# Patient Record
Sex: Male | Born: 1945 | Race: Black or African American | Hispanic: No | Smoking: Former smoker
Health system: Southern US, Community
[De-identification: ages and names within clinical notes are randomized; demographics above are authoritative.]

## PROBLEM LIST (undated history)

## (undated) DIAGNOSIS — E7800 Pure hypercholesterolemia, unspecified: Secondary | ICD-10-CM

## (undated) DIAGNOSIS — I480 Paroxysmal atrial fibrillation: Secondary | ICD-10-CM

## (undated) DIAGNOSIS — Z9689 Presence of other specified functional implants: Secondary | ICD-10-CM

## (undated) DIAGNOSIS — F419 Anxiety disorder, unspecified: Secondary | ICD-10-CM

## (undated) DIAGNOSIS — I429 Cardiomyopathy, unspecified: Secondary | ICD-10-CM

## (undated) DIAGNOSIS — J111 Influenza due to unidentified influenza virus with other respiratory manifestations: Secondary | ICD-10-CM

## (undated) DIAGNOSIS — N183 Chronic kidney disease, stage 3 unspecified: Secondary | ICD-10-CM

## (undated) DIAGNOSIS — I2510 Atherosclerotic heart disease of native coronary artery without angina pectoris: Secondary | ICD-10-CM

## (undated) DIAGNOSIS — N529 Male erectile dysfunction, unspecified: Secondary | ICD-10-CM

## (undated) DIAGNOSIS — D369 Benign neoplasm, unspecified site: Secondary | ICD-10-CM

## (undated) DIAGNOSIS — F32A Depression, unspecified: Secondary | ICD-10-CM

## (undated) DIAGNOSIS — E669 Obesity, unspecified: Secondary | ICD-10-CM

## (undated) DIAGNOSIS — I10 Essential (primary) hypertension: Secondary | ICD-10-CM

## (undated) DIAGNOSIS — M109 Gout, unspecified: Secondary | ICD-10-CM

## (undated) DIAGNOSIS — K219 Gastro-esophageal reflux disease without esophagitis: Secondary | ICD-10-CM

## (undated) DIAGNOSIS — I472 Ventricular tachycardia: Secondary | ICD-10-CM

## (undated) DIAGNOSIS — E119 Type 2 diabetes mellitus without complications: Secondary | ICD-10-CM

## (undated) DIAGNOSIS — N400 Enlarged prostate without lower urinary tract symptoms: Secondary | ICD-10-CM

## (undated) DIAGNOSIS — N186 End stage renal disease: Secondary | ICD-10-CM

## (undated) DIAGNOSIS — F17201 Nicotine dependence, unspecified, in remission: Secondary | ICD-10-CM

## (undated) DIAGNOSIS — I219 Acute myocardial infarction, unspecified: Secondary | ICD-10-CM

## (undated) DIAGNOSIS — R42 Dizziness and giddiness: Secondary | ICD-10-CM

## (undated) DIAGNOSIS — G629 Polyneuropathy, unspecified: Secondary | ICD-10-CM

## (undated) DIAGNOSIS — M1990 Unspecified osteoarthritis, unspecified site: Secondary | ICD-10-CM

## (undated) DIAGNOSIS — K649 Unspecified hemorrhoids: Secondary | ICD-10-CM

## (undated) DIAGNOSIS — I209 Angina pectoris, unspecified: Secondary | ICD-10-CM

## (undated) DIAGNOSIS — I4729 Other ventricular tachycardia: Secondary | ICD-10-CM

## (undated) DIAGNOSIS — F329 Major depressive disorder, single episode, unspecified: Secondary | ICD-10-CM

## (undated) HISTORY — PX: THUMB AMPUTATION: SHX804

## (undated) HISTORY — PX: JOINT REPLACEMENT: SHX530

## (undated) HISTORY — DX: Chronic kidney disease, stage 3 unspecified: N18.30

## (undated) HISTORY — DX: Unspecified hemorrhoids: K64.9

## (undated) HISTORY — DX: Ventricular tachycardia: I47.2

## (undated) HISTORY — PX: CORONARY ANGIOPLASTY: SHX604

## (undated) HISTORY — DX: Nicotine dependence, unspecified, in remission: F17.201

## (undated) HISTORY — DX: Essential (primary) hypertension: I10

## (undated) HISTORY — DX: Male erectile dysfunction, unspecified: N52.9

## (undated) HISTORY — DX: Chronic kidney disease, stage 3 (moderate): N18.3

## (undated) HISTORY — PX: HERNIA REPAIR: SHX51

## (undated) HISTORY — DX: Obesity, unspecified: E66.9

## (undated) HISTORY — DX: Unspecified osteoarthritis, unspecified site: M19.90

## (undated) HISTORY — PX: CARDIAC CATHETERIZATION: SHX172

## (undated) HISTORY — DX: Benign prostatic hyperplasia without lower urinary tract symptoms: N40.0

## (undated) HISTORY — DX: Benign neoplasm, unspecified site: D36.9

## (undated) HISTORY — DX: Gout, unspecified: M10.9

## (undated) HISTORY — DX: Other ventricular tachycardia: I47.29

---

## 2001-06-05 ENCOUNTER — Encounter: Payer: Self-pay | Admitting: *Deleted

## 2001-06-05 ENCOUNTER — Ambulatory Visit (HOSPITAL_COMMUNITY): Admission: RE | Admit: 2001-06-05 | Discharge: 2001-06-05 | Payer: Self-pay | Admitting: *Deleted

## 2001-07-30 ENCOUNTER — Encounter: Payer: Self-pay | Admitting: Family Medicine

## 2001-07-30 ENCOUNTER — Ambulatory Visit (HOSPITAL_COMMUNITY): Admission: RE | Admit: 2001-07-30 | Discharge: 2001-07-30 | Payer: Self-pay | Admitting: Family Medicine

## 2001-10-09 ENCOUNTER — Encounter (HOSPITAL_COMMUNITY): Admission: RE | Admit: 2001-10-09 | Discharge: 2001-11-08 | Payer: Self-pay | Admitting: Neurosurgery

## 2001-11-09 ENCOUNTER — Encounter: Admission: RE | Admit: 2001-11-09 | Discharge: 2001-11-09 | Payer: Self-pay | Admitting: Neurosurgery

## 2002-03-22 ENCOUNTER — Inpatient Hospital Stay (HOSPITAL_COMMUNITY): Admission: RE | Admit: 2002-03-22 | Discharge: 2002-03-30 | Payer: Self-pay | Admitting: Neurosurgery

## 2002-03-31 ENCOUNTER — Emergency Department (HOSPITAL_COMMUNITY): Admission: EM | Admit: 2002-03-31 | Discharge: 2002-03-31 | Payer: Self-pay | Admitting: Emergency Medicine

## 2002-03-31 ENCOUNTER — Encounter: Payer: Self-pay | Admitting: Emergency Medicine

## 2003-03-07 ENCOUNTER — Ambulatory Visit (HOSPITAL_COMMUNITY): Admission: RE | Admit: 2003-03-07 | Discharge: 2003-03-07 | Payer: Self-pay | Admitting: Internal Medicine

## 2003-03-07 ENCOUNTER — Encounter: Payer: Self-pay | Admitting: Internal Medicine

## 2003-03-25 ENCOUNTER — Ambulatory Visit (HOSPITAL_COMMUNITY): Admission: RE | Admit: 2003-03-25 | Discharge: 2003-03-25 | Payer: Self-pay | Admitting: Urology

## 2004-08-21 ENCOUNTER — Ambulatory Visit (HOSPITAL_COMMUNITY): Admission: RE | Admit: 2004-08-21 | Discharge: 2004-08-21 | Payer: Self-pay | Admitting: Internal Medicine

## 2005-01-22 ENCOUNTER — Ambulatory Visit (HOSPITAL_COMMUNITY): Admission: RE | Admit: 2005-01-22 | Discharge: 2005-01-22 | Payer: Self-pay | Admitting: Family Medicine

## 2005-04-27 ENCOUNTER — Encounter: Admission: RE | Admit: 2005-04-27 | Discharge: 2005-04-27 | Payer: Self-pay | Admitting: Neurosurgery

## 2005-05-23 ENCOUNTER — Encounter (HOSPITAL_COMMUNITY): Admission: RE | Admit: 2005-05-23 | Discharge: 2005-06-22 | Payer: Self-pay | Admitting: Neurosurgery

## 2005-07-11 ENCOUNTER — Ambulatory Visit: Payer: Self-pay | Admitting: Cardiology

## 2005-07-31 ENCOUNTER — Ambulatory Visit: Payer: Self-pay | Admitting: *Deleted

## 2005-10-08 ENCOUNTER — Encounter: Admission: RE | Admit: 2005-10-08 | Discharge: 2005-10-08 | Payer: Self-pay | Admitting: Neurosurgery

## 2005-10-11 ENCOUNTER — Ambulatory Visit: Payer: Self-pay | Admitting: *Deleted

## 2005-12-23 HISTORY — PX: BACK SURGERY: SHX140

## 2006-01-20 ENCOUNTER — Ambulatory Visit (HOSPITAL_COMMUNITY): Admission: RE | Admit: 2006-01-20 | Discharge: 2006-01-20 | Payer: Self-pay | Admitting: Neurosurgery

## 2006-03-07 ENCOUNTER — Ambulatory Visit: Payer: Self-pay | Admitting: Cardiology

## 2006-03-10 ENCOUNTER — Inpatient Hospital Stay (HOSPITAL_COMMUNITY): Admission: RE | Admit: 2006-03-10 | Discharge: 2006-03-14 | Payer: Self-pay | Admitting: Neurosurgery

## 2006-08-04 ENCOUNTER — Encounter (INDEPENDENT_AMBULATORY_CARE_PROVIDER_SITE_OTHER): Payer: Self-pay | Admitting: *Deleted

## 2006-08-04 LAB — CONVERTED CEMR LAB
AST: 14 units/L
Albumin: 4 g/dL
BUN: 18 mg/dL
CO2: 23 meq/L
Calcium: 9 mg/dL
Glucose, Bld: 108 mg/dL
HCT: 37.4 %
Hemoglobin: 12.4 g/dL
MCV: 96.1 fL
Potassium: 4.1 meq/L
TSH: 0.69 microintl units/mL
Triglycerides: 115 mg/dL

## 2007-01-08 ENCOUNTER — Ambulatory Visit: Payer: Self-pay | Admitting: Cardiology

## 2007-05-04 ENCOUNTER — Encounter (INDEPENDENT_AMBULATORY_CARE_PROVIDER_SITE_OTHER): Payer: Self-pay | Admitting: Specialist

## 2007-05-04 ENCOUNTER — Ambulatory Visit (HOSPITAL_COMMUNITY): Admission: RE | Admit: 2007-05-04 | Discharge: 2007-05-04 | Payer: Self-pay | Admitting: Podiatry

## 2008-02-16 ENCOUNTER — Ambulatory Visit: Payer: Self-pay | Admitting: Cardiology

## 2008-07-13 ENCOUNTER — Ambulatory Visit (HOSPITAL_COMMUNITY): Admission: RE | Admit: 2008-07-13 | Discharge: 2008-07-13 | Payer: Self-pay | Admitting: Internal Medicine

## 2009-01-11 ENCOUNTER — Ambulatory Visit (HOSPITAL_COMMUNITY): Admission: RE | Admit: 2009-01-11 | Discharge: 2009-01-11 | Payer: Self-pay | Admitting: Internal Medicine

## 2009-02-15 ENCOUNTER — Emergency Department (HOSPITAL_COMMUNITY): Admission: EM | Admit: 2009-02-15 | Discharge: 2009-02-15 | Payer: Self-pay | Admitting: Emergency Medicine

## 2009-02-17 ENCOUNTER — Ambulatory Visit: Payer: Self-pay | Admitting: Cardiology

## 2009-02-17 ENCOUNTER — Encounter (HOSPITAL_COMMUNITY): Admission: RE | Admit: 2009-02-17 | Discharge: 2009-03-19 | Payer: Self-pay | Admitting: Cardiology

## 2009-02-23 ENCOUNTER — Ambulatory Visit: Payer: Self-pay | Admitting: Cardiology

## 2009-02-25 ENCOUNTER — Emergency Department (HOSPITAL_COMMUNITY): Admission: EM | Admit: 2009-02-25 | Discharge: 2009-02-25 | Payer: Self-pay | Admitting: Emergency Medicine

## 2009-03-08 ENCOUNTER — Ambulatory Visit: Payer: Self-pay | Admitting: Cardiology

## 2009-03-30 ENCOUNTER — Encounter: Payer: Self-pay | Admitting: Cardiology

## 2009-03-30 ENCOUNTER — Ambulatory Visit (HOSPITAL_COMMUNITY): Admission: RE | Admit: 2009-03-30 | Discharge: 2009-03-30 | Payer: Self-pay | Admitting: Cardiology

## 2009-03-30 ENCOUNTER — Ambulatory Visit: Payer: Self-pay | Admitting: Cardiology

## 2009-04-21 ENCOUNTER — Ambulatory Visit: Payer: Self-pay | Admitting: Cardiology

## 2009-06-20 ENCOUNTER — Encounter (INDEPENDENT_AMBULATORY_CARE_PROVIDER_SITE_OTHER): Payer: Self-pay | Admitting: *Deleted

## 2009-06-27 DIAGNOSIS — M1990 Unspecified osteoarthritis, unspecified site: Secondary | ICD-10-CM | POA: Insufficient documentation

## 2009-07-06 ENCOUNTER — Encounter (INDEPENDENT_AMBULATORY_CARE_PROVIDER_SITE_OTHER): Payer: Self-pay | Admitting: *Deleted

## 2009-08-08 ENCOUNTER — Encounter: Payer: Self-pay | Admitting: Cardiology

## 2010-05-11 ENCOUNTER — Encounter (INDEPENDENT_AMBULATORY_CARE_PROVIDER_SITE_OTHER): Payer: Self-pay | Admitting: *Deleted

## 2010-05-15 ENCOUNTER — Ambulatory Visit: Payer: Self-pay | Admitting: Cardiology

## 2010-05-15 DIAGNOSIS — N529 Male erectile dysfunction, unspecified: Secondary | ICD-10-CM | POA: Insufficient documentation

## 2010-05-17 ENCOUNTER — Encounter: Payer: Self-pay | Admitting: Cardiology

## 2010-05-24 ENCOUNTER — Encounter (INDEPENDENT_AMBULATORY_CARE_PROVIDER_SITE_OTHER): Payer: Self-pay | Admitting: *Deleted

## 2010-05-24 LAB — CONVERTED CEMR LAB
Albumin: 4.1 g/dL (ref 3.5–5.2)
Alkaline Phosphatase: 75 units/L (ref 39–117)
BUN: 32 mg/dL — ABNORMAL HIGH (ref 6–23)
CO2: 21 meq/L (ref 19–32)
Calcium: 9.2 mg/dL (ref 8.4–10.5)
Chloride: 105 meq/L (ref 96–112)
Eosinophils Relative: 5 % (ref 0–5)
Glucose, Bld: 128 mg/dL — ABNORMAL HIGH (ref 70–99)
HCT: 36.1 % — ABNORMAL LOW (ref 39.0–52.0)
Hemoglobin: 11.8 g/dL — ABNORMAL LOW (ref 13.0–17.0)
Lymphocytes Relative: 20 % (ref 12–46)
Lymphs Abs: 1.2 10*3/uL (ref 0.7–4.0)
Monocytes Absolute: 0.7 10*3/uL (ref 0.1–1.0)
Monocytes Relative: 11 % (ref 3–12)
Neutro Abs: 3.8 10*3/uL (ref 1.7–7.7)
Potassium: 4.2 meq/L (ref 3.5–5.3)
RBC: 3.85 M/uL — ABNORMAL LOW (ref 4.22–5.81)
RDW: 14.4 % (ref 11.5–15.5)
Sodium: 142 meq/L (ref 135–145)
Total Protein: 7.4 g/dL (ref 6.0–8.3)
WBC: 5.9 10*3/uL (ref 4.0–10.5)

## 2010-06-20 ENCOUNTER — Telehealth (INDEPENDENT_AMBULATORY_CARE_PROVIDER_SITE_OTHER): Payer: Self-pay

## 2010-06-22 ENCOUNTER — Telehealth (INDEPENDENT_AMBULATORY_CARE_PROVIDER_SITE_OTHER): Payer: Self-pay

## 2010-08-03 ENCOUNTER — Ambulatory Visit: Payer: Self-pay | Admitting: Cardiology

## 2010-12-20 ENCOUNTER — Encounter (INDEPENDENT_AMBULATORY_CARE_PROVIDER_SITE_OTHER): Payer: Self-pay | Admitting: *Deleted

## 2011-01-13 ENCOUNTER — Encounter: Payer: Self-pay | Admitting: Family Medicine

## 2011-01-24 NOTE — Letter (Signed)
Summary: [place] [facility], general practice at [place]. [address] [place] [address]   Phone: [phone]  Fax: [phone]     May 24, 2010 MRN: [id number]   [name] [place], [place]  [address]      YOUR LAB WORK IS DUE   __________JULY 5, 2011_______________________________  Please go to [facility], located across the street from [facility] on the second floor.  Hours are Monday - Friday 7am until 7:30pm         Saturday 8am until 12noon    __  DO NOT EAT OR DRINK AFTER MIDNIGHT EVENING PRIOR TO LABWORK  _X_ YOUR LABWORK IS NOT FASTING --YOU MAY EAT PRIOR TO LABWORK

## 2011-01-24 NOTE — Miscellaneous (Signed)
Summary: labs cbcd,cmp,lipids,tsh,t4,08/04/2006  Clinical Lists Changes  Observations: Added new observation of CALCIUM: 9.0 mg/dL (08/04/2006 10:14) Added new observation of ALBUMIN: 4.0 g/dL (08/04/2006 10:14) Added new observation of PROTEIN, TOT: 7.1 g/dL (08/04/2006 10:14) Added new observation of SGPT (ALT): 18 units/L (08/04/2006 10:14) Added new observation of SGOT (AST): 14 units/L (08/04/2006 10:14) Added new observation of ALK PHOS: 74 units/L (08/04/2006 10:14) Added new observation of CREATININE: 1.12 mg/dL (08/04/2006 10:14) Added new observation of BUN: 18 mg/dL (08/04/2006 10:14) Added new observation of BG RANDOM: 108 mg/dL (08/04/2006 10:14) Added new observation of CO2 PLSM/SER: 23 meq/L (08/04/2006 10:14) Added new observation of CL SERUM: 107 meq/L (08/04/2006 10:14) Added new observation of K SERUM: 4.1 meq/L (08/04/2006 10:14) Added new observation of NA: 141 meq/L (08/04/2006 10:14) Added new observation of LDL: 84 mg/dL (08/04/2006 10:14) Added new observation of HDL: 30 mg/dL (08/04/2006 10:14) Added new observation of TRIGLYC TOT: 115 mg/dL (08/04/2006 10:14) Added new observation of CHOLESTEROL: 137 mg/dL (08/04/2006 10:14) Added new observation of PLATELETK/UL: 182 K/uL (08/04/2006 10:14) Added new observation of MCV: 96.1 fL (08/04/2006 10:14) Added new observation of HCT: 37.4 % (08/04/2006 10:14) Added new observation of HGB: 12.4 g/dL (08/04/2006 10:14) Added new observation of WBC COUNT: 5.2 10*3/microliter (08/04/2006 10:14) Added new observation of TSH: 0.690 microintl units/mL (08/04/2006 10:14) Added new observation of T4, FREE: 6.8 ng/dL (08/04/2006 10:14) Added new observation of T3 FREE: 94.8 pg/mL (08/04/2006 10:14)

## 2011-01-24 NOTE — Progress Notes (Signed)
Summary: RX question  Medications Added LEVITRA 20 MG TABS (VARDENAFIL HCL) take 1 tablet by mouth as needed       Phone Note Call from Patient   Caller: Patient Reason for Call: Talk to Nurse Summary of Call: Pt states that the RX for Cialis called in was too expensive and wants to know if he can have Levitra called into [facility] in [place] b/c it is cheaper/tg Initial call taken by: Alphonsus Sias [facility],  June 22, 2010 9:39 AM  Follow-up for Phone Call        No answer and no way to leave message. Will continue to call. Follow-up by: Jeani Hawking Via LPN,  June 23, 9727 20:60 PM  Additional Follow-up for Phone Call Additional follow up Details #1::        Pt. wants to try Levitra due to cost of Cialis. Please advise. Additional Follow-up by: Jeani Hawking Via LPN,  June 22, 1560 5:37 PM    Additional Follow-up for Phone Call Additional follow up Details #2::    Levitra 20 mg tabs, #5.  Refill as needed  Sig: one as needed Instructions to patient-try 1/2 tab initially; if unsuccessful-> one tablet.   Additional Follow-up for Phone Call Additional follow up Details #3:: Details for Additional Follow-up Action Taken: Unable to leave message on machine. Will continue to call. Additional Follow-up by: Jeani Hawking Via LPN,  June 29, 9431 76:14 AM  New/Updated Medications: LEVITRA 20 MG TABS (VARDENAFIL HCL) take 1 tablet by mouth as needed Prescriptions: CAPTOPRIL 50 MG TABS (CAPTOPRIL) TAKE 1 TAB three times a day  #90 x 6   Entered by:   Tye Savoy RN   Authorized by:   Yehuda Savannah, MD, [facility]   Signed by:   Tye Savoy RN on 06/29/2010   Method used:   Electronically to        [facility] (retail)       [address]       [place], [place]  [address]       Ph: [phone] or [phone]       Fax: [phone]   RxID:   [id number] LEVITRA 20 MG TABS (VARDENAFIL HCL) take 1 tablet by mouth as needed  #5 x 0   Entered by:   Jeani Hawking Via LPN   Authorized by:    Yehuda Savannah, MD, [facility]   Signed by:   Jeani Hawking Via LPN on 31/11/1623   Method used:   Electronically to        [facility] (retail)       [address]       [place], [place]  [address]       Ph: [phone] or [phone]       Fax: [phone]   RxID:   [id number]  No answer and no way to leave message. Will continue to call.    Jeani Hawking Via LPN  July 02, 760 5:18 AM

## 2011-01-24 NOTE — Assessment & Plan Note (Signed)
Medications Added ASPIR-LOW 81 MG TBEC (ASPIRIN) take 1 tab daily FUROSEMIDE 40 MG TABS (FUROSEMIDE) take 3 tabs 1 time daily IBUPROFEN 800 MG TABS (IBUPROFEN) take as needed ALPRAZOLAM 1 MG TABS (ALPRAZOLAM) take 1/2 tab q8hrs LORTAB 10-500 MG TABS (HYDROCODONE-ACETAMINOPHEN) take as needed CIALIS 20 MG TABS (TADALAFIL) Take 1 tablet by mouth once a day      Allergies Added: NKDA  Visit Type:  Follow-up Primary Provider:  Dr. Kerin Perna   History of Present Illness: [name] returns to the office for continued assessment and treatment of hypertension and hypertensive heart disease.  Since his last visit, he has done well from a medical standpoint.  He has not required emergency medical care.  He is exercising daily, performing water aerobics, without difficulty.  This has led to improvement in his chronic low back pain as well.  Unfortunately, his wife suffered a fatal CVA approximately 7 months ago.  Mr. Harr is still concerned about this and seems to feel that her death may have resulted from a medical error.  Overall he has felt fine.  Lifestyle is relatively sedentary, but he denies dyspnea, orthopnea, PND, chest pain, lightheadedness and syncope.  Current Medications (verified): 1)  Atenolol 50 Mg Tabs (Atenolol) .... Take 1 Tab Daily 2)  Captopril 50 Mg Tabs (Captopril) .... Take 1 Tab Three Times A Day 3)  Verapamil Hcl 80 Mg Tabs (Verapamil Hcl) .... Take 1 Tablet Three Times A Day 4)  Chlorthalidone 25 Mg Tabs (Chlorthalidone) .... Take One Tablet By Mouth Daily 5)  Aspir-Low 81 Mg Tbec (Aspirin) .... Take 1 Tab Daily 6)  Furosemide 40 Mg Tabs (Furosemide) .... Take 3 Tabs 1 Time Daily 7)  Ibuprofen 800 Mg Tabs (Ibuprofen) .... Take As Needed 8)  Alprazolam 1 Mg Tabs (Alprazolam) .... Take 1/2 Tab Q8hrs 9)  Lortab 10-500 Mg Tabs (Hydrocodone-Acetaminophen) .... Take As Needed 10)  Cialis 20 Mg Tabs (Tadalafil) .... Take 1 Tablet By Mouth Once A Day  Allergies  (verified): No Known Drug Allergies  Past History:  PMH, FH, and Social History reviewed and updated.  Past Medical History: Chest heart failure with normal EF and normal coronary [phone]);       stress nuclear in 2/10-EF of 41%; no scarring or ischemia; diaphragmatic attenuation.  Pedal edema HYPERTENSION (exercise-induced) Nonsustained VT-exercise-induced Tobacco abuse: 30 pack years; discontinued 1994 OBESITY-MORBID DEGENERATIVE JOINT DISEASE (ICD-715.90) Possible benign prostatic hypertrophy; nocturia and Erectile dysfunction    Review of Systems       See history of present illness.  Vital Signs:  Patient profile:   65 year old male Height:      75 inches Weight:      304 pounds BMI:     38.13 Pulse rate:   48 / minute BP sitting:   135 / 68  (right arm)  Vitals Entered By: Doretha Sou, CNA (May 15, 2010 2:51 PM)  Physical Exam  General:    Boisterous overweight gentleman in no acute distress:   Weight-304, 12 pounds less than at his last visit Neck-No JVD; no carotid bruits: Lungs-No tachypnea, no rales; no rhonchi; no wheezes: Cardiovascular-normal PMI; normal S1 and S2; S4 present Abdomen-BS normal; soft and non-tender without masses or organomegaly:  Musculoskeletal-No deformities, no cyanosis or clubbing: Neurologic-Normal cranial nerves; symmetric strength and tone:  Skin-Warm, no significant lesions: Extremities-Nl distal pulses;1+ pretibial and ankle  edema:     Impression & Recommendations:  Problem # 1:  HYPERTENSION (ICD-401.9) Blood pressure  is under excellent control; current medications will be continued and appropriate laboratory studies obtained.  Problem # 2:  ERECTILE DYSFUNCTION, ORGANIC (ICD-607.84) Cialis samples provided at patient's request.  Problem # 3:  [place] EF (ICD-428.1) Patient has been compensated for many years since blood pressure and volume status have been controlled.  He was  congratulated on a greater than 10 pound weight loss, which also may be contributing to his heightened sense of well-being.  I will plan to reassess this very nice gentleman in one year.  I offered to review his wife's medical records with him at his convenience.  Other Orders: T-Comprehensive Metabolic Panel ([id number]) T-CBC w/Diff [phone])  Patient Instructions: 1)  Your physician recommends that you schedule a follow-up appointment in: 1 year 2)  Your physician recommends that you return for lab work in: today 3)  Your physician recommends that you continue on your current medications as directed. Please refer to the Current Medication list given to you today. Prescriptions: CIALIS 20 MG TABS (TADALAFIL) Take 1 tablet by mouth once a day  #3 x 0   Entered by:   Tye Savoy RN   Authorized by:   Yehuda Savannah, MD, [facility]   Signed by:   Tye Savoy RN on 05/15/2010   Method used:   Print then Give to Patient   RxID:   [id number]

## 2011-01-24 NOTE — Assessment & Plan Note (Signed)
Summary: TO [facility] RECORDS/TG    Primary Provider:  Dr. Kerin Perna   History of Present Illness: Mr. Cannan Beeck and I met at his request to review the medical records and his wife, who passed away while a patient at [facility].  According to my review, she suffered fatal complications of bacterial endocarditis with pulmonary, renal and predominantly neurologic compromise.  Medical care appeared appropriate.  Patient was most concerned with this sudden deterioration and his wife status when it was thought that she was close to hospital discharge.  This was the result of a cerebral embolism.  I explained that this was a complication of her underlying disease that was unpredictable and unpreventable.  He appeared sad but accepting of my explanation.  There was no charge for this discussion.  Allergies: No Known Drug Allergies

## 2011-01-24 NOTE — Letter (Signed)
Summary: [place] [facility] Work Doctor, general practice at [place]. [address], [place] [address]   Phone: [phone]  Fax: [phone]     December 20, 2010 MRN: [id number]   [name] [address] [place], [place]  [address]      YOUR LAB WORK IS DUE   December 25, 2010  Please go to [facility], located across the street from [facility] on the second floor.  Hours are Monday - Friday 7am until 7:30pm         Saturday 8am until 12noon      _X_ YOUR LABWORK IS NOT FASTING --YOU MAY EAT PRIOR TO LABWORK

## 2011-01-24 NOTE — Progress Notes (Signed)
Summary: RX called in   Phone Note Call from Patient   Caller: Patient Summary of Call: pt wants GF for Cialis called to [facility]/ pt states he only needs about 10 pills/tg Initial call taken by: Alphonsus Sias [facility],  June 20, 2010 11:55 AM    Prescriptions: CIALIS 20 MG TABS (TADALAFIL) Take 1 tablet by mouth once a day  #5 x 1   Entered by:   Jeani Hawking Via LPN   Authorized by:   Yehuda Savannah, MD, [facility]   Signed by:   Jeani Hawking Via LPN on 84/21/0312   Method used:   Electronically to        [facility] (retail)       [address]       [place], [place]  [address]       Ph: [phone] or [phone]       Fax: [phone]   RxID:   [id number]

## 2011-01-24 NOTE — Letter (Signed)
Summary: [place] [facility], general practice at [place]. [address], [place] [address]   Phone: [phone]  Fax: [phone]      May 24, 2010 MRN: [id number]   DHAIRYA CORALES [address] [place], [place]  [address]   Dear [name],  Your test ordered by Rande Lawman has been reviewed by your physician (or physician assistant) and was found to be normal or stable. Your physician (or physician assistant) felt no changes were needed at this time.  ____ Echocardiogram  ____ Cardiac Stress Test  __x__ Lab Work  ____ Peripheral vascular study of arms, legs or neck  ____ CT scan or X-ray  ____ Lung or Breathing test  ____ Other:  Based on your lab results, please discontinue your chlorthalidone at this time.  We will repeat labs in 1 mont, per Dr. Lattie Haw.  Enclosed is a copy of your labwork for your records.  Thank you, Katana Berthold Baird Cancer RN    Jacqulyn Ducking, MD, [facility] Gamer.C.Renella Cunas, MD, F.A.C.C Cristopher Peru, MD, F.A.C.C Rozann Lesches, MD, F.A.C.C Jenkins Rouge, MD, Leana Gamer.C.C

## 2011-02-14 ENCOUNTER — Emergency Department (HOSPITAL_COMMUNITY)
Admission: EM | Admit: 2011-02-14 | Discharge: 2011-02-14 | Disposition: A | Discharge status: Home / Self Care | Payer: Medicare Other | Attending: Emergency Medicine | Admitting: Emergency Medicine

## 2011-02-14 ENCOUNTER — Emergency Department (HOSPITAL_COMMUNITY)
Admission: EM | Admit: 2011-02-14 | Discharge: 2011-02-15 | Disposition: A | Discharge status: Home / Self Care | Payer: Medicare Other | Attending: Emergency Medicine | Admitting: Emergency Medicine

## 2011-02-14 DIAGNOSIS — I2510 Atherosclerotic heart disease of native coronary artery without angina pectoris: Secondary | ICD-10-CM | POA: Insufficient documentation

## 2011-02-14 DIAGNOSIS — I10 Essential (primary) hypertension: Secondary | ICD-10-CM | POA: Insufficient documentation

## 2011-02-14 DIAGNOSIS — Z79899 Other long term (current) drug therapy: Secondary | ICD-10-CM | POA: Insufficient documentation

## 2011-02-14 DIAGNOSIS — M545 Low back pain, unspecified: Secondary | ICD-10-CM | POA: Insufficient documentation

## 2011-02-15 ENCOUNTER — Emergency Department (HOSPITAL_COMMUNITY): Payer: Medicare Other

## 2011-02-15 LAB — URINALYSIS, ROUTINE W REFLEX MICROSCOPIC
Specific Gravity, Urine: 1.015 (ref 1.005–1.030)
Urine Glucose, Fasting: NEGATIVE mg/dL
Urobilinogen, UA: 0.2 mg/dL (ref 0.0–1.0)
pH: 5.5 (ref 5.0–8.0)

## 2011-02-15 LAB — URINE MICROSCOPIC-ADD ON

## 2011-04-09 LAB — POCT CARDIAC MARKERS
CKMB, poc: <1 ng/mL — ABNORMAL LOW (ref 1.0–8.0)
CKMB, poc: <1 ng/mL — ABNORMAL LOW (ref 1.0–8.0)
Myoglobin, poc: 123 ng/mL (ref 12–200)
Myoglobin, poc: 96.9 ng/mL (ref 12–200)
Troponin i, poc: <0.05 ng/mL (ref 0.00–0.09)
Troponin i, poc: <0.05 ng/mL (ref 0.00–0.09)

## 2011-04-09 LAB — BASIC METABOLIC PANEL
BUN: 12 mg/dL (ref 6–23)
CO2: 27 mEq/L (ref 19–32)
Calcium: 9 mg/dL (ref 8.4–10.5)
Chloride: 105 mEq/L (ref 96–112)
Creatinine, Ser: 1.23 mg/dL (ref 0.4–1.5)
GFR calc Af Amer: >60 mL/min (ref >60)
GFR calc non Af Amer: 59 mL/min — ABNORMAL LOW (ref >60)
Glucose, Bld: 169 mg/dL — ABNORMAL HIGH (ref 70–99)
Potassium: 3.8 mEq/L (ref 3.5–5.1)
Sodium: 140 mEq/L (ref 135–145)

## 2011-04-09 LAB — DIFFERENTIAL
Basophils Absolute: 0 10*3/uL (ref 0.0–0.1)
Basophils Relative: 0 % (ref 0–1)
Eosinophils Absolute: 0.1 10*3/uL (ref 0.0–0.7)
Eosinophils Relative: 2 % (ref 0–5)
Lymphocytes Relative: 17 % (ref 12–46)
Lymphs Abs: 0.9 10*3/uL (ref 0.7–4.0)
Monocytes Absolute: 0.5 10*3/uL (ref 0.1–1.0)
Monocytes Relative: 10 % (ref 3–12)
Neutro Abs: 3.8 10*3/uL (ref 1.7–7.7)
Neutrophils Relative %: 71 % (ref 43–77)

## 2011-04-09 LAB — CBC
HCT: 35.8 % — ABNORMAL LOW (ref 39.0–52.0)
Hemoglobin: 12.3 g/dL — ABNORMAL LOW (ref 13.0–17.0)
MCHC: 34.3 g/dL (ref 30.0–36.0)
MCV: 96.1 fL (ref 78.0–100.0)
Platelets: 148 10*3/uL — ABNORMAL LOW (ref 150–400)
RBC: 3.73 MIL/uL — ABNORMAL LOW (ref 4.22–5.81)
RDW: 13.9 % (ref 11.5–15.5)
WBC: 5.4 10*3/uL (ref 4.0–10.5)

## 2011-04-09 LAB — GLUCOSE, CAPILLARY: Glucose-Capillary: 174 mg/dL — ABNORMAL HIGH (ref 70–99)

## 2011-04-30 ENCOUNTER — Encounter (HOSPITAL_COMMUNITY): Payer: Self-pay

## 2011-04-30 ENCOUNTER — Other Ambulatory Visit (HOSPITAL_COMMUNITY): Payer: Self-pay | Admitting: Internal Medicine

## 2011-04-30 ENCOUNTER — Ambulatory Visit (HOSPITAL_COMMUNITY)
Admission: RE | Admit: 2011-04-30 | Discharge: 2011-04-30 | Disposition: A | Discharge status: Home / Self Care | Payer: Medicare Other | Source: Ambulatory Visit | Attending: Internal Medicine | Admitting: Internal Medicine

## 2011-04-30 DIAGNOSIS — M79609 Pain in unspecified limb: Secondary | ICD-10-CM | POA: Insufficient documentation

## 2011-04-30 DIAGNOSIS — S91109A Unspecified open wound of unspecified toe(s) without damage to nail, initial encounter: Secondary | ICD-10-CM

## 2011-04-30 DIAGNOSIS — R937 Abnormal findings on diagnostic imaging of other parts of musculoskeletal system: Secondary | ICD-10-CM | POA: Insufficient documentation

## 2011-04-30 DIAGNOSIS — X58XXXA Exposure to other specified factors, initial encounter: Secondary | ICD-10-CM | POA: Insufficient documentation
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
## 2011-05-07 NOTE — Letter (Signed)
February 16, 2008    [name], M.D.  [address] [place],  [place] [address]   RE:  [name], [name]  MRN:  [id number]  /  DOB:  1946-05-26   Dear [name]:   [name] returns to the office as scheduled for continued assessment  and treatment of a history of congestive heart failure with preserved  left ventricular systolic function.  This occurred many years ago and  appeared to be related to diastolic dysfunction.  In any case, he has  done very well in recent years.  His principle problems have been  orthopedic.  He was advised to lose weight and has dropped 40 pounds  with significant improvement in back pain and sense of well-being.  He  has erectile dysfunction and is requesting treatment.  He does not use  nitrates.   Current medications include verapamil 80 mg t.i.d., captopril 50 mg  t.i.d., diclofenac 75 mg b.i.d. and Tenormin 50 mg daily.   On exam, pleasant husky gentleman with a halting speech pattern in no  acute distress.  The weight is 287, 41 pounds less than in January 2008.  Blood pressure 120/80, heart rate 52 and regular, respirations 14.  NECK:  No jugular venous distention; normal carotid upstrokes without  bruits.  LUNGS:  Clear.  CARDIAC:  Normal first and second heart sounds.  Minimal systolic  murmur.  ABDOMEN:  Soft and nontender; normal bowel sounds; no bruits; aortic  pulsation not palpable.  EXTREMITIES:  No edema; distal pulses intact.   Recent laboratory is supplied from your office.  Lipid profile,  chemistry profile and CBC were all excellent.   Rhythm strip:  Sinus bradycardia at a rate of 52; borderline first-  degree AV block.   IMPRESSION:  [name] is doing beautifully.  Due to sinus bradycardia,  his dose of Tenormin will be reduced to 25 mg daily.  We provided him  with samples of Cialis.  I will plan see this nice gentleman again in 1  year.    Sincerely,      Cristopher Estimable. Lattie Haw, MD, [facility]  Electronically  Signed    RMR/MedQ  DD: 02/16/2008  DT: 02/16/2008  Job #: [id number]

## 2011-05-07 NOTE — Letter (Signed)
February 23, 2009    Elsie [address], [place]  [place], [place]  [address]   RE:  [name], [name]  MRN:  [id number]  /  DOB:  05/04/1946   Dear Rush Landmark:   [name] is seen in the office after recent emergency department  visit.  The ER doctor evaluated him for chest discomfort, but the  patient claims weakness after exercising in the pool.  He ate a candy  bar and rapidly recovered back towards normal.  He has gained  substantial weight over the past 12 months, which he attributes to  consumption of soft drinks.  Blood pressure control has been good as far  as he knows.  A stress test was ordered from the emergency department  and performed.  It showed no convincing evidence for ischemic heart  disease, but left ventricular systolic function was mildly impaired.   Current medications include verapamil 80 mg t.i.d., captopril 50 mg  t.i.d., and atenolol 50 mg daily.  He uses furosemide on a p.r.n. basis.   PHYSICAL EXAMINATION:  GENERAL:  Pleasant gentleman, in no acute  distress.  VITAL SIGNS:  The weight is 334, 47 pounds more than 1 year ago.  Blood  pressure 200/95, heart rate 60 and regular, respirations 14 and  unlabored.  NECK:  No jugular venous distention.  LUNGS:  Clear.  CARDIAC:  Normal first and second heart sounds.  ABDOMEN:  Soft and nontender; no bruits.  EXTREMITIES:  1+ pretibial edema.   IMPRESSION:  [name] has poor control of hypertension in the setting  of a substantial weight gain.  He will attempt to reduce his caloric  intake including completely eliminating beverages with sugar.  We will  obtain an echocardiogram to reassess left ventricular systolic function,  which was normal on his last echocardiogram 15 years ago.  Chlorthalidone will be added to his medical regime at a dose of 25 mg  daily for some diuresis and improvement in blood pressure control.  He  will recheck some blood pressures at his local pharmacy or here in the  office,  return in 2 weeks for the nurses to check his pressures and then  return to see me in 1 month.    Sincerely,      Cristopher Estimable. Lattie Haw, MD, [facility]  Electronically Signed    RMR/MedQ  DD: 02/23/2009  DT: 02/24/2009  Job #: [id number]

## 2011-05-10 NOTE — Discharge Summary (Signed)
[place]  Patient:    GORGE, ALMANZA Visit Number: [id number] MRN: [id number]          Service Type: EMS Location: [facility] Attending Physician:  Orlie Dakin Dictated by:   [name], M.D. Admit Date:  03/31/2002 Discharge Date: 03/31/2002                             Discharge Summary  BRIEF HISTORY:  The patient is a 65 year old black male, who suffered from intractable back pain.  He failed all treatment modalities short of surgery. I therefore discussed various treatment options with him including a lumbar fusion.  The patient weighed the risks, benefits, and alternatives of surgery and decided to proceed with a lumbar fusion.  For further details of this admission, please refer to the patients typed history and physical.  HOSPITAL COURSE:  I admitted the patient to [facility] on March 22, 2002, with the diagnosis of L3-4 and L4-5 spondylolithesis, degenerative disk disease, stenosis, lumbago lumbar radiculopathy.  On the day of admission, I performed an L3 and L4 laminectomy, posterior lumbar interbody fusion with insertion of bilateral tangent bone dowels, place of titanium pedicle screws and rods (for full details of this operation, please refer to our typed operative note).  POSTOPERATIVE COURSE:  The patients postoperative course was remarkable mainly for an acute flare-up of gout.  We had the [facility] see the patient, and Dr. Dorian Pod and Dr. Alferd Patee saw the patient and helped [place] with the medical management of his gout.  This was of the rate-limiting factor in sending him home.  By March 30, 2002, the patient was afebrile; vital signs were stable.  He was eating well, ambulating well.  His wound was healing well without signs of infection, and his gout flare had improved to the point where he could go home.  He was therefore discharged to home on March 30, 2002.  DISCHARGE  MEDICATIONS: 1. Percocet 10/650 #60, 1 p.o. q.4h. p.r.n. pain, no refills. 2. Valium 5 mg #50, 1 p.o. q.6h. p.r.n. muscle spasm, no refills. 3. The patient was instructed to resume his outpatient regimen and was given    prescriptions for colchicine, etc., for treatment of his gout by    Dr. Jimmye Norman.  DISCHARGE INSTRUCTIONS:  The patient was given written discharge instructions and instructed to follow up with me in four weeks.  FINAL DIAGNOSES: 1. 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 2. Degenerative disease and stenosis lumbago. 3. Gout. 4. Hypertension.  PROCEDURES PERFORMED: 1. L3-4 and L4-5 posterior lumbar interbody fusion.  2. Insertion of bilateral    tangent bone dowels at L3-4 and L4-5. 3. Posterior segmental instrumentation at L3-4 and L5 with placement of soft    [place] titanium pedicle screws and rods. 4. Posterior arthrodesis L3-4 and L4-5 with local morcellized autograft and    ______ bone scaffolding. Dictated by:   [name], M.D. Attending Physician:  Orlie Dakin DD:  04/08/02 TD:  04/10/02 Job: [id number]

## 2011-05-10 NOTE — Consult Note (Signed)
[name], [name]               ACCOUNT NO.:  [id number]   MEDICAL RECORD NO.:  [id number]          PATIENT TYPE:  AMB   LOCATION:  DAY                           FACILITY:  [facility]   PHYSICIAN:  Zella Richer. Berline Lopes, D.P.M.DATE OF BIRTH:  1946-10-18   DATE OF CONSULTATION:  DATE OF DISCHARGE:                                 CONSULTATION   HISTORY OF PRESENT ILLNESS:  [name] is a 65 year old black male that  presents to the office on referral of Dr. 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  The patient relates he has had a longstanding history of  enlargement of the big toe joint, but it has been getting progressively  sore and at the present time, he cannot get into a pair of shoes.  The  patient relates it has been red, hot and swollen and Dr. [name]  diagnosed him with gout and actually placed him on anti-inflammatory  medication in the form of diclofenac.  He has also been taking some  Endocet for the pain, but nothing has really helped.   PREVIOUS HOSPITALIZATION AND SURGERY:  The patient was hospitalized for  back surgery and congestive heart failure.   MEDICATIONS:  Presently, the patient takes the following medications:  Diclofenac, Tenormin, atenolol, Endocet, Captopril, alprazolam and  Verapamil.   ALLERGIES:  The patient denies allergies to medication.   FAMILY HISTORY:  Heart and blood pressure.   SOCIAL HISTORY:  Negative.   PAST MEDICAL HISTORY:  The patient's medical history is positive for  gout, arthritis and hypertension.   PRIMARY CARE DOCTOR:  Dr. [name].   PHYSICAL EXAMINATION:  GENERAL:  The patient is a 65 year old male.  VITAL SIGNS:  He is 6 feet 3 inches, 290 pounds.  His blood pressure was  130/90 and temperature was 98.7.  EXTREMITIES:  Lower extremity exam reveals palpable pedal pulses, both  DP and PT, with spontaneous capillary filling time.  NEUROLOGIC:  Exam essentially  within normal limits.  MUSCULOSKELETAL:  Exam reveals edema and hypertrophy of the medial  eminence of the 1st metatarsal head of the right foot with lateral  deviation of the hallux consistent with hallux valgus deformity.  There  appears to be tophaceous deposits medially.   RADIOLOGIC FINDINGS:  X-rays reveal there is a large soft tissue mass on  the medial aspect of the 1st MTP, probably tophaceous deposit of gouty  arthritis.  He does have hypertrophy of the medial eminence of the 1st  metatarsal head consistent with hallux valgus deformity.   ASSESSMENT:  Hallux valgus deformity with possible tophaceous deposit  with a positive history of gout.   PLAN:  I reviewed both conservative and surgical management with the  patient.  He cannot get into a pair of shoes and so would like to have  the problem surgically corrected.  I reviewed with him surgical  correction which would consistent of an Austin bunionectomy with an  excision of tophaceous deposit.  I reviewed the procedures and  complications to the procedure such as  infection, bone infection,  postoperative pain and swelling, etc.  The patient seems to understand  the same and surgery has been scheduled for May 04, 2007.      Zella Richer Berline Lopes, D.P.M.  Electronically Signed     DBT/MEDQ  D:  05/03/2007  T:  05/04/2007  Job:  [id number]

## 2011-05-10 NOTE — Op Note (Signed)
[place]. [facility]  Patient:    [name], [name] Visit Number: [id number] MRN: [id number]          Service Type: SUR Location: 9242 [id number] Attending Physician:  [name] Dictated by:   [name], M.D. Proc. 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  PROCEDURES: 1. L3-4 and L4-5 posterior lumbar interbody fusion. 2. Insertion of bilateral Tangent bone dowels at L3-4 and L4-5. 3. Posterior segmental instrumentation, L3, L4, and L5, with placement of    Sofamor Danek titanium pedicle screws and rods. 4. Posterolateral arthrodesis, L3-4 and L4-5 bilaterally with local    morcellized autograft bone and Vitoss bond scaffolding.  SURGEON: [name], M.D.  ASSISTANT:  Vickki Muff. Christella Noa, M.D.  ANESTHESIA:  General endotracheal.  ESTIMATED BLOOD LOSS:  600 cc.  SPECIMENS:  None.  DRAINS:  None.  COMPLICATIONS:  None.  BRIEF HISTORY:  The patient is a 65 year old black male who has suffered from incapacitating back pain.  He failed medical management, including time, rest, physical therapy, epidural injections, medications, etc., and therefore weighed the risks, benefits, and alternatives of surgery and decided to proceed with a lumbar fusion after a lumbar MRI demonstrated degenerative disk disease and spondylolisthesis at L3-4 and L4-5.  DESCRIPTION OF PROCEDURE:  The patient was brought to the operating room by the anesthesia team.  General endotracheal anesthesia was induced.  The patient was then turned to the prone position on the chest rolls.  His lumbosacral region was then shaved and prepared with  Betadine scrub and Betadine solution.  Sterile drapes were applied.  I then injected the area to be incised with Marcaine with epinephrine solution and used the scalpel to make a vertical midline incision over the L3-4 and L4-5 interspaces.  I used electrocautery to dissect down to the thoracolumbar fascia and divide the fascia bilaterally, performing a bilateral subperiosteal dissection and stripping the paraspinous musculature from the bilateral spinous processes and laminae of L3, L4, and L5.  I inserted a McCullough retractor for exposure and then obtained the intraoperative radiograph to confirm our location.  I used the high-speed drill to perform bilateral L3 and L4 laminotomies.  I widened the laminotomies with the Kerrison punch, removing the medial aspect of the L3-4 and L4-5 facet joints and removing the bilateral L3-4 and L4-5 ligamentum flavum.  I performed foraminotomies about the bilateral L4 and L5 nerve roots. I then freed up the thecal sac and the nerve roots from the epidural tissue using a Penfield #4 and then carefully retracted the neural structures medially with the Occidental retractor and incised the L3-4 and L4-5 intervertebral disks and performed aggressive diskectomy bilaterally with the pituitary forceps and Scoville and Epstein curettes.  I then turned my attention to the posterior lumbar interbody fusion.  I prepared the vertebral end plates at H9-6 and Q2-2 bilaterally with the rotating cutter, then chisel, and then I carefully retracted the neural structures out of harms way and inserted a 10 x 24 mm Tangent bone dowel bilaterally at L3-4 and a 10 x 26 mm Tangent bone dowel bilaterally at L4-5. I packed  in between and around the bone dowel with Vitoss bone substitute and local morcellized autograft bone, which was obtained during the decompression, thus completing the posterior lumbar interbody fusion.  I now turned my attention to the posterior spinal  instrumentation.  I used electrocautery to expose the bilateral transverse processes at L3, L4, and L5, and then under fluoroscopic guidance I decorticated posterior to the bilateral L3, L4, and L5 pedicles with the high-speed drill.  I then cannulated the pedicles, tapped the pedicles with a 6.5 mm tap, and then I probed anterior to the pedicle with the straight ball probe and noted that the cortex had not been breached.  This was done in a similar fashion at each level.  I inserted a 7.5 x 50 mm pedicle screw at the bilateral L3 and L4 pedicles.  I placed a 7.5 x 50 mm pedicle screw in the left L5 pedicle and a 7.5 x 55 mm pedicle screw in the right L5 pedicle in order to make the screw heads at the same length.  I then connected unilateral pedicle screws to the appropriately contoured 70 mm titanium rod and then secured the screws to the rod using the caps.  I then used the torque tightener to tighten all the caps and then place two crosslinks after using the Leksell rongeur to remove the spinous process in order to allow the crosslinks to be placed.  I tightened the crosslinks appropriately.  This was a good, strong, and well-placed construct.  All the positioning of the pedicle screws was done under fluoroscopic guidance.  I now turned my attention to the posterolateral arthrodesis.  I used the high-speed drill to decorticate the bilateral laminae of L3, the bilateral L3-4 facets, the remainder of the L4 laminae, and the bilateral 4-5 facets as well as the bilateral L3, L4, and L5 transverse processes.  I laid a combination of local morcellized autograft bone and Vitoss bone substitute over these decorticated posterolateral structures, completing the posterolateral arthrodesis.  I then palpated about the thecal sac at L3-4 and the bilateral L4 nerve roots and the thecal sac at L4-5 and the bilateral L5 nerve roots and noted the neural structures were well-decompressed.  I palpated  about the medial aspect of the bilateral L3, L4, and L5 pedicles and noted the cortex had not been breached.  I achieved stringent hemostasis using  bipolar electrocautery.  I then removed the McCullough retractor and reapproximated the patients thoracolumbar fascia with interrupted #1 Vicryl suture, the subcutaneous tissue using interrupted 2-0 Vicryl suture, and the skin with Steri-Strips and benzoin.  The wound was then coated with bacitracin ointment and sterile dressings applied.  The drapes were removed and the patient was subsequently returned to a supine position and was transported to the postanesthesia care unit in stable condition but still intubated.  All sponge, instrument, and needle counts were correct at the end of the case. Dictated by:   [name], M.D. Attending Physician:  Newman Pies D DD:  03/22/02 TD:  03/23/02 Job: [id number]

## 2011-05-10 NOTE — Discharge Summary (Signed)
[name], AL NO.:  [id number]   MEDICAL RECORD NO.:  [id number]          PATIENT TYPE:  INP   LOCATION:  3010                         FACILITY:  [facility]   PHYSICIAN:  [name], M.D.DATE OF BIRTH:  1946-08-25   DATE OF ADMISSION:  03/10/2006  DATE OF DISCHARGE:  03/14/2006                                 DISCHARGE SUMMARY   BRIEF HISTORY:  The patient is a 65 year old black male who I performed an  L3-L4 and 4-5 fusion on several years ago.  The patient did well initially  but over the last year or so he developed increasing back, hip and leg pain.  He failed medical management and was worked up with a lumbar MRI as well as  a lumbar myelogram and CT which demonstrated the patient had developed  significant facet arthroplasty and stenosis at L2-L3.  I discussed the  various treatment options with the patient including surgery.  The patient  has weighed the risks, benefits and alternatives to surgery and decided to  proceed with an L2-L3 decompression and fusion.   For further details of this admission, please refer to typed history and  physical.   HOSPITAL COURSE:  I admitted the patient to [facility] on March 10, 2006.  On day of admission, I performed an L2-L3 decompression and fusion.  The surgery went well.  For full details of this operation, please refer to  typed operative note.   POSTOPERATIVE COURSE:  The patient's postoperative course was unremarkable  except as follows.  The patient developed an increased BUN and creatinine to  27 and 1.9.  Because of this, I asked renal nephrologist to see the patient.  They saw him and made some recommendations and checked a renal ultrasound  which turned out okay.  By March 14, 2006, he was afebrile.  Vital signs  were stable.  He was eating well, ambulating well and requesting discharged  to home.  He was discharged to home on March 14, 2006.   The nephrologist at that point thought he  was okay to be discharged and to  follow up with his primary doctor, Dr. [name] and [name] in [place]  regarding his kidneys.   FINAL DIAGNOSES:  1.  L2-L3 stenosis.  2.  Degenerative disc disease.  3.  Spondylosis.  4.  Lumbar lumbago, lumbar radiculopathy.  5.  Elevated blood urea nitrogen and creatinine.   PROCEDURE:  L2 decompressive laminectomy; L2-L3 posterior lumbar interbody  fusion; insertion of bilateral L2-L3 interbody prosthesis (Capstone PEEK  cages); posterior segmental instrument L2-L5 with Legacy titanium cutting  screws and rods; L2-L3 posterolateral arthrodesis with local morsellized  autograft bone and Vitoss bone graft extender.      [name], M.D.  Electronically Signed     JDJ/MEDQ  D:  04/24/2006  T:  04/25/2006  Job:  [id number]   cc:   [name], M.D.  Fax: [phone]

## 2011-05-10 NOTE — Consult Note (Signed)
NAMENAVARRE, DIANA               ACCOUNT NO.:  [id number]   MEDICAL RECORD NO.:  [id number]          PATIENT TYPE:  INP   LOCATION:  3010                         FACILITY:  [facility]   PHYSICIAN:  Maudie Flakes. Hassell Done, M.D.   DATE OF BIRTH:  March 09, 1946   DATE OF CONSULTATION:  03/12/2006  DATE OF DISCHARGE:                                   CONSULTATION   Mr. Andal is a 65 year old black man admitted on March 19 by Dr. Arnoldo Morale  for lumbar spine surgery.  Surgery lasted about 4-1/2 hours.  Blood pressure  in the operating room usually ran between 354 and 562 systolic.  It  decreased to 563 systolic once.  Creatinine was 1.2 on March 15, 1.6  yesterday (one day postop), 1.9 today.  Renal consult was requested.  Foley  catheter was discontinued yesterday.   PAST MEDICAL HISTORY:  L3-5 surgery 2003, hypertension (30 years),  hypertensive cardiomyopathy (sees [facility]), gout.  He has had  bilateral inguinal hernia repairs.   MEDICATIONS ON ADMISSION:  1.  Diclofenac (Voltaren NSAID) 75/d.  2.  Indomethacin (NSAID) 50 three times daily as needed.  3.  Colchicine 0.6/d as needed.  4.  Atenolol 50/d.  5.  Verapamil 80/d.  6.  Captopril 50 three times daily.  7.  Lasix 40/d.  8.  Xanax as needed.  9.  Oxycodone as needed.   Indocin 50 mg three times daily was given in the hospital for 24 hours but  was stopped yesterday.  Currently in the hospital he is on Atenolol 50/d,  Verapamil 80 three times daily, Captopril 50 three times daily.  Tylox,  Valium, Zofran, morphine and Lasix p.r.n.   SOCIAL HISTORY:  He lives with his wife.  They have been married for 32  years.  They have no children  between them.  He has one child.  He grew up  in [place].  He finished 9th grade. He worked in [employer]  for 30 years, then [employer] for about five years prior to disability from  his back surgery four years ago.  He has a 30 pack year history of cigarette  smoking, none in  the last 10 years.  He does not drink alcohol.   FAMILY HISTORY:  His mother died age 59.  She was on dialysis in [place] prior  to her death.  His father died age 8 of brain tumor.  He has three  brothers and one sister, all of whom are healthy.   REVIEW OF SYSTEMS:  Back pain, weak legs; urinary stream is okay.  He has no  gross hematuria, no renal colic, no angina, no claudication, no melena, no  hematochezia, no weight gain, no weight loss, no cold or heat intolerance.   PHYSICAL EXAMINATION:  GENERAL:  He is awake, alert and courteous.  His wife  and sister-in-law are at the bedside.  VITAL SIGNS:  Temperature 98.2 (was 101.4 yesterday at 6 p.m.), pulse 60,  respirations 18, blood pressure 110/50.  MOUTH:  No inflammation, no exudate, no oral ulcers.  Tongue not dry.  CHEST:  Clear.  HEART:  Regular rate and rhythm, no rubs heard.  ABDOMEN:  Nontender.  Bladder not palpable.  No bruits are heard.  EXTREMITIES:  No edema.  GU:  Uncircumcised penis, testes are descended bilaterally.  NEUROLOGIC:  Right-handed.  Strength equal.   LABORATORY DATA:  Hemoglobin 11.9, white blood cells 8600, platelet count  173,000.  Sodium 138, potassium 4.2, chloride 104, CO2 28, BUN 27,  creatinine 1.9, glucose 168, calcium 8.   IMPRESSION:  Acute renal failure, probable a combination of hemodynamic  (lowered blood pressure) plus NSAID (Indocin) and ACE inhibitor.   RECOMMENDATIONS:  IV fluids normal saline 200 mL an hour x5, then 80 mL an  hour.  Hold Captopril.  Discontinue Indocin, diclofenac.  Check urinalysis,  check renal ultrasound if creatinine still elevated tomorrow and Foley  catheter should be replaced.  Check blood cultures if temperature rises or  white count rises.           ______________________________  Maudie Flakes. Hassell Done, M.D.     RFF/MEDQ  D:  03/12/2006  T:  03/14/2006  Job:  [id number]

## 2011-05-10 NOTE — Op Note (Signed)
[name], [name]               ACCOUNT NO.:  [id number]   MEDICAL RECORD NO.:  [id number]          PATIENT TYPE:  AMB   LOCATION:  DAY                           FACILITY:  [facility]   PHYSICIAN:  Zella Richer. Berline Lopes, D.P.M.DATE OF BIRTH:  March 14, 1946   DATE OF PROCEDURE:  05/04/2007  DATE OF DISCHARGE:                               OPERATIVE REPORT   SURGEON OF RECORD:  Cheral Marker.  Berline Lopes, D.P.M.   TYPE OF ANESTHESIA:  Monitored anesthesia care.   PREOP DIAGNOSIS:  Hallux valgus deformity right foot with positive uric  acid secondary to gouty arthritis.   POSTOPERATIVE DIAGNOSIS:  Hallux valgus deformity right foot with  positive uric acid secondary to gouty arthritis.   PROCEDURE:  A modified McBride bunionectomy, right first metatarsal  phalangeal joint with excision of tophaceous deposit, right first  metatarsal phalangeal joint.   INDICATIONS FOR SURGERY:  A longstanding history of pain unrelieved by  conservative care.  Inability to wearing enclosed shoes.   The patient was brought into the operating room and placed on the  operating table in the supine position.  The patient's lower right foot  and leg were then prepped and draped in the usual aseptic manner.  Then  with an ankle tourniquet placed and well-padded to prevent contusion,  and tourniquet elevated to 250 mmHg.  After exsanguination of the right  foot the following surgical procedures were then performed under  monitored anesthesia care with a local infiltrate of 2% Xylocaine and  0.5% Marcaine.   MODIFIED MCBRIDE BUNIONECTOMY WITH EXCISION OF TOPHACEOUS DEPOSIT RIGHT  FIRST MTP:  Attention WAS directed to the right first MTP where a linear  incision was made.  The incision was widened and deepened via sharp and  blunt dissection being sure to identify and retract all vital  structures.  A tophaceous deposit was noted, that encompassed the  capsule of the first MTP dorsally and medially.  After incising the  tophaceous deposit, a liquid material consistent with uric acid was  expressed from the wound.  The tophaceous deposit was totally excised  and sent to pathology for evaluation.   It was noted that the patient had multiple bone cysts on the phalanx and  on the ulnar dorsal aspect; and these bone cysts were curetted.  The  medial eminence on the medial aspect of the first metatarsal was then  resected utilizing a Zimmer oscillating saw.  Dissection was then  carried down deep in the first web space where a lateral fibular  sesamoid release was performed.  It was noted that the sesamoid  apparatus had multiple gout in the first MTP.  The head of the first  metatarsal head, almost 75%, had a white material consistent with uric  acid.  Mostly of the areas away from the articular cartilage were  curetted.  The wound was lavaged with copious amounts of sterile saline;  and the capsule and subcutaneous tissues were approximated with sutures  of 4-0 Dexon.  The skin was approximated utilizing, running subcuticular  suture of 4-0 Dexon.   All surgical sites were then infiltrated  with approximately 0.125 mL of  dexamethasone phosphate; and mild compressive bandages consisting of  Betadine-soaked Adaptic, sterile 4x4s, and sterile Kling were then  applied.  The patient tolerated the procedure well; and left the  operating room in apparent good condition.  Vital signs stable to  recovery.      Zella Richer Berline Lopes, D.P.M.  Electronically Signed     DBT/MEDQ  D:  05/04/2007  T:  05/04/2007  Job:  [id number]

## 2011-05-10 NOTE — Op Note (Signed)
   NAME:  [name], [name]                         ACCOUNT NO.:  [id number]   MEDICAL RECORD NO.:  [id number]                   PATIENT TYPE:  AMB   LOCATION:  DAY                                  FACILITY:  [facility]   PHYSICIAN:  Marissa Nestle, M.D.            DATE OF BIRTH:  Oct 18, 1946   DATE OF PROCEDURE:  03/25/2003  DATE OF DISCHARGE:                                 OPERATIVE REPORT   PREOPERATIVE DIAGNOSIS:  Right hydrocele.   POSTOPERATIVE DIAGNOSIS:  Right hydrocele.   PROCEDURE:  Right hydrocelectomy.   ANESTHESIA:  Spinal.   DESCRIPTION OF PROCEDURE:  Patient under spinal anesthesia in the supine  position, area prepped and draped.  The right hemiscrotum was held by the  assistant and I used a 20-gauge needle to aspirate the hydrocele, and it was  clear.  Transverse incision across the midscrotum about an inch and a half  long was made, carried down through the subcutaneous and superficial layers  with the help of the cautery, the tunica vaginalis exposed.  It was opened  and clear amber-colored fluid was drained.  The testicle was delivered  through this wound.  The hydrocele sac is everted.  The testicle looks  grossly normal.  Appendix testis was removed.  The everted hydrocele sac was  imbricated along the margin of the testicle in a running 3-0 Vicryl stitch.  There is no bleeding going on.  The testicle was replaced into the scrotum.  The fascial layers were closed with a continuous stitch of 3-0 Vicryl.  Skin  is closed with 4-0 chromic horizontal mattress stitches.  The wound is  treated with Neosporin ointment and a fluff dressing is applied.  The  patient left the operating room in satisfactory condition.                                               Marissa Nestle, M.D.    MIJ/MEDQ  D:  03/25/2003  T:  03/26/2003  Job:  [id number]

## 2011-05-10 NOTE — Op Note (Signed)
[name], [name] NO.:  [id number]   MEDICAL RECORD NO.:  [id number]          PATIENT TYPE:  INP   LOCATION:  3010                         FACILITY:  [facility]   PHYSICIAN:  [name], M.D.DATE OF BIRTH:  01-15-1946   DATE OF PROCEDURE:  03/10/2006  DATE OF DISCHARGE:                                 OPERATIVE REPORT   BRIEF HISTORY:  The patient is a 65 year old white male, who I performed an  L3-4 and 4-5 fusion on several years ago.  The patient did well initially  but over the last year or so he has developed increasing back, hip, and leg  pain.  He failed medical management and was worked up with a lumbar MRI as  well as a lumbar myelo-CT, which demonstrated the patient developed  significant facet arthropathy and stenosis at L2-3.  I discussed the various  treatment options with the patient including surgery, the patient has  weighed the risks, benefits, and alternatives to surgery, and decided to  proceed with the L2-3 decompression and fusion.   PREOPERATIVE DIAGNOSES:  1.  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  2.  Stenosis.  3.  Lumbago.  4.  Lumbar radiculopathy.   POSTOPERATIVE DIAGNOSES:  1.  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  2.  Stenosis.  3.  Lumbago.  4.  Lumbar radiculopathy.   PROCEDURE:  L2 decompressive laminectomy; L2-3 posterior lumbar interbody  fusion; insertion of bilateral L2-3 interbody prosthesis (Capstone PEEK  Cages); posterior segmental instrumentation L2 to L5 with Legacy titanium  pedicle screws and rods; L2-3 posterolateral arthrodesis with local  morcellized autograft bone and VITOSS bone graft extender.   SURGEON:  [name], MD.   ASSISTANT:  Earleen Newport, MD.   ANESTHESIA:  General endotracheal.   ESTIMATED BLOOD LOSS:  400 ml.   SPECIMENS:  None.   DRAINS:  None.   COMPLICATIONS:  None.   DESCRIPTION OF PROCEDURE:  The patient was brought to the operating room by  the Anesthesia team, a general endotracheal anesthesia  was induced.  The  patient was then carefully turned to the prone position on a Wilson frame,  his lumbosacral region was then prepared with Betadine scrub and Betadine  solution, and sterile drapes were applied.  I then injected the area to be  incised with Marcaine with epinephrine solution.  I used a scalpel to make a  linear midline incision through the patient's previous surgical scar.  I  extended the incision in a cephalad direction.  I then used electrocautery  to perform a subperiosteal dissection exposing the spinous process and  laminae of L1, 2, 3, etc.  I used electrocautery to expose the previous  hardware.  We then obtained an intraoperative radiograph to confirm our  location and then inserted the Versatrac retractor for exposure.  We began  by excising the L2-3 and L1-2 interspinous ligament with the scalpel.  We  then used a Leksell rongeur to remove the L2 spinous process and part of the  L2 lamina.  We shaved this bone and later cleared it off soft tissue and  morcellized it and used  in the fusion process.  We then used a high-speed  drill to perform bilateral L2 laminotomies.  I completed the L2 near  complete laminectomy using the Kerrison punch and then I removed the L2-3  ligamentum flavum.  There was severe facet arthropathy.  I then used the  Kerrison punch to perform a foraminotomy about the bilateral L2 and L3 nerve  roots, completing the decompression at this level.   We now turned our attention to the fusion.  I incised the L2-3  intervertebral disk with the #15-blade scalpel and we performed a near total  diskectomy using the pituitary forceps and the Epstein endoscopic curettes.  Then, in preparation for the posterior lumbar interbody fusion, we used the  curette to remove the soft tissue from the vertebral planes at L2 and L3.  We then obtained 12 x 26 mm Capstone PEEK Cages, which we pre-filled with  local autograft bone and VITOSS.  We then inserted the  interbody prosthesis  into the L2-3 interspace, of course after retracting the neural structures  out of harm's way.  We filled in between the lateral to the interbody  prosthesis with local autograft bone and VITOSS, completing the posterior  lumbar interbody fusion.   We now turned our attention to the instrumentation.  We used the screwdriver  to remove the caps from the previous pedicle screws from L3 down to L5 and  to remove the cross connectors, and we then removed the rods.  I then  inspected the fusion, and it was solid at L3-4 and L4-5.  I then removed the  right L5 and L4 pedicle screws.  The pedicle screws at L4 and L5 on the left  side were more difficult to remove and I therefore decided to leave them in  and simply put a longer rod on that side.  Under fluoroscopic guidance, I  then cannulated the bilateral L2 pedicles with a bone probe and then tapped  the pedicles with a 5.5-mm tap, probed the inside of the tapped pedicles to  rule out cortical breaches, and then inserted a 6.5 x 55 mm pedicle screw  bilaterally at L2 under fluoroscopic guidance.  I then palpated along the  medial aspect of the pedicle bilaterally at L2 and noted there were no  cortical breaches and the L2 nerve root was uninjured.  I then placed a rod  on the left from L2 down to L5, we bent it to the appropriate contour,  screwed it in place with the caps, and I then placed a shorter rod on the  right side from the L2 to L3 pedicle screw.  We fashioned this in place with  the caps.  We tightened the caps after we placed the construct in some  compression.  This completed the instrumentation.   We now turned our attention to the posterolateral arthrodesis.  We used a  high-speed drill to decorticate the remainder of the L2-3 facet joint pars  region and then laid a combination of local morcellized autograft bone and  VITOSS over these decorticated posterolateral structures, completing the posterolateral  arthrodesis.   We then irrigated the wound out with Bacitracin solution, removed the  solution, inspected the thecal sac and the L2 and L3 nerve roots  bilaterally, and noted that they were well decompressed.  We then obtained  hemostasis using bipolar electrocautery.  We then removed the Versatrac  retractor.  We reapproximated the patient's direct lumbar fascia with  interrupted #1 Vicryl suture, the  subcutaneous tissue with #2-0 Vicryl  suture, and the skin with Steri-Strips and Benzoin.  The wound was then  coated with Bacitracin ointment and a sterile dressing applied, the drapes  were removed, and the patient was subsequently returned to the supine  position where he was extubated by the Anesthesia team and transported to  the post anesthesia care unit in stable condition.  All sponge, instrument,  and needle counts were correct at the end of this case.      [name], M.D.  Electronically Signed     JDJ/MEDQ  D:  03/10/2006  T:  03/11/2006  Job:  [id number]

## 2011-05-10 NOTE — H&P (Signed)
   NAME:  [name], [name]                         ACCOUNT NO.:  [id number]   MEDICAL RECORD NO.:  [id number]                   PATIENT TYPE:  AMB   LOCATION:  DAY                                  FACILITY:  [facility]   PHYSICIAN:  Marissa Nestle, M.D.            DATE OF BIRTH:  [birth year]   DATE OF ADMISSION:  DATE OF DISCHARGE:                                HISTORY & PHYSICAL   CHIEF COMPLAINT:  Right hydrocele.   HISTORY OF PRESENT ILLNESS:  The patient is a 65 year old gentleman well  known to me who had left hydrocelectomy repair in 1996, and now he has  developed a large hydrocele on the right side which is quite symptomatic.  He wanted me to go ahead and fix that side also.  So he is coming to the  outpatient to have it done.  He had a testicular ultrasound done which shows  only large right hydrocele and normal testicles bilaterally.   PAST MEDICAL HISTORY:  1. Hypertension for which he is taking medication.  No history of diabetes.  2. Inguinal hernia repair 18 years ago.  3. History of cardiac arrhythmias.   SOCIAL HISTORY:  He quit smoking several years ago, he does not drink  alcohol.   MEDICATIONS:  He takes medication for his blood pressure.   REVIEW OF SYMPTOMS:  Denies any orthopnea, PND, nausea, vomiting, fever,  chills.   PHYSICAL EXAMINATION:  VITAL SIGNS:  Blood pressure 130/80, temperature is  normal.  NEUROLOGIC:  Negative.  HEART:  Negative.  HEENT:  Negative.  CHEST:  Clear to auscultation and percussion.  Normal sinus rhythm.  GENITOURINARY:  External genitalia is uncircumcised, meatus is adequate.  The left testicle is normal.  The right hemiscrotum is large and  transilluminates freely.  RECTAL:  Prostate 1.5+, smooth, and firm.   IMPRESSION:  Right hydrocele.   PLAN:  Right hydrocelectomy under anesthesia as an outpatient.  I have  discussed the procedure, complications, which he is aware of.  He wants me  to go ahead and proceed with  it.                                                Marissa Nestle, M.D.    MIJ/MEDQ  D:  03/24/2003  T:  03/24/2003  Job:  [id number]

## 2011-05-10 NOTE — Letter (Signed)
January 08, 2007    [name], M.D.  [address] [place], [place] [address]   RE:  TYON, CERASOLI  MRN:  [id number]  /  DOB:  Feb 16, 1946   Dear [place]:   [name] returns to the office for continued assessment and treatment  of hypertension and hypertensive heart disease.  He has not required  hospitalization for cardiac problems since 1995.  He is essentially  asymptomatic.  He is mostly limited by chronic low back pain, reports  that he underwent surgery for this in the past year.  He is pursuing  aerobics and swimming in order to help his back and remain in generally  good shape.  His pedal edema has virtually disappeared.   CURRENT MEDICATIONS:  1. Tenormin 50 mg daily.  2. Verapamil 80 mg t.i.d.  3. Captopril 50 mg t.i.d.  4. Diclofenac 75 mg b.i.d.   EXAMINATION:  A pleasant, overweight gentleman, in no acute distress.  The weight is 328, four pounds less than in March 2007.  Blood pressure  120/75.  Heart rate 58 and regular.  Respirations 16.  NECK:  No jugular venous distention.  Normal carotid upstrokes without  bruits.  LUNGS:  Clear.  CARDIAC:  Normal 1st and 2nd heart sounds.  Minimal basilar systolic  murmur.  ABDOMEN:  Soft and non-tender.  No organomegaly.  EXTREMITIES:  One half plus edema on the right.  Normal distal pulses.   Laboratory from August was generally good, however, there was borderline  anemia with a normal MCV.   IMPRESSION:  Mr. Boy is doing quite well in general.  We will obtain  a repeat CBC and iron studies to further investigate his anemia.  These  results will be forwarded to you.  Current medications appear optimal.  He has never undergone colonoscopy.  You may wish to arrange this study  for him.  Influenza vaccine is up to date.  I will see this nice  gentleman again in 1 year.    Sincerely,      Cristopher Estimable. Lattie Haw, MD, [facility]  Electronically Signed    RMR/MedQ  DD: 01/08/2007  DT: 01/08/2007  Job #: [id number]

## 2011-05-24 ENCOUNTER — Other Ambulatory Visit: Payer: Self-pay | Admitting: Cardiology

## 2011-06-21 ENCOUNTER — Other Ambulatory Visit: Payer: Self-pay | Admitting: Cardiology

## 2011-07-21 ENCOUNTER — Other Ambulatory Visit: Payer: Self-pay | Admitting: Cardiology

## 2011-07-23 ENCOUNTER — Other Ambulatory Visit: Payer: Self-pay | Admitting: Cardiology

## 2011-08-07 ENCOUNTER — Other Ambulatory Visit: Payer: Self-pay | Admitting: Cardiology

## 2011-08-08 NOTE — Telephone Encounter (Signed)
Please call for appt  

## 2011-08-20 ENCOUNTER — Other Ambulatory Visit: Payer: Self-pay | Admitting: Cardiology

## 2011-08-22 ENCOUNTER — Encounter: Payer: Self-pay | Admitting: Cardiology

## 2011-08-23 ENCOUNTER — Ambulatory Visit (INDEPENDENT_AMBULATORY_CARE_PROVIDER_SITE_OTHER): Payer: Medicare Other | Admitting: Cardiology

## 2011-08-23 ENCOUNTER — Encounter: Payer: Self-pay | Admitting: Cardiology

## 2011-08-23 DIAGNOSIS — Z87891 Personal history of nicotine dependence: Secondary | ICD-10-CM

## 2011-08-23 DIAGNOSIS — N529 Male erectile dysfunction, unspecified: Secondary | ICD-10-CM

## 2011-08-23 DIAGNOSIS — I10 Essential (primary) hypertension: Secondary | ICD-10-CM

## 2011-08-23 DIAGNOSIS — I472 Ventricular tachycardia: Secondary | ICD-10-CM

## 2011-08-23 DIAGNOSIS — F17201 Nicotine dependence, unspecified, in remission: Secondary | ICD-10-CM

## 2011-08-23 DIAGNOSIS — E669 Obesity, unspecified: Secondary | ICD-10-CM

## 2011-08-23 DIAGNOSIS — I4729 Other ventricular tachycardia: Secondary | ICD-10-CM

## 2011-08-23 DIAGNOSIS — N183 Chronic kidney disease, stage 3 unspecified: Secondary | ICD-10-CM | POA: Insufficient documentation

## 2011-08-23 DIAGNOSIS — M1990 Unspecified osteoarthritis, unspecified site: Secondary | ICD-10-CM

## 2011-08-23 DIAGNOSIS — I5030 Unspecified diastolic (congestive) heart failure: Secondary | ICD-10-CM | POA: Insufficient documentation

## 2011-08-23 DIAGNOSIS — D649 Anemia, unspecified: Secondary | ICD-10-CM

## 2011-08-23 DIAGNOSIS — Z7901 Long term (current) use of anticoagulants: Secondary | ICD-10-CM

## 2011-08-23 MED ORDER — TADALAFIL 10 MG PO TABS: 10.0000 mg | ORAL_TABLET | Freq: Every day | ORAL | Status: AC | PRN

## 2011-08-23 MED ORDER — FUROSEMIDE 40 MG PO TABS: 40.0000 mg | ORAL_TABLET | Freq: Two times a day (BID) | ORAL | Status: DC

## 2011-08-23 NOTE — Assessment & Plan Note (Signed)
Patient requested free samples of a PDE-5 inhibitor.  Unfortunately none were available.  I have offered a prescription if he so desires.

## 2011-08-23 NOTE — Assessment & Plan Note (Addendum)
It has been years since patient has had any evidence for congestive heart failure or diastolic left ventricular dysfunction.  It may be that with better control of hypertension, LVH has regressed and diastolic function improved.  We will attempt to decrease his dose of furosemide and monitor electrolytes and renal function.

## 2011-08-23 NOTE — Progress Notes (Signed)
HPI : [name] returns the office as scheduled for continued assessment and treatment of hypertension and hypertensive heart disease with a history of congestive heart failure related to diastolic dysfunction.  Over the past year, he has done quite well with no cardiac problems, no hospitalizations, no emergency department evaluations and no new medical problems other than a trigger finger that responded to steroid injections and a ganglionic cyst that was drained in his primary care physician's office.  He has had continuing back pain with a recommendation for surgery from Dr. [name], but good results with therapy from Dr. Niel [name].  Current Outpatient Prescriptions on File Prior to Visit  Medication Sig Dispense Refill  . ALPRAZolam (XANAX) 1 MG tablet Take 0.5 mg by mouth every 8 (eight) hours.        Marland Kitchen aspirin 81 MG tablet Take 81 mg by mouth daily.        Marland Kitchen atenolol (TENORMIN) 50 MG tablet TAKE ONE (1) TABLET EACH DAY  30 tablet  6  . captopril (CAPOTEN) 50 MG tablet TAKE ONE (1) TABLET                     THREE (3) TIMES EACH DAY  30 tablet  0  . furosemide (LASIX) 40 MG tablet Take 40 mg by mouth 3 (three) times daily.        Marland Kitchen ibuprofen (ADVIL,MOTRIN) 800 MG tablet Take 800 mg by mouth as needed.        . vardenafil (LEVITRA) 20 MG tablet Take 20 mg by mouth daily as needed.        . verapamil (CALAN) 80 MG tablet TAKE ONE (1) TABLET                     THREE (3) TIMES EACH DAY  90 tablet  4     No Known Allergies    Past medical history, social history, and family history reviewed and updated.  ROS: Denies chest pain, dyspnea, orthopnea, PND, lightheadedness or syncope.  Notes mild pedal edema.  PHYSICAL EXAM: BP 142/67  Pulse 60  Resp 18  Ht 6' 3"  (1.905 m)  Wt 293 lb (132.904 kg)  BMI 36.62 kg/m2  SpO2 96%  General-Well developed; no acute distress; boisterous as ever; slight difficulty finding or pronouncing some words Body habitus-obese Neck-No JVD; no carotid  bruits Lungs-clear lung fields; resonant to percussion Cardiovascular-normal PMI; normal S1 and S2 Abdomen-normal bowel sounds; soft and non-tender without masses or organomegaly Musculoskeletal-No deformities, no cyanosis or clubbing Neurologic-Normal cranial nerves; symmetric strength and tone Skin-Warm, no significant lesions Extremities-distal pulses intact; 1/2+ edema  EKG: Normal sinus rhythm with first degree AV block; voltage criteria for LVH; otherwise normal.  No change compared with the previous tracing of 03/31/2002  ASSESSMENT AND PLAN:

## 2011-08-23 NOTE — Assessment & Plan Note (Signed)
Blood pressure control is good, and current medications will be continued.

## 2011-08-23 NOTE — Assessment & Plan Note (Addendum)
Patient has lost 10 pounds since his previous visit and is encouraged to lose more.

## 2011-08-23 NOTE — Patient Instructions (Addendum)
Your physician recommends that you continue on your current medications as directed. Please refer to the Current Medication list given to you today.  Your physician has recommended you make the following change in your medication: start taking Cialis as needed, decrease Lasix to 40 mg twice daily  Your physician recommends that you return for lab work in: 1, 4 and 8 months   Your physician recommends that you schedule a follow-up appointment in: 1 year

## 2011-08-23 NOTE — Assessment & Plan Note (Signed)
Previous issues with discomfort in the hips and knees are secondary at present.  He continues to have chronic back problems with 2 previous lumbar surgeries.  I've encouraged him to avoid a third if at all possible.  Dr. Niel Hummer will provide ongoing treatment.

## 2011-08-23 NOTE — Assessment & Plan Note (Signed)
Ventricular tachycardia has only been seen during exercise test in the past, and patient has no symptoms to suggest a clinically significant arrhythmia.

## 2011-08-23 NOTE — Assessment & Plan Note (Addendum)
Stool for Hemoccult testing, a repeat CBC and an anemia profile will be obtained.  Abnormalities, if any, will be referred to Dr. [name].

## 2011-08-27 ENCOUNTER — Other Ambulatory Visit: Payer: Self-pay | Admitting: Cardiology

## 2011-10-08 ENCOUNTER — Ambulatory Visit (HOSPITAL_COMMUNITY)
Admission: RE | Admit: 2011-10-08 | Discharge: 2011-10-08 | Disposition: A | Discharge status: Home / Self Care | Payer: Medicare Other | Source: Ambulatory Visit | Attending: Family Medicine | Admitting: Family Medicine

## 2011-10-08 ENCOUNTER — Other Ambulatory Visit (HOSPITAL_COMMUNITY): Payer: Self-pay | Admitting: Family Medicine

## 2011-10-08 DIAGNOSIS — M779 Enthesopathy, unspecified: Secondary | ICD-10-CM

## 2011-10-08 DIAGNOSIS — IMO0002 Reserved for concepts with insufficient information to code with codable children: Secondary | ICD-10-CM | POA: Insufficient documentation

## 2011-10-08 DIAGNOSIS — E119 Type 2 diabetes mellitus without complications: Secondary | ICD-10-CM

## 2011-10-08 DIAGNOSIS — M1710 Unilateral primary osteoarthritis, unspecified knee: Secondary | ICD-10-CM | POA: Insufficient documentation

## 2011-10-08 DIAGNOSIS — M25469 Effusion, unspecified knee: Secondary | ICD-10-CM | POA: Insufficient documentation

## 2011-10-08 DIAGNOSIS — M25569 Pain in unspecified knee: Secondary | ICD-10-CM | POA: Insufficient documentation

## 2011-11-15 ENCOUNTER — Other Ambulatory Visit: Payer: Self-pay | Admitting: Cardiology

## 2011-11-24 ENCOUNTER — Encounter: Payer: Self-pay | Admitting: Cardiology

## 2011-11-27 ENCOUNTER — Encounter: Payer: Self-pay | Admitting: Urgent Care

## 2011-12-06 ENCOUNTER — Ambulatory Visit: Payer: Medicare Other | Admitting: Urgent Care

## 2011-12-06 ENCOUNTER — Telehealth: Payer: Self-pay | Admitting: Urgent Care

## 2011-12-06 NOTE — Telephone Encounter (Signed)
Pt was a no show

## 2011-12-06 NOTE — Telephone Encounter (Signed)
Please let [name], MD know

## 2011-12-24 DIAGNOSIS — J111 Influenza due to unidentified influenza virus with other respiratory manifestations: Secondary | ICD-10-CM

## 2011-12-24 HISTORY — DX: Influenza due to unidentified influenza virus with other respiratory manifestations: J11.1

## 2012-02-12 ENCOUNTER — Other Ambulatory Visit: Payer: Self-pay | Admitting: *Deleted

## 2012-02-12 DIAGNOSIS — I10 Essential (primary) hypertension: Secondary | ICD-10-CM

## 2012-02-15 ENCOUNTER — Other Ambulatory Visit: Payer: Self-pay | Admitting: Cardiology

## 2012-02-18 ENCOUNTER — Other Ambulatory Visit (HOSPITAL_COMMUNITY): Payer: Self-pay | Admitting: General Surgery

## 2012-02-18 ENCOUNTER — Ambulatory Visit: Payer: Medicare Other | Admitting: Cardiology

## 2012-02-18 DIAGNOSIS — T148XXA Other injury of unspecified body region, initial encounter: Secondary | ICD-10-CM

## 2012-02-19 ENCOUNTER — Ambulatory Visit (HOSPITAL_COMMUNITY)
Admission: RE | Admit: 2012-02-19 | Discharge: 2012-02-19 | Disposition: A | Discharge status: Home / Self Care | Payer: Medicare Other | Source: Ambulatory Visit | Attending: General Surgery | Admitting: General Surgery

## 2012-02-19 DIAGNOSIS — I739 Peripheral vascular disease, unspecified: Secondary | ICD-10-CM | POA: Insufficient documentation

## 2012-02-19 DIAGNOSIS — M79609 Pain in unspecified limb: Secondary | ICD-10-CM | POA: Insufficient documentation

## 2012-02-19 DIAGNOSIS — T148XXA Other injury of unspecified body region, initial encounter: Secondary | ICD-10-CM

## 2012-02-19 DIAGNOSIS — L989 Disorder of the skin and subcutaneous tissue, unspecified: Secondary | ICD-10-CM | POA: Insufficient documentation

## 2012-04-08 ENCOUNTER — Other Ambulatory Visit: Payer: Self-pay | Admitting: Cardiology

## 2012-04-16 ENCOUNTER — Other Ambulatory Visit: Payer: Self-pay | Admitting: *Deleted

## 2012-04-16 DIAGNOSIS — I10 Essential (primary) hypertension: Secondary | ICD-10-CM

## 2012-04-28 ENCOUNTER — Encounter: Payer: Self-pay | Admitting: *Deleted

## 2012-04-30 ENCOUNTER — Telehealth: Payer: Self-pay | Admitting: *Deleted

## 2012-04-30 ENCOUNTER — Encounter: Payer: Self-pay | Admitting: *Deleted

## 2012-04-30 NOTE — Telephone Encounter (Signed)
Attempted to contact patient by phone, as 2 letters have been sent reminding patient of past due lab work.  Will send final letter today.

## 2012-06-15 ENCOUNTER — Ambulatory Visit (HOSPITAL_COMMUNITY)
Admission: RE | Admit: 2012-06-15 | Discharge: 2012-06-15 | Disposition: A | Discharge status: Home / Self Care | Payer: Medicare Other | Source: Ambulatory Visit | Attending: Physical Medicine and Rehabilitation | Admitting: Physical Medicine and Rehabilitation

## 2012-06-15 ENCOUNTER — Other Ambulatory Visit (HOSPITAL_COMMUNITY): Payer: Self-pay | Admitting: Physical Medicine and Rehabilitation

## 2012-06-15 DIAGNOSIS — M79604 Pain in right leg: Secondary | ICD-10-CM

## 2012-06-15 DIAGNOSIS — M545 Low back pain, unspecified: Secondary | ICD-10-CM

## 2012-06-15 DIAGNOSIS — W19XXXA Unspecified fall, initial encounter: Secondary | ICD-10-CM

## 2012-06-15 DIAGNOSIS — Z9889 Other specified postprocedural states: Secondary | ICD-10-CM | POA: Insufficient documentation

## 2012-06-15 DIAGNOSIS — M79609 Pain in unspecified limb: Secondary | ICD-10-CM | POA: Insufficient documentation

## 2012-06-15 DIAGNOSIS — M79605 Pain in left leg: Secondary | ICD-10-CM

## 2012-07-28 ENCOUNTER — Other Ambulatory Visit (HOSPITAL_COMMUNITY): Payer: Self-pay | Admitting: Physical Medicine and Rehabilitation

## 2012-07-28 ENCOUNTER — Ambulatory Visit (HOSPITAL_COMMUNITY)
Admission: RE | Admit: 2012-07-28 | Discharge: 2012-07-28 | Disposition: A | Discharge status: Home / Self Care | Payer: Medicare Other | Source: Ambulatory Visit | Attending: Physical Medicine and Rehabilitation | Admitting: Physical Medicine and Rehabilitation

## 2012-07-28 DIAGNOSIS — M25569 Pain in unspecified knee: Secondary | ICD-10-CM

## 2012-07-28 DIAGNOSIS — M2550 Pain in unspecified joint: Secondary | ICD-10-CM

## 2012-07-28 DIAGNOSIS — M1710 Unilateral primary osteoarthritis, unspecified knee: Secondary | ICD-10-CM | POA: Insufficient documentation

## 2012-07-28 DIAGNOSIS — IMO0002 Reserved for concepts with insufficient information to code with codable children: Secondary | ICD-10-CM | POA: Insufficient documentation

## 2012-08-11 ENCOUNTER — Other Ambulatory Visit (HOSPITAL_COMMUNITY): Payer: Self-pay | Admitting: Family Medicine

## 2012-08-11 DIAGNOSIS — G589 Mononeuropathy, unspecified: Secondary | ICD-10-CM

## 2012-08-12 ENCOUNTER — Inpatient Hospital Stay (HOSPITAL_COMMUNITY): Admission: RE | Admit: 2012-08-12 | Payer: Medicare Other | Source: Ambulatory Visit

## 2012-08-12 ENCOUNTER — Ambulatory Visit: Payer: Medicare Other | Admitting: Orthopedic Surgery

## 2012-08-19 ENCOUNTER — Ambulatory Visit (INDEPENDENT_AMBULATORY_CARE_PROVIDER_SITE_OTHER): Payer: Medicare Other | Admitting: Orthopedic Surgery

## 2012-08-19 ENCOUNTER — Encounter: Payer: Self-pay | Admitting: Orthopedic Surgery

## 2012-08-19 VITALS — BP 152/80 | Ht 75.0 in | Wt 296.0 lb

## 2012-08-19 DIAGNOSIS — IMO0002 Reserved for concepts with insufficient information to code with codable children: Secondary | ICD-10-CM

## 2012-08-19 DIAGNOSIS — M170 Bilateral primary osteoarthritis of knee: Secondary | ICD-10-CM

## 2012-08-19 DIAGNOSIS — M1710 Unilateral primary osteoarthritis, unspecified knee: Secondary | ICD-10-CM

## 2012-08-19 NOTE — Progress Notes (Signed)
Patient ID: [name], male   DOB: 11-Jun-1946, 66 y.o.   MRN: [id number] Chief Complaint  Patient presents with  . Knee Pain    bilateral knee pain x 1 year, gradual onset, no known injury    Self referral  This patient has multiple medical problems history of lumbar disc surgery Dr. [name] currently followed by Dr. Ace [name] for chronic pain management, presents now for evaluation of bilateral knee pain right worse than left. He was doing well until about a year ago when his knee pain started to get worse. He complains of primarily medial and anterior medial knee pain with catching grinding difficulty getting out of a chair difficulty 1 to the bathroom getting in and out of his car.  His review of system is remarkable for pain in his lower back. He denies any heart related issues or kidney related issues despite his medical history stating that he has coronary artery disease and grade 3 end-stage renal disease with a creatinine of 1.5 in 2010.  Past Medical History  Diagnosis Date  . Diastolic heart failure     Nl EF; normal coronary arteries (1995); stress nuclear in 2/10 EF of 41%, no scarring or ischemia, EF of 50% by echo in 2010; pedal edema  . Hypertension     Exercise induced  . NSVT (nonsustained ventricular tachycardia)     Exercise induced  . 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 Obesity   . Degenerative joint disease   . Benign prostatic hypertrophy     Nocturia  . Erectile dysfunction   . Chronic kidney disease, stage 3, mod decreased GFR     Creatinine of 1.5 in 03/2009    Past Surgical History  Procedure Date  . Back surgery     Family History  Problem Relation Age of Onset  . Hypertension Other   . Cancer Other   . Heart disease    . Diabetes     History   Social History Narrative   Married   His wife has passed away her.phys  Physical Exam  Vitals reviewed. Constitutional: He is oriented to person, place, and time and  well-developed, well-nourished, and in no distress.  HENT:  Head: Normocephalic.  Eyes: Pupils are equal, round, and reactive to light.  Neck: Normal range of motion. No JVD present.  Cardiovascular: Intact distal pulses.   Pulmonary/Chest: Effort normal. No respiratory distress.  Abdominal: He exhibits no distension.  Musculoskeletal:       BP 152/80  Ht 6' 3"  (1.905 m)  Wt 296 lb (134.265 kg)  BMI 37.00 kg/m2  Upper extremity evaluation reveals multiple deformities in his hands from what is believed to be chronic gout plus or minus arthritic changes. He is also missing his left thumb from an induction her accident. Otherwise all joints are reduced without subluxation. No contractures are noted in the shoulder or elbow but there are multiple contractures and deformity of the metacarpophalangeal joints and interphalangeal joints of both hands. Joint stability confirmed. Muscle tone normal.  The lower extremities specifically the knees show the following. Right knee: Flexion 100 extension 5 giving an arc of 95. Medial joint line tenderness. Stability normal. Muscle tone strength normal.  Left knee: Flexion 105 extension 5 arc 100. Medial joint line tenderness patellofemoral crepitance more severe on the left than right muscle tone and strength normal knee joint stable.  Lymphadenopathy:    He has no cervical adenopathy.  Right: No inguinal adenopathy present.       Left: No inguinal adenopathy present.  Neurological: He is alert and oriented to person, place, and time.  Skin: Skin is warm and dry.  Psychiatric: Mood, memory, affect and judgment normal.    Imaging of the left knee shows severe patellofemoral disease moderate tibiofemoral disease  The right knee shows more severe medial compartment joint space narrowing with near bone-on-bone changes. There is also patellofemoral disease as well. It is quite severe.  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 [name] with continued chronic pain followed by Dr. [name] a who presents with underlying coronary artery disease and renal disease followed by the [facility] and [place] who has reached end-stage disease in his knee especially on the right. The patient is deemed high risk for surgery and will require cardiology coverage throughout his hospital stay if he decides to have knee replacement. He is interested in knee replacement. I'm also concerned that he would need a specialized pain management such as an epidural catheter after surgery and/or femoral nerve block which we cannot do and [place]. With the medical problems he has I would recommend that he be seen in [facility] for surgery at [facility]. I'm referring him degrees per orthopedics.

## 2012-08-19 NOTE — Patient Instructions (Addendum)
We will make referral to [facility] for total knee replacement   You have received a steroid shot. 15% of patients experience increased pain at the injection site with in the next 24 hours. This is best treated with ice and tylenol extra strength 2 tabs every 8 hours. If you are still having pain please call the office.     Preparing for Knee Replacement Recovery from knee replacement surgery can be made easier and more comfortable by taking steps to be prepared before surgery. This includes:  Arranging for others to help you.   Preparing your home.   Making sure your body is prepared by doing a pre-operative exam and being as healthy as you can.   Doing exercises before your surgery as told by your caregiver.  Also, you can ease any concerns about your financial responsibilities by calling your insurance company after you decide to have surgery. In addition to your surgery and hospital stay, you will want to ask about your coverage for medical equipment, rehab facilities, and home care. ARRANGING FOR HELP   You will be getting stronger and more mobile every day. However, in the first couple weeks after surgery, it is unlikely you will be able to do all your daily activities as easily as before your surgery. You will tire easily and still have limited movement in your leg. Follow these guidelines to best arrange for the help you may need after your surgery:  Arrange for someone to drive you home from the hospital. Your surgeon will be able to tell you how many days you can expect to be in the hospital.   Cancel all work, care-giving, and volunteer responsibilities for at least 4 to 6 weeks after your surgery.   If you live alone, arrange for someone to care for your home and pets for the first 4 to 6 weeks.   Select someone with whom you feel comfortable to be with you day and night for the first week. This person will help you with your exercises and personal care, like bathing  and using the toilet.   Arrange for drivers to bring you to and from your doctor and therapy appointments, as well as to the grocery store and other places you need to go, for at least 4 to 6 weeks.   Select 2 or 3 rehab facilities where you would be comfortable recovering just in case you are not able to go directly home to recover.  PREPARING YOUR HOME  Remove all clutter from your floors.   To see if you will be able to move in these spaces with a wheeled walker, hold your hands out about 6 inches from your sides. Then walk from your bed to the bathroom. Walk from your resting spot to your kitchen and bathroom. If you do not hit anything with your hands, you probably have enough room.   Remove throw rugs.   Move the items you use often to shelves and drawers that are at countertop height.  Move items in your bathroom, kitchen, and bedroom.   Prepare a few meals that you can freeze and reheat later.   Do not plan on recovering in bed.  It is better for your health to sit upright. You may wish to use a recliner with a small table nearby. Keep the items you use most frequently on that table. These may include the TV remote, a cordless phone, a book or laptop computer, water glass, and any other items of  your choice.   Consider adding grab bars in the shower and near the toilet.   While you are in the hospital, you will learn about equipment helpful for your recovery. Some of the equipment includes raised toilet seats, tub benches, and shower benches. Often, your hospital care team will help you decide what you need and can direct you about where to buy these items. You may not need all of these items, and they are not often returnable, so it is not recommended that you buy them before going to the hospital.  [place]  Complete a pre-operative exam. This will ensure that your body is healthy enough to safely complete this surgery. Be certain to bring a complete list of all your  medicines and supplements (herbs and vitamins). You may be advised to have additional tests to ensure your safety.   Complete elective dental care and routine cleanings before the surgery. Germs from anywhere in your body, including those in your mouth, can travel to your new joint and infect it.  It is important not to have any dental work performed for at least 3 months after your surgery. After surgery, be sure to tell your dentist about your joint replacement.   Maintain a healthy diet. Unless advised by your surgeon, do not drastically change your diet before your surgery.   Quit smoking. Get help from your caregiver if you need it.   The day before your surgery, follow your surgeon's directions for showering, eating and drinking. These directions are for your safety.  EXERCISES Your caregiver may have you do the following exercises before your surgery. Be sure to follow the exercise program your caregiver prescribes for you. Doing the exercises on both sides will help prepare your "good" side as well. While completing these exercises, remember:    Stretch as long as you can, up to 30 seconds, without pain developing.   You should only feel a gentle lengthening or release in the stretched tissue.  Ankle Pumps  While sitting with your knees straight, draw the top of your feet upwards by flexing your ankles.    Then, reverse the motion, pointing your toes downward.   Repeat 10 to 20 times. Complete this exercise 1 to 2 times per day.  Heel Slides  Lie on your back with both knees straight. (If this causes back discomfort, bend your opposite knee, placing your foot flat on the floor.)   Slowly slide your heel back toward your buttocks until you feel a gentle stretch in the front of your knee or thigh.   Slowly slide your heel back to the starting position.   Repeat 10 to 20 times. Complete this exercise 1 to 2 times per day.  Quadriceps Sets  Lie on your back with your sore leg  extended and your opposite knee bent.   Gradually tense the muscles in the front of your thigh. You should see either your kneecap slide up toward your hip or increased dimpling just above the knee. This motion will push the back of the knee down toward the floor.    Hold the muscle as tight as you can without increasing your pain for 10 seconds.   Relax the muscles slowly and completely in between each repetition.   Repeat 10 to 20 times. Complete this exercise 1 to 2 times per day.  Short Arc Kicks  Lie on your back. Place a 4 to 6 inch towel roll under your sore knee so that the  knee slightly bends.   Raise only your lower leg by tightening the muscles in the front of your thigh. Do not allow your thigh to rise.   Hold this position for 5 seconds.   Repeat 10 to 20 times. Complete this exercise 1 to 2 times per day.  Straight Leg Raises 1. Lie on your back with your sore leg extended and your opposite knee bent.   2. Tense the muscles in the front of your sore thigh. You should see either your kneecap slide up or increased dimpling just above the knee. Your thigh may even quiver.  3. Tighten these muscles even more and raise your leg 4 to 6 inches off the floor. Hold for 3 to 5 seconds.  4. Keeping these muscles tense, lower your leg.  5. Relax the muscles slowly and completely in between each repetition.  6. Repeat 10 to 20 times. Complete this exercise 1 to 2 times per day.  Arm Chair Push-ups 1. Find a firm, non-wheeled chair with solid armrests.  2. Sitting in the chair, extend your sore leg straight out in front of you.  3. Lift up your body weight, using your arms and opposite leg.  4. Slowly lower your body weight.   5. Repeat 10 to 20 times. Complete this exercise 1 to 2 times per day.  Document Released: 03/15/2011 Document Revised: 11/28/2011 Document Reviewed: 03/15/2011 [employer] Patient Information 2012 [place].Total Knee Replacement In total knee replacement,  the damaged knee is replaced with an artificial knee joint (prosthesis). The purpose of this surgery is to reduce pain and improve your range of motion. Regaining a near normal range of motion of your knee in the first 3 to 6 weeks after surgery is critical. Generally, these artificial joints last a minimum of 10 years. By that time, about 1 in 10 patients will need another surgery to repair the loose prosthesis. LET YOUR CAREGIVER KNOW ABOUT:    Allergies to food or medicine.   Medicines taken, including vitamins, herbs, eyedrops, over-the-counter medicines, and creams.   Use of steroids (by mouth or creams).   Previous problems with anesthetics or numbing medicines.   History of bleeding problems or blood clots.   Previous surgery.   Other health problems, including diabetes and kidney problems.   Possibility of pregnancy, if this applies.  RISKS AND COMPLICATIONS    Knee pain.   Loss of range of motion of the knee (stiffness) or instability.   Loosening of the prosthesis.   Infection.  BEFORE THE PROCEDURE    If you smoke, quit.   You may need a replacement or addition of blood (transfusion) during this procedure. You may want to donate your own blood for storage several weeks before the procedure. This way, your own blood can be stored and given to you if needed. Talk to your surgeon about this option.   Do not eat or drink anything for as long as directed by your caregiver before the procedure.   You may bathe and brush your teeth before the procedure. Do not swallow the water when brushing your teeth.   Scrub the area to be operated on for 5 minutes in the morning before the procedure. Use special soap if you are directed to do so by your caregiver.   Take your regular medicines with a small sip of water unless otherwise instructed. Your caregiver will let you know if there are medicines that need to be stopped and for how long.  You should be present 60 minutes before  your procedure or as directed by your caregiver.  PROCEDURE   Before surgery, an intravenous (IV) access for giving fluids will be started. You will be given medicines and/or gas to make you sleep (anesthetic). You may be given medicines in your back with a needle to make you numb from the waist down. Your surgeon will take out any damaged cartilage and bone. He or she will then put in new metal, plastic, or ceramic joint surfaces to restore alignment and function to your knee. AFTER THE PROCEDURE   You will be taken to the recovery area where a nurse will watch and check your progress. You may have a long, narrow tube (catheter) in your bladder after surgery. The catheter helps you empty your bladder (pass your urine). You may have drainage tubes coming out from under the dressing. These tubes attach to a device that removes blood or fluids that gather after surgery. Once you are awake, stable, and taking fluids well, you will be returned to your room. You will receive physical therapy as prescribed by your caregiver. If you do not have help at home, you may need to go to an extended care facility for a few weeks. If you are sent home with a continuous passive motion (CPM) machine, use it as instructed. Document Released: 03/17/2001 Document Revised: 11/28/2011 Document Reviewed: 10/11/2009 [facility] Patient Information 2012 [place].Total Knee Replacement Care After Refer to this sheet in the next few weeks. These discharge instructions provide you with general information on caring for yourself after you leave the hospital. Your caregiver may also give you specific instructions. Your treatment has been planned according to the most current medical practices available, but unavoidable complications sometimes occur. If you have any problems or questions after discharge, please call your caregiver. Regaining a near full range of motion of your knee within the first 3 to 6 weeks after surgery is  critical. [place] may resume a normal diet and activities as directed. Perform exercises as directed.   You will receive physical therapy as directed by your caregiver.   Take showers instead of baths until informed otherwise.   Change bandages (dressings) if necessary or as directed.   Only take over-the-counter or prescription medicines for pain, discomfort, or fever as directed by your caregiver.   Eat a well-balanced diet.   Avoid lifting or driving until you are instructed otherwise.   Make an appointment to see your caregiver for stitches (suture) or staple removal as directed.   If you have been sent home with a continuous passive motion machine (CPM machine), use as directed.  SEEK MEDICAL CARE IF:  You have swelling of your calf or leg.   You develop shortness of breath or chest pain.   You have redness, swelling, or increasing pain in the wound.   There is pus or any unusual drainage coming from the surgical site.   You notice a bad smell coming from the surgical site or dressing.   The surgical site breaks open after sutures or staples have been removed.   There is persistent bleeding from the suture or staple line.   You are getting worse or are not improving.   You have any other questions or concerns.  SEEK IMMEDIATE MEDICAL CARE IF:    You have a fever.   You develop a rash.   You have difficulty breathing.   You develop any reaction or  side effects to medicines given.   Your knee motion is decreasing rather than improving.  MAKE SURE YOU:    Understand these instructions.   Will watch your condition.   Will get help right away if you are not doing well or get worse.  Document Released: 06/28/2005 Document Revised: 11/28/2011 Document Reviewed: 10/11/2009 [facility] Patient Information 2012 [place].

## 2012-08-21 ENCOUNTER — Other Ambulatory Visit: Payer: Self-pay | Admitting: *Deleted

## 2012-08-21 DIAGNOSIS — M170 Bilateral primary osteoarthritis of knee: Secondary | ICD-10-CM

## 2012-10-02 ENCOUNTER — Telehealth: Payer: Self-pay | Admitting: Orthopedic Surgery

## 2012-10-02 NOTE — Telephone Encounter (Signed)
I spoke with patient, followed up with him, regarding referral appointment per last office note.  Referral had been faxed to [facility] at that time.  Patient states he has not been scheduled as of yet; states "deciding what I want to do about that."  I followed up with [facility]; spoke with Filbert Berthold, who states that they have not yet scheduled as they are awaiting further medical records from pain management.

## 2012-10-05 NOTE — Telephone Encounter (Signed)
Ok let him know

## 2012-10-06 NOTE — Telephone Encounter (Signed)
Patient aware.

## 2012-10-24 ENCOUNTER — Emergency Department (HOSPITAL_COMMUNITY)
Admission: EM | Admit: 2012-10-24 | Discharge: 2012-10-24 | Disposition: A | Discharge status: Home / Self Care | Payer: Medicare Other | Attending: Emergency Medicine | Admitting: Emergency Medicine

## 2012-10-24 ENCOUNTER — Encounter (HOSPITAL_COMMUNITY): Payer: Self-pay | Admitting: *Deleted

## 2012-10-24 DIAGNOSIS — I129 Hypertensive chronic kidney disease with stage 1 through stage 4 chronic kidney disease, or unspecified chronic kidney disease: Secondary | ICD-10-CM | POA: Insufficient documentation

## 2012-10-24 DIAGNOSIS — N529 Male erectile dysfunction, unspecified: Secondary | ICD-10-CM | POA: Insufficient documentation

## 2012-10-24 DIAGNOSIS — I5030 Unspecified diastolic (congestive) heart failure: Secondary | ICD-10-CM | POA: Insufficient documentation

## 2012-10-24 DIAGNOSIS — T7840XA Allergy, unspecified, initial encounter: Secondary | ICD-10-CM

## 2012-10-24 DIAGNOSIS — T50905A Adverse effect of unspecified drugs, medicaments and biological substances, initial encounter: Secondary | ICD-10-CM

## 2012-10-24 DIAGNOSIS — T4995XA Adverse effect of unspecified topical agent, initial encounter: Secondary | ICD-10-CM | POA: Insufficient documentation

## 2012-10-24 DIAGNOSIS — Z87891 Personal history of nicotine dependence: Secondary | ICD-10-CM | POA: Insufficient documentation

## 2012-10-24 DIAGNOSIS — T50995A Adverse effect of other drugs, medicaments and biological substances, initial encounter: Secondary | ICD-10-CM | POA: Insufficient documentation

## 2012-10-24 DIAGNOSIS — Z79899 Other long term (current) drug therapy: Secondary | ICD-10-CM | POA: Insufficient documentation

## 2012-10-24 DIAGNOSIS — Z7982 Long term (current) use of aspirin: Secondary | ICD-10-CM | POA: Insufficient documentation

## 2012-10-24 DIAGNOSIS — M1990 Unspecified osteoarthritis, unspecified site: Secondary | ICD-10-CM | POA: Insufficient documentation

## 2012-10-24 DIAGNOSIS — E7800 Pure hypercholesterolemia, unspecified: Secondary | ICD-10-CM | POA: Insufficient documentation

## 2012-10-24 DIAGNOSIS — E669 Obesity, unspecified: Secondary | ICD-10-CM | POA: Insufficient documentation

## 2012-10-24 DIAGNOSIS — Z8679 Personal history of other diseases of the circulatory system: Secondary | ICD-10-CM | POA: Insufficient documentation

## 2012-10-24 DIAGNOSIS — Z87448 Personal history of other diseases of urinary system: Secondary | ICD-10-CM | POA: Insufficient documentation

## 2012-10-24 HISTORY — DX: Pure hypercholesterolemia, unspecified: E78.00

## 2012-10-24 MED ORDER — PREDNISONE 20 MG PO TABS
ORAL_TABLET | ORAL | Status: AC
  Filled 2012-10-24: qty 3

## 2012-10-24 MED ORDER — EPINEPHRINE HCL 1 MG/ML IJ SOLN
0.3000 mg | Freq: Once | INTRAMUSCULAR | Status: AC
  Administered 2012-10-24: 0.3 mg via INTRAMUSCULAR
  Filled 2012-10-24: qty 1

## 2012-10-24 MED ORDER — PREDNISONE 50 MG PO TABS
60.0000 mg | ORAL_TABLET | Freq: Once | ORAL | Status: AC
  Administered 2012-10-24: 60 mg via ORAL

## 2012-10-24 MED ORDER — PREDNISONE 10 MG PO TABS: 20.0000 mg | ORAL_TABLET | Freq: Every day | ORAL | Status: DC

## 2012-10-24 MED ORDER — DIPHENHYDRAMINE HCL 25 MG PO CAPS: 25.0000 mg | ORAL_CAPSULE | Freq: Four times a day (QID) | ORAL | Status: DC | PRN

## 2012-10-24 MED ORDER — DIPHENHYDRAMINE HCL 25 MG PO CAPS
50.0000 mg | ORAL_CAPSULE | Freq: Once | ORAL | Status: AC
  Administered 2012-10-24: 50 mg via ORAL
  Filled 2012-10-24: qty 2

## 2012-10-24 NOTE — ED Notes (Signed)
Pt states that he was prescribed a new cholesterol medication, first dose was yesterday afternoon, second dose was this am, after second dose this am pt began to experience itching all over, burning sensation to body area. Denies any sob, rash, hives

## 2012-10-24 NOTE — ED Notes (Signed)
Patient reports flushing and burning sensation after taking Niacin today. No respiratory distress noted. Ambulatory to room with steady gait.

## 2012-10-24 NOTE — ED Provider Notes (Cosign Needed)
History  This chart was scribed for Mylinda Latina III, MD by Kevan Rosebush. This patient was seen in room APA10/APA10 and the patient's care was started at 11:44.   CSN: [id number]  Arrival date & time 10/24/12  1052   First MD Initiated Contact with Patient 10/24/12 1144      Chief Complaint  Patient presents with  . Allergic Reaction    (Consider location/radiation/quality/duration/timing/severity/associated sxs/prior treatment) The history is provided by the patient. No language interpreter was used.  [name] is a 66 y.o. male who presents to the Emergency Department complaining of an allergic reaction to his new cholesterol medication, Niacin (530m). Pt reports he was recently prescribed the medication (By PA BCollene Mares; he took one yesterday evening and one this morning with his regular medications then immediately got hot, like he was "burning up," and began itching all over. Pt has no other known drug allergies.  Past Medical History  Diagnosis Date  . Diastolic heart failure     Nl EF; normal coronary arteries (1995); stress nuclear in 2/10 EF of 41%, no scarring or ischemia, EF of 50% by echo in 2010; pedal edema  . Hypertension     Exercise induced  . NSVT (nonsustained ventricular tachycardia)     Exercise induced  . 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 Obesity   . Degenerative joint disease   . Benign prostatic hypertrophy     Nocturia  . Erectile dysfunction   . Chronic kidney disease, stage 3, mod decreased GFR     Creatinine of 1.5 in 03/2009  . High cholesterol     Past Surgical History  Procedure Date  . Back surgery     Family History  Problem Relation Age of Onset  . Hypertension Other   . Cancer Other   . Heart disease    . Diabetes      History  Substance Use Topics  . Smoking status: Former Smoker -- 1.0 packs/day for 30 years    Quit date: 12/23/1992  . Smokeless tobacco: Not on file  . Alcohol Use: No        Review of Systems  Constitutional: Negative for fever and chills.  HENT: Negative for congestion.   Eyes: Negative for visual disturbance.  Respiratory: Negative for cough, choking and shortness of breath.   Gastrointestinal: Negative for nausea and vomiting.  Genitourinary: Negative for hematuria.  Musculoskeletal: Negative for back pain.  Skin: Positive for rash.       Diffuse itching and redness  Neurological: Negative for weakness.  Psychiatric/Behavioral: The patient is not nervous/anxious.   All other systems reviewed and are negative.    Allergies  Review of patient's allergies indicates no known allergies.  Home Medications   Current Outpatient Rx  Name Route Sig Dispense Refill  . ALPRAZOLAM 1 MG PO TABS Oral Take 0.5 mg by mouth every 8 (eight) hours.      . ASPIRIN 81 MG PO TABS Oral Take 81 mg by mouth daily.      . ATENOLOL 50 MG PO TABS  TAKE ONE (1) TABLET EACH DAY 30 tablet 3  . CAPTOPRIL 50 MG PO TABS  TAKE ONE (1) TABLET                     THREE (3) TIMES EACH DAY 30 tablet 12  . FERROUS GLUCONATE 324 (38 FE) MG PO TABS Oral Take 324 mg  by mouth daily with breakfast.      . FUROSEMIDE 40 MG PO TABS Oral Take 1 tablet (40 mg total) by mouth 2 (two) times daily. 60 tablet 12  . HYDROCODONE-ACETAMINOPHEN 5-500 MG PO TABS Oral Take 1 tablet by mouth every 6 (six) hours as needed.      . IBUPROFEN 800 MG PO TABS Oral Take 800 mg by mouth as needed.      . ORTHOVISC 15 MG/ML IX SOLN      . VARDENAFIL HCL 20 MG PO TABS Oral Take 20 mg by mouth daily as needed.      Marland Kitchen VERAPAMIL HCL 80 MG PO TABS  TAKE ONE (1) TABLET                     THREE (3) TIMES EACH DAY 90 tablet 3    Triage Vitals: BP 136/65  Pulse 69  Temp 98.1 F (36.7 C) (Oral)  Resp 20  Ht 6' 3"  (1.905 m)  Wt 295 lb (133.811 kg)  BMI 36.87 kg/m2  SpO2 100%  Physical Exam  Nursing note and vitals reviewed. Constitutional: He is oriented to person, place, and time. He appears  well-developed and well-nourished. No distress.  HENT:  Head: Normocephalic and atraumatic.  Mouth/Throat: Oropharynx is clear and moist. No oropharyngeal exudate.  Eyes: EOM are normal. Pupils are equal, round, and reactive to light.  Neck: Neck supple. No tracheal deviation present.  Cardiovascular: Normal rate, regular rhythm and normal heart sounds.   No murmur heard. Pulmonary/Chest: Effort normal and breath sounds normal. No respiratory distress.  Abdominal: Soft. He exhibits no distension.  Musculoskeletal: Normal range of motion. He exhibits no edema.  Neurological: He is alert and oriented to person, place, and time.  Skin: Skin is warm and dry. Rash noted.       Skin redness. No hives.  Psychiatric: He has a normal mood and affect.    ED Course  Procedures (including critical care time) DIAGNOSTIC STUDIES: Oxygen Saturation is 100% on room air, normal by my interpretation.    COORDINATION OF CARE: 11:53--I evaluated the patient and we discussed a treatment plan including benadryl, prednisone, and epinephrine to which the pt agreed.    1:52 PM Pt's redness is gone.  He still is mildly itchy.  Advised not to take Niacin, as he had an adverse reaction to it.  Rx Benadryl 25 mg q4h prn itching, and prednisone 40 mg once a day for 3 days to prevent recurrence of the rash.    1. Allergic reaction   2. Adverse effects of medication     I personally performed the services described in this documentation, which was scribed in my presence. The recorded information has been reviewed and considered.  Katy Apo, M.D.         Mylinda Latina III, MD 10/24/12 [phone]

## 2012-11-12 ENCOUNTER — Telehealth: Payer: Self-pay | Admitting: Orthopedic Surgery

## 2012-11-12 NOTE — Telephone Encounter (Signed)
11/11/12 Spoke with patient about referral, and followed up with [facility], ph [phone]. 11/12/12 Re-Faxed referral for 2nd opinion to Attention: [facility], to fax# [phone].   Called back to patient to relay, to both ph#'s on file, neither # going through [phone] [facility]) / [phone] [facility]).

## 2012-11-14 ENCOUNTER — Inpatient Hospital Stay (HOSPITAL_COMMUNITY)
Admission: EM | Admit: 2012-11-14 | Discharge: 2012-11-17 | DRG: 247 | Disposition: A | Discharge status: Home / Self Care | Payer: Medicare Other | Attending: Cardiology | Admitting: Cardiology

## 2012-11-14 ENCOUNTER — Encounter (HOSPITAL_COMMUNITY): Payer: Self-pay | Admitting: Emergency Medicine

## 2012-11-14 ENCOUNTER — Emergency Department (HOSPITAL_COMMUNITY): Payer: Medicare Other

## 2012-11-14 DIAGNOSIS — E785 Hyperlipidemia, unspecified: Secondary | ICD-10-CM | POA: Diagnosis present

## 2012-11-14 DIAGNOSIS — Z7902 Long term (current) use of antithrombotics/antiplatelets: Secondary | ICD-10-CM

## 2012-11-14 DIAGNOSIS — I5032 Chronic diastolic (congestive) heart failure: Secondary | ICD-10-CM | POA: Diagnosis present

## 2012-11-14 DIAGNOSIS — R079 Chest pain, unspecified: Secondary | ICD-10-CM

## 2012-11-14 DIAGNOSIS — I2510 Atherosclerotic heart disease of native coronary artery without angina pectoris: Secondary | ICD-10-CM | POA: Diagnosis present

## 2012-11-14 DIAGNOSIS — Z7982 Long term (current) use of aspirin: Secondary | ICD-10-CM

## 2012-11-14 DIAGNOSIS — G4733 Obstructive sleep apnea (adult) (pediatric): Secondary | ICD-10-CM

## 2012-11-14 DIAGNOSIS — I10 Essential (primary) hypertension: Secondary | ICD-10-CM | POA: Diagnosis present

## 2012-11-14 DIAGNOSIS — Z87891 Personal history of nicotine dependence: Secondary | ICD-10-CM

## 2012-11-14 DIAGNOSIS — Z79899 Other long term (current) drug therapy: Secondary | ICD-10-CM

## 2012-11-14 DIAGNOSIS — Z23 Encounter for immunization: Secondary | ICD-10-CM

## 2012-11-14 DIAGNOSIS — R9431 Abnormal electrocardiogram [ECG] [EKG]: Secondary | ICD-10-CM

## 2012-11-14 DIAGNOSIS — N400 Enlarged prostate without lower urinary tract symptoms: Secondary | ICD-10-CM | POA: Diagnosis present

## 2012-11-14 DIAGNOSIS — E669 Obesity, unspecified: Secondary | ICD-10-CM | POA: Diagnosis present

## 2012-11-14 DIAGNOSIS — D631 Anemia in chronic kidney disease: Secondary | ICD-10-CM | POA: Diagnosis present

## 2012-11-14 DIAGNOSIS — N183 Chronic kidney disease, stage 3 unspecified: Secondary | ICD-10-CM | POA: Diagnosis present

## 2012-11-14 DIAGNOSIS — E7800 Pure hypercholesterolemia, unspecified: Secondary | ICD-10-CM | POA: Diagnosis present

## 2012-11-14 DIAGNOSIS — I129 Hypertensive chronic kidney disease with stage 1 through stage 4 chronic kidney disease, or unspecified chronic kidney disease: Secondary | ICD-10-CM | POA: Diagnosis present

## 2012-11-14 DIAGNOSIS — N529 Male erectile dysfunction, unspecified: Secondary | ICD-10-CM | POA: Diagnosis present

## 2012-11-14 DIAGNOSIS — E663 Overweight: Secondary | ICD-10-CM

## 2012-11-14 DIAGNOSIS — I509 Heart failure, unspecified: Secondary | ICD-10-CM | POA: Diagnosis present

## 2012-11-14 DIAGNOSIS — Z6836 Body mass index (BMI) 36.0-36.9, adult: Secondary | ICD-10-CM

## 2012-11-14 DIAGNOSIS — I214 Non-ST elevation (NSTEMI) myocardial infarction: Principal | ICD-10-CM

## 2012-11-14 DIAGNOSIS — Z955 Presence of coronary angioplasty implant and graft: Secondary | ICD-10-CM

## 2012-11-14 DIAGNOSIS — Z8679 Personal history of other diseases of the circulatory system: Secondary | ICD-10-CM

## 2012-11-14 DIAGNOSIS — M1710 Unilateral primary osteoarthritis, unspecified knee: Secondary | ICD-10-CM | POA: Diagnosis present

## 2012-11-14 HISTORY — DX: Atherosclerotic heart disease of native coronary artery without angina pectoris: I25.10

## 2012-11-14 LAB — CBC
HCT: 33.8 % — ABNORMAL LOW (ref 39.0–52.0)
Hemoglobin: 11.1 g/dL — ABNORMAL LOW (ref 13.0–17.0)
WBC: 8.6 10*3/uL (ref 4.0–10.5)

## 2012-11-14 LAB — BASIC METABOLIC PANEL
CO2: 24 mEq/L (ref 19–32)
Calcium: 9.4 mg/dL (ref 8.4–10.5)
Glucose, Bld: 154 mg/dL — ABNORMAL HIGH (ref 70–99)
Sodium: 138 mEq/L (ref 135–145)

## 2012-11-14 MED ORDER — SODIUM CHLORIDE 0.9 % IV SOLN
20.0000 mL | INTRAVENOUS | Status: DC
  Administered 2012-11-14: 20 mL via INTRAVENOUS

## 2012-11-14 MED ORDER — ASPIRIN 81 MG PO CHEW
324.0000 mg | CHEWABLE_TABLET | Freq: Once | ORAL | Status: AC
  Administered 2012-11-14: 324 mg via ORAL
  Filled 2012-11-14: qty 4

## 2012-11-14 MED ORDER — NITROGLYCERIN 0.4 MG SL SUBL
0.4000 mg | SUBLINGUAL_TABLET | SUBLINGUAL | Status: DC | PRN
  Administered 2012-11-14 (×2): 0.4 mg via SUBLINGUAL
  Filled 2012-11-14: qty 25

## 2012-11-14 NOTE — ED Notes (Signed)
Patient complaining of chest pain starting approximately 45 minutes ago. Denies shortness of breath or other symptoms. Denies radiation.

## 2012-11-14 NOTE — ED Provider Notes (Signed)
History     CSN: [id number]  Arrival date & time 11/14/12  1901   First MD Initiated Contact with Patient 11/14/12 1910      Chief Complaint  Patient presents with  . Chest Pain    (Consider location/radiation/quality/duration/timing/severity/associated sxs/prior treatment) [name] is a 66 y.o. male patient with prior history of chronic kidney disease and diastolic heart failure with an EF of 50% last by echocardiogram in 2010. Patient says she's never had a heart attack or a catheterization heart surgery. He does take a fluid pill and is currently complaining about pedal edema. 45 minutes prior to arrival, patient was sitting down watching football on television while eating popcorn when he developed a dull, pressure type sensation in the center of his chest. This did not radiate, it was 8/10 in severity, is not associated with nausea vomiting, diaphoresis, abdominal pain, dizziness, lightheadedness or shortness of breath. Pain is currently still there. There is no known exacerbating or alleviating factors.  Past Medical History  Diagnosis Date  . Diastolic heart failure     Nl EF; normal coronary arteries (1995); stress nuclear in 2/10 EF of 41%, no scarring or ischemia, EF of 50% by echo in 2010; pedal edema  . Hypertension     Exercise induced  . NSVT (nonsustained ventricular tachycardia)     Exercise induced  . 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 Obesity   . Degenerative joint disease   . Benign prostatic hypertrophy     Nocturia  . Erectile dysfunction   . Chronic kidney disease, stage 3, mod decreased GFR     Creatinine of 1.5 in 03/2009  . High cholesterol     Past Surgical History  Procedure Date  . Back surgery     Family History  Problem Relation Age of Onset  . Hypertension Other   . Cancer Other   . Heart disease    . Diabetes      History  Substance Use Topics  . Smoking status: Former Smoker -- 1.0 packs/day for 30  years    Quit date: 12/23/1992  . Smokeless tobacco: Not on file  . Alcohol Use: No      Review of Systems At least 10pt or greater review of systems completed and are negative except where specified in the HPI.  Allergies  Niacin and related  Home Medications   Current Outpatient Rx  Name  Route  Sig  Dispense  Refill  . ALPRAZOLAM 1 MG PO TABS   Oral   Take 0.5 mg by mouth 3 (three) times daily as needed. For anxiety         . ASPIRIN 81 MG PO CHEW   Oral   Chew 81 mg by mouth daily.         Marland Kitchen CAPTOPRIL 50 MG PO TABS   Oral   Take 50 mg by mouth 3 (three) times daily.         Marland Kitchen DIPHENHYDRAMINE HCL 25 MG PO CAPS   Oral   Take 1 capsule (25 mg total) by mouth every 6 (six) hours as needed for itching.   30 capsule   0   . FUROSEMIDE 40 MG PO TABS   Oral   Take 40 mg by mouth daily.         Marland Kitchen HYDROCODONE-ACETAMINOPHEN 5-325 MG PO TABS   Oral   Take 1 tablet by mouth every 6 (six) hours  as needed. For pain         . INDOMETHACIN 50 MG PO CAPS   Oral   Take 50 mg by mouth 2 (two) times daily as needed. For gout flares         . MELOXICAM 7.5 MG PO TABS   Oral   Take 15 mg by mouth daily.         Marland Kitchen PRAVASTATIN SODIUM 20 MG PO TABS   Oral   Take 20 mg by mouth daily.         Marland Kitchen PREDNISONE 10 MG PO TABS   Oral   Take 2 tablets (20 mg total) by mouth daily.   6 tablet   0   . VERAPAMIL HCL 80 MG PO TABS   Oral   Take 80 mg by mouth 4 (four) times daily.         Marland Kitchen VARDENAFIL HCL 20 MG PO TABS   Oral   Take 20 mg by mouth daily as needed. Erectile Dysfunction           BP 123/65  Pulse 67  Temp 98.3 F (36.8 C) (Oral)  Resp 19  Ht 6' 3"  (1.905 m)  Wt 295 lb (133.811 kg)  BMI 36.87 kg/m2  SpO2 98%  Physical Exam  Nursing notes reviewed.  Electronic medical record reviewed. VITAL SIGNS:   Filed Vitals:   11/14/12 1908 11/14/12 1910 11/14/12 2000 11/14/12 2100  BP:  175/66 137/63 123/65  Pulse:  82 67   Temp:  98.3 F  (36.8 C)    TempSrc:  Oral    Resp:  20 15 19   Height: 6' 3"  (1.905 m) 6' 3"  (1.905 m)    Weight: 295 lb (133.811 kg) 295 lb (133.811 kg)    SpO2:  99% 98%    CONSTITUTIONAL: Awake, oriented, appears non-toxic HENT: Atraumatic, normocephalic, oral mucosa pink and moist, airway patent. Nares patent without drainage. External ears normal. EYES: Conjunctiva clear, EOMI, PERRLA NECK: Trachea midline, non-tender, supple CARDIOVASCULAR: Normal heart rate, Normal rhythm, No murmurs, rubs, gallops PULMONARY/CHEST: Clear to auscultation, no rhonchi, wheezes, or rales. Symmetrical breath sounds. Non-tender. ABDOMINAL: Non-distended, soft, obese, non-tender - no rebound or guarding.  BS normal. NEUROLOGIC: Non-focal, moving all four extremities, no gross sensory or motor deficits. EXTREMITIES: No clubbing, cyanosis. 2+ edema lower extremities. Traumatic amputation left thumb-well healed. SKIN: Warm, Dry, No erythema, No rash  ED Course  Procedures (including critical care time)  Date: 11/14/2012 - time 1912  Rate: 78  Rhythm: normal sinus rhythm  QRS Axis: normal  Intervals: normal  ST/T Wave abnormalities: ST depression in V2 V3 and v4  Conduction Disutrbances: none  Narrative Interpretation: Ischemic changes in the precordial leads septal anterior V2 through V4-concern for posterior MI, leads V789 were obtained which showed no ST elevation    Date: 11/14/2012 time 2053  Rate: 83  Rhythm: normal sinus rhythm  QRS Axis: normal  Intervals: normal  ST/T Wave abnormalities: ST depression in V2 V3 and V4  Conduction Disutrbances: none  Narrative Interpretation:  Ischemic changes in the precordial leads septal anterior V2 through V4-concern for posterior MI, leads V789 were obtained which showed no ST elevation - minimal change from prior EKG    Labs Reviewed  CBC - Abnormal; Notable for the following:    RBC 3.52 (*)     Hemoglobin 11.1 (*)     HCT 33.8 (*)     All other components  within normal limits  BASIC METABOLIC PANEL - Abnormal; Notable for the following:    Glucose, Bld 154 (*)     BUN 24 (*)     Creatinine, Ser 1.39 (*)     GFR calc non Af Amer 51 (*)     GFR calc Af Amer 59 (*)     All other components within normal limits  TROPONIN I   Dg Chest Portable 1 View  11/14/2012  *RADIOLOGY REPORT*  Clinical Data: 66 year old male chest pain.  PORTABLE CHEST - 1 VIEW  Comparison: 02/15/2009.  Findings: Portable upright AP view 1949 hours.  Cardiomegaly not significantly changed. Other mediastinal contours are within normal limits.  Visualized tracheal air column is within normal limits. No pneumothorax, pleural effusion or consolidation.  Mild basilar crowding of lung markings and/or vascular congestion without overt edema.  IMPRESSION: Chronic cardiomegaly. Mild lung base atelectasis or vascular congestion.  No overt edema.   Original Report Authenticated By: Roselyn Reef, M.D.      1. Chest pain   2. EKG abnormalities   3. History of heart failure       MDM  [name] is a 66 y.o. male presenting with chest pain progress ST changes in the precordial leads V2 through V4 no changes on posterior leads V7-V9. Patient is almost chest pain free after nitroglycerin.  Labs are unremarkable, first set of troponins is negative. She is comfortable. Discussed with cardiology fellow for transfer to [facility]. Patient will at least need provocative testing.  Discussed with patient, he agrees to this medical plan, he'll be transferred to [facility].   On reassessment, patient's physical exam is not changed, his pain is currently a 1/10       Rhunette Croft, MD 11/14/12 2144

## 2012-11-15 ENCOUNTER — Encounter (HOSPITAL_COMMUNITY): Payer: Self-pay | Admitting: Internal Medicine

## 2012-11-15 DIAGNOSIS — I200 Unstable angina: Secondary | ICD-10-CM

## 2012-11-15 DIAGNOSIS — G4733 Obstructive sleep apnea (adult) (pediatric): Secondary | ICD-10-CM

## 2012-11-15 DIAGNOSIS — I214 Non-ST elevation (NSTEMI) myocardial infarction: Secondary | ICD-10-CM

## 2012-11-15 DIAGNOSIS — R079 Chest pain, unspecified: Secondary | ICD-10-CM

## 2012-11-15 LAB — CREATININE, SERUM
Creatinine, Ser: 1.32 mg/dL (ref 0.50–1.35)
GFR calc Af Amer: 63 mL/min — ABNORMAL LOW (ref >90)

## 2012-11-15 LAB — LIPID PANEL: Cholesterol: 121 mg/dL (ref 0–200)

## 2012-11-15 LAB — PRO B NATRIURETIC PEPTIDE: Pro B Natriuretic peptide (BNP): 86 pg/mL (ref 0–125)

## 2012-11-15 LAB — TROPONIN I: Troponin I: 9.02 ng/mL (ref <0.30)

## 2012-11-15 LAB — CBC
Hemoglobin: 9.8 g/dL — ABNORMAL LOW (ref 13.0–17.0)
MCH: 31.2 pg (ref 26.0–34.0)
MCV: 95.9 fL (ref 78.0–100.0)
Platelets: 200 10*3/uL (ref 150–400)
RBC: 3.14 MIL/uL — ABNORMAL LOW (ref 4.22–5.81)
WBC: 7.2 10*3/uL (ref 4.0–10.5)

## 2012-11-15 LAB — HEMOGLOBIN A1C: Hgb A1c MFr Bld: 6 % — ABNORMAL HIGH (ref <5.7)

## 2012-11-15 LAB — TSH: TSH: 1.054 u[IU]/mL (ref 0.350–4.500)

## 2012-11-15 MED ORDER — NITROGLYCERIN 0.4 MG SL SUBL
0.4000 mg | SUBLINGUAL_TABLET | SUBLINGUAL | Status: DC | PRN
  Administered 2012-11-16 (×2): 0.4 mg via SUBLINGUAL

## 2012-11-15 MED ORDER — ASPIRIN EC 81 MG PO TBEC
81.0000 mg | DELAYED_RELEASE_TABLET | Freq: Every day | ORAL | Status: DC
  Administered 2012-11-17: 10:00:00 81 mg via ORAL
  Filled 2012-11-15 (×2): qty 1

## 2012-11-15 MED ORDER — METOPROLOL TARTRATE 25 MG PO TABS
25.0000 mg | ORAL_TABLET | Freq: Two times a day (BID) | ORAL | Status: DC
  Filled 2012-11-15 (×3): qty 1

## 2012-11-15 MED ORDER — CAPTOPRIL 50 MG PO TABS
50.0000 mg | ORAL_TABLET | Freq: Three times a day (TID) | ORAL | Status: DC
  Administered 2012-11-15 – 2012-11-17 (×6): 50 mg via ORAL
  Filled 2012-11-15 (×10): qty 1

## 2012-11-15 MED ORDER — ENOXAPARIN SODIUM 60 MG/0.6ML ~~LOC~~ SOLN: 60.0000 mg | SUBCUTANEOUS | Status: DC

## 2012-11-15 MED ORDER — HYDROCODONE-ACETAMINOPHEN 5-325 MG PO TABS
1.0000 | ORAL_TABLET | Freq: Four times a day (QID) | ORAL | Status: DC | PRN
  Administered 2012-11-16: 21:00:00 1 via ORAL
  Filled 2012-11-15: qty 1

## 2012-11-15 MED ORDER — METOPROLOL TARTRATE 50 MG PO TABS
50.0000 mg | ORAL_TABLET | Freq: Two times a day (BID) | ORAL | Status: DC
  Administered 2012-11-15 – 2012-11-17 (×5): 50 mg via ORAL
  Filled 2012-11-15 (×6): qty 1

## 2012-11-15 MED ORDER — SODIUM CHLORIDE 0.9 % IV SOLN
1.0000 mL/kg/h | INTRAVENOUS | Status: DC
  Administered 2012-11-16: 1 mL/kg/h via INTRAVENOUS

## 2012-11-15 MED ORDER — ALPRAZOLAM 0.25 MG PO TABS
0.5000 mg | ORAL_TABLET | Freq: Three times a day (TID) | ORAL | Status: DC | PRN
  Administered 2012-11-15 – 2012-11-17 (×5): 0.5 mg via ORAL
  Filled 2012-11-15 (×7): qty 1

## 2012-11-15 MED ORDER — MELOXICAM 15 MG PO TABS
15.0000 mg | ORAL_TABLET | Freq: Every day | ORAL | Status: DC
  Administered 2012-11-15 – 2012-11-17 (×3): 15 mg via ORAL
  Filled 2012-11-15 (×3): qty 1

## 2012-11-15 MED ORDER — DIAZEPAM 5 MG PO TABS
5.0000 mg | ORAL_TABLET | ORAL | Status: AC
  Administered 2012-11-16: 5 mg via ORAL
  Filled 2012-11-15: qty 1

## 2012-11-15 MED ORDER — SIMVASTATIN 20 MG PO TABS
20.0000 mg | ORAL_TABLET | Freq: Every day | ORAL | Status: DC
  Administered 2012-11-15 – 2012-11-16 (×2): 20 mg via ORAL
  Filled 2012-11-15 (×3): qty 1

## 2012-11-15 MED ORDER — ASPIRIN 81 MG PO CHEW: 81.0000 mg | CHEWABLE_TABLET | Freq: Every day | ORAL | Status: DC

## 2012-11-15 MED ORDER — TICAGRELOR 90 MG PO TABS
90.0000 mg | ORAL_TABLET | Freq: Two times a day (BID) | ORAL | Status: DC
  Administered 2012-11-15 – 2012-11-17 (×3): 90 mg via ORAL
  Filled 2012-11-15 (×5): qty 1

## 2012-11-15 MED ORDER — FUROSEMIDE 40 MG PO TABS
40.0000 mg | ORAL_TABLET | Freq: Every day | ORAL | Status: DC
  Administered 2012-11-15 – 2012-11-16 (×2): 40 mg via ORAL
  Filled 2012-11-15 (×3): qty 1

## 2012-11-15 MED ORDER — SODIUM CHLORIDE 0.9 % IV SOLN: INTRAVENOUS | Status: DC

## 2012-11-15 MED ORDER — ONDANSETRON HCL 4 MG/2ML IJ SOLN: 4.0000 mg | Freq: Three times a day (TID) | INTRAMUSCULAR | Status: DC | PRN

## 2012-11-15 MED ORDER — ENOXAPARIN SODIUM 150 MG/ML ~~LOC~~ SOLN
140.0000 mg | Freq: Two times a day (BID) | SUBCUTANEOUS | Status: DC
  Administered 2012-11-15 (×2): 140 mg via SUBCUTANEOUS
  Filled 2012-11-15 (×4): qty 1

## 2012-11-15 MED ORDER — TICAGRELOR 90 MG PO TABS
180.0000 mg | ORAL_TABLET | Freq: Once | ORAL | Status: AC
  Administered 2012-11-15: 180 mg via ORAL
  Filled 2012-11-15 (×2): qty 2

## 2012-11-15 MED ORDER — ENOXAPARIN SODIUM 150 MG/ML ~~LOC~~ SOLN
140.0000 mg | SUBCUTANEOUS | Status: AC
  Administered 2012-11-15: 140 mg via SUBCUTANEOUS
  Filled 2012-11-15: qty 1

## 2012-11-15 MED ORDER — ACETAMINOPHEN 325 MG PO TABS: 650.0000 mg | ORAL_TABLET | ORAL | Status: DC | PRN

## 2012-11-15 MED ORDER — VERAPAMIL HCL 80 MG PO TABS
80.0000 mg | ORAL_TABLET | Freq: Four times a day (QID) | ORAL | Status: DC
  Filled 2012-11-15 (×5): qty 1

## 2012-11-15 MED ORDER — ONDANSETRON HCL 4 MG/2ML IJ SOLN: 4.0000 mg | Freq: Four times a day (QID) | INTRAMUSCULAR | Status: DC | PRN

## 2012-11-15 NOTE — Progress Notes (Signed)
Patient Name: [name]      SUBJECTIVE: 66 year old man admitted Saturday p.m. with chest pain. Has a history of hypertension and GERD we'll assess his LV function from 41-50% with a negative stress nuclear grade 2010. He also has chronic kidney disease and history of nonsustained ventricular tachycardia  He is in school he ruled in for non-STEMI; currently on aspirin and low molecular weight heparin.  Currently without pain.  He relates a story about his wife dying at [place] which was very difficult as her multiple questions left unresolved to what happened and the reasons  He has significant daytime somnolence hypertension and   Past Medical History  Diagnosis Date  . Diastolic heart failure     Nl EF; normal coronary arteries (1995); stress nuclear in 2/10 EF of 41%, no scarring or ischemia, EF of 50% by echo in 2010; pedal edema  . Hypertension     Exercise induced  . NSVT (nonsustained ventricular tachycardia)     Exercise induced  . 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 Obesity   . Degenerative joint disease   . Benign prostatic hypertrophy     Nocturia  . Erectile dysfunction   . Chronic kidney disease, stage 3, mod decreased GFR     Creatinine of 1.5 in 03/2009  . High cholesterol   . Non-STEMI (non-ST elevated myocardial infarction)     November 2013  . Obstructive sleep apnea     Probable sleep study to be done    PHYSICAL EXAM Filed Vitals:   11/14/12 2100 11/14/12 2200 11/14/12 2255 11/15/12 0600  BP: 123/65 150/73 164/66 157/68  Pulse:   79 74  Temp:   98.4 F (36.9 C) 98.8 F (37.1 C)  TempSrc:      Resp: 19 18 20 18   Height:   6' 3"  (1.905 m)   Weight:   305 lb 12.8 oz (138.71 kg)   SpO2:   100% 99%    Well developed and nourished in no acute distress HENT normal Neck supple with JVP-flat Carotids brisk and full without bruits Clear Regular rate and rhythm,   Abd-soft with active BS without  hepatomegaly No Clubbing cyanosis edema Skin-warm and dry A & Oriented  Grossly normal sensory and motor function  TELEMETRY: Reviewed telemetry pt in nsr:    Intake/Output Summary (Last 24 hours) at 11/15/12 0910 Last data filed at 11/15/12 0700  Gross per 24 hour  Intake    240 ml  Output    775 ml  Net   -535 ml    LABS: Basic Metabolic Panel:  Lab 45/80/99 0015 11/14/12 1929  NA -- 138  K -- 3.9  CL -- 101  CO2 -- 24  GLUCOSE -- 154*  BUN -- 24*  CREATININE 1.32 1.39*  CALCIUM -- 9.4  MG -- --  PHOS -- --   Cardiac Enzymes:  Basename 11/15/12 0625 11/15/12 0015 11/14/12 1929  CKTOTAL -- -- --  CKMB -- -- --  CKMBINDEX -- -- --  TROPONINI 9.86* 4.68* <0.30   CBC:  Lab 11/15/12 0015 11/14/12 1929  WBC 7.2 8.6  NEUTROABS -- --  HGB 9.8* 11.1*  HCT 30.1* 33.8*  MCV 95.9 96.0  PLT 200 218   PROTIME: No results found for this basename: LABPROT:3,INR:3 in the last 72 hours Liver Function Tests: No results found for this basename: AST:2,ALT:2,ALKPHOS:2,BILITOT:2,PROT:2,ALBUMIN:2 in the last 72 hours No results found  for this basename: LIPASE:2,AMYLASE:2 in the last 72 hours BNP: BNP (last 3 results)  Basename 11/15/12 0015  PROBNP 86.0   D-Dimer: No results found for this basename: DDIMER:2 in the last 72 hours Hemoglobin A1C: No results found for this basename: HGBA1C in the last 72 hours Fasting Lipid Panel:  Basename 11/15/12 0625  CHOL 121  HDL 36*  LDLCALC 74  TRIG 57  CHOLHDL 3.4  LDLDIRECT --     ASSESSMENT AND PLAN:  Patient Active Hospital Problem List: NSTEMI (non-ST elevated myocardial infarction) (11/15/2012)   Chronic kidney disease, stage 3, mod decreased GFR (08/23/2011)   Obstructive sleep apnea (11/15/2012)    Patient presents with chest pain and has a non-STEMI. Currently on Lovenox and aspirin. Will add ticagrelor. Catheterization scheduled for tomorrow. Currently on statin with LDL of 74.  Patient also has  symptoms supportive of a diagnosis of sleep apnea and will need an outpatient sleep study.  For now I will also discontinue his verapamil and uptitrated his beta blocker.  Have reviewed with him also some of the issues related to his wife dying at [place] 3 years ago. Communication about his own condition will be very very important, as always.  He follows up with Dr. Lattie Haw    Signed, Virl Axe MD  11/15/2012

## 2012-11-15 NOTE — Progress Notes (Signed)
ANTICOAGULATION CONSULT NOTE - Initial Consult  Pharmacy Consult for Lovenox Indication: chest pain/ACS  Allergies  Allergen Reactions  . Niacin And Related Other (See Comments)    Reaction: flushing and burning side effects even with extended release    Patient Measurements: Height: 6' 3"  (190.5 cm) Weight: 305 lb 12.8 oz (138.71 kg) IBW/kg (Calculated) : 84.5   Vital Signs: Temp: 98.4 F (36.9 C) (11/23 2255) Temp src: Oral (11/23 1910) BP: 164/66 mmHg (11/23 2255) Pulse Rate: 79  (11/23 2255)  Labs:  Basename 11/15/12 0015 11/14/12 1929  HGB 9.8* 11.1*  HCT 30.1* 33.8*  PLT 200 218  APTT -- --  LABPROT -- --  INR -- --  HEPARINUNFRC -- --  CREATININE 1.32 1.39*  CKTOTAL -- --  CKMB -- --  TROPONINI 4.68* <0.30    Estimated Creatinine Clearance: 81.6 ml/min (by C-G formula based on Cr of 1.32).   Medical History: Past Medical History  Diagnosis Date  . Diastolic heart failure     Nl EF; normal coronary arteries (1995); stress nuclear in 2/10 EF of 41%, no scarring or ischemia, EF of 50% by echo in 2010; pedal edema  . Hypertension     Exercise induced  . NSVT (nonsustained ventricular tachycardia)     Exercise induced  . 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 Obesity   . Degenerative joint disease   . Benign prostatic hypertrophy     Nocturia  . Erectile dysfunction   . Chronic kidney disease, stage 3, mod decreased GFR     Creatinine of 1.5 in 03/2009  . High cholesterol     Medications:  Prescriptions prior to admission  Medication Sig Dispense Refill  . ALPRAZolam (XANAX) 1 MG tablet Take 0.5 mg by mouth 3 (three) times daily as needed. For anxiety      . aspirin 81 MG chewable tablet Chew 81 mg by mouth daily.      . captopril (CAPOTEN) 50 MG tablet Take 50 mg by mouth 3 (three) times daily.      . diphenhydrAMINE (BENADRYL) 25 mg capsule Take 1 capsule (25 mg total) by mouth every 6 (six) hours as needed for itching.  30  capsule  0  . furosemide (LASIX) 40 MG tablet Take 40 mg by mouth daily.      Marland Kitchen HYDROcodone-acetaminophen (NORCO/VICODIN) 5-325 MG per tablet Take 1 tablet by mouth every 6 (six) hours as needed. For pain      . indomethacin (INDOCIN) 50 MG capsule Take 50 mg by mouth 2 (two) times daily as needed. For gout flares      . meloxicam (MOBIC) 7.5 MG tablet Take 15 mg by mouth daily.      . pravastatin (PRAVACHOL) 20 MG tablet Take 20 mg by mouth daily.      . predniSONE (DELTASONE) 10 MG tablet Take 2 tablets (20 mg total) by mouth daily.  6 tablet  0  . verapamil (CALAN) 80 MG tablet Take 80 mg by mouth 4 (four) times daily.      . vardenafil (LEVITRA) 20 MG tablet Take 20 mg by mouth daily as needed. Erectile Dysfunction       Scheduled:    . [COMPLETED] aspirin  324 mg Oral Once  . aspirin EC  81 mg Oral Daily  . captopril  50 mg Oral TID  . enoxaparin (LOVENOX) injection  140 mg Subcutaneous NOW  . enoxaparin (LOVENOX) injection  140 mg Subcutaneous Q12H  . furosemide  40 mg Oral Daily  . meloxicam  15 mg Oral Daily  . metoprolol tartrate  25 mg Oral BID  . simvastatin  20 mg Oral q1800  . verapamil  80 mg Oral QID  . [DISCONTINUED] aspirin  81 mg Oral Daily  . [DISCONTINUED] enoxaparin (LOVENOX) injection  60 mg Subcutaneous Q24H    Assessment: 66yo male c/o sudden onset of CP lasting 30-33mn, initial troponin negative and now 4.68, to begin Lovenox.  Goal of Therapy:  Anti-Xa level 0.6-1.2 units/ml 4hrs after LMWH dose given Monitor platelets by anticoagulation protocol: Yes   Plan:  Will begin Lovenox 1474mSQ Q12H and monitor CBC.  BrRogue BussingharmD BCPS 11/15/2012,1:50 AM

## 2012-11-15 NOTE — Plan of Care (Signed)
Problem: Phase I Progression Outcomes Goal: Anginal pain relieved Outcome: Progressing 1233 Pt. C/o of CP B/P 178/81 after watching cath/pci video. Pt. Very anxious for procedure in AM. Cont. To talk and give information to pt. Xanax given with noted improvement and decreased anxiety. Will cont. To monitor.

## 2012-11-15 NOTE — H&P (Addendum)
[name] is an 66 y.o. male.    Chief Complaint: Chest Pain  HPI: 66 y/o male with history of HTN presenting for chest pain evaluation.  He was watching a football game on television and eating popcorn when he developed 8/10 chest pain that he described as a dull pain, lasted 30 to 45 minutes before relief.  He denies any shortness of breath, diaphoresis, nausea, vomiting, PND, orthopnea or syncope. He was initially seen at [facility] where his initial cardiac marker is negative. He received SL NTG with complete resolution of his chest pain.  His ECG showed sinus rhythm with T-wave inversion in lead I, aVL (new).  Currently, he is asymptomatic. Review of his medical record showed TTE from 03/30/2009: LVEF 50-55%  Past Medical History  Diagnosis Date  . Diastolic heart failure     Nl EF; normal coronary arteries (1995); stress nuclear in 2/10 EF of 41%, no scarring or ischemia, EF of 50% by echo in 2010; pedal edema  . Hypertension     Exercise induced  . NSVT (nonsustained ventricular tachycardia)     Exercise induced  . 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 Obesity   . Degenerative joint disease   . Benign prostatic hypertrophy     Nocturia  . Erectile dysfunction   . Chronic kidney disease, stage 3, mod decreased GFR     Creatinine of 1.5 in 03/2009  . High cholesterol     Past Surgical History  Procedure Date  . Back surgery     Family History  Problem Relation Age of Onset  . Hypertension Other   . Cancer Other   . Heart disease    . Diabetes     Social History:  reports that he quit smoking about 19 years ago. He does not have any smokeless tobacco history on file. He reports that he does not drink alcohol or use illicit drugs.  Allergies:  Allergies  Allergen Reactions  . Niacin And Related Other (See Comments)    Reaction: flushing and burning side effects even with extended release    Medications Prior to Admission  Medication Sig  Dispense Refill  . ALPRAZolam (XANAX) 1 MG tablet Take 0.5 mg by mouth 3 (three) times daily as needed. For anxiety      . aspirin 81 MG chewable tablet Chew 81 mg by mouth daily.      . captopril (CAPOTEN) 50 MG tablet Take 50 mg by mouth 3 (three) times daily.      . diphenhydrAMINE (BENADRYL) 25 mg capsule Take 1 capsule (25 mg total) by mouth every 6 (six) hours as needed for itching.  30 capsule  0  . furosemide (LASIX) 40 MG tablet Take 40 mg by mouth daily.      Marland Kitchen HYDROcodone-acetaminophen (NORCO/VICODIN) 5-325 MG per tablet Take 1 tablet by mouth every 6 (six) hours as needed. For pain      . indomethacin (INDOCIN) 50 MG capsule Take 50 mg by mouth 2 (two) times daily as needed. For gout flares      . meloxicam (MOBIC) 7.5 MG tablet Take 15 mg by mouth daily.      . pravastatin (PRAVACHOL) 20 MG tablet Take 20 mg by mouth daily.      . predniSONE (DELTASONE) 10 MG tablet Take 2 tablets (20 mg total) by mouth daily.  6 tablet  0  . verapamil (CALAN) 80 MG tablet Take 80  mg by mouth 4 (four) times daily.      . vardenafil (LEVITRA) 20 MG tablet Take 20 mg by mouth daily as needed. Erectile Dysfunction        Results for orders placed during the hospital encounter of 11/14/12 (from the past 48 hour(s))  CBC     Status: Abnormal   Collection Time   11/14/12  7:29 PM      Component Value Range Comment   WBC 8.6  4.0 - 10.5 K/uL    RBC 3.52 (*) 4.22 - 5.81 MIL/uL    Hemoglobin 11.1 (*) 13.0 - 17.0 g/dL    HCT 33.8 (*) 39.0 - 52.0 %    MCV 96.0  78.0 - 100.0 fL    MCH 31.5  26.0 - 34.0 pg    MCHC 32.8  30.0 - 36.0 g/dL    RDW 15.4  11.5 - 15.5 %    Platelets 218  150 - 400 K/uL   TROPONIN I     Status: Normal   Collection Time   11/14/12  7:29 PM      Component Value Range Comment   Troponin I <0.30  <0.30 ng/mL   BASIC METABOLIC PANEL     Status: Abnormal   Collection Time   11/14/12  7:29 PM      Component Value Range Comment   Sodium 138  135 - 145 mEq/L    Potassium 3.9   3.5 - 5.1 mEq/L    Chloride 101  96 - 112 mEq/L    CO2 24  19 - 32 mEq/L    Glucose, Bld 154 (*) 70 - 99 mg/dL    BUN 24 (*) 6 - 23 mg/dL    Creatinine, Ser 1.39 (*) 0.50 - 1.35 mg/dL    Calcium 9.4  8.4 - 10.5 mg/dL    GFR calc non Af Amer 51 (*) >90 mL/min    GFR calc Af Amer 59 (*) >90 mL/min    Dg Chest Portable 1 View  11/14/2012  *RADIOLOGY REPORT*  Clinical Data: 66 year old male chest pain.  PORTABLE CHEST - 1 VIEW  Comparison: 02/15/2009.  Findings: Portable upright AP view 1949 hours.  Cardiomegaly not significantly changed. Other mediastinal contours are within normal limits.  Visualized tracheal air column is within normal limits. No pneumothorax, pleural effusion or consolidation.  Mild basilar crowding of lung markings and/or vascular congestion without overt edema.  IMPRESSION: Chronic cardiomegaly. Mild lung base atelectasis or vascular congestion.  No overt edema.   Original Report Authenticated By: Roselyn Reef, M.D.     Review of Systems  Constitutional: Negative for fever, chills, weight loss, malaise/fatigue and diaphoresis.  HENT: Negative for hearing loss, ear pain, nosebleeds, congestion, neck pain, tinnitus and ear discharge.   Eyes: Negative for blurred vision, double vision, photophobia, pain, discharge and redness.  Respiratory: Negative for cough, hemoptysis, sputum production, shortness of breath, wheezing and stridor.   Cardiovascular: Positive for chest pain. Negative for palpitations, orthopnea, claudication, leg swelling and PND.  Gastrointestinal: Negative for heartburn, nausea, vomiting, abdominal pain, diarrhea, constipation and blood in stool.  Genitourinary: Negative for dysuria, urgency, frequency and hematuria.  Musculoskeletal: Positive for back pain. Negative for myalgias and joint pain.  Skin: Negative for itching and rash.  Neurological: Negative for dizziness, tingling, tremors, sensory change, speech change, focal weakness, seizures and  headaches.  Endo/Heme/Allergies: Negative for environmental allergies and polydipsia.  Psychiatric/Behavioral: Negative for depression and hallucinations.    Blood pressure 164/66, pulse  79, temperature 98.4 F (36.9 C), temperature source Oral, resp. rate 20, height 6' 3"  (1.905 m), weight 138.71 kg (305 lb 12.8 oz), SpO2 100.00%. Physical Exam  Constitutional: He is oriented to person, place, and time. He appears well-developed and well-nourished. No distress.  HENT:  Head: Normocephalic.  Eyes: EOM are normal. Right eye exhibits no discharge. Left eye exhibits no discharge. No scleral icterus.  Neck: Normal range of motion. Neck supple. No JVD present. No tracheal deviation present. No thyromegaly present.  Cardiovascular: Normal rate, regular rhythm and normal heart sounds.  Exam reveals no gallop and no friction rub.   No murmur heard. Respiratory: No stridor. No respiratory distress. He has no wheezes. He has no rales.  GI: He exhibits no distension. There is no tenderness. There is no rebound.  Musculoskeletal: He exhibits edema. He exhibits no tenderness.  Neurological: He is alert and oriented to person, place, and time.  Skin: No rash noted. He is not diaphoretic. No erythema.  Psychiatric: He has a normal mood and affect.    ECG: sinus rhythm (76 bpm) with T-wave inversion in leads I and aVL. Assessment/Plan  1. Unstable angina 2. HTN 3. Left ventricular diastolic dysfunction  I will admit the patient to cardiology service to be observed on telemetry. I will obtain serial cardiac markers to rule out MI, and start the patient on Aspirin, Beta blocker and Statin.  I will start the patient on Lovenox and keep him NPO for a cardiac catheterization on Monday.    Iver Miklas E 11/15/2012, 12:26 AM

## 2012-11-15 NOTE — Progress Notes (Signed)
CRITICAL VALUE ALERT  Critical value received: Troponin 4.68  Date of notification:  11/15/2012  Time of notification:  0123  Critical value read back:yes  Nurse who received alert:  Hoover Brunette  MD notified (1st page):  Bary Leriche, MD  Time of first page:  0124  MD notified (2nd page):  Time of second page:  Responding MD: Bary Leriche, MD  Time MD responded:  0454   No new orders given at this time. Pt asymptomatic and sleeping. Will continue to monitor the pt. Hoover Brunette

## 2012-11-15 NOTE — Plan of Care (Signed)
Problem: Consults Goal: Cardiac Cath Patient Education (See Patient Education module for education specifics.) Outcome: Completed/Met Date Met:  11/15/12 Watched education video 115 brochure given

## 2012-11-15 NOTE — Progress Notes (Signed)
Ordered to give dose of lovenox now & a cbc. Will continue to monitor the pt. Hoover Brunette

## 2012-11-16 ENCOUNTER — Encounter (HOSPITAL_COMMUNITY)
Admission: EM | Disposition: A | Discharge status: Home / Self Care | Payer: Self-pay | Source: Home / Self Care | Attending: Cardiology

## 2012-11-16 DIAGNOSIS — I214 Non-ST elevation (NSTEMI) myocardial infarction: Secondary | ICD-10-CM

## 2012-11-16 DIAGNOSIS — I2510 Atherosclerotic heart disease of native coronary artery without angina pectoris: Secondary | ICD-10-CM

## 2012-11-16 HISTORY — PX: PERCUTANEOUS CORONARY STENT INTERVENTION (PCI-S): SHX5485

## 2012-11-16 HISTORY — PX: LEFT HEART CATHETERIZATION WITH CORONARY ANGIOGRAM: SHX5451

## 2012-11-16 LAB — CBC
MCH: 31.6 pg (ref 26.0–34.0)
MCHC: 33.3 g/dL (ref 30.0–36.0)
MCV: 94.9 fL (ref 78.0–100.0)
Platelets: 214 10*3/uL (ref 150–400)
RBC: 3.16 MIL/uL — ABNORMAL LOW (ref 4.22–5.81)

## 2012-11-16 LAB — PROTIME-INR: Prothrombin Time: 13.1 seconds (ref 11.6–15.2)

## 2012-11-16 LAB — POCT ACTIVATED CLOTTING TIME: Activated Clotting Time: 389 seconds

## 2012-11-16 SURGERY — LEFT HEART CATHETERIZATION WITH CORONARY ANGIOGRAM
Anesthesia: LOCAL

## 2012-11-16 MED ORDER — NITROGLYCERIN 0.2 MG/ML ON CALL CATH LAB
INTRAVENOUS | Status: AC
  Filled 2012-11-16: qty 1

## 2012-11-16 MED ORDER — LIDOCAINE HCL (PF) 1 % IJ SOLN
INTRAMUSCULAR | Status: AC
  Filled 2012-11-16: qty 30

## 2012-11-16 MED ORDER — SODIUM CHLORIDE 0.9 % IV SOLN: 250.0000 mL | INTRAVENOUS | Status: DC

## 2012-11-16 MED ORDER — BIVALIRUDIN 250 MG IV SOLR
INTRAVENOUS | Status: AC
  Filled 2012-11-16: qty 250

## 2012-11-16 MED ORDER — SODIUM CHLORIDE 0.9 % IV SOLN
0.2500 mg/kg/h | INTRAVENOUS | Status: DC
  Filled 2012-11-16: qty 250

## 2012-11-16 MED ORDER — MIDAZOLAM HCL 2 MG/2ML IJ SOLN
INTRAMUSCULAR | Status: AC
  Filled 2012-11-16: qty 2

## 2012-11-16 MED ORDER — ASPIRIN 81 MG PO CHEW
CHEWABLE_TABLET | ORAL | Status: AC
  Filled 2012-11-16: qty 1

## 2012-11-16 MED ORDER — FENTANYL CITRATE 0.05 MG/ML IJ SOLN
INTRAMUSCULAR | Status: AC
  Filled 2012-11-16: qty 2

## 2012-11-16 MED ORDER — SODIUM CHLORIDE 0.9 % IJ SOLN: 3.0000 mL | INTRAMUSCULAR | Status: DC | PRN

## 2012-11-16 MED ORDER — HEPARIN SODIUM (PORCINE) 1000 UNIT/ML IJ SOLN
INTRAMUSCULAR | Status: AC
  Filled 2012-11-16: qty 1

## 2012-11-16 MED ORDER — ONDANSETRON HCL 4 MG/2ML IJ SOLN: 4.0000 mg | Freq: Four times a day (QID) | INTRAMUSCULAR | Status: DC | PRN

## 2012-11-16 MED ORDER — SODIUM CHLORIDE 0.9 % IV SOLN
1.0000 mL/kg/h | INTRAVENOUS | Status: AC
  Administered 2012-11-16: 12:00:00 1 mL/kg/h via INTRAVENOUS

## 2012-11-16 MED ORDER — SODIUM CHLORIDE 0.9 % IJ SOLN: 3.0000 mL | Freq: Two times a day (BID) | INTRAMUSCULAR | Status: DC

## 2012-11-16 MED ORDER — HEPARIN (PORCINE) IN NACL 2-0.9 UNIT/ML-% IJ SOLN
INTRAMUSCULAR | Status: AC
  Filled 2012-11-16: qty 1500

## 2012-11-16 MED ORDER — ACETAMINOPHEN 325 MG PO TABS
650.0000 mg | ORAL_TABLET | ORAL | Status: DC | PRN
  Administered 2012-11-16: 650 mg via ORAL
  Filled 2012-11-16: qty 2

## 2012-11-16 MED ORDER — OXYCODONE-ACETAMINOPHEN 5-325 MG PO TABS: 1.0000 | ORAL_TABLET | ORAL | Status: DC | PRN

## 2012-11-16 MED ORDER — TICAGRELOR 90 MG PO TABS
ORAL_TABLET | ORAL | Status: AC
  Filled 2012-11-16: qty 1

## 2012-11-16 MED ORDER — VERAPAMIL HCL 2.5 MG/ML IV SOLN
INTRAVENOUS | Status: AC
  Filled 2012-11-16: qty 2

## 2012-11-16 NOTE — H&P (View-Only) (Signed)
Patient ID: [name], male   DOB: [birth year], 67 y.o.   MRN: [id number]   Plans have been made for the patient to undergo cardiac catheterization today. I have checked him the orders have been written. I checked with the cath lab to be sure that the list was updated and he was listed for cath today. This has all been taken care of.  Daryel November, MD

## 2012-11-16 NOTE — Progress Notes (Signed)
Patient arrived to floor post cath with chest pain 5 out of 10. Vitals stable EKG done 1 SL nitro given. Pain relieved I will continue to monitor.Call bell within reach.

## 2012-11-16 NOTE — Progress Notes (Signed)
At 0435 pt c/o 2/10 chest pressure to lt side of chest. BP 174/84 Hr 80's. Pt given 1 SL NTG and at 0440 pt sates pain right at a 0/10. BP 154/77 Hr 83. Will cont to monitor closely.

## 2012-11-16 NOTE — Progress Notes (Signed)
TR BAND REMOVAL  LOCATION:  right radial  DEFLATED PER PROTOCOL:  yes  TIME BAND OFF / DRESSING APPLIED:   1545   SITE UPON ARRIVAL:   Level 1  SITE AFTER BAND REMOVAL:  Level 0  REVERSE ALLEN'S TEST:    positive  CIRCULATION SENSATION AND MOVEMENT:  Within Normal Limits  yes  COMMENTS:

## 2012-11-16 NOTE — Interval H&P Note (Signed)
History and Physical Interval Note:  11/16/2012 9:26 AM  [name]  has presented today for surgery, with the diagnosis of cp  The various methods of treatment have been discussed with the patient and family. After consideration of risks, benefits and other options for treatment, the patient has consented to  Procedure(s) (LRB) with comments: LEFT HEART CATHETERIZATION WITH CORONARY ANGIOGRAM (N/A) as a surgical intervention .  The patient's history has been reviewed, patient examined, no change in status, stable for surgery.  I have reviewed the patient's chart and labs.  Questions were answered to the patient's satisfaction.     Sherren Mocha

## 2012-11-16 NOTE — CV Procedure (Signed)
Cardiac Catheterization Procedure Note  Name: [name] MRN: [id number] DOB: 12-07-1946  Procedure: Left Heart Cath, Selective Coronary Angiography, LV angiography, PTCA and stenting of the first diagonal branch, PTCA and stenting of the mid LAD.  Indication: Non-ST elevation MI  Procedural Details:  The right wrist was prepped, draped, and anesthetized with 1% lidocaine. Using the modified Seldinger technique, a 5 French sheath was introduced into the right radial artery. 3 mg of verapamil was administered through the sheath, weight-based unfractionated heparin was administered intravenously. Standard Judkins catheters were used for selective coronary angiography and left ventriculography. Catheter exchanges were performed over an exchange length guidewire.  PROCEDURAL FINDINGS Hemodynamics: AO 165/87 LV 164/19   Coronary angiography: Coronary dominance: right  Left mainstem: The left main stem is widely patent with minor luminal irregularity.  Left anterior descending (LAD): The LAD is patent to the left ventricular apex. The vessel is diffusely diseased. The proximal LAD has 40% stenosis just beyond the first diagonal origin. The first diagonal has 95% high-grade stenosis with characteristics suspicious for a "culprit lesion." The lesion is hypodense angiographically. The mid LAD has a focal eccentric 80% stenosis with hypodensity as well. Further down at the junction of the mid and distal LAD there is diffuse 50% stenosis.  Left circumflex (LCx): The left circumflex is patent. The vessel has diffuse plaque without high-grade disease. There are 2 obtuse marginal branches with 50% stenoses in the area of the OM bifurcation.  Right coronary artery (RCA): The RCA is dominant. There is diffuse 40-50% proximal stenosis. There are diffuse irregularities throughout the mid and distal vessel. The PDA branch has 75% proximal. The PLA branch has nonobstructive disease noted.  Left  ventriculography: Left ventricular systolic function shows hypokinesis of the mid anterolateral wall. The left ventricular ejection fraction is 55%.  PCI Note:  Following the diagnostic procedure, the decision was made to proceed with PCI. The patient has severe focal stenosis in the first diagonal. He also had severe stenosis in the mid LAD. I elected to treat both lesions in the setting of his non-ST elevation infarction as I couldn't be certain which was the culprit. The radial sheath was upsized to a 6 [place]. Weight-based bivalirudin was given for anticoagulation. The patient was given aspirin 81 mg and brilinta 90 mg on the Cath Lab table. He had been loaded last night with brilinta 180 mg. Once a therapeutic ACT was achieved, a 6 [place] XB LAD 3.5 cm guide catheter was inserted.  A pro-water coronary guidewire was used to cross the lesion in the diagonal .  The lesion was predilated with a 2.0 x 15 mm balloon.  The lesion was then stented with a 2.5 x 15 mm Xience drug-eluting stent.  The stent was postdilated with a 2.75 mm noncompliant balloon.  Following PCI, there was 0% residual stenosis and TIMI-3 flow. Attention was then turned to the mid LAD. The wire was redirected down the LAD without much difficulty. The lesion was then primarily stented with a 3.5 x 15 mm Xience DES. The stent was postdilated with a 3.75 x 12 mm noncompliant balloon to 16 atmospheres. Final angiography confirmed an excellent result. The patient tolerated the procedure well. At the completion of the procedure, there was 0% residual stenosis and TIMI-3 flow in both lesion sites. There were no immediate procedural complications. A TR band was used for radial hemostasis. The patient was transferred to the post catheterization recovery area for further monitoring.  PCI Data: LESION  1:  Vessel - Diagonal/Segment - mid Percent Stenosis (pre)  95 TIMI-flow 3 Stent 2.5x15 Xience DES Percent Stenosis (post) 0 TIMI-flow (post)  3  Vessel - LAD/Segment - Mid Percent Stenosis (pre)  80 TIMI-flow 3 Stent 3.5x15 Xience DES Percent Stenosis (post) 0 TIMI-flow (post) 3  Final Conclusions:   1. Severe stenoses of the LAD and first diagonal branches, treated successfully with drug-eluting stents 2. Diffuse nonobstructive stenoses of the left circumflex and right coronary artery 3. Mild segmental left ventricular dysfunction with overall preserved LVEF   Recommendations:  Aggressive medical therapy, continue dual antiplatelet therapy with aspirin and brilinta for 12 months.  Sherren Mocha 11/16/2012, 11:03 AM

## 2012-11-16 NOTE — Progress Notes (Signed)
Pt assessed and ready for transfer to cath lab. VSS, pain assessed (c/o chest pressure 2/10), Valium given.

## 2012-11-16 NOTE — Progress Notes (Signed)
Patient ID: [name], male   DOB: August 19, 1946, 66 y.o.   MRN: [id number]   Plans have been made for the patient to undergo cardiac catheterization today. I have checked him the orders have been written. I checked with the cath lab to be sure that the list was updated and he was listed for cath today. This has all been taken care of.  Daryel November, MD

## 2012-11-17 ENCOUNTER — Other Ambulatory Visit: Payer: Self-pay | Admitting: Cardiology

## 2012-11-17 ENCOUNTER — Encounter (HOSPITAL_COMMUNITY): Payer: Self-pay | Admitting: Physician Assistant

## 2012-11-17 ENCOUNTER — Encounter: Payer: Self-pay | Admitting: *Deleted

## 2012-11-17 DIAGNOSIS — I2510 Atherosclerotic heart disease of native coronary artery without angina pectoris: Secondary | ICD-10-CM

## 2012-11-17 DIAGNOSIS — E663 Overweight: Secondary | ICD-10-CM

## 2012-11-17 DIAGNOSIS — D649 Anemia, unspecified: Secondary | ICD-10-CM

## 2012-11-17 DIAGNOSIS — N186 End stage renal disease: Secondary | ICD-10-CM

## 2012-11-17 LAB — CBC
Hemoglobin: 10.1 g/dL — ABNORMAL LOW (ref 13.0–17.0)
MCH: 31.5 pg (ref 26.0–34.0)
MCHC: 33.1 g/dL (ref 30.0–36.0)
Platelets: 219 10*3/uL (ref 150–400)
RDW: 15.2 % (ref 11.5–15.5)

## 2012-11-17 LAB — BASIC METABOLIC PANEL
Calcium: 9.1 mg/dL (ref 8.4–10.5)
GFR calc Af Amer: 53 mL/min — ABNORMAL LOW (ref >90)
GFR calc non Af Amer: 45 mL/min — ABNORMAL LOW (ref >90)
Potassium: 3.6 mEq/L (ref 3.5–5.1)
Sodium: 137 mEq/L (ref 135–145)

## 2012-11-17 MED ORDER — METOPROLOL TARTRATE 50 MG PO TABS: 50.0000 mg | ORAL_TABLET | Freq: Two times a day (BID) | ORAL | Status: DC

## 2012-11-17 MED ORDER — TICAGRELOR 90 MG PO TABS: 90.0000 mg | ORAL_TABLET | Freq: Two times a day (BID) | ORAL | Status: DC

## 2012-11-17 MED ORDER — NITROGLYCERIN 0.4 MG SL SUBL: 0.4000 mg | SUBLINGUAL_TABLET | SUBLINGUAL | Status: DC | PRN

## 2012-11-17 MED ORDER — PREDNISONE 20 MG PO TABS
20.0000 mg | ORAL_TABLET | Freq: Every day | ORAL | Status: DC
  Filled 2012-11-17: qty 1

## 2012-11-17 MED ORDER — VARDENAFIL HCL 20 MG PO TABS: 20.0000 mg | ORAL_TABLET | Freq: Every day | ORAL | Status: DC | PRN

## 2012-11-17 MED FILL — Dextrose Inj 5%: INTRAVENOUS | Qty: 50 | Status: AC

## 2012-11-17 NOTE — Progress Notes (Signed)
CARDIAC REHAB PHASE I   PRE:  Rate/Rhythm: 77 SR  BP:  Supine:   Sitting: 175/66  Standing:    SaO2: 100 RA  MODE:  Ambulation: 300 ft   POST:  Rate/Rhythem: 93 SR  BP:  Supine:   Sitting: 156/79  Standing:    SaO2:  3736-6815 Assisted X 1 and used walker to ambulate. Gait steady with walker, slow pace. VS stable. Pt c/o of knee pain with walking especially right one.He was able to walk 300 feet without c/o of cp. Pt back to side of bed after walk with call light in reach. Completed MI education with pt. He voices understanding. Pt agrees to [place]. CRP in [place], will send referral.  [name]

## 2012-11-17 NOTE — Progress Notes (Signed)
Nursing called because pt had question about allopurinol. He thinks he is on this at home but does not know the dose. It is not on his home med list. He was instructed to verify the medication at home and to let our office know what dose he is taking so we have this for our records. The patient was instructed that allopurinol is safe to continue with his other medications. Dayna Dunn PA-C

## 2012-11-17 NOTE — Progress Notes (Signed)
Patient remains in the hospital, at this time.  Requisitions printed and a letter will be sent out today for patient to have done day prior to follow up appointment on 12/3.

## 2012-11-17 NOTE — Discharge Summary (Signed)
Discharge Summary   Patient ID: [name] MRN: [id number], DOB/AGE: 09-07-1946 66 y.o. Admit date: 11/14/2012 D/C date:     11/17/2012  Primary Cardiologist: [facility]  Primary Discharge Diagnoses:  1. Newly diagnosed CAD with NSTEMI this admission s/p DES to LAD & DES to 1st diagonal with residual diffuse nonobstructive dz in LCx/RCA  2. CKD stage III 3. HTN  4. Suspected sleep apnea - will need OP sleep study.  5. Normocytic anemia, suspected related to renal disease 6. Arthritis, on chronic prednisone 7. Chronic diastolic CHF 8. Obesity BMI 36.1  Secondary Discharge Diagnoses:  1. HL 2. H/o NSVT, exercise-induced 3. Tobacco abuse 4. DJD 5. BPH 6. Erectile dysfunction  Hospital Course: [name] is a 66 y/o male with history of HTN, CKD, chronic diastolic CHF who presented to [facility] 11/15/12 with complaints of chest pain. He was watching a football game on television and eating popcorn when he developed 8/10 chest pain that he described as a dull pain, lasted 30 to 45 minutes before relief. He denied any shortness of breath, diaphoresis, nausea, vomiting, PND, orthopnea or syncope. He was initially seen at [facility] where his initial cardiac marker is negative. He received SL NTG with complete resolution of his chest pain. His ECG showed sinus rhythm with new T-wave inversion in lead I, aVL thus he was sent over to [facility] for further evaluation. Initial troponin was negative but follow-up sets demonstrated NSTEMI with peak troponin 9.86. pBNP WNL. He was loaded with Brilinta. Verapamil was discontinued in favor of starting beta blocker therapy with metoprolol. He underwent cardiac cath 11/25 demonstrating: Final Conclusions:  1. Severe stenoses of the LAD and first diagonal branches, treated successfully with drug-eluting stents 2. Diffuse nonobstructive stenoses of the left circumflex and right coronary artery  3. Mild segmental left ventricular dysfunction  with overall preserved LVEF  Dr. Burt Knack recommended aggressive medical therapy including dual antiplatelet therapy with aspirin and brilinta for 12 months. The patient had mild wrist oozing post cath but this resolved with pressure and is without complication this morning. The patient also reported symptoms supportive of a diagnosis of sleep apnea and we have recommended outpatient sleep study - the patient will be instructed to discuss with his PCP in [place]. His Cr at discharge is 1.53 and Hgb 10.1, normocytic (no signs/symptoms to suggest bleeding). This will be followed as an outpatient. We will obtain BMET/CBC at his follow-up appointment. Dr. Burt Knack has seen and examined him today and feels he is stable for discharge. His blood pressure is still modestly elevated and he may require continued titration of his antihypertensive drugs as an outpatient. I have informed our scheduling department that he will be a transition of care patient. He will also be given a 1 month free rx for Brilinta at discharge along with his regular prescription. If Brilinta turns out to be too expensive for him, it would be reasonable to switch to Plavix after 30 days - this can be addressed at his follow-up appointment.   The patient informed Dr. Burt Knack that he needs knee surgery because of end-stage osteoarthritis. Unfortunately he will have to wait another 12 months. He understands the need to focus on significant weight loss as this will also help in the recovery from the surgery. Also of note, prednisone was listed on the patient's medicine list. The patient was not sure if he was taking this or what it was for. It was not ordered as an inpatient.  Review of the chart indicates he was prescribed 6 tablets with 0 refills early November for an allergic reaction after being seen in the ED. This will be discontinued off the patient's medication list.  Discharge Vitals: Blood pressure 158/80, pulse 79, temperature 98 F (36.7  C), temperature source Oral, resp. rate 18, height 6' 3"  (1.905 m), weight 289 lb 7.4 oz (131.3 kg), SpO2 100.00%.  Labs: Lab Results  Component Value Date   WBC 7.6 11/17/2012   HGB 10.1* 11/17/2012   HCT 30.5* 11/17/2012   MCV 95.0 11/17/2012   PLT 219 11/17/2012     Lab 11/17/12 0510  NA 137  K 3.6  CL 102  CO2 23  BUN 24*  CREATININE 1.53*  CALCIUM 9.1  PROT --  BILITOT --  ALKPHOS --  ALT --  AST --  GLUCOSE 111*    Basename 11/15/12 1135 11/15/12 0625 11/15/12 0015 11/14/12 1929  CKTOTAL -- -- -- --  CKMB -- -- -- --  TROPONINI 9.02* 9.86* 4.68* <0.30   Lab Results  Component Value Date   CHOL 121 11/15/2012   HDL 36* 11/15/2012   LDLCALC 74 11/15/2012   TRIG 57 11/15/2012   No results found for this basename: DDIMER    Diagnostic Studies/Procedures    1. Cardiac catheterization this admission, please see full report and above for summary.  2. Dg Chest Portable 1 View 11/14/2012  *RADIOLOGY REPORT*  Clinical Data: 66 year old male chest pain.  PORTABLE CHEST - 1 VIEW  Comparison: 02/15/2009.  Findings: Portable upright AP view 1949 hours.  Cardiomegaly not significantly changed. Other mediastinal contours are within normal limits.  Visualized tracheal air column is within normal limits. No pneumothorax, pleural effusion or consolidation.  Mild basilar crowding of lung markings and/or vascular congestion without overt edema.  IMPRESSION: Chronic cardiomegaly. Mild lung base atelectasis or vascular congestion.  No overt edema.   Original Report Authenticated By: Roselyn Reef, M.D.     Discharge Medications   Current Discharge Medication List    START taking these medications   Details  metoprolol (LOPRESSOR) 50 MG tablet Take 1 tablet (50 mg total) by mouth 2 (two) times daily. Qty: 60 tablet, Refills: 6    nitroGLYCERIN (NITROSTAT) 0.4 MG SL tablet Place 1 tablet (0.4 mg total) under the tongue every 5 (five) minutes as needed for chest pain (up to 3  doses). WARNING: Do not take this medicine if you have taken Levitra within the past 24 hours. Qty: 25 tablet, Refills: 4    Ticagrelor (BRILINTA) 90 MG TABS tablet Take 1 tablet (90 mg total) by mouth 2 (two) times daily. Qty: 60 tablet, Refills: 10      CONTINUE these medications which have CHANGED   Details  vardenafil (LEVITRA) 20 MG tablet Take 1 tablet (20 mg total) by mouth daily as needed for erectile dysfunction. WARNING: Do not take this medicine if you have taken Nitroglycerin within the past 24 hours.      CONTINUE these medications which have NOT CHANGED   Details  ALPRAZolam (XANAX) 1 MG tablet Take 0.5 mg by mouth 3 (three) times daily as needed. For anxiety    aspirin 81 MG chewable tablet Chew 81 mg by mouth daily.    captopril (CAPOTEN) 50 MG tablet Take 50 mg by mouth 3 (three) times daily.    diphenhydrAMINE (BENADRYL) 25 mg capsule Take 1 capsule (25 mg total) by mouth every 6 (six) hours as needed for itching. Qty: 30  capsule, Refills: 0    furosemide (LASIX) 40 MG tablet Take 40 mg by mouth daily.    HYDROcodone-acetaminophen (NORCO/VICODIN) 5-325 MG per tablet Take 1 tablet by mouth every 6 (six) hours as needed. For pain    meloxicam (MOBIC) 7.5 MG tablet Take 15 mg by mouth daily.    pravastatin (PRAVACHOL) 20 MG tablet Take 20 mg by mouth daily.      STOP taking these medications     indomethacin (INDOCIN) 50 MG capsule Comments:  Reason for Stopping:       predniSONE (DELTASONE) 10 MG tablet Comments:  Reason for Stopping:       verapamil (CALAN) 80 MG tablet Comments:  Reason for Stopping:           Disposition   The patient will be discharged in stable condition to home. Discharge Orders    Future Appointments: Provider: Department: Dept Phone: Center:   11/24/2012 1:40 PM Lendon Colonel, NP [facility] [phone] [id number]     Future Orders Please Complete By Expires   Amb Referral to Cardiac  Rehabilitation      Comments:   Pt agrees to [place]. CRP in [place], will send referral.   Diet - low sodium heart healthy      Increase activity slowly      Comments:   No driving for 1 week. No lifting over 5 lbs for 1 week, then no lifting over 10 lbs until 1 week after that. No sexual activity for 2 weeks. Keep procedure site clean & dry. If you notice increased pain, swelling, bleeding or pus, call/return!  You may shower, but no soaking baths/hot tubs/pools for 1 week.   Discharge instructions      Comments:   If Brilinta turns out to be too expensive for you, talk to your cardiology provider at your follow-up appointment. You may be able to be switched to a cheaper medication after 30 days. (However, do not stop taking Brilinta without talking to your doctor! It is important not to miss any doses.)  Talk to your doctor about alternatives to indomethacin - this medicine may not be ideal in patients with kidney problems.     Follow-up Information    Follow up with [name], MD. (You had symptoms to suggest you might have sleep apnea (like daytime sleepiness) - please discuss arranging a sleep study with your primary care doctor.)    Contact information:   [address] [place] [address] [phone]       Follow up with Jory Sims, NP. (11/24/12 at 1:40pm. You will also have labwork that day (CBC and BMET))    Contact information:   [address] [place] [address] [phone]            Duration of Discharge Encounter: Greater than 30 minutes including physician and PA time.  Signed, Melina Copa PA-C 11/17/2012, 9:48 AM

## 2012-11-17 NOTE — Progress Notes (Signed)
Patient: [name] / Admit Date: 11/14/2012 / Date of Encounter: 11/17/2012, 8:02 AM   Subjective  No CP or SOB. Feels well. Per nursing, wrist oozed earlier this AM but stopped with last dressing change. Denies blood in stool or urine.   Objective   Telemetry: NSR 1st degree AVB occ PVC Physical Exam: Filed Vitals:   11/17/12 0504  BP: 158/80  Pulse: 79  Temp: 98 F (36.7 C)  Resp: 18   General: Well developed, well nourished obese AAM in no acute distress. Head: Normocephalic, atraumatic, sclera non-icteric, no xanthomas, nares are without discharge. Neck: Negative for carotid bruits. JVD not elevated. No lymphadenopathy. Lungs: Clear bilaterally to auscultation without wheezes, rales, or rhonchi. Breathing is unlabored. Heart: RRR S1 S2 , soft SEM (LLSB) without rubs or gallops.  Abdomen: Soft, non-tender, non-distended with normoactive bowel sounds. No hepatomegaly. No rebound/guarding. Msk:  Strength and tone appear normal for age. Extremities: No clubbing or cyanosis. Tr-1+ LE edema.  Distal pedal pulses are 2+ and equal bilaterally. R wrist puncture site clear without oozing or suppuration; slight area of puffiness proximal to the cath site but nontender and nonerythematous; no ecchymosis Neuro: Alert and oriented X 3. Moves all extremities spontaneously. Psych:  Responds to questions appropriately with a normal affect.    Intake/Output Summary (Last 24 hours) at 11/17/12 0802 Last data filed at 11/17/12 0507  Gross per 24 hour  Intake 1864.45 ml  Output   1425 ml  Net 439.45 ml    Inpatient Medications:    . [COMPLETED] aspirin      . aspirin EC  81 mg Oral Daily  . [COMPLETED] bivalirudin      . captopril  50 mg Oral TID  . [COMPLETED] diazepam  5 mg Oral On Call  . [COMPLETED] fentaNYL      . furosemide  40 mg Oral Daily  . [COMPLETED] heparin      . [COMPLETED] heparin      . [COMPLETED] lidocaine      . meloxicam  15 mg Oral Daily  . metoprolol  tartrate  50 mg Oral BID  . [COMPLETED] midazolam      . [COMPLETED] midazolam      . [COMPLETED] midazolam      . [COMPLETED] nitroGLYCERIN      . simvastatin  20 mg Oral q1800  . [COMPLETED] Ticagrelor      . Ticagrelor  90 mg Oral BID  . [COMPLETED] verapamil      . [DISCONTINUED] enoxaparin (LOVENOX) injection  140 mg Subcutaneous Q12H  . [DISCONTINUED] sodium chloride  3 mL Intravenous Q12H    Labs:  [facility] 11/17/12 0510 11/15/12 0015 11/14/12 1929  NA 137 -- 138  K 3.6 -- 3.9  CL 102 -- 101  CO2 23 -- 24  GLUCOSE 111* -- 154*  BUN 24* -- 24*  CREATININE 1.53* 1.32 --  CALCIUM 9.1 -- 9.4  MG -- -- --  PHOS -- -- --    Basename 11/17/12 0510 11/16/12 0500  WBC 7.6 6.4  NEUTROABS -- --  HGB 10.1* 10.0*  HCT 30.5* 30.0*  MCV 95.0 94.9  PLT 219 214    Basename 11/15/12 1135 11/15/12 0625 11/15/12 0015 11/14/12 1929  CKTOTAL -- -- -- --  CKMB -- -- -- --  TROPONINI 9.02* 9.86* 4.68* <0.30    Basename 11/15/12 0015  HGBA1C 6.0*    Basename 11/15/12 0625  CHOL 121  HDL 36*  LDLCALC 74  TRIG  31  CHOLHDL 3.4    Radiology/Studies:  Dg Chest Portable 1 View 11/14/2012  *RADIOLOGY REPORT*  Clinical Data: 66 year old male chest pain.  PORTABLE CHEST - 1 VIEW  Comparison: 02/15/2009.  Findings: Portable upright AP view 1949 hours.  Cardiomegaly not significantly changed. Other mediastinal contours are within normal limits.  Visualized tracheal air column is within normal limits. No pneumothorax, pleural effusion or consolidation.  Mild basilar crowding of lung markings and/or vascular congestion without overt edema.  IMPRESSION: Chronic cardiomegaly. Mild lung base atelectasis or vascular congestion.  No overt edema.   Original Report Authenticated By: Roselyn Reef, M.D.      Assessment and Plan   1. CAD (newly diagnosed) with NSTEMI this admission s/p DES to LAD, DES to 1st diagonal with residual diffuse nonobstructive dz in LCx/RCA - continue asa, Brilinta,  statin, BB - nursing reports prior wrist ooze - this has stopped but will ask MD to also take a look 2. CKD stage III - Cr slightly up from admit (variable in chart 1.2-1.9). Would probably avoid indomethacin at discharge. 3. HTN - verapamil changed to lopressor this admission, on captopril TID as at home, Lasix. BP slightly up this AM pre-med. Consider titration of BB as outpt if BP still high. (has 1st degree AVB but HR stable) 4. Suspected sleep apnea - will need OP sleep study. 5. Normocytic anemia - stable today. ?of chronic disease r/t renal function - will need to trend as OP. No evidence of bleeding. 6. Arthritis - takes prednisone chronically at home, will reorder so he gets his dose this AM. 7. Chronic diastolic CHF  Ambulate with cardiac rehab this AM. Possible DC.  Signed, Melina Copa PA-C  Patient seen, examined. Available data reviewed. Agree with findings, assessment, and plan as outlined by Melina Copa, PA-C. The patient was independently interviewed and examined. I agree with the change of his calcium channel blocker to metoprolol. His blood pressure is still modestly elevated and he may require continued titration of his antihypertensive drugs as an outpatient. He should remain on dual antiplatelet therapy with aspirin and brilinta for one year. If brilinta turns out to be too expensive for him, it would be reasonable to switch to Plavix after 30 days. He tells me that he needs knee surgery because of end-stage osteoarthritis. Unfortunately he will have to wait another 12 months. He understands the need to focus on significant weight loss as this will also help in the recovery from the surgery.  Sherren Mocha, M.D. 11/17/2012 9:17 AM

## 2012-11-24 ENCOUNTER — Encounter: Payer: Self-pay | Admitting: Adult Health

## 2012-11-24 ENCOUNTER — Ambulatory Visit (INDEPENDENT_AMBULATORY_CARE_PROVIDER_SITE_OTHER): Payer: Medicare Other | Admitting: Adult Health

## 2012-11-24 VITALS — BP 94/60 | HR 91 | Ht 75.0 in | Wt 288.0 lb

## 2012-11-24 DIAGNOSIS — Z9861 Coronary angioplasty status: Secondary | ICD-10-CM

## 2012-11-24 DIAGNOSIS — I10 Essential (primary) hypertension: Secondary | ICD-10-CM

## 2012-11-24 DIAGNOSIS — N183 Chronic kidney disease, stage 3 unspecified: Secondary | ICD-10-CM

## 2012-11-24 DIAGNOSIS — I5032 Chronic diastolic (congestive) heart failure: Secondary | ICD-10-CM

## 2012-11-24 DIAGNOSIS — I2510 Atherosclerotic heart disease of native coronary artery without angina pectoris: Secondary | ICD-10-CM | POA: Insufficient documentation

## 2012-11-24 NOTE — Assessment & Plan Note (Signed)
The patient is without complaint of chest pain. He is tolerating his medications with the exception of the [place] for which he complains of bleeding. I have advised him that we can change him to Plavix, however he wishes to continue on the Brilinta as he is able to get samples of this to save him money. I will have a CBC, and stool Hemoccult cards provided to him. He has a history of normocytic anemia, but with his complaints of bleeding issues, I would like to evaluate his status. A BMET is also ordered to evaluate kidney function. I discharge creatinine was 1.53 , hemoglobin was 10.1. I will see him again in one month. He is referred to cardiac rehabilitation for continued strengthening and education.

## 2012-11-24 NOTE — Patient Instructions (Addendum)
Your physician recommends that you schedule a follow-up appointment in: [place]  Your physician recommends that you return for lab work in: TODAY (BMET, CBC)  PLEASE BRING STOOL CARDS BACK TO OFFICE ONCE COMPLETED  YOU WILL RECEIVE A CALL ABOUT CARDIAC REHAB

## 2012-11-24 NOTE — Assessment & Plan Note (Signed)
He is suffering from gout symptoms in his knees and is requesting medication for relief. Primary has had them on allopurinol. He is due to see his primary care physician in 3 days. I have advised him to make sure he keeps that appointment for adjustment in medications or changes to help he is pain and symptoms. BMET is ordered.

## 2012-11-24 NOTE — Progress Notes (Deleted)
Name: [name]    DOB: 1946-03-14  Age: 66 y.o.  MR#: [id number]       PCP:  [name], MD      Insurance: @PAYORNAME @   CC:   No chief complaint on file.   VS BP 94/60  Pulse 91  Ht 6' 3"  (1.905 m)  Wt 288 lb (130.636 kg)  BMI 36.00 kg/m2  SpO2 91%  Weights Current Weight  11/24/12 288 lb (130.636 kg)  11/17/12 289 lb 7.4 oz (131.3 kg)  11/17/12 289 lb 7.4 oz (131.3 kg)    Blood Pressure  BP Readings from Last 3 Encounters:  11/24/12 94/60  11/17/12 158/80  11/17/12 158/80     Admit date:  (Not on file) Last encounter with RMR:  Visit date not found   Allergy Allergies  Allergen Reactions  . Niacin And Related Other (See Comments)    Reaction: flushing and burning side effects even with extended release    Current Outpatient Prescriptions  Medication Sig Dispense Refill  . allopurinol (ZYLOPRIM) 300 MG tablet Take 300 mg by mouth daily.      Marland Kitchen ALPRAZolam (XANAX) 1 MG tablet Take 0.5 mg by mouth 3 (three) times daily as needed. For anxiety      . aspirin 81 MG chewable tablet Chew 81 mg by mouth daily.      . captopril (CAPOTEN) 50 MG tablet Take 50 mg by mouth 3 (three) times daily.      . furosemide (LASIX) 40 MG tablet Take 40 mg by mouth 3 (three) times daily.       . metoprolol (LOPRESSOR) 50 MG tablet Take 1 tablet (50 mg total) by mouth 2 (two) times daily.  60 tablet  6  . nitroGLYCERIN (NITROSTAT) 0.4 MG SL tablet Place 1 tablet (0.4 mg total) under the tongue every 5 (five) minutes as needed for chest pain (up to 3 doses). WARNING: Do not take this medicine if you have taken Levitra within the past 24 hours.  25 tablet  4  . pravastatin (PRAVACHOL) 20 MG tablet Take 20 mg by mouth daily.      . Ticagrelor (BRILINTA) 90 MG TABS tablet Take 1 tablet (90 mg total) by mouth 2 (two) times daily.  60 tablet  10  . verapamil (CALAN) 80 MG tablet Take 80 mg by mouth.         Discontinued Meds:    Medications Discontinued During This Encounter   Medication Reason  . diphenhydrAMINE (BENADRYL) 25 mg capsule Error  . HYDROcodone-acetaminophen (NORCO/VICODIN) 5-325 MG per tablet Error  . meloxicam (MOBIC) 7.5 MG tablet Error  . vardenafil (LEVITRA) 20 MG tablet Error    Patient Active Problem List  Diagnosis  . Hypertension  . Chronic kidney disease, stage 3, mod decreased GFR  . NSTEMI (non-ST elevated myocardial infarction)  . Obstructive sleep apnea  . Obesity    LABS Admission on 11/14/2012, Discharged on 11/17/2012  Component Date Value  . WBC 11/14/2012 8.6   . RBC 11/14/2012 3.52*  . Hemoglobin 11/14/2012 11.1*  . HCT 11/14/2012 33.8*  . MCV 11/14/2012 96.0   . [facility] 11/14/2012 31.5   . MCHC 11/14/2012 32.8   . RDW 11/14/2012 15.4   . Platelets 11/14/2012 218   . Troponin I 11/14/2012 <0.30   . Sodium 11/14/2012 138   . Potassium 11/14/2012 3.9   . Chloride 11/14/2012 101   . CO2 11/14/2012 24   . Glucose, Bld 11/14/2012 154*  .  BUN 11/14/2012 24*  . Creatinine, Ser 11/14/2012 1.39*  . Calcium 11/14/2012 9.4   . GFR calc non Af Amer 11/14/2012 51*  . GFR calc Af Amer 11/14/2012 59*  . Troponin I 11/15/2012 4.68*  . Troponin I 11/15/2012 9.86*  . Troponin I 11/15/2012 9.02*  . TSH 11/15/2012 1.054   . Hemoglobin A1C 11/15/2012 6.0*  . Mean Plasma Glucose 11/15/2012 126*  . Pro B Natriuretic peptid* 11/15/2012 86.0   . Cholesterol 11/15/2012 121   . Triglycerides 11/15/2012 57   . HDL 11/15/2012 36*  . Total CHOL/HDL Ratio 11/15/2012 3.4   . VLDL 11/15/2012 11   . LDL Cholesterol 11/15/2012 74   . WBC 11/15/2012 7.2   . RBC 11/15/2012 3.14*  . Hemoglobin 11/15/2012 9.8*  . HCT 11/15/2012 30.1*  . MCV 11/15/2012 95.9   . [facility] 11/15/2012 31.2   . MCHC 11/15/2012 32.6   . RDW 11/15/2012 15.2   . Platelets 11/15/2012 200   . Creatinine, Ser 11/15/2012 1.32   . GFR calc non Af Amer 11/15/2012 54*  . GFR calc Af Amer 11/15/2012 63*  . WBC 11/16/2012 6.4   . RBC 11/16/2012 3.16*  . Hemoglobin  11/16/2012 10.0*  . HCT 11/16/2012 30.0*  . MCV 11/16/2012 94.9   . [facility] 11/16/2012 31.6   . MCHC 11/16/2012 33.3   . RDW 11/16/2012 15.2   . Platelets 11/16/2012 214   . Prothrombin Time 11/16/2012 13.1   . INR 11/16/2012 1.00   . WBC 11/17/2012 7.6   . RBC 11/17/2012 3.21*  . Hemoglobin 11/17/2012 10.1*  . HCT 11/17/2012 30.5*  . MCV 11/17/2012 95.0   . [facility] 11/17/2012 31.5   . MCHC 11/17/2012 33.1   . RDW 11/17/2012 15.2   . Platelets 11/17/2012 219   . Sodium 11/17/2012 137   . Potassium 11/17/2012 3.6   . Chloride 11/17/2012 102   . CO2 11/17/2012 23   . Glucose, Bld 11/17/2012 111*  . BUN 11/17/2012 24*  . Creatinine, Ser 11/17/2012 1.53*  . Calcium 11/17/2012 9.1   . GFR calc non Af Amer 11/17/2012 45*  . GFR calc Af Amer 11/17/2012 53*  . Activated Clotting Time 11/16/2012 389      Results for this Opt Visit:     Results for orders placed during the hospital encounter of 11/14/12  CBC      Component Value Range   WBC 8.6  4.0 - 10.5 K/uL   RBC 3.52 (*) 4.22 - 5.81 MIL/uL   Hemoglobin 11.1 (*) 13.0 - 17.0 g/dL   HCT 33.8 (*) 39.0 - 52.0 %   MCV 96.0  78.0 - 100.0 fL   MCH 31.5  26.0 - 34.0 pg   MCHC 32.8  30.0 - 36.0 g/dL   RDW 15.4  11.5 - 15.5 %   Platelets 218  150 - 400 K/uL  TROPONIN I      Component Value Range   Troponin I <0.30  <0.30 ng/mL  BASIC METABOLIC PANEL      Component Value Range   Sodium 138  135 - 145 mEq/L   Potassium 3.9  3.5 - 5.1 mEq/L   Chloride 101  96 - 112 mEq/L   CO2 24  19 - 32 mEq/L   Glucose, Bld 154 (*) 70 - 99 mg/dL   BUN 24 (*) 6 - 23 mg/dL   Creatinine, Ser 1.39 (*) 0.50 - 1.35 mg/dL   Calcium 9.4  8.4 - 10.5 mg/dL  GFR calc non Af Amer 51 (*) >90 mL/min   GFR calc Af Amer 59 (*) >90 mL/min  TROPONIN I      Component Value Range   Troponin I 4.68 (*) <0.30 ng/mL  TROPONIN I      Component Value Range   Troponin I 9.86 (*) <0.30 ng/mL  TROPONIN I      Component Value Range   Troponin I 9.02 (*) <0.30 ng/mL   TSH      Component Value Range   TSH 1.054  0.350 - 4.500 uIU/mL  HEMOGLOBIN A1C      Component Value Range   Hemoglobin A1C 6.0 (*) <5.7 %   Mean Plasma Glucose 126 (*) <117 mg/dL  PRO B NATRIURETIC PEPTIDE      Component Value Range   Pro B Natriuretic peptide (BNP) 86.0  0 - 125 pg/mL  LIPID PANEL      Component Value Range   Cholesterol 121  0 - 200 mg/dL   Triglycerides 57  <150 mg/dL   HDL 36 (*) >39 mg/dL   Total CHOL/HDL Ratio 3.4     VLDL 11  0 - 40 mg/dL   LDL Cholesterol 74  0 - 99 mg/dL  CBC      Component Value Range   WBC 7.2  4.0 - 10.5 K/uL   RBC 3.14 (*) 4.22 - 5.81 MIL/uL   Hemoglobin 9.8 (*) 13.0 - 17.0 g/dL   HCT 30.1 (*) 39.0 - 52.0 %   MCV 95.9  78.0 - 100.0 fL   MCH 31.2  26.0 - 34.0 pg   MCHC 32.6  30.0 - 36.0 g/dL   RDW 15.2  11.5 - 15.5 %   Platelets 200  150 - 400 K/uL  CREATININE, SERUM      Component Value Range   Creatinine, Ser 1.32  0.50 - 1.35 mg/dL   GFR calc non Af Amer 54 (*) >90 mL/min   GFR calc Af Amer 63 (*) >90 mL/min  CBC      Component Value Range   WBC 6.4  4.0 - 10.5 K/uL   RBC 3.16 (*) 4.22 - 5.81 MIL/uL   Hemoglobin 10.0 (*) 13.0 - 17.0 g/dL   HCT 30.0 (*) 39.0 - 52.0 %   MCV 94.9  78.0 - 100.0 fL   MCH 31.6  26.0 - 34.0 pg   MCHC 33.3  30.0 - 36.0 g/dL   RDW 15.2  11.5 - 15.5 %   Platelets 214  150 - 400 K/uL  PROTIME-INR      Component Value Range   Prothrombin Time 13.1  11.6 - 15.2 seconds   INR 1.00  0.00 - 1.49  CBC      Component Value Range   WBC 7.6  4.0 - 10.5 K/uL   RBC 3.21 (*) 4.22 - 5.81 MIL/uL   Hemoglobin 10.1 (*) 13.0 - 17.0 g/dL   HCT 30.5 (*) 39.0 - 52.0 %   MCV 95.0  78.0 - 100.0 fL   MCH 31.5  26.0 - 34.0 pg   MCHC 33.1  30.0 - 36.0 g/dL   RDW 15.2  11.5 - 15.5 %   Platelets 219  150 - 400 K/uL  BASIC METABOLIC PANEL      Component Value Range   Sodium 137  135 - 145 mEq/L   Potassium 3.6  3.5 - 5.1 mEq/L   Chloride 102  96 - 112 mEq/L   CO2 23  19 -  32 mEq/L   Glucose, Bld 111 (*)  70 - 99 mg/dL   BUN 24 (*) 6 - 23 mg/dL   Creatinine, Ser 1.53 (*) 0.50 - 1.35 mg/dL   Calcium 9.1  8.4 - 10.5 mg/dL   GFR calc non Af Amer 45 (*) >90 mL/min   GFR calc Af Amer 53 (*) >90 mL/min  POCT ACTIVATED CLOTTING TIME      Component Value Range   Activated Clotting Time 389      EKG Orders placed during the hospital encounter of 11/14/12  . EKG 12-LEAD  . EKG 12-LEAD  . ED EKG  . ED EKG  . ED EKG  . ED EKG  . EKG 12-LEAD  . EKG 12-LEAD  . EKG 12-LEAD  . EKG 12-LEAD  . EKG 12-LEAD  . EKG 12-LEAD  . EKG 12-LEAD  . EKG 12-LEAD  . EKG 12-LEAD  . EKG 12-LEAD  . EKG 12-LEAD  . EKG  . EKG 12-LEAD  . EKG 12-LEAD  . EKG 12-LEAD  . EKG 12-LEAD  . EKG 12-LEAD  . EKG 12-LEAD  . EKG 12-LEAD  . EKG 12-LEAD     Prior Assessment and Plan Problem List as of 11/24/2012          Hypertension   Last Assessment & Plan Note   08/23/2011 Office Visit Signed 08/23/2011  3:34 PM by Yehuda Savannah, MD    Blood pressure control is good, and current medications will be continued.    Chronic kidney disease, stage 3, mod decreased GFR   NSTEMI (non-ST elevated myocardial infarction)   Obstructive sleep apnea   Obesity       Imaging: Dg Chest Portable 1 View  11/14/2012  *RADIOLOGY REPORT*  Clinical Data: 66 year old male chest pain.  PORTABLE CHEST - 1 VIEW  Comparison: 02/15/2009.  Findings: Portable upright AP view 1949 hours.  Cardiomegaly not significantly changed. Other mediastinal contours are within normal limits.  Visualized tracheal air column is within normal limits. No pneumothorax, pleural effusion or consolidation.  Mild basilar crowding of lung markings and/or vascular congestion without overt edema.  IMPRESSION: Chronic cardiomegaly. Mild lung base atelectasis or vascular congestion.  No overt edema.   Original Report Authenticated By: Roselyn Reef, M.D.      [facility] Calculation: Score not calculated. Missing: Total Cholesterol

## 2012-11-24 NOTE — Assessment & Plan Note (Signed)
Pressure is currently soft on this evaluation and 94/60. He continues on Lasix 40 mg 3 times a day. Along with metoprolol 50 mg twice a day. We will review labs and may decrease the Lasix to twice a day. He also continues on captopril 50 mg 3 times a day as well.  Medication adjustments may need to be completed on followup once labs are evaluated. Do not want him to have issues with hypotension with a normal LV function.

## 2012-11-24 NOTE — Progress Notes (Signed)
HPI: [name] is a 66 year old male patient of Dr. Jacqulyn Ducking worsening post hospitalization with newly diagnosed CAD, non-ST elevation MI, with cardiac catheterization completed on 11/14/2012. He has subsequent drug-eluting stent to the LAD and drug-eluting stent to the first diagonal with residual diffuse nonobstructive disease in the left circumflex and right coronary artery. His LV function was found to be normal. The patient also has a history of hypertension, chronic kidney disease stage III, normocytic anemia, chronic diastolic CHF, and is recently been having an exacerbation of gout. The patient is tolerating his medications with the exception of bleeding issues concerning use of Brilinta. He states that it takes about 10 minutes to have any bleeding subside after a cut or scrape. The patient has no further complaints of chest discomfort or dyspnea on exertion prior to this visit. His main complaint is knee pain requiring him to use a walker for ambulation. He wishes to continue on the Prilosec as long as he can receive samples.  Allergies  Allergen Reactions  . Niacin And Related Other (See Comments)    Reaction: flushing and burning side effects even with extended release    Current Outpatient Prescriptions  Medication Sig Dispense Refill  . allopurinol (ZYLOPRIM) 300 MG tablet Take 300 mg by mouth daily.      Marland Kitchen ALPRAZolam (XANAX) 1 MG tablet Take 0.5 mg by mouth 3 (three) times daily as needed. For anxiety      . aspirin 81 MG chewable tablet Chew 81 mg by mouth daily.      . captopril (CAPOTEN) 50 MG tablet Take 50 mg by mouth 3 (three) times daily.      . furosemide (LASIX) 40 MG tablet Take 40 mg by mouth 3 (three) times daily.       . metoprolol (LOPRESSOR) 50 MG tablet Take 1 tablet (50 mg total) by mouth 2 (two) times daily.  60 tablet  6  . nitroGLYCERIN (NITROSTAT) 0.4 MG SL tablet Place 1 tablet (0.4 mg total) under the tongue every 5 (five) minutes as needed for chest  pain (up to 3 doses). WARNING: Do not take this medicine if you have taken Levitra within the past 24 hours.  25 tablet  4  . pravastatin (PRAVACHOL) 20 MG tablet Take 20 mg by mouth daily.      . Ticagrelor (BRILINTA) 90 MG TABS tablet Take 1 tablet (90 mg total) by mouth 2 (two) times daily.  60 tablet  10  . verapamil (CALAN) 80 MG tablet Take 80 mg by mouth.         Past Medical History  Diagnosis Date  . Diastolic heart failure     Pedal edema  . Hypertension     Exercise induced  . NSVT (nonsustained ventricular tachycardia)     Exercise induced  . 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 Obesity   . Degenerative joint disease   . Benign prostatic hypertrophy     Nocturia  . Erectile dysfunction   . Chronic kidney disease, stage 3, mod decreased GFR     Creatinine of 1.5 in 03/2009  . High cholesterol   . Obstructive sleep apnea     Probable sleep study to be done  . CAD (coronary artery disease)     a.  NSTEMI 10/2012 s/p DES to LAD & DES to 1st diagonal with residual diffuse nonobstructive dz in LCx/RCA     Past Surgical History  Procedure Date  . Back surgery     IZX:YOFVWA of systems complete and found to be negative unless listed above  PHYSICAL EXAM BP 94/60  Pulse 91  Ht 6' 3"  (1.905 m)  Wt 288 lb (130.636 kg)  BMI 36.00 kg/m2  SpO2 91%  General: Well developed, well nourished,  Complaining of knee pain bilaterally. Head: Eyes PERRLA, No xanthomas.   Normal cephalic and atramatic  Lungs: Clear bilaterally to auscultation and percussion. Heart: HRRR S1 S2, distant heart sounds, without MRG.  Pulses are 2+ & equal. Diminished in  The lower extremities.            No carotid bruit. No JVD.  No abdominal bruits. No femoral bruits. Abdomen: Bowel sounds are positive, abdomen soft and non-tender without masses or                  Hernia's noted. Msk:  Back normal, slow stiff gait, using walker for ambulation. Diminished strength and tone  for age. Extremities: No clubbing, cyanosis or edema.  DP +1 Neuro: Alert and oriented X 3. Psych:  Good affect, responds appropriately  EKG:NSR with 1st degree AV block.Inferior T-wave inversion with T-wave flattening in V6.  ASSESSMENT AND PLAN

## 2012-11-25 LAB — BASIC METABOLIC PANEL
Calcium: 9.8 mg/dL (ref 8.4–10.5)
Glucose, Bld: 137 mg/dL — ABNORMAL HIGH (ref 70–99)
Potassium: 4.7 mEq/L (ref 3.5–5.3)
Sodium: 136 mEq/L (ref 135–145)

## 2012-11-25 LAB — CBC
HCT: 33.8 % — ABNORMAL LOW (ref 39.0–52.0)
MCV: 94.7 fL (ref 78.0–100.0)
RBC: 3.57 MIL/uL — ABNORMAL LOW (ref 4.22–5.81)
RDW: 16.1 % — ABNORMAL HIGH (ref 11.5–15.5)
WBC: 8.7 10*3/uL (ref 4.0–10.5)

## 2012-11-26 ENCOUNTER — Encounter: Payer: Self-pay | Admitting: *Deleted

## 2012-11-26 MED ORDER — AMLODIPINE BESYLATE 10 MG PO TABS: 10.0000 mg | ORAL_TABLET | Freq: Every day | ORAL | Status: DC

## 2012-11-26 NOTE — Addendum Note (Signed)
Addended by: Shara Blazing A on: 11/26/2012 09:55 AM   Modules accepted: Orders

## 2012-12-08 ENCOUNTER — Telehealth: Payer: Self-pay | Admitting: Cardiology

## 2012-12-08 NOTE — Telephone Encounter (Signed)
Patient would like to discuss side effects of Brillinta. / tg

## 2012-12-08 NOTE — Telephone Encounter (Signed)
Patient states that he saw PCP regarding a viral infection and has developed constant hiccups, which PCP feels is coming from [place].  When reviewing insert, I did not see mention of this as being a side effect.  Please advise further.

## 2012-12-09 NOTE — Telephone Encounter (Signed)
This is not an adverse effect of ticagrelor (Brilinta). There is a significant risk of myocardial infarction and death if this medication is discontinued.

## 2012-12-09 NOTE — Telephone Encounter (Signed)
Patient advised of notation below and verbalizes understanding.

## 2012-12-09 NOTE — Telephone Encounter (Signed)
Attempted to contact patient. No answer at this time.

## 2012-12-10 ENCOUNTER — Inpatient Hospital Stay (HOSPITAL_COMMUNITY)
Admission: EM | Admit: 2012-12-10 | Discharge: 2012-12-13 | DRG: 391 | Disposition: A | Discharge status: Home / Self Care | Payer: Medicare Other | Attending: Internal Medicine | Admitting: Internal Medicine

## 2012-12-10 ENCOUNTER — Encounter (HOSPITAL_COMMUNITY): Payer: Self-pay | Admitting: *Deleted

## 2012-12-10 DIAGNOSIS — R112 Nausea with vomiting, unspecified: Secondary | ICD-10-CM

## 2012-12-10 DIAGNOSIS — I4729 Other ventricular tachycardia: Secondary | ICD-10-CM | POA: Diagnosis present

## 2012-12-10 DIAGNOSIS — E43 Unspecified severe protein-calorie malnutrition: Secondary | ICD-10-CM | POA: Diagnosis present

## 2012-12-10 DIAGNOSIS — G4733 Obstructive sleep apnea (adult) (pediatric): Secondary | ICD-10-CM

## 2012-12-10 DIAGNOSIS — E669 Obesity, unspecified: Secondary | ICD-10-CM

## 2012-12-10 DIAGNOSIS — Z7982 Long term (current) use of aspirin: Secondary | ICD-10-CM

## 2012-12-10 DIAGNOSIS — E119 Type 2 diabetes mellitus without complications: Secondary | ICD-10-CM

## 2012-12-10 DIAGNOSIS — I129 Hypertensive chronic kidney disease with stage 1 through stage 4 chronic kidney disease, or unspecified chronic kidney disease: Secondary | ICD-10-CM | POA: Diagnosis present

## 2012-12-10 DIAGNOSIS — N189 Chronic kidney disease, unspecified: Secondary | ICD-10-CM

## 2012-12-10 DIAGNOSIS — Z9861 Coronary angioplasty status: Secondary | ICD-10-CM

## 2012-12-10 DIAGNOSIS — D649 Anemia, unspecified: Secondary | ICD-10-CM | POA: Diagnosis present

## 2012-12-10 DIAGNOSIS — I472 Ventricular tachycardia, unspecified: Secondary | ICD-10-CM | POA: Diagnosis present

## 2012-12-10 DIAGNOSIS — Z87891 Personal history of nicotine dependence: Secondary | ICD-10-CM

## 2012-12-10 DIAGNOSIS — E1169 Type 2 diabetes mellitus with other specified complication: Secondary | ICD-10-CM | POA: Diagnosis present

## 2012-12-10 DIAGNOSIS — K529 Noninfective gastroenteritis and colitis, unspecified: Secondary | ICD-10-CM

## 2012-12-10 DIAGNOSIS — I2510 Atherosclerotic heart disease of native coronary artery without angina pectoris: Secondary | ICD-10-CM

## 2012-12-10 DIAGNOSIS — Z79899 Other long term (current) drug therapy: Secondary | ICD-10-CM

## 2012-12-10 DIAGNOSIS — N179 Acute kidney failure, unspecified: Secondary | ICD-10-CM

## 2012-12-10 DIAGNOSIS — N183 Chronic kidney disease, stage 3 unspecified: Secondary | ICD-10-CM

## 2012-12-10 DIAGNOSIS — I214 Non-ST elevation (NSTEMI) myocardial infarction: Secondary | ICD-10-CM

## 2012-12-10 DIAGNOSIS — Z23 Encounter for immunization: Secondary | ICD-10-CM

## 2012-12-10 DIAGNOSIS — N400 Enlarged prostate without lower urinary tract symptoms: Secondary | ICD-10-CM | POA: Diagnosis present

## 2012-12-10 DIAGNOSIS — Z6835 Body mass index (BMI) 35.0-35.9, adult: Secondary | ICD-10-CM

## 2012-12-10 DIAGNOSIS — E871 Hypo-osmolality and hyponatremia: Secondary | ICD-10-CM

## 2012-12-10 DIAGNOSIS — M1990 Unspecified osteoarthritis, unspecified site: Secondary | ICD-10-CM | POA: Diagnosis present

## 2012-12-10 DIAGNOSIS — E861 Hypovolemia: Secondary | ICD-10-CM | POA: Diagnosis present

## 2012-12-10 DIAGNOSIS — I10 Essential (primary) hypertension: Secondary | ICD-10-CM

## 2012-12-10 DIAGNOSIS — E860 Dehydration: Secondary | ICD-10-CM

## 2012-12-10 DIAGNOSIS — I252 Old myocardial infarction: Secondary | ICD-10-CM

## 2012-12-10 DIAGNOSIS — A088 Other specified intestinal infections: Principal | ICD-10-CM | POA: Diagnosis present

## 2012-12-10 HISTORY — DX: Type 2 diabetes mellitus without complications: E11.9

## 2012-12-10 LAB — CBC WITH DIFFERENTIAL/PLATELET
Eosinophils Absolute: 0.1 10*3/uL (ref 0.0–0.7)
Hemoglobin: 10.8 g/dL — ABNORMAL LOW (ref 13.0–17.0)
Lymphocytes Relative: 5 % — ABNORMAL LOW (ref 12–46)
Lymphs Abs: 0.7 10*3/uL (ref 0.7–4.0)
MCH: 30.7 pg (ref 26.0–34.0)
Monocytes Relative: 4 % (ref 3–12)
Neutro Abs: 12.4 10*3/uL — ABNORMAL HIGH (ref 1.7–7.7)
Neutrophils Relative %: 90 % — ABNORMAL HIGH (ref 43–77)
RBC: 3.52 MIL/uL — ABNORMAL LOW (ref 4.22–5.81)
WBC: 13.8 10*3/uL — ABNORMAL HIGH (ref 4.0–10.5)

## 2012-12-10 LAB — BASIC METABOLIC PANEL
BUN: 99 mg/dL — ABNORMAL HIGH (ref 6–23)
CO2: 23 mEq/L (ref 19–32)
Chloride: 85 mEq/L — ABNORMAL LOW (ref 96–112)
Creatinine, Ser: 3.21 mg/dL — ABNORMAL HIGH (ref 0.50–1.35)
GFR calc Af Amer: 21 mL/min — ABNORMAL LOW (ref >90)
Potassium: 4.1 mEq/L (ref 3.5–5.1)

## 2012-12-10 LAB — TROPONIN I: Troponin I: <0.3 ng/mL (ref <0.30)

## 2012-12-10 LAB — GLUCOSE, CAPILLARY: Glucose-Capillary: 244 mg/dL — ABNORMAL HIGH (ref 70–99)

## 2012-12-10 MED ORDER — ONDANSETRON HCL 4 MG PO TABS
4.0000 mg | ORAL_TABLET | Freq: Four times a day (QID) | ORAL | Status: DC | PRN
  Administered 2012-12-11 – 2012-12-13 (×3): 4 mg via ORAL
  Filled 2012-12-10 (×3): qty 1

## 2012-12-10 MED ORDER — ACETAMINOPHEN 650 MG RE SUPP: 650.0000 mg | Freq: Four times a day (QID) | RECTAL | Status: DC | PRN

## 2012-12-10 MED ORDER — SODIUM CHLORIDE 0.9 % IV BOLUS (SEPSIS)
1000.0000 mL | Freq: Once | INTRAVENOUS | Status: AC
  Administered 2012-12-10: 1000 mL via INTRAVENOUS

## 2012-12-10 MED ORDER — INSULIN ASPART 100 UNIT/ML ~~LOC~~ SOLN
0.0000 [IU] | Freq: Every day | SUBCUTANEOUS | Status: DC
  Administered 2012-12-10: 2 [IU] via SUBCUTANEOUS
  Administered 2012-12-11: 4 [IU] via SUBCUTANEOUS

## 2012-12-10 MED ORDER — ALUM & MAG HYDROXIDE-SIMETH 200-200-20 MG/5ML PO SUSP
30.0000 mL | Freq: Once | ORAL | Status: AC
  Administered 2012-12-10: 30 mL via ORAL
  Filled 2012-12-10: qty 30

## 2012-12-10 MED ORDER — ALBUTEROL SULFATE (5 MG/ML) 0.5% IN NEBU: 2.5000 mg | INHALATION_SOLUTION | RESPIRATORY_TRACT | Status: DC | PRN

## 2012-12-10 MED ORDER — NITROGLYCERIN 0.4 MG SL SUBL: 0.4000 mg | SUBLINGUAL_TABLET | SUBLINGUAL | Status: DC | PRN

## 2012-12-10 MED ORDER — AMLODIPINE BESYLATE 5 MG PO TABS
10.0000 mg | ORAL_TABLET | Freq: Every day | ORAL | Status: DC
  Administered 2012-12-12: 10 mg via ORAL
  Filled 2012-12-10 (×2): qty 2

## 2012-12-10 MED ORDER — ONDANSETRON HCL 4 MG/2ML IJ SOLN
4.0000 mg | Freq: Four times a day (QID) | INTRAMUSCULAR | Status: DC | PRN
  Administered 2012-12-11 – 2012-12-12 (×4): 4 mg via INTRAVENOUS
  Filled 2012-12-10 (×4): qty 2

## 2012-12-10 MED ORDER — METOPROLOL TARTRATE 50 MG PO TABS
50.0000 mg | ORAL_TABLET | Freq: Two times a day (BID) | ORAL | Status: DC
  Administered 2012-12-11 – 2012-12-13 (×4): 50 mg via ORAL
  Filled 2012-12-10 (×6): qty 1

## 2012-12-10 MED ORDER — TICAGRELOR 90 MG PO TABS
90.0000 mg | ORAL_TABLET | Freq: Two times a day (BID) | ORAL | Status: DC
  Administered 2012-12-10 – 2012-12-13 (×6): 90 mg via ORAL
  Filled 2012-12-10 (×8): qty 1

## 2012-12-10 MED ORDER — SODIUM CHLORIDE 0.9 % IV SOLN
INTRAVENOUS | Status: AC
  Administered 2012-12-10 – 2012-12-11 (×2): via INTRAVENOUS

## 2012-12-10 MED ORDER — VERAPAMIL HCL 80 MG PO TABS
80.0000 mg | ORAL_TABLET | Freq: Four times a day (QID) | ORAL | Status: DC
  Administered 2012-12-11 – 2012-12-13 (×5): 80 mg via ORAL
  Filled 2012-12-10 (×7): qty 1

## 2012-12-10 MED ORDER — ONDANSETRON HCL 4 MG/2ML IJ SOLN
4.0000 mg | Freq: Once | INTRAMUSCULAR | Status: AC
  Administered 2012-12-10: 4 mg via INTRAVENOUS
  Filled 2012-12-10: qty 2

## 2012-12-10 MED ORDER — INSULIN ASPART 100 UNIT/ML ~~LOC~~ SOLN
0.0000 [IU] | Freq: Three times a day (TID) | SUBCUTANEOUS | Status: DC
  Administered 2012-12-11: 3 [IU] via SUBCUTANEOUS
  Administered 2012-12-11: 5 [IU] via SUBCUTANEOUS
  Administered 2012-12-11: 7 [IU] via SUBCUTANEOUS
  Administered 2012-12-12: 2 [IU] via SUBCUTANEOUS
  Administered 2012-12-12: 7 [IU] via SUBCUTANEOUS
  Administered 2012-12-12 – 2012-12-13 (×2): 3 [IU] via SUBCUTANEOUS
  Administered 2012-12-13: 2 [IU] via SUBCUTANEOUS

## 2012-12-10 MED ORDER — PANTOPRAZOLE SODIUM 40 MG IV SOLR
40.0000 mg | INTRAVENOUS | Status: DC
  Administered 2012-12-10 – 2012-12-11 (×2): 40 mg via INTRAVENOUS
  Filled 2012-12-10 (×2): qty 40

## 2012-12-10 MED ORDER — ALLOPURINOL 300 MG PO TABS
300.0000 mg | ORAL_TABLET | Freq: Every day | ORAL | Status: DC
  Administered 2012-12-11 – 2012-12-13 (×3): 300 mg via ORAL
  Filled 2012-12-10 (×3): qty 1

## 2012-12-10 MED ORDER — SIMVASTATIN 10 MG PO TABS
10.0000 mg | ORAL_TABLET | Freq: Every day | ORAL | Status: DC
  Administered 2012-12-10 – 2012-12-12 (×3): 10 mg via ORAL
  Filled 2012-12-10 (×3): qty 1

## 2012-12-10 MED ORDER — ACETAMINOPHEN 325 MG PO TABS
650.0000 mg | ORAL_TABLET | Freq: Four times a day (QID) | ORAL | Status: DC | PRN
  Administered 2012-12-11 – 2012-12-12 (×2): 650 mg via ORAL
  Filled 2012-12-10 (×2): qty 2

## 2012-12-10 MED ORDER — ALPRAZOLAM 0.5 MG PO TABS
0.5000 mg | ORAL_TABLET | Freq: Three times a day (TID) | ORAL | Status: DC | PRN
  Administered 2012-12-11 – 2012-12-13 (×4): 0.5 mg via ORAL
  Filled 2012-12-10 (×4): qty 1

## 2012-12-10 MED ORDER — ASPIRIN 81 MG PO CHEW
81.0000 mg | CHEWABLE_TABLET | Freq: Every day | ORAL | Status: DC
  Administered 2012-12-11 – 2012-12-13 (×3): 81 mg via ORAL
  Filled 2012-12-10 (×3): qty 1

## 2012-12-10 NOTE — ED Notes (Signed)
NVD for 5 days,  And cbg elevated. Sent by [facility] for evaluation.

## 2012-12-10 NOTE — ED Provider Notes (Signed)
History   This chart was scribed for [name] Croft, MD by Kathreen Cornfield, ED Scribe. The patient was seen in room APA19/APA19 and the patient's care was started at 4:20PM    CSN: [id number]  Arrival date & time 12/10/12  1547   First MD Initiated Contact with Patient 12/10/12 1620      Chief Complaint  Patient presents with  . Emesis    (Consider location/radiation/quality/duration/timing/severity/associated sxs/prior treatment) The history is provided by the patient. No language interpreter was used.    [name] is a 66 y.o. male , with a hx of CAD and cardiac stent placement, who presents to the Emergency Department complaining of sudden, progressively worsening, emesis (multiple episodes), onset five days ago (12/05/12). Associated symptoms include diarrhea and diffuse abdominal pain. The pt reports he has been afraid to eat and has not been able to keep any foods nor fluids down for the past five days. The last episode of emesis was this morning (12/10/12). Patient was sent by [facility].  The pt denies dizziness and light headedness.   The pt does not smoke or drink alcohol.   PCP is Dr. [name].    Past Medical History  Diagnosis Date  . Diastolic heart failure     Pedal edema  . Hypertension     Exercise induced  . NSVT (nonsustained ventricular tachycardia)     Exercise induced  . 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 Obesity   . Degenerative joint disease   . Benign prostatic hypertrophy     Nocturia  . Erectile dysfunction   . High cholesterol   . Obstructive sleep apnea     Probable sleep study to be done  . CAD (coronary artery disease)     a.  NSTEMI 10/2012 s/p DES to LAD & DES to 1st diagonal with residual diffuse nonobstructive dz in LCx/RCA   . Chronic kidney disease, stage 3, mod decreased GFR     Creatinine of 1.5 in 03/2009    Past Surgical History  Procedure Date  . Back surgery     Family History  Problem Relation  Age of Onset  . Hypertension Other   . Cancer Other   . Heart disease    . Diabetes      History  Substance Use Topics  . Smoking status: Former Smoker -- 1.0 packs/day for 30 years    Quit date: 12/23/1992  . Smokeless tobacco: Not on file  . Alcohol Use: No      Review of Systems  At least 10pt or greater review of systems completed and are negative except where specified in the HPI.   Allergies  Niacin and related  Home Medications   Current Outpatient Rx  Name  Route  Sig  Dispense  Refill  . ALLOPURINOL 300 MG PO TABS   Oral   Take 300 mg by mouth daily.         Marland Kitchen ALPRAZOLAM 1 MG PO TABS   Oral   Take 0.5-1 mg by mouth 3 (three) times daily as needed. For anxiety         . AMLODIPINE BESYLATE 10 MG PO TABS   Oral   Take 1 tablet (10 mg total) by mouth daily.   90 tablet   1   . ASPIRIN 81 MG PO CHEW   Oral   Chew 81 mg by mouth daily.         Marland Kitchen  FUROSEMIDE 40 MG PO TABS   Oral   Take 40 mg by mouth 2 (two) times daily.          Marland Kitchen METOPROLOL TARTRATE 50 MG PO TABS   Oral   Take 1 tablet (50 mg total) by mouth 2 (two) times daily.   60 tablet   6   . NITROGLYCERIN 0.4 MG SL SUBL   Sublingual   Place 1 tablet (0.4 mg total) under the tongue every 5 (five) minutes as needed for chest pain (up to 3 doses). WARNING: Do not take this medicine if you have taken Levitra within the past 24 hours.   25 tablet   4   . PRAVASTATIN SODIUM 20 MG PO TABS   Oral   Take 20 mg by mouth daily.         Marland Kitchen TICAGRELOR 90 MG PO TABS   Oral   Take 1 tablet (90 mg total) by mouth 2 (two) times daily.   60 tablet   10   . VERAPAMIL HCL 80 MG PO TABS   Oral   Take 80 mg by mouth 4 (four) times daily.           BP 138/77  Pulse 75  Temp 97.7 F (36.5 C) (Oral)  Resp 20  Ht 6' 3"  (1.905 m)  Wt 271 lb (122.925 kg)  BMI 33.87 kg/m2  SpO2 100%  Physical Exam  Nursing notes reviewed.  Electronic medical record reviewed. VITAL SIGNS:   Filed  Vitals:   12/10/12 1609 12/10/12 1805 12/10/12 2006  BP: 138/77 158/79 123/73  Pulse: 75 72 80  Temp: 97.7 F (36.5 C)  97.7 F (36.5 C)  TempSrc: Oral  Oral  Resp: 20 18 18   Height: 6' 3"  (1.905 m)  6' 3"  (1.905 m)  Weight: 271 lb (122.925 kg)  274 lb 9.6 oz (124.558 kg)  SpO2: 100% 100% 100%   CONSTITUTIONAL: Awake, oriented, appears non-toxic HENT: Atraumatic, normocephalic, oral mucosa pink and moist, airway patent. Nares patent without drainage. External ears normal. EYES: Conjunctiva clear, EOMI, PERRLA NECK: Trachea midline, non-tender, supple CARDIOVASCULAR: Normal heart rate, Normal rhythm PULMONARY/CHEST: Clear to auscultation, no rhonchi, wheezes, or rales. Symmetrical breath sounds. Non-tender. ABDOMINAL: Non-distended, soft, non-tender - no rebound or guarding.  BS normal. NEUROLOGIC: Non-focal, moving all four extremities, no gross sensory or motor deficits. EXTREMITIES: No clubbing, cyanosis, or edema SKIN: Warm, Dry, No erythema, No rash  ED Course  Procedures (including critical care time)  Date: 12/10/2012  Rate: 69  Rhythm: normal sinus rhythm  QRS Axis: normal  Intervals: normal  ST/T Wave abnormalities: normal  Conduction Disutrbances: First degree AV block  Narrative Interpretation: Question anterior infarct, age undetermined. Patient has T-wave inversions in the lateral leads-seen on prior EKG dated 11/16/2012; no acute infarction or ischemia  DIAGNOSTIC STUDIES: Oxygen Saturation is 100% on room air, normal by my interpretation.    COORDINATION OF CARE:   4:28 PM- Treatment plan concerning IV fluids and EKG discussed with patient. Pt agrees with treatment.   5:30PM- Recheck. Pt reports feeling better. Treatment plan discussed with pt. Pt agrees with treatment.    Results for orders placed during the hospital encounter of 50/38/88  BASIC METABOLIC PANEL      Component Value Range   Sodium 124 (*) 135 - 145 mEq/L   Potassium 4.1  3.5 - 5.1 mEq/L    Chloride 85 (*) 96 - 112 mEq/L   CO2 23  19 - 32  mEq/L   Glucose, Bld 327 (*) 70 - 99 mg/dL   BUN 99 (*) 6 - 23 mg/dL   Creatinine, Ser 3.21 (*) 0.50 - 1.35 mg/dL   Calcium 10.0  8.4 - 10.5 mg/dL   GFR calc non Af Amer 19 (*) >90 mL/min   GFR calc Af Amer 21 (*) >90 mL/min  TROPONIN I      Component Value Range   Troponin I <0.30  <0.30 ng/mL      No results found.   1. AGE (acute gastroenteritis)   2. Dehydration   3. Acute kidney injury       MDM  EFFIE JANOSKI is a 66 y.o. male whom I had the pleasure of meeting about a month ago when he presented to the emergency department early myocardial infarction was transferred to [facility] cone and received 2 subsequent stents. Patient returns to the emergency department at request of his primary care physician, he's been dehydrated from what sounds like acute gastroenteritis-he feels like he is better today, but was sent for fluids by [facility]. He denies any chest pain or shortness of breath. Do not think he needs symptoms are related to his heart. Check labs and give IV fluids and Zofran. . EKG is nonischemic. Patient is feeling better with respect to nausea-however patient does have a sodium of 124, chloride of 85 with a BUN of 99 and a creatinine of 3.21 suggesting some acute kidney injury secondary to prerenal failure/dehydration. He's not acidotic-glucose is slightly elevated at 327. Troponin is negative.  Discussed with hospitalist for admission secondary to dehydration, acute kidney injury and comorbidities. Patient is beta blocked and likely unable to mount a tachycardic response to dehydration.   I personally performed the services described in this documentation, which was scribed in my presence. The recorded information has been reviewed and is accurate. [name], M.D.     [name] Croft, MD 12/10/12 2322

## 2012-12-10 NOTE — H&P (Addendum)
[facility] History and Physical  TORIANO AIKEY KCL:[id number] DOB: 1946/08/14 DOA: 12/10/2012  Referring physician: Dr. Rhunette Croft PCP: [name], MD  Specialists: None  Chief Complaint: Nausea and vomiting  HPI: [name] is a 66 y.o. male with history of newly diagnosed CAD, non-ST elevation MI, cardiac catheterization on 11/14/12 and subsequent drug-eluting stent to LAD and drug-eluting stent to first diagonal with residual diffuse nonobstructive disease in the left circumflex and right coronary artery, normal LV function, HTN, stage III chronic kidney disease, normocytic anemia, chronic diastolic congestive heart failure, gout presents to ED on 12/19 with nausea and vomiting. Symptoms started approximately 5-6 days ago. He has 3-4 episodes of emesis daily consisting of food and drinks that he consumes, not associated with abdominal pain or diarrhea. No fever or chills. No sickly contacts. Nausea seems to be a predominant symptom and patient has not been able to keep significant by mouth intake. He slightly weak but denies dizziness, lightheadedness or passing out. Evaluation in ED reveals hyponatremia, acute on chronic kidney disease. The hospitalist service is requested to admit for further evaluation and management. Patient denies chest pain or dyspnea. He has been compliant with medications however.   Review of Systems: All systems reviewed and apart from history of presenting illness, are negative.  Past Medical History  Diagnosis Date  . Diastolic heart failure     Pedal edema  . Hypertension     Exercise induced  . NSVT (nonsustained ventricular tachycardia)     Exercise induced  . 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 Obesity   . Degenerative joint disease   . Benign prostatic hypertrophy     Nocturia  . Erectile dysfunction   . High cholesterol   . Obstructive sleep apnea     Probable sleep study to be done  . CAD (coronary  artery disease)     a.  NSTEMI 10/2012 s/p DES to LAD & DES to 1st diagonal with residual diffuse nonobstructive dz in LCx/RCA   . Chronic kidney disease, stage 3, mod decreased GFR     Creatinine of 1.5 in 03/2009   Past Surgical History  Procedure Date  . Back surgery    Social History:  reports that he quit smoking about 19 years ago. He does not have any smokeless tobacco history on file. He reports that he does not drink alcohol or use illicit drugs. Widowed. Lives alone. Independent of activities of daily living.  Allergies  Allergen Reactions  . Niacin And Related Other (See Comments)    Reaction: flushing and burning side effects even with extended release    Family History  Problem Relation Age of Onset  . Hypertension Other   . Cancer Other   . Heart disease    . Diabetes      Prior to Admission medications   Medication Sig Start Date End Date Taking? Authorizing Provider  allopurinol (ZYLOPRIM) 300 MG tablet Take 300 mg by mouth daily.   Yes Historical Provider, MD  ALPRAZolam Duanne Moron) 1 MG tablet Take 0.5-1 mg by mouth 3 (three) times daily as needed. For anxiety   Yes Historical Provider, MD  amLODipine (NORVASC) 10 MG tablet Take 1 tablet (10 mg total) by mouth daily. 11/26/12  Yes Lendon Colonel, NP  aspirin 81 MG chewable tablet Chew 81 mg by mouth daily.   Yes Historical Provider, MD  furosemide (LASIX) 40 MG tablet Take 40 mg by  mouth 2 (two) times daily.  08/23/11  Yes Yehuda Savannah, MD  metoprolol (LOPRESSOR) 50 MG tablet Take 1 tablet (50 mg total) by mouth 2 (two) times daily. 11/17/12  Yes Dayna N Dunn, PA  nitroGLYCERIN (NITROSTAT) 0.4 MG SL tablet Place 1 tablet (0.4 mg total) under the tongue every 5 (five) minutes as needed for chest pain (up to 3 doses). WARNING: Do not take this medicine if you have taken Levitra within the past 24 hours. 11/17/12  Yes Dayna N Dunn, PA  pravastatin (PRAVACHOL) 20 MG tablet Take 20 mg by mouth daily.   Yes Historical  Provider, MD  Ticagrelor (BRILINTA) 90 MG TABS tablet Take 1 tablet (90 mg total) by mouth 2 (two) times daily. 11/17/12  Yes Dayna N Dunn, PA  verapamil (CALAN) 80 MG tablet Take 80 mg by mouth 4 (four) times daily.   Yes Historical Provider, MD   Physical Exam: Filed Vitals:   12/10/12 1609 12/10/12 1805 12/10/12 2006  BP: 138/77 158/79 123/73  Pulse: 75 72 80  Temp: 97.7 F (36.5 C)  97.7 F (36.5 C)  TempSrc: Oral  Oral  Resp: 20 18 18   Height: 6' 3"  (1.905 m)  6' 3"  (1.905 m)  Weight: 122.925 kg (271 lb)  124.558 kg (274 lb 9.6 oz)  SpO2: 100% 100% 100%     General exam: Moderately built and nourished male patient lying comfortably supine on the gurney.  Head, eyes and ENT: Nontraumatic and normocephalic. Bilateral cataracts. Oral mucosa is dry. Pupils equally reacting to light and accommodation.  Lymphatic: No lymphadenopathy.  Neck: Supple. No JVD, carotid bruit or thyromegaly.  Respiratory system: Clear to auscultation. No increased work of breathing.  Cardiovascular system: First and second heart sounds heard, regular rate and rhythm. No JVD, murmurs or gallops. No pedal edema.  Gastrointestinal system: Abdomen is nondistended soft and nontender. Normal bowel sounds heard. No organomegaly or masses appreciated.  Central nervous system: Alert and oriented. No focal neurological deficit.  Extremities: Symmetric 5 x 5 power. Peripheral pulses symmetrically felt. 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  Skin: Without acute findings.  Muscular skeletal system: Negative exam.  Labs on Admission:  Basic Metabolic Panel:  Lab 92/42/68 1630  NA 124*  K 4.1  CL 85*  CO2 23  GLUCOSE 327*  BUN 99*  CREATININE 3.21*  CALCIUM 10.0  MG --  PHOS --   Liver Function Tests: No results found for this basename: AST:5,ALT:5,ALKPHOS:5,BILITOT:5,PROT:5,ALBUMIN:5 in the last 168 hours No results found for this basename: LIPASE:5,AMYLASE:5 in the last 168 hours No results found for  this basename: AMMONIA:5 in the last 168 hours CBC:  Lab 12/10/12 2007  WBC 13.8*  NEUTROABS 12.4*  HGB 10.8*  HCT 31.7*  MCV 90.1  PLT 236   Cardiac Enzymes:  Lab 12/10/12 1630  CKTOTAL --  CKMB --  CKMBINDEX --  TROPONINI <0.30    BNP (last 3 results)  Basename 11/15/12 0015  PROBNP 86.0   CBG:  Lab 12/10/12 2004  GLUCAP 244*    Radiological Exams on Admission: No results found.  EKG: Independently reviewed. Sinus rhythm with first-degree A-V block, T wave inversion in lateral leads but no acute changes.  Assessment/Plan Principal Problem:  *Nausea and vomiting Active Problems:  Hypertension  Obstructive sleep apnea  CAD S/P percutaneous coronary angioplasty  Dehydration  Renal failure, acute on chronic  Hyponatremia  Diabetes mellitus   1. Nausea and vomiting: Possibly viral gastroenteritis. Diet as tolerated. Antiemetics. Monitor.  2. Hyponatremic dehydration: Secondary to problem #1 and diuretics. Temporarily hold diuretics. Brief IV fluids and monitor. Some of the hyponatremia is secondary to hyperglycemia. 3. Acute on stage III chronic kidney disease: Secondary to dehydration. Brief IV fluids and follow up BMP in a.m. 4. Newly diagnosed type 2 diabetes mellitus: Hemoglobin A1c on 11/24 was 6. Diabetic diet and sliding scale insulin. 5. CAD status post recent PCI: Asymptomatic of chest pain or dyspnea. Continue home cardiac medications. 6. HTN: Reasonably controlled. Continue verapamil, amlodipine and metoprolol. 7. Anemia: No recent CBCs. Request CBC stat follow.    Code Status:  full code  Family Communication: discussed with patient's nephew Mr. Jeanne Ivan at bedside.  Disposition Plan:  less than 2 midnight. Home when stable.   Time spent: 55 minutes.   [facility] Pager [phone].   If 7PM-7AM, please contact night-coverage [email or web address] Password [email or web address] 12/10/2012, 11:54 PM

## 2012-12-11 DIAGNOSIS — K5289 Other specified noninfective gastroenteritis and colitis: Secondary | ICD-10-CM

## 2012-12-11 DIAGNOSIS — N183 Chronic kidney disease, stage 3 unspecified: Secondary | ICD-10-CM

## 2012-12-11 LAB — GLUCOSE, CAPILLARY
Glucose-Capillary: 209 mg/dL — ABNORMAL HIGH (ref 70–99)
Glucose-Capillary: 304 mg/dL — ABNORMAL HIGH (ref 70–99)

## 2012-12-11 LAB — BASIC METABOLIC PANEL
Calcium: 9.6 mg/dL (ref 8.4–10.5)
Creatinine, Ser: 2.55 mg/dL — ABNORMAL HIGH (ref 0.50–1.35)
GFR calc Af Amer: 28 mL/min — ABNORMAL LOW (ref >90)
Sodium: 130 mEq/L — ABNORMAL LOW (ref 135–145)

## 2012-12-11 LAB — CBC
Platelets: 214 10*3/uL (ref 150–400)
RBC: 3.4 MIL/uL — ABNORMAL LOW (ref 4.22–5.81)
RDW: 13.4 % (ref 11.5–15.5)
WBC: 11.6 10*3/uL — ABNORMAL HIGH (ref 4.0–10.5)

## 2012-12-11 MED ORDER — MOMETASONE FURO-FORMOTEROL FUM 100-5 MCG/ACT IN AERO
INHALATION_SPRAY | RESPIRATORY_TRACT | Status: AC
  Filled 2012-12-11: qty 8.8

## 2012-12-11 MED ORDER — PRO-STAT SUGAR FREE PO LIQD
30.0000 mL | Freq: Three times a day (TID) | ORAL | Status: DC
  Administered 2012-12-11 – 2012-12-13 (×5): 30 mL via ORAL
  Filled 2012-12-11 (×5): qty 30

## 2012-12-11 MED ORDER — PNEUMOCOCCAL VAC POLYVALENT 25 MCG/0.5ML IJ INJ
0.5000 mL | INJECTION | INTRAMUSCULAR | Status: AC
  Administered 2012-12-12: 0.5 mL via INTRAMUSCULAR
  Filled 2012-12-11: qty 0.5

## 2012-12-11 MED ORDER — INSULIN DETEMIR 100 UNIT/ML ~~LOC~~ SOLN
10.0000 [IU] | Freq: Every day | SUBCUTANEOUS | Status: DC
  Administered 2012-12-11 – 2012-12-13 (×3): 10 [IU] via SUBCUTANEOUS
  Filled 2012-12-11: qty 10

## 2012-12-11 NOTE — Progress Notes (Signed)
[facility]             Progress Note   Subjective: Patient is feeling better today.  Had small amount of breakfast this morning.  No vomiting today  Objective: Vital signs in last 24 hours: Temp:  [97.7 F (36.5 C)-98.4 F (36.9 C)] 98.4 F (36.9 C) (12/20 0442) Pulse Rate:  [60-80] 60  (12/20 0723) Resp:  [18-20] 20  (12/20 0723) BP: (123-158)/(66-79) 129/66 mmHg (12/20 0442) SpO2:  [96 %-100 %] 96 % (12/20 0723) Weight:  [122.925 kg (271 lb)-125.51 kg (276 lb 11.2 oz)] 125.51 kg (276 lb 11.2 oz) (12/20 0442) Weight change:  Last BM Date: 12/10/12  Intake/Output from previous day: 12/19 0701 - 12/20 0700 In: 1155.8 [P.O.:480; I.V.:663.8; IV Piggyback:12] Out: 1100 [Urine:1100] Total I/O In: -  Out: 300 [Urine:300]   Physical Exam: General: Alert, awake, oriented x3, in no acute distress. HEENT: No bruits, no goiter. Heart: Regular rate and rhythm, without murmurs, rubs, gallops. Lungs: Clear to auscultation bilaterally. Abdomen: Soft, nontender, nondistended, positive bowel sounds. Extremities: No clubbing cyanosis or edema with positive pedal pulses. Neuro: Grossly intact, nonfocal.    Lab Results: Basic Metabolic Panel:  Basename 12/11/12 0437 12/10/12 1630  NA 130* 124*  K 4.7 4.1  CL 95* 85*  CO2 23 23  GLUCOSE 201* 327*  BUN 86* 99*  CREATININE 2.55* 3.21*  CALCIUM 9.6 10.0  MG -- --  PHOS -- --   Liver Function Tests: No results found for this basename: AST:2,ALT:2,ALKPHOS:2,BILITOT:2,PROT:2,ALBUMIN:2 in the last 72 hours No results found for this basename: LIPASE:2,AMYLASE:2 in the last 72 hours No results found for this basename: AMMONIA:2 in the last 72 hours CBC:  Basename 12/11/12 0437 12/10/12 2007  WBC 11.6* 13.8*  NEUTROABS -- 12.4*  HGB 10.3* 10.8*  HCT 30.6* 31.7*  MCV 90.0 90.1  PLT 214 236   Cardiac Enzymes:  Basename 12/10/12 1630  CKTOTAL --  CKMB --  CKMBINDEX --  TROPONINI <0.30   BNP: No results  found for this basename: PROBNP:3 in the last 72 hours D-Dimer: No results found for this basename: DDIMER:2 in the last 72 hours CBG:  Basename 12/11/12 1126 12/11/12 0727 12/10/12 2004  GLUCAP 268* 209* 244*   Hemoglobin A1C: No results found for this basename: HGBA1C in the last 72 hours Fasting Lipid Panel: No results found for this basename: CHOL,HDL,LDLCALC,TRIG,CHOLHDL,LDLDIRECT in the last 72 hours Thyroid Function Tests: No results found for this basename: TSH,T4TOTAL,FREET4,T3FREE,THYROIDAB in the last 72 hours Anemia Panel: No results found for this basename: VITAMINB12,FOLATE,FERRITIN,TIBC,IRON,RETICCTPCT in the last 72 hours Coagulation: No results found for this basename: LABPROT:2,INR:2 in the last 72 hours Urine Drug Screen: Drugs of Abuse  No results found for this basename: labopia, cocainscrnur, labbenz, amphetmu, thcu, labbarb    Alcohol Level: No results found for this basename: ETH:2 in the last 72 hours Urinalysis: No results found for this basename: COLORURINE:2,APPERANCEUR:2,LABSPEC:2,PHURINE:2,GLUCOSEU:2,HGBUR:2,BILIRUBINUR:2,KETONESUR:2,PROTEINUR:2,UROBILINOGEN:2,NITRITE:2,LEUKOCYTESUR:2 in the last 72 hours  No results found for this or any previous visit (from the past 240 hour(s)).  Studies/Results: No results found.  Medications: Scheduled Meds:   . allopurinol  300 mg Oral Daily  . amLODipine  10 mg Oral Daily  . aspirin  81 mg Oral Daily  . feeding supplement  30 mL Oral TID WC  . insulin aspart  0-5 Units Subcutaneous QHS  . insulin aspart  0-9 Units Subcutaneous TID WC  . metoprolol  50 mg Oral BID  . pantoprazole (PROTONIX) IV  40 mg  Intravenous Q24H  . pneumococcal 23 valent vaccine  0.5 mL Intramuscular Tomorrow-1000  . simvastatin  10 mg Oral q1800  . Ticagrelor  90 mg Oral BID  . verapamil  80 mg Oral QID   Continuous Infusions:   . sodium chloride 75 mL/hr at 12/11/12 1217   PRN Meds:.acetaminophen, acetaminophen, albuterol,  ALPRAZolam, nitroGLYCERIN, ondansetron (ZOFRAN) IV, ondansetron  Assessment/Plan:  Principal Problem:  *Nausea and vomiting Active Problems:  Hypertension  Obstructive sleep apnea  CAD S/P percutaneous coronary angioplasty  Dehydration  Renal failure, acute on chronic  Hyponatremia  Diabetes mellitus  1. Nausea and vomiting: Possibly viral gastroenteritis. Diet as tolerated. Antiemetics. Monitor. Appears to be improving. 2. Hyponatremic dehydration: Secondary to problem #1 and diuretics. Temporarily hold diuretics. Brief IV fluids and monitor. Some of the hyponatremia is secondary to hyperglycemia. Improving. 3. Acute on stage III chronic kidney disease: Secondary to dehydration. Improving with IV fluids. 4. Newly diagnosed type 2 diabetes mellitus: Hemoglobin A1c on 11/24 was 6. Diabetic diet and sliding scale insulin. Will start levemir. 5. CAD status post recent PCI: Asymptomatic of chest pain or dyspnea. Continue home cardiac medications. 6. HTN: Reasonably controlled. Continue verapamil, amlodipine and metoprolol. 7. Anemia: No recent CBCs. Hemoglobin stable.  Time spent coordinating care: 65mns   LOS: 1 day   [facility] Pager: [phone]/20/2013, 12:41 PM

## 2012-12-11 NOTE — Progress Notes (Addendum)
INITIAL NUTRITION ASSESSMENT  DOCUMENTATION CODES Per approved criteria  -Severe malnutrition in the context of acute illness or injury   INTERVENTION: Add ProStat 30 ml TID with meals (300 kcal, 45 gr protein daily)  NUTRITION DIAGNOSIS: Inadequate oral intake related to nausea, vomiting as evidenced by multiple episodes of emesis PTA x 5 days.   Goal: Pt to meet >/= 90% of their estimated nutrition needs; not met  Monitor:  Monitor diet tolerance, meal intake  wt trends and adequacy or oral intake  Reason for Assessment: Malnutrition Screen (Score=2)  66 y.o. male  Admitting Dx: Nausea and vomiting  ASSESSMENT: Pt wt hx shows significant wt loss of 18#, 8% x 45 days. He reports nausea, vomiting PTA 5-6 days. He is also s/p stent placement 3-4 weeks ago per pt.  He meets criteria for severe malnutrition in the context of acute illness given his energy intake </=50% for >/= 5d and wt loss of > 7.5% / 3 months.   Height: Ht Readings from Last 1 Encounters:  12/10/12 6' 3"  (1.905 m)    Weight: Wt Readings from Last 1 Encounters:  12/11/12 276 lb 11.2 oz (125.51 kg)    Ideal Body Weight: 196# (89 kg)  % Ideal Body Weight: 141%  Wt Readings from Last 10 Encounters:  12/11/12 276 lb 11.2 oz (125.51 kg)  11/24/12 288 lb (130.636 kg)  11/17/12 289 lb 7.4 oz (131.3 kg)  11/17/12 289 lb 7.4 oz (131.3 kg)  10/24/12 295 lb (133.811 kg)  08/19/12 296 lb (134.265 kg)  08/23/11 293 lb (132.904 kg)  05/15/10 304 lb (137.893 kg)    Usual Body Weight: 295# (133.8 kg)  % Usual Body Weight: 94%  BMI:  Body mass index is 34.59 kg/(m^2). Obesity Class I  Estimated Nutritional Needs: Kcal: 7425-9563 kcal  Protein: 115-133 gr/day Fluid: 1 ml/kcal  Skin: No issues noted  Diet Order: Cardiac  EDUCATION NEEDS: -Education needs addressed   Intake/Output Summary (Last 24 hours) at 12/11/12 0959 Last data filed at 12/11/12 0840  Gross per 24 hour  Intake 1155.75 ml   Output   1400 ml  Net -244.25 ml    Last BM: 12/10/12  Labs:   Lab 12/11/12 0437 12/10/12 1630  NA 130* 124*  K 4.7 4.1  CL 95* 85*  CO2 23 23  BUN 86* 99*  CREATININE 2.55* 3.21*  CALCIUM 9.6 10.0  MG -- --  PHOS -- --  GLUCOSE 201* 327*    CBG (last 3)   Basename 12/11/12 0727 12/10/12 2004  GLUCAP 209* 244*    Scheduled Meds:   . allopurinol  300 mg Oral Daily  . amLODipine  10 mg Oral Daily  . aspirin  81 mg Oral Daily  . insulin aspart  0-5 Units Subcutaneous QHS  . insulin aspart  0-9 Units Subcutaneous TID WC  . metoprolol  50 mg Oral BID  . pantoprazole (PROTONIX) IV  40 mg Intravenous Q24H  . simvastatin  10 mg Oral q1800  . Ticagrelor  90 mg Oral BID  . verapamil  80 mg Oral QID    Continuous Infusions:   . sodium chloride 75 mL/hr at 12/10/12 2109    Past Medical History  Diagnosis Date  . Diastolic heart failure     Pedal edema  . Hypertension     Exercise induced  . NSVT (nonsustained ventricular tachycardia)     Exercise induced  . Tobacco abuse, in remission     30 pack  years, quit 1994  . Obesity   . Degenerative joint disease   . Benign prostatic hypertrophy     Nocturia  . Erectile dysfunction   . High cholesterol   . Obstructive sleep apnea     Probable sleep study to be done  . CAD (coronary artery disease)     a.  NSTEMI 10/2012 s/p DES to LAD & DES to 1st diagonal with residual diffuse nonobstructive dz in LCx/RCA   . Chronic kidney disease, stage 3, mod decreased GFR     Creatinine of 1.5 in 03/2009    Past Surgical History  Procedure Date  . Back surgery     [phone]

## 2012-12-11 NOTE — Progress Notes (Signed)
UR Chart Review Completed  

## 2012-12-12 DIAGNOSIS — I2510 Atherosclerotic heart disease of native coronary artery without angina pectoris: Secondary | ICD-10-CM

## 2012-12-12 DIAGNOSIS — Z9861 Coronary angioplasty status: Secondary | ICD-10-CM

## 2012-12-12 LAB — BASIC METABOLIC PANEL
BUN: 60 mg/dL — ABNORMAL HIGH (ref 6–23)
CO2: 23 mEq/L (ref 19–32)
Chloride: 99 mEq/L (ref 96–112)
Creatinine, Ser: 2 mg/dL — ABNORMAL HIGH (ref 0.50–1.35)
Glucose, Bld: 184 mg/dL — ABNORMAL HIGH (ref 70–99)

## 2012-12-12 LAB — CBC
HCT: 30.9 % — ABNORMAL LOW (ref 39.0–52.0)
MCH: 31.1 pg (ref 26.0–34.0)
MCHC: 34 g/dL (ref 30.0–36.0)
MCV: 91.4 fL (ref 78.0–100.0)
RDW: 14.6 % (ref 11.5–15.5)
WBC: 11.2 10*3/uL — ABNORMAL HIGH (ref 4.0–10.5)

## 2012-12-12 LAB — GLUCOSE, CAPILLARY
Glucose-Capillary: 182 mg/dL — ABNORMAL HIGH (ref 70–99)
Glucose-Capillary: 307 mg/dL — ABNORMAL HIGH (ref 70–99)
Glucose-Capillary: 191 mg/dL — ABNORMAL HIGH (ref 70–99)

## 2012-12-12 LAB — HEPATIC FUNCTION PANEL
AST: 18 U/L (ref 0–37)
Bilirubin, Direct: 0.2 mg/dL (ref 0.0–0.3)
Indirect Bilirubin: 0.4 mg/dL (ref 0.3–0.9)
Total Bilirubin: 0.6 mg/dL (ref 0.3–1.2)

## 2012-12-12 MED ORDER — LIVING WELL WITH DIABETES BOOK
Freq: Once | Status: AC
  Administered 2012-12-12: 13:00:00
  Filled 2012-12-12: qty 1

## 2012-12-12 MED ORDER — PANTOPRAZOLE SODIUM 40 MG PO TBEC
40.0000 mg | DELAYED_RELEASE_TABLET | Freq: Every day | ORAL | Status: DC
  Administered 2012-12-12: 40 mg via ORAL
  Filled 2012-12-12: qty 1

## 2012-12-12 MED ORDER — SODIUM CHLORIDE 0.9 % IV SOLN
INTRAVENOUS | Status: DC
  Administered 2012-12-12: 12:00:00 via INTRAVENOUS
  Administered 2012-12-13: 75 mL/h via INTRAVENOUS

## 2012-12-12 MED ORDER — INSULIN ASPART 100 UNIT/ML ~~LOC~~ SOLN
5.0000 [IU] | Freq: Three times a day (TID) | SUBCUTANEOUS | Status: DC
  Administered 2012-12-12 – 2012-12-13 (×4): 5 [IU] via SUBCUTANEOUS

## 2012-12-12 NOTE — Progress Notes (Signed)
The patient is receiving Protonix by the intravenous route.  Based on criteria approved by the Pharmacy and [facility], the medication is being converted to the equivalent oral dose form.  These criteria include: -No Active GI bleeding -Able to tolerate diet of full liquids (or better) or tube feeding OR able to tolerate other medications by the oral or enteral route  If you have any questions about this conversion, please contact the Pharmacy Department (ext [phone]).  Thank you.  Pricilla Larsson, [facility] 12/12/2012 10:46 AM

## 2012-12-12 NOTE — Progress Notes (Signed)
12/12/12 1344 Patient watching diabetes education videos on patient education channel, since newly diagnosed diabetes. Reviewed basic diabetes education and insulin administration with patient while in hospital, encouraged patient to notify nursing of any questions/concerns regarding diabetes education. Donavan Foil, RN

## 2012-12-12 NOTE — Progress Notes (Signed)
[facility]             Progress Note   Subjective: Patient says he feels unsteady on his feet and dizzy today. Otherwise no other complaints.  Objective: Vital signs in last 24 hours: Temp:  [98.1 F (36.7 C)-98.7 F (37.1 C)] 98.1 F (36.7 C) (12/21 0619) Pulse Rate:  [66-99] 96  (12/21 1105) Resp:  [18-20] 20  (12/20 1932) BP: (143-167)/(66-93) 167/93 mmHg (12/21 1105) SpO2:  [98 %-100 %] 100 % (12/21 1105) Weight:  [126 kg (277 lb 12.5 oz)] 126 kg (277 lb 12.5 oz) (12/21 0500) Weight change: 3.075 kg (6 lb 12.5 oz) Last BM Date: 12/10/12  Intake/Output from previous day: 12/20 0701 - 12/21 0700 In: 1733.8 [P.O.:840; I.V.:893.8] Out: 2800 [Urine:2800] Total I/O In: -  Out: 600 [Urine:600]   Physical Exam: General: Alert, awake, oriented x3, in no acute distress. HEENT: No bruits, no goiter. Heart: Regular rate and rhythm, without murmurs, rubs, gallops. Lungs: Clear to auscultation bilaterally. Abdomen: Soft, nontender, nondistended, positive bowel sounds. Extremities: No clubbing cyanosis or edema with positive pedal pulses. Neuro: Grossly intact, nonfocal.    Lab Results: Basic Metabolic Panel:  Basename 12/12/12 0606 12/11/12 0437  NA 132* 130*  K 4.7 4.7  CL 99 95*  CO2 23 23  GLUCOSE 184* 201*  BUN 60* 86*  CREATININE 2.00* 2.55*  CALCIUM 9.6 9.6  MG -- --  PHOS -- --   Liver Function Tests:  [facility] 12/12/12 0606  AST 18  ALT 25  ALKPHOS 63  BILITOT 0.6  PROT 7.6  ALBUMIN 3.3*    Basename 12/12/12 0606  LIPASE 96*  AMYLASE --   No results found for this basename: AMMONIA:2 in the last 72 hours CBC:  Basename 12/12/12 0606 12/11/12 0437 12/10/12 2007  WBC 11.2* 11.6* --  NEUTROABS -- -- 12.4*  HGB 10.5* 10.3* --  HCT 30.9* 30.6* --  MCV 91.4 90.0 --  PLT 205 214 --   Cardiac Enzymes:  Basename 12/10/12 1630  CKTOTAL --  CKMB --  CKMBINDEX --  TROPONINI <0.30   BNP: No results found for this basename:  PROBNP:3 in the last 72 hours D-Dimer: No results found for this basename: DDIMER:2 in the last 72 hours CBG:  Basename 12/12/12 1101 12/12/12 0758 12/11/12 2024 12/11/12 1650 12/11/12 1126 12/11/12 0727  GLUCAP 307* 182* 336* 304* 268* 209*   Hemoglobin A1C: No results found for this basename: HGBA1C in the last 72 hours Fasting Lipid Panel: No results found for this basename: CHOL,HDL,LDLCALC,TRIG,CHOLHDL,LDLDIRECT in the last 72 hours Thyroid Function Tests: No results found for this basename: TSH,T4TOTAL,FREET4,T3FREE,THYROIDAB in the last 72 hours Anemia Panel: No results found for this basename: VITAMINB12,FOLATE,FERRITIN,TIBC,IRON,RETICCTPCT in the last 72 hours Coagulation: No results found for this basename: LABPROT:2,INR:2 in the last 72 hours Urine Drug Screen: Drugs of Abuse  No results found for this basename: labopia,  cocainscrnur,  labbenz,  amphetmu,  thcu,  labbarb    Alcohol Level: No results found for this basename: ETH:2 in the last 72 hours Urinalysis: No results found for this basename: COLORURINE:2,APPERANCEUR:2,LABSPEC:2,PHURINE:2,GLUCOSEU:2,HGBUR:2,BILIRUBINUR:2,KETONESUR:2,PROTEINUR:2,UROBILINOGEN:2,NITRITE:2,LEUKOCYTESUR:2 in the last 72 hours  No results found for this or any previous visit (from the past 240 hour(s)).  Studies/Results: No results found.  Medications: Scheduled Meds:    . allopurinol  300 mg Oral Daily  . amLODipine  10 mg Oral Daily  . aspirin  81 mg Oral Daily  . feeding supplement  30 mL Oral TID WC  .  insulin aspart  0-5 Units Subcutaneous QHS  . insulin aspart  0-9 Units Subcutaneous TID WC  . insulin detemir  10 Units Subcutaneous Daily  . metoprolol  50 mg Oral BID  . pantoprazole  40 mg Oral QHS  . simvastatin  10 mg Oral q1800  . Ticagrelor  90 mg Oral BID  . verapamil  80 mg Oral QID   Continuous Infusions:  PRN Meds:.acetaminophen, acetaminophen, albuterol, ALPRAZolam, nitroGLYCERIN, ondansetron (ZOFRAN) IV,  ondansetron  Assessment/Plan:  Principal Problem:  *Nausea and vomiting Active Problems:  Hypertension  Obstructive sleep apnea  CAD S/P percutaneous coronary angioplasty  Dehydration  Renal failure, acute on chronic  Hyponatremia  Diabetes mellitus  1. Nausea and vomiting: Possibly viral gastroenteritis. Diet as tolerated. Antiemetics. Monitor. Appears to be improving. 2. Hyponatremic dehydration: Secondary to problem #1 and diuretics. Temporarily hold diuretics. Continue IV fluids and monitor. Some of the hyponatremia is secondary to hyperglycemia. Improving. 3. Acute on stage III chronic kidney disease: Secondary to dehydration. Improving with IV fluids. I think the patient would benefit from one more day of IV fluids. 4. Newly diagnosed type 2 diabetes mellitus: Hemoglobin A1c on 11/24 was 6. Diabetic diet and sliding scale insulin. Patient started on levemir.  Will add novolog for meal coverage.  Will need diabetic teaching and home health set up prior to discharge. 5. CAD status post recent PCI: Asymptomatic of chest pain or dyspnea. Continue home cardiac medications. 6. HTN: Reasonably controlled. Continue verapamil, and metoprolol. Discontinue amlodipine since she is on 2 calcium channel blockers. 7. Anemia: No recent CBCs. Hemoglobin stable. 8. Dispo.  Will ambulate patient with walker today.  Check orthostatics.  Likely discharge home tomorrow if continued improvement.  Time spent coordinating care: 18mns   LOS: 2 days   [name] [facility] Pager: [phone]/21/2013, 11:38 AM

## 2012-12-12 NOTE — Progress Notes (Signed)
12/12/12 1635 Late entry for this morning about 1130. Patient assisted up to chair this morning, states more comfortable sitting up in chair rather than lying in bed. Discussed fall prevention/safety plan with patient, and instructed to call for assist and not attempt getting up on his own. Stated he understood and would call.  Pt c/o some dizziness and feeling lightheaded this morning when Dr Roderic Palau rounding about 1050, orthostatic BP checked as MD instructed, no c/o dizziness with standing and no drop in BP. CBG - 307 when checked at 1100.  MD requested patient ambulate with assistance in hallway as well. Pt ambulated in hallway with nurse tech standby assist and walker. Tolerated fairly well with general weakness, no further c/o dizziness. Assisted back to chair and instructed to call for assistance when needed. Notified Dr Roderic Palau this morning of CBG result, orthostatic BP obtained, and that pt ambulated with assist. Nursing to continue monitoring. Donavan Foil, RN

## 2012-12-13 ENCOUNTER — Encounter (HOSPITAL_COMMUNITY): Payer: Self-pay | Admitting: Internal Medicine

## 2012-12-13 DIAGNOSIS — E119 Type 2 diabetes mellitus without complications: Secondary | ICD-10-CM

## 2012-12-13 LAB — BASIC METABOLIC PANEL
CO2: 23 mEq/L (ref 19–32)
Chloride: 99 mEq/L (ref 96–112)
GFR calc Af Amer: 47 mL/min — ABNORMAL LOW (ref >90)
Potassium: 4.5 mEq/L (ref 3.5–5.1)

## 2012-12-13 LAB — GLUCOSE, CAPILLARY
Glucose-Capillary: 188 mg/dL — ABNORMAL HIGH (ref 70–99)
Glucose-Capillary: 228 mg/dL — ABNORMAL HIGH (ref 70–99)

## 2012-12-13 LAB — IRON AND TIBC: Saturation Ratios: 23 % (ref 20–55)

## 2012-12-13 LAB — LIPASE, BLOOD: Lipase: 95 U/L — ABNORMAL HIGH (ref 11–59)

## 2012-12-13 MED ORDER — INSULIN NPH ISOPHANE & REGULAR (70-30) 100 UNIT/ML ~~LOC~~ SUSP: 7.0000 [IU] | Freq: Two times a day (BID) | SUBCUTANEOUS | Status: DC

## 2012-12-13 MED ORDER — OMEPRAZOLE 20 MG PO CPDR: 20.0000 mg | DELAYED_RELEASE_CAPSULE | Freq: Every day | ORAL | Status: DC

## 2012-12-13 NOTE — Progress Notes (Signed)
Patient taught how to check his blood sugar and to give himself insulin. Patient demonstrated both actions.

## 2012-12-13 NOTE — Discharge Summary (Addendum)
Physician Discharge Summary  [name] SPQ:[id number] DOB: [birth year] DOA: 12/10/2012  PCP: [name], MD  Admit date: 12/10/2012 Discharge date: 12/13/2012  Time spent: Greater than 30 minutes  Recommendations for Outpatient Follow-up:  1. The patient was instructed to followup with his primary care physician in 3-5 days. 2. He was instructed to check his blood sugars at home at least 2-3 times daily. 3. Ferritin level and total iron pending at the time of discharge.  Discharge Diagnoses:  1. Acute nausea and vomiting, thought to be secondary to a viral gastroenteritis. 2. Newly diagnosed type 2 diabetes mellitus. Hemoglobin A1c 6.0 in November 2013. 3. Stage III chronic kidney disease with acute on chronic renal failure. Baseline creatinine is approximately 1.5. His creatinine was 3.21 admission and 1.69 time of discharge. 4. Hyponatremia secondary to hypovolemia/dehydration and hyperglycemia. 5. Mildly elevated lipase, but not convincingly acute pancreatitis. 6. Coronary artery disease, status post PTCA. Stable. 7. Hypertension. Stable. 8 Obstructive sleep apnea. Stable. 9. Normocytic anemia. 10. Severe malnutrition.  Discharge Condition: Improved.  Diet recommendation: Low-fat and carbohydrate modified.  Filed Weights   12/11/12 0442 12/12/12 0500 12/13/12 0535  Weight: 125.51 kg (276 lb 11.2 oz) 126 kg (277 lb 12.5 oz) 129.638 kg (285 lb 12.8 oz)    History of present illness:  The patient is a 66 year old man with a history significant for newly diagnosed coronary artery disease, status post cardiac catheterization in November 2013 with subsequent drug-eluting stent to the LAD etc., stage III chronic kidney disease, chronic diastolic heart failure, who presented to the emergency department on 12/10/2012 with a chief complaint of nausea and vomiting. He had no appreciable abdominal pain. In the emergency department, he was afebrile and medically stable. His lab  data were significant for a serum sodium of 1/24, glucose of 327, BUN of 99, creatinine of 3.21, WBC of 13.8, and hemoglobin of 10.8. His troponin I was within normal limits. His EKG revealed sinus rhythm with a first degree AV block and T wave inversions in the lateral leads. He was admitted for further evaluation and management.  Hospital Course:  The patient was started on supportive treatment with scheduled antiemetics. IV fluid hydration was started for treatment of hypovolemic hyponatremia/dehydration. Intravenous PPI was started. Lasix was withheld initially. Because of his hyperglycemia, sliding scale NovoLog and Lantus were ordered. He was continued on most of not all of his oral medications as tolerated. Following 24 hours of supportive treatment, his nausea and vomiting resolved. His diet was advanced to clear liquids, then to full liquids, then to solid foods. He tolerated the advancement well. His serum sodium improved with hydration. His hemoglobin A1c was noted to be 6.0 in November, but his venous glucose was consistently in the 200s during this hospitalization. He was clinically diabetic. He received diabetic teaching and education. The nursing staff instructed him on self glucose monitoring and self insulin injections. Oral hypoglycemics for treatment of his diabetes was contemplated, but given his acute and chronic kidney disease, oral medications were not started. Therefore, the patient was instructed on insulin administration for home treatment.  The patient's liver transaminases were within normal limits, however, his lipase was modestly elevated at 96 and then stabilized at 95. The patient had no appreciable abdominal pain, and therefore, acute pancreatitis was not confirmed as the diagnosis. With advancement of his diet, he had no complaints of abdominal pain.  The patient continued to improve symptomatically and clinically. He ambulated with nursing staff and with the assistance  of  the walker prior to discharge without  weakness or dizziness. His serum sodium stabilized at 130. His creatinine improved to 1.7 prior to discharge. He was noted to be anemic which was not new. A ferritin level and total iron levels were ordered prior to discharge. The results were pending.  The patient was discharged on 70/30 insulin twice a day for treatment of his diabetes. He was instructed to check his blood glucose at home at least 2-3 times daily. He voiced understanding. Further management  will be deferred to his primary care physician.  Procedures:  None  Consultations:  None  Discharge Exam: Filed Vitals:   12/12/12 1105 12/12/12 1347 12/12/12 2204 12/13/12 0535  BP: 167/93 133/58 143/54 132/60  Pulse: 96 98 77 75  Temp:  98.2 F (36.8 C) 97 F (36.1 C) 98.1 F (36.7 C)  TempSrc:      Resp:  18 20 20   Height:      Weight:    129.638 kg (285 lb 12.8 oz)  SpO2: 100% 100% 100% 100%    General: Pleasant large framed 66 year old African-American man laying in bed, in no acute distress. Cardiovascular: S1, S2, with a soft systolic murmur. Respiratory: Clear to auscultation bilaterally. Abdomen: Positive bowel sounds, soft, nontender, nondistended.  Discharge Instructions  Discharge Orders    Future Appointments: Provider: Department: Dept Phone: Center:   01/04/2013 2:45 PM Yehuda Savannah, MD [facility] [phone] [id number]     Future Orders Please Complete By Expires   Diet - low sodium heart healthy      Diet Carb Modified      Increase activity slowly      Discharge instructions      Comments:   Check your blood sugars at least twice a day once in the morning and once in the evening. Also check your blood sugars as needed if you start to feel jittery or shaky. Do not take insulin if your blood sugars is less than 120. Eat a low-fat carbohydrate modified diet. Avoid fried and greasy foods over the next few days.       Medication  List     As of 12/13/2012  2:28 PM    TAKE these medications         allopurinol 300 MG tablet   Commonly known as: ZYLOPRIM   Take 300 mg by mouth daily.      ALPRAZolam 1 MG tablet   Commonly known as: XANAX   Take 0.5-1 mg by mouth 3 (three) times daily as needed. For anxiety      amLODipine 10 MG tablet   Commonly known as: NORVASC   Take 1 tablet (10 mg total) by mouth daily.      aspirin 81 MG chewable tablet   Chew 81 mg by mouth daily.      captopril 50 MG tablet   Commonly known as: CAPOTEN   Take 50 mg by mouth 3 (three) times daily.      furosemide 40 MG tablet   Commonly known as: LASIX   Take 40 mg by mouth 2 (two) times daily.      HYDROcodone-acetaminophen 5-500 MG per tablet   Commonly known as: VICODIN   Take 1 tablet by mouth every 6 (six) hours as needed. For pain      insulin NPH-insulin regular (70-30) 100 UNIT/ML injection   Commonly known as: NOVOLIN 70/30   Inject 7 Units into the skin 2 (two) times daily with  a meal.      metoprolol 50 MG tablet   Commonly known as: LOPRESSOR   Take 1 tablet (50 mg total) by mouth 2 (two) times daily.      nitroGLYCERIN 0.4 MG SL tablet   Commonly known as: NITROSTAT   Place 1 tablet (0.4 mg total) under the tongue every 5 (five) minutes as needed for chest pain (up to 3 doses). WARNING: Do not take this medicine if you have taken Levitra within the past 24 hours.      omeprazole 20 MG capsule   Commonly known as: PRILOSEC   Take 1 capsule (20 mg total) by mouth daily. For  stomach acid reduction. take for one month. Over the and      ondansetron 8 MG tablet   Commonly known as: ZOFRAN   Take 8 mg by mouth every 8 (eight) hours as needed. For nausea      pravastatin 20 MG tablet   Commonly known as: PRAVACHOL   Take 20 mg by mouth daily.      Ticagrelor 90 MG Tabs tablet   Commonly known as: BRILINTA   Take 1 tablet (90 mg total) by mouth 2 (two) times daily.           Follow-up Information     Follow up with [name], MD. Schedule an appointment as soon as possible for a visit in 3 days. (Followup with Dr. [name] or his colleague in 3-5 days. Call for the appointment.)    Contact information:   [address] [place] [address] [phone]           The results of significant diagnostics from this hospitalization (including imaging, microbiology, ancillary and laboratory) are listed below for reference.    Significant Diagnostic Studies: Dg Chest Portable 1 View  11/14/2012  *RADIOLOGY REPORT*  Clinical Data: 66 year old male chest pain.  PORTABLE CHEST - 1 VIEW  Comparison: 02/15/2009.  Findings: Portable upright AP view 1949 hours.  Cardiomegaly not significantly changed. Other mediastinal contours are within normal limits.  Visualized tracheal air column is within normal limits. No pneumothorax, pleural effusion or consolidation.  Mild basilar crowding of lung markings and/or vascular congestion without overt edema.  IMPRESSION: Chronic cardiomegaly. Mild lung base atelectasis or vascular congestion.  No overt edema.   Original Report Authenticated By: Roselyn Reef, M.D.     Microbiology: No results found for this or any previous visit (from the past 240 hour(s)).   Labs: Basic Metabolic Panel:  Lab 01/00/71 0603 12/12/12 0606 12/11/12 0437 12/10/12 1630  NA 130* 132* 130* 124*  K 4.5 4.7 4.7 4.1  CL 99 99 95* 85*  CO2 23 23 23 23   GLUCOSE 208* 184* 201* 327*  BUN 49* 60* 86* 99*  CREATININE 1.69* 2.00* 2.55* 3.21*  CALCIUM 9.3 9.6 9.6 10.0  MG -- -- -- --  PHOS -- -- -- --   Liver Function Tests:  Lab 12/12/12 0606  AST 18  ALT 25  ALKPHOS 63  BILITOT 0.6  PROT 7.6  ALBUMIN 3.3*    Lab 12/13/12 0800 12/12/12 0606  LIPASE 95* 96*  AMYLASE -- --   No results found for this basename: AMMONIA:5 in the last 168 hours CBC:  Lab 12/12/12 0606 12/11/12 0437 12/10/12 2007  WBC 11.2* 11.6* 13.8*  NEUTROABS -- -- 12.4*   HGB 10.5* 10.3* 10.8*  HCT 30.9* 30.6* 31.7*  MCV 91.4 90.0 90.1  PLT 205  214 236   Cardiac Enzymes:  Lab 12/10/12 1630  CKTOTAL --  CKMB --  CKMBINDEX --  TROPONINI <0.30   BNP: BNP (last 3 results)  Basename 11/15/12 0015  PROBNP 86.0   CBG:  Lab 12/13/12 1116 12/13/12 0720 12/12/12 2055 12/12/12 1617 12/12/12 1101  GLUCAP 228* 188* 191* 217* 307*       Signed:  Talene Glastetter  [facility] 12/13/2012, 2:28 PM

## 2012-12-13 NOTE — Clinical Documentation Improvement (Signed)
MALNUTRITION DOCUMENTATION CLARIFICATION  THIS DOCUMENT IS NOT A PERMANENT PART OF THE MEDICAL RECORD  TO RESPOND TO THE THIS QUERY, FOLLOW THE INSTRUCTIONS BELOW:  1. If needed, update documentation for the patient's encounter via the notes activity.  2. Access this query again and click edit on the [facility] [employer].  3. After updating, or not, click F2 to complete all highlighted (required) fields concerning your review. Select "additional documentation in the medical record" OR "no additional documentation provided".  4. Click Sign note button.  5. The deficiency will fall out of your In Basket *Please let us know if you are not able to complete this workflow by phone or e-mail (listed below).  Please update your documentation within the medical record to reflect your response to this query.                                                                                        12/13/12   Dear Dr. Caryn Section / Associates,  In a better effort to capture your patient's severity of illness, reflect appropriate length of stay and utilization of resources, a review of the patient medical record has revealed the following indicators.  Based on your clinical judgment, please clarify and document in a progress note and/or discharge summary the clinical condition associated with the following supporting information: In responding to this query please exercise your independent judgment.  The fact that a query is asked, does not imply that any particular answer is desired or expected.  Please clarify nutritional status  Possible Clinical Conditions?  Mild Malnutrition  Moderate Malnutrition Severe Malnutrition   Protein Calorie Malnutrition Severe Protein Calorie Malnutrition Other Condition________________ Cannot clinically determine   Clinical Information:  Risk Factors: Nausea/Vomiting Per RD: "He meets criteria for severe malnutrition in the context of acute illness given his energy  intake </=50% for/= 5d and wt loss of > 7.5% / 3 months."  Signs & Symptoms: -Ht: 6'3" Wt: 276lbs -BMI: 34.59 -Weight Loss 18 lbs over 45 days  Treatments: -Nutrition Consult: INTERVENTION:  Add ProStat 30 ml TID with meals (300 kcal, 45 gr protein daily)  NUTRITION DIAGNOSIS: Inadequate oral intake related to nausea, vomiting as evidenced by multiple episodes of emesis PTA x 5 days.  Goal:Pt to meet >/= 90% of their estimated nutrition needs; not met Monitor: diet tolerance, meal intake wt trends and adequacy or oral intake   You may use possible, probable, or suspect with inpatient documentation. possible, probable, suspected diagnoses MUST be documented at the time of discharge  Reviewed: additional documentation in the medical record  Thank You,  Debora T Williams RN, MSN Clinical Documentation Specialist: Office# [phone] [place]

## 2012-12-15 ENCOUNTER — Encounter: Payer: Self-pay | Admitting: Orthopedic Surgery

## 2012-12-23 DIAGNOSIS — D369 Benign neoplasm, unspecified site: Secondary | ICD-10-CM

## 2012-12-23 HISTORY — DX: Benign neoplasm, unspecified site: D36.9

## 2012-12-24 ENCOUNTER — Telehealth: Payer: Self-pay | Admitting: *Deleted

## 2012-12-24 NOTE — Telephone Encounter (Signed)
[facility] to follow up on a referral we had sent for patient. Was advised by receptionist that patient did not want to schedule an appointment with them right now due to other health problems.

## 2012-12-31 ENCOUNTER — Other Ambulatory Visit: Payer: Self-pay | Admitting: *Deleted

## 2012-12-31 MED ORDER — TICAGRELOR 90 MG PO TABS: 90.0000 mg | ORAL_TABLET | Freq: Two times a day (BID) | ORAL | Status: DC

## 2013-01-01 ENCOUNTER — Telehealth: Payer: Self-pay | Admitting: Cardiology

## 2013-01-01 NOTE — Telephone Encounter (Signed)
Error/tg

## 2013-01-04 ENCOUNTER — Ambulatory Visit (INDEPENDENT_AMBULATORY_CARE_PROVIDER_SITE_OTHER): Payer: Medicare Other | Admitting: Cardiology

## 2013-01-04 ENCOUNTER — Encounter: Payer: Self-pay | Admitting: Cardiology

## 2013-01-04 VITALS — BP 150/88 | HR 71 | Ht 75.0 in | Wt 281.0 lb

## 2013-01-04 DIAGNOSIS — E119 Type 2 diabetes mellitus without complications: Secondary | ICD-10-CM

## 2013-01-04 DIAGNOSIS — I10 Essential (primary) hypertension: Secondary | ICD-10-CM

## 2013-01-04 DIAGNOSIS — I2510 Atherosclerotic heart disease of native coronary artery without angina pectoris: Secondary | ICD-10-CM

## 2013-01-04 DIAGNOSIS — R5383 Other fatigue: Secondary | ICD-10-CM

## 2013-01-04 DIAGNOSIS — N183 Chronic kidney disease, stage 3 unspecified: Secondary | ICD-10-CM

## 2013-01-04 DIAGNOSIS — E871 Hypo-osmolality and hyponatremia: Secondary | ICD-10-CM

## 2013-01-04 DIAGNOSIS — I7090 Unspecified atherosclerosis: Secondary | ICD-10-CM

## 2013-01-04 DIAGNOSIS — R5381 Other malaise: Secondary | ICD-10-CM

## 2013-01-04 MED ORDER — TICAGRELOR 90 MG PO TABS: 90.0000 mg | ORAL_TABLET | Freq: Two times a day (BID) | ORAL | Status: DC

## 2013-01-04 NOTE — Assessment & Plan Note (Signed)
Diabetes was exacerbated by stress of acute myocardial infarction. Additional assessment and management will be required by patient's PCP.

## 2013-01-04 NOTE — Assessment & Plan Note (Addendum)
Control of hypertension slightly suboptimal at this visit. Patient will monitor during usual activities and return multiple determinations to [place] at his next visit for further adjustment of medication.

## 2013-01-04 NOTE — Progress Notes (Signed)
Patient ID: [name], male   DOB: 1946-08-02, 67 y.o.   MRN: [id number]  HPI: Scheduled return visit for this very nice gentleman previously followed for hypertensive heart disease, but now seen following hospitalization for acute myocardial infarction requiring stent placement in the LAD and the LAD diagonal. No significant obstructive disease was present in the circumflex or the RCA, and overall LV systolic function was preserved. He developed acute on chronic renal failure following intervention. Since discharge, he has felt fine.  He has improved his diet and has not been a cigarette smoker. Lipid profile was excellent when assessed 6 weeks ago.  Prior to Admission medications   Medication Sig Start Date End Date Taking? Authorizing Provider  allopurinol (ZYLOPRIM) 300 MG tablet Take 300 mg by mouth daily.   Yes Historical Provider, MD  ALPRAZolam Duanne Moron) 1 MG tablet Take 0.5-1 mg by mouth 3 (three) times daily as needed. For anxiety   Yes Historical Provider, MD  amLODipine (NORVASC) 10 MG tablet Take 1 tablet (10 mg total) by mouth daily. 11/26/12  Yes Lendon Colonel, NP  aspirin 81 MG chewable tablet Chew 81 mg by mouth daily.   Yes Historical Provider, MD  captopril (CAPOTEN) 50 MG tablet Take 50 mg by mouth 3 (three) times daily.   Yes Historical Provider, MD  furosemide (LASIX) 40 MG tablet Take 40 mg by mouth 2 (two) times daily.    Yes Historical Provider, MD  HYDROcodone-acetaminophen (VICODIN) 5-500 MG per tablet Take 1 tablet by mouth every 6 (six) hours as needed. For pain   Yes Historical Provider, MD  metFORMIN (GLUCOPHAGE) 500 MG tablet Take 500 mg by mouth 2 (two) times daily with a meal.   Yes Historical Provider, MD  metoprolol (LOPRESSOR) 50 MG tablet Take 1 tablet (50 mg total) by mouth 2 (two) times daily. 11/17/12  Yes Dayna N Dunn, PA  nitroGLYCERIN (NITROSTAT) 0.4 MG SL tablet Place 1 tablet (0.4 mg total) under the tongue every 5 (five) minutes as needed for chest  pain (up to 3 doses). WARNING: Do not take this medicine if you have taken Levitra within the past 24 hours. 11/17/12  Yes Dayna N Dunn, PA  omeprazole (PRILOSEC) 20 MG capsule Take 1 capsule (20 mg total) by mouth daily. For  stomach acid reduction. take for one month. Over the and 12/13/12  Yes Rexene Alberts, MD  ondansetron (ZOFRAN) 8 MG tablet Take 8 mg by mouth every 8 (eight) hours as needed. For nausea   Yes Historical Provider, MD  pravastatin (PRAVACHOL) 20 MG tablet Take 20 mg by mouth daily.   Yes Historical Provider, MD  Ticagrelor (BRILINTA) 90 MG TABS tablet Take 1 tablet (90 mg total) by mouth 2 (two) times daily. 01/04/13  Yes Yehuda Savannah, MD   Allergies  Allergen Reactions  . Niacin And Related Other (See Comments)    Reaction: flushing and burning side effects even with extended release  Past medical history, social history, and family history reviewed and updated.  ROS: Denies chest pain, dyspnea, orthopnea, PND, lightheadedness or syncope. He is driving and shopping without difficulty and wishes to return to his exercise regime at the local [employer]. He is not interested in cardiac rehabilitation. All other systems reviewed and are negative.  PHYSICAL EXAM: BP 150/88  Pulse 71  Ht 6' 3"  (1.905 m)  Wt 127.461 kg (281 lb)  BMI 35.12 kg/m2  SpO2 98%  General-Well developed; no acute distress Body habitus-moderately overweight Neck-No JVD;  no carotid bruits Lungs-clear lung fields; resonant to percussion Cardiovascular-normal PMI; normal S1 and S2 Abdomen-normal bowel sounds; soft and non-tender without masses or organomegaly Musculoskeletal-No deformities, no cyanosis or clubbing Neurologic-Normal cranial nerves; symmetric strength and tone Skin-Warm, no significant lesions Extremities-distal pulses intact; 1+ edema; status post amputation of the right thumb  ASSESSMENT AND PLAN:  Jacqulyn Ducking, MD 01/04/2013 3:23 PM

## 2013-01-04 NOTE — Progress Notes (Deleted)
Name: [name]    DOB: 26-Sep-1946  Age: 67 y.o.  MR#: [id number]       PCP:  [name], MD      Insurance: @PAYORNAME @   CC:   No chief complaint on file.  1 - MEDICATION LIST AND CALL TO PHARMACY 2 - RECENT DX DIABETES 3 - ENROLLED IN PROGRAM FOR BRILINTA, HOWEVER PATIENT CAME IN LAST WEEK WHEN HE ONLY HAD A LIMITED SUPPLY      TO ADVISE HE WAS ABOUT OUT.  WILL SUPPLY WITH 2 WEEKS WORTH OF SAMPLES FROM THE [facility] OFFICE. 4 - STOOLS NOT RETURNED 5 - HOSPITAL 12/19 - MULTIPLE ISSUES   VS BP 150/88  Pulse 71  Ht 6' 3"  (1.905 m)  Wt 281 lb (127.461 kg)  BMI 35.12 kg/m2  SpO2 98%  Weights Current Weight  01/04/13 281 lb (127.461 kg)  12/13/12 285 lb 12.8 oz (129.638 kg)  11/24/12 288 lb (130.636 kg)    Blood Pressure  BP Readings from Last 3 Encounters:  01/04/13 150/88  12/13/12 132/60  11/24/12 94/60     Admit date:  (Not on file) Last encounter with RMR:  01/01/2013   Allergy Allergies  Allergen Reactions  . Niacin And Related Other (See Comments)    Reaction: flushing and burning side effects even with extended release    Current Outpatient Prescriptions  Medication Sig Dispense Refill  . allopurinol (ZYLOPRIM) 300 MG tablet Take 300 mg by mouth daily.      Marland Kitchen ALPRAZolam (XANAX) 1 MG tablet Take 0.5-1 mg by mouth 3 (three) times daily as needed. For anxiety      . amLODipine (NORVASC) 10 MG tablet Take 1 tablet (10 mg total) by mouth daily.  90 tablet  1  . aspirin 81 MG chewable tablet Chew 81 mg by mouth daily.      . captopril (CAPOTEN) 50 MG tablet Take 50 mg by mouth 3 (three) times daily.      . furosemide (LASIX) 40 MG tablet Take 40 mg by mouth 2 (two) times daily.       Marland Kitchen HYDROcodone-acetaminophen (VICODIN) 5-500 MG per tablet Take 1 tablet by mouth every 6 (six) hours as needed. For pain      . metFORMIN (GLUCOPHAGE) 500 MG tablet Take 500 mg by mouth 2 (two) times daily with a meal.      . metoprolol (LOPRESSOR) 50 MG tablet Take 1 tablet (50  mg total) by mouth 2 (two) times daily.  60 tablet  6  . nitroGLYCERIN (NITROSTAT) 0.4 MG SL tablet Place 1 tablet (0.4 mg total) under the tongue every 5 (five) minutes as needed for chest pain (up to 3 doses). WARNING: Do not take this medicine if you have taken Levitra within the past 24 hours.  25 tablet  4  . omeprazole (PRILOSEC) 20 MG capsule Take 1 capsule (20 mg total) by mouth daily. For  stomach acid reduction. take for one month. Over the and  30 capsule  0  . ondansetron (ZOFRAN) 8 MG tablet Take 8 mg by mouth every 8 (eight) hours as needed. For nausea      . pravastatin (PRAVACHOL) 20 MG tablet Take 20 mg by mouth daily.      . Ticagrelor (BRILINTA) 90 MG TABS tablet Take 1 tablet (90 mg total) by mouth 2 (two) times daily.  60 tablet  10    Discontinued Meds:    Medications Discontinued During This Encounter  Medication Reason  .  insulin NPH-insulin regular (NOVOLIN 70/30) (70-30) 100 UNIT/ML injection Discontinued by provider    Patient Active Problem List  Diagnosis  . Hypertension  . Chronic kidney disease, stage 3, mod decreased GFR  . NSTEMI (non-ST elevated myocardial infarction)  . Obstructive sleep apnea  . Obesity  . CAD S/P percutaneous coronary angioplasty  . Nausea and vomiting  . Dehydration  . Renal failure, acute on chronic  . Hyponatremia  . Diabetes mellitus    LABS Admission on 12/10/2012, Discharged on 12/13/2012  Component Date Value  . Sodium 12/10/2012 124*  . Potassium 12/10/2012 4.1   . Chloride 12/10/2012 85*  . CO2 12/10/2012 23   . Glucose, Bld 12/10/2012 327*  . BUN 12/10/2012 99*  . Creatinine, Ser 12/10/2012 3.21*  . Calcium 12/10/2012 10.0   . GFR calc non Af Amer 12/10/2012 19*  . GFR calc Af Amer 12/10/2012 21*  . Troponin I 12/10/2012 <0.30   . WBC 12/10/2012 13.8*  . RBC 12/10/2012 3.52*  . Hemoglobin 12/10/2012 10.8*  . HCT 12/10/2012 31.7*  . MCV 12/10/2012 90.1   . [facility] 12/10/2012 30.7   . MCHC 12/10/2012 34.1   .  RDW 12/10/2012 14.3   . Platelets 12/10/2012 236   . Neutrophils Relative 12/10/2012 90*  . Neutro Abs 12/10/2012 12.4*  . Lymphocytes Relative 12/10/2012 5*  . Lymphs Abs 12/10/2012 0.7   . Monocytes Relative 12/10/2012 4   . Monocytes Absolute 12/10/2012 0.6   . Eosinophils Relative 12/10/2012 1   . Eosinophils Absolute 12/10/2012 0.1   . Basophils Relative 12/10/2012 0   . Basophils Absolute 12/10/2012 0.0   . Sodium 12/11/2012 130*  . Potassium 12/11/2012 4.7   . Chloride 12/11/2012 95*  . CO2 12/11/2012 23   . Glucose, Bld 12/11/2012 201*  . BUN 12/11/2012 86*  . Creatinine, Ser 12/11/2012 2.55*  . Calcium 12/11/2012 9.6   . GFR calc non Af Amer 12/11/2012 24*  . GFR calc Af Amer 12/11/2012 28*  . WBC 12/11/2012 11.6*  . RBC 12/11/2012 3.40*  . Hemoglobin 12/11/2012 10.3*  . HCT 12/11/2012 30.6*  . MCV 12/11/2012 90.0   . [facility] 12/11/2012 30.3   . MCHC 12/11/2012 33.7   . RDW 12/11/2012 13.4   . Platelets 12/11/2012 214   . Glucose-Capillary 12/10/2012 244*  . Glucose-Capillary 12/11/2012 209*  . Comment 1 12/11/2012 Notify RN   . Comment 2 12/11/2012 Documented in Chart   . Glucose-Capillary 12/11/2012 268*  . Comment 1 12/11/2012 Notify RN   . Comment 2 12/11/2012 Documented in Chart   . Sodium 12/12/2012 132*  . Potassium 12/12/2012 4.7   . Chloride 12/12/2012 99   . CO2 12/12/2012 23   . Glucose, Bld 12/12/2012 184*  . BUN 12/12/2012 60*  . Creatinine, Ser 12/12/2012 2.00*  . Calcium 12/12/2012 9.6   . GFR calc non Af Amer 12/12/2012 33*  . GFR calc Af Amer 12/12/2012 38*  . WBC 12/12/2012 11.2*  . RBC 12/12/2012 3.38*  . Hemoglobin 12/12/2012 10.5*  . HCT 12/12/2012 30.9*  . MCV 12/12/2012 91.4   . [facility] 12/12/2012 31.1   . MCHC 12/12/2012 34.0   . RDW 12/12/2012 14.6   . Platelets 12/12/2012 205   . Total Protein 12/12/2012 7.6   . Albumin 12/12/2012 3.3*  . AST 12/12/2012 18   . ALT 12/12/2012 25   . Alkaline Phosphatase 12/12/2012 63   . Total  Bilirubin 12/12/2012 0.6   . Bilirubin, Direct 12/12/2012 0.2   .  Indirect Bilirubin 12/12/2012 0.4   . Lipase 12/12/2012 96*  . Glucose-Capillary 12/11/2012 304*  . Comment 1 12/11/2012 Notify RN   . Comment 2 12/11/2012 Documented in Chart   . Glucose-Capillary 12/11/2012 336*  . Glucose-Capillary 12/12/2012 182*  . Comment 1 12/12/2012 Notify RN   . Comment 2 12/12/2012 Documented in Chart   . Glucose-Capillary 12/12/2012 307*  . Comment 1 12/12/2012 Notify RN   . Comment 2 12/12/2012 Documented in Chart   . Glucose-Capillary 12/12/2012 217*  . Comment 1 12/12/2012 Notify RN   . Comment 2 12/12/2012 Documented in Chart   . Sodium 12/13/2012 130*  . Potassium 12/13/2012 4.5   . Chloride 12/13/2012 99   . CO2 12/13/2012 23   . Glucose, Bld 12/13/2012 208*  . BUN 12/13/2012 49*  . Creatinine, Ser 12/13/2012 1.69*  . Calcium 12/13/2012 9.3   . GFR calc non Af Amer 12/13/2012 40*  . GFR calc Af Amer 12/13/2012 47*  . Glucose-Capillary 12/12/2012 191*  . Lipase 12/13/2012 95*  . Glucose-Capillary 12/13/2012 188*  . Comment 1 12/13/2012 Notify RN   . Comment 2 12/13/2012 Documented in Chart   . Iron 12/11/2012 53   . TIBC 12/11/2012 230   . Saturation Ratios 12/11/2012 23   . UIBC 12/11/2012 177   . Ferritin 12/11/2012 1265*  . Glucose-Capillary 12/13/2012 228*  . Comment 1 12/13/2012 Notify RN   . Comment 2 12/13/2012 Documented in Chart   Office Visit on 11/24/2012  Component Date Value  . Sodium 11/24/2012 136   . Potassium 11/24/2012 4.7   . Chloride 11/24/2012 96   . CO2 11/24/2012 25   . Glucose, Bld 11/24/2012 137*  . BUN 11/24/2012 63*  . Creat 11/24/2012 2.69*  . Calcium 11/24/2012 9.8   . WBC 11/24/2012 8.7   . RBC 11/24/2012 3.57*  . Hemoglobin 11/24/2012 10.9*  . HCT 11/24/2012 33.8*  . MCV 11/24/2012 94.7   . [facility] 11/24/2012 30.5   . MCHC 11/24/2012 32.2   . RDW 11/24/2012 16.1*  . Platelets 11/24/2012 436*  Admission on 11/14/2012, Discharged on  11/17/2012  Component Date Value  . WBC 11/14/2012 8.6   . RBC 11/14/2012 3.52*  . Hemoglobin 11/14/2012 11.1*  . HCT 11/14/2012 33.8*  . MCV 11/14/2012 96.0   . [facility] 11/14/2012 31.5   . MCHC 11/14/2012 32.8   . RDW 11/14/2012 15.4   . Platelets 11/14/2012 218   . Troponin I 11/14/2012 <0.30   . Sodium 11/14/2012 138   . Potassium 11/14/2012 3.9   . Chloride 11/14/2012 101   . CO2 11/14/2012 24   . Glucose, Bld 11/14/2012 154*  . BUN 11/14/2012 24*  . Creatinine, Ser 11/14/2012 1.39*  . Calcium 11/14/2012 9.4   . GFR calc non Af Amer 11/14/2012 51*  . GFR calc Af Amer 11/14/2012 59*  . Troponin I 11/15/2012 4.68*  . Troponin I 11/15/2012 9.86*  . Troponin I 11/15/2012 9.02*  . TSH 11/15/2012 1.054   . Hemoglobin A1C 11/15/2012 6.0*  . Mean Plasma Glucose 11/15/2012 126*  . Pro B Natriuretic peptid* 11/15/2012 86.0   . Cholesterol 11/15/2012 121   . Triglycerides 11/15/2012 57   . HDL 11/15/2012 36*  . Total CHOL/HDL Ratio 11/15/2012 3.4   . VLDL 11/15/2012 11   . LDL Cholesterol 11/15/2012 74   . WBC 11/15/2012 7.2   . RBC 11/15/2012 3.14*  . Hemoglobin 11/15/2012 9.8*  . HCT 11/15/2012 30.1*  . MCV 11/15/2012 95.9   .  [facility] 11/15/2012 31.2   . MCHC 11/15/2012 32.6   . RDW 11/15/2012 15.2   . Platelets 11/15/2012 200   . Creatinine, Ser 11/15/2012 1.32   . GFR calc non Af Amer 11/15/2012 54*  . GFR calc Af Amer 11/15/2012 63*  . WBC 11/16/2012 6.4   . RBC 11/16/2012 3.16*  . Hemoglobin 11/16/2012 10.0*  . HCT 11/16/2012 30.0*  . MCV 11/16/2012 94.9   . [facility] 11/16/2012 31.6   . MCHC 11/16/2012 33.3   . RDW 11/16/2012 15.2   . Platelets 11/16/2012 214   . Prothrombin Time 11/16/2012 13.1   . INR 11/16/2012 1.00   . WBC 11/17/2012 7.6   . RBC 11/17/2012 3.21*  . Hemoglobin 11/17/2012 10.1*  . HCT 11/17/2012 30.5*  . MCV 11/17/2012 95.0   . [facility] 11/17/2012 31.5   . MCHC 11/17/2012 33.1   . RDW 11/17/2012 15.2   . Platelets 11/17/2012 219   . Sodium 11/17/2012  137   . Potassium 11/17/2012 3.6   . Chloride 11/17/2012 102   . CO2 11/17/2012 23   . Glucose, Bld 11/17/2012 111*  . BUN 11/17/2012 24*  . Creatinine, Ser 11/17/2012 1.53*  . Calcium 11/17/2012 9.1   . GFR calc non Af Amer 11/17/2012 45*  . GFR calc Af Amer 11/17/2012 53*  . Activated Clotting Time 11/16/2012 389      Results for this Opt Visit:     Results for orders placed during the hospital encounter of 64/40/34  BASIC METABOLIC PANEL      Component Value Range   Sodium 124 (*) 135 - 145 mEq/L   Potassium 4.1  3.5 - 5.1 mEq/L   Chloride 85 (*) 96 - 112 mEq/L   CO2 23  19 - 32 mEq/L   Glucose, Bld 327 (*) 70 - 99 mg/dL   BUN 99 (*) 6 - 23 mg/dL   Creatinine, Ser 3.21 (*) 0.50 - 1.35 mg/dL   Calcium 10.0  8.4 - 10.5 mg/dL   GFR calc non Af Amer 19 (*) >90 mL/min   GFR calc Af Amer 21 (*) >90 mL/min  TROPONIN I      Component Value Range   Troponin I <0.30  <0.30 ng/mL  CBC WITH DIFFERENTIAL      Component Value Range   WBC 13.8 (*) 4.0 - 10.5 K/uL   RBC 3.52 (*) 4.22 - 5.81 MIL/uL   Hemoglobin 10.8 (*) 13.0 - 17.0 g/dL   HCT 31.7 (*) 39.0 - 52.0 %   MCV 90.1  78.0 - 100.0 fL   MCH 30.7  26.0 - 34.0 pg   MCHC 34.1  30.0 - 36.0 g/dL   RDW 14.3  11.5 - 15.5 %   Platelets 236  150 - 400 K/uL   Neutrophils Relative 90 (*) 43 - 77 %   Neutro Abs 12.4 (*) 1.7 - 7.7 K/uL   Lymphocytes Relative 5 (*) 12 - 46 %   Lymphs Abs 0.7  0.7 - 4.0 K/uL   Monocytes Relative 4  3 - 12 %   Monocytes Absolute 0.6  0.1 - 1.0 K/uL   Eosinophils Relative 1  0 - 5 %   Eosinophils Absolute 0.1  0.0 - 0.7 K/uL   Basophils Relative 0  0 - 1 %   Basophils Absolute 0.0  0.0 - 0.1 K/uL  BASIC METABOLIC PANEL      Component Value Range   Sodium 130 (*) 135 - 145 mEq/L   Potassium  4.7  3.5 - 5.1 mEq/L   Chloride 95 (*) 96 - 112 mEq/L   CO2 23  19 - 32 mEq/L   Glucose, Bld 201 (*) 70 - 99 mg/dL   BUN 86 (*) 6 - 23 mg/dL   Creatinine, Ser 2.55 (*) 0.50 - 1.35 mg/dL   Calcium 9.6  8.4 -  10.5 mg/dL   GFR calc non Af Amer 24 (*) >90 mL/min   GFR calc Af Amer 28 (*) >90 mL/min  CBC      Component Value Range   WBC 11.6 (*) 4.0 - 10.5 K/uL   RBC 3.40 (*) 4.22 - 5.81 MIL/uL   Hemoglobin 10.3 (*) 13.0 - 17.0 g/dL   HCT 30.6 (*) 39.0 - 52.0 %   MCV 90.0  78.0 - 100.0 fL   MCH 30.3  26.0 - 34.0 pg   MCHC 33.7  30.0 - 36.0 g/dL   RDW 13.4  11.5 - 15.5 %   Platelets 214  150 - 400 K/uL  GLUCOSE, CAPILLARY      Component Value Range   Glucose-Capillary 244 (*) 70 - 99 mg/dL  GLUCOSE, CAPILLARY      Component Value Range   Glucose-Capillary 209 (*) 70 - 99 mg/dL   Comment 1 Notify RN     Comment 2 Documented in Chart    GLUCOSE, CAPILLARY      Component Value Range   Glucose-Capillary 268 (*) 70 - 99 mg/dL   Comment 1 Notify RN     Comment 2 Documented in Chart    BASIC METABOLIC PANEL      Component Value Range   Sodium 132 (*) 135 - 145 mEq/L   Potassium 4.7  3.5 - 5.1 mEq/L   Chloride 99  96 - 112 mEq/L   CO2 23  19 - 32 mEq/L   Glucose, Bld 184 (*) 70 - 99 mg/dL   BUN 60 (*) 6 - 23 mg/dL   Creatinine, Ser 2.00 (*) 0.50 - 1.35 mg/dL   Calcium 9.6  8.4 - 10.5 mg/dL   GFR calc non Af Amer 33 (*) >90 mL/min   GFR calc Af Amer 38 (*) >90 mL/min  CBC      Component Value Range   WBC 11.2 (*) 4.0 - 10.5 K/uL   RBC 3.38 (*) 4.22 - 5.81 MIL/uL   Hemoglobin 10.5 (*) 13.0 - 17.0 g/dL   HCT 30.9 (*) 39.0 - 52.0 %   MCV 91.4  78.0 - 100.0 fL   MCH 31.1  26.0 - 34.0 pg   MCHC 34.0  30.0 - 36.0 g/dL   RDW 14.6  11.5 - 15.5 %   Platelets 205  150 - 400 K/uL  HEPATIC FUNCTION PANEL      Component Value Range   Total Protein 7.6  6.0 - 8.3 g/dL   Albumin 3.3 (*) 3.5 - 5.2 g/dL   AST 18  0 - 37 U/L   ALT 25  0 - 53 U/L   Alkaline Phosphatase 63  39 - 117 U/L   Total Bilirubin 0.6  0.3 - 1.2 mg/dL   Bilirubin, Direct 0.2  0.0 - 0.3 mg/dL   Indirect Bilirubin 0.4  0.3 - 0.9 mg/dL  LIPASE, BLOOD      Component Value Range   Lipase 96 (*) 11 - 59 U/L  GLUCOSE,  CAPILLARY      Component Value Range   Glucose-Capillary 304 (*) 70 - 99 mg/dL   Comment  1 Notify RN     Comment 2 Documented in Chart    GLUCOSE, CAPILLARY      Component Value Range   Glucose-Capillary 336 (*) 70 - 99 mg/dL  GLUCOSE, CAPILLARY      Component Value Range   Glucose-Capillary 182 (*) 70 - 99 mg/dL   Comment 1 Notify RN     Comment 2 Documented in Chart    GLUCOSE, CAPILLARY      Component Value Range   Glucose-Capillary 307 (*) 70 - 99 mg/dL   Comment 1 Notify RN     Comment 2 Documented in Chart    GLUCOSE, CAPILLARY      Component Value Range   Glucose-Capillary 217 (*) 70 - 99 mg/dL   Comment 1 Notify RN     Comment 2 Documented in Chart    BASIC METABOLIC PANEL      Component Value Range   Sodium 130 (*) 135 - 145 mEq/L   Potassium 4.5  3.5 - 5.1 mEq/L   Chloride 99  96 - 112 mEq/L   CO2 23  19 - 32 mEq/L   Glucose, Bld 208 (*) 70 - 99 mg/dL   BUN 49 (*) 6 - 23 mg/dL   Creatinine, Ser 1.69 (*) 0.50 - 1.35 mg/dL   Calcium 9.3  8.4 - 10.5 mg/dL   GFR calc non Af Amer 40 (*) >90 mL/min   GFR calc Af Amer 47 (*) >90 mL/min  GLUCOSE, CAPILLARY      Component Value Range   Glucose-Capillary 191 (*) 70 - 99 mg/dL  LIPASE, BLOOD      Component Value Range   Lipase 95 (*) 11 - 59 U/L  GLUCOSE, CAPILLARY      Component Value Range   Glucose-Capillary 188 (*) 70 - 99 mg/dL   Comment 1 Notify RN     Comment 2 Documented in Chart    IRON AND TIBC      Component Value Range   Iron 53  42 - 135 ug/dL   TIBC 230  215 - 435 ug/dL   Saturation Ratios 23  20 - 55 %   UIBC 177  125 - 400 ug/dL  FERRITIN      Component Value Range   Ferritin 1265 (*) 22 - 322 ng/mL  GLUCOSE, CAPILLARY      Component Value Range   Glucose-Capillary 228 (*) 70 - 99 mg/dL   Comment 1 Notify RN     Comment 2 Documented in Chart      EKG Orders placed during the hospital encounter of 12/10/12  . ED EKG  . ED EKG  . EKG 12-LEAD  . EKG 12-LEAD  . EKG     Prior Assessment  and Plan Problem List as of 01/04/2013            Cardiology Problems   Hypertension   Last Assessment & Plan Note   11/24/2012 Office Visit Signed 11/24/2012  3:07 PM by Lendon Colonel, NP    Pressure is currently soft on this evaluation and 94/60. He continues on Lasix 40 mg 3 times a day. Along with metoprolol 50 mg twice a day. We will review labs and may decrease the Lasix to twice a day. He also continues on captopril 50 mg 3 times a day as well.  Medication adjustments may need to be completed on followup once labs are evaluated. Do not want him to have issues with hypotension with a normal LV  function.    NSTEMI (non-ST elevated myocardial infarction)   CAD S/P percutaneous coronary angioplasty   Last Assessment & Plan Note   11/24/2012 Office Visit Signed 11/24/2012  3:04 PM by Lendon Colonel, NP    The patient is without complaint of chest pain. He is tolerating his medications with the exception of the [place] for which he complains of bleeding. I have advised him that we can change him to Plavix, however he wishes to continue on the Brilinta as he is able to get samples of this to save him money. I will have a CBC, and stool Hemoccult cards provided to him. He has a history of normocytic anemia, but with his complaints of bleeding issues, I would like to evaluate his status. A BMET is also ordered to evaluate kidney function. I discharge creatinine was 1.53 , hemoglobin was 10.1. I will see him again in one month. He is referred to cardiac rehabilitation for continued strengthening and education.      Other   Chronic kidney disease, stage 3, mod decreased GFR   Last Assessment & Plan Note   11/24/2012 Office Visit Signed 11/24/2012  3:05 PM by Lendon Colonel, NP    He is suffering from gout symptoms in his knees and is requesting medication for relief. Primary has had them on allopurinol. He is due to see his primary care physician in 3 days. I have advised him to make sure he  keeps that appointment for adjustment in medications or changes to help he is pain and symptoms. BMET is ordered.    Obstructive sleep apnea   Obesity   Nausea and vomiting   Dehydration   Renal failure, acute on chronic   Hyponatremia   Diabetes mellitus       Imaging: No results found.   FRS Calculation: Score not calculated. Missing: Total Cholesterol

## 2013-01-04 NOTE — Patient Instructions (Addendum)
Your physician recommends that you schedule a follow-up appointment in: [place] physician recommends that you return for lab work in: [place] has requested that you regularly monitor and record your blood pressure readings at home. Please use the same machine at the same time of day to check your readings and record them to bring to your follow-up visit.

## 2013-01-04 NOTE — Assessment & Plan Note (Signed)
Asymptomatic from cardiovascular standpoint 2 months following acute myocardial infarction. He plans to return to his exercise program at a local gym and does not wish to participate in cardiac rehabilitation. Optimal control of cardiovascular risk factors will be sought.

## 2013-01-04 NOTE — Assessment & Plan Note (Addendum)
Sodium of 130 in 10/2012 following myocardial infarction with concomitant moderate hyperglycemia.  Electrolytes will be monitored.

## 2013-01-04 NOTE — Assessment & Plan Note (Signed)
Renal function improved relative to 2 years ago, but still moderately impaired. Electrolytes and BUN/creatinine will be monitored.

## 2013-01-05 ENCOUNTER — Encounter (HOSPITAL_COMMUNITY): Payer: Medicare Other

## 2013-01-07 ENCOUNTER — Other Ambulatory Visit: Payer: Self-pay | Admitting: Cardiology

## 2013-01-07 ENCOUNTER — Encounter (HOSPITAL_COMMUNITY): Payer: Medicare Other

## 2013-01-08 LAB — TSH: TSH: 0.407 u[IU]/mL (ref 0.350–4.500)

## 2013-01-08 LAB — COMPREHENSIVE METABOLIC PANEL
ALT: 11 U/L (ref 0–53)
AST: 18 U/L (ref 0–37)
Albumin: 3.9 g/dL (ref 3.5–5.2)
Alkaline Phosphatase: 77 U/L (ref 39–117)
BUN: 33 mg/dL — ABNORMAL HIGH (ref 6–23)
Potassium: 4.1 mEq/L (ref 3.5–5.3)
Sodium: 142 mEq/L (ref 135–145)

## 2013-01-08 LAB — CBC
Hemoglobin: 10.9 g/dL — ABNORMAL LOW (ref 13.0–17.0)
MCH: 31.4 pg (ref 26.0–34.0)
MCHC: 33.1 g/dL (ref 30.0–36.0)
RDW: 18.8 % — ABNORMAL HIGH (ref 11.5–15.5)

## 2013-01-12 ENCOUNTER — Encounter: Payer: Self-pay | Admitting: Cardiology

## 2013-01-12 DIAGNOSIS — D649 Anemia, unspecified: Secondary | ICD-10-CM | POA: Insufficient documentation

## 2013-01-14 ENCOUNTER — Encounter: Payer: Self-pay | Admitting: *Deleted

## 2013-02-08 ENCOUNTER — Ambulatory Visit (INDEPENDENT_AMBULATORY_CARE_PROVIDER_SITE_OTHER): Payer: Medicare Other | Admitting: Cardiology

## 2013-02-08 ENCOUNTER — Ambulatory Visit: Payer: Medicare Other | Admitting: Cardiology

## 2013-02-08 ENCOUNTER — Encounter: Payer: Self-pay | Admitting: Cardiology

## 2013-02-08 VITALS — BP 122/70 | HR 70 | Ht 75.0 in | Wt 288.0 lb

## 2013-02-08 DIAGNOSIS — N183 Chronic kidney disease, stage 3 unspecified: Secondary | ICD-10-CM

## 2013-02-08 DIAGNOSIS — E1310 Other specified diabetes mellitus with ketoacidosis without coma: Secondary | ICD-10-CM

## 2013-02-08 DIAGNOSIS — E119 Type 2 diabetes mellitus without complications: Secondary | ICD-10-CM

## 2013-02-08 DIAGNOSIS — I10 Essential (primary) hypertension: Secondary | ICD-10-CM

## 2013-02-08 DIAGNOSIS — I2510 Atherosclerotic heart disease of native coronary artery without angina pectoris: Secondary | ICD-10-CM

## 2013-02-08 DIAGNOSIS — E1110 Type 2 diabetes mellitus with ketoacidosis without coma: Secondary | ICD-10-CM

## 2013-02-08 DIAGNOSIS — D649 Anemia, unspecified: Secondary | ICD-10-CM

## 2013-02-08 DIAGNOSIS — I7090 Unspecified atherosclerosis: Secondary | ICD-10-CM

## 2013-02-08 MED ORDER — FUROSEMIDE 40 MG PO TABS: 60.0000 mg | ORAL_TABLET | Freq: Every day | ORAL | Status: DC

## 2013-02-08 NOTE — Progress Notes (Signed)
Patient ID: [name], male   DOB: 02/23/1946, 67 y.o.   MRN: [id number]  HPI: Schedule return visit for this very nice gentleman with long-standing hypertension, hypertensive heart disease and recently diagnosed coronary artery disease.  Since percutaneous intervention in the LAD and first diagonal with drug-eluting stents 3 months ago, he has done quite well. He denies all cardiovascular symptoms. He has resumed exercise at a local gym without difficulty. His only complaint is polyuria related to diuretic usage, which he has not been taking as directed. Current Outpatient Prescriptions  Medication Sig Dispense Refill  . allopurinol (ZYLOPRIM) 300 MG tablet Take 300 mg by mouth daily.      Marland Kitchen ALPRAZolam (XANAX) 1 MG tablet Take 0.5-1 mg by mouth 3 (three) times daily as needed. For anxiety      . aspirin 81 MG chewable tablet Chew 81 mg by mouth daily.      . furosemide (LASIX) 40 MG tablet Take 1.5 tablets (60 mg total) by mouth daily.  45 tablet  3  . HYDROcodone-acetaminophen (VICODIN) 5-500 MG per tablet Take 1 tablet by mouth every 6 (six) hours as needed. For pain      . metoprolol (LOPRESSOR) 50 MG tablet Take 1 tablet (50 mg total) by mouth 2 (two) times daily.  60 tablet  6  . nitroGLYCERIN (NITROSTAT) 0.4 MG SL tablet Place 1 tablet (0.4 mg total) under the tongue every 5 (five) minutes as needed for chest pain (up to 3 doses). WARNING: Do not take this medicine if you have taken Levitra within the past 24 hours.  25 tablet  4  . pravastatin (PRAVACHOL) 20 MG tablet Take 20 mg by mouth daily.      . Ticagrelor (BRILINTA) 90 MG TABS tablet Take 1 tablet (90 mg total) by mouth 2 (two) times daily.  32 tablet  0   No current facility-administered medications for this visit.   Allergies  Allergen Reactions  . Niacin And Related Other (See Comments)    Reaction: flushing and burning side effects even with extended release     Past medical history, social history, and family history  reviewed and updated.  ROS: Denies chest pain, dyspnea, orthopnea, palpitations, lightheadedness or syncope. He has substantial peripheral edema only partially controlled with diuretics.  PHYSICAL EXAM: BP 122/70  Pulse 70  Ht 6' 3"  (1.905 m)  Wt 130.636 kg (288 lb)  BMI 36 kg/m2;  Body mass index is 36 kg/(m^2). General-Well developed; no acute distress Body habitus-mildly to moderately overweight Neck-No JVD; no carotid bruits Lungs-clear lung fields; resonant to percussion Cardiovascular-normal PMI; normal S1 and S2; regular rhythm Abdomen-normal bowel sounds; soft and non-tender without masses or organomegaly Musculoskeletal-No cyanosis or clubbing; deformities of the MCP joints; status post amputation of the left thumb Neurologic-Normal cranial nerves; symmetric strength and tone Skin-Warm, no significant lesions Extremities-distal pulses intact; 2+ edema to the mid calf  Jacqulyn Ducking, MD 02/08/2013  4:38 PM  ASSESSMENT AND PLAN

## 2013-02-08 NOTE — Progress Notes (Deleted)
Name: [name]    DOB: [birth year]  Age: 67 y.o.  MR#: [id number]       PCP:  [name], MD      Insurance: Payor: [name] MEDICARE  Plan: [place]  Product Type: *No Product type*    CC:   No chief complaint on file.  MEDICATION LIST  VS Filed Vitals:   02/08/13 1523  BP: 122/70  Pulse: 70  Height: 6' 3"  (1.905 m)  Weight: 288 lb (130.636 kg)    Weights Current Weight  02/08/13 288 lb (130.636 kg)  01/04/13 281 lb (127.461 kg)  12/13/12 285 lb 12.8 oz (129.638 kg)    Blood Pressure  BP Readings from Last 3 Encounters:  02/08/13 122/70  01/04/13 150/88  12/13/12 132/60     Admit date:  (Not on file) Last encounter with RMR:  02/08/2013   Allergy Niacin and related  Current Outpatient Prescriptions  Medication Sig Dispense Refill  . allopurinol (ZYLOPRIM) 300 MG tablet Take 300 mg by mouth daily.      Marland Kitchen ALPRAZolam (XANAX) 1 MG tablet Take 0.5-1 mg by mouth 3 (three) times daily as needed. For anxiety      . aspirin 81 MG chewable tablet Chew 81 mg by mouth daily.      . furosemide (LASIX) 40 MG tablet Take 40 mg by mouth daily.       Marland Kitchen HYDROcodone-acetaminophen (VICODIN) 5-500 MG per tablet Take 1 tablet by mouth every 6 (six) hours as needed. For pain      . metoprolol (LOPRESSOR) 50 MG tablet Take 1 tablet (50 mg total) by mouth 2 (two) times daily.  60 tablet  6  . nitroGLYCERIN (NITROSTAT) 0.4 MG SL tablet Place 1 tablet (0.4 mg total) under the tongue every 5 (five) minutes as needed for chest pain (up to 3 doses). WARNING: Do not take this medicine if you have taken Levitra within the past 24 hours.  25 tablet  4  . pravastatin (PRAVACHOL) 20 MG tablet Take 20 mg by mouth daily.      . Ticagrelor (BRILINTA) 90 MG TABS tablet Take 1 tablet (90 mg total) by mouth 2 (two) times daily.  32 tablet  0   No current facility-administered medications for this visit.    Discontinued Meds:    Medications Discontinued During This  Encounter  Medication Reason  . amLODipine (NORVASC) 10 MG tablet Error  . captopril (CAPOTEN) 50 MG tablet Error  . metFORMIN (GLUCOPHAGE) 500 MG tablet Error  . omeprazole (PRILOSEC) 20 MG capsule Error  . ondansetron (ZOFRAN) 8 MG tablet Error    Patient Active Problem List  Diagnosis  . Hypertension  . Chronic kidney disease, stage 3, mod decreased GFR  . Obstructive sleep apnea  . Overweight  . Arteriosclerotic cardiovascular disease (ASCVD)  . Hyponatremia  . Diabetes mellitus  . Anemia, normocytic normochromic    LABS @BMET3 @  @CMPRESULT3 @ @CBC3 @  Lipid Panel     Component Value Date/Time   CHOL 121 11/15/2012 0625   TRIG 57 11/15/2012 0625   HDL 36* 11/15/2012 0625   CHOLHDL 3.4 11/15/2012 0625   VLDL 11 11/15/2012 0625   LDLCALC 74 11/15/2012 0625    ABG No results found for this basename: phart, pco2, pco2art, po2, po2art, hco3, tco2, acidbasedef, o2sat     BNP (last 3 results)  Recent Labs  11/15/12 0015  PROBNP 86.0   Cardiac Panel (last 3 results) No results found for this  basename: CKTOTAL, CKMB, TROPONINI, RELINDX,  in the last 72 hours  Iron/TIBC/Ferritin    Component Value Date/Time   IRON 53 12/11/2012 0530   TIBC 230 12/11/2012 0530   FERRITIN 1265* 12/11/2012 0530     EKG Orders placed during the hospital encounter of 12/10/12  . ED EKG  . ED EKG  . EKG 12-LEAD  . EKG 12-LEAD  . EKG     Prior Assessment and Plan Problem List as of 02/08/2013     ICD-9-CM     Cardiology Problems   Hypertension   Last Assessment & Plan   01/04/2013 Office Visit Edited 01/04/2013 10:28 PM by Yehuda Savannah, MD     Control of hypertension slightly suboptimal at this visit. Patient will monitor during usual activities and return multiple determinations to [place] at his next visit for further adjustment of medication.    Arteriosclerotic cardiovascular disease (ASCVD)   Last Assessment & Plan   01/04/2013 Office Visit Written 01/04/2013 10:18 PM by  Yehuda Savannah, MD     Asymptomatic from cardiovascular standpoint 2 months following acute myocardial infarction. He plans to return to his exercise program at a local gym and does not wish to participate in cardiac rehabilitation. Optimal control of cardiovascular risk factors will be sought.      Other   Chronic kidney disease, stage 3, mod decreased GFR   Last Assessment & Plan   01/04/2013 Office Visit Written 01/04/2013 10:21 PM by Yehuda Savannah, MD     Renal function improved relative to 2 years ago, but still moderately impaired. Electrolytes and BUN/creatinine will be monitored.    Obstructive sleep apnea   Overweight   Hyponatremia   Last Assessment & Plan   01/04/2013 Office Visit Edited 01/04/2013 10:24 PM by Yehuda Savannah, MD     Sodium of 130 in 10/2012 following myocardial infarction with concomitant moderate hyperglycemia.  Electrolytes will be monitored.    Diabetes mellitus   Last Assessment & Plan   01/04/2013 Office Visit Written 01/04/2013 10:22 PM by Yehuda Savannah, MD     Diabetes was exacerbated by stress of acute myocardial infarction. Additional assessment and management will be required by patient's PCP.    Anemia, normocytic normochromic       Imaging: No results found.

## 2013-02-08 NOTE — Assessment & Plan Note (Addendum)
Long-standing moderate anemia. Iron studies in the past did not suggest iron deficiency; in fact, there is a substantial elevation of ferritin. Renal dysfunction may be contributing, but the magnitude of anemia is out of proportion to kidney disease. Referral to Hematology advised.

## 2013-02-08 NOTE — Assessment & Plan Note (Signed)
Stable clinical course since two-vessel intervention. Optimal medical therapy will be continued.

## 2013-02-08 NOTE — Assessment & Plan Note (Addendum)
Patient denies that he has diabetes, believing that previous elevations in CBGs and hemoglobin A1c have been entirely related to physiologic stress. He has decreased his dose of metformin unilaterally, but reports blood glucose values typically less than 120. A repeat hemoglobin A1c level will be obtained.

## 2013-02-08 NOTE — Patient Instructions (Addendum)
Your physician recommends that you schedule a follow-up appointment in: [place] physician has recommended you make the following change in your medication:   1) INCREASE LASIX TO 60MG EVERY DAY  Your physician recommends that you return for lab work in: [place] physician recommends that you have follow up lab work, we will mail you a reminder letter to alert you when to go [employer], located across the street from our office.   A REFERRAL HAS BEEN PLACED IN YOUR CHART FOR MD NEIJSTROM DUE TO ANEMIA, SOMEONE WILL CALL YOU WITH THAT APT DATE AND TIME

## 2013-02-08 NOTE — Assessment & Plan Note (Signed)
BUN and creatinine have improved substantially since 2011. Values are minimally prerenal, probably related to diuretics, but current levels are entirely acceptable.

## 2013-02-08 NOTE — Assessment & Plan Note (Signed)
Blood pressure is much better controlled than it typically has been in the past. Current medication will be continued.

## 2013-02-19 ENCOUNTER — Other Ambulatory Visit: Payer: Self-pay | Admitting: *Deleted

## 2013-02-19 DIAGNOSIS — I10 Essential (primary) hypertension: Secondary | ICD-10-CM

## 2013-02-19 DIAGNOSIS — D649 Anemia, unspecified: Secondary | ICD-10-CM

## 2013-02-19 DIAGNOSIS — E1110 Type 2 diabetes mellitus with ketoacidosis without coma: Secondary | ICD-10-CM

## 2013-03-01 ENCOUNTER — Ambulatory Visit (HOSPITAL_COMMUNITY): Payer: Medicare Other | Admitting: Oncology

## 2013-03-10 ENCOUNTER — Encounter (HOSPITAL_COMMUNITY): Payer: Medicare Other | Attending: Oncology | Admitting: Oncology

## 2013-03-10 ENCOUNTER — Encounter (HOSPITAL_COMMUNITY): Payer: Self-pay | Admitting: Oncology

## 2013-03-10 VITALS — BP 147/80 | HR 62 | Temp 98.1°F | Resp 18 | Ht 74.0 in | Wt 289.0 lb

## 2013-03-10 DIAGNOSIS — N183 Chronic kidney disease, stage 3 unspecified: Secondary | ICD-10-CM

## 2013-03-10 DIAGNOSIS — E119 Type 2 diabetes mellitus without complications: Secondary | ICD-10-CM | POA: Insufficient documentation

## 2013-03-10 DIAGNOSIS — I129 Hypertensive chronic kidney disease with stage 1 through stage 4 chronic kidney disease, or unspecified chronic kidney disease: Secondary | ICD-10-CM | POA: Insufficient documentation

## 2013-03-10 DIAGNOSIS — D649 Anemia, unspecified: Secondary | ICD-10-CM | POA: Insufficient documentation

## 2013-03-10 NOTE — Patient Instructions (Addendum)
[facility] Discharge Instructions  RECOMMENDATIONS MADE BY THE CONSULTANT AND ANY TEST RESULTS WILL BE SENT TO YOUR REFERRING PHYSICIAN.  EXAM FINDINGS BY THE PHYSICIAN TODAY AND SIGNS OR SYMPTOMS TO REPORT TO CLINIC OR PRIMARY PHYSICIAN: exam and discussion by PA.  Will be making a referral to Dr. Charlestine Night for evaluation of the nodules on your hands.  We will check some labs today and will let you know results when they are available.  MEDICATIONS PRESCRIBED:  none    SPECIAL INSTRUCTIONS/FOLLOW-UP: Return in 1 year.  Thank you for choosing [facility] to provide your oncology and hematology care.  To afford each patient quality time with our providers, please arrive at least 15 minutes before your scheduled appointment time.  With your help, our goal is to use those 15 minutes to complete the necessary work-up to ensure our physicians have the information they need to help with your evaluation and healthcare recommendations.    Effective January 1st, 2014, we ask that you re-schedule your appointment with our physicians should you arrive 10 or more minutes late for your appointment.  We strive to give you quality time with our providers, and arriving late affects you and other patients whose appointments are after yours.    Again, thank you for choosing [facility].  Our hope is that these requests will decrease the amount of time that you wait before being seen by our physicians.       _____________________________________________________________  Should you have questions after your visit to [facility], please contact our office at [phone] between the hours of 8:30 a.m. and 5:00 p.m.  Voicemails left after 4:30 p.m. will not be returned until the following business day.  For prescription refill requests, have your pharmacy contact our office with your prescription refill request.

## 2013-03-10 NOTE — Progress Notes (Signed)
[facility] NEW PATIENT EVALUATION   Name: [name] Date: 03/10/2013 MRN: [id number] DOB: [birth year]    CC: [name] Grills, MD     DIAGNOSIS: The encounter diagnosis was Anemia, normocytic normochromic.   HISTORY OF PRESENT ILLNESS:[name] is a 67 y.o. African American male who is referred to the [facility] by way of his Cardiologist, Dr. Lattie [name], for normocytic anemia.  He has a past medical history significant for type 2 diabetes, chronic renal disease (stage 3), HTN, gout, and NSTEMI in November 2013.  [name] reports that he has been a borderline dabetic for some time, but he came down the the influenza virus, requiring admission to the hospital and at that time he was diagnosed with type 2 diabetes.  He reports that his "kidneys are great," but on further evaluation, he has stage 3 renal disease.  He does take medications for HTN as well.  He reports that he works out with aerobic exercises at [employer].  He used to lift weights, but he does not do that any more due to his back.  He reports today that he is asymptomatic from the standpoint of his anemia.  He denies any fatigue, dyspnea on exertion, SOB, chest pain, tiredness.  Hematologically he denies any complaints and ROS questioning is negative.  He denies any headaches, dizziness, double vision, fevers, chills, night sweats, nausea, vomiting, diarrhea, constipation, abdominal pain, chest pain, heart palpitations, blood in stool, black tarry stool, urinary pain/burning/frequency, hematuria.   I personally reviewed and went over laboratory results with the patient.  We discussed his anemia which is mild.  He reports, "It is only mild? Man, get me Leretha Dykes here!  I don't need to be here then!  I hardly slept last night worrying about this!"  His iron studies display an anemia of chronic disease picture.  A Vitamin B 12 level and Folate level are missing from his chart so I will perform that  today for completeness sake.  His renal function has improved, but he certainly is not perfect from a renal standpoint.  His Hgb A1c was 7.7 and indicative of poorly controlled diabetes.  With his mild anemia and being asymptomatic, there is no indication to treat the anemia at this time.    FAMILY HISTORY: family history includes Cancer in his other; Diabetes in an unspecified family member; Heart disease in an unspecified family member; and Hypertension in his other. Mother 39 deceased due to renal disease Father 60 deceased from lung cancer, he was a smoker Sister 59 deceased from unknown reason but he thinks she OD on pain medication Brother 67 and alive with multiple chronic diseases Brother 76 and alive and healthy Brother 17 alive with multiple chronic diseases Daughter 22 with a few chronic diseases.    PAST MEDICAL HISTORY:  has a past medical history of Diastolic heart failure; Hypertension; NSVT (nonsustained ventricular tachycardia); Tobacco abuse, in remission; Obesity; Degenerative joint disease; Benign prostatic hypertrophy; Erectile dysfunction; High cholesterol; Obstructive sleep apnea; CAD (coronary artery disease); Chronic kidney disease, stage 3, mod decreased GFR; Diabetes mellitus, type II (12/10/2012.); and Anemia.       CURRENT MEDICATIONS: See CHL   SOCIAL HISTORY:  He lives in [place] and was raised in the area.  He completed 10th grade of high school.  He worked at [employer].  He is a widower.  His wife passed 3 years ago from a stroke.  He was married to her for  38 years.  He lives by himself.  He denies EtOH and illicit drug abuse.  He quit smoking about 16 years ago but he used to smoke 1 ppd x 20 years.    ALLERGIES: Niacin and related   LABORATORY DATA:  CBC    Component Value Date/Time   WBC 7.1 01/07/2013 1545   RBC 3.47* 01/07/2013 1545   HGB 10.9* 01/07/2013 1545   HCT 32.9* 01/07/2013 1545   PLT 254 01/07/2013 1545   MCV 94.8 01/07/2013  1545   MCH 31.4 01/07/2013 1545   MCHC 33.1 01/07/2013 1545   RDW 18.8* 01/07/2013 1545   LYMPHSABS 0.7 12/10/2012 2007   MONOABS 0.6 12/10/2012 2007   EOSABS 0.1 12/10/2012 2007   BASOSABS 0.0 12/10/2012 2007      Chemistry      Component Value Date/Time   NA 142 01/07/2013 1545   K 4.1 01/07/2013 1545   CL 105 01/07/2013 1545   CO2 19 01/07/2013 1545   BUN 33* 01/07/2013 1545   CREATININE 1.48* 01/07/2013 1545   CREATININE 1.69* 12/13/2012 0603      Component Value Date/Time   CALCIUM 9.2 01/07/2013 1545   ALKPHOS 77 01/07/2013 1545   AST 18 01/07/2013 1545   ALT 11 01/07/2013 1545   BILITOT 0.4 01/07/2013 1545     Lab Results  Component Value Date   IRON 53 12/11/2012   TIBC 230 12/11/2012   FERRITIN 1265* 12/11/2012     RADIOGRAPHY: No results found.      REVIEW OF SYSTEMS: Patient reports no health concerns.   PHYSICAL EXAM:  height is 6' 2"  (1.88 m) and weight is 289 lb (131.09 kg). His oral temperature is 98.1 F (36.7 C). His blood pressure is 147/80 and his pulse is 62. His respiration is 18.  General appearance: alert, cooperative, appears stated age, no distress, moderately obese and chronically ill appearing Head: Normocephalic, without obvious abnormality, atraumatic Neck: no adenopathy, supple, symmetrical, trachea midline and thyroid not enlarged, symmetric, no tenderness/mass/nodules Lymph nodes: Cervical, supraclavicular, and axillary nodes normal. Resp: clear to auscultation bilaterally and normal percussion bilaterally Back: symmetric, no curvature. ROM normal. No CVA tenderness. Cardio: regular rate and rhythm, S1, S2 normal, no murmur, click, rub or gallop GI: soft, non-tender; bowel sounds normal; no masses,  no organomegaly Extremities: edema B/L LE edema, 1-2+ pitting pretibially and Multiple gouty tophi deposits on B/L PIP and MIP of hands, B/L olecranon. Left thumb amputation.  Flattening of fingernails.  Neurologic: Alert and oriented X 3, normal  strength and tone. Normal symmetric reflexes. Normal coordination and gait     IMPRESSION:  1. Mild, normocytic anemia, likely anemia of chronic disease.  Iron studies unimpressive.  Asymptomatic.  No intervention required presently.  2. DM 3. HTN 4. Chronic renal disease, Stage 3, Stable 5. Gout with tophi deposits in PIP and MIP of hands B/L and B/L Olecranon.  Worsening. 6. 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 B/L LE edema, 1-2+ pitting   PLAN:  1. I personally reviewed and went over laboratory results with the patient. 2. Labs today: Vitamin B12, Folate, erythropoietin level today 3. Referral to Dr. Charlestine Night for gouty tophi deposits of PIP, MIP of hands, and B/L olecranon that are worsening. 4. Return in 1 year for follow-up. 5. Continue follow-up with PCP and cardiology  All questions were answered.  Patient knows to call the clinic with any questions or concerns.   Patient and plan discussed with Dr. Randall Hiss  Neijstrom and he is in agreement with the aforementioned.  Patient seen and examined by Dr. Tressie Stalker.  Nicki Gracy

## 2013-03-10 NOTE — Progress Notes (Signed)
[name] presented for labwork. Labs per MD order drawn via Peripheral Line 23 gauge needle inserted in right AC  Good blood return present. Procedure without incident.  Needle removed intact. Patient tolerated procedure well.

## 2013-03-11 LAB — ERYTHROPOIETIN: Erythropoietin: 39.2 m[IU]/mL — ABNORMAL HIGH (ref 2.6–18.5)

## 2013-03-11 LAB — VITAMIN B12: Vitamin B-12: 360 pg/mL (ref 211–911)

## 2013-03-16 ENCOUNTER — Encounter: Payer: Self-pay | Admitting: *Deleted

## 2013-03-16 ENCOUNTER — Other Ambulatory Visit: Payer: Self-pay | Admitting: *Deleted

## 2013-03-16 DIAGNOSIS — Z7901 Long term (current) use of anticoagulants: Secondary | ICD-10-CM

## 2013-03-22 ENCOUNTER — Telehealth: Payer: Self-pay | Admitting: Cardiology

## 2013-03-22 NOTE — Telephone Encounter (Signed)
Attempted to contact patient.  No answer at this time.

## 2013-03-22 NOTE — Telephone Encounter (Signed)
Patient states that Dr.Rothbart told him to call when he got low on Brillinta so we could order him some more.  He is calling to say he is down to one bottle. / tgs

## 2013-03-25 NOTE — Telephone Encounter (Signed)
Attempted to, again contact patient regarding Brilinta.  Will supply him with Free 30 day supply card and enroll in assistance program.

## 2013-03-26 NOTE — Telephone Encounter (Signed)
Call attempt x 3.  Message left on answering machine for a return call.

## 2013-03-29 NOTE — Telephone Encounter (Signed)
4 th attempt.  No answer.  Will await him to contact us regarding Brilinta.

## 2013-04-02 ENCOUNTER — Encounter: Payer: Self-pay | Admitting: *Deleted

## 2013-04-05 ENCOUNTER — Telehealth: Payer: Self-pay | Admitting: Cardiology

## 2013-04-05 NOTE — Telephone Encounter (Signed)
Refill requested from [facility] and will be shipped to patient's home.  In the meantime, he will be provided with a 30 day voucher, so that he will not have a lapse in coverage.  Verbalized understanding.

## 2013-04-05 NOTE — Telephone Encounter (Signed)
Ticagrelor (BRILINTA) 90 MG TABS tablet  Patient states he is running low of medication and needs a refill.  Patient states that he receives these through mail, possible drug assistance?  Patient gave the following information:  FED EX  [phone] [facility]

## 2013-04-07 ENCOUNTER — Telehealth: Payer: Self-pay | Admitting: *Deleted

## 2013-04-07 NOTE — Telephone Encounter (Signed)
Patient called and asked if he needs to be on blood thinner prior to getting injections in his back.

## 2013-04-07 NOTE — Telephone Encounter (Signed)
Please advise 

## 2013-04-08 NOTE — Telephone Encounter (Signed)
I am assuming these are epidural injections, which cannot be performed while he is taking the antiplatelet agent,Brillinta.  There is a risk to discontinuing the drug at this point that is probably not is taking for an epidural injection.  Accordingly, the physicians providing treatment for his back pain should treat with oral medication.

## 2013-04-08 NOTE — Telephone Encounter (Signed)
Have attempted to contact patient.  Listed number is disconnected.

## 2013-04-13 NOTE — Telephone Encounter (Signed)
Still note inability to contact pt with any number listed in pt chart, will attempt to contact again

## 2013-04-15 ENCOUNTER — Encounter: Payer: Self-pay | Admitting: *Deleted

## 2013-04-15 NOTE — Telephone Encounter (Signed)
Mobile number in chart still out of order, called [phone], no vm capibilty noted, will send pt a certified letter to advise to call office per their concern per also called pt brother [name] listed in chart with no success to speak to a person or leave a vm  This nurse alerted Dr Lattie Haw and manager Leveda Anna to advise verbally that  several attempts to contact pt, advised certified letter has been sent

## 2013-04-16 NOTE — Telephone Encounter (Addendum)
FYI:Pt brought in a prescription pad order stating the following:  Rx: Could I do bridge therapy with Lovenox to come off brilinta for 5 days to get back injection? PCP would give/manage Lovenox bridge  Noted the Physician to contact as Collene Mares PA-C at [facility] [phone] This nurse called [place] office to speak to Caryl Pina, RN to advise that Dr Jeri Lager still does not advise that the pt stop taking Brilinta secondary to cardiac risk.  Clarified injections requested are epidural.  Current Rx-Toradol.   Referred to a neurosurgeon, still currently taking hydrocodone.  Caryl Pina requested the instructions per Dr Jeri Lager to be faxed to [phone]-> done.

## 2013-04-20 NOTE — Telephone Encounter (Signed)
Received certified letter receipt [phone], pt also came into office and was verbally informed about Dr RR decision, pt understood

## 2013-05-26 ENCOUNTER — Other Ambulatory Visit: Payer: Self-pay | Admitting: Adult Health

## 2013-06-07 ENCOUNTER — Encounter (HOSPITAL_COMMUNITY): Payer: Self-pay | Admitting: Emergency Medicine

## 2013-06-07 ENCOUNTER — Emergency Department (HOSPITAL_COMMUNITY)
Admission: EM | Admit: 2013-06-07 | Discharge: 2013-06-07 | Disposition: A | Discharge status: Home / Self Care | Payer: Medicare Other | Attending: Emergency Medicine | Admitting: Emergency Medicine

## 2013-06-07 DIAGNOSIS — Y9389 Activity, other specified: Secondary | ICD-10-CM | POA: Insufficient documentation

## 2013-06-07 DIAGNOSIS — E7800 Pure hypercholesterolemia, unspecified: Secondary | ICD-10-CM | POA: Insufficient documentation

## 2013-06-07 DIAGNOSIS — D72829 Elevated white blood cell count, unspecified: Secondary | ICD-10-CM | POA: Insufficient documentation

## 2013-06-07 DIAGNOSIS — Z79899 Other long term (current) drug therapy: Secondary | ICD-10-CM | POA: Insufficient documentation

## 2013-06-07 DIAGNOSIS — I5030 Unspecified diastolic (congestive) heart failure: Secondary | ICD-10-CM | POA: Insufficient documentation

## 2013-06-07 DIAGNOSIS — Z862 Personal history of diseases of the blood and blood-forming organs and certain disorders involving the immune mechanism: Secondary | ICD-10-CM | POA: Insufficient documentation

## 2013-06-07 DIAGNOSIS — I129 Hypertensive chronic kidney disease with stage 1 through stage 4 chronic kidney disease, or unspecified chronic kidney disease: Secondary | ICD-10-CM | POA: Insufficient documentation

## 2013-06-07 DIAGNOSIS — IMO0002 Reserved for concepts with insufficient information to code with codable children: Secondary | ICD-10-CM | POA: Insufficient documentation

## 2013-06-07 DIAGNOSIS — Z8639 Personal history of other endocrine, nutritional and metabolic disease: Secondary | ICD-10-CM | POA: Insufficient documentation

## 2013-06-07 DIAGNOSIS — M1710 Unilateral primary osteoarthritis, unspecified knee: Secondary | ICD-10-CM | POA: Insufficient documentation

## 2013-06-07 DIAGNOSIS — E871 Hypo-osmolality and hyponatremia: Secondary | ICD-10-CM | POA: Insufficient documentation

## 2013-06-07 DIAGNOSIS — Z87891 Personal history of nicotine dependence: Secondary | ICD-10-CM | POA: Insufficient documentation

## 2013-06-07 DIAGNOSIS — R5381 Other malaise: Secondary | ICD-10-CM | POA: Insufficient documentation

## 2013-06-07 DIAGNOSIS — I2510 Atherosclerotic heart disease of native coronary artery without angina pectoris: Secondary | ICD-10-CM | POA: Insufficient documentation

## 2013-06-07 DIAGNOSIS — E119 Type 2 diabetes mellitus without complications: Secondary | ICD-10-CM | POA: Insufficient documentation

## 2013-06-07 DIAGNOSIS — E669 Obesity, unspecified: Secondary | ICD-10-CM | POA: Insufficient documentation

## 2013-06-07 DIAGNOSIS — Z8679 Personal history of other diseases of the circulatory system: Secondary | ICD-10-CM | POA: Insufficient documentation

## 2013-06-07 DIAGNOSIS — Z8669 Personal history of other diseases of the nervous system and sense organs: Secondary | ICD-10-CM | POA: Insufficient documentation

## 2013-06-07 DIAGNOSIS — N183 Chronic kidney disease, stage 3 unspecified: Secondary | ICD-10-CM | POA: Insufficient documentation

## 2013-06-07 DIAGNOSIS — R531 Weakness: Secondary | ICD-10-CM

## 2013-06-07 DIAGNOSIS — M25539 Pain in unspecified wrist: Secondary | ICD-10-CM | POA: Insufficient documentation

## 2013-06-07 DIAGNOSIS — Z8739 Personal history of other diseases of the musculoskeletal system and connective tissue: Secondary | ICD-10-CM | POA: Insufficient documentation

## 2013-06-07 DIAGNOSIS — W07XXXA Fall from chair, initial encounter: Secondary | ICD-10-CM | POA: Insufficient documentation

## 2013-06-07 DIAGNOSIS — Y92009 Unspecified place in unspecified non-institutional (private) residence as the place of occurrence of the external cause: Secondary | ICD-10-CM | POA: Insufficient documentation

## 2013-06-07 DIAGNOSIS — Z7982 Long term (current) use of aspirin: Secondary | ICD-10-CM | POA: Insufficient documentation

## 2013-06-07 LAB — CBC WITH DIFFERENTIAL/PLATELET
Eosinophils Absolute: 0 10*3/uL (ref 0.0–0.7)
Hemoglobin: 11 g/dL — ABNORMAL LOW (ref 13.0–17.0)
Lymphocytes Relative: 3 % — ABNORMAL LOW (ref 12–46)
Lymphs Abs: 0.5 10*3/uL — ABNORMAL LOW (ref 0.7–4.0)
MCH: 30.9 pg (ref 26.0–34.0)
Monocytes Relative: 10 % (ref 3–12)
Neutro Abs: 13.7 10*3/uL — ABNORMAL HIGH (ref 1.7–7.7)
Neutrophils Relative %: 87 % — ABNORMAL HIGH (ref 43–77)
Platelets: 189 10*3/uL (ref 150–400)
RBC: 3.56 MIL/uL — ABNORMAL LOW (ref 4.22–5.81)
WBC: 15.8 10*3/uL — ABNORMAL HIGH (ref 4.0–10.5)

## 2013-06-07 LAB — BASIC METABOLIC PANEL
Calcium: 9.7 mg/dL (ref 8.4–10.5)
GFR calc Af Amer: 34 mL/min — ABNORMAL LOW (ref >90)
GFR calc non Af Amer: 29 mL/min — ABNORMAL LOW (ref >90)
Potassium: 4.3 mEq/L (ref 3.5–5.1)
Sodium: 130 mEq/L — ABNORMAL LOW (ref 135–145)

## 2013-06-07 MED ORDER — HYDROCODONE-ACETAMINOPHEN 5-325 MG PO TABS: 2.0000 | ORAL_TABLET | ORAL | Status: DC | PRN

## 2013-06-07 MED ORDER — OXYCODONE-ACETAMINOPHEN 5-325 MG PO TABS
2.0000 | ORAL_TABLET | Freq: Once | ORAL | Status: AC
  Administered 2013-06-07: 2 via ORAL
  Filled 2013-06-07: qty 2

## 2013-06-07 NOTE — ED Notes (Signed)
Patient states he is unable to urinate. Advised MD. Hanley Seamen patient ginger ale to drink as requested and approved by MD.

## 2013-06-07 NOTE — ED Notes (Signed)
Patient states he needs knee replacement bilaterally but can't have it due to "the doctor has to get my medicines right." States he was being assisted by his godson "and my knees just got weak and gave out." Patient denies pain from fall. States his knees hurt all the time before the fall.

## 2013-06-07 NOTE — ED Notes (Signed)
Patient needs bilateral knee replacements; states his knees gave out on him and he fell; was assisted by his god-son.  Patient states he doesn't hurt anywhere from the fall, but his knees hurt before the fall.

## 2013-06-07 NOTE — ED Provider Notes (Signed)
History  This chart was scribed for [name] Acosta, MD by Era Bumpers, ED scribe. This patient was seen in room APA14/APA14 and the patient's care was started at 1952.   CSN: [id number]  Arrival date & time 06/07/13  1952   None     Chief Complaint  Patient presents with  . Knee Pain  . Fall   Patient is a 67 y.o. male presenting with knee pain and fall. The history is provided by the patient. No language interpreter was used.  Knee Pain Location:  Knee Injury: no   Knee location:  L knee and R knee Pain details:    Radiates to:  Does not radiate   Severity:  Moderate   Onset quality:  Sudden   Timing:  Constant   Progression:  Unchanged Chronicity:  New Dislocation: no   Foreign body present:  No foreign bodies Associated symptoms: no fever   Fall Pertinent negatives include no shortness of breath.   HPI Comments: [name] is a 67 y.o. male who presents to the Emergency Department complaining of acute onset bilateral knee weakness after he fell forward out of his chair at home while trying to stand up because his knees gave away. He states increased bilateral knee pain to his baseline knee pain due to arthritis and scheduled to have a bilateral knee replacement soon. He also states a hx of gout and has associated right wrist pain. He denies any other injuries or pain. He lives alone at home and does not live with an assisted living personnel.  Nothing makes weakness better or worse, it is constant and involving bilateral LE's.     Past Medical History  Diagnosis Date  . Diastolic heart failure     Pedal edema  . Hypertension     Exercise induced  . NSVT (nonsustained ventricular tachycardia)     Exercise induced  . 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 Obesity   . Degenerative joint disease   . Benign prostatic hypertrophy     Nocturia  . Erectile dysfunction   . High cholesterol   . Obstructive sleep apnea     Probable sleep study  to be done  . CAD (coronary artery disease)     a.  NSTEMI 10/2012 s/p DES to LAD & DES to 1st diagonal with residual diffuse nonobstructive dz in LCx/RCA   . Chronic kidney disease, stage 3, mod decreased GFR     Creatinine of 1.5 in 03/2009  . Diabetes mellitus, type II 12/10/2012.  Marland Kitchen Anemia     Past Surgical History  Procedure Laterality Date  . Back surgery      Family History  Problem Relation Age of Onset  . Hypertension Other   . Cancer Other   . Heart disease    . Diabetes      History  Substance Use Topics  . Smoking status: Former Smoker -- 1.00 packs/day for 30 years    Quit date: 12/23/1992  . Smokeless tobacco: Not on file  . Alcohol Use: No      Review of Systems  Constitutional: Negative for fever and chills.  Respiratory: Negative for shortness of breath.   Gastrointestinal: Negative for nausea and vomiting.  Musculoskeletal:       Bilateral knee pain  Neurological: Negative for weakness.  All other systems reviewed and are negative.   A complete 10 system review of systems was obtained and all  systems are negative except as noted in the HPI and PMH.   Allergies  Niacin and related  Home Medications   Current Outpatient Rx  Name  Route  Sig  Dispense  Refill  . allopurinol (ZYLOPRIM) 300 MG tablet   Oral   Take 300 mg by mouth daily.         Marland Kitchen ALPRAZolam (XANAX) 1 MG tablet   Oral   Take 0.5-1 mg by mouth at bedtime. For anxiety         . amLODipine (NORVASC) 10 MG tablet   Oral   Take 10 mg by mouth daily.         Marland Kitchen aspirin 81 MG chewable tablet   Oral   Chew 81 mg by mouth daily.         . furosemide (LASIX) 40 MG tablet   Oral   Take 1.5 tablets (60 mg total) by mouth daily.   45 tablet   3   . gabapentin (NEURONTIN) 300 MG capsule   Oral   Take 300 mg by mouth 3 (three) times daily.         Marland Kitchen HYDROcodone-acetaminophen (NORCO/VICODIN) 5-325 MG per tablet   Oral   Take 1 tablet by mouth every 6 (six) hours as  needed for pain.         . metoprolol (LOPRESSOR) 50 MG tablet   Oral   Take 1 tablet (50 mg total) by mouth 2 (two) times daily.   60 tablet   6   . ONGLYZA 5 MG TABS tablet   Oral   Take 5 mg by mouth daily.          . pravastatin (PRAVACHOL) 20 MG tablet   Oral   Take 20 mg by mouth daily.         . predniSONE (DELTASONE) 5 MG tablet   Oral   Take 5-30 mg by mouth daily. Takes 6 tablets daily for 3 days, then take 5 tablets for 2 days, then take 4 tablets for 2 days, then take 3 tablets for 2 days, then take 2 tablets for 2 days, then take 1 tablet for 2 days         . Ticagrelor (BRILINTA) 90 MG TABS tablet   Oral   Take 1 tablet (90 mg total) by mouth 2 (two) times daily.   32 tablet   0     Lot #[id number] Exp-06/22/2014 Samples request by To ...   . HYDROcodone-acetaminophen (NORCO/VICODIN) 5-325 MG per tablet   Oral   Take 2 tablets by mouth every 4 (four) hours as needed for pain.   6 tablet   0     Triage Vitals: BP 123/78  Pulse 106  Temp(Src) 98.6 F (37 C) (Oral)  Resp 18  Ht 6' 2"  (1.88 m)  Wt 275 lb (124.739 kg)  BMI 35.29 kg/m2  SpO2 98%  Physical Exam  Nursing note and vitals reviewed. Constitutional: He is oriented to person, place, and time. He appears well-developed and well-nourished. No distress.  HENT:  Head: Normocephalic and atraumatic.  Eyes: EOM are normal.  Neck: Neck supple. No tracheal deviation present.  Cardiovascular: Normal rate.   Pulmonary/Chest: Effort normal. No respiratory distress.  Abdominal: Soft. There is no tenderness.  Musculoskeletal: Normal range of motion. He exhibits no tenderness ( no tenderness with ROM of the knees.).  Neurological: He is alert and oriented to person, place, and time.  Able to SLR normally bilaterally.  Speech  is clear, memory is intact, coordination is normal.  No focal weakness, normal sensation  Skin: Skin is warm and dry.  Psychiatric: He has a normal mood and affect. His  behavior is normal.    ED Course  Procedures (including critical care time) DIAGNOSTIC STUDIES: Oxygen Saturation is 98% on room air, normal by my interpretation.    COORDINATION OF CARE: At 805 PM Discussed treatment plan with patient which includes pain medicine. Patient agrees.   Labs Reviewed  BASIC METABOLIC PANEL - Abnormal; Notable for the following:    Sodium 130 (*)    Chloride 90 (*)    Glucose, Bld 257 (*)    BUN 36 (*)    Creatinine, Ser 2.21 (*)    GFR calc non Af Amer 29 (*)    GFR calc Af Amer 34 (*)    All other components within normal limits  CBC WITH DIFFERENTIAL - Abnormal; Notable for the following:    WBC 15.8 (*)    RBC 3.56 (*)    Hemoglobin 11.0 (*)    HCT 32.9 (*)    RDW 16.3 (*)    Neutrophils Relative % 87 (*)    Neutro Abs 13.7 (*)    Lymphocytes Relative 3 (*)    Lymphs Abs 0.5 (*)    Monocytes Absolute 1.6 (*)    All other components within normal limits  URINALYSIS, ROUTINE W REFLEX MICROSCOPIC   No results found.   1. Weakness   2. Hyponatremia   3. Leukocytosis       MDM  Pt has no focal weakness, he is concerned about living by himself due to his progressive weakness and need for knee replacements however he has been able to ambulate with a walker unassisted here.  He is amenable to d/c with f/u and SW will be consulted to call in the morning to arrange in home services if available.    Lab work reveals mild hyponatremia and renal insufficiency - also shows a leukocytosis - pt states that he can't make any urine tonight but has been urinating fine today.  He states that he has no pain in his knees, no cough and no rashes.  At this time, would benefit from ongoing outpt f/u for chronic medical problems.  Again he refuses in and out cath.  Filed Vitals:   06/07/13 1955 06/07/13 2157  BP: 123/78 139/61  Pulse: 106 87  Temp: 98.6 F (37 C)   TempSrc: Oral   Resp: 18 18  Height: 6' 2"  (1.88 m)   Weight: 275 lb (124.739 kg)    SpO2: 98% 96%     Pt expresses understanding and is amenable to the plan.   I personally performed the services described in this documentation, which was scribed in my presence. The recorded information has been reviewed and is accurate.  Meds given in ED:  Medications  oxyCODONE-acetaminophen (PERCOCET/ROXICET) 5-325 MG per tablet 2 tablet (2 tablets Oral Given 06/07/13 2020)    New Prescriptions   HYDROCODONE-ACETAMINOPHEN (NORCO/VICODIN) 5-325 MG PER TABLET    Take 2 tablets by mouth every 4 (four) hours as needed for pain.               [name] Acosta, MD 06/07/13 [phone]

## 2013-06-09 NOTE — Care Management Note (Signed)
    Page 1 of 1   06/08/2013     3:55:48 PM   CARE MANAGEMENT NOTE 06/08/2013  Patient:  KONA, LOVER   Account Number:  [id number]  Date Initiated:  06/08/2013  Documentation initiated by:  Theophilus Kinds  Subjective/Objective Assessment:   CM called by ED staff stating that pt had been in the Ed the previous night and discharged back home. Pt was requesting some help in the home. Pt was called by Cm and pt stated that he needed to go somewhere or have someone in the home     Action/Plan:   to help him cook and clean. CM explained that Cm could not arrange that for him and he would need PT consult for placement. Cm did talk with pts [place], Patty with [facility] and she will arrange PT eval for possible placement.   Anticipated DC Date:  06/07/2013   Anticipated DC Plan:  [place]  CM consult      Choice offered to / List presented to:             Status of service:  Completed, signed off Medicare Important Message given?   (If response is "NO", the following Medicare IM given date fields will be blank) Date Medicare IM given:   Date Additional Medicare IM given:    Discharge Disposition:  [place]  Per UR Regulation:    If discussed at Long Length of Stay Meetings, dates discussed:    Comments:  06/08/13 [place], RN BSN CM

## 2013-06-10 ENCOUNTER — Other Ambulatory Visit: Payer: Self-pay | Admitting: Cardiovascular Disease

## 2013-06-15 ENCOUNTER — Encounter: Payer: Self-pay | Admitting: Cardiology

## 2013-06-15 ENCOUNTER — Ambulatory Visit (INDEPENDENT_AMBULATORY_CARE_PROVIDER_SITE_OTHER): Payer: Medicare Other | Admitting: Cardiology

## 2013-06-15 VITALS — BP 142/86 | HR 72 | Ht 74.0 in | Wt 276.0 lb

## 2013-06-15 DIAGNOSIS — I2510 Atherosclerotic heart disease of native coronary artery without angina pectoris: Secondary | ICD-10-CM

## 2013-06-15 DIAGNOSIS — D649 Anemia, unspecified: Secondary | ICD-10-CM

## 2013-06-15 DIAGNOSIS — I7090 Unspecified atherosclerosis: Secondary | ICD-10-CM

## 2013-06-15 DIAGNOSIS — N183 Chronic kidney disease, stage 3 unspecified: Secondary | ICD-10-CM

## 2013-06-15 DIAGNOSIS — I10 Essential (primary) hypertension: Secondary | ICD-10-CM

## 2013-06-15 DIAGNOSIS — E663 Overweight: Secondary | ICD-10-CM

## 2013-06-15 NOTE — Progress Notes (Deleted)
Name: [name]    DOB: [birth year]  Age: 67 y.o.  MR#: [id number]       PCP:  [name], MD      Insurance: Payor: [name] MEDICARE / Plan: AARP MEDICARE COMPLETE / Product Type: *No Product type* /   CC:   No chief complaint on file.  NO LIST VS Filed Vitals:   06/15/13 1407  BP: 142/86  Pulse: 72  Height: 6' 2"  (1.88 m)  Weight: 276 lb (125.193 kg)  SpO2: 99%    Weights Current Weight  06/15/13 276 lb (125.193 kg)  06/07/13 275 lb (124.739 kg)  03/10/13 289 lb (131.09 kg)    Blood Pressure  BP Readings from Last 3 Encounters:  06/15/13 142/86  06/07/13 139/61  03/10/13 147/80     Admit date:  (Not on file) Last encounter with RMR:  04/05/2013   Allergy Niacin and related  Current Outpatient Prescriptions  Medication Sig Dispense Refill  . allopurinol (ZYLOPRIM) 300 MG tablet Take 300 mg by mouth daily.      Marland Kitchen ALPRAZolam (XANAX) 1 MG tablet Take 0.5-1 mg by mouth at bedtime. For anxiety      . amLODipine (NORVASC) 10 MG tablet Take 10 mg by mouth daily.      Marland Kitchen aspirin 81 MG chewable tablet Chew 81 mg by mouth daily.      . furosemide (LASIX) 40 MG tablet Take 1.5 tablets (60 mg total) by mouth daily.  45 tablet  3  . gabapentin (NEURONTIN) 300 MG capsule Take 300 mg by mouth 2 (two) times daily.       Marland Kitchen HYDROcodone-acetaminophen (NORCO/VICODIN) 5-325 MG per tablet Take 2 tablets by mouth every 4 (four) hours as needed for pain.  6 tablet  0  . metoprolol (LOPRESSOR) 50 MG tablet TAKE ONE TABLET TWICE DAILY  60 tablet  11  . NITROSTAT 0.4 MG SL tablet Place 0.4 mg under the tongue every 5 (five) minutes as needed.       . ONGLYZA 5 MG TABS tablet Take 5 mg by mouth daily.       . pravastatin (PRAVACHOL) 20 MG tablet Take 20 mg by mouth daily.      Derrill Memo ON 06/19/2013] predniSONE (DELTASONE) 5 MG tablet Take 5-30 mg by mouth daily. Takes 6 tablets daily for 3 days, then take 5 tablets for 2 days, then take 4 tablets for 2 days, then take 3 tablets  for 2 days, then take 2 tablets for 2 days, then take 1 tablet for 2 days      . Ticagrelor (BRILINTA) 90 MG TABS tablet Take 1 tablet (90 mg total) by mouth 2 (two) times daily.  32 tablet  0   No current facility-administered medications for this visit.    Discontinued Meds:    Medications Discontinued During This Encounter  Medication Reason  . HYDROcodone-acetaminophen (NORCO/VICODIN) 5-325 MG per tablet Patient Preference  . metFORMIN (GLUCOPHAGE) 500 MG tablet Error    Patient Active Problem List   Diagnosis Date Noted  . Anemia, normocytic normochromic 01/12/2013  . Diabetes mellitus 12/10/2012  . 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 Overweight 11/17/2012  . Obstructive sleep apnea 11/15/2012  . Chronic kidney disease, stage 3, mod decreased GFR 08/23/2011  . Hypertension     LABS    Component Value Date/Time   NA 130* 06/07/2013 2022   NA 142 01/07/2013 1545   NA 130* 12/13/2012 0603   K 4.3 06/07/2013  2022   K 4.1 01/07/2013 1545   K 4.5 12/13/2012 0603   CL 90* 06/07/2013 2022   CL 105 01/07/2013 1545   CL 99 12/13/2012 0603   CO2 22 06/07/2013 2022   CO2 19 01/07/2013 1545   CO2 23 12/13/2012 0603   GLUCOSE 257* 06/07/2013 2022   GLUCOSE 160* 01/07/2013 1545   GLUCOSE 208* 12/13/2012 0603   BUN 36* 06/07/2013 2022   BUN 33* 01/07/2013 1545   BUN 49* 12/13/2012 0603   CREATININE 2.21* 06/07/2013 2022   CREATININE 1.48* 01/07/2013 1545   CREATININE 1.69* 12/13/2012 0603   CREATININE 2.00* 12/12/2012 0606   CREATININE 2.69* 11/24/2012 1442   CALCIUM 9.7 06/07/2013 2022   CALCIUM 9.2 01/07/2013 1545   CALCIUM 9.3 12/13/2012 0603   GFRNONAA 29* 06/07/2013 2022   GFRNONAA 40* 12/13/2012 0603   GFRNONAA 33* 12/12/2012 0606   GFRAA 34* 06/07/2013 2022   GFRAA 47* 12/13/2012 0603   GFRAA 38* 12/12/2012 0606   CMP     Component Value Date/Time   NA 130* 06/07/2013 2022   K 4.3 06/07/2013 2022   CL 90* 06/07/2013 2022   CO2 22 06/07/2013 2022    GLUCOSE 257* 06/07/2013 2022   BUN 36* 06/07/2013 2022   CREATININE 2.21* 06/07/2013 2022   CREATININE 1.48* 01/07/2013 1545   CALCIUM 9.7 06/07/2013 2022   PROT 7.3 01/07/2013 1545   ALBUMIN 3.9 01/07/2013 1545   AST 18 01/07/2013 1545   ALT 11 01/07/2013 1545   ALKPHOS 77 01/07/2013 1545   BILITOT 0.4 01/07/2013 1545   GFRNONAA 29* 06/07/2013 2022   GFRAA 34* 06/07/2013 2022       Component Value Date/Time   WBC 15.8* 06/07/2013 2022   WBC 7.1 01/07/2013 1545   WBC 11.2* 12/12/2012 0606   HGB 11.0* 06/07/2013 2022   HGB 10.9* 01/07/2013 1545   HGB 10.5* 12/12/2012 0606   HCT 32.9* 06/07/2013 2022   HCT 32.9* 01/07/2013 1545   HCT 30.9* 12/12/2012 0606   MCV 92.4 06/07/2013 2022   MCV 94.8 01/07/2013 1545   MCV 91.4 12/12/2012 0606    Lipid Panel     Component Value Date/Time   CHOL 121 11/15/2012 0625   TRIG 57 11/15/2012 0625   HDL 36* 11/15/2012 0625   CHOLHDL 3.4 11/15/2012 0625   VLDL 11 11/15/2012 0625   LDLCALC 74 11/15/2012 0625    ABG No results found for this basename: phart, pco2, pco2art, po2, po2art, hco3, tco2, acidbasedef, o2sat     Lab Results  Component Value Date   TSH 0.407 01/07/2013   BNP (last 3 results)  Recent Labs  11/15/12 0015  PROBNP 86.0   Cardiac Panel (last 3 results) No results found for this basename: CKTOTAL, CKMB, TROPONINI, RELINDX,  in the last 72 hours  Iron/TIBC/Ferritin    Component Value Date/Time   IRON 53 12/11/2012 0530   TIBC 230 12/11/2012 0530   FERRITIN 1265* 12/11/2012 0530     EKG Orders placed during the hospital encounter of 12/10/12  . ED EKG  . ED EKG  . EKG 12-LEAD  . EKG 12-LEAD  . EKG     Prior Assessment and Plan Problem List as of 06/15/2013   Hypertension   Last Assessment & Plan   02/08/2013 Office Visit Written 02/08/2013  4:46 PM by Yehuda Savannah, MD     Blood pressure is much better controlled than it typically has been in the past. Current medication will be  continued.    Chronic kidney  disease, stage 3, mod decreased GFR   Last Assessment & Plan   02/08/2013 Office Visit Written 02/08/2013  4:44 PM by Yehuda Savannah, MD     BUN and creatinine have improved substantially since 2011. Values are minimally prerenal, probably related to diuretics, but current levels are entirely acceptable.    Obstructive sleep apnea   Overweight   Arteriosclerotic cardiovascular disease (ASCVD)   Last Assessment & Plan   02/08/2013 Office Visit Written 02/08/2013  4:42 PM by Yehuda Savannah, MD     Stable clinical course since two-vessel intervention. Optimal medical therapy will be continued.    Diabetes mellitus   Last Assessment & Plan   02/08/2013 Office Visit Edited 02/11/2013  8:15 PM by Yehuda Savannah, MD     Patient denies that he has diabetes, believing that previous elevations in CBGs and hemoglobin A1c have been entirely related to physiologic stress. He has decreased his dose of metformin unilaterally, but reports blood glucose values typically less than 120. A repeat hemoglobin A1c level will be obtained.    Anemia, normocytic normochromic   Last Assessment & Plan   02/08/2013 Office Visit Edited 02/11/2013  8:14 PM by Yehuda Savannah, MD     Long-standing moderate anemia. Iron studies in the past did not suggest iron deficiency; in fact, there is a substantial elevation of ferritin. Renal dysfunction may be contributing, but the magnitude of anemia is out of proportion to kidney disease. Referral to Hematology advised.        Imaging: No results found.

## 2013-06-15 NOTE — Assessment & Plan Note (Signed)
Mr. Savage has been stable from a cardiac standpoint since intervention for an acute myocardial infarction 7 months ago. He is willing to wait to complete a a one-year course of dual antiplatelet therapy prior to undergoing TKA.

## 2013-06-15 NOTE — Assessment & Plan Note (Addendum)
Creatinine has increased substantially. His only potentially nephrotoxic drug is diuretic. We will reduce his dose of furosemide to 20 mg per day. He will monitor weight and call for any significant increase.

## 2013-06-15 NOTE — Assessment & Plan Note (Signed)
New leukocytosis, possibly related to acute gout.  Persistent anemia, likely related to chronic kidney disease. Patient is not symptomatic and does not appear to require medication to stimulate erythropoiesis.Marland Kitchen

## 2013-06-15 NOTE — Patient Instructions (Addendum)
Your physician recommends that you schedule a follow-up appointment in: [place] physician has recommended you make the following change in your medication:   1) DECREASE LASIX TO 20MG ONCE DAILY  Your physician recommends that you return for lab work in: [place] physician recommends that you have follow up lab work, we will mail you a reminder letter to alert you when to go [employer], located across the street from our office.    Your physician recommends THAT YOU CALL OUR OFFICE FOR ANY WEIGHT GAIN 5LBS OR MORE [phone]

## 2013-06-15 NOTE — Progress Notes (Signed)
Patient ID: [name], male   DOB: 12-Jun-1946, 67 y.o.   MRN: [id number]  HPI: Scheduled return visit for this delightful gentleman with long-standing hypertension and hypertensive heart disease as well as recently diagnosed coronary artery disease. 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  Pain has not been severe and has been well controlled with occasional use of oral narcotics. He is proud that he has achieved a significant weight loss from his previous high of 330 pounds.  He recently developed gout and is currently completing a prednisone taper.  Current Outpatient Prescriptions  Medication Sig Dispense Refill  . allopurinol (ZYLOPRIM) 300 MG tablet Take 300 mg by mouth daily.      Marland Kitchen ALPRAZolam (XANAX) 1 MG tablet Take 0.5-1 mg by mouth at bedtime. For anxiety      . amLODipine (NORVASC) 10 MG tablet Take 10 mg by mouth daily.      Marland Kitchen aspirin 81 MG chewable tablet Chew 81 mg by mouth daily.      . furosemide (LASIX) 40 MG tablet Take 20 mg by mouth daily.      Marland Kitchen gabapentin (NEURONTIN) 300 MG capsule Take 300 mg by mouth 2 (two) times daily.       Marland Kitchen HYDROcodone-acetaminophen (NORCO/VICODIN) 5-325 MG per tablet Take 2 tablets by mouth every 4 (four) hours as needed for pain.  6 tablet  0  . metoprolol (LOPRESSOR) 50 MG tablet TAKE ONE TABLET TWICE DAILY  60 tablet  11  . NITROSTAT 0.4 MG SL tablet Place 0.4 mg under the tongue every 5 (five) minutes as needed.       . ONGLYZA 5 MG TABS tablet Take 5 mg by mouth daily.       . pravastatin (PRAVACHOL) 20 MG tablet Take 20 mg by mouth daily.      Derrill Memo ON 06/19/2013] predniSONE (DELTASONE) 5 MG tablet Take 5-30 mg by mouth daily. Takes 6 tablets daily for 3 days, then take 5 tablets for 2 days, then take 4 tablets for 2 days, then take 3 tablets for 2 days, then take 2 tablets for 2 days, then take 1 tablet for 2 days      . Ticagrelor (BRILINTA) 90 MG TABS tablet Take 1 tablet  (90 mg total) by mouth 2 (two) times daily.  32 tablet  0   No current facility-administered medications for this visit.   Allergies  Allergen Reactions  . Niacin And Related Other (See Comments)    Reaction: flushing and burning side effects even with extended release     Past medical history, social history, and family history reviewed and updated.  ROS: Denies orthopnea, PND, chest pain or dyspnea. He has had some edema that has not been troublesome to him.  PHYSICAL EXAM: BP 142/86  Pulse 72  Ht 6' 2"  (1.88 m)  Wt 125.193 kg (276 lb)  BMI 35.42 kg/m2  SpO2 99%;  Body mass index is 35.42 kg/(m^2). General-Well developed; no acute distress Body habitus-moderately overweight Neck-No JVD; no carotid bruits Lungs-clear lung fields; resonant to percussion Cardiovascular-normal PMI; normal S1 and S2; modest systolic murmur Abdomen-normal bowel sounds; soft and non-tender without masses or organomegaly Musculoskeletal-No deformities, no cyanosis or clubbing Neurologic-Normal cranial nerves; symmetric strength and tone Skin-Warm, no significant lesions Extremities-distal pulses intact; 1+ ankle edema  Jacqulyn Ducking, MD 06/15/2013  3:09 PM  ASSESSMENT AND PLAN

## 2013-06-15 NOTE — Assessment & Plan Note (Signed)
Patient congratulated on weight loss and encouraged to continue caloric restriction. The benefits in terms of management of his current DJD and promoting long-term good function of his planned prosthetic knees were emphasized.

## 2013-06-15 NOTE — Assessment & Plan Note (Addendum)
Blood pressure control is good with current medication, which will be continued.

## 2013-06-17 MED FILL — Oxycodone w/ Acetaminophen Tab 5-325 MG: ORAL | Qty: 6 | Status: AC

## 2013-06-24 ENCOUNTER — Telehealth: Payer: Self-pay | Admitting: *Deleted

## 2013-06-24 NOTE — Telephone Encounter (Signed)
Late entry: called pt to advise on 06-21-13 this nurse called into [facility] to have his refill for Brilinta mailed to his home, left vm to advise if any further assistance needed please call our office

## 2013-06-28 ENCOUNTER — Telehealth: Payer: Self-pay | Admitting: Cardiology

## 2013-06-28 DIAGNOSIS — N183 Chronic kidney disease, stage 3 unspecified: Secondary | ICD-10-CM

## 2013-06-28 NOTE — Telephone Encounter (Signed)
Patient states that his "fluid pill" was cut to half.  He states that he has started taking a whole again due to gaining fluid. / tgs

## 2013-06-29 NOTE — Assessment & Plan Note (Signed)
Continued furosemide 40 mg per day; Bmet in 2 weeks.

## 2013-06-29 NOTE — Telephone Encounter (Signed)
Continue furosemide 40 mg per day. Bmet in 2 weeks.

## 2013-06-29 NOTE — Telephone Encounter (Signed)
FYI: Pt states he has some feet and leg swelling that happens on and off for a couple of weeks now with No weight gain. Pt states he is taking 40 mg of Lasix instead of the 20 mg that is prescribed for two weeks.  Please advise if further instructions need to be given.

## 2013-07-20 ENCOUNTER — Encounter: Payer: Self-pay | Admitting: *Deleted

## 2013-07-20 ENCOUNTER — Other Ambulatory Visit: Payer: Self-pay | Admitting: *Deleted

## 2013-07-20 DIAGNOSIS — I10 Essential (primary) hypertension: Secondary | ICD-10-CM

## 2013-08-13 ENCOUNTER — Other Ambulatory Visit: Payer: Self-pay | Admitting: Cardiology

## 2013-10-05 ENCOUNTER — Telehealth: Payer: Self-pay | Admitting: *Deleted

## 2013-10-05 NOTE — Telephone Encounter (Signed)
FYI: Pt advised he has been taking 7m daily instead of the 461mpt was advised, pt scheduled for a surgical clearance OV, pt scheduled for 10-19-13 at 2pm

## 2013-10-05 NOTE — Telephone Encounter (Signed)
Pt wants to know if he should still be taking fluid pill when his feet swell.

## 2013-10-19 ENCOUNTER — Ambulatory Visit (INDEPENDENT_AMBULATORY_CARE_PROVIDER_SITE_OTHER): Payer: Medicare Other | Admitting: Cardiovascular Disease

## 2013-10-19 VITALS — BP 142/74 | HR 70 | Ht 75.0 in | Wt 280.0 lb

## 2013-10-19 DIAGNOSIS — Z9861 Coronary angioplasty status: Secondary | ICD-10-CM

## 2013-10-19 DIAGNOSIS — I7090 Unspecified atherosclerosis: Secondary | ICD-10-CM

## 2013-10-19 DIAGNOSIS — Z7901 Long term (current) use of anticoagulants: Secondary | ICD-10-CM

## 2013-10-19 DIAGNOSIS — Z01818 Encounter for other preprocedural examination: Secondary | ICD-10-CM

## 2013-10-19 DIAGNOSIS — I10 Essential (primary) hypertension: Secondary | ICD-10-CM

## 2013-10-19 DIAGNOSIS — I2510 Atherosclerotic heart disease of native coronary artery without angina pectoris: Secondary | ICD-10-CM

## 2013-10-19 DIAGNOSIS — I5032 Chronic diastolic (congestive) heart failure: Secondary | ICD-10-CM

## 2013-10-19 NOTE — Patient Instructions (Addendum)
Your physician recommends that you schedule a follow-up appointment in: [place] has requested that you have an echocardiogram. Echocardiography is a painless test that uses sound waves to create images of your heart. It provides your doctor with information about the size and shape of your heart and how well your heart's chambers and valves are working. This procedure takes approximately one hour. There are no restrictions for this procedure.  WE WILL CALL YOU WITH YOUR TEST RESULTS/INSTRUCTIONS/NEXT STEPS ONCE RECEIVED BY THE PROVIDER   Your physician has recommended you make the following change in your medication:   1) STOP TAKING BRILINTA 90MG ON 11-09-13 PRIOR TO SURGERY 2) Dr. Bronson Ing ADVISED THAT YOU CONTINUE TAKING YOUR ASPIRIN PER YOUR CARDIAC RISK DUE TO PREVIOUS STENT, A NOTATION WILL BE SENT TO YOUR SURGEON

## 2013-10-19 NOTE — Progress Notes (Signed)
Patient ID: [name], male   DOB: March 30, 1946, 67 y.o.   MRN: [id number]      SUBJECTIVE: [name] is a 67 yr old gentleman with long-standing hypertension and hypertensive heart disease as well as coronary artery disease, and experienced a NSTEMI on 11/16/2012 and received DES to the LAD and D1, with residual diffuse nonobstructive disease in the RCA and LCx. He has CKD, sleep apnea, obesity, and reportedly also has chronic diastolic heart failure. He would like to undergo right TKA for DJD, and eventually left TKA. He is scheduled to undergo surgery on 11/16/13. He has been instructed by orthopedics to stop ASA 4 days beforehand.  He reportedly used to weigh 330 lbs, and now weighs 280 lbs. He now eats much better and exercises for 45 minutes a day 4 days a week. The patient denies any symptoms of chest pain, palpitations, shortness of breath, lightheadedness, dizziness, leg swelling, orthopnea, PND, and syncope. He does have b/l knee swelling.  He would like to get back to swimming, and currently uses an Elliptical.  His wife passed away 4 years ago from lupus.     Allergies  Allergen Reactions  . Niacin And Related Other (See Comments)    Reaction: flushing and burning side effects even with extended release    Current Outpatient Prescriptions  Medication Sig Dispense Refill  . allopurinol (ZYLOPRIM) 300 MG tablet Take 300 mg by mouth daily.      Marland Kitchen ALPRAZolam (XANAX) 1 MG tablet Take 0.5-1 mg by mouth at bedtime. For anxiety      . aspirin 81 MG chewable tablet Chew 81 mg by mouth daily.      . furosemide (LASIX) 40 MG tablet Take 20 mg by mouth daily.      Marland Kitchen gabapentin (NEURONTIN) 300 MG capsule Take 300 mg by mouth 2 (two) times daily.       Marland Kitchen HYDROcodone-acetaminophen (NORCO/VICODIN) 5-325 MG per tablet Take 2 tablets by mouth every 4 (four) hours as needed for pain.  6 tablet  0  . metoprolol (LOPRESSOR) 50 MG tablet TAKE ONE TABLET TWICE DAILY  60 tablet  11  .  NITROSTAT 0.4 MG SL tablet Place 0.4 mg under the tongue every 5 (five) minutes as needed.       . Ticagrelor (BRILINTA) 90 MG TABS tablet Take 1 tablet (90 mg total) by mouth 2 (two) times daily.  32 tablet  0  . amLODipine (NORVASC) 10 MG tablet Take 10 mg by mouth daily.      . ONGLYZA 5 MG TABS tablet Take 5 mg by mouth daily.       . pravastatin (PRAVACHOL) 20 MG tablet Take 20 mg by mouth daily.      . predniSONE (DELTASONE) 5 MG tablet Take 5-30 mg by mouth daily. Takes 6 tablets daily for 3 days, then take 5 tablets for 2 days, then take 4 tablets for 2 days, then take 3 tablets for 2 days, then take 2 tablets for 2 days, then take 1 tablet for 2 days       No current facility-administered medications for this visit.    Past Medical History  Diagnosis Date  . Diastolic heart failure     Pedal edema  . Hypertension     Exercise induced  . NSVT (nonsustained ventricular tachycardia)     Exercise induced  . 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 Obesity   .  Degenerative joint disease   . Benign prostatic hypertrophy     Nocturia  . Erectile dysfunction   . High cholesterol   . Obstructive sleep apnea     Probable sleep study to be done  . CAD (coronary artery disease)     a.  NSTEMI 10/2012 s/p DES to LAD & DES to 1st diagonal with residual diffuse nonobstructive dz in LCx/RCA   . Chronic kidney disease, stage 3, mod decreased GFR     Creatinine of 1.5 in 03/2009  . Diabetes mellitus, type II 12/10/2012.  Marland Kitchen Anemia     Past Surgical History  Procedure Laterality Date  . Back surgery      History   Social History  . Marital Status: Widowed    Spouse Name: N/A    Number of Children: N/A  . Years of Education: N/A   Occupational History  . Disabled    Social History Main Topics  . Smoking status: Former Smoker -- 1.00 packs/day for 30 years    Quit date: 12/23/1992  . Smokeless tobacco: Not on file  . Alcohol Use: No  . Drug Use: No  .  Sexual Activity: Not on file   Other Topics Concern  . Not on file   Social History Narrative   Married     Filed Vitals:   10/19/13 1431  BP: 142/74  Pulse: 70  Height: 6' 3"  (1.905 m)  Weight: 280 lb (127.007 kg)    PHYSICAL EXAM General: NAD Neck: No JVD, no thyromegaly or thyroid nodule.  Lungs: Clear to auscultation bilaterally with normal respiratory effort. CV: Nondisplaced PMI.  Heart regular S1/S2, no S3/S4, no murmur.  No peripheral edema.  No carotid bruit.  Normal pedal pulses.  Abdomen: Soft, nontender, no hepatosplenomegaly, no distention.  Neurologic: Alert and oriented x 3.  Psych: Normal affect. Extremities: No clubbing or cyanosis. Bilateral knee swelling.  ECG: reviewed and available in electronic records. (sinus rhythm with 1st degree AV block, PR 234 ms, LVH with repolarization changes).    ASSESSMENT AND PLAN: 1. Preoperative risk stratification: he appears to be at least at moderate risk for a major adverse cardiac event, given the extent of his CAD and a NSTEMI in 10/2013. I would recommend not stopping ASA throughout the perioperative period, along with his metoprolol, in order to attenuate this risk. I also recommend continuing pravastatin. He is currently on Brilinta and was instructed by Dr. Burt Knack o take dual antiplatelet therapy for one year. 11/16/13 will be one year, but his surgery is scheduled for 11/24. I have asked him to stop Brilinta on 11/18. Again, I recommend continuing ASA if at all possible. In order to further evaluate his risk, I will obtain an echocardiogram. 2. CAD s/p PCI of LAD and D1: symptomatically stable with what appears to be good exercise tolerance. Continue present therapy as noted in #1. 3. HTN: no changes in therapy at this time. 4. Hyperlipidemia: continue pravastatin. 5. Chronic diastolic heart failure: appears to be compensated. No changes in diuretic regimen.  Kate Sable, M.D., F.A.C.C.

## 2013-10-26 ENCOUNTER — Ambulatory Visit (HOSPITAL_COMMUNITY)
Admission: RE | Admit: 2013-10-26 | Discharge: 2013-10-26 | Disposition: A | Discharge status: Home / Self Care | Payer: Medicare Other | Source: Ambulatory Visit | Attending: Cardiovascular Disease | Admitting: Cardiovascular Disease

## 2013-10-26 DIAGNOSIS — Z6835 Body mass index (BMI) 35.0-35.9, adult: Secondary | ICD-10-CM | POA: Insufficient documentation

## 2013-10-26 DIAGNOSIS — E669 Obesity, unspecified: Secondary | ICD-10-CM | POA: Insufficient documentation

## 2013-10-26 DIAGNOSIS — I10 Essential (primary) hypertension: Secondary | ICD-10-CM | POA: Insufficient documentation

## 2013-10-26 DIAGNOSIS — I517 Cardiomegaly: Secondary | ICD-10-CM

## 2013-10-26 DIAGNOSIS — I2510 Atherosclerotic heart disease of native coronary artery without angina pectoris: Secondary | ICD-10-CM | POA: Insufficient documentation

## 2013-10-26 DIAGNOSIS — Z01810 Encounter for preprocedural cardiovascular examination: Secondary | ICD-10-CM | POA: Insufficient documentation

## 2013-10-26 DIAGNOSIS — E119 Type 2 diabetes mellitus without complications: Secondary | ICD-10-CM | POA: Insufficient documentation

## 2013-10-26 DIAGNOSIS — Z01818 Encounter for other preprocedural examination: Secondary | ICD-10-CM

## 2013-10-26 NOTE — Progress Notes (Signed)
*  PRELIMINARY RESULTS* Echocardiogram 2D Echocardiogram has been performed.  [place], [place] 10/26/2013, 2:56 PM

## 2013-11-04 ENCOUNTER — Other Ambulatory Visit: Payer: Self-pay | Admitting: *Deleted

## 2013-11-04 MED ORDER — TICAGRELOR 90 MG PO TABS: 90.0000 mg | ORAL_TABLET | Freq: Two times a day (BID) | ORAL | Status: DC

## 2013-11-08 ENCOUNTER — Encounter (HOSPITAL_COMMUNITY)
Admission: RE | Admit: 2013-11-08 | Discharge: 2013-11-08 | Disposition: A | Discharge status: Home / Self Care | Payer: Medicare Other | Source: Ambulatory Visit | Attending: Orthopedic Surgery | Admitting: Orthopedic Surgery

## 2013-11-08 ENCOUNTER — Encounter (HOSPITAL_COMMUNITY): Payer: Self-pay | Admitting: Pharmacy Technician

## 2013-11-08 ENCOUNTER — Encounter (HOSPITAL_COMMUNITY): Payer: Self-pay

## 2013-11-08 ENCOUNTER — Ambulatory Visit (HOSPITAL_COMMUNITY)
Admission: RE | Admit: 2013-11-08 | Discharge: 2013-11-08 | Disposition: A | Discharge status: Home / Self Care | Payer: Medicare Other | Source: Ambulatory Visit | Attending: Orthopedic Surgery | Admitting: Orthopedic Surgery

## 2013-11-08 DIAGNOSIS — Z87891 Personal history of nicotine dependence: Secondary | ICD-10-CM | POA: Insufficient documentation

## 2013-11-08 DIAGNOSIS — Z01818 Encounter for other preprocedural examination: Secondary | ICD-10-CM | POA: Insufficient documentation

## 2013-11-08 DIAGNOSIS — E119 Type 2 diabetes mellitus without complications: Secondary | ICD-10-CM | POA: Insufficient documentation

## 2013-11-08 DIAGNOSIS — M1710 Unilateral primary osteoarthritis, unspecified knee: Secondary | ICD-10-CM | POA: Insufficient documentation

## 2013-11-08 DIAGNOSIS — Z01812 Encounter for preprocedural laboratory examination: Secondary | ICD-10-CM | POA: Insufficient documentation

## 2013-11-08 DIAGNOSIS — I5030 Unspecified diastolic (congestive) heart failure: Secondary | ICD-10-CM | POA: Insufficient documentation

## 2013-11-08 DIAGNOSIS — I10 Essential (primary) hypertension: Secondary | ICD-10-CM | POA: Insufficient documentation

## 2013-11-08 HISTORY — DX: Major depressive disorder, single episode, unspecified: F32.9

## 2013-11-08 HISTORY — DX: Anxiety disorder, unspecified: F41.9

## 2013-11-08 HISTORY — DX: Influenza due to unidentified influenza virus with other respiratory manifestations: J11.1

## 2013-11-08 HISTORY — DX: Depression, unspecified: F32.A

## 2013-11-08 LAB — CBC
HCT: 30.4 % — ABNORMAL LOW (ref 39.0–52.0)
Hemoglobin: 9.5 g/dL — ABNORMAL LOW (ref 13.0–17.0)
MCH: 29.4 pg (ref 26.0–34.0)
MCHC: 31.3 g/dL (ref 30.0–36.0)
Platelets: 356 10*3/uL (ref 150–400)
RDW: 17.1 % — ABNORMAL HIGH (ref 11.5–15.5)

## 2013-11-08 LAB — BASIC METABOLIC PANEL
BUN: 40 mg/dL — ABNORMAL HIGH (ref 6–23)
Calcium: 10.2 mg/dL (ref 8.4–10.5)
Chloride: 101 mEq/L (ref 96–112)
GFR calc Af Amer: 48 mL/min — ABNORMAL LOW (ref >90)
GFR calc non Af Amer: 41 mL/min — ABNORMAL LOW (ref >90)
Glucose, Bld: 93 mg/dL (ref 70–99)
Potassium: 3.8 mEq/L (ref 3.5–5.1)

## 2013-11-08 LAB — URINALYSIS, ROUTINE W REFLEX MICROSCOPIC
Bilirubin Urine: NEGATIVE
Glucose, UA: NEGATIVE mg/dL
Hgb urine dipstick: NEGATIVE
Ketones, ur: NEGATIVE mg/dL
Nitrite: NEGATIVE
Specific Gravity, Urine: 1.013 (ref 1.005–1.030)
pH: 6 (ref 5.0–8.0)

## 2013-11-08 LAB — PROTIME-INR
INR: 1.09 (ref 0.00–1.49)
Prothrombin Time: 13.9 seconds (ref 11.6–15.2)

## 2013-11-08 LAB — SURGICAL PCR SCREEN
MRSA, PCR: NEGATIVE
Staphylococcus aureus: NEGATIVE

## 2013-11-08 NOTE — Progress Notes (Signed)
EKG 10/19/13 on EPIC, surgery clearance note Dr. [name] on chart, LOV note 10/13/13 Dr. [name] on chart, cardiac clearance 10/19/13 Dr. Bronson Ing on chart, note from catheterization 2013 on chart

## 2013-11-08 NOTE — Progress Notes (Signed)
11/08/13 1356  OBSTRUCTIVE SLEEP APNEA  Have you ever been diagnosed with sleep apnea through a sleep study? No  Do you snore loudly (loud enough to be heard through closed doors)?  1  Do you often feel tired, fatigued, or sleepy during the daytime? 0  Has anyone observed you stop breathing during your sleep? 0  Do you have, or are you being treated for high blood pressure? 1  BMI more than 35 kg/m2? 1  Age over 68 years old? 1  Neck circumference greater than 40 cm/18 inches? 0  Gender: 1  Obstructive Sleep Apnea Score 5  Score 4 or greater  Results sent to PCP

## 2013-11-08 NOTE — Patient Instructions (Signed)
[place]  11/08/2013   Your procedure is scheduled on: 11/15/13  Report to [facility] at 10:30 AM.  Call this number if you have problems the morning of surgery [phone]   Remember: please call me with medication list   Do not eat food or drink liquids After Midnight.     Take these medicines the morning of surgery with A SIP OF WATER: amlodipine, gabapentin, metoprolol, pravastatin, prednisone   Do not wear jewelry, make-up or nail polish.  Do not wear lotions, powders, or perfumes. You may wear deodorant.  Do not shave 48 hours prior to surgery. Men may shave face and neck.  Do not bring valuables to the hospital.  Contacts, dentures or bridgework may not be worn into surgery.  Leave suitcase in the car. After surgery it may be brought to your room.  For patients admitted to the hospital, checkout time is 11:00 AM the day of discharge.    Please read over the following fact sheets that you were given: MRSA Information, incentive spirometry fact sheet, blood fact sheet Paulette Blanch, RN  pre op nurse call if needed [phone]    FAILURE TO [place]   Patient Signature: ___________________________________________

## 2013-11-11 NOTE — H&P (Signed)
TOTAL KNEE ADMISSION H&P  Patient is being admitted for right total knee arthroplasty.  Subjective:  Chief Complaint:    Right knee OA / pain.  HPI: [name], 67 y.o. male, has a history of pain and functional disability in the right knee due to arthritis and has failed non-surgical conservative treatments for greater than 12 weeks to includeNSAID's and/or analgesics, corticosteriod injections, use of assistive devices, weight reduction as appropriate and activity modification.  Onset of symptoms was gradual, starting 1+ years ago with rapidlly worsening course for the last 4-5 months. The patient noted no past surgery on the right knee(s).  Patient currently rates pain in the right knee(s) at 9 out of 10 with activity. Patient has night pain, worsening of pain with activity and weight bearing, pain that interferes with activities of daily living, pain with passive range of motion, crepitus and joint swelling.  Patient has evidence of periarticular osteophytes and joint space narrowing by imaging studies.  There is no active infection.  Risks, benefits and expectations were discussed with the patient.  Risks including but not limited to the risk of anesthesia, blood clots, nerve damage, blood vessel damage, failure of the prosthesis, infection and up to and including death.  Patient understand the risks, benefits and expectations and wishes to proceed with surgery.   D/C Plans:      SNF   Post-op Meds:     No Rx given  Tranexamic Acid:   Not to be given - CAD with previous MI  Decadron:    Not to be given - DM  FYI:    Brilinta with ASA pre-op  Norco post-op  Patient Active Problem List   Diagnosis Date Noted  . Anemia, normocytic normochromic 01/12/2013  . Diabetes mellitus 12/10/2012  . 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 Overweight(278.02) 11/17/2012  . Obstructive sleep apnea 11/15/2012  . Chronic kidney disease, stage 3, cardiorenal syndrome  08/23/2011  . Hypertension    Past Medical History  Diagnosis Date  . Diastolic heart failure     Pedal edema, "a long time ago"  . Hypertension     Exercise induced  . NSVT (nonsustained ventricular tachycardia)     Exercise induced  . 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 Obesity   . Degenerative joint disease   . Benign prostatic hypertrophy     Nocturia  . Erectile dysfunction   . High cholesterol   . CAD (coronary artery disease)     a.  NSTEMI 10/2012 s/p DES to LAD & DES to 1st diagonal with residual diffuse nonobstructive dz in LCx/RCA   . Chronic kidney disease, stage 3, mod decreased GFR     Creatinine of 1.5 in 03/2009  . Diabetes mellitus, type II 12/10/2012.  Marland Kitchen Anxiety   . Depression     hx of  . Flu 2013    hx of    Past Surgical History  Procedure Laterality Date  . Cardiac catheterization      with stent placement  . Back surgery  2007    x2  . Hernia repair      No prescriptions prior to admission   Allergies  Allergen Reactions  . Niacin And Related Other (See Comments)    Reaction: flushing and burning side effects even with extended release    History  Substance Use Topics  . Smoking status: Former Smoker -- 1.00 packs/day for 30 years  Types: Cigarettes    Quit date: 12/23/1992  . Smokeless tobacco: Never Used  . Alcohol Use: No    Family History  Problem Relation Age of Onset  . Hypertension Other   . Cancer Other   . Heart disease    . Diabetes       Review of Systems  Constitutional: Negative.   HENT: Negative.   Eyes: Negative.   Respiratory: Negative.   Cardiovascular: Positive for leg swelling.  Gastrointestinal: Negative.   Genitourinary: Negative.   Musculoskeletal: Positive for joint pain.  Skin: Negative.   Neurological: Negative.   Endo/Heme/Allergies: Negative.   Psychiatric/Behavioral: Positive for depression. The patient is nervous/anxious.     Objective:  Physical Exam   Constitutional: He is oriented to person, place, and time. He appears well-developed and well-nourished.  HENT:  Head: Normocephalic and atraumatic.  Eyes: Pupils are equal, round, and reactive to light.  Neck: Neck supple. No JVD present. No tracheal deviation present. No thyromegaly present.  Cardiovascular: Normal rate, regular rhythm and intact distal pulses.   Respiratory: Effort normal and breath sounds normal. No respiratory distress. He has no wheezes.  GI: Soft. There is no tenderness. There is no guarding.  Musculoskeletal:       Right knee: He exhibits decreased range of motion, swelling and bony tenderness. He exhibits no effusion, no ecchymosis, no deformity, no laceration and no erythema. Tenderness found.  Lymphadenopathy:    He has no cervical adenopathy.  Neurological: He is alert and oriented to person, place, and time.  Skin: Skin is warm and dry.  Psychiatric: He has a normal mood and affect.    Labs:  Estimated body mass index is 35.42 kg/(m^2) as calculated from the following:   Height as of 06/15/13: 6' 2"  (1.88 m).   Weight as of 06/15/13: 125.193 kg (276 lb).   Imaging Review Plain radiographs demonstrate severe degenerative joint disease of the right knee(s). The overall alignment isneutral. The bone quality appears to be good for age and reported activity level.  Assessment/Plan:  End stage arthritis, right knee   The patient history, physical examination, clinical judgment of the provider and imaging studies are consistent with end stage degenerative joint disease of the right knee(s) and total knee arthroplasty is deemed medically necessary. The treatment options including medical management, injection therapy arthroscopy and arthroplasty were discussed at length. The risks and benefits of total knee arthroplasty were presented and reviewed. The risks due to aseptic loosening, infection, stiffness, patella tracking problems, thromboembolic complications and  other imponderables were discussed. The patient acknowledged the explanation, agreed to proceed with the plan and consent was signed. Patient is being admitted for inpatient treatment for surgery, pain control, PT, OT, prophylactic antibiotics, VTE prophylaxis, progressive ambulation and ADL's and discharge planning. The patient is planning to be discharged to skilled nursing facility.     [place] Pugh Nikola [place]   PAC  11/11/2013, 6:03 PM

## 2013-11-12 ENCOUNTER — Encounter (HOSPITAL_COMMUNITY): Payer: Self-pay | Admitting: Pharmacist

## 2013-11-12 NOTE — Progress Notes (Signed)
Spoke to [employer] ,Software engineer, and he will call patient at home tonight to complete med reconciliation.

## 2013-11-12 NOTE — Progress Notes (Signed)
Pt left multiple messages to return call to pharmacy and review medications, no answer 11-12-13 and unable to leave voicemail message.

## 2013-11-12 NOTE — Progress Notes (Signed)
Pt left message on unit voicemail, he will bring all medications 11-15-13 for pharamacy to review.

## 2013-11-14 MED ORDER — DEXTROSE 5 % IV SOLN
3.0000 g | INTRAVENOUS | Status: AC
  Administered 2013-11-15: 3 g via INTRAVENOUS
  Filled 2013-11-14 (×2): qty 3000

## 2013-11-15 ENCOUNTER — Inpatient Hospital Stay (HOSPITAL_COMMUNITY)
Admission: RE | Admit: 2013-11-15 | Discharge: 2013-11-18 | DRG: 470 | Disposition: A | Discharge status: Skilled Nursing Facility | Payer: Medicare Other | Source: Ambulatory Visit | Attending: Orthopedic Surgery | Admitting: Orthopedic Surgery

## 2013-11-15 ENCOUNTER — Inpatient Hospital Stay (HOSPITAL_COMMUNITY): Payer: Medicare Other | Admitting: Registered Nurse

## 2013-11-15 ENCOUNTER — Encounter (HOSPITAL_COMMUNITY): Payer: Self-pay | Admitting: *Deleted

## 2013-11-15 ENCOUNTER — Encounter (HOSPITAL_COMMUNITY)
Admission: RE | Disposition: A | Discharge status: Skilled Nursing Facility | Payer: Self-pay | Source: Ambulatory Visit | Attending: Orthopedic Surgery

## 2013-11-15 ENCOUNTER — Encounter (HOSPITAL_COMMUNITY): Payer: Medicare Other | Admitting: Registered Nurse

## 2013-11-15 DIAGNOSIS — E669 Obesity, unspecified: Secondary | ICD-10-CM | POA: Diagnosis present

## 2013-11-15 DIAGNOSIS — Z833 Family history of diabetes mellitus: Secondary | ICD-10-CM

## 2013-11-15 DIAGNOSIS — D62 Acute posthemorrhagic anemia: Secondary | ICD-10-CM | POA: Diagnosis not present

## 2013-11-15 DIAGNOSIS — E7800 Pure hypercholesterolemia, unspecified: Secondary | ICD-10-CM | POA: Diagnosis present

## 2013-11-15 DIAGNOSIS — I2510 Atherosclerotic heart disease of native coronary artery without angina pectoris: Secondary | ICD-10-CM | POA: Diagnosis present

## 2013-11-15 DIAGNOSIS — D500 Iron deficiency anemia secondary to blood loss (chronic): Secondary | ICD-10-CM | POA: Diagnosis not present

## 2013-11-15 DIAGNOSIS — Z6835 Body mass index (BMI) 35.0-35.9, adult: Secondary | ICD-10-CM

## 2013-11-15 DIAGNOSIS — Z9861 Coronary angioplasty status: Secondary | ICD-10-CM

## 2013-11-15 DIAGNOSIS — I472 Ventricular tachycardia, unspecified: Secondary | ICD-10-CM | POA: Diagnosis present

## 2013-11-15 DIAGNOSIS — I252 Old myocardial infarction: Secondary | ICD-10-CM

## 2013-11-15 DIAGNOSIS — Z87891 Personal history of nicotine dependence: Secondary | ICD-10-CM

## 2013-11-15 DIAGNOSIS — N183 Chronic kidney disease, stage 3 unspecified: Secondary | ICD-10-CM | POA: Diagnosis present

## 2013-11-15 DIAGNOSIS — E119 Type 2 diabetes mellitus without complications: Secondary | ICD-10-CM | POA: Diagnosis present

## 2013-11-15 DIAGNOSIS — I5032 Chronic diastolic (congestive) heart failure: Secondary | ICD-10-CM | POA: Diagnosis present

## 2013-11-15 DIAGNOSIS — M810 Age-related osteoporosis without current pathological fracture: Secondary | ICD-10-CM | POA: Diagnosis present

## 2013-11-15 DIAGNOSIS — G4733 Obstructive sleep apnea (adult) (pediatric): Secondary | ICD-10-CM | POA: Diagnosis present

## 2013-11-15 DIAGNOSIS — Z96651 Presence of right artificial knee joint: Secondary | ICD-10-CM

## 2013-11-15 DIAGNOSIS — I130 Hypertensive heart and chronic kidney disease with heart failure and stage 1 through stage 4 chronic kidney disease, or unspecified chronic kidney disease: Secondary | ICD-10-CM | POA: Diagnosis present

## 2013-11-15 DIAGNOSIS — F3289 Other specified depressive episodes: Secondary | ICD-10-CM | POA: Diagnosis present

## 2013-11-15 DIAGNOSIS — F329 Major depressive disorder, single episode, unspecified: Secondary | ICD-10-CM | POA: Diagnosis present

## 2013-11-15 DIAGNOSIS — Z96659 Presence of unspecified artificial knee joint: Secondary | ICD-10-CM

## 2013-11-15 DIAGNOSIS — N400 Enlarged prostate without lower urinary tract symptoms: Secondary | ICD-10-CM | POA: Diagnosis present

## 2013-11-15 DIAGNOSIS — I4729 Other ventricular tachycardia: Secondary | ICD-10-CM | POA: Diagnosis present

## 2013-11-15 DIAGNOSIS — Z8249 Family history of ischemic heart disease and other diseases of the circulatory system: Secondary | ICD-10-CM

## 2013-11-15 DIAGNOSIS — M1710 Unilateral primary osteoarthritis, unspecified knee: Principal | ICD-10-CM | POA: Diagnosis present

## 2013-11-15 DIAGNOSIS — F411 Generalized anxiety disorder: Secondary | ICD-10-CM | POA: Diagnosis present

## 2013-11-15 HISTORY — PX: TOTAL KNEE ARTHROPLASTY: SHX125

## 2013-11-15 LAB — GLUCOSE, CAPILLARY
Glucose-Capillary: 93 mg/dL (ref 70–99)
Glucose-Capillary: 89 mg/dL (ref 70–99)
Glucose-Capillary: 97 mg/dL (ref 70–99)
Glucose-Capillary: 93 mg/dL (ref 70–99)

## 2013-11-15 LAB — TYPE AND SCREEN

## 2013-11-15 SURGERY — ARTHROPLASTY, KNEE, TOTAL
Anesthesia: General | Site: Knee | Laterality: Right | Wound class: Clean

## 2013-11-15 MED ORDER — ROCURONIUM BROMIDE 100 MG/10ML IV SOLN
INTRAVENOUS | Status: DC | PRN
  Administered 2013-11-15: 30 mg via INTRAVENOUS

## 2013-11-15 MED ORDER — HYDROMORPHONE HCL PF 2 MG/ML IJ SOLN
INTRAMUSCULAR | Status: AC
  Administered 2013-11-15: 17:00:00 0.5 mg
  Filled 2013-11-15: qty 1

## 2013-11-15 MED ORDER — DIPHENHYDRAMINE HCL 25 MG PO CAPS: 25.0000 mg | ORAL_CAPSULE | Freq: Four times a day (QID) | ORAL | Status: DC | PRN

## 2013-11-15 MED ORDER — NEOSTIGMINE METHYLSULFATE 1 MG/ML IJ SOLN
INTRAMUSCULAR | Status: DC | PRN
  Administered 2013-11-15: 2 mg via INTRAVENOUS

## 2013-11-15 MED ORDER — ONDANSETRON HCL 4 MG/2ML IJ SOLN: 4.0000 mg | Freq: Four times a day (QID) | INTRAMUSCULAR | Status: DC | PRN

## 2013-11-15 MED ORDER — EPHEDRINE SULFATE 50 MG/ML IJ SOLN
INTRAMUSCULAR | Status: AC
  Filled 2013-11-15: qty 1

## 2013-11-15 MED ORDER — INSULIN ASPART 100 UNIT/ML ~~LOC~~ SOLN
0.0000 [IU] | Freq: Three times a day (TID) | SUBCUTANEOUS | Status: DC
  Administered 2013-11-16: 2 [IU] via SUBCUTANEOUS
  Administered 2013-11-17: 08:00:00 5 [IU] via SUBCUTANEOUS
  Administered 2013-11-17: 18:00:00 2 [IU] via SUBCUTANEOUS
  Administered 2013-11-18: 08:00:00 5 [IU] via SUBCUTANEOUS

## 2013-11-15 MED ORDER — GLYCOPYRROLATE 0.2 MG/ML IJ SOLN
INTRAMUSCULAR | Status: DC | PRN
  Administered 2013-11-15: 0.3 mg via INTRAVENOUS

## 2013-11-15 MED ORDER — HYDROMORPHONE HCL PF 1 MG/ML IJ SOLN
0.5000 mg | INTRAMUSCULAR | Status: DC | PRN
  Administered 2013-11-15: 21:00:00 1 mg via INTRAVENOUS
  Administered 2013-11-16: 0.5 mg via INTRAVENOUS
  Filled 2013-11-15 (×2): qty 1

## 2013-11-15 MED ORDER — LIDOCAINE HCL (CARDIAC) 20 MG/ML IV SOLN
INTRAVENOUS | Status: AC
  Filled 2013-11-15: qty 5

## 2013-11-15 MED ORDER — CELECOXIB 200 MG PO CAPS
200.0000 mg | ORAL_CAPSULE | Freq: Two times a day (BID) | ORAL | Status: DC
  Administered 2013-11-15 – 2013-11-18 (×6): 200 mg via ORAL
  Filled 2013-11-15 (×7): qty 1

## 2013-11-15 MED ORDER — SODIUM CHLORIDE 0.9 % IV SOLN
INTRAVENOUS | Status: DC
  Administered 2013-11-15 – 2013-11-17 (×3): via INTRAVENOUS
  Filled 2013-11-15 (×14): qty 1000

## 2013-11-15 MED ORDER — MIDAZOLAM HCL 2 MG/2ML IJ SOLN
INTRAMUSCULAR | Status: AC
  Filled 2013-11-15: qty 2

## 2013-11-15 MED ORDER — HYDROMORPHONE HCL PF 2 MG/ML IJ SOLN
INTRAMUSCULAR | Status: AC
  Filled 2013-11-15: qty 1

## 2013-11-15 MED ORDER — SODIUM CHLORIDE 0.9 % IJ SOLN
INTRAMUSCULAR | Status: AC
  Filled 2013-11-15: qty 50

## 2013-11-15 MED ORDER — SODIUM CHLORIDE 0.9 % IJ SOLN
INTRAMUSCULAR | Status: DC | PRN
  Administered 2013-11-15: 15:00:00

## 2013-11-15 MED ORDER — FENTANYL CITRATE 0.05 MG/ML IJ SOLN
INTRAMUSCULAR | Status: DC | PRN
  Administered 2013-11-15 (×4): 50 ug via INTRAVENOUS

## 2013-11-15 MED ORDER — GABAPENTIN 300 MG PO CAPS
300.0000 mg | ORAL_CAPSULE | Freq: Two times a day (BID) | ORAL | Status: DC
  Administered 2013-11-15 – 2013-11-18 (×6): 300 mg via ORAL
  Filled 2013-11-15 (×7): qty 1

## 2013-11-15 MED ORDER — LIDOCAINE HCL (CARDIAC) 20 MG/ML IV SOLN
INTRAVENOUS | Status: DC | PRN
  Administered 2013-11-15: 100 mg via INTRAVENOUS

## 2013-11-15 MED ORDER — POLYETHYLENE GLYCOL 3350 17 G PO PACK
17.0000 g | PACK | Freq: Two times a day (BID) | ORAL | Status: DC
  Administered 2013-11-15 – 2013-11-18 (×6): 17 g via ORAL

## 2013-11-15 MED ORDER — ONDANSETRON HCL 4 MG/2ML IJ SOLN
INTRAMUSCULAR | Status: AC
  Filled 2013-11-15: qty 2

## 2013-11-15 MED ORDER — ALLOPURINOL 300 MG PO TABS
300.0000 mg | ORAL_TABLET | Freq: Every day | ORAL | Status: DC
Start: 2013-11-16 — End: 2013-11-18
  Administered 2013-11-16 – 2013-11-18 (×3): 300 mg via ORAL
  Filled 2013-11-15 (×3): qty 1

## 2013-11-15 MED ORDER — LACTATED RINGERS IV SOLN
INTRAVENOUS | Status: DC
  Administered 2013-11-15: 1000 mL via INTRAVENOUS
  Administered 2013-11-15: 16:00:00 via INTRAVENOUS

## 2013-11-15 MED ORDER — ONDANSETRON HCL 4 MG PO TABS: 4.0000 mg | ORAL_TABLET | Freq: Four times a day (QID) | ORAL | Status: DC | PRN

## 2013-11-15 MED ORDER — SUCCINYLCHOLINE CHLORIDE 20 MG/ML IJ SOLN
INTRAMUSCULAR | Status: AC
  Filled 2013-11-15: qty 1

## 2013-11-15 MED ORDER — SIMVASTATIN 10 MG PO TABS
10.0000 mg | ORAL_TABLET | Freq: Every day | ORAL | Status: DC
  Administered 2013-11-16 – 2013-11-17 (×2): 10 mg via ORAL
  Filled 2013-11-15 (×4): qty 1

## 2013-11-15 MED ORDER — LINAGLIPTIN 5 MG PO TABS
5.0000 mg | ORAL_TABLET | Freq: Every day | ORAL | Status: DC
  Filled 2013-11-15: qty 1

## 2013-11-15 MED ORDER — FLEET ENEMA 7-19 GM/118ML RE ENEM: 1.0000 | ENEMA | Freq: Once | RECTAL | Status: AC | PRN

## 2013-11-15 MED ORDER — MENTHOL 3 MG MT LOZG: 1.0000 | LOZENGE | OROMUCOSAL | Status: DC | PRN

## 2013-11-15 MED ORDER — ONDANSETRON HCL 4 MG/2ML IJ SOLN
INTRAMUSCULAR | Status: DC | PRN
  Administered 2013-11-15: 4 mg via INTRAVENOUS

## 2013-11-15 MED ORDER — DOCUSATE SODIUM 100 MG PO CAPS
100.0000 mg | ORAL_CAPSULE | Freq: Two times a day (BID) | ORAL | Status: DC
  Administered 2013-11-15 – 2013-11-18 (×6): 100 mg via ORAL

## 2013-11-15 MED ORDER — NEOSTIGMINE METHYLSULFATE 1 MG/ML IJ SOLN
INTRAMUSCULAR | Status: AC
  Filled 2013-11-15: qty 10

## 2013-11-15 MED ORDER — RIVAROXABAN 10 MG PO TABS
10.0000 mg | ORAL_TABLET | ORAL | Status: DC
  Administered 2013-11-16 – 2013-11-18 (×3): 10 mg via ORAL
  Filled 2013-11-15 (×4): qty 1

## 2013-11-15 MED ORDER — KETOROLAC TROMETHAMINE 30 MG/ML IJ SOLN
INTRAMUSCULAR | Status: DC | PRN
  Administered 2013-11-15: 30 mg

## 2013-11-15 MED ORDER — SODIUM CHLORIDE 0.9 % IR SOLN
Status: DC | PRN
  Administered 2013-11-15: 1000 mL

## 2013-11-15 MED ORDER — KETOROLAC TROMETHAMINE 30 MG/ML IJ SOLN
INTRAMUSCULAR | Status: AC
  Filled 2013-11-15: qty 1

## 2013-11-15 MED ORDER — CEFAZOLIN SODIUM-DEXTROSE 2-3 GM-% IV SOLR
2.0000 g | Freq: Four times a day (QID) | INTRAVENOUS | Status: AC
  Administered 2013-11-15 – 2013-11-16 (×2): 2 g via INTRAVENOUS
  Filled 2013-11-15 (×2): qty 50

## 2013-11-15 MED ORDER — FERROUS SULFATE 325 (65 FE) MG PO TABS
325.0000 mg | ORAL_TABLET | Freq: Three times a day (TID) | ORAL | Status: DC
  Administered 2013-11-16 – 2013-11-18 (×7): 325 mg via ORAL
  Filled 2013-11-15 (×11): qty 1

## 2013-11-15 MED ORDER — ALUM & MAG HYDROXIDE-SIMETH 200-200-20 MG/5ML PO SUSP: 30.0000 mL | ORAL | Status: DC | PRN

## 2013-11-15 MED ORDER — HYDROCODONE-ACETAMINOPHEN 7.5-325 MG PO TABS
1.0000 | ORAL_TABLET | ORAL | Status: DC
  Administered 2013-11-15 – 2013-11-16 (×3): 2 via ORAL
  Filled 2013-11-15 (×3): qty 2

## 2013-11-15 MED ORDER — NITROGLYCERIN 0.4 MG SL SUBL: 0.4000 mg | SUBLINGUAL_TABLET | SUBLINGUAL | Status: DC | PRN

## 2013-11-15 MED ORDER — LACTATED RINGERS IV SOLN: INTRAVENOUS | Status: DC

## 2013-11-15 MED ORDER — ZOLPIDEM TARTRATE 5 MG PO TABS: 5.0000 mg | ORAL_TABLET | Freq: Every evening | ORAL | Status: DC | PRN

## 2013-11-15 MED ORDER — ALPRAZOLAM 0.5 MG PO TABS: 0.5000 mg | ORAL_TABLET | Freq: Two times a day (BID) | ORAL | Status: DC | PRN

## 2013-11-15 MED ORDER — ROCURONIUM BROMIDE 100 MG/10ML IV SOLN
INTRAVENOUS | Status: AC
  Filled 2013-11-15: qty 1

## 2013-11-15 MED ORDER — METHOCARBAMOL 100 MG/ML IJ SOLN
500.0000 mg | Freq: Four times a day (QID) | INTRAVENOUS | Status: DC | PRN
  Administered 2013-11-15: 500 mg via INTRAVENOUS
  Filled 2013-11-15: qty 5

## 2013-11-15 MED ORDER — FENTANYL CITRATE 0.05 MG/ML IJ SOLN
INTRAMUSCULAR | Status: AC
  Filled 2013-11-15: qty 2

## 2013-11-15 MED ORDER — AMLODIPINE BESYLATE 10 MG PO TABS
10.0000 mg | ORAL_TABLET | Freq: Every day | ORAL | Status: DC
  Administered 2013-11-18: 10:00:00 10 mg via ORAL
  Filled 2013-11-15 (×4): qty 1

## 2013-11-15 MED ORDER — BUPIVACAINE LIPOSOME 1.3 % IJ SUSP
20.0000 mL | Freq: Once | INTRAMUSCULAR | Status: DC
  Filled 2013-11-15: qty 20

## 2013-11-15 MED ORDER — METOCLOPRAMIDE HCL 5 MG/ML IJ SOLN: 5.0000 mg | Freq: Three times a day (TID) | INTRAMUSCULAR | Status: DC | PRN

## 2013-11-15 MED ORDER — PHENOL 1.4 % MT LIQD: 1.0000 | OROMUCOSAL | Status: DC | PRN

## 2013-11-15 MED ORDER — BUPIVACAINE-EPINEPHRINE PF 0.25-1:200000 % IJ SOLN
INTRAMUSCULAR | Status: DC | PRN
  Administered 2013-11-15: 25 mL

## 2013-11-15 MED ORDER — METHOCARBAMOL 500 MG PO TABS
500.0000 mg | ORAL_TABLET | Freq: Four times a day (QID) | ORAL | Status: DC | PRN
Start: 2013-11-15 — End: 2013-11-18
  Administered 2013-11-16 – 2013-11-17 (×4): 500 mg via ORAL
  Filled 2013-11-15 (×5): qty 1

## 2013-11-15 MED ORDER — LINAGLIPTIN 5 MG PO TABS
5.0000 mg | ORAL_TABLET | Freq: Every day | ORAL | Status: DC
  Administered 2013-11-16 – 2013-11-18 (×3): 5 mg via ORAL
  Filled 2013-11-15 (×3): qty 1

## 2013-11-15 MED ORDER — PROPOFOL 10 MG/ML IV BOLUS
INTRAVENOUS | Status: DC | PRN
  Administered 2013-11-15: 250 mg via INTRAVENOUS

## 2013-11-15 MED ORDER — HYDROMORPHONE HCL PF 1 MG/ML IJ SOLN
INTRAMUSCULAR | Status: DC | PRN
  Administered 2013-11-15 (×2): 0.5 mg via INTRAVENOUS

## 2013-11-15 MED ORDER — HYDROMORPHONE HCL PF 1 MG/ML IJ SOLN
INTRAMUSCULAR | Status: AC
  Filled 2013-11-15: qty 1

## 2013-11-15 MED ORDER — MIDAZOLAM HCL 5 MG/5ML IJ SOLN
INTRAMUSCULAR | Status: DC | PRN
  Administered 2013-11-15: 2 mg via INTRAVENOUS

## 2013-11-15 MED ORDER — HYDROMORPHONE HCL PF 1 MG/ML IJ SOLN
0.2500 mg | INTRAMUSCULAR | Status: DC | PRN
  Administered 2013-11-15 (×2): 0.5 mg via INTRAVENOUS

## 2013-11-15 MED ORDER — PROPOFOL 10 MG/ML IV BOLUS
INTRAVENOUS | Status: AC
  Filled 2013-11-15: qty 20

## 2013-11-15 MED ORDER — METOCLOPRAMIDE HCL 10 MG PO TABS: 5.0000 mg | ORAL_TABLET | Freq: Three times a day (TID) | ORAL | Status: DC | PRN

## 2013-11-15 MED ORDER — PROMETHAZINE HCL 25 MG/ML IJ SOLN: 6.2500 mg | INTRAMUSCULAR | Status: DC | PRN

## 2013-11-15 MED ORDER — BISACODYL 10 MG RE SUPP: 10.0000 mg | Freq: Every day | RECTAL | Status: DC | PRN

## 2013-11-15 MED ORDER — BUPIVACAINE-EPINEPHRINE PF 0.25-1:200000 % IJ SOLN
INTRAMUSCULAR | Status: AC
  Filled 2013-11-15: qty 30

## 2013-11-15 MED ORDER — GLYCOPYRROLATE 0.2 MG/ML IJ SOLN
INTRAMUSCULAR | Status: AC
  Filled 2013-11-15: qty 2

## 2013-11-15 MED ORDER — 0.9 % SODIUM CHLORIDE (POUR BTL) OPTIME
TOPICAL | Status: DC | PRN
  Administered 2013-11-15: 1000 mL

## 2013-11-15 MED ORDER — SUCCINYLCHOLINE CHLORIDE 20 MG/ML IJ SOLN
INTRAMUSCULAR | Status: DC | PRN
  Administered 2013-11-15: 100 mg via INTRAVENOUS

## 2013-11-15 MED ORDER — FUROSEMIDE 40 MG PO TABS
40.0000 mg | ORAL_TABLET | Freq: Every day | ORAL | Status: DC
  Administered 2013-11-16 – 2013-11-18 (×3): 40 mg via ORAL
  Filled 2013-11-15 (×5): qty 1

## 2013-11-15 MED ORDER — SODIUM CHLORIDE 0.9 % IJ SOLN
INTRAMUSCULAR | Status: AC
  Filled 2013-11-15: qty 10

## 2013-11-15 MED ORDER — METOPROLOL TARTRATE 50 MG PO TABS
50.0000 mg | ORAL_TABLET | Freq: Two times a day (BID) | ORAL | Status: DC
  Administered 2013-11-15 – 2013-11-18 (×6): 50 mg via ORAL
  Filled 2013-11-15 (×7): qty 1

## 2013-11-15 SURGICAL SUPPLY — 64 items
ADH SKN CLS APL DERMABOND .7 (GAUZE/BANDAGES/DRESSINGS) ×1
AUG TIB SZ5 5 REV STP WDG STRL (Knees) ×2 IMPLANT
BAG SPEC THK2 15X12 ZIP CLS (MISCELLANEOUS) ×1
BAG ZIPLOCK 12X15 (MISCELLANEOUS) ×2 IMPLANT
BANDAGE ELASTIC 6 VELCRO ST LF (GAUZE/BANDAGES/DRESSINGS) ×2 IMPLANT
BANDAGE ESMARK 6X9 LF (GAUZE/BANDAGES/DRESSINGS) ×1 IMPLANT
BLADE SAW SGTL 13.0X1.19X90.0M (BLADE) ×2 IMPLANT
BNDG CMPR 9X6 STRL LF SNTH (GAUZE/BANDAGES/DRESSINGS) ×1
BNDG ESMARK 6X9 LF (GAUZE/BANDAGES/DRESSINGS) ×2
BONE CEMENT GENTAMICIN (Cement) ×6 IMPLANT
BOWL SMART MIX CTS (DISPOSABLE) ×2 IMPLANT
CAP UPCHARGE REVISION TRAY ×1 IMPLANT
CAPT RP KNEE ×1 IMPLANT
CEMENT BONE GENTAMICIN 40 (Cement) IMPLANT
CUFF TOURN SGL QUICK 34 (TOURNIQUET CUFF) ×2
CUFF TRNQT CYL 34X4X40X1 (TOURNIQUET CUFF) ×1 IMPLANT
DECANTER SPIKE VIAL GLASS SM (MISCELLANEOUS) ×2 IMPLANT
DERMABOND ADVANCED (GAUZE/BANDAGES/DRESSINGS) ×1
DERMABOND ADVANCED .7 DNX12 (GAUZE/BANDAGES/DRESSINGS) ×1 IMPLANT
DRAPE EXTREMITY T 121X128X90 (DRAPE) ×2 IMPLANT
DRAPE POUCH INSTRU U-SHP 10X18 (DRAPES) ×2 IMPLANT
DRAPE U-SHAPE 47X51 STRL (DRAPES) ×2 IMPLANT
DRSG AQUACEL AG ADV 3.5X10 (GAUZE/BANDAGES/DRESSINGS) ×2 IMPLANT
DRSG TEGADERM 4X4.75 (GAUZE/BANDAGES/DRESSINGS) IMPLANT
DURAPREP 26ML APPLICATOR (WOUND CARE) ×4 IMPLANT
ELECT REM PT RETURN 9FT ADLT (ELECTROSURGICAL) ×2
ELECTRODE REM PT RTRN 9FT ADLT (ELECTROSURGICAL) ×1 IMPLANT
EVACUATOR 1/8 PVC DRAIN (DRAIN) IMPLANT
FACESHIELD LNG OPTICON STERILE (SAFETY) ×10 IMPLANT
FEMUR SIGMA PS SZ 6.0 R (Femur) ×1 IMPLANT
GAUZE SPONGE 2X2 8PLY STRL LF (GAUZE/BANDAGES/DRESSINGS) IMPLANT
GLOVE BIOGEL PI IND STRL 7.5 (GLOVE) ×1 IMPLANT
GLOVE BIOGEL PI IND STRL 8 (GLOVE) ×1 IMPLANT
GLOVE BIOGEL PI INDICATOR 7.5 (GLOVE) ×1
GLOVE BIOGEL PI INDICATOR 8 (GLOVE) ×1
GLOVE ECLIPSE 8.0 STRL XLNG CF (GLOVE) ×2 IMPLANT
GLOVE ORTHO TXT STRL SZ7.5 (GLOVE) ×4 IMPLANT
GOWN BRE IMP PREV XXLGXLNG (GOWN DISPOSABLE) ×2 IMPLANT
GOWN PREVENTION PLUS LG XLONG (DISPOSABLE) ×2 IMPLANT
GOWN STRL NON-REIN LRG LVL3 (GOWN DISPOSABLE) ×1 IMPLANT
HANDPIECE INTERPULSE COAX TIP (DISPOSABLE) ×2
KIT BASIN OR (CUSTOM PROCEDURE TRAY) ×2 IMPLANT
MANIFOLD NEPTUNE II (INSTRUMENTS) ×2 IMPLANT
NDL SAFETY ECLIPSE 18X1.5 (NEEDLE) ×1 IMPLANT
NEEDLE HYPO 18GX1.5 SHARP (NEEDLE) ×2
NS IRRIG 1000ML POUR BTL (IV SOLUTION) ×2 IMPLANT
PACK TOTAL JOINT (CUSTOM PROCEDURE TRAY) ×2 IMPLANT
POSITIONER SURGICAL ARM (MISCELLANEOUS) ×2 IMPLANT
SET HNDPC FAN SPRY TIP SCT (DISPOSABLE) ×1 IMPLANT
SET PAD KNEE POSITIONER (MISCELLANEOUS) ×2 IMPLANT
SPONGE GAUZE 2X2 STER 10/PKG (GAUZE/BANDAGES/DRESSINGS)
SUCTION FRAZIER 12FR DISP (SUCTIONS) ×2 IMPLANT
SUT MNCRL AB 4-0 PS2 18 (SUTURE) ×2 IMPLANT
SUT VIC AB 1 CT1 36 (SUTURE) ×3 IMPLANT
SUT VIC AB 2-0 CT1 27 (SUTURE) ×6
SUT VIC AB 2-0 CT1 TAPERPNT 27 (SUTURE) ×3 IMPLANT
SUT VLOC 180 0 24IN GS25 (SUTURE) ×2 IMPLANT
SYR 50ML LL SCALE MARK (SYRINGE) ×2 IMPLANT
TOWEL OR 17X26 10 PK STRL BLUE (TOWEL DISPOSABLE) ×4 IMPLANT
TRAY FOLEY METER SIL LF 16FR (CATHETERS) ×1 IMPLANT
TRAY SLEEVE CEM ML (Knees) ×1 IMPLANT
WATER STERILE IRR 1500ML POUR (IV SOLUTION) ×3 IMPLANT
WEDGE SZ 5 5MM (Knees) ×2 IMPLANT
WRAP KNEE MAXI GEL POST OP (GAUZE/BANDAGES/DRESSINGS) ×2 IMPLANT

## 2013-11-15 NOTE — Anesthesia Preprocedure Evaluation (Addendum)
Anesthesia Evaluation  Patient identified by MRN, date of birth, ID band Patient awake    Reviewed: Allergy & Precautions, H&P , NPO status , Patient's Chart, lab work & pertinent test results, reviewed documented beta blocker date and time   Airway Mallampati: II TM Distance: >3 FB Neck ROM: full    Dental  (+) Chipped and Dental Advisory Given Chip left upper front:   Pulmonary neg pulmonary ROS, sleep apnea , former smoker,  breath sounds clear to auscultation  Pulmonary exam normal       Cardiovascular hypertension, Pt. on medications and Pt. on home beta blockers + CAD and + Cardiac Stents + dysrhythmias Ventricular Tachycardia Rhythm:regular Rate:Normal  Diastolic heart failure. NSVT. NSTEMI 11/13.  DES to LAD and 1st diagonal.   Neuro/Psych Back surgery with hardware negative neurological ROS  negative psych ROS   GI/Hepatic negative GI ROS, Neg liver ROS,   Endo/Other  negative endocrine ROSdiabetes, Well Controlled, Type 2, Oral Hypoglycemic AgentsMorbid obesity  Renal/GU negative Renal ROSStage 3 chronic renal disease  negative genitourinary   Musculoskeletal   Abdominal (+) + obese,   Peds  Hematology negative hematology ROS (+) anemia , hgb 9.5   Anesthesia Other Findings   Reproductive/Obstetrics negative OB ROS                      Anesthesia Physical Anesthesia Plan  ASA: III  Anesthesia Plan: General   Post-op Pain Management:    Induction: Intravenous  Airway Management Planned: Oral ETT  Additional Equipment:   Intra-op Plan:   Post-operative Plan: Extubation in OR  Informed Consent: I have reviewed the patients History and Physical, chart, labs and discussed the procedure including the risks, benefits and alternatives for the proposed anesthesia with the patient or authorized representative who has indicated his/her understanding and acceptance.   Dental Advisory  Given  Plan Discussed with: CRNA and Surgeon  Anesthesia Plan Comments:       Anesthesia Quick Evaluation

## 2013-11-15 NOTE — Progress Notes (Signed)
Pt called this morning to go over medicine to take this morning. He will take gabapentin, metoprolol, allopurinol, xanax if needed, and hydrocodone if needed with a sip of water. Pt states that he no longer takes amlodipine. Pt reminded not eat or drink anything. Pt agreed to arrive at short stay at 1030 am.

## 2013-11-15 NOTE — Anesthesia Postprocedure Evaluation (Signed)
Anesthesia Post Note  Patient: [name]  Procedure(s) Performed: Procedure(s) (LRB): RIGHT TOTAL KNEE ARTHROPLASTY (Right)  Anesthesia type: General  Patient location: PACU  Post pain: Pain level controlled  Post assessment: Post-op Vital signs reviewed  Last Vitals:  Filed Vitals:   11/15/13 1942  BP: 127/72  Pulse: 45  Temp: 36.4 C  Resp: 16    Post vital signs: Reviewed  Level of consciousness: sedated  Complications: No apparent anesthesia complications

## 2013-11-15 NOTE — Interval H&P Note (Signed)
History and Physical Interval Note:  11/15/2013 12:35 PM  [name]  has presented today for surgery, with the diagnosis of Osteoarthritis of the Right Knee  The various methods of treatment have been discussed with the patient and family. After consideration of risks, benefits and other options for treatment, the patient has consented to  Procedure(s): RIGHT TOTAL KNEE ARTHROPLASTY (Right) as a surgical intervention .  The patient's history has been reviewed, patient examined, no change in status, stable for surgery.  I have reviewed the patient's chart and labs.  Questions were answered to the patient's satisfaction.     Mauri Pole

## 2013-11-15 NOTE — Preoperative (Signed)
Beta Blockers   Reason not to administer Beta Blockers:Metoprolol taken 11-15-13 at [facility]

## 2013-11-15 NOTE — Transfer of Care (Signed)
Immediate Anesthesia Transfer of Care Note  Patient: [name]  Procedure(s) Performed: Procedure(s): RIGHT TOTAL KNEE ARTHROPLASTY (Right)  Patient Location: PACU  Anesthesia Type:General  Level of Consciousness: awake, alert  and oriented  Airway & Oxygen Therapy: Patient Spontanous Breathing and Patient connected to face mask oxygen  Post-op Assessment: Report given to PACU RN and Post -op Vital signs reviewed and stable  Post vital signs: Reviewed and stable  Complications: No apparent anesthesia complications

## 2013-11-15 NOTE — Brief Op Note (Signed)
11/15/2013  3:40 PM  PATIENT:  [name]  67 y.o. male  PRE-OPERATIVE DIAGNOSIS:  Osteoarthritis of the Right Knee  POST-OPERATIVE DIAGNOSIS:  Osteoarthritis of the Right Knee  PROCEDURE:  Procedure(s): RIGHT TOTAL KNEE ARTHROPLASTY (Right)  SURGEON:  Surgeon(s) and Role:    * Mauri Pole, MD - Primary  PHYSICIAN ASSISTANT: Danae Orleans, PA-C  ANESTHESIA:   general  EBL:  Total I/O In: 2000 [I.V.:2000] Out: 250 [Urine:200; Blood:50]  BLOOD ADMINISTERED:none  DRAINS: none  LOCAL MEDICATIONS USED:  OTHER exparel/marcaine combination  SPECIMEN:  No Specimen  DISPOSITION OF SPECIMEN:  N/A  COUNTS:  YES  TOURNIQUET:   Total Tourniquet Time Documented: Thigh (Right) - 60 minutes Total: Thigh (Right) - 60 minutes   DICTATION: .Other Dictation: Dictation Number [id number]  PLAN OF CARE: Admit to inpatient   PATIENT DISPOSITION:  PACU - hemodynamically stable.   Delay start of Pharmacological VTE agent (>24hrs) due to surgical blood loss or risk of bleeding: no

## 2013-11-16 ENCOUNTER — Encounter (HOSPITAL_COMMUNITY): Payer: Self-pay | Admitting: Orthopedic Surgery

## 2013-11-16 LAB — BASIC METABOLIC PANEL
BUN: 26 mg/dL — ABNORMAL HIGH (ref 6–23)
CO2: 24 mEq/L (ref 19–32)
Chloride: 107 mEq/L (ref 96–112)
Glucose, Bld: 149 mg/dL — ABNORMAL HIGH (ref 70–99)
Potassium: 4.3 mEq/L (ref 3.5–5.1)
Sodium: 140 mEq/L (ref 135–145)

## 2013-11-16 LAB — CBC
HCT: 22.1 % — ABNORMAL LOW (ref 39.0–52.0)
Hemoglobin: 7 g/dL — ABNORMAL LOW (ref 13.0–17.0)
Platelets: 194 10*3/uL (ref 150–400)
RBC: 2.32 MIL/uL — ABNORMAL LOW (ref 4.22–5.81)
WBC: 7.5 10*3/uL (ref 4.0–10.5)

## 2013-11-16 LAB — GLUCOSE, CAPILLARY
Glucose-Capillary: 110 mg/dL — ABNORMAL HIGH (ref 70–99)
Glucose-Capillary: 95 mg/dL (ref 70–99)
Glucose-Capillary: 132 mg/dL — ABNORMAL HIGH (ref 70–99)

## 2013-11-16 MED ORDER — OXYCODONE HCL 5 MG PO TABS
5.0000 mg | ORAL_TABLET | ORAL | Status: DC | PRN
  Administered 2013-11-16 (×4): 10 mg via ORAL
  Administered 2013-11-16: 10:00:00 5 mg via ORAL
  Administered 2013-11-17 – 2013-11-18 (×7): 10 mg via ORAL
  Filled 2013-11-16: qty 3
  Filled 2013-11-16 (×2): qty 2
  Filled 2013-11-16: qty 1
  Filled 2013-11-16 (×9): qty 2

## 2013-11-16 MED ORDER — SODIUM CHLORIDE 0.9 % IV BOLUS (SEPSIS)
500.0000 mL | Freq: Once | INTRAVENOUS | Status: AC
  Administered 2013-11-16: 13:00:00 500 mL via INTRAVENOUS

## 2013-11-16 NOTE — Progress Notes (Signed)
[facility] NOTE  Pharmacy received the following consult on 11/24 ... "Pt stated that they were on Brilinta and ASA at home.  Neither show on his home med reconciliation. Please check on medication and exchange with Xarelto if appropriate"  Upon communication with patient, he took his last dose of Brilinta on 11/09/2013 and is now completely off of [place] per cardiologist recommendation. Pt completed 1 year of therapy as recommended.    Pt was on ASA 81m po daily prior to admission and last dose was 11/23. Med Rec now adjusted.  Recommend to continue Xarelto for VTE prophylaxis.   TVanessa La Puerta PharmD, BCPS Pager: [phone]:54 AM Pharmacy #: [phone]

## 2013-11-16 NOTE — Progress Notes (Signed)
Patient ID: [name], male   DOB: 09/07/1946, 67 y.o.   MRN: [id number] Subjective: 1 Day Post-Op Procedure(s) (LRB): RIGHT TOTAL KNEE ARTHROPLASTY (Right)    Patient reports pain as moderate.  But doing OK, no events, expecting SNF ([facility]) at discharge  Objective:   VITALS:   Filed Vitals:   11/16/13 0559  BP: 133/78  Pulse: 56  Temp: 98.3 F (36.8 C)  Resp: 16    Neurovascular intact Incision: dressing C/D/I Ice on knee this am  LABS  Recent Labs  11/16/13 0425  HGB 7.0*  HCT 22.1*  WBC 7.5  PLT 194     Recent Labs  11/16/13 0425  NA 140  K 4.3  BUN 26*  CREATININE 1.31  GLUCOSE 149*    No results found for this basename: LABPT, INR,  in the last 72 hours   Assessment/Plan: 1 Day Post-Op Procedure(s) (LRB): RIGHT TOTAL KNEE ARTHROPLASTY (Right)   Advance diet Up with therapy Discharge to SNF hopefully Thursday depending on accepting policies on holiday

## 2013-11-16 NOTE — Evaluation (Signed)
Physical Therapy Evaluation Patient Details Name: [name] MRN: [id number] DOB: 1946-05-31 Today's Date: 11/16/2013 Time: 4098-1191 PT Time Calculation (min): 31 min  PT Assessment / Plan / Recommendation History of Present Illness  s/p right TKA  Clinical Impression  Pt will benefit from PT to address deficits below; Will likely need STSNF to allow return to I; Pt with Hgb 7.0 today and low BP; asymptomatic during PT    PT Assessment  Patient needs continued PT services    Follow Up Recommendations  SNF    Does the patient have the potential to tolerate intense rehabilitation      Barriers to Discharge        Equipment Recommendations  None recommended by PT    Recommendations for Other Services     Frequency 7X/week    Precautions / Restrictions Precautions Precautions: Knee Restrictions Other Position/Activity Restrictions: WBAT   Pertinent Vitals/Pain Minimal c/o pain; premedicated      Mobility  Bed Mobility Bed Mobility: Supine to Sit Supine to Sit: 1: +2 Total assist Supine to Sit: Patient Percentage: 40% Details for Bed Mobility Assistance: multi-modal cues for technique; +2 for trunk and LEs Transfers Transfers: Sit to Stand;Stand to Sit Sit to Stand: 1: +2 Total assist;From elevated surface Sit to Stand: Patient Percentage: 40% Stand to Sit: 1: +2 Total assist;To chair/3-in-1 Stand to Sit: Patient Percentage: 40% Details for Transfer Assistance: +2 for  wt shift to stand and to control descent; verbal cues and assist for hand placement and RLE positon Ambulation/Gait Ambulation/Gait Assistance: 1: +2 Total assist Ambulation/Gait: Patient Percentage: 70% Ambulation Distance (Feet): 38 Feet Assistive device: Rolling walker Gait Pattern: Step-to pattern;Antalgic    Exercises Total Joint Exercises Ankle Circles/Pumps: AROM;Both;10 reps Quad Sets: AROM;Both;10 reps   PT Diagnosis: Difficulty walking  PT Problem List: Decreased  strength;Decreased range of motion;Decreased activity tolerance;Decreased mobility;Decreased knowledge of use of DME PT Treatment Interventions: DME instruction;Gait training;Functional mobility training;Therapeutic activities;Therapeutic exercise;Patient/family education     PT Goals(Current goals can be found in the care plan section) Acute Rehab PT Goals Patient Stated Goal: to return to I PT Goal Formulation: With patient Time For Goal Achievement: 11/23/13 Potential to Achieve Goals: Good  Visit Information  Last PT Received On: 11/16/13 Assistance Needed: +2 History of Present Illness: s/p right TKA       Prior Functioning  Home Living Family/patient expects to be discharged to:: Skilled nursing facility Living Arrangements: Alone Additional Comments: has a 3:1 Prior Function Level of Independence: Independent with assistive device(s) Communication Communication: No difficulties    Cognition  Cognition Arousal/Alertness: Awake/alert Behavior During Therapy: WFL for tasks assessed/performed Overall Cognitive Status: Within Functional Limits for tasks assessed    Extremity/Trunk Assessment Upper Extremity Assessment Upper Extremity Assessment: Defer to OT evaluation RUE Deficits / Details: bil shoulders very limited by arthritis:  approx 30 degrees AROM then painful.  Also lacks bil elbow extension, approx -40.  Wrists and fingers wfls; R MCPs have enlarged bumps LUE Deficits / Details: see RUE. Also, missing thumb Lower Extremity Assessment Lower Extremity Assessment: RLE deficits/detail RLE Deficits / Details: requires assist for SLR, painful, ankles limited bil at baseline-- 2+/5pf   Balance    End of Session PT - End of Session Equipment Utilized During Treatment: Gait belt Activity Tolerance: Patient tolerated treatment well Patient left: in chair;with call bell/phone within reach;with family/visitor present Nurse Communication: Mobility status  GP      [facility] 11/16/2013, 2:18 PM

## 2013-11-16 NOTE — Progress Notes (Signed)
Utilization review completed.  

## 2013-11-16 NOTE — Progress Notes (Signed)
11/16/13 1600  PT Visit Information  Last PT Received On 11/16/13  Assistance Needed +2  History of Present Illness s/p right TKA  PT Time Calculation  PT Start Time 1516  PT Stop Time 1545  PT Time Calculation (min) 29 min  Subjective Data  Patient Stated Goal to return to I  Precautions  Precautions Knee  Restrictions  Other Position/Activity Restrictions WBAT  Cognition  Arousal/Alertness Awake/alert  Behavior During Therapy WFL for tasks assessed/performed  Overall Cognitive Status Within Functional Limits for tasks assessed  Bed Mobility  Bed Mobility Sit to Supine  Sit to Supine 1: +2 Total assist  Sit to Supine: Patient Percentage 50%  Transfers  Transfers Sit to Stand;Stand to Sit  Sit to Stand 1: +2 Total assist;From chair/3-in-1;With upper extremity assist  Sit to Stand: Patient Percentage 50%  Stand to Sit 1: +2 Total assist;To bed;To elevated surface;With upper extremity assist  Stand to Sit: Patient Percentage 60%  Details for Transfer Assistance +2 for  wt shift to stand and to control descent; verbal cues and assist for hand placement and RLE positon  Ambulation/Gait  Ambulation/Gait Assistance 1: +2 Total assist  Ambulation/Gait: Patient Percentage 70%  Ambulation Distance (Feet) 40 Feet  Assistive device Rolling walker  Ambulation/Gait Assistance Details verbal cues for sequence and posture  Gait Pattern Step-to pattern;Antalgic;Trunk flexed  Total Joint Exercises  Ankle Circles/Pumps AROM;Both;10 reps  Quad Sets AROM;Both;10 reps  Heel Slides AAROM;Right;10 reps  Straight Leg Raises AAROM;Right;10 reps  PT - End of Session  Equipment Utilized During Treatment Gait belt  Activity Tolerance Patient tolerated treatment well  Patient left in bed;with call bell/phone within reach  Nurse Communication Mobility status  PT - Assessment/Plan  PT Plan Current plan remains appropriate  PT Frequency 7X/week  Follow Up Recommendations SNF  PT equipment None  recommended by PT  PT Goal Progression  Progress towards PT goals Progressing toward goals  Acute Rehab PT Goals  Time For Goal Achievement 11/23/13  Potential to Achieve Goals Good  PT General Charges  $$ ACUTE PT VISIT 1 Procedure  PT Treatments  $Gait Training 8-22 mins  $Therapeutic Exercise 8-22 mins

## 2013-11-16 NOTE — Op Note (Signed)
NAMEDUBLIN, GRAYER               ACCOUNT NO.:  [id number]  MEDICAL RECORD NO.:  [id number]  LOCATION:  4696                         FACILITY:  [facility]  PHYSICIAN:  Pietro Cassis. Alvan Dame, M.D.  DATE OF BIRTH:  1946-02-03  DATE OF PROCEDURE:  11/15/2013 DATE OF DISCHARGE:                              OPERATIVE REPORT   PREOPERATIVE DIAGNOSES: 1. Right knee osteoarthritis. 2. Osteopenia. 3. Obesity.  POSTOPERATIVE DIAGNOSES: 1. Right knee osteoarthritis. 2. Osteopenia. 3. Obesity.  PROCEDURE:  Right total knee arthroplasty utilizing a DePuy rotating platform posterior stabilized knee system with size 6 femur, a size 5 MBT revision tray with a 29 cemented sleeve, 5 mm medial and lateral augments, a size 12.5 posterior stabilized insert, and 41 patellar button.  Please see the body.  Further indications were utilized in these types of components.  SURGEON:  Pietro Cassis. Alvan Dame, M.D.  ASSISTANT:  Danae Orleans, PA.  Note that, [name] was present for the entirety of the case from preoperative position and perioperative management of the upper extremity, general facilitation of the case, primary wound closure.  ANESTHESIA:  General.  DRAINS:  None.  BLOOD LOSS:  About less than 150 mL.  TOURNIQUET TIME:  59 minutes and 250 mmHg.  COMPLICATIONS:  None.  INDICATIONS FOR PROCEDURE:  [name] is a 67 year old male who had presented to the office for evaluation of bilateral knee pain. Currently, his right knee was more symptomatic than his left.  He had failed conservative measures of injections, medications, activity modification.  He was at this point ready to proceed with more definitive measures.  Risk of infection discussed based on his history, diabetes, as well as obesity, risk of early failure based on his size, standard risk of blood clot, DVT issues were reviewed.  Consent was obtained for benefit of pain relief.  PROCEDURE IN DETAIL:  The patient was brought to the  operative theater. Once adequate anesthesia preoperative antibiotics, Ancef 3 g administered, the patient was positioned supine with the right thigh tourniquet placed.  The right lower extremity was then prepped and draped in sterile fashion with the right leg placed in the [facility] leg holder.  Time-out was performed identifying the patient, planned procedure, and extremity.  Midline incision was made followed by median parapatellar arthrotomy. Soft tissue planes created identifying the significant hypertrophied synovium.  Following the exposure, attention was first directed to the patella.  Precut measure was noted to be 26 mm.  I resected down to about 16 mm, 41 patellar button to restore height.  The lug holes were drilled and a metal shim placed to protect the patella from retractors and saw blades.  Further exposure of the knee medially and laterally was carried out. The intramedullary canal of the femur was opened with a drill, irrigated to try to prevent the fat emboli and intramedullary rod was then passed in 5 degrees of valgus.  I resected 11 mm bone off the distal femur for the possibility of needing a size 6 femur.  Following the distal femoral cut, I did size the femur.  The femur sized to be a size 6.  The size 6 instrumentation was open.  At  this point, the tibia was subluxated anteriorly.  Following resection of meniscus and remaining cruciate ligaments, the extramedullary guide was utilized and I resected 10 mm bone off the proximal lateral tibia.  With the knee exposed, then I confirmed the cut was perpendicular in the coronal plane using alignment rod.  Once this was done, we had not returned our attention to the femur.  The size 6 rotation block was then pinned into position, utilizing the C- clamp placed on the proximal tibia cut, noted to be perpendicular in coronal plane.  This was then exchanged with a 4-in-1 cutting block. Anterior, posterior, and chamfer  cuts were then made without complication.  Final box cut was then made off the lateral aspect of distal femur.  Following preparation of the femur, attention was directed back to the tibia.  The tibia was subluxated anteriorly and I felt that the size 5 seemed to fit the best on this cut surface.  The size 5 tray was then pinned into position, drilled, and keel punched.  I admitted a decision using MBT revision tray on him based on his size alone, but subsequently made the decision to alter this to utilize this cemented sleeve in order to help rotation based on the osteoporotic nature of his bone.  A trial reduction was carried out with a size 6 femur, the 5 tibial tray in place.  Even with a 15 mm insert, I felt there was a little bit of plane in extension or flexion and I was going to need to go up to a 17.5.  To avoid some of the stress transferred to this, I have decided to use medial and lateral augments on the proximal tibia in addition to the 29 cemented sleeve.  At this point, the trial components removed.  Final preparation of the proximal tibia was carried out by broaching to 29 sleeve.  At this point, the knee was copiously irrigated with normal saline solution pulse lavage.  Final components were opened on the back table. The medial components were configured under direct supervision.  The synovial capsule junction of the knee was injected with 0.25% Marcaine with epinephrine, 20 mL of Exparel and Toradol.  The cement was mixed.  The final components were then cemented into place.  The knee was brought to extension with a 12.5 insert with this knee came to full extension, stable flexion.  Once the cement had fully cured and excess cement was removed throughout the knee, the final 12.5 insert was selected and placed into the knee.  The knee was irrigated with normal saline solution.  No Hemovac drain was used.  The tourniquet was let down after 59 minutes.  The  extensor mechanism was then reapproximated using #1 Vicryl and 0 V-Loc suture. The remainder of wound was closed with 2-0 Vicryl and 3-0 Monocryl.  The hip was then cleaned, dried, and dressed sterilely using Dermabond and Aquacel dressing.  The knee was wrapped in sterile bulky dressing.  He was then brought to recovery room in stable condition, tolerated the procedure well.  He will be weightbearing as tolerated, progress to physical therapy.  Plan is to discharge to SNF prior to going home.     Pietro Cassis Alvan Dame, M.D.     MDO/MEDQ  D:  11/15/2013  T:  11/16/2013  Job:  [id number]

## 2013-11-16 NOTE — Evaluation (Signed)
Occupational Therapy Evaluation Patient Details Name: [name] MRN: [id number] DOB: [birth year] Today's Date: 11/16/2013 Time: 1017-5102 OT Time Calculation (min): 19 min  OT Assessment / Plan / Recommendation History of present illness s/p right TKA   Clinical Impression   Pt was admitted for R TKA.  He will benefit from skilled OT to increase safety and independence with adls.  Goals in acute are for overall min A (except max A for LB ADLs)    OT Assessment  Patient needs continued OT Services    Follow Up Recommendations  SNF    Barriers to Discharge      Equipment Recommendations       Recommendations for Other Services    Frequency  Min 2X/week    Precautions / Restrictions Precautions Precautions: Knee Restrictions Other Position/Activity Restrictions: WBAT   Pertinent Vitals/Pain 6/10 right knee.  Repositioned with ice    ADL  Grooming: Wash/dry hands;Wash/dry face;Set up Where Assessed - Grooming: Supported sitting Upper Body Bathing: Set up Where Assessed - Upper Body Bathing: Supported sitting Lower Body Bathing: +2 Total assistance Lower Body Bathing: Patient Percentage: 30% Where Assessed - Lower Body Bathing: Supported sit to stand Upper Body Dressing: Minimal assistance Where Assessed - Upper Body Dressing: Supported sitting Lower Body Dressing: +2 Total assistance Lower Body Dressing: Patient Percentage: 10% Where Assessed - Lower Body Dressing: Supported sit to stand Equipment Used: Sock aid (wide) Transfers/Ambulation Related to ADLs: pt did not want to stand up:  he had been sitting for one hour.  A x 2 with PT for sit to stand.  Pt was able to scoot forward in chair with min A.   ADL Comments: Pt has been improvising for adls.  He has a long shoehorn and has been stretching socks with this.  Now only able to reach just below knee level.  Has reacher but has not used it for adls.  Tried wide sock aid, but he needs longer strings due to  shoulder ROM limitations.      OT Diagnosis: Generalized weakness  OT Problem List: Decreased strength;Decreased activity tolerance;Decreased knowledge of use of DME or AE;Pain;Decreased range of motion OT Treatment Interventions: Self-care/ADL training;DME and/or AE instruction;Patient/family education;Therapeutic activities   OT Goals(Current goals can be found in the care plan section) Acute Rehab OT Goals OT Goal Formulation: With patient Time For Goal Achievement: 11/23/13 Potential to Achieve Goals: Good ADL Goals Pt Will Perform Lower Body Bathing: with min assist Pt Will Perform Lower Body Dressing: with max assist;with adaptive equipment;sit to/from stand Pt Will Transfer to Toilet: ambulating;bedside commode;with min assist Pt Will Perform Toileting - Clothing Manipulation and hygiene: with min assist;sit to/from stand  Visit Information  Last OT Received On: 11/16/13 Assistance Needed: +2 History of Present Illness: s/p right TKA       Prior [place] expects to be discharged to:: Skilled nursing facility Living Arrangements: Alone Additional Comments: has a 3:1 Prior Function Level of Independence: Independent with assistive device(s) Communication Communication: No difficulties         Vision/Perception     Cognition  Cognition Arousal/Alertness: Awake/alert Behavior During Therapy: WFL for tasks assessed/performed Overall Cognitive Status: Within Functional Limits for tasks assessed    Extremity/Trunk Assessment Upper Extremity Assessment Upper Extremity Assessment: RUE deficits/detail;LUE deficits/detail RUE Deficits / Details: bil shoulders very limited by arthritis:  approx 30 degrees AROM then painful.  Also lacks bil elbow extension, approx -40.  Wrists and fingers  wfls; R MCPs have enlarged bumps LUE Deficits / Details: see RUE. Also, missing thumb     Mobility       Exercise     Balance     End of  Session OT - End of Session Activity Tolerance: Patient tolerated treatment well Patient left: in chair;with call bell/phone within reach  [place] 11/16/2013, 12:21 PM Lesle Chris, OTR/L [phone] 11/16/2013

## 2013-11-17 DIAGNOSIS — E669 Obesity, unspecified: Secondary | ICD-10-CM | POA: Diagnosis present

## 2013-11-17 DIAGNOSIS — D500 Iron deficiency anemia secondary to blood loss (chronic): Secondary | ICD-10-CM | POA: Diagnosis not present

## 2013-11-17 LAB — CBC
HCT: 23.1 % — ABNORMAL LOW (ref 39.0–52.0)
MCH: 30 pg (ref 26.0–34.0)
MCHC: 31.6 g/dL (ref 30.0–36.0)
MCV: 95.1 fL (ref 78.0–100.0)
Platelets: 176 10*3/uL (ref 150–400)
RDW: 17.9 % — ABNORMAL HIGH (ref 11.5–15.5)
WBC: 8.8 10*3/uL (ref 4.0–10.5)

## 2013-11-17 LAB — BASIC METABOLIC PANEL
BUN: 23 mg/dL (ref 6–23)
Calcium: 8.4 mg/dL (ref 8.4–10.5)
Chloride: 107 mEq/L (ref 96–112)
Creatinine, Ser: 1.33 mg/dL (ref 0.50–1.35)
GFR calc Af Amer: 62 mL/min — ABNORMAL LOW (ref >90)
GFR calc non Af Amer: 53 mL/min — ABNORMAL LOW (ref >90)
Glucose, Bld: 145 mg/dL — ABNORMAL HIGH (ref 70–99)
Potassium: 4.3 mEq/L (ref 3.5–5.1)

## 2013-11-17 MED ORDER — ACETAMINOPHEN 325 MG PO TABS
325.0000 mg | ORAL_TABLET | Freq: Four times a day (QID) | ORAL | Status: DC | PRN
Start: 2013-11-17 — End: 2019-04-02

## 2013-11-17 MED ORDER — DSS 100 MG PO CAPS: 100.0000 mg | ORAL_CAPSULE | Freq: Two times a day (BID) | ORAL | Status: DC

## 2013-11-17 MED ORDER — OXYCODONE HCL 5 MG PO TABS: 5.0000 mg | ORAL_TABLET | ORAL | Status: DC | PRN

## 2013-11-17 MED ORDER — POLYETHYLENE GLYCOL 3350 17 G PO PACK: 17.0000 g | PACK | Freq: Two times a day (BID) | ORAL | Status: DC

## 2013-11-17 MED ORDER — ASPIRIN EC 325 MG PO TBEC: 325.0000 mg | DELAYED_RELEASE_TABLET | Freq: Two times a day (BID) | ORAL | Status: DC

## 2013-11-17 MED ORDER — FERROUS SULFATE 325 (65 FE) MG PO TABS: 325.0000 mg | ORAL_TABLET | Freq: Three times a day (TID) | ORAL | Status: DC

## 2013-11-17 NOTE — Plan of Care (Cosign Needed)
Problem: Consults Goal: Diagnosis- Total Joint Replacement Revision Total Knee

## 2013-11-17 NOTE — Clinical Social Work Psychosocial (Signed)
     Clinical Social Work Department BRIEF PSYCHOSOCIAL ASSESSMENT 11/17/2013  Patient:  [name], [name]     Account Number:  [id number]     Admit date:  11/15/2013  Clinical Social Worker:  Venia Minks  Date/Time:  11/17/2013 12:00 M  Referred by:  Physician  Date Referred:  11/17/2013 Referred for  SNF Placement   Other Referral:   Interview type:  Patient Other interview type:    PSYCHOSOCIAL DATA Living Status:  ALONE Admitted from facility:   Level of care:   Primary support name:  Jackson Latino Primary support relationship to patient:  SIBLING Degree of support available:   good    CURRENT CONCERNS Current Concerns  Post-Acute Placement   Other Concerns:    SOCIAL WORK ASSESSMENT / PLAN CSW met with ptaient. patient is alert and oriented X3. patient in need of snf placement. patient is friendly and cooperative. he states that he would like to go to [facility] for rehab. if they don't have a bed available, he would like [facility] [employer] .   Assessment/plan status:   Other assessment/ plan:   Information/referral to community resources:    PATIENTS/FAMILYS RESPONSE TO PLAN OF CARE: patient is hopeful that short term rehab will get him well and home in a few weeks. he is optomistic.

## 2013-11-17 NOTE — Progress Notes (Signed)
Physical Therapy Treatment Patient Details Name: ABBIE JABLON MRN: [id number] DOB: [birth year] Today's Date: 11/17/2013 Time: 1330-1409 PT Time Calculation (min): 39 min  PT Assessment / Plan / Recommendation  History of Present Illness s/p right TKA   PT Comments   POD # 2 pm session.  Assisted out of recliner to amb in hallway, assisted back to bed then performed some TKR TE's.  Unable to tolerate all due to increased c/o pain.   Follow Up Recommendations  SNF     Does the patient have the potential to tolerate intense rehabilitation     Barriers to Discharge        Equipment Recommendations  None recommended by PT    Recommendations for Other Services    Frequency 7X/week   Progress towards PT Goals Progress towards PT goals: Progressing toward goals  Plan      Precautions / Restrictions Precautions Precautions: Knee Restrictions Weight Bearing Restrictions: No Other Position/Activity Restrictions: WBAT    Pertinent Vitals/Pain C/o 10/10 meds requested ICE applied    Mobility  Bed Mobility Bed Mobility: Sit to Supine Supine to Sit: 1: +2 Total assist Supine to Sit: Patient Percentage: 50% Sit to Supine: 1: +2 Total assist Sit to Supine: Patient Percentage: 40% Details for Bed Mobility Assistance: assisted back to bed with increased time and + 2  assist Transfers Transfers: Sit to Stand;Stand to Sit Sit to Stand: 1: +2 Total assist;With upper extremity assist;From chair/3-in-1 Sit to Stand: Patient Percentage: 50% Stand to Sit: 1: +2 Total assist;To elevated surface;With upper extremity assist;To bed Stand to Sit: Patient Percentage: 50% Details for Transfer Assistance: +2 for  wt shift to stand and to control descent; verbal cues and assist for hand placement and RLE positon Ambulation/Gait Ambulation/Gait Assistance: 1: +2 Total assist Ambulation/Gait: Patient Percentage: 80% Ambulation Distance (Feet): 38 Feet Assistive device: Rolling  walker Ambulation/Gait Assistance Details: increased time and 25% VC's to increase step length Gait Pattern: Step-to pattern;Antalgic;Trunk flexed Gait velocity: decreased    Exercises   Total Knee Replacement TE's 10 reps B LE ankle pumps 10 reps knee presses 10 reps heel slides  10 reps SAQ's Followed by ICE    PT Goals (current goals can now be found in the care plan section)    Visit Information  Last PT Received On: 11/17/13 Assistance Needed: +2 History of Present Illness: s/p right TKA    Subjective Data      Cognition       Balance     End of Session PT - End of Session Equipment Utilized During Treatment: Gait belt Activity Tolerance: Patient tolerated treatment well Patient left: in bed;with call bell/phone within reach Nurse Communication: Mobility status   Rica Koyanagi  PTA [facility]  Acute  Rehab Pager      [phone]

## 2013-11-17 NOTE — Progress Notes (Signed)
Physical Therapy Treatment Patient Details Name: [name] MRN: [id number] DOB: 21-Dec-1946 Today's Date: 11/17/2013 Time: 6295-2841 PT Time Calculation (min): 25 min  PT Assessment / Plan / Recommendation  History of Present Illness s/p right TKA   PT Comments   POD # 2 am session.  Assisted pt OOB to amb limited distance then positioned in recliner.   Follow Up Recommendations  SNF     Does the patient have the potential to tolerate intense rehabilitation     Barriers to Discharge        Equipment Recommendations  None recommended by PT    Recommendations for Other Services    Frequency 7X/week   Progress towards PT Goals Progress towards PT goals: Progressing toward goals  Plan      Precautions / Restrictions Precautions Precautions: Knee Restrictions Other Position/Activity Restrictions: WBAT    Pertinent Vitals/Pain C/o 8/10 pre medicated ICE applied    Mobility  Bed Mobility Bed Mobility: Supine to Sit Supine to Sit: 1: +2 Total assist Supine to Sit: Patient Percentage: 50% Details for Bed Mobility Assistance: multi-modal cues for technique; +2 for trunk and LEs Transfers Transfers: Sit to Stand;Stand to Sit Sit to Stand: 1: +2 Total assist;With upper extremity assist;From bed Sit to Stand: Patient Percentage: 50% Stand to Sit: 1: +2 Total assist;To elevated surface;With upper extremity assist;To chair/3-in-1 Stand to Sit: Patient Percentage: 50% Details for Transfer Assistance: +2 for  wt shift to stand and to control descent; verbal cues and assist for hand placement and RLE positon Ambulation/Gait Ambulation/Gait Assistance: 1: +2 Total assist Ambulation/Gait: Patient Percentage: 80% Ambulation Distance (Feet): 42 Feet Assistive device: Rolling walker Ambulation/Gait Assistance Details: increased time and 25% VC's on proper upright posture Gait Pattern: Step-to pattern;Antalgic;Trunk flexed Gait velocity: decreased    PT Goals (current goals  can now be found in the care plan section)    Visit Information  Last PT Received On: 11/17/13 Assistance Needed: +2 History of Present Illness: s/p right TKA    Subjective Data      Cognition       Balance     End of Session PT - End of Session Equipment Utilized During Treatment: Gait belt Activity Tolerance: Patient tolerated treatment well Patient left: in chair;with call bell/phone within reach Nurse Communication: Mobility status   Rica Koyanagi  PTA [facility]  Acute  Rehab Pager      [phone]

## 2013-11-17 NOTE — Progress Notes (Signed)
   Subjective: 2 Days Post-Op Procedure(s) (LRB): RIGHT TOTAL KNEE ARTHROPLASTY (Right)   Patient reports pain as mild, pain controlled. No events throughout the night.   Objective:   VITALS:   Filed Vitals:   11/17/13 0547  BP: 126/70  Pulse: 66  Temp: 99.1 F (37.3 C)  Resp: 18    Neurovascular intact Dorsiflexion/Plantar flexion intact Incision: dressing C/D/I No cellulitis present Compartment soft  LABS  Recent Labs  11/16/13 0425 11/17/13 0430  HGB 7.0* 7.3*  HCT 22.1* 23.1*  WBC 7.5 8.8  PLT 194 176     Recent Labs  11/16/13 0425 11/17/13 0430  NA 140 139  K 4.3 4.3  BUN 26* 23  CREATININE 1.31 1.33  GLUCOSE 149* 145*     Assessment/Plan: 2 Days Post-Op Procedure(s) (LRB): RIGHT TOTAL KNEE ARTHROPLASTY (Right) Foley cath d/c'ed Up with therapy Discharge to SNF eventually, when ready  Expected ABLA  Treated with iron and will observe  Obese (BMI 30-39.9) Estimated body mass index is 35 kg/(m^2) as calculated from the following:   Height as of this encounter: 6' 3"  (1.905 m).   Weight as of this encounter: 127.007 kg (280 lb). Patient also counseled that weight may inhibit the healing process Patient counseled that losing weight will help with future health issues        [name]. [name]   PAC  11/17/2013, 9:13 AM

## 2013-11-17 NOTE — Discharge Summary (Addendum)
Physician Discharge Summary  Patient ID: [name] MRN: [id number] DOB/AGE: Nov 02, 1946 67 y.o.  Admit date: 11/15/2013 Anticipated Discharge date:   11/18/2013  Procedures:  Procedure(s) (LRB): RIGHT TOTAL KNEE ARTHROPLASTY (Right)  Attending Physician:  Dr. Paralee Cancel   Admission Diagnoses:   Right knee OA / pain  Discharge Diagnoses:  Principal Problem:   S/P right TKA Active Problems:   Expected blood loss anemia   Obese  Past Medical History  Diagnosis Date  . Diastolic heart failure     Pedal edema, "a long time ago"  . Hypertension     Exercise induced  . NSVT (nonsustained ventricular tachycardia)     Exercise induced  . 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 Obesity   . Degenerative joint disease   . Benign prostatic hypertrophy     Nocturia  . Erectile dysfunction   . High cholesterol   . CAD (coronary artery disease)     a.  NSTEMI 10/2012 s/p DES to LAD & DES to 1st diagonal with residual diffuse nonobstructive dz in LCx/RCA   . Chronic kidney disease, stage 3, mod decreased GFR     Creatinine of 1.5 in 03/2009  . Diabetes mellitus, type II 12/10/2012.  Marland Kitchen Anxiety   . Depression     hx of  . Flu 2013    hx of    HPI: [name], 67 y.o. male, has a history of pain and functional disability in the right knee due to arthritis and has failed non-surgical conservative treatments for greater than 12 weeks to includeNSAID's and/or analgesics, corticosteriod injections, use of assistive devices, weight reduction as appropriate and activity modification. Onset of symptoms was gradual, starting 1+ years ago with rapidlly worsening course for the last 4-5 months. The patient noted no past surgery on the right knee(s). Patient currently rates pain in the right knee(s) at 9 out of 10 with activity. Patient has night pain, worsening of pain with activity and weight bearing, pain that interferes with activities of daily living, pain  with passive range of motion, crepitus and joint swelling. Patient has evidence of periarticular osteophytes and joint space narrowing by imaging studies. There is no active infection. Risks, benefits and expectations were discussed with the patient. Risks including but not limited to the risk of anesthesia, blood clots, nerve damage, blood vessel damage, failure of the prosthesis, infection and up to and including death. Patient understand the risks, benefits and expectations and wishes to proceed with surgery.   PCP: [name], MD   Discharged Condition: good  Hospital Course:  Patient underwent the above stated procedure on 11/15/2013. Patient tolerated the procedure well and brought to the recovery room in good condition and subsequently to the floor.  POD #1 BP: 133/78 ; Pulse: 56 ; Temp: 98.3 F (36.8 C) ; Resp: 16 Pt's foley was removed. IV was changed to a saline lock. Patient reports pain as moderate. But doing OK, no events, expecting SNF [facility]) at discharge. Neurovascular intact, dorsiflexion/plantar flexion intact, incision: dressing C/D/I, no cellulitis present and compartment soft.   LABS  Basename    HGB  7.0  HCT  22.1   POD #2  BP: 126/70 ; Pulse: 66 ; Temp: 99.1 F (37.3 C) ; Resp: 18 Patient reports pain as mild, pain controlled. No events throughout the night.  Neurovascular intact, dorsiflexion/plantar flexion intact, incision: dressing C/D/I, no cellulitis present and compartment soft.  LABS  Basename    HGB  7.3  HCT  23.1   POD #3  BP: 162/91 ; Pulse: 81 ; Temp: 99.1 F (37.3 C) ; Resp: 18  Patient reports pain as mild, pain controlled. No events throughout the night. Ready to be discharged to SNF. Neurovascular intact, dorsiflexion/plantar flexion intact, incision: dressing C/D/I, no cellulitis present and compartment soft.   LABS   No new labs   Discharge Exam: General appearance: alert, cooperative and no distress Extremities: Homans  sign is negative, no sign of DVT, no edema, redness or tenderness in the calves or thighs and no ulcers, gangrene or trophic changes  Disposition:     Home or Self Care with follow up in 2 weeks   Follow-up Information   Follow up with Mauri Pole, MD. Schedule an appointment as soon as possible for a visit in 2 weeks.   Specialty:  Orthopedic Surgery   Contact information:   [address] [place] [address] [phone]       Discharge Orders   Future Appointments Provider Department Dept Phone   03/09/2014 11:30 AM Omer Jack [place] [phone]   Future Orders Complete By Expires   Call MD / Call 911  As directed    Comments:     If you experience chest pain or shortness of breath, CALL 911 and be transported to the hospital emergency room.  If you develope a fever above 101 F, pus (white drainage) or increased drainage or redness at the wound, or calf pain, call your surgeon's office.   Change dressing  As directed    Comments:     Maintain surgical dressing for 10-14 days, then change the dressing daily with sterile 4 x 4 inch gauze dressing and tape. Keep the area dry and clean.   Constipation Prevention  As directed    Comments:     Drink plenty of fluids.  Prune juice may be helpful.  You may use a stool softener, such as Colace (over the counter) 100 mg twice a day.  Use MiraLax (over the counter) for constipation as needed.   Diet - low sodium heart healthy  As directed    Discharge instructions  As directed    Comments:     Maintain surgical dressing for 10-14 days, then replace with gauze and tape. Keep the area dry and clean until follow up. Follow up in 2 weeks at [facility]. Call with any questions or concerns.   Driving restrictions  As directed    Comments:     No driving for 4 weeks   Increase activity slowly as tolerated  As directed    TED hose  As directed    Comments:     Use stockings (TED  hose) for 2 weeks on both leg(s).  You may remove them at night for sleeping.   Weight bearing as tolerated  As directed    Questions:     Laterality:     Extremity:          Medication List    STOP taking these medications       aspirin 81 MG chewable tablet  Replaced by:  aspirin EC 325 MG tablet     HYDROcodone-acetaminophen 5-325 MG per tablet  Commonly known as:  NORCO/VICODIN      TAKE these medications       acetaminophen 325 MG tablet  Commonly known as:  TYLENOL  Take 1-2  tablets (325-650 mg total) by mouth every 6 (six) hours as needed.     allopurinol 300 MG tablet  Commonly known as:  ZYLOPRIM  Take 300 mg by mouth daily.     ALPRAZolam 0.5 MG tablet  Commonly known as:  XANAX  Take 0.5 mg by mouth 2 (two) times daily as needed for anxiety.     amLODipine 10 MG tablet  Commonly known as:  NORVASC  Take 10 mg by mouth daily.     aspirin EC 325 MG tablet  Take 1 tablet (325 mg total) by mouth 2 (two) times daily. Take for 4 weeks.     DSS 100 MG Caps  Take 100 mg by mouth 2 (two) times daily.     ferrous sulfate 325 (65 FE) MG tablet  Take 1 tablet (325 mg total) by mouth 3 (three) times daily after meals.     furosemide 40 MG tablet  Commonly known as:  LASIX  Take 40 mg by mouth daily.     gabapentin 300 MG capsule  Commonly known as:  NEURONTIN  Take 300 mg by mouth 2 (two) times daily.     metoprolol 50 MG tablet  Commonly known as:  LOPRESSOR  Take 50 mg by mouth 2 (two) times daily.     NITROSTAT 0.4 MG SL tablet  Generic drug:  nitroGLYCERIN  Place 0.4 mg under the tongue every 5 (five) minutes as needed for chest pain.     ONGLYZA 5 MG Tabs tablet  Generic drug:  saxagliptin HCl  Take 5 mg by mouth daily.     oxyCODONE 5 MG immediate release tablet  Commonly known as:  Oxy IR/ROXICODONE  Take 1-3 tablets (5-15 mg total) by mouth every 4 (four) hours as needed for severe pain.     polyethylene glycol packet  Commonly known as:   MIRALAX / GLYCOLAX  Take 17 g by mouth 2 (two) times daily.     pravastatin 20 MG tablet  Commonly known as:  PRAVACHOL  Take 20 mg by mouth at bedtime.         Signed: [place] Pugh. Yuri Flener   PAC  11/17/2013, 12:32 PM

## 2013-11-17 NOTE — Progress Notes (Signed)
Patient has bed at [facility] and will be discharged tomorrow. CSW met with patient. Patient is agree[facility]. Packet copied and given to charge nurse. RN to call transport in the AM.  Halley Shepheard C. Marble MSW, [place]

## 2013-11-17 NOTE — Care Management Note (Signed)
    Page 1 of 1   11/17/2013     12:26:22 PM   CARE MANAGEMENT NOTE 11/17/2013  Patient:  [name], [name]   Account Number:  [id number]  Date Initiated:  11/17/2013  Documentation initiated by:  Sherrin Daisy  Subjective/Objective Assessment:   dx OA rt knee, total knee replacemnt     Action/Plan:   Plans are for SNF rehab   Anticipated DC Date:  11/18/2013   Anticipated DC Plan:  SKILLED NURSING FACILITY  In-house referral  Clinical Social Worker      DC Planning Services  CM consult      Choice offered to / List presented to:             Status of service:  Completed, signed off Medicare Important Message given?   (If response is "NO", the following Medicare IM given date fields will be blank) Date Medicare IM given:   Date Additional Medicare IM given:    Discharge Disposition:    Per UR Regulation:    If discussed at Long Length of Stay Meetings, dates discussed:    Comments:

## 2013-11-17 NOTE — Clinical Social Work Placement (Signed)
     Clinical Social Work Department CLINICAL SOCIAL WORK PLACEMENT NOTE 11/17/2013  Patient:  [name], [name]  Account Number:  [id number] Admit date:  11/15/2013  Clinical Social Worker:  Caren Hazy, LCSW  Date/time:  11/17/2013 12:00 M  Clinical Social Work is seeking post-discharge placement for this patient at the following level of care:   [place]   (*CSW will update this form in Epic as items are completed)   11/17/2013  Patient/family provided with [place] [place] Department of Clinical Social Works list of facilities offering this level of care within the geographic area requested by the patient (or if unable, by the patients family).  11/17/2013  Patient/family informed of their freedom to choose among providers that offer the needed level of care, that participate in Medicare, Medicaid or managed care program needed by the patient, have an available bed and are willing to accept the patient.  11/17/2013  Patient/family informed of [facility] ownership interest in [facility], as well as of the fact that they are under no obligation to receive care at this facility.  PASARR submitted to EDS on 11/17/2013 PASARR number received from EDS on 11/17/2013  FL2 transmitted to all facilities in geographic area requested by pt/family on  11/17/2013 FL2 transmitted to all facilities within larger geographic area on   Patient informed that his/her managed care company has contracts with or will negotiate with  certain facilities, including the following:     Patient/family informed of bed offers received:  11/18/2013 Patient chooses bed at [facility] Physician recommends and patient chooses bed at    Patient to be transferred to [facility] on  11/18/2013 Patient to be transferred to facility by ptar  The following physician request were entered in Epic:   Additional Comments:

## 2013-11-18 ENCOUNTER — Non-Acute Institutional Stay (SKILLED_NURSING_FACILITY): Payer: Medicare Other | Admitting: Internal Medicine

## 2013-11-18 ENCOUNTER — Inpatient Hospital Stay
Admission: RE | Admit: 2013-11-18 | Discharge: 2013-11-19 | Disposition: A | Discharge status: Other Acute Inpatient Hospital | Payer: Medicare Other | Source: Ambulatory Visit | Attending: Internal Medicine | Admitting: Internal Medicine

## 2013-11-18 DIAGNOSIS — Z96651 Presence of right artificial knee joint: Secondary | ICD-10-CM

## 2013-11-18 DIAGNOSIS — Z96659 Presence of unspecified artificial knee joint: Secondary | ICD-10-CM

## 2013-11-18 DIAGNOSIS — R509 Fever, unspecified: Secondary | ICD-10-CM

## 2013-11-18 DIAGNOSIS — L0390 Cellulitis, unspecified: Secondary | ICD-10-CM

## 2013-11-18 DIAGNOSIS — I10 Essential (primary) hypertension: Secondary | ICD-10-CM

## 2013-11-18 DIAGNOSIS — D649 Anemia, unspecified: Secondary | ICD-10-CM

## 2013-11-18 DIAGNOSIS — L0291 Cutaneous abscess, unspecified: Secondary | ICD-10-CM

## 2013-11-18 DIAGNOSIS — E119 Type 2 diabetes mellitus without complications: Secondary | ICD-10-CM

## 2013-11-18 DIAGNOSIS — I25810 Atherosclerosis of coronary artery bypass graft(s) without angina pectoris: Secondary | ICD-10-CM

## 2013-11-18 DIAGNOSIS — N183 Chronic kidney disease, stage 3 unspecified: Secondary | ICD-10-CM

## 2013-11-18 NOTE — Progress Notes (Signed)
Pt transported via [facility] to [facility].

## 2013-11-18 NOTE — Progress Notes (Signed)
Report called to Domenica Fail, RN at [facility].

## 2013-11-18 NOTE — Progress Notes (Signed)
   Subjective: 3 Days Post-Op Procedure(s) (LRB): RIGHT TOTAL KNEE ARTHROPLASTY (Right)   Patient reports pain as mild, pain controlled. No events throughout the night. Ready to be discharged to SNF.  Objective:   VITALS:   Filed Vitals:   11/18/13 0508  BP: 162/91  Pulse: 81  Temp: 99.1 F (37.3 C)  Resp: 18    Neurovascular intact Dorsiflexion/Plantar flexion intact Incision: dressing C/D/I No cellulitis present Compartment soft  LABS  Recent Labs  11/16/13 0425 11/17/13 0430  HGB 7.0* 7.3*  HCT 22.1* 23.1*  WBC 7.5 8.8  PLT 194 176     Recent Labs  11/16/13 0425 11/17/13 0430  NA 140 139  K 4.3 4.3  BUN 26* 23  CREATININE 1.31 1.33  GLUCOSE 149* 145*     Assessment/Plan: 3 Days Post-Op Procedure(s) (LRB): RIGHT TOTAL KNEE ARTHROPLASTY (Right) Up with therapy Discharge to SNF Follow up in 2 weeks at [facility]. Follow up with OLIN,Tonjua Rossetti D in 2 weeks.  Contact information:  [facility] [name] [place] [phone]    Expected ABLA  Treated with iron and will observe   Obese (BMI 30-39.9)  Estimated body mass index is 35 kg/(m^2) as calculated from the following:      Height as of this encounter: 6' 3"  (1.905 m).      Weight as of this encounter: 127.007 kg (280 lb).  Patient also counseled that weight may inhibit the healing process  Patient counseled that losing weight will help with future health issues       [name]   PAC  11/18/2013, 8:17 AM

## 2013-11-18 NOTE — Progress Notes (Signed)
Physical Therapy Treatment Patient Details Name: [name] MRN: [id number] DOB: 06/30/1946 Today's Date: 11/18/2013 Time: 0071-2197 PT Time Calculation (min): 25 min  PT Assessment / Plan / Recommendation  History of Present Illness s/p right TKA   PT Comments   POD # 3 am session.  Assisted pt OOB with increased time and + 2 assist to amb in hallway.  Pt progressing slowly and plans to D/C to [facility] for [place].   Follow Up Recommendations  SNF [facility])     Does the patient have the potential to tolerate intense rehabilitation     Barriers to Discharge        Equipment Recommendations  None recommended by PT    Recommendations for Other Services    Frequency 7X/week   Progress towards PT Goals Progress towards PT goals: Progressing toward goals  Plan      Precautions / Restrictions Precautions Precautions: Knee;Fall Restrictions Weight Bearing Restrictions: No Other Position/Activity Restrictions: WBAT    Pertinent Vitals/Pain C/o 8/10 pain after session meds requested and ICE applied    Mobility  Bed Mobility Bed Mobility: Supine to Sit Supine to Sit: 1: +2 Total assist Supine to Sit: Patient Percentage: 60% Details for Bed Mobility Assistance: assisted with supporting R LE off bed and increased time to swival hips around using bed pad as well. Transfers Transfers: Sit to Stand;Stand to Sit Sit to Stand: 1: +2 Total assist;With upper extremity assist;From bed;From elevated surface Sit to Stand: Patient Percentage: 60% Stand to Sit: 1: +2 Total assist;To elevated surface;With upper extremity assist;To chair/3-in-1 Details for Transfer Assistance: +2 for  wt shift to stand and to control descent; verbal cues and assist for hand placement and RLE positon Ambulation/Gait Ambulation/Gait Assistance: 3: Mod assist;4: Min assist Ambulation Distance (Feet): 45 Feet Assistive device: Rolling walker Ambulation/Gait Assistance Details: increased time  with 50% VC's to increase step length and upright posture Gait Pattern: Step-to pattern;Antalgic;Trunk flexed Gait velocity: decreased     PT Goals (current goals can now be found in the care plan section)    Visit Information  Last PT Received On: 11/18/13 Assistance Needed: +2 History of Present Illness: s/p right TKA    Subjective Data      Cognition       Balance     End of Session PT - End of Session Equipment Utilized During Treatment: Gait belt Activity Tolerance: Patient limited by pain Patient left: in chair;with call bell/phone within reach   Rica Koyanagi  PTA [facility]  Acute  Rehab Pager      [phone]

## 2013-11-18 NOTE — Progress Notes (Signed)
Following patient's foley removal patient was able to void but only small amounts.  Bladder scan performed, I&O cath performed, will continue to monitor.

## 2013-11-18 NOTE — Progress Notes (Signed)
Patient ID: [name], male   DOB: 03-16-1946, 67 y.o.   MRN: [id number]  This is an acute visit.  Level of care skilled.  Facility [facility].  Chief complaint-acute visit status post hospitalization for right knee replacement-with history of anemia-renal insufficiency.  History of present illness.  Patient is a 67 year old male who underwent a right total knee replacement earlier this week secondary to end-stage osteoarthritis which failed conservative treatment.  He tolerated the procedure well -.  He does have a history of anemia thought to be chronic disease per review ofEPIC note by hematology.  His hemoglobin on discharge was 7.3 it appeared was 7.0 on November 25 but did rise to several 7.3 the next day it appears his preop hemoglobin was just over 10.  He also has renal insufficiency with a creatinine appears to be in the mid ones it was 1.33 on discharge.  Nursing staff did note he had a low-grade temperature on admission this afternoon at 101.3 tract rectally--he does not complaining of any shortness of breath cough -although he says he feels a bit weaker ever since he had the ambulance ride this afternoon.  His blood sugar appears to be in the mid 100s-apparently initially was over 300 but it was rechecked with another glucometer and was more within normal range.  His right knee area does have some edema and warmth no active drainage--  Previous medical history.  Status post right TKA.  Anemia thought to be blood loss complicated with history of chronic anemia.  Obesity.  Diastolic CHF.  Hypertension.  Non-SVT tachycardia.  Tobacco abuse 30 pack years he quit in 1994.  Degenerative joint disease.  BPH.  Erectile dysfunction.  Hyperlipidemia.  Coronary artery disease-non-ST MI November 2013.  Chronic kidney disease stage III baseline creatinine appears to be in the mid ones.  Diabetes type 2.  Anxiety.  Depression.  Surgical history.  Previous  history of hernia repair.  Recent total right knee replacement as noted above.  Previous history of cardiac catheterization.  Social history.  Patient is a widower he does have one child he lives in a house he does have steps-previous history of tobacco use 30 pack year he quit in 1994-- No history of ulcers tobacco illicit drug or alcohol use.  Hospital studies.  10/26/2013.  Cardiac echo showed a left ventricular ejection fraction 50-55% with grade 1 diastolic dysfunction.  Medications.  Aspirin 325 mg twice a day for 4 weeks.  Tylenol 325-650 mg every 6 hours when necessary.  Allopurinol 300 mg daily.  Xanax 0.5 mg twice a day when necessary.  Norvasc 10 mg daily.  Ferrous sulfate 325 mg 3 times a day after meals.  Lasix 40 mg daily.  Neurontin 300 mg twice a day.  Lopressor 50 mg twice a day.  Nitroglycerin when necessary.  Onglyza 5 mg daily.  I CIR 5 mg every 4 hours as needed may take one-up to 3 tabs.  MiraLax twice a day.  Pravachol 20 mg each bedtime.  Review of systems.  In general low-grade temperature has been noted as above- says he feels a bit weaker since he had the ambulance ride states he's had a long day  Skin-does not complaining any rash or itching.  Head ears eyes nose mouth and throat-does not complaining of dysphagia sore throat nasal discharge.  Respiratory-no complaints of shortness of breath or cough.  Cardiac-history of MI coronary artery disease but does not complaining of chest pain does have a history of  diastolic CHF he is on Lasix with some chronic edema apparently.  GI-does complain of constipation says he has not had a bowel movement since before surgery she has he feels a little nauseous at times thinks this is due to constipation does not complaining of overt abdominal pain or discomfort.  GU-complaining of dysuria but apparently he did have some urinary flow issues in hospital he does have a history of BPH.   muscle  skeletal-at this point is not overtly complain of pain in the right knee apparently current medications are fairly effective-.  Neurologic-does not complaining or numbness at this time.  Psych-history of anxiety and depression at this point appears fairly stable although he does appear somewhat tired.  Physical exam.  Temperatue  101.3 rectal-pulse 83 respirations 17 blood pressure 146/72 O2 saturation 97% on room air.   in general this is a somewhat obese elderly male in no distress lying comfortably in bed.  His skin is warm and dry right knee area does have some warmth and postop edema-this is not acutely tender-- surgical site appears well approximated with no drainage or bleeding--although appears to have some increased warmth around the knee area  Eyes pupils. We'll round reactive to light visual acuity appears grossly intact.  Oropharynx clear mucous membranes moist.  Chest is clear to auscultation with no labored breathing.  Heart is regular rate and rhythm somewhat distant heart sounds I could not appreciate a murmur gallop or rub.  He does have one-2+ edema of his right leg pedal pulse is intact there is some tenderness here he says this is baseline with what it was in the hospital.  Has trace lower extremity edema to his left leg.  GU-I. could not appreciate any suprapubic distention disappeared be relatively unremarkable.  Abdomen soft obese soft nontender with active bowel sounds.  Neurologic-appears grossly intact I could not appreciate any focal weakness his speech is clear.  Muscle skeletal-surgical site as noted above-- otherwise appears to move extremities at baseline -- did not see any deformities  Psych-he is alert and oriented x3 pleasant and appropriate.  Labs.  11/17/2013.  CBC 8.8 hemoglobin 7.3 platelets 176.  4139 potassium 4.3 BUN 23 creatinine 1.33.  11/08/2013.  Urinalysis appeared to be negative.  Assessment plan.  #1-history right total  knee replacement-will need orthopedic followup some concern here that are possibly may be a small cellulitis starting here around the surgical site--especially with his  elevated temperature-I did discuss this with Dr. Mariea Clonts via phone  -Will start Keflex 500 mg 3 times a day for 7 days -- also will obtain a CBC with differential and basic metabolic panel stat as well as blood cultures x2-also monitor closely with vital signs pulse ox every 4 hours for now Also with previous history of urinary issues will order a urinalysis and culture .  He continues on OxyIR for pain relief at this point appears ieffectived Will have to monitor this-he is on aspirin twice a day for DVT prophylaxis.  #2-history of anemia thought to be a combination of acute blood loss and chronic anemia-he is on iron 3 times a day-hemoglobin appeared to be trending up on discharge will recheck this with a CBC.  Clinically he does not appear to be unstable in this regards.  #3-history of renal insufficiency-this is complicated with a history of diastolic CHF he is on chronic Lasix-nonetheless appears to be stable at this point creatinine was trending down in the hospital-we'll update this tonight as well as  first lab day next week--also monitor weights 2 times a week notify provider gain greater than 5 pounds.  #4-hypertension-he continues on Norvasc as well as metoprolol--systolic appears to be somewhat elevated today however we have minimal readings we'll monitor this for now.  #5-history diabetes type 2-continues on Onglyza---we're monitoring CBGs a.c. and at bedtime but sugar appeared to be in the mid 100s today.  #6-constipation --he is on MiraLax 2 times a day-ordered Dulcolax suppository daily when necessary as well-continue to monitor if no BM by tomorrow AM notify provider  #7-CAD-he is on aspirin as well as a statin as well as a beta blocker--he does have nitroglycerin when necessary-at this point appears to be stable.  #8  past history hyperlipidemia-he is on Pravachol-since his stay here I suspect will be short Will defer aggressive treatment of this to his primary care provider-.  #9-history of neuropathy-  at-this point appears stable on Neurontin  #10-history of gout-he continues on allopurinol-at this point appears asymptomatic.  Marland Kitchen  #11 history of anxiety that she is on when necessary Xanax will monitor.  [id number] note greater than 50 minutes spent assessing patient-and coordinating and formulating a plan of care for numerous diagnoses       -

## 2013-11-19 ENCOUNTER — Encounter (HOSPITAL_COMMUNITY): Payer: Self-pay | Admitting: Emergency Medicine

## 2013-11-19 ENCOUNTER — Emergency Department (HOSPITAL_COMMUNITY): Payer: Medicare Other

## 2013-11-19 ENCOUNTER — Inpatient Hospital Stay (HOSPITAL_COMMUNITY)
Admission: EM | Admit: 2013-11-19 | Discharge: 2013-11-23 | DRG: 603 | Disposition: A | Discharge status: Skilled Nursing Facility | Payer: Medicare Other | Attending: Internal Medicine | Admitting: Internal Medicine

## 2013-11-19 ENCOUNTER — Observation Stay (HOSPITAL_COMMUNITY): Payer: Medicare Other

## 2013-11-19 DIAGNOSIS — L02419 Cutaneous abscess of limb, unspecified: Principal | ICD-10-CM | POA: Diagnosis present

## 2013-11-19 DIAGNOSIS — E7800 Pure hypercholesterolemia, unspecified: Secondary | ICD-10-CM | POA: Diagnosis present

## 2013-11-19 DIAGNOSIS — K5900 Constipation, unspecified: Secondary | ICD-10-CM | POA: Diagnosis present

## 2013-11-19 DIAGNOSIS — N183 Chronic kidney disease, stage 3 unspecified: Secondary | ICD-10-CM | POA: Diagnosis present

## 2013-11-19 DIAGNOSIS — N289 Disorder of kidney and ureter, unspecified: Secondary | ICD-10-CM

## 2013-11-19 DIAGNOSIS — F3289 Other specified depressive episodes: Secondary | ICD-10-CM | POA: Diagnosis present

## 2013-11-19 DIAGNOSIS — Z96651 Presence of right artificial knee joint: Secondary | ICD-10-CM

## 2013-11-19 DIAGNOSIS — K648 Other hemorrhoids: Secondary | ICD-10-CM | POA: Diagnosis present

## 2013-11-19 DIAGNOSIS — E119 Type 2 diabetes mellitus without complications: Secondary | ICD-10-CM

## 2013-11-19 DIAGNOSIS — Z87891 Personal history of nicotine dependence: Secondary | ICD-10-CM

## 2013-11-19 DIAGNOSIS — D649 Anemia, unspecified: Secondary | ICD-10-CM

## 2013-11-19 DIAGNOSIS — I130 Hypertensive heart and chronic kidney disease with heart failure and stage 1 through stage 4 chronic kidney disease, or unspecified chronic kidney disease: Secondary | ICD-10-CM | POA: Diagnosis present

## 2013-11-19 DIAGNOSIS — I10 Essential (primary) hypertension: Secondary | ICD-10-CM

## 2013-11-19 DIAGNOSIS — D500 Iron deficiency anemia secondary to blood loss (chronic): Secondary | ICD-10-CM

## 2013-11-19 DIAGNOSIS — M1990 Unspecified osteoarthritis, unspecified site: Secondary | ICD-10-CM | POA: Diagnosis present

## 2013-11-19 DIAGNOSIS — L0390 Cellulitis, unspecified: Secondary | ICD-10-CM

## 2013-11-19 DIAGNOSIS — N400 Enlarged prostate without lower urinary tract symptoms: Secondary | ICD-10-CM | POA: Diagnosis present

## 2013-11-19 DIAGNOSIS — I509 Heart failure, unspecified: Secondary | ICD-10-CM | POA: Diagnosis present

## 2013-11-19 DIAGNOSIS — F329 Major depressive disorder, single episode, unspecified: Secondary | ICD-10-CM | POA: Diagnosis present

## 2013-11-19 DIAGNOSIS — I2510 Atherosclerotic heart disease of native coronary artery without angina pectoris: Secondary | ICD-10-CM | POA: Diagnosis present

## 2013-11-19 DIAGNOSIS — K559 Vascular disorder of intestine, unspecified: Secondary | ICD-10-CM | POA: Diagnosis present

## 2013-11-19 DIAGNOSIS — K6289 Other specified diseases of anus and rectum: Secondary | ICD-10-CM | POA: Diagnosis present

## 2013-11-19 DIAGNOSIS — F411 Generalized anxiety disorder: Secondary | ICD-10-CM | POA: Diagnosis present

## 2013-11-19 DIAGNOSIS — Z8249 Family history of ischemic heart disease and other diseases of the circulatory system: Secondary | ICD-10-CM

## 2013-11-19 DIAGNOSIS — E1169 Type 2 diabetes mellitus with other specified complication: Secondary | ICD-10-CM | POA: Diagnosis present

## 2013-11-19 DIAGNOSIS — D126 Benign neoplasm of colon, unspecified: Secondary | ICD-10-CM | POA: Diagnosis present

## 2013-11-19 DIAGNOSIS — Z7982 Long term (current) use of aspirin: Secondary | ICD-10-CM

## 2013-11-19 DIAGNOSIS — E669 Obesity, unspecified: Secondary | ICD-10-CM

## 2013-11-19 DIAGNOSIS — I252 Old myocardial infarction: Secondary | ICD-10-CM

## 2013-11-19 DIAGNOSIS — D638 Anemia in other chronic diseases classified elsewhere: Secondary | ICD-10-CM | POA: Diagnosis present

## 2013-11-19 DIAGNOSIS — R509 Fever, unspecified: Secondary | ICD-10-CM

## 2013-11-19 DIAGNOSIS — K2970 Gastritis, unspecified, without bleeding: Secondary | ICD-10-CM | POA: Diagnosis present

## 2013-11-19 DIAGNOSIS — Z96659 Presence of unspecified artificial knee joint: Secondary | ICD-10-CM

## 2013-11-19 DIAGNOSIS — Z79899 Other long term (current) drug therapy: Secondary | ICD-10-CM

## 2013-11-19 DIAGNOSIS — I5032 Chronic diastolic (congestive) heart failure: Secondary | ICD-10-CM | POA: Diagnosis present

## 2013-11-19 DIAGNOSIS — Z833 Family history of diabetes mellitus: Secondary | ICD-10-CM

## 2013-11-19 DIAGNOSIS — L0291 Cutaneous abscess, unspecified: Secondary | ICD-10-CM

## 2013-11-19 LAB — BASIC METABOLIC PANEL
Calcium: 9.2 mg/dL (ref 8.4–10.5)
Creatinine, Ser: 1.5 mg/dL — ABNORMAL HIGH (ref 0.50–1.35)
GFR calc Af Amer: 53 mL/min — ABNORMAL LOW (ref >90)
GFR calc non Af Amer: 46 mL/min — ABNORMAL LOW (ref >90)
Glucose, Bld: 136 mg/dL — ABNORMAL HIGH (ref 70–99)

## 2013-11-19 LAB — URINALYSIS, ROUTINE W REFLEX MICROSCOPIC
Nitrite: NEGATIVE
Protein, ur: NEGATIVE mg/dL
Urobilinogen, UA: 1 mg/dL (ref 0.0–1.0)

## 2013-11-19 LAB — GLUCOSE, CAPILLARY
Glucose-Capillary: 122 mg/dL — ABNORMAL HIGH (ref 70–99)
Glucose-Capillary: 163 mg/dL — ABNORMAL HIGH (ref 70–99)
Glucose-Capillary: 123 mg/dL — ABNORMAL HIGH (ref 70–99)

## 2013-11-19 LAB — CBC
HCT: 19.9 % — ABNORMAL LOW (ref 39.0–52.0)
MCH: 29.9 pg (ref 26.0–34.0)
MCHC: 31.7 g/dL (ref 30.0–36.0)
MCV: 94.3 fL (ref 78.0–100.0)
Platelets: 199 10*3/uL (ref 150–400)
RDW: 17.8 % — ABNORMAL HIGH (ref 11.5–15.5)
WBC: 9.3 10*3/uL (ref 4.0–10.5)

## 2013-11-19 LAB — OCCULT BLOOD, POC DEVICE: Fecal Occult Bld: NEGATIVE

## 2013-11-19 LAB — ABO/RH: ABO/RH(D): A POS

## 2013-11-19 LAB — URINE MICROSCOPIC-ADD ON

## 2013-11-19 LAB — PREPARE RBC (CROSSMATCH)

## 2013-11-19 MED ORDER — ONDANSETRON HCL 4 MG PO TABS: 4.0000 mg | ORAL_TABLET | Freq: Four times a day (QID) | ORAL | Status: DC | PRN

## 2013-11-19 MED ORDER — ALLOPURINOL 300 MG PO TABS
300.0000 mg | ORAL_TABLET | Freq: Every day | ORAL | Status: DC
  Administered 2013-11-19 – 2013-11-23 (×5): 300 mg via ORAL
  Filled 2013-11-19 (×5): qty 1

## 2013-11-19 MED ORDER — FUROSEMIDE 40 MG PO TABS
40.0000 mg | ORAL_TABLET | Freq: Every day | ORAL | Status: DC
  Administered 2013-11-19 – 2013-11-23 (×5): 40 mg via ORAL
  Filled 2013-11-19 (×5): qty 1

## 2013-11-19 MED ORDER — ALPRAZOLAM 0.5 MG PO TABS
0.5000 mg | ORAL_TABLET | Freq: Two times a day (BID) | ORAL | Status: DC | PRN
  Administered 2013-11-21: 0.5 mg via ORAL
  Filled 2013-11-19: qty 1

## 2013-11-19 MED ORDER — LINAGLIPTIN 5 MG PO TABS
5.0000 mg | ORAL_TABLET | Freq: Every day | ORAL | Status: DC
  Administered 2013-11-19 – 2013-11-23 (×5): 5 mg via ORAL
  Filled 2013-11-19 (×5): qty 1

## 2013-11-19 MED ORDER — SODIUM CHLORIDE 0.9 % IV SOLN
INTRAVENOUS | Status: DC
  Administered 2013-11-20 – 2013-11-23 (×4): via INTRAVENOUS

## 2013-11-19 MED ORDER — SODIUM CHLORIDE 0.9 % IV SOLN
1750.0000 mg | INTRAVENOUS | Status: DC
  Filled 2013-11-19 (×3): qty 1750

## 2013-11-19 MED ORDER — HEPARIN SODIUM (PORCINE) 5000 UNIT/ML IJ SOLN
5000.0000 [IU] | Freq: Three times a day (TID) | INTRAMUSCULAR | Status: AC
  Administered 2013-11-19 – 2013-11-22 (×10): 5000 [IU] via SUBCUTANEOUS
  Filled 2013-11-19 (×11): qty 1

## 2013-11-19 MED ORDER — VANCOMYCIN HCL 10 G IV SOLR
2500.0000 mg | Freq: Once | INTRAVENOUS | Status: AC
  Administered 2013-11-19: 2500 mg via INTRAVENOUS
  Filled 2013-11-19: qty 2500

## 2013-11-19 MED ORDER — SODIUM CHLORIDE 0.9 % IV SOLN
INTRAVENOUS | Status: DC
  Administered 2013-11-19: 1000 mL via INTRAVENOUS

## 2013-11-19 MED ORDER — DEXTROSE 5 % IV SOLN
1.0000 g | Freq: Once | INTRAVENOUS | Status: AC
  Administered 2013-11-19: 1 g via INTRAVENOUS
  Filled 2013-11-19: qty 10

## 2013-11-19 MED ORDER — SODIUM CHLORIDE 0.9 % IJ SOLN
3.0000 mL | Freq: Two times a day (BID) | INTRAMUSCULAR | Status: DC
  Administered 2013-11-22 (×2): 3 mL via INTRAVENOUS

## 2013-11-19 MED ORDER — DOCUSATE SODIUM 100 MG PO CAPS
100.0000 mg | ORAL_CAPSULE | Freq: Two times a day (BID) | ORAL | Status: DC
  Administered 2013-11-19 – 2013-11-23 (×9): 100 mg via ORAL
  Filled 2013-11-19 (×9): qty 1

## 2013-11-19 MED ORDER — NITROGLYCERIN 0.4 MG SL SUBL: 0.4000 mg | SUBLINGUAL_TABLET | SUBLINGUAL | Status: DC | PRN

## 2013-11-19 MED ORDER — PIPERACILLIN-TAZOBACTAM 3.375 G IVPB
3.3750 g | Freq: Three times a day (TID) | INTRAVENOUS | Status: DC
  Administered 2013-11-19 – 2013-11-21 (×7): 3.375 g via INTRAVENOUS
  Filled 2013-11-19 (×10): qty 50

## 2013-11-19 MED ORDER — AMLODIPINE BESYLATE 5 MG PO TABS
10.0000 mg | ORAL_TABLET | Freq: Every day | ORAL | Status: DC
  Administered 2013-11-19 – 2013-11-23 (×5): 10 mg via ORAL
  Filled 2013-11-19 (×5): qty 2

## 2013-11-19 MED ORDER — GABAPENTIN 300 MG PO CAPS
300.0000 mg | ORAL_CAPSULE | Freq: Two times a day (BID) | ORAL | Status: DC
  Administered 2013-11-19 – 2013-11-23 (×9): 300 mg via ORAL
  Filled 2013-11-19 (×9): qty 1

## 2013-11-19 MED ORDER — FERROUS SULFATE 325 (65 FE) MG PO TABS
325.0000 mg | ORAL_TABLET | Freq: Three times a day (TID) | ORAL | Status: DC
  Administered 2013-11-19 – 2013-11-23 (×13): 325 mg via ORAL
  Filled 2013-11-19 (×12): qty 1

## 2013-11-19 MED ORDER — MORPHINE SULFATE 2 MG/ML IJ SOLN
2.0000 mg | INTRAMUSCULAR | Status: DC | PRN
  Administered 2013-11-19 – 2013-11-23 (×7): 2 mg via INTRAVENOUS
  Filled 2013-11-19 (×7): qty 1

## 2013-11-19 MED ORDER — SIMVASTATIN 20 MG PO TABS
20.0000 mg | ORAL_TABLET | Freq: Every day | ORAL | Status: DC
  Administered 2013-11-19 – 2013-11-22 (×4): 20 mg via ORAL
  Filled 2013-11-19 (×4): qty 1

## 2013-11-19 MED ORDER — METOPROLOL TARTRATE 50 MG PO TABS
50.0000 mg | ORAL_TABLET | Freq: Two times a day (BID) | ORAL | Status: DC
  Administered 2013-11-19 – 2013-11-23 (×9): 50 mg via ORAL
  Filled 2013-11-19 (×9): qty 1

## 2013-11-19 MED ORDER — ONDANSETRON HCL 4 MG/2ML IJ SOLN
4.0000 mg | Freq: Four times a day (QID) | INTRAMUSCULAR | Status: DC | PRN
  Administered 2013-11-20 – 2013-11-21 (×2): 4 mg via INTRAVENOUS
  Filled 2013-11-19 (×3): qty 2

## 2013-11-19 MED ORDER — POLYETHYLENE GLYCOL 3350 17 G PO PACK
17.0000 g | PACK | Freq: Two times a day (BID) | ORAL | Status: DC
  Administered 2013-11-19 – 2013-11-22 (×6): 17 g via ORAL
  Filled 2013-11-19 (×8): qty 1

## 2013-11-19 MED ORDER — ASPIRIN EC 325 MG PO TBEC
325.0000 mg | DELAYED_RELEASE_TABLET | Freq: Two times a day (BID) | ORAL | Status: DC
  Administered 2013-11-19 – 2013-11-23 (×9): 325 mg via ORAL
  Filled 2013-11-19 (×9): qty 1

## 2013-11-19 MED ORDER — INSULIN ASPART 100 UNIT/ML ~~LOC~~ SOLN
0.0000 [IU] | Freq: Three times a day (TID) | SUBCUTANEOUS | Status: DC
  Administered 2013-11-19: 2 [IU] via SUBCUTANEOUS
  Administered 2013-11-19: 3 [IU] via SUBCUTANEOUS
  Administered 2013-11-20 – 2013-11-21 (×3): 2 [IU] via SUBCUTANEOUS
  Administered 2013-11-21: 3 [IU] via SUBCUTANEOUS
  Administered 2013-11-22 – 2013-11-23 (×3): 2 [IU] via SUBCUTANEOUS

## 2013-11-19 MED ORDER — ACETAMINOPHEN 325 MG PO TABS
650.0000 mg | ORAL_TABLET | Freq: Four times a day (QID) | ORAL | Status: DC | PRN
  Administered 2013-11-19 – 2013-11-20 (×2): 650 mg via ORAL
  Filled 2013-11-19 (×2): qty 2

## 2013-11-19 MED ORDER — ACETAMINOPHEN 650 MG RE SUPP: 650.0000 mg | Freq: Four times a day (QID) | RECTAL | Status: DC | PRN

## 2013-11-19 MED ORDER — INSULIN ASPART 100 UNIT/ML ~~LOC~~ SOLN: 0.0000 [IU] | Freq: Every day | SUBCUTANEOUS | Status: DC

## 2013-11-19 MED ORDER — SODIUM CHLORIDE 0.9 % IV SOLN
INTRAVENOUS | Status: AC
  Administered 2013-11-19: 09:00:00 via INTRAVENOUS

## 2013-11-19 MED ORDER — OXYCODONE HCL 5 MG PO TABS
5.0000 mg | ORAL_TABLET | ORAL | Status: DC | PRN
  Administered 2013-11-19: 15 mg via ORAL
  Administered 2013-11-19 – 2013-11-20 (×3): 10 mg via ORAL
  Administered 2013-11-20: 15 mg via ORAL
  Administered 2013-11-21 – 2013-11-23 (×11): 10 mg via ORAL
  Administered 2013-11-23: 5 mg via ORAL
  Filled 2013-11-19 (×2): qty 2
  Filled 2013-11-19: qty 1
  Filled 2013-11-19 (×3): qty 2
  Filled 2013-11-19: qty 3
  Filled 2013-11-19 (×4): qty 2
  Filled 2013-11-19: qty 3
  Filled 2013-11-19 (×6): qty 2

## 2013-11-19 NOTE — ED Notes (Signed)
CRITICAL VALUE ALERT  Critical value received:  Hemoglobin 6.3  Date of notification:  10/24/13  Time of notification:  0632  Critical value read back:yes  Nurse who received alert:  Quincy Sheehan RN  MD notified (1st page):  Optiz  Time of first page:  [phone]  MD notified (2nd page):  Time of second page:  Responding MD:  Montez Morita  Time MD responded:  [phone]

## 2013-11-19 NOTE — Progress Notes (Signed)
Pt came up from ED this morning. Pt is in NAD. Will continue to monitor.

## 2013-11-19 NOTE — H&P (Signed)
[facility] History and Physical  NILAN IDDINGS LXB:[id number] DOB: 10/26/1946 DOA: 11/19/2013  Referring physician: emergency department PCP: [name], MD  Specialists:   Chief Complaint: fever, anemia  HPI: [name] is a 67 y.o. male  With a hx of ckd, chronic chf, DM2, HTN, chronic anemia, and osteoarthritis for which the pt is s/p R knee replacement one week ago. Pt presents from nursing home. Records reviewed. Pt recently noted to have suspicion for developing cellulitis over the pos surgical site and was given empiric keflex. Around this time, pt noted to be febrile and with a hgb below 7. He was referred to the ED for further management  Review of Systems:  Per above, the remainder of the 10pt ros reviewed and are neg  Past Medical History  Diagnosis Date  . Diastolic heart failure     Pedal edema, "a long time ago"  . Hypertension     Exercise induced  . NSVT (nonsustained ventricular tachycardia)     Exercise induced  . 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 Obesity   . Degenerative joint disease   . Benign prostatic hypertrophy     Nocturia  . Erectile dysfunction   . High cholesterol   . CAD (coronary artery disease)     a.  NSTEMI 10/2012 s/p DES to LAD & DES to 1st diagonal with residual diffuse nonobstructive dz in LCx/RCA   . Chronic kidney disease, stage 3, mod decreased GFR     Creatinine of 1.5 in 03/2009  . Diabetes mellitus, type II 12/10/2012.  Marland Kitchen Anxiety   . Depression     hx of  . Flu 2013    hx of   Past Surgical History  Procedure Laterality Date  . Cardiac catheterization      with stent placement  . Back surgery  2007    x2  . Hernia repair    . Total knee arthroplasty Right 11/15/2013    Procedure: RIGHT TOTAL KNEE ARTHROPLASTY;  Surgeon: Mauri Pole, MD;  Location: [facility]S;  Service: Orthopedics;  Laterality: Right;   Social History:  reports that he quit smoking about 20 years ago. His  smoking use included Cigarettes. He has a 30 pack-year smoking history. He has never used smokeless tobacco. He reports that he does not drink alcohol or use illicit drugs.  where does patient live--home, ALF, SNF? and with whom if at home?  Can patient participate in ADLs?  Allergies  Allergen Reactions  . Niacin And Related Other (See Comments)    Reaction: flushing and burning side effects even with extended release    Family History  Problem Relation Age of Onset  . Hypertension Other   . Cancer Other   . Heart disease    . Diabetes      (be sure to complete)  Prior to Admission medications   Medication Sig Start Date End Date Taking? Authorizing Provider  acetaminophen (TYLENOL) 325 MG tablet Take 1-2 tablets (325-650 mg total) by mouth every 6 (six) hours as needed. 11/17/13  Yes Lucille Passy Babish, PA-C  allopurinol (ZYLOPRIM) 300 MG tablet Take 300 mg by mouth daily.   Yes Historical Provider, MD  ALPRAZolam Duanne Moron) 0.5 MG tablet Take 0.5 mg by mouth 2 (two) times daily as needed for anxiety.   Yes Historical Provider, MD  amLODipine (NORVASC) 10 MG tablet Take 10 mg by mouth daily.   Yes Historical Provider,  MD  aspirin EC 325 MG tablet Take 1 tablet (325 mg total) by mouth 2 (two) times daily. Take for 4 weeks. 11/17/13 12/17/13 Yes Lucille Passy Babish, PA-C  docusate sodium 100 MG CAPS Take 100 mg by mouth 2 (two) times daily. 11/17/13  Yes Lucille Passy Babish, PA-C  ferrous sulfate 325 (65 FE) MG tablet Take 1 tablet (325 mg total) by mouth 3 (three) times daily after meals. 11/17/13  Yes Lucille Passy Babish, PA-C  furosemide (LASIX) 40 MG tablet Take 40 mg by mouth daily.  02/08/13  Yes Yehuda Savannah, MD  gabapentin (NEURONTIN) 300 MG capsule Take 300 mg by mouth 2 (two) times daily.    Yes Historical Provider, MD  metoprolol (LOPRESSOR) 50 MG tablet Take 50 mg by mouth 2 (two) times daily.   Yes Historical Provider, MD  NITROSTAT 0.4 MG SL tablet Place 0.4 mg under  the tongue every 5 (five) minutes as needed for chest pain.  06/12/13  Yes Historical Provider, MD  ONGLYZA 5 MG TABS tablet Take 5 mg by mouth daily.  02/09/13  Yes Historical Provider, MD  oxyCODONE (OXY IR/ROXICODONE) 5 MG immediate release tablet Take 1-3 tablets (5-15 mg total) by mouth every 4 (four) hours as needed for severe pain. 11/17/13  Yes Lucille Passy Babish, PA-C  polyethylene glycol (MIRALAX / GLYCOLAX) packet Take 17 g by mouth 2 (two) times daily. 11/17/13  Yes Lucille Passy Babish, PA-C  pravastatin (PRAVACHOL) 20 MG tablet Take 20 mg by mouth at bedtime.    Yes Historical Provider, MD   Physical Exam: Filed Vitals:   11/19/13 0532  BP: 132/53  Pulse: 89  Temp: 100 F (37.8 C)  TempSrc: Oral  Resp: 18  Height: 6' 3"  (1.905 m)  Weight: 129.275 kg (285 lb)  SpO2: 98%     General:  Awake, in nad  Eyes: PERRL B  ENT: membranes moist, dentition fair  Neck: trachea midline, neck supple  Cardiovascular: regular, s1, s2  Respiratory: normal resp effort, no wheezing  Abdomen: soft, nondistended  Skin: normal skin turgor, post-surgical site erythematous, edematous, tender  Musculoskeletal: per above, no clubbing, perfused  Psychiatric: mood/affect normal // no auditory/visual hallucinations  Neurologic: cn2-12 grossly intact, strength/sensation intact  Labs on Admission:  Basic Metabolic Panel:  Recent Labs Lab 11/16/13 0425 11/17/13 0430 11/19/13 0531  NA 140 139 139  K 4.3 4.3 4.1  CL 107 107 104  CO2 24 22 22   GLUCOSE 149* 145* 136*  BUN 26* 23 25*  CREATININE 1.31 1.33 1.50*  CALCIUM 8.5 8.4 9.2   Liver Function Tests: No results found for this basename: AST, ALT, ALKPHOS, BILITOT, PROT, ALBUMIN,  in the last 168 hours No results found for this basename: LIPASE, AMYLASE,  in the last 168 hours No results found for this basename: AMMONIA,  in the last 168 hours CBC:  Recent Labs Lab 11/16/13 0425 11/17/13 0430 11/19/13 0531  WBC 7.5  8.8 9.3  HGB 7.0* 7.3* 6.3*  HCT 22.1* 23.1* 19.9*  MCV 95.3 95.1 94.3  PLT 194 176 199   Cardiac Enzymes: No results found for this basename: CKTOTAL, CKMB, CKMBINDEX, TROPONINI,  in the last 168 hours  BNP (last 3 results) No results found for this basename: PROBNP,  in the last 8760 hours CBG:  Recent Labs Lab 11/16/13 0718 11/16/13 1139 11/16/13 1622 11/16/13 2120 11/17/13 0705  GLUCAP 110* 140* 95 132* 207*    Radiological Exams on Admission: Dg Abd Acute W/chest  11/19/2013   CLINICAL DATA:  Abdominal pain  EXAM: ACUTE ABDOMEN SERIES (ABDOMEN 2 VIEW & CHEST 1 VIEW)  COMPARISON:  11/08/2013  FINDINGS: Cardiac shadow is stable.  The lungs are clear bilaterally.  The abdomen shows a nonobstructive bowel gas pattern. Postsurgical changes are noted in the lower lumbar spine. No obstructive changes are seen. No abnormal mass or abnormal calcifications are noted. No free air is seen.  IMPRESSION: No acute abnormality in the chest and abdomen.   Electronically Signed   By: Inez Catalina M.D.   On: 11/19/2013 07:06    Assessment/Plan Principal Problem:   Cellulitis Active Problems:   Hypertension   Chronic kidney disease, stage 3, cardiorenal syndrome   Diabetes mellitus   S/P right TKA   Anemia   1. Fever with suspected cellulitis/possible post surgical joint infection 1. Will cont on empiric vanc and zosyn 2. Pan culture patient 3. Consider MRI of knee vs CT w/ contrast. Ideally would avoid CT given hx of CKD (see below) 4. Admit to med/tele 2. Anemia 1. Records reviewed 2. Suspected chronic anemia secondary to chronic disease vs renal failure 3. Transfuse as needed 4. Follow cbc 5. Will check stool for blood 6. Consider iron studies 3. DM 1. Cont on SSI 4. Hx chronic diastolic chf 1. euvolemic 2. Monitor i/o 5. CKD 3 1. Follow renal fx 2. Stable presently 6. HTN 1. Stable 2. Cont meds 7. DVT prophylaxis 1. Given high risk for DVT (hx of knee surgery),  would cont on heparin subQ  Code Status: Full (must indicate code status--if unknown or must be presumed, indicate so) Family Communication: Pt in room (indicate person spoken with, if applicable, with phone number if by telephone) Disposition Plan: Pending (indicate anticipated LOS)  Time spent: 16mn  Laren Whaling, SM[facility] Pager [phone] If 7PM-7AM, please contact night-coverage [email or web address] Password [email or web address]11/28/2014, 7:39 AM

## 2013-11-19 NOTE — ED Notes (Signed)
Pt complain of back pain from lying in bed.

## 2013-11-19 NOTE — ED Notes (Signed)
Pt sent by EMS from the [facility] w/ low hemoglobin. Pt state she does not know why he is here.

## 2013-11-19 NOTE — ED Notes (Signed)
Occult blood was negative, EDP notified.

## 2013-11-19 NOTE — ED Provider Notes (Signed)
CSN: [id number]     Arrival date & time 11/19/13  0532 History   First MD Initiated Contact with Patient 11/19/13 0530     Chief Complaint  Patient presents with  . Hgb 6.7    (Consider location/radiation/quality/duration/timing/severity/associated sxs/prior Treatment) HPI History provided by patient - recent knee replacement, now is at skilled nursing center. Patient is not sure why but he had blood work done, and was sent here for a low hemoglobin. He had been having some abdominal discomfort but denies any at this time. No nausea vomiting. He denies any complaints at this time. He denies any black or tarry stools. He believes that he has been anemic in the past but has never required a blood transfusion. He denies any difficulty urinating. He denies any cough or difficulty breathing.  History from previous records reviewed: At nursing facility yesterday was noted to have a temperature of 101, sent here for blood work and cultures and found to have a hemoglobin of 6.7 and a creatinine of 1.5 and was sent to the emergency department for further evaluation. He had blood cultures drawn.  Past Medical History  Diagnosis Date  . Diastolic heart failure     Pedal edema, "a long time ago"  . Hypertension     Exercise induced  . NSVT (nonsustained ventricular tachycardia)     Exercise induced  . 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 Obesity   . Degenerative joint disease   . Benign prostatic hypertrophy     Nocturia  . Erectile dysfunction   . High cholesterol   . CAD (coronary artery disease)     a.  NSTEMI 10/2012 s/p DES to LAD & DES to 1st diagonal with residual diffuse nonobstructive dz in LCx/RCA   . Chronic kidney disease, stage 3, mod decreased GFR     Creatinine of 1.5 in 03/2009  . Diabetes mellitus, type II 12/10/2012.  Marland Kitchen Anxiety   . Depression     hx of  . Flu 2013    hx of   Past Surgical History  Procedure Laterality Date  . Cardiac  catheterization      with stent placement  . Back surgery  2007    x2  . Hernia repair    . Total knee arthroplasty Right 11/15/2013    Procedure: RIGHT TOTAL KNEE ARTHROPLASTY;  Surgeon: Mauri Pole, MD;  Location: [facility];  Service: Orthopedics;  Laterality: Right;   Family History  Problem Relation Age of Onset  . Hypertension Other   . Cancer Other   . Heart disease    . Diabetes     History  Substance Use Topics  . Smoking status: Former Smoker -- 1.00 packs/day for 30 years    Types: Cigarettes    Quit date: 12/23/1992  . Smokeless tobacco: Never Used  . Alcohol Use: No    Review of Systems  Constitutional: Positive for fever. Negative for chills.  Eyes: Negative for visual disturbance.  Respiratory: Negative for shortness of breath.   Cardiovascular: Negative for chest pain.  Gastrointestinal: Negative for vomiting.  Genitourinary: Negative for dysuria.  Musculoskeletal: Negative for back pain, neck pain and neck stiffness.  Skin: Negative for rash.  Neurological: Negative for headaches.  All other systems reviewed and are negative.    Allergies  Niacin and related  Home Medications   Current Outpatient Rx  Name  Route  Sig  Dispense  Refill  .  acetaminophen (TYLENOL) 325 MG tablet   Oral   Take 1-2 tablets (325-650 mg total) by mouth every 6 (six) hours as needed.         Marland Kitchen allopurinol (ZYLOPRIM) 300 MG tablet   Oral   Take 300 mg by mouth daily.         Marland Kitchen ALPRAZolam (XANAX) 0.5 MG tablet   Oral   Take 0.5 mg by mouth 2 (two) times daily as needed for anxiety.         Marland Kitchen amLODipine (NORVASC) 10 MG tablet   Oral   Take 10 mg by mouth daily.         Marland Kitchen aspirin EC 325 MG tablet   Oral   Take 1 tablet (325 mg total) by mouth 2 (two) times daily. Take for 4 weeks.   60 tablet   0     Take for 4 weeks.   . docusate sodium 100 MG CAPS   Oral   Take 100 mg by mouth 2 (two) times daily.   10 capsule   0   . ferrous sulfate 325 (65 FE)  MG tablet   Oral   Take 1 tablet (325 mg total) by mouth 3 (three) times daily after meals.      3   . furosemide (LASIX) 40 MG tablet   Oral   Take 40 mg by mouth daily.          Marland Kitchen gabapentin (NEURONTIN) 300 MG capsule   Oral   Take 300 mg by mouth 2 (two) times daily.          . metoprolol (LOPRESSOR) 50 MG tablet   Oral   Take 50 mg by mouth 2 (two) times daily.         Marland Kitchen NITROSTAT 0.4 MG SL tablet   Sublingual   Place 0.4 mg under the tongue every 5 (five) minutes as needed for chest pain.          . ONGLYZA 5 MG TABS tablet   Oral   Take 5 mg by mouth daily.          Marland Kitchen oxyCODONE (OXY IR/ROXICODONE) 5 MG immediate release tablet   Oral   Take 1-3 tablets (5-15 mg total) by mouth every 4 (four) hours as needed for severe pain.   120 tablet   0   . polyethylene glycol (MIRALAX / GLYCOLAX) packet   Oral   Take 17 g by mouth 2 (two) times daily.   14 each   0   . pravastatin (PRAVACHOL) 20 MG tablet   Oral   Take 20 mg by mouth at bedtime.           BP 132/53  Pulse 89  Temp(Src) 100 F (37.8 C) (Oral)  Resp 18  Ht 6' 3"  (1.905 m)  Wt 285 lb (129.275 kg)  BMI 35.62 kg/m2  SpO2 98% Physical Exam  Constitutional: He is oriented to person, place, and time. He appears well-developed and well-nourished.  HENT:  Head: Normocephalic and atraumatic.  Eyes: EOM are normal. Pupils are equal, round, and reactive to light.  Neck: Neck supple.  Cardiovascular: Normal rate, regular rhythm and intact distal pulses.   Pulmonary/Chest: Effort normal and breath sounds normal. No respiratory distress.  Abdominal: Soft. Bowel sounds are normal. He exhibits no distension. There is no tenderness.  Genitourinary:  Brown stool guaiac negative  Musculoskeletal:  No unilateral calf tenderness. No erythema or cords.  Neurological: He is alert  and oriented to person, place, and time.  Skin: Skin is warm and dry.    ED Course  Procedures (including critical care  time) Labs Review Labs Reviewed  CBC - Abnormal; Notable for the following:    RBC 2.11 (*)    Hemoglobin 6.3 (*)    HCT 19.9 (*)    RDW 17.8 (*)    All other components within normal limits  BASIC METABOLIC PANEL - Abnormal; Notable for the following:    Glucose, Bld 136 (*)    BUN 25 (*)    Creatinine, Ser 1.50 (*)    GFR calc non Af Amer 46 (*)    GFR calc Af Amer 53 (*)    All other components within normal limits  URINALYSIS, ROUTINE W REFLEX MICROSCOPIC  TYPE AND SCREEN   Imaging Review Dg Abd Acute W/chest  11/19/2013   CLINICAL DATA:  Abdominal pain  EXAM: ACUTE ABDOMEN SERIES (ABDOMEN 2 VIEW & CHEST 1 VIEW)  COMPARISON:  11/08/2013  FINDINGS: Cardiac shadow is stable.  The lungs are clear bilaterally.  The abdomen shows a nonobstructive bowel gas pattern. Postsurgical changes are noted in the lower lumbar spine. No obstructive changes are seen. No abnormal mass or abnormal calcifications are noted. No free air is seen.  IMPRESSION: No acute abnormality in the chest and abdomen.   Electronically Signed   By: Inez Catalina M.D.   On: 11/19/2013 07:06    IV fluids. IV antibiotics. Shannon.Adler - medicine consult for admission d/w hospitalist - will admit, UA pending  MDM  Diagnosis: Anemia, renal insufficiency, fever  Previous records reviewed, recent knee replacement, fever to 101 at nursing facility Evaluated with labs and imaging Stool is Hemoccult negative, unknown source of anemia    Teressa Lower, MD 11/19/13 [id number]

## 2013-11-19 NOTE — Progress Notes (Signed)
ANTIBIOTIC CONSULT NOTE - INITIAL  Pharmacy Consult for Vancomycin & Zosyn Indication: cellulitis  Allergies  Allergen Reactions  . Niacin And Related Other (See Comments)    Reaction: flushing and burning side effects even with extended release    Patient Measurements: Height: 6' 3"  (190.5 cm) Weight: 285 lb (129.275 kg) IBW/kg (Calculated) : 84.5  Vital Signs: Temp: 98.5 F (36.9 C) (11/28 0817) Temp src: Oral (11/28 0817) BP: 116/65 mmHg (11/28 0817) Pulse Rate: 86 (11/28 0817) Intake/Output from previous day:   Intake/Output from this shift:    Labs:  Recent Labs  11/17/13 0430 11/19/13 0531  WBC 8.8 9.3  HGB 7.3* 6.3*  PLT 176 199  CREATININE 1.33 1.50*   Estimated Creatinine Clearance: 68.3 ml/min (by C-G formula based on Cr of 1.5). No results found for this basename: VANCOTROUGH, Corlis Leak, VANCORANDOM, GENTTROUGH, GENTPEAK, GENTRANDOM, TOBRATROUGH, TOBRAPEAK, TOBRARND, AMIKACINPEAK, AMIKACINTROU, AMIKACIN,  in the last 72 hours   Microbiology: Recent Results (from the past 720 hour(s))  SURGICAL PCR SCREEN     Status: None   Collection Time    11/08/13  2:30 PM      Result Value Range Status   MRSA, PCR NEGATIVE  NEGATIVE Final   Staphylococcus aureus NEGATIVE  NEGATIVE Final   Comment:            The Xpert SA Assay (FDA     approved for NASAL specimens     in patients over 59 years of age),     is one component of     a comprehensive surveillance     program.  Test performance has     been validated by [employer] for patients greater     than or equal to 70 year old.     It is not intended     to diagnose infection nor to     guide or monitor treatment.    Medical History: Past Medical History  Diagnosis Date  . Diastolic heart failure     Pedal edema, "a long time ago"  . Hypertension     Exercise induced  . NSVT (nonsustained ventricular tachycardia)     Exercise induced  . Tobacco abuse, in remission     30 pack years, quit  1994  . Obesity   . Degenerative joint disease   . Benign prostatic hypertrophy     Nocturia  . Erectile dysfunction   . High cholesterol   . CAD (coronary artery disease)     a.  NSTEMI 10/2012 s/p DES to LAD & DES to 1st diagonal with residual diffuse nonobstructive dz in LCx/RCA   . Chronic kidney disease, stage 3, mod decreased GFR     Creatinine of 1.5 in 03/2009  . Diabetes mellitus, type II 12/10/2012.  Marland Kitchen Anxiety   . Depression     hx of  . Flu 2013    hx of    Medications:  Scheduled:  . sodium chloride   Intravenous STAT  . allopurinol  300 mg Oral Daily  . amLODipine  10 mg Oral Daily  . aspirin EC  325 mg Oral BID  . docusate sodium  100 mg Oral BID  . ferrous sulfate  325 mg Oral TID PC  . furosemide  40 mg Oral Daily  . gabapentin  300 mg Oral BID  . heparin  5,000 Units Subcutaneous Q8H  . insulin aspart  0-15 Units Subcutaneous TID WC  . insulin aspart  0-5 Units Subcutaneous QHS  . linagliptin  5 mg Oral Q breakfast  . metoprolol  50 mg Oral BID  . polyethylene glycol  17 g Oral BID  . simvastatin  20 mg Oral q1800  . sodium chloride  3 mL Intravenous Q12H   Assessment: 67 yo M who is s/p rt TKA on 11/25.  He developed cellulitis around his surgical site and was started on cephalexin as an outpatient.   He was sent to [facility] ED due to fever which has now resolved.  WBC is normal.  He is diabetic with hx of CKD.  Normalized CrCl ~ 45-5m/min.  Vancomycin 11/28>> Zosyn 11/28>> Rocephin 11/28>>11/28  Goal of Therapy:  Vancomycin trough level 15-20 mcg/ml until joint/bone involvement ruled out  Plan:  Zosyn 3.375gm IV Q8h to be infused over 4hrs Vancomycin 25041mIV x1 now then 175053mV q24h Check Vancomycin trough at steady state Monitor renal function and cx data   LilBiagio Borg/28/2014,8:59 AM

## 2013-11-19 NOTE — Progress Notes (Signed)
Pt's Hgb is 6.3 and pt is to receive 2 units of blood today. IV abx are also due to give to pt including Zosyn which is usually run over a four-hour period. In order to allow pt to get his two units of blood within a timely manner, pharmacy has said it would be okay to run the zosyn in over 30 minutes in between units. Will continue to monitor and report information off to next nurse.

## 2013-11-20 ENCOUNTER — Observation Stay (HOSPITAL_COMMUNITY): Payer: Medicare Other

## 2013-11-20 DIAGNOSIS — I10 Essential (primary) hypertension: Secondary | ICD-10-CM

## 2013-11-20 DIAGNOSIS — K5900 Constipation, unspecified: Secondary | ICD-10-CM

## 2013-11-20 DIAGNOSIS — D649 Anemia, unspecified: Secondary | ICD-10-CM

## 2013-11-20 DIAGNOSIS — E119 Type 2 diabetes mellitus without complications: Secondary | ICD-10-CM

## 2013-11-20 LAB — PROTIME-INR: Prothrombin Time: 16.2 seconds — ABNORMAL HIGH (ref 11.6–15.2)

## 2013-11-20 LAB — TYPE AND SCREEN
ABO/RH(D): A POS
Antibody Screen: NEGATIVE
Unit division: 0
Unit division: 0

## 2013-11-20 LAB — COMPREHENSIVE METABOLIC PANEL
ALT: 7 U/L (ref 0–53)
AST: 13 U/L (ref 0–37)
Albumin: 2.2 g/dL — ABNORMAL LOW (ref 3.5–5.2)
Alkaline Phosphatase: 59 U/L (ref 39–117)
CO2: 24 mEq/L (ref 19–32)
Chloride: 102 mEq/L (ref 96–112)
Creatinine, Ser: 1.49 mg/dL — ABNORMAL HIGH (ref 0.50–1.35)
GFR calc non Af Amer: 46 mL/min — ABNORMAL LOW (ref >90)
Potassium: 4.1 mEq/L (ref 3.5–5.1)
Total Bilirubin: 0.8 mg/dL (ref 0.3–1.2)

## 2013-11-20 LAB — CBC
HCT: 21.6 % — ABNORMAL LOW (ref 39.0–52.0)
MCH: 30.9 pg (ref 26.0–34.0)
MCHC: 32.9 g/dL (ref 30.0–36.0)
MCV: 93.9 fL (ref 78.0–100.0)
Platelets: 189 10*3/uL (ref 150–400)
RDW: 17.2 % — ABNORMAL HIGH (ref 11.5–15.5)
WBC: 8.3 10*3/uL (ref 4.0–10.5)

## 2013-11-20 LAB — HEMOGLOBIN AND HEMATOCRIT, BLOOD
HCT: 22.1 % — ABNORMAL LOW (ref 39.0–52.0)
Hemoglobin: 7.2 g/dL — ABNORMAL LOW (ref 13.0–17.0)

## 2013-11-20 LAB — SEDIMENTATION RATE: Sed Rate: 110 mm/hr — ABNORMAL HIGH (ref 0–16)

## 2013-11-20 LAB — IRON AND TIBC
Iron: 13 ug/dL — ABNORMAL LOW (ref 42–135)
Saturation Ratios: 10 % — ABNORMAL LOW (ref 20–55)
TIBC: 124 ug/dL — ABNORMAL LOW (ref 215–435)

## 2013-11-20 LAB — GLUCOSE, CAPILLARY: Glucose-Capillary: 125 mg/dL — ABNORMAL HIGH (ref 70–99)

## 2013-11-20 LAB — LACTATE DEHYDROGENASE: LDH: 156 U/L (ref 94–250)

## 2013-11-20 LAB — C-REACTIVE PROTEIN: CRP: 23.9 mg/dL — ABNORMAL HIGH (ref <0.60)

## 2013-11-20 MED ORDER — SORBITOL 70 % SOLN
960.0000 mL | TOPICAL_OIL | ORAL | Status: AC
  Administered 2013-11-20: 960 mL via RECTAL
  Filled 2013-11-20: qty 240

## 2013-11-20 MED ORDER — POLYETHYLENE GLYCOL 3350 17 G PO PACK
17.0000 g | PACK | Freq: Every day | ORAL | Status: DC
  Administered 2013-11-21 – 2013-11-23 (×2): 17 g via ORAL

## 2013-11-20 MED ORDER — CHLORPROMAZINE HCL 25 MG PO TABS
25.0000 mg | ORAL_TABLET | Freq: Three times a day (TID) | ORAL | Status: DC | PRN
  Administered 2013-11-20 – 2013-11-22 (×4): 25 mg via ORAL
  Filled 2013-11-20 (×4): qty 1

## 2013-11-20 NOTE — Progress Notes (Signed)
Utilization Review completed.  

## 2013-11-20 NOTE — Progress Notes (Signed)
Pt had one episode of green emesis. Dose of ondansetron was administered will continue to monitor.

## 2013-11-20 NOTE — Progress Notes (Signed)
We have received a consult to see this pt due to his being s/p TKR about 1 week ago.  Since he is no longer acute, he would not typically be seen over the weekend.  We will plan to see him Monday AM.

## 2013-11-20 NOTE — Progress Notes (Signed)
[facility] PROGRESS NOTE  JERARD BAYS MPN:[id number] DOB: 19-Jun-1946 DOA: 11/19/2013 PCP: [name], MD  Assessment/Plan: 1. Fever with suspected cellulitis/possible post surgical joint infection  1. Pt was initially started on empiric vanc and zosyn 2. Ortho consulted as questionable cellulitis is over post-surgical site for recent knee replacement 3. Appreciate Ortho recs. Recs for holding abx 2. Anemia  1. Records reviewed 2. Suspected chronic anemia secondary to chronic disease vs renal failure 3. Transfused 2 units, however hgb increased only by 0.8gm 4. Follow cbc 5. Stool neg for blood 6. Consider iron studies and hemolytic w/u 3. DM  1. Cont on SSI 4. Hx chronic diastolic chf  1. Remains euvolemic 2. Monitor i/o 5. CKD 3  1. Follow renal fx 2. Stable remains presently 6. HTN  1. Stable 2. Cont meds 7. DVT prophylaxis  1. Given high risk for DVT (hx of recent knee surgery), would cont on heparin subQ 8. Constipation 1. Will order SMOG enema 2. Cont on bowel regimen as tolerated  Code Status: Full Family Communication: Pt in room (indicate person spoken with, relationship, and if by phone, the number) Disposition Plan: Pending  Antibiotics:  Vancomycin 11/19/13>>>  Zosyn 11/19/13>>>  HPI/Subjective: Reports feeling constipated and with hiccups.  Objective: Filed Vitals:   11/19/13 2100 11/19/13 2215 11/19/13 2306 11/20/13 0510  BP: 129/54 150/59 147/49 120/61  Pulse: 72 74 68 71  Temp: 98.3 F (36.8 C) 99.1 F (37.3 C) 98 F (36.7 C) 99.4 F (37.4 C)  TempSrc: Oral Oral Oral Oral  Resp: 18 18 18 18   Height:      Weight:      SpO2: 100% 98% 98% 99%    Intake/Output Summary (Last 24 hours) at 11/20/13 0841 Last data filed at 11/20/13 0086  Gross per 24 hour  Intake   1225 ml  Output   2675 ml  Net  -1450 ml   Filed Weights   11/19/13 0532 11/19/13 0829  Weight: 129.275 kg (285 lb) 129.275 kg (285 lb)     Exam:   General:  Awake, in nad  Cardiovascular: regular, s1, s2  Respiratory: normal resp effort, no wheezing  Abdomen: soft, nondistended  Musculoskeletal: perfused, no clubbing   Data Reviewed: Basic Metabolic Panel:  Recent Labs Lab 11/16/13 0425 11/17/13 0430 11/19/13 0531 11/20/13 0601  NA 140 139 139 138  K 4.3 4.3 4.1 4.1  CL 107 107 104 102  CO2 24 22 22 24   GLUCOSE 149* 145* 136* 130*  BUN 26* 23 25* 25*  CREATININE 1.31 1.33 1.50* 1.49*  CALCIUM 8.5 8.4 9.2 9.1   Liver Function Tests:  Recent Labs Lab 11/20/13 0601  AST 13  ALT 7  ALKPHOS 59  BILITOT 0.8  PROT 6.5  ALBUMIN 2.2*   No results found for this basename: LIPASE, AMYLASE,  in the last 168 hours No results found for this basename: AMMONIA,  in the last 168 hours CBC:  Recent Labs Lab 11/16/13 0425 11/17/13 0430 11/19/13 0531 11/20/13 0025 11/20/13 0601  WBC 7.5 8.8 9.3  --  8.3  HGB 7.0* 7.3* 6.3* 7.2* 7.1*  HCT 22.1* 23.1* 19.9* 22.1* 21.6*  MCV 95.3 95.1 94.3  --  93.9  PLT 194 176 199  --  189   Cardiac Enzymes: No results found for this basename: CKTOTAL, CKMB, CKMBINDEX, TROPONINI,  in the last 168 hours BNP (last 3 results) No results found for this basename: PROBNP,  in the last 8760 hours CBG:  Recent Labs Lab 11/19/13 0814 11/19/13 1141 11/19/13 1614 11/19/13 2047 11/20/13 0722  GLUCAP 122* 163* 123* 124* 125*    No results found for this or any previous visit (from the past 240 hour(s)).   Studies: Dg Abd Acute W/chest  11/19/2013   CLINICAL DATA:  Abdominal pain  EXAM: ACUTE ABDOMEN SERIES (ABDOMEN 2 VIEW & CHEST 1 VIEW)  COMPARISON:  11/08/2013  FINDINGS: Cardiac shadow is stable.  The lungs are clear bilaterally.  The abdomen shows a nonobstructive bowel gas pattern. Postsurgical changes are noted in the lower lumbar spine. No obstructive changes are seen. No abnormal mass or abnormal calcifications are noted. No free air is seen.  IMPRESSION: No  acute abnormality in the chest and abdomen.   Electronically Signed   By: Inez Catalina M.D.   On: 11/19/2013 07:06    Scheduled Meds: . allopurinol  300 mg Oral Daily  . amLODipine  10 mg Oral Daily  . aspirin EC  325 mg Oral BID  . docusate sodium  100 mg Oral BID  . ferrous sulfate  325 mg Oral TID PC  . furosemide  40 mg Oral Daily  . gabapentin  300 mg Oral BID  . heparin  5,000 Units Subcutaneous Q8H  . insulin aspart  0-15 Units Subcutaneous TID WC  . insulin aspart  0-5 Units Subcutaneous QHS  . linagliptin  5 mg Oral Q breakfast  . metoprolol  50 mg Oral BID  . piperacillin-tazobactam (ZOSYN)  IV  3.375 g Intravenous Q8H  . polyethylene glycol  17 g Oral BID  . simvastatin  20 mg Oral q1800  . sodium chloride  3 mL Intravenous Q12H  . vancomycin  1,750 mg Intravenous Q24H   Continuous Infusions: . sodium chloride 1,000 mL (11/19/13 0656)  . sodium chloride 75 mL/hr at 11/19/13 5364    Principal Problem:   Cellulitis Active Problems:   Hypertension   Chronic kidney disease, stage 3, cardiorenal syndrome   Diabetes mellitus   S/P right TKA   Anemia  Time spent: 57mn  Adriell Polansky, [facility] Pager [phone] If 7PM-7AM, please contact night-coverage at [email or web address], password [email or web address] [facility] Center11/29/2014, 8:41 AM  LOS: 1 day

## 2013-11-20 NOTE — Consult Note (Signed)
Reason for Consult: possible periprosthetic knee infection  Referring Physician: Dr Thurston Pounds is an 67 y.o. male.  HPI: s/p right tka recently discharged to [facility], concern over anemia and question of infection. He denies weakness or dizziness or increased knee pain   Past Medical History  Diagnosis Date  . Diastolic heart failure     Pedal edema, "a long time ago"  . Hypertension     Exercise induced  . NSVT (nonsustained ventricular tachycardia)     Exercise induced  . 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 Obesity   . Degenerative joint disease   . Benign prostatic hypertrophy     Nocturia  . Erectile dysfunction   . High cholesterol   . CAD (coronary artery disease)     a.  NSTEMI 10/2012 s/p DES to LAD & DES to 1st diagonal with residual diffuse nonobstructive dz in LCx/RCA   . Chronic kidney disease, stage 3, mod decreased GFR     Creatinine of 1.5 in 03/2009  . Diabetes mellitus, type II 12/10/2012.  Marland Kitchen Anxiety   . Depression     hx of  . Flu 2013    hx of    Past Surgical History  Procedure Laterality Date  . Cardiac catheterization      with stent placement  . Back surgery  2007    x2  . Hernia repair    . Total knee arthroplasty Right 11/15/2013    Procedure: RIGHT TOTAL KNEE ARTHROPLASTY;  Surgeon: Mauri Pole, MD;  Location: [facility];  Service: Orthopedics;  Laterality: Right;    Family History  Problem Relation Age of Onset  . Hypertension Other   . Cancer Other   . Heart disease    . Diabetes      Social History:  reports that he quit smoking about 20 years ago. His smoking use included Cigarettes. He has a 30 pack-year smoking history. He has never used smokeless tobacco. He reports that he does not drink alcohol or use illicit drugs.  Allergies:  Allergies  Allergen Reactions  . Niacin And Related Other (See Comments)    Reaction: flushing and burning side effects even with extended release     Medications: I have reviewed the patient's current medications.  Results for orders placed during the hospital encounter of 11/19/13 (from the past 48 hour(s))  CBC     Status: Abnormal   Collection Time    11/19/13  5:31 AM      Result Value Range   WBC 9.3  4.0 - 10.5 K/uL   RBC 2.11 (*) 4.22 - 5.81 MIL/uL   Hemoglobin 6.3 (*) 13.0 - 17.0 g/dL   Comment: RESULT REPEATED AND VERIFIED     CRITICAL RESULT CALLED TO, READ BACK BY AND VERIFIED WITH:     RON HAYMORE RN ON [id number] AT 0630 BY RESSEGGER R   HCT 19.9 (*) 39.0 - 52.0 %   MCV 94.3  78.0 - 100.0 fL   MCH 29.9  26.0 - 34.0 pg   MCHC 31.7  30.0 - 36.0 g/dL   RDW 17.8 (*) 11.5 - 15.5 %   Platelets 199  150 - 400 K/uL  BASIC METABOLIC PANEL     Status: Abnormal   Collection Time    11/19/13  5:31 AM      Result Value Range   Sodium 139  135 - 145 mEq/L  Potassium 4.1  3.5 - 5.1 mEq/L   Chloride 104  96 - 112 mEq/L   CO2 22  19 - 32 mEq/L   Glucose, Bld 136 (*) 70 - 99 mg/dL   BUN 25 (*) 6 - 23 mg/dL   Creatinine, Ser 1.50 (*) 0.50 - 1.35 mg/dL   Calcium 9.2  8.4 - 10.5 mg/dL   GFR calc non Af Amer 46 (*) >90 mL/min   GFR calc Af Amer 53 (*) >90 mL/min   Comment: (NOTE)     The eGFR has been calculated using the CKD EPI equation.     This calculation has not been validated in all clinical situations.     eGFR's persistently <90 mL/min signify possible Chronic Kidney     Disease.  TYPE AND SCREEN     Status: None   Collection Time    11/19/13  5:32 AM      Result Value Range   ABO/RH(D) A POS     Antibody Screen NEG     Sample Expiration 11/22/2013     Unit Number [id number]     Blood Component Type RED CELLS,LR     Unit division 00     Status of Unit ISSUED,FINAL     Transfusion Status OK TO TRANSFUSE     Crossmatch Result Compatible     Unit Number [id number]     Blood Component Type RED CELLS,LR     Unit division 00     Status of Unit ISSUED,FINAL     Transfusion Status OK TO TRANSFUSE      Crossmatch Result Compatible    ABO/RH     Status: None   Collection Time    11/19/13  6:00 AM      Result Value Range   ABO/RH(D) A POS    OCCULT BLOOD, POC DEVICE     Status: None   Collection Time    11/19/13  6:13 AM      Result Value Range   Fecal Occult Bld NEGATIVE  NEGATIVE  URINALYSIS, ROUTINE W REFLEX MICROSCOPIC     Status: Abnormal   Collection Time    11/19/13  7:59 AM      Result Value Range   Color, Urine YELLOW  YELLOW   APPearance CLEAR  CLEAR   Specific Gravity, Urine 1.010  1.005 - 1.030   pH 5.5  5.0 - 8.0   Glucose, UA NEGATIVE  NEGATIVE mg/dL   Hgb urine dipstick TRACE (*) NEGATIVE   Bilirubin Urine NEGATIVE  NEGATIVE   Ketones, ur NEGATIVE  NEGATIVE mg/dL   Protein, ur NEGATIVE  NEGATIVE mg/dL   Urobilinogen, UA 1.0  0.0 - 1.0 mg/dL   Nitrite NEGATIVE  NEGATIVE   Leukocytes, UA NEGATIVE  NEGATIVE  URINE MICROSCOPIC-ADD ON     Status: None   Collection Time    11/19/13  7:59 AM      Result Value Range   RBC / HPF 0-2  <3 RBC/hpf  GLUCOSE, CAPILLARY     Status: Abnormal   Collection Time    11/19/13  8:14 AM      Result Value Range   Glucose-Capillary 122 (*) 70 - 99 mg/dL   Comment 1 Notify RN    GLUCOSE, CAPILLARY     Status: Abnormal   Collection Time    11/19/13 11:41 AM      Result Value Range   Glucose-Capillary 163 (*) 70 - 99 mg/dL   Comment 1 Notify RN  PREPARE RBC (CROSSMATCH)     Status: None   Collection Time    11/19/13 12:30 PM      Result Value Range   Order Confirmation ORDER PROCESSED BY BLOOD BANK    GLUCOSE, CAPILLARY     Status: Abnormal   Collection Time    11/19/13  4:14 PM      Result Value Range   Glucose-Capillary 123 (*) 70 - 99 mg/dL  GLUCOSE, CAPILLARY     Status: Abnormal   Collection Time    11/19/13  8:47 PM      Result Value Range   Glucose-Capillary 124 (*) 70 - 99 mg/dL  HEMOGLOBIN AND HEMATOCRIT, BLOOD     Status: Abnormal   Collection Time    11/20/13 12:25 AM      Result Value Range   Hemoglobin  7.2 (*) 13.0 - 17.0 g/dL   Comment: POST TRANSFUSION SPECIMEN   HCT 22.1 (*) 39.0 - 52.0 %  COMPREHENSIVE METABOLIC PANEL     Status: Abnormal   Collection Time    11/20/13  6:01 AM      Result Value Range   Sodium 138  135 - 145 mEq/L   Potassium 4.1  3.5 - 5.1 mEq/L   Chloride 102  96 - 112 mEq/L   CO2 24  19 - 32 mEq/L   Glucose, Bld 130 (*) 70 - 99 mg/dL   BUN 25 (*) 6 - 23 mg/dL   Creatinine, Ser 1.49 (*) 0.50 - 1.35 mg/dL   Calcium 9.1  8.4 - 10.5 mg/dL   Total Protein 6.5  6.0 - 8.3 g/dL   Albumin 2.2 (*) 3.5 - 5.2 g/dL   AST 13  0 - 37 U/L   ALT 7  0 - 53 U/L   Alkaline Phosphatase 59  39 - 117 U/L   Total Bilirubin 0.8  0.3 - 1.2 mg/dL   GFR calc non Af Amer 46 (*) >90 mL/min   GFR calc Af Amer 54 (*) >90 mL/min   Comment: (NOTE)     The eGFR has been calculated using the CKD EPI equation.     This calculation has not been validated in all clinical situations.     eGFR's persistently <90 mL/min signify possible Chronic Kidney     Disease.  CBC     Status: Abnormal   Collection Time    11/20/13  6:01 AM      Result Value Range   WBC 8.3  4.0 - 10.5 K/uL   RBC 2.30 (*) 4.22 - 5.81 MIL/uL   Hemoglobin 7.1 (*) 13.0 - 17.0 g/dL   HCT 21.6 (*) 39.0 - 52.0 %   MCV 93.9  78.0 - 100.0 fL   MCH 30.9  26.0 - 34.0 pg   MCHC 32.9  30.0 - 36.0 g/dL   RDW 17.2 (*) 11.5 - 15.5 %   Platelets 189  150 - 400 K/uL  PROTIME-INR     Status: Abnormal   Collection Time    11/20/13  6:01 AM      Result Value Range   Prothrombin Time 16.2 (*) 11.6 - 15.2 seconds   INR 1.33  0.00 - 1.49  APTT     Status: Abnormal   Collection Time    11/20/13  6:01 AM      Result Value Range   aPTT 44 (*) 24 - 37 seconds   Comment:            IF  BASELINE aPTT IS ELEVATED,     SUGGEST PATIENT RISK ASSESSMENT     BE USED TO DETERMINE APPROPRIATE     ANTICOAGULANT THERAPY.  GLUCOSE, CAPILLARY     Status: Abnormal   Collection Time    11/20/13  7:22 AM      Result Value Range    Glucose-Capillary 125 (*) 70 - 99 mg/dL    Dg Abd Acute W/chest  11/19/2013   CLINICAL DATA:  Abdominal pain  EXAM: ACUTE ABDOMEN SERIES (ABDOMEN 2 VIEW & CHEST 1 VIEW)  COMPARISON:  11/08/2013  FINDINGS: Cardiac shadow is stable.  The lungs are clear bilaterally.  The abdomen shows a nonobstructive bowel gas pattern. Postsurgical changes are noted in the lower lumbar spine. No obstructive changes are seen. No abnormal mass or abnormal calcifications are noted. No free air is seen.  IMPRESSION: No acute abnormality in the chest and abdomen.   Electronically Signed   By: Inez Catalina M.D.   On: 11/19/2013 07:06    Review of Systems  Constitutional: Negative.   HENT: Negative.   Eyes: Negative.   Respiratory: Negative.   Cardiovascular: Negative.   Gastrointestinal: Positive for constipation.  Genitourinary: Negative.   Musculoskeletal: Positive for joint pain.  Skin: Negative.   Neurological: Negative.   Endo/Heme/Allergies: Negative.   Psychiatric/Behavioral: Negative.    Blood pressure 120/61, pulse 71, temperature 99.4 F (37.4 C), temperature source Oral, resp. rate 18, height 6' 3"  (1.905 m), weight 285 lb (129.275 kg), SpO2 99.00%. Physical Exam  Nursing note and vitals reviewed. Constitutional: He is oriented to person, place, and time. He appears well-developed and well-nourished.  HENT:  Head: Normocephalic.  Neck: No tracheal deviation present.  Cardiovascular: Normal rate.   Respiratory: No stridor.  GI: There is no tenderness.  Musculoskeletal:       Right knee: He exhibits decreased range of motion and swelling. He exhibits no effusion, no ecchymosis, no deformity, no laceration, no erythema, normal alignment and no bony tenderness. No tenderness found.       Legs: Neurological: He is alert and oriented to person, place, and time.  Skin: Skin is warm and dry. No rash noted. No erythema. No pallor.  Psychiatric: He has a normal mood and affect. His behavior is normal.  Judgment and thought content normal.    Assessment/Plan:  1. Post op right tka 2. Constipation 3. Anemia   Stop antibiotics  Work on constipation Start therapy   Arther Abbott 11/20/2013, 9:33 AM

## 2013-11-21 DIAGNOSIS — Z96659 Presence of unspecified artificial knee joint: Secondary | ICD-10-CM

## 2013-11-21 LAB — CBC
Hemoglobin: 7.4 g/dL — ABNORMAL LOW (ref 13.0–17.0)
MCH: 30.8 pg (ref 26.0–34.0)
MCHC: 32.9 g/dL (ref 30.0–36.0)
MCV: 93.8 fL (ref 78.0–100.0)
Platelets: 212 10*3/uL (ref 150–400)
RBC: 2.4 MIL/uL — ABNORMAL LOW (ref 4.22–5.81)
WBC: 6.4 10*3/uL (ref 4.0–10.5)

## 2013-11-21 LAB — GLUCOSE, CAPILLARY
Glucose-Capillary: 124 mg/dL — ABNORMAL HIGH (ref 70–99)
Glucose-Capillary: 116 mg/dL — ABNORMAL HIGH (ref 70–99)

## 2013-11-21 MED ORDER — DOCUSATE SODIUM 100 MG PO CAPS: 100.0000 mg | ORAL_CAPSULE | Freq: Two times a day (BID) | ORAL | Status: DC

## 2013-11-21 NOTE — Progress Notes (Signed)
Pt. Refused soap suds enema that was scheduled for 05:00.   Clayborn Bigness 11/21/2013

## 2013-11-21 NOTE — Progress Notes (Signed)
Pt had an episode of green emesis. Gave one dose of zofran. Will continue to monitor.

## 2013-11-21 NOTE — Progress Notes (Signed)
Pt has had multiple bowel movements tonight and all have been small amounts, loose, and bloody.    [name] 11/21/2013

## 2013-11-21 NOTE — Progress Notes (Signed)
[facility] PROGRESS NOTE  [name] HDQ:[id number] DOB: Mar 04, 1946 DOA: 11/19/2013 PCP: [name], MD  Assessment/Plan: 1. Fever with suspected cellulitis/possible post surgical joint infection  1. Pt was initially started on empiric vanc and zosyn 2. Ortho consulted as questionable cellulitis is over post-surgical site for recent knee replacement 3. Appreciate Ortho recs. Recs for holding abx 4. Febrile yesterday. Will check blood cx. Recent UA and cxr unremarkable 2. Anemia  1. Records reviewed 2. Suspected chronic anemia secondary to chronic disease vs renal failure 3. Transfused 2 units, however hgb increased only by 0.8gm 4. Follow cbc, currently stable 5. Stool recently neg for blood 6. Pt is iron deficient and with low b12. Cont iron replacement 3. DM  1. Cont on SSI 4. Hx chronic diastolic chf  1. Remains euvolemic 2. Monitor i/o 5. CKD 3  1. Follow renal fx 2. Stable remains presently 6. HTN  1. Stable 2. Cont meds 7. DVT prophylaxis  1. Given high risk for DVT (hx of recent knee surgery), would cont on heparin subQ 8. Constipation 1. Cont enema as needed 2. Multiple small BM noted overnight 3. Cont on bowel regimen as tolerated  Code Status: Full Family Communication: Pt in room (indicate person spoken with, relationship, and if by phone, the number) Disposition Plan: Pending  Antibiotics:  Vancomycin 11/19/13>>>11/20/13  Zosyn 11/19/13>>>11/20/13  HPI/Subjective: Multiple small BM overnight s/p SMOG enema  Objective: Filed Vitals:   11/20/13 0510 11/20/13 1625 11/20/13 2013 11/21/13 0438  BP: 120/61 156/59 114/58 127/55  Pulse: 71 82 90 70  Temp: 99.4 F (37.4 C) 100.6 F (38.1 C) 99.6 F (37.6 C) 98.1 F (36.7 C)  TempSrc: Oral Oral Oral Oral  Resp: 18 19 18 18   Height:      Weight:      SpO2: 99% 99% 96% 99%    Intake/Output Summary (Last 24 hours) at 11/21/13 1019 Last data filed at 11/21/13 0800  Gross per 24 hour   Intake    710 ml  Output   1700 ml  Net   -990 ml   Filed Weights   11/19/13 0532 11/19/13 0829  Weight: 129.275 kg (285 lb) 129.275 kg (285 lb)    Exam:   General:  Awake, in nad  Cardiovascular: regular, s1, s2  Respiratory: normal resp effort, no wheezing  Abdomen: soft, nondistended  Musculoskeletal: perfused, no clubbing   Data Reviewed: Basic Metabolic Panel:  Recent Labs Lab 11/16/13 0425 11/17/13 0430 11/19/13 0531 11/20/13 0601  NA 140 139 139 138  K 4.3 4.3 4.1 4.1  CL 107 107 104 102  CO2 24 22 22 24   GLUCOSE 149* 145* 136* 130*  BUN 26* 23 25* 25*  CREATININE 1.31 1.33 1.50* 1.49*  CALCIUM 8.5 8.4 9.2 9.1   Liver Function Tests:  Recent Labs Lab 11/20/13 0601  AST 13  ALT 7  ALKPHOS 59  BILITOT 0.8  PROT 6.5  ALBUMIN 2.2*   No results found for this basename: LIPASE, AMYLASE,  in the last 168 hours No results found for this basename: AMMONIA,  in the last 168 hours CBC:  Recent Labs Lab 11/16/13 0425 11/17/13 0430 11/19/13 0531 11/20/13 0025 11/20/13 0601 11/21/13 0609  WBC 7.5 8.8 9.3  --  8.3 6.4  HGB 7.0* 7.3* 6.3* 7.2* 7.1* 7.4*  HCT 22.1* 23.1* 19.9* 22.1* 21.6* 22.5*  MCV 95.3 95.1 94.3  --  93.9 93.8  PLT 194 176 199  --  189 212  Cardiac Enzymes: No results found for this basename: CKTOTAL, CKMB, CKMBINDEX, TROPONINI,  in the last 168 hours BNP (last 3 results) No results found for this basename: PROBNP,  in the last 8760 hours CBG:  Recent Labs Lab 11/20/13 0722 11/20/13 1134 11/20/13 1632 11/20/13 2104 11/21/13 0725  GLUCAP 125* 126* 90 157* 130*    No results found for this or any previous visit (from the past 240 hour(s)).   Studies: Dg Knee 1-2 Views Right  11/20/2013   CLINICAL DATA:  Prior right knee replacement .  EXAM: RIGHT KNEE - 1-2 VIEW  COMPARISON:  Right knee series 8 05/2012.  FINDINGS: Right knee replacement in good anatomic position. There is no evidence of fracture or dislocation. Soft  tissue swelling and subcutaneous air noted consistent with patient's recent surgery. Mild vascular calcification noted.  IMPRESSION: Right knee replacement with good anatomic alignment.   Electronically Signed   By: [place]   On: 11/20/2013 10:41    Scheduled Meds: . allopurinol  300 mg Oral Daily  . amLODipine  10 mg Oral Daily  . aspirin EC  325 mg Oral BID  . docusate sodium  100 mg Oral BID  . ferrous sulfate  325 mg Oral TID PC  . furosemide  40 mg Oral Daily  . gabapentin  300 mg Oral BID  . heparin  5,000 Units Subcutaneous Q8H  . insulin aspart  0-15 Units Subcutaneous TID WC  . insulin aspart  0-5 Units Subcutaneous QHS  . linagliptin  5 mg Oral Q breakfast  . metoprolol  50 mg Oral BID  . piperacillin-tazobactam (ZOSYN)  IV  3.375 g Intravenous Q8H  . polyethylene glycol  17 g Oral BID  . polyethylene glycol  17 g Oral Daily  . simvastatin  20 mg Oral q1800  . sodium chloride  3 mL Intravenous Q12H   Continuous Infusions: . sodium chloride 1,000 mL (11/19/13 0656)  . sodium chloride 75 mL/hr at 11/20/13 1347    Principal Problem:   Cellulitis Active Problems:   Hypertension   Chronic kidney disease, stage 3, cardiorenal syndrome   Diabetes mellitus   S/P right TKA   Anemia  Time spent: 18mn  Autumn Gunn, SSan MarcosHospitalists Pager [phone] If 7PM-7AM, please contact night-coverage at [email or web address], password [email or web address]11/30/2014, 10:19 AM  LOS: 2 days

## 2013-11-21 NOTE — Discharge Summary (Signed)
Physician Discharge Summary  [name] [id number]:[id number] DOB: 1946-03-03 DOA: 11/19/2013  PCP: [name], MD  Admit date: 11/19/2013 Discharge date: 11/23/2013  Time spent: 35 minutes  Recommendations for Outpatient Follow-up:  1. Follow up with PCP in 1 week 2. Would repeat CBC within one week  Discharge Diagnoses:  Principal Problem:   Cellulitis Active Problems:   Hypertension   Chronic kidney disease, stage 3, cardiorenal syndrome   Diabetes mellitus   S/P right TKA   Anemia   Discharge Condition: Improved  Diet recommendation: High fiber  Filed Weights   11/19/13 0532 11/19/13 0829 11/23/13 1039  Weight: 129.275 kg (285 lb) 129.275 kg (285 lb) 129.275 kg (285 lb)    History of present illness:  [name] is a 67 y.o. male With a hx of ckd, chronic chf, DM2, HTN, chronic anemia, and osteoarthritis for which the pt is s/p R knee replacement one week ago. Pt presents from nursing home. Records reviewed. Pt recently noted to have suspicion for developing cellulitis over the pos surgical site and was given empiric keflex. Around this time, pt noted to be febrile and with a hgb below 7. He was referred to the ED for further management  Hospital Course:  1. Fever with suspected cellulitis/possible post surgical joint infection  1. Pt was initially started on empiric vanc and zosyn 2. Ortho consulted as questionable cellulitis is over post-surgical site for recent knee replacement 3. Xray of post-surgical site unremarkable. Appreciate Ortho recs. Recs for holding abx as there does not seem to be an infection. 4. Pt was febrile on 11/20/13 s/p blood transfusion.yesterday. Blood cx are pending. Recent UA and cxr unremarkable. Unclear etiology for fever, however, pt does not leukocytosis and is not clinically ill. Possible low grade temp related to recent blood transfusion. 2. Anemia  1. Records reviewed 2. Suspected chronic anemia secondary to chronic disease vs  renal failure 3. Transfused 2 units. 4. CBC currently stable, in fact slightly increased from yesterday 5. Stool recently neg for blood prior to admit 6. Pt is iron deficient and with low b12. Cont iron replacement with stool softener daily 7. GI was consulted given presenting anemia. Discussed with GI. Colon was not revealing of any significant source of bleed. The patient was otherwise stable for discharge per the GI service 8. Of note, Hgb as of 11/23/13 was noted to up to 8.7 (without additional transfusion) 3. DM  1. Cont on SSI 4. Hx chronic diastolic chf  1. Remains euvolemic 2. Monitor i/o 5. CKD 3  1. Follow renal fx 2. Stable remains presently 6. HTN  1. Stable 2. Cont meds 7. DVT prophylaxis  1. Given high risk for DVT (hx of recent knee surgery), cont on heparin subQ 8. Constipation  1. Cont enema as needed 2. Multiple small BM noted overnight with large BM noted with SMOG enema 3. Cont on bowel regimen as tolerated  Consultations: Orthopedic surgery  Discharge Exam: Filed Vitals:   11/23/13 1215 11/23/13 1220 11/23/13 1225 11/23/13 1304  BP: 144/70 139/69 137/59 165/66  Pulse: 45   70  Temp:    99.2 F (37.3 C)  TempSrc:    Oral  Resp: 19 21 19 20   Height:      Weight:      SpO2: 84%   93%    General: Awake, in nad Cardiovascular: regular, s1, s2 Respiratory: normal resp effort, no wheezing  Discharge Instructions       Future Appointments Provider  Department Dept Phone   03/09/2014 11:30 AM Manon Hilding [facility] [phone]       Medication List         acetaminophen 325 MG tablet  Commonly known as:  TYLENOL  Take 1-2 tablets (325-650 mg total) by mouth every 6 (six) hours as needed.     allopurinol 300 MG tablet  Commonly known as:  ZYLOPRIM  Take 300 mg by mouth daily.     ALPRAZolam 0.5 MG tablet  Commonly known as:  XANAX  Take 0.5 mg by mouth 2 (two) times daily as needed for anxiety.     amLODipine 10  MG tablet  Commonly known as:  NORVASC  Take 10 mg by mouth daily.     DSS 100 MG Caps  Take 100 mg by mouth 2 (two) times daily.     ferrous sulfate 325 (65 FE) MG tablet  Take 1 tablet (325 mg total) by mouth 3 (three) times daily after meals.     furosemide 40 MG tablet  Commonly known as:  LASIX  Take 40 mg by mouth daily.     gabapentin 300 MG capsule  Commonly known as:  NEURONTIN  Take 300 mg by mouth 2 (two) times daily.     metoprolol 50 MG tablet  Commonly known as:  LOPRESSOR  Take 50 mg by mouth 2 (two) times daily.     NITROSTAT 0.4 MG SL tablet  Generic drug:  nitroGLYCERIN  Place 0.4 mg under the tongue every 5 (five) minutes as needed for chest pain.     ONGLYZA 5 MG Tabs tablet  Generic drug:  saxagliptin HCl  Take 5 mg by mouth daily.     oxyCODONE 5 MG immediate release tablet  Commonly known as:  Oxy IR/ROXICODONE  Take 1-3 tablets (5-15 mg total) by mouth every 4 (four) hours as needed for severe pain.     pantoprazole 40 MG tablet  Commonly known as:  PROTONIX  Take 1 tablet (40 mg total) by mouth daily before breakfast.  Start taking on:  11/24/2013     polyethylene glycol packet  Commonly known as:  MIRALAX / GLYCOLAX  Take 17 g by mouth 2 (two) times daily.     pravastatin 20 MG tablet  Commonly known as:  PRAVACHOL  Take 20 mg by mouth at bedtime.       Allergies  Allergen Reactions  . Niacin And Related Other (See Comments)    Reaction: flushing and burning side effects even with extended release   Follow-up Information   Follow up with [name], MD In 1 week.   Specialty:  Family Medicine   Contact information:   [address] [place] [address] [phone]        The results of significant diagnostics from this hospitalization (including imaging, microbiology, ancillary and laboratory) are listed below for reference.    Significant Diagnostic Studies: Dg Chest 2 View  11/08/2013    CLINICAL DATA:  Preoperative evaluation for right total knee arthroplasty, history hypertension, diabetes, former smoker, diastolic heart failure  EXAM: CHEST  2 VIEW  COMPARISON:  11/14/2012  FINDINGS: Normal heart size and pulmonary vascularity.  Mildly tortuous thoracic aorta.  Lungs clear.  No pleural effusion or pneumothorax.  Prior lumbar fusion.  Scattered endplate spur formation thoracic spine.  IMPRESSION: No acute abnormalities.   Electronically Signed   By: Lavonia Dana M.D.   On: 11/08/2013  16:32   Dg Knee 1-2 Views Right  11/20/2013   CLINICAL DATA:  Prior right knee replacement .  EXAM: RIGHT KNEE - 1-2 VIEW  COMPARISON:  Right knee series 8 05/2012.  FINDINGS: Right knee replacement in good anatomic position. There is no evidence of fracture or dislocation. Soft tissue swelling and subcutaneous air noted consistent with patient's recent surgery. Mild vascular calcification noted.  IMPRESSION: Right knee replacement with good anatomic alignment.   Electronically Signed   By: Marcello Moores  Register   On: 11/20/2013 10:41   Dg Abd Acute W/chest  11/19/2013   CLINICAL DATA:  Abdominal pain  EXAM: ACUTE ABDOMEN SERIES (ABDOMEN 2 VIEW & CHEST 1 VIEW)  COMPARISON:  11/08/2013  FINDINGS: Cardiac shadow is stable.  The lungs are clear bilaterally.  The abdomen shows a nonobstructive bowel gas pattern. Postsurgical changes are noted in the lower lumbar spine. No obstructive changes are seen. No abnormal mass or abnormal calcifications are noted. No free air is seen.  IMPRESSION: No acute abnormality in the chest and abdomen.   Electronically Signed   By: Inez Catalina M.D.   On: 11/19/2013 07:06    Microbiology: Recent Results (from the past 240 hour(s))  CULTURE, BLOOD (ROUTINE X 2)     Status: None   Collection Time    11/21/13 10:37 AM      Result Value Range Status   Specimen Description BLOOD RIGHT HAND   Final   Special Requests BOTTLES DRAWN AEROBIC ONLY 6CC   Final   Culture NO GROWTH 2 DAYS    Final   Report Status PENDING   Incomplete  CULTURE, BLOOD (ROUTINE X 2)     Status: None   Collection Time    11/21/13 10:37 AM      Result Value Range Status   Specimen Description LEFT ANTECUBITAL   Final   Special Requests BOTTLES DRAWN AEROBIC AND ANAEROBIC [place]   Final   Culture NO GROWTH 2 DAYS   Final   Report Status PENDING   Incomplete     Labs: Basic Metabolic Panel:  Recent Labs Lab 11/17/13 0430 11/19/13 0531 11/20/13 0601 11/22/13 0527  NA 139 139 138 141  K 4.3 4.1 4.1 4.1  CL 107 104 102 106  CO2 22 22 24 24   GLUCOSE 145* 136* 130* 133*  BUN 23 25* 25* 25*  CREATININE 1.33 1.50* 1.49* 1.46*  CALCIUM 8.4 9.2 9.1 9.1   Liver Function Tests:  Recent Labs Lab 11/20/13 0601  AST 13  ALT 7  ALKPHOS 59  BILITOT 0.8  PROT 6.5  ALBUMIN 2.2*   No results found for this basename: LIPASE, AMYLASE,  in the last 168 hours No results found for this basename: AMMONIA,  in the last 168 hours CBC:  Recent Labs Lab 11/19/13 0531 11/20/13 0025 11/20/13 0601 11/21/13 0609 11/22/13 0527 11/23/13 0724  WBC 9.3  --  8.3 6.4 7.6 10.1  HGB 6.3* 7.2* 7.1* 7.4* 7.1* 8.7*  HCT 19.9* 22.1* 21.6* 22.5* 22.1* 26.8*  MCV 94.3  --  93.9 93.8 94.8 94.4  PLT 199  --  189 212 249 293   Cardiac Enzymes: No results found for this basename: CKTOTAL, CKMB, CKMBINDEX, TROPONINI,  in the last 168 hours BNP: BNP (last 3 results) No results found for this basename: PROBNP,  in the last 8760 hours CBG:  Recent Labs Lab 11/22/13 0715 11/22/13 1116 11/22/13 1631 11/22/13 2056 11/23/13 0800  GLUCAP 146* 129* 105* 139*  133*   Signed:  Ibrahim Mcpheeters K  [facility] 11/23/2013, 3:29 PM

## 2013-11-22 ENCOUNTER — Encounter (HOSPITAL_COMMUNITY): Payer: Self-pay | Admitting: Gastroenterology

## 2013-11-22 DIAGNOSIS — K625 Hemorrhage of anus and rectum: Secondary | ICD-10-CM

## 2013-11-22 DIAGNOSIS — D649 Anemia, unspecified: Secondary | ICD-10-CM

## 2013-11-22 LAB — FERRITIN: Ferritin: 809 ng/mL — ABNORMAL HIGH (ref 22–322)

## 2013-11-22 LAB — GLUCOSE, CAPILLARY
Glucose-Capillary: 146 mg/dL — ABNORMAL HIGH (ref 70–99)
Glucose-Capillary: 129 mg/dL — ABNORMAL HIGH (ref 70–99)
Glucose-Capillary: 139 mg/dL — ABNORMAL HIGH (ref 70–99)

## 2013-11-22 LAB — BASIC METABOLIC PANEL
Chloride: 106 mEq/L (ref 96–112)
Creatinine, Ser: 1.46 mg/dL — ABNORMAL HIGH (ref 0.50–1.35)
GFR calc Af Amer: 55 mL/min — ABNORMAL LOW (ref >90)
GFR calc non Af Amer: 48 mL/min — ABNORMAL LOW (ref >90)
Glucose, Bld: 133 mg/dL — ABNORMAL HIGH (ref 70–99)
Potassium: 4.1 mEq/L (ref 3.5–5.1)
Sodium: 141 mEq/L (ref 135–145)

## 2013-11-22 LAB — CBC
Hemoglobin: 7.1 g/dL — ABNORMAL LOW (ref 13.0–17.0)
MCHC: 32.1 g/dL (ref 30.0–36.0)
MCV: 94.8 fL (ref 78.0–100.0)
Platelets: 249 10*3/uL (ref 150–400)
RBC: 2.33 MIL/uL — ABNORMAL LOW (ref 4.22–5.81)

## 2013-11-22 MED ORDER — PEG 3350-KCL-NA BICARB-NACL 420 G PO SOLR
4000.0000 mL | Freq: Once | ORAL | Status: AC
  Administered 2013-11-22: 4000 mL via ORAL
  Filled 2013-11-22: qty 4000

## 2013-11-22 MED ORDER — SODIUM CHLORIDE 0.9 % IV SOLN: INTRAVENOUS | Status: DC

## 2013-11-22 NOTE — Progress Notes (Signed)
[facility] PROGRESS NOTE  [name] SHELNUTT YEB:[id number] DOB: 07/31/1946 DOA: 11/19/2013 PCP: [name], MD  Assessment/Plan: 1. Fever with suspected cellulitis/possible post surgical joint infection  1. Pt was initially started on empiric vanc and zosyn 2. Ortho consulted as questionable cellulitis is over post-surgical site for recent knee replacement 3. Appreciate Ortho recs. Recs for holding abx 4. Afebrile over 24hrs. Blood cx pending. Recent UA and cxr unremarkable 2. Anemia  1. Records reviewed 2. Suspected chronic anemia secondary to chronic disease vs renal failure 3. Transfused 2 units, however hgb increased only by 0.8gm 4. Follow cbc, currently stable 5. Stool recently neg for blood 6. Pt is iron deficient and with low b12. Cont iron replacement 7. Consider GI consult to r/o active bleed 3. DM  1. Cont on SSI 4. Hx chronic diastolic chf  1. Remains euvolemic 2. Monitor i/o 5. CKD 3  1. Follow renal fx 2. Stable remains presently 6. HTN  1. Stable 2. Cont meds 7. DVT prophylaxis  1. Given high risk for DVT (hx of recent knee surgery), would cont on heparin subQ 8. Constipation 1. Cont enema as needed 2. Multiple small BM noted overnight 3. Cont on bowel regimen as tolerated  Code Status: Full Family Communication: Pt in room (indicate person spoken with, relationship, and if by phone, the number) Disposition Plan: Pending  Antibiotics:  Vancomycin 11/19/13>>>11/20/13  Zosyn 11/19/13>>>11/20/13  HPI/Subjective: No acute events. Pt eager to return to rehab  Objective: Filed Vitals:   11/21/13 1044 11/21/13 1400 11/21/13 2128 11/22/13 0528  BP: 146/92 132/64 127/53 127/53  Pulse:  84 85 69  Temp:  99.1 F (37.3 C) 98.2 F (36.8 C) 98.6 F (37 C)  TempSrc:  Oral Oral Oral  Resp:  18 19 18   Height:      Weight:      SpO2:  100% 95% 96%    Intake/Output Summary (Last 24 hours) at 11/22/13 0919 Last data filed at 11/22/13 8372  Gross per 24 hour  Intake    243 ml  Output   1400 ml  Net  -1157 ml   Filed Weights   11/19/13 0532 11/19/13 0829  Weight: 129.275 kg (285 lb) 129.275 kg (285 lb)    Exam:   General:  Awake, in nad  Cardiovascular: regular, s1, s2  Respiratory: normal resp effort, no wheezing  Abdomen: soft, nondistended  Musculoskeletal: perfused, no clubbing   Data Reviewed: Basic Metabolic Panel:  Recent Labs Lab 11/16/13 0425 11/17/13 0430 11/19/13 0531 11/20/13 0601 11/22/13 0527  NA 140 139 139 138 141  K 4.3 4.3 4.1 4.1 4.1  CL 107 107 104 102 106  CO2 24 22 22 24 24   GLUCOSE 149* 145* 136* 130* 133*  BUN 26* 23 25* 25* 25*  CREATININE 1.31 1.33 1.50* 1.49* 1.46*  CALCIUM 8.5 8.4 9.2 9.1 9.1   Liver Function Tests:  Recent Labs Lab 11/20/13 0601  AST 13  ALT 7  ALKPHOS 59  BILITOT 0.8  PROT 6.5  ALBUMIN 2.2*   No results found for this basename: LIPASE, AMYLASE,  in the last 168 hours No results found for this basename: AMMONIA,  in the last 168 hours CBC:  Recent Labs Lab 11/17/13 0430 11/19/13 0531 11/20/13 0025 11/20/13 0601 11/21/13 0609 11/22/13 0527  WBC 8.8 9.3  --  8.3 6.4 7.6  HGB 7.3* 6.3* 7.2* 7.1* 7.4* 7.1*  HCT 23.1* 19.9* 22.1* 21.6* 22.5* 22.1*  MCV 95.1 94.3  --  93.9 93.8 94.8  PLT 176 199  --  189 212 249   Cardiac Enzymes: No results found for this basename: CKTOTAL, CKMB, CKMBINDEX, TROPONINI,  in the last 168 hours BNP (last 3 results) No results found for this basename: PROBNP,  in the last 8760 hours CBG:  Recent Labs Lab 11/21/13 0725 11/21/13 1158 11/21/13 1658 11/21/13 2136 11/22/13 0715  GLUCAP 130* 124* 116* 166* 146*    No results found for this or any previous visit (from the past 240 hour(s)).   Studies: Dg Knee 1-2 Views Right  11/20/2013   CLINICAL DATA:  Prior right knee replacement .  EXAM: RIGHT KNEE - 1-2 VIEW  COMPARISON:  Right knee series 8 05/2012.  FINDINGS: Right knee replacement in good  anatomic position. There is no evidence of fracture or dislocation. Soft tissue swelling and subcutaneous air noted consistent with patient's recent surgery. Mild vascular calcification noted.  IMPRESSION: Right knee replacement with good anatomic alignment.   Electronically Signed   By: [place]   On: 11/20/2013 10:41    Scheduled Meds: . allopurinol  300 mg Oral Daily  . amLODipine  10 mg Oral Daily  . aspirin EC  325 mg Oral BID  . docusate sodium  100 mg Oral BID  . ferrous sulfate  325 mg Oral TID PC  . furosemide  40 mg Oral Daily  . gabapentin  300 mg Oral BID  . heparin  5,000 Units Subcutaneous Q8H  . insulin aspart  0-15 Units Subcutaneous TID WC  . insulin aspart  0-5 Units Subcutaneous QHS  . linagliptin  5 mg Oral Q breakfast  . metoprolol  50 mg Oral BID  . polyethylene glycol  17 g Oral BID  . polyethylene glycol  17 g Oral Daily  . simvastatin  20 mg Oral q1800  . sodium chloride  3 mL Intravenous Q12H   Continuous Infusions: . sodium chloride 1,000 mL (11/19/13 0656)  . sodium chloride 75 mL/hr at 11/22/13 0134    Principal Problem:   Cellulitis Active Problems:   Hypertension   Chronic kidney disease, stage 3, cardiorenal syndrome   Diabetes mellitus   S/P right TKA   Anemia  Time spent: 69mn  Lukis Bunt, [facility] Pager [phone] If 7PM-7AM, please contact night-coverage at [email or web address], password [email or web address]12/12/2012, 9:19 AM  LOS: 3 days

## 2013-11-22 NOTE — Care Management Note (Addendum)
    Page 1 of 1   11/23/2013     3:37:42 PM   CARE MANAGEMENT NOTE 11/23/2013  Patient:  [name], [name]   Account Number:  [id number]  Date Initiated:  11/22/2013  Documentation initiated by:  Theophilus Kinds  Subjective/Objective Assessment:   Pt admitted from [facility] with anemia. Pt will return to facility when medically stable.     Action/Plan:   CSW to arrange discharge to facility when medically stable.   Anticipated DC Date:  11/23/2013   Anticipated DC Plan:  SKILLED NURSING FACILITY  In-house referral  Clinical Social Worker      DC Planning Services  CM consult      Choice offered to / List presented to:             Status of service:  Completed, signed off Medicare Important Message given?  YES (If response is "NO", the following Medicare IM given date fields will be blank) Date Medicare IM given:  11/23/2013 Date Additional Medicare IM given:    Discharge Disposition:  [place]  Per UR Regulation:    If discussed at Long Length of Stay Meetings, dates discussed:    Comments:  11/23/13 [place], RN BSN CM Pt discharged back to [facility] today. CSW to arrange discharge to facility.  11/22/13 [place], RN BSN CM

## 2013-11-22 NOTE — Consult Note (Addendum)
Referring Provider: Dr. Marylu Lund Primary Care Physician:  Dr. [name] Primary Gastroenterologist:  Dr. Gala Romney   Date of Admission: 11/19/13 Date of Consultation: 11/22/13  Reason for Consultation:  Anemia  HPI:  [name] is a pleasant 67 year old male admitted secondary to concern for cellulitis, fever, possible surgical infection. Dr. Aline Brochure has evaluated patient and recommended stopping abx and pursuing physical therapy. Patient has multiple medical issues to include chronic kidney disease and chronic anemia. He was found to have a Hgb of 6.3 on admission. He received 2 units PRBCs with only slight improvement to 7.4. It appears he was heme negative. Iron studies performed with low iron and TIBC. Ferritin not on file.    Denies any melena, hematochezia. Last colonoscopy in remote past, unable to remember where or by whom. Denies abdominal pain, nausea or vomiting. Chronic constipation. No reflux or other upper GI symptoms. He would like to return to the [facility].    Past Medical History  Diagnosis Date  . Diastolic heart failure     Pedal edema, "a long time ago"  . Hypertension     Exercise induced  . NSVT (nonsustained ventricular tachycardia)     Exercise induced  . 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 Obesity   . Degenerative joint disease   . Benign prostatic hypertrophy     Nocturia  . Erectile dysfunction   . High cholesterol   . CAD (coronary artery disease)     a.  NSTEMI 10/2012 s/p DES to LAD & DES to 1st diagonal with residual diffuse nonobstructive dz in LCx/RCA   . Chronic kidney disease, stage 3, mod decreased GFR     Creatinine of 1.5 in 03/2009  . Diabetes mellitus, type II 12/10/2012.  Marland Kitchen Anxiety   . Depression     hx of  . Flu 2013    hx of    Past Surgical History  Procedure Laterality Date  . Cardiac catheterization      with stent placement  . Back surgery  2007    x2  . Hernia repair    . Total knee  arthroplasty Right 11/15/2013    Procedure: RIGHT TOTAL KNEE ARTHROPLASTY;  Surgeon: Mauri Pole, MD;  Location: [facility];  Service: Orthopedics;  Laterality: Right;    Prior to Admission medications   Medication Sig Start Date End Date Taking? Authorizing Provider  acetaminophen (TYLENOL) 325 MG tablet Take 1-2 tablets (325-650 mg total) by mouth every 6 (six) hours as needed. 11/17/13  Yes Lucille Passy Babish, PA-C  allopurinol (ZYLOPRIM) 300 MG tablet Take 300 mg by mouth daily.   Yes Historical Provider, MD  ALPRAZolam Duanne Moron) 0.5 MG tablet Take 0.5 mg by mouth 2 (two) times daily as needed for anxiety.   Yes Historical Provider, MD  amLODipine (NORVASC) 10 MG tablet Take 10 mg by mouth daily.   Yes Historical Provider, MD  docusate sodium 100 MG CAPS Take 100 mg by mouth 2 (two) times daily. 11/17/13  Yes Lucille Passy Babish, PA-C  ferrous sulfate 325 (65 FE) MG tablet Take 1 tablet (325 mg total) by mouth 3 (three) times daily after meals. 11/17/13  Yes Lucille Passy Babish, PA-C  furosemide (LASIX) 40 MG tablet Take 40 mg by mouth daily.  02/08/13  Yes Yehuda Savannah, MD  gabapentin (NEURONTIN) 300 MG capsule Take 300 mg by mouth 2 (two) times daily.    Yes Historical Provider,  MD  metoprolol (LOPRESSOR) 50 MG tablet Take 50 mg by mouth 2 (two) times daily.   Yes Historical Provider, MD  NITROSTAT 0.4 MG SL tablet Place 0.4 mg under the tongue every 5 (five) minutes as needed for chest pain.  06/12/13  Yes Historical Provider, MD  ONGLYZA 5 MG TABS tablet Take 5 mg by mouth daily.  02/09/13  Yes Historical Provider, MD  oxyCODONE (OXY IR/ROXICODONE) 5 MG immediate release tablet Take 1-3 tablets (5-15 mg total) by mouth every 4 (four) hours as needed for severe pain. 11/17/13  Yes Lucille Passy Babish, PA-C  polyethylene glycol (MIRALAX / GLYCOLAX) packet Take 17 g by mouth 2 (two) times daily. 11/17/13  Yes Lucille Passy Babish, PA-C  pravastatin (PRAVACHOL) 20 MG tablet Take 20 mg by  mouth at bedtime.    Yes Historical Provider, MD  docusate sodium (COLACE) 100 MG capsule Take 1 capsule (100 mg total) by mouth 2 (two) times daily. 11/21/13   Donne Hazel, MD    Current Facility-Administered Medications  Medication Dose Route Frequency Provider Last Rate Last Dose  . 0.9 %  sodium chloride infusion   Intravenous Continuous Teressa Lower, MD 125 mL/hr at 11/19/13 0656 1,000 mL at 11/19/13 0656  . 0.9 %  sodium chloride infusion   Intravenous Continuous Donne Hazel, MD 75 mL/hr at 11/22/13 0134    . acetaminophen (TYLENOL) tablet 650 mg  650 mg Oral Q6H PRN Donne Hazel, MD   650 mg at 11/20/13 1734   Or  . acetaminophen (TYLENOL) suppository 650 mg  650 mg Rectal Q6H PRN Donne Hazel, MD      . allopurinol (ZYLOPRIM) tablet 300 mg  300 mg Oral Daily Donne Hazel, MD   300 mg at 11/22/13 3536  . ALPRAZolam Duanne Moron) tablet 0.5 mg  0.5 mg Oral BID PRN Donne Hazel, MD   0.5 mg at 11/21/13 1254  . amLODipine (NORVASC) tablet 10 mg  10 mg Oral Daily Donne Hazel, MD   10 mg at 11/22/13 0820  . aspirin EC tablet 325 mg  325 mg Oral BID Donne Hazel, MD   325 mg at 11/22/13 0820  . chlorproMAZINE (THORAZINE) tablet 25 mg  25 mg Oral TID PRN Donne Hazel, MD   25 mg at 11/21/13 1451  . docusate sodium (COLACE) capsule 100 mg  100 mg Oral BID Donne Hazel, MD   100 mg at 11/22/13 1443  . ferrous sulfate tablet 325 mg  325 mg Oral TID PC Donne Hazel, MD   325 mg at 11/22/13 1540  . furosemide (LASIX) tablet 40 mg  40 mg Oral Daily Donne Hazel, MD   40 mg at 11/22/13 0867  . gabapentin (NEURONTIN) capsule 300 mg  300 mg Oral BID Donne Hazel, MD   300 mg at 11/22/13 6195  . heparin injection 5,000 Units  5,000 Units Subcutaneous Q8H Donne Hazel, MD   5,000 Units at 11/22/13 [phone]  . insulin aspart (novoLOG) injection 0-15 Units  0-15 Units Subcutaneous TID WC Donne Hazel, MD   2 Units at 11/22/13 [phone]  . insulin aspart (novoLOG) injection 0-5 Units  0-5  Units Subcutaneous QHS Donne Hazel, MD      . linagliptin (TRADJENTA) tablet 5 mg  5 mg Oral Q breakfast Donne Hazel, MD   5 mg at 11/22/13 4580  . metoprolol (LOPRESSOR) tablet 50 mg  50 mg Oral BID  Donne Hazel, MD   50 mg at 11/22/13 5003  . morphine 2 MG/ML injection 2 mg  2 mg Intravenous Q4H PRN Donne Hazel, MD   2 mg at 11/21/13 1655  . nitroGLYCERIN (NITROSTAT) SL tablet 0.4 mg  0.4 mg Sublingual Q5 min PRN Donne Hazel, MD      . ondansetron [facility]) tablet 4 mg  4 mg Oral Q6H PRN Donne Hazel, MD       Or  . ondansetron [facility]) injection 4 mg  4 mg Intravenous Q6H PRN Donne Hazel, MD   4 mg at 11/21/13 1656  . oxyCODONE (Oxy IR/ROXICODONE) immediate release tablet 5-15 mg  5-15 mg Oral Q4H PRN Donne Hazel, MD   10 mg at 11/22/13 1047  . polyethylene glycol (MIRALAX / GLYCOLAX) packet 17 g  17 g Oral BID Donne Hazel, MD   17 g at 11/22/13 0820  . polyethylene glycol (MIRALAX / GLYCOLAX) packet 17 g  17 g Oral Daily Carole Civil, MD   17 g at 11/21/13 7048  . simvastatin (ZOCOR) tablet 20 mg  20 mg Oral q1800 Donne Hazel, MD   20 mg at 11/21/13 1704  . sodium chloride 0.9 % injection 3 mL  3 mL Intravenous Q12H Donne Hazel, MD   3 mL at 11/22/13 8891    Allergies as of 11/19/2013 - Review Complete 11/19/2013  Allergen Reaction Noted  . Niacin and related Other (See Comments) 10/24/2012    Family History  Problem Relation Age of Onset  . Hypertension Other   . Cancer Other   . Heart disease    . Diabetes    . Colon cancer Neg Hx     History   Social History  . Marital Status: Widowed    Spouse Name: N/A    Number of Children: N/A  . Years of Education: N/A   Occupational History  . Disabled    Social History Main Topics  . Smoking status: Former Smoker -- 1.00 packs/day for 30 years    Types: Cigarettes    Quit date: 12/23/1992  . Smokeless tobacco: Never Used  . Alcohol Use: No  . Drug Use: No  . Sexual Activity: Not on file    Other Topics Concern  . Not on file   Social History Narrative   Married    Review of Systems: As mentioned in HPI.   Physical Exam: Vital signs in last 24 hours: Temp:  [98.2 F (36.8 C)-99.1 F (37.3 C)] 98.6 F (37 C) (12/01 0528) Pulse Rate:  [69-85] 69 (12/01 0528) Resp:  [18-19] 18 (12/01 0528) BP: (127-132)/(53-64) 127/53 mmHg (12/01 0528) SpO2:  [95 %-100 %] 96 % (12/01 0528) Last BM Date: 11/21/13 General:   Alert,  Well-developed, well-nourished, pleasant and cooperative in NAD Head:  Normocephalic and atraumatic. Eyes:  Sclera clear, no icterus.    Ears:  Normal auditory acuity. Nose:  No deformity, discharge,  or lesions. Mouth:  No deformity or lesions, dentition normal. Neck:  Supple; no masses or thyromegaly. Lungs:  Clear throughout to auscultation.   No wheezes, crackles, or rhonchi. No acute distress. Heart:  S1 S2 present. Abdomen:  Soft, obese, nontender and nondistended. No masses, hepatosplenomegaly or hernias noted.   Rectal:  Deferred until time of colonoscopy.   Msk:  Left hand contractured and without thumb. Extremities:  Without clubbing or edema. Neurologic:  Alert and  oriented x4;  grossly normal neurologically. Skin:  Intact without significant lesions or rashes. Cervical Nodes:  No significant cervical adenopathy. Psych:  Alert and cooperative. Normal mood and affect.  Intake/Output from previous day: 11/30 0701 - 12/01 0700 In: 480 [P.O.:480] Out: 1400 [Urine:1400] Intake/Output this shift: Total I/O In: 243 [P.O.:240; I.V.:3] Out: 350 [Urine:350]  Lab Results:  Recent Labs  11/20/13 0601 11/21/13 0609 11/22/13 0527  WBC 8.3 6.4 7.6  HGB 7.1* 7.4* 7.1*  HCT 21.6* 22.5* 22.1*  PLT 189 212 249   BMET  Recent Labs  11/20/13 0601 11/22/13 0527  NA 138 141  K 4.1 4.1  CL 102 106  CO2 24 24  GLUCOSE 130* 133*  BUN 25* 25*  CREATININE 1.49* 1.46*  CALCIUM 9.1 9.1   LFT  Recent Labs  11/20/13 0601  PROT 6.5   ALBUMIN 2.2*  AST 13  ALT 7  ALKPHOS 59  BILITOT 0.8   PT/INR  Recent Labs  11/20/13 0601  LABPROT 16.2*  INR 1.33    Impression: 67 year old male admitted with possible cellulitis, now clinically improved but found to also have acute on chronic anemia with a Hgb in the 6 range on admission. After 2 units of PRBCs, he is holding steady in the 7 range.  Heme negative on file, without signs of overt GI bleeding such as melena, hematochezia. His only GI complaint is of chronic constipation. It must be noted that his Hgb has trended in the 10/11 range historically, and he likely has an element of iron deficiency anemia in conjunction with chronic disease.   He would benefit from a colonoscopy and upper endoscopy to rule out occult GI source for anemia, but this could be done on an outpatient basis as he has no overt signs of GI bleeding currently. If he is to remain in the hospital another 24-48 hours, these procedures could be performed as inpatient.   Plan: Add ferritin to labs Hemoccult all stools Consider TCS with possible EGD on 12/2  Would need to hold the morning dose of Heparin on 12/2 if procedures performed Consider adding Linzess daily for constipation  Orvil Feil, ANP-BC [facility] Gastroenterology     LOS: 3 days    11/22/2013, 11:15 AM  Addendum at 1250: I spoke with Dr. Wyline Copas to clarify status of hemoccults. Heme negative in epic; however, Dr. Wyline Copas reports grossly bloody stool after enema recently. Hemoccult status not rechecked due to evidence of overt hematochezia. Remain on clear liquid diet for now. Anticipate procedures on 12/2. Will discuss further with Dr. Gala Romney.   Addendum 12/1 at 1330: Golytely this afternoon, tap water enema X 2 in the morning, TCS+/-EGD with Dr. Oneida Alar on 12/2 in Dr. Roseanne Kaufman absence.  Orvil Feil, ANP-BC [facility] Gastroenterology   Attending note: Pt seen and examined this evening; chart reviewed; agree with above assessment and  plan.

## 2013-11-22 NOTE — Evaluation (Signed)
Physical Therapy Evaluation Patient Details Name: [name] MRN: [id number] DOB: 02/10/1946 Today's Date: 11/22/2013 Time: 4098-1191 PT Time Calculation (min): 62 min  PT Assessment / Plan / Recommendation History of Present Illness  Pt is admitted for possible cellulitis of the right knee.  He underwent elective right TKR  about 1 week ago, discharged to [facility] and was only there for one day before coming here.  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  Clinical Impression   Pt was seen for initial eval/tx.  He was anxious to resume PT stating that he had not been allowed OOB and could not get better unless he was able to move.  We initiated therapeutic exercise per protocol and had 10-80 degrees AA ROM of the right knee.  He is able to hold the right knee in extension isometrically.  He required max assist with transfers and needed min assist to ambulate 85' with a walker.  His RLE is still edemetous but there was no visible erythema or warmth.  Cold packs were applied to the knee for pain control.  He will definitely need to resume SNF at d/c.    PT Assessment  Patient needs continued PT services    Follow Up Recommendations  SNF    Does the patient have the potential to tolerate intense rehabilitation      Barriers to Discharge Decreased caregiver support      Equipment Recommendations  None recommended by PT    Recommendations for Other Services     Frequency Min 6X/week    Precautions / Restrictions Precautions Precautions: Fall;Knee Restrictions Weight Bearing Restrictions: No   Pertinent Vitals/Pain       Mobility  Bed Mobility Supine to Sit: 2: Max assist;HOB elevated Supine to Sit: Patient Percentage: 60% Transfers Sit to Stand: 2: Max assist;From elevated surface;With upper extremity assist;From bed Sit to Stand: Patient Percentage: 60% Stand to Sit: To chair/3-in-1;2: Max assist Stand to Sit:  Patient Percentage: 60% Ambulation/Gait Ambulation/Gait Assistance: 4: Min assist Ambulation Distance (Feet): 45 Feet Assistive device: Rolling walker Gait Pattern: Step-through pattern;Trunk flexed;Decreased hip/knee flexion - right Gait velocity: slow and labored Stairs: No Wheelchair Mobility Wheelchair Mobility: No    Exercises Total Joint Exercises Ankle Circles/Pumps: AROM;Both;10 reps;Supine Quad Sets: AROM;Both;10 reps;Supine Short Arc Quad: AAROM;Right;10 reps;Supine Heel Slides: AAROM;Right;10 reps   PT Diagnosis: Difficulty walking;Acute pain  PT Problem List: Decreased strength;Decreased range of motion;Decreased activity tolerance;Decreased mobility;Decreased safety awareness;Pain PT Treatment Interventions: Gait training;Functional mobility training;Therapeutic exercise;Patient/family education     PT Goals(Current goals can be found in the care plan section) Acute Rehab PT Goals Patient Stated Goal: return to independence PT Goal Formulation: With patient Time For Goal Achievement: 12/06/13 Potential to Achieve Goals: Good  Visit Information  Last PT Received On: 11/22/13 History of Present Illness: Pt is admitted for possible cellulitis of the right knee.  He underwent elective right TKR  about 1 week ago, discharged to [facility] and was only there for one day before coming here.  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       [place] expects to be discharged to:: Skilled nursing facility    Cognition  Cognition Arousal/Alertness: Awake/alert Behavior During Therapy: [facility] for tasks assessed/performed Overall Cognitive Status: Within Functional Limits for tasks assessed    Extremity/Trunk Assessment Lower Extremity Assessment Lower Extremity Assessment: RLE deficits/detail RLE Deficits /  Details: ROM right knee=10-80 degrees, AA...able to hold knee extension  isometrically   Balance    End of Session PT - End of Session Equipment Utilized During Treatment: Gait belt Activity Tolerance: Patient tolerated treatment well Patient left: in chair;with call bell/phone within reach Nurse Communication: Mobility status  GP     Sable Feil 11/22/2013, 11:43 AM

## 2013-11-22 NOTE — Clinical Social Work Psychosocial (Signed)
Clinical Social Work Department BRIEF PSYCHOSOCIAL ASSESSMENT 11/22/2013  Patient:  [name], [name]     Account Number:  [id number]     Admit date:  11/19/2013  Clinical Social Worker:  Wyatt Haste  Date/Time:  11/22/2013 09:55 AM  Referred by:  CSW  Date Referred:  11/22/2013 Referred for  SNF Placement   Other Referral:   Interview type:  Patient Other interview type:    PSYCHOSOCIAL DATA Living Status:  FACILITY Admitted from facility:  [facility] Level of care:  [place] Primary support name:   Primary support relationship to patient:  S[place] Degree of support available:   limited per pt    CURRENT CONCERNS Current Concerns  Post-Acute Placement   Other Concerns:    SOCIAL WORK ASSESSMENT / PLAN CSW met with pt at bedside. Pt alert and oriented and reports he had been at [facility] for one day prior to coming to ED for anemia and possible cellulitis. Pt went to [facility] from [place] after a knee replacement. Pt lives alone and plans to return to [facility] to complete rehab prior to return home. He has several brothers listed on chart, but reports limited support. Per Marianna Fuss at [facility], pt is okay to return at d/c and no FL2 needed. Pt to have GI consult today.   Assessment/plan status:  Psychosocial Support/Ongoing Assessment of Needs Other assessment/ plan:   Information/referral to community resources:   [facility]    PATIENT'S/FAMILY'S RESPONSE TO PLAN OF CARE: Pt reports positive feelings regarding return to [facility] when medically stable. CSW will continue to follow.       Benay Pike, [place]

## 2013-11-23 ENCOUNTER — Encounter (HOSPITAL_COMMUNITY)
Admission: EM | Disposition: A | Discharge status: Skilled Nursing Facility | Payer: Self-pay | Source: Home / Self Care | Attending: Internal Medicine

## 2013-11-23 ENCOUNTER — Inpatient Hospital Stay
Admission: RE | Admit: 2013-11-23 | Discharge: 2013-12-31 | Disposition: A | Discharge status: Home / Self Care | Payer: Medicare Other | Source: Ambulatory Visit | Attending: Internal Medicine | Admitting: Internal Medicine

## 2013-11-23 ENCOUNTER — Encounter (HOSPITAL_COMMUNITY): Payer: Self-pay | Admitting: *Deleted

## 2013-11-23 DIAGNOSIS — D126 Benign neoplasm of colon, unspecified: Secondary | ICD-10-CM

## 2013-11-23 DIAGNOSIS — K648 Other hemorrhoids: Secondary | ICD-10-CM

## 2013-11-23 DIAGNOSIS — K625 Hemorrhage of anus and rectum: Secondary | ICD-10-CM

## 2013-11-23 DIAGNOSIS — K2990 Gastroduodenitis, unspecified, without bleeding: Secondary | ICD-10-CM

## 2013-11-23 DIAGNOSIS — D649 Anemia, unspecified: Secondary | ICD-10-CM

## 2013-11-23 DIAGNOSIS — D500 Iron deficiency anemia secondary to blood loss (chronic): Secondary | ICD-10-CM

## 2013-11-23 DIAGNOSIS — K2970 Gastritis, unspecified, without bleeding: Secondary | ICD-10-CM

## 2013-11-23 DIAGNOSIS — K5289 Other specified noninfective gastroenteritis and colitis: Secondary | ICD-10-CM

## 2013-11-23 DIAGNOSIS — E669 Obesity, unspecified: Secondary | ICD-10-CM

## 2013-11-23 HISTORY — PX: COLONOSCOPY WITH ESOPHAGOGASTRODUODENOSCOPY (EGD): SHX5779

## 2013-11-23 LAB — CBC
HCT: 26.8 % — ABNORMAL LOW (ref 39.0–52.0)
Hemoglobin: 8.7 g/dL — ABNORMAL LOW (ref 13.0–17.0)
MCH: 30.6 pg (ref 26.0–34.0)
MCHC: 32.5 g/dL (ref 30.0–36.0)
MCV: 94.4 fL (ref 78.0–100.0)
RBC: 2.84 MIL/uL — ABNORMAL LOW (ref 4.22–5.81)
RDW: 16.4 % — ABNORMAL HIGH (ref 11.5–15.5)

## 2013-11-23 LAB — GLUCOSE, CAPILLARY

## 2013-11-23 SURGERY — COLONOSCOPY WITH ESOPHAGOGASTRODUODENOSCOPY (EGD)
Anesthesia: Moderate Sedation

## 2013-11-23 MED ORDER — MIDAZOLAM HCL 5 MG/5ML IJ SOLN
INTRAMUSCULAR | Status: AC
  Filled 2013-11-23: qty 10

## 2013-11-23 MED ORDER — FENTANYL CITRATE 0.05 MG/ML IJ SOLN
INTRAMUSCULAR | Status: AC
  Filled 2013-11-23: qty 4

## 2013-11-23 MED ORDER — MIDAZOLAM HCL 5 MG/5ML IJ SOLN
INTRAMUSCULAR | Status: DC | PRN
  Administered 2013-11-23 (×3): 1 mg via INTRAVENOUS
  Administered 2013-11-23 (×2): 2 mg via INTRAVENOUS
  Administered 2013-11-23: 1 mg via INTRAVENOUS

## 2013-11-23 MED ORDER — PROMETHAZINE HCL 25 MG/ML IJ SOLN
12.5000 mg | Freq: Once | INTRAMUSCULAR | Status: AC
  Administered 2013-11-23: 12.5 mg via INTRAVENOUS

## 2013-11-23 MED ORDER — PANTOPRAZOLE SODIUM 40 MG PO TBEC: 40.0000 mg | DELAYED_RELEASE_TABLET | Freq: Every day | ORAL | Status: DC

## 2013-11-23 MED ORDER — POLYETHYLENE GLYCOL 3350 17 G PO PACK: 17.0000 g | PACK | Freq: Every day | ORAL | Status: DC

## 2013-11-23 MED ORDER — STERILE WATER FOR IRRIGATION IR SOLN
Status: DC | PRN
  Administered 2013-11-23: 11:00:00

## 2013-11-23 MED ORDER — FENTANYL CITRATE 0.05 MG/ML IJ SOLN
INTRAMUSCULAR | Status: DC | PRN
  Administered 2013-11-23: 50 ug via INTRAVENOUS
  Administered 2013-11-23: 25 ug via INTRAVENOUS
  Administered 2013-11-23: 50 ug via INTRAVENOUS

## 2013-11-23 MED ORDER — SODIUM CHLORIDE 0.9 % IJ SOLN
INTRAMUSCULAR | Status: AC
  Filled 2013-11-23: qty 10

## 2013-11-23 MED ORDER — BUTAMBEN-TETRACAINE-BENZOCAINE 2-2-14 % EX AERO
INHALATION_SPRAY | CUTANEOUS | Status: DC | PRN
  Administered 2013-11-23: 2 via TOPICAL

## 2013-11-23 MED ORDER — PROMETHAZINE HCL 25 MG/ML IJ SOLN
INTRAMUSCULAR | Status: AC
  Filled 2013-11-23: qty 1

## 2013-11-23 NOTE — Op Note (Signed)
[facility] [address] [place], [address]   ENDOSCOPY PROCEDURE REPORT  PATIENT: [name] [name]  MR#: [id number] BIRTHDATE: 1946/05/10 , 67  yrs. old GENDER: Male  ENDOSCOPIST: Barney Drain, MD REFERRED [id number] [name], M.D.  PROCEDURE DATE: 11/23/2013 PROCEDURE:   EGD w/ biopsy  INDICATIONS:Anemia. MEDICATIONS: TCS+ Fentanyl 50 mcg IV and Versed 2 mg IV TOPICAL ANESTHETIC:   Cetacaine Spray  DESCRIPTION OF PROCEDURE:     Physical exam was performed.  Informed consent was obtained from the patient after explaining the benefits, risks, and alternatives to the procedure.  The patient was connected to the monitor and placed in the left lateral position.  Continuous oxygen was provided by nasal cannula and IV medicine administered through an indwelling cannula.  After administration of sedation, the patients esophagus was intubated and the EG-[id number] ([id number])  endoscope was advanced under direct visualization to the second portion of the duodenum.  The scope was removed slowly by carefully examining the color, texture, anatomy, and integrity of the mucosa on the way out.  The patient was recovered in endoscopy and discharged home in satisfactory condition.   ESOPHAGUS: The mucosa of the esophagus appeared normal.  STOMACH: Mild non-erosive gastritis (inflammation) was found in the gastric antrum.  Multiple biopsies were performed.   DUODENUM: The duodenal mucosa showed no abnormalities in the bulb and second portion of the duodenum. COMPLICATIONS:   None  ENDOSCOPIC IMPRESSION: 1.   ANEMIA MOST LIKELY DUE TO CHRONIC DISEASE AND EXACERBATED BY COLITIS/GASTRITIS 2.   MILD Non-erosive gastritis  RECOMMENDATIONS: ADVANCE DIET DAILY PPI AWAIT BIOPSY   REPEAT EXAM:   _______________________________ Lorrin MaisBarney Drain, MD 11/23/2013 2:53 PM

## 2013-11-23 NOTE — Progress Notes (Signed)
Report called to the [facility].

## 2013-11-23 NOTE — Progress Notes (Signed)
Dr Aline Brochure in Endo room to assess pts recent knee surgery site.  Incision Site clean & dry, & intact.  Per verbal order from Dr Aline Brochure replace dressing with Mepilex covering, which was done.

## 2013-11-23 NOTE — H&P (Signed)
Primary Care Physician:  [name], MD Primary Gastroenterologist:  Dr. Oneida Alar  Pre-Procedure History & Physical: HPI:  [name] is a 68 y.o. male here for Anemia/BRBPR.  Past Medical History  Diagnosis Date  . Diastolic heart failure     Pedal edema, "a long time ago"  . Hypertension     Exercise induced  . NSVT (nonsustained ventricular tachycardia)     Exercise induced  . 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 Obesity   . Degenerative joint disease   . Benign prostatic hypertrophy     Nocturia  . Erectile dysfunction   . High cholesterol   . CAD (coronary artery disease)     a.  NSTEMI 10/2012 s/p DES to LAD & DES to 1st diagonal with residual diffuse nonobstructive dz in LCx/RCA   . Chronic kidney disease, stage 3, mod decreased GFR     Creatinine of 1.5 in 03/2009  . Diabetes mellitus, type II 12/10/2012.  Marland Kitchen Anxiety   . Depression     hx of  . Flu 2013    hx of    Past Surgical History  Procedure Laterality Date  . Cardiac catheterization      with stent placement  . Back surgery  2007    x2  . Hernia repair    . Total knee arthroplasty Right 11/15/2013    Procedure: RIGHT TOTAL KNEE ARTHROPLASTY;  Surgeon: Mauri Pole, MD;  Location: [facility];  Service: Orthopedics;  Laterality: Right;    Prior to Admission medications   Medication Sig Start Date End Date Taking? Authorizing Provider  acetaminophen (TYLENOL) 325 MG tablet Take 1-2 tablets (325-650 mg total) by mouth every 6 (six) hours as needed. 11/17/13  Yes Lucille Passy Babish, PA-C  allopurinol (ZYLOPRIM) 300 MG tablet Take 300 mg by mouth daily.   Yes Historical Provider, MD  ALPRAZolam Duanne Moron) 0.5 MG tablet Take 0.5 mg by mouth 2 (two) times daily as needed for anxiety.   Yes Historical Provider, MD  amLODipine (NORVASC) 10 MG tablet Take 10 mg by mouth daily.   Yes Historical Provider, MD  docusate sodium 100 MG CAPS Take 100 mg by mouth 2 (two) times daily.  11/17/13  Yes Lucille Passy Babish, PA-C  ferrous sulfate 325 (65 FE) MG tablet Take 1 tablet (325 mg total) by mouth 3 (three) times daily after meals. 11/17/13  Yes Lucille Passy Babish, PA-C  furosemide (LASIX) 40 MG tablet Take 40 mg by mouth daily.  02/08/13  Yes Yehuda Savannah, MD  gabapentin (NEURONTIN) 300 MG capsule Take 300 mg by mouth 2 (two) times daily.    Yes Historical Provider, MD  metoprolol (LOPRESSOR) 50 MG tablet Take 50 mg by mouth 2 (two) times daily.   Yes Historical Provider, MD  NITROSTAT 0.4 MG SL tablet Place 0.4 mg under the tongue every 5 (five) minutes as needed for chest pain.  06/12/13  Yes Historical Provider, MD  ONGLYZA 5 MG TABS tablet Take 5 mg by mouth daily.  02/09/13  Yes Historical Provider, MD  oxyCODONE (OXY IR/ROXICODONE) 5 MG immediate release tablet Take 1-3 tablets (5-15 mg total) by mouth every 4 (four) hours as needed for severe pain. 11/17/13  Yes Lucille Passy Babish, PA-C  polyethylene glycol (MIRALAX / GLYCOLAX) packet Take 17 g by mouth 2 (two) times daily. 11/17/13  Yes Lucille Passy Babish, PA-C  pravastatin (PRAVACHOL) 20 MG tablet Take 20 mg by  mouth at bedtime.    Yes Historical Provider, MD  docusate sodium (COLACE) 100 MG capsule Take 1 capsule (100 mg total) by mouth 2 (two) times daily. 11/21/13   Donne Hazel, MD    Allergies as of 11/19/2013 - Review Complete 11/19/2013  Allergen Reaction Noted  . Niacin and related Other (See Comments) 10/24/2012    Family History  Problem Relation Age of Onset  . Hypertension Other   . Cancer Other   . Heart disease    . Diabetes    . Colon cancer Neg Hx     History   Social History  . Marital Status: Widowed    Spouse Name: N/A    Number of Children: N/A  . Years of Education: N/A   Occupational History  . Disabled    Social History Main Topics  . Smoking status: Former Smoker -- 1.00 packs/day for 30 years    Types: Cigarettes    Quit date: 12/23/1992  . Smokeless  tobacco: Never Used  . Alcohol Use: No  . Drug Use: No  . Sexual Activity: Not on file   Other Topics Concern  . Not on file   Social History Narrative   Married    Review of Systems: See HPI, otherwise negative ROS   Physical Exam: BP 134/69  Pulse 86  Temp(Src) 97.7 F (36.5 C) (Oral)  Resp 20  Ht 6' 3"  (1.905 m)  Wt 285 lb (129.275 kg)  BMI 35.62 kg/m2  SpO2 99% General:   Alert,  pleasant and cooperative in NAD Head:  Normocephalic and atraumatic. Neck:  Supple; Lungs:  Clear throughout to auscultation.    Heart:  Regular rate and rhythm. Abdomen:  Soft, nontender and nondistended. Normal bowel sounds, without guarding, and without rebound.   Neurologic:  Alert and  oriented x4;  grossly normal neurologically.  Impression/Plan:     Anemia/BRBPR  PLAN:  1. TCS/?EGD TODAY

## 2013-11-23 NOTE — Progress Notes (Signed)
Physical Therapy Treatment Patient Details Name: [name] MRN: [id number] DOB: 12/09/1946 Today's Date: 11/23/2013 Time: 0321-2248 PT Time Calculation (min): 34 min  PT Assessment / Plan / Recommendation  History of Present Illness Pt had EGD earlier today.  He is recently back from Endo suite and is alert and oriented, hungry.   PT Comments   Pt able to work on transfers/bed mobility and gait today.  He declined TKR exercise as he was hungry and wanted lunch.  Transfers and gait continue to be very labored, exacerbated by severe OA throughout.  He is very well motivated and does the best he can.  Follow Up Recommendations        Does the patient have the potential to tolerate intense rehabilitation     Barriers to Discharge        Equipment Recommendations       Recommendations for Other Services    Frequency     Progress towards PT Goals Progress towards PT goals: Progressing toward goals  Plan Current plan remains appropriate    Precautions / Restrictions     Pertinent Vitals/Pain     Mobility  Bed Mobility Supine to Sit: 1: +1 Total assist;With rails;HOB elevated Supine to Sit: Patient Percentage: 20% (bed soaked in urine which made transfer more difficult) Transfers Sit to Stand: 2: Max assist;From elevated surface;With upper extremity assist Stand to Sit: 2: Max assist;To chair/3-in-1;With upper extremity assist Details for Transfer Assistance: pt instructed in advancing RLE during descent to chair in order to protect the right knee Ambulation/Gait Ambulation/Gait Assistance: 4: Min guard Ambulation Distance (Feet): 45 Feet Assistive device: Rolling walker Gait Pattern: Decreased hip/knee flexion - right;Trunk flexed;Step-through pattern Gait velocity: slow and labored General Gait Details: pt instructed in increasing thoracic extension during gait...he was successful to a small degree Stairs: No Wheelchair Mobility Wheelchair Mobility: No    Exercises      PT Diagnosis:    PT Problem List:   PT Treatment Interventions:     PT Goals (current goals can now be found in the care plan section)    Visit Information  Last PT Received On: 11/23/13 History of Present Illness: Pt had EGD earlier today.  He is recently back from Endo suite and is alert and oriented, hungry.    Subjective Data      Cognition  Cognition Arousal/Alertness: Awake/alert Behavior During Therapy: WFL for tasks assessed/performed Overall Cognitive Status: Within Functional Limits for tasks assessed    Balance     End of Session PT - End of Session Equipment Utilized During Treatment: Gait belt Activity Tolerance: Patient tolerated treatment well Patient left: in chair;with call bell/phone within reach Nurse Communication: Mobility status   GP     Sable Feil 11/23/2013, 2:10 PM

## 2013-11-23 NOTE — Clinical Social Work Note (Signed)
Pt d/c today back to [facility]. Pt and facility aware and agreeable. Pt to transfer with RN. D/C summary faxed.  Benay Pike, [place]

## 2013-11-23 NOTE — Progress Notes (Signed)
UR chart review completed.  

## 2013-11-23 NOTE — Op Note (Signed)
[facility] [address] [place], [address]   COLONOSCOPY PROCEDURE REPORT  PATIENT: [name], [name]  MR#: [id number] BIRTHDATE: 1946/01/25 , 67  yrs. old GENDER: Male ENDOSCOPIST: Barney Drain, MD REFERRED XB:[id number] [name], M.D. PROCEDURE DATE:  11/23/2013 PROCEDURE:   Colonoscopy with biopsy and Colonoscopy with cold biopsy polypectomy INDICATIONS:Rectal Bleeding and Anemia, non-specific. MEDICATIONS: Fentanyl 100 mcg IV, Versed 8 mg IV, and PREOP: Promethazine (Phenergan) 12.15m IV  DESCRIPTION OF PROCEDURE:    Physical exam was performed.  Informed consent was obtained from the patient after explaining the benefits, risks, and alternatives to procedure.  The patient was connected to monitor and placed in left lateral position. Continuous oxygen was provided by nasal cannula and IV medicine administered through an indwelling cannula.  After administration of sedation and rectal exam, the patients rectum was intubated and the [id number]  colonoscope was advanced under direct visualization to the ileum.  The scope was removed slowly by carefully examining the color, texture, anatomy, and integrity mucosa on the way out.  The patient was recovered in endoscopy and discharged home in satisfactory condition.    COLON FINDINGS: The mucosa appeared normal in the terminal ileum. Two sessile polyps measuring 2-5 mm in size were found at the cecum/in the ascending colon.  A polypectomy was performed with cold forceps.  , A linear patch of abnormal mucosa was found at the splenic flexure, descending colon, sigmoid colon.  The mucosa was edematous, erythematous, ulcerated and friable.    Multiple biopsies were performed using cold forceps.  , Small nodule was found in the rectum.  Multiple biopsies were performed.  , Proctitis was found in the rectum.  Multiple biopsies were performed using cold forceps.  , and Moderate sized internal hemorrhoids were  found.    PREP QUALITY: good.  CECAL W/D TIME: 28 minutes     COMPLICATIONS: None  ENDOSCOPIC IMPRESSION: 1.  TWO COLON POLYPS REMOVED 2.   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   Moderate sized internal hemorrhoids 4.   Small nodule in the rectum  RECOMMENDATIONS: ADVANCE DIET DAILY PPI AWAIT BIOPSY    _______________________________ eLorrin MaisBarney Drain MD 11/23/2013 2:50 PM

## 2013-11-24 ENCOUNTER — Non-Acute Institutional Stay (SKILLED_NURSING_FACILITY): Payer: Medicare Other | Admitting: Internal Medicine

## 2013-11-24 ENCOUNTER — Other Ambulatory Visit: Payer: Self-pay | Admitting: *Deleted

## 2013-11-24 DIAGNOSIS — Z96651 Presence of right artificial knee joint: Secondary | ICD-10-CM

## 2013-11-24 DIAGNOSIS — M109 Gout, unspecified: Secondary | ICD-10-CM

## 2013-11-24 DIAGNOSIS — Z96659 Presence of unspecified artificial knee joint: Secondary | ICD-10-CM

## 2013-11-24 DIAGNOSIS — R509 Fever, unspecified: Secondary | ICD-10-CM

## 2013-11-24 DIAGNOSIS — N183 Chronic kidney disease, stage 3 unspecified: Secondary | ICD-10-CM

## 2013-11-24 LAB — GLUCOSE, CAPILLARY: Glucose-Capillary: 154 mg/dL — ABNORMAL HIGH (ref 70–99)

## 2013-11-24 MED ORDER — ALPRAZOLAM 0.5 MG PO TABS: ORAL_TABLET | ORAL | Status: DC

## 2013-11-24 MED ORDER — OXYCODONE HCL 5 MG PO TABS: ORAL_TABLET | ORAL | Status: DC

## 2013-11-24 NOTE — Progress Notes (Signed)
Patient ID: [name], male   DOB: [birth year], 67 y.o.   MRN: [id number]  Facility; [facility] Chief complaint; admission to SNF post admit to [facility] from November 28 to December 2. Previously was in the building after having a right total knee replacement, I did not see him on his prior admit  History; this is a 67 year old man who lives independently in [place] who was admitted initially after undergoing a right total knee replacement. He was admitted to this SNF for postoperative rehabilitation. He developed a fever and also a hemoglobin below 7 and was referred back to the hospital. Initially there was some concern that he either had a postoperative cellulitis or possibly a poor surgical joint infection however he was seen by orthopedics and not felt her be requiring antibiotics. Etiology of his fever was unclear. Urine culture was negative chest x-ray was negative and blood cultures are listed as pending I reviewed these and these were negative as well. Regards to his postoperative anemia he was transfused 2 units and discharged with a hemoglobin of 8.7 on December 2 per he was given iron. It was felt to be safe to resume DVT prophylaxis with xarelto at 10 mg.  Patient's major complaint today besides pain in his right knee his pain in his left elbow, left knee. 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 He also has low back pain and previous low back surgery x2 is noted.   has a past medical history of Diastolic heart failure; Hypertension; NSVT (nonsustained ventricular tachycardia); Tobacco abuse, in remission; Obesity; Degenerative joint disease; Benign prostatic hypertrophy; Erectile dysfunction; High cholesterol; CAD (coronary artery disease); Chronic kidney disease, stage 3, mod decreased GFR; Diabetes mellitus, type II (12/10/2012.); Anxiety; Depression; and Flu (2013).  Past Surgical History  Procedure  Laterality Date  . Cardiac catheterization      with stent placement  . Back surgery  2007    x2  . Hernia repair    . Total knee arthroplasty Right 11/15/2013    Procedure: RIGHT TOTAL KNEE ARTHROPLASTY;  Surgeon: Mauri Pole, MD;  Location: [facility];  Service: Orthopedics;  Laterality: Right;    Current medication; Tylenol every 6 when necessary, allopurinol 300 daily, Xanax 0.25 twice a day, Norvasc 10 mg a day, ferrous sulfate 325 3 times daily, Lasix 40 daily, Neurontin 302 times a day, Lopressor 50 mg twice a day, nitroglycerin sublingual when necessary, onglyza milligrams daily, oxycodone when necessary, protonic 40 daily, Pravachol 20 daily  Socially; patient states he lives on his own in [place]. He has a few stairs to manage. Previously we was using a cane was independent with ADLs and IDL . reports that he quit smoking about 20 years ago. His smoking use included Cigarettes. He has a 30 pack-year smoking history. He has never used smokeless tobacco. He reports that he does not drink alcohol or use illicit drugs.  family history includes Cancer in his other; Diabetes in an other family member; Heart disease in an other family member; Hypertension in his other. There is no history of Colon cancer.  Review of systems Respiratory no shortness of breath Cardiac no clear chest pain Abdomen; no abdominal pain Musculoskeletal; he has chronic low back pain, very limited range of motion of both shoulder, left elbow pain  Physical examination temperature is 98.7 although he apparently ran a fever overnight, HEENT; oral exam is normal. Respiratory clear entry bilaterally  Cardiovascular heart sounds are normal there is no murmurs or gallops Abdomen no liver spleen no tenderness or. Musculoskeletal; has very limited range of both shoulders but no active synovitis. 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 There is swelling and erythema surrounding both ankles. His left knee is warm and enlarged.  His surgical knee is swollen and warm there is moderate effusion. The right leg is swollen however there is no evidence of DVT I suspect this is post surgical blood loss.  Impression/plan #1 status post right total knee replacement for tricompartmental osteoarthritis. There is some concern in the hospital however I don't believe this is currently infected. The knee will need to be watched. He does have a mild to moderate joint effusion at this point. 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 He is on allopurinol I may try him on colchicine #3 very limited range in his bilateral shoulders which will make rehabilitation somewhat difficult question chronic rotator cuff problem #4 coronary artery disease; no evidence that this is active #5 intermittent low-grade fever don't see an obvious cause of this although we'll need to watch his right knee #6 hypertension #7 postoperative anemia requiring transfusion in the hospital, I will repeat his hemoglobin #8 type 2 diabetes with probable diabetic neuropathy he is only on oral agents. We'll check a hemoglobin A1c #9 chronic renal insufficiency presumably related to diabetes. I will recheck this

## 2013-11-25 ENCOUNTER — Encounter (HOSPITAL_COMMUNITY): Payer: Self-pay | Admitting: Gastroenterology

## 2013-11-26 LAB — CULTURE, BLOOD (ROUTINE X 2): Culture: NO GROWTH

## 2013-11-26 LAB — GLUCOSE, CAPILLARY
Glucose-Capillary: 107 mg/dL — ABNORMAL HIGH (ref 70–99)
Glucose-Capillary: 138 mg/dL — ABNORMAL HIGH (ref 70–99)

## 2013-11-27 LAB — GLUCOSE, CAPILLARY
Glucose-Capillary: 110 mg/dL — ABNORMAL HIGH (ref 70–99)
Glucose-Capillary: 135 mg/dL — ABNORMAL HIGH (ref 70–99)

## 2013-11-29 ENCOUNTER — Non-Acute Institutional Stay (SKILLED_NURSING_FACILITY): Payer: Medicare Other | Admitting: Internal Medicine

## 2013-11-29 DIAGNOSIS — D649 Anemia, unspecified: Secondary | ICD-10-CM

## 2013-11-29 DIAGNOSIS — M109 Gout, unspecified: Secondary | ICD-10-CM

## 2013-11-29 LAB — GLUCOSE, CAPILLARY: Glucose-Capillary: 124 mg/dL — ABNORMAL HIGH (ref 70–99)

## 2013-11-29 NOTE — Progress Notes (Signed)
Patient ID: [name], male   DOB: Dec 17, 1946, 67 y.o.   MRN: [id number] Facility; [facility] Chief complaint followup anemia and gout History; this is a gentleman that I admitted to the facility last week have. He had already been sent back to the hospital with a hemoglobin of 6.7 he was transfused in the hospital to a high of 8.7 however his hemoglobin today is 7.2. His cells are normochromic normocytic white count is 7.9 differential count was normal platelet count at 491. Patient tells me he has a long history of anemia in the looking back into [place] I am able to see that he saw hematology earlier this year at which time his hemoglobin was 10.9. Iron studies showed a iron level of 53 a TIBC of 230 and a ferritin of 1265. The hematologist felt that this is likely anemia of chronic disease. A B12 ferritin and erythropoietin level were ordered although I don't see these results. The patient states his hemoglobin normally runs around 8 although I don't see one in his preoperative state.  He also has chronic tophaceous gout his uric acid level was 7.1. I was impressed by pain in the metatarsal heads as well as ankles. Started him on colchicine on December 3.  Physical examination Musculoskeletal changes of chronic tophaceous gout in his hands MCPs and him and PIPs. The active arthritis in his MTPs and ankles seems a lot better on the colchicine and they'll probably continue this. His right total knee replaced as some effusion although this looks much the same as when I saw the week ago. I don't believe there is any active infection.  Impression/plan Anemia; this is most likely chronic disease related to either chronic inflammation and/or chronic renal insufficiency. His iron studies in the past have been unimpressive. In spite of a hemoglobin of 7.2 he remains completely asymptomatic and there is no overt blood loss. I will repeat this with a reticulocyte count. For now no additional  investigations. Is probably also some blood loss related to his surgery.  Acute on chronic gout; a uric acid level of 7.1 [in the normal range] does not protrude [name] the fact that [name] has remarkably improved there is suggests that gout was correct. I was most worried about his metatarsophalangeal some ankles.

## 2013-12-03 LAB — GLUCOSE, CAPILLARY: Glucose-Capillary: 133 mg/dL — ABNORMAL HIGH (ref 70–99)

## 2013-12-06 ENCOUNTER — Telehealth: Payer: Self-pay | Admitting: Gastroenterology

## 2013-12-06 LAB — GLUCOSE, CAPILLARY: Glucose-Capillary: 104 mg/dL — ABNORMAL HIGH (ref 70–99)

## 2013-12-06 NOTE — Telephone Encounter (Signed)
Please call pt. [name] had simple adenomas removed from HIS colon. HIS COLON BIOPSIES SHOW CHANGES OF LOW BLOOD SUPPLY LIKELY DUE TO HIS JUST HAVING SURGERY  AND HAVING DIABETES/HARDENING OF THE ARTERIES DISEASE. His stomach Bx shows mild gastritis.   [name] SHOULD FOLLOW UP WITH DR. Gala Romney IN 4-6 WEEKS. [name] CAN ASK HIM IF ANY ADDITIONAL WORKUP IS NECESSARY. Continue OMP 30 minutes prior to HIS FIRST MEAL. FOLLOW A HIGH FIBER/DIABETIC DIET.  TCS IN 10 YEARS.

## 2013-12-06 NOTE — Telephone Encounter (Signed)
Recall in Epic for TCS

## 2013-12-07 LAB — GLUCOSE, CAPILLARY
Glucose-Capillary: 114 mg/dL — ABNORMAL HIGH (ref 70–99)
Glucose-Capillary: 141 mg/dL — ABNORMAL HIGH (ref 70–99)

## 2013-12-07 NOTE — Telephone Encounter (Signed)
Mailed letter for pt to call for results.

## 2013-12-07 NOTE — Telephone Encounter (Signed)
Did not mail letter. Called and spoke to Marlou Sa at [facility]. Also faxing a copy of the info to her at [phone].

## 2013-12-07 NOTE — Telephone Encounter (Signed)
Tried to call pt. Many rings and no answer. Routing to Chelsey to schedule OV in 4-6 weeks and nic TCS.

## 2013-12-08 ENCOUNTER — Non-Acute Institutional Stay (SKILLED_NURSING_FACILITY): Payer: Medicare Other | Admitting: Internal Medicine

## 2013-12-08 DIAGNOSIS — D649 Anemia, unspecified: Secondary | ICD-10-CM

## 2013-12-08 DIAGNOSIS — M109 Gout, unspecified: Secondary | ICD-10-CM

## 2013-12-08 DIAGNOSIS — N183 Chronic kidney disease, stage 3 unspecified: Secondary | ICD-10-CM

## 2013-12-08 LAB — GLUCOSE, CAPILLARY
Glucose-Capillary: 118 mg/dL — ABNORMAL HIGH (ref 70–99)
Glucose-Capillary: 166 mg/dL — ABNORMAL HIGH (ref 70–99)

## 2013-12-08 NOTE — Progress Notes (Signed)
Patient ID: [name], male   DOB: [birth year], 67 y.o.   MRN: [id number] [facility] Chief complaint; followup issues status post right total knee replacement. History; [name] is a gentleman who came here after an elective right total knee replacement. He is in his admission here he was reasonably incapacitated by severe arthritis in both ankles and across his metatarsal phalangeal superlative but this was gout and placed him on colchicine with fairly good improvement the. He remains on 0.6 mg twice a day he went to see his orthopedic surgeon yesterday who pronounced his stable from a surgical point of view recommended continuing aspirin for 4 weeks however he is not on aspirin. He was placed on xarelto 10 mg for DVT prophylaxis when he came in he her not just do to the knee surgery but his incapacity secondary to acute gout this is now better he is ambulatory  The patient has a history of coronary artery disease and has a stent in place. I've looked at his notes in the record and he was Brilanta twisting discontinued prior to this surgery . I believe he was also on ASA 81. Before he goes home we will transition him off xarelto back on to the Brilinta plus or minus ASA.  His gout is a lot better. He has chronic anemia which I believe is secondary to chronic renal insufficiency. On December 8 he is BUN was 33 creatinine of 1.41 and his hemoglobin was 7.2. His last hemoglobin was 7.6 on December 10 he does not have a reticulocytosis  Review of systems Cardiac no exertional chest pain Musculoskeletal right knee incision appears to be stable, gout in his ankles and metatarsals considerably improved he does not have any active tenderness  Physical exam Cardiac; heart sounds are normal Musculoskeletal right knee is still somewhat swollen however this appears to be a stable situation. Much better in terms of his ankles bilaterally and metatarsals as noted  Impression/plan #1  coronary artery disease I will resume his Brilinta Prior to his discharge from the facility. He'll need to be off his xarelto. He is ambulatory I think this can be done early next week #2 anemia; he has been to see a hematologist. The exact nature of this is not clear. I will repeat a CBC on Monday #3 gout I think he should be continued on his colchicine as this really seems to have helped the. His uric acid level was 7.1 however this does not preclude active gout.

## 2013-12-08 NOTE — Telephone Encounter (Signed)
CS put reminder in epic for TCS. I called [facility] to schedule OV with [facility] and they are aware of OV on 1/23 at 1030

## 2013-12-10 LAB — GLUCOSE, CAPILLARY: Glucose-Capillary: 98 mg/dL (ref 70–99)

## 2013-12-11 ENCOUNTER — Non-Acute Institutional Stay (SKILLED_NURSING_FACILITY): Payer: Medicare Other | Admitting: Internal Medicine

## 2013-12-11 DIAGNOSIS — M109 Gout, unspecified: Secondary | ICD-10-CM | POA: Insufficient documentation

## 2013-12-11 DIAGNOSIS — D649 Anemia, unspecified: Secondary | ICD-10-CM

## 2013-12-11 LAB — GLUCOSE, CAPILLARY: Glucose-Capillary: 90 mg/dL (ref 70–99)

## 2013-12-11 NOTE — Progress Notes (Signed)
Patient ID: [name], male   DOB: 03-12-1946, 67 y.o.   MRN: [id number]  This is an acute visit.  Level care skilled care  Facility [facility].  Chief complaint-acute visit secondary to left foot pain-gout  History of present illness.  Patient is a pleasant elderly male who's had somewhat of a complicated course.  Recently had a right knee replacement which appears to be relatively stable.  Mr. this was complicated with some postop anemia-he did require transfusion.  Hemoglobin continues to be somewhat low but appears to be improving slowly.  His main complaint tonight is left ankle pain he attributes to gout.  He does have a significant gout history per chart review with likely tophaceous gout of his fourth and fifth digits of his hands-he also says he has got pain in his ankles frequently.  He continues on allopurinol 300 mg day as well as colchicine 0.6 mg twice a day.  Recent uric acid level was 7.1-Dr. Dellia Nims has continued him on his gout medication secondary again to his significant history here.  Tonight he is complaining now fairly acute pain of his left ankle-he is just received his when necessary OxyContin he got 15 mg  --he can get 5-up to 15 mg every 4 hours when necessary apparently at night he frequently requires a higher dose.  He reports his knee pain is controlled he does have a significant history of neuropathy as well I note he is on Neurontin 300 mg twice a day.  Vital signs continued to be stable.  Family medical social history as been reviewed per admission note on 11/24/2013.  Medications have been reviewed per MAR.  Review of systems.  In general denies any fever or chills.  Respiratory no complaints shortness of breath or cough.  Cardiac no chest pain.  Muscle skeletal-is complaining of significant pain more so his left ankle area pain in his right knee appears to be well controlled he does have significant history of gout.  Physical exam.  He  is afebrile pulse 63 respirations 20 blood pressure 99/56.  In general this is a pleasant elderly male in no distress .  His skin is warm and dry he does have tophaceuos gout changes to his fourth and fifth fingers bilaterally  Heart is regular rate and rhythm without murmur gallop or rub.  Chest is clear to auscultation.  Muscle skeletal-does have changes to his hands as noted above.  Left foot has baseline edema there is tenderness to palpation and some mild edema to the ankle area this is tender when area is palpated I did not really appreciate tenderness to palpation of his toes or joints in the forefoot  Did not really note any erythema of the ankle or sign of infection here.  Of note his right knee surgical site appears to be healing unremarkably there is some mild effusion here slightly warm to touch but this appears to be healing unremarkably no sign of infection drainage or bleeding largely crusted over  Labs  12/06/2013.  WBC 6.0 hemoglobin 7.9 platelets 387.  11/29/2013.  Sodium 135 potassium 4.5 BUN 33 creatinine 1.41.  Uric acid-7.1.  Assessment and plan.  #1-left ankle pain-this is most likely gout judging from patient's history-he continues on allopurinol as well as colchicine-he is really hesitant to try to take any stronger  pain medication.  He did request elevation with a pillow of the area we did do this and he said he did receive significant relief I have written  an order to encourage elevation of the left leg and heel I suspect any pressure against this area would be quite uncomfortable and appears simply elevating the area and keeping pressure off the has given him significant relief-continue to monitor he again he does have significant when necessary medications including the OxyContin every 4 hours when necessary.  #2-anemia-this appears to be slowly improving but will need close monitoring he is on iron 3 times a day Dr. Dellia Nims has already ordered lab  work including a CBC and BMP for next Monday clinically appears stable.  [id number].  11/29/2013.

## 2013-12-13 LAB — GLUCOSE, CAPILLARY: Glucose-Capillary: 107 mg/dL — ABNORMAL HIGH (ref 70–99)

## 2013-12-14 LAB — GLUCOSE, CAPILLARY: Glucose-Capillary: 134 mg/dL — ABNORMAL HIGH (ref 70–99)

## 2013-12-15 LAB — GLUCOSE, CAPILLARY: Glucose-Capillary: 144 mg/dL — ABNORMAL HIGH (ref 70–99)

## 2013-12-16 LAB — GLUCOSE, CAPILLARY: Glucose-Capillary: 98 mg/dL (ref 70–99)

## 2013-12-17 LAB — GLUCOSE, CAPILLARY: Glucose-Capillary: 99 mg/dL (ref 70–99)

## 2013-12-19 LAB — GLUCOSE, CAPILLARY: Glucose-Capillary: 98 mg/dL (ref 70–99)

## 2013-12-20 LAB — GLUCOSE, CAPILLARY: Glucose-Capillary: 101 mg/dL — ABNORMAL HIGH (ref 70–99)

## 2013-12-21 LAB — GLUCOSE, CAPILLARY: Glucose-Capillary: 161 mg/dL — ABNORMAL HIGH (ref 70–99)

## 2013-12-23 LAB — GLUCOSE, CAPILLARY: Glucose-Capillary: 240 mg/dL — ABNORMAL HIGH (ref 70–99)

## 2013-12-24 LAB — GLUCOSE, CAPILLARY
GLUCOSE-CAPILLARY: 121 mg/dL — AB (ref 70–99)
Glucose-Capillary: 99 mg/dL (ref 70–99)

## 2013-12-25 LAB — GLUCOSE, CAPILLARY: Glucose-Capillary: 92 mg/dL (ref 70–99)

## 2013-12-27 LAB — GLUCOSE, CAPILLARY: Glucose-Capillary: 124 mg/dL — ABNORMAL HIGH (ref 70–99)

## 2013-12-28 ENCOUNTER — Non-Acute Institutional Stay (SKILLED_NURSING_FACILITY): Payer: Medicare Other | Admitting: Internal Medicine

## 2013-12-28 DIAGNOSIS — N183 Chronic kidney disease, stage 3 unspecified: Secondary | ICD-10-CM

## 2013-12-28 DIAGNOSIS — I10 Essential (primary) hypertension: Secondary | ICD-10-CM

## 2013-12-28 DIAGNOSIS — Z96651 Presence of right artificial knee joint: Secondary | ICD-10-CM

## 2013-12-28 DIAGNOSIS — E119 Type 2 diabetes mellitus without complications: Secondary | ICD-10-CM

## 2013-12-28 DIAGNOSIS — D649 Anemia, unspecified: Secondary | ICD-10-CM

## 2013-12-28 DIAGNOSIS — Z96659 Presence of unspecified artificial knee joint: Secondary | ICD-10-CM

## 2013-12-28 DIAGNOSIS — M109 Gout, unspecified: Secondary | ICD-10-CM

## 2013-12-28 NOTE — Progress Notes (Signed)
Patient ID: [name], male   DOB: March 13, 1946, 68 y.o.   MRN: [id number]   Facility; [facility] This is a discharge note   t chief complaint-discharge note   HPI---; this is a 68 year old man who lives independently in [place] who was admitted initially after undergoing a right total knee replacement. He was admitted to this SNF for postoperative rehabilitation. Shortly after admission developed a fever and also a hemoglobin below 7 and was referred back to the hospital. Initially there was some concern that he either had a postoperative cellulitis or possibly a poor surgical joint infection however he was seen by orthopedics and not felt her be requiring antibiotics. Etiology of his fever was unclear. Urine culture was negative chest x-ray was negative . Regards to his postoperative anemia he was transfused 2 units and discharged with a hemoglobin of 8.7 on December 2 per he was given iron. It was felt to be safe to resume DVT prophylaxis with xarelto at 10 mg--he has seen a hematologist and hemoglobin has been fairly stable during his stay here most recently 8.2 we'll need to update this before discharge.  In regards to anticoagulation.--He is on Xeralto   however per Dr. Janalyn Rouse previous note the plan is to start [place] before discharge    Patient's postop course with his knee has been fairly unremarkable he is now weight bearing and ambulating about the facility.  He has had issues with his gout he is now on allopurinol as well as colchicine and this appears to be stable he does not complaining of pain this evening and is looking forward to going home-he will have support from friends and family-using a straight cane which he already has.  He will need outpatient therapy.      has a past medical history of Diastolic heart failure; Hypertension; NSVT (nonsustained ventricular tachycardia); Tobacco abuse, in remission; Obesity; Degenerative joint disease; Benign  prostatic hypertrophy; Erectile dysfunction; High cholesterol; CAD (coronary artery disease); Chronic kidney disease, stage 3, mod decreased GFR; Diabetes mellitus, type II (12/10/2012.); Anxiety; Depression; and Flu (2013).    Past Surgical History   Procedure  Laterality  Date   .  Cardiac catheterization           with stent placement   .  Back surgery    2007       x2   .  Hernia repair       .  Total knee arthroplasty  Right  11/15/2013       Procedure: RIGHT TOTAL KNEE ARTHROPLASTY;  Surgeon: Mauri Pole, MD;  Location: [facility];  Service: Orthopedics;  Laterality: Right;        Current medication Tylenol 325 mg every 6 hours when necessary.  Sr. 50 mg every 6 hours. 4 more severe pain.  Allopurinol 300 mg daily.  Xanax 0.5 mg twice a day when necessary.  Colace 100 mg twice a day.  Colchicine 0.6 mg twice a day.  Due to lack suppository once a day when necessary.  Iron 325 mg 3 times a day.  Neurontin 300 mg twice a day.  Lopressor 50 mg twice a day.  MiraLax twice a day.  Mylanta every 4 hours when necessary.  Nitrostat when necessary.  Norvasc 10 mg daily.  Prilosec 20 mg daily.  Onglyza 5 mg daily.  Oxycodone 5 mg every 4 hours when necessary moderate pain-2 tabs every 4 hours when necessary severe pain.  Pravastatin 20 mg  each bedtime.  Xeralto 10 mg once a day again this will probably be changed to  [place] before discharge     Socially; patient states he lives on his own in [place]. He has a few stairs to manage. Previously we was using a cane was independent with ADLs and IDL--he will continue use the cane at home . reports that he quit smoking about 20 years ago. His smoking use included Cigarettes. He has a 30 pack-year smoking history. He has never used smokeless tobacco. He reports that he does not drink alcohol or use illicit drugs.   family history includes Cancer in his other; Diabetes in an other family member; Heart disease in  an other family member; Hypertension in his other. There is no history of Colon cancer.   Review of systems Gen. no fever chills or complaints this evening.  Skin does not complaining of any itching or rash.  Head ears eyes nose mouth and throat-does not complaining of any sore throat or visual changes  Respiratory no shortness of breath  Cardiac no clear chest pain  Abdomen; no abdominal pain  Musculoskeletal; he has chronic low back pain, very limited range of motion of both shoulder, left elbow pain--also a history of gout-this appears to be controlled on current medications   Physical examination Temperature 98.7 pulse 75 respirations 20 blood pressure 149/77-139/64-162/79 117/76 there's quite a bit of variability here weight is 284.5 this appears to be relatively stable the past 10 days initial weight was 275 back in early December he says he is eating better now  In general this is a pleasant male in no distress sitting comfortably on the side of his bed up for skin is warm and dry surgical site right knee appears unremarkable and well healed t, HEENT; oral exam is normal. Mucous membranes moist visual acuity appears intact Respiratory clear entry bilaterally no labored breathing Cardiovascular heart sounds are normal there is no murmurs or gallops--he does have some edema of his feet bilaterally-he says this increases usually when he is ambulating and is better  In  the morning after he's been lying in bed with his feet elevated Abdomen no liver spleen no tenderness positive bowel sounds. Musculoskeletal; has very limited range of both shoulders but no active synovitis. 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 These are not tender when palpated.  Illogic is grossly intact  cranial nerves intact speech is clear no lateralizing findings.  Psych he is alert and oriented pleasant and appropriate looking forward to going home  12/20/2013.  CBC 5.0 hemoglobin 8.2 platelets 191.  Sodium 136 potassium 4.2 BUN 27 creatinine 1.46.  12-08- 2014.  Hemoglobin A1c 6.5  11/19/2013.  Sodium 136 potassium 4.6 BUN 25 creatinine 1.56.  Liver function tests within normal limits except albumin of 2.4.    Marland Kitchen       .   Impression/plan #1 status post right total knee replacement for tricompartmental osteoarthritis-he appears to have done quite well in this regard he is followed by orthopedics--pain is well controlled on oxycodone.  Will need outpatient therapy and he has a straight cane to ambulate doing well with  This--he will have support t from friends and family although he does live alone although he appears to be quite independent In regards to  anticoagulation currently on Xeralto this most likely will be changed to [place] before discharge which he was on previously  #2  Gouty arthritis-at this point appears stable on allopurinol as well as colchicine   #3 coronary artery disease; no evidence that this is active has not really been an issue during his stay here   #4 hypertension--variable systolics but I do not see consistent elevations-we'll defer to primary care provider for followup as continue current medications for now #5 postoperative anemia requiring transfusion in the hospital, this appears to have stabilized we'll update lab for discharge he is followed by hematology--he is on iron #8 type 2 diabetes with probable diabetic neuropathy he is only on oral agents. In the globin A1c appears to be fairly satisfactory--blood sugars run from the high 90s to low 100s generally I only see one over 200 but this is quite rare #9 chronic renal insufficiency presumably related to diabetes. I will recheck this #25 -- CHF diastolic-this appears to be stable  he has gained a bit of weight but this has been stable the past 10 days-he is eating better the edema appears to be more dependent related but will need close followup as an outpatient--no complaints of shortness of breath or chest pain --continues on Lasix  Number 11 anxiety-he does take Xanax when necessary occasionally this appears stable  [id number]-of note greater than 30 minutes spent preparing this discharge summary              ]

## 2013-12-29 ENCOUNTER — Non-Acute Institutional Stay (SKILLED_NURSING_FACILITY): Payer: Medicare Other | Admitting: Internal Medicine

## 2013-12-29 ENCOUNTER — Telehealth: Payer: Self-pay | Admitting: *Deleted

## 2013-12-29 DIAGNOSIS — I25810 Atherosclerosis of coronary artery bypass graft(s) without angina pectoris: Secondary | ICD-10-CM

## 2013-12-29 DIAGNOSIS — M109 Gout, unspecified: Secondary | ICD-10-CM

## 2013-12-29 LAB — GLUCOSE, CAPILLARY
Glucose-Capillary: 92 mg/dL (ref 70–99)
Glucose-Capillary: 141 mg/dL — ABNORMAL HIGH (ref 70–99)

## 2013-12-29 NOTE — Telephone Encounter (Signed)
Brilinta can be d/c altogether. ASA should be restarted as soon as possible when deemed ok with surgery.

## 2013-12-29 NOTE — Telephone Encounter (Signed)
They are sending patient home today need directions on Asprin and brilinta. Pt was taken off both for surgery. Wanting instructions before they let pt go home.

## 2013-12-29 NOTE — Telephone Encounter (Signed)
Santiago Glad from [facility] notified of medication change.

## 2013-12-30 LAB — GLUCOSE, CAPILLARY: GLUCOSE-CAPILLARY: 91 mg/dL (ref 70–99)

## 2013-12-31 LAB — GLUCOSE, CAPILLARY: GLUCOSE-CAPILLARY: 102 mg/dL — AB (ref 70–99)

## 2014-01-04 ENCOUNTER — Ambulatory Visit (HOSPITAL_COMMUNITY): Payer: Medicare Other | Admitting: Physical Therapy

## 2014-01-06 NOTE — Progress Notes (Signed)
Patient ID: [name], male   DOB: 1946-04-09, 68 y.o.   MRN: [id number]              PROGRESS NOTE  DATE:  12/29/2013    FACILITY: [facility]    LEVEL OF CARE:   SNF   Acute Visit   CHIEF COMPLAINT:  Pre-discharge review.     HISTORY OF PRESENT ILLNESS:  Mr. Kitt is a gentleman who came to [place] after having a right total knee replacement.  He came to [place] on Xarelto for DVT prophylaxis.    His major problem through the first part of his stay here was incapacitating acute arthritis across his metatarsophalangeals bilaterally and his bilateral ankles.  I thought this was most likely gout and, indeed, he responded nicely to colchicine at 0.6 b.i.d.    With regards to his underlying coronary artery disease, he has a history of a non-ST elevation MI in November 2014.  Reviewing his preoperative Cardiology consults suggested that he was on Brilinta and was instructed by Cardiology to take dual platelet therapy for a year.  The Brilinta was stopped on 11/09/2013 and it would appear that the recommendation was to not resume that and for him to continue on aspirin.  As mentioned, he was on Xarelto for DVT prophylaxis.  I think that can stop before he leaves here and have his aspirin resumed.     PHYSICAL EXAMINATION:   GENERAL APPEARANCE:  The patient is doing well.   CHEST/RESPIRATORY:  Clear air entry bilaterally.   CARDIOVASCULAR:  CARDIAC:   Heart sounds are normal.  He appears to be euvolemic.   MUSCULOSKELETAL:   EXTREMITIES:   BILATERAL LOWER EXTREMITIES:  He has no active joints in his ankles or feet.   NEUROLOGICAL:    BALANCE/GAIT:  He is ambulating with the assistance of a cane.    ASSESSMENT/PLAN:  I think he is ready for discharge.  He will go home with a cane.  He will go to outpatient physical therapy  Coronary artery disease.  I am going to restart his aspirin today, stop his Xarelto.  We will contact Cardiology about the Brilinta just to be certain that I  am reading their notes correctly (not restart this).     Anemia.  He has longstanding chronic anemia.  At one point, he saw Hematology.  I think this was felt to be chronic inflammation/disease, although I am not completely certain.  In any case, his hemoglobin from today is 8.3.   This is actually stable to improved since he has been here.    CPT CODE: 41287 (greater than 30 minutes)

## 2014-01-07 ENCOUNTER — Telehealth: Payer: Self-pay | Admitting: Cardiovascular Disease

## 2014-01-07 ENCOUNTER — Encounter: Payer: Self-pay | Admitting: *Deleted

## 2014-01-07 DIAGNOSIS — I10 Essential (primary) hypertension: Secondary | ICD-10-CM

## 2014-01-07 NOTE — Telephone Encounter (Signed)
Take 40 mg Lasix bid x 3 days with a BMET afterwards.

## 2014-01-07 NOTE — Telephone Encounter (Signed)
Patient has questions regarding his fluid pill / tgs

## 2014-01-07 NOTE — Telephone Encounter (Signed)
Pt states that pt went to the hospital and then went to a nursing home. While in the nursing home he has some swelling in legs and feet. Pt states he took a pill and a half of lasix yesterday and states some of his fluid went down, but not a lot. Please advise if you want more lasix for pt.

## 2014-01-07 NOTE — Telephone Encounter (Signed)
Made pt aware

## 2014-01-11 ENCOUNTER — Inpatient Hospital Stay (HOSPITAL_COMMUNITY): Admission: RE | Admit: 2014-01-11 | Payer: Medicare Other | Source: Ambulatory Visit | Admitting: Physical Therapy

## 2014-01-13 ENCOUNTER — Ambulatory Visit (HOSPITAL_COMMUNITY)
Admission: RE | Admit: 2014-01-13 | Discharge: 2014-01-13 | Disposition: A | Discharge status: Home / Self Care | Payer: Medicare Other | Source: Ambulatory Visit | Attending: Internal Medicine | Admitting: Internal Medicine

## 2014-01-13 DIAGNOSIS — M6281 Muscle weakness (generalized): Secondary | ICD-10-CM | POA: Insufficient documentation

## 2014-01-13 DIAGNOSIS — R269 Unspecified abnormalities of gait and mobility: Secondary | ICD-10-CM | POA: Insufficient documentation

## 2014-01-13 DIAGNOSIS — M25569 Pain in unspecified knee: Secondary | ICD-10-CM | POA: Insufficient documentation

## 2014-01-13 DIAGNOSIS — I10 Essential (primary) hypertension: Secondary | ICD-10-CM | POA: Insufficient documentation

## 2014-01-13 DIAGNOSIS — IMO0001 Reserved for inherently not codable concepts without codable children: Secondary | ICD-10-CM | POA: Insufficient documentation

## 2014-01-13 DIAGNOSIS — E119 Type 2 diabetes mellitus without complications: Secondary | ICD-10-CM | POA: Insufficient documentation

## 2014-01-13 DIAGNOSIS — M25669 Stiffness of unspecified knee, not elsewhere classified: Secondary | ICD-10-CM | POA: Insufficient documentation

## 2014-01-13 NOTE — Evaluation (Signed)
Physical Therapy Evaluation  Patient Details  Name: [name] MRN: [id number] Date of Birth: 01/07/1946  Today's Date: 01/13/2014 Time: 1345-1440 PT Time Calculation (min): 55 min Charges : 1 evaluation              Visit#: 1 of 12  Re-eval: 02/12/14 Assessment Diagnosis: R TKR  Surgical Date: 11/16/13 Next MD Visit: unscheduled, Dr Alvan Dame surgeon  Prior Therapy: yes inpateint rehab   Authorization: Medicare [employer]     Authorization Time Period:    Authorization Visit#: 1 of 10   Past Medical History:  Past Medical History  Diagnosis Date  . Diastolic heart failure     Pedal edema, "a long time ago"  . Hypertension     Exercise induced  . NSVT (nonsustained ventricular tachycardia)     Exercise induced  . 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 Obesity   . Degenerative joint disease   . Benign prostatic hypertrophy     Nocturia  . Erectile dysfunction   . High cholesterol   . CAD (coronary artery disease)     a.  NSTEMI 10/2012 s/p DES to LAD & DES to 1st diagonal with residual diffuse nonobstructive dz in LCx/RCA   . Chronic kidney disease, stage 3, mod decreased GFR     Creatinine of 1.5 in 03/2009  . Diabetes mellitus, type II 12/10/2012.  Marland Kitchen Anxiety   . Depression     hx of  . Flu 2013    hx of   Past Surgical History:  Past Surgical History  Procedure Laterality Date  . Cardiac catheterization      with stent placement  . Back surgery  2007    x2  . Hernia repair    . Total knee arthroplasty Right 11/15/2013    Procedure: RIGHT TOTAL KNEE ARTHROPLASTY;  Surgeon: Mauri Pole, MD;  Location: [facility];  Service: Orthopedics;  Laterality: Right;  . Colonoscopy with esophagogastroduodenoscopy (egd) N/A 11/23/2013    Procedure: COLONOSCOPY WITH POSSIBLE ESOPHAGOGASTRODUODENOSCOPY (EGD);  Surgeon: Danie Binder, MD;  Location[facility];  Service: Endoscopy;  Laterality: N/A;  possible EGD    Subjective   Symptoms/Limitations Symptoms: c/o leg weakness R LE post knee replacement , Date of surgery  11/16/2013, patient reports he would like to return to [facility], prior to surgery patient was swimming and using nu step at [facility]  Pertinent History: right TKR  Limitations: Standing;Walking;House hold activities How long can you sit comfortably?: 30 + minutes  Patient Stated Goals: return to [facility]  Pain Assessment Currently in Pain?: Yes Pain Score: 4  Pain Location: Knee Pain Orientation: Right Pain Type: Surgical pain Pain Onset: More than a month ago Pain Relieving Factors: pain medications   Precautions/Restrictions  Restrictions Weight Bearing Restrictions: No  Balance Screening Balance Screen Has the patient fallen in the past 6 months: No Has the patient had a decrease in activity level because of a fear of falling? : Yes Is the patient reluctant to leave their home because of a fear of falling? : No  Prior Functioning  Prior Function Level of Independence: Independent with basic ADLs  Able to Take Stairs?: Yes Driving: Yes Vocation: Retired Leisure: Hobbies-yes   Swimming at [facility] prior to surgery  Comments: cane use before surgery   Cognition/Observation Cognition Overall Cognitive Status: Within Functional Limits for tasks assessed Observation/Other Assessments Observations: presents using straight cane  Other Assessments: circumference R knee jt  line 52 cm, left 50 cm   Sensation/Coordination/Flexibility/Functional Tests Functional Tests Functional Tests: FOTO 57   Assessment RLE AROM (degrees) Right Knee Extension: 5 Right Knee Flexion: 110 Right Ankle Dorsiflexion: 5 Right Ankle Plantar Flexion: 15 RLE Strength Right Hip Flexion: 3/5 Right Knee Flexion: 3+/5 Right Knee Extension: 2+/5 Right Ankle Dorsiflexion: 4/5  Exercise/Treatments Mobility/Balance  Transfers Transfers: Sit to Stand: strong use of B UE  Ambulation/Gait Ambulation/Gait:  Yes Ambulation/Gait Assistance: 7: Independent Gait Pattern: Decreased stride length;Decreased step length - right;Decreased step length - left , narrow base of support     Standing Heel Raises: 10 reps Knee Flexion: Both;10 reps;1 set Seated   Supine Quad Sets: Strengthening;Right;10 reps Straight Leg Raises: 5 reps;Right;2 sets Sidelying   Physical Therapy Assessment and Plan PT Assessment and Plan Clinical Impression Statement: 68 yr old male s/p right TKR 11/15/13. He has notable R LE functional weakness in quadriceps and gluteal muscles. This wekaness plus tall height and hx of back pain limit his mobility status and transfers. At this time he requires OP therapy to reach goals and safely strengthen B LE. HE is eager to return to [facility] without further outpatient therapy but outpatient therapy is highly recommended. He can also benefit from HEP progression  Pt will benefit from skilled therapeutic intervention in order to improve on the following deficits: Abnormal gait;Decreased strength;Difficulty walking;Pain;Increased edema;Decreased range of motion;Decreased mobility;Decreased balance Rehab Potential: Good Clinical Impairments Affecting Rehab Potential: left knee djd, prior use of cane, use of lift chair at home, back pain  PT Frequency: Min 2X/week PT Duration: 6 weeks PT Treatment/Interventions: Gait training;Stair training;Functional mobility training;Therapeutic activities;Therapeutic exercise;Neuromuscular re-education;Balance training;Patient/family education;Modalities;Manual techniques PT Plan: focus on quad strength. edema control, HEP progress, functional LE strength, standing ther exercise, nu step, 3 in step ups  , SLR, sit to stands   Goals Home Exercise Program Pt/caregiver will Perform Home Exercise Program: Independently;For increased strengthening;For increased ROM PT Goal: Perform Home Exercise Program - Progress: Goal set today PT Short Term Goals Time to  Complete Short Term Goals: 2 weeks PT Short Term Goal 1: improve AROM right knee 0-115  PT Short Term Goal 2: improve R quad strength to 3+/5 for gait and transfers  PT Long Term Goals Time to Complete Long Term Goals: 4 weeks PT Long Term Goal 1: pateint able to transfer sit to stand standard chair less than 5 seconds and moderate use of UE for safe transfers at home  PT Long Term Goal 2: Patient able to ambulate 15 min with cane level terrain  Long Term Goal 3: patient able to ascend and descend 6 in steps with handrail use comfortably and safely   Problem List Patient Active Problem List   Diagnosis Date Noted  . Muscle weakness (generalized) 01/13/2014  . Knee stiffness 01/13/2014  . Gout 12/11/2013  . Anemia 11/19/2013  . Cellulitis 11/19/2013  . Expected blood loss anemia 11/17/2013  . Obese 11/17/2013  . S/P right TKA 11/15/2013  . Anemia, normocytic normochromic 01/12/2013  . Diabetes mellitus 12/10/2012  . 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 Overweight 11/17/2012  . Obstructive sleep apnea 11/15/2012  . Chronic kidney disease, stage 3, cardiorenal syndrome 08/23/2011  . Hypertension     PT Plan of Care PT Home Exercise Plan: written provided  PT Patient Instructions: written provided ,needs further education on HEP  Consulted and Agree with Plan of Care: Patient  GP Functional Assessment Tool Used: FOTO mobility  initial CK, goal CJ  Functional Limitation: Mobility: Walking and moving around Mobility: Walking and Moving Around Current Status [phone]): At least 40 percent but less than 60 percent impaired, limited or restricted Mobility: Walking and Moving Around Goal Status [phone]): At least 20 percent but less than 40 percent impaired, limited or restricted  Ayyan Sites 01/13/2014, 4:18 PM  Physician Documentation Your signature is required to indicate approval of the treatment plan as stated above.  Please sign and either send  electronically or make a copy of this report for your files and return this physician signed original.   Please mark one 1.__approve of plan  2. ___approve of plan with the following conditions.   ______________________________                                                          _____________________ Physician Signature                                                                                                             Date

## 2014-01-14 ENCOUNTER — Ambulatory Visit: Payer: Medicare Other | Admitting: Internal Medicine

## 2014-01-18 ENCOUNTER — Ambulatory Visit (HOSPITAL_COMMUNITY)
Admission: RE | Admit: 2014-01-18 | Discharge: 2014-01-18 | Disposition: A | Discharge status: Home / Self Care | Payer: Medicare Other | Source: Ambulatory Visit | Attending: Physical Therapy | Admitting: Physical Therapy

## 2014-01-18 ENCOUNTER — Ambulatory Visit (HOSPITAL_COMMUNITY): Payer: Medicare Other | Admitting: Physical Therapy

## 2014-01-18 DIAGNOSIS — M25669 Stiffness of unspecified knee, not elsewhere classified: Secondary | ICD-10-CM

## 2014-01-18 DIAGNOSIS — M6281 Muscle weakness (generalized): Secondary | ICD-10-CM

## 2014-01-18 NOTE — Progress Notes (Signed)
Physical Therapy Treatment Patient Details  Name: [name] MRN: [id number] Date of Birth: 05/16/1946  Today's Date: 01/18/2014 Time: 1345-1430 PT Time Calculation (min): 45 min TE 1345- 1430  Visit#: 2 of 12  Re-eval: 02/12/14 Assessment Diagnosis: R TKR  Surgical Date: 11/16/13 Next MD Visit: [facility] / surgeon Dr. Olin  Prior Therapy: yes inpateint rehab   Authorization: Medicare [employer]   Authorization Time Period:    Authorization Visit#: 2 of 10  Subjective: Symptoms/Limitations Symptoms: doing well, started at [facility] with basic exercises , current pain 1/10 right knee  Pertinent History: right TKR 11/16/13, ankle gout, left kne DJD    Exercise/Treatments; todays treatment        Stationary Bike: Nu step level 3 10 min ROM ,strength    Standing Heel Raises: 2 sets;10 reps Knee Flexion: Both;10 reps;1 set Hip ADduction: Strengthening;Both;1 set;15 reps Lateral Step Up: Right;20 reps;Hand Hold: 1;Step Height: 4" Forward Step Up: Right;20 reps;Hand Hold: 1;Step Height: 4" Functional Squat: 10 reps (sit to stamd fom raised treatment table )  No UE use        Physical Therapy Assessment and Plan PT Assessment and Plan Clinical Impression Statement: slow progression throught ther exercise today with progression to step up forward and lateral , verbal cues on nu step for full knee flexion and extension  PT Plan: focus on quad strength. edema control, HEP progress, functional LE strength, standing ther exercise, nu step, 3 in step ups     sit to stand exercises , LAQ       Goals- not met early treatment, 2nd visit  Home Exercise Program Pt/caregiver will Perform Home Exercise Program: For increased ROM;For increased strengthening PT Short Term Goals Time to Complete Short Term Goals: 2 weeks PT Short Term Goal 1: improve AROM right knee 0-115  PT Short Term Goal 1 - Progress: Progressing toward goal PT Short Term Goal 2: improve R quad strength to 3+/5 for gait and  transfers  PT Short Term Goal 2 - Progress: Progressing toward goal PT Long Term Goals Time to Complete Long Term Goals: 4 weeks PT Long Term Goal 1: pateint able to transfer sit to stand standard chair less than 5 seconds and moderate use of UE for safe transfers at home  PT Long Term Goal 2: Patient able to ambulate 15 min with cane level terrain  Long Term Goal 3: patient able to ascend and descend 6 in steps with handrail use comfortably and safely   Problem List Patient Active Problem List   Diagnosis Date Noted  . Muscle weakness (generalized) 01/13/2014  . Knee stiffness 01/13/2014  . Gout 12/11/2013  . Anemia 11/19/2013  . Cellulitis 11/19/2013  . Expected blood loss anemia 11/17/2013  . Obese 11/17/2013  . S/P right TKA 11/15/2013  . Anemia, normocytic normochromic 01/12/2013  . Diabetes mellitus 12/10/2012  . 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 Overweight 11/17/2012  . Obstructive sleep apnea 11/15/2012  . Chronic kidney disease, stage 3, cardiorenal syndrome 08/23/2011  . Hypertension        GP    , 01/18/2014, 3:36 PM  

## 2014-01-20 ENCOUNTER — Ambulatory Visit (HOSPITAL_COMMUNITY)
Admission: RE | Admit: 2014-01-20 | Discharge: 2014-01-20 | Disposition: A | Discharge status: Home / Self Care | Payer: Medicare Other | Source: Ambulatory Visit | Attending: Family Medicine | Admitting: Family Medicine

## 2014-01-20 DIAGNOSIS — M6281 Muscle weakness (generalized): Secondary | ICD-10-CM

## 2014-01-20 DIAGNOSIS — M25669 Stiffness of unspecified knee, not elsewhere classified: Secondary | ICD-10-CM

## 2014-01-20 NOTE — Progress Notes (Signed)
Physical Therapy Treatment Patient Details  Name: [name] MRN: [id number] Date of Birth: 1946/12/18  Today's Date: 01/20/2014 Time: 1345-1430 PT Time Calculation (min): 45 min Charges TE [id number]  Visit#: 3 of 12  Re-eval: 02/12/14 Assessment Diagnosis: R TKR  Surgical Date: 11/16/13 Next MD Visit: Dellia Nims / surgeon Dr. Alvan Dame  Prior Therapy: yes inpateint rehab   Authorization: Medicare [place] Time Period:    Authorization Visit#: 3 of 10   Subjective: Symptoms/Limitations Symptoms: doing well, started at [facility] with basic exercises , current pain 1/10 right knee  Pertinent History: right TKR 11/16/13, ankle gout, left kne DJD  Patient Stated Goals: return to [facility] , continue to get stronger, improve sit to stand transfer from chair and in/ot of car   Precautions/Restrictions; hx of gout, heart attack     Exercise/Treatments Mobility/Balance        Stretches   Aerobic Stationary Bike: Nu step level 3 10 min ROM ,strength , verbal cues to attain full knee extension      Standing Heel Raises: 2 sets;10 reps Knee Flexion: Both;1 set;20 reps Hip ADduction: Strengthening;Both;1 set;20 reps Lateral Step Up: Right;20 reps;Hand Hold: 1;Step Height: 4" Forward Step Up: Right;Hand Hold: 1;Step Height: 4";10 reps;Step Height: 6" Step Down: Hand Hold: 1;Step Height: 2";Left;5 reps Functional Squat: 10 reps (sit to stamd fom raised treatment table ) Seated Long Arc Quad: Strengthening;Right;15 reps Supine Short Arc Quad Sets: Strengthening;Right;15 reps          Physical Therapy Assessment and Plan PT Assessment and Plan: strong focus this visit on right quadriceps strengthening through step ups and sit to stand, progressed into step downs  on small step 2inches   PT Plan: focus on quad strength. edema control, HEP progress, functional LE strength, standing ther exercise, nu step, 3 in step ups     sit to stand exercises , LAQ       Goals Home  Exercise Program Pt/caregiver will Perform Home Exercise Program: For increased ROM;For increased strengthening PT Goal: Perform Home Exercise Program - Progress: Progressing toward goal PT Short Term Goals Time to Complete Short Term Goals: 2 weeks PT Short Term Goal 1: improve AROM right knee 0-115  PT Short Term Goal 1 - Progress: Progressing toward goal PT Short Term Goal 2: improve R quad strength to 3+/5 for gait and transfers  PT Short Term Goal 2 - Progress: Progressing toward goal PT Long Term Goals Time to Complete Long Term Goals: 4 weeks PT Long Term Goal 1: pateint able to transfer sit to stand standard chair less than 5 seconds and moderate use of UE for safe transfers at home  PT Long Term Goal 1 - Progress: Progressing toward goal PT Long Term Goal 2: Patient able to ambulate 15 min with cane level terrain  PT Long Term Goal 2 - Progress: Progressing toward goal Long Term Goal 3: patient able to ascend and descend 6 in steps with handrail use comfortably and safely  Long Term Goal 3 Progress: Progressing toward goal  Problem List Patient Active Problem List   Diagnosis Date Noted  . Muscle weakness (generalized) 01/13/2014  . Knee stiffness 01/13/2014  . Gout 12/11/2013  . Anemia 11/19/2013  . Cellulitis 11/19/2013  . Expected blood loss anemia 11/17/2013  . Obese 11/17/2013  . S/P right TKA 11/15/2013  . Anemia, normocytic normochromic 01/12/2013  . Diabetes mellitus 12/10/2012  . 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 Overweight 11/17/2012  . Obstructive sleep  apnea 11/15/2012  . Chronic kidney disease, stage 3, cardiorenal syndrome 08/23/2011  . Hypertension     PT Plan of Care Consulted and Agree with Plan of Care: Patient  GP    Gaelle Adriance 01/20/2014, 3:46 PM

## 2014-01-25 ENCOUNTER — Ambulatory Visit (HOSPITAL_COMMUNITY)
Admission: RE | Admit: 2014-01-25 | Discharge: 2014-01-25 | Disposition: A | Discharge status: Home / Self Care | Payer: Medicare Other | Source: Ambulatory Visit | Attending: Family Medicine | Admitting: Family Medicine

## 2014-01-25 DIAGNOSIS — R269 Unspecified abnormalities of gait and mobility: Secondary | ICD-10-CM | POA: Insufficient documentation

## 2014-01-25 DIAGNOSIS — M25569 Pain in unspecified knee: Secondary | ICD-10-CM | POA: Insufficient documentation

## 2014-01-25 DIAGNOSIS — I10 Essential (primary) hypertension: Secondary | ICD-10-CM | POA: Insufficient documentation

## 2014-01-25 DIAGNOSIS — E119 Type 2 diabetes mellitus without complications: Secondary | ICD-10-CM | POA: Insufficient documentation

## 2014-01-25 DIAGNOSIS — IMO0001 Reserved for inherently not codable concepts without codable children: Secondary | ICD-10-CM | POA: Insufficient documentation

## 2014-01-25 DIAGNOSIS — M6281 Muscle weakness (generalized): Secondary | ICD-10-CM | POA: Insufficient documentation

## 2014-01-25 DIAGNOSIS — M25669 Stiffness of unspecified knee, not elsewhere classified: Secondary | ICD-10-CM

## 2014-01-25 NOTE — Progress Notes (Signed)
Physical Therapy Treatment Patient Details  Name: [name] MRN: [id number] Date of Birth: 16-Aug-1946  Today's Date: 01/25/2014 Time: 1345-1430 PT Time Calculation (min): 45 min 75 - 71   TE  Visit#: 4 of 12  Re-eval:   Assessment Diagnosis: R TKR  Surgical Date: 11/16/13 Next MD Visit: Dellia Nims / surgeon Dr. 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, ankle gout, left kne DJD   Precautions/Restrictions     Exercise/Treatments Mobility/Balance        Stretches   Aerobic Stationary Bike: Nu step level 3 10 min ROM ,strength  (verbal cues for knee extension, mod asssit legs ) Machines for Strengthening   Plyometrics   Standing Heel Raises: 2 sets;10 reps Knee Flexion: Both;1 set;20 reps Hip ADduction: Strengthening;Both;1 set;20 reps;Limitations Hip ADduction Limitations: 2#  Lateral Step Up: Right;20 reps;Hand Hold: 1;Step Height: 4" Forward Step Up: Right;Hand Hold: 1;Step Height: 4";10 reps;Step Height: 6" Functional Squat: 10 reps Wall Squat: Limitations Wall Squat Limitations: with overhead reach to wall upon standing  Gait Training: walking no device around putside of parallel bars 5 laps, using longer steps to step over floor markers/weights  Seated   Supine   Sidelying   Prone         Physical Therapy Assessment and Plan PT Assessment and Plan Clinical Impression Statement: having patient step over floor markers greatly helped patient to increase stride and step lengths, some carry over when floor markers were remvoed, spoke with patient abount use of lymphedeam tx upon consent of his dr to help with B LE edema  PT Frequency: Min 2X/week PT Duration: 6 weeks PT Treatment/Interventions: Gait training;Stair  training;Functional mobility training;Therapeutic activities;Therapeutic exercise;Neuromuscular re-education;Balance training;Patient/family education;Modalities;Manual techniques PT Plan: focus on quad strength. edema control, HEP progress, functional LE strength, standing ther exercise, nu step,  step ups    sit to stand exercises , LAQ       Goals PT Short Term Goals PT Short Term Goal 1: improve AROM right knee 0-115  PT Short Term Goal 1 - Progress: Progressing toward goal PT Short Term Goal 2: improve R quad strength to 3+/5 for gait and transfers  PT Short Term Goal 2 - Progress: Progressing toward goal PT Long Term Goals Time to Complete Long Term Goals: 4 weeks PT Long Term Goal 1: pateint able to transfer sit to stand standard chair less than 5 seconds and moderate use of UE for safe transfers at home  PT Long Term Goal 1 - Progress: Progressing toward goal PT Long Term Goal 2: Patient able to ambulate 15 min with cane level terrain  PT Long Term Goal 2 - Progress: Progressing toward goal Long Term Goal 3: patient able to ascend and descend 6 in steps with handrail use comfortably and safely  Long Term Goal 3 Progress: Progressing toward goal  Problem List Patient Active Problem List   Diagnosis Date Noted  . Muscle weakness (generalized) 01/13/2014  . Knee stiffness 01/13/2014  . Gout 12/11/2013  . Anemia 11/19/2013  . Cellulitis 11/19/2013  . Expected blood loss anemia 11/17/2013  . Obese 11/17/2013  . S/P right TKA 11/15/2013  . Anemia, normocytic normochromic 01/12/2013  . Diabetes mellitus 12/10/2012  . 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 Overweight 11/17/2012  .  Obstructive sleep apnea 11/15/2012  . Chronic kidney disease, stage 3, cardiorenal syndrome 08/23/2011  . Hypertension        GP    Barnett Elzey 01/25/2014, 3:20 PM

## 2014-01-27 ENCOUNTER — Ambulatory Visit (HOSPITAL_COMMUNITY)
Admission: RE | Admit: 2014-01-27 | Discharge: 2014-01-27 | Disposition: A | Discharge status: Home / Self Care | Payer: Medicare Other | Source: Ambulatory Visit | Attending: Family Medicine | Admitting: Family Medicine

## 2014-01-27 DIAGNOSIS — M6281 Muscle weakness (generalized): Secondary | ICD-10-CM

## 2014-01-27 DIAGNOSIS — M25669 Stiffness of unspecified knee, not elsewhere classified: Secondary | ICD-10-CM

## 2014-01-27 NOTE — Progress Notes (Signed)
Physical Therapy Treatment Patient Details  Name: [name] MRN: [id number] Date of Birth: [birth year]  Today's Date: 01/27/2014 Time: 1345-1430 PT Time Calculation (min): 45 min TE 31 - 1430   Visit#: 5 of 12  Re-eval:   Assessment Diagnosis: R TKR  Surgical Date: 11/16/13 Next MD Visit: Dellia Nims / surgeon Dr. 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, ankle gout, left kne DJD  Patient Stated Goals: return [facility] , continue to get stronger, improve sit to stand transfer from chair and in/ot of car   Precautions/Restrictions     Exercise/Treatments Mobility/Balance         Aerobic Stationary Bike: Nu step level 2,8 min  hills for ROM ,strength  Machines for Strengthening      Standing Heel Raises: 2 sets;10 reps Knee Flexion: Both;1 set;20 reps;Limitations Knee Flexion Limitations: 2#  Hip ADduction: Strengthening;Both;1 set;20 reps;Limitations Hip ADduction Limitations: 2#  Lateral Step Up: Right;20 reps;Hand Hold: 1;Step Height: 4" Forward Step Up: Right;Hand Hold: 1;Step Height: 4";10 reps;Step Height: 6" Functional Squat: 10 reps Wall Squat: Limitations Wall Squat Limitations: with overhead reach to wall upon standing  Gait Training: walking no device around outside of parallel bars 5 laps, using longer steps to step over floor markers/weights , then 3 laps high knees marching  Seated Long Arc Quad: Strengthening;Right;15 reps Heel Slides: AROM;10 reps Other Seated Knee Exercises: seated PROM/ knee flexin stretch 5 sec x 5  Supine Short Arc Quad Sets: Strengthening;Right;15 reps        Physical Therapy Assessment and Plan PT Assessment and Plan Clinical Impression Statement: AROM  maintained flexion seated and supine at 110, easily attain120 with gentle overpressure, encouraged to continue therapy at least one to two more weeks, patient feels he is going well, further rehab highly advised per quadriceps strength deficits that affect transfers and step ups. patient is making gains towards all goals  Rehab Potential: Good Clinical Impairments Affecting Rehab Potential: left knee djd, prior use of cane, use of lift chair at home, back pain  PT Plan: focus on quad strength. edema control, HEP progress, functional LE strength, standing ther exercise, nu step,  step ups    sit to stand exercises , LAQ       Goals PT Short Term Goals Time to Complete Short Term Goals: 2 weeks PT Short Term Goal 1: improve AROM right knee 0-115  PT Short Term Goal 1 - Progress: Progressing toward goal PT Short Term Goal 2: improve R quad strength to 3+/5 for gait and transfers  PT Short Term Goal 2 - Progress: Progressing toward goal PT Long Term Goals Time to Complete Long Term Goals: 4 weeks PT Long Term Goal 1: pateint able to transfer sit to stand standard chair less than 5 seconds and moderate use of UE for safe transfers at home  PT Long Term Goal 1 - Progress: Progressing toward goal PT Long Term Goal 2: Patient able to ambulate 15 min with cane level terrain  PT Long Term Goal 2 - Progress: Progressing toward goal Long Term Goal 3: patient able to ascend and descend 6 in steps with handrail use comfortably and safely  Long Term Goal 3 Progress: Progressing toward goal  Problem List  Patient Active Problem List   Diagnosis Date Noted  . Muscle weakness (generalized) 01/13/2014  . Knee stiffness 01/13/2014  . Gout 12/11/2013  . Anemia 11/19/2013  . Cellulitis 11/19/2013  . Expected blood loss anemia 11/17/2013  . Obese 11/17/2013  . S/P right TKA 11/15/2013  . Anemia, normocytic normochromic 01/12/2013  . Diabetes mellitus 12/10/2012  . Arteriosclerotic cardiovascular disease  (ASCVD) 11/24/2012  . Overweight 11/17/2012  . Obstructive sleep apnea 11/15/2012  . Chronic kidney disease, stage 3, cardiorenal syndrome 08/23/2011  . Hypertension        GP    Marquis Diles 01/27/2014, 3:31 PM

## 2014-02-01 ENCOUNTER — Ambulatory Visit (HOSPITAL_COMMUNITY): Payer: Medicare Other

## 2014-02-03 ENCOUNTER — Ambulatory Visit (HOSPITAL_COMMUNITY)
Admission: RE | Admit: 2014-02-03 | Discharge: 2014-02-03 | Disposition: A | Discharge status: Home / Self Care | Payer: Medicare Other | Source: Ambulatory Visit | Attending: Family Medicine | Admitting: Family Medicine

## 2014-02-03 DIAGNOSIS — M25669 Stiffness of unspecified knee, not elsewhere classified: Secondary | ICD-10-CM

## 2014-02-03 DIAGNOSIS — M6281 Muscle weakness (generalized): Secondary | ICD-10-CM

## 2014-02-03 NOTE — Progress Notes (Signed)
Physical Therapy Treatment Patient Details  Name: [name] MRN: [id number] Date of Birth: December 12, 1946  Today's Date: 02/03/2014 Time: 7943-2761 PT Time Calculation (min): 74 min Charge:  Manual 1350-1425; there ex 1425-1440 Visit#: 6 of 12  Re-eval: 02/12/14    Subjective: Symptoms/Limitations Symptoms: Pt states that his MD did not say anything about the wrapping but he did tell him to double up on his fluid pills which he has; pt states significant increased urination since increasing his fluid pill as well as decreased swelling.  Pt is going to the [facility] stating that he works out about an hour each time he goes .  At end of session pt states that what he is having the most problems with is coming sit to stand especially if the seat he is sitting in does not have arms to push up from. 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    Physical Therapy Assessment and Plan PT Assessment and Plan Clinical Impression Statement: Pt with edema in B LE Rt greater than Lt; Rt volume 5898.98; Lt [id number].  Pt recived manual decongestive techniques followed by application of lotion to B LE.  Pt given the size of short stretch bandages that he needs for multilayer short stretch bandaging.  Pt is only able to afford one set at this time therefore once he purchases we will begin wrapping his Rt LE.  Pt completed abbreviated session of strengthening due to time  restraints,(pt will need two time slots one for MLD the other for strengthening.) Rehab Potential: Good PT Plan: Begin sit to stand activity next treatnent; as well as concentration on quad strengthening.  Begin multilayer short stretch bandaging to decrease edema.    Goals  progressing  Problem List Patient Active Problem List   Diagnosis Date Noted  . Muscle weakness (generalized) 01/13/2014  . Knee stiffness 01/13/2014  . Gout 12/11/2013  . Anemia 11/19/2013  . Cellulitis 11/19/2013  . Expected blood loss anemia 11/17/2013  . Obese 11/17/2013  . S/P right TKA 11/15/2013  . Anemia, normocytic normochromic 01/12/2013  . Diabetes mellitus 12/10/2012  . 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 Overweight 11/17/2012  . Obstructive sleep apnea 11/15/2012  . Chronic kidney disease, stage 3, cardiorenal syndrome 08/23/2011  . Hypertension        GP    Hensley Aziz,CINDY 02/03/2014, 4:12 PM

## 2014-02-08 ENCOUNTER — Inpatient Hospital Stay (HOSPITAL_COMMUNITY): Admission: RE | Admit: 2014-02-08 | Payer: Medicare Other | Source: Ambulatory Visit

## 2014-02-10 ENCOUNTER — Ambulatory Visit (HOSPITAL_COMMUNITY)
Admission: RE | Admit: 2014-02-10 | Discharge: 2014-02-10 | Disposition: A | Discharge status: Home / Self Care | Payer: Medicare Other | Source: Ambulatory Visit | Attending: Family Medicine | Admitting: Family Medicine

## 2014-02-10 NOTE — Progress Notes (Addendum)
Physical Therapy Treatment Patient Details  Name: [name] MRN: [id number] Date of Birth: January 07, 1946  Today's Date: 02/10/2014 Time: 1310-1435 PT Time Calculation (min): 85 min  Visit#: 7 of 12  Re-eval: 02/12/14 Authorization: Medicare [employer]   Authorization Visit#: 7 of 10  Charges:  self care 2620-3559 (30'), manual 7416-3845 (10'), therex 1402-1435 (33')   Subjective: Symptoms/Limitations Symptoms: Pt states he went to [facility] but did not get the bandages.  Pt reports he has been going to the [facility] 3X week for an hour.  States his swelling has went down alot by doubling his fluid pill (per MD orders). Pain Assessment Currently in Pain?: No/denies   Exercise/Treatments Aerobic Stationary Bike: Nustep L8, hills #1 10:00 LE only Standing Heel Raises: 10 reps;Limitations Heel Raises Limitations: toeraises 10 reps Lateral Step Up: Right;15 reps;Hand Hold: 1;Step Height: 4" Forward Step Up: Right;Hand Hold: 1;Step Height: 4";10 reps;Step Height: 6" Supine Quad Sets: 15 reps Short Arc Quad Sets: 10 reps;Limitations Short Arc Quad Sets Limitations: 4# Heel Slides: 15 reps Straight Leg Raises: 10 reps;2 sets    Manual Therapy Other Manual Therapy: Circumferential measurement of Bilateral LE's MTP to knee for volume/decompression.  No decongestive therapy performed today per patient request.  Circumferential measurements of Bilateral LE's in cm: Date 02/03/2014  02/10/2014    Right Left Right Left  MCP 30.20 38.80 29 27.3  WRIST 41.4 39.6 38.20 37.30  4cm 33.00 33.10 31.00 31.00  8cm 35.10 34.50 32.00 30.50  12 cm 37.30 36.30 34.00 32.00  16cm 39.60 38.00 35.70 33.80  20cm 42.60 40.00 38.50 35.20  24cm 45.40 41.60 41.40 37.50  28cm 47.40 42.50 43.20 39.80  32cm 47.60 43.50 43.70 41.00  36cm 47.30 44.00 44.00 41.80        Sum of squares 18532.19 17086.21 15623.87 13851.84  Total Volume 5898.981 5438.712 3646.803 4409.179    Physical Therapy Assessment and  Plan Assessment:   Bilateral LE's measured today.  Overall reduction of fluid Rt:925.75 cc; Lt: 1,029.53 cc since measurement on 02/03/14.  Rt LE is now 564 cc larger than Lt LE.  Pt no longer with induration in Bil LE's, however continues to have most swelling around ankles.   Pt with questions and misunderstandings regarding compression garments/bandages.  Discussed lymphedema treatment, supplies and general commitment to program at length with patient today.  Sarajane Marek, PT present during discussion.  Pt.  Reports he is overall happy with the reduction in his LE's due to increasing his diuretic meds and is now able to wear his shoes normally without the heaviness in his LE's.  Pt states he would not be compliant with stocking use due to his back and being able to get stockings on.  Discussed and demonstrated alternative ways of donning stockings using aides or garments with velcro.  Pt not interested at this point with the expense and commitment.  Encouraged pt to purchase OTC compression stockings to wear when ambulatory or going the [facility] to increase circulation and decrease edema.  Pt verbalized understanding, however unsure if he will purchase these due to limited income.  Pt requests to continue with general strengthening of his LE's at this point while discharging decompression therapy. Plan:  Continue to progress Rt LE strength focusing on eccentric strengthening and functional activities. Discharge decompression therapy at this time per patient request. Continue per plan 5 more visits    Problem List Patient Active Problem List   Diagnosis Date Noted  . Muscle weakness (generalized) 01/13/2014  .  Knee stiffness 01/13/2014  . Gout 12/11/2013  . Anemia 11/19/2013  . Cellulitis 11/19/2013  . Expected blood loss anemia 11/17/2013  . Obese 11/17/2013  . S/P right TKA 11/15/2013  . Anemia, normocytic normochromic 01/12/2013  . Diabetes mellitus 12/10/2012  . Arteriosclerotic cardiovascular  disease (ASCVD) 11/24/2012  . Overweight 11/17/2012  . Obstructive sleep apnea 11/15/2012  . Chronic kidney disease, stage 3, cardiorenal syndrome 08/23/2011  . Hypertension           Teena Irani, PTA/CLT 02/10/2014, 2:47 PM

## 2014-02-14 ENCOUNTER — Encounter: Payer: Self-pay | Admitting: Internal Medicine

## 2014-02-14 ENCOUNTER — Ambulatory Visit (INDEPENDENT_AMBULATORY_CARE_PROVIDER_SITE_OTHER): Payer: Medicare Other | Admitting: Internal Medicine

## 2014-02-14 ENCOUNTER — Ambulatory Visit (HOSPITAL_COMMUNITY): Payer: Medicare Other | Admitting: Physical Therapy

## 2014-02-14 VITALS — BP 136/64 | HR 54 | Temp 98.0°F | Wt 288.2 lb

## 2014-02-14 DIAGNOSIS — D126 Benign neoplasm of colon, unspecified: Secondary | ICD-10-CM

## 2014-02-14 DIAGNOSIS — K922 Gastrointestinal hemorrhage, unspecified: Secondary | ICD-10-CM

## 2014-02-14 NOTE — Progress Notes (Signed)
Primary Care Physician:  [name], MD Primary Gastroenterologist:  Dr. Gala Romney  Pre-Procedure History & Physical: HPI:  [name] is a 68 y.o. male here for a followup. Evaluated in December 2013 with GI bleed. Have not H. pylori gastritis tubular adenoma and ischemic colitis and colonoscopy. He will be due for followup colonoscopy in 2019. He is now off anticoagulation therapy. Recent right knee replacement. He is doing very well at this point in time although he is reported still have an anemia hasn't had any melena hematochezia hematemesis GERD, nausea, vomiting or abdominal pain. Was in the [facility] for rehabilitation; now home..  Past Medical History  Diagnosis Date  . Diastolic heart failure     Pedal edema, "a long time ago"  . Hypertension     Exercise induced  . NSVT (nonsustained ventricular tachycardia)     Exercise induced  . 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 Obesity   . Degenerative joint disease   . Benign prostatic hypertrophy     Nocturia  . Erectile dysfunction   . High cholesterol   . CAD (coronary artery disease)     a.  NSTEMI 10/2012 s/p DES to LAD & DES to 1st diagonal with residual diffuse nonobstructive dz in LCx/RCA   . Chronic kidney disease, stage 3, mod decreased GFR     Creatinine of 1.5 in 03/2009  . Diabetes mellitus, type II 12/10/2012.  Marland Kitchen Anxiety   . Depression     hx of  . Flu 2013    hx of  . Hemorrhoids   . Tubular adenoma 2014    Past Surgical History  Procedure Laterality Date  . Cardiac catheterization      with stent placement  . Back surgery  2007    x2  . Hernia repair    . Total knee arthroplasty Right 11/15/2013    Procedure: RIGHT TOTAL KNEE ARTHROPLASTY;  Surgeon: Mauri Pole, MD;  Location: [facility];  Service: Orthopedics;  Laterality: Right;  . Colonoscopy with esophagogastroduodenoscopy (egd) N/A 11/23/2013    Dr. Oneida Alar- TCS= left sided colitis and mild proctitis, moderate internal  hemorrhoids, small nodule in the rectum, bx=tubular adenoma, EGD=-mild non-erosive gastritis    Prior to Admission medications   Medication Sig Start Date End Date Taking? Authorizing Provider  acetaminophen (TYLENOL) 325 MG tablet Take 1-2 tablets (325-650 mg total) by mouth every 6 (six) hours as needed. 11/17/13   Lucille Passy Babish, PA-C  allopurinol (ZYLOPRIM) 300 MG tablet Take 300 mg by mouth daily.    Historical Provider, MD  ALPRAZolam Duanne Moron) 0.5 MG tablet Take one tablet by mouth twice daily as needed for anxiety 11/24/13   Pricilla Larsson, NP  amLODipine (NORVASC) 10 MG tablet Take 10 mg by mouth daily.    Historical Provider, MD  docusate sodium 100 MG CAPS Take 100 mg by mouth 2 (two) times daily. 11/17/13   Lucille Passy Babish, PA-C  ferrous sulfate 325 (65 FE) MG tablet Take 1 tablet (325 mg total) by mouth 3 (three) times daily after meals. 11/17/13   Lucille Passy Babish, PA-C  furosemide (LASIX) 40 MG tablet Take 40 mg by mouth daily.  02/08/13   Yehuda Savannah, MD  gabapentin (NEURONTIN) 300 MG capsule Take 300 mg by mouth 2 (two) times daily.     Historical Provider, MD  metoprolol (LOPRESSOR) 50 MG tablet Take 50 mg by mouth 2 (two) times daily.  Historical Provider, MD  NITROSTAT 0.4 MG SL tablet Place 0.4 mg under the tongue every 5 (five) minutes as needed for chest pain.  06/12/13   Historical Provider, MD  ONGLYZA 5 MG TABS tablet Take 5 mg by mouth daily.  02/09/13   Historical Provider, MD  oxyCODONE (OXY IR/ROXICODONE) 5 MG immediate release tablet Take one to three tablets by  Mouth every 4 hours as needed for severe pain 11/24/13   Pricilla Larsson, NP  pantoprazole (PROTONIX) 40 MG tablet Take 1 tablet (40 mg total) by mouth daily before breakfast. 11/24/13   Donne Hazel, MD  polyethylene glycol (MIRALAX / Floria Raveling) packet Take 17 g by mouth 2 (two) times daily. 11/17/13   Lucille Passy Babish, PA-C  pravastatin (PRAVACHOL) 20 MG tablet Take 20 mg by mouth at  bedtime.     Historical Provider, MD    Allergies as of 02/14/2014 - Review Complete 12/28/2013  Allergen Reaction Noted  . Niacin and related Other (See Comments) 10/24/2012    Family History  Problem Relation Age of Onset  . Hypertension Other   . Cancer Other   . Heart disease    . Diabetes    . Colon cancer Neg Hx     History   Social History  . Marital Status: Widowed    Spouse Name: N/A    Number of Children: N/A  . Years of Education: N/A   Occupational History  . Disabled    Social History Main Topics  . Smoking status: Former Smoker -- 1.00 packs/day for 30 years    Types: Cigarettes    Quit date: 12/23/1992  . Smokeless tobacco: Never Used  . Alcohol Use: No  . Drug Use: No  . Sexual Activity: Not on file   Other Topics Concern  . Not on file   Social History Narrative   Married    Review of Systems: See HPI, otherwise negative ROS  Physical Exam: There were no vitals taken for this visit. General:   Alert,  Well-developed, well-nourished, obese. Ambulates with a cane. Needed to be examined in the chair. Skin:  Intact without significant lesions or rashes. Eyes:  Sclera clear, no icterus.   Conjunctiva pink. Ears:  Normal auditory acuity. Nose:  No deformity, discharge,  or lesions. Mouth:  No deformity or lesions. Neck:  Supple; no masses or thyromegaly. No significant cervical adenopathy. Lungs:  Clear throughout to auscultation.   No wheezes, crackles, or rhonchi. No acute distress. Heart:  Regular rate and rhythm; no murmurs, clicks, rubs,  or gallops. Abdomen: Obese, normal bowel sounds.  Soft and nontender without appreciable mass or hepatosplenomegaly.  Pulses:  Normal pulses noted. Extremities:  Without clubbing or edema.  Impression:   Pleasant 68 year old, recent right knee replacement.  Suffered a GI bleed while on anticoagulation. Found to have ischemic colitis and tubular adenomas removed. Clinically, doing very well at this time.  He tells me he had laboratory done at Dr. Kendrick Ranch office last week. Would like to receive copies for review.   Recommendations:  Plan on a repeat colonoscopy in 5 years - history of polyps  Will review Dr. Kendrick Ranch lab reports from last week  Office visit with [place] in 6 months  Further recommendations to follow

## 2014-02-14 NOTE — Patient Instructions (Addendum)
Plan on a repeat colonoscopy in 5 years - history of polyps  Will review Dr. Kendrick Ranch lab reports from last week  Office visit with [place] in 6 months  Further recommendations to follow

## 2014-02-15 ENCOUNTER — Ambulatory Visit (HOSPITAL_COMMUNITY)
Admission: RE | Admit: 2014-02-15 | Discharge: 2014-02-15 | Disposition: A | Discharge status: Home / Self Care | Payer: Medicare Other | Source: Ambulatory Visit

## 2014-02-15 DIAGNOSIS — M6281 Muscle weakness (generalized): Secondary | ICD-10-CM

## 2014-02-15 DIAGNOSIS — M25669 Stiffness of unspecified knee, not elsewhere classified: Secondary | ICD-10-CM

## 2014-02-15 NOTE — Progress Notes (Signed)
Physical Therapy Treatment Patient Details  Name: [name] MRN: [id number] Date of Birth: 1946-05-31  Today's Date: 02/15/2014 Time: 3557-3220 PT Time Calculation (min): 43 min Charges: Therex x 38'  Visit#: 8 of 12  Re-eval: 02/12/14  Authorization: Medicare U[employer]   Authorization Visit#: 8 of 10   Subjective: Symptoms/Limitations Symptoms: Pt states taht he has been working on strengthening his knee at [employer]. He took a break today secondary to soreness. Pain Assessment Currently in Pain?: No/denies   Exercise/Treatments  Machines for Strengthening Cybex Knee Extension: 1.5pl x 10 RLE Cybex Knee Flexion: 3pl x 10 RLE Standing Heel Raises: 10 reps;Limitations Heel Raises Limitations: Toe raises x 10 Knee Flexion: 15 reps;Right Seated Other Seated Knee Exercises: Sit to stand x 10   Physical Therapy Assessment and Plan PT Assessment and Plan Clinical Impression Statement: Pt requires multimodal cueing with each exercise to complete properly. Began sit to stand from elevated surface to improve functional strength and safety. Pt requires vc's to improve hip and knee flexion and dorsiflexion with ambulation. PT Plan: Continue to progress LE functional strength and gait mechanics per PT POC.     Problem List Patient Active Problem List   Diagnosis Date Noted  . Muscle weakness (generalized) 01/13/2014  . Knee stiffness 01/13/2014  . Gout 12/11/2013  . Anemia 11/19/2013  . Cellulitis 11/19/2013  . Expected blood loss anemia 11/17/2013  . Obese 11/17/2013  . S/P right TKA 11/15/2013  . Anemia, normocytic normochromic 01/12/2013  . Diabetes mellitus 12/10/2012  . 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 Overweight 11/17/2012  . Obstructive sleep apnea 11/15/2012  . Chronic kidney disease, stage 3, cardiorenal syndrome 08/23/2011  . Hypertension     PT - End of Session Activity Tolerance: Patient tolerated treatment  well General Behavior During Therapy: [facility] for tasks assessed/performed  Rachelle Hora, PTA  02/15/2014, 3:18 PM

## 2014-02-16 ENCOUNTER — Ambulatory Visit (HOSPITAL_COMMUNITY): Payer: Medicare Other | Admitting: Physical Therapy

## 2014-02-17 ENCOUNTER — Ambulatory Visit (HOSPITAL_COMMUNITY): Payer: Medicare Other | Admitting: *Deleted

## 2014-02-22 ENCOUNTER — Ambulatory Visit (HOSPITAL_COMMUNITY)
Admission: RE | Admit: 2014-02-22 | Discharge: 2014-02-22 | Disposition: A | Discharge status: Home / Self Care | Payer: Medicare Other | Source: Ambulatory Visit | Attending: Internal Medicine | Admitting: Internal Medicine

## 2014-02-22 DIAGNOSIS — M6281 Muscle weakness (generalized): Secondary | ICD-10-CM | POA: Insufficient documentation

## 2014-02-22 DIAGNOSIS — IMO0001 Reserved for inherently not codable concepts without codable children: Secondary | ICD-10-CM | POA: Insufficient documentation

## 2014-02-22 DIAGNOSIS — E119 Type 2 diabetes mellitus without complications: Secondary | ICD-10-CM | POA: Insufficient documentation

## 2014-02-22 DIAGNOSIS — M25569 Pain in unspecified knee: Secondary | ICD-10-CM | POA: Insufficient documentation

## 2014-02-22 DIAGNOSIS — M25669 Stiffness of unspecified knee, not elsewhere classified: Secondary | ICD-10-CM | POA: Insufficient documentation

## 2014-02-22 DIAGNOSIS — R269 Unspecified abnormalities of gait and mobility: Secondary | ICD-10-CM | POA: Insufficient documentation

## 2014-02-22 DIAGNOSIS — I10 Essential (primary) hypertension: Secondary | ICD-10-CM | POA: Insufficient documentation

## 2014-02-22 NOTE — Progress Notes (Signed)
Physical Therapy Re-evaluation / Discharge  Patient Details  Name: [name] MRN: [id number] Date of Birth: Apr 24, 1946  Today's Date: 02/22/2014 Time: 1305-1405 PT Time Calculation (min): 60 min              Visit#: 9 of 12  Diagnosis: R TKR  Surgical Date: 11/16/13 Next MD Visit: Dellia Nims / surgeon Dr. Alvan Dame  Prior Therapy: yes inpatient rehab  Authorization: Medicare UHC     Authorization Visit#: 9 of 10    Subjective Pt states he goes to the [employer] at least 4X week.  States his back bothers him more than anything else and prevents him from increasing his functional mobility. Pt reports he intends to have his Lt knee replaced towards the end of the year.  Objective: Balance Screening Balance Screen Has the patient fallen in the past 6 months: No  RLE AROM (degrees) Right Knee Extension: 5 (was lacking 2 degrees) Right Knee Flexion: 115 (was 112 degrees) RLE Strength Right Knee Extension: 4/5 (was 3/5)  Exercise/Treatments Aerobic Stationary Bike: Nustep L4, hills #1 10:00 LE only Seated Other Seated Knee Exercises: Sit to stand x 10    Physical Therapy Assessment and Plan PT Assessment and Plan Clinical Impression Statement: Pt has completed 9 PT visits for Rt TKR surgery.  Pt had previous therapy at [facility].  Pt with overall improvement in Rt knee ROM and strength.  Pt has met 2/3 STG's  and 2/3 LTG's.  Pt is attending [employer] independently 4X week and feels he is ready for discharge to HEP.  Pt instructed to continue practicing sit to stand without UE use to increase LE strength as well as practicing stairs.  Pt verbalized understanding of HEP instructions. PT Plan: Discharge To HEP.    Goals Time to Complete Short Term Goals: 2 weeks PT Goal: Perform Home Exercise Program - Progress: Met PT Short Term Goal 1: improve AROM right knee 0-115 - Progress: Partly met PT Short Term Goal 2: improve R quad strength to 3+/5 for gait and transfers - Progress:  Met  PT Long Term Goals Time to Complete Long Term Goals: 4 weeks PT Long Term Goal 1: pateint able to transfer sit to stand standard chair less than 5 seconds and moderate use of UE for safe transfers at home- Progress: Met PT Long Term Goal 2: Patient able to ambulate 15 min with cane level terrain - Progress: Met Long Term Goal 3: patient able to ascend and descend 6 in steps with handrail use comfortably and safely-Progress: Partly met  Problem List Patient Active Problem List   Diagnosis Date Noted  . Muscle weakness (generalized) 01/13/2014  . Knee stiffness 01/13/2014  . Gout 12/11/2013  . Anemia 11/19/2013  . Cellulitis 11/19/2013  . Expected blood loss anemia 11/17/2013  . Obese 11/17/2013  . S/P right TKA 11/15/2013  . Anemia, normocytic normochromic 01/12/2013  . Diabetes mellitus 12/10/2012  . 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 Overweight 11/17/2012  . Obstructive sleep apnea 11/15/2012  . Chronic kidney disease, stage 3, cardiorenal syndrome 08/23/2011  . Hypertension     PT - End of Session Activity Tolerance: Patient tolerated treatment well General Behavior During Therapy: WFL for tasks assessed/performed  GP Functional Assessment Tool Used: FOTO mobility  initial CK, goal CJ Mobility: Walking and Moving Around Goal Status (E7614): At least 20 percent but less than 40 percent impaired, limited or restricted Other PT Secondary Goal Status (J0929): At least 20 percent but less  than 40 percent impaired, limited or restricted (cj)  Teena Irani, PTA/CLT 02/22/2014, 2:14 PM

## 2014-02-24 ENCOUNTER — Ambulatory Visit (HOSPITAL_COMMUNITY): Payer: Medicare Other | Admitting: Physical Therapy

## 2014-03-01 ENCOUNTER — Ambulatory Visit (HOSPITAL_COMMUNITY): Payer: Medicare Other

## 2014-03-03 ENCOUNTER — Ambulatory Visit (HOSPITAL_COMMUNITY): Payer: Medicare Other | Admitting: Physical Therapy

## 2014-03-08 NOTE — Progress Notes (Signed)
-  No show-  Derwood Becraft

## 2014-03-09 ENCOUNTER — Ambulatory Visit (HOSPITAL_COMMUNITY): Payer: Medicare Other | Admitting: Oncology

## 2014-04-13 ENCOUNTER — Other Ambulatory Visit (HOSPITAL_COMMUNITY): Payer: Self-pay | Admitting: Cardiovascular Disease

## 2014-05-19 ENCOUNTER — Telehealth: Payer: Self-pay | Admitting: *Deleted

## 2014-05-19 NOTE — Telephone Encounter (Signed)
Can remain on 40 mg daily of Lasix, but can take an extra 20 mg as needed for worsening leg swelling or SOB.

## 2014-05-19 NOTE — Telephone Encounter (Signed)
FYI: Family told pt that his ankles were swollen. Pt states he is up during the day and also goes to the [facility] to exercise. I made pt aware to rest during the day and prop his feet up. Pt takes 40 mg of lasix daily, but advised me that he took 60 mg today. Pt has no redness, pain, or SOB.

## 2014-05-19 NOTE — Telephone Encounter (Signed)
Pt made aware

## 2014-05-19 NOTE — Telephone Encounter (Signed)
Pt states that his feet are swelling during the day. When he wakes up they have gone done. He states that he is on a fluid pill

## 2014-06-13 ENCOUNTER — Encounter (HOSPITAL_COMMUNITY): Payer: Self-pay | Admitting: Oncology

## 2014-10-10 ENCOUNTER — Ambulatory Visit (INDEPENDENT_AMBULATORY_CARE_PROVIDER_SITE_OTHER): Payer: Medicare Other | Admitting: Cardiovascular Disease

## 2014-10-10 ENCOUNTER — Encounter: Payer: Self-pay | Admitting: Cardiovascular Disease

## 2014-10-10 VITALS — BP 128/68 | Ht 75.0 in | Wt 260.0 lb

## 2014-10-10 DIAGNOSIS — I440 Atrioventricular block, first degree: Secondary | ICD-10-CM

## 2014-10-10 DIAGNOSIS — E785 Hyperlipidemia, unspecified: Secondary | ICD-10-CM

## 2014-10-10 DIAGNOSIS — I10 Essential (primary) hypertension: Secondary | ICD-10-CM

## 2014-10-10 DIAGNOSIS — I2510 Atherosclerotic heart disease of native coronary artery without angina pectoris: Secondary | ICD-10-CM

## 2014-10-10 DIAGNOSIS — N521 Erectile dysfunction due to diseases classified elsewhere: Secondary | ICD-10-CM

## 2014-10-10 DIAGNOSIS — I5032 Chronic diastolic (congestive) heart failure: Secondary | ICD-10-CM

## 2014-10-10 NOTE — Progress Notes (Signed)
Patient ID: [name], male   DOB: Aug 21, 1946, 68 y.o.   MRN: [id number]      SUBJECTIVE: [name] is a 68 yr old gentleman with long-standing hypertension and hypertensive heart disease as well as coronary artery disease, and experienced a NSTEMI on 11/16/2012 and received DES to the LAD and D1, with residual diffuse nonobstructive disease in the RCA and LCx. He also has CKD, sleep apnea, obesity, and reportedly also has chronic diastolic heart failure. He has been doing very well and exercises four days a week. He bikes approximately 6 miles and also uses the elliptical. He denies chest pain, shortness of breath, leg swelling, orthopnea, paroxysmal nocturnal dyspnea, lightheadedness, dizziness and syncope. ECG performed in the office today demonstrates sinus bradycardia with first degree AV block, PR interval 272 ms, heart rate 49 beats per minute, and LVH with repolarization abnormalities, with no significant changes from ECG dated 10/19/13.   Review of Systems: As per "subjective", otherwise negative.  Allergies  Allergen Reactions  . Niacin And Related Other (See Comments)    Reaction: flushing and burning side effects even with extended release    Current Outpatient Prescriptions  Medication Sig Dispense Refill  . acetaminophen (TYLENOL) 325 MG tablet Take 1-2 tablets (325-650 mg total) by mouth every 6 (six) hours as needed.      Marland Kitchen allopurinol (ZYLOPRIM) 300 MG tablet Take 300 mg by mouth daily.      Marland Kitchen ALPRAZolam (XANAX) 0.5 MG tablet Take one tablet by mouth twice daily as needed for anxiety  60 tablet  5  . amLODipine (NORVASC) 10 MG tablet Take 10 mg by mouth daily.      Marland Kitchen aspirin 81 MG tablet Take 81 mg by mouth daily.      Marland Kitchen COLCRYS 0.6 MG tablet Take 0.6 mg by mouth daily.       Marland Kitchen docusate sodium 100 MG CAPS Take 100 mg by mouth 2 (two) times daily.  10 capsule  0  . ferrous sulfate 325 (65 FE) MG tablet Take 1 tablet (325 mg total) by mouth 3 (three) times daily after  meals.    3  . furosemide (LASIX) 40 MG tablet Take 40 mg by mouth daily.       Marland Kitchen gabapentin (NEURONTIN) 300 MG capsule Take 300 mg by mouth 2 (two) times daily.       Marland Kitchen HYDROcodone-acetaminophen (NORCO) 10-325 MG per tablet       . metoprolol (LOPRESSOR) 50 MG tablet Take 50 mg by mouth 2 (two) times daily.      Marland Kitchen NITROSTAT 0.4 MG SL tablet DISSOLVE ONE TABLET UNDER THE TONGUE AS NEEDED FOR CHEST PAIN.  25 tablet  1  . omeprazole (PRILOSEC) 20 MG capsule Take 20 mg by mouth daily.       . ONGLYZA 5 MG TABS tablet Take 5 mg by mouth daily.       Marland Kitchen oxyCODONE (OXY IR/ROXICODONE) 5 MG immediate release tablet Take one to three tablets by  Mouth every 4 hours as needed for severe pain  360 tablet  0  . pantoprazole (PROTONIX) 40 MG tablet Take 1 tablet (40 mg total) by mouth daily before breakfast.  30 tablet  0  . polyethylene glycol (MIRALAX / GLYCOLAX) packet Take 17 g by mouth 2 (two) times daily.  14 each  0  . pravastatin (PRAVACHOL) 20 MG tablet Take 20 mg by mouth at bedtime.        No current facility-administered  medications for this visit.    Past Medical History  Diagnosis Date  . Diastolic heart failure     Pedal edema, "a long time ago"  . Hypertension     Exercise induced  . NSVT (nonsustained ventricular tachycardia)     Exercise induced  . 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 Obesity   . Degenerative joint disease   . Benign prostatic hypertrophy     Nocturia  . Erectile dysfunction   . High cholesterol   . CAD (coronary artery disease)     a.  NSTEMI 10/2012 s/p DES to LAD & DES to 1st diagonal with residual diffuse nonobstructive dz in LCx/RCA   . Chronic kidney disease, stage 3, mod decreased GFR     Creatinine of 1.5 in 03/2009  . Diabetes mellitus, type II 12/10/2012.  Marland Kitchen Anxiety   . Depression     hx of  . Flu 2013    hx of  . Hemorrhoids   . Tubular adenoma 2014    Past Surgical History  Procedure Laterality Date  . Cardiac  catheterization      with stent placement  . Back surgery  2007    x2  . Hernia repair    . Total knee arthroplasty Right 11/15/2013    Procedure: RIGHT TOTAL KNEE ARTHROPLASTY;  Surgeon: Mauri Pole, MD;  Location: [facility];  Service: Orthopedics;  Laterality: Right;  . Colonoscopy with esophagogastroduodenoscopy (egd) N/A 11/23/2013    Dr. Oneida Alar- TCS= left sided colitis and mild proctitis, moderate internal hemorrhoids, small nodule in the rectum, bx=tubular adenoma, EGD=-mild non-erosive gastritis    History   Social History  . Marital Status: Widowed    Spouse Name: N/A    Number of Children: N/A  . Years of Education: N/A   Occupational History  . Disabled    Social History Main Topics  . Smoking status: Former Smoker -- 1.00 packs/day for 30 years    Types: Cigarettes    Quit date: 12/23/1992  . Smokeless tobacco: Never Used  . Alcohol Use: No  . Drug Use: No  . Sexual Activity: Not on file   Other Topics Concern  . Not on file   Social History Narrative   Married     Filed Vitals:   10/10/14 1600  BP: 128/68  Height: 6' 3"  (1.905 m)  Weight: 260 lb (117.935 kg)    PHYSICAL EXAM General: NAD HEENT: Normal. Neck: No JVD, no thyromegaly. Lungs: Clear to auscultation bilaterally with normal respiratory effort. CV: Nondisplaced PMI.  Bradycardic, regular rhythm, normal S1/S2, no S3/S4, no murmur. No pretibial or periankle edema.  No carotid bruit.  Normal pedal pulses.  Abdomen: Soft, nontender, no hepatosplenomegaly, no distention.  Neurologic: Alert and oriented x 3.  Psych: Normal affect. Skin: Normal. Musculoskeletal: Normal range of motion, no gross deformities. Extremities: No clubbing or cyanosis.   ECG: Most recent ECG reviewed.      ASSESSMENT AND PLAN: 1. CAD s/p PCI of LAD and D1: Stable ischemic heart disease. Continue aspirin 81 mg, metoprolol 50 mg twice daily, and pravastatin 20 mg daily. 2. Essential HTN: Well controlled on current  therapy which includes amlodipine 10 mg daily. No changes to therapy. 3. Hyperlipidemia: Will obtain copy of lipids from PCP's office. Continue pravastatin 20 mg.  4. Chronic diastolic heart failure: Euvolemic and compensated. No changes in diuretic regimen which includes Lasix 40 mg daily. 5. Erectile dysfunction:  As per his request, I will prescribe Viagra. Instructed not to be taken with nitrates. 6. First degree AV block and bradycardia: He is entirely asymptomatic. Will continue metoprolol at current dose.  Dispo: f/u 1 year.  Kate Sable, M.D., F.A.C.C.

## 2014-10-10 NOTE — Patient Instructions (Signed)
Your physician wants you to follow-up in: 1 year You will receive a reminder letter in the mail two months in advance. If you don't receive a letter, please call our office to schedule the follow-up appointment.  Your physician recommends that you continue on your current medications as directed. Please refer to the Current Medication list given to you today.     Thank you for choosing [facility] !

## 2014-10-24 ENCOUNTER — Encounter: Payer: Self-pay | Admitting: Cardiovascular Disease

## 2014-12-01 ENCOUNTER — Encounter (HOSPITAL_COMMUNITY): Payer: Self-pay | Admitting: Cardiovascular Disease

## 2015-01-19 DIAGNOSIS — Z6835 Body mass index (BMI) 35.0-35.9, adult: Secondary | ICD-10-CM | POA: Diagnosis not present

## 2015-01-19 DIAGNOSIS — E6609 Other obesity due to excess calories: Secondary | ICD-10-CM | POA: Diagnosis not present

## 2015-01-19 DIAGNOSIS — T148 Other injury of unspecified body region: Secondary | ICD-10-CM | POA: Diagnosis not present

## 2015-01-23 DIAGNOSIS — E119 Type 2 diabetes mellitus without complications: Secondary | ICD-10-CM | POA: Diagnosis not present

## 2015-01-23 DIAGNOSIS — E1359 Other specified diabetes mellitus with other circulatory complications: Secondary | ICD-10-CM | POA: Diagnosis not present

## 2015-01-31 DIAGNOSIS — M25562 Pain in left knee: Secondary | ICD-10-CM | POA: Diagnosis not present

## 2015-01-31 DIAGNOSIS — Z471 Aftercare following joint replacement surgery: Secondary | ICD-10-CM | POA: Diagnosis not present

## 2015-01-31 DIAGNOSIS — Z96651 Presence of right artificial knee joint: Secondary | ICD-10-CM | POA: Diagnosis not present

## 2015-02-07 ENCOUNTER — Encounter: Payer: Self-pay | Admitting: Cardiovascular Disease

## 2015-02-07 ENCOUNTER — Ambulatory Visit (INDEPENDENT_AMBULATORY_CARE_PROVIDER_SITE_OTHER): Payer: Self-pay | Admitting: Cardiovascular Disease

## 2015-02-07 ENCOUNTER — Encounter: Payer: Self-pay | Admitting: *Deleted

## 2015-02-07 VITALS — BP 152/72 | HR 55 | Ht 75.0 in | Wt 265.0 lb

## 2015-02-07 DIAGNOSIS — I10 Essential (primary) hypertension: Secondary | ICD-10-CM

## 2015-02-07 DIAGNOSIS — I2510 Atherosclerotic heart disease of native coronary artery without angina pectoris: Secondary | ICD-10-CM

## 2015-02-07 DIAGNOSIS — Z01818 Encounter for other preprocedural examination: Secondary | ICD-10-CM

## 2015-02-07 DIAGNOSIS — I440 Atrioventricular block, first degree: Secondary | ICD-10-CM

## 2015-02-07 DIAGNOSIS — I5032 Chronic diastolic (congestive) heart failure: Secondary | ICD-10-CM

## 2015-02-07 DIAGNOSIS — Z955 Presence of coronary angioplasty implant and graft: Secondary | ICD-10-CM

## 2015-02-07 DIAGNOSIS — E785 Hyperlipidemia, unspecified: Secondary | ICD-10-CM

## 2015-02-07 DIAGNOSIS — I252 Old myocardial infarction: Secondary | ICD-10-CM

## 2015-02-07 NOTE — Progress Notes (Signed)
Patient ID: [name], male   DOB: 1946-11-04, 69 y.o.   MRN: [id number]      SUBJECTIVE: [name] is a 69 yr old gentleman with long-standing hypertension and hypertensive heart disease as well as coronary artery disease and sustained a NSTEMI on 11/16/2012 and received a drug-eluting stent to the LAD and D1, with residual diffuse nonobstructive disease in the RCA and LCx. He also has CKD, sleep apnea, obesity, and reportedly also has chronic diastolic heart failure. Most recent echocardiogram in November 2014 demonstrated normal left ventricular systolic function, EF 83-41%, wall motion abnormalities consistent with coronary artery disease and prior MI, and grade 1 diastolic dysfunction. I most recently evaluated him in 10/15 and he had been doing well. He continues to do very well and exercises four days a week. He bikes approximately 6 miles and also uses the elliptical. He denies chest pain, shortness of breath, leg swelling, orthopnea, paroxysmal nocturnal dyspnea, lightheadedness, dizziness and syncope. He successfully underwent right knee replacement surgery last year, and is being scheduled for left knee replacement surgery.   Review of Systems: As per "subjective", otherwise negative.  Allergies  Allergen Reactions  . Niacin And Related Other (See Comments)    Reaction: flushing and burning side effects even with extended release    Current Outpatient Prescriptions  Medication Sig Dispense Refill  . acetaminophen (TYLENOL) 325 MG tablet Take 1-2 tablets (325-650 mg total) by mouth every 6 (six) hours as needed.    Marland Kitchen allopurinol (ZYLOPRIM) 300 MG tablet Take 300 mg by mouth daily.    Marland Kitchen ALPRAZolam (XANAX) 0.5 MG tablet Take one tablet by mouth twice daily as needed for anxiety 60 tablet 5  . amLODipine (NORVASC) 10 MG tablet Take 10 mg by mouth daily.    Marland Kitchen aspirin 81 MG tablet Take 81 mg by mouth daily.    Marland Kitchen COLCRYS 0.6 MG tablet Take 0.6 mg by mouth daily.     Marland Kitchen docusate  sodium 100 MG CAPS Take 100 mg by mouth 2 (two) times daily. 10 capsule 0  . ferrous sulfate 325 (65 FE) MG tablet Take 1 tablet (325 mg total) by mouth 3 (three) times daily after meals.  3  . furosemide (LASIX) 40 MG tablet Take 40 mg by mouth daily.     Marland Kitchen gabapentin (NEURONTIN) 300 MG capsule Take 300 mg by mouth 2 (two) times daily.     Marland Kitchen HYDROcodone-acetaminophen (NORCO) 10-325 MG per tablet     . metoprolol (LOPRESSOR) 50 MG tablet Take 50 mg by mouth 2 (two) times daily.    Marland Kitchen NITROSTAT 0.4 MG SL tablet DISSOLVE ONE TABLET UNDER THE TONGUE AS NEEDED FOR CHEST PAIN. 25 tablet 1  . omeprazole (PRILOSEC) 20 MG capsule Take 20 mg by mouth daily.     . ONGLYZA 5 MG TABS tablet Take 5 mg by mouth daily.     Marland Kitchen oxyCODONE (OXY IR/ROXICODONE) 5 MG immediate release tablet Take one to three tablets by  Mouth every 4 hours as needed for severe pain 360 tablet 0  . pantoprazole (PROTONIX) 40 MG tablet Take 1 tablet (40 mg total) by mouth daily before breakfast. 30 tablet 0  . polyethylene glycol (MIRALAX / GLYCOLAX) packet Take 17 g by mouth 2 (two) times daily. 14 each 0  . pravastatin (PRAVACHOL) 20 MG tablet Take 20 mg by mouth at bedtime.      No current facility-administered medications for this visit.    Past Medical History  Diagnosis  Date  . Diastolic heart failure     Pedal edema, "a long time ago"  . Hypertension     Exercise induced  . NSVT (nonsustained ventricular tachycardia)     Exercise induced  . 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 Obesity   . Degenerative joint disease   . Benign prostatic hypertrophy     Nocturia  . Erectile dysfunction   . High cholesterol   . CAD (coronary artery disease)     a.  NSTEMI 10/2012 s/p DES to LAD & DES to 1st diagonal with residual diffuse nonobstructive dz in LCx/RCA   . Chronic kidney disease, stage 3, mod decreased GFR     Creatinine of 1.5 in 03/2009  . Diabetes mellitus, type II 12/10/2012.  Marland Kitchen Anxiety   .  Depression     hx of  . Flu 2013    hx of  . Hemorrhoids   . Tubular adenoma 2014    Past Surgical History  Procedure Laterality Date  . Cardiac catheterization      with stent placement  . Back surgery  2007    x2  . Hernia repair    . Total knee arthroplasty Right 11/15/2013    Procedure: RIGHT TOTAL KNEE ARTHROPLASTY;  Surgeon: Mauri Pole, MD;  Location: [facility];  Service: Orthopedics;  Laterality: Right;  . Colonoscopy with esophagogastroduodenoscopy (egd) N/A 11/23/2013    Dr. Oneida Alar- TCS= left sided colitis and mild proctitis, moderate internal hemorrhoids, small nodule in the rectum, bx=tubular adenoma, EGD=-mild non-erosive gastritis  . Left heart catheterization with coronary angiogram N/A 11/16/2012    Procedure: LEFT HEART CATHETERIZATION WITH CORONARY ANGIOGRAM;  Surgeon: Sherren Mocha, MD;  Location[facility] C[facility] LAB;  Service: Cardiovascular;  Laterality: N/A;  . Percutaneous coronary stent intervention (pci-s)  11/16/2012    Procedure: PERCUTANEOUS CORONARY STENT INTERVENTION (PCI-S);  Surgeon: Sherren Mocha, MD;  Location: [facility] CATH LAB;  Service: Cardiovascular;;    History   Social History  . Marital Status: Widowed    Spouse Name: N/A  . Number of Children: N/A  . Years of Education: N/A   Occupational History  . Disabled    Social History Main Topics  . Smoking status: Former Smoker -- 1.00 packs/day for 30 years    Types: Cigarettes    Quit date: 12/23/1992  . Smokeless tobacco: Never Used  . Alcohol Use: No  . Drug Use: No  . Sexual Activity: Not on file   Other Topics Concern  . Not on file   Social History Narrative   Married    BP 152/72  Pulse 55  SpO2 98%  Weight 265 lb (120.203 kg) Height 6' 3"  (1.905 m)   PHYSICAL EXAM General: NAD HEENT: Normal. Neck: No JVD, no thyromegaly. Lungs: Clear to auscultation bilaterally with normal respiratory effort. CV: Nondisplaced PMI.  Regular rate and rhythm, normal S1/S2, no S3/S4, no murmur.  No pretibial or periankle edema.  No carotid bruit.  Normal pedal pulses.  Abdomen: Soft, nontender, no hepatosplenomegaly, no distention.  Neurologic: Alert and oriented x 3.  Psych: Normal affect. Skin: Normal. Musculoskeletal: Normal range of motion, no gross deformities. Extremities: No clubbing or cyanosis.   ECG: Most recent ECG reviewed.      ASSESSMENT AND PLAN: 1. CAD s/p PCI of LAD and D1: Stable ischemic heart disease. Continue aspirin 81 mg, metoprolol 50 mg twice daily, and pravastatin 20 mg daily. 2. Essential HTN: Well  controlled at last visit but elevated today on current therapy which includes amlodipine 10 mg daily. No changes to therapy for now. Will continue to monitor. 3. Hyperlipidemia: Lipids in 10/15 showed TC 122, TG 121, HDL 31, LDL 67. Continue pravastatin 20 mg.  4. Chronic diastolic heart failure: Euvolemic and compensated. No changes in diuretic regimen which includes Lasix 40 mg daily. 5. Erectile dysfunction: I previously prescribed Viagra. Instructed not to be taken with nitrates. 6. First degree AV block and bradycardia: He is entirely asymptomatic. Will continue metoprolol at current dose. 7. Preoperative risk stratification: Given his excellent exercise tolerance and normal LV systolic function, my opinion is he is at low risk for a major adverse perioperative cardiac event with continued medical therapy including ASA, beta blocker, and statin.  Dispo: f/u 1 year.  Kate Sable, M.D., F.A.C.C.

## 2015-02-07 NOTE — Patient Instructions (Signed)
Continue all current medications. Your physician wants you to follow up in:  1 year.  You will receive a reminder letter in the mail one-two months in advance.  If you don't receive a letter, please call our office to schedule the follow up appointment   

## 2015-02-10 NOTE — H&P (Signed)
TOTAL KNEE ADMISSION H&P  Patient is being admitted for left total knee arthroplasty.  Subjective:  Chief Complaint:     Left knee primary OA / pain.  HPI: [name], 69 y.o. male, has a history of pain and functional disability in the left knee due to arthritis and has failed non-surgical conservative treatments for greater than 12 weeks to include NSAID's and/or analgesics, use of assistive devices and activity modification.  Onset of symptoms was gradual, starting 2+ years ago with gradually worsening course since that time. The patient noted prior procedures on the knee to include  arthroplasty on the right knee per Dr. Alvan Dame on 11/15/2013.  Patient currently rates pain in the left knee(s) at 8 out of 10 with activity. Patient has night pain, worsening of pain with activity and weight bearing, pain that interferes with activities of daily living, pain with passive range of motion, crepitus and joint swelling.  Patient has evidence of periarticular osteophytes and joint space narrowing by imaging studies.   There is no active infection.  Risks, benefits and expectations were discussed with the patient.  Risks including but not limited to the risk of anesthesia, blood clots, nerve damage, blood vessel damage, failure of the prosthesis, infection and up to and including death.  Patient understand the risks, benefits and expectations and wishes to proceed with surgery.   PCP: Collene Mares, PA-C  D/C Plans:      SNF  Post-op Meds: No Rx given  Tranexamic Acid: Topically - CAD with previous MI  Decadron:      Is to be given   FYI: Xarelto 2 weeks then ASA  Oxycodone post-op  Previous surgery components: Right TKA utilizing a DePuy RP posterior stabilized knee system  6 femur  5 MBT revision tray with a 29 cemented sleeve, 5 mm medial and lateral augments  12.5 posterior stabilized insert  41 patellar button   Patient Active Problem List   Diagnosis Date Noted  .  Muscle weakness (generalized) 01/13/2014  . Knee stiffness 01/13/2014  . Gout 12/11/2013  . Cellulitis 11/19/2013  . Expected blood loss anemia 11/17/2013  . Obese 11/17/2013  . S/P right TKA 11/15/2013  . Anemia, normocytic normochromic 01/12/2013  . Diabetes mellitus 12/10/2012  . 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 Overweight 11/17/2012  . Obstructive sleep apnea 11/15/2012  . Chronic kidney disease, stage 3, cardiorenal syndrome 08/23/2011  . Hypertension    Past Medical History  Diagnosis Date  . Diastolic heart failure     Pedal edema, "a long time ago"  . Hypertension     Exercise induced  . NSVT (nonsustained ventricular tachycardia)     Exercise induced  . 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 Obesity   . Degenerative joint disease   . Benign prostatic hypertrophy     Nocturia  . Erectile dysfunction   . High cholesterol   . CAD (coronary artery disease)     a.  NSTEMI 10/2012 s/p DES to LAD & DES to 1st diagonal with residual diffuse nonobstructive dz in LCx/RCA   . Chronic kidney disease, stage 3, mod decreased GFR     Creatinine of 1.5 in 03/2009  . Diabetes mellitus, type II 12/10/2012.  Marland Kitchen Anxiety   . Depression     hx of  . Flu 2013    hx of  . Hemorrhoids   . Tubular adenoma 2014    Past Surgical History  Procedure Laterality Date  . Cardiac catheterization      with stent placement  . Back surgery  2007    x2  . Hernia repair    . Total knee arthroplasty Right 11/15/2013    Procedure: RIGHT TOTAL KNEE ARTHROPLASTY;  Surgeon: Mauri Pole, MD;  Location: [facility];  Service: Orthopedics;  Laterality: Right;  . Colonoscopy with esophagogastroduodenoscopy (egd) N/A 11/23/2013    Dr. Oneida Alar- TCS= left sided colitis and mild proctitis, moderate internal hemorrhoids, small nodule in the rectum, bx=tubular adenoma, EGD=-mild non-erosive gastritis  . Left heart catheterization with coronary angiogram N/A  11/16/2012    Procedure: LEFT HEART CATHETERIZATION WITH CORONARY ANGIOGRAM;  Surgeon: Sherren Mocha, MD;  Location: [facility] CATH LAB;  Service: Cardiovascular;  Laterality: N/A;  . Percutaneous coronary stent intervention (pci-s)  11/16/2012    Procedure: PERCUTANEOUS CORONARY STENT INTERVENTION (PCI-S);  Surgeon: Sherren Mocha, MD;  Location: [facility] CATH LAB;  Service: Cardiovascular;;    No prescriptions prior to admission   Allergies  Allergen Reactions  . Niacin And Related Other (See Comments)    Reaction: flushing and burning side effects even with extended release    History  Substance Use Topics  . Smoking status: Former Smoker -- 1.00 packs/day for 30 years    Types: Cigarettes    Quit date: 12/23/1992  . Smokeless tobacco: Never Used  . Alcohol Use: No    Family History  Problem Relation Age of Onset  . Hypertension Other   . Cancer Other   . Heart disease    . Diabetes    . Colon cancer Neg Hx      Review of Systems  Constitutional: Negative.   HENT: Negative.   Eyes: Negative.   Respiratory: Negative.   Cardiovascular: Positive for leg swelling.  Gastrointestinal: Negative.   Genitourinary: Negative.   Musculoskeletal: Positive for joint pain.  Skin: Negative.   Neurological: Negative.   Endo/Heme/Allergies: Negative.   Psychiatric/Behavioral: Positive for depression. The patient is nervous/anxious.     Objective:  Physical Exam  Constitutional: He is oriented to person, place, and time. He appears well-developed and well-nourished.  HENT:  Head: Normocephalic and atraumatic.  Eyes: Pupils are equal, round, and reactive to light.  Neck: Neck supple. No JVD present. No tracheal deviation present. No thyromegaly present.  Cardiovascular: Normal rate, regular rhythm, normal heart sounds and intact distal pulses.   Respiratory: Effort normal and breath sounds normal. No stridor. No respiratory distress. He has no wheezes.  GI: Soft. There is no tenderness.  There is no guarding.  Musculoskeletal:       Left knee: He exhibits decreased range of motion, swelling and bony tenderness. He exhibits no ecchymosis, no deformity, no laceration and no erythema. Tenderness found.  Lymphadenopathy:    He has no cervical adenopathy.  Neurological: He is alert and oriented to person, place, and time.  Skin: Skin is warm and dry.  Psychiatric: He has a normal mood and affect.     Labs:  Estimated body mass index is 32.50 kg/(m^2) as calculated from the following:   Height as of 10/10/14: 6' 3"  (1.905 m).   Weight as of 10/10/14: 117.935 kg (260 lb).   Imaging Review Plain radiographs demonstrate severe degenerative joint disease of the left knee(s). The overall alignment is  neutral. The bone quality appears to be good for age and reported activity level.  Assessment/Plan:  End stage arthritis, left knee   The patient history, physical examination, clinical judgment  of the provider and imaging studies are consistent with end stage degenerative joint disease of the left knee(s) and total knee arthroplasty is deemed medically necessary. The treatment options including medical management, injection therapy arthroscopy and arthroplasty were discussed at length. The risks and benefits of total knee arthroplasty were presented and reviewed. The risks due to aseptic loosening, infection, stiffness, patella tracking problems, thromboembolic complications and other imponderables were discussed. The patient acknowledged the explanation, agreed to proceed with the plan and consent was signed. Patient is being admitted for inpatient treatment for surgery, pain control, PT, OT, prophylactic antibiotics, VTE prophylaxis, progressive ambulation and ADL's and discharge planning. The patient is planning to be discharged to skilled nursing facility.      West Pugh Tersea Aulds   PA-C  02/10/2015, 11:12 AM

## 2015-02-17 DIAGNOSIS — E6609 Other obesity due to excess calories: Secondary | ICD-10-CM | POA: Diagnosis not present

## 2015-02-17 DIAGNOSIS — Z6833 Body mass index (BMI) 33.0-33.9, adult: Secondary | ICD-10-CM | POA: Diagnosis not present

## 2015-02-17 DIAGNOSIS — G894 Chronic pain syndrome: Secondary | ICD-10-CM | POA: Diagnosis not present

## 2015-03-01 NOTE — Patient Instructions (Addendum)
[name]  03/01/2015   Your procedure is scheduled on: 03/07/2015    Report to [facility] Main  Entrance and follow signs to               [place] at     0700 AM.  Call this number if you have problems the morning of surgery [phone]   Remember: Eat a good healthy snack prior to bedtime.    Do not eat food or drink liquids :After Midnight.     Take these medicines the morning of surgery with A SIP OF WATER: Xanax if needed,Allopurinol, Amlodipine ( Norvasc), Metoprolol ( Lopressor ) , Prilosec                               You may not have any metal on your body including hair pins and              piercings  Do not wear jewelry, , lotions, powders or perfumes., deodorant.                           Men may shave face and neck.   Do not bring valuables to the hospital. [place].  Contacts, dentures or bridgework may not be worn into surgery.  Leave suitcase in the car. After surgery it may be brought to your room.     Special Instructions: coughing and deep breathing exercises, leg exercises               Please read over the following fact sheets you were given: _____________________________________________________________________             [facility] - Preparing for Surgery Before surgery, you can play an important role.  Because skin is not sterile, your skin needs to be as free of germs as possible.  You can reduce the number of germs on your skin by washing with CHG (chlorahexidine gluconate) soap before surgery.  CHG is an antiseptic cleaner which kills germs and bonds with the skin to continue killing germs even after washing. Please DO NOT use if you have an allergy to CHG or antibacterial soaps.  If your skin becomes reddened/irritated stop using the CHG and inform your nurse when you arrive at Short Stay. Do not shave (including legs and underarms) for at least 48 hours prior  to the first CHG shower.  You may shave your face/neck. Please follow these instructions carefully:  1.  Shower with CHG Soap the night before surgery and the  morning of Surgery.  2.  If you choose to wash your hair, wash your hair first as usual with your  normal  shampoo.  3.  After you shampoo, rinse your hair and body thoroughly to remove the  shampoo.                           4.  Use CHG as you would any other liquid soap.  You can apply chg directly  to the skin and wash                       Gently with a  scrungie or clean washcloth.  5.  Apply the CHG Soap to your body ONLY FROM THE NECK DOWN.   Do not use on face/ open                           Wound or open sores. Avoid contact with eyes, ears mouth and genitals (private parts).                       Wash face,  Genitals (private parts) with your normal soap.             6.  Wash thoroughly, paying special attention to the area where your surgery  will be performed.  7.  Thoroughly rinse your body with warm water from the neck down.  8.  DO NOT shower/wash with your normal soap after using and rinsing off  the CHG Soap.                9.  Pat yourself dry with a clean towel.            10.  Wear clean pajamas.            11.  Place clean sheets on your bed the night of your first shower and do not  sleep with pets. Day of Surgery : Do not apply any lotions/deodorants the morning of surgery.  Please wear clean clothes to the hospital/surgery center.  FAILURE TO FOLLOW THESE INSTRUCTIONS MAY RESULT IN THE CANCELLATION OF YOUR SURGERY PATIENT SIGNATURE_________________________________  NURSE SIGNATURE__________________________________  ________________________________________________________________________  WHAT IS A BLOOD TRANSFUSION? Blood Transfusion Information  A transfusion is the replacement of blood or some of its parts. Blood is made up of multiple cells which provide different functions.  Red blood cells carry oxygen  and are used for blood loss replacement.  White blood cells fight against infection.  Platelets control bleeding.  Plasma helps clot blood.  Other blood products are available for specialized needs, such as hemophilia or other clotting disorders. BEFORE THE TRANSFUSION  Who gives blood for transfusions?   Healthy volunteers who are fully evaluated to make sure their blood is safe. This is blood bank blood. Transfusion therapy is the safest it has ever been in the practice of medicine. Before blood is taken from a donor, a complete history is taken to make sure that person has no history of diseases nor engages in risky social behavior (examples are intravenous drug use or sexual activity with multiple partners). The donor's travel history is screened to minimize risk of transmitting infections, such as malaria. The donated blood is tested for signs of infectious diseases, such as HIV and hepatitis. The blood is then tested to be sure it is compatible with you in order to minimize the chance of a transfusion reaction. If you or a relative donates blood, this is often done in anticipation of surgery and is not appropriate for emergency situations. It takes many days to process the donated blood. RISKS AND COMPLICATIONS Although transfusion therapy is very safe and saves many lives, the main dangers of transfusion include:  1. Getting an infectious disease. 2. Developing a transfusion reaction. This is an allergic reaction to something in the blood you were given. Every precaution is taken to prevent this. The decision to have a blood transfusion has been considered carefully by your caregiver before blood is given. Blood is not given unless the benefits outweigh the risks. AFTER  THE TRANSFUSION  Right after receiving a blood transfusion, you will usually feel much better and more energetic. This is especially true if your red blood cells have gotten low (anemic). The transfusion raises the level  of the red blood cells which carry oxygen, and this usually causes an energy increase.  The nurse administering the transfusion will monitor you carefully for complications. HOME CARE INSTRUCTIONS  No special instructions are needed after a transfusion. You may find your energy is better. Speak with your caregiver about any limitations on activity for underlying diseases you may have. SEEK MEDICAL CARE IF:   Your condition is not improving after your transfusion.  You develop redness or irritation at the intravenous (IV) site. SEEK IMMEDIATE MEDICAL CARE IF:  Any of the following symptoms occur over the next 12 hours:  Shaking chills.  You have a temperature by mouth above 102 F (38.9 C), not controlled by medicine.  Chest, back, or muscle pain.  People around you feel you are not acting correctly or are confused.  Shortness of breath or difficulty breathing.  Dizziness and fainting.  You get a rash or develop hives.  You have a decrease in urine output.  Your urine turns a dark color or changes to pink, red, or brown. Any of the following symptoms occur over the next 10 days:  You have a temperature by mouth above 102 F (38.9 C), not controlled by medicine.  Shortness of breath.  Weakness after normal activity.  The white part of the eye turns yellow (jaundice).  You have a decrease in the amount of urine or are urinating less often.  Your urine turns a dark color or changes to pink, red, or brown. Document Released: 12/06/2000 Document Revised: 03/02/2012 Document Reviewed: 07/25/2008 ExitCare Patient Information 2014 [place].  _______________________________________________________________________  Incentive Spirometer  An incentive spirometer is a tool that can help keep your lungs clear and active. This tool measures how well you are filling your lungs with each breath. Taking long deep breaths may help reverse or decrease the chance of developing  breathing (pulmonary) problems (especially infection) following:  A long period of time when you are unable to move or be active. BEFORE THE PROCEDURE   If the spirometer includes an indicator to show your best effort, your nurse or respiratory therapist will set it to a desired goal.  If possible, sit up straight or lean slightly forward. Try not to slouch.  Hold the incentive spirometer in an upright position. INSTRUCTIONS FOR USE  3. Sit on the edge of your bed if possible, or sit up as far as you can in bed or on a chair. 4. Hold the incentive spirometer in an upright position. 5. Breathe out normally. 6. Place the mouthpiece in your mouth and seal your lips tightly around it. 7. Breathe in slowly and as deeply as possible, raising the piston or the ball toward the top of the column. 8. Hold your breath for 3-5 seconds or for as long as possible. Allow the piston or ball to fall to the bottom of the column. 9. Remove the mouthpiece from your mouth and breathe out normally. 10. Rest for a few seconds and repeat Steps 1 through 7 at least 10 times every 1-2 hours when you are awake. Take your time and take a few normal breaths between deep breaths. 11. The spirometer may include an indicator to show your best effort. Use the indicator as a goal to work toward during each  repetition. 12. After each set of 10 deep breaths, practice coughing to be sure your lungs are clear. If you have an incision (the cut made at the time of surgery), support your incision when coughing by placing a pillow or rolled up towels firmly against it. Once you are able to get out of bed, walk around indoors and cough well. You may stop using the incentive spirometer when instructed by your caregiver.  RISKS AND COMPLICATIONS  Take your time so you do not get dizzy or light-headed.  If you are in pain, you may need to take or ask for pain medication before doing incentive spirometry. It is harder to take a deep  breath if you are having pain. AFTER USE  Rest and breathe slowly and easily.  It can be helpful to keep track of a log of your progress. Your caregiver can provide you with a simple table to help with this. If you are using the spirometer at home, follow these instructions: [place] IF:   You are having difficultly using the spirometer.  You have trouble using the spirometer as often as instructed.  Your pain medication is not giving enough relief while using the spirometer.  You develop fever of 100.5 F (38.1 C) or higher. SEEK IMMEDIATE MEDICAL CARE IF:   You cough up bloody sputum that had not been present before.  You develop fever of 102 F (38.9 C) or greater.  You develop worsening pain at or near the incision site. MAKE SURE YOU:   Understand these instructions.  Will watch your condition.  Will get help right away if you are not doing well or get worse. Document Released: 04/21/2007 Document Revised: 03/02/2012 Document Reviewed: 06/22/2007 [facility] Patient Information 2014 [place], [place].   ________________________________________________________________________

## 2015-03-02 ENCOUNTER — Encounter (HOSPITAL_COMMUNITY)
Admission: RE | Admit: 2015-03-02 | Discharge: 2015-03-02 | Disposition: A | Discharge status: Home / Self Care | Payer: Medicare Other | Source: Ambulatory Visit | Attending: Orthopedic Surgery | Admitting: Orthopedic Surgery

## 2015-03-02 ENCOUNTER — Encounter (HOSPITAL_COMMUNITY): Payer: Self-pay

## 2015-03-02 ENCOUNTER — Telehealth: Payer: Self-pay

## 2015-03-02 DIAGNOSIS — M179 Osteoarthritis of knee, unspecified: Secondary | ICD-10-CM | POA: Insufficient documentation

## 2015-03-02 DIAGNOSIS — Z01818 Encounter for other preprocedural examination: Secondary | ICD-10-CM | POA: Insufficient documentation

## 2015-03-02 HISTORY — DX: Gastro-esophageal reflux disease without esophagitis: K21.9

## 2015-03-02 LAB — URINALYSIS, ROUTINE W REFLEX MICROSCOPIC
Bilirubin Urine: NEGATIVE
GLUCOSE, UA: NEGATIVE mg/dL
HGB URINE DIPSTICK: NEGATIVE
Ketones, ur: NEGATIVE mg/dL
Leukocytes, UA: NEGATIVE
Nitrite: NEGATIVE
PH: 7 (ref 5.0–8.0)
Protein, ur: NEGATIVE mg/dL
Specific Gravity, Urine: 1.008 (ref 1.005–1.030)
Urobilinogen, UA: 1 mg/dL (ref 0.0–1.0)

## 2015-03-02 LAB — CBC
HCT: 37.2 % — ABNORMAL LOW (ref 39.0–52.0)
HEMOGLOBIN: 12.1 g/dL — AB (ref 13.0–17.0)
MCH: 32.9 pg (ref 26.0–34.0)
MCHC: 32.5 g/dL (ref 30.0–36.0)
MCV: 101.1 fL — ABNORMAL HIGH (ref 78.0–100.0)
Platelets: 157 10*3/uL (ref 150–400)
RBC: 3.68 MIL/uL — ABNORMAL LOW (ref 4.22–5.81)
RDW: 15.3 % (ref 11.5–15.5)
WBC: 5.6 10*3/uL (ref 4.0–10.5)

## 2015-03-02 LAB — BASIC METABOLIC PANEL
Anion gap: 8 (ref 5–15)
BUN: 33 mg/dL — ABNORMAL HIGH (ref 6–23)
CALCIUM: 9.1 mg/dL (ref 8.4–10.5)
CO2: 27 mmol/L (ref 19–32)
Chloride: 106 mmol/L (ref 96–112)
Creatinine, Ser: 1.8 mg/dL — ABNORMAL HIGH (ref 0.50–1.35)
GFR calc Af Amer: 43 mL/min — ABNORMAL LOW (ref >90)
GFR calc non Af Amer: 37 mL/min — ABNORMAL LOW (ref >90)
Glucose, Bld: 93 mg/dL (ref 70–99)
Potassium: 4.3 mmol/L (ref 3.5–5.1)
Sodium: 141 mmol/L (ref 135–145)

## 2015-03-02 LAB — SURGICAL PCR SCREEN
MRSA, PCR: NEGATIVE
Staphylococcus aureus: POSITIVE — AB

## 2015-03-02 LAB — APTT: aPTT: 33 seconds (ref 24–37)

## 2015-03-02 LAB — PROTIME-INR
INR: 1.08 (ref 0.00–1.49)
PROTHROMBIN TIME: 14.1 s (ref 11.6–15.2)

## 2015-03-02 NOTE — Progress Notes (Signed)
Weight on preop visit on 03/02/2015 was 297 lbs.  Checked x 2.  Sat 97% .  B/P was 133/53 pulse 49.  Patient voice no complaints  Noted weight on office visit of 02/07/2015 to be 265 lbs with Dr Bronson Ing.  FYI.

## 2015-03-02 NOTE — Progress Notes (Signed)
BMP results done 03/02/2015 faxed via EPIC to Dr Alvan Dame.

## 2015-03-02 NOTE — Telephone Encounter (Signed)
I spoke with patient and he states he feels fine and he exercises 4 days at at gym,lift weights,does cardio.Has no complaints.No SOB,rare leg swelling but he takes his Lasix as directed. He is insistent their scales were wrong. He states they weighed him one time

## 2015-03-02 NOTE — Telephone Encounter (Signed)
Attempt to reach pt to asses,no answer,no machine at any of his contact numbers. i called pre-op testing at [place] to see if they have updated pt phone number

## 2015-03-02 NOTE — Progress Notes (Addendum)
EKG- 10/11/14 EPIC  10/2013 ECHO- EPIC  DR Bronson Ing- 02/07/15-Preop clearance clearance in note and on chart .  CATH - 2013 - discusses in 2013 office visit.  In [place], [facility] clearance on chart along with office visit note from Rowan Blase, [facility] on chart

## 2015-03-06 MED ORDER — DEXTROSE 5 % IV SOLN
3.0000 g | INTRAVENOUS | Status: AC
  Administered 2015-03-07: 3 g via INTRAVENOUS
  Filled 2015-03-06: qty 3000

## 2015-03-07 ENCOUNTER — Encounter (HOSPITAL_COMMUNITY): Payer: Self-pay | Admitting: *Deleted

## 2015-03-07 ENCOUNTER — Inpatient Hospital Stay (HOSPITAL_COMMUNITY): Payer: Medicare Other | Admitting: Anesthesiology

## 2015-03-07 ENCOUNTER — Inpatient Hospital Stay (HOSPITAL_COMMUNITY)
Admission: RE | Admit: 2015-03-07 | Discharge: 2015-03-09 | DRG: 470 | Disposition: A | Discharge status: Skilled Nursing Facility | Payer: Medicare Other | Source: Ambulatory Visit | Attending: Orthopedic Surgery | Admitting: Orthopedic Surgery

## 2015-03-07 ENCOUNTER — Encounter (HOSPITAL_COMMUNITY)
Admission: RE | Disposition: A | Discharge status: Skilled Nursing Facility | Payer: Self-pay | Source: Ambulatory Visit | Attending: Orthopedic Surgery

## 2015-03-07 DIAGNOSIS — Z6837 Body mass index (BMI) 37.0-37.9, adult: Secondary | ICD-10-CM | POA: Diagnosis not present

## 2015-03-07 DIAGNOSIS — G4733 Obstructive sleep apnea (adult) (pediatric): Secondary | ICD-10-CM | POA: Diagnosis not present

## 2015-03-07 DIAGNOSIS — I129 Hypertensive chronic kidney disease with stage 1 through stage 4 chronic kidney disease, or unspecified chronic kidney disease: Secondary | ICD-10-CM | POA: Diagnosis present

## 2015-03-07 DIAGNOSIS — Z87891 Personal history of nicotine dependence: Secondary | ICD-10-CM | POA: Diagnosis not present

## 2015-03-07 DIAGNOSIS — M1712 Unilateral primary osteoarthritis, left knee: Secondary | ICD-10-CM | POA: Diagnosis not present

## 2015-03-07 DIAGNOSIS — I2510 Atherosclerotic heart disease of native coronary artery without angina pectoris: Secondary | ICD-10-CM | POA: Diagnosis present

## 2015-03-07 DIAGNOSIS — R262 Difficulty in walking, not elsewhere classified: Secondary | ICD-10-CM | POA: Diagnosis not present

## 2015-03-07 DIAGNOSIS — M659 Synovitis and tenosynovitis, unspecified: Secondary | ICD-10-CM | POA: Diagnosis present

## 2015-03-07 DIAGNOSIS — I252 Old myocardial infarction: Secondary | ICD-10-CM

## 2015-03-07 DIAGNOSIS — Z01812 Encounter for preprocedural laboratory examination: Secondary | ICD-10-CM | POA: Diagnosis not present

## 2015-03-07 DIAGNOSIS — E669 Obesity, unspecified: Secondary | ICD-10-CM | POA: Diagnosis present

## 2015-03-07 DIAGNOSIS — M1990 Unspecified osteoarthritis, unspecified site: Secondary | ICD-10-CM | POA: Diagnosis not present

## 2015-03-07 DIAGNOSIS — D649 Anemia, unspecified: Secondary | ICD-10-CM | POA: Diagnosis not present

## 2015-03-07 DIAGNOSIS — R278 Other lack of coordination: Secondary | ICD-10-CM | POA: Diagnosis not present

## 2015-03-07 DIAGNOSIS — N183 Chronic kidney disease, stage 3 (moderate): Secondary | ICD-10-CM | POA: Diagnosis not present

## 2015-03-07 DIAGNOSIS — Z96659 Presence of unspecified artificial knee joint: Secondary | ICD-10-CM

## 2015-03-07 DIAGNOSIS — I5032 Chronic diastolic (congestive) heart failure: Secondary | ICD-10-CM | POA: Diagnosis not present

## 2015-03-07 DIAGNOSIS — E119 Type 2 diabetes mellitus without complications: Secondary | ICD-10-CM | POA: Diagnosis not present

## 2015-03-07 DIAGNOSIS — M6281 Muscle weakness (generalized): Secondary | ICD-10-CM | POA: Diagnosis not present

## 2015-03-07 DIAGNOSIS — Z96652 Presence of left artificial knee joint: Secondary | ICD-10-CM

## 2015-03-07 DIAGNOSIS — S83105A Unspecified dislocation of left knee, initial encounter: Secondary | ICD-10-CM | POA: Diagnosis not present

## 2015-03-07 DIAGNOSIS — M25562 Pain in left knee: Secondary | ICD-10-CM | POA: Diagnosis not present

## 2015-03-07 DIAGNOSIS — I10 Essential (primary) hypertension: Secondary | ICD-10-CM | POA: Diagnosis not present

## 2015-03-07 HISTORY — PX: TOTAL KNEE ARTHROPLASTY: SHX125

## 2015-03-07 LAB — GLUCOSE, CAPILLARY
Glucose-Capillary: 91 mg/dL (ref 70–99)
Glucose-Capillary: 103 mg/dL — ABNORMAL HIGH (ref 70–99)
Glucose-Capillary: 160 mg/dL — ABNORMAL HIGH (ref 70–99)
Glucose-Capillary: 147 mg/dL — ABNORMAL HIGH (ref 70–99)

## 2015-03-07 LAB — TYPE AND SCREEN
ABO/RH(D): A POS
ANTIBODY SCREEN: NEGATIVE

## 2015-03-07 SURGERY — ARTHROPLASTY, KNEE, TOTAL
Anesthesia: Monitor Anesthesia Care | Site: Knee | Laterality: Left

## 2015-03-07 MED ORDER — PROPOFOL 10 MG/ML IV BOLUS
INTRAVENOUS | Status: DC | PRN
  Administered 2015-03-07: 40 mg via INTRAVENOUS
  Administered 2015-03-07: 50 mg via INTRAVENOUS
  Administered 2015-03-07: 200 mg via INTRAVENOUS

## 2015-03-07 MED ORDER — OXYCODONE HCL 5 MG/5ML PO SOLN
5.0000 mg | Freq: Once | ORAL | Status: DC | PRN
  Filled 2015-03-07: qty 5

## 2015-03-07 MED ORDER — METOCLOPRAMIDE HCL 10 MG PO TABS: 5.0000 mg | ORAL_TABLET | Freq: Three times a day (TID) | ORAL | Status: DC | PRN

## 2015-03-07 MED ORDER — DEXAMETHASONE SODIUM PHOSPHATE 10 MG/ML IJ SOLN: 10.0000 mg | Freq: Once | INTRAMUSCULAR | Status: DC

## 2015-03-07 MED ORDER — FENTANYL CITRATE 0.05 MG/ML IJ SOLN
INTRAMUSCULAR | Status: DC | PRN
  Administered 2015-03-07: 25 ug via INTRAVENOUS
  Administered 2015-03-07: 50 ug via INTRAVENOUS
  Administered 2015-03-07: 25 ug via INTRAVENOUS
  Administered 2015-03-07 (×2): 50 ug via INTRAVENOUS

## 2015-03-07 MED ORDER — FUROSEMIDE 40 MG PO TABS
60.0000 mg | ORAL_TABLET | Freq: Every morning | ORAL | Status: DC
  Administered 2015-03-08 – 2015-03-09 (×2): 60 mg via ORAL
  Filled 2015-03-07 (×2): qty 1

## 2015-03-07 MED ORDER — FUROSEMIDE 10 MG/ML IJ SOLN
INTRAMUSCULAR | Status: DC | PRN
  Administered 2015-03-07: 20 mg via INTRAMUSCULAR

## 2015-03-07 MED ORDER — ACETAMINOPHEN 325 MG PO TABS: 325.0000 mg | ORAL_TABLET | Freq: Four times a day (QID) | ORAL | Status: DC | PRN

## 2015-03-07 MED ORDER — EPHEDRINE SULFATE 50 MG/ML IJ SOLN
INTRAMUSCULAR | Status: AC
  Filled 2015-03-07: qty 1

## 2015-03-07 MED ORDER — SODIUM CHLORIDE 0.9 % IV SOLN
2000.0000 mg | Freq: Once | INTRAVENOUS | Status: DC
  Filled 2015-03-07: qty 20

## 2015-03-07 MED ORDER — ONDANSETRON HCL 4 MG/2ML IJ SOLN
INTRAMUSCULAR | Status: AC
  Filled 2015-03-07: qty 2

## 2015-03-07 MED ORDER — FENTANYL CITRATE 0.05 MG/ML IJ SOLN
INTRAMUSCULAR | Status: AC
  Filled 2015-03-07: qty 2

## 2015-03-07 MED ORDER — FENTANYL CITRATE 0.05 MG/ML IJ SOLN
INTRAMUSCULAR | Status: AC
  Filled 2015-03-07: qty 5

## 2015-03-07 MED ORDER — INSULIN ASPART 100 UNIT/ML ~~LOC~~ SOLN
0.0000 [IU] | Freq: Three times a day (TID) | SUBCUTANEOUS | Status: DC
Start: 2015-03-07 — End: 2015-03-09
  Administered 2015-03-07: 3 [IU] via SUBCUTANEOUS
  Administered 2015-03-08 (×2): 2 [IU] via SUBCUTANEOUS

## 2015-03-07 MED ORDER — ONDANSETRON HCL 4 MG/2ML IJ SOLN
INTRAMUSCULAR | Status: DC | PRN
  Administered 2015-03-07: 4 mg via INTRAVENOUS

## 2015-03-07 MED ORDER — HYDROMORPHONE HCL 1 MG/ML IJ SOLN
0.2500 mg | INTRAMUSCULAR | Status: DC | PRN
  Administered 2015-03-07: 0.5 mg via INTRAVENOUS
  Administered 2015-03-07: 0.25 mg via INTRAVENOUS
  Administered 2015-03-07: 0.5 mg via INTRAVENOUS
  Administered 2015-03-07: 0.25 mg via INTRAVENOUS

## 2015-03-07 MED ORDER — PROPOFOL 10 MG/ML IV BOLUS
INTRAVENOUS | Status: AC
  Filled 2015-03-07: qty 20

## 2015-03-07 MED ORDER — CHLORHEXIDINE GLUCONATE 4 % EX LIQD: 60.0000 mL | Freq: Once | CUTANEOUS | Status: DC

## 2015-03-07 MED ORDER — ALUM & MAG HYDROXIDE-SIMETH 200-200-20 MG/5ML PO SUSP: 30.0000 mL | ORAL | Status: DC | PRN

## 2015-03-07 MED ORDER — OXYCODONE HCL 5 MG PO TABS: 5.0000 mg | ORAL_TABLET | Freq: Once | ORAL | Status: DC | PRN

## 2015-03-07 MED ORDER — FUROSEMIDE 10 MG/ML IJ SOLN
INTRAMUSCULAR | Status: AC
  Filled 2015-03-07: qty 2

## 2015-03-07 MED ORDER — ALPRAZOLAM 0.5 MG PO TABS
0.5000 mg | ORAL_TABLET | Freq: Two times a day (BID) | ORAL | Status: DC | PRN
  Administered 2015-03-08 – 2015-03-09 (×2): 0.5 mg via ORAL
  Filled 2015-03-07 (×2): qty 1

## 2015-03-07 MED ORDER — MAGNESIUM CITRATE PO SOLN: 1.0000 | Freq: Once | ORAL | Status: AC | PRN

## 2015-03-07 MED ORDER — DEXAMETHASONE SODIUM PHOSPHATE 10 MG/ML IJ SOLN
INTRAMUSCULAR | Status: AC
  Filled 2015-03-07: qty 1

## 2015-03-07 MED ORDER — KETOROLAC TROMETHAMINE 30 MG/ML IJ SOLN
INTRAMUSCULAR | Status: DC | PRN
  Administered 2015-03-07: 30 mg

## 2015-03-07 MED ORDER — ONDANSETRON HCL 4 MG PO TABS: 4.0000 mg | ORAL_TABLET | Freq: Four times a day (QID) | ORAL | Status: DC | PRN

## 2015-03-07 MED ORDER — BUPIVACAINE-EPINEPHRINE (PF) 0.25% -1:200000 IJ SOLN
INTRAMUSCULAR | Status: DC | PRN
  Administered 2015-03-07: 30 mL via PERINEURAL

## 2015-03-07 MED ORDER — KETOROLAC TROMETHAMINE 30 MG/ML IJ SOLN
INTRAMUSCULAR | Status: AC
  Filled 2015-03-07: qty 1

## 2015-03-07 MED ORDER — RIVAROXABAN 10 MG PO TABS
10.0000 mg | ORAL_TABLET | ORAL | Status: DC
  Administered 2015-03-08 – 2015-03-09 (×2): 10 mg via ORAL
  Filled 2015-03-07 (×3): qty 1

## 2015-03-07 MED ORDER — ALLOPURINOL 300 MG PO TABS
300.0000 mg | ORAL_TABLET | Freq: Every morning | ORAL | Status: DC
  Administered 2015-03-08 – 2015-03-09 (×2): 300 mg via ORAL
  Filled 2015-03-07 (×2): qty 1

## 2015-03-07 MED ORDER — POLYETHYLENE GLYCOL 3350 17 G PO PACK
17.0000 g | PACK | Freq: Two times a day (BID) | ORAL | Status: DC
  Administered 2015-03-07 – 2015-03-09 (×4): 17 g via ORAL

## 2015-03-07 MED ORDER — SODIUM CHLORIDE 0.9 % IJ SOLN
INTRAMUSCULAR | Status: AC
  Filled 2015-03-07: qty 10

## 2015-03-07 MED ORDER — EPHEDRINE SULFATE 50 MG/ML IJ SOLN
INTRAMUSCULAR | Status: DC | PRN
  Administered 2015-03-07: 10 mg via INTRAVENOUS

## 2015-03-07 MED ORDER — DIPHENHYDRAMINE HCL 25 MG PO CAPS: 25.0000 mg | ORAL_CAPSULE | Freq: Four times a day (QID) | ORAL | Status: DC | PRN

## 2015-03-07 MED ORDER — GABAPENTIN 300 MG PO CAPS
300.0000 mg | ORAL_CAPSULE | Freq: Two times a day (BID) | ORAL | Status: DC
  Administered 2015-03-07 – 2015-03-09 (×4): 300 mg via ORAL
  Filled 2015-03-07 (×5): qty 1

## 2015-03-07 MED ORDER — PROMETHAZINE HCL 25 MG/ML IJ SOLN: 6.2500 mg | INTRAMUSCULAR | Status: DC | PRN

## 2015-03-07 MED ORDER — SODIUM CHLORIDE 0.9 % IV SOLN
INTRAVENOUS | Status: DC
  Administered 2015-03-07 – 2015-03-08 (×2): via INTRAVENOUS
  Filled 2015-03-07 (×4): qty 1000

## 2015-03-07 MED ORDER — NITROGLYCERIN 0.4 MG SL SUBL: 0.4000 mg | SUBLINGUAL_TABLET | SUBLINGUAL | Status: DC | PRN

## 2015-03-07 MED ORDER — METOPROLOL TARTRATE 50 MG PO TABS
50.0000 mg | ORAL_TABLET | Freq: Two times a day (BID) | ORAL | Status: DC
  Administered 2015-03-07 – 2015-03-08 (×3): 50 mg via ORAL
  Filled 2015-03-07 (×5): qty 1

## 2015-03-07 MED ORDER — CEFAZOLIN SODIUM-DEXTROSE 2-3 GM-% IV SOLR
2.0000 g | Freq: Four times a day (QID) | INTRAVENOUS | Status: AC
  Administered 2015-03-07 (×2): 2 g via INTRAVENOUS
  Filled 2015-03-07 (×2): qty 50

## 2015-03-07 MED ORDER — BISACODYL 10 MG RE SUPP: 10.0000 mg | Freq: Every day | RECTAL | Status: DC | PRN

## 2015-03-07 MED ORDER — METHOCARBAMOL 500 MG PO TABS
500.0000 mg | ORAL_TABLET | Freq: Four times a day (QID) | ORAL | Status: DC | PRN
  Administered 2015-03-09: 500 mg via ORAL
  Filled 2015-03-07: qty 1

## 2015-03-07 MED ORDER — PHENYLEPHRINE 40 MCG/ML (10ML) SYRINGE FOR IV PUSH (FOR BLOOD PRESSURE SUPPORT)
PREFILLED_SYRINGE | INTRAVENOUS | Status: AC
  Filled 2015-03-07: qty 10

## 2015-03-07 MED ORDER — OMEPRAZOLE 20 MG PO CPDR
20.0000 mg | DELAYED_RELEASE_CAPSULE | Freq: Every day | ORAL | Status: DC
  Administered 2015-03-07 – 2015-03-09 (×3): 20 mg via ORAL
  Filled 2015-03-07 (×3): qty 1

## 2015-03-07 MED ORDER — PHENOL 1.4 % MT LIQD: 1.0000 | OROMUCOSAL | Status: DC | PRN

## 2015-03-07 MED ORDER — SODIUM CHLORIDE 0.9 % IJ SOLN
INTRAMUSCULAR | Status: DC | PRN
  Administered 2015-03-07: 30 mL

## 2015-03-07 MED ORDER — LACTATED RINGERS IV SOLN
INTRAVENOUS | Status: DC
  Administered 2015-03-07: 1000 mL via INTRAVENOUS

## 2015-03-07 MED ORDER — OXYCODONE HCL 5 MG PO TABS
5.0000 mg | ORAL_TABLET | ORAL | Status: DC
  Administered 2015-03-07 – 2015-03-09 (×9): 10 mg via ORAL
  Filled 2015-03-07 (×9): qty 2

## 2015-03-07 MED ORDER — CELECOXIB 200 MG PO CAPS
200.0000 mg | ORAL_CAPSULE | Freq: Two times a day (BID) | ORAL | Status: DC
  Administered 2015-03-07 – 2015-03-09 (×4): 200 mg via ORAL
  Filled 2015-03-07 (×5): qty 1

## 2015-03-07 MED ORDER — AMLODIPINE BESYLATE 10 MG PO TABS
10.0000 mg | ORAL_TABLET | Freq: Every morning | ORAL | Status: DC
  Administered 2015-03-08 – 2015-03-09 (×2): 10 mg via ORAL
  Filled 2015-03-07 (×2): qty 1

## 2015-03-07 MED ORDER — TRANEXAMIC ACID 100 MG/ML IV SOLN
2000.0000 mg | INTRAVENOUS | Status: DC | PRN
  Administered 2015-03-07: 2000 mg via INTRAVENOUS

## 2015-03-07 MED ORDER — METOCLOPRAMIDE HCL 5 MG/ML IJ SOLN: 5.0000 mg | Freq: Three times a day (TID) | INTRAMUSCULAR | Status: DC | PRN

## 2015-03-07 MED ORDER — PRAVASTATIN SODIUM 20 MG PO TABS
20.0000 mg | ORAL_TABLET | Freq: Every morning | ORAL | Status: DC
  Administered 2015-03-07 – 2015-03-09 (×3): 20 mg via ORAL
  Filled 2015-03-07 (×3): qty 1

## 2015-03-07 MED ORDER — SODIUM CHLORIDE 0.9 % IV SOLN
INTRAVENOUS | Status: DC | PRN
  Administered 2015-03-07 (×2): via INTRAVENOUS

## 2015-03-07 MED ORDER — HYDROMORPHONE HCL 1 MG/ML IJ SOLN
INTRAMUSCULAR | Status: AC
  Filled 2015-03-07: qty 1

## 2015-03-07 MED ORDER — DOCUSATE SODIUM 100 MG PO CAPS
100.0000 mg | ORAL_CAPSULE | Freq: Two times a day (BID) | ORAL | Status: DC
  Administered 2015-03-07 – 2015-03-09 (×4): 100 mg via ORAL

## 2015-03-07 MED ORDER — HYDROMORPHONE HCL 1 MG/ML IJ SOLN: 0.5000 mg | INTRAMUSCULAR | Status: DC | PRN

## 2015-03-07 MED ORDER — LINAGLIPTIN 5 MG PO TABS
5.0000 mg | ORAL_TABLET | Freq: Every day | ORAL | Status: DC
  Administered 2015-03-07 – 2015-03-09 (×3): 5 mg via ORAL
  Filled 2015-03-07 (×3): qty 1

## 2015-03-07 MED ORDER — METHOCARBAMOL 1000 MG/10ML IJ SOLN
500.0000 mg | Freq: Four times a day (QID) | INTRAMUSCULAR | Status: DC | PRN
  Administered 2015-03-07: 500 mg via INTRAVENOUS
  Filled 2015-03-07 (×2): qty 5

## 2015-03-07 MED ORDER — BUPIVACAINE-EPINEPHRINE (PF) 0.25% -1:200000 IJ SOLN
INTRAMUSCULAR | Status: AC
  Filled 2015-03-07: qty 30

## 2015-03-07 MED ORDER — SODIUM CHLORIDE 0.9 % IJ SOLN
INTRAMUSCULAR | Status: AC
  Filled 2015-03-07: qty 50

## 2015-03-07 MED ORDER — DEXAMETHASONE SODIUM PHOSPHATE 10 MG/ML IJ SOLN
10.0000 mg | Freq: Once | INTRAMUSCULAR | Status: AC
  Administered 2015-03-07: 10 mg via INTRAVENOUS

## 2015-03-07 MED ORDER — ONDANSETRON HCL 4 MG/2ML IJ SOLN: 4.0000 mg | Freq: Four times a day (QID) | INTRAMUSCULAR | Status: DC | PRN

## 2015-03-07 MED ORDER — MENTHOL 3 MG MT LOZG: 1.0000 | LOZENGE | OROMUCOSAL | Status: DC | PRN

## 2015-03-07 MED ORDER — MIDAZOLAM HCL 2 MG/2ML IJ SOLN
INTRAMUSCULAR | Status: AC
  Filled 2015-03-07: qty 2

## 2015-03-07 MED ORDER — PHENYLEPHRINE HCL 10 MG/ML IJ SOLN
INTRAMUSCULAR | Status: DC | PRN
  Administered 2015-03-07: 80 ug via INTRAVENOUS

## 2015-03-07 MED ORDER — FERROUS SULFATE 325 (65 FE) MG PO TABS
325.0000 mg | ORAL_TABLET | Freq: Three times a day (TID) | ORAL | Status: DC
  Administered 2015-03-08 – 2015-03-09 (×4): 325 mg via ORAL
  Filled 2015-03-07 (×7): qty 1

## 2015-03-07 SURGICAL SUPPLY — 58 items
BAG DECANTER FOR FLEXI CONT (MISCELLANEOUS) ×2 IMPLANT
BAG SPEC THK2 15X12 ZIP CLS (MISCELLANEOUS)
BAG ZIPLOCK 12X15 (MISCELLANEOUS) IMPLANT
BANDAGE ELASTIC 6 VELCRO ST LF (GAUZE/BANDAGES/DRESSINGS) ×3 IMPLANT
BANDAGE ESMARK 6X9 LF (GAUZE/BANDAGES/DRESSINGS) ×1 IMPLANT
BLADE SAW SGTL 13.0X1.19X90.0M (BLADE) ×3 IMPLANT
BNDG CMPR 9X6 STRL LF SNTH (GAUZE/BANDAGES/DRESSINGS) ×1
BNDG ESMARK 6X9 LF (GAUZE/BANDAGES/DRESSINGS) ×3
BONE CEMENT GENTAMICIN (Cement) ×3 IMPLANT
BOWL SMART MIX CTS (DISPOSABLE) ×3 IMPLANT
CAP KNEE TOTAL 3 SIGMA ×2 IMPLANT
CEMENT BONE GENTAMICIN 40 (Cement) IMPLANT
CUFF TOURN SGL QUICK 34 (TOURNIQUET CUFF) ×3
CUFF TRNQT CYL 34X4X40X1 (TOURNIQUET CUFF) ×1 IMPLANT
DECANTER SPIKE VIAL GLASS SM (MISCELLANEOUS) ×3 IMPLANT
DRAPE EXTREMITY T 121X128X90 (DRAPE) ×3 IMPLANT
DRAPE POUCH INSTRU U-SHP 10X18 (DRAPES) ×3 IMPLANT
DRAPE U-SHAPE 47X51 STRL (DRAPES) ×3 IMPLANT
DRSG AQUACEL AG ADV 3.5X10 (GAUZE/BANDAGES/DRESSINGS) ×3 IMPLANT
DURAPREP 26ML APPLICATOR (WOUND CARE) ×4 IMPLANT
ELECT REM PT RETURN 9FT ADLT (ELECTROSURGICAL) ×3
ELECTRODE REM PT RTRN 9FT ADLT (ELECTROSURGICAL) ×1 IMPLANT
FACESHIELD WRAPAROUND (MASK) ×15 IMPLANT
FACESHIELD WRAPAROUND OR TEAM (MASK) ×5 IMPLANT
GLOVE BIOGEL PI IND STRL 7.5 (GLOVE) ×1 IMPLANT
GLOVE BIOGEL PI IND STRL 8.5 (GLOVE) ×1 IMPLANT
GLOVE BIOGEL PI INDICATOR 7.5 (GLOVE) ×2
GLOVE BIOGEL PI INDICATOR 8.5 (GLOVE) ×2
GLOVE ECLIPSE 8.0 STRL XLNG CF (GLOVE) ×5 IMPLANT
GLOVE ORTHO TXT STRL SZ7.5 (GLOVE) ×6 IMPLANT
GOWN SPEC L3 XXLG W/TWL (GOWN DISPOSABLE) ×3 IMPLANT
GOWN STRL REUS W/TWL LRG LVL3 (GOWN DISPOSABLE) ×3 IMPLANT
HANDPIECE INTERPULSE COAX TIP (DISPOSABLE) ×3
KIT BASIN OR (CUSTOM PROCEDURE TRAY) ×3 IMPLANT
LIQUID BAND (GAUZE/BANDAGES/DRESSINGS) ×3 IMPLANT
MANIFOLD NEPTUNE II (INSTRUMENTS) ×3 IMPLANT
NDL SAFETY ECLIPSE 18X1.5 (NEEDLE) ×1 IMPLANT
NEEDLE HYPO 18GX1.5 SHARP (NEEDLE) ×3
PACK TOTAL JOINT (CUSTOM PROCEDURE TRAY) ×3 IMPLANT
PEN SKIN MARKING BROAD (MISCELLANEOUS) ×3 IMPLANT
POSITIONER SURGICAL ARM (MISCELLANEOUS) ×3 IMPLANT
SET HNDPC FAN SPRY TIP SCT (DISPOSABLE) ×1 IMPLANT
SET PAD KNEE POSITIONER (MISCELLANEOUS) ×3 IMPLANT
SUCTION FRAZIER 12FR DISP (SUCTIONS) ×3 IMPLANT
SUT MNCRL AB 4-0 PS2 18 (SUTURE) ×3 IMPLANT
SUT VIC AB 1 CT1 36 (SUTURE) ×5 IMPLANT
SUT VIC AB 2-0 CT1 27 (SUTURE) ×9
SUT VIC AB 2-0 CT1 TAPERPNT 27 (SUTURE) ×3 IMPLANT
SUT VLOC 180 0 24IN GS25 (SUTURE) ×3 IMPLANT
SYR 50ML LL SCALE MARK (SYRINGE) ×3 IMPLANT
TOWEL OR 17X26 10 PK STRL BLUE (TOWEL DISPOSABLE) ×3 IMPLANT
TOWEL OR NON WOVEN STRL DISP B (DISPOSABLE) ×2 IMPLANT
TRAY FOLEY CATH 14FRSI W/METER (CATHETERS) ×1 IMPLANT
TRAY SLEEVE CEM ML (Knees) ×2 IMPLANT
TRAY TIB SZ 5 REVISION (Knees) ×2 IMPLANT
WATER STERILE IRR 1500ML POUR (IV SOLUTION) ×3 IMPLANT
WRAP KNEE MAXI GEL POST OP (GAUZE/BANDAGES/DRESSINGS) ×3 IMPLANT
YANKAUER SUCT BULB TIP 10FT TU (MISCELLANEOUS) ×3 IMPLANT

## 2015-03-07 NOTE — Op Note (Signed)
NAME:  [place] RECORD NO.:  [id number]                             FACILITY:  [facility]      PHYSICIAN:  Pietro Cassis. Alvan Dame, M.D.  DATE OF BIRTH:  07-22-1946      DATE OF PROCEDURE:  03/07/2015                                     OPERATIVE REPORT         PREOPERATIVE DIAGNOSIS:  Left knee osteoarthritis.      POSTOPERATIVE DIAGNOSIS:  Left knee osteoarthritis.      FINDINGS:  The patient was noted to have complete loss of cartilage and   bone-on-bone arthritis with associated osteophytes in all three compartments of   the knee with a significant synovitis and associated effusion.      PROCEDURE:  Left total knee replacement.      COMPONENTS USED:  De[employer] Sigma rotating platform posterior stabilized knee   system, a size 6 femur, 5 MBT revision tibial tray with a 93m cemented sleeve, 12.5 mm PS insert, and 41 patellar   button.      SURGEON:  MPietro Cassis OAlvan Dame M.D.      ASSISTANT:  MDanae Orleans PA-C.      ANESTHESIA:  General.      SPECIMENS:  None.      COMPLICATION:  None.      DRAINS:  None.  EBL: <100cc      TOURNIQUET TIME:   Total Tourniquet Time Documented: Thigh (Left) - 42 minutes Total: Thigh (Left) - 42 minutes  .      The patient was stable to the recovery room.      INDICATION FOR PROCEDURE:  JDAVIAN HANSHAWis a 69y.o. male patient of   mine.  The patient had been seen, evaluated, and treated conservatively in the   office with medication, activity modification, and injections.  The patient had   radiographic changes of bone-on-bone arthritis with endplate sclerosis and osteophytes noted.      The patient failed conservative measures including medication, injections, and activity modification, and at this point was ready for more definitive measures.   Based on the radiographic changes and failed conservative measures, the patient   decided to proceed with total knee replacement.  Risks of infection,   DVT, component  failure, need for revision surgery, postop course, and   expectations were all   discussed and reviewed.  Consent was obtained for benefit of pain   relief.      PROCEDURE IN DETAIL:  The patient was brought to the operative theater.   Once adequate anesthesia, preoperative antibiotics, 3 gm of Ancef, 144mof Decadron administered, the patient was positioned supine with the left thigh tourniquet placed.  The left lower extremity was prepped and draped in sterile fashion.  A time-   out was performed identifying the patient, planned procedure, and   extremity.      The left lower extremity [facility].  The leg was   exsanguinated, tourniquet elevated to 250 mmHg.  A midline incision was   made followed by median parapatellar arthrotomy.  Following initial   exposure, attention was first directed to the patella.  Precut   measurement was noted to be 25 mm.  I resected down to 14 mm and used a   41 patellar button to restore patellar height as well as cover the cut   surface.      The lug holes were drilled and a metal shim was placed to protect the   patella from retractors and saw blades.      At this point, attention was now directed to the femur.  The femoral   canal was opened with a drill, irrigated to try to prevent fat emboli.  An   intramedullary rod was passed at 5 degrees valgus, 10 mm of bone was   resected off the distal femur.  Following this resection, the tibia was   subluxated anteriorly.  Using the extramedullary guide, 4 mm of bone was resected off   the proximal medial tibia.  We confirmed the gap would be   stable medially and laterally with a 10 mm insert as well as confirmed   the cut was perpendicular in the coronal plane, checking with an alignment rod.      Once this was done, I sized the femur to be a size 6 in the anterior-   posterior dimension, chose a standard component based on medial and   lateral dimension.  The size 6 rotation  block was then pinned in   position anterior referenced using the C-clamp to set rotation.  The   anterior, posterior, and  chamfer cuts were made without difficulty nor   notching making certain that I was along the anterior cortex to help   with flexion gap stability.      The final box cut was made off the lateral aspect of distal femur.      At this point, the tibia was sized to be a size 5, the size 5 tray was   then pinned in position through the medial third of the tubercle,   drilled, and keel punched.  I did decide based on his bone quality to use a cemented sleeve as I had done on his other side.  Trial reduction was now carried with a 6 femur,  5 MBT revision tibial tray, a 10 then 12.5 mm insert, and the 41 patella botton.  The knee was brought to   extension, full extension with good flexion stability with the patella   tracking through the trochlea without application of pressure.  Given   all these findings, the trial components removed.  Final components were   opened and cement was mixed.  The knee was irrigated with normal saline   solution and pulse lavage.  The synovial lining was   then injected with 30cc of 0.25% Marcaine with epinephrine and 1 cc of Toradol plus 30cc of NS for a   total of 61 cc.      The knee was irrigated.  Final implants were then cemented onto clean and   dried cut surfaces of bone with the knee brought to extension with a 12.5 mm trial insert.      Once the cement had fully cured, the excess cement was removed   throughout the knee.  I confirmed I was satisfied with the range of   motion and stability, and the final 12.5 mm PS insert was chosen.  It was   placed into the knee.      The tourniquet had been  let down at 42 minutes.  No significant   hemostasis required.  The   extensor mechanism was then reapproximated using #1 Vicryl and #0 V-lock sutures with the knee   in flexion.  I injected the 2gm of Tranexamic Acid intra-articularly.   The   remaining wound was closed with 2-0 Vicryl and running 4-0 Monocryl.   The knee was cleaned, dried, dressed sterilely using Dermabond and   Aquacel dressing.  The patient was then   brought to recovery room in stable condition, tolerating the procedure   well.   Please note that Physician Assistant, Danae Orleans, PA-C, was present for the entirety of the case, and was utilized for pre-operative positioning, peri-operative retractor management, general facilitation of the procedure.  He was also utilized for primary wound closure at the end of the case.              Pietro Cassis Alvan Dame, M.D.    03/07/2015 1:05 PM

## 2015-03-07 NOTE — Interval H&P Note (Signed)
History and Physical Interval Note:  03/07/2015 8:54 AM  [name]  has presented today for surgery, with the diagnosis of LEFT KNEE OA   The various methods of treatment have been discussed with the patient and family. After consideration of risks, benefits and other options for treatment, the patient has consented to  Procedure(s): LEFT TOTAL KNEE ARTHROPLASTY (Left) as a surgical intervention .  The patient's history has been reviewed, patient examined, no change in status, stable for surgery.  I have reviewed the patient's chart and labs.  Questions were answered to the patient's satisfaction.     Mauri Pole

## 2015-03-07 NOTE — Anesthesia Preprocedure Evaluation (Addendum)
Anesthesia Evaluation  Patient identified by MRN, date of birth, ID band Patient awake    Reviewed: Allergy & Precautions, NPO status , Patient's Chart, lab work & pertinent test results  History of Anesthesia Complications Negative for: history of anesthetic complications  Airway Mallampati: II  TM Distance: >3 FB Neck ROM: Full    Dental  (+) Teeth Intact, Dental Advisory Given   Pulmonary sleep apnea , former smoker,    Pulmonary exam normal       Cardiovascular hypertension, + CAD and + Cardiac Stents     Neuro/Psych Anxiety Depression negative neurological ROS     GI/Hepatic GERD-  ,  Endo/Other  diabetes  Renal/GU Renal InsufficiencyRenal disease     Musculoskeletal   Abdominal   Peds  Hematology   Anesthesia Other Findings   Reproductive/Obstetrics                           Anesthesia Physical Anesthesia Plan  ASA: III  Anesthesia Plan: MAC and Spinal   Post-op Pain Management:    Induction: Intravenous  Airway Management Planned: Simple Face Mask  Additional Equipment:   Intra-op Plan:   Post-operative Plan:   Informed Consent: I have reviewed the patients History and Physical, chart, labs and discussed the procedure including the risks, benefits and alternatives for the proposed anesthesia with the patient or authorized representative who has indicated his/her understanding and acceptance.   Dental advisory given  Plan Discussed with: CRNA, Anesthesiologist and Surgeon  Anesthesia Plan Comments:        Anesthesia Quick Evaluation

## 2015-03-07 NOTE — Progress Notes (Signed)
Utilization review completed.  

## 2015-03-07 NOTE — Anesthesia Procedure Notes (Signed)
Procedure Name: LMA Insertion Date/Time: 03/07/2015 10:18 AM Performed by: Dione Booze Pre-anesthesia Checklist: Patient identified, Emergency Drugs available, Suction available and Patient being monitored Patient Re-evaluated:Patient Re-evaluated prior to inductionOxygen Delivery Method: Circle system utilized Preoxygenation: Pre-oxygenation with 100% oxygen Intubation Type: IV induction LMA: LMA with gastric port inserted LMA Size: 5.0 Number of attempts: 1 Placement Confirmation: positive ETCO2 and breath sounds checked- equal and bilateral Tube secured with: Tape Dental Injury: Teeth and Oropharynx as per pre-operative assessment

## 2015-03-07 NOTE — Plan of Care (Signed)
Problem: Consults Goal: Diagnosis- Total Joint Replacement Right total knee     

## 2015-03-07 NOTE — Transfer of Care (Signed)
Immediate Anesthesia Transfer of Care Note  Patient: [name]  Procedure(s) Performed: Procedure(s): LEFT TOTAL KNEE ARTHROPLASTY (Left)  Patient Location: PACU  Anesthesia Type:General  Level of Consciousness: sedated and patient cooperative  Airway & Oxygen Therapy: Patient Spontanous Breathing and Patient connected to face mask oxygen  Post-op Assessment: Report given to RN and Post -op Vital signs reviewed and stable  Post vital signs: Reviewed and stable  Last Vitals:  Filed Vitals:   03/07/15 0708  BP: 144/71  Pulse: 59  Temp: 36.3 C  Resp: 20    Complications: No apparent anesthesia complications

## 2015-03-07 NOTE — Progress Notes (Signed)
Unable to fit our largest TED'S on patient (states very uncomfortable), he states he hasn't taken his lasix in 2 days and swelling if patent on assessment. TED'S sent to or with patient.

## 2015-03-07 NOTE — Anesthesia Postprocedure Evaluation (Signed)
Anesthesia Post Note  Patient: [name]  Procedure(s) Performed: Procedure(s) (LRB): LEFT TOTAL KNEE ARTHROPLASTY (Left)  Anesthesia type: general  Patient location: PACU  Post pain: Pain level controlled  Post assessment: Patient's Cardiovascular Status Stable  Last Vitals:  Filed Vitals:   03/07/15 1300  BP: 146/64  Pulse: 57  Temp:   Resp: 10    Post vital signs: Reviewed and stable  Level of consciousness: sedated  Complications: No apparent anesthesia complications

## 2015-03-08 LAB — CBC
HCT: 28.7 % — ABNORMAL LOW (ref 39.0–52.0)
Hemoglobin: 9.3 g/dL — ABNORMAL LOW (ref 13.0–17.0)
MCH: 32.7 pg (ref 26.0–34.0)
MCHC: 32.4 g/dL (ref 30.0–36.0)
MCV: 101.1 fL — ABNORMAL HIGH (ref 78.0–100.0)
PLATELETS: 145 10*3/uL — AB (ref 150–400)
RBC: 2.84 MIL/uL — ABNORMAL LOW (ref 4.22–5.81)
RDW: 14.9 % (ref 11.5–15.5)
WBC: 8.4 10*3/uL (ref 4.0–10.5)

## 2015-03-08 LAB — BASIC METABOLIC PANEL
Anion gap: 10 (ref 5–15)
BUN: 47 mg/dL — ABNORMAL HIGH (ref 6–23)
CHLORIDE: 107 mmol/L (ref 96–112)
CO2: 24 mmol/L (ref 19–32)
CREATININE: 1.9 mg/dL — AB (ref 0.50–1.35)
Calcium: 8.4 mg/dL (ref 8.4–10.5)
GFR calc Af Amer: 40 mL/min — ABNORMAL LOW (ref >90)
GFR, EST NON AFRICAN AMERICAN: 34 mL/min — AB (ref >90)
Glucose, Bld: 141 mg/dL — ABNORMAL HIGH (ref 70–99)
POTASSIUM: 5.3 mmol/L — AB (ref 3.5–5.1)
Sodium: 141 mmol/L (ref 135–145)

## 2015-03-08 LAB — GLUCOSE, CAPILLARY
GLUCOSE-CAPILLARY: 149 mg/dL — AB (ref 70–99)
GLUCOSE-CAPILLARY: 133 mg/dL — AB (ref 70–99)
GLUCOSE-CAPILLARY: 103 mg/dL — AB (ref 70–99)
GLUCOSE-CAPILLARY: 166 mg/dL — AB (ref 70–99)

## 2015-03-08 MED ORDER — SODIUM CHLORIDE 0.9 % IV SOLN
INTRAVENOUS | Status: DC
  Administered 2015-03-08 (×2): via INTRAVENOUS

## 2015-03-08 NOTE — Progress Notes (Signed)
Clinical Social Work Department CLINICAL SOCIAL WORK PLACEMENT NOTE 03/08/2015  Patient:  RONDEL, EPISCOPO  Account Number:  [id number] Admit date:  03/07/2015  Clinical Social Worker:  Werner Lean, LCSW  Date/time:  03/08/2015 01:09 PM  Clinical Social Work is seeking post-discharge placement for this patient at the following level of care:   SKILLED NURSING   (*CSW will update this form in Epic as items are completed)     Patient/family provided with [facility] Work's list of facilities offering this level of care within the geographic area requested by the patient (or if unable, by the patient's family).  03/08/2015  Patient/family informed of their freedom to choose among providers that offer the needed level of care, that participate in Medicare, Medicaid or managed care program needed by the patient, have an available bed and are willing to accept the patient.  03/08/2015  Patient/family informed of [facility] ownership interest in [facility], as well as of the fact that they are under no obligation to receive care at this facility.  PASARR submitted to EDS on 03/07/2015 PASARR number received on 03/07/2015  FL2 transmitted to all facilities in geographic area requested by pt/family on  03/08/2015 FL2 transmitted to all facilities within larger geographic area on   Patient informed that his/her managed care company has contracts with or will negotiate with  certain facilities, including the following:     Patient/family informed of bed offers received:  03/08/2015 Patient chooses bed at [facility] Physician recommends and patient chooses bed at    Patient to be transferred to [facility] on   Patient to be transferred to facility by  Patient and family notified of transfer on  Name of family member notified:    The following physician request were entered in Epic:   Additional Comments:  Werner Lean  LCSW [phone]

## 2015-03-08 NOTE — Progress Notes (Signed)
Physical Therapy Treatment Patient Details Name: [name] MRN: [id number] DOB: [birth year] Today's Date: 03-16-2015    History of Present Illness L TKR    PT Comments    Pt progressing well with ambulation but continues to struggle with transfers sit<>stand  Follow Up Recommendations  SNF     Equipment Recommendations  None recommended by PT    Recommendations for Other Services OT consult     Precautions / Restrictions Precautions Precautions: Knee;Fall Required Braces or Orthoses: Knee Immobilizer - Left Knee Immobilizer - Left: Discontinue once straight leg raise with < 10 degree lag Restrictions Weight Bearing Restrictions: No Other Position/Activity Restrictions: WBAT    Mobility  Bed Mobility Overal bed mobility: Needs Assistance Bed Mobility: Sit to Supine       Sit to supine: Mod assist   General bed mobility comments: cues for sequence and use of R LE to self assist  Transfers Overall transfer level: Needs assistance Equipment used: Rolling walker (2 wheeled) Transfers: Sit to/from Stand Sit to Stand: Mod assist;+2 physical assistance         General transfer comment: cues for LE management and use of UEs to self assist  Ambulation/Gait Ambulation/Gait assistance: Min assist Ambulation Distance (Feet): 75 Feet (twice) Assistive device: Rolling walker (2 wheeled) Gait Pattern/deviations: Step-to pattern;Decreased step length - right;Decreased step length - left;Shuffle;Trunk flexed Gait velocity: decr   General Gait Details: cues for posture, sequence and position from [employer] Mobility    Modified Rankin (Stroke Patients Only)       Balance                                    Cognition Arousal/Alertness: Awake/alert Behavior During Therapy: WFL for tasks assessed/performed Overall Cognitive Status: Within Functional Limits for tasks assessed                       Exercises      General Comments        Pertinent Vitals/Pain Pain Assessment: 0-10 Pain Score: 4  Pain Location: L knee Pain Descriptors / Indicators: Aching;Sore Pain Intervention(s): Limited activity within patient's tolerance;Monitored during session;Premedicated before session;Ice applied    Home Living                      Prior Function            PT Goals (current goals can now be found in the care plan section) Acute Rehab PT Goals Patient Stated Goal: Rehab at [facility] and home to resume previous lifestyle with decreased pain PT Goal Formulation: With patient Time For Goal Achievement: 03/15/15 Potential to Achieve Goals: Good Progress towards PT goals: Progressing toward goals    Frequency  7X/week    PT Plan Current plan remains appropriate    Co-evaluation             End of Session Equipment Utilized During Treatment: Gait belt;Left knee immobilizer Activity Tolerance: Patient tolerated treatment well Patient left: in chair;with call bell/phone within reach     Time: 1503-1530 PT Time Calculation (min) (ACUTE ONLY): 27 min  Charges:  $Gait Training: 23-37 mins                    G Codes:      Tanja Gift 03/16/15,  4:49 PM

## 2015-03-08 NOTE — Discharge Instructions (Signed)
Information on my medicine - XARELTO (Rivaroxaban)  This medication education was reviewed with me or my healthcare representative as part of my discharge preparation.  The pharmacist that spoke with me during my hospital stay was:  Lolita Patella, [facility]  Why was Xarelto prescribed for you? Xarelto was prescribed for you to reduce the risk of blood clots forming after orthopedic surgery. The medical term for these abnormal blood clots is venous thromboembolism (VTE).  What do you need to know about xarelto ? Take your Xarelto ONCE DAILY at the same time every day. You may take it either with or without food.  If you have difficulty swallowing the tablet whole, you may crush it and mix in applesauce just prior to taking your dose.  Take Xarelto exactly as prescribed by your doctor and DO NOT stop taking Xarelto without talking to the doctor who prescribed the medication.  Stopping without other VTE prevention medication to take the place of Xarelto may increase your risk of developing a clot.  After discharge, you should have regular check-up appointments with your healthcare provider that is prescribing your Xarelto.    What do you do if you miss a dose? If you miss a dose, take it as soon as you remember on the same day then continue your regularly scheduled once daily regimen the next day. Do not take two doses of Xarelto on the same day.   Important Safety Information A possible side effect of Xarelto is bleeding. You should call your healthcare provider right away if you experience any of the following: ? Bleeding from an injury or your nose that does not stop. ? Unusual colored urine (red or dark brown) or unusual colored stools (red or black). ? Unusual bruising for unknown reasons. ? A serious fall or if you hit your head (even if there is no bleeding).  Some medicines may interact with Xarelto and might increase your risk of bleeding while on Xarelto. To help  avoid this, consult your healthcare provider or pharmacist prior to using any new prescription or non-prescription medications, including herbals, vitamins, non-steroidal anti-inflammatory drugs (NSAIDs) and supplements.  This website has more information on Xarelto: [email or web address]

## 2015-03-08 NOTE — Progress Notes (Signed)
OT Cancellation Note  Patient Details Name: [name] MRN: [id number] DOB: 06/20/1946   Cancelled Treatment:    Reason Eval/Treat Not Completed: OT screened, no needs identified, will sign off Pt plans to go to SNF to rehab.  Will defer OT eval to SNF  [place], Thereasa Parkin 03/08/2015, 12:34 PM

## 2015-03-08 NOTE — Evaluation (Signed)
Physical Therapy Evaluation Patient Details Name: [name] MRN: [id number] DOB: 07-30-1946 Today's Date: 03/08/2015   History of Present Illness  L TKR  Clinical Impression  Pt s/p L TKR presents with decreased L LE strength/ROM and post op pain limiting functional mobility.  Pt would benefit from follow up rehab at SNF level to maximize IND and safety prior to return home ALONE.    Follow Up Recommendations SNF    Equipment Recommendations  None recommended by PT    Recommendations for Other Services OT consult     Precautions / Restrictions Precautions Precautions: Knee;Fall Required Braces or Orthoses: Knee Immobilizer - Left Knee Immobilizer - Left: Discontinue once straight leg raise with < 10 degree lag Restrictions Weight Bearing Restrictions: No Other Position/Activity Restrictions: WBAT      Mobility  Bed Mobility Overal bed mobility: Needs Assistance;+2 for physical assistance Bed Mobility: Supine to Sit     Supine to sit: Mod assist;+2 for physical assistance;+2 for safety/equipment     General bed mobility comments: cues for sequence and use of R LE to self assist  Transfers Overall transfer level: Needs assistance Equipment used: Rolling walker (2 wheeled) Transfers: Sit to/from Stand Sit to Stand: From elevated surface;Mod assist;+2 physical assistance;+2 safety/equipment         General transfer comment: cues for LE management and use of UEs to self assist  Ambulation/Gait Ambulation/Gait assistance: Min assist;Mod assist Ambulation Distance (Feet): 50 Feet Assistive device: Rolling walker (2 wheeled) Gait Pattern/deviations: Step-to pattern;Decreased step length - right;Decreased step length - left;Shuffle;Trunk flexed Gait velocity: decr   General Gait Details: cues for posture, sequence and position from [employer] Mobility    Modified Rankin (Stroke Patients Only)       Balance                                              Pertinent Vitals/Pain Pain Assessment: 0-10 Pain Score: 5  Pain Location: L Knee Pain Descriptors / Indicators: Aching;Sore Pain Intervention(s): Limited activity within patient's tolerance;Monitored during session;Premedicated before session;Ice applied    Home Living Family/patient expects to be discharged to:: Skilled nursing facility Living Arrangements: Alone                    Prior Function Level of Independence: Independent;Independent with assistive device(s)               Hand Dominance        Extremity/Trunk Assessment   Upper Extremity Assessment: Overall WFL for tasks assessed           Lower Extremity Assessment: RLE deficits/detail;LLE deficits/detail RLE Deficits / Details: strength WFL with AROM knee flex ltd to ~80 LLE Deficits / Details: 2/5 quads with AAROM at knee -10-  60  Cervical / Trunk Assessment: Normal  Communication   Communication: No difficulties  Cognition Arousal/Alertness: Awake/alert Behavior During Therapy: WFL for tasks assessed/performed Overall Cognitive Status: Within Functional Limits for tasks assessed                      General Comments      Exercises Total Joint Exercises Ankle Circles/Pumps: AROM;10 reps;Supine;Both Quad Sets: AROM;Both;10 reps;Supine Heel Slides: AAROM;Left;15 reps;Supine Straight Leg Raises: AAROM;Left;Supine;10 reps      Assessment/Plan  PT Assessment Patient needs continued PT services  PT Diagnosis Difficulty walking   PT Problem List Decreased strength;Decreased range of motion;Decreased activity tolerance;Decreased mobility;Decreased knowledge of use of DME;Pain;Obesity  PT Treatment Interventions DME instruction;Gait training;Stair training;Functional mobility training;Therapeutic activities;Therapeutic exercise;Patient/family education   PT Goals (Current goals can be found in the Care Plan section) Acute Rehab PT  Goals Patient Stated Goal: Rehab at [facility] and home to resume previous lifestyle with decreased pain PT Goal Formulation: With patient Time For Goal Achievement: 03/15/15 Potential to Achieve Goals: Good    Frequency 7X/week   Barriers to discharge        Co-evaluation               End of Session Equipment Utilized During Treatment: Gait belt;Left knee immobilizer Activity Tolerance: Patient tolerated treatment well Patient left: in chair;with call bell/phone within reach Nurse Communication: Mobility status         Time: 3794-4461 PT Time Calculation (min) (ACUTE ONLY): 38 min   Charges:   PT Evaluation $Initial PT Evaluation Tier I: 1 Procedure PT Treatments $Gait Training: 8-22 mins $Therapeutic Exercise: 8-22 mins   PT G Codes:        [name] 2015-03-23, 1:11 PM

## 2015-03-08 NOTE — Clinical Documentation Improvement (Signed)
  MD's, NP's, and PA's   Admitted with diagnosis of OA if possible please provide type if known.  Thank you   Possible Conditions  Primary Osteoarthritis  Secondary Osteoarthritis  Posttraumatic  Erosive  Localized  Generalized  Other  Not able to determine  Risk Factors: OA Treatment: joint replacement surgery  Thank you,  Ree Kida ,RN Clinical Documentation Specialist:  [facility] [employer]

## 2015-03-08 NOTE — Progress Notes (Signed)
Clinical Social Work Department BRIEF PSYCHOSOCIAL ASSESSMENT 03/08/2015  Patient:  [name], [name]     Account Number:  [id number]     Admit date:  03/07/2015  Clinical Social Worker:  Lacie Scotts  Date/Time:  03/08/2015 12:52 PM  Referred by:  Physician  Date Referred:  03/08/2015 Referred for  SNF Placement   Other Referral:   Interview type:  Patient Other interview type:    PSYCHOSOCIAL DATA Living Status:  ALONE Admitted from facility:   Level of care:   Primary support name:  Jackson Latino Primary support relationship to patient:  SIBLING Degree of support available:   unclear    CURRENT CONCERNS Current Concerns  Post-Acute Placement   Other Concerns:    SOCIAL WORK ASSESSMENT / PLAN Pt is a 69 yr old gentleman living alone prior to hospitalization. CSW met with pt to assist with d/c planning. This is a planned admission. pt requesting ST Rehab at [facility]. SNF contacted an bed offer received. CSW will continue to follow to assist with d/c planning to SNF.   Assessment/plan status:  Psychosocial Support/Ongoing Assessment of Needs Other assessment/ plan:   Information/referral to community resources:   Insurance coverage for SNF and ambulance transport reviewed.    PATIENT'S/FAMILY'S RESPONSE TO PLAN OF CARE: Pt's mood is bright. " I'm feeling Ok. I got some sleep last night." Pt has been to [facility] in the past and had a good experience. " Pt is motivated to work with therapy.    Werner Lean LCSW [phone]

## 2015-03-08 NOTE — Progress Notes (Signed)
     Subjective: 1 Day Post-Op Procedure(s) (LRB): LEFT TOTAL KNEE ARTHROPLASTY (Left)   Patient reports pain as mild, pain controlled. No events throughout the night. States that he has been through this before with the other knee and looking forward to progressing with this knee.  Objective:   VITALS:   Filed Vitals:   03/08/15 1039  BP: 133/59  Pulse: 51  Temp: 98.2 F (36.8 C)  Resp: 14    Dorsiflexion/Plantar flexion intact Incision: dressing C/D/I No cellulitis present Compartment soft  LABS  Recent Labs  03/08/15 0522  HGB 9.3*  HCT 28.7*  WBC 8.4  PLT 145*     Recent Labs  03/08/15 0522  NA 141  K 5.3*  BUN 47*  CREATININE 1.90*  GLUCOSE 141*     Assessment/Plan: 1 Day Post-Op Procedure(s) (LRB): LEFT TOTAL KNEE ARTHROPLASTY (Left) Foley cath d/c'ed Advance diet Up with therapy D/C IV fluids Discharge to SNF eventually, when ready  Obese (BMI 30-39.9) Estimated body mass index is 37.12 kg/(m^2) as calculated from the following:   Height as of this encounter: 6' 3"  (1.905 m).   Weight as of this encounter: 134.718 kg (297 lb). Patient also counseled that weight may inhibit the healing process Patient counseled that losing weight will help with future health issues       West Pugh. Carvel Huskins   PAC  03/08/2015, 10:40 AM

## 2015-03-08 NOTE — Care Management Note (Signed)
    Page 1 of 1   03/08/2015     1:36:18 PM CARE MANAGEMENT NOTE 03/08/2015  Patient:  RABON, SCHOLLE   Account Number:  [id number]  Date Initiated:  03/08/2015  Documentation initiated by:  [facility]  Subjective/Objective Assessment:   adm: LEFT TOTAL KNEE ARTHROPLASTY (Left)     Action/Plan:   discharge planning   Anticipated DC Date:  03/09/2015   Anticipated DC Plan:  [place]  CM consult      Choice offered to / List presented to:             Status of service:  Completed, signed off Medicare Important Message given?   (If response is "NO", the following Medicare IM given date fields will be blank) Date Medicare IM given:   Medicare IM given by:   Date Additional Medicare IM given:   Additional Medicare IM given by:    Discharge Disposition:  [place]  Per UR Regulation:    If discussed at Long Length of Stay Meetings, dates discussed:    Comments:  03/08/15 13:30 CM notes pt to go to SNF for rehab; CSW arranging.  No other CM needs were communicated.  Mariane Masters, BSN, CM [phone].

## 2015-03-09 ENCOUNTER — Inpatient Hospital Stay
Admission: RE | Admit: 2015-03-09 | Discharge: 2015-03-24 | Disposition: A | Discharge status: Home / Self Care | Payer: Medicare Other | Source: Ambulatory Visit | Attending: Internal Medicine | Admitting: Internal Medicine

## 2015-03-09 DIAGNOSIS — R609 Edema, unspecified: Secondary | ICD-10-CM | POA: Diagnosis not present

## 2015-03-09 DIAGNOSIS — K5900 Constipation, unspecified: Secondary | ICD-10-CM | POA: Diagnosis not present

## 2015-03-09 DIAGNOSIS — R600 Localized edema: Secondary | ICD-10-CM | POA: Diagnosis not present

## 2015-03-09 DIAGNOSIS — R140 Abdominal distension (gaseous): Secondary | ICD-10-CM | POA: Diagnosis not present

## 2015-03-09 DIAGNOSIS — I2510 Atherosclerotic heart disease of native coronary artery without angina pectoris: Secondary | ICD-10-CM | POA: Diagnosis not present

## 2015-03-09 DIAGNOSIS — Z96652 Presence of left artificial knee joint: Secondary | ICD-10-CM | POA: Diagnosis not present

## 2015-03-09 DIAGNOSIS — E1121 Type 2 diabetes mellitus with diabetic nephropathy: Secondary | ICD-10-CM | POA: Diagnosis not present

## 2015-03-09 DIAGNOSIS — D649 Anemia, unspecified: Secondary | ICD-10-CM | POA: Diagnosis not present

## 2015-03-09 DIAGNOSIS — E119 Type 2 diabetes mellitus without complications: Secondary | ICD-10-CM | POA: Diagnosis not present

## 2015-03-09 DIAGNOSIS — M1990 Unspecified osteoarthritis, unspecified site: Secondary | ICD-10-CM | POA: Diagnosis not present

## 2015-03-09 DIAGNOSIS — I10 Essential (primary) hypertension: Secondary | ICD-10-CM | POA: Diagnosis not present

## 2015-03-09 DIAGNOSIS — R262 Difficulty in walking, not elsewhere classified: Secondary | ICD-10-CM | POA: Diagnosis not present

## 2015-03-09 DIAGNOSIS — G4733 Obstructive sleep apnea (adult) (pediatric): Secondary | ICD-10-CM | POA: Diagnosis not present

## 2015-03-09 DIAGNOSIS — M7989 Other specified soft tissue disorders: Secondary | ICD-10-CM | POA: Diagnosis not present

## 2015-03-09 DIAGNOSIS — M25562 Pain in left knee: Secondary | ICD-10-CM | POA: Diagnosis not present

## 2015-03-09 DIAGNOSIS — N183 Chronic kidney disease, stage 3 (moderate): Secondary | ICD-10-CM | POA: Diagnosis not present

## 2015-03-09 DIAGNOSIS — M6281 Muscle weakness (generalized): Secondary | ICD-10-CM | POA: Diagnosis not present

## 2015-03-09 DIAGNOSIS — I5032 Chronic diastolic (congestive) heart failure: Secondary | ICD-10-CM | POA: Diagnosis not present

## 2015-03-09 DIAGNOSIS — R278 Other lack of coordination: Secondary | ICD-10-CM | POA: Diagnosis not present

## 2015-03-09 DIAGNOSIS — S83105A Unspecified dislocation of left knee, initial encounter: Secondary | ICD-10-CM | POA: Diagnosis not present

## 2015-03-09 LAB — BASIC METABOLIC PANEL
ANION GAP: 6 (ref 5–15)
BUN: 49 mg/dL — ABNORMAL HIGH (ref 6–23)
CO2: 24 mmol/L (ref 19–32)
CREATININE: 1.75 mg/dL — AB (ref 0.50–1.35)
Calcium: 7.8 mg/dL — ABNORMAL LOW (ref 8.4–10.5)
Chloride: 107 mmol/L (ref 96–112)
GFR, EST AFRICAN AMERICAN: 44 mL/min — AB (ref >90)
GFR, EST NON AFRICAN AMERICAN: 38 mL/min — AB (ref >90)
GLUCOSE: 113 mg/dL — AB (ref 70–99)
POTASSIUM: 4.8 mmol/L (ref 3.5–5.1)
Sodium: 137 mmol/L (ref 135–145)

## 2015-03-09 LAB — CBC
HCT: 27.8 % — ABNORMAL LOW (ref 39.0–52.0)
Hemoglobin: 8.9 g/dL — ABNORMAL LOW (ref 13.0–17.0)
MCH: 32.5 pg (ref 26.0–34.0)
MCHC: 32 g/dL (ref 30.0–36.0)
MCV: 101.5 fL — ABNORMAL HIGH (ref 78.0–100.0)
Platelets: 125 10*3/uL — ABNORMAL LOW (ref 150–400)
RBC: 2.74 MIL/uL — AB (ref 4.22–5.81)
RDW: 15.5 % (ref 11.5–15.5)
WBC: 9 10*3/uL (ref 4.0–10.5)

## 2015-03-09 LAB — GLUCOSE, CAPILLARY: Glucose-Capillary: 107 mg/dL — ABNORMAL HIGH (ref 70–99)

## 2015-03-09 MED ORDER — POLYETHYLENE GLYCOL 3350 17 G PO PACK: 17.0000 g | PACK | Freq: Two times a day (BID) | ORAL | Status: DC

## 2015-03-09 MED ORDER — OXYCODONE HCL 5 MG PO TABS: 5.0000 mg | ORAL_TABLET | ORAL | Status: DC | PRN

## 2015-03-09 MED ORDER — DOCUSATE SODIUM 100 MG PO CAPS: 100.0000 mg | ORAL_CAPSULE | Freq: Two times a day (BID) | ORAL | Status: DC

## 2015-03-09 MED ORDER — FERROUS SULFATE 325 (65 FE) MG PO TABS: 325.0000 mg | ORAL_TABLET | Freq: Three times a day (TID) | ORAL | Status: DC

## 2015-03-09 MED ORDER — RIVAROXABAN 10 MG PO TABS: 10.0000 mg | ORAL_TABLET | ORAL | Status: DC

## 2015-03-09 MED ORDER — ASPIRIN EC 325 MG PO TBEC: 325.0000 mg | DELAYED_RELEASE_TABLET | Freq: Two times a day (BID) | ORAL | Status: AC

## 2015-03-09 MED ORDER — ALPRAZOLAM 0.5 MG PO TABS: ORAL_TABLET | ORAL | Status: DC

## 2015-03-09 NOTE — Progress Notes (Signed)
Discharged from floor via stretcher, EMT & belongings with pt. No changes in assessment. [name], [employer]

## 2015-03-09 NOTE — Progress Notes (Signed)
     Subjective: 2 Days Post-Op Procedure(s) (LRB): LEFT TOTAL KNEE ARTHROPLASTY (Left)   Patient reports pain as mild, pain controlled. No events throughout the night.  Feels that he is doing well and looking forward to progressing.  Ready to be discharged to skilled nursing facility.  Objective:   VITALS:   Filed Vitals:   03/09/15 0544  BP: 97/43  Pulse: 50  Temp: 97.5 F (36.4 C)  Resp: 16    Dorsiflexion/Plantar flexion intact Incision: dressing C/D/I No cellulitis present Compartment soft  LABS  Recent Labs  03/08/15 0522 03/09/15 0505  HGB 9.3* 8.9*  HCT 28.7* 27.8*  WBC 8.4 9.0  PLT 145* 125*     Recent Labs  03/08/15 0522 03/09/15 0505  NA 141 137  K 5.3* 4.8  BUN 47* 49*  CREATININE 1.90* 1.75*  GLUCOSE 141* 113*     Assessment/Plan: 2 Days Post-Op Procedure(s) (LRB): LEFT TOTAL KNEE ARTHROPLASTY (Left) Up with therapy Discharge to SNF  Follow up in 2 weeks at [facility]. Follow up with OLIN,Macrina Lehnert D in 2 weeks.  Contact information:  [facility] [address] [place] [phone]     Obese (BMI 30-39.9) Estimated body mass index is 37.12 kg/(m^2) as calculated from the following:  Height as of this encounter: 6' 3"  (1.905 m).  Weight as of this encounter: 134.718 kg (297 lb). Patient also counseled that weight may inhibit the healing process Patient counseled that losing weight will help with future health issues     [place] Pugh. Ching Rabideau   PAC  03/09/2015, 8:51 AM

## 2015-03-09 NOTE — Progress Notes (Signed)
Clinical Social Work Department CLINICAL SOCIAL WORK PLACEMENT NOTE 03/09/2015  Patient:  KAREN, KINNARD  Account Number:  [id number] Admit date:  03/07/2015  Clinical Social Worker:  Werner Lean, LCSW  Date/time:  03/09/2015 02:05 PM  Clinical Social Work is seeking post-discharge placement for this patient at the following level of care:   SKILLED NURSING   (*CSW will update this form in Epic as items are completed)     Patient/family provided with [facility] of Clinical Social Work's list of facilities offering this level of care within the geographic area requested by the patient (or if unable, by the patient's family).  03/08/2015  Patient/family informed of their freedom to choose among providers that offer the needed level of care, that participate in Medicare, Medicaid or managed care program needed by the patient, have an available bed and are willing to accept the patient.  03/08/2015  Patient/family informed of [facility]' ownership interest in [facility], as well as of the fact that they are under no obligation to receive care at this facility.  PASARR submitted to EDS on 03/07/2015 PASARR number received on 03/07/2015  FL2 transmitted to all facilities in geographic area requested by pt/family on  03/08/2015 FL2 transmitted to all facilities within larger geographic area on   Patient informed that his/her managed care company has contracts with or will negotiate with  certain facilities, including the following:     Patient/family informed of bed offers received:  03/08/2015 Patient chooses bed at [facility] Physician recommends and patient chooses bed at    Patient to be transferred to [facility] on  03/09/2015 Patient to be transferred to facility by  Patient and family notified of transfer on 03/09/2015 Name of family member notified:  CSW assistance declined.  The following physician request were entered in  Epic:   Additional Comments: Pt is in agreement with d/c to SNF today. PTAR transport required. Pt aware out of pocket costs may be associated with [facility] transport. NSG reviewed d/c summary, scripts, avs. Scripts included in d/c packet.  Werner Lean LCSW [phone]

## 2015-03-09 NOTE — Progress Notes (Signed)
Physical Therapy Treatment Patient Details Name: AMEL GIANINO MRN: [id number] DOB: 07-11-1946 Today's Date: 03/09/2015    History of Present Illness L TKR    PT Comments      Follow Up Recommendations  SNF     Equipment Recommendations  None recommended by PT    Recommendations for Other Services OT consult     Precautions / Restrictions Precautions Precautions: Knee;Fall Required Braces or Orthoses: Knee Immobilizer - Left Knee Immobilizer - Left: Discontinue once straight leg raise with < 10 degree lag Restrictions Weight Bearing Restrictions: No Other Position/Activity Restrictions: WBAT    Mobility  Bed Mobility Overal bed mobility: Needs Assistance Bed Mobility: Supine to Sit     Supine to sit: Mod assist;+2 for physical assistance;+2 for safety/equipment     General bed mobility comments: cues for sequence and use of R LE to self assist  Transfers Overall transfer level: Needs assistance Equipment used: Rolling walker (2 wheeled) Transfers: Sit to/from Stand Sit to Stand: Mod assist;From elevated surface         General transfer comment: cues for LE management and use of UEs to self assist  Ambulation/Gait Ambulation/Gait assistance: Min assist Ambulation Distance (Feet): 100 Feet Assistive device: Rolling walker (2 wheeled) Gait Pattern/deviations: Step-to pattern;Decreased step length - right;Decreased step length - left;Shuffle;Trunk flexed Gait velocity: decr   General Gait Details: cues for posture, sequence and position from [employer] Mob[employer]    Modified Rankin (Stroke Patients Only)       Balance                                    Cognition Arousal/Alertness: Awake/alert Behavior During Therapy: WFL for tasks assessed/performed Overall Cognitive Status: Within Functional Limits for tasks assessed                      Exercises Total Joint Exercises Ankle  Circles/Pumps: AROM;Supine;Both;20 reps Quad Sets: AROM;Both;Supine;20 reps Heel Slides: AAROM;Left;Supine;20 reps Straight Leg Raises: AAROM;Left;Supine;20 reps    General Comments        Pertinent Vitals/Pain Pain Assessment: 0-10 Pain Score: 7  Pain Location: Bil knees Pain Descriptors / Indicators: Aching;Sore Pain Intervention(s): Limited activity within patient's tolerance;Monitored during session;Premedicated before session;Ice applied;Patient requesting pain meds-RN notified    Home Living                      Prior Function            PT Goals (current goals can now be found in the care plan section) Acute Rehab PT Goals Patient Stated Goal: Rehab at [facility] and home to resume previous lifestyle with decreased pain PT Goal Formulation: With patient Time For Goal Achievement: 03/15/15 Potential to Achieve Goals: Good Progress towards PT goals: Progressing toward goals    Frequency  7X/week    PT Plan Current plan remains appropriate    Co-evaluation             End of Session Equipment Utilized During Treatment: Gait belt;Left knee immobilizer Activity Tolerance: Patient tolerated treatment well Patient left: in chair;with call bell/phone within reach     Time: 0847-0927 PT Time Calculation (min) (ACUTE ONLY): 40 min  Charges:  $Gait Training: 8-22 mins $Therapeutic Exercise: 23-37 mins  G Codes:      Suprina Mandeville 2015-04-02, 12:05 PM

## 2015-03-09 NOTE — Discharge Summary (Signed)
Physician Discharge Summary  Patient ID: [name] MRN: [id number] DOB/AGE: 1946-07-08 69 y.o.  Admit date: 03/07/2015 Discharge date:  03/09/2015  Procedures:  Procedure(s) (LRB): LEFT TOTAL KNEE ARTHROPLASTY (Left)  Attending Physician:  Dr. Paralee Cancel   Admission Diagnoses:   Left knee primary OA / pain  Discharge Diagnoses:  Principal Problem:   S/P left TKA Active Problems:   S/P right TKA   Obese  Past Medical History  Diagnosis Date  . Diastolic heart failure     Pedal edema, "a long time ago"  . Hypertension     Exercise induced  . NSVT (nonsustained ventricular tachycardia)     Exercise induced  . 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 Obesity   . Degenerative joint disease   . Benign prostatic hypertrophy     Nocturia  . Erectile dysfunction   . High cholesterol   . CAD (coronary artery disease)     a.  NSTEMI 10/2012 s/p DES to LAD & DES to 1st diagonal with residual diffuse nonobstructive dz in LCx/RCA   . Chronic kidney disease, stage 3, mod decreased GFR     Creatinine of 1.5 in 03/2009  . Diabetes mellitus, type II 12/10/2012.  Marland Kitchen Anxiety   . Depression     hx of  . Flu 2013    hx of  . Hemorrhoids   . Tubular adenoma 2014  . GERD (gastroesophageal reflux disease)     HPI:    [name], 69 y.o. male, has a history of pain and functional disability in the left knee due to arthritis and has failed non-surgical conservative treatments for greater than 12 weeks to include NSAID's and/or analgesics, use of assistive devices and activity modification. Onset of symptoms was gradual, starting 2+ years ago with gradually worsening course since that time. The patient noted prior procedures on the knee to include arthroplasty on the right knee per Dr. Alvan Dame on 11/15/2013. Patient currently rates pain in the left knee(s) at 8 out of 10 with activity. Patient has night pain, worsening of pain with activity and weight bearing,  pain that interferes with activities of daily living, pain with passive range of motion, crepitus and joint swelling. Patient has evidence of periarticular osteophytes and joint space narrowing by imaging studies. There is no active infection. Risks, benefits and expectations were discussed with the patient. Risks including but not limited to the risk of anesthesia, blood clots, nerve damage, blood vessel damage, failure of the prosthesis, infection and up to and including death. Patient understand the risks, benefits and expectations and wishes to proceed with surgery.  PCP: Collene Mares, PA-C   Discharged Condition: good  Hospital Course:  Patient underwent the above stated procedure on 03/07/2015. Patient tolerated the procedure well and brought to the recovery room in good condition and subsequently to the floor.  POD #1 BP: 133/59 ; Pulse: 51 ; Temp: 98.2 F (36.8 C) ; Resp: 14 Patient reports pain as mild, pain controlled. No events throughout the night. States that he has been through this before with the other knee and looking forward to progressing with this knee. Dorsiflexion/plantar flexion intact, incision: dressing C/D/I, no cellulitis present and compartment soft.   LABS  Basename    HGB  9.3  HCT  28.7   POD #2  BP: 97/43 ; Pulse: 50 ; Temp: 97.5 F (36.4 C) ; Resp: 16 Patient reports pain as mild, pain  controlled. No events throughout the night. Feels that he is doing well and looking forward to progressing. Ready to be discharged to skilled nursing facility. Dorsiflexion/plantar flexion intact, incision: dressing C/D/I, no cellulitis present and compartment soft.   LABS  Basename    HGB  8.9  HCT  27.8    Discharge Exam: General appearance: alert, cooperative and no distress Extremities: Homans sign is negative, no sign of DVT, no edema, redness or tenderness in the calves or thighs and no ulcers, gangrene or trophic changes  Disposition:   Skilled nursing  facility with follow up in 2 weeks   Follow-up Information    Follow up with Mauri Pole, MD. Schedule an appointment as soon as possible for a visit in 2 weeks.   Specialty:  Orthopedic Surgery   Contact information:   [address] [place] [address] [phone]       Discharge Instructions    Call MD / Call 911    Complete by:  As directed   If you experience chest pain or shortness of breath, CALL 911 and be transported to the hospital emergency room.  If you develope a fever above 101 F, pus (white drainage) or increased drainage or redness at the wound, or calf pain, call your surgeon's office.     Change dressing    Complete by:  As directed   Maintain surgical dressing until follow up in the clinic. If the edges start to pull up, may reinforce with tape. If the dressing is no longer working, may remove and cover with gauze and tape, but must keep the area dry and clean.  Call with any questions or concerns.     Constipation Prevention    Complete by:  As directed   Drink plenty of fluids.  Prune juice may be helpful.  You may use a stool softener, such as Colace (over the counter) 100 mg twice a day.  Use MiraLax (over the counter) for constipation as needed.     Diet - low sodium heart healthy    Complete by:  As directed      Discharge instructions    Complete by:  As directed   Maintain surgical dressing until follow up in the clinic. If the edges start to pull up, may reinforce with tape. If the dressing is no longer working, may remove and cover with gauze and tape, but must keep the area dry and clean.  Follow up in 2 weeks at [facility]. Call with any questions or concerns.     Increase activity slowly as tolerated    Complete by:  As directed      TED hose    Complete by:  As directed   Use stockings (TED hose) for 2 weeks on both leg(s).  You may remove them at night for sleeping.     Weight bearing as tolerated    Complete by:   As directed   Laterality:  left  Extremity:  Lower             Medication List    STOP taking these medications        aspirin 81 MG tablet  Replaced by:  aspirin EC 325 MG tablet     HYDROcodone-acetaminophen 10-325 MG per tablet  Commonly known as:  NORCO     ibuprofen 200 MG tablet  Commonly known as:  ADVIL,MOTRIN      TAKE these medications  acetaminophen 325 MG tablet  Commonly known as:  TYLENOL  Take 1-2 tablets (325-650 mg total) by mouth every 6 (six) hours as needed.     allopurinol 300 MG tablet  Commonly known as:  ZYLOPRIM  Take 300 mg by mouth every morning.     ALPRAZolam 0.5 MG tablet  Commonly known as:  XANAX  Take one tablet by mouth twice daily as needed for anxiety     amLODipine 10 MG tablet  Commonly known as:  NORVASC  Take 10 mg by mouth every morning.     aspirin EC 325 MG tablet  Take 1 tablet (325 mg total) by mouth 2 (two) times daily. Take for 4 weeks.  Start taking on:  03/24/2015     COLCRYS 0.6 MG tablet  Generic drug:  colchicine  Take 0.6 mg by mouth daily.     docusate sodium 100 MG capsule  Commonly known as:  COLACE  Take 1 capsule (100 mg total) by mouth 2 (two) times daily.     ferrous sulfate 325 (65 FE) MG tablet  Take 1 tablet (325 mg total) by mouth 3 (three) times daily after meals.     furosemide 40 MG tablet  Commonly known as:  LASIX  Take 60 mg by mouth every morning.     gabapentin 300 MG capsule  Commonly known as:  NEURONTIN  Take 300 mg by mouth 2 (two) times daily.     metoprolol 50 MG tablet  Commonly known as:  LOPRESSOR  Take 50 mg by mouth 2 (two) times daily.     NITROSTAT 0.4 MG SL tablet  Generic drug:  nitroGLYCERIN  DISSOLVE ONE TABLET UNDER THE TONGUE AS NEEDED FOR CHEST PAIN.     omeprazole 20 MG capsule  Commonly known as:  PRILOSEC  Take 20 mg by mouth every morning.     oxyCODONE 5 MG immediate release tablet  Commonly known as:  Oxy IR/ROXICODONE  Take 1-3 tablets  (5-15 mg total) by mouth every 4 (four) hours as needed for severe pain.     polyethylene glycol packet  Commonly known as:  MIRALAX / GLYCOLAX  Take 17 g by mouth 2 (two) times daily.     pravastatin 20 MG tablet  Commonly known as:  PRAVACHOL  Take 20 mg by mouth every morning.     rivaroxaban 10 MG Tabs tablet  Commonly known as:  XARELTO  Take 1 tablet (10 mg total) by mouth daily.     saxagliptin HCl 2.5 MG Tabs tablet  Commonly known as:  ONGLYZA  Take 2.5 mg by mouth every morning.         Signed: West Pugh. Yamilett Anastos   PA-C  03/09/2015, 8:59 AM

## 2015-03-09 NOTE — Plan of Care (Signed)
Problem: Discharge Progression Outcomes Goal: Anticoagulant follow-up in place Outcome: Not Applicable Date Met:  84/83/50 Alveda Reasons

## 2015-03-10 ENCOUNTER — Non-Acute Institutional Stay (SKILLED_NURSING_FACILITY): Payer: Medicare Other | Admitting: Internal Medicine

## 2015-03-10 ENCOUNTER — Other Ambulatory Visit (HOSPITAL_COMMUNITY)
Admission: AD | Admit: 2015-03-10 | Discharge: 2015-03-10 | Disposition: A | Discharge status: Home / Self Care | Payer: Medicare Other | Source: Skilled Nursing Facility | Attending: Internal Medicine | Admitting: Internal Medicine

## 2015-03-10 DIAGNOSIS — I10 Essential (primary) hypertension: Secondary | ICD-10-CM | POA: Diagnosis not present

## 2015-03-10 DIAGNOSIS — N183 Chronic kidney disease, stage 3 unspecified: Secondary | ICD-10-CM

## 2015-03-10 DIAGNOSIS — I5032 Chronic diastolic (congestive) heart failure: Secondary | ICD-10-CM

## 2015-03-10 DIAGNOSIS — R609 Edema, unspecified: Secondary | ICD-10-CM | POA: Diagnosis not present

## 2015-03-10 DIAGNOSIS — Z96652 Presence of left artificial knee joint: Secondary | ICD-10-CM | POA: Diagnosis not present

## 2015-03-10 LAB — BASIC METABOLIC PANEL
ANION GAP: 7 (ref 5–15)
BUN: 42 mg/dL — ABNORMAL HIGH (ref 6–23)
CHLORIDE: 108 mmol/L (ref 96–112)
CO2: 26 mmol/L (ref 19–32)
CREATININE: 1.53 mg/dL — AB (ref 0.50–1.35)
Calcium: 8.4 mg/dL (ref 8.4–10.5)
GFR calc Af Amer: 52 mL/min — ABNORMAL LOW (ref >90)
GFR calc non Af Amer: 45 mL/min — ABNORMAL LOW (ref >90)
Glucose, Bld: 122 mg/dL — ABNORMAL HIGH (ref 70–99)
POTASSIUM: 4.3 mmol/L (ref 3.5–5.1)
Sodium: 141 mmol/L (ref 135–145)

## 2015-03-10 NOTE — Progress Notes (Signed)
Patient ID: [name], male   DOB: Jan 18, 1946, 69 y.o.   MRN: [id number]   This is an acute visit.  Level care skilled.  Facility [facility].  Chief complaint acute visit status post hospitalization for left TKA-follow-up edema area  History of present illness.  Patient is a 69 year old male who is here for short-term rehabilitation after undergoing a left knee replacement secondary to end-stage osteoarthritis which failed conservative therapy.  He apparently tolerated the procedure without complication and again is here for rehabilitation-he has numerous medical issues most prominently's history of edema-he says this has increased recently--he is currently on Lasix 60 mg a day-with a history of grade 1 diastolic dysfunction per review of an echo done in November 2014 his ejection fraction is 50-55 percent He is followed  by cardiology  Per review of cardiology note from last month he had been on Lasix 40 mg a day previously at some point apparently this was increased.  His weight today is 305 according to the scales it was of 310 yesterday-according to cardiology note reviewed on 02/07/2015 listed weight was 265 although I'm not sure if that was his current weight at the time.  He does not complain of any shortness of breath or chest pain or cough-.  Vital signs appear to be stable.  Previous medical history.  History of left knee osteoarthritis status post replacement.  Previous history of right TKA.  Gout.  Cellulitis.  Blood loss anemia.  Obesity.  Diabetes type 2.  Arthrosclerotic cardiovascular disease.  Sleep apnea.  Chronic any disease.  Hypertension.  Diastolic CHF.  History of nonsustained ventricular tachycardia.  History of tobacco use 30-pack-years he quit in 1994.  The pH.  Erectile dysfunction.  Hyperlipidemia.  Anxiety.  Depression.  History of tubular adenoma.  Hemorrhoids.  Previous surgical history.  Cardiac  catheterization with stent placement.  Back surgery 2.  Hernia repair.  Total knee arthroplasty 2.  Left heart catheterization with coronary angiogram.  History of percutaneous coronary stent intervention.  Family medical social history.  Patient is an smoker with a 30-pack-year history he quit in January 1994-no history of smokeless tobacco or significant alcohol use.  Family history of hypertension and cancer negative history for colon cancer.  Medications  Tylenol 325 mg-take 1 or 2 tabs every 6 hours when necessary.  Allopurinol 300 mg every morning.  Xanax 0.5 mg twice a day when necessary anxiety.  Norvasc 10 mg every morning.  Colchicine 0.6 mg daily.  Colace 100 mg twice a day.  Ferrous sulfate 325 mg 3 times a day.  Lasix 60 mg every morning.  Neurontin 300 mg twice a day.  Lopressor 50 mg twice a day.  Nitrostat when necessary.  Prilosec 20 mg every morning.  OxyIR 5-15 milligrams every 4 hours when necessary severe pain.  MiraLAX 3 times a day.  Pravastatin 20 mg every morning.  Xarelto 10 mg daily until 03/24/2015 then start aspirin enteric-coated 325 mg twice a day for 4 weeks  .  Onglyza 2.5 mg every morning.  Review of systems.  General does not complain of any fever or chills appears she's possibly gained some weight although this is moderated during his stay here.  Skin does not complain of rashes or itching.  Head ears eyes nose mouth and throat is not complaining of any sore throat visual changes.  Respiratory is not complaining of any shortness breath or cough.  Cardiac does not complain of chest pain has a history of  diastolic CHF-has edema.  GI does not complain of nausea vomiting diarrhea constipation or abdominal discomfort at this time.  GU is not complaining of dysuria.  Musculoskeletal at this point knee pain appears to be controlled he does have a history of gout as well does not complain of specific joint pain  however.  Neurologic does not complain of dizziness headache or numbness currently.  Psych he does have a history of anxiety and depression at this point appears to be baseline does not appear to be acutely anxious or depressed and does not complain of this.  Physical exam.  Temperature is 97.9 pulse 64 respirations 20 blood pressure 144/72-123/68-weight today is 305.5 yesterday was 310.5.  In general this is an elderly male in no distress sitting comfortably in his wheelchair.  His skin is warm and dry there is dressing applied per orthopedics over his left knee there is some warmth to the knee I do not see increased erythema or drainage from what I can ascertain this evening this appears to be typical postop presentation.  Head ears eyes nose mouth and throat-pupils appear reactive to light sclera and conjunctiva are clear daily appears intact.  Oropharynx clear mucous membranes moist.  His chest is clear to auscultation there is somewhat shallow air entry no labored breathing.  Heart is regular rate and rhythm without murmur gallop or rub.--He appears to have venous stasis changes 1-2 plus edema of his legs--2+ of his feet bilaterally-- a bit more  edema on the left status post surgery  reduced pedal pulses I suspect this is due to the edema  Abdomen is obese soft nontender with positive bowel sounds.  GU was difficult to assess secondary to patient positioning and wheelchair from what I could ascertain appear to be normal limits he is voiding.  Musculoskeletal as stated above left knee area-appears to have edema venous stasis changes ambulates in a wheelchair his left leg is currently elevated in the wheelchair I do not note any deformities strength appears to be intact on right leg and upper extremities bilaterally status post.  Neurologic is grossly intact I could not appreciate any lateralizing findings his speech is clear.  Psych he is alert and oriented pleasant and  appropriate  Labs.  03/10/2015.  Sodium 141 potassium 4.3 BUN 42 creatinine 1.53.  03/09/2015.  WBC 9.0 hemoglobin 8.9 platelets 625.  At that time his creatinine was 1.75 BUN 49.  On March 16 his creatinine was 1.9 BUN 47.  Assessment plan.  #1-status post left knee replacement-this appears to be stable clinically-he is receiving OxyIR as needed for pain continues on Xarelto currently for anticoagulation which will be switched to aspirin at the beginning of April.  #2-history of edema with what appears to diastolic CHF-she is followed by cardiology he does appear to have some increased edema-Will increase his Lasix to 80 mg a day will give him an extra 20 mg a Lasix this evening-continue to monitor weights closely and notify provider of any gain greater than 3 pounds-  He is on a beta blocker as well as a statin.  With history of renal insufficiency Will have to watch his renal function update a basic metabolic panel on Sunday, March 20-also will check a chest x-ray tomorrow morning to monitor for any pulmonary edema or signs of CHF.  Clinically he appears to be stable.  #3-history of anemia thought to be postop he is on iron Will check a CBC as well as on Sunday.  #4-history of  hypertension he is on Norvasc 10 mg a day recent blood pressures 144/72-123/68 at this point will continue to monitor.--Note he is also on Lopressor 50 mg twice a day.  #5 history of gout he had issues with this during his previous stay here at this point appears clinically stable he is on allopurinol as well as colchicine.  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  #7-history of diabetes type 2-she continues on Onglyza so far CBGs have been satisfactory in the low mid 100s.  #8--history of coronary artery disease-again he is on hypertension medication including Lopressor-Norvasc-he is also on a statin as well as anticoagulation  currently on Xarelto-he does have Nitrostat as needed  CPT-99310-of note greater than 45 minutes spent assessing patient-extensive review of his chart including reviewing Epic-and coordinating and formulating a plan of care for numerous diagnoses-of note greater than 50% of time spent coordinating plan of care  #5-.

## 2015-03-11 ENCOUNTER — Inpatient Hospital Stay (HOSPITAL_COMMUNITY): Payer: Medicare Other | Attending: Internal Medicine

## 2015-03-11 ENCOUNTER — Encounter: Payer: Self-pay | Admitting: Internal Medicine

## 2015-03-11 DIAGNOSIS — I5032 Chronic diastolic (congestive) heart failure: Secondary | ICD-10-CM | POA: Insufficient documentation

## 2015-03-11 DIAGNOSIS — M7989 Other specified soft tissue disorders: Secondary | ICD-10-CM | POA: Diagnosis not present

## 2015-03-11 DIAGNOSIS — R609 Edema, unspecified: Secondary | ICD-10-CM | POA: Insufficient documentation

## 2015-03-12 ENCOUNTER — Encounter (HOSPITAL_COMMUNITY)
Admission: RE | Admit: 2015-03-12 | Discharge: 2015-03-12 | Disposition: A | Discharge status: Home / Self Care | Payer: Medicare Other | Source: Skilled Nursing Facility | Attending: Internal Medicine | Admitting: Internal Medicine

## 2015-03-12 LAB — CBC
HEMATOCRIT: 27.1 % — AB (ref 39.0–52.0)
Hemoglobin: 8.9 g/dL — ABNORMAL LOW (ref 13.0–17.0)
MCH: 33.1 pg (ref 26.0–34.0)
MCHC: 32.8 g/dL (ref 30.0–36.0)
MCV: 100.7 fL — ABNORMAL HIGH (ref 78.0–100.0)
Platelets: 176 10*3/uL (ref 150–400)
RBC: 2.69 MIL/uL — AB (ref 4.22–5.81)
RDW: 15 % (ref 11.5–15.5)
WBC: 6.8 10*3/uL (ref 4.0–10.5)

## 2015-03-12 LAB — BASIC METABOLIC PANEL
Anion gap: 8 (ref 5–15)
BUN: 47 mg/dL — ABNORMAL HIGH (ref 6–23)
CO2: 26 mmol/L (ref 19–32)
Calcium: 8.6 mg/dL (ref 8.4–10.5)
Chloride: 105 mmol/L (ref 96–112)
Creatinine, Ser: 1.82 mg/dL — ABNORMAL HIGH (ref 0.50–1.35)
GFR, EST AFRICAN AMERICAN: 42 mL/min — AB (ref >90)
GFR, EST NON AFRICAN AMERICAN: 36 mL/min — AB (ref >90)
Glucose, Bld: 120 mg/dL — ABNORMAL HIGH (ref 70–99)
POTASSIUM: 4.2 mmol/L (ref 3.5–5.1)
SODIUM: 139 mmol/L (ref 135–145)

## 2015-03-13 ENCOUNTER — Non-Acute Institutional Stay (SKILLED_NURSING_FACILITY): Payer: Medicare Other | Admitting: Internal Medicine

## 2015-03-13 DIAGNOSIS — R600 Localized edema: Secondary | ICD-10-CM | POA: Diagnosis not present

## 2015-03-13 DIAGNOSIS — Z96652 Presence of left artificial knee joint: Secondary | ICD-10-CM

## 2015-03-13 DIAGNOSIS — N183 Chronic kidney disease, stage 3 (moderate): Secondary | ICD-10-CM | POA: Diagnosis not present

## 2015-03-13 DIAGNOSIS — E1121 Type 2 diabetes mellitus with diabetic nephropathy: Secondary | ICD-10-CM | POA: Diagnosis not present

## 2015-03-13 LAB — GLUCOSE, CAPILLARY: Glucose-Capillary: 116 mg/dL — ABNORMAL HIGH (ref 70–99)

## 2015-03-15 ENCOUNTER — Inpatient Hospital Stay (HOSPITAL_COMMUNITY)
Admit: 2015-03-15 | Discharge: 2015-03-15 | Disposition: A | Discharge status: Home / Self Care | Payer: Medicare Other | Attending: Internal Medicine | Admitting: Internal Medicine

## 2015-03-15 ENCOUNTER — Non-Acute Institutional Stay (SKILLED_NURSING_FACILITY): Payer: Medicare Other | Admitting: Internal Medicine

## 2015-03-15 ENCOUNTER — Other Ambulatory Visit (HOSPITAL_COMMUNITY)
Admission: AD | Admit: 2015-03-15 | Discharge: 2015-03-15 | Disposition: A | Discharge status: Home / Self Care | Payer: Medicare Other | Source: Skilled Nursing Facility | Attending: Internal Medicine | Admitting: Internal Medicine

## 2015-03-15 DIAGNOSIS — E1121 Type 2 diabetes mellitus with diabetic nephropathy: Secondary | ICD-10-CM

## 2015-03-15 DIAGNOSIS — D649 Anemia, unspecified: Secondary | ICD-10-CM | POA: Diagnosis not present

## 2015-03-15 DIAGNOSIS — R140 Abdominal distension (gaseous): Secondary | ICD-10-CM | POA: Diagnosis not present

## 2015-03-15 DIAGNOSIS — K5900 Constipation, unspecified: Secondary | ICD-10-CM | POA: Diagnosis not present

## 2015-03-15 LAB — BASIC METABOLIC PANEL
Anion gap: 10 (ref 5–15)
BUN: 57 mg/dL — ABNORMAL HIGH (ref 6–23)
CALCIUM: 8.8 mg/dL (ref 8.4–10.5)
CO2: 27 mmol/L (ref 19–32)
CREATININE: 2.07 mg/dL — AB (ref 0.50–1.35)
Chloride: 104 mmol/L (ref 96–112)
GFR calc Af Amer: 36 mL/min — ABNORMAL LOW (ref >90)
GFR, EST NON AFRICAN AMERICAN: 31 mL/min — AB (ref >90)
Glucose, Bld: 116 mg/dL — ABNORMAL HIGH (ref 70–99)
Potassium: 4.4 mmol/L (ref 3.5–5.1)
SODIUM: 141 mmol/L (ref 135–145)

## 2015-03-15 LAB — CBC WITH DIFFERENTIAL/PLATELET
BASOS PCT: 0 % (ref 0–1)
Basophils Absolute: 0 10*3/uL (ref 0.0–0.1)
Eosinophils Absolute: 0.3 10*3/uL (ref 0.0–0.7)
Eosinophils Relative: 4 % (ref 0–5)
HEMATOCRIT: 25 % — AB (ref 39.0–52.0)
Hemoglobin: 8.1 g/dL — ABNORMAL LOW (ref 13.0–17.0)
LYMPHS PCT: 22 % (ref 12–46)
Lymphs Abs: 1.3 10*3/uL (ref 0.7–4.0)
MCH: 32.8 pg (ref 26.0–34.0)
MCHC: 32.4 g/dL (ref 30.0–36.0)
MCV: 101.2 fL — ABNORMAL HIGH (ref 78.0–100.0)
MONOS PCT: 12 % (ref 3–12)
Monocytes Absolute: 0.7 10*3/uL (ref 0.1–1.0)
NEUTROS PCT: 63 % (ref 43–77)
Neutro Abs: 3.9 10*3/uL (ref 1.7–7.7)
Platelets: 245 10*3/uL (ref 150–400)
RBC: 2.47 MIL/uL — ABNORMAL LOW (ref 4.22–5.81)
RDW: 14.5 % (ref 11.5–15.5)
WBC: 6.2 10*3/uL (ref 4.0–10.5)

## 2015-03-15 NOTE — Progress Notes (Signed)
Patient ID: [name], male   DOB: [birth year], 69 y.o.   MRN: [id number]                 HISTORY & PHYSICAL  DATE:  03/13/2015         FACILITY: [facility]                LEVEL OF CARE:   SNF   CHIEF COMPLAINT:  Admission to SNF, post stay at [facility], 03/07/2015 through 03/09/2015.        HISTORY OF PRESENT ILLNESS:   This is a functionally independent, 69 year-old man who was admitted electively due to a left primary knee osteoarthritis for a left total knee replacement.   He has a fairly extensive medical problem list but, as far as I am able to tell, he tolerated the procedure fairly well.    PAST MEDICAL HISTORY/PROBLEM LIST:                       Generalized muscle weakness.    Gout.    Blood loss anemia postoperatively.     Obesity.    History of right total knee replacement.    Diabetes.    History of cardiovascular disease.    Obstructive sleep apnea.    Chronic renal failure stage III.    Hypertension.    Diastolic heart failure.    Nonsustained ventricular tachycardia.    BPH.    Hypercholesterolemia.    Coronary artery disease with last cardiac cath in 2013.     History of depression.     PAST SURGICAL HISTORY:           Cardiac catheterization.    Back surgery.    Hernia surgery.    Total right knee arthroplasty.    Colonoscopy with EGD showing left-sided colitis, moderate internal hemorrhoids.    Left heart catheterization.    CURRENT MEDICATIONS:  Discharge medications include:     Tylenol 650 q.6 p.r.n.        Allopurinol 300 mg daily.    Xanax 0.5 b.i.d. p.r.n.    Norvasc 10 q.d.       ASA 325 b.i.d. for four weeks.    Colchicine 0.6 q.d.     Ferrous sulfate 325 three times a day.    Lasix 60 q.d.         Neurontin 300 a day.      Metoprolol 50 b.i.d.                  Nitrostat p.r.n.          Prilosec 20 q.d.             Oxycodone 2 tablets q.4 p.r.n.         MiraLAX 17 g b.i.d.           Pravachol 20 q.d.          Xarelto 10 mg daily.     Onglyza 2.5 q.a.m.                        SOCIAL HISTORY:                   HOUSING:  The patient tells me he lives in [place] on his own.   FUNCTIONAL STATUS:  Independent with ADLs and IADLs.  Does not appear to be any functional abnormalities.    REVIEW OF SYSTEMS:  CHEST/RESPIRATORY:  No shortness of breath.     CARDIAC:  No chest pain.    GI:  Complains of constipation.   GU:  No dysuria.     PHYSICAL EXAMINATION:   GENERAL APPEARANCE:  The patient is not in any distress.   CHEST/RESPIRATORY:  Clear air entry bilaterally.     CARDIOVASCULAR:   CARDIAC:  Heart sounds are normal.  There are no murmurs.  No bruits.   GASTROINTESTINAL:   ABDOMEN:  Slightly distended.  No masses.     LIVER/SPLEEN/KIDNEY:   No liver, no spleen.  No tenderness.    GENITOURINARY:   BLADDER:  Not distended.   MUSCULOSKELETAL:    EXTREMITIES:   LEFT LOWER EXTREMITY:  He has a surgical dressing on his left knee that I did not remove.   There is fluid on the left knee.   CIRCULATION:   EDEMA/VARICOSITIES:  He has swelling of his left lower leg with some degree of chronic venous insufficiency, although there is a lot more edema in the left leg than the right.    ASSESSMENT/PLAN:                          Status post elective left total knee replacement.   Postoperatively, he appears to have done well.  He is discharged on both enteric-coated aspirin and Xarelto.  I will need to try and sort this out.  He had postoperative anemia with a discharge hemoglobin of 8.9.    Type 2 diabetes with chronic renal insufficiency.  His creatinine is in the 1.7 to 1.9 range.  I do not see a recent hemoglobin A1c on him.    History of coronary artery disease.  His last echocardiogram was in November 2014.  His EF was 50-55% with hypokinesis in the inferolateral myocardium.  He is on a high dose of Lasix.  We will follow this.  His BUN and creatinine  today are 47 and 1.82, respectively.  This is, I think, fairly stable from what I see his baseline values to be.  Estimated GFR at 42.    Left lower extremity edema.  This is really quite extensive, involving the left foot and left lower leg.  There is tenderness here.  He has lost a fair amount of hemoglobin.  I suspect this is what this is.  He is on both Xarelto and aspirin.  I will need to make sure this is being given correctly in the facility.   If I am reading things right, the Xarelto is for two weeks and then the aspirin for four weeks.      CPT CODE: [id number]

## 2015-03-16 ENCOUNTER — Other Ambulatory Visit: Payer: Self-pay | Admitting: *Deleted

## 2015-03-16 MED ORDER — OXYCODONE HCL 5 MG PO TABS: 5.0000 mg | ORAL_TABLET | ORAL | Status: DC | PRN

## 2015-03-16 NOTE — Telephone Encounter (Signed)
[facility] 

## 2015-03-21 ENCOUNTER — Other Ambulatory Visit: Payer: Self-pay

## 2015-03-21 MED ORDER — OXYCODONE-ACETAMINOPHEN 5-325 MG PO TABS: ORAL_TABLET | ORAL | Status: DC

## 2015-03-21 NOTE — Telephone Encounter (Signed)
RX faxed to [facility] @ [phone]. Phone number [phone]

## 2015-03-22 ENCOUNTER — Other Ambulatory Visit (HOSPITAL_COMMUNITY)
Admission: AD | Admit: 2015-03-22 | Discharge: 2015-03-22 | Disposition: A | Discharge status: Home / Self Care | Payer: Medicare Other | Source: Skilled Nursing Facility | Attending: Internal Medicine | Admitting: Internal Medicine

## 2015-03-22 LAB — BASIC METABOLIC PANEL
Anion gap: 6 (ref 5–15)
BUN: 49 mg/dL — ABNORMAL HIGH (ref 6–23)
CO2: 26 mmol/L (ref 19–32)
Calcium: 9 mg/dL (ref 8.4–10.5)
Chloride: 104 mmol/L (ref 96–112)
Creatinine, Ser: 2.13 mg/dL — ABNORMAL HIGH (ref 0.50–1.35)
GFR calc Af Amer: 35 mL/min — ABNORMAL LOW (ref >90)
GFR calc non Af Amer: 30 mL/min — ABNORMAL LOW (ref >90)
Glucose, Bld: 112 mg/dL — ABNORMAL HIGH (ref 70–99)
Potassium: 4.8 mmol/L (ref 3.5–5.1)
Sodium: 136 mmol/L (ref 135–145)

## 2015-03-22 LAB — CBC
HCT: 25.3 % — ABNORMAL LOW (ref 39.0–52.0)
Hemoglobin: 8.1 g/dL — ABNORMAL LOW (ref 13.0–17.0)
MCH: 32.3 pg (ref 26.0–34.0)
MCHC: 32 g/dL (ref 30.0–36.0)
MCV: 100.8 fL — ABNORMAL HIGH (ref 78.0–100.0)
Platelets: 373 10*3/uL (ref 150–400)
RBC: 2.51 MIL/uL — ABNORMAL LOW (ref 4.22–5.81)
RDW: 14.3 % (ref 11.5–15.5)
WBC: 5.9 10*3/uL (ref 4.0–10.5)

## 2015-03-24 ENCOUNTER — Encounter: Payer: Self-pay | Admitting: Internal Medicine

## 2015-03-24 ENCOUNTER — Non-Acute Institutional Stay (SKILLED_NURSING_FACILITY): Payer: Medicare Other | Admitting: Internal Medicine

## 2015-03-24 DIAGNOSIS — D649 Anemia, unspecified: Secondary | ICD-10-CM

## 2015-03-24 DIAGNOSIS — I10 Essential (primary) hypertension: Secondary | ICD-10-CM | POA: Diagnosis not present

## 2015-03-24 DIAGNOSIS — N183 Chronic kidney disease, stage 3 unspecified: Secondary | ICD-10-CM

## 2015-03-24 DIAGNOSIS — I5032 Chronic diastolic (congestive) heart failure: Secondary | ICD-10-CM

## 2015-03-24 DIAGNOSIS — Z96652 Presence of left artificial knee joint: Secondary | ICD-10-CM

## 2015-03-24 NOTE — Progress Notes (Signed)
Patient ID: [name], male   DOB: 10-29-1946, 69 y.o.   MRN: [id number]    This encounter was created in error - please disregard.

## 2015-03-24 NOTE — Progress Notes (Signed)
Patient ID: [name], male   DOB: Nov 30, 1946, 69 y.o.   MRN: [id number]       This is a discharge note  Level care skilled.  Facility [facility].  Chief complaint --discharge note  History of present illness.  Patient is a 69 year old male who is here for short-term rehabilitation after undergoing a left knee replacement secondary to end-stage osteoarthritis which failed conservative therapy.  He apparently tolerated the procedure without complication and again is here for rehabilitation-he has numerous medical issues most prominently's history of edema- -with a history of grade 1 diastolic dysfunction per review of an echo done in November 2014 his ejection fraction is 50-55 percent He is followed  by cardiology  When he first came here he stated his weight was significant increased from baseline appears admission weight was 303 pounds-most recent weight is 284.5-his Lasix was increased at one point now he is on 40 mg a day.  This appears to have improved-he does have baseline chronic renal insufficiency with baseline creatinine around 1.8-this has slowly risen-and is up to 2.13 with a BUN of 49 on March 30-  In regards to his knee he is doing quite well and will be going home he will need PT and OT support as well as home health support he does live by himself.  He is currently ambulatory with a walke  Previous medical history.  History of left knee osteoarthritis status post replacement.  Previous history of right TKA.  Gout.  Cellulitis.  Blood loss anemia.  Obesity.  Diabetes type 2.  Arthrosclerotic cardiovascular disease.  Sleep apnea.  Chronic any disease.  Hypertension.  Diastolic CHF.  History of nonsustained ventricular tachycardia.  History of tobacco use 30-pack-years he quit in 1994.  The pH.  Erectile dysfunction.  Hyperlipidemia.  Anxiety.  Depression.  History of tubular adenoma.  Hemorrhoids.  Previous surgical  history.  Cardiac catheterization with stent placement.  Back surgery 2.  Hernia repair.  Total knee arthroplasty 2.  Left heart catheterization with coronary angiogram.  History of percutaneous coronary stent intervention.  Family medical social history.  Patient is an smoker with a 30-pack-year history he quit in January 1994-no history of smokeless tobacco or significant alcohol use.  Family history of hypertension and cancer negative history for colon cancer.  Medications  Tylenol 325 mg-take 1 or 2 tabs every 6 hours when necessary.  Allopurinol 300 mg every morning.  Xanax 0.5 mg twice a day when necessary anxiety.  Norvasc 10 mg every morning.  Colchicine 0.6 mg daily.  Colace 100 mg twice a day.  Ferrous sulfate 325 mg 3 times a day.  Lasix 60 mg every morning.  Neurontin 300 mg twice a day.  Lopressor 50 mg twice a day.  Nitrostat when necessary.  Prilosec 20 mg every morning.  OxyIR 5-15 milligrams every 4 hours when necessary severe pain.  MiraLAX 3 times a day.  Pravastatin 20 mg every morning.    .  Onglyza 2.5 mg every morning.  Review of systems.  General does not complain of any fever or chills he appears to have lost some weight I suspect this is fluid related and desired  Skin does not complain of rashes or itching.  Head ears eyes nose mouth and throat is not complaining of any sore throat visual changes.  Respiratory is not complaining of any shortness breath or cough.  Cardiac does not complain of chest pain has a history of diastolic CHF-has edema.  GI  does not complain of nausea vomiting diarrhea constipation or abdominal discomfort at this time.  GU is not complaining of dysuria.  Musculoskeletal at this point knee pain appears to be controlled he does have a history of gout as well does not complain of specific joint pain however.  Neurologic does not complain of dizziness headache or numbness currently.  Psych  he does have a history of anxiety and depression at this point appears to be baseline does not appear to be acutely anxious or depressed and does not complain of this.  Physical exam.   Temperature 97.8 pulse 72 respirations 20 blood pressure 146/62-129/69 --current weight is 284.5 appears admission weight was between 305 and 310  In general this is an elderly male in no distress----he is ambulating with his walker.  His skin is warm and dry --there is well-healed scarring of the surgical site left knee .  Head ears eyes nose mouth and throat-pupils appear reactive to light sclera and conjunctiva are clear daily appears intact.  Oropharynx clear mucous membranes moist.  His chest is clear to auscultation there is somewhat shallow air entry no labored breathing.  Heart is regular rate and rhythm without murmur gallop or rub.--He appears to have venous stasis changes 1-2 plus edema of his legs-with venous stasis changes this appears to be improvement from initial exam there is  greater edema on the left versus the right but this is baseline and not new status post surgery   Abdomen is obese soft nontender with positive bowel sounds.     Musculoskeletal  Appears to be doing well status post surgery is ambulating with a walker-surgical site as noted above I do not note any deformities  Neurologic is grossly intact I could not appreciate any lateralizing findings his speech is clear.  Psych he is alert and oriented pleasant and appropriate  Labs.  03/22/2015.  WBC 5.9 hemoglobin 8.1 platelets 373.  Sodium 136 potassium 4.8 BUN 49 creatinine 2.13.  03/15/2015.  BUN was 57 creatinine 2.07  03/15/2015.  WBC 6.2 hemoglobin 8.1 platelets 245.    03/10/2015.  Sodium 141 potassium 4.3 BUN 42 creatinine 1.53.  03/09/2015.  WBC 9.0 hemoglobin 8.9 platelets 625.  At that time his creatinine was 1.75 BUN 49.  On March 16 his creatinine was 1.9 BUN 47.  Assessment  plan.  #1-status post left knee replacement-this appears to be stable clinically-he is receiving OxyIR as needed for pain--he had been on Xarelto apparently has completed this he has been on aspirin low-dose chronically --at this point  is not currently on anticoagulation I think there is some concern about with his low hemoglobin possibly bleeding into the surgical site possibly-at this point have recommended he  take 81 mg enteric-coated which he has been on chronically at home-will discuss this with discharge nurse when she is available -- I see initial orthopedic recommendation was to start aspirin 325 mg when Xarelto is complete however   This is not currently listed on his medication list again there is confusion here this will have to be clarified  Clinically he appears to be doing quite well .  #2-history of edema with what appears to diastolic CHF-currently on Lasix 40 mg a day-edema and weight appears to be improved-however his creatinine is going up a bit-we will reduce his Lasix slightly to 40 mg Monday Wednesday Friday 20 mg all other days   -  He is on a beta blocker as well as a statin.   Marland Kitchen  Clinically he appears to be stable.  #3-history of anemia thought to be postop he is on iron -hemoglobin continues to be somewhat low--but appears to be stabilized-this will need recheck by home health on Monday, April 4 primary care provider notified of results.  #4-history of hypertension he is on Norvasc 10 mg a day r--Note he is also on Lopressor 50 mg twice a day.--Blood pressures appear fairly stable ranging from 129/69-146/62 most recently I do not see consistent systolic elevations above 140  #5 history of gout he had issues with this during his previous stay here at this point appears clinically stable he is on allopurinol as well as colchicine--this is been stable during his most recent stay.  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  #7-history of diabetes type 2-he continues on Onglyza --CBGs appear to be stable largely in the lower 100s in the morning-more mid 100s occasionally above 200 at at bedtime although readings above 200 are quite rare   #8--history of coronary artery disease-again he is on hypertension medication including Lopressor-Norvasc-he is also on a statin --he does take aspirin 325 mg a day he did this apparently been even before surgery-he does have Nitrostat as needed--this was stable during his stay here     Patient appears to be doing well again he will need updated lab work done on Monday, April 4 including CBC and metabolic panel to keep an eye on his hemoglobin and renal function.  Have reduced his Lasix slightly to 40 mg Monday Wednesday Friday 20 mg all other days-edema and weight appears to be improved.  In regards to anticoagulation he does have aspirin enteric-coated at home 103 mg-this will have to be clarified with discharge nurse when available--at this point would like to give him some f protection but would not really want to aggravate any possible anemia here  Again hemoglobin will need to be rechecked on Monday  [id number]-of note greater than 30 minutes spent on this discharge summary

## 2015-03-26 ENCOUNTER — Encounter: Payer: Self-pay | Admitting: Internal Medicine

## 2015-03-27 NOTE — Progress Notes (Addendum)
Patient ID: [name], male   DOB: 02/14/1946, 69 y.o.   MRN: [id number]                PROGRESS NOTE  DATE:  03/15/2015          FACILITY: [facility]        LEVEL OF CARE:   SNF   Acute Visit              CHIEF COMPLAINT:  Follow up renal insufficiency.     HISTORY OF PRESENT ILLNESS:  This is a patient who came to [place] after an elective left total knee replacement.    He is a diabetic, on oral agents.    It appears that he has some degree of chronic renal insufficiency.  In 2014, his BUN was 25 and creatinine 1.46.  Notable at the time, he had a hemoglobin of 7.1.  I note from my notes at that time (he came to [place] after a right total knee replacement) that he was being followed by Hematology and that the nature of his anemia was not clear.  This may have been anemia of chronic renal insufficiency.    In any case, today his BUN is 57 and creatinine 2.07.  On admission to hospital this time, his BUN was 33 and creatinine 1.8.  His preop hemoglobin was 12.1.  This has dropped down to 8.1.        PHYSICAL EXAMINATION:   GENERAL APPEARANCE:  The patient is not in any distress.   CHEST/RESPIRATORY:  Clear air entry bilaterally.    CARDIOVASCULAR:   CARDIAC:  If anything, he is somewhat volume-contracted.     EDEMA/VARICOSITIES:  Extremities:  He has TED stockings on.  He had a fair amount of edema of the left leg.  I suspect this was all blood loss.     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  I think he can do with dropping his Lasix from 60 to 40.  I will recheck this next week.    Chronic anemia.  Probably some of this was chronic disease, although his hemoglobin was up to 12.1 before his current surgery, now at 8.1.  I think most of this is blood loss.  He is on ferrous sulfate 325 three times a day.

## 2015-03-28 DIAGNOSIS — I5032 Chronic diastolic (congestive) heart failure: Secondary | ICD-10-CM | POA: Diagnosis not present

## 2015-03-28 DIAGNOSIS — N183 Chronic kidney disease, stage 3 (moderate): Secondary | ICD-10-CM | POA: Diagnosis not present

## 2015-03-28 DIAGNOSIS — Z4801 Encounter for change or removal of surgical wound dressing: Secondary | ICD-10-CM | POA: Diagnosis not present

## 2015-03-28 DIAGNOSIS — Z5181 Encounter for therapeutic drug level monitoring: Secondary | ICD-10-CM | POA: Diagnosis not present

## 2015-03-28 DIAGNOSIS — D649 Anemia, unspecified: Secondary | ICD-10-CM | POA: Diagnosis not present

## 2015-03-28 DIAGNOSIS — Z96652 Presence of left artificial knee joint: Secondary | ICD-10-CM | POA: Diagnosis not present

## 2015-03-28 DIAGNOSIS — Z471 Aftercare following joint replacement surgery: Secondary | ICD-10-CM | POA: Diagnosis not present

## 2015-03-28 DIAGNOSIS — E119 Type 2 diabetes mellitus without complications: Secondary | ICD-10-CM | POA: Diagnosis not present

## 2015-03-28 DIAGNOSIS — I252 Old myocardial infarction: Secondary | ICD-10-CM | POA: Diagnosis not present

## 2015-03-28 DIAGNOSIS — G4733 Obstructive sleep apnea (adult) (pediatric): Secondary | ICD-10-CM | POA: Diagnosis not present

## 2015-03-28 DIAGNOSIS — I129 Hypertensive chronic kidney disease with stage 1 through stage 4 chronic kidney disease, or unspecified chronic kidney disease: Secondary | ICD-10-CM | POA: Diagnosis not present

## 2015-03-29 ENCOUNTER — Other Ambulatory Visit: Payer: Self-pay

## 2015-03-29 DIAGNOSIS — I129 Hypertensive chronic kidney disease with stage 1 through stage 4 chronic kidney disease, or unspecified chronic kidney disease: Secondary | ICD-10-CM | POA: Diagnosis not present

## 2015-03-29 DIAGNOSIS — N183 Chronic kidney disease, stage 3 (moderate): Secondary | ICD-10-CM | POA: Diagnosis not present

## 2015-03-29 DIAGNOSIS — Z5181 Encounter for therapeutic drug level monitoring: Secondary | ICD-10-CM | POA: Diagnosis not present

## 2015-03-29 DIAGNOSIS — Z96652 Presence of left artificial knee joint: Secondary | ICD-10-CM | POA: Diagnosis not present

## 2015-03-29 DIAGNOSIS — I5032 Chronic diastolic (congestive) heart failure: Secondary | ICD-10-CM | POA: Diagnosis not present

## 2015-03-29 DIAGNOSIS — I252 Old myocardial infarction: Secondary | ICD-10-CM | POA: Diagnosis not present

## 2015-03-29 DIAGNOSIS — Z471 Aftercare following joint replacement surgery: Secondary | ICD-10-CM | POA: Diagnosis not present

## 2015-03-29 DIAGNOSIS — Z4801 Encounter for change or removal of surgical wound dressing: Secondary | ICD-10-CM | POA: Diagnosis not present

## 2015-03-29 DIAGNOSIS — E119 Type 2 diabetes mellitus without complications: Secondary | ICD-10-CM | POA: Diagnosis not present

## 2015-03-29 DIAGNOSIS — G4733 Obstructive sleep apnea (adult) (pediatric): Secondary | ICD-10-CM | POA: Diagnosis not present

## 2015-03-29 DIAGNOSIS — D649 Anemia, unspecified: Secondary | ICD-10-CM | POA: Diagnosis not present

## 2015-03-29 MED ORDER — OXYCODONE HCL 5 MG PO TABS: ORAL_TABLET | ORAL | Status: DC

## 2015-03-29 NOTE — Telephone Encounter (Signed)
RX Fax for [facility]@ [phone]  

## 2015-03-30 DIAGNOSIS — D649 Anemia, unspecified: Secondary | ICD-10-CM | POA: Diagnosis not present

## 2015-03-30 DIAGNOSIS — E119 Type 2 diabetes mellitus without complications: Secondary | ICD-10-CM | POA: Diagnosis not present

## 2015-03-30 DIAGNOSIS — N183 Chronic kidney disease, stage 3 (moderate): Secondary | ICD-10-CM | POA: Diagnosis not present

## 2015-03-30 DIAGNOSIS — Z96652 Presence of left artificial knee joint: Secondary | ICD-10-CM | POA: Diagnosis not present

## 2015-03-30 DIAGNOSIS — Z4801 Encounter for change or removal of surgical wound dressing: Secondary | ICD-10-CM | POA: Diagnosis not present

## 2015-03-30 DIAGNOSIS — G4733 Obstructive sleep apnea (adult) (pediatric): Secondary | ICD-10-CM | POA: Diagnosis not present

## 2015-03-30 DIAGNOSIS — I5032 Chronic diastolic (congestive) heart failure: Secondary | ICD-10-CM | POA: Diagnosis not present

## 2015-03-30 DIAGNOSIS — Z471 Aftercare following joint replacement surgery: Secondary | ICD-10-CM | POA: Diagnosis not present

## 2015-03-30 DIAGNOSIS — Z5181 Encounter for therapeutic drug level monitoring: Secondary | ICD-10-CM | POA: Diagnosis not present

## 2015-03-30 DIAGNOSIS — I129 Hypertensive chronic kidney disease with stage 1 through stage 4 chronic kidney disease, or unspecified chronic kidney disease: Secondary | ICD-10-CM | POA: Diagnosis not present

## 2015-03-30 DIAGNOSIS — I252 Old myocardial infarction: Secondary | ICD-10-CM | POA: Diagnosis not present

## 2015-03-31 DIAGNOSIS — I129 Hypertensive chronic kidney disease with stage 1 through stage 4 chronic kidney disease, or unspecified chronic kidney disease: Secondary | ICD-10-CM | POA: Diagnosis not present

## 2015-03-31 DIAGNOSIS — G4733 Obstructive sleep apnea (adult) (pediatric): Secondary | ICD-10-CM | POA: Diagnosis not present

## 2015-03-31 DIAGNOSIS — Z4801 Encounter for change or removal of surgical wound dressing: Secondary | ICD-10-CM | POA: Diagnosis not present

## 2015-03-31 DIAGNOSIS — I252 Old myocardial infarction: Secondary | ICD-10-CM | POA: Diagnosis not present

## 2015-03-31 DIAGNOSIS — Z471 Aftercare following joint replacement surgery: Secondary | ICD-10-CM | POA: Diagnosis not present

## 2015-03-31 DIAGNOSIS — Z96652 Presence of left artificial knee joint: Secondary | ICD-10-CM | POA: Diagnosis not present

## 2015-03-31 DIAGNOSIS — N183 Chronic kidney disease, stage 3 (moderate): Secondary | ICD-10-CM | POA: Diagnosis not present

## 2015-03-31 DIAGNOSIS — D649 Anemia, unspecified: Secondary | ICD-10-CM | POA: Diagnosis not present

## 2015-03-31 DIAGNOSIS — E119 Type 2 diabetes mellitus without complications: Secondary | ICD-10-CM | POA: Diagnosis not present

## 2015-03-31 DIAGNOSIS — Z5181 Encounter for therapeutic drug level monitoring: Secondary | ICD-10-CM | POA: Diagnosis not present

## 2015-03-31 DIAGNOSIS — I5032 Chronic diastolic (congestive) heart failure: Secondary | ICD-10-CM | POA: Diagnosis not present

## 2015-04-03 DIAGNOSIS — N183 Chronic kidney disease, stage 3 (moderate): Secondary | ICD-10-CM | POA: Diagnosis not present

## 2015-04-03 DIAGNOSIS — I129 Hypertensive chronic kidney disease with stage 1 through stage 4 chronic kidney disease, or unspecified chronic kidney disease: Secondary | ICD-10-CM | POA: Diagnosis not present

## 2015-04-03 DIAGNOSIS — I252 Old myocardial infarction: Secondary | ICD-10-CM | POA: Diagnosis not present

## 2015-04-03 DIAGNOSIS — I5032 Chronic diastolic (congestive) heart failure: Secondary | ICD-10-CM | POA: Diagnosis not present

## 2015-04-03 DIAGNOSIS — Z96652 Presence of left artificial knee joint: Secondary | ICD-10-CM | POA: Diagnosis not present

## 2015-04-03 DIAGNOSIS — E119 Type 2 diabetes mellitus without complications: Secondary | ICD-10-CM | POA: Diagnosis not present

## 2015-04-03 DIAGNOSIS — G4733 Obstructive sleep apnea (adult) (pediatric): Secondary | ICD-10-CM | POA: Diagnosis not present

## 2015-04-03 DIAGNOSIS — Z471 Aftercare following joint replacement surgery: Secondary | ICD-10-CM | POA: Diagnosis not present

## 2015-04-03 DIAGNOSIS — D649 Anemia, unspecified: Secondary | ICD-10-CM | POA: Diagnosis not present

## 2015-04-03 DIAGNOSIS — Z5181 Encounter for therapeutic drug level monitoring: Secondary | ICD-10-CM | POA: Diagnosis not present

## 2015-04-03 DIAGNOSIS — Z4801 Encounter for change or removal of surgical wound dressing: Secondary | ICD-10-CM | POA: Diagnosis not present

## 2015-04-05 DIAGNOSIS — I252 Old myocardial infarction: Secondary | ICD-10-CM | POA: Diagnosis not present

## 2015-04-05 DIAGNOSIS — Z471 Aftercare following joint replacement surgery: Secondary | ICD-10-CM | POA: Diagnosis not present

## 2015-04-05 DIAGNOSIS — E119 Type 2 diabetes mellitus without complications: Secondary | ICD-10-CM | POA: Diagnosis not present

## 2015-04-05 DIAGNOSIS — Z5181 Encounter for therapeutic drug level monitoring: Secondary | ICD-10-CM | POA: Diagnosis not present

## 2015-04-05 DIAGNOSIS — Z96652 Presence of left artificial knee joint: Secondary | ICD-10-CM | POA: Diagnosis not present

## 2015-04-05 DIAGNOSIS — N183 Chronic kidney disease, stage 3 (moderate): Secondary | ICD-10-CM | POA: Diagnosis not present

## 2015-04-05 DIAGNOSIS — Z4801 Encounter for change or removal of surgical wound dressing: Secondary | ICD-10-CM | POA: Diagnosis not present

## 2015-04-05 DIAGNOSIS — D649 Anemia, unspecified: Secondary | ICD-10-CM | POA: Diagnosis not present

## 2015-04-05 DIAGNOSIS — I129 Hypertensive chronic kidney disease with stage 1 through stage 4 chronic kidney disease, or unspecified chronic kidney disease: Secondary | ICD-10-CM | POA: Diagnosis not present

## 2015-04-05 DIAGNOSIS — I5032 Chronic diastolic (congestive) heart failure: Secondary | ICD-10-CM | POA: Diagnosis not present

## 2015-04-05 DIAGNOSIS — G4733 Obstructive sleep apnea (adult) (pediatric): Secondary | ICD-10-CM | POA: Diagnosis not present

## 2015-04-06 ENCOUNTER — Ambulatory Visit (HOSPITAL_COMMUNITY): Payer: Medicare Other | Attending: Internal Medicine | Admitting: Physical Therapy

## 2015-04-06 DIAGNOSIS — E6609 Other obesity due to excess calories: Secondary | ICD-10-CM | POA: Diagnosis not present

## 2015-04-06 DIAGNOSIS — R600 Localized edema: Secondary | ICD-10-CM | POA: Diagnosis not present

## 2015-04-06 DIAGNOSIS — Z6833 Body mass index (BMI) 33.0-33.9, adult: Secondary | ICD-10-CM | POA: Diagnosis not present

## 2015-04-07 DIAGNOSIS — I129 Hypertensive chronic kidney disease with stage 1 through stage 4 chronic kidney disease, or unspecified chronic kidney disease: Secondary | ICD-10-CM | POA: Diagnosis not present

## 2015-04-07 DIAGNOSIS — G4733 Obstructive sleep apnea (adult) (pediatric): Secondary | ICD-10-CM | POA: Diagnosis not present

## 2015-04-07 DIAGNOSIS — E119 Type 2 diabetes mellitus without complications: Secondary | ICD-10-CM | POA: Diagnosis not present

## 2015-04-07 DIAGNOSIS — Z5181 Encounter for therapeutic drug level monitoring: Secondary | ICD-10-CM | POA: Diagnosis not present

## 2015-04-07 DIAGNOSIS — Z4801 Encounter for change or removal of surgical wound dressing: Secondary | ICD-10-CM | POA: Diagnosis not present

## 2015-04-07 DIAGNOSIS — I252 Old myocardial infarction: Secondary | ICD-10-CM | POA: Diagnosis not present

## 2015-04-07 DIAGNOSIS — N183 Chronic kidney disease, stage 3 (moderate): Secondary | ICD-10-CM | POA: Diagnosis not present

## 2015-04-07 DIAGNOSIS — Z96652 Presence of left artificial knee joint: Secondary | ICD-10-CM | POA: Diagnosis not present

## 2015-04-07 DIAGNOSIS — I5032 Chronic diastolic (congestive) heart failure: Secondary | ICD-10-CM | POA: Diagnosis not present

## 2015-04-07 DIAGNOSIS — Z471 Aftercare following joint replacement surgery: Secondary | ICD-10-CM | POA: Diagnosis not present

## 2015-04-07 DIAGNOSIS — D649 Anemia, unspecified: Secondary | ICD-10-CM | POA: Diagnosis not present

## 2015-04-10 ENCOUNTER — Other Ambulatory Visit (HOSPITAL_COMMUNITY): Payer: Self-pay | Admitting: Physician Assistant

## 2015-04-10 ENCOUNTER — Ambulatory Visit (HOSPITAL_COMMUNITY)
Admission: RE | Admit: 2015-04-10 | Discharge: 2015-04-10 | Disposition: A | Discharge status: Home / Self Care | Payer: Medicare Other | Source: Ambulatory Visit | Attending: Physician Assistant | Admitting: Physician Assistant

## 2015-04-10 DIAGNOSIS — Z471 Aftercare following joint replacement surgery: Secondary | ICD-10-CM | POA: Diagnosis not present

## 2015-04-10 DIAGNOSIS — E119 Type 2 diabetes mellitus without complications: Secondary | ICD-10-CM | POA: Diagnosis not present

## 2015-04-10 DIAGNOSIS — D649 Anemia, unspecified: Secondary | ICD-10-CM | POA: Diagnosis not present

## 2015-04-10 DIAGNOSIS — I252 Old myocardial infarction: Secondary | ICD-10-CM | POA: Diagnosis not present

## 2015-04-10 DIAGNOSIS — R600 Localized edema: Secondary | ICD-10-CM

## 2015-04-10 DIAGNOSIS — Z4801 Encounter for change or removal of surgical wound dressing: Secondary | ICD-10-CM | POA: Diagnosis not present

## 2015-04-10 DIAGNOSIS — M1991 Primary osteoarthritis, unspecified site: Secondary | ICD-10-CM | POA: Diagnosis not present

## 2015-04-10 DIAGNOSIS — E6609 Other obesity due to excess calories: Secondary | ICD-10-CM | POA: Diagnosis not present

## 2015-04-10 DIAGNOSIS — N183 Chronic kidney disease, stage 3 (moderate): Secondary | ICD-10-CM | POA: Diagnosis not present

## 2015-04-10 DIAGNOSIS — Z96652 Presence of left artificial knee joint: Secondary | ICD-10-CM | POA: Diagnosis not present

## 2015-04-10 DIAGNOSIS — Z5181 Encounter for therapeutic drug level monitoring: Secondary | ICD-10-CM | POA: Diagnosis not present

## 2015-04-10 DIAGNOSIS — I129 Hypertensive chronic kidney disease with stage 1 through stage 4 chronic kidney disease, or unspecified chronic kidney disease: Secondary | ICD-10-CM | POA: Diagnosis not present

## 2015-04-10 DIAGNOSIS — G4733 Obstructive sleep apnea (adult) (pediatric): Secondary | ICD-10-CM | POA: Diagnosis not present

## 2015-04-10 DIAGNOSIS — Z6833 Body mass index (BMI) 33.0-33.9, adult: Secondary | ICD-10-CM | POA: Diagnosis not present

## 2015-04-10 DIAGNOSIS — I5032 Chronic diastolic (congestive) heart failure: Secondary | ICD-10-CM | POA: Diagnosis not present

## 2015-04-11 DIAGNOSIS — D649 Anemia, unspecified: Secondary | ICD-10-CM | POA: Diagnosis not present

## 2015-04-11 DIAGNOSIS — Z4801 Encounter for change or removal of surgical wound dressing: Secondary | ICD-10-CM | POA: Diagnosis not present

## 2015-04-11 DIAGNOSIS — I252 Old myocardial infarction: Secondary | ICD-10-CM | POA: Diagnosis not present

## 2015-04-11 DIAGNOSIS — I129 Hypertensive chronic kidney disease with stage 1 through stage 4 chronic kidney disease, or unspecified chronic kidney disease: Secondary | ICD-10-CM | POA: Diagnosis not present

## 2015-04-11 DIAGNOSIS — Z471 Aftercare following joint replacement surgery: Secondary | ICD-10-CM | POA: Diagnosis not present

## 2015-04-11 DIAGNOSIS — E119 Type 2 diabetes mellitus without complications: Secondary | ICD-10-CM | POA: Diagnosis not present

## 2015-04-11 DIAGNOSIS — Z96652 Presence of left artificial knee joint: Secondary | ICD-10-CM | POA: Diagnosis not present

## 2015-04-11 DIAGNOSIS — G4733 Obstructive sleep apnea (adult) (pediatric): Secondary | ICD-10-CM | POA: Diagnosis not present

## 2015-04-11 DIAGNOSIS — Z5181 Encounter for therapeutic drug level monitoring: Secondary | ICD-10-CM | POA: Diagnosis not present

## 2015-04-11 DIAGNOSIS — N183 Chronic kidney disease, stage 3 (moderate): Secondary | ICD-10-CM | POA: Diagnosis not present

## 2015-04-11 DIAGNOSIS — I5032 Chronic diastolic (congestive) heart failure: Secondary | ICD-10-CM | POA: Diagnosis not present

## 2015-04-12 DIAGNOSIS — Z5181 Encounter for therapeutic drug level monitoring: Secondary | ICD-10-CM | POA: Diagnosis not present

## 2015-04-12 DIAGNOSIS — Z96652 Presence of left artificial knee joint: Secondary | ICD-10-CM | POA: Diagnosis not present

## 2015-04-12 DIAGNOSIS — I5032 Chronic diastolic (congestive) heart failure: Secondary | ICD-10-CM | POA: Diagnosis not present

## 2015-04-12 DIAGNOSIS — D649 Anemia, unspecified: Secondary | ICD-10-CM | POA: Diagnosis not present

## 2015-04-12 DIAGNOSIS — E119 Type 2 diabetes mellitus without complications: Secondary | ICD-10-CM | POA: Diagnosis not present

## 2015-04-12 DIAGNOSIS — Z471 Aftercare following joint replacement surgery: Secondary | ICD-10-CM | POA: Diagnosis not present

## 2015-04-12 DIAGNOSIS — N183 Chronic kidney disease, stage 3 (moderate): Secondary | ICD-10-CM | POA: Diagnosis not present

## 2015-04-12 DIAGNOSIS — I129 Hypertensive chronic kidney disease with stage 1 through stage 4 chronic kidney disease, or unspecified chronic kidney disease: Secondary | ICD-10-CM | POA: Diagnosis not present

## 2015-04-12 DIAGNOSIS — Z4801 Encounter for change or removal of surgical wound dressing: Secondary | ICD-10-CM | POA: Diagnosis not present

## 2015-04-12 DIAGNOSIS — G4733 Obstructive sleep apnea (adult) (pediatric): Secondary | ICD-10-CM | POA: Diagnosis not present

## 2015-04-12 DIAGNOSIS — I252 Old myocardial infarction: Secondary | ICD-10-CM | POA: Diagnosis not present

## 2015-04-14 DIAGNOSIS — G4733 Obstructive sleep apnea (adult) (pediatric): Secondary | ICD-10-CM | POA: Diagnosis not present

## 2015-04-14 DIAGNOSIS — I252 Old myocardial infarction: Secondary | ICD-10-CM | POA: Diagnosis not present

## 2015-04-14 DIAGNOSIS — I129 Hypertensive chronic kidney disease with stage 1 through stage 4 chronic kidney disease, or unspecified chronic kidney disease: Secondary | ICD-10-CM | POA: Diagnosis not present

## 2015-04-14 DIAGNOSIS — N183 Chronic kidney disease, stage 3 (moderate): Secondary | ICD-10-CM | POA: Diagnosis not present

## 2015-04-14 DIAGNOSIS — D649 Anemia, unspecified: Secondary | ICD-10-CM | POA: Diagnosis not present

## 2015-04-14 DIAGNOSIS — Z471 Aftercare following joint replacement surgery: Secondary | ICD-10-CM | POA: Diagnosis not present

## 2015-04-14 DIAGNOSIS — Z96652 Presence of left artificial knee joint: Secondary | ICD-10-CM | POA: Diagnosis not present

## 2015-04-14 DIAGNOSIS — Z4801 Encounter for change or removal of surgical wound dressing: Secondary | ICD-10-CM | POA: Diagnosis not present

## 2015-04-14 DIAGNOSIS — Z5181 Encounter for therapeutic drug level monitoring: Secondary | ICD-10-CM | POA: Diagnosis not present

## 2015-04-14 DIAGNOSIS — E119 Type 2 diabetes mellitus without complications: Secondary | ICD-10-CM | POA: Diagnosis not present

## 2015-04-14 DIAGNOSIS — I5032 Chronic diastolic (congestive) heart failure: Secondary | ICD-10-CM | POA: Diagnosis not present

## 2015-04-17 DIAGNOSIS — I5032 Chronic diastolic (congestive) heart failure: Secondary | ICD-10-CM | POA: Diagnosis not present

## 2015-04-17 DIAGNOSIS — Z96652 Presence of left artificial knee joint: Secondary | ICD-10-CM | POA: Diagnosis not present

## 2015-04-17 DIAGNOSIS — N183 Chronic kidney disease, stage 3 (moderate): Secondary | ICD-10-CM | POA: Diagnosis not present

## 2015-04-17 DIAGNOSIS — G4733 Obstructive sleep apnea (adult) (pediatric): Secondary | ICD-10-CM | POA: Diagnosis not present

## 2015-04-17 DIAGNOSIS — Z4801 Encounter for change or removal of surgical wound dressing: Secondary | ICD-10-CM | POA: Diagnosis not present

## 2015-04-17 DIAGNOSIS — I129 Hypertensive chronic kidney disease with stage 1 through stage 4 chronic kidney disease, or unspecified chronic kidney disease: Secondary | ICD-10-CM | POA: Diagnosis not present

## 2015-04-17 DIAGNOSIS — D649 Anemia, unspecified: Secondary | ICD-10-CM | POA: Diagnosis not present

## 2015-04-17 DIAGNOSIS — Z471 Aftercare following joint replacement surgery: Secondary | ICD-10-CM | POA: Diagnosis not present

## 2015-04-17 DIAGNOSIS — E119 Type 2 diabetes mellitus without complications: Secondary | ICD-10-CM | POA: Diagnosis not present

## 2015-04-17 DIAGNOSIS — I252 Old myocardial infarction: Secondary | ICD-10-CM | POA: Diagnosis not present

## 2015-04-17 DIAGNOSIS — Z5181 Encounter for therapeutic drug level monitoring: Secondary | ICD-10-CM | POA: Diagnosis not present

## 2015-04-18 DIAGNOSIS — M1991 Primary osteoarthritis, unspecified site: Secondary | ICD-10-CM | POA: Diagnosis not present

## 2015-04-18 DIAGNOSIS — Z4801 Encounter for change or removal of surgical wound dressing: Secondary | ICD-10-CM | POA: Diagnosis not present

## 2015-04-18 DIAGNOSIS — I252 Old myocardial infarction: Secondary | ICD-10-CM | POA: Diagnosis not present

## 2015-04-18 DIAGNOSIS — E119 Type 2 diabetes mellitus without complications: Secondary | ICD-10-CM | POA: Diagnosis not present

## 2015-04-18 DIAGNOSIS — I5032 Chronic diastolic (congestive) heart failure: Secondary | ICD-10-CM | POA: Diagnosis not present

## 2015-04-18 DIAGNOSIS — Z471 Aftercare following joint replacement surgery: Secondary | ICD-10-CM | POA: Diagnosis not present

## 2015-04-18 DIAGNOSIS — I129 Hypertensive chronic kidney disease with stage 1 through stage 4 chronic kidney disease, or unspecified chronic kidney disease: Secondary | ICD-10-CM | POA: Diagnosis not present

## 2015-04-18 DIAGNOSIS — Z6833 Body mass index (BMI) 33.0-33.9, adult: Secondary | ICD-10-CM | POA: Diagnosis not present

## 2015-04-18 DIAGNOSIS — Z96652 Presence of left artificial knee joint: Secondary | ICD-10-CM | POA: Diagnosis not present

## 2015-04-18 DIAGNOSIS — D649 Anemia, unspecified: Secondary | ICD-10-CM | POA: Diagnosis not present

## 2015-04-18 DIAGNOSIS — G4733 Obstructive sleep apnea (adult) (pediatric): Secondary | ICD-10-CM | POA: Diagnosis not present

## 2015-04-18 DIAGNOSIS — E6609 Other obesity due to excess calories: Secondary | ICD-10-CM | POA: Diagnosis not present

## 2015-04-18 DIAGNOSIS — N183 Chronic kidney disease, stage 3 (moderate): Secondary | ICD-10-CM | POA: Diagnosis not present

## 2015-04-18 DIAGNOSIS — Z5181 Encounter for therapeutic drug level monitoring: Secondary | ICD-10-CM | POA: Diagnosis not present

## 2015-04-18 DIAGNOSIS — G894 Chronic pain syndrome: Secondary | ICD-10-CM | POA: Diagnosis not present

## 2015-04-19 DIAGNOSIS — G4733 Obstructive sleep apnea (adult) (pediatric): Secondary | ICD-10-CM | POA: Diagnosis not present

## 2015-04-19 DIAGNOSIS — N183 Chronic kidney disease, stage 3 (moderate): Secondary | ICD-10-CM | POA: Diagnosis not present

## 2015-04-19 DIAGNOSIS — I5032 Chronic diastolic (congestive) heart failure: Secondary | ICD-10-CM | POA: Diagnosis not present

## 2015-04-19 DIAGNOSIS — I129 Hypertensive chronic kidney disease with stage 1 through stage 4 chronic kidney disease, or unspecified chronic kidney disease: Secondary | ICD-10-CM | POA: Diagnosis not present

## 2015-04-19 DIAGNOSIS — E119 Type 2 diabetes mellitus without complications: Secondary | ICD-10-CM | POA: Diagnosis not present

## 2015-04-19 DIAGNOSIS — I252 Old myocardial infarction: Secondary | ICD-10-CM | POA: Diagnosis not present

## 2015-04-19 DIAGNOSIS — Z471 Aftercare following joint replacement surgery: Secondary | ICD-10-CM | POA: Diagnosis not present

## 2015-04-19 DIAGNOSIS — Z5181 Encounter for therapeutic drug level monitoring: Secondary | ICD-10-CM | POA: Diagnosis not present

## 2015-04-19 DIAGNOSIS — D649 Anemia, unspecified: Secondary | ICD-10-CM | POA: Diagnosis not present

## 2015-04-19 DIAGNOSIS — Z96652 Presence of left artificial knee joint: Secondary | ICD-10-CM | POA: Diagnosis not present

## 2015-04-19 DIAGNOSIS — Z4801 Encounter for change or removal of surgical wound dressing: Secondary | ICD-10-CM | POA: Diagnosis not present

## 2015-04-21 DIAGNOSIS — I252 Old myocardial infarction: Secondary | ICD-10-CM | POA: Diagnosis not present

## 2015-04-21 DIAGNOSIS — D649 Anemia, unspecified: Secondary | ICD-10-CM | POA: Diagnosis not present

## 2015-04-21 DIAGNOSIS — I129 Hypertensive chronic kidney disease with stage 1 through stage 4 chronic kidney disease, or unspecified chronic kidney disease: Secondary | ICD-10-CM | POA: Diagnosis not present

## 2015-04-21 DIAGNOSIS — Z4801 Encounter for change or removal of surgical wound dressing: Secondary | ICD-10-CM | POA: Diagnosis not present

## 2015-04-21 DIAGNOSIS — Z5181 Encounter for therapeutic drug level monitoring: Secondary | ICD-10-CM | POA: Diagnosis not present

## 2015-04-21 DIAGNOSIS — E119 Type 2 diabetes mellitus without complications: Secondary | ICD-10-CM | POA: Diagnosis not present

## 2015-04-21 DIAGNOSIS — Z471 Aftercare following joint replacement surgery: Secondary | ICD-10-CM | POA: Diagnosis not present

## 2015-04-21 DIAGNOSIS — I5032 Chronic diastolic (congestive) heart failure: Secondary | ICD-10-CM | POA: Diagnosis not present

## 2015-04-21 DIAGNOSIS — N183 Chronic kidney disease, stage 3 (moderate): Secondary | ICD-10-CM | POA: Diagnosis not present

## 2015-04-21 DIAGNOSIS — Z96652 Presence of left artificial knee joint: Secondary | ICD-10-CM | POA: Diagnosis not present

## 2015-04-21 DIAGNOSIS — G4733 Obstructive sleep apnea (adult) (pediatric): Secondary | ICD-10-CM | POA: Diagnosis not present

## 2015-04-25 DIAGNOSIS — I252 Old myocardial infarction: Secondary | ICD-10-CM | POA: Diagnosis not present

## 2015-04-25 DIAGNOSIS — Z5181 Encounter for therapeutic drug level monitoring: Secondary | ICD-10-CM | POA: Diagnosis not present

## 2015-04-25 DIAGNOSIS — G4733 Obstructive sleep apnea (adult) (pediatric): Secondary | ICD-10-CM | POA: Diagnosis not present

## 2015-04-25 DIAGNOSIS — I5032 Chronic diastolic (congestive) heart failure: Secondary | ICD-10-CM | POA: Diagnosis not present

## 2015-04-25 DIAGNOSIS — N183 Chronic kidney disease, stage 3 (moderate): Secondary | ICD-10-CM | POA: Diagnosis not present

## 2015-04-25 DIAGNOSIS — Z471 Aftercare following joint replacement surgery: Secondary | ICD-10-CM | POA: Diagnosis not present

## 2015-04-25 DIAGNOSIS — I129 Hypertensive chronic kidney disease with stage 1 through stage 4 chronic kidney disease, or unspecified chronic kidney disease: Secondary | ICD-10-CM | POA: Diagnosis not present

## 2015-04-25 DIAGNOSIS — Z4801 Encounter for change or removal of surgical wound dressing: Secondary | ICD-10-CM | POA: Diagnosis not present

## 2015-04-25 DIAGNOSIS — D649 Anemia, unspecified: Secondary | ICD-10-CM | POA: Diagnosis not present

## 2015-04-25 DIAGNOSIS — Z96652 Presence of left artificial knee joint: Secondary | ICD-10-CM | POA: Diagnosis not present

## 2015-04-25 DIAGNOSIS — E119 Type 2 diabetes mellitus without complications: Secondary | ICD-10-CM | POA: Diagnosis not present

## 2015-04-26 DIAGNOSIS — Z471 Aftercare following joint replacement surgery: Secondary | ICD-10-CM | POA: Diagnosis not present

## 2015-04-26 DIAGNOSIS — Z96652 Presence of left artificial knee joint: Secondary | ICD-10-CM | POA: Diagnosis not present

## 2015-04-27 DIAGNOSIS — Z96652 Presence of left artificial knee joint: Secondary | ICD-10-CM | POA: Diagnosis not present

## 2015-04-27 DIAGNOSIS — N183 Chronic kidney disease, stage 3 (moderate): Secondary | ICD-10-CM | POA: Diagnosis not present

## 2015-04-27 DIAGNOSIS — I5032 Chronic diastolic (congestive) heart failure: Secondary | ICD-10-CM | POA: Diagnosis not present

## 2015-04-27 DIAGNOSIS — D649 Anemia, unspecified: Secondary | ICD-10-CM | POA: Diagnosis not present

## 2015-04-27 DIAGNOSIS — Z471 Aftercare following joint replacement surgery: Secondary | ICD-10-CM | POA: Diagnosis not present

## 2015-04-27 DIAGNOSIS — I129 Hypertensive chronic kidney disease with stage 1 through stage 4 chronic kidney disease, or unspecified chronic kidney disease: Secondary | ICD-10-CM | POA: Diagnosis not present

## 2015-04-27 DIAGNOSIS — E119 Type 2 diabetes mellitus without complications: Secondary | ICD-10-CM | POA: Diagnosis not present

## 2015-04-27 DIAGNOSIS — Z5181 Encounter for therapeutic drug level monitoring: Secondary | ICD-10-CM | POA: Diagnosis not present

## 2015-04-27 DIAGNOSIS — Z4801 Encounter for change or removal of surgical wound dressing: Secondary | ICD-10-CM | POA: Diagnosis not present

## 2015-04-27 DIAGNOSIS — I252 Old myocardial infarction: Secondary | ICD-10-CM | POA: Diagnosis not present

## 2015-04-27 DIAGNOSIS — G4733 Obstructive sleep apnea (adult) (pediatric): Secondary | ICD-10-CM | POA: Diagnosis not present

## 2015-05-03 ENCOUNTER — Ambulatory Visit (HOSPITAL_COMMUNITY): Payer: Medicare Other | Attending: Internal Medicine | Admitting: Physical Therapy

## 2015-05-03 DIAGNOSIS — R609 Edema, unspecified: Secondary | ICD-10-CM | POA: Insufficient documentation

## 2015-05-03 DIAGNOSIS — M6281 Muscle weakness (generalized): Secondary | ICD-10-CM | POA: Insufficient documentation

## 2015-05-03 DIAGNOSIS — M25662 Stiffness of left knee, not elsewhere classified: Secondary | ICD-10-CM | POA: Diagnosis not present

## 2015-05-03 NOTE — Therapy (Signed)
[facility] [place], [place], [address] Phone: [phone]   Fax:  [phone]  Physical Therapy Evaluation  Patient Details  Name: [name] MRN: [id number] Date of Birth: 01-21-1946 Referring Provider:  Ricard Dillon, MD  Encounter Date: 05/03/2015      PT End of Session - 05/03/15 1645    Visit Number 1   Number of Visits 12   Date for PT Re-Evaluation 06/02/15   Authorization Type [employer] medicare   Authorization - Visit Number 1   Authorization - Number of Visits 10   PT Start Time 7017   PT Stop Time 1528   PT Time Calculation (min) 43 min   Activity Tolerance Patient tolerated treatment well      Past Medical History  Diagnosis Date  . Diastolic heart failure     Pedal edema, "a long time ago"  . Hypertension     Exercise induced  . NSVT (nonsustained ventricular tachycardia)     Exercise induced  . 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 Obesity   . Degenerative joint disease   . Benign prostatic hypertrophy     Nocturia  . Erectile dysfunction   . High cholesterol   . CAD (coronary artery disease)     a.  NSTEMI 10/2012 s/p DES to LAD & DES to 1st diagonal with residual diffuse nonobstructive dz in LCx/RCA   . Chronic kidney disease, stage 3, mod decreased GFR     Creatinine of 1.5 in 03/2009  . Diabetes mellitus, type II 12/10/2012.  Marland Kitchen Anxiety   . Depression     hx of  . Flu 2013    hx of  . Hemorrhoids   . Tubular adenoma 2014  . GERD (gastroesophageal reflux disease)     Past Surgical History  Procedure Laterality Date  . Cardiac catheterization      with stent placement  . Back surgery  2007    x2  . Hernia repair    . Total knee arthroplasty Right 11/15/2013    Procedure: RIGHT TOTAL KNEE ARTHROPLASTY;  Surgeon: Mauri Pole, MD;  Location: [facility];  Service: Orthopedics;  Laterality: Right;  . Colonoscopy with esophagogastroduodenoscopy (egd) N/A  11/23/2013    Dr. Oneida Alar- TCS= left sided colitis and mild proctitis, moderate internal hemorrhoids, small nodule in the rectum, bx=tubular adenoma, EGD=-mild non-erosive gastritis  . Left heart catheterization with coronary angiogram N/A 11/16/2012    Procedure: LEFT HEART CATHETERIZATION WITH CORONARY ANGIOGRAM;  Surgeon: Sherren Mocha, MD;  Location: [facility] CATH LAB;  Service: Cardiovascular;  Laterality: N/A;  . Percutaneous coronary stent intervention (pci-s)  11/16/2012    Procedure: PERCUTANEOUS CORONARY STENT INTERVENTION (PCI-S);  Surgeon: Sherren Mocha, MD;  Location: [facility] CATH LAB;  Service: Cardiovascular;;  . Total knee arthroplasty Left 03/07/2015    Procedure: LEFT TOTAL KNEE ARTHROPLASTY;  Surgeon: Paralee Cancel, MD;  Location: [facility];  Service: Orthopedics;  Laterality: Left;    There were no vitals filed for this visit.  Visit Diagnosis:  Muscle weakness (generalized)  Knee stiffness, left  Edema      Subjective Assessment - 05/03/15 1448    Subjective [name] has undergone a Lt TKR on 03/07/2015.  He was discharged to SNF on 03/10/2015 and went home on 03/20/2015.  He then recieved [facility] therapy until last week.  At this time [name] states that he is going to [employer].  He states his main problem is the swelling in his knee.  He would like to get the swellling and stiffness out of his knee as well as get off his cane.    How long can you sit comfortably? no problem    How long can you stand comfortably? able to stand for 5 minutes of less    How long can you walk comfortably? Walking with a cane but only short distances.  If pt needs to walk very far he walks with a rollator.    Patient Stated Goals Get swelling out of his knee and walk without the cane.    Currently in Pain? No/denies   Pain Score 0-No pain       Date 05/03/2015 05/03/2015   Left Right  MTP 39.30 39.00  ankle 36.3 35  4cm 33.30 32.00  8cm 34.00 34.00  12 cm 36.80 36.20  16cm 40.00 39.40  20cm 42.00  41.00  24cm 44.70 43.60  28cm 46.00 44.50  32cm 44.00 42.70  36cm 49.00 46.50  40cm 51.00 49.00  44cm 52.00 49.80  48cm 60.80 54.50  52cm 64.00 57.00  56cm 65.50 58.00  60cm    64cm        Sum of squares 71696.78 31800.84  Total Volume 11358.67 10122.525          [facility] PT Assessment - 05/03/15 1457    Assessment   Medical Diagnosis Lt TKR   Onset Date 03/07/15   Next MD Visit 07/02/2015   Prior Therapy SNF, HH   Precautions   Precautions None   Restrictions   Weight Bearing Restrictions No   Balance Screen   Has the patient fallen in the past 6 months No   Has the patient had a decrease in activity level because of a fear of falling?  No   Is the patient reluctant to leave their home because of a fear of falling?  No   Prior Function   [facility] [employer]   Cognition   Overall Cognitive Status Within Functional Limits for tasks assessed   Observation/Other Assessments   Focus on Therapeutic Outcomes (FOTO)  68   Functional Tests   Functional tests Single leg stance   Single Leg Stance   Comments unable on either LE    AROM   Left Knee Extension 8   Left Knee Flexion 115   Strength   Right Hip Flexion 5/5   Right Hip Extension 3-/5   Right Hip ABduction 4/5   Right Knee Flexion 4/5   Right Knee Extension 5/5   Special Tests    Special Tests --  volume Lt 11358 cc; Rt 10122 cc                    [facility] Adult PT Treatment/Exercise - 05/03/15 0001    Manual Therapy   Manual Therapy Manual Lymphatic Drainage (MLD)   Manual Lymphatic Drainage (MLD) including supraclavicular, deep and superficial abdominal, routing fluid using  Lt inguinal-axillary anastomosis                   PT Short Term Goals - 05/03/15 1651    PT SHORT TERM GOAL #1   Title I HEP    Time 2   Period Weeks   PT SHORT TERM GOAL #2   Title Pt to be able to  SLS x 5 seconds for reduce risk of falling    Time 2   Period Weeks   PT  SHORT TERM GOAL #3   Title Pt fluid  volume to be decreased by 30%   Time 2   Period Weeks   PT SHORT TERM GOAL #4   Title ROM to be increased to 120 to allow pt to squat to the floor to pick up items   Time 2   Period Weeks           PT Long Term Goals - May 19, 2015 1652    PT LONG TERM GOAL #1   Title Pt SLS to be 10 seconds for reduced risk of falling    Time 4   Period Weeks   PT LONG TERM GOAL #2   Title Pt to be confident to ambulate without an assistive device   Time 4   Period Weeks   PT LONG TERM GOAL #3   Title Volume to be decreased by 50 %   Time 4   Period Weeks               Plan - 05-19-2015 1645    Clinical Impression Statement Pt is a 69 yo male who underwent a Rt TKR in March.  He has been referred to skilled PT.  He is currently wearing compression stockings but states that his main concern is the sweling in his leg.  Examination reveals increased edema, decrased balance and decreased glutius maximus and hamstring mm strength.  [name]  [name] will benefit from skilled PT to address these issues and return him to his maximal functioning status.    Pt will benefit from skilled therapeutic intervention in order to improve on the following deficits Decreased balance;Decreased strength;Decreased range of motion;Increased edema   Rehab Potential Good   PT Frequency 3x / week   PT Duration 4 weeks   PT Treatment/Interventions Therapeutic activities;Functional mobility training;Gait training;Stair training;Therapeutic exercise;Balance training;Manual techniques   PT Next Visit Plan begin heel toe gt training, sit to stand with Rt LE in back, SLS, tandem stance with last 30 minutes of treatemnt dedicated to manual to decrease swelling.            G-Codes - May 19, 2015 1654    Functional Assessment Tool Used foto    Functional Limitation Mobility: Walking and moving around   Mobility: Walking and Moving Around Current Status [phone]) At least 20 percent but less than 40 percent impaired, limited or  restricted   Mobility: Walking and Moving Around Goal Status [phone]) At least 20 percent but less than 40 percent impaired, limited or restricted       Problem List Patient Active Problem List   Diagnosis Date Noted  . Edema 03/11/2015  . Diastolic CHF, chronic 73/41/9379  . 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 Muscle weakness (generalized) 01/13/2014  . Knee stiffness 01/13/2014  . Gout 12/11/2013  . Cellulitis 11/19/2013  . Expected blood loss anemia 11/17/2013  . Obese 11/17/2013  . S/P right TKA 11/15/2013  . Anemia, normocytic normochromic 01/12/2013  . Diabetes mellitus 12/10/2012  . 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 Overweight(278.02) 11/17/2012  . Obstructive sleep apnea 11/15/2012  . Chronic kidney disease, stage 3, cardiorenal syndrome 08/23/2011  . Hypertension    Rayetta Humphrey, [place] CLT [phone] May 19, 2015, 4:55 PM  [facility] [address] [place], [place], [address] Phone: [phone]   Fax:  [phone]

## 2015-05-08 ENCOUNTER — Ambulatory Visit (HOSPITAL_COMMUNITY): Payer: Medicare Other | Admitting: Physical Therapy

## 2015-05-08 DIAGNOSIS — M25669 Stiffness of unspecified knee, not elsewhere classified: Secondary | ICD-10-CM

## 2015-05-08 DIAGNOSIS — M6281 Muscle weakness (generalized): Secondary | ICD-10-CM | POA: Diagnosis not present

## 2015-05-08 DIAGNOSIS — R609 Edema, unspecified: Secondary | ICD-10-CM | POA: Diagnosis not present

## 2015-05-08 DIAGNOSIS — M25662 Stiffness of left knee, not elsewhere classified: Secondary | ICD-10-CM

## 2015-05-08 NOTE — Therapy (Signed)
[place], [place], [address] Phone: [phone]   Fax:  [phone]  Physical Therapy Treatment  Patient Details  Name: [name] MRN: [id number] Date of Birth: [birth year] Referring Provider:  Ginger Organ  Encounter Date: 05/08/2015      PT End of Session - 05/08/15 1221    Visit Number 2   Number of Visits 12   Date for PT Re-Evaluation 06/02/15   Authorization Type [employer] medicare   Authorization - Visit Number 2   Authorization - Number of Visits 10   PT Start Time 1022   PT Stop Time 1102   PT Time Calculation (min) 40 min   Activity Tolerance Patient tolerated treatment well      Past Medical History  Diagnosis Date  . Diastolic heart failure     Pedal edema, "a long time ago"  . Hypertension     Exercise induced  . NSVT (nonsustained ventricular tachycardia)     Exercise induced  . 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 Obesity   . Degenerative joint disease   . Benign prostatic hypertrophy     Nocturia  . Erectile dysfunction   . High cholesterol   . CAD (coronary artery disease)     a.  NSTEMI 10/2012 s/p DES to LAD & DES to 1st diagonal with residual diffuse nonobstructive dz in LCx/RCA   . Chronic kidney disease, stage 3, mod decreased GFR     Creatinine of 1.5 in 03/2009  . Diabetes mellitus, type II 12/10/2012.  Marland Kitchen Anxiety   . Depression     hx of  . Flu 2013    hx of  . Hemorrhoids   . Tubular adenoma 2014  . GERD (gastroesophageal reflux disease)     Past Surgical History  Procedure Laterality Date  . Cardiac catheterization      with stent placement  . Back surgery  2007    x2  . Hernia repair    . Total knee arthroplasty Right 11/15/2013    Procedure: RIGHT TOTAL KNEE ARTHROPLASTY;  Surgeon: Mauri Pole, MD;  Location: [facility];  Service: Orthopedics;  Laterality: Right;  . Colonoscopy with esophagogastroduodenoscopy (egd) N/A  11/23/2013    Dr. Oneida Alar- TCS= left sided colitis and mild proctitis, moderate internal hemorrhoids, small nodule in the rectum, bx=tubular adenoma, EGD=-mild non-erosive gastritis  . Left heart catheterization with coronary angiogram N/A 11/16/2012    Procedure: LEFT HEART CATHETERIZATION WITH CORONARY ANGIOGRAM;  Surgeon: Sherren Mocha, MD;  Location: [facility] CATH LAB;  Service: Cardiovascular;  Laterality: N/A;  . Percutaneous coronary stent intervention (pci-s)  11/16/2012    Procedure: PERCUTANEOUS CORONARY STENT INTERVENTION (PCI-S);  Surgeon: Sherren Mocha, MD;  Location: [facility] CATH LAB;  Service: Cardiovascular;;  . Total knee arthroplasty Left 03/07/2015    Procedure: LEFT TOTAL KNEE ARTHROPLASTY;  Surgeon: Paralee Cancel, MD;  Location: [facility];  Service: Orthopedics;  Laterality: Left;    There were no vitals filed for this visit.  Visit Diagnosis:  Muscle weakness (generalized)  Knee stiffness, left  Edema  Knee stiffness, unspecified laterality      Subjective Assessment - 05/08/15 1225    Subjective Pt reports no pain.  STates he is going to the [facility] and completing the bike and other exercises.  Pt comes today wtih knee high compression garments requesting therapist donn after session.   Currently in Pain? No/denies  [place] Adult PT Treatment/Exercise - 05/08/15 1219    Manual Therapy   Manual Therapy Manual Lymphatic Drainage (MLD)   Manual Lymphatic Drainage (MLD) for Bilateral LE's including supraclavicular, deep and superficial abdominal, routing fluid using  Lt inguinal-axillary anastomosis                 PT Education - 05/08/15 1220    Education provided Yes   Education Details mechanism of lymphedema and how manual lymph drainage helps remove fluid.     Person(s) Educated Patient   Methods Explanation   Comprehension Verbalized understanding;Need further instruction          PT Short Term Goals - 05/03/15 1651     PT SHORT TERM GOAL #1   Title I HEP    Time 2   Period Weeks   PT SHORT TERM GOAL #2   Title Pt to be able to  SLS x 5 seconds for reduce risk of falling    Time 2   Period Weeks   PT SHORT TERM GOAL #3   Title Pt fluid volume to be decreased by 30%   Time 2   Period Weeks   PT SHORT TERM GOAL #4   Title ROM to be increased to 120 to allow pt to squat to the floor to pick up items   Time 2   Period Weeks           PT Long Term Goals - 05/03/15 1652    PT LONG TERM GOAL #1   Title Pt SLS to be 10 seconds for reduced risk of falling    Time 4   Period Weeks   PT LONG TERM GOAL #2   Title Pt to be confident to ambulate without an assistive device   Time 4   Period Weeks   PT LONG TERM GOAL #3   Title Volume to be decreased by 50 %   Time 4   Period Weeks               Plan - 05/08/15 1222    Clinical Impression Statement Manual lymph drainage completed for bilateral LE's.  PT unable to don knee high compression garments.  These were donned by therapist at end of session . Explained importance of keeping compression on when working out at [employer].  Pt has caregiver that usually dons his garments.  Most swelling is around feet and ankles with little induration noted.       PT Next Visit Plan Begin heel toe gt training, sit to stand with Rt LE in back, SLS, tandem stance with last 30 minutes of treatment dedicated to manual to decrease swelling.          Problem List Patient Active Problem List   Diagnosis Date Noted  . Edema 03/11/2015  . Diastolic CHF, chronic 57/12/7791  . 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 Muscle weakness (generalized) 01/13/2014  . Knee stiffness 01/13/2014  . Gout 12/11/2013  . Cellulitis 11/19/2013  . Expected blood loss anemia 11/17/2013  . Obese 11/17/2013  . S/P right TKA 11/15/2013  . Anemia, normocytic normochromic 01/12/2013  . Diabetes mellitus 12/10/2012  . 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  Overweight(278.02) 11/17/2012  . Obstructive sleep apnea 11/15/2012  . Chronic kidney disease, stage 3, cardiorenal syndrome 08/23/2011  . Hypertension     Teena Irani, PTA/CLT [phone] 05/08/2015, 12:26 PM  [place], [place],  [place] Phone: [phone]   Fax:  [phone]

## 2015-05-12 ENCOUNTER — Ambulatory Visit (HOSPITAL_COMMUNITY): Payer: Medicare Other | Admitting: Physical Therapy

## 2015-05-12 DIAGNOSIS — M25662 Stiffness of left knee, not elsewhere classified: Secondary | ICD-10-CM | POA: Diagnosis not present

## 2015-05-12 DIAGNOSIS — M6281 Muscle weakness (generalized): Secondary | ICD-10-CM | POA: Diagnosis not present

## 2015-05-12 DIAGNOSIS — R609 Edema, unspecified: Secondary | ICD-10-CM | POA: Diagnosis not present

## 2015-05-12 NOTE — Therapy (Signed)
[place], [place], [address] Phone: [phone]   Fax:  [phone]  Physical Therapy Treatment  Patient Details  Name: [name] MRN: [id number] Date of Birth: 02/11/1946 Referring Provider:  Paralee Cancel, MD  Encounter Date: 05/12/2015      PT End of Session - 05/12/15 1616    Visit Number 3   Number of Visits 12   Date for PT Re-Evaluation 06/02/15   Authorization Type [place] - Visit Number 3   Authorization - Number of Visits 10   PT Start Time 1751   PT Stop Time 0258   PT Time Calculation (min) 44 min      Past Medical History  Diagnosis Date  . Diastolic heart failure     Pedal edema, "a long time ago"  . Hypertension     Exercise induced  . NSVT (nonsustained ventricular tachycardia)     Exercise induced  . 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 Obesity   . Degenerative joint disease   . Benign prostatic hypertrophy     Nocturia  . Erectile dysfunction   . High cholesterol   . CAD (coronary artery disease)     a.  NSTEMI 10/2012 s/p DES to LAD & DES to 1st diagonal with residual diffuse nonobstructive dz in LCx/RCA   . Chronic kidney disease, stage 3, mod decreased GFR     Creatinine of 1.5 in 03/2009  . Diabetes mellitus, type II 12/10/2012.  Marland Kitchen Anxiety   . Depression     hx of  . Flu 2013    hx of  . Hemorrhoids   . Tubular adenoma 2014  . GERD (gastroesophageal reflux disease)     Past Surgical History  Procedure Laterality Date  . Cardiac catheterization      with stent placement  . Back surgery  2007    x2  . Hernia repair    . Total knee arthroplasty Right 11/15/2013    Procedure: RIGHT TOTAL KNEE ARTHROPLASTY;  Surgeon: Mauri Pole, MD;  Location: [facility];  Service: Orthopedics;  Laterality: Right;  . Colonoscopy with esophagogastroduodenoscopy (egd) N/A 11/23/2013    Dr. Oneida Alar- TCS= left sided colitis and mild  proctitis, moderate internal hemorrhoids, small nodule in the rectum, bx=tubular adenoma, EGD=-mild non-erosive gastritis  . Left heart catheterization with coronary angiogram N/A 11/16/2012    Procedure: LEFT HEART CATHETERIZATION WITH CORONARY ANGIOGRAM;  Surgeon: Sherren Mocha, MD;  Location: [facility] CATH LAB;  Service: Cardiovascular;  Laterality: N/A;  . Percutaneous coronary stent intervention (pci-s)  11/16/2012    Procedure: PERCUTANEOUS CORONARY STENT INTERVENTION (PCI-S);  Surgeon: Sherren Mocha, MD;  Location: [facility] CATH LAB;  Service: Cardiovascular;;  . Total knee arthroplasty Left 03/07/2015    Procedure: LEFT TOTAL KNEE ARTHROPLASTY;  Surgeon: Paralee Cancel, MD;  Location: [facility];  Service: Orthopedics;  Laterality: Left;    There were no vitals filed for this visit.  Visit Diagnosis:  Muscle weakness (generalized)  Knee stiffness, left  Edema      Subjective Assessment - 05/12/15 1437    Subjective Pt continues to go to the [facility].  He states his knee is swollen so he could not use the bike he had to use the nustep. But then states that it was easier to put his compression socks on.   Currently in Pain? No/denies  [place] Adult PT Treatment/Exercise - 05/12/15 0001    Exercises   Exercises Knee/Hip   Knee/Hip Exercises: Standing   Heel Raises 10 reps   Knee Flexion Left;10 reps   Other Standing Knee Exercises tandem stance; sit to stand x 10    Other Standing Knee Exercises heel toe gt pattern   Knee/Hip Exercises: Seated   Other Seated Knee Exercises sit to stand x 10   Manual Therapy   Manual Therapy Manual Lymphatic Drainage (MLD)   Manual Lymphatic Drainage (MLD) Lt LE including supraclavicular, deep and superficial abdominal, routing fluid using  Lt inguinal-axillary anastomosis                 PT Education - 05/12/15 1615    Education provided Yes   Education Details the importance of heel toe gait patter    Person(s) Educated Patient    Methods Explanation;Demonstration   Comprehension Verbalized understanding;Returned demonstration          PT Short Term Goals - 05/03/15 1651    PT SHORT TERM GOAL #1   Title I HEP    Time 2   Period Weeks   PT SHORT TERM GOAL #2   Title Pt to be able to  SLS x 5 seconds for reduce risk of falling    Time 2   Period Weeks   PT SHORT TERM GOAL #3   Title Pt fluid volume to be decreased by 30%   Time 2   Period Weeks   PT SHORT TERM GOAL #4   Title ROM to be increased to 120 to allow pt to squat to the floor to pick up items   Time 2   Period Weeks           PT Long Term Goals - 05/03/15 1652    PT LONG TERM GOAL #1   Title Pt SLS to be 10 seconds for reduced risk of falling    Time 4   Period Weeks   PT LONG TERM GOAL #2   Title Pt to be confident to ambulate without an assistive device   Time 4   Period Weeks   PT LONG TERM GOAL #3   Title Volume to be decreased by 50 %   Time 4   Period Weeks               Plan - 05/12/15 1617    Clinical Impression Statement Noted decreased swelling of Rt LE therefore manual decongestive treatment focused on Lt.  Began glut and balance activity today to address these deficits. Explained the importance of heel toe gt pattern in decreasing edema with pt verbalizing understanding .    PT Next Visit Plan Begin heel toe gt training, sit to stand , SLS, tandem stance with last 30 minutes of treatment dedicated to manual to decrease swelling.          Problem List Patient Active Problem List   Diagnosis Date Noted  . Edema 03/11/2015  . Diastolic CHF, chronic 72/53/6644  . 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 Muscle weakness (generalized) 01/13/2014  . Knee stiffness 01/13/2014  . Gout 12/11/2013  . Cellulitis 11/19/2013  . Expected blood loss anemia 11/17/2013  . Obese 11/17/2013  . S/P right TKA 11/15/2013  . Anemia, normocytic normochromic 01/12/2013  . Diabetes mellitus 12/10/2012  . Arteriosclerotic cardiovascular  disease (ASCVD) 11/24/2012  . Overweight(278.02) 11/17/2012  . Obstructive sleep apnea 11/15/2012  . Chronic kidney disease, stage 3, cardiorenal syndrome  08/23/2011  . Hypertension   Rayetta Humphrey, [place] CLT [phone] 05/12/2015, 4:21 PM  [place] [address] [place], [place], [address] Phone: [phone]   Fax:  [phone]

## 2015-05-15 ENCOUNTER — Ambulatory Visit (HOSPITAL_COMMUNITY): Payer: Medicare Other | Admitting: Physical Therapy

## 2015-05-15 DIAGNOSIS — M25662 Stiffness of left knee, not elsewhere classified: Secondary | ICD-10-CM

## 2015-05-15 DIAGNOSIS — M6281 Muscle weakness (generalized): Secondary | ICD-10-CM

## 2015-05-15 DIAGNOSIS — M25669 Stiffness of unspecified knee, not elsewhere classified: Secondary | ICD-10-CM

## 2015-05-15 DIAGNOSIS — R609 Edema, unspecified: Secondary | ICD-10-CM | POA: Diagnosis not present

## 2015-05-15 NOTE — Therapy (Signed)
[place], [place], [address] Phone: [phone]   Fax:  [phone]  Physical Therapy Treatment  Patient Details  Name: [name] MRN: [id number] Date of Birth: 1946/04/09 Referring Provider:  Cory Munch, PA-C  Encounter Date: 05/15/2015      PT End of Session - 05/15/15 1431    Visit Number 4   Number of Visits 12   Date for PT Re-Evaluation 06/02/15   Authorization Type [employer] medicare   Authorization - Visit Number 4   Authorization - Number of Visits 10   PT Start Time 1020   PT Stop Time 1110   PT Time Calculation (min) 50 min   Activity Tolerance Patient tolerated treatment well   Behavior During Therapy [facility] for tasks assessed/performed      Past Medical History  Diagnosis Date  . Diastolic heart failure     Pedal edema, "a long time ago"  . Hypertension     Exercise induced  . NSVT (nonsustained ventricular tachycardia)     Exercise induced  . 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 Obesity   . Degenerative joint disease   . Benign prostatic hypertrophy     Nocturia  . Erectile dysfunction   . High cholesterol   . CAD (coronary artery disease)     a.  NSTEMI 10/2012 s/p DES to LAD & DES to 1st diagonal with residual diffuse nonobstructive dz in LCx/RCA   . Chronic kidney disease, stage 3, mod decreased GFR     Creatinine of 1.5 in 03/2009  . Diabetes mellitus, type II 12/10/2012.  Marland Kitchen Anxiety   . Depression     hx of  . Flu 2013    hx of  . Hemorrhoids   . Tubular adenoma 2014  . GERD (gastroesophageal reflux disease)     Past Surgical History  Procedure Laterality Date  . Cardiac catheterization      with stent placement  . Back surgery  2007    x2  . Hernia repair    . Total knee arthroplasty Right 11/15/2013    Procedure: RIGHT TOTAL KNEE ARTHROPLASTY;  Surgeon: Mauri Pole, MD;  Location: [facility];  Service: Orthopedics;  Laterality: Right;  .  Colonoscopy with esophagogastroduodenoscopy (egd) N/A 11/23/2013    Dr. Oneida Alar- TCS= left sided colitis and mild proctitis, moderate internal hemorrhoids, small nodule in the rectum, bx=tubular adenoma, EGD=-mild non-erosive gastritis  . Left heart catheterization with coronary angiogram N/A 11/16/2012    Procedure: LEFT HEART CATHETERIZATION WITH CORONARY ANGIOGRAM;  Surgeon: Sherren Mocha, MD;  Location: [facility] CATH LAB;  Service: Cardiovascular;  Laterality: N/A;  . Percutaneous coronary stent intervention (pci-s)  11/16/2012    Procedure: PERCUTANEOUS CORONARY STENT INTERVENTION (PCI-S);  Surgeon: Sherren Mocha, MD;  Location: [facility] CATH LAB;  Service: Cardiovascular;;  . Total knee arthroplasty Left 03/07/2015    Procedure: LEFT TOTAL KNEE ARTHROPLASTY;  Surgeon: Paralee Cancel, MD;  Location: [facility];  Service: Orthopedics;  Laterality: Left;    There were no vitals filed for this visit.  Visit Diagnosis:  Muscle weakness (generalized)  Knee stiffness, left  Edema  Knee stiffness, unspecified laterality      Subjective Assessment - 05/15/15 1434    Subjective Pt states he got a good 20 minute massage last visit.  Pt comes with his stockings today for therapist to donn following manual.  Pt states he would like to  use the bike today.   Currently in Pain? No/denies                         [facility] Adult PT Treatment/Exercise - 05/15/15 1430    Knee/Hip Exercises: Aerobic   Stationary Bike seat 16 8 minutes full revolutions   Manual Therapy   Manual Therapy Manual Lymphatic Drainage (MLD)   Manual Lymphatic Drainage (MLD) Lt LE including supraclavicular, deep and superficial abdominal, routing fluid using  Lt inguinal-axillary anastomosis                   PT Short Term Goals - 05/03/15 1651    PT SHORT TERM GOAL #1   Title I HEP    Time 2   Period Weeks   PT SHORT TERM GOAL #2   Title Pt to be able to  SLS x 5 seconds for reduce risk of falling    Time 2    Period Weeks   PT SHORT TERM GOAL #3   Title Pt fluid volume to be decreased by 30%   Time 2   Period Weeks   PT SHORT TERM GOAL #4   Title ROM to be increased to 120 to allow pt to squat to the floor to pick up items   Time 2   Period Weeks           PT Long Term Goals - 05/03/15 1652    PT LONG TERM GOAL #1   Title Pt SLS to be 10 seconds for reduced risk of falling    Time 4   Period Weeks   PT LONG TERM GOAL #2   Title Pt to be confident to ambulate without an assistive device   Time 4   Period Weeks   PT LONG TERM GOAL #3   Title Volume to be decreased by 50 %   Time 4   Period Weeks               Plan - 05/15/15 1431    Clinical Impression Statement Manual lymph drainage for bilateral LE completed followed by donning of compression knee highs.  Unable to complete therex due to time (pt is going to [facility]).  Pt requested to complete bike to work on ROM today.  Pt was able to make full revolutions.    PT Next Visit Plan Progress therex to increase LE strength and stability (i.e. STS, SLS, tandem stance) with last 30 minutes dedicated to manual lymph drainage.        Problem List Patient Active Problem List   Diagnosis Date Noted  . Edema 03/11/2015  . Diastolic CHF, chronic 76/28/3151  . 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 Muscle weakness (generalized) 01/13/2014  . Knee stiffness 01/13/2014  . Gout 12/11/2013  . Cellulitis 11/19/2013  . Expected blood loss anemia 11/17/2013  . Obese 11/17/2013  . S/P right TKA 11/15/2013  . Anemia, normocytic normochromic 01/12/2013  . Diabetes mellitus 12/10/2012  . 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 Overweight(278.02) 11/17/2012  . Obstructive sleep apnea 11/15/2012  . Chronic kidney disease, stage 3, cardiorenal syndrome 08/23/2011  . Hypertension     Teena Irani, PTA/CLT [phone]  05/15/2015, 2:36 PM  [place], [place], [address] Phone: [phone]   Fax:  [phone]

## 2015-05-17 ENCOUNTER — Ambulatory Visit (HOSPITAL_COMMUNITY): Payer: Medicare Other | Admitting: Physical Therapy

## 2015-05-17 DIAGNOSIS — R609 Edema, unspecified: Secondary | ICD-10-CM | POA: Diagnosis not present

## 2015-05-17 DIAGNOSIS — M25669 Stiffness of unspecified knee, not elsewhere classified: Secondary | ICD-10-CM

## 2015-05-17 DIAGNOSIS — M6281 Muscle weakness (generalized): Secondary | ICD-10-CM | POA: Diagnosis not present

## 2015-05-17 DIAGNOSIS — M25662 Stiffness of left knee, not elsewhere classified: Secondary | ICD-10-CM | POA: Diagnosis not present

## 2015-05-17 NOTE — Therapy (Signed)
[place], [place], [address] Phone: [phone]   Fax:  [phone]  Physical Therapy Treatment  Patient Details  Name: [name] MRN: [id number] Date of Birth: [birth year] Referring Provider:  Ginger Organ  Encounter Date: 05/17/2015      PT End of Session - 05/17/15 1635    Visit Number 5   Number of Visits 12   Date for PT Re-Evaluation 06/02/15   Authorization Type [employer] medicare   Authorization - Visit Number 5   Authorization - Number of Visits 10   PT Start Time 1520   PT Stop Time 1622   PT Time Calculation (min) 62 min   Activity Tolerance Patient tolerated treatment well   Behavior During Therapy [facility] for tasks assessed/performed      Past Medical History  Diagnosis Date  . Diastolic heart failure     Pedal edema, "a long time ago"  . Hypertension     Exercise induced  . NSVT (nonsustained ventricular tachycardia)     Exercise induced  . 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 Obesity   . Degenerative joint disease   . Benign prostatic hypertrophy     Nocturia  . Erectile dysfunction   . High cholesterol   . CAD (coronary artery disease)     a.  NSTEMI 10/2012 s/p DES to LAD & DES to 1st diagonal with residual diffuse nonobstructive dz in LCx/RCA   . Chronic kidney disease, stage 3, mod decreased GFR     Creatinine of 1.5 in 03/2009  . Diabetes mellitus, type II 12/10/2012.  Marland Kitchen Anxiety   . Depression     hx of  . Flu 2013    hx of  . Hemorrhoids   . Tubular adenoma 2014  . GERD (gastroesophageal reflux disease)     Past Surgical History  Procedure Laterality Date  . Cardiac catheterization      with stent placement  . Back surgery  2007    x2  . Hernia repair    . Total knee arthroplasty Right 11/15/2013    Procedure: RIGHT TOTAL KNEE ARTHROPLASTY;  Surgeon: Mauri Pole, MD;  Location: [facility];  Service: Orthopedics;  Laterality: Right;  .  Colonoscopy with esophagogastroduodenoscopy (egd) N/A 11/23/2013    Dr. Oneida Alar- TCS= left sided colitis and mild proctitis, moderate internal hemorrhoids, small nodule in the rectum, bx=tubular adenoma, EGD=-mild non-erosive gastritis  . Left heart catheterization with coronary angiogram N/A 11/16/2012    Procedure: LEFT HEART CATHETERIZATION WITH CORONARY ANGIOGRAM;  Surgeon: Sherren Mocha, MD;  Location: [facility] CATH LAB;  Service: Cardiovascular;  Laterality: N/A;  . Percutaneous coronary stent intervention (pci-s)  11/16/2012    Procedure: PERCUTANEOUS CORONARY STENT INTERVENTION (PCI-S);  Surgeon: Sherren Mocha, MD;  Location: [facility] CATH LAB;  Service: Cardiovascular;;  . Total knee arthroplasty Left 03/07/2015    Procedure: LEFT TOTAL KNEE ARTHROPLASTY;  Surgeon: Paralee Cancel, MD;  Location: [facility];  Service: Orthopedics;  Laterality: Left;    There were no vitals filed for this visit.  Visit Diagnosis:  Muscle weakness (generalized)  Knee stiffness, left  Edema  Knee stiffness, unspecified laterality      Subjective Assessment - 05/17/15 1629    Subjective Pt states his knee is more swollen today than last visit. States he didnt get to work out today at [employer] and would like to do more exercise today if  possible.  Pt with questions regarding compression garments, lymphedema and donning aids.   Currently in Pain? No/denies                         [facility] Adult PT Treatment/Exercise - 05/17/15 1632    Knee/Hip Exercises: Aerobic   Stationary Bike nustep seat 16 UE's 14 10 minutes level #3 hills #3   Knee/Hip Exercises: Standing   Heel Raises 15 reps   Knee Flexion Left;10 reps   SLS bilaterally with 1 finger hold 15" X 3 each LE   Other Standing Knee Exercises tandem stance 1X each LE lead 30" with max of 5" without UE assist; sit to stand x 10 no UE   Manual Therapy   Manual Therapy Manual Lymphatic Drainage (MLD)   Manual Lymphatic Drainage (MLD) Lt LE including  supraclavicular, deep and superficial abdominal, routing fluid using  Lt inguinal-axillary anastomosis                 PT Education - 05/17/15 1641    Education provided Yes   Education Details lymphedema, compression garments and donning aids.  PT given pamphlet for outlet in [place] if decides to get thigh high hose/better fitting hose (his are too short)   Person(s) Educated Patient   Methods Explanation;Handout   Comprehension Verbalized understanding          PT Short Term Goals - 05/03/15 1651    PT SHORT TERM GOAL #1   Title I HEP    Time 2   Period Weeks   PT SHORT TERM GOAL #2   Title Pt to be able to  SLS x 5 seconds for reduce risk of falling    Time 2   Period Weeks   PT SHORT TERM GOAL #3   Title Pt fluid volume to be decreased by 30%   Time 2   Period Weeks   PT SHORT TERM GOAL #4   Title ROM to be increased to 120 to allow pt to squat to the floor to pick up items   Time 2   Period Weeks           PT Long Term Goals - 05/03/15 1652    PT LONG TERM GOAL #1   Title Pt SLS to be 10 seconds for reduced risk of falling    Time 4   Period Weeks   PT LONG TERM GOAL #2   Title Pt to be confident to ambulate without an assistive device   Time 4   Period Weeks   PT LONG TERM GOAL #3   Title Volume to be decreased by 50 %   Time 4   Period Weeks               Plan - 05/17/15 1636    Clinical Impression Statement LE therex completed first.  Added SLS and tandem stance.  PT reluctant to attempt either but able to convince.  Unable to complete without 1 finger assist for SLS and difficulty balancing greater than 5" with tandem stance.  Pt educated on compression stockings, lymphedema and donning aids with demonstration.  States he has a sock buttler and will try to find it for some training.  Pt would benefit from thigh highs, however he is not agreeble to this.  Ended session with nustep to increase actvity tolerance.    PT Next Visit Plan  Progress therex to increase LE strength and stability with last  30 minutes dedicated to manual lymph drainage.        Problem List Patient Active Problem List   Diagnosis Date Noted  . Edema 03/11/2015  . Diastolic CHF, chronic 15/04/6978  . 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 Muscle weakness (generalized) 01/13/2014  . Knee stiffness 01/13/2014  . Gout 12/11/2013  . Cellulitis 11/19/2013  . Expected blood loss anemia 11/17/2013  . Obese 11/17/2013  . S/P right TKA 11/15/2013  . Anemia, normocytic normochromic 01/12/2013  . Diabetes mellitus 12/10/2012  . 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 Overweight(278.02) 11/17/2012  . Obstructive sleep apnea 11/15/2012  . Chronic kidney disease, stage 3, cardiorenal syndrome 08/23/2011  . Hypertension     Teena Irani, PTA/CLT [phone] 05/17/2015, 4:43 PM  [place] [address] [place], [place], [address] Phone: [phone]   Fax:  [phone]

## 2015-05-24 ENCOUNTER — Ambulatory Visit (HOSPITAL_COMMUNITY): Payer: Medicare Other | Attending: Internal Medicine | Admitting: Physical Therapy

## 2015-05-24 DIAGNOSIS — M25662 Stiffness of left knee, not elsewhere classified: Secondary | ICD-10-CM | POA: Insufficient documentation

## 2015-05-24 DIAGNOSIS — M6281 Muscle weakness (generalized): Secondary | ICD-10-CM | POA: Insufficient documentation

## 2015-05-24 DIAGNOSIS — R609 Edema, unspecified: Secondary | ICD-10-CM | POA: Diagnosis not present

## 2015-05-24 DIAGNOSIS — M25669 Stiffness of unspecified knee, not elsewhere classified: Secondary | ICD-10-CM

## 2015-05-24 NOTE — Therapy (Signed)
[place], [place], [address] Phone: [phone]   Fax:  [phone]  Physical Therapy Treatment  Patient Details  Name: [name] MRN: [id number] Date of Birth: Sep 12, 1946 Referring Provider:  Ginger Organ  Encounter Date: 05/24/2015      PT End of Session - 05/24/15 1025    Visit Number 6   Number of Visits 12   Date for PT Re-Evaluation 06/02/15   Authorization Type [employer] medicare   Authorization - Visit Number 6   Authorization - Number of Visits 10   PT Start Time 8676   PT Stop Time 1030   PT Time Calculation (min) 55 min   Activity Tolerance Patient tolerated treatment well   Behavior During Therapy [facility] for tasks assessed/performed      Past Medical History  Diagnosis Date  . Diastolic heart failure     Pedal edema, "a long time ago"  . Hypertension     Exercise induced  . NSVT (nonsustained ventricular tachycardia)     Exercise induced  . 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 Obesity   . Degenerative joint disease   . Benign prostatic hypertrophy     Nocturia  . Erectile dysfunction   . High cholesterol   . CAD (coronary artery disease)     a.  NSTEMI 10/2012 s/p DES to LAD & DES to 1st diagonal with residual diffuse nonobstructive dz in LCx/RCA   . Chronic kidney disease, stage 3, mod decreased GFR     Creatinine of 1.5 in 03/2009  . Diabetes mellitus, type II 12/10/2012.  Marland Kitchen Anxiety   . Depression     hx of  . Flu 2013    hx of  . Hemorrhoids   . Tubular adenoma 2014  . GERD (gastroesophageal reflux disease)     Past Surgical History  Procedure Laterality Date  . Cardiac catheterization      with stent placement  . Back surgery  2007    x2  . Hernia repair    . Total knee arthroplasty Right 11/15/2013    Procedure: RIGHT TOTAL KNEE ARTHROPLASTY;  Surgeon: Mauri Pole, MD;  Location: [facility];  Service: Orthopedics;  Laterality: Right;  .  Colonoscopy with esophagogastroduodenoscopy (egd) N/A 11/23/2013    Dr. Oneida Alar- TCS= left sided colitis and mild proctitis, moderate internal hemorrhoids, small nodule in the rectum, bx=tubular adenoma, EGD=-mild non-erosive gastritis  . Left heart catheterization with coronary angiogram N/A 11/16/2012    Procedure: LEFT HEART CATHETERIZATION WITH CORONARY ANGIOGRAM;  Surgeon: Sherren Mocha, MD;  Location: [facility] CATH LAB;  Service: Cardiovascular;  Laterality: N/A;  . Percutaneous coronary stent intervention (pci-s)  11/16/2012    Procedure: PERCUTANEOUS CORONARY STENT INTERVENTION (PCI-S);  Surgeon: Sherren Mocha, MD;  Location: [facility] CATH LAB;  Service: Cardiovascular;;  . Total knee arthroplasty Left 03/07/2015    Procedure: LEFT TOTAL KNEE ARTHROPLASTY;  Surgeon: Paralee Cancel, MD;  Location: [facility];  Service: Orthopedics;  Laterality: Left;    There were no vitals filed for this visit.  Visit Diagnosis:  Muscle weakness (generalized)  Knee stiffness, left  Edema  Knee stiffness, unspecified laterality      Subjective Assessment - 05/24/15 0941    Subjective Pt states his legs feel "fat and tight" from the fluid.  PT states he is going to the pool at the [facility] when he leaves here today.  PT reports  has more pain in his back then his legs.     Currently in Pain? No/denies                         [facility] Adult PT Treatment/Exercise - 05/24/15 0001    Knee/Hip Exercises: Aerobic   Stationary Bike nustep seat 16 UE's 14 10 minutes level #3 hills #3   Knee/Hip Exercises: Standing   Heel Raises 15 reps   Knee Flexion Left;10 reps   Functional Squat 15 reps   Knee/Hip Exercises: Seated   Other Seated Knee Exercises sit to stand x 10   Manual Therapy   Manual Therapy Manual Lymphatic Drainage (MLD)   Manual Lymphatic Drainage (MLD) Bil LE including supraclavicular, deep and superficial abdominal, routing fluid using  Lt inguinal-axillary anastomosis                    PT Short Term Goals - 05/03/15 1651    PT SHORT TERM GOAL #1   Title I HEP    Time 2   Period Weeks   PT SHORT TERM GOAL #2   Title Pt to be able to  SLS x 5 seconds for reduce risk of falling    Time 2   Period Weeks   PT SHORT TERM GOAL #3   Title Pt fluid volume to be decreased by 30%   Time 2   Period Weeks   PT SHORT TERM GOAL #4   Title ROM to be increased to 120 to allow pt to squat to the floor to pick up items   Time 2   Period Weeks           PT Long Term Goals - 05/03/15 1652    PT LONG TERM GOAL #1   Title Pt SLS to be 10 seconds for reduced risk of falling    Time 4   Period Weeks   PT LONG TERM GOAL #2   Title Pt to be confident to ambulate without an assistive device   Time 4   Period Weeks   PT LONG TERM GOAL #3   Title Volume to be decreased by 50 %   Time 4   Period Weeks               Plan - 05/24/15 1025    Clinical Impression Statement Pt able to completle therex without diffiuculty.  Added squats and completed all others without use of UE's.  Most induration around bilateral ankles today. Pt is still trying to locate his sock butler but intends on buying one if he cannot find his.  PT completed nustep at end of session and then headed to the [facility] for aquatic exercise.   PT Next Visit Plan Progress therex to increase LE strength and stability with last 30 minutes dedicated to manual lymph drainage.        Problem List Patient Active Problem List   Diagnosis Date Noted  . Edema 03/11/2015  . Diastolic CHF, chronic 33/82/5053  . 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 Muscle weakness (generalized) 01/13/2014  . Knee stiffness 01/13/2014  . Gout 12/11/2013  . Cellulitis 11/19/2013  . Expected blood loss anemia 11/17/2013  . Obese 11/17/2013  . S/P right TKA 11/15/2013  . Anemia, normocytic normochromic 01/12/2013  . Diabetes mellitus 12/10/2012  . 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 Overweight(278.02) 11/17/2012   . Obstructive sleep apnea 11/15/2012  . Chronic kidney disease, stage 3,  cardiorenal syndrome 08/23/2011  . Hypertension     Teena Irani, PTA/CLT [phone] 05/24/2015, 10:30 AM  [place], [place], [address] Phone: [phone]   Fax:  [phone]

## 2015-05-26 ENCOUNTER — Encounter (HOSPITAL_COMMUNITY): Payer: Self-pay | Admitting: Physical Therapy

## 2015-05-29 ENCOUNTER — Ambulatory Visit (HOSPITAL_COMMUNITY): Payer: Medicare Other | Admitting: Physical Therapy

## 2015-05-29 DIAGNOSIS — M25669 Stiffness of unspecified knee, not elsewhere classified: Secondary | ICD-10-CM

## 2015-05-29 DIAGNOSIS — M25662 Stiffness of left knee, not elsewhere classified: Secondary | ICD-10-CM

## 2015-05-29 DIAGNOSIS — M6281 Muscle weakness (generalized): Secondary | ICD-10-CM | POA: Diagnosis not present

## 2015-05-29 DIAGNOSIS — R609 Edema, unspecified: Secondary | ICD-10-CM | POA: Diagnosis not present

## 2015-05-29 NOTE — Therapy (Signed)
[place] [address] [place], [place], [address] Phone: [phone]   Fax:  [phone]  Physical Therapy Treatment  Patient Details  Name: [name] MRN: [id number] Date of Birth: 02-20-1946 Referring Provider:  Cory Munch, PA-C  Encounter Date: 05/29/2015      PT End of Session - 05/29/15 0959    Visit Number 7   Number of Visits 12   Date for PT Re-Evaluation 06/02/15   Authorization Type [employer] medicare   Authorization - Visit Number 7   Authorization - Number of Visits 10   PT Start Time [phone]   PT Stop Time 1040   PT Time Calculation (min) 64 min   Activity Tolerance Patient tolerated treatment well   Behavior During Therapy [facility] for tasks assessed/performed      Past Medical History  Diagnosis Date  . Diastolic heart failure     Pedal edema, "a long time ago"  . Hypertension     Exercise induced  . NSVT (nonsustained ventricular tachycardia)     Exercise induced  . 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 Obesity   . Degenerative joint disease   . Benign prostatic hypertrophy     Nocturia  . Erectile dysfunction   . High cholesterol   . CAD (coronary artery disease)     a.  NSTEMI 10/2012 s/p DES to LAD & DES to 1st diagonal with residual diffuse nonobstructive dz in LCx/RCA   . Chronic kidney disease, stage 3, mod decreased GFR     Creatinine of 1.5 in 03/2009  . Diabetes mellitus, type II 12/10/2012.  Marland Kitchen Anxiety   . Depression     hx of  . Flu 2013    hx of  . Hemorrhoids   . Tubular adenoma 2014  . GERD (gastroesophageal reflux disease)     Past Surgical History  Procedure Laterality Date  . Cardiac catheterization      with stent placement  . Back surgery  2007    x2  . Hernia repair    . Total knee arthroplasty Right 11/15/2013    Procedure: RIGHT TOTAL KNEE ARTHROPLASTY;  Surgeon: Mauri Pole, MD;  Location: [facility];  Service: Orthopedics;  Laterality: Right;  .  Colonoscopy with esophagogastroduodenoscopy (egd) N/A 11/23/2013    Dr. Oneida Alar- TCS= left sided colitis and mild proctitis, moderate internal hemorrhoids, small nodule in the rectum, bx=tubular adenoma, EGD=-mild non-erosive gastritis  . Left heart catheterization with coronary angiogram N/A 11/16/2012    Procedure: LEFT HEART CATHETERIZATION WITH CORONARY ANGIOGRAM;  Surgeon: Sherren Mocha, MD;  Location: [facility] CATH LAB;  Service: Cardiovascular;  Laterality: N/A;  . Percutaneous coronary stent intervention (pci-s)  11/16/2012    Procedure: PERCUTANEOUS CORONARY STENT INTERVENTION (PCI-S);  Surgeon: Sherren Mocha, MD;  Location: [facility] CATH LAB;  Service: Cardiovascular;;  . Total knee arthroplasty Left 03/07/2015    Procedure: LEFT TOTAL KNEE ARTHROPLASTY;  Surgeon: Paralee Cancel, MD;  Location: [facility];  Service: Orthopedics;  Laterality: Left;    There were no vitals filed for this visit.  Visit Diagnosis:  Muscle weakness (generalized)  Knee stiffness, left  Edema  Knee stiffness, unspecified laterality      Subjective Assessment - 05/29/15 1706    Subjective Pt states he's been enjoying the pool and it makes him urinate alot when he's done.  No pain currently in his LE's but has pain in his back all the  time.     Currently in Pain? No/denies                         [facility] Adult PT Treatment/Exercise - 05/29/15 0957    Knee/Hip Exercises: Stretches   Active Hamstring Stretch 3 reps;30 seconds   Active Hamstring Stretch Limitations onto 12" box   Knee/Hip Exercises: Aerobic   Stationary Bike nustep seat 16 UE's 14 10 minutes level #3 hills #3   Knee/Hip Exercises: Standing   Heel Raises 15 reps   Functional Squat 15 reps   Knee/Hip Exercises: Seated   Other Seated Knee Exercises sit to stand x 10   Manual Therapy   Manual Therapy Manual Lymphatic Drainage (MLD)   Manual Lymphatic Drainage (MLD) Bil LE including supraclavicular, deep and superficial abdominal,  routing fluid using  Lt inguinal-axillary anastomosis                   PT Short Term Goals - 05/03/15 1651    PT SHORT TERM GOAL #1   Title I HEP    Time 2   Period Weeks   PT SHORT TERM GOAL #2   Title Pt to be able to  SLS x 5 seconds for reduce risk of falling    Time 2   Period Weeks   PT SHORT TERM GOAL #3   Title Pt fluid volume to be decreased by 30%   Time 2   Period Weeks   PT SHORT TERM GOAL #4   Title ROM to be increased to 120 to allow pt to squat to the floor to pick up items   Time 2   Period Weeks           PT Long Term Goals - 05/03/15 1652    PT LONG TERM GOAL #1   Title Pt SLS to be 10 seconds for reduced risk of falling    Time 4   Period Weeks   PT LONG TERM GOAL #2   Title Pt to be confident to ambulate without an assistive device   Time 4   Period Weeks   PT LONG TERM GOAL #3   Title Volume to be decreased by 50 %   Time 4   Period Weeks               Plan - 05/29/15 1706    Clinical Impression Statement Pt questioning why his swelling has not went down in his ankles.  Explained to patient that he must be wearing his compression socks at all times.  Pt has not been wearing them since beginning aquatic therapy.  PT verbalized understanding.  Encouraged patient to continue pool therapy as well.    PT Next Visit Plan Progress therex to increase LE strength and stability with last 30 minutes dedicated to manual lymph drainage.  Measure for decompression this week.        Problem List Patient Active Problem List   Diagnosis Date Noted  . Edema 03/11/2015  . Diastolic CHF, chronic 03/50/0938  . 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 Muscle weakness (generalized) 01/13/2014  . Knee stiffness 01/13/2014  . Gout 12/11/2013  . Cellulitis 11/19/2013  . Expected blood loss anemia 11/17/2013  . Obese 11/17/2013  . S/P right TKA 11/15/2013  . Anemia, normocytic normochromic 01/12/2013  . Diabetes mellitus 12/10/2012  . Arteriosclerotic  cardiovascular disease (ASCVD) 11/24/2012  . Overweight(278.02) 11/17/2012  . Obstructive sleep apnea 11/15/2012  .  Chronic kidney disease, stage 3, cardiorenal syndrome 08/23/2011  . Hypertension     Teena Irani, PTA/CLT [phone]  05/29/2015, 5:09 PM  [place] [address] [place], [place], [address] Phone: [phone]   Fax:  [phone]

## 2015-05-31 ENCOUNTER — Ambulatory Visit (HOSPITAL_COMMUNITY): Payer: Medicare Other | Admitting: Physical Therapy

## 2015-05-31 DIAGNOSIS — M6281 Muscle weakness (generalized): Secondary | ICD-10-CM | POA: Diagnosis not present

## 2015-05-31 DIAGNOSIS — M25669 Stiffness of unspecified knee, not elsewhere classified: Secondary | ICD-10-CM

## 2015-05-31 DIAGNOSIS — M25662 Stiffness of left knee, not elsewhere classified: Secondary | ICD-10-CM

## 2015-05-31 DIAGNOSIS — R609 Edema, unspecified: Secondary | ICD-10-CM

## 2015-05-31 NOTE — Therapy (Signed)
[place], [place], [address] Phone: [phone]   Fax:  [phone]  Physical Therapy Treatment  Patient Details  Name: [name] MRN: [id number] Date of Birth: 1946-08-10 Referring Provider:  Cory Munch, PA-C  Encounter Date: 05/31/2015      PT End of Session - 05/31/15 1031    Visit Number 8   Number of Visits 12   Date for PT Re-Evaluation 06/02/15   Authorization Type [employer] medicare   Authorization - Visit Number 8   Authorization - Number of Visits 10   PT Start Time 6803   PT Stop Time 1045   PT Time Calculation (min) 70 min   Activity Tolerance Patient tolerated treatment well   Behavior During Therapy [facility] for tasks assessed/performed      Past Medical History  Diagnosis Date  . Diastolic heart failure     Pedal edema, "a long time ago"  . Hypertension     Exercise induced  . NSVT (nonsustained ventricular tachycardia)     Exercise induced  . 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 Obesity   . Degenerative joint disease   . Benign prostatic hypertrophy     Nocturia  . Erectile dysfunction   . High cholesterol   . CAD (coronary artery disease)     a.  NSTEMI 10/2012 s/p DES to LAD & DES to 1st diagonal with residual diffuse nonobstructive dz in LCx/RCA   . Chronic kidney disease, stage 3, mod decreased GFR     Creatinine of 1.5 in 03/2009  . Diabetes mellitus, type II 12/10/2012.  Marland Kitchen Anxiety   . Depression     hx of  . Flu 2013    hx of  . Hemorrhoids   . Tubular adenoma 2014  . GERD (gastroesophageal reflux disease)     Past Surgical History  Procedure Laterality Date  . Cardiac catheterization      with stent placement  . Back surgery  2007    x2  . Hernia repair    . Total knee arthroplasty Right 11/15/2013    Procedure: RIGHT TOTAL KNEE ARTHROPLASTY;  Surgeon: Mauri Pole, MD;  Location: [facility];  Service: Orthopedics;  Laterality: Right;  .  Colonoscopy with esophagogastroduodenoscopy (egd) N/A 11/23/2013    Dr. Oneida Alar- TCS= left sided colitis and mild proctitis, moderate internal hemorrhoids, small nodule in the rectum, bx=tubular adenoma, EGD=-mild non-erosive gastritis  . Left heart catheterization with coronary angiogram N/A 11/16/2012    Procedure: LEFT HEART CATHETERIZATION WITH CORONARY ANGIOGRAM;  Surgeon: Sherren Mocha, MD;  Location: [facility] CATH LAB;  Service: Cardiovascular;  Laterality: N/A;  . Percutaneous coronary stent intervention (pci-s)  11/16/2012    Procedure: PERCUTANEOUS CORONARY STENT INTERVENTION (PCI-S);  Surgeon: Sherren Mocha, MD;  Location: [facility] CATH LAB;  Service: Cardiovascular;;  . Total knee arthroplasty Left 03/07/2015    Procedure: LEFT TOTAL KNEE ARTHROPLASTY;  Surgeon: Paralee Cancel, MD;  Location: [facility];  Service: Orthopedics;  Laterality: Left;    There were no vitals filed for this visit.  Visit Diagnosis:  Knee stiffness, left  Muscle weakness (generalized)  Edema  Knee stiffness, unspecified laterality      Subjective Assessment - 05/31/15 0936    Subjective Pt states his back hurts all the time.  States he went to the pool yesterday and doesn't plan on coming today.  Pt comes with his garments to be donned  after therapy;   Currently in Pain? No/denies            [facility] PT Assessment - 05/31/15 0001    AROM   Left Knee Extension 5   Left Knee Flexion 105  PROM 115                     [facility] Adult PT Treatment/Exercise - 05/31/15 0942    Knee/Hip Exercises: Aerobic   Stationary Bike nustep seat 16 UE's 14 10 minutes level #3 hills #3   Knee/Hip Exercises: Standing   Heel Raises 15 reps   Knee Flexion Both;15 reps   Forward Lunges Both;10 reps   Forward Lunges Limitations onto 4" box   Lateral Step Up Both;10 reps;Step Height: 4";Hand Hold: 1   Forward Step Up Both;10 reps;Step Height: 4";Hand Hold: 1   Functional Squat 15 reps   Knee/Hip Exercises: Seated    Other Seated Knee Exercises sit to stand x 10   Manual Therapy   Manual Therapy Manual Lymphatic Drainage (MLD)   Manual Lymphatic Drainage (MLD) Bil LE including supraclavicular, deep and superficial abdominal, routing fluid using  Lt inguinal-axillary anastomosis                   PT Short Term Goals - 05/03/15 1651    PT SHORT TERM GOAL #1   Title I HEP    Time 2   Period Weeks   PT SHORT TERM GOAL #2   Title Pt to be able to  SLS x 5 seconds for reduce risk of falling    Time 2   Period Weeks   PT SHORT TERM GOAL #3   Title Pt fluid volume to be decreased by 30%   Time 2   Period Weeks   PT SHORT TERM GOAL #4   Title ROM to be increased to 120 to allow pt to squat to the floor to pick up items   Time 2   Period Weeks           PT Long Term Goals - 05/03/15 1652    PT LONG TERM GOAL #1   Title Pt SLS to be 10 seconds for reduced risk of falling    Time 4   Period Weeks   PT LONG TERM GOAL #2   Title Pt to be confident to ambulate without an assistive device   Time 4   Period Weeks   PT LONG TERM GOAL #3   Title Volume to be decreased by 50 %   Time 4   Period Weeks               Plan - 05/31/15 1039    Clinical Impression Statement progressed exercises with lunges and step ups. Pt able to complete with therapist facilitation for form and increased depth with therex. manual lymph drainage completed with donning of compression garments at end of session. Encouraged patient once again to locate his sock butler or acquire a new one so he can become independent with donning garment. Pt completed 20 minutes on nustep at [facility] (no charge).    PT Next Visit Plan Progress therex to increase LE strength and stability with last 30 minutes dedicated to manual lymph drainage.  Re-evaluate/remeasure X 2 more visits.        Problem List Patient Active Problem List   Diagnosis Date Noted  . Edema 03/11/2015  . Diastolic CHF, chronic 24/23/5361  . S/P left  TKA 03/07/2015  .  Muscle weakness (generalized) 01/13/2014  . Knee stiffness 01/13/2014  . Gout 12/11/2013  . Cellulitis 11/19/2013  . Expected blood loss anemia 11/17/2013  . Obese 11/17/2013  . S/P right TKA 11/15/2013  . Anemia, normocytic normochromic 01/12/2013  . Diabetes mellitus 12/10/2012  . 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 Overweight(278.02) 11/17/2012  . Obstructive sleep apnea 11/15/2012  . Chronic kidney disease, stage 3, cardiorenal syndrome 08/23/2011  . Hypertension     Teena Irani, PTA/CLT [phone]  05/31/2015, 10:47 AM  [place], [place], [address] Phone: [phone]   Fax:  [phone]

## 2015-06-02 ENCOUNTER — Encounter (HOSPITAL_COMMUNITY): Payer: Self-pay | Admitting: Physical Therapy

## 2015-06-05 ENCOUNTER — Ambulatory Visit (HOSPITAL_COMMUNITY): Payer: Medicare Other | Admitting: Physical Therapy

## 2015-06-05 DIAGNOSIS — M25662 Stiffness of left knee, not elsewhere classified: Secondary | ICD-10-CM | POA: Diagnosis not present

## 2015-06-05 DIAGNOSIS — R609 Edema, unspecified: Secondary | ICD-10-CM | POA: Diagnosis not present

## 2015-06-05 DIAGNOSIS — M6281 Muscle weakness (generalized): Secondary | ICD-10-CM

## 2015-06-05 NOTE — Therapy (Signed)
[place], [place], [address] Phone: [phone]   Fax:  [phone]  Physical Therapy Treatment  Patient Details  Name: [name] MRN: [id number] Date of Birth: 1946/04/12 Referring Provider:  Cory Munch, PA-C  Encounter Date: 06/05/2015      PT End of Session - 06/05/15 1618    Visit Number 9   Number of Visits 12   Date for PT Re-Evaluation 06/02/15   Authorization Type [employer] medicare   Authorization - Visit Number 9   Authorization - Number of Visits 10   PT Start Time 6168   PT Stop Time 1613   PT Time Calculation (min) 43 min   Activity Tolerance Patient tolerated treatment well      Past Medical History  Diagnosis Date  . Diastolic heart failure     Pedal edema, "a long time ago"  . Hypertension     Exercise induced  . NSVT (nonsustained ventricular tachycardia)     Exercise induced  . 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 Obesity   . Degenerative joint disease   . Benign prostatic hypertrophy     Nocturia  . Erectile dysfunction   . High cholesterol   . CAD (coronary artery disease)     a.  NSTEMI 10/2012 s/p DES to LAD & DES to 1st diagonal with residual diffuse nonobstructive dz in LCx/RCA   . Chronic kidney disease, stage 3, mod decreased GFR     Creatinine of 1.5 in 03/2009  . Diabetes mellitus, type II 12/10/2012.  Marland Kitchen Anxiety   . Depression     hx of  . Flu 2013    hx of  . Hemorrhoids   . Tubular adenoma 2014  . GERD (gastroesophageal reflux disease)     Past Surgical History  Procedure Laterality Date  . Cardiac catheterization      with stent placement  . Back surgery  2007    x2  . Hernia repair    . Total knee arthroplasty Right 11/15/2013    Procedure: RIGHT TOTAL KNEE ARTHROPLASTY;  Surgeon: Mauri Pole, MD;  Location: [facility];  Service: Orthopedics;  Laterality: Right;  . Colonoscopy with esophagogastroduodenoscopy (egd) N/A  11/23/2013    Dr. Oneida Alar- TCS= left sided colitis and mild proctitis, moderate internal hemorrhoids, small nodule in the rectum, bx=tubular adenoma, EGD=-mild non-erosive gastritis  . Left heart catheterization with coronary angiogram N/A 11/16/2012    Procedure: LEFT HEART CATHETERIZATION WITH CORONARY ANGIOGRAM;  Surgeon: Sherren Mocha, MD;  Location: [facility] CATH LAB;  Service: Cardiovascular;  Laterality: N/A;  . Percutaneous coronary stent intervention (pci-s)  11/16/2012    Procedure: PERCUTANEOUS CORONARY STENT INTERVENTION (PCI-S);  Surgeon: Sherren Mocha, MD;  Location: [facility] CATH LAB;  Service: Cardiovascular;;  . Total knee arthroplasty Left 03/07/2015    Procedure: LEFT TOTAL KNEE ARTHROPLASTY;  Surgeon: Paralee Cancel, MD;  Location: [facility];  Service: Orthopedics;  Laterality: Left;    There were no vitals filed for this visit.  Visit Diagnosis:  Knee stiffness, left  Muscle weakness (generalized)  Edema      Subjective Assessment - 06/05/15 1613    Subjective Pt states that he just got out of the pool at the [facility] states that she he spent an hour and a half in the pool.    Currently in Pain? No/denies  [facility] Adult PT Treatment/Exercise - 06/05/15 1614    Knee/Hip Exercises: Stretches   Quad Stretch 2 reps;60 seconds   Quad Stretch Limitations sidelying   Knee/Hip Exercises: Supine   Heel Slides Left;10 reps   Manual Therapy   Manual Therapy Manual Lymphatic Drainage (MLD)   Manual Lymphatic Drainage (MLD) Lt LE including supraclavicular, deep and superficial abdominal mm ; routing fluid using inguinal/axillary anastomosis both anteriorly and posteriorly.  Posteriior done sidelying.               PT Short Term Goals - 05/03/15 1651    PT SHORT TERM GOAL #1   Title I HEP    Time 2   Period Weeks   PT SHORT TERM GOAL #2   Title Pt to be able to  SLS x 5 seconds for reduce risk of falling    Time 2   Period Weeks   PT SHORT TERM GOAL #3   Title  Pt fluid volume to be decreased by 30%   Time 2   Period Weeks   PT SHORT TERM GOAL #4   Title ROM to be increased to 120 to allow pt to squat to the floor to pick up items   Time 2   Period Weeks           PT Long Term Goals - 05/03/15 1652    PT LONG TERM GOAL #1   Title Pt SLS to be 10 seconds for reduced risk of falling    Time 4   Period Weeks   PT LONG TERM GOAL #2   Title Pt to be confident to ambulate without an assistive device   Time 4   Period Weeks   PT LONG TERM GOAL #3   Title Volume to be decreased by 50 %   Time 4   Period Weeks               Plan - 06/05/15 1618    Clinical Impression Statement Pt 15 minutes late for appointment today.  Pt treatment focused on manual lymphh drainage for Lt LE.  Pt has increased induration noted below knee level. Pt did not bring compresion garment in to don after treatment.  Reminded pt to bring compression stocking to treatment.    PT Next Visit Plan Reassess next visit.         Problem List Patient Active Problem List   Diagnosis Date Noted  . Edema 03/11/2015  . Diastolic CHF, chronic 76/16/0737  . 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 Muscle weakness (generalized) 01/13/2014  . Knee stiffness 01/13/2014  . Gout 12/11/2013  . Cellulitis 11/19/2013  . Expected blood loss anemia 11/17/2013  . Obese 11/17/2013  . S/P right TKA 11/15/2013  . Anemia, normocytic normochromic 01/12/2013  . Diabetes mellitus 12/10/2012  . 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 Overweight(278.02) 11/17/2012  . Obstructive sleep apnea 11/15/2012  . Chronic kidney disease, stage 3, cardiorenal syndrome 08/23/2011  . Hypertension    Rayetta Humphrey, [place] CLT [phone] 06/05/2015, 4:22 PM  [place] [address] [place], [place], [address] Phone: [phone]   Fax:  [phone]

## 2015-06-07 ENCOUNTER — Ambulatory Visit (HOSPITAL_COMMUNITY): Payer: Medicare Other | Admitting: Physical Therapy

## 2015-06-07 DIAGNOSIS — M25662 Stiffness of left knee, not elsewhere classified: Secondary | ICD-10-CM

## 2015-06-07 DIAGNOSIS — M6281 Muscle weakness (generalized): Secondary | ICD-10-CM | POA: Diagnosis not present

## 2015-06-07 DIAGNOSIS — R609 Edema, unspecified: Secondary | ICD-10-CM

## 2015-06-07 NOTE — Therapy (Signed)
[place], [place], [address] Phone: [phone]   Fax:  [phone]  Physical Therapy Treatment  Patient Details  Name: [name] MRN: [id number] Date of Birth: 1946/01/31 Referring Provider:  Cory Munch, PA-C  Encounter Date: 06/07/2015      PT End of Session - 06/07/15 1642    Visit Number 10   Number of Visits 12   Date for PT Re-Evaluation 06/02/15   Authorization Type [employer] medicare   Authorization - Visit Number 10   Authorization - Number of Visits 20   PT Start Time 7253   PT Stop Time 1615   PT Time Calculation (min) 45 min      Past Medical History  Diagnosis Date  . Diastolic heart failure     Pedal edema, "a long time ago"  . Hypertension     Exercise induced  . NSVT (nonsustained ventricular tachycardia)     Exercise induced  . 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 Obesity   . Degenerative joint disease   . Benign prostatic hypertrophy     Nocturia  . Erectile dysfunction   . High cholesterol   . CAD (coronary artery disease)     a.  NSTEMI 10/2012 s/p DES to LAD & DES to 1st diagonal with residual diffuse nonobstructive dz in LCx/RCA   . Chronic kidney disease, stage 3, mod decreased GFR     Creatinine of 1.5 in 03/2009  . Diabetes mellitus, type II 12/10/2012.  Marland Kitchen Anxiety   . Depression     hx of  . Flu 2013    hx of  . Hemorrhoids   . Tubular adenoma 2014  . GERD (gastroesophageal reflux disease)     Past Surgical History  Procedure Laterality Date  . Cardiac catheterization      with stent placement  . Back surgery  2007    x2  . Hernia repair    . Total knee arthroplasty Right 11/15/2013    Procedure: RIGHT TOTAL KNEE ARTHROPLASTY;  Surgeon: Mauri Pole, MD;  Location: [facility];  Service: Orthopedics;  Laterality: Right;  . Colonoscopy with esophagogastroduodenoscopy (egd) N/A 11/23/2013    Dr. Oneida Alar- TCS= left sided colitis and mild  proctitis, moderate internal hemorrhoids, small nodule in the rectum, bx=tubular adenoma, EGD=-mild non-erosive gastritis  . Left heart catheterization with coronary angiogram N/A 11/16/2012    Procedure: LEFT HEART CATHETERIZATION WITH CORONARY ANGIOGRAM;  Surgeon: Sherren Mocha, MD;  Location: [facility] CATH LAB;  Service: Cardiovascular;  Laterality: N/A;  . Percutaneous coronary stent intervention (pci-s)  11/16/2012    Procedure: PERCUTANEOUS CORONARY STENT INTERVENTION (PCI-S);  Surgeon: Sherren Mocha, MD;  Location: [facility] CATH LAB;  Service: Cardiovascular;;  . Total knee arthroplasty Left 03/07/2015    Procedure: LEFT TOTAL KNEE ARTHROPLASTY;  Surgeon: Paralee Cancel, MD;  Location: [facility];  Service: Orthopedics;  Laterality: Left;    There were no vitals filed for this visit.  Visit Diagnosis:  Knee stiffness, left  Edema      Subjective Assessment - 06/07/15 1641    Subjective Pt states his legs still feel very heavy.  States that he urinates more the day after he comes to therapy   Currently in Pain? No/denies                         [facility] Adult PT Treatment/Exercise - 06/07/15  0001    Knee/Hip Exercises: Stretches   Sports administrator 2 reps;60 seconds   Quad Stretch Limitations sidelying   Knee/Hip Exercises: Supine   Heel Slides Left;10 reps   Manual Therapy   Manual Therapy Manual Lymphatic Drainage (MLD)   Manual Lymphatic Drainage (MLD) Lt LE including supraclavicular, deep and superficial abdominal mm ; routing fluid using inguinal/axillary anastomosis both anteriorly and posteriorly.  Posteriior done sidelying.                   PT Short Term Goals - 05/03/15 1651    PT SHORT TERM GOAL #1   Title I HEP    Time 2   Period Weeks   PT SHORT TERM GOAL #2   Title Pt to be able to  SLS x 5 seconds for reduce risk of falling    Time 2   Period Weeks   PT SHORT TERM GOAL #3   Title Pt fluid volume to be decreased by 30%   Time 2   Period Weeks   PT  SHORT TERM GOAL #4   Title ROM to be increased to 120 to allow pt to squat to the floor to pick up items   Time 2   Period Weeks           PT Long Term Goals - 05/03/15 1652    PT LONG TERM GOAL #1   Title Pt SLS to be 10 seconds for reduced risk of falling    Time 4   Period Weeks   PT LONG TERM GOAL #2   Title Pt to be confident to ambulate without an assistive device   Time 4   Period Weeks   PT LONG TERM GOAL #3   Title Volume to be decreased by 50 %   Time 4   Period Weeks               Plan - 06-26-15 1642    Clinical Impression Statement Pt comes to appointment with compression garments donned  Pt continues to have increased induration which is slowly decreasing in Lt LE.     PT Next Visit Plan Pt to be remeasured next treatment.  Discuss with pt if he would consider compression wrapping to decrease fluid volume          G-Codes - 2015-06-26 1645    Functional Assessment Tool Used clinical judgement/pt report of function   Functional Limitation Mobility: Walking and moving around   Mobility: Walking and Moving Around Current Status ([id number]) At least 20 percent but less than 40 percent impaired, limited or restricted   Mobility: Walking and Moving Around Goal Status [phone]) At least 20 percent but less than 40 percent impaired, limited or restricted      Problem List Patient Active Problem List   Diagnosis Date Noted  . Edema 03/11/2015  . Diastolic CHF, chronic 18/56/3149  . 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 Muscle weakness (generalized) 01/13/2014  . Knee stiffness 01/13/2014  . Gout 12/11/2013  . Cellulitis 11/19/2013  . Expected blood loss anemia 11/17/2013  . Obese 11/17/2013  . S/P right TKA 11/15/2013  . Anemia, normocytic normochromic 01/12/2013  . Diabetes mellitus 12/10/2012  . 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 Overweight(278.02) 11/17/2012  . Obstructive sleep apnea 11/15/2012  . Chronic kidney disease, stage 3,  cardiorenal syndrome 08/23/2011  . Hypertension    Rayetta Humphrey, [place] CLT [phone] 06/26/15, 4:46 PM  [facility]  [address] [place], [place], [address] Phone: [phone]   Fax:  [phone]

## 2015-06-09 ENCOUNTER — Ambulatory Visit (HOSPITAL_COMMUNITY): Payer: Medicare Other | Admitting: Physical Therapy

## 2015-06-15 ENCOUNTER — Ambulatory Visit (HOSPITAL_COMMUNITY): Payer: Medicare Other | Admitting: Physical Therapy

## 2015-06-15 DIAGNOSIS — M25662 Stiffness of left knee, not elsewhere classified: Secondary | ICD-10-CM

## 2015-06-15 DIAGNOSIS — R609 Edema, unspecified: Secondary | ICD-10-CM

## 2015-06-15 DIAGNOSIS — M6281 Muscle weakness (generalized): Secondary | ICD-10-CM | POA: Diagnosis not present

## 2015-06-15 NOTE — Therapy (Signed)
[place], [place], [address] Phone: [phone]   Fax:  [phone]  Physical Therapy Treatment  Patient Details  Name: [name] MRN: [id number] Date of Birth: 10/23/1946 Referring Provider:  Cory Munch, PA-C  Encounter Date: 06/15/2015      PT End of Session - 06/16/15 0747    Visit Number 11   Number of Visits 12   Date for PT Re-Evaluation 06/02/15   Authorization Type [employer] medicare   Authorization - Visit Number 11   Authorization - Number of Visits 20   PT Start Time 1100   PT Stop Time 1200   PT Time Calculation (min) 60 min   Activity Tolerance Patient tolerated treatment well      Past Medical History  Diagnosis Date  . Diastolic heart failure     Pedal edema, "a long time ago"  . Hypertension     Exercise induced  . NSVT (nonsustained ventricular tachycardia)     Exercise induced  . 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 Obesity   . Degenerative joint disease   . Benign prostatic hypertrophy     Nocturia  . Erectile dysfunction   . High cholesterol   . CAD (coronary artery disease)     a.  NSTEMI 10/2012 s/p DES to LAD & DES to 1st diagonal with residual diffuse nonobstructive dz in LCx/RCA   . Chronic kidney disease, stage 3, mod decreased GFR     Creatinine of 1.5 in 03/2009  . Diabetes mellitus, type II 12/10/2012.  Marland Kitchen Anxiety   . Depression     hx of  . Flu 2013    hx of  . Hemorrhoids   . Tubular adenoma 2014  . GERD (gastroesophageal reflux disease)     Past Surgical History  Procedure Laterality Date  . Cardiac catheterization      with stent placement  . Back surgery  2007    x2  . Hernia repair    . Total knee arthroplasty Right 11/15/2013    Procedure: RIGHT TOTAL KNEE ARTHROPLASTY;  Surgeon: Mauri Pole, MD;  Location: [facility];  Service: Orthopedics;  Laterality: Right;  . Colonoscopy with esophagogastroduodenoscopy (egd) N/A  11/23/2013    Dr. Oneida Alar- TCS= left sided colitis and mild proctitis, moderate internal hemorrhoids, small nodule in the rectum, bx=tubular adenoma, EGD=-mild non-erosive gastritis  . Left heart catheterization with coronary angiogram N/A 11/16/2012    Procedure: LEFT HEART CATHETERIZATION WITH CORONARY ANGIOGRAM;  Surgeon: Sherren Mocha, MD;  Location: [facility] CATH LAB;  Service: Cardiovascular;  Laterality: N/A;  . Percutaneous coronary stent intervention (pci-s)  11/16/2012    Procedure: PERCUTANEOUS CORONARY STENT INTERVENTION (PCI-S);  Surgeon: Sherren Mocha, MD;  Location: [facility] CATH LAB;  Service: Cardiovascular;;  . Total knee arthroplasty Left 03/07/2015    Procedure: LEFT TOTAL KNEE ARTHROPLASTY;  Surgeon: Paralee Cancel, MD;  Location: [facility];  Service: Orthopedics;  Laterality: Left;    There were no vitals filed for this visit.  Visit Diagnosis:  Knee stiffness, left  Edema  Muscle weakness (generalized)      Subjective Assessment - 06/15/15 1121    Subjective Pt states that he is tired of alll the swelling             [facility] PT Assessment - 06/15/15 0001    AROM   Left Knee Extension 5   Left Knee Flexion 110  Strength   Right Hip Flexion 5/5   Right Hip Extension 4/5  was 3-   Right Hip ABduction 5/5  was 4/5   Right Knee Flexion 4+/5  was 4/5   Right Knee Extension 5/5         Date 05/03/2015 05/03/2015 06/15/2015 06/14/2012   Left Right LT RT  MTP 39.30 39.00 30.8 31.2  ankle 36.3 35 33.50 33.80  4cm 33.30 32.00 31.80 33.20  8cm 34.00 34.00 35.00 33.00  12 cm 36.80 36.20 37.50 36.30  16cm 40.00 39.40 39.50 39.00  20cm 42.00 41.00 42.10 40.90  24cm 44.70 43.60 45.00 43.40  28cm 46.00 44.50 46.40 45.80  32cm 44.00 42.70 48.00 46.10  36cm 49.00 46.50 48.80 47.10  40cm 51.00 49.00 48.00 47.50  44cm 52.00 49.80 51.00 55.50  48cm 60.80 54.50 56.00 58.70  52cm 64.00 57.00 59.00 57.00  56cm 65.50 58.00 60.30 59.00  60cm      64cm            Sum of squares  35684.29 31800.84 33067.53 32655.63  Total Volume 11358.67 10122.525 [id number]    Pt received manual lymphedma techniques including supraclavicular; deep and superfical abdominal and routing fluid using the inguinal-axillary anastomosis.            PT Education - 06/16/15 0746    Education provided Yes   Education Details Explained that pt would get better results if he bought short stretch bandages and allowed [place] to compression wrap using short stretch bandages and foam after the manual.    Person(s) Educated Patient   Methods Explanation   Comprehension Verbalized understanding          PT Short Term Goals - 06/16/15 0753    PT SHORT TERM GOAL #1   Title I HEP    Time 2   Period Weeks   Status Achieved   PT SHORT TERM GOAL #2   Title Pt to be able to  SLS x 5 seconds for reduce risk of falling    Time 2   Period Weeks   Status Achieved   PT SHORT TERM GOAL #3   Title Pt fluid volume to be decreased by 30%   Time 2   Period Weeks   Status On-going   PT SHORT TERM GOAL #4   Title ROM to be increased to 120 to allow pt to squat to the floor to pick up items   Time 2   Period Weeks   Status On-going           PT Long Term Goals - 06/16/15 0753    PT LONG TERM GOAL #1   Title Pt SLS to be 10 seconds for reduced risk of falling    Time 4   Period Weeks   Status On-going   PT LONG TERM GOAL #2   Title Pt to be confident to ambulate without an assistive device   Time 4   Period Weeks   Status Achieved   PT LONG TERM GOAL #3   Title Volume to be decreased by 50 %   Time 4   Period Weeks   Status Achieved               Plan - 06/16/15 0748    Clinical Impression Statement Pt comes to department with compression garments to be donned after treatment which therapist did.  Pt was not fitted for these and they are not the correct size which pt was advised.  Pt continues to have increased induration in B LE which is affedting his ability to  bend his knee.  Pt has lost 925cc in his LT LE which is the extremity we have been treating.  Pt will benefit from continued  manual lymph techniques only if he is willing to be compressioned wrapped.  Pt was given information as to what bandages he needs to buy and where to purchase them.     Pt will benefit from skilled therapeutic intervention in order to improve on the following deficits Decreased balance;Decreased strength;Decreased range of motion;Increased edema   PT Frequency 3x / week   PT Duration 8 weeks  an additional 4 weeks    PT Treatment/Interventions Manual techniques;Compression bandaging   PT Next Visit Plan Pt to begin compression wrapping as soon as possible   PT Home Exercise Plan Pt going to the [facility]   Consulted and Agree with Plan of Care Patient        Problem List Patient Active Problem List   Diagnosis Date Noted  . Edema 03/11/2015  . Diastolic CHF, chronic 12/13/4113  . 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 Muscle weakness (generalized) 01/13/2014  . Knee stiffness 01/13/2014  . Gout 12/11/2013  . Cellulitis 11/19/2013  . Expected blood loss anemia 11/17/2013  . Obese 11/17/2013  . S/P right TKA 11/15/2013  . Anemia, normocytic normochromic 01/12/2013  . Diabetes mellitus 12/10/2012  . 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 Overweight(278.02) 11/17/2012  . Obstructive sleep apnea 11/15/2012  . Chronic kidney disease, stage 3, cardiorenal syndrome 08/23/2011  . Hypertension    Rayetta Humphrey, [place] CLT [phone] 06/16/2015, 7:59 AM  [facility], [place], [address] Phone: [phone]   Fax:  [phone]

## 2015-06-20 ENCOUNTER — Ambulatory Visit (HOSPITAL_COMMUNITY): Payer: Medicare Other | Admitting: Physical Therapy

## 2015-06-20 DIAGNOSIS — M25662 Stiffness of left knee, not elsewhere classified: Secondary | ICD-10-CM | POA: Diagnosis not present

## 2015-06-20 DIAGNOSIS — E6609 Other obesity due to excess calories: Secondary | ICD-10-CM | POA: Diagnosis not present

## 2015-06-20 DIAGNOSIS — Z1389 Encounter for screening for other disorder: Secondary | ICD-10-CM | POA: Diagnosis not present

## 2015-06-20 DIAGNOSIS — R609 Edema, unspecified: Secondary | ICD-10-CM

## 2015-06-20 DIAGNOSIS — Z6836 Body mass index (BMI) 36.0-36.9, adult: Secondary | ICD-10-CM | POA: Diagnosis not present

## 2015-06-20 DIAGNOSIS — M1991 Primary osteoarthritis, unspecified site: Secondary | ICD-10-CM | POA: Diagnosis not present

## 2015-06-20 DIAGNOSIS — E119 Type 2 diabetes mellitus without complications: Secondary | ICD-10-CM | POA: Diagnosis not present

## 2015-06-20 DIAGNOSIS — M6281 Muscle weakness (generalized): Secondary | ICD-10-CM | POA: Diagnosis not present

## 2015-06-20 DIAGNOSIS — R001 Bradycardia, unspecified: Secondary | ICD-10-CM | POA: Diagnosis not present

## 2015-06-20 NOTE — Therapy (Addendum)
[place], [place], [address] Phone: [phone]   Fax:  [phone]  Physical Therapy Treatment  Patient Details  Name: [name] MRN: [id number] Date of Birth: Mar 07, 1946 Referring Provider:  Cory Munch, PA-C  Encounter Date: 06/20/2015      PT End of Session - 06/20/15 1225    Visit Number 12   Number of Visits 21   Date for PT Re-Evaluation 07/11/15   Authorization Type [employer] medicare   Authorization - Visit Number 12   Authorization - Number of Visits 20   PT Start Time 1020   PT Stop Time 1110   PT Time Calculation (min) 50 min      Past Medical History  Diagnosis Date  . Diastolic heart failure     Pedal edema, "a long time ago"  . Hypertension     Exercise induced  . NSVT (nonsustained ventricular tachycardia)     Exercise induced  . 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 Obesity   . Degenerative joint disease   . Benign prostatic hypertrophy     Nocturia  . Erectile dysfunction   . High cholesterol   . CAD (coronary artery disease)     a.  NSTEMI 10/2012 s/p DES to LAD & DES to 1st diagonal with residual diffuse nonobstructive dz in LCx/RCA   . Chronic kidney disease, stage 3, mod decreased GFR     Creatinine of 1.5 in 03/2009  . Diabetes mellitus, type II 12/10/2012.  Marland Kitchen Anxiety   . Depression     hx of  . Flu 2013    hx of  . Hemorrhoids   . Tubular adenoma 2014  . GERD (gastroesophageal reflux disease)     Past Surgical History  Procedure Laterality Date  . Cardiac catheterization      with stent placement  . Back surgery  2007    x2  . Hernia repair    . Total knee arthroplasty Right 11/15/2013    Procedure: RIGHT TOTAL KNEE ARTHROPLASTY;  Surgeon: Mauri Pole, MD;  Location: [facility];  Service: Orthopedics;  Laterality: Right;  . Colonoscopy with esophagogastroduodenoscopy (egd) N/A 11/23/2013    Dr. Oneida Alar- TCS= left sided colitis and mild  proctitis, moderate internal hemorrhoids, small nodule in the rectum, bx=tubular adenoma, EGD=-mild non-erosive gastritis  . Left heart catheterization with coronary angiogram N/A 11/16/2012    Procedure: LEFT HEART CATHETERIZATION WITH CORONARY ANGIOGRAM;  Surgeon: Sherren Mocha, MD;  Location: [facility] CATH LAB;  Service: Cardiovascular;  Laterality: N/A;  . Percutaneous coronary stent intervention (pci-s)  11/16/2012    Procedure: PERCUTANEOUS CORONARY STENT INTERVENTION (PCI-S);  Surgeon: Sherren Mocha, MD;  Location: [facility] CATH LAB;  Service: Cardiovascular;;  . Total knee arthroplasty Left 03/07/2015    Procedure: LEFT TOTAL KNEE ARTHROPLASTY;  Surgeon: Paralee Cancel, MD;  Location: [facility];  Service: Orthopedics;  Laterality: Left;    There were no vitals filed for this visit.  Visit Diagnosis:  Knee stiffness, left  Edema      Subjective Assessment - 06/20/15 1049    Subjective Pt states that his MD stated all he needed to do is to double up on his fluid pills and wear his compression stockings pt did not buy his short stretch bandages secondary to this.  Pt wants his legs measured as he feels they have gone down since he has been taking the fluid pills  Date 05/03/2015 05/03/2015 06/15/2015 06/14/2012 06/20/2015 06/20/2015   Left Right LT RT LT RT   MTP 39.30 39.00 30.8 31.2 29.7 30.7  ankle 36.3 35 33.50 33.80 32.70 33.20  4cm 33.30 32.00 31.80 33.20 31.90 31.20  8cm 34.00 34.00 35.00 33.00 34.70 33.80  12 cm 36.80 36.20 37.50 36.30 37.30 36.70  16cm 40.00 39.40 39.50 39.00 39.80 39.00  20cm 42.00 41.00 42.10 40.90 41.80 41.60  24cm 44.70 43.60 45.00 43.40 44.70 45.20  28cm 46.00 44.50 46.40 45.80 45.60 46.80  32cm 44.00 42.70 48.00 46.10 47.10 47.80  36cm 49.00 46.50 48.80 47.10 47.80 47.00  40cm 51.00 49.00 48.00 47.50 47.20 46.70  44cm 52.00 49.80 51.00 55.50 51.20 49.30  48cm 60.80 54.50 56.00 58.70 54.90 53.20  52cm 64.00 57.00 59.00 57.00 58.80 57.20  56cm 65.50 58.00  60.30 59.00 60.90 59.40  60cm        64cm                Sum of squares 35684.29 31800.84 33067.53 32655.63 32505.89 31728.00  Total Volume 11358.67 10122.525 10525.[place].614 10346.95 10099.34             [facility] Adult PT Treatment/Exercise - 06/20/15 0001    Manual Therapy   Manual Therapy Manual Lymphatic Drainage (MLD)   Manual Lymphatic Drainage (MLD) Lt LE including supraclavicular, deep and superficial abdominal mm ; routing fluid using inguinal/axillary anastomosis both anteriorly and posteriorly.; Rt LE done anteriorly only.                  PT Short Term Goals - 06/16/15 0753    PT SHORT TERM GOAL #1   Title I HEP    Time 2   Period Weeks   Status Achieved   PT SHORT TERM GOAL #2   Title Pt to be able to  SLS x 5 seconds for reduce risk of falling    Time 2   Period Weeks   Status Achieved   PT SHORT TERM GOAL #3   Title Pt fluid volume to be decreased by 30%   Time 2   Period Weeks   Status On-going   PT SHORT TERM GOAL #4   Title ROM to be increased to 120 to allow pt to squat to the floor to pick up items   Time 2   Period Weeks   Status On-going           PT Long Term Goals - 06/16/15 0753    PT LONG TERM GOAL #1   Title Pt SLS to be 10 seconds for reduced risk of falling    Time 4   Period Weeks   Status On-going   PT LONG TERM GOAL #2   Title Pt to be confident to ambulate without an assistive device   Time 4   Period Weeks   Status Achieved   PT LONG TERM GOAL #3   Title Volume to be decreased by 50 %   Time 4   Period Weeks   Status Achieved               Plan - 06/20/15 1226    Clinical Impression Statement Pt comes without short stretch bandages stating that his MD stated he only needed to increase his fluid pills and wear his stockings.  Therapist was not able to convince pt that compression wrapping would be beneficial.  Therapist explained that is he was not going to continue with compression wrapping there  was  no need to continue with skilled services as the manual on it's own is not enough to keep the swelling down in his legs.  At this point pt stated that he wanted to think about it.  Pt swelling has decreased some ,(178cc on LT ; 252 cc on Rt),  since last visit.    PT Next Visit Plan D/C patient if he does not agree to compression wrapping with short stretch.       [place] CJ   Problem List Patient Active Problem List   Diagnosis Date Noted  . Edema 03/11/2015  . Diastolic CHF, chronic 96/22/2979  . 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 Muscle weakness (generalized) 01/13/2014  . Knee stiffness 01/13/2014  . Gout 12/11/2013  . Cellulitis 11/19/2013  . Expected blood loss anemia 11/17/2013  . Obese 11/17/2013  . S/P right TKA 11/15/2013  . Anemia, normocytic normochromic 01/12/2013  . Diabetes mellitus 12/10/2012  . 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 Overweight(278.02) 11/17/2012  . Obstructive sleep apnea 11/15/2012  . Chronic kidney disease, stage 3, cardiorenal syndrome 08/23/2011  . Hypertension   Rayetta Humphrey, [place] CLT [phone] 06/20/2015, 12:32 PM  [place] [address] [place], [place], [address] Phone: [phone]   Fax:  [phone]  PHYSICAL THERAPY DISCHARGE SUMMARY  Visits from Start of Care: 6  Current functional level related to goals / functional outcomes: No significant decrease in volume   Remaining deficits: edema   Education / Equipment: The need to use compression stockings;   Plan: Patient agrees to discharge.  Patient goals were partially met. Patient is being discharged due to not returning since the last visit.  ?????       Rayetta Humphrey, [place] CLT [phone]

## 2015-06-30 DIAGNOSIS — I5032 Chronic diastolic (congestive) heart failure: Secondary | ICD-10-CM | POA: Diagnosis not present

## 2015-06-30 DIAGNOSIS — Z1389 Encounter for screening for other disorder: Secondary | ICD-10-CM | POA: Diagnosis not present

## 2015-06-30 DIAGNOSIS — Z6838 Body mass index (BMI) 38.0-38.9, adult: Secondary | ICD-10-CM | POA: Diagnosis not present

## 2015-07-04 ENCOUNTER — Ambulatory Visit (INDEPENDENT_AMBULATORY_CARE_PROVIDER_SITE_OTHER): Payer: Medicare Other | Admitting: Cardiovascular Disease

## 2015-07-04 ENCOUNTER — Encounter (HOSPITAL_COMMUNITY): Payer: Self-pay

## 2015-07-04 ENCOUNTER — Inpatient Hospital Stay (HOSPITAL_COMMUNITY)
Admission: AD | Admit: 2015-07-04 | Discharge: 2015-07-10 | DRG: 291 | Disposition: A | Discharge status: Home Health | Payer: Medicare Other | Source: Ambulatory Visit | Attending: Internal Medicine | Admitting: Internal Medicine

## 2015-07-04 ENCOUNTER — Inpatient Hospital Stay (HOSPITAL_COMMUNITY): Payer: Medicare Other

## 2015-07-04 VITALS — BP 140/82 | HR 63 | Ht 75.0 in | Wt 311.8 lb

## 2015-07-04 DIAGNOSIS — Z87891 Personal history of nicotine dependence: Secondary | ICD-10-CM | POA: Diagnosis not present

## 2015-07-04 DIAGNOSIS — E780 Pure hypercholesterolemia: Secondary | ICD-10-CM | POA: Diagnosis not present

## 2015-07-04 DIAGNOSIS — I517 Cardiomegaly: Secondary | ICD-10-CM | POA: Diagnosis not present

## 2015-07-04 DIAGNOSIS — Z96652 Presence of left artificial knee joint: Secondary | ICD-10-CM | POA: Diagnosis present

## 2015-07-04 DIAGNOSIS — R600 Localized edema: Secondary | ICD-10-CM | POA: Diagnosis present

## 2015-07-04 DIAGNOSIS — I5033 Acute on chronic diastolic (congestive) heart failure: Secondary | ICD-10-CM

## 2015-07-04 DIAGNOSIS — E118 Type 2 diabetes mellitus with unspecified complications: Secondary | ICD-10-CM

## 2015-07-04 DIAGNOSIS — F411 Generalized anxiety disorder: Secondary | ICD-10-CM

## 2015-07-04 DIAGNOSIS — I130 Hypertensive heart and chronic kidney disease with heart failure and stage 1 through stage 4 chronic kidney disease, or unspecified chronic kidney disease: Principal | ICD-10-CM | POA: Diagnosis present

## 2015-07-04 DIAGNOSIS — E785 Hyperlipidemia, unspecified: Secondary | ICD-10-CM

## 2015-07-04 DIAGNOSIS — N521 Erectile dysfunction due to diseases classified elsewhere: Secondary | ICD-10-CM

## 2015-07-04 DIAGNOSIS — K219 Gastro-esophageal reflux disease without esophagitis: Secondary | ICD-10-CM | POA: Diagnosis not present

## 2015-07-04 DIAGNOSIS — I459 Conduction disorder, unspecified: Secondary | ICD-10-CM

## 2015-07-04 DIAGNOSIS — I2510 Atherosclerotic heart disease of native coronary artery without angina pectoris: Secondary | ICD-10-CM | POA: Diagnosis not present

## 2015-07-04 DIAGNOSIS — N183 Chronic kidney disease, stage 3 unspecified: Secondary | ICD-10-CM

## 2015-07-04 DIAGNOSIS — E1169 Type 2 diabetes mellitus with other specified complication: Secondary | ICD-10-CM

## 2015-07-04 DIAGNOSIS — Z9861 Coronary angioplasty status: Secondary | ICD-10-CM | POA: Diagnosis not present

## 2015-07-04 DIAGNOSIS — I10 Essential (primary) hypertension: Secondary | ICD-10-CM | POA: Diagnosis not present

## 2015-07-04 DIAGNOSIS — Z955 Presence of coronary angioplasty implant and graft: Secondary | ICD-10-CM

## 2015-07-04 DIAGNOSIS — Z833 Family history of diabetes mellitus: Secondary | ICD-10-CM

## 2015-07-04 DIAGNOSIS — I509 Heart failure, unspecified: Secondary | ICD-10-CM | POA: Diagnosis not present

## 2015-07-04 DIAGNOSIS — E669 Obesity, unspecified: Secondary | ICD-10-CM

## 2015-07-04 DIAGNOSIS — I252 Old myocardial infarction: Secondary | ICD-10-CM

## 2015-07-04 DIAGNOSIS — Z8249 Family history of ischemic heart disease and other diseases of the circulatory system: Secondary | ICD-10-CM | POA: Diagnosis not present

## 2015-07-04 DIAGNOSIS — I5022 Chronic systolic (congestive) heart failure: Secondary | ICD-10-CM

## 2015-07-04 DIAGNOSIS — G8929 Other chronic pain: Secondary | ICD-10-CM | POA: Diagnosis present

## 2015-07-04 DIAGNOSIS — E119 Type 2 diabetes mellitus without complications: Secondary | ICD-10-CM | POA: Diagnosis not present

## 2015-07-04 DIAGNOSIS — I5020 Unspecified systolic (congestive) heart failure: Secondary | ICD-10-CM

## 2015-07-04 DIAGNOSIS — E782 Mixed hyperlipidemia: Secondary | ICD-10-CM

## 2015-07-04 DIAGNOSIS — J9811 Atelectasis: Secondary | ICD-10-CM | POA: Diagnosis not present

## 2015-07-04 DIAGNOSIS — R609 Edema, unspecified: Secondary | ICD-10-CM | POA: Diagnosis present

## 2015-07-04 DIAGNOSIS — I440 Atrioventricular block, first degree: Secondary | ICD-10-CM

## 2015-07-04 LAB — BASIC METABOLIC PANEL
ANION GAP: 11 (ref 5–15)
BUN: 54 mg/dL — ABNORMAL HIGH (ref 6–20)
CHLORIDE: 103 mmol/L (ref 101–111)
CO2: 26 mmol/L (ref 22–32)
CREATININE: 2.22 mg/dL — AB (ref 0.61–1.24)
Calcium: 8.9 mg/dL (ref 8.9–10.3)
GFR calc Af Amer: 33 mL/min — ABNORMAL LOW (ref >60)
GFR, EST NON AFRICAN AMERICAN: 28 mL/min — AB (ref >60)
Glucose, Bld: 105 mg/dL — ABNORMAL HIGH (ref 65–99)
Potassium: 4.1 mmol/L (ref 3.5–5.1)
SODIUM: 140 mmol/L (ref 135–145)

## 2015-07-04 LAB — CBC
HCT: 36.5 % — ABNORMAL LOW (ref 39.0–52.0)
HEMOGLOBIN: 11.8 g/dL — AB (ref 13.0–17.0)
MCH: 32 pg (ref 26.0–34.0)
MCHC: 32.3 g/dL (ref 30.0–36.0)
MCV: 98.9 fL (ref 78.0–100.0)
PLATELETS: 183 10*3/uL (ref 150–400)
RBC: 3.69 MIL/uL — ABNORMAL LOW (ref 4.22–5.81)
RDW: 16.5 % — AB (ref 11.5–15.5)
WBC: 5.1 10*3/uL (ref 4.0–10.5)

## 2015-07-04 LAB — MAGNESIUM: MAGNESIUM: 1.7 mg/dL (ref 1.7–2.4)

## 2015-07-04 LAB — GLUCOSE, CAPILLARY
GLUCOSE-CAPILLARY: 93 mg/dL (ref 65–99)
Glucose-Capillary: 73 mg/dL (ref 65–99)

## 2015-07-04 MED ORDER — SODIUM CHLORIDE 0.9 % IJ SOLN
3.0000 mL | Freq: Two times a day (BID) | INTRAMUSCULAR | Status: DC
  Administered 2015-07-04 – 2015-07-10 (×12): 3 mL via INTRAVENOUS

## 2015-07-04 MED ORDER — POLYETHYLENE GLYCOL 3350 17 G PO PACK
17.0000 g | PACK | Freq: Two times a day (BID) | ORAL | Status: DC
  Administered 2015-07-04 – 2015-07-10 (×12): 17 g via ORAL
  Filled 2015-07-04 (×12): qty 1

## 2015-07-04 MED ORDER — AMLODIPINE BESYLATE 5 MG PO TABS
10.0000 mg | ORAL_TABLET | Freq: Every morning | ORAL | Status: DC
  Administered 2015-07-05 – 2015-07-10 (×6): 10 mg via ORAL
  Filled 2015-07-04 (×6): qty 2

## 2015-07-04 MED ORDER — DOCUSATE SODIUM 100 MG PO CAPS
100.0000 mg | ORAL_CAPSULE | Freq: Two times a day (BID) | ORAL | Status: DC
  Administered 2015-07-04 – 2015-07-10 (×12): 100 mg via ORAL
  Filled 2015-07-04 (×12): qty 1

## 2015-07-04 MED ORDER — ALLOPURINOL 300 MG PO TABS
300.0000 mg | ORAL_TABLET | Freq: Every morning | ORAL | Status: DC
  Administered 2015-07-05 – 2015-07-10 (×6): 300 mg via ORAL
  Filled 2015-07-04 (×6): qty 1

## 2015-07-04 MED ORDER — ONDANSETRON HCL 4 MG/2ML IJ SOLN: 4.0000 mg | Freq: Four times a day (QID) | INTRAMUSCULAR | Status: DC | PRN

## 2015-07-04 MED ORDER — METOPROLOL TARTRATE 50 MG PO TABS
50.0000 mg | ORAL_TABLET | Freq: Two times a day (BID) | ORAL | Status: DC
  Administered 2015-07-04 – 2015-07-05 (×2): 50 mg via ORAL
  Filled 2015-07-04 (×2): qty 1

## 2015-07-04 MED ORDER — ONDANSETRON HCL 4 MG PO TABS: 4.0000 mg | ORAL_TABLET | Freq: Four times a day (QID) | ORAL | Status: DC | PRN

## 2015-07-04 MED ORDER — HEPARIN SODIUM (PORCINE) 5000 UNIT/ML IJ SOLN
5000.0000 [IU] | Freq: Three times a day (TID) | INTRAMUSCULAR | Status: DC
  Administered 2015-07-04 – 2015-07-10 (×18): 5000 [IU] via SUBCUTANEOUS
  Filled 2015-07-04 (×18): qty 1

## 2015-07-04 MED ORDER — FUROSEMIDE 10 MG/ML IJ SOLN
40.0000 mg | Freq: Two times a day (BID) | INTRAMUSCULAR | Status: DC
  Administered 2015-07-04 – 2015-07-10 (×12): 40 mg via INTRAVENOUS
  Filled 2015-07-04 (×12): qty 4

## 2015-07-04 MED ORDER — FERROUS SULFATE 325 (65 FE) MG PO TABS
325.0000 mg | ORAL_TABLET | Freq: Three times a day (TID) | ORAL | Status: DC
  Administered 2015-07-05 – 2015-07-10 (×17): 325 mg via ORAL
  Filled 2015-07-04 (×17): qty 1

## 2015-07-04 MED ORDER — COLCHICINE 0.6 MG PO TABS
0.6000 mg | ORAL_TABLET | Freq: Every day | ORAL | Status: DC
  Administered 2015-07-05 – 2015-07-10 (×6): 0.6 mg via ORAL
  Filled 2015-07-04 (×6): qty 1

## 2015-07-04 MED ORDER — INSULIN ASPART 100 UNIT/ML ~~LOC~~ SOLN
0.0000 [IU] | Freq: Three times a day (TID) | SUBCUTANEOUS | Status: DC
  Administered 2015-07-05 – 2015-07-08 (×7): 1 [IU] via SUBCUTANEOUS
  Administered 2015-07-09: 3 [IU] via SUBCUTANEOUS
  Administered 2015-07-09 – 2015-07-10 (×2): 2 [IU] via SUBCUTANEOUS
  Administered 2015-07-10: 1 [IU] via SUBCUTANEOUS

## 2015-07-04 MED ORDER — PANTOPRAZOLE SODIUM 40 MG PO TBEC
40.0000 mg | DELAYED_RELEASE_TABLET | Freq: Every day | ORAL | Status: DC
  Administered 2015-07-05 – 2015-07-10 (×6): 40 mg via ORAL
  Filled 2015-07-04 (×6): qty 1

## 2015-07-04 MED ORDER — GABAPENTIN 300 MG PO CAPS
300.0000 mg | ORAL_CAPSULE | Freq: Two times a day (BID) | ORAL | Status: DC
  Administered 2015-07-04 – 2015-07-10 (×12): 300 mg via ORAL
  Filled 2015-07-04 (×12): qty 1

## 2015-07-04 MED ORDER — ALPRAZOLAM 0.5 MG PO TABS
0.5000 mg | ORAL_TABLET | Freq: Two times a day (BID) | ORAL | Status: DC | PRN
  Administered 2015-07-04 – 2015-07-09 (×7): 0.5 mg via ORAL
  Filled 2015-07-04 (×7): qty 1

## 2015-07-04 MED ORDER — OXYCODONE-ACETAMINOPHEN 5-325 MG PO TABS
1.0000 | ORAL_TABLET | ORAL | Status: DC | PRN
  Administered 2015-07-05 – 2015-07-10 (×10): 1 via ORAL
  Filled 2015-07-04 (×10): qty 1

## 2015-07-04 MED ORDER — PRAVASTATIN SODIUM 10 MG PO TABS
20.0000 mg | ORAL_TABLET | Freq: Every morning | ORAL | Status: DC
  Administered 2015-07-05 – 2015-07-10 (×6): 20 mg via ORAL
  Filled 2015-07-04 (×6): qty 2

## 2015-07-04 NOTE — Progress Notes (Signed)
Patient ID: [name], male   DOB: 1946-05-09, 69 y.o.   MRN: [id number]      SUBJECTIVE: [name] presents for routine follow up. He has a history of hypertension and hypertensive heart disease as well as coronary artery disease and sustained a NSTEMI on 11/16/2012 and received a drug-eluting stent to the LAD and D1, with residual diffuse nonobstructive disease in the RCA and LCx. He also has CKD, sleep apnea, obesity, and reportedly also has chronic diastolic heart failure. Most recent echocardiogram in November 2014 demonstrated normal left ventricular systolic function, EF 68-34%, wall motion abnormalities consistent with coronary artery disease and prior MI, and grade 1 diastolic dysfunction.  He developed acute on chronic renal insufficiency on Lasix 40 mg daily and this dose was reduced. However, he began to develop progressive leg swelling extending into his thighs and hips. He does not use salt and does not drink sodas. He weighed 265 pounds on 02/07/15 and now weighs 311 pounds. He has been taking torsemide 20 mg twice daily without effect. We talked about different management strategies but he wants to be hospitalized. He said he is tired of wearing compression stockings. He continues to try and exercise 4 days per week.   Review of Systems: As per "subjective", otherwise negative.  Allergies  Allergen Reactions  . Niacin And Related Other (See Comments)    Reaction: flushing and burning side effects even with extended release    Current Outpatient Prescriptions  Medication Sig Dispense Refill  . acetaminophen (TYLENOL) 325 MG tablet Take 1-2 tablets (325-650 mg total) by mouth every 6 (six) hours as needed. (Patient taking differently: Take 325-650 mg by mouth every 6 (six) hours as needed for moderate pain or headache. )    . allopurinol (ZYLOPRIM) 300 MG tablet Take 300 mg by mouth every morning.     Marland Kitchen ALPRAZolam (XANAX) 0.5 MG tablet Take one tablet by mouth twice daily  as needed for anxiety 60 tablet 5  . amLODipine (NORVASC) 10 MG tablet Take 10 mg by mouth every morning.     Marland Kitchen COLCRYS 0.6 MG tablet Take 0.6 mg by mouth daily.     Marland Kitchen docusate sodium (COLACE) 100 MG capsule Take 1 capsule (100 mg total) by mouth 2 (two) times daily. 10 capsule 0  . ferrous sulfate 325 (65 FE) MG tablet Take 1 tablet (325 mg total) by mouth 3 (three) times daily after meals.  3  . gabapentin (NEURONTIN) 300 MG capsule Take 300 mg by mouth 2 (two) times daily.     . metoprolol (LOPRESSOR) 50 MG tablet Take 50 mg by mouth 2 (two) times daily.    Marland Kitchen NITROSTAT 0.4 MG SL tablet DISSOLVE ONE TABLET UNDER THE TONGUE AS NEEDED FOR CHEST PAIN. 25 tablet 1  . omeprazole (PRILOSEC) 20 MG capsule Take 20 mg by mouth every morning.     Marland Kitchen oxyCODONE (OXY IR/ROXICODONE) 5 MG immediate release tablet Take 1 to 3 Tablets By Mouth Every 4 Hours As Needed For Mild, Moderate, Severe Pain. Take 1 Tablet By Mouth Every 4 Hours As Needed For Mild Pain. Take 2 Tablets (10 MG) By Mouth Every 4 Hours 120 tablet 0  . oxyCODONE-acetaminophen (PERCOCET/ROXICET) 5-325 MG per tablet 1-2 by mouth every 4 hours as needed for moderate severe pain 180 tablet 0  . polyethylene glycol (MIRALAX / GLYCOLAX) packet Take 17 g by mouth 2 (two) times daily. 14 each 0  . pravastatin (PRAVACHOL) 20 MG tablet Take 20  mg by mouth every morning.     . saxagliptin HCl (ONGLYZA) 2.5 MG TABS tablet Take 2.5 mg by mouth every morning.    . rivaroxaban (XARELTO) 10 MG TABS tablet Take 1 tablet (10 mg total) by mouth daily. 14 tablet 0   No current facility-administered medications for this visit.    Past Medical History  Diagnosis Date  . Diastolic heart failure     Pedal edema, "a long time ago"  . Hypertension     Exercise induced  . NSVT (nonsustained ventricular tachycardia)     Exercise induced  . 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 Obesity   . Degenerative joint disease   . Benign prostatic  hypertrophy     Nocturia  . Erectile dysfunction   . High cholesterol   . CAD (coronary artery disease)     a.  NSTEMI 10/2012 s/p DES to LAD & DES to 1st diagonal with residual diffuse nonobstructive dz in LCx/RCA   . Chronic kidney disease, stage 3, mod decreased GFR     Creatinine of 1.5 in 03/2009  . Diabetes mellitus, type II 12/10/2012.  Marland Kitchen Anxiety   . Depression     hx of  . Flu 2013    hx of  . Hemorrhoids   . Tubular adenoma 2014  . GERD (gastroesophageal reflux disease)     Past Surgical History  Procedure Laterality Date  . Cardiac catheterization      with stent placement  . Back surgery  2007    x2  . Hernia repair    . Total knee arthroplasty Right 11/15/2013    Procedure: RIGHT TOTAL KNEE ARTHROPLASTY;  Surgeon: Mauri Pole, MD;  Location: [facility];  Service: Orthopedics;  Laterality: Right;  . Colonoscopy with esophagogastroduodenoscopy (egd) N/A 11/23/2013    Dr. Oneida Alar- TCS= left sided colitis and mild proctitis, moderate internal hemorrhoids, small nodule in the rectum, bx=tubular adenoma, EGD=-mild non-erosive gastritis  . Left heart catheterization with coronary angiogram N/A 11/16/2012    Procedure: LEFT HEART CATHETERIZATION WITH CORONARY ANGIOGRAM;  Surgeon: Sherren Mocha, MD;  Location: [facility] CATH LAB;  Service: Cardiovascular;  Laterality: N/A;  . Percutaneous coronary stent intervention (pci-s)  11/16/2012    Procedure: PERCUTANEOUS CORONARY STENT INTERVENTION (PCI-S);  Surgeon: Sherren Mocha, MD;  Location: [facility] CATH LAB;  Service: Cardiovascular;;  . Total knee arthroplasty Left 03/07/2015    Procedure: LEFT TOTAL KNEE ARTHROPLASTY;  Surgeon: Paralee Cancel, MD;  Location: [facility];  Service: Orthopedics;  Laterality: Left;    History   Social History  . Marital Status: Widowed    Spouse Name: N/A  . Number of Children: N/A  . Years of Education: N/A   Occupational History  . Disabled    Social History Main Topics  . Smoking status: Former Smoker  -- 1.00 packs/day for 30 years    Types: Cigarettes    Quit date: 12/23/1992  . Smokeless tobacco: Never Used  . Alcohol Use: No  . Drug Use: No  . Sexual Activity: Not on file   Other Topics Concern  . Not on file   Social History Narrative   Married     Filed Vitals:   07/04/15 1527  BP: 140/82  Pulse: 63  Height: 6' 3"  (1.905 m)  Weight: 311 lb 12.8 oz (141.432 kg)  SpO2: 83%    PHYSICAL EXAM General: NAD HEENT: Normal. Neck: Difficult to assess JVP. Lungs: Diminished at  bases, no obvious rales/wheezes. CV: Nondisplaced PMI.  Regular rate and rhythm, normal S1/S2, no S3/S4, no murmur. 2+ pitting pretibial and periankle edema. Wearing compression stockings.  Abdomen: Soft, nontender, obese, +distention.  Neurologic: Alert and oriented x 3.  Psych: Normal affect. Skin: Normal. Musculoskeletal: No gross deformities. Extremities: No clubbing or cyanosis.   ECG: Most recent ECG reviewed.      ASSESSMENT AND PLAN: 1. Acute on chronic diastolic heart failure: Marked fluid accumulation and weight gain since February. I am going to hospitalize him for IV diuresis with close monitoring of renal function, and this is his preference as well. Will need BMET, chest xray, and would start with IV Lasix 40 mg bid.  2. CAD s/p PCI of LAD and D1: Stable ischemic heart disease. Continue aspirin 81 mg, metoprolol 50 mg twice daily, and pravastatin 20 mg daily.  3. Essential HTN: Borderline elevated today on current therapy which includes amlodipine 10 mg daily. I am going to hospitalize him for IV diuresis with close monitoring of renal function.   4. Hyperlipidemia: Lipids in 09/2014 showed TC 122, TG 121, HDL 31, LDL 67. Continue pravastatin 20 mg.   5. Erectile dysfunction: I previously prescribed Viagra. Instructed not to be taken with nitrates.  6. First degree AV block and bradycardia: He is entirely asymptomatic. Will continue metoprolol at current dose.  Dispo: I will  hospitalize for IV diuresis.  Time spent: 40 minutes, of which greater than 50% was spent reviewing symptoms, relevant blood tests and studies, and discussing management plan with the patient.   Kate Sable, M.D., F.A.C.C.

## 2015-07-04 NOTE — Patient Instructions (Signed)
Direct admit to 3 rd floor

## 2015-07-04 NOTE — H&P (Signed)
[facility] History and Physical  [name] ION:[id number] DOB: Feb 14, 1946 DOA: 07/04/2015  Referring physician: Dr. Bronson Ing - Cardiology PCP: Collene Mares, PA-C   Chief Complaint: Acute CHF  HPI: [name] is a 69 y.o. male  [name], is presenting to independent hospital for direct admission from his cardiologist's office, Dr. Bronson Ing. Patient reports one-week history of significantly increasing lower extremity edema and weight gain. Patient's diuretics were changed to torsemide 20 mg twice daily without improvement. Patient reports steady weight gain ever since his knee surgery back in February. Patient had lower extremity Dopplers which were negative for DVT. Denies orthopnea, dyspnea on exertion, chest pain, palpitations. Patient states that his swelling is improved with compression stockings but is "tired of wearing the stockings. " Patient states that he weighed 260 pounds back in the middle of February, but now weighs over 310 pounds. Patient states he exercises 4 days a week which includes long walks.  Review of Systems:  Constitutional:  No weight loss, night sweats, Fevers, chills, fatigue.  HEENT:  No headaches, Difficulty swallowing,Tooth/dental problems,Sore throat,  No sneezing, itching, ear ache, nasal congestion, post nasal drip,  Cardio-vascular: Per HPI GI:  No heartburn, indigestion, abdominal pain, nausea, vomiting, diarrhea, change in bowel habits, loss of appetite  Resp:   No shortness of breath with exertion or at rest. No excess mucus, no productive cough, No non-productive cough, No coughing up of blood.No change in color of mucus.No wheezing.No chest wall deformity  Skin:  no rash or lesions.  GU:  no dysuria, change in color of urine, no urgency or frequency. No flank pain.  Musculoskeletal:   No joint pain or swelling. No decreased range of motion. No back pain.  Psych:  No change in mood or affect. No depression or anxiety. No  memory loss.   Past Medical History  Diagnosis Date  . Diastolic heart failure     Pedal edema, "a long time ago"  . Hypertension     Exercise induced  . NSVT (nonsustained ventricular tachycardia)     Exercise induced  . 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 Obesity   . Degenerative joint disease   . Benign prostatic hypertrophy     Nocturia  . Erectile dysfunction   . High cholesterol   . CAD (coronary artery disease)     a.  NSTEMI 10/2012 s/p DES to LAD & DES to 1st diagonal with residual diffuse nonobstructive dz in LCx/RCA   . Chronic kidney disease, stage 3, mod decreased GFR     Creatinine of 1.5 in 03/2009  . Diabetes mellitus, type II 12/10/2012.  Marland Kitchen Anxiety   . Depression     hx of  . Flu 2013    hx of  . Hemorrhoids   . Tubular adenoma 2014  . GERD (gastroesophageal reflux disease)    Past Surgical History  Procedure Laterality Date  . Cardiac catheterization      with stent placement  . Back surgery  2007    x2  . Hernia repair    . Total knee arthroplasty Right 11/15/2013    Procedure: RIGHT TOTAL KNEE ARTHROPLASTY;  Surgeon: Mauri Pole, MD;  Location: [facility]S;  Service: Orthopedics;  Laterality: Right;  . Colonoscopy with esophagogastroduodenoscopy (egd) N/A 11/23/2013    Dr. Oneida Alar- TCS= left sided colitis and mild proctitis, moderate internal hemorrhoids, small nodule in the rectum, bx=tubular adenoma, EGD=-mild non-erosive gastritis  .  Left heart catheterization with coronary angiogram N/A 11/16/2012    Procedure: LEFT HEART CATHETERIZATION WITH CORONARY ANGIOGRAM;  Surgeon: Sherren Mocha, MD;  Location: [facility] CATH LAB;  Service: Cardiovascular;  Laterality: N/A;  . Percutaneous coronary stent intervention (pci-s)  11/16/2012    Procedure: PERCUTANEOUS CORONARY STENT INTERVENTION (PCI-S);  Surgeon: Sherren Mocha, MD;  Location: [facility] CATH LAB;  Service: Cardiovascular;;  . Total knee arthroplasty Left 03/07/2015    Procedure: LEFT  TOTAL KNEE ARTHROPLASTY;  Surgeon: Paralee Cancel, MD;  Location: [facility];  Service: Orthopedics;  Laterality: Left;   Social History:  reports that he quit smoking about 22 years ago. His smoking use included Cigarettes. He has a 30 pack-year smoking history. He has never used smokeless tobacco. He reports that he does not drink alcohol or use illicit drugs.  Allergies  Allergen Reactions  . Niacin And Related Other (See Comments)    Reaction: flushing and burning side effects even with extended release    Family History  Problem Relation Age of Onset  . Hypertension Other   . Cancer Other   . Heart disease    . Diabetes    . Colon cancer Neg Hx      Prior to Admission medications   Medication Sig Start Date End Date Taking? Authorizing Provider  acetaminophen (TYLENOL) 325 MG tablet Take 1-2 tablets (325-650 mg total) by mouth every 6 (six) hours as needed. Patient taking differently: Take 325-650 mg by mouth every 6 (six) hours as needed for moderate pain or headache.  11/17/13   Danae Orleans, PA-C  allopurinol (ZYLOPRIM) 300 MG tablet Take 300 mg by mouth every morning.     Historical Provider, MD  ALPRAZolam Duanne Moron) 0.5 MG tablet Take one tablet by mouth twice daily as needed for anxiety 03/09/15   Danae Orleans, PA-C  amLODipine (NORVASC) 10 MG tablet Take 10 mg by mouth every morning.     Historical Provider, MD  COLCRYS 0.6 MG tablet Take 0.6 mg by mouth daily.  12/31/13   Historical Provider, MD  docusate sodium (COLACE) 100 MG capsule Take 1 capsule (100 mg total) by mouth 2 (two) times daily. 03/09/15   Danae Orleans, PA-C  ferrous sulfate 325 (65 FE) MG tablet Take 1 tablet (325 mg total) by mouth 3 (three) times daily after meals. 03/09/15   Danae Orleans, PA-C  gabapentin (NEURONTIN) 300 MG capsule Take 300 mg by mouth 2 (two) times daily.     Historical Provider, MD  metoprolol (LOPRESSOR) 50 MG tablet Take 50 mg by mouth 2 (two) times daily.    Historical Provider, MD    NITROSTAT 0.4 MG SL tablet DISSOLVE ONE TABLET UNDER THE TONGUE AS NEEDED FOR CHEST PAIN.    Herminio Commons, MD  omeprazole (PRILOSEC) 20 MG capsule Take 20 mg by mouth every morning.  01/27/14   Historical Provider, MD  oxyCODONE (OXY IR/ROXICODONE) 5 MG immediate release tablet Take 1 to 3 Tablets By Mouth Every 4 Hours As Needed For Mild, Moderate, Severe Pain. Take 1 Tablet By Mouth Every 4 Hours As Needed For Mild Pain. Take 2 Tablets (10 MG) By Mouth Every 4 Hours 03/29/15   Estill Dooms, MD  oxyCODONE-acetaminophen (PERCOCET/ROXICET) 5-325 MG per tablet 1-2 by mouth every 4 hours as needed for moderate severe pain 03/21/15   Estill Dooms, MD  polyethylene glycol [facility] Floria Raveling) packet Take 17 g by mouth 2 (two) times daily. 03/09/15   Danae Orleans, PA-C  pravastatin (PRAVACHOL)  20 MG tablet Take 20 mg by mouth every morning.     Historical Provider, MD  saxagliptin HCl (ONGLYZA) 2.5 MG TABS tablet Take 2.5 mg by mouth every morning.    Historical Provider, MD  torsemide (DEMADEX) 20 MG tablet Take 20 mg by mouth. 1-2 tabs BID per [facility] 06/21/15 06/21/15   Historical Provider, MD   Physical Exam: Filed Vitals:   07/04/15 1746  BP: 123/53  Pulse: 50  Temp: 98 F (36.7 C)  TempSrc: Oral  Resp: 20  SpO2: 98%    Wt Readings from Last 3 Encounters:  07/04/15 141.432 kg (311 lb 12.8 oz)  03/07/15 134.718 kg (297 lb)  03/02/15 134.718 kg (297 lb)    General:  Appears calm and comfortable Eyes:  PERRL, normal lids, irises & conjunctiva ENT:  grossly normal hearing, lips & tongue Neck:  no LAD, masses or thyromegaly Cardiovascular: Faint heart sounds, no Carotid murmur, No appreciable JVD,  RRR, no m/r/g. 2+ pitting LE edema to the level of the mid leg. Compression stockings in place. Marland Kitchen Respiratory:  CTA bilaterally, no w/r/r. Normal respiratory effort. Abdomen:  soft, ntnd Skin:  no rash or induration seen on limited exam Musculoskeletal:  grossly normal tone  BUE/BLE Psychiatric:  grossly normal mood and affect, speech fluent and appropriate Neurologic:  grossly non-focal.          Labs on Admission:  Basic Metabolic Panel: No results for input(s): NA, K, CL, CO2, GLUCOSE, BUN, CREATININE, CALCIUM, MG, PHOS in the last 168 hours. Liver Function Tests: No results for input(s): AST, ALT, ALKPHOS, BILITOT, PROT, ALBUMIN in the last 168 hours. No results for input(s): LIPASE, AMYLASE in the last 168 hours. No results for input(s): AMMONIA in the last 168 hours. CBC: No results for input(s): WBC, NEUTROABS, HGB, HCT, MCV, PLT in the last 168 hours. Cardiac Enzymes: No results for input(s): CKTOTAL, CKMB, CKMBINDEX, TROPONINI in the last 168 hours.  BNP (last 3 results) No results for input(s): BNP in the last 8760 hours.  ProBNP (last 3 results) No results for input(s): PROBNP in the last 8760 hours.  CBG: No results for input(s): GLUCAP in the last 168 hours.  Radiological Exams on Admission: No results found.    Assessment/Plan Principal Problem:   Acute on chronic diastolic congestive heart failure Active Problems:   Hypertension   Chronic kidney disease, stage 3, cardiorenal syndrome   Diabetes mellitus   Edema   Acute congestive heart failure   HLD (hyperlipidemia)   Anxiety state   GERD (gastroesophageal reflux disease)   Heart block    Acute on Chronic Diastolilc CHF: Patient is a direct admit from [place] office. 50lb weight gain over the last 3 months. No improvement with increased diuresis at home of torsemide 20 mg twice daily. Last echo from 2014 showing EF of 50-55% and grade 1 diastolic dysfunction. - Telemetry - Follow-up Dr.Koneswaran's recommendations - Strict I's and O's, daily weights - Repeat echo - Chest x-ray - EKG - IV Lasix 40 mg twice a day - BMET - continue bblocker.  - consider adding ACEi  CKD: Recent elevation in Cr likely due to worsening cardiorenal syndrome and increased use of  diuretics and chronic DM.  - BMET  HLD: - Continue statin  DM: Last A1c 7.7 in 2014. On orals only - SSI - A1c  HTN:  - continue Norvasc  First-degree AV block/bradycardia: Asymptomatic. Chronic condition. - Continue metoprolol  CHronic pain / Neuropathic pain:  -  continue oxycodone - Neuronitn  GERD:  - continue PPI  Anxiety: - continue Xanax  Gout: no recent flares - continue colhicine and allopurinol  Code Status: FULL DVT Prophylaxis: Hep Family Communication: None Disposition Plan: Pending improvement  Yanna Leaks Lenna Sciara, MD Family Medicine [facility] [email or web address] Password [email or web address]

## 2015-07-05 ENCOUNTER — Inpatient Hospital Stay (HOSPITAL_COMMUNITY): Payer: Medicare Other

## 2015-07-05 DIAGNOSIS — I509 Heart failure, unspecified: Secondary | ICD-10-CM

## 2015-07-05 DIAGNOSIS — K219 Gastro-esophageal reflux disease without esophagitis: Secondary | ICD-10-CM

## 2015-07-05 DIAGNOSIS — I5022 Chronic systolic (congestive) heart failure: Secondary | ICD-10-CM

## 2015-07-05 DIAGNOSIS — E785 Hyperlipidemia, unspecified: Secondary | ICD-10-CM

## 2015-07-05 DIAGNOSIS — I5033 Acute on chronic diastolic (congestive) heart failure: Secondary | ICD-10-CM

## 2015-07-05 DIAGNOSIS — N183 Chronic kidney disease, stage 3 (moderate): Secondary | ICD-10-CM

## 2015-07-05 LAB — GLUCOSE, CAPILLARY
GLUCOSE-CAPILLARY: 100 mg/dL — AB (ref 65–99)
GLUCOSE-CAPILLARY: 118 mg/dL — AB (ref 65–99)
Glucose-Capillary: 123 mg/dL — ABNORMAL HIGH (ref 65–99)
Glucose-Capillary: 71 mg/dL (ref 65–99)

## 2015-07-05 LAB — BASIC METABOLIC PANEL
Anion gap: 7 (ref 5–15)
BUN: 54 mg/dL — ABNORMAL HIGH (ref 6–20)
CO2: 28 mmol/L (ref 22–32)
Calcium: 8.5 mg/dL — ABNORMAL LOW (ref 8.9–10.3)
Chloride: 106 mmol/L (ref 101–111)
Creatinine, Ser: 2.01 mg/dL — ABNORMAL HIGH (ref 0.61–1.24)
GFR calc Af Amer: 37 mL/min — ABNORMAL LOW (ref >60)
GFR calc non Af Amer: 32 mL/min — ABNORMAL LOW (ref >60)
GLUCOSE: 116 mg/dL — AB (ref 65–99)
Potassium: 3.9 mmol/L (ref 3.5–5.1)
SODIUM: 141 mmol/L (ref 135–145)

## 2015-07-05 LAB — TSH: TSH: 1.725 u[IU]/mL (ref 0.350–4.500)

## 2015-07-05 MED ORDER — METOPROLOL TARTRATE 25 MG PO TABS
25.0000 mg | ORAL_TABLET | Freq: Two times a day (BID) | ORAL | Status: DC
  Administered 2015-07-06: 25 mg via ORAL
  Filled 2015-07-05 (×5): qty 1

## 2015-07-05 NOTE — Care Management Note (Signed)
Case Management Note  Patient Details  Name: [name] MRN: [id number] Date of Birth: 1946/10/30  Subjective/Objective:                  Pt admitted from home with CHF. Pt lives alone and will return home at discharge. Pt is independent with ADl's. Pt has a cane that he uses prn.  Action/Plan: ? Need for [facility] RN at discharge for CHF monitoring. Will continue to follow for discharge planning needs. Will make referral to [facility].  Expected Discharge Date:                  Expected Discharge Plan:  Home/Self Care  In-House Referral:  NA  Discharge planning Services  CM Consult  Post Acute Care Choice:  NA Choice offered to:  NA  DME Arranged:    DME Agency:     HH Arranged:    HH Agency:     Status of Service:  Completed, signed off  Medicare Important Message Given:    Date Medicare IM Given:    Medicare IM give by:    Date Additional Medicare IM Given:    Additional Medicare Important Message give by:     If discussed at [place] of Stay Meetings, dates discussed:    Additional Comments:  Joylene Draft, RN 07/05/2015, 1:54 PM

## 2015-07-05 NOTE — Progress Notes (Signed)
[facility] PROGRESS NOTE  [name] GLO:[id number] DOB: [birth year] DOA: 07/04/2015 PCP: [name] Mares, PA-C  Assessment/Plan: 1. Acute on chronic diastolic congestive heart failure. Patient admitted to the hospital after feeling adjustment in his outpatient diuretics. He was started on intravenous Lasix with fair urine output. Cardiology is following. Discussed the importance of fluid and salt restriction. The patient reports that he was drinking excessive amounts of water at home which is likely contributing to his volume overload. Would avoid ACE inhibitor due to renal dysfunction. Beta blocker was decreased due to bradycardia. 2. CKD stage III. Creatinine appears to be near baseline. Continue to monitor in the setting of diuresis. 3. Hyperlipidemia. Continue statin 4. Diabetes. Continue sliding scale insulin and follow blood sugars. 5. Hypertension. Currently stable. Continue current treatments. 6. GERD. Continue proton pump inhibitors.  Code Status: full code Family Communication: discussed with patient Disposition Plan: discharge home once improved   Consultants:  cardiology  Procedures:    Antibiotics:    HPI/Subjective: No chest pain, no orthopnea, no shortness of breath, complains of worsening edema in his legs and lower abdomen  Objective: Filed Vitals:   07/05/15 1115  BP: 134/67  Pulse: 45  Temp:   Resp:     Intake/Output Summary (Last 24 hours) at 07/05/15 1247 Last data filed at 07/05/15 0900  Gross per 24 hour  Intake    240 ml  Output   2450 ml  Net  -2210 ml   Filed Weights   07/04/15 1958 07/05/15 0559  Weight: 141.613 kg (312 lb 3.2 oz) 139.164 kg (306 lb 12.8 oz)    Exam:   General:  NAD  Cardiovascular: s1, s2, rrr  Respiratory: cta b  Abdomen: soft, nt, nd, bs+  Musculoskeletal: 2-3+ edema b/l   Data Reviewed: Basic Metabolic Panel:  Recent Labs Lab 07/04/15 1932 07/05/15 0701  NA 140 141  K 4.1 3.9  CL 103  106  CO2 26 28  GLUCOSE 105* 116*  BUN 54* 54*  CREATININE 2.22* 2.01*  CALCIUM 8.9 8.5*  MG 1.7  --    Liver Function Tests: No results for input(s): AST, ALT, ALKPHOS, BILITOT, PROT, ALBUMIN in the last 168 hours. No results for input(s): LIPASE, AMYLASE in the last 168 hours. No results for input(s): AMMONIA in the last 168 hours. CBC:  Recent Labs Lab 07/04/15 1932  WBC 5.1  HGB 11.8*  HCT 36.5*  MCV 98.9  PLT 183   Cardiac Enzymes: No results for input(s): CKTOTAL, CKMB, CKMBINDEX, TROPONINI in the last 168 hours. BNP (last 3 results) No results for input(s): BNP in the last 8760 hours.  ProBNP (last 3 results) No results for input(s): PROBNP in the last 8760 hours.  CBG:  Recent Labs Lab 07/04/15 1841 07/04/15 2221 07/05/15 0739 07/05/15 1121  GLUCAP 73 93 123* 71    No results found for this or any previous visit (from the past 240 hour(s)).   Studies: Dg Chest 2 View  07/04/2015   CLINICAL DATA:  Fluid retention, diastolic heart failure, hypertension, diabetes, former smoker  EXAM: CHEST  2 VIEW  COMPARISON:  03/11/2015  FINDINGS: Stable mild cardiomegaly without CHF or edema. Minor streaky basilar atelectasis. No focal pneumonia, collapse or consolidation. Negative for effusion or pneumothorax. Trachea midline. Degenerative changes of the spine. Stable exam.  IMPRESSION: Cardiomegaly and basilar atelectasis.  No interval change.   Electronically Signed   By: [name].  Shick M.D.   On: 07/04/2015 20:03    Scheduled  Meds: . allopurinol  300 mg Oral q morning - 10a  . amLODipine  10 mg Oral q morning - 10a  . colchicine  0.6 mg Oral Daily  . docusate sodium  100 mg Oral BID  . ferrous sulfate  325 mg Oral TID PC  . furosemide  40 mg Intravenous BID  . gabapentin  300 mg Oral BID  . heparin  5,000 Units Subcutaneous 3 times per day  . insulin aspart  0-9 Units Subcutaneous TID WC  . metoprolol  25 mg Oral BID  . pantoprazole  40 mg Oral Daily  .  polyethylene glycol  17 g Oral BID  . pravastatin  20 mg Oral q morning - 10a  . sodium chloride  3 mL Intravenous Q12H   Continuous Infusions:   Principal Problem:   Acute on chronic diastolic congestive heart failure Active Problems:   Hypertension   Chronic kidney disease, stage 3, cardiorenal syndrome   Diabetes mellitus   Edema   Acute congestive heart failure   HLD (hyperlipidemia)   Anxiety state   GERD (gastroesophageal reflux disease)   Heart block    Time spent: 61mns    [name]  [facility] Pager [phone] If 7PM-7AM, please contact night-coverage at [email or web address], password [email or web address]/13/2016, 12:47 PM  LOS: 1 day

## 2015-07-05 NOTE — Care Management Note (Signed)
Case Management Note  Patient Details  Name: [name] MRN: [id number] Date of Birth: 1946/01/29  Subjective/Objective:                    Action/Plan:   Expected Discharge Date:                  Expected Discharge Plan:  Home/Self Care  In-House Referral:  NA  Discharge planning Services  CM Consult  Post Acute Care Choice:  NA Choice offered to:  NA  DME Arranged:    DME Agency:     HH Arranged:    HH Agency:     Status of Service:  Completed, signed off  Medicare Important Message Given:    Date Medicare IM Given:    Medicare IM give by:    Date Additional Medicare IM Given:    Additional Medicare Important Message give by:     If discussed at [place] of Stay Meetings, dates discussed:    Additional Comments: Pt changed to OBS. CC 44 given and placed on chart. Christinia Gully Tim[place], RN 07/05/2015, 3:01 PM

## 2015-07-05 NOTE — Progress Notes (Signed)
Subjective:  Feeling a little better. Doesn't think his kidneys are putting enough out.  Objective:  Vital Signs in the last 24 hours: Temp:  [97.5 F (36.4 C)-98 F (36.7 C)] 97.7 F (36.5 C) (07/13 0559) Pulse Rate:  [41-63] 45 (07/13 0559) Resp:  [18-20] 18 (07/13 0559) BP: (121-142)/(53-82) 142/55 mmHg (07/13 0559) SpO2:  [83 %-100 %] 100 % (07/13 0559) Weight:  [306 lb 12.8 oz (139.164 kg)-312 lb 3.2 oz (141.613 kg)] 306 lb 12.8 oz (139.164 kg) (07/13 0559)  Intake/Output from previous day: 07/12 0701 - 07/13 0700 In: -  Out: 2450 [Urine:2450] Intake/Output from this shift:    Physical Exam: NECK: Without JVD, HJR, or bruit LUNGS: Decreased breath sounds without rales HEART: Regular rate and rhythm, no murmur, gallop, rub, bruit, thrill, or heave EXTREMITIES: anasarca and edema all the way up his abdomen   Lab Results:  Recent Labs  07/04/15 1932  WBC 5.1  HGB 11.8*  PLT 183    Recent Labs  07/04/15 1932 07/05/15 0701  NA 140 141  K 4.1 3.9  CL 103 106  CO2 26 28  GLUCOSE 105* 116*  BUN 54* 54*  CREATININE 2.22* 2.01*   No results for input(s): TROPONINI in the last 72 hours.  Invalid input(s): CK, MB Hepatic Function Panel No results for input(s): PROT, ALBUMIN, AST, ALT, ALKPHOS, BILITOT, BILIDIR, IBILI in the last 72 hours. No results for input(s): CHOL in the last 72 hours. No results for input(s): PROTIME in the last 72 hours.  Imaging:   Cardiac Studies:  Assessment/Plan:  1. Acute on chronic diastolic heart failure: Marked fluid accumulation and weight gain since February.Diuresed 2450 cc with IV Lasix 40 mg bid. Continue current diuresis.  2. CAD s/p PCI of LAD and D1: Stable ischemic heart disease. Continue aspirin 81 mg, metoprolol 50 mg twice daily, and pravastatin 20 mg daily.  3. Essential HTN: Controlled.   4. Hyperlipidemia: Lipids in 09/2014 showed TC 122, TG 121, HDL 31, LDL 67. Continue pravastatin 20 mg.    5. Erectile  dysfunction: previously prescribed Viagra. Instructed not to be taken with nitrates.  6. First degree AV block and bradycardia: He is entirely asymptomatic. Will continue metoprolol at current dose.  7. Chronic renal insufficiency: Crt stable at 2.01     LOS: 1 day    Ermalinda Barrios 07/05/2015, 8:06 AM   Patient seen and discussed with PA Bonnell Public, I agree with her documentation. 69 yo male history of HTN,CAD with prior stenting, CKD, OSA admitted with acute on chronic diastolic heart failure.   Echo 10/2013 LVEF 64-15%, grade I diastolic dysfunction Mg 1.7, Hgb 11.8, Plt 183, K 4.1, Cr 2.22 (baseline 1.7-2.1) CXR cardiomegaly  EKG sinus brady 40s with 1st degree AV block  Acute on chronic diastolic HF, he is negative 2.4 liters since admission. He is on lasix 24m IV bid, Cr trending down with diuresis consistent with venous congestion and CHF. Continue current diuretic dosing.  Bradycardia will decrease lopressor to 246mbid, check TSH.   J Zandra AbtsD

## 2015-07-06 ENCOUNTER — Observation Stay (INDEPENDENT_AMBULATORY_CARE_PROVIDER_SITE_OTHER): Payer: Medicare Other

## 2015-07-06 DIAGNOSIS — I509 Heart failure, unspecified: Secondary | ICD-10-CM | POA: Diagnosis not present

## 2015-07-06 LAB — BASIC METABOLIC PANEL
Anion gap: 9 (ref 5–15)
BUN: 57 mg/dL — AB (ref 6–20)
CHLORIDE: 107 mmol/L (ref 101–111)
CO2: 28 mmol/L (ref 22–32)
Calcium: 8.7 mg/dL — ABNORMAL LOW (ref 8.9–10.3)
Creatinine, Ser: 2.04 mg/dL — ABNORMAL HIGH (ref 0.61–1.24)
GFR calc Af Amer: 36 mL/min — ABNORMAL LOW (ref >60)
GFR calc non Af Amer: 31 mL/min — ABNORMAL LOW (ref >60)
GLUCOSE: 110 mg/dL — AB (ref 65–99)
POTASSIUM: 4.2 mmol/L (ref 3.5–5.1)
SODIUM: 144 mmol/L (ref 135–145)

## 2015-07-06 LAB — HEMOGLOBIN A1C
Hgb A1c MFr Bld: 5.6 % (ref 4.8–5.6)
MEAN PLASMA GLUCOSE: 114 mg/dL

## 2015-07-06 LAB — GLUCOSE, CAPILLARY
GLUCOSE-CAPILLARY: 105 mg/dL — AB (ref 65–99)
GLUCOSE-CAPILLARY: 132 mg/dL — AB (ref 65–99)
GLUCOSE-CAPILLARY: 99 mg/dL (ref 65–99)
Glucose-Capillary: 107 mg/dL — ABNORMAL HIGH (ref 65–99)

## 2015-07-06 NOTE — Progress Notes (Signed)
Patient ID: [name], male   DOB: 02/07/1946, 69 y.o.   MRN: [id number]   Subjective:    Denies any symptoms  Objective:   Temp:  [97.4 F (36.3 C)-98.5 F (36.9 C)] 98.5 F (36.9 C) (07/14 0558) Pulse Rate:  [44-88] 88 (07/14 0558) Resp:  [18] 18 (07/14 0558) BP: (124-140)/(64-72) 140/64 mmHg (07/14 0558) SpO2:  [100 %] 100 % (07/14 0558) Weight:  [303 lb 12.8 oz (137.803 kg)] 303 lb 12.8 oz (137.803 kg) (07/14 0558) Last BM Date: 07/05/15  Filed Weights   07/04/15 1958 07/05/15 0559 07/06/15 0558  Weight: 312 lb 3.2 oz (141.613 kg) 306 lb 12.8 oz (139.164 kg) 303 lb 12.8 oz (137.803 kg)    Intake/Output Summary (Last 24 hours) at 07/06/15 0844 Last data filed at 07/06/15 0559  Gross per 24 hour  Intake    720 ml  Output   2400 ml  Net  -1680 ml    Telemetry: sinus brady Exam:  General: NAD  Resp: CTAB  Cardiac: regular, rate 50, no m/r/g, no JVD  GI: abdomen soft, NT, ND  MSK:1+ bilateral edema  Neuro: no focal deficits  Psych: appropriate affect  Lab Results:  Basic Metabolic Panel:  Recent Labs Lab 07/04/15 1932 07/05/15 0701 07/06/15 0630  NA 140 141 144  K 4.1 3.9 4.2  CL 103 106 107  CO2 26 28 28   GLUCOSE 105* 116* 110*  BUN 54* 54* 57*  CREATININE 2.22* 2.01* 2.04*  CALCIUM 8.9 8.5* 8.7*  MG 1.7  --   --     Liver Function Tests: No results for input(s): AST, ALT, ALKPHOS, BILITOT, PROT, ALBUMIN in the last 168 hours.  CBC:  Recent Labs Lab 07/04/15 1932  WBC 5.1  HGB 11.8*  HCT 36.5*  MCV 98.9  PLT 183    Cardiac Enzymes: No results for input(s): CKTOTAL, CKMB, CKMBINDEX, TROPONINI in the last 168 hours.  BNP: No results for input(s): PROBNP in the last 8760 hours.  Coagulation: No results for input(s): INR in the last 168 hours.  ECG:   Medications:   Scheduled Medications: . allopurinol  300 mg Oral q morning - 10a  . amLODipine  10 mg Oral q morning - 10a  . colchicine  0.6 mg Oral Daily  . docusate  sodium  100 mg Oral BID  . ferrous sulfate  325 mg Oral TID PC  . furosemide  40 mg Intravenous BID  . gabapentin  300 mg Oral BID  . heparin  5,000 Units Subcutaneous 3 times per day  . insulin aspart  0-9 Units Subcutaneous TID WC  . metoprolol  25 mg Oral BID  . pantoprazole  40 mg Oral Daily  . polyethylene glycol  17 g Oral BID  . pravastatin  20 mg Oral q morning - 10a  . sodium chloride  3 mL Intravenous Q12H     Infusions:     PRN Medications:  ALPRAZolam, ondansetron **OR** ondansetron (ZOFRAN) IV, oxyCODONE-acetaminophen     Assessment/Plan    1. Acute on chronic diastolic HF - patient is negative 1.7 liters yesterday, negative 4.1 liters since admission. He is on lasix 40m IV bid, renal function remains stable - continue current diuretics today   2. SInus bradycardycardia - lopressor decreased yesterday, rates up some. Continue to follow, denies any symptoms. Will defer further dose adjusting to his primary cardiologist who will round on him tomorrow.    JCarlyle Dolly M.D.

## 2015-07-06 NOTE — Progress Notes (Signed)
[facility] PROGRESS NOTE  [name] TOI:[id number] DOB: October 20, 1946 DOA: 07/04/2015 PCP: Collene Mares, PA-C  Assessment/Plan: 1. Acute on chronic diastolic congestive heart failure. Patient admitted to the hospital after failing adjustment in his outpatient diuretics. He ison intravenous Lasix with fair urine output. Cardiology is following. His weight is down approximately 9 pounds since admission. Discussed the importance of fluid and salt restriction. The patient reports that he was drinking excessive amounts of water at home which is likely contributing to his volume overload. Would avoid ACE inhibitor due to renal dysfunction. Beta blocker was decreased due to bradycardia. Continue current treatments with diuresis. 2. CKD stage III. Creatinine appears to be near baseline. Continue to monitor in the setting of diuresis. 3. Hyperlipidemia. Continue statin 4. Diabetes. Continue sliding scale insulin and follow blood sugars. 5. Hypertension. Currently stable. Continue current treatments. 6. GERD. Continue proton pump inhibitors.  Code Status: full code Family Communication: discussed with patient Disposition Plan: discharge home once improved   Consultants:  cardiology  Procedures:    Antibiotics:    HPI/Subjective: No shortness of breath. Continues to complain of LE edema. Weight is down 9lbs since admission  Objective: Filed Vitals:   07/06/15 0924  BP: 136/51  Pulse: 69  Temp:   Resp:     Intake/Output Summary (Last 24 hours) at 07/06/15 1142 Last data filed at 07/06/15 0929  Gross per 24 hour  Intake    723 ml  Output   2700 ml  Net  -1977 ml   Filed Weights   07/04/15 1958 07/05/15 0559 07/06/15 0558  Weight: 141.613 kg (312 lb 3.2 oz) 139.164 kg (306 lb 12.8 oz) 137.803 kg (303 lb 12.8 oz)    Exam:   General:  NAD, sitting up in bed  Cardiovascular: s1, s2, bradycardic  Respiratory: clear bilaterally  Abdomen: soft, nt, nd,  bs+  Musculoskeletal: 2+ edema b/l   Data Reviewed: Basic Metabolic Panel:  Recent Labs Lab 07/04/15 1932 07/05/15 0701 07/06/15 0630  NA 140 141 144  K 4.1 3.9 4.2  CL 103 106 107  CO2 26 28 28   GLUCOSE 105* 116* 110*  BUN 54* 54* 57*  CREATININE 2.22* 2.01* 2.04*  CALCIUM 8.9 8.5* 8.7*  MG 1.7  --   --    Liver Function Tests: No results for input(s): AST, ALT, ALKPHOS, BILITOT, PROT, ALBUMIN in the last 168 hours. No results for input(s): LIPASE, AMYLASE in the last 168 hours. No results for input(s): AMMONIA in the last 168 hours. CBC:  Recent Labs Lab 07/04/15 1932  WBC 5.1  HGB 11.8*  HCT 36.5*  MCV 98.9  PLT 183   Cardiac Enzymes: No results for input(s): CKTOTAL, CKMB, CKMBINDEX, TROPONINI in the last 168 hours. BNP (last 3 results) No results for input(s): BNP in the last 8760 hours.  ProBNP (last 3 results) No results for input(s): PROBNP in the last 8760 hours.  CBG:  Recent Labs Lab 07/05/15 0739 07/05/15 1121 07/05/15 1641 07/05/15 2056 07/06/15 0738  GLUCAP 123* 71 100* 118* 107*    No results found for this or any previous visit (from the past 240 hour(s)).   Studies: Dg Chest 2 View  07/04/2015   CLINICAL DATA:  Fluid retention, diastolic heart failure, hypertension, diabetes, former smoker  EXAM: CHEST  2 VIEW  COMPARISON:  03/11/2015  FINDINGS: Stable mild cardiomegaly without CHF or edema. Minor streaky basilar atelectasis. No focal pneumonia, collapse or consolidation. Negative for effusion or pneumothorax. Trachea midline.  Degenerative changes of the spine. Stable exam.  IMPRESSION: Cardiomegaly and basilar atelectasis.  No interval change.   Electronically Signed   By: Jerilynn Mages.  Shick M.D.   On: 07/04/2015 20:03    Scheduled Meds: . allopurinol  300 mg Oral q morning - 10a  . amLODipine  10 mg Oral q morning - 10a  . colchicine  0.6 mg Oral Daily  . docusate sodium  100 mg Oral BID  . ferrous sulfate  325 mg Oral TID PC  .  furosemide  40 mg Intravenous BID  . gabapentin  300 mg Oral BID  . heparin  5,000 Units Subcutaneous 3 times per day  . insulin aspart  0-9 Units Subcutaneous TID WC  . metoprolol  25 mg Oral BID  . pantoprazole  40 mg Oral Daily  . polyethylene glycol  17 g Oral BID  . pravastatin  20 mg Oral q morning - 10a  . sodium chloride  3 mL Intravenous Q12H   Continuous Infusions:   Principal Problem:   Acute on chronic diastolic congestive heart failure Active Problems:   Hypertension   Chronic kidney disease, stage 3, cardiorenal syndrome   Diabetes mellitus   Edema   Acute congestive heart failure   HLD (hyperlipidemia)   Anxiety state   GERD (gastroesophageal reflux disease)   Heart block   CHF (congestive heart failure)    Time spent: 50mns    [name]  [facility] Pager [phone] If 7PM-7AM, please contact night-coverage at [email or web address], password [email or web address]7/14/2016, 11:42 AM  LOS: 2 days

## 2015-07-07 DIAGNOSIS — I2510 Atherosclerotic heart disease of native coronary artery without angina pectoris: Secondary | ICD-10-CM

## 2015-07-07 DIAGNOSIS — I10 Essential (primary) hypertension: Secondary | ICD-10-CM

## 2015-07-07 DIAGNOSIS — F411 Generalized anxiety disorder: Secondary | ICD-10-CM

## 2015-07-07 LAB — GLUCOSE, CAPILLARY
GLUCOSE-CAPILLARY: 105 mg/dL — AB (ref 65–99)
GLUCOSE-CAPILLARY: 138 mg/dL — AB (ref 65–99)
Glucose-Capillary: 137 mg/dL — ABNORMAL HIGH (ref 65–99)

## 2015-07-07 LAB — BASIC METABOLIC PANEL
Anion gap: 9 (ref 5–15)
BUN: 58 mg/dL — ABNORMAL HIGH (ref 6–20)
CALCIUM: 8.9 mg/dL (ref 8.9–10.3)
CO2: 28 mmol/L (ref 22–32)
Chloride: 106 mmol/L (ref 101–111)
Creatinine, Ser: 2.03 mg/dL — ABNORMAL HIGH (ref 0.61–1.24)
GFR calc Af Amer: 37 mL/min — ABNORMAL LOW (ref >60)
GFR calc non Af Amer: 32 mL/min — ABNORMAL LOW (ref >60)
GLUCOSE: 102 mg/dL — AB (ref 65–99)
Potassium: 4.1 mmol/L (ref 3.5–5.1)
SODIUM: 143 mmol/L (ref 135–145)

## 2015-07-07 MED ORDER — ASPIRIN 81 MG PO CHEW
81.0000 mg | CHEWABLE_TABLET | Freq: Every day | ORAL | Status: DC
  Administered 2015-07-07 – 2015-07-10 (×4): 81 mg via ORAL
  Filled 2015-07-07 (×4): qty 1

## 2015-07-07 NOTE — Care Management Note (Signed)
Case Management Note  Patient Details  Name: [name] MRN: [id number] Date of Birth: 10/12/1946  Subjective/Objective:                    Action/Plan:   Expected Discharge Date:                  Expected Discharge Plan:  Home/Self Care  In-House Referral:  NA  Discharge planning Services  CM Consult  Post Acute Care Choice:  NA Choice offered to:  NA  DME Arranged:    DME Agency:     HH Arranged:    HH Agency:     Status of Service:  Completed, signed off  Medicare Important Message Given:    Date Medicare IM Given:    Medicare IM give by:    Date Additional Medicare IM Given:    Additional Medicare Important Message give by:     If discussed at [name] of Stay Meetings, dates discussed:    Additional Comments: Anticipate discharge over the weekend. IF pt needs home health services weekend staff can arrange. Christinia Gully [place], RN 07/07/2015, 1:36 PM

## 2015-07-07 NOTE — Care Management Important Message (Signed)
Important Message  Patient Details  Name: [name] MRN: [id number] Date of Birth: January 19, 1946   Medicare Important Message Given:  Yes-second notification given    Joylene Draft, RN 07/07/2015, 1:37 PM

## 2015-07-07 NOTE — Consult Note (Signed)
   [facility] CM Inpatient Consult   07/07/2015  [name] [birth year] [id number]   Referral received for [facility] Care Management services. Called and explained and discussed [facility] services. After detailed explanation of services, patient pleasantly states " I do not think I am interested". Will make inpatient RNCM aware that patient declined services.  Marthenia Rolling, MSN-Ed, RN,BSN [facility] Liaison [phone]

## 2015-07-07 NOTE — Progress Notes (Signed)
[facility] PROGRESS NOTE  MARKAS ALDREDGE CXK:[id number] DOB: Nov 05, 1946 DOA: 07/04/2015 PCP: Collene Mares, PA-C  Assessment/Plan: 1. Acute on chronic diastolic congestive heart failure. Patient admitted to the hospital after failing adjustment in his outpatient diuretics. He is on intravenous Lasix with fair urine output. Cardiology is following. His weight is down approximately 13 pounds since admission. Discussed the importance of fluid and salt restriction. The patient reports that he was drinking excessive amounts of water at home which is likely contributing to his volume overload. Would avoid ACE inhibitor due to renal dysfunction. Beta blocker was decreased due to bradycardia. Continue current treatments with diuresis. I suspect he will need a few more days of inpatient treatment. 2. CKD stage III. Creatinine appears to be near baseline. Continue to monitor in the setting of diuresis. 3. Hyperlipidemia. Continue statin 4. Diabetes. Continue sliding scale insulin and follow blood sugars. 5. Hypertension. Currently stable. Continue current treatments. 6. GERD. Continue proton pump inhibitors.  Code Status: full code Family Communication: discussed with patient Disposition Plan: discharge home once improved   Consultants:  cardiology  Procedures:    Antibiotics:    HPI/Subjective: No chest pain, no shortness of breath. Feels that legs are still edematous. He is encouraged by decrease in weight. He is frustrated with being in the hospital.   Objective: Filed Vitals:   07/07/15 0946  BP: 133/45  Pulse: 55  Temp:   Resp:     Intake/Output Summary (Last 24 hours) at 07/07/15 1111 Last data filed at 07/07/15 0700  Gross per 24 hour  Intake    720 ml  Output   1300 ml  Net   -580 ml   Filed Weights   07/05/15 0559 07/06/15 0558 07/07/15 0509  Weight: 139.164 kg (306 lb 12.8 oz) 137.803 kg (303 lb 12.8 oz) 136.034 kg (299 lb 14.4 oz)    Exam:   General:   NAD, sitting in chair  Cardiovascular: s1, s2, RRR  Respiratory: cta b  Abdomen: soft, nt, nd, bs+  Musculoskeletal: 1+ edema b/l   Data Reviewed: Basic Metabolic Panel:  Recent Labs Lab 07/04/15 1932 07/05/15 0701 07/06/15 0630 07/07/15 0651  NA 140 141 144 143  K 4.1 3.9 4.2 4.1  CL 103 106 107 106  CO2 26 28 28 28   GLUCOSE 105* 116* 110* 102*  BUN 54* 54* 57* 58*  CREATININE 2.22* 2.01* 2.04* 2.03*  CALCIUM 8.9 8.5* 8.7* 8.9  MG 1.7  --   --   --    Liver Function Tests: No results for input(s): AST, ALT, ALKPHOS, BILITOT, PROT, ALBUMIN in the last 168 hours. No results for input(s): LIPASE, AMYLASE in the last 168 hours. No results for input(s): AMMONIA in the last 168 hours. CBC:  Recent Labs Lab 07/04/15 1932  WBC 5.1  HGB 11.8*  HCT 36.5*  MCV 98.9  PLT 183   Cardiac Enzymes: No results for input(s): CKTOTAL, CKMB, CKMBINDEX, TROPONINI in the last 168 hours. BNP (last 3 results) No results for input(s): BNP in the last 8760 hours.  ProBNP (last 3 results) No results for input(s): PROBNP in the last 8760 hours.  CBG:  Recent Labs Lab 07/06/15 0738 07/06/15 1146 07/06/15 1625 07/06/15 2035 07/07/15 0820  GLUCAP 107* 105* 132* 99 105*    No results found for this or any previous visit (from the past 240 hour(s)).   Studies: No results found.  Scheduled Meds: . allopurinol  300 mg Oral q morning - 10a  .  amLODipine  10 mg Oral q morning - 10a  . aspirin  81 mg Oral Daily  . colchicine  0.6 mg Oral Daily  . docusate sodium  100 mg Oral BID  . ferrous sulfate  325 mg Oral TID PC  . furosemide  40 mg Intravenous BID  . gabapentin  300 mg Oral BID  . heparin  5,000 Units Subcutaneous 3 times per day  . insulin aspart  0-9 Units Subcutaneous TID WC  . metoprolol  25 mg Oral BID  . pantoprazole  40 mg Oral Daily  . polyethylene glycol  17 g Oral BID  . pravastatin  20 mg Oral q morning - 10a  . sodium chloride  3 mL Intravenous Q12H    Continuous Infusions:   Principal Problem:   Acute on chronic diastolic congestive heart failure Active Problems:   Hypertension   Chronic kidney disease, stage 3, cardiorenal syndrome   Diabetes mellitus   Edema   Acute congestive heart failure   HLD (hyperlipidemia)   Anxiety state   GERD (gastroesophageal reflux disease)   Heart block   CHF (congestive heart failure)    Time spent: 44mns    Sary Bogie  [facility] Pager [phone] If 7PM-7AM, please contact night-coverage at [email or web address], password [email or web address]7/15/2016, 11:11 AM  LOS: 3 days

## 2015-07-07 NOTE — Plan of Care (Signed)
Problem: Phase II Progression Outcomes Goal: Pain controlled Outcome: Completed/Met Date Met:  07/07/15 On schedule PRN medication that he takes at home.

## 2015-07-07 NOTE — Progress Notes (Signed)
SUBJECTIVE: Feeling better. No complaints. Happy about progress.     Intake/Output Summary (Last 24 hours) at 07/07/15 1024 Last data filed at 07/07/15 0700  Gross per 24 hour  Intake    720 ml  Output   1300 ml  Net   -580 ml    Current Facility-Administered Medications  Medication Dose Route Frequency Provider Last Rate Last Dose  . allopurinol (ZYLOPRIM) tablet 300 mg  300 mg Oral q morning - 10a Waldemar Dickens, MD   300 mg at 07/07/15 0947  . ALPRAZolam Duanne Moron) tablet 0.5 mg  0.5 mg Oral BID PRN Waldemar Dickens, MD   0.5 mg at 07/06/15 2141  . amLODipine (NORVASC) tablet 10 mg  10 mg Oral q morning - 10a Waldemar Dickens, MD   10 mg at 07/07/15 0946  . colchicine tablet 0.6 mg  0.6 mg Oral Daily Waldemar Dickens, MD   0.6 mg at 07/07/15 0946  . docusate sodium (COLACE) capsule 100 mg  100 mg Oral BID Waldemar Dickens, MD   100 mg at 07/07/15 0947  . ferrous sulfate tablet 325 mg  325 mg Oral TID PC Waldemar Dickens, MD   325 mg at 07/07/15 0946  . furosemide (LASIX) injection 40 mg  40 mg Intravenous BID Waldemar Dickens, MD   40 mg at 07/07/15 0946  . gabapentin (NEURONTIN) capsule 300 mg  300 mg Oral BID Waldemar Dickens, MD   300 mg at 07/07/15 0947  . heparin injection 5,000 Units  5,000 Units Subcutaneous 3 times per day Waldemar Dickens, MD   5,000 Units at 07/07/15 [phone]  . insulin aspart (novoLOG) injection 0-9 Units  0-9 Units Subcutaneous TID WC Waldemar Dickens, MD   1 Units at 07/06/15 1744  . metoprolol tartrate (LOPRESSOR) tablet 25 mg  25 mg Oral BID Arnoldo Lenis, MD   25 mg at 07/06/15 0924  . ondansetron (ZOFRAN) tablet 4 mg  4 mg Oral Q6H PRN Waldemar Dickens, MD       Or  . ondansetron [facility]) injection 4 mg  4 mg Intravenous Q6H PRN Waldemar Dickens, MD      . oxyCODONE-acetaminophen (PERCOCET/ROXICET) 5-325 MG per tablet 1-2 tablet  1-2 tablet Oral Q4H PRN Waldemar Dickens, MD   1 tablet at 07/07/15 [phone]  . pantoprazole (PROTONIX) EC tablet 40 mg  40 mg  Oral Daily Waldemar Dickens, MD   40 mg at 07/07/15 0946  . polyethylene glycol (MIRALAX / GLYCOLAX) packet 17 g  17 g Oral BID Waldemar Dickens, MD   17 g at 07/07/15 0947  . pravastatin (PRAVACHOL) tablet 20 mg  20 mg Oral q morning - 10a Waldemar Dickens, MD   20 mg at 07/07/15 0946  . sodium chloride 0.9 % injection 3 mL  3 mL Intravenous Q12H Waldemar Dickens, MD   3 mL at 07/07/15 0958    Filed Vitals:   07/06/15 1510 07/06/15 2138 07/07/15 0509 07/07/15 0946  BP: 112/47 129/83 118/64 133/45  Pulse: 62 94 80 55  Temp: 97.4 F (36.3 C) 98.2 F (36.8 C) 98.5 F (36.9 C)   TempSrc: Oral Oral Oral   Resp: 18 18 18    Height:      Weight:   299 lb 14.4 oz (136.034 kg)   SpO2: 100% 98% 100%     PHYSICAL EXAM General: NAD HEENT: Normal. Neck: No JVD,  no thyromegaly.  Lungs: Faint dry crackles at bases (chronic). No wheezes. CV: Nondisplaced PMI.  Regular rate and rhythm, normal S1/S2, no S3/S4, no murmur.  1+ pitting pretibial edema.   Abdomen: Soft, nontender, obese, no distention.  Neurologic: Alert and oriented x 3.  Psych: Normal affect. Musculoskeletal: No gross deformities. Extremities: No clubbing or cyanosis.     LABS: Basic Metabolic Panel:  Recent Labs  07/04/15 1932  07/06/15 0630 07/07/15 0651  NA 140  < > 144 143  K 4.1  < > 4.2 4.1  CL 103  < > 107 106  CO2 26  < > 28 28  GLUCOSE 105*  < > 110* 102*  BUN 54*  < > 57* 58*  CREATININE 2.22*  < > 2.04* 2.03*  CALCIUM 8.9  < > 8.7* 8.9  MG 1.7  --   --   --   < > = values in this interval not displayed. Liver Function Tests: No results for input(s): AST, ALT, ALKPHOS, BILITOT, PROT, ALBUMIN in the last 72 hours. No results for input(s): LIPASE, AMYLASE in the last 72 hours. CBC:  Recent Labs  07/04/15 1932  WBC 5.1  HGB 11.8*  HCT 36.5*  MCV 98.9  PLT 183   Cardiac Enzymes: No results for input(s): CKTOTAL, CKMB, CKMBINDEX, TROPONINI in the last 72 hours. BNP: Invalid input(s):  POCBNP D-Dimer: No results for input(s): DDIMER in the last 72 hours. Hemoglobin A1C:  Recent Labs  07/04/15 1932  HGBA1C 5.6   Fasting Lipid Panel: No results for input(s): CHOL, HDL, LDLCALC, TRIG, CHOLHDL, LDLDIRECT in the last 72 hours. Thyroid Function Tests:  Recent Labs  07/05/15 1559  TSH 1.725   Anemia Panel: No results for input(s): VITAMINB12, FOLATE, FERRITIN, TIBC, IRON, RETICCTPCT in the last 72 hours.  RADIOLOGY: Dg Chest 2 View  07/04/2015   CLINICAL DATA:  Fluid retention, diastolic heart failure, hypertension, diabetes, former smoker  EXAM: CHEST  2 VIEW  COMPARISON:  03/11/2015  FINDINGS: Stable mild cardiomegaly without CHF or edema. Minor streaky basilar atelectasis. No focal pneumonia, collapse or consolidation. Negative for effusion or pneumothorax. Trachea midline. Degenerative changes of the spine. Stable exam.  IMPRESSION: Cardiomegaly and basilar atelectasis.  No interval change.   Electronically Signed   By: Jerilynn Mages.  Shick M.D.   On: 07/04/2015 20:03      ASSESSMENT AND PLAN: 1. Acute on chronic diastolic heart failure: Weighed 311 lbs in office, 299 lbs today. Continue IV Lasix 40 mg bid, with expected transition to torsemide by Sunday.  2. CAD s/p PCI of LAD and D1: Stable ischemic heart disease. Will add aspirin 81 mg. Continue metoprolol 25 mg twice daily (dose recently reduced for bradycardia) and pravastatin 20 mg daily.  3. Essential HTN: Controlled on amlodipine 10 mg. No changes.  4. Hyperlipidemia: Lipids in 09/2014 showed TC 122, TG 121, HDL 31, LDL 67. Continue pravastatin 20 mg.     Kate Sable, M.D., F.A.C.C.

## 2015-07-08 LAB — BASIC METABOLIC PANEL
Anion gap: 12 (ref 5–15)
BUN: 60 mg/dL — ABNORMAL HIGH (ref 6–20)
CHLORIDE: 103 mmol/L (ref 101–111)
CO2: 26 mmol/L (ref 22–32)
Calcium: 9.1 mg/dL (ref 8.9–10.3)
Creatinine, Ser: 2.19 mg/dL — ABNORMAL HIGH (ref 0.61–1.24)
GFR calc non Af Amer: 29 mL/min — ABNORMAL LOW (ref >60)
GFR, EST AFRICAN AMERICAN: 33 mL/min — AB (ref >60)
Glucose, Bld: 133 mg/dL — ABNORMAL HIGH (ref 65–99)
POTASSIUM: 4.1 mmol/L (ref 3.5–5.1)
Sodium: 141 mmol/L (ref 135–145)

## 2015-07-08 LAB — GLUCOSE, CAPILLARY
GLUCOSE-CAPILLARY: 135 mg/dL — AB (ref 65–99)
GLUCOSE-CAPILLARY: 127 mg/dL — AB (ref 65–99)
Glucose-Capillary: 126 mg/dL — ABNORMAL HIGH (ref 65–99)
Glucose-Capillary: 121 mg/dL — ABNORMAL HIGH (ref 65–99)
Glucose-Capillary: 132 mg/dL — ABNORMAL HIGH (ref 65–99)

## 2015-07-08 MED ORDER — METOPROLOL TARTRATE 25 MG PO TABS
12.5000 mg | ORAL_TABLET | Freq: Two times a day (BID) | ORAL | Status: DC
  Administered 2015-07-08 – 2015-07-10 (×4): 12.5 mg via ORAL
  Filled 2015-07-08 (×4): qty 1

## 2015-07-08 MED ORDER — METOLAZONE 5 MG PO TABS
5.0000 mg | ORAL_TABLET | Freq: Once | ORAL | Status: AC
  Administered 2015-07-08: 5 mg via ORAL
  Filled 2015-07-08: qty 1

## 2015-07-08 NOTE — Plan of Care (Signed)
Problem: Phase II Progression Outcomes Goal: Walk in hall or up in chair TID Outcome: Completed/Met Date Met:  07/08/15 Patient is OOB ambulating in the hall he walked about 250 feet with minimal difficulty.

## 2015-07-08 NOTE — Plan of Care (Signed)
Problem: Phase II Progression Outcomes Goal: Dyspnea controlled with activity Outcome: Completed/Met Date Met:  07/08/15 Patient is up ad lib and does not require O2

## 2015-07-08 NOTE — Progress Notes (Signed)
Late Entry for 07/08/15 am  Notified Dr. Roderic Palau to discuss the fact that the patient has had low HR at times as low as 30's, but mainly in the 50's especially at night.  Voiced to him that the metoprolol 50 mg had been held day shift yesterday and during night due to the heart rate being lower than 60 at the time the med was due.  New orders given and followed.  This was also discussed with the patient, who verbalizes understanding.  I also added the patient's home TED/[employer] per patient's request.

## 2015-07-08 NOTE — Progress Notes (Signed)
[facility] PROGRESS NOTE  MICHAIL BOYTE QIH:[id number] DOB: 31-Mar-1946 DOA: 07/04/2015 PCP: Collene Mares, PA-C  Assessment/Plan: 1. Acute on chronic diastolic congestive heart failure. Patient admitted to the hospital after failing adjustment in his outpatient diuretics. He is on intravenous Lasix with fair urine output. Cardiology is following. His weight is down approximately 13 pounds since admission. Discussed the importance of fluid and salt restriction. The patient reports that he was drinking excessive amounts of water at home which is likely contributing to his volume overload. Would avoid ACE inhibitor due to renal dysfunction. Continue current treatments with diuresis. I suspect he will need a few more days of inpatient treatment. 2. Sinus bradycardia. Patient was having episodes of bradycardia overnight. Will decrease metoprolol to 12.5 mg twice a day. 3. CKD stage III. Creatinine appears to be near baseline. Continue to monitor in the setting of diuresis. 4. Hyperlipidemia. Continue statin 5. Diabetes. Continue sliding scale insulin and follow blood sugars. 6. Hypertension. Currently stable. Continue current treatments. 7. GERD. Continue proton pump inhibitors.  Code Status: full code Family Communication: discussed with patient Disposition Plan: discharge home once improved   Consultants:  cardiology  Procedures:    Antibiotics:    HPI/Subjective: Denies any shortness of breath. No chest pain. Feels that his legs are still "heavy".   Objective: Filed Vitals:   07/08/15 0528  BP: 139/61  Pulse: 61  Temp: 98 F (36.7 C)  Resp: 18    Intake/Output Summary (Last 24 hours) at 07/08/15 1328 Last data filed at 07/08/15 0900  Gross per 24 hour  Intake    708 ml  Output   1000 ml  Net   -292 ml   Filed Weights   07/06/15 0558 07/07/15 0509 07/08/15 0500  Weight: 137.803 kg (303 lb 12.8 oz) 136.034 kg (299 lb 14.4 oz) 136.351 kg (300 lb 9.6 oz)     Exam:   General:  NAD, laying in bed  Cardiovascular: s1, s2, regular rate  Respiratory: Clear to auscultation bilaterally  Abdomen: soft, nt, nd, bs+  Musculoskeletal: 1+ edema b/l   Data Reviewed: Basic Metabolic Panel:  Recent Labs Lab 07/04/15 1932 07/05/15 0701 07/06/15 0630 07/07/15 0651 07/08/15 0640  NA 140 141 144 143 141  K 4.1 3.9 4.2 4.1 4.1  CL 103 106 107 106 103  CO2 26 28 28 28 26   GLUCOSE 105* 116* 110* 102* 133*  BUN 54* 54* 57* 58* 60*  CREATININE 2.22* 2.01* 2.04* 2.03* 2.19*  CALCIUM 8.9 8.5* 8.7* 8.9 9.1  MG 1.7  --   --   --   --    Liver Function Tests: No results for input(s): AST, ALT, ALKPHOS, BILITOT, PROT, ALBUMIN in the last 168 hours. No results for input(s): LIPASE, AMYLASE in the last 168 hours. No results for input(s): AMMONIA in the last 168 hours. CBC:  Recent Labs Lab 07/04/15 1932  WBC 5.1  HGB 11.8*  HCT 36.5*  MCV 98.9  PLT 183   Cardiac Enzymes: No results for input(s): CKTOTAL, CKMB, CKMBINDEX, TROPONINI in the last 168 hours. BNP (last 3 results) No results for input(s): BNP in the last 8760 hours.  ProBNP (last 3 results) No results for input(s): PROBNP in the last 8760 hours.  CBG:  Recent Labs Lab 07/07/15 1137 07/07/15 1617 07/07/15 2140 07/08/15 0728 07/08/15 1141  GLUCAP 138* 137* 126* 121* 135*    No results found for this or any previous visit (from the past 240 hour(s)).  Studies: No results found.  Scheduled Meds: . allopurinol  300 mg Oral q morning - 10a  . amLODipine  10 mg Oral q morning - 10a  . aspirin  81 mg Oral Daily  . colchicine  0.6 mg Oral Daily  . docusate sodium  100 mg Oral BID  . ferrous sulfate  325 mg Oral TID PC  . furosemide  40 mg Intravenous BID  . gabapentin  300 mg Oral BID  . heparin  5,000 Units Subcutaneous 3 times per day  . insulin aspart  0-9 Units Subcutaneous TID WC  . metoprolol  25 mg Oral BID  . pantoprazole  40 mg Oral Daily  .  polyethylene glycol  17 g Oral BID  . pravastatin  20 mg Oral q morning - 10a  . sodium chloride  3 mL Intravenous Q12H   Continuous Infusions:   Principal Problem:   Acute on chronic diastolic congestive heart failure Active Problems:   Hypertension   Chronic kidney disease, stage 3, cardiorenal syndrome   Diabetes mellitus   Edema   Acute congestive heart failure   HLD (hyperlipidemia)   Anxiety state   GERD (gastroesophageal reflux disease)   Heart block   CHF (congestive heart failure)    Time spent: 23mns    MEMON,JEHANZEB  [facility] Pager [phone] If 7PM-7AM, please contact night-coverage at [email or web address], password [email or web address]7/16/2016, 1:28 PM  LOS: 4 days

## 2015-07-09 LAB — GLUCOSE, CAPILLARY
GLUCOSE-CAPILLARY: 145 mg/dL — AB (ref 65–99)
Glucose-Capillary: 205 mg/dL — ABNORMAL HIGH (ref 65–99)
Glucose-Capillary: 103 mg/dL — ABNORMAL HIGH (ref 65–99)
Glucose-Capillary: 135 mg/dL — ABNORMAL HIGH (ref 65–99)

## 2015-07-09 LAB — BASIC METABOLIC PANEL
Anion gap: 10 (ref 5–15)
BUN: 62 mg/dL — ABNORMAL HIGH (ref 6–20)
CO2: 28 mmol/L (ref 22–32)
Calcium: 9 mg/dL (ref 8.9–10.3)
Chloride: 102 mmol/L (ref 101–111)
Creatinine, Ser: 2.19 mg/dL — ABNORMAL HIGH (ref 0.61–1.24)
GFR calc Af Amer: 33 mL/min — ABNORMAL LOW (ref >60)
GFR, EST NON AFRICAN AMERICAN: 29 mL/min — AB (ref >60)
Glucose, Bld: 137 mg/dL — ABNORMAL HIGH (ref 65–99)
Potassium: 4.2 mmol/L (ref 3.5–5.1)
SODIUM: 140 mmol/L (ref 135–145)

## 2015-07-09 MED ORDER — METOLAZONE 5 MG PO TABS
5.0000 mg | ORAL_TABLET | Freq: Once | ORAL | Status: AC
  Administered 2015-07-09: 5 mg via ORAL
  Filled 2015-07-09: qty 1

## 2015-07-09 NOTE — Progress Notes (Signed)
[facility] PROGRESS NOTE  [name] ZWC:[id number] DOB: 03/01/1946 DOA: 07/04/2015 PCP: Collene Mares, PA-C  Assessment/Plan: 1. Acute on chronic diastolic congestive heart failure. Patient admitted to the hospital after failing adjustment in his outpatient diuretics. He is on intravenous Lasix with fair urine output. Cardiology is following. His weight is down approximately 16 pounds since admission. Discussed the importance of fluid and salt restriction. The patient reports that he was drinking excessive amounts of water at home which is likely contributing to his volume overload. Would avoid ACE inhibitor due to renal dysfunction. Continue current treatments with diuresis. Will give one dose of metolazone to assist with diuresis. 2. Sinus bradycardia. metoprolol dose decreased to 12.5 mg twice a day. 3. CKD stage III. Creatinine appears to be near baseline. Continue to monitor in the setting of diuresis. 4. Hyperlipidemia. Continue statin 5. Diabetes. Continue sliding scale insulin and follow blood sugars. 6. Hypertension. Currently stable. Continue current treatments. 7. GERD. Continue proton pump inhibitors.  Code Status: full code Family Communication: discussed with patient Disposition Plan: discharge home once improved   Consultants:  cardiology  Procedures:    Antibiotics:    HPI/Subjective: No chest pain or shortness of breath. Knees feel like "jelly" when he walks   Objective: Filed Vitals:   07/09/15 1354  BP: 129/74  Pulse: 54  Temp: 98.2 F (36.8 C)  Resp: 18    Intake/Output Summary (Last 24 hours) at 07/09/15 1453 Last data filed at 07/09/15 0445  Gross per 24 hour  Intake    660 ml  Output   1900 ml  Net  -1240 ml   Filed Weights   07/07/15 0509 07/08/15 0500 07/09/15 0436  Weight: 136.034 kg (299 lb 14.4 oz) 136.351 kg (300 lb 9.6 oz) 134.673 kg (296 lb 14.4 oz)    Exam:   General:  NAD, laying in bed  Cardiovascular: s1,  s2, RRR  Respiratory: CTA B  Abdomen: soft, nt, nd, bs+  Musculoskeletal: 1+ edema b/l   Data Reviewed: Basic Metabolic Panel:  Recent Labs Lab 07/04/15 1932 07/05/15 0701 07/06/15 0630 07/07/15 0651 07/08/15 0640 07/09/15 0620  NA 140 141 144 143 141 140  K 4.1 3.9 4.2 4.1 4.1 4.2  CL 103 106 107 106 103 102  CO2 26 28 28 28 26 28   GLUCOSE 105* 116* 110* 102* 133* 137*  BUN 54* 54* 57* 58* 60* 62*  CREATININE 2.22* 2.01* 2.04* 2.03* 2.19* 2.19*  CALCIUM 8.9 8.5* 8.7* 8.9 9.1 9.0  MG 1.7  --   --   --   --   --    Liver Function Tests: No results for input(s): AST, ALT, ALKPHOS, BILITOT, PROT, ALBUMIN in the last 168 hours. No results for input(s): LIPASE, AMYLASE in the last 168 hours. No results for input(s): AMMONIA in the last 168 hours. CBC:  Recent Labs Lab 07/04/15 1932  WBC 5.1  HGB 11.8*  HCT 36.5*  MCV 98.9  PLT 183   Cardiac Enzymes: No results for input(s): CKTOTAL, CKMB, CKMBINDEX, TROPONINI in the last 168 hours. BNP (last 3 results) No results for input(s): BNP in the last 8760 hours.  ProBNP (last 3 results) No results for input(s): PROBNP in the last 8760 hours.  CBG:  Recent Labs Lab 07/08/15 1141 07/08/15 1604 07/08/15 2055 07/09/15 0738 07/09/15 1153  GLUCAP 135* 132* 127* 145* 205*    No results found for this or any previous visit (from the past 240 hour(s)).   Studies:  No results found.  Scheduled Meds: . allopurinol  300 mg Oral q morning - 10a  . amLODipine  10 mg Oral q morning - 10a  . aspirin  81 mg Oral Daily  . colchicine  0.6 mg Oral Daily  . docusate sodium  100 mg Oral BID  . ferrous sulfate  325 mg Oral TID PC  . furosemide  40 mg Intravenous BID  . gabapentin  300 mg Oral BID  . heparin  5,000 Units Subcutaneous 3 times per day  . insulin aspart  0-9 Units Subcutaneous TID WC  . metolazone  5 mg Oral Once  . metoprolol  12.5 mg Oral BID  . pantoprazole  40 mg Oral Daily  . polyethylene glycol  17 g  Oral BID  . pravastatin  20 mg Oral q morning - 10a  . sodium chloride  3 mL Intravenous Q12H   Continuous Infusions:   Principal Problem:   Acute on chronic diastolic congestive heart failure Active Problems:   Hypertension   Chronic kidney disease, stage 3, cardiorenal syndrome   Diabetes mellitus   Edema   Acute congestive heart failure   HLD (hyperlipidemia)   Anxiety state   GERD (gastroesophageal reflux disease)   Heart block   CHF (congestive heart failure)    Time spent: 34mns    MEMON,JEHANZEB  [facility] Pager [phone] If 7PM-7AM, please contact night-coverage at [email or web address], password [email or web address]7/17/2016, 2:53 PM  LOS: 5 days

## 2015-07-10 LAB — GLUCOSE, CAPILLARY
GLUCOSE-CAPILLARY: 124 mg/dL — AB (ref 65–99)
GLUCOSE-CAPILLARY: 182 mg/dL — AB (ref 65–99)

## 2015-07-10 LAB — BASIC METABOLIC PANEL
Anion gap: 14 (ref 5–15)
BUN: 69 mg/dL — AB (ref 6–20)
CO2: 27 mmol/L (ref 22–32)
Calcium: 9.1 mg/dL (ref 8.9–10.3)
Chloride: 97 mmol/L — ABNORMAL LOW (ref 101–111)
Creatinine, Ser: 2.5 mg/dL — ABNORMAL HIGH (ref 0.61–1.24)
GFR calc non Af Amer: 25 mL/min — ABNORMAL LOW (ref >60)
GFR, EST AFRICAN AMERICAN: 28 mL/min — AB (ref >60)
GLUCOSE: 134 mg/dL — AB (ref 65–99)
Potassium: 4.3 mmol/L (ref 3.5–5.1)
Sodium: 138 mmol/L (ref 135–145)

## 2015-07-10 MED ORDER — METOPROLOL TARTRATE 50 MG PO TABS: 25.0000 mg | ORAL_TABLET | Freq: Two times a day (BID) | ORAL | Status: DC

## 2015-07-10 MED ORDER — TORSEMIDE 20 MG PO TABS: 40.0000 mg | ORAL_TABLET | Freq: Two times a day (BID) | ORAL | Status: DC

## 2015-07-10 NOTE — Progress Notes (Signed)
Patient discharged with instructions, prescription, and care notes.  Verbalized understanding via teach back.  IV was removed and the site was WNL. Patient voiced no further complaints or concerns at the time of discharge.  Appointments scheduled per instructions.  Patient left the floor via w/c with staff and family in stable condition.   Late entry

## 2015-07-10 NOTE — Care Management Note (Signed)
Case Management Note  Patient Details  Name: PERCELL LAMBOY MRN: [id number] Date of Birth: 12/12/1946  Subjective/Objective:                    Action/Plan:   Expected Discharge Date:                  Expected Discharge Plan:  Home/Self Care  In-House Referral:  NA  Discharge planning Services  CM Consult  Post Acute Care Choice:  Home Health Choice offered to:  Patient  DME Arranged:    DME Agency:     HH Arranged:  RN [place] Agency:  [place]  Status of Service:  Completed, signed off  Medicare Important Message Given:  Yes-third notification given Date Medicare IM Given:    Medicare IM give by:    Date Additional Medicare IM Given:    Additional Medicare Important Message give by:     If discussed at [place] of Stay Meetings, dates discussed:    Additional Comments: Pt discharged home today with [facility] RN (per pts choice). Romualdo Bolk of [facility] is aware and will collect the pts information from the chart. [place] [facility] services to start within 48 hours of discharge. Pt given set of scales and pt instructed to weigh daily and record for the [facility] RN. Pt verbalized understanding. Pt also verbalized understanding of fluid restrictions. Pt refuses any [facility] involvement at this time. Pt and pts nurse aware of discharge arrangements. Christinia Gully Edie, RN 07/10/2015, 2:23 PM

## 2015-07-10 NOTE — Discharge Summary (Signed)
Physician Discharge Summary  [name] ZOX:[id number] DOB: [birth year] DOA: 07/04/2015  PCP: Collene Mares, PA-C  Admit date: 07/04/2015 Discharge date: 07/10/2015  Time spent: 40 minutes  Recommendations for Outpatient Follow-up:  1. Follow-up with cardiology on 7/25. He should have a repeat basic metabolic panel checked at that time  Discharge Diagnoses:  Principal Problem:   Acute on chronic diastolic congestive heart failure Active Problems:   Hypertension   Chronic kidney disease, stage 3, cardiorenal syndrome   Diabetes mellitus   Edema   Acute congestive heart failure   HLD (hyperlipidemia)   Anxiety state   GERD (gastroesophageal reflux disease)   Heart block   CHF (congestive heart failure)   Discharge Condition: Improving  Diet recommendation: Low-salt, low carb  Filed Weights   07/08/15 0500 07/09/15 0436 07/10/15 0508  Weight: 136.351 kg (300 lb 9.6 oz) 134.673 kg (296 lb 14.4 oz) 133.6 kg (294 lb 8.6 oz)    History of present illness:  This patient was admitted to the hospital with worsening lower extremity edema, weight gain related to congestive heart failure. He has been gaining weight for several months now. Lower extremity Dopplers were done which were negative for DVT. His torsemide was increased to 20 mg twice daily without significant improvement.  Hospital Course:  Patient was admitted to the hospital and started on intravenous Lasix for acute on chronic diastolic congestive heart failure. Echocardiogram was done that showed intact ejection fraction with grade 2 diastolic dysfunction.Marland Kitchen He was seen by cardiology during his hospital stay. He has lost approximately 15 pounds during his hospital stay. His discharge weight is 294 pounds. His creatinine has started to trend up. The patient has chronic kidney disease stage stage III, with baseline creatinine approximately 2. His creatinine has now trended up to 2.5. He has been transitioned to oral Demadex.  He does not have any shortness of breath or evidence of pulmonary edema. He is able to ambulate on room air without difficulty. His lower extremity edema, although still present has improved. He has been counseled on the importance of fluid-reduction and salt restriction. Further adjustment of his diuretics can be done as an outpatient. Patient was set up with home health RN for further CHF education. . Would avoid ACE inhibitor due to renal dysfunction. The patient is otherwise stable for discharge home today.  Procedures:    Consultations:  Cardiology  Discharge Exam: Filed Vitals:   07/10/15 0508  BP: 151/65  Pulse: 62  Temp: 98.1 F (36.7 C)  Resp: 18    General: No acute distress Cardiovascular: S1, S2, regular rate and rhythm Respiratory: Clear to auscultation bilaterally  Discharge Instructions   Discharge Instructions    (HEART FAILURE PATIENTS) Call MD:  Anytime you have any of the following symptoms: 1) 3 pound weight gain in 24 hours or 5 pounds in 1 week 2) shortness of breath, with or without a dry hacking cough 3) swelling in the hands, feet or stomach 4) if you have to sleep on extra pillows at night in order to breathe.    Complete by:  As directed      Diet - low sodium heart healthy    Complete by:  As directed      Face-to-face encounter (required for Medicare/Medicaid patients)    Complete by:  As directed   I MEMON,JEHANZEB certify that this patient is under my care and that I, or a nurse practitioner or physician's assistant working with me, had a face-to-face encounter  that meets the physician face-to-face encounter requirements with this patient on 07/10/2015. The encounter with the patient was in whole, or in part for the following medical condition(s) which is the primary reason for home health care (List medical condition): chf exacerbation  The encounter with the patient was in whole, or in part, for the following medical condition, which is the primary  reason for home health care:  congestive heart failure  I certify that, based on my findings, the following services are medically necessary home health services:  Nursing  Reason for Medically Necessary Home Health Services:  Skilled Nursing- Skilled Assessment/Observation  My clinical findings support the need for the above services:  Unable to leave home safely without assistance and/or assistive device  Further, I certify that my clinical findings support that this patient is homebound due to:  Unable to leave home safely without assistance     Home Health    Complete by:  As directed   To provide the following care/treatments:  RN     Increase activity slowly    Complete by:  As directed           Discharge Medication List as of 07/10/2015  4:02 PM    CONTINUE these medications which have CHANGED   Details  metoprolol (LOPRESSOR) 50 MG tablet Take 0.5 tablets (25 mg total) by mouth 2 (two) times daily., Starting 07/10/2015, Until Discontinued, Print    torsemide (DEMADEX) 20 MG tablet Take 2 tablets (40 mg total) by mouth 2 (two) times daily. Start on 7/19, Starting 07/10/2015, Until Discontinued, Print      CONTINUE these medications which have NOT CHANGED   Details  acetaminophen (TYLENOL) 325 MG tablet Take 1-2 tablets (325-650 mg total) by mouth every 6 (six) hours as needed., Starting 11/17/2013, Until Discontinued, No Print    allopurinol (ZYLOPRIM) 300 MG tablet Take 300 mg by mouth every morning. , Until Discontinued, Historical Med    ALPRAZolam (XANAX) 0.5 MG tablet Take one tablet by mouth twice daily as needed for anxiety, Print    amLODipine (NORVASC) 10 MG tablet Take 10 mg by mouth every morning. , Until Discontinued, Historical Med    aspirin EC 81 MG tablet Take 81 mg by mouth daily., Until Discontinued, Historical Med    COLCRYS 0.6 MG tablet Take 0.6 mg by mouth daily. , Starting 12/31/2013, Until Discontinued, Historical Med    ferrous sulfate 325 (65 FE) MG  tablet Take 1 tablet (325 mg total) by mouth 3 (three) times daily after meals., Starting 03/09/2015, Until Discontinued, No Print    gabapentin (NEURONTIN) 300 MG capsule Take 300 mg by mouth 2 (two) times daily. , Until Discontinued, Historical Med    HYDROcodone-acetaminophen ([place]) 10-325 MG per tablet Take 1 tablet by mouth 5 (five) times daily as needed for moderate pain or severe pain., Until Discontinued, Historical Med    NITROSTAT 0.4 MG SL tablet DISSOLVE ONE TABLET UNDER THE TONGUE AS NEEDED FOR CHEST PAIN., Normal    omeprazole (PRILOSEC) 20 MG capsule Take 20 mg by mouth every morning. , Starting 01/27/2014, Until Discontinued, Historical Med    pravastatin (PRAVACHOL) 20 MG tablet Take 20 mg by mouth every morning. , Until Discontinued, Historical Med    saxagliptin HCl (ONGLYZA) 2.5 MG TABS tablet Take 2.5 mg by mouth every morning., Until Discontinued, Historical Med      STOP taking these medications     docusate sodium (COLACE) 100 MG capsule  oxyCODONE (OXY IR/ROXICODONE) 5 MG immediate release tablet      oxyCODONE-acetaminophen (PERCOCET/ROXICET) 5-325 MG per tablet      polyethylene glycol (MIRALAX / GLYCOLAX) packet        Allergies  Allergen Reactions  . Niacin And Related Other (See Comments)    Reaction: flushing and burning side effects even with extended release   Follow-up Information    Follow up with [place] [facility].   Contact information:   [address] [place] [address] [phone]       Follow up with Jory Sims, NP On 07/17/2015.   Specialties:  Nurse Practitioner, Radiology, Cardiology   Why:  at 2:30pm   Contact information:   [place] [address] [phone]        The results of significant diagnostics from this hospitalization (including imaging, microbiology, ancillary and laboratory) are listed below for reference.    Significant Diagnostic Studies: Dg Chest 2  View  07/04/2015   CLINICAL DATA:  Fluid retention, diastolic heart failure, hypertension, diabetes, former smoker  EXAM: CHEST  2 VIEW  COMPARISON:  03/11/2015  FINDINGS: Stable mild cardiomegaly without CHF or edema. Minor streaky basilar atelectasis. No focal pneumonia, collapse or consolidation. Negative for effusion or pneumothorax. Trachea midline. Degenerative changes of the spine. Stable exam.  IMPRESSION: Cardiomegaly and basilar atelectasis.  No interval change.   Electronically Signed   By: Jerilynn Mages.  Shick M.D.   On: 07/04/2015 20:03    Microbiology: No results found for this or any previous visit (from the past 240 hour(s)).   Labs: Basic Metabolic Panel:  Recent Labs Lab 07/04/15 1932  07/06/15 0630 07/07/15 0651 07/08/15 0640 07/09/15 0620 07/10/15 0647  NA 140  < > 144 143 141 140 138  K 4.1  < > 4.2 4.1 4.1 4.2 4.3  CL 103  < > 107 106 103 102 97*  CO2 26  < > 28 28 26 28 27   GLUCOSE 105*  < > 110* 102* 133* 137* 134*  BUN 54*  < > 57* 58* 60* 62* 69*  CREATININE 2.22*  < > 2.04* 2.03* 2.19* 2.19* 2.50*  CALCIUM 8.9  < > 8.7* 8.9 9.1 9.0 9.1  MG 1.7  --   --   --   --   --   --   < > = values in this interval not displayed. Liver Function Tests: No results for input(s): AST, ALT, ALKPHOS, BILITOT, PROT, ALBUMIN in the last 168 hours. No results for input(s): LIPASE, AMYLASE in the last 168 hours. No results for input(s): AMMONIA in the last 168 hours. CBC:  Recent Labs Lab 07/04/15 1932  WBC 5.1  HGB 11.8*  HCT 36.5*  MCV 98.9  PLT 183   Cardiac Enzymes: No results for input(s): CKTOTAL, CKMB, CKMBINDEX, TROPONINI in the last 168 hours. BNP: BNP (last 3 results) No results for input(s): BNP in the last 8760 hours.  ProBNP (last 3 results) No results for input(s): PROBNP in the last 8760 hours.  CBG:  Recent Labs Lab 07/09/15 1153 07/09/15 1643 07/09/15 2144 07/10/15 0756 07/10/15 1151  GLUCAP 205* 103* 135* 124* 182*        Signed:  MEMON,JEHANZEB  [facility] 07/10/2015, 7:26 PM

## 2015-07-10 NOTE — Care Management Important Message (Signed)
Important Message  Patient Details  Name: [name] MRN: [id number] Date of Birth: 12-23-1946   Medicare Important Message Given:  Yes-third notification given    Joylene Draft, RN 07/10/2015, 2:23 PM

## 2015-07-12 DIAGNOSIS — I129 Hypertensive chronic kidney disease with stage 1 through stage 4 chronic kidney disease, or unspecified chronic kidney disease: Secondary | ICD-10-CM | POA: Diagnosis not present

## 2015-07-12 DIAGNOSIS — E785 Hyperlipidemia, unspecified: Secondary | ICD-10-CM | POA: Diagnosis not present

## 2015-07-12 DIAGNOSIS — F419 Anxiety disorder, unspecified: Secondary | ICD-10-CM | POA: Diagnosis not present

## 2015-07-12 DIAGNOSIS — I5030 Unspecified diastolic (congestive) heart failure: Secondary | ICD-10-CM | POA: Diagnosis not present

## 2015-07-12 DIAGNOSIS — E1129 Type 2 diabetes mellitus with other diabetic kidney complication: Secondary | ICD-10-CM | POA: Diagnosis not present

## 2015-07-12 DIAGNOSIS — Z1389 Encounter for screening for other disorder: Secondary | ICD-10-CM | POA: Diagnosis not present

## 2015-07-12 DIAGNOSIS — I5033 Acute on chronic diastolic (congestive) heart failure: Secondary | ICD-10-CM | POA: Diagnosis not present

## 2015-07-12 DIAGNOSIS — K219 Gastro-esophageal reflux disease without esophagitis: Secondary | ICD-10-CM | POA: Diagnosis not present

## 2015-07-12 DIAGNOSIS — N183 Chronic kidney disease, stage 3 (moderate): Secondary | ICD-10-CM | POA: Diagnosis not present

## 2015-07-12 DIAGNOSIS — Z6837 Body mass index (BMI) 37.0-37.9, adult: Secondary | ICD-10-CM | POA: Diagnosis not present

## 2015-07-12 DIAGNOSIS — R600 Localized edema: Secondary | ICD-10-CM | POA: Diagnosis not present

## 2015-07-14 DIAGNOSIS — E785 Hyperlipidemia, unspecified: Secondary | ICD-10-CM | POA: Diagnosis not present

## 2015-07-14 DIAGNOSIS — K219 Gastro-esophageal reflux disease without esophagitis: Secondary | ICD-10-CM | POA: Diagnosis not present

## 2015-07-14 DIAGNOSIS — I5033 Acute on chronic diastolic (congestive) heart failure: Secondary | ICD-10-CM | POA: Diagnosis not present

## 2015-07-14 DIAGNOSIS — F419 Anxiety disorder, unspecified: Secondary | ICD-10-CM | POA: Diagnosis not present

## 2015-07-14 DIAGNOSIS — I129 Hypertensive chronic kidney disease with stage 1 through stage 4 chronic kidney disease, or unspecified chronic kidney disease: Secondary | ICD-10-CM | POA: Diagnosis not present

## 2015-07-14 DIAGNOSIS — N183 Chronic kidney disease, stage 3 (moderate): Secondary | ICD-10-CM | POA: Diagnosis not present

## 2015-07-14 DIAGNOSIS — E1129 Type 2 diabetes mellitus with other diabetic kidney complication: Secondary | ICD-10-CM | POA: Diagnosis not present

## 2015-07-17 ENCOUNTER — Other Ambulatory Visit (HOSPITAL_COMMUNITY)
Admission: RE | Admit: 2015-07-17 | Discharge: 2015-07-17 | Disposition: A | Discharge status: Home / Self Care | Payer: Medicare Other | Source: Ambulatory Visit | Attending: Cardiovascular Disease | Admitting: Cardiovascular Disease

## 2015-07-17 ENCOUNTER — Ambulatory Visit (INDEPENDENT_AMBULATORY_CARE_PROVIDER_SITE_OTHER): Payer: Medicare Other | Admitting: Adult Health

## 2015-07-17 ENCOUNTER — Encounter: Payer: Self-pay | Admitting: Adult Health

## 2015-07-17 VITALS — BP 110/62 | HR 66 | Ht 75.0 in | Wt 296.0 lb

## 2015-07-17 DIAGNOSIS — I10 Essential (primary) hypertension: Secondary | ICD-10-CM

## 2015-07-17 DIAGNOSIS — I5032 Chronic diastolic (congestive) heart failure: Secondary | ICD-10-CM | POA: Diagnosis not present

## 2015-07-17 LAB — BASIC METABOLIC PANEL
Anion gap: 13 (ref 5–15)
BUN: 111 mg/dL — ABNORMAL HIGH (ref 6–20)
CALCIUM: 9.4 mg/dL (ref 8.9–10.3)
CO2: 26 mmol/L (ref 22–32)
CREATININE: 3.58 mg/dL — AB (ref 0.61–1.24)
Chloride: 100 mmol/L — ABNORMAL LOW (ref 101–111)
GFR calc non Af Amer: 16 mL/min — ABNORMAL LOW (ref >60)
GFR, EST AFRICAN AMERICAN: 18 mL/min — AB (ref >60)
GLUCOSE: 128 mg/dL — AB (ref 65–99)
Potassium: 4.6 mmol/L (ref 3.5–5.1)
Sodium: 139 mmol/L (ref 135–145)

## 2015-07-17 MED ORDER — AMLODIPINE BESYLATE 5 MG PO TABS: 5.0000 mg | ORAL_TABLET | Freq: Every day | ORAL | Status: DC

## 2015-07-17 NOTE — Progress Notes (Deleted)
Name: [name]    DOB: 1946-01-17  Age: 69 y.o.  MR#: [id number]       PCP:  Collene Mares, PA-C      Insurance: Payor: [name] MEDICARE / Plan: [employer] MEDICARE / Product Type: *No Product type* /   CC:    Chief Complaint  Patient presents with  . Hypertension  . Coronary Artery Disease    TES to the LAD and diagonal 1, with residual nonobstructive disease in RCA and circumflex    VS Filed Vitals:   07/17/15 1444  BP: 110/62  Pulse: 66  Height: 6' 3"  (1.905 m)  Weight: 296 lb (134.265 kg)  SpO2: 97%    Weights Current Weight  07/17/15 296 lb (134.265 kg)  07/10/15 294 lb 8.6 oz (133.6 kg)  07/04/15 311 lb 12.8 oz (141.432 kg)    Blood Pressure  BP Readings from Last 3 Encounters:  07/17/15 110/62  07/10/15 151/65  07/04/15 140/82     Admit date:  (Not on file) Last encounter with RMR:  Visit date not found   Allergy Niacin and related  Current Outpatient Prescriptions  Medication Sig Dispense Refill  . acetaminophen (TYLENOL) 325 MG tablet Take 1-2 tablets (325-650 mg total) by mouth every 6 (six) hours as needed. (Patient taking differently: Take 325-650 mg by mouth every 6 (six) hours as needed for moderate pain or headache. )    . allopurinol (ZYLOPRIM) 300 MG tablet Take 300 mg by mouth every morning.     Marland Kitchen ALPRAZolam (XANAX) 0.5 MG tablet Take one tablet by mouth twice daily as needed for anxiety (Patient taking differently: Take 0.5 mg by mouth 2 (two) times daily as needed for anxiety. ) 60 tablet 5  . amLODipine (NORVASC) 10 MG tablet Take 10 mg by mouth every morning.     Marland Kitchen aspirin EC 81 MG tablet Take 81 mg by mouth daily.    Marland Kitchen COLCRYS 0.6 MG tablet Take 0.6 mg by mouth daily.     . ferrous sulfate 325 (65 FE) MG tablet Take 1 tablet (325 mg total) by mouth 3 (three) times daily after meals.  3  . gabapentin (NEURONTIN) 300 MG capsule Take 300 mg by mouth 2 (two) times daily.     Marland Kitchen HYDROcodone-acetaminophen (NORCO) 10-325 MG per tablet Take 1  tablet by mouth 5 (five) times daily as needed for moderate pain or severe pain.    . metoprolol (LOPRESSOR) 50 MG tablet Take 0.5 tablets (25 mg total) by mouth 2 (two) times daily. 30 tablet 0  . NITROSTAT 0.4 MG SL tablet DISSOLVE ONE TABLET UNDER THE TONGUE AS NEEDED FOR CHEST PAIN. 25 tablet 1  . omeprazole (PRILOSEC) 20 MG capsule Take 20 mg by mouth every morning.     . pravastatin (PRAVACHOL) 20 MG tablet Take 20 mg by mouth every morning.     . saxagliptin HCl (ONGLYZA) 2.5 MG TABS tablet Take 2.5 mg by mouth every morning.    . torsemide (DEMADEX) 20 MG tablet Take 2 tablets (40 mg total) by mouth 2 (two) times daily. Start on 7/19 60 tablet 1   No current facility-administered medications for this visit.    Discontinued Meds:   There are no discontinued medications.  Patient Active Problem List   Diagnosis Date Noted  . CHF (congestive heart failure) 07/05/2015  . Acute congestive heart failure 07/04/2015  . Acute on chronic diastolic congestive heart failure 07/04/2015  . HLD (hyperlipidemia) 07/04/2015  . Anxiety  state 07/04/2015  . GERD (gastroesophageal reflux disease) 07/04/2015  . Heart block 07/04/2015  . Edema 03/11/2015  . Diastolic CHF, chronic 37/03/8888  . 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 Muscle weakness (generalized) 01/13/2014  . Knee stiffness 01/13/2014  . Gout 12/11/2013  . Cellulitis 11/19/2013  . Expected blood loss anemia 11/17/2013  . Obese 11/17/2013  . S/P right TKA 11/15/2013  . Anemia, normocytic normochromic 01/12/2013  . Diabetes mellitus 12/10/2012  . 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 Overweight(278.02) 11/17/2012  . Obstructive sleep apnea 11/15/2012  . Chronic kidney disease, stage 3, cardiorenal syndrome 08/23/2011  . Hypertension     LABS    Component Value Date/Time   NA 138 07/10/2015 0647   NA 140 07/09/2015 0620   NA 141 07/08/2015 0640   K 4.3 07/10/2015 0647   K 4.2 07/09/2015 0620   K 4.1  07/08/2015 0640   CL 97* 07/10/2015 0647   CL 102 07/09/2015 0620   CL 103 07/08/2015 0640   CO2 27 07/10/2015 0647   CO2 28 07/09/2015 0620   CO2 26 07/08/2015 0640   GLUCOSE 134* 07/10/2015 0647   GLUCOSE 137* 07/09/2015 0620   GLUCOSE 133* 07/08/2015 0640   BUN 69* 07/10/2015 0647   BUN 62* 07/09/2015 0620   BUN 60* 07/08/2015 0640   CREATININE 2.50* 07/10/2015 0647   CREATININE 2.19* 07/09/2015 0620   CREATININE 2.19* 07/08/2015 0640   CREATININE 1.48* 01/07/2013 1545   CREATININE 2.69* 11/24/2012 1442   CALCIUM 9.1 07/10/2015 0647   CALCIUM 9.0 07/09/2015 0620   CALCIUM 9.1 07/08/2015 0640   GFRNONAA 25* 07/10/2015 0647   GFRNONAA 29* 07/09/2015 0620   GFRNONAA 29* 07/08/2015 0640   GFRAA 28* 07/10/2015 0647   GFRAA 33* 07/09/2015 0620   GFRAA 33* 07/08/2015 0640   CMP     Component Value Date/Time   NA 138 07/10/2015 0647   K 4.3 07/10/2015 0647   CL 97* 07/10/2015 0647   CO2 27 07/10/2015 0647   GLUCOSE 134* 07/10/2015 0647   BUN 69* 07/10/2015 0647   CREATININE 2.50* 07/10/2015 0647   CREATININE 1.48* 01/07/2013 1545   CALCIUM 9.1 07/10/2015 0647   PROT 6.5 11/20/2013 0601   ALBUMIN 2.2* 11/20/2013 0601   AST 13 11/20/2013 0601   ALT 7 11/20/2013 0601   ALKPHOS 59 11/20/2013 0601   BILITOT 0.8 11/20/2013 0601   GFRNONAA 25* 07/10/2015 0647   GFRAA 28* 07/10/2015 0647       Component Value Date/Time   WBC 5.1 07/04/2015 1932   WBC 5.9 03/22/2015 0735   WBC 6.2 03/15/2015 0725   HGB 11.8* 07/04/2015 1932   HGB 8.1* 03/22/2015 0735   HGB 8.1* 03/15/2015 0725   HCT 36.5* 07/04/2015 1932   HCT 25.3* 03/22/2015 0735   HCT 25.0* 03/15/2015 0725   MCV 98.9 07/04/2015 1932   MCV 100.8* 03/22/2015 0735   MCV 101.2* 03/15/2015 0725    Lipid Panel     Component Value Date/Time   CHOL 121 11/15/2012 0625   TRIG 57 11/15/2012 0625   HDL 36* 11/15/2012 0625   CHOLHDL 3.4 11/15/2012 0625   VLDL 11 11/15/2012 0625   LDLCALC 74 11/15/2012 0625     ABG No results found for: PHART, PCO2ART, PO2ART, HCO3, TCO2, ACIDBASEDEF, O2SAT   Lab Results  Component Value Date   TSH 1.725 07/05/2015   BNP (last 3 results) No results for input(s): BNP in the last 8760 hours.  ProBNP (last  3 results) No results for input(s): PROBNP in the last 8760 hours.  Cardiac Panel (last 3 results) No results for input(s): CKTOTAL, CKMB, TROPONINI, RELINDX in the last 72 hours.  Iron/TIBC/Ferritin/ %Sat    Component Value Date/Time   IRON 13* 11/20/2013 1038   TIBC 124* 11/20/2013 1038   FERRITIN 809* 11/22/2013 1219   IRONPCTSAT 10* 11/20/2013 1038     EKG Orders placed or performed during the hospital encounter of 07/04/15  . 12 lead EKG  . 12 lead EKG  . EKG 12-Lead  . EKG 12-Lead     Prior Assessment and Plan Problem List as of 07/17/2015      Cardiovascular and Mediastinum   Hypertension   Last Assessment & Plan 06/15/2013 Office Visit Edited 06/17/2013 10:52 PM by Yehuda Savannah, MD    Blood pressure control is good with current medication, which will be continued.      Arteriosclerotic cardiovascular disease (ASCVD)   Last Assessment & Plan 06/15/2013 Office Visit Written 06/15/2013  3:14 PM by Yehuda Savannah, MD    Mr. Grime has been stable from a cardiac standpoint since intervention for an acute myocardial infarction 7 months ago. He is willing to wait to complete a a one-year course of dual antiplatelet therapy prior to undergoing TKA.      Diastolic CHF, chronic   Acute congestive heart failure   Acute on chronic diastolic congestive heart failure   Heart block   CHF (congestive heart failure)     Respiratory   Obstructive sleep apnea     Digestive   GERD (gastroesophageal reflux disease)     Endocrine   Diabetes mellitus   Last Assessment & Plan 02/08/2013 Office Visit Edited 02/11/2013  8:15 PM by Yehuda Savannah, MD    Patient denies that he has diabetes, believing that previous elevations in CBGs and  hemoglobin A1c have been entirely related to physiologic stress. He has decreased his dose of metformin unilaterally, but reports blood glucose values typically less than 120. A repeat hemoglobin A1c level will be obtained.        Genitourinary   Chronic kidney disease, stage 3, cardiorenal syndrome   Last Assessment & Plan 06/28/2013 Telephone Written 06/29/2013 10:31 AM by Yehuda Savannah, MD    Continued furosemide 40 mg per day; Bmet in 2 weeks.        Other   Overweight(278.02)   Last Assessment & Plan 06/15/2013 Office Visit Written 06/15/2013  3:17 PM by Yehuda Savannah, MD    Patient congratulated on weight loss and encouraged to continue caloric restriction. The benefits in terms of management of his current DJD and promoting long-term good function of his planned prosthetic knees were emphasized.      Anemia, normocytic normochromic   Last Assessment & Plan 06/15/2013 Office Visit Written 06/15/2013  3:14 PM by Yehuda Savannah, MD    New leukocytosis, possibly related to acute gout.  Persistent anemia, likely related to chronic kidney disease. Patient is not symptomatic and does not appear to require medication to stimulate erythropoiesis..      S/P right TKA   Expected blood loss anemia   Obese   Cellulitis   Gout   Muscle weakness (generalized)   Knee stiffness   S/P left TKA   Edema   HLD (hyperlipidemia)   Anxiety state       Imaging: Dg Chest 2 View  07/04/2015   CLINICAL DATA:  Fluid retention, diastolic heart failure,  hypertension, diabetes, former smoker  EXAM: CHEST  2 VIEW  COMPARISON:  03/11/2015  FINDINGS: Stable mild cardiomegaly without CHF or edema. Minor streaky basilar atelectasis. No focal pneumonia, collapse or consolidation. Negative for effusion or pneumothorax. Trachea midline. Degenerative changes of the spine. Stable exam.  IMPRESSION: Cardiomegaly and basilar atelectasis.  No interval change.   Electronically Signed   By: Jerilynn Mages.  Shick M.D.   On:  07/04/2015 20:03

## 2015-07-17 NOTE — Progress Notes (Signed)
Cardiology Office Note   Date:  07/17/2015   ID:  Eliav, Mechling [birth year], MRN [id number]  PCP:  Collene Mares, PA-C  Cardiologist:  Woodroe Chen, NP   Chief Complaint  Patient presents with  . Hypertension  . Coronary Artery Disease    TES to the LAD and diagonal 1, with residual nonobstructive disease in RCA and circumflex      History of Present Illness: [name] is a 69 y.o. male who presents for ongoing assessment and management of hypertension, hypertensive heart disease, CAD, with history of drug-eluting stent to the LAD and diagonal 1, with residual diffuse nonobstructive disease in the right coronary artery and circumflex.  Other history includes chronic kidney disease, obstructive sleep apnea, and obesity.  Most recent echocardiogram in 2014 revealed EF of 5055%.  His [place] in the office on 07/04/2015.  Last office visit.  He was significantly decompensated.  He was admitted for IV diuresis and close monitoring of renal function with a history of chronic kidney disease.  His EKG revealed first AV block with bradycardia.  The patient was discharged on 07/10/2015.  Repeat echocardiogram revealed EF of 55% with grade 2 diastolic dysfunction.  He was diuresis 15 pounds with a discharge weight of 294 pounds.  With chronic kidney disease.  It was found that his baseline creatinine is 2.  On discharge was 2.5, and he was transitioned to oral torsemide.  The patient had home health nurses on discharge.  He was not  placed on ACE inhibitor due to renal dysfunction.   He comes today with complaints of lower extremity edema.  He denies any salt intake.  He states he goes the wire, works out 3 times a week.  He states he does cardio, weight lifting, and treadmill.  The patient states that he is watching his carbs in his been checking his weights and blood glucose.  He brings with him a copy of his most recent readings.  He does not feel like he is getting enough  fluids to drink.  He has been limited to 3 bottles of water a day.  He states that his kidney, function, we will not allow him to overload.  On fluid.  He denies recurrent shortness of breath, chest pain.  Past Medical History  Diagnosis Date  . Diastolic heart failure     Pedal edema, "a long time ago"  . Hypertension     Exercise induced  . NSVT (nonsustained ventricular tachycardia)     Exercise induced  . 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 Obesity   . Degenerative joint disease   . Benign prostatic hypertrophy     Nocturia  . Erectile dysfunction   . High cholesterol   . CAD (coronary artery disease)     a.  NSTEMI 10/2012 s/p DES to LAD & DES to 1st diagonal with residual diffuse nonobstructive dz in LCx/RCA   . Chronic kidney disease, stage 3, mod decreased GFR     Creatinine of 1.5 in 03/2009  . Diabetes mellitus, type II 12/10/2012.  Marland Kitchen Anxiety   . Depression     hx of  . Flu 2013    hx of  . Hemorrhoids   . Tubular adenoma 2014  . GERD (gastroesophageal reflux disease)     Past Surgical History  Procedure Laterality Date  . Cardiac catheterization      with stent placement  . Back surgery  2007    x2  . Hernia repair    . Total knee arthroplasty Right 11/15/2013    Procedure: RIGHT TOTAL KNEE ARTHROPLASTY;  Surgeon: Mauri Pole, MD;  Location: [facility]S;  Service: Orthopedics;  Laterality: Right;  . Colonoscopy with esophagogastroduodenoscopy (egd) N/A 11/23/2013    Dr. Oneida Alar- TCS= left sided colitis and mild proctitis, moderate internal hemorrhoids, small nodule in the rectum, bx=tubular adenoma, EGD=-mild non-erosive gastritis  . Left heart catheterization with coronary angiogram N/A 11/16/2012    Procedure: LEFT HEART CATHETERIZATION WITH CORONARY ANGIOGRAM;  Surgeon: Sherren Mocha, MD;  Location: [facility] CATH LAB;  Service: Cardiovascular;  Laterality: N/A;  . Percutaneous coronary stent intervention (pci-s)  11/16/2012    Procedure:  PERCUTANEOUS CORONARY STENT INTERVENTION (PCI-S);  Surgeon: Sherren Mocha, MD;  Location: [facility] CATH LAB;  Service: Cardiovascular;;  . Total knee arthroplasty Left 03/07/2015    Procedure: LEFT TOTAL KNEE ARTHROPLASTY;  Surgeon: Paralee Cancel, MD;  Location: [facility];  Service: Orthopedics;  Laterality: Left;     Current Outpatient Prescriptions  Medication Sig Dispense Refill  . acetaminophen (TYLENOL) 325 MG tablet Take 1-2 tablets (325-650 mg total) by mouth every 6 (six) hours as needed. (Patient taking differently: Take 325-650 mg by mouth every 6 (six) hours as needed for moderate pain or headache. )    . allopurinol (ZYLOPRIM) 300 MG tablet Take 300 mg by mouth every morning.     Marland Kitchen ALPRAZolam (XANAX) 0.5 MG tablet Take one tablet by mouth twice daily as needed for anxiety (Patient taking differently: Take 0.5 mg by mouth 2 (two) times daily as needed for anxiety. ) 60 tablet 5  . aspirin EC 81 MG tablet Take 81 mg by mouth daily.    Marland Kitchen COLCRYS 0.6 MG tablet Take 0.6 mg by mouth daily.     . ferrous sulfate 325 (65 FE) MG tablet Take 1 tablet (325 mg total) by mouth 3 (three) times daily after meals.  3  . gabapentin (NEURONTIN) 300 MG capsule Take 300 mg by mouth 2 (two) times daily.     Marland Kitchen HYDROcodone-acetaminophen (NORCO) 10-325 MG per tablet Take 1 tablet by mouth 5 (five) times daily as needed for moderate pain or severe pain.    . metoprolol (LOPRESSOR) 50 MG tablet Take 0.5 tablets (25 mg total) by mouth 2 (two) times daily. 30 tablet 0  . NITROSTAT 0.4 MG SL tablet DISSOLVE ONE TABLET UNDER THE TONGUE AS NEEDED FOR CHEST PAIN. 25 tablet 1  . omeprazole (PRILOSEC) 20 MG capsule Take 20 mg by mouth every morning.     . pravastatin (PRAVACHOL) 20 MG tablet Take 20 mg by mouth every morning.     . saxagliptin HCl (ONGLYZA) 2.5 MG TABS tablet Take 2.5 mg by mouth every morning.    . torsemide (DEMADEX) 20 MG tablet Take 2 tablets (40 mg total) by mouth 2 (two) times daily. Start on 7/19 60  tablet 1  . amLODipine (NORVASC) 5 MG tablet Take 1 tablet (5 mg total) by mouth daily. 90 tablet 3   No current facility-administered medications for this visit.    Allergies:   Niacin and related    Social History:  The patient  reports that he quit smoking about 22 years ago. His smoking use included Cigarettes. He has a 30 pack-year smoking history. He has never used smokeless tobacco. He reports that he does not drink alcohol or use illicit drugs.   Family History:  The patient's family history includes  Cancer in his other; Diabetes in an other family member; Heart disease in an other family member; Hypertension in his other. There is no history of Colon cancer.    ROS: .   All other systems are reviewed and negative.Unless otherwise mentioned in  H&P above.   PHYSICAL EXAM: VS:  BP 110/62 mmHg  Pulse 66  Ht 6' 3" (1.905 m)  Wt 296 lb (134.265 kg)  BMI 37.00 kg/m2  SpO2 97% , BMI Body mass index is 37 kg/(m^2). GEN: Well nourished, well developed, in no acute distress HEENT: normal Neck: no JVD, carotid bruits, or masses Cardiac: RRR; no murmurs, rubs, or gallops,no edema  Respiratory:  clear to auscultation bilaterally, normal work of breathing GI: soft, nontender, nondistended, + BS MS: no deformity or atrophy Skin: warm and dry, no rash Neuro:  Strength and sensation are intact Psych: euthymic mood, full affect     Recent Labs: 07/04/2015: Hemoglobin 11.8*; Magnesium 1.7; Platelets 183 07/05/2015: TSH 1.725 07/10/2015: BUN 69*; Creatinine, Ser 2.50*; Potassium 4.3; Sodium 138    Lipid Panel    Component Value Date/Time   CHOL 121 11/15/2012 0625   TRIG 57 11/15/2012 0625   HDL 36* 11/15/2012 0625   CHOLHDL 3.4 11/15/2012 0625   VLDL 11 11/15/2012 0625   LDLCALC 74 11/15/2012 0625      Wt Readings from Last 3 Encounters:  07/17/15 296 lb (134.265 kg)  07/10/15 294 lb 8.6 oz (133.6 kg)  07/04/15 311 lb 12.8 oz (141.432 kg)      Other studies  Reviewed: Additional studies/ records that were reviewed today include: None Review of the above records demonstrates: N/A   ASSESSMENT AND PLAN:  1. Hypertension:  Blood pressure is low normal today.  He is also complaining of some lower extremity edema.  I will decrease his amlodipine to 5 mg daily.  This should help with some of the lower extremity edema, but not all.  He had been taking amlodipine 5 mg by mouth twice a day.  We will change his am dose only. We will see him again in one month.  I am going to repeat his BMET to evaluate his kidney function.  2. Chronic diastolic CHF: Echo completed on 07/04/2015 demonstrated normal EF with grade 2 diastolic dysfunction.  The patient was placed on an ACE inhibitor due to 2 chronic kidney disease.  He continues on torsemide 40 mg in the morning a 40 mg in the evening.  He is getting good diuresis from this.  Often feeling thirsty.  He continues on metoprolol 50 mg one half tablet twice a day.  3. Diabetes: He is intravenous blood sugar daily.  His been running in the 140s.  He is advised to followup with his primary care physician, for adjustment in medications.  4. CKD Stage III: He is currently not being followed by nephrology.we will repeat be met and send results to primary care.  Current medicines are reviewed at length with the patient today.    Labs/ tests ordered today include: BMET Orders Placed This Encounter  Procedures  . Basic Metabolic Panel (BMET)     Disposition:   FU with one month   Signed, Jory Sims, NP  07/17/2015 3:49 PM    [facility] [address] [place], [place] [address] Phone: [phone]; Fax: [phone]

## 2015-07-17 NOTE — Patient Instructions (Signed)
Your physician recommends that you schedule a follow-up appointment in: 1 month with Dr. Bronson Ing.  Your physician recommends that you return for lab work in: Today Artist)  Your physician has recommended you make the following change in your medication:   Decrease Amlodipine to 5 mg Daily  Thank you for choosing [facility]!

## 2015-07-18 NOTE — Addendum Note (Signed)
Addended by: Barbarann Ehlers A on: 07/18/2015 04:08 PM   Modules accepted: Orders

## 2015-07-19 DIAGNOSIS — N183 Chronic kidney disease, stage 3 (moderate): Secondary | ICD-10-CM | POA: Diagnosis not present

## 2015-07-19 DIAGNOSIS — E1129 Type 2 diabetes mellitus with other diabetic kidney complication: Secondary | ICD-10-CM | POA: Diagnosis not present

## 2015-07-19 DIAGNOSIS — I5033 Acute on chronic diastolic (congestive) heart failure: Secondary | ICD-10-CM | POA: Diagnosis not present

## 2015-07-19 DIAGNOSIS — K219 Gastro-esophageal reflux disease without esophagitis: Secondary | ICD-10-CM | POA: Diagnosis not present

## 2015-07-19 DIAGNOSIS — I129 Hypertensive chronic kidney disease with stage 1 through stage 4 chronic kidney disease, or unspecified chronic kidney disease: Secondary | ICD-10-CM | POA: Diagnosis not present

## 2015-07-19 DIAGNOSIS — E785 Hyperlipidemia, unspecified: Secondary | ICD-10-CM | POA: Diagnosis not present

## 2015-07-19 DIAGNOSIS — F419 Anxiety disorder, unspecified: Secondary | ICD-10-CM | POA: Diagnosis not present

## 2015-07-20 DIAGNOSIS — G894 Chronic pain syndrome: Secondary | ICD-10-CM | POA: Diagnosis not present

## 2015-07-20 DIAGNOSIS — E119 Type 2 diabetes mellitus without complications: Secondary | ICD-10-CM | POA: Diagnosis not present

## 2015-07-20 DIAGNOSIS — M1991 Primary osteoarthritis, unspecified site: Secondary | ICD-10-CM | POA: Diagnosis not present

## 2015-07-20 DIAGNOSIS — Z6835 Body mass index (BMI) 35.0-35.9, adult: Secondary | ICD-10-CM | POA: Diagnosis not present

## 2015-07-20 DIAGNOSIS — E860 Dehydration: Secondary | ICD-10-CM | POA: Diagnosis not present

## 2015-07-20 DIAGNOSIS — Z1389 Encounter for screening for other disorder: Secondary | ICD-10-CM | POA: Diagnosis not present

## 2015-07-20 DIAGNOSIS — I10 Essential (primary) hypertension: Secondary | ICD-10-CM | POA: Diagnosis not present

## 2015-07-20 DIAGNOSIS — I509 Heart failure, unspecified: Secondary | ICD-10-CM | POA: Diagnosis not present

## 2015-07-21 DIAGNOSIS — N183 Chronic kidney disease, stage 3 (moderate): Secondary | ICD-10-CM | POA: Diagnosis not present

## 2015-07-21 DIAGNOSIS — F419 Anxiety disorder, unspecified: Secondary | ICD-10-CM | POA: Diagnosis not present

## 2015-07-21 DIAGNOSIS — E1129 Type 2 diabetes mellitus with other diabetic kidney complication: Secondary | ICD-10-CM | POA: Diagnosis not present

## 2015-07-21 DIAGNOSIS — I129 Hypertensive chronic kidney disease with stage 1 through stage 4 chronic kidney disease, or unspecified chronic kidney disease: Secondary | ICD-10-CM | POA: Diagnosis not present

## 2015-07-21 DIAGNOSIS — E785 Hyperlipidemia, unspecified: Secondary | ICD-10-CM | POA: Diagnosis not present

## 2015-07-21 DIAGNOSIS — K219 Gastro-esophageal reflux disease without esophagitis: Secondary | ICD-10-CM | POA: Diagnosis not present

## 2015-07-21 DIAGNOSIS — I5033 Acute on chronic diastolic (congestive) heart failure: Secondary | ICD-10-CM | POA: Diagnosis not present

## 2015-07-22 DIAGNOSIS — I5033 Acute on chronic diastolic (congestive) heart failure: Secondary | ICD-10-CM | POA: Diagnosis not present

## 2015-07-22 DIAGNOSIS — N183 Chronic kidney disease, stage 3 (moderate): Secondary | ICD-10-CM | POA: Diagnosis not present

## 2015-07-22 DIAGNOSIS — E785 Hyperlipidemia, unspecified: Secondary | ICD-10-CM | POA: Diagnosis not present

## 2015-07-22 DIAGNOSIS — K219 Gastro-esophageal reflux disease without esophagitis: Secondary | ICD-10-CM | POA: Diagnosis not present

## 2015-07-22 DIAGNOSIS — E1129 Type 2 diabetes mellitus with other diabetic kidney complication: Secondary | ICD-10-CM | POA: Diagnosis not present

## 2015-07-22 DIAGNOSIS — F419 Anxiety disorder, unspecified: Secondary | ICD-10-CM | POA: Diagnosis not present

## 2015-07-22 DIAGNOSIS — I129 Hypertensive chronic kidney disease with stage 1 through stage 4 chronic kidney disease, or unspecified chronic kidney disease: Secondary | ICD-10-CM | POA: Diagnosis not present

## 2015-07-24 ENCOUNTER — Ambulatory Visit (INDEPENDENT_AMBULATORY_CARE_PROVIDER_SITE_OTHER): Payer: Medicare Other

## 2015-07-24 ENCOUNTER — Emergency Department (HOSPITAL_COMMUNITY)
Admission: EM | Admit: 2015-07-24 | Discharge: 2015-07-24 | Disposition: A | Discharge status: Home / Self Care | Payer: Medicare Other | Attending: Emergency Medicine | Admitting: Emergency Medicine

## 2015-07-24 ENCOUNTER — Encounter (HOSPITAL_COMMUNITY): Payer: Self-pay

## 2015-07-24 VITALS — Wt 305.0 lb

## 2015-07-24 DIAGNOSIS — R635 Abnormal weight gain: Secondary | ICD-10-CM | POA: Diagnosis not present

## 2015-07-24 DIAGNOSIS — I129 Hypertensive chronic kidney disease with stage 1 through stage 4 chronic kidney disease, or unspecified chronic kidney disease: Secondary | ICD-10-CM | POA: Diagnosis not present

## 2015-07-24 DIAGNOSIS — Z87891 Personal history of nicotine dependence: Secondary | ICD-10-CM | POA: Diagnosis not present

## 2015-07-24 DIAGNOSIS — F419 Anxiety disorder, unspecified: Secondary | ICD-10-CM | POA: Insufficient documentation

## 2015-07-24 DIAGNOSIS — E780 Pure hypercholesterolemia: Secondary | ICD-10-CM | POA: Insufficient documentation

## 2015-07-24 DIAGNOSIS — Z8709 Personal history of other diseases of the respiratory system: Secondary | ICD-10-CM | POA: Insufficient documentation

## 2015-07-24 DIAGNOSIS — I5032 Chronic diastolic (congestive) heart failure: Secondary | ICD-10-CM | POA: Diagnosis present

## 2015-07-24 DIAGNOSIS — E669 Obesity, unspecified: Secondary | ICD-10-CM | POA: Diagnosis not present

## 2015-07-24 DIAGNOSIS — R609 Edema, unspecified: Secondary | ICD-10-CM | POA: Diagnosis not present

## 2015-07-24 DIAGNOSIS — I2510 Atherosclerotic heart disease of native coronary artery without angina pectoris: Secondary | ICD-10-CM | POA: Insufficient documentation

## 2015-07-24 DIAGNOSIS — E119 Type 2 diabetes mellitus without complications: Secondary | ICD-10-CM | POA: Diagnosis not present

## 2015-07-24 DIAGNOSIS — Z9889 Other specified postprocedural states: Secondary | ICD-10-CM | POA: Insufficient documentation

## 2015-07-24 DIAGNOSIS — F329 Major depressive disorder, single episode, unspecified: Secondary | ICD-10-CM | POA: Diagnosis not present

## 2015-07-24 DIAGNOSIS — K219 Gastro-esophageal reflux disease without esophagitis: Secondary | ICD-10-CM | POA: Insufficient documentation

## 2015-07-24 DIAGNOSIS — N289 Disorder of kidney and ureter, unspecified: Secondary | ICD-10-CM | POA: Diagnosis not present

## 2015-07-24 DIAGNOSIS — N183 Chronic kidney disease, stage 3 unspecified: Secondary | ICD-10-CM | POA: Diagnosis present

## 2015-07-24 DIAGNOSIS — I5030 Unspecified diastolic (congestive) heart failure: Secondary | ICD-10-CM | POA: Diagnosis not present

## 2015-07-24 DIAGNOSIS — Z79899 Other long term (current) drug therapy: Secondary | ICD-10-CM | POA: Diagnosis not present

## 2015-07-24 DIAGNOSIS — Z7982 Long term (current) use of aspirin: Secondary | ICD-10-CM | POA: Diagnosis not present

## 2015-07-24 DIAGNOSIS — M7989 Other specified soft tissue disorders: Secondary | ICD-10-CM | POA: Diagnosis present

## 2015-07-24 LAB — CBC WITH DIFFERENTIAL/PLATELET
Basophils Absolute: 0 10*3/uL (ref 0.0–0.1)
Basophils Relative: 0 % (ref 0–1)
EOS PCT: 4 % (ref 0–5)
Eosinophils Absolute: 0.2 10*3/uL (ref 0.0–0.7)
HCT: 32.6 % — ABNORMAL LOW (ref 39.0–52.0)
Hemoglobin: 10.7 g/dL — ABNORMAL LOW (ref 13.0–17.0)
LYMPHS ABS: 1 10*3/uL (ref 0.7–4.0)
LYMPHS PCT: 19 % (ref 12–46)
MCH: 32.3 pg (ref 26.0–34.0)
MCHC: 32.8 g/dL (ref 30.0–36.0)
MCV: 98.5 fL (ref 78.0–100.0)
Monocytes Absolute: 0.4 10*3/uL (ref 0.1–1.0)
Monocytes Relative: 7 % (ref 3–12)
NEUTROS PCT: 70 % (ref 43–77)
Neutro Abs: 3.7 10*3/uL (ref 1.7–7.7)
Platelets: 193 10*3/uL (ref 150–400)
RBC: 3.31 MIL/uL — AB (ref 4.22–5.81)
RDW: 15 % (ref 11.5–15.5)
WBC: 5.2 10*3/uL (ref 4.0–10.5)

## 2015-07-24 LAB — BASIC METABOLIC PANEL
ANION GAP: 8 (ref 5–15)
BUN: 59 mg/dL — ABNORMAL HIGH (ref 6–20)
CO2: 25 mmol/L (ref 22–32)
CREATININE: 2.25 mg/dL — AB (ref 0.61–1.24)
Calcium: 8.8 mg/dL — ABNORMAL LOW (ref 8.9–10.3)
Chloride: 106 mmol/L (ref 101–111)
GFR calc Af Amer: 32 mL/min — ABNORMAL LOW (ref >60)
GFR, EST NON AFRICAN AMERICAN: 28 mL/min — AB (ref >60)
Glucose, Bld: 107 mg/dL — ABNORMAL HIGH (ref 65–99)
Potassium: 4.5 mmol/L (ref 3.5–5.1)
SODIUM: 139 mmol/L (ref 135–145)

## 2015-07-24 MED ORDER — FUROSEMIDE 40 MG PO TABS: 40.0000 mg | ORAL_TABLET | Freq: Two times a day (BID) | ORAL | Status: DC

## 2015-07-24 MED ORDER — FUROSEMIDE 10 MG/ML IJ SOLN
40.0000 mg | Freq: Once | INTRAMUSCULAR | Status: AC
  Administered 2015-07-24: 40 mg via INTRAVENOUS
  Filled 2015-07-24: qty 4

## 2015-07-24 NOTE — ED Notes (Signed)
PCP instructed pt to stop taking diuretics 7/29, pt woke up with bilateral lower extremity edema 7/30

## 2015-07-24 NOTE — ED Notes (Signed)
Patient eating supper tray

## 2015-07-24 NOTE — Discharge Instructions (Signed)
Peripheral Edema You have swelling in your legs (peripheral edema). This swelling is due to excess accumulation of salt and water in your body. Edema may be a sign of heart, kidney or liver disease, or a side effect of a medication. It may also be due to problems in the leg veins. Elevating your legs and using special support stockings may be very helpful, if the cause of the swelling is due to poor venous circulation. Avoid long periods of standing, whatever the cause. Treatment of edema depends on identifying the cause. Chips, pretzels, pickles and other salty foods should be avoided. Restricting salt in your diet is almost always needed. Water pills (diuretics) are often used to remove the excess salt and water from your body via urine. These medicines prevent the kidney from reabsorbing sodium. This increases urine flow. Diuretic treatment may also result in lowering of potassium levels in your body. Potassium supplements may be needed if you have to use diuretics daily. Daily weights can help you keep track of your progress in clearing your edema. You should call your caregiver for follow up care as recommended. SEEK IMMEDIATE MEDICAL CARE IF:   You have increased swelling, pain, redness, or heat in your legs.  You develop shortness of breath, especially when lying down.  You develop chest or abdominal pain, weakness, or fainting.  You have a fever. Document Released: 01/16/2005 Document Revised: 03/02/2012 Document Reviewed: 12/27/2009 [facility] Patient Information 2015 [place], [place]. This information is not intended to replace advice given to you by your health care provider. Make sure you discuss any questions you have with your health care provider.  Kidney Failure Kidney failure happens when the kidneys cannot remove waste and excess fluid that naturally builds up in your blood after your body breaks down food. This leads to a dangerous buildup of waste products and fluid in the  blood. HOME CARE  Follow your diet as told by your doctor.  Take all medicines as told by your doctor.  Keep all of your dialysis appointments. Call if you are unable to keep an appointment. GET HELP RIGHT AWAY IF:   You make a lot more or very little pee (urine).  Your face or ankles puff up (swell).  You develop shortness of breath.  You develop weakness, feel tired, or you do not feel hungry (appetite loss).  You feel poorly for no known reason. MAKE SURE YOU:   Understand these instructions.  Will watch your condition.  Will get help right away if you are not doing well or get worse. Document Released: 03/05/2010 Document Revised: 03/02/2012 Document Reviewed: 04/11/2010 [facility] Patient Information 2015 [place], [place]. This information is not intended to replace advice given to you by your health care provider. Make sure you discuss any questions you have with your health care provider.

## 2015-07-24 NOTE — ED Provider Notes (Signed)
CSN: [id number]     Arrival date & time 07/24/15  1643 History   First MD Initiated Contact with Patient 07/24/15 1710     Chief Complaint  Patient presents with  . Leg Swelling     (Consider location/radiation/quality/duration/timing/severity/associated sxs/prior Treatment) HPI   [name] is a 70 y.o. male who presents for evaluation of lower extremity swelling. Symptoms gradually onset since discharge from the hospital about 2 weeks ago. He states that his weight has increased from 288-308 that period of time. He saw his PCP, 5 days ago, had blood work done that showed rising creatinine, so he was told to stop taking his diuretic. He denies chest pain, shortness of breath, nausea, vomiting, fever, chills, weakness or dizziness. He states that his PCP is referring him to a renal doctor. He is taking his other medications as directed. There are no other known modifying factors.    Past Medical History  Diagnosis Date  . Diastolic heart failure     Pedal edema, "a long time ago"  . Hypertension     Exercise induced  . NSVT (nonsustained ventricular tachycardia)     Exercise induced  . 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 Obesity   . Degenerative joint disease   . Benign prostatic hypertrophy     Nocturia  . Erectile dysfunction   . High cholesterol   . CAD (coronary artery disease)     a.  NSTEMI 10/2012 s/p DES to LAD & DES to 1st diagonal with residual diffuse nonobstructive dz in LCx/RCA   . Chronic kidney disease, stage 3, mod decreased GFR     Creatinine of 1.5 in 03/2009  . Diabetes mellitus, type II 12/10/2012.  Marland Kitchen Anxiety   . Depression     hx of  . Flu 2013    hx of  . Hemorrhoids   . Tubular adenoma 2014  . GERD (gastroesophageal reflux disease)    Past Surgical History  Procedure Laterality Date  . Cardiac catheterization      with stent placement  . Back surgery  2007    x2  . Hernia repair    . Total knee arthroplasty Right  11/15/2013    Procedure: RIGHT TOTAL KNEE ARTHROPLASTY;  Surgeon: Mauri Pole, MD;  Location: [facility];  Service: Orthopedics;  Laterality: Right;  . Colonoscopy with esophagogastroduodenoscopy (egd) N/A 11/23/2013    Dr. Oneida Alar- TCS= left sided colitis and mild proctitis, moderate internal hemorrhoids, small nodule in the rectum, bx=tubular adenoma, EGD=-mild non-erosive gastritis  . Left heart catheterization with coronary angiogram N/A 11/16/2012    Procedure: LEFT HEART CATHETERIZATION WITH CORONARY ANGIOGRAM;  Surgeon: Sherren Mocha, MD;  Location: [facility] CATH LAB;  Service: Cardiovascular;  Laterality: N/A;  . Percutaneous coronary stent intervention (pci-s)  11/16/2012    Procedure: PERCUTANEOUS CORONARY STENT INTERVENTION (PCI-S);  Surgeon: Sherren Mocha, MD;  Location: [facility] CATH LAB;  Service: Cardiovascular;;  . Total knee arthroplasty Left 03/07/2015    Procedure: LEFT TOTAL KNEE ARTHROPLASTY;  Surgeon: Paralee Cancel, MD;  Location: [facility];  Service: Orthopedics;  Laterality: Left;   Family History  Problem Relation Age of Onset  . Hypertension Other   . Cancer Other   . Heart disease    . Diabetes    . Colon cancer Neg Hx    History  Substance Use Topics  . Smoking status: Former Smoker -- 1.00 packs/day for 30 years    Types:  Cigarettes    Quit date: 12/23/1992  . Smokeless tobacco: Never Used  . Alcohol Use: No    Review of Systems  All other systems reviewed and are negative.     Allergies  Niacin and related  Home Medications   Prior to Admission medications   Medication Sig Start Date End Date Taking? Authorizing Provider  allopurinol (ZYLOPRIM) 300 MG tablet Take 300 mg by mouth every morning.    Yes Historical Provider, MD  ALPRAZolam Duanne Moron) 0.5 MG tablet Take one tablet by mouth twice daily as needed for anxiety Patient taking differently: Take 0.5 mg by mouth 3 (three) times daily.  03/09/15  Yes Matthew Babish, PA-C  amLODipine (NORVASC) 5 MG tablet Take 1  tablet (5 mg total) by mouth daily. 07/17/15  Yes Lendon Colonel, NP  aspirin EC 81 MG tablet Take 81 mg by mouth daily.   Yes Historical Provider, MD  ferrous sulfate 325 (65 FE) MG tablet Take 1 tablet (325 mg total) by mouth 3 (three) times daily after meals. 03/09/15  Yes Danae Orleans, PA-C  gabapentin (NEURONTIN) 300 MG capsule Take 300 mg by mouth 2 (two) times daily.    Yes Historical Provider, MD  HYDROcodone-acetaminophen (NORCO) 10-325 MG per tablet Take 1 tablet by mouth 5 (five) times daily as needed for moderate pain or severe pain.   Yes Historical Provider, MD  metoprolol (LOPRESSOR) 50 MG tablet Take 0.5 tablets (25 mg total) by mouth 2 (two) times daily. 07/10/15  Yes Kathie Dike, MD  NITROSTAT 0.4 MG SL tablet DISSOLVE ONE TABLET UNDER THE TONGUE AS NEEDED FOR CHEST PAIN.   Yes Herminio Commons, MD  omeprazole (PRILOSEC) 20 MG capsule Take 20 mg by mouth every morning.  01/27/14  Yes Historical Provider, MD  pravastatin (PRAVACHOL) 20 MG tablet Take 20 mg by mouth every morning.    Yes Historical Provider, MD  saxagliptin HCl (ONGLYZA) 2.5 MG TABS tablet Take 2.5 mg by mouth every morning.   Yes Historical Provider, MD  acetaminophen (TYLENOL) 325 MG tablet Take 1-2 tablets (325-650 mg total) by mouth every 6 (six) hours as needed. Patient not taking: Reported on 07/24/2015 11/17/13   Danae Orleans, PA-C  furosemide (LASIX) 40 MG tablet Take 1 tablet (40 mg total) by mouth 2 (two) times daily. 07/24/15   Daleen Bo, MD  torsemide (DEMADEX) 20 MG tablet Take 2 tablets (40 mg total) by mouth 2 (two) times daily. Start on 7/19 Patient not taking: Reported on 07/24/2015 07/10/15   Kathie Dike, MD   BP 151/72 mmHg  Pulse 51  Temp(Src) 98.6 F (37 C) (Oral)  Resp 18  Ht 6' 3"  (1.905 m)  Wt 308 lb (139.708 kg)  BMI 38.50 kg/m2  SpO2 100% Physical Exam  Constitutional: He is oriented to person, place, and time. He appears well-developed.  Elderly, robust  HENT:  Head:  Normocephalic and atraumatic.  Right Ear: External ear normal.  Left Ear: External ear normal.  Eyes: Conjunctivae and EOM are normal. Pupils are equal, round, and reactive to light.  Neck: Normal range of motion and phonation normal. Neck supple.  Cardiovascular: Normal rate, regular rhythm and normal heart sounds.   Pulmonary/Chest: Effort normal and breath sounds normal. He exhibits no bony tenderness.  Abdominal: Soft. He exhibits no distension. There is no tenderness.  Musculoskeletal: Normal range of motion. He exhibits edema (3+ pitting edema lower legs bilaterally).  Neurological: He is alert and oriented to person, place, and time. No  cranial nerve deficit or sensory deficit. He exhibits normal muscle tone. Coordination normal.  Skin: Skin is warm, dry and intact.  Psychiatric: He has a normal mood and affect. His behavior is normal. Judgment and thought content normal.  Nursing note and vitals reviewed.   ED Course  Procedures (including critical care time)  Medications  furosemide (LASIX) injection 40 mg (40 mg Intravenous Given 07/24/15 1805)    Patient Vitals for the past 24 hrs:  BP Temp Temp src Pulse Resp SpO2 Height Weight  07/24/15 1843 151/72 mmHg - - (!) 51 18 100 % - -  07/24/15 1648 163/76 mmHg 98.6 F (37 C) Oral 70 20 100 % 6' 3"  (1.905 m) (!) 308 lb (139.708 kg)    7:30 PM Reevaluation with update and discussion. After initial assessment and treatment, an updated evaluation reveals he has diuresed about 800 cc. Findings discussed with patient, all questions answered. [place] with hospitalist, they saw the patient and recommended discharge with prescription of Lasix 40 mg twice a day   Labs Review Labs Reviewed  BASIC METABOLIC PANEL - Abnormal; Notable for the following:    Glucose, Bld 107 (*)    BUN 59 (*)    Creatinine, Ser 2.25 (*)    Calcium 8.8 (*)    GFR calc non Af Amer 28 (*)    GFR calc Af Amer 32 (*)    All other  components within normal limits  CBC WITH DIFFERENTIAL/PLATELET - Abnormal; Notable for the following:    RBC 3.31 (*)    Hemoglobin 10.7 (*)    HCT 32.6 (*)    All other components within normal limits    Imaging Review No results found.   EKG Interpretation None      MDM   Final diagnoses:  Peripheral edema  Renal insufficiency    Peripheral Edema with renal insufficiency which has improved since pt. stopped his torsemide. He voided (about 800 cc) with Lasix. Doubt ACS, acute CHF or metabolic instability.  Nursing Notes Reviewed/ Care Coordinated Applicable Imaging Reviewed Interpretation of Laboratory Data incorporated into ED treatment  The patient appears reasonably screened and/or stabilized for discharge and I doubt any other medical condition or other [facility] requiring further screening, evaluation, or treatment in the ED at this time prior to discharge.  Plan: Home Medications- Start Lasix BID; Home Treatments- rest, continue fluid restriction; return here if the recommended treatment, does not improve the symptoms; Recommended follow up- PCP 1 week     Daleen Bo, MD 07/25/15 1645

## 2015-07-24 NOTE — Consult Note (Signed)
[facility] History and Physical  [name] ELF:[id number] DOB: 27-Jul-1946 DOA: 07/24/2015  Referring physician: Dr. Eulis Foster - [facility] PCP: Collene Mares, PA-C   Chief Complaint: wt gain  HPI: [name] is a 69 y.o. male  Patient states that his weight was fairly stable after being discharged from hospital on 07/10/2015. Maintains that his weight was around 295 since that time. Patient follow-up with his primary care physician on 07/20/2015 and was told to stop his diuretic, torsemide, due to an elevation in his creatinine. Since that time patient is not taken any diuretic but has steadily gained weight. States that his legs have ballooned up with fluid up to his hip. States that his weight at home has been around 310. Denies any chest pain, shortness of breath, palpitations, nausea, vomiting, fevers, cough, neck stiffness, headache, abdominal pain, back pain. Patient has not restarted his torsemide to try and make the symptoms better. Symptoms are constant.   Review of Systems:  Per history of present illness with all other systems negative.  Past Medical History  Diagnosis Date  . Diastolic heart failure     Pedal edema, "a long time ago"  . Hypertension     Exercise induced  . NSVT (nonsustained ventricular tachycardia)     Exercise induced  . 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 Obesity   . Degenerative joint disease   . Benign prostatic hypertrophy     Nocturia  . Erectile dysfunction   . High cholesterol   . CAD (coronary artery disease)     a.  NSTEMI 10/2012 s/p DES to LAD & DES to 1st diagonal with residual diffuse nonobstructive dz in LCx/RCA   . Chronic kidney disease, stage 3, mod decreased GFR     Creatinine of 1.5 in 03/2009  . Diabetes mellitus, type II 12/10/2012.  Marland Kitchen Anxiety   . Depression     hx of  . Flu 2013    hx of  . Hemorrhoids   . Tubular adenoma 2014  . GERD (gastroesophageal reflux disease)    Past Surgical  History  Procedure Laterality Date  . Cardiac catheterization      with stent placement  . Back surgery  2007    x2  . Hernia repair    . Total knee arthroplasty Right 11/15/2013    Procedure: RIGHT TOTAL KNEE ARTHROPLASTY;  Surgeon: Mauri Pole, MD;  Location: [facility];  Service: Orthopedics;  Laterality: Right;  . Colonoscopy with esophagogastroduodenoscopy (egd) N/A 11/23/2013    Dr. Oneida Alar- TCS= left sided colitis and mild proctitis, moderate internal hemorrhoids, small nodule in the rectum, bx=tubular adenoma, EGD=-mild non-erosive gastritis  . Left heart catheterization with coronary angiogram N/A 11/16/2012    Procedure: LEFT HEART CATHETERIZATION WITH CORONARY ANGIOGRAM;  Surgeon: Sherren Mocha, MD;  Location: [facility] CATH LAB;  Service: Cardiovascular;  Laterality: N/A;  . Percutaneous coronary stent intervention (pci-s)  11/16/2012    Procedure: PERCUTANEOUS CORONARY STENT INTERVENTION (PCI-S);  Surgeon: Sherren Mocha, MD;  Location: [facility] [place] CATH LAB;  Service: Cardiovascular;;  . Total knee arthroplasty Left 03/07/2015    Procedure: LEFT TOTAL KNEE ARTHROPLASTY;  Surgeon: Paralee Cancel, MD;  Location: [facility];  Service: Orthopedics;  Laterality: Left;   Social History:  reports that he quit smoking about 22 years ago. His smoking use included Cigarettes. He has a 30 pack-year smoking history. He has never used smokeless tobacco. He reports that he does not drink  alcohol or use illicit drugs.  Allergies  Allergen Reactions  . Niacin And Related Other (See Comments)    Reaction: flushing and burning side effects even with extended release    Family History  Problem Relation Age of Onset  . Hypertension Other   . Cancer Other   . Heart disease    . Diabetes    . Colon cancer Neg Hx      Prior to Admission medications   Medication Sig Start Date End Date Taking? Authorizing Provider  allopurinol (ZYLOPRIM) 300 MG tablet Take 300 mg by mouth every morning.    Yes Historical Provider,  MD  ALPRAZolam Duanne Moron) 0.5 MG tablet Take one tablet by mouth twice daily as needed for anxiety Patient taking differently: Take 0.5 mg by mouth 3 (three) times daily.  03/09/15  Yes Matthew Babish, PA-C  amLODipine (NORVASC) 5 MG tablet Take 1 tablet (5 mg total) by mouth daily. 07/17/15  Yes Lendon Colonel, NP  aspirin EC 81 MG tablet Take 81 mg by mouth daily.   Yes Historical Provider, MD  ferrous sulfate 325 (65 FE) MG tablet Take 1 tablet (325 mg total) by mouth 3 (three) times daily after meals. 03/09/15  Yes Danae Orleans, PA-C  gabapentin (NEURONTIN) 300 MG capsule Take 300 mg by mouth 2 (two) times daily.    Yes Historical Provider, MD  HYDROcodone-acetaminophen (NORCO) 10-325 MG per tablet Take 1 tablet by mouth 5 (five) times daily as needed for moderate pain or severe pain.   Yes Historical Provider, MD  metoprolol (LOPRESSOR) 50 MG tablet Take 0.5 tablets (25 mg total) by mouth 2 (two) times daily. 07/10/15  Yes Kathie Dike, MD  NITROSTAT 0.4 MG SL tablet DISSOLVE ONE TABLET UNDER THE TONGUE AS NEEDED FOR CHEST PAIN.   Yes Herminio Commons, MD  omeprazole (PRILOSEC) 20 MG capsule Take 20 mg by mouth every morning.  01/27/14  Yes Historical Provider, MD  pravastatin (PRAVACHOL) 20 MG tablet Take 20 mg by mouth every morning.    Yes Historical Provider, MD  saxagliptin HCl (ONGLYZA) 2.5 MG TABS tablet Take 2.5 mg by mouth every morning.   Yes Historical Provider, MD  acetaminophen (TYLENOL) 325 MG tablet Take 1-2 tablets (325-650 mg total) by mouth every 6 (six) hours as needed. Patient not taking: Reported on 07/24/2015 11/17/13   Danae Orleans, PA-C  torsemide (DEMADEX) 20 MG tablet Take 2 tablets (40 mg total) by mouth 2 (two) times daily. Start on 7/19 Patient not taking: Reported on 07/24/2015 07/10/15   Kathie Dike, MD   Physical Exam: Filed Vitals:   07/24/15 1648 07/24/15 1843  BP: 163/76 151/72  Pulse: 70 51  Temp: 98.6 F (37 C)   TempSrc: Oral   Resp: 20 18    Height: 6' 3"  (1.905 m)   Weight: 139.708 kg (308 lb)   SpO2: 100% 100%    Wt Readings from Last 3 Encounters:  07/24/15 139.708 kg (308 lb)  07/24/15 138.347 kg (305 lb)  07/17/15 134.265 kg (296 lb)    General:  Appears calm and comfortable Eyes:  PERRL, normal lids, irises & conjunctiva ENT:  grossly normal hearing, lips & tongue Neck:  no LAD, masses or thyromegaly Cardiovascular:  RRR, 2/6 systolic murmur. 2+ pitting edema to the level of the mid thighs bilaterally. Respiratory:  CTA bilaterally, no w/r/r. Normal respiratory effort. Abdomen:  soft, ntnd Skin:  no rash or induration seen on limited exam Musculoskeletal:  grossly normal tone BUE/BLE  Psychiatric:  grossly normal mood and affect, speech fluent and appropriate Neurologic:  grossly non-focal.          Labs on Admission:  Basic Metabolic Panel:  Recent Labs Lab 07/24/15 1800  NA 139  K 4.5  CL 106  CO2 25  GLUCOSE 107*  BUN 59*  CREATININE 2.25*  CALCIUM 8.8*   Liver Function Tests: No results for input(s): AST, ALT, ALKPHOS, BILITOT, PROT, ALBUMIN in the last 168 hours. No results for input(s): LIPASE, AMYLASE in the last 168 hours. No results for input(s): AMMONIA in the last 168 hours. CBC:  Recent Labs Lab 07/24/15 1800  WBC 5.2  NEUTROABS 3.7  HGB 10.7*  HCT 32.6*  MCV 98.5  PLT 193   Cardiac Enzymes: No results for input(s): CKTOTAL, CKMB, CKMBINDEX, TROPONINI in the last 168 hours.  BNP (last 3 results) No results for input(s): BNP in the last 8760 hours.  ProBNP (last 3 results) No results for input(s): PROBNP in the last 8760 hours.  CBG: No results for input(s): GLUCAP in the last 168 hours.  Radiological Exams on Admission: No results found.   Assessment/Plan Active Problems:   Chronic kidney disease, stage 3, cardiorenal syndrome   Diastolic CHF, chronic    Paged to the ED to evaluate patient and give recommendations for diuresis. Patient's creatinine function  appears to be at baseline with a value of 2.25. It appears that the acute kidney injury that he had sustained last week due to his diuresis has now resolved. Of note on patient's last admission on 07/04/2015 through 07/10/2015 patient was diuresed on Lasix and his renal function improved throughout the hospital admission. The patient is fluid overloaded there are no acute symptoms concerning for decompensation requiring admission. Would recommend patient be switched from torsemide to Lasix 40 mg twice a day. Patient was given Lasix 40 mg IV in the ED. Patient will then check his weight daily and follow-up with his primary care physician or cardiologist sometime in the next week. If patient is unable to achieve diuresis under no regimen he will return for likely admission and IV diuresis and management. Would also recommend patient follow-up and establish care with nephrology soon as over time his renal function will continue to deteriorate. No other changes to his medications.  Discussed care plan with ED physician Dr.Wentz, who provide patient with the necessary Lasix prescription and discharge instructions.   Family Communication: Wife Disposition Plan: Discharge  Marshe Shrestha Lenna Sciara, MD Family Medicine [facility] [email or web address] Password [email or web address]

## 2015-07-24 NOTE — Progress Notes (Signed)
Patient came in to office today for what was supposed to be a BP check.However, pt arrived loud and aggressive stating he was going to see a doctor and be admitted.He had his suitcase packed in car.We attempted to obtain VS and after we weighed him,he became more upset when he saw his weight.He had gained 9 lbs since last weeks visit 296 lbs to 305 lbs.He repeatedly  stated his pcp took him off all his diuretics and he can feel his legs swelling to his shorts.We were unable to get complete VS because he would not stop talking.After speaking with his cardiologist, Dr Bronson Ing, it was decided to send him direct to ED.He then walked to car,retrieved suitcase and refused wheelchair to ED.Escorted via [employer] LPN to ED

## 2015-07-29 DIAGNOSIS — E785 Hyperlipidemia, unspecified: Secondary | ICD-10-CM | POA: Diagnosis not present

## 2015-07-29 DIAGNOSIS — I129 Hypertensive chronic kidney disease with stage 1 through stage 4 chronic kidney disease, or unspecified chronic kidney disease: Secondary | ICD-10-CM | POA: Diagnosis not present

## 2015-07-29 DIAGNOSIS — I5033 Acute on chronic diastolic (congestive) heart failure: Secondary | ICD-10-CM | POA: Diagnosis not present

## 2015-07-29 DIAGNOSIS — E1129 Type 2 diabetes mellitus with other diabetic kidney complication: Secondary | ICD-10-CM | POA: Diagnosis not present

## 2015-07-29 DIAGNOSIS — F419 Anxiety disorder, unspecified: Secondary | ICD-10-CM | POA: Diagnosis not present

## 2015-07-29 DIAGNOSIS — N183 Chronic kidney disease, stage 3 (moderate): Secondary | ICD-10-CM | POA: Diagnosis not present

## 2015-07-29 DIAGNOSIS — K219 Gastro-esophageal reflux disease without esophagitis: Secondary | ICD-10-CM | POA: Diagnosis not present

## 2015-08-07 DIAGNOSIS — I10 Essential (primary) hypertension: Secondary | ICD-10-CM | POA: Diagnosis not present

## 2015-08-07 DIAGNOSIS — Z6837 Body mass index (BMI) 37.0-37.9, adult: Secondary | ICD-10-CM | POA: Diagnosis not present

## 2015-08-07 DIAGNOSIS — M1991 Primary osteoarthritis, unspecified site: Secondary | ICD-10-CM | POA: Diagnosis not present

## 2015-08-07 DIAGNOSIS — I509 Heart failure, unspecified: Secondary | ICD-10-CM | POA: Diagnosis not present

## 2015-08-07 DIAGNOSIS — R600 Localized edema: Secondary | ICD-10-CM | POA: Diagnosis not present

## 2015-08-07 DIAGNOSIS — Z1389 Encounter for screening for other disorder: Secondary | ICD-10-CM | POA: Diagnosis not present

## 2015-08-07 DIAGNOSIS — E119 Type 2 diabetes mellitus without complications: Secondary | ICD-10-CM | POA: Diagnosis not present

## 2015-08-16 DIAGNOSIS — Z1389 Encounter for screening for other disorder: Secondary | ICD-10-CM | POA: Diagnosis not present

## 2015-08-16 DIAGNOSIS — M25572 Pain in left ankle and joints of left foot: Secondary | ICD-10-CM | POA: Diagnosis not present

## 2015-08-16 DIAGNOSIS — Z6838 Body mass index (BMI) 38.0-38.9, adult: Secondary | ICD-10-CM | POA: Diagnosis not present

## 2015-08-16 DIAGNOSIS — Z23 Encounter for immunization: Secondary | ICD-10-CM | POA: Diagnosis not present

## 2015-08-21 ENCOUNTER — Ambulatory Visit (INDEPENDENT_AMBULATORY_CARE_PROVIDER_SITE_OTHER): Payer: Medicare Other | Admitting: Cardiovascular Disease

## 2015-08-21 ENCOUNTER — Encounter: Payer: Self-pay | Admitting: Cardiovascular Disease

## 2015-08-21 VITALS — BP 140/88 | HR 60 | Ht 75.0 in | Wt 308.1 lb

## 2015-08-21 DIAGNOSIS — I5033 Acute on chronic diastolic (congestive) heart failure: Secondary | ICD-10-CM | POA: Diagnosis not present

## 2015-08-21 DIAGNOSIS — E785 Hyperlipidemia, unspecified: Secondary | ICD-10-CM

## 2015-08-21 DIAGNOSIS — I2510 Atherosclerotic heart disease of native coronary artery without angina pectoris: Secondary | ICD-10-CM

## 2015-08-21 DIAGNOSIS — I440 Atrioventricular block, first degree: Secondary | ICD-10-CM

## 2015-08-21 DIAGNOSIS — I10 Essential (primary) hypertension: Secondary | ICD-10-CM

## 2015-08-21 DIAGNOSIS — Z79899 Other long term (current) drug therapy: Secondary | ICD-10-CM

## 2015-08-21 DIAGNOSIS — Z9861 Coronary angioplasty status: Secondary | ICD-10-CM

## 2015-08-21 DIAGNOSIS — R635 Abnormal weight gain: Secondary | ICD-10-CM | POA: Diagnosis not present

## 2015-08-21 DIAGNOSIS — N528 Other male erectile dysfunction: Secondary | ICD-10-CM

## 2015-08-21 MED ORDER — METOLAZONE 2.5 MG PO TABS: 2.5000 mg | ORAL_TABLET | Freq: Every day | ORAL | Status: DC

## 2015-08-21 MED ORDER — TORSEMIDE 20 MG PO TABS: 20.0000 mg | ORAL_TABLET | Freq: Two times a day (BID) | ORAL | Status: DC

## 2015-08-21 NOTE — Patient Instructions (Signed)
Your physician recommends that you schedule a follow-up appointment in: [place] Jory Sims, NP  STOP LASIX  START TORSEMIDE 20 MG TWO TIMES DAILY  TAKE METOLAZONE 2.5 MG Monday, Wednesday & Friday  Your physician recommends that you return for lab work ON FRIDAY  Thanks for choosing [facility]!!!

## 2015-08-21 NOTE — Progress Notes (Signed)
Patient ID: [name], male   DOB: 1946/04/19, 69 y.o.   MRN: [id number]      SUBJECTIVE: [name] presents for routine follow up. He has a history of hypertension and hypertensive heart disease as well as coronary artery disease and sustained a NSTEMI on 11/16/2012 and received a drug-eluting stent to the LAD and D1, with residual diffuse nonobstructive disease in the RCA and LCx.  He also has CKD, sleep apnea, obesity, and chronic diastolic heart failure.  Most recent echocardiogram ion 07/06/15 demonstrated normal left ventricular systolic function, EF 44-96%, and grade 2 diastolic dysfunction.  Hospitalized in July for acute on chronic diastolic heart failure. Followed up with Arnold Long NP on 07/17/15. Discharge wt 294 lbs. Baseline creatinine in 2 range.  He said his PCP started Lasix 40 mg tid. He feels torsemide worked better for him.  His weight is up to 308 lbs today. Denies chest pain and shortness of breath. Has leg swelling with diminished appetite and early satiety.  Has questions about penile pump.    Review of Systems: As per "subjective", otherwise negative.  Allergies  Allergen Reactions  . Niacin And Related Other (See Comments)    Reaction: flushing and burning side effects even with extended release    Current Outpatient Prescriptions  Medication Sig Dispense Refill  . acetaminophen (TYLENOL) 325 MG tablet Take 1-2 tablets (325-650 mg total) by mouth every 6 (six) hours as needed.    Marland Kitchen allopurinol (ZYLOPRIM) 300 MG tablet Take 300 mg by mouth every morning.     Marland Kitchen ALPRAZolam (XANAX) 0.5 MG tablet Take one tablet by mouth twice daily as needed for anxiety (Patient taking differently: Take 0.5 mg by mouth 3 (three) times daily. ) 60 tablet 5  . amLODipine (NORVASC) 5 MG tablet Take 1 tablet (5 mg total) by mouth daily. 90 tablet 3  . aspirin EC 81 MG tablet Take 81 mg by mouth daily.    . ferrous sulfate 325 (65 FE) MG tablet Take 1 tablet (325 mg total) by  mouth 3 (three) times daily after meals.  3  . furosemide (LASIX) 40 MG tablet Take 1 tablet (40 mg total) by mouth 2 (two) times daily. (Patient taking differently: Take 40 mg by mouth 3 (three) times daily. ) 60 tablet 0  . gabapentin (NEURONTIN) 300 MG capsule Take 300 mg by mouth 2 (two) times daily.     Marland Kitchen HYDROcodone-acetaminophen (NORCO) 10-325 MG per tablet Take 1 tablet by mouth 5 (five) times daily as needed for moderate pain or severe pain.    . metoprolol (LOPRESSOR) 50 MG tablet Take 0.5 tablets (25 mg total) by mouth 2 (two) times daily. 30 tablet 0  . NITROSTAT 0.4 MG SL tablet DISSOLVE ONE TABLET UNDER THE TONGUE AS NEEDED FOR CHEST PAIN. 25 tablet 1  . omeprazole (PRILOSEC) 20 MG capsule Take 20 mg by mouth every morning.     . pravastatin (PRAVACHOL) 20 MG tablet Take 20 mg by mouth every morning.     . saxagliptin HCl (ONGLYZA) 2.5 MG TABS tablet Take 2.5 mg by mouth every morning.    . torsemide (DEMADEX) 20 MG tablet Take 2 tablets (40 mg total) by mouth 2 (two) times daily. Start on 7/19 60 tablet 1   No current facility-administered medications for this visit.    Past Medical History  Diagnosis Date  . Diastolic heart failure     Pedal edema, "a long time ago"  . Hypertension  Exercise induced  . NSVT (nonsustained ventricular tachycardia)     Exercise induced  . 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 Obesity   . Degenerative joint disease   . Benign prostatic hypertrophy     Nocturia  . Erectile dysfunction   . High cholesterol   . CAD (coronary artery disease)     a.  NSTEMI 10/2012 s/p DES to LAD & DES to 1st diagonal with residual diffuse nonobstructive dz in LCx/RCA   . Chronic kidney disease, stage 3, mod decreased GFR     Creatinine of 1.5 in 03/2009  . Diabetes mellitus, type II 12/10/2012.  Marland Kitchen Anxiety   . Depression     hx of  . Flu 2013    hx of  . Hemorrhoids   . Tubular adenoma 2014  . GERD (gastroesophageal reflux  disease)     Past Surgical History  Procedure Laterality Date  . Cardiac catheterization      with stent placement  . Back surgery  2007    x2  . Hernia repair    . Total knee arthroplasty Right 11/15/2013    Procedure: RIGHT TOTAL KNEE ARTHROPLASTY;  Surgeon: Mauri Pole, MD;  Location: [facility];  Service: Orthopedics;  Laterality: Right;  . Colonoscopy with esophagogastroduodenoscopy (egd) N/A 11/23/2013    Dr. Oneida Alar- TCS= left sided colitis and mild proctitis, moderate internal hemorrhoids, small nodule in the rectum, bx=tubular adenoma, EGD=-mild non-erosive gastritis  . Left heart catheterization with coronary angiogram N/A 11/16/2012    Procedure: LEFT HEART CATHETERIZATION WITH CORONARY ANGIOGRAM;  Surgeon: Sherren Mocha, MD;  Location: [facility] CATH LAB;  Service: Cardiovascular;  Laterality: N/A;  . Percutaneous coronary stent intervention (pci-s)  11/16/2012    Procedure: PERCUTANEOUS CORONARY STENT INTERVENTION (PCI-S);  Surgeon: Sherren Mocha, MD;  Location: [facility] CATH LAB;  Service: Cardiovascular;;  . Total knee arthroplasty Left 03/07/2015    Procedure: LEFT TOTAL KNEE ARTHROPLASTY;  Surgeon: Paralee Cancel, MD;  Location: [facility];  Service: Orthopedics;  Laterality: Left;    Social History   Social History  . Marital Status: Widowed    Spouse Name: N/A  . Number of Children: N/A  . Years of Education: N/A   Occupational History  . Disabled    Social History Main Topics  . Smoking status: Former Smoker -- 1.00 packs/day for 30 years    Types: Cigarettes    Quit date: 12/23/1992  . Smokeless tobacco: Never Used  . Alcohol Use: No  . Drug Use: No  . Sexual Activity: Not on file   Other Topics Concern  . Not on file   Social History Narrative   Married     Filed Vitals:   08/21/15 1553  BP: 140/88  Pulse: 60  Height: 6' 3"  (1.905 m)  Weight: 308 lb 1.9 oz (139.762 kg)  SpO2: 96%    PHYSICAL EXAM General: NAD HEENT: Normal. Neck: No JVD, no  thyromegaly. Lungs: Clear to auscultation bilaterally with normal respiratory effort. CV: Nondisplaced PMI.  Regular rate and rhythm, normal S1/S2, no S3/S4, no murmur. 1+ pitting pretibial and periankle edema.  Abdomen: Soft, nontender, mild distention.  Neurologic: Alert and oriented.  Psych: Normal affect. Skin: Normal. Musculoskeletal: Normal range of motion, no gross deformities. Extremities: No clubbing or cyanosis.   ECG: Most recent ECG reviewed.      ASSESSMENT AND PLAN: 1. Acute on chronic diastolic heart failure: Will switch Lasix to torsemide 20 mg  bid with metolazone 2.5 mg every Mon, Wed, and Friday until his next appointment. Will check BMET this Friday.  2. CAD s/p PCI of LAD and D1: Stable ischemic heart disease. Continue aspirin 81 mg, metoprolol 25 mg twice daily, and pravastatin 20 mg daily.  3. Essential HTN: Borderline elevated today on current therapy which includes amlodipine 5 mg daily. Monitor given increase in diuretic.  4. Hyperlipidemia: Lipids in 09/2014 showed TC 122, TG 121, HDL 31, LDL 67. Continue pravastatin 20 mg.   5. Erectile dysfunction: I previously prescribed Viagra. Instructed not to be taken with nitrates. Told him to see a urologist about pump.  6. First degree AV block and bradycardia: Stable. No change to metoprolol dose.  Dispo: f/u in 10-12 days with NP.  Time spent: 40 minutes, of which greater than 50% was spent reviewing symptoms, relevant blood tests and studies, and discussing management plan with the patient.   Kate Sable, M.D., F.A.C.C.

## 2015-08-25 ENCOUNTER — Other Ambulatory Visit: Payer: Self-pay | Admitting: Cardiovascular Disease

## 2015-08-25 DIAGNOSIS — Z79899 Other long term (current) drug therapy: Secondary | ICD-10-CM | POA: Diagnosis not present

## 2015-08-26 LAB — BASIC METABOLIC PANEL
BUN: 47 mg/dL — ABNORMAL HIGH (ref 7–25)
CALCIUM: 9 mg/dL (ref 8.6–10.3)
CO2: 26 mmol/L (ref 20–31)
Chloride: 99 mmol/L (ref 98–110)
Creat: 2.24 mg/dL — ABNORMAL HIGH (ref 0.70–1.18)
Glucose, Bld: 89 mg/dL (ref 65–99)
Potassium: 4.6 mmol/L (ref 3.5–5.3)
Sodium: 139 mmol/L (ref 135–146)

## 2015-08-29 ENCOUNTER — Telehealth: Payer: Self-pay

## 2015-08-29 DIAGNOSIS — N183 Chronic kidney disease, stage 3 (moderate): Secondary | ICD-10-CM

## 2015-08-29 NOTE — Telephone Encounter (Signed)
Pt informed of labs,mailed lab slip for bmet

## 2015-08-31 NOTE — Progress Notes (Signed)
Cardiology Office Note   Date:  08/31/2015   ID:  [name], DOB [birth year], MRN [id number]  NO Show

## 2015-09-01 ENCOUNTER — Encounter: Payer: Self-pay | Admitting: Adult Health

## 2015-09-04 ENCOUNTER — Telehealth: Payer: Self-pay | Admitting: Cardiovascular Disease

## 2015-09-04 NOTE — Telephone Encounter (Signed)
Patient wants to know if he can Melatonin 64m. / tg

## 2015-09-04 NOTE — Telephone Encounter (Signed)
Sent to Dr. Harl Bowie

## 2015-09-05 NOTE — Telephone Encounter (Signed)
There is no reason from a heart standpoint he cannot use melatonin. I would suggest he check with his pcp as well  Zandra Abts MD

## 2015-09-05 NOTE — Telephone Encounter (Signed)
Spoke to patient & informed him that it was ok from a heart standpoint to take melatonin but did advise him to call his pcp for their input as well. PT voiced understanding and agreed to discuss it with PCP

## 2015-09-06 ENCOUNTER — Telehealth: Payer: Self-pay | Admitting: Cardiovascular Disease

## 2015-09-06 NOTE — Telephone Encounter (Signed)
Patient states that he had a 3 pound weight gain over night. Patient denies SOB at this time. States he is taking Torsemide as prescribed. Please advise.

## 2015-09-06 NOTE — Telephone Encounter (Signed)
Pt states he is retaining fluid

## 2015-09-07 NOTE — Telephone Encounter (Signed)
Should be toresmide 36m in AM and 214min PM for next 3 days then resume 2031mid. Contact us [place] on Monday.   J BZandra Abts

## 2015-09-07 NOTE — Telephone Encounter (Signed)
Please clarify how the patient is taking his torsemide and metolazone. I would recommened taking an additional 60m of torsemide with his morning dose for the next 3 days or until his weight comes back down. Have him up update u[place]oreaon symptoms on Monday.  JZandra AbtsMD

## 2015-09-07 NOTE — Telephone Encounter (Signed)
PT is taking 20 mg of torsemide 2  Times daily. He is not taking metolazone at this time. Last time it was prescribed he was only given 3 pills to take that won,wed, & fri. which he did.  I advised him to take an additional 20 mg of torsemide with his morning dose & call us back on Monday to let us know how his weight is doing.

## 2015-09-07 NOTE — Telephone Encounter (Signed)
PT notified & made aware to take 40 mg of torsemide in the AM & 20 mg in the PM for the next 3 days- and then resume 20 mg two times daily after that. He has an appointment with Jory Sims on  9/19, where we will check his weight.

## 2015-09-11 ENCOUNTER — Encounter: Payer: Self-pay | Admitting: Adult Health

## 2015-09-11 ENCOUNTER — Ambulatory Visit (INDEPENDENT_AMBULATORY_CARE_PROVIDER_SITE_OTHER): Payer: Medicare Other | Admitting: Adult Health

## 2015-09-11 VITALS — BP 132/68 | HR 56 | Ht 75.0 in | Wt 308.0 lb

## 2015-09-11 DIAGNOSIS — I5032 Chronic diastolic (congestive) heart failure: Secondary | ICD-10-CM | POA: Diagnosis not present

## 2015-09-11 DIAGNOSIS — Z79899 Other long term (current) drug therapy: Secondary | ICD-10-CM | POA: Diagnosis not present

## 2015-09-11 DIAGNOSIS — I2510 Atherosclerotic heart disease of native coronary artery without angina pectoris: Secondary | ICD-10-CM

## 2015-09-11 DIAGNOSIS — I10 Essential (primary) hypertension: Secondary | ICD-10-CM

## 2015-09-11 DIAGNOSIS — N183 Chronic kidney disease, stage 3 (moderate): Secondary | ICD-10-CM | POA: Diagnosis not present

## 2015-09-11 NOTE — Progress Notes (Signed)
Cardiology Office Note   Date:  09/11/2015   ID:  [name], DOB Jan 26, 1946, MRN [id number]  PCP:  Collene Mares, PA-C  Cardiologist: Woodroe Chen, NP   Chief Complaint  Patient presents with  . Congestive Heart Failure  . Coronary Artery Disease      History of Present Illness: [name] is a 69 y.o. male who presents for hypertension and hypertensive heart disease as well as coronary artery disease and sustained a NSTEMI on 11/16/2012 and received a drug-eluting stent to the LAD and D1, with residual diffuse nonobstructive disease in the RCA and LCx. He also has CKD, sleep apnea, obesity, and chronic diastolic heart failure.  Most recent echocardiogram ion 07/06/15 demonstrated normal left ventricular systolic function, EF 19-62%, and grade 2 diastolic dysfunction.  At last office visit, lasix was switched to torsemide 20 mg BID with metolazone 2.5 mg MWF.  He was to take torsemide 40 mg in am and 20 mg pm for three days and then resume 20 mg BID.   Follow up labs: Na 139; K+ 4.6, Chloride 99, BUN 47, Creatinine 2.24.   He comes today with complaints of weight gain and lower extremity edema.  He states he is up approximately 10-15 pounds since discharge weight back in July and 2016.  He denies eating salty foods or eating foods that cause him to gain weight.  He has not yet followed up with a nephrologist in order to assist with kidney function.  The patient has not been on metolazone, due to worsening kidney function. He only had 2 pills provided for him in the beginning.  There have been no refills. He has been taking torsemide 60 mg daily in divided doses of 40 in a.m. And 20 in the p.m.  He is frustrated with the weight gain.  Past Medical History  Diagnosis Date  . Diastolic heart failure     Pedal edema, "a long time ago"  . Hypertension     Exercise induced  . NSVT (nonsustained ventricular tachycardia)     Exercise induced  . Tobacco abuse, in  remission     30 pack years, quit 1994  . Obesity   . Degenerative joint disease   . Benign prostatic hypertrophy     Nocturia  . Erectile dysfunction   . High cholesterol   . CAD (coronary artery disease)     a.  NSTEMI 10/2012 s/p DES to LAD & DES to 1st diagonal with residual diffuse nonobstructive dz in LCx/RCA   . Chronic kidney disease, stage 3, mod decreased GFR     Creatinine of 1.5 in 03/2009  . Diabetes mellitus, type II 12/10/2012.  Marland Kitchen Anxiety   . Depression     hx of  . Flu 2013    hx of  . Hemorrhoids   . Tubular adenoma 2014  . GERD (gastroesophageal reflux disease)     Past Surgical History  Procedure Laterality Date  . Cardiac catheterization      with stent placement  . Back surgery  2007    x2  . Hernia repair    . Total knee arthroplasty Right 11/15/2013    Procedure: RIGHT TOTAL KNEE ARTHROPLASTY;  Surgeon: Mauri Pole, MD;  Location: [facility];  Service: Orthopedics;  Laterality: Right;  . Colonoscopy with esophagogastroduodenoscopy (egd) N/A 11/23/2013    Dr. Oneida Alar- TCS= left sided colitis and mild proctitis, moderate internal hemorrhoids, small nodule in the rectum, bx=tubular adenoma, EGD=-mild non-erosive gastritis  .  Left heart catheterization with coronary angiogram N/A 11/16/2012    Procedure: LEFT HEART CATHETERIZATION WITH CORONARY ANGIOGRAM;  Surgeon: Sherren Mocha, MD;  Location: [facility] C[facility] LAB;  Service: Cardiovascular;  Laterality: N/A;  . Percutaneous coronary stent intervention (pci-s)  11/16/2012    Procedure: PERCUTANEOUS CORONARY STENT INTERVENTION (PCI-S);  Surgeon: Sherren Mocha, MD;  Location: [facility] CATH LAB;  Service: Cardiovascular;;  . Total knee arthroplasty Left 03/07/2015    Procedure: LEFT TOTAL KNEE ARTHROPLASTY;  Surgeon: Paralee Cancel, MD;  Location: [facility];  Service: Orthopedics;  Laterality: Left;     Current Outpatient Prescriptions  Medication Sig Dispense Refill  . acetaminophen (TYLENOL) 325 MG tablet Take 1-2 tablets  (325-650 mg total) by mouth every 6 (six) hours as needed.    Marland Kitchen allopurinol (ZYLOPRIM) 300 MG tablet Take 300 mg by mouth every morning.     Marland Kitchen ALPRAZolam (XANAX) 0.5 MG tablet Take one tablet by mouth twice daily as needed for anxiety (Patient taking differently: Take 0.5 mg by mouth 3 (three) times daily. ) 60 tablet 5  . amLODipine (NORVASC) 5 MG tablet Take 1 tablet (5 mg total) by mouth daily. 90 tablet 3  . aspirin EC 81 MG tablet Take 81 mg by mouth daily.    . ferrous sulfate 325 (65 FE) MG tablet Take 1 tablet (325 mg total) by mouth 3 (three) times daily after meals.  3  . gabapentin (NEURONTIN) 300 MG capsule Take 300 mg by mouth 2 (two) times daily.     Marland Kitchen HYDROcodone-acetaminophen (NORCO) 10-325 MG per tablet Take 1 tablet by mouth 5 (five) times daily as needed for moderate pain or severe pain.    . metolazone (ZAROXOLYN) 2.5 MG tablet Take 1 tablet (2.5 mg total) by mouth daily. 3 tablet 0  . metoprolol (LOPRESSOR) 50 MG tablet Take 0.5 tablets (25 mg total) by mouth 2 (two) times daily. 30 tablet 0  . NITROSTAT 0.4 MG SL tablet DISSOLVE ONE TABLET UNDER THE TONGUE AS NEEDED FOR CHEST PAIN. 25 tablet 1  . omeprazole (PRILOSEC) 20 MG capsule Take 20 mg by mouth every morning.     . pravastatin (PRAVACHOL) 20 MG tablet Take 20 mg by mouth every morning.     . saxagliptin HCl (ONGLYZA) 2.5 MG TABS tablet Take 2.5 mg by mouth every morning.    . torsemide (DEMADEX) 20 MG tablet Take 2 tablets (40 mg total) by mouth 2 (two) times daily. Start on 7/19 60 tablet 1  . torsemide (DEMADEX) 20 MG tablet Take 1 tablet (20 mg total) by mouth 2 (two) times daily. 180 tablet 3   No current facility-administered medications for this visit.    Allergies:   Niacin and related    Social History:  The patient  reports that he quit smoking about 22 years ago. His smoking use included Cigarettes. He has a 30 pack-year smoking history. He has never used smokeless tobacco. He reports that he does not  drink alcohol or use illicit drugs.   Family History:  The patient's family history includes Cancer in his other; Diabetes in an other family member; Heart disease in an other family member; Hypertension in his other. There is no history of Colon cancer.    ROS: All other systems are reviewed and negative. Unless otherwise mentioned in H&P    PHYSICAL EXAM: VS:  BP 132/68 mmHg  Pulse 56  Ht 6' 3"  (1.905 m)  Wt 308 lb (139.708 kg)  BMI 38.50 kg/m2  SpO2  97% , BMI Body mass index is 38.5 kg/(m^2). GEN: Well nourished, well developed, in no acute distress HEENT: normal Neck: no JVD, carotid bruits, or masses Cardiac: RRR; no murmurs, rubs, or gallops,no edema  Respiratory:  clear to auscultation bilaterally, normal work of breathing GI: soft, nontender, nondistended, + BS MS: no deformity or atrophy1+ edema in the lower extremities at the pretibial area and 2+ in the ankles. Skin: warm and dry, no rash Neuro:  Strength and sensation are intact Psych: euthymic mood, full affect   Recent Labs: 07/04/2015: Magnesium 1.7 07/05/2015: TSH 1.725 07/24/2015: Hemoglobin 10.7*; Platelets 193 08/25/2015: BUN 47*; Creat 2.24*; Potassium 4.6; Sodium 139    Lipid Panel    Component Value Date/Time   CHOL 121 11/15/2012 0625   TRIG 57 11/15/2012 0625   HDL 36* 11/15/2012 0625   CHOLHDL 3.4 11/15/2012 0625   VLDL 11 11/15/2012 0625   LDLCALC 74 11/15/2012 0625      Wt Readings from Last 3 Encounters:  09/11/15 308 lb (139.708 kg)  08/21/15 308 lb 1.9 oz (139.762 kg)  07/24/15 308 lb (139.708 kg)      Other studies Reviewed: Additional studies/ records that were reviewed today include: discharge weight in July of 2016 Review of the above records demonstrates:weight 296 pounds.   ASSESSMENT AND PLAN:  1. Acute on chronic diastolic CHF.he is frustrated with weight gain.  States he works out every day eats fruits and vegetables and does not eat any foods with salt.  He states he has  noticed a 10-12 pound weight gain since July of 2016.  He has been on increased doses of Demadex without results and weight loss.  He is adamant that he has gained fluid weight.  On assessment.  He does have some lower extremity edema, but no evidence of abdominal distention, edema in the thighs, or around knees.  I will increase his torsemide to 60 mg in the morning and 20 mg in the evening.  For the next 2-3 days to see if this helps.  If he has not had any weight loss.  He is to call us back.  I am reluctant to put him on metolazone with renal insufficiency, with last creatinine of 2.3.  We will discuss this further with Dr. Bronson Ing for his thoughts on the matter.   2. Chronic kidney disease: he was to be referred to nephrology on discharge, that has not heard from them.  We will re-refer him.  3.CAD: He has drug-eluting stent to the LAD, diagonal 1, with diffuse disease in the right coronary artery.  He denies any chest pain or dyspnea.he works out at the [employer] to 4 times a week.  He has not had to take nitroglycerin.  He will continue on metoprolol, ACE inhibitor, isosorbide.  Uncertain at this time why he is not on a statin.  I see no contraindications.  Labs are followed by his primary care physician.  Perhaps need for fasting lipids and LFTs are warranted. These will be ordered.  Current medicines are reviewed at length with the patient today.    Labs/ tests ordered today include: BMET in one week. Fasting lipids and LFTs  Orders Placed This Encounter  Procedures  . Basic Metabolic Panel (BMET)  . Ambulatory referral to Nephrology     Disposition:   FU with 3 months with Dr. Bronson Ing. Signed, Jory Sims, NP  09/11/2015 4:17 PM    [facility] [address] [place],  [place] [address] Phone: [phone]; Fax: [phone]

## 2015-09-11 NOTE — Patient Instructions (Signed)
Your physician recommends that you schedule a follow-up appointment in: 1 MONTH WITH DR. Bronson Ing  TAKE TORSEMIDE 40 MG IN THE AM / 40 MG IN THE PM FOR NEXT 2 DAYS- TUESDAY & WEDNEDSDAY On Thursday GO BACK TO 20 IN THE am / 20 in the pm  You have been referred to DR. BEFEKADU (KIDNEY DOCTOR)  Your physician recommends that you return for lab work in: [place] - Monday September 26TH  BMET  Thanks for choosing [facility]!!!

## 2015-09-11 NOTE — Progress Notes (Deleted)
Name: [name]    DOB: Oct 26, 1946  Age: 69 y.o.  MR#: [id number]       PCP:  Collene Mares, PA-C      Insurance: Payor: [name] MEDICARE / Plan: [employer] MEDICARE / Product Type: *No Product type* /   CC:    Chief Complaint  Patient presents with  . Congestive Heart Failure  . Coronary Artery Disease    VS Filed Vitals:   09/11/15 1340  BP: 132/68  Pulse: 56  Height: 6' 3"  (1.905 m)  Weight: 308 lb (139.708 kg)  SpO2: 97%    Weights Current Weight  09/11/15 308 lb (139.708 kg)  08/21/15 308 lb 1.9 oz (139.762 kg)  07/24/15 308 lb (139.708 kg)    Blood Pressure  BP Readings from Last 3 Encounters:  09/11/15 132/68  08/21/15 140/88  07/24/15 151/72     Admit date:  (Not on file) Last encounter with RMR:  07/17/2015   Allergy Niacin and related  Current Outpatient Prescriptions  Medication Sig Dispense Refill  . acetaminophen (TYLENOL) 325 MG tablet Take 1-2 tablets (325-650 mg total) by mouth every 6 (six) hours as needed.    Marland Kitchen allopurinol (ZYLOPRIM) 300 MG tablet Take 300 mg by mouth every morning.     Marland Kitchen ALPRAZolam (XANAX) 0.5 MG tablet Take one tablet by mouth twice daily as needed for anxiety (Patient taking differently: Take 0.5 mg by mouth 3 (three) times daily. ) 60 tablet 5  . amLODipine (NORVASC) 5 MG tablet Take 1 tablet (5 mg total) by mouth daily. 90 tablet 3  . aspirin EC 81 MG tablet Take 81 mg by mouth daily.    . ferrous sulfate 325 (65 FE) MG tablet Take 1 tablet (325 mg total) by mouth 3 (three) times daily after meals.  3  . gabapentin (NEURONTIN) 300 MG capsule Take 300 mg by mouth 2 (two) times daily.     Marland Kitchen HYDROcodone-acetaminophen (NORCO) 10-325 MG per tablet Take 1 tablet by mouth 5 (five) times daily as needed for moderate pain or severe pain.    . metolazone (ZAROXOLYN) 2.5 MG tablet Take 1 tablet (2.5 mg total) by mouth daily. 3 tablet 0  . metoprolol (LOPRESSOR) 50 MG tablet Take 0.5 tablets (25 mg total) by mouth 2 (two) times  daily. 30 tablet 0  . NITROSTAT 0.4 MG SL tablet DISSOLVE ONE TABLET UNDER THE TONGUE AS NEEDED FOR CHEST PAIN. 25 tablet 1  . omeprazole (PRILOSEC) 20 MG capsule Take 20 mg by mouth every morning.     . pravastatin (PRAVACHOL) 20 MG tablet Take 20 mg by mouth every morning.     . saxagliptin HCl (ONGLYZA) 2.5 MG TABS tablet Take 2.5 mg by mouth every morning.    . torsemide (DEMADEX) 20 MG tablet Take 2 tablets (40 mg total) by mouth 2 (two) times daily. Start on 7/19 60 tablet 1  . torsemide (DEMADEX) 20 MG tablet Take 1 tablet (20 mg total) by mouth 2 (two) times daily. 180 tablet 3   No current facility-administered medications for this visit.    Discontinued Meds:    Medications Discontinued During This Encounter  Medication Reason  . amLODipine (NORVASC) 10 MG tablet Error    Patient Active Problem List   Diagnosis Date Noted  . Peripheral edema   . CHF (congestive heart failure) 07/05/2015  . Acute congestive heart failure 07/04/2015  . Acute on chronic diastolic congestive heart failure 07/04/2015  . HLD (hyperlipidemia) 07/04/2015  .  Anxiety state 07/04/2015  . GERD (gastroesophageal reflux disease) 07/04/2015  . Heart block 07/04/2015  . Edema 03/11/2015  . Diastolic CHF, chronic 83/41/9622  . 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 Muscle weakness (generalized) 01/13/2014  . Knee stiffness 01/13/2014  . Gout 12/11/2013  . Cellulitis 11/19/2013  . Expected blood loss anemia 11/17/2013  . Obese 11/17/2013  . S/P right TKA 11/15/2013  . Anemia, normocytic normochromic 01/12/2013  . Diabetes mellitus 12/10/2012  . 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 Overweight(278.02) 11/17/2012  . Obstructive sleep apnea 11/15/2012  . Chronic kidney disease, stage 3, cardiorenal syndrome 08/23/2011  . Hypertension     LABS    Component Value Date/Time   NA 139 08/25/2015 1639   NA 139 07/24/2015 1800   NA 139 07/17/2015 1620   K 4.6 08/25/2015 1639   K 4.5  07/24/2015 1800   K 4.6 07/17/2015 1620   CL 99 08/25/2015 1639   CL 106 07/24/2015 1800   CL 100* 07/17/2015 1620   CO2 26 08/25/2015 1639   CO2 25 07/24/2015 1800   CO2 26 07/17/2015 1620   GLUCOSE 89 08/25/2015 1639   GLUCOSE 107* 07/24/2015 1800   GLUCOSE 128* 07/17/2015 1620   BUN 47* 08/25/2015 1639   BUN 59* 07/24/2015 1800   BUN 111* 07/17/2015 1620   CREATININE 2.24* 08/25/2015 1639   CREATININE 2.25* 07/24/2015 1800   CREATININE 3.58* 07/17/2015 1620   CREATININE 2.50* 07/10/2015 0647   CREATININE 1.48* 01/07/2013 1545   CREATININE 2.69* 11/24/2012 1442   CALCIUM 9.0 08/25/2015 1639   CALCIUM 8.8* 07/24/2015 1800   CALCIUM 9.4 07/17/2015 1620   GFRNONAA 28* 07/24/2015 1800   GFRNONAA 16* 07/17/2015 1620   GFRNONAA 25* 07/10/2015 0647   GFRAA 32* 07/24/2015 1800   GFRAA 18* 07/17/2015 1620   GFRAA 28* 07/10/2015 0647   CMP     Component Value Date/Time   NA 139 08/25/2015 1639   K 4.6 08/25/2015 1639   CL 99 08/25/2015 1639   CO2 26 08/25/2015 1639   GLUCOSE 89 08/25/2015 1639   BUN 47* 08/25/2015 1639   CREATININE 2.24* 08/25/2015 1639   CREATININE 2.25* 07/24/2015 1800   CALCIUM 9.0 08/25/2015 1639   PROT 6.5 11/20/2013 0601   ALBUMIN 2.2* 11/20/2013 0601   AST 13 11/20/2013 0601   ALT 7 11/20/2013 0601   ALKPHOS 59 11/20/2013 0601   BILITOT 0.8 11/20/2013 0601   GFRNONAA 28* 07/24/2015 1800   GFRAA 32* 07/24/2015 1800       Component Value Date/Time   WBC 5.2 07/24/2015 1800   WBC 5.1 07/04/2015 1932   WBC 5.9 03/22/2015 0735   HGB 10.7* 07/24/2015 1800   HGB 11.8* 07/04/2015 1932   HGB 8.1* 03/22/2015 0735   HCT 32.6* 07/24/2015 1800   HCT 36.5* 07/04/2015 1932   HCT 25.3* 03/22/2015 0735   MCV 98.5 07/24/2015 1800   MCV 98.9 07/04/2015 1932   MCV 100.8* 03/22/2015 0735    Lipid Panel     Component Value Date/Time   CHOL 121 11/15/2012 0625   TRIG 57 11/15/2012 0625   HDL 36* 11/15/2012 0625   CHOLHDL 3.4 11/15/2012 0625   VLDL 11  11/15/2012 0625   LDLCALC 74 11/15/2012 0625    ABG No results found for: PHART, PCO2ART, PO2ART, HCO3, TCO2, ACIDBASEDEF, O2SAT   Lab Results  Component Value Date   TSH 1.725 07/05/2015   BNP (last 3 results) No results for input(s): BNP in  the last 8760 hours.  ProBNP (last 3 results) No results for input(s): PROBNP in the last 8760 hours.  Cardiac Panel (last 3 results) No results for input(s): CKTOTAL, CKMB, TROPONINI, RELINDX in the last 72 hours.  Iron/TIBC/Ferritin/ %Sat    Component Value Date/Time   IRON 13* 11/20/2013 1038   TIBC 124* 11/20/2013 1038   FERRITIN 809* 11/22/2013 1219   IRONPCTSAT 10* 11/20/2013 1038     EKG Orders placed or performed during the hospital encounter of 07/04/15  . 12 lead EKG  . 12 lead EKG  . EKG 12-Lead  . EKG 12-Lead     Prior Assessment and Plan Problem List as of 09/11/2015      Cardiovascular and Mediastinum   Hypertension   Last Assessment & Plan 06/15/2013 Office Visit Edited 06/17/2013 10:52 PM by Yehuda Savannah, MD    Blood pressure control is good with current medication, which will be continued.      Arteriosclerotic cardiovascular disease (ASCVD)   Last Assessment & Plan 06/15/2013 Office Visit Written 06/15/2013  3:14 PM by Yehuda Savannah, MD    Mr. Mapel has been stable from a cardiac standpoint since intervention for an acute myocardial infarction 7 months ago. He is willing to wait to complete a a one-year course of dual antiplatelet therapy prior to undergoing TKA.      Diastolic CHF, chronic   Acute congestive heart failure   Acute on chronic diastolic congestive heart failure   Heart block   CHF (congestive heart failure)     Respiratory   Obstructive sleep apnea     Digestive   GERD (gastroesophageal reflux disease)     Endocrine   Diabetes mellitus   Last Assessment & Plan 02/08/2013 Office Visit Edited 02/11/2013  8:15 PM by Yehuda Savannah, MD    Patient denies that he has diabetes,  believing that previous elevations in CBGs and hemoglobin A1c have been entirely related to physiologic stress. He has decreased his dose of metformin unilaterally, but reports blood glucose values typically less than 120. A repeat hemoglobin A1c level will be obtained.        Genitourinary   Chronic kidney disease, stage 3, cardiorenal syndrome   Last Assessment & Plan 06/28/2013 Telephone Written 06/29/2013 10:31 AM by Yehuda Savannah, MD    Continued furosemide 40 mg per day; Bmet in 2 weeks.        Other   Overweight(278.02)   Last Assessment & Plan 06/15/2013 Office Visit Written 06/15/2013  3:17 PM by Yehuda Savannah, MD    Patient congratulated on weight loss and encouraged to continue caloric restriction. The benefits in terms of management of his current DJD and promoting long-term good function of his planned prosthetic knees were emphasized.      Anemia, normocytic normochromic   Last Assessment & Plan 06/15/2013 Office Visit Written 06/15/2013  3:14 PM by Yehuda Savannah, MD    New leukocytosis, possibly related to acute gout.  Persistent anemia, likely related to chronic kidney disease. Patient is not symptomatic and does not appear to require medication to stimulate erythropoiesis..      S/P right TKA   Expected blood loss anemia   Obese   Cellulitis   Gout   Muscle weakness (generalized)   Knee stiffness   S/P left TKA   Edema   HLD (hyperlipidemia)   Anxiety state   Peripheral edema       Imaging: No results found.

## 2015-09-14 DIAGNOSIS — K529 Noninfective gastroenteritis and colitis, unspecified: Secondary | ICD-10-CM | POA: Diagnosis not present

## 2015-09-14 DIAGNOSIS — Z6838 Body mass index (BMI) 38.0-38.9, adult: Secondary | ICD-10-CM | POA: Diagnosis not present

## 2015-09-14 DIAGNOSIS — Z1389 Encounter for screening for other disorder: Secondary | ICD-10-CM | POA: Diagnosis not present

## 2015-09-18 DIAGNOSIS — K529 Noninfective gastroenteritis and colitis, unspecified: Secondary | ICD-10-CM | POA: Diagnosis not present

## 2015-09-18 DIAGNOSIS — Z6838 Body mass index (BMI) 38.0-38.9, adult: Secondary | ICD-10-CM | POA: Diagnosis not present

## 2015-09-18 DIAGNOSIS — Z1389 Encounter for screening for other disorder: Secondary | ICD-10-CM | POA: Diagnosis not present

## 2015-09-18 DIAGNOSIS — Z79899 Other long term (current) drug therapy: Secondary | ICD-10-CM | POA: Diagnosis not present

## 2015-09-19 LAB — BASIC METABOLIC PANEL
BUN: 43 mg/dL — ABNORMAL HIGH (ref 7–25)
CO2: 27 mmol/L (ref 20–31)
Calcium: 8.9 mg/dL (ref 8.6–10.3)
Chloride: 103 mmol/L (ref 98–110)
Creat: 2.49 mg/dL — ABNORMAL HIGH (ref 0.70–1.18)
GLUCOSE: 121 mg/dL — AB (ref 65–99)
Potassium: 5.1 mmol/L (ref 3.5–5.3)
SODIUM: 143 mmol/L (ref 135–146)

## 2015-09-19 NOTE — Telephone Encounter (Signed)
-----   Message from Lendon Colonel, NP sent at 09/19/2015 10:39 AM EDT ----- Creatinine rising. I have reviewed his mediations. He should not be on metolazone. He was not given refills on this on last visit. He is due to see nephrology as well. For now, please keep him on torsemide 20 mg BID. He should be on low sodium diet.

## 2015-09-19 NOTE — Telephone Encounter (Signed)
PT made aware- He let me know he has an appt with the kidney doctor on Oct 3. He states that he is currently taking torsemide 20 mg BID- and is following a low sodium diet.

## 2015-09-25 DIAGNOSIS — I509 Heart failure, unspecified: Secondary | ICD-10-CM | POA: Diagnosis not present

## 2015-09-25 DIAGNOSIS — M109 Gout, unspecified: Secondary | ICD-10-CM | POA: Diagnosis not present

## 2015-09-25 DIAGNOSIS — N183 Chronic kidney disease, stage 3 (moderate): Secondary | ICD-10-CM | POA: Diagnosis not present

## 2015-09-25 DIAGNOSIS — E1129 Type 2 diabetes mellitus with other diabetic kidney complication: Secondary | ICD-10-CM | POA: Diagnosis not present

## 2015-10-09 ENCOUNTER — Other Ambulatory Visit (HOSPITAL_COMMUNITY): Payer: Self-pay | Admitting: Nephrology

## 2015-10-09 DIAGNOSIS — N289 Disorder of kidney and ureter, unspecified: Secondary | ICD-10-CM

## 2015-10-13 DIAGNOSIS — I7389 Other specified peripheral vascular diseases: Secondary | ICD-10-CM | POA: Diagnosis not present

## 2015-10-13 DIAGNOSIS — N184 Chronic kidney disease, stage 4 (severe): Secondary | ICD-10-CM | POA: Diagnosis not present

## 2015-10-13 DIAGNOSIS — Z6838 Body mass index (BMI) 38.0-38.9, adult: Secondary | ICD-10-CM | POA: Diagnosis not present

## 2015-10-13 DIAGNOSIS — Z1389 Encounter for screening for other disorder: Secondary | ICD-10-CM | POA: Diagnosis not present

## 2015-10-13 DIAGNOSIS — E1140 Type 2 diabetes mellitus with diabetic neuropathy, unspecified: Secondary | ICD-10-CM | POA: Diagnosis not present

## 2015-10-13 DIAGNOSIS — M1991 Primary osteoarthritis, unspecified site: Secondary | ICD-10-CM | POA: Diagnosis not present

## 2015-10-13 DIAGNOSIS — E1129 Type 2 diabetes mellitus with other diabetic kidney complication: Secondary | ICD-10-CM | POA: Diagnosis not present

## 2015-10-23 ENCOUNTER — Ambulatory Visit (INDEPENDENT_AMBULATORY_CARE_PROVIDER_SITE_OTHER): Payer: Medicare Other | Admitting: Cardiovascular Disease

## 2015-10-23 ENCOUNTER — Ambulatory Visit: Payer: Self-pay | Admitting: Cardiovascular Disease

## 2015-10-23 ENCOUNTER — Encounter: Payer: Self-pay | Admitting: Cardiovascular Disease

## 2015-10-23 VITALS — BP 128/80 | HR 50 | Ht 74.0 in | Wt 301.0 lb

## 2015-10-23 DIAGNOSIS — I252 Old myocardial infarction: Secondary | ICD-10-CM

## 2015-10-23 DIAGNOSIS — E785 Hyperlipidemia, unspecified: Secondary | ICD-10-CM

## 2015-10-23 DIAGNOSIS — Z955 Presence of coronary angioplasty implant and graft: Secondary | ICD-10-CM

## 2015-10-23 DIAGNOSIS — I440 Atrioventricular block, first degree: Secondary | ICD-10-CM

## 2015-10-23 DIAGNOSIS — I2510 Atherosclerotic heart disease of native coronary artery without angina pectoris: Secondary | ICD-10-CM

## 2015-10-23 DIAGNOSIS — I5032 Chronic diastolic (congestive) heart failure: Secondary | ICD-10-CM | POA: Diagnosis not present

## 2015-10-23 DIAGNOSIS — Z79899 Other long term (current) drug therapy: Secondary | ICD-10-CM

## 2015-10-23 DIAGNOSIS — N183 Chronic kidney disease, stage 3 (moderate): Secondary | ICD-10-CM | POA: Diagnosis not present

## 2015-10-23 DIAGNOSIS — I10 Essential (primary) hypertension: Secondary | ICD-10-CM | POA: Diagnosis not present

## 2015-10-23 NOTE — Progress Notes (Signed)
Patient ID: JAHDEN SCHARA, male   DOB: 1946/11/28, 69 y.o.   MRN: [id number]      SUBJECTIVE: Mr. Croghan presents for routine follow up. He has a history of hypertension and hypertensive heart disease as well as coronary artery disease and sustained a NSTEMI on 11/16/2012 and received a drug-eluting stent to the LAD and D1, with residual diffuse nonobstructive disease in the RCA and LCx.  He also has CKD, sleep apnea, obesity, and chronic diastolic heart failure.  Most recent echocardiogram ion 07/06/15 demonstrated normal left ventricular systolic function, EF 40-81%, and grade 2 diastolic dysfunction.  He is doing well and denies chest pain and shortness of breath. Follows with nephrology.  Review of Systems: As per "subjective", otherwise negative.  Allergies  Allergen Reactions  . Niacin And Related Other (See Comments)    Reaction: flushing and burning side effects even with extended release    Current Outpatient Prescriptions  Medication Sig Dispense Refill  . acetaminophen (TYLENOL) 325 MG tablet Take 1-2 tablets (325-650 mg total) by mouth every 6 (six) hours as needed.    Marland Kitchen allopurinol (ZYLOPRIM) 300 MG tablet Take 300 mg by mouth every morning.     Marland Kitchen ALPRAZolam (XANAX) 0.5 MG tablet Take one tablet by mouth twice daily as needed for anxiety (Patient taking differently: Take 0.5 mg by mouth 3 (three) times daily. ) 60 tablet 5  . amLODipine (NORVASC) 5 MG tablet Take 1 tablet (5 mg total) by mouth daily. 90 tablet 3  . aspirin EC 81 MG tablet Take 81 mg by mouth daily.    . ferrous sulfate 325 (65 FE) MG tablet Take 1 tablet (325 mg total) by mouth 3 (three) times daily after meals.  3  . gabapentin (NEURONTIN) 300 MG capsule Take 300 mg by mouth 2 (two) times daily.     Marland Kitchen HYDROcodone-acetaminophen (NORCO) 10-325 MG per tablet Take 1 tablet by mouth 5 (five) times daily as needed for moderate pain or severe pain.    . metoprolol (LOPRESSOR) 50 MG tablet Take 0.5 tablets (25 mg  total) by mouth 2 (two) times daily. 30 tablet 0  . NITROSTAT 0.4 MG SL tablet DISSOLVE ONE TABLET UNDER THE TONGUE AS NEEDED FOR CHEST PAIN. 25 tablet 1  . omeprazole (PRILOSEC) 20 MG capsule Take 20 mg by mouth every morning.     . pravastatin (PRAVACHOL) 20 MG tablet Take 20 mg by mouth every morning.     . saxagliptin HCl (ONGLYZA) 2.5 MG TABS tablet Take 2.5 mg by mouth every morning.    . torsemide (DEMADEX) 20 MG tablet Take 2 tablets (40 mg total) by mouth 2 (two) times daily. Start on 7/19 60 tablet 1   No current facility-administered medications for this visit.    Past Medical History  Diagnosis Date  . Diastolic heart failure     Pedal edema, "a long time ago"  . Hypertension     Exercise induced  . NSVT (nonsustained ventricular tachycardia) (HCC)     Exercise induced  . 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 Obesity   . Degenerative joint disease   . Benign prostatic hypertrophy     Nocturia  . Erectile dysfunction   . High cholesterol   . CAD (coronary artery disease)     a.  NSTEMI 10/2012 s/p DES to LAD & DES to 1st diagonal with residual diffuse nonobstructive dz in LCx/RCA   . Chronic kidney  disease, stage 3, mod decreased GFR     Creatinine of 1.5 in 03/2009  . Diabetes mellitus, type II ([place]) 12/10/2012.  Marland Kitchen Anxiety   . Depression     hx of  . Flu 2013    hx of  . Hemorrhoids   . Tubular adenoma 2014  . GERD (gastroesophageal reflux disease)     Past Surgical History  Procedure Laterality Date  . Cardiac catheterization      with stent placement  . Back surgery  2007    x2  . Hernia repair    . Total knee arthroplasty Right 11/15/2013    Procedure: RIGHT TOTAL KNEE ARTHROPLASTY;  Surgeon: Mauri Pole, MD;  Location: [facility] ORS;  Service: Orthopedics;  Laterality: Right;  . Colonoscopy with esophagogastroduodenoscopy (egd) N/A 11/23/2013    Dr. Oneida Alar- TCS= left sided colitis and mild proctitis, moderate internal hemorrhoids,  small nodule in the rectum, bx=tubular adenoma, EGD=-mild non-erosive gastritis  . Left heart catheterization with coronary angiogram N/A 11/16/2012    Procedure: LEFT HEART CATHETERIZATION WITH CORONARY ANGIOGRAM;  Surgeon: Sherren Mocha, MD;  Location: [facility] CATH LAB;  Service: Cardiovascular;  Laterality: N/A;  . Percutaneous coronary stent intervention (pci-s)  11/16/2012    Procedure: PERCUTANEOUS CORONARY STENT INTERVENTION (PCI-S);  Surgeon: Sherren Mocha, MD;  Location: [facility] CATH LAB;  Service: Cardiovascular;;  . Total knee arthroplasty Left 03/07/2015    Procedure: LEFT TOTAL KNEE ARTHROPLASTY;  Surgeon: Paralee Cancel, MD;  Location: [facility] ORS;  Service: Orthopedics;  Laterality: Left;    Social History   Social History  . Marital Status: Widowed    Spouse Name: N/A  . Number of Children: N/A  . Years of Education: N/A   Occupational History  . Disabled    Social History Main Topics  . Smoking status: Former Smoker -- 1.00 packs/day for 30 years    Types: Cigarettes    Start date: 01/22/1965    Quit date: 12/23/1992  . Smokeless tobacco: Never Used  . Alcohol Use: No  . Drug Use: No  . Sexual Activity: Not on file   Other Topics Concern  . Not on file   Social History Narrative   Married     Filed Vitals:   10/23/15 0822  BP: 128/80  Pulse: 50  Height: 6' 2"  (1.88 m)  Weight: 301 lb (136.533 kg)  SpO2: 98%    PHYSICAL EXAM General: NAD HEENT: Normal. Neck: No JVD, no thyromegaly. Lungs: Clear to auscultation bilaterally with normal respiratory effort. CV: Nondisplaced PMI. Regular rate and rhythm, normal S1/S2, no S3/S4, no murmur. Trace to 1+ pitting pretibia edema.  Abdomen: Soft, nontender, mild distention.  Neurologic: Alert and oriented.  Psych: Normal affect. Skin: Normal. Musculoskeletal: Normal range of motion, no gross deformities. Extremities: No clubbing or cyanosis.   ECG: Most recent ECG reviewed.      ASSESSMENT AND PLAN: 1.  Chronic diastolic heart failure: Euvolemic. No changes to diuretic regimen (torsemide 40 mg bid).  2. CAD s/p PCI of LAD and D1: Stable ischemic heart disease. Continue aspirin 81 mg, metoprolol 25 mg twice daily, and pravastatin 20 mg daily.  3. Essential HTN: Controlled. No changes.  4. Hyperlipidemia: Lipids in 09/2014 showed TC 122, TG 121, HDL 31, LDL 67. Continue pravastatin 20 mg.   5. Erectile dysfunction: I previously prescribed Viagra. Instructed not to be taken with nitrates. Told him to see a urologist about pump.  6. First degree AV block and bradycardia: Stable. No change to  metoprolol dose.  Dispo: f/u 6 months with APP.  Kate Sable, M.D., F.A.C.C.

## 2015-10-23 NOTE — Patient Instructions (Signed)
Your physician wants you to follow-up in: 6 months with M.Lenze PA-C You will receive a reminder letter in the mail two months in advance. If you don't receive a letter, please call our office to schedule the follow-up appointment.    Your physician recommends that you continue on your current medications as directed. Please refer to the Current Medication list given to you today.   If you need a refill on your cardiac medications before your next appointment, please call your pharmacy.    Thank you for choosing [facility] !

## 2015-10-25 ENCOUNTER — Ambulatory Visit (HOSPITAL_COMMUNITY)
Admission: RE | Admit: 2015-10-25 | Discharge: 2015-10-25 | Disposition: A | Discharge status: Home / Self Care | Payer: Medicare Other | Source: Ambulatory Visit | Attending: Nephrology | Admitting: Nephrology

## 2015-10-25 DIAGNOSIS — I10 Essential (primary) hypertension: Secondary | ICD-10-CM | POA: Diagnosis not present

## 2015-10-25 DIAGNOSIS — E559 Vitamin D deficiency, unspecified: Secondary | ICD-10-CM | POA: Diagnosis not present

## 2015-10-25 DIAGNOSIS — R809 Proteinuria, unspecified: Secondary | ICD-10-CM | POA: Diagnosis not present

## 2015-10-25 DIAGNOSIS — N281 Cyst of kidney, acquired: Secondary | ICD-10-CM | POA: Insufficient documentation

## 2015-10-25 DIAGNOSIS — N289 Disorder of kidney and ureter, unspecified: Secondary | ICD-10-CM

## 2015-10-25 DIAGNOSIS — Z79899 Other long term (current) drug therapy: Secondary | ICD-10-CM | POA: Diagnosis not present

## 2015-10-25 DIAGNOSIS — D649 Anemia, unspecified: Secondary | ICD-10-CM | POA: Diagnosis not present

## 2015-10-25 DIAGNOSIS — N183 Chronic kidney disease, stage 3 (moderate): Secondary | ICD-10-CM | POA: Diagnosis not present

## 2015-11-14 DIAGNOSIS — M109 Gout, unspecified: Secondary | ICD-10-CM | POA: Diagnosis not present

## 2015-11-14 DIAGNOSIS — E1129 Type 2 diabetes mellitus with other diabetic kidney complication: Secondary | ICD-10-CM | POA: Diagnosis not present

## 2015-11-14 DIAGNOSIS — N183 Chronic kidney disease, stage 3 (moderate): Secondary | ICD-10-CM | POA: Diagnosis not present

## 2015-11-14 DIAGNOSIS — I10 Essential (primary) hypertension: Secondary | ICD-10-CM | POA: Diagnosis not present

## 2015-11-14 DIAGNOSIS — I509 Heart failure, unspecified: Secondary | ICD-10-CM | POA: Diagnosis not present

## 2015-11-21 DIAGNOSIS — Z96652 Presence of left artificial knee joint: Secondary | ICD-10-CM | POA: Diagnosis not present

## 2015-11-21 DIAGNOSIS — M25562 Pain in left knee: Secondary | ICD-10-CM | POA: Diagnosis not present

## 2015-11-21 DIAGNOSIS — Z471 Aftercare following joint replacement surgery: Secondary | ICD-10-CM | POA: Diagnosis not present

## 2015-11-21 DIAGNOSIS — M25572 Pain in left ankle and joints of left foot: Secondary | ICD-10-CM | POA: Diagnosis not present

## 2015-12-22 DIAGNOSIS — M19072 Primary osteoarthritis, left ankle and foot: Secondary | ICD-10-CM | POA: Diagnosis not present

## 2016-01-09 DIAGNOSIS — M19072 Primary osteoarthritis, left ankle and foot: Secondary | ICD-10-CM | POA: Diagnosis not present

## 2016-01-09 DIAGNOSIS — M25572 Pain in left ankle and joints of left foot: Secondary | ICD-10-CM | POA: Diagnosis not present

## 2016-01-15 DIAGNOSIS — E119 Type 2 diabetes mellitus without complications: Secondary | ICD-10-CM | POA: Diagnosis not present

## 2016-01-15 DIAGNOSIS — E1140 Type 2 diabetes mellitus with diabetic neuropathy, unspecified: Secondary | ICD-10-CM | POA: Diagnosis not present

## 2016-01-15 DIAGNOSIS — Z1389 Encounter for screening for other disorder: Secondary | ICD-10-CM | POA: Diagnosis not present

## 2016-01-15 DIAGNOSIS — G894 Chronic pain syndrome: Secondary | ICD-10-CM | POA: Diagnosis not present

## 2016-01-26 DIAGNOSIS — Z1389 Encounter for screening for other disorder: Secondary | ICD-10-CM | POA: Diagnosis not present

## 2016-01-26 DIAGNOSIS — J329 Chronic sinusitis, unspecified: Secondary | ICD-10-CM | POA: Diagnosis not present

## 2016-01-26 DIAGNOSIS — E782 Mixed hyperlipidemia: Secondary | ICD-10-CM | POA: Diagnosis not present

## 2016-01-26 DIAGNOSIS — J209 Acute bronchitis, unspecified: Secondary | ICD-10-CM | POA: Diagnosis not present

## 2016-01-26 DIAGNOSIS — N184 Chronic kidney disease, stage 4 (severe): Secondary | ICD-10-CM | POA: Diagnosis not present

## 2016-01-26 DIAGNOSIS — I10 Essential (primary) hypertension: Secondary | ICD-10-CM | POA: Diagnosis not present

## 2016-01-30 DIAGNOSIS — J189 Pneumonia, unspecified organism: Secondary | ICD-10-CM | POA: Diagnosis not present

## 2016-01-30 DIAGNOSIS — R05 Cough: Secondary | ICD-10-CM | POA: Diagnosis not present

## 2016-01-30 DIAGNOSIS — Z1389 Encounter for screening for other disorder: Secondary | ICD-10-CM | POA: Diagnosis not present

## 2016-02-06 DIAGNOSIS — D649 Anemia, unspecified: Secondary | ICD-10-CM | POA: Diagnosis not present

## 2016-02-06 DIAGNOSIS — I10 Essential (primary) hypertension: Secondary | ICD-10-CM | POA: Diagnosis not present

## 2016-02-06 DIAGNOSIS — R809 Proteinuria, unspecified: Secondary | ICD-10-CM | POA: Diagnosis not present

## 2016-02-06 DIAGNOSIS — E559 Vitamin D deficiency, unspecified: Secondary | ICD-10-CM | POA: Diagnosis not present

## 2016-02-06 DIAGNOSIS — D509 Iron deficiency anemia, unspecified: Secondary | ICD-10-CM | POA: Diagnosis not present

## 2016-03-06 DIAGNOSIS — G894 Chronic pain syndrome: Secondary | ICD-10-CM | POA: Diagnosis not present

## 2016-03-06 DIAGNOSIS — E1140 Type 2 diabetes mellitus with diabetic neuropathy, unspecified: Secondary | ICD-10-CM | POA: Diagnosis not present

## 2016-03-06 DIAGNOSIS — B351 Tinea unguium: Secondary | ICD-10-CM | POA: Diagnosis not present

## 2016-03-13 DIAGNOSIS — I10 Essential (primary) hypertension: Secondary | ICD-10-CM | POA: Diagnosis not present

## 2016-03-13 DIAGNOSIS — I509 Heart failure, unspecified: Secondary | ICD-10-CM | POA: Diagnosis not present

## 2016-03-13 DIAGNOSIS — N183 Chronic kidney disease, stage 3 (moderate): Secondary | ICD-10-CM | POA: Diagnosis not present

## 2016-03-13 DIAGNOSIS — E1129 Type 2 diabetes mellitus with other diabetic kidney complication: Secondary | ICD-10-CM | POA: Diagnosis not present

## 2016-03-14 DIAGNOSIS — E1140 Type 2 diabetes mellitus with diabetic neuropathy, unspecified: Secondary | ICD-10-CM | POA: Diagnosis not present

## 2016-03-14 DIAGNOSIS — S90129A Contusion of unspecified lesser toe(s) without damage to nail, initial encounter: Secondary | ICD-10-CM | POA: Diagnosis not present

## 2016-03-14 DIAGNOSIS — I10 Essential (primary) hypertension: Secondary | ICD-10-CM | POA: Diagnosis not present

## 2016-04-12 DIAGNOSIS — M1991 Primary osteoarthritis, unspecified site: Secondary | ICD-10-CM | POA: Diagnosis not present

## 2016-04-12 DIAGNOSIS — Z1389 Encounter for screening for other disorder: Secondary | ICD-10-CM | POA: Diagnosis not present

## 2016-04-12 DIAGNOSIS — G894 Chronic pain syndrome: Secondary | ICD-10-CM | POA: Diagnosis not present

## 2016-04-26 DIAGNOSIS — M79672 Pain in left foot: Secondary | ICD-10-CM | POA: Diagnosis not present

## 2016-04-26 DIAGNOSIS — M19072 Primary osteoarthritis, left ankle and foot: Secondary | ICD-10-CM | POA: Diagnosis not present

## 2016-05-13 DIAGNOSIS — I10 Essential (primary) hypertension: Secondary | ICD-10-CM | POA: Diagnosis not present

## 2016-05-13 DIAGNOSIS — B351 Tinea unguium: Secondary | ICD-10-CM | POA: Diagnosis not present

## 2016-05-13 DIAGNOSIS — Z1389 Encounter for screening for other disorder: Secondary | ICD-10-CM | POA: Diagnosis not present

## 2016-05-13 DIAGNOSIS — E119 Type 2 diabetes mellitus without complications: Secondary | ICD-10-CM | POA: Diagnosis not present

## 2016-05-22 DIAGNOSIS — S91212D Laceration without foreign body of left great toe with damage to nail, subsequent encounter: Secondary | ICD-10-CM | POA: Diagnosis not present

## 2016-05-22 DIAGNOSIS — G894 Chronic pain syndrome: Secondary | ICD-10-CM | POA: Diagnosis not present

## 2016-05-22 DIAGNOSIS — Z1389 Encounter for screening for other disorder: Secondary | ICD-10-CM | POA: Diagnosis not present

## 2016-05-27 DIAGNOSIS — Z1389 Encounter for screening for other disorder: Secondary | ICD-10-CM | POA: Diagnosis not present

## 2016-05-27 DIAGNOSIS — B351 Tinea unguium: Secondary | ICD-10-CM | POA: Diagnosis not present

## 2016-06-18 DIAGNOSIS — B351 Tinea unguium: Secondary | ICD-10-CM | POA: Diagnosis not present

## 2016-06-18 DIAGNOSIS — Z1389 Encounter for screening for other disorder: Secondary | ICD-10-CM | POA: Diagnosis not present

## 2016-06-18 DIAGNOSIS — G894 Chronic pain syndrome: Secondary | ICD-10-CM | POA: Diagnosis not present

## 2016-06-26 DIAGNOSIS — E1140 Type 2 diabetes mellitus with diabetic neuropathy, unspecified: Secondary | ICD-10-CM | POA: Diagnosis not present

## 2016-06-26 DIAGNOSIS — L84 Corns and callosities: Secondary | ICD-10-CM | POA: Diagnosis not present

## 2016-06-26 DIAGNOSIS — E119 Type 2 diabetes mellitus without complications: Secondary | ICD-10-CM | POA: Diagnosis not present

## 2016-07-05 DIAGNOSIS — N183 Chronic kidney disease, stage 3 (moderate): Secondary | ICD-10-CM | POA: Diagnosis not present

## 2016-07-05 DIAGNOSIS — D509 Iron deficiency anemia, unspecified: Secondary | ICD-10-CM | POA: Diagnosis not present

## 2016-07-05 DIAGNOSIS — Z79899 Other long term (current) drug therapy: Secondary | ICD-10-CM | POA: Diagnosis not present

## 2016-07-05 DIAGNOSIS — R809 Proteinuria, unspecified: Secondary | ICD-10-CM | POA: Diagnosis not present

## 2016-07-10 DIAGNOSIS — I10 Essential (primary) hypertension: Secondary | ICD-10-CM | POA: Diagnosis not present

## 2016-07-10 DIAGNOSIS — N183 Chronic kidney disease, stage 3 (moderate): Secondary | ICD-10-CM | POA: Diagnosis not present

## 2016-07-10 DIAGNOSIS — I509 Heart failure, unspecified: Secondary | ICD-10-CM | POA: Diagnosis not present

## 2016-07-10 DIAGNOSIS — D509 Iron deficiency anemia, unspecified: Secondary | ICD-10-CM | POA: Diagnosis not present

## 2016-07-10 DIAGNOSIS — E1129 Type 2 diabetes mellitus with other diabetic kidney complication: Secondary | ICD-10-CM | POA: Diagnosis not present

## 2016-07-16 ENCOUNTER — Other Ambulatory Visit: Payer: Self-pay | Admitting: Cardiovascular Disease

## 2016-07-17 DIAGNOSIS — I10 Essential (primary) hypertension: Secondary | ICD-10-CM | POA: Diagnosis not present

## 2016-07-17 DIAGNOSIS — E1129 Type 2 diabetes mellitus with other diabetic kidney complication: Secondary | ICD-10-CM | POA: Diagnosis not present

## 2016-07-17 DIAGNOSIS — M5410 Radiculopathy, site unspecified: Secondary | ICD-10-CM | POA: Diagnosis not present

## 2016-07-25 ENCOUNTER — Ambulatory Visit (INDEPENDENT_AMBULATORY_CARE_PROVIDER_SITE_OTHER): Payer: Medicare Other | Admitting: Cardiology

## 2016-07-25 ENCOUNTER — Encounter: Payer: Self-pay | Admitting: Cardiology

## 2016-07-25 DIAGNOSIS — N183 Chronic kidney disease, stage 3 unspecified: Secondary | ICD-10-CM

## 2016-07-25 DIAGNOSIS — E119 Type 2 diabetes mellitus without complications: Secondary | ICD-10-CM | POA: Diagnosis not present

## 2016-07-25 DIAGNOSIS — I2510 Atherosclerotic heart disease of native coronary artery without angina pectoris: Secondary | ICD-10-CM

## 2016-07-25 DIAGNOSIS — E785 Hyperlipidemia, unspecified: Secondary | ICD-10-CM | POA: Diagnosis not present

## 2016-07-25 DIAGNOSIS — E1169 Type 2 diabetes mellitus with other specified complication: Secondary | ICD-10-CM

## 2016-07-25 DIAGNOSIS — I5032 Chronic diastolic (congestive) heart failure: Secondary | ICD-10-CM

## 2016-07-25 DIAGNOSIS — Z9861 Coronary angioplasty status: Secondary | ICD-10-CM

## 2016-07-25 DIAGNOSIS — E669 Obesity, unspecified: Secondary | ICD-10-CM

## 2016-07-25 NOTE — Assessment & Plan Note (Signed)
Pt with obesity and CRI

## 2016-07-25 NOTE — Progress Notes (Signed)
07/25/2016 [name]   Apr 08, 1946  [id number]  Primary Physician [name]., MD Primary Cardiologist: Dr [name]  HPI:  [name] with a history of hypertension and hypertensive heart disease, diabetes, obesity, stage 3-4 CRI, and coronary artery disease. He had a NSTEMI on 11/16/2012 and received a drug-eluting stent to the LAD and D1, with residual diffuse nonobstructive disease in the RCA and LCx. His most recent echocardiogram on 07/06/15 demonstrated normal left ventricular systolic function, EF 76-72%, and grade 2 diastolic dysfunction. He is in the office today for a 6 month follow up. He denies any chest pain or unusual dyspnea. He self adjusts his diuretic based on his wgt. He is followed by Dr [name]. He goes to the gym 5 days a week "lift some weights and do cardio".    Current Outpatient Prescriptions  Medication Sig Dispense Refill  . acetaminophen (TYLENOL) 325 MG tablet Take 1-2 tablets (325-650 mg total) by mouth every 6 (six) hours as needed.    Marland Kitchen allopurinol (ZYLOPRIM) 300 MG tablet Take 300 mg by mouth every morning.     Marland Kitchen ALPRAZolam (XANAX) 0.5 MG tablet Take one tablet by mouth twice daily as needed for anxiety (Patient taking differently: Take 0.5 mg by mouth 3 (three) times daily. ) 60 tablet 5  . aspirin EC 81 MG tablet Take 81 mg by mouth daily.    . ferrous sulfate 325 (65 FE) MG tablet Take 1 tablet (325 mg total) by mouth 3 (three) times daily after meals.  3  . gabapentin (NEURONTIN) 300 MG capsule Take 300 mg by mouth 2 (two) times daily.     Marland Kitchen HYDROcodone-acetaminophen (NORCO) 10-325 MG per tablet Take 1 tablet by mouth 5 (five) times daily as needed for moderate pain or severe pain.    . metoprolol (LOPRESSOR) 50 MG tablet Take 0.5 tablets (25 mg total) by mouth 2 (two) times daily. 30 tablet 0  . NITROSTAT 0.4 MG SL tablet DISSOLVE ONE TABLET UNDER THE TONGUE AS NEEDED FOR CHEST PAIN. 25 tablet 1  . omeprazole (PRILOSEC) 20 MG capsule  Take 20 mg by mouth every morning.     . pravastatin (PRAVACHOL) 20 MG tablet Take 20 mg by mouth every morning.     . saxagliptin HCl (ONGLYZA) 2.5 MG TABS tablet Take 2.5 mg by mouth every morning.    . torsemide (DEMADEX) 20 MG tablet Take 2 tablets (40 mg total) by mouth 2 (two) times daily. Start on 7/19 60 tablet 1   No current facility-administered medications for this visit.     Allergies  Allergen Reactions  . Niacin And Related Other (See Comments)    Reaction: flushing and burning side effects even with extended release    Social History   Social History  . Marital status: Widowed    Spouse name: N/A  . Number of children: N/A  . Years of education: N/A   Occupational History  . Disabled Retired   Social History Main Topics  . Smoking status: Former Smoker    Packs/day: 1.00    Years: 30.00    Types: Cigarettes    Start date: 01/22/1965    Quit date: 12/23/1992  . Smokeless tobacco: Never Used  . Alcohol use No  . Drug use: No  . Sexual activity: Not on file   Other Topics Concern  . Not on file   Social History Narrative   Married     Review of Systems: General: negative  for chills, fever, night sweats or weight changes.  Cardiovascular: negative for chest pain, dyspnea on exertion, edema, orthopnea, palpitations, paroxysmal nocturnal dyspnea or shortness of breath Dermatological: negative for rash Respiratory: negative for cough or wheezing Urologic: negative for hematuria Abdominal: negative for nausea, vomiting, diarrhea, bright red blood per rectum, melena, or hematemesis Neurologic: negative for visual changes, syncope, or dizziness All other systems reviewed and are otherwise negative except as noted above.    Blood pressure 126/64, pulse (!) 47, height 6' 2"  (1.88 m), weight 298 lb (135.2 kg), SpO2 93 %.  General appearance: alert, cooperative, no distress and moderately obese Neck: no carotid bruit and no JVD Lungs: clear to auscultation  bilaterally Heart: regular rate and rhythm Abdomen: obese, non tender Extremities: 1+ bilateral LE edema, bilateral TKR scars Pulses: 2+ and symmetric Skin: Skin color, texture, turgor normal. No rashes or lesions Neurologic: Grossly normal   ASSESSMENT AND PLAN:   CAD S/P percutaneous coronary angioplasty LAD and D1 DES placed Nov 2013, insignificant CX and RCA disease, EF-55%, mid-anterolateral hypokinesis  Chronic kidney disease, stage 3, cardiorenal syndrome Stage 3-4, followed by Dr [name]  Diabetes mellitus type 2 in obese ([place]) Pt with obesity and CRI  HLD (hyperlipidemia) On statin Rx  Diastolic CHF, chronic Echo July 2016- EF 60-65% with grade 2 DD  Obese BMI 38   PLAN  Same Rx, F/U Dr [name] in 6 months.   Kerin Ransom PA-C 07/25/2016 3:45 PM

## 2016-07-25 NOTE — Patient Instructions (Signed)
Your physician wants you to follow-up in: 6 Months with Dr. Bronson Ing.  You will receive a reminder letter in the mail two months in advance. If you don't receive a letter, please call our office to schedule the follow-up appointment.  Your physician recommends that you continue on your current medications as directed. Please refer to the Current Medication list given to you today.  If you need a refill on your cardiac medications before your next appointment, please call your pharmacy.  Thank you for choosing [facility]!

## 2016-07-25 NOTE — Assessment & Plan Note (Signed)
On statin Rx

## 2016-07-25 NOTE — Assessment & Plan Note (Signed)
Echo July 2016- EF 60-65% with grade 2 DD

## 2016-07-25 NOTE — Assessment & Plan Note (Signed)
Stage 3-4, followed by Dr Hinda Lenis

## 2016-07-25 NOTE — Assessment & Plan Note (Signed)
LAD and D1 DES placed Nov 2013, insignificant CX and RCA disease, EF-55%, mid-anterolateral hypokinesis

## 2016-07-25 NOTE — Assessment & Plan Note (Signed)
BMI 38 

## 2016-08-02 DIAGNOSIS — M19072 Primary osteoarthritis, left ankle and foot: Secondary | ICD-10-CM | POA: Diagnosis not present

## 2016-08-02 DIAGNOSIS — M79672 Pain in left foot: Secondary | ICD-10-CM | POA: Diagnosis not present

## 2016-08-05 ENCOUNTER — Other Ambulatory Visit: Payer: Self-pay | Admitting: Cardiovascular Disease

## 2016-08-05 ENCOUNTER — Other Ambulatory Visit: Payer: Self-pay

## 2016-08-05 MED ORDER — TORSEMIDE 20 MG PO TABS: 40.0000 mg | ORAL_TABLET | Freq: Two times a day (BID) | ORAL | 3 refills | Status: DC

## 2016-08-05 NOTE — Telephone Encounter (Signed)
Per ov with l kilroy on 07/25/16 dose of torsemide increased to 40 mg bid

## 2016-09-02 DIAGNOSIS — Z1389 Encounter for screening for other disorder: Secondary | ICD-10-CM | POA: Diagnosis not present

## 2016-09-02 DIAGNOSIS — R197 Diarrhea, unspecified: Secondary | ICD-10-CM | POA: Diagnosis not present

## 2016-09-18 DIAGNOSIS — E782 Mixed hyperlipidemia: Secondary | ICD-10-CM | POA: Diagnosis not present

## 2016-09-18 DIAGNOSIS — Z1389 Encounter for screening for other disorder: Secondary | ICD-10-CM | POA: Diagnosis not present

## 2016-09-18 DIAGNOSIS — G894 Chronic pain syndrome: Secondary | ICD-10-CM | POA: Diagnosis not present

## 2016-09-18 DIAGNOSIS — Z79891 Long term (current) use of opiate analgesic: Secondary | ICD-10-CM | POA: Diagnosis not present

## 2016-09-24 ENCOUNTER — Encounter: Payer: Self-pay | Admitting: *Deleted

## 2016-09-25 ENCOUNTER — Ambulatory Visit (INDEPENDENT_AMBULATORY_CARE_PROVIDER_SITE_OTHER): Payer: Medicare Other | Admitting: Cardiovascular Disease

## 2016-09-25 VITALS — BP 132/72 | HR 40 | Ht 74.0 in | Wt 308.0 lb

## 2016-09-25 DIAGNOSIS — N183 Chronic kidney disease, stage 3 unspecified: Secondary | ICD-10-CM

## 2016-09-25 DIAGNOSIS — I5033 Acute on chronic diastolic (congestive) heart failure: Secondary | ICD-10-CM

## 2016-09-25 DIAGNOSIS — E7800 Pure hypercholesterolemia, unspecified: Secondary | ICD-10-CM

## 2016-09-25 DIAGNOSIS — R001 Bradycardia, unspecified: Secondary | ICD-10-CM

## 2016-09-25 DIAGNOSIS — I440 Atrioventricular block, first degree: Secondary | ICD-10-CM

## 2016-09-25 DIAGNOSIS — I10 Essential (primary) hypertension: Secondary | ICD-10-CM

## 2016-09-25 DIAGNOSIS — I2510 Atherosclerotic heart disease of native coronary artery without angina pectoris: Secondary | ICD-10-CM

## 2016-09-25 MED ORDER — METOLAZONE 2.5 MG PO TABS: 2.5000 mg | ORAL_TABLET | Freq: Every day | ORAL | 0 refills | Status: DC

## 2016-09-25 NOTE — Patient Instructions (Signed)
Medication Instructions:   Stop Metoprolol tart (Lopressor).  Begin Metolazone 2.24m daily x 3 days only.  Continue all other medications.    Labwork: none  Testing/Procedures: none  Follow-Up: 1 week - [place] office   Any Other Special Instructions Will Be Listed Below (If Applicable).  If you need a refill on your cardiac medications before your next appointment, please call your pharmacy.

## 2016-09-25 NOTE — Progress Notes (Signed)
SUBJECTIVE: Mr. Osment presents for routine follow up. He has a history of hypertension and hypertensive heart disease as well as coronary artery disease and sustained a NSTEMI on 11/16/2012 and received a drug-eluting stent to the LAD and D1, with residual diffuse nonobstructive disease in the RCA and LCx.  He also has CKD, sleep apnea, obesity, and chronic diastolic heart failure.  Most recent echocardiogram ion 07/06/15 demonstrated normal left ventricular systolic function, EF 63-89%, and grade 2 diastolic dysfunction.  Wt 308 lbs (298 lbs 07/25/16).  ECG sinus brady, 38 bpm, nonspecific T wave abnormality, 1st degree AV block, PR 276 ms, nonspecific IVCD.  Denies chest pain, SOB, and dizziness. Developed b/l leg swelling last week. Trying to limit water and salt intake.   Review of Systems: As per "subjective", otherwise negative.  Allergies  Allergen Reactions  . Niacin And Related Other (See Comments)    Reaction: flushing and burning side effects even with extended release    Current Outpatient Prescriptions  Medication Sig Dispense Refill  . acetaminophen (TYLENOL) 325 MG tablet Take 1-2 tablets (325-650 mg total) by mouth every 6 (six) hours as needed.    Marland Kitchen allopurinol (ZYLOPRIM) 300 MG tablet Take 300 mg by mouth every morning.     Marland Kitchen ALPRAZolam (XANAX) 0.5 MG tablet Take one tablet by mouth twice daily as needed for anxiety (Patient taking differently: Take 0.5 mg by mouth 3 (three) times daily. ) 60 tablet 5  . aspirin EC 81 MG tablet Take 81 mg by mouth daily.    . DULoxetine (CYMBALTA) 30 MG capsule Take 1 capsule by mouth daily.    . ferrous sulfate 325 (65 FE) MG tablet Take 1 tablet (325 mg total) by mouth 3 (three) times daily after meals. (Patient taking differently: Take 325 mg by mouth daily with breakfast. )  3  . gabapentin (NEURONTIN) 300 MG capsule Take 300 mg by mouth 2 (two) times daily.     Marland Kitchen HYDROcodone-acetaminophen (NORCO) 10-325 MG per tablet Take  1 tablet by mouth 5 (five) times daily as needed for moderate pain or severe pain.    . metoprolol (LOPRESSOR) 50 MG tablet Take 0.5 tablets (25 mg total) by mouth 2 (two) times daily. 30 tablet 0  . NITROSTAT 0.4 MG SL tablet DISSOLVE ONE TABLET UNDER THE TONGUE AS NEEDED FOR CHEST PAIN. 25 tablet 1  . omeprazole (PRILOSEC) 20 MG capsule Take 20 mg by mouth every morning.     . pravastatin (PRAVACHOL) 20 MG tablet Take 20 mg by mouth every morning.     . saxagliptin HCl (ONGLYZA) 2.5 MG TABS tablet Take 2.5 mg by mouth every morning.    . torsemide (DEMADEX) 20 MG tablet Take 2 tablets (40 mg total) by mouth 2 (two) times daily. Start on 7/19 120 tablet 3   No current facility-administered medications for this visit.     Past Medical History:  Diagnosis Date  . Anxiety   . Benign prostatic hypertrophy    Nocturia  . CAD (coronary artery disease)    a.  NSTEMI 10/2012 s/p DES to LAD & DES to 1st diagonal with residual diffuse nonobstructive dz in LCx/RCA   . Chronic kidney disease, stage 3, mod decreased GFR    Creatinine of 1.5 in 03/2009  . Degenerative joint disease   . Depression    hx of  . Diabetes mellitus, type II ([place]) 12/10/2012.  . Diastolic heart failure    Pedal edema, "a  long time ago"  . Erectile dysfunction   . Flu 2013   hx of  . GERD (gastroesophageal reflux disease)   . Hemorrhoids   . High cholesterol   . Hypertension    Exercise induced  . NSVT (nonsustained ventricular tachycardia) (HCC)    Exercise induced  . Obesity   . 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 Tubular adenoma 2014    Past Surgical History:  Procedure Laterality Date  . BACK SURGERY  2007   x2  . CARDIAC CATHETERIZATION     with stent placement  . COLONOSCOPY WITH ESOPHAGOGASTRODUODENOSCOPY (EGD) N/A 11/23/2013   Dr. Oneida Alar- TCS= left sided colitis and mild proctitis, moderate internal hemorrhoids, small nodule in the rectum, bx=tubular adenoma, EGD=-mild  non-erosive gastritis  . HERNIA REPAIR    . LEFT HEART CATHETERIZATION WITH CORONARY ANGIOGRAM N/A 11/16/2012   Procedure: LEFT HEART CATHETERIZATION WITH CORONARY ANGIOGRAM;  Surgeon: Sherren Mocha, MD;  Location: [facility] CATH LAB;  Service: Cardiovascular;  Laterality: N/A;  . PERCUTANEOUS CORONARY STENT INTERVENTION (PCI-S)  11/16/2012   Procedure: PERCUTANEOUS CORONARY STENT INTERVENTION (PCI-S);  Surgeon: Sherren Mocha, MD;  Location: [facility] CATH LAB;  Service: Cardiovascular;;  . TOTAL KNEE ARTHROPLASTY Right 11/15/2013   Procedure: RIGHT TOTAL KNEE ARTHROPLASTY;  Surgeon: Mauri Pole, MD;  Location: [facility] ORS;  Service: Orthopedics;  Laterality: Right;  . TOTAL KNEE ARTHROPLASTY Left 03/07/2015   Procedure: LEFT TOTAL KNEE ARTHROPLASTY;  Surgeon: Paralee Cancel, MD;  Location: [facility] ORS;  Service: Orthopedics;  Laterality: Left;    Social History   Social History  . Marital status: Widowed    Spouse name: N/A  . Number of children: N/A  . Years of education: N/A   Occupational History  . Disabled Retired   Social History Main Topics  . Smoking status: Former Smoker    Packs/day: 1.00    Years: 30.00    Types: Cigarettes    Start date: 01/22/1965    Quit date: 12/23/1992  . Smokeless tobacco: Never Used  . Alcohol use No  . Drug use: No  . Sexual activity: Not on file   Other Topics Concern  . Not on file   Social History Narrative   Married     Vitals:   09/25/16 1257  BP: 132/72  Pulse: (!) 40  SpO2: 98%  Weight: (!) 308 lb (139.7 kg)  Height: 6' 2"  (1.88 m)    PHYSICAL EXAM General: NAD HEENT: Normal. Neck: No JVD, no thyromegaly. Lungs: Clear to auscultation bilaterally with normal respiratory effort. CV: Nondisplaced PMI. Bradycardic, regular rhythm, normal S1/S2, no S3/S4, no murmur. 1+ pitting pretibia edema b/l. Abdomen: Soft, nontender, mild distention.  Neurologic: Alert and oriented.  Psych: Normal affect. Skin: Normal. Musculoskeletal: No gross  deformities. Extremities: No clubbing or cyanosis.     ECG: Most recent ECG reviewed.      ASSESSMENT AND PLAN: 1. Acute on chronic diastolic heart failure: Likely due to marked bradycardia. Will stop metoprolol and prescribe metolazone 2.5 mg daily x 3 days.  2. CAD s/p PCI of LAD and D1: Stable ischemic heart disease. Continue aspirin 81 mg and pravastatin 20 mg daily. Will stop metoprolol.  3. Essential HTN: Controlled. No changes.  4. Hyperlipidemia: Continue pravastatin 20 mg.   5. First degree AV block and bradycardia: Will discontinue metoprolol.  Dispo: f/u one week with Gerrianne Scale PA-C.   Kate Sable, M.D., F.A.C.C.

## 2016-09-27 ENCOUNTER — Telehealth: Payer: Self-pay | Admitting: Cardiovascular Disease

## 2016-09-27 NOTE — Telephone Encounter (Signed)
Yes

## 2016-09-27 NOTE — Telephone Encounter (Signed)
Patient would like to know if he can exercise.  Stated that he is feeling a lot better.

## 2016-09-27 NOTE — Telephone Encounter (Signed)
Routed to Dr. Bronson Ing

## 2016-09-27 NOTE — Telephone Encounter (Signed)
Pt aware.

## 2016-10-02 ENCOUNTER — Ambulatory Visit (INDEPENDENT_AMBULATORY_CARE_PROVIDER_SITE_OTHER): Payer: Medicare Other | Admitting: Physician Assistant

## 2016-10-02 ENCOUNTER — Encounter: Payer: Self-pay | Admitting: Physician Assistant

## 2016-10-02 ENCOUNTER — Other Ambulatory Visit (HOSPITAL_COMMUNITY)
Admission: RE | Admit: 2016-10-02 | Discharge: 2016-10-02 | Disposition: A | Discharge status: Home / Self Care | Payer: Medicare Other | Source: Ambulatory Visit | Attending: Physician Assistant | Admitting: Physician Assistant

## 2016-10-02 VITALS — BP 130/88 | HR 86 | Ht 74.0 in | Wt 301.0 lb

## 2016-10-02 DIAGNOSIS — I10 Essential (primary) hypertension: Secondary | ICD-10-CM

## 2016-10-02 DIAGNOSIS — I5033 Acute on chronic diastolic (congestive) heart failure: Secondary | ICD-10-CM | POA: Diagnosis not present

## 2016-10-02 DIAGNOSIS — R001 Bradycardia, unspecified: Secondary | ICD-10-CM | POA: Diagnosis not present

## 2016-10-02 DIAGNOSIS — Z79899 Other long term (current) drug therapy: Secondary | ICD-10-CM

## 2016-10-02 LAB — BASIC METABOLIC PANEL
ANION GAP: 10 (ref 5–15)
BUN: 45 mg/dL — AB (ref 6–20)
CO2: 25 mmol/L (ref 22–32)
Calcium: 9.4 mg/dL (ref 8.9–10.3)
Chloride: 100 mmol/L — ABNORMAL LOW (ref 101–111)
Creatinine, Ser: 2.61 mg/dL — ABNORMAL HIGH (ref 0.61–1.24)
GFR, EST AFRICAN AMERICAN: 27 mL/min — AB (ref >60)
GFR, EST NON AFRICAN AMERICAN: 23 mL/min — AB (ref >60)
Glucose, Bld: 171 mg/dL — ABNORMAL HIGH (ref 65–99)
POTASSIUM: 4.4 mmol/L (ref 3.5–5.1)
SODIUM: 135 mmol/L (ref 135–145)

## 2016-10-02 MED ORDER — METOLAZONE 2.5 MG PO TABS: 2.5000 mg | ORAL_TABLET | Freq: Every day | ORAL | 0 refills | Status: DC

## 2016-10-02 NOTE — Patient Instructions (Signed)
Your physician recommends that you schedule a follow-up appointment with Dr. Bronson Ing.   Your physician has recommended you make the following change in your medication:   Take and Extra Metolazone Tomorrow   Do not Take Metoprolol   Your physician recommends that you have lab work done today.  If you need a refill on your cardiac medications before your next appointment, please call your pharmacy.  Thank you for choosing [facility]!

## 2016-10-02 NOTE — Progress Notes (Signed)
Cardiology Office Note    Date:  10/02/2016   ID:  [name], DOB December 07, 1946, MRN [id number]  PCP:  Glo Herring., MD  Cardiologist: Dr. Bronson Ing  Chief Complaint  Patient presents with  . Follow-up    History of Present Illness:  [name] is a 70 y.o. male with history of CAD status post NSTEMI 2013 treated with DES to the LAD and D1 with residual diffuse nonobstructive disease in the RCA and circumflex. He also has CK D, hypertension and hypertensive heart disease, chronic diastolic CHF, sleep apnea and obesity.  Most recent echo 06/2015 normal LVEF 60-65% with grade 2 DD.  Patient saw Dr. Bronson Ing last week and weight was up 10 pounds to 308 felt to be in acute diastolic heart failure likely due to marked bradycardia. Metoprolol was stopped metolazone 2.5 mg was given daily for 3 days.  Patient is here today for follow-up.He says on his scales he lost 10 pounds after 3 days of metolazone. He still has a little bit of fluid but he feels a lot better. His weight at home was 298 pounds today. He is feeling much better off the metoprolol. He is back to exercising and using the elliptical. The fatigue has improved. He denies chest pain, palpitations, dizziness or presyncope.    Past Medical History:  Diagnosis Date  . Anxiety   . Benign prostatic hypertrophy    Nocturia  . CAD (coronary artery disease)    a.  NSTEMI 10/2012 s/p DES to LAD & DES to 1st diagonal with residual diffuse nonobstructive dz in LCx/RCA   . Chronic kidney disease, stage 3, mod decreased GFR    Creatinine of 1.5 in 03/2009  . Degenerative joint disease   . Depression    hx of  . Diabetes mellitus, type II ([place]) 12/10/2012.  . Diastolic heart failure    Pedal edema, "a long time ago"  . Erectile dysfunction   . Flu 2013   hx of  . GERD (gastroesophageal reflux disease)   . Hemorrhoids   . High cholesterol   . Hypertension    Exercise induced  . NSVT (nonsustained ventricular  tachycardia) (HCC)    Exercise induced  . Obesity   . 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 Tubular adenoma 2014    Past Surgical History:  Procedure Laterality Date  . BACK SURGERY  2007   x2  . CARDIAC CATHETERIZATION     with stent placement  . COLONOSCOPY WITH ESOPHAGOGASTRODUODENOSCOPY (EGD) N/A 11/23/2013   Dr. Oneida Alar- TCS= left sided colitis and mild proctitis, moderate internal hemorrhoids, small nodule in the rectum, bx=tubular adenoma, EGD=-mild non-erosive gastritis  . HERNIA REPAIR    . LEFT HEART CATHETERIZATION WITH CORONARY ANGIOGRAM N/A 11/16/2012   Procedure: LEFT HEART CATHETERIZATION WITH CORONARY ANGIOGRAM;  Surgeon: Sherren Mocha, MD;  Location: [facility] CATH LAB;  Service: Cardiovascular;  Laterality: N/A;  . PERCUTANEOUS CORONARY STENT INTERVENTION (PCI-S)  11/16/2012   Procedure: PERCUTANEOUS CORONARY STENT INTERVENTION (PCI-S);  Surgeon: Sherren Mocha, MD;  Location: [facility] CATH LAB;  Service: Cardiovascular;;  . TOTAL KNEE ARTHROPLASTY Right 11/15/2013   Procedure: RIGHT TOTAL KNEE ARTHROPLASTY;  Surgeon: Mauri Pole, MD;  Location: [facility];  Service: Orthopedics;  Laterality: Right;  . TOTAL KNEE ARTHROPLASTY Left 03/07/2015   Procedure: LEFT TOTAL KNEE ARTHROPLASTY;  Surgeon: Paralee Cancel, MD;  Location: [facility] ORS;  Service: Orthopedics;  Laterality: Left;    Current Medications: Outpatient  Medications Prior to Visit  Medication Sig Dispense Refill  . acetaminophen (TYLENOL) 325 MG tablet Take 1-2 tablets (325-650 mg total) by mouth every 6 (six) hours as needed.    Marland Kitchen allopurinol (ZYLOPRIM) 300 MG tablet Take 300 mg by mouth every morning.     Marland Kitchen ALPRAZolam (XANAX) 0.5 MG tablet Take one tablet by mouth twice daily as needed for anxiety (Patient taking differently: Take 0.5 mg by mouth 3 (three) times daily. ) 60 tablet 5  . aspirin EC 81 MG tablet Take 81 mg by mouth daily.    . DULoxetine (CYMBALTA) 30 MG capsule Take 1 capsule by mouth daily.     . ferrous sulfate 325 (65 FE) MG tablet Take 1 tablet (325 mg total) by mouth 3 (three) times daily after meals. (Patient taking differently: Take 325 mg by mouth daily with breakfast. )  3  . gabapentin (NEURONTIN) 300 MG capsule Take 300 mg by mouth 2 (two) times daily.     Marland Kitchen HYDROcodone-acetaminophen (NORCO) 10-325 MG per tablet Take 1 tablet by mouth 5 (five) times daily as needed for moderate pain or severe pain.    Marland Kitchen NITROSTAT 0.4 MG SL tablet DISSOLVE ONE TABLET UNDER THE TONGUE AS NEEDED FOR CHEST PAIN. 25 tablet 1  . omeprazole (PRILOSEC) 20 MG capsule Take 20 mg by mouth every morning.     . pravastatin (PRAVACHOL) 20 MG tablet Take 20 mg by mouth every morning.     . saxagliptin HCl (ONGLYZA) 2.5 MG TABS tablet Take 2.5 mg by mouth every morning.    . torsemide (DEMADEX) 20 MG tablet Take 2 tablets (40 mg total) by mouth 2 (two) times daily. Start on 7/19 120 tablet 3  . metolazone (ZAROXOLYN) 2.5 MG tablet Take 1 tablet (2.5 mg total) by mouth daily. 3 tablet 0   No facility-administered medications prior to visit.      Allergies:   Niacin and related   Social History   Social History  . Marital status: Widowed    Spouse name: N/A  . Number of children: N/A  . Years of education: N/A   Occupational History  . Disabled Retired   Social History Main Topics  . Smoking status: Former Smoker    Packs/day: 1.00    Years: 30.00    Types: Cigarettes    Start date: 01/22/1965    Quit date: 12/23/1992  . Smokeless tobacco: Never Used  . Alcohol use No  . Drug use: No  . Sexual activity: Not Currently   Other Topics Concern  . None   Social History Narrative   Married     Family History:  The patient's   family history includes Cancer in his other; Hypertension in his other.   ROS:   Please see the history of present illness.    Review of Systems  Constitution: Negative.  HENT: Negative.   Cardiovascular: Positive for dyspnea on exertion and leg swelling.    Respiratory: Negative.   Endocrine: Negative.   Hematologic/Lymphatic: Negative.   Musculoskeletal: Positive for muscle weakness, myalgias and stiffness.  Gastrointestinal: Negative.   Genitourinary: Negative.   Neurological: Negative.    All other systems reviewed and are negative.   PHYSICAL EXAM:   VS:  BP 130/88   Pulse 86   Ht 6' 2"  (1.88 m)   Wt (!) 301 lb (136.5 kg)   SpO2 94%   BMI 38.65 kg/m   Physical Exam  GEN: Obese in no acute distress  Neck: no JVD, carotid bruits, or masses Cardiac:RRR; 1/6 systolic murmur at the left sternal border, no rubs, or gallops  Respiratory:  Decreased breath sounds with crackles at the bases GI: soft, nontender, nondistended, + BS Ext: +1 ankle edema bilaterally right greater than left Good distal pulses bilaterally MS: no deformity or atrophy  Skin: warm and dry, no rash Psych: euthymic mood, full affect  Wt Readings from Last 3 Encounters:  10/02/16 (!) 301 lb (136.5 kg)  09/25/16 (!) 308 lb (139.7 kg)  07/25/16 298 lb (135.2 kg)      Studies/Labs Reviewed:   EKG:  EKG is not ordered today.    Recent Labs: No results found for requested labs within last 8760 hours.   Lipid Panel    Component Value Date/Time   CHOL 121 11/15/2012 0625   TRIG 57 11/15/2012 0625   HDL 36 (L) 11/15/2012 0625   CHOLHDL 3.4 11/15/2012 0625   VLDL 11 11/15/2012 0625   LDLCALC 74 11/15/2012 0625    Additional studies/ records that were reviewed today include:   2-D echo 07/06/15 Study Conclusions   - Left ventricle: The cavity size was normal. Wall thickness was   increased in a pattern of mild LVH. Systolic function was normal.   The estimated ejection fraction was in the range of 60% to 65%.   Wall motion was normal; there were no regional wall motion   abnormalities. Features are consistent with a pseudonormal left   ventricular filling pattern, with concomitant abnormal relaxation   and increased filling pressure (grade 2  diastolic dysfunction). - Aortic valve: Mildly calcified annulus. Trileaflet; mildly   thickened leaflets. Valve area (VTI): 2.87 cm^2. Valve area   (Vmax): 2.62 cm^2. - Left atrium: The atrium was moderately dilated. - Inferior vena cava: The vessel was dilated. The respirophasic   diameter changes were blunted (< 50%), consistent with elevated   central venous pressure. - Technically adequate study.       ASSESSMENT:    1. Drug therapy   2. Acute on chronic diastolic congestive heart failure ([place])   3. Essential hypertension   4. Bradycardia      PLAN:  In order of problems listed above:  Drug therapy patient's heart rate is much better today. Continue to hold metoprolol. Adjusting diuretics and checking labs.  Acute on chronic diastolic heart failure has improved with 7 pound weight loss on our scales 10 pounds on his scales. I've asked him to take an extra metolazone tomorrow and continue to weigh himself daily we'll check labs today to make sure her renal function and potassium are stable. F/U with Dr. Bronson Ing next month.  Hypertension stable off metoprolol  Bradycardia heart rate 86 today off metoprolol continue to hold off on this drug.    Medication Adjustments/Labs and Tests Ordered: Current medicines are reviewed at length with the patient today.  Concerns regarding medicines are outlined above.  Medication changes, Labs and Tests ordered today are listed in the Patient Instructions below. There are no Patient Instructions on file for this visit.   Signed, Ermalinda Barrios, PA-C  10/02/2016 2:16 PM    [facility] [place], [place], [place]  [address] Phone: [phone]; Fax: [phone]

## 2016-10-03 ENCOUNTER — Telehealth: Payer: Self-pay | Admitting: *Deleted

## 2016-10-03 NOTE — Telephone Encounter (Signed)
-----   Message from Imogene Burn, PA-C sent at 10/02/2016  3:14 PM EDT ----- Kidney function up from a year ago. Probably higher because of recent metolazone. Does he have a renal dr? If not he should be referred.

## 2016-10-03 NOTE — Telephone Encounter (Signed)
Called patient with test results. No answer. Left message to call back.  

## 2016-10-14 ENCOUNTER — Other Ambulatory Visit: Payer: Self-pay | Admitting: Cardiovascular Disease

## 2016-10-14 MED ORDER — TORSEMIDE 20 MG PO TABS: 40.0000 mg | ORAL_TABLET | Freq: Two times a day (BID) | ORAL | 3 refills | Status: DC

## 2016-10-14 NOTE — Addendum Note (Signed)
Addended by: Barbarann Ehlers A on: 10/14/2016 04:45 PM   Modules accepted: Orders

## 2016-10-14 NOTE — Telephone Encounter (Signed)
°*  STAT* If patient is at the pharmacy, call can be transferred to refill team.   1. Which medications need to be refilled? (please list name of each medication and dose if known)   torsemide (DEMADEX) 20 MG tablet [[id number]]    2. Which pharmacy/location (including street and city if local pharmacy) is medication to be sent to  [facility]   3. Do they need a 30 day or 90 day supply?    90 day

## 2016-10-14 NOTE — Telephone Encounter (Signed)
Refill 90 day

## 2016-10-17 DIAGNOSIS — I10 Essential (primary) hypertension: Secondary | ICD-10-CM | POA: Diagnosis not present

## 2016-10-17 DIAGNOSIS — E559 Vitamin D deficiency, unspecified: Secondary | ICD-10-CM | POA: Diagnosis not present

## 2016-10-17 DIAGNOSIS — D509 Iron deficiency anemia, unspecified: Secondary | ICD-10-CM | POA: Diagnosis not present

## 2016-10-17 DIAGNOSIS — E748 Other specified disorders of carbohydrate metabolism: Secondary | ICD-10-CM | POA: Diagnosis not present

## 2016-10-17 DIAGNOSIS — G894 Chronic pain syndrome: Secondary | ICD-10-CM | POA: Diagnosis not present

## 2016-10-17 DIAGNOSIS — N183 Chronic kidney disease, stage 3 (moderate): Secondary | ICD-10-CM | POA: Diagnosis not present

## 2016-10-17 DIAGNOSIS — R809 Proteinuria, unspecified: Secondary | ICD-10-CM | POA: Diagnosis not present

## 2016-10-18 ENCOUNTER — Telehealth: Payer: Self-pay | Admitting: Cardiovascular Disease

## 2016-10-18 NOTE — Telephone Encounter (Addendum)
Spoke with pt who request a refill on Metolazone 2.5 mg. Pt. Reports weight on Wed 10/25 of 288lb, Thur. 288.5 lb, Fri. 290 lb. Jory Sims, NP notified of request and daily wts. Refill request denied at this time. Pt notified that request has been denied and voiced understanding.

## 2016-10-18 NOTE — Telephone Encounter (Signed)
Please call patient regarding fluid pill. States that he was called in wrong pill. / tg

## 2016-10-18 NOTE — Telephone Encounter (Signed)
Discussed with Mrs. Pinnix LPN. Agree that he is to get refills on metolazone over the phone with weights unchanged.

## 2016-10-23 DIAGNOSIS — E1129 Type 2 diabetes mellitus with other diabetic kidney complication: Secondary | ICD-10-CM | POA: Diagnosis not present

## 2016-10-23 DIAGNOSIS — M109 Gout, unspecified: Secondary | ICD-10-CM | POA: Diagnosis not present

## 2016-10-23 DIAGNOSIS — N183 Chronic kidney disease, stage 3 (moderate): Secondary | ICD-10-CM | POA: Diagnosis not present

## 2016-10-23 DIAGNOSIS — I509 Heart failure, unspecified: Secondary | ICD-10-CM | POA: Diagnosis not present

## 2016-10-23 DIAGNOSIS — I10 Essential (primary) hypertension: Secondary | ICD-10-CM | POA: Diagnosis not present

## 2016-11-21 DIAGNOSIS — M19072 Primary osteoarthritis, left ankle and foot: Secondary | ICD-10-CM | POA: Diagnosis not present

## 2016-11-21 DIAGNOSIS — M25571 Pain in right ankle and joints of right foot: Secondary | ICD-10-CM | POA: Diagnosis not present

## 2016-12-04 DIAGNOSIS — E1129 Type 2 diabetes mellitus with other diabetic kidney complication: Secondary | ICD-10-CM | POA: Diagnosis not present

## 2016-12-04 DIAGNOSIS — Z1389 Encounter for screening for other disorder: Secondary | ICD-10-CM | POA: Diagnosis not present

## 2016-12-04 DIAGNOSIS — I5032 Chronic diastolic (congestive) heart failure: Secondary | ICD-10-CM | POA: Diagnosis not present

## 2016-12-04 DIAGNOSIS — I7389 Other specified peripheral vascular diseases: Secondary | ICD-10-CM | POA: Diagnosis not present

## 2016-12-04 DIAGNOSIS — N184 Chronic kidney disease, stage 4 (severe): Secondary | ICD-10-CM | POA: Diagnosis not present

## 2016-12-14 ENCOUNTER — Encounter (HOSPITAL_COMMUNITY): Payer: Self-pay | Admitting: Emergency Medicine

## 2016-12-14 ENCOUNTER — Emergency Department (HOSPITAL_COMMUNITY)
Admission: EM | Admit: 2016-12-14 | Discharge: 2016-12-14 | Disposition: A | Discharge status: Home / Self Care | Payer: Medicare Other | Attending: Emergency Medicine | Admitting: Emergency Medicine

## 2016-12-14 ENCOUNTER — Emergency Department (HOSPITAL_COMMUNITY): Payer: Medicare Other

## 2016-12-14 DIAGNOSIS — Z87891 Personal history of nicotine dependence: Secondary | ICD-10-CM | POA: Diagnosis not present

## 2016-12-14 DIAGNOSIS — E1122 Type 2 diabetes mellitus with diabetic chronic kidney disease: Secondary | ICD-10-CM | POA: Insufficient documentation

## 2016-12-14 DIAGNOSIS — R1030 Lower abdominal pain, unspecified: Secondary | ICD-10-CM | POA: Diagnosis not present

## 2016-12-14 DIAGNOSIS — I130 Hypertensive heart and chronic kidney disease with heart failure and stage 1 through stage 4 chronic kidney disease, or unspecified chronic kidney disease: Secondary | ICD-10-CM | POA: Diagnosis not present

## 2016-12-14 DIAGNOSIS — I2510 Atherosclerotic heart disease of native coronary artery without angina pectoris: Secondary | ICD-10-CM | POA: Diagnosis not present

## 2016-12-14 DIAGNOSIS — N183 Chronic kidney disease, stage 3 (moderate): Secondary | ICD-10-CM | POA: Insufficient documentation

## 2016-12-14 DIAGNOSIS — Z7982 Long term (current) use of aspirin: Secondary | ICD-10-CM | POA: Diagnosis not present

## 2016-12-14 DIAGNOSIS — R109 Unspecified abdominal pain: Secondary | ICD-10-CM

## 2016-12-14 DIAGNOSIS — Z79899 Other long term (current) drug therapy: Secondary | ICD-10-CM | POA: Insufficient documentation

## 2016-12-14 DIAGNOSIS — I5032 Chronic diastolic (congestive) heart failure: Secondary | ICD-10-CM | POA: Insufficient documentation

## 2016-12-14 DIAGNOSIS — K7689 Other specified diseases of liver: Secondary | ICD-10-CM | POA: Diagnosis not present

## 2016-12-14 LAB — CBC
HEMATOCRIT: 40.5 % (ref 39.0–52.0)
Hemoglobin: 13 g/dL (ref 13.0–17.0)
MCH: 33.6 pg (ref 26.0–34.0)
MCHC: 32.1 g/dL (ref 30.0–36.0)
MCV: 104.7 fL — AB (ref 78.0–100.0)
Platelets: 175 10*3/uL (ref 150–400)
RBC: 3.87 MIL/uL — ABNORMAL LOW (ref 4.22–5.81)
RDW: 15.1 % (ref 11.5–15.5)
WBC: 7.4 10*3/uL (ref 4.0–10.5)

## 2016-12-14 LAB — COMPREHENSIVE METABOLIC PANEL
ALBUMIN: 4.4 g/dL (ref 3.5–5.0)
ALT: 27 U/L (ref 17–63)
AST: 26 U/L (ref 15–41)
Alkaline Phosphatase: 78 U/L (ref 38–126)
Anion gap: 9 (ref 5–15)
BUN: 48 mg/dL — AB (ref 6–20)
CHLORIDE: 106 mmol/L (ref 101–111)
CO2: 24 mmol/L (ref 22–32)
Calcium: 9.8 mg/dL (ref 8.9–10.3)
Creatinine, Ser: 2.06 mg/dL — ABNORMAL HIGH (ref 0.61–1.24)
GFR calc Af Amer: 36 mL/min — ABNORMAL LOW (ref >60)
GFR calc non Af Amer: 31 mL/min — ABNORMAL LOW (ref >60)
GLUCOSE: 197 mg/dL — AB (ref 65–99)
POTASSIUM: 4.3 mmol/L (ref 3.5–5.1)
SODIUM: 139 mmol/L (ref 135–145)
Total Bilirubin: 0.5 mg/dL (ref 0.3–1.2)
Total Protein: 8.2 g/dL — ABNORMAL HIGH (ref 6.5–8.1)

## 2016-12-14 LAB — URINALYSIS, ROUTINE W REFLEX MICROSCOPIC
Bilirubin Urine: NEGATIVE
GLUCOSE, UA: NEGATIVE mg/dL
HGB URINE DIPSTICK: NEGATIVE
KETONES UR: NEGATIVE mg/dL
LEUKOCYTES UA: NEGATIVE
Nitrite: NEGATIVE
PH: 5.5 (ref 5.0–8.0)
Protein, ur: NEGATIVE mg/dL
Specific Gravity, Urine: <1.005 — ABNORMAL LOW (ref 1.005–1.030)

## 2016-12-14 LAB — LIPASE, BLOOD: LIPASE: 31 U/L (ref 11–51)

## 2016-12-14 MED ORDER — HYDROCODONE-ACETAMINOPHEN 5-325 MG PO TABS: 1.0000 | ORAL_TABLET | ORAL | 0 refills | Status: DC | PRN

## 2016-12-14 MED ORDER — SODIUM CHLORIDE 0.9 % IV BOLUS (SEPSIS)
500.0000 mL | Freq: Once | INTRAVENOUS | Status: AC
Start: 2016-12-14 — End: 2016-12-14
  Administered 2016-12-14: 500 mL via INTRAVENOUS

## 2016-12-14 MED ORDER — ONDANSETRON HCL 8 MG PO TABS: 8.0000 mg | ORAL_TABLET | ORAL | 0 refills | Status: DC | PRN

## 2016-12-14 MED ORDER — IOPAMIDOL (ISOVUE-300) INJECTION 61%
INTRAVENOUS | Status: AC
  Filled 2016-12-14: qty 30

## 2016-12-14 MED ORDER — MORPHINE SULFATE (PF) 4 MG/ML IV SOLN
4.0000 mg | Freq: Once | INTRAVENOUS | Status: AC
Start: 2016-12-14 — End: 2016-12-14
  Administered 2016-12-14: 4 mg via INTRAVENOUS
  Filled 2016-12-14: qty 1

## 2016-12-14 MED ORDER — SODIUM CHLORIDE 0.9 % IV BOLUS (SEPSIS)
500.0000 mL | Freq: Once | INTRAVENOUS | Status: AC
  Administered 2016-12-14: 500 mL via INTRAVENOUS

## 2016-12-14 MED ORDER — ONDANSETRON HCL 4 MG/2ML IJ SOLN
4.0000 mg | Freq: Once | INTRAMUSCULAR | Status: AC
  Administered 2016-12-14: 4 mg via INTRAVENOUS
  Filled 2016-12-14: qty 2

## 2016-12-14 NOTE — Discharge Instructions (Signed)
CT scan revealed no life-threatening condition. You have some lesions on your liver, but this is not new. This can be followed up with your primary care doctor. Medication for pain and nausea. Return if worse.

## 2016-12-14 NOTE — ED Provider Notes (Signed)
[facility] DEPT Provider Note   CSN: [id number] Arrival date & time: 12/14/16  2683  By signing my name below, I, [name], attest that this documentation has been prepared under the direction and in the presence of [name], MD. Electronically Signed: Soijett [name], ED Scribe. 12/14/16. 11:10 AM.  History   Chief Complaint Chief Complaint  Patient presents with  . Abdominal Pain    HPI [name] is a 70 y.o. male with a PMHx of stage 3 CKD, DM, HTN, who presents to the Emergency Department complaining of lower abdominal pain onset last night. He is having associated symptoms of nausea, vomiting x last night, and mild subjective fever. He has not tried any medications for the relief of his symptoms. He denies diarrhea, urinary symptoms, and any other symptoms. Pt notes that he has had a nephrectomy due to HTN and he denies being on dialysis. Denies abdominal surgeries.   The history is provided by the patient. No language interpreter was used.    Past Medical History:  Diagnosis Date  . Anxiety   . Benign prostatic hypertrophy    Nocturia  . CAD (coronary artery disease)    a.  NSTEMI 10/2012 s/p DES to LAD & DES to 1st diagonal with residual diffuse nonobstructive dz in LCx/RCA   . Chronic kidney disease, stage 3, mod decreased GFR    Creatinine of 1.5 in 03/2009  . Degenerative joint disease   . Depression    hx of  . Diabetes mellitus, type II ([place]) 12/10/2012.  . Diastolic heart failure    Pedal edema, "a long time ago"  . Erectile dysfunction   . Flu 2013   hx of  . GERD (gastroesophageal reflux disease)   . Hemorrhoids   . High cholesterol   . Hypertension    Exercise induced  . NSVT (nonsustained ventricular tachycardia) (HCC)    Exercise induced  . Obesity   . 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 Tubular adenoma 2014    Patient Active Problem List   Diagnosis Date Noted  . Peripheral edema   . CHF (congestive heart  failure) ([place]) 07/05/2015  . Acute congestive heart failure ([place]) 07/04/2015  . Acute on chronic diastolic congestive heart failure ([place]) 07/04/2015  . HLD (hyperlipidemia) 07/04/2015  . Anxiety state 07/04/2015  . GERD (gastroesophageal reflux disease) 07/04/2015  . Heart block 07/04/2015  . Edema 03/11/2015  . Diastolic CHF, chronic ([place]) 03/11/2015  . 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 Muscle weakness (generalized) 01/13/2014  . Knee stiffness 01/13/2014  . Gout 12/11/2013  . Cellulitis 11/19/2013  . Expected blood loss anemia 11/17/2013  . Obese 11/17/2013  . S/P right TKA 11/15/2013  . Anemia, normocytic normochromic 01/12/2013  . Diabetes mellitus type 2 in obese ([place]) 12/10/2012  . CAD S/P percutaneous coronary angioplasty 11/24/2012  . Overweight(278.02) 11/17/2012  . Obstructive sleep apnea 11/15/2012  . Chronic kidney disease, stage 3, cardiorenal syndrome 08/23/2011  . Essential hypertension     Past Surgical History:  Procedure Laterality Date  . BACK SURGERY  2007   x2  . CARDIAC CATHETERIZATION     with stent placement  . COLONOSCOPY WITH ESOPHAGOGASTRODUODENOSCOPY (EGD) N/A 11/23/2013   Dr. Oneida Alar- TCS= left sided colitis and mild proctitis, moderate internal hemorrhoids, small nodule in the rectum, bx=tubular adenoma, EGD=-mild non-erosive gastritis  . HERNIA REPAIR    . LEFT HEART CATHETERIZATION WITH CORONARY ANGIOGRAM N/A 11/16/2012   Procedure:  LEFT HEART CATHETERIZATION WITH CORONARY ANGIOGRAM;  Surgeon: Sherren Mocha, MD;  Location: [facility] CATH LAB;  Service: Cardiovascular;  Laterality: N/A;  . PERCUTANEOUS CORONARY STENT INTERVENTION (PCI-S)  11/16/2012   Procedure: PERCUTANEOUS CORONARY STENT INTERVENTION (PCI-S);  Surgeon: Sherren Mocha, MD;  Location: [facility] C[facility] LAB;  Service: Cardiovascular;;  . TOTAL KNEE ARTHROPLASTY Right 11/15/2013   Procedure: RIGHT TOTAL KNEE ARTHROPLASTY;  Surgeon: Mauri Pole, MD;  Location: [facility] ORS;  Service: Orthopedics;  Laterality:  Right;  . TOTAL KNEE ARTHROPLASTY Left 03/07/2015   Procedure: LEFT TOTAL KNEE ARTHROPLASTY;  Surgeon: Paralee Cancel, MD;  Location: [facility] ORS;  Service: Orthopedics;  Laterality: Left;       Home Medications    Prior to Admission medications   Medication Sig Start Date End Date Taking? Authorizing Provider  acetaminophen (TYLENOL) 325 MG tablet Take 1-2 tablets (325-650 mg total) by mouth every 6 (six) hours as needed. 11/17/13  Yes Danae Orleans, PA-C  allopurinol (ZYLOPRIM) 300 MG tablet Take 300 mg by mouth every morning.    Yes Historical Provider, MD  ALPRAZolam Duanne Moron) 0.5 MG tablet Take one tablet by mouth twice daily as needed for anxiety Patient taking differently: Take 0.5 mg by mouth 3 (three) times daily.  03/09/15  Yes Matthew Babish, PA-C  amLODipine (NORVASC) 10 MG tablet Take 1 tablet by mouth daily. 10/08/16  Yes Historical Provider, MD  aspirin EC 81 MG tablet Take 81 mg by mouth daily.   Yes Historical Provider, MD  calcitRIOL (ROCALTROL) 0.25 MCG capsule Take 1 capsule by mouth daily. 12/13/16  Yes Historical Provider, MD  DULoxetine (CYMBALTA) 30 MG capsule Take 1 capsule by mouth daily. 09/18/16  Yes Historical Provider, MD  ferrous sulfate 325 (65 FE) MG tablet Take 1 tablet (325 mg total) by mouth 3 (three) times daily after meals. Patient taking differently: Take 325 mg by mouth daily with breakfast.  03/09/15  Yes Danae Orleans, PA-C  gabapentin (NEURONTIN) 300 MG capsule Take 300 mg by mouth 2 (two) times daily.    Yes Historical Provider, MD  NITROSTAT 0.4 MG SL tablet DISSOLVE ONE TABLET UNDER THE TONGUE AS NEEDED FOR CHEST PAIN.   Yes Herminio Commons, MD  omeprazole (PRILOSEC) 20 MG capsule Take 20 mg by mouth every morning.  01/27/14  Yes Historical Provider, MD  pravastatin (PRAVACHOL) 20 MG tablet Take 20 mg by mouth every morning.    Yes Historical Provider, MD  saxagliptin HCl (ONGLYZA) 2.5 MG TABS tablet Take 2.5 mg by mouth every morning.   Yes Historical  Provider, MD  torsemide (DEMADEX) 20 MG tablet Take 2 tablets (40 mg total) by mouth 2 (two) times daily. Start on 7/19 10/14/16  Yes Imogene Burn, PA-C  HYDROcodone-acetaminophen (NORCO/VICODIN) 5-325 MG tablet Take 1-2 tablets by mouth every 4 (four) hours as needed. 12/14/16   [name], MD  metolazone (ZAROXOLYN) 2.5 MG tablet Take 1 tablet (2.5 mg total) by mouth daily. 10/02/16 10/05/16  Imogene Burn, PA-C  ondansetron (ZOFRAN) 8 MG tablet Take 1 tablet (8 mg total) by mouth every 4 (four) hours as needed. 12/14/16   [name], MD    Family History Family History  Problem Relation Age of Onset  . Hypertension Other   . Cancer Other   . Heart disease    . Diabetes    . Colon cancer Neg Hx     Social History Social History  Substance Use Topics  . Smoking status: Former Smoker  Packs/day: 1.00    Years: 30.00    Types: Cigarettes    Start date: 01/22/1965    Quit date: 12/23/1992  . Smokeless tobacco: Never Used  . Alcohol use No     Allergies   Niacin and related   Review of Systems Review of Systems  A complete 10 system review of systems was obtained and all systems are negative except as noted in the HPI and PMH.   Physical Exam Updated Vital Signs BP 142/69   Pulse 80   Temp 97.5 F (36.4 C) (Oral)   Resp 22   Ht 6' 3"  (1.905 m)   Wt 275 lb (124.7 kg)   SpO2 95%   BMI 34.37 kg/m   Physical Exam  Constitutional: He is oriented to person, place, and time. He appears well-developed and well-nourished.  HENT:  Head: Normocephalic and atraumatic.  Eyes: Conjunctivae are normal.  Neck: Neck supple.  Cardiovascular: Normal rate and regular rhythm.   Pulmonary/Chest: Effort normal and breath sounds normal.  Abdominal: Soft. Bowel sounds are normal. There is tenderness.  Minimally tender to lower abdomen  Musculoskeletal: Normal range of motion.  Neurological: He is alert and oriented to person, place, and time.  Skin: Skin is warm and dry.    Psychiatric: He has a normal mood and affect. His behavior is normal.  Nursing note and vitals reviewed.    ED Treatments / Results  DIAGNOSTIC STUDIES: Oxygen Saturation is 95% on RA, adequate by my interpretation.    COORDINATION OF CARE: 11:07 AM Discussed treatment plan with pt at bedside which includes labs, UA, CT abdomen pelvis, morphine, zofran, fluids, and pt agreed to plan.   Labs (all labs ordered are listed, but only abnormal results are displayed) Labs Reviewed  COMPREHENSIVE METABOLIC PANEL - Abnormal; Notable for the following:       Result Value   Glucose, Bld 197 (*)    BUN 48 (*)    Creatinine, Ser 2.06 (*)    Total Protein 8.2 (*)    GFR calc non Af Amer 31 (*)    GFR calc Af Amer 36 (*)    All other components within normal limits  CBC - Abnormal; Notable for the following:    RBC 3.87 (*)    MCV 104.7 (*)    All other components within normal limits  URINALYSIS, ROUTINE W REFLEX MICROSCOPIC - Abnormal; Notable for the following:    Specific Gravity, Urine <1.005 (*)    All other components within normal limits  LIPASE, BLOOD    EKG  EKG Interpretation None       Radiology Ct Abdomen Pelvis Wo Contrast  Result Date: 12/14/2016 CLINICAL DATA:  Lower abdominal pain, nausea, vomiting and fever since last night, history BPH, coronary disease, stage III chronic kidney disease, type II diabetes mellitus, hypertension, former smoker EXAM: CT ABDOMEN AND PELVIS WITHOUT CONTRAST TECHNIQUE: Multidetector CT imaging of the abdomen and pelvis was performed following the standard protocol without IV contrast. Sagittal and coronal MPR images reconstructed from axial data set. Patient drank a decreased volume of dilute oral contrast for exam. COMPARISON:  01/11/2009 FINDINGS: Lower chest: Dependent atelectasis BILATERAL lower lobes Hepatobiliary: Multiple low-attenuation foci within liver largest 12 x 12 mm image 24. Improve image quality versus previous exam,  with many of these lesions present on the previous exam but now better delineated. Several of the smaller nodules are not definitely seen previously. Question dependent calculi in gallbladder. Pancreas: Normal appearance Spleen: Normal  appearance Adrenals/Urinary Tract: Adrenal glands normal appearance. Potential cyst medial RIGHT kidney 18 x 16 mm image 37. Multiple small cysts LEFT kidney. No urinary tract calcification, hydronephrosis, or ureteral dilatation. Bladder unremarkable. Prostate gland enlarged 6.9 x 4.5 x 4.2 cm (volume = 68 cm3). Stomach/Bowel: Mildly increased stool in RIGHT colon. Normal appendix. Question mild rectal wall thickening. Stomach and bowel loops otherwise normal appearance. Vascular/Lymphatic: Atherosclerotic calcifications aorta without aneurysm. Coronary arterial calcifications. No adenopathy. Reproductive: N/A Other: No free air or free fluid. Question tiny LEFT inguinal hernia containing fat. Musculoskeletal: Prior lumbar fusion L2-L5. No acute osseous findings. IMPRESSION: Multiple low-attenuation liver lesions, her better defined than on the previous exam ; many of these lesions were present previously, with some of the smaller lesions on current study not definitely seen on the previous study. BILATERAL renal cysts. Aortic atherosclerosis and coronary arterial calcification. Question cholelithiasis. Prostatic enlargement. Question tiny LEFT inguinal hernia containing fat. Electronically Signed   By: Lavonia Dana M.D.   On: 12/14/2016 15:02    Procedures Procedures (including critical care time)  Medications Ordered in ED Medications  iopamidol (ISOVUE-300) 61 % injection (not administered)  sodium chloride 0.9 % bolus 500 mL (500 mLs Intravenous New Bag/Given 12/14/16 1035)  ondansetron (ZOFRAN) injection 4 mg (4 mg Intravenous Given 12/14/16 1035)  morphine 4 MG/ML injection 4 mg (4 mg Intravenous Given 12/14/16 1035)  sodium chloride 0.9 % bolus 500 mL (500 mLs  Intravenous New Bag/Given 12/14/16 1331)     Initial Impression / Assessment and Plan / ED Course  I have reviewed the triage vital signs and the nursing notes.  Pertinent labs & imaging results that were available during my care of the patient were reviewed by me and considered in my medical decision making (see chart for details).  Clinical Course     No acute abdomen. Patient feels better after IV fluids and pain management. White count normal. CT scan revealed liver lesions but these are not new. These findings were discussed with the patient. He has primary care follow-up. Discharge medications Norco and no phrenic milligrams  Final Clinical Impressions(s) / ED Diagnoses   Final diagnoses:  Abdominal pain, unspecified abdominal location    New Prescriptions New Prescriptions   HYDROCODONE-ACETAMINOPHEN (NORCO/VICODIN) 5-325 MG TABLET    Take 1-2 tablets by mouth every 4 (four) hours as needed.   ONDANSETRON (ZOFRAN) 8 MG TABLET    Take 1 tablet (8 mg total) by mouth every 4 (four) hours as needed.   I personally performed the services described in this documentation, which was scribed in my presence. The recorded information has been reviewed and is accurate.      [name], MD 12/14/16 [phone]

## 2016-12-14 NOTE — ED Triage Notes (Signed)
Patient c/o generalized abd pain with nausea, vomiting, and diarrhea that started last night. Per patient vomiting started first. Denies any fever or blood in stools.

## 2016-12-27 DIAGNOSIS — Z1389 Encounter for screening for other disorder: Secondary | ICD-10-CM | POA: Diagnosis not present

## 2016-12-27 DIAGNOSIS — J329 Chronic sinusitis, unspecified: Secondary | ICD-10-CM | POA: Diagnosis not present

## 2016-12-27 DIAGNOSIS — E119 Type 2 diabetes mellitus without complications: Secondary | ICD-10-CM | POA: Diagnosis not present

## 2016-12-27 DIAGNOSIS — G894 Chronic pain syndrome: Secondary | ICD-10-CM | POA: Diagnosis not present

## 2016-12-27 DIAGNOSIS — M1991 Primary osteoarthritis, unspecified site: Secondary | ICD-10-CM | POA: Diagnosis not present

## 2017-01-07 ENCOUNTER — Encounter: Payer: Self-pay | Admitting: Cardiovascular Disease

## 2017-01-07 ENCOUNTER — Ambulatory Visit: Payer: Self-pay | Admitting: Cardiovascular Disease

## 2017-02-10 DIAGNOSIS — N183 Chronic kidney disease, stage 3 (moderate): Secondary | ICD-10-CM | POA: Diagnosis not present

## 2017-02-10 DIAGNOSIS — E559 Vitamin D deficiency, unspecified: Secondary | ICD-10-CM | POA: Diagnosis not present

## 2017-02-10 DIAGNOSIS — R809 Proteinuria, unspecified: Secondary | ICD-10-CM | POA: Diagnosis not present

## 2017-02-10 DIAGNOSIS — D509 Iron deficiency anemia, unspecified: Secondary | ICD-10-CM | POA: Diagnosis not present

## 2017-02-10 DIAGNOSIS — Z79899 Other long term (current) drug therapy: Secondary | ICD-10-CM | POA: Diagnosis not present

## 2017-02-10 DIAGNOSIS — I10 Essential (primary) hypertension: Secondary | ICD-10-CM | POA: Diagnosis not present

## 2017-02-17 ENCOUNTER — Ambulatory Visit (INDEPENDENT_AMBULATORY_CARE_PROVIDER_SITE_OTHER): Payer: Medicare Other | Admitting: Cardiovascular Disease

## 2017-02-17 ENCOUNTER — Encounter: Payer: Self-pay | Admitting: Cardiovascular Disease

## 2017-02-17 VITALS — BP 144/78 | HR 85 | Ht 75.0 in | Wt 304.0 lb

## 2017-02-17 DIAGNOSIS — N183 Chronic kidney disease, stage 3 unspecified: Secondary | ICD-10-CM

## 2017-02-17 DIAGNOSIS — I10 Essential (primary) hypertension: Secondary | ICD-10-CM

## 2017-02-17 DIAGNOSIS — E7800 Pure hypercholesterolemia, unspecified: Secondary | ICD-10-CM

## 2017-02-17 DIAGNOSIS — I5032 Chronic diastolic (congestive) heart failure: Secondary | ICD-10-CM | POA: Diagnosis not present

## 2017-02-17 DIAGNOSIS — I2510 Atherosclerotic heart disease of native coronary artery without angina pectoris: Secondary | ICD-10-CM

## 2017-02-17 NOTE — Patient Instructions (Signed)
Your physician wants you to follow-up in: 6 months Dr Virgina Jock will receive a reminder letter in the mail two months in advance. If you don't receive a letter, please call our office to schedule the follow-up appointment.    Your physician recommends that you continue on your current medications as directed. Please refer to the Current Medication list given to you today.     If you need a refill on your cardiac medications before your next appointment, please call your pharmacy.     Thank you for choosing [facility] !

## 2017-02-17 NOTE — Progress Notes (Signed)
SUBJECTIVE: [name] presents for routine follow up. He has a history of hypertension and hypertensive heart disease as well as coronary artery disease and sustained a NSTEMI on 11/16/2012 and received a drug-eluting stent to the LAD and D1, with residual diffuse nonobstructive disease in the RCA and LCx.  He also has CKD, sleep apnea, obesity, and chronic diastolic heart failure.  Most recent echocardiogram ion 07/06/15 demonstrated normal left ventricular systolic function, EF 07-62%, and grade 2 diastolic dysfunction.  Wt 304 lbs (308 lbs 09/25/16).  He is feeling well and denies chest pain, palpitations, and shortness of breath. He exercises at the [facility] 4 days per week using the elliptical and burns 200 calories each time. Has mild leg swelling.   Review of Systems: As per "subjective", otherwise negative.  Allergies  Allergen Reactions  . Niacin And Related Other (See Comments)    Reaction: flushing and burning side effects even with extended release    Current Outpatient Prescriptions  Medication Sig Dispense Refill  . acetaminophen (TYLENOL) 325 MG tablet Take 1-2 tablets (325-650 mg total) by mouth every 6 (six) hours as needed.    Marland Kitchen allopurinol (ZYLOPRIM) 300 MG tablet Take 300 mg by mouth every morning.     Marland Kitchen ALPRAZolam (XANAX) 0.5 MG tablet Take one tablet by mouth twice daily as needed for anxiety (Patient taking differently: Take 0.5 mg by mouth 3 (three) times daily. ) 60 tablet 5  . amLODipine (NORVASC) 10 MG tablet Take 1 tablet by mouth daily.    Marland Kitchen aspirin EC 81 MG tablet Take 81 mg by mouth daily.    . calcitRIOL (ROCALTROL) 0.25 MCG capsule Take 1 capsule by mouth daily.    . DULoxetine (CYMBALTA) 30 MG capsule Take 1 capsule by mouth daily.    . ferrous sulfate 325 (65 FE) MG tablet Take 1 tablet (325 mg total) by mouth 3 (three) times daily after meals. (Patient taking differently: Take 325 mg by mouth daily with breakfast. )  3  . gabapentin (NEURONTIN)  300 MG capsule Take 300 mg by mouth 2 (two) times daily.     Marland Kitchen HYDROcodone-acetaminophen (NORCO/VICODIN) 5-325 MG tablet Take 1-2 tablets by mouth every 4 (four) hours as needed. 15 tablet 0  . NITROSTAT 0.4 MG SL tablet DISSOLVE ONE TABLET UNDER THE TONGUE AS NEEDED FOR CHEST PAIN. 25 tablet 1  . omeprazole (PRILOSEC) 20 MG capsule Take 20 mg by mouth every morning.     . ondansetron (ZOFRAN) 8 MG tablet Take 1 tablet (8 mg total) by mouth every 4 (four) hours as needed. 8 tablet 0  . pravastatin (PRAVACHOL) 20 MG tablet Take 20 mg by mouth every morning.     . saxagliptin HCl (ONGLYZA) 2.5 MG TABS tablet Take 2.5 mg by mouth every morning.    . torsemide (DEMADEX) 20 MG tablet Take 2 tablets (40 mg total) by mouth 2 (two) times daily. Start on 7/19 360 tablet 3   No current facility-administered medications for this visit.     Past Medical History:  Diagnosis Date  . Anxiety   . Benign prostatic hypertrophy    Nocturia  . CAD (coronary artery disease)    a.  NSTEMI 10/2012 s/p DES to LAD & DES to 1st diagonal with residual diffuse nonobstructive dz in LCx/RCA   . Chronic kidney disease, stage 3, mod decreased GFR    Creatinine of 1.5 in 03/2009  . Degenerative joint disease   . Depression  hx of  . Diabetes mellitus, type II ([place]) 12/10/2012.  . Diastolic heart failure    Pedal edema, "a long time ago"  . Erectile dysfunction   . Flu 2013   hx of  . GERD (gastroesophageal reflux disease)   . Hemorrhoids   . High cholesterol   . Hypertension    Exercise induced  . NSVT (nonsustained ventricular tachycardia) (HCC)    Exercise induced  . Obesity   . 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 Tubular adenoma 2014    Past Surgical History:  Procedure Laterality Date  . BACK SURGERY  2007   x2  . CARDIAC CATHETERIZATION     with stent placement  . COLONOSCOPY WITH ESOPHAGOGASTRODUODENOSCOPY (EGD) N/A 11/23/2013   Dr. Oneida Alar- TCS= left sided colitis and  mild proctitis, moderate internal hemorrhoids, small nodule in the rectum, bx=tubular adenoma, EGD=-mild non-erosive gastritis  . HERNIA REPAIR    . LEFT HEART CATHETERIZATION WITH CORONARY ANGIOGRAM N/A 11/16/2012   Procedure: LEFT HEART CATHETERIZATION WITH CORONARY ANGIOGRAM;  Surgeon: Sherren Mocha, MD;  Location: [facility] CATH LAB;  Service: Cardiovascular;  Laterality: N/A;  . PERCUTANEOUS CORONARY STENT INTERVENTION (PCI-S)  11/16/2012   Procedure: PERCUTANEOUS CORONARY STENT INTERVENTION (PCI-S);  Surgeon: Sherren Mocha, MD;  Location: [facility] CATH LAB;  Service: Cardiovascular;;  . TOTAL KNEE ARTHROPLASTY Right 11/15/2013   Procedure: RIGHT TOTAL KNEE ARTHROPLASTY;  Surgeon: Mauri Pole, MD;  Location: [facility] ORS;  Service: Orthopedics;  Laterality: Right;  . TOTAL KNEE ARTHROPLASTY Left 03/07/2015   Procedure: LEFT TOTAL KNEE ARTHROPLASTY;  Surgeon: Paralee Cancel, MD;  Location: [facility] ORS;  Service: Orthopedics;  Laterality: Left;    Social History   Social History  . Marital status: Widowed    Spouse name: N/A  . Number of children: N/A  . Years of education: N/A   Occupational History  . Disabled Retired   Social History Main Topics  . Smoking status: Former Smoker    Packs/day: 1.00    Years: 30.00    Types: Cigarettes    Start date: 01/22/1965    Quit date: 12/23/1992  . Smokeless tobacco: Never Used  . Alcohol use No  . Drug use: No  . Sexual activity: Not Currently   Other Topics Concern  . Not on file   Social History Narrative   Married     Vitals:   02/17/17 1026  BP: (!) 144/78  Pulse: 85  SpO2: 94%  Weight: (!) 304 lb (137.9 kg)  Height: 6' 3"  (1.905 m)    PHYSICAL EXAM General: NAD HEENT: Normal. Neck: No JVD, no thyromegaly. Lungs: Clear to auscultation bilaterally with normal respiratory effort. CV: Nondisplaced PMI.  Regular rate and rhythm, normal S1/S2, no S3/S4, no murmur. Trace pretibial b/l edema.  No carotid bruit.   Abdomen: Soft, nontender, no  distention.  Neurologic: Alert and oriented.  Psych: Normal affect. Skin: Normal. Musculoskeletal: No gross deformities.    ECG: Most recent ECG reviewed.      ASSESSMENT AND PLAN: 1. Chronic diastolic heart failure: On torsemide 40 mg bid. No changes.  2. CAD s/p PCI of LAD and D1: Stable ischemic heart disease. Continue aspirin 81 mg and pravastatin 20 mg daily. Metoprolol previously stopped due to bradycardia.  3. Essential HTN: Mildly elevated. No changes.  4. Hyperlipidemia: Continue pravastatin 20 mg.   5. First degree AV block and bradycardia: Resolved with discontinuation of metoprolol.  Dispo: fu 6 months.  Kate Sable, M.D., F.A.C.C.

## 2017-02-18 DIAGNOSIS — N183 Chronic kidney disease, stage 3 (moderate): Secondary | ICD-10-CM | POA: Diagnosis not present

## 2017-02-18 DIAGNOSIS — I509 Heart failure, unspecified: Secondary | ICD-10-CM | POA: Diagnosis not present

## 2017-02-18 DIAGNOSIS — M109 Gout, unspecified: Secondary | ICD-10-CM | POA: Diagnosis not present

## 2017-02-18 DIAGNOSIS — I10 Essential (primary) hypertension: Secondary | ICD-10-CM | POA: Diagnosis not present

## 2017-02-18 DIAGNOSIS — E1129 Type 2 diabetes mellitus with other diabetic kidney complication: Secondary | ICD-10-CM | POA: Diagnosis not present

## 2017-03-06 DIAGNOSIS — L03115 Cellulitis of right lower limb: Secondary | ICD-10-CM | POA: Diagnosis not present

## 2017-03-11 ENCOUNTER — Other Ambulatory Visit (HOSPITAL_COMMUNITY): Payer: Self-pay | Admitting: Cardiovascular Disease

## 2017-03-20 DIAGNOSIS — E1129 Type 2 diabetes mellitus with other diabetic kidney complication: Secondary | ICD-10-CM | POA: Diagnosis not present

## 2017-03-20 DIAGNOSIS — I10 Essential (primary) hypertension: Secondary | ICD-10-CM | POA: Diagnosis not present

## 2017-03-20 DIAGNOSIS — M62830 Muscle spasm of back: Secondary | ICD-10-CM | POA: Diagnosis not present

## 2017-03-20 DIAGNOSIS — N184 Chronic kidney disease, stage 4 (severe): Secondary | ICD-10-CM | POA: Diagnosis not present

## 2017-03-20 DIAGNOSIS — M5417 Radiculopathy, lumbosacral region: Secondary | ICD-10-CM | POA: Diagnosis not present

## 2017-03-20 DIAGNOSIS — L0390 Cellulitis, unspecified: Secondary | ICD-10-CM | POA: Diagnosis not present

## 2017-04-14 DIAGNOSIS — G894 Chronic pain syndrome: Secondary | ICD-10-CM | POA: Diagnosis not present

## 2017-04-14 DIAGNOSIS — I10 Essential (primary) hypertension: Secondary | ICD-10-CM | POA: Diagnosis not present

## 2017-05-22 DIAGNOSIS — Z1389 Encounter for screening for other disorder: Secondary | ICD-10-CM | POA: Diagnosis not present

## 2017-05-22 DIAGNOSIS — E1129 Type 2 diabetes mellitus with other diabetic kidney complication: Secondary | ICD-10-CM | POA: Diagnosis not present

## 2017-05-22 DIAGNOSIS — I10 Essential (primary) hypertension: Secondary | ICD-10-CM | POA: Diagnosis not present

## 2017-05-22 DIAGNOSIS — G894 Chronic pain syndrome: Secondary | ICD-10-CM | POA: Diagnosis not present

## 2017-05-27 DIAGNOSIS — M1991 Primary osteoarthritis, unspecified site: Secondary | ICD-10-CM | POA: Diagnosis not present

## 2017-05-27 DIAGNOSIS — E1129 Type 2 diabetes mellitus with other diabetic kidney complication: Secondary | ICD-10-CM | POA: Diagnosis not present

## 2017-05-27 DIAGNOSIS — I10 Essential (primary) hypertension: Secondary | ICD-10-CM | POA: Diagnosis not present

## 2017-05-27 DIAGNOSIS — Z1389 Encounter for screening for other disorder: Secondary | ICD-10-CM | POA: Diagnosis not present

## 2017-05-27 DIAGNOSIS — Z0001 Encounter for general adult medical examination with abnormal findings: Secondary | ICD-10-CM | POA: Diagnosis not present

## 2017-06-06 DIAGNOSIS — Z1389 Encounter for screening for other disorder: Secondary | ICD-10-CM | POA: Diagnosis not present

## 2017-06-06 DIAGNOSIS — L03032 Cellulitis of left toe: Secondary | ICD-10-CM | POA: Diagnosis not present

## 2017-06-06 DIAGNOSIS — S90212A Contusion of left great toe with damage to nail, initial encounter: Secondary | ICD-10-CM | POA: Diagnosis not present

## 2017-06-11 DIAGNOSIS — I10 Essential (primary) hypertension: Secondary | ICD-10-CM | POA: Diagnosis not present

## 2017-06-11 DIAGNOSIS — D509 Iron deficiency anemia, unspecified: Secondary | ICD-10-CM | POA: Diagnosis not present

## 2017-06-11 DIAGNOSIS — R809 Proteinuria, unspecified: Secondary | ICD-10-CM | POA: Diagnosis not present

## 2017-06-11 DIAGNOSIS — Z79899 Other long term (current) drug therapy: Secondary | ICD-10-CM | POA: Diagnosis not present

## 2017-06-11 DIAGNOSIS — N183 Chronic kidney disease, stage 3 (moderate): Secondary | ICD-10-CM | POA: Diagnosis not present

## 2017-06-12 DIAGNOSIS — G894 Chronic pain syndrome: Secondary | ICD-10-CM | POA: Diagnosis not present

## 2017-06-12 DIAGNOSIS — M1991 Primary osteoarthritis, unspecified site: Secondary | ICD-10-CM | POA: Diagnosis not present

## 2017-06-12 DIAGNOSIS — E1140 Type 2 diabetes mellitus with diabetic neuropathy, unspecified: Secondary | ICD-10-CM | POA: Diagnosis not present

## 2017-06-17 DIAGNOSIS — M109 Gout, unspecified: Secondary | ICD-10-CM | POA: Diagnosis not present

## 2017-06-17 DIAGNOSIS — I10 Essential (primary) hypertension: Secondary | ICD-10-CM | POA: Diagnosis not present

## 2017-06-17 DIAGNOSIS — N183 Chronic kidney disease, stage 3 (moderate): Secondary | ICD-10-CM | POA: Diagnosis not present

## 2017-06-17 DIAGNOSIS — I509 Heart failure, unspecified: Secondary | ICD-10-CM | POA: Diagnosis not present

## 2017-06-17 DIAGNOSIS — D649 Anemia, unspecified: Secondary | ICD-10-CM | POA: Diagnosis not present

## 2017-06-26 DIAGNOSIS — Z96651 Presence of right artificial knee joint: Secondary | ICD-10-CM | POA: Diagnosis not present

## 2017-06-26 DIAGNOSIS — Z96652 Presence of left artificial knee joint: Secondary | ICD-10-CM | POA: Diagnosis not present

## 2017-07-01 DIAGNOSIS — B351 Tinea unguium: Secondary | ICD-10-CM | POA: Diagnosis not present

## 2017-07-01 DIAGNOSIS — E1140 Type 2 diabetes mellitus with diabetic neuropathy, unspecified: Secondary | ICD-10-CM | POA: Diagnosis not present

## 2017-07-01 DIAGNOSIS — L03031 Cellulitis of right toe: Secondary | ICD-10-CM | POA: Diagnosis not present

## 2017-07-01 DIAGNOSIS — L84 Corns and callosities: Secondary | ICD-10-CM | POA: Diagnosis not present

## 2017-07-22 ENCOUNTER — Other Ambulatory Visit (HOSPITAL_COMMUNITY): Payer: Self-pay | Admitting: Cardiovascular Disease

## 2017-07-22 DIAGNOSIS — L89892 Pressure ulcer of other site, stage 2: Secondary | ICD-10-CM | POA: Diagnosis not present

## 2017-08-05 DIAGNOSIS — L89892 Pressure ulcer of other site, stage 2: Secondary | ICD-10-CM | POA: Diagnosis not present

## 2017-08-08 DIAGNOSIS — M545 Low back pain: Secondary | ICD-10-CM | POA: Diagnosis not present

## 2017-08-08 DIAGNOSIS — E11621 Type 2 diabetes mellitus with foot ulcer: Secondary | ICD-10-CM | POA: Diagnosis not present

## 2017-08-19 DIAGNOSIS — M25579 Pain in unspecified ankle and joints of unspecified foot: Secondary | ICD-10-CM | POA: Diagnosis not present

## 2017-08-19 DIAGNOSIS — M79672 Pain in left foot: Secondary | ICD-10-CM | POA: Diagnosis not present

## 2017-09-02 DIAGNOSIS — G894 Chronic pain syndrome: Secondary | ICD-10-CM | POA: Diagnosis not present

## 2017-09-02 DIAGNOSIS — I872 Venous insufficiency (chronic) (peripheral): Secondary | ICD-10-CM | POA: Diagnosis not present

## 2017-09-02 DIAGNOSIS — Z23 Encounter for immunization: Secondary | ICD-10-CM | POA: Diagnosis not present

## 2017-09-02 DIAGNOSIS — R600 Localized edema: Secondary | ICD-10-CM | POA: Diagnosis not present

## 2017-10-06 ENCOUNTER — Other Ambulatory Visit: Payer: Self-pay | Admitting: Cardiovascular Disease

## 2017-10-08 ENCOUNTER — Telehealth: Payer: Self-pay | Admitting: Cardiovascular Disease

## 2017-10-08 NOTE — Telephone Encounter (Signed)
Pharmacy notified that current dose is 10 mg of Norvasc

## 2017-10-08 NOTE — Telephone Encounter (Signed)
Please give Tammy at [facility] a call @  [phone] called concerning the pt's amLODipine (NORVASC) 10 MG tablet [[id number]]

## 2017-10-09 DIAGNOSIS — E119 Type 2 diabetes mellitus without complications: Secondary | ICD-10-CM | POA: Diagnosis not present

## 2017-10-09 DIAGNOSIS — E1140 Type 2 diabetes mellitus with diabetic neuropathy, unspecified: Secondary | ICD-10-CM | POA: Diagnosis not present

## 2017-10-13 DIAGNOSIS — E1151 Type 2 diabetes mellitus with diabetic peripheral angiopathy without gangrene: Secondary | ICD-10-CM | POA: Diagnosis not present

## 2017-10-13 DIAGNOSIS — E1140 Type 2 diabetes mellitus with diabetic neuropathy, unspecified: Secondary | ICD-10-CM | POA: Diagnosis not present

## 2017-10-20 ENCOUNTER — Other Ambulatory Visit: Payer: Self-pay | Admitting: Physician Assistant

## 2017-11-03 ENCOUNTER — Encounter: Payer: Self-pay | Admitting: Cardiovascular Disease

## 2017-11-03 ENCOUNTER — Ambulatory Visit: Payer: Medicare Other | Admitting: Cardiovascular Disease

## 2017-11-03 VITALS — BP 138/80 | HR 76 | Wt 308.0 lb

## 2017-11-03 DIAGNOSIS — I440 Atrioventricular block, first degree: Secondary | ICD-10-CM

## 2017-11-03 DIAGNOSIS — Z955 Presence of coronary angioplasty implant and graft: Secondary | ICD-10-CM | POA: Diagnosis not present

## 2017-11-03 DIAGNOSIS — E7800 Pure hypercholesterolemia, unspecified: Secondary | ICD-10-CM

## 2017-11-03 DIAGNOSIS — I10 Essential (primary) hypertension: Secondary | ICD-10-CM | POA: Diagnosis not present

## 2017-11-03 DIAGNOSIS — N183 Chronic kidney disease, stage 3 unspecified: Secondary | ICD-10-CM

## 2017-11-03 DIAGNOSIS — I25118 Atherosclerotic heart disease of native coronary artery with other forms of angina pectoris: Secondary | ICD-10-CM | POA: Diagnosis not present

## 2017-11-03 DIAGNOSIS — I5032 Chronic diastolic (congestive) heart failure: Secondary | ICD-10-CM | POA: Diagnosis not present

## 2017-11-03 NOTE — Progress Notes (Signed)
SUBJECTIVE: [name] presents for routine follow up. He has a history of hypertension and hypertensive heart disease as well as coronary artery disease and sustained a NSTEMI on 11/16/2012 and received a drug-eluting stent to the LAD and D1, with residual diffuse nonobstructive disease in the RCA and LCx.  He also has CKD, sleep apnea, obesity, and chronic diastolic heart failure.  Most recent echocardiogram ion 07/06/15 demonstrated normal left ventricular systolic function, EF 12-19%, and grade 2 diastolic dysfunction.  He denies chest pain, palpitations, shortness of breath.  He exercises at the [facility] 4 days/week doing cardio on machines as well as benching 75 pounds 10 times and does a total of 4 sets.  He denies lightheadedness, dizziness, and syncope.  He says "I know I drink too much soda and I have to stop ".  He also says he likes to drink water.  ECG performed in the office today which I personally interpreted demonstrated sinus rhythm with first-degree AV block, PR interval 280 ms, probable LVH with repolarization abnormalities, with T wave inversions in the inferolateral leads. The inferior TWI's are new compared to a year ago.      Review of Systems: As per "subjective", otherwise negative.  Allergies  Allergen Reactions  . Niacin And Related Other (See Comments)    Reaction: flushing and burning side effects even with extended release    Current Outpatient Medications  Medication Sig Dispense Refill  . acetaminophen (TYLENOL) 325 MG tablet Take 1-2 tablets (325-650 mg total) by mouth every 6 (six) hours as needed.    Marland Kitchen allopurinol (ZYLOPRIM) 300 MG tablet Take 300 mg by mouth every morning.     Marland Kitchen ALPRAZolam (XANAX) 0.5 MG tablet Take one tablet by mouth twice daily as needed for anxiety (Patient taking differently: Take 0.5 mg by mouth 3 (three) times daily. ) 60 tablet 5  . amLODipine (NORVASC) 10 MG tablet TAKE ONE (1) TABLET BY MOUTH EVERY DAY 90 tablet 3    . aspirin EC 81 MG tablet Take 81 mg by mouth daily.    . calcitRIOL (ROCALTROL) 0.25 MCG capsule Take 1 capsule by mouth daily.    . cholecalciferol (VITAMIN D) 1000 units tablet Take 1,000 Units daily by mouth.    . ferrous sulfate 325 (65 FE) MG tablet Take 1 tablet (325 mg total) by mouth 3 (three) times daily after meals. (Patient taking differently: Take 325 mg by mouth daily with breakfast. )  3  . gabapentin (NEURONTIN) 300 MG capsule Take 300 mg by mouth 2 (two) times daily.     Marland Kitchen HYDROcodone-acetaminophen (NORCO/VICODIN) 5-325 MG tablet Take 1-2 tablets by mouth every 4 (four) hours as needed. 15 tablet 0  . nitroGLYCERIN (NITROSTAT) 0.4 MG SL tablet PLACE ONE TABLET UNDER THE TONGUE EVERY 5 MINUTES FOR 3 DOSES AS NEEDED 25 tablet 3  . omeprazole (PRILOSEC) 20 MG capsule Take 20 mg by mouth every morning.     . pravastatin (PRAVACHOL) 20 MG tablet Take 20 mg by mouth every morning.     . torsemide (DEMADEX) 20 MG tablet TAKE TWO TABLETS BY MOUTH TWICE DAILY 360 tablet 1  . acarbose (PRECOSE) 50 MG tablet     . saxagliptin HCl (ONGLYZA) 2.5 MG TABS tablet Take 2.5 mg by mouth every morning.     No current facility-administered medications for this visit.     Past Medical History:  Diagnosis Date  . Anxiety   . Benign prostatic hypertrophy  Nocturia  . CAD (coronary artery disease)    a.  NSTEMI 10/2012 s/p DES to LAD & DES to 1st diagonal with residual diffuse nonobstructive dz in LCx/RCA   . Chronic kidney disease, stage 3, mod decreased GFR (HCC)    Creatinine of 1.5 in 03/2009  . Degenerative joint disease   . Depression    hx of  . Diabetes mellitus, type II ([place]) 12/10/2012.  . Diastolic heart failure    Pedal edema, "a long time ago"  . Erectile dysfunction   . Flu 2013   hx of  . GERD (gastroesophageal reflux disease)   . Hemorrhoids   . High cholesterol   . Hypertension    Exercise induced  . NSVT (nonsustained ventricular tachycardia) (HCC)    Exercise  induced  . Obesity   . 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 Tubular adenoma 2014    Past Surgical History:  Procedure Laterality Date  . BACK SURGERY  2007   x2  . CARDIAC CATHETERIZATION     with stent placement  . HERNIA REPAIR      Social History   Socioeconomic History  . Marital status: Widowed    Spouse name: Not on file  . Number of children: Not on file  . Years of education: Not on file  . Highest education level: Not on file  Social Needs  . Financial resource strain: Not on file  . Food insecurity - worry: Not on file  . Food insecurity - inability: Not on file  . Transportation needs - medical: Not on file  . Transportation needs - non-medical: Not on file  Occupational History  . Occupation: Disabled    Employer: RETIRED  Tobacco Use  . Smoking status: Former Smoker    Packs/day: 1.00    Years: 30.00    Pack years: 30.00    Types: Cigarettes    Start date: 01/22/1965    Last attempt to quit: 12/23/1992    Years since quitting: 24.8  . Smokeless tobacco: Never Used  Substance and Sexual Activity  . Alcohol use: No    Alcohol/week: 0.0 oz  . Drug use: No  . Sexual activity: Not Currently  Other Topics Concern  . Not on file  Social History Narrative   Married     Vitals:   11/03/17 1403  BP: 138/80  Pulse: 76  SpO2: 96%  Weight: (!) 308 lb (139.7 kg)    Wt Readings from Last 3 Encounters:  11/03/17 (!) 308 lb (139.7 kg)  02/17/17 (!) 304 lb (137.9 kg)  12/14/16 275 lb (124.7 kg)     PHYSICAL EXAM General: NAD HEENT: Normal. Neck: No JVD, no thyromegaly. Lungs: Clear to auscultation bilaterally with normal respiratory effort. CV: Regular rate and rhythm, normal S1/S2, no S3/S4, no murmur.  Trivial bilateral lower extremity edema.  No carotid bruit.   Abdomen: Soft, nontender, no distention.  Neurologic: Alert and oriented.  Psych: Normal affect. Skin: Normal. Musculoskeletal: No gross deformities.    ECG:  Most recent ECG reviewed.   Labs: Lab Results  Component Value Date/Time   K 4.3 12/14/2016 09:41 AM   BUN 48 (H) 12/14/2016 09:41 AM   CREATININE 2.06 (H) 12/14/2016 09:41 AM   CREATININE 2.49 (H) 09/18/2015 01:59 PM   ALT 27 12/14/2016 09:41 AM   TSH 1.725 07/05/2015 03:59 PM   TSH 0.407 01/07/2013 03:45 PM   HGB 13.0 12/14/2016 09:41 AM  Lipids: Lab Results  Component Value Date/Time   LDLCALC 74 11/15/2012 06:25 AM   CHOL 121 11/15/2012 06:25 AM   TRIG 57 11/15/2012 06:25 AM   HDL 36 (L) 11/15/2012 06:25 AM       ASSESSMENT AND PLAN:  1. Chronic diastolic heart failure:  Euvolemic and stable.  On torsemide 40 mg bid. No changes.  2. CAD s/p PCI of LAD and D1: Stable ischemic heart disease.  He continues to exercise 4 times per week without symptoms.  I do not feel stress testing is indicated at this time.  Continue aspirin 81 mg and pravastatin 20 mg daily. Metoprolol previously stopped due to bradycardia.  3. Essential HTN:  Mildly elevated.  No changes to therapy today.  4. Hyperlipidemia: Continue pravastatin 20 mg.   5. First degree AV block and bradycardia: Resolved with discontinuation of metoprolol.     Disposition: Follow up 6 months   Kate Sable, M.D., F.A.C.C.

## 2017-11-03 NOTE — Patient Instructions (Signed)
Your physician wants you to follow-up in:  6 months with the PA-C You will receive a reminder letter in the mail two months in advance. If you don't receive a letter, please call our office to schedule the follow-up appointment.    Your physician recommends that you continue on your current medications as directed. Please refer to the Current Medication list given to you today.     If you need a refill on your cardiac medications before your next appointment, please call your pharmacy.    No lab work or tests ordered today.        Thank you for choosing [facility] !

## 2017-11-11 DIAGNOSIS — E1129 Type 2 diabetes mellitus with other diabetic kidney complication: Secondary | ICD-10-CM | POA: Diagnosis not present

## 2017-11-11 DIAGNOSIS — I10 Essential (primary) hypertension: Secondary | ICD-10-CM | POA: Diagnosis not present

## 2017-11-11 DIAGNOSIS — G894 Chronic pain syndrome: Secondary | ICD-10-CM | POA: Diagnosis not present

## 2017-11-21 DIAGNOSIS — M79672 Pain in left foot: Secondary | ICD-10-CM | POA: Diagnosis not present

## 2017-11-21 DIAGNOSIS — M19072 Primary osteoarthritis, left ankle and foot: Secondary | ICD-10-CM | POA: Diagnosis not present

## 2017-12-26 ENCOUNTER — Ambulatory Visit: Payer: Self-pay | Admitting: Urology

## 2018-01-19 DIAGNOSIS — E1140 Type 2 diabetes mellitus with diabetic neuropathy, unspecified: Secondary | ICD-10-CM | POA: Diagnosis not present

## 2018-01-19 DIAGNOSIS — E1151 Type 2 diabetes mellitus with diabetic peripheral angiopathy without gangrene: Secondary | ICD-10-CM | POA: Diagnosis not present

## 2018-01-21 DIAGNOSIS — M5136 Other intervertebral disc degeneration, lumbar region: Secondary | ICD-10-CM | POA: Diagnosis not present

## 2018-01-21 DIAGNOSIS — S39012A Strain of muscle, fascia and tendon of lower back, initial encounter: Secondary | ICD-10-CM | POA: Diagnosis not present

## 2018-02-09 DIAGNOSIS — G894 Chronic pain syndrome: Secondary | ICD-10-CM | POA: Diagnosis not present

## 2018-02-09 DIAGNOSIS — E1140 Type 2 diabetes mellitus with diabetic neuropathy, unspecified: Secondary | ICD-10-CM | POA: Diagnosis not present

## 2018-02-09 DIAGNOSIS — L89892 Pressure ulcer of other site, stage 2: Secondary | ICD-10-CM | POA: Diagnosis not present

## 2018-02-09 DIAGNOSIS — M159 Polyosteoarthritis, unspecified: Secondary | ICD-10-CM | POA: Diagnosis not present

## 2018-03-20 DIAGNOSIS — R600 Localized edema: Secondary | ICD-10-CM | POA: Diagnosis not present

## 2018-03-20 DIAGNOSIS — N183 Chronic kidney disease, stage 3 (moderate): Secondary | ICD-10-CM | POA: Diagnosis not present

## 2018-03-20 DIAGNOSIS — S39012A Strain of muscle, fascia and tendon of lower back, initial encounter: Secondary | ICD-10-CM | POA: Diagnosis not present

## 2018-04-20 DIAGNOSIS — E1140 Type 2 diabetes mellitus with diabetic neuropathy, unspecified: Secondary | ICD-10-CM | POA: Diagnosis not present

## 2018-04-20 DIAGNOSIS — E1151 Type 2 diabetes mellitus with diabetic peripheral angiopathy without gangrene: Secondary | ICD-10-CM | POA: Diagnosis not present

## 2018-05-12 DIAGNOSIS — E1129 Type 2 diabetes mellitus with other diabetic kidney complication: Secondary | ICD-10-CM | POA: Diagnosis not present

## 2018-05-12 DIAGNOSIS — M1991 Primary osteoarthritis, unspecified site: Secondary | ICD-10-CM | POA: Diagnosis not present

## 2018-05-12 DIAGNOSIS — Z1389 Encounter for screening for other disorder: Secondary | ICD-10-CM | POA: Diagnosis not present

## 2018-05-12 DIAGNOSIS — G894 Chronic pain syndrome: Secondary | ICD-10-CM | POA: Diagnosis not present

## 2018-05-22 DIAGNOSIS — E782 Mixed hyperlipidemia: Secondary | ICD-10-CM | POA: Diagnosis not present

## 2018-05-22 DIAGNOSIS — N184 Chronic kidney disease, stage 4 (severe): Secondary | ICD-10-CM | POA: Diagnosis not present

## 2018-05-22 DIAGNOSIS — I10 Essential (primary) hypertension: Secondary | ICD-10-CM | POA: Diagnosis not present

## 2018-05-22 DIAGNOSIS — E1129 Type 2 diabetes mellitus with other diabetic kidney complication: Secondary | ICD-10-CM | POA: Diagnosis not present

## 2018-06-05 DIAGNOSIS — R600 Localized edema: Secondary | ICD-10-CM | POA: Diagnosis not present

## 2018-06-05 DIAGNOSIS — J329 Chronic sinusitis, unspecified: Secondary | ICD-10-CM | POA: Diagnosis not present

## 2018-06-05 DIAGNOSIS — I10 Essential (primary) hypertension: Secondary | ICD-10-CM | POA: Diagnosis not present

## 2018-06-10 DIAGNOSIS — I10 Essential (primary) hypertension: Secondary | ICD-10-CM | POA: Diagnosis not present

## 2018-06-10 DIAGNOSIS — Z1389 Encounter for screening for other disorder: Secondary | ICD-10-CM | POA: Diagnosis not present

## 2018-06-10 DIAGNOSIS — R600 Localized edema: Secondary | ICD-10-CM | POA: Diagnosis not present

## 2018-06-10 DIAGNOSIS — G894 Chronic pain syndrome: Secondary | ICD-10-CM | POA: Diagnosis not present

## 2018-06-10 DIAGNOSIS — E1129 Type 2 diabetes mellitus with other diabetic kidney complication: Secondary | ICD-10-CM | POA: Diagnosis not present

## 2018-06-10 DIAGNOSIS — Z0001 Encounter for general adult medical examination with abnormal findings: Secondary | ICD-10-CM | POA: Diagnosis not present

## 2018-07-20 ENCOUNTER — Encounter: Payer: Self-pay | Admitting: *Deleted

## 2018-07-21 ENCOUNTER — Ambulatory Visit: Payer: Medicare Other | Admitting: Cardiovascular Disease

## 2018-07-21 ENCOUNTER — Encounter: Payer: Self-pay | Admitting: Cardiovascular Disease

## 2018-07-21 VITALS — BP 142/72 | HR 72 | Ht 73.0 in | Wt 309.0 lb

## 2018-07-21 DIAGNOSIS — I25118 Atherosclerotic heart disease of native coronary artery with other forms of angina pectoris: Secondary | ICD-10-CM | POA: Diagnosis not present

## 2018-07-21 DIAGNOSIS — I440 Atrioventricular block, first degree: Secondary | ICD-10-CM

## 2018-07-21 DIAGNOSIS — I5032 Chronic diastolic (congestive) heart failure: Secondary | ICD-10-CM

## 2018-07-21 DIAGNOSIS — E785 Hyperlipidemia, unspecified: Secondary | ICD-10-CM

## 2018-07-21 DIAGNOSIS — Z955 Presence of coronary angioplasty implant and graft: Secondary | ICD-10-CM | POA: Diagnosis not present

## 2018-07-21 DIAGNOSIS — I10 Essential (primary) hypertension: Secondary | ICD-10-CM

## 2018-07-21 NOTE — Patient Instructions (Addendum)
Your physician wants you to follow-up in:6 months  with Dr.Koneswaran You will receive a reminder letter in the mail two months in advance. If you don't receive a letter, please call our office to schedule the follow-up appointment.      Your physician recommends that you continue on your current medications as directed. Please refer to the Current Medication list given to you today.     If you need a refill on your cardiac medications before your next appointment, please call your pharmacy.      No labs or tests today       Thank you for choosing [facility] !

## 2018-07-21 NOTE — Progress Notes (Signed)
SUBJECTIVE: Mr. Koeppen presents for routine follow up. He has a history of hypertension and hypertensive heart disease as well as coronary artery disease and sustained a NSTEMI on 11/16/2012 and received a drug-eluting stent to the LAD and D1, with residual diffuse nonobstructive disease in the RCA and LCx.  He also has CKD, sleep apnea, obesity, and chronic diastolic heart failure.  Most recent echocardiogram ion 07/06/15 demonstrated normal left ventricular systolic function, EF 78-46%, and grade 2 diastolic dysfunction.  He is doing well today.  He continues to exercise at least 4 days/week and uses the elliptical and also bench presses.  He denies orthopnea paroxysmal nocturnal dyspnea.  He also denies chest pain and palpitations.  His chronic back pain and has undergone surgeries for this in the past.       Review of Systems: As per "subjective", otherwise negative.  Allergies  Allergen Reactions  . Niacin And Related Other (See Comments)    Reaction: flushing and burning side effects even with extended release    Current Outpatient Medications  Medication Sig Dispense Refill  . acarbose (PRECOSE) 50 MG tablet     . acetaminophen (TYLENOL) 325 MG tablet Take 1-2 tablets (325-650 mg total) by mouth every 6 (six) hours as needed.    Marland Kitchen allopurinol (ZYLOPRIM) 300 MG tablet Take 300 mg by mouth every morning.     Marland Kitchen ALPRAZolam (XANAX) 0.5 MG tablet Take one tablet by mouth twice daily as needed for anxiety (Patient taking differently: Take 0.5 mg by mouth 3 (three) times daily. ) 60 tablet 5  . amLODipine (NORVASC) 10 MG tablet TAKE ONE (1) TABLET BY MOUTH EVERY DAY 90 tablet 3  . aspirin EC 81 MG tablet Take 81 mg by mouth daily.    . calcitRIOL (ROCALTROL) 0.25 MCG capsule Take 1 capsule by mouth daily.    . cholecalciferol (VITAMIN D) 1000 units tablet Take 1,000 Units daily by mouth.    . ferrous sulfate 325 (65 FE) MG tablet Take 1 tablet (325 mg total) by mouth 3  (three) times daily after meals. (Patient taking differently: Take 325 mg by mouth daily with breakfast. )  3  . gabapentin (NEURONTIN) 300 MG capsule Take 300 mg by mouth 2 (two) times daily.     Marland Kitchen HYDROcodone-acetaminophen (NORCO/VICODIN) 5-325 MG tablet Take 1-2 tablets by mouth every 4 (four) hours as needed. 15 tablet 0  . nitroGLYCERIN (NITROSTAT) 0.4 MG SL tablet PLACE ONE TABLET UNDER THE TONGUE EVERY 5 MINUTES FOR 3 DOSES AS NEEDED 25 tablet 3  . omeprazole (PRILOSEC) 20 MG capsule Take 20 mg by mouth every morning.     . pravastatin (PRAVACHOL) 20 MG tablet Take 20 mg by mouth every morning.     . saxagliptin HCl (ONGLYZA) 2.5 MG TABS tablet Take 2.5 mg by mouth every morning.    . torsemide (DEMADEX) 20 MG tablet TAKE TWO TABLETS BY MOUTH TWICE DAILY 360 tablet 1   No current facility-administered medications for this visit.     Past Medical History:  Diagnosis Date  . Anxiety   . Benign prostatic hypertrophy    Nocturia  . CAD (coronary artery disease)    a.  NSTEMI 10/2012 s/p DES to LAD & DES to 1st diagonal with residual diffuse nonobstructive dz in LCx/RCA   . Chronic kidney disease, stage 3, mod decreased GFR (HCC)    Creatinine of 1.5 in 03/2009  . Degenerative joint disease   . Depression  hx of  . Diabetes mellitus, type II ([place]) 12/10/2012.  . Diastolic heart failure    Pedal edema, "a long time ago"  . Erectile dysfunction   . Flu 2013   hx of  . GERD (gastroesophageal reflux disease)   . Hemorrhoids   . High cholesterol   . Hypertension    Exercise induced  . NSVT (nonsustained ventricular tachycardia) (HCC)    Exercise induced  . Obesity   . 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 Tubular adenoma 2014    Past Surgical History:  Procedure Laterality Date  . BACK SURGERY  2007   x2  . CARDIAC CATHETERIZATION     with stent placement  . COLONOSCOPY WITH ESOPHAGOGASTRODUODENOSCOPY (EGD) N/A 11/23/2013   Dr. Oneida Alar- TCS= left  sided colitis and mild proctitis, moderate internal hemorrhoids, small nodule in the rectum, bx=tubular adenoma, EGD=-mild non-erosive gastritis  . HERNIA REPAIR    . LEFT HEART CATHETERIZATION WITH CORONARY ANGIOGRAM N/A 11/16/2012   Procedure: LEFT HEART CATHETERIZATION WITH CORONARY ANGIOGRAM;  Surgeon: Sherren Mocha, MD;  Location[facility] C[facility] LAB;  Service: Cardiovascular;  Laterality: N/A;  . PERCUTANEOUS CORONARY STENT INTERVENTION (PCI-S)  11/16/2012   Procedure: PERCUTANEOUS CORONARY STENT INTERVENTION (PCI-S);  Surgeon: Sherren Mocha, MD;  Location: [facility] CATH LAB;  Service: Cardiovascular;;  . TOTAL KNEE ARTHROPLASTY Right 11/15/2013   Procedure: RIGHT TOTAL KNEE ARTHROPLASTY;  Surgeon: Mauri Pole, MD;  Location: [facility] ORS;  Service: Orthopedics;  Laterality: Right;  . TOTAL KNEE ARTHROPLASTY Left 03/07/2015   Procedure: LEFT TOTAL KNEE ARTHROPLASTY;  Surgeon: Paralee Cancel, MD;  Location: [facility] ORS;  Service: Orthopedics;  Laterality: Left;    Social History   Socioeconomic History  . Marital status: Widowed    Spouse name: Not on file  . Number of children: Not on file  . Years of education: Not on file  . Highest education level: Not on file  Occupational History  . Occupation: Disabled    Employer: RETIRED  Social Needs  . Financial resource strain: Not on file  . Food insecurity:    Worry: Not on file    Inability: Not on file  . Transportation needs:    Medical: Not on file    Non-medical: Not on file  Tobacco Use  . Smoking status: Former Smoker    Packs/day: 1.00    Years: 30.00    Pack years: 30.00    Types: Cigarettes    Start date: 01/22/1965    Last attempt to quit: 12/23/1992    Years since quitting: 25.5  . Smokeless tobacco: Never Used  Substance and Sexual Activity  . Alcohol use: No    Alcohol/week: 0.0 oz  . Drug use: No  . Sexual activity: Not Currently  Lifestyle  . Physical activity:    Days per week: Not on file    Minutes per session: Not on file   . Stress: Not on file  Relationships  . Social connections:    Talks on phone: Not on file    Gets together: Not on file    Attends religious service: Not on file    Active member of club or organization: Not on file    Attends meetings of clubs or organizations: Not on file    Relationship status: Not on file  . Intimate partner violence:    Fear of current or ex partner: Not on file    Emotionally abused: Not on file  Physically abused: Not on file    Forced sexual activity: Not on file  Other Topics Concern  . Not on file  Social History Narrative   Married     There were no vitals filed for this visit.  Wt Readings from Last 3 Encounters:  11/03/17 (!) 308 lb (139.7 kg)  02/17/17 (!) 304 lb (137.9 kg)  12/14/16 275 lb (124.7 kg)     PHYSICAL EXAM General: NAD HEENT: Normal. Neck: No JVD, no thyromegaly. Lungs: Clear to auscultation bilaterally with normal respiratory effort. CV: Regular rate and rhythm, normal S1/S2, no S3/S4, no murmur. No pretibial or periankle edema.  No carotid bruit.   Abdomen: Soft, nontender, no distention.  Neurologic: Alert and oriented.  Psych: Normal affect. Skin: Normal. Musculoskeletal: No gross deformities.    ECG: Reviewed above under Subjective   Labs: Lab Results  Component Value Date/Time   K 4.3 12/14/2016 09:41 AM   BUN 48 (H) 12/14/2016 09:41 AM   CREATININE 2.06 (H) 12/14/2016 09:41 AM   CREATININE 2.49 (H) 09/18/2015 01:59 PM   ALT 27 12/14/2016 09:41 AM   TSH 1.725 07/05/2015 03:59 PM   TSH 0.407 01/07/2013 03:45 PM   HGB 13.0 12/14/2016 09:41 AM     Lipids: Lab Results  Component Value Date/Time   LDLCALC 74 11/15/2012 06:25 AM   CHOL 121 11/15/2012 06:25 AM   TRIG 57 11/15/2012 06:25 AM   HDL 36 (L) 11/15/2012 06:25 AM       ASSESSMENT AND PLAN:  1. Chronic diastolic heart failure: On torsemide 40 mg bid. No changes.  2. CAD s/p PCI of LAD and D1: Stable ischemic heart disease. Continue  aspirin 81 mg and pravastatin 20 mg daily. Metoprolol previously stopped due to bradycardia.  3. Essential HTN: Mildly elevated. No changes.  4. Hyperlipidemia: Continue pravastatin 20 mg.   5. First degree AV block and bradycardia: Resolved with discontinuation of metoprolol.     Disposition: Follow up 6 months   Kate Sable, M.D., F.A.C.C.

## 2018-08-06 DIAGNOSIS — J329 Chronic sinusitis, unspecified: Secondary | ICD-10-CM | POA: Diagnosis not present

## 2018-08-06 DIAGNOSIS — G894 Chronic pain syndrome: Secondary | ICD-10-CM | POA: Diagnosis not present

## 2018-08-06 DIAGNOSIS — E1129 Type 2 diabetes mellitus with other diabetic kidney complication: Secondary | ICD-10-CM | POA: Diagnosis not present

## 2018-09-04 DIAGNOSIS — Z23 Encounter for immunization: Secondary | ICD-10-CM | POA: Diagnosis not present

## 2018-09-23 ENCOUNTER — Other Ambulatory Visit: Payer: Self-pay | Admitting: Cardiovascular Disease

## 2018-09-23 DIAGNOSIS — M545 Low back pain: Secondary | ICD-10-CM | POA: Diagnosis not present

## 2018-10-19 DIAGNOSIS — T148XXA Other injury of unspecified body region, initial encounter: Secondary | ICD-10-CM | POA: Diagnosis not present

## 2018-10-19 DIAGNOSIS — T07XXXA Unspecified multiple injuries, initial encounter: Secondary | ICD-10-CM | POA: Diagnosis not present

## 2018-10-23 DIAGNOSIS — S91101A Unspecified open wound of right great toe without damage to nail, initial encounter: Secondary | ICD-10-CM | POA: Diagnosis not present

## 2018-10-23 DIAGNOSIS — R600 Localized edema: Secondary | ICD-10-CM | POA: Diagnosis not present

## 2018-10-29 DIAGNOSIS — S81801D Unspecified open wound, right lower leg, subsequent encounter: Secondary | ICD-10-CM | POA: Diagnosis not present

## 2018-10-29 DIAGNOSIS — S93401A Sprain of unspecified ligament of right ankle, initial encounter: Secondary | ICD-10-CM | POA: Diagnosis not present

## 2018-10-29 DIAGNOSIS — S91101D Unspecified open wound of right great toe without damage to nail, subsequent encounter: Secondary | ICD-10-CM | POA: Diagnosis not present

## 2018-11-03 ENCOUNTER — Other Ambulatory Visit (HOSPITAL_COMMUNITY): Payer: Self-pay | Admitting: Physician Assistant

## 2018-11-03 ENCOUNTER — Ambulatory Visit (HOSPITAL_COMMUNITY)
Admission: RE | Admit: 2018-11-03 | Discharge: 2018-11-03 | Disposition: A | Discharge status: Home / Self Care | Payer: Medicare Other | Source: Ambulatory Visit | Attending: Physician Assistant | Admitting: Physician Assistant

## 2018-11-03 DIAGNOSIS — M19071 Primary osteoarthritis, right ankle and foot: Secondary | ICD-10-CM | POA: Diagnosis not present

## 2018-11-03 DIAGNOSIS — M25571 Pain in right ankle and joints of right foot: Secondary | ICD-10-CM

## 2018-11-03 DIAGNOSIS — M79671 Pain in right foot: Secondary | ICD-10-CM | POA: Diagnosis not present

## 2018-11-03 DIAGNOSIS — M7989 Other specified soft tissue disorders: Secondary | ICD-10-CM | POA: Insufficient documentation

## 2018-11-24 DIAGNOSIS — E1140 Type 2 diabetes mellitus with diabetic neuropathy, unspecified: Secondary | ICD-10-CM | POA: Diagnosis not present

## 2018-11-24 DIAGNOSIS — I10 Essential (primary) hypertension: Secondary | ICD-10-CM | POA: Diagnosis not present

## 2018-11-24 DIAGNOSIS — E1129 Type 2 diabetes mellitus with other diabetic kidney complication: Secondary | ICD-10-CM | POA: Diagnosis not present

## 2018-11-24 DIAGNOSIS — H8113 Benign paroxysmal vertigo, bilateral: Secondary | ICD-10-CM | POA: Diagnosis not present

## 2018-11-24 DIAGNOSIS — M1991 Primary osteoarthritis, unspecified site: Secondary | ICD-10-CM | POA: Diagnosis not present

## 2018-11-27 ENCOUNTER — Other Ambulatory Visit (HOSPITAL_COMMUNITY): Payer: Self-pay | Admitting: Internal Medicine

## 2018-11-27 ENCOUNTER — Ambulatory Visit (HOSPITAL_COMMUNITY)
Admission: RE | Admit: 2018-11-27 | Discharge: 2018-11-27 | Disposition: A | Discharge status: Home / Self Care | Payer: Medicare Other | Source: Ambulatory Visit | Attending: Internal Medicine | Admitting: Internal Medicine

## 2018-11-27 DIAGNOSIS — R51 Headache: Secondary | ICD-10-CM | POA: Diagnosis not present

## 2018-11-27 DIAGNOSIS — I10 Essential (primary) hypertension: Secondary | ICD-10-CM | POA: Diagnosis not present

## 2018-11-27 DIAGNOSIS — R42 Dizziness and giddiness: Secondary | ICD-10-CM | POA: Diagnosis not present

## 2018-12-04 DIAGNOSIS — R42 Dizziness and giddiness: Secondary | ICD-10-CM | POA: Diagnosis not present

## 2018-12-12 ENCOUNTER — Other Ambulatory Visit: Payer: Self-pay

## 2018-12-12 ENCOUNTER — Encounter (HOSPITAL_COMMUNITY): Payer: Self-pay | Admitting: Emergency Medicine

## 2018-12-12 ENCOUNTER — Emergency Department (HOSPITAL_COMMUNITY)
Admission: EM | Admit: 2018-12-12 | Discharge: 2018-12-12 | Disposition: A | Discharge status: Home / Self Care | Payer: Medicare Other | Attending: Emergency Medicine | Admitting: Emergency Medicine

## 2018-12-12 DIAGNOSIS — Z79899 Other long term (current) drug therapy: Secondary | ICD-10-CM | POA: Insufficient documentation

## 2018-12-12 DIAGNOSIS — E119 Type 2 diabetes mellitus without complications: Secondary | ICD-10-CM | POA: Insufficient documentation

## 2018-12-12 DIAGNOSIS — R42 Dizziness and giddiness: Secondary | ICD-10-CM | POA: Diagnosis not present

## 2018-12-12 DIAGNOSIS — I130 Hypertensive heart and chronic kidney disease with heart failure and stage 1 through stage 4 chronic kidney disease, or unspecified chronic kidney disease: Secondary | ICD-10-CM | POA: Diagnosis not present

## 2018-12-12 DIAGNOSIS — N183 Chronic kidney disease, stage 3 (moderate): Secondary | ICD-10-CM | POA: Insufficient documentation

## 2018-12-12 DIAGNOSIS — R001 Bradycardia, unspecified: Secondary | ICD-10-CM | POA: Diagnosis not present

## 2018-12-12 DIAGNOSIS — I5032 Chronic diastolic (congestive) heart failure: Secondary | ICD-10-CM | POA: Insufficient documentation

## 2018-12-12 DIAGNOSIS — I2510 Atherosclerotic heart disease of native coronary artery without angina pectoris: Secondary | ICD-10-CM | POA: Insufficient documentation

## 2018-12-12 DIAGNOSIS — Z87891 Personal history of nicotine dependence: Secondary | ICD-10-CM | POA: Diagnosis not present

## 2018-12-12 LAB — CBC WITH DIFFERENTIAL/PLATELET
Abs Immature Granulocytes: 0.05 10*3/uL (ref 0.00–0.07)
BASOS ABS: 0 10*3/uL (ref 0.0–0.1)
Basophils Relative: 0 %
EOS PCT: 1 %
Eosinophils Absolute: 0.1 10*3/uL (ref 0.0–0.5)
HCT: 42.5 % (ref 39.0–52.0)
HEMOGLOBIN: 13.2 g/dL (ref 13.0–17.0)
IMMATURE GRANULOCYTES: 1 %
LYMPHS ABS: 1.5 10*3/uL (ref 0.7–4.0)
LYMPHS PCT: 15 %
MCH: 32.1 pg (ref 26.0–34.0)
MCHC: 31.1 g/dL (ref 30.0–36.0)
MCV: 103.4 fL — ABNORMAL HIGH (ref 80.0–100.0)
Monocytes Absolute: 0.9 10*3/uL (ref 0.1–1.0)
Monocytes Relative: 9 %
NEUTROS PCT: 74 %
NRBC: 0 % (ref 0.0–0.2)
Neutro Abs: 7.3 10*3/uL (ref 1.7–7.7)
Platelets: 195 10*3/uL (ref 150–400)
RBC: 4.11 MIL/uL — AB (ref 4.22–5.81)
RDW: 14.1 % (ref 11.5–15.5)
WBC: 9.8 10*3/uL (ref 4.0–10.5)

## 2018-12-12 LAB — COMPREHENSIVE METABOLIC PANEL
ALBUMIN: 4.3 g/dL (ref 3.5–5.0)
ALK PHOS: 86 U/L (ref 38–126)
ALT: 52 U/L — ABNORMAL HIGH (ref 0–44)
ANION GAP: 8 (ref 5–15)
AST: 25 U/L (ref 15–41)
BUN: 46 mg/dL — AB (ref 8–23)
CO2: 24 mmol/L (ref 22–32)
Calcium: 9.3 mg/dL (ref 8.9–10.3)
Chloride: 108 mmol/L (ref 98–111)
Creatinine, Ser: 2.19 mg/dL — ABNORMAL HIGH (ref 0.61–1.24)
GFR calc Af Amer: 33 mL/min — ABNORMAL LOW (ref >60)
GFR calc non Af Amer: 29 mL/min — ABNORMAL LOW (ref >60)
GLUCOSE: 149 mg/dL — AB (ref 70–99)
POTASSIUM: 4.2 mmol/L (ref 3.5–5.1)
SODIUM: 140 mmol/L (ref 135–145)
Total Bilirubin: 0.4 mg/dL (ref 0.3–1.2)
Total Protein: 8.5 g/dL — ABNORMAL HIGH (ref 6.5–8.1)

## 2018-12-12 MED ORDER — ONDANSETRON 4 MG PO TBDP: ORAL_TABLET | ORAL | 0 refills | Status: DC

## 2018-12-12 MED ORDER — PREDNISONE 20 MG PO TABS: ORAL_TABLET | ORAL | 0 refills | Status: DC

## 2018-12-12 MED ORDER — METHYLPREDNISOLONE SODIUM SUCC 125 MG IJ SOLR
125.0000 mg | Freq: Once | INTRAMUSCULAR | Status: AC
  Administered 2018-12-12: 125 mg via INTRAVENOUS
  Filled 2018-12-12: qty 2

## 2018-12-12 MED ORDER — ACETAMINOPHEN 325 MG PO TABS
650.0000 mg | ORAL_TABLET | Freq: Once | ORAL | Status: AC
Start: 2018-12-12 — End: 2018-12-12
  Administered 2018-12-12: 650 mg via ORAL
  Filled 2018-12-12: qty 2

## 2018-12-12 MED ORDER — LORAZEPAM 2 MG/ML IJ SOLN
1.0000 mg | Freq: Once | INTRAMUSCULAR | Status: AC
  Administered 2018-12-12: 1 mg via INTRAVENOUS
  Filled 2018-12-12: qty 1

## 2018-12-12 MED ORDER — SODIUM CHLORIDE 0.9 % IV BOLUS: 1000.0000 mL | Freq: Once | INTRAVENOUS | Status: DC

## 2018-12-12 MED ORDER — ONDANSETRON HCL 4 MG/2ML IJ SOLN
4.0000 mg | Freq: Once | INTRAMUSCULAR | Status: AC
  Administered 2018-12-12: 4 mg via INTRAVENOUS
  Filled 2018-12-12: qty 2

## 2018-12-12 MED ORDER — LORAZEPAM 1 MG PO TABS: 1.0000 mg | ORAL_TABLET | Freq: Three times a day (TID) | ORAL | 0 refills | Status: DC | PRN

## 2018-12-12 MED ORDER — MECLIZINE HCL 12.5 MG PO TABS
25.0000 mg | ORAL_TABLET | Freq: Once | ORAL | Status: AC
  Administered 2018-12-12: 25 mg via ORAL
  Filled 2018-12-12: qty 2

## 2018-12-12 NOTE — Discharge Instructions (Addendum)
Continue taking the meclizine if you are dizzy.  Follow-up with your family doctor Monday or Tuesday for recheck

## 2018-12-12 NOTE — ED Triage Notes (Signed)
Pt C/O dizziness that started 3 weeks ago. Pt received meclizine from his PCP last Friday. Pt states the medication helped "for a little bit." Pt he is having trouble ambulating.

## 2018-12-12 NOTE — ED Provider Notes (Signed)
[facility] EMERGENCY DEPARTMENT Provider Note   CSN: [id number] Arrival date & time: 12/12/18  2000     History   Chief Complaint Chief Complaint  Patient presents with  . Dizziness    HPI [name] is a 72 y.o. male.  Patient complains of dizziness.  He has a history of vertigo.  The history is provided by the patient. No language interpreter was used.  Dizziness  Quality:  Head spinning Severity:  Moderate Timing:  Constant Progression:  Unable to specify Chronicity:  Recurrent Context: bending over   Associated symptoms: no chest pain, no diarrhea and no headaches     Past Medical History:  Diagnosis Date  . Anxiety   . Benign prostatic hypertrophy    Nocturia  . CAD (coronary artery disease)    a.  NSTEMI 10/2012 s/p DES to LAD & DES to 1st diagonal with residual diffuse nonobstructive dz in LCx/RCA   . Chronic kidney disease, stage 3, mod decreased GFR (HCC)    Creatinine of 1.5 in 03/2009  . Degenerative joint disease   . Depression    hx of  . Diabetes mellitus, type II ([place]) 12/10/2012.  . Diastolic heart failure    Pedal edema, "a long time ago"  . Erectile dysfunction   . Flu 2013   hx of  . GERD (gastroesophageal reflux disease)   . Hemorrhoids   . High cholesterol   . Hypertension    Exercise induced  . NSVT (nonsustained ventricular tachycardia) (HCC)    Exercise induced  . Obesity   . 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 Tubular adenoma 2014    Patient Active Problem List   Diagnosis Date Noted  . Peripheral edema   . CHF (congestive heart failure) ([place]) 07/05/2015  . Acute congestive heart failure ([place]) 07/04/2015  . Acute on chronic diastolic congestive heart failure ([place]) 07/04/2015  . HLD (hyperlipidemia) 07/04/2015  . Anxiety state 07/04/2015  . GERD (gastroesophageal reflux disease) 07/04/2015  . Heart block 07/04/2015  . Edema 03/11/2015  . Diastolic CHF, chronic ([place]) 03/11/2015  . S/P left  TKA 03/07/2015  . Muscle weakness (generalized) 01/13/2014  . Knee stiffness 01/13/2014  . Gout 12/11/2013  . Cellulitis 11/19/2013  . Expected blood loss anemia 11/17/2013  . Obese 11/17/2013  . S/P right TKA 11/15/2013  . Anemia, normocytic normochromic 01/12/2013  . Diabetes mellitus type 2 in obese ([place]) 12/10/2012  . CAD S/P percutaneous coronary angioplasty 11/24/2012  . Overweight(278.02) 11/17/2012  . Obstructive sleep apnea 11/15/2012  . Chronic kidney disease, stage 3, cardiorenal syndrome 08/23/2011  . Essential hypertension     Past Surgical History:  Procedure Laterality Date  . BACK SURGERY  2007   x2  . CARDIAC CATHETERIZATION     with stent placement  . COLONOSCOPY WITH ESOPHAGOGASTRODUODENOSCOPY (EGD) N/A 11/23/2013   Dr. Oneida Alar- TCS= left sided colitis and mild proctitis, moderate internal hemorrhoids, small nodule in the rectum, bx=tubular adenoma, EGD=-mild non-erosive gastritis  . HERNIA REPAIR    . LEFT HEART CATHETERIZATION WITH CORONARY ANGIOGRAM N/A 11/16/2012   Procedure: LEFT HEART CATHETERIZATION WITH CORONARY ANGIOGRAM;  Surgeon: Sherren Mocha, MD;  Location: [facility] CATH LAB;  Service: Cardiovascular;  Laterality: N/A;  . PERCUTANEOUS CORONARY STENT INTERVENTION (PCI-S)  11/16/2012   Procedure: PERCUTANEOUS CORONARY STENT INTERVENTION (PCI-S);  Surgeon: Sherren Mocha, MD;  Location: [facility] CATH LAB;  Service: Cardiovascular;;  . TOTAL KNEE ARTHROPLASTY Right 11/15/2013   Procedure:  RIGHT TOTAL KNEE ARTHROPLASTY;  Surgeon: Mauri Pole, MD;  Location: [facility] ORS;  Service: Orthopedics;  Laterality: Right;  . TOTAL KNEE ARTHROPLASTY Left 03/07/2015   Procedure: LEFT TOTAL KNEE ARTHROPLASTY;  Surgeon: Paralee Cancel, MD;  Location: [facility] ORS;  Service: Orthopedics;  Laterality: Left;        Home Medications    Prior to Admission medications   Medication Sig Start Date End Date Taking? Authorizing Provider  acetaminophen (TYLENOL) 325 MG tablet Take 1-2 tablets  (325-650 mg total) by mouth every 6 (six) hours as needed. Patient taking differently: Take 1,300 mg by mouth every 6 (six) hours as needed.  11/17/13  Yes Babish, Rodman Key, PA-C  ALPRAZolam Duanne Moron) 0.5 MG tablet Take one tablet by mouth twice daily as needed for anxiety Patient taking differently: Take 0.25-0.5 mg by mouth 3 (three) times daily as needed for anxiety.  03/09/15  Yes Danae Orleans, PA-C  aspirin EC 81 MG tablet Take 81 mg by mouth every morning.    Yes [provider]  cholecalciferol (VITAMIN D) 1000 units tablet Take 1,000 Units daily by mouth.   Yes [provider]  ferrous sulfate 325 (65 FE) MG tablet Take 1 tablet (325 mg total) by mouth 3 (three) times daily after meals. Patient taking differently: Take 325 mg by mouth daily with breakfast.  03/09/15  Yes Babish, Rodman Key, PA-C  gabapentin (NEURONTIN) 300 MG capsule Take 300 mg by mouth 3 (three) times daily.    Yes [provider]  HYDROcodone-acetaminophen (NORCO/VICODIN) 5-325 MG tablet Take 1-2 tablets by mouth every 4 (four) hours as needed. Patient taking differently: Take 1-2 tablets by mouth every 4 (four) hours as needed for moderate pain.  12/14/16  Yes [name], MD  nitroGLYCERIN (NITROSTAT) 0.4 MG SL tablet PLACE ONE TABLET UNDER THE TONGUE EVERY 5 MINUTES FOR 3 DOSES AS NEEDED Patient taking differently: Place 0.4 mg under the tongue every 5 (five) minutes as needed.  07/22/17  Yes Herminio Commons, MD  torsemide (DEMADEX) 20 MG tablet TAKE TWO TABLETS BY MOUTH TWICE DAILY Patient taking differently: Take 40 mg by mouth 2 (two) times daily.  09/23/18  Yes Herminio Commons, MD  acarbose (PRECOSE) 50 MG tablet  10/20/17   [provider]  allopurinol (ZYLOPRIM) 300 MG tablet Take 300 mg by mouth every morning.     [provider]  amLODipine (NORVASC) 10 MG tablet TAKE ONE TABLET BY MOUTH EVERY DAY. 09/23/18   Herminio Commons, MD  calcitRIOL (ROCALTROL) 0.25  MCG capsule Take 1 capsule by mouth daily. 12/13/16   [provider]  LORazepam (ATIVAN) 1 MG tablet Take 1 tablet (1 mg total) by mouth every 8 (eight) hours as needed (dizziness). 12/12/18   Milton Ferguson, MD  ondansetron (ZOFRAN ODT) 4 MG disintegrating tablet 46m ODT q4 hours prn nausea/vomit 12/12/18   ZMilton Ferguson MD  pravastatin (PRAVACHOL) 20 MG tablet Take 20 mg by mouth every morning.     [provider]  predniSONE (DELTASONE) 20 MG tablet 2 tabs po daily x 3 days 12/12/18   ZMilton Ferguson MD  saxagliptin HCl (ONGLYZA) 2.5 MG TABS tablet Take 2.5 mg by mouth every morning.    [provider]    Family History Family History  Problem Relation Age of Onset  . Hypertension Other   . Cancer Other   . Heart disease Other   . Diabetes Other   . Hypertension Mother   . Cancer Father   .  CAD Brother   . Colon cancer Neg Hx     Social History Social History   Tobacco Use  . Smoking status: Former Smoker    Packs/day: 1.00    Years: 30.00    Pack years: 30.00    Types: Cigarettes    Start date: 01/22/1965    Last attempt to quit: 12/23/1992    Years since quitting: 25.9  . Smokeless tobacco: Never Used  Substance Use Topics  . Alcohol use: No    Alcohol/week: 0.0 standard drinks  . Drug use: No     Allergies   Niacin and related   Review of Systems Review of Systems  Constitutional: Negative for appetite change and fatigue.  HENT: Negative for congestion, ear discharge and sinus pressure.   Eyes: Negative for discharge.  Respiratory: Negative for cough.   Cardiovascular: Negative for chest pain.  Gastrointestinal: Negative for abdominal pain and diarrhea.  Genitourinary: Negative for frequency and hematuria.  Musculoskeletal: Negative for back pain.  Skin: Negative for rash.  Neurological: Positive for dizziness. Negative for seizures and headaches.  Psychiatric/Behavioral: Negative for hallucinations.     Physical  Exam Updated Vital Signs BP (!) 156/64 (BP Location: Left Arm)   Pulse 86   Temp 98.2 F (36.8 C) (Oral)   Resp 18   Ht 6' 2"  (1.88 m)   Wt 127 kg   SpO2 100%   BMI 35.95 kg/m   Physical Exam Constitutional:      Appearance: He is well-developed.  HENT:     Head: Normocephalic.     Nose: Nose normal.  Eyes:     General: No scleral icterus.    Conjunctiva/sclera: Conjunctivae normal.     Comments: No nystagmus  Neck:     Musculoskeletal: Neck supple.     Thyroid: No thyromegaly.  Cardiovascular:     Rate and Rhythm: Normal rate and regular rhythm.     Heart sounds: No murmur. No friction rub. No gallop.   Pulmonary:     Breath sounds: No stridor. No wheezing or rales.  Chest:     Chest wall: No tenderness.  Abdominal:     General: There is no distension.     Tenderness: There is no abdominal tenderness. There is no rebound.  Musculoskeletal: Normal range of motion.  Lymphadenopathy:     Cervical: No cervical adenopathy.  Skin:    Findings: No erythema or rash.  Neurological:     Mental Status: He is alert and oriented to person, place, and time.     Motor: No abnormal muscle tone.     Coordination: Coordination normal.  Psychiatric:        Behavior: Behavior normal.      ED Treatments / Results  Labs (all labs ordered are listed, but only abnormal results are displayed) Labs Reviewed  CBC WITH DIFFERENTIAL/PLATELET - Abnormal; Notable for the following components:      Result Value   RBC 4.11 (*)    MCV 103.4 (*)    All other components within normal limits  COMPREHENSIVE METABOLIC PANEL - Abnormal; Notable for the following components:   Glucose, Bld 149 (*)    BUN 46 (*)    Creatinine, Ser 2.19 (*)    Total Protein 8.5 (*)    ALT 52 (*)    GFR calc non Af Amer 29 (*)    GFR calc Af Amer 33 (*)    All other components within normal limits    EKG None  Radiology No results found.  Procedures Procedures (including critical care  time)  Medications Ordered in ED Medications  sodium chloride 0.9 % bolus 1,000 mL (1,000 mLs Intravenous Refused 12/12/18 2137)  acetaminophen (TYLENOL) tablet 650 mg (has no administration in time range)  ondansetron (ZOFRAN) injection 4 mg (4 mg Intravenous Given 12/12/18 2115)  LORazepam (ATIVAN) injection 1 mg (1 mg Intravenous Given 12/12/18 2115)  methylPREDNISolone sodium succinate (SOLU-MEDROL) 125 mg/2 mL injection 125 mg (125 mg Intravenous Given 12/12/18 2115)  meclizine (ANTIVERT) tablet 25 mg (25 mg Oral Given 12/12/18 2116)     Initial Impression / Assessment and Plan / ED Course  I have reviewed the triage vital signs and the nursing notes.  Pertinent labs & imaging results that were available during my care of the patient were reviewed by me and considered in my medical decision making (see chart for details).    Labs unremarkable except for kidney disease.  This is stable.  Patient improved with meclizine and Ativan and steroids for his vertigo.  He will be discharged home and will follow-up with his PCP  Final Clinical Impressions(s) / ED Diagnoses   Final diagnoses:  Dizziness  Vertigo    ED Discharge Orders         Ordered    predniSONE (DELTASONE) 20 MG tablet     12/12/18 2302    ondansetron (ZOFRAN ODT) 4 MG disintegrating tablet     12/12/18 2302    LORazepam (ATIVAN) 1 MG tablet  Every 8 hours PRN     12/12/18 2302           Milton Ferguson, MD 12/12/18 2305

## 2018-12-17 DIAGNOSIS — E1151 Type 2 diabetes mellitus with diabetic peripheral angiopathy without gangrene: Secondary | ICD-10-CM | POA: Diagnosis not present

## 2018-12-17 DIAGNOSIS — I5030 Unspecified diastolic (congestive) heart failure: Secondary | ICD-10-CM | POA: Diagnosis not present

## 2018-12-17 DIAGNOSIS — G894 Chronic pain syndrome: Secondary | ICD-10-CM | POA: Diagnosis not present

## 2018-12-17 DIAGNOSIS — I10 Essential (primary) hypertension: Secondary | ICD-10-CM | POA: Diagnosis not present

## 2018-12-17 DIAGNOSIS — H8112 Benign paroxysmal vertigo, left ear: Secondary | ICD-10-CM | POA: Diagnosis not present

## 2018-12-31 DIAGNOSIS — H6523 Chronic serous otitis media, bilateral: Secondary | ICD-10-CM | POA: Diagnosis not present

## 2018-12-31 DIAGNOSIS — I10 Essential (primary) hypertension: Secondary | ICD-10-CM | POA: Diagnosis not present

## 2018-12-31 DIAGNOSIS — M1991 Primary osteoarthritis, unspecified site: Secondary | ICD-10-CM | POA: Diagnosis not present

## 2018-12-31 DIAGNOSIS — E1129 Type 2 diabetes mellitus with other diabetic kidney complication: Secondary | ICD-10-CM | POA: Diagnosis not present

## 2019-01-08 DIAGNOSIS — I10 Essential (primary) hypertension: Secondary | ICD-10-CM | POA: Diagnosis not present

## 2019-01-08 DIAGNOSIS — M159 Polyosteoarthritis, unspecified: Secondary | ICD-10-CM | POA: Diagnosis not present

## 2019-01-08 DIAGNOSIS — Z1389 Encounter for screening for other disorder: Secondary | ICD-10-CM | POA: Diagnosis not present

## 2019-01-08 DIAGNOSIS — R600 Localized edema: Secondary | ICD-10-CM | POA: Diagnosis not present

## 2019-01-08 DIAGNOSIS — E1140 Type 2 diabetes mellitus with diabetic neuropathy, unspecified: Secondary | ICD-10-CM | POA: Diagnosis not present

## 2019-01-08 DIAGNOSIS — Z0001 Encounter for general adult medical examination with abnormal findings: Secondary | ICD-10-CM | POA: Diagnosis not present

## 2019-01-08 DIAGNOSIS — M109 Gout, unspecified: Secondary | ICD-10-CM | POA: Diagnosis not present

## 2019-01-08 DIAGNOSIS — M1991 Primary osteoarthritis, unspecified site: Secondary | ICD-10-CM | POA: Diagnosis not present

## 2019-01-08 DIAGNOSIS — N184 Chronic kidney disease, stage 4 (severe): Secondary | ICD-10-CM | POA: Diagnosis not present

## 2019-01-18 ENCOUNTER — Ambulatory Visit (INDEPENDENT_AMBULATORY_CARE_PROVIDER_SITE_OTHER): Payer: Medicare Other | Admitting: Otolaryngology

## 2019-01-18 DIAGNOSIS — H903 Sensorineural hearing loss, bilateral: Secondary | ICD-10-CM

## 2019-01-18 DIAGNOSIS — R42 Dizziness and giddiness: Secondary | ICD-10-CM

## 2019-01-18 DIAGNOSIS — H9313 Tinnitus, bilateral: Secondary | ICD-10-CM | POA: Diagnosis not present

## 2019-01-20 DIAGNOSIS — H903 Sensorineural hearing loss, bilateral: Secondary | ICD-10-CM | POA: Diagnosis not present

## 2019-01-20 DIAGNOSIS — R42 Dizziness and giddiness: Secondary | ICD-10-CM | POA: Diagnosis not present

## 2019-02-08 DIAGNOSIS — M25572 Pain in left ankle and joints of left foot: Secondary | ICD-10-CM | POA: Diagnosis not present

## 2019-02-08 DIAGNOSIS — M19079 Primary osteoarthritis, unspecified ankle and foot: Secondary | ICD-10-CM | POA: Insufficient documentation

## 2019-02-08 DIAGNOSIS — M19072 Primary osteoarthritis, left ankle and foot: Secondary | ICD-10-CM | POA: Diagnosis not present

## 2019-02-09 DIAGNOSIS — H9313 Tinnitus, bilateral: Secondary | ICD-10-CM | POA: Diagnosis not present

## 2019-02-09 DIAGNOSIS — H8110 Benign paroxysmal vertigo, unspecified ear: Secondary | ICD-10-CM | POA: Diagnosis not present

## 2019-02-19 ENCOUNTER — Other Ambulatory Visit: Payer: Self-pay

## 2019-02-19 ENCOUNTER — Emergency Department (HOSPITAL_COMMUNITY): Payer: Medicare Other

## 2019-02-19 ENCOUNTER — Encounter (HOSPITAL_COMMUNITY): Payer: Self-pay | Admitting: Emergency Medicine

## 2019-02-19 ENCOUNTER — Emergency Department (HOSPITAL_COMMUNITY)
Admission: EM | Admit: 2019-02-19 | Discharge: 2019-02-19 | Disposition: A | Discharge status: Home / Self Care | Payer: Medicare Other | Attending: Emergency Medicine | Admitting: Emergency Medicine

## 2019-02-19 DIAGNOSIS — I5032 Chronic diastolic (congestive) heart failure: Secondary | ICD-10-CM | POA: Diagnosis not present

## 2019-02-19 DIAGNOSIS — N183 Chronic kidney disease, stage 3 (moderate): Secondary | ICD-10-CM | POA: Diagnosis not present

## 2019-02-19 DIAGNOSIS — I130 Hypertensive heart and chronic kidney disease with heart failure and stage 1 through stage 4 chronic kidney disease, or unspecified chronic kidney disease: Secondary | ICD-10-CM | POA: Insufficient documentation

## 2019-02-19 DIAGNOSIS — E1122 Type 2 diabetes mellitus with diabetic chronic kidney disease: Secondary | ICD-10-CM | POA: Insufficient documentation

## 2019-02-19 DIAGNOSIS — Z79899 Other long term (current) drug therapy: Secondary | ICD-10-CM | POA: Insufficient documentation

## 2019-02-19 DIAGNOSIS — Z7982 Long term (current) use of aspirin: Secondary | ICD-10-CM | POA: Diagnosis not present

## 2019-02-19 DIAGNOSIS — R600 Localized edema: Secondary | ICD-10-CM | POA: Diagnosis not present

## 2019-02-19 DIAGNOSIS — R42 Dizziness and giddiness: Secondary | ICD-10-CM | POA: Insufficient documentation

## 2019-02-19 DIAGNOSIS — Z87891 Personal history of nicotine dependence: Secondary | ICD-10-CM | POA: Diagnosis not present

## 2019-02-19 LAB — CBC WITH DIFFERENTIAL/PLATELET
Abs Immature Granulocytes: 0.09 10*3/uL — ABNORMAL HIGH (ref 0.00–0.07)
Basophils Absolute: 0.1 10*3/uL (ref 0.0–0.1)
Basophils Relative: 1 %
Eosinophils Absolute: 0.4 10*3/uL (ref 0.0–0.5)
Eosinophils Relative: 5 %
HCT: 44.1 % (ref 39.0–52.0)
Hemoglobin: 14.5 g/dL (ref 13.0–17.0)
Immature Granulocytes: 1 %
Lymphocytes Relative: 15 %
Lymphs Abs: 1.1 10*3/uL (ref 0.7–4.0)
MCH: 32.9 pg (ref 26.0–34.0)
MCHC: 32.9 g/dL (ref 30.0–36.0)
MCV: 100 fL (ref 80.0–100.0)
Monocytes Absolute: 0.9 10*3/uL (ref 0.1–1.0)
Monocytes Relative: 12 %
NEUTROS PCT: 66 %
Neutro Abs: 4.7 10*3/uL (ref 1.7–7.7)
Platelets: 196 10*3/uL (ref 150–400)
RBC: 4.41 MIL/uL (ref 4.22–5.81)
RDW: 12.5 % (ref 11.5–15.5)
WBC: 7.2 10*3/uL (ref 4.0–10.5)
nRBC: 0 % (ref 0.0–0.2)

## 2019-02-19 LAB — COMPREHENSIVE METABOLIC PANEL
ALT: 29 U/L (ref 0–44)
AST: 24 U/L (ref 15–41)
Albumin: 4 g/dL (ref 3.5–5.0)
Alkaline Phosphatase: 75 U/L (ref 38–126)
Anion gap: 13 (ref 5–15)
BUN: 53 mg/dL — AB (ref 8–23)
CO2: 24 mmol/L (ref 22–32)
Calcium: 9.4 mg/dL (ref 8.9–10.3)
Chloride: 102 mmol/L (ref 98–111)
Creatinine, Ser: 2.32 mg/dL — ABNORMAL HIGH (ref 0.61–1.24)
GFR calc Af Amer: 31 mL/min — ABNORMAL LOW (ref >60)
GFR, EST NON AFRICAN AMERICAN: 27 mL/min — AB (ref >60)
Glucose, Bld: 222 mg/dL — ABNORMAL HIGH (ref 70–99)
Potassium: 4.3 mmol/L (ref 3.5–5.1)
Sodium: 139 mmol/L (ref 135–145)
Total Bilirubin: 0.3 mg/dL (ref 0.3–1.2)
Total Protein: 7.9 g/dL (ref 6.5–8.1)

## 2019-02-19 LAB — BRAIN NATRIURETIC PEPTIDE: B Natriuretic Peptide: 15 pg/mL (ref 0.0–100.0)

## 2019-02-19 MED ORDER — LORAZEPAM 0.5 MG PO TABS: 0.5000 mg | ORAL_TABLET | Freq: Four times a day (QID) | ORAL | 0 refills | Status: DC | PRN

## 2019-02-19 MED ORDER — LORAZEPAM 0.5 MG PO TABS
0.5000 mg | ORAL_TABLET | Freq: Once | ORAL | Status: AC
  Administered 2019-02-19: 0.5 mg via ORAL
  Filled 2019-02-19: qty 1

## 2019-02-19 NOTE — ED Triage Notes (Signed)
Patient states he was sent by Dr. [name] for his legs "turning dark." Patient also c/o vertigo lasting 6 weeks.

## 2019-02-19 NOTE — ED Notes (Signed)
Pt reports being dizziness for about 10 weeks.  Sent here by PCP.  Also c/o slight swelling In BLE.  Pt says both legs are dark in color.
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
## 2019-02-19 NOTE — Discharge Instructions (Addendum)
We sent a prescription to your pharmacy to treat your dizziness.  This medicine can make you sleepy.  Do not drive within 6 hours of taking the lorazepam.  You need to get a ride home from the emergency department tonight.  Follow-up with your ENT doctor as soon as possible to arrange for physical therapy to treat your dizziness.  The discoloring of your lower legs is related to poor blood flow.  It will help to elevate your legs above your heart and use some heat on them 3 or 4 times a day.  You also may need to wear some compression stockings.  Your primary care doctor can help you with all of these issues.

## 2019-02-19 NOTE — ED Triage Notes (Signed)
Patient states symptoms in his legs started about 3 weeks ago.

## 2019-02-19 NOTE — ED Notes (Signed)
Pt refused final VS,

## 2019-03-03 DIAGNOSIS — R42 Dizziness and giddiness: Secondary | ICD-10-CM | POA: Diagnosis not present

## 2019-03-03 DIAGNOSIS — R2689 Other abnormalities of gait and mobility: Secondary | ICD-10-CM | POA: Diagnosis not present

## 2019-03-08 DIAGNOSIS — E1140 Type 2 diabetes mellitus with diabetic neuropathy, unspecified: Secondary | ICD-10-CM | POA: Diagnosis not present

## 2019-03-08 DIAGNOSIS — G894 Chronic pain syndrome: Secondary | ICD-10-CM | POA: Diagnosis not present

## 2019-03-08 DIAGNOSIS — M5136 Other intervertebral disc degeneration, lumbar region: Secondary | ICD-10-CM | POA: Diagnosis not present

## 2019-03-11 DIAGNOSIS — R42 Dizziness and giddiness: Secondary | ICD-10-CM | POA: Diagnosis not present

## 2019-03-11 DIAGNOSIS — E1140 Type 2 diabetes mellitus with diabetic neuropathy, unspecified: Secondary | ICD-10-CM | POA: Diagnosis not present

## 2019-03-11 DIAGNOSIS — G894 Chronic pain syndrome: Secondary | ICD-10-CM | POA: Diagnosis not present

## 2019-03-11 DIAGNOSIS — L03119 Cellulitis of unspecified part of limb: Secondary | ICD-10-CM | POA: Diagnosis not present

## 2019-03-29 DIAGNOSIS — M75101 Unspecified rotator cuff tear or rupture of right shoulder, not specified as traumatic: Secondary | ICD-10-CM | POA: Diagnosis not present

## 2019-03-29 DIAGNOSIS — I5032 Chronic diastolic (congestive) heart failure: Secondary | ICD-10-CM | POA: Diagnosis not present

## 2019-03-29 DIAGNOSIS — M5412 Radiculopathy, cervical region: Secondary | ICD-10-CM | POA: Diagnosis not present

## 2019-03-29 DIAGNOSIS — M47812 Spondylosis without myelopathy or radiculopathy, cervical region: Secondary | ICD-10-CM | POA: Diagnosis not present

## 2019-04-01 ENCOUNTER — Emergency Department (HOSPITAL_COMMUNITY): Payer: Medicare Other

## 2019-04-01 ENCOUNTER — Other Ambulatory Visit: Payer: Self-pay

## 2019-04-01 ENCOUNTER — Inpatient Hospital Stay (HOSPITAL_COMMUNITY)
Admission: EM | Admit: 2019-04-01 | Discharge: 2019-04-06 | DRG: 872 | Disposition: A | Discharge status: Skilled Nursing Facility | Payer: Medicare Other | Attending: Internal Medicine | Admitting: Internal Medicine

## 2019-04-01 ENCOUNTER — Encounter (HOSPITAL_COMMUNITY): Payer: Self-pay

## 2019-04-01 DIAGNOSIS — Z6837 Body mass index (BMI) 37.0-37.9, adult: Secondary | ICD-10-CM

## 2019-04-01 DIAGNOSIS — I5030 Unspecified diastolic (congestive) heart failure: Secondary | ICD-10-CM | POA: Diagnosis not present

## 2019-04-01 DIAGNOSIS — I252 Old myocardial infarction: Secondary | ICD-10-CM

## 2019-04-01 DIAGNOSIS — Z79891 Long term (current) use of opiate analgesic: Secondary | ICD-10-CM

## 2019-04-01 DIAGNOSIS — G4733 Obstructive sleep apnea (adult) (pediatric): Secondary | ICD-10-CM | POA: Diagnosis present

## 2019-04-01 DIAGNOSIS — N401 Enlarged prostate with lower urinary tract symptoms: Secondary | ICD-10-CM | POA: Diagnosis present

## 2019-04-01 DIAGNOSIS — I130 Hypertensive heart and chronic kidney disease with heart failure and stage 1 through stage 4 chronic kidney disease, or unspecified chronic kidney disease: Secondary | ICD-10-CM | POA: Diagnosis not present

## 2019-04-01 DIAGNOSIS — N183 Chronic kidney disease, stage 3 unspecified: Secondary | ICD-10-CM | POA: Diagnosis present

## 2019-04-01 DIAGNOSIS — E1165 Type 2 diabetes mellitus with hyperglycemia: Secondary | ICD-10-CM | POA: Diagnosis not present

## 2019-04-01 DIAGNOSIS — E669 Obesity, unspecified: Secondary | ICD-10-CM | POA: Diagnosis present

## 2019-04-01 DIAGNOSIS — R351 Nocturia: Secondary | ICD-10-CM | POA: Diagnosis present

## 2019-04-01 DIAGNOSIS — E1169 Type 2 diabetes mellitus with other specified complication: Secondary | ICD-10-CM | POA: Diagnosis present

## 2019-04-01 DIAGNOSIS — Z87891 Personal history of nicotine dependence: Secondary | ICD-10-CM | POA: Diagnosis not present

## 2019-04-01 DIAGNOSIS — Z8249 Family history of ischemic heart disease and other diseases of the circulatory system: Secondary | ICD-10-CM

## 2019-04-01 DIAGNOSIS — M79641 Pain in right hand: Secondary | ICD-10-CM | POA: Diagnosis not present

## 2019-04-01 DIAGNOSIS — I2510 Atherosclerotic heart disease of native coronary artery without angina pectoris: Secondary | ICD-10-CM | POA: Diagnosis present

## 2019-04-01 DIAGNOSIS — D72829 Elevated white blood cell count, unspecified: Secondary | ICD-10-CM | POA: Diagnosis not present

## 2019-04-01 DIAGNOSIS — Z7982 Long term (current) use of aspirin: Secondary | ICD-10-CM

## 2019-04-01 DIAGNOSIS — I10 Essential (primary) hypertension: Secondary | ICD-10-CM

## 2019-04-01 DIAGNOSIS — I5032 Chronic diastolic (congestive) heart failure: Secondary | ICD-10-CM | POA: Diagnosis present

## 2019-04-01 DIAGNOSIS — E119 Type 2 diabetes mellitus without complications: Secondary | ICD-10-CM | POA: Diagnosis present

## 2019-04-01 DIAGNOSIS — M542 Cervicalgia: Secondary | ICD-10-CM | POA: Diagnosis not present

## 2019-04-01 DIAGNOSIS — L0390 Cellulitis, unspecified: Secondary | ICD-10-CM | POA: Diagnosis not present

## 2019-04-01 DIAGNOSIS — Z955 Presence of coronary angioplasty implant and graft: Secondary | ICD-10-CM | POA: Diagnosis not present

## 2019-04-01 DIAGNOSIS — Z79899 Other long term (current) drug therapy: Secondary | ICD-10-CM

## 2019-04-01 DIAGNOSIS — Z23 Encounter for immunization: Secondary | ICD-10-CM

## 2019-04-01 DIAGNOSIS — Z7952 Long term (current) use of systemic steroids: Secondary | ICD-10-CM

## 2019-04-01 DIAGNOSIS — M2550 Pain in unspecified joint: Secondary | ICD-10-CM | POA: Diagnosis not present

## 2019-04-01 DIAGNOSIS — L03113 Cellulitis of right upper limb: Secondary | ICD-10-CM | POA: Diagnosis present

## 2019-04-01 DIAGNOSIS — M19012 Primary osteoarthritis, left shoulder: Secondary | ICD-10-CM | POA: Diagnosis present

## 2019-04-01 DIAGNOSIS — Z833 Family history of diabetes mellitus: Secondary | ICD-10-CM

## 2019-04-01 DIAGNOSIS — E1122 Type 2 diabetes mellitus with diabetic chronic kidney disease: Secondary | ICD-10-CM | POA: Diagnosis present

## 2019-04-01 DIAGNOSIS — F411 Generalized anxiety disorder: Secondary | ICD-10-CM | POA: Diagnosis present

## 2019-04-01 DIAGNOSIS — M6281 Muscle weakness (generalized): Secondary | ICD-10-CM | POA: Diagnosis not present

## 2019-04-01 DIAGNOSIS — M7989 Other specified soft tissue disorders: Secondary | ICD-10-CM | POA: Diagnosis not present

## 2019-04-01 DIAGNOSIS — M25519 Pain in unspecified shoulder: Secondary | ICD-10-CM

## 2019-04-01 DIAGNOSIS — L03115 Cellulitis of right lower limb: Secondary | ICD-10-CM | POA: Diagnosis not present

## 2019-04-01 DIAGNOSIS — A419 Sepsis, unspecified organism: Principal | ICD-10-CM | POA: Diagnosis present

## 2019-04-01 DIAGNOSIS — E139 Other specified diabetes mellitus without complications: Secondary | ICD-10-CM | POA: Diagnosis not present

## 2019-04-01 DIAGNOSIS — R600 Localized edema: Secondary | ICD-10-CM | POA: Diagnosis not present

## 2019-04-01 DIAGNOSIS — Z888 Allergy status to other drugs, medicaments and biological substances status: Secondary | ICD-10-CM

## 2019-04-01 LAB — CBC WITH DIFFERENTIAL/PLATELET
Abs Immature Granulocytes: 0.06 10*3/uL (ref 0.00–0.07)
Basophils Absolute: 0.1 10*3/uL (ref 0.0–0.1)
Basophils Relative: 0 %
Eosinophils Absolute: 0 10*3/uL (ref 0.0–0.5)
Eosinophils Relative: 0 %
HCT: 37 % — ABNORMAL LOW (ref 39.0–52.0)
Hemoglobin: 12 g/dL — ABNORMAL LOW (ref 13.0–17.0)
Immature Granulocytes: 1 %
Lymphocytes Relative: 8 %
Lymphs Abs: 1 10*3/uL (ref 0.7–4.0)
MCH: 32.6 pg (ref 26.0–34.0)
MCHC: 32.4 g/dL (ref 30.0–36.0)
MCV: 100.5 fL — ABNORMAL HIGH (ref 80.0–100.0)
Monocytes Absolute: 1.4 10*3/uL — ABNORMAL HIGH (ref 0.1–1.0)
Monocytes Relative: 10 %
Neutro Abs: 10.8 10*3/uL — ABNORMAL HIGH (ref 1.7–7.7)
Neutrophils Relative %: 81 %
Platelets: 222 10*3/uL (ref 150–400)
RBC: 3.68 MIL/uL — ABNORMAL LOW (ref 4.22–5.81)
RDW: 13 % (ref 11.5–15.5)
WBC: 13.3 10*3/uL — ABNORMAL HIGH (ref 4.0–10.5)
nRBC: 0 % (ref 0.0–0.2)

## 2019-04-01 LAB — COMPREHENSIVE METABOLIC PANEL
ALT: 19 U/L (ref 0–44)
AST: 22 U/L (ref 15–41)
Albumin: 3.3 g/dL — ABNORMAL LOW (ref 3.5–5.0)
Alkaline Phosphatase: 57 U/L (ref 38–126)
Anion gap: 15 (ref 5–15)
BUN: 69 mg/dL — ABNORMAL HIGH (ref 8–23)
CO2: 22 mmol/L (ref 22–32)
Calcium: 9.3 mg/dL (ref 8.9–10.3)
Chloride: 102 mmol/L (ref 98–111)
Creatinine, Ser: 2.46 mg/dL — ABNORMAL HIGH (ref 0.61–1.24)
GFR calc Af Amer: 29 mL/min — ABNORMAL LOW (ref >60)
GFR calc non Af Amer: 25 mL/min — ABNORMAL LOW (ref >60)
Glucose, Bld: 319 mg/dL — ABNORMAL HIGH (ref 70–99)
Potassium: 4 mmol/L (ref 3.5–5.1)
Sodium: 139 mmol/L (ref 135–145)
Total Bilirubin: 0.8 mg/dL (ref 0.3–1.2)
Total Protein: 8.4 g/dL — ABNORMAL HIGH (ref 6.5–8.1)

## 2019-04-01 LAB — GLUCOSE, CAPILLARY: Glucose-Capillary: 220 mg/dL — ABNORMAL HIGH (ref 70–99)

## 2019-04-01 LAB — LACTIC ACID, PLASMA: Lactic Acid, Venous: 1.6 mmol/L (ref 0.5–1.9)

## 2019-04-01 MED ORDER — FERROUS SULFATE 325 (65 FE) MG PO TABS
325.0000 mg | ORAL_TABLET | Freq: Every day | ORAL | Status: DC
  Administered 2019-04-02 – 2019-04-06 (×5): 325 mg via ORAL
  Filled 2019-04-01 (×5): qty 1

## 2019-04-01 MED ORDER — ONDANSETRON HCL 4 MG PO TABS: 4.0000 mg | ORAL_TABLET | Freq: Four times a day (QID) | ORAL | Status: DC | PRN

## 2019-04-01 MED ORDER — HYDROCODONE-ACETAMINOPHEN 10-325 MG PO TABS
1.0000 | ORAL_TABLET | Freq: Four times a day (QID) | ORAL | Status: DC | PRN
  Administered 2019-04-01 – 2019-04-06 (×9): 1 via ORAL
  Filled 2019-04-01 (×9): qty 1

## 2019-04-01 MED ORDER — INSULIN ASPART 100 UNIT/ML ~~LOC~~ SOLN
0.0000 [IU] | Freq: Every day | SUBCUTANEOUS | Status: DC
  Administered 2019-04-01: 2 [IU] via SUBCUTANEOUS

## 2019-04-01 MED ORDER — INSULIN ASPART 100 UNIT/ML ~~LOC~~ SOLN: 0.0000 [IU] | Freq: Three times a day (TID) | SUBCUTANEOUS | Status: DC

## 2019-04-01 MED ORDER — ONDANSETRON HCL 4 MG/2ML IJ SOLN: 4.0000 mg | Freq: Four times a day (QID) | INTRAMUSCULAR | Status: DC | PRN

## 2019-04-01 MED ORDER — HEPARIN SODIUM (PORCINE) 5000 UNIT/ML IJ SOLN: 5000.0000 [IU] | Freq: Three times a day (TID) | INTRAMUSCULAR | Status: DC

## 2019-04-01 MED ORDER — PNEUMOCOCCAL VAC POLYVALENT 25 MCG/0.5ML IJ INJ
0.5000 mL | INJECTION | INTRAMUSCULAR | Status: AC
  Administered 2019-04-02: 0.5 mL via INTRAMUSCULAR
  Filled 2019-04-01: qty 0.5

## 2019-04-01 MED ORDER — VITAMIN D 25 MCG (1000 UNIT) PO TABS
1000.0000 [IU] | ORAL_TABLET | Freq: Every day | ORAL | Status: DC
  Administered 2019-04-02 – 2019-04-06 (×5): 1000 [IU] via ORAL
  Filled 2019-04-01 (×5): qty 1

## 2019-04-01 MED ORDER — ASPIRIN EC 81 MG PO TBEC
81.0000 mg | DELAYED_RELEASE_TABLET | Freq: Every morning | ORAL | Status: DC
  Administered 2019-04-02 – 2019-04-06 (×5): 81 mg via ORAL
  Filled 2019-04-01 (×5): qty 1

## 2019-04-01 MED ORDER — CALCITRIOL 0.25 MCG PO CAPS
0.2500 ug | ORAL_CAPSULE | Freq: Every day | ORAL | Status: DC
  Administered 2019-04-02 – 2019-04-06 (×5): 0.25 ug via ORAL
  Filled 2019-04-01 (×6): qty 1

## 2019-04-01 MED ORDER — ACETAMINOPHEN 650 MG RE SUPP: 650.0000 mg | Freq: Four times a day (QID) | RECTAL | Status: DC | PRN

## 2019-04-01 MED ORDER — SODIUM CHLORIDE 0.9 % IV SOLN: 2.0000 g | INTRAVENOUS | Status: DC

## 2019-04-01 MED ORDER — SODIUM CHLORIDE 0.9 % IV SOLN
1.0000 g | Freq: Once | INTRAVENOUS | Status: AC
  Administered 2019-04-01: 1 g via INTRAVENOUS
  Filled 2019-04-01: qty 10

## 2019-04-01 MED ORDER — ACETAMINOPHEN 325 MG PO TABS: 650.0000 mg | ORAL_TABLET | Freq: Four times a day (QID) | ORAL | Status: DC | PRN

## 2019-04-01 MED ORDER — CLINDAMYCIN PHOSPHATE 900 MG/50ML IV SOLN
900.0000 mg | Freq: Three times a day (TID) | INTRAVENOUS | Status: DC
  Administered 2019-04-01 – 2019-04-02 (×2): 900 mg via INTRAVENOUS
  Filled 2019-04-01 (×2): qty 50

## 2019-04-01 NOTE — ED Notes (Signed)
Attempted to call report, was on hold for over 7 minutes with no one to give report to, will call back.

## 2019-04-01 NOTE — ED Provider Notes (Signed)
[facility] EMERGENCY DEPARTMENT Provider Note   CSN: [id number] Arrival date & time: 04/01/19  1351    History   Chief Complaint Chief Complaint  Patient presents with  . Cellulitis    HPI [name] is a 73 y.o. male.     HPI Patient presents with swelling and pain in his right hand.  Swelling began around 3 to 4 days ago.  States he has had chills.  States that initially started with some pain in his neck went down the arm.  Reportedly got seen by his PCP who then sent him in here.  States he has had gout before.  No trauma to the hand.  He is diabetic.  Patient is swollen in the hand and the wrist.  No chest pain.  No dysuria.  No trouble breathing. Past Medical History:  Diagnosis Date  . Anxiety   . Benign prostatic hypertrophy    Nocturia  . CAD (coronary artery disease)    a.  NSTEMI 10/2012 s/p DES to LAD & DES to 1st diagonal with residual diffuse nonobstructive dz in LCx/RCA   . Chronic kidney disease, stage 3, mod decreased GFR (HCC)    Creatinine of 1.5 in 03/2009  . Degenerative joint disease   . Depression    hx of  . Diabetes mellitus, type II ([place]) 12/10/2012.  . Diastolic heart failure    Pedal edema, "a long time ago"  . Erectile dysfunction   . Flu 2013   hx of  . GERD (gastroesophageal reflux disease)   . Hemorrhoids   . High cholesterol   . Hypertension    Exercise induced  . NSVT (nonsustained ventricular tachycardia) (HCC)    Exercise induced  . Obesity   . 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 Tubular adenoma 2014    Patient Active Problem List   Diagnosis Date Noted  . Peripheral edema   . CHF (congestive heart failure) ([place]) 07/05/2015  . Acute congestive heart failure ([place]) 07/04/2015  . Acute on chronic diastolic congestive heart failure ([place]) 07/04/2015  . HLD (hyperlipidemia) 07/04/2015  . Anxiety state 07/04/2015  . GERD (gastroesophageal reflux disease) 07/04/2015  . Heart block 07/04/2015  . Edema  03/11/2015  . Diastolic CHF, chronic ([place]) 03/11/2015  . 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 Muscle weakness (generalized) 01/13/2014  . Knee stiffness 01/13/2014  . Gout 12/11/2013  . Cellulitis 11/19/2013  . Expected blood loss anemia 11/17/2013  . Obese 11/17/2013  . S/P right TKA 11/15/2013  . Anemia, normocytic normochromic 01/12/2013  . Diabetes mellitus type 2 in obese ([place]) 12/10/2012  . CAD S/P percutaneous coronary angioplasty 11/24/2012  . Overweight(278.02) 11/17/2012  . Obstructive sleep apnea 11/15/2012  . Chronic kidney disease, stage 3, cardiorenal syndrome 08/23/2011  . Essential hypertension     Past Surgical History:  Procedure Laterality Date  . BACK SURGERY  2007   x2  . CARDIAC CATHETERIZATION     with stent placement  . COLONOSCOPY WITH ESOPHAGOGASTRODUODENOSCOPY (EGD) N/A 11/23/2013   Dr. Oneida Alar- TCS= left sided colitis and mild proctitis, moderate internal hemorrhoids, small nodule in the rectum, bx=tubular adenoma, EGD=-mild non-erosive gastritis  . HERNIA REPAIR    . LEFT HEART CATHETERIZATION WITH CORONARY ANGIOGRAM N/A 11/16/2012   Procedure: LEFT HEART CATHETERIZATION WITH CORONARY ANGIOGRAM;  Surgeon: Sherren Mocha, MD;  Location: [facility] CATH LAB;  Service: Cardiovascular;  Laterality: N/A;  . PERCUTANEOUS CORONARY STENT INTERVENTION (PCI-S)  11/16/2012  Procedure: PERCUTANEOUS CORONARY STENT INTERVENTION (PCI-S);  Surgeon: Sherren Mocha, MD;  Location: [facility] CATH LAB;  Service: Cardiovascular;;  . TOTAL KNEE ARTHROPLASTY Right 11/15/2013   Procedure: RIGHT TOTAL KNEE ARTHROPLASTY;  Surgeon: Mauri Pole, MD;  Location: [facility] ORS;  Service: Orthopedics;  Laterality: Right;  . TOTAL KNEE ARTHROPLASTY Left 03/07/2015   Procedure: LEFT TOTAL KNEE ARTHROPLASTY;  Surgeon: Paralee Cancel, MD;  Location: [facility] ORS;  Service: Orthopedics;  Laterality: Left;        Home Medications    Prior to Admission medications   Medication Sig Start Date End Date Taking? Authorizing  Provider  acarbose (PRECOSE) 50 MG tablet Take 50 mg by mouth 3 (three) times daily with meals.  10/20/17  Yes [provider]  calcitRIOL (ROCALTROL) 0.25 MCG capsule Take 1 capsule by mouth daily. 12/13/16  Yes [provider]  doxycycline (VIBRA-TABS) 100 MG tablet Take 100 mg by mouth 2 (two) times daily. 10 day course starting on 03/22/2019   Yes [provider]  furosemide (LASIX) 40 MG tablet Take 40 mg by mouth 2 (two) times daily.   Yes [provider]  HYDROcodone-acetaminophen (NORCO) 10-325 MG tablet Take 1-2 tablets by mouth 4 (four) times daily as needed for moderate pain or severe pain.   Yes [provider]  lactulose (CHRONULAC) 10 GM/15ML solution Take 10 g by mouth 3 (three) times daily as needed for mild constipation.   Yes [provider]  LORazepam (ATIVAN) 0.5 MG tablet Take 1 tablet (0.5 mg total) by mouth every 6 (six) hours as needed (Dizziness). 02/19/19  Yes Daleen Bo, MD  meclizine (ANTIVERT) 25 MG tablet Take 25 mg by mouth every 6 (six) hours as needed for dizziness.   Yes [provider]  mupirocin ointment (BACTROBAN) 2 % Apply 1 application topically 2 (two) times daily.   Yes [provider]  nitroGLYCERIN (NITROSTAT) 0.4 MG SL tablet PLACE ONE TABLET UNDER THE TONGUE EVERY 5 MINUTES FOR 3 DOSES AS NEEDED Patient taking differently: Place 0.4 mg under the tongue every 5 (five) minutes as needed.  07/22/17  Yes Herminio Commons, MD  ondansetron (ZOFRAN ODT) 4 MG disintegrating tablet 2m ODT q4 hours prn nausea/vomit Patient taking differently: Take 4 mg by mouth every 6 (six) hours as needed for nausea.  12/12/18  Yes ZMilton Ferguson MD  acetaminophen (TYLENOL) 325 MG tablet Take 1-2 tablets (325-650 mg total) by mouth every 6 (six) hours as needed. Patient taking differently: Take 1,300 mg by mouth every 6 (six) hours as needed.  11/17/13   BDanae Orleans PA-C  ALPRAZolam (Duanne Moron 0.5 MG  tablet Take one tablet by mouth twice daily as needed for anxiety Patient not taking: Reported on 02/19/2019 03/09/15   BDanae Orleans PA-C  amLODipine (NORVASC) 10 MG tablet TAKE ONE TABLET BY MOUTH EVERY DAY. Patient taking differently: Take 10 mg by mouth daily.  09/23/18   KHerminio Commons MD  aspirin EC 81 MG tablet Take 81 mg by mouth every morning.     [provider]  cholecalciferol (VITAMIN D) 1000 units tablet Take 1,000 Units daily by mouth.    [provider]  ferrous sulfate 325 (65 FE) MG tablet Take 1 tablet (325 mg total) by mouth 3 (three) times daily after meals. Patient taking differently: Take 325 mg by mouth daily with breakfast.  03/09/15   BDanae Orleans PA-C  predniSONE (DELTASONE) 20 MG tablet 2 tabs po daily x 3 days Patient not taking:  Reported on 02/19/2019 12/12/18   Milton Ferguson, MD  SSD 1 % cream Apply 1 application topically daily.  02/22/19   [provider]  torsemide (DEMADEX) 20 MG tablet TAKE TWO TABLETS BY MOUTH TWICE DAILY Patient not taking: No sig reported 09/23/18   Herminio Commons, MD    Family History Family History  Problem Relation Age of Onset  . Hypertension Other   . Cancer Other   . Heart disease Other   . Diabetes Other   . Hypertension Mother   . Cancer Father   . CAD Brother   . Colon cancer Neg Hx     Social History Social History   Tobacco Use  . Smoking status: Former Smoker    Packs/day: 1.00    Years: 30.00    Pack years: 30.00    Types: Cigarettes    Start date: 01/22/1965    Last attempt to quit: 12/23/1992    Years since quitting: 26.2  . Smokeless tobacco: Never Used  Substance Use Topics  . Alcohol use: No    Alcohol/week: 0.0 standard drinks  . Drug use: No     Allergies   Niacin and related   Review of Systems Review of Systems  Constitutional: Positive for chills. Negative for appetite change.  HENT: Negative for congestion.   Respiratory: Negative for shortness  of breath.   Cardiovascular: Negative for chest pain.  Gastrointestinal: Negative for abdominal pain.  Genitourinary: Negative for flank pain.  Musculoskeletal:       Right hand pain and swelling.  Some neck pain.  Skin: Positive for wound. Negative for rash.  Neurological: Negative for weakness.  Hematological: Negative for adenopathy.  Psychiatric/Behavioral: Negative for agitation.     Physical Exam Updated Vital Signs BP 135/70   Pulse (!) 106   Temp 99 F (37.2 C) (Oral)   Resp 19   Ht 6' 2"  (1.88 m)   Wt 129.3 kg   SpO2 97%   BMI 36.59 kg/m   Physical Exam Vitals signs and nursing note reviewed.  HENT:     Head: Normocephalic.     Mouth/Throat:     Mouth: Mucous membranes are moist.  Eyes:     Extraocular Movements: Extraocular movements intact.  Neck:     Musculoskeletal: Neck supple.  Cardiovascular:     Comments: Tachycardia Pulmonary:     Effort: Pulmonary effort is normal.  Abdominal:     Tenderness: There is no abdominal tenderness.  Musculoskeletal:        General: Tenderness present.     Comments: Erythema and tenderness over right hand and wrist.  Goes down to fingers.  Pain with flexion extension at the fingers.  Radial pulse intact.  Swelling does go a little proximal to the wrist.  Otherwise arm is nonswollen.  No fluctuance but does have moderate edema.  Skin:    Capillary Refill: Capillary refill takes less than 2 seconds.  Neurological:     General: No focal deficit present.     Mental Status: He is alert.      ED Treatments / Results  Labs (all labs ordered are listed, but only abnormal results are displayed) Labs Reviewed  CBC WITH DIFFERENTIAL/PLATELET - Abnormal; Notable for the following components:      Result Value   WBC 13.3 (*)    RBC 3.68 (*)    Hemoglobin 12.0 (*)    HCT 37.0 (*)    MCV 100.5 (*)    Neutro Abs  10.8 (*)    Monocytes Absolute 1.4 (*)    All other components within normal limits  COMPREHENSIVE METABOLIC  PANEL - Abnormal; Notable for the following components:   Glucose, Bld 319 (*)    BUN 69 (*)    Creatinine, Ser 2.46 (*)    Total Protein 8.4 (*)    Albumin 3.3 (*)    GFR calc non Af Amer 25 (*)    GFR calc Af Amer 29 (*)    All other components within normal limits    EKG None  Radiology Dg Hand Complete Right  Result Date: 04/01/2019 CLINICAL DATA:  Pain and swelling EXAM: RIGHT HAND - COMPLETE 3+ VIEW COMPARISON:  None. FINDINGS: Frontal, oblique, and lateral views obtained. There is marked soft tissue swelling diffusely without radiopaque foreign body or soft tissue air. No acute fracture or dislocation. Suspect previous trauma in the region of the triquetrum bone. There is osteoarthritic change in the radiocarpal joint. There is also osteoarthritic change in the scaphotrapezial joint. There is slight narrowing of all PIP joints. No erosive changes or bony destruction. IMPRESSION: Marked generalized soft tissue swelling without soft tissue air or radiopaque foreign body. No bony destruction or erosion. No acute fracture or dislocation. Suspect old trauma involving the right triquetrum bone. Areas of osteoarthritic change noted. Electronically Signed   By: Lowella Grip III M.D.   On: 04/01/2019 15:26    Procedures Procedures (including critical care time)  Medications Ordered in ED Medications - No data to display   Initial Impression / Assessment and Plan / ED Course  I have reviewed the triage vital signs and the nursing notes.  Pertinent labs & imaging results that were available during my care of the patient were reviewed by me and considered in my medical decision making (see chart for details).        Patient with right hand pain and swelling.  Rather tender.  White count is somewhat elevated patient is diabetic.  Doubt DVT.  Potentially could have gout but with chills and fever I am worried more of a cellulitis.  Think patient benefit with his comorbidities for  admission the hospital for IV antibiotics.  Will discuss with hospitalist.  Final Clinical Impressions(s) / ED Diagnoses   Final diagnoses:  Cellulitis of hand, right    ED Discharge Orders    None       Davonna Belling, MD 04/01/19 1707

## 2019-04-01 NOTE — ED Triage Notes (Signed)
Pt sent by Dr [name] for swelling of right hand. Pt states it started 3-4 days ago. Pt reports having chills also but unsure of fever.

## 2019-04-01 NOTE — H&P (Signed)
History and Physical    PLEASE NOTE THAT DRAGON DICTATION SOFTWARE WAS USED IN THE CONSTRUCTION OF THIS NOTE.   [name] YTK:[id number] DOB: 03-06-1946 DOA: 04/01/2019  PCP: Redmond School, MD Patient coming from: home  I have personally briefly reviewed patient's old medical records in [place]  Chief Complaint: right hand pain  HPI: [name] is a 73 y.o. male with medical history significant for coronary artery disease status post drug-eluting stent placement x2 in 7517, chronic diastolic heart failure, hypertension, type 2 diabetes mellitus, stage III/IV chronic kidney disease with baseline creatinine 2.2-2.4, who is admitted to [facility] on 04[facility]01/2019 with sepsis due to right hand cellulitis after presenting from home to the [facility] ED complaining of right hand pain.  The patient reports 3 to 4 days of right hand discomfort associated with erythema, increased warmth, and swelling about the dorsal aspect of the right hand.  Denies any preceding trauma involving the right hand, and denies any preceding animal bites to the RUE.  He reports 1 day of subjective fever in the absence of chills or rigors.  Denies rash in any other location, and denies extension of the erythema into the fingers of the right hand. Within 1 day of onset of the aforementioned symptoms, the patient reports that he presented to his PCP for evaluation of such.  He reports that he was diagnosed with right hand cellulitis at that time, and prescribed doxycycline.  The patient reports that he felt nauseous after taking his first dose of doxycycline, prompting him to discontinue the rest of the prescribed regimen.  Subsequent to the single dose of doxycycline, the patient reports that he has had no additional antibiotics leading up to presentation to the emergency department today.  He denies any associated pallor, numbness, or paresthesias involving the right hand, and he also denies any right hand  discharge.    ED Course: Vital signs in the emergency department were notable for the following: Temperature maximum 99.6; heart rate ranged from 99-111; blood pressure ranged from 112/53 to 122/65; respiratory rate 16-23, and oxygen saturation 94 to 97% on room air.  Labs performed in the ED were notable for the following: CMP notable for sodium 139, bicarbonate 22, creatinine 2.46 relative to most recent prior creatinine data point of 2.32 on 02/19/2019, glucose 319.  CBC notable for white blood cell count of 13,300 with 81% neutrophils.  Lactic acid 1.6.  Plain films of the right hand, per final radiology report showed evidence of generalized soft tissue swelling without soft tissue air or radiopaque foreign body.  Additionally, plain films of the right hand revealed no evidence of acute bony abnormality.  While in the ED, the patient received Rocephin 1 g IV x1.  Subsequently, the patient was admitted to the med telemetry floor for further evaluation and management of sepsis due to right hand cellulitis.    Review of Systems: As per HPI otherwise 10 point review of systems negative.   Past Medical History:  Diagnosis Date   Anxiety    Benign prostatic hypertrophy    Nocturia   CAD (coronary artery disease)    a.  NSTEMI 10/2012 s/p DES to LAD & DES to 1st diagonal with residual diffuse nonobstructive dz in LCx/RCA    Chronic kidney disease, stage 3, mod decreased GFR (HCC)    Creatinine of 1.5 in 03/2009   Degenerative joint disease    Depression    hx of   Diabetes mellitus,  type II ([place]) 12/10/2012.   Diastolic heart failure    Pedal edema, "a long time ago"   Erectile dysfunction    Flu 2013   hx of   GERD (gastroesophageal reflux disease)    Hemorrhoids    High cholesterol    Hypertension    Exercise induced   NSVT (nonsustained ventricular tachycardia) ([place])    Exercise induced   Obesity    Tobacco abuse, 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, quit 1994    Tubular adenoma 2014    Past Surgical History:  Procedure Laterality Date   BACK SURGERY  2007   x2   CARDIAC CATHETERIZATION     with stent placement   COLONOSCOPY WITH ESOPHAGOGASTRODUODENOSCOPY (EGD) N/A 11/23/2013   Dr. Oneida Alar- TCS= left sided colitis and mild proctitis, moderate internal hemorrhoids, small nodule in the rectum, bx=tubular adenoma, EGD=-mild non-erosive gastritis   HERNIA REPAIR     LEFT HEART CATHETERIZATION WITH CORONARY ANGIOGRAM N/A 11/16/2012   Procedure: LEFT HEART CATHETERIZATION WITH CORONARY ANGIOGRAM;  Surgeon: Sherren Mocha, MD;  Location: [facility] CATH LAB;  Service: Cardiovascular;  Laterality: N/A;   PERCUTANEOUS CORONARY STENT INTERVENTION (PCI-S)  11/16/2012   Procedure: PERCUTANEOUS CORONARY STENT INTERVENTION (PCI-S);  Surgeon: Sherren Mocha, MD;  Location: [facility] CATH LAB;  Service: Cardiovascular;;   TOTAL KNEE ARTHROPLASTY Right 11/15/2013   Procedure: RIGHT TOTAL KNEE ARTHROPLASTY;  Surgeon: Mauri Pole, MD;  Location: [facility] ORS;  Service: Orthopedics;  Laterality: Right;   TOTAL KNEE ARTHROPLASTY Left 03/07/2015   Procedure: LEFT TOTAL KNEE ARTHROPLASTY;  Surgeon: Paralee Cancel, MD;  Location: [facility] ORS;  Service: Orthopedics;  Laterality: Left;    Social History:  reports that he quit smoking about 26 years ago. His smoking use included cigarettes. He started smoking about 54 years ago. He has a 30.00 pack-year smoking history. He has never used smokeless tobacco. He reports that he does not drink alcohol or use drugs.   Allergies  Allergen Reactions   Niacin And Related Other (See Comments)    Reaction: flushing and burning side effects even with extended release    Family History  Problem Relation Age of Onset   Hypertension Other    Cancer Other    Heart disease Other    Diabetes Other    Hypertension Mother    Cancer Father    CAD Brother    Colon cancer Neg Hx      Prior to Admission medications   Medication Sig Start  Date End Date Taking? Authorizing Provider  acarbose (PRECOSE) 50 MG tablet Take 50 mg by mouth 3 (three) times daily with meals.  10/20/17  Yes [provider]  calcitRIOL (ROCALTROL) 0.25 MCG capsule Take 1 capsule by mouth daily. 12/13/16  Yes [provider]  doxycycline (VIBRA-TABS) 100 MG tablet Take 100 mg by mouth 2 (two) times daily. 10 day course starting on 03/22/2019   Yes [provider]  furosemide (LASIX) 40 MG tablet Take 40 mg by mouth 2 (two) times daily.   Yes [provider]  HYDROcodone-acetaminophen (NORCO) 10-325 MG tablet Take 1-2 tablets by mouth 4 (four) times daily as needed for moderate pain or severe pain.   Yes [provider]  lactulose (CHRONULAC) 10 GM/15ML solution Take 10 g by mouth 3 (three) times daily as needed for mild constipation.   Yes [provider]  LORazepam (ATIVAN) 0.5 MG tablet Take 1 tablet (0.5 mg total) by mouth every 6 (six) hours as needed (  Dizziness). 02/19/19  Yes Daleen Bo, MD  meclizine (ANTIVERT) 25 MG tablet Take 25 mg by mouth every 6 (six) hours as needed for dizziness.   Yes [provider]  mupirocin ointment (BACTROBAN) 2 % Apply 1 application topically 2 (two) times daily.   Yes [provider]  nitroGLYCERIN (NITROSTAT) 0.4 MG SL tablet PLACE ONE TABLET UNDER THE TONGUE EVERY 5 MINUTES FOR 3 DOSES AS NEEDED Patient taking differently: Place 0.4 mg under the tongue every 5 (five) minutes as needed.  07/22/17  Yes Herminio Commons, MD  ondansetron (ZOFRAN ODT) 4 MG disintegrating tablet 75m ODT q4 hours prn nausea/vomit Patient taking differently: Take 4 mg by mouth every 6 (six) hours as needed for nausea.  12/12/18  Yes ZMilton Ferguson MD  acetaminophen (TYLENOL) 325 MG tablet Take 1-2 tablets (325-650 mg total) by mouth every 6 (six) hours as needed. Patient taking differently: Take 1,300 mg by mouth every 6 (six) hours as needed.  11/17/13   BDanae Orleans PA-C  ALPRAZolam (Duanne Moron 0.5 MG tablet Take one tablet by mouth twice daily as needed for anxiety Patient not taking: Reported on 02/19/2019 03/09/15   BDanae Orleans PA-C  amLODipine (NORVASC) 10 MG tablet TAKE ONE TABLET BY MOUTH EVERY DAY. Patient taking differently: Take 10 mg by mouth daily.  09/23/18   KHerminio Commons MD  aspirin EC 81 MG tablet Take 81 mg by mouth every morning.     [provider]  cholecalciferol (VITAMIN D) 1000 units tablet Take 1,000 Units daily by mouth.    [provider]  ferrous sulfate 325 (65 FE) MG tablet Take 1 tablet (325 mg total) by mouth 3 (three) times daily after meals. Patient taking differently: Take 325 mg by mouth daily with breakfast.  03/09/15   BDanae Orleans PA-C  predniSONE (DELTASONE) 20 MG tablet 2 tabs po daily x 3 days Patient not taking: Reported on 02/19/2019 12/12/18   ZMilton Ferguson MD  SSD 1 % cream Apply 1 application topically daily.  02/22/19   [provider]  torsemide (DEMADEX) 20 MG tablet TAKE TWO TABLETS BY MOUTH TWICE DAILY Patient not taking: No sig reported 09/23/18   KHerminio Commons MD     Objective     Physical Exam: Vitals:   04/01/19 1615 04/01/19 1629 04/01/19 1630 04/01/19 1715  BP:      Pulse: (!) 102  (!) 106 99  Resp: _0 Temp:  99 F (37.2 C)    TempSrc:  Oral    SpO2: 95%  97% 94%  Weight:      Height:        General: appears to be stated age; alert, oriented Skin: warm, dry; erythema, swelling, increased warmth to the touch associated with the dorsal aspect of the right hand in the absence of any associated discharge; there also appears to be a healing abrasion associated with the dorsal aspect of the right hand. Head:  AT/[place] Mouth:  Oral mucosa membranes appear moist, normal dentition Neck: supple; trachea midline Heart:  RRR; did not appreciate any M/R/G Lungs: CTAB, did not appreciate any wheezes, rales, or rhonchi Abdomen: + BS; soft,  ND, NT Extremities: Erythema, swelling, increased warmth associated with the dorsal aspect of the right hand, as further described above.   Labs on Admission: I have personally reviewed following labs and imaging studies  CBC: Recent Labs  Lab 04/01/19 1611  WBC 13.3*  NEUTROABS 10.8*  HGB 12.0*  HCT 37.0*  MCV 100.5*  PLT 993   Basic Metabolic Panel: Recent Labs  Lab 04/01/19 1611  NA 139  K 4.0  CL 102  CO2 22  GLUCOSE 319*  BUN 69*  CREATININE 2.46*  CALCIUM 9.3   GFR: Estimated Creatinine Clearance: 38.2 mL/min (A) (by C-G formula based on SCr of 2.46 mg/dL (H)). Liver Function Tests: Recent Labs  Lab 04/01/19 1611  AST 22  ALT 19  ALKPHOS 57  BILITOT 0.8  PROT 8.4*  ALBUMIN 3.3*   No results for input(s): LIPASE, AMYLASE in the last 168 hours. No results for input(s): AMMONIA in the last 168 hours. Coagulation Profile: No results for input(s): INR, PROTIME in the last 168 hours. Cardiac Enzymes: No results for input(s): CKTOTAL, CKMB, CKMBINDEX, TROPONINI in the last 168 hours. BNP (last 3 results) No results for input(s): PROBNP in the last 8760 hours. HbA1C: No results for input(s): HGBA1C in the last 72 hours. CBG: No results for input(s): GLUCAP in the last 168 hours. Lipid Profile: No results for input(s): CHOL, HDL, LDLCALC, TRIG, CHOLHDL, LDLDIRECT in the last 72 hours. Thyroid Function Tests: No results for input(s): TSH, T4TOTAL, FREET4, T3FREE, THYROIDAB in the last 72 hours. Anemia Panel: No results for input(s): VITAMINB12, FOLATE, FERRITIN, TIBC, IRON, RETICCTPCT in the last 72 hours. Urine analysis:    Component Value Date/Time   COLORURINE YELLOW 12/14/2016 0941   APPEARANCEUR CLEAR 12/14/2016 0941   LABSPEC <1.005 (L) 12/14/2016 0941   PHURINE 5.5 12/14/2016 0941   GLUCOSEU NEGATIVE 12/14/2016 0941   HGBUR NEGATIVE 12/14/2016 0941   BILIRUBINUR NEGATIVE 12/14/2016 0941   KETONESUR NEGATIVE 12/14/2016 0941   PROTEINUR  NEGATIVE 12/14/2016 0941   UROBILINOGEN 1.0 03/02/2015 1100   NITRITE NEGATIVE 12/14/2016 0941   LEUKOCYTESUR NEGATIVE 12/14/2016 0941    Radiological Exams on Admission: Dg Hand Complete Right  Result Date: 04/01/2019 CLINICAL DATA:  Pain and swelling EXAM: RIGHT HAND - COMPLETE 3+ VIEW COMPARISON:  None. FINDINGS: Frontal, oblique, and lateral views obtained. There is marked soft tissue swelling diffusely without radiopaque foreign body or soft tissue air. No acute fracture or dislocation. Suspect previous trauma in the region of the triquetrum bone. There is osteoarthritic change in the radiocarpal joint. There is also osteoarthritic change in the scaphotrapezial joint. There is slight narrowing of all PIP joints. No erosive changes or bony destruction. IMPRESSION: Marked generalized soft tissue swelling without soft tissue air or radiopaque foreign body. No bony destruction or erosion. No acute fracture or dislocation. Suspect old trauma involving the right triquetrum bone. Areas of osteoarthritic change noted. Electronically Signed   By: Lowella Grip III M.D.   On: 04/01/2019 15:26     Assessment/Plan   [name] is a 73 y.o. male with medical history significant for coronary artery disease status post drug-eluting stent placement x2 in 5701, chronic diastolic heart failure, hypertension, type 2 diabetes mellitus, stage III/IV chronic kidney disease with baseline creatinine 2.2-2.4, who is admitted to [facility] on 04/01/2019 with sepsis due to right hand cellulitis after presenting from home to the [facility] ED complaining of right hand pain.   Principal Problem:   Cellulitis of right hand Active Problems:   Essential hypertension   Chronic kidney disease, stage 3, cardiorenal syndrome   Diabetes mellitus type 2 in obese (HCC)   Diastolic CHF, chronic (HCC)   Sepsis ([place])   Leukocytosis   #) Sepsis due to right hand cellulitis: Diagnosis on the basis of presenting  3 to 4  days of progressive erythema, tenderness, swelling, and increased warmth associated with the dorsal aspect of the right hand, with plain films of the right hand showing generalized soft tissue swelling without soft tissue air, foreign body, or acute bony abnormality.  Portal of entry appears to be healing abrasion on the dorsal aspect of the right hand.  Presentation appears to be more consistent with cellulitis as opposed to tenosynovitis, although will closely monitor for development of the later.  Right hand appears neurovascularly intact at this time, and without any evidence of compartment syndrome.  At the present time, there does not appear to be indication for empiric consultation of orthopedic surgery, but will monitor subsequent clinical course and response to interval IV antibiotics closely.   SIRS criteria met via presenting leukocytosis and tachycardia.  No associated evidence of endorgan damage at this time to constitute patient sepsis to be considered severe in nature, including presenting lactic acid found to be nonelevated at 1.6.  In the absence of any purulent discharge, MRSA is felt to be less likely. Rocephin administered in the ED today. However, given concomitant presenting sepsis, underlying diabetes, and hand involvement, will add clindamycin to existing Rocephin in order to expand antibiotic coverage to include that of anaerobes, MRSA, as well as providing an antitoxin benefit. Of note, while the patient appears to have been prescribed doxycycline by his PCP for his right hand cellulitis, the patient conveys to me that he only took 1 dose of this regimen before discontinuing the remainder of course due to nausea experience with this first dose.  No other source of underlying infectious process identified at this time.  In the context of stage 3/4 chronic kidney disease, chronic diastolic HF, current hemodynamic stability, and current ability to tolerate PO, will refrain from aggressive IV  fluids at this time.  Plan: Rocephin and IV clindamycin, as described above.  Auditor closely for results of blood cultures x2.  Repeat CBC with differential in the morning.  I have placed a nursing communication order requesting that the patient's right hand be elevated to a level in line with the heart, if not slightly higher than this in order to permit drainage of this right upper extremity.  Additionally, I have placed nursing communication order requesting that current distribution of erythema associated with her right hand be outlined at this time in order to trend subsequent response to interval IV antibiotics.     #) Chronic diastolic heart failure: Per my chart review, most recent echocardiogram appears to have occurred in July 2016 and demonstrated mild LVH, LVEF 60 to 65%, no evidence of focal wall motion abnormalities, grade 2 diastolic dysfunction, and a moderately dilated left atrium.  Outpatient diuretic regimen consists of Lasix 40 mg p.o. twice daily.  No evidence to suggest acutely decompensated heart failure at this time.  Plan: We will hold evening dose of Lasix in the setting of septic presentation, with plan to reevaluate volume status in the morning.  Monitor strict I's and O's.  Repeat BMP in the morning.     #) Hypertension: Outpatient antihypertensive regimen consists of Norvasc as well as aforementioned Lasix.  Patient has been normotensive since arrival in the emergency department today.  Plan: We will hold home antihypertensive medications for now on the basis of septic presentation.     #) Type 2 diabetes mellitus: The patient reports that this is managed via lifestyle modifications, and reports that he currently has not on any oral hypoglycemic  agents nor any exogenous insulin.  Most recent hemoglobin A1c appears to have been well controlled at 5.6%, however, the most recent hemoglobin A1c value that I am able to locate via chart review occurred in July 2016.   Interestingly, presenting blood sugar per presenting CMP found to be 319, which could possibly reflect acute elevation of blood sugar on the basis of physiologic stress stemming from concomitant presenting sepsis.  However, as poor glycemic control would serve as a predisposing/complicating factor for development of cellulitis, will recheck hemoglobin A1c value.   Plan: Accu-Cheks q. before meals and at bedtime with associated sliding scale insulin.  Check hemoglobin A1c, as further described above.     #) Stage III/IV chronic kidney disease: Baseline creatinine appears to be in the range of 2.2-2.4.  Presenting labs today reflect creatinine near baseline, with presenting creatinine value specifically found to be 2.46.  This is relative to most recent prior creatinine value of 2.32 when checked on 02/19/2019.  Plan: Monitor strict I's and O's.  Attempt to avoid nephrotoxic agents.  Repeat BMP in the morning.    DVT prophylaxis: SCD's Code Status: full Family Communication: (none) Disposition Plan:  Per Rounding Team Consults called: (none)  Admission status: Inpatient; med telemetry.   PLEASE NOTE THAT DRAGON DICTATION SOFTWARE WAS USED IN THE CONSTRUCTION OF THIS NOTE.   [facility] Pager [phone] From 3PM- 11PM.   Otherwise, please contact night-coverage  [email or web address] Password [facility]  04/01/2019, 5:26 PM

## 2019-04-02 ENCOUNTER — Inpatient Hospital Stay (HOSPITAL_COMMUNITY): Payer: Medicare Other

## 2019-04-02 DIAGNOSIS — A419 Sepsis, unspecified organism: Secondary | ICD-10-CM | POA: Diagnosis present

## 2019-04-02 DIAGNOSIS — D72829 Elevated white blood cell count, unspecified: Secondary | ICD-10-CM | POA: Diagnosis present

## 2019-04-02 LAB — BASIC METABOLIC PANEL
Anion gap: 14 (ref 5–15)
BUN: 61 mg/dL — ABNORMAL HIGH (ref 8–23)
CO2: 21 mmol/L — ABNORMAL LOW (ref 22–32)
Calcium: 9.3 mg/dL (ref 8.9–10.3)
Chloride: 107 mmol/L (ref 98–111)
Creatinine, Ser: 2.17 mg/dL — ABNORMAL HIGH (ref 0.61–1.24)
GFR calc Af Amer: 34 mL/min — ABNORMAL LOW (ref >60)
GFR calc non Af Amer: 29 mL/min — ABNORMAL LOW (ref >60)
Glucose, Bld: 251 mg/dL — ABNORMAL HIGH (ref 70–99)
Potassium: 3.9 mmol/L (ref 3.5–5.1)
Sodium: 142 mmol/L (ref 135–145)

## 2019-04-02 LAB — CBC WITH DIFFERENTIAL/PLATELET
Abs Immature Granulocytes: 0.07 10*3/uL (ref 0.00–0.07)
Basophils Absolute: 0.1 10*3/uL (ref 0.0–0.1)
Basophils Relative: 1 %
Eosinophils Absolute: 0.1 10*3/uL (ref 0.0–0.5)
Eosinophils Relative: 1 %
HCT: 35.5 % — ABNORMAL LOW (ref 39.0–52.0)
Hemoglobin: 11.2 g/dL — ABNORMAL LOW (ref 13.0–17.0)
Immature Granulocytes: 1 %
Lymphocytes Relative: 10 %
Lymphs Abs: 1.2 10*3/uL (ref 0.7–4.0)
MCH: 32.5 pg (ref 26.0–34.0)
MCHC: 31.5 g/dL (ref 30.0–36.0)
MCV: 102.9 fL — ABNORMAL HIGH (ref 80.0–100.0)
Monocytes Absolute: 1.2 10*3/uL — ABNORMAL HIGH (ref 0.1–1.0)
Monocytes Relative: 10 %
Neutro Abs: 9.3 10*3/uL — ABNORMAL HIGH (ref 1.7–7.7)
Neutrophils Relative %: 77 %
Platelets: 241 10*3/uL (ref 150–400)
RBC: 3.45 MIL/uL — ABNORMAL LOW (ref 4.22–5.81)
RDW: 13 % (ref 11.5–15.5)
WBC: 11.9 10*3/uL — ABNORMAL HIGH (ref 4.0–10.5)
nRBC: 0 % (ref 0.0–0.2)

## 2019-04-02 LAB — HEMOGLOBIN A1C
Hgb A1c MFr Bld: 8.4 % — ABNORMAL HIGH (ref 4.8–5.6)
Mean Plasma Glucose: 194.38 mg/dL

## 2019-04-02 LAB — GLUCOSE, CAPILLARY
Glucose-Capillary: 328 mg/dL — ABNORMAL HIGH (ref 70–99)
Glucose-Capillary: 231 mg/dL — ABNORMAL HIGH (ref 70–99)
Glucose-Capillary: 205 mg/dL — ABNORMAL HIGH (ref 70–99)

## 2019-04-02 LAB — MAGNESIUM: Magnesium: 1.9 mg/dL (ref 1.7–2.4)

## 2019-04-02 LAB — SEDIMENTATION RATE: Sed Rate: 47 mm/hr — ABNORMAL HIGH (ref 0–16)

## 2019-04-02 LAB — C-REACTIVE PROTEIN: CRP: 39.2 mg/dL — ABNORMAL HIGH (ref <1.0)

## 2019-04-02 MED ORDER — HYDROCODONE-ACETAMINOPHEN 5-325 MG PO TABS
1.0000 | ORAL_TABLET | Freq: Once | ORAL | Status: AC
  Administered 2019-04-02: 1 via ORAL
  Filled 2019-04-02: qty 1

## 2019-04-02 MED ORDER — INSULIN ASPART 100 UNIT/ML ~~LOC~~ SOLN
0.0000 [IU] | Freq: Every day | SUBCUTANEOUS | Status: DC
  Administered 2019-04-02: 2 [IU] via SUBCUTANEOUS

## 2019-04-02 MED ORDER — VANCOMYCIN HCL 10 G IV SOLR
1500.0000 mg | INTRAVENOUS | Status: DC
  Filled 2019-04-02 (×3): qty 1500

## 2019-04-02 MED ORDER — INSULIN ASPART 100 UNIT/ML ~~LOC~~ SOLN
4.0000 [IU] | Freq: Three times a day (TID) | SUBCUTANEOUS | Status: DC
  Administered 2019-04-02 – 2019-04-05 (×9): 4 [IU] via SUBCUTANEOUS

## 2019-04-02 MED ORDER — VANCOMYCIN HCL 1.5 G IV SOLR
1500.0000 mg | INTRAVENOUS | Status: DC
  Administered 2019-04-03 – 2019-04-05 (×3): 1500 mg via INTRAVENOUS
  Filled 2019-04-02 (×5): qty 1500

## 2019-04-02 MED ORDER — INSULIN ASPART 100 UNIT/ML ~~LOC~~ SOLN
0.0000 [IU] | Freq: Three times a day (TID) | SUBCUTANEOUS | Status: DC
  Administered 2019-04-02: 7 [IU] via SUBCUTANEOUS
  Administered 2019-04-02: 15 [IU] via SUBCUTANEOUS
  Administered 2019-04-03: 4 [IU] via SUBCUTANEOUS
  Administered 2019-04-03: 7 [IU] via SUBCUTANEOUS
  Administered 2019-04-03 – 2019-04-04 (×2): 4 [IU] via SUBCUTANEOUS
  Administered 2019-04-04: 11 [IU] via SUBCUTANEOUS
  Administered 2019-04-04 – 2019-04-05 (×2): 7 [IU] via SUBCUTANEOUS
  Administered 2019-04-05 – 2019-04-06 (×4): 4 [IU] via SUBCUTANEOUS

## 2019-04-02 MED ORDER — VANCOMYCIN HCL IN DEXTROSE 1-5 GM/200ML-% IV SOLN
1000.0000 mg | INTRAVENOUS | Status: AC
  Administered 2019-04-02 (×2): 1000 mg via INTRAVENOUS
  Filled 2019-04-02 (×2): qty 200

## 2019-04-02 NOTE — TOC Initial Note (Addendum)
Transition of Care [facility]) - Initial/Assessment Note    Patient Details  Name: [name] MRN: [id number] Date of Birth: 1946/08/17  [facility] CM/SW Contact:    Ibrahem Volkman, Chauncey Reading, RN Phone Number: 04/02/2019, 9:58 AM  Clinical Narrative:   CM consulted for home health aide. Met with patient at bedside. Introduced myself and explained role. Patient reports he wants someone to come to his house and help out with household chores for a few days post DC from hospital.  Discussed with patient Medicaid services and how to apply and that CM will be unable to secure those type services prior to DC especially for a short time frame as requested.  Discussed home health with patient as well.      He declines home health, including RN or PT.    He is from home alone, reports he has nieces and nephews for support who can help him post DC. He has PCP, reports he still drives to appointments. He ambulates with a cane. No issues obtaining/affording medications.   CM will sign off, please consult again if needs arise.   ADDENDUM: Sending referral to [facility] to see if patient is eligible for home care services such as house hold chores. They may not be able to help patient since he lives in [place], but are willing to assess.   Expected Discharge Plan: Home/Self Care Barriers to Discharge: No Barriers Identified   Patient Goals and CMS Choice Patient states their goals for this hospitalization and ongoing recovery are:: to return home with no pain CMS Medicare.gov Compare Post Acute Care list provided to:: Patient    Expected Discharge Plan and Services Expected Discharge Plan: Home/Self Care   Discharge Planning Services: CM Consult                              Prior Living Arrangements/Services   Lives with:: Self Patient language and need for interpreter reviewed:: Yes Do you feel safe going back to the place where you live?: Yes      Need for  Family Participation in Patient Care: No (Comment) Care giver support system in place?: Yes (comment) Current home services: DME Criminal Activity/Legal Involvement Pertinent to Current Situation/Hospitalization: No - Comment as needed  Activities of Daily Living Home Assistive Devices/Equipment: Walker (specify type), Eyeglasses ADL Screening (condition at time of admission) Patient's cognitive ability adequate to safely complete daily activities?: Yes Is the patient deaf or have difficulty hearing?: No Does the patient have difficulty seeing, even when wearing glasses/contacts?: No Does the patient have difficulty concentrating, remembering, or making decisions?: No Patient able to express need for assistance with ADLs?: Yes Does the patient have difficulty dressing or bathing?: No Independently performs ADLs?: Yes (appropriate for developmental age) Does the patient have difficulty walking or climbing stairs?: No Weakness of Legs: Both Weakness of Arms/Hands: Right      Emotional Assessment Appearance:: Appears stated age Attitude/Demeanor/Rapport: Engaged Affect (typically observed): Accepting, Calm Orientation: : Oriented to Self, Oriented to Place, Oriented to  Time, Oriented to Situation Alcohol / Substance Use: Not Applicable Psych Involvement: No (comment)  Admission diagnosis:  Cellulitis of hand, right [L03.113] Patient Active Problem List   Diagnosis Date Noted  . Sepsis ([place]) 04/02/2019  . Leukocytosis 04/02/2019  . Cellulitis of right hand 04/01/2019  . Peripheral edema   . CHF (congestive heart failure) ([place]) 07/05/2015  . Acute congestive heart failure ([place])  07/04/2015  . Acute on chronic diastolic congestive heart failure ([place]) 07/04/2015  . HLD (hyperlipidemia) 07/04/2015  . Anxiety state 07/04/2015  . GERD (gastroesophageal reflux disease) 07/04/2015  . Heart block 07/04/2015  . Edema 03/11/2015  . Diastolic CHF, chronic ([place]) 03/11/2015  . S/P left TKA  03/07/2015  . Muscle weakness (generalized) 01/13/2014  . Knee stiffness 01/13/2014  . Gout 12/11/2013  . Cellulitis 11/19/2013  . Expected blood loss anemia 11/17/2013  . Obese 11/17/2013  . S/P right TKA 11/15/2013  . Anemia, normocytic normochromic 01/12/2013  . Diabetes mellitus type 2 in obese ([place]) 12/10/2012  . CAD S/P percutaneous coronary angioplasty 11/24/2012  . Overweight(278.02) 11/17/2012  . Obstructive sleep apnea 11/15/2012  . Chronic kidney disease, stage 3, cardiorenal syndrome 08/23/2011  . Essential hypertension    PCP:  Redmond School, MD Pharmacy:   [facility] [place], [place] [address] Phone: [phone] Fax: [phone]       Readmission Risk Interventions No flowsheet data found.

## 2019-04-02 NOTE — Progress Notes (Signed)
PROGRESS NOTE    [name]  MBT:[id number] DOB: 10/14/1946 DOA: 04/01/2019 PCP: Redmond School, MD   Brief Narrative:  Per HPI: [name] is a 73 y.o. male with medical history significant for coronary artery disease status post drug-eluting stent placement x2 in 5364, chronic diastolic heart failure, hypertension, type 2 diabetes mellitus, stage III/IV chronic kidney disease with baseline creatinine 2.2-2.4, who is admitted to [facility] on 04/01/2019 with sepsis due to right hand cellulitis after presenting from home to the [facility] ED complaining of right hand pain.  The patient reports 3 to 4 days of right hand discomfort associated with erythema, increased warmth, and swelling about the dorsal aspect of the right hand.  Denies any preceding trauma involving the right hand, and denies any preceding animal bites to the RUE.  He reports 1 day of subjective fever in the absence of chills or rigors.  Denies rash in any other location, and denies extension of the erythema into the fingers of the right hand. Within 1 day of onset of the aforementioned symptoms, the patient reports that he presented to his PCP for evaluation of such.  He reports that he was diagnosed with right hand cellulitis at that time, and prescribed doxycycline.  The patient reports that he felt nauseous after taking his first dose of doxycycline, prompting him to discontinue the rest of the prescribed regimen.  Subsequent to the single dose of doxycycline, the patient reports that he has had no additional antibiotics leading up to presentation to the emergency department today.  He denies any associated pallor, numbness, or paresthesias involving the right hand, and he also denies any right hand discharge.  Patient was admitted for sepsis due to right hand cellulitis and was initially placed on Rocephin and clindamycin which have now transition to vancomycin this morning.  Assessment & Plan:   Principal Problem:  Cellulitis of right hand Active Problems:   Essential hypertension   Chronic kidney disease, stage 3, cardiorenal syndrome   Diabetes mellitus type 2 in obese (HCC)   Diastolic CHF, chronic (HCC)   Sepsis (HCC)   Leukocytosis   1. Sepsis secondary to right hand cellulitis.  Continue on IV vancomycin for now and monitor blood cultures.  Will order repeat imaging of the right hand as well as left shoulder and monitor ESR and CRP.  Repeat CBC in a.m.  If there is progressive worsening, will need to consider transfer and evaluation by hand surgeon. 2. Chronic diastolic heart failure.  Monitor daily weights and resume Lasix with repeat BMP in a.m.  Currently with minimal positive fluid balance noted. 3. Hypertension.  Resume home Norvasc as well as Lasix due to elevated blood pressures. 4. Type 2 diabetes.  Recent hemoglobin A1c controlled at 5.6%.  Increase sliding scale dosing due to hyperglycemia noted. 5. Stage III-4 chronic kidney disease.  Currently at baseline creatinine levels.  Repeat a.m. labs.   DVT prophylaxis: SCDs Code Status: Full Family Communication: None Disposition Plan: Continue on IV vancomycin and reassess right lower extremity swelling and pain.  Repeat diagnostic imaging and monitor with ESR and CRP.   Consultants:   None  Procedures:   None  Antimicrobials:   Rocephin and clindamycin 4/9-4/10  Vancomycin 4/10->   Subjective: Patient seen and evaluated today with complaints of left shoulder and right hand pain.  He is having trouble moving his right hand and requires a lot of assistance.  Objective: Vitals:   04/01/19 1915 04/01/19 2002 04/01/19  2212 04/02/19 0519  BP:  133/66 122/62 (!) 156/80  Pulse: 98 99 95 99  Resp: (!) 21 16 16 16   Temp:  (!) 100.9 F (38.3 C) 99.5 F (37.5 C) 99.4 F (37.4 C)  TempSrc:  Oral Oral Oral  SpO2: 94% 98% 96% 96%  Weight:    130.5 kg  Height:        Intake/Output Summary (Last 24 hours) at 04/02/2019 1137  Last data filed at 04/02/2019 0900 Gross per 24 hour  Intake 801.97 ml  Output 200 ml  Net 601.97 ml   Filed Weights   04/01/19 1406 04/02/19 0519  Weight: 129.3 kg 130.5 kg    Examination:  General exam: Appears calm and comfortable  Respiratory system: Clear to auscultation. Respiratory effort normal. Cardiovascular system: S1 & S2 heard, RRR. No JVD, murmurs, rubs, gallops or clicks. No pedal edema. Gastrointestinal system: Abdomen is nondistended, soft and nontender. No organomegaly or masses felt. Normal bowel sounds heard. Central nervous system: Alert and oriented. No focal neurological deficits. Extremities: Symmetric 5 x 5 power. Skin: No rashes, lesions or ulcers, erythema tenderness and swelling to the right arm noted. Psychiatry: Judgement and insight appear normal. Mood & affect appropriate.     Data Reviewed: I have personally reviewed following labs and imaging studies  CBC: Recent Labs  Lab 04/01/19 1611 04/02/19 0557  WBC 13.3* 11.9*  NEUTROABS 10.8* 9.3*  HGB 12.0* 11.2*  HCT 37.0* 35.5*  MCV 100.5* 102.9*  PLT 222 469   Basic Metabolic Panel: Recent Labs  Lab 04/01/19 1611 04/02/19 0557  NA 139 142  K 4.0 3.9  CL 102 107  CO2 22 21*  GLUCOSE 319* 251*  BUN 69* 61*  CREATININE 2.46* 2.17*  CALCIUM 9.3 9.3  MG  --  1.9   GFR: Estimated Creatinine Clearance: 43.5 mL/min (A) (by C-G formula based on SCr of 2.17 mg/dL (H)). Liver Function Tests: Recent Labs  Lab 04/01/19 1611  AST 22  ALT 19  ALKPHOS 57  BILITOT 0.8  PROT 8.4*  ALBUMIN 3.3*   No results for input(s): LIPASE, AMYLASE in the last 168 hours. No results for input(s): AMMONIA in the last 168 hours. Coagulation Profile: No results for input(s): INR, PROTIME in the last 168 hours. Cardiac Enzymes: No results for input(s): CKTOTAL, CKMB, CKMBINDEX, TROPONINI in the last 168 hours. BNP (last 3 results) No results for input(s): PROBNP in the last 8760 hours. HbA1C: No  results for input(s): HGBA1C in the last 72 hours. CBG: Recent Labs  Lab 04/01/19 2207 04/02/19 1135  GLUCAP 220* 328*   Lipid Profile: No results for input(s): CHOL, HDL, LDLCALC, TRIG, CHOLHDL, LDLDIRECT in the last 72 hours. Thyroid Function Tests: No results for input(s): TSH, T4TOTAL, FREET4, T3FREE, THYROIDAB in the last 72 hours. Anemia Panel: No results for input(s): VITAMINB12, FOLATE, FERRITIN, TIBC, IRON, RETICCTPCT in the last 72 hours. Sepsis Labs: Recent Labs  Lab 04/01/19 1611  LATICACIDVEN 1.6    Recent Results (from the past 240 hour(s))  Culture, blood (routine x 2)     Status: None (Preliminary result)   Collection Time: 04/01/19  9:05 PM  Result Value Ref Range Status   Specimen Description BLOOD RIGHT ANTECUBITAL  Final   Special Requests   Final    BOTTLES DRAWN AEROBIC AND ANAEROBIC Blood Culture adequate volume   Culture   Final    NO GROWTH < 12 HOURS Performed at [facility], [address] [place], [place] [address]  Report Status PENDING  Incomplete  Culture, blood (routine x 2)     Status: None (Preliminary result)   Collection Time: 04/01/19  9:10 PM  Result Value Ref Range Status   Specimen Description BLOOD RIGHT HAND  Final   Special Requests   Final    AEROBIC BOTTLE ONLY Blood Culture results may not be optimal due to an inadequate volume of blood received in culture bottles   Culture   Final    NO GROWTH < 12 HOURS Performed at [facility], [address], [place], [place] [address]    Report Status PENDING  Incomplete         Radiology Studies: Dg Hand Complete Right  Result Date: 04/01/2019 CLINICAL DATA:  Pain and swelling EXAM: RIGHT HAND - COMPLETE 3+ VIEW COMPARISON:  None. FINDINGS: Frontal, oblique, and lateral views obtained. There is marked soft tissue swelling diffusely without radiopaque foreign body or soft tissue air. No acute fracture or dislocation. Suspect previous trauma in the region of the triquetrum bone.  There is osteoarthritic change in the radiocarpal joint. There is also osteoarthritic change in the scaphotrapezial joint. There is slight narrowing of all PIP joints. No erosive changes or bony destruction. IMPRESSION: Marked generalized soft tissue swelling without soft tissue air or radiopaque foreign body. No bony destruction or erosion. No acute fracture or dislocation. Suspect old trauma involving the right triquetrum bone. Areas of osteoarthritic change noted. Electronically Signed   By: Lowella Grip III M.D.   On: 04/01/2019 15:26        Scheduled Meds: . aspirin EC  81 mg Oral q morning - 10a  . calcitRIOL  0.25 mcg Oral Daily  . cholecalciferol  1,000 Units Oral Daily  . ferrous sulfate  325 mg Oral Q breakfast  . insulin aspart  0-20 Units Subcutaneous TID WC  . insulin aspart  0-5 Units Subcutaneous QHS  . insulin aspart  4 Units Subcutaneous TID WC   Continuous Infusions: . [START ON 04/03/2019] vancomycin       LOS: 1 day    Time spent: 30 minutes    Randale Carvalho Darleen Crocker, DO [facility] Pager [phone]  If 7PM-7AM, please contact night-coverage [email or web address] Password [facility] 04/02/2019, 11:37 AM

## 2019-04-02 NOTE — Progress Notes (Signed)
Pharmacy Antibiotic Note  [name] is a 73 y.o. male admitted on 04/01/2019 with cellulitis.  Pharmacy has been consulted for vancomycin dosing.  Plan: Vancomycin 1551m IV every 24 hours.  Goal trough 10-15 mcg/mL.  Height: 6' 2"  (188 cm) Weight: 287 lb 11.2 oz (130.5 kg) IBW/kg (Calculated) : 82.2  Temp (24hrs), Avg:99.6 F (37.6 C), Min:99 F (37.2 C), Max:100.9 F (38.3 C)  Recent Labs  Lab 04/01/19 1611 04/02/19 0557  WBC 13.3* 11.9*  CREATININE 2.46* 2.17*  LATICACIDVEN 1.6  --     Estimated Creatinine Clearance: 43.5 mL/min (A) (by C-G formula based on SCr of 2.17 mg/dL (H)).    Allergies  Allergen Reactions  . Niacin And Related Other (See Comments)    Reaction: flushing and burning side effects even with extended release    Antimicrobials this admission: 4/10 vancomycin >>  4/10 ceftriaxone x 1   Microbiology results: 4/10 BCx: sent   Thank you for allowing pharmacy to be a part of this patient's care.  GDonna ChristenCoffee 04/02/2019 9:20 AM

## 2019-04-03 LAB — CBC
HCT: 33.8 % — ABNORMAL LOW (ref 39.0–52.0)
Hemoglobin: 10.6 g/dL — ABNORMAL LOW (ref 13.0–17.0)
MCH: 32.3 pg (ref 26.0–34.0)
MCHC: 31.4 g/dL (ref 30.0–36.0)
MCV: 103 fL — ABNORMAL HIGH (ref 80.0–100.0)
Platelets: 255 10*3/uL (ref 150–400)
RBC: 3.28 MIL/uL — ABNORMAL LOW (ref 4.22–5.81)
RDW: 13.1 % (ref 11.5–15.5)
WBC: 13.2 10*3/uL — ABNORMAL HIGH (ref 4.0–10.5)
nRBC: 0 % (ref 0.0–0.2)

## 2019-04-03 LAB — BASIC METABOLIC PANEL
Anion gap: 13 (ref 5–15)
BUN: 51 mg/dL — ABNORMAL HIGH (ref 8–23)
CO2: 22 mmol/L (ref 22–32)
Calcium: 9.2 mg/dL (ref 8.9–10.3)
Chloride: 108 mmol/L (ref 98–111)
Creatinine, Ser: 2.11 mg/dL — ABNORMAL HIGH (ref 0.61–1.24)
GFR calc Af Amer: 35 mL/min — ABNORMAL LOW (ref >60)
GFR calc non Af Amer: 30 mL/min — ABNORMAL LOW (ref >60)
Glucose, Bld: 233 mg/dL — ABNORMAL HIGH (ref 70–99)
Potassium: 4.1 mmol/L (ref 3.5–5.1)
Sodium: 143 mmol/L (ref 135–145)

## 2019-04-03 LAB — GLUCOSE, CAPILLARY
Glucose-Capillary: 191 mg/dL — ABNORMAL HIGH (ref 70–99)
Glucose-Capillary: 213 mg/dL — ABNORMAL HIGH (ref 70–99)
Glucose-Capillary: 159 mg/dL — ABNORMAL HIGH (ref 70–99)
Glucose-Capillary: 146 mg/dL — ABNORMAL HIGH (ref 70–99)

## 2019-04-03 MED ORDER — IBUPROFEN 800 MG PO TABS
800.0000 mg | ORAL_TABLET | Freq: Three times a day (TID) | ORAL | Status: DC
  Administered 2019-04-03 – 2019-04-06 (×10): 800 mg via ORAL
  Filled 2019-04-03 (×10): qty 1

## 2019-04-03 MED ORDER — LIDOCAINE 5 % EX PTCH
1.0000 | MEDICATED_PATCH | CUTANEOUS | Status: DC
  Administered 2019-04-03 – 2019-04-06 (×4): 1 via TRANSDERMAL
  Filled 2019-04-03 (×4): qty 1

## 2019-04-03 NOTE — Progress Notes (Signed)
PROGRESS NOTE    [name]  IDP:[id number] DOB: 23-Apr-1946 DOA: 04/01/2019 PCP: Redmond School, MD   Brief Narrative:  Per HPI: [name] a 73 y.o.malewith medical history significant forcoronary artery disease status post drug-eluting stent placement x2 in 4431, chronic diastolic heart failure, hypertension, type 2 diabetes mellitus, stage III/IV chronic kidney disease with baseline creatinine 2.2-2.4,who is admitted [facility] 04/01/2019 with sepsis due to right hand cellulitis after [facility] home to [facility] [place] of right hand pain.  The patient reports 3 to 4 days of right hand discomfort associated with erythema, increased warmth, and swellingabout the dorsal aspect of the right hand.Denies any preceding trauma involving the right hand, and denies any preceding animal bites to the RUE.He reports 1 day of subjective fever in the absence of chills or rigors. Denies rash in any other location,and denies extension of the erythema into the fingersof the right hand. Within 1 day of onset of theaforementioned symptoms, the patient reports that he presented to his PCP for evaluation of such. He reports that he was diagnosed with right hand cellulitis at that time, and prescribed doxycycline.The patient reports that he felt nauseous after taking his first dose of doxycycline, prompting him to discontinue the rest of the prescribed regimen. Subsequent to the single dose of doxycycline, the patient reports that he has had no additional antibiotics leading up to presentation to the emergency department today. He denies any associated pallor,numbness,or paresthesias involving the right hand, and he also denies any right hand discharge.  Patient was admitted for sepsis due to right hand cellulitis and was initially placed on Rocephin and clindamycin which transitioned to vancomycin on 4/10.  He appears to be having some improvement but continues to  have persistent left shoulder pain.   Assessment & Plan:   Principal Problem:   Cellulitis of right hand Active Problems:   Essential hypertension   Chronic kidney disease, stage 3, cardiorenal syndrome   Diabetes mellitus type 2 in obese (HCC)   Diastolic CHF, chronic (HCC)   Sepsis (HCC)   Leukocytosis  1. Sepsis secondary to right hand cellulitis.  Continue on IV vancomycin for now and monitor blood cultures with no growth noted in the last 2 days.    Continue to monitor on repeat CBC.  Appears to be having improvement to swelling and erythema at this time. 2. Chronic diastolic heart failure.  Monitor daily weights and resume Lasix with repeat BMP in a.m.  Currently with minimal positive fluid balance noted. 3. Hypertension-controlled.  Resume home Norvasc as well as Lasix due to elevated blood pressures. 4. Type 2 diabetes.  Recent hemoglobin A1c controlled at 5.6%.  Increase sliding scale dosing due to hyperglycemia which is now improving. 5. Stage III-4 chronic kidney disease.  Currently at baseline creatinine levels.  Repeat a.m. labs.   DVT prophylaxis: SCDs Code Status: Full Family Communication: None Disposition Plan: Continue on IV vancomycin and reassess right lower extremity swelling and pain.    PT/OT evaluation and ice packs to be added.  Ibuprofen and Lidoderm patch also added.   Consultants:   None  Procedures:   None  Antimicrobials:   Rocephin and clindamycin 4/9-4/10  Vancomycin 4/10->  Subjective: Patient seen and evaluated today with improved pain levels, but continues to have persistent left shoulder pain.  Swelling and erythema to right hand appears to be improved and he is having better range of motion.  Objective: Vitals:   04/02/19 1334 04/02/19 2240 04/03/19 0500  04/03/19 0542  BP: (!) 146/78 138/71  121/68  Pulse: 99 96  95  Resp: 18 20  16   Temp: 98.7 F (37.1 C) 99.2 F (37.3 C)  99.6 F (37.6 C)  TempSrc:  Oral  Oral   SpO2: 100% 96%  95%  Weight:   135.5 kg   Height:        Intake/Output Summary (Last 24 hours) at 04/03/2019 0941 Last data filed at 04/03/2019 0900 Gross per 24 hour  Intake 720 ml  Output 750 ml  Net -30 ml   Filed Weights   04/01/19 1406 04/02/19 0519 04/03/19 0500  Weight: 129.3 kg 130.5 kg 135.5 kg    Examination:  General exam: Appears calm and comfortable  Respiratory system: Clear to auscultation. Respiratory effort normal. Cardiovascular system: S1 & S2 heard, RRR. No JVD, murmurs, rubs, gallops or clicks. No pedal edema. Gastrointestinal system: Abdomen is nondistended, soft and nontender. No organomegaly or masses felt. Normal bowel sounds heard. Central nervous system: Alert and oriented. No focal neurological deficits. Extremities: Symmetric 5 x 5 power. Skin: No rashes, lesions or ulcers Psychiatry: Judgement and insight appear normal. Mood & affect appropriate.     Data Reviewed: I have personally reviewed following labs and imaging studies  CBC: Recent Labs  Lab 04/01/19 1611 04/02/19 0557 04/03/19 0650  WBC 13.3* 11.9* 13.2*  NEUTROABS 10.8* 9.3*  --   HGB 12.0* 11.2* 10.6*  HCT 37.0* 35.5* 33.8*  MCV 100.5* 102.9* 103.0*  PLT 222 241 858   Basic Metabolic Panel: Recent Labs  Lab 04/01/19 1611 04/02/19 0557 04/03/19 0650  NA 139 142 143  K 4.0 3.9 4.1  CL 102 107 108  CO2 22 21* 22  GLUCOSE 319* 251* 233*  BUN 69* 61* 51*  CREATININE 2.46* 2.17* 2.11*  CALCIUM 9.3 9.3 9.2  MG  --  1.9  --    GFR: Estimated Creatinine Clearance: 45.6 mL/min (A) (by C-G formula based on SCr of 2.11 mg/dL (H)). Liver Function Tests: Recent Labs  Lab 04/01/19 1611  AST 22  ALT 19  ALKPHOS 57  BILITOT 0.8  PROT 8.4*  ALBUMIN 3.3*   No results for input(s): LIPASE, AMYLASE in the last 168 hours. No results for input(s): AMMONIA in the last 168 hours. Coagulation Profile: No results for input(s): INR, PROTIME in the last 168 hours. Cardiac  Enzymes: No results for input(s): CKTOTAL, CKMB, CKMBINDEX, TROPONINI in the last 168 hours. BNP (last 3 results) No results for input(s): PROBNP in the last 8760 hours. HbA1C: Recent Labs    04/02/19 0557  HGBA1C 8.4*   CBG: Recent Labs  Lab 04/01/19 2207 04/02/19 1135 04/02/19 1619 04/02/19 2118 04/03/19 0722  GLUCAP 220* 328* 231* 205* 191*   Lipid Profile: No results for input(s): CHOL, HDL, LDLCALC, TRIG, CHOLHDL, LDLDIRECT in the last 72 hours. Thyroid Function Tests: No results for input(s): TSH, T4TOTAL, FREET4, T3FREE, THYROIDAB in the last 72 hours. Anemia Panel: No results for input(s): VITAMINB12, FOLATE, FERRITIN, TIBC, IRON, RETICCTPCT in the last 72 hours. Sepsis Labs: Recent Labs  Lab 04/01/19 1611  LATICACIDVEN 1.6    Recent Results (from the past 240 hour(s))  Culture, blood (routine x 2)     Status: None (Preliminary result)   Collection Time: 04/01/19  9:05 PM  Result Value Ref Range Status   Specimen Description BLOOD RIGHT ANTECUBITAL  Final   Special Requests   Final    BOTTLES DRAWN AEROBIC AND ANAEROBIC Blood Culture adequate  volume   Culture   Final    NO GROWTH 2 DAYS Performed at [facility], [address], [place], [place] [address]    Report Status PENDING  Incomplete  Culture, blood (routine x 2)     Status: None (Preliminary result)   Collection Time: 04/01/19  9:10 PM  Result Value Ref Range Status   Specimen Description BLOOD RIGHT HAND  Final   Special Requests   Final    AEROBIC BOTTLE ONLY Blood Culture results may not be optimal due to an inadequate volume of blood received in culture bottles   Culture   Final    NO GROWTH 2 DAYS Performed at [facility], [address] [place], [place] [address]    Report Status PENDING  Incomplete         Radiology Studies: Dg Shoulder Left Port  Result Date: 04/02/2019 CLINICAL DATA:  Left shoulder pain. EXAM: LEFT SHOULDER - 1 VIEW COMPARISON:  None. FINDINGS: No acute  fracture or dislocation is identified. There is mild acromioclavicular spurring. The acromiohumeral interval is borderline narrowed. The soft tissues are unremarkable. IMPRESSION: Mild acromioclavicular osteoarthrosis without acute osseous abnormality identified. Electronically Signed   By: Logan Bores M.D.   On: 04/02/2019 13:34   Dg Hand Complete Right  Result Date: 04/02/2019 CLINICAL DATA:  Right hand pain and swelling EXAM: RIGHT HAND - COMPLETE 3+ VIEW COMPARISON:  Or 920 FINDINGS: No fracture or dislocation. Generalized osteopenia. Mild osteoarthritis of the first MCP joint and first IP joint. Soft tissue swelling along the dorsal aspect of the hand. IMPRESSION: No acute osseous injury of the right hand. Electronically Signed   By: Kathreen Devoid   On: 04/02/2019 13:33   Dg Hand Complete Right  Result Date: 04/01/2019 CLINICAL DATA:  Pain and swelling EXAM: RIGHT HAND - COMPLETE 3+ VIEW COMPARISON:  None. FINDINGS: Frontal, oblique, and lateral views obtained. There is marked soft tissue swelling diffusely without radiopaque foreign body or soft tissue air. No acute fracture or dislocation. Suspect previous trauma in the region of the triquetrum bone. There is osteoarthritic change in the radiocarpal joint. There is also osteoarthritic change in the scaphotrapezial joint. There is slight narrowing of all PIP joints. No erosive changes or bony destruction. IMPRESSION: Marked generalized soft tissue swelling without soft tissue air or radiopaque foreign body. No bony destruction or erosion. No acute fracture or dislocation. Suspect old trauma involving the right triquetrum bone. Areas of osteoarthritic change noted. Electronically Signed   By: Lowella Grip III M.D.   On: 04/01/2019 15:26        Scheduled Meds: . aspirin EC  81 mg Oral q morning - 10a  . calcitRIOL  0.25 mcg Oral Daily  . cholecalciferol  1,000 Units Oral Daily  . ferrous sulfate  325 mg Oral Q breakfast  . ibuprofen  800  mg Oral TID  . insulin aspart  0-20 Units Subcutaneous TID WC  . insulin aspart  0-5 Units Subcutaneous QHS  . insulin aspart  4 Units Subcutaneous TID WC  . lidocaine  1 patch Transdermal Q24H   Continuous Infusions: . vancomycin 1,500 mg (04/03/19 0907)     LOS: 2 days    Time spent: 30 minutes    Marypat Kimmet Darleen Crocker, DO [facility] Pager [phone]  If 7PM-7AM, please contact night-coverage [email or web address] Password [facility] 04/03/2019, 9:41 AM

## 2019-04-04 LAB — GLUCOSE, CAPILLARY
Glucose-Capillary: 175 mg/dL — ABNORMAL HIGH (ref 70–99)
Glucose-Capillary: 224 mg/dL — ABNORMAL HIGH (ref 70–99)
Glucose-Capillary: 276 mg/dL — ABNORMAL HIGH (ref 70–99)
Glucose-Capillary: 170 mg/dL — ABNORMAL HIGH (ref 70–99)

## 2019-04-04 LAB — CBC
HCT: 37.3 % — ABNORMAL LOW (ref 39.0–52.0)
Hemoglobin: 11.8 g/dL — ABNORMAL LOW (ref 13.0–17.0)
MCH: 32.7 pg (ref 26.0–34.0)
MCHC: 31.6 g/dL (ref 30.0–36.0)
MCV: 103.3 fL — ABNORMAL HIGH (ref 80.0–100.0)
Platelets: 279 10*3/uL (ref 150–400)
RBC: 3.61 MIL/uL — ABNORMAL LOW (ref 4.22–5.81)
RDW: 13.2 % (ref 11.5–15.5)
WBC: 14.1 10*3/uL — ABNORMAL HIGH (ref 4.0–10.5)
nRBC: 0 % (ref 0.0–0.2)

## 2019-04-04 LAB — BASIC METABOLIC PANEL
Anion gap: 13 (ref 5–15)
BUN: 54 mg/dL — ABNORMAL HIGH (ref 8–23)
CO2: 21 mmol/L — ABNORMAL LOW (ref 22–32)
Calcium: 9.4 mg/dL (ref 8.9–10.3)
Chloride: 110 mmol/L (ref 98–111)
Creatinine, Ser: 2.06 mg/dL — ABNORMAL HIGH (ref 0.61–1.24)
GFR calc Af Amer: 36 mL/min — ABNORMAL LOW (ref >60)
GFR calc non Af Amer: 31 mL/min — ABNORMAL LOW (ref >60)
Glucose, Bld: 208 mg/dL — ABNORMAL HIGH (ref 70–99)
Potassium: 4.3 mmol/L (ref 3.5–5.1)
Sodium: 144 mmol/L (ref 135–145)

## 2019-04-04 MED ORDER — POLYETHYLENE GLYCOL 3350 17 G PO PACK
17.0000 g | PACK | Freq: Every day | ORAL | Status: DC
  Administered 2019-04-04 – 2019-04-06 (×3): 17 g via ORAL
  Filled 2019-04-04 (×3): qty 1

## 2019-04-04 MED ORDER — GABAPENTIN 100 MG PO CAPS
100.0000 mg | ORAL_CAPSULE | Freq: Three times a day (TID) | ORAL | Status: DC
  Administered 2019-04-04 – 2019-04-06 (×7): 100 mg via ORAL
  Filled 2019-04-04 (×7): qty 1

## 2019-04-04 NOTE — Progress Notes (Signed)
PROGRESS NOTE    [name]  XMI:[id number] DOB: 1946/03/04 DOA: 04/01/2019 PCP: Redmond School, MD   Brief Narrative:  Per HPI: [name] a 73 y.o.malewith medical history significant forcoronary artery disease status post drug-eluting stent placement x2 in 8250, chronic diastolic heart failure, hypertension, type 2 diabetes mellitus, stage III/IV chronic kidney disease with baseline creatinine 2.2-2.4,who is admitted [facility] 04/01/2019 with sepsis due to right hand cellulitis after presenting from home to theAP [place] of right hand pain.  The patient reports 3 to 4 days of right hand discomfort associated with erythema, increased warmth, and swellingabout the dorsal aspect of the right hand.Denies any preceding trauma involving the right hand, and denies any preceding animal bites to the RUE.He reports 1 day of subjective fever in the absence of chills or rigors. Denies rash in any other location,and denies extension of the erythema into the fingersof the right hand. Within 1 day of onset of theaforementioned symptoms, the patient reports that he presented to his PCP for evaluation of such. He reports that he was diagnosed with right hand cellulitis at that time, and prescribed doxycycline.The patient reports that he felt nauseous after taking his first dose of doxycycline, prompting him to discontinue the rest of the prescribed regimen. Subsequent to the single dose of doxycycline, the patient reports that he has had no additional antibiotics leading up to presentation to the emergency department today. He denies any associated pallor,numbness,or paresthesias involving the right hand, and he also denies any right hand discharge.  Patient was admitted for sepsis due to right hand cellulitis and was initially placed on Rocephin and clindamycin which transitioned to vancomycin on 4/10.  He appears to be having some improvement but continues to  have persistent left shoulder pain.   Assessment & Plan:   Principal Problem:   Cellulitis of right hand Active Problems:   Essential hypertension   Chronic kidney disease, stage 3, cardiorenal syndrome   Diabetes mellitus type 2 in obese (HCC)   Diastolic CHF, chronic (HCC)   Sepsis (HCC)   Leukocytosis   1. Sepsis secondary to right hand cellulitis-slowly improving. Continue on IV vancomycin for now and monitor blood cultures with no growth noted in the last 2 days.   Continue to monitor on repeat CBC.  Appears to be having improvement to swelling and erythema at this time. 2. Chronic diastolic heart failure. Monitor daily weights and resume Lasix with repeat BMP in a.m. Currently with minimal positive fluid balance noted.  Weights currently remained stable. 3. Hypertension-controlled. Resume home Norvasc as well as Lasix due to elevated blood pressures. 4. Type 2 diabetes. Recent hemoglobin A1c controlled at 5.6%.  Continue current sliding scale with blood glucose reading in the low 200 range. 5. Stage III-4 chronic kidney disease. Currently at baseline creatinine levels. Repeat a.m. labs.   DVT prophylaxis:SCDs Code Status:Full Family Communication:None Disposition Plan:Continue on IV vancomycin and reassess right lower extremity swelling and pain.   PT/OT evaluation and ice packs to be added.  Ibuprofen and Lidoderm patch also added on 4/11.  Will add gabapentin on 4/12.   Consultants:  None  Procedures:  None  Antimicrobials:  Rocephin and clindamycin 4/9-4/10  Vancomycin4/10->  Subjective: Patient seen and evaluated today with no new acute complaints or concerns. No acute concerns or events noted overnight.  Objective: Vitals:   04/03/19 0542 04/03/19 1300 04/03/19 2139 04/04/19 0514  BP: 121/68 134/70 (!) 112/44 (!) 149/68  Pulse: 95 94 66 86  Resp: 16 18 18 18   Temp: 99.6 F (37.6 C) 99.6 F (37.6 C) 98.7 F (37.1 C) 98.9 F  (37.2 C)  TempSrc: Oral Oral Oral Oral  SpO2: 95% 95% 96% 100%  Weight:    131.8 kg  Height:        Intake/Output Summary (Last 24 hours) at 04/04/2019 9892 Last data filed at 04/04/2019 1194 Gross per 24 hour  Intake 1580.99 ml  Output 1200 ml  Net 380.99 ml   Filed Weights   04/02/19 0519 04/03/19 0500 04/04/19 0514  Weight: 130.5 kg 135.5 kg 131.8 kg    Examination:  General exam: Appears calm and comfortable  Respiratory system: Clear to auscultation. Respiratory effort normal. Cardiovascular system: S1 & S2 heard, RRR. No JVD, murmurs, rubs, gallops or clicks. No pedal edema. Gastrointestinal system: Abdomen is nondistended, soft and nontender. No organomegaly or masses felt. Normal bowel sounds heard. Central nervous system: Alert and oriented. No focal neurological deficits. Extremities: Symmetric 5 x 5 power.  Continues to have left shoulder pain with limited range of motion. Skin: No rashes, lesions or ulcers, right hand edema and erythema improved. Psychiatry: Judgement and insight appear normal. Mood & affect appropriate.     Data Reviewed: I have personally reviewed following labs and imaging studies  CBC: Recent Labs  Lab 04/01/19 1611 04/02/19 0557 04/03/19 0650 04/04/19 0617  WBC 13.3* 11.9* 13.2* 14.1*  NEUTROABS 10.8* 9.3*  --   --   HGB 12.0* 11.2* 10.6* 11.8*  HCT 37.0* 35.5* 33.8* 37.3*  MCV 100.5* 102.9* 103.0* 103.3*  PLT 222 241 255 174   Basic Metabolic Panel: Recent Labs  Lab 04/01/19 1611 04/02/19 0557 04/03/19 0650 04/04/19 0617  NA 139 142 143 144  K 4.0 3.9 4.1 4.3  CL 102 107 108 110  CO2 22 21* 22 21*  GLUCOSE 319* 251* 233* 208*  BUN 69* 61* 51* 54*  CREATININE 2.46* 2.17* 2.11* 2.06*  CALCIUM 9.3 9.3 9.2 9.4  MG  --  1.9  --   --    GFR: Estimated Creatinine Clearance: 46.1 mL/min (A) (by C-G formula based on SCr of 2.06 mg/dL (H)). Liver Function Tests: Recent Labs  Lab 04/01/19 1611  AST 22  ALT 19  ALKPHOS 57   BILITOT 0.8  PROT 8.4*  ALBUMIN 3.3*   No results for input(s): LIPASE, AMYLASE in the last 168 hours. No results for input(s): AMMONIA in the last 168 hours. Coagulation Profile: No results for input(s): INR, PROTIME in the last 168 hours. Cardiac Enzymes: No results for input(s): CKTOTAL, CKMB, CKMBINDEX, TROPONINI in the last 168 hours. BNP (last 3 results) No results for input(s): PROBNP in the last 8760 hours. HbA1C: Recent Labs    04/02/19 0557  HGBA1C 8.4*   CBG: Recent Labs  Lab 04/03/19 0722 04/03/19 1107 04/03/19 1610 04/03/19 2136 04/04/19 0725  GLUCAP 191* 213* 159* 146* 175*   Lipid Profile: No results for input(s): CHOL, HDL, LDLCALC, TRIG, CHOLHDL, LDLDIRECT in the last 72 hours. Thyroid Function Tests: No results for input(s): TSH, T4TOTAL, FREET4, T3FREE, THYROIDAB in the last 72 hours. Anemia Panel: No results for input(s): VITAMINB12, FOLATE, FERRITIN, TIBC, IRON, RETICCTPCT in the last 72 hours. Sepsis Labs: Recent Labs  Lab 04/01/19 1611  LATICACIDVEN 1.6    Recent Results (from the past 240 hour(s))  Culture, blood (routine x 2)     Status: None (Preliminary result)   Collection Time: 04/01/19  9:05 PM  Result Value Ref Range  Status   Specimen Description BLOOD RIGHT ANTECUBITAL  Final   Special Requests   Final    BOTTLES DRAWN AEROBIC AND ANAEROBIC Blood Culture adequate volume   Culture   Final    NO GROWTH 3 DAYS Performed at [facility], [address] [place], [place] [address]    Report Status PENDING  Incomplete  Culture, blood (routine x 2)     Status: None (Preliminary result)   Collection Time: 04/01/19  9:10 PM  Result Value Ref Range Status   Specimen Description BLOOD RIGHT HAND  Final   Special Requests   Final    AEROBIC BOTTLE ONLY Blood Culture results may not be optimal due to an inadequate volume of blood received in culture bottles   Culture   Final    NO GROWTH 3 DAYS Performed at [facility], [address], [place], [place] [address]    Report Status PENDING  Incomplete         Radiology Studies: Dg Shoulder Left Port  Result Date: 04/02/2019 CLINICAL DATA:  Left shoulder pain. EXAM: LEFT SHOULDER - 1 VIEW COMPARISON:  None. FINDINGS: No acute fracture or dislocation is identified. There is mild acromioclavicular spurring. The acromiohumeral interval is borderline narrowed. The soft tissues are unremarkable. IMPRESSION: Mild acromioclavicular osteoarthrosis without acute osseous abnormality identified. Electronically Signed   By: Logan Bores M.D.   On: 04/02/2019 13:34   Dg Hand Complete Right  Result Date: 04/02/2019 CLINICAL DATA:  Right hand pain and swelling EXAM: RIGHT HAND - COMPLETE 3+ VIEW COMPARISON:  Or 920 FINDINGS: No fracture or dislocation. Generalized osteopenia. Mild osteoarthritis of the first MCP joint and first IP joint. Soft tissue swelling along the dorsal aspect of the hand. IMPRESSION: No acute osseous injury of the right hand. Electronically Signed   By: Kathreen Devoid   On: 04/02/2019 13:33        Scheduled Meds:  aspirin EC  81 mg Oral q morning - 10a   calcitRIOL  0.25 mcg Oral Daily   cholecalciferol  1,000 Units Oral Daily   ferrous sulfate  325 mg Oral Q breakfast   gabapentin  100 mg Oral TID   ibuprofen  800 mg Oral TID   insulin aspart  0-20 Units Subcutaneous TID WC   insulin aspart  0-5 Units Subcutaneous QHS   insulin aspart  4 Units Subcutaneous TID WC   lidocaine  1 patch Transdermal Q24H   polyethylene glycol  17 g Oral Daily   Continuous Infusions:  vancomycin Stopped (04/03/19 1007)     LOS: 3 days    Time spent: 30 minutes    Korin Setzler Darleen Crocker, DO [facility] Pager [phone]  If 7PM-7AM, please contact night-coverage [email or web address] Password [facility] 04/04/2019, 8:22 AM

## 2019-04-04 NOTE — Progress Notes (Signed)
SATURATION QUALIFICATIONS: (This note is used to comply with regulatory documentation for home oxygen)  Patient Saturations on Room Air at Rest = 96%  Patient Saturations on Room Air while Ambulating = 96%  Please briefly explain why patient needs home oxygen: patient does not require supplemental oxygen to maintain O2 sats above 90% while ambulating or at rest.

## 2019-04-05 LAB — CBC
HCT: 35.4 % — ABNORMAL LOW (ref 39.0–52.0)
Hemoglobin: 10.8 g/dL — ABNORMAL LOW (ref 13.0–17.0)
MCH: 32.1 pg (ref 26.0–34.0)
MCHC: 30.5 g/dL (ref 30.0–36.0)
MCV: 105.4 fL — ABNORMAL HIGH (ref 80.0–100.0)
Platelets: 302 10*3/uL (ref 150–400)
RBC: 3.36 MIL/uL — ABNORMAL LOW (ref 4.22–5.81)
RDW: 13.2 % (ref 11.5–15.5)
WBC: 12.6 10*3/uL — ABNORMAL HIGH (ref 4.0–10.5)
nRBC: 0 % (ref 0.0–0.2)

## 2019-04-05 LAB — GLUCOSE, CAPILLARY
Glucose-Capillary: 166 mg/dL — ABNORMAL HIGH (ref 70–99)
Glucose-Capillary: 193 mg/dL — ABNORMAL HIGH (ref 70–99)
Glucose-Capillary: 216 mg/dL — ABNORMAL HIGH (ref 70–99)
Glucose-Capillary: 142 mg/dL — ABNORMAL HIGH (ref 70–99)

## 2019-04-05 MED ORDER — INSULIN ASPART 100 UNIT/ML ~~LOC~~ SOLN
7.0000 [IU] | Freq: Three times a day (TID) | SUBCUTANEOUS | Status: DC
  Administered 2019-04-05 – 2019-04-06 (×4): 7 [IU] via SUBCUTANEOUS

## 2019-04-05 NOTE — Evaluation (Signed)
Occupational Therapy Evaluation Patient Details Name: [name] MRN: [id number] DOB: 10/31/1946 Today's Date: 04/05/2019    History of Present Illness Patient was admitted for sepsis due to right hand cellulitis and was initially placed on Rocephin and clindamycin which transitioned to vancomycin on 4/10.  He appears to be having some improvement but continues to have persistent left shoulder pain.   Clinical Impression   Patient in bed upon therapy arrival and attempting to complete self feeding during breakfast. Patient reports that he initially came to the hospital for right hand pain and swelling. He was using his left UE to assist with all activities due to Right hand pain and now has since developed 9/10 left UE pain starting from his shoulder and shooting down into his hand. He is unable to use his LUE for any daily tasks and has no active movement. Increased pain verbalized and witnessed with any touch or movement from therapist. Patient is able to utilize his right hand somewhat to assist with self feeding although no active movement is demonstrated in his right shoulder. During evaluation, patient was educated on proper positioning of BUEs when eating meals to maximize his performance as well as assist with fluid drainage. At this time, patient is unable to return home as he lives alone and cannot utilize his BUEs for any daily tasks. Recommend SNF at discharge. Patient will benefit from skilled OT services to focus on mentioned deficits and increase his ability to utilize his BUEs when completing daily tasks.     Follow Up Recommendations  SNF    Equipment Recommendations  3 in 1 bedside commode;Tub/shower seat       Precautions / Restrictions Precautions Precautions: None Restrictions Weight Bearing Restrictions: No             ADL either performed or assessed with clinical judgement   ADL Overall ADL's : Needs assistance/impaired Eating/Feeding: Minimal  assistance;Bed level;Sitting             General ADL Comments: Unable to assess functional performance during evaluation with ADL tasks as patient just received his breakfast tray. Based on decreased functional use of BUEs, patient will require Total assistance for basic ADL tasks.      Vision Baseline Vision/History: Wears glasses Patient Visual Report: No change from baseline Vision Assessment?: No apparent visual deficits            Pertinent Vitals/Pain Pain Assessment: 0-10 Pain Score: 9  Pain Location: left arm Pain Descriptors / Indicators: Aching;Sharp;Grimacing;Tender Pain Intervention(s): Limited activity within patient's tolerance;Monitored during session;Repositioned;Ice applied     Hand Dominance Right   Extremity/Trunk Assessment Upper Extremity Assessment Upper Extremity Assessment: Generalized weakness;RUE deficits/detail;LUE deficits/detail RUE Deficits / Details: Shoulder flexion/abduction: 1/5 MMT. Unable to complete any active ROM movement. patient able to tolerate shoulder flexion/abduction passively to 50% range. Able to complete full active elbow flexion/extension; 3/5 MMT. Less than full range for active wrist flexion/extension; 3-/5 MMT. 75% range for composite digit flexion and extension.  RUE Sensation: (N/A) RUE Coordination: decreased fine motor;decreased gross motor LUE Deficits / Details: Patient with left thumb amputation. Patient is unable to display any active movement in his left UE. Slight finger wriggle present. Patient able to tolerate passive shoulder flexion to 25% range. Passive elbow flexion/extension is WFL. 50% wrist flexion/extension passive. 50% passive composite flexion and extension of digits. Due to high level of pain with passive movement and touch, assessment of LUE was difficult.  LUE: Unable to fully assess due to  pain LUE Sensation: (N/A) LUE Coordination: decreased fine motor;decreased gross motor   Lower Extremity  Assessment Lower Extremity Assessment: Defer to PT evaluation       Communication Communication Communication: No difficulties   Cognition Arousal/Alertness: Awake/alert Behavior During Therapy: WFL for tasks assessed/performed Overall Cognitive Status: Within Functional Limits for tasks assessed            Exercises Other Exercises Other Exercises: Patient educated on proper positioning when supine and seated in bed with handout provided. Patient was provided with 2 pillows for each extremity and BUEs were placed on pillows to elevate.  Other Exercises: Patient was provided with handout regarding how to decrease extremity swelling. Discussed elevation, ice, and active movement. Patient verbalized understanding.   Shoulder Instructions      Home Living Family/patient expects to be discharged to:: Skilled nursing facility Living Arrangements: Alone   Type of Home: House Home Access: Stairs to enter Entrance Stairs-Number of Steps: 3 Entrance Stairs-Rails: Can reach both Home Layout: One level     Bathroom Shower/Tub: Occupational psychologist: Handicapped height Bathroom Accessibility: Yes   Home Equipment: Environmental consultant - 2 wheels;Cane - single point   Additional Comments: Pt reports that nieces and nephews live close and can assist some.       Prior Functioning/Environment Level of Independence: Independent with assistive device(s)        Comments: Pt reports that he using the walker if his gout flares up, otherwise he uses the cane as neede.         OT Problem List: Decreased strength;Pain;Increased edema;Decreased range of motion;Decreased activity tolerance;Decreased coordination;Decreased knowledge of use of DME or AE;Impaired UE functional use      OT Treatment/Interventions: Self-care/ADL training;Manual therapy;Patient/family education;Modalities;Therapeutic exercise;Neuromuscular education;Splinting;Therapeutic activities;DME and/or AE instruction     OT Goals(Current goals can be found in the care plan section) Acute Rehab OT Goals Patient Stated Goal: to go to SNF OT Goal Formulation: With patient Time For Goal Achievement: 04/19/19 Potential to Achieve Goals: Good  OT Frequency: Min 2X/week   Barriers to D/C: Decreased caregiver support             AM-PAC OT "6 Clicks" Daily Activity     Outcome Measure Help from another person eating meals?: A Lot Help from another person taking care of personal grooming?: Total Help from another person toileting, which includes using toliet, bedpan, or urinal?: Total Help from another person bathing (including washing, rinsing, drying)?: Total Help from another person to put on and taking off regular upper body clothing?: Total Help from another person to put on and taking off regular lower body clothing?: Total 6 Click Score: 7   End of Session    Activity Tolerance: Patient limited by pain Patient left: in bed;with call bell/phone within reach  OT Visit Diagnosis: Muscle weakness (generalized) (M62.81)                Time: 0045-9977 OT Time Calculation (min): 25 min Charges:  OT General Charges $OT Visit: 1 Visit OT Evaluation $OT Eval High Complexity: 1 High OT Treatments $Self Care/Home Management : 8-22 mins(12[place], OTR/L,CBIS  [phone]   Yessenia Maillet, Clarene Duke 04/05/2019, 10:16 AM

## 2019-04-05 NOTE — Care Management Important Message (Signed)
Important Message  Patient Details  Name: [name] MRN: [id number] Date of Birth: 02-24-1946   Medicare Important Message Given:  Yes    Tommy Medal 04/05/2019, 3:47 PM

## 2019-04-05 NOTE — NC FL2 (Signed)
[place] LEVEL OF CARE SCREENING TOOL     IDENTIFICATION  Patient Name: [name] Birthdate: June 01, 1946 Sex: male Admission Date (Current Location): 04/01/2019  [facility] and [place] Number:  [employer] and Address:  [facility] [address] [place]      Provider Number: [id number]  Attending Physician Name and Address:  Rodena Goldmann, DO  Relative Name and Phone Number:       Current Level of Care: Hospital Recommended Level of Care: [place] Prior Approval Number:    Date Approved/Denied:   PASRR Number: [id number]  Discharge Plan: SNF    Current Diagnoses: Patient Active Problem List   Diagnosis Date Noted  . Sepsis ([place]) 04/02/2019  . Leukocytosis 04/02/2019  . Cellulitis of right hand 04/01/2019  . Peripheral edema   . CHF (congestive heart failure) ([place]) 07/05/2015  . Acute congestive heart failure ([place]) 07/04/2015  . Acute on chronic diastolic congestive heart failure ([place]) 07/04/2015  . HLD (hyperlipidemia) 07/04/2015  . Anxiety state 07/04/2015  . GERD (gastroesophageal reflux disease) 07/04/2015  . Heart block 07/04/2015  . Edema 03/11/2015  . Diastolic CHF, chronic ([place]) 03/11/2015  . 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 Muscle weakness (generalized) 01/13/2014  . Knee stiffness 01/13/2014  . Gout 12/11/2013  . Cellulitis 11/19/2013  . Expected blood loss anemia 11/17/2013  . Obese 11/17/2013  . S/P right TKA 11/15/2013  . Anemia, normocytic normochromic 01/12/2013  . Diabetes mellitus type 2 in obese ([place]) 12/10/2012  . CAD S/P percutaneous coronary angioplasty 11/24/2012  . Overweight(278.02) 11/17/2012  . Obstructive sleep apnea 11/15/2012  . Chronic kidney disease, stage 3, cardiorenal syndrome 08/23/2011  . Essential hypertension     Orientation RESPIRATION BLADDER Height & Weight     Self, Time, Situation, Place  Normal External catheter Weight: 131.5 kg Height:  6' 2"   (188 cm)  BEHAVIORAL SYMPTOMS/MOOD NEUROLOGICAL BOWEL NUTRITION STATUS      Continent Diet(carb modified)  AMBULATORY STATUS COMMUNICATION OF NEEDS Skin   Extensive Assist Verbally Normal                       Personal Care Assistance Level of Assistance  Bathing, Feeding, Dressing Bathing Assistance: Limited assistance Feeding assistance: Independent Dressing Assistance: Limited assistance     Functional Limitations Info  Sight, Hearing, Speech Sight Info: Adequate Hearing Info: Adequate Speech Info: Adequate    SPECIAL CARE FACTORS FREQUENCY  PT (By licensed PT), OT (By licensed OT)     PT Frequency: 5x/week OT Frequency: 3x/week            Contractures Contractures Info: Not present    Additional Factors Info  Code Status, Allergies, Psychotropic, Insulin Sliding Scale Code Status Info: Full code Allergies Info: Niacin And Related Psychotropic Info: Ativan Insulin Sliding Scale Info: see DC summary       Current Medications (04/05/2019):  This is the current hospital active medication list Current Facility-Administered Medications  Medication Dose Route Frequency Provider Last Rate Last Dose  . acetaminophen (TYLENOL) tablet 650 mg  650 mg Oral Q6H PRN Howerter, Justin B, DO       Or  . acetaminophen (TYLENOL) suppository 650 mg  650 mg Rectal Q6H PRN Howerter, Justin B, DO      . aspirin EC tablet 81 mg  81 mg Oral q morning - 10a Howerter, Justin B, DO   81 mg at 04/05/19 0803  .  calcitRIOL (ROCALTROL) capsule 0.25 mcg  0.25 mcg Oral Daily Howerter, Justin B, DO   0.25 mcg at 04/05/19 0803  . cholecalciferol (VITAMIN D3) tablet 1,000 Units  1,000 Units Oral Daily Howerter, Justin B, DO   1,000 Units at 04/05/19 0803  . ferrous sulfate tablet 325 mg  325 mg Oral Q breakfast Howerter, Justin B, DO   325 mg at 04/05/19 0803  . gabapentin (NEURONTIN) capsule 100 mg  100 mg Oral TID Manuella Ghazi, Pratik D, DO   100 mg at 04/05/19 0804  . HYDROcodone-acetaminophen  (NORCO) 10-325 MG per tablet 1 tablet  1 tablet Oral Q6H PRN Howerter, Justin B, DO   1 tablet at 04/05/19 0557  . ibuprofen (ADVIL,MOTRIN) tablet 800 mg  800 mg Oral TID Heath Lark D, DO   800 mg at 04/05/19 7473  . insulin aspart (novoLOG) injection 0-20 Units  0-20 Units Subcutaneous TID WC Shah, Pratik D, DO   4 Units at 04/05/19 1138  . insulin aspart (novoLOG) injection 0-5 Units  0-5 Units Subcutaneous QHS Heath Lark D, DO   2 Units at 04/02/19 2254  . insulin aspart (novoLOG) injection 7 Units  7 Units Subcutaneous TID WC Shah, Pratik D, DO   7 Units at 04/05/19 1138  . lidocaine (LIDODERM) 5 % 1 patch  1 patch Transdermal Q24H Manuella Ghazi, Pratik D, DO   1 patch at 04/05/19 0804  . ondansetron (ZOFRAN) tablet 4 mg  4 mg Oral Q6H PRN Howerter, Justin B, DO       Or  . ondansetron (ZOFRAN) injection 4 mg  4 mg Intravenous Q6H PRN Howerter, Justin B, DO      . polyethylene glycol (MIRALAX / GLYCOLAX) packet 17 g  17 g Oral Daily Manuella Ghazi, Pratik D, DO   17 g at 04/05/19 0803  . vancomycin (VANCOCIN) 1,500 mg in sodium chloride 0.9 % 500 mL IVPB  1,500 mg Intravenous Q24H Heath Lark D, DO 250 mL/hr at 04/05/19 0813 1,500 mg at 04/05/19 0813     Discharge Medications: Please see discharge summary for a list of discharge medications.  Relevant Imaging Results:  Relevant Lab Results:   Additional Information SSN [place]  Fantashia Shupert, Chauncey Reading, RN

## 2019-04-05 NOTE — Evaluation (Signed)
Physical Therapy Evaluation Patient Details Name: [name] MRN: [id number] DOB: 11/26/1946 Today's Date: 04/05/2019   History of Present Illness  Patient was admitted for sepsis due to right hand cellulitis and was initially placed on Rocephin and clindamycin which transitioned to vancomycin on 4/10.  He appears to be having some improvement but continues to have persistent left shoulder pain.    Clinical Impression  Patient limited for functional mobility as stated below secondary to generalized weakness, fatigue and fair/poor standing balance.  Patient required much time for sitting up at bedside, unable to use LUE due to pain and tolerated sitting up in chair after therapy.  Patient will benefit from continued physical therapy in hospital and recommended venue below to increase strength, balance, endurance for safe ADLs and gait.    Follow Up Recommendations SNF    Equipment Recommendations  None recommended by PT    Recommendations for Other Services       Precautions / Restrictions Precautions Precautions: Fall Restrictions Weight Bearing Restrictions: No      Mobility  Bed Mobility Overal bed mobility: Needs Assistance Bed Mobility: Supine to Sit     Supine to sit: Mod assist;Max assist     General bed mobility comments: increased time, labored movement, unable to use LUE   Transfers Overall transfer level: Needs assistance Equipment used: Quad cane Transfers: Sit to/from [facility] Sit to Stand: Min assist;Mod assist Stand pivot transfers: Min assist       General transfer comment: required bed raised in order to sit to stand using quad-cane  Ambulation/Gait Ambulation/Gait assistance: Min assist Gait Distance (Feet): 15 Feet Assistive device: Quad cane Gait Pattern/deviations: Decreased step length - right;Decreased step length - left;Decreased stride length Gait velocity: decreased   General Gait Details: limited to ambulation  in room due to c/o fatigue and fearful of falling  Stairs            Wheelchair Mobility    Modified Rankin (Stroke Patients Only)       Balance Overall balance assessment: Needs assistance Sitting-balance support: Feet supported Sitting balance-Leahy Scale: Fair     Standing balance support: Single extremity supported;During functional activity Standing balance-Leahy Scale: Fair Standing balance comment: using Quad cane                             Pertinent Vitals/Pain Pain Assessment: 0-10 Pain Score: 5  Pain Location: LUE Pain Descriptors / Indicators: Aching;Sharp;Grimacing;Tender;Guarding Pain Intervention(s): Limited activity within patient's tolerance;Monitored during session    [place] expects to be discharged to:: Private residence Living Arrangements: Alone Available Help at Discharge: Family Type of Home: House Home Access: Stairs to enter Entrance Stairs-Rails: Right;Left;Can reach both Entrance Stairs-Number of Steps: 2-3 Home Layout: One [place]: Environmental consultant - 2 wheels;Cane - single point;Toilet riser Additional Comments: Pt reports that nieces and nephews live close and can assist some.     Prior Function Level of Independence: Independent         Comments: Hydrographic surveyor, drives     Hand Dominance   Dominant Hand: Right    Extremity/Trunk Assessment   Upper Extremity Assessment Upper Extremity Assessment: Defer to OT evaluation RUE Deficits / Details: Shoulder flexion/abduction: 1/5 MMT. Unable to complete any active ROM movement. patient able to tolerate shoulder flexion/abduction passively to 50% range. Able to complete full active elbow flexion/extension; 3/5 MMT. Less than full range for active wrist flexion/extension; 3-/5  MMT. 75% range for composite digit flexion and extension.  RUE Sensation: (N/A) RUE Coordination: decreased fine motor;decreased gross motor LUE Deficits / Details:  Patient with left thumb amputation. Patient is unable to display any active movement in his left UE. Slight finger wriggle present. Patient able to tolerate passive shoulder flexion to 25% range. Passive elbow flexion/extension is WFL. 50% wrist flexion/extension passive. 50% passive composite flexion and extension of digits. Due to high level of pain with passive movement and touch, assessment of LUE was difficult.  LUE: Unable to fully assess due to pain LUE Sensation: (N/A) LUE Coordination: decreased fine motor;decreased gross motor    Lower Extremity Assessment Lower Extremity Assessment: Generalized weakness    Cervical / Trunk Assessment Cervical / Trunk Assessment: Normal  Communication   Communication: No difficulties  Cognition Arousal/Alertness: Awake/alert Behavior During Therapy: WFL for tasks assessed/performed Overall Cognitive Status: Within Functional Limits for tasks assessed                                        General Comments      Exercises Other Exercises Other Exercises: Patient educated on proper positioning when supine and seated in bed with handout provided. Patient was provided with 2 pillows for each extremity and BUEs were placed on pillows to elevate.  Other Exercises: Patient was provided with handout regarding how to decrease extremity swelling. Discussed elevation, ice, and active movement. Patient verbalized understanding.   Assessment/Plan    PT Assessment Patient needs continued PT services  PT Problem List Decreased strength;Decreased activity tolerance;Decreased balance;Decreased mobility;Decreased range of motion;Pain(decreased ROM RUE due to pain)       PT Treatment Interventions Therapeutic exercise;Gait training;Stair training;Functional mobility training;Therapeutic activities;Patient/family education    PT Goals (Current goals can be found in the Care Plan section)  Acute Rehab PT Goals Patient Stated Goal: return  home    Frequency Min 3X/week   Barriers to discharge        Co-evaluation               AM-PAC PT "6 Clicks" Mobility  Outcome Measure Help needed turning from your back to your side while in a flat bed without using bedrails?: A Lot Help needed moving from lying on your back to sitting on the side of a flat bed without using bedrails?: A Lot Help needed moving to and from a bed to a chair (including a wheelchair)?: A Little Help needed standing up from a chair using your arms (e.g., wheelchair or bedside chair)?: A Lot Help needed to walk in hospital room?: A Little Help needed climbing 3-5 steps with a railing? : A Lot 6 Click Score: 14    End of Session Equipment Utilized During Treatment: Gait belt Activity Tolerance: Patient tolerated treatment well;Patient limited by fatigue Patient left: in chair;with call bell/phone within reach Nurse Communication: Mobility status PT Visit Diagnosis: Unsteadiness on feet (R26.81);Other abnormalities of gait and mobility (R26.89);Muscle weakness (generalized) (M62.81)    Time: 7096-2836 PT Time Calculation (min) (ACUTE ONLY): 33 min   Charges:   PT Evaluation $PT Eval Moderate Complexity: 1 Mod PT Treatments $Therapeutic Activity: 23-37 mins        12:29 PM, 04/05/19 [name], MPT Physical Therapist with [facility] [phone] office [phone] mobile phone

## 2019-04-05 NOTE — Progress Notes (Addendum)
Inpatient Diabetes Program Recommendations  AACE/ADA: New Consensus Statement on Inpatient Glycemic Control   Target Ranges:  Prepandial:   less than 140 mg/dL      Peak postprandial:   less than 180 mg/dL (1-2 hours)      Critically ill patients:  140 - 180 mg/dL   Results for [name], [name] (MRN [id number]) as of 04/05/2019 07:55  Ref. Range 04/04/2019 07:25 04/04/2019 11:14 04/04/2019 16:16 04/04/2019 21:36 04/05/2019 07:44  Glucose-Capillary Latest Ref Range: 70 - 99 mg/dL 175 (H) 224 (H) 276 (H) 170 (H) 166 (H)  Results for [name], [name] (MRN [id number]) as of 04/05/2019 07:55  Ref. Range 04/02/2019 05:57  Hemoglobin A1C Latest Ref Range: 4.8 - 5.6 % 8.4 (H)   Review of Glycemic Control  Diabetes history: DM2 Outpatient Diabetes medications: Acarbose 50 mg TID with meals Current orders for Inpatient glycemic control: Novolog 0-20 units TID with meals, Novolog 0-5 units QHS, Novolog 4 units TID with meals  Inpatient Diabetes Program Recommendations:  Insulin - Meal Coverage: Please consider increasing meal coverage to Novolog 7 units TID with meals for meal coverage if patient eats at least 50% of meals. HgbA1C: A1C 8.4% on 04/02/19 indicating an average glucose of 194 mg/dl over the past 2-3 months.   Thanks, Barnie Alderman, RN, MSN, CDE Diabetes Coordinator Inpatient Diabetes Program [phone] (Team Pager from 8am to 5pm)

## 2019-04-05 NOTE — Progress Notes (Signed)
PROGRESS NOTE    [name]  RXV:[id number] DOB: November 30, 1946 DOA: 04/01/2019 PCP: Redmond School, MD   Brief Narrative:  Per HPI: [name] a 73 y.o.malewith medical history significant forcoronary artery disease status post drug-eluting stent placement x2 in 5093, chronic diastolic heart failure, hypertension, type 2 diabetes mellitus, stage III/IV chronic kidney disease with baseline creatinine 2.2-2.4,who is admitted [facility] 04/01/2019 with sepsis due to right hand cellulitis after presenting from home to theAP [place] of right hand pain.  The patient reports 3 to 4 days of right hand discomfort associated with erythema, increased warmth, and swellingabout the dorsal aspect of the right hand.Denies any preceding trauma involving the right hand, and denies any preceding animal bites to the RUE.He reports 1 day of subjective fever in the absence of chills or rigors. Denies rash in any other location,and denies extension of the erythema into the fingersof the right hand. Within 1 day of onset of theaforementioned symptoms, the patient reports that he presented to his PCP for evaluation of such. He reports that he was diagnosed with right hand cellulitis at that time, and prescribed doxycycline.The patient reports that he felt nauseous after taking his first dose of doxycycline, prompting him to discontinue the rest of the prescribed regimen. Subsequent to the single dose of doxycycline, the patient reports that he has had no additional antibiotics leading up to presentation to the emergency department today. He denies any associated pallor,numbness,or paresthesias involving the right hand, and he also denies any right hand discharge.  Patient was admitted for sepsis due to right hand cellulitis and was initially placed on Rocephin and clindamycin whichtransitioned to vancomycin on 4/10. He appears to be having some improvement but continues to  have persistent left shoulder pain.  Assessment & Plan:   Principal Problem:   Cellulitis of right hand Active Problems:   Essential hypertension   Chronic kidney disease, stage 3, cardiorenal syndrome   Diabetes mellitus type 2 in obese (HCC)   Diastolic CHF, chronic (HCC)   Sepsis (HCC)   Leukocytosis   1. Sepsis secondary to right hand cellulitis-slowly improving. Continue on IV vancomycin for now and monitor blood cultureswith no growth noted in the last 3 days.Continue to monitor on repeat CBC.  Leukocytosis is finally improving, but he continues to have significant restrictions in mobility of his right hand after OT evaluation.  We will continue IV vancomycin at this time until discharge plans are finalized SNF/rehab. 2. Chronic diastolic heart failure. Monitor daily weights and resume Lasix with repeat BMP in a.m. Currently with minimal positive fluid balance noted.  Weights currently remained stable. 3. Hypertension-controlled. Resume home Norvasc as well as Lasix due to elevated blood pressures. 4. Type 2 diabetes. Recent hemoglobin A1c controlled at 5.6%.  Continue current sliding scale with blood glucose reading in the low 200 range.  3 times daily dosing of insulin increased to 7 units. 5. Stage III-4 chronic kidney disease. Currently at baseline creatinine levels. Repeat a.m. labs.   DVT prophylaxis:SCDs Code Status:Full Family Communication:None Disposition Plan:Continue on IV vancomycin and reassess right lower extremity swelling and pain.OT evaluation revealing need for SNF. Ibuprofen and Lidoderm patch also added on 4/11.    Gabapentin noted on 4/12.   Consultants:  None  Procedures:  None  Antimicrobials:  Rocephin and clindamycin 4/9-4/10  Vancomycin4/10->  Subjective: Patient seen and evaluated today with no new acute complaints or concerns. No acute concerns or events noted overnight. His right hand is slowly getting  better and he is able to move it more with less swelling and pain.  His left shoulder is less painful as well.  Objective: Vitals:   04/04/19 1942 04/04/19 2140 04/05/19 0500 04/05/19 0532  BP:  140/74  130/64  Pulse:  79  77  Resp:  20  20  Temp:  98.4 F (36.9 C)  97.8 F (36.6 C)  TempSrc:  Oral  Oral  SpO2: 98% 98%  93%  Weight:   131.5 kg   Height:        Intake/Output Summary (Last 24 hours) at 04/05/2019 1229 Last data filed at 04/05/2019 0900 Gross per 24 hour  Intake 960 ml  Output 975 ml  Net -15 ml   Filed Weights   04/03/19 0500 04/04/19 0514 04/05/19 0500  Weight: 135.5 kg 131.8 kg 131.5 kg    Examination:  General exam: Appears calm and comfortable  Respiratory system: Clear to auscultation. Respiratory effort normal. Cardiovascular system: S1 & S2 heard, RRR. No JVD, murmurs, rubs, gallops or clicks. No pedal edema. Gastrointestinal system: Abdomen is nondistended, soft and nontender. No organomegaly or masses felt. Normal bowel sounds heard. Central nervous system: Alert and oriented. No focal neurological deficits. Extremities: Symmetric 5 x 5 power.  Right hand swelling improved with better mobility. Skin: No rashes, lesions or ulcers Psychiatry: Judgement and insight appear normal. Mood & affect appropriate.     Data Reviewed: I have personally reviewed following labs and imaging studies  CBC: Recent Labs  Lab 04/01/19 1611 04/02/19 0557 04/03/19 0650 04/04/19 0617 04/05/19 0550  WBC 13.3* 11.9* 13.2* 14.1* 12.6*  NEUTROABS 10.8* 9.3*  --   --   --   HGB 12.0* 11.2* 10.6* 11.8* 10.8*  HCT 37.0* 35.5* 33.8* 37.3* 35.4*  MCV 100.5* 102.9* 103.0* 103.3* 105.4*  PLT 222 241 255 279 202   Basic Metabolic Panel: Recent Labs  Lab 04/01/19 1611 04/02/19 0557 04/03/19 0650 04/04/19 0617  NA 139 142 143 144  K 4.0 3.9 4.1 4.3  CL 102 107 108 110  CO2 22 21* 22 21*  GLUCOSE 319* 251* 233* 208*  BUN 69* 61* 51* 54*  CREATININE 2.46* 2.17*  2.11* 2.06*  CALCIUM 9.3 9.3 9.2 9.4  MG  --  1.9  --   --    GFR: Estimated Creatinine Clearance: 46 mL/min (A) (by C-G formula based on SCr of 2.06 mg/dL (H)). Liver Function Tests: Recent Labs  Lab 04/01/19 1611  AST 22  ALT 19  ALKPHOS 57  BILITOT 0.8  PROT 8.4*  ALBUMIN 3.3*   No results for input(s): LIPASE, AMYLASE in the last 168 hours. No results for input(s): AMMONIA in the last 168 hours. Coagulation Profile: No results for input(s): INR, PROTIME in the last 168 hours. Cardiac Enzymes: No results for input(s): CKTOTAL, CKMB, CKMBINDEX, TROPONINI in the last 168 hours. BNP (last 3 results) No results for input(s): PROBNP in the last 8760 hours. HbA1C: No results for input(s): HGBA1C in the last 72 hours. CBG: Recent Labs  Lab 04/04/19 1114 04/04/19 1616 04/04/19 2136 04/05/19 0744 04/05/19 1125  GLUCAP 224* 276* 170* 166* 193*   Lipid Profile: No results for input(s): CHOL, HDL, LDLCALC, TRIG, CHOLHDL, LDLDIRECT in the last 72 hours. Thyroid Function Tests: No results for input(s): TSH, T4TOTAL, FREET4, T3FREE, THYROIDAB in the last 72 hours. Anemia Panel: No results for input(s): VITAMINB12, FOLATE, FERRITIN, TIBC, IRON, RETICCTPCT in the last 72 hours. Sepsis Labs: Recent Labs  Lab 04/01/19 1611  LATICACIDVEN 1.6    Recent Results (from the past 240 hour(s))  Culture, blood (routine x 2)     Status: None (Preliminary result)   Collection Time: 04/01/19  9:05 PM  Result Value Ref Range Status   Specimen Description BLOOD RIGHT ANTECUBITAL  Final   Special Requests   Final    BOTTLES DRAWN AEROBIC AND ANAEROBIC Blood Culture adequate volume   Culture   Final    NO GROWTH 4 DAYS Performed at [facility], [address] [place], [place] [address]    Report Status PENDING  Incomplete  Culture, blood (routine x 2)     Status: None (Preliminary result)   Collection Time: 04/01/19  9:10 PM  Result Value Ref Range Status   Specimen Description  BLOOD RIGHT HAND  Final   Special Requests   Final    AEROBIC BOTTLE ONLY Blood Culture results may not be optimal due to an inadequate volume of blood received in culture bottles   Culture   Final    NO GROWTH 4 DAYS Performed at [facility], [address], [place], [place] [address]    Report Status PENDING  Incomplete         Radiology Studies: No results found.      Scheduled Meds: . aspirin EC  81 mg Oral q morning - 10a  . calcitRIOL  0.25 mcg Oral Daily  . cholecalciferol  1,000 Units Oral Daily  . ferrous sulfate  325 mg Oral Q breakfast  . gabapentin  100 mg Oral TID  . ibuprofen  800 mg Oral TID  . insulin aspart  0-20 Units Subcutaneous TID WC  . insulin aspart  0-5 Units Subcutaneous QHS  . insulin aspart  7 Units Subcutaneous TID WC  . lidocaine  1 patch Transdermal Q24H  . polyethylene glycol  17 g Oral Daily   Continuous Infusions: . vancomycin 1,500 mg (04/05/19 0813)     LOS: 4 days    Time spent: 30 minutes    Mana Haberl Darleen Crocker, DO [facility] Pager [phone]  If 7PM-7AM, please contact night-coverage [email or web address] Password [facility] 04/05/2019, 12:29 PM

## 2019-04-05 NOTE — Plan of Care (Signed)
  Problem: Acute Rehab OT Goals (only OT should resolve) Goal: Pt. Will Perform Eating Flowsheets (Taken 04/05/2019 1022) Pt Will Perform Eating: with modified independence; sitting; with adaptive utensils Goal: Pt. Will Perform Grooming Flowsheets (Taken 04/05/2019 1022) Pt Will Perform Grooming: sitting; with min assist Goal: Pt. Will Perform Upper Body Bathing Flowsheets (Taken 04/05/2019 1022) Pt Will Perform Upper Body Bathing: with mod assist; sitting Goal: Pt. Will Perform Upper Body Dressing Flowsheets (Taken 04/05/2019 1022) Pt Will Perform Upper Body Dressing: with mod assist; sitting Goal: Pt. Will Transfer To Toilet Flowsheets (Taken 04/05/2019 1022) Pt Will Transfer to Toilet: with mod assist; bedside commode Goal: Pt. Will Perform Toileting-Clothing Manipulation Flowsheets (Taken 04/05/2019 1022) Pt Will Perform Toileting - Clothing Manipulation and hygiene: with mod assist; sitting/lateral leans; sit to/from stand Goal: Pt/Caregiver Will Perform Home Exercise Program Flowsheets (Taken 04/05/2019 1022) Pt/caregiver will Perform Home Exercise Program: Increased ROM; Increased strength; Both right and left upper extremity; With minimal assist; With written HEP provided

## 2019-04-05 NOTE — Plan of Care (Signed)
  Problem: Acute Rehab PT Goals(only PT should resolve) Goal: Pt Will Go Supine/Side To Sit Outcome: Progressing Flowsheets (Taken 04/05/2019 1231) Pt will go Supine/Side to Sit: with minimal assist Goal: Patient Will Transfer Sit To/From Stand Outcome: Progressing Flowsheets (Taken 04/05/2019 1231) Patient will transfer sit to/from stand: with minimal assist Goal: Pt Will Transfer Bed To Chair/Chair To Bed Outcome: Progressing Flowsheets (Taken 04/05/2019 1231) Pt will Transfer Bed to Chair/Chair to Bed: min guard assist Goal: Pt Will Ambulate Outcome: Progressing Flowsheets (Taken 04/05/2019 1231) Pt will Ambulate: 50 feet; with min guard assist; with cane; with rolling walker   12:32 PM, 04/05/19 [name], MPT Physical Therapist with [facility] [phone] office [phone] mobile phone

## 2019-04-05 NOTE — Progress Notes (Signed)
Pharmacy Antibiotic Note  [name] is a 73 y.o. male admitted on 04/01/2019 with cellulitis.  Pharmacy has been consulted for vancomycin dosing. Afebrile,  WBC improving. Shoulder pain also(xray osteoarthosis no acute osseous abnormality). Slow improvement.   Plan: Continue Vancomycin 1590m IV every 24 hours.  Goal trough 10-15 mcg/mL. VT with AM dose 4/14 F/u cxs and clinical progress Monitor V/S and labs  Height: 6' 2"  (188 cm) Weight: 289 lb 14.1 oz (131.5 kg) IBW/kg (Calculated) : 82.2  Temp (24hrs), Avg:98.2 F (36.8 C), Min:97.8 F (36.6 C), Max:98.4 F (36.9 C)  Recent Labs  Lab 04/01/19 1611 04/02/19 0557 04/03/19 0650 04/04/19 0617 04/05/19 0550  WBC 13.3* 11.9* 13.2* 14.1* 12.6*  CREATININE 2.46* 2.17* 2.11* 2.06*  --   LATICACIDVEN 1.6  --   --   --   --     Estimated Creatinine Clearance: 46 mL/min (A) (by C-G formula based on SCr of 2.06 mg/dL (H)).    Allergies  Allergen Reactions  . Niacin And Related Other (See Comments)    Reaction: flushing and burning side effects even with extended release    Antimicrobials this admission: 4/10 vancomycin >>  4/10 ceftriaxone x 1   Microbiology results: 4/10 BCx: ngtd  Thank you for allowing pharmacy to be a part of this patient's care.  LIsac Sarna BS Pharm D, BCaliforniaClinical Pharmacist Pager #[phone]/13/2020 10:29 AM

## 2019-04-05 NOTE — TOC Progression Note (Addendum)
Transition of Care [facility]) - Progression Note    Patient Details  Name: [name] MRN: [id number] Date of Birth: 1946-06-03  [facility] of Care [facility] CM/SW Contact  Talal Fritchman, Chauncey Reading, RN Phone Number: 04/05/2019, 2:42 PM  Clinical Narrative:  See previous TOC note. Patient has been seen by PT and OT and recommended for SNF. Patient agreeable, wants referrals sent to [facility] and [facility] only at this time. Referrals sent.     Expected Discharge Plan: Home/Self Care Barriers to Discharge: No Barriers Identified  Expected Discharge Plan and Services Expected Discharge Plan: Home/Self Care   Discharge Planning Services: CM Consult             Readmission Risk Interventions No flowsheet data found.

## 2019-04-06 DIAGNOSIS — E1121 Type 2 diabetes mellitus with diabetic nephropathy: Secondary | ICD-10-CM | POA: Diagnosis not present

## 2019-04-06 DIAGNOSIS — L97421 Non-pressure chronic ulcer of left heel and midfoot limited to breakdown of skin: Secondary | ICD-10-CM | POA: Diagnosis not present

## 2019-04-06 DIAGNOSIS — L03115 Cellulitis of right lower limb: Secondary | ICD-10-CM | POA: Diagnosis not present

## 2019-04-06 DIAGNOSIS — E1122 Type 2 diabetes mellitus with diabetic chronic kidney disease: Secondary | ICD-10-CM | POA: Diagnosis not present

## 2019-04-06 DIAGNOSIS — Z23 Encounter for immunization: Secondary | ICD-10-CM | POA: Diagnosis not present

## 2019-04-06 DIAGNOSIS — L03113 Cellulitis of right upper limb: Secondary | ICD-10-CM | POA: Diagnosis not present

## 2019-04-06 DIAGNOSIS — M6281 Muscle weakness (generalized): Secondary | ICD-10-CM | POA: Diagnosis not present

## 2019-04-06 DIAGNOSIS — I509 Heart failure, unspecified: Secondary | ICD-10-CM | POA: Diagnosis not present

## 2019-04-06 DIAGNOSIS — D649 Anemia, unspecified: Secondary | ICD-10-CM | POA: Diagnosis not present

## 2019-04-06 DIAGNOSIS — N183 Chronic kidney disease, stage 3 (moderate): Secondary | ICD-10-CM | POA: Diagnosis not present

## 2019-04-06 DIAGNOSIS — R2689 Other abnormalities of gait and mobility: Secondary | ICD-10-CM | POA: Diagnosis not present

## 2019-04-06 DIAGNOSIS — M2550 Pain in unspecified joint: Secondary | ICD-10-CM | POA: Diagnosis not present

## 2019-04-06 DIAGNOSIS — R6889 Other general symptoms and signs: Secondary | ICD-10-CM | POA: Diagnosis not present

## 2019-04-06 DIAGNOSIS — I10 Essential (primary) hypertension: Secondary | ICD-10-CM | POA: Diagnosis not present

## 2019-04-06 DIAGNOSIS — I130 Hypertensive heart and chronic kidney disease with heart failure and stage 1 through stage 4 chronic kidney disease, or unspecified chronic kidney disease: Secondary | ICD-10-CM | POA: Diagnosis not present

## 2019-04-06 DIAGNOSIS — E782 Mixed hyperlipidemia: Secondary | ICD-10-CM | POA: Diagnosis not present

## 2019-04-06 DIAGNOSIS — E139 Other specified diabetes mellitus without complications: Secondary | ICD-10-CM | POA: Diagnosis not present

## 2019-04-06 DIAGNOSIS — I2510 Atherosclerotic heart disease of native coronary artery without angina pectoris: Secondary | ICD-10-CM | POA: Diagnosis not present

## 2019-04-06 DIAGNOSIS — I5030 Unspecified diastolic (congestive) heart failure: Secondary | ICD-10-CM | POA: Diagnosis not present

## 2019-04-06 DIAGNOSIS — A419 Sepsis, unspecified organism: Secondary | ICD-10-CM | POA: Diagnosis not present

## 2019-04-06 LAB — CBC
HCT: 40.4 % (ref 39.0–52.0)
Hemoglobin: 12.2 g/dL — ABNORMAL LOW (ref 13.0–17.0)
MCH: 32.1 pg (ref 26.0–34.0)
MCHC: 30.2 g/dL (ref 30.0–36.0)
MCV: 106.3 fL — ABNORMAL HIGH (ref 80.0–100.0)
Platelets: 305 10*3/uL (ref 150–400)
RBC: 3.8 MIL/uL — ABNORMAL LOW (ref 4.22–5.81)
RDW: 13.1 % (ref 11.5–15.5)
WBC: 10.9 10*3/uL — ABNORMAL HIGH (ref 4.0–10.5)
nRBC: 0 % (ref 0.0–0.2)

## 2019-04-06 LAB — CULTURE, BLOOD (ROUTINE X 2)
Culture: NO GROWTH
Culture: NO GROWTH
Special Requests: ADEQUATE

## 2019-04-06 LAB — BASIC METABOLIC PANEL
Anion gap: 12 (ref 5–15)
BUN: 56 mg/dL — ABNORMAL HIGH (ref 8–23)
CO2: 21 mmol/L — ABNORMAL LOW (ref 22–32)
Calcium: 9.4 mg/dL (ref 8.9–10.3)
Chloride: 110 mmol/L (ref 98–111)
Creatinine, Ser: 2.18 mg/dL — ABNORMAL HIGH (ref 0.61–1.24)
GFR calc Af Amer: 34 mL/min — ABNORMAL LOW (ref >60)
GFR calc non Af Amer: 29 mL/min — ABNORMAL LOW (ref >60)
Glucose, Bld: 151 mg/dL — ABNORMAL HIGH (ref 70–99)
Potassium: 4.7 mmol/L (ref 3.5–5.1)
Sodium: 143 mmol/L (ref 135–145)

## 2019-04-06 LAB — GLUCOSE, CAPILLARY
Glucose-Capillary: 157 mg/dL — ABNORMAL HIGH (ref 70–99)
Glucose-Capillary: 169 mg/dL — ABNORMAL HIGH (ref 70–99)

## 2019-04-06 MED ORDER — DOXYCYCLINE HYCLATE 100 MG PO TABS: 100.0000 mg | ORAL_TABLET | Freq: Two times a day (BID) | ORAL | 0 refills | Status: AC

## 2019-04-06 MED ORDER — HYDROCODONE-ACETAMINOPHEN 10-325 MG PO TABS: 1.0000 | ORAL_TABLET | Freq: Four times a day (QID) | ORAL | 0 refills | Status: DC | PRN

## 2019-04-06 MED ORDER — GABAPENTIN 100 MG PO CAPS: 100.0000 mg | ORAL_CAPSULE | Freq: Three times a day (TID) | ORAL | 0 refills | Status: DC

## 2019-04-06 MED ORDER — ASPIRIN EC 81 MG PO TBEC: 81.0000 mg | DELAYED_RELEASE_TABLET | Freq: Every day | ORAL | 0 refills | Status: AC

## 2019-04-06 MED ORDER — LIDOCAINE 5 % EX PTCH: 1.0000 | MEDICATED_PATCH | CUTANEOUS | 0 refills | Status: DC

## 2019-04-06 MED ORDER — POLYETHYLENE GLYCOL 3350 17 G PO PACK: 17.0000 g | PACK | Freq: Every day | ORAL | 0 refills | Status: DC

## 2019-04-06 MED ORDER — SITAGLIPTIN PHOSPHATE 25 MG PO TABS: 25.0000 mg | ORAL_TABLET | Freq: Every day | ORAL | 0 refills | Status: DC

## 2019-04-06 MED ORDER — DOXYCYCLINE HYCLATE 100 MG PO TABS
100.0000 mg | ORAL_TABLET | Freq: Two times a day (BID) | ORAL | Status: DC
  Administered 2019-04-06: 100 mg via ORAL
  Filled 2019-04-06: qty 1

## 2019-04-06 MED ORDER — LORAZEPAM 0.5 MG PO TABS: 0.5000 mg | ORAL_TABLET | Freq: Four times a day (QID) | ORAL | 0 refills | Status: AC | PRN

## 2019-04-06 NOTE — Progress Notes (Signed)
Physical Therapy Treatment Patient Details Name: [name] MRN: [id number] DOB: 08-04-1946 Today's Date: 04/06/2019    History of Present Illness Patient was admitted for sepsis due to right hand cellulitis and was initially placed on Rocephin and clindamycin which transitioned to vancomycin on 4/10.  He appears to be having some improvement but continues to have persistent left shoulder pain.    PT Comments    Co-tx with OT.  Pt supine in bed and willing to participate.  Pt limited by UE pain Lt>Rt, RN gave pain medication at beginning of session.  Mod A with bed mobility, labored movement due to weakness as well as limited by UE pain.  Pt required bed raised to assist with sit to stand.  Able to increased distance with gait, used QC for stability due to weakness and Lt UE pain, slow labored movements and slow cadence due to weakness.  Seated LE strengthening exercises complete for strengthening.  EOS pt left in chair with call bell within reach and chair alarm set.     RN aware of status.    Follow Up Recommendations  SNF     Equipment Recommendations  None recommended by PT    Recommendations for Other Services       Precautions / Restrictions Precautions Precautions: Fall Restrictions Weight Bearing Restrictions: No    Mobility  Bed Mobility Overal bed mobility: Needs Assistance Bed Mobility: Supine to Sit     Supine to sit: Mod assist     General bed mobility comments: increased time, labored movements, limited by BUE pain with task Lt UE>Rt  Transfers Overall transfer level: Needs assistance Equipment used: Quad cane Transfers: Sit to/from Stand Sit to Stand: Min assist         General transfer comment: elevated bed height to assist with standing  Ambulation/Gait Ambulation/Gait assistance: Min assist Gait Distance (Feet): 38 Feet Assistive device: Quad cane Gait Pattern/deviations: Decreased step length - right;Decreased step length - left;Decreased  stride length Gait velocity: decreased   General Gait Details: slow labored movements   Stairs             Wheelchair Mobility    Modified Rankin (Stroke Patients Only)       Balance                                            Cognition Arousal/Alertness: Awake/alert Behavior During Therapy: WFL for tasks assessed/performed Overall Cognitive Status: Within Functional Limits for tasks assessed                                        Exercises General Exercises - Upper Extremity Shoulder Flexion: AAROM;Both;10 reps;Seated Elbow Flexion: AAROM;Both;10 reps;Seated Elbow Extension: AAROM;Both;10 reps;Seated Digit Composite Flexion: AROM;Both;10 reps;Seated Composite Extension: AROM;10 reps;Seated General Exercises - Lower Extremity Ankle Circles/Pumps: Both;10 reps;Seated Long Arc Quad: Both;10 reps Hip Flexion/Marching: Both;10 reps;Seated Other Exercises Other Exercises: shoulder protraction, AA/ROM, BUE 10X Other Exercises: scapular elevation, BUE, A/ROM, 10X Other Exercises: scapular row/retraction, BUE, A/ROM 10X    General Comments        Pertinent Vitals/Pain Pain Assessment: 0-10 Pain Score: 9  Pain Location: LUE Pain Descriptors / Indicators: Aching;Sharp;Grimacing;Tender;Guarding Pain Intervention(s): Limited activity within patient's tolerance;Monitored during session;Repositioned;RN gave pain meds during session    Home  Living                      Prior Function            PT Goals (current goals can now be found in the care plan section) Acute Rehab PT Goals Patient Stated Goal: return home    Frequency    Min 3X/week      PT Plan Current plan remains appropriate    Co-evaluation PT/OT/SLP Co-Evaluation/Treatment: Yes Reason for Co-Treatment: Complexity of the patient's impairments (multi-system involvement) PT goals addressed during session: Mobility/safety with  mobility;Strengthening/ROM;Balance OT goals addressed during session: Strengthening/ROM      AM-PAC PT "6 Clicks" Mobility   Outcome Measure  Help needed turning from your back to your side while in a flat bed without using bedrails?: A Lot Help needed moving from lying on your back to sitting on the side of a flat bed without using bedrails?: A Lot Help needed moving to and from a bed to a chair (including a wheelchair)?: A Little Help needed standing up from a chair using your arms (e.g., wheelchair or bedside chair)?: A Lot Help needed to walk in hospital room?: A Little Help needed climbing 3-5 steps with a railing? : A Lot 6 Click Score: 14    End of Session Equipment Utilized During Treatment: Gait belt Activity Tolerance: Patient tolerated treatment well;Patient limited by fatigue Patient left: in chair;with call bell/phone within reach Nurse Communication: Mobility status PT Visit Diagnosis: Unsteadiness on feet (R26.81);Other abnormalities of gait and mobility (R26.89);Muscle weakness (generalized) (M62.81)     Time: 7282-0601 PT Time Calculation (min) (ACUTE ONLY): 32 min  Charges:  $Therapeutic Exercise: 8-22 mins $Therapeutic Activity: 8-22 mins                     [address] LPTA; CBIS [phone]  Aldona Lento 04/06/2019, 11:02 AM

## 2019-04-06 NOTE — Discharge Summary (Signed)
Physician Discharge Summary  [name] NWG:[id number] DOB: 02-19-1946 DOA: 04/01/2019  PCP: Redmond School, MD  Admit date: 04/01/2019  Discharge date: 04/06/2019  Admitted From:Home  Disposition:  SNF  Recommendations for Outpatient Follow-up:  1. Follow up with PCP in 1-2 weeks 2. Continue on doxycycline for 8 more days as prescribed for total 12-day course 3. Continue on pain medications as prescribed 4. Started on Januvia for hyperglycemia and diabetes  Home Health: None  Equipment/Devices: None  Discharge Condition: Stable  CODE STATUS: Full  Diet recommendation: Heart Healthy/carb modified  Brief/Interim Summary: Per HPI: [name] a 73 y.o.malewith medical history significant forcoronary artery disease status post drug-eluting stent placement x2 in 5784, chronic diastolic heart failure, hypertension, type 2 diabetes mellitus, stage III/IV chronic kidney disease with baseline creatinine 2.2-2.4,[facility] with sepsis due to right hand cellulitis after presenting from home to theAP [place] of right hand pain.  The patient reports 3 to 4 days of right hand discomfort associated with erythema, increased warmth, and swellingabout the dorsal aspect of the right hand.Denies any preceding trauma involving the right hand, and denies any preceding animal bites to the RUE.He reports 1 day of subjective fever in the absence of chills or rigors. Denies rash in any other location,and denies extension of the erythema into the fingersof the right hand. Within 1 day of onset of theaforementioned symptoms, the patient reports that he presented to his PCP for evaluation of such. He reports that he was diagnosed with right hand cellulitis at that time, and prescribed doxycycline.The patient reports that he felt nauseous after taking his first dose of doxycycline, prompting him to discontinue the rest of the prescribed  regimen. Subsequent to the single dose of doxycycline, the patient reports that he has had no additional antibiotics leading up to presentation to the emergency department today. He denies any associated pallor,numbness,or paresthesias involving the right hand, and he also denies any right hand discharge.  Patient was admitted for sepsis due to right hand cellulitis and was initially placed on Rocephin and clindamycin whichtransitioned to vancomycin on 4/10.  He has had significant improvement in his swelling and erythema as well as overall pain and is functionally able to use his hand much better than he was on admission.  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  He has been seen by PT/OT with recommendations for SNF placement on discharge for aggressive rehabilitation of his upper extremities.  He will be discharged on doxycycline for 8 more days to finish a 12-day course of treatment and has been given other pain medications for pain management.  No other acute events noted during the course of this admission.  Patient is otherwise stable for discharge to [facility] today.  Discharge Diagnoses:  Principal Problem:   Cellulitis of right hand Active Problems:   Essential hypertension   Chronic kidney disease, stage 3, cardiorenal syndrome   Diabetes mellitus type 2 in obese (HCC)   Diastolic CHF, chronic (HCC)   Sepsis (HCC)   Leukocytosis  Principal discharge diagnosis: Right hand cellulitis.  Discharge Instructions  Discharge Instructions    Diet - low sodium heart healthy   Complete by:  As directed    Increase activity slowly   Complete by:  As directed      Allergies as of 04/06/2019      Reactions   Niacin And Related Other (See Comments)   Reaction:  flushing and burning side effects even with extended release      Medication List    STOP taking these medications   torsemide 20 MG tablet Commonly  known as:  DEMADEX     TAKE these medications   acarbose 50 MG tablet Commonly known as:  PRECOSE Take 50 mg by mouth 3 (three) times daily with meals.   aspirin EC 81 MG tablet Take 1 tablet (81 mg total) by mouth daily for 30 days. What changed:  when to take this   calcitRIOL 0.25 MCG capsule Commonly known as:  ROCALTROL Take 1 capsule by mouth daily.   cholecalciferol 1000 units tablet Commonly known as:  VITAMIN D Take 1,000 Units daily by mouth.   doxycycline 100 MG tablet Commonly known as:  VIBRA-TABS Take 1 tablet (100 mg total) by mouth 2 (two) times daily for 8 days. 10 day course starting on 03/22/2019   ferrous sulfate 325 (65 FE) MG tablet Take 1 tablet (325 mg total) by mouth 3 (three) times daily after meals.   furosemide 40 MG tablet Commonly known as:  LASIX Take 40 mg by mouth 2 (two) times daily.   gabapentin 100 MG capsule Commonly known as:  NEURONTIN Take 1 capsule (100 mg total) by mouth 3 (three) times daily for 30 days.   HYDROcodone-acetaminophen 10-325 MG tablet Commonly known as:  NORCO Take 1-2 tablets by mouth 4 (four) times daily as needed for moderate pain or severe pain.   lactulose 10 GM/15ML solution Commonly known as:  CHRONULAC Take 10 g by mouth 3 (three) times daily as needed for mild constipation.   lidocaine 5 % Commonly known as:  LIDODERM Place 1 patch onto the skin daily. Remove & Discard patch within 12 hours or as directed by MD Start taking on:  April 07, 2019   LORazepam 0.5 MG tablet Commonly known as:  ATIVAN Take 1 tablet (0.5 mg total) by mouth every 6 (six) hours as needed for up to 5 days (Dizziness).   meclizine 25 MG tablet Commonly known as:  ANTIVERT Take 25 mg by mouth every 6 (six) hours as needed for dizziness.   mupirocin ointment 2 % Commonly known as:  BACTROBAN Apply 1 application topically 2 (two) times daily.   nitroGLYCERIN 0.4 MG SL tablet Commonly known as:  NITROSTAT PLACE ONE TABLET  UNDER THE TONGUE EVERY 5 MINUTES FOR 3 DOSES AS NEEDED What changed:  See the new instructions.   ondansetron 4 MG disintegrating tablet Commonly known as:  Zofran ODT 6m ODT q4 hours prn nausea/vomit What changed:    how much to take  how to take this  when to take this  reasons to take this  additional instructions   polyethylene glycol 17 g packet Commonly known as:  MIRALAX / GLYCOLAX Take 17 g by mouth daily. Start taking on:  April 07, 2019   sitaGLIPtin 25 MG tablet Commonly known as:  Januvia Take 1 tablet (25 mg total) by mouth daily for 30 days.      Follow-up Information    FRedmond School MD Follow up in 2 week(s).   Specialty:  Internal Medicine Contact information: [phone]        KHerminio Commons MD .   Specialty:  Cardiology Contact information: [phone]         Allergies  Allergen Reactions  . Niacin And Related Other (See Comments)  Reaction: flushing and burning side effects even with extended release    Consultations:  None   Procedures/Studies: Dg Shoulder Left Port  Result Date: 04/02/2019 CLINICAL DATA:  Left shoulder pain. EXAM: LEFT SHOULDER - 1 VIEW COMPARISON:  None. FINDINGS: No acute fracture or dislocation is identified. There is mild acromioclavicular spurring. The acromiohumeral interval is borderline narrowed. The soft tissues are unremarkable. IMPRESSION: Mild acromioclavicular osteoarthrosis without acute osseous abnormality identified. Electronically Signed   By: Logan Bores M.D.   On: 04/02/2019 13:34   Dg Hand Complete Right  Result Date: 04/02/2019 CLINICAL DATA:  Right hand pain and swelling EXAM: RIGHT HAND - COMPLETE 3+ VIEW COMPARISON:  Or 920 FINDINGS: No fracture or dislocation. Generalized osteopenia. Mild osteoarthritis of the first MCP joint and first IP joint. Soft tissue swelling along the dorsal aspect  of the hand. IMPRESSION: No acute osseous injury of the right hand. Electronically Signed   By: Kathreen Devoid   On: 04/02/2019 13:33   Dg Hand Complete Right  Result Date: 04/01/2019 CLINICAL DATA:  Pain and swelling EXAM: RIGHT HAND - COMPLETE 3+ VIEW COMPARISON:  None. FINDINGS: Frontal, oblique, and lateral views obtained. There is marked soft tissue swelling diffusely without radiopaque foreign body or soft tissue air. No acute fracture or dislocation. Suspect previous trauma in the region of the triquetrum bone. There is osteoarthritic change in the radiocarpal joint. There is also osteoarthritic change in the scaphotrapezial joint. There is slight narrowing of all PIP joints. No erosive changes or bony destruction. IMPRESSION: Marked generalized soft tissue swelling without soft tissue air or radiopaque foreign body. No bony destruction or erosion. No acute fracture or dislocation. Suspect old trauma involving the right triquetrum bone. Areas of osteoarthritic change noted. Electronically Signed   By: Lowella Grip III M.D.   On: 04/01/2019 15:26     Discharge Exam: Vitals:   04/05/19 2307 04/06/19 0649  BP: (!) 126/59 (!) 114/57  Pulse: 73 67  Resp: 20   Temp: 98.2 F (36.8 C) 98.1 F (36.7 C)  SpO2: 98% 98%   Vitals:   04/05/19 0532 04/05/19 1352 04/05/19 2307 04/06/19 0649  BP: 130/64 105/66 (!) 126/59 (!) 114/57  Pulse: 77 80 73 67  Resp: 20 17 20    Temp: 97.8 F (36.6 C) (!) 97.4 F (36.3 C) 98.2 F (36.8 C) 98.1 F (36.7 C)  TempSrc: Oral Oral Oral Oral  SpO2: 93% 100% 98% 98%  Weight:    132.8 kg  Height:        General: Pt is alert, awake, not in acute distress Cardiovascular: RRR, S1/S2 +, no rubs, no gallops Respiratory: CTA bilaterally, no wheezing, no rhonchi Abdominal: Soft, NT, ND, bowel sounds + Extremities: no edema, no cyanosis, right hand swelling and cellulitis improved    The results of significant diagnostics from this hospitalization (including  imaging, microbiology, ancillary and laboratory) are listed below for reference.     Microbiology: Recent Results (from the past 240 hour(s))  Culture, blood (routine x 2)     Status: None   Collection Time: 04/01/19  9:05 PM  Result Value Ref Range Status   Specimen Description BLOOD RIGHT ANTECUBITAL  Final   Special Requests   Final    BOTTLES DRAWN AEROBIC AND ANAEROBIC Blood Culture adequate volume   Culture   Final    NO GROWTH 5 DAYS Performed at [facility], [address], [place], [place] [address]    Report Status 04/06/2019 FINAL  Final  Culture, blood (routine x 2)     Status: None   Collection Time: 04/01/19  9:10 PM  Result Value Ref Range Status   Specimen Description BLOOD RIGHT HAND  Final   Special Requests   Final    AEROBIC BOTTLE ONLY Blood Culture results may not be optimal due to an inadequate volume of blood received in culture bottles   Culture   Final    NO GROWTH 5 DAYS Performed at [facility], [address], [place], [place] [address]    Report Status 04/06/2019 FINAL  Final     Labs: BNP (last 3 results) Recent Labs    02/19/19 1427  BNP 26.9   Basic Metabolic Panel: Recent Labs  Lab 04/01/19 1611 04/02/19 0557 04/03/19 0650 04/04/19 0617 04/06/19 0440  NA 139 142 143 144 143  K 4.0 3.9 4.1 4.3 4.7  CL 102 107 108 110 110  CO2 22 21* 22 21* 21*  GLUCOSE 319* 251* 233* 208* 151*  BUN 69* 61* 51* 54* 56*  CREATININE 2.46* 2.17* 2.11* 2.06* 2.18*  CALCIUM 9.3 9.3 9.2 9.4 9.4  MG  --  1.9  --   --   --    Liver Function Tests: Recent Labs  Lab 04/01/19 1611  AST 22  ALT 19  ALKPHOS 57  BILITOT 0.8  PROT 8.4*  ALBUMIN 3.3*   No results for input(s): LIPASE, AMYLASE in the last 168 hours. No results for input(s): AMMONIA in the last 168 hours. CBC: Recent Labs  Lab 04/01/19 1611 04/02/19 0557 04/03/19 0650 04/04/19 0617 04/05/19 0550 04/06/19 0440  WBC 13.3* 11.9* 13.2* 14.1* 12.6* 10.9*  NEUTROABS 10.8* 9.3*  --    --   --   --   HGB 12.0* 11.2* 10.6* 11.8* 10.8* 12.2*  HCT 37.0* 35.5* 33.8* 37.3* 35.4* 40.4  MCV 100.5* 102.9* 103.0* 103.3* 105.4* 106.3*  PLT 222 241 255 279 302 305   Cardiac Enzymes: No results for input(s): CKTOTAL, CKMB, CKMBINDEX, TROPONINI in the last 168 hours. BNP: Invalid input(s): POCBNP CBG: Recent Labs  Lab 04/05/19 0744 04/05/19 1125 04/05/19 1613 04/05/19 2303 04/06/19 0730  GLUCAP 166* 193* 216* 142* 157*   D-Dimer No results for input(s): DDIMER in the last 72 hours. Hgb A1c No results for input(s): HGBA1C in the last 72 hours. Lipid Profile No results for input(s): CHOL, HDL, LDLCALC, TRIG, CHOLHDL, LDLDIRECT in the last 72 hours. Thyroid function studies No results for input(s): TSH, T4TOTAL, T3FREE, THYROIDAB in the last 72 hours.  Invalid input(s): FREET3 Anemia work up No results for input(s): VITAMINB12, FOLATE, FERRITIN, TIBC, IRON, RETICCTPCT in the last 72 hours. Urinalysis    Component Value Date/Time   COLORURINE YELLOW 12/14/2016 [place] 12/14/2016 0941   LABSPEC <1.005 (L) 12/14/2016 0941   PHURINE 5.5 12/14/2016 0941   GLUCOSEU NEGATIVE 12/14/2016 0941   HGBUR NEGATIVE 12/14/2016 0941   BILIRUBINUR NEGATIVE 12/14/2016 0941   KETONESUR NEGATIVE 12/14/2016 0941   PROTEINUR NEGATIVE 12/14/2016 0941   UROBILINOGEN 1.0 03/02/2015 1100   NITRITE NEGATIVE 12/14/2016 0941   LEUKOCYTESUR NEGATIVE 12/14/2016 0941   Sepsis Labs Invalid input(s): PROCALCITONIN,  WBC,  LACTICIDVEN Microbiology Recent Results (from the past 240 hour(s))  Culture, blood (routine x 2)     Status: None   Collection Time: 04/01/19  9:05 PM  Result Value Ref Range Status   Specimen Description BLOOD RIGHT ANTECUBITAL  Final   Special Requests   Final  BOTTLES DRAWN AEROBIC AND ANAEROBIC Blood Culture adequate volume   Culture   Final    NO GROWTH 5 DAYS Performed at [facility], [address] [place], [place] [address]    Report  Status 04/06/2019 FINAL  Final  Culture, blood (routine x 2)     Status: None   Collection Time: 04/01/19  9:10 PM  Result Value Ref Range Status   Specimen Description BLOOD RIGHT HAND  Final   Special Requests   Final    AEROBIC BOTTLE ONLY Blood Culture results may not be optimal due to an inadequate volume of blood received in culture bottles   Culture   Final    NO GROWTH 5 DAYS Performed at [facility], [address], [place],  [address]    Report Status 04/06/2019 FINAL  Final     Time coordinating discharge: 35 minutes  SIGNED:   Rodena Goldmann, DO [facility] 04/06/2019, 11:14 AM  If 7PM-7AM, please contact night-coverage [email or web address] Password [email or web address]

## 2019-04-06 NOTE — Plan of Care (Deleted)
Patient is progressing with care plans. He is now weaned off oxygen with oxygen saturations at 94% on room air.

## 2019-04-06 NOTE — TOC Transition Note (Addendum)
Transition of Care [facility]) - CM/SW Discharge Note   Patient Details  Name: [name] MRN: [id number] Date of Birth: 1946-03-10  [facility] CM/SW Contact:  Jadie Comas, Chauncey Reading, RN Phone Number: 04/06/2019, 11:20 AM   Clinical Narrative:    Patient chooses bed at [facility].  He has [facility] Medicare and authorization have been waived until May 23, 2019. Left VM with Debbie of [facility] to notify of bed acceptance.  Patient will DC today.  Patient's nephew will transport patient today. Updated bedside RN.    ADDENDUM : Patient will go to room A1-1. Bedside RN to call report. DC clinicals sent.   Final next level of care: Skilled Nursing Facility Barriers to Discharge: No Barriers Identified   Patient Goals and CMS Choice Patient states their goals for this hospitalization and ongoing recovery are:: to return home with no pain CMS Medicare.gov Compare Post Acute Care list provided to:: Patient Choice offered to / list presented to : Patient  Discharge Placement              Patient chooses bed at: Other - please specify in the comment section below:([facility]) Patient to be transferred to facility by: EMS Name of family member notified: only notified patient, patient alert and oriented X4  Patient and family notified of of transfer: 04/06/19  Discharge Plan and Services   Discharge Planning Services: CM Consult                         Readmission Risk Interventions No flowsheet data found.

## 2019-04-06 NOTE — Progress Notes (Signed)
Inpatient Diabetes Program Recommendations  AACE/ADA: New Consensus Statement on Inpatient Glycemic Control (2015)  Target Ranges:  Prepandial:   less than 140 mg/dL      Peak postprandial:   less than 180 mg/dL (1-2 hours)      Critically ill patients:  140 - 180 mg/dL  Results for [name], [name] (MRN [id number]) as of 04/06/2019 10:37  Ref. Range 04/05/2019 07:44 04/05/2019 11:25 04/05/2019 16:13 04/05/2019 23:03 04/06/2019 07:30  Glucose-Capillary Latest Ref Range: 70 - 99 mg/dL 166 (H) 193 (H) 216 (H) 142 (H) 157 (H)   Results for [name], [name] (MRN [id number]) as of 04/06/2019 10:37  Ref. Range 04/02/2019 05:57  Hemoglobin A1C Latest Ref Range: 4.8 - 5.6 % 8.4 (H)   Review of Glycemic Control  Diabetes history: DM2 Outpatient Diabetes medications: Acarbose 50 mg TID with meals Current orders for Inpatient glycemic control: Novolog 0-20 units TID with meals, Novolog 0-5 units QHS, Novolog 7 units TID with meals   Inpatient Diabetes Program Recommendations:   HgbA1C: A1C 8.4% on 04/02/19 indicating an average glucose of 194 mg/dl over the past 2-3 months. Patient was recently prescribed Prednisone dose pack on 03/12/19 which is contributing to elevated A1C.  NOTE: Spoke with patient over the phone regarding DM control. Patient states that he sees Dr. [name] for DM management and that he takes "a white pill three times a day for diabetes." Patient does not recall the name of the pill. Patient states that he also follows a diabetic diet and exercises regularly. Patient reports that he does not routinely check his glucose at home but notes that his glucose runs fairly good.  Patient does not know prior A1C value. Discussed current A1C of 8.4% indicating an average glucose of 194 mg/dl.  Inquired about any recent steroids over the past 3 months and patient states he is not sure if he has taken any steroids or not lately.  Patient states that he has not been able to exercise as much as normal  recently due to COVID restrictions.  Encouraged patient to check glucose at least one a day a home and to follow up with PCP regarding DM control. Patient verbalized understanding of information discussed. Called Dr. Nolon Rod office to inquire about DM medication, A1C, and if patient has been prescribed any steroids recently. Spoke with Letta Median, office nurse, and she confirms patient is taking Acarbose 50 mg TID, last A1C was 6% (almost a year ago), and patient was prescribed Prednisone taper pack on 03/12/19. Anticipate recent steroid dose pack contributing to elevated A1C. Will continue to follow while inpatient.  Thanks, Barnie Alderman, RN, MSN, CDE Diabetes Coordinator Inpatient Diabetes Program [phone] (Team Pager from 8am to 5pm)

## 2019-04-06 NOTE — Progress Notes (Signed)
Occupational Therapy Treatment Patient Details Name: [name] MRN: [id number] DOB: 25-Oct-1946 Today's Date: 04/06/2019    History of present illness Patient was admitted for sepsis due to right hand cellulitis and was initially placed on Rocephin and clindamycin which transitioned to vancomycin on 4/10.  He appears to be having some improvement but continues to have persistent left shoulder pain.   OT comments  Pt seen with PTA this am, improvement in active motion of BUE. Pt finishing breakfast on OT arrival, not attempting to use LUE to assist. Continues to be extremely painful in LUE, however was able to tolerate self AA/ROM this am. Pt with good trapezius activation and was able to tolerate AA/ROM in LUE to approximately 30-40% ROM. Pt left in room in recliner with PTA with LUE propped on pillows. Discharge to SNF remains appropriate.    Follow Up Recommendations  SNF    Equipment Recommendations  3 in 1 bedside commode;Tub/shower seat       Precautions / Restrictions Precautions Precautions: Fall Restrictions Weight Bearing Restrictions: No       Mobility Bed Mobility               General bed mobility comments: Defer to PT note  Transfers                 General transfer comment: defer to PT note            Cognition Arousal/Alertness: Awake/alert Behavior During Therapy: WFL for tasks assessed/performed Overall Cognitive Status: Within Functional Limits for tasks assessed                                          Exercises Exercises: General Upper Extremity;Hand exercises;Other exercises General Exercises - Upper Extremity Shoulder Flexion: AAROM;Both;10 reps;Seated Elbow Flexion: AAROM;Both;10 reps;Seated Elbow Extension: AAROM;Both;10 reps;Seated Digit Composite Flexion: AROM;Both;10 reps;Seated Composite Extension: AROM;10 reps;Seated Other Exercises Other Exercises: shoulder protraction, AA/ROM, BUE 10X Other  Exercises: scapular elevation, BUE, A/ROM, 10X Other Exercises: scapular row/retraction, BUE, A/ROM 10X   Shoulder Instructions       General Comments      Pertinent Vitals/ Pain       Pain Assessment: 0-10 Pain Score: 9  Pain Location: LUE Pain Descriptors / Indicators: Aching;Sharp;Grimacing;Tender;Guarding Pain Intervention(s): Limited activity within patient's tolerance;Monitored during session;Repositioned;RN gave pain meds during session         Frequency  Min 2X/week        Progress Toward Goals  OT Goals(current goals can now be found in the care plan section)  Progress towards OT goals: Progressing toward goals  Acute Rehab OT Goals Patient Stated Goal: return home OT Goal Formulation: With patient Time For Goal Achievement: 04/19/19 Potential to Achieve Goals: Good ADL Goals Pt Will Perform Eating: with modified independence;sitting;with adaptive utensils Pt Will Perform Grooming: sitting;with min assist Pt Will Perform Upper Body Bathing: with mod assist;sitting Pt Will Perform Upper Body Dressing: with mod assist;sitting Pt Will Transfer to Toilet: with mod assist;bedside commode Pt Will Perform Toileting - Clothing Manipulation and hygiene: with mod assist;sitting/lateral leans;sit to/from stand Pt/caregiver will Perform Home Exercise Program: Increased ROM;Increased strength;Both right and left upper extremity;With minimal assist;With written HEP provided  Plan Discharge plan remains appropriate    Co-evaluation    PT/OT/SLP Co-Evaluation/Treatment: Yes Reason for Co-Treatment: Complexity of the patient's impairments (multi-system involvement)   OT goals addressed during session:  Strengthening/ROM         End of Session Equipment Utilized During Treatment: Gait belt;Rolling walker  OT Visit Diagnosis: Muscle weakness (generalized) (M62.81)   Activity Tolerance Patient limited by pain   Patient Left in chair;with chair alarm set;with call  bell/phone within reach;Other (comment)(PTA in room)   Nurse Communication          Time: [phone] OT Time Calculation (min): 26 min  Charges: OT General Charges $OT Visit: 1 Visit OT Treatments $Therapeutic Exercise: 8-22 mins    Guadelupe Sabin, OTR/L  [phone]  04/06/2019, 9:22 AM

## 2019-04-08 DIAGNOSIS — R6889 Other general symptoms and signs: Secondary | ICD-10-CM | POA: Diagnosis not present

## 2019-04-08 DIAGNOSIS — E1122 Type 2 diabetes mellitus with diabetic chronic kidney disease: Secondary | ICD-10-CM | POA: Diagnosis not present

## 2019-04-08 DIAGNOSIS — N183 Chronic kidney disease, stage 3 (moderate): Secondary | ICD-10-CM | POA: Diagnosis not present

## 2019-04-08 DIAGNOSIS — L03113 Cellulitis of right upper limb: Secondary | ICD-10-CM | POA: Diagnosis not present

## 2019-04-13 DIAGNOSIS — L97421 Non-pressure chronic ulcer of left heel and midfoot limited to breakdown of skin: Secondary | ICD-10-CM | POA: Diagnosis not present

## 2019-04-13 DIAGNOSIS — M6281 Muscle weakness (generalized): Secondary | ICD-10-CM | POA: Diagnosis not present

## 2019-04-13 DIAGNOSIS — R2689 Other abnormalities of gait and mobility: Secondary | ICD-10-CM | POA: Diagnosis not present

## 2019-04-14 DIAGNOSIS — I5030 Unspecified diastolic (congestive) heart failure: Secondary | ICD-10-CM | POA: Diagnosis not present

## 2019-04-14 DIAGNOSIS — I2510 Atherosclerotic heart disease of native coronary artery without angina pectoris: Secondary | ICD-10-CM | POA: Diagnosis not present

## 2019-04-14 DIAGNOSIS — L03113 Cellulitis of right upper limb: Secondary | ICD-10-CM | POA: Diagnosis not present

## 2019-04-14 DIAGNOSIS — I10 Essential (primary) hypertension: Secondary | ICD-10-CM | POA: Diagnosis not present

## 2019-04-19 DIAGNOSIS — E1121 Type 2 diabetes mellitus with diabetic nephropathy: Secondary | ICD-10-CM | POA: Diagnosis not present

## 2019-04-19 DIAGNOSIS — L03113 Cellulitis of right upper limb: Secondary | ICD-10-CM | POA: Diagnosis not present

## 2019-04-19 DIAGNOSIS — E782 Mixed hyperlipidemia: Secondary | ICD-10-CM | POA: Diagnosis not present

## 2019-04-19 DIAGNOSIS — I130 Hypertensive heart and chronic kidney disease with heart failure and stage 1 through stage 4 chronic kidney disease, or unspecified chronic kidney disease: Secondary | ICD-10-CM | POA: Diagnosis not present

## 2019-04-19 DIAGNOSIS — I10 Essential (primary) hypertension: Secondary | ICD-10-CM | POA: Diagnosis not present

## 2019-04-19 DIAGNOSIS — E1122 Type 2 diabetes mellitus with diabetic chronic kidney disease: Secondary | ICD-10-CM | POA: Diagnosis not present

## 2019-04-19 DIAGNOSIS — N183 Chronic kidney disease, stage 3 (moderate): Secondary | ICD-10-CM | POA: Diagnosis not present

## 2019-04-20 DIAGNOSIS — R2689 Other abnormalities of gait and mobility: Secondary | ICD-10-CM | POA: Diagnosis not present

## 2019-04-20 DIAGNOSIS — L97421 Non-pressure chronic ulcer of left heel and midfoot limited to breakdown of skin: Secondary | ICD-10-CM | POA: Diagnosis not present

## 2019-04-20 DIAGNOSIS — M6281 Muscle weakness (generalized): Secondary | ICD-10-CM | POA: Diagnosis not present

## 2019-04-23 DIAGNOSIS — L03113 Cellulitis of right upper limb: Secondary | ICD-10-CM | POA: Diagnosis not present

## 2019-04-23 DIAGNOSIS — E119 Type 2 diabetes mellitus without complications: Secondary | ICD-10-CM | POA: Diagnosis not present

## 2019-04-23 DIAGNOSIS — I10 Essential (primary) hypertension: Secondary | ICD-10-CM | POA: Diagnosis not present

## 2019-04-23 DIAGNOSIS — G894 Chronic pain syndrome: Secondary | ICD-10-CM | POA: Diagnosis not present

## 2019-04-26 DIAGNOSIS — I5032 Chronic diastolic (congestive) heart failure: Secondary | ICD-10-CM | POA: Diagnosis not present

## 2019-04-26 DIAGNOSIS — I130 Hypertensive heart and chronic kidney disease with heart failure and stage 1 through stage 4 chronic kidney disease, or unspecified chronic kidney disease: Secondary | ICD-10-CM | POA: Diagnosis not present

## 2019-04-26 DIAGNOSIS — G912 (Idiopathic) normal pressure hydrocephalus: Secondary | ICD-10-CM | POA: Diagnosis not present

## 2019-04-26 DIAGNOSIS — M1991 Primary osteoarthritis, unspecified site: Secondary | ICD-10-CM | POA: Diagnosis not present

## 2019-04-26 DIAGNOSIS — Z9181 History of falling: Secondary | ICD-10-CM | POA: Diagnosis not present

## 2019-04-26 DIAGNOSIS — E1122 Type 2 diabetes mellitus with diabetic chronic kidney disease: Secondary | ICD-10-CM | POA: Diagnosis not present

## 2019-04-26 DIAGNOSIS — Z955 Presence of coronary angioplasty implant and graft: Secondary | ICD-10-CM | POA: Diagnosis not present

## 2019-04-26 DIAGNOSIS — E559 Vitamin D deficiency, unspecified: Secondary | ICD-10-CM | POA: Diagnosis not present

## 2019-04-26 DIAGNOSIS — Z86010 Personal history of colonic polyps: Secondary | ICD-10-CM | POA: Diagnosis not present

## 2019-04-26 DIAGNOSIS — L03113 Cellulitis of right upper limb: Secondary | ICD-10-CM | POA: Diagnosis not present

## 2019-04-26 DIAGNOSIS — A419 Sepsis, unspecified organism: Secondary | ICD-10-CM | POA: Diagnosis not present

## 2019-04-26 DIAGNOSIS — N184 Chronic kidney disease, stage 4 (severe): Secondary | ICD-10-CM | POA: Diagnosis not present

## 2019-04-26 DIAGNOSIS — D631 Anemia in chronic kidney disease: Secondary | ICD-10-CM | POA: Diagnosis not present

## 2019-04-26 DIAGNOSIS — K5900 Constipation, unspecified: Secondary | ICD-10-CM | POA: Diagnosis not present

## 2019-04-26 DIAGNOSIS — Z7982 Long term (current) use of aspirin: Secondary | ICD-10-CM | POA: Diagnosis not present

## 2019-04-26 DIAGNOSIS — Z96653 Presence of artificial knee joint, bilateral: Secondary | ICD-10-CM | POA: Diagnosis not present

## 2019-04-26 DIAGNOSIS — E1151 Type 2 diabetes mellitus with diabetic peripheral angiopathy without gangrene: Secondary | ICD-10-CM | POA: Diagnosis not present

## 2019-04-26 DIAGNOSIS — E1140 Type 2 diabetes mellitus with diabetic neuropathy, unspecified: Secondary | ICD-10-CM | POA: Diagnosis not present

## 2019-04-26 DIAGNOSIS — Z7984 Long term (current) use of oral hypoglycemic drugs: Secondary | ICD-10-CM | POA: Diagnosis not present

## 2019-04-26 DIAGNOSIS — I2510 Atherosclerotic heart disease of native coronary artery without angina pectoris: Secondary | ICD-10-CM | POA: Diagnosis not present

## 2019-04-27 DIAGNOSIS — A419 Sepsis, unspecified organism: Secondary | ICD-10-CM | POA: Diagnosis not present

## 2019-04-27 DIAGNOSIS — Z7982 Long term (current) use of aspirin: Secondary | ICD-10-CM | POA: Diagnosis not present

## 2019-04-27 DIAGNOSIS — D631 Anemia in chronic kidney disease: Secondary | ICD-10-CM | POA: Diagnosis not present

## 2019-04-27 DIAGNOSIS — G912 (Idiopathic) normal pressure hydrocephalus: Secondary | ICD-10-CM | POA: Diagnosis not present

## 2019-04-27 DIAGNOSIS — Z7984 Long term (current) use of oral hypoglycemic drugs: Secondary | ICD-10-CM | POA: Diagnosis not present

## 2019-04-27 DIAGNOSIS — L03113 Cellulitis of right upper limb: Secondary | ICD-10-CM | POA: Diagnosis not present

## 2019-04-27 DIAGNOSIS — Z86010 Personal history of colonic polyps: Secondary | ICD-10-CM | POA: Diagnosis not present

## 2019-04-27 DIAGNOSIS — Z9181 History of falling: Secondary | ICD-10-CM | POA: Diagnosis not present

## 2019-04-27 DIAGNOSIS — M1991 Primary osteoarthritis, unspecified site: Secondary | ICD-10-CM | POA: Diagnosis not present

## 2019-04-27 DIAGNOSIS — I5032 Chronic diastolic (congestive) heart failure: Secondary | ICD-10-CM | POA: Diagnosis not present

## 2019-04-27 DIAGNOSIS — E1151 Type 2 diabetes mellitus with diabetic peripheral angiopathy without gangrene: Secondary | ICD-10-CM | POA: Diagnosis not present

## 2019-04-27 DIAGNOSIS — Z955 Presence of coronary angioplasty implant and graft: Secondary | ICD-10-CM | POA: Diagnosis not present

## 2019-04-27 DIAGNOSIS — E1140 Type 2 diabetes mellitus with diabetic neuropathy, unspecified: Secondary | ICD-10-CM | POA: Diagnosis not present

## 2019-04-27 DIAGNOSIS — I2510 Atherosclerotic heart disease of native coronary artery without angina pectoris: Secondary | ICD-10-CM | POA: Diagnosis not present

## 2019-04-27 DIAGNOSIS — I130 Hypertensive heart and chronic kidney disease with heart failure and stage 1 through stage 4 chronic kidney disease, or unspecified chronic kidney disease: Secondary | ICD-10-CM | POA: Diagnosis not present

## 2019-04-27 DIAGNOSIS — E1122 Type 2 diabetes mellitus with diabetic chronic kidney disease: Secondary | ICD-10-CM | POA: Diagnosis not present

## 2019-04-27 DIAGNOSIS — K5900 Constipation, unspecified: Secondary | ICD-10-CM | POA: Diagnosis not present

## 2019-04-27 DIAGNOSIS — E559 Vitamin D deficiency, unspecified: Secondary | ICD-10-CM | POA: Diagnosis not present

## 2019-04-27 DIAGNOSIS — N184 Chronic kidney disease, stage 4 (severe): Secondary | ICD-10-CM | POA: Diagnosis not present

## 2019-04-27 DIAGNOSIS — Z96653 Presence of artificial knee joint, bilateral: Secondary | ICD-10-CM | POA: Diagnosis not present

## 2019-04-29 DIAGNOSIS — Z955 Presence of coronary angioplasty implant and graft: Secondary | ICD-10-CM | POA: Diagnosis not present

## 2019-04-29 DIAGNOSIS — Z96653 Presence of artificial knee joint, bilateral: Secondary | ICD-10-CM | POA: Diagnosis not present

## 2019-04-29 DIAGNOSIS — E559 Vitamin D deficiency, unspecified: Secondary | ICD-10-CM | POA: Diagnosis not present

## 2019-04-29 DIAGNOSIS — L03113 Cellulitis of right upper limb: Secondary | ICD-10-CM | POA: Diagnosis not present

## 2019-04-29 DIAGNOSIS — E1122 Type 2 diabetes mellitus with diabetic chronic kidney disease: Secondary | ICD-10-CM | POA: Diagnosis not present

## 2019-04-29 DIAGNOSIS — G912 (Idiopathic) normal pressure hydrocephalus: Secondary | ICD-10-CM | POA: Diagnosis not present

## 2019-04-29 DIAGNOSIS — N184 Chronic kidney disease, stage 4 (severe): Secondary | ICD-10-CM | POA: Diagnosis not present

## 2019-04-29 DIAGNOSIS — Z86010 Personal history of colonic polyps: Secondary | ICD-10-CM | POA: Diagnosis not present

## 2019-04-29 DIAGNOSIS — M1991 Primary osteoarthritis, unspecified site: Secondary | ICD-10-CM | POA: Diagnosis not present

## 2019-04-29 DIAGNOSIS — Z9181 History of falling: Secondary | ICD-10-CM | POA: Diagnosis not present

## 2019-04-29 DIAGNOSIS — E1140 Type 2 diabetes mellitus with diabetic neuropathy, unspecified: Secondary | ICD-10-CM | POA: Diagnosis not present

## 2019-04-29 DIAGNOSIS — E1151 Type 2 diabetes mellitus with diabetic peripheral angiopathy without gangrene: Secondary | ICD-10-CM | POA: Diagnosis not present

## 2019-04-29 DIAGNOSIS — I5032 Chronic diastolic (congestive) heart failure: Secondary | ICD-10-CM | POA: Diagnosis not present

## 2019-04-29 DIAGNOSIS — I2510 Atherosclerotic heart disease of native coronary artery without angina pectoris: Secondary | ICD-10-CM | POA: Diagnosis not present

## 2019-04-29 DIAGNOSIS — A419 Sepsis, unspecified organism: Secondary | ICD-10-CM | POA: Diagnosis not present

## 2019-04-29 DIAGNOSIS — Z7982 Long term (current) use of aspirin: Secondary | ICD-10-CM | POA: Diagnosis not present

## 2019-04-29 DIAGNOSIS — K5900 Constipation, unspecified: Secondary | ICD-10-CM | POA: Diagnosis not present

## 2019-04-29 DIAGNOSIS — D631 Anemia in chronic kidney disease: Secondary | ICD-10-CM | POA: Diagnosis not present

## 2019-04-29 DIAGNOSIS — Z7984 Long term (current) use of oral hypoglycemic drugs: Secondary | ICD-10-CM | POA: Diagnosis not present

## 2019-04-29 DIAGNOSIS — I130 Hypertensive heart and chronic kidney disease with heart failure and stage 1 through stage 4 chronic kidney disease, or unspecified chronic kidney disease: Secondary | ICD-10-CM | POA: Diagnosis not present

## 2019-05-02 DIAGNOSIS — E1122 Type 2 diabetes mellitus with diabetic chronic kidney disease: Secondary | ICD-10-CM | POA: Diagnosis not present

## 2019-05-02 DIAGNOSIS — I130 Hypertensive heart and chronic kidney disease with heart failure and stage 1 through stage 4 chronic kidney disease, or unspecified chronic kidney disease: Secondary | ICD-10-CM | POA: Diagnosis not present

## 2019-05-02 DIAGNOSIS — A419 Sepsis, unspecified organism: Secondary | ICD-10-CM | POA: Diagnosis not present

## 2019-05-02 DIAGNOSIS — L03113 Cellulitis of right upper limb: Secondary | ICD-10-CM | POA: Diagnosis not present

## 2019-05-05 DIAGNOSIS — Z7984 Long term (current) use of oral hypoglycemic drugs: Secondary | ICD-10-CM | POA: Diagnosis not present

## 2019-05-05 DIAGNOSIS — E1151 Type 2 diabetes mellitus with diabetic peripheral angiopathy without gangrene: Secondary | ICD-10-CM | POA: Diagnosis not present

## 2019-05-05 DIAGNOSIS — L03113 Cellulitis of right upper limb: Secondary | ICD-10-CM | POA: Diagnosis not present

## 2019-05-05 DIAGNOSIS — M1991 Primary osteoarthritis, unspecified site: Secondary | ICD-10-CM | POA: Diagnosis not present

## 2019-05-05 DIAGNOSIS — Z7982 Long term (current) use of aspirin: Secondary | ICD-10-CM | POA: Diagnosis not present

## 2019-05-05 DIAGNOSIS — Z9181 History of falling: Secondary | ICD-10-CM | POA: Diagnosis not present

## 2019-05-05 DIAGNOSIS — D631 Anemia in chronic kidney disease: Secondary | ICD-10-CM | POA: Diagnosis not present

## 2019-05-05 DIAGNOSIS — E559 Vitamin D deficiency, unspecified: Secondary | ICD-10-CM | POA: Diagnosis not present

## 2019-05-05 DIAGNOSIS — Z955 Presence of coronary angioplasty implant and graft: Secondary | ICD-10-CM | POA: Diagnosis not present

## 2019-05-05 DIAGNOSIS — K5900 Constipation, unspecified: Secondary | ICD-10-CM | POA: Diagnosis not present

## 2019-05-05 DIAGNOSIS — G912 (Idiopathic) normal pressure hydrocephalus: Secondary | ICD-10-CM | POA: Diagnosis not present

## 2019-05-05 DIAGNOSIS — N184 Chronic kidney disease, stage 4 (severe): Secondary | ICD-10-CM | POA: Diagnosis not present

## 2019-05-05 DIAGNOSIS — I130 Hypertensive heart and chronic kidney disease with heart failure and stage 1 through stage 4 chronic kidney disease, or unspecified chronic kidney disease: Secondary | ICD-10-CM | POA: Diagnosis not present

## 2019-05-05 DIAGNOSIS — A419 Sepsis, unspecified organism: Secondary | ICD-10-CM | POA: Diagnosis not present

## 2019-05-05 DIAGNOSIS — E1140 Type 2 diabetes mellitus with diabetic neuropathy, unspecified: Secondary | ICD-10-CM | POA: Diagnosis not present

## 2019-05-05 DIAGNOSIS — E1122 Type 2 diabetes mellitus with diabetic chronic kidney disease: Secondary | ICD-10-CM | POA: Diagnosis not present

## 2019-05-05 DIAGNOSIS — Z96653 Presence of artificial knee joint, bilateral: Secondary | ICD-10-CM | POA: Diagnosis not present

## 2019-05-05 DIAGNOSIS — I2510 Atherosclerotic heart disease of native coronary artery without angina pectoris: Secondary | ICD-10-CM | POA: Diagnosis not present

## 2019-05-05 DIAGNOSIS — I5032 Chronic diastolic (congestive) heart failure: Secondary | ICD-10-CM | POA: Diagnosis not present

## 2019-05-05 DIAGNOSIS — Z86010 Personal history of colonic polyps: Secondary | ICD-10-CM | POA: Diagnosis not present

## 2019-05-07 DIAGNOSIS — E1151 Type 2 diabetes mellitus with diabetic peripheral angiopathy without gangrene: Secondary | ICD-10-CM | POA: Diagnosis not present

## 2019-05-07 DIAGNOSIS — Z7982 Long term (current) use of aspirin: Secondary | ICD-10-CM | POA: Diagnosis not present

## 2019-05-07 DIAGNOSIS — Z955 Presence of coronary angioplasty implant and graft: Secondary | ICD-10-CM | POA: Diagnosis not present

## 2019-05-07 DIAGNOSIS — I130 Hypertensive heart and chronic kidney disease with heart failure and stage 1 through stage 4 chronic kidney disease, or unspecified chronic kidney disease: Secondary | ICD-10-CM | POA: Diagnosis not present

## 2019-05-07 DIAGNOSIS — K5900 Constipation, unspecified: Secondary | ICD-10-CM | POA: Diagnosis not present

## 2019-05-07 DIAGNOSIS — N184 Chronic kidney disease, stage 4 (severe): Secondary | ICD-10-CM | POA: Diagnosis not present

## 2019-05-07 DIAGNOSIS — E1122 Type 2 diabetes mellitus with diabetic chronic kidney disease: Secondary | ICD-10-CM | POA: Diagnosis not present

## 2019-05-07 DIAGNOSIS — Z96653 Presence of artificial knee joint, bilateral: Secondary | ICD-10-CM | POA: Diagnosis not present

## 2019-05-07 DIAGNOSIS — E1140 Type 2 diabetes mellitus with diabetic neuropathy, unspecified: Secondary | ICD-10-CM | POA: Diagnosis not present

## 2019-05-07 DIAGNOSIS — I2510 Atherosclerotic heart disease of native coronary artery without angina pectoris: Secondary | ICD-10-CM | POA: Diagnosis not present

## 2019-05-07 DIAGNOSIS — Z7984 Long term (current) use of oral hypoglycemic drugs: Secondary | ICD-10-CM | POA: Diagnosis not present

## 2019-05-07 DIAGNOSIS — Z9181 History of falling: Secondary | ICD-10-CM | POA: Diagnosis not present

## 2019-05-07 DIAGNOSIS — G912 (Idiopathic) normal pressure hydrocephalus: Secondary | ICD-10-CM | POA: Diagnosis not present

## 2019-05-07 DIAGNOSIS — Z86010 Personal history of colonic polyps: Secondary | ICD-10-CM | POA: Diagnosis not present

## 2019-05-07 DIAGNOSIS — A419 Sepsis, unspecified organism: Secondary | ICD-10-CM | POA: Diagnosis not present

## 2019-05-07 DIAGNOSIS — L03113 Cellulitis of right upper limb: Secondary | ICD-10-CM | POA: Diagnosis not present

## 2019-05-07 DIAGNOSIS — M1991 Primary osteoarthritis, unspecified site: Secondary | ICD-10-CM | POA: Diagnosis not present

## 2019-05-07 DIAGNOSIS — I5032 Chronic diastolic (congestive) heart failure: Secondary | ICD-10-CM | POA: Diagnosis not present

## 2019-05-07 DIAGNOSIS — D631 Anemia in chronic kidney disease: Secondary | ICD-10-CM | POA: Diagnosis not present

## 2019-05-07 DIAGNOSIS — E559 Vitamin D deficiency, unspecified: Secondary | ICD-10-CM | POA: Diagnosis not present

## 2019-05-11 DIAGNOSIS — I10 Essential (primary) hypertension: Secondary | ICD-10-CM | POA: Diagnosis not present

## 2019-05-11 DIAGNOSIS — M1990 Unspecified osteoarthritis, unspecified site: Secondary | ICD-10-CM | POA: Diagnosis not present

## 2019-05-11 DIAGNOSIS — N183 Chronic kidney disease, stage 3 (moderate): Secondary | ICD-10-CM | POA: Diagnosis not present

## 2019-05-11 DIAGNOSIS — G894 Chronic pain syndrome: Secondary | ICD-10-CM | POA: Diagnosis not present

## 2019-05-11 DIAGNOSIS — E1140 Type 2 diabetes mellitus with diabetic neuropathy, unspecified: Secondary | ICD-10-CM | POA: Diagnosis not present

## 2019-05-12 DIAGNOSIS — E1140 Type 2 diabetes mellitus with diabetic neuropathy, unspecified: Secondary | ICD-10-CM | POA: Diagnosis not present

## 2019-05-12 DIAGNOSIS — K5900 Constipation, unspecified: Secondary | ICD-10-CM | POA: Diagnosis not present

## 2019-05-12 DIAGNOSIS — L03113 Cellulitis of right upper limb: Secondary | ICD-10-CM | POA: Diagnosis not present

## 2019-05-12 DIAGNOSIS — E1122 Type 2 diabetes mellitus with diabetic chronic kidney disease: Secondary | ICD-10-CM | POA: Diagnosis not present

## 2019-05-12 DIAGNOSIS — Z7984 Long term (current) use of oral hypoglycemic drugs: Secondary | ICD-10-CM | POA: Diagnosis not present

## 2019-05-12 DIAGNOSIS — I130 Hypertensive heart and chronic kidney disease with heart failure and stage 1 through stage 4 chronic kidney disease, or unspecified chronic kidney disease: Secondary | ICD-10-CM | POA: Diagnosis not present

## 2019-05-12 DIAGNOSIS — A419 Sepsis, unspecified organism: Secondary | ICD-10-CM | POA: Diagnosis not present

## 2019-05-12 DIAGNOSIS — Z955 Presence of coronary angioplasty implant and graft: Secondary | ICD-10-CM | POA: Diagnosis not present

## 2019-05-12 DIAGNOSIS — D631 Anemia in chronic kidney disease: Secondary | ICD-10-CM | POA: Diagnosis not present

## 2019-05-12 DIAGNOSIS — N184 Chronic kidney disease, stage 4 (severe): Secondary | ICD-10-CM | POA: Diagnosis not present

## 2019-05-12 DIAGNOSIS — M1991 Primary osteoarthritis, unspecified site: Secondary | ICD-10-CM | POA: Diagnosis not present

## 2019-05-12 DIAGNOSIS — Z96653 Presence of artificial knee joint, bilateral: Secondary | ICD-10-CM | POA: Diagnosis not present

## 2019-05-12 DIAGNOSIS — E1151 Type 2 diabetes mellitus with diabetic peripheral angiopathy without gangrene: Secondary | ICD-10-CM | POA: Diagnosis not present

## 2019-05-12 DIAGNOSIS — I2510 Atherosclerotic heart disease of native coronary artery without angina pectoris: Secondary | ICD-10-CM | POA: Diagnosis not present

## 2019-05-12 DIAGNOSIS — Z9181 History of falling: Secondary | ICD-10-CM | POA: Diagnosis not present

## 2019-05-12 DIAGNOSIS — I5032 Chronic diastolic (congestive) heart failure: Secondary | ICD-10-CM | POA: Diagnosis not present

## 2019-05-12 DIAGNOSIS — G912 (Idiopathic) normal pressure hydrocephalus: Secondary | ICD-10-CM | POA: Diagnosis not present

## 2019-05-12 DIAGNOSIS — Z86010 Personal history of colonic polyps: Secondary | ICD-10-CM | POA: Diagnosis not present

## 2019-05-12 DIAGNOSIS — Z7982 Long term (current) use of aspirin: Secondary | ICD-10-CM | POA: Diagnosis not present

## 2019-05-12 DIAGNOSIS — E559 Vitamin D deficiency, unspecified: Secondary | ICD-10-CM | POA: Diagnosis not present

## 2019-05-14 DIAGNOSIS — E1140 Type 2 diabetes mellitus with diabetic neuropathy, unspecified: Secondary | ICD-10-CM | POA: Diagnosis not present

## 2019-05-14 DIAGNOSIS — E559 Vitamin D deficiency, unspecified: Secondary | ICD-10-CM | POA: Diagnosis not present

## 2019-05-14 DIAGNOSIS — Z7982 Long term (current) use of aspirin: Secondary | ICD-10-CM | POA: Diagnosis not present

## 2019-05-14 DIAGNOSIS — Z9181 History of falling: Secondary | ICD-10-CM | POA: Diagnosis not present

## 2019-05-14 DIAGNOSIS — E1122 Type 2 diabetes mellitus with diabetic chronic kidney disease: Secondary | ICD-10-CM | POA: Diagnosis not present

## 2019-05-14 DIAGNOSIS — K5900 Constipation, unspecified: Secondary | ICD-10-CM | POA: Diagnosis not present

## 2019-05-14 DIAGNOSIS — I130 Hypertensive heart and chronic kidney disease with heart failure and stage 1 through stage 4 chronic kidney disease, or unspecified chronic kidney disease: Secondary | ICD-10-CM | POA: Diagnosis not present

## 2019-05-14 DIAGNOSIS — I2510 Atherosclerotic heart disease of native coronary artery without angina pectoris: Secondary | ICD-10-CM | POA: Diagnosis not present

## 2019-05-14 DIAGNOSIS — L03113 Cellulitis of right upper limb: Secondary | ICD-10-CM | POA: Diagnosis not present

## 2019-05-14 DIAGNOSIS — Z96653 Presence of artificial knee joint, bilateral: Secondary | ICD-10-CM | POA: Diagnosis not present

## 2019-05-14 DIAGNOSIS — D631 Anemia in chronic kidney disease: Secondary | ICD-10-CM | POA: Diagnosis not present

## 2019-05-14 DIAGNOSIS — G912 (Idiopathic) normal pressure hydrocephalus: Secondary | ICD-10-CM | POA: Diagnosis not present

## 2019-05-14 DIAGNOSIS — E1151 Type 2 diabetes mellitus with diabetic peripheral angiopathy without gangrene: Secondary | ICD-10-CM | POA: Diagnosis not present

## 2019-05-14 DIAGNOSIS — A419 Sepsis, unspecified organism: Secondary | ICD-10-CM | POA: Diagnosis not present

## 2019-05-14 DIAGNOSIS — M1991 Primary osteoarthritis, unspecified site: Secondary | ICD-10-CM | POA: Diagnosis not present

## 2019-05-14 DIAGNOSIS — Z955 Presence of coronary angioplasty implant and graft: Secondary | ICD-10-CM | POA: Diagnosis not present

## 2019-05-14 DIAGNOSIS — I5032 Chronic diastolic (congestive) heart failure: Secondary | ICD-10-CM | POA: Diagnosis not present

## 2019-05-14 DIAGNOSIS — N184 Chronic kidney disease, stage 4 (severe): Secondary | ICD-10-CM | POA: Diagnosis not present

## 2019-05-14 DIAGNOSIS — Z86010 Personal history of colonic polyps: Secondary | ICD-10-CM | POA: Diagnosis not present

## 2019-05-14 DIAGNOSIS — Z7984 Long term (current) use of oral hypoglycemic drugs: Secondary | ICD-10-CM | POA: Diagnosis not present

## 2019-05-18 ENCOUNTER — Other Ambulatory Visit: Payer: Self-pay

## 2019-05-18 NOTE — Patient Outreach (Signed)
[facility]) Care Management  05/18/2019  [name] 1946/05/26 [id number]   Medication Adherence call to [name] [facility] Identifiers Verify spoke with patient he is due on Pravastatin 20 mg and Januvia 25 mg he explain he is still taking 1 tablet daily on both medication,patient has been in the hospital and at a rehab and they have provide it with all of his medication. [name] is showing past due under [place].   [place] Management Direct Dial [phone]  Fax [phone] [email or web address]

## 2019-05-19 DIAGNOSIS — N184 Chronic kidney disease, stage 4 (severe): Secondary | ICD-10-CM | POA: Diagnosis not present

## 2019-05-19 DIAGNOSIS — D631 Anemia in chronic kidney disease: Secondary | ICD-10-CM | POA: Diagnosis not present

## 2019-05-19 DIAGNOSIS — I2510 Atherosclerotic heart disease of native coronary artery without angina pectoris: Secondary | ICD-10-CM | POA: Diagnosis not present

## 2019-05-19 DIAGNOSIS — Z86010 Personal history of colonic polyps: Secondary | ICD-10-CM | POA: Diagnosis not present

## 2019-05-19 DIAGNOSIS — M1991 Primary osteoarthritis, unspecified site: Secondary | ICD-10-CM | POA: Diagnosis not present

## 2019-05-19 DIAGNOSIS — E1122 Type 2 diabetes mellitus with diabetic chronic kidney disease: Secondary | ICD-10-CM | POA: Diagnosis not present

## 2019-05-19 DIAGNOSIS — E559 Vitamin D deficiency, unspecified: Secondary | ICD-10-CM | POA: Diagnosis not present

## 2019-05-19 DIAGNOSIS — Z9181 History of falling: Secondary | ICD-10-CM | POA: Diagnosis not present

## 2019-05-19 DIAGNOSIS — L03113 Cellulitis of right upper limb: Secondary | ICD-10-CM | POA: Diagnosis not present

## 2019-05-19 DIAGNOSIS — E1151 Type 2 diabetes mellitus with diabetic peripheral angiopathy without gangrene: Secondary | ICD-10-CM | POA: Diagnosis not present

## 2019-05-19 DIAGNOSIS — I5032 Chronic diastolic (congestive) heart failure: Secondary | ICD-10-CM | POA: Diagnosis not present

## 2019-05-19 DIAGNOSIS — I130 Hypertensive heart and chronic kidney disease with heart failure and stage 1 through stage 4 chronic kidney disease, or unspecified chronic kidney disease: Secondary | ICD-10-CM | POA: Diagnosis not present

## 2019-05-19 DIAGNOSIS — A419 Sepsis, unspecified organism: Secondary | ICD-10-CM | POA: Diagnosis not present

## 2019-05-19 DIAGNOSIS — K5900 Constipation, unspecified: Secondary | ICD-10-CM | POA: Diagnosis not present

## 2019-05-19 DIAGNOSIS — Z7982 Long term (current) use of aspirin: Secondary | ICD-10-CM | POA: Diagnosis not present

## 2019-05-19 DIAGNOSIS — Z96653 Presence of artificial knee joint, bilateral: Secondary | ICD-10-CM | POA: Diagnosis not present

## 2019-05-19 DIAGNOSIS — Z955 Presence of coronary angioplasty implant and graft: Secondary | ICD-10-CM | POA: Diagnosis not present

## 2019-05-19 DIAGNOSIS — G912 (Idiopathic) normal pressure hydrocephalus: Secondary | ICD-10-CM | POA: Diagnosis not present

## 2019-05-19 DIAGNOSIS — Z7984 Long term (current) use of oral hypoglycemic drugs: Secondary | ICD-10-CM | POA: Diagnosis not present

## 2019-05-19 DIAGNOSIS — E1140 Type 2 diabetes mellitus with diabetic neuropathy, unspecified: Secondary | ICD-10-CM | POA: Diagnosis not present

## 2019-05-25 DIAGNOSIS — E1122 Type 2 diabetes mellitus with diabetic chronic kidney disease: Secondary | ICD-10-CM | POA: Diagnosis not present

## 2019-05-25 DIAGNOSIS — I130 Hypertensive heart and chronic kidney disease with heart failure and stage 1 through stage 4 chronic kidney disease, or unspecified chronic kidney disease: Secondary | ICD-10-CM | POA: Diagnosis not present

## 2019-05-25 DIAGNOSIS — Z86010 Personal history of colonic polyps: Secondary | ICD-10-CM | POA: Diagnosis not present

## 2019-05-25 DIAGNOSIS — N184 Chronic kidney disease, stage 4 (severe): Secondary | ICD-10-CM | POA: Diagnosis not present

## 2019-05-25 DIAGNOSIS — Z7982 Long term (current) use of aspirin: Secondary | ICD-10-CM | POA: Diagnosis not present

## 2019-05-25 DIAGNOSIS — Z9181 History of falling: Secondary | ICD-10-CM | POA: Diagnosis not present

## 2019-05-25 DIAGNOSIS — A419 Sepsis, unspecified organism: Secondary | ICD-10-CM | POA: Diagnosis not present

## 2019-05-25 DIAGNOSIS — Z96653 Presence of artificial knee joint, bilateral: Secondary | ICD-10-CM | POA: Diagnosis not present

## 2019-05-25 DIAGNOSIS — E1140 Type 2 diabetes mellitus with diabetic neuropathy, unspecified: Secondary | ICD-10-CM | POA: Diagnosis not present

## 2019-05-25 DIAGNOSIS — K5900 Constipation, unspecified: Secondary | ICD-10-CM | POA: Diagnosis not present

## 2019-05-25 DIAGNOSIS — D631 Anemia in chronic kidney disease: Secondary | ICD-10-CM | POA: Diagnosis not present

## 2019-05-25 DIAGNOSIS — E1151 Type 2 diabetes mellitus with diabetic peripheral angiopathy without gangrene: Secondary | ICD-10-CM | POA: Diagnosis not present

## 2019-05-25 DIAGNOSIS — I5032 Chronic diastolic (congestive) heart failure: Secondary | ICD-10-CM | POA: Diagnosis not present

## 2019-05-25 DIAGNOSIS — Z7984 Long term (current) use of oral hypoglycemic drugs: Secondary | ICD-10-CM | POA: Diagnosis not present

## 2019-05-25 DIAGNOSIS — I2510 Atherosclerotic heart disease of native coronary artery without angina pectoris: Secondary | ICD-10-CM | POA: Diagnosis not present

## 2019-05-25 DIAGNOSIS — M1991 Primary osteoarthritis, unspecified site: Secondary | ICD-10-CM | POA: Diagnosis not present

## 2019-05-25 DIAGNOSIS — Z955 Presence of coronary angioplasty implant and graft: Secondary | ICD-10-CM | POA: Diagnosis not present

## 2019-05-25 DIAGNOSIS — G912 (Idiopathic) normal pressure hydrocephalus: Secondary | ICD-10-CM | POA: Diagnosis not present

## 2019-05-25 DIAGNOSIS — E559 Vitamin D deficiency, unspecified: Secondary | ICD-10-CM | POA: Diagnosis not present

## 2019-05-25 DIAGNOSIS — L03113 Cellulitis of right upper limb: Secondary | ICD-10-CM | POA: Diagnosis not present

## 2019-05-27 DIAGNOSIS — Z7982 Long term (current) use of aspirin: Secondary | ICD-10-CM | POA: Diagnosis not present

## 2019-05-27 DIAGNOSIS — K5900 Constipation, unspecified: Secondary | ICD-10-CM | POA: Diagnosis not present

## 2019-05-27 DIAGNOSIS — R600 Localized edema: Secondary | ICD-10-CM | POA: Diagnosis not present

## 2019-05-27 DIAGNOSIS — E559 Vitamin D deficiency, unspecified: Secondary | ICD-10-CM | POA: Diagnosis not present

## 2019-05-27 DIAGNOSIS — N183 Chronic kidney disease, stage 3 (moderate): Secondary | ICD-10-CM | POA: Diagnosis not present

## 2019-05-27 DIAGNOSIS — Z86010 Personal history of colonic polyps: Secondary | ICD-10-CM | POA: Diagnosis not present

## 2019-05-27 DIAGNOSIS — L03113 Cellulitis of right upper limb: Secondary | ICD-10-CM | POA: Diagnosis not present

## 2019-05-27 DIAGNOSIS — G894 Chronic pain syndrome: Secondary | ICD-10-CM | POA: Diagnosis not present

## 2019-05-27 DIAGNOSIS — Z9181 History of falling: Secondary | ICD-10-CM | POA: Diagnosis not present

## 2019-05-27 DIAGNOSIS — I872 Venous insufficiency (chronic) (peripheral): Secondary | ICD-10-CM | POA: Diagnosis not present

## 2019-05-27 DIAGNOSIS — M1991 Primary osteoarthritis, unspecified site: Secondary | ICD-10-CM | POA: Diagnosis not present

## 2019-05-27 DIAGNOSIS — D631 Anemia in chronic kidney disease: Secondary | ICD-10-CM | POA: Diagnosis not present

## 2019-05-27 DIAGNOSIS — E1129 Type 2 diabetes mellitus with other diabetic kidney complication: Secondary | ICD-10-CM | POA: Diagnosis not present

## 2019-05-27 DIAGNOSIS — I2510 Atherosclerotic heart disease of native coronary artery without angina pectoris: Secondary | ICD-10-CM | POA: Diagnosis not present

## 2019-05-27 DIAGNOSIS — Z96653 Presence of artificial knee joint, bilateral: Secondary | ICD-10-CM | POA: Diagnosis not present

## 2019-05-27 DIAGNOSIS — G912 (Idiopathic) normal pressure hydrocephalus: Secondary | ICD-10-CM | POA: Diagnosis not present

## 2019-05-27 DIAGNOSIS — I5032 Chronic diastolic (congestive) heart failure: Secondary | ICD-10-CM | POA: Diagnosis not present

## 2019-05-27 DIAGNOSIS — Z955 Presence of coronary angioplasty implant and graft: Secondary | ICD-10-CM | POA: Diagnosis not present

## 2019-05-27 DIAGNOSIS — E1151 Type 2 diabetes mellitus with diabetic peripheral angiopathy without gangrene: Secondary | ICD-10-CM | POA: Diagnosis not present

## 2019-05-27 DIAGNOSIS — Z7984 Long term (current) use of oral hypoglycemic drugs: Secondary | ICD-10-CM | POA: Diagnosis not present

## 2019-05-27 DIAGNOSIS — E1140 Type 2 diabetes mellitus with diabetic neuropathy, unspecified: Secondary | ICD-10-CM | POA: Diagnosis not present

## 2019-05-27 DIAGNOSIS — N184 Chronic kidney disease, stage 4 (severe): Secondary | ICD-10-CM | POA: Diagnosis not present

## 2019-05-27 DIAGNOSIS — I130 Hypertensive heart and chronic kidney disease with heart failure and stage 1 through stage 4 chronic kidney disease, or unspecified chronic kidney disease: Secondary | ICD-10-CM | POA: Diagnosis not present

## 2019-05-27 DIAGNOSIS — A419 Sepsis, unspecified organism: Secondary | ICD-10-CM | POA: Diagnosis not present

## 2019-05-27 DIAGNOSIS — E1122 Type 2 diabetes mellitus with diabetic chronic kidney disease: Secondary | ICD-10-CM | POA: Diagnosis not present

## 2019-06-02 DIAGNOSIS — Z7982 Long term (current) use of aspirin: Secondary | ICD-10-CM | POA: Diagnosis not present

## 2019-06-02 DIAGNOSIS — K5900 Constipation, unspecified: Secondary | ICD-10-CM | POA: Diagnosis not present

## 2019-06-02 DIAGNOSIS — G912 (Idiopathic) normal pressure hydrocephalus: Secondary | ICD-10-CM | POA: Diagnosis not present

## 2019-06-02 DIAGNOSIS — E1140 Type 2 diabetes mellitus with diabetic neuropathy, unspecified: Secondary | ICD-10-CM | POA: Diagnosis not present

## 2019-06-02 DIAGNOSIS — D631 Anemia in chronic kidney disease: Secondary | ICD-10-CM | POA: Diagnosis not present

## 2019-06-02 DIAGNOSIS — E1122 Type 2 diabetes mellitus with diabetic chronic kidney disease: Secondary | ICD-10-CM | POA: Diagnosis not present

## 2019-06-02 DIAGNOSIS — I2510 Atherosclerotic heart disease of native coronary artery without angina pectoris: Secondary | ICD-10-CM | POA: Diagnosis not present

## 2019-06-02 DIAGNOSIS — M1991 Primary osteoarthritis, unspecified site: Secondary | ICD-10-CM | POA: Diagnosis not present

## 2019-06-02 DIAGNOSIS — L03113 Cellulitis of right upper limb: Secondary | ICD-10-CM | POA: Diagnosis not present

## 2019-06-02 DIAGNOSIS — I130 Hypertensive heart and chronic kidney disease with heart failure and stage 1 through stage 4 chronic kidney disease, or unspecified chronic kidney disease: Secondary | ICD-10-CM | POA: Diagnosis not present

## 2019-06-02 DIAGNOSIS — Z955 Presence of coronary angioplasty implant and graft: Secondary | ICD-10-CM | POA: Diagnosis not present

## 2019-06-02 DIAGNOSIS — Z86010 Personal history of colonic polyps: Secondary | ICD-10-CM | POA: Diagnosis not present

## 2019-06-02 DIAGNOSIS — Z7984 Long term (current) use of oral hypoglycemic drugs: Secondary | ICD-10-CM | POA: Diagnosis not present

## 2019-06-02 DIAGNOSIS — I5032 Chronic diastolic (congestive) heart failure: Secondary | ICD-10-CM | POA: Diagnosis not present

## 2019-06-02 DIAGNOSIS — Z96653 Presence of artificial knee joint, bilateral: Secondary | ICD-10-CM | POA: Diagnosis not present

## 2019-06-02 DIAGNOSIS — E1151 Type 2 diabetes mellitus with diabetic peripheral angiopathy without gangrene: Secondary | ICD-10-CM | POA: Diagnosis not present

## 2019-06-02 DIAGNOSIS — N184 Chronic kidney disease, stage 4 (severe): Secondary | ICD-10-CM | POA: Diagnosis not present

## 2019-06-02 DIAGNOSIS — A419 Sepsis, unspecified organism: Secondary | ICD-10-CM | POA: Diagnosis not present

## 2019-06-02 DIAGNOSIS — E559 Vitamin D deficiency, unspecified: Secondary | ICD-10-CM | POA: Diagnosis not present

## 2019-06-02 DIAGNOSIS — Z9181 History of falling: Secondary | ICD-10-CM | POA: Diagnosis not present

## 2019-06-08 DIAGNOSIS — Z1389 Encounter for screening for other disorder: Secondary | ICD-10-CM | POA: Diagnosis not present

## 2019-06-08 DIAGNOSIS — E1140 Type 2 diabetes mellitus with diabetic neuropathy, unspecified: Secondary | ICD-10-CM | POA: Diagnosis not present

## 2019-06-08 DIAGNOSIS — E1129 Type 2 diabetes mellitus with other diabetic kidney complication: Secondary | ICD-10-CM | POA: Diagnosis not present

## 2019-06-08 DIAGNOSIS — M5136 Other intervertebral disc degeneration, lumbar region: Secondary | ICD-10-CM | POA: Diagnosis not present

## 2019-06-21 DIAGNOSIS — M1991 Primary osteoarthritis, unspecified site: Secondary | ICD-10-CM | POA: Diagnosis not present

## 2019-06-21 DIAGNOSIS — G912 (Idiopathic) normal pressure hydrocephalus: Secondary | ICD-10-CM | POA: Diagnosis not present

## 2019-06-21 DIAGNOSIS — Z955 Presence of coronary angioplasty implant and graft: Secondary | ICD-10-CM | POA: Diagnosis not present

## 2019-06-21 DIAGNOSIS — E1122 Type 2 diabetes mellitus with diabetic chronic kidney disease: Secondary | ICD-10-CM | POA: Diagnosis not present

## 2019-06-21 DIAGNOSIS — I5032 Chronic diastolic (congestive) heart failure: Secondary | ICD-10-CM | POA: Diagnosis not present

## 2019-06-21 DIAGNOSIS — E1151 Type 2 diabetes mellitus with diabetic peripheral angiopathy without gangrene: Secondary | ICD-10-CM | POA: Diagnosis not present

## 2019-06-21 DIAGNOSIS — E559 Vitamin D deficiency, unspecified: Secondary | ICD-10-CM | POA: Diagnosis not present

## 2019-06-21 DIAGNOSIS — K5900 Constipation, unspecified: Secondary | ICD-10-CM | POA: Diagnosis not present

## 2019-06-21 DIAGNOSIS — Z86010 Personal history of colonic polyps: Secondary | ICD-10-CM | POA: Diagnosis not present

## 2019-06-21 DIAGNOSIS — Z7982 Long term (current) use of aspirin: Secondary | ICD-10-CM | POA: Diagnosis not present

## 2019-06-21 DIAGNOSIS — Z96653 Presence of artificial knee joint, bilateral: Secondary | ICD-10-CM | POA: Diagnosis not present

## 2019-06-21 DIAGNOSIS — D631 Anemia in chronic kidney disease: Secondary | ICD-10-CM | POA: Diagnosis not present

## 2019-06-21 DIAGNOSIS — E1140 Type 2 diabetes mellitus with diabetic neuropathy, unspecified: Secondary | ICD-10-CM | POA: Diagnosis not present

## 2019-06-21 DIAGNOSIS — Z7984 Long term (current) use of oral hypoglycemic drugs: Secondary | ICD-10-CM | POA: Diagnosis not present

## 2019-06-21 DIAGNOSIS — N184 Chronic kidney disease, stage 4 (severe): Secondary | ICD-10-CM | POA: Diagnosis not present

## 2019-06-21 DIAGNOSIS — Z9181 History of falling: Secondary | ICD-10-CM | POA: Diagnosis not present

## 2019-06-21 DIAGNOSIS — A419 Sepsis, unspecified organism: Secondary | ICD-10-CM | POA: Diagnosis not present

## 2019-06-21 DIAGNOSIS — I2510 Atherosclerotic heart disease of native coronary artery without angina pectoris: Secondary | ICD-10-CM | POA: Diagnosis not present

## 2019-06-21 DIAGNOSIS — L03113 Cellulitis of right upper limb: Secondary | ICD-10-CM | POA: Diagnosis not present

## 2019-06-21 DIAGNOSIS — I130 Hypertensive heart and chronic kidney disease with heart failure and stage 1 through stage 4 chronic kidney disease, or unspecified chronic kidney disease: Secondary | ICD-10-CM | POA: Diagnosis not present

## 2019-07-02 ENCOUNTER — Other Ambulatory Visit: Payer: Self-pay

## 2019-07-02 DIAGNOSIS — R6889 Other general symptoms and signs: Secondary | ICD-10-CM | POA: Diagnosis not present

## 2019-07-02 DIAGNOSIS — Z20822 Contact with and (suspected) exposure to covid-19: Secondary | ICD-10-CM

## 2019-07-05 DIAGNOSIS — M159 Polyosteoarthritis, unspecified: Secondary | ICD-10-CM | POA: Diagnosis not present

## 2019-07-05 DIAGNOSIS — M5136 Other intervertebral disc degeneration, lumbar region: Secondary | ICD-10-CM | POA: Diagnosis not present

## 2019-07-05 DIAGNOSIS — G894 Chronic pain syndrome: Secondary | ICD-10-CM | POA: Diagnosis not present

## 2019-07-05 DIAGNOSIS — E1140 Type 2 diabetes mellitus with diabetic neuropathy, unspecified: Secondary | ICD-10-CM | POA: Diagnosis not present

## 2019-07-08 LAB — NOVEL CORONAVIRUS, NAA: SARS-CoV-2, NAA: NOT DETECTED

## 2019-07-14 ENCOUNTER — Telehealth: Payer: Self-pay | Admitting: General Practice

## 2019-07-14 NOTE — Telephone Encounter (Signed)
Negative results was given to pt

## 2019-07-21 DIAGNOSIS — M542 Cervicalgia: Secondary | ICD-10-CM | POA: Diagnosis not present

## 2019-07-22 DIAGNOSIS — E1121 Type 2 diabetes mellitus with diabetic nephropathy: Secondary | ICD-10-CM | POA: Diagnosis not present

## 2019-07-22 DIAGNOSIS — N183 Chronic kidney disease, stage 3 (moderate): Secondary | ICD-10-CM | POA: Diagnosis not present

## 2019-08-05 DIAGNOSIS — R42 Dizziness and giddiness: Secondary | ICD-10-CM | POA: Diagnosis not present

## 2019-08-05 DIAGNOSIS — M159 Polyosteoarthritis, unspecified: Secondary | ICD-10-CM | POA: Diagnosis not present

## 2019-08-05 DIAGNOSIS — G894 Chronic pain syndrome: Secondary | ICD-10-CM | POA: Diagnosis not present

## 2019-08-09 ENCOUNTER — Telehealth: Payer: Self-pay | Admitting: Cardiovascular Disease

## 2019-08-09 ENCOUNTER — Other Ambulatory Visit: Payer: Self-pay | Admitting: Cardiovascular Disease

## 2019-08-09 DIAGNOSIS — E1122 Type 2 diabetes mellitus with diabetic chronic kidney disease: Secondary | ICD-10-CM | POA: Diagnosis not present

## 2019-08-09 DIAGNOSIS — A419 Sepsis, unspecified organism: Secondary | ICD-10-CM | POA: Diagnosis not present

## 2019-08-09 DIAGNOSIS — I130 Hypertensive heart and chronic kidney disease with heart failure and stage 1 through stage 4 chronic kidney disease, or unspecified chronic kidney disease: Secondary | ICD-10-CM | POA: Diagnosis not present

## 2019-08-09 DIAGNOSIS — L03113 Cellulitis of right upper limb: Secondary | ICD-10-CM | POA: Diagnosis not present

## 2019-08-09 MED ORDER — FUROSEMIDE 40 MG PO TABS: 40.0000 mg | ORAL_TABLET | Freq: Two times a day (BID) | ORAL | 3 refills | Status: DC

## 2019-08-09 NOTE — Telephone Encounter (Signed)

## 2019-08-09 NOTE — Telephone Encounter (Signed)
Pt called stating he's needing a refill on his furosemide (LASIX) 40 MG tablet [[id number]]   Scheduled virtual apt w/ [facility] in Sept.

## 2019-08-09 NOTE — Telephone Encounter (Signed)
Refilled lasix

## 2019-08-10 DIAGNOSIS — M19072 Primary osteoarthritis, left ankle and foot: Secondary | ICD-10-CM | POA: Diagnosis not present

## 2019-08-16 ENCOUNTER — Ambulatory Visit (INDEPENDENT_AMBULATORY_CARE_PROVIDER_SITE_OTHER): Payer: Medicare Other | Admitting: Otolaryngology

## 2019-08-16 DIAGNOSIS — R42 Dizziness and giddiness: Secondary | ICD-10-CM | POA: Diagnosis not present

## 2019-08-16 DIAGNOSIS — H9209 Otalgia, unspecified ear: Secondary | ICD-10-CM

## 2019-08-16 DIAGNOSIS — H903 Sensorineural hearing loss, bilateral: Secondary | ICD-10-CM | POA: Diagnosis not present

## 2019-08-16 DIAGNOSIS — H6983 Other specified disorders of Eustachian tube, bilateral: Secondary | ICD-10-CM

## 2019-08-19 ENCOUNTER — Ambulatory Visit (INDEPENDENT_AMBULATORY_CARE_PROVIDER_SITE_OTHER): Payer: Medicare Other | Admitting: Otolaryngology

## 2019-08-19 DIAGNOSIS — R42 Dizziness and giddiness: Secondary | ICD-10-CM | POA: Diagnosis not present

## 2019-08-19 DIAGNOSIS — R2689 Other abnormalities of gait and mobility: Secondary | ICD-10-CM | POA: Diagnosis not present

## 2019-08-24 DIAGNOSIS — E7849 Other hyperlipidemia: Secondary | ICD-10-CM | POA: Diagnosis not present

## 2019-08-24 DIAGNOSIS — H8113 Benign paroxysmal vertigo, bilateral: Secondary | ICD-10-CM | POA: Diagnosis not present

## 2019-08-24 DIAGNOSIS — M1991 Primary osteoarthritis, unspecified site: Secondary | ICD-10-CM | POA: Diagnosis not present

## 2019-08-24 DIAGNOSIS — Z23 Encounter for immunization: Secondary | ICD-10-CM | POA: Diagnosis not present

## 2019-08-24 DIAGNOSIS — E1129 Type 2 diabetes mellitus with other diabetic kidney complication: Secondary | ICD-10-CM | POA: Diagnosis not present

## 2019-08-26 ENCOUNTER — Ambulatory Visit (INDEPENDENT_AMBULATORY_CARE_PROVIDER_SITE_OTHER): Payer: Medicare Other | Admitting: Otolaryngology

## 2019-08-26 ENCOUNTER — Other Ambulatory Visit: Payer: Self-pay

## 2019-08-26 DIAGNOSIS — R42 Dizziness and giddiness: Secondary | ICD-10-CM

## 2019-08-26 DIAGNOSIS — R2689 Other abnormalities of gait and mobility: Secondary | ICD-10-CM

## 2019-09-02 ENCOUNTER — Ambulatory Visit (INDEPENDENT_AMBULATORY_CARE_PROVIDER_SITE_OTHER): Payer: Medicare Other | Admitting: Otolaryngology

## 2019-09-02 DIAGNOSIS — R2689 Other abnormalities of gait and mobility: Secondary | ICD-10-CM | POA: Diagnosis not present

## 2019-09-02 DIAGNOSIS — R42 Dizziness and giddiness: Secondary | ICD-10-CM

## 2019-09-08 DIAGNOSIS — G894 Chronic pain syndrome: Secondary | ICD-10-CM | POA: Diagnosis not present

## 2019-09-08 DIAGNOSIS — M5136 Other intervertebral disc degeneration, lumbar region: Secondary | ICD-10-CM | POA: Diagnosis not present

## 2019-09-09 ENCOUNTER — Ambulatory Visit (INDEPENDENT_AMBULATORY_CARE_PROVIDER_SITE_OTHER): Payer: Medicare Other | Admitting: Otolaryngology

## 2019-09-09 DIAGNOSIS — R2689 Other abnormalities of gait and mobility: Secondary | ICD-10-CM | POA: Diagnosis not present

## 2019-09-09 DIAGNOSIS — R42 Dizziness and giddiness: Secondary | ICD-10-CM

## 2019-09-14 ENCOUNTER — Encounter: Payer: Self-pay | Admitting: Cardiovascular Disease

## 2019-09-14 ENCOUNTER — Other Ambulatory Visit: Payer: Self-pay

## 2019-09-14 ENCOUNTER — Telehealth: Payer: Self-pay | Admitting: Licensed Clinical Social Worker

## 2019-09-14 ENCOUNTER — Telehealth (INDEPENDENT_AMBULATORY_CARE_PROVIDER_SITE_OTHER): Payer: Medicare Other | Admitting: Cardiovascular Disease

## 2019-09-14 VITALS — BP 147/82 | HR 85 | Ht 74.0 in | Wt 285.0 lb

## 2019-09-14 DIAGNOSIS — E785 Hyperlipidemia, unspecified: Secondary | ICD-10-CM

## 2019-09-14 DIAGNOSIS — I5032 Chronic diastolic (congestive) heart failure: Secondary | ICD-10-CM

## 2019-09-14 DIAGNOSIS — I10 Essential (primary) hypertension: Secondary | ICD-10-CM

## 2019-09-14 DIAGNOSIS — I25118 Atherosclerotic heart disease of native coronary artery with other forms of angina pectoris: Secondary | ICD-10-CM

## 2019-09-14 DIAGNOSIS — Z955 Presence of coronary angioplasty implant and graft: Secondary | ICD-10-CM

## 2019-09-14 MED ORDER — PRAVASTATIN SODIUM 20 MG PO TABS: 20.0000 mg | ORAL_TABLET | Freq: Every evening | ORAL | 3 refills | Status: DC

## 2019-09-14 MED ORDER — ASPIRIN EC 81 MG PO TBEC: 81.0000 mg | DELAYED_RELEASE_TABLET | Freq: Every day | ORAL | 3 refills | Status: DC

## 2019-09-14 NOTE — Telephone Encounter (Signed)
CSW referred to assist patient with obtaining a scale and BP cuff. CSW contacted patient to inform cuff and scale will be delivered to home. Patient grateful for support and assistance. CSW available as needed. Raquel Sarna, [place], [place]

## 2019-09-14 NOTE — Progress Notes (Signed)
Virtual Visit via Telephone Note   This visit type was conducted due to national recommendations for restrictions regarding the COVID-19 Pandemic (e.g. social distancing) in an effort to limit this patient's exposure and mitigate transmission in our community.  Due to his co-morbid illnesses, this patient is at least at moderate risk for complications without adequate follow up.  This format is felt to be most appropriate for this patient at this time.  The patient did not have access to video technology/had technical difficulties with video requiring transitioning to audio format only (telephone).  All issues noted in this document were discussed and addressed.  No physical exam could be performed with this format.  Please refer to the patient's chart for his  consent to telehealth for [facility].   Date:  09/14/2019   ID:  [name], DOB 1946/07/17, MRN [id number]  Patient Location: Home Provider Location: Home  PCP:  Redmond School, MD  Cardiologist:  Kate Sable, MD  Electrophysiologist:  None   Evaluation Performed:  Follow-Up Visit  Chief Complaint:  CAD, chronic diastolic CHF  History of Present Illness:    [name] is a 73 y.o. male with a history of hypertension and hypertensive heart disease as well as coronary artery disease and sustained a NSTEMI on 11/16/2012 and received a drug-eluting stent to the LAD and D1, with residual diffuse nonobstructive disease in the RCA and LCx.  He also has CKD, sleep apnea, obesity, and chronic diastolic heart failure.  Most recent echocardiogram ion 07/06/15 demonstrated normal left ventricular systolic function, EF 49-17%, and grade 2 diastolic dysfunction.  The patient denies any symptoms of chest pain, palpitations, shortness of breath, lightheadedness, dizziness, leg swelling, orthopnea, PND, and syncope.  He has been walking and going to [name].  The patient does not have symptoms concerning for COVID-19  infection (fever, chills, cough, or new shortness of breath).    Past Medical History:  Diagnosis Date  . Anxiety   . Benign prostatic hypertrophy    Nocturia  . CAD (coronary artery disease)    a.  NSTEMI 10/2012 s/p DES to LAD & DES to 1st diagonal with residual diffuse nonobstructive dz in LCx/RCA   . Chronic kidney disease, stage 3, mod decreased GFR (HCC)    Creatinine of 1.5 in 03/2009  . Degenerative joint disease   . Depression    hx of  . Diabetes mellitus, type II ([place]) 12/10/2012.  . Diastolic heart failure    Pedal edema, "a long time ago"  . Erectile dysfunction   . Flu 2013   hx of  . GERD (gastroesophageal reflux disease)   . Hemorrhoids   . High cholesterol   . Hypertension    Exercise induced  . NSVT (nonsustained ventricular tachycardia) (HCC)    Exercise induced  . Obesity   . 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 Tubular adenoma 2014   Past Surgical History:  Procedure Laterality Date  . BACK SURGERY  2007   x2  . CARDIAC CATHETERIZATION     with stent placement  . COLONOSCOPY WITH ESOPHAGOGASTRODUODENOSCOPY (EGD) N/A 11/23/2013   Dr. Oneida Alar- TCS= left sided colitis and mild proctitis, moderate internal hemorrhoids, small nodule in the rectum, bx=tubular adenoma, EGD=-mild non-erosive gastritis  . HERNIA REPAIR    . LEFT HEART CATHETERIZATION WITH CORONARY ANGIOGRAM N/A 11/16/2012   Procedure: LEFT HEART CATHETERIZATION WITH CORONARY ANGIOGRAM;  Surgeon: [name] Mocha, MD;  Location: [facility]  CATH LAB;  Service: Cardiovascular;  Laterality: N/A;  . PERCUTANEOUS CORONARY STENT INTERVENTION (PCI-S)  11/16/2012   Procedure: PERCUTANEOUS CORONARY STENT INTERVENTION (PCI-S);  Surgeon: [name] Mocha, MD;  Location: [facility] CATH LAB;  Service: Cardiovascular;;  . TOTAL KNEE ARTHROPLASTY Right 11/15/2013   Procedure: RIGHT TOTAL KNEE ARTHROPLASTY;  Surgeon: [name] Pole, MD;  Location: [facility] ORS;  Service: Orthopedics;  Laterality: Right;  . TOTAL  KNEE ARTHROPLASTY Left 03/07/2015   Procedure: LEFT TOTAL KNEE ARTHROPLASTY;  Surgeon: [name] Cancel, MD;  Location: [facility] ORS;  Service: Orthopedics;  Laterality: Left;     Current Meds  Medication Sig  . acarbose (PRECOSE) 50 MG tablet Take 50 mg by mouth 3 (three) times daily with meals.   . calcitRIOL (ROCALTROL) 0.25 MCG capsule Take 1 capsule by mouth daily.  . cholecalciferol (VITAMIN D) 1000 units tablet Take 1,000 Units daily by mouth.  . ferrous sulfate 325 (65 FE) MG tablet Take 1 tablet (325 mg total) by mouth 3 (three) times daily after meals.  . furosemide (LASIX) 40 MG tablet Take 1 tablet (40 mg total) by mouth 2 (two) times daily.  Marland Kitchen gabapentin (NEURONTIN) 100 MG capsule Take 1 capsule (100 mg total) by mouth 3 (three) times daily for 30 days.  Marland Kitchen HYDROcodone-acetaminophen (NORCO) 10-325 MG tablet Take 1-2 tablets by mouth 4 (four) times daily as needed for moderate pain or severe pain.  Marland Kitchen lactulose (CHRONULAC) 10 GM/15ML solution Take 10 g by mouth 3 (three) times daily as needed for mild constipation.  . lidocaine (LIDODERM) 5 % Place 1 patch onto the skin daily. Remove & Discard patch within 12 hours or as directed by MD  . meclizine (ANTIVERT) 25 MG tablet Take 25 mg by mouth every 6 (six) hours as needed for dizziness.  . mupirocin ointment (BACTROBAN) 2 % Apply 1 application topically 2 (two) times daily.  . nitroGLYCERIN (NITROSTAT) 0.4 MG SL tablet PLACE ONE TABLET UNDER THE TONGUE EVERY 5 MINUTES FOR 3 DOSES AS NEEDED (Patient taking differently: Place 0.4 mg under the tongue every 5 (five) minutes as needed. )  . ondansetron (ZOFRAN ODT) 4 MG disintegrating tablet 25m ODT q4 hours prn nausea/vomit (Patient taking differently: Take 4 mg by mouth every 6 (six) hours as needed for nausea. )  . polyethylene glycol (MIRALAX / GLYCOLAX) 17 g packet Take 17 g by mouth daily.     Allergies:   Niacin and related   Social History   Tobacco Use  . Smoking status: Former Smoker     Packs/day: 1.00    Years: 30.00    Pack years: 30.00    Types: Cigarettes    Start date: 01/22/1965    Quit date: 12/23/1992    Years since quitting: 26.7  . Smokeless tobacco: Never Used  Substance Use Topics  . Alcohol use: No    Alcohol/week: 0.0 standard drinks  . Drug use: No     Family Hx: The patient's family history includes CAD in his brother; Cancer in his father and another family member; Diabetes in an other family member; Heart disease in an other family member; Hypertension in his mother and another family member. There is no history of Colon cancer.  ROS:   Please see the history of present illness.     All other systems reviewed and are negative.   Prior CV studies:   The following studies were reviewed today:  Reviewed above  Labs/Other Tests and Data Reviewed:    EKG:  No ECG reviewed.  Recent Labs: 02/19/2019: B Natriuretic Peptide 15.0 04/01/2019: ALT 19 04/02/2019: Magnesium 1.9 04/06/2019: BUN 56; Creatinine, Ser 2.18; Hemoglobin 12.2; Platelets 305; Potassium 4.7; Sodium 143   Recent Lipid Panel Lab Results  Component Value Date/Time   CHOL 121 11/15/2012 06:25 AM   TRIG 57 11/15/2012 06:25 AM   HDL 36 (L) 11/15/2012 06:25 AM   CHOLHDL 3.4 11/15/2012 06:25 AM   LDLCALC 74 11/15/2012 06:25 AM    Wt Readings from Last 3 Encounters:  09/14/19 285 lb (129.3 kg)  04/06/19 292 lb 12.3 oz (132.8 kg)  02/19/19 285 lb (129.3 kg)     Objective:    Vital Signs:  BP (!) 147/82   Pulse 85   Ht 6' 2"  (1.88 m)   Wt 285 lb (129.3 kg)   BMI 36.59 kg/m    VITAL SIGNS:  reviewed  ASSESSMENT & PLAN:    1.Chronic diastolic heart failure:On Lasix 40 mg bid. No changes.  BUN 56 and creatinine 2.18 on 04/06/2019.  I will obtain a copy of most recent labs from PCP.  2. CAD s/p PCI of LAD and D1: Stable ischemic heart disease. Continue taking aspirin 81 mg and pravastatin 20 mg daily.Metoprololpreviously stopped due to bradycardia.  3. Essential  QQI:WLNLGX elevated. No changes.  4. Hyperlipidemia: Continue pravastatin 20 mg. I will obtain a copy of lipids from PCP.    COVID-19 Education: The signs and symptoms of COVID-19 were discussed with the patient and how to seek care for testing (follow up with PCP or arrange E-visit).  The importance of social distancing was discussed today.  Time:   Today, I have spent 5 minutes with the patient with telehealth technology discussing the above problems.     Medication Adjustments/Labs and Tests Ordered: Current medicines are reviewed at length with the patient today.  Concerns regarding medicines are outlined above.   Tests Ordered: No orders of the defined types were placed in this encounter.   Medication Changes: No orders of the defined types were placed in this encounter.   Follow Up:  Virtual Visit or In Person in 6 month(s)  Signed, Kate Sable, MD  09/14/2019 11:34 AM    [place]

## 2019-09-14 NOTE — Patient Instructions (Addendum)
Medication Instructions: Your physician recommends that you continue on your current medications as directed. Please refer to the Current Medication list given to you today.   Labwork: None today  Procedures/Testing: None  Follow-Up:  6 months Virtual visit (PHONE) with Physician Assistant  Any Additional Special Instructions Will Be Listed Below (If Applicable).     If you need a refill on your cardiac medications before your next appointment, please call your pharmacy. .    Thank you for choosing [facility] !

## 2019-09-14 NOTE — Addendum Note (Signed)
Addended by: Barbarann Ehlers A on: 09/14/2019 11:48 AM   Modules accepted: Orders

## 2019-09-16 ENCOUNTER — Ambulatory Visit (INDEPENDENT_AMBULATORY_CARE_PROVIDER_SITE_OTHER): Payer: Medicare Other | Admitting: Otolaryngology

## 2019-09-16 DIAGNOSIS — M79642 Pain in left hand: Secondary | ICD-10-CM | POA: Diagnosis not present

## 2019-09-16 DIAGNOSIS — R238 Other skin changes: Secondary | ICD-10-CM | POA: Diagnosis not present

## 2019-09-16 DIAGNOSIS — R600 Localized edema: Secondary | ICD-10-CM | POA: Diagnosis not present

## 2019-09-17 ENCOUNTER — Other Ambulatory Visit: Payer: Self-pay

## 2019-09-23 ENCOUNTER — Ambulatory Visit (INDEPENDENT_AMBULATORY_CARE_PROVIDER_SITE_OTHER): Payer: Medicare Other | Admitting: Otolaryngology

## 2019-09-28 DIAGNOSIS — I872 Venous insufficiency (chronic) (peripheral): Secondary | ICD-10-CM | POA: Diagnosis not present

## 2019-09-28 DIAGNOSIS — M159 Polyosteoarthritis, unspecified: Secondary | ICD-10-CM | POA: Diagnosis not present

## 2019-09-28 DIAGNOSIS — I890 Lymphedema, not elsewhere classified: Secondary | ICD-10-CM | POA: Diagnosis not present

## 2019-10-04 ENCOUNTER — Ambulatory Visit (INDEPENDENT_AMBULATORY_CARE_PROVIDER_SITE_OTHER): Payer: Medicare Other | Admitting: Endocrinology

## 2019-10-04 ENCOUNTER — Encounter: Payer: Self-pay | Admitting: Endocrinology

## 2019-10-04 ENCOUNTER — Other Ambulatory Visit: Payer: Self-pay

## 2019-10-04 VITALS — BP 132/80 | HR 69 | Ht 74.0 in | Wt 295.2 lb

## 2019-10-04 DIAGNOSIS — E669 Obesity, unspecified: Secondary | ICD-10-CM | POA: Diagnosis not present

## 2019-10-04 DIAGNOSIS — E1169 Type 2 diabetes mellitus with other specified complication: Secondary | ICD-10-CM

## 2019-10-04 DIAGNOSIS — E1129 Type 2 diabetes mellitus with other diabetic kidney complication: Secondary | ICD-10-CM | POA: Diagnosis not present

## 2019-10-04 LAB — POCT GLYCOSYLATED HEMOGLOBIN (HGB A1C): Hemoglobin A1C: 9 % — AB (ref 4.0–5.6)

## 2019-10-04 MED ORDER — INSULIN NPH (HUMAN) (ISOPHANE) 100 UNIT/ML ~~LOC~~ SUSP: 10.0000 [IU] | SUBCUTANEOUS | 11 refills | Status: DC

## 2019-10-04 NOTE — Patient Instructions (Addendum)
good diet and exercise significantly improve the control of your diabetes.  please let me know if you wish to be referred to a dietician.  high blood sugar is very risky to your health.  you should see an eye doctor and dentist every year.  It is very important to get all recommended vaccinations.  Controlling your blood pressure and cholesterol drastically reduces the damage diabetes does to your body.  Those who smoke should quit.  Please discuss these with your doctor.  check your blood sugar twice a day.  vary the time of day when you check, between before the 3 meals, and at bedtime.  also check if you have symptoms of your blood sugar being too high or too low.  please keep a record of the readings and bring it to your next appointment here (or you can bring the meter itself).  You can write it on any piece of paper.  please call us sooner if your blood sugar goes below 70, or if you have a lot of readings over 200.  I have sent a prescription to your pharmacy, to add "NPH" insulin, 10 units each morning.   Please come back for a follow-up appointment in 2 weeks.

## 2019-10-04 NOTE — Progress Notes (Signed)
Subjective:    Patient ID: [name], male    DOB: 12/08/1946, 73 y.o.   MRN: [id number]  HPI pt is referred by Dr Collene Mares, for diabetes.  Pt states DM was dx'ed in 2010; he has mild if any neuropathy of the lower extremities; he has associated CAD and renal failure; he has never been on insulin; pt says his diet and exercise are good; he has never had pancreatitis, pancreatic surgery, severe hypoglycemia or DKA.  He takes acarbose only.  He did not tolerate metformin (nausea).  He says cbg's vary from 170-210.   Past Medical History:  Diagnosis Date  . Anxiety   . Benign prostatic hypertrophy    Nocturia  . CAD (coronary artery disease)    a.  NSTEMI 10/2012 s/p DES to LAD & DES to 1st diagonal with residual diffuse nonobstructive dz in LCx/RCA   . Chronic kidney disease, stage 3, mod decreased GFR    Creatinine of 1.5 in 03/2009  . Degenerative joint disease   . Depression    hx of  . Diabetes mellitus, type II ([place]) 12/10/2012.  . Diastolic heart failure    Pedal edema, "a long time ago"  . Erectile dysfunction   . Flu 2013   hx of  . GERD (gastroesophageal reflux disease)   . Hemorrhoids   . High cholesterol   . Hypertension    Exercise induced  . NSVT (nonsustained ventricular tachycardia) (HCC)    Exercise induced  . Obesity   . 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 Tubular adenoma 2014    Past Surgical History:  Procedure Laterality Date  . BACK SURGERY  2007   x2  . CARDIAC CATHETERIZATION     with stent placement  . COLONOSCOPY WITH ESOPHAGOGASTRODUODENOSCOPY (EGD) N/A 11/23/2013   Dr. Oneida Alar- TCS= left sided colitis and mild proctitis, moderate internal hemorrhoids, small nodule in the rectum, bx=tubular adenoma, EGD=-mild non-erosive gastritis  . HERNIA REPAIR    . LEFT HEART CATHETERIZATION WITH CORONARY ANGIOGRAM N/A 11/16/2012   Procedure: LEFT HEART CATHETERIZATION WITH CORONARY ANGIOGRAM;  Surgeon: Sherren Mocha, MD;  Location:  [facility] CATH LAB;  Service: Cardiovascular;  Laterality: N/A;  . PERCUTANEOUS CORONARY STENT INTERVENTION (PCI-S)  11/16/2012   Procedure: PERCUTANEOUS CORONARY STENT INTERVENTION (PCI-S);  Surgeon: Sherren Mocha, MD;  Location: [facility] C[facility] LAB;  Service: Cardiovascular;;  . TOTAL KNEE ARTHROPLASTY Right 11/15/2013   Procedure: RIGHT TOTAL KNEE ARTHROPLASTY;  Surgeon: Mauri Pole, MD;  Location: [facility] ORS;  Service: Orthopedics;  Laterality: Right;  . TOTAL KNEE ARTHROPLASTY Left 03/07/2015   Procedure: LEFT TOTAL KNEE ARTHROPLASTY;  Surgeon: Paralee Cancel, MD;  Location: [facility] ORS;  Service: Orthopedics;  Laterality: Left;    Social History   Socioeconomic History  . Marital status: Widowed    Spouse name: Not on file  . Number of children: Not on file  . Years of education: Not on file  . Highest education level: Not on file  Occupational History  . Occupation: Disabled    Employer: RETIRED  Social Needs  . Financial resource strain: Not on file  . Food insecurity    Worry: Not on file    Inability: Not on file  . Transportation needs    Medical: Not on file    Non-medical: Not on file  Tobacco Use  . Smoking status: Former Smoker    Packs/day: 1.00    Years: 30.00    Pack years:  30.00    Types: Cigarettes    Start date: 01/22/1965    Quit date: 12/23/1992    Years since quitting: 26.8  . Smokeless tobacco: Never Used  Substance and Sexual Activity  . Alcohol use: No    Alcohol/week: 0.0 standard drinks  . Drug use: No  . Sexual activity: Not Currently  Lifestyle  . Physical activity    Days per week: Not on file    Minutes per session: Not on file  . Stress: Not on file  Relationships  . Social Herbalist on phone: Not on file    Gets together: Not on file    Attends religious service: Not on file    Active member of club or organization: Not on file    Attends meetings of clubs or organizations: Not on file    Relationship status: Not on file  . Intimate partner  violence    Fear of current or ex partner: Not on file    Emotionally abused: Not on file    Physically abused: Not on file    Forced sexual activity: Not on file  Other Topics Concern  . Not on file  Social History Narrative   Married    Current Outpatient Medications on File Prior to Visit  Medication Sig Dispense Refill  . acarbose (PRECOSE) 50 MG tablet Take 50 mg by mouth 3 (three) times daily with meals.     Marland Kitchen aspirin EC 81 MG tablet Take 1 tablet (81 mg total) by mouth daily. 90 tablet 3  . calcitRIOL (ROCALTROL) 0.25 MCG capsule Take 1 capsule by mouth daily.    . cholecalciferol (VITAMIN D) 1000 units tablet Take 1,000 Units daily by mouth.    . ferrous sulfate 325 (65 FE) MG tablet Take 1 tablet (325 mg total) by mouth 3 (three) times daily after meals.  3  . furosemide (LASIX) 40 MG tablet Take 1 tablet (40 mg total) by mouth 2 (two) times daily. 60 tablet 3  . HYDROcodone-acetaminophen (NORCO) 10-325 MG tablet Take 1-2 tablets by mouth 4 (four) times daily as needed for moderate pain or severe pain. 10 tablet 0  . lactulose (CHRONULAC) 10 GM/15ML solution Take 10 g by mouth 3 (three) times daily as needed for mild constipation.    . lidocaine (LIDODERM) 5 % Place 1 patch onto the skin daily. Remove & Discard patch within 12 hours or as directed by MD 30 patch 0  . meclizine (ANTIVERT) 25 MG tablet Take 25 mg by mouth every 6 (six) hours as needed for dizziness.    . mupirocin ointment (BACTROBAN) 2 % Apply 1 application topically 2 (two) times daily.    . nitroGLYCERIN (NITROSTAT) 0.4 MG SL tablet PLACE ONE TABLET UNDER THE TONGUE EVERY 5 MINUTES FOR 3 DOSES AS NEEDED (Patient taking differently: Place 0.4 mg under the tongue every 5 (five) minutes as needed. ) 25 tablet 3  . ondansetron (ZOFRAN ODT) 4 MG disintegrating tablet 52m ODT q4 hours prn nausea/vomit (Patient taking differently: Take 4 mg by mouth every 6 (six) hours as needed for nausea. ) 12 tablet 0  . polyethylene  glycol (MIRALAX / GLYCOLAX) 17 g packet Take 17 g by mouth daily. 14 each 0  . pravastatin (PRAVACHOL) 20 MG tablet Take 1 tablet (20 mg total) by mouth every evening. 90 tablet 3  . gabapentin (NEURONTIN) 100 MG capsule Take 1 capsule (100 mg total) by mouth 3 (three) times daily for 30  days. 90 capsule 0   No current facility-administered medications on file prior to visit.     Allergies  Allergen Reactions  . Niacin And Related Other (See Comments)    Reaction: flushing and burning side effects even with extended release    Family History  Problem Relation Age of Onset  . Hypertension Other   . Cancer Other   . Heart disease Other   . Diabetes Other   . Hypertension Mother   . Cancer Father   . CAD Brother   . Colon cancer Neg Hx     BP 132/80 (BP Location: Left Arm, Patient Position: Sitting, Cuff Size: Large)   Pulse 69   Ht 6' 2"  (1.88 m)   Wt 295 lb 3.2 oz (133.9 kg)   SpO2 99%   BMI 37.90 kg/m     Review of Systems denies weight loss, blurry vision, headache, chest pain, sob, n/v, urinary frequency, excessive diaphoresis, memory loss, depression, cold intolerance, rhinorrhea, and easy bruising.  He has intermitt leg cramps.       Objective:   Physical Exam VS: see vs page GEN: no distress HEAD: head: no deformity eyes: no periorbital swelling, no proptosis external nose and ears are normal NECK: supple, thyroid is not enlarged.  CHEST WALL: no deformity LUNGS: clear to auscultation CV: reg rate and rhythm, no murmur ABD: abdomen is soft, nontender.  no hepatosplenomegaly.  not distended.  no hernia MUSCULOSKELETAL: muscle bulk and strength are grossly normal.  no obvious joint swelling.  gait is steady, with a cane.   EXTEMITIES: no deformity.  no ulcer on the feet.  feet are of normal color and temp.  2+ bilat leg edema.  there is bilateral onychomycosis of the toenails PULSES: dorsalis pedis intact bilat.  no carotid bruit NEURO:  cn 2-12 grossly  intact, except for disconjugate gaze.   readily moves all 4's.  sensation is intact to touch on the feet, but decreased from normal.    SKIN:  Normal texture and temperature.  No rash or suspicious lesion is visible.  Old healed surgical scars (bilat (TKR's).  Healed ulcers at the legs.   NODES:  None palpable at the neck.   PSYCH: alert, well-oriented.  Does not appear anxious nor depressed.    Lab Results  Component Value Date   HGBA1C 9.0 (A) 10/04/2019   Lab Results  Component Value Date   CREATININE 2.18 (H) 04/06/2019   BUN 56 (H) 04/06/2019   NA 143 04/06/2019   K 4.7 04/06/2019   CL 110 04/06/2019   CO2 21 (L) 04/06/2019   I have reviewed outside records, and summarized: Pt was noted to have elevated a1c, and referred here.  probs addressed were CAD, HTN, CHF, and dyslipidemia.        Assessment & Plan:  Type 2 DM, with CAD: we declines name brand meds.  He agrees to start insulin.  Renal failure, new to me.  This limits rx options.     Patient Instructions  good diet and exercise significantly improve the control of your diabetes.  please let me know if you wish to be referred to a dietician.  high blood sugar is very risky to your health.  you should see an eye doctor and dentist every year.  It is very important to get all recommended vaccinations.  Controlling your blood pressure and cholesterol drastically reduces the damage diabetes does to your body.  Those who smoke should quit.  Please discuss  these with your doctor.  check your blood sugar twice a day.  vary the time of day when you check, between before the 3 meals, and at bedtime.  also check if you have symptoms of your blood sugar being too high or too low.  please keep a record of the readings and bring it to your next appointment here (or you can bring the meter itself).  You can write it on any piece of paper.  please call us sooner if your blood sugar goes below 70, or if you have a lot of readings over 200.   I have sent a prescription to your pharmacy, to add "NPH" insulin, 10 units each morning.   Please come back for a follow-up appointment in 2 weeks.

## 2019-10-05 ENCOUNTER — Other Ambulatory Visit: Payer: Self-pay | Admitting: Cardiovascular Disease

## 2019-10-07 ENCOUNTER — Ambulatory Visit (INDEPENDENT_AMBULATORY_CARE_PROVIDER_SITE_OTHER): Payer: Medicare Other | Admitting: Otolaryngology

## 2019-10-07 DIAGNOSIS — M5136 Other intervertebral disc degeneration, lumbar region: Secondary | ICD-10-CM | POA: Diagnosis not present

## 2019-10-07 DIAGNOSIS — M545 Low back pain: Secondary | ICD-10-CM | POA: Diagnosis not present

## 2019-10-07 DIAGNOSIS — I2510 Atherosclerotic heart disease of native coronary artery without angina pectoris: Secondary | ICD-10-CM | POA: Diagnosis not present

## 2019-10-07 DIAGNOSIS — M109 Gout, unspecified: Secondary | ICD-10-CM | POA: Diagnosis not present

## 2019-10-07 DIAGNOSIS — M159 Polyosteoarthritis, unspecified: Secondary | ICD-10-CM | POA: Diagnosis not present

## 2019-10-14 ENCOUNTER — Ambulatory Visit (INDEPENDENT_AMBULATORY_CARE_PROVIDER_SITE_OTHER): Payer: Medicare Other | Admitting: Otolaryngology

## 2019-10-14 ENCOUNTER — Other Ambulatory Visit: Payer: Self-pay

## 2019-10-14 DIAGNOSIS — R42 Dizziness and giddiness: Secondary | ICD-10-CM

## 2019-10-14 DIAGNOSIS — R2689 Other abnormalities of gait and mobility: Secondary | ICD-10-CM | POA: Diagnosis not present

## 2019-10-18 ENCOUNTER — Ambulatory Visit: Payer: Medicare Other | Admitting: Endocrinology

## 2019-10-18 ENCOUNTER — Encounter: Payer: Self-pay | Admitting: Endocrinology

## 2019-10-18 ENCOUNTER — Other Ambulatory Visit: Payer: Self-pay

## 2019-10-18 ENCOUNTER — Ambulatory Visit (INDEPENDENT_AMBULATORY_CARE_PROVIDER_SITE_OTHER): Payer: Medicare Other | Admitting: Endocrinology

## 2019-10-18 VITALS — BP 152/80 | HR 57 | Ht 74.0 in | Wt 297.0 lb

## 2019-10-18 DIAGNOSIS — E669 Obesity, unspecified: Secondary | ICD-10-CM | POA: Diagnosis not present

## 2019-10-18 DIAGNOSIS — E1169 Type 2 diabetes mellitus with other specified complication: Secondary | ICD-10-CM

## 2019-10-18 MED ORDER — INSULIN NPH (HUMAN) (ISOPHANE) 100 UNIT/ML ~~LOC~~ SUSP: 14.0000 [IU] | SUBCUTANEOUS | 11 refills | Status: DC

## 2019-10-18 NOTE — Patient Instructions (Addendum)
check your blood sugar twice a day.  vary the time of day when you check, between before the 3 meals, and at bedtime.  also check if you have symptoms of your blood sugar being too high or too low.  please keep a record of the readings and bring it to your next appointment here (or you can bring the meter itself).  You can write it on any piece of paper.  please call us sooner if your blood sugar goes below 70, or if you have a lot of readings over 200.  Please increase the NPH insulin to 14 units each morning.   Please come back for a follow-up appointment in 6 weeks.

## 2019-10-18 NOTE — Progress Notes (Signed)
Subjective:    Patient ID: [name], male    DOB: [birth year], 73 y.o.   MRN: [id number]  HPI  Pt returns for f/u of diabetes mellitus: DM type: Insulin-requiring type 2 Dx'ed: 1324 Complications: CAD and renal failure Therapy: insulin since 2020 DKA: never Severe hypoglycemia: never Pancreatitis: never Pancreatic imaging: normal on 2017 CT Other: He did not tolerate metformin (nausea); he declines multiple daily injections. Interval history: she brings a record of her cbg's which I have reviewed today, checked fasting.  cbg varies from 119-185.  pt states he feels well in general.   Past Medical History:  Diagnosis Date  . Anxiety   . Benign prostatic hypertrophy    Nocturia  . CAD (coronary artery disease)    a.  NSTEMI 10/2012 s/p DES to LAD & DES to 1st diagonal with residual diffuse nonobstructive dz in LCx/RCA   . Chronic kidney disease, stage 3, mod decreased GFR    Creatinine of 1.5 in 03/2009  . Degenerative joint disease   . Depression    hx of  . Diabetes mellitus, type II ([place]) 12/10/2012.  . Diastolic heart failure    Pedal edema, "a long time ago"  . Erectile dysfunction   . Flu 2013   hx of  . GERD (gastroesophageal reflux disease)   . Hemorrhoids   . High cholesterol   . Hypertension    Exercise induced  . NSVT (nonsustained ventricular tachycardia) (HCC)    Exercise induced  . Obesity   . 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 Tubular adenoma 2014    Past Surgical History:  Procedure Laterality Date  . BACK SURGERY  2007   x2  . CARDIAC CATHETERIZATION     with stent placement  . COLONOSCOPY WITH ESOPHAGOGASTRODUODENOSCOPY (EGD) N/A 11/23/2013   Dr. Oneida Alar- TCS= left sided colitis and mild proctitis, moderate internal hemorrhoids, small nodule in the rectum, bx=tubular adenoma, EGD=-mild non-erosive gastritis  . HERNIA REPAIR    . LEFT HEART CATHETERIZATION WITH CORONARY ANGIOGRAM N/A 11/16/2012   Procedure: LEFT  HEART CATHETERIZATION WITH CORONARY ANGIOGRAM;  Surgeon: Sherren Mocha, MD;  Location: [facility] CATH LAB;  Service: Cardiovascular;  Laterality: N/A;  . PERCUTANEOUS CORONARY STENT INTERVENTION (PCI-S)  11/16/2012   Procedure: PERCUTANEOUS CORONARY STENT INTERVENTION (PCI-S);  Surgeon: Sherren Mocha, MD;  Location: [facility] CATH LAB;  Service: Cardiovascular;;  . TOTAL KNEE ARTHROPLASTY Right 11/15/2013   Procedure: RIGHT TOTAL KNEE ARTHROPLASTY;  Surgeon: Mauri Pole, MD;  Location: [facility];  Service: Orthopedics;  Laterality: Right;  . TOTAL KNEE ARTHROPLASTY Left 03/07/2015   Procedure: LEFT TOTAL KNEE ARTHROPLASTY;  Surgeon: Paralee Cancel, MD;  Location: [facility];  Service: Orthopedics;  Laterality: Left;    Social History   Socioeconomic History  . Marital status: Widowed    Spouse name: Not on file  . Number of children: Not on file  . Years of education: Not on file  . Highest education level: Not on file  Occupational History  . Occupation: Disabled    Employer: RETIRED  Social Needs  . Financial resource strain: Not on file  . Food insecurity    Worry: Not on file    Inability: Not on file  . Transportation needs    Medical: Not on file    Non-medical: Not on file  Tobacco Use  . Smoking status: Former Smoker    Packs/day: 1.00    Years: 30.00    Pack  years: 30.00    Types: Cigarettes    Start date: 01/22/1965    Quit date: 12/23/1992    Years since quitting: 26.8  . Smokeless tobacco: Never Used  Substance and Sexual Activity  . Alcohol use: No    Alcohol/week: 0.0 standard drinks  . Drug use: No  . Sexual activity: Not Currently  Lifestyle  . Physical activity    Days per week: Not on file    Minutes per session: Not on file  . Stress: Not on file  Relationships  . Social Herbalist on phone: Not on file    Gets together: Not on file    Attends religious service: Not on file    Active member of club or organization: Not on file    Attends meetings of clubs  or organizations: Not on file    Relationship status: Not on file  . Intimate partner violence    Fear of current or ex partner: Not on file    Emotionally abused: Not on file    Physically abused: Not on file    Forced sexual activity: Not on file  Other Topics Concern  . Not on file  Social History Narrative   Married    Current Outpatient Medications on File Prior to Visit  Medication Sig Dispense Refill  . acarbose (PRECOSE) 50 MG tablet Take 50 mg by mouth 3 (three) times daily with meals.     Marland Kitchen aspirin EC 81 MG tablet Take 1 tablet (81 mg total) by mouth daily. 90 tablet 3  . calcitRIOL (ROCALTROL) 0.25 MCG capsule Take 1 capsule by mouth daily.    . cholecalciferol (VITAMIN D) 1000 units tablet Take 1,000 Units daily by mouth.    . ferrous sulfate 325 (65 FE) MG tablet Take 1 tablet (325 mg total) by mouth 3 (three) times daily after meals.  3  . furosemide (LASIX) 40 MG tablet Take 1 tablet (40 mg total) by mouth 2 (two) times daily. 60 tablet 3  . HYDROcodone-acetaminophen (NORCO) 10-325 MG tablet Take 1-2 tablets by mouth 4 (four) times daily as needed for moderate pain or severe pain. 10 tablet 0  . lactulose (CHRONULAC) 10 GM/15ML solution Take 10 g by mouth 3 (three) times daily as needed for mild constipation.    . lidocaine (LIDODERM) 5 % Place 1 patch onto the skin daily. Remove & Discard patch within 12 hours or as directed by MD 30 patch 0  . meclizine (ANTIVERT) 25 MG tablet Take 25 mg by mouth every 6 (six) hours as needed for dizziness.    . mupirocin ointment (BACTROBAN) 2 % Apply 1 application topically 2 (two) times daily.    . nitroGLYCERIN (NITROSTAT) 0.4 MG SL tablet PLACE ONE TABLET UNDER THE TONGUE EVERY 5 MINUTES FOR 3 DOSES AS NEEDED (Patient taking differently: Place 0.4 mg under the tongue every 5 (five) minutes as needed. ) 25 tablet 3  . ondansetron (ZOFRAN ODT) 4 MG disintegrating tablet 27m ODT q4 hours prn nausea/vomit (Patient taking differently: Take  4 mg by mouth every 6 (six) hours as needed for nausea. ) 12 tablet 0  . polyethylene glycol (MIRALAX / GLYCOLAX) 17 g packet Take 17 g by mouth daily. 14 each 0  . pravastatin (PRAVACHOL) 20 MG tablet Take 1 tablet (20 mg total) by mouth every evening. 90 tablet 3  . gabapentin (NEURONTIN) 100 MG capsule Take 1 capsule (100 mg total) by mouth 3 (three) times daily for  30 days. 90 capsule 0   No current facility-administered medications on file prior to visit.     Allergies  Allergen Reactions  . Niacin And Related Other (See Comments)    Reaction: flushing and burning side effects even with extended release    Family History  Problem Relation Age of Onset  . Hypertension Other   . Cancer Other   . Heart disease Other   . Diabetes Other   . Hypertension Mother   . Cancer Father   . CAD Brother   . Colon cancer Neg Hx     BP (!) 152/80 (BP Location: Left Arm, Patient Position: Sitting, Cuff Size: Large)   Pulse (!) 57   Ht 6' 2"  (1.88 m)   Wt 297 lb (134.7 kg)   SpO2 98%   BMI 38.13 kg/m   Review of Systems He denies hypoglycemia    Objective:   Physical Exam VITAL SIGNS:  See vs page GENERAL: no distress Pulses: dorsalis pedis intact bilat.   MSK: no deformity of the feet CV: 2+ bilat leg edema Skin:  no ulcer on the feet, but there are several healed ulcers on the legs.  normal color and temp on the feet. Neuro: sensation is intact to touch on the feet Ext: there is bilateral onychomycosis of the toenails.   Lab Results  Component Value Date   HGBA1C 9.0 (A) 10/04/2019   Lab Results  Component Value Date   CREATININE 2.18 (H) 04/06/2019   BUN 56 (H) 04/06/2019   NA 143 04/06/2019   K 4.7 04/06/2019   CL 110 04/06/2019   CO2 21 (L) 04/06/2019        Assessment & Plan:  Insulin-requiring type 2 DM: he needs increased rx Renal failure: in this setting, he needs an intermediate-acting qam insulin.  HTN, with poss situational component: recheck next  time.   Patient Instructions  check your blood sugar twice a day.  vary the time of day when you check, between before the 3 meals, and at bedtime.  also check if you have symptoms of your blood sugar being too high or too low.  please keep a record of the readings and bring it to your next appointment here (or you can bring the meter itself).  You can write it on any piece of paper.  please call us sooner if your blood sugar goes below 70, or if you have a lot of readings over 200.  Please increase the NPH insulin to 14 units each morning.   Please come back for a follow-up appointment in 6 weeks.

## 2019-10-21 ENCOUNTER — Ambulatory Visit (INDEPENDENT_AMBULATORY_CARE_PROVIDER_SITE_OTHER): Payer: Medicare Other | Admitting: Otolaryngology

## 2019-10-22 DIAGNOSIS — I10 Essential (primary) hypertension: Secondary | ICD-10-CM | POA: Diagnosis not present

## 2019-10-22 DIAGNOSIS — E1129 Type 2 diabetes mellitus with other diabetic kidney complication: Secondary | ICD-10-CM | POA: Diagnosis not present

## 2019-10-22 DIAGNOSIS — N184 Chronic kidney disease, stage 4 (severe): Secondary | ICD-10-CM | POA: Diagnosis not present

## 2019-10-22 DIAGNOSIS — I5030 Unspecified diastolic (congestive) heart failure: Secondary | ICD-10-CM | POA: Diagnosis not present

## 2019-10-25 ENCOUNTER — Inpatient Hospital Stay (HOSPITAL_COMMUNITY)
Admission: EM | Admit: 2019-10-25 | Discharge: 2019-10-28 | DRG: 603 | Disposition: A | Discharge status: Home Health | Payer: Medicare Other | Attending: Internal Medicine | Admitting: Internal Medicine

## 2019-10-25 ENCOUNTER — Emergency Department (HOSPITAL_COMMUNITY): Payer: Medicare Other

## 2019-10-25 ENCOUNTER — Other Ambulatory Visit: Payer: Self-pay

## 2019-10-25 ENCOUNTER — Encounter (HOSPITAL_COMMUNITY): Payer: Self-pay

## 2019-10-25 DIAGNOSIS — I130 Hypertensive heart and chronic kidney disease with heart failure and stage 1 through stage 4 chronic kidney disease, or unspecified chronic kidney disease: Secondary | ICD-10-CM | POA: Diagnosis not present

## 2019-10-25 DIAGNOSIS — E860 Dehydration: Secondary | ICD-10-CM | POA: Diagnosis not present

## 2019-10-25 DIAGNOSIS — R351 Nocturia: Secondary | ICD-10-CM | POA: Diagnosis present

## 2019-10-25 DIAGNOSIS — M79641 Pain in right hand: Secondary | ICD-10-CM | POA: Diagnosis not present

## 2019-10-25 DIAGNOSIS — N401 Enlarged prostate with lower urinary tract symptoms: Secondary | ICD-10-CM | POA: Diagnosis present

## 2019-10-25 DIAGNOSIS — L03113 Cellulitis of right upper limb: Principal | ICD-10-CM | POA: Diagnosis present

## 2019-10-25 DIAGNOSIS — I2510 Atherosclerotic heart disease of native coronary artery without angina pectoris: Secondary | ICD-10-CM | POA: Diagnosis not present

## 2019-10-25 DIAGNOSIS — I10 Essential (primary) hypertension: Secondary | ICD-10-CM | POA: Diagnosis not present

## 2019-10-25 DIAGNOSIS — I5032 Chronic diastolic (congestive) heart failure: Secondary | ICD-10-CM | POA: Diagnosis not present

## 2019-10-25 DIAGNOSIS — Z833 Family history of diabetes mellitus: Secondary | ICD-10-CM

## 2019-10-25 DIAGNOSIS — I252 Old myocardial infarction: Secondary | ICD-10-CM

## 2019-10-25 DIAGNOSIS — Z20828 Contact with and (suspected) exposure to other viral communicable diseases: Secondary | ICD-10-CM | POA: Diagnosis present

## 2019-10-25 DIAGNOSIS — G4733 Obstructive sleep apnea (adult) (pediatric): Secondary | ICD-10-CM | POA: Diagnosis present

## 2019-10-25 DIAGNOSIS — M109 Gout, unspecified: Secondary | ICD-10-CM | POA: Diagnosis not present

## 2019-10-25 DIAGNOSIS — T380X5A Adverse effect of glucocorticoids and synthetic analogues, initial encounter: Secondary | ICD-10-CM | POA: Diagnosis not present

## 2019-10-25 DIAGNOSIS — D72829 Elevated white blood cell count, unspecified: Secondary | ICD-10-CM | POA: Diagnosis not present

## 2019-10-25 DIAGNOSIS — K219 Gastro-esophageal reflux disease without esophagitis: Secondary | ICD-10-CM | POA: Diagnosis present

## 2019-10-25 DIAGNOSIS — D649 Anemia, unspecified: Secondary | ICD-10-CM | POA: Diagnosis not present

## 2019-10-25 DIAGNOSIS — Z9861 Coronary angioplasty status: Secondary | ICD-10-CM | POA: Diagnosis not present

## 2019-10-25 DIAGNOSIS — E785 Hyperlipidemia, unspecified: Secondary | ICD-10-CM | POA: Diagnosis present

## 2019-10-25 DIAGNOSIS — E669 Obesity, unspecified: Secondary | ICD-10-CM | POA: Diagnosis present

## 2019-10-25 DIAGNOSIS — E1122 Type 2 diabetes mellitus with diabetic chronic kidney disease: Secondary | ICD-10-CM | POA: Diagnosis present

## 2019-10-25 DIAGNOSIS — Z03818 Encounter for observation for suspected exposure to other biological agents ruled out: Secondary | ICD-10-CM | POA: Diagnosis not present

## 2019-10-25 DIAGNOSIS — Z6837 Body mass index (BMI) 37.0-37.9, adult: Secondary | ICD-10-CM

## 2019-10-25 DIAGNOSIS — I5020 Unspecified systolic (congestive) heart failure: Secondary | ICD-10-CM

## 2019-10-25 DIAGNOSIS — Z794 Long term (current) use of insulin: Secondary | ICD-10-CM

## 2019-10-25 DIAGNOSIS — Z7982 Long term (current) use of aspirin: Secondary | ICD-10-CM | POA: Diagnosis not present

## 2019-10-25 DIAGNOSIS — Z8249 Family history of ischemic heart disease and other diseases of the circulatory system: Secondary | ICD-10-CM

## 2019-10-25 DIAGNOSIS — M1990 Unspecified osteoarthritis, unspecified site: Secondary | ICD-10-CM | POA: Diagnosis present

## 2019-10-25 DIAGNOSIS — N183 Chronic kidney disease, stage 3 unspecified: Secondary | ICD-10-CM | POA: Diagnosis not present

## 2019-10-25 DIAGNOSIS — Z79899 Other long term (current) drug therapy: Secondary | ICD-10-CM | POA: Diagnosis not present

## 2019-10-25 DIAGNOSIS — Z955 Presence of coronary angioplasty implant and graft: Secondary | ICD-10-CM

## 2019-10-25 DIAGNOSIS — I5022 Chronic systolic (congestive) heart failure: Secondary | ICD-10-CM

## 2019-10-25 DIAGNOSIS — E1165 Type 2 diabetes mellitus with hyperglycemia: Secondary | ICD-10-CM | POA: Diagnosis not present

## 2019-10-25 DIAGNOSIS — Z87891 Personal history of nicotine dependence: Secondary | ICD-10-CM

## 2019-10-25 DIAGNOSIS — I509 Heart failure, unspecified: Secondary | ICD-10-CM

## 2019-10-25 LAB — SEDIMENTATION RATE
Sed Rate: 128 mm/hr — ABNORMAL HIGH (ref 0–16)
Sed Rate: 133 mm/hr — ABNORMAL HIGH (ref 0–16)

## 2019-10-25 LAB — CBC WITH DIFFERENTIAL/PLATELET
Abs Immature Granulocytes: 0.08 10*3/uL — ABNORMAL HIGH (ref 0.00–0.07)
Basophils Absolute: 0 10*3/uL (ref 0.0–0.1)
Basophils Relative: 0 %
Eosinophils Absolute: 0 10*3/uL (ref 0.0–0.5)
Eosinophils Relative: 0 %
HCT: 34.9 % — ABNORMAL LOW (ref 39.0–52.0)
Hemoglobin: 11.8 g/dL — ABNORMAL LOW (ref 13.0–17.0)
Immature Granulocytes: 1 %
Lymphocytes Relative: 6 %
Lymphs Abs: 1 10*3/uL (ref 0.7–4.0)
MCH: 32.6 pg (ref 26.0–34.0)
MCHC: 33.8 g/dL (ref 30.0–36.0)
MCV: 96.4 fL (ref 80.0–100.0)
Monocytes Absolute: 2 10*3/uL — ABNORMAL HIGH (ref 0.1–1.0)
Monocytes Relative: 12 %
Neutro Abs: 13.7 10*3/uL — ABNORMAL HIGH (ref 1.7–7.7)
Neutrophils Relative %: 81 %
Platelets: 205 10*3/uL (ref 150–400)
RBC: 3.62 MIL/uL — ABNORMAL LOW (ref 4.22–5.81)
RDW: 13.4 % (ref 11.5–15.5)
WBC: 16.7 10*3/uL — ABNORMAL HIGH (ref 4.0–10.5)
nRBC: 0 % (ref 0.0–0.2)

## 2019-10-25 LAB — BASIC METABOLIC PANEL
Anion gap: 15 (ref 5–15)
BUN: 32 mg/dL — ABNORMAL HIGH (ref 8–23)
CO2: 22 mmol/L (ref 22–32)
Calcium: 9.1 mg/dL (ref 8.9–10.3)
Chloride: 103 mmol/L (ref 98–111)
Creatinine, Ser: 2.08 mg/dL — ABNORMAL HIGH (ref 0.61–1.24)
GFR calc Af Amer: 35 mL/min — ABNORMAL LOW (ref >60)
GFR calc non Af Amer: 30 mL/min — ABNORMAL LOW (ref >60)
Glucose, Bld: 218 mg/dL — ABNORMAL HIGH (ref 70–99)
Potassium: 4.6 mmol/L (ref 3.5–5.1)
Sodium: 140 mmol/L (ref 135–145)

## 2019-10-25 LAB — C-REACTIVE PROTEIN
CRP: 38.5 mg/dL — ABNORMAL HIGH (ref <1.0)
CRP: 37.1 mg/dL — ABNORMAL HIGH (ref <1.0)

## 2019-10-25 LAB — LACTIC ACID, PLASMA
Lactic Acid, Venous: 1.1 mmol/L (ref 0.5–1.9)
Lactic Acid, Venous: 1.4 mmol/L (ref 0.5–1.9)

## 2019-10-25 LAB — TSH: TSH: 0.512 u[IU]/mL (ref 0.350–4.500)

## 2019-10-25 LAB — URIC ACID: Uric Acid, Serum: 10.4 mg/dL — ABNORMAL HIGH (ref 3.7–8.6)

## 2019-10-25 LAB — GLUCOSE, CAPILLARY
Glucose-Capillary: 278 mg/dL — ABNORMAL HIGH (ref 70–99)
Glucose-Capillary: 249 mg/dL — ABNORMAL HIGH (ref 70–99)

## 2019-10-25 LAB — CBG MONITORING, ED: Glucose-Capillary: 206 mg/dL — ABNORMAL HIGH (ref 70–99)

## 2019-10-25 MED ORDER — MECLIZINE HCL 12.5 MG PO TABS: 25.0000 mg | ORAL_TABLET | Freq: Four times a day (QID) | ORAL | Status: DC | PRN

## 2019-10-25 MED ORDER — VANCOMYCIN HCL IN DEXTROSE 1-5 GM/200ML-% IV SOLN
1000.0000 mg | INTRAVENOUS | Status: AC
  Administered 2019-10-25: 1000 mg via INTRAVENOUS

## 2019-10-25 MED ORDER — VITAMIN D 25 MCG (1000 UNIT) PO TABS
1000.0000 [IU] | ORAL_TABLET | Freq: Every day | ORAL | Status: DC
  Administered 2019-10-26 – 2019-10-28 (×3): 1000 [IU] via ORAL
  Filled 2019-10-25 (×3): qty 1

## 2019-10-25 MED ORDER — SODIUM CHLORIDE 0.9 % IV SOLN
INTRAVENOUS | Status: DC
  Administered 2019-10-25 – 2019-10-27 (×3): via INTRAVENOUS

## 2019-10-25 MED ORDER — VANCOMYCIN HCL IN DEXTROSE 1-5 GM/200ML-% IV SOLN
1000.0000 mg | Freq: Once | INTRAVENOUS | Status: DC
  Filled 2019-10-25: qty 200

## 2019-10-25 MED ORDER — ACETAMINOPHEN 325 MG PO TABS
650.0000 mg | ORAL_TABLET | Freq: Four times a day (QID) | ORAL | Status: DC | PRN
  Administered 2019-10-26 – 2019-10-27 (×2): 650 mg via ORAL
  Filled 2019-10-25 (×2): qty 2

## 2019-10-25 MED ORDER — ONDANSETRON 8 MG PO TBDP
8.0000 mg | ORAL_TABLET | Freq: Once | ORAL | Status: AC
  Administered 2019-10-25: 8 mg via ORAL
  Filled 2019-10-25: qty 1

## 2019-10-25 MED ORDER — ONDANSETRON HCL 4 MG/2ML IJ SOLN: 4.0000 mg | Freq: Four times a day (QID) | INTRAMUSCULAR | Status: DC | PRN

## 2019-10-25 MED ORDER — CALCITRIOL 0.25 MCG PO CAPS
0.2500 ug | ORAL_CAPSULE | Freq: Every day | ORAL | Status: DC
  Administered 2019-10-25 – 2019-10-28 (×4): 0.25 ug via ORAL
  Filled 2019-10-25 (×4): qty 1

## 2019-10-25 MED ORDER — HYDROCODONE-ACETAMINOPHEN 10-325 MG PO TABS
1.0000 | ORAL_TABLET | Freq: Four times a day (QID) | ORAL | Status: DC | PRN
  Administered 2019-10-25 – 2019-10-28 (×4): 1 via ORAL
  Filled 2019-10-25 (×4): qty 1

## 2019-10-25 MED ORDER — POLYETHYLENE GLYCOL 3350 17 G PO PACK
17.0000 g | PACK | Freq: Every day | ORAL | Status: DC
  Administered 2019-10-26 – 2019-10-28 (×3): 17 g via ORAL
  Filled 2019-10-25 (×3): qty 1

## 2019-10-25 MED ORDER — INSULIN ASPART 100 UNIT/ML ~~LOC~~ SOLN
0.0000 [IU] | Freq: Every day | SUBCUTANEOUS | Status: DC
  Administered 2019-10-25 – 2019-10-26 (×2): 2 [IU] via SUBCUTANEOUS

## 2019-10-25 MED ORDER — COLCHICINE 0.6 MG PO TABS
1.2000 mg | ORAL_TABLET | Freq: Once | ORAL | Status: AC
  Administered 2019-10-25: 1.2 mg via ORAL
  Filled 2019-10-25: qty 2

## 2019-10-25 MED ORDER — PRAVASTATIN SODIUM 10 MG PO TABS
20.0000 mg | ORAL_TABLET | Freq: Every evening | ORAL | Status: DC
  Administered 2019-10-25 – 2019-10-27 (×3): 20 mg via ORAL
  Filled 2019-10-25 (×3): qty 2

## 2019-10-25 MED ORDER — GABAPENTIN 100 MG PO CAPS
100.0000 mg | ORAL_CAPSULE | Freq: Three times a day (TID) | ORAL | Status: DC
  Administered 2019-10-25 – 2019-10-28 (×9): 100 mg via ORAL
  Filled 2019-10-25 (×9): qty 1

## 2019-10-25 MED ORDER — VANCOMYCIN HCL 10 G IV SOLR
1500.0000 mg | INTRAVENOUS | Status: DC
  Filled 2019-10-25: qty 1500

## 2019-10-25 MED ORDER — ALLOPURINOL 100 MG PO TABS
100.0000 mg | ORAL_TABLET | Freq: Every day | ORAL | Status: DC
  Administered 2019-10-25 – 2019-10-28 (×4): 100 mg via ORAL
  Filled 2019-10-25 (×4): qty 1

## 2019-10-25 MED ORDER — ONDANSETRON HCL 4 MG PO TABS: 4.0000 mg | ORAL_TABLET | Freq: Four times a day (QID) | ORAL | Status: DC | PRN

## 2019-10-25 MED ORDER — INSULIN ASPART 100 UNIT/ML ~~LOC~~ SOLN
0.0000 [IU] | Freq: Three times a day (TID) | SUBCUTANEOUS | Status: DC
  Administered 2019-10-25: 5 [IU] via SUBCUTANEOUS
  Administered 2019-10-26: 7 [IU] via SUBCUTANEOUS
  Administered 2019-10-26: 3 [IU] via SUBCUTANEOUS
  Administered 2019-10-26: 5 [IU] via SUBCUTANEOUS
  Administered 2019-10-27 (×3): 3 [IU] via SUBCUTANEOUS
  Administered 2019-10-28: 1 [IU] via SUBCUTANEOUS
  Administered 2019-10-28: 2 [IU] via SUBCUTANEOUS

## 2019-10-25 MED ORDER — ASPIRIN EC 81 MG PO TBEC
81.0000 mg | DELAYED_RELEASE_TABLET | Freq: Every day | ORAL | Status: DC
  Administered 2019-10-25 – 2019-10-28 (×4): 81 mg via ORAL
  Filled 2019-10-25 (×4): qty 1

## 2019-10-25 MED ORDER — INSULIN ASPART 100 UNIT/ML ~~LOC~~ SOLN
4.0000 [IU] | Freq: Three times a day (TID) | SUBCUTANEOUS | Status: DC
  Administered 2019-10-25 – 2019-10-26 (×3): 4 [IU] via SUBCUTANEOUS

## 2019-10-25 MED ORDER — ACETAMINOPHEN 650 MG RE SUPP: 650.0000 mg | Freq: Four times a day (QID) | RECTAL | Status: DC | PRN

## 2019-10-25 MED ORDER — METHYLPREDNISOLONE SODIUM SUCC 40 MG IJ SOLR
40.0000 mg | Freq: Two times a day (BID) | INTRAMUSCULAR | Status: DC
  Administered 2019-10-25 – 2019-10-26 (×2): 40 mg via INTRAVENOUS
  Filled 2019-10-25 (×2): qty 1

## 2019-10-25 MED ORDER — ENOXAPARIN SODIUM 30 MG/0.3ML ~~LOC~~ SOLN: 30.0000 mg | SUBCUTANEOUS | Status: DC

## 2019-10-25 MED ORDER — METHYLPREDNISOLONE SODIUM SUCC 125 MG IJ SOLR
80.0000 mg | Freq: Once | INTRAMUSCULAR | Status: AC
  Administered 2019-10-25: 80 mg via INTRAMUSCULAR
  Filled 2019-10-25: qty 2

## 2019-10-25 MED ORDER — HYDROMORPHONE HCL 1 MG/ML IJ SOLN
1.0000 mg | Freq: Once | INTRAMUSCULAR | Status: AC
  Administered 2019-10-25: 1 mg via INTRAMUSCULAR
  Filled 2019-10-25: qty 1

## 2019-10-25 MED ORDER — ACETAMINOPHEN 325 MG PO TABS
650.0000 mg | ORAL_TABLET | Freq: Once | ORAL | Status: AC
  Administered 2019-10-25: 650 mg via ORAL
  Filled 2019-10-25: qty 2

## 2019-10-25 MED ORDER — PANTOPRAZOLE SODIUM 40 MG PO TBEC
40.0000 mg | DELAYED_RELEASE_TABLET | Freq: Every day | ORAL | Status: DC
  Administered 2019-10-25 – 2019-10-28 (×4): 40 mg via ORAL
  Filled 2019-10-25 (×5): qty 1

## 2019-10-25 MED ORDER — INSULIN GLARGINE 100 UNIT/ML ~~LOC~~ SOLN
14.0000 [IU] | Freq: Every day | SUBCUTANEOUS | Status: DC
  Administered 2019-10-25 – 2019-10-26 (×2): 14 [IU] via SUBCUTANEOUS
  Filled 2019-10-25 (×3): qty 0.14

## 2019-10-25 MED ORDER — NITROGLYCERIN 0.4 MG SL SUBL: 0.4000 mg | SUBLINGUAL_TABLET | SUBLINGUAL | Status: DC | PRN

## 2019-10-25 MED ORDER — FERROUS SULFATE 325 (65 FE) MG PO TABS
325.0000 mg | ORAL_TABLET | Freq: Every day | ORAL | Status: DC
  Administered 2019-10-26 – 2019-10-28 (×3): 325 mg via ORAL
  Filled 2019-10-25 (×3): qty 1

## 2019-10-25 MED ORDER — LACTULOSE 10 GM/15ML PO SOLN: 10.0000 g | Freq: Three times a day (TID) | ORAL | Status: DC | PRN

## 2019-10-25 MED ORDER — ENOXAPARIN SODIUM 40 MG/0.4ML ~~LOC~~ SOLN
40.0000 mg | SUBCUTANEOUS | Status: DC
  Administered 2019-10-25 – 2019-10-27 (×3): 40 mg via SUBCUTANEOUS
  Filled 2019-10-25 (×3): qty 0.4

## 2019-10-25 MED ORDER — HYDROMORPHONE HCL 1 MG/ML IJ SOLN: 0.5000 mg | Freq: Once | INTRAMUSCULAR | Status: DC

## 2019-10-25 NOTE — ED Triage Notes (Signed)
Pt c/o pain and swelling in r hand for past few days.  Reports history of cellulitis.

## 2019-10-25 NOTE — H&P (Signed)
History and Physical  DAHLTON HINDE WUJ:[id number] DOB: 07/09/1946 DOA: 10/25/2019  Referring physician: Vanessa Orlinda  PCP: Redmond School, MD   Chief Complaint: right hand pain and swelling  HPI: [name] is a 73 y.o. male with stage 3 CKD, type 2 DM, CAD, DJD, diastolic heart failure, GERD and other co morbidities listed below presents with recurrent right hand pain and swelling. Pt reports gradual worsening of right hand pain and swelling over the past several days and has become increasingly painful and associated with low grade fever.  He had cellulitis of his right hand 6 months ago in April 2020 and was treated with IV vancomycin and he was discharged with doxycycline and he had done well from that hospitalization.  He does have a history of gout but has never had gouty attacks in the upper extremities but has had them in the lower extremities.  He does not take any medications for gout at this time. He came to the hospital from home because the pain became unbearable.  It is a throbbing aching pain that does feel similar to how it felt when he was admitted in April.  There have been no injuries to hand and no known areas of broken skin.  He is a diabetic and his diabetes is not optimally controlled.  He is taking insulin injections.  He started having chills this morning.  He denies chest pain and SOB. He denies nausea and vomiting.    ED Course: He is noted to have an elevated WBC of 16.7.  Hg was 11.8.  Creatinine is 2.08.  BUN 32.  Lactic acid is 1.1. a1c 9.0. glucose is 218.  Covid test pending.  Uric acid is 10.4.  Xray of hand with findings of marked soft tissue swelling but no bony abnormalities seen.  Temp was 99.9.  Pulse ox 98% on RA.  BP 127/72.    Review of Systems: All systems reviewed and apart from history of presenting illness, are negative.  Past Medical History:  Diagnosis Date  . Anxiety   . Benign prostatic hypertrophy    Nocturia  . CAD (coronary artery disease)     a.  NSTEMI 10/2012 s/p DES to LAD & DES to 1st diagonal with residual diffuse nonobstructive dz in LCx/RCA   . Chronic kidney disease, stage 3, mod decreased GFR    Creatinine of 1.5 in 03/2009  . Degenerative joint disease   . Depression    hx of  . Diabetes mellitus, type II ([place]) 12/10/2012.  . Diastolic heart failure    Pedal edema, "a long time ago"  . Erectile dysfunction   . Flu 2013   hx of  . GERD (gastroesophageal reflux disease)   . Hemorrhoids   . High cholesterol   . Hypertension    Exercise induced  . NSVT (nonsustained ventricular tachycardia) (HCC)    Exercise induced  . Obesity   . 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 Tubular adenoma 2014   Past Surgical History:  Procedure Laterality Date  . BACK SURGERY  2007   x2  . CARDIAC CATHETERIZATION     with stent placement  . COLONOSCOPY WITH ESOPHAGOGASTRODUODENOSCOPY (EGD) N/A 11/23/2013   Dr. Oneida Alar- TCS= left sided colitis and mild proctitis, moderate internal hemorrhoids, small nodule in the rectum, bx=tubular adenoma, EGD=-mild non-erosive gastritis  . HERNIA REPAIR    . LEFT HEART CATHETERIZATION WITH CORONARY ANGIOGRAM N/A 11/16/2012   Procedure: LEFT  HEART CATHETERIZATION WITH CORONARY ANGIOGRAM;  Surgeon: Sherren Mocha, MD;  Location: [facility] CATH LAB;  Service: Cardiovascular;  Laterality: N/A;  . PERCUTANEOUS CORONARY STENT INTERVENTION (PCI-S)  11/16/2012   Procedure: PERCUTANEOUS CORONARY STENT INTERVENTION (PCI-S);  Surgeon: Sherren Mocha, MD;  Location: [facility] CATH LAB;  Service: Cardiovascular;;  . TOTAL KNEE ARTHROPLASTY Right 11/15/2013   Procedure: RIGHT TOTAL KNEE ARTHROPLASTY;  Surgeon: Mauri Pole, MD;  Location: [facility] ORS;  Service: Orthopedics;  Laterality: Right;  . TOTAL KNEE ARTHROPLASTY Left 03/07/2015   Procedure: LEFT TOTAL KNEE ARTHROPLASTY;  Surgeon: Paralee Cancel, MD;  Location: [facility] ORS;  Service: Orthopedics;  Laterality: Left;   Social History:  reports that he  quit smoking about 26 years ago. His smoking use included cigarettes. He started smoking about 54 years ago. He has a 30.00 pack-year smoking history. He has never used smokeless tobacco. He reports that he does not drink alcohol or use drugs.  Allergies  Allergen Reactions  . Niacin And Related Other (See Comments)    Reaction: flushing and burning side effects even with extended release    Family History  Problem Relation Age of Onset  . Hypertension Other   . Cancer Other   . Heart disease Other   . Diabetes Other   . Hypertension Mother   . Cancer Father   . CAD Brother   . Colon cancer Neg Hx     Prior to Admission medications   Medication Sig Start Date End Date Taking? Authorizing Provider  acarbose (PRECOSE) 50 MG tablet Take 50 mg by mouth 3 (three) times daily with meals.  10/20/17   [provider]  aspirin EC 81 MG tablet Take 1 tablet (81 mg total) by mouth daily. 09/14/19   Herminio Commons, MD  calcitRIOL (ROCALTROL) 0.25 MCG capsule Take 1 capsule by mouth daily. 12/13/16   [provider]  cholecalciferol (VITAMIN D) 1000 units tablet Take 1,000 Units daily by mouth.    [provider]  ferrous sulfate 325 (65 FE) MG tablet Take 1 tablet (325 mg total) by mouth 3 (three) times daily after meals. 03/09/15   Danae Orleans, PA-C  furosemide (LASIX) 40 MG tablet Take 1 tablet (40 mg total) by mouth 2 (two) times daily. 08/09/19   Herminio Commons, MD  gabapentin (NEURONTIN) 100 MG capsule Take 1 capsule (100 mg total) by mouth 3 (three) times daily for 30 days. 04/06/19 09/14/19  Manuella Ghazi, Pratik D, DO  HYDROcodone-acetaminophen (NORCO) 10-325 MG tablet Take 1-2 tablets by mouth 4 (four) times daily as needed for moderate pain or severe pain. 04/06/19   Manuella Ghazi, Pratik D, DO  insulin NPH Human (NOVOLIN N) 100 UNIT/ML injection Inject 0.14 mLs (14 Units total) into the skin every morning. Ands syringes 1/day 10/18/19   Renato Shin, MD  lactulose  (CHRONULAC) 10 GM/15ML solution Take 10 g by mouth 3 (three) times daily as needed for mild constipation.    [provider]  lidocaine (LIDODERM) 5 % Place 1 patch onto the skin daily. Remove & Discard patch within 12 hours or as directed by MD 04/07/19   Manuella Ghazi, Pratik D, DO  meclizine (ANTIVERT) 25 MG tablet Take 25 mg by mouth every 6 (six) hours as needed for dizziness.    [provider]  mupirocin ointment (BACTROBAN) 2 % Apply 1 application topically 2 (two) times daily.    [provider]  nitroGLYCERIN (NITROSTAT) 0.4 MG SL tablet PLACE ONE TABLET UNDER THE TONGUE EVERY 5  MINUTES FOR 3 DOSES AS NEEDED Patient taking differently: Place 0.4 mg under the tongue every 5 (five) minutes as needed.  07/22/17   Herminio Commons, MD  ondansetron (ZOFRAN ODT) 4 MG disintegrating tablet 66m ODT q4 hours prn nausea/vomit Patient taking differently: Take 4 mg by mouth every 6 (six) hours as needed for nausea.  12/12/18   ZMilton Ferguson MD  polyethylene glycol (MIRALAX / GLYCOLAX) 17 g packet Take 17 g by mouth daily. 04/07/19   SManuella Ghazi Pratik D, DO  pravastatin (PRAVACHOL) 20 MG tablet Take 1 tablet (20 mg total) by mouth every evening. 09/14/19 12/13/19  KHerminio Commons MD   Physical Exam: Vitals:   10/25/19 0847 10/25/19 0900 10/25/19 0930 10/25/19 1020  BP: 138/63 128/72 (!) 143/63   Pulse: 99 92 89   Resp: 20     Temp: 98.9 F (37.2 C)   99.9 F (37.7 C)  TempSrc: Oral   Oral  SpO2: 98% 97% 96%   Weight:      Height:         General exam: Moderately built and nourished patient, lying comfortably supine on the gurney in no obvious distress.  Head, eyes and ENT: Nontraumatic and normocephalic. Pupils equally reacting to light and accommodation. Oral mucosa pale and dry.  Neck: Supple. No JVD, carotid bruit or thyromegaly.  Lymphatics: No lymphadenopathy.  Respiratory system: Clear to auscultation. No increased work of breathing.  Cardiovascular  system: normal S1 and S2 heard. No JVD, murmurs, gallops, clicks or pedal edema.  Gastrointestinal system: Abdomen is nondistended, soft and nontender. Normal bowel sounds heard. No organomegaly or masses appreciated.  Central nervous system: Alert and oriented. No focal neurological deficits.  Extremities: Symmetric 5 x 5 power. Peripheral pulses symmetrically felt.   Skin: No rashes or acute findings.  Musculoskeletal system: right hand with gross edema, very painful to touch, good pulses, no broken skin or scab seen, erythematous.   Psychiatry: Pleasant and cooperative.  Labs on Admission:  Basic Metabolic Panel: Recent Labs  Lab 10/25/19 1042  NA 140  K 4.6  CL 103  CO2 22  GLUCOSE 218*  BUN 32*  CREATININE 2.08*  CALCIUM 9.1   Liver Function Tests: No results for input(s): AST, ALT, ALKPHOS, BILITOT, PROT, ALBUMIN in the last 168 hours. No results for input(s): LIPASE, AMYLASE in the last 168 hours. No results for input(s): AMMONIA in the last 168 hours. CBC: Recent Labs  Lab 10/25/19 1042  WBC 16.7*  NEUTROABS 13.7*  HGB 11.8*  HCT 34.9*  MCV 96.4  PLT 205   Cardiac Enzymes: No results for input(s): CKTOTAL, CKMB, CKMBINDEX, TROPONINI in the last 168 hours.  BNP (last 3 results) No results for input(s): PROBNP in the last 8760 hours. CBG: Recent Labs  Lab 10/25/19 1016  GLUCAP 206*    Radiological Exams on Admission: Dg Hand Complete Right  Result Date: 10/25/2019 CLINICAL DATA:  Severe right hand pain and swelling extending into the arm for the past 4 days. No known injury. History of cellulitis. EXAM: RIGHT HAND - COMPLETE 3+ VIEW COMPARISON:  04/02/2019 FINDINGS: Marked diffuse soft tissue swelling. No soft tissue gas, bone destruction or periosteal reaction. No fractures seen. 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 IMPRESSION: Marked diffuse soft tissue swelling without  underlying bony abnormality. Electronically Signed   By: SClaudie ReveringM.D.   On: 10/25/2019 11:15    Assessment/Plan Principal Problem:  Cellulitis of right hand Active Problems:   Essential hypertension   Chronic kidney disease, stage 3, cardiorenal syndrome   Obstructive sleep apnea   CAD S/P percutaneous coronary angioplasty   Anemia, normocytic normochromic   GERD (gastroesophageal reflux disease)   CHF (congestive heart failure) (HCC)   Leukocytosis   Acute gouty arthritis   1. Swollen right hand - DDX includes recurrent cellulitis versus acute gouty arthritis.  The patient is being treated for both because at this time we are unable to differentiate the disease process. I have added CRP and sed rate tests.  Continue IV antibiotics.  Pt responded very well to IV vancomycin during last hospitalization.  Continue IV steroids and pain management.  Colchicine 1.2 mg x 1 dose.  Allopurinol 100 mg daily ordered to try and reduce uric acid levels.   2. Leukocytosis - possibly from cellulitis but could also be from acute inflammatory arthritis.   3. GERD - protonix ordered for GI protection.  4. Chronic diastolic CHF - stable, compensated, temporarily hold diuretics in setting of possible acute gout attack.   5. Stage 3 CKD - gently hydrate in setting of mild dehydration.  Follow renal function.  6. Type 2 DM with hyperglycemia - substitute NPH for lantus (glargine) while inpatient, add prandial novolog coverage and SSI coverage with CBG testing 5 times per day.  7. Essential hypertension - BP well controlled, follow.  8. OSA - will offer nightly CPAP while in hospital.    DVT Prophylaxis: lovenox Code Status: full   Family Communication: patient updated bedside, verbalized understanding   Disposition Plan: inpatient for IV antibiotics, IV fluids   Time spent: 55 minutes   Tatanisha Cuthbert [name], MD [facility] How to contact the [facility] Attending or Consulting provider [place] or  covering provider during after hours [place], for this patient?  1. Check the care team in [facility] and look for a) attending/consulting [facility] provider listed and b) the [facility] team listed 2. Log into [email or web address] and use [place]'s universal password to access. If you do not have the password, please contact the hospital operator. 3. Locate the [facility] provider you are looking for under [facility] and page to a number that you can be directly reached. 4. If you still have difficulty reaching the provider, please page the [facility] (Director on Call) for the Hospitalists listed on amion for assistance.

## 2019-10-25 NOTE — ED Notes (Signed)
Pt c/o nausea. Triplett, [place] notified. Order given for Zofran ODT 30m. Medication given.

## 2019-10-25 NOTE — ED Notes (Signed)
Unable to obtain IV at this time. Lab attempting blood draw but having much difficulty. Triplett, [place] notified. Pain medication order changed from IV to IM.

## 2019-10-25 NOTE — ED Provider Notes (Signed)
[facility] EMERGENCY DEPARTMENT Provider Note   CSN: [id number] Arrival date & time: 10/25/19  4166     History   Chief Complaint Chief Complaint  Patient presents with  . Hand Pain    HPI [name] is a 73 y.o. male.     HPI  [name] is a 73 y.o. male with PMH of DM, CKD, HTN who presents to the Emergency Department complaining of gradually increasing pain and swelling to the right hand and fingers.  Symptoms have been present for 4 days.  He describes the pain as aching and throbbing into his hand and radiating up toward his shoulder.  He reports a history of cellulitis of his right hand.  He states he was hospitalized for this approximately 6 months ago and his current symptoms feel similar to previous episode.  He denies known injury or open wounds of his hand.  He endorses fever and chills that began this morning.  Has hx of gout as well.  He is not taking any medications for symptom relief.  States he contacted his PCP and was advised to come to the emergency room for further evaluation.  He denies chest pain, shortness of breath, numbness or weakness of the extremity.    Past Medical History:  Diagnosis Date  . Anxiety   . Benign prostatic hypertrophy    Nocturia  . CAD (coronary artery disease)    a.  NSTEMI 10/2012 s/p DES to LAD & DES to 1st diagonal with residual diffuse nonobstructive dz in LCx/RCA   . Chronic kidney disease, stage 3, mod decreased GFR    Creatinine of 1.5 in 03/2009  . Degenerative joint disease   . Depression    hx of  . Diabetes mellitus, type II ([place]) 12/10/2012.  . Diastolic heart failure    Pedal edema, "a long time ago"  . Erectile dysfunction   . Flu 2013   hx of  . GERD (gastroesophageal reflux disease)   . Hemorrhoids   . High cholesterol   . Hypertension    Exercise induced  . NSVT (nonsustained ventricular tachycardia) (HCC)    Exercise induced  . Obesity   . Tobacco abuse, 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, quit  1994  . Tubular adenoma 2014    Patient Active Problem List   Diagnosis Date Noted  . Sepsis ([place]) 04/02/2019  . Leukocytosis 04/02/2019  . Cellulitis of right hand 04/01/2019  . Peripheral edema   . CHF (congestive heart failure) ([place]) 07/05/2015  . Acute congestive heart failure ([place]) 07/04/2015  . Acute on chronic diastolic congestive heart failure ([place]) 07/04/2015  . HLD (hyperlipidemia) 07/04/2015  . Anxiety state 07/04/2015  . GERD (gastroesophageal reflux disease) 07/04/2015  . Heart block 07/04/2015  . Edema 03/11/2015  . Diastolic CHF, chronic ([place]) 03/11/2015  . 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 Muscle weakness (generalized) 01/13/2014  . Knee stiffness 01/13/2014  . Gout 12/11/2013  . Cellulitis 11/19/2013  . Expected blood loss anemia 11/17/2013  . Obese 11/17/2013  . S/P right TKA 11/15/2013  . Anemia, normocytic normochromic 01/12/2013  . Diabetes mellitus type 2 in obese ([place]) 12/10/2012  . CAD S/P percutaneous coronary angioplasty 11/24/2012  . Overweight(278.02) 11/17/2012  . Obstructive sleep apnea 11/15/2012  . Chronic kidney disease, stage 3, cardiorenal syndrome 08/23/2011  . Essential hypertension     Past Surgical History:  Procedure Laterality Date  . BACK SURGERY  2007   x2  . CARDIAC  CATHETERIZATION     with stent placement  . COLONOSCOPY WITH ESOPHAGOGASTRODUODENOSCOPY (EGD) N/A 11/23/2013   Dr. Oneida Alar- TCS= left sided colitis and mild proctitis, moderate internal hemorrhoids, small nodule in the rectum, bx=tubular adenoma, EGD=-mild non-erosive gastritis  . HERNIA REPAIR    . LEFT HEART CATHETERIZATION WITH CORONARY ANGIOGRAM N/A 11/16/2012   Procedure: LEFT HEART CATHETERIZATION WITH CORONARY ANGIOGRAM;  Surgeon: Sherren Mocha, MD;  Location: [facility] CATH LAB;  Service: Cardiovascular;  Laterality: N/A;  . PERCUTANEOUS CORONARY STENT INTERVENTION (PCI-S)  11/16/2012   Procedure: PERCUTANEOUS CORONARY STENT INTERVENTION (PCI-S);  Surgeon: Sherren Mocha, MD;  Location: [facility] CATH LAB;  Service: Cardiovascular;;  . TOTAL KNEE ARTHROPLASTY Right 11/15/2013   Procedure: RIGHT TOTAL KNEE ARTHROPLASTY;  Surgeon: Mauri Pole, MD;  Location: [facility] ORS;  Service: Orthopedics;  Laterality: Right;  . TOTAL KNEE ARTHROPLASTY Left 03/07/2015   Procedure: LEFT TOTAL KNEE ARTHROPLASTY;  Surgeon: Paralee Cancel, MD;  Location: [facility] ORS;  Service: Orthopedics;  Laterality: Left;        Home Medications    Prior to Admission medications   Medication Sig Start Date End Date Taking? Authorizing Provider  acarbose (PRECOSE) 50 MG tablet Take 50 mg by mouth 3 (three) times daily with meals.  10/20/17   [provider]  aspirin EC 81 MG tablet Take 1 tablet (81 mg total) by mouth daily. 09/14/19   Herminio Commons, MD  calcitRIOL (ROCALTROL) 0.25 MCG capsule Take 1 capsule by mouth daily. 12/13/16   [provider]  cholecalciferol (VITAMIN D) 1000 units tablet Take 1,000 Units daily by mouth.    [provider]  ferrous sulfate 325 (65 FE) MG tablet Take 1 tablet (325 mg total) by mouth 3 (three) times daily after meals. 03/09/15   Danae Orleans, PA-C  furosemide (LASIX) 40 MG tablet Take 1 tablet (40 mg total) by mouth 2 (two) times daily. 08/09/19   Herminio Commons, MD  gabapentin (NEURONTIN) 100 MG capsule Take 1 capsule (100 mg total) by mouth 3 (three) times daily for 30 days. 04/06/19 09/14/19  Manuella Ghazi, Pratik D, DO  HYDROcodone-acetaminophen (NORCO) 10-325 MG tablet Take 1-2 tablets by mouth 4 (four) times daily as needed for moderate pain or severe pain. 04/06/19   Manuella Ghazi, Pratik D, DO  insulin NPH Human (NOVOLIN N) 100 UNIT/ML injection Inject 0.14 mLs (14 Units total) into the skin every morning. Ands syringes 1/day 10/18/19   Renato Shin, MD  lactulose (CHRONULAC) 10 GM/15ML solution Take 10 g by mouth 3 (three) times daily as needed for mild constipation.    [provider]  lidocaine (LIDODERM) 5 % Place 1 patch  onto the skin daily. Remove & Discard patch within 12 hours or as directed by MD 04/07/19   Manuella Ghazi, Pratik D, DO  meclizine (ANTIVERT) 25 MG tablet Take 25 mg by mouth every 6 (six) hours as needed for dizziness.    [provider]  mupirocin ointment (BACTROBAN) 2 % Apply 1 application topically 2 (two) times daily.    [provider]  nitroGLYCERIN (NITROSTAT) 0.4 MG SL tablet PLACE ONE TABLET UNDER THE TONGUE EVERY 5 MINUTES FOR 3 DOSES AS NEEDED Patient taking differently: Place 0.4 mg under the tongue every 5 (five) minutes as needed.  07/22/17   Herminio Commons, MD  ondansetron (ZOFRAN ODT) 4 MG disintegrating tablet 45m ODT q4 hours prn nausea/vomit Patient taking differently: Take 4 mg by mouth every 6 (six) hours as needed for nausea.  12/12/18  Milton Ferguson, MD  polyethylene glycol (MIRALAX / GLYCOLAX) 17 g packet Take 17 g by mouth daily. 04/07/19   Manuella Ghazi, Pratik D, DO  pravastatin (PRAVACHOL) 20 MG tablet Take 1 tablet (20 mg total) by mouth every evening. 09/14/19 12/13/19  Herminio Commons, MD    Family History Family History  Problem Relation Age of Onset  . Hypertension Other   . Cancer Other   . Heart disease Other   . Diabetes Other   . Hypertension Mother   . Cancer Father   . CAD Brother   . Colon cancer Neg Hx     Social History Social History   Tobacco Use  . Smoking status: Former Smoker    Packs/day: 1.00    Years: 30.00    Pack years: 30.00    Types: Cigarettes    Start date: 01/22/1965    Quit date: 12/23/1992    Years since quitting: 26.8  . Smokeless tobacco: Never Used  Substance Use Topics  . Alcohol use: No    Alcohol/week: 0.0 standard drinks  . Drug use: No     Allergies   Niacin and related   Review of Systems Review of Systems  Constitutional: Positive for chills and fever.  Respiratory: Negative for cough and shortness of breath.   Cardiovascular: Negative for chest pain.  Gastrointestinal: Negative for  abdominal pain, nausea and vomiting.  Musculoskeletal: Positive for arthralgias and joint swelling. Negative for neck pain.       Pain and swelling of the right hand and fingers  Skin: Negative for color change and wound.  Neurological: Negative for dizziness, weakness and numbness.     Physical Exam Updated Vital Signs BP 138/63 (BP Location: Left Arm)   Pulse 99   Temp 98.9 F (37.2 C) (Oral)   Resp 20   Ht 6' 2"  (1.88 m)   Wt 134 kg   SpO2 98%   BMI 37.93 kg/m   Physical Exam Vitals signs and nursing note reviewed.  Constitutional:      General: He is not in acute distress.    Appearance: Normal appearance. He is not ill-appearing or toxic-appearing.     Comments: Patient is uncomfortable appearing.  Mild rigors noted  Neck:     Musculoskeletal: Normal range of motion and neck supple.  Cardiovascular:     Rate and Rhythm: Normal rate and regular rhythm.     Pulses: Normal pulses.  Pulmonary:     Effort: Pulmonary effort is normal.     Breath sounds: Normal breath sounds.  Abdominal:     Palpations: Abdomen is soft.     Tenderness: There is no abdominal tenderness.  Musculoskeletal:        General: Swelling and tenderness present. No deformity or signs of injury.     Comments: Diffuse edema noted of the dorsal right hand extending to the distal fingers and proximal wrist.  Moderate warmth of the extremity noted with mild erythema present.  No lymphangitis.  No rash or open wounds.  Lymphadenopathy:     Cervical: No cervical adenopathy.  Skin:    General: Skin is warm.     Capillary Refill: Capillary refill takes less than 2 seconds.     Findings: No bruising, erythema or rash.  Neurological:     General: No focal deficit present.     Mental Status: He is alert.     Sensory: No sensory deficit.      ED Treatments / Results  Labs (  all labs ordered are listed, but only abnormal results are displayed) Labs Reviewed  BASIC METABOLIC PANEL - Abnormal; Notable  for the following components:      Result Value   Glucose, Bld 218 (*)    BUN 32 (*)    Creatinine, Ser 2.08 (*)    GFR calc non Af Amer 30 (*)    GFR calc Af Amer 35 (*)    All other components within normal limits  CBC WITH DIFFERENTIAL/PLATELET - Abnormal; Notable for the following components:   WBC 16.7 (*)    RBC 3.62 (*)    Hemoglobin 11.8 (*)    HCT 34.9 (*)    Neutro Abs 13.7 (*)    Monocytes Absolute 2.0 (*)    Abs Immature Granulocytes 0.08 (*)    All other components within normal limits  URIC ACID - Abnormal; Notable for the following components:   Uric Acid, Serum 10.4 (*)    All other components within normal limits  CBG MONITORING, ED - Abnormal; Notable for the following components:   Glucose-Capillary 206 (*)    All other components within normal limits    EKG None  Radiology Dg Hand Complete Right  Result Date: 10/25/2019 CLINICAL DATA:  Severe right hand pain and swelling extending into the arm for the past 4 days. No known injury. History of cellulitis. EXAM: RIGHT HAND - COMPLETE 3+ VIEW COMPARISON:  04/02/2019 FINDINGS: Marked diffuse soft tissue swelling. No soft tissue gas, bone destruction or periosteal reaction. No fractures seen. 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 IMPRESSION: Marked diffuse soft tissue swelling without underlying bony abnormality. Electronically Signed   By: Claudie Revering M.D.   On: 10/25/2019 11:15    Procedures Procedures (including critical care time)  Medications Ordered in ED Medications  HYDROmorphone (DILAUDID) injection 0.5 mg (has no administration in time range)     Initial Impression / Assessment and Plan / ED Course  I have reviewed the triage vital signs and the nursing notes.  Pertinent labs & imaging results that were available during my care of the patient were reviewed by me and considered in my medical decision making (see chart for  details).    On review of medical records, pt was admitted here on April of this year for cellulitis and reports a similar sx course. He does have low grade fever here and rigors with a leukocytosis. He does not appear septic.   Pt also seen by Dr. Lacinda Axon and care plan discussed. Pt would likely benefit from admit and IV abx.    1337  Consulted hospitalist and he agrees to admit.  IV vancomycin initiated.    Final Clinical Impressions(s) / ED Diagnoses   Final diagnoses:  Cellulitis of right hand    ED Discharge Orders    None       Kem Parkinson, PA-C 10/25/19 1412    [name], MD 10/29/19 1001

## 2019-10-25 NOTE — Progress Notes (Signed)
Pharmacy Antibiotic Note  [name] is a 73 y.o. male admitted on 10/25/2019 with cellulitis.  Pharmacy has been consulted for Vancomycin dosing.  Plan: Vancomycin 2000 mg IV x 1 dose. Vancomycin 1500 mg IV every 24 hours.  Goal trough 10-15 mcg/mL.  Monitor labs, c/s, and patient improvement.  Height: 6' 2"  (188 cm) Weight: 295 lb 6.7 oz (134 kg) IBW/kg (Calculated) : 82.2  Temp (24hrs), Avg:99.4 F (37.4 C), Min:98.9 F (37.2 C), Max:99.9 F (37.7 C)  Recent Labs  Lab 10/25/19 1042  WBC 16.7*  CREATININE 2.08*    Estimated Creatinine Clearance: 45.3 mL/min (A) (by C-G formula based on SCr of 2.08 mg/dL (H)).    Allergies  Allergen Reactions  . Niacin And Related Other (See Comments)    Reaction: flushing and burning side effects even with extended release    Antimicrobials this admission: Vanco 11/2 >>     Dose adjustments this admission: [place]  Microbiology results: None pending  Thank you for allowing pharmacy to be a part of this patient's care.  Ramond Craver 10/25/2019 2:08 PM

## 2019-10-26 LAB — COMPREHENSIVE METABOLIC PANEL
ALT: 12 U/L (ref 0–44)
AST: 13 U/L — ABNORMAL LOW (ref 15–41)
Albumin: 2.8 g/dL — ABNORMAL LOW (ref 3.5–5.0)
Alkaline Phosphatase: 56 U/L (ref 38–126)
Anion gap: 9 (ref 5–15)
BUN: 48 mg/dL — ABNORMAL HIGH (ref 8–23)
CO2: 25 mmol/L (ref 22–32)
Calcium: 8.9 mg/dL (ref 8.9–10.3)
Chloride: 106 mmol/L (ref 98–111)
Creatinine, Ser: 2.29 mg/dL — ABNORMAL HIGH (ref 0.61–1.24)
GFR calc Af Amer: 31 mL/min — ABNORMAL LOW (ref >60)
GFR calc non Af Amer: 27 mL/min — ABNORMAL LOW (ref >60)
Glucose, Bld: 233 mg/dL — ABNORMAL HIGH (ref 70–99)
Potassium: 4.4 mmol/L (ref 3.5–5.1)
Sodium: 140 mmol/L (ref 135–145)
Total Bilirubin: 0.4 mg/dL (ref 0.3–1.2)
Total Protein: 7 g/dL (ref 6.5–8.1)

## 2019-10-26 LAB — CBC WITH DIFFERENTIAL/PLATELET
Abs Immature Granulocytes: 0.07 10*3/uL (ref 0.00–0.07)
Basophils Absolute: 0 10*3/uL (ref 0.0–0.1)
Basophils Relative: 0 %
Eosinophils Absolute: 0 10*3/uL (ref 0.0–0.5)
Eosinophils Relative: 0 %
HCT: 31.2 % — ABNORMAL LOW (ref 39.0–52.0)
Hemoglobin: 9.8 g/dL — ABNORMAL LOW (ref 13.0–17.0)
Immature Granulocytes: 1 %
Lymphocytes Relative: 3 %
Lymphs Abs: 0.4 10*3/uL — ABNORMAL LOW (ref 0.7–4.0)
MCH: 32.1 pg (ref 26.0–34.0)
MCHC: 31.4 g/dL (ref 30.0–36.0)
MCV: 102.3 fL — ABNORMAL HIGH (ref 80.0–100.0)
Monocytes Absolute: 0.3 10*3/uL (ref 0.1–1.0)
Monocytes Relative: 3 %
Neutro Abs: 11.4 10*3/uL — ABNORMAL HIGH (ref 1.7–7.7)
Neutrophils Relative %: 93 %
Platelets: 178 10*3/uL (ref 150–400)
RBC: 3.05 MIL/uL — ABNORMAL LOW (ref 4.22–5.81)
RDW: 13.5 % (ref 11.5–15.5)
WBC: 12.2 10*3/uL — ABNORMAL HIGH (ref 4.0–10.5)
nRBC: 0 % (ref 0.0–0.2)

## 2019-10-26 LAB — SARS CORONAVIRUS 2 (TAT 6-24 HRS): SARS Coronavirus 2: NEGATIVE

## 2019-10-26 LAB — MAGNESIUM: Magnesium: 1.9 mg/dL (ref 1.7–2.4)

## 2019-10-26 LAB — GLUCOSE, CAPILLARY
Glucose-Capillary: 213 mg/dL — ABNORMAL HIGH (ref 70–99)
Glucose-Capillary: 231 mg/dL — ABNORMAL HIGH (ref 70–99)
Glucose-Capillary: 251 mg/dL — ABNORMAL HIGH (ref 70–99)
Glucose-Capillary: 307 mg/dL — ABNORMAL HIGH (ref 70–99)
Glucose-Capillary: 250 mg/dL — ABNORMAL HIGH (ref 70–99)

## 2019-10-26 LAB — URIC ACID: Uric Acid, Serum: 10.4 mg/dL — ABNORMAL HIGH (ref 3.7–8.6)

## 2019-10-26 MED ORDER — VANCOMYCIN HCL 1.5 G IV SOLR
1500.0000 mg | INTRAVENOUS | Status: DC
  Filled 2019-10-26: qty 1500

## 2019-10-26 MED ORDER — INSULIN ASPART 100 UNIT/ML ~~LOC~~ SOLN
6.0000 [IU] | Freq: Three times a day (TID) | SUBCUTANEOUS | Status: DC
  Administered 2019-10-26 – 2019-10-27 (×2): 6 [IU] via SUBCUTANEOUS

## 2019-10-26 MED ORDER — INSULIN GLARGINE 100 UNIT/ML ~~LOC~~ SOLN
16.0000 [IU] | Freq: Every day | SUBCUTANEOUS | Status: DC
  Filled 2019-10-26 (×2): qty 0.16

## 2019-10-26 MED ORDER — VANCOMYCIN HCL 1.5 G IV SOLR
1500.0000 mg | INTRAVENOUS | Status: DC
  Administered 2019-10-26 – 2019-10-27 (×2): 1500 mg via INTRAVENOUS
  Filled 2019-10-26 (×4): qty 1500

## 2019-10-26 NOTE — Progress Notes (Signed)
PROGRESS NOTE  [name]  JYN:[id number]  DOB: 09-Jan-1946  DOA: 10/25/2019 PCP: Redmond School, MD   Brief Admission Hx: 73 y.o. male with stage 3 CKD, type 2 DM, CAD, DJD, diastolic heart failure, GERD and other co morbidities listed below presents with recurrent right hand pain and swelling.   MDM/Assessment & Plan:   1. Swollen right hand - DDx includes recurrent cellulitis versus acute gouty arthritis.  The patient is being treated for both because at this time we are unable to differentiate the disease process. Following CRP and sed rate tests.  Continue IV antibiotics.  Pt responded very well to IV vancomycin during last hospitalization.  Continue IV steroids and pain management.  Colchicine 1.2 mg x 1 given.  Allopurinol 100 mg daily ordered to try and reduce uric acid levels.  The hand is much less swollen and painful today.    2. Leukocytosis - WBC trending down.  Possibly from cellulitis but could also be from acute inflammatory arthritis.   3. GERD - protonix ordered for GI protection.  4. Chronic diastolic CHF - stable, compensated, temporarily hold diuretics in setting of possible acute gout attack.   5. Stage 3 CKD - gently hydrate in setting of mild dehydration.  Follow renal function.  6. Type 2 DM with hyperglycemia - substituted NPH for lantus (glargine) while inpatient, added prandial novolog coverage and SSI coverage with CBG testing 5 times per day.  7. Essential hypertension - BP well controlled, follow.  8. OSA - will offer nightly CPAP while in hospital.    DVT Prophylaxis: lovenox Code Status: full   Family Communication: patient updated bedside, verbalized understanding   Disposition Plan: inpatient for IV antibiotics, IV fluids   Consultants:    Procedures:    Antimicrobials:  Vancomycin 11/2>>   Subjective: Pt reporting less pain and swelling in right hand today, he insists he will NOT go to SNF at discharge but would accept home health  PT.   Objective: Vitals:   10/25/19 1705 10/25/19 2024 10/25/19 2108 10/26/19 0557  BP: 126/69  (!) 121/49 (!) 117/54  Pulse: 68  (!) 55 (!) 56  Resp: 20  20 20   Temp: 98.5 F (36.9 C)  98.7 F (37.1 C) 98.5 F (36.9 C)  TempSrc: Oral  Oral Oral  SpO2: 92% 95% 98% 95%  Weight:      Height:        Intake/Output Summary (Last 24 hours) at 10/26/2019 1251 Last data filed at 10/26/2019 0900 Gross per 24 hour  Intake 1194.65 ml  Output 400 ml  Net 794.65 ml   Filed Weights   10/25/19 0846  Weight: 134 kg   REVIEW OF SYSTEMS  As per history otherwise all reviewed and reported negative  Exam:  General exam: awake, alert, NAD.  Respiratory system: Clear. No increased work of breathing. Cardiovascular system: S1 & S2 heard. No JVD, murmurs, gallops, clicks or pedal edema. Gastrointestinal system: Abdomen is nondistended, soft and nontender. Normal bowel sounds heard. Central nervous system: Alert and oriented. No focal neurological deficits. Extremities: right hand with multiple arthritic deformities, less swelling redness and heat as yesterday.  Data Reviewed: Basic Metabolic Panel: Recent Labs  Lab 10/25/19 1042 10/26/19 0537  NA 140 140  K 4.6 4.4  CL 103 106  CO2 22 25  GLUCOSE 218* 233*  BUN 32* 48*  CREATININE 2.08* 2.29*  CALCIUM 9.1 8.9  MG  --  1.9   Liver Function Tests: Recent Labs  Lab 10/26/19 0537  AST 13*  ALT 12  ALKPHOS 56  BILITOT 0.4  PROT 7.0  ALBUMIN 2.8*   No results for input(s): LIPASE, AMYLASE in the last 168 hours. No results for input(s): AMMONIA in the last 168 hours. CBC: Recent Labs  Lab 10/25/19 1042 10/26/19 0537  WBC 16.7* 12.2*  NEUTROABS 13.7* 11.4*  HGB 11.8* 9.8*  HCT 34.9* 31.2*  MCV 96.4 102.3*  PLT 205 178   Cardiac Enzymes: No results for input(s): CKTOTAL, CKMB, CKMBINDEX, TROPONINI in the last 168 hours. CBG (last 3)  Recent Labs    10/26/19 0323 10/26/19 0726 10/26/19 1144  GLUCAP 213* 231*  251*   Recent Results (from the past 240 hour(s))  SARS CORONAVIRUS 2 (TAT 6-24 HRS) Nasopharyngeal Nasopharyngeal Swab     Status: None   Collection Time: 10/25/19  2:03 PM   Specimen: Nasopharyngeal Swab  Result Value Ref Range Status   SARS Coronavirus 2 NEGATIVE NEGATIVE Final    Comment: (NOTE) SARS-CoV-2 target nucleic acids are NOT DETECTED. The SARS-CoV-2 RNA is generally detectable in upper and lower respiratory specimens during the acute phase of infection. Negative results do not preclude SARS-CoV-2 infection, do not rule out co-infections with other pathogens, and should not be used as the sole basis for treatment or other patient management decisions. Negative results must be combined with clinical observations, patient history, and epidemiological information. The expected result is Negative. Fact Sheet for Patients: SugarRoll.be Fact Sheet for Healthcare Providers: https://www.woods-mathews.com/ This test is not yet approved or cleared by the [place] FDA and  has been authorized for detection and/or diagnosis of SARS-CoV-2 by FDA under an Emergency Use Authorization (EUA). This EUA will remain  in effect (meaning this test can be used) for the duration of the COVID-19 declaration under Section 56 4(b)(1) of the Act, 21 U.S.C. section 360bbb-3(b)(1), unless the authorization is terminated or revoked sooner. Performed at [facility], [place] [address], [place], [place] [address]      Studies: Dg Hand Complete Right  Result Date: 10/25/2019 CLINICAL DATA:  Severe right hand pain and swelling extending into the arm for the past 4 days. No known injury. History of cellulitis. EXAM: RIGHT HAND - COMPLETE 3+ VIEW COMPARISON:  04/02/2019 FINDINGS: Marked diffuse soft tissue swelling. No soft tissue gas, bone destruction or periosteal reaction. No fractures seen. Multiple carpal cysts are again demonstrated as well as mild  degenerative changes involving the 1st IP joint. IMPRESSION: Marked diffuse soft tissue swelling without underlying bony abnormality. Electronically Signed   By: Claudie Revering M.D.   On: 10/25/2019 11:15   Scheduled Meds: . allopurinol  100 mg Oral Daily  . aspirin EC  81 mg Oral Daily  . calcitRIOL  0.25 mcg Oral Daily  . cholecalciferol  1,000 Units Oral Daily  . enoxaparin (LOVENOX) injection  40 mg Subcutaneous Q24H  . ferrous sulfate  325 mg Oral Q breakfast  . gabapentin  100 mg Oral TID  . insulin aspart  0-5 Units Subcutaneous QHS  . insulin aspart  0-9 Units Subcutaneous TID WC  . insulin aspart  4 Units Subcutaneous TID WC  . insulin glargine  14 Units Subcutaneous Daily  . methylPREDNISolone (SOLU-MEDROL) injection  40 mg Intravenous Q12H  . pantoprazole  40 mg Oral Q0600  . polyethylene glycol  17 g Oral Daily  . pravastatin  20 mg Oral QPM   Continuous Infusions: . sodium chloride 50 mL/hr at 10/26/19 1115  .  vancomycin (VANCOCIN) 1500 mg/536m IVPB (Vial-Mate [employer]) 1,500 mg (10/26/19 1249)    Principal Problem:   Cellulitis of right hand Active Problems:   Essential hypertension   Chronic kidney disease, stage 3, cardiorenal syndrome   Obstructive sleep apnea   CAD S/P percutaneous coronary angioplasty   Anemia, normocytic normochromic   GERD (gastroesophageal reflux disease)   CHF (congestive heart failure) (HCC)   Leukocytosis   Acute gouty arthritis  Time spent:   CIrwin Brakeman MD [facility] 10/26/2019, 12:51 PM    LOS: 1 day  How to contact the TLafayette General Medical CenterAttending or Consulting provider [place] covering provider during after hours [place] for this patient?  1. Check the care team in [facility] look for a) attending/consulting TRH provider listed and b) the TSac[facility] Hsptlteam listed 2. Log into [email or web address] and use [place]'s universal password to access. If you do not have the password, please contact the hospital operator. 3. Locate the TPam Specialty Hospital Of Corp[facility] Christi Southprovider  you are looking for under [facility] and page to a number that you can be directly reached. 4. If you still have difficulty reaching the provider, please page the DCrest[facility](Director on Call) for the Hospitalists listed on amion for assistance.

## 2019-10-26 NOTE — Plan of Care (Signed)
  Problem: Acute Rehab PT Goals(only PT should resolve) Goal: Pt Will Go Supine/Side To Sit Flowsheets (Taken 10/26/2019 1414) Pt will go Supine/Side to Sit: Independently Goal: Patient Will Transfer Sit To/From Stand Flowsheets (Taken 10/26/2019 1414) Patient will transfer sit to/from stand: with min guard assist Goal: Pt Will Transfer Bed To Chair/Chair To Bed Flowsheets (Taken 10/26/2019 1414) Pt will Transfer Bed to Chair/Chair to Bed: with min assist Goal: Pt Will Ambulate Flowsheets (Taken 10/26/2019 1414) Pt will Ambulate:  100 feet  with cane  with supervision  2:22 PM, 10/26/19 Josue Hector PT DPT  Physical Therapist with [facility]  [phone]

## 2019-10-26 NOTE — Plan of Care (Signed)

## 2019-10-26 NOTE — TOC Progression Note (Signed)
Home Health Choices:  [facility] [phone]  [place] my Favorites Quality of Patient Care Rating 4  out of 5 stars Patient Survey Summary Rating 5 out of 5 stars [facility] [phone]  [place] my Favorites Quality of Patient Care Rating 3 out of 5 stars Patient Survey Summary Rating 5 out of [place] [phone]  [place] my Favorites Quality of Patient Care Rating 2  out of 5 stars Patient Survey Summary Rating 4 out of [place] [phone]  [place] my Favorites Quality of Patient Care Rating 4  out of 5 stars Patient Survey Summary Rating 4 out of 5 stars [place] [phone]) [phone]  Add [facility], INCto my Favorites Quality of Patient Care Rating 4  out of 5 stars Patient Survey Summary Rating 5 out of 5 stars [place] [phone]  [place], INCto my Favorites Quality of Patient Care Rating 4 out of 5 stars Patient Survey Summary Rating 4 out of 5 stars [place] [phone]  [place] my Favorites Quality of Patient Care Rating 4 out of 5 stars Patient Survey Summary Rating 4 out of 5 stars [place] AGE [phone]  [place] my Favorites Quality of Patient Care Rating 2  out of 5 stars Patient Survey Summary Rating 5 out of 5 stars [facility] [place] [phone]  Add [facility] [place] my Favorites Quality of Patient Care Rating 3  out of 5 stars Patient Survey Summary Rating 4 out of 5 stars [place] [phone]  [place] my Favorites Quality of Patient Care Rating 3 out of 5 stars Patient Survey Summary Rating 4 out of 5 stars [facility] [phone]  Add [facility] my Favorites Quality of Patient Care Rating 4  out of 5 stars Patient Survey Summary Rating 3 out of [place] [phone]  [place] my Favorites Quality of Patient Care Rating 4  out of 5 stars Patient Survey Summary Rating

## 2019-10-26 NOTE — Evaluation (Signed)
Physical Therapy Evaluation Patient Details Name: [name] MRN: [id number] DOB: 1946/06/24 Today's Date: 10/26/2019   History of Present Illness  [name] is a 73 y.o. male with stage 3 CKD, type 2 DM, CAD, DJD, diastolic heart failure, GERD and other co morbidities listed below presents with recurrent right hand pain and swelling. Pt reports gradual worsening of right hand pain and swelling over the past several days and has become increasingly painful and associated with low grade fever.  He had cellulitis of his right hand 6 months ago in April 2020 and was treated with IV vancomycin and he was discharged with doxycycline and he had done well from that hospitalization.  He does have a history of gout but has never had gouty attacks in the upper extremities but has had them in the lower extremities.  He does not take any medications for gout at this time. He came to the hospital from home because the pain became unbearable.  It is a throbbing aching pain that does feel similar to how it felt when he was admitted in April.  There have been no injuries to hand and no known areas of broken skin.  He is a diabetic and his diabetes is not optimally controlled.  He is taking insulin injections.  He started having chills this morning.  He denies chest pain and SOB. He denies nausea and vomiting.    Clinical Impression  Patient presents with noted BLE weakness and pain in RUE, which is negatively impacting his ability to perform his usual ADLs and functional mobility. Patient was able to perform bed mobility with Mod I, taking increased time to perform. Patient requires Mod A to stand from bed or from chair, likely impacted by the inability to use his RUE to assist in transfer due to pain. Patient was able to ambulate from chair to commode and back using SPC in LT, but demos mild unsteadiness initially. Patient reports ongoing history of vertigo which he has been treated for but has not resolved.  Patient requires increased time upon sitting up or upon standing to allow symptoms of dizziness to subside. Patient left seated in chair with call bell in reach, nursing staff notified of mobility status. Patient will benefit from continued physical therapy in hospital and recommended venue below to increase strength, balance, endurance for safe ADLs and gait.      Follow Up Recommendations SNF;Supervision/Assistance - 24 hour;Supervision for mobility/OOB    Equipment Recommendations  (Patient states his current toilet riser is too small)    Recommendations for Other Services OT consult     Precautions / Restrictions Precautions Precautions: Fall Precaution Comments: Hx of vertigo Restrictions Weight Bearing Restrictions: No Other Position/Activity Restrictions: Patient limited in use of RT hand with transfers due to pain      Mobility  Bed Mobility Overal bed mobility: Needs Assistance Bed Mobility: Sidelying to Sit;Supine to Sit;Sit to Supine   Sidelying to sit: Modified independent (Device/Increase time) Supine to sit: Modified independent (Device/Increase time) Sit to supine: Modified independent (Device/Increase time)   General bed mobility comments: Slow labored movement  Transfers Overall transfer level: Needs assistance Equipment used: 1 person hand held assist;Straight cane Transfers: Sit to/from Stand Sit to Stand: Mod assist;From elevated surface         General transfer comment: Slow, labored movement. Difficulty standing from low surfaces, unable to use RT hand to assist with transfers. Patient reports having lift chair at home  Ambulation/Gait Ambulation/Gait assistance: Min  guard Gait Distance (Feet): 12 Feet(ambulated from chair to commode (12 ft), toiletted, returned form commode to chair (12 ft)) Assistive device: Straight cane;1 person hand held assist Gait Pattern/deviations: Decreased step length - left;Decreased step length - right;Decreased stride  length Gait velocity: Decreased   General Gait Details: Slow cadence, decreased stride length, no LOB using SPC. Patient does require some time upon standing initially to allow vertigo symptomes to subside before ambulating  Stairs            Wheelchair Mobility    Modified Rankin (Stroke Patients Only)       Balance Overall balance assessment: Needs assistance Sitting-balance support: Feet supported;No upper extremity supported Sitting balance-Leahy Scale: Good Sitting balance - Comments: Seated at EOB   Standing balance support: No upper extremity supported Standing balance-Leahy Scale: Fair Standing balance comment: Standing with SPC, requires extra time upon standing for vertigo sx to decrease                             Pertinent Vitals/Pain Pain Assessment: 0-10 Pain Score: 5  Pain Location: RT hand Pain Descriptors / Indicators: Aching;Heaviness;Sore Pain Intervention(s): Limited activity within patient's tolerance;Monitored during session    [place] expects to be discharged to:: Private residence Living Arrangements: Alone Available Help at Discharge: Family Type of Home: House Home Access: Stairs to enter Entrance Stairs-Rails: Right;Left;Can reach both Entrance Stairs-Number of Steps: 2-3 Home Layout: One Page: Environmental consultant - 2 wheels;Cane - single point;Toilet riser Additional Comments: Pt reports that nieces and nephews live close and can assist some.     Prior Function Level of Independence: Independent with assistive device(s)         Comments: Hydrographic surveyor with AD, drives     Hand Dominance   Dominant Hand: Right    Extremity/Trunk Assessment   Upper Extremity Assessment Upper Extremity Assessment: RUE deficits/detail RUE Deficits / Details: Limited use due to pain from cellulitis RUE: Unable to fully assess due to pain    Lower Extremity Assessment Lower Extremity Assessment:  Generalized weakness    Cervical / Trunk Assessment Cervical / Trunk Assessment: Normal  Communication   Communication: No difficulties  Cognition Arousal/Alertness: Awake/alert Behavior During Therapy: WFL for tasks assessed/performed Overall Cognitive Status: Within Functional Limits for tasks assessed                                        General Comments      Exercises     Assessment/Plan    PT Assessment Patient needs continued PT services  PT Problem List Decreased strength;Decreased activity tolerance;Decreased balance;Decreased mobility;Pain       PT Treatment Interventions DME instruction;Gait training;Functional mobility training;Therapeutic activities;Therapeutic exercise;Patient/family education    PT Goals (Current goals can be found in the Care Plan section)  Acute Rehab PT Goals Patient Stated Goal: Return home PT Goal Formulation: With patient Time For Goal Achievement: 11/09/19 Potential to Achieve Goals: Good    Frequency Min 3X/week   Barriers to discharge        Co-evaluation               AM-PAC PT "6 Clicks" Mobility  Outcome Measure Help needed turning from your back to your side while in a flat bed without using bedrails?: A Little Help needed moving from lying on your back  to sitting on the side of a flat bed without using bedrails?: A Little Help needed moving to and from a bed to a chair (including a wheelchair)?: A Lot Help needed standing up from a chair using your arms (e.g., wheelchair or bedside chair)?: A Lot Help needed to walk in hospital room?: A Little Help needed climbing 3-5 steps with a railing? : A Lot 6 Click Score: 15    End of Session Equipment Utilized During Treatment: Gait belt Activity Tolerance: Patient limited by pain;Patient tolerated treatment well Patient left: in chair;with call bell/phone within reach Nurse Communication: Mobility status PT Visit Diagnosis: Unsteadiness on feet  (R26.81);Muscle weakness (generalized) (M62.81);Other abnormalities of gait and mobility (R26.89);Difficulty in walking, not elsewhere classified (R26.2);Dizziness and giddiness (R42)    Time: 1025-1110 PT Time Calculation (min) (ACUTE ONLY): 45 min   Charges:   PT Evaluation $PT Eval Moderate Complexity: 1 Mod PT Treatments $Therapeutic Activity: 23-37 mins        2:12 PM, 10/26/19 Josue Hector PT DPT  Physical Therapist with [facility]  [phone]

## 2019-10-26 NOTE — TOC Initial Note (Signed)
Transition of Care [facility]) - Initial/Assessment Note    Patient Details  Name: [name] MRN: [id number] Date of Birth: [birth year]  Transition of [facility]) CM/SW Contact:    Boneta Lucks, RN Phone Number: 10/26/2019, 2:31 PM  Clinical Narrative:   Patient admitted for cellulitis of right hand. PT recommending PT and OT. Patient lives alone, states he has family support.  He wants to go home with Home Health,he has used [facility] before.  Call Tim with [facility], MD updated he will place orders at discharge.               Expected Discharge Plan: Home/Self Care Barriers to Discharge: Continued Medical Work up   Patient Goals and CMS Choice Patient states their goals for this hospitalization and ongoing recovery are:: to go home. CMS Medicare.gov Compare Post Acute Care list provided to:: Patient Choice offered to / list presented to : Patient  Expected Discharge Plan and Services Expected Discharge Plan: Home/Self Care       Living arrangements for the past 2 months: Single Family Home                           HH Arranged: PT, OT [place] Agency: [facility] (formerly [employer]) Date [place]: 10/26/19 Time [place]: [place] Representative spoke with at [place]: Tim [employer]  Prior Living Arrangements/Services Living arrangements for the past 2 months: [place] with:: Self   Do you feel safe going back to the place where you live?: Yes      Need for Family Participation in Patient Care: Yes (Comment) Care giver support system in place?: Yes (comment)   Criminal Activity/Legal Involvement Pertinent to Current Situation/Hospitalization: No - Comment as needed  Activities of Daily Living Home Assistive Devices/Equipment: None ADL Screening (condition at time of admission) Patient's cognitive ability adequate to safely complete daily activities?: Yes Is the patient deaf or have difficulty hearing?: Yes Does the  patient have difficulty seeing, even when wearing glasses/contacts?: No Does the patient have difficulty concentrating, remembering, or making decisions?: No Patient able to express need for assistance with ADLs?: No Does the patient have difficulty dressing or bathing?: Yes Independently performs ADLs?: Yes (appropriate for developmental age) Does the patient have difficulty walking or climbing stairs?: Yes Weakness of Legs: Both Weakness of Arms/Hands: None  Emotional Assessment      Orientation: : Oriented to Self, Oriented to Place, Oriented to  Time, Oriented to Situation Alcohol / Substance Use: Not Applicable Psych Involvement: No (comment)  Admission diagnosis:  Cellulitis of right hand [L03.113] Patient Active Problem List   Diagnosis Date Noted  . Acute gouty arthritis 10/25/2019  . Sepsis ([place]) 04/02/2019  . Leukocytosis 04/02/2019  . Cellulitis of right hand 04/01/2019  . Peripheral edema   . CHF (congestive heart failure) ([place]) 07/05/2015  . Acute congestive heart failure ([place]) 07/04/2015  . Acute on chronic diastolic congestive heart failure ([place]) 07/04/2015  . HLD (hyperlipidemia) 07/04/2015  . Anxiety state 07/04/2015  . GERD (gastroesophageal reflux disease) 07/04/2015  . Heart block 07/04/2015  . Edema 03/11/2015  . Diastolic CHF, chronic ([place]) 03/11/2015  . 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 Muscle weakness (generalized) 01/13/2014  . Knee stiffness 01/13/2014  . Gout 12/11/2013  . Cellulitis 11/19/2013  . Expected blood loss anemia 11/17/2013  . Obese 11/17/2013  . S/P right TKA 11/15/2013  . Anemia, normocytic normochromic 01/12/2013  .  Diabetes mellitus type 2 in obese ([place]) 12/10/2012  . CAD S/P percutaneous coronary angioplasty 11/24/2012  . Overweight(278.02) 11/17/2012  . Obstructive sleep apnea 11/15/2012  . Chronic kidney disease, stage 3, cardiorenal syndrome 08/23/2011  . Essential hypertension    PCP:  Redmond School, MD Pharmacy:    [facility], [place] [address] Phone: [phone] Fax: [phone]  [facility] [address], [place] - [place] [address] [place] [address] Phone: [phone] Fax: [phone]    Readmission Risk Interventions No flowsheet data found.

## 2019-10-27 LAB — CBC WITH DIFFERENTIAL/PLATELET
Abs Immature Granulocytes: 0.04 10*3/uL (ref 0.00–0.07)
Basophils Absolute: 0 10*3/uL (ref 0.0–0.1)
Basophils Relative: 0 %
Eosinophils Absolute: 0 10*3/uL (ref 0.0–0.5)
Eosinophils Relative: 0 %
HCT: 28.8 % — ABNORMAL LOW (ref 39.0–52.0)
Hemoglobin: 9.1 g/dL — ABNORMAL LOW (ref 13.0–17.0)
Immature Granulocytes: 0 %
Lymphocytes Relative: 5 %
Lymphs Abs: 0.5 10*3/uL — ABNORMAL LOW (ref 0.7–4.0)
MCH: 31.9 pg (ref 26.0–34.0)
MCHC: 31.6 g/dL (ref 30.0–36.0)
MCV: 101.1 fL — ABNORMAL HIGH (ref 80.0–100.0)
Monocytes Absolute: 0.4 10*3/uL (ref 0.1–1.0)
Monocytes Relative: 3 %
Neutro Abs: 10.6 10*3/uL — ABNORMAL HIGH (ref 1.7–7.7)
Neutrophils Relative %: 92 %
Platelets: 194 10*3/uL (ref 150–400)
RBC: 2.85 MIL/uL — ABNORMAL LOW (ref 4.22–5.81)
RDW: 13.3 % (ref 11.5–15.5)
WBC: 11.6 10*3/uL — ABNORMAL HIGH (ref 4.0–10.5)
nRBC: 0 % (ref 0.0–0.2)

## 2019-10-27 LAB — MAGNESIUM: Magnesium: 2.1 mg/dL (ref 1.7–2.4)

## 2019-10-27 LAB — COMPREHENSIVE METABOLIC PANEL
ALT: 14 U/L (ref 0–44)
AST: 12 U/L — ABNORMAL LOW (ref 15–41)
Albumin: 2.5 g/dL — ABNORMAL LOW (ref 3.5–5.0)
Alkaline Phosphatase: 51 U/L (ref 38–126)
Anion gap: 12 (ref 5–15)
BUN: 63 mg/dL — ABNORMAL HIGH (ref 8–23)
CO2: 22 mmol/L (ref 22–32)
Calcium: 8.9 mg/dL (ref 8.9–10.3)
Chloride: 105 mmol/L (ref 98–111)
Creatinine, Ser: 2.24 mg/dL — ABNORMAL HIGH (ref 0.61–1.24)
GFR calc Af Amer: 32 mL/min — ABNORMAL LOW (ref >60)
GFR calc non Af Amer: 28 mL/min — ABNORMAL LOW (ref >60)
Glucose, Bld: 256 mg/dL — ABNORMAL HIGH (ref 70–99)
Potassium: 4.5 mmol/L (ref 3.5–5.1)
Sodium: 139 mmol/L (ref 135–145)
Total Bilirubin: 0.3 mg/dL (ref 0.3–1.2)
Total Protein: 6.4 g/dL — ABNORMAL LOW (ref 6.5–8.1)

## 2019-10-27 LAB — GLUCOSE, CAPILLARY
Glucose-Capillary: 235 mg/dL — ABNORMAL HIGH (ref 70–99)
Glucose-Capillary: 237 mg/dL — ABNORMAL HIGH (ref 70–99)
Glucose-Capillary: 223 mg/dL — ABNORMAL HIGH (ref 70–99)
Glucose-Capillary: 214 mg/dL — ABNORMAL HIGH (ref 70–99)
Glucose-Capillary: 157 mg/dL — ABNORMAL HIGH (ref 70–99)

## 2019-10-27 LAB — URIC ACID: Uric Acid, Serum: 10.1 mg/dL — ABNORMAL HIGH (ref 3.7–8.6)

## 2019-10-27 MED ORDER — INSULIN ASPART 100 UNIT/ML ~~LOC~~ SOLN
8.0000 [IU] | Freq: Three times a day (TID) | SUBCUTANEOUS | Status: DC
  Administered 2019-10-27 – 2019-10-28 (×4): 8 [IU] via SUBCUTANEOUS

## 2019-10-27 MED ORDER — INSULIN GLARGINE 100 UNIT/ML ~~LOC~~ SOLN
18.0000 [IU] | Freq: Every day | SUBCUTANEOUS | Status: DC
  Administered 2019-10-27 – 2019-10-28 (×2): 18 [IU] via SUBCUTANEOUS
  Filled 2019-10-27 (×3): qty 0.18

## 2019-10-27 NOTE — Plan of Care (Signed)

## 2019-10-27 NOTE — Progress Notes (Signed)
PROGRESS NOTE    [name]  OZY:[id number] DOB: 1946-08-14 DOA: 10/25/2019 PCP: Redmond School, MD   Brief Narrative:  Per HPI: 73 y.o.malewith stage 3 CKD, type 2 DM, CAD, DJD, diastolic heart failure, GERD and other co morbidities listed below presents with recurrent right hand pain and swelling.   11/4: Patient states that swelling and pain in his right arm are slowly improving.  He still has difficulty moving his right arm and there is still a significant amount of swelling present.  Leukocytosis is downtrending.  Assessment & Plan:   Principal Problem:   Cellulitis of right hand Active Problems:   Essential hypertension   Chronic kidney disease, stage 3, cardiorenal syndrome   Obstructive sleep apnea   CAD S/P percutaneous coronary angioplasty   Anemia, normocytic normochromic   GERD (gastroesophageal reflux disease)   CHF (congestive heart failure) (HCC)   Leukocytosis   Acute gouty arthritis   Right hand swelling likely secondary to cellulitis -Does not appear to have acute gouty arthritis clinically, despite uric acid elevation -Continue IV vancomycin -Continue on allopurinol -Continue pain management -Trend leukocytosis with repeat CBC in a.m.  Chronic diastolic CHF-stable -Holding diuretics for now  GERD -PPI  CKD stage III -Currently stable on gentle IV fluid  Type 2 diabetes with hyperglycemia -Increased long-acting and mealtime insulin today, continue to follow -Some likely related to steroid use which has now been discontinued  Essential hypertension-controlled -Continue to follow  OSA -CPAP   DVT prophylaxis: Lovenox Code Status: Full Family Communication: None at bedside Disposition Plan: Continue IV antibiotics for cellulitis treatment.  OT evaluation.  Anticipate discharge in the next 1 to 2 days.   Consultants:   None  Procedures:   None  Antimicrobials:   Vancomycin 11/2->   Subjective: Patient seen and evaluated  today with no new acute complaints or concerns. No acute concerns or events noted overnight.  He states his right arm swelling has been improving.  Objective: Vitals:   10/26/19 1433 10/26/19 1945 10/26/19 2113 10/27/19 0545  BP:   (!) 121/56 (!) 120/54  Pulse: 64  (!) 54 (!) 51  Resp:   20 18  Temp:   98.2 F (36.8 C) 98.2 F (36.8 C)  TempSrc:   Oral Oral  SpO2: 100% 94% 97% 99%  Weight:    134 kg  Height:        Intake/Output Summary (Last 24 hours) at 10/27/2019 0932 Last data filed at 10/27/2019 0500 Gross per 24 hour  Intake 1632.94 ml  Output 475 ml  Net 1157.94 ml   Filed Weights   10/25/19 0846 10/27/19 0545  Weight: 134 kg 134 kg    Examination:  General exam: Appears calm and comfortable  Respiratory system: Clear to auscultation. Respiratory effort normal. Cardiovascular system: S1 & S2 heard, RRR. No JVD, murmurs, rubs, gallops or clicks. No pedal edema. Gastrointestinal system: Abdomen is nondistended, soft and nontender. No organomegaly or masses felt. Normal bowel sounds heard. Central nervous system: Alert and oriented. No focal neurological deficits. Extremities: Symmetric 5 x 5 power.  Right hand swelling with minimal tenderness to palpation.  Warmth and erythema noted. Skin: No rashes, lesions or ulcers Psychiatry: Judgement and insight appear normal. Mood & affect appropriate.     Data Reviewed: I have personally reviewed following labs and imaging studies  CBC: Recent Labs  Lab 10/25/19 1042 10/26/19 0537 10/27/19 0424  WBC 16.7* 12.2* 11.6*  NEUTROABS 13.7* 11.4* 10.6*  HGB 11.8* 9.8* 9.1*  HCT 34.9* 31.2* 28.8*  MCV 96.4 102.3* 101.1*  PLT 205 178 735   Basic Metabolic Panel: Recent Labs  Lab 10/25/19 1042 10/26/19 0537 10/27/19 0424  NA 140 140 139  K 4.6 4.4 4.5  CL 103 106 105  CO2 22 25 22   GLUCOSE 218* 233* 256*  BUN 32* 48* 63*  CREATININE 2.08* 2.29* 2.24*  CALCIUM 9.1 8.9 8.9  MG  --  1.9 2.1   GFR: Estimated  Creatinine Clearance: 42.1 mL/min (A) (by C-G formula based on SCr of 2.24 mg/dL (H)). Liver Function Tests: Recent Labs  Lab 10/26/19 0537 10/27/19 0424  AST 13* 12*  ALT 12 14  ALKPHOS 56 51  BILITOT 0.4 0.3  PROT 7.0 6.4*  ALBUMIN 2.8* 2.5*   No results for input(s): LIPASE, AMYLASE in the last 168 hours. No results for input(s): AMMONIA in the last 168 hours. Coagulation Profile: No results for input(s): INR, PROTIME in the last 168 hours. Cardiac Enzymes: No results for input(s): CKTOTAL, CKMB, CKMBINDEX, TROPONINI in the last 168 hours. BNP (last 3 results) No results for input(s): PROBNP in the last 8760 hours. HbA1C: No results for input(s): HGBA1C in the last 72 hours. CBG: Recent Labs  Lab 10/26/19 1144 10/26/19 1627 10/26/19 2117 10/27/19 0308 10/27/19 0805  GLUCAP 251* 307* 250* 235* 237*   Lipid Profile: No results for input(s): CHOL, HDL, LDLCALC, TRIG, CHOLHDL, LDLDIRECT in the last 72 hours. Thyroid Function Tests: Recent Labs    10/25/19 1405  TSH 0.512   Anemia Panel: No results for input(s): VITAMINB12, FOLATE, FERRITIN, TIBC, IRON, RETICCTPCT in the last 72 hours. Sepsis Labs: Recent Labs  Lab 10/25/19 1405 10/25/19 1635  LATICACIDVEN 1.1 1.4    Recent Results (from the past 240 hour(s))  SARS CORONAVIRUS 2 (TAT 6-24 HRS) Nasopharyngeal Nasopharyngeal Swab     Status: None   Collection Time: 10/25/19  2:03 PM   Specimen: Nasopharyngeal Swab  Result Value Ref Range Status   SARS Coronavirus 2 NEGATIVE NEGATIVE Final    Comment: (NOTE) SARS-CoV-2 target nucleic acids are NOT DETECTED. The SARS-CoV-2 RNA is generally detectable in upper and lower respiratory specimens during the acute phase of infection. Negative results do not preclude SARS-CoV-2 infection, do not rule out co-infections with other pathogens, and should not be used as the sole basis for treatment or other patient management decisions. Negative results must be combined  with clinical observations, patient history, and epidemiological information. The expected result is Negative. Fact Sheet for Patients: SugarRoll.be Fact Sheet for Healthcare Providers: https://www.woods-mathews.com/ This test is not yet approved or cleared by the [place] FDA and  has been authorized for detection and/or diagnosis of SARS-CoV-2 by FDA under an Emergency Use Authorization (EUA). This EUA will remain  in effect (meaning this test can be used) for the duration of the COVID-19 declaration under Section 56 4(b)(1) of the Act, 21 U.S.C. section 360bbb-3(b)(1), unless the authorization is terminated or revoked sooner. Performed at [facility], [place] [address] [place], [place] [address]          Radiology Studies: Dg Hand Complete Right  Result Date: 10/25/2019 CLINICAL DATA:  Severe right hand pain and swelling extending into the arm for the past 4 days. No known injury. History of cellulitis. EXAM: RIGHT HAND - COMPLETE 3+ VIEW COMPARISON:  04/02/2019 FINDINGS: Marked diffuse soft tissue swelling. No soft tissue gas, bone destruction or periosteal reaction. No fractures seen. 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 IMPRESSION: Marked diffuse soft tissue swelling without underlying bony abnormality. Electronically Signed   By: Claudie Revering M.D.   On: 10/25/2019 11:15        Scheduled Meds: . allopurinol  100 mg Oral Daily  . aspirin EC  81 mg Oral Daily  . calcitRIOL  0.25 mcg Oral Daily  . cholecalciferol  1,000 Units Oral Daily  . enoxaparin (LOVENOX) injection  40 mg Subcutaneous Q24H  . ferrous sulfate  325 mg Oral Q breakfast  . gabapentin  100 mg Oral TID  . insulin aspart  0-5 Units Subcutaneous QHS  . insulin aspart  0-9 Units Subcutaneous TID WC  . insulin aspart  8 Units Subcutaneous TID WC  . insulin glargine  18 Units  Subcutaneous Daily  . pantoprazole  40 mg Oral Q0600  . polyethylene glycol  17 g Oral Daily  . pravastatin  20 mg Oral QPM   Continuous Infusions: . sodium chloride 50 mL/hr at 10/26/19 1115  . vancomycin (VANCOCIN) 1500 mg/511m IVPB (Vial-Mate [employer]) 1,500 mg (10/26/19 1249)     LOS: 2 days    Time spent: 30 minutes    Jolanda Mccann DDarleen Crocker DO [facility] Pager [phone] If 7PM-7AM, please contact night-coverage [email or web address] Password [facility]11/03/2019, 9:32 AM

## 2019-10-27 NOTE — Progress Notes (Signed)
Physical Therapy Treatment Patient Details Name: [name] MRN: [id number] DOB: October 19, 1946 Today's Date: 10/27/2019    History of Present Illness [name] is a 73 y.o. male with stage 3 CKD, type 2 DM, CAD, DJD, diastolic heart failure, GERD and other co morbidities listed below presents with recurrent right hand pain and swelling. Pt reports gradual worsening of right hand pain and swelling over the past several days and has become increasingly painful and associated with low grade fever.  He had cellulitis of his right hand 6 months ago in April 2020 and was treated with IV vancomycin and he was discharged with doxycycline and he had done well from that hospitalization.  He does have a history of gout but has never had gouty attacks in the upper extremities but has had them in the lower extremities.  He does not take any medications for gout at this time. He came to the hospital from home because the pain became unbearable.  It is a throbbing aching pain that does feel similar to how it felt when he was admitted in April.  There have been no injuries to hand and no known areas of broken skin.  He is a diabetic and his diabetes is not optimally controlled.  He is taking insulin injections.  He started having chills this morning.  He denies chest pain and SOB. He denies nausea and vomiting.    PT Comments    Patient presents seated upright in chair. Patient tolerated treatment well today. Patient was educated on and performed seated BLE strengthening exercise to improve functional mobility. Patient was able to perform sit to stand transfer with improved return for hand placement and required Min A. Patient showed improved use of RT hand during transfers today, but still complains of pain impacting RT hand function. Patient continues to complain of increased dizziness/ vertigo upon standing, but resolves significantly within 1-2 minutes. Patient said he felt slightly less steady using SPC with  initial steps during ambulation due to vertigo like sx, so RW was used. Patient noted improved stability, showed good steadiness and was able to ambulate 200 feet in hallway using RW. Patient returned to seated upright in chair, call bell in reach. Patient will benefit from continued physical therapy in hospital and recommended venue below to increase strength, balance, endurance for safe ADLs and gait.     Follow Up Recommendations  SNF;Supervision/Assistance - 24 hour;Supervision for mobility/OOB     Equipment Recommendations       Recommendations for Other Services       Precautions / Restrictions Precautions Precautions: None Precaution Comments: Hx of vertigo Restrictions Weight Bearing Restrictions: No Other Position/Activity Restrictions: Patient limited in use of RT hand with transfers due to pain, improved today    Mobility  Bed Mobility                  Transfers Overall transfer level: Needs assistance Equipment used: 1 person hand held assist;Straight cane   Sit to Stand: Min guard;Min assist         General transfer comment: Slow, labored movement, improved use of RT hand today during transfers  Ambulation/Gait Ambulation/Gait assistance: Supervision;Modified independent (Device/Increase time) Gait Distance (Feet): 200 Feet Assistive device: Rolling walker (2 wheeled) Gait Pattern/deviations: Decreased step length - left;Decreased step length - right;Decreased stride length Gait velocity: Decreased   General Gait Details: Patient reported feeling somewhat more unsteady with initial use of SPC today, so RW was used. Patient showed good  stability using RW, no LOB, good tolerance to activity ambulating 200 feet in hallway   Stairs             Wheelchair Mobility    Modified Rankin (Stroke Patients Only)       Balance Overall balance assessment: Needs assistance Sitting-balance support: Feet supported;No upper extremity supported Sitting  balance-Leahy Scale: Good Sitting balance - Comments: Seated in chair   Standing balance support: Bilateral upper extremity supported;During functional activity Standing balance-Leahy Scale: Fair Standing balance comment: Standing with RW, requires extra time upon standing for vertigo sx to decrease                            Cognition Arousal/Alertness: Awake/alert Behavior During Therapy: WFL for tasks assessed/performed Overall Cognitive Status: Within Functional Limits for tasks assessed                                        Exercises General Exercises - Lower Extremity Long Arc Quad: Seated;AROM;Strengthening;Both;20 reps Hip Flexion/Marching: Seated;AROM;Strengthening;Both;20 reps Toe Raises: Seated;AROM;Strengthening;Both;20 reps Heel Raises: Seated;AROM;Strengthening;Both;20 reps    General Comments        Pertinent Vitals/Pain Pain Assessment: Faces Faces Pain Scale: Hurts a little bit Pain Location: RT hand Pain Descriptors / Indicators: Aching;Sore Pain Intervention(s): Monitored during session    Home Living                      Prior Function            PT Goals (current goals can now be found in the care plan section) Acute Rehab PT Goals Patient Stated Goal: Return home PT Goal Formulation: With patient Time For Goal Achievement: 11/09/19 Potential to Achieve Goals: Good Progress towards PT goals: Progressing toward goals    Frequency    Min 3X/week      PT Plan Current plan remains appropriate    Co-evaluation              AM-PAC PT "6 Clicks" Mobility   Outcome Measure  Help needed turning from your back to your side while in a flat bed without using bedrails?: A Little Help needed moving from lying on your back to sitting on the side of a flat bed without using bedrails?: A Little Help needed moving to and from a bed to a chair (including a wheelchair)?: A Little Help needed standing up from  a chair using your arms (e.g., wheelchair or bedside chair)?: A Little Help needed to walk in hospital room?: A Little Help needed climbing 3-5 steps with a railing? : A Lot 6 Click Score: 17    End of Session Equipment Utilized During Treatment: Gait belt Activity Tolerance: Patient limited by pain;Patient tolerated treatment well Patient left: in chair;with call bell/phone within reach Nurse Communication: Mobility status PT Visit Diagnosis: Unsteadiness on feet (R26.81);Muscle weakness (generalized) (M62.81);Other abnormalities of gait and mobility (R26.89);Difficulty in walking, not elsewhere classified (R26.2);Dizziness and giddiness (R42)     Time: 2671-2458 PT Time Calculation (min) (ACUTE ONLY): 30 min  Charges:  $Gait Training: 8-22 mins $Therapeutic Exercise: 8-22 mins                     11:13 AM, 10/27/19 Josue Hector PT DPT  Physical Therapist with [facility]  [phone]

## 2019-10-28 LAB — CBC WITH DIFFERENTIAL/PLATELET
Abs Immature Granulocytes: 0.03 10*3/uL (ref 0.00–0.07)
Basophils Absolute: 0 10*3/uL (ref 0.0–0.1)
Basophils Relative: 0 %
Eosinophils Absolute: 0 10*3/uL (ref 0.0–0.5)
Eosinophils Relative: 0 %
HCT: 30.5 % — ABNORMAL LOW (ref 39.0–52.0)
Hemoglobin: 9.5 g/dL — ABNORMAL LOW (ref 13.0–17.0)
Immature Granulocytes: 1 %
Lymphocytes Relative: 19 %
Lymphs Abs: 1.2 10*3/uL (ref 0.7–4.0)
MCH: 31.7 pg (ref 26.0–34.0)
MCHC: 31.1 g/dL (ref 30.0–36.0)
MCV: 101.7 fL — ABNORMAL HIGH (ref 80.0–100.0)
Monocytes Absolute: 0.5 10*3/uL (ref 0.1–1.0)
Monocytes Relative: 8 %
Neutro Abs: 4.6 10*3/uL (ref 1.7–7.7)
Neutrophils Relative %: 72 %
Platelets: 203 10*3/uL (ref 150–400)
RBC: 3 MIL/uL — ABNORMAL LOW (ref 4.22–5.81)
RDW: 13.4 % (ref 11.5–15.5)
WBC: 6.3 10*3/uL (ref 4.0–10.5)
nRBC: 0 % (ref 0.0–0.2)

## 2019-10-28 LAB — COMPREHENSIVE METABOLIC PANEL
ALT: 13 U/L (ref 0–44)
AST: 12 U/L — ABNORMAL LOW (ref 15–41)
Albumin: 2.4 g/dL — ABNORMAL LOW (ref 3.5–5.0)
Alkaline Phosphatase: 46 U/L (ref 38–126)
Anion gap: 7 (ref 5–15)
BUN: 64 mg/dL — ABNORMAL HIGH (ref 8–23)
CO2: 22 mmol/L (ref 22–32)
Calcium: 8.3 mg/dL — ABNORMAL LOW (ref 8.9–10.3)
Chloride: 110 mmol/L (ref 98–111)
Creatinine, Ser: 2.02 mg/dL — ABNORMAL HIGH (ref 0.61–1.24)
GFR calc Af Amer: 37 mL/min — ABNORMAL LOW (ref >60)
GFR calc non Af Amer: 32 mL/min — ABNORMAL LOW (ref >60)
Glucose, Bld: 171 mg/dL — ABNORMAL HIGH (ref 70–99)
Potassium: 4.1 mmol/L (ref 3.5–5.1)
Sodium: 139 mmol/L (ref 135–145)
Total Bilirubin: 0.5 mg/dL (ref 0.3–1.2)
Total Protein: 6 g/dL — ABNORMAL LOW (ref 6.5–8.1)

## 2019-10-28 LAB — GLUCOSE, CAPILLARY
Glucose-Capillary: 177 mg/dL — ABNORMAL HIGH (ref 70–99)
Glucose-Capillary: 131 mg/dL — ABNORMAL HIGH (ref 70–99)
Glucose-Capillary: 193 mg/dL — ABNORMAL HIGH (ref 70–99)

## 2019-10-28 LAB — URIC ACID: Uric Acid, Serum: 9.6 mg/dL — ABNORMAL HIGH (ref 3.7–8.6)

## 2019-10-28 LAB — MAGNESIUM: Magnesium: 2.1 mg/dL (ref 1.7–2.4)

## 2019-10-28 MED ORDER — GABAPENTIN 300 MG PO CAPS: 300.0000 mg | ORAL_CAPSULE | Freq: Three times a day (TID) | ORAL | Status: DC

## 2019-10-28 MED ORDER — ALLOPURINOL 100 MG PO TABS: 100.0000 mg | ORAL_TABLET | Freq: Every day | ORAL | 2 refills | Status: DC

## 2019-10-28 MED ORDER — DOXYCYCLINE HYCLATE 100 MG PO TABS: 100.0000 mg | ORAL_TABLET | Freq: Two times a day (BID) | ORAL | 0 refills | Status: AC

## 2019-10-28 MED ORDER — HYDROCODONE-ACETAMINOPHEN 10-325 MG PO TABS: 1.0000 | ORAL_TABLET | Freq: Four times a day (QID) | ORAL | 0 refills | Status: DC | PRN

## 2019-10-28 MED ORDER — DOXYCYCLINE HYCLATE 100 MG PO TABS
100.0000 mg | ORAL_TABLET | Freq: Two times a day (BID) | ORAL | Status: DC
  Administered 2019-10-28: 100 mg via ORAL
  Filled 2019-10-28: qty 1

## 2019-10-28 MED ORDER — HYDROCODONE-ACETAMINOPHEN 10-325 MG PO TABS
1.0000 | ORAL_TABLET | Freq: Four times a day (QID) | ORAL | Status: DC
  Administered 2019-10-28: 1 via ORAL
  Filled 2019-10-28: qty 1

## 2019-10-28 NOTE — Discharge Summary (Signed)
Physician Discharge Summary  [name] KCL:[id number] DOB: 09/22/1946 DOA: 10/25/2019  PCP: Redmond School, MD  Admit date: 10/25/2019  Discharge date: 10/28/2019  Admitted From:Home  Disposition:  Home  Recommendations for Outpatient Follow-up:  1. Follow up with PCP in 1-2 weeks 2. Finish cellulitis treatment with doxycycline for 6 more days for total 10-day course of treatment 3. Remain on allopurinol for chronic gout treatment and follow-up serum uric acid levels 4. Patient given Norco 5/325 mg number 10 tablets with 0 refills for pain management 5. Patient refuses SNF and will be discharged with home PT/OT  Home Health: Yes, home health PT/OT  Equipment/Devices: None  Discharge Condition: Stable  CODE STATUS: Full  Diet recommendation: Heart Healthy/carb modified  Brief/Interim Summary: Per HPI: 73 y.o.malewith stage 3 CKD, type 2 DM, CAD, DJD, diastolic heart failure, GERD and other co morbidities listed below presents with recurrent right hand pain and swelling.  11/4: Patient states that swelling and pain in his right arm are slowly improving.  He still has difficulty moving his right arm and there is still a significant amount of swelling present.  Leukocytosis is downtrending.  11/5: Patient continues to have right hand pain, but swelling and tenderness as well as warmth are much improved.  There is no further leukocytosis or fevers noted.  He is appropriate for transition to oral antibiotics with doxycycline today.  He will need 6 more days to complete a total 10-day course of treatment.  He is stable for discharge and will be given some refills on home narcotics as needed for pain control.  He refuses SNF on discharge and will be discharged with home PT/OT resources.  Discharge Diagnoses:  Principal Problem:   Cellulitis of right hand Active Problems:   Essential hypertension   Chronic kidney disease, stage 3, cardiorenal syndrome   Obstructive sleep  apnea   CAD S/P percutaneous coronary angioplasty   Anemia, normocytic normochromic   GERD (gastroesophageal reflux disease)   CHF (congestive heart failure) (HCC)   Leukocytosis   Acute gouty arthritis  Principal discharge diagnosis: Right upper extremity cellulitis superimposed on gouty arthritis without acute flare.  Discharge Instructions  Discharge Instructions    Diet - low sodium heart healthy   Complete by: As directed    Increase activity slowly   Complete by: As directed      Allergies as of 10/28/2019      Reactions   Niacin And Related Other (See Comments)   Reaction: flushing and burning side effects even with extended release      Medication List    TAKE these medications   acarbose 50 MG tablet Commonly known as: PRECOSE Take 50 mg by mouth 3 (three) times daily with meals.   allopurinol 100 MG tablet Commonly known as: ZYLOPRIM Take 1 tablet (100 mg total) by mouth daily. Start taking on: October 29, 2019   amLODipine 10 MG tablet Commonly known as: NORVASC Take 10 mg by mouth daily.   aspirin EC 81 MG tablet Take 1 tablet (81 mg total) by mouth daily.   calcitRIOL 0.25 MCG capsule Commonly known as: ROCALTROL Take 1 capsule by mouth daily.   cholecalciferol 1000 units tablet Commonly known as: VITAMIN D Take 1,000 Units daily by mouth.   docusate sodium 100 MG capsule Commonly known as: COLACE Take 100 mg by mouth daily as needed for mild constipation.   doxycycline 100 MG tablet Commonly known as: VIBRA-TABS Take 1 tablet (100 mg total) by mouth  every 12 (twelve) hours for 6 days.   ferrous sulfate 325 (65 FE) MG tablet Take 1 tablet (325 mg total) by mouth 3 (three) times daily after meals.   furosemide 40 MG tablet Commonly known as: LASIX Take 1 tablet (40 mg total) by mouth 2 (two) times daily.   gabapentin 300 MG capsule Commonly known as: NEURONTIN Take 1 capsule by mouth 3 (three) times daily.   HYDROcodone-acetaminophen  10-325 MG tablet Commonly known as: NORCO Take 1-2 tablets by mouth 4 (four) times daily as needed for moderate pain or severe pain.   insulin NPH Human 100 UNIT/ML injection Commonly known as: NovoLIN N Inject 0.14 mLs (14 Units total) into the skin every morning. Ands syringes 1/day   lactulose 10 GM/15ML solution Commonly known as: CHRONULAC Take 10 g by mouth 3 (three) times daily as needed for mild constipation.   lidocaine 5 % Commonly known as: LIDODERM Place 1 patch onto the skin daily. Remove & Discard patch within 12 hours or as directed by MD   nitroGLYCERIN 0.4 MG SL tablet Commonly known as: NITROSTAT PLACE ONE TABLET UNDER THE TONGUE EVERY 5 MINUTES FOR 3 DOSES AS NEEDED What changed: See the new instructions.   omeprazole 20 MG capsule Commonly known as: PRILOSEC Take 20 mg by mouth daily.   pravastatin 20 MG tablet Commonly known as: PRAVACHOL Take 1 tablet (20 mg total) by mouth every evening.      Follow-up Information    [facility] Follow up.   Specialty: [place] Why: PT /OT Contact information: [address] [place] [address] [phone]        Redmond School, MD Follow up in 1 week(s).   Specialty: Internal Medicine Contact information: [address] [place] [address] [phone]        Herminio Commons, MD .   Specialty: Cardiology Contact information: [place] [address] [phone]          Allergies  Allergen Reactions  . Niacin And Related Other (See Comments)    Reaction: flushing and burning side effects even with extended release    Consultations:  None   Procedures/Studies: Dg Hand Complete Right  Result Date: 10/25/2019 CLINICAL DATA:  Severe right hand pain and swelling extending into the arm for the past 4 days. No known injury. History of cellulitis. EXAM: RIGHT HAND - COMPLETE 3+ VIEW COMPARISON:  04/02/2019 FINDINGS: Marked diffuse soft  tissue swelling. No soft tissue gas, bone destruction or periosteal reaction. No fractures seen. 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 IMPRESSION: Marked diffuse soft tissue swelling without underlying bony abnormality. Electronically Signed   By: Claudie Revering M.D.   On: 10/25/2019 11:15     Discharge Exam: Vitals:   10/28/19 0557 10/28/19 0558  BP: (!) 117/47   Pulse: 92 (!) 43  Resp: 17   Temp: 98.5 F (36.9 C)   SpO2: 90% 100%   Vitals:   10/27/19 2049 10/27/19 2100 10/28/19 0557 10/28/19 0558  BP:  (!) 123/47 (!) 117/47   Pulse:  89 92 (!) 43  Resp:  20 17   Temp:  97.7 F (36.5 C) 98.5 F (36.9 C)   TempSrc:  Oral    SpO2: 94% 97% 90% 100%  Weight:      Height:        General: Pt is alert, awake, not in acute  distress Cardiovascular: RRR, S1/S2 +, no rubs, no gallops Respiratory: CTA bilaterally, no wheezing, no rhonchi Abdominal: Soft, NT, ND, bowel sounds + Extremities: no edema, no cyanosis    The results of significant diagnostics from this hospitalization (including imaging, microbiology, ancillary and laboratory) are listed below for reference.     Microbiology: Recent Results (from the past 240 hour(s))  SARS CORONAVIRUS 2 (TAT 6-24 HRS) Nasopharyngeal Nasopharyngeal Swab     Status: None   Collection Time: 10/25/19  2:03 PM   Specimen: Nasopharyngeal Swab  Result Value Ref Range Status   SARS Coronavirus 2 NEGATIVE NEGATIVE Final    Comment: (NOTE) SARS-CoV-2 target nucleic acids are NOT DETECTED. The SARS-CoV-2 RNA is generally detectable in upper and lower respiratory specimens during the acute phase of infection. Negative results do not preclude SARS-CoV-2 infection, do not rule out co-infections with other pathogens, and should not be used as the sole basis for treatment or other patient management decisions. Negative results must be combined with clinical  observations, patient history, and epidemiological information. The expected result is Negative. Fact Sheet for Patients: SugarRoll.be Fact Sheet for Healthcare Providers: https://www.woods-mathews.com/ This test is not yet approved or cleared by the [place] FDA and  has been authorized for detection and/or diagnosis of SARS-CoV-2 by FDA under an Emergency Use Authorization (EUA). This EUA will remain  in effect (meaning this test can be used) for the duration of the COVID-19 declaration under Section 56 4(b)(1) of the Act, 21 U.S.C. section 360bbb-3(b)(1), unless the authorization is terminated or revoked sooner. Performed at [facility], [place] [address] [place],  [address]      Labs: BNP (last 3 results) Recent Labs    02/19/19 1427  BNP 69.6   Basic Metabolic Panel: Recent Labs  Lab 10/25/19 1042 10/26/19 0537 10/27/19 0424 10/28/19 0622  NA 140 140 139 139  K 4.6 4.4 4.5 4.1  CL 103 106 105 110  CO2 22 25 22 22   GLUCOSE 218* 233* 256* 171*  BUN 32* 48* 63* 64*  CREATININE 2.08* 2.29* 2.24* 2.02*  CALCIUM 9.1 8.9 8.9 8.3*  MG  --  1.9 2.1 2.1   Liver Function Tests: Recent Labs  Lab 10/26/19 0537 10/27/19 0424 10/28/19 0622  AST 13* 12* 12*  ALT 12 14 13   ALKPHOS 56 51 46  BILITOT 0.4 0.3 0.5  PROT 7.0 6.4* 6.0*  ALBUMIN 2.8* 2.5* 2.4*   No results for input(s): LIPASE, AMYLASE in the last 168 hours. No results for input(s): AMMONIA in the last 168 hours. CBC: Recent Labs  Lab 10/25/19 1042 10/26/19 0537 10/27/19 0424 10/28/19 0622  WBC 16.7* 12.2* 11.6* 6.3  NEUTROABS 13.7* 11.4* 10.6* 4.6  HGB 11.8* 9.8* 9.1* 9.5*  HCT 34.9* 31.2* 28.8* 30.5*  MCV 96.4 102.3* 101.1* 101.7*  PLT 205 178 194 203   Cardiac Enzymes: No results for input(s): CKTOTAL, CKMB, CKMBINDEX, TROPONINI in the last 168 hours. BNP: Invalid input(s): POCBNP CBG: Recent Labs  Lab 10/27/19 1107 10/27/19 1603  10/27/19 2136 10/28/19 0303 10/28/19 0816  GLUCAP 223* 214* 157* 177* 131*   D-Dimer No results for input(s): DDIMER in the last 72 hours. Hgb A1c No results for input(s): HGBA1C in the last 72 hours. Lipid Profile No results for input(s): CHOL, HDL, LDLCALC, TRIG, CHOLHDL, LDLDIRECT in the last 72 hours. Thyroid function studies Recent Labs    10/25/19 1405  TSH 0.512   Anemia work up No results for input(s): VITAMINB12, FOLATE,  FERRITIN, TIBC, IRON, RETICCTPCT in the last 72 hours. Urinalysis    Component Value Date/Time   COLORURINE YELLOW 12/14/2016 0941   APPEARANCEUR CLEAR 12/14/2016 0941   LABSPEC <1.005 (L) 12/14/2016 0941   PHURINE 5.5 12/14/2016 0941   GLUCOSEU NEGATIVE 12/14/2016 0941   HGBUR NEGATIVE 12/14/2016 0941   BILIRUBINUR NEGATIVE 12/14/2016 0941   KETONESUR NEGATIVE 12/14/2016 0941   PROTEINUR NEGATIVE 12/14/2016 0941   UROBILINOGEN 1.0 03/02/2015 1100   NITRITE NEGATIVE 12/14/2016 0941   LEUKOCYTESUR NEGATIVE 12/14/2016 0941   Sepsis Labs Invalid input(s): PROCALCITONIN,  WBC,  LACTICIDVEN Microbiology Recent Results (from the past 240 hour(s))  SARS CORONAVIRUS 2 (TAT 6-24 HRS) Nasopharyngeal Nasopharyngeal Swab     Status: None   Collection Time: 10/25/19  2:03 PM   Specimen: Nasopharyngeal Swab  Result Value Ref Range Status   SARS Coronavirus 2 NEGATIVE NEGATIVE Final    Comment: (NOTE) SARS-CoV-2 target nucleic acids are NOT DETECTED. The SARS-CoV-2 RNA is generally detectable in upper and lower respiratory specimens during the acute phase of infection. Negative results do not preclude SARS-CoV-2 infection, do not rule out co-infections with other pathogens, and should not be used as the sole basis for treatment or other patient management decisions. Negative results must be combined with clinical observations, patient history, and epidemiological information. The expected result is Negative. Fact Sheet for  Patients: SugarRoll.be Fact Sheet for Healthcare Providers: https://www.woods-mathews.com/ This test is not yet approved or cleared by the [place] FDA and  has been authorized for detection and/or diagnosis of SARS-CoV-2 by FDA under an Emergency Use Authorization (EUA). This EUA will remain  in effect (meaning this test can be used) for the duration of the COVID-19 declaration under Section 56 4(b)(1) of the Act, 21 U.S.C. section 360bbb-3(b)(1), unless the authorization is terminated or revoked sooner. Performed at [facility], [place] [address] [place], [place] [address]      Time coordinating discharge: 35 minutes  SIGNED:   Rodena Goldmann, DO [facility] 10/28/2019, 10:36 AM  If 7PM-7AM, please contact night-coverage [email or web address]

## 2019-10-28 NOTE — TOC Transition Note (Signed)
Transition of Care [facility]) - CM/SW Discharge Note   Patient Details  Name: [name] MRN: [id number] Date of Birth: October 10, 1946  Transition of Care [facility]) CM/SW Contact:  Shade Flood, LCSW Phone Number: 10/28/2019, 10:56 AM   Clinical Narrative:     Pt stable for dc today per MD. Plan remains for dc home with [facility] PT[facility] from [facility]. Updated Tim at [place] [facility] [employer]. There are no other TOC needs.  Final next level of care: [place] Barriers to Discharge: Barriers Resolved   Patient Goals and CMS Choice Patient states their goals for this hospitalization and ongoing recovery are:: to go home. CMS Medicare.gov Compare Post Acute Care list provided to:: Patient Choice offered to / list presented to : Patient  Discharge Placement                       Discharge Plan and Services                          HH Arranged: PT, OT [place] [facility] Date [place]: 10/26/19 Time [place]: 1431 Representative spoke with at [place]: [place]  Social Determinants of Health (SDOH) Interventions     Readmission Risk Interventions Readmission Risk Prevention Plan 10/28/2019  Transportation Screening Complete  Home Care Screening Complete  Medication Review (RN CM) Complete  Some recent data might be hidden

## 2019-10-28 NOTE — Progress Notes (Signed)
OT Cancellation Note  Patient Details Name: [name] MRN: [id number] DOB: [birth year]   Cancelled Treatment:    Reason Eval/Treat Not Completed: Patient declined, no reason specified. Attempted to evaluate pt this am. Pt complaining of increased pain and throbbing in right hand this am. Pt lying against bedrail on right arm, hand down by his side. Assisted pt with repositioning and elevating RUE up on pillows. Pt refusing to participate in ADLs or evaluation, stating he needed to sleep because he didn't get any last night. Will attempt evaluation at a later time. If pt should discharge prior to evaluation recommend HHOT evaluation to evaluate pt functioning in home environment.   Guadelupe Sabin, OTR/L  [phone] 10/28/2019, 7:45 AM

## 2019-10-31 DIAGNOSIS — I5032 Chronic diastolic (congestive) heart failure: Secondary | ICD-10-CM | POA: Diagnosis not present

## 2019-10-31 DIAGNOSIS — K219 Gastro-esophageal reflux disease without esophagitis: Secondary | ICD-10-CM | POA: Diagnosis not present

## 2019-10-31 DIAGNOSIS — G4733 Obstructive sleep apnea (adult) (pediatric): Secondary | ICD-10-CM | POA: Diagnosis not present

## 2019-10-31 DIAGNOSIS — E1122 Type 2 diabetes mellitus with diabetic chronic kidney disease: Secondary | ICD-10-CM | POA: Diagnosis not present

## 2019-10-31 DIAGNOSIS — N183 Chronic kidney disease, stage 3 unspecified: Secondary | ICD-10-CM | POA: Diagnosis not present

## 2019-10-31 DIAGNOSIS — Z955 Presence of coronary angioplasty implant and graft: Secondary | ICD-10-CM | POA: Diagnosis not present

## 2019-10-31 DIAGNOSIS — I459 Conduction disorder, unspecified: Secondary | ICD-10-CM | POA: Diagnosis not present

## 2019-10-31 DIAGNOSIS — I252 Old myocardial infarction: Secondary | ICD-10-CM | POA: Diagnosis not present

## 2019-10-31 DIAGNOSIS — Z87891 Personal history of nicotine dependence: Secondary | ICD-10-CM | POA: Diagnosis not present

## 2019-10-31 DIAGNOSIS — I2510 Atherosclerotic heart disease of native coronary artery without angina pectoris: Secondary | ICD-10-CM | POA: Diagnosis not present

## 2019-10-31 DIAGNOSIS — Z96653 Presence of artificial knee joint, bilateral: Secondary | ICD-10-CM | POA: Diagnosis not present

## 2019-10-31 DIAGNOSIS — Z7982 Long term (current) use of aspirin: Secondary | ICD-10-CM | POA: Diagnosis not present

## 2019-10-31 DIAGNOSIS — L03113 Cellulitis of right upper limb: Secondary | ICD-10-CM | POA: Diagnosis not present

## 2019-10-31 DIAGNOSIS — Z9181 History of falling: Secondary | ICD-10-CM | POA: Diagnosis not present

## 2019-10-31 DIAGNOSIS — I130 Hypertensive heart and chronic kidney disease with heart failure and stage 1 through stage 4 chronic kidney disease, or unspecified chronic kidney disease: Secondary | ICD-10-CM | POA: Diagnosis not present

## 2019-10-31 DIAGNOSIS — Z794 Long term (current) use of insulin: Secondary | ICD-10-CM | POA: Diagnosis not present

## 2019-10-31 DIAGNOSIS — M109 Gout, unspecified: Secondary | ICD-10-CM | POA: Diagnosis not present

## 2019-11-02 DIAGNOSIS — I252 Old myocardial infarction: Secondary | ICD-10-CM | POA: Diagnosis not present

## 2019-11-02 DIAGNOSIS — I130 Hypertensive heart and chronic kidney disease with heart failure and stage 1 through stage 4 chronic kidney disease, or unspecified chronic kidney disease: Secondary | ICD-10-CM | POA: Diagnosis not present

## 2019-11-02 DIAGNOSIS — Z87891 Personal history of nicotine dependence: Secondary | ICD-10-CM | POA: Diagnosis not present

## 2019-11-02 DIAGNOSIS — Z794 Long term (current) use of insulin: Secondary | ICD-10-CM | POA: Diagnosis not present

## 2019-11-02 DIAGNOSIS — L03113 Cellulitis of right upper limb: Secondary | ICD-10-CM | POA: Diagnosis not present

## 2019-11-02 DIAGNOSIS — I2510 Atherosclerotic heart disease of native coronary artery without angina pectoris: Secondary | ICD-10-CM | POA: Diagnosis not present

## 2019-11-02 DIAGNOSIS — M109 Gout, unspecified: Secondary | ICD-10-CM | POA: Diagnosis not present

## 2019-11-02 DIAGNOSIS — Z96653 Presence of artificial knee joint, bilateral: Secondary | ICD-10-CM | POA: Diagnosis not present

## 2019-11-02 DIAGNOSIS — I459 Conduction disorder, unspecified: Secondary | ICD-10-CM | POA: Diagnosis not present

## 2019-11-02 DIAGNOSIS — Z9181 History of falling: Secondary | ICD-10-CM | POA: Diagnosis not present

## 2019-11-02 DIAGNOSIS — Z7982 Long term (current) use of aspirin: Secondary | ICD-10-CM | POA: Diagnosis not present

## 2019-11-02 DIAGNOSIS — N183 Chronic kidney disease, stage 3 unspecified: Secondary | ICD-10-CM | POA: Diagnosis not present

## 2019-11-02 DIAGNOSIS — E1122 Type 2 diabetes mellitus with diabetic chronic kidney disease: Secondary | ICD-10-CM | POA: Diagnosis not present

## 2019-11-02 DIAGNOSIS — G4733 Obstructive sleep apnea (adult) (pediatric): Secondary | ICD-10-CM | POA: Diagnosis not present

## 2019-11-02 DIAGNOSIS — K219 Gastro-esophageal reflux disease without esophagitis: Secondary | ICD-10-CM | POA: Diagnosis not present

## 2019-11-02 DIAGNOSIS — Z955 Presence of coronary angioplasty implant and graft: Secondary | ICD-10-CM | POA: Diagnosis not present

## 2019-11-02 DIAGNOSIS — I5032 Chronic diastolic (congestive) heart failure: Secondary | ICD-10-CM | POA: Diagnosis not present

## 2019-11-03 ENCOUNTER — Other Ambulatory Visit: Payer: Self-pay | Admitting: *Deleted

## 2019-11-03 DIAGNOSIS — L0390 Cellulitis, unspecified: Secondary | ICD-10-CM | POA: Diagnosis not present

## 2019-11-03 DIAGNOSIS — G894 Chronic pain syndrome: Secondary | ICD-10-CM | POA: Diagnosis not present

## 2019-11-03 NOTE — Patient Outreach (Signed)
[facility]) Care Management  11/03/2019  [name] October 27, 1946 [id number]    EMMI-GENERAL DISCARGED RED ON EMMI ALERT Day # 4 Date: 11/02/2019 Red Alert Reason: Know who to call  Outreach#1 RN contacted pt and inquired EMMI red concerning the above. Pt verified he is aware to contact with any concerns. RN inquired on any other needs at this time. Pt indicates no needs and all issues have been resolved. Pt appreciative for the follow up call.  PLAN: No needs case will be closed.  Raina Mina, RN Care Management Coordinator [place] Office [phone]

## 2019-11-10 DIAGNOSIS — Z87891 Personal history of nicotine dependence: Secondary | ICD-10-CM | POA: Diagnosis not present

## 2019-11-10 DIAGNOSIS — M109 Gout, unspecified: Secondary | ICD-10-CM | POA: Diagnosis not present

## 2019-11-10 DIAGNOSIS — I252 Old myocardial infarction: Secondary | ICD-10-CM | POA: Diagnosis not present

## 2019-11-10 DIAGNOSIS — N183 Chronic kidney disease, stage 3 unspecified: Secondary | ICD-10-CM | POA: Diagnosis not present

## 2019-11-10 DIAGNOSIS — I130 Hypertensive heart and chronic kidney disease with heart failure and stage 1 through stage 4 chronic kidney disease, or unspecified chronic kidney disease: Secondary | ICD-10-CM | POA: Diagnosis not present

## 2019-11-10 DIAGNOSIS — G4733 Obstructive sleep apnea (adult) (pediatric): Secondary | ICD-10-CM | POA: Diagnosis not present

## 2019-11-10 DIAGNOSIS — E1122 Type 2 diabetes mellitus with diabetic chronic kidney disease: Secondary | ICD-10-CM | POA: Diagnosis not present

## 2019-11-10 DIAGNOSIS — L03113 Cellulitis of right upper limb: Secondary | ICD-10-CM | POA: Diagnosis not present

## 2019-11-10 DIAGNOSIS — I5032 Chronic diastolic (congestive) heart failure: Secondary | ICD-10-CM | POA: Diagnosis not present

## 2019-11-10 DIAGNOSIS — Z9181 History of falling: Secondary | ICD-10-CM | POA: Diagnosis not present

## 2019-11-10 DIAGNOSIS — Z96653 Presence of artificial knee joint, bilateral: Secondary | ICD-10-CM | POA: Diagnosis not present

## 2019-11-10 DIAGNOSIS — K219 Gastro-esophageal reflux disease without esophagitis: Secondary | ICD-10-CM | POA: Diagnosis not present

## 2019-11-10 DIAGNOSIS — Z955 Presence of coronary angioplasty implant and graft: Secondary | ICD-10-CM | POA: Diagnosis not present

## 2019-11-10 DIAGNOSIS — Z7982 Long term (current) use of aspirin: Secondary | ICD-10-CM | POA: Diagnosis not present

## 2019-11-10 DIAGNOSIS — Z794 Long term (current) use of insulin: Secondary | ICD-10-CM | POA: Diagnosis not present

## 2019-11-10 DIAGNOSIS — I2510 Atherosclerotic heart disease of native coronary artery without angina pectoris: Secondary | ICD-10-CM | POA: Diagnosis not present

## 2019-11-10 DIAGNOSIS — I459 Conduction disorder, unspecified: Secondary | ICD-10-CM | POA: Diagnosis not present

## 2019-11-11 ENCOUNTER — Ambulatory Visit: Payer: Medicare Other | Admitting: Nutrition

## 2019-11-11 DIAGNOSIS — N183 Chronic kidney disease, stage 3 unspecified: Secondary | ICD-10-CM | POA: Diagnosis not present

## 2019-11-11 DIAGNOSIS — Z794 Long term (current) use of insulin: Secondary | ICD-10-CM | POA: Diagnosis not present

## 2019-11-11 DIAGNOSIS — Z9181 History of falling: Secondary | ICD-10-CM | POA: Diagnosis not present

## 2019-11-11 DIAGNOSIS — Z96653 Presence of artificial knee joint, bilateral: Secondary | ICD-10-CM | POA: Diagnosis not present

## 2019-11-11 DIAGNOSIS — I5032 Chronic diastolic (congestive) heart failure: Secondary | ICD-10-CM | POA: Diagnosis not present

## 2019-11-11 DIAGNOSIS — Z955 Presence of coronary angioplasty implant and graft: Secondary | ICD-10-CM | POA: Diagnosis not present

## 2019-11-11 DIAGNOSIS — G4733 Obstructive sleep apnea (adult) (pediatric): Secondary | ICD-10-CM | POA: Diagnosis not present

## 2019-11-11 DIAGNOSIS — I459 Conduction disorder, unspecified: Secondary | ICD-10-CM | POA: Diagnosis not present

## 2019-11-11 DIAGNOSIS — M109 Gout, unspecified: Secondary | ICD-10-CM | POA: Diagnosis not present

## 2019-11-11 DIAGNOSIS — Z87891 Personal history of nicotine dependence: Secondary | ICD-10-CM | POA: Diagnosis not present

## 2019-11-11 DIAGNOSIS — L03113 Cellulitis of right upper limb: Secondary | ICD-10-CM | POA: Diagnosis not present

## 2019-11-11 DIAGNOSIS — I252 Old myocardial infarction: Secondary | ICD-10-CM | POA: Diagnosis not present

## 2019-11-11 DIAGNOSIS — E1122 Type 2 diabetes mellitus with diabetic chronic kidney disease: Secondary | ICD-10-CM | POA: Diagnosis not present

## 2019-11-11 DIAGNOSIS — I130 Hypertensive heart and chronic kidney disease with heart failure and stage 1 through stage 4 chronic kidney disease, or unspecified chronic kidney disease: Secondary | ICD-10-CM | POA: Diagnosis not present

## 2019-11-11 DIAGNOSIS — K219 Gastro-esophageal reflux disease without esophagitis: Secondary | ICD-10-CM | POA: Diagnosis not present

## 2019-11-11 DIAGNOSIS — Z7982 Long term (current) use of aspirin: Secondary | ICD-10-CM | POA: Diagnosis not present

## 2019-11-11 DIAGNOSIS — I2510 Atherosclerotic heart disease of native coronary artery without angina pectoris: Secondary | ICD-10-CM | POA: Diagnosis not present

## 2019-11-13 DIAGNOSIS — I5032 Chronic diastolic (congestive) heart failure: Secondary | ICD-10-CM | POA: Diagnosis not present

## 2019-11-13 DIAGNOSIS — I459 Conduction disorder, unspecified: Secondary | ICD-10-CM | POA: Diagnosis not present

## 2019-11-13 DIAGNOSIS — Z955 Presence of coronary angioplasty implant and graft: Secondary | ICD-10-CM | POA: Diagnosis not present

## 2019-11-13 DIAGNOSIS — Z96653 Presence of artificial knee joint, bilateral: Secondary | ICD-10-CM | POA: Diagnosis not present

## 2019-11-13 DIAGNOSIS — Z7982 Long term (current) use of aspirin: Secondary | ICD-10-CM | POA: Diagnosis not present

## 2019-11-13 DIAGNOSIS — G4733 Obstructive sleep apnea (adult) (pediatric): Secondary | ICD-10-CM | POA: Diagnosis not present

## 2019-11-13 DIAGNOSIS — I130 Hypertensive heart and chronic kidney disease with heart failure and stage 1 through stage 4 chronic kidney disease, or unspecified chronic kidney disease: Secondary | ICD-10-CM | POA: Diagnosis not present

## 2019-11-13 DIAGNOSIS — Z9181 History of falling: Secondary | ICD-10-CM | POA: Diagnosis not present

## 2019-11-13 DIAGNOSIS — I2510 Atherosclerotic heart disease of native coronary artery without angina pectoris: Secondary | ICD-10-CM | POA: Diagnosis not present

## 2019-11-13 DIAGNOSIS — L03113 Cellulitis of right upper limb: Secondary | ICD-10-CM | POA: Diagnosis not present

## 2019-11-13 DIAGNOSIS — N183 Chronic kidney disease, stage 3 unspecified: Secondary | ICD-10-CM | POA: Diagnosis not present

## 2019-11-13 DIAGNOSIS — Z794 Long term (current) use of insulin: Secondary | ICD-10-CM | POA: Diagnosis not present

## 2019-11-13 DIAGNOSIS — K219 Gastro-esophageal reflux disease without esophagitis: Secondary | ICD-10-CM | POA: Diagnosis not present

## 2019-11-13 DIAGNOSIS — Z87891 Personal history of nicotine dependence: Secondary | ICD-10-CM | POA: Diagnosis not present

## 2019-11-13 DIAGNOSIS — E1122 Type 2 diabetes mellitus with diabetic chronic kidney disease: Secondary | ICD-10-CM | POA: Diagnosis not present

## 2019-11-13 DIAGNOSIS — I252 Old myocardial infarction: Secondary | ICD-10-CM | POA: Diagnosis not present

## 2019-11-13 DIAGNOSIS — M109 Gout, unspecified: Secondary | ICD-10-CM | POA: Diagnosis not present

## 2019-11-16 DIAGNOSIS — E1122 Type 2 diabetes mellitus with diabetic chronic kidney disease: Secondary | ICD-10-CM | POA: Diagnosis not present

## 2019-11-16 DIAGNOSIS — Z96653 Presence of artificial knee joint, bilateral: Secondary | ICD-10-CM | POA: Diagnosis not present

## 2019-11-16 DIAGNOSIS — I252 Old myocardial infarction: Secondary | ICD-10-CM | POA: Diagnosis not present

## 2019-11-16 DIAGNOSIS — Z87891 Personal history of nicotine dependence: Secondary | ICD-10-CM | POA: Diagnosis not present

## 2019-11-16 DIAGNOSIS — K219 Gastro-esophageal reflux disease without esophagitis: Secondary | ICD-10-CM | POA: Diagnosis not present

## 2019-11-16 DIAGNOSIS — N183 Chronic kidney disease, stage 3 unspecified: Secondary | ICD-10-CM | POA: Diagnosis not present

## 2019-11-16 DIAGNOSIS — I130 Hypertensive heart and chronic kidney disease with heart failure and stage 1 through stage 4 chronic kidney disease, or unspecified chronic kidney disease: Secondary | ICD-10-CM | POA: Diagnosis not present

## 2019-11-16 DIAGNOSIS — M109 Gout, unspecified: Secondary | ICD-10-CM | POA: Diagnosis not present

## 2019-11-16 DIAGNOSIS — I459 Conduction disorder, unspecified: Secondary | ICD-10-CM | POA: Diagnosis not present

## 2019-11-16 DIAGNOSIS — Z955 Presence of coronary angioplasty implant and graft: Secondary | ICD-10-CM | POA: Diagnosis not present

## 2019-11-16 DIAGNOSIS — Z794 Long term (current) use of insulin: Secondary | ICD-10-CM | POA: Diagnosis not present

## 2019-11-16 DIAGNOSIS — I2510 Atherosclerotic heart disease of native coronary artery without angina pectoris: Secondary | ICD-10-CM | POA: Diagnosis not present

## 2019-11-16 DIAGNOSIS — Z7982 Long term (current) use of aspirin: Secondary | ICD-10-CM | POA: Diagnosis not present

## 2019-11-16 DIAGNOSIS — I5032 Chronic diastolic (congestive) heart failure: Secondary | ICD-10-CM | POA: Diagnosis not present

## 2019-11-16 DIAGNOSIS — L03113 Cellulitis of right upper limb: Secondary | ICD-10-CM | POA: Diagnosis not present

## 2019-11-16 DIAGNOSIS — G4733 Obstructive sleep apnea (adult) (pediatric): Secondary | ICD-10-CM | POA: Diagnosis not present

## 2019-11-16 DIAGNOSIS — Z9181 History of falling: Secondary | ICD-10-CM | POA: Diagnosis not present

## 2019-11-17 ENCOUNTER — Telehealth: Payer: Self-pay | Admitting: Cardiovascular Disease

## 2019-11-17 DIAGNOSIS — I10 Essential (primary) hypertension: Secondary | ICD-10-CM | POA: Diagnosis not present

## 2019-11-17 NOTE — Telephone Encounter (Signed)
Noted  

## 2019-11-17 NOTE — Telephone Encounter (Signed)
Please give pt a call concerning BP medications   [phone]

## 2019-11-17 NOTE — Telephone Encounter (Addendum)
Pt called to notify Dr. Bronson Ing that his PCP started him on Clonidine 0.1 mg  two times daily.

## 2019-11-19 DIAGNOSIS — K219 Gastro-esophageal reflux disease without esophagitis: Secondary | ICD-10-CM | POA: Diagnosis not present

## 2019-11-19 DIAGNOSIS — I459 Conduction disorder, unspecified: Secondary | ICD-10-CM | POA: Diagnosis not present

## 2019-11-19 DIAGNOSIS — Z794 Long term (current) use of insulin: Secondary | ICD-10-CM | POA: Diagnosis not present

## 2019-11-19 DIAGNOSIS — I2510 Atherosclerotic heart disease of native coronary artery without angina pectoris: Secondary | ICD-10-CM | POA: Diagnosis not present

## 2019-11-19 DIAGNOSIS — Z87891 Personal history of nicotine dependence: Secondary | ICD-10-CM | POA: Diagnosis not present

## 2019-11-19 DIAGNOSIS — Z9181 History of falling: Secondary | ICD-10-CM | POA: Diagnosis not present

## 2019-11-19 DIAGNOSIS — I130 Hypertensive heart and chronic kidney disease with heart failure and stage 1 through stage 4 chronic kidney disease, or unspecified chronic kidney disease: Secondary | ICD-10-CM | POA: Diagnosis not present

## 2019-11-19 DIAGNOSIS — Z955 Presence of coronary angioplasty implant and graft: Secondary | ICD-10-CM | POA: Diagnosis not present

## 2019-11-19 DIAGNOSIS — Z96653 Presence of artificial knee joint, bilateral: Secondary | ICD-10-CM | POA: Diagnosis not present

## 2019-11-19 DIAGNOSIS — I5032 Chronic diastolic (congestive) heart failure: Secondary | ICD-10-CM | POA: Diagnosis not present

## 2019-11-19 DIAGNOSIS — G4733 Obstructive sleep apnea (adult) (pediatric): Secondary | ICD-10-CM | POA: Diagnosis not present

## 2019-11-19 DIAGNOSIS — M109 Gout, unspecified: Secondary | ICD-10-CM | POA: Diagnosis not present

## 2019-11-19 DIAGNOSIS — E1122 Type 2 diabetes mellitus with diabetic chronic kidney disease: Secondary | ICD-10-CM | POA: Diagnosis not present

## 2019-11-19 DIAGNOSIS — N183 Chronic kidney disease, stage 3 unspecified: Secondary | ICD-10-CM | POA: Diagnosis not present

## 2019-11-19 DIAGNOSIS — I252 Old myocardial infarction: Secondary | ICD-10-CM | POA: Diagnosis not present

## 2019-11-19 DIAGNOSIS — L03113 Cellulitis of right upper limb: Secondary | ICD-10-CM | POA: Diagnosis not present

## 2019-11-19 DIAGNOSIS — Z7982 Long term (current) use of aspirin: Secondary | ICD-10-CM | POA: Diagnosis not present

## 2019-11-22 DIAGNOSIS — I2510 Atherosclerotic heart disease of native coronary artery without angina pectoris: Secondary | ICD-10-CM | POA: Diagnosis not present

## 2019-11-22 DIAGNOSIS — Z7982 Long term (current) use of aspirin: Secondary | ICD-10-CM | POA: Diagnosis not present

## 2019-11-22 DIAGNOSIS — Z96653 Presence of artificial knee joint, bilateral: Secondary | ICD-10-CM | POA: Diagnosis not present

## 2019-11-22 DIAGNOSIS — I5032 Chronic diastolic (congestive) heart failure: Secondary | ICD-10-CM | POA: Diagnosis not present

## 2019-11-22 DIAGNOSIS — E1122 Type 2 diabetes mellitus with diabetic chronic kidney disease: Secondary | ICD-10-CM | POA: Diagnosis not present

## 2019-11-22 DIAGNOSIS — Z9181 History of falling: Secondary | ICD-10-CM | POA: Diagnosis not present

## 2019-11-22 DIAGNOSIS — N183 Chronic kidney disease, stage 3 unspecified: Secondary | ICD-10-CM | POA: Diagnosis not present

## 2019-11-22 DIAGNOSIS — G894 Chronic pain syndrome: Secondary | ICD-10-CM | POA: Diagnosis not present

## 2019-11-22 DIAGNOSIS — G4733 Obstructive sleep apnea (adult) (pediatric): Secondary | ICD-10-CM | POA: Diagnosis not present

## 2019-11-22 DIAGNOSIS — Z955 Presence of coronary angioplasty implant and graft: Secondary | ICD-10-CM | POA: Diagnosis not present

## 2019-11-22 DIAGNOSIS — I459 Conduction disorder, unspecified: Secondary | ICD-10-CM | POA: Diagnosis not present

## 2019-11-22 DIAGNOSIS — M159 Polyosteoarthritis, unspecified: Secondary | ICD-10-CM | POA: Diagnosis not present

## 2019-11-22 DIAGNOSIS — I130 Hypertensive heart and chronic kidney disease with heart failure and stage 1 through stage 4 chronic kidney disease, or unspecified chronic kidney disease: Secondary | ICD-10-CM | POA: Diagnosis not present

## 2019-11-22 DIAGNOSIS — Z794 Long term (current) use of insulin: Secondary | ICD-10-CM | POA: Diagnosis not present

## 2019-11-22 DIAGNOSIS — I252 Old myocardial infarction: Secondary | ICD-10-CM | POA: Diagnosis not present

## 2019-11-22 DIAGNOSIS — M109 Gout, unspecified: Secondary | ICD-10-CM | POA: Diagnosis not present

## 2019-11-22 DIAGNOSIS — K219 Gastro-esophageal reflux disease without esophagitis: Secondary | ICD-10-CM | POA: Diagnosis not present

## 2019-11-22 DIAGNOSIS — Z87891 Personal history of nicotine dependence: Secondary | ICD-10-CM | POA: Diagnosis not present

## 2019-11-22 DIAGNOSIS — L03113 Cellulitis of right upper limb: Secondary | ICD-10-CM | POA: Diagnosis not present

## 2019-11-25 DIAGNOSIS — G4733 Obstructive sleep apnea (adult) (pediatric): Secondary | ICD-10-CM | POA: Diagnosis not present

## 2019-11-25 DIAGNOSIS — Z9181 History of falling: Secondary | ICD-10-CM | POA: Diagnosis not present

## 2019-11-25 DIAGNOSIS — I2510 Atherosclerotic heart disease of native coronary artery without angina pectoris: Secondary | ICD-10-CM | POA: Diagnosis not present

## 2019-11-25 DIAGNOSIS — E1122 Type 2 diabetes mellitus with diabetic chronic kidney disease: Secondary | ICD-10-CM | POA: Diagnosis not present

## 2019-11-25 DIAGNOSIS — Z7982 Long term (current) use of aspirin: Secondary | ICD-10-CM | POA: Diagnosis not present

## 2019-11-25 DIAGNOSIS — L03113 Cellulitis of right upper limb: Secondary | ICD-10-CM | POA: Diagnosis not present

## 2019-11-25 DIAGNOSIS — M109 Gout, unspecified: Secondary | ICD-10-CM | POA: Diagnosis not present

## 2019-11-25 DIAGNOSIS — I459 Conduction disorder, unspecified: Secondary | ICD-10-CM | POA: Diagnosis not present

## 2019-11-25 DIAGNOSIS — I252 Old myocardial infarction: Secondary | ICD-10-CM | POA: Diagnosis not present

## 2019-11-25 DIAGNOSIS — Z87891 Personal history of nicotine dependence: Secondary | ICD-10-CM | POA: Diagnosis not present

## 2019-11-25 DIAGNOSIS — I5032 Chronic diastolic (congestive) heart failure: Secondary | ICD-10-CM | POA: Diagnosis not present

## 2019-11-25 DIAGNOSIS — Z96653 Presence of artificial knee joint, bilateral: Secondary | ICD-10-CM | POA: Diagnosis not present

## 2019-11-25 DIAGNOSIS — Z794 Long term (current) use of insulin: Secondary | ICD-10-CM | POA: Diagnosis not present

## 2019-11-25 DIAGNOSIS — K219 Gastro-esophageal reflux disease without esophagitis: Secondary | ICD-10-CM | POA: Diagnosis not present

## 2019-11-25 DIAGNOSIS — Z955 Presence of coronary angioplasty implant and graft: Secondary | ICD-10-CM | POA: Diagnosis not present

## 2019-11-25 DIAGNOSIS — I130 Hypertensive heart and chronic kidney disease with heart failure and stage 1 through stage 4 chronic kidney disease, or unspecified chronic kidney disease: Secondary | ICD-10-CM | POA: Diagnosis not present

## 2019-11-25 DIAGNOSIS — N183 Chronic kidney disease, stage 3 unspecified: Secondary | ICD-10-CM | POA: Diagnosis not present

## 2019-11-29 ENCOUNTER — Other Ambulatory Visit: Payer: Self-pay

## 2019-11-29 ENCOUNTER — Encounter: Payer: Self-pay | Admitting: Neurology

## 2019-11-30 DIAGNOSIS — Z87891 Personal history of nicotine dependence: Secondary | ICD-10-CM | POA: Diagnosis not present

## 2019-11-30 DIAGNOSIS — Z9181 History of falling: Secondary | ICD-10-CM | POA: Diagnosis not present

## 2019-11-30 DIAGNOSIS — Z794 Long term (current) use of insulin: Secondary | ICD-10-CM | POA: Diagnosis not present

## 2019-11-30 DIAGNOSIS — E1122 Type 2 diabetes mellitus with diabetic chronic kidney disease: Secondary | ICD-10-CM | POA: Diagnosis not present

## 2019-11-30 DIAGNOSIS — M109 Gout, unspecified: Secondary | ICD-10-CM | POA: Diagnosis not present

## 2019-11-30 DIAGNOSIS — I5032 Chronic diastolic (congestive) heart failure: Secondary | ICD-10-CM | POA: Diagnosis not present

## 2019-11-30 DIAGNOSIS — G4733 Obstructive sleep apnea (adult) (pediatric): Secondary | ICD-10-CM | POA: Diagnosis not present

## 2019-11-30 DIAGNOSIS — I459 Conduction disorder, unspecified: Secondary | ICD-10-CM | POA: Diagnosis not present

## 2019-11-30 DIAGNOSIS — Z96653 Presence of artificial knee joint, bilateral: Secondary | ICD-10-CM | POA: Diagnosis not present

## 2019-11-30 DIAGNOSIS — Z955 Presence of coronary angioplasty implant and graft: Secondary | ICD-10-CM | POA: Diagnosis not present

## 2019-11-30 DIAGNOSIS — N183 Chronic kidney disease, stage 3 unspecified: Secondary | ICD-10-CM | POA: Diagnosis not present

## 2019-11-30 DIAGNOSIS — I252 Old myocardial infarction: Secondary | ICD-10-CM | POA: Diagnosis not present

## 2019-11-30 DIAGNOSIS — I2510 Atherosclerotic heart disease of native coronary artery without angina pectoris: Secondary | ICD-10-CM | POA: Diagnosis not present

## 2019-11-30 DIAGNOSIS — Z7982 Long term (current) use of aspirin: Secondary | ICD-10-CM | POA: Diagnosis not present

## 2019-11-30 DIAGNOSIS — L03113 Cellulitis of right upper limb: Secondary | ICD-10-CM | POA: Diagnosis not present

## 2019-11-30 DIAGNOSIS — I130 Hypertensive heart and chronic kidney disease with heart failure and stage 1 through stage 4 chronic kidney disease, or unspecified chronic kidney disease: Secondary | ICD-10-CM | POA: Diagnosis not present

## 2019-11-30 DIAGNOSIS — K219 Gastro-esophageal reflux disease without esophagitis: Secondary | ICD-10-CM | POA: Diagnosis not present

## 2019-12-01 ENCOUNTER — Ambulatory Visit: Payer: Medicare Other | Admitting: Endocrinology

## 2019-12-03 DIAGNOSIS — Z87891 Personal history of nicotine dependence: Secondary | ICD-10-CM | POA: Diagnosis not present

## 2019-12-03 DIAGNOSIS — N183 Chronic kidney disease, stage 3 unspecified: Secondary | ICD-10-CM | POA: Diagnosis not present

## 2019-12-03 DIAGNOSIS — I130 Hypertensive heart and chronic kidney disease with heart failure and stage 1 through stage 4 chronic kidney disease, or unspecified chronic kidney disease: Secondary | ICD-10-CM | POA: Diagnosis not present

## 2019-12-03 DIAGNOSIS — L03113 Cellulitis of right upper limb: Secondary | ICD-10-CM | POA: Diagnosis not present

## 2019-12-03 DIAGNOSIS — Z794 Long term (current) use of insulin: Secondary | ICD-10-CM | POA: Diagnosis not present

## 2019-12-03 DIAGNOSIS — Z7982 Long term (current) use of aspirin: Secondary | ICD-10-CM | POA: Diagnosis not present

## 2019-12-03 DIAGNOSIS — I2510 Atherosclerotic heart disease of native coronary artery without angina pectoris: Secondary | ICD-10-CM | POA: Diagnosis not present

## 2019-12-03 DIAGNOSIS — I5032 Chronic diastolic (congestive) heart failure: Secondary | ICD-10-CM | POA: Diagnosis not present

## 2019-12-03 DIAGNOSIS — G4733 Obstructive sleep apnea (adult) (pediatric): Secondary | ICD-10-CM | POA: Diagnosis not present

## 2019-12-03 DIAGNOSIS — Z955 Presence of coronary angioplasty implant and graft: Secondary | ICD-10-CM | POA: Diagnosis not present

## 2019-12-03 DIAGNOSIS — Z96653 Presence of artificial knee joint, bilateral: Secondary | ICD-10-CM | POA: Diagnosis not present

## 2019-12-03 DIAGNOSIS — M109 Gout, unspecified: Secondary | ICD-10-CM | POA: Diagnosis not present

## 2019-12-03 DIAGNOSIS — I459 Conduction disorder, unspecified: Secondary | ICD-10-CM | POA: Diagnosis not present

## 2019-12-03 DIAGNOSIS — K219 Gastro-esophageal reflux disease without esophagitis: Secondary | ICD-10-CM | POA: Diagnosis not present

## 2019-12-03 DIAGNOSIS — E1122 Type 2 diabetes mellitus with diabetic chronic kidney disease: Secondary | ICD-10-CM | POA: Diagnosis not present

## 2019-12-03 DIAGNOSIS — Z9181 History of falling: Secondary | ICD-10-CM | POA: Diagnosis not present

## 2019-12-03 DIAGNOSIS — I252 Old myocardial infarction: Secondary | ICD-10-CM | POA: Diagnosis not present

## 2019-12-07 DIAGNOSIS — M109 Gout, unspecified: Secondary | ICD-10-CM | POA: Diagnosis not present

## 2019-12-07 DIAGNOSIS — K219 Gastro-esophageal reflux disease without esophagitis: Secondary | ICD-10-CM | POA: Diagnosis not present

## 2019-12-07 DIAGNOSIS — E1122 Type 2 diabetes mellitus with diabetic chronic kidney disease: Secondary | ICD-10-CM | POA: Diagnosis not present

## 2019-12-07 DIAGNOSIS — L03113 Cellulitis of right upper limb: Secondary | ICD-10-CM | POA: Diagnosis not present

## 2019-12-07 DIAGNOSIS — Z9181 History of falling: Secondary | ICD-10-CM | POA: Diagnosis not present

## 2019-12-07 DIAGNOSIS — I459 Conduction disorder, unspecified: Secondary | ICD-10-CM | POA: Diagnosis not present

## 2019-12-07 DIAGNOSIS — I130 Hypertensive heart and chronic kidney disease with heart failure and stage 1 through stage 4 chronic kidney disease, or unspecified chronic kidney disease: Secondary | ICD-10-CM | POA: Diagnosis not present

## 2019-12-07 DIAGNOSIS — Z87891 Personal history of nicotine dependence: Secondary | ICD-10-CM | POA: Diagnosis not present

## 2019-12-07 DIAGNOSIS — Z955 Presence of coronary angioplasty implant and graft: Secondary | ICD-10-CM | POA: Diagnosis not present

## 2019-12-07 DIAGNOSIS — I252 Old myocardial infarction: Secondary | ICD-10-CM | POA: Diagnosis not present

## 2019-12-07 DIAGNOSIS — I2510 Atherosclerotic heart disease of native coronary artery without angina pectoris: Secondary | ICD-10-CM | POA: Diagnosis not present

## 2019-12-07 DIAGNOSIS — I5032 Chronic diastolic (congestive) heart failure: Secondary | ICD-10-CM | POA: Diagnosis not present

## 2019-12-07 DIAGNOSIS — G4733 Obstructive sleep apnea (adult) (pediatric): Secondary | ICD-10-CM | POA: Diagnosis not present

## 2019-12-07 DIAGNOSIS — N183 Chronic kidney disease, stage 3 unspecified: Secondary | ICD-10-CM | POA: Diagnosis not present

## 2019-12-07 DIAGNOSIS — Z7982 Long term (current) use of aspirin: Secondary | ICD-10-CM | POA: Diagnosis not present

## 2019-12-07 DIAGNOSIS — Z96653 Presence of artificial knee joint, bilateral: Secondary | ICD-10-CM | POA: Diagnosis not present

## 2019-12-07 DIAGNOSIS — Z794 Long term (current) use of insulin: Secondary | ICD-10-CM | POA: Diagnosis not present

## 2019-12-08 DIAGNOSIS — I10 Essential (primary) hypertension: Secondary | ICD-10-CM | POA: Diagnosis not present

## 2019-12-08 DIAGNOSIS — R809 Proteinuria, unspecified: Secondary | ICD-10-CM | POA: Diagnosis not present

## 2019-12-08 DIAGNOSIS — H8113 Benign paroxysmal vertigo, bilateral: Secondary | ICD-10-CM | POA: Diagnosis not present

## 2019-12-08 DIAGNOSIS — M1991 Primary osteoarthritis, unspecified site: Secondary | ICD-10-CM | POA: Diagnosis not present

## 2019-12-08 DIAGNOSIS — E559 Vitamin D deficiency, unspecified: Secondary | ICD-10-CM | POA: Diagnosis not present

## 2019-12-08 DIAGNOSIS — N1832 Chronic kidney disease, stage 3b: Secondary | ICD-10-CM | POA: Diagnosis not present

## 2019-12-08 DIAGNOSIS — D631 Anemia in chronic kidney disease: Secondary | ICD-10-CM | POA: Diagnosis not present

## 2019-12-08 DIAGNOSIS — Z79899 Other long term (current) drug therapy: Secondary | ICD-10-CM | POA: Diagnosis not present

## 2019-12-08 DIAGNOSIS — R42 Dizziness and giddiness: Secondary | ICD-10-CM | POA: Diagnosis not present

## 2019-12-13 DIAGNOSIS — Z96653 Presence of artificial knee joint, bilateral: Secondary | ICD-10-CM | POA: Diagnosis not present

## 2019-12-13 DIAGNOSIS — I5032 Chronic diastolic (congestive) heart failure: Secondary | ICD-10-CM | POA: Diagnosis not present

## 2019-12-13 DIAGNOSIS — E1122 Type 2 diabetes mellitus with diabetic chronic kidney disease: Secondary | ICD-10-CM | POA: Diagnosis not present

## 2019-12-13 DIAGNOSIS — I252 Old myocardial infarction: Secondary | ICD-10-CM | POA: Diagnosis not present

## 2019-12-13 DIAGNOSIS — I2510 Atherosclerotic heart disease of native coronary artery without angina pectoris: Secondary | ICD-10-CM | POA: Diagnosis not present

## 2019-12-13 DIAGNOSIS — M109 Gout, unspecified: Secondary | ICD-10-CM | POA: Diagnosis not present

## 2019-12-13 DIAGNOSIS — Z87891 Personal history of nicotine dependence: Secondary | ICD-10-CM | POA: Diagnosis not present

## 2019-12-13 DIAGNOSIS — I459 Conduction disorder, unspecified: Secondary | ICD-10-CM | POA: Diagnosis not present

## 2019-12-13 DIAGNOSIS — G4733 Obstructive sleep apnea (adult) (pediatric): Secondary | ICD-10-CM | POA: Diagnosis not present

## 2019-12-13 DIAGNOSIS — K219 Gastro-esophageal reflux disease without esophagitis: Secondary | ICD-10-CM | POA: Diagnosis not present

## 2019-12-13 DIAGNOSIS — Z9181 History of falling: Secondary | ICD-10-CM | POA: Diagnosis not present

## 2019-12-13 DIAGNOSIS — Z7982 Long term (current) use of aspirin: Secondary | ICD-10-CM | POA: Diagnosis not present

## 2019-12-13 DIAGNOSIS — Z794 Long term (current) use of insulin: Secondary | ICD-10-CM | POA: Diagnosis not present

## 2019-12-13 DIAGNOSIS — Z955 Presence of coronary angioplasty implant and graft: Secondary | ICD-10-CM | POA: Diagnosis not present

## 2019-12-13 DIAGNOSIS — I130 Hypertensive heart and chronic kidney disease with heart failure and stage 1 through stage 4 chronic kidney disease, or unspecified chronic kidney disease: Secondary | ICD-10-CM | POA: Diagnosis not present

## 2019-12-13 DIAGNOSIS — N183 Chronic kidney disease, stage 3 unspecified: Secondary | ICD-10-CM | POA: Diagnosis not present

## 2019-12-13 DIAGNOSIS — L03113 Cellulitis of right upper limb: Secondary | ICD-10-CM | POA: Diagnosis not present

## 2019-12-14 DIAGNOSIS — E559 Vitamin D deficiency, unspecified: Secondary | ICD-10-CM | POA: Diagnosis not present

## 2019-12-14 DIAGNOSIS — I5032 Chronic diastolic (congestive) heart failure: Secondary | ICD-10-CM | POA: Diagnosis not present

## 2019-12-14 DIAGNOSIS — N184 Chronic kidney disease, stage 4 (severe): Secondary | ICD-10-CM | POA: Insufficient documentation

## 2019-12-14 DIAGNOSIS — E211 Secondary hyperparathyroidism, not elsewhere classified: Secondary | ICD-10-CM | POA: Diagnosis not present

## 2019-12-14 DIAGNOSIS — R809 Proteinuria, unspecified: Secondary | ICD-10-CM | POA: Diagnosis not present

## 2019-12-14 DIAGNOSIS — N189 Chronic kidney disease, unspecified: Secondary | ICD-10-CM | POA: Diagnosis not present

## 2019-12-16 DIAGNOSIS — I5032 Chronic diastolic (congestive) heart failure: Secondary | ICD-10-CM | POA: Diagnosis not present

## 2019-12-16 DIAGNOSIS — E1122 Type 2 diabetes mellitus with diabetic chronic kidney disease: Secondary | ICD-10-CM | POA: Diagnosis not present

## 2019-12-16 DIAGNOSIS — L03113 Cellulitis of right upper limb: Secondary | ICD-10-CM | POA: Diagnosis not present

## 2019-12-16 DIAGNOSIS — I130 Hypertensive heart and chronic kidney disease with heart failure and stage 1 through stage 4 chronic kidney disease, or unspecified chronic kidney disease: Secondary | ICD-10-CM | POA: Diagnosis not present

## 2019-12-22 ENCOUNTER — Ambulatory Visit: Payer: Medicare Other | Admitting: Nutrition

## 2019-12-22 DIAGNOSIS — Z7982 Long term (current) use of aspirin: Secondary | ICD-10-CM | POA: Diagnosis not present

## 2019-12-22 DIAGNOSIS — Z87891 Personal history of nicotine dependence: Secondary | ICD-10-CM | POA: Diagnosis not present

## 2019-12-22 DIAGNOSIS — K219 Gastro-esophageal reflux disease without esophagitis: Secondary | ICD-10-CM | POA: Diagnosis not present

## 2019-12-22 DIAGNOSIS — Z9181 History of falling: Secondary | ICD-10-CM | POA: Diagnosis not present

## 2019-12-22 DIAGNOSIS — E1122 Type 2 diabetes mellitus with diabetic chronic kidney disease: Secondary | ICD-10-CM | POA: Diagnosis not present

## 2019-12-22 DIAGNOSIS — G4733 Obstructive sleep apnea (adult) (pediatric): Secondary | ICD-10-CM | POA: Diagnosis not present

## 2019-12-22 DIAGNOSIS — I252 Old myocardial infarction: Secondary | ICD-10-CM | POA: Diagnosis not present

## 2019-12-22 DIAGNOSIS — I459 Conduction disorder, unspecified: Secondary | ICD-10-CM | POA: Diagnosis not present

## 2019-12-22 DIAGNOSIS — I130 Hypertensive heart and chronic kidney disease with heart failure and stage 1 through stage 4 chronic kidney disease, or unspecified chronic kidney disease: Secondary | ICD-10-CM | POA: Diagnosis not present

## 2019-12-22 DIAGNOSIS — Z96653 Presence of artificial knee joint, bilateral: Secondary | ICD-10-CM | POA: Diagnosis not present

## 2019-12-22 DIAGNOSIS — L03113 Cellulitis of right upper limb: Secondary | ICD-10-CM | POA: Diagnosis not present

## 2019-12-22 DIAGNOSIS — M109 Gout, unspecified: Secondary | ICD-10-CM | POA: Diagnosis not present

## 2019-12-22 DIAGNOSIS — Z794 Long term (current) use of insulin: Secondary | ICD-10-CM | POA: Diagnosis not present

## 2019-12-22 DIAGNOSIS — N183 Chronic kidney disease, stage 3 unspecified: Secondary | ICD-10-CM | POA: Diagnosis not present

## 2019-12-22 DIAGNOSIS — I5032 Chronic diastolic (congestive) heart failure: Secondary | ICD-10-CM | POA: Diagnosis not present

## 2019-12-22 DIAGNOSIS — Z955 Presence of coronary angioplasty implant and graft: Secondary | ICD-10-CM | POA: Diagnosis not present

## 2019-12-22 DIAGNOSIS — I2510 Atherosclerotic heart disease of native coronary artery without angina pectoris: Secondary | ICD-10-CM | POA: Diagnosis not present

## 2019-12-23 DIAGNOSIS — I10 Essential (primary) hypertension: Secondary | ICD-10-CM | POA: Diagnosis not present

## 2019-12-23 DIAGNOSIS — M1991 Primary osteoarthritis, unspecified site: Secondary | ICD-10-CM | POA: Diagnosis not present

## 2019-12-29 ENCOUNTER — Ambulatory Visit (INDEPENDENT_AMBULATORY_CARE_PROVIDER_SITE_OTHER): Payer: Medicare Other | Admitting: Endocrinology

## 2019-12-29 ENCOUNTER — Encounter: Payer: Self-pay | Admitting: Endocrinology

## 2019-12-29 ENCOUNTER — Other Ambulatory Visit: Payer: Self-pay

## 2019-12-29 VITALS — BP 140/70 | HR 75 | Ht 74.0 in | Wt 290.8 lb

## 2019-12-29 DIAGNOSIS — E669 Obesity, unspecified: Secondary | ICD-10-CM | POA: Diagnosis not present

## 2019-12-29 DIAGNOSIS — E1169 Type 2 diabetes mellitus with other specified complication: Secondary | ICD-10-CM | POA: Diagnosis not present

## 2019-12-29 LAB — POCT GLYCOSYLATED HEMOGLOBIN (HGB A1C): Hemoglobin A1C: 7.3 % — AB (ref 4.0–5.6)

## 2019-12-29 MED ORDER — INSULIN NPH (HUMAN) (ISOPHANE) 100 UNIT/ML ~~LOC~~ SUSP: 20.0000 [IU] | SUBCUTANEOUS | 11 refills | Status: DC

## 2019-12-29 NOTE — Progress Notes (Signed)
Subjective:    Patient ID: [name], male    DOB: Mar 02, 1946, 74 y.o.   MRN: [id number]  HPI Pt returns for f/u of diabetes mellitus: DM type: Insulin-requiring type 2 Dx'ed: 4765 Complications: CAD and renal failure Therapy: insulin since 2020 DKA: never Severe hypoglycemia: never Pancreatitis: never Pancreatic imaging: normal on 2017 CT Other: He did not tolerate metformin (nausea); he declines multiple daily injections. Interval history: no cbg record, but states fasting cbg's vary from 122-160.  pt states he feels well in general.  Pt says he never misses the insulin.  Past Medical History:  Diagnosis Date  . Anxiety   . Benign prostatic hypertrophy    Nocturia  . CAD (coronary artery disease)    a.  NSTEMI 10/2012 s/p DES to LAD & DES to 1st diagonal with residual diffuse nonobstructive dz in LCx/RCA   . Chronic kidney disease, stage 3, mod decreased GFR    Creatinine of 1.5 in 03/2009  . Degenerative joint disease   . Depression    hx of  . Diabetes mellitus, type II ([place]) 12/10/2012.  . Diastolic heart failure    Pedal edema, "a long time ago"  . Erectile dysfunction   . Flu 2013   hx of  . GERD (gastroesophageal reflux disease)   . Hemorrhoids   . High cholesterol   . Hypertension    Exercise induced  . NSVT (nonsustained ventricular tachycardia) (HCC)    Exercise induced  . Obesity   . 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 Tubular adenoma 2014    Past Surgical History:  Procedure Laterality Date  . BACK SURGERY  2007   x2  . CARDIAC CATHETERIZATION     with stent placement  . COLONOSCOPY WITH ESOPHAGOGASTRODUODENOSCOPY (EGD) N/A 11/23/2013   Dr. Oneida Alar- TCS= left sided colitis and mild proctitis, moderate internal hemorrhoids, small nodule in the rectum, bx=tubular adenoma, EGD=-mild non-erosive gastritis  . HERNIA REPAIR    . LEFT HEART CATHETERIZATION WITH CORONARY ANGIOGRAM N/A 11/16/2012   Procedure: LEFT HEART  CATHETERIZATION WITH CORONARY ANGIOGRAM;  Surgeon: Sherren Mocha, MD;  Location: [facility] CATH LAB;  Service: Cardiovascular;  Laterality: N/A;  . PERCUTANEOUS CORONARY STENT INTERVENTION (PCI-S)  11/16/2012   Procedure: PERCUTANEOUS CORONARY STENT INTERVENTION (PCI-S);  Surgeon: Sherren Mocha, MD;  Location: [facility] CATH LAB;  Service: Cardiovascular;;  . TOTAL KNEE ARTHROPLASTY Right 11/15/2013   Procedure: RIGHT TOTAL KNEE ARTHROPLASTY;  Surgeon: Mauri Pole, MD;  Location: [facility] ORS;  Service: Orthopedics;  Laterality: Right;  . TOTAL KNEE ARTHROPLASTY Left 03/07/2015   Procedure: LEFT TOTAL KNEE ARTHROPLASTY;  Surgeon: Paralee Cancel, MD;  Location: [facility] ORS;  Service: Orthopedics;  Laterality: Left;    Social History   Socioeconomic History  . Marital status: Widowed    Spouse name: Not on file  . Number of children: Not on file  . Years of education: Not on file  . Highest education level: Not on file  Occupational History  . Occupation: Disabled    Employer: RETIRED  Tobacco Use  . Smoking status: Former Smoker    Packs/day: 1.00    Years: 30.00    Pack years: 30.00    Types: Cigarettes    Start date: 01/22/1965    Quit date: 12/23/1992    Years since quitting: 27.0  . Smokeless tobacco: Never Used  Substance and Sexual Activity  . Alcohol use: No    Alcohol/week: 0.0 standard drinks  .  Drug use: No  . Sexual activity: Not Currently  Other Topics Concern  . Not on file  Social History Narrative   Married   Social Determinants of Health   Financial Resource Strain:   . Difficulty of Paying Living Expenses: Not on file  Food Insecurity:   . Worried About Charity fundraiser in the Last Year: Not on file  . Ran Out of Food in the Last Year: Not on file  Transportation Needs:   . Lack of Transportation (Medical): Not on file  . Lack of Transportation (Non-Medical): Not on file  Physical Activity:   . Days of Exercise per Week: Not on file  . Minutes of Exercise per Session: Not  on file  Stress:   . Feeling of Stress : Not on file  Social Connections:   . Frequency of Communication with Friends and Family: Not on file  . Frequency of Social Gatherings with Friends and Family: Not on file  . Attends Religious Services: Not on file  . Active Member of Clubs or Organizations: Not on file  . Attends Archivist Meetings: Not on file  . Marital Status: Not on file  Intimate Partner Violence:   . Fear of Current or Ex-Partner: Not on file  . Emotionally Abused: Not on file  . Physically Abused: Not on file  . Sexually Abused: Not on file    Current Outpatient Medications on File Prior to Visit  Medication Sig Dispense Refill  . acarbose (PRECOSE) 50 MG tablet Take 50 mg by mouth 3 (three) times daily with meals.     Marland Kitchen amLODipine (NORVASC) 10 MG tablet Take 10 mg by mouth daily.    Marland Kitchen aspirin EC 81 MG tablet Take 1 tablet (81 mg total) by mouth daily. 90 tablet 3  . calcitRIOL (ROCALTROL) 0.25 MCG capsule Take 1 capsule by mouth daily.    . cholecalciferol (VITAMIN D) 1000 units tablet Take 1,000 Units daily by mouth.    . docusate sodium (COLACE) 100 MG capsule Take 100 mg by mouth daily as needed for mild constipation.    . ferrous sulfate 325 (65 FE) MG tablet Take 1 tablet (325 mg total) by mouth 3 (three) times daily after meals.  3  . furosemide (LASIX) 40 MG tablet Take 1 tablet (40 mg total) by mouth 2 (two) times daily. 60 tablet 3  . gabapentin (NEURONTIN) 300 MG capsule Take 1 capsule by mouth 3 (three) times daily.     Marland Kitchen HYDROcodone-acetaminophen (NORCO) 10-325 MG tablet Take 1-2 tablets by mouth 4 (four) times daily as needed for moderate pain or severe pain. 10 tablet 0  . lactulose (CHRONULAC) 10 GM/15ML solution Take 10 g by mouth 3 (three) times daily as needed for mild constipation.    . lidocaine (LIDODERM) 5 % Place 1 patch onto the skin daily. Remove & Discard patch within 12 hours or as directed by MD 30 patch 0  . nitroGLYCERIN  (NITROSTAT) 0.4 MG SL tablet PLACE ONE TABLET UNDER THE TONGUE EVERY 5 MINUTES FOR 3 DOSES AS NEEDED (Patient taking differently: Place 0.4 mg under the tongue every 5 (five) minutes as needed. ) 25 tablet 3  . omeprazole (PRILOSEC) 20 MG capsule Take 20 mg by mouth daily.    Marland Kitchen allopurinol (ZYLOPRIM) 100 MG tablet Take 1 tablet (100 mg total) by mouth daily. 30 tablet 2  . pravastatin (PRAVACHOL) 20 MG tablet Take 1 tablet (20 mg total) by mouth every evening. [place]  tablet 3   No current facility-administered medications on file prior to visit.    Allergies  Allergen Reactions  . Niacin And Related Other (See Comments)    Reaction: flushing and burning side effects even with extended release    Family History  Problem Relation Age of Onset  . Hypertension Other   . Cancer Other   . Heart disease Other   . Diabetes Other   . Hypertension Mother   . Cancer Father   . CAD Brother   . Colon cancer Neg Hx     BP 140/70 (BP Location: Right Arm, Patient Position: Sitting, Cuff Size: Large)   Pulse 75   Ht 6' 2"  (1.88 m)   Wt 290 lb 12.8 oz (131.9 kg)   SpO2 96%   BMI 37.34 kg/m    Review of Systems He denies hypoglycemia    Objective:   Physical Exam VITAL SIGNS:  See vs page GENERAL: no distress Pulses: dorsalis pedis intact bilat.   MSK: no deformity of the feet CV: 2+ bilat leg edema Skin:  no ulcer on the feet.  normal color and temp on the feet.  Neuro: sensation is intact to touch on the feet, but decreased from normal.    Ext: there is bilateral onychomycosis of the toenails.      Lab Results  Component Value Date   CREATININE 2.02 (H) 10/28/2019   BUN 64 (H) 10/28/2019   NA 139 10/28/2019   K 4.1 10/28/2019   CL 110 10/28/2019   CO2 22 10/28/2019   Lab Results  Component Value Date   HGBA1C 7.3 (A) 12/29/2019      Assessment & Plan:  Insulin-requiring type 2 DM, with CAD: She would benefit from increased rx, if it can be done with a regimen that avoids  or minimizes hypoglycemia.  Renal failure: in this setting, he needs an intermediate-acting qam insulin.   Patient Instructions  check your blood sugar twice a day.  vary the time of day when you check, between before the 3 meals, and at bedtime.  also check if you have symptoms of your blood sugar being too high or too low.  please keep a record of the readings and bring it to your next appointment here (or you can bring the meter itself).  You can write it on any piece of paper.  please call us sooner if your blood sugar goes below 70, or if you have a lot of readings over 200.  Please increase the NPH insulin to 20 units each morning.   Please come back for a follow-up appointment in 2 months.

## 2019-12-29 NOTE — Patient Instructions (Addendum)
check your blood sugar twice a day.  vary the time of day when you check, between before the 3 meals, and at bedtime.  also check if you have symptoms of your blood sugar being too high or too low.  please keep a record of the readings and bring it to your next appointment here (or you can bring the meter itself).  You can write it on any piece of paper.  please call us sooner if your blood sugar goes below 70, or if you have a lot of readings over 200.  Please increase the NPH insulin to 20 units each morning.   Please come back for a follow-up appointment in 2 months.

## 2020-01-17 NOTE — Progress Notes (Signed)
NEUROLOGY CONSULTATION NOTE  VAL SCHIAVO MRN: [id number] DOB: 08/19/1946  Referring provider: Collene Mares, PA-C Primary care provider: [employer], MD  Reason for consult:  dizziness  HISTORY OF PRESENT ILLNESS: [name]. [name] is a 74 year old male with CAD, type 2 diabetes mellitus, CKD stage 3, hypertension, and high cholesterol who presents for dizziness.  History supplemented by referring provider note.  He reports vertigo dating back to a year.  No preceding illness or head injury.  Initially, he described it as spinning sensation.  Patient has difficulty describing it.  It seems like it was aggravated by change in position but states it was fairly constant.  He was seen in the ED on 11/27/2018.  CT head without contrast was personally reviewed and was unremarkable.  He was treated with Ativan and meclizine.  He saw ENT, Dr. Benjamine Mola, in January 2020 who diagnosed vertigo and prescribed vestibular rehab.  Due to persistent symptoms, he returned to the ED on 02/19/2019.  CT of head performed again was personally reviewed and was unremarkable.  He subsequently started vestibular rehab and started performing Epley maneuver at home.  Symptoms improved but he couldn't continue the exercises because of chronic neck pain.  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  The dizziness returned but now it is more of a woozy sensation only when he stands.  He is unsteady on his feet.  He also notes ringing in the ears.  He reports that his ears feel stuffed up.  Denies headache, visual disturbance or unilateral numbness or weakness.    Labs: 12/29/2019:  Hgb A1c 7.3 10/28/2019:  CBC with WBC 6.3, HGB 9.5, HCT 30.5, MCV 101.7, PLT 203; CMP with Na 139, K 4.1, Cl 110, CO2 22, glucose 171, BUN 64, Cr 2.02, t bili 0.5, ALP 46, AST 12, ALT 13, GFR 37.  PAST MEDICAL HISTORY: Past Medical History:  Diagnosis Date  . Anxiety   . Benign prostatic hypertrophy    Nocturia  . CAD (coronary artery  disease)    a.  NSTEMI 10/2012 s/p DES to LAD & DES to 1st diagonal with residual diffuse nonobstructive dz in LCx/RCA   . Chronic kidney disease, stage 3, mod decreased GFR    Creatinine of 1.5 in 03/2009  . Degenerative joint disease   . Depression    hx of  . Diabetes mellitus, type II ([place]) 12/10/2012.  . Diastolic heart failure    Pedal edema, "a long time ago"  . Erectile dysfunction   . Flu 2013   hx of  . GERD (gastroesophageal reflux disease)   . Hemorrhoids   . High cholesterol   . Hypertension    Exercise induced  . NSVT (nonsustained ventricular tachycardia) (HCC)    Exercise induced  . Obesity   . 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 Tubular adenoma 2014    PAST SURGICAL HISTORY: Past Surgical History:  Procedure Laterality Date  . BACK SURGERY  2007   x2  . CARDIAC CATHETERIZATION     with stent placement  . COLONOSCOPY WITH ESOPHAGOGASTRODUODENOSCOPY (EGD) N/A 11/23/2013   Dr. Oneida Alar- TCS= left sided colitis and mild proctitis, moderate internal hemorrhoids, small nodule in the rectum, bx=tubular adenoma, EGD=-mild non-erosive gastritis  . HERNIA REPAIR    . LEFT HEART CATHETERIZATION WITH CORONARY ANGIOGRAM N/A 11/16/2012   Procedure: LEFT HEART CATHETERIZATION WITH CORONARY ANGIOGRAM;  Surgeon: Sherren Mocha, MD;  Location: [facility] CATH  LAB;  Service: Cardiovascular;  Laterality: N/A;  . PERCUTANEOUS CORONARY STENT INTERVENTION (PCI-S)  11/16/2012   Procedure: PERCUTANEOUS CORONARY STENT INTERVENTION (PCI-S);  Surgeon: Sherren Mocha, MD;  Location: [facility] CATH LAB;  Service: Cardiovascular;;  . TOTAL KNEE ARTHROPLASTY Right 11/15/2013   Procedure: RIGHT TOTAL KNEE ARTHROPLASTY;  Surgeon: Mauri Pole, MD;  Location: [facility] ORS;  Service: Orthopedics;  Laterality: Right;  . TOTAL KNEE ARTHROPLASTY Left 03/07/2015   Procedure: LEFT TOTAL KNEE ARTHROPLASTY;  Surgeon: Paralee Cancel, MD;  Location: [facility] ORS;  Service: Orthopedics;  Laterality: Left;     MEDICATIONS: Current Outpatient Medications on File Prior to Visit  Medication Sig Dispense Refill  . acarbose (PRECOSE) 50 MG tablet Take 50 mg by mouth 3 (three) times daily with meals.     Marland Kitchen allopurinol (ZYLOPRIM) 100 MG tablet Take 1 tablet (100 mg total) by mouth daily. 30 tablet 2  . amLODipine (NORVASC) 10 MG tablet Take 10 mg by mouth daily.    Marland Kitchen aspirin EC 81 MG tablet Take 1 tablet (81 mg total) by mouth daily. 90 tablet 3  . calcitRIOL (ROCALTROL) 0.25 MCG capsule Take 1 capsule by mouth daily.    . cholecalciferol (VITAMIN D) 1000 units tablet Take 1,000 Units daily by mouth.    . docusate sodium (COLACE) 100 MG capsule Take 100 mg by mouth daily as needed for mild constipation.    . ferrous sulfate 325 (65 FE) MG tablet Take 1 tablet (325 mg total) by mouth 3 (three) times daily after meals.  3  . furosemide (LASIX) 40 MG tablet Take 1 tablet (40 mg total) by mouth 2 (two) times daily. 60 tablet 3  . gabapentin (NEURONTIN) 300 MG capsule Take 1 capsule by mouth 3 (three) times daily.     Marland Kitchen HYDROcodone-acetaminophen (NORCO) 10-325 MG tablet Take 1-2 tablets by mouth 4 (four) times daily as needed for moderate pain or severe pain. 10 tablet 0  . insulin NPH Human (NOVOLIN N) 100 UNIT/ML injection Inject 0.2 mLs (20 Units total) into the skin every morning. Ands syringes 1/day 10 mL 11  . lactulose (CHRONULAC) 10 GM/15ML solution Take 10 g by mouth 3 (three) times daily as needed for mild constipation.    . lidocaine (LIDODERM) 5 % Place 1 patch onto the skin daily. Remove & Discard patch within 12 hours or as directed by MD 30 patch 0  . nitroGLYCERIN (NITROSTAT) 0.4 MG SL tablet PLACE ONE TABLET UNDER THE TONGUE EVERY 5 MINUTES FOR 3 DOSES AS NEEDED (Patient taking differently: Place 0.4 mg under the tongue every 5 (five) minutes as needed. ) 25 tablet 3  . omeprazole (PRILOSEC) 20 MG capsule Take 20 mg by mouth daily.    . pravastatin (PRAVACHOL) 20 MG tablet Take 1 tablet (20  mg total) by mouth every evening. 90 tablet 3   No current facility-administered medications on file prior to visit.    ALLERGIES: Allergies  Allergen Reactions  . Niacin And Related Other (See Comments)    Reaction: flushing and burning side effects even with extended release    FAMILY HISTORY: Family History  Problem Relation Age of Onset  . Hypertension Other   . Cancer Other   . Heart disease Other   . Diabetes Other   . Hypertension Mother   . Cancer Father   . CAD Brother   . Colon cancer Neg Hx     SOCIAL HISTORY: Social History   Socioeconomic History  . Marital status: Widowed  Spouse name: Not on file  . Number of children: Not on file  . Years of education: Not on file  . Highest education level: Not on file  Occupational History  . Occupation: Disabled    Employer: RETIRED  Tobacco Use  . Smoking status: Former Smoker    Packs/day: 1.00    Years: 30.00    Pack years: 30.00    Types: Cigarettes    Start date: 01/22/1965    Quit date: 12/23/1992    Years since quitting: 27.0  . Smokeless tobacco: Never Used  Substance and Sexual Activity  . Alcohol use: No    Alcohol/week: 0.0 standard drinks  . Drug use: No  . Sexual activity: Not Currently  Other Topics Concern  . Not on file  Social History Narrative   Married   Social Determinants of Health   Financial Resource Strain:   . Difficulty of Paying Living Expenses: Not on file  Food Insecurity:   . Worried About Charity fundraiser in the Last Year: Not on file  . Ran Out of Food in the Last Year: Not on file  Transportation Needs:   . Lack of Transportation (Medical): Not on file  . Lack of Transportation (Non-Medical): Not on file  Physical Activity:   . Days of Exercise per Week: Not on file  . Minutes of Exercise per Session: Not on file  Stress:   . Feeling of Stress : Not on file  Social Connections:   . Frequency of Communication with Friends and Family: Not on file  . Frequency  of Social Gatherings with Friends and Family: Not on file  . Attends Religious Services: Not on file  . Active Member of Clubs or Organizations: Not on file  . Attends Archivist Meetings: Not on file  . Marital Status: Not on file  Intimate Partner Violence:   . Fear of Current or Ex-Partner: Not on file  . Emotionally Abused: Not on file  . Physically Abused: Not on file  . Sexually Abused: Not on file    PHYSICAL EXAM: Blood pressure (!) 162/72, pulse 88, height 6' 2"  (1.88 m), weight 295 lb (133.8 kg), SpO2 98 %. General: No acute distress.  Patient appears well-groomed.  Head:  Normocephalic/atraumatic Eyes:  fundi examined but not visualized Neck: supple, no paraspinal tenderness, full range of motion Back: No paraspinal tenderness Heart: regular rate and rhythm Lungs: Clear to auscultation bilaterally. Vascular: No carotid bruits. Neurological Exam: Mental status: alert and oriented to person, place, and time, recent and remote memory intact, fund of knowledge intact, attention and concentration intact, speech fluent and not dysarthric, language intact. Cranial nerves: CN I: not tested CN II: pupils equal, round and reactive to light, visual fields intact CN III, IV, VI:  full range of motion, no nystagmus, no ptosis CN V: facial sensation intact CN VII: upper and lower face symmetric CN VIII: hearing intact CN IX, X: gag intact, uvula midline CN XI: sternocleidomastoid and trapezius muscles intact CN XII: tongue midline Bulk & Tone: normal, no fasciculations. Motor:  5/5 throughout  Sensation:  Pinprick and vibration sensation reduced up to knees. Deep Tendon Reflexes:  absent throughout, toes downgoing. Finger to nose testing:  Without dysmetria.  Heel to shin:  Without dysmetria.   Gait:  Wide-based gait.  Unsteady.  Romberg with mild sway but negative.Marland Kitchen  IMPRESSION: 1.  Dizziness.  Will evaluate for secondary intracranial abnormality (posterior fossa  mass lesion/vertebrobasilar insufficiency).  However, it  very well may be peripheral etiology such as BPPV as he responded to Epley maneuver, eustachian tube dysfunction (reports aural fullness), cervicogenic (endorses cervical spondylosis) or possibly persistent subjective dizziness.  If no intracranial mass or vertebrobasilar insufficiency, recommend that he continue Epley maneuver and treatment for chronic neck pain. 2.  HTN.  Follow up with PCP  Thank you for allowing me to take part in the care of this patient.  Metta Clines, DO  CC:  Collene Mares, PA-C  [employer], MD

## 2020-01-18 ENCOUNTER — Encounter: Payer: Self-pay | Admitting: Neurology

## 2020-01-18 ENCOUNTER — Ambulatory Visit: Payer: Medicare Other | Admitting: Neurology

## 2020-01-18 ENCOUNTER — Other Ambulatory Visit: Payer: Self-pay

## 2020-01-18 VITALS — BP 162/72 | HR 88 | Ht 74.0 in | Wt 295.0 lb

## 2020-01-18 DIAGNOSIS — I10 Essential (primary) hypertension: Secondary | ICD-10-CM

## 2020-01-18 DIAGNOSIS — G450 Vertebro-basilar artery syndrome: Secondary | ICD-10-CM | POA: Diagnosis not present

## 2020-01-18 DIAGNOSIS — R42 Dizziness and giddiness: Secondary | ICD-10-CM | POA: Diagnosis not present

## 2020-01-18 NOTE — Patient Instructions (Addendum)
1.  MRI and MRA of brain 2.  Further recommendations pending results. We have sent a referral to [facility] for your MRI and they will call you directly to schedule your appointment. They are located at [place]. If you need to contact them directly please call [phone]. The MRA of the brain will also be at [place].

## 2020-01-19 DIAGNOSIS — M159 Polyosteoarthritis, unspecified: Secondary | ICD-10-CM | POA: Diagnosis not present

## 2020-01-19 DIAGNOSIS — E1140 Type 2 diabetes mellitus with diabetic neuropathy, unspecified: Secondary | ICD-10-CM | POA: Diagnosis not present

## 2020-01-19 DIAGNOSIS — R600 Localized edema: Secondary | ICD-10-CM | POA: Diagnosis not present

## 2020-01-19 DIAGNOSIS — M5136 Other intervertebral disc degeneration, lumbar region: Secondary | ICD-10-CM | POA: Diagnosis not present

## 2020-01-19 DIAGNOSIS — Z1389 Encounter for screening for other disorder: Secondary | ICD-10-CM | POA: Diagnosis not present

## 2020-01-19 DIAGNOSIS — M179 Osteoarthritis of knee, unspecified: Secondary | ICD-10-CM | POA: Diagnosis not present

## 2020-01-19 DIAGNOSIS — Z0001 Encounter for general adult medical examination with abnormal findings: Secondary | ICD-10-CM | POA: Diagnosis not present

## 2020-01-19 DIAGNOSIS — G894 Chronic pain syndrome: Secondary | ICD-10-CM | POA: Diagnosis not present

## 2020-01-20 DIAGNOSIS — Z1389 Encounter for screening for other disorder: Secondary | ICD-10-CM | POA: Diagnosis not present

## 2020-01-20 DIAGNOSIS — E119 Type 2 diabetes mellitus without complications: Secondary | ICD-10-CM | POA: Diagnosis not present

## 2020-01-20 DIAGNOSIS — Z0001 Encounter for general adult medical examination with abnormal findings: Secondary | ICD-10-CM | POA: Diagnosis not present

## 2020-01-20 DIAGNOSIS — E1140 Type 2 diabetes mellitus with diabetic neuropathy, unspecified: Secondary | ICD-10-CM | POA: Diagnosis not present

## 2020-01-30 ENCOUNTER — Other Ambulatory Visit: Payer: Self-pay

## 2020-01-30 ENCOUNTER — Ambulatory Visit: Payer: Medicare Other | Attending: Internal Medicine

## 2020-01-30 DIAGNOSIS — Z23 Encounter for immunization: Secondary | ICD-10-CM | POA: Insufficient documentation

## 2020-01-30 NOTE — Progress Notes (Signed)
   Covid-19 Vaccination Clinic  Name:  [name]    MRN: [id number] DOB: 01/07/1946  01/30/2020  [name] was observed post Covid-19 immunization for 30 minutes based on pre-vaccination screening without incidence. He was provided with Vaccine Information Sheet and instruction to access the V-Safe system.   [name] was instructed to call 911 with any severe reactions post vaccine: Marland Kitchen Difficulty breathing  . Swelling of your face and throat  . A fast heartbeat  . A bad rash all over your body  . Dizziness and weakness    Immunizations Administered    Name Date Dose VIS Date Route   Moderna COVID-19 Vaccine 01/30/2020  3:56 PM 0.5 mL 11/23/2019 Intramuscular   Manufacturer: Moderna   Lot: [id number]   [place]: [id number]

## 2020-02-09 DIAGNOSIS — M159 Polyosteoarthritis, unspecified: Secondary | ICD-10-CM | POA: Diagnosis not present

## 2020-02-09 DIAGNOSIS — G894 Chronic pain syndrome: Secondary | ICD-10-CM | POA: Diagnosis not present

## 2020-02-28 ENCOUNTER — Ambulatory Visit (INDEPENDENT_AMBULATORY_CARE_PROVIDER_SITE_OTHER): Payer: Medicare Other | Admitting: Endocrinology

## 2020-02-28 ENCOUNTER — Other Ambulatory Visit: Payer: Self-pay

## 2020-02-28 ENCOUNTER — Encounter: Payer: Self-pay | Admitting: Endocrinology

## 2020-02-28 VITALS — BP 124/74 | HR 56 | Ht 74.0 in | Wt 291.6 lb

## 2020-02-28 DIAGNOSIS — E1169 Type 2 diabetes mellitus with other specified complication: Secondary | ICD-10-CM | POA: Diagnosis not present

## 2020-02-28 DIAGNOSIS — E669 Obesity, unspecified: Secondary | ICD-10-CM

## 2020-02-28 LAB — POCT GLYCOSYLATED HEMOGLOBIN (HGB A1C): Hemoglobin A1C: 7.2 % — AB (ref 4.0–5.6)

## 2020-02-28 NOTE — Patient Instructions (Addendum)
check your blood sugar twice a day.  vary the time of day when you check, between before the 3 meals, and at bedtime.  also check if you have symptoms of your blood sugar being too high or too low.  please keep a record of the readings and bring it to your next appointment here (or you can bring the meter itself).  You can write it on any piece of paper.  please call us sooner if your blood sugar goes below 70, or if you have a lot of readings over 200.  Please continue the same insulin Please come back for a follow-up appointment in 3 months.

## 2020-02-28 NOTE — Progress Notes (Signed)
Subjective:    Patient ID: [name], male    DOB: January 21, 1946, 74 y.o.   MRN: [id number]  HPI Pt returns for f/u of diabetes mellitus:  DM type: Insulin-requiring type 2 Dx'ed: 8676 Complications: CAD and CRI.   Therapy: insulin since 2020.  DKA: never Severe hypoglycemia: never.  Pancreatitis: never Pancreatic imaging: normal on 2017 CT SDOH: he declines brand name meds, due to cost.   Other: He did not tolerate metformin (nausea); he declines multiple daily injections.  Interval history: no cbg record, but states fasting cbg's vary from 112-160.  There is no trend throughout the day.  pt states he feels well in general.  Pt says he takes the insulin as rx'ed.   Past Medical History:  Diagnosis Date  . Anxiety   . Benign prostatic hypertrophy    Nocturia  . CAD (coronary artery disease)    a.  NSTEMI 10/2012 s/p DES to LAD & DES to 1st diagonal with residual diffuse nonobstructive dz in LCx/RCA   . Chronic kidney disease, stage 3, mod decreased GFR    Creatinine of 1.5 in 03/2009  . Degenerative joint disease   . Depression    hx of  . Diabetes mellitus, type II ([place]) 12/10/2012.  . Diastolic heart failure    Pedal edema, "a long time ago"  . Erectile dysfunction   . Flu 2013   hx of  . GERD (gastroesophageal reflux disease)   . Hemorrhoids   . High cholesterol   . Hypertension    Exercise induced  . NSVT (nonsustained ventricular tachycardia) (HCC)    Exercise induced  . Obesity   . 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 Tubular adenoma 2014    Past Surgical History:  Procedure Laterality Date  . BACK SURGERY  2007   x2  . CARDIAC CATHETERIZATION     with stent placement  . COLONOSCOPY WITH ESOPHAGOGASTRODUODENOSCOPY (EGD) N/A 11/23/2013   Dr. Oneida Alar- TCS= left sided colitis and mild proctitis, moderate internal hemorrhoids, small nodule in the rectum, bx=tubular adenoma, EGD=-mild non-erosive gastritis  . HERNIA REPAIR    . LEFT  HEART CATHETERIZATION WITH CORONARY ANGIOGRAM N/A 11/16/2012   Procedure: LEFT HEART CATHETERIZATION WITH CORONARY ANGIOGRAM;  Surgeon: Sherren Mocha, MD;  Location: [facility] CATH LAB;  Service: Cardiovascular;  Laterality: N/A;  . PERCUTANEOUS CORONARY STENT INTERVENTION (PCI-S)  11/16/2012   Procedure: PERCUTANEOUS CORONARY STENT INTERVENTION (PCI-S);  Surgeon: Sherren Mocha, MD;  Location: [facility] CATH LAB;  Service: Cardiovascular;;  . TOTAL KNEE ARTHROPLASTY Right 11/15/2013   Procedure: RIGHT TOTAL KNEE ARTHROPLASTY;  Surgeon: Mauri Pole, MD;  Location: [facility] ORS;  Service: Orthopedics;  Laterality: Right;  . TOTAL KNEE ARTHROPLASTY Left 03/07/2015   Procedure: LEFT TOTAL KNEE ARTHROPLASTY;  Surgeon: Paralee Cancel, MD;  Location: [facility] ORS;  Service: Orthopedics;  Laterality: Left;    Social History   Socioeconomic History  . Marital status: Widowed    Spouse name: Not on file  . Number of children: Not on file  . Years of education: Not on file  . Highest education level: Not on file  Occupational History  . Occupation: Disabled    Employer: RETIRED  Tobacco Use  . Smoking status: Former Smoker    Packs/day: 1.00    Years: 30.00    Pack years: 30.00    Types: Cigarettes    Start date: 01/22/1965    Quit date: 12/23/1992    Years since  quitting: 27.2  . Smokeless tobacco: Never Used  Substance and Sexual Activity  . Alcohol use: No    Alcohol/week: 0.0 standard drinks  . Drug use: No  . Sexual activity: Not Currently  Other Topics Concern  . Not on file  Social History Narrative   Married   Right handed   One story home no caffeine   Social Determinants of Health   Financial Resource Strain:   . Difficulty of Paying Living Expenses: Not on file  Food Insecurity:   . Worried About Charity fundraiser in the Last Year: Not on file  . Ran Out of Food in the Last Year: Not on file  Transportation Needs:   . Lack of Transportation (Medical): Not on file  . Lack of Transportation  (Non-Medical): Not on file  Physical Activity:   . Days of Exercise per Week: Not on file  . Minutes of Exercise per Session: Not on file  Stress:   . Feeling of Stress : Not on file  Social Connections:   . Frequency of Communication with Friends and Family: Not on file  . Frequency of Social Gatherings with Friends and Family: Not on file  . Attends Religious Services: Not on file  . Active Member of Clubs or Organizations: Not on file  . Attends Archivist Meetings: Not on file  . Marital Status: Not on file  Intimate Partner Violence:   . Fear of Current or Ex-Partner: Not on file  . Emotionally Abused: Not on file  . Physically Abused: Not on file  . Sexually Abused: Not on file    Current Outpatient Medications on File Prior to Visit  Medication Sig Dispense Refill  . acarbose (PRECOSE) 50 MG tablet Take 50 mg by mouth 3 (three) times daily with meals.     Marland Kitchen amLODipine (NORVASC) 10 MG tablet Take 10 mg by mouth daily.    Marland Kitchen aspirin EC 81 MG tablet Take 1 tablet (81 mg total) by mouth daily. 90 tablet 3  . calcitRIOL (ROCALTROL) 0.25 MCG capsule Take 1 capsule by mouth daily.    . cholecalciferol (VITAMIN D) 1000 units tablet Take 1,000 Units daily by mouth.    . docusate sodium (COLACE) 100 MG capsule Take 100 mg by mouth daily as needed for mild constipation.    . ferrous sulfate 325 (65 FE) MG tablet Take 1 tablet (325 mg total) by mouth 3 (three) times daily after meals.  3  . furosemide (LASIX) 40 MG tablet Take 1 tablet (40 mg total) by mouth 2 (two) times daily. 60 tablet 3  . gabapentin (NEURONTIN) 300 MG capsule Take 1 capsule by mouth 3 (three) times daily.     Marland Kitchen HYDROcodone-acetaminophen (NORCO) 10-325 MG tablet Take 1-2 tablets by mouth 4 (four) times daily as needed for moderate pain or severe pain. 10 tablet 0  . insulin NPH Human (NOVOLIN N) 100 UNIT/ML injection Inject 0.2 mLs (20 Units total) into the skin every morning. Ands syringes 1/day 10 mL 11  .  lactulose (CHRONULAC) 10 GM/15ML solution Take 10 g by mouth 3 (three) times daily as needed for mild constipation.    . lidocaine (LIDODERM) 5 % Place 1 patch onto the skin daily. Remove & Discard patch within 12 hours or as directed by MD 30 patch 0  . nitroGLYCERIN (NITROSTAT) 0.4 MG SL tablet PLACE ONE TABLET UNDER THE TONGUE EVERY 5 MINUTES FOR 3 DOSES AS NEEDED (Patient taking differently: Place 0.4 mg  under the tongue every 5 (five) minutes as needed. ) 25 tablet 3  . omeprazole (PRILOSEC) 20 MG capsule Take 20 mg by mouth daily.    Marland Kitchen allopurinol (ZYLOPRIM) 100 MG tablet Take 1 tablet (100 mg total) by mouth daily. 30 tablet 2  . pravastatin (PRAVACHOL) 20 MG tablet Take 1 tablet (20 mg total) by mouth every evening. 90 tablet 3   No current facility-administered medications on file prior to visit.    Allergies  Allergen Reactions  . Niacin And Related Other (See Comments)    Reaction: flushing and burning side effects even with extended release    Family History  Problem Relation Age of Onset  . Hypertension Other   . Cancer Other   . Heart disease Other   . Diabetes Other   . Hypertension Mother   . Cancer Father   . CAD Brother   . Colon cancer Neg Hx     BP 124/74 (BP Location: Left Arm, Patient Position: Sitting, Cuff Size: Large)   Pulse (!) 56   Ht 6' 2"  (1.88 m)   Wt 291 lb 9.6 oz (132.3 kg)   SpO2 97%   BMI 37.44 kg/m    Review of Systems He denies hypoglycemia.      Objective:   Physical Exam VITAL SIGNS:  See vs page GENERAL: no distress Pulses: dorsalis pedis intact bilat.   MSK: no deformity of the feet CV: 2+ bilat leg edema Skin:  no ulcer on the feet.  normal color and temp on the feet.  Old healed surgical scar on the right foot.   Neuro: sensation is intact to touch on the feet.   Ext: there is bilateral onychomycosis of the toenails  Lab Results  Component Value Date   HGBA1C 7.2 (A) 02/28/2020    Lab Results  Component Value Date    CREATININE 2.02 (H) 10/28/2019   BUN 64 (H) 10/28/2019   NA 139 10/28/2019   K 4.1 10/28/2019   CL 110 10/28/2019   CO2 22 10/28/2019       Assessment & Plan:  Insulin-requiring type 2 DM, with CAD: this is the best control this pt should aim for, given this regimen, which does match insulin to her changing needs throughout the day Renal failure: in this setting, he does not need a PM insulin dose. SDOH: This limits rx options  Patient Instructions  check your blood sugar twice a day.  vary the time of day when you check, between before the 3 meals, and at bedtime.  also check if you have symptoms of your blood sugar being too high or too low.  please keep a record of the readings and bring it to your next appointment here (or you can bring the meter itself).  You can write it on any piece of paper.  please call us sooner if your blood sugar goes below 70, or if you have a lot of readings over 200.  Please continue the same insulin Please come back for a follow-up appointment in 3 months.

## 2020-03-01 ENCOUNTER — Other Ambulatory Visit: Payer: Self-pay

## 2020-03-01 ENCOUNTER — Ambulatory Visit: Payer: Medicare Other | Attending: Internal Medicine

## 2020-03-01 DIAGNOSIS — Z23 Encounter for immunization: Secondary | ICD-10-CM

## 2020-03-01 NOTE — Progress Notes (Signed)
   Covid-19 Vaccination Clinic  Name:  [name]    MRN: [id number] DOB: 28-May-1946  03/01/2020  [name] was observed post Covid-19 immunization for 15 minutes without incident. He was provided with Vaccine Information Sheet and instruction to access the V-Safe system.   [name] was instructed to call 911 with any severe reactions post vaccine: Marland Kitchen Difficulty breathing  . Swelling of face and throat  . A fast heartbeat  . A bad rash all over body  . Dizziness and weakness   Immunizations Administered    Name Date Dose VIS Date Route   Moderna COVID-19 Vaccine 03/01/2020  3:43 PM 0.5 mL 11/23/2019 Intramuscular   Manufacturer: Moderna   Lot: [id number]   [place]: [id number]

## 2020-03-08 DIAGNOSIS — E211 Secondary hyperparathyroidism, not elsewhere classified: Secondary | ICD-10-CM | POA: Diagnosis not present

## 2020-03-08 DIAGNOSIS — R809 Proteinuria, unspecified: Secondary | ICD-10-CM | POA: Diagnosis not present

## 2020-03-08 DIAGNOSIS — N189 Chronic kidney disease, unspecified: Secondary | ICD-10-CM | POA: Diagnosis not present

## 2020-03-08 DIAGNOSIS — E1122 Type 2 diabetes mellitus with diabetic chronic kidney disease: Secondary | ICD-10-CM | POA: Diagnosis not present

## 2020-03-08 DIAGNOSIS — M542 Cervicalgia: Secondary | ICD-10-CM | POA: Diagnosis not present

## 2020-03-08 DIAGNOSIS — E1129 Type 2 diabetes mellitus with other diabetic kidney complication: Secondary | ICD-10-CM | POA: Diagnosis not present

## 2020-03-08 DIAGNOSIS — M5030 Other cervical disc degeneration, unspecified cervical region: Secondary | ICD-10-CM | POA: Diagnosis not present

## 2020-03-10 ENCOUNTER — Other Ambulatory Visit: Payer: Medicare Other

## 2020-03-21 ENCOUNTER — Ambulatory Visit (INDEPENDENT_AMBULATORY_CARE_PROVIDER_SITE_OTHER): Payer: Medicare Other | Admitting: Cardiovascular Disease

## 2020-03-21 ENCOUNTER — Encounter: Payer: Self-pay | Admitting: Cardiovascular Disease

## 2020-03-21 VITALS — BP 128/78 | HR 70 | Temp 97.6°F | Ht 74.0 in | Wt 291.0 lb

## 2020-03-21 DIAGNOSIS — I5032 Chronic diastolic (congestive) heart failure: Secondary | ICD-10-CM | POA: Diagnosis not present

## 2020-03-21 DIAGNOSIS — N183 Chronic kidney disease, stage 3 unspecified: Secondary | ICD-10-CM

## 2020-03-21 DIAGNOSIS — Z955 Presence of coronary angioplasty implant and graft: Secondary | ICD-10-CM | POA: Diagnosis not present

## 2020-03-21 DIAGNOSIS — E785 Hyperlipidemia, unspecified: Secondary | ICD-10-CM | POA: Diagnosis not present

## 2020-03-21 DIAGNOSIS — I25118 Atherosclerotic heart disease of native coronary artery with other forms of angina pectoris: Secondary | ICD-10-CM | POA: Diagnosis not present

## 2020-03-21 DIAGNOSIS — I10 Essential (primary) hypertension: Secondary | ICD-10-CM | POA: Diagnosis not present

## 2020-03-21 NOTE — Patient Instructions (Signed)
Medication Instructions:  Your physician recommends that you continue on your current medications as directed. Please refer to the Current Medication list given to you today.  *If you need a refill on your cardiac medications before your next appointment, please call your pharmacy*   Lab Work: None today If you have labs (blood work) drawn today and your tests are completely normal, you will receive your results only by: Marland Kitchen MyChart Message (if you have MyChart) OR . A paper copy in the mail If you have any lab test that is abnormal or we need to change your treatment, we will call you to review the results.   Testing/Procedures: Your physician has requested that you have an echocardiogram. Echocardiography is a painless test that uses sound waves to create images of your heart. It provides your doctor with information about the size and shape of your heart and how well your heart's chambers and valves are working. This procedure takes approximately one hour. There are no restrictions for this procedure.     Follow-Up: At [facility], you and your health needs are our priority.  As part of our continuing mission to provide you with exceptional heart care, we have created designated Provider Care Teams.  These Care Teams include your primary Cardiologist (physician) and Advanced Practice Providers (APPs -  Physician Assistants and Nurse Practitioners) who all work together to provide you with the care you need, when you need it.  We recommend signing up for the patient portal called "MyChart".  Sign up information is provided on this After Visit Summary.  MyChart is used to connect with patients for Virtual Visits (Telemedicine).  Patients are able to view lab/test results, encounter notes, upcoming appointments, etc.  Non-urgent messages can be sent to your provider as well.   To learn more about what you can do with MyChart, go to [email or web address].    Your next appointment:   6  month(s)  The format for your next appointment:   In Person  Provider:   You may sere one of the following Advanced Practice Providers on your designated Care Team:    Bernerd Pho, PA-C   Ermalinda Barrios, PA-C           Other Instructions None      Thank you for choosing [facility] !

## 2020-03-21 NOTE — Progress Notes (Signed)
SUBJECTIVE: [name] is a 74 y.o. male with a history of hypertension and hypertensive heart disease as well as coronary artery disease and sustained a NSTEMI on 11/16/2012 and received a drug-eluting stent to the LAD and D1, with residual diffuse nonobstructive disease in the RCA and LCx.  He also has CKD, sleep apnea, obesity, and chronic diastolic heart failure.  Most recent echocardiogram ion 07/06/15 demonstrated normal left ventricular systolic function, EF 38-25%, and grade 2 diastolic dysfunction.  The patient denies any symptoms of chest pain, palpitations, shortness of breath, lightheadedness, dizziness, leg swelling, orthopnea, PND, and syncope.  He exercises at the [facility] 4 days/week.  He tries to eat a healthy diet.  Review of Systems: As per "subjective", otherwise negative.  Allergies  Allergen Reactions  . Niacin And Related Other (See Comments)    Reaction: flushing and burning side effects even with extended release    Current Outpatient Medications  Medication Sig Dispense Refill  . acarbose (PRECOSE) 50 MG tablet Take 50 mg by mouth 3 (three) times daily with meals.     Marland Kitchen allopurinol (ZYLOPRIM) 100 MG tablet Take 1 tablet (100 mg total) by mouth daily. 30 tablet 2  . amLODipine (NORVASC) 10 MG tablet Take 10 mg by mouth daily.    Marland Kitchen aspirin EC 81 MG tablet Take 1 tablet (81 mg total) by mouth daily. 90 tablet 3  . calcitRIOL (ROCALTROL) 0.25 MCG capsule Take 1 capsule by mouth daily.    . cholecalciferol (VITAMIN D) 1000 units tablet Take 1,000 Units daily by mouth.    . docusate sodium (COLACE) 100 MG capsule Take 100 mg by mouth daily as needed for mild constipation.    . ferrous sulfate 325 (65 FE) MG tablet Take 1 tablet (325 mg total) by mouth 3 (three) times daily after meals.  3  . furosemide (LASIX) 40 MG tablet Take 1 tablet (40 mg total) by mouth 2 (two) times daily. 60 tablet 3  . gabapentin (NEURONTIN) 300 MG capsule Take 1 capsule by mouth  3 (three) times daily.     Marland Kitchen HYDROcodone-acetaminophen (NORCO) 10-325 MG tablet Take 1-2 tablets by mouth 4 (four) times daily as needed for moderate pain or severe pain. 10 tablet 0  . insulin NPH Human (NOVOLIN N) 100 UNIT/ML injection Inject 0.2 mLs (20 Units total) into the skin every morning. Ands syringes 1/day 10 mL 11  . lactulose (CHRONULAC) 10 GM/15ML solution Take 10 g by mouth 3 (three) times daily as needed for mild constipation.    . lidocaine (LIDODERM) 5 % Place 1 patch onto the skin daily. Remove & Discard patch within 12 hours or as directed by MD 30 patch 0  . nitroGLYCERIN (NITROSTAT) 0.4 MG SL tablet PLACE ONE TABLET UNDER THE TONGUE EVERY 5 MINUTES FOR 3 DOSES AS NEEDED (Patient taking differently: Place 0.4 mg under the tongue every 5 (five) minutes as needed. ) 25 tablet 3  . omeprazole (PRILOSEC) 20 MG capsule Take 20 mg by mouth daily.    . pravastatin (PRAVACHOL) 20 MG tablet Take 1 tablet (20 mg total) by mouth every evening. 90 tablet 3   No current facility-administered medications for this visit.    Past Medical History:  Diagnosis Date  . Anxiety   . Benign prostatic hypertrophy    Nocturia  . CAD (coronary artery disease)    a.  NSTEMI 10/2012 s/p DES to LAD & DES to 1st diagonal with residual diffuse nonobstructive  dz in LCx/RCA   . Chronic kidney disease, stage 3, mod decreased GFR    Creatinine of 1.5 in 03/2009  . Degenerative joint disease   . Depression    hx of  . Diabetes mellitus, type II ([place]) 12/10/2012.  . Diastolic heart failure    Pedal edema, "a long time ago"  . Erectile dysfunction   . Flu 2013   hx of  . GERD (gastroesophageal reflux disease)   . Hemorrhoids   . High cholesterol   . Hypertension    Exercise induced  . NSVT (nonsustained ventricular tachycardia) (HCC)    Exercise induced  . Obesity   . 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 Tubular adenoma 2014    Past Surgical History:  Procedure  Laterality Date  . BACK SURGERY  2007   x2  . CARDIAC CATHETERIZATION     with stent placement  . COLONOSCOPY WITH ESOPHAGOGASTRODUODENOSCOPY (EGD) N/A 11/23/2013   Dr. Oneida Alar- TCS= left sided colitis and mild proctitis, moderate internal hemorrhoids, small nodule in the rectum, bx=tubular adenoma, EGD=-mild non-erosive gastritis  . HERNIA REPAIR    . LEFT HEART CATHETERIZATION WITH CORONARY ANGIOGRAM N/A 11/16/2012   Procedure: LEFT HEART CATHETERIZATION WITH CORONARY ANGIOGRAM;  Surgeon: Sherren Mocha, MD;  Location: [facility] CATH LAB;  Service: Cardiovascular;  Laterality: N/A;  . PERCUTANEOUS CORONARY STENT INTERVENTION (PCI-S)  11/16/2012   Procedure: PERCUTANEOUS CORONARY STENT INTERVENTION (PCI-S);  Surgeon: Sherren Mocha, MD;  Location: [facility] CATH LAB;  Service: Cardiovascular;;  . TOTAL KNEE ARTHROPLASTY Right 11/15/2013   Procedure: RIGHT TOTAL KNEE ARTHROPLASTY;  Surgeon: Mauri Pole, MD;  Location: [facility] ORS;  Service: Orthopedics;  Laterality: Right;  . TOTAL KNEE ARTHROPLASTY Left 03/07/2015   Procedure: LEFT TOTAL KNEE ARTHROPLASTY;  Surgeon: Paralee Cancel, MD;  Location: [facility] ORS;  Service: Orthopedics;  Laterality: Left;    Social History   Socioeconomic History  . Marital status: Widowed    Spouse name: Not on file  . Number of children: Not on file  . Years of education: Not on file  . Highest education level: Not on file  Occupational History  . Occupation: Disabled    Employer: RETIRED  Tobacco Use  . Smoking status: Former Smoker    Packs/day: 1.00    Years: 30.00    Pack years: 30.00    Types: Cigarettes    Start date: 01/22/1965    Quit date: 12/23/1992    Years since quitting: 27.2  . Smokeless tobacco: Never Used  Substance and Sexual Activity  . Alcohol use: No    Alcohol/week: 0.0 standard drinks  . Drug use: No  . Sexual activity: Not Currently  Other Topics Concern  . Not on file  Social History Narrative   Married   Right handed   One story home no  caffeine   Social Determinants of Health   Financial Resource Strain:   . Difficulty of Paying Living Expenses:   Food Insecurity:   . Worried About Charity fundraiser in the Last Year:   . Arboriculturist in the Last Year:   Transportation Needs:   . Film/video editor (Medical):   Marland Kitchen Lack of Transportation (Non-Medical):   Physical Activity:   . Days of Exercise per Week:   . Minutes of Exercise per Session:   Stress:   . Feeling of Stress :   Social Connections:   . Frequency of Communication with Friends and  Family:   . Frequency of Social Gatherings with Friends and Family:   . Attends Religious Services:   . Active Member of Clubs or Organizations:   . Attends Archivist Meetings:   Marland Kitchen Marital Status:   Intimate Partner Violence:   . Fear of Current or Ex-Partner:   . Emotionally Abused:   Marland Kitchen Physically Abused:   . Sexually Abused:      Vitals:   03/21/20 0822  BP: 128/78  Pulse: 70  Temp: 97.6 F (36.4 C)  SpO2: 96%  Height: 6' 2"  (1.88 m)    Wt Readings from Last 3 Encounters:  02/28/20 291 lb 9.6 oz (132.3 kg)  01/18/20 295 lb (133.8 kg)  12/29/19 290 lb 12.8 oz (131.9 kg)     PHYSICAL EXAM General: NAD HEENT: Normal. Neck: No JVD, no thyromegaly. Lungs: Clear to auscultation bilaterally with normal respiratory effort. CV: Regular rate and rhythm, normal S1/S2, no S3/S4, no murmur.  Chronic, trivial bilateral lower extremity edema.  No carotid bruit.   Abdomen: Soft, nontender, no distention.  Neurologic: Alert and oriented.  Psych: Normal affect. Skin: Normal. Musculoskeletal: No gross deformities.      Labs: Lab Results  Component Value Date/Time   K 4.1 10/28/2019 06:22 AM   BUN 64 (H) 10/28/2019 06:22 AM   CREATININE 2.02 (H) 10/28/2019 06:22 AM   CREATININE 2.49 (H) 09/18/2015 01:59 PM   ALT 13 10/28/2019 06:22 AM   TSH 0.512 10/25/2019 02:05 PM   TSH 0.407 01/07/2013 03:45 PM   HGB 9.5 (L) 10/28/2019 06:22 AM      Lipids: Lab Results  Component Value Date/Time   LDLCALC 74 11/15/2012 06:25 AM   CHOL 121 11/15/2012 06:25 AM   TRIG 57 11/15/2012 06:25 AM   HDL 36 (L) 11/15/2012 06:25 AM       ASSESSMENT AND PLAN:  1.Chronic diastolic heart failure:Euvolemic on Lasix 40 mg bid.  I will obtain a follow-up echocardiogram to assess for interval changes in cardiac structure and function.    2. CAD s/p PCI of LAD and D1: Stable ischemic heart disease. Continue taking aspirin 81 mg and pravastatin 20 mg daily.Metoprololpreviously stopped due to bradycardia.  3. Essential HTN:BP is normal. No changes.  4. Hyperlipidemia: Continue pravastatin 20 mg.  LDL 78 on 08/24/2019.  5.  Chronic kidney disease stage III: Creatinine 2.02 on 10/28/2019.   Disposition: Follow up 6 months   Kate Sable, M.D., F.A.C.C.

## 2020-03-28 ENCOUNTER — Other Ambulatory Visit: Payer: Self-pay

## 2020-03-28 ENCOUNTER — Ambulatory Visit (HOSPITAL_COMMUNITY)
Admission: RE | Admit: 2020-03-28 | Discharge: 2020-03-28 | Disposition: A | Discharge status: Home / Self Care | Payer: Medicare Other | Source: Ambulatory Visit | Attending: Cardiovascular Disease | Admitting: Cardiovascular Disease

## 2020-03-28 DIAGNOSIS — I5032 Chronic diastolic (congestive) heart failure: Secondary | ICD-10-CM | POA: Diagnosis not present

## 2020-03-28 NOTE — Progress Notes (Signed)
*  PRELIMINARY RESULTS* Echocardiogram 2D Echocardiogram has been performed.  [name] 03/28/2020, 3:08 PM

## 2020-04-04 DIAGNOSIS — H579 Unspecified disorder of eye and adnexa: Secondary | ICD-10-CM | POA: Diagnosis not present

## 2020-04-04 DIAGNOSIS — G894 Chronic pain syndrome: Secondary | ICD-10-CM | POA: Diagnosis not present

## 2020-04-11 ENCOUNTER — Emergency Department (HOSPITAL_COMMUNITY)
Admission: EM | Admit: 2020-04-11 | Discharge: 2020-04-11 | Disposition: A | Discharge status: Home / Self Care | Payer: Medicare Other | Attending: Emergency Medicine | Admitting: Emergency Medicine

## 2020-04-11 ENCOUNTER — Other Ambulatory Visit: Payer: Self-pay

## 2020-04-11 ENCOUNTER — Encounter (HOSPITAL_COMMUNITY): Payer: Self-pay

## 2020-04-11 ENCOUNTER — Other Ambulatory Visit: Payer: Medicare Other

## 2020-04-11 DIAGNOSIS — Z7982 Long term (current) use of aspirin: Secondary | ICD-10-CM | POA: Insufficient documentation

## 2020-04-11 DIAGNOSIS — M79642 Pain in left hand: Secondary | ICD-10-CM

## 2020-04-11 DIAGNOSIS — M109 Gout, unspecified: Secondary | ICD-10-CM

## 2020-04-11 DIAGNOSIS — I130 Hypertensive heart and chronic kidney disease with heart failure and stage 1 through stage 4 chronic kidney disease, or unspecified chronic kidney disease: Secondary | ICD-10-CM | POA: Insufficient documentation

## 2020-04-11 DIAGNOSIS — I5032 Chronic diastolic (congestive) heart failure: Secondary | ICD-10-CM | POA: Insufficient documentation

## 2020-04-11 DIAGNOSIS — N183 Chronic kidney disease, stage 3 unspecified: Secondary | ICD-10-CM | POA: Insufficient documentation

## 2020-04-11 DIAGNOSIS — E1122 Type 2 diabetes mellitus with diabetic chronic kidney disease: Secondary | ICD-10-CM | POA: Insufficient documentation

## 2020-04-11 DIAGNOSIS — Z79899 Other long term (current) drug therapy: Secondary | ICD-10-CM | POA: Insufficient documentation

## 2020-04-11 DIAGNOSIS — Z96652 Presence of left artificial knee joint: Secondary | ICD-10-CM | POA: Diagnosis not present

## 2020-04-11 DIAGNOSIS — Z96651 Presence of right artificial knee joint: Secondary | ICD-10-CM | POA: Insufficient documentation

## 2020-04-11 DIAGNOSIS — Z794 Long term (current) use of insulin: Secondary | ICD-10-CM | POA: Diagnosis not present

## 2020-04-11 MED ORDER — IBUPROFEN 400 MG PO TABS
400.0000 mg | ORAL_TABLET | Freq: Once | ORAL | Status: AC
  Administered 2020-04-11: 400 mg via ORAL
  Filled 2020-04-11: qty 1

## 2020-04-11 MED ORDER — COLCHICINE 0.6 MG PO TABS
0.3000 mg | ORAL_TABLET | Freq: Once | ORAL | Status: AC
  Administered 2020-04-11: 12:00:00 0.3 mg via ORAL
  Filled 2020-04-11: qty 1
  Filled 2020-04-11: qty 0.5

## 2020-04-11 MED ORDER — HYDROMORPHONE HCL 1 MG/ML IJ SOLN
1.0000 mg | Freq: Once | INTRAMUSCULAR | Status: AC
  Administered 2020-04-11: 1 mg via INTRAMUSCULAR
  Filled 2020-04-11: qty 1

## 2020-04-11 MED ORDER — PREDNISONE 20 MG PO TABS
40.0000 mg | ORAL_TABLET | Freq: Once | ORAL | Status: AC
  Administered 2020-04-11: 40 mg via ORAL
  Filled 2020-04-11: qty 2

## 2020-04-11 MED ORDER — PREDNISONE 20 MG PO TABS: 40.0000 mg | ORAL_TABLET | Freq: Every day | ORAL | 0 refills | Status: DC

## 2020-04-11 NOTE — ED Provider Notes (Signed)
[name] EMERGENCY DEPARTMENT Provider Note   CSN: [id number] Arrival date & time: 04/11/20  1001     History Chief Complaint  Patient presents with  . Hand Pain    [name] is a 74 y.o. male.  HPI   73yM with L hand/wrist pain. Onset a few days ago. Denies trauma or strain. Pain constant. Worse with movement. No fever or chills. Not helped with home meds.   Past Medical History:  Diagnosis Date  . Anxiety   . Benign prostatic hypertrophy    Nocturia  . CAD (coronary artery disease)    a.  NSTEMI 10/2012 s/p DES to LAD & DES to 1st diagonal with residual diffuse nonobstructive dz in LCx/RCA   . Chronic kidney disease, stage 3, mod decreased GFR    Creatinine of 1.5 in 03/2009  . Degenerative joint disease   . Depression    hx of  . Diabetes mellitus, type II ([place]) 12/10/2012.  . Diastolic heart failure    Pedal edema, "a long time ago"  . Erectile dysfunction   . Flu 2013   hx of  . GERD (gastroesophageal reflux disease)   . Hemorrhoids   . High cholesterol   . Hypertension    Exercise induced  . NSVT (nonsustained ventricular tachycardia) (HCC)    Exercise induced  . Obesity   . 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 Tubular adenoma 2014    Patient Active Problem List   Diagnosis Date Noted  . Acute gouty arthritis 10/25/2019  . Sepsis ([place]) 04/02/2019  . Leukocytosis 04/02/2019  . Cellulitis of right hand 04/01/2019  . Peripheral edema   . CHF (congestive heart failure) ([place]) 07/05/2015  . Acute congestive heart failure ([place]) 07/04/2015  . Acute on chronic diastolic congestive heart failure ([place]) 07/04/2015  . HLD (hyperlipidemia) 07/04/2015  . Anxiety state 07/04/2015  . GERD (gastroesophageal reflux disease) 07/04/2015  . Heart block 07/04/2015  . Edema 03/11/2015  . Diastolic CHF, chronic ([place]) 03/11/2015  . 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 Muscle weakness (generalized) 01/13/2014  . Knee stiffness 01/13/2014  . Gout  12/11/2013  . Cellulitis 11/19/2013  . Expected blood loss anemia 11/17/2013  . Obese 11/17/2013  . S/P right TKA 11/15/2013  . Anemia, normocytic normochromic 01/12/2013  . Diabetes mellitus type 2 in obese ([place]) 12/10/2012  . CAD S/P percutaneous coronary angioplasty 11/24/2012  . Overweight(278.02) 11/17/2012  . Obstructive sleep apnea 11/15/2012  . Chronic kidney disease, stage 3, cardiorenal syndrome 08/23/2011  . Essential hypertension     Past Surgical History:  Procedure Laterality Date  . BACK SURGERY  2007   x2  . CARDIAC CATHETERIZATION     with stent placement  . COLONOSCOPY WITH ESOPHAGOGASTRODUODENOSCOPY (EGD) N/A 11/23/2013   Dr. Oneida Alar- TCS= left sided colitis and mild proctitis, moderate internal hemorrhoids, small nodule in the rectum, bx=tubular adenoma, EGD=-mild non-erosive gastritis  . HERNIA REPAIR    . LEFT HEART CATHETERIZATION WITH CORONARY ANGIOGRAM N/A 11/16/2012   Procedure: LEFT HEART CATHETERIZATION WITH CORONARY ANGIOGRAM;  Surgeon: Sherren Mocha, MD;  Location: [facility] CATH LAB;  Service: Cardiovascular;  Laterality: N/A;  . PERCUTANEOUS CORONARY STENT INTERVENTION (PCI-S)  11/16/2012   Procedure: PERCUTANEOUS CORONARY STENT INTERVENTION (PCI-S);  Surgeon: Sherren Mocha, MD;  Location: [facility] CATH LAB;  Service: Cardiovascular;;  . TOTAL KNEE ARTHROPLASTY Right 11/15/2013   Procedure: RIGHT TOTAL KNEE ARTHROPLASTY;  Surgeon: Mauri Pole, MD;  Location: [facility] ORS;  Service: Orthopedics;  Laterality: Right;  . TOTAL KNEE ARTHROPLASTY Left 03/07/2015   Procedure: LEFT TOTAL KNEE ARTHROPLASTY;  Surgeon: Paralee Cancel, MD;  Location: [facility] ORS;  Service: Orthopedics;  Laterality: Left;       Family History  Problem Relation Age of Onset  . Hypertension Other   . Cancer Other   . Heart disease Other   . Diabetes Other   . Hypertension Mother   . Cancer Father   . CAD Brother   . Colon cancer Neg Hx     Social History   Tobacco Use  . Smoking status:  Former Smoker    Packs/day: 1.00    Years: 30.00    Pack years: 30.00    Types: Cigarettes    Start date: 01/22/1965    Quit date: 12/23/1992    Years since quitting: 27.3  . Smokeless tobacco: Never Used  Substance Use Topics  . Alcohol use: No    Alcohol/week: 0.0 standard drinks  . Drug use: No    Home Medications Prior to Admission medications   Medication Sig Start Date End Date Taking? Authorizing Provider  acarbose (PRECOSE) 50 MG tablet Take 50 mg by mouth 3 (three) times daily with meals.  10/20/17   [provider]  allopurinol (ZYLOPRIM) 100 MG tablet Take 1 tablet (100 mg total) by mouth daily. 10/29/19 03/21/20  Manuella Ghazi, Pratik D, DO  amLODipine (NORVASC) 10 MG tablet Take 10 mg by mouth daily.    [provider]  aspirin EC 81 MG tablet Take 1 tablet (81 mg total) by mouth daily. 09/14/19   Herminio Commons, MD  calcitRIOL (ROCALTROL) 0.25 MCG capsule Take 1 capsule by mouth daily. 12/13/16   [provider]  cholecalciferol (VITAMIN D) 1000 units tablet Take 1,000 Units daily by mouth.    [provider]  docusate sodium (COLACE) 100 MG capsule Take 100 mg by mouth daily as needed for mild constipation.    [provider]  ferrous sulfate 325 (65 FE) MG tablet Take 1 tablet (325 mg total) by mouth 3 (three) times daily after meals. 03/09/15   Danae Orleans, PA-C  furosemide (LASIX) 40 MG tablet Take 1 tablet (40 mg total) by mouth 2 (two) times daily. 08/09/19   Herminio Commons, MD  gabapentin (NEURONTIN) 300 MG capsule Take 1 capsule by mouth 3 (three) times daily.  10/19/19   [provider]  HYDROcodone-acetaminophen (NORCO) 10-325 MG tablet Take 1-2 tablets by mouth 4 (four) times daily as needed for moderate pain or severe pain. 10/28/19   Manuella Ghazi, Pratik D, DO  insulin NPH Human (NOVOLIN N) 100 UNIT/ML injection Inject 0.2 mLs (20 Units total) into the skin every morning. Ands syringes 1/day 12/29/19   Renato Shin, MD   lactulose (CHRONULAC) 10 GM/15ML solution Take 10 g by mouth 3 (three) times daily as needed for mild constipation.    [provider]  lidocaine (LIDODERM) 5 % Place 1 patch onto the skin daily. Remove & Discard patch within 12 hours or as directed by MD 04/07/19   Manuella Ghazi, Pratik D, DO  nitroGLYCERIN (NITROSTAT) 0.4 MG SL tablet PLACE ONE TABLET UNDER THE TONGUE EVERY 5 MINUTES FOR 3 DOSES AS NEEDED Patient taking differently: Place 0.4 mg under the tongue every 5 (five) minutes as needed.  07/22/17   Herminio Commons, MD  omeprazole (PRILOSEC) 20 MG capsule Take 20 mg by mouth daily. 10/22/19   [provider]  pravastatin (PRAVACHOL) 20 MG  tablet Take 1 tablet (20 mg total) by mouth every evening. 09/14/19 01/18/20  Herminio Commons, MD    Allergies    Niacin and related  Review of Systems   Review of Systems All systems reviewed and negative, other than as noted in HPI.  Physical Exam Updated Vital Signs BP (!) 155/58   Pulse 60   Temp 97.8 F (36.6 C) (Oral)   Resp 18   Ht 6' 2"  (1.88 m)   Wt 131 kg   SpO2 97%   BMI 37.08 kg/m   Physical Exam Vitals and nursing note reviewed.  Constitutional:      General: [name] is not in acute distress.    Appearance: [name] is well-developed.  HENT:     Head: Normocephalic and atraumatic.  Eyes:     General:        Right eye: No discharge.        Left eye: No discharge.     Conjunctiva/sclera: Conjunctivae normal.  Cardiovascular:     Rate and Rhythm: Normal rate and regular rhythm.     Heart sounds: Normal heart sounds. No murmur. No friction rub. No gallop.   Pulmonary:     Effort: Pulmonary effort is normal. No respiratory distress.     Breath sounds: Normal breath sounds.  Abdominal:     General: There is no distension.     Palpations: Abdomen is soft.     Tenderness: There is no abdominal tenderness.  Musculoskeletal:     Cervical back: Neck supple.     Comments: L thumb surgical absent. Tophi bilateral  hands. Mild swelling, TTP and pain with ROM of L wrist. No redness. Doesn't feel appreciably warm.   Skin:    General: Skin is warm and dry.  Neurological:     Mental Status: [name] is alert.  Psychiatric:        Behavior: Behavior normal.        Thought Content: Thought content normal.     ED Results / Procedures / Treatments   Labs (all labs ordered are listed, but only abnormal results are displayed) Labs Reviewed - No data to display  EKG None  Radiology No results found.  Procedures Procedures (including critical care time)  Medications Ordered in ED Medications - No data to display  ED Course  I have reviewed the triage vital signs and the nursing notes.  Pertinent labs & imaging results that were available during my care of the patient were reviewed by me and considered in my medical decision making (see chart for details).    MDM Rules/Calculators/A&P                      73yM with atraumatic L hand/wrist pain. Likely gout. Will treat as such. PCP FU. Baseline renal impairment noted. Already prescribed narcotic.  Final Clinical Impression(s) / ED Diagnoses Final diagnoses:  Left hand pain  Gout of left hand, unspecified cause, unspecified chronicity    Rx / DC Orders ED Discharge Orders    None       Virgel Manifold, MD 04/14/20 1040

## 2020-04-11 NOTE — ED Triage Notes (Signed)
Pt reports has history of cellulitis in left hand.  Reports pain in left hand and left arm since last night.  Pt says took his pain pill around 0830 this morning.

## 2020-04-14 DIAGNOSIS — M794 Hypertrophy of (infrapatellar) fat pad: Secondary | ICD-10-CM | POA: Diagnosis not present

## 2020-04-14 DIAGNOSIS — R42 Dizziness and giddiness: Secondary | ICD-10-CM | POA: Diagnosis not present

## 2020-04-14 DIAGNOSIS — H47233 Glaucomatous optic atrophy, bilateral: Secondary | ICD-10-CM | POA: Diagnosis not present

## 2020-04-14 DIAGNOSIS — H2513 Age-related nuclear cataract, bilateral: Secondary | ICD-10-CM | POA: Diagnosis not present

## 2020-04-17 DIAGNOSIS — R42 Dizziness and giddiness: Secondary | ICD-10-CM | POA: Diagnosis not present

## 2020-04-17 DIAGNOSIS — R197 Diarrhea, unspecified: Secondary | ICD-10-CM | POA: Diagnosis not present

## 2020-04-17 DIAGNOSIS — R110 Nausea: Secondary | ICD-10-CM | POA: Diagnosis not present

## 2020-04-19 ENCOUNTER — Encounter: Payer: Self-pay | Admitting: Internal Medicine

## 2020-04-19 DIAGNOSIS — N189 Chronic kidney disease, unspecified: Secondary | ICD-10-CM | POA: Diagnosis not present

## 2020-04-19 DIAGNOSIS — R809 Proteinuria, unspecified: Secondary | ICD-10-CM | POA: Diagnosis not present

## 2020-04-19 DIAGNOSIS — E211 Secondary hyperparathyroidism, not elsewhere classified: Secondary | ICD-10-CM | POA: Diagnosis not present

## 2020-04-19 DIAGNOSIS — I5032 Chronic diastolic (congestive) heart failure: Secondary | ICD-10-CM | POA: Diagnosis not present

## 2020-04-21 DIAGNOSIS — M1991 Primary osteoarthritis, unspecified site: Secondary | ICD-10-CM | POA: Diagnosis not present

## 2020-04-28 ENCOUNTER — Encounter (HOSPITAL_COMMUNITY): Payer: Self-pay | Admitting: Physical Therapy

## 2020-04-28 ENCOUNTER — Ambulatory Visit (HOSPITAL_COMMUNITY): Payer: Medicare Other | Attending: Physician Assistant | Admitting: Physical Therapy

## 2020-04-28 ENCOUNTER — Other Ambulatory Visit: Payer: Self-pay

## 2020-04-28 DIAGNOSIS — R42 Dizziness and giddiness: Secondary | ICD-10-CM | POA: Diagnosis not present

## 2020-04-28 NOTE — Therapy (Signed)
[place], [place], [address] Phone: [phone]   Fax:  [phone]  Physical Therapy Evaluation  Patient Details  Name: [name] MRN: [id number] Date of Birth: 03/31/1946 No data recorded  Encounter Date: 04/28/2020  PT End of Session - 04/28/20 1715    Visit Number  1    Number of Visits  1    PT Start Time  0263    PT Stop Time  7858    PT Time Calculation (min)  45 min       Past Medical History:  Diagnosis Date  . Anxiety   . Benign prostatic hypertrophy    Nocturia  . CAD (coronary artery disease)    a.  NSTEMI 10/2012 s/p DES to LAD & DES to 1st diagonal with residual diffuse nonobstructive dz in LCx/RCA   . Chronic kidney disease, stage 3, mod decreased GFR    Creatinine of 1.5 in 03/2009  . Degenerative joint disease   . Depression    hx of  . Diabetes mellitus, type II ([place]) 12/10/2012.  . Diastolic heart failure    Pedal edema, "a long time ago"  . Erectile dysfunction   . Flu 2013   hx of  . GERD (gastroesophageal reflux disease)   . Hemorrhoids   . High cholesterol   . Hypertension    Exercise induced  . NSVT (nonsustained ventricular tachycardia) (HCC)    Exercise induced  . Obesity   . 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 Tubular adenoma 2014    Past Surgical History:  Procedure Laterality Date  . BACK SURGERY  2007   x2  . CARDIAC CATHETERIZATION     with stent placement  . COLONOSCOPY WITH ESOPHAGOGASTRODUODENOSCOPY (EGD) N/A 11/23/2013   [name]- TCS= left sided colitis and mild proctitis, moderate internal hemorrhoids, small nodule in the rectum, bx=tubular adenoma, EGD=-mild non-erosive gastritis  . HERNIA REPAIR    . LEFT HEART CATHETERIZATION WITH CORONARY ANGIOGRAM N/A 11/16/2012   Procedure: LEFT HEART CATHETERIZATION WITH CORONARY ANGIOGRAM;  Surgeon: [name] Mocha, MD;  Location: [facility] CATH LAB;  Service: Cardiovascular;  Laterality:  N/A;  . PERCUTANEOUS CORONARY STENT INTERVENTION (PCI-S)  11/16/2012   Procedure: PERCUTANEOUS CORONARY STENT INTERVENTION (PCI-S);  Surgeon: [name] Mocha, MD;  Location: [facility] CATH LAB;  Service: Cardiovascular;;  . TOTAL KNEE ARTHROPLASTY Right 11/15/2013   Procedure: RIGHT TOTAL KNEE ARTHROPLASTY;  Surgeon: [name] Pole, MD;  Location: [facility] ORS;  Service: Orthopedics;  Laterality: Right;  . TOTAL KNEE ARTHROPLASTY Left 03/07/2015   Procedure: LEFT TOTAL KNEE ARTHROPLASTY;  Surgeon: [name] Cancel, MD;  Location: [facility] ORS;  Service: Orthopedics;  Laterality: Left;    There were no vitals filed for this visit.   Subjective Assessment - 04/28/20 1455    Subjective  [name] states that he has been having Vertigo for about a year.  It was getting better when he took the COVID shot which seemed to exacerbate his symptoms.  He has not problem laying down or turning his head but he has increased dizziness when he stands up.  He has been to [name] who states that there really isn't anything to be done but his primary MD sent him to attempt vestibular therapy.    Pertinent History  Rt TKR, Lt TKR, back and cervical pain, DM, HTM, lumbar fusion    Currently in Pain?  Yes   back and knees  but we are not addressing this.              Vestibular Assessment - 04/28/20 0001      Symptom Behavior   Type of Dizziness   Imbalance   room use to spin but now it is just imbalance.   Frequency of Dizziness  not sure    Duration of Dizziness  not sure     Aggravating Factors  Sit to stand    Relieving Factors  No known relieving factors    Progression of Symptoms  Better      Oculomotor Exam   Smooth Pursuits  Saccades    Saccades  Intact      Positional Testing   Dix-Hallpike  Dix-Hallpike Right;Dix-Hallpike Left      Dix-Hallpike Right   Dix-Hallpike Right Symptoms  No nystagmus      Dix-Hallpike Left   Dix-Hallpike Left Symptoms  No nystagmus      Positional Sensitivities   Up from  Right Hallpike  Mild dizziness    Up from Left Hallpike  Mild dizziness    Rolling Right  Mild dizziness    Rolling Left  Moderate dizziness          Objective measurements completed on examination: See above findings.       Vestibular Treatment/Exercise - 04/28/20 0001      Vestibular Treatment/Exercise   Habituation Exercises  Brandt Daroff;Comment   rolling LT; nose to knee x 5 each      Nestor Lewandowsky   Number of Reps   5    Symptom Description   better             PT Education - 04/28/20 1714    Education Details  HEP    Person(s) Educated  Patient    Methods  Explanation;Verbal cues;Tactile cues;Handout    Comprehension  Verbalized understanding;Returned demonstration       PT Short Term Goals - 04/28/20 1719      PT SHORT TERM GOAL #1   Title  Pt to be completing habituation exercises to decrease sx of dizziness                [name] - 04/28/20 1715    Clinical Impression Statement  PT evaluated for vertigo with negative Dix [place].  Therapist explained about habiutation exercises. PT states that he has all the papers and has been through this before with [name] office.  PT states that [name] stated he would just have to wait it out.  Pt did not want to come back for further visits states that he will complete the exercises at home on his own.    Personal Factors and Comorbidities  Age    Examination-Activity Limitations  Locomotion Level    Examination-Participation Restrictions  Driving;Shop;Cleaning;Meal Prep;Medication Management    Stability/Clinical Decision Making  Stable/Uncomplicated    Clinical Decision Making  Low    Rehab Potential  Good    PT Duration  --   1 week   PT Treatment/Interventions  Patient/family education;Therapeutic exercise    PT Next Visit [name]  Dischage to HEP    PT Home Exercise [name]  supine to LT sidelying, sit to sidelying and sitting nose to knee       Patient will benefit from skilled therapeutic  intervention in order to improve the following deficits and impairments:  Difficulty walking, Dizziness  Visit Diagnosis: Dizziness and giddiness     Problem List Patient Active Problem List  Diagnosis Date Noted  . Acute gouty arthritis 10/25/2019  . Sepsis ([place]) 04/02/2019  . Leukocytosis 04/02/2019  . Cellulitis of right hand 04/01/2019  . Peripheral edema   . CHF (congestive heart failure) ([place]) 07/05/2015  . Acute congestive heart failure ([place]) 07/04/2015  . Acute on chronic diastolic congestive heart failure ([place]) 07/04/2015  . HLD (hyperlipidemia) 07/04/2015  . Anxiety state 07/04/2015  . GERD (gastroesophageal reflux disease) 07/04/2015  . Heart block 07/04/2015  . Edema 03/11/2015  . Diastolic CHF, chronic ([place]) 03/11/2015  . 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 Muscle weakness (generalized) 01/13/2014  . Knee stiffness 01/13/2014  . Gout 12/11/2013  . Cellulitis 11/19/2013  . Expected blood loss anemia 11/17/2013  . Obese 11/17/2013  . S/P right TKA 11/15/2013  . Anemia, normocytic normochromic 01/12/2013  . Diabetes mellitus type 2 in obese ([place]) 12/10/2012  . CAD S/P percutaneous coronary angioplasty 11/24/2012  . Overweight(278.02) 11/17/2012  . Obstructive sleep apnea 11/15/2012  . Chronic kidney disease, stage 3, cardiorenal syndrome 08/23/2011  . Essential hypertension     Rayetta Humphrey, [place] CLT [phone] 04/28/2020, 5:20 PM  [place] [address] [place], [place], [address] Phone: [phone]   Fax:  [phone]  Name: [name] MRN: [id number] Date of Birth: March 04, 1946

## 2020-05-04 DIAGNOSIS — G894 Chronic pain syndrome: Secondary | ICD-10-CM | POA: Diagnosis not present

## 2020-05-05 ENCOUNTER — Other Ambulatory Visit: Payer: Self-pay

## 2020-05-05 ENCOUNTER — Encounter: Payer: Self-pay | Admitting: Gastroenterology

## 2020-05-05 ENCOUNTER — Ambulatory Visit: Payer: Medicare Other | Admitting: Gastroenterology

## 2020-05-05 DIAGNOSIS — D509 Iron deficiency anemia, unspecified: Secondary | ICD-10-CM

## 2020-05-05 DIAGNOSIS — Z86010 Personal history of colonic polyps: Secondary | ICD-10-CM | POA: Diagnosis not present

## 2020-05-05 NOTE — Progress Notes (Signed)
Primary Care Physician:  Redmond School, MD  Primary Gastroenterologist:  Garfield Cornea, MD   Chief Complaint  Patient presents with  . Anemia    dark stool    HPI:  [name] is a 74 y.o. male here at the request of Dr. Theador Hawthorne for further evaluation of iron deficiency anemia.  Patient has a history of chronic kidney disease stage 4, hypertension, hyperlipidemia, CHF, gout.  Labs from March 08, 2020: Creatinine 2.20, BUN 31, albumin 4.2, white blood cell count 8300, hemoglobin 13.8, hematocrit 39.9, platelets 199,000.  Labs from December 2020, ferritin 1058, iron saturation is 11%.  Patient had EGD and colonoscopy in December 2014 for iron deficiency anemia.  Noted to have left-sided colitis, mild proctitis biopsies suspicious for ischemic colitis.  Moderate internal hemorrhoids, small nodule in the rectum which was a tubular adenoma.  EGD showed mild nonerosive gastritis.  Advised to have 5 year surveillance colonoscopy for h/o tubular adenoma.   Had both covid vaccines. Vertigo started back after second shot. No melena, brbpr. No constipation. Stomach bug two weeks ago. BMs have firmed back, near normal.Occasionally with use a OTC laxative or drink two cups of hot coffee if having any issues with constipation. No abd pain. No heartburn, vomiting, dysphagia. Takes norco 2-3 times per day. Ibuprofen once per day.  Aspirin 81 mg daily.   Current Outpatient Medications  Medication Sig Dispense Refill  . acarbose (PRECOSE) 50 MG tablet Take 50 mg by mouth 3 (three) times daily with meals.     Marland Kitchen acetaminophen (TYLENOL) 650 MG CR tablet Take 1,300 mg by mouth daily.    Marland Kitchen allopurinol (ZYLOPRIM) 100 MG tablet Take 1 tablet (100 mg total) by mouth daily. 30 tablet 2  . amLODipine (NORVASC) 10 MG tablet Take 10 mg by mouth daily.    Marland Kitchen aspirin EC 81 MG tablet Take 1 tablet (81 mg total) by mouth daily. 90 tablet 3  . calcitRIOL (ROCALTROL) 0.25 MCG capsule Take 1 capsule by mouth daily.     . cholecalciferol (VITAMIN D) 1000 units tablet Take 1,000 Units daily by mouth.    . cloNIDine (CATAPRES) 0.1 MG tablet Take 0.1 mg by mouth 2 (two) times daily.    Marland Kitchen docusate sodium (COLACE) 100 MG capsule Take 100 mg by mouth daily as needed for mild constipation.    . furosemide (LASIX) 40 MG tablet Take 1 tablet (40 mg total) by mouth 2 (two) times daily. 60 tablet 3  . gabapentin (NEURONTIN) 300 MG capsule Take 1 capsule by mouth 3 (three) times daily.     Marland Kitchen HYDROcodone-acetaminophen (NORCO) 10-325 MG tablet Take 1-2 tablets by mouth 4 (four) times daily as needed for moderate pain or severe pain. 10 tablet 0  . insulin NPH Human (NOVOLIN N) 100 UNIT/ML injection Inject 0.2 mLs (20 Units total) into the skin every morning. Ands syringes 1/day 10 mL 11  . lactulose (CHRONULAC) 10 GM/15ML solution Take 10 g by mouth 3 (three) times daily as needed for mild constipation.    . nitroGLYCERIN (NITROSTAT) 0.4 MG SL tablet PLACE ONE TABLET UNDER THE TONGUE EVERY 5 MINUTES FOR 3 DOSES AS NEEDED (Patient taking differently: Place 0.4 mg under the tongue every 5 (five) minutes as needed. ) 25 tablet 3  . omeprazole (PRILOSEC) 20 MG capsule Take 20 mg by mouth as needed.     . pravastatin (PRAVACHOL) 20 MG tablet Take 1 tablet (20 mg total) by mouth every evening. 90 tablet 3  .  predniSONE (DELTASONE) 20 MG tablet Take 2 tablets (40 mg total) by mouth daily. (Patient taking differently: Take 40 mg by mouth as needed. ) 10 tablet 0   No current facility-administered medications for this visit.    Allergies as of 05/05/2020 - Review Complete 05/05/2020  Allergen Reaction Noted  . Niacin and related Other (See Comments) 10/24/2012    Past Medical History:  Diagnosis Date  . Anxiety   . Benign prostatic hypertrophy    Nocturia  . CAD (coronary artery disease)    a.  NSTEMI 10/2012 s/p DES to LAD & DES to 1st diagonal with residual diffuse nonobstructive dz in LCx/RCA   . Chronic kidney disease,  stage 3, mod decreased GFR    Creatinine of 1.5 in 03/2009, STAGE 4 now  . Degenerative joint disease   . Depression    hx of  . Diabetes mellitus, type II ([place]) 12/10/2012.  . Diastolic heart failure    Pedal edema, "a long time ago"  . Erectile dysfunction   . Flu 2013   hx of  . GERD (gastroesophageal reflux disease)   . Gout   . Hemorrhoids   . High cholesterol   . Hypertension    Exercise induced  . NSVT (nonsustained ventricular tachycardia) (HCC)    Exercise induced  . Obesity   . 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 Tubular adenoma 2014    Past Surgical History:  Procedure Laterality Date  . BACK SURGERY  2007   x2  . CARDIAC CATHETERIZATION     with stent placement  . COLONOSCOPY WITH ESOPHAGOGASTRODUODENOSCOPY (EGD) N/A 11/23/2013   Dr. Oneida Alar- TCS= left sided colitis and mild proctitis, moderate internal hemorrhoids, small nodule in the rectum, bx=tubular adenoma, EGD=-mild non-erosive gastritis  . HERNIA REPAIR    . LEFT HEART CATHETERIZATION WITH CORONARY ANGIOGRAM N/A 11/16/2012   Procedure: LEFT HEART CATHETERIZATION WITH CORONARY ANGIOGRAM;  Surgeon: Sherren Mocha, MD;  Location: [facility] CATH LAB;  Service: Cardiovascular;  Laterality: N/A;  . PERCUTANEOUS CORONARY STENT INTERVENTION (PCI-S)  11/16/2012   Procedure: PERCUTANEOUS CORONARY STENT INTERVENTION (PCI-S);  Surgeon: Sherren Mocha, MD;  Location: [facility] CATH LAB;  Service: Cardiovascular;;  . TOTAL KNEE ARTHROPLASTY Right 11/15/2013   Procedure: RIGHT TOTAL KNEE ARTHROPLASTY;  Surgeon: Mauri Pole, MD;  Location: [facility] ORS;  Service: Orthopedics;  Laterality: Right;  . TOTAL KNEE ARTHROPLASTY Left 03/07/2015   Procedure: LEFT TOTAL KNEE ARTHROPLASTY;  Surgeon: Paralee Cancel, MD;  Location: [facility] ORS;  Service: Orthopedics;  Laterality: Left;    Family History  Problem Relation Age of Onset  . Hypertension Other   . Cancer Other   . Heart disease Other   . Diabetes Other   .  Hypertension Mother   . Cancer Father        brain  . CAD Brother   . Colon cancer Neg Hx     Social History   Socioeconomic History  . Marital status: Widowed    Spouse name: Not on file  . Number of children: Not on file  . Years of education: Not on file  . Highest education level: Not on file  Occupational History  . Occupation: Disabled    Employer: RETIRED  Tobacco Use  . Smoking status: Former Smoker    Packs/day: 1.00    Years: 30.00    Pack years: 30.00    Types: Cigarettes    Start date: 01/22/1965    Quit date:  12/23/1992    Years since quitting: 27.3  . Smokeless tobacco: Never Used  Substance and Sexual Activity  . Alcohol use: No    Alcohol/week: 0.0 standard drinks  . Drug use: No  . Sexual activity: Not Currently  Other Topics Concern  . Not on file  Social History Narrative   Married   Right handed   One story home no caffeine   Social Determinants of Health   Financial Resource Strain:   . Difficulty of Paying Living Expenses:   Food Insecurity:   . Worried About Charity fundraiser in the Last Year:   . Arboriculturist in the Last Year:   Transportation Needs:   . Film/video editor (Medical):   Marland Kitchen Lack of Transportation (Non-Medical):   Physical Activity:   . Days of Exercise per Week:   . Minutes of Exercise per Session:   Stress:   . Feeling of Stress :   Social Connections:   . Frequency of Communication with Friends and Family:   . Frequency of Social Gatherings with Friends and Family:   . Attends Religious Services:   . Active Member of Clubs or Organizations:   . Attends Archivist Meetings:   Marland Kitchen Marital Status:   Intimate Partner Violence:   . Fear of Current or Ex-Partner:   . Emotionally Abused:   Marland Kitchen Physically Abused:   . Sexually Abused:       ROS:  General: Negative for anorexia, weight loss, fever, chills, fatigue, weakness. Eyes: Negative for vision changes.  ENT: Negative for hoarseness, difficulty  swallowing , nasal congestion. CV: Negative for chest pain, angina, palpitations, dyspnea on exertion, peripheral edema.  Respiratory: Negative for dyspnea at rest, dyspnea on exertion, cough, sputum, wheezing.  GI: See history of present illness. GU:  Negative for dysuria, hematuria, urinary incontinence, urinary frequency, nocturnal urination.  MS: Negative for joint pain, low back pain.  Derm: Negative for rash or itching.  Neuro: Negative for weakness, abnormal sensation, seizure, frequent headaches, memory loss, confusion.  Psych: Negative for anxiety, depression, suicidal ideation, hallucinations.  Endo: Negative for unusual weight change.  Heme: Negative for bruising or bleeding. Allergy: Negative for rash or hives.    Physical Examination:  BP (!) 145/60   Pulse (!) 42   Temp (!) 97.3 F (36.3 C) (Temporal)   Ht 6' 2"  (1.88 m)   Wt 293 lb 3.2 oz (133 kg)   BMI 37.64 kg/m    General: Well-nourished, well-developed in no acute distress.  Head: Normocephalic, atraumatic.   Eyes: Conjunctiva pink, no icterus. Mouth: masked Neck: Supple without thyromegaly, masses, or lymphadenopathy.  Lungs: Clear to auscultation bilaterally.  Heart: Regular rate and rhythm, no murmurs rubs or gallops.  Abdomen: Bowel sounds are normal, nontender, nondistended, no hepatosplenomegaly or masses, no abdominal bruits or    hernia , no rebound or guarding.   Rectal: not performed Extremities: No lower extremity edema. No clubbing or deformities.  Neuro: Alert and oriented x 4 , grossly normal neurologically.  Skin: Warm and dry, no rash or jaundice.   Psych: Alert and cooperative, normal mood and affect.  Labs: Lab Results  Component Value Date   HGBA1C 7.2 (A) 02/28/2020   Lab Results  Component Value Date   CREATININE 2.02 (H) 10/28/2019   BUN 64 (H) 10/28/2019   NA 139 10/28/2019   K 4.1 10/28/2019   CL 110 10/28/2019   CO2 22 10/28/2019   Lab Results  Component Value Date    ALT 13 10/28/2019   AST 12 (L) 10/28/2019   ALKPHOS 46 10/28/2019   BILITOT 0.5 10/28/2019   Lab Results  Component Value Date   WBC 6.3 10/28/2019   HGB 9.5 (L) 10/28/2019   HCT 30.5 (L) 10/28/2019   MCV 101.7 (H) 10/28/2019   PLT 203 10/28/2019   SEE HPI FOR MORE RECENT LABS   Imaging Studies: No results found.

## 2020-05-05 NOTE — Patient Instructions (Signed)
Colonoscopy as scheduled. See separate instructions.

## 2020-05-10 ENCOUNTER — Encounter: Payer: Self-pay | Admitting: Gastroenterology

## 2020-05-10 NOTE — Assessment & Plan Note (Signed)
[name] 74 year old gentleman with history of iron deficiency anemia, chronic kidney disease stage IV, hypertension, CHF, history of adenomatous colon polyp in 2014, presenting at the request of his nephrologist, Dr. Theador Hawthorne for further evaluation of iron deficiency anemia.  Patient doing well from a GI standpoint.  He had evidence of left-sided colitis/proctitis suspicious for ischemic colitis, small rectal tubular adenoma removed back in 2014.  Dr. Gala Romney advised 5-year surveillance colonoscopy which has not been done.  Current hemoglobin is normal (was in the 9 range back in November 2020), and iron saturations low.  Offered colonoscopy, plan for deep sedation given polypharmacy and multiple comorbidities.  I have discussed the risks, alternatives, benefits with regards to but not limited to the risk of reaction to medication, bleeding, infection, perforation and the patient is agreeable to proceed. Written consent to be obtained.

## 2020-05-15 DIAGNOSIS — H35033 Hypertensive retinopathy, bilateral: Secondary | ICD-10-CM | POA: Diagnosis not present

## 2020-05-15 DIAGNOSIS — H524 Presbyopia: Secondary | ICD-10-CM | POA: Diagnosis not present

## 2020-05-15 DIAGNOSIS — H40023 Open angle with borderline findings, high risk, bilateral: Secondary | ICD-10-CM | POA: Diagnosis not present

## 2020-05-15 DIAGNOSIS — H2513 Age-related nuclear cataract, bilateral: Secondary | ICD-10-CM | POA: Diagnosis not present

## 2020-05-15 DIAGNOSIS — H04123 Dry eye syndrome of bilateral lacrimal glands: Secondary | ICD-10-CM | POA: Diagnosis not present

## 2020-05-26 DIAGNOSIS — M1991 Primary osteoarthritis, unspecified site: Secondary | ICD-10-CM | POA: Diagnosis not present

## 2020-05-26 DIAGNOSIS — G894 Chronic pain syndrome: Secondary | ICD-10-CM | POA: Diagnosis not present

## 2020-05-26 DIAGNOSIS — I10 Essential (primary) hypertension: Secondary | ICD-10-CM | POA: Diagnosis not present

## 2020-05-26 DIAGNOSIS — E1140 Type 2 diabetes mellitus with diabetic neuropathy, unspecified: Secondary | ICD-10-CM | POA: Diagnosis not present

## 2020-05-26 DIAGNOSIS — M109 Gout, unspecified: Secondary | ICD-10-CM | POA: Diagnosis not present

## 2020-05-26 DIAGNOSIS — M2550 Pain in unspecified joint: Secondary | ICD-10-CM | POA: Diagnosis not present

## 2020-05-31 DIAGNOSIS — M545 Low back pain: Secondary | ICD-10-CM | POA: Diagnosis not present

## 2020-06-12 ENCOUNTER — Other Ambulatory Visit: Payer: Self-pay

## 2020-06-12 ENCOUNTER — Ambulatory Visit (INDEPENDENT_AMBULATORY_CARE_PROVIDER_SITE_OTHER): Payer: Medicare Other | Admitting: Endocrinology

## 2020-06-12 ENCOUNTER — Encounter: Payer: Self-pay | Admitting: Endocrinology

## 2020-06-12 VITALS — BP 120/70 | HR 64 | Ht 74.0 in | Wt 281.0 lb

## 2020-06-12 DIAGNOSIS — E1169 Type 2 diabetes mellitus with other specified complication: Secondary | ICD-10-CM

## 2020-06-12 DIAGNOSIS — E119 Type 2 diabetes mellitus without complications: Secondary | ICD-10-CM

## 2020-06-12 DIAGNOSIS — E669 Obesity, unspecified: Secondary | ICD-10-CM | POA: Diagnosis not present

## 2020-06-12 LAB — POCT GLYCOSYLATED HEMOGLOBIN (HGB A1C): Hemoglobin A1C: 7.1 % — AB (ref 4.0–5.6)

## 2020-06-12 NOTE — Patient Instructions (Signed)
check your blood sugar twice a day.  vary the time of day when you check, between before the 3 meals, and at bedtime.  also check if you have symptoms of your blood sugar being too high or too low.  please keep a record of the readings and bring it to your next appointment here (or you can bring the meter itself).  You can write it on any piece of paper.  please call us sooner if your blood sugar goes below 70, or if you have a lot of readings over 200.  Please continue the same insulin.   Please come back for a follow-up appointment in 3 months.

## 2020-06-12 NOTE — Progress Notes (Signed)
Subjective:    Patient ID: [name], male    DOB: [birth year], 74 y.o.   MRN: [id number]  HPI Pt returns for f/u of diabetes mellitus:  DM type: Insulin-requiring type 2 Dx'ed: 6803 Complications: CAD, PAD, and stage 4 CRI.   Therapy: insulin since 2020.  DKA: never Severe hypoglycemia: never.  Pancreatitis: never Pancreatic imaging: normal on 2017 CT SDOH: he declines brand name meds, due to cost.   Other: He did not tolerate metformin (nausea); he declines multiple daily injections.  Interval history: no cbg record, but states fasting cbg's vary from 110-150.  It is in general higher as the day goes on.  pt states he feels well in general.  Pt says he takes the insulin as rx'ed.   Past Medical History:  Diagnosis Date  . Anxiety   . Benign prostatic hypertrophy    Nocturia  . CAD (coronary artery disease)    a.  NSTEMI 10/2012 s/p DES to LAD & DES to 1st diagonal with residual diffuse nonobstructive dz in LCx/RCA   . Chronic kidney disease, stage 3, mod decreased GFR    Creatinine of 1.5 in 03/2009, STAGE 4 now  . Degenerative joint disease   . Depression    hx of  . Diabetes mellitus, type II ([place]) 12/10/2012.  . Diastolic heart failure    Pedal edema, "a long time ago"  . Erectile dysfunction   . Flu 2013   hx of  . GERD (gastroesophageal reflux disease)   . Gout   . Hemorrhoids   . High cholesterol   . Hypertension    Exercise induced  . NSVT (nonsustained ventricular tachycardia) (HCC)    Exercise induced  . Obesity   . 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 Tubular adenoma 2014    Past Surgical History:  Procedure Laterality Date  . BACK SURGERY  2007   x2  . CARDIAC CATHETERIZATION     with stent placement  . COLONOSCOPY WITH ESOPHAGOGASTRODUODENOSCOPY (EGD) N/A 11/23/2013   Dr. Oneida Alar- TCS= left sided colitis and mild proctitis likely due to ischemic colitis, moderate internal hemorrhoids, small nodule in the rectum,  bx=tubular adenoma, EGD=-mild non-erosive gastritis  . HERNIA REPAIR    . LEFT HEART CATHETERIZATION WITH CORONARY ANGIOGRAM N/A 11/16/2012   Procedure: LEFT HEART CATHETERIZATION WITH CORONARY ANGIOGRAM;  Surgeon: Sherren Mocha, MD;  Location: [facility] CATH LAB;  Service: Cardiovascular;  Laterality: N/A;  . PERCUTANEOUS CORONARY STENT INTERVENTION (PCI-S)  11/16/2012   Procedure: PERCUTANEOUS CORONARY STENT INTERVENTION (PCI-S);  Surgeon: Sherren Mocha, MD;  Location: [facility] CATH LAB;  Service: Cardiovascular;;  . TOTAL KNEE ARTHROPLASTY Right 11/15/2013   Procedure: RIGHT TOTAL KNEE ARTHROPLASTY;  Surgeon: Mauri Pole, MD;  Location: [facility];  Service: Orthopedics;  Laterality: Right;  . TOTAL KNEE ARTHROPLASTY Left 03/07/2015   Procedure: LEFT TOTAL KNEE ARTHROPLASTY;  Surgeon: Paralee Cancel, MD;  Location: [facility];  Service: Orthopedics;  Laterality: Left;    Social History   Socioeconomic History  . Marital status: Widowed    Spouse name: Not on file  . Number of children: Not on file  . Years of education: Not on file  . Highest education level: Not on file  Occupational History  . Occupation: Disabled    Employer: RETIRED  Tobacco Use  . Smoking status: Former Smoker    Packs/day: 1.00    Years: 30.00    Pack years: 30.00    Types:  Cigarettes    Start date: 01/22/1965    Quit date: 12/23/1992    Years since quitting: 27.4  . Smokeless tobacco: Never Used  Vaping Use  . Vaping Use: Never used  Substance and Sexual Activity  . Alcohol use: No    Alcohol/week: 0.0 standard drinks  . Drug use: No  . Sexual activity: Not Currently  Other Topics Concern  . Not on file  Social History Narrative   Married   Right handed   One story home no caffeine   Social Determinants of Health   Financial Resource Strain:   . Difficulty of Paying Living Expenses:   Food Insecurity:   . Worried About Charity fundraiser in the Last Year:   . Arboriculturist in the Last Year:     Transportation Needs:   . Film/video editor (Medical):   Marland Kitchen Lack of Transportation (Non-Medical):   Physical Activity:   . Days of Exercise per Week:   . Minutes of Exercise per Session:   Stress:   . Feeling of Stress :   Social Connections:   . Frequency of Communication with Friends and Family:   . Frequency of Social Gatherings with Friends and Family:   . Attends Religious Services:   . Active Member of Clubs or Organizations:   . Attends Archivist Meetings:   Marland Kitchen Marital Status:   Intimate Partner Violence:   . Fear of Current or Ex-Partner:   . Emotionally Abused:   Marland Kitchen Physically Abused:   . Sexually Abused:     Current Outpatient Medications on File Prior to Visit  Medication Sig Dispense Refill  . acarbose (PRECOSE) 50 MG tablet Take 50 mg by mouth 3 (three) times daily with meals.     Marland Kitchen acetaminophen (TYLENOL) 650 MG CR tablet Take 1,300 mg by mouth daily.    Marland Kitchen amLODipine (NORVASC) 10 MG tablet Take 10 mg by mouth daily.    Marland Kitchen aspirin EC 81 MG tablet Take 1 tablet (81 mg total) by mouth daily. 90 tablet 3  . calcitRIOL (ROCALTROL) 0.25 MCG capsule Take 1 capsule by mouth daily.    . cholecalciferol (VITAMIN D) 1000 units tablet Take 1,000 Units daily by mouth.    . cloNIDine (CATAPRES) 0.1 MG tablet Take 0.1 mg by mouth 2 (two) times daily.    Marland Kitchen docusate sodium (COLACE) 100 MG capsule Take 100 mg by mouth daily as needed for mild constipation.    . furosemide (LASIX) 40 MG tablet Take 1 tablet (40 mg total) by mouth 2 (two) times daily. 60 tablet 3  . gabapentin (NEURONTIN) 300 MG capsule Take 1 capsule by mouth 3 (three) times daily.     Marland Kitchen HYDROcodone-acetaminophen (NORCO) 10-325 MG tablet Take 1-2 tablets by mouth 4 (four) times daily as needed for moderate pain or severe pain. 10 tablet 0  . insulin NPH Human (NOVOLIN N) 100 UNIT/ML injection Inject 0.2 mLs (20 Units total) into the skin every morning. Ands syringes 1/day 10 mL 11  . lactulose (CHRONULAC)  10 GM/15ML solution Take 10 g by mouth 3 (three) times daily as needed for mild constipation.    . nitroGLYCERIN (NITROSTAT) 0.4 MG SL tablet PLACE ONE TABLET UNDER THE TONGUE EVERY 5 MINUTES FOR 3 DOSES AS NEEDED (Patient taking differently: Place 0.4 mg under the tongue every 5 (five) minutes as needed. ) 25 tablet 3  . omeprazole (PRILOSEC) 20 MG capsule Take 20 mg by mouth as needed.     Marland Kitchen  allopurinol (ZYLOPRIM) 100 MG tablet Take 1 tablet (100 mg total) by mouth daily. 30 tablet 2  . pravastatin (PRAVACHOL) 20 MG tablet Take 1 tablet (20 mg total) by mouth every evening. 90 tablet 3   No current facility-administered medications on file prior to visit.    Allergies  Allergen Reactions  . Niacin And Related Other (See Comments)    Reaction: flushing and burning side effects even with extended release    Family History  Problem Relation Age of Onset  . Hypertension Other   . Cancer Other   . Heart disease Other   . Diabetes Other   . Hypertension Mother   . Cancer Father        brain  . CAD Brother   . Colon cancer Neg Hx     BP 120/70   Pulse 64   Ht 6' 2"  (1.88 m)   Wt 281 lb (127.5 kg)   SpO2 95%   BMI 36.08 kg/m    Review of Systems He denies hypoglycemia.  He has lost a few lbs    Objective:   Physical Exam VITAL SIGNS:  See vs page GENERAL: no distress Pulses: dorsalis pedis intact bilat.   MSK: no deformity of the feet CV: 2+ bilat leg edema Skin:  no ulcer on the feet, but the skin is thickened.  normal color and temp on the feet.  Old healed surgical scar on the right foot.   Neuro: sensation is intact to touch on the feet, but decreased from normal.  Ext: there is bilateral onychomycosis of the toenails.    Lab Results  Component Value Date   CREATININE 2.02 (H) 10/28/2019   BUN 64 (H) 10/28/2019   NA 139 10/28/2019   K 4.1 10/28/2019   CL 110 10/28/2019   CO2 22 10/28/2019   Lab Results  Component Value Date   HGBA1C 7.1 (A) 06/12/2020         Assessment & Plan:  Insulin-requiring type 2 DM, with PAD: this is the best control this pt should aim for, given this regimen, which does match insulin to his changing needs throughout the day   Patient Instructions  check your blood sugar twice a day.  vary the time of day when you check, between before the 3 meals, and at bedtime.  also check if you have symptoms of your blood sugar being too high or too low.  please keep a record of the readings and bring it to your next appointment here (or you can bring the meter itself).  You can write it on any piece of paper.  please call us sooner if your blood sugar goes below 70, or if you have a lot of readings over 200.  Please continue the same insulin.   Please come back for a follow-up appointment in 3 months.

## 2020-06-13 DIAGNOSIS — E211 Secondary hyperparathyroidism, not elsewhere classified: Secondary | ICD-10-CM | POA: Diagnosis not present

## 2020-06-13 DIAGNOSIS — R809 Proteinuria, unspecified: Secondary | ICD-10-CM | POA: Diagnosis not present

## 2020-06-13 DIAGNOSIS — N189 Chronic kidney disease, unspecified: Secondary | ICD-10-CM | POA: Diagnosis not present

## 2020-06-13 DIAGNOSIS — E1129 Type 2 diabetes mellitus with other diabetic kidney complication: Secondary | ICD-10-CM | POA: Diagnosis not present

## 2020-06-13 DIAGNOSIS — E1122 Type 2 diabetes mellitus with diabetic chronic kidney disease: Secondary | ICD-10-CM | POA: Diagnosis not present

## 2020-06-15 ENCOUNTER — Telehealth: Payer: Self-pay | Admitting: Internal Medicine

## 2020-06-15 NOTE — Telephone Encounter (Signed)
Pt came to front office window saying he hasn't heard from [place] about when his colonoscopy was going to be. I told him we were waiting on the procedure schedule and someone would be in touch with him. [phone]

## 2020-06-15 NOTE — Telephone Encounter (Signed)
LMOVM for pt  Patient on waiting list to be called

## 2020-06-16 DIAGNOSIS — I129 Hypertensive chronic kidney disease with stage 1 through stage 4 chronic kidney disease, or unspecified chronic kidney disease: Secondary | ICD-10-CM | POA: Diagnosis not present

## 2020-06-16 DIAGNOSIS — E211 Secondary hyperparathyroidism, not elsewhere classified: Secondary | ICD-10-CM | POA: Diagnosis not present

## 2020-06-16 DIAGNOSIS — N189 Chronic kidney disease, unspecified: Secondary | ICD-10-CM | POA: Diagnosis not present

## 2020-06-16 DIAGNOSIS — R809 Proteinuria, unspecified: Secondary | ICD-10-CM | POA: Diagnosis not present

## 2020-06-16 DIAGNOSIS — I5032 Chronic diastolic (congestive) heart failure: Secondary | ICD-10-CM | POA: Diagnosis not present

## 2020-06-21 ENCOUNTER — Telehealth: Payer: Self-pay | Admitting: Emergency Medicine

## 2020-06-21 ENCOUNTER — Telehealth: Payer: Self-pay | Admitting: *Deleted

## 2020-06-21 MED ORDER — NA SULFATE-K SULFATE-MG SULF 17.5-3.13-1.6 GM/177ML PO SOLN: 1.0000 | Freq: Once | ORAL | 0 refills | Status: AC

## 2020-06-21 NOTE — Telephone Encounter (Signed)
Pt called stated he was returning mindys call. Notified him that mindy is with another pt and will call him back as soon as she is able. Pt stated he understood

## 2020-06-21 NOTE — Telephone Encounter (Signed)
Called pt to schedule TCS with propofol with RMR. He was driving on highway and will call back

## 2020-06-21 NOTE — Addendum Note (Signed)
Addended by: Cheron Every on: 06/21/2020 04:22 PM   Modules accepted: Orders

## 2020-06-21 NOTE — Telephone Encounter (Signed)
Patient called back. He has been scheduled for 8/2 at 10:15am. Patient aware will need pre-op/covid test appt prior. Advised will mail this to him with his prep instrucitons. Rx sent to [facility] pharmacy per request. Orders entered.

## 2020-06-23 ENCOUNTER — Encounter: Payer: Self-pay | Admitting: *Deleted

## 2020-06-30 DIAGNOSIS — M1991 Primary osteoarthritis, unspecified site: Secondary | ICD-10-CM | POA: Diagnosis not present

## 2020-06-30 DIAGNOSIS — R42 Dizziness and giddiness: Secondary | ICD-10-CM | POA: Diagnosis not present

## 2020-06-30 DIAGNOSIS — E119 Type 2 diabetes mellitus without complications: Secondary | ICD-10-CM | POA: Diagnosis not present

## 2020-06-30 DIAGNOSIS — I10 Essential (primary) hypertension: Secondary | ICD-10-CM | POA: Diagnosis not present

## 2020-07-03 ENCOUNTER — Telehealth: Payer: Self-pay | Admitting: Internal Medicine

## 2020-07-03 DIAGNOSIS — G5750 Tarsal tunnel syndrome, unspecified lower limb: Secondary | ICD-10-CM | POA: Diagnosis not present

## 2020-07-03 DIAGNOSIS — M1990 Unspecified osteoarthritis, unspecified site: Secondary | ICD-10-CM | POA: Diagnosis not present

## 2020-07-03 DIAGNOSIS — M7731 Calcaneal spur, right foot: Secondary | ICD-10-CM | POA: Diagnosis not present

## 2020-07-03 DIAGNOSIS — M7732 Calcaneal spur, left foot: Secondary | ICD-10-CM | POA: Diagnosis not present

## 2020-07-03 DIAGNOSIS — M25579 Pain in unspecified ankle and joints of unspecified foot: Secondary | ICD-10-CM | POA: Diagnosis not present

## 2020-07-03 NOTE — Telephone Encounter (Signed)
PATIENT CALLED TO LET us KNOW HE IS A DIABETIC

## 2020-07-03 NOTE — Telephone Encounter (Signed)
Patient aware instructions for his diabetic medications have been adjusted

## 2020-07-11 DIAGNOSIS — L110 Acquired keratosis follicularis: Secondary | ICD-10-CM | POA: Diagnosis not present

## 2020-07-11 DIAGNOSIS — I739 Peripheral vascular disease, unspecified: Secondary | ICD-10-CM | POA: Diagnosis not present

## 2020-07-11 DIAGNOSIS — M79672 Pain in left foot: Secondary | ICD-10-CM | POA: Diagnosis not present

## 2020-07-11 DIAGNOSIS — E1140 Type 2 diabetes mellitus with diabetic neuropathy, unspecified: Secondary | ICD-10-CM | POA: Diagnosis not present

## 2020-07-11 DIAGNOSIS — M79671 Pain in right foot: Secondary | ICD-10-CM | POA: Diagnosis not present

## 2020-07-17 NOTE — Patient Instructions (Signed)
COBE VINEY  07/17/2020     @PREFPERIOPPHARMACY @   Your procedure is scheduled on  07/24/2020.  [facility] to [facility] at  [facility]  Call this number if you have problems the morning of surgery:  [phone]   Remember:  Follow the diet and prep instructions given to you by Dr Roseanne Kaufman office.                     Take these medicines the morning of surgery with A SIP OF WATER b clonidine, gabapentin, hydrocodone(if needed), antivert(if needed), prilosec.   Do not wear jewelry, make-up or nail polish.  Do not wear lotions, powders, or perfumes. Please wear deodorant and brush your teeth.  Do not shave 48 hours prior to surgery.  Men may shave face and neck.  Do not bring valuables to the hospital.  [facility] is not responsible for any belongings or valuables.  Contacts, dentures or bridgework may not be worn into surgery.  Leave your suitcase in the car.  After surgery it may be brought to your room.  For patients admitted to the hospital, discharge time will be determined by your treatment team.  Patients discharged the day of surgery will not be allowed to drive home.   Name and phone number of your driver:   family Special instructions:  DO NOT smoke the morning of your procedure.  Please read over the following fact sheets that you were given. Anesthesia Post-op Instructions and Care and Recovery After Surgery       Colonoscopy, Adult, Care After This sheet gives you information about how to care for yourself after your procedure. Your health care provider may also give you more specific instructions. If you have problems or questions, contact your health care provider. What can I expect after the procedure? After the procedure, it is common to have:  A small amount of blood in your stool for 24 hours after the procedure.  Some gas.  Mild cramping or bloating of your abdomen. Follow these instructions at home: Eating and drinking   Drink enough  fluid to keep your urine pale yellow.  Follow instructions from your health care provider about eating or drinking restrictions.  Resume your normal diet as instructed by your health care provider. Avoid heavy or fried foods that are hard to digest. Activity  Rest as told by your health care provider.  Avoid sitting for a long time without moving. Get up to take short walks every 1-2 hours. This is important to improve blood flow and breathing. Ask for help if you feel weak or unsteady.  Return to your normal activities as told by your health care provider. Ask your health care provider what activities are safe for you. Managing cramping and bloating   Try walking around when you have cramps or feel bloated.  Apply heat to your abdomen as told by your health care provider. Use the heat source that your health care provider recommends, such as a moist heat pack or a heating pad. ? Place a towel between your skin and the heat source. ? Leave the heat on for 20-30 minutes. ? Remove the heat if your skin turns bright red. This is especially important if you are unable to feel pain, heat, or cold. You may have a greater risk of getting burned. General instructions  For the first 24 hours after the procedure: ? Do not drive or use machinery. ?  Do not sign important documents. ? Do not drink alcohol. ? Do your regular daily activities at a slower pace than normal. ? Eat soft foods that are easy to digest.  Take over-the-counter and prescription medicines only as told by your health care provider.  Keep all follow-up visits as told by your health care provider. This is important. Contact a health care provider if:  You have blood in your stool 2-3 days after the procedure. Get help right away if you have:  More than a small spotting of blood in your stool.  Large blood clots in your stool.  Swelling of your abdomen.  Nausea or vomiting.  A fever.  Increasing pain in your  abdomen that is not relieved with medicine. Summary  After the procedure, it is common to have a small amount of blood in your stool. You may also have mild cramping and bloating of your abdomen.  For the first 24 hours after the procedure, do not drive or use machinery, sign important documents, or drink alcohol.  Get help right away if you have a lot of blood in your stool, nausea or vomiting, a fever, or increased pain in your abdomen. This information is not intended to replace advice given to you by your health care provider. Make sure you discuss any questions you have with your health care provider. Document Revised: 07/05/2019 Document Reviewed: 07/05/2019 Elsevier Patient Education  [place] After These instructions provide you with information about caring for yourself after your procedure. Your health care provider may also give you more specific instructions. Your treatment has been planned according to current medical practices, but problems sometimes occur. Call your health care provider if you have any problems or questions after your procedure. What can I expect after the procedure? After your procedure, you may:  Feel sleepy for several hours.  Feel clumsy and have poor balance for several hours.  Feel forgetful about what happened after the procedure.  Have poor judgment for several hours.  Feel nauseous or vomit.  Have a sore throat if you had a breathing tube during the procedure. Follow these instructions at home: For at least 24 hours after the procedure:      Have a responsible adult stay with you. It is important to have someone help care for you until you are awake and alert.  Rest as needed.  Do not: ? Participate in activities in which you could fall or become injured. ? Drive. ? Use heavy machinery. ? Drink alcohol. ? Take sleeping pills or medicines that cause drowsiness. ? Make important decisions or  sign legal documents. ? Take care of children on your own. Eating and drinking  Follow the diet that is recommended by your health care provider.  If you vomit, drink water, juice, or soup when you can drink without vomiting.  Make sure you have little or no nausea before eating solid foods. General instructions  Take over-the-counter and prescription medicines only as told by your health care provider.  If you have sleep apnea, surgery and certain medicines can increase your risk for breathing problems. Follow instructions from your health care provider about wearing your sleep device: ? Anytime you are sleeping, including during daytime naps. ? While taking prescription pain medicines, sleeping medicines, or medicines that make you drowsy.  If you smoke, do not smoke without supervision.  Keep all follow-up visits as told by your health care provider. This is important. Contact a health  care provider if:  You keep feeling nauseous or you keep vomiting.  You feel light-headed.  You develop a rash.  You have a fever. Get help right away if:  You have trouble breathing. Summary  For several hours after your procedure, you may feel sleepy and have poor judgment.  Have a responsible adult stay with you for at least 24 hours or until you are awake and alert. This information is not intended to replace advice given to you by your health care provider. Make sure you discuss any questions you have with your health care provider. Document Revised: 03/09/2018 Document Reviewed: 03/31/2016 Elsevier Patient Education  [place].

## 2020-07-19 DIAGNOSIS — M159 Polyosteoarthritis, unspecified: Secondary | ICD-10-CM | POA: Diagnosis not present

## 2020-07-19 DIAGNOSIS — G894 Chronic pain syndrome: Secondary | ICD-10-CM | POA: Diagnosis not present

## 2020-07-19 DIAGNOSIS — M5136 Other intervertebral disc degeneration, lumbar region: Secondary | ICD-10-CM | POA: Diagnosis not present

## 2020-07-20 ENCOUNTER — Other Ambulatory Visit: Payer: Self-pay

## 2020-07-20 ENCOUNTER — Other Ambulatory Visit (HOSPITAL_COMMUNITY)
Admission: RE | Admit: 2020-07-20 | Discharge: 2020-07-20 | Disposition: A | Discharge status: Home / Self Care | Payer: Medicare Other | Source: Ambulatory Visit | Attending: Internal Medicine | Admitting: Internal Medicine

## 2020-07-20 ENCOUNTER — Encounter (HOSPITAL_COMMUNITY): Payer: Self-pay

## 2020-07-20 ENCOUNTER — Encounter (HOSPITAL_COMMUNITY)
Admission: RE | Admit: 2020-07-20 | Discharge: 2020-07-20 | Disposition: A | Discharge status: Home / Self Care | Payer: Medicare Other | Source: Ambulatory Visit | Attending: Internal Medicine | Admitting: Internal Medicine

## 2020-07-20 DIAGNOSIS — Z01818 Encounter for other preprocedural examination: Secondary | ICD-10-CM | POA: Insufficient documentation

## 2020-07-20 DIAGNOSIS — Z20822 Contact with and (suspected) exposure to covid-19: Secondary | ICD-10-CM | POA: Insufficient documentation

## 2020-07-20 HISTORY — DX: Acute myocardial infarction, unspecified: I21.9

## 2020-07-20 LAB — BASIC METABOLIC PANEL
Anion gap: 11 (ref 5–15)
BUN: 30 mg/dL — ABNORMAL HIGH (ref 8–23)
CO2: 24 mmol/L (ref 22–32)
Calcium: 8.8 mg/dL — ABNORMAL LOW (ref 8.9–10.3)
Chloride: 105 mmol/L (ref 98–111)
Creatinine, Ser: 2.06 mg/dL — ABNORMAL HIGH (ref 0.61–1.24)
GFR calc Af Amer: 35 mL/min — ABNORMAL LOW (ref >60)
GFR calc non Af Amer: 31 mL/min — ABNORMAL LOW (ref >60)
Glucose, Bld: 143 mg/dL — ABNORMAL HIGH (ref 70–99)
Potassium: 3.6 mmol/L (ref 3.5–5.1)
Sodium: 140 mmol/L (ref 135–145)

## 2020-07-20 LAB — CBC WITH DIFFERENTIAL/PLATELET
Abs Immature Granulocytes: 0.03 10*3/uL (ref 0.00–0.07)
Basophils Absolute: 0 10*3/uL (ref 0.0–0.1)
Basophils Relative: 0 %
Eosinophils Absolute: 0.1 10*3/uL (ref 0.0–0.5)
Eosinophils Relative: 1 %
HCT: 36.9 % — ABNORMAL LOW (ref 39.0–52.0)
Hemoglobin: 11.9 g/dL — ABNORMAL LOW (ref 13.0–17.0)
Immature Granulocytes: 0 %
Lymphocytes Relative: 13 %
Lymphs Abs: 1.1 10*3/uL (ref 0.7–4.0)
MCH: 31 pg (ref 26.0–34.0)
MCHC: 32.2 g/dL (ref 30.0–36.0)
MCV: 96.1 fL (ref 80.0–100.0)
Monocytes Absolute: 0.8 10*3/uL (ref 0.1–1.0)
Monocytes Relative: 9 %
Neutro Abs: 6.5 10*3/uL (ref 1.7–7.7)
Neutrophils Relative %: 77 %
Platelets: 260 10*3/uL (ref 150–400)
RBC: 3.84 MIL/uL — ABNORMAL LOW (ref 4.22–5.81)
RDW: 14.1 % (ref 11.5–15.5)
WBC: 8.5 10*3/uL (ref 4.0–10.5)
nRBC: 0 % (ref 0.0–0.2)

## 2020-07-20 LAB — SARS CORONAVIRUS 2 (TAT 6-24 HRS): SARS Coronavirus 2: NEGATIVE

## 2020-07-24 ENCOUNTER — Encounter (HOSPITAL_COMMUNITY): Payer: Self-pay | Admitting: Internal Medicine

## 2020-07-24 ENCOUNTER — Ambulatory Visit (HOSPITAL_COMMUNITY)
Admission: RE | Admit: 2020-07-24 | Discharge: 2020-07-24 | Disposition: A | Discharge status: Home / Self Care | Payer: Medicare Other | Attending: Internal Medicine | Admitting: Internal Medicine

## 2020-07-24 ENCOUNTER — Encounter (HOSPITAL_COMMUNITY)
Admission: RE | Disposition: A | Discharge status: Home / Self Care | Payer: Self-pay | Source: Home / Self Care | Attending: Internal Medicine

## 2020-07-24 ENCOUNTER — Ambulatory Visit (HOSPITAL_COMMUNITY): Payer: Medicare Other | Admitting: Anesthesiology

## 2020-07-24 DIAGNOSIS — Z833 Family history of diabetes mellitus: Secondary | ICD-10-CM | POA: Insufficient documentation

## 2020-07-24 DIAGNOSIS — Z8249 Family history of ischemic heart disease and other diseases of the circulatory system: Secondary | ICD-10-CM | POA: Insufficient documentation

## 2020-07-24 DIAGNOSIS — I252 Old myocardial infarction: Secondary | ICD-10-CM | POA: Insufficient documentation

## 2020-07-24 DIAGNOSIS — Z96653 Presence of artificial knee joint, bilateral: Secondary | ICD-10-CM | POA: Diagnosis not present

## 2020-07-24 DIAGNOSIS — I130 Hypertensive heart and chronic kidney disease with heart failure and stage 1 through stage 4 chronic kidney disease, or unspecified chronic kidney disease: Secondary | ICD-10-CM | POA: Insufficient documentation

## 2020-07-24 DIAGNOSIS — Z794 Long term (current) use of insulin: Secondary | ICD-10-CM | POA: Insufficient documentation

## 2020-07-24 DIAGNOSIS — I110 Hypertensive heart disease with heart failure: Secondary | ICD-10-CM | POA: Diagnosis not present

## 2020-07-24 DIAGNOSIS — K5730 Diverticulosis of large intestine without perforation or abscess without bleeding: Secondary | ICD-10-CM | POA: Diagnosis not present

## 2020-07-24 DIAGNOSIS — Z86010 Personal history of colonic polyps: Secondary | ICD-10-CM | POA: Insufficient documentation

## 2020-07-24 DIAGNOSIS — Z79899 Other long term (current) drug therapy: Secondary | ICD-10-CM | POA: Insufficient documentation

## 2020-07-24 DIAGNOSIS — K640 First degree hemorrhoids: Secondary | ICD-10-CM | POA: Diagnosis not present

## 2020-07-24 DIAGNOSIS — I2510 Atherosclerotic heart disease of native coronary artery without angina pectoris: Secondary | ICD-10-CM | POA: Diagnosis not present

## 2020-07-24 DIAGNOSIS — K635 Polyp of colon: Secondary | ICD-10-CM | POA: Diagnosis not present

## 2020-07-24 DIAGNOSIS — Z87891 Personal history of nicotine dependence: Secondary | ICD-10-CM | POA: Diagnosis not present

## 2020-07-24 DIAGNOSIS — I5032 Chronic diastolic (congestive) heart failure: Secondary | ICD-10-CM | POA: Diagnosis not present

## 2020-07-24 DIAGNOSIS — E669 Obesity, unspecified: Secondary | ICD-10-CM | POA: Diagnosis not present

## 2020-07-24 DIAGNOSIS — Z955 Presence of coronary angioplasty implant and graft: Secondary | ICD-10-CM | POA: Diagnosis not present

## 2020-07-24 DIAGNOSIS — K219 Gastro-esophageal reflux disease without esophagitis: Secondary | ICD-10-CM | POA: Insufficient documentation

## 2020-07-24 DIAGNOSIS — D124 Benign neoplasm of descending colon: Secondary | ICD-10-CM | POA: Insufficient documentation

## 2020-07-24 DIAGNOSIS — G4730 Sleep apnea, unspecified: Secondary | ICD-10-CM | POA: Insufficient documentation

## 2020-07-24 DIAGNOSIS — I509 Heart failure, unspecified: Secondary | ICD-10-CM | POA: Diagnosis not present

## 2020-07-24 DIAGNOSIS — E1122 Type 2 diabetes mellitus with diabetic chronic kidney disease: Secondary | ICD-10-CM | POA: Diagnosis not present

## 2020-07-24 DIAGNOSIS — Z7982 Long term (current) use of aspirin: Secondary | ICD-10-CM | POA: Diagnosis not present

## 2020-07-24 DIAGNOSIS — E7800 Pure hypercholesterolemia, unspecified: Secondary | ICD-10-CM | POA: Diagnosis not present

## 2020-07-24 DIAGNOSIS — N183 Chronic kidney disease, stage 3 unspecified: Secondary | ICD-10-CM | POA: Diagnosis not present

## 2020-07-24 DIAGNOSIS — D509 Iron deficiency anemia, unspecified: Secondary | ICD-10-CM | POA: Insufficient documentation

## 2020-07-24 HISTORY — PX: POLYPECTOMY: SHX5525

## 2020-07-24 HISTORY — PX: COLONOSCOPY WITH PROPOFOL: SHX5780

## 2020-07-24 LAB — GLUCOSE, CAPILLARY
Glucose-Capillary: 115 mg/dL — ABNORMAL HIGH (ref 70–99)
Glucose-Capillary: 97 mg/dL (ref 70–99)

## 2020-07-24 SURGERY — COLONOSCOPY WITH PROPOFOL
Anesthesia: General

## 2020-07-24 MED ORDER — CHLORHEXIDINE GLUCONATE CLOTH 2 % EX PADS: 6.0000 | MEDICATED_PAD | Freq: Once | CUTANEOUS | Status: DC

## 2020-07-24 MED ORDER — LACTATED RINGERS IV SOLN
INTRAVENOUS | Status: DC | PRN
Start: 2020-07-24 — End: 2020-07-24

## 2020-07-24 MED ORDER — STERILE WATER FOR IRRIGATION IR SOLN
Status: DC | PRN
  Administered 2020-07-24: 1.5 mL

## 2020-07-24 MED ORDER — EPHEDRINE SULFATE 50 MG/ML IJ SOLN
INTRAMUSCULAR | Status: DC | PRN
  Administered 2020-07-24: 5 mg via INTRAVENOUS
  Administered 2020-07-24: 10 mg via INTRAVENOUS

## 2020-07-24 MED ORDER — EPHEDRINE 5 MG/ML INJ
INTRAVENOUS | Status: AC
  Filled 2020-07-24: qty 10

## 2020-07-24 MED ORDER — PROPOFOL 10 MG/ML IV BOLUS
INTRAVENOUS | Status: DC | PRN
  Administered 2020-07-24: 30 mg via INTRAVENOUS
  Administered 2020-07-24: 40 mg via INTRAVENOUS

## 2020-07-24 MED ORDER — PROPOFOL 500 MG/50ML IV EMUL
INTRAVENOUS | Status: DC | PRN
  Administered 2020-07-24: 35 ug/kg/min via INTRAVENOUS

## 2020-07-24 MED ORDER — LACTATED RINGERS IV SOLN
Freq: Once | INTRAVENOUS | Status: AC
  Administered 2020-07-24: 1000 mL via INTRAVENOUS

## 2020-07-24 NOTE — Op Note (Signed)
[facility] Patient Name: [name] Procedure Date: 07/24/2020 10:17 AM MRN: [id number] Date of Birth: [birth year] Attending MD: Norvel Richards , MD CSN: [id number] Age: 74 Admit Type: Outpatient Procedure:                Colonoscopy Indications:              Iron deficiency anemia Providers:                Norvel Richards, MD, Charlsie Quest. Theda Sers RN, RN,                            Aram Candela Referring MD:              Medicines:                Propofol per Anesthesia Complications:            No immediate complications. Estimated Blood Loss:     Estimated blood loss was minimal. Procedure:                Pre-Anesthesia Assessment:                           - Prior to the procedure, a History and Physical                            was performed, and patient medications and                            allergies were reviewed. The patient's tolerance of                            previous anesthesia was also reviewed. The risks                            and benefits of the procedure and the sedation                            options and risks were discussed with the patient.                            All questions were answered, and informed consent                            was obtained. Prior Anticoagulants: The patient has                            taken no previous anticoagulant or antiplatelet                            agents. ASA Grade Assessment: II - A patient with                            mild systemic disease. After reviewing the risks  and benefits, the patient was deemed in                            satisfactory condition to undergo the procedure.                           After obtaining informed consent, the colonoscope                            was passed under direct vision. Throughout the                            procedure, the patient's blood pressure, pulse, and                            oxygen saturations were  monitored continuously. The                            CF-[id number] ([id number]) scope was introduced through                            the anus and advanced to the the cecum, identified                            by appendiceal orifice and ileocecal valve. The                            colonoscopy was performed without difficulty. The                            patient tolerated the procedure well. The quality                            of the bowel preparation was adequate. Scope In: 10:45:24 AM Scope Out: 10:59:45 AM Scope Withdrawal Time: 0 hours 9 minutes 9 seconds  Total Procedure Duration: 0 hours 14 minutes 21 seconds  Findings:      The perianal and digital rectal examinations were normal.      A 6 mm polyp was found in the descending colon. The polyp was sessile.       The polyp was removed with a cold snare. Resection and retrieval were       complete. Estimated blood loss was minimal.      Non-bleeding internal hemorrhoids were found during retroflexion. The       hemorrhoids were moderate, medium-sized and Grade I (internal       hemorrhoids that do not prolapse). Yellowish 8 mm submucosal       rectosigmoid lesion. Positive pillow sign consistent with a lipoma. The       remainder the colonic mucosa appeared normal.      Scattered medium-mouthed diverticula were found in the entire colon. Impression:               - One 6 mm polyp in the descending colon, removed  with a cold snare. Resected and retrieved.                           - Non-bleeding internal hemorrhoids. Rectal lipoma.                            Nothing found on today's examination to explain                            iron deficiency anemia from a GI standpoint. Moderate Sedation:      Moderate (conscious) sedation was personally administered by an       anesthesia professional. The following parameters were monitored: oxygen       saturation, heart rate, blood pressure, respiratory  rate, EKG, adequacy       of pulmonary ventilation, and response to care. Recommendation:           - Patient has a contact number available for                            emergencies. The signs and symptoms of potential                            delayed complications were discussed with the                            patient. Return to normal activities tomorrow.                            Written discharge instructions were provided to the                            patient.                           - Advance diet as tolerated. Follow-up on                            pathology. Further recommendations to follow. Procedure Code(s):        --- Professional ---                           [phone], Colonoscopy, flexible; with removal of                            tumor(s), polyp(s), or other lesion(s) by snare                            technique Diagnosis Code(s):        --- Professional ---                           K63.5, Polyp of colon                           K64.0, First degree hemorrhoids  D50.9, Iron deficiency anemia, unspecified CPT copyright 2019 American Medical Association. All rights reserved. The codes documented in this report are preliminary and upon coder review may  be revised to meet current compliance requirements. Cristopher Estimable. Aryaan Persichetti, MD Norvel Richards, MD 07/24/2020 11:13:49 AM This report has been signed electronically. Number of Addenda: 0

## 2020-07-24 NOTE — H&P (Signed)
@LOGO @   Primary Care Physician:  Redmond School, MD Primary Gastroenterologist:  Dr. Gala Romney  Pre-Procedure History & Physical: HPI:  [name] is a 74 y.o. male here for diagnostic colonoscopy given history of iron deficiency anemia.  Small colonic adenoma removed 6 years ago.  No GI symptoms currently.  Past Medical History:  Diagnosis Date  . Anxiety   . Benign prostatic hypertrophy    Nocturia  . CAD (coronary artery disease)    a.  NSTEMI 10/2012 s/p DES to LAD & DES to 1st diagonal with residual diffuse nonobstructive dz in LCx/RCA   . Chronic kidney disease, stage 3, mod decreased GFR    Creatinine of 1.5 in 03/2009, STAGE 4 now  . Degenerative joint disease   . Depression    hx of  . Diabetes mellitus, type II ([place]) 12/10/2012.  . Diastolic heart failure    Pedal edema, "a long time ago"  . Erectile dysfunction   . Flu 2013   hx of  . GERD (gastroesophageal reflux disease)   . Gout   . Hemorrhoids   . High cholesterol   . Hypertension    Exercise induced  . Myocardial infarction ([place])   . NSVT (nonsustained ventricular tachycardia) ([facility])    Exercise induced  . Obesity   . 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 Tubular adenoma 2014    Past Surgical History:  Procedure Laterality Date  . BACK SURGERY  2007   x2  . CARDIAC CATHETERIZATION     with stent placement  . COLONOSCOPY WITH ESOPHAGOGASTRODUODENOSCOPY (EGD) N/A 11/23/2013   Dr. Oneida Alar- TCS= left sided colitis and mild proctitis likely due to ischemic colitis, moderate internal hemorrhoids, small nodule in the rectum, bx=tubular adenoma, EGD=-mild non-erosive gastritis  . HERNIA REPAIR    . LEFT HEART CATHETERIZATION WITH CORONARY ANGIOGRAM N/A 11/16/2012   Procedure: LEFT HEART CATHETERIZATION WITH CORONARY ANGIOGRAM;  Surgeon: Sherren Mocha, MD;  Location: [facility] C[facility] LAB;  Service: Cardiovascular;  Laterality: N/A;  . PERCUTANEOUS CORONARY STENT INTERVENTION (PCI-S)   11/16/2012   Procedure: PERCUTANEOUS CORONARY STENT INTERVENTION (PCI-S);  Surgeon: Sherren Mocha, MD;  Location: [facility] CATH LAB;  Service: Cardiovascular;;  . TOTAL KNEE ARTHROPLASTY Right 11/15/2013   Procedure: RIGHT TOTAL KNEE ARTHROPLASTY;  Surgeon: Mauri Pole, MD;  Location: [facility] ORS;  Service: Orthopedics;  Laterality: Right;  . TOTAL KNEE ARTHROPLASTY Left 03/07/2015   Procedure: LEFT TOTAL KNEE ARTHROPLASTY;  Surgeon: Paralee Cancel, MD;  Location: [facility] ORS;  Service: Orthopedics;  Laterality: Left;    Prior to Admission medications   Medication Sig Start Date End Date Taking? Authorizing Provider  acarbose (PRECOSE) 50 MG tablet Take 50 mg by mouth in the morning, at noon, and at bedtime.  10/20/17  Yes [provider]  acetaminophen (TYLENOL) 650 MG CR tablet Take 1,300 mg by mouth daily.   Yes [provider]  amLODipine (NORVASC) 10 MG tablet Take 10 mg by mouth every evening.    Yes [provider]  aspirin EC 81 MG tablet Take 1 tablet (81 mg total) by mouth daily. Patient taking differently: Take 81 mg by mouth every evening.  09/14/19  Yes Herminio Commons, MD  calcitRIOL (ROCALTROL) 0.25 MCG capsule Take 0.25 mcg by mouth every Sunday.  12/13/16  Yes [provider]  cholecalciferol (VITAMIN D) 1000 units tablet Take 1,000 Units daily by mouth.   Yes [provider]  cloNIDine (CATAPRES) 0.1 MG  tablet Take 0.1 mg by mouth 2 (two) times daily. 02/15/20  Yes [provider]  furosemide (LASIX) 40 MG tablet Take 1 tablet (40 mg total) by mouth 2 (two) times daily. 08/09/19  Yes Herminio Commons, MD  gabapentin (NEURONTIN) 300 MG capsule Take 300 mg by mouth 3 (three) times daily.  10/19/19  Yes [provider]  HYDROcodone-acetaminophen (NORCO) 10-325 MG tablet Take 1-2 tablets by mouth 4 (four) times daily as needed for moderate pain or severe pain. Patient taking differently: Take 1 tablet by mouth 3 (three) times  daily as needed for moderate pain or severe pain.  10/28/19  Yes Shah, Pratik D, DO  insulin NPH Human (NOVOLIN N) 100 UNIT/ML injection Inject 0.2 mLs (20 Units total) into the skin every morning. Ands syringes 1/day 12/29/19  Yes Renato Shin, MD  meclizine (ANTIVERT) 25 MG tablet Take 25 mg by mouth 3 (three) times daily as needed for dizziness.   Yes [provider]  omeprazole (PRILOSEC) 20 MG capsule Take 20 mg by mouth daily.  10/22/19  Yes [provider]  pravastatin (PRAVACHOL) 20 MG tablet Take 1 tablet (20 mg total) by mouth every evening. 09/14/19 07/12/21 Yes Herminio Commons, MD  allopurinol (ZYLOPRIM) 100 MG tablet Take 1 tablet (100 mg total) by mouth daily. Patient not taking: Reported on 07/12/2020 10/29/19 05/05/20  Manuella Ghazi, Pratik D, DO  nitroGLYCERIN (NITROSTAT) 0.4 MG SL tablet PLACE ONE TABLET UNDER THE TONGUE EVERY 5 MINUTES FOR 3 DOSES AS NEEDED Patient taking differently: Place 0.4 mg under the tongue every 5 (five) minutes x 3 doses as needed for chest pain.  07/22/17   Herminio Commons, MD    Allergies as of 06/21/2020 - Review Complete 06/12/2020  Allergen Reaction Noted  . Niacin and related Other (See Comments) 10/24/2012    Family History  Problem Relation Age of Onset  . Hypertension Other   . Cancer Other   . Heart disease Other   . Diabetes Other   . Hypertension Mother   . Cancer Father        brain  . CAD Brother   . Colon cancer Neg Hx     Social History   Socioeconomic History  . Marital status: Widowed    Spouse name: Not on file  . Number of children: Not on file  . Years of education: Not on file  . Highest education level: Not on file  Occupational History  . Occupation: Disabled    Employer: RETIRED  Tobacco Use  . Smoking status: Former Smoker    Packs/day: 1.00    Years: 30.00    Pack years: 30.00    Types: Cigarettes    Start date: 01/22/1965    Quit date: 12/23/1992    Years since quitting: 27.6  . Smokeless  tobacco: Never Used  Vaping Use  . Vaping Use: Never used  Substance and Sexual Activity  . Alcohol use: No    Alcohol/week: 0.0 standard drinks  . Drug use: No  . Sexual activity: Not Currently  Other Topics Concern  . Not on file  Social History Narrative   Married   Right handed   One story home no caffeine   Social Determinants of Health   Financial Resource Strain:   . Difficulty of Paying Living Expenses:   Food Insecurity:   . Worried About Charity fundraiser in the Last Year:   . [place] in the Last Year:  Transportation Needs:   . Film/video editor (Medical):   Marland Kitchen Lack of Transportation (Non-Medical):   Physical Activity:   . Days of Exercise per Week:   . Minutes of Exercise per Session:   Stress:   . Feeling of Stress :   Social Connections:   . Frequency of Communication with Friends and Family:   . Frequency of Social Gatherings with Friends and Family:   . Attends Religious Services:   . Active Member of Clubs or Organizations:   . Attends Archivist Meetings:   Marland Kitchen Marital Status:   Intimate Partner Violence:   . Fear of Current or Ex-Partner:   . Emotionally Abused:   Marland Kitchen Physically Abused:   . Sexually Abused:     Review of Systems: See HPI, otherwise negative ROS  Physical Exam: BP (!) 155/71   Pulse 52   Temp 98.3 F (36.8 C) (Oral)   Resp 18   SpO2 100%  General:   Alert,  Well-developed, well-nourished, pleasant and cooperative in NAD Mouth:  No deformity or lesions. Neck:  Supple; no masses or thyromegaly. No significant cervical adenopathy. Lungs:  Clear throughout to auscultation.   No wheezes, crackles, or rhonchi. No acute distress. Heart:  Regular rate and rhythm; no murmurs, clicks, rubs,  or gallops. Abdomen: Non-distended, normal bowel sounds.  Soft and nontender without appreciable mass or hepatosplenomegaly.  Pulses:  Normal pulses noted. Extremities:  Without clubbing or edema.  Impression/Plan:  74 year old gentleman with a history of iron deficiency anemia.  Distant history colonic adenoma removed.  He is devoid of any GI symptoms.  Here for diagnostic colonoscopy per plan.  The risks, benefits, limitations, alternatives and imponderables have been reviewed with the patient. Questions have been answered. All parties are agreeable.      Notice: This dictation was prepared with Dragon dictation along with smaller phrase technology. Any transcriptional errors that result from this process are unintentional and may not be corrected upon review.

## 2020-07-24 NOTE — Discharge Instructions (Signed)
Colonoscopy Discharge Instructions  Read the instructions outlined below and refer to this sheet in the next few weeks. These discharge instructions provide you with general information on caring for yourself after you leave the hospital. Your doctor may also give you specific instructions. While your treatment has been planned according to the most current medical practices available, unavoidable complications occasionally occur. If you have any problems or questions after discharge, call Dr. Gala Romney at [phone]. ACTIVITY  You may resume your regular activity, but move at a slower pace for the next 24 hours.   Take frequent rest periods for the next 24 hours.   Walking will help get rid of the air and reduce the bloated feeling in your belly (abdomen).   No driving for 24 hours (because of the medicine (anesthesia) used during the test).    Do not sign any important legal documents or operate any machinery for 24 hours (because of the anesthesia used during the test).  NUTRITION  Drink plenty of fluids.   You may resume your normal diet as instructed by your doctor.   Begin with a light meal and progress to your normal diet. Heavy or fried foods are harder to digest and may make you feel sick to your stomach (nauseated).   Avoid alcoholic beverages for 24 hours or as instructed.  MEDICATIONS  You may resume your normal medications unless your doctor tells you otherwise.  WHAT YOU CAN EXPECT TODAY  Some feelings of bloating in the abdomen.   Passage of more gas than usual.   Spotting of blood in your stool or on the toilet paper.  IF YOU HAD POLYPS REMOVED DURING THE COLONOSCOPY:  No aspirin products for 7 days or as instructed.   No alcohol for 7 days or as instructed.   Eat a soft diet for the next 24 hours.  FINDING OUT THE RESULTS OF YOUR TEST Not all test results are available during your visit. If your test results are not back during the visit, make an appointment  with your caregiver to find out the results. Do not assume everything is normal if you have not heard from your caregiver or the medical facility. It is important for you to follow up on all of your test results.  SEEK IMMEDIATE MEDICAL ATTENTION IF:  You have more than a spotting of blood in your stool.   Your belly is swollen (abdominal distention).   You are nauseated or vomiting.   You have a temperature over 101.   You have abdominal pain or discomfort that is severe or gets worse throughout the day.   Diverticulosis and colon polyp information provided  Further recommendations to follow pending review of pathology report  At patient request, I called Nash Dimmer at [phone] -reviewed results.     Diverticulosis  Diverticulosis is a condition that develops when small pouches (diverticula) form in the wall of the large intestine (colon). The colon is where water is absorbed and stool (feces) is formed. The pouches form when the inside layer of the colon pushes through weak spots in the outer layers of the colon. You may have a few pouches or many of them. The pouches usually do not cause problems unless they become inflamed or infected. When this happens, the condition is called diverticulitis. What are the causes? The cause of this condition is not known. What increases the risk? The following factors may make you more likely to develop this condition:  Being older than age 24.  Your risk for this condition increases with age. Diverticulosis is rare among people younger than age 79. By age 23, many people have it.  Eating a low-fiber diet.  Having frequent constipation.  Being overweight.  Not getting enough exercise.  Smoking.  Taking over-the-counter pain medicines, like aspirin and ibuprofen.  Having a family history of diverticulosis. What are the signs or symptoms? In most people, there are no symptoms of this condition. If you do have symptoms, they may  include:  Bloating.  Cramps in the abdomen.  Constipation or diarrhea.  Pain in the lower left side of the abdomen. How is this diagnosed? Because diverticulosis usually has no symptoms, it is most often diagnosed during an exam for other colon problems. The condition may be diagnosed by:  Using a flexible scope to examine the colon (colonoscopy).  Taking an X-ray of the colon after dye has been put into the colon (barium enema).  Having a CT scan. How is this treated? You may not need treatment for this condition. Your health care provider may recommend treatment to prevent problems. You may need treatment if you have symptoms or if you previously had diverticulitis. Treatment may include:  Eating a high-fiber diet.  Taking a fiber supplement.  Taking a live bacteria supplement (probiotic).  Taking medicine to relax your colon. Follow these instructions at home: Medicines  Take over-the-counter and prescription medicines only as told by your health care provider.  If told by your health care provider, take a fiber supplement or probiotic. Constipation prevention Your condition may cause constipation. To prevent or treat constipation, you may need to:  Drink enough fluid to keep your urine pale yellow.  Take over-the-counter or prescription medicines.  Eat foods that are high in fiber, such as beans, whole grains, and fresh fruits and vegetables.  Limit foods that are high in fat and processed sugars, such as fried or sweet foods.  General instructions  Try not to strain when you have a bowel movement.  Keep all follow-up visits as told by your health care provider. This is important. Contact a health care provider if you:  Have pain in your abdomen.  Have bloating.  Have cramps.  Have not had a bowel movement in 3 days. Get help right away if:  Your pain gets worse.  Your bloating becomes very bad.  You have a fever or chills, and your symptoms  suddenly get worse.  You vomit.  You have bowel movements that are bloody or black.  You have bleeding from your rectum. Summary  Diverticulosis is a condition that develops when small pouches (diverticula) form in the wall of the large intestine (colon).  You may have a few pouches or many of them.  This condition is most often diagnosed during an exam for other colon problems.  Treatment may include increasing the fiber in your diet, taking supplements, or taking medicines. This information is not intended to replace advice given to you by your health care provider. Make sure you discuss any questions you have with your health care provider. Document Revised: 07/08/2019 Document Reviewed: 07/08/2019 Elsevier Patient Education  [place].     Colon Polyps  Polyps are tissue growths inside the body. Polyps can grow in many places, including the large intestine (colon). A polyp may be a round bump or a mushroom-shaped growth. You could have one polyp or several. Most colon polyps are noncancerous (benign). However, some colon polyps can become cancerous over time. Finding and  removing the polyps early can help prevent this. What are the causes? The exact cause of colon polyps is not known. What increases the risk? You are more likely to develop this condition if you:  Have a family history of colon cancer or colon polyps.  Are older than 19 or older than 45 if you are African American.  Have inflammatory bowel disease, such as ulcerative colitis or Crohn's disease.  Have certain hereditary conditions, such as: ? Familial adenomatous polyposis. ? Lynch syndrome. ? Turcot syndrome. ? Peutz-Jeghers syndrome.  Are overweight.  Smoke cigarettes.  Do not get enough exercise.  Drink too much alcohol.  Eat a diet that is high in fat and red meat and low in fiber.  Had childhood cancer that was treated with abdominal radiation. What are the signs or  symptoms? Most polyps do not cause symptoms. If you have symptoms, they may include:  Blood coming from your rectum when having a bowel movement.  Blood in your stool. The stool may look dark red or black.  Abdominal pain.  A change in bowel habits, such as constipation or diarrhea. How is this diagnosed? This condition is diagnosed with a colonoscopy. This is a procedure in which a lighted, flexible scope is inserted into the anus and then passed into the colon to examine the area. Polyps are sometimes found when a colonoscopy is done as part of routine cancer screening tests. How is this treated? Treatment for this condition involves removing any polyps that are found. Most polyps can be removed during a colonoscopy. Those polyps will then be tested for cancer. Additional treatment may be needed depending on the results of testing. Follow these instructions at home: Lifestyle  Maintain a healthy weight, or lose weight if recommended by your health care provider.  Exercise every day or as told by your health care provider.  Do not use any products that contain nicotine or tobacco, such as cigarettes and e-cigarettes. If you need help quitting, ask your health care provider.  If you drink alcohol, limit how much you have: ? 0-1 drink a day for women. ? 0-2 drinks a day for men.  Be aware of how much alcohol is in your drink. In the [place] one drink equals one 12 oz bottle of beer (355 mL), one 5 oz glass of wine (148 mL), or one 1 oz shot of hard liquor (44 mL). Eating and drinking   Eat foods that are high in fiber, such as fruits, vegetables, and whole grains.  Eat foods that are high in calcium and vitamin D, such as milk, cheese, yogurt, eggs, liver, fish, and broccoli.  Limit foods that are high in fat, such as fried foods and desserts.  Limit the amount of red meat and processed meat you eat, such as hot dogs, sausage, bacon, and lunch meats. General instructions  Keep  all follow-up visits as told by your health care provider. This is important. ? This includes having regularly scheduled colonoscopies. ? Talk to your health care provider about when you need a colonoscopy. Contact a health care provider if:  You have new or worsening bleeding during a bowel movement.  You have new or increased blood in your stool.  You have a change in bowel habits.  You lose weight for no known reason. Summary  Polyps are tissue growths inside the body. Polyps can grow in many places, including the colon.  Most colon polyps are noncancerous (benign), but some can become cancerous over  time.  This condition is diagnosed with a colonoscopy.  Treatment for this condition involves removing any polyps that are found. Most polyps can be removed during a colonoscopy. This information is not intended to replace advice given to you by your health care provider. Make sure you discuss any questions you have with your health care provider. Document Revised: 03/26/2018 Document Reviewed: 03/26/2018 Elsevier Patient Education  [place] These instructions provide you with information about caring for yourself after your procedure. Your health care provider may also give you more specific instructions. Your treatment has been planned according to current medical practices, but problems sometimes occur. Call your health care provider if you have any problems or questions after your procedure. What can I expect after the procedure? After your procedure, you may:  Feel sleepy for several hours.  Feel clumsy and have poor balance for several hours.  Feel forgetful about what happened after the procedure.  Have poor judgment for several hours.  Feel nauseous or vomit.  Have a sore throat if you had a breathing tube during the procedure. Follow these instructions at home: For at least 24 hours after the procedure:       Have a responsible adult stay with you. It is important to have someone help care for you until you are awake and alert.  Rest as needed.  Do not: ? Participate in activities in which you could fall or become injured. ? Drive. ? Use heavy machinery. ? Drink alcohol. ? Take sleeping pills or medicines that cause drowsiness. ? Make important decisions or sign legal documents. ? Take care of children on your own. Eating and drinking  Follow the diet that is recommended by your health care provider.  If you vomit, drink water, juice, or soup when you can drink without vomiting.  Make sure you have little or no nausea before eating solid foods. General instructions  Take over-the-counter and prescription medicines only as told by your health care provider.  If you have sleep apnea, surgery and certain medicines can increase your risk for breathing problems. Follow instructions from your health care provider about wearing your sleep device: ? Anytime you are sleeping, including during daytime naps. ? While taking prescription pain medicines, sleeping medicines, or medicines that make you drowsy.  If you smoke, do not smoke without supervision.  Keep all follow-up visits as told by your health care provider. This is important. Contact a health care provider if:  You keep feeling nauseous or you keep vomiting.  You feel light-headed.  You develop a rash.  You have a fever. Get help right away if:  You have trouble breathing. Summary  For several hours after your procedure, you may feel sleepy and have poor judgment.  Have a responsible adult stay with you for at least 24 hours or until you are awake and alert. This information is not intended to replace advice given to you by your health care provider. Make sure you discuss any questions you have with your health care provider. Document Revised: 03/09/2018 Document Reviewed: 03/31/2016 Elsevier Patient Education  [place].

## 2020-07-24 NOTE — Anesthesia Postprocedure Evaluation (Signed)
Anesthesia Post Note  Patient: [name]  Procedure(s) Performed: COLONOSCOPY WITH PROPOFOL (N/A ) POLYPECTOMY  Patient location during evaluation: PACU Anesthesia Type: General Level of consciousness: awake and alert and patient cooperative Pain management: satisfactory to patient Vital Signs Assessment: post-procedure vital signs reviewed and stable Respiratory status: spontaneous breathing Cardiovascular status: stable Postop Assessment: no apparent nausea or vomiting Anesthetic complications: no   No complications documented.   Last Vitals:  Vitals:   07/24/20 0936 07/24/20 1105  BP: (!) 155/71 (!) 128/47  Pulse: 52 65  Resp: 18 17  Temp: 36.8 C (!) 36.4 C  SpO2: 100% 95%    Last Pain:  Vitals:   07/24/20 1105  TempSrc:   PainSc: Asleep                 [name]

## 2020-07-24 NOTE — Anesthesia Procedure Notes (Signed)
Procedure Name: MAC Date/Time: 07/24/2020 10:36 AM Performed by: Vista Deck, CRNA Pre-anesthesia Checklist: Patient identified, Emergency Drugs available, Suction available, Timeout performed and Patient being monitored Patient Re-evaluated:Patient Re-evaluated prior to induction Oxygen Delivery Method: Non-rebreather mask

## 2020-07-24 NOTE — Anesthesia Preprocedure Evaluation (Signed)
Anesthesia Evaluation  Patient identified by MRN, date of birth, ID band Patient awake    Reviewed: Allergy & Precautions, NPO status , Patient's Chart, lab work & pertinent test results  History of Anesthesia Complications Negative for: history of anesthetic complications  Airway Mallampati: III  TM Distance: >3 FB Neck ROM: Full    Dental  (+) Missing, Dental Advisory Given, Partial Upper, Partial Lower   Pulmonary sleep apnea , former smoker,    Pulmonary exam normal breath sounds clear to auscultation       Cardiovascular Exercise Tolerance: Good hypertension, Pt. on medications + CAD, + Past MI, + Cardiac Stents and +CHF  + dysrhythmias (V Tach)  Rhythm:Irregular Rate:Bradycardia  1. Left ventricular ejection fraction, by estimation, is 55 to 60%. The  left ventricle has normal function. The left ventricle has no regional  wall motion abnormalities. There is mild left ventricular hypertrophy.  Left ventricular diastolic parameters  are consistent with Grade I diastolic dysfunction (impaired relaxation).  2. Right ventricular systolic function is normal. The right ventricular  size is normal.  3. Left atrial size was mildly dilated.  4. The mitral valve is normal in structure. No evidence of mitral valve  regurgitation. No evidence of mitral stenosis.  5. The aortic valve is tricuspid. Aortic valve regurgitation is not  visualized. No aortic stenosis is present.    Neuro/Psych PSYCHIATRIC DISORDERS Anxiety Depression    GI/Hepatic GERD  Medicated,  Endo/Other  diabetes, Well Controlled, Type 2, Oral Hypoglycemic Agents  Renal/GU Renal InsufficiencyRenal disease     Musculoskeletal  (+) Arthritis , Osteoarthritis,    Abdominal   Peds  Hematology  (+) anemia ,   Anesthesia Other Findings 20-Jul-2020 14:05:33 [facility]-OPS ROUTINE RECORD Sinus rhythm with 1st degree A-V block Abnormal  ECG Premature ventricular complexes RESOLVED SINCE PREVIOUS Confirmed by Kirk Ruths [phone]) on 07/20/2020 2:43:55 PM  Reproductive/Obstetrics                            Anesthesia Physical Anesthesia Plan  ASA: III  Anesthesia Plan: General   Post-op Pain Management:    Induction: Intravenous  PONV Risk Score and Plan: TIVA  Airway Management Planned: Nasal Cannula, Natural Airway and Simple Face Mask  Additional Equipment:   Intra-op Plan:   Post-operative Plan:   Informed Consent: I have reviewed the patients History and Physical, chart, labs and discussed the procedure including the risks, benefits and alternatives for the proposed anesthesia with the patient or authorized representative who has indicated his/her understanding and acceptance.     Dental advisory given  Plan Discussed with: CRNA and Surgeon  Anesthesia Plan Comments: (Risk of renal function getting worse after anesthesia was explained)        Anesthesia Quick Evaluation

## 2020-07-24 NOTE — Transfer of Care (Signed)
Immediate Anesthesia Transfer of Care Note  Patient: [name]  Procedure(s) Performed: COLONOSCOPY WITH PROPOFOL (N/A ) POLYPECTOMY  Patient Location: PACU  Anesthesia Type:General  Level of Consciousness: awake, alert  and patient cooperative  Airway & Oxygen Therapy: Patient Spontanous Breathing  Post-op Assessment: Report given to RN and Post -op Vital signs reviewed and stable  Post vital signs: Reviewed and stable  Last Vitals:  Vitals Value Taken Time  BP 128/47   Temp 97.5   Pulse 50 07/24/20 1106  Resp 17 07/24/20 1106  SpO2 100 % 07/24/20 1106  Vitals shown include unvalidated device data.  Last Pain:  Vitals:   07/24/20 1030  TempSrc:   PainSc: 0-No pain      Patients Stated Pain Goal: 6 ([id number]/91 [id number])  Complications: No complications documented.

## 2020-07-25 ENCOUNTER — Encounter: Payer: Self-pay | Admitting: Internal Medicine

## 2020-07-25 LAB — SURGICAL PATHOLOGY

## 2020-07-27 ENCOUNTER — Encounter (HOSPITAL_COMMUNITY): Payer: Self-pay | Admitting: Internal Medicine

## 2020-07-28 DIAGNOSIS — M5410 Radiculopathy, site unspecified: Secondary | ICD-10-CM | POA: Diagnosis not present

## 2020-07-28 DIAGNOSIS — G894 Chronic pain syndrome: Secondary | ICD-10-CM | POA: Diagnosis not present

## 2020-07-28 DIAGNOSIS — M5416 Radiculopathy, lumbar region: Secondary | ICD-10-CM | POA: Diagnosis not present

## 2020-08-11 DIAGNOSIS — E1122 Type 2 diabetes mellitus with diabetic chronic kidney disease: Secondary | ICD-10-CM | POA: Diagnosis not present

## 2020-08-11 DIAGNOSIS — R809 Proteinuria, unspecified: Secondary | ICD-10-CM | POA: Diagnosis not present

## 2020-08-11 DIAGNOSIS — E1129 Type 2 diabetes mellitus with other diabetic kidney complication: Secondary | ICD-10-CM | POA: Diagnosis not present

## 2020-08-11 DIAGNOSIS — E211 Secondary hyperparathyroidism, not elsewhere classified: Secondary | ICD-10-CM | POA: Diagnosis not present

## 2020-08-11 DIAGNOSIS — N189 Chronic kidney disease, unspecified: Secondary | ICD-10-CM | POA: Diagnosis not present

## 2020-08-17 DIAGNOSIS — M109 Gout, unspecified: Secondary | ICD-10-CM | POA: Diagnosis not present

## 2020-08-18 DIAGNOSIS — R809 Proteinuria, unspecified: Secondary | ICD-10-CM | POA: Diagnosis not present

## 2020-08-18 DIAGNOSIS — E1122 Type 2 diabetes mellitus with diabetic chronic kidney disease: Secondary | ICD-10-CM | POA: Diagnosis not present

## 2020-08-18 DIAGNOSIS — I5032 Chronic diastolic (congestive) heart failure: Secondary | ICD-10-CM | POA: Diagnosis not present

## 2020-08-18 DIAGNOSIS — N189 Chronic kidney disease, unspecified: Secondary | ICD-10-CM | POA: Diagnosis not present

## 2020-08-18 DIAGNOSIS — I129 Hypertensive chronic kidney disease with stage 1 through stage 4 chronic kidney disease, or unspecified chronic kidney disease: Secondary | ICD-10-CM | POA: Diagnosis not present

## 2020-08-19 ENCOUNTER — Other Ambulatory Visit: Payer: Self-pay | Admitting: Endocrinology

## 2020-08-31 DIAGNOSIS — H6120 Impacted cerumen, unspecified ear: Secondary | ICD-10-CM | POA: Diagnosis not present

## 2020-09-11 ENCOUNTER — Ambulatory Visit: Payer: Medicare Other | Admitting: Endocrinology

## 2020-09-12 DIAGNOSIS — M79675 Pain in left toe(s): Secondary | ICD-10-CM | POA: Diagnosis not present

## 2020-09-12 DIAGNOSIS — S91212A Laceration without foreign body of left great toe with damage to nail, initial encounter: Secondary | ICD-10-CM | POA: Diagnosis not present

## 2020-09-12 DIAGNOSIS — E1140 Type 2 diabetes mellitus with diabetic neuropathy, unspecified: Secondary | ICD-10-CM | POA: Diagnosis not present

## 2020-09-12 DIAGNOSIS — M79672 Pain in left foot: Secondary | ICD-10-CM | POA: Diagnosis not present

## 2020-09-14 DIAGNOSIS — M25552 Pain in left hip: Secondary | ICD-10-CM | POA: Diagnosis not present

## 2020-09-14 DIAGNOSIS — R42 Dizziness and giddiness: Secondary | ICD-10-CM | POA: Diagnosis not present

## 2020-09-14 DIAGNOSIS — Z23 Encounter for immunization: Secondary | ICD-10-CM | POA: Diagnosis not present

## 2020-09-14 DIAGNOSIS — G894 Chronic pain syndrome: Secondary | ICD-10-CM | POA: Diagnosis not present

## 2020-09-18 ENCOUNTER — Encounter: Payer: Self-pay | Admitting: Physician Assistant

## 2020-09-18 ENCOUNTER — Ambulatory Visit: Payer: Medicare Other | Admitting: Cardiovascular Disease

## 2020-09-18 ENCOUNTER — Other Ambulatory Visit: Payer: Self-pay

## 2020-09-18 ENCOUNTER — Ambulatory Visit: Payer: Medicare Other | Admitting: Physician Assistant

## 2020-09-18 VITALS — BP 128/62 | HR 80 | Ht 74.0 in | Wt 273.0 lb

## 2020-09-18 DIAGNOSIS — N183 Chronic kidney disease, stage 3 unspecified: Secondary | ICD-10-CM

## 2020-09-18 DIAGNOSIS — I2510 Atherosclerotic heart disease of native coronary artery without angina pectoris: Secondary | ICD-10-CM | POA: Diagnosis not present

## 2020-09-18 DIAGNOSIS — I10 Essential (primary) hypertension: Secondary | ICD-10-CM

## 2020-09-18 DIAGNOSIS — Z6835 Body mass index (BMI) 35.0-35.9, adult: Secondary | ICD-10-CM

## 2020-09-18 DIAGNOSIS — I5032 Chronic diastolic (congestive) heart failure: Secondary | ICD-10-CM

## 2020-09-18 NOTE — Progress Notes (Signed)
Cardiology Office Note    Date:  09/18/2020   ID:  [name], DOB February 15, 1946, MRN [id number]  PCP:  Redmond School, MD  Cardiologist: No primary care provider on file.  To be established, [place] EPS: None  Chief Complaint  Patient presents with  . Follow-up    History of Present Illness:  [name] is a 74 y.o. male with history of CAD status post NSTEMI 2013 treated with DES to the LAD and diagonal 1 with residual nonobstructive disease in the RCA and circumflex treated medically, hypertension, HLD, chronic diastolic CHF, CKD stage III.  Metoprolol stopped due to bradycardia  Patient last seen in our office 02/2020 and was doing well.  Patient comes in for f/u. He says he started eating right. His kidney function has improved and no more fluid. Lost 35-40 lbs from changing his diet. He is doing cardio 3 times/week 30 min. Denies chest pain, shortness of breath, dizziness or swelling.  Past Medical History:  Diagnosis Date  . Anxiety   . Benign prostatic hypertrophy    Nocturia  . CAD (coronary artery disease)    a.  NSTEMI 10/2012 s/p DES to LAD & DES to 1st diagonal with residual diffuse nonobstructive dz in LCx/RCA   . Chronic kidney disease, stage 3, mod decreased GFR    Creatinine of 1.5 in 03/2009, STAGE 4 now  . Degenerative joint disease   . Depression    hx of  . Diabetes mellitus, type II ([place]) 12/10/2012.  . Diastolic heart failure    Pedal edema, "a long time ago"  . Erectile dysfunction   . Flu 2013   hx of  . GERD (gastroesophageal reflux disease)   . Gout   . Hemorrhoids   . High cholesterol   . Hypertension    Exercise induced  . Myocardial infarction ([place])   . NSVT (nonsustained ventricular tachycardia) (HCC)    Exercise induced  . Obesity   . 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 Tubular adenoma 2014    Past Surgical History:  Procedure Laterality Date  . BACK SURGERY  2007   x2  . CARDIAC  CATHETERIZATION     with stent placement  . COLONOSCOPY WITH ESOPHAGOGASTRODUODENOSCOPY (EGD) N/A 11/23/2013   Dr. Oneida Alar- TCS= left sided colitis and mild proctitis likely due to ischemic colitis, moderate internal hemorrhoids, small nodule in the rectum, bx=tubular adenoma, EGD=-mild non-erosive gastritis  . COLONOSCOPY WITH PROPOFOL N/A 07/24/2020   Procedure: COLONOSCOPY WITH PROPOFOL;  Surgeon: Daneil Dolin, MD;  [facility];  Service: Endoscopy;  Laterality: N/A;  10:15am  . HERNIA REPAIR    . LEFT HEART CATHETERIZATION WITH CORONARY ANGIOGRAM N/A 11/16/2012   Procedure: LEFT HEART CATHETERIZATION WITH CORONARY ANGIOGRAM;  Surgeon: Sherren Mocha, MD;  Location: [facility] CATH LAB;  Service: Cardiovascular;  Laterality: N/A;  . PERCUTANEOUS CORONARY STENT INTERVENTION (PCI-S)  11/16/2012   Procedure: PERCUTANEOUS CORONARY STENT INTERVENTION (PCI-S);  Surgeon: Sherren Mocha, MD;  Location: [facility] CATH LAB;  Service: Cardiovascular;;  . POLYPECTOMY  07/24/2020   Procedure: POLYPECTOMY;  Surgeon: Daneil Dolin, MD;  [facility];  Service: Endoscopy;;  . TOTAL KNEE ARTHROPLASTY Right 11/15/2013   Procedure: RIGHT TOTAL KNEE ARTHROPLASTY;  Surgeon: Mauri Pole, MD;  Location: [facility] ORS;  Service: Orthopedics;  Laterality: Right;  . TOTAL KNEE ARTHROPLASTY Left 03/07/2015   Procedure: LEFT TOTAL KNEE ARTHROPLASTY;  Surgeon: Paralee Cancel, MD;  Location: [facility];  Service: Orthopedics;  Laterality: Left;    Current Medications: Current Meds  Medication Sig  . acarbose (PRECOSE) 50 MG tablet Take 50 mg by mouth in the morning, at noon, and at bedtime.   Marland Kitchen acetaminophen (TYLENOL) 650 MG CR tablet Take 1,300 mg by mouth daily.  Marland Kitchen allopurinol (ZYLOPRIM) 100 MG tablet Take 100 mg by mouth daily.  Marland Kitchen amLODipine (NORVASC) 10 MG tablet Take 10 mg by mouth every evening.   Marland Kitchen aspirin EC 81 MG tablet Take 1 tablet (81 mg total) by mouth daily.  . calcitRIOL (ROCALTROL) 0.25 MCG capsule Take  0.25 mcg by mouth every Sunday.   . cholecalciferol (VITAMIN D) 1000 units tablet Take 1,000 Units daily by mouth.  . cloNIDine (CATAPRES) 0.1 MG tablet Take 0.1 mg by mouth 2 (two) times daily.  . colchicine 0.6 MG tablet Take 0.6 mg by mouth daily.   . furosemide (LASIX) 40 MG tablet Take 1 tablet (40 mg total) by mouth 2 (two) times daily.  Marland Kitchen gabapentin (NEURONTIN) 300 MG capsule Take 300 mg by mouth 3 (three) times daily.   Marland Kitchen HYDROcodone-acetaminophen (NORCO) 10-325 MG tablet Take 1-2 tablets by mouth 4 (four) times daily as needed for moderate pain or severe pain.  Marland Kitchen insulin NPH Human (NOVOLIN N) 100 UNIT/ML injection Inject 0.2 mLs (20 Units total) into the skin every morning. Ands syringes 1/day  . meclizine (ANTIVERT) 25 MG tablet Take 25 mg by mouth 3 (three) times daily as needed for dizziness.  . nitroGLYCERIN (NITROSTAT) 0.4 MG SL tablet PLACE ONE TABLET UNDER THE TONGUE EVERY 5 MINUTES FOR 3 DOSES AS NEEDED  . omeprazole (PRILOSEC) 20 MG capsule Take 20 mg by mouth daily.   . pravastatin (PRAVACHOL) 20 MG tablet Take 1 tablet (20 mg total) by mouth every evening.  . TRUE METRIX BLOOD GLUCOSE TEST test strip USE TO TEST BLOOD SUGAR UP TO TWICE DAILY OR AS DIRECTED     Allergies:   Niacin and related   Social History   Socioeconomic History  . Marital status: Widowed    Spouse name: Not on file  . Number of children: Not on file  . Years of education: Not on file  . Highest education level: Not on file  Occupational History  . Occupation: Disabled    Employer: RETIRED  Tobacco Use  . Smoking status: Former Smoker    Packs/day: 1.00    Years: 30.00    Pack years: 30.00    Types: Cigarettes    Start date: 01/22/1965    Quit date: 12/23/1992    Years since quitting: 27.7  . Smokeless tobacco: Never Used  Vaping Use  . Vaping Use: Never used  Substance and Sexual Activity  . Alcohol use: No    Alcohol/week: 0.0 standard drinks  . Drug use: No  . Sexual activity: Not  Currently  Other Topics Concern  . Not on file  Social History Narrative   Married   Right handed   One story home no caffeine   Social Determinants of Health   Financial Resource Strain:   . Difficulty of Paying Living Expenses: Not on file  Food Insecurity:   . Worried About Charity fundraiser in the Last Year: Not on file  . Ran Out of Food in the Last Year: Not on file  Transportation Needs:   . Lack of Transportation (Medical): Not on file  . Lack of Transportation (Non-Medical): Not on file  Physical  Activity:   . Days of Exercise per Week: Not on file  . Minutes of Exercise per Session: Not on file  Stress:   . Feeling of Stress : Not on file  Social Connections:   . Frequency of Communication with Friends and Family: Not on file  . Frequency of Social Gatherings with Friends and Family: Not on file  . Attends Religious Services: Not on file  . Active Member of Clubs or Organizations: Not on file  . Attends Archivist Meetings: Not on file  . Marital Status: Not on file     Family History:  The patient's family history includes CAD in his brother; Cancer in his father and another family member; Diabetes in an other family member; Heart disease in an other family member; Hypertension in his mother and another family member.   ROS:   Please see the history of present illness.    ROS All other systems reviewed and are negative.   PHYSICAL EXAM:   VS:  BP 128/62   Pulse 80   Ht 6' 2"  (1.88 m)   Wt 273 lb (123.8 kg)   SpO2 95%   BMI 35.05 kg/m   Physical Exam  GEN: Well nourished, well developed, in no acute distress  Neck: no JVD, carotid bruits, or masses Cardiac:RRR; no murmurs, rubs, or gallops  Respiratory:  clear to auscultation bilaterally, normal work of breathing GI: soft, nontender, nondistended, + BS Ext: without cyanosis, clubbing, or edema, Good distal pulses bilaterally Neuro:  Alert and Oriented x 3 Psych: euthymic mood, full  affect  Wt Readings from Last 3 Encounters:  09/18/20 273 lb (123.8 kg)  07/20/20 (!) 278 lb (126.1 kg)  06/12/20 281 lb (127.5 kg)      Studies/Labs Reviewed:   EKG:  EKG is not ordered today.   Recent Labs: 10/25/2019: TSH 0.512 10/28/2019: ALT 13; Magnesium 2.1 07/20/2020: BUN 30; Creatinine, Ser 2.06; Hemoglobin 11.9; Platelets 260; Potassium 3.6; Sodium 140   Lipid Panel    Component Value Date/Time   CHOL 121 11/15/2012 0625   TRIG 57 11/15/2012 0625   HDL 36 (L) 11/15/2012 0625   CHOLHDL 3.4 11/15/2012 0625   VLDL 11 11/15/2012 0625   LDLCALC 74 11/15/2012 0625    Additional studies/ records that were reviewed today include:  2D echo 03/28/2020 IMPRESSIONS     1. Left ventricular ejection fraction, by estimation, is 55 to 60%. The  left ventricle has normal function. The left ventricle has no regional  wall motion abnormalities. There is mild left ventricular hypertrophy.  Left ventricular diastolic parameters  are consistent with Grade I diastolic dysfunction (impaired relaxation).   2. Right ventricular systolic function is normal. The right ventricular  size is normal.   3. Left atrial size was mildly dilated.   4. The mitral valve is normal in structure. No evidence of mitral valve  regurgitation. No evidence of mitral stenosis.   5. The aortic valve is tricuspid. Aortic valve regurgitation is not  visualized. No aortic stenosis is present.   FINDINGS   Left Ventricle: Left ventricular ejection fraction, by estimation, is 55  to 60%. The left ventricle has normal function. The left ventricle has no  regional wall motion abnormalities. The left ventricular internal cavity  size was normal in size. There is   mild left ventricular hypertrophy. Left ventricular diastolic parameters  are consistent with Grade I diastolic dysfunction (impaired relaxation).  Normal left ventricular filling pressure.   Right Ventricle:  The right ventricular size is normal. No  increase in  right ventricular wall thickness. Right ventricular systolic function is  normal.   Left Atrium: Left atrial size was mildly dilated.   Right Atrium: Right atrial size was normal in size.   Pericardium: There is no evidence of pericardial effusion.   Mitral Valve: The mitral valve is normal in structure. No evidence of  mitral valve regurgitation. No evidence of mitral valve stenosis.   Tricuspid Valve: The tricuspid valve is normal in structure. Tricuspid  valve regurgitation is not demonstrated. No evidence of tricuspid  stenosis.   Aortic Valve: The aortic valve is tricuspid. . There is mild thickening  and mild calcification of the aortic valve. Aortic valve regurgitation is  not visualized. No aortic stenosis is present. Mild aortic valve annular  calcification. There is mild  thickening of the aortic valve. There is mild calcification of the aortic  valve. Aortic valve mean gradient measures 3.6 mmHg. Aortic valve peak  gradient measures 6.8 mmHg. Aortic valve area, by VTI measures 3.62 cm.   Pulmonic Valve: The pulmonic valve was not well visualized. Pulmonic valve  regurgitation is not visualized. No evidence of pulmonic stenosis.   Aorta: The aortic root is normal in size and structure.   Pulmonary Artery: Indeterminant PASP, inadequate TR jet.   Venous: The inferior vena cava was not well visualized.   IAS/Shunts: No atrial level shunt detected by color flow Doppler.         ASSESSMENT:    1. Coronary artery disease involving native coronary artery of native heart without angina pectoris   2. Essential hypertension   3. Chronic diastolic CHF (congestive heart failure) (HCC)   4. Stage 3 chronic kidney disease, unspecified whether stage 3a or 3b CKD (HCC)   5. Class 2 severe obesity due to excess calories with serious comorbidity and body mass index (BMI) of 35.0 to 35.9 in adult [facility])      PLAN:  In order of problems listed above:  CAD  status post NSTEMI 11/16/2012 treated with DES to the LAD and diagonal 1 with residual disease in the RCA and circumflex treated medically continue aspirin and Pravachol  Hypertensive heart disease blood pressure well controlled on amlodipine  Chronic diastolic CHF 2D echo 0/06/6807 normal LVEF 55 to 60% with grade 1 DD managed with Lasix 40 mg twice daily.  Kidney function monitored every 3 months by renal  CKD creatinine 2.06 07/20/2020 managed by renal  Obesity has lost 30 to 40 pounds with diet and exercise  Hyperlipidemia on pravastatin managed by PCP.  Medication Adjustments/Labs and Tests Ordered: Current medicines are reviewed at length with the patient today.  Concerns regarding medicines are outlined above.  Medication changes, Labs and Tests ordered today are listed in the Patient Instructions below. Patient Instructions  Medication Instructions:  Continue all current medications.  Labwork: none  Testing/Procedures: none  Follow-Up: 6 months   Any Other Special Instructions Will Be Listed Below (If Applicable).  If you need a refill on your cardiac medications before your next appointment, please call your pharmacy.     Signed, Ermalinda Barrios, PA-C  09/18/2020 1:54 PM    [facility] [place], [address], [place]  [address] Phone: [phone]; Fax: [phone]

## 2020-09-18 NOTE — Patient Instructions (Signed)
Medication Instructions:  Continue all current medications.   Labwork: none  Testing/Procedures: none  Follow-Up: 6 months   Any Other Special Instructions Will Be Listed Below (If Applicable).   If you need a refill on your cardiac medications before your next appointment, please call your pharmacy.  

## 2020-09-19 DIAGNOSIS — M79671 Pain in right foot: Secondary | ICD-10-CM | POA: Diagnosis not present

## 2020-09-19 DIAGNOSIS — M79674 Pain in right toe(s): Secondary | ICD-10-CM | POA: Diagnosis not present

## 2020-09-19 DIAGNOSIS — L03031 Cellulitis of right toe: Secondary | ICD-10-CM | POA: Diagnosis not present

## 2020-10-02 DIAGNOSIS — G894 Chronic pain syndrome: Secondary | ICD-10-CM | POA: Diagnosis not present

## 2020-10-02 DIAGNOSIS — R143 Flatulence: Secondary | ICD-10-CM | POA: Diagnosis not present

## 2020-10-03 DIAGNOSIS — M25579 Pain in unspecified ankle and joints of unspecified foot: Secondary | ICD-10-CM | POA: Diagnosis not present

## 2020-10-03 DIAGNOSIS — G5750 Tarsal tunnel syndrome, unspecified lower limb: Secondary | ICD-10-CM | POA: Diagnosis not present

## 2020-10-03 DIAGNOSIS — M7731 Calcaneal spur, right foot: Secondary | ICD-10-CM | POA: Diagnosis not present

## 2020-10-03 DIAGNOSIS — M1990 Unspecified osteoarthritis, unspecified site: Secondary | ICD-10-CM | POA: Diagnosis not present

## 2020-10-03 DIAGNOSIS — M7732 Calcaneal spur, left foot: Secondary | ICD-10-CM | POA: Diagnosis not present

## 2020-10-09 DIAGNOSIS — H35033 Hypertensive retinopathy, bilateral: Secondary | ICD-10-CM | POA: Diagnosis not present

## 2020-10-09 DIAGNOSIS — M794 Hypertrophy of (infrapatellar) fat pad: Secondary | ICD-10-CM | POA: Diagnosis not present

## 2020-10-09 DIAGNOSIS — R42 Dizziness and giddiness: Secondary | ICD-10-CM | POA: Diagnosis not present

## 2020-10-09 DIAGNOSIS — H402231 Chronic angle-closure glaucoma, bilateral, mild stage: Secondary | ICD-10-CM | POA: Diagnosis not present

## 2020-10-10 ENCOUNTER — Ambulatory Visit: Payer: Medicare Other | Admitting: Endocrinology

## 2020-10-10 DIAGNOSIS — G5750 Tarsal tunnel syndrome, unspecified lower limb: Secondary | ICD-10-CM | POA: Diagnosis not present

## 2020-10-10 DIAGNOSIS — M7732 Calcaneal spur, left foot: Secondary | ICD-10-CM | POA: Diagnosis not present

## 2020-10-10 DIAGNOSIS — M7731 Calcaneal spur, right foot: Secondary | ICD-10-CM | POA: Diagnosis not present

## 2020-10-10 DIAGNOSIS — M1990 Unspecified osteoarthritis, unspecified site: Secondary | ICD-10-CM | POA: Diagnosis not present

## 2020-10-10 DIAGNOSIS — M25579 Pain in unspecified ankle and joints of unspecified foot: Secondary | ICD-10-CM | POA: Diagnosis not present

## 2020-10-16 DIAGNOSIS — H402211 Chronic angle-closure glaucoma, right eye, mild stage: Secondary | ICD-10-CM | POA: Diagnosis not present

## 2020-10-20 ENCOUNTER — Other Ambulatory Visit: Payer: Self-pay

## 2020-10-20 ENCOUNTER — Ambulatory Visit: Payer: Medicare Other | Admitting: Endocrinology

## 2020-10-20 ENCOUNTER — Encounter: Payer: Self-pay | Admitting: Endocrinology

## 2020-10-20 VITALS — BP 132/78 | HR 76 | Wt 269.0 lb

## 2020-10-20 DIAGNOSIS — E669 Obesity, unspecified: Secondary | ICD-10-CM

## 2020-10-20 DIAGNOSIS — E1169 Type 2 diabetes mellitus with other specified complication: Secondary | ICD-10-CM | POA: Diagnosis not present

## 2020-10-20 DIAGNOSIS — M1991 Primary osteoarthritis, unspecified site: Secondary | ICD-10-CM | POA: Diagnosis not present

## 2020-10-20 DIAGNOSIS — R3129 Other microscopic hematuria: Secondary | ICD-10-CM | POA: Diagnosis not present

## 2020-10-20 DIAGNOSIS — G894 Chronic pain syndrome: Secondary | ICD-10-CM | POA: Diagnosis not present

## 2020-10-20 LAB — POCT GLYCOSYLATED HEMOGLOBIN (HGB A1C): Hemoglobin A1C: 7.9 % — AB (ref 4.0–5.6)

## 2020-10-20 MED ORDER — INSULIN NPH (HUMAN) (ISOPHANE) 100 UNIT/ML ~~LOC~~ SUSP
22.0000 [IU] | SUBCUTANEOUS | 11 refills | Status: DC
Start: 2020-10-20 — End: 2021-02-28

## 2020-10-20 NOTE — Patient Instructions (Signed)
check your blood sugar twice a day.  vary the time of day when you check, between before the 3 meals, and at bedtime.  also check if you have symptoms of your blood sugar being too high or too low.  please keep a record of the readings and bring it to your next appointment here (or you can bring the meter itself).  You can write it on any piece of paper.  please call us sooner if your blood sugar goes below 70, or if you have a lot of readings over 200.  Please increase the insulin to 22 units each morning.   Please come back for a follow-up appointment in 3 months.

## 2020-10-20 NOTE — Progress Notes (Signed)
Subjective:    Patient ID: [name], male    DOB: 06/27/1946, 74 y.o.   MRN: [id number]  HPI Pt returns for f/u of diabetes mellitus:  DM type: Insulin-requiring type 2 Dx'ed: 4268 Complications: CAD, PAD, and stage 4 CRI.   Therapy: insulin since 2020.  DKA: never Severe hypoglycemia: never.  Pancreatitis: never Pancreatic imaging: normal on 2017 CT SDOH: he declines brand name meds, due to cost.   Other: He did not tolerate metformin (nausea); he declines multiple daily injections.  Interval history: no cbg record, but states fasting cbg's vary from 110-170.  There is no trend throughout the day.  pt states he feels well in general.  Pt says he takes the insulin as rx'ed.   Past Medical History:  Diagnosis Date  . Anxiety   . Benign prostatic hypertrophy    Nocturia  . CAD (coronary artery disease)    a.  NSTEMI 10/2012 s/p DES to LAD & DES to 1st diagonal with residual diffuse nonobstructive dz in LCx/RCA   . Chronic kidney disease, stage 3, mod decreased GFR (HCC)    Creatinine of 1.5 in 03/2009, STAGE 4 now  . Degenerative joint disease   . Depression    hx of  . Diabetes mellitus, type II ([place]) 12/10/2012.  . Diastolic heart failure    Pedal edema, "a long time ago"  . Erectile dysfunction   . Flu 2013   hx of  . GERD (gastroesophageal reflux disease)   . Gout   . Hemorrhoids   . High cholesterol   . Hypertension    Exercise induced  . Myocardial infarction ([place])   . NSVT (nonsustained ventricular tachycardia) (HCC)    Exercise induced  . Obesity   . 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 Tubular adenoma 2014    Past Surgical History:  Procedure Laterality Date  . BACK SURGERY  2007   x2  . CARDIAC CATHETERIZATION     with stent placement  . COLONOSCOPY WITH ESOPHAGOGASTRODUODENOSCOPY (EGD) N/A 11/23/2013   Dr. Oneida Alar- TCS= left sided colitis and mild proctitis likely due to ischemic colitis, moderate internal hemorrhoids,  small nodule in the rectum, bx=tubular adenoma, EGD=-mild non-erosive gastritis  . COLONOSCOPY WITH PROPOFOL N/A 07/24/2020   Procedure: COLONOSCOPY WITH PROPOFOL;  Surgeon: Daneil Dolin, MD;  [facility];  Service: Endoscopy;  Laterality: N/A;  10:15am  . HERNIA REPAIR    . LEFT HEART CATHETERIZATION WITH CORONARY ANGIOGRAM N/A 11/16/2012   Procedure: LEFT HEART CATHETERIZATION WITH CORONARY ANGIOGRAM;  Surgeon: Sherren Mocha, MD;  Location: [facility] CATH LAB;  Service: Cardiovascular;  Laterality: N/A;  . PERCUTANEOUS CORONARY STENT INTERVENTION (PCI-S)  11/16/2012   Procedure: PERCUTANEOUS CORONARY STENT INTERVENTION (PCI-S);  Surgeon: Sherren Mocha, MD;  Location: [facility] CATH LAB;  Service: Cardiovascular;;  . POLYPECTOMY  07/24/2020   Procedure: POLYPECTOMY;  Surgeon: Daneil Dolin, MD;  [facility];  Service: Endoscopy;;  . TOTAL KNEE ARTHROPLASTY Right 11/15/2013   Procedure: RIGHT TOTAL KNEE ARTHROPLASTY;  Surgeon: Mauri Pole, MD;  Location: [facility];  Service: Orthopedics;  Laterality: Right;  . TOTAL KNEE ARTHROPLASTY Left 03/07/2015   Procedure: LEFT TOTAL KNEE ARTHROPLASTY;  Surgeon: Paralee Cancel, MD;  Location: [facility];  Service: Orthopedics;  Laterality: Left;    Social History   Socioeconomic History  . Marital status: Widowed    Spouse name: Not on file  . Number of children: Not  on file  . Years of education: Not on file  . Highest education level: Not on file  Occupational History  . Occupation: Disabled    Employer: RETIRED  Tobacco Use  . Smoking status: Former Smoker    Packs/day: 1.00    Years: 30.00    Pack years: 30.00    Types: Cigarettes    Start date: 01/22/1965    Quit date: 12/23/1992    Years since quitting: 27.8  . Smokeless tobacco: Never Used  Vaping Use  . Vaping Use: Never used  Substance and Sexual Activity  . Alcohol use: No    Alcohol/week: 0.0 standard drinks  . Drug use: No  . Sexual activity: Not Currently  Other  Topics Concern  . Not on file  Social History Narrative   Married   Right handed   One story home no caffeine   Social Determinants of Health   Financial Resource Strain:   . Difficulty of Paying Living Expenses: Not on file  Food Insecurity:   . Worried About Charity fundraiser in the Last Year: Not on file  . Ran Out of Food in the Last Year: Not on file  Transportation Needs:   . Lack of Transportation (Medical): Not on file  . Lack of Transportation (Non-Medical): Not on file  Physical Activity:   . Days of Exercise per Week: Not on file  . Minutes of Exercise per Session: Not on file  Stress:   . Feeling of Stress : Not on file  Social Connections:   . Frequency of Communication with Friends and Family: Not on file  . Frequency of Social Gatherings with Friends and Family: Not on file  . Attends Religious Services: Not on file  . Active Member of Clubs or Organizations: Not on file  . Attends Archivist Meetings: Not on file  . Marital Status: Not on file  Intimate Partner Violence:   . Fear of Current or Ex-Partner: Not on file  . Emotionally Abused: Not on file  . Physically Abused: Not on file  . Sexually Abused: Not on file    Current Outpatient Medications on File Prior to Visit  Medication Sig Dispense Refill  . acarbose (PRECOSE) 50 MG tablet Take 50 mg by mouth in the morning, at noon, and at bedtime.     Marland Kitchen acetaminophen (TYLENOL) 650 MG CR tablet Take 1,300 mg by mouth daily.    Marland Kitchen allopurinol (ZYLOPRIM) 100 MG tablet Take 100 mg by mouth daily.    Marland Kitchen amLODipine (NORVASC) 10 MG tablet Take 10 mg by mouth every evening.     Marland Kitchen aspirin EC 81 MG tablet Take 1 tablet (81 mg total) by mouth daily. 90 tablet 3  . calcitRIOL (ROCALTROL) 0.25 MCG capsule Take 0.25 mcg by mouth every Sunday.     . cholecalciferol (VITAMIN D) 1000 units tablet Take 1,000 Units daily by mouth.    . cloNIDine (CATAPRES) 0.1 MG tablet Take 0.1 mg by mouth 2 (two) times daily.     . colchicine 0.6 MG tablet Take 0.6 mg by mouth daily.     . furosemide (LASIX) 40 MG tablet Take 1 tablet (40 mg total) by mouth 2 (two) times daily. 60 tablet 3  . gabapentin (NEURONTIN) 300 MG capsule Take 300 mg by mouth 3 (three) times daily.     Marland Kitchen HYDROcodone-acetaminophen (NORCO) 10-325 MG tablet Take 1-2 tablets by mouth 4 (four) times daily as needed for moderate pain or severe  pain. 10 tablet 0  . meclizine (ANTIVERT) 25 MG tablet Take 25 mg by mouth 3 (three) times daily as needed for dizziness.    . nitroGLYCERIN (NITROSTAT) 0.4 MG SL tablet PLACE ONE TABLET UNDER THE TONGUE EVERY 5 MINUTES FOR 3 DOSES AS NEEDED 25 tablet 3  . omeprazole (PRILOSEC) 20 MG capsule Take 20 mg by mouth daily.     . pravastatin (PRAVACHOL) 20 MG tablet Take 1 tablet (20 mg total) by mouth every evening. 90 tablet 3  . TRUE METRIX BLOOD GLUCOSE TEST test strip USE TO TEST BLOOD SUGAR UP TO TWICE DAILY OR AS DIRECTED 180 each 3   No current facility-administered medications on file prior to visit.    Allergies  Allergen Reactions  . Niacin And Related Other (See Comments)    Reaction: flushing and burning side effects even with extended release    Family History  Problem Relation Age of Onset  . Hypertension Other   . Cancer Other   . Heart disease Other   . Diabetes Other   . Hypertension Mother   . Cancer Father        brain  . CAD Brother   . Colon cancer Neg Hx     BP 132/78   Pulse 76   Wt 269 lb (122 kg)   SpO2 97%   BMI 34.54 kg/m    Review of Systems He denies hypoglycemia    Objective:   Physical Exam VITAL SIGNS:  See vs page GENERAL: no distress Pulses: dorsalis pedis intact bilat.   MSK: no deformity of the feet CV: 2+ bilat leg edema Skin:  no ulcer on the feet, but the skin is thickened and dry.  normal color and temp on the feet.  Old healed surgical scar on the right foot.   Neuro: sensation is intact to touch on the feet, but decreased from normal.  Ext:  there is bilateral onychomycosis of the toenails.    Lab Results  Component Value Date   HGBA1C 7.9 (A) 10/20/2020   Lab Results  Component Value Date   CREATININE 2.06 (H) 07/20/2020   BUN 30 (H) 07/20/2020   NA 140 07/20/2020   K 3.6 07/20/2020   CL 105 07/20/2020   CO2 24 07/20/2020       Assessment & Plan:  Insulin-requiring type 2 DM, with PAD: uncontrolled.   Patient Instructions  check your blood sugar twice a day.  vary the time of day when you check, between before the 3 meals, and at bedtime.  also check if you have symptoms of your blood sugar being too high or too low.  please keep a record of the readings and bring it to your next appointment here (or you can bring the meter itself).  You can write it on any piece of paper.  please call us sooner if your blood sugar goes below 70, or if you have a lot of readings over 200.  Please increase the insulin to 22 units each morning.   Please come back for a follow-up appointment in 3 months.

## 2020-10-27 DIAGNOSIS — R3129 Other microscopic hematuria: Secondary | ICD-10-CM | POA: Diagnosis not present

## 2020-10-30 DIAGNOSIS — H402221 Chronic angle-closure glaucoma, left eye, mild stage: Secondary | ICD-10-CM | POA: Diagnosis not present

## 2020-11-01 ENCOUNTER — Other Ambulatory Visit (HOSPITAL_COMMUNITY): Payer: Self-pay | Admitting: Internal Medicine

## 2020-11-01 DIAGNOSIS — E1140 Type 2 diabetes mellitus with diabetic neuropathy, unspecified: Secondary | ICD-10-CM | POA: Diagnosis not present

## 2020-11-01 DIAGNOSIS — R319 Hematuria, unspecified: Secondary | ICD-10-CM

## 2020-11-01 DIAGNOSIS — R109 Unspecified abdominal pain: Secondary | ICD-10-CM

## 2020-11-01 DIAGNOSIS — I10 Essential (primary) hypertension: Secondary | ICD-10-CM | POA: Diagnosis not present

## 2020-11-01 DIAGNOSIS — G894 Chronic pain syndrome: Secondary | ICD-10-CM | POA: Diagnosis not present

## 2020-11-01 DIAGNOSIS — M1991 Primary osteoarthritis, unspecified site: Secondary | ICD-10-CM | POA: Diagnosis not present

## 2020-11-08 DIAGNOSIS — H402221 Chronic angle-closure glaucoma, left eye, mild stage: Secondary | ICD-10-CM | POA: Diagnosis not present

## 2020-11-09 DIAGNOSIS — E119 Type 2 diabetes mellitus without complications: Secondary | ICD-10-CM | POA: Insufficient documentation

## 2020-11-09 DIAGNOSIS — I951 Orthostatic hypotension: Secondary | ICD-10-CM | POA: Diagnosis not present

## 2020-11-09 DIAGNOSIS — E7800 Pure hypercholesterolemia, unspecified: Secondary | ICD-10-CM | POA: Insufficient documentation

## 2020-11-09 DIAGNOSIS — I10 Essential (primary) hypertension: Secondary | ICD-10-CM | POA: Insufficient documentation

## 2020-11-09 DIAGNOSIS — H16211 Exposure keratoconjunctivitis, right eye: Secondary | ICD-10-CM | POA: Diagnosis not present

## 2020-11-09 DIAGNOSIS — H50011 Monocular esotropia, right eye: Secondary | ICD-10-CM | POA: Diagnosis not present

## 2020-11-09 DIAGNOSIS — R42 Dizziness and giddiness: Secondary | ICD-10-CM | POA: Insufficient documentation

## 2020-11-09 DIAGNOSIS — D3161 Benign neoplasm of unspecified site of right orbit: Secondary | ICD-10-CM | POA: Diagnosis not present

## 2020-11-09 DIAGNOSIS — H0589 Other disorders of orbit: Secondary | ICD-10-CM | POA: Diagnosis not present

## 2020-11-10 ENCOUNTER — Encounter: Payer: Self-pay | Admitting: Internal Medicine

## 2020-11-13 ENCOUNTER — Other Ambulatory Visit: Payer: Self-pay | Admitting: Ophthalmology

## 2020-11-13 DIAGNOSIS — R42 Dizziness and giddiness: Secondary | ICD-10-CM

## 2020-11-13 DIAGNOSIS — H5000 Unspecified esotropia: Secondary | ICD-10-CM

## 2020-11-14 ENCOUNTER — Ambulatory Visit (HOSPITAL_COMMUNITY)
Admission: RE | Admit: 2020-11-14 | Discharge: 2020-11-14 | Disposition: A | Discharge status: Home / Self Care | Payer: Medicare Other | Source: Ambulatory Visit | Attending: Internal Medicine | Admitting: Internal Medicine

## 2020-11-14 ENCOUNTER — Other Ambulatory Visit: Payer: Self-pay

## 2020-11-14 DIAGNOSIS — K429 Umbilical hernia without obstruction or gangrene: Secondary | ICD-10-CM | POA: Diagnosis not present

## 2020-11-14 DIAGNOSIS — M109 Gout, unspecified: Secondary | ICD-10-CM | POA: Diagnosis not present

## 2020-11-14 DIAGNOSIS — R319 Hematuria, unspecified: Secondary | ICD-10-CM | POA: Diagnosis not present

## 2020-11-14 DIAGNOSIS — R197 Diarrhea, unspecified: Secondary | ICD-10-CM | POA: Diagnosis not present

## 2020-11-14 DIAGNOSIS — R109 Unspecified abdominal pain: Secondary | ICD-10-CM | POA: Diagnosis not present

## 2020-11-14 DIAGNOSIS — K8689 Other specified diseases of pancreas: Secondary | ICD-10-CM | POA: Diagnosis not present

## 2020-11-14 DIAGNOSIS — N2889 Other specified disorders of kidney and ureter: Secondary | ICD-10-CM | POA: Diagnosis not present

## 2020-11-20 DIAGNOSIS — R809 Proteinuria, unspecified: Secondary | ICD-10-CM | POA: Diagnosis not present

## 2020-11-20 DIAGNOSIS — I5032 Chronic diastolic (congestive) heart failure: Secondary | ICD-10-CM | POA: Diagnosis not present

## 2020-11-20 DIAGNOSIS — E1129 Type 2 diabetes mellitus with other diabetic kidney complication: Secondary | ICD-10-CM | POA: Diagnosis not present

## 2020-11-20 DIAGNOSIS — N189 Chronic kidney disease, unspecified: Secondary | ICD-10-CM | POA: Diagnosis not present

## 2020-11-20 DIAGNOSIS — E1122 Type 2 diabetes mellitus with diabetic chronic kidney disease: Secondary | ICD-10-CM | POA: Diagnosis not present

## 2020-11-21 ENCOUNTER — Telehealth: Payer: Self-pay | Admitting: *Deleted

## 2020-11-21 NOTE — Telephone Encounter (Signed)
° °  [facility] Pre-operative Risk Assessment    HEARTCARE STAFF: - Please ensure there is not already an duplicate clearance open for this procedure. - Under Visit Info/Reason for Call, type in Other and utilize the format Clearance MM/DD/YY or Clearance TBD. Do not use dashes or single digits. - If request is for dental extraction, please clarify the # of teeth to be extracted.  Request for surgical clearance:  1. What type of surgery is being performed? RIGHT ANTERIOR ORBITOTOMY WITH EXCISION OF ORBIT FAT   2. When is this surgery scheduled? 12/06/20   3. What type of clearance is required (medical clearance vs. Pharmacy clearance to hold med vs. Both)? MEDICAL  4. Are there any medications that need to be held prior to surgery and how long? ASA   5. Practice name and name of physician performing surgery? [facility]; DR. Letitia Caul [place]   6. What is the office phone number? [phone]   7.   What is the office fax number? [phone]  8.   Anesthesia type (None, local, MAC, general) ? MAC   Julaine Hua 11/21/2020, 9:02 AM  _________________________________________________________________   (provider comments below)

## 2020-11-21 NOTE — Telephone Encounter (Signed)
Dr. Domenic Polite -sorry to bother you, I see you are in [place].  This is a Konaswaran pt and he had NSTEMI 2013 with stent to LAD and non obstructive disease in other vessels.  Has done well since, if stable when I call him can we hold ASA for 5-7 days for Rt anterior orbitotomy with excision of orbit fat.?    He had echo in April 2021 with normal EF and valves.  thaks.

## 2020-11-21 NOTE — Telephone Encounter (Signed)
It looks like he has been established to follow-up with Dr. Harl Bowie based on the next scheduled appointment, but I am happy to help you with this question.  If he has been clinically stable from a cardiac perspective, no worsening angina or indication for repeat ischemic testing, I would anticipate he should be able to hold aspirin for 5 to 7 days as you have suggested.

## 2020-11-21 NOTE — Telephone Encounter (Signed)
   Primary Cardiologist: Carlyle Dolly, MD  Chart reviewed as part of pre-operative protocol coverage. Patient was contacted 11/21/2020 in reference to pre-operative risk assessment for pending surgery as outlined below.  [name] was last seen on 09/18/20 by Ermalinda Barrios, PA-C for CAD with hx of NSTEMI in 2013 treated with DES stent to LAD and diag 1.  Also has HTN, HLD, chronic diatolic HF, CKD-3.  Since that day, [name] has done well without chest pain or SOB.  He is active and meets at least 4 METS of activity.    He may hold the ASA 5-7 days prior to procedure and resume post op.   Therefore, based on ACC/AHA guidelines, the patient would be at acceptable risk for the planned procedure without further cardiovascular testing.   The patient was advised that if he develops new symptoms prior to surgery to contact our office to arrange for a follow-up visit, and he verbalized understanding.  I will route this recommendation to the requesting party via Epic fax function and remove from pre-op pool. Please call with questions.  Cecilie Kicks, NP 11/21/2020, 2:31 PM

## 2020-11-23 DIAGNOSIS — I129 Hypertensive chronic kidney disease with stage 1 through stage 4 chronic kidney disease, or unspecified chronic kidney disease: Secondary | ICD-10-CM | POA: Diagnosis not present

## 2020-11-23 DIAGNOSIS — N189 Chronic kidney disease, unspecified: Secondary | ICD-10-CM | POA: Diagnosis not present

## 2020-11-23 DIAGNOSIS — R809 Proteinuria, unspecified: Secondary | ICD-10-CM | POA: Diagnosis not present

## 2020-11-23 DIAGNOSIS — E876 Hypokalemia: Secondary | ICD-10-CM | POA: Diagnosis not present

## 2020-11-23 DIAGNOSIS — I5032 Chronic diastolic (congestive) heart failure: Secondary | ICD-10-CM | POA: Diagnosis not present

## 2020-12-01 DIAGNOSIS — E1129 Type 2 diabetes mellitus with other diabetic kidney complication: Secondary | ICD-10-CM | POA: Diagnosis not present

## 2020-12-01 DIAGNOSIS — H11001 Unspecified pterygium of right eye: Secondary | ICD-10-CM | POA: Diagnosis not present

## 2020-12-01 DIAGNOSIS — G894 Chronic pain syndrome: Secondary | ICD-10-CM | POA: Diagnosis not present

## 2020-12-06 ENCOUNTER — Other Ambulatory Visit: Payer: Self-pay

## 2020-12-06 DIAGNOSIS — D3161 Benign neoplasm of unspecified site of right orbit: Secondary | ICD-10-CM | POA: Diagnosis not present

## 2020-12-06 DIAGNOSIS — I951 Orthostatic hypotension: Secondary | ICD-10-CM | POA: Diagnosis not present

## 2020-12-06 DIAGNOSIS — H16211 Exposure keratoconjunctivitis, right eye: Secondary | ICD-10-CM | POA: Diagnosis not present

## 2020-12-12 ENCOUNTER — Other Ambulatory Visit: Payer: Self-pay

## 2020-12-12 ENCOUNTER — Ambulatory Visit: Payer: Medicare Other | Admitting: Internal Medicine

## 2020-12-12 ENCOUNTER — Encounter: Payer: Self-pay | Admitting: Internal Medicine

## 2020-12-12 VITALS — BP 155/79 | HR 60 | Temp 96.8°F | Ht 74.5 in | Wt 273.4 lb

## 2020-12-12 DIAGNOSIS — R1033 Periumbilical pain: Secondary | ICD-10-CM

## 2020-12-12 DIAGNOSIS — K219 Gastro-esophageal reflux disease without esophagitis: Secondary | ICD-10-CM | POA: Diagnosis not present

## 2020-12-12 DIAGNOSIS — K5909 Other constipation: Secondary | ICD-10-CM

## 2020-12-12 NOTE — Progress Notes (Signed)
Primary Care Physician:  Redmond School, MD Primary Gastroenterologist:  Dr. Gala Romney  Pre-Procedure History & Physical: HPI:  [name] is a 74 y.o. male here for a 41-monthhistory of abdominal pain.  Patient describes a rumbling or "gassy" discomfort in a bandlike distribution across his abdomen at the level of the umbilicus.  Occurs after he eats.  This got to a point where he is afraid to eat -  states he has lost 30 pounds over the past few months.  We actually have him down 20 pounds since he was weighed here in May of this year. He has not had any nausea or vomiting.  He denies reflux symptoms or dysphagia.  No early satiety per se.  On omeprazole 20 mg daily empirically without much difference in his symptoms. Bentyl 10 mg 4 times daily added to his regimen recently which he seems to think has helped some of his pain.  On Norco for chronic pain.  Chronically constipated  - having bowel movement every other day to every 2 days.  Denies melena or red blood per rectum. Colonoscopy in August of this year revealed hemorrhoids,  rectosigmoid lipoma, small adenoma removed. CT (noncontrast) revealed advanced atherosclerotic disease diffusely.  Small amount of sludge in the gallbladder.  No obvious intra-abdominal abdominal process to explain abdominal pain. Patient was previously on lactulose for constipation but has come off this medication sometime ago. Patient takes nothing for constipation at this time.  He denies NSAID use..Marland Kitchen    Past Medical History:  Diagnosis Date  . Anxiety   . Benign prostatic hypertrophy    Nocturia  . CAD (coronary artery disease)    a.  NSTEMI 10/2012 s/p DES to LAD & DES to 1st diagonal with residual diffuse nonobstructive dz in LCx/RCA   . Chronic kidney disease, stage 3, mod decreased GFR (HCC)    Creatinine of 1.5 in 03/2009, STAGE 4 now  . Degenerative joint disease   . Depression    hx of  . Diabetes mellitus, type II ([facility] 12/10/2012.  .  Diastolic heart failure    Pedal edema, "a long time ago"  . Erectile dysfunction   . Flu 2013   hx of  . GERD (gastroesophageal reflux disease)   . Gout   . Hemorrhoids   . High cholesterol   . Hypertension    Exercise induced  . Myocardial infarction ([facility]   . NSVT (nonsustained ventricular tachycardia) (HCC)    Exercise induced  . Obesity   . 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 Tubular adenoma 2014    Past Surgical History:  Procedure Laterality Date  . BACK SURGERY  2007   x2  . CARDIAC CATHETERIZATION     with stent placement  . COLONOSCOPY WITH ESOPHAGOGASTRODUODENOSCOPY (EGD) N/A 11/23/2013   Dr. FOneida Alar TCS= left sided colitis and mild proctitis likely due to ischemic colitis, moderate internal hemorrhoids, small nodule in the rectum, bx=tubular adenoma, EGD=-mild non-erosive gastritis  . COLONOSCOPY WITH PROPOFOL N/A 07/24/2020   Procedure: COLONOSCOPY WITH PROPOFOL;  Surgeon: RDaneil Dolin MD;  [facility];  Service: Endoscopy;  Laterality: N/A;  10:15am  . HERNIA REPAIR    . LEFT HEART CATHETERIZATION WITH CORONARY ANGIOGRAM N/A 11/16/2012   Procedure: LEFT HEART CATHETERIZATION WITH CORONARY ANGIOGRAM;  Surgeon: MSherren Mocha MD;  [facility];  Service: Cardiovascular;  Laterality: N/A;  . PERCUTANEOUS CORONARY STENT INTERVENTION (PCI-S)  11/16/2012   Procedure: PERCUTANEOUS CORONARY STENT INTERVENTION (PCI-S);  Surgeon: Sherren Mocha, MD;  [facility];  Service: Cardiovascular;;  . POLYPECTOMY  07/24/2020   Procedure: POLYPECTOMY;  Surgeon: Daneil Dolin, MD;  [facility];  Service: Endoscopy;;  . TOTAL KNEE ARTHROPLASTY Right 11/15/2013   Procedure: RIGHT TOTAL KNEE ARTHROPLASTY;  Surgeon: Mauri Pole, MD;  Location: [facility] ORS;  Service: Orthopedics;  Laterality: Right;  . TOTAL KNEE ARTHROPLASTY Left 03/07/2015   Procedure: LEFT TOTAL KNEE ARTHROPLASTY;  Surgeon: Paralee Cancel, MD;  Location: [facility]  ORS;  Service: Orthopedics;  Laterality: Left;    Prior to Admission medications   Medication Sig Start Date End Date Taking? Authorizing Provider  acarbose (PRECOSE) 50 MG tablet Take 50 mg by mouth in the morning, at noon, and at bedtime.  10/20/17  Yes [provider]  allopurinol (ZYLOPRIM) 100 MG tablet Take 100 mg by mouth daily. 08/17/20  Yes [provider]  amLODipine (NORVASC) 10 MG tablet Take 10 mg by mouth every evening.    Yes [provider]  aspirin EC 81 MG tablet Take 1 tablet (81 mg total) by mouth daily. 09/14/19  Yes Herminio Commons, MD  bismuth subsalicylate (PEPTO-BISMOL) 262 MG/15ML suspension Take 30 mLs by mouth as needed.   Yes [provider]  calcitRIOL (ROCALTROL) 0.25 MCG capsule Take 0.25 mcg by mouth every Sunday.  12/13/16  Yes [provider]  cholecalciferol (VITAMIN D) 1000 units tablet Take 1,000 Units daily by mouth.   Yes [provider]  cloNIDine (CATAPRES) 0.1 MG tablet Take 0.1 mg by mouth 2 (two) times daily. 02/15/20  Yes [provider]  colchicine 0.6 MG tablet Take 0.6 mg by mouth daily.  08/17/20  Yes [provider]  dicyclomine (BENTYL) 10 MG capsule 3 (three) times daily.   Yes [provider]  furosemide (LASIX) 40 MG tablet Take 1 tablet (40 mg total) by mouth 2 (two) times daily. 08/09/19  Yes Herminio Commons, MD  gabapentin (NEURONTIN) 300 MG capsule Take 300 mg by mouth 3 (three) times daily.  10/19/19  Yes [provider]  HYDROcodone-acetaminophen (NORCO) 10-325 MG tablet Take 1-2 tablets by mouth 4 (four) times daily as needed for moderate pain or severe pain. 10/28/19  Yes Shah, Pratik D, DO  insulin NPH Human (NOVOLIN N) 100 UNIT/ML injection Inject 0.22 mLs (22 Units total) into the skin every morning. Ands syringes 1/day 10/20/20  Yes Renato Shin, MD  meclizine (ANTIVERT) 25 MG tablet Take 25 mg by mouth 3 (three) times daily.   Yes  [provider]  nitroGLYCERIN (NITROSTAT) 0.4 MG SL tablet PLACE ONE TABLET UNDER THE TONGUE EVERY 5 MINUTES FOR 3 DOSES AS NEEDED 07/22/17  Yes Herminio Commons, MD  omeprazole (PRILOSEC) 20 MG capsule Take 20 mg by mouth daily.  10/22/19  Yes [provider]  pravastatin (PRAVACHOL) 20 MG tablet Take 1 tablet (20 mg total) by mouth every evening. 09/14/19 07/12/21 Yes Herminio Commons, MD  TRUE METRIX BLOOD GLUCOSE TEST test strip USE TO TEST BLOOD SUGAR UP TO TWICE DAILY OR AS DIRECTED 08/19/20  Yes Renato Shin, MD  acetaminophen (TYLENOL) 650 MG CR tablet Take 1,300 mg by mouth daily. Patient not taking: Reported on 12/12/2020    [provider]    Allergies as of 12/12/2020 - Review Complete 12/12/2020  Allergen Reaction Noted  . Niacin and related Other (See Comments) 10/24/2012    Family  History  Problem Relation Age of Onset  . Hypertension Other   . Cancer Other   . Heart disease Other   . Diabetes Other   . Hypertension Mother   . Cancer Father        brain  . CAD Brother   . Colon cancer Neg Hx     Social History   Socioeconomic History  . Marital status: Widowed    Spouse name: Not on file  . Number of children: Not on file  . Years of education: Not on file  . Highest education level: Not on file  Occupational History  . Occupation: Disabled    Employer: RETIRED  Tobacco Use  . Smoking status: Former Smoker    Packs/day: 1.00    Years: 30.00    Pack years: 30.00    Types: Cigarettes    Start date: 01/22/1965    Quit date: 12/23/1992    Years since quitting: 27.9  . Smokeless tobacco: Never Used  Vaping Use  . Vaping Use: Never used  Substance and Sexual Activity  . Alcohol use: No    Alcohol/week: 0.0 standard drinks  . Drug use: No  . Sexual activity: Not Currently  Other Topics Concern  . Not on file  Social History Narrative   Married   Right handed   One story home no caffeine   Social Determinants of Health    Financial Resource Strain: Not on file  Food Insecurity: Not on file  Transportation Needs: Not on file  Physical Activity: Not on file  Stress: Not on file  Social Connections: Not on file  Intimate Partner Violence: Not on file    Review of Systems: See HPI, otherwise negative ROS  Physical Exam: BP (!) 155/79   Pulse 60   Temp (!) 96.8 F (36 C) (Temporal)   Ht 6' 2.5" (1.892 m)   Wt 273 lb 6.4 oz (124 kg)   BMI 34.63 kg/m  General:   Alert,   pleasant and cooperative in NAD Mouth:  No deformity or lesions. Neck:  Supple; no masses or thyromegaly. No significant cervical adenopathy. Lungs:  Clear throughout to auscultation.   No wheezes, crackles, or rhonchi. No acute distress. Heart:  Regular rate and rhythm; no murmurs, clicks, rubs,  or gallops. Abdomen: Nondistended.  Positive bowel sounds.  No bruits.  Abdomen is soft and entirely nontender to palpation.  No appreciable mass organomegaly  Rectal: Refused by the patient  Impression/Plan: 74 year old gentleman with multiple comorbidities including chronic kidney disease, CAD, heart failure presents with 46-monthhistory of postprandial abdominal pain and weight loss.  Constipation on chronic opioid therapy. Constipation may be the major factor here, however, in the setting of weight loss and an aversion to eating because of pain, we need to consider mesenteric ischemia as well.  I doubt acid peptic or gallbladder disease in evolution.  Recommendations:  Update labs with Chem-12, CBC and serum lipase  Will work towards normalizing bowel function to see if this produces improvement in abdominal pain.  To this end, will try some Linzess low-dose 72 mcg daily.  Samples provided for 3 weeks  May continue Bentyl and omeprazole for the time being  He is to call [place] 3 weeks and let u[place]ore[place]now how he is doing and we will go from there  Unless of symptoms of essentially resolved with management of his constipation, would  consider evaluation of his mesenteric vasculature as next step in the evaluation of this  nice gentleman.  Further recommendations to follow.     Notice: This dictation was prepared with Dragon dictation along with smaller phrase technology. Any transcriptional errors that result from this process are unintentional and may not be corrected upon review.

## 2020-12-12 NOTE — Patient Instructions (Signed)
Will work towards normalizing bowel function see if this produces improvement in abdominal pain.  To this end, will try some Linzess low-dose 72 mcg daily.  Samples provided for 3 weeks  May continue Bentyl and omeprazole for the time being  He is to call us in 3 weeks and let us know how he is doing and we will go from there  Unless of symptoms of essentially resolved with management of his constipation, would consider evaluation of his mesenteric vasculature as next step in the evaluation of this nice gentleman.  Further recommendations to follow.

## 2020-12-13 ENCOUNTER — Telehealth: Payer: Self-pay

## 2020-12-13 NOTE — Telephone Encounter (Signed)
Lmom, waiting on a return call. Per Dr. Gala Romney, he would like pt to las including chem 12, lipase and CBC between now and next week.

## 2020-12-14 ENCOUNTER — Other Ambulatory Visit: Payer: Self-pay

## 2020-12-14 ENCOUNTER — Telehealth: Payer: Self-pay | Admitting: Internal Medicine

## 2020-12-14 DIAGNOSIS — R109 Unspecified abdominal pain: Secondary | ICD-10-CM | POA: Diagnosis not present

## 2020-12-14 DIAGNOSIS — Z79899 Other long term (current) drug therapy: Secondary | ICD-10-CM | POA: Diagnosis not present

## 2020-12-14 NOTE — Telephone Encounter (Signed)
Spoke with pt. Lab orders were placed per Dr. Gala Romney for [facility] per pts request.

## 2020-12-14 NOTE — Telephone Encounter (Signed)
Pt was returning a call from yesterday. [phone]

## 2020-12-15 LAB — COMPREHENSIVE METABOLIC PANEL
ALT: 20 IU/L (ref 0–44)
AST: 17 IU/L (ref 0–40)
Albumin/Globulin Ratio: 1.5 (ref 1.2–2.2)
Albumin: 4.3 g/dL (ref 3.7–4.7)
Alkaline Phosphatase: 104 IU/L (ref 44–121)
BUN/Creatinine Ratio: 21 (ref 10–24)
BUN: 36 mg/dL — ABNORMAL HIGH (ref 8–27)
Bilirubin Total: 0.4 mg/dL (ref 0.0–1.2)
CO2: 20 mmol/L (ref 20–29)
Calcium: 9.3 mg/dL (ref 8.6–10.2)
Chloride: 101 mmol/L (ref 96–106)
Creatinine, Ser: 1.74 mg/dL — ABNORMAL HIGH (ref 0.76–1.27)
GFR calc Af Amer: 43 mL/min/{1.73_m2} — ABNORMAL LOW (ref >59)
GFR calc non Af Amer: 38 mL/min/{1.73_m2} — ABNORMAL LOW (ref >59)
Globulin, Total: 2.8 g/dL (ref 1.5–4.5)
Glucose: 172 mg/dL — ABNORMAL HIGH (ref 65–99)
Potassium: 3.9 mmol/L (ref 3.5–5.2)
Sodium: 144 mmol/L (ref 134–144)
Total Protein: 7.1 g/dL (ref 6.0–8.5)

## 2020-12-15 LAB — CBC WITH DIFFERENTIAL/PLATELET
Basophils Absolute: 0 10*3/uL (ref 0.0–0.2)
Basos: 1 %
EOS (ABSOLUTE): 0.1 10*3/uL (ref 0.0–0.4)
Eos: 1 %
Hematocrit: 37.2 % — ABNORMAL LOW (ref 37.5–51.0)
Hemoglobin: 12.8 g/dL — ABNORMAL LOW (ref 13.0–17.7)
Immature Grans (Abs): 0.1 10*3/uL (ref 0.0–0.1)
Immature Granulocytes: 1 %
Lymphocytes Absolute: 1.6 10*3/uL (ref 0.7–3.1)
Lymphs: 21 %
MCH: 32.7 pg (ref 26.6–33.0)
MCHC: 34.4 g/dL (ref 31.5–35.7)
MCV: 95 fL (ref 79–97)
Monocytes Absolute: 0.7 10*3/uL (ref 0.1–0.9)
Monocytes: 9 %
Neutrophils Absolute: 5.3 10*3/uL (ref 1.4–7.0)
Neutrophils: 67 %
Platelets: 193 10*3/uL (ref 150–450)
RBC: 3.91 x10E6/uL — ABNORMAL LOW (ref 4.14–5.80)
RDW: 14.4 % (ref 11.6–15.4)
WBC: 7.9 10*3/uL (ref 3.4–10.8)

## 2020-12-15 LAB — LIPASE: Lipase: 21 U/L (ref 13–78)

## 2020-12-20 ENCOUNTER — Telehealth: Payer: Self-pay

## 2020-12-20 DIAGNOSIS — M794 Hypertrophy of (infrapatellar) fat pad: Secondary | ICD-10-CM | POA: Diagnosis not present

## 2020-12-20 DIAGNOSIS — H402231 Chronic angle-closure glaucoma, bilateral, mild stage: Secondary | ICD-10-CM | POA: Diagnosis not present

## 2020-12-20 DIAGNOSIS — H402230 Chronic angle-closure glaucoma, bilateral, stage unspecified: Secondary | ICD-10-CM | POA: Diagnosis not present

## 2020-12-20 DIAGNOSIS — R109 Unspecified abdominal pain: Secondary | ICD-10-CM

## 2020-12-20 DIAGNOSIS — H16211 Exposure keratoconjunctivitis, right eye: Secondary | ICD-10-CM | POA: Diagnosis not present

## 2020-12-20 NOTE — Telephone Encounter (Signed)
Pt walked in office stating he needed to be seen. Pt is taking samples of Linzess 72 mcg daily. Pt states his lower abdomen is hurting. Pt states his bowels haven't moved in 3 days and he needs something to make his bowels move. Pt hasn't tried any otc meds to help his bowels move. Pt was advised to go to the ED if he was in a lot of pain. Pt is aware that I'm taking a message for Dr. Gala Romney and will get back with him.

## 2020-12-21 ENCOUNTER — Other Ambulatory Visit: Payer: Self-pay

## 2020-12-21 MED ORDER — LINACLOTIDE 290 MCG PO CAPS: 290.0000 ug | ORAL_CAPSULE | Freq: Every day | ORAL | 11 refills | Status: DC

## 2020-12-21 NOTE — Telephone Encounter (Signed)
Spoke with pt. Pt was notified of Dr. Roseanne Kaufman recommendations. Linzess 290 mcg was sent into pts pharmacy. We don't currently have any Linzess 290 mcg samples.   Routing to [facility] Clinical to arrange scan per Dr. Gala Romney.

## 2020-12-21 NOTE — Telephone Encounter (Addendum)
LMOVM for pt. CTA scheduled for 1/21 at 9:00am, arrival 8:30am, liquids ONLY 4 hrs prior Letter also mailed   [facility] website for PA "Notification/Precertification Requirement: Precertification Not Required"

## 2020-12-21 NOTE — Telephone Encounter (Signed)
Increase Linzess to 290 daily (samples or dispense 30 with 11 refills).  Proceed with a CTA abdomen with reduced contrast to evaluate for mesenteric ischemia.  If CTA cannot be done did go with an MRA of the abdomen.  If MRA is not an option, IR consultation to further evaluate for mesenteric ischemia. Agree with advice to go directly to the ER if severe abdominal pain.

## 2020-12-21 NOTE — Telephone Encounter (Signed)
Routing to [place] to inform pt.

## 2020-12-21 NOTE — Addendum Note (Signed)
Addended by: Cheron Every on: 12/21/2020 11:50 AM   Modules accepted: Orders

## 2021-01-01 DIAGNOSIS — R42 Dizziness and giddiness: Secondary | ICD-10-CM | POA: Diagnosis not present

## 2021-01-01 DIAGNOSIS — Z0000 Encounter for general adult medical examination without abnormal findings: Secondary | ICD-10-CM | POA: Diagnosis not present

## 2021-01-01 DIAGNOSIS — G894 Chronic pain syndrome: Secondary | ICD-10-CM | POA: Diagnosis not present

## 2021-01-01 DIAGNOSIS — E1129 Type 2 diabetes mellitus with other diabetic kidney complication: Secondary | ICD-10-CM | POA: Diagnosis not present

## 2021-01-02 ENCOUNTER — Other Ambulatory Visit: Payer: Self-pay | Admitting: *Deleted

## 2021-01-02 ENCOUNTER — Ambulatory Visit: Payer: Medicare Other | Admitting: Gastroenterology

## 2021-01-02 ENCOUNTER — Other Ambulatory Visit: Payer: Self-pay

## 2021-01-02 ENCOUNTER — Other Ambulatory Visit: Payer: Self-pay | Admitting: Gastroenterology

## 2021-01-02 ENCOUNTER — Encounter: Payer: Self-pay | Admitting: *Deleted

## 2021-01-02 ENCOUNTER — Encounter: Payer: Self-pay | Admitting: Gastroenterology

## 2021-01-02 DIAGNOSIS — R109 Unspecified abdominal pain: Secondary | ICD-10-CM

## 2021-01-02 DIAGNOSIS — R1030 Lower abdominal pain, unspecified: Secondary | ICD-10-CM | POA: Diagnosis not present

## 2021-01-02 NOTE — Addendum Note (Signed)
Addended by: Cheron Every on: 01/02/2021 03:06 PM   Modules accepted: Orders

## 2021-01-02 NOTE — Progress Notes (Signed)
Referring Provider: Redmond School, MD Primary Care Physician:  Redmond School, MD Primary GI: Dr. Gala Romney   Chief Complaint  Patient presents with  . Abdominal Pain    Daily, comes and goes in center of abd. Going on for 6 months    HPI:   [name] is a 75 y.o. male presenting today with a history of chronic GERD, constipation, most recently dealing moreso with abdominal pain. CT abd/pelvis without contrast in Nov 2021 without acute findings. Advanced atherosclerotic disease noted. Plans for CTA already in the works due to concern for chronic mesenteric ischemia.  Waking up with pain in the morning. Linzess daily. Will take a pain pill as well. If not eating on time, stomach will feel like it's gonna kill him. Only eats a small amount of food. No GERD. Has food aversion. Not eating large amounts. No N/V. No dysphagia. BM about daily. About 20 lbs weight loss noted over past year.   Past Medical History:  Diagnosis Date  . Anxiety   . Benign prostatic hypertrophy    Nocturia  . CAD (coronary artery disease)    a.  NSTEMI 10/2012 s/p DES to LAD & DES to 1st diagonal with residual diffuse nonobstructive dz in LCx/RCA   . Chronic kidney disease, stage 3, mod decreased GFR (HCC)    Creatinine of 1.5 in 03/2009, STAGE 4 now  . Degenerative joint disease   . Depression    hx of  . Diabetes mellitus, type II ([place]) 12/10/2012.  . Diastolic heart failure    Pedal edema, "a long time ago"  . Erectile dysfunction   . Flu 2013   hx of  . GERD (gastroesophageal reflux disease)   . Gout   . Hemorrhoids   . High cholesterol   . Hypertension    Exercise induced  . Myocardial infarction ([place])   . NSVT (nonsustained ventricular tachycardia) (HCC)    Exercise induced  . Obesity   . 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 Tubular adenoma 2014    Past Surgical History:  Procedure Laterality Date  . BACK SURGERY  2007   x2  . CARDIAC CATHETERIZATION      with stent placement  . COLONOSCOPY WITH ESOPHAGOGASTRODUODENOSCOPY (EGD) N/A 11/23/2013   Dr. Oneida Alar- TCS= left sided colitis and mild proctitis likely due to ischemic colitis, moderate internal hemorrhoids, small nodule in the rectum, bx=tubular adenoma, EGD=-mild non-erosive gastritis  . COLONOSCOPY WITH PROPOFOL N/A 07/24/2020   hemorrhoids,  rectosigmoid lipoma, small adenoma removed.  Marland Kitchen HERNIA REPAIR    . LEFT HEART CATHETERIZATION WITH CORONARY ANGIOGRAM N/A 11/16/2012   Procedure: LEFT HEART CATHETERIZATION WITH CORONARY ANGIOGRAM;  Surgeon: Sherren Mocha, MD;  Location: [facility] CATH LAB;  Service: Cardiovascular;  Laterality: N/A;  . PERCUTANEOUS CORONARY STENT INTERVENTION (PCI-S)  11/16/2012   Procedure: PERCUTANEOUS CORONARY STENT INTERVENTION (PCI-S);  Surgeon: Sherren Mocha, MD;  Location: [facility] CATH LAB;  Service: Cardiovascular;;  . POLYPECTOMY  07/24/2020   Procedure: POLYPECTOMY;  Surgeon: Daneil Dolin, MD;  [facility];  Service: Endoscopy;;  . TOTAL KNEE ARTHROPLASTY Right 11/15/2013   Procedure: RIGHT TOTAL KNEE ARTHROPLASTY;  Surgeon: Mauri Pole, MD;  Location: [facility] ORS;  Service: Orthopedics;  Laterality: Right;  . TOTAL KNEE ARTHROPLASTY Left 03/07/2015   Procedure: LEFT TOTAL KNEE ARTHROPLASTY;  Surgeon: Paralee Cancel, MD;  Location: [facility];  Service: Orthopedics;  Laterality: Left;    Current Outpatient Medications  Medication Sig Dispense Refill  . acarbose (PRECOSE) 50 MG tablet Take 50 mg by mouth in the morning, at noon, and at bedtime.     Marland Kitchen acetaminophen (TYLENOL) 650 MG CR tablet Take 1,300 mg by mouth as needed.    Marland Kitchen allopurinol (ZYLOPRIM) 100 MG tablet Take 100 mg by mouth daily.    Marland Kitchen amLODipine (NORVASC) 10 MG tablet Take 10 mg by mouth every evening.     Marland Kitchen aspirin EC 81 MG tablet Take 1 tablet (81 mg total) by mouth daily. 90 tablet 3  . bismuth subsalicylate (PEPTO-BISMOL) 262 MG/15ML suspension Take 30 mLs by mouth as needed.    . calcitRIOL  (ROCALTROL) 0.25 MCG capsule Take 0.25 mcg by mouth every Sunday.     . cholecalciferol (VITAMIN D) 1000 units tablet Take 1,000 Units daily by mouth.    . cloNIDine (CATAPRES) 0.1 MG tablet Take 0.1 mg by mouth 2 (two) times daily.    . colchicine 0.6 MG tablet Take 0.6 mg by mouth daily.     Marland Kitchen dicyclomine (BENTYL) 10 MG capsule 3 (three) times daily.    . furosemide (LASIX) 40 MG tablet Take 1 tablet (40 mg total) by mouth 2 (two) times daily. 60 tablet 3  . gabapentin (NEURONTIN) 300 MG capsule Take 300 mg by mouth 3 (three) times daily.     Marland Kitchen HYDROcodone-acetaminophen (NORCO) 10-325 MG tablet Take 1-2 tablets by mouth 4 (four) times daily as needed for moderate pain or severe pain. 10 tablet 0  . insulin NPH Human (NOVOLIN N) 100 UNIT/ML injection Inject 0.22 mLs (22 Units total) into the skin every morning. Ands syringes 1/day 10 mL 11  . linaclotide (LINZESS) 290 MCG CAPS capsule Take 1 capsule (290 mcg total) by mouth daily before breakfast. 30 capsule 11  . meclizine (ANTIVERT) 25 MG tablet Take 25 mg by mouth 3 (three) times daily.    . nitroGLYCERIN (NITROSTAT) 0.4 MG SL tablet PLACE ONE TABLET UNDER THE TONGUE EVERY 5 MINUTES FOR 3 DOSES AS NEEDED 25 tablet 3  . omeprazole (PRILOSEC) 20 MG capsule Take 20 mg by mouth daily.     . pravastatin (PRAVACHOL) 20 MG tablet Take 1 tablet (20 mg total) by mouth every evening. 90 tablet 3  . TRUE METRIX BLOOD GLUCOSE TEST test strip USE TO TEST BLOOD SUGAR UP TO TWICE DAILY OR AS DIRECTED 180 each 3   No current facility-administered medications for this visit.    Allergies as of 01/02/2021 - Review Complete 01/02/2021  Allergen Reaction Noted  . Niacin and related Other (See Comments) 10/24/2012    Family History  Problem Relation Age of Onset  . Hypertension Other   . Cancer Other   . Heart disease Other   . Diabetes Other   . Hypertension Mother   . Cancer Father        brain  . CAD Brother   . Colon cancer Neg Hx     Social  History   Socioeconomic History  . Marital status: Widowed    Spouse name: Not on file  . Number of children: Not on file  . Years of education: Not on file  . Highest education level: Not on file  Occupational History  . Occupation: Disabled    Employer: RETIRED  Tobacco Use  . Smoking status: Former Smoker    Packs/day: 1.00    Years: 30.00    Pack years: 30.00    Types: Cigarettes    Start date:  01/22/1965    Quit date: 12/23/1992    Years since quitting: 28.0  . Smokeless tobacco: Never Used  Vaping Use  . Vaping Use: Never used  Substance and Sexual Activity  . Alcohol use: No    Alcohol/week: 0.0 standard drinks  . Drug use: No  . Sexual activity: Not Currently  Other Topics Concern  . Not on file  Social History Narrative   Married   Right handed   One story home no caffeine   Social Determinants of Health   Financial Resource Strain: Not on file  Food Insecurity: Not on file  Transportation Needs: Not on file  Physical Activity: Not on file  Stress: Not on file  Social Connections: Not on file    Review of Systems: Gen: Denies fever, chills, anorexia. Denies fatigue, weakness, weight loss.  CV: Denies chest pain, palpitations, syncope, peripheral edema, and claudication. Resp: Denies dyspnea at rest, cough, wheezing, coughing up blood, and pleurisy. GI: see HPI Derm: Denies rash, itching, dry skin Psych: Denies depression, anxiety, memory loss, confusion. No homicidal or suicidal ideation.  Heme: Denies bruising, bleeding, and enlarged lymph nodes.  Physical Exam: BP 136/69   Pulse 71   Temp (!) 97.5 F (36.4 C) (Temporal)   Ht 6' 2.5" (1.892 m)   Wt 271 lb 6.4 oz (123.1 kg)   BMI 34.38 kg/m  General:   Alert and oriented. No distress noted. Pleasant and cooperative.  Head:  Normocephalic and atraumatic. Eyes:  Conjuctiva clear without scleral icterus. Mouth:  Mask in place Abdomen:  +BS, soft, non-tender and non-distended. No rebound or  guarding. No HSM or masses noted. Msk:  Symmetrical without gross deformities. Normal posture. Extremities:  Without edema. Neurologic:  Alert and  oriented x4 Psych:  Alert and cooperative. Normal mood and affect.  ASSESSMENT/PLAN: [name] is a 75 y.o. male presenting today with history of chronic GERD, constipation, now with at least 6 month history of abdominal pain and documented weight loss.  Concerns for chronic mesenteric ischemia. We will move up the CTA to this week. He may need IV dosage adjustment. BMP prior.   Continue Linzess 290 mcg once daily.  If CTA is negative, will pursue EGD.  Patient was counseled to go to the ED if acute pain noted.   Annitta Needs, PhD, ANP-BC [facility]

## 2021-01-02 NOTE — Patient Instructions (Addendum)
We are arranging a special CT scan for this week to look at the blood vessels that feed your intestines. I am concerned there may be issues with the flow to your intestines, which can cause pain.   IF this scan is negative, we will pursue an upper endoscopy and see if anything is going in your stomach (such as ulcers).   Further recommendations to follow!  It was a pleasure to see you today. I want to create trusting relationships with patients to provide genuine, compassionate, and quality care. I value your feedback. If you receive a survey regarding your visit,  I greatly appreciate you taking time to fill this out.   Annitta Needs, PhD, ANP-BC [facility]

## 2021-01-03 ENCOUNTER — Telehealth: Payer: Self-pay | Admitting: Internal Medicine

## 2021-01-03 ENCOUNTER — Ambulatory Visit (HOSPITAL_COMMUNITY)
Admission: RE | Admit: 2021-01-03 | Discharge: 2021-01-03 | Disposition: A | Discharge status: Home / Self Care | Payer: Medicare Other | Source: Ambulatory Visit | Attending: Gastroenterology | Admitting: Gastroenterology

## 2021-01-03 DIAGNOSIS — M109 Gout, unspecified: Secondary | ICD-10-CM | POA: Diagnosis not present

## 2021-01-03 DIAGNOSIS — R1030 Lower abdominal pain, unspecified: Secondary | ICD-10-CM | POA: Diagnosis not present

## 2021-01-03 DIAGNOSIS — I70203 Unspecified atherosclerosis of native arteries of extremities, bilateral legs: Secondary | ICD-10-CM | POA: Diagnosis not present

## 2021-01-03 DIAGNOSIS — I708 Atherosclerosis of other arteries: Secondary | ICD-10-CM | POA: Diagnosis not present

## 2021-01-03 DIAGNOSIS — I701 Atherosclerosis of renal artery: Secondary | ICD-10-CM | POA: Diagnosis not present

## 2021-01-03 DIAGNOSIS — I10 Essential (primary) hypertension: Secondary | ICD-10-CM | POA: Diagnosis not present

## 2021-01-03 DIAGNOSIS — E1140 Type 2 diabetes mellitus with diabetic neuropathy, unspecified: Secondary | ICD-10-CM | POA: Diagnosis not present

## 2021-01-03 DIAGNOSIS — M1991 Primary osteoarthritis, unspecified site: Secondary | ICD-10-CM | POA: Diagnosis not present

## 2021-01-03 DIAGNOSIS — N281 Cyst of kidney, acquired: Secondary | ICD-10-CM | POA: Diagnosis not present

## 2021-01-03 DIAGNOSIS — N184 Chronic kidney disease, stage 4 (severe): Secondary | ICD-10-CM | POA: Diagnosis not present

## 2021-01-03 LAB — BASIC METABOLIC PANEL (7)
BUN/Creatinine Ratio: 19 (ref 10–24)
BUN: 35 mg/dL — ABNORMAL HIGH (ref 8–27)
CO2: 21 mmol/L (ref 20–29)
Chloride: 100 mmol/L (ref 96–106)
Creatinine, Ser: 1.87 mg/dL — ABNORMAL HIGH (ref 0.76–1.27)
GFR calc Af Amer: 40 mL/min/{1.73_m2} — ABNORMAL LOW (ref >59)
GFR calc non Af Amer: 34 mL/min/{1.73_m2} — ABNORMAL LOW (ref >59)
Glucose: 174 mg/dL — ABNORMAL HIGH (ref 65–99)
Potassium: 3.5 mmol/L (ref 3.5–5.2)
Sodium: 145 mmol/L — ABNORMAL HIGH (ref 134–144)

## 2021-01-03 MED ORDER — IOHEXOL 350 MG/ML SOLN
80.0000 mL | Freq: Once | INTRAVENOUS | Status: AC | PRN
  Administered 2021-01-03: 80 mL via INTRAVENOUS

## 2021-01-03 NOTE — Telephone Encounter (Signed)
Pt can't remember if he is to take his medications today. Please advise. [phone]

## 2021-01-03 NOTE — Telephone Encounter (Signed)
Please address with patient.

## 2021-01-03 NOTE — Telephone Encounter (Signed)
Lmom for pt to call me back. 

## 2021-01-04 ENCOUNTER — Other Ambulatory Visit: Payer: Self-pay | Admitting: Physician Assistant

## 2021-01-04 ENCOUNTER — Other Ambulatory Visit (HOSPITAL_COMMUNITY): Payer: Self-pay | Admitting: Physician Assistant

## 2021-01-04 DIAGNOSIS — R42 Dizziness and giddiness: Secondary | ICD-10-CM

## 2021-01-05 ENCOUNTER — Telehealth: Payer: Self-pay

## 2021-01-05 NOTE — Telephone Encounter (Signed)
PA for EGD submitted via [facility] website. PA# [id number], valid 01/25/21-04/25/21.

## 2021-01-10 ENCOUNTER — Telehealth: Payer: Self-pay | Admitting: Internal Medicine

## 2021-01-10 MED ORDER — ONDANSETRON HCL 4 MG PO TABS: 4.0000 mg | ORAL_TABLET | Freq: Three times a day (TID) | ORAL | 1 refills | Status: DC | PRN

## 2021-01-10 MED ORDER — PANTOPRAZOLE SODIUM 40 MG PO TBEC: 40.0000 mg | DELAYED_RELEASE_TABLET | Freq: Two times a day (BID) | ORAL | 3 refills | Status: DC

## 2021-01-10 NOTE — Telephone Encounter (Signed)
PLEASE CALL PATIENT, HE WANTS TO KNOW WHEN HIS ENDO IS

## 2021-01-10 NOTE — Addendum Note (Signed)
Addended by: Annitta Needs on: 01/10/2021 10:46 AM   Modules accepted: Orders

## 2021-01-10 NOTE — Telephone Encounter (Signed)
Spoke with pt. See other phone note. Call has been addressed.

## 2021-01-10 NOTE — Telephone Encounter (Signed)
Called patient. Made aware he is scheduled for procedure 2/3 (2 weeks). He states "I can't wait that long someone needs to do something now". He states " I am very sick and no one is doing anything about it". He then stated he is taking linzess 23mg twice a day and wants to know if he can take it 3 times per day. He states he is nauseated all the time, stomach hurts all day and all night, stomach rolling all day, can't eat anything.   Please advise AVicente Malesthanks

## 2021-01-10 NOTE — Telephone Encounter (Signed)
Spoke with pt. Pt isn't constipated at this time. Pt was asked to take 1 Linzess pill once daily instead of three pills daily. Pt was asked to d/c Omeprazole and start Pantoprazole 30 mg before breakfast and dinner. Pt is also aware that Zofram was sent to his pharmacy to help with nausea. Pt isn't taking Bentyl at this time.

## 2021-01-10 NOTE — Telephone Encounter (Signed)
I would like to change his omeprazole. Let's do Protonix, and take 30 minutes before breakfast and dinner. I am sending this to [facility]. I have also sent in Zofran to take for nausea every 6-8 hours as needed.  Linzess is only indicated for once a day. Is he constipated?? Hold off on taking Bentyl, as this can worsen symptoms. If linzess not helping, we need to adjust meds.

## 2021-01-12 ENCOUNTER — Ambulatory Visit (HOSPITAL_COMMUNITY): Payer: Medicare Other

## 2021-01-16 DIAGNOSIS — S39012A Strain of muscle, fascia and tendon of lower back, initial encounter: Secondary | ICD-10-CM | POA: Diagnosis not present

## 2021-01-18 ENCOUNTER — Ambulatory Visit (HOSPITAL_COMMUNITY)
Admission: RE | Admit: 2021-01-18 | Discharge: 2021-01-18 | Disposition: A | Discharge status: Home / Self Care | Payer: Medicare Other | Source: Ambulatory Visit | Attending: Physician Assistant | Admitting: Physician Assistant

## 2021-01-18 ENCOUNTER — Other Ambulatory Visit: Payer: Self-pay

## 2021-01-18 DIAGNOSIS — R42 Dizziness and giddiness: Secondary | ICD-10-CM

## 2021-01-19 ENCOUNTER — Ambulatory Visit: Payer: Medicare Other | Admitting: Cardiology

## 2021-01-19 ENCOUNTER — Encounter: Payer: Self-pay | Admitting: Cardiology

## 2021-01-19 VITALS — BP 136/88 | HR 65 | Ht 74.5 in | Wt 271.0 lb

## 2021-01-19 DIAGNOSIS — I2510 Atherosclerotic heart disease of native coronary artery without angina pectoris: Secondary | ICD-10-CM | POA: Diagnosis not present

## 2021-01-19 DIAGNOSIS — I10 Essential (primary) hypertension: Secondary | ICD-10-CM

## 2021-01-19 DIAGNOSIS — E782 Mixed hyperlipidemia: Secondary | ICD-10-CM

## 2021-01-19 NOTE — Progress Notes (Signed)
Clinical Summary [name] is a 75 y.o.male seen today for follow up of the following medical problems.   1. CAD NSTEMI on 11/16/2012 and received a drug-eluting stent to the LAD and D1, with residual diffuse nonobstructive disease in the RCA and LCx  - bradycardia on metoprolol, now off - no recent chest pain. No SOB or DOE - exercises regularly, elliptical x 30 min 3-4 times a week with no exertional symptoms - compliant with meds  2. HTN - compliant with meds   3. Hyperlipidemia - labs followed by pcp  4. CKD - followed by Dr Theador Hawthorne   5. OSA  6. Chronic diastolic HF - no recent edema       Has had covid vaccine x 3 Past Medical History:  Diagnosis Date  . Anxiety   . Benign prostatic hypertrophy    Nocturia  . CAD (coronary artery disease)    a.  NSTEMI 10/2012 s/p DES to LAD & DES to 1st diagonal with residual diffuse nonobstructive dz in LCx/RCA   . Chronic kidney disease, stage 3, mod decreased GFR (HCC)    Creatinine of 1.5 in 03/2009, STAGE 4 now  . Degenerative joint disease   . Depression    hx of  . Diabetes mellitus, type II ([place]) 12/10/2012.  . Diastolic heart failure    Pedal edema, "a long time ago"  . Erectile dysfunction   . Flu 2013   hx of  . GERD (gastroesophageal reflux disease)   . Gout   . Hemorrhoids   . High cholesterol   . Hypertension    Exercise induced  . Myocardial infarction ([place])   . NSVT (nonsustained ventricular tachycardia) (HCC)    Exercise induced  . Obesity   . 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 Tubular adenoma 2014     Allergies  Allergen Reactions  . Niacin And Related Other (See Comments)    Reaction: flushing and burning side effects even with extended release     Current Outpatient Medications  Medication Sig Dispense Refill  . acarbose (PRECOSE) 50 MG tablet Take 50 mg by mouth in the morning, at noon, and at bedtime.     Marland Kitchen acetaminophen (TYLENOL) 650 MG CR tablet  Take 1,300 mg by mouth every 8 (eight) hours as needed for pain.    Marland Kitchen allopurinol (ZYLOPRIM) 100 MG tablet Take 100 mg by mouth daily.    Marland Kitchen amLODipine (NORVASC) 10 MG tablet Take 10 mg by mouth every evening.     Marland Kitchen aspirin EC 81 MG tablet Take 1 tablet (81 mg total) by mouth daily. 90 tablet 3  . calcitRIOL (ROCALTROL) 0.25 MCG capsule Take 0.25 mcg by mouth every Sunday.     . cholecalciferol (VITAMIN D) 1000 units tablet Take 1,000 Units daily by mouth.    . cloNIDine (CATAPRES) 0.1 MG tablet Take 0.1 mg by mouth 3 (three) times daily.    Marland Kitchen dicyclomine (BENTYL) 10 MG capsule Take 10 mg by mouth 3 (three) times daily.    . furosemide (LASIX) 40 MG tablet Take 1 tablet (40 mg total) by mouth 2 (two) times daily. 60 tablet 3  . gabapentin (NEURONTIN) 300 MG capsule Take 300 mg by mouth 3 (three) times daily.     Marland Kitchen HYDROcodone-acetaminophen (NORCO) 10-325 MG tablet Take 1-2 tablets by mouth 4 (four) times daily as needed for moderate pain or severe pain. 10 tablet 0  . insulin  NPH Human (NOVOLIN N) 100 UNIT/ML injection Inject 0.22 mLs (22 Units total) into the skin every morning. Ands syringes 1/day (Patient taking differently: Inject 21-22 Units into the skin every morning. Ands syringes 1/day) 10 mL 11  . linaclotide (LINZESS) 290 MCG CAPS capsule Take 1 capsule (290 mcg total) by mouth daily before breakfast. 30 capsule 11  . losartan (COZAAR) 25 MG tablet Take 25 mg by mouth daily.    . meclizine (ANTIVERT) 25 MG tablet Take 25 mg by mouth 3 (three) times daily as needed for dizziness.    . nitroGLYCERIN (NITROSTAT) 0.4 MG SL tablet PLACE ONE TABLET UNDER THE TONGUE EVERY 5 MINUTES FOR 3 DOSES AS NEEDED (Patient taking differently: Place 0.4 mg under the tongue every 5 (five) minutes as needed for chest pain.) 25 tablet 3  . ondansetron (ZOFRAN) 4 MG tablet Take 1 tablet (4 mg total) by mouth every 8 (eight) hours as needed for nausea or vomiting. 30 tablet 1  . pantoprazole (PROTONIX) 40 MG  tablet Take 1 tablet (40 mg total) by mouth 2 (two) times daily before a meal. 60 tablet 3  . pravastatin (PRAVACHOL) 20 MG tablet Take 1 tablet (20 mg total) by mouth every evening. 90 tablet 3  . Probiotic Product (PROBIOTIC DAILY PO) Take 1 capsule by mouth daily.    . TRUE METRIX BLOOD GLUCOSE TEST test strip USE TO TEST BLOOD SUGAR UP TO TWICE DAILY OR AS DIRECTED 180 each 3   No current facility-administered medications for this visit.     Past Surgical History:  Procedure Laterality Date  . BACK SURGERY  2007   x2  . CARDIAC CATHETERIZATION     with stent placement  . COLONOSCOPY WITH ESOPHAGOGASTRODUODENOSCOPY (EGD) N/A 11/23/2013   Dr. Oneida Alar- TCS= left sided colitis and mild proctitis likely due to ischemic colitis, moderate internal hemorrhoids, small nodule in the rectum, bx=tubular adenoma, EGD=-mild non-erosive gastritis  . COLONOSCOPY WITH PROPOFOL N/A 07/24/2020   hemorrhoids,  rectosigmoid lipoma, small adenoma removed.  Marland Kitchen HERNIA REPAIR    . LEFT HEART CATHETERIZATION WITH CORONARY ANGIOGRAM N/A 11/16/2012   Procedure: LEFT HEART CATHETERIZATION WITH CORONARY ANGIOGRAM;  Surgeon: Sherren Mocha, MD;  Location: [facility] CATH LAB;  Service: Cardiovascular;  Laterality: N/A;  . PERCUTANEOUS CORONARY STENT INTERVENTION (PCI-S)  11/16/2012   Procedure: PERCUTANEOUS CORONARY STENT INTERVENTION (PCI-S);  Surgeon: Sherren Mocha, MD;  Location: [facility] CATH LAB;  Service: Cardiovascular;;  . POLYPECTOMY  07/24/2020   Procedure: POLYPECTOMY;  Surgeon: Daneil Dolin, MD;  Location: [facility] ENDO SUITE;  Service: Endoscopy;;  . TOTAL KNEE ARTHROPLASTY Right 11/15/2013   Procedure: RIGHT TOTAL KNEE ARTHROPLASTY;  Surgeon: Mauri Pole, MD;  Location: [facility] ORS;  Service: Orthopedics;  Laterality: Right;  . TOTAL KNEE ARTHROPLASTY Left 03/07/2015   Procedure: LEFT TOTAL KNEE ARTHROPLASTY;  Surgeon: Paralee Cancel, MD;  Location: [facility] ORS;  Service: Orthopedics;  Laterality: Left;     Allergies  Allergen  Reactions  . Niacin And Related Other (See Comments)    Reaction: flushing and burning side effects even with extended release      Family History  Problem Relation Age of Onset  . Hypertension Other   . Cancer Other   . Heart disease Other   . Diabetes Other   . Hypertension Mother   . Cancer Father        brain  . CAD Brother   . Colon cancer Neg Hx      Social History [name]  reports that he quit smoking about 28 years ago. His smoking use included cigarettes. He started smoking about 56 years ago. He has a 30.00 pack-year smoking history. He has never used smokeless tobacco. [name] reports no history of alcohol use.   Review of Systems CONSTITUTIONAL: No weight loss, fever, chills, weakness or fatigue.  HEENT: Eyes: No visual loss, blurred vision, double vision or yellow sclerae.No hearing loss, sneezing, congestion, runny nose or sore throat.  SKIN: No rash or itching.  CARDIOVASCULAR: per hpi RESPIRATORY: No shortness of breath, cough or sputum.  GASTROINTESTINAL: No anorexia, nausea, vomiting or diarrhea. No abdominal pain or blood.  GENITOURINARY: No burning on urination, no polyuria NEUROLOGICAL: No headache, dizziness, syncope, paralysis, ataxia, numbness or tingling in the extremities. No change in bowel or bladder control.  MUSCULOSKELETAL: No muscle, back pain, joint pain or stiffness.  LYMPHATICS: No enlarged nodes. No history of splenectomy.  PSYCHIATRIC: No history of depression or anxiety.  ENDOCRINOLOGIC: No reports of sweating, cold or heat intolerance. No polyuria or polydipsia.  Marland Kitchen   Physical Examination Today's Vitals   01/19/21 1340  BP: 136/88  Pulse: 65  SpO2: 97%  Weight: 271 lb (122.9 kg)  Height: 6' 2.5" (1.892 m)   Body mass index is 34.33 kg/m.  Gen: resting comfortably, no acute distress HEENT: no scleral icterus, pupils equal round and reactive, no palptable cervical adenopathy,  CV: RRR, no m/r/g, no jvd Resp: Clear to  auscultation bilaterally GI: abdomen is soft, non-tender, non-distended, normal bowel sounds, no hepatosplenomegaly MSK: extremities are warm, no edema.  Skin: warm, no rash Neuro:  no focal deficits Psych: appropriate affect   Diagnostic Studies 03/2020 echo IMPRESSIONS    1. Left ventricular ejection fraction, by estimation, is 55 to 60%. The  left ventricle has normal function. The left ventricle has no regional  wall motion abnormalities. There is mild left ventricular hypertrophy.  Left ventricular diastolic parameters  are consistent with Grade I diastolic dysfunction (impaired relaxation).  2. Right ventricular systolic function is normal. The right ventricular  size is normal.  3. Left atrial size was mildly dilated.  4. The mitral valve is normal in structure. No evidence of mitral valve  regurgitation. No evidence of mitral stenosis.  5. The aortic valve is tricuspid. Aortic valve regurgitation is not  visualized. No aortic stenosis is present.    Jan 2022 CTA IMPRESSION: Mesenteric arterial disease is present, however, the CT does not support chronic mesenteric ischemia as a source of abdominal pain given all 3 mesenteric arteries are patent, and the greatest degree of narrowing is estimated 50% of the proximal SMA.  Bilateral renal arterial disease, worst on the right where there appears to be high-grade stenosis and associated asymmetric right renal parenchyma.  Aortic Atherosclerosis (ICD10-I70.0).  Mild bilateral iliac arterial disease without high-grade stenosis or occlusion.    Assessment and Plan   1. CAD - no recent symptoms, continue current meds  2. HTN - at goal, continue current meds  3. Hyperlipidemia - request labs from pcp, continue statin   F/u 6 months  Arnoldo Lenis, M.D.

## 2021-01-19 NOTE — Patient Instructions (Signed)
[name]  01/19/2021     @PREFPERIOPPHARMACY @   Your procedure is scheduled on  01/25/2021.    Report to [facility] at  0700  A.M.   Call this number if you have problems the morning of surgery:  [phone]   Remember:  Follow the diet instructions given to you by the office.                     Take these medicines the morning of surgery with A SIP OF WATER             Allopurinol, amlodipine, clonidine, bentyl, gabapentin, hydrocodone(If needed), antivert (if needed), zofran (if needed).       DO NOT take any medication for diabetes the morning of your procedure.    Please brush your teeth.  Do not wear jewelry, make-up or nail polish.  Do not wear lotions, powders, or perfumes, or deodorant.  Do not shave 48 hours prior to surgery.  Men may shave face and neck.  Do not bring valuables to the hospital.  [facility] is not responsible for any belongings or valuables.  Contacts, dentures or bridgework may not be worn into surgery.  Leave your suitcase in the car.  After surgery it may be brought to your room.  For patients admitted to the hospital, discharge time will be determined by your treatment team.  Patients discharged the day of surgery will not be allowed to drive home and must have someone with them for 24 hours.   Special instructions:   DO NOT smoke tobacco or vape the morning of your procedure.   Please read over the following fact sheets that you were given. Anesthesia Post-op Instructions and Care and Recovery After Surgery       Upper Endoscopy, Adult, Care After This sheet gives you information about how to care for yourself after your procedure. Your health care provider may also give you more specific instructions. If you have problems or questions, contact your health care provider. What can I expect after the procedure? After the procedure, it is common to have:  A sore throat.  Mild stomach pain or  discomfort.  Bloating.  Nausea. Follow these instructions at home:  Follow instructions from your health care provider about what to eat or drink after your procedure.  Return to your normal activities as told by your health care provider. Ask your health care provider what activities are safe for you.  Take over-the-counter and prescription medicines only as told by your health care provider.  If you were given a sedative during the procedure, it can affect you for several hours. Do not drive or operate machinery until your health care provider says that it is safe.  Keep all follow-up visits as told by your health care provider. This is important.   Contact a health care provider if you have:  A sore throat that lasts longer than one day.  Trouble swallowing. Get help right away if:  You vomit blood or your vomit looks like coffee grounds.  You have: ? A fever. ? Bloody, black, or tarry stools. ? A severe sore throat or you cannot swallow. ? Difficulty breathing. ? Severe pain in your chest or abdomen. Summary  After the procedure, it is common to have a sore throat, mild stomach discomfort, bloating, and nausea.  If you were given a sedative during the procedure, it can affect you for several  hours. Do not drive or operate machinery until your health care provider says that it is safe.  Follow instructions from your health care provider about what to eat or drink after your procedure.  Return to your normal activities as told by your health care provider. This information is not intended to replace advice given to you by your health care provider. Make sure you discuss any questions you have with your health care provider. Document Revised: 12/07/2019 Document Reviewed: 05/11/2018 Elsevier Patient Education  2021 [place] After This sheet gives you information about how to care for yourself after your procedure. Your health care  provider may also give you more specific instructions. If you have problems or questions, contact your health care provider. What can I expect after the procedure? After the procedure, it is common to have:  Tiredness.  Forgetfulness about what happened after the procedure.  Impaired judgment for important decisions.  Nausea or vomiting.  Some difficulty with balance. Follow these instructions at home: For the time period you were told by your health care provider:  Rest as needed.  Do not participate in activities where you could fall or become injured.  Do not drive or use machinery.  Do not drink alcohol.  Do not take sleeping pills or medicines that cause drowsiness.  Do not make important decisions or sign legal documents.  Do not take care of children on your own.      Eating and drinking  Follow the diet that is recommended by your health care provider.  Drink enough fluid to keep your urine pale yellow.  If you vomit: ? Drink water, juice, or soup when you can drink without vomiting. ? Make sure you have little or no nausea before eating solid foods. General instructions  Have a responsible adult stay with you for the time you are told. It is important to have someone help care for you until you are awake and alert.  Take over-the-counter and prescription medicines only as told by your health care provider.  If you have sleep apnea, surgery and certain medicines can increase your risk for breathing problems. Follow instructions from your health care provider about wearing your sleep device: ? Anytime you are sleeping, including during daytime naps. ? While taking prescription pain medicines, sleeping medicines, or medicines that make you drowsy.  Avoid smoking.  Keep all follow-up visits as told by your health care provider. This is important. Contact a health care provider if:  You keep feeling nauseous or you keep vomiting.  You feel  light-headed.  You are still sleepy or having trouble with balance after 24 hours.  You develop a rash.  You have a fever.  You have redness or swelling around the IV site. Get help right away if:  You have trouble breathing.  You have new-onset confusion at home. Summary  For several hours after your procedure, you may feel tired. You may also be forgetful and have poor judgment.  Have a responsible adult stay with you for the time you are told. It is important to have someone help care for you until you are awake and alert.  Rest as told. Do not drive or operate machinery. Do not drink alcohol or take sleeping pills.  Get help right away if you have trouble breathing, or if you suddenly become confused. This information is not intended to replace advice given to you by your health care provider. Make sure you discuss any questions  you have with your health care provider. Document Revised: 08/24/2020 Document Reviewed: 11/11/2019 Elsevier Patient Education  2021 [employer].

## 2021-01-19 NOTE — Patient Instructions (Signed)
Medication Instructions:  Your physician recommends that you continue on your current medications as directed. Please refer to the Current Medication list given to you today.  *If you need a refill on your cardiac medications before your next appointment, please call your pharmacy*   Lab Work: None today If you have labs (blood work) drawn today and your tests are completely normal, you will receive your results only by: Marland Kitchen MyChart Message (if you have MyChart) OR . A paper copy in the mail If you have any lab test that is abnormal or we need to change your treatment, we will call you to review the results.   Testing/Procedures: None today   Follow-Up: At [facility], you and your health needs are our priority.  As part of our continuing mission to provide you with exceptional heart care, we have created designated Provider Care Teams.  These Care Teams include your primary Cardiologist (physician) and Advanced Practice Providers (APPs -  Physician Assistants and Nurse Practitioners) who all work together to provide you with the care you need, when you need it.  We recommend signing up for the patient portal called "MyChart".  Sign up information is provided on this After Visit Summary.  MyChart is used to connect with patients for Virtual Visits (Telemedicine).  Patients are able to view lab/test results, encounter notes, upcoming appointments, etc.  Non-urgent messages can be sent to your provider as well.   To learn more about what you can do with MyChart, go to [email or web address].    Your next appointment:   6 month(s)  The format for your next appointment:   In Person  Provider:   Carlyle Dolly, MD   Other Instructions None       Thank you for choosing [facility] !

## 2021-01-22 ENCOUNTER — Telehealth: Payer: Self-pay

## 2021-01-22 NOTE — Telephone Encounter (Signed)
EGD w/Propofol w/Dr. Gala Romney ASA 3 for 01/25/21 has to be rescheduled per endo.  Tried to call pt, LMOVM for return call.

## 2021-01-23 ENCOUNTER — Telehealth: Payer: Self-pay

## 2021-01-23 ENCOUNTER — Other Ambulatory Visit (HOSPITAL_COMMUNITY): Payer: Medicare Other | Attending: Internal Medicine

## 2021-01-23 ENCOUNTER — Encounter (HOSPITAL_COMMUNITY)
Admission: RE | Admit: 2021-01-23 | Discharge: 2021-01-23 | Disposition: A | Discharge status: Home / Self Care | Payer: Medicare Other | Source: Ambulatory Visit | Attending: Internal Medicine | Admitting: Internal Medicine

## 2021-01-23 ENCOUNTER — Encounter (HOSPITAL_COMMUNITY): Payer: Self-pay

## 2021-01-23 NOTE — Telephone Encounter (Signed)
Tried to call pt, LMOVM for return call.

## 2021-01-23 NOTE — Telephone Encounter (Signed)
Pts concerns noted.  Endo schedule is a moving target.  Lets see if something opens up next week

## 2021-01-23 NOTE — Telephone Encounter (Signed)
Spoke with pt. Pt is aware that Dr. Gala Romney is aware of his concerns and that we will notify him if something comes available next week per Dr. Gala Romney.

## 2021-01-23 NOTE — Telephone Encounter (Signed)
Lmom, waiting on a return call.  

## 2021-01-23 NOTE — Telephone Encounter (Signed)
Pt came in office, states he can't wait till March for procedure d/t having problems with stomach. Pt directed to Dr. Roseanne Kaufman nurse re: problems.

## 2021-01-23 NOTE — Telephone Encounter (Signed)
Pt walked in office and spkoe with with Tretha Sciara about his procedure being canceled. Tretha Sciara asked me to speak with pt. Per pt he can't wait until march to have his procedure. Pt understands that due to hospital policy at this time his procedure was canceled. Pt would like to know if another facility could do his procedure due to the pain he has constantly daily. Pt is taking Linzess 290 mcg qam and pts bowels move qod and pt isn't eating much. Pt understands that his food intake isn't much and he doesn't have a BM daily. Pt is eating light soups and is scared to eat due to the mid lower abdominal pain.

## 2021-01-24 ENCOUNTER — Encounter (HOSPITAL_COMMUNITY): Payer: Self-pay | Admitting: Anesthesiology

## 2021-01-24 NOTE — Telephone Encounter (Signed)
[name], would it be ok to see if pt can do EGD 2/10?

## 2021-01-24 NOTE — Telephone Encounter (Signed)
Endo scheduler is aware procedure is cancelled for tomorrow. Awaiting ok for next week scheduling.

## 2021-01-25 DIAGNOSIS — N189 Chronic kidney disease, unspecified: Secondary | ICD-10-CM | POA: Diagnosis not present

## 2021-01-25 DIAGNOSIS — I5032 Chronic diastolic (congestive) heart failure: Secondary | ICD-10-CM | POA: Diagnosis not present

## 2021-01-25 DIAGNOSIS — E1129 Type 2 diabetes mellitus with other diabetic kidney complication: Secondary | ICD-10-CM | POA: Diagnosis not present

## 2021-01-25 DIAGNOSIS — R809 Proteinuria, unspecified: Secondary | ICD-10-CM | POA: Diagnosis not present

## 2021-01-25 DIAGNOSIS — I129 Hypertensive chronic kidney disease with stage 1 through stage 4 chronic kidney disease, or unspecified chronic kidney disease: Secondary | ICD-10-CM | POA: Diagnosis not present

## 2021-01-25 DIAGNOSIS — E876 Hypokalemia: Secondary | ICD-10-CM | POA: Diagnosis not present

## 2021-01-25 DIAGNOSIS — E1122 Type 2 diabetes mellitus with diabetic chronic kidney disease: Secondary | ICD-10-CM | POA: Diagnosis not present

## 2021-01-25 NOTE — Telephone Encounter (Signed)
Patient needs to be done the week of 2/14.

## 2021-01-25 NOTE — Telephone Encounter (Signed)
Tried to call pt, LMOVM for return call.

## 2021-01-26 ENCOUNTER — Ambulatory Visit: Payer: Medicare Other | Admitting: Endocrinology

## 2021-01-29 NOTE — Telephone Encounter (Signed)
Tried to call pt no answer, LMOVM for return call.

## 2021-01-30 ENCOUNTER — Telehealth: Payer: Self-pay | Admitting: Internal Medicine

## 2021-01-30 NOTE — Telephone Encounter (Signed)
Spoke to pt (letter discarded), EGD rescheduled to 02/05/21--AM (diabetic). Endo scheduler informed.

## 2021-01-30 NOTE — Patient Instructions (Signed)
[name]  01/30/2021     @PREFPERIOPPHARMACY @   Your procedure is scheduled on  02/05/2021.   Report to [facility] at  0700  A.M.  Call this number if you have problems the morning of surgery:  [phone]   Remember:  Follow the diet instructions given to you by the office.                      Take these medicines the morning of surgery with A SIP OF WATER    Allopurinol, amlodipine, clonidine, gabapentin, hydrocodone (if needed), zofran (if needed), protonix.             DO NOT take any medications for diabetes the morning of your procedure.          If your glucose is below 70 the morning of your procedure, drink 1/2 cup of clear juice and recheck your glucose in 15 minutes. If your glucose is not above 70, call [phone] for instructions.        If your glucose is over 300 the morning of your procedure, call [phone] for instructions.               Please brush your teeth.  Do not wear jewelry, make-up or nail polish.  Do not wear lotions, powders, or perfumes, or deodorant.  Do not shave 48 hours prior to surgery.  Men may shave face and neck.  Do not bring valuables to the hospital.  [facility] is not responsible for any belongings or valuables.  Contacts, dentures or bridgework may not be worn into surgery.  Leave your suitcase in the car.  After surgery it may be brought to your room.  For patients admitted to the hospital, discharge time will be determined by your treatment team.  Patients discharged the day of surgery will not be allowed to drive home and must have somone with them for 24 hours.   Special instructions:   DO NOT smoke tobacco or vape the morning of your procedure.    Please read over the following fact sheets that you were given. Anesthesia Post-op Instructions and Care and Recovery After Surgery      Upper Endoscopy, Adult, Care After This sheet gives you information about how to care for yourself after your procedure.  Your health care provider may also give you more specific instructions. If you have problems or questions, contact your health care provider. What can I expect after the procedure? After the procedure, it is common to have:  A sore throat.  Mild stomach pain or discomfort.  Bloating.  Nausea. Follow these instructions at home:  Follow instructions from your health care provider about what to eat or drink after your procedure.  Return to your normal activities as told by your health care provider. Ask your health care provider what activities are safe for you.  Take over-the-counter and prescription medicines only as told by your health care provider.  If you were given a sedative during the procedure, it can affect you for several hours. Do not drive or operate machinery until your health care provider says that it is safe.  Keep all follow-up visits as told by your health care provider. This is important.   Contact a health care provider if you have:  A sore throat that lasts longer than one day.  Trouble swallowing. Get help right away if:  You vomit blood or your vomit looks like coffee  grounds.  You have: ? A fever. ? Bloody, black, or tarry stools. ? A severe sore throat or you cannot swallow. ? Difficulty breathing. ? Severe pain in your chest or abdomen. Summary  After the procedure, it is common to have a sore throat, mild stomach discomfort, bloating, and nausea.  If you were given a sedative during the procedure, it can affect you for several hours. Do not drive or operate machinery until your health care provider says that it is safe.  Follow instructions from your health care provider about what to eat or drink after your procedure.  Return to your normal activities as told by your health care provider. This information is not intended to replace advice given to you by your health care provider. Make sure you discuss any questions you have with your health  care provider. Document Revised: 12/07/2019 Document Reviewed: 05/11/2018 Elsevier Patient Education  2021 [place] After This sheet gives you information about how to care for yourself after your procedure. Your health care provider may also give you more specific instructions. If you have problems or questions, contact your health care provider. What can I expect after the procedure? After the procedure, it is common to have:  Tiredness.  Forgetfulness about what happened after the procedure.  Impaired judgment for important decisions.  Nausea or vomiting.  Some difficulty with balance. Follow these instructions at home: For the time period you were told by your health care provider:  Rest as needed.  Do not participate in activities where you could fall or become injured.  Do not drive or use machinery.  Do not drink alcohol.  Do not take sleeping pills or medicines that cause drowsiness.  Do not make important decisions or sign legal documents.  Do not take care of children on your own.      Eating and drinking  Follow the diet that is recommended by your health care provider.  Drink enough fluid to keep your urine pale yellow.  If you vomit: ? Drink water, juice, or soup when you can drink without vomiting. ? Make sure you have little or no nausea before eating solid foods. General instructions  Have a responsible adult stay with you for the time you are told. It is important to have someone help care for you until you are awake and alert.  Take over-the-counter and prescription medicines only as told by your health care provider.  If you have sleep apnea, surgery and certain medicines can increase your risk for breathing problems. Follow instructions from your health care provider about wearing your sleep device: ? Anytime you are sleeping, including during daytime naps. ? While taking prescription pain medicines,  sleeping medicines, or medicines that make you drowsy.  Avoid smoking.  Keep all follow-up visits as told by your health care provider. This is important. Contact a health care provider if:  You keep feeling nauseous or you keep vomiting.  You feel light-headed.  You are still sleepy or having trouble with balance after 24 hours.  You develop a rash.  You have a fever.  You have redness or swelling around the IV site. Get help right away if:  You have trouble breathing.  You have new-onset confusion at home. Summary  For several hours after your procedure, you may feel tired. You may also be forgetful and have poor judgment.  Have a responsible adult stay with you for the time you are told. It is  important to have someone help care for you until you are awake and alert.  Rest as told. Do not drive or operate machinery. Do not drink alcohol or take sleeping pills.  Get help right away if you have trouble breathing, or if you suddenly become confused. This information is not intended to replace advice given to you by your health care provider. Make sure you discuss any questions you have with your health care provider. Document Revised: 08/24/2020 Document Reviewed: 11/11/2019 Elsevier Patient Education  2021 [employer].

## 2021-01-30 NOTE — Telephone Encounter (Signed)
Spoke with pt. Pt used some Miralax to help his bowels move and it worked for him. Pt is taking Linzess and states he will take Miralax as needed.

## 2021-01-30 NOTE — Telephone Encounter (Signed)
Please call patient at [phone]. He has questions about taking Miralax to make his bowels move.

## 2021-01-30 NOTE — Telephone Encounter (Signed)
Pre-op/covid test 02/01/21 at 1:00pm.   Tried to call pt to give him pre-op/covid test and procedure instructions, LMOVM for return call.

## 2021-01-30 NOTE — Telephone Encounter (Signed)
Tried to call pt, no answer, LMOVM for return call.

## 2021-01-30 NOTE — Telephone Encounter (Signed)
Letter mailed

## 2021-01-31 NOTE — Telephone Encounter (Signed)
Spoke to pt, informed him of pre-op/covid test tomorrow. He will come by office to pick up appt letter and procedure instructions-placed at front desk.

## 2021-02-01 ENCOUNTER — Encounter (HOSPITAL_COMMUNITY): Payer: Self-pay

## 2021-02-01 ENCOUNTER — Encounter (HOSPITAL_COMMUNITY)
Admission: RE | Admit: 2021-02-01 | Discharge: 2021-02-01 | Disposition: A | Discharge status: Home / Self Care | Payer: Medicare Other | Source: Ambulatory Visit | Attending: Internal Medicine | Admitting: Internal Medicine

## 2021-02-01 ENCOUNTER — Other Ambulatory Visit (HOSPITAL_COMMUNITY)
Admission: RE | Admit: 2021-02-01 | Discharge: 2021-02-01 | Disposition: A | Discharge status: Home / Self Care | Payer: Medicare Other | Source: Ambulatory Visit | Attending: Internal Medicine | Admitting: Internal Medicine

## 2021-02-01 ENCOUNTER — Other Ambulatory Visit: Payer: Self-pay

## 2021-02-01 ENCOUNTER — Other Ambulatory Visit: Payer: Self-pay | Admitting: Gastroenterology

## 2021-02-01 DIAGNOSIS — M1991 Primary osteoarthritis, unspecified site: Secondary | ICD-10-CM | POA: Diagnosis not present

## 2021-02-01 DIAGNOSIS — H6122 Impacted cerumen, left ear: Secondary | ICD-10-CM | POA: Diagnosis not present

## 2021-02-01 DIAGNOSIS — Z20822 Contact with and (suspected) exposure to covid-19: Secondary | ICD-10-CM | POA: Insufficient documentation

## 2021-02-01 DIAGNOSIS — Z01812 Encounter for preprocedural laboratory examination: Secondary | ICD-10-CM | POA: Insufficient documentation

## 2021-02-01 DIAGNOSIS — E1140 Type 2 diabetes mellitus with diabetic neuropathy, unspecified: Secondary | ICD-10-CM | POA: Diagnosis not present

## 2021-02-01 LAB — CBC WITH DIFFERENTIAL/PLATELET
Abs Immature Granulocytes: 0.03 10*3/uL (ref 0.00–0.07)
Basophils Absolute: 0 10*3/uL (ref 0.0–0.1)
Basophils Relative: 0 %
Eosinophils Absolute: 0.1 10*3/uL (ref 0.0–0.5)
Eosinophils Relative: 1 %
HCT: 35.9 % — ABNORMAL LOW (ref 39.0–52.0)
Hemoglobin: 11.5 g/dL — ABNORMAL LOW (ref 13.0–17.0)
Immature Granulocytes: 0 %
Lymphocytes Relative: 6 %
Lymphs Abs: 0.5 10*3/uL — ABNORMAL LOW (ref 0.7–4.0)
MCH: 33 pg (ref 26.0–34.0)
MCHC: 32 g/dL (ref 30.0–36.0)
MCV: 102.9 fL — ABNORMAL HIGH (ref 80.0–100.0)
Monocytes Absolute: 0.2 10*3/uL (ref 0.1–1.0)
Monocytes Relative: 2 %
Neutro Abs: 7.6 10*3/uL (ref 1.7–7.7)
Neutrophils Relative %: 91 %
Platelets: 207 10*3/uL (ref 150–400)
RBC: 3.49 MIL/uL — ABNORMAL LOW (ref 4.22–5.81)
RDW: 14.4 % (ref 11.5–15.5)
WBC: 8.5 10*3/uL (ref 4.0–10.5)
nRBC: 0 % (ref 0.0–0.2)

## 2021-02-01 LAB — BASIC METABOLIC PANEL
Anion gap: 9 (ref 5–15)
BUN: 27 mg/dL — ABNORMAL HIGH (ref 8–23)
CO2: 26 mmol/L (ref 22–32)
Calcium: 9.1 mg/dL (ref 8.9–10.3)
Chloride: 102 mmol/L (ref 98–111)
Creatinine, Ser: 2.46 mg/dL — ABNORMAL HIGH (ref 0.61–1.24)
GFR, Estimated: 27 mL/min — ABNORMAL LOW (ref >60)
Glucose, Bld: 162 mg/dL — ABNORMAL HIGH (ref 70–99)
Potassium: 3.9 mmol/L (ref 3.5–5.1)
Sodium: 137 mmol/L (ref 135–145)

## 2021-02-02 LAB — SARS CORONAVIRUS 2 (TAT 6-24 HRS): SARS Coronavirus 2: NEGATIVE

## 2021-02-05 ENCOUNTER — Encounter (HOSPITAL_COMMUNITY): Payer: Self-pay | Admitting: Internal Medicine

## 2021-02-05 ENCOUNTER — Other Ambulatory Visit: Payer: Self-pay

## 2021-02-05 ENCOUNTER — Encounter (HOSPITAL_COMMUNITY)
Admission: RE | Disposition: A | Discharge status: Home / Self Care | Payer: Self-pay | Source: Home / Self Care | Attending: Internal Medicine

## 2021-02-05 ENCOUNTER — Ambulatory Visit (HOSPITAL_COMMUNITY): Payer: Medicare Other | Admitting: Anesthesiology

## 2021-02-05 ENCOUNTER — Ambulatory Visit (HOSPITAL_COMMUNITY)
Admission: RE | Admit: 2021-02-05 | Discharge: 2021-02-05 | Disposition: A | Discharge status: Home / Self Care | Payer: Medicare Other | Attending: Internal Medicine | Admitting: Internal Medicine

## 2021-02-05 DIAGNOSIS — Z79899 Other long term (current) drug therapy: Secondary | ICD-10-CM | POA: Diagnosis not present

## 2021-02-05 DIAGNOSIS — E1122 Type 2 diabetes mellitus with diabetic chronic kidney disease: Secondary | ICD-10-CM | POA: Diagnosis not present

## 2021-02-05 DIAGNOSIS — I5032 Chronic diastolic (congestive) heart failure: Secondary | ICD-10-CM | POA: Diagnosis not present

## 2021-02-05 DIAGNOSIS — Z888 Allergy status to other drugs, medicaments and biological substances status: Secondary | ICD-10-CM | POA: Insufficient documentation

## 2021-02-05 DIAGNOSIS — Z86010 Personal history of colonic polyps: Secondary | ICD-10-CM | POA: Insufficient documentation

## 2021-02-05 DIAGNOSIS — Z7982 Long term (current) use of aspirin: Secondary | ICD-10-CM | POA: Diagnosis not present

## 2021-02-05 DIAGNOSIS — Z87891 Personal history of nicotine dependence: Secondary | ICD-10-CM | POA: Insufficient documentation

## 2021-02-05 DIAGNOSIS — Z794 Long term (current) use of insulin: Secondary | ICD-10-CM | POA: Diagnosis not present

## 2021-02-05 DIAGNOSIS — Z955 Presence of coronary angioplasty implant and graft: Secondary | ICD-10-CM | POA: Insufficient documentation

## 2021-02-05 DIAGNOSIS — N184 Chronic kidney disease, stage 4 (severe): Secondary | ICD-10-CM | POA: Diagnosis not present

## 2021-02-05 DIAGNOSIS — K3189 Other diseases of stomach and duodenum: Secondary | ICD-10-CM | POA: Diagnosis not present

## 2021-02-05 DIAGNOSIS — Z833 Family history of diabetes mellitus: Secondary | ICD-10-CM | POA: Insufficient documentation

## 2021-02-05 DIAGNOSIS — Z808 Family history of malignant neoplasm of other organs or systems: Secondary | ICD-10-CM | POA: Insufficient documentation

## 2021-02-05 DIAGNOSIS — I130 Hypertensive heart and chronic kidney disease with heart failure and stage 1 through stage 4 chronic kidney disease, or unspecified chronic kidney disease: Secondary | ICD-10-CM | POA: Insufficient documentation

## 2021-02-05 DIAGNOSIS — Z8249 Family history of ischemic heart disease and other diseases of the circulatory system: Secondary | ICD-10-CM | POA: Insufficient documentation

## 2021-02-05 DIAGNOSIS — I509 Heart failure, unspecified: Secondary | ICD-10-CM | POA: Diagnosis not present

## 2021-02-05 DIAGNOSIS — I252 Old myocardial infarction: Secondary | ICD-10-CM | POA: Insufficient documentation

## 2021-02-05 DIAGNOSIS — R1013 Epigastric pain: Secondary | ICD-10-CM | POA: Insufficient documentation

## 2021-02-05 DIAGNOSIS — R634 Abnormal weight loss: Secondary | ICD-10-CM | POA: Diagnosis present

## 2021-02-05 HISTORY — PX: ESOPHAGOGASTRODUODENOSCOPY (EGD) WITH PROPOFOL: SHX5813

## 2021-02-05 HISTORY — PX: BIOPSY: SHX5522

## 2021-02-05 LAB — GLUCOSE, CAPILLARY: Glucose-Capillary: 171 mg/dL — ABNORMAL HIGH (ref 70–99)

## 2021-02-05 IMAGING — CR LEFT SHOULDER - 1 VIEW
3 series · 3 of 3 positions shown · non-contrast
Comparison: None.

CLINICAL DATA: Left shoulder pain.

EXAM:
LEFT SHOULDER - 1 VIEW

[grashey]
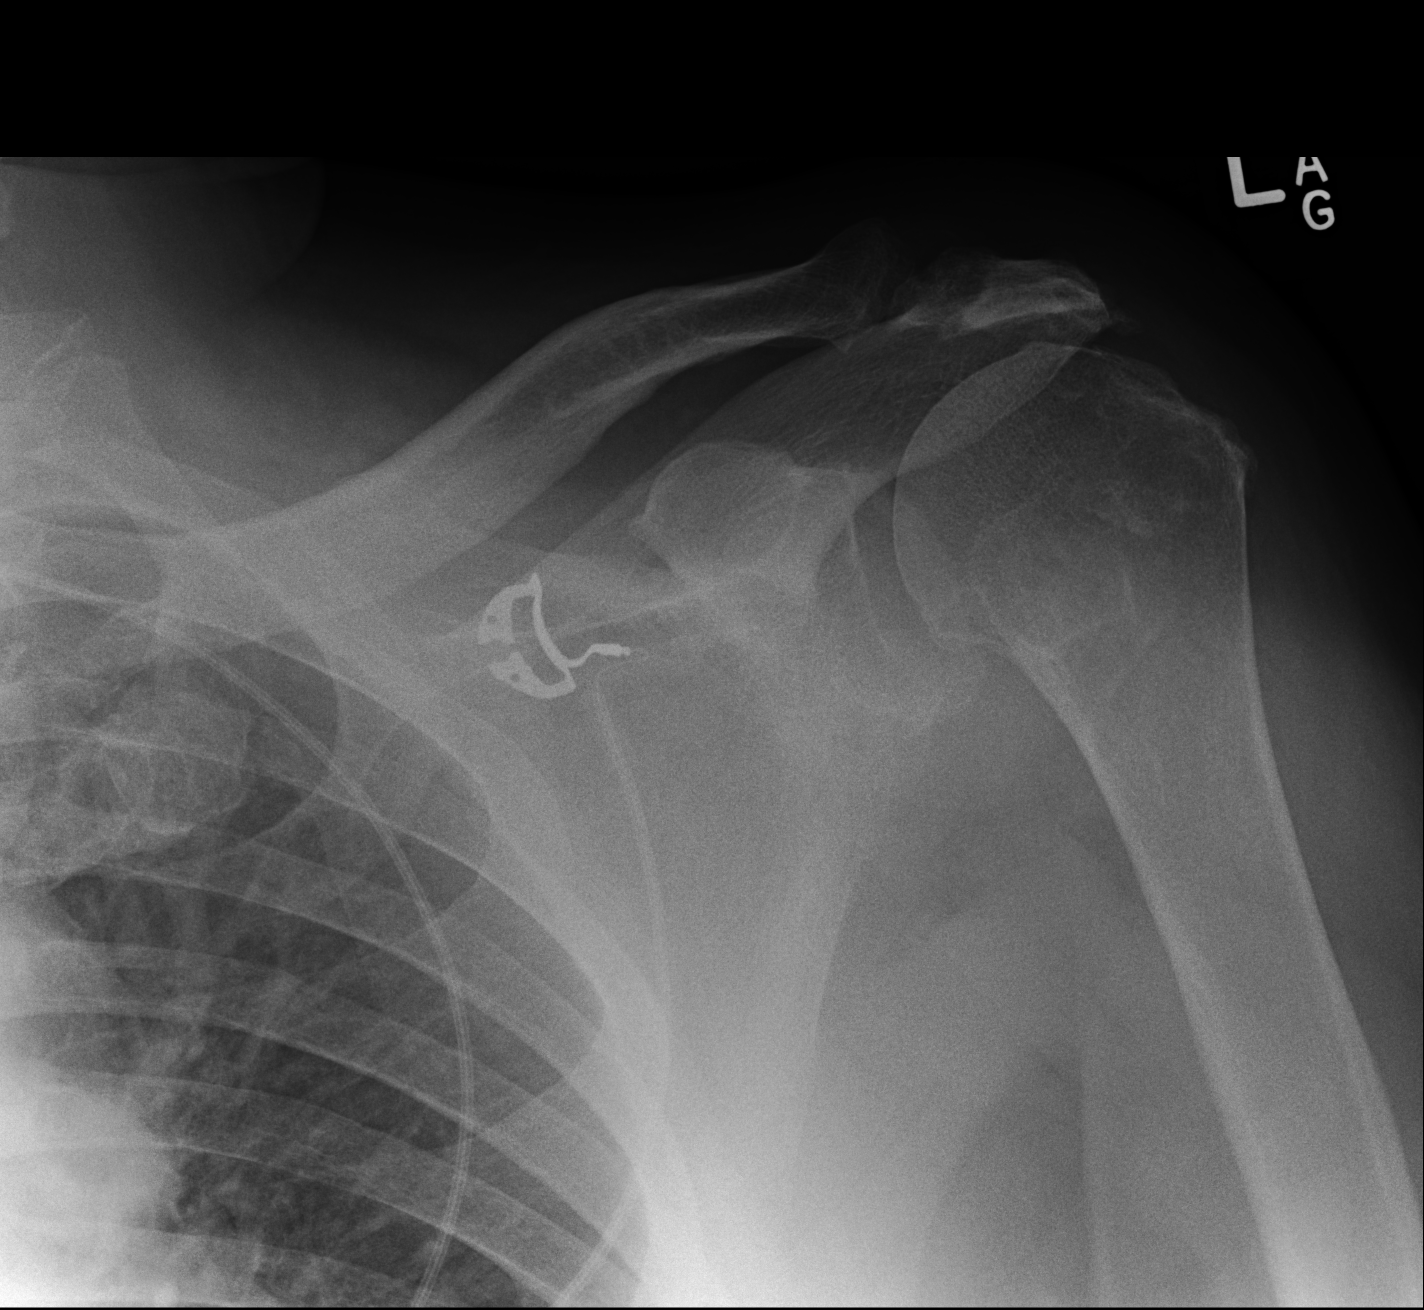

[scapula y]
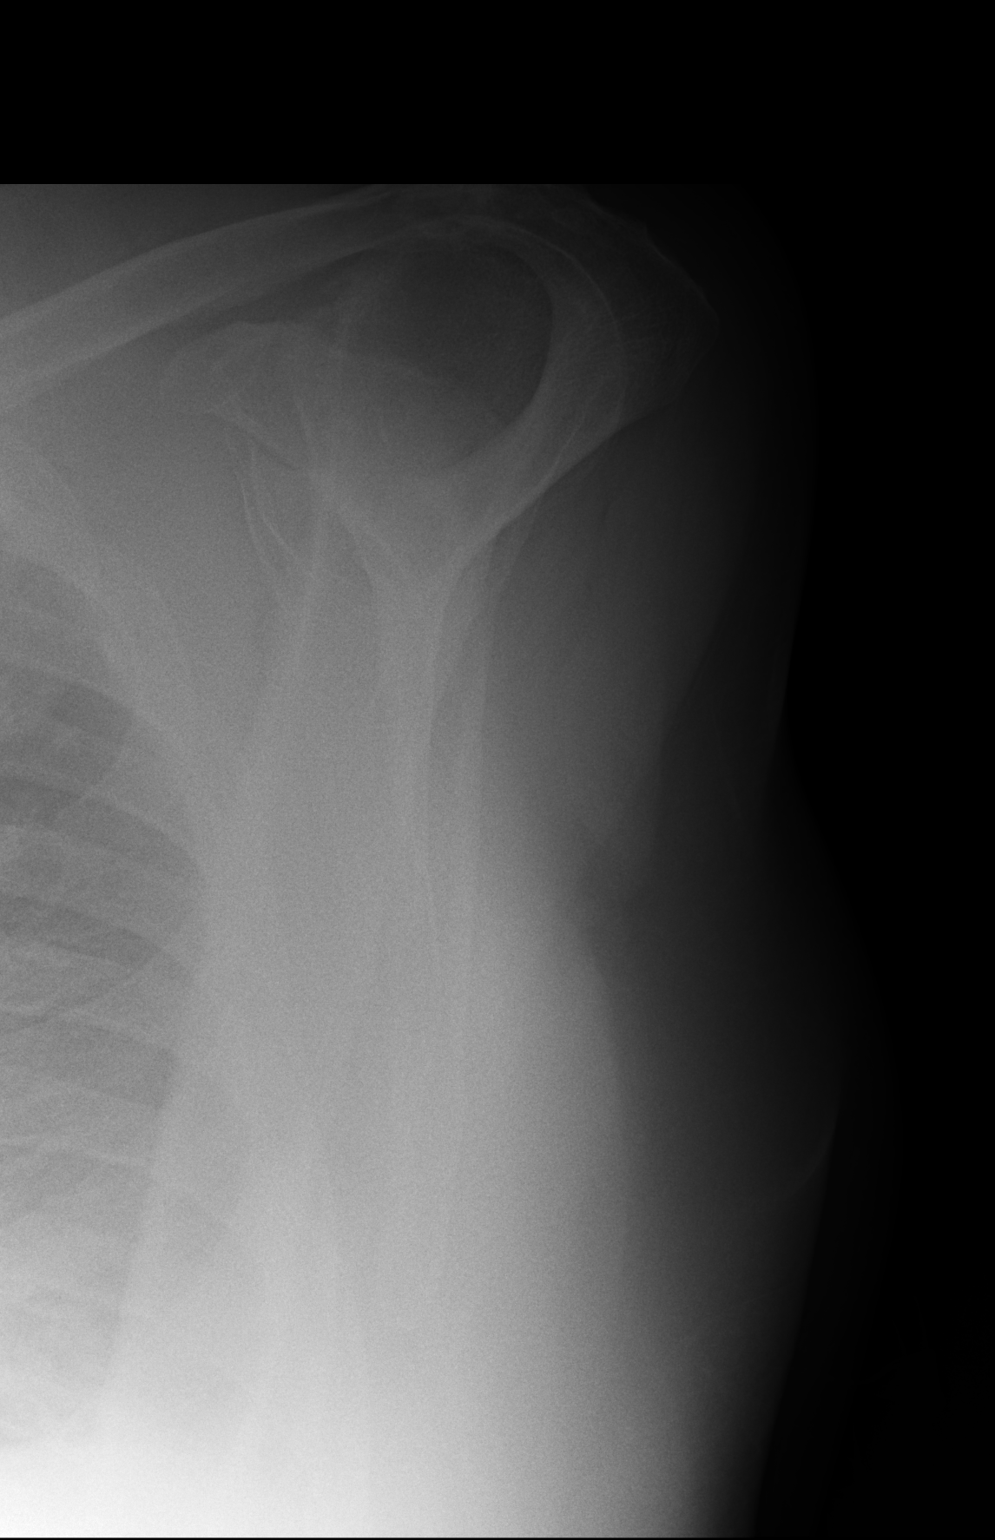

[ap]
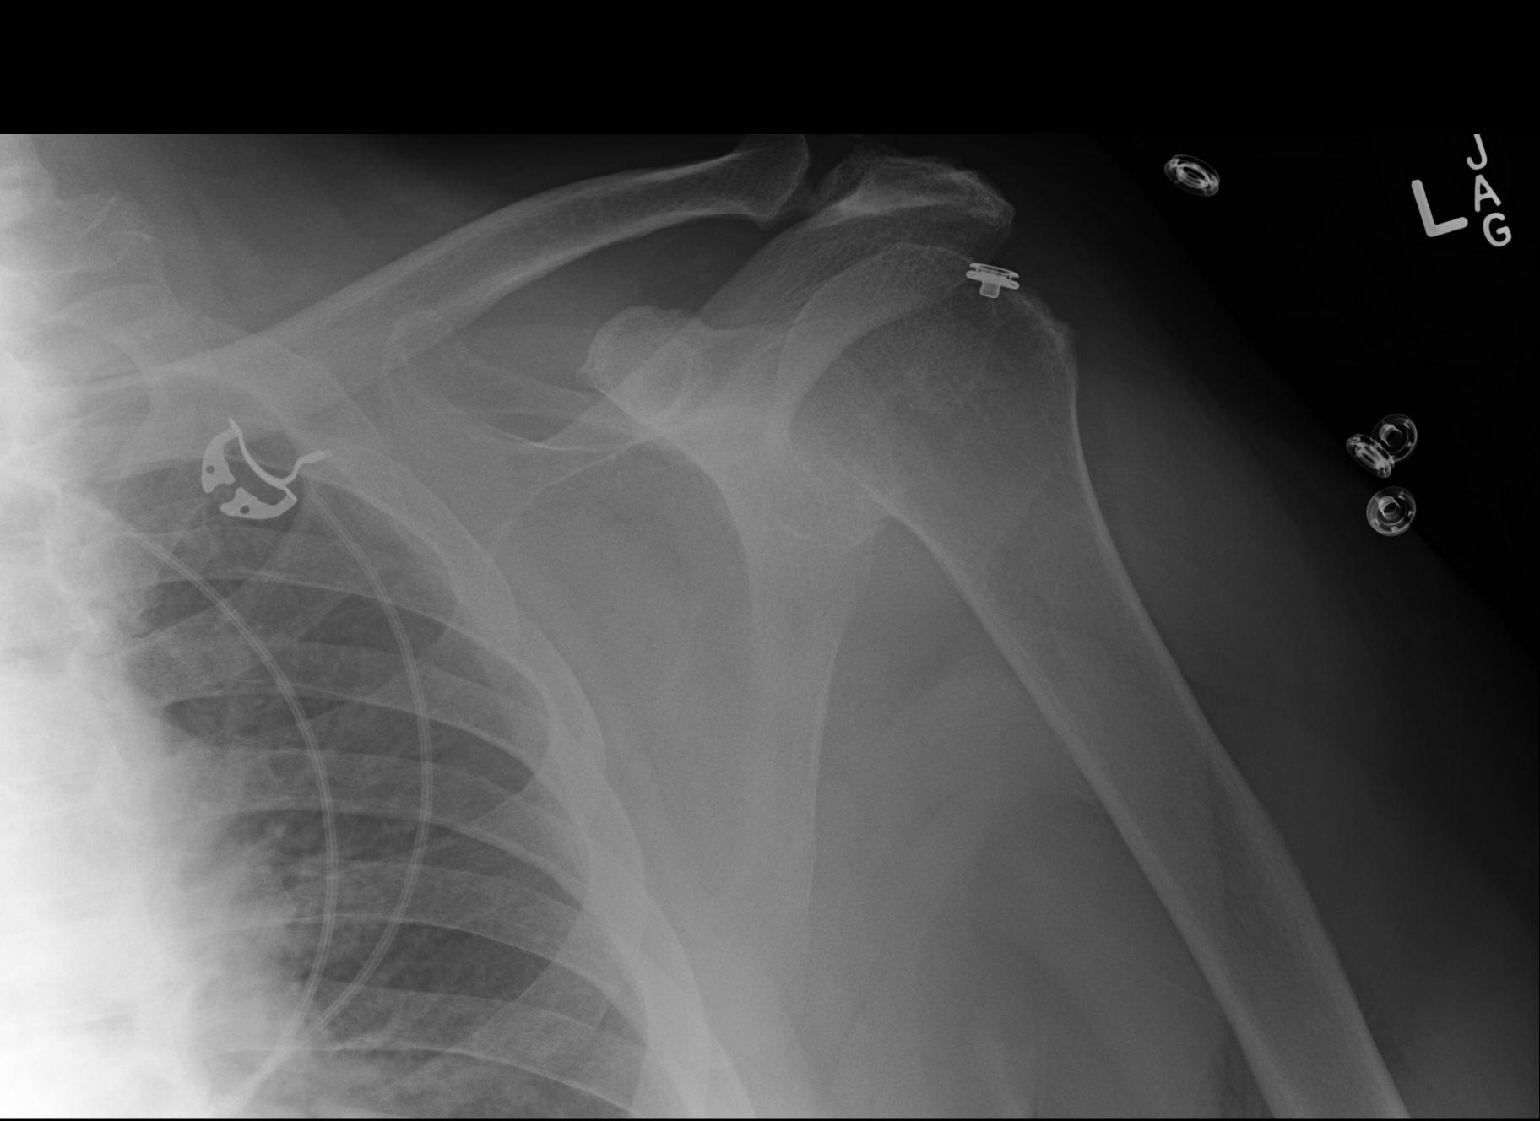

[3 of 3 positions shown; findings below may reference images not displayed]

FINDINGS: No acute fracture or dislocation is identified. There is mild
acromioclavicular spurring. The acromiohumeral interval is
borderline narrowed. The soft tissues are unremarkable.
IMPRESSION: Mild acromioclavicular osteoarthrosis without acute osseous
abnormality identified.

## 2021-02-05 SURGERY — ESOPHAGOGASTRODUODENOSCOPY (EGD) WITH PROPOFOL
Anesthesia: General

## 2021-02-05 MED ORDER — GLYCOPYRROLATE 0.2 MG/ML IJ SOLN
INTRAMUSCULAR | Status: AC
  Filled 2021-02-05: qty 1

## 2021-02-05 MED ORDER — LIDOCAINE VISCOUS HCL 2 % MT SOLN
OROMUCOSAL | Status: AC
  Filled 2021-02-05: qty 15

## 2021-02-05 MED ORDER — STERILE WATER FOR IRRIGATION IR SOLN
Status: DC | PRN
  Administered 2021-02-05: 1.5 mL

## 2021-02-05 MED ORDER — LIDOCAINE VISCOUS HCL 2 % MT SOLN
15.0000 mL | Freq: Once | OROMUCOSAL | Status: AC
  Administered 2021-02-05: 15 mL via OROMUCOSAL

## 2021-02-05 MED ORDER — GLYCOPYRROLATE 0.2 MG/ML IJ SOLN
0.2000 mg | Freq: Once | INTRAMUSCULAR | Status: AC
  Administered 2021-02-05: 0.2 mg via INTRAVENOUS

## 2021-02-05 MED ORDER — PROPOFOL 500 MG/50ML IV EMUL
INTRAVENOUS | Status: DC | PRN
  Administered 2021-02-05: 150 ug/kg/min via INTRAVENOUS

## 2021-02-05 MED ORDER — LACTATED RINGERS IV SOLN: INTRAVENOUS | Status: DC

## 2021-02-05 NOTE — Op Note (Signed)
[facility] Patient Name: [name] Procedure Date: 02/05/2021 7:58 AM MRN: [id number] Date of Birth: 08/19/1946 Attending MD: Norvel Richards , MD CSN: [id number] Age: 75 Admit Type: Outpatient Procedure:                Upper GI endoscopy Indications:              Epigastric abdominal pain, Weight loss Providers:                Norvel Richards, MD, Caprice Kluver, Nelma Rothman,                            Technician Referring MD:              Medicines:                Propofol per Anesthesia Complications:            No immediate complications. Estimated Blood Loss:     Estimated blood loss was minimal. Procedure:                Pre-Anesthesia Assessment:                           - Prior to the procedure, a History and Physical                            was performed, and patient medications and                            allergies were reviewed. The patient's tolerance of                            previous anesthesia was also reviewed. The risks                            and benefits of the procedure and the sedation                            options and risks were discussed with the patient.                            All questions were answered, and informed consent                            was obtained. Prior Anticoagulants: The patient has                            taken no previous anticoagulant or antiplatelet                            agents. ASA Grade Assessment: III - A patient with                            severe systemic disease. After reviewing the risks  and benefits, the patient was deemed in                            satisfactory condition to undergo the procedure.                           After obtaining informed consent, the endoscope was                            passed under direct vision. Throughout the                            procedure, the patient's blood pressure, pulse, and                             oxygen saturations were monitored continuously. The                            [id number]) was introduced through the mouth,                            and advanced to the second part of duodenum. The                            upper GI endoscopy was accomplished without                            difficulty. The patient tolerated the procedure                            well. Scope In: 8:26:38 AM Scope Out: 8:34:08 AM Total Procedure Duration: 0 hours 7 minutes 30 seconds  Findings:      The examined esophagus was normal.      Erythematous mucosa was found in the entire examined stomach.       Moderate?"patchy in distribution. No ulcer or infiltrating process seen.       This was biopsied with a cold forceps for histology. Estimated blood       loss was minimal.      The duodenal bulb and second portion of the duodenum were normal. Impression:               - Normal esophagus.                           - Erythematous mucosa in the stomach. Biopsied.                           - Normal duodenal bulb and second portion of the                            duodenum. Today's findings not very impressive.                            Patient states since Protonix increased to twice  daily is feeling some better. Recent CTA abdomen                            did not demonstrate any pathology Splane abdominal                            pain. Specifically, no significant mesenteric                            arterial stenosis Moderate Sedation:      Moderate (conscious) sedation was personally administered by an       anesthesia professional. The following parameters were monitored: oxygen       saturation, heart rate, blood pressure, respiratory rate, EKG, adequacy       of pulmonary ventilation, and response to care. Recommendation:           - Patient has a contact number available for                            emergencies. The signs and symptoms of potential                             delayed complications were discussed with the                            patient. Return to normal activities tomorrow.                            Written discharge instructions were provided to the                            patient.                           - Advance diet as tolerated.                           - Continue present medications. Continue Protonix                            40 mg twice daily                           - Await pathology results. Office visit with [place] in                            1 month Procedure Code(s):        --- Professional ---                           [phone], Esophagogastroduodenoscopy, flexible,                            transoral; with biopsy, single or multiple Diagnosis Code(s):        --- Professional ---  K31.89, Other diseases of stomach and duodenum                           R10.13, Epigastric pain                           R63.4, Abnormal weight loss CPT copyright 2019 American Medical Association. All rights reserved. The codes documented in this report are preliminary and upon coder review may  be revised to meet current compliance requirements. [name]. [name] Asman, MD Norvel Richards, MD 02/05/2021 8:49:48 AM This report has been signed electronically. Number of Addenda: 0

## 2021-02-05 NOTE — H&P (Addendum)
@LOGO @   Primary Care Physician:  Redmond School, MD Primary Gastroenterologist:  Dr.   Pre-Procedure History & Physical: HPI:  [name] is a 75 y.o. male here for further evaluation of vague upper abdominal pain.  Patient states pain is actually improved since Protonix was increased to twice daily.  CTA abdomen negative for significant mesenteric arterial stenosis.  Patient denies dysphagia.  Past Medical History:  Diagnosis Date  . Anxiety   . Benign prostatic hypertrophy    Nocturia  . CAD (coronary artery disease)    a.  NSTEMI 10/2012 s/p DES to LAD & DES to 1st diagonal with residual diffuse nonobstructive dz in LCx/RCA   . Chronic kidney disease, stage 3, mod decreased GFR (HCC)    Creatinine of 1.5 in 03/2009, STAGE 4 now  . Degenerative joint disease   . Depression    hx of  . Diabetes mellitus, type II ([place]) 12/10/2012.  . Diastolic heart failure    Pedal edema, "a long time ago"  . Erectile dysfunction   . Flu 2013   hx of  . GERD (gastroesophageal reflux disease)   . Gout   . Hemorrhoids   . High cholesterol   . Hypertension    Exercise induced  . Myocardial infarction ([place])   . NSVT (nonsustained ventricular tachycardia) (HCC)    Exercise induced  . Obesity   . 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 Tubular adenoma 2014    Past Surgical History:  Procedure Laterality Date  . BACK SURGERY  2007   x2  . CARDIAC CATHETERIZATION     with stent placement  . COLONOSCOPY WITH ESOPHAGOGASTRODUODENOSCOPY (EGD) N/A 11/23/2013   Dr. Oneida Alar- TCS= left sided colitis and mild proctitis likely due to ischemic colitis, moderate internal hemorrhoids, small nodule in the rectum, bx=tubular adenoma, EGD=-mild non-erosive gastritis  . COLONOSCOPY WITH PROPOFOL N/A 07/24/2020   hemorrhoids,  rectosigmoid lipoma, small adenoma removed.  Marland Kitchen HERNIA REPAIR    . LEFT HEART CATHETERIZATION WITH CORONARY ANGIOGRAM N/A 11/16/2012   Procedure: LEFT HEART  CATHETERIZATION WITH CORONARY ANGIOGRAM;  Surgeon: Sherren Mocha, MD;  Location: [facility] CATH LAB;  Service: Cardiovascular;  Laterality: N/A;  . PERCUTANEOUS CORONARY STENT INTERVENTION (PCI-S)  11/16/2012   Procedure: PERCUTANEOUS CORONARY STENT INTERVENTION (PCI-S);  Surgeon: Sherren Mocha, MD;  Location: [facility] CATH LAB;  Service: Cardiovascular;;  . POLYPECTOMY  07/24/2020   Procedure: POLYPECTOMY;  Surgeon: Daneil Dolin, MD;  Location: [facility] ENDO SUITE;  Service: Endoscopy;;  . TOTAL KNEE ARTHROPLASTY Right 11/15/2013   Procedure: RIGHT TOTAL KNEE ARTHROPLASTY;  Surgeon: Mauri Pole, MD;  Location: [facility] ORS;  Service: Orthopedics;  Laterality: Right;  . TOTAL KNEE ARTHROPLASTY Left 03/07/2015   Procedure: LEFT TOTAL KNEE ARTHROPLASTY;  Surgeon: Paralee Cancel, MD;  Location: [facility] ORS;  Service: Orthopedics;  Laterality: Left;    Prior to Admission medications   Medication Sig Start Date End Date Taking? Authorizing Provider  acarbose (PRECOSE) 50 MG tablet Take 50 mg by mouth in the morning, at noon, and at bedtime.  10/20/17  Yes [provider]  allopurinol (ZYLOPRIM) 100 MG tablet Take 100 mg by mouth daily. 08/17/20  Yes [provider]  amLODipine (NORVASC) 10 MG tablet Take 10 mg by mouth every evening.    Yes [provider]  aspirin EC 81 MG tablet Take 1 tablet (81 mg total) by mouth daily. 09/14/19  Yes Herminio Commons, MD  calcitRIOL (ROCALTROL)  0.25 MCG capsule Take 0.25 mcg by mouth every Sunday.  12/13/16  Yes [provider]  cholecalciferol (VITAMIN D) 1000 units tablet Take 1,000 Units daily by mouth.   Yes [provider]  cloNIDine (CATAPRES) 0.1 MG tablet Take 0.1 mg by mouth 3 (three) times daily. 02/15/20  Yes [provider]  dicyclomine (BENTYL) 10 MG capsule Take 10 mg by mouth 3 (three) times daily.   Yes [provider]  furosemide (LASIX) 40 MG tablet Take 1 tablet (40 mg total) by mouth 2 (two) times daily.  08/09/19  Yes Herminio Commons, MD  gabapentin (NEURONTIN) 300 MG capsule Take 300 mg by mouth 3 (three) times daily.  10/19/19  Yes [provider]  HYDROcodone-acetaminophen (NORCO) 10-325 MG tablet Take 1-2 tablets by mouth 4 (four) times daily as needed for moderate pain or severe pain. 10/28/19  Yes Shah, Pratik D, DO  insulin NPH Human (NOVOLIN N) 100 UNIT/ML injection Inject 0.22 mLs (22 Units total) into the skin every morning. Ands syringes 1/day Patient taking differently: Inject 21-22 Units into the skin every morning. Ands syringes 1/day 10/20/20  Yes Renato Shin, MD  linaclotide [facility]) 290 MCG CAPS capsule Take 1 capsule (290 mcg total) by mouth daily before breakfast. 12/21/20  Yes Rourk, Cristopher Estimable, MD  losartan (COZAAR) 25 MG tablet Take 25 mg by mouth daily.   Yes [provider]  meclizine (ANTIVERT) 25 MG tablet Take 25 mg by mouth 3 (three) times daily as needed for dizziness.   Yes [provider]  ondansetron (ZOFRAN) 4 MG tablet TAKE ONE TABLET (4MG TOTAL) BY MOUTH EVERY 8 HOURS AS NEEDED FOR NAUSEA OR VOMITING 02/01/21  Yes Annitta Needs, NP  pantoprazole (PROTONIX) 40 MG tablet Take 1 tablet (40 mg total) by mouth 2 (two) times daily before a meal. 01/10/21  Yes Annitta Needs, NP  pravastatin (PRAVACHOL) 20 MG tablet Take 1 tablet (20 mg total) by mouth every evening. 09/14/19 07/12/21 Yes Herminio Commons, MD  Probiotic Product (PROBIOTIC DAILY PO) Take 1 capsule by mouth daily.   Yes [provider]  acetaminophen (TYLENOL) 650 MG CR tablet Take 1,300 mg by mouth every 8 (eight) hours as needed for pain.    [provider]  nitroGLYCERIN (NITROSTAT) 0.4 MG SL tablet PLACE ONE TABLET UNDER THE TONGUE EVERY 5 MINUTES FOR 3 DOSES AS NEEDED Patient taking differently: Place 0.4 mg under the tongue every 5 (five) minutes as needed for chest pain. 07/22/17   Herminio Commons, MD  TRUE METRIX BLOOD GLUCOSE TEST test strip USE TO  TEST BLOOD SUGAR UP TO TWICE DAILY OR AS DIRECTED 08/19/20   Renato Shin, MD    Allergies as of 01/05/2021 - Review Complete 01/02/2021  Allergen Reaction Noted  . Niacin and related Other (See Comments) 10/24/2012    Family History  Problem Relation Age of Onset  . Hypertension Other   . Cancer Other   . Heart disease Other   . Diabetes Other   . Hypertension Mother   . Cancer Father        brain  . CAD Brother   . Colon cancer Neg Hx     Social History   Socioeconomic History  . Marital status: Widowed    Spouse name: Not on file  . Number of children: Not on file  . Years of education: Not on file  . Highest education level: Not on file  Occupational History  .  Occupation: Disabled    Employer: RETIRED  Tobacco Use  . Smoking status: Former Smoker    Packs/day: 1.00    Years: 30.00    Pack years: 30.00    Types: Cigarettes    Start date: 01/22/1965    Quit date: 12/23/1992    Years since quitting: 28.1  . Smokeless tobacco: Never Used  Vaping Use  . Vaping Use: Never used  Substance and Sexual Activity  . Alcohol use: No    Alcohol/week: 0.0 standard drinks  . Drug use: No  . Sexual activity: Not Currently  Other Topics Concern  . Not on file  Social History Narrative   Married   Right handed   One story home no caffeine   Social Determinants of Health   Financial Resource Strain: Not on file  Food Insecurity: Not on file  Transportation Needs: Not on file  Physical Activity: Not on file  Stress: Not on file  Social Connections: Not on file  Intimate Partner Violence: Not on file    Review of Systems: See HPI, otherwise negative ROS  Physical Exam: BP (!) 120/54   Pulse (!) 44   Temp 98.1 F (36.7 C) (Oral)   Resp 14   Ht 6' 2"  (1.88 m)   Wt 119.3 kg   SpO2 97%   BMI 33.77 kg/m  General:   Alert,  Well-developed, well-nourished, pleasant and cooperative in NAD  Mouth:  No deformity or lesions. Neck:  Supple; no masses or  thyromegaly. No significant cervical adenopathy. Lungs:  Clear throughout to auscultation.   No wheezes, crackles, or rhonchi. No acute distress. Heart:  Regular rate and rhythm; no murmurs, clicks, rubs,  or gallops. Abdomen: Non-distended, normal bowel sounds.  Soft and nontender without appreciable mass or hepatosplenomegaly.  Pulses:  Normal pulses noted. Extremities:  Without clubbing or edema.  Impression/Plan: 75 year old gent with vague upper abdominal pain and weight loss recently.  Symptoms have been improved with increasing omeprazole to twice daily.  No dysphagia.  CTA abdomen reassuring for no obvious occult malignancy or significant mesenteric arterial stenosis.  Per plan, diagnostic EGD today.  The risks, benefits, limitations, alternatives and imponderables have been reviewed with the patient. Potential for esophageal dilation, biopsy, etc. have also been reviewed.  Questions have been answered. All parties agreeable.     Notice: This dictation was prepared with Dragon dictation along with smaller phrase technology. Any transcriptional errors that result from this process are unintentional and may not be corrected upon review.

## 2021-02-05 NOTE — Anesthesia Postprocedure Evaluation (Signed)
Anesthesia Post Note  Patient: [name]  Procedure(s) Performed: ESOPHAGOGASTRODUODENOSCOPY (EGD) WITH PROPOFOL (N/A ) BIOPSY  Patient location during evaluation: Phase II Anesthesia Type: General Level of consciousness: awake, oriented, awake and alert and patient cooperative Pain management: satisfactory to patient Vital Signs Assessment: post-procedure vital signs reviewed and stable Respiratory status: spontaneous breathing, respiratory function stable and nonlabored ventilation Cardiovascular status: bradycardic Postop Assessment: no apparent nausea or vomiting Anesthetic complications: no   No complications documented.   Last Vitals:  Vitals:   02/05/21 0719  BP: (!) 120/54  Pulse: (!) 44  Resp: 14  Temp: 36.7 C  SpO2: 97%    Last Pain:  Vitals:   02/05/21 0819  TempSrc:   PainSc: 5                  Shaindy Reader

## 2021-02-05 NOTE — Anesthesia Preprocedure Evaluation (Addendum)
Anesthesia Evaluation  Patient identified by MRN, date of birth, ID band Patient awake    Reviewed: Allergy & Precautions, NPO status , Patient's Chart, lab work & pertinent test results  History of Anesthesia Complications Negative for: history of anesthetic complications  Airway Mallampati: III  TM Distance: >3 FB Neck ROM: Full    Dental  (+) Missing, Dental Advisory Given, Partial Upper, Partial Lower   Pulmonary sleep apnea , former smoker,    Pulmonary exam normal breath sounds clear to auscultation       Cardiovascular Exercise Tolerance: Good hypertension (Bradycardia rate 45, as per cardiologist note he has bradycardia.), Pt. on medications + CAD, + Past MI, + Cardiac Stents and +CHF  + dysrhythmias (V Tach)  Rhythm:Irregular Rate:Bradycardia  1. Left ventricular ejection fraction, by estimation, is 55 to 60%. The  left ventricle has normal function. The left ventricle has no regional  wall motion abnormalities. There is mild left ventricular hypertrophy.  Left ventricular diastolic parameters  are consistent with Grade I diastolic dysfunction (impaired relaxation).  2. Right ventricular systolic function is normal. The right ventricular  size is normal.  3. Left atrial size was mildly dilated.  4. The mitral valve is normal in structure. No evidence of mitral valve  regurgitation. No evidence of mitral stenosis.  5. The aortic valve is tricuspid. Aortic valve regurgitation is not  visualized. No aortic stenosis is present.   Bradycardia rate 45, as per cardiologist note he has bradycardia.   Neuro/Psych PSYCHIATRIC DISORDERS Anxiety Depression    GI/Hepatic GERD  Medicated,  Endo/Other  diabetes, Well Controlled, Type 2, Oral Hypoglycemic Agents  Renal/GU Renal InsufficiencyRenal disease     Musculoskeletal  (+) Arthritis , Osteoarthritis,    Abdominal   Peds  Hematology  (+) anemia ,    Anesthesia Other Findings 20-Jul-2020 14:05:33 [facility] ROUTINE RECORD Sinus rhythm with 1st degree A-V block Abnormal ECG Premature ventricular complexes RESOLVED SINCE PREVIOUS Confirmed by Kirk Ruths [phone]) on 07/20/2020 2:43:55 PM  Reproductive/Obstetrics                            Anesthesia Physical  Anesthesia Plan  ASA: III  Anesthesia Plan: General   Post-op Pain Management:    Induction: Intravenous  PONV Risk Score and Plan: TIVA  Airway Management Planned: Nasal Cannula, Natural Airway and Simple Face Mask  Additional Equipment:   Intra-op Plan:   Post-operative Plan:   Informed Consent: I have reviewed the patients History and Physical, chart, labs and discussed the procedure including the risks, benefits and alternatives for the proposed anesthesia with the patient or authorized representative who has indicated his/her understanding and acceptance.     Dental advisory given  Plan Discussed with: CRNA and Surgeon  Anesthesia Plan Comments: (Risk of renal function getting worse after anesthesia was explained)        Anesthesia Quick Evaluation

## 2021-02-05 NOTE — Transfer of Care (Signed)
Immediate Anesthesia Transfer of Care Note  Patient: [name]  Procedure(s) Performed: ESOPHAGOGASTRODUODENOSCOPY (EGD) WITH PROPOFOL (N/A ) BIOPSY  Patient Location: PACU  Anesthesia Type:General  Level of Consciousness: awake, alert , oriented and patient cooperative  Airway & Oxygen Therapy: Patient Spontanous Breathing  Post-op Assessment: Report given to RN, Post -op Vital signs reviewed and stable and Patient moving all extremities X 4  Post vital signs: Reviewed and stable  Last Vitals:  Vitals Value Taken Time  BP    Temp    Pulse    Resp    SpO2      Last Pain:  Vitals:   02/05/21 0819  TempSrc:   PainSc: 5       Patients Stated Pain Goal: 10 (91/79/15 0569)  Complications: No complications documented.

## 2021-02-05 NOTE — Discharge Instructions (Signed)
EGD Discharge instructions Please read the instructions outlined below and refer to this sheet in the next few weeks. These discharge instructions provide you with general information on caring for yourself after you leave the hospital. Your doctor may also give you specific instructions. While your treatment has been planned according to the most current medical practices available, unavoidable complications occasionally occur. If you have any problems or questions after discharge, please call your doctor. ACTIVITY  You may resume your regular activity but move at a slower pace for the next 24 hours.   Take frequent rest periods for the next 24 hours.   Walking will help expel (get rid of) the air and reduce the bloated feeling in your abdomen.   No driving for 24 hours (because of the anesthesia (medicine) used during the test).   You may shower.   Do not sign any important legal documents or operate any machinery for 24 hours (because of the anesthesia used during the test).  NUTRITION  Drink plenty of fluids.   You may resume your normal diet.   Begin with a light meal and progress to your normal diet.   Avoid alcoholic beverages for 24 hours or as instructed by your caregiver.  MEDICATIONS  You may resume your normal medications unless your caregiver tells you otherwise.  WHAT YOU CAN EXPECT TODAY  You may experience abdominal discomfort such as a feeling of fullness or "gas" pains.  FOLLOW-UP  Your doctor will discuss the results of your test with you.  SEEK IMMEDIATE MEDICAL ATTENTION IF ANY OF THE FOLLOWING OCCUR:  Excessive nausea (feeling sick to your stomach) and/or vomiting.   Severe abdominal pain and distention (swelling).   Trouble swallowing.   Temperature over 101 F (37.8 C).   Rectal bleeding or vomiting of blood.    Your stomach was inflamed.  Biopsies were taken.  Continue Protonix 40 mg twice daily  Office visit with Roseanne Kaufman in 4  weeks  Further recommendations to follow pending review of pathology report  At patient request, I called Oneita Hurt at [phone] ; unable to reach.

## 2021-02-06 ENCOUNTER — Telehealth: Payer: Self-pay | Admitting: Internal Medicine

## 2021-02-06 LAB — SURGICAL PATHOLOGY

## 2021-02-06 NOTE — Telephone Encounter (Signed)
Pt had procedure yesterday and was asking about his results and his stomach was still hurting him. I told him once Dr Gala Romney reviews his results the nurse will call him. [phone]

## 2021-02-06 NOTE — Telephone Encounter (Signed)
Noted  

## 2021-02-06 NOTE — Telephone Encounter (Signed)
Waiting for bx results

## 2021-02-06 NOTE — Telephone Encounter (Signed)
Pt was given lab results yesterday. Awaiting EGD results. FYI- pt is inquiring about EGD results. Pt is asking to find out results quickly per pt.

## 2021-02-07 ENCOUNTER — Encounter: Payer: Self-pay | Admitting: Internal Medicine

## 2021-02-08 ENCOUNTER — Encounter (HOSPITAL_COMMUNITY): Payer: Self-pay | Admitting: Internal Medicine

## 2021-02-15 ENCOUNTER — Other Ambulatory Visit: Payer: Self-pay

## 2021-02-15 ENCOUNTER — Encounter: Payer: Self-pay | Admitting: Gastroenterology

## 2021-02-15 ENCOUNTER — Ambulatory Visit: Payer: Medicare Other | Admitting: Gastroenterology

## 2021-02-15 VITALS — BP 135/70 | HR 63 | Temp 96.8°F | Ht 72.0 in | Wt 271.8 lb

## 2021-02-15 DIAGNOSIS — K5900 Constipation, unspecified: Secondary | ICD-10-CM | POA: Diagnosis not present

## 2021-02-15 DIAGNOSIS — K219 Gastro-esophageal reflux disease without esophagitis: Secondary | ICD-10-CM

## 2021-02-15 DIAGNOSIS — R1030 Lower abdominal pain, unspecified: Secondary | ICD-10-CM | POA: Diagnosis not present

## 2021-02-15 NOTE — Progress Notes (Signed)
Referring Provider: Redmond School, MD Primary Care Physician:  Redmond School, MD Primary GI: Dr. Gala Romney   Chief Complaint  Patient presents with  . Abdominal Pain    Some but better since started Linzess. Linzess is expensive. Some tenderness lower abd  . Gas    HPI:   [name] is a 74 y.o. male presenting today with a history of chronic GERD, constipation, abdominal pain for past few months with evaluation thus far including CT abd/pelvis without contrast in Nov 2021 without acute findings. Advanced atherosclerotic disease noted; therefore, CTA was performed to exclude chronic mesenteric ischemia. This was done in Jan 2022 with mesenteric arterial disease prsent but without support for chronic mesenteric ischemia as all 3 arteries patient and greatest degree of narrowing was estimated 50% of proximal SMA. He then underwent EGD that was overall unrevealing.   He has noted improvement with BID PPI. Linzess for constipation. linzess 50$ monthly. He is willing to pay this as it is working well. Lower abdominal pain much improved. Comes and goes. Has lots of gas. Eats cereal with almond milk, drinks protein shake and protein bar from [facility]. Tray for lunch from [employer]. Dinner baked meats, soft foods.     Past Medical History:  Diagnosis Date  . Anxiety   . Benign prostatic hypertrophy    Nocturia  . CAD (coronary artery disease)    a.  NSTEMI 10/2012 s/p DES to LAD & DES to 1st diagonal with residual diffuse nonobstructive dz in LCx/RCA   . Chronic kidney disease, stage 3, mod decreased GFR (HCC)    Creatinine of 1.5 in 03/2009, STAGE 4 now  . Degenerative joint disease   . Depression    hx of  . Diabetes mellitus, type II ([place]) 12/10/2012.  . Diastolic heart failure    Pedal edema, "a long time ago"  . Erectile dysfunction   . Flu 2013   hx of  . GERD (gastroesophageal reflux disease)   . Gout   . Hemorrhoids   . High cholesterol   . Hypertension     Exercise induced  . Myocardial infarction ([place])   . NSVT (nonsustained ventricular tachycardia) (HCC)    Exercise induced  . Obesity   . 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 Tubular adenoma 2014    Past Surgical History:  Procedure Laterality Date  . BACK SURGERY  2007   x2  . BIOPSY  02/05/2021   Procedure: BIOPSY;  Surgeon: Daneil Dolin, MD;  [facility];  Service: Endoscopy;;  . CARDIAC CATHETERIZATION     with stent placement  . COLONOSCOPY WITH ESOPHAGOGASTRODUODENOSCOPY (EGD) N/A 11/23/2013   Dr. Oneida Alar- TCS= left sided colitis and mild proctitis likely due to ischemic colitis, moderate internal hemorrhoids, small nodule in the rectum, bx=tubular adenoma, EGD=-mild non-erosive gastritis  . COLONOSCOPY WITH PROPOFOL N/A 07/24/2020   hemorrhoids,  rectosigmoid lipoma, small adenoma removed.  . ESOPHAGOGASTRODUODENOSCOPY (EGD) WITH PROPOFOL N/A 02/05/2021   Normal esophagus, erythematous mucosa in entire stomach, no obvious ulcer, s/p biopsy. Normal duodenum. Negative H.pylori.   Marland Kitchen HERNIA REPAIR    . LEFT HEART CATHETERIZATION WITH CORONARY ANGIOGRAM N/A 11/16/2012   Procedure: LEFT HEART CATHETERIZATION WITH CORONARY ANGIOGRAM;  Surgeon: Sherren Mocha, MD;  Location: [facility] CATH LAB;  Service: Cardiovascular;  Laterality: N/A;  . PERCUTANEOUS CORONARY STENT INTERVENTION (PCI-S)  11/16/2012   Procedure: PERCUTANEOUS CORONARY STENT INTERVENTION (PCI-S);  Surgeon:  Sherren Mocha, MD;  Location: [facility] CATH LAB;  Service: Cardiovascular;;  . POLYPECTOMY  07/24/2020   Procedure: POLYPECTOMY;  Surgeon: Daneil Dolin, MD;  Location: [facility];  Service: Endoscopy;;  . TOTAL KNEE ARTHROPLASTY Right 11/15/2013   Procedure: RIGHT TOTAL KNEE ARTHROPLASTY;  Surgeon: Mauri Pole, MD;  Location: [facility];  Service: Orthopedics;  Laterality: Right;  . TOTAL KNEE ARTHROPLASTY Left 03/07/2015   Procedure: LEFT TOTAL KNEE ARTHROPLASTY;  Surgeon: Paralee Cancel, MD;   Location: [facility];  Service: Orthopedics;  Laterality: Left;    Current Outpatient Medications  Medication Sig Dispense Refill  . acarbose (PRECOSE) 50 MG tablet Take 50 mg by mouth in the morning, at noon, and at bedtime.     Marland Kitchen acetaminophen (TYLENOL) 650 MG CR tablet Take 1,300 mg by mouth every 8 (eight) hours as needed for pain.    Marland Kitchen allopurinol (ZYLOPRIM) 100 MG tablet Take 100 mg by mouth daily.    Marland Kitchen amLODipine (NORVASC) 10 MG tablet Take 10 mg by mouth every evening.     Marland Kitchen aspirin EC 81 MG tablet Take 1 tablet (81 mg total) by mouth daily. 90 tablet 3  . calcitRIOL (ROCALTROL) 0.25 MCG capsule Take 0.25 mcg by mouth every Sunday.     . cholecalciferol (VITAMIN D) 1000 units tablet Take 1,000 Units daily by mouth.    . cloNIDine (CATAPRES) 0.1 MG tablet Take 0.1 mg by mouth 3 (three) times daily.    Marland Kitchen dicyclomine (BENTYL) 10 MG capsule Take 10 mg by mouth 3 (three) times daily.    . furosemide (LASIX) 40 MG tablet Take 1 tablet (40 mg total) by mouth 2 (two) times daily. 60 tablet 3  . gabapentin (NEURONTIN) 300 MG capsule Take 300 mg by mouth 3 (three) times daily.     Marland Kitchen HYDROcodone-acetaminophen (NORCO) 10-325 MG tablet Take 1-2 tablets by mouth 4 (four) times daily as needed for moderate pain or severe pain. 10 tablet 0  . insulin NPH Human (NOVOLIN N) 100 UNIT/ML injection Inject 0.22 mLs (22 Units total) into the skin every morning. Ands syringes 1/day (Patient taking differently: Inject 21-22 Units into the skin every morning. Ands syringes 1/day) 10 mL 11  . linaclotide (LINZESS) 290 MCG CAPS capsule Take 1 capsule (290 mcg total) by mouth daily before breakfast. 30 capsule 11  . losartan (COZAAR) 25 MG tablet Take 25 mg by mouth daily.    . meclizine (ANTIVERT) 25 MG tablet Take 25 mg by mouth 3 (three) times daily as needed for dizziness.    . nitroGLYCERIN (NITROSTAT) 0.4 MG SL tablet PLACE ONE TABLET UNDER THE TONGUE EVERY 5 MINUTES FOR 3 DOSES AS NEEDED (Patient taking  differently: Place 0.4 mg under the tongue every 5 (five) minutes as needed for chest pain.) 25 tablet 3  . ondansetron (ZOFRAN) 4 MG tablet TAKE ONE TABLET (4MG TOTAL) BY MOUTH EVERY 8 HOURS AS NEEDED FOR NAUSEA OR VOMITING 30 tablet 3  . pantoprazole (PROTONIX) 40 MG tablet Take 1 tablet (40 mg total) by mouth 2 (two) times daily before a meal. 60 tablet 3  . pravastatin (PRAVACHOL) 20 MG tablet Take 1 tablet (20 mg total) by mouth every evening. 90 tablet 3  . Probiotic Product (PROBIOTIC DAILY PO) Take 1 capsule by mouth daily.    . TRUE METRIX BLOOD GLUCOSE TEST test strip USE TO TEST BLOOD SUGAR UP TO TWICE DAILY OR AS DIRECTED 180 each 3   No current facility-administered medications for  this visit.    Allergies as of 02/15/2021 - Review Complete 02/15/2021  Allergen Reaction Noted  . Niacin and related Other (See Comments) 10/24/2012    Family History  Problem Relation Age of Onset  . Hypertension Other   . Cancer Other   . Heart disease Other   . Diabetes Other   . Hypertension Mother   . Cancer Father        brain  . CAD Brother   . Colon cancer Neg Hx     Social History   Socioeconomic History  . Marital status: Widowed    Spouse name: Not on file  . Number of children: Not on file  . Years of education: Not on file  . Highest education level: Not on file  Occupational History  . Occupation: Disabled    Employer: RETIRED  Tobacco Use  . Smoking status: Former Smoker    Packs/day: 1.00    Years: 30.00    Pack years: 30.00    Types: Cigarettes    Start date: 01/22/1965    Quit date: 12/23/1992    Years since quitting: 28.1  . Smokeless tobacco: Never Used  Vaping Use  . Vaping Use: Never used  Substance and Sexual Activity  . Alcohol use: No    Alcohol/week: 0.0 standard drinks  . Drug use: No  . Sexual activity: Not Currently  Other Topics Concern  . Not on file  Social History Narrative   Married   Right handed   One story home no caffeine    Social Determinants of Health   Financial Resource Strain: Not on file  Food Insecurity: Not on file  Transportation Needs: Not on file  Physical Activity: Not on file  Stress: Not on file  Social Connections: Not on file    Review of Systems: Gen: Denies fever, chills, anorexia. Denies fatigue, weakness, weight loss.  CV: Denies chest pain, palpitations, syncope, peripheral edema, and claudication. Resp: Denies dyspnea at rest, cough, wheezing, coughing up blood, and pleurisy. GI: see HPI Derm: Denies rash, itching, dry skin Psych: Denies depression, anxiety, memory loss, confusion. No homicidal or suicidal ideation.  Heme: Denies bruising, bleeding, and enlarged lymph nodes.  Physical Exam: BP 135/70   Pulse 63   Temp (!) 96.8 F (36 C) (Temporal)   Ht 6' (1.829 m)   Wt 271 lb 12.8 oz (123.3 kg)   BMI 36.86 kg/m  General:   Alert and oriented. No distress noted. Pleasant and cooperative.  Head:  Normocephalic and atraumatic. Eyes:  Conjuctiva clear without scleral icterus. Mouth:  Mask in place Abdomen:  +BS, soft, non-tender and non-distended. No rebound or guarding. No HSM or masses noted. Msk:  Symmetrical without gross deformities. Normal posture. Extremities:  With pedal and ankle edema Neurologic:  Alert and  oriented x4 Psych:  Alert and cooperative. Normal mood and affect.  ASSESSMENT: BISHOY CUPP is a 75 y.o. male presenting today with a history of chronic GERD, constipation, abdominal pain for past few months with evaluation thus far including CT abd/pelvis without contrast in Nov 2021 without acute findings. Advanced atherosclerotic disease noted; therefore, CTA was performed to exclude chronic mesenteric ischemia. This was done in Jan 2022 with mesenteric arterial disease prsent but without support for chronic mesenteric ischemia as all 3 arteries patient and greatest degree of narrowing was estimated 50% of proximal SMA. He then underwent EGD that was  overall unrevealing.   Returns today markedly improved with Protonix BID and addition  of Linzess 290 mcg daily. He does still have mild lower abdominal discomfort but reports feeling much better. Gas likely playing a role. We discussed dietary intake as well could be playing a role. No alarm signs/symptoms.   PLAN:  Continue PPI BID  Linzess 290 mcg daily May take Gas-x as needed Limit dairy, fried foods Return in 3 months   Annitta Needs, PhD, [facility]astro[facility]

## 2021-02-15 NOTE — Patient Instructions (Addendum)
Continue Protonix twice a day as you are doing!  Continue Linzess once daily.  You can take Gas-x as needed over-the-counter.   We will see you in 3 months!   I enjoyed seeing you again today! As you know, I value our relationship and want to provide genuine, compassionate, and quality care. I welcome your feedback. If you receive a survey regarding your visit,  I greatly appreciate you taking time to fill this out. See you next time!  Annitta Needs, PhD, ANP-BC [facility] Gastroenterology

## 2021-02-26 ENCOUNTER — Other Ambulatory Visit: Payer: Self-pay

## 2021-02-28 ENCOUNTER — Ambulatory Visit: Payer: Medicare Other | Admitting: Endocrinology

## 2021-02-28 ENCOUNTER — Other Ambulatory Visit: Payer: Self-pay

## 2021-02-28 VITALS — BP 132/62 | HR 60 | Ht 74.0 in | Wt 276.6 lb

## 2021-02-28 DIAGNOSIS — E1169 Type 2 diabetes mellitus with other specified complication: Secondary | ICD-10-CM

## 2021-02-28 DIAGNOSIS — E669 Obesity, unspecified: Secondary | ICD-10-CM | POA: Diagnosis not present

## 2021-02-28 DIAGNOSIS — E1140 Type 2 diabetes mellitus with diabetic neuropathy, unspecified: Secondary | ICD-10-CM | POA: Diagnosis not present

## 2021-02-28 LAB — POCT GLYCOSYLATED HEMOGLOBIN (HGB A1C): Hemoglobin A1C: 7.3 % — AB (ref 4.0–5.6)

## 2021-02-28 MED ORDER — INSULIN NPH (HUMAN) (ISOPHANE) 100 UNIT/ML ~~LOC~~ SUSP: 22.0000 [IU] | SUBCUTANEOUS | 11 refills | Status: DC

## 2021-02-28 NOTE — Patient Instructions (Addendum)
check your blood sugar twice a day.  vary the time of day when you check, between before the 3 meals, and at bedtime.  also check if you have symptoms of your blood sugar being too high or too low.  please keep a record of the readings and bring it to your next appointment here (or you can bring the meter itself).  You can write it on any piece of paper.  please call us sooner if your blood sugar goes below 70, or if you have a lot of readings over 200.   Please continue the same insulin: 22 units each morning.   Please come back for a follow-up appointment in 4 months.

## 2021-02-28 NOTE — Progress Notes (Signed)
Subjective:    Patient ID: [name], male    DOB: [birth year], 75 y.o.   MRN: [id number]  HPI Pt returns for f/u of diabetes mellitus:  DM type: Insulin-requiring type 2 Dx'ed: 2800 Complications: CAD, PAD, and stage 4 CRI.   Therapy: insulin since 2020.  DKA: never Severe hypoglycemia: never.  Pancreatitis: never Pancreatic imaging: normal on 2017 CT SDOH: he declines brand name meds, due to cost.   Other: He did not tolerate metformin (nausea); he declines multiple daily injections.  Interval history: no cbg record, but states fasting cbg's vary from 98-150.  pt states he feels well in general.  Pt says he takes the insulin as rx'ed.  Past Medical History:  Diagnosis Date  . Anxiety   . Benign prostatic hypertrophy    Nocturia  . CAD (coronary artery disease)    a.  NSTEMI 10/2012 s/p DES to LAD & DES to 1st diagonal with residual diffuse nonobstructive dz in LCx/RCA   . Chronic kidney disease, stage 3, mod decreased GFR (HCC)    Creatinine of 1.5 in 03/2009, STAGE 4 now  . Degenerative joint disease   . Depression    hx of  . Diabetes mellitus, type II ([place]) 12/10/2012.  . Diastolic heart failure    Pedal edema, "a long time ago"  . Erectile dysfunction   . Flu 2013   hx of  . GERD (gastroesophageal reflux disease)   . Gout   . Hemorrhoids   . High cholesterol   . Hypertension    Exercise induced  . Myocardial infarction ([place])   . NSVT (nonsustained ventricular tachycardia) (HCC)    Exercise induced  . Obesity   . 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 Tubular adenoma 2014    Past Surgical History:  Procedure Laterality Date  . BACK SURGERY  2007   x2  . BIOPSY  02/05/2021   Procedure: BIOPSY;  Surgeon: Daneil Dolin, MD;  Location: [facility];  Service: Endoscopy;;  . CARDIAC CATHETERIZATION     with stent placement  . COLONOSCOPY WITH ESOPHAGOGASTRODUODENOSCOPY (EGD) N/A 11/23/2013   Dr. Oneida Alar- TCS= left sided colitis and  mild proctitis likely due to ischemic colitis, moderate internal hemorrhoids, small nodule in the rectum, bx=tubular adenoma, EGD=-mild non-erosive gastritis  . COLONOSCOPY WITH PROPOFOL N/A 07/24/2020   hemorrhoids,  rectosigmoid lipoma, small adenoma removed.  . ESOPHAGOGASTRODUODENOSCOPY (EGD) WITH PROPOFOL N/A 02/05/2021   Normal esophagus, erythematous mucosa in entire stomach, no obvious ulcer, s/p biopsy. Normal duodenum. Negative H.pylori.   Marland Kitchen HERNIA REPAIR    . LEFT HEART CATHETERIZATION WITH CORONARY ANGIOGRAM N/A 11/16/2012   Procedure: LEFT HEART CATHETERIZATION WITH CORONARY ANGIOGRAM;  Surgeon: Sherren Mocha, MD;  Location: [facility];  Service: Cardiovascular;  Laterality: N/A;  . PERCUTANEOUS CORONARY STENT INTERVENTION (PCI-S)  11/16/2012   Procedure: PERCUTANEOUS CORONARY STENT INTERVENTION (PCI-S);  Surgeon: Sherren Mocha, MD;  Location: [facility] CATH LAB;  Service: Cardiovascular;;  . POLYPECTOMY  07/24/2020   Procedure: POLYPECTOMY;  Surgeon: Daneil Dolin, MD;  Location: [facility];  Service: Endoscopy;;  . TOTAL KNEE ARTHROPLASTY Right 11/15/2013   Procedure: RIGHT TOTAL KNEE ARTHROPLASTY;  Surgeon: Mauri Pole, MD;  Location: [facility];  Service: Orthopedics;  Laterality: Right;  . TOTAL KNEE ARTHROPLASTY Left 03/07/2015   Procedure: LEFT TOTAL KNEE ARTHROPLASTY;  Surgeon: Paralee Cancel, MD;  Location: [facility];  Service: Orthopedics;  Laterality: Left;  Social History   Socioeconomic History  . Marital status: Widowed    Spouse name: Not on file  . Number of children: Not on file  . Years of education: Not on file  . Highest education level: Not on file  Occupational History  . Occupation: Disabled    Employer: RETIRED  Tobacco Use  . Smoking status: Former Smoker    Packs/day: 1.00    Years: 30.00    Pack years: 30.00    Types: Cigarettes    Start date: 01/22/1965    Quit date: 12/23/1992    Years since quitting: 28.2  . Smokeless tobacco: Never Used  Vaping  Use  . Vaping Use: Never used  Substance and Sexual Activity  . Alcohol use: No    Alcohol/week: 0.0 standard drinks  . Drug use: No  . Sexual activity: Not Currently  Other Topics Concern  . Not on file  Social History Narrative   Married   Right handed   One story home no caffeine   Social Determinants of Health   Financial Resource Strain: Not on file  Food Insecurity: Not on file  Transportation Needs: Not on file  Physical Activity: Not on file  Stress: Not on file  Social Connections: Not on file  Intimate Partner Violence: Not on file    Current Outpatient Medications on File Prior to Visit  Medication Sig Dispense Refill  . acarbose (PRECOSE) 50 MG tablet Take 50 mg by mouth in the morning, at noon, and at bedtime.     Marland Kitchen acetaminophen (TYLENOL) 650 MG CR tablet Take 1,300 mg by mouth every 8 (eight) hours as needed for pain.    Marland Kitchen allopurinol (ZYLOPRIM) 100 MG tablet Take 100 mg by mouth daily.    Marland Kitchen amLODipine (NORVASC) 10 MG tablet Take 10 mg by mouth every evening.     Marland Kitchen aspirin EC 81 MG tablet Take 1 tablet (81 mg total) by mouth daily. 90 tablet 3  . calcitRIOL (ROCALTROL) 0.25 MCG capsule Take 0.25 mcg by mouth every Sunday.     . cholecalciferol (VITAMIN D) 1000 units tablet Take 1,000 Units daily by mouth.    . cloNIDine (CATAPRES) 0.1 MG tablet Take 0.1 mg by mouth 3 (three) times daily.    Marland Kitchen dicyclomine (BENTYL) 10 MG capsule Take 10 mg by mouth 3 (three) times daily.    . furosemide (LASIX) 40 MG tablet Take 1 tablet (40 mg total) by mouth 2 (two) times daily. 60 tablet 3  . gabapentin (NEURONTIN) 300 MG capsule Take 300 mg by mouth 3 (three) times daily.     Marland Kitchen HYDROcodone-acetaminophen (NORCO) 10-325 MG tablet Take 1-2 tablets by mouth 4 (four) times daily as needed for moderate pain or severe pain. 10 tablet 0  . linaclotide (LINZESS) 290 MCG CAPS capsule Take 1 capsule (290 mcg total) by mouth daily before breakfast. 30 capsule 11  . losartan (COZAAR) 25 MG  tablet Take 25 mg by mouth daily.    . meclizine (ANTIVERT) 25 MG tablet Take 25 mg by mouth 3 (three) times daily as needed for dizziness.    . nitroGLYCERIN (NITROSTAT) 0.4 MG SL tablet PLACE ONE TABLET UNDER THE TONGUE EVERY 5 MINUTES FOR 3 DOSES AS NEEDED (Patient taking differently: Place 0.4 mg under the tongue every 5 (five) minutes as needed for chest pain.) 25 tablet 3  . ondansetron (ZOFRAN) 4 MG tablet TAKE ONE TABLET (4MG TOTAL) BY MOUTH EVERY 8 HOURS AS NEEDED FOR NAUSEA OR  VOMITING 30 tablet 3  . pantoprazole (PROTONIX) 40 MG tablet Take 1 tablet (40 mg total) by mouth 2 (two) times daily before a meal. 60 tablet 3  . pravastatin (PRAVACHOL) 20 MG tablet Take 1 tablet (20 mg total) by mouth every evening. 90 tablet 3  . Probiotic Product (PROBIOTIC DAILY PO) Take 1 capsule by mouth daily.    . TRUE METRIX BLOOD GLUCOSE TEST test strip USE TO TEST BLOOD SUGAR UP TO TWICE DAILY OR AS DIRECTED 180 each 3   No current facility-administered medications on file prior to visit.    Allergies  Allergen Reactions  . Niacin And Related Other (See Comments)    Reaction: flushing and burning side effects even with extended release    Family History  Problem Relation Age of Onset  . Hypertension Other   . Cancer Other   . Heart disease Other   . Diabetes Other   . Hypertension Mother   . Cancer Father        brain  . CAD Brother   . Colon cancer Neg Hx     BP 132/62 (BP Location: Right Arm, Patient Position: Sitting, Cuff Size: Large)   Pulse 60   Ht 6' 2"  (1.88 m)   Wt 276 lb 9.6 oz (125.5 kg)   SpO2 94%   BMI 35.51 kg/m    Review of Systems He denies hypoglycemia.      Objective:   Physical Exam VITAL SIGNS:  See vs page GENERAL: no distress Pulses: dorsalis pedis absent bilat (poss due to edema) MSK: no deformity of the feet CV: 2+ bilat leg edema.   Skin:  no ulcer on the feet, but the skin is thickened and dry.  normal color and temp on the feet.  Old healed  surgical scar on the right foot.   Neuro: sensation is intact to touch on the feet, but decreased from normal.  Ext: there is bilateral onychomycosis of the toenails.     Lab Results  Component Value Date   HGBA1C 7.3 (A) 02/28/2021       Assessment & Plan:  Insulin-requiring type 2 DM, with stage 4 CRI: this is the best control this pt should aim for, given this regimen, which does match insulin to his changing needs throughout the day.    Patient Instructions  check your blood sugar twice a day.  vary the time of day when you check, between before the 3 meals, and at bedtime.  also check if you have symptoms of your blood sugar being too high or too low.  please keep a record of the readings and bring it to your next appointment here (or you can bring the meter itself).  You can write it on any piece of paper.  please call us sooner if your blood sugar goes below 70, or if you have a lot of readings over 200.   Please continue the same insulin: 22 units each morning.   Please come back for a follow-up appointment in 4 months.

## 2021-03-01 DIAGNOSIS — M159 Polyosteoarthritis, unspecified: Secondary | ICD-10-CM | POA: Diagnosis not present

## 2021-03-01 DIAGNOSIS — G894 Chronic pain syndrome: Secondary | ICD-10-CM | POA: Diagnosis not present

## 2021-03-06 ENCOUNTER — Ambulatory Visit: Payer: Medicare Other | Admitting: Internal Medicine

## 2021-03-23 DIAGNOSIS — M159 Polyosteoarthritis, unspecified: Secondary | ICD-10-CM | POA: Diagnosis not present

## 2021-03-23 DIAGNOSIS — M1991 Primary osteoarthritis, unspecified site: Secondary | ICD-10-CM | POA: Diagnosis not present

## 2021-03-23 DIAGNOSIS — M5136 Other intervertebral disc degeneration, lumbar region: Secondary | ICD-10-CM | POA: Diagnosis not present

## 2021-03-23 DIAGNOSIS — E1140 Type 2 diabetes mellitus with diabetic neuropathy, unspecified: Secondary | ICD-10-CM | POA: Diagnosis not present

## 2021-03-23 DIAGNOSIS — R600 Localized edema: Secondary | ICD-10-CM | POA: Diagnosis not present

## 2021-03-27 DIAGNOSIS — N189 Chronic kidney disease, unspecified: Secondary | ICD-10-CM | POA: Diagnosis not present

## 2021-03-27 DIAGNOSIS — E1122 Type 2 diabetes mellitus with diabetic chronic kidney disease: Secondary | ICD-10-CM | POA: Diagnosis not present

## 2021-03-27 DIAGNOSIS — R809 Proteinuria, unspecified: Secondary | ICD-10-CM | POA: Diagnosis not present

## 2021-03-27 DIAGNOSIS — I129 Hypertensive chronic kidney disease with stage 1 through stage 4 chronic kidney disease, or unspecified chronic kidney disease: Secondary | ICD-10-CM | POA: Diagnosis not present

## 2021-03-27 DIAGNOSIS — I5032 Chronic diastolic (congestive) heart failure: Secondary | ICD-10-CM | POA: Diagnosis not present

## 2021-03-28 ENCOUNTER — Other Ambulatory Visit (HOSPITAL_COMMUNITY): Payer: Self-pay | Admitting: Nephrology

## 2021-03-28 ENCOUNTER — Other Ambulatory Visit: Payer: Self-pay | Admitting: Nephrology

## 2021-03-28 DIAGNOSIS — E1122 Type 2 diabetes mellitus with diabetic chronic kidney disease: Secondary | ICD-10-CM

## 2021-03-28 DIAGNOSIS — I129 Hypertensive chronic kidney disease with stage 1 through stage 4 chronic kidney disease, or unspecified chronic kidney disease: Secondary | ICD-10-CM

## 2021-03-28 DIAGNOSIS — D638 Anemia in other chronic diseases classified elsewhere: Secondary | ICD-10-CM

## 2021-03-28 DIAGNOSIS — N170 Acute kidney failure with tubular necrosis: Secondary | ICD-10-CM

## 2021-03-28 DIAGNOSIS — E211 Secondary hyperparathyroidism, not elsewhere classified: Secondary | ICD-10-CM | POA: Diagnosis not present

## 2021-03-28 DIAGNOSIS — E1129 Type 2 diabetes mellitus with other diabetic kidney complication: Secondary | ICD-10-CM | POA: Diagnosis not present

## 2021-03-28 DIAGNOSIS — I5033 Acute on chronic diastolic (congestive) heart failure: Secondary | ICD-10-CM | POA: Diagnosis not present

## 2021-03-28 DIAGNOSIS — N189 Chronic kidney disease, unspecified: Secondary | ICD-10-CM | POA: Diagnosis not present

## 2021-03-28 DIAGNOSIS — R809 Proteinuria, unspecified: Secondary | ICD-10-CM | POA: Diagnosis not present

## 2021-04-04 ENCOUNTER — Ambulatory Visit (HOSPITAL_COMMUNITY)
Admission: RE | Admit: 2021-04-04 | Discharge: 2021-04-04 | Disposition: A | Discharge status: Home / Self Care | Payer: Medicare Other | Source: Ambulatory Visit | Attending: Nephrology | Admitting: Nephrology

## 2021-04-04 ENCOUNTER — Other Ambulatory Visit: Payer: Self-pay

## 2021-04-04 DIAGNOSIS — E1129 Type 2 diabetes mellitus with other diabetic kidney complication: Secondary | ICD-10-CM | POA: Diagnosis not present

## 2021-04-04 DIAGNOSIS — I129 Hypertensive chronic kidney disease with stage 1 through stage 4 chronic kidney disease, or unspecified chronic kidney disease: Secondary | ICD-10-CM | POA: Diagnosis not present

## 2021-04-04 DIAGNOSIS — E1122 Type 2 diabetes mellitus with diabetic chronic kidney disease: Secondary | ICD-10-CM

## 2021-04-04 DIAGNOSIS — D638 Anemia in other chronic diseases classified elsewhere: Secondary | ICD-10-CM | POA: Insufficient documentation

## 2021-04-04 DIAGNOSIS — R809 Proteinuria, unspecified: Secondary | ICD-10-CM | POA: Diagnosis not present

## 2021-04-04 DIAGNOSIS — N170 Acute kidney failure with tubular necrosis: Secondary | ICD-10-CM | POA: Insufficient documentation

## 2021-04-04 DIAGNOSIS — N261 Atrophy of kidney (terminal): Secondary | ICD-10-CM | POA: Diagnosis not present

## 2021-04-04 DIAGNOSIS — N189 Chronic kidney disease, unspecified: Secondary | ICD-10-CM | POA: Diagnosis not present

## 2021-04-04 DIAGNOSIS — N281 Cyst of kidney, acquired: Secondary | ICD-10-CM | POA: Diagnosis not present

## 2021-04-10 DIAGNOSIS — E1122 Type 2 diabetes mellitus with diabetic chronic kidney disease: Secondary | ICD-10-CM | POA: Diagnosis not present

## 2021-04-10 DIAGNOSIS — I129 Hypertensive chronic kidney disease with stage 1 through stage 4 chronic kidney disease, or unspecified chronic kidney disease: Secondary | ICD-10-CM | POA: Diagnosis not present

## 2021-04-10 DIAGNOSIS — N170 Acute kidney failure with tubular necrosis: Secondary | ICD-10-CM | POA: Diagnosis not present

## 2021-04-10 DIAGNOSIS — N189 Chronic kidney disease, unspecified: Secondary | ICD-10-CM | POA: Diagnosis not present

## 2021-04-10 DIAGNOSIS — D519 Vitamin B12 deficiency anemia, unspecified: Secondary | ICD-10-CM | POA: Diagnosis not present

## 2021-04-10 DIAGNOSIS — E211 Secondary hyperparathyroidism, not elsewhere classified: Secondary | ICD-10-CM | POA: Diagnosis not present

## 2021-04-10 DIAGNOSIS — Z79899 Other long term (current) drug therapy: Secondary | ICD-10-CM | POA: Diagnosis not present

## 2021-04-12 DIAGNOSIS — E1122 Type 2 diabetes mellitus with diabetic chronic kidney disease: Secondary | ICD-10-CM | POA: Diagnosis not present

## 2021-04-12 DIAGNOSIS — M13841 Other specified arthritis, right hand: Secondary | ICD-10-CM | POA: Diagnosis not present

## 2021-04-12 DIAGNOSIS — I129 Hypertensive chronic kidney disease with stage 1 through stage 4 chronic kidney disease, or unspecified chronic kidney disease: Secondary | ICD-10-CM | POA: Diagnosis not present

## 2021-04-12 DIAGNOSIS — E211 Secondary hyperparathyroidism, not elsewhere classified: Secondary | ICD-10-CM | POA: Diagnosis not present

## 2021-04-12 DIAGNOSIS — N189 Chronic kidney disease, unspecified: Secondary | ICD-10-CM | POA: Diagnosis not present

## 2021-04-12 DIAGNOSIS — D638 Anemia in other chronic diseases classified elsewhere: Secondary | ICD-10-CM | POA: Diagnosis not present

## 2021-04-12 DIAGNOSIS — N170 Acute kidney failure with tubular necrosis: Secondary | ICD-10-CM | POA: Diagnosis not present

## 2021-04-12 DIAGNOSIS — E1129 Type 2 diabetes mellitus with other diabetic kidney complication: Secondary | ICD-10-CM | POA: Diagnosis not present

## 2021-04-12 DIAGNOSIS — R809 Proteinuria, unspecified: Secondary | ICD-10-CM | POA: Diagnosis not present

## 2021-04-12 DIAGNOSIS — L309 Dermatitis, unspecified: Secondary | ICD-10-CM | POA: Diagnosis not present

## 2021-04-20 DIAGNOSIS — M109 Gout, unspecified: Secondary | ICD-10-CM | POA: Diagnosis not present

## 2021-04-21 ENCOUNTER — Other Ambulatory Visit: Payer: Self-pay | Admitting: Gastroenterology

## 2021-04-24 ENCOUNTER — Telehealth: Payer: Self-pay

## 2021-04-24 MED ORDER — ONDANSETRON HCL 4 MG PO TABS: ORAL_TABLET | ORAL | 3 refills | Status: DC

## 2021-04-24 NOTE — Telephone Encounter (Signed)
Completed.

## 2021-04-24 NOTE — Addendum Note (Signed)
Addended by: Annitta Needs on: 04/24/2021 03:12 PM   Modules accepted: Orders

## 2021-04-24 NOTE — Telephone Encounter (Signed)
Received a call from [facility]. Pt needs a refill of zofran.

## 2021-04-30 DIAGNOSIS — E211 Secondary hyperparathyroidism, not elsewhere classified: Secondary | ICD-10-CM | POA: Diagnosis not present

## 2021-04-30 DIAGNOSIS — N189 Chronic kidney disease, unspecified: Secondary | ICD-10-CM | POA: Diagnosis not present

## 2021-04-30 DIAGNOSIS — I129 Hypertensive chronic kidney disease with stage 1 through stage 4 chronic kidney disease, or unspecified chronic kidney disease: Secondary | ICD-10-CM | POA: Diagnosis not present

## 2021-04-30 DIAGNOSIS — E1122 Type 2 diabetes mellitus with diabetic chronic kidney disease: Secondary | ICD-10-CM | POA: Diagnosis not present

## 2021-04-30 DIAGNOSIS — N170 Acute kidney failure with tubular necrosis: Secondary | ICD-10-CM | POA: Diagnosis not present

## 2021-05-04 DIAGNOSIS — N170 Acute kidney failure with tubular necrosis: Secondary | ICD-10-CM | POA: Diagnosis not present

## 2021-05-04 DIAGNOSIS — E1129 Type 2 diabetes mellitus with other diabetic kidney complication: Secondary | ICD-10-CM | POA: Diagnosis not present

## 2021-05-04 DIAGNOSIS — N189 Chronic kidney disease, unspecified: Secondary | ICD-10-CM | POA: Diagnosis not present

## 2021-05-04 DIAGNOSIS — R809 Proteinuria, unspecified: Secondary | ICD-10-CM | POA: Diagnosis not present

## 2021-05-04 DIAGNOSIS — E211 Secondary hyperparathyroidism, not elsewhere classified: Secondary | ICD-10-CM | POA: Diagnosis not present

## 2021-05-04 DIAGNOSIS — D638 Anemia in other chronic diseases classified elsewhere: Secondary | ICD-10-CM | POA: Diagnosis not present

## 2021-05-04 DIAGNOSIS — E1122 Type 2 diabetes mellitus with diabetic chronic kidney disease: Secondary | ICD-10-CM | POA: Diagnosis not present

## 2021-05-04 DIAGNOSIS — I5032 Chronic diastolic (congestive) heart failure: Secondary | ICD-10-CM | POA: Diagnosis not present

## 2021-05-04 DIAGNOSIS — I129 Hypertensive chronic kidney disease with stage 1 through stage 4 chronic kidney disease, or unspecified chronic kidney disease: Secondary | ICD-10-CM | POA: Diagnosis not present

## 2021-05-07 DIAGNOSIS — M5442 Lumbago with sciatica, left side: Secondary | ICD-10-CM | POA: Diagnosis not present

## 2021-05-11 ENCOUNTER — Other Ambulatory Visit: Payer: Self-pay | Admitting: Gastroenterology

## 2021-05-16 ENCOUNTER — Ambulatory Visit: Payer: Medicare Other | Admitting: Gastroenterology

## 2021-05-17 DIAGNOSIS — M1A9XX1 Chronic gout, unspecified, 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ophi): Secondary | ICD-10-CM | POA: Diagnosis not present

## 2021-05-17 DIAGNOSIS — M5136 Other intervertebral disc degeneration, lumbar region: Secondary | ICD-10-CM | POA: Diagnosis not present

## 2021-05-17 DIAGNOSIS — M14672 Charcot's joint, left ankle and foot: Secondary | ICD-10-CM | POA: Diagnosis not present

## 2021-05-17 DIAGNOSIS — R600 Localized edema: Secondary | ICD-10-CM | POA: Diagnosis not present

## 2021-05-17 DIAGNOSIS — M150 Primary generalized (osteo)arthritis: Secondary | ICD-10-CM | POA: Diagnosis not present

## 2021-05-22 ENCOUNTER — Encounter (HOSPITAL_COMMUNITY): Payer: Self-pay | Admitting: Emergency Medicine

## 2021-05-22 ENCOUNTER — Emergency Department (HOSPITAL_COMMUNITY): Payer: Medicare Other

## 2021-05-22 ENCOUNTER — Other Ambulatory Visit: Payer: Self-pay

## 2021-05-22 ENCOUNTER — Emergency Department (HOSPITAL_COMMUNITY)
Admission: EM | Admit: 2021-05-22 | Discharge: 2021-05-22 | Disposition: A | Discharge status: Home / Self Care | Payer: Medicare Other | Attending: Emergency Medicine | Admitting: Emergency Medicine

## 2021-05-22 DIAGNOSIS — R1084 Generalized abdominal pain: Secondary | ICD-10-CM | POA: Diagnosis not present

## 2021-05-22 DIAGNOSIS — Z7982 Long term (current) use of aspirin: Secondary | ICD-10-CM | POA: Insufficient documentation

## 2021-05-22 DIAGNOSIS — Z794 Long term (current) use of insulin: Secondary | ICD-10-CM | POA: Insufficient documentation

## 2021-05-22 DIAGNOSIS — R6883 Chills (without fever): Secondary | ICD-10-CM | POA: Insufficient documentation

## 2021-05-22 DIAGNOSIS — Z79899 Other long term (current) drug therapy: Secondary | ICD-10-CM | POA: Insufficient documentation

## 2021-05-22 DIAGNOSIS — I5032 Chronic diastolic (congestive) heart failure: Secondary | ICD-10-CM | POA: Insufficient documentation

## 2021-05-22 DIAGNOSIS — Z96653 Presence of artificial knee joint, bilateral: Secondary | ICD-10-CM | POA: Diagnosis not present

## 2021-05-22 DIAGNOSIS — N183 Chronic kidney disease, stage 3 unspecified: Secondary | ICD-10-CM | POA: Insufficient documentation

## 2021-05-22 DIAGNOSIS — Z87891 Personal history of nicotine dependence: Secondary | ICD-10-CM | POA: Insufficient documentation

## 2021-05-22 DIAGNOSIS — R109 Unspecified abdominal pain: Secondary | ICD-10-CM | POA: Diagnosis present

## 2021-05-22 DIAGNOSIS — I130 Hypertensive heart and chronic kidney disease with heart failure and stage 1 through stage 4 chronic kidney disease, or unspecified chronic kidney disease: Secondary | ICD-10-CM | POA: Diagnosis not present

## 2021-05-22 DIAGNOSIS — I2510 Atherosclerotic heart disease of native coronary artery without angina pectoris: Secondary | ICD-10-CM | POA: Diagnosis not present

## 2021-05-22 DIAGNOSIS — K5900 Constipation, unspecified: Secondary | ICD-10-CM | POA: Diagnosis not present

## 2021-05-22 DIAGNOSIS — E1122 Type 2 diabetes mellitus with diabetic chronic kidney disease: Secondary | ICD-10-CM | POA: Diagnosis not present

## 2021-05-22 DIAGNOSIS — N281 Cyst of kidney, acquired: Secondary | ICD-10-CM | POA: Diagnosis not present

## 2021-05-22 DIAGNOSIS — R110 Nausea: Secondary | ICD-10-CM | POA: Diagnosis not present

## 2021-05-22 DIAGNOSIS — K769 Liver disease, unspecified: Secondary | ICD-10-CM | POA: Diagnosis not present

## 2021-05-22 DIAGNOSIS — I10 Essential (primary) hypertension: Secondary | ICD-10-CM | POA: Diagnosis not present

## 2021-05-22 LAB — URINALYSIS, ROUTINE W REFLEX MICROSCOPIC
Bilirubin Urine: NEGATIVE
Glucose, UA: NEGATIVE mg/dL
Hgb urine dipstick: NEGATIVE
Ketones, ur: NEGATIVE mg/dL
Leukocytes,Ua: NEGATIVE
Nitrite: NEGATIVE
Protein, ur: NEGATIVE mg/dL
Specific Gravity, Urine: 1.005 (ref 1.005–1.030)
pH: 7 (ref 5.0–8.0)

## 2021-05-22 LAB — CBC WITH DIFFERENTIAL/PLATELET
Abs Immature Granulocytes: 0.02 10*3/uL (ref 0.00–0.07)
Basophils Absolute: 0 10*3/uL (ref 0.0–0.1)
Basophils Relative: 1 %
Eosinophils Absolute: 0.2 10*3/uL (ref 0.0–0.5)
Eosinophils Relative: 3 %
HCT: 36.8 % — ABNORMAL LOW (ref 39.0–52.0)
Hemoglobin: 12 g/dL — ABNORMAL LOW (ref 13.0–17.0)
Immature Granulocytes: 0 %
Lymphocytes Relative: 22 %
Lymphs Abs: 1.6 10*3/uL (ref 0.7–4.0)
MCH: 32.7 pg (ref 26.0–34.0)
MCHC: 32.6 g/dL (ref 30.0–36.0)
MCV: 100.3 fL — ABNORMAL HIGH (ref 80.0–100.0)
Monocytes Absolute: 0.6 10*3/uL (ref 0.1–1.0)
Monocytes Relative: 8 %
Neutro Abs: 4.8 10*3/uL (ref 1.7–7.7)
Neutrophils Relative %: 66 %
Platelets: 199 10*3/uL (ref 150–400)
RBC: 3.67 MIL/uL — ABNORMAL LOW (ref 4.22–5.81)
RDW: 14.1 % (ref 11.5–15.5)
WBC: 7.2 10*3/uL (ref 4.0–10.5)
nRBC: 0 % (ref 0.0–0.2)

## 2021-05-22 LAB — LIPASE, BLOOD: Lipase: 22 U/L (ref 11–51)

## 2021-05-22 LAB — COMPREHENSIVE METABOLIC PANEL
ALT: 12 U/L (ref 0–44)
AST: 19 U/L (ref 15–41)
Albumin: 4.1 g/dL (ref 3.5–5.0)
Alkaline Phosphatase: 83 U/L (ref 38–126)
Anion gap: 10 (ref 5–15)
BUN: 40 mg/dL — ABNORMAL HIGH (ref 8–23)
CO2: 28 mmol/L (ref 22–32)
Calcium: 9.3 mg/dL (ref 8.9–10.3)
Chloride: 100 mmol/L (ref 98–111)
Creatinine, Ser: 2.81 mg/dL — ABNORMAL HIGH (ref 0.61–1.24)
GFR, Estimated: 23 mL/min — ABNORMAL LOW (ref >60)
Glucose, Bld: 125 mg/dL — ABNORMAL HIGH (ref 70–99)
Potassium: 3.6 mmol/L (ref 3.5–5.1)
Sodium: 138 mmol/L (ref 135–145)
Total Bilirubin: 0.6 mg/dL (ref 0.3–1.2)
Total Protein: 7.9 g/dL (ref 6.5–8.1)

## 2021-05-22 MED ORDER — HYDROCODONE-ACETAMINOPHEN 5-325 MG PO TABS: 1.0000 | ORAL_TABLET | Freq: Once | ORAL | Status: DC

## 2021-05-22 MED ORDER — MORPHINE SULFATE (PF) 4 MG/ML IV SOLN: 4.0000 mg | Freq: Once | INTRAVENOUS | Status: DC

## 2021-05-22 MED ORDER — ONDANSETRON HCL 4 MG/2ML IJ SOLN: 4.0000 mg | Freq: Once | INTRAMUSCULAR | Status: DC

## 2021-05-22 NOTE — ED Provider Notes (Signed)
I provided a substantive portion of the care of this patient.  I personally performed the entirety of the history, exam and medical decision making for this encounter.  EKG Interpretation  Date/Time:  Tuesday May 22 2021 12:16:36 EDT Ventricular Rate:  58 PR Interval:  464 QRS Duration: 103 QT Interval:  447 QTC Calculation: 439 R Axis:   -11 Text Interpretation: Sinus rhythm Prolonged PR interval Anteroseptal infarct, old Nonspecific T abnormalities, lateral leads No significant change since last tracing Confirmed by Fredia Sorrow [phone]) on 05/22/2021 12:30:40 PM   Patient with a history of chronic abdominal pain.  Followed by Dr. Gala Romney from gastroenterology.  Has been on Linzess.  States that he is just feeling sick all the time.  Today's work-up without any acute findings no leukocytosis.  No significant anemia urinalysis normal.  But complete metabolic panel and lipase is pending.  CT scan of the abdomen shows a little bit of stool burden but nothing acute.  Complete metabolic panel and lipase has no significant findings patient can be discharged follow back up with Dr. Gala Romney.   Fredia Sorrow, MD 05/22/21 1341

## 2021-05-22 NOTE — ED Notes (Signed)
Dr Rogene Houston in to see pt.

## 2021-05-22 NOTE — ED Provider Notes (Signed)
[facility] EMERGENCY DEPARTMENT Provider Note   CSN: [id number] Arrival date & time: 05/22/21  4132     History Chief Complaint  Patient presents with  . Abdominal Cramping    Sick for 6 month with abdominal pain.  Not able to see GI today, told to come to ED.      [name] is a 75 y.o. male with a past medical history significant for benign prostatic hypertrophy, CKD stage III, CAD with history of NSTEMI, diabetes, diastolic heart failure, GERD, hyperlipidemia, hypertension, nonsustained ventricular tachycardia, and tobacco abuse who presents to the ED due to acute on chronic diffuse abdominal pain that has been intermittent in nature for the past 6 to 7 months.  Patient is being followed by [facility] for chronic GERD, constipation, and abdominal pain. He was started on Linzess roughly 6-7 months ago with no improvement in symptoms.  He describes his pain as a dull, intermittent sensation that is most significant in lower abdomen.  Admits to associated nausea and chills.  Denies vomiting, diarrhea, and fever.  Patient denies hematemesis, melena, and hematochezia.  Chart reviewed.  Patient had a CT abdomen/pelvis without contrast in November 2021 with no acute findings.  He also had a CTA in January 2022 to rule out mesenteric ischemia which was negative for chronic mesenteric ischemia; however, demonstrated mesenteric arterial disease.  Per GI note, patient has been encourages to take gas-x as needed and to limit dairy/fried foods. He notes he is prescribed Norco for his chronic low back pain which he has been taking with no improvement in abdominal pain.  No aggravating or alleviating symptoms. Patient denies associated chest pain and shortness of breath. No previous abdominal operations.  History obtained from patient and past medical records. No interpreter used during encounter.      Past Medical History:  Diagnosis Date  . Anxiety   . Benign  prostatic hypertrophy    Nocturia  . CAD (coronary artery disease)    a.  NSTEMI 10/2012 s/p DES to LAD & DES to 1st diagonal with residual diffuse nonobstructive dz in LCx/RCA   . Chronic kidney disease, stage 3, mod decreased GFR (HCC)    Creatinine of 1.5 in 03/2009, STAGE 4 now  . Degenerative joint disease   . Depression    hx of  . Diabetes mellitus, type II ([place]) 12/10/2012.  . Diastolic heart failure    Pedal edema, "a long time ago"  . Erectile dysfunction   . Flu 2013   hx of  . GERD (gastroesophageal reflux disease)   . Gout   . Hemorrhoids   . High cholesterol   . Hypertension    Exercise induced  . Myocardial infarction ([place])   . NSVT (nonsustained ventricular tachycardia) (HCC)    Exercise induced  . Obesity   . 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 Tubular adenoma 2014    Patient Active Problem List   Diagnosis Date Noted  . Constipation 02/15/2021  . Abdominal pain 01/02/2021  . H/O adenomatous polyp of colon 05/05/2020  . Iron deficiency anemia 05/05/2020  . Acute gouty arthritis 10/25/2019  . Sepsis ([place]) 04/02/2019  . Leukocytosis 04/02/2019  . Cellulitis of right hand 04/01/2019  . Peripheral edema   . CHF (congestive heart failure) ([place]) 07/05/2015  . Acute congestive heart failure ([place]) 07/04/2015  . Acute on chronic diastolic congestive heart failure ([place]) 07/04/2015  . HLD (hyperlipidemia) 07/04/2015  .  Anxiety state 07/04/2015  . GERD (gastroesophageal reflux disease) 07/04/2015  . Heart block 07/04/2015  . Edema 03/11/2015  . Diastolic CHF, chronic ([place]) 03/11/2015  . 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 Muscle weakness (generalized) 01/13/2014  . Knee stiffness 01/13/2014  . Gout 12/11/2013  . Cellulitis 11/19/2013  . Expected blood loss anemia 11/17/2013  . Obese 11/17/2013  . S/P right TKA 11/15/2013  . Anemia, normocytic normochromic 01/12/2013  . Diabetes mellitus type 2 in obese ([place]) 12/10/2012  . CAD S/P  percutaneous coronary angioplasty 11/24/2012  . Overweight(278.02) 11/17/2012  . Obstructive sleep apnea 11/15/2012  . Chronic kidney disease, stage 3, cardiorenal syndrome 08/23/2011  . Essential hypertension     Past Surgical History:  Procedure Laterality Date  . BACK SURGERY  2007   x2  . BIOPSY  02/05/2021   Procedure: BIOPSY;  Surgeon: Daneil Dolin, MD;  Location: [facility] ENDO SUITE;  Service: Endoscopy;;  . CARDIAC CATHETERIZATION     with stent placement  . COLONOSCOPY WITH ESOPHAGOGASTRODUODENOSCOPY (EGD) N/A 11/23/2013   Dr. Oneida Alar- TCS= left sided colitis and mild proctitis likely due to ischemic colitis, moderate internal hemorrhoids, small nodule in the rectum, bx=tubular adenoma, EGD=-mild non-erosive gastritis  . COLONOSCOPY WITH PROPOFOL N/A 07/24/2020   hemorrhoids,  rectosigmoid lipoma, small adenoma removed.  . ESOPHAGOGASTRODUODENOSCOPY (EGD) WITH PROPOFOL N/A 02/05/2021   Normal esophagus, erythematous mucosa in entire stomach, no obvious ulcer, s/p biopsy. Normal duodenum. Negative H.pylori.   Marland Kitchen HERNIA REPAIR    . LEFT HEART CATHETERIZATION WITH CORONARY ANGIOGRAM N/A 11/16/2012   Procedure: LEFT HEART CATHETERIZATION WITH CORONARY ANGIOGRAM;  Surgeon: Sherren Mocha, MD;  Location: [facility] CATH LAB;  Service: Cardiovascular;  Laterality: N/A;  . PERCUTANEOUS CORONARY STENT INTERVENTION (PCI-S)  11/16/2012   Procedure: PERCUTANEOUS CORONARY STENT INTERVENTION (PCI-S);  Surgeon: Sherren Mocha, MD;  Location: [facility] CATH LAB;  Service: Cardiovascular;;  . POLYPECTOMY  07/24/2020   Procedure: POLYPECTOMY;  Surgeon: Daneil Dolin, MD;  Location: [facility] ENDO SUITE;  Service: Endoscopy;;  . TOTAL KNEE ARTHROPLASTY Right 11/15/2013   Procedure: RIGHT TOTAL KNEE ARTHROPLASTY;  Surgeon: Mauri Pole, MD;  Location: [facility] ORS;  Service: Orthopedics;  Laterality: Right;  . TOTAL KNEE ARTHROPLASTY Left 03/07/2015   Procedure: LEFT TOTAL KNEE ARTHROPLASTY;  Surgeon: Paralee Cancel, MD;  Location: [facility]  ORS;  Service: Orthopedics;  Laterality: Left;       Family History  Problem Relation Age of Onset  . Hypertension Other   . Cancer Other   . Heart disease Other   . Diabetes Other   . Hypertension Mother   . Cancer Father        brain  . CAD Brother   . Colon cancer Neg Hx     Social History   Tobacco Use  . Smoking status: Former Smoker    Packs/day: 1.00    Years: 30.00    Pack years: 30.00    Types: Cigarettes    Start date: 01/22/1965    Quit date: 12/23/1992    Years since quitting: 28.4  . Smokeless tobacco: Never Used  Vaping Use  . Vaping Use: Never used  Substance Use Topics  . Alcohol use: No    Alcohol/week: 0.0 standard drinks  . Drug use: No    Home Medications Prior to Admission medications   Medication Sig Start Date End Date Taking? Authorizing Provider  acarbose (PRECOSE) 50 MG tablet Take 50 mg by mouth in the morning, at noon, and at bedtime.  10/20/17   [provider]  acetaminophen (TYLENOL) 650 MG CR tablet Take 1,300 mg by mouth every 8 (eight) hours as needed for pain.    [provider]  allopurinol (ZYLOPRIM) 100 MG tablet Take 100 mg by mouth daily. 08/17/20   [provider]  amLODipine (NORVASC) 10 MG tablet Take 10 mg by mouth every evening.     [provider]  aspirin EC 81 MG tablet Take 1 tablet (81 mg total) by mouth daily. 09/14/19   Herminio Commons, MD  calcitRIOL (ROCALTROL) 0.25 MCG capsule Take 0.25 mcg by mouth every Sunday.  12/13/16   [provider]  cholecalciferol (VITAMIN D) 1000 units tablet Take 1,000 Units daily by mouth.    [provider]  cloNIDine (CATAPRES) 0.1 MG tablet Take 0.1 mg by mouth 3 (three) times daily. 02/15/20   [provider]  dicyclomine (BENTYL) 10 MG capsule Take 10 mg by mouth 3 (three) times daily.    [provider]  furosemide (LASIX) 40 MG tablet Take 1 tablet (40 mg total) by mouth 2 (two) times daily. 08/09/19    Herminio Commons, MD  gabapentin (NEURONTIN) 300 MG capsule Take 300 mg by mouth 3 (three) times daily.  10/19/19   [provider]  HYDROcodone-acetaminophen (NORCO) 10-325 MG tablet Take 1-2 tablets by mouth 4 (four) times daily as needed for moderate pain or severe pain. 10/28/19   Manuella Ghazi, Pratik D, DO  insulin NPH Human (NOVOLIN N) 100 UNIT/ML injection Inject 0.22 mLs (22 Units total) into the skin every morning. Ands syringes 1/day 02/28/21   Renato Shin, MD  linaclotide [facility]) 290 MCG CAPS capsule Take 1 capsule (290 mcg total) by mouth daily before breakfast. 12/21/20   Rourk, Cristopher Estimable, MD  losartan (COZAAR) 25 MG tablet Take 25 mg by mouth daily.    [provider]  meclizine (ANTIVERT) 25 MG tablet Take 25 mg by mouth 3 (three) times daily as needed for dizziness.    [provider]  nitroGLYCERIN (NITROSTAT) 0.4 MG SL tablet PLACE ONE TABLET UNDER THE TONGUE EVERY 5 MINUTES FOR 3 DOSES AS NEEDED Patient taking differently: Place 0.4 mg under the tongue every 5 (five) minutes as needed for chest pain. 07/22/17   Herminio Commons, MD  ondansetron (ZOFRAN) 4 MG tablet TAKE ONE TABLET (4MG TOTAL) BY MOUTH EVERY 8 HOURS AS NEEDED FOR NAUSEA OR VOMITING 04/24/21   Annitta Needs, NP  pantoprazole (PROTONIX) 40 MG tablet TAKE ONE TABLET (40MG TOTAL) BY MOUTH TWO TIMES DAILY BEFORE A MEAL 05/11/21   Erenest Rasher, PA-C  pravastatin (PRAVACHOL) 20 MG tablet Take 1 tablet (20 mg total) by mouth every evening. 09/14/19 07/12/21  Herminio Commons, MD  Probiotic Product (PROBIOTIC DAILY PO) Take 1 capsule by mouth daily.    [provider]  TRUE METRIX BLOOD GLUCOSE TEST test strip USE TO TEST BLOOD SUGAR UP TO TWICE DAILY OR AS DIRECTED 08/19/20   Renato Shin, MD    Allergies    Niacin and related  Review of Systems   Review of Systems  Constitutional: Negative for chills and fever.  Respiratory: Negative for shortness of breath.   Cardiovascular:  Negative for chest pain.  Gastrointestinal: Positive for abdominal pain and nausea. Negative for constipation, diarrhea and vomiting.  Genitourinary: Negative for dysuria.  All other systems reviewed and are negative.   Physical Exam Updated Vital Signs BP (!) 158/80   Pulse 68  Temp 98.4 F (36.9 C) (Oral)   Resp 14   Ht 6' 2"  (1.88 m)   Wt 120.7 kg   SpO2 99%   BMI 34.15 kg/m   Physical Exam Vitals and nursing note reviewed.  Constitutional:      General: He is not in acute distress.    Appearance: He is not ill-appearing.  HENT:     Head: Normocephalic.  Eyes:     Pupils: Pupils are equal, round, and reactive to light.  Cardiovascular:     Rate and Rhythm: Normal rate and regular rhythm.     Pulses: Normal pulses.     Heart sounds: Normal heart sounds. No murmur heard. No friction rub. No gallop.   Pulmonary:     Effort: Pulmonary effort is normal.     Breath sounds: Normal breath sounds.  Abdominal:     General: Abdomen is flat. There is no distension.     Palpations: Abdomen is soft.     Tenderness: There is abdominal tenderness. There is no guarding or rebound.     Comments: Diffuse abdominal pain without rebound or guarding.   Musculoskeletal:        General: Normal range of motion.     Cervical back: Neck supple.     Comments: 2+ pitting edema bilaterally.  Skin:    General: Skin is warm and dry.  Neurological:     General: No focal deficit present.     Mental Status: He is alert.  Psychiatric:        Mood and Affect: Mood normal.        Behavior: Behavior normal.     ED Results / Procedures / Treatments   Labs (all labs ordered are listed, but only abnormal results are displayed) Labs Reviewed  CBC WITH DIFFERENTIAL/PLATELET - Abnormal; Notable for the following components:      Result Value   RBC 3.67 (*)    Hemoglobin 12.0 (*)    HCT 36.8 (*)    MCV 100.3 (*)    All other components within normal limits  COMPREHENSIVE METABOLIC PANEL -  Abnormal; Notable for the following components:   Glucose, Bld 125 (*)    BUN 40 (*)    Creatinine, Ser 2.81 (*)    GFR, Estimated 23 (*)    All other components within normal limits  URINALYSIS, ROUTINE W REFLEX MICROSCOPIC - Abnormal; Notable for the following components:   Color, Urine COLORLESS (*)    All other components within normal limits  LIPASE, BLOOD    EKG EKG Interpretation  Date/Time:  Tuesday May 22 2021 12:16:36 EDT Ventricular Rate:  58 PR Interval:  464 QRS Duration: 103 QT Interval:  447 QTC Calculation: 439 R Axis:   -11 Text Interpretation: Sinus rhythm Prolonged PR interval Anteroseptal infarct, old Nonspecific T abnormalities, lateral leads No significant change since last tracing Confirmed by Fredia Sorrow [id number]) on 05/22/2021 12:30:40 PM   Radiology CT ABDOMEN PELVIS WO CONTRAST  Result Date: 05/22/2021 CLINICAL DATA:  Central abdominal pain, nausea, constipation EXAM: CT ABDOMEN AND PELVIS WITHOUT CONTRAST TECHNIQUE: Multidetector CT imaging of the abdomen and pelvis was performed following the standard protocol without IV contrast. COMPARISON:  01/03/2021 FINDINGS: Lower chest: Scarring and or atelectasis in the dependent bilateral lung bases. Hepatobiliary: No solid liver abnormality is seen. Numerous low-attenuation lesions throughout the liver, incompletely characterized, although not significantly changed compared to prior examination and likely simple cysts. No gallstones, gallbladder wall thickening, or biliary dilatation. Pancreas: Unremarkable.  No pancreatic ductal dilatation or surrounding inflammatory changes. Spleen: Normal in size without significant abnormality. Adrenals/Urinary Tract: Adrenal glands are unremarkable. Multiple fluid attenuation cysts of the kidneys bilaterally, previously characterized by contrast enhanced CT. Kidneys are otherwise normal, without renal calculi, solid lesion, or hydronephrosis. Bladder is unremarkable.  Stomach/Bowel: Stomach is within normal limits. Appendix appears normal. No evidence of bowel wall thickening, distention, or inflammatory changes. Moderate burden of stool in the right hemicolon, with scattered gas and stool present throughout the remainder to the rectum. Vascular/Lymphatic: Aortic atherosclerosis. No enlarged abdominal or pelvic lymph nodes. Reproductive: Mild prostatomegaly. Other: No abdominal wall hernia or abnormality. No abdominopelvic ascites. Musculoskeletal: No acute or significant osseous findings. IMPRESSION: 1. No acute noncontrast CT findings of the abdomen or pelvis to explain abdominal pain. 2. Moderate burden of stool in the right hemicolon, with scattered gas and stool present throughout the remainder to the rectum. 3. Mild prostatomegaly. Aortic Atherosclerosis (ICD10-I70.0). Electronically Signed   By: Eddie Candle M.D.   On: 05/22/2021 13:18    Procedures Procedures   Medications Ordered in ED Medications  ondansetron (ZOFRAN) injection 4 mg (has no administration in time range)  morphine 4 MG/ML injection 4 mg (has no administration in time range)    ED Course  I have reviewed the triage vital signs and the nursing notes.  Pertinent labs & imaging results that were available during my care of the patient were reviewed by me and considered in my medical decision making (see chart for details).    MDM Rules/Calculators/A&P                         75 year old male presents to the ED due to acute on chronic abdominal pain x6 to 7 months.  He is currently being followed by [facility]. He is chronically on Linzess with no relief of symptoms.  No fever, vomiting, or diarrhea. Patient had a CT abdomen/pelvis without contrast in November 2021 with no acute findings.  He also had a CTA in January 2022 to rule out mesenteric ischemia which was negative for chronic mesenteric ischemia; however, demonstrated mesenteric arterial disease. No  chest pain or shortness of breath.  Upon arrival, patient afebrile, not tachycardic or hypoxic.  Patient nontoxic-appearing.  Physical exam significant for diffuse abdominal tenderness without rebound or guarding.  Routine labs ordered.  CT abdomen to rule out emergent intraabdominal etiologies even though my suspicion is lower given the longevity of symptoms. This patient was evaluated during a time of global shortage of iodinated contrast media. Based on guidance from the [employer] of Radiology, best practices, and local institutional approaches an alternative path for evaluating and managing the patient may have been employed in order to provide optimal care during this shortage. The current situation has been discussed with the patient. Patient deferred IV pain and nausea medications.   EKG personally reviewed which demonstrates normal sinus rhythm and no major changes from previous EKG.  No signs of acute ischemia.  CBC reassuring with no leukocytosis.  Mild anemia with hemoglobin at 12.  CMP significant for elevated creatinine at 2.81 and BUN at 40 which appears slightly worse than baseline.  Hyperglycemia 125.  No anion gap.  UA negative for signs of infection.  CT abdomen personally reviewed which demonstrates: IMPRESSION:  1. No acute noncontrast CT findings of the abdomen or pelvis to  explain abdominal pain.  2. Moderate burden of stool in the right hemicolon, with scattered  gas and stool present throughout the remainder to the rectum.  3. Mild prostatomegaly.   Patient offered bentyl here in the ED but declined. Advised patient to follow-up with GI doctor for further evaluation. Recommended Miralax for constipation. Encouraged oral hydration given patient deferred IVFs. Advised patient to have renal function rechecked this week. Strict ED precautions discussed with patient. Patient states understanding and agrees to plan. Patient discharged home in no acute distress and stable  vitals  Discussed case with Dr. Rogene Houston who evaluated patient at bedside and agrees with assessment and plan.  Final Clinical Impression(s) / ED Diagnoses Final diagnoses:  Generalized abdominal pain    Rx / DC Orders ED Discharge Orders    None       Suzy Bouchard, PA-C 05/22/21 1408    Fredia Sorrow, MD 05/24/21 [phone]

## 2021-05-22 NOTE — ED Notes (Signed)
Not able to get IV site.

## 2021-05-22 NOTE — ED Notes (Signed)
Lab tech at bedside attempting labs

## 2021-05-22 NOTE — Discharge Instructions (Addendum)
It was a pleasure taking care of your today. As discussed, your CT scan showed some constipation. I recommend taking miralax daily. Discuss constipation regimens with your GI doctor. I recommend calling your GI doctor to schedule an appointment for further evaluation of your abdominal pain. Continue taking your medications as prescribed by your GI doctor. Return to the ER for new or worsening symptoms.    Please have your kidney function rechecked this week

## 2021-05-28 DIAGNOSIS — E1129 Type 2 diabetes mellitus with other diabetic kidney complication: Secondary | ICD-10-CM | POA: Diagnosis not present

## 2021-05-28 DIAGNOSIS — I5032 Chronic diastolic (congestive) heart failure: Secondary | ICD-10-CM | POA: Diagnosis not present

## 2021-05-28 DIAGNOSIS — K5900 Constipation, unspecified: Secondary | ICD-10-CM | POA: Diagnosis not present

## 2021-06-14 DIAGNOSIS — M1A9XX1 Chronic gout, unspecified, 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ophi): Secondary | ICD-10-CM | POA: Diagnosis not present

## 2021-06-14 DIAGNOSIS — M5136 Other intervertebral disc degeneration, lumbar region: Secondary | ICD-10-CM | POA: Diagnosis not present

## 2021-06-14 DIAGNOSIS — M14672 Charcot's joint, left ankle and foot: Secondary | ICD-10-CM | POA: Diagnosis not present

## 2021-06-14 DIAGNOSIS — H6123 Impacted cerumen, bilateral: Secondary | ICD-10-CM | POA: Diagnosis not present

## 2021-06-14 DIAGNOSIS — M150 Primary generalized (osteo)arthritis: Secondary | ICD-10-CM | POA: Diagnosis not present

## 2021-06-14 DIAGNOSIS — R600 Localized edema: Secondary | ICD-10-CM | POA: Diagnosis not present

## 2021-06-19 DIAGNOSIS — N170 Acute kidney failure with tubular necrosis: Secondary | ICD-10-CM | POA: Diagnosis not present

## 2021-06-19 DIAGNOSIS — I129 Hypertensive chronic kidney disease with stage 1 through stage 4 chronic kidney disease, or unspecified chronic kidney disease: Secondary | ICD-10-CM | POA: Diagnosis not present

## 2021-06-19 DIAGNOSIS — E211 Secondary hyperparathyroidism, not elsewhere classified: Secondary | ICD-10-CM | POA: Diagnosis not present

## 2021-06-19 DIAGNOSIS — E1122 Type 2 diabetes mellitus with diabetic chronic kidney disease: Secondary | ICD-10-CM | POA: Diagnosis not present

## 2021-06-19 DIAGNOSIS — N189 Chronic kidney disease, unspecified: Secondary | ICD-10-CM | POA: Diagnosis not present

## 2021-06-22 DIAGNOSIS — N189 Chronic kidney disease, unspecified: Secondary | ICD-10-CM | POA: Diagnosis not present

## 2021-06-22 DIAGNOSIS — I5032 Chronic diastolic (congestive) heart failure: Secondary | ICD-10-CM | POA: Diagnosis not present

## 2021-06-22 DIAGNOSIS — E1122 Type 2 diabetes mellitus with diabetic chronic kidney disease: Secondary | ICD-10-CM | POA: Diagnosis not present

## 2021-06-22 DIAGNOSIS — E1129 Type 2 diabetes mellitus with other diabetic kidney complication: Secondary | ICD-10-CM | POA: Diagnosis not present

## 2021-06-22 DIAGNOSIS — R809 Proteinuria, unspecified: Secondary | ICD-10-CM | POA: Diagnosis not present

## 2021-06-22 DIAGNOSIS — I129 Hypertensive chronic kidney disease with stage 1 through stage 4 chronic kidney disease, or unspecified chronic kidney disease: Secondary | ICD-10-CM | POA: Diagnosis not present

## 2021-06-22 DIAGNOSIS — D638 Anemia in other chronic diseases classified elsewhere: Secondary | ICD-10-CM | POA: Diagnosis not present

## 2021-06-22 DIAGNOSIS — E211 Secondary hyperparathyroidism, not elsewhere classified: Secondary | ICD-10-CM | POA: Diagnosis not present

## 2021-06-26 DIAGNOSIS — E1129 Type 2 diabetes mellitus with other diabetic kidney complication: Secondary | ICD-10-CM | POA: Diagnosis not present

## 2021-06-26 DIAGNOSIS — G894 Chronic pain syndrome: Secondary | ICD-10-CM | POA: Diagnosis not present

## 2021-06-26 DIAGNOSIS — K5909 Other constipation: Secondary | ICD-10-CM | POA: Diagnosis not present

## 2021-06-26 DIAGNOSIS — R109 Unspecified abdominal pain: Secondary | ICD-10-CM | POA: Diagnosis not present

## 2021-06-26 DIAGNOSIS — N183 Chronic kidney disease, stage 3 unspecified: Secondary | ICD-10-CM | POA: Diagnosis not present

## 2021-06-27 ENCOUNTER — Other Ambulatory Visit: Payer: Self-pay

## 2021-06-27 ENCOUNTER — Ambulatory Visit (INDEPENDENT_AMBULATORY_CARE_PROVIDER_SITE_OTHER): Payer: Medicare Other | Admitting: Endocrinology

## 2021-06-27 VITALS — BP 156/70 | HR 90 | Ht 74.0 in | Wt 266.8 lb

## 2021-06-27 DIAGNOSIS — E669 Obesity, unspecified: Secondary | ICD-10-CM

## 2021-06-27 DIAGNOSIS — E1169 Type 2 diabetes mellitus with other specified complication: Secondary | ICD-10-CM | POA: Diagnosis not present

## 2021-06-27 LAB — POCT GLYCOSYLATED HEMOGLOBIN (HGB A1C): Hemoglobin A1C: 6.6 % — AB (ref 4.0–5.6)

## 2021-06-27 MED ORDER — INSULIN NPH (HUMAN) (ISOPHANE) 100 UNIT/ML ~~LOC~~ SUSP: 18.0000 [IU] | SUBCUTANEOUS | 11 refills | Status: DC

## 2021-06-27 NOTE — Patient Instructions (Addendum)
check your blood sugar twice a day.  vary the time of day when you check, between before the 3 meals, and at bedtime.  also check if you have symptoms of your blood sugar being too high or too low.  please keep a record of the readings and bring it to your next appointment here (or you can bring the meter itself).  You can write it on any piece of paper.  please call us sooner if your blood sugar goes below 70, or if you have a lot of readings over 200.    Please reduce the insulin to 18 units each morning.  Please come back for a follow-up appointment in 3 months.

## 2021-06-27 NOTE — Progress Notes (Signed)
Subjective:    Patient ID: [name], male    DOB: 06-05-1946, 75 y.o.   MRN: [id number]  HPI Pt returns for f/u of diabetes mellitus:  DM type: Insulin-requiring type 2 Dx'ed: 5726 Complications: CAD, PAD, and stage 4 CRI.   Therapy: insulin since 2020, and acarbose.   DKA: never Severe hypoglycemia: never.  Pancreatitis: never Pancreatic imaging: normal on 2017 CT SDOH: he declines brand name meds, due to cost.   Other: He did not tolerate metformin (nausea); he declines multiple daily injections.  Interval history: no cbg record, but states fasting cbg's vary from 101-130.  pt states he feels well in general.  Pt says he takes the insulin as rx'ed. Pt says he would like to d/c insulin if possible.  Past Medical History:  Diagnosis Date   Anxiety    Benign prostatic hypertrophy    Nocturia   CAD (coronary artery disease)    a.  NSTEMI 10/2012 s/p DES to LAD & DES to 1st diagonal with residual diffuse nonobstructive dz in LCx/RCA    Chronic kidney disease, stage 3, mod decreased GFR (HCC)    Creatinine of 1.5 in 03/2009, STAGE 4 now   Degenerative joint disease    Depression    hx of   Diabetes mellitus, type II ([place]) 12/10/2012.   Diastolic heart failure    Pedal edema, "a long time ago"   Erectile dysfunction    Flu 2013   hx of   GERD (gastroesophageal reflux disease)    Gout    Hemorrhoids    High cholesterol    Hypertension    Exercise induced   Myocardial infarction (HCC)    NSVT (nonsustained ventricular tachycardia) ([place])    Exercise induced   Obesity    Tobacco abuse, 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, quit 1994   Tubular adenoma 2014    Past Surgical History:  Procedure Laterality Date   BACK SURGERY  2007   x2   BIOPSY  02/05/2021   Procedure: BIOPSY;  Surgeon: Daneil Dolin, MD;  [facility];  Service: Endoscopy;;   CARDIAC CATHETERIZATION     with stent placement   COLONOSCOPY WITH ESOPHAGOGASTRODUODENOSCOPY (EGD) N/A 11/23/2013    Dr. Oneida Alar- TCS= left sided colitis and mild proctitis likely due to ischemic colitis, moderate internal hemorrhoids, small nodule in the rectum, bx=tubular adenoma, EGD=-mild non-erosive gastritis   COLONOSCOPY WITH PROPOFOL N/A 07/24/2020   hemorrhoids,  rectosigmoid lipoma, small adenoma removed.   ESOPHAGOGASTRODUODENOSCOPY (EGD) WITH PROPOFOL N/A 02/05/2021   Normal esophagus, erythematous mucosa in entire stomach, no obvious ulcer, s/p biopsy. Normal duodenum. Negative H.pylori.    HERNIA REPAIR     LEFT HEART CATHETERIZATION WITH CORONARY ANGIOGRAM N/A 11/16/2012   Procedure: LEFT HEART CATHETERIZATION WITH CORONARY ANGIOGRAM;  Surgeon: Sherren Mocha, MD;  Location: [facility] CATH LAB;  Service: Cardiovascular;  Laterality: N/A;   PERCUTANEOUS CORONARY STENT INTERVENTION (PCI-S)  11/16/2012   Procedure: PERCUTANEOUS CORONARY STENT INTERVENTION (PCI-S);  Surgeon: Sherren Mocha, MD;  Location: [facility] CATH LAB;  Service: Cardiovascular;;   POLYPECTOMY  07/24/2020   Procedure: POLYPECTOMY;  Surgeon: Daneil Dolin, MD;  [facility]: [facility];  Service: Endoscopy;;   TOTAL KNEE ARTHROPLASTY Right 11/15/2013   Procedure: RIGHT TOTAL KNEE ARTHROPLASTY;  Surgeon: Mauri Pole, MD;  Location: [facility];  Service: Orthopedics;  Laterality: Right;   TOTAL KNEE ARTHROPLASTY Left 03/07/2015   Procedure: LEFT TOTAL KNEE ARTHROPLASTY;  Surgeon: Paralee Cancel, MD;  Location: [facility];  Service: Orthopedics;  Laterality: Left;    Social History   Socioeconomic History   Marital status: Widowed    Spouse name: Not on file   Number of children: Not on file   Years of education: Not on file   Highest education level: Not on file  Occupational History   Occupation: Disabled    Employer: RETIRED  Tobacco Use   Smoking status: Former    Packs/day: 1.00    Years: 30.00    Pack years: 30.00    Types: Cigarettes    Start date: 01/22/1965    Quit date: 12/23/1992    Years since quitting: 28.5   Smokeless tobacco:  Never  Vaping Use   Vaping Use: Never used  Substance and Sexual Activity   Alcohol use: No    Alcohol/week: 0.0 standard drinks   Drug use: No   Sexual activity: Not Currently  Other Topics Concern   Not on file  Social History Narrative   Married   Right handed   One story home no caffeine   Social Determinants of Health   Financial Resource Strain: Not on file  Food Insecurity: Not on file  Transportation Needs: Not on file  Physical Activity: Not on file  Stress: Not on file  Social Connections: Not on file  Intimate Partner Violence: Not on file    Current Outpatient Medications on File Prior to Visit  Medication Sig Dispense Refill   acarbose (PRECOSE) 50 MG tablet Take 50 mg by mouth in the morning, at noon, and at bedtime.      acetaminophen (TYLENOL) 650 MG CR tablet Take 1,300 mg by mouth every 8 (eight) hours as needed for pain.     allopurinol (ZYLOPRIM) 100 MG tablet Take 100-150 mg by mouth 2 (two) times daily. Takes 1.5 tablet in am and 1 tablet in evening     amLODipine (NORVASC) 10 MG tablet Take 10 mg by mouth every evening.      aspirin EC 81 MG tablet Take 1 tablet (81 mg total) by mouth daily. 90 tablet 3   calcitRIOL (ROCALTROL) 0.25 MCG capsule Take 0.25 mcg by mouth every Sunday.      cholecalciferol (VITAMIN D) 1000 units tablet Take 1,000 Units daily by mouth.     cloNIDine (CATAPRES) 0.1 MG tablet Take 0.1 mg by mouth 3 (three) times daily.     dicyclomine (BENTYL) 10 MG capsule Take 10 mg by mouth 3 (three) times daily.     furosemide (LASIX) 40 MG tablet Take 1 tablet (40 mg total) by mouth 2 (two) times daily. 60 tablet 3   gabapentin (NEURONTIN) 300 MG capsule Take 300 mg by mouth 3 (three) times daily.      HYDROcodone-acetaminophen (NORCO) 10-325 MG tablet Take 1-2 tablets by mouth 4 (four) times daily as needed for moderate pain or severe pain. 10 tablet 0   losartan (COZAAR) 25 MG tablet Take 25 mg by mouth daily.     meclizine (ANTIVERT) 25  MG tablet Take 25 mg by mouth 3 (three) times daily as needed for dizziness.     nitroGLYCERIN (NITROSTAT) 0.4 MG SL tablet PLACE ONE TABLET UNDER THE TONGUE EVERY 5 MINUTES FOR 3 DOSES AS NEEDED (Patient taking differently: Place 0.4 mg under the tongue every 5 (five) minutes as needed for chest pain.) 25 tablet 3   ondansetron (ZOFRAN) 4 MG tablet TAKE ONE TABLET (4MG TOTAL) BY MOUTH EVERY 8 HOURS AS NEEDED FOR NAUSEA  OR VOMITING 30 tablet 3   pantoprazole (PROTONIX) 40 MG tablet TAKE ONE TABLET (40MG TOTAL) BY MOUTH TWO TIMES DAILY BEFORE A MEAL 60 tablet 5   pravastatin (PRAVACHOL) 20 MG tablet Take 1 tablet (20 mg total) by mouth every evening. 90 tablet 3   TRUE METRIX BLOOD GLUCOSE TEST test strip USE TO TEST BLOOD SUGAR UP TO TWICE DAILY OR AS DIRECTED 180 each 3   No current facility-administered medications on file prior to visit.    Allergies  Allergen Reactions   Niacin And Related Other (See Comments)    Reaction: flushing and burning side effects even with extended release    Family History  Problem Relation Age of Onset   Hypertension Other    Cancer Other    Heart disease Other    Diabetes Other    Hypertension Mother    Cancer Father        brain   CAD Brother    Colon cancer Neg Hx     BP (!) 156/70 (BP Location: Right Arm, Patient Position: Sitting, Cuff Size: Large)   Pulse 90   Ht 6' 2"  (1.88 m)   Wt 266 lb 12.8 oz (121 kg)   SpO2 97%   BMI 34.26 kg/m    Review of Systems He denies hypoglycemia    Objective:   Physical Exam Pulses: dorsalis pedis absent bilat (poss due to edema) MSK: no deformity of the feet CV: 2+ bilat leg edema.   Skin: no ulcer on the feet, but the skin is thickened and dry.  normal color and temp on the feet.  Old healed surgical scar on the right foot.  Healed ulcers on the legs.   Neuro: sensation is intact to touch on the feet, but decreased from normal.  Ext: there is bilateral onychomycosis of the toenails.     Lab  Results  Component Value Date   HGBA1C 6.6 (A) 06/27/2021      Assessment & Plan:  Insulin-requiring type 2 DM: overcontrolled  Patient Instructions  check your blood sugar twice a day.  vary the time of day when you check, between before the 3 meals, and at bedtime.  also check if you have symptoms of your blood sugar being too high or too low.  please keep a record of the readings and bring it to your next appointment here (or you can bring the meter itself).  You can write it on any piece of paper.  please call us sooner if your blood sugar goes below 70, or if you have a lot of readings over 200.    Please reduce the insulin to 18 units each morning.  Please come back for a follow-up appointment in 3 months.

## 2021-06-29 ENCOUNTER — Ambulatory Visit: Payer: Medicare Other | Admitting: Internal Medicine

## 2021-06-29 ENCOUNTER — Other Ambulatory Visit: Payer: Self-pay

## 2021-06-29 ENCOUNTER — Emergency Department (HOSPITAL_COMMUNITY)
Admission: EM | Admit: 2021-06-29 | Discharge: 2021-06-29 | Disposition: A | Discharge status: Home / Self Care | Payer: Medicare Other | Attending: Emergency Medicine | Admitting: Emergency Medicine

## 2021-06-29 ENCOUNTER — Emergency Department (HOSPITAL_COMMUNITY): Payer: Medicare Other

## 2021-06-29 ENCOUNTER — Encounter (HOSPITAL_COMMUNITY): Payer: Self-pay

## 2021-06-29 ENCOUNTER — Encounter: Payer: Self-pay | Admitting: Internal Medicine

## 2021-06-29 VITALS — BP 131/65 | HR 57 | Temp 97.3°F | Ht 72.0 in | Wt 260.0 lb

## 2021-06-29 DIAGNOSIS — K5900 Constipation, unspecified: Secondary | ICD-10-CM | POA: Diagnosis not present

## 2021-06-29 DIAGNOSIS — N183 Chronic kidney disease, stage 3 unspecified: Secondary | ICD-10-CM | POA: Diagnosis not present

## 2021-06-29 DIAGNOSIS — M79602 Pain in left arm: Secondary | ICD-10-CM

## 2021-06-29 DIAGNOSIS — Z87891 Personal history of nicotine dependence: Secondary | ICD-10-CM | POA: Diagnosis not present

## 2021-06-29 DIAGNOSIS — I2510 Atherosclerotic heart disease of native coronary artery without angina pectoris: Secondary | ICD-10-CM | POA: Insufficient documentation

## 2021-06-29 DIAGNOSIS — E1122 Type 2 diabetes mellitus with diabetic chronic kidney disease: Secondary | ICD-10-CM | POA: Diagnosis not present

## 2021-06-29 DIAGNOSIS — R079 Chest pain, unspecified: Secondary | ICD-10-CM | POA: Diagnosis not present

## 2021-06-29 DIAGNOSIS — I5030 Unspecified diastolic (congestive) heart failure: Secondary | ICD-10-CM | POA: Insufficient documentation

## 2021-06-29 DIAGNOSIS — R200 Anesthesia of skin: Secondary | ICD-10-CM | POA: Diagnosis not present

## 2021-06-29 DIAGNOSIS — I130 Hypertensive heart and chronic kidney disease with heart failure and stage 1 through stage 4 chronic kidney disease, or unspecified chronic kidney disease: Secondary | ICD-10-CM | POA: Insufficient documentation

## 2021-06-29 DIAGNOSIS — K219 Gastro-esophageal reflux disease without esophagitis: Secondary | ICD-10-CM

## 2021-06-29 DIAGNOSIS — R0789 Other chest pain: Secondary | ICD-10-CM | POA: Diagnosis not present

## 2021-06-29 LAB — CBC
HCT: 36 % — ABNORMAL LOW (ref 39.0–52.0)
Hemoglobin: 11.7 g/dL — ABNORMAL LOW (ref 13.0–17.0)
MCH: 32.2 pg (ref 26.0–34.0)
MCHC: 32.5 g/dL (ref 30.0–36.0)
MCV: 99.2 fL (ref 80.0–100.0)
Platelets: 198 10*3/uL (ref 150–400)
RBC: 3.63 MIL/uL — ABNORMAL LOW (ref 4.22–5.81)
RDW: 15 % (ref 11.5–15.5)
WBC: 7.8 10*3/uL (ref 4.0–10.5)
nRBC: 0 % (ref 0.0–0.2)

## 2021-06-29 LAB — BASIC METABOLIC PANEL
Anion gap: 9 (ref 5–15)
BUN: 58 mg/dL — ABNORMAL HIGH (ref 8–23)
CO2: 27 mmol/L (ref 22–32)
Calcium: 9 mg/dL (ref 8.9–10.3)
Chloride: 102 mmol/L (ref 98–111)
Creatinine, Ser: 3.18 mg/dL — ABNORMAL HIGH (ref 0.61–1.24)
GFR, Estimated: 19 mL/min — ABNORMAL LOW (ref >60)
Glucose, Bld: 205 mg/dL — ABNORMAL HIGH (ref 70–99)
Potassium: 3.8 mmol/L (ref 3.5–5.1)
Sodium: 138 mmol/L (ref 135–145)

## 2021-06-29 LAB — TROPONIN I (HIGH SENSITIVITY)
Troponin I (High Sensitivity): 23 ng/L — ABNORMAL HIGH (ref <18)
Troponin I (High Sensitivity): 25 ng/L — ABNORMAL HIGH (ref <18)

## 2021-06-29 MED ORDER — ASPIRIN 81 MG PO CHEW
324.0000 mg | CHEWABLE_TABLET | Freq: Once | ORAL | Status: AC
  Administered 2021-06-29: 324 mg via ORAL
  Filled 2021-06-29: qty 4

## 2021-06-29 MED ORDER — OXYCODONE HCL 5 MG PO TABS
5.0000 mg | ORAL_TABLET | Freq: Once | ORAL | Status: AC
  Administered 2021-06-29: 5 mg via ORAL
  Filled 2021-06-29: qty 1

## 2021-06-29 NOTE — ED Notes (Signed)
Lab in room.

## 2021-06-29 NOTE — ED Notes (Signed)
Arm hurts worse with movement.

## 2021-06-29 NOTE — Discharge Instructions (Addendum)
You were evaluated in the Emergency Department and after careful evaluation, we did not find any emergent condition requiring admission or further testing in the hospital.  Your exam/testing today is overall reassuring.  Recommend close follow-up with your cardiologist within the next 2 weeks for repeat evaluation.Recommend close follow-up with your cardiologist within the next 2 weeks for repeat evaluation.  Please return to the Emergency Department if you experience any worsening of your condition.   Thank you for allowing [place] to be a part of your care.

## 2021-06-29 NOTE — ED Notes (Signed)
Failed IV attempt. Lab in room

## 2021-06-29 NOTE — Patient Instructions (Addendum)
Take Movantik 12.5 mg every day prescribed by Dr. [name].  Take in the morning.  May take a dose of MiraLAX 1 capful in 8 ounces of water at bedtime to help Movantik work better  No Metamucil  No Linzess  Stop dicyclomine or Bentyl   Continue Protonix or pantoprazole 40 mg twice daily  May continue using Gas-X daily as needed.  Office visit with Neil Crouch in 3 months  As discussed, your chronic daily opioids for pain is making your entire GI tract sluggish.  That is why you are constipated and bloating.  We are working against the effects of hydrocodone.

## 2021-06-29 NOTE — ED Notes (Signed)
XR complete

## 2021-06-29 NOTE — Progress Notes (Signed)
Primary Care Physician:  Redmond School, MD Primary Gastroenterologist:  Dr. Gala Romney  Pre-Procedure History & Physical: HPI:  [name] is a 75 y.o. male here for GERD and constipation.  Was on Linzess with some perceived improvement.  In retrospect, patient states Linzess did not help at all.  Dr. [name] started him on low-dose Movantik 12.5 g daily about 3 weeks ago.  This is helped him better than anything else although  - he is still bloated.  May go a day without a bowel movement but not more than 2.  No bleeding.  CTA results as previously chronicled.  No significant mesenteric stenosis.  Reflux continues to be controlled fairly well on Protonix 40 mg twice daily.  No dysphagia.  Continues on chronic opioid therapy every day.  He has lost 10 pounds since his last office visit. He has been taking Gas-X but only sporadically.  Has been taking Bentyl as well.  Past Medical History:  Diagnosis Date   Anxiety    Benign prostatic hypertrophy    Nocturia   CAD (coronary artery disease)    a.  NSTEMI 10/2012 s/p DES to LAD & DES to 1st diagonal with residual diffuse nonobstructive dz in LCx/RCA    Chronic kidney disease, stage 3, mod decreased GFR (HCC)    Creatinine of 1.5 in 03/2009, STAGE 4 now   Degenerative joint disease    Depression    hx of   Diabetes mellitus, type II ([place]) 12/10/2012.   Diastolic heart failure    Pedal edema, "a long time ago"   Erectile dysfunction    Flu 2013   hx of   GERD (gastroesophageal reflux disease)    Gout    Hemorrhoids    High cholesterol    Hypertension    Exercise induced   Myocardial infarction (HCC)    NSVT (nonsustained ventricular tachycardia) ([place])    Exercise induced   Obesity    Tobacco abuse, 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, quit 1994   Tubular adenoma 2014    Past Surgical History:  Procedure Laterality Date   BACK SURGERY  2007   x2   BIOPSY  02/05/2021   Procedure: BIOPSY;  Surgeon: Daneil Dolin, MD;  [facility];  Service: Endoscopy;;   CARDIAC CATHETERIZATION     with stent placement   COLONOSCOPY WITH ESOPHAGOGASTRODUODENOSCOPY (EGD) N/A 11/23/2013   Dr. Oneida Alar- TCS= left sided colitis and mild proctitis likely due to ischemic colitis, moderate internal hemorrhoids, small nodule in the rectum, bx=tubular adenoma, EGD=-mild non-erosive gastritis   COLONOSCOPY WITH PROPOFOL N/A 07/24/2020   hemorrhoids,  rectosigmoid lipoma, small adenoma removed.   ESOPHAGOGASTRODUODENOSCOPY (EGD) WITH PROPOFOL N/A 02/05/2021   Normal esophagus, erythematous mucosa in entire stomach, no obvious ulcer, s/p biopsy. Normal duodenum. Negative H.pylori.    HERNIA REPAIR     LEFT HEART CATHETERIZATION WITH CORONARY ANGIOGRAM N/A 11/16/2012   Procedure: LEFT HEART CATHETERIZATION WITH CORONARY ANGIOGRAM;  Surgeon: Sherren Mocha, MD;  [facility] CATH LAB;  Service: Cardiovascular;  Laterality: N/A;   PERCUTANEOUS CORONARY STENT INTERVENTION (PCI-S)  11/16/2012   Procedure: PERCUTANEOUS CORONARY STENT INTERVENTION (PCI-S);  Surgeon: Sherren Mocha, MD;  Location: [facility] CATH LAB;  Service: Cardiovascular;;   POLYPECTOMY  07/24/2020   Procedure: POLYPECTOMY;  Surgeon: Daneil Dolin, MD;  [facility];  Service: Endoscopy;;   TOTAL KNEE ARTHROPLASTY Right 11/15/2013   Procedure: RIGHT TOTAL KNEE ARTHROPLASTY;  Surgeon: Mauri Pole,  MD;  Location: [facility];  Service: Orthopedics;  Laterality: Right;   TOTAL KNEE ARTHROPLASTY Left 03/07/2015   Procedure: LEFT TOTAL KNEE ARTHROPLASTY;  Surgeon: Paralee Cancel, MD;  Location: [facility];  Service: Orthopedics;  Laterality: Left;    Prior to Admission medications   Medication Sig Start Date End Date Taking? Authorizing Provider  acarbose (PRECOSE) 50 MG tablet Take 50 mg by mouth in the morning, at noon, and at bedtime.  10/20/17  Yes [provider]  acetaminophen (TYLENOL) 650 MG CR tablet Take 1,300 mg by mouth every 8 (eight) hours as needed for pain.    Yes [provider]  allopurinol (ZYLOPRIM) 100 MG tablet Take 100-150 mg by mouth 2 (two) times daily. Takes 1.5 tablet in am and 1 tablet in evening 08/17/20  Yes [provider]  amLODipine (NORVASC) 10 MG tablet Take 10 mg by mouth every evening.    Yes [provider]  aspirin EC 81 MG tablet Take 1 tablet (81 mg total) by mouth daily. 09/14/19  Yes Herminio Commons, MD  calcitRIOL (ROCALTROL) 0.25 MCG capsule Take 0.25 mcg by mouth every Sunday.  12/13/16  Yes [provider]  cholecalciferol (VITAMIN D) 1000 units tablet Take 1,000 Units daily by mouth.   Yes [provider]  cloNIDine (CATAPRES) 0.1 MG tablet Take 0.1 mg by mouth 3 (three) times daily. 02/15/20  Yes [provider]  dicyclomine (BENTYL) 10 MG capsule Take 10 mg by mouth 3 (three) times daily.   Yes [provider]  furosemide (LASIX) 40 MG tablet Take 1 tablet (40 mg total) by mouth 2 (two) times daily. 08/09/19  Yes Herminio Commons, MD  gabapentin (NEURONTIN) 300 MG capsule Take 300 mg by mouth 3 (three) times daily.  10/19/19  Yes [provider]  HYDROcodone-acetaminophen (NORCO) 10-325 MG tablet Take 1-2 tablets by mouth 4 (four) times daily as needed for moderate pain or severe pain. 10/28/19  Yes Shah, Pratik D, DO  insulin NPH Human (NOVOLIN N) 100 UNIT/ML injection Inject 0.18 mLs (18 Units total) into the skin every morning. Ands syringes 1/day 06/27/21  Yes Renato Shin, MD  losartan (COZAAR) 25 MG tablet Take 25 mg by mouth daily.   Yes [provider]  meclizine (ANTIVERT) 25 MG tablet Take 25 mg by mouth 3 (three) times daily as needed for dizziness.   Yes [provider]  naloxegol oxalate (MOVANTIK) 12.5 MG TABS tablet Take 12.5 mg by mouth daily.   Yes [provider]  nitroGLYCERIN (NITROSTAT) 0.4 MG SL tablet PLACE ONE TABLET UNDER THE TONGUE EVERY 5 MINUTES FOR 3 DOSES AS NEEDED Patient taking differently:  Place 0.4 mg under the tongue every 5 (five) minutes as needed for chest pain. 07/22/17  Yes Herminio Commons, MD  ondansetron (ZOFRAN) 4 MG tablet TAKE ONE TABLET (4MG TOTAL) BY MOUTH EVERY 8 HOURS AS NEEDED FOR NAUSEA OR VOMITING 04/24/21  Yes Annitta Needs, NP  pantoprazole (PROTONIX) 40 MG tablet TAKE ONE TABLET (40MG TOTAL) BY MOUTH TWO TIMES DAILY BEFORE A MEAL 05/11/21  Yes Aliene Altes S, PA-C  pravastatin (PRAVACHOL) 20 MG tablet Take 1 tablet (20 mg total) by mouth every evening. 09/14/19 07/12/21 Yes Herminio Commons, MD  TRUE METRIX BLOOD GLUCOSE TEST test strip USE TO TEST BLOOD SUGAR UP TO TWICE DAILY OR AS DIRECTED 08/19/20  Yes Renato Shin, MD    Allergies as of 06/29/2021 - Review Complete 06/29/2021  Allergen Reaction Noted  Niacin and related Other (See Comments) 10/24/2012    Family History  Problem Relation Age of Onset   Hypertension Other    Cancer Other    Heart disease Other    Diabetes Other    Hypertension Mother    Cancer Father        brain   CAD Brother    Colon cancer Neg Hx     Social History   Socioeconomic History   Marital status: Widowed    Spouse name: Not on file   Number of children: Not on file   Years of education: Not on file   Highest education level: Not on file  Occupational History   Occupation: Disabled    Employer: RETIRED  Tobacco Use   Smoking status: Former    Packs/day: 1.00    Years: 30.00    Pack years: 30.00    Types: Cigarettes    Start date: 01/22/1965    Quit date: 12/23/1992    Years since quitting: 28.5   Smokeless tobacco: Never  Vaping Use   Vaping Use: Never used  Substance and Sexual Activity   Alcohol use: No    Alcohol/week: 0.0 standard drinks   Drug use: No   Sexual activity: Not Currently  Other Topics Concern   Not on file  Social History Narrative   Married   Right handed   One story home no caffeine   Social Determinants of Health   Financial Resource Strain: Not on file  Food  Insecurity: Not on file  Transportation Needs: Not on file  Physical Activity: Not on file  Stress: Not on file  Social Connections: Not on file  Intimate Partner Violence: Not on file    Review of Systems: See HPI, otherwise negative ROS  Physical Exam: BP 131/65   Pulse (!) 57   Temp (!) 97.3 F (36.3 C) (Temporal)   Ht 6' (1.829 m)   Wt 260 lb (117.9 kg) Comment: stated by pt  BMI 35.26 kg/m  General:   Alert, pleasant and cooperative in NAD Neck:  Supple; no masses or thyromegaly. No significant cervical adenopathy. Lungs:  Clear throughout to auscultation.   No wheezes, crackles, or rhonchi. No acute distress. Heart:  Regular rate and rhythm; no murmurs, clicks, rubs,  or gallops. Abdomen: Non-distended, normal bowel sounds.  Soft and nontender without appreciable mass or hepatosplenomegaly.  Pulses:  Normal pulses noted. Extremities:  Without clubbing or edema.  Impression/Plan: 75 year old gentleman with multiple comorbidities including stage III chronic kidney disease on chronic opioid therapy with major issues of GERD and chronic constipation  -  likely opioid induced.  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  We discussed this entity at length.  Bentyl likely counter productive at this point in time.   Recommendations:  Take Movantik 12.5 mg every day prescribed by Dr. [name].  Take in the morning.  May take a dose of MiraLAX 1 capful in 8 ounces of water at bedtime to help Movantik work better  No Metamucil  No Linzess  Stop dicyclomine or Bentyl   Continue Protonix or pantoprazole 40 mg twice daily  May continue using Gas-X daily as needed.  Office visit with Neil Crouch in 3 months       Notice: This dictation was prepared with Dragon dictation along with smaller phrase technology. Any transcriptional errors that result from this process are unintentional and may not be corrected upon review.

## 2021-06-29 NOTE — ED Triage Notes (Signed)
Pt reports left arm pain, numbness that started at 4pm yesterday. Concerned about it being from his heart. History of 2 heart stents 7 years ago. Reports gas.

## 2021-06-29 NOTE — ED Provider Notes (Signed)
[facility] Emergency Department Provider Note MRN:  [id number]  Arrival date & time: 06/29/21     Chief Complaint   Arm Pain   History of Present Illness   [name] is a 75 y.o. year-old male with a history of CAD, CKD, diabetes, CHF presenting to the ED with chief complaint of arm pain.  Left arm pain starting gradually at about 4 PM yesterday, constant, possibly worsening.  Currently 7 out of 10, constant, described as an ache.  Worse with movement of the arm.  Thinks maybe he also has some tightness in the chest.  Denies any dizziness or diaphoresis, no nausea vomiting, no trouble breathing.  Leg swelling is unchanged.  Review of Systems  A complete 10 system review of systems was obtained and all systems are negative except as noted in the HPI and PMH.   Patient's Health History    Past Medical History:  Diagnosis Date   Anxiety    Benign prostatic hypertrophy    Nocturia   CAD (coronary artery disease)    a.  NSTEMI 10/2012 s/p DES to LAD & DES to 1st diagonal with residual diffuse nonobstructive dz in LCx/RCA    Chronic kidney disease, stage 3, mod decreased GFR (HCC)    Creatinine of 1.5 in 03/2009, STAGE 4 now   Degenerative joint disease    Depression    hx of   Diabetes mellitus, type II ([place]) 12/10/2012.   Diastolic heart failure    Pedal edema, "a long time ago"   Erectile dysfunction    Flu 2013   hx of   GERD (gastroesophageal reflux disease)    Gout    Hemorrhoids    High cholesterol    Hypertension    Exercise induced   Myocardial infarction (HCC)    NSVT (nonsustained ventricular tachycardia) ([place])    Exercise induced   Obesity    Tobacco abuse, 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, quit 1994   Tubular adenoma 2014    Past Surgical History:  Procedure Laterality Date   BACK SURGERY  2007   x2   BIOPSY  02/05/2021   Procedure: BIOPSY;  Surgeon: Daneil Dolin, MD;  [facility];  Service: Endoscopy;;    CARDIAC CATHETERIZATION     with stent placement   COLONOSCOPY WITH ESOPHAGOGASTRODUODENOSCOPY (EGD) N/A 11/23/2013   Dr. Oneida Alar- TCS= left sided colitis and mild proctitis likely due to ischemic colitis, moderate internal hemorrhoids, small nodule in the rectum, bx=tubular adenoma, EGD=-mild non-erosive gastritis   COLONOSCOPY WITH PROPOFOL N/A 07/24/2020   hemorrhoids,  rectosigmoid lipoma, small adenoma removed.   ESOPHAGOGASTRODUODENOSCOPY (EGD) WITH PROPOFOL N/A 02/05/2021   Normal esophagus, erythematous mucosa in entire stomach, no obvious ulcer, s/p biopsy. Normal duodenum. Negative H.pylori.    HERNIA REPAIR     LEFT HEART CATHETERIZATION WITH CORONARY ANGIOGRAM N/A 11/16/2012   Procedure: LEFT HEART CATHETERIZATION WITH CORONARY ANGIOGRAM;  Surgeon: Sherren Mocha, MD;  Location: [facility] CATH LAB;  Service: Cardiovascular;  Laterality: N/A;   PERCUTANEOUS CORONARY STENT INTERVENTION (PCI-S)  11/16/2012   Procedure: PERCUTANEOUS CORONARY STENT INTERVENTION (PCI-S);  Surgeon: Sherren Mocha, MD;  Location: [facility] CATH LAB;  Service: Cardiovascular;;   POLYPECTOMY  07/24/2020   Procedure: POLYPECTOMY;  Surgeon: Daneil Dolin, MD;  [facility];  Service: Endoscopy;;   TOTAL KNEE ARTHROPLASTY Right 11/15/2013   Procedure: RIGHT TOTAL KNEE ARTHROPLASTY;  Surgeon: Mauri Pole, MD;  [facility]: [facility] ORS;  Service:  Orthopedics;  Laterality: Right;   TOTAL KNEE ARTHROPLASTY Left 03/07/2015   Procedure: LEFT TOTAL KNEE ARTHROPLASTY;  Surgeon: Paralee Cancel, MD;  Location: [facility];  Service: Orthopedics;  Laterality: Left;    Family History  Problem Relation Age of Onset   Hypertension Other    Cancer Other    Heart disease Other    Diabetes Other    Hypertension Mother    Cancer Father        brain   CAD Brother    Colon cancer Neg Hx     Social History   Socioeconomic History   Marital status: Widowed    Spouse name: Not on file   Number of children: Not on file   Years of education:  Not on file   Highest education level: Not on file  Occupational History   Occupation: Disabled    Employer: RETIRED  Tobacco Use   Smoking status: Former    Packs/day: 1.00    Years: 30.00    Pack years: 30.00    Types: Cigarettes    Start date: 01/22/1965    Quit date: 12/23/1992    Years since quitting: 28.5   Smokeless tobacco: Never  Vaping Use   Vaping Use: Never used  Substance and Sexual Activity   Alcohol use: No    Alcohol/week: 0.0 standard drinks   Drug use: No   Sexual activity: Not Currently  Other Topics Concern   Not on file  Social History Narrative   Married   Right handed   One story home no caffeine   Social Determinants of Health   Financial Resource Strain: Not on file  Food Insecurity: Not on file  Transportation Needs: Not on file  Physical Activity: Not on file  Stress: Not on file  Social Connections: Not on file  Intimate Partner Violence: Not on file     Physical Exam   Vitals:   06/29/21 0400 06/29/21 0430  BP: (!) 142/63 (!) 130/58  Pulse:  (!) 44  Resp: 11 11  Temp:    SpO2:  99%    CONSTITUTIONAL: Well-appearing, NAD NEURO:  Alert and oriented x 3, no focal deficits EYES:  eyes equal and reactive ENT/NECK:  no LAD, no JVD CARDIO: Regular rate, well-perfused, normal S1 and S2 PULM:  CTAB no wheezing or rhonchi GI/GU:  normal bowel sounds, non-distended, non-tender MSK/SPINE:  No gross deformities, no edema SKIN:  no rash, atraumatic PSYCH:  Appropriate speech and behavior  *Additional and/or pertinent findings included in MDM below  Diagnostic and Interventional Summary    EKG Interpretation  Date/Time:  Friday June 29 2021 02:01:29 EDT Ventricular Rate:  88 PR Interval:  341 QRS Duration: 104 QT Interval:  415 QTC Calculation: 503 R Axis:   22 Text Interpretation: Sinus rhythm Atrial premature complex Sinus pause Prolonged PR interval Probable left atrial enlargement LVH with secondary repolarization abnormality  Prolonged QT interval Confirmed by Gerlene Fee [phone]) on 06/29/2021 2:04:45 AM        Labs Reviewed  CBC - Abnormal; Notable for the following components:      Result Value   RBC 3.63 (*)    Hemoglobin 11.7 (*)    HCT 36.0 (*)    All other components within normal limits  BASIC METABOLIC PANEL - Abnormal; Notable for the following components:   Glucose, Bld 205 (*)    BUN 58 (*)    Creatinine, Ser 3.18 (*)    GFR, Estimated 19 (*)  All other components within normal limits  TROPONIN I (HIGH SENSITIVITY) - Abnormal; Notable for the following components:   Troponin I (High Sensitivity) 23 (*)    All other components within normal limits  TROPONIN I (HIGH SENSITIVITY) - Abnormal; Notable for the following components:   Troponin I (High Sensitivity) 25 (*)    All other components within normal limits    DG [facility]  Final Result      Medications  aspirin chewable tablet 324 mg (324 mg Oral Given 06/29/21 0219)  oxyCODONE (Oxy IR/ROXICODONE) immediate release tablet 5 mg (5 mg Oral Given 06/29/21 0220)     Procedures  /  Critical Care .1-3 Lead EKG Interpretation  Date/Time: 06/29/2021 2:17 AM Performed by: Maudie Flakes, MD Authorized by: Maudie Flakes, MD     Interpretation: abnormal     ECG rate:  60s   ECG rate assessment: normal     Rhythm: sinus rhythm     Ectopy: PAC   Comments:     Cardiac monitoring was ordered to monitor the patient for dysrhythmia.  I personally interpreted the patient's cardiac monitor while at the bedside.    ED Course and Medical Decision Making  I have reviewed the triage vital signs, the nursing notes, and pertinent available records from the EMR.  Listed above are laboratory and imaging tests that I personally ordered, reviewed, and interpreted and then considered in my medical decision making (see below for details).  Could be due to MSK related pain as patient explains he did exercise today, but given his extensive  history also considering referred cardiac pain.  Will need troponin x2.  EKG is without concerning ischemic features, some ectopy.     Patient continues to look and feel well while here in the emergency department, pain resolved.  Troponin is minimally elevated and flat upon repeat.  Upon further questioning, soreness is more located in the bicep of the left arm.  Was doing bicep curls earlier today.  Pain worse with certain movements or palpation.  Suspect MSK.  Still given the elevated troponin will advise close cardiology follow-up.  Barth Kirks. Sedonia Small, MD [place] [email or web address]  Final Clinical Impressions(s) / ED Diagnoses     ICD-10-CM   1. Left arm pain  M79.602       ED Discharge Orders     None        Discharge Instructions Discussed with and Provided to Patient:     Discharge Instructions      You were evaluated in the Emergency Department and after careful evaluation, we did not find any emergent condition requiring admission or further testing in the hospital.  Your exam/testing today is overall reassuring.  Recommend close follow-up with your cardiologist within the next 2 weeks for repeat evaluation.Recommend close follow-up with your cardiologist within the next 2 weeks for repeat evaluation.  Please return to the Emergency Department if you experience any worsening of your condition.   Thank you for allowing [place] to be a part of your care.          Maudie Flakes, MD 06/29/21 [phone]

## 2021-07-02 DIAGNOSIS — M109 Gout, unspecified: Secondary | ICD-10-CM | POA: Diagnosis not present

## 2021-07-24 NOTE — Progress Notes (Signed)
Cardiology Office Note    Date:  07/31/2021   ID:  [name], DOB 06-May-1946, MRN [id number]   PCP:  [facility], [place]  Cardiologist:  Carlyle Dolly, MD   Advanced Practice Provider:  No care team member to display Electrophysiologist:  None   [phone]   Chief Complaint  Patient presents with   Follow-up    History of Present Illness:  [name] is a 75 y.o. male with history of CAD status post NSTEMI 10/2012 treated with DES to the LAD and diagonal 1 with residual diffuse nonobstructive disease in the RCA and circumflex.  Not on beta-blockers due to bradycardia.  Also has hypertension, HLD, chronic diastolic CHF, CKD followed by Dr. Theador Hawthorne, OSA.  Patient last saw Dr. Harl Bowie 01/19/2021 and was doing well.  Patient in the ED 06/29/2021 with left arm pain felt to be musculoskeletal after doing bicep curls.  He did have minimally elevated troponins that were flat at 23 and 25.   Patient comes in for f/u. Wondering if his stents need checked. He does cardio and weights 45 min 4 days a week. Lifting up to 100 lbs bench press. Denies chest pain, dyspnea, dizziness or presyncope.    Past Medical History:  Diagnosis Date   Anxiety    Benign prostatic hypertrophy    Nocturia   CAD (coronary artery disease)    a.  NSTEMI 10/2012 s/p DES to LAD & DES to 1st diagonal with residual diffuse nonobstructive dz in LCx/RCA    Chronic kidney disease, stage 3, mod decreased GFR (HCC)    Creatinine of 1.5 in 03/2009, STAGE 4 now   Degenerative joint disease    Depression    hx of   Diabetes mellitus, type II ([place]) 12/10/2012.   Diastolic heart failure    Pedal edema, "a long time ago"   Erectile dysfunction    Flu 2013   hx of   GERD (gastroesophageal reflux disease)    Gout    Hemorrhoids    High cholesterol    Hypertension    Exercise induced   Myocardial infarction (HCC)    NSVT (nonsustained ventricular tachycardia) ([place])     Exercise induced   Obesity    Tobacco abuse, 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, quit 1994   Tubular adenoma 2014    Past Surgical History:  Procedure Laterality Date   BACK SURGERY  2007   x2   BIOPSY  02/05/2021   Procedure: BIOPSY;  Surgeon: Daneil Dolin, MD;  [facility];  Service: Endoscopy;;   CARDIAC CATHETERIZATION     with stent placement   COLONOSCOPY WITH ESOPHAGOGASTRODUODENOSCOPY (EGD) N/A 11/23/2013   Dr. Oneida Alar- TCS= left sided colitis and mild proctitis likely due to ischemic colitis, moderate internal hemorrhoids, small nodule in the rectum, bx=tubular adenoma, EGD=-mild non-erosive gastritis   COLONOSCOPY WITH PROPOFOL N/A 07/24/2020   hemorrhoids,  rectosigmoid lipoma, small adenoma removed.   ESOPHAGOGASTRODUODENOSCOPY (EGD) WITH PROPOFOL N/A 02/05/2021   Normal esophagus, erythematous mucosa in entire stomach, no obvious ulcer, s/p biopsy. Normal duodenum. Negative H.pylori.    HERNIA REPAIR     LEFT HEART CATHETERIZATION WITH CORONARY ANGIOGRAM N/A 11/16/2012   Procedure: LEFT HEART CATHETERIZATION WITH CORONARY ANGIOGRAM;  Surgeon: Sherren Mocha, MD;  Location: [facility] CATH LAB;  Service: Cardiovascular;  Laterality: N/A;   PERCUTANEOUS CORONARY STENT INTERVENTION (PCI-S)  11/16/2012   Procedure: PERCUTANEOUS CORONARY STENT INTERVENTION (PCI-S);  Surgeon: Sherren Mocha, MD;  Location: [facility] CATH LAB;  Service: Cardiovascular;;   POLYPECTOMY  07/24/2020   Procedure: POLYPECTOMY;  Surgeon: Daneil Dolin, MD;  [facility];  Service: Endoscopy;;   TOTAL KNEE ARTHROPLASTY Right 11/15/2013   Procedure: RIGHT TOTAL KNEE ARTHROPLASTY;  Surgeon: Mauri Pole, MD;  [facility];  Service: Orthopedics;  Laterality: Right;   TOTAL KNEE ARTHROPLASTY Left 03/07/2015   Procedure: LEFT TOTAL KNEE ARTHROPLASTY;  Surgeon: Paralee Cancel, MD;  Location: [facility];  Service: Orthopedics;  Laterality: Left;    Current Medications: Current Meds  Medication Sig    acarbose (PRECOSE) 50 MG tablet Take 50 mg by mouth in the morning, at noon, and at bedtime.    acetaminophen (TYLENOL) 650 MG CR tablet Take 1,300 mg by mouth every 8 (eight) hours as needed for pain.   allopurinol (ZYLOPRIM) 100 MG tablet Take 100-150 mg by mouth 2 (two) times daily. Takes 1.5 tablet in am and 1 tablet in evening   amLODipine (NORVASC) 10 MG tablet Take 10 mg by mouth every evening.    aspirin EC 81 MG tablet Take 1 tablet (81 mg total) by mouth daily.   calcitRIOL (ROCALTROL) 0.25 MCG capsule Take 0.25 mcg by mouth every Sunday.    cholecalciferol (VITAMIN D) 1000 units tablet Take 1,000 Units daily by mouth.   cloNIDine (CATAPRES) 0.1 MG tablet Take 0.1 mg by mouth 3 (three) times daily.   dicyclomine (BENTYL) 10 MG capsule Take 10 mg by mouth 3 (three) times daily.   furosemide (LASIX) 40 MG tablet Take 1 tablet (40 mg total) by mouth 2 (two) times daily.   gabapentin (NEURONTIN) 300 MG capsule Take 300 mg by mouth 3 (three) times daily.    HYDROcodone-acetaminophen (NORCO) 10-325 MG tablet Take 1-2 tablets by mouth 4 (four) times daily as needed for moderate pain or severe pain.   insulin NPH Human (NOVOLIN N) 100 UNIT/ML injection Inject 0.18 mLs (18 Units total) into the skin every morning. Ands syringes 1/day   losartan (COZAAR) 25 MG tablet Take 25 mg by mouth daily.   meclizine (ANTIVERT) 25 MG tablet Take 25 mg by mouth 3 (three) times daily as needed for dizziness.   naloxegol oxalate (MOVANTIK) 12.5 MG TABS tablet Take 12.5 mg by mouth daily.   nitroGLYCERIN (NITROSTAT) 0.4 MG SL tablet PLACE ONE TABLET UNDER THE TONGUE EVERY 5 MINUTES FOR 3 DOSES AS NEEDED (Patient taking differently: Place 0.4 mg under the tongue every 5 (five) minutes as needed for chest pain.)   ondansetron (ZOFRAN) 4 MG tablet TAKE ONE TABLET (4MG TOTAL) BY MOUTH EVERY 8 HOURS AS NEEDED FOR NAUSEA OR VOMITING   pantoprazole (PROTONIX) 40 MG tablet TAKE ONE TABLET (40MG TOTAL) BY MOUTH TWO TIMES  DAILY BEFORE A MEAL   TRUE METRIX BLOOD GLUCOSE TEST test strip USE TO TEST BLOOD SUGAR UP TO TWICE DAILY OR AS DIRECTED   [DISCONTINUED] pravastatin (PRAVACHOL) 40 MG tablet Take 1 tablet (40 mg total) by mouth every evening.     Allergies:   Niacin and related   Social History   Socioeconomic History   Marital status: Widowed    Spouse name: Not on file   Number of children: Not on file   Years of education: Not on file   Highest education level: Not on file  Occupational History   Occupation: Disabled    Employer: RETIRED  Tobacco Use   Smoking status: Former    Packs/day: 1.00  Years: 30.00    Pack years: 30.00    Types: Cigarettes    Start date: 01/22/1965    Quit date: 12/23/1992    Years since quitting: 28.6   Smokeless tobacco: Never  Vaping Use   Vaping Use: Never used  Substance and Sexual Activity   Alcohol use: No    Alcohol/week: 0.0 standard drinks   Drug use: No   Sexual activity: Not Currently  Other Topics Concern   Not on file  Social History Narrative   Married   Right handed   One story home no caffeine   Social Determinants of Health   Financial Resource Strain: Not on file  Food Insecurity: Not on file  Transportation Needs: Not on file  Physical Activity: Not on file  Stress: Not on file  Social Connections: Not on file     Family History:  The patient's  family history includes CAD in his brother; Cancer in his father and another family member; Diabetes in an other family member; Heart disease in an other family member; Hypertension in his mother and another family member.   ROS:   Please see the history of present illness.    ROS All other systems reviewed and are negative.   PHYSICAL EXAM:   VS:  BP 136/64   Pulse 84   Ht 6' 2"  (1.88 m)   Wt 270 lb (122.5 kg)   SpO2 96%   BMI 34.67 kg/m   Physical Exam  GEN: Well nourished, well developed, in no acute distress  Neck: no JVD, carotid bruits, or masses Cardiac:RRR; skipping,  2/6 systolic murmur Respiratory:  clear to auscultation bilaterally, normal work of breathing GI: soft, nontender, nondistended, + BS Ext: plus 1-2 edema, without cyanosis, clubbing,  Good distal pulses bilaterally Neuro:  Alert and Oriented x 3, Psych: euthymic mood, full affect  Wt Readings from Last 3 Encounters:  07/31/21 270 lb (122.5 kg)  06/29/21 260 lb (117.9 kg)  06/29/21 260 lb (117.9 kg)      Studies/Labs Reviewed:   EKG:  EKG is not ordered today.  The ekg  reviewed from ED 06/29/21 unchanged  Recent Labs: 05/22/2021: ALT 12 06/29/2021: BUN 58; Creatinine, Ser 3.18; Hemoglobin 11.7; Platelets 198; Potassium 3.8; Sodium 138   Lipid Panel    Component Value Date/Time   CHOL 121 11/15/2012 0625   TRIG 57 11/15/2012 0625   HDL 36 (L) 11/15/2012 0625   CHOLHDL 3.4 11/15/2012 0625   VLDL 11 11/15/2012 0625   LDLCALC 74 11/15/2012 0625    Additional studies/ records that were reviewed today include:  03/2020 echo IMPRESSIONS     1. Left ventricular ejection fraction, by estimation, is 55 to 60%. The  left ventricle has normal function. The left ventricle has no regional  wall motion abnormalities. There is mild left ventricular hypertrophy.  Left ventricular diastolic parameters  are consistent with Grade I diastolic dysfunction (impaired relaxation).   2. Right ventricular systolic function is normal. The right ventricular  size is normal.   3. Left atrial size was mildly dilated.   4. The mitral valve is normal in structure. No evidence of mitral valve  regurgitation. No evidence of mitral stenosis.   5. The aortic valve is tricuspid. Aortic valve regurgitation is not  visualized. No aortic stenosis is present.     Jan 2022 CTA IMPRESSION: Mesenteric arterial disease is present, however, the CT does not support chronic mesenteric ischemia as a source of abdominal pain given all  3 mesenteric arteries are patent, and the greatest degree of narrowing is  estimated 50% of the proximal SMA.   Bilateral renal arterial disease, worst on the right where there appears to be high-grade stenosis and associated asymmetric right renal parenchyma.   Aortic Atherosclerosis (ICD10-I70.0).   Mild bilateral iliac arterial disease without high-grade stenosis or occlusion.       Risk Assessment/Calculations:         ASSESSMENT:    1. CAD S/P percutaneous coronary angioplasty   2. Essential hypertension   3. Hyperlipidemia, unspecified hyperlipidemia type   4. Diastolic CHF, chronic (HCC)   5. Stage 3 chronic kidney disease, unspecified whether stage 3a or 3b CKD ([place])   6. Obstructive sleep apnea      PLAN:  In order of problems listed above:  CAD status post NSTEMI 11/16/2012 treated with DES to the LAD and diagonal 1 with residual diffuse nonobstructive disease in the RCA and circumflex, no beta-blocker because of bradycardia.  In ED 06/29/2021 with left arm pain after doing bicep curls.  Minimally elevated troponins but were flat. No further pain, exercising regularly.  Hypertension BP controlled  Hyperlipidemia -LDL 93 12/2020-increase pravachol 40 mg daily, repeat FLP 3 months  Chronic diastolic CHF-chronic LEE managed with lasix  CKD followed by Dr. Theador Hawthorne    Shared Decision Making/Informed Consent        Medication Adjustments/Labs and Tests Ordered: Current medicines are reviewed at length with the patient today.  Concerns regarding medicines are outlined above.  Medication changes, Labs and Tests ordered today are listed in the Patient Instructions below. Patient Instructions  Medication Instructions:   Increase Pravastatin to 40 mg Daily   *If you need a refill on your cardiac medications before your next appointment, please call your pharmacy*   Lab Work: Your physician recommends that you return for lab work in: 3 Months ( 10/31/21)   If you have labs (blood work) drawn today and your tests are completely normal,  you will receive your results only by: [place] (if you have [place]) OR A paper copy in the mail If you have any lab test that is abnormal or we need to change your treatment, we will call you to review the results.   Testing/Procedures: NONE    Follow-Up: At [facility], you and your health needs are our priority.  As part of our continuing mission to provide you with exceptional heart care, we have created designated Provider Care Teams.  These Care Teams include your primary Cardiologist (physician) and Advanced Practice Providers (APPs -  Physician Assistants and Nurse Practitioners) who all work together to provide you with the care you need, when you need it.  We recommend signing up for the patient portal called "MyChart".  Sign up information is provided on this After Visit Summary.  MyChart is used to connect with patients for Virtual Visits (Telemedicine).  Patients are able to view lab/test results, encounter notes, upcoming appointments, etc.  Non-urgent messages can be sent to your provider as well.   To learn more about what you can do with MyChart, go to [email or web address].    Your next appointment:   6 month(s)  The format for your next appointment:   In Person  Provider:   Carlyle Dolly, MD   Other Instructions Thank you for choosing [facility]!     [name] Boast, PA-C  07/31/2021 11:31 AM    [facility] [place],  [place]  [address] Phone: [phone]; Fax: [phone]

## 2021-07-26 DIAGNOSIS — G894 Chronic pain syndrome: Secondary | ICD-10-CM | POA: Diagnosis not present

## 2021-07-26 DIAGNOSIS — E1140 Type 2 diabetes mellitus with diabetic neuropathy, unspecified: Secondary | ICD-10-CM | POA: Diagnosis not present

## 2021-07-26 DIAGNOSIS — M5030 Other cervical disc degeneration, unspecified cervical region: Secondary | ICD-10-CM | POA: Diagnosis not present

## 2021-07-27 ENCOUNTER — Ambulatory Visit: Payer: Medicare Other | Admitting: Family Medicine

## 2021-07-30 DIAGNOSIS — M179 Osteoarthritis of knee, unspecified: Secondary | ICD-10-CM | POA: Diagnosis not present

## 2021-07-31 ENCOUNTER — Other Ambulatory Visit: Payer: Self-pay

## 2021-07-31 ENCOUNTER — Ambulatory Visit: Payer: Medicare Other | Admitting: Physician Assistant

## 2021-07-31 VITALS — BP 136/64 | HR 84 | Ht 74.0 in | Wt 270.0 lb

## 2021-07-31 DIAGNOSIS — N183 Chronic kidney disease, stage 3 unspecified: Secondary | ICD-10-CM | POA: Diagnosis not present

## 2021-07-31 DIAGNOSIS — Z9861 Coronary angioplasty status: Secondary | ICD-10-CM | POA: Diagnosis not present

## 2021-07-31 DIAGNOSIS — I2510 Atherosclerotic heart disease of native coronary artery without angina pectoris: Secondary | ICD-10-CM | POA: Diagnosis not present

## 2021-07-31 DIAGNOSIS — G4733 Obstructive sleep apnea (adult) (pediatric): Secondary | ICD-10-CM | POA: Diagnosis not present

## 2021-07-31 DIAGNOSIS — I10 Essential (primary) hypertension: Secondary | ICD-10-CM | POA: Diagnosis not present

## 2021-07-31 DIAGNOSIS — E785 Hyperlipidemia, unspecified: Secondary | ICD-10-CM | POA: Diagnosis not present

## 2021-07-31 DIAGNOSIS — I5032 Chronic diastolic (congestive) heart failure: Secondary | ICD-10-CM | POA: Diagnosis not present

## 2021-07-31 MED ORDER — PRAVASTATIN SODIUM 40 MG PO TABS: 40.0000 mg | ORAL_TABLET | Freq: Every evening | ORAL | 3 refills | Status: DC

## 2021-07-31 NOTE — Patient Instructions (Signed)
Medication Instructions:   Increase Pravastatin to 40 mg Daily   *If you need a refill on your cardiac medications before your next appointment, please call your pharmacy*   Lab Work: Your physician recommends that you return for lab work in: 3 Months ( 10/31/21)   If you have labs (blood work) drawn today and your tests are completely normal, you will receive your results only by: [place] (if you have [place]) OR A paper copy in the mail If you have any lab test that is abnormal or we need to change your treatment, we will call you to review the results.   Testing/Procedures: NONE    Follow-Up: At [facility], you and your health needs are our priority.  As part of our continuing mission to provide you with exceptional heart care, we have created designated Provider Care Teams.  These Care Teams include your primary Cardiologist (physician) and Advanced Practice Providers (APPs -  Physician Assistants and Nurse Practitioners) who all work together to provide you with the care you need, when you need it.  We recommend signing up for the patient portal called "MyChart".  Sign up information is provided on this After Visit Summary.  MyChart is used to connect with patients for Virtual Visits (Telemedicine).  Patients are able to view lab/test results, encounter notes, upcoming appointments, etc.  Non-urgent messages can be sent to your provider as well.   To learn more about what you can do with MyChart, go to [email or web address].    Your next appointment:   6 month(s)  The format for your next appointment:   In Person  Provider:   Carlyle Dolly, MD   Other Instructions Thank you for choosing [facility]!

## 2021-08-01 DIAGNOSIS — R110 Nausea: Secondary | ICD-10-CM | POA: Diagnosis not present

## 2021-08-01 DIAGNOSIS — K5909 Other constipation: Secondary | ICD-10-CM | POA: Diagnosis not present

## 2021-08-01 DIAGNOSIS — I499 Cardiac arrhythmia, unspecified: Secondary | ICD-10-CM | POA: Diagnosis not present

## 2021-08-06 ENCOUNTER — Telehealth: Payer: Self-pay | Admitting: Internal Medicine

## 2021-08-06 NOTE — Telephone Encounter (Signed)
Please call patient, he can not go to the bathroom and his appointment is not until October 31

## 2021-08-06 NOTE — Telephone Encounter (Signed)
Lmom for pt to return my call.  

## 2021-08-07 ENCOUNTER — Telehealth: Payer: Self-pay | Admitting: Internal Medicine

## 2021-08-07 ENCOUNTER — Other Ambulatory Visit: Payer: Self-pay | Admitting: Internal Medicine

## 2021-08-07 NOTE — Telephone Encounter (Signed)
Pt called stating that he has not had a bm since last Monday. Pt states that Rowan Blase PA-C prescribed him lubiprostone 8 mg twice daily last Monday and it has not helped. Pt states that he drank a cup full of miralax last night as well. Pt has not had any nausea, vomiting, diarrhea, fever or rectal bleeding.

## 2021-08-07 NOTE — Telephone Encounter (Signed)
Returned pt's call. See other note.

## 2021-08-07 NOTE — Telephone Encounter (Signed)
Spoke with pt he is ok with trying the Golytely, however according to Epic his insurance does not cover it. His insurance will cover clenpiq. Can that work for the pt as well?

## 2021-08-07 NOTE — Telephone Encounter (Signed)
Lmom for pt to return my call.  

## 2021-08-07 NOTE — Telephone Encounter (Signed)
Pt called back asking for nurse. I told him she was not available at the moment. He said he needed something done because he hasn't had a BM since Monday. [phone]

## 2021-08-08 ENCOUNTER — Telehealth: Payer: Self-pay | Admitting: Internal Medicine

## 2021-08-08 ENCOUNTER — Other Ambulatory Visit: Payer: Self-pay | Admitting: Internal Medicine

## 2021-08-08 DIAGNOSIS — M1A9XX1 Chronic gout, unspecified, 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ophi): Secondary | ICD-10-CM | POA: Diagnosis not present

## 2021-08-08 MED ORDER — CLENPIQ 10-3.5-12 MG-GM -GM/160ML PO SOLN: 1.0000 | ORAL | 0 refills | Status: DC

## 2021-08-08 NOTE — Telephone Encounter (Signed)
Pt was notified. Rx was sent in to pharmacy.

## 2021-08-08 NOTE — Telephone Encounter (Signed)
Pt needs the nurse to call him. [phone]

## 2021-08-08 NOTE — Telephone Encounter (Signed)
Returned pt's call, informed him to pick medication up from pharmacy.

## 2021-08-20 DIAGNOSIS — R42 Dizziness and giddiness: Secondary | ICD-10-CM | POA: Diagnosis not present

## 2021-08-21 DIAGNOSIS — E1140 Type 2 diabetes mellitus with diabetic neuropathy, unspecified: Secondary | ICD-10-CM | POA: Diagnosis not present

## 2021-08-21 DIAGNOSIS — I872 Venous insufficiency (chronic) (peripheral): Secondary | ICD-10-CM | POA: Diagnosis not present

## 2021-08-21 DIAGNOSIS — M48061 Spinal stenosis, lumbar region without neurogenic claudication: Secondary | ICD-10-CM | POA: Diagnosis not present

## 2021-08-21 DIAGNOSIS — R600 Localized edema: Secondary | ICD-10-CM | POA: Diagnosis not present

## 2021-08-21 DIAGNOSIS — N184 Chronic kidney disease, stage 4 (severe): Secondary | ICD-10-CM | POA: Diagnosis not present

## 2021-08-21 DIAGNOSIS — E1129 Type 2 diabetes mellitus with other diabetic kidney complication: Secondary | ICD-10-CM | POA: Diagnosis not present

## 2021-08-23 DIAGNOSIS — I129 Hypertensive chronic kidney disease with stage 1 through stage 4 chronic kidney disease, or unspecified chronic kidney disease: Secondary | ICD-10-CM | POA: Diagnosis not present

## 2021-08-23 DIAGNOSIS — E1122 Type 2 diabetes mellitus with diabetic chronic kidney disease: Secondary | ICD-10-CM | POA: Diagnosis not present

## 2021-08-23 DIAGNOSIS — R809 Proteinuria, unspecified: Secondary | ICD-10-CM | POA: Diagnosis not present

## 2021-08-23 DIAGNOSIS — N189 Chronic kidney disease, unspecified: Secondary | ICD-10-CM | POA: Diagnosis not present

## 2021-08-23 DIAGNOSIS — E1129 Type 2 diabetes mellitus with other diabetic kidney complication: Secondary | ICD-10-CM | POA: Diagnosis not present

## 2021-08-28 ENCOUNTER — Other Ambulatory Visit (HOSPITAL_COMMUNITY): Payer: Self-pay | Admitting: Neurosurgery

## 2021-08-28 ENCOUNTER — Other Ambulatory Visit: Payer: Self-pay | Admitting: Neurosurgery

## 2021-08-28 DIAGNOSIS — G8929 Other chronic pain: Secondary | ICD-10-CM | POA: Insufficient documentation

## 2021-08-28 DIAGNOSIS — G894 Chronic pain syndrome: Secondary | ICD-10-CM | POA: Diagnosis not present

## 2021-08-28 DIAGNOSIS — M48061 Spinal stenosis, lumbar region without neurogenic claudication: Secondary | ICD-10-CM | POA: Diagnosis not present

## 2021-08-28 DIAGNOSIS — M5441 Lumbago with sciatica, right side: Secondary | ICD-10-CM | POA: Diagnosis not present

## 2021-08-28 DIAGNOSIS — E1129 Type 2 diabetes mellitus with other diabetic kidney complication: Secondary | ICD-10-CM | POA: Diagnosis not present

## 2021-08-28 DIAGNOSIS — R600 Localized edema: Secondary | ICD-10-CM | POA: Diagnosis not present

## 2021-08-28 DIAGNOSIS — M5442 Lumbago with sciatica, left side: Secondary | ICD-10-CM | POA: Diagnosis not present

## 2021-08-28 DIAGNOSIS — I872 Venous insufficiency (chronic) (peripheral): Secondary | ICD-10-CM | POA: Diagnosis not present

## 2021-08-28 DIAGNOSIS — R29898 Other symptoms and signs involving the musculoskeletal system: Secondary | ICD-10-CM | POA: Insufficient documentation

## 2021-08-28 DIAGNOSIS — Z6833 Body mass index (BMI) 33.0-33.9, adult: Secondary | ICD-10-CM | POA: Insufficient documentation

## 2021-08-29 DIAGNOSIS — N189 Chronic kidney disease, unspecified: Secondary | ICD-10-CM | POA: Diagnosis not present

## 2021-08-29 DIAGNOSIS — E1122 Type 2 diabetes mellitus with diabetic chronic kidney disease: Secondary | ICD-10-CM | POA: Diagnosis not present

## 2021-08-29 DIAGNOSIS — I5032 Chronic diastolic (congestive) heart failure: Secondary | ICD-10-CM | POA: Diagnosis not present

## 2021-08-29 DIAGNOSIS — R809 Proteinuria, unspecified: Secondary | ICD-10-CM | POA: Diagnosis not present

## 2021-08-29 DIAGNOSIS — E1129 Type 2 diabetes mellitus with other diabetic kidney complication: Secondary | ICD-10-CM | POA: Diagnosis not present

## 2021-08-29 DIAGNOSIS — E211 Secondary hyperparathyroidism, not elsewhere classified: Secondary | ICD-10-CM | POA: Diagnosis not present

## 2021-08-29 DIAGNOSIS — I129 Hypertensive chronic kidney disease with stage 1 through stage 4 chronic kidney disease, or unspecified chronic kidney disease: Secondary | ICD-10-CM | POA: Diagnosis not present

## 2021-08-29 DIAGNOSIS — D638 Anemia in other chronic diseases classified elsewhere: Secondary | ICD-10-CM | POA: Diagnosis not present

## 2021-08-31 ENCOUNTER — Other Ambulatory Visit: Payer: Self-pay | Admitting: Endocrinology

## 2021-09-06 ENCOUNTER — Ambulatory Visit (HOSPITAL_COMMUNITY)
Admission: RE | Admit: 2021-09-06 | Discharge: 2021-09-06 | Disposition: A | Discharge status: Home / Self Care | Payer: Medicare Other | Source: Ambulatory Visit | Attending: Neurosurgery | Admitting: Neurosurgery

## 2021-09-06 ENCOUNTER — Other Ambulatory Visit: Payer: Self-pay

## 2021-09-06 DIAGNOSIS — M5441 Lumbago with sciatica, right side: Secondary | ICD-10-CM | POA: Insufficient documentation

## 2021-09-06 DIAGNOSIS — M5442 Lumbago with sciatica, left side: Secondary | ICD-10-CM | POA: Diagnosis not present

## 2021-09-06 DIAGNOSIS — G8929 Other chronic pain: Secondary | ICD-10-CM | POA: Insufficient documentation

## 2021-09-06 DIAGNOSIS — M48061 Spinal stenosis, lumbar region without neurogenic claudication: Secondary | ICD-10-CM | POA: Diagnosis not present

## 2021-09-18 DIAGNOSIS — M5442 Lumbago with sciatica, left side: Secondary | ICD-10-CM | POA: Diagnosis not present

## 2021-09-20 DIAGNOSIS — R2689 Other abnormalities of gait and mobility: Secondary | ICD-10-CM | POA: Diagnosis not present

## 2021-09-20 DIAGNOSIS — M5430 Sciatica, unspecified side: Secondary | ICD-10-CM | POA: Diagnosis not present

## 2021-09-20 DIAGNOSIS — H8190 Unspecified disorder of vestibular function, unspecified ear: Secondary | ICD-10-CM | POA: Diagnosis not present

## 2021-09-20 DIAGNOSIS — R531 Weakness: Secondary | ICD-10-CM | POA: Diagnosis not present

## 2021-09-20 DIAGNOSIS — R293 Abnormal posture: Secondary | ICD-10-CM | POA: Diagnosis not present

## 2021-09-20 DIAGNOSIS — M5440 Lumbago with sciatica, unspecified side: Secondary | ICD-10-CM | POA: Diagnosis not present

## 2021-09-25 ENCOUNTER — Ambulatory Visit: Payer: Medicare Other | Admitting: Gastroenterology

## 2021-09-27 ENCOUNTER — Ambulatory Visit: Payer: Medicare Other | Admitting: Endocrinology

## 2021-09-27 DIAGNOSIS — G894 Chronic pain syndrome: Secondary | ICD-10-CM | POA: Diagnosis not present

## 2021-09-27 DIAGNOSIS — M5136 Other intervertebral disc degeneration, lumbar region: Secondary | ICD-10-CM | POA: Diagnosis not present

## 2021-09-27 DIAGNOSIS — K219 Gastro-esophageal reflux disease without esophagitis: Secondary | ICD-10-CM | POA: Diagnosis not present

## 2021-09-27 DIAGNOSIS — M159 Polyosteoarthritis, unspecified: Secondary | ICD-10-CM | POA: Diagnosis not present

## 2021-09-27 DIAGNOSIS — M179 Osteoarthritis of knee, unspecified: Secondary | ICD-10-CM | POA: Diagnosis not present

## 2021-09-27 DIAGNOSIS — M5030 Other cervical disc degeneration, unspecified cervical region: Secondary | ICD-10-CM | POA: Diagnosis not present

## 2021-09-27 DIAGNOSIS — M1991 Primary osteoarthritis, unspecified site: Secondary | ICD-10-CM | POA: Diagnosis not present

## 2021-10-02 DIAGNOSIS — R293 Abnormal posture: Secondary | ICD-10-CM | POA: Diagnosis not present

## 2021-10-02 DIAGNOSIS — M5430 Sciatica, unspecified side: Secondary | ICD-10-CM | POA: Diagnosis not present

## 2021-10-02 DIAGNOSIS — M5440 Lumbago with sciatica, unspecified side: Secondary | ICD-10-CM | POA: Diagnosis not present

## 2021-10-02 DIAGNOSIS — R2689 Other abnormalities of gait and mobility: Secondary | ICD-10-CM | POA: Diagnosis not present

## 2021-10-02 DIAGNOSIS — R531 Weakness: Secondary | ICD-10-CM | POA: Diagnosis not present

## 2021-10-02 DIAGNOSIS — H8190 Unspecified disorder of vestibular function, unspecified ear: Secondary | ICD-10-CM | POA: Diagnosis not present

## 2021-10-10 DIAGNOSIS — G894 Chronic pain syndrome: Secondary | ICD-10-CM | POA: Diagnosis not present

## 2021-10-10 DIAGNOSIS — E1129 Type 2 diabetes mellitus with other diabetic kidney complication: Secondary | ICD-10-CM | POA: Diagnosis not present

## 2021-10-10 DIAGNOSIS — N184 Chronic kidney disease, stage 4 (severe): Secondary | ICD-10-CM | POA: Diagnosis not present

## 2021-10-10 DIAGNOSIS — M179 Osteoarthritis of knee, unspecified: Secondary | ICD-10-CM | POA: Diagnosis not present

## 2021-10-10 DIAGNOSIS — M48061 Spinal stenosis, lumbar region without neurogenic claudication: Secondary | ICD-10-CM | POA: Diagnosis not present

## 2021-10-10 DIAGNOSIS — I10 Essential (primary) hypertension: Secondary | ICD-10-CM | POA: Diagnosis not present

## 2021-10-21 ENCOUNTER — Emergency Department (HOSPITAL_COMMUNITY)
Admission: EM | Admit: 2021-10-21 | Discharge: 2021-10-21 | Disposition: A | Discharge status: Home / Self Care | Payer: Medicare Other | Attending: Student | Admitting: Student

## 2021-10-21 ENCOUNTER — Encounter (HOSPITAL_COMMUNITY): Payer: Self-pay | Admitting: Emergency Medicine

## 2021-10-21 ENCOUNTER — Other Ambulatory Visit: Payer: Self-pay

## 2021-10-21 DIAGNOSIS — E1122 Type 2 diabetes mellitus with diabetic chronic kidney disease: Secondary | ICD-10-CM | POA: Diagnosis not present

## 2021-10-21 DIAGNOSIS — R209 Unspecified disturbances of skin sensation: Secondary | ICD-10-CM | POA: Insufficient documentation

## 2021-10-21 DIAGNOSIS — Z7982 Long term (current) use of aspirin: Secondary | ICD-10-CM | POA: Insufficient documentation

## 2021-10-21 DIAGNOSIS — I2510 Atherosclerotic heart disease of native coronary artery without angina pectoris: Secondary | ICD-10-CM | POA: Insufficient documentation

## 2021-10-21 DIAGNOSIS — Z79899 Other long term (current) drug therapy: Secondary | ICD-10-CM | POA: Insufficient documentation

## 2021-10-21 DIAGNOSIS — Z96653 Presence of artificial knee joint, bilateral: Secondary | ICD-10-CM | POA: Diagnosis not present

## 2021-10-21 DIAGNOSIS — Z87891 Personal history of nicotine dependence: Secondary | ICD-10-CM | POA: Insufficient documentation

## 2021-10-21 DIAGNOSIS — I5033 Acute on chronic diastolic (congestive) heart failure: Secondary | ICD-10-CM | POA: Diagnosis not present

## 2021-10-21 DIAGNOSIS — I130 Hypertensive heart and chronic kidney disease with heart failure and stage 1 through stage 4 chronic kidney disease, or unspecified chronic kidney disease: Secondary | ICD-10-CM | POA: Insufficient documentation

## 2021-10-21 DIAGNOSIS — N183 Chronic kidney disease, stage 3 unspecified: Secondary | ICD-10-CM | POA: Diagnosis not present

## 2021-10-21 DIAGNOSIS — Z794 Long term (current) use of insulin: Secondary | ICD-10-CM | POA: Insufficient documentation

## 2021-10-21 DIAGNOSIS — M5442 Lumbago with sciatica, left side: Secondary | ICD-10-CM | POA: Diagnosis not present

## 2021-10-21 MED ORDER — LIDOCAINE 5 % EX PTCH
1.0000 | MEDICATED_PATCH | CUTANEOUS | Status: DC
  Administered 2021-10-21: 1 via TRANSDERMAL
  Filled 2021-10-21: qty 1

## 2021-10-21 MED ORDER — ONDANSETRON 4 MG PO TBDP
4.0000 mg | ORAL_TABLET | Freq: Once | ORAL | Status: AC
  Administered 2021-10-21: 4 mg via ORAL
  Filled 2021-10-21: qty 1

## 2021-10-21 MED ORDER — MORPHINE SULFATE (PF) 4 MG/ML IV SOLN
4.0000 mg | Freq: Once | INTRAVENOUS | Status: AC
  Administered 2021-10-21: 4 mg via INTRAMUSCULAR
  Filled 2021-10-21: qty 1

## 2021-10-21 NOTE — ED Triage Notes (Signed)
Pt has chronic back pain and is due for surgery in December per the pt. Pt states in the last two weeks his pain has increased running down both legs.

## 2021-10-21 NOTE — ED Notes (Signed)
Pt educated on effects of morphine and driving. Pt refuses to obtain ride and said "I'm driving myself I live 5 minutes away." EDP aware and spoke with pt. Pt is ambulatory and shows no signs of impairment.

## 2021-10-21 NOTE — ED Provider Notes (Signed)
[facility] EMERGENCY DEPARTMENT Provider Note   CSN: [id number] Arrival date & time: 10/21/21  4174     History Chief Complaint  Patient presents with   Back Pain    DESMON HITCHNER is a 75 y.o. male with PMH CAD status post MI with DES 3, T2DM, CHF, known lumbar foraminal stenosis with scheduled surgery in December 2022 who presents the emergency department for evaluation of back pain.  He states that over the last 2 weeks he has had gradually worsening lower back pain with numbness greater on the left than right.  There are multiple previous notes from outpatient providers documenting the patient's history of bilateral lower extremity numbness and low back pain.  He states that his neurosurgeons told him to come to the emergency department if his pain worsened.  He was recently placed on an opioid pain medication that has not been controlling his pain.  He denies saddle anesthesia, urinary retention or weakness of lower extremities.  Denies chest pain, shortness of breath, abdominal pain, nausea, vomiting or other systemic or neurologic complaints.   Back Pain Associated symptoms: numbness   Associated symptoms: no abdominal pain, no chest pain, no dysuria and no fever       Past Medical History:  Diagnosis Date   Anxiety    Benign prostatic hypertrophy    Nocturia   CAD (coronary artery disease)    a.  NSTEMI 10/2012 s/p DES to LAD & DES to 1st diagonal with residual diffuse nonobstructive dz in LCx/RCA    Chronic kidney disease, stage 3, mod decreased GFR (HCC)    Creatinine of 1.5 in 03/2009, STAGE 4 now   Degenerative joint disease    Depression    hx of   Diabetes mellitus, type II ([place]) 12/10/2012.   Diastolic heart failure    Pedal edema, "a long time ago"   Erectile dysfunction    Flu 2013   hx of   GERD (gastroesophageal reflux disease)    Gout    Hemorrhoids    High cholesterol    Hypertension    Exercise induced   Myocardial infarction (HCC)    NSVT  (nonsustained ventricular tachycardia)    Exercise induced   Obesity    Tobacco abuse, 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, quit 1994   Tubular adenoma 2014    Patient Active Problem List   Diagnosis Date Noted   Constipation 02/15/2021   Abdominal pain 01/02/2021   H/O adenomatous polyp of colon 05/05/2020   Iron deficiency anemia 05/05/2020   Acute gouty arthritis 10/25/2019   Sepsis ([place]) 04/02/2019   Leukocytosis 04/02/2019   Cellulitis of right hand 04/01/2019   Peripheral edema    CHF (congestive heart failure) ([place]) 07/05/2015   Acute congestive heart failure ([place]) 07/04/2015   Acute on chronic diastolic congestive heart failure ([place]) 07/04/2015   HLD (hyperlipidemia) 07/04/2015   Anxiety state 07/04/2015   GERD (gastroesophageal reflux disease) 07/04/2015   Heart block 07/04/2015   Edema 08/05/4817   Diastolic CHF, chronic ([place]) 03/11/2015   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   Muscle weakness (generalized) 01/13/2014   Knee stiffness 01/13/2014   Gout 12/11/2013   Cellulitis 11/19/2013   Expected blood loss anemia 11/17/2013   Obese 11/17/2013   S/P right TKA 11/15/2013   Anemia, normocytic normochromic 01/12/2013   Diabetes mellitus type 2 in obese ([place]) 12/10/2012   CAD S/P percutaneous coronary angioplasty 11/24/2012   Overweight(278.02) 11/17/2012   Obstructive sleep apnea  11/15/2012   Chronic kidney disease, stage 3, cardiorenal syndrome 08/23/2011   Essential hypertension     Past Surgical History:  Procedure Laterality Date   BACK SURGERY  2007   x2   BIOPSY  02/05/2021   Procedure: BIOPSY;  Surgeon: Daneil Dolin, MD;  Location: [facility];  Service: Endoscopy;;   CARDIAC CATHETERIZATION     with stent placement   COLONOSCOPY WITH ESOPHAGOGASTRODUODENOSCOPY (EGD) N/A 11/23/2013   Dr. Oneida Alar- TCS= left sided colitis and mild proctitis likely due to ischemic colitis, moderate internal hemorrhoids, small nodule in the rectum, bx=tubular adenoma, EGD=-mild  non-erosive gastritis   COLONOSCOPY WITH PROPOFOL N/A 07/24/2020   hemorrhoids,  rectosigmoid lipoma, small adenoma removed.   ESOPHAGOGASTRODUODENOSCOPY (EGD) WITH PROPOFOL N/A 02/05/2021   Normal esophagus, erythematous mucosa in entire stomach, no obvious ulcer, s/p biopsy. Normal duodenum. Negative H.pylori.    HERNIA REPAIR     LEFT HEART CATHETERIZATION WITH CORONARY ANGIOGRAM N/A 11/16/2012   Procedure: LEFT HEART CATHETERIZATION WITH CORONARY ANGIOGRAM;  Surgeon: Sherren Mocha, MD;  Location: [facility] CATH LAB;  Service: Cardiovascular;  Laterality: N/A;   PERCUTANEOUS CORONARY STENT INTERVENTION (PCI-S)  11/16/2012   Procedure: PERCUTANEOUS CORONARY STENT INTERVENTION (PCI-S);  Surgeon: Sherren Mocha, MD;  Location: [facility] CATH LAB;  Service: Cardiovascular;;   POLYPECTOMY  07/24/2020   Procedure: POLYPECTOMY;  Surgeon: Daneil Dolin, MD;  Location: [facility];  Service: Endoscopy;;   TOTAL KNEE ARTHROPLASTY Right 11/15/2013   Procedure: RIGHT TOTAL KNEE ARTHROPLASTY;  Surgeon: Mauri Pole, MD;  Location: [facility] ORS;  Service: Orthopedics;  Laterality: Right;   TOTAL KNEE ARTHROPLASTY Left 03/07/2015   Procedure: LEFT TOTAL KNEE ARTHROPLASTY;  Surgeon: Paralee Cancel, MD;  Location: [facility] ORS;  Service: Orthopedics;  Laterality: Left;       Family History  Problem Relation Age of Onset   Hypertension Other    Cancer Other    Heart disease Other    Diabetes Other    Hypertension Mother    Cancer Father        brain   CAD Brother    Colon cancer Neg Hx     Social History   Tobacco Use   Smoking status: Former    Packs/day: 1.00    Years: 30.00    Pack years: 30.00    Types: Cigarettes    Start date: 01/22/1965    Quit date: 12/23/1992    Years since quitting: 28.8   Smokeless tobacco: Never  Vaping Use   Vaping Use: Never used  Substance Use Topics   Alcohol use: No    Alcohol/week: 0.0 standard drinks   Drug use: No    Home Medications Prior to Admission medications    Medication Sig Start Date End Date Taking? Authorizing Provider  acarbose (PRECOSE) 50 MG tablet Take 50 mg by mouth in the morning, at noon, and at bedtime.  10/20/17   [provider]  acetaminophen (TYLENOL) 650 MG CR tablet Take 1,300 mg by mouth every 8 (eight) hours as needed for pain.    [provider]  allopurinol (ZYLOPRIM) 100 MG tablet Take 100-150 mg by mouth 2 (two) times daily. Takes 1.5 tablet in am and 1 tablet in evening 08/17/20   [provider]  amLODipine (NORVASC) 10 MG tablet Take 10 mg by mouth every evening.     [provider]  aspirin EC 81 MG tablet Take 1 tablet (81 mg total) by mouth daily. 09/14/19   Kate Sable  A, MD  calcitRIOL (ROCALTROL) 0.25 MCG capsule Take 0.25 mcg by mouth every Sunday.  12/13/16   [provider]  cholecalciferol (VITAMIN D) 1000 units tablet Take 1,000 Units daily by mouth.    [provider]  cloNIDine (CATAPRES) 0.1 MG tablet Take 0.1 mg by mouth 3 (three) times daily. 02/15/20   [provider]  dicyclomine (BENTYL) 10 MG capsule Take 10 mg by mouth 3 (three) times daily.    [provider]  furosemide (LASIX) 40 MG tablet Take 1 tablet (40 mg total) by mouth 2 (two) times daily. 08/09/19   Herminio Commons, MD  gabapentin (NEURONTIN) 300 MG capsule Take 300 mg by mouth 3 (three) times daily.  10/19/19   [provider]  HYDROcodone-acetaminophen (NORCO) 10-325 MG tablet Take 1-2 tablets by mouth 4 (four) times daily as needed for moderate pain or severe pain. 10/28/19   Manuella Ghazi, Pratik D, DO  insulin NPH Human (NOVOLIN N) 100 UNIT/ML injection Inject 0.18 mLs (18 Units total) into the skin every morning. Ands syringes 1/day 06/27/21   Renato Shin, MD  losartan (COZAAR) 25 MG tablet Take 25 mg by mouth daily.    [provider]  meclizine (ANTIVERT) 25 MG tablet Take 25 mg by mouth 3 (three) times daily as needed for dizziness.    [provider]  naloxegol oxalate (MOVANTIK) 12.5 MG TABS tablet Take 12.5 mg by mouth daily.    [provider]  nitroGLYCERIN (NITROSTAT) 0.4 MG SL tablet PLACE ONE TABLET UNDER THE TONGUE EVERY 5 MINUTES FOR 3 DOSES AS NEEDED Patient taking differently: Place 0.4 mg under the tongue every 5 (five) minutes as needed for chest pain. 07/22/17   Herminio Commons, MD  ondansetron (ZOFRAN) 4 MG tablet TAKE ONE TABLET (4MG TOTAL) BY MOUTH EVERY 8 HOURS AS NEEDED FOR NAUSEA OR VOMITING 04/24/21   Annitta Needs, NP  pantoprazole (PROTONIX) 40 MG tablet TAKE ONE TABLET (40MG TOTAL) BY MOUTH TWO TIMES DAILY BEFORE A MEAL 05/11/21   Erenest Rasher, PA-C  pravastatin (PRAVACHOL) 40 MG tablet Take 1 tablet (40 mg total) by mouth every evening. 07/31/21 10/29/21  Imogene Burn, PA-C  Sod Picosulfate-Mag Ox-Cit Acd (CLENPIQ) 10-3.5-12 MG-GM -GM/160ML SOLN Take 1 kit by mouth as directed. 08/08/21   Rourk, Cristopher Estimable, MD  TRUE METRIX BLOOD GLUCOSE TEST test strip USE TO TEST BLOOD SUGAR UP TO TWICE DAILY OR AS DIRECTED 08/31/21   Renato Shin, MD    Allergies    Niacin and related  Review of Systems   Review of Systems  Constitutional:  Negative for chills and fever.  HENT:  Negative for ear pain and sore throat.   Eyes:  Negative for pain and visual disturbance.  Respiratory:  Negative for cough and shortness of breath.   Cardiovascular:  Negative for chest pain and palpitations.  Gastrointestinal:  Negative for abdominal pain and vomiting.  Genitourinary:  Negative for dysuria and hematuria.  Musculoskeletal:  Positive for back pain. Negative for arthralgias.  Skin:  Negative for color change and rash.  Neurological:  Positive for numbness. Negative for seizures and syncope.  All other systems reviewed and are negative.  Physical Exam Updated Vital Signs BP (!) 164/69   Pulse 69   Temp 98 F (36.7 C) (Oral)   Resp 18   Ht _0  (1.88 m)   Wt 117.9 kg   SpO2 100%   BMI 33.38 kg/m    Physical Exam  Vitals and nursing note reviewed.  Constitutional:      Appearance: He is well-developed.  HENT:     Head: Normocephalic and atraumatic.  Eyes:     Conjunctiva/sclera: Conjunctivae normal.  Cardiovascular:     Rate and Rhythm: Normal rate and regular rhythm.     Heart sounds: No murmur heard. Pulmonary:     Effort: Pulmonary effort is normal. No respiratory distress.     Breath sounds: Normal breath sounds.  Abdominal:     Palpations: Abdomen is soft.     Tenderness: There is no abdominal tenderness.  Musculoskeletal:     Cervical back: Neck supple.     Right lower leg: No edema.     Left lower leg: Edema present.  Skin:    General: Skin is warm and dry.  Neurological:     Mental Status: He is alert.     Sensory: Sensory deficit (LLE numbness, subjective) present.    ED Results / Procedures / Treatments   Labs (all labs ordered are listed, but only abnormal results are displayed) Labs Reviewed - No data to display  EKG None  Radiology No results found.  Procedures Procedures   Medications Ordered in ED Medications - No data to display  ED Course  I have reviewed the triage vital signs and the nursing notes.  Pertinent labs & imaging results that were available during my care of the patient were reviewed by me and considered in my medical decision making (see chart for details).    MDM Rules/Calculators/A&P                           Patient presents emergency department for evaluation of back pain.  Physical exam reveals tenderness in the L-spine and subjective numbness to the left lower extremity greater than the right lower extremity.  He has no saddle anesthesia or weakness on exam.  Patient received a single dose of intramuscular morphine and states that his back pain had completely resolved.  He is ambulatory here in the emergency department and is requesting to leave the emergency department.  I told the patient that he should not drive a  vehicle after obtaining these medications and that he should call a family member.  Patient was adamant that he will drive himself and will not call his family member.  I reassessed the patient after short observation.  And he is ambulatory with no evidence of intoxication.  Patient was then discharged from the emergency department with again strong encouragement to call a family member prior to operating motor vehicle. Final Clinical Impression(s) / ED Diagnoses Final diagnoses:  None    Rx / DC Orders ED Discharge Orders     None        Quindarius Cabello, Debe Coder, MD 10/21/21 [phone]

## 2021-10-22 ENCOUNTER — Ambulatory Visit: Payer: Medicare Other | Admitting: Gastroenterology

## 2021-10-29 DIAGNOSIS — G629 Polyneuropathy, unspecified: Secondary | ICD-10-CM | POA: Diagnosis not present

## 2021-10-30 DIAGNOSIS — R809 Proteinuria, unspecified: Secondary | ICD-10-CM | POA: Diagnosis not present

## 2021-10-30 DIAGNOSIS — N189 Chronic kidney disease, unspecified: Secondary | ICD-10-CM | POA: Diagnosis not present

## 2021-10-30 DIAGNOSIS — E1122 Type 2 diabetes mellitus with diabetic chronic kidney disease: Secondary | ICD-10-CM | POA: Diagnosis not present

## 2021-10-30 DIAGNOSIS — E211 Secondary hyperparathyroidism, not elsewhere classified: Secondary | ICD-10-CM | POA: Diagnosis not present

## 2021-10-30 DIAGNOSIS — I129 Hypertensive chronic kidney disease with stage 1 through stage 4 chronic kidney disease, or unspecified chronic kidney disease: Secondary | ICD-10-CM | POA: Diagnosis not present

## 2021-10-30 DIAGNOSIS — E1129 Type 2 diabetes mellitus with other diabetic kidney complication: Secondary | ICD-10-CM | POA: Diagnosis not present

## 2021-11-01 ENCOUNTER — Other Ambulatory Visit (HOSPITAL_COMMUNITY): Payer: Self-pay | Admitting: Nephrology

## 2021-11-01 ENCOUNTER — Other Ambulatory Visit: Payer: Self-pay | Admitting: Nephrology

## 2021-11-01 DIAGNOSIS — I5032 Chronic diastolic (congestive) heart failure: Secondary | ICD-10-CM | POA: Diagnosis not present

## 2021-11-01 DIAGNOSIS — E211 Secondary hyperparathyroidism, not elsewhere classified: Secondary | ICD-10-CM | POA: Diagnosis not present

## 2021-11-01 DIAGNOSIS — I129 Hypertensive chronic kidney disease with stage 1 through stage 4 chronic kidney disease, or unspecified chronic kidney disease: Secondary | ICD-10-CM

## 2021-11-01 DIAGNOSIS — R809 Proteinuria, unspecified: Secondary | ICD-10-CM

## 2021-11-01 DIAGNOSIS — N189 Chronic kidney disease, unspecified: Secondary | ICD-10-CM | POA: Diagnosis not present

## 2021-11-01 DIAGNOSIS — E1122 Type 2 diabetes mellitus with diabetic chronic kidney disease: Secondary | ICD-10-CM

## 2021-11-01 DIAGNOSIS — E1129 Type 2 diabetes mellitus with other diabetic kidney complication: Secondary | ICD-10-CM | POA: Diagnosis not present

## 2021-11-01 DIAGNOSIS — N170 Acute kidney failure with tubular necrosis: Secondary | ICD-10-CM | POA: Diagnosis not present

## 2021-11-01 DIAGNOSIS — D638 Anemia in other chronic diseases classified elsewhere: Secondary | ICD-10-CM | POA: Diagnosis not present

## 2021-11-09 ENCOUNTER — Other Ambulatory Visit: Payer: Self-pay

## 2021-11-09 ENCOUNTER — Ambulatory Visit (HOSPITAL_COMMUNITY)
Admission: RE | Admit: 2021-11-09 | Discharge: 2021-11-09 | Disposition: A | Discharge status: Home / Self Care | Payer: Medicare Other | Source: Ambulatory Visit | Attending: Nephrology | Admitting: Nephrology

## 2021-11-09 DIAGNOSIS — I129 Hypertensive chronic kidney disease with stage 1 through stage 4 chronic kidney disease, or unspecified chronic kidney disease: Secondary | ICD-10-CM | POA: Diagnosis not present

## 2021-11-09 DIAGNOSIS — E1129 Type 2 diabetes mellitus with other diabetic kidney complication: Secondary | ICD-10-CM

## 2021-11-09 DIAGNOSIS — E1122 Type 2 diabetes mellitus with diabetic chronic kidney disease: Secondary | ICD-10-CM

## 2021-11-09 DIAGNOSIS — R809 Proteinuria, unspecified: Secondary | ICD-10-CM | POA: Insufficient documentation

## 2021-11-09 DIAGNOSIS — N170 Acute kidney failure with tubular necrosis: Secondary | ICD-10-CM | POA: Diagnosis not present

## 2021-11-09 DIAGNOSIS — N281 Cyst of kidney, acquired: Secondary | ICD-10-CM | POA: Diagnosis not present

## 2021-11-14 DIAGNOSIS — R809 Proteinuria, unspecified: Secondary | ICD-10-CM | POA: Diagnosis not present

## 2021-11-14 DIAGNOSIS — N170 Acute kidney failure with tubular necrosis: Secondary | ICD-10-CM | POA: Diagnosis not present

## 2021-11-14 DIAGNOSIS — E1122 Type 2 diabetes mellitus with diabetic chronic kidney disease: Secondary | ICD-10-CM | POA: Diagnosis not present

## 2021-11-14 DIAGNOSIS — I129 Hypertensive chronic kidney disease with stage 1 through stage 4 chronic kidney disease, or unspecified chronic kidney disease: Secondary | ICD-10-CM | POA: Diagnosis not present

## 2021-11-14 DIAGNOSIS — N189 Chronic kidney disease, unspecified: Secondary | ICD-10-CM | POA: Diagnosis not present

## 2021-11-20 DIAGNOSIS — I129 Hypertensive chronic kidney disease with stage 1 through stage 4 chronic kidney disease, or unspecified chronic kidney disease: Secondary | ICD-10-CM | POA: Diagnosis not present

## 2021-11-20 DIAGNOSIS — E1122 Type 2 diabetes mellitus with diabetic chronic kidney disease: Secondary | ICD-10-CM | POA: Diagnosis not present

## 2021-11-20 DIAGNOSIS — E1129 Type 2 diabetes mellitus with other diabetic kidney complication: Secondary | ICD-10-CM | POA: Diagnosis not present

## 2021-11-20 DIAGNOSIS — E211 Secondary hyperparathyroidism, not elsewhere classified: Secondary | ICD-10-CM | POA: Diagnosis not present

## 2021-11-20 DIAGNOSIS — N170 Acute kidney failure with tubular necrosis: Secondary | ICD-10-CM | POA: Diagnosis not present

## 2021-11-20 DIAGNOSIS — N189 Chronic kidney disease, unspecified: Secondary | ICD-10-CM | POA: Diagnosis not present

## 2021-11-20 DIAGNOSIS — R809 Proteinuria, unspecified: Secondary | ICD-10-CM | POA: Diagnosis not present

## 2021-11-20 DIAGNOSIS — D638 Anemia in other chronic diseases classified elsewhere: Secondary | ICD-10-CM | POA: Diagnosis not present

## 2021-11-23 DIAGNOSIS — M5442 Lumbago with sciatica, left side: Secondary | ICD-10-CM | POA: Diagnosis not present

## 2021-11-23 DIAGNOSIS — G8929 Other chronic pain: Secondary | ICD-10-CM | POA: Diagnosis not present

## 2021-11-27 DIAGNOSIS — M5030 Other cervical disc degeneration, unspecified cervical region: Secondary | ICD-10-CM | POA: Diagnosis not present

## 2021-11-27 DIAGNOSIS — H6121 Impacted cerumen, right ear: Secondary | ICD-10-CM | POA: Diagnosis not present

## 2021-11-27 DIAGNOSIS — M5136 Other intervertebral disc degeneration, lumbar region: Secondary | ICD-10-CM | POA: Diagnosis not present

## 2021-11-27 DIAGNOSIS — M1991 Primary osteoarthritis, unspecified site: Secondary | ICD-10-CM | POA: Diagnosis not present

## 2021-11-27 DIAGNOSIS — M159 Polyosteoarthritis, unspecified: Secondary | ICD-10-CM | POA: Diagnosis not present

## 2021-11-27 DIAGNOSIS — M48061 Spinal stenosis, lumbar region without neurogenic claudication: Secondary | ICD-10-CM | POA: Diagnosis not present

## 2021-11-29 DIAGNOSIS — R809 Proteinuria, unspecified: Secondary | ICD-10-CM | POA: Diagnosis not present

## 2021-11-29 DIAGNOSIS — N170 Acute kidney failure with tubular necrosis: Secondary | ICD-10-CM | POA: Diagnosis not present

## 2021-11-29 DIAGNOSIS — I129 Hypertensive chronic kidney disease with stage 1 through stage 4 chronic kidney disease, or unspecified chronic kidney disease: Secondary | ICD-10-CM | POA: Diagnosis not present

## 2021-11-29 DIAGNOSIS — N189 Chronic kidney disease, unspecified: Secondary | ICD-10-CM | POA: Diagnosis not present

## 2021-11-29 DIAGNOSIS — E1122 Type 2 diabetes mellitus with diabetic chronic kidney disease: Secondary | ICD-10-CM | POA: Diagnosis not present

## 2021-12-03 DIAGNOSIS — R600 Localized edema: Secondary | ICD-10-CM | POA: Diagnosis not present

## 2021-12-03 DIAGNOSIS — M961 Postlaminectomy syndrome, not elsewhere classified: Secondary | ICD-10-CM | POA: Diagnosis not present

## 2021-12-03 DIAGNOSIS — N184 Chronic kidney disease, stage 4 (severe): Secondary | ICD-10-CM | POA: Diagnosis not present

## 2021-12-03 DIAGNOSIS — M47816 Spondylosis without myelopathy or radiculopathy, lumbar region: Secondary | ICD-10-CM | POA: Diagnosis not present

## 2021-12-03 DIAGNOSIS — G629 Polyneuropathy, unspecified: Secondary | ICD-10-CM | POA: Diagnosis not present

## 2021-12-12 DIAGNOSIS — M47816 Spondylosis without myelopathy or radiculopathy, lumbar region: Secondary | ICD-10-CM | POA: Diagnosis not present

## 2021-12-26 DIAGNOSIS — M159 Polyosteoarthritis, unspecified: Secondary | ICD-10-CM | POA: Diagnosis not present

## 2021-12-26 DIAGNOSIS — G894 Chronic pain syndrome: Secondary | ICD-10-CM | POA: Diagnosis not present

## 2021-12-26 DIAGNOSIS — M48061 Spinal stenosis, lumbar region without neurogenic claudication: Secondary | ICD-10-CM | POA: Diagnosis not present

## 2021-12-26 DIAGNOSIS — M5030 Other cervical disc degeneration, unspecified cervical region: Secondary | ICD-10-CM | POA: Diagnosis not present

## 2021-12-26 DIAGNOSIS — M1991 Primary osteoarthritis, unspecified site: Secondary | ICD-10-CM | POA: Diagnosis not present

## 2021-12-26 DIAGNOSIS — M5136 Other intervertebral disc degeneration, lumbar region: Secondary | ICD-10-CM | POA: Diagnosis not present

## 2022-01-02 DIAGNOSIS — M47816 Spondylosis without myelopathy or radiculopathy, lumbar region: Secondary | ICD-10-CM | POA: Diagnosis not present

## 2022-01-18 DIAGNOSIS — R809 Proteinuria, unspecified: Secondary | ICD-10-CM | POA: Diagnosis not present

## 2022-01-18 DIAGNOSIS — I129 Hypertensive chronic kidney disease with stage 1 through stage 4 chronic kidney disease, or unspecified chronic kidney disease: Secondary | ICD-10-CM | POA: Diagnosis not present

## 2022-01-18 DIAGNOSIS — N170 Acute kidney failure with tubular necrosis: Secondary | ICD-10-CM | POA: Diagnosis not present

## 2022-01-18 DIAGNOSIS — N189 Chronic kidney disease, unspecified: Secondary | ICD-10-CM | POA: Diagnosis not present

## 2022-01-18 DIAGNOSIS — E1122 Type 2 diabetes mellitus with diabetic chronic kidney disease: Secondary | ICD-10-CM | POA: Diagnosis not present

## 2022-01-18 DIAGNOSIS — E211 Secondary hyperparathyroidism, not elsewhere classified: Secondary | ICD-10-CM | POA: Diagnosis not present

## 2022-01-24 DIAGNOSIS — R809 Proteinuria, unspecified: Secondary | ICD-10-CM | POA: Diagnosis not present

## 2022-01-24 DIAGNOSIS — E211 Secondary hyperparathyroidism, not elsewhere classified: Secondary | ICD-10-CM | POA: Diagnosis not present

## 2022-01-24 DIAGNOSIS — I5032 Chronic diastolic (congestive) heart failure: Secondary | ICD-10-CM | POA: Diagnosis not present

## 2022-01-24 DIAGNOSIS — D638 Anemia in other chronic diseases classified elsewhere: Secondary | ICD-10-CM | POA: Diagnosis not present

## 2022-01-24 DIAGNOSIS — E1129 Type 2 diabetes mellitus with other diabetic kidney complication: Secondary | ICD-10-CM | POA: Diagnosis not present

## 2022-01-24 DIAGNOSIS — I129 Hypertensive chronic kidney disease with stage 1 through stage 4 chronic kidney disease, or unspecified chronic kidney disease: Secondary | ICD-10-CM | POA: Diagnosis not present

## 2022-01-24 DIAGNOSIS — N170 Acute kidney failure with tubular necrosis: Secondary | ICD-10-CM | POA: Diagnosis not present

## 2022-01-24 DIAGNOSIS — N189 Chronic kidney disease, unspecified: Secondary | ICD-10-CM | POA: Diagnosis not present

## 2022-01-24 DIAGNOSIS — E1122 Type 2 diabetes mellitus with diabetic chronic kidney disease: Secondary | ICD-10-CM | POA: Diagnosis not present

## 2022-01-28 DIAGNOSIS — M48061 Spinal stenosis, lumbar region without neurogenic claudication: Secondary | ICD-10-CM | POA: Diagnosis not present

## 2022-01-28 DIAGNOSIS — E1140 Type 2 diabetes mellitus with diabetic neuropathy, unspecified: Secondary | ICD-10-CM | POA: Diagnosis not present

## 2022-01-28 DIAGNOSIS — G894 Chronic pain syndrome: Secondary | ICD-10-CM | POA: Diagnosis not present

## 2022-01-28 DIAGNOSIS — M5136 Other intervertebral disc degeneration, lumbar region: Secondary | ICD-10-CM | POA: Diagnosis not present

## 2022-01-28 DIAGNOSIS — M5030 Other cervical disc degeneration, unspecified cervical region: Secondary | ICD-10-CM | POA: Diagnosis not present

## 2022-01-29 DIAGNOSIS — M47816 Spondylosis without myelopathy or radiculopathy, lumbar region: Secondary | ICD-10-CM | POA: Diagnosis not present

## 2022-02-05 ENCOUNTER — Ambulatory Visit: Payer: Medicare Other | Admitting: Cardiology

## 2022-02-05 DIAGNOSIS — N170 Acute kidney failure with tubular necrosis: Secondary | ICD-10-CM | POA: Diagnosis not present

## 2022-02-05 DIAGNOSIS — G894 Chronic pain syndrome: Secondary | ICD-10-CM | POA: Diagnosis not present

## 2022-02-05 DIAGNOSIS — I129 Hypertensive chronic kidney disease with stage 1 through stage 4 chronic kidney disease, or unspecified chronic kidney disease: Secondary | ICD-10-CM | POA: Diagnosis not present

## 2022-02-05 DIAGNOSIS — N189 Chronic kidney disease, unspecified: Secondary | ICD-10-CM | POA: Diagnosis not present

## 2022-02-05 DIAGNOSIS — R809 Proteinuria, unspecified: Secondary | ICD-10-CM | POA: Diagnosis not present

## 2022-02-05 DIAGNOSIS — E1122 Type 2 diabetes mellitus with diabetic chronic kidney disease: Secondary | ICD-10-CM | POA: Diagnosis not present

## 2022-02-06 ENCOUNTER — Observation Stay (HOSPITAL_COMMUNITY)
Admission: EM | Admit: 2022-02-06 | Discharge: 2022-02-07 | Disposition: A | Discharge status: Home / Self Care | Payer: Medicare Other | Attending: Family Medicine | Admitting: Family Medicine

## 2022-02-06 ENCOUNTER — Emergency Department (HOSPITAL_COMMUNITY): Payer: Medicare Other

## 2022-02-06 ENCOUNTER — Other Ambulatory Visit: Payer: Self-pay

## 2022-02-06 DIAGNOSIS — M150 Primary generalized (osteo)arthritis: Secondary | ICD-10-CM | POA: Insufficient documentation

## 2022-02-06 DIAGNOSIS — Z7982 Long term (current) use of aspirin: Secondary | ICD-10-CM | POA: Diagnosis not present

## 2022-02-06 DIAGNOSIS — E119 Type 2 diabetes mellitus without complications: Secondary | ICD-10-CM | POA: Diagnosis not present

## 2022-02-06 DIAGNOSIS — Z20822 Contact with and (suspected) exposure to covid-19: Secondary | ICD-10-CM | POA: Insufficient documentation

## 2022-02-06 DIAGNOSIS — I129 Hypertensive chronic kidney disease with stage 1 through stage 4 chronic kidney disease, or unspecified chronic kidney disease: Secondary | ICD-10-CM | POA: Diagnosis not present

## 2022-02-06 DIAGNOSIS — N184 Chronic kidney disease, stage 4 (severe): Secondary | ICD-10-CM | POA: Diagnosis not present

## 2022-02-06 DIAGNOSIS — E669 Obesity, unspecified: Secondary | ICD-10-CM | POA: Diagnosis present

## 2022-02-06 DIAGNOSIS — R072 Precordial pain: Secondary | ICD-10-CM

## 2022-02-06 DIAGNOSIS — Z955 Presence of coronary angioplasty implant and graft: Secondary | ICD-10-CM | POA: Insufficient documentation

## 2022-02-06 DIAGNOSIS — N183 Chronic kidney disease, stage 3 unspecified: Secondary | ICD-10-CM | POA: Diagnosis present

## 2022-02-06 DIAGNOSIS — Z859 Personal history of malignant neoplasm, unspecified: Secondary | ICD-10-CM | POA: Diagnosis not present

## 2022-02-06 DIAGNOSIS — R0789 Other chest pain: Secondary | ICD-10-CM | POA: Diagnosis not present

## 2022-02-06 DIAGNOSIS — E785 Hyperlipidemia, unspecified: Secondary | ICD-10-CM | POA: Diagnosis present

## 2022-02-06 DIAGNOSIS — R0602 Shortness of breath: Secondary | ICD-10-CM | POA: Diagnosis not present

## 2022-02-06 DIAGNOSIS — R079 Chest pain, unspecified: Secondary | ICD-10-CM | POA: Diagnosis not present

## 2022-02-06 DIAGNOSIS — Z6835 Body mass index (BMI) 35.0-35.9, adult: Secondary | ICD-10-CM | POA: Insufficient documentation

## 2022-02-06 DIAGNOSIS — I5031 Acute diastolic (congestive) heart failure: Secondary | ICD-10-CM | POA: Diagnosis not present

## 2022-02-06 DIAGNOSIS — I10 Essential (primary) hypertension: Secondary | ICD-10-CM | POA: Diagnosis present

## 2022-02-06 DIAGNOSIS — M1A9XX1 Chronic gout, unspecified, 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ophi): Secondary | ICD-10-CM | POA: Insufficient documentation

## 2022-02-06 DIAGNOSIS — Z96653 Presence of artificial knee joint, bilateral: Secondary | ICD-10-CM | POA: Diagnosis not present

## 2022-02-06 DIAGNOSIS — R778 Other specified abnormalities of plasma proteins: Secondary | ICD-10-CM | POA: Diagnosis not present

## 2022-02-06 DIAGNOSIS — I130 Hypertensive heart and chronic kidney disease with heart failure and stage 1 through stage 4 chronic kidney disease, or unspecified chronic kidney disease: Secondary | ICD-10-CM | POA: Diagnosis not present

## 2022-02-06 DIAGNOSIS — Z794 Long term (current) use of insulin: Secondary | ICD-10-CM | POA: Diagnosis not present

## 2022-02-06 DIAGNOSIS — N189 Chronic kidney disease, unspecified: Secondary | ICD-10-CM | POA: Diagnosis not present

## 2022-02-06 DIAGNOSIS — M5136 Other intervertebral disc degeneration, lumbar region: Secondary | ICD-10-CM | POA: Insufficient documentation

## 2022-02-06 DIAGNOSIS — Z79899 Other long term (current) drug therapy: Secondary | ICD-10-CM | POA: Insufficient documentation

## 2022-02-06 DIAGNOSIS — K219 Gastro-esophageal reflux disease without esophagitis: Secondary | ICD-10-CM | POA: Diagnosis present

## 2022-02-06 DIAGNOSIS — R7989 Other specified abnormal findings of blood chemistry: Secondary | ICD-10-CM

## 2022-02-06 DIAGNOSIS — I2510 Atherosclerotic heart disease of native coronary artery without angina pectoris: Secondary | ICD-10-CM

## 2022-02-06 DIAGNOSIS — E782 Mixed hyperlipidemia: Secondary | ICD-10-CM | POA: Diagnosis present

## 2022-02-06 DIAGNOSIS — E1169 Type 2 diabetes mellitus with other specified complication: Secondary | ICD-10-CM | POA: Diagnosis present

## 2022-02-06 DIAGNOSIS — I517 Cardiomegaly: Secondary | ICD-10-CM | POA: Diagnosis not present

## 2022-02-06 LAB — CBC
HCT: 31.9 % — ABNORMAL LOW (ref 39.0–52.0)
Hemoglobin: 10 g/dL — ABNORMAL LOW (ref 13.0–17.0)
MCH: 31.5 pg (ref 26.0–34.0)
MCHC: 31.3 g/dL (ref 30.0–36.0)
MCV: 100.6 fL — ABNORMAL HIGH (ref 80.0–100.0)
Platelets: 177 10*3/uL (ref 150–400)
RBC: 3.17 MIL/uL — ABNORMAL LOW (ref 4.22–5.81)
RDW: 14.5 % (ref 11.5–15.5)
WBC: 5.9 10*3/uL (ref 4.0–10.5)
nRBC: 0 % (ref 0.0–0.2)

## 2022-02-06 LAB — BASIC METABOLIC PANEL
Anion gap: 11 (ref 5–15)
BUN: 73 mg/dL — ABNORMAL HIGH (ref 8–23)
CO2: 21 mmol/L — ABNORMAL LOW (ref 22–32)
Calcium: 9.3 mg/dL (ref 8.9–10.3)
Chloride: 107 mmol/L (ref 98–111)
Creatinine, Ser: 2.81 mg/dL — ABNORMAL HIGH (ref 0.61–1.24)
GFR, Estimated: 23 mL/min — ABNORMAL LOW (ref >60)
Glucose, Bld: 113 mg/dL — ABNORMAL HIGH (ref 70–99)
Potassium: 4.3 mmol/L (ref 3.5–5.1)
Sodium: 139 mmol/L (ref 135–145)

## 2022-02-06 LAB — TROPONIN I (HIGH SENSITIVITY)
Troponin I (High Sensitivity): 24 ng/L — ABNORMAL HIGH (ref <18)
Troponin I (High Sensitivity): 41 ng/L — ABNORMAL HIGH (ref <18)
Troponin I (High Sensitivity): 40 ng/L — ABNORMAL HIGH (ref <18)

## 2022-02-06 LAB — PROTIME-INR
INR: 1 (ref 0.8–1.2)
Prothrombin Time: 13.3 seconds (ref 11.4–15.2)

## 2022-02-06 MED ORDER — HEPARIN SODIUM (PORCINE) 5000 UNIT/ML IJ SOLN
5000.0000 [IU] | Freq: Three times a day (TID) | INTRAMUSCULAR | Status: DC
  Administered 2022-02-07 (×2): 5000 [IU] via SUBCUTANEOUS
  Filled 2022-02-06 (×2): qty 1

## 2022-02-06 MED ORDER — ACETAMINOPHEN 325 MG PO TABS
650.0000 mg | ORAL_TABLET | ORAL | Status: DC | PRN
  Administered 2022-02-07: 650 mg via ORAL
  Filled 2022-02-06: qty 2

## 2022-02-06 MED ORDER — PRAVASTATIN SODIUM 40 MG PO TABS
40.0000 mg | ORAL_TABLET | Freq: Every evening | ORAL | Status: DC
  Administered 2022-02-07: 40 mg via ORAL
  Filled 2022-02-06 (×2): qty 1

## 2022-02-06 MED ORDER — AMLODIPINE BESYLATE 5 MG PO TABS
10.0000 mg | ORAL_TABLET | Freq: Every evening | ORAL | Status: DC
  Filled 2022-02-06: qty 2

## 2022-02-06 MED ORDER — ONDANSETRON HCL 4 MG/2ML IJ SOLN: 4.0000 mg | Freq: Four times a day (QID) | INTRAMUSCULAR | Status: DC | PRN

## 2022-02-06 MED ORDER — LOSARTAN POTASSIUM 25 MG PO TABS
25.0000 mg | ORAL_TABLET | Freq: Every day | ORAL | Status: DC
  Administered 2022-02-07: 25 mg via ORAL
  Filled 2022-02-06: qty 1

## 2022-02-06 MED ORDER — ASPIRIN EC 81 MG PO TBEC
81.0000 mg | DELAYED_RELEASE_TABLET | Freq: Every day | ORAL | Status: DC
  Administered 2022-02-07: 81 mg via ORAL
  Filled 2022-02-06: qty 1

## 2022-02-06 MED ORDER — INSULIN ASPART 100 UNIT/ML IJ SOLN: 0.0000 [IU] | Freq: Every day | INTRAMUSCULAR | Status: DC

## 2022-02-06 MED ORDER — HYDRALAZINE HCL 25 MG PO TABS
50.0000 mg | ORAL_TABLET | Freq: Three times a day (TID) | ORAL | Status: DC
  Administered 2022-02-07 (×2): 50 mg via ORAL
  Filled 2022-02-06 (×2): qty 2

## 2022-02-06 MED ORDER — PANTOPRAZOLE SODIUM 40 MG PO TBEC
40.0000 mg | DELAYED_RELEASE_TABLET | Freq: Every day | ORAL | Status: DC
  Administered 2022-02-07: 40 mg via ORAL
  Filled 2022-02-06: qty 1

## 2022-02-06 MED ORDER — OXYCODONE-ACETAMINOPHEN 5-325 MG PO TABS
1.0000 | ORAL_TABLET | Freq: Once | ORAL | Status: AC
  Administered 2022-02-06: 1 via ORAL
  Filled 2022-02-06: qty 1

## 2022-02-06 MED ORDER — INSULIN ASPART 100 UNIT/ML IJ SOLN: 0.0000 [IU] | Freq: Three times a day (TID) | INTRAMUSCULAR | Status: DC

## 2022-02-06 MED ORDER — FUROSEMIDE 40 MG PO TABS
40.0000 mg | ORAL_TABLET | Freq: Every day | ORAL | Status: DC
  Administered 2022-02-07: 40 mg via ORAL
  Filled 2022-02-06: qty 1

## 2022-02-06 MED ORDER — ASPIRIN 81 MG PO CHEW
324.0000 mg | CHEWABLE_TABLET | Freq: Once | ORAL | Status: AC
  Administered 2022-02-06: 324 mg via ORAL
  Filled 2022-02-06: qty 4

## 2022-02-06 MED ORDER — GABAPENTIN 300 MG PO CAPS
300.0000 mg | ORAL_CAPSULE | Freq: Three times a day (TID) | ORAL | Status: DC
  Administered 2022-02-07 (×2): 300 mg via ORAL
  Filled 2022-02-06 (×2): qty 1

## 2022-02-06 MED ORDER — INSULIN DETEMIR 100 UNIT/ML ~~LOC~~ SOLN
10.0000 [IU] | Freq: Every day | SUBCUTANEOUS | Status: DC
  Administered 2022-02-07: 10 [IU] via SUBCUTANEOUS
  Filled 2022-02-06 (×2): qty 0.1

## 2022-02-06 MED ORDER — CLONIDINE HCL 0.1 MG PO TABS
0.1000 mg | ORAL_TABLET | Freq: Three times a day (TID) | ORAL | Status: DC
  Administered 2022-02-07: 0.1 mg via ORAL
  Filled 2022-02-06 (×2): qty 1

## 2022-02-06 NOTE — ED Triage Notes (Signed)
Chest pain 6/10 with sob that started one hour pta. Pain is constant and sharp. Pt has not taken nitro or aspirin.

## 2022-02-06 NOTE — ED Provider Notes (Signed)
[facility] Provider Note   CSN: [id number] Arrival date & time: 02/06/22  1537     History  Chief Complaint  Patient presents with   Chest Pain    [name] is a 76 y.o. male.  Patient with hx heart disease, c/o mid chest pain at rest today. Symptoms acute onset, moderate, persistent, dull, non radiating, not pleuritic. Denies associated sob, nv or diaphoresis. Indicates hx stents, unsure if symptoms similar. Denies fever or chills. No cough or uri symptoms. No heartburn. No leg pain or swelling. Denies hx dvt or pe.   The history is provided by the patient and medical records.  Chest Pain Associated symptoms: no abdominal pain, no back pain, no fever, no headache, no palpitations, no shortness of breath and no vomiting       Home Medications Prior to Admission medications   Medication Sig Start Date End Date Taking? Authorizing Provider  acarbose (PRECOSE) 50 MG tablet Take 50 mg by mouth in the morning, at noon, and at bedtime.  10/20/17   [provider]  acetaminophen (TYLENOL) 650 MG CR tablet Take 1,300 mg by mouth every 8 (eight) hours as needed for pain.    [provider]  allopurinol (ZYLOPRIM) 100 MG tablet Take 100-150 mg by mouth 2 (two) times daily. Takes 1.5 tablet in am and 1 tablet in evening 08/17/20   [provider]  amLODipine (NORVASC) 10 MG tablet Take 10 mg by mouth every evening.     [provider]  aspirin EC 81 MG tablet Take 1 tablet (81 mg total) by mouth daily. 09/14/19   Herminio Commons, MD  calcitRIOL (ROCALTROL) 0.25 MCG capsule Take 0.25 mcg by mouth every Sunday.  12/13/16   [provider]  cholecalciferol (VITAMIN D) 1000 units tablet Take 1,000 Units daily by mouth.    [provider]  cloNIDine (CATAPRES) 0.1 MG tablet Take 0.1 mg by mouth 3 (three) times daily. 02/15/20   [provider]  dicyclomine (BENTYL) 10 MG capsule Take 10 mg by mouth 3  (three) times daily.    [provider]  furosemide (LASIX) 40 MG tablet Take 1 tablet (40 mg total) by mouth 2 (two) times daily. 08/09/19   Herminio Commons, MD  gabapentin (NEURONTIN) 300 MG capsule Take 300 mg by mouth 3 (three) times daily.  10/19/19   [provider]  HYDROcodone-acetaminophen (NORCO) 10-325 MG tablet Take 1-2 tablets by mouth 4 (four) times daily as needed for moderate pain or severe pain. 10/28/19   Manuella Ghazi, Pratik D, DO  insulin NPH Human (NOVOLIN N) 100 UNIT/ML injection Inject 0.18 mLs (18 Units total) into the skin every morning. Ands syringes 1/day 06/27/21   Renato Shin, MD  losartan (COZAAR) 25 MG tablet Take 25 mg by mouth daily.    [provider]  meclizine (ANTIVERT) 25 MG tablet Take 25 mg by mouth 3 (three) times daily as needed for dizziness.    [provider]  naloxegol oxalate (MOVANTIK) 12.5 MG TABS tablet Take 12.5 mg by mouth daily.    [provider]  nitroGLYCERIN (NITROSTAT) 0.4 MG SL tablet PLACE ONE TABLET UNDER THE TONGUE EVERY 5 MINUTES FOR 3 DOSES AS NEEDED Patient taking differently: Place 0.4 mg under the tongue every 5 (five) minutes as needed for chest pain. 07/22/17   Herminio Commons, MD  ondansetron (ZOFRAN) 4 MG tablet TAKE ONE TABLET (4MG TOTAL) BY MOUTH EVERY 8 HOURS AS  NEEDED FOR NAUSEA OR VOMITING 04/24/21   Annitta Needs, NP  pantoprazole (PROTONIX) 40 MG tablet TAKE ONE TABLET (40MG TOTAL) BY MOUTH TWO TIMES DAILY BEFORE A MEAL 05/11/21   Erenest Rasher, PA-C  pravastatin (PRAVACHOL) 40 MG tablet Take 1 tablet (40 mg total) by mouth every evening. 07/31/21 10/29/21  Imogene Burn, PA-C  Sod Picosulfate-Mag Ox-Cit Acd (CLENPIQ) 10-3.5-12 MG-GM -GM/160ML SOLN Take 1 kit by mouth as directed. 08/08/21   Rourk, Cristopher Estimable, MD  TRUE METRIX BLOOD GLUCOSE TEST test strip USE TO TEST BLOOD SUGAR UP TO TWICE DAILY OR AS DIRECTED 08/31/21   Renato Shin, MD      Allergies    Metformin, Niacin, and  Niacin and related    Review of Systems   Review of Systems  Constitutional:  Negative for fever.  HENT:  Negative for sore throat.   Eyes:  Negative for redness.  Respiratory:  Negative for shortness of breath.   Cardiovascular:  Positive for chest pain. Negative for palpitations and leg swelling.  Gastrointestinal:  Negative for abdominal pain and vomiting.  Genitourinary:  Negative for flank pain.  Musculoskeletal:  Negative for back pain and neck pain.  Skin:  Negative for rash.  Neurological:  Negative for headaches.  Hematological:  Does not bruise/bleed easily.  Psychiatric/Behavioral:  Negative for confusion.    Physical Exam Updated Vital Signs BP (!) 155/88 (BP Location: Right Arm)    Pulse 94    Temp 97.7 F (36.5 C) (Oral)    Resp 16    Ht 1.88 m (6' 2")    Wt 120.2 kg    SpO2 99%    BMI 34.02 kg/m  Physical Exam Vitals and nursing note reviewed.  Constitutional:      Appearance: Normal appearance. He is well-developed.  HENT:     Head: Atraumatic.     Nose: Nose normal.     Mouth/Throat:     Mouth: Mucous membranes are moist.     Pharynx: Oropharynx is clear.  Eyes:     General: No scleral icterus.    Conjunctiva/sclera: Conjunctivae normal.  Neck:     Trachea: No tracheal deviation.  Cardiovascular:     Rate and Rhythm: Normal rate and regular rhythm.     Pulses: Normal pulses.     Heart sounds: Normal heart sounds. No murmur heard.   No friction rub. No gallop.  Pulmonary:     Effort: Pulmonary effort is normal. No accessory muscle usage or respiratory distress.     Breath sounds: Normal breath sounds.  Chest:     Chest wall: No tenderness.  Abdominal:     General: Bowel sounds are normal. There is no distension.     Palpations: Abdomen is soft.     Tenderness: There is no abdominal tenderness. There is no guarding.  Genitourinary:    Comments: No cva tenderness. Musculoskeletal:        General: No swelling or tenderness.     Cervical back: Normal  range of motion and neck supple. No rigidity.     Right lower leg: No edema.     Left lower leg: No edema.  Skin:    General: Skin is warm and dry.     Findings: No rash.  Neurological:     Mental Status: He is alert.     Comments: Alert, speech clear.   Psychiatric:        Mood and Affect: Mood normal.    ED  Results / Procedures / Treatments   Labs (all labs ordered are listed, but only abnormal results are displayed) Results for orders placed or performed during the hospital encounter of 57/32/20  Basic metabolic panel  Result Value Ref Range   Sodium 139 135 - 145 mmol/L   Potassium 4.3 3.5 - 5.1 mmol/L   Chloride 107 98 - 111 mmol/L   CO2 21 (L) 22 - 32 mmol/L   Glucose, Bld 113 (H) 70 - 99 mg/dL   BUN 73 (H) 8 - 23 mg/dL   Creatinine, Ser 2.81 (H) 0.61 - 1.24 mg/dL   Calcium 9.3 8.9 - 10.3 mg/dL   GFR, Estimated 23 (L) >60 mL/min   Anion gap 11 5 - 15  CBC  Result Value Ref Range   WBC 5.9 4.0 - 10.5 K/uL   RBC 3.17 (L) 4.22 - 5.81 MIL/uL   Hemoglobin 10.0 (L) 13.0 - 17.0 g/dL   HCT 31.9 (L) 39.0 - 52.0 %   MCV 100.6 (H) 80.0 - 100.0 fL   MCH 31.5 26.0 - 34.0 pg   MCHC 31.3 30.0 - 36.0 g/dL   RDW 14.5 11.5 - 15.5 %   Platelets 177 150 - 400 K/uL   nRBC 0.0 0.0 - 0.2 %  Protime-INR (order if Patient is taking Coumadin / Warfarin)  Result Value Ref Range   Prothrombin Time 13.3 11.4 - 15.2 seconds   INR 1.0 0.8 - 1.2  Troponin I (High Sensitivity)  Result Value Ref Range   Troponin I (High Sensitivity) 24 (H) <18 ng/L  Troponin I (High Sensitivity)  Result Value Ref Range   Troponin I (High Sensitivity) 41 (H) <18 ng/L     EKG EKG Interpretation  Date/Time:  Wednesday February 06 2022 15:44:24 EST Ventricular Rate:  91 PR Interval:    QRS Duration: 102 QT Interval:  382 QTC Calculation: 469 R Axis:   -8 Text Interpretation: Sinus rhythm with 1st degree A-V block Left ventricular hypertrophy with repolarization abnormality Confirmed by Lajean Saver  [phone]) on 02/06/2022 4:22:48 PM  Radiology DG Chest Port 1 View  Result Date: 02/06/2022 CLINICAL DATA:  Chest pain rated at 6/10 and shortness of breath beginning today, pain is constant and sharp, history hypertension EXAM: PORTABLE CHEST 1 VIEW COMPARISON:  Portable exam 1808 hours compared to 06/29/2021 FINDINGS: Rotated to the RIGHT. Enlargement of cardiac silhouette. Mediastinal contours and pulmonary vascularity normal. Lungs clear. No pulmonary infiltrate, pleural effusion, or pneumothorax. Bones demineralized. IMPRESSION: No acute abnormalities. Enlargement of cardiac silhouette. Electronically Signed   By: Lavonia Dana M.D.   On: 02/06/2022 18:19    Procedures Procedures    Medications Ordered in ED Medications - No data to display  ED Course/ Medical Decision Making/ A&P                           Medical Decision Making Amount and/or Complexity of Data Reviewed Labs: ordered. Radiology: ordered.  Risk OTC drugs. Prescription drug management. Decision regarding hospitalization.   Iv ns. Continuous pulse ox and cardiac monitoring. Stat labs. Imaging.   Reviewed nursing notes and prior charts for additional history. External reports reviewed.   Labs reviewed/interpreted by me - initial trop c/w priors.   CXR reviewed/interpreted by me - no pna.   Cardiac monitor - sinus rhythm, rate 88.  Additional labs reviewed/interpreted by me - delta trop mildly increased from initial - given this, hx cad, recommend admission.  Ecasa po.  Pt requests pain med, states takes pain meds at home for back pain/chronic. Percocet 1 po.  Delta tro sl higher. No chest pain on recheck.   Hospitalists consulted for admission.         Final Clinical Impression(s) / ED Diagnoses Final diagnoses:  None    Rx / DC Orders ED Discharge Orders     None         Lajean Saver, MD 02/10/22 1510

## 2022-02-07 ENCOUNTER — Other Ambulatory Visit: Payer: Self-pay

## 2022-02-07 ENCOUNTER — Observation Stay (HOSPITAL_BASED_OUTPATIENT_CLINIC_OR_DEPARTMENT_OTHER): Payer: Medicare Other

## 2022-02-07 ENCOUNTER — Telehealth: Payer: Self-pay | Admitting: Cardiology

## 2022-02-07 DIAGNOSIS — R072 Precordial pain: Secondary | ICD-10-CM | POA: Diagnosis not present

## 2022-02-07 DIAGNOSIS — E669 Obesity, unspecified: Secondary | ICD-10-CM

## 2022-02-07 DIAGNOSIS — R079 Chest pain, unspecified: Secondary | ICD-10-CM

## 2022-02-07 DIAGNOSIS — K219 Gastro-esophageal reflux disease without esophagitis: Secondary | ICD-10-CM | POA: Diagnosis not present

## 2022-02-07 DIAGNOSIS — Z9861 Coronary angioplasty status: Secondary | ICD-10-CM

## 2022-02-07 DIAGNOSIS — I10 Essential (primary) hypertension: Secondary | ICD-10-CM | POA: Diagnosis not present

## 2022-02-07 DIAGNOSIS — I2510 Atherosclerotic heart disease of native coronary artery without angina pectoris: Secondary | ICD-10-CM | POA: Diagnosis not present

## 2022-02-07 DIAGNOSIS — N183 Chronic kidney disease, stage 3 unspecified: Secondary | ICD-10-CM

## 2022-02-07 DIAGNOSIS — E785 Hyperlipidemia, unspecified: Secondary | ICD-10-CM

## 2022-02-07 DIAGNOSIS — E1169 Type 2 diabetes mellitus with other specified complication: Secondary | ICD-10-CM

## 2022-02-07 LAB — COMPREHENSIVE METABOLIC PANEL
ALT: 15 U/L (ref 0–44)
AST: 19 U/L (ref 15–41)
Albumin: 3.9 g/dL (ref 3.5–5.0)
Alkaline Phosphatase: 64 U/L (ref 38–126)
Anion gap: 8 (ref 5–15)
BUN: 70 mg/dL — ABNORMAL HIGH (ref 8–23)
CO2: 21 mmol/L — ABNORMAL LOW (ref 22–32)
Calcium: 9 mg/dL (ref 8.9–10.3)
Chloride: 111 mmol/L (ref 98–111)
Creatinine, Ser: 2.58 mg/dL — ABNORMAL HIGH (ref 0.61–1.24)
GFR, Estimated: 25 mL/min — ABNORMAL LOW (ref >60)
Glucose, Bld: 158 mg/dL — ABNORMAL HIGH (ref 70–99)
Potassium: 3.7 mmol/L (ref 3.5–5.1)
Sodium: 140 mmol/L (ref 135–145)
Total Bilirubin: 0.2 mg/dL — ABNORMAL LOW (ref 0.3–1.2)
Total Protein: 7.1 g/dL (ref 6.5–8.1)

## 2022-02-07 LAB — ECHOCARDIOGRAM COMPLETE
AR max vel: 2.23 cm2
AV Area VTI: 2.05 cm2
AV Area mean vel: 2.23 cm2
AV Mean grad: 4 mmHg
AV Peak grad: 6.7 mmHg
Ao pk vel: 1.29 m/s
Area-P 1/2: 3.61 cm2
Calc EF: 55.5 %
Height: 74 in
MV VTI: 1.83 cm2
S' Lateral: 4 cm
Single Plane A2C EF: 59.7 %
Single Plane A4C EF: 54.2 %
Weight: 4240 oz

## 2022-02-07 LAB — CBC
HCT: 30.2 % — ABNORMAL LOW (ref 39.0–52.0)
Hemoglobin: 9.8 g/dL — ABNORMAL LOW (ref 13.0–17.0)
MCH: 32.6 pg (ref 26.0–34.0)
MCHC: 32.5 g/dL (ref 30.0–36.0)
MCV: 100.3 fL — ABNORMAL HIGH (ref 80.0–100.0)
Platelets: 177 10*3/uL (ref 150–400)
RBC: 3.01 MIL/uL — ABNORMAL LOW (ref 4.22–5.81)
RDW: 14.7 % (ref 11.5–15.5)
WBC: 6.3 10*3/uL (ref 4.0–10.5)
nRBC: 0 % (ref 0.0–0.2)

## 2022-02-07 LAB — RESP PANEL BY RT-PCR (FLU A&B, COVID) ARPGX2
Influenza A by PCR: NEGATIVE
Influenza B by PCR: NEGATIVE
SARS Coronavirus 2 by RT PCR: NEGATIVE

## 2022-02-07 LAB — CBG MONITORING, ED
Glucose-Capillary: 92 mg/dL (ref 70–99)
Glucose-Capillary: 98 mg/dL (ref 70–99)

## 2022-02-07 LAB — HEMOGLOBIN A1C
Hgb A1c MFr Bld: 5.8 % — ABNORMAL HIGH (ref 4.8–5.6)
Mean Plasma Glucose: 119.76 mg/dL

## 2022-02-07 LAB — TROPONIN I (HIGH SENSITIVITY)
Troponin I (High Sensitivity): 49 ng/L — ABNORMAL HIGH (ref <18)
Troponin I (High Sensitivity): 55 ng/L — ABNORMAL HIGH (ref <18)

## 2022-02-07 MED ORDER — NITROGLYCERIN 0.4 MG SL SUBL: 0.4000 mg | SUBLINGUAL_TABLET | SUBLINGUAL | 3 refills | Status: DC | PRN

## 2022-02-07 MED ORDER — FUROSEMIDE 40 MG PO TABS: 40.0000 mg | ORAL_TABLET | Freq: Every day | ORAL | Status: DC

## 2022-02-07 NOTE — Telephone Encounter (Signed)
Patient c/o Palpitations:  High priority if patient c/o lightheadedness, shortness of breath, or chest pain  How long have you had palpitations/irregular HR/ Afib? Are you having the symptoms now?  Patient states he went into afib yesterday and went to the ED. He has been discharged and he is feeling better. Has been advised to follow up with cardiology.  Are you currently experiencing lightheadedness, SOB or CP?  No  Do you have a history of afib (atrial fibrillation) or irregular heart rhythm?  Yes, hx of afib  Have you checked your BP or HR? (document readings if available):  No readings available  Are you experiencing any other symptoms?  No

## 2022-02-07 NOTE — Assessment & Plan Note (Signed)
Patient presents with sudden onset chest pain while doing a moderate level activity Troponin initially elevated 24, 41 but is back to downtrending at 40 No acute ST changes on EKG Patient was given aspirin in the ED Currently chest pain-free Monitor on telemetry Echo in the a.m. Continue aspirin, ARB, statin Likely to be discharged later this afternoon

## 2022-02-07 NOTE — ED Notes (Signed)
Attempted to start IV twice but was unsuccessful. 4 nurses have tried on previous shift, including using ultrasound with no success.

## 2022-02-07 NOTE — ED Notes (Signed)
Dr [name] at bedside to discuss discharge instructions.

## 2022-02-07 NOTE — H&P (Signed)
History and Physical    Patient: [name] UXY:[id number] DOB: 03-01-1946 DOA: 02/06/2022 DOS: the patient was seen and examined on 02/07/2022 PCP: Redmond School, MD  Patient coming from: Home  Chief Complaint:  Chief Complaint  Patient presents with   Chest Pain    HPI: JAKOBE BLAU is a 76 y.o. male with medical history significant of with history of anxiety, BPH, coronary artery disease, CKD stage III, diabetes mellitus type 2, diastolic heart failure, GERD, hyperlipidemia, hypertension, and more presents ED with chief complaint of chest pain.  Patient reports that he had sudden onset of chest pain around 2:30 PM.  It was constant, and felt like a tightness across his chest.  Patient reports that he was unloading groceries when this happened.  He had no associated shortness of breath, diaphoresis, nausea, or lightheadedness.  He did admit to palpitations.  He reports that his nephrologist started him on a new medication 2 weeks ago, and he is concerned that this is in relation to that.  He cannot remember the name of the medication, but thinks it might be hydralazine.  Patient reports that in the past when he had his heart attack, he had radiation of his pain to his left arm.  Today he has no radiation of his pain to the left arm, but he does have radiation of his pain up into his bilateral neck.  Patient did not take aspirin or nitro prior to coming into the hospital.  He also has a feeling of indigestion.  Patient reports a history of GERD and does take an acid blocker at home.  Patient's only other complaint is that the room is cold, which she is quite fixated on.  Plenty of blankets have been offered.  Patient has no other complaints at this time.  Patient does not smoke, does not drink alcohol, does not use illicit drugs.  He is vaccinated for COVID.  Patient is full code.  Review of Systems: As mentioned in the history of present illness. All other systems reviewed and are  negative. Past Medical History:  Diagnosis Date   Anxiety    Benign prostatic hypertrophy    Nocturia   CAD (coronary artery disease)    a.  NSTEMI 10/2012 s/p DES to LAD & DES to 1st diagonal with residual diffuse nonobstructive dz in LCx/RCA    Chronic kidney disease, stage 3, mod decreased GFR (HCC)    Creatinine of 1.5 in 03/2009, STAGE 4 now   Degenerative joint disease    Depression    hx of   Diabetes mellitus, type II ([place]) 12/10/2012.   Diastolic heart failure    Pedal edema, "a long time ago"   Erectile dysfunction    Flu 2013   hx of   GERD (gastroesophageal reflux disease)    Gout    Hemorrhoids    High cholesterol    Hypertension    Exercise induced   Myocardial infarction ([place])    NSVT (nonsustained ventricular tachycardia)    Exercise induced   Obesity    Tobacco abuse, 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, quit 1994   Tubular adenoma 2014   Past Surgical History:  Procedure Laterality Date   BACK SURGERY  2007   x2   BIOPSY  02/05/2021   Procedure: BIOPSY;  Surgeon: Daneil Dolin, MD;  Location: [facility];  Service: Endoscopy;;   CARDIAC CATHETERIZATION     with stent placement   COLONOSCOPY WITH ESOPHAGOGASTRODUODENOSCOPY (  EGD) N/A 11/23/2013   Dr. Oneida Alar- TCS= left sided colitis and mild proctitis likely due to ischemic colitis, moderate internal hemorrhoids, small nodule in the rectum, bx=tubular adenoma, EGD=-mild non-erosive gastritis   COLONOSCOPY WITH PROPOFOL N/A 07/24/2020   hemorrhoids,  rectosigmoid lipoma, small adenoma removed.   ESOPHAGOGASTRODUODENOSCOPY (EGD) WITH PROPOFOL N/A 02/05/2021   Normal esophagus, erythematous mucosa in entire stomach, no obvious ulcer, s/p biopsy. Normal duodenum. Negative H.pylori.    HERNIA REPAIR     LEFT HEART CATHETERIZATION WITH CORONARY ANGIOGRAM N/A 11/16/2012   Procedure: LEFT HEART CATHETERIZATION WITH CORONARY ANGIOGRAM;  Surgeon: Sherren Mocha, MD;  Location: [facility] CATH LAB;  Service: Cardiovascular;   Laterality: N/A;   PERCUTANEOUS CORONARY STENT INTERVENTION (PCI-S)  11/16/2012   Procedure: PERCUTANEOUS CORONARY STENT INTERVENTION (PCI-S);  Surgeon: Sherren Mocha, MD;  Location: [facility] CATH LAB;  Service: Cardiovascular;;   POLYPECTOMY  07/24/2020   Procedure: POLYPECTOMY;  Surgeon: Daneil Dolin, MD;  Location: [facility];  Service: Endoscopy;;   TOTAL KNEE ARTHROPLASTY Right 11/15/2013   Procedure: RIGHT TOTAL KNEE ARTHROPLASTY;  Surgeon: Mauri Pole, MD;  Location: [facility];  Service: Orthopedics;  Laterality: Right;   TOTAL KNEE ARTHROPLASTY Left 03/07/2015   Procedure: LEFT TOTAL KNEE ARTHROPLASTY;  Surgeon: Paralee Cancel, MD;  Location: [facility];  Service: Orthopedics;  Laterality: Left;   Social History:  reports that he quit smoking about 29 years ago. His smoking use included cigarettes. He started smoking about 57 years ago. He has a 30.00 pack-year smoking history. He has never used smokeless tobacco. He reports that he does not drink alcohol and does not use drugs.  Allergies  Allergen Reactions   Metformin    Niacin    Niacin And Related Other (See Comments)    Reaction: flushing and burning side effects even with extended release    Family History  Problem Relation Age of Onset   Hypertension Other    Cancer Other    Heart disease Other    Diabetes Other    Hypertension Mother    Cancer Father        brain   CAD Brother    Colon cancer Neg Hx     Prior to Admission medications   Medication Sig Start Date End Date Taking? Authorizing Provider  acarbose (PRECOSE) 50 MG tablet Take 50 mg by mouth in the morning, at noon, and at bedtime.  10/20/17  Yes [provider]  acetaminophen (TYLENOL) 650 MG CR tablet Take 1,300 mg by mouth every 8 (eight) hours as needed for pain.   Yes [provider]  allopurinol (ZYLOPRIM) 100 MG tablet Take 100-150 mg by mouth 2 (two) times daily. Takes 1.5 tablet in am and 1 tablet in evening 08/17/20  Yes [provider]  amLODipine (NORVASC) 10 MG tablet Take 10 mg by mouth every evening.    Yes [provider]  aspirin EC 81 MG tablet Take 1 tablet (81 mg total) by mouth daily. 09/14/19  Yes Herminio Commons, MD  calcitRIOL (ROCALTROL) 0.25 MCG capsule Take 0.25 mcg by mouth every Sunday.  12/13/16  Yes [provider]  cholecalciferol (VITAMIN D) 1000 units tablet Take 1,000 Units daily by mouth.   Yes [provider]  cloNIDine (CATAPRES) 0.1 MG tablet Take 0.1 mg by mouth 3 (three) times daily. 02/15/20  Yes [provider]  dicyclomine (BENTYL) 10 MG capsule Take 10 mg by mouth 3 (three) times daily.   Yes [provider]  furosemide (LASIX) 40 MG tablet Take 1 tablet (40 mg total) by mouth 2 (two) times daily. Patient taking differently: Take 40 mg by mouth daily. 08/09/19  Yes Herminio Commons, MD  gabapentin (NEURONTIN) 300 MG capsule Take 300 mg by mouth 3 (three) times daily.  10/19/19  Yes [provider]  hydrALAZINE (APRESOLINE) 50 MG tablet Take 50 mg by mouth 3 (three) times daily. 01/24/22  Yes [provider]  HYDROcodone-acetaminophen (NORCO) 10-325 MG tablet Take 1-2 tablets by mouth 4 (four) times daily as needed for moderate pain or severe pain. 10/28/19  Yes Shah, Pratik D, DO  insulin NPH Human (NOVOLIN N) 100 UNIT/ML injection Inject 0.18 mLs (18 Units total) into the skin every morning. Ands syringes 1/day 06/27/21  Yes Renato Shin, MD  losartan (COZAAR) 25 MG tablet Take 25 mg by mouth daily.   Yes [provider]  meclizine (ANTIVERT) 25 MG tablet Take 25 mg by mouth 3 (three) times daily as needed for dizziness.   Yes [provider]  nitroGLYCERIN (NITROSTAT) 0.4 MG SL tablet PLACE ONE TABLET UNDER THE TONGUE EVERY 5 MINUTES FOR 3 DOSES AS NEEDED Patient taking differently: Place 0.4 mg under the tongue every 5 (five) minutes as needed for chest pain. 07/22/17  Yes Herminio Commons, MD   ondansetron (ZOFRAN) 4 MG tablet TAKE ONE TABLET (4MG TOTAL) BY MOUTH EVERY 8 HOURS AS NEEDED FOR NAUSEA OR VOMITING 04/24/21  Yes Annitta Needs, NP  pantoprazole (PROTONIX) 40 MG tablet TAKE ONE TABLET (40MG TOTAL) BY MOUTH TWO TIMES DAILY BEFORE A MEAL 05/11/21  Yes Aliene Altes S, PA-C  pravastatin (PRAVACHOL) 40 MG tablet Take 1 tablet (40 mg total) by mouth every evening. 07/31/21 02/06/22 Yes Imogene Burn, PA-C  valsartan (DIOVAN) 40 MG tablet Take 40 mg by mouth daily. 11/20/21  Yes [provider]  lubiprostone (AMITIZA) 8 MCG capsule Take by mouth.    [provider]  Sod Picosulfate-Mag Ox-Cit Acd (CLENPIQ) 10-3.5-12 MG-GM -GM/160ML SOLN Take 1 kit by mouth as directed. Patient not taking: Reported on 02/06/2022 08/08/21   Daneil Dolin, MD  TRUE METRIX BLOOD GLUCOSE TEST test strip USE TO TEST BLOOD SUGAR UP TO TWICE DAILY OR AS DIRECTED 08/31/21   Renato Shin, MD    Physical Exam: Vitals:   02/06/22 2330 02/07/22 0000 02/07/22 0030 02/07/22 0435  BP: 123/65 119/61 140/64 (!) 127/45  Pulse: (!) 52 (!) 52 (!) 50 65  Resp: _0 Temp:      TempSrc:      SpO2: 100% 98% 100% 95%  Weight:      Height:       1.  General: Patient lying supine in bed,  no acute distress   2. Psychiatric: Alert and oriented x 3, mood and behavior normal for situation, pleasant and cooperative with exam   3. Neurologic: Speech and language are normal, face is symmetric, moves all 4 extremities voluntarily, at baseline without acute deficits on limited exam   4. HEENMT:  Head is atraumatic, normocephalic, pupils reactive to light, neck is supple, trachea is midline, mucous membranes are moist   5. Respiratory : Lungs are clear to auscultation bilaterally without wheezing, rhonchi, rales, no cyanosis, no increase in work of breathing or accessory muscle use   6. Cardiovascular : Heart rate normal, rhythm is regular, no murmurs, rubs or gallops, 2+ pitting edema,  peripheral pulses palpated   7. Gastrointestinal:  Abdomen  is soft, nondistended, nontender to palpation bowel sounds active, no masses or organomegaly palpated   8. Skin:  Venous stasis changes of the skin in the bilateral lower extremities   9.Musculoskeletal:  No acute deformities or trauma, no asymmetry in tone, 2+ pitting edema, peripheral pulses palpated, no tenderness to palpation in the extremities   Data Reviewed: In the ED Temp 97.7, heart rate 58-94, respiratory rate 13-18, blood pressure 151/88, satting at 99-100% on room air No leukocytosis, hemoglobin stable at 10.0, platelets 177 Chemistry panel reveals a elevated BUN at 73, creatinine 2.8-very close to patient's baseline.  GFR is 23 Troponin 24, 41, 40 Chest x-ray shows no acute abnormality EKG shows a first-degree heart block with a rate of 91, sinus rhythm, QTc 469 Patient was given aspirin and Norco in the ED.  He reported taking no aspirin at home, but does have a prescription for 81 mg aspirin daily. Admission requested for chest pain of  Assessment and Plan: * Chest pain- (present on admission) Patient presents with sudden onset chest pain while doing a moderate level activity Troponin initially elevated 24, 41 but is back to downtrending at 40 No acute ST changes on EKG Patient was given aspirin in the ED Currently chest pain-free Monitor on telemetry Echo in the a.m. Continue aspirin, ARB, statin Likely to be discharged later this afternoon  GERD (gastroesophageal reflux disease)- (present on admission) Continue Protonix  HLD (hyperlipidemia)- (present on admission) Continue pravastatin  Diabetes mellitus type 2 in obese ([place])- (present on admission) Continue reduced dose of basal insulin Continue sliding scale coverage Carb modified diet Monitor CBGs  CAD S/P percutaneous coronary angioplasty Patient reports a remote history of stent, per chart review 2013 Presents today for chest pain see  that assessment for further details Continue pravastatin, losartan, aspirin EKG is without acute ST changes Troponin initially elevated from 24, 41 but is back to downtrending at 40 Echo in the a.m. Monitor on telemetry  Essential hypertension- (present on admission) Continue Norvasc, clonidine, hydralazine, losartan Blood pressure in the ED 151/88 Continue to monitor on telemetry       Advance Care Planning:   Code Status: Full Code   Consults: None  Family Communication: No family at bedside  Severity of Illness: The appropriate patient status for this patient is OBSERVATION. Observation status is judged to be reasonable and necessary in order to provide the required intensity of service to ensure the patient's safety. The patient's presenting symptoms, physical exam findings, and initial radiographic and laboratory data in the context of their medical condition is felt to place them at decreased risk for further clinical deterioration. Furthermore, it is anticipated that the patient will be medically stable for discharge from the hospital within 2 midnights of admission.   Author: Rolla Plate, DO 02/07/2022 6:12 AM  For on call review [email or web address].

## 2022-02-07 NOTE — Assessment & Plan Note (Signed)
Patient reports a remote history of stent, per chart review 2013 Presents today for chest pain see that assessment for further details Continue pravastatin, losartan, aspirin EKG is without acute ST changes Troponin initially elevated from 24, 41 but is back to downtrending at 40 Echo in the a.m. Monitor on telemetry

## 2022-02-07 NOTE — Progress Notes (Signed)
*  PRELIMINARY RESULTS* Echocardiogram 2D Echocardiogram has been performed.  [name] 02/07/2022, 9:31 AM

## 2022-02-07 NOTE — Progress Notes (Signed)
°  Transition of Care [facility]) Screening Note   Patient Details  Name: GAIUS ISHAQ Date of Birth: 07/03/1946   [facility] of [facility]) CM/SW Contact:    Boneta Lucks, RN Phone Number: 02/07/2022, 11:05 AM    Transition of Care Department [facility]) has reviewed patient and no TOC needs have been identified at this time. We will continue to monitor patient advancement through interdisciplinary progression rounds. If new patient transition needs arise, please place a TOC consult.

## 2022-02-07 NOTE — Assessment & Plan Note (Signed)
Continue Norvasc, clonidine, hydralazine, losartan Blood pressure in the ED 151/88 Continue to monitor on telemetry

## 2022-02-07 NOTE — ED Notes (Signed)
Pt getting impatient and states he is ready to go home. Explained we are waiting on labs to return. Pt complained of back hurting, asked to sit in chair in room to help with back pain.

## 2022-02-07 NOTE — Discharge Instructions (Signed)
IMPORTANT INFORMATION: PAY CLOSE ATTENTION   PHYSICIAN DISCHARGE INSTRUCTIONS  Follow with Primary care provider  Redmond School, MD  and other consultants as instructed by your Hospitalist Physician  [place] IF SYMPTOMS COME BACK, WORSEN OR NEW PROBLEM DEVELOPS   Please note: You were cared for by a hospitalist during your hospital stay. Every effort will be made to forward records to your primary care provider.  You can request that your primary care provider send for your hospital records if they have not received them.  Once you are discharged, your primary care physician will handle any further medical issues. Please note that NO REFILLS for any discharge medications will be authorized once you are discharged, as it is imperative that you return to your primary care physician (or establish a relationship with a primary care physician if you do not have one) for your post hospital discharge needs so that they can reassess your need for medications and monitor your lab values.  Please get a complete blood count and chemistry panel checked by your Primary MD at your next visit, and again as instructed by your Primary MD.  Get Medicines reviewed and adjusted: Please take all your medications with you for your next visit with your Primary MD  Laboratory/radiological data: Please request your Primary MD to go over all hospital tests and procedure/radiological results at the follow up, please ask your primary care provider to get all Hospital records sent to his/her office.  In some cases, they will be blood work, cultures and biopsy results pending at the time of your discharge. Please request that your primary care provider follow up on these results.  If you are diabetic, please bring your blood sugar readings with you to your follow up appointment with primary care.    Please call and make your follow up appointments as soon as possible.    Also Note  the following: If you experience worsening of your admission symptoms, develop shortness of breath, life threatening emergency, suicidal or homicidal thoughts you must seek medical attention immediately by calling 911 or calling your MD immediately  if symptoms less severe.  You must read complete instructions/literature along with all the possible adverse reactions/side effects for all the Medicines you take and that have been prescribed to you. Take any new Medicines after you have completely understood and accpet all the possible adverse reactions/side effects.   Do not drive when taking Pain medications or sleeping medications (Benzodiazepines)  Do not take more than prescribed Pain, Sleep and Anxiety Medications. It is not advisable to combine anxiety,sleep and pain medications without talking with your primary care practitioner  Special Instructions: If you have smoked or chewed Tobacco  in the last 2 yrs please stop smoking, stop any regular Alcohol  and or any Recreational drug use.  Wear Seat belts while driving.  Do not drive if taking any narcotic, mind altering or controlled substances or recreational drugs or alcohol.

## 2022-02-07 NOTE — ED Notes (Signed)
Echo in progress at bedside.

## 2022-02-07 NOTE — Assessment & Plan Note (Signed)
Continue pravastatin 

## 2022-02-07 NOTE — Telephone Encounter (Signed)
Pt would like to have a prescription for Nitro 0.4 mg SL tablets called in. Medication is not on his list. Please advise if this is okay.

## 2022-02-07 NOTE — Telephone Encounter (Signed)
Left a message for pt to call office back. Pt has appt with Dr. Harl Bowie in the [facility] on 02/08/22.

## 2022-02-07 NOTE — Telephone Encounter (Signed)
Patient returning call.

## 2022-02-07 NOTE — ED Notes (Signed)
Pt given extra blankets and pillow. Repositioned and call light within reach.

## 2022-02-07 NOTE — Discharge Summary (Signed)
Physician Discharge Summary  [name] OHY:[id number] DOB: 24-Feb-1946 DOA: 02/06/2022  PCP: Redmond School, MD Cardiologist: Dr. Harl Bowie  Admit date: 02/06/2022 Discharge date: 02/07/2022  Admitted From:  Home  Disposition: Home   Recommendations for Outpatient Follow-up:  Follow up with PCP in 1 weeks Follow up with Dr. Harl Bowie in 1-2 weeks for recheck Follow up with Dr. Theador Hawthorne in 2 weeks  Discharge Condition: STABLE   CODE STATUS: FULL DIET: renal/heart healthy   Brief Hospitalization Summary: Please see all hospital notes, images, labs for full details of the hospitalization. ADMISSION HPI: [name] is a 76 y.o. male with medical history significant of with history of anxiety, BPH, coronary artery disease, CKD stage III, diabetes mellitus type 2, diastolic heart failure, GERD, hyperlipidemia, hypertension, and more presents ED with chief complaint of chest pain.  Patient reports that he had sudden onset of chest pain around 2:30 PM.  It was constant, and felt like a tightness across his chest.  Patient reports that he was unloading groceries when this happened.  He had no associated shortness of breath, diaphoresis, nausea, or lightheadedness.  He did admit to palpitations.  He reports that his nephrologist started him on a new medication 2 weeks ago, and he is concerned that this is in relation to that.  He cannot remember the name of the medication, but thinks it might be hydralazine.  Patient reports that in the past when he had his heart attack, he had radiation of his pain to his left arm.  Today he has no radiation of his pain to the left arm, but he does have radiation of his pain up into his bilateral neck.  Patient did not take aspirin or nitro prior to coming into the hospital.  He also has a feeling of indigestion.  Patient reports a history of GERD and does take an acid blocker at home.  Patient's only other complaint is that the room is cold, which she is quite fixated  on.  Plenty of blankets have been offered.  Patient has no other complaints at this time.   Patient does not smoke, does not drink alcohol, does not use illicit drugs.  He is vaccinated for COVID.  Patient is full code.  HOSPITAL COURSE Pt was admitted for atypical chest pain symptoms.  He had a reassuring EKG and high-sensitivity troponin tests which were negative for acute cardiac ischemia.  We also did a 2D echocardiogram noted below with no findings of regional wall motion abnormalities grade 1 diastolic dysfunction EF unchanged from previous testing.  He is feeling much better and will discharge home have him follow-up with cardiology outpatient in 1 to 2 weeks.  Follow-up with his nephrologist in 2 weeks.  His medications will remain unchanged.  Nitroglycerin from home to be continued as needed for symptoms.  Continue pravastatin 40 mg daily.  Continue other blood pressure lowering medications.  Patient is stable to discharge from ED to home.  Discharge Diagnoses:  Principal Problem:   Chest pain Active Problems:   Essential hypertension   Chronic kidney disease, stage 3, cardiorenal syndrome   CAD S/P percutaneous coronary angioplasty   Diabetes mellitus type 2 in obese (HCC)   HLD (hyperlipidemia)   GERD (gastroesophageal reflux disease)   Discharge Instructions: Discharge Instructions     Ambulatory referral to Cardiology   Complete by: As directed    Hospital follow up chest pain      Allergies as of 02/07/2022  Reactions   Metformin    Niacin    Niacin And Related Other (See Comments)   Reaction: flushing and burning side effects even with extended release        Medication List     STOP taking these medications    Clenpiq 10-3.5-12 MG-GM -GM/160ML Soln Generic drug: Sod Picosulfate-Mag Ox-Cit Acd       TAKE these medications    acarbose 50 MG tablet Commonly known as: PRECOSE Take 50 mg by mouth in the morning, at noon, and at bedtime.    acetaminophen 650 MG CR tablet Commonly known as: TYLENOL Take 1,300 mg by mouth every 8 (eight) hours as needed for pain.   allopurinol 100 MG tablet Commonly known as: ZYLOPRIM Take 100-150 mg by mouth 2 (two) times daily. Takes 1.5 tablet in am and 1 tablet in evening   amLODipine 10 MG tablet Commonly known as: NORVASC Take 10 mg by mouth every evening.   aspirin EC 81 MG tablet Take 1 tablet (81 mg total) by mouth daily.   calcitRIOL 0.25 MCG capsule Commonly known as: ROCALTROL Take 0.25 mcg by mouth every Sunday.   cholecalciferol 1000 units tablet Commonly known as: VITAMIN D Take 1,000 Units daily by mouth.   cloNIDine 0.1 MG tablet Commonly known as: CATAPRES Take 0.1 mg by mouth 3 (three) times daily.   dicyclomine 10 MG capsule Commonly known as: BENTYL Take 10 mg by mouth 3 (three) times daily.   furosemide 40 MG tablet Commonly known as: LASIX Take 1 tablet (40 mg total) by mouth daily.   gabapentin 300 MG capsule Commonly known as: NEURONTIN Take 300 mg by mouth 3 (three) times daily.   hydrALAZINE 50 MG tablet Commonly known as: APRESOLINE Take 50 mg by mouth 3 (three) times daily.   HYDROcodone-acetaminophen 10-325 MG tablet Commonly known as: NORCO Take 1-2 tablets by mouth 4 (four) times daily as needed for moderate pain or severe pain.   insulin NPH Human 100 UNIT/ML injection Commonly known as: NovoLIN N Inject 0.18 mLs (18 Units total) into the skin every morning. Ands syringes 1/day   losartan 25 MG tablet Commonly known as: COZAAR Take 25 mg by mouth daily.   lubiprostone 8 MCG capsule Commonly known as: AMITIZA Take by mouth.   meclizine 25 MG tablet Commonly known as: ANTIVERT Take 25 mg by mouth 3 (three) times daily as needed for dizziness.   nitroGLYCERIN 0.4 MG SL tablet Commonly known as: NITROSTAT PLACE ONE TABLET UNDER THE TONGUE EVERY 5 MINUTES FOR 3 DOSES AS NEEDED What changed: See the new instructions.    ondansetron 4 MG tablet Commonly known as: ZOFRAN TAKE ONE TABLET (4MG TOTAL) BY MOUTH EVERY 8 HOURS AS NEEDED FOR NAUSEA OR VOMITING   pantoprazole 40 MG tablet Commonly known as: PROTONIX TAKE ONE TABLET (40MG TOTAL) BY MOUTH TWO TIMES DAILY BEFORE A MEAL   pravastatin 40 MG tablet Commonly known as: PRAVACHOL Take 1 tablet (40 mg total) by mouth every evening.   True Metrix Blood Glucose Test test strip Generic drug: glucose blood USE TO TEST BLOOD SUGAR UP TO TWICE DAILY OR AS DIRECTED   valsartan 40 MG tablet Commonly known as: DIOVAN Take 40 mg by mouth daily.        Follow-up Information     Arnoldo Lenis, MD Follow up in 1 week(s).   Specialty: Cardiology Why: Hospital Follow Up Contact information: [address] [place] [address] [phone]  Liana Gerold, MD. Schedule an appointment as soon as possible for a visit in 1 week(s).   Specialty: Nephrology Why: Hospital Follow Up Contact information: [address] [place] [address] [phone]                Allergies  Allergen Reactions   Metformin    Niacin    Niacin And Related Other (See Comments)    Reaction: flushing and burning side effects even with extended release   Allergies as of 02/07/2022       Reactions   Metformin    Niacin    Niacin And Related Other (See Comments)   Reaction: flushing and burning side effects even with extended release        Medication List     STOP taking these medications    Clenpiq 10-3.5-12 MG-GM -GM/160ML Soln Generic drug: Sod Picosulfate-Mag Ox-Cit Acd       TAKE these medications    acarbose 50 MG tablet Commonly known as: PRECOSE Take 50 mg by mouth in the morning, at noon, and at bedtime.   acetaminophen 650 MG CR tablet Commonly known as: TYLENOL Take 1,300 mg by mouth every 8 (eight) hours as needed for pain.   allopurinol 100 MG tablet Commonly known as: ZYLOPRIM Take 100-150 mg  by mouth 2 (two) times daily. Takes 1.5 tablet in am and 1 tablet in evening   amLODipine 10 MG tablet Commonly known as: NORVASC Take 10 mg by mouth every evening.   aspirin EC 81 MG tablet Take 1 tablet (81 mg total) by mouth daily.   calcitRIOL 0.25 MCG capsule Commonly known as: ROCALTROL Take 0.25 mcg by mouth every Sunday.   cholecalciferol 1000 units tablet Commonly known as: VITAMIN D Take 1,000 Units daily by mouth.   cloNIDine 0.1 MG tablet Commonly known as: CATAPRES Take 0.1 mg by mouth 3 (three) times daily.   dicyclomine 10 MG capsule Commonly known as: BENTYL Take 10 mg by mouth 3 (three) times daily.   furosemide 40 MG tablet Commonly known as: LASIX Take 1 tablet (40 mg total) by mouth daily.   gabapentin 300 MG capsule Commonly known as: NEURONTIN Take 300 mg by mouth 3 (three) times daily.   hydrALAZINE 50 MG tablet Commonly known as: APRESOLINE Take 50 mg by mouth 3 (three) times daily.   HYDROcodone-acetaminophen 10-325 MG tablet Commonly known as: NORCO Take 1-2 tablets by mouth 4 (four) times daily as needed for moderate pain or severe pain.   insulin NPH Human 100 UNIT/ML injection Commonly known as: NovoLIN N Inject 0.18 mLs (18 Units total) into the skin every morning. Ands syringes 1/day   losartan 25 MG tablet Commonly known as: COZAAR Take 25 mg by mouth daily.   lubiprostone 8 MCG capsule Commonly known as: AMITIZA Take by mouth.   meclizine 25 MG tablet Commonly known as: ANTIVERT Take 25 mg by mouth 3 (three) times daily as needed for dizziness.   nitroGLYCERIN 0.4 MG SL tablet Commonly known as: NITROSTAT PLACE ONE TABLET UNDER THE TONGUE EVERY 5 MINUTES FOR 3 DOSES AS NEEDED What changed: See the new instructions.   ondansetron 4 MG tablet Commonly known as: ZOFRAN TAKE ONE TABLET (4MG TOTAL) BY MOUTH EVERY 8 HOURS AS NEEDED FOR NAUSEA OR VOMITING   pantoprazole 40 MG tablet Commonly known as: PROTONIX TAKE ONE  TABLET (40MG TOTAL) BY MOUTH TWO TIMES DAILY BEFORE A MEAL   pravastatin 40 MG tablet Commonly known as:  PRAVACHOL Take 1 tablet (40 mg total) by mouth every evening.   True Metrix Blood Glucose Test test strip Generic drug: glucose blood USE TO TEST BLOOD SUGAR UP TO TWICE DAILY OR AS DIRECTED   valsartan 40 MG tablet Commonly known as: DIOVAN Take 40 mg by mouth daily.        Procedures/Studies: DG Chest Port 1 View  Result Date: 02/06/2022 CLINICAL DATA:  Chest pain rated at 6/10 and shortness of breath beginning today, pain is constant and sharp, history hypertension EXAM: PORTABLE CHEST 1 VIEW COMPARISON:  Portable exam 1808 hours compared to 06/29/2021 FINDINGS: Rotated to the RIGHT. Enlargement of cardiac silhouette. Mediastinal contours and pulmonary vascularity normal. Lungs clear. No pulmonary infiltrate, pleural effusion, or pneumothorax. Bones demineralized. IMPRESSION: No acute abnormalities. Enlargement of cardiac silhouette. Electronically Signed   By: Lavonia Dana M.D.   On: 02/06/2022 18:19   ECHOCARDIOGRAM COMPLETE  Result Date: 02/07/2022    ECHOCARDIOGRAM REPORT   Patient Name:   HANCEL ION Date of Exam: 02/07/2022 Medical Rec #:  [id number]       Height:       74.0 in Accession #:    [id number]      Weight:       265.0 lb Date of Birth:  10-19-1946       BSA:          2.450 m Patient Age:    14 years        BP:           129/61 mmHg Patient Gender: M               HR:           69 bpm. Exam Location:  [facility] Procedure: 2D Echo, Cardiac Doppler and Color Doppler Indications:    Chest Pain  History:        Patient has prior history of Echocardiogram examinations, most                 recent 03/28/2020. CHF, Previous Myocardial Infarction,                 Signs/Symptoms:Chest Pain; Risk Factors:Hypertension, Diabetes                 and Dyslipidemia.  Sonographer:    Wenda Low Referring Phys: [id number] ASIA B [place]  Sonographer Comments: Patient is morbidly  obese. Patient is unable to remain in position due to sig nificant back pain. IMPRESSIONS  1. Left ventricular ejection fraction, by estimation, is 60 to 65%. The left ventricle has normal function. The left ventricle has no regional wall motion abnormalities. There is moderate left ventricular hypertrophy. Left ventricular diastolic parameters are indeterminate.  2. Right ventricular systolic function is normal. The right ventricular size is normal.  3. Left atrial size was moderately dilated.  4. The mitral valve is normal in structure. No evidence of mitral valve regurgitation. No evidence of mitral stenosis.  5. The aortic valve was not well visualized. There is moderate calcification of the aortic valve. There is moderate thickening of the aortic valve. Aortic valve regurgitation is not visualized. Aortic valve sclerosis is present, with no evidence of aortic valve stenosis.  6. The inferior vena cava is normal in size with greater than 50% respiratory variability, suggesting right atrial pressure of 3 mmHg. FINDINGS  Left Ventricle: Left ventricular ejection fraction, by estimation, is 60 to 65%. The left ventricle has normal function. The left ventricle  has no regional wall motion abnormalities. The left ventricular internal cavity size was normal in size. There is  moderate left ventricular hypertrophy. Left ventricular diastolic parameters are indeterminate. Right Ventricle: The right ventricular size is normal. No increase in right ventricular wall thickness. Right ventricular systolic function is normal. Left Atrium: Left atrial size was moderately dilated. Right Atrium: Right atrial size was normal in size. Pericardium: There is no evidence of pericardial effusion. Mitral Valve: The mitral valve is normal in structure. No evidence of mitral valve regurgitation. No evidence of mitral valve stenosis. MV peak gradient, 12.4 mmHg. The mean mitral valve gradient is 4.0 mmHg. Tricuspid Valve: The tricuspid  valve is normal in structure. Tricuspid valve regurgitation is trivial. No evidence of tricuspid stenosis. Aortic Valve: The aortic valve was not well visualized. There is moderate calcification of the aortic valve. There is moderate thickening of the aortic valve. Aortic valve regurgitation is not visualized. Aortic valve sclerosis is present, with no evidence of aortic valve stenosis. Aortic valve mean gradient measures 4.0 mmHg. Aortic valve peak gradient measures 6.7 mmHg. Aortic valve area, by VTI measures 2.05 cm. Pulmonic Valve: The pulmonic valve was normal in structure. Pulmonic valve regurgitation is not visualized. No evidence of pulmonic stenosis. Aorta: The aortic root is normal in size and structure. Venous: The inferior vena cava is normal in size with greater than 50% respiratory variability, suggesting right atrial pressure of 3 mmHg. IAS/Shunts: No atrial level shunt detected by color flow Doppler.  LEFT VENTRICLE PLAX 2D LVIDd:         5.80 cm      Diastology LVIDs:         4.00 cm      LV e' medial:    13.50 cm/s LV PW:         1.70 cm      LV E/e' medial:  6.9 LV IVS:        1.60 cm      LV e' lateral:   7.18 cm/s LVOT diam:     2.00 cm      LV E/e' lateral: 12.9 LV SV:         70 LV SV Index:   28 LVOT Area:     3.14 cm  LV Volumes (MOD) LV vol d, MOD A2C: 95.1 ml LV vol d, MOD A4C: 105.0 ml LV vol s, MOD A2C: 38.3 ml LV vol s, MOD A4C: 48.1 ml LV SV MOD A2C:     56.8 ml LV SV MOD A4C:     105.0 ml LV SV MOD BP:      56.2 ml LEFT ATRIUM              Index        RIGHT ATRIUM           Index LA diam:        5.00 cm  2.04 cm/m   RA Area:     23.60 cm LA Vol (A2C):   128.0 ml 52.25 ml/m  RA Volume:   67.00 ml  27.35 ml/m LA Vol (A4C):   95.5 ml  38.99 ml/m LA Biplane Vol: 121.0 ml 49.40 ml/m  AORTIC VALVE                    PULMONIC VALVE AV Area (Vmax):    2.23 cm     PV Vmax:       0.97 m/s AV Area (Vmean):   2.23  cm     PV Peak grad:  3.7 mmHg AV Area (VTI):     2.05 cm AV Vmax:            129.00 cm/s AV Vmean:          91.900 cm/s AV VTI:            0.341 m AV Peak Grad:      6.7 mmHg AV Mean Grad:      4.0 mmHg LVOT Vmax:         91.40 cm/s LVOT Vmean:        65.200 cm/s LVOT VTI:          0.222 m LVOT/AV VTI ratio: 0.65  AORTA Ao Root diam: 3.40 cm Ao Asc diam:  3.50 cm MITRAL VALVE MV Area (PHT): 3.61 cm     SHUNTS MV Area VTI:   1.83 cm     Systemic VTI:  0.22 m MV Peak grad:  12.4 mmHg    Systemic Diam: 2.00 cm MV Mean grad:  4.0 mmHg MV Vmax:       1.76 m/s MV Vmean:      76.9 cm/s MV Decel Time: 210 msec MV E velocity: 92.50 cm/s MV A velocity: 106.00 cm/s MV E/A ratio:  0.87 Jenkins Rouge MD Electronically signed by Jenkins Rouge MD Signature Date/Time: 02/07/2022/10:54:16 AM    Final      Subjective: Pt reports that he feels better, no chest pain, no SOB.  He wants to go home.    Discharge Exam: Vitals:   02/07/22 1000 02/07/22 1030  BP: (!) 142/65 (!) 145/55  Pulse: 69 67  Resp: 14 19  Temp:    SpO2: 100% 100%   Vitals:   02/07/22 0700 02/07/22 0830 02/07/22 1000 02/07/22 1030  BP: (!) 158/61 129/61 (!) 142/65 (!) 145/55  Pulse: 69 70 69 67  Resp: 17 (!) 21 14 19   Temp:      TempSrc:      SpO2: 98% 100% 100% 100%  Weight:      Height:       General: Pt is alert, awake, not in acute distress Cardiovascular: RRR, S1/S2 +, no rubs, no gallops Respiratory: CTA bilaterally, no wheezing, no rhonchi Abdominal: Soft, NT, ND, bowel sounds + Extremities: 2+ edema, no cyanosis   The results of significant diagnostics from this hospitalization (including imaging, microbiology, ancillary and laboratory) are listed below for reference.     Microbiology: Recent Results (from the past 240 hour(s))  Resp Panel by RT-PCR (Flu A&B, Covid) Nasopharyngeal Swab     Status: None   Collection Time: 02/07/22  1:03 AM   Specimen: Nasopharyngeal Swab; Nasopharyngeal(NP) swabs in vial transport medium  Result Value Ref Range Status   SARS Coronavirus 2 by RT PCR NEGATIVE  NEGATIVE Final    Comment: (NOTE) SARS-CoV-2 target nucleic acids are NOT DETECTED.  The SARS-CoV-2 RNA is generally detectable in upper respiratory specimens during the acute phase of infection. The lowest concentration of SARS-CoV-2 viral copies this assay can detect is 138 copies/mL. A negative result does not preclude SARS-Cov-2 infection and should not be used as the sole basis for treatment or other patient management decisions. A negative result may occur with  improper specimen collection/handling, submission of specimen other than nasopharyngeal swab, presence of viral mutation(s) within the areas targeted by this assay, and inadequate number of viral copies(<138 copies/mL). A negative result must be combined with clinical observations, patient history, and epidemiological  information. The expected result is Negative.  Fact Sheet for Patients:  EntrepreneurPulse.com.au  Fact Sheet for Healthcare Providers:  IncredibleEmployment.be  This test is no t yet approved or cleared by the [place] FDA and  has been authorized for detection and/or diagnosis of SARS-CoV-2 by FDA under an Emergency Use Authorization (EUA). This EUA will remain  in effect (meaning this test can be used) for the duration of the COVID-19 declaration under Section 564(b)(1) of the Act, 21 U.S.C.section 360bbb-3(b)(1), unless the authorization is terminated  or revoked sooner.       Influenza A by PCR NEGATIVE NEGATIVE Final   Influenza B by PCR NEGATIVE NEGATIVE Final    Comment: (NOTE) The Xpert Xpress SARS-CoV-2/FLU/RSV plus assay is intended as an aid in the diagnosis of influenza from Nasopharyngeal swab specimens and should not be used as a sole basis for treatment. Nasal washings and aspirates are unacceptable for Xpert Xpress SARS-CoV-2/FLU/RSV testing.  Fact Sheet for Patients: EntrepreneurPulse.com.au  Fact Sheet for Healthcare  Providers: IncredibleEmployment.be  This test is not yet approved or cleared by the [place] FDA and has been authorized for detection and/or diagnosis of SARS-CoV-2 by FDA under an Emergency Use Authorization (EUA). This EUA will remain in effect (meaning this test can be used) for the duration of the COVID-19 declaration under Section 564(b)(1) of the Act, 21 U.S.C. section 360bbb-3(b)(1), unless the authorization is terminated or revoked.  Performed at [facility], [address] [place], [place] [address]      Labs: BNP (last 3 results) No results for input(s): BNP in the last 8760 hours. Basic Metabolic Panel: Recent Labs  Lab 02/06/22 1723 02/07/22 0316  NA 139 140  K 4.3 3.7  CL 107 111  CO2 21* 21*  GLUCOSE 113* 158*  BUN 73* 70*  CREATININE 2.81* 2.58*  CALCIUM 9.3 9.0   Liver Function Tests: Recent Labs  Lab 02/07/22 0316  AST 19  ALT 15  ALKPHOS 64  BILITOT 0.2*  PROT 7.1  ALBUMIN 3.9   No results for input(s): LIPASE, AMYLASE in the last 168 hours. No results for input(s): AMMONIA in the last 168 hours. CBC: Recent Labs  Lab 02/06/22 1723 02/07/22 0316  WBC 5.9 6.3  HGB 10.0* 9.8*  HCT 31.9* 30.2*  MCV 100.6* 100.3*  PLT 177 177   Cardiac Enzymes: No results for input(s): CKTOTAL, CKMB, CKMBINDEX, TROPONINI in the last 168 hours. BNP: Invalid input(s): POCBNP CBG: Recent Labs  Lab 02/07/22 0110 02/07/22 0756  GLUCAP 92 98   D-Dimer No results for input(s): DDIMER in the last 72 hours. Hgb A1c Recent Labs    02/07/22 0316  HGBA1C 5.8*   Lipid Profile No results for input(s): CHOL, HDL, LDLCALC, TRIG, CHOLHDL, LDLDIRECT in the last 72 hours. Thyroid function studies No results for input(s): TSH, T4TOTAL, T3FREE, THYROIDAB in the last 72 hours.  Invalid input(s): FREET3 Anemia work up No results for input(s): VITAMINB12, FOLATE, FERRITIN, TIBC, IRON, RETICCTPCT in the last 72 hours. Urinalysis     Component Value Date/Time   COLORURINE COLORLESS (A) 05/22/2021 1206   APPEARANCEUR CLEAR 05/22/2021 1206   LABSPEC 1.005 05/22/2021 1206   PHURINE 7.0 05/22/2021 [place] 05/22/2021 1206   [place] 05/22/2021 [place] 05/22/2021 [place] 05/22/2021 [place] 05/22/2021 1206   UROBILINOGEN 1.0 03/02/2015 1100   NITRITE NEGATIVE 05/22/2021 [place] 05/22/2021 1206   Sepsis  Labs Invalid input(s): PROCALCITONIN,  WBC,  LACTICIDVEN Microbiology Recent Results (from the past 240 hour(s))  Resp Panel by RT-PCR (Flu A&B, Covid) Nasopharyngeal Swab     Status: None   Collection Time: 02/07/22  1:03 AM   Specimen: Nasopharyngeal Swab; Nasopharyngeal(NP) swabs in vial transport medium  Result Value Ref Range Status   SARS Coronavirus 2 by RT PCR NEGATIVE NEGATIVE Final    Comment: (NOTE) SARS-CoV-2 target nucleic acids are NOT DETECTED.  The SARS-CoV-2 RNA is generally detectable in upper respiratory specimens during the acute phase of infection. The lowest concentration of SARS-CoV-2 viral copies this assay can detect is 138 copies/mL. A negative result does not preclude SARS-Cov-2 infection and should not be used as the sole basis for treatment or other patient management decisions. A negative result may occur with  improper specimen collection/handling, submission of specimen other than nasopharyngeal swab, presence of viral mutation(s) within the areas targeted by this assay, and inadequate number of viral copies(<138 copies/mL). A negative result must be combined with clinical observations, patient history, and epidemiological information. The expected result is Negative.  Fact Sheet for Patients:  EntrepreneurPulse.com.au  Fact Sheet for Healthcare Providers:  IncredibleEmployment.be  This test is no t yet approved or cleared by the [place]  FDA and  has been authorized for detection and/or diagnosis of SARS-CoV-2 by FDA under an Emergency Use Authorization (EUA). This EUA will remain  in effect (meaning this test can be used) for the duration of the COVID-19 declaration under Section 564(b)(1) of the Act, 21 U.S.C.section 360bbb-3(b)(1), unless the authorization is terminated  or revoked sooner.       Influenza A by PCR NEGATIVE NEGATIVE Final   Influenza B by PCR NEGATIVE NEGATIVE Final    Comment: (NOTE) The Xpert Xpress SARS-CoV-2/FLU/RSV plus assay is intended as an aid in the diagnosis of influenza from Nasopharyngeal swab specimens and should not be used as a sole basis for treatment. Nasal washings and aspirates are unacceptable for Xpert Xpress SARS-CoV-2/FLU/RSV testing.  Fact Sheet for Patients: EntrepreneurPulse.com.au  Fact Sheet for Healthcare Providers: IncredibleEmployment.be  This test is not yet approved or cleared by the [place] FDA and has been authorized for detection and/or diagnosis of SARS-CoV-2 by FDA under an Emergency Use Authorization (EUA). This EUA will remain in effect (meaning this test can be used) for the duration of the COVID-19 declaration under Section 564(b)(1) of the Act, 21 U.S.C. section 360bbb-3(b)(1), unless the authorization is terminated or revoked.  Performed at [facility] [employer] [address], [place], [place] [address]     Time coordinating discharge:   SIGNED:  Irwin Brakeman, MD  [facility] 02/07/2022, 11:05 AM How to contact the [facility] Attending or Consulting provider Long or covering provider during after hours [place], for this patient?  Check the care team in [facility] and look for a) attending/consulting [facility] provider listed and b) the [facility] team listed Log into [email or web address] and use [name]'s universal password to access. If you do not have the password, please contact the hospital operator. Locate the [facility]  provider you are looking for under [facility] and page to a number that you can be directly reached. If you still have difficulty reaching the provider, please page the [facility] (Director on Call) for the Hospitalists listed on amion for assistance.

## 2022-02-07 NOTE — ED Notes (Signed)
Attempt to call report to unit, bed 322 assigned, per Raquel Sarna pt to stay in ED and possibly discharged from ED after completed testing

## 2022-02-07 NOTE — Assessment & Plan Note (Signed)
With history of diastolic heart failure creatinine baseline between 2.5 and 3 Today creatinine is 2.58, improved from admission at 2.81 Avoid nephrotoxic agents when possible Continue to monitor

## 2022-02-07 NOTE — Assessment & Plan Note (Signed)
Continue reduced dose of basal insulin Continue sliding scale coverage Carb modified diet Monitor CBGs

## 2022-02-07 NOTE — Assessment & Plan Note (Signed)
Continue Protonix °

## 2022-02-08 ENCOUNTER — Ambulatory Visit: Payer: Medicare Other | Admitting: Cardiology

## 2022-02-08 ENCOUNTER — Other Ambulatory Visit: Payer: Self-pay

## 2022-02-08 ENCOUNTER — Encounter: Payer: Self-pay | Admitting: Cardiology

## 2022-02-08 VITALS — BP 124/70 | HR 56 | Ht 74.5 in | Wt 272.8 lb

## 2022-02-08 DIAGNOSIS — I10 Essential (primary) hypertension: Secondary | ICD-10-CM

## 2022-02-08 DIAGNOSIS — I2510 Atherosclerotic heart disease of native coronary artery without angina pectoris: Secondary | ICD-10-CM

## 2022-02-08 DIAGNOSIS — I5032 Chronic diastolic (congestive) heart failure: Secondary | ICD-10-CM | POA: Diagnosis not present

## 2022-02-08 NOTE — Progress Notes (Signed)
Clinical Summary [name] is a 76 y.o.male seen today for follow up of the following medical problems.    1. CAD NSTEMI on 11/16/2012 and received a drug-eluting stent to the LAD and D1, with residual diffuse nonobstructive disease in the RCA and LCx   - bradycardia on metoprolol, now off  - admit with chest pain 01/2022 -Trops neg. EKG without specific ischemic changes - 01/2022 echo LVEF 60-65%, no WMAs - episode happened while getting groceries. Across entire top chest, "real bad and then eased off". No other associated symptoms. Lasted 2-3 minutes.   - no recent exertional symptoms - exertion limited due to back pains.   2. HTN - compliant with meds - ARB stopped by renal due to AKi, started on hydralazine.    3. Hyperlipidemia - labs followed by pcp   4. CKD - followed by Dr Theador Hawthorne     5. OSA   6. Chronic diastolic HF - defer diuretics to nephrology   Past Medical History:  Diagnosis Date   Anxiety    Benign prostatic hypertrophy    Nocturia   CAD (coronary artery disease)    a.  NSTEMI 10/2012 s/p DES to LAD & DES to 1st diagonal with residual diffuse nonobstructive dz in LCx/RCA    Chronic kidney disease, stage 3, mod decreased GFR (HCC)    Creatinine of 1.5 in 03/2009, STAGE 4 now   Degenerative joint disease    Depression    hx of   Diabetes mellitus, type II ([place]) 12/10/2012.   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      acetaminophen (TYLENOL) 650 MG CR tablet Take 1,300 mg by mouth every 8 (eight) hours as needed for pain.     allopurinol (ZYLOPRIM) 100 MG tablet Take 100-150 mg by mouth 2 (two) times daily. Takes 1.5 tablet in am and 1 tablet in evening     amLODipine (NORVASC) 10 MG tablet Take 10 mg by mouth every evening.      aspirin EC 81 MG tablet Take 1 tablet (81 mg total) by mouth daily. 90 tablet 3   calcitRIOL (ROCALTROL) 0.25 MCG capsule Take 0.25 mcg by mouth every Sunday.      cholecalciferol (VITAMIN D) 1000 units tablet Take 1,000 Units daily by mouth.     cloNIDine (CATAPRES) 0.1 MG tablet Take 0.1 mg by mouth 3 (three) times daily.     dicyclomine (BENTYL) 10 MG capsule Take 10 mg by mouth 3 (three) times daily.     furosemide (LASIX) 40 MG tablet Take 1 tablet (40  mg total) by mouth daily.     gabapentin (NEURONTIN) 300 MG capsule Take 300 mg by mouth 3 (three) times daily.      hydrALAZINE (APRESOLINE) 50 MG tablet Take 50 mg by mouth 3 (three) times daily.     HYDROcodone-acetaminophen (NORCO) 10-325 MG tablet Take 1-2 tablets by mouth 4 (four) times daily as needed for moderate pain or severe pain. 10 tablet 0   insulin NPH Human (NOVOLIN N) 100 UNIT/ML injection Inject 0.18 mLs (18 Units total) into the skin every morning. Ands syringes 1/day 10 mL 11   losartan (COZAAR) 25 MG tablet Take 25 mg by mouth daily.     lubiprostone (AMITIZA) 8 MCG capsule Take by mouth.     meclizine (ANTIVERT) 25 MG tablet Take 25 mg by mouth 3 (three) times daily as needed for dizziness.     nitroGLYCERIN (NITROSTAT) 0.4 MG SL tablet Place 1 tablet (0.4 mg total) under the tongue every 5 (five) minutes as needed for chest pain. PLACE ONE TABLET UNDER THE TONGUE EVERY 5 MINUTES FOR 3 DOSES AS NEEDED Strength: 0.4 mg 25  tablet 3   ondansetron (ZOFRAN) 4 MG tablet TAKE ONE TABLET (4MG TOTAL) BY MOUTH EVERY 8 HOURS AS NEEDED FOR NAUSEA OR VOMITING 30 tablet 3   pantoprazole (PROTONIX) 40 MG tablet TAKE ONE TABLET (40MG TOTAL) BY MOUTH TWO TIMES DAILY BEFORE A MEAL 60 tablet 5   pravastatin (PRAVACHOL) 40 MG tablet Take 1 tablet (40 mg total) by mouth every evening. 90 tablet 3   TRUE METRIX BLOOD GLUCOSE TEST test strip USE TO TEST BLOOD SUGAR UP TO TWICE DAILY OR AS DIRECTED 180 each 3   valsartan (DIOVAN) 40 MG tablet Take 40 mg by mouth daily.     No current facility-administered medications for this visit.     Past Surgical History:  Procedure Laterality Date   BACK SURGERY  2007   x2   BIOPSY  02/05/2021   Procedure: BIOPSY;  Surgeon: Daneil Dolin, MD;  Location: [facility];  Service: Endoscopy;;   CARDIAC CATHETERIZATION     with stent placement   COLONOSCOPY WITH ESOPHAGOGASTRODUODENOSCOPY (EGD) N/A 11/23/2013   Dr. Oneida Alar- TCS= left sided colitis and mild proctitis likely due to ischemic colitis, moderate internal hemorrhoids, small nodule in the rectum, bx=tubular adenoma, EGD=-mild non-erosive gastritis   COLONOSCOPY WITH PROPOFOL N/A 07/24/2020   hemorrhoids,  rectosigmoid lipoma, small adenoma removed.   ESOPHAGOGASTRODUODENOSCOPY (EGD) WITH PROPOFOL N/A 02/05/2021   Normal esophagus, erythematous mucosa in entire stomach, no obvious ulcer, s/p biopsy. Normal duodenum. Negative H.pylori.    HERNIA REPAIR     LEFT HEART CATHETERIZATION WITH CORONARY ANGIOGRAM N/A 11/16/2012   Procedure: LEFT HEART CATHETERIZATION WITH CORONARY ANGIOGRAM;  Surgeon: Sherren Mocha, MD;  Location: [facility] CATH LAB;  Service: Cardiovascular;  Laterality: N/A;   PERCUTANEOUS CORONARY STENT INTERVENTION (PCI-S)  11/16/2012   Procedure: PERCUTANEOUS CORONARY STENT INTERVENTION (PCI-S);  Surgeon: Sherren Mocha, MD;  Location: [facility] CATH LAB;  Service: Cardiovascular;;   POLYPECTOMY  07/24/2020   Procedure: POLYPECTOMY;   Surgeon: Daneil Dolin, MD;  Location: [facility];  Service: Endoscopy;;   TOTAL KNEE ARTHROPLASTY Right 11/15/2013   Procedure: RIGHT TOTAL KNEE ARTHROPLASTY;  Surgeon: Mauri Pole, MD;  Location: [facility];  Service: Orthopedics;  Laterality: Right;   TOTAL KNEE ARTHROPLASTY Left 03/07/2015   Procedure: LEFT TOTAL KNEE ARTHROPLASTY;  Surgeon: Paralee Cancel, MD;  Location: [facility] OR[facility];  Service: Orthopedics;  Laterality: Left;  Allergies  Allergen Reactions   Metformin    Niacin    Niacin And Related Other (See Comments)    Reaction: flushing and burning side effects even with extended release      Family History  Problem Relation Age of Onset   Hypertension Other    Cancer Other    Heart disease Other    Diabetes Other    Hypertension Mother    Cancer Father        brain   CAD Brother    Colon cancer Neg Hx      Social History Mr. Lunde reports that he quit smoking about 29 years ago. His smoking use included cigarettes. He started smoking about 57 years ago. He has a 30.00 pack-year smoking history. He has never used smokeless tobacco. Mr. Bless reports no history of alcohol use.   Review of Systems CONSTITUTIONAL: No weight loss, fever, chills, weakness or fatigue.  HEENT: Eyes: No visual loss, blurred vision, double vision or yellow sclerae.No hearing loss, sneezing, congestion, runny nose or sore throat.  SKIN: No rash or itching.  CARDIOVASCULAR: per hpi RESPIRATORY: No shortness of breath, cough or sputum.  GASTROINTESTINAL: No anorexia, nausea, vomiting or diarrhea. No abdominal pain or blood.  GENITOURINARY: No burning on urination, no polyuria NEUROLOGICAL: No headache, dizziness, syncope, paralysis, ataxia, numbness or tingling in the extremities. No change in bowel or bladder control.  MUSCULOSKELETAL: No muscle, back pain, joint pain or stiffness.  LYMPHATICS: No enlarged nodes. No history of splenectomy.  PSYCHIATRIC: No history of depression or  anxiety.  ENDOCRINOLOGIC: No reports of sweating, cold or heat intolerance. No polyuria or polydipsia.  Marland Kitchen   Physical Examination Today's Vitals   02/08/22 1346  BP: 124/70  Pulse: (!) 56  SpO2: 98%  Weight: 272 lb 12.8 oz (123.7 kg)  Height: 6' 2.5" (1.892 m)   Body mass index is 34.56 kg/m.  Gen: resting comfortably, no acute distress HEENT: no scleral icterus, pupils equal round and reactive, no palptable cervical adenopathy,  CV: RRR, no m/r/g no jvd Resp: Clear to auscultation bilaterally GI: abdomen is soft, non-tender, non-distended, normal bowel sounds, no hepatosplenomegaly MSK: extremities are warm, no edema.  Skin: warm, no rash Neuro:  no focal deficits Psych: appropriate affect   Diagnostic Studies  03/2020 echo IMPRESSIONS     1. Left ventricular ejection fraction, by estimation, is 55 to 60%. The  left ventricle has normal function. The left ventricle has no regional  wall motion abnormalities. There is mild left ventricular hypertrophy.  Left ventricular diastolic parameters  are consistent with Grade I diastolic dysfunction (impaired relaxation).   2. Right ventricular systolic function is normal. The right ventricular  size is normal.   3. Left atrial size was mildly dilated.   4. The mitral valve is normal in structure. No evidence of mitral valve  regurgitation. No evidence of mitral stenosis.   5. The aortic valve is tricuspid. Aortic valve regurgitation is not  visualized. No aortic stenosis is present.      Jan 2022 CTA IMPRESSION: Mesenteric arterial disease is present, however, the CT does not support chronic mesenteric ischemia as a source of abdominal pain given all 3 mesenteric arteries are patent, and the greatest degree of narrowing is estimated 50% of the proximal SMA.   Bilateral renal arterial disease, worst on the right where there appears to be high-grade stenosis and associated asymmetric right renal parenchyma.   Aortic  Atherosclerosis (ICD10-I70.0).   Mild bilateral iliac  arterial disease without high-grade stenosis or occlusion.     Assessment and Plan   1. CAD - isolated episode of chest pain, negative workup in hospital as far as EKG, trops, echo - monitor at this time, if significant recurrence consider ischemic testing.    2. HTN - at goal, continue current meds. ARB stopped by neprhology due to AKI   3. Chronic diastolic - neprhology recently decreased diuretic due to AKI, defer dosing to Dr Theador Hawthorne.      [name], M.D.

## 2022-02-08 NOTE — Patient Instructions (Signed)
Medication Instructions:  Your physician has recommended you make the following change in your medication: Stop taking losartan Stop taking valsartan Continue taking other medications as prescribed  Labwork: none  Testing/Procedures: none  Follow-Up: Your physician recommends that you schedule a follow-up appointment in: 6 months  Any Other Special Instructions Will Be Listed Below (If Applicable).  If you need a refill on your cardiac medications before your next appointment, please call your pharmacy.

## 2022-02-11 DIAGNOSIS — E1129 Type 2 diabetes mellitus with other diabetic kidney complication: Secondary | ICD-10-CM | POA: Diagnosis not present

## 2022-02-11 DIAGNOSIS — E1122 Type 2 diabetes mellitus with diabetic chronic kidney disease: Secondary | ICD-10-CM | POA: Diagnosis not present

## 2022-02-11 DIAGNOSIS — R809 Proteinuria, unspecified: Secondary | ICD-10-CM | POA: Diagnosis not present

## 2022-02-11 DIAGNOSIS — I5032 Chronic diastolic (congestive) heart failure: Secondary | ICD-10-CM | POA: Diagnosis not present

## 2022-02-11 DIAGNOSIS — D638 Anemia in other chronic diseases classified elsewhere: Secondary | ICD-10-CM | POA: Diagnosis not present

## 2022-02-11 DIAGNOSIS — A881 Epidemic vertigo: Secondary | ICD-10-CM | POA: Diagnosis not present

## 2022-02-11 DIAGNOSIS — N170 Acute kidney failure with tubular necrosis: Secondary | ICD-10-CM | POA: Diagnosis not present

## 2022-02-11 DIAGNOSIS — N189 Chronic kidney disease, unspecified: Secondary | ICD-10-CM | POA: Diagnosis not present

## 2022-02-11 DIAGNOSIS — I129 Hypertensive chronic kidney disease with stage 1 through stage 4 chronic kidney disease, or unspecified chronic kidney disease: Secondary | ICD-10-CM | POA: Diagnosis not present

## 2022-02-18 DIAGNOSIS — I10 Essential (primary) hypertension: Secondary | ICD-10-CM | POA: Diagnosis not present

## 2022-02-18 DIAGNOSIS — M47816 Spondylosis without myelopathy or radiculopathy, lumbar region: Secondary | ICD-10-CM | POA: Diagnosis not present

## 2022-02-18 DIAGNOSIS — G629 Polyneuropathy, unspecified: Secondary | ICD-10-CM | POA: Diagnosis not present

## 2022-02-18 DIAGNOSIS — R600 Localized edema: Secondary | ICD-10-CM | POA: Diagnosis not present

## 2022-02-18 DIAGNOSIS — N184 Chronic kidney disease, stage 4 (severe): Secondary | ICD-10-CM | POA: Diagnosis not present

## 2022-02-18 DIAGNOSIS — M961 Postlaminectomy syndrome, not elsewhere classified: Secondary | ICD-10-CM | POA: Diagnosis not present

## 2022-02-27 DIAGNOSIS — I10 Essential (primary) hypertension: Secondary | ICD-10-CM | POA: Diagnosis not present

## 2022-02-27 DIAGNOSIS — G894 Chronic pain syndrome: Secondary | ICD-10-CM | POA: Diagnosis not present

## 2022-02-27 DIAGNOSIS — M5136 Other intervertebral disc degeneration, lumbar region: Secondary | ICD-10-CM | POA: Diagnosis not present

## 2022-02-27 DIAGNOSIS — E1129 Type 2 diabetes mellitus with other diabetic kidney complication: Secondary | ICD-10-CM | POA: Diagnosis not present

## 2022-02-27 DIAGNOSIS — M5030 Other cervical disc degeneration, unspecified cervical region: Secondary | ICD-10-CM | POA: Diagnosis not present

## 2022-03-20 DIAGNOSIS — A881 Epidemic vertigo: Secondary | ICD-10-CM | POA: Diagnosis not present

## 2022-03-27 DIAGNOSIS — G629 Polyneuropathy, unspecified: Secondary | ICD-10-CM | POA: Diagnosis not present

## 2022-03-27 DIAGNOSIS — M47816 Spondylosis without myelopathy or radiculopathy, lumbar region: Secondary | ICD-10-CM | POA: Diagnosis not present

## 2022-03-27 DIAGNOSIS — M961 Postlaminectomy syndrome, not elsewhere classified: Secondary | ICD-10-CM | POA: Diagnosis not present

## 2022-03-28 DIAGNOSIS — I499 Cardiac arrhythmia, unspecified: Secondary | ICD-10-CM | POA: Diagnosis not present

## 2022-03-28 DIAGNOSIS — K219 Gastro-esophageal reflux disease without esophagitis: Secondary | ICD-10-CM | POA: Diagnosis not present

## 2022-03-28 DIAGNOSIS — E782 Mixed hyperlipidemia: Secondary | ICD-10-CM | POA: Diagnosis not present

## 2022-03-28 DIAGNOSIS — I10 Essential (primary) hypertension: Secondary | ICD-10-CM | POA: Diagnosis not present

## 2022-03-28 DIAGNOSIS — I2510 Atherosclerotic heart disease of native coronary artery without angina pectoris: Secondary | ICD-10-CM | POA: Diagnosis not present

## 2022-03-28 DIAGNOSIS — Z0001 Encounter for general adult medical examination with abnormal findings: Secondary | ICD-10-CM | POA: Diagnosis not present

## 2022-03-28 DIAGNOSIS — A881 Epidemic vertigo: Secondary | ICD-10-CM | POA: Diagnosis not present

## 2022-03-28 DIAGNOSIS — E1121 Type 2 diabetes mellitus with diabetic nephropathy: Secondary | ICD-10-CM | POA: Diagnosis not present

## 2022-03-28 DIAGNOSIS — G894 Chronic pain syndrome: Secondary | ICD-10-CM | POA: Diagnosis not present

## 2022-03-28 DIAGNOSIS — M48061 Spinal stenosis, lumbar region without neurogenic claudication: Secondary | ICD-10-CM | POA: Diagnosis not present

## 2022-04-08 ENCOUNTER — Ambulatory Visit: Payer: Medicare Other | Admitting: Cardiology

## 2022-04-11 DIAGNOSIS — M961 Postlaminectomy syndrome, not elsewhere classified: Secondary | ICD-10-CM | POA: Diagnosis not present

## 2022-04-11 DIAGNOSIS — G629 Polyneuropathy, unspecified: Secondary | ICD-10-CM | POA: Diagnosis not present

## 2022-04-15 DIAGNOSIS — R809 Proteinuria, unspecified: Secondary | ICD-10-CM | POA: Diagnosis not present

## 2022-04-15 DIAGNOSIS — N189 Chronic kidney disease, unspecified: Secondary | ICD-10-CM | POA: Diagnosis not present

## 2022-04-15 DIAGNOSIS — E1122 Type 2 diabetes mellitus with diabetic chronic kidney disease: Secondary | ICD-10-CM | POA: Diagnosis not present

## 2022-04-15 DIAGNOSIS — N170 Acute kidney failure with tubular necrosis: Secondary | ICD-10-CM | POA: Diagnosis not present

## 2022-04-15 DIAGNOSIS — I129 Hypertensive chronic kidney disease with stage 1 through stage 4 chronic kidney disease, or unspecified chronic kidney disease: Secondary | ICD-10-CM | POA: Diagnosis not present

## 2022-04-16 DIAGNOSIS — I10 Essential (primary) hypertension: Secondary | ICD-10-CM | POA: Diagnosis not present

## 2022-04-16 DIAGNOSIS — M79674 Pain in right toe(s): Secondary | ICD-10-CM | POA: Diagnosis not present

## 2022-04-16 DIAGNOSIS — M1991 Primary osteoarthritis, unspecified site: Secondary | ICD-10-CM | POA: Diagnosis not present

## 2022-04-16 DIAGNOSIS — N184 Chronic kidney disease, stage 4 (severe): Secondary | ICD-10-CM | POA: Diagnosis not present

## 2022-04-16 DIAGNOSIS — J329 Chronic sinusitis, unspecified: Secondary | ICD-10-CM | POA: Diagnosis not present

## 2022-04-16 DIAGNOSIS — M79675 Pain in left toe(s): Secondary | ICD-10-CM | POA: Diagnosis not present

## 2022-04-16 DIAGNOSIS — M79672 Pain in left foot: Secondary | ICD-10-CM | POA: Diagnosis not present

## 2022-04-16 DIAGNOSIS — L110 Acquired keratosis follicularis: Secondary | ICD-10-CM | POA: Diagnosis not present

## 2022-04-16 DIAGNOSIS — I739 Peripheral vascular disease, unspecified: Secondary | ICD-10-CM | POA: Diagnosis not present

## 2022-04-16 DIAGNOSIS — M79671 Pain in right foot: Secondary | ICD-10-CM | POA: Diagnosis not present

## 2022-04-16 DIAGNOSIS — E1140 Type 2 diabetes mellitus with diabetic neuropathy, unspecified: Secondary | ICD-10-CM | POA: Diagnosis not present

## 2022-04-16 DIAGNOSIS — M5136 Other intervertebral disc degeneration, lumbar region: Secondary | ICD-10-CM | POA: Diagnosis not present

## 2022-04-18 ENCOUNTER — Telehealth: Payer: Self-pay | Admitting: Cardiology

## 2022-04-18 ENCOUNTER — Ambulatory Visit (INDEPENDENT_AMBULATORY_CARE_PROVIDER_SITE_OTHER): Payer: Medicare Other | Admitting: *Deleted

## 2022-04-18 VITALS — BP 158/78 | HR 78 | Ht 74.5 in | Wt 276.0 lb

## 2022-04-18 DIAGNOSIS — I208 Other forms of angina pectoris: Secondary | ICD-10-CM | POA: Diagnosis not present

## 2022-04-18 DIAGNOSIS — I10 Essential (primary) hypertension: Secondary | ICD-10-CM | POA: Diagnosis not present

## 2022-04-18 DIAGNOSIS — E1129 Type 2 diabetes mellitus with other diabetic kidney complication: Secondary | ICD-10-CM | POA: Diagnosis not present

## 2022-04-18 DIAGNOSIS — E8722 Chronic metabolic acidosis: Secondary | ICD-10-CM | POA: Diagnosis not present

## 2022-04-18 DIAGNOSIS — R079 Chest pain, unspecified: Secondary | ICD-10-CM

## 2022-04-18 DIAGNOSIS — D638 Anemia in other chronic diseases classified elsewhere: Secondary | ICD-10-CM | POA: Diagnosis not present

## 2022-04-18 DIAGNOSIS — E1122 Type 2 diabetes mellitus with diabetic chronic kidney disease: Secondary | ICD-10-CM | POA: Diagnosis not present

## 2022-04-18 DIAGNOSIS — E211 Secondary hyperparathyroidism, not elsewhere classified: Secondary | ICD-10-CM | POA: Diagnosis not present

## 2022-04-18 DIAGNOSIS — I129 Hypertensive chronic kidney disease with stage 1 through stage 4 chronic kidney disease, or unspecified chronic kidney disease: Secondary | ICD-10-CM | POA: Diagnosis not present

## 2022-04-18 DIAGNOSIS — R809 Proteinuria, unspecified: Secondary | ICD-10-CM | POA: Diagnosis not present

## 2022-04-18 DIAGNOSIS — I5032 Chronic diastolic (congestive) heart failure: Secondary | ICD-10-CM | POA: Diagnosis not present

## 2022-04-18 DIAGNOSIS — N189 Chronic kidney disease, unspecified: Secondary | ICD-10-CM | POA: Diagnosis not present

## 2022-04-18 NOTE — Telephone Encounter (Signed)
[name] walked in and, wants to ask why his chest is hurting when he does cardio exercises. He said he is having pain walking from his car to here. No SOB, no other pain anywhere else. He has not took any nitro today. He has not took any in the last few days. He said when he does take them the pain subsides and, come back. He wants to be on the safe side and, says he may need a stress test.  ?

## 2022-04-18 NOTE — Progress Notes (Signed)
Pt walked in office and states that he would like to be seen by a doctor today. Informed pt that no appt is available today. Pt states that he was seen recently for chest pain on exertion. Pt states that he was seen in the ED and stayed overnight and told that he was in AFib. Pt states that he was also seen by Dr. Harl Bowie for the same thing. Pt asked to be checked out. Encouraged pt to be seen in ED. Pt then asked to have BP checked on today. Nurse visit scheduled. Will route phone note and nurse visit to Dr. Harl Bowie.  ?

## 2022-04-18 NOTE — Telephone Encounter (Signed)
Nurse apt made ?

## 2022-04-19 ENCOUNTER — Telehealth: Payer: Self-pay | Admitting: Cardiology

## 2022-04-19 ENCOUNTER — Encounter: Payer: Self-pay | Admitting: *Deleted

## 2022-04-19 NOTE — Telephone Encounter (Signed)
Follow Up: ? ? ? ? ?Patient saw Karen Kays yesterday at the office.Marland Kitchen He said he was told to call back today(04-19-22) to see what was decided. ?

## 2022-04-19 NOTE — Telephone Encounter (Signed)
Can order lexiscan for chest pain. I had previously said for him to see me next week but can hold off on that and just get lexiscan please ? ?Zandra Abts MD ?

## 2022-04-19 NOTE — Telephone Encounter (Signed)
Can order lexiscan for chest pain. I had previously said for him to see me next week but can hold off on that and just get lexiscan please ?  ?Zandra Abts MD  ?  ? ?

## 2022-04-19 NOTE — Telephone Encounter (Signed)
Routed nurse visit to provider for review/recommendations.  ?

## 2022-04-19 NOTE — Telephone Encounter (Signed)
Left a message for pt to call office back.  °

## 2022-04-19 NOTE — Progress Notes (Signed)
Can see may 4th at 2pm as add on patient, if severe symptoms needs ER evaluation  ? ?Zandra Abts MD ?

## 2022-04-19 NOTE — Telephone Encounter (Signed)
Can see may 4th at 2pm as add on patient, if severe symptoms needs ER evaluation  ?  ?Zandra Abts MD ?

## 2022-04-19 NOTE — Addendum Note (Signed)
Addended by: Levonne Hubert on: 04/19/2022 03:08 PM ? ? Modules accepted: Orders ? ?

## 2022-04-22 NOTE — Telephone Encounter (Signed)
Pt notified of the need to have Lexi scan done.  ?

## 2022-04-22 NOTE — Telephone Encounter (Signed)
Lexiscan scan scheduled for 04/29/22 ?

## 2022-04-24 DIAGNOSIS — G8929 Other chronic pain: Secondary | ICD-10-CM | POA: Diagnosis not present

## 2022-04-24 DIAGNOSIS — A881 Epidemic vertigo: Secondary | ICD-10-CM | POA: Diagnosis not present

## 2022-04-29 ENCOUNTER — Ambulatory Visit (HOSPITAL_COMMUNITY): Admission: RE | Admit: 2022-04-29 | Payer: Medicare Other | Source: Ambulatory Visit

## 2022-04-29 ENCOUNTER — Encounter (HOSPITAL_COMMUNITY): Admission: RE | Admit: 2022-04-29 | Payer: Medicare Other | Source: Ambulatory Visit

## 2022-05-06 ENCOUNTER — Ambulatory Visit (HOSPITAL_COMMUNITY)
Admission: RE | Admit: 2022-05-06 | Discharge: 2022-05-06 | Disposition: A | Discharge status: Home / Self Care | Payer: Medicare Other | Source: Ambulatory Visit | Attending: Cardiology | Admitting: Cardiology

## 2022-05-06 ENCOUNTER — Encounter: Payer: Self-pay | Admitting: Physician Assistant

## 2022-05-06 DIAGNOSIS — R079 Chest pain, unspecified: Secondary | ICD-10-CM | POA: Insufficient documentation

## 2022-05-06 LAB — NM MYOCAR MULTI W/SPECT W/WALL MOTION / EF
LV dias vol: 222 mL (ref 62–150)
LV sys vol: 103 mL
Nuc Stress EF: 55 %
Peak HR: 87 {beats}/min
Rest HR: 65 {beats}/min
ST Depression (mm): 0 mm

## 2022-05-06 MED ORDER — REGADENOSON 0.4 MG/5ML IV SOLN
INTRAVENOUS | Status: AC
  Administered 2022-05-06: 0.4 mg via INTRAVENOUS
  Filled 2022-05-06: qty 5

## 2022-05-06 MED ORDER — SODIUM CHLORIDE FLUSH 0.9 % IV SOLN
INTRAVENOUS | Status: AC
  Administered 2022-05-06: 10 mL via INTRAVENOUS
  Filled 2022-05-06: qty 10

## 2022-05-06 MED ORDER — TECHNETIUM TC 99M TETROFOSMIN IV KIT
30.0000 | PACK | Freq: Once | INTRAVENOUS | Status: AC | PRN
  Administered 2022-05-06: 30 via INTRAVENOUS

## 2022-05-06 MED ORDER — TECHNETIUM TC 99M TETROFOSMIN IV KIT
10.0000 | PACK | Freq: Once | INTRAVENOUS | Status: AC | PRN
  Administered 2022-05-06: 10 via INTRAVENOUS

## 2022-05-06 NOTE — Progress Notes (Signed)
Lexiscan myoview completed today. Pre-test EKG showed NSR with long first degree AVB with occasional PVC similar to prior though did appear to occasionally pick up 2nd degree type 1 AVB. D/w Dr. Harl Bowie who felt it was OK to proceed with close monitoring. The patient tolerated test well without any significant changes from baseline. He did remain hypertensive at end of test with recheck but had not taken any of his home BPs meds yet today which was felt contributing - per d/w MD, he felt patient was OK to complete test and go home but was instructed to take his usual BP meds as soon as he got home. He remained asymptomatic during the entire test. ?

## 2022-05-07 ENCOUNTER — Telehealth: Payer: Self-pay | Admitting: Cardiology

## 2022-05-07 NOTE — Telephone Encounter (Signed)
? ?  Pt is calling to get stress test result, he said he was told by tech it will be available today ?

## 2022-05-07 NOTE — Telephone Encounter (Signed)
Addressed in result note encounter. ?

## 2022-05-07 NOTE — Telephone Encounter (Signed)
Just resulted ? ? ?Zandra Abts MD ?

## 2022-05-08 DIAGNOSIS — A881 Epidemic vertigo: Secondary | ICD-10-CM | POA: Diagnosis not present

## 2022-05-09 ENCOUNTER — Emergency Department (HOSPITAL_COMMUNITY): Payer: Medicare Other

## 2022-05-09 ENCOUNTER — Other Ambulatory Visit: Payer: Self-pay

## 2022-05-09 ENCOUNTER — Encounter (HOSPITAL_COMMUNITY): Payer: Self-pay

## 2022-05-09 ENCOUNTER — Observation Stay (HOSPITAL_COMMUNITY)
Admission: EM | Admit: 2022-05-09 | Discharge: 2022-05-11 | Disposition: A | Discharge status: Home / Self Care | Payer: Medicare Other | Attending: Internal Medicine | Admitting: Internal Medicine

## 2022-05-09 DIAGNOSIS — R072 Precordial pain: Secondary | ICD-10-CM | POA: Diagnosis not present

## 2022-05-09 DIAGNOSIS — E1141 Type 2 diabetes mellitus with diabetic mononeuropathy: Secondary | ICD-10-CM | POA: Insufficient documentation

## 2022-05-09 DIAGNOSIS — E1165 Type 2 diabetes mellitus with hyperglycemia: Secondary | ICD-10-CM | POA: Diagnosis not present

## 2022-05-09 DIAGNOSIS — I130 Hypertensive heart and chronic kidney disease with heart failure and stage 1 through stage 4 chronic kidney disease, or unspecified chronic kidney disease: Secondary | ICD-10-CM | POA: Diagnosis not present

## 2022-05-09 DIAGNOSIS — R778 Other specified abnormalities of plasma proteins: Secondary | ICD-10-CM | POA: Insufficient documentation

## 2022-05-09 DIAGNOSIS — R079 Chest pain, unspecified: Secondary | ICD-10-CM | POA: Diagnosis present

## 2022-05-09 DIAGNOSIS — Z859 Personal history of malignant neoplasm, unspecified: Secondary | ICD-10-CM | POA: Diagnosis not present

## 2022-05-09 DIAGNOSIS — I25110 Atherosclerotic heart disease of native coronary artery with unstable angina pectoris: Secondary | ICD-10-CM | POA: Insufficient documentation

## 2022-05-09 DIAGNOSIS — E1122 Type 2 diabetes mellitus with diabetic chronic kidney disease: Secondary | ICD-10-CM | POA: Insufficient documentation

## 2022-05-09 DIAGNOSIS — N184 Chronic kidney disease, stage 4 (severe): Secondary | ICD-10-CM | POA: Diagnosis not present

## 2022-05-09 DIAGNOSIS — E1140 Type 2 diabetes mellitus with diabetic neuropathy, unspecified: Secondary | ICD-10-CM

## 2022-05-09 DIAGNOSIS — I5032 Chronic diastolic (congestive) heart failure: Secondary | ICD-10-CM | POA: Diagnosis not present

## 2022-05-09 DIAGNOSIS — K219 Gastro-esophageal reflux disease without esophagitis: Secondary | ICD-10-CM | POA: Diagnosis present

## 2022-05-09 DIAGNOSIS — Z79899 Other long term (current) drug therapy: Secondary | ICD-10-CM | POA: Insufficient documentation

## 2022-05-09 DIAGNOSIS — Z96653 Presence of artificial knee joint, bilateral: Secondary | ICD-10-CM | POA: Diagnosis not present

## 2022-05-09 DIAGNOSIS — Z87891 Personal history of nicotine dependence: Secondary | ICD-10-CM | POA: Insufficient documentation

## 2022-05-09 DIAGNOSIS — N189 Chronic kidney disease, unspecified: Secondary | ICD-10-CM

## 2022-05-09 DIAGNOSIS — Z7982 Long term (current) use of aspirin: Secondary | ICD-10-CM | POA: Insufficient documentation

## 2022-05-09 DIAGNOSIS — R680 Hypothermia, not associated with low environmental temperature: Secondary | ICD-10-CM | POA: Insufficient documentation

## 2022-05-09 DIAGNOSIS — D649 Anemia, unspecified: Secondary | ICD-10-CM | POA: Diagnosis not present

## 2022-05-09 DIAGNOSIS — M1000 Idiopathic gout, unspecified site: Secondary | ICD-10-CM | POA: Diagnosis not present

## 2022-05-09 DIAGNOSIS — I2510 Atherosclerotic heart disease of native coronary artery without angina pectoris: Secondary | ICD-10-CM

## 2022-05-09 DIAGNOSIS — Z9861 Coronary angioplasty status: Secondary | ICD-10-CM

## 2022-05-09 DIAGNOSIS — E1169 Type 2 diabetes mellitus with other specified complication: Secondary | ICD-10-CM | POA: Insufficient documentation

## 2022-05-09 DIAGNOSIS — J9811 Atelectasis: Secondary | ICD-10-CM | POA: Diagnosis not present

## 2022-05-09 DIAGNOSIS — M109 Gout, unspecified: Secondary | ICD-10-CM | POA: Diagnosis present

## 2022-05-09 DIAGNOSIS — Z955 Presence of coronary angioplasty implant and graft: Secondary | ICD-10-CM | POA: Insufficient documentation

## 2022-05-09 DIAGNOSIS — E1142 Type 2 diabetes mellitus with diabetic polyneuropathy: Secondary | ICD-10-CM | POA: Diagnosis not present

## 2022-05-09 DIAGNOSIS — Z794 Long term (current) use of insulin: Secondary | ICD-10-CM | POA: Diagnosis not present

## 2022-05-09 DIAGNOSIS — R0789 Other chest pain: Secondary | ICD-10-CM | POA: Diagnosis not present

## 2022-05-09 DIAGNOSIS — R0602 Shortness of breath: Secondary | ICD-10-CM | POA: Diagnosis not present

## 2022-05-09 DIAGNOSIS — E782 Mixed hyperlipidemia: Secondary | ICD-10-CM | POA: Diagnosis not present

## 2022-05-09 DIAGNOSIS — I10 Essential (primary) hypertension: Secondary | ICD-10-CM | POA: Diagnosis not present

## 2022-05-09 DIAGNOSIS — N179 Acute kidney failure, unspecified: Secondary | ICD-10-CM | POA: Insufficient documentation

## 2022-05-09 LAB — CBC
HCT: 26.4 % — ABNORMAL LOW (ref 39.0–52.0)
Hemoglobin: 8.7 g/dL — ABNORMAL LOW (ref 13.0–17.0)
MCH: 32.6 pg (ref 26.0–34.0)
MCHC: 33 g/dL (ref 30.0–36.0)
MCV: 98.9 fL (ref 80.0–100.0)
Platelets: 192 10*3/uL (ref 150–400)
RBC: 2.67 MIL/uL — ABNORMAL LOW (ref 4.22–5.81)
RDW: 13.5 % (ref 11.5–15.5)
WBC: 5.4 10*3/uL (ref 4.0–10.5)
nRBC: 0 % (ref 0.0–0.2)

## 2022-05-09 LAB — BASIC METABOLIC PANEL
Anion gap: 11 (ref 5–15)
BUN: 80 mg/dL — ABNORMAL HIGH (ref 8–23)
CO2: 23 mmol/L (ref 22–32)
Calcium: 9 mg/dL (ref 8.9–10.3)
Chloride: 101 mmol/L (ref 98–111)
Creatinine, Ser: 4.12 mg/dL — ABNORMAL HIGH (ref 0.61–1.24)
GFR, Estimated: 14 mL/min — ABNORMAL LOW (ref >60)
Glucose, Bld: 166 mg/dL — ABNORMAL HIGH (ref 70–99)
Potassium: 4 mmol/L (ref 3.5–5.1)
Sodium: 135 mmol/L (ref 135–145)

## 2022-05-09 LAB — TROPONIN I (HIGH SENSITIVITY)
Troponin I (High Sensitivity): 25 ng/L — ABNORMAL HIGH (ref <18)
Troponin I (High Sensitivity): 32 ng/L — ABNORMAL HIGH (ref <18)

## 2022-05-09 MED ORDER — HEPARIN SODIUM (PORCINE) 5000 UNIT/ML IJ SOLN
5000.0000 [IU] | Freq: Three times a day (TID) | INTRAMUSCULAR | Status: DC
  Administered 2022-05-10 – 2022-05-11 (×5): 5000 [IU] via SUBCUTANEOUS
  Filled 2022-05-09 (×5): qty 1

## 2022-05-09 MED ORDER — ACETAMINOPHEN 650 MG RE SUPP
650.0000 mg | Freq: Four times a day (QID) | RECTAL | Status: DC | PRN
Start: 2022-05-09 — End: 2022-05-11

## 2022-05-09 MED ORDER — NITROGLYCERIN 0.4 MG SL SUBL
0.4000 mg | SUBLINGUAL_TABLET | Freq: Once | SUBLINGUAL | Status: AC
  Administered 2022-05-09: 0.4 mg via SUBLINGUAL
  Filled 2022-05-09: qty 1

## 2022-05-09 MED ORDER — ACETAMINOPHEN 325 MG PO TABS
650.0000 mg | ORAL_TABLET | Freq: Four times a day (QID) | ORAL | Status: DC | PRN
  Administered 2022-05-10 – 2022-05-11 (×4): 650 mg via ORAL
  Filled 2022-05-09 (×4): qty 2

## 2022-05-09 MED ORDER — ASPIRIN 325 MG PO TABS: 325.0000 mg | ORAL_TABLET | Freq: Every day | ORAL | Status: DC

## 2022-05-09 MED ORDER — SODIUM CHLORIDE 0.9 % IV BOLUS
500.0000 mL | Freq: Once | INTRAVENOUS | Status: AC
  Administered 2022-05-09: 500 mL via INTRAVENOUS

## 2022-05-09 NOTE — ED Provider Notes (Signed)
[facility] EMERGENCY DEPARTMENT Provider Note   CSN: [id number] Arrival date & time: 05/09/22  1857     History  Chief Complaint  Patient presents with   Chest Pain    [name] is a 76 y.o. male.  Pt complains of chest pain.  Pt reports he sees Dr. Harl Bowie.  Pt had a nuclear medicine stress test on Monday.  Pt reports he had an episode of pain today in his chest.  Pt reports he took nitroglycerin with relief.  Pt expresses concern that the stents that he has are blocked.  Pt reports more swelling in his extremities than usual.  Pt reports he is having increased shortness of breath with activity.    The history is provided by the patient. No language interpreter was used.  Chest Pain Pain location:  Substernal area Pain quality: aching   Pain radiates to:  Does not radiate Pain severity:  Moderate Duration:  1 day Timing:  Constant Progression:  Worsening Relieved by:  Nitroglycerin Worsened by:  Nothing Ineffective treatments:  None tried Associated symptoms: shortness of breath   Associated symptoms: no fever   Risk factors: coronary artery disease, diabetes mellitus and hypertension       Home Medications Prior to Admission medications   Medication Sig Start Date End Date Taking? Authorizing Provider  acarbose (PRECOSE) 50 MG tablet Take 50 mg by mouth in the morning, at noon, and at bedtime.  10/20/17  Yes [provider]  acetaminophen (TYLENOL) 650 MG CR tablet Take 1,300 mg by mouth every 8 (eight) hours as needed for pain.   Yes [provider]  allopurinol (ZYLOPRIM) 100 MG tablet Take 100-150 mg by mouth 2 (two) times daily. Takes 1.5 tablet in am and 1 tablet in evening 08/17/20  Yes [provider]  amLODipine (NORVASC) 10 MG tablet Take 10 mg by mouth every evening.    Yes [provider]  aspirin EC 81 MG tablet Take 1 tablet (81 mg total) by mouth daily. 09/14/19  Yes Herminio Commons, MD  calcitRIOL (ROCALTROL)  0.25 MCG capsule Take 0.25 mcg by mouth every Sunday.  12/13/16  Yes [provider]  carvedilol (COREG) 6.25 MG tablet Take 6.25 mg by mouth 2 (two) times daily with a meal. 04/18/22 04/18/23 Yes [provider]  cholecalciferol (VITAMIN D) 1000 units tablet Take 1,000 Units daily by mouth.   Yes [provider]  cloNIDine (CATAPRES) 0.1 MG tablet Take 0.1 mg by mouth 3 (three) times daily. 02/15/20  Yes [provider]  furosemide (LASIX) 40 MG tablet Take 1 tablet (40 mg total) by mouth daily. Patient taking differently: Take 20-40 mg by mouth daily as needed for fluid. 02/07/22  Yes Johnson, Clanford L, MD  gabapentin (NEURONTIN) 300 MG capsule Take 300 mg by mouth 3 (three) times daily.  10/19/19  Yes [provider]  hydrALAZINE (APRESOLINE) 50 MG tablet Take 100 mg by mouth 2 (two) times daily. 01/24/22  Yes [provider]  insulin NPH Human (NOVOLIN N) 100 UNIT/ML injection Inject 0.18 mLs (18 Units total) into the skin every morning. Ands syringes 1/day 06/27/21  Yes Renato Shin, MD  lubiprostone (AMITIZA) 8 MCG capsule Take by mouth.   Yes [provider]  meclizine (ANTIVERT) 25 MG tablet Take 25 mg by mouth 3 (three) times daily as needed for dizziness.   Yes [provider]  nitroGLYCERIN (NITROSTAT) 0.4 MG SL tablet Place 1 tablet (0.4 mg total) under  the tongue every 5 (five) minutes as needed for chest pain. PLACE ONE TABLET UNDER THE TONGUE EVERY 5 MINUTES FOR 3 DOSES AS NEEDED Strength: 0.4 mg 02/07/22  Yes Branch, Alphonse Guild, MD  pantoprazole (PROTONIX) 40 MG tablet TAKE ONE TABLET (40MG TOTAL) BY MOUTH TWO TIMES DAILY BEFORE A MEAL Patient taking differently: Take 40 mg by mouth 2 (two) times daily. 05/11/21  Yes Erenest Rasher, PA-C  pravastatin (PRAVACHOL) 40 MG tablet Take 1 tablet (40 mg total) by mouth every evening. 07/31/21 02/09/28 Yes Imogene Burn, PA-C  dicyclomine (BENTYL) 10 MG capsule Take 10 mg by  mouth 3 (three) times daily. Patient not taking: Reported on 05/09/2022    [provider]  ondansetron (ZOFRAN) 4 MG tablet TAKE ONE TABLET (4MG TOTAL) BY MOUTH EVERY 8 HOURS AS NEEDED FOR NAUSEA OR VOMITING Patient not taking: Reported on 05/09/2022 04/24/21   Annitta Needs, NP  TRUE METRIX BLOOD GLUCOSE TEST test strip USE TO TEST BLOOD SUGAR UP TO TWICE DAILY OR AS DIRECTED 08/31/21   Renato Shin, MD      Allergies    Metformin, Niacin, and Niacin and related    Review of Systems   Review of Systems  Constitutional:  Negative for fever.  Respiratory:  Positive for shortness of breath.   Cardiovascular:  Positive for chest pain.  All other systems reviewed and are negative.  Physical Exam Updated Vital Signs BP 140/63   Pulse 68   Temp 98 F (36.7 C) (Oral)   Resp 14   SpO2 97%  Physical Exam Vitals and nursing note reviewed.  Constitutional:      General: He is not in acute distress.    Appearance: He is well-developed.  HENT:     Head: Normocephalic and atraumatic.  Eyes:     Conjunctiva/sclera: Conjunctivae normal.  Cardiovascular:     Rate and Rhythm: Normal rate and regular rhythm.     Heart sounds: Normal heart sounds. No murmur heard. Pulmonary:     Effort: Pulmonary effort is normal. No respiratory distress.     Breath sounds: Normal breath sounds.  Abdominal:     Palpations: Abdomen is soft.     Tenderness: There is no abdominal tenderness.  Musculoskeletal:        General: No swelling. Normal range of motion.     Cervical back: Normal range of motion and neck supple.  Skin:    General: Skin is warm and dry.     Capillary Refill: Capillary refill takes less than 2 seconds.  Neurological:     Mental Status: He is alert.  Psychiatric:        Mood and Affect: Mood normal.    ED Results / Procedures / Treatments   Labs (all labs ordered are listed, but only abnormal results are displayed) Labs Reviewed  BASIC METABOLIC PANEL - Abnormal; Notable  for the following components:      Result Value   Glucose, Bld 166 (*)    BUN 80 (*)    Creatinine, Ser 4.12 (*)    GFR, Estimated 14 (*)    All other components within normal limits  CBC - Abnormal; Notable for the following components:   RBC 2.67 (*)    Hemoglobin 8.7 (*)    HCT 26.4 (*)    All other components within normal limits  TROPONIN I (HIGH SENSITIVITY) - Abnormal; Notable for the following components:   Troponin I (High Sensitivity) 25 (*)  All other components within normal limits  TROPONIN I (HIGH SENSITIVITY)    EKG EKG Interpretation  Date/Time:  Thursday May 09 2022 19:17:35 EDT Ventricular Rate:  67 PR Interval:    QRS Duration: 100 QT Interval:  430 QTC Calculation: 454 R Axis:   -11 Text Interpretation: Atrial fibrillation with premature ventricular or aberrantly conducted complexes Minimal voltage criteria for LVH, may be normal variant ( R in aVL ) T wave abnormality, consider lateral ischemia Abnormal ECG Confirmed by Sherwood Gambler [phone]) on 05/09/2022 7:50:21 PM  Radiology DG Chest 2 View  Result Date: 05/09/2022 CLINICAL DATA:  Chest pain starting today.  Shortness of breath. EXAM: CHEST - 2 VIEW COMPARISON:  02/06/2022 FINDINGS: Stable moderate enlargement of the cardiopericardial silhouette. The patient is rotated to the right on today's radiograph, reducing diagnostic sensitivity and specificity. Linear subsegmental atelectasis or scarring at the right lung base. Minimal blunting of the left posterior costophrenic angle possibly from scarring or trace pleural effusion. Thoracic spondylosis. IMPRESSION: 1. Subsegmental atelectasis at the right lung base. Minimal blunting left posterior costophrenic angle potentially from scarring or trace pleural effusion. 2. Stable moderate enlargement of the cardiopericardial silhouette. Electronically Signed   By: Van Clines M.D.   On: 05/09/2022 20:31    Procedures Procedures    Medications Ordered in  ED Medications - No data to display  ED Course/ Medical Decision Making/ A&P                           Medical Decision Making Pt reports chest pain and increased shortness of breath today.    Problems Addressed: Chest pain, unspecified type: acute illness or injury  Amount and/or Complexity of Data Reviewed External Data Reviewed: notes.    Details: Cardiology notes reviewed Labs: ordered. Decision-making details documented in ED Course.    Details: Labs ordered reviewed and interpreted.  BUN and creatine increased from baseling. Radiology: ordered and independent interpretation performed. Decision-making details documented in ED Course.    Details: Chest xray  no acute abnormality ECG/medicine tests: ordered and independent interpretation performed. Decision-making details documented in ED Course.    Details: Afib, Discussion of management or test interpretation with external provider(s): I discussed with Hospitalist.  Dr. Josephine Cables who will see for admission   Risk Prescription drug management. Decision regarding hospitalization.           Final Clinical Impression(s) / ED Diagnoses Final diagnoses:  Chest pain, unspecified type  Acute kidney injury [facility])    Rx / DC Orders ED Discharge Orders     None         Sidney Ace 05/09/22 2321    Sherwood Gambler, MD 05/10/22 1555

## 2022-05-09 NOTE — ED Notes (Signed)
IV access attempted x 2- unsuccessful

## 2022-05-09 NOTE — ED Notes (Signed)
IV access attempted X3 with no success

## 2022-05-09 NOTE — ED Triage Notes (Signed)
Pt reports chest pain that started today.  Reports sob with exertion.  Resp even and unlabored.  Skin warm and dry.  Pt reports that he is more swollen than normal.  Reports took 1 nitro prior to arrival.  Took 81 mg asa.  Reports pain is dull.

## 2022-05-09 NOTE — H&P (Signed)
History and Physical    Patient: [name] TDD:[id number] DOB: 01-Mar-1946 DOA: 05/09/2022 DOS: the patient was seen and examined on 05/10/2022 PCP: Redmond School, MD  Patient coming from: Home  Chief Complaint:  Chief Complaint  Patient presents with   Chest Pain   HPI: [name] is a 76 y.o. male with medical history significant of essential hypertension, GERD, gout, CAD s/p stent placement, type II DM who presents to the emergency department due to chest pain.  Patient complained of intermittent chest pain which has been ongoing for about 1 month, but which has progressively worsened.  Chest pain was midsternal, nonradiating, nonreproducible, relieved with rest and nitroglycerin, aggravated with exertion.  Patient follows with Dr. Harl Bowie and had a nuclear medicine stress test done on Monday (which showed abnormal LV perfusion and small apical infarct with mild peri-infarct ischemia.  LVEF is 55%).  He complained of rapidly worsening chest pain today that was persistent, pain was rated as 10/10 on pain scale, nitroglycerin taken elevated the pain and he was concerned that prior stents he had may be blocked.  He also complained of leg swelling bilaterally and shortness of breath on exertion.  He denies fever, chills, nausea, vomiting, abdominal pain.  ED Course:  In the emergency department, he was intermittently bradycardic, but other vital signs are within normal range.  Work-up in the ED showed normocytic anemia BMP showed BUN/creatinine 80/4.12 (baseline creatinine at 2.6-2.8).  Glucose 166, troponin x2 -25 > 32. Chest x-ray showed subsegmental atelectasis at the right lung base. Minimal blunting left posterior costophrenic angle potentially from scarring or trace pleural effusion. Stable moderate enlargement of the cardiopericardial silhouette. Nitroglycerin sublingual was given with improved chest pain now rated as 2-3/10 on pain scale.  IV hydration of IV NS 500 mL x 1 was  given.  Hospitalist was asked to admit patient for further evaluation and management.  Review of Systems: Review of systems as noted in the HPI. All other systems reviewed and are negative.   Past Medical History:  Diagnosis Date   Anxiety    Benign prostatic hypertrophy    Nocturia   CAD (coronary artery disease)    a.  NSTEMI 10/2012 s/p DES to LAD & DES to 1st diagonal with residual diffuse nonobstructive dz in LCx/RCA    Chronic kidney disease, stage 3, mod decreased GFR (HCC)    Creatinine of 1.5 in 03/2009, STAGE 4 now   Degenerative joint disease    Depression    hx of   Diabetes mellitus, type II ([place]) 12/10/2012.   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 Oneida Alar-  TCS= left sided colitis and mild proctitis likely due to ischemic colitis, moderate internal hemorrhoids, small nodule in the rectum, bx=tubular adenoma, EGD=-mild non-erosive gastritis   COLONOSCOPY WITH PROPOFOL N/A 07/24/2020   hemorrhoids,  rectosigmoid lipoma, small adenoma removed.   ESOPHAGOGASTRODUODENOSCOPY (EGD) WITH PROPOFOL N/A 02/05/2021   Normal esophagus, erythematous mucosa in entire stomach, no obvious ulcer, s/p biopsy. Normal duodenum. Negative H.pylori.    HERNIA REPAIR     LEFT HEART  CATHETERIZATION WITH CORONARY ANGIOGRAM N/A 11/16/2012   Procedure: LEFT HEART CATHETERIZATION WITH CORONARY ANGIOGRAM;  Surgeon: Sherren Mocha, MD;  Location: [facility] CATH LAB;  Service: Cardiovascular;  Laterality: N/A;   PERCUTANEOUS CORONARY STENT INTERVENTION (PCI-S)  11/16/2012   Procedure: PERCUTANEOUS CORONARY STENT INTERVENTION (PCI-S);  Surgeon: Sherren Mocha, MD;  Location: [facility] CATH LAB;  Service: Cardiovascular;;   POLYPECTOMY  07/24/2020   Procedure: POLYPECTOMY;  Surgeon: Daneil Dolin, MD;  Location: [facility];  Service: Endoscopy;;   TOTAL KNEE ARTHROPLASTY Right 11/15/2013   Procedure: RIGHT TOTAL KNEE ARTHROPLASTY;  Surgeon: Mauri Pole, MD;  Location: [facility] ORS;  Service: Orthopedics;  Laterality: Right;   TOTAL KNEE ARTHROPLASTY Left 03/07/2015   Procedure: LEFT TOTAL KNEE ARTHROPLASTY;  Surgeon: Paralee Cancel, MD;  Location: [facility] ORS;  Service: Orthopedics;  Laterality: Left;    Social History:  reports that he quit smoking about 29 years ago. His smoking use included cigarettes. He started smoking about 57 years ago. He has a 30.00 pack-year smoking history. He has never used smokeless tobacco. He reports that he does not drink alcohol and does not use drugs.   Allergies  Allergen Reactions   Metformin    Niacin    Niacin And Related Other (See Comments)    Reaction: flushing and burning side effects even with extended release    Family History  Problem Relation Age of Onset   Hypertension Other    Cancer Other    Heart disease Other    Diabetes Other    Hypertension Mother    Cancer Father        brain   CAD Brother    Colon cancer Neg Hx      Prior to Admission medications   Medication Sig Start Date End Date Taking? Authorizing Provider  acarbose (PRECOSE) 50 MG tablet Take 50 mg by mouth in the morning, at noon, and at bedtime.  10/20/17  Yes [provider]  acetaminophen (TYLENOL) 650 MG CR tablet Take 1,300 mg by mouth every 8 (eight) hours as  needed for pain.   Yes [provider]  allopurinol (ZYLOPRIM) 100 MG tablet Take 100-150 mg by mouth 2 (two) times daily. Takes 1.5 tablet in am and 1 tablet in evening 08/17/20  Yes [provider]  amLODipine (NORVASC) 10 MG tablet Take 10 mg by mouth every evening.    Yes [provider]  aspirin EC 81 MG tablet Take 1 tablet (81 mg total) by mouth daily. 09/14/19  Yes Herminio Commons, MD  calcitRIOL (ROCALTROL) 0.25 MCG capsule Take 0.25 mcg by mouth every Sunday.  12/13/16  Yes [provider]  carvedilol (COREG) 6.25 MG tablet Take 6.25 mg by mouth 2 (two) times daily with a meal. 04/18/22 04/18/23 Yes [provider]  cholecalciferol (VITAMIN D) 1000 units tablet Take 1,000 Units daily by mouth.   Yes [provider]  cloNIDine (CATAPRES) 0.1 MG tablet Take 0.1 mg by mouth 3 (three) times daily. 02/15/20  Yes [provider]  furosemide (LASIX) 40 MG tablet Take 1 tablet (40 mg total) by mouth daily. Patient taking differently: Take 20-40 mg by mouth daily as needed for fluid. 02/07/22  Yes Johnson, Clanford L, MD  gabapentin (NEURONTIN) 300 MG capsule Take 300 mg by mouth 3 (three) times daily.  10/19/19  Yes [provider]  hydrALAZINE (APRESOLINE) 50 MG tablet Take 100 mg by mouth 2 (two) times daily. 01/24/22  Yes [provider]  insulin NPH Human (NOVOLIN N) 100 UNIT/ML injection Inject 0.18 mLs (18 Units total) into the skin every morning. Ands syringes 1/day 06/27/21  Yes Renato Shin, MD  lubiprostone (AMITIZA) 8 MCG capsule Take by mouth.   Yes [provider]  meclizine (ANTIVERT) 25 MG tablet Take 25 mg by mouth 3 (three) times daily as needed for dizziness.   Yes [provider]  nitroGLYCERIN (NITROSTAT) 0.4 MG SL tablet Place 1 tablet (0.4 mg total) under the tongue every 5 (five) minutes as needed for chest pain. PLACE ONE TABLET UNDER THE TONGUE EVERY 5 MINUTES FOR 3 DOSES AS NEEDED  Strength: 0.4 mg 02/07/22  Yes Branch, Alphonse Guild, MD  pantoprazole (PROTONIX) 40 MG tablet TAKE ONE TABLET (40MG TOTAL) BY MOUTH TWO TIMES DAILY BEFORE A MEAL Patient taking differently: Take 40 mg by mouth 2 (two) times daily. 05/11/21  Yes Erenest Rasher, PA-C  pravastatin (PRAVACHOL) 40 MG tablet Take 1 tablet (40 mg total) by mouth every evening. 07/31/21 02/09/28 Yes Imogene Burn, PA-C  dicyclomine (BENTYL) 10 MG capsule Take 10 mg by mouth 3 (three) times daily. Patient not taking: Reported on 05/09/2022    [provider]  ondansetron (ZOFRAN) 4 MG tablet TAKE ONE TABLET (4MG TOTAL) BY MOUTH EVERY 8 HOURS AS NEEDED FOR NAUSEA OR VOMITING Patient not taking: Reported on 05/09/2022 04/24/21   Annitta Needs, NP  TRUE METRIX BLOOD GLUCOSE TEST test strip USE TO TEST BLOOD SUGAR UP TO TWICE DAILY OR AS DIRECTED 08/31/21   Renato Shin, MD    Physical Exam: BP (!) 123/52   Pulse 60   Temp 98 F (36.7 C) (Oral)   Resp 14   SpO2 95%   General: 76 y.o. year-old male well developed well nourished in no acute distress.  Alert and oriented x3. HEENT: NCAT, EOMI Neck: Supple, trachea medial Cardiovascular: Regular rate and rhythm with no rubs or gallops.  No thyromegaly or JVD noted.  +2 edema in lower extremity bilaterally. 2/4 pulses in all 4 extremities. Respiratory: Clear to auscultation with no wheezes or rales. Good inspiratory effort. Abdomen: Soft, nontender nondistended with normal bowel sounds x4 quadrants. Muskuloskeletal: No cyanosis or clubbing noted bilaterally Neuro: CN II-XII intact, strength 5/5 x 4, sensation, reflexes intact Skin: No ulcerative lesions noted or rashes Psychiatry: Judgement and insight appear normal. Mood is appropriate for condition and setting          Labs on Admission:  Basic Metabolic Panel: Recent Labs  Lab 05/09/22 1958  NA 135  K 4.0  CL 101  CO2 23  GLUCOSE 166*  BUN 80*  CREATININE 4.12*  CALCIUM 9.0   Liver Function  Tests: No results for input(s): AST, ALT, ALKPHOS, BILITOT, PROT, ALBUMIN in the last 168 hours. No results for input(s): LIPASE, AMYLASE in the last 168 hours. No results for input(s): AMMONIA in the last 168 hours. CBC: Recent Labs  Lab 05/09/22 1958  WBC 5.4  HGB 8.7*  HCT 26.4*  MCV 98.9  PLT 192  Cardiac Enzymes: No results for input(s): CKTOTAL, CKMB, CKMBINDEX, TROPONINI in the last 168 hours.  BNP (last 3 results) No results for input(s): BNP in the last 8760 hours.  ProBNP (last 3 results) No results for input(s): PROBNP in the last 8760 hours.  CBG: No results for input(s): GLUCAP in the last 168 hours.  Radiological Exams on Admission: DG Chest 2 View  Result Date: 05/09/2022 CLINICAL DATA:  Chest pain starting today.  Shortness of breath. EXAM: CHEST - 2 VIEW COMPARISON:  02/06/2022 FINDINGS: Stable moderate enlargement of the cardiopericardial silhouette. The patient is rotated to the right on today's radiograph, reducing diagnostic sensitivity and specificity. Linear subsegmental atelectasis or scarring at the right lung base. Minimal blunting of the left posterior costophrenic angle possibly from scarring or trace pleural effusion. Thoracic spondylosis. IMPRESSION: 1. Subsegmental atelectasis at the right lung base. Minimal blunting left posterior costophrenic angle potentially from scarring or trace pleural effusion. 2. Stable moderate enlargement of the cardiopericardial silhouette. Electronically Signed   By: Van Clines M.D.   On: 05/09/2022 20:31    EKG: I independently viewed the EKG done and my findings are as followed: Normal sinus rhythm at a rate of 67 bpm with P8 PVCs and incomplete LBBB  Assessment/Plan Present on Admission:  Chest pain  GERD (gastroesophageal reflux disease)  Gout  Chronic diastolic heart failure ([place])  Essential hypertension  Principal Problem:   Chest pain Active Problems:   Essential hypertension   CAD S/P  percutaneous coronary angioplasty   Acute kidney injury superimposed on chronic kidney disease (HCC)   Gout   Mixed hyperlipidemia   GERD (gastroesophageal reflux disease)   Chronic diastolic heart failure (HCC)   Diabetic neuropathy (HCC)   Elevated troponin   Type 2 diabetes mellitus with hyperglycemia (HCC)  Chest pain rule out ACS Cardiovascular risk factors include hypertension, hyperlipidemia, type 2 diabetes mellitus, CHF, CAD Continue telemetry  Troponins x2 -25 > 32 EKG personally reviewed showed normal sinus rhythm at a rate of 67 bpm with P8 PVCs and incomplete LBBB Cardiology will be consulted to help decide if Stress test is needed in am Versus other  diagnostic modalities.    Give aspirin, nitroglycerin prn  Elevated troponin possibly due to type II demand ischemia Troponins x2 -25 > 32; chest pain has since improved Continue to trend troponin  Acute kidney injury on CKD stage IV BUN/creatinine 80/4.12 (baseline creatinine at 2.6-2.8) Continue gentle hydration Renally adjust medications, avoid nephrotoxic agents/dehydration/hypotension  Type 2 diabetes mellitus with hyperglycemia Continue ISS and hypoglycemia protocol Continue Semglee 8 units nightly and adjust dose accordingly  GERD Continue Protonix  Essential hypertension Continue amlodipine, Coreg, hydralazine  Mixed hyperlipidemia Continue protocol  Diabetic neuropathy Continue Neurontin  Gout Continue allopurinol  CAD s/p stent placement Continue aspirin, nitroglycerin, Pravachol, Coreg  Chronic diastolic CHF Echocardiogram done on 02/07/2022 showed LVEF of 60 to 65%.  Moderate LVH.  LV diastolic parameters indeterminate. Continue total input/output, daily weights and fluid restriction Continue Cardiac diet    DVT prophylaxis: Heparin subcu  Code Status: Full code  Consults: Cardiology  Family Communication: None at bedside  Severity of Illness: The appropriate patient status for this  patient is INPATIENT. Inpatient status is judged to be reasonable and necessary in order to provide the required intensity of service to ensure the patient's safety. The patient's presenting symptoms, physical exam findings, and initial radiographic and laboratory data in the context of their chronic comorbidities is felt to place them at high  risk for further clinical deterioration. Furthermore, it is not anticipated that the patient will be medically stable for discharge from the hospital within 2 midnights of admission.   * I certify that at the point of admission it is my clinical judgment that the patient will require inpatient hospital care spanning beyond 2 midnights from the point of admission due to high intensity of service, high risk for further deterioration and high frequency of surveillance required.*  Author: Bernadette Hoit, DO 05/10/2022 3:00 AM  For on call review [email or web address].

## 2022-05-10 DIAGNOSIS — Z9861 Coronary angioplasty status: Secondary | ICD-10-CM | POA: Diagnosis not present

## 2022-05-10 DIAGNOSIS — K219 Gastro-esophageal reflux disease without esophagitis: Secondary | ICD-10-CM | POA: Diagnosis not present

## 2022-05-10 DIAGNOSIS — E782 Mixed hyperlipidemia: Secondary | ICD-10-CM | POA: Diagnosis not present

## 2022-05-10 DIAGNOSIS — M1000 Idiopathic gout, unspecified site: Secondary | ICD-10-CM | POA: Diagnosis not present

## 2022-05-10 DIAGNOSIS — I5032 Chronic diastolic (congestive) heart failure: Secondary | ICD-10-CM | POA: Diagnosis not present

## 2022-05-10 DIAGNOSIS — E1165 Type 2 diabetes mellitus with hyperglycemia: Secondary | ICD-10-CM | POA: Diagnosis not present

## 2022-05-10 DIAGNOSIS — I10 Essential (primary) hypertension: Secondary | ICD-10-CM | POA: Diagnosis not present

## 2022-05-10 DIAGNOSIS — R079 Chest pain, unspecified: Secondary | ICD-10-CM | POA: Diagnosis not present

## 2022-05-10 DIAGNOSIS — I2510 Atherosclerotic heart disease of native coronary artery without angina pectoris: Secondary | ICD-10-CM | POA: Diagnosis not present

## 2022-05-10 DIAGNOSIS — R778 Other specified abnormalities of plasma proteins: Secondary | ICD-10-CM | POA: Diagnosis not present

## 2022-05-10 DIAGNOSIS — R7989 Other specified abnormal findings of blood chemistry: Secondary | ICD-10-CM

## 2022-05-10 DIAGNOSIS — E1140 Type 2 diabetes mellitus with diabetic neuropathy, unspecified: Secondary | ICD-10-CM

## 2022-05-10 DIAGNOSIS — E1142 Type 2 diabetes mellitus with diabetic polyneuropathy: Secondary | ICD-10-CM | POA: Diagnosis not present

## 2022-05-10 DIAGNOSIS — N179 Acute kidney failure, unspecified: Secondary | ICD-10-CM | POA: Diagnosis not present

## 2022-05-10 DIAGNOSIS — N189 Chronic kidney disease, unspecified: Secondary | ICD-10-CM | POA: Diagnosis not present

## 2022-05-10 LAB — COMPREHENSIVE METABOLIC PANEL
ALT: 15 U/L (ref 0–44)
AST: 20 U/L (ref 15–41)
Albumin: 3.8 g/dL (ref 3.5–5.0)
Alkaline Phosphatase: 56 U/L (ref 38–126)
Anion gap: 12 (ref 5–15)
BUN: 79 mg/dL — ABNORMAL HIGH (ref 8–23)
CO2: 24 mmol/L (ref 22–32)
Calcium: 9.2 mg/dL (ref 8.9–10.3)
Chloride: 104 mmol/L (ref 98–111)
Creatinine, Ser: 3.76 mg/dL — ABNORMAL HIGH (ref 0.61–1.24)
GFR, Estimated: 16 mL/min — ABNORMAL LOW (ref >60)
Glucose, Bld: 126 mg/dL — ABNORMAL HIGH (ref 70–99)
Potassium: 3.9 mmol/L (ref 3.5–5.1)
Sodium: 140 mmol/L (ref 135–145)
Total Bilirubin: 0.3 mg/dL (ref 0.3–1.2)
Total Protein: 7.1 g/dL (ref 6.5–8.1)

## 2022-05-10 LAB — CBC
HCT: 26.1 % — ABNORMAL LOW (ref 39.0–52.0)
Hemoglobin: 8.5 g/dL — ABNORMAL LOW (ref 13.0–17.0)
MCH: 32.7 pg (ref 26.0–34.0)
MCHC: 32.6 g/dL (ref 30.0–36.0)
MCV: 100.4 fL — ABNORMAL HIGH (ref 80.0–100.0)
Platelets: 186 10*3/uL (ref 150–400)
RBC: 2.6 MIL/uL — ABNORMAL LOW (ref 4.22–5.81)
RDW: 13.8 % (ref 11.5–15.5)
WBC: 6.6 10*3/uL (ref 4.0–10.5)
nRBC: 0 % (ref 0.0–0.2)

## 2022-05-10 LAB — GLUCOSE, CAPILLARY
Glucose-Capillary: 143 mg/dL — ABNORMAL HIGH (ref 70–99)
Glucose-Capillary: 158 mg/dL — ABNORMAL HIGH (ref 70–99)
Glucose-Capillary: 163 mg/dL — ABNORMAL HIGH (ref 70–99)

## 2022-05-10 LAB — LIPID PANEL
Cholesterol: 99 mg/dL (ref 0–200)
HDL: 29 mg/dL — ABNORMAL LOW (ref >40)
LDL Cholesterol: 46 mg/dL (ref 0–99)
Total CHOL/HDL Ratio: 3.4 RATIO
Triglycerides: 121 mg/dL (ref <150)
VLDL: 24 mg/dL (ref 0–40)

## 2022-05-10 LAB — TSH: TSH: 3.603 u[IU]/mL (ref 0.350–4.500)

## 2022-05-10 LAB — CBG MONITORING, ED: Glucose-Capillary: 134 mg/dL — ABNORMAL HIGH (ref 70–99)

## 2022-05-10 LAB — APTT: aPTT: 30 seconds (ref 24–36)

## 2022-05-10 LAB — MRSA NEXT GEN BY PCR, NASAL: MRSA by PCR Next Gen: NOT DETECTED

## 2022-05-10 LAB — TROPONIN I (HIGH SENSITIVITY): Troponin I (High Sensitivity): 39 ng/L — ABNORMAL HIGH (ref <18)

## 2022-05-10 LAB — PHOSPHORUS: Phosphorus: 4.2 mg/dL (ref 2.5–4.6)

## 2022-05-10 LAB — MAGNESIUM: Magnesium: 2.2 mg/dL (ref 1.7–2.4)

## 2022-05-10 MED ORDER — GABAPENTIN 300 MG PO CAPS
300.0000 mg | ORAL_CAPSULE | Freq: Three times a day (TID) | ORAL | Status: DC
  Administered 2022-05-10 – 2022-05-11 (×4): 300 mg via ORAL
  Filled 2022-05-10 (×4): qty 1

## 2022-05-10 MED ORDER — ALLOPURINOL 100 MG PO TABS
100.0000 mg | ORAL_TABLET | Freq: Two times a day (BID) | ORAL | Status: DC
  Administered 2022-05-10 (×2): 150 mg via ORAL
  Administered 2022-05-11: 100 mg via ORAL
  Filled 2022-05-10: qty 1
  Filled 2022-05-10 (×2): qty 2

## 2022-05-10 MED ORDER — PANTOPRAZOLE SODIUM 40 MG PO TBEC
40.0000 mg | DELAYED_RELEASE_TABLET | Freq: Two times a day (BID) | ORAL | Status: DC
  Administered 2022-05-10 – 2022-05-11 (×3): 40 mg via ORAL
  Filled 2022-05-10 (×4): qty 1

## 2022-05-10 MED ORDER — INSULIN ASPART 100 UNIT/ML IJ SOLN
0.0000 [IU] | Freq: Three times a day (TID) | INTRAMUSCULAR | Status: DC
  Administered 2022-05-10 (×2): 2 [IU] via SUBCUTANEOUS
  Administered 2022-05-10: 3 [IU] via SUBCUTANEOUS
  Administered 2022-05-11: 2 [IU] via SUBCUTANEOUS
  Filled 2022-05-10: qty 1

## 2022-05-10 MED ORDER — ISOSORBIDE MONONITRATE ER 30 MG PO TB24
30.0000 mg | ORAL_TABLET | Freq: Every day | ORAL | Status: DC
  Administered 2022-05-10 – 2022-05-11 (×2): 30 mg via ORAL
  Filled 2022-05-10 (×2): qty 1

## 2022-05-10 MED ORDER — NITROGLYCERIN 0.4 MG SL SUBL
0.4000 mg | SUBLINGUAL_TABLET | SUBLINGUAL | Status: DC | PRN
  Administered 2022-05-10: 0.4 mg via SUBLINGUAL
  Filled 2022-05-10: qty 1

## 2022-05-10 MED ORDER — AMLODIPINE BESYLATE 5 MG PO TABS
10.0000 mg | ORAL_TABLET | Freq: Every evening | ORAL | Status: DC
  Administered 2022-05-10: 10 mg via ORAL
  Filled 2022-05-10: qty 2

## 2022-05-10 MED ORDER — ASPIRIN 81 MG PO TBEC
81.0000 mg | DELAYED_RELEASE_TABLET | Freq: Every day | ORAL | Status: DC
Start: 2022-05-10 — End: 2022-05-11
  Administered 2022-05-10 – 2022-05-11 (×2): 81 mg via ORAL
  Filled 2022-05-10 (×2): qty 1

## 2022-05-10 MED ORDER — CHLORHEXIDINE GLUCONATE CLOTH 2 % EX PADS
6.0000 | MEDICATED_PAD | Freq: Every day | CUTANEOUS | Status: DC
  Administered 2022-05-10 – 2022-05-11 (×2): 6 via TOPICAL

## 2022-05-10 MED ORDER — CARVEDILOL 3.125 MG PO TABS
6.2500 mg | ORAL_TABLET | Freq: Two times a day (BID) | ORAL | Status: DC
  Administered 2022-05-10 – 2022-05-11 (×3): 6.25 mg via ORAL
  Filled 2022-05-10 (×3): qty 2

## 2022-05-10 MED ORDER — INSULIN ASPART 100 UNIT/ML IJ SOLN: 0.0000 [IU] | Freq: Every day | INTRAMUSCULAR | Status: DC

## 2022-05-10 MED ORDER — MECLIZINE HCL 12.5 MG PO TABS
25.0000 mg | ORAL_TABLET | Freq: Three times a day (TID) | ORAL | Status: DC | PRN
  Administered 2022-05-10: 25 mg via ORAL
  Filled 2022-05-10: qty 2

## 2022-05-10 MED ORDER — PRAVASTATIN SODIUM 40 MG PO TABS
40.0000 mg | ORAL_TABLET | Freq: Every evening | ORAL | Status: DC
  Administered 2022-05-10: 40 mg via ORAL
  Filled 2022-05-10: qty 1

## 2022-05-10 MED ORDER — SODIUM CHLORIDE 0.9 % IV SOLN: INTRAVENOUS | Status: DC

## 2022-05-10 MED ORDER — HYDRALAZINE HCL 25 MG PO TABS
100.0000 mg | ORAL_TABLET | Freq: Two times a day (BID) | ORAL | Status: DC
  Administered 2022-05-10 – 2022-05-11 (×3): 100 mg via ORAL
  Filled 2022-05-10 (×3): qty 4

## 2022-05-10 MED ORDER — INSULIN GLARGINE-YFGN 100 UNIT/ML ~~LOC~~ SOLN
8.0000 [IU] | Freq: Every day | SUBCUTANEOUS | Status: DC
  Administered 2022-05-10: 8 [IU] via SUBCUTANEOUS
  Filled 2022-05-10 (×2): qty 0.08

## 2022-05-10 NOTE — Consult Note (Addendum)
Cardiology Consultation:   Patient ID: [name] MRN: [id number]; DOB: 1946/03/20  Admit date: 05/09/2022 Date of Consult: 05/10/2022  PCP:  Redmond School, MD   [facility] Providers Cardiologist:  Carlyle Dolly, MD        Patient Profile:   [name] is a 76 y.o. male with a hx of CAD (s/p DES to LAD and D1 in 10/2012 with nonobstructive disease along RCA and LCx), HFpEF, HTN, HLD and Stage 4 CKD who is being seen 05/10/2022 for the evaluation of chest pain at the request of Dr. Josephine Cables.  History of Present Illness:   Mr. Hoadley was examined by Dr. Harl Bowie in 01/2022 for hospital follow-up from a recent admission for chest pain and reported occasional episodes of exertional chest pain but no persistent symptoms. His ARB had previously been discontinued by Nephrology given his worsening renal function and he was started on Hydralazine. He was continued on his current cardiac medications at that time including Amlodipine 10 mg daily, ASA 81 mg daily, Clonidine 0.1 mg 3 times daily, Lasix 40 mg daily, Hydralazine 50 mg 3 times daily and Pravastatin 40 mg daily.  He walked into the office in 03/2022 reporting chest pain and refused to go to the ED for evaluation. Dr. Harl Bowie recommended a [facility] for further evaluation and this was performed earlier this week on 05/06/2022 and showed findings consistent with prior inferior infarct and a small apical infarct with mild peri-infarct ischemia but was overall felt to be a low risk study.  EF was preserved at 55%.  He presented to the ED on 05/09/2022 for evaluation of chest pain and dyspnea on exertion. Reports he has been having chest pain with activity for the past few weeks but his symptoms worsened the day of admission. He did try taking nitroglycerin with some improvement in symptoms but had recurrent pain.  ays that his breathing has been stable and denies any specific dyspnea on exertion, orthopnea or PND. He does  experience intermittent lower extremity edema and says Nephrology temporarily doubled his Lasix for several days to help with this.  He reports having chronic back pain and says he is scheduled for an upcoming spinal stimulator surgery and is hopeful his pain will improve so he can resume golfing.   Initial labs showed WBC 5.4, Hgb 8.7, platelets 192, Na+ 135, K+ 4.0 and creatinine 4.12 (baseline 2.5 - 2.8). Initial and repeat Hs Troponin values have been flat at 25, 32 and 39 which is similar to his admission in 01/2022. CXR shows subsegmental atelectasis at the right lung base with minimal blunting at the left posterior costophrenic angle suggesting scarring or trace pleural effusion. EKG shows NSR, HR 67 with 1st degree AV block and PVC's and incomplete LBBB.    He did receive a 500 ML fluid bolus on admission and IV fluids overnight with creatinine having improved to 3.76 this AM.    Past Medical History:  Diagnosis Date   Anxiety    Benign prostatic hypertrophy    Nocturia   CAD (coronary artery disease)    a.  NSTEMI 10/2012 s/p DES to LAD & DES to 1st diagonal with residual diffuse nonobstructive dz in LCx/RCA    Chronic kidney disease, stage 3, mod decreased GFR (HCC)    Creatinine of 1.5 in 03/2009, STAGE 4 now   Degenerative joint disease    Depression    hx of   Diabetes mellitus, type II ([place]) 12/10/2012.   Diastolic heart  failure    Pedal edema, "a long time ago"   Erectile dysfunction    Flu 2013   hx of   GERD (gastroesophageal reflux disease)    Gout    Hemorrhoids    High cholesterol    Hypertension    Exercise induced   Myocardial infarction (HCC)    NSVT (nonsustained ventricular tachycardia) ([place])    Exercise induced   Obesity    Tobacco abuse, 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, quit 1994   Tubular adenoma 2014    Past Surgical History:  Procedure Laterality Date   BACK SURGERY  2007   x2   BIOPSY  02/05/2021   Procedure: BIOPSY;  Surgeon: Daneil Dolin,  MD;  Location: [facility];  Service: Endoscopy;;   CARDIAC CATHETERIZATION     with stent placement   COLONOSCOPY WITH ESOPHAGOGASTRODUODENOSCOPY (EGD) N/A 11/23/2013   Dr. Oneida Alar- TCS= left sided colitis and mild proctitis likely due to ischemic colitis, moderate internal hemorrhoids, small nodule in the rectum, bx=tubular adenoma, EGD=-mild non-erosive gastritis   COLONOSCOPY WITH PROPOFOL N/A 07/24/2020   hemorrhoids,  rectosigmoid lipoma, small adenoma removed.   ESOPHAGOGASTRODUODENOSCOPY (EGD) WITH PROPOFOL N/A 02/05/2021   Normal esophagus, erythematous mucosa in entire stomach, no obvious ulcer, s/p biopsy. Normal duodenum. Negative H.pylori.    HERNIA REPAIR     LEFT HEART CATHETERIZATION WITH CORONARY ANGIOGRAM N/A 11/16/2012   Procedure: LEFT HEART CATHETERIZATION WITH CORONARY ANGIOGRAM;  Surgeon: Sherren Mocha, MD;  Location: [facility] CATH LAB;  Service: Cardiovascular;  Laterality: N/A;   PERCUTANEOUS CORONARY STENT INTERVENTION (PCI-S)  11/16/2012   Procedure: PERCUTANEOUS CORONARY STENT INTERVENTION (PCI-S);  Surgeon: Sherren Mocha, MD;  Location: [facility] CATH LAB;  Service: Cardiovascular;;   POLYPECTOMY  07/24/2020   Procedure: POLYPECTOMY;  Surgeon: Daneil Dolin, MD;  Location: [facility];  Service: Endoscopy;;   TOTAL KNEE ARTHROPLASTY Right 11/15/2013   Procedure: RIGHT TOTAL KNEE ARTHROPLASTY;  Surgeon: Mauri Pole, MD;  Location: [facility] ORS;  Service: Orthopedics;  Laterality: Right;   TOTAL KNEE ARTHROPLASTY Left 03/07/2015   Procedure: LEFT TOTAL KNEE ARTHROPLASTY;  Surgeon: Paralee Cancel, MD;  Location: [facility] ORS;  Service: Orthopedics;  Laterality: Left;     Home Medications:  Prior to Admission medications   Medication Sig Start Date End Date Taking? Authorizing Provider  acarbose (PRECOSE) 50 MG tablet Take 50 mg by mouth in the morning, at noon, and at bedtime.  10/20/17  Yes [provider]  acetaminophen (TYLENOL) 650 MG CR tablet Take 1,300 mg by mouth every 8  (eight) hours as needed for pain.   Yes [provider]  allopurinol (ZYLOPRIM) 100 MG tablet Take 100-150 mg by mouth 2 (two) times daily. Takes 1.5 tablet in am and 1 tablet in evening 08/17/20  Yes [provider]  amLODipine (NORVASC) 10 MG tablet Take 10 mg by mouth every evening.    Yes [provider]  aspirin EC 81 MG tablet Take 1 tablet (81 mg total) by mouth daily. 09/14/19  Yes Herminio Commons, MD  calcitRIOL (ROCALTROL) 0.25 MCG capsule Take 0.25 mcg by mouth every Sunday.  12/13/16  Yes [provider]  carvedilol (COREG) 6.25 MG tablet Take 6.25 mg by mouth 2 (two) times daily with a meal. 04/18/22 04/18/23 Yes [provider]  cholecalciferol (VITAMIN D) 1000 units tablet Take 1,000 Units daily by mouth.   Yes [provider]  cloNIDine (CATAPRES) 0.1 MG tablet Take 0.1 mg by  mouth 3 (three) times daily. 02/15/20  Yes [provider]  furosemide (LASIX) 40 MG tablet Take 1 tablet (40 mg total) by mouth daily. Patient taking differently: Take 20-40 mg by mouth daily as needed for fluid. 02/07/22  Yes Johnson, Clanford L, MD  gabapentin (NEURONTIN) 300 MG capsule Take 300 mg by mouth 3 (three) times daily.  10/19/19  Yes [provider]  hydrALAZINE (APRESOLINE) 50 MG tablet Take 100 mg by mouth 2 (two) times daily. 01/24/22  Yes [provider]  insulin NPH Human (NOVOLIN N) 100 UNIT/ML injection Inject 0.18 mLs (18 Units total) into the skin every morning. Ands syringes 1/day 06/27/21  Yes Renato Shin, MD  lubiprostone (AMITIZA) 8 MCG capsule Take by mouth.   Yes [provider]  meclizine (ANTIVERT) 25 MG tablet Take 25 mg by mouth 3 (three) times daily as needed for dizziness.   Yes [provider]  nitroGLYCERIN (NITROSTAT) 0.4 MG SL tablet Place 1 tablet (0.4 mg total) under the tongue every 5 (five) minutes as needed for chest pain. PLACE ONE TABLET UNDER THE TONGUE EVERY 5 MINUTES FOR  3 DOSES AS NEEDED Strength: 0.4 mg 02/07/22  Yes Branch, Alphonse Guild, MD  pantoprazole (PROTONIX) 40 MG tablet TAKE ONE TABLET (40MG TOTAL) BY MOUTH TWO TIMES DAILY BEFORE A MEAL Patient taking differently: Take 40 mg by mouth 2 (two) times daily. 05/11/21  Yes Erenest Rasher, PA-C  pravastatin (PRAVACHOL) 40 MG tablet Take 1 tablet (40 mg total) by mouth every evening. 07/31/21 02/09/28 Yes Imogene Burn, PA-C  dicyclomine (BENTYL) 10 MG capsule Take 10 mg by mouth 3 (three) times daily. Patient not taking: Reported on 05/09/2022    [provider]  ondansetron (ZOFRAN) 4 MG tablet TAKE ONE TABLET (4MG TOTAL) BY MOUTH EVERY 8 HOURS AS NEEDED FOR NAUSEA OR VOMITING Patient not taking: Reported on 05/09/2022 04/24/21   Annitta Needs, NP  TRUE METRIX BLOOD GLUCOSE TEST test strip USE TO TEST BLOOD SUGAR UP TO TWICE DAILY OR AS DIRECTED 08/31/21   Renato Shin, MD    Inpatient Medications: Scheduled Meds:  allopurinol  100-150 mg Oral BID   amLODipine  10 mg Oral QPM   aspirin EC  81 mg Oral Daily   carvedilol  6.25 mg Oral BID WC   gabapentin  300 mg Oral TID   heparin  5,000 Units Subcutaneous Q8H   hydrALAZINE  100 mg Oral BID   insulin aspart  0-15 Units Subcutaneous TID WC   insulin aspart  0-5 Units Subcutaneous QHS   insulin glargine-yfgn  8 Units Subcutaneous QHS   isosorbide mononitrate  30 mg Oral Daily   pantoprazole  40 mg Oral BID   pravastatin  40 mg Oral QPM   Continuous Infusions:   PRN Meds: acetaminophen **OR** acetaminophen, nitroGLYCERIN  Allergies:    Allergies  Allergen Reactions   Metformin    Niacin    Niacin And Related Other (See Comments)    Reaction: flushing and burning side effects even with extended release    Social History:   Social History   Socioeconomic History   Marital status: Widowed    Spouse name: Not on file   Number of children: Not on file   Years of education: Not on file   Highest education level: Not on file   Occupational History   Occupation: Disabled    Employer: RETIRED  Tobacco Use   Smoking status: Former    Packs/day: 1.00  Years: 30.00    Pack years: 30.00    Types: Cigarettes    Start date: 01/22/1965    Quit date: 12/23/1992    Years since quitting: 29.3   Smokeless tobacco: Never  Vaping Use   Vaping Use: Never used  Substance and Sexual Activity   Alcohol use: No    Alcohol/week: 0.0 standard drinks   Drug use: No   Sexual activity: Not Currently  Other Topics Concern   Not on file  Social History Narrative   Married   Right handed   One story home no caffeine   Social Determinants of Health   Financial Resource Strain: Not on file  Food Insecurity: Not on file  Transportation Needs: Not on file  Physical Activity: Not on file  Stress: Not on file  Social Connections: Not on file  Intimate Partner Violence: Not on file    Family History:    Family History  Problem Relation Age of Onset   Hypertension Other    Cancer Other    Heart disease Other    Diabetes Other    Hypertension Mother    Cancer Father        brain   CAD Brother    Colon cancer Neg Hx      ROS:  Please see the history of present illness.   All other ROS reviewed and negative.     Physical Exam/Data:   Vitals:   05/10/22 0400 05/10/22 0500 05/10/22 0745 05/10/22 0750  BP: (!) 120/48 (!) 126/51    Pulse: (!) 59 61 63 61  Resp: 17 18 18 17   Temp:      TempSrc:      SpO2: 92% 100% 95% 95%   No intake or output data in the 24 hours ending 05/10/22 0812    04/18/2022    3:59 PM 02/08/2022    1:46 PM 02/06/2022    3:43 PM  Last 3 Weights  Weight (lbs) 276 lb 272 lb 12.8 oz 265 lb  Weight (kg) 125.193 kg 123.741 kg 120.203 kg     There is no height or weight on file to calculate BMI.  General:  Well nourished, well developed, in no acute distress. HEENT: normal Neck: no JVD Vascular: No carotid bruits; Distal pulses 2+ bilaterally Cardiac:  normal S1, S2; RRR; no  murmur. Lungs:  clear to auscultation bilaterally, no wheezing, rhonchi or rales  Abd: soft, nontender, no hepatomegaly  Ext: chronic appearing pitting edema Musculoskeletal:  No deformities, BUE and BLE strength normal and equal Skin: warm and dry  Neuro:  CNs 2-12 intact, no focal abnormalities noted Psych:  Normal affect   EKG:  The EKG was personally reviewed and demonstrates: NSR, HR 67 with 1st degree AV block and PVC's and incomplete LBBB.     Relevant CV Studies:  Echocardiogram: 01/2022 IMPRESSIONS     1. Left ventricular ejection fraction, by estimation, is 60 to 65%. The  left ventricle has normal function. The left ventricle has no regional  wall motion abnormalities. There is moderate left ventricular hypertrophy.  Left ventricular diastolic  parameters are indeterminate.   2. Right ventricular systolic function is normal. The right ventricular  size is normal.   3. Left atrial size was moderately dilated.   4. The mitral valve is normal in structure. No evidence of mitral valve  regurgitation. No evidence of mitral stenosis.   5. The aortic valve was not well visualized. There is moderate  calcification of the  aortic valve. There is moderate thickening of the  aortic valve. Aortic valve regurgitation is not visualized. Aortic valve  sclerosis is present, with no evidence of  aortic valve stenosis.   6. The inferior vena cava is normal in size with greater than 50%  respiratory variability, suggesting right atrial pressure of 3 mmHg.  NST: 04/2022   Findings are consistent with prior inferior myocardial infarction. There is a small apical infarct with mild peri-infarct ischemia.  The study is low risk.   No ST deviation was noted.   LV perfusion is abnormal.   Left ventricular function is normal. Nuclear stress EF: 55 %. The left ventricular ejection fraction is normal (55-65%). End diastolic cavity size is severely enlarged.  Laboratory Data:  High  Sensitivity Troponin:   Recent Labs  Lab 05/09/22 1958 05/09/22 2206 05/10/22 0340  TROPONINIHS 25* 32* 39*     Chemistry Recent Labs  Lab 05/09/22 1958 05/10/22 0340  NA 135 140  K 4.0 3.9  CL 101 104  CO2 23 24  GLUCOSE 166* 126*  BUN 80* 79*  CREATININE 4.12* 3.76*  CALCIUM 9.0 9.2  MG  --  2.2  GFRNONAA 14* 16*  ANIONGAP 11 12    Recent Labs  Lab 05/10/22 0340  PROT 7.1  ALBUMIN 3.8  AST 20  ALT 15  ALKPHOS 56  BILITOT 0.3   Lipids No results for input(s): CHOL, TRIG, HDL, LABVLDL, LDLCALC, CHOLHDL in the last 168 hours.  Hematology Recent Labs  Lab 05/09/22 1958 05/10/22 0340  WBC 5.4 6.6  RBC 2.67* 2.60*  HGB 8.7* 8.5*  HCT 26.4* 26.1*  MCV 98.9 100.4*  MCH 32.6 32.7  MCHC 33.0 32.6  RDW 13.5 13.8  PLT 192 186   Thyroid No results for input(s): TSH, FREET4 in the last 168 hours.  BNPNo results for input(s): BNP, PROBNP in the last 168 hours.  DDimer No results for input(s): DDIMER in the last 168 hours.   Radiology/Studies:  DG Chest 2 View  Result Date: 05/09/2022 CLINICAL DATA:  Chest pain starting today.  Shortness of breath. EXAM: CHEST - 2 VIEW COMPARISON:  02/06/2022 FINDINGS: Stable moderate enlargement of the cardiopericardial silhouette. The patient is rotated to the right on today's radiograph, reducing diagnostic sensitivity and specificity. Linear subsegmental atelectasis or scarring at the right lung base. Minimal blunting of the left posterior costophrenic angle possibly from scarring or trace pleural effusion. Thoracic spondylosis. IMPRESSION: 1. Subsegmental atelectasis at the right lung base. Minimal blunting left posterior costophrenic angle potentially from scarring or trace pleural effusion. 2. Stable moderate enlargement of the cardiopericardial silhouette. Electronically Signed   By: Van Clines M.D.   On: 05/09/2022 20:31   NM Myocar Multi W/Spect W/Wall Motion / EF  Result Date: 05/06/2022   Findings are consistent  with prior inferior myocardial infarction. There is a small apical infarct with mild peri-infarct ischemia.  The study is low risk.   No ST deviation was noted.   LV perfusion is abnormal.   Left ventricular function is normal. Nuclear stress EF: 55 %. The left ventricular ejection fraction is normal (55-65%). End diastolic cavity size is severely enlarged.     Assessment and Plan:   1. Chest Pain consistent with Angina - He reports episodes of chest pain over the past several months which occur with activity and improve with rest. Recent NST showed finding consistent with prior infarct and a small apical infarct with mild peri-infarct ischemia but was overall a  low risk study. - Troponin values this admission have been flat at 25, 32 and 39 which is similar to values during his prior admissions. EKG shows PVC's and incomplete LBBB.  Reviewed with the patient extensively that he is not an ideal candidate for a cardiac catheterization given his renal function as he would be at high-risk for contrast-induced nephropathy. He voiced understanding of this. Reviewed at this time that the best option would be medical therapy. - He is already on ASA 81 mg daily, Coreg 6.25 mg twice daily and Pravastatin 40 mg daily. Given that his pain is responsive to nitroglycerin, will start Imdur 30 mg daily which can be titrated as needed.  2. CAD - He is s/p DES to LAD and D1 in 10/2012 with nonobstructive disease along RCA and LCx. Continue ASA, BB and statin therapy. Will start Imdur as discussed above.   3. HFpEF - Echocardiogram in 01/2022 showed a preserved EF of 60 to 65% with no regional wall motion abnormalities. Diuretic therapy has been deferred to Nephrology in the outpatient setting given his advanced CKD. He was taking Lasix 80 mg daily prior to admission given recent dose adjustments by Nephrology and given his worsening renal function, would anticipate discharging on 40 mg daily with close follow-up with  Nephrology recommended.    3. HTN - His BP has overall been well-controlled, at 126/51 on most recent check. Remains on Coreg, Amlodipine and Hydralazine.   4. HLD - No recent FLP on file. Will add-on to AM labs.  He remains on Pravastatin 40 mg daily. If LDL is not at goal, would recommend transitioning to Crestor 20 mg daily.   5. Stage 4 CKD  - Baseline creatinine 2.5 - 2.8.Elevated to 4.12 on admission and improved to 3.76 today.   6. Anemia - Hgb at 8.5 this AM and likely due to chronic disease but this has significantly declined over the past few months as previously at 11 - 12 in 2022. No reports of active bleeding. Management per the admitting team.    Risk Assessment/Risk Scores:     HEAR Score (for undifferentiated chest pain):  HEAR Score: 6  For questions or updates, please contact [place] Please consult [email or web address] for contact info under    Signed, Erma Heritage, PA-C  05/10/2022 8:12 AM  Personally seen and examined. Agree with APP above with the following comments: CAD s/p distal PCI and Stage IV CKD with no plans fo HD who presents for chest pain.  Patient notes that he is feeling better since starting IMDUR.  He has had chest pain that is intermittent much of 2023.  He had recent eval 4/23 and because of this had Lexiscan- this showed mild peri-infarct ischemia from his prior LAD infarct.   No shortness of breath, DOE .  No PND or orthopnea.  No bendopnea, weight gain, leg swelling , or abdominal swelling.  No syncope or near syncope  He has had persistent chest pain and this lead to repeat eval.  This is in the setting of his AKI (creatinine baseline 2.5-2.8)  he has expressed to me that he is not planned for dialysis.  If this is the case, we are limited in future ischemic evaluations/interventions.  Gen: no distress, tired   Neck: No JVD Cardiac: No Rubs or Gallops, no Murmur, RRR +2 radial pulses Respiratory: Clear to auscultation bilaterally,  normal effort, normal  respiratory rate GI: Soft, nontender, non-distended  MS: bilateral +1 edema;  moves all  extremities Integument: Skin feels warm Neuro:  At time of evaluation, alert and oriented to person/place/time/situation  Psych: Normal affect, patient feels better now that he is out of the ED   Would recommend - we have discussed risk of LHC and AKI, will plan for conservative management - tolerate Imdur which would also help with microvascular disease, if needed can increase to 60 mm PO daily (up to 120 mg PO daily at max), we discussed risk of HA -will need balance of low dose diuretic and f/u for kidney; I suspect lasix 40 mg PO daily and closest nephrology f/u will be needed  Rest as above  Rudean Haskell, MD Cardiologist Structural Imaging/Hypertrophic Cardiomyopathy   [facility]  [address] [place], [place] [address] [phone]  12:15 PM

## 2022-05-10 NOTE — Progress Notes (Signed)
  Transition of Care [facility]) Screening Note   Patient Details  Name: [name] Date of Birth: 04-27-1946   Transition of Care [facility]) CM/SW Contact:    Iona Beard, [place] Phone Number: 05/10/2022, 11:27 AM    Transition of Care Department [facility]) has reviewed patient and no TOC needs have been identified at this time. We will continue to monitor patient advancement through interdisciplinary progression rounds. If new patient transition needs arise, please place a TOC consult.

## 2022-05-10 NOTE — Hospital Course (Addendum)
[name] is a 76 y.o. male with medical history significant of essential hypertension, GERD, gout, CAD s/p stent placement, type II DM presented to hospital with complaints of intermittent chest pain.  Patient follows up with  Dr. Harl Bowie and had a nuclear medicine stress test done on Monday (which showed abnormal LV perfusion and small apical infarct with mild peri-infarct ischemia.  LVEF is 55%).  He complained of rapidly worsening chest pain on presentation which improved with nitroglycerin.  In the ED patient was intermittently bradycardic.  Creatinine was elevated at 4.1 from baseline at 2.6-2.8. Glucose 166, troponin x2 -25 > 32. Chest x-ray showed subsegmental atelectasis at the right lung base. Minimal blunting left posterior costophrenic angle potentially from scarring or trace pleural effusion. Stable moderate enlargement of the cardiopericardial silhouette.  Patient received nitroglycerin and IV hydration.  Patient was then considered for admission to the hospital for further evaluation and treatment.    Assessment and plan. Principal Problem:   Chest pain Active Problems:   Essential hypertension   CAD S/P percutaneous coronary angioplasty   Acute kidney injury superimposed on chronic kidney disease (HCC)   Gout   Mixed hyperlipidemia   GERD (gastroesophageal reflux disease)   Chronic diastolic heart failure (HCC)   Diabetic neuropathy (HCC)   Elevated troponin   Type 2 diabetes mellitus with hyperglycemia (HCC)   Chest pain secondary to angina Patient has history of hypertension hyperlipidemia type 2 diabetes CHF CAD and recent stress test showed some apical infarct.  Troponin mildly elevated but flat.  EKG this time with normal sinus rhythm.  Cardiology was consulted and risk of left heart cath with underlying kidney disease was discussed and at this time patient has been started on Imdur.  Continue aspirin, Coreg, statins, long-acting nitroglycerin.   Hypothermia.  Patient  had a temperature of 95.1 F.  Feels chilly and was mildly hypotensive.  We will add Bair hugger.  We will check TSH.  Transfer to stepdown unit for [facility].   History of CAD CAD s/p stent placement Continue aspirin, nitroglycerin, Pravachol, Coreg.  Cardiology consultation appreciated and at this time conservative treatment plan has been initiated with the long-acting nitrates.  Elevated troponin  Troponins x2 -25 > 32>39; recent abnormal stress test.  Continue long-acting nitrates.  Cardiology plans to increase up to 120 if possible.  Acute kidney injury on CKD stage IV BUN/creatinine 80/4.12 (baseline creatinine at 2.6-2.8).  Received IV hydration.  Avoid nephrotoxic medications.  Creatinine today at 3.7 after IV fluid hydration.  Will likely need Lasix 40 mg daily on discharge, we will continue to monitor BMP.  Type 2 diabetes mellitus with hyperglycemia Continue basal and sliding scale insulin while in the hospital   GERD Continue Protonix  Essential hypertension Continue amlodipine, Coreg, hydralazine  Mixed hyperlipidemia Continue atorvastatin  Diabetic neuropathy Continue Neurontin  Gout Continue allopurinol   Chronic diastolic CHF Review of 2D echocardiogram done on 02/07/2022 showed LVEF of 60 to 65%.  Moderate LVH.  LV diastolic parameters indeterminate.  Continue intake and output charting Daily weights

## 2022-05-10 NOTE — ED Notes (Signed)
Pt alert, NAD, calm, interactive, resps e/u, speaking in clear complete sentences, c/o frustration about wait, repeat visits, no answers, and CP, and back and general discomfort.

## 2022-05-10 NOTE — Progress Notes (Signed)
PROGRESS NOTE    SKILER OLDEN  XTK:[id number] DOB: 01-20-1946 DOA: 05/09/2022 PCP: Redmond School, MD    Brief Narrative:  [name] is a 76 y.o. male with medical history significant of essential hypertension, GERD, gout, CAD s/p stent placement, type II DM presented to hospital with complaints of intermittent chest pain.  Patient follows up with  Dr. Harl Bowie and had a nuclear medicine stress test done on Monday (which showed abnormal LV perfusion and small apical infarct with mild peri-infarct ischemia.  LVEF is 55%).  He complained of rapidly worsening chest pain on presentation which improved with nitroglycerin.  In the ED patient was intermittently bradycardic.  Creatinine was elevated at 4.1 from baseline at 2.6-2.8. Glucose 166, troponin x2 -25 > 32. Chest x-ray showed subsegmental atelectasis at the right lung base. Minimal blunting left posterior costophrenic angle potentially from scarring or trace pleural effusion. Stable moderate enlargement of the cardiopericardial silhouette.  Patient received nitroglycerin and IV hydration.  Patient was then considered for admission to the hospital for further evaluation and treatment.    Assessment and plan. Principal Problem:   Chest pain Active Problems:   Essential hypertension   CAD S/P percutaneous coronary angioplasty   Acute kidney injury superimposed on chronic kidney disease (HCC)   Gout   Mixed hyperlipidemia   GERD (gastroesophageal reflux disease)   Chronic diastolic heart failure (HCC)   Diabetic neuropathy (HCC)   Elevated troponin   Type 2 diabetes mellitus with hyperglycemia (HCC)   Chest pain secondary to angina Patient has history of hypertension hyperlipidemia type 2 diabetes CHF CAD and recent stress test showed some apical infarct.  Troponin mildly elevated but flat.  EKG this time with normal sinus rhythm.  Cardiology was consulted and risk of left heart cath with underlying kidney disease was discussed and at  this time patient has been started on Imdur.  Continue aspirin, Coreg, statins, long-acting nitroglycerin.   Hypothermia.  Patient had a temperature of 95.1 F.  Feels chilly and was mildly hypotensive.  We will add Bair hugger.  We will check TSH.  Transfer to stepdown unit for [facility].   History of CAD CAD s/p stent placement Continue aspirin, nitroglycerin, Pravachol, Coreg.  Cardiology consultation appreciated and at this time conservative treatment plan has been initiated with the long-acting nitrates.  Elevated troponin  Troponins x2 -25 > 32>39; recent abnormal stress test.  Continue long-acting nitrates.  Cardiology plans to increase up to 120 if possible.  Acute kidney injury on CKD stage IV BUN/creatinine 80/4.12 (baseline creatinine at 2.6-2.8).  Received IV hydration.  Avoid nephrotoxic medications.  Creatinine today at 3.7 after IV fluid hydration.  Will likely need Lasix 40 mg daily on discharge, we will continue to monitor BMP.  Type 2 diabetes mellitus with hyperglycemia Continue basal and sliding scale insulin while in the hospital   GERD Continue Protonix  Essential hypertension Continue amlodipine, Coreg, hydralazine  Mixed hyperlipidemia Continue atorvastatin  Diabetic neuropathy Continue Neurontin  Gout Continue allopurinol   Chronic diastolic CHF Review of 2D echocardiogram done on 02/07/2022 showed LVEF of 60 to 65%.  Moderate LVH.  LV diastolic parameters indeterminate.  Continue intake and output charting Daily weights     DVT prophylaxis: heparin injection 5,000 Units Start: 05/09/22 2330 SCDs Start: 05/09/22 2321   Code Status:     Code Status: Full Code  Disposition: Home  Status is: Observation  The patient will require care spanning > 2 midnights and should be  moved to inpatient because: Angina, pending clinical improvement, AKI on CKD, hypothermia   Family Communication:  Communicated with the patient's family at  bedside  Consultants:  Cardiology  Procedures:  None  Antimicrobials:  None  Anti-infectives (From admission, onward)    None      Subjective: Today, patient was seen and examined at bedside.  Nursing staff reported that the patient was very hypothermic with feeling of chills and shivering and cold body temperature was 95.1.  Still complains of chest discomfort at the time of my evaluation.  He feels frustrated that he has been having this multiple times.  Denies shortness of breath.  Objective: Vitals:   05/10/22 0930 05/10/22 0952 05/10/22 1000 05/10/22 1230  BP: (!) 126/59 (!) 92/42    Pulse: (!) 57 (!) 57    Resp: 16 (!) 22    Temp:  (!) 95.1 F (35.1 C) (!) 96.1 F (35.6 C) (!) 97.5 F (36.4 C)  TempSrc: Other (Comment) Oral Rectal Oral  SpO2: 97% 99%    Weight:  124.3 kg      Intake/Output Summary (Last 24 hours) at 05/10/2022 1253 Last data filed at 05/10/2022 1230 Gross per 24 hour  Intake 400 ml  Output 300 ml  Net 100 ml   Filed Weights   05/10/22 0952  Weight: 124.3 kg    Physical Examination: Body mass index is 34.71 kg/m.  General: Obese built, not in obvious distress HENT:   No scleral pallor or icterus noted. Oral mucosa is moist.  Chest:  Clear breath sounds.  Diminished breath sounds bilaterally. No crackles or wheezes.  CVS: S1 &S2 heard. No murmur.  Regular rate and rhythm. Abdomen: Soft, nontender, nondistended.  Bowel sounds are heard.   Extremities: No cyanosis, clubbing but has bilateral lower extremity chronic venous insufficiency changes with pigmentation, peripheral pulses are palpable. Psych: Alert, awake and oriented, normal mood CNS:  No cranial nerve deficits.  Power equal in all extremities.   Skin: Warm and dry.  Bilateral lower extremity hyperpigmentation chronic venous changes.  Data Reviewed:   CBC: Recent Labs  Lab 05/09/22 1958 05/10/22 0340  WBC 5.4 6.6  HGB 8.7* 8.5*  HCT 26.4* 26.1*  MCV 98.9 100.4*  PLT 192  937    Basic Metabolic Panel: Recent Labs  Lab 05/09/22 1958 05/10/22 0340  NA 135 140  K 4.0 3.9  CL 101 104  CO2 23 24  GLUCOSE 166* 126*  BUN 80* 79*  CREATININE 4.12* 3.76*  CALCIUM 9.0 9.2  MG  --  2.2  PHOS  --  4.2    Liver Function Tests: Recent Labs  Lab 05/10/22 0340  AST 20  ALT 15  ALKPHOS 56  BILITOT 0.3  PROT 7.1  ALBUMIN 3.8     Radiology Studies: DG Chest 2 View  Result Date: 05/09/2022 CLINICAL DATA:  Chest pain starting today.  Shortness of breath. EXAM: CHEST - 2 VIEW COMPARISON:  02/06/2022 FINDINGS: Stable moderate enlargement of the cardiopericardial silhouette. The patient is rotated to the right on today's radiograph, reducing diagnostic sensitivity and specificity. Linear subsegmental atelectasis or scarring at the right lung base. Minimal blunting of the left posterior costophrenic angle possibly from scarring or trace pleural effusion. Thoracic spondylosis. IMPRESSION: 1. Subsegmental atelectasis at the right lung base. Minimal blunting left posterior costophrenic angle potentially from scarring or trace pleural effusion. 2. Stable moderate enlargement of the cardiopericardial silhouette. Electronically Signed   By: Van Clines M.D.   On:  05/09/2022 20:31      LOS: 0 days    Flora Lipps, MD [facility] Available via Epic secure chat 7am-7pm After these hours, please refer to coverage provider listed on [email or web address] 05/10/2022, 12:53 PM

## 2022-05-11 DIAGNOSIS — I130 Hypertensive heart and chronic kidney disease with heart failure and stage 1 through stage 4 chronic kidney disease, or unspecified chronic kidney disease: Secondary | ICD-10-CM | POA: Diagnosis not present

## 2022-05-11 DIAGNOSIS — E1122 Type 2 diabetes mellitus with diabetic chronic kidney disease: Secondary | ICD-10-CM | POA: Diagnosis not present

## 2022-05-11 DIAGNOSIS — N179 Acute kidney failure, unspecified: Secondary | ICD-10-CM | POA: Diagnosis not present

## 2022-05-11 DIAGNOSIS — R072 Precordial pain: Secondary | ICD-10-CM | POA: Diagnosis not present

## 2022-05-11 DIAGNOSIS — Z7982 Long term (current) use of aspirin: Secondary | ICD-10-CM | POA: Diagnosis not present

## 2022-05-11 DIAGNOSIS — I10 Essential (primary) hypertension: Secondary | ICD-10-CM | POA: Diagnosis not present

## 2022-05-11 DIAGNOSIS — I5032 Chronic diastolic (congestive) heart failure: Secondary | ICD-10-CM | POA: Diagnosis not present

## 2022-05-11 DIAGNOSIS — E782 Mixed hyperlipidemia: Secondary | ICD-10-CM | POA: Diagnosis not present

## 2022-05-11 DIAGNOSIS — M1000 Idiopathic gout, unspecified site: Secondary | ICD-10-CM | POA: Diagnosis not present

## 2022-05-11 DIAGNOSIS — Z79899 Other long term (current) drug therapy: Secondary | ICD-10-CM | POA: Diagnosis not present

## 2022-05-11 DIAGNOSIS — K219 Gastro-esophageal reflux disease without esophagitis: Secondary | ICD-10-CM | POA: Diagnosis not present

## 2022-05-11 DIAGNOSIS — E1142 Type 2 diabetes mellitus with diabetic polyneuropathy: Secondary | ICD-10-CM | POA: Diagnosis not present

## 2022-05-11 DIAGNOSIS — Z955 Presence of coronary angioplasty implant and graft: Secondary | ICD-10-CM | POA: Diagnosis not present

## 2022-05-11 DIAGNOSIS — I2510 Atherosclerotic heart disease of native coronary artery without angina pectoris: Secondary | ICD-10-CM | POA: Diagnosis not present

## 2022-05-11 DIAGNOSIS — N189 Chronic kidney disease, unspecified: Secondary | ICD-10-CM | POA: Diagnosis not present

## 2022-05-11 DIAGNOSIS — Z9861 Coronary angioplasty status: Secondary | ICD-10-CM | POA: Diagnosis not present

## 2022-05-11 DIAGNOSIS — Z96653 Presence of artificial knee joint, bilateral: Secondary | ICD-10-CM | POA: Diagnosis not present

## 2022-05-11 DIAGNOSIS — Z859 Personal history of malignant neoplasm, unspecified: Secondary | ICD-10-CM | POA: Diagnosis not present

## 2022-05-11 DIAGNOSIS — E1165 Type 2 diabetes mellitus with hyperglycemia: Secondary | ICD-10-CM | POA: Diagnosis not present

## 2022-05-11 DIAGNOSIS — R079 Chest pain, unspecified: Secondary | ICD-10-CM | POA: Diagnosis not present

## 2022-05-11 DIAGNOSIS — Z794 Long term (current) use of insulin: Secondary | ICD-10-CM | POA: Diagnosis not present

## 2022-05-11 DIAGNOSIS — Z87891 Personal history of nicotine dependence: Secondary | ICD-10-CM | POA: Diagnosis not present

## 2022-05-11 DIAGNOSIS — I25110 Atherosclerotic heart disease of native coronary artery with unstable angina pectoris: Secondary | ICD-10-CM | POA: Diagnosis not present

## 2022-05-11 LAB — CBC
HCT: 25.7 % — ABNORMAL LOW (ref 39.0–52.0)
Hemoglobin: 8.1 g/dL — ABNORMAL LOW (ref 13.0–17.0)
MCH: 31.5 pg (ref 26.0–34.0)
MCHC: 31.5 g/dL (ref 30.0–36.0)
MCV: 100 fL (ref 80.0–100.0)
Platelets: 206 10*3/uL (ref 150–400)
RBC: 2.57 MIL/uL — ABNORMAL LOW (ref 4.22–5.81)
RDW: 14 % (ref 11.5–15.5)
WBC: 8.2 10*3/uL (ref 4.0–10.5)
nRBC: 0 % (ref 0.0–0.2)

## 2022-05-11 LAB — BASIC METABOLIC PANEL
Anion gap: 9 (ref 5–15)
BUN: 77 mg/dL — ABNORMAL HIGH (ref 8–23)
CO2: 26 mmol/L (ref 22–32)
Calcium: 8.6 mg/dL — ABNORMAL LOW (ref 8.9–10.3)
Chloride: 105 mmol/L (ref 98–111)
Creatinine, Ser: 3.26 mg/dL — ABNORMAL HIGH (ref 0.61–1.24)
GFR, Estimated: 19 mL/min — ABNORMAL LOW (ref >60)
Glucose, Bld: 139 mg/dL — ABNORMAL HIGH (ref 70–99)
Potassium: 3.6 mmol/L (ref 3.5–5.1)
Sodium: 140 mmol/L (ref 135–145)

## 2022-05-11 LAB — GLUCOSE, CAPILLARY
Glucose-Capillary: 150 mg/dL — ABNORMAL HIGH (ref 70–99)
Glucose-Capillary: 160 mg/dL — ABNORMAL HIGH (ref 70–99)

## 2022-05-11 LAB — MAGNESIUM: Magnesium: 2.1 mg/dL (ref 1.7–2.4)

## 2022-05-11 MED ORDER — FUROSEMIDE 40 MG PO TABS
40.0000 mg | ORAL_TABLET | Freq: Every day | ORAL | 2 refills | Status: DC
Start: 2022-05-11 — End: 2022-08-10

## 2022-05-11 MED ORDER — ISOSORBIDE MONONITRATE ER 30 MG PO TB24: 30.0000 mg | ORAL_TABLET | Freq: Every day | ORAL | 2 refills | Status: DC

## 2022-05-11 NOTE — Discharge Summary (Signed)
Physician Discharge Summary  [name]:[id number] DOB: August 25, 1946 DOA: 05/09/2022  PCP: Redmond School, MD  Admit date: 05/09/2022 Discharge date: 05/11/2022  Admitted From: Home  Discharge disposition: Home  Recommendations for Outpatient Follow-Up:   Follow up with your primary care provider in one week.  At this time, patient has been started on Imdur 30 mg daily.  Plan is to titrate Imdur dose up to 120 mg a day as tolerated for angina.  Follow-up with cardiology as scheduled with the clinic. Check CBC, BMP, magnesium in the next visit   Discharge Diagnosis:   Principal Problem:   Chest pain Active Problems:   Essential hypertension   CAD S/P percutaneous coronary angioplasty   Acute kidney injury superimposed on chronic kidney disease (HCC)   Gout   Mixed hyperlipidemia   GERD (gastroesophageal reflux disease)   Chronic diastolic heart failure (HCC)   Diabetic neuropathy (HCC)   Elevated troponin   Type 2 diabetes mellitus with hyperglycemia ([place])   Discharge Condition: Improved.  Diet recommendation: Low sodium, heart healthy.  Carbohydrate-modified.    Wound care: None.  Code status: Full.\  History of Present Illness:   [name] is a 76 y.o. male with medical history significant of essential hypertension, GERD, gout, CAD s/p stent placement, type II DM presented to hospital with complaints of intermittent chest pain.  Patient follows up with  Dr. Harl Bowie and had a nuclear medicine stress test done on Monday (which showed abnormal LV perfusion and small apical infarct with mild peri-infarct ischemia.  LVEF is 55%).  He complained of rapidly worsening chest pain on presentation which improved with nitroglycerin.  In the ED, patient was intermittently bradycardic.  Creatinine was elevated at 4.1 from baseline at 2.6-2.8. Glucose 166, troponin x2 -25 > 32. Chest x-ray showed subsegmental atelectasis at the right lung base. Minimal blunting left  posterior costophrenic angle potentially from scarring or trace pleural effusion. Stable moderate enlargement of the cardiopericardial silhouette.  Patient received nitroglycerin and IV hydration.  Patient was then considered for admission to the hospital for further evaluation and treatment.    Hospital Course:   Following conditions were addressed during hospitalization as listed below,  Chest pain secondary to angina Patient with history of hypertension hyperlipidemia type 2 diabetes CHF CAD and recent stress test showed some apical infarct.  Troponin mildly elevated but flat.  EKG this time with normal sinus rhythm.  Cardiology was consulted and patient is at high risk of left heart cath with underlying kidney disease and conservative treatment plan was discussed with the patient.  At this time patient has been started on Imdur 30 mg daily.  Plan is to titrate Imdur dose up to 120 mg a day as tolerated for angina. Continue aspirin, Coreg, statins on discharge.  Hypothermia.  On presentation.  Required Bair hugger.  TSH within normal limits.  No signs of infection.  Has improved at this time.     History of CAD CAD s/p stent placement Continue aspirin, nitroglycerin, Pravachol, Coreg.  Cardiology recommended addition of long-acting nitrate.  No chest pain on ambulation today.  We will follow-up with cardiology as outpatient.  Elevated troponin  Troponins x2 -25 > 32>39; recent abnormal stress test.  Continue long-acting nitrates on discharge including aspirin Coreg and statins..  Acute kidney injury on CKD stage IV BUN/creatinine 80/4.12 (baseline creatinine at 2.6-2.8).  Received IV hydration.  Avoid nephrotoxic medications.  Creatinine today at 3.2 after IV fluid hydration.  Recommend  continuation of oral Lasix on discharge.  Type 2 diabetes mellitus with hyperglycemia Continue insulin regimen from home.   GERD Continue Protonix  Essential hypertension Continue amlodipine, Coreg,  hydralazine  Mixed hyperlipidemia Continue atorvastatin  Diabetic neuropathy Continue Neurontin  Gout Continue allopurinol   Chronic diastolic CHF Review of 2D echocardiogram done on 02/07/2022 showed LVEF of 60 to 65%.  Moderate LVH.  LV diastolic parameters indeterminate.    Disposition.  At this time, patient is stable for disposition home with outpatient PCP and cardiology follow-up.  Medical Consultants:   Cardiology  Procedures:    None Subjective:   Today, patient was seen and examined.  Denies any chest pain.  Was able to ambulate without discomfort.  Wants to go home.  Discharge Exam:   Vitals:   05/11/22 0900 05/11/22 1000  BP: (!) 151/49 (!) 152/51  Pulse: 78 77  Resp: 17 (!) 24  Temp:    SpO2: 98% 98%   Vitals:   05/11/22 0600 05/11/22 0700 05/11/22 0900 05/11/22 1000  BP: (!) 174/60  (!) 151/49 (!) 152/51  Pulse: 77  78 77  Resp: (!) 23  17 (!) 24  Temp:  98.5 F (36.9 C)    TempSrc:  Axillary    SpO2: 97%  98% 98%  Weight:      Height:       General: Alert awake, not in obvious distress, obese HENT: pupils equally reacting to light,  No scleral pallor or icterus noted. Oral mucosa is moist.  Chest:  Clear breath sounds.  Diminished breath sounds bilaterally. No crackles or wheezes.  CVS: S1 &S2 heard. No murmur.  Regular rate and rhythm. Abdomen: Soft, nontender, nondistended.  Bowel sounds are heard.   Extremities: No cyanosis, clubbing.  Peripheral pulses are palpable.  Bilateral lower extremity chronic venous insufficiency changes with pigmentation, Psych: Alert, awake and oriented, normal mood CNS:  No cranial nerve deficits.  Power equal in all extremities.   Skin: Warm and dry.   The results of significant diagnostics from this hospitalization (including imaging, microbiology, ancillary and laboratory) are listed below for reference.     Diagnostic Studies:   DG Chest 2 View  Result Date: 05/09/2022 CLINICAL DATA:  Chest pain  starting today.  Shortness of breath. EXAM: CHEST - 2 VIEW COMPARISON:  02/06/2022 FINDINGS: Stable moderate enlargement of the cardiopericardial silhouette. The patient is rotated to the right on today's radiograph, reducing diagnostic sensitivity and specificity. Linear subsegmental atelectasis or scarring at the right lung base. Minimal blunting of the left posterior costophrenic angle possibly from scarring or trace pleural effusion. Thoracic spondylosis. IMPRESSION: 1. Subsegmental atelectasis at the right lung base. Minimal blunting left posterior costophrenic angle potentially from scarring or trace pleural effusion. 2. Stable moderate enlargement of the cardiopericardial silhouette. Electronically Signed   By: Van Clines M.D.   On: 05/09/2022 20:31     Labs:   Basic Metabolic Panel: Recent Labs  Lab 05/09/22 1958 05/10/22 0340 05/11/22 0126  NA 135 140 140  K 4.0 3.9 3.6  CL 101 104 105  CO2 23 24 26   GLUCOSE 166* 126* 139*  BUN 80* 79* 77*  CREATININE 4.12* 3.76* 3.26*  CALCIUM 9.0 9.2 8.6*  MG  --  2.2 2.1  PHOS  --  4.2  --    GFR Estimated Creatinine Clearance: 26.4 mL/min (A) (by C-G formula based on SCr of 3.26 mg/dL (H)). Liver Function Tests: Recent Labs  Lab 05/10/22 0340  AST 20  ALT 15  ALKPHOS 56  BILITOT 0.3  PROT 7.1  ALBUMIN 3.8   No results for input(s): LIPASE, AMYLASE in the last 168 hours. No results for input(s): AMMONIA in the last 168 hours. Coagulation profile No results for input(s): INR, PROTIME in the last 168 hours.  CBC: Recent Labs  Lab 05/09/22 1958 05/10/22 0340 05/11/22 0126  WBC 5.4 6.6 8.2  HGB 8.7* 8.5* 8.1*  HCT 26.4* 26.1* 25.7*  MCV 98.9 100.4* 100.0  PLT 192 186 206   Cardiac Enzymes: No results for input(s): CKTOTAL, CKMB, CKMBINDEX, TROPONINI in the last 168 hours. BNP: Invalid input(s): POCBNP CBG: Recent Labs  Lab 05/10/22 0815 05/10/22 1227 05/10/22 1515 05/10/22 2136 05/11/22 0732  GLUCAP  134* 143* 158* 163* 150*   D-Dimer No results for input(s): DDIMER in the last 72 hours. Hgb A1c No results for input(s): HGBA1C in the last 72 hours. Lipid Profile Recent Labs    05/10/22 0340  CHOL 99  HDL 29*  LDLCALC 46  TRIG 121  CHOLHDL 3.4   Thyroid function studies Recent Labs    05/10/22 0348  TSH 3.603   Anemia work up No results for input(s): VITAMINB12, FOLATE, FERRITIN, TIBC, IRON, RETICCTPCT in the last 72 hours. Microbiology Recent Results (from the past 240 hour(s))  MRSA Next Gen by PCR, Nasal     Status: None   Collection Time: 05/10/22 12:23 PM   Specimen: Nasal Mucosa; Nasal Swab  Result Value Ref Range Status   MRSA by PCR Next Gen NOT DETECTED NOT DETECTED Final    Comment: (NOTE) The GeneXpert MRSA Assay (FDA approved for NASAL specimens only), is one component of a comprehensive MRSA colonization surveillance program. It is not intended to diagnose MRSA infection nor to guide or monitor treatment for MRSA infections. Test performance is not FDA approved in patients less than 74 years old. Performed at [facility], [address], [place],  [address]      Discharge Instructions:   Discharge Instructions     Diet - low sodium heart healthy   Complete by: As directed    Diet Carb Modified   Complete by: As directed    Discharge instructions   Complete by: As directed    Follow-up with your primary care physician in 1 week.  Follow-up with cardiology as has been scheduled by the clinic.  Do not overexert.  Seek medical attention for worsening symptoms. Take nitroglycerin as has been prescribed.   Increase activity slowly   Complete by: As directed       Allergies as of 05/11/2022       Reactions   Metformin    Niacin    Niacin And Related Other (See Comments)   Reaction: flushing and burning side effects even with extended release        Medication List     TAKE these medications    acarbose 50 MG tablet Commonly  known as: PRECOSE Take 50 mg by mouth in the morning, at noon, and at bedtime.   acetaminophen 650 MG CR tablet Commonly known as: TYLENOL Take 1,300 mg by mouth every 8 (eight) hours as needed for pain.   allopurinol 100 MG tablet Commonly known as: ZYLOPRIM Take 100-150 mg by mouth 2 (two) times daily. Takes 1.5 tablet in am and 1 tablet in evening   amLODipine 10 MG tablet Commonly known as: NORVASC Take 10 mg by mouth every evening.   aspirin EC 81 MG tablet Take 1  tablet (81 mg total) by mouth daily.   calcitRIOL 0.25 MCG capsule Commonly known as: ROCALTROL Take 0.25 mcg by mouth every Sunday.   carvedilol 6.25 MG tablet Commonly known as: COREG Take 6.25 mg by mouth 2 (two) times daily with a meal.   cholecalciferol 1000 units tablet Commonly known as: VITAMIN D Take 1,000 Units daily by mouth.   cloNIDine 0.1 MG tablet Commonly known as: CATAPRES Take 0.1 mg by mouth 3 (three) times daily.   dicyclomine 10 MG capsule Commonly known as: BENTYL Take 10 mg by mouth 3 (three) times daily.   furosemide 40 MG tablet Commonly known as: LASIX Take 1 tablet (40 mg total) by mouth daily. What changed:  how much to take when to take this reasons to take this   gabapentin 300 MG capsule Commonly known as: NEURONTIN Take 300 mg by mouth 3 (three) times daily.   hydrALAZINE 50 MG tablet Commonly known as: APRESOLINE Take 100 mg by mouth 2 (two) times daily.   insulin NPH Human 100 UNIT/ML injection Commonly known as: NovoLIN N Inject 0.18 mLs (18 Units total) into the skin every morning. Ands syringes 1/day   isosorbide mononitrate 30 MG 24 hr tablet Commonly known as: IMDUR Take 1 tablet (30 mg total) by mouth daily. Start taking on: May 12, 2022   lubiprostone 8 MCG capsule Commonly known as: AMITIZA Take by mouth.   meclizine 25 MG tablet Commonly known as: ANTIVERT Take 25 mg by mouth 3 (three) times daily as needed for dizziness.   nitroGLYCERIN  0.4 MG SL tablet Commonly known as: NITROSTAT Place 1 tablet (0.4 mg total) under the tongue every 5 (five) minutes as needed for chest pain. PLACE ONE TABLET UNDER THE TONGUE EVERY 5 MINUTES FOR 3 DOSES AS NEEDED Strength: 0.4 mg   ondansetron 4 MG tablet Commonly known as: ZOFRAN TAKE ONE TABLET (4MG TOTAL) BY MOUTH EVERY 8 HOURS AS NEEDED FOR NAUSEA OR VOMITING   pantoprazole 40 MG tablet Commonly known as: PROTONIX TAKE ONE TABLET (40MG TOTAL) BY MOUTH TWO TIMES DAILY BEFORE A MEAL What changed: See the new instructions.   pravastatin 40 MG tablet Commonly known as: PRAVACHOL Take 1 tablet (40 mg total) by mouth every evening.   True Metrix Blood Glucose Test test strip Generic drug: glucose blood USE TO TEST BLOOD SUGAR UP TO TWICE DAILY OR AS DIRECTED        Follow-up Information     Redmond School, MD Follow up in 1 week(s).   Specialty: Internal Medicine Contact information: [address] [place] [address] [phone]         Arnoldo Lenis, MD .   Specialty: Cardiology Contact information: [address] [place] [address] [phone]                  Time coordinating discharge: 39 minutes  Signed:  Javarus Dorner  [facility] 05/11/2022, 10:50 AM

## 2022-05-11 NOTE — Progress Notes (Signed)
Ambulated patient to the end of the hallway and back. He was stable on his feet. Denies SOB or chest. No resting in bedside chair.

## 2022-05-13 ENCOUNTER — Ambulatory Visit: Payer: Medicare Other | Admitting: Physician Assistant

## 2022-05-13 ENCOUNTER — Encounter: Payer: Self-pay | Admitting: Physician Assistant

## 2022-05-13 VITALS — BP 140/60 | HR 56 | Ht 74.5 in | Wt 267.0 lb

## 2022-05-13 DIAGNOSIS — D638 Anemia in other chronic diseases classified elsewhere: Secondary | ICD-10-CM

## 2022-05-13 DIAGNOSIS — Z9861 Coronary angioplasty status: Secondary | ICD-10-CM

## 2022-05-13 DIAGNOSIS — N184 Chronic kidney disease, stage 4 (severe): Secondary | ICD-10-CM

## 2022-05-13 DIAGNOSIS — I2510 Atherosclerotic heart disease of native coronary artery without angina pectoris: Secondary | ICD-10-CM

## 2022-05-13 DIAGNOSIS — I5030 Unspecified diastolic (congestive) heart failure: Secondary | ICD-10-CM | POA: Diagnosis not present

## 2022-05-13 DIAGNOSIS — I10 Essential (primary) hypertension: Secondary | ICD-10-CM

## 2022-05-13 DIAGNOSIS — E785 Hyperlipidemia, unspecified: Secondary | ICD-10-CM | POA: Diagnosis not present

## 2022-05-13 NOTE — Progress Notes (Signed)
Cardiology Office Note    Date:  05/13/2022   ID:  [name], [name] 1946-12-10, MRN [id number]   PCP:  [facility], [place]  Cardiologist:  Carlyle Dolly, MD   Advanced Practice Provider:  No care team member to display Electrophysiologist:  None   [phone]   No chief complaint on file.   History of Present Illness:  [name] is a 76 y.o. male  with a hx of CAD (s/p DES to LAD and D1 in 10/2012 with nonobstructive disease along RCA and LCx), HFpEF, HTN, HLD and Stage 4 CKD    [name] was examined by Dr. Harl Bowie in 01/2022 for hospital follow-up from a recent admission for chest pain and reported occasional episodes of exertional chest pain but no persistent symptoms. His ARB had previously been discontinued by Nephrology given his worsening renal function and he was started on Hydralazine. He was continued on his current cardiac medications at that time including Amlodipine 10 mg daily, ASA 81 mg daily, Clonidine 0.1 mg 3 times daily, Lasix 40 mg daily, Hydralazine 50 mg 3 times daily and Pravastatin 40 mg daily.   He walked into the office in 03/2022 reporting chest pain and refused to go to the ED for evaluation. Dr. Harl Bowie recommended a Leane Call for further evaluation and this was performed on 05/06/2022 and showed findings consistent with prior inferior infarct and a small apical infarct with mild peri-infarct ischemia but was overall felt to be a low risk study.  EF was preserved at 55%.   He presented to the ED on 05/09/2022 for evaluation of chest pain and dyspnea on exertion consistent with angina. Because of CKD and no intention of going on dialysis, LHC too high risk. Imdur increased to 60 mg daily. Medical management recommended.   Patient comes in for hospital f/u. Hasn't picked up his Imdur yet so took a NTG SL this am instead even  thought he wasn't having chest pain. Wants to start exercising on his bike again.  Having 3 teeth pulled Friday. Says he planned on having canned soup through the the weekend but we discussed he shouldn't have this.     Past Medical History:  Diagnosis Date   Anxiety    Benign prostatic hypertrophy    Nocturia   CAD (coronary artery disease)    a.  NSTEMI 10/2012 s/p DES to LAD & DES to 1st diagonal with residual diffuse nonobstructive dz in LCx/RCA    Chronic kidney disease, stage 3, mod decreased GFR (HCC)    Creatinine of 1.5 in 03/2009, STAGE 4 now   Degenerative joint disease    Depression    hx of   Diabetes mellitus, type II ([place]) 12/10/2012.   Diastolic heart failure    Pedal edema, "a long time ago"   Erectile dysfunction    Flu 2013   hx of   GERD (gastroesophageal reflux disease)    Gout    Hemorrhoids    High cholesterol    Hypertension    Exercise induced   Myocardial infarction [facility])    NSVT (nonsustained ventricular tachycardia) ([place])    Exercise induced   Obesity    Tobacco abuse, 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, quit 1994   Tubular adenoma 2014    Past Surgical History:  Procedure Laterality Date   BACK SURGERY  2007   x2   BIOPSY  02/05/2021   Procedure: BIOPSY;  Surgeon: Daneil Dolin, MD;  Location: [facility];  Service: Endoscopy;;   CARDIAC CATHETERIZATION     with stent placement   COLONOSCOPY WITH ESOPHAGOGASTRODUODENOSCOPY (EGD) N/A 11/23/2013   Dr. Oneida Alar- TCS= left sided colitis and mild proctitis likely due to ischemic colitis, moderate internal hemorrhoids, small nodule in the rectum, bx=tubular adenoma, EGD=-mild non-erosive gastritis   COLONOSCOPY WITH PROPOFOL N/A 07/24/2020   hemorrhoids,  rectosigmoid lipoma, small adenoma removed.   ESOPHAGOGASTRODUODENOSCOPY (EGD) WITH PROPOFOL N/A 02/05/2021   Normal esophagus, erythematous mucosa in entire stomach, no obvious ulcer, s/p biopsy. Normal duodenum. Negative H.pylori.    HERNIA REPAIR     LEFT HEART CATHETERIZATION WITH CORONARY ANGIOGRAM N/A 11/16/2012   Procedure:  LEFT HEART CATHETERIZATION WITH CORONARY ANGIOGRAM;  Surgeon: Sherren Mocha, MD;  Location: [facility] CATH LAB;  Service: Cardiovascular;  Laterality: N/A;   PERCUTANEOUS CORONARY STENT INTERVENTION (PCI-S)  11/16/2012   Procedure: PERCUTANEOUS CORONARY STENT INTERVENTION (PCI-S);  Surgeon: Sherren Mocha, MD;  Location: [facility] CATH LAB;  Service: Cardiovascular;;   POLYPECTOMY  07/24/2020   Procedure: POLYPECTOMY;  Surgeon: Daneil Dolin, MD;  Location: [facility];  Service: Endoscopy;;   TOTAL KNEE ARTHROPLASTY Right 11/15/2013   Procedure: RIGHT TOTAL KNEE ARTHROPLASTY;  Surgeon: Mauri Pole, MD;  Location: [facility] ORS;  Service: Orthopedics;  Laterality: Right;   TOTAL KNEE ARTHROPLASTY Left 03/07/2015   Procedure: LEFT TOTAL KNEE ARTHROPLASTY;  Surgeon: Paralee Cancel, MD;  Location: [facility] ORS;  Service: Orthopedics;  Laterality: Left;    Current Medications: Current Meds  Medication Sig   acarbose (PRECOSE) 50 MG tablet Take 50 mg by mouth in the morning, at noon, and at bedtime.    acetaminophen (TYLENOL) 650 MG CR tablet Take 1,300 mg by mouth every 8 (eight) hours as needed for pain.   allopurinol (ZYLOPRIM) 100 MG tablet Take 100-150 mg by mouth 2 (two) times daily. Takes 1.5 tablet in am and 1 tablet in evening   amLODipine (NORVASC) 10 MG tablet Take 10 mg by mouth every evening.    aspirin EC 81 MG tablet Take 1 tablet (81 mg total) by mouth daily.   calcitRIOL (ROCALTROL) 0.25 MCG capsule Take 0.25 mcg by mouth every Sunday.    carvedilol (COREG) 6.25 MG tablet Take 6.25 mg by mouth 2 (two) times daily with a meal.   cholecalciferol (VITAMIN D) 1000 units tablet Take 1,000 Units daily by mouth.   cloNIDine (CATAPRES) 0.1 MG tablet Take 0.1 mg by mouth 3 (three) times daily.   dicyclomine (BENTYL) 10 MG capsule Take 10 mg by mouth 3 (three) times daily.   furosemide (LASIX) 40 MG tablet Take 1 tablet (40 mg total) by mouth daily.   gabapentin (NEURONTIN) 300 MG capsule Take 300 mg by mouth 3  (three) times daily.    hydrALAZINE (APRESOLINE) 50 MG tablet Take 100 mg by mouth 2 (two) times daily.   insulin NPH Human (NOVOLIN N) 100 UNIT/ML injection Inject 0.18 mLs (18 Units total) into the skin every morning. Ands syringes 1/day   lubiprostone (AMITIZA) 8 MCG capsule Take by mouth.   meclizine (ANTIVERT) 25 MG tablet Take 25 mg by mouth 3 (three) times daily as needed for dizziness.   nitroGLYCERIN (NITROSTAT) 0.4 MG SL tablet Place 1 tablet (0.4 mg total) under the tongue every 5 (five) minutes as needed for chest pain. PLACE ONE TABLET UNDER THE TONGUE EVERY 5 MINUTES FOR 3 DOSES AS NEEDED Strength: 0.4 mg   ondansetron (ZOFRAN) 4 MG  tablet TAKE ONE TABLET (4MG TOTAL) BY MOUTH EVERY 8 HOURS AS NEEDED FOR NAUSEA OR VOMITING   pantoprazole (PROTONIX) 40 MG tablet TAKE ONE TABLET (40MG TOTAL) BY MOUTH TWO TIMES DAILY BEFORE A MEAL (Patient taking differently: Take 40 mg by mouth 2 (two) times daily.)   pravastatin (PRAVACHOL) 40 MG tablet Take 1 tablet (40 mg total) by mouth every evening.   TRUE METRIX BLOOD GLUCOSE TEST test strip USE TO TEST BLOOD SUGAR UP TO TWICE DAILY OR AS DIRECTED     Allergies:   Metformin, Niacin, and Niacin and related   Social History   Socioeconomic History   Marital status: Widowed    Spouse name: Not on file   Number of children: Not on file   Years of education: Not on file   Highest education level: Not on file  Occupational History   Occupation: Disabled    Employer: RETIRED  Tobacco Use   Smoking status: Former    Packs/day: 1.00    Years: 30.00    Pack years: 30.00    Types: Cigarettes    Start date: 01/22/1965    Quit date: 12/23/1992    Years since quitting: 29.4   Smokeless tobacco: Never  Vaping Use   Vaping Use: Never used  Substance and Sexual Activity   Alcohol use: No    Alcohol/week: 0.0 standard drinks   Drug use: No   Sexual activity: Not Currently  Other Topics Concern   Not on file  Social History Narrative    Married   Right handed   One story home no caffeine   Social Determinants of Health   Financial Resource Strain: Not on file  Food Insecurity: Not on file  Transportation Needs: Not on file  Physical Activity: Not on file  Stress: Not on file  Social Connections: Not on file     Family History:  The patient's  family history includes CAD in his brother; Cancer in his father and another family member; Diabetes in an other family member; Heart disease in an other family member; Hypertension in his mother and another family member.   ROS:   Please see the history of present illness.    ROS All other systems reviewed and are negative.   PHYSICAL EXAM:   VS:  BP 140/60   Pulse (!) 56   Ht 6' 2.5" (1.892 m)   Wt 267 lb (121.1 kg)   SpO2 98%   BMI 33.82 kg/m   Physical Exam  GEN: Obese, in no acute distress  Neck: no JVD, carotid bruits, or masses Cardiac:RRR; no murmurs, rubs, or gallops  Respiratory:  clear to auscultation bilaterally, normal work of breathing GI: soft, nontender, nondistended, + BS Ext: chronic edema without cyanosis, clubbing,Good distal pulses bilaterally Neuro:  Alert and Oriented x 3, Psych: euthymic mood, full affect  Wt Readings from Last 3 Encounters:  05/13/22 267 lb (121.1 kg)  05/11/22 263 lb 0.1 oz (119.3 kg)  04/18/22 276 lb (125.2 kg)      Studies/Labs Reviewed:   EKG:  EKG is not ordered today.     Recent Labs: 05/10/2022: ALT 15; TSH 3.603 05/11/2022: BUN 77; Creatinine, Ser 3.26; Hemoglobin 8.1; Magnesium 2.1; Platelets 206; Potassium 3.6; Sodium 140   Lipid Panel    Component Value Date/Time   CHOL 99 05/10/2022 0340   TRIG 121 05/10/2022 0340   HDL 29 (L) 05/10/2022 0340   CHOLHDL 3.4 05/10/2022 0340   VLDL 24 05/10/2022 0340  [place] 46 05/10/2022 0340    Additional studies/ records that were reviewed today include:   Echocardiogram: 01/2022 IMPRESSIONS     1. Left ventricular ejection fraction, by estimation, is 60  to 65%. The  left ventricle has normal function. The left ventricle has no regional  wall motion abnormalities. There is moderate left ventricular hypertrophy.  Left ventricular diastolic  parameters are indeterminate.   2. Right ventricular systolic function is normal. The right ventricular  size is normal.   3. Left atrial size was moderately dilated.   4. The mitral valve is normal in structure. No evidence of mitral valve  regurgitation. No evidence of mitral stenosis.   5. The aortic valve was not well visualized. There is moderate  calcification of the aortic valve. There is moderate thickening of the  aortic valve. Aortic valve regurgitation is not visualized. Aortic valve  sclerosis is present, with no evidence of  aortic valve stenosis.   6. The inferior vena cava is normal in size with greater than 50%  respiratory variability, suggesting right atrial pressure of 3 mmHg.   NST: 04/2022   Findings are consistent with prior inferior myocardial infarction. There is a small apical infarct with mild peri-infarct ischemia.  The study is low risk.   No ST deviation was noted.   LV perfusion is abnormal.   Left ventricular function is normal. Nuclear stress EF: 55 %. The left ventricular ejection fraction is normal (55-65%). End diastolic cavity size is severely enlarged.     Risk Assessment/Calculations:         ASSESSMENT:    1. CAD S/P percutaneous coronary angioplasty   2. Heart failure with preserved ejection fraction, unspecified HF chronicity ([place])   3. Essential hypertension   4. Hyperlipidemia, unspecified hyperlipidemia type   5. CKD (chronic kidney disease) stage 4, GFR 15-29 ml/min (HCC)   6. Anemia of chronic disease      PLAN:  In order of problems listed above:  CAD s/p DES to LAD and D1 in 10/2012 with nonobstructive disease along RCA and LCx. Continue ASA, BB and statin therapy. NST 05/06/22 low risk, mild peri infarct ischemia. Recurrent chest pain and  admission-started on  Imdur as discussed above. Avoid cath with CKD and no intention of going on dialysis. Patient hasn't started Imdur yet-picking up today. No further chest pain. Can titrate Imdur up as needed.    HFpEF - Echocardiogram in 01/2022 showed a preserved EF of 60 to 65% with no regional wall motion abnormalities. Diuretic therapy has been deferred to Nephrology in the outpatient setting given his advanced CKD. 2 gm sodium diet discussed    HTN BP has overall been well-controlled,  Remains on Coreg, Amlodipine and Hydralazine.     HLD- .  on Pravastatin 40 mg daily.LDL 46 05/10/22   Stage 4 CKD  Baseline creatinine 2.5 - 2.8.Elevated to 4.12 on admission and improved to 3.26 05/11/22. Received IV fluids    Anemia- Hgb at 8.5 in hospital and likely due to chronic disease but this has significantly declined over the past few months as previously at 11 - 12 in 2022. No reports of active bleeding. Management per PCP     Shared Decision Making/Informed Consent        Medication Adjustments/Labs and Tests Ordered: Current medicines are reviewed at length with the patient today.  Concerns regarding medicines are outlined above.  Medication changes, Labs and Tests ordered today are listed in the Patient Instructions below. Patient Instructions  Medication Instructions:  Your physician recommends that you continue on your current medications as directed. Please refer to the Current Medication list given to you today.  Pick up you Imdur today   *If you need a refill on your cardiac medications before your next appointment, please call your pharmacy*   Lab Work: NONE   If you have labs (blood work) drawn today and your tests are completely normal, you will receive your results only by: [facility] (if you have MyChart) OR A paper copy in the mail If you have any lab test that is abnormal or we need to change your treatment, we will call you to review the  results.   Testing/Procedures: NONE    Follow-Up: At [facility], you and your health needs are our priority.  As part of our continuing mission to provide you with exceptional heart care, we have created designated Provider Care Teams.  These Care Teams include your primary Cardiologist (physician) and Advanced Practice Providers (APPs -  Physician Assistants and Nurse Practitioners) who all work together to provide you with the care you need, when you need it.  We recommend signing up for the patient portal called "MyChart".  Sign up information is provided on this After Visit Summary.  MyChart is used to connect with patients for Virtual Visits (Telemedicine).  Patients are able to view lab/test results, encounter notes, upcoming appointments, etc.  Non-urgent messages can be sent to your provider as well.   To learn more about what you can do with MyChart, go to [email or web address].    Your next appointment:    As Scheduled   The format for your next appointment:   In Person  Provider:   Carlyle Dolly, MD    Other Instructions Thank you for choosing [facility]!     Important Information About Sugar      Two Gram Sodium Diet 2000 mg  What is Sodium? Sodium is a mineral found naturally in many foods. The most significant source of sodium in the diet is table salt, which is about 40% sodium.  Processed, convenience, and preserved foods also contain a large amount of sodium.  The body needs only 500 mg of sodium daily to function,  A normal diet provides more than enough sodium even if you do not use salt.  Why Limit Sodium? A build up of sodium in the body can cause thirst, increased blood pressure, shortness of breath, and water retention.  Decreasing sodium in the diet can reduce edema and risk of heart attack or stroke associated with high blood pressure.  Keep in mind that there are many other factors involved in these health problems.  Heredity, obesity,  lack of exercise, cigarette smoking, stress and what you eat all play a role.  General Guidelines: Do not add salt at the table or in cooking.  One teaspoon of salt contains over 2 grams of sodium. Read food labels Avoid processed and convenience foods Ask your dietitian before eating any foods not dicussed in the menu planning guidelines Consult your physician if you wish to use a salt substitute or a sodium containing medication such as antacids.  Limit milk and milk products to 16 oz (2 cups) per day.  Shopping Hints: READ LABELS!! "Dietetic" does not necessarily mean low sodium. Salt and other sodium ingredients are often added to foods during processing.    Menu Planning Guidelines Food Group Choose More Often Avoid  Beverages (see also the milk group All fruit  juices, low-sodium, salt-free vegetables juices, low-sodium carbonated beverages Regular vegetable or tomato juices, commercially softened water used for drinking or cooking  Breads and Cereals Enriched white, wheat, rye and pumpernickel bread, hard rolls and dinner rolls; muffins, cornbread and waffles; most dry cereals, cooked cereal without added salt; unsalted crackers and breadsticks; low sodium or homemade bread crumbs Bread, rolls and crackers with salted tops; quick breads; instant hot cereals; pancakes; commercial bread stuffing; self-rising flower and biscuit mixes; regular bread crumbs or cracker crumbs  Desserts and Sweets Desserts and sweets mad with mild should be within allowance Instant pudding mixes and cake mixes  Fats Butter or margarine; vegetable oils; unsalted salad dressings, regular salad dressings limited to 1 Tbs; light, sour and heavy cream Regular salad dressings containing bacon fat, bacon bits, and salt pork; snack dips made with instant soup mixes or processed cheese; salted nuts  Fruits Most fresh, frozen and canned fruits Fruits processed with salt or sodium-containing ingredient (some dried fruits  are processed with sodium sulfites        Vegetables Fresh, frozen vegetables and low- sodium canned vegetables Regular canned vegetables, sauerkraut, pickled vegetables, and others prepared in brine; frozen vegetables in sauces; vegetables seasoned with ham, bacon or salt pork  Condiments, Sauces, Miscellaneous  Salt substitute with physician's approval; pepper, herbs, spices; vinegar, lemon or lime juice; hot pepper sauce; garlic powder, onion powder, low sodium soy sauce (1 Tbs.); low sodium condiments (ketchup, chili sauce, mustard) in limited amounts (1 tsp.) fresh ground horseradish; unsalted tortilla chips, pretzels, potato chips, popcorn, salsa (1/4 cup) Any seasoning made with salt including garlic salt, celery salt, onion salt, and seasoned salt; sea salt, rock salt, kosher salt; meat tenderizers; monosodium glutamate; mustard, regular soy sauce, barbecue, sauce, chili sauce, teriyaki sauce, steak sauce, Worcestershire sauce, and most flavored vinegars; canned gravy and mixes; regular condiments; salted snack foods, olives, picles, relish, horseradish sauce, catsup   Food preparation: Try these seasonings Meats:    Pork Sage, onion Serve with applesauce  Chicken Poultry seasoning, thyme, parsley Serve with cranberry sauce  Lamb Curry powder, rosemary, garlic, thyme Serve with mint sauce or jelly  Veal Marjoram, basil Serve with current jelly, cranberry sauce  Beef Pepper, bay leaf Serve with dry mustard, unsalted chive butter  Fish [place] leaf, dill Serve with unsalted lemon butter, unsalted parsley butter  Vegetables:    Asparagus Lemon juice   Broccoli Lemon juice   Carrots Mustard dressing parsley, mint, nutmeg, glazed with unsalted butter and sugar   Green beans Marjoram, lemon juice, nutmeg,dill seed   Tomatoes Basil, marjoram, onion   Spice /blend for [employer]" 4 tsp ground thyme 1 tsp ground sage 3 tsp ground rosemary 4 tsp ground marjoram   Test your knowledge A  product that says "Salt Free" may still contain sodium. True or False Garlic Powder and Hot Pepper Sauce an be used as alternative seasonings.True or False Processed foods have more sodium than fresh foods.  True or False Canned Vegetables have less sodium than froze True or False   WAYS TO DECREASE YOUR SODIUM INTAKE Avoid the use of added salt in cooking and at the table.  Table salt (and other prepared seasonings which contain salt) is probably one of the greatest sources of sodium in the diet.  Unsalted foods can gain flavor from the sweet, sour, and butter taste sensations of herbs and spices.  Instead of using salt for seasoning, try the following seasonings with the foods listed.  Remember:  how you use them to enhance natural food flavors is limited only by your creativity... Allspice-Meat, fish, eggs, fruit, peas, red and yellow vegetables Almond Extract-Fruit baked goods Anise Seed-Sweet breads, fruit, carrots, beets, cottage cheese, cookies (tastes like licorice) Basil-Meat, fish, eggs, vegetables, rice, vegetables salads, soups, sauces Bay Leaf-Meat, fish, stews, poultry Burnet-Salad, vegetables (cucumber-like flavor) Caraway Seed-Bread, cookies, cottage cheese, meat, vegetables, cheese, rice Cardamon-Baked goods, fruit, soups Celery Powder or seed-Salads, salad dressings, sauces, meatloaf, soup, bread.Do not use  celery salt Chervil-Meats, salads, fish, eggs, vegetables, cottage cheese (parsley-like flavor) Chili Power-Meatloaf, chicken cheese, corn, eggplant, egg dishes Chives-Salads cottage cheese, egg dishes, soups, vegetables, sauces Cilantro-Salsa, casseroles Cinnamon-Baked goods, fruit, pork, lamb, chicken, carrots Cloves-Fruit, baked goods, fish, pot roast, green beans, beets, carrots Coriander-Pastry, cookies, meat, salads, cheese (lemon-orange flavor) Cumin-Meatloaf, fish,cheese, eggs, cabbage,fruit pie (caraway flavor) [employer], fruit, eggs, fish, poultry,  cottage cheese, vegetables Dill Seed-Meat, cottage cheese, poultry, vegetables, fish, salads, bread Fennel Seed-Bread, cookies, apples, pork, eggs, fish, beets, cabbage, cheese, Licorice-like flavor Garlic-(buds or powder) Salads, meat, poultry, fish, bread, butter, vegetables, potatoes.Do not  use garlic salt Ginger-Fruit, vegetables, baked goods, meat, fish, poultry Horseradish Root-Meet, vegetables, butter Lemon Juice or Extract-Vegetables, fruit, tea, baked goods, fish salads Mace-Baked goods fruit, vegetables, fish, poultry (taste like nutmeg) Maple Extract-Syrups Marjoram-Meat, chicken, fish, vegetables, breads, green salads (taste like Sage) Mint-Tea, lamb, sherbet, vegetables, desserts, carrots, cabbage Mustard, Dry or Seed-Cheese, eggs, meats, vegetables, poultry Nutmeg-Baked goods, fruit, chicken, eggs, vegetables, desserts Onion Powder-Meat, fish, poultry, vegetables, cheese, eggs, bread, rice salads (Do not use   Onion salt) Orange Extract-Desserts, baked goods Oregano-Pasta, eggs, cheese, onions, pork, lamb, fish, chicken, vegetables, green salads Paprika-Meat, fish, poultry, eggs, cheese, vegetables Parsley Flakes-Butter, vegetables, meat fish, poultry, eggs, bread, salads (certain forms may   Contain sodium Pepper-Meat fish, poultry, vegetables, eggs Peppermint Extract-Desserts, baked goods Poppy Seed-Eggs, bread, cheese, fruit dressings, baked goods, noodles, vegetables, cottage  [employer], poultry, meat, fish, cauliflower, turnips,eggs bread Saffron-Rice, bread, veal, chicken, fish, eggs Sage-Meat, fish, poultry, onions, eggplant, tomateos, pork, stews Savory-Eggs, salads, poultry, meat, rice, vegetables, soups, pork Tarragon-Meat, poultry, fish, eggs, butter, vegetables (licorice-like flavor)  Thyme-Meat, poultry, fish, eggs, vegetables, (clover-like flavor), sauces, soups Tumeric-Salads, butter, eggs, fish, rice, vegetables  (saffron-like flavor) Vanilla Extract-Baked goods, candy Vinegar-Salads, vegetables, meat marinades Walnut Extract-baked goods, candy   2. Choose your Foods Wisely   The following is a list of foods to avoid which are high in sodium:  Meats-Avoid all smoked, canned, salt cured, dried and kosher meat and fish as well as Anchovies   [employer] meats:Bologna, Liverwurst, Pastrami Canned meat or fish  Marinated herring Caviar    Pepperoni Corned Beef   Pizza Dried chipped beef  Salami Frozen breaded fish or meat Salt pork Frankfurters or hot dogs  Sardines Gefilte fish   Sausage Ham (boiled ham, Proscuitto Smoked butt    spiced ham)   Spam      TV Dinners Vegetables Canned vegetables (Regular) Relish Canned mushrooms  Sauerkraut Olives    Tomato juice Pickles  Bakery and Dessert Products Canned puddings  Cream pies Cheesecake   Decorated cakes Cookies  Beverages/Juices Tomato juice, regular  Gatorade   V-8 vegetable juice, regular  Breads and Cereals Biscuit mixes   Salted potato chips, corn chips, pretzels Bread stuffing mixes  Salted crackers and rolls Pancake and waffle mixes Self-rising flour  Seasonings Accent    Meat sauces Barbecue sauce  Meat tenderizer Catsup  Monosodium glutamate (MSG) Celery salt   Onion salt Chili sauce   Prepared mustard Garlic salt   Salt, seasoned salt, sea salt Gravy mixes   Soy sauce Horseradish   Steak sauce Ketchup   Tartar sauce Lite salt    Teriyaki sauce Marinade mixes   Worcestershire sauce  Others Baking powder   Cocoa and cocoa mixes Baking soda   Commercial casserole mixes Candy-caramels, chocolate  Dehydrated soups    Bars, fudge,nougats  Instant rice and pasta mixes Canned broth or soup  Maraschino cherries Cheese, aged and processed cheese and cheese spreads  Learning Assessment Quiz  Indicated T (for True) or F (for False) for each of the following statements:  _____ Fresh fruits and vegetables  and unprocessed grains are generally low in sodium _____ Water may contain a considerable amount of sodium, depending on the source _____ You can always tell if a food is high in sodium by tasting it _____ Certain laxatives my be high in sodium and should be avoided unless prescribed   by a physician or pharmacist _____ Salt substitutes may be used freely by anyone on a sodium restricted diet _____ Sodium is present in table salt, food additives and as a natural component of   most foods _____ Table salt is approximately 90% sodium _____ Limiting sodium intake may help prevent excess fluid accumulation in the body _____ On a sodium-restricted diet, seasonings such as bouillon soy sauce, and    cooking wine should be used in place of table salt _____ On an ingredient list, a product which lists monosodium glutamate as the first   ingredient is an appropriate food to include on a low sodium diet  Circle the best answer(s) to the following statements (Hint: there may be more than one correct answer)  11. On a low-sodium diet, some acceptable snack items are:    A. Olives  F. Bean dip   K. Grapefruit juice    B. Salted Pretzels G. Commercial Popcorn   L. Canned peaches    C. Carrot Sticks  H. Bouillon   M. Unsalted nuts   D. [place] fries  I. Peanut butter crackers N. Salami   E. Sweet pickles J. Tomato Juice   O. Pizza  12.  Seasonings that may be used freely on a reduced - sodium diet include   A. Lemon wedges F.Monosodium glutamate K. Celery seed    B.Soysauce   G. Pepper   L. Mustard powder   C. Sea salt  H. Cooking wine  M. Onion flakes   D. Vinegar  E. Prepared horseradish N. Salsa   E. Sage   J. Worcestershire sauce  O. [address]    Sumner Boast, PA-C  05/13/2022 2:02 PM    [facility] [address] [place], [place]  [address] Phone: [phone]; Fax: [phone]

## 2022-05-13 NOTE — Patient Instructions (Addendum)
Medication Instructions:  Your physician recommends that you continue on your current medications as directed. Please refer to the Current Medication list given to you today.  Pick up you Imdur today   *If you need a refill on your cardiac medications before your next appointment, please call your pharmacy*   Lab Work: NONE   If you have labs (blood work) drawn today and your tests are completely normal, you will receive your results only by: [place] (if you have MyChart) OR A paper copy in the mail If you have any lab test that is abnormal or we need to change your treatment, we will call you to review the results.   Testing/Procedures: NONE    Follow-Up: At [facility], you and your health needs are our priority.  As part of our continuing mission to provide you with exceptional heart care, we have created designated Provider Care Teams.  These Care Teams include your primary Cardiologist (physician) and Advanced Practice Providers (APPs -  Physician Assistants and Nurse Practitioners) who all work together to provide you with the care you need, when you need it.  We recommend signing up for the patient portal called "MyChart".  Sign up information is provided on this After Visit Summary.  MyChart is used to connect with patients for Virtual Visits (Telemedicine).  Patients are able to view lab/test results, encounter notes, upcoming appointments, etc.  Non-urgent messages can be sent to your provider as well.   To learn more about what you can do with MyChart, go to [email or web address].    Your next appointment:    As Scheduled   The format for your next appointment:   In Person  Provider:   Carlyle Dolly, MD    Other Instructions Thank you for choosing [facility]!     Important Information About Sugar      Two Gram Sodium Diet 2000 mg  What is Sodium? Sodium is a mineral found naturally in many foods. The most significant source of  sodium in the diet is table salt, which is about 40% sodium.  Processed, convenience, and preserved foods also contain a large amount of sodium.  The body needs only 500 mg of sodium daily to function,  A normal diet provides more than enough sodium even if you do not use salt.  Why Limit Sodium? A build up of sodium in the body can cause thirst, increased blood pressure, shortness of breath, and water retention.  Decreasing sodium in the diet can reduce edema and risk of heart attack or stroke associated with high blood pressure.  Keep in mind that there are many other factors involved in these health problems.  Heredity, obesity, lack of exercise, cigarette smoking, stress and what you eat all play a role.  General Guidelines: Do not add salt at the table or in cooking.  One teaspoon of salt contains over 2 grams of sodium. Read food labels Avoid processed and convenience foods Ask your dietitian before eating any foods not dicussed in the menu planning guidelines Consult your physician if you wish to use a salt substitute or a sodium containing medication such as antacids.  Limit milk and milk products to 16 oz (2 cups) per day.  Shopping Hints: READ LABELS!! "Dietetic" does not necessarily mean low sodium. Salt and other sodium ingredients are often added to foods during processing.    Menu Planning Guidelines Food Group Choose More Often Avoid  Beverages (see also the milk group All fruit juices,  low-sodium, salt-free vegetables juices, low-sodium carbonated beverages Regular vegetable or tomato juices, commercially softened water used for drinking or cooking  Breads and Cereals Enriched white, wheat, rye and pumpernickel bread, hard rolls and dinner rolls; muffins, cornbread and waffles; most dry cereals, cooked cereal without added salt; unsalted crackers and breadsticks; low sodium or homemade bread crumbs Bread, rolls and crackers with salted tops; quick breads; instant hot cereals;  pancakes; commercial bread stuffing; self-rising flower and biscuit mixes; regular bread crumbs or cracker crumbs  Desserts and Sweets Desserts and sweets mad with mild should be within allowance Instant pudding mixes and cake mixes  Fats Butter or margarine; vegetable oils; unsalted salad dressings, regular salad dressings limited to 1 Tbs; light, sour and heavy cream Regular salad dressings containing bacon fat, bacon bits, and salt pork; snack dips made with instant soup mixes or processed cheese; salted nuts  Fruits Most fresh, frozen and canned fruits Fruits processed with salt or sodium-containing ingredient (some dried fruits are processed with sodium sulfites        Vegetables Fresh, frozen vegetables and low- sodium canned vegetables Regular canned vegetables, sauerkraut, pickled vegetables, and others prepared in brine; frozen vegetables in sauces; vegetables seasoned with ham, bacon or salt pork  Condiments, Sauces, Miscellaneous  Salt substitute with physician's approval; pepper, herbs, spices; vinegar, lemon or lime juice; hot pepper sauce; garlic powder, onion powder, low sodium soy sauce (1 Tbs.); low sodium condiments (ketchup, chili sauce, mustard) in limited amounts (1 tsp.) fresh ground horseradish; unsalted tortilla chips, pretzels, potato chips, popcorn, salsa (1/4 cup) Any seasoning made with salt including garlic salt, celery salt, onion salt, and seasoned salt; sea salt, rock salt, kosher salt; meat tenderizers; monosodium glutamate; mustard, regular soy sauce, barbecue, sauce, chili sauce, teriyaki sauce, steak sauce, Worcestershire sauce, and most flavored vinegars; canned gravy and mixes; regular condiments; salted snack foods, olives, picles, relish, horseradish sauce, catsup   Food preparation: Try these seasonings Meats:    Pork Sage, onion Serve with applesauce  Chicken Poultry seasoning, thyme, parsley Serve with cranberry sauce  Lamb Curry powder, rosemary, garlic,  thyme Serve with mint sauce or jelly  Veal Marjoram, basil Serve with current jelly, cranberry sauce  Beef Pepper, bay leaf Serve with dry mustard, unsalted chive butter  Fish [place] leaf, dill Serve with unsalted lemon butter, unsalted parsley butter  Vegetables:    Asparagus Lemon juice   Broccoli Lemon juice   Carrots Mustard dressing parsley, mint, nutmeg, glazed with unsalted butter and sugar   Green beans Marjoram, lemon juice, nutmeg,dill seed   Tomatoes Basil, marjoram, onion   Spice /blend for [employer]" 4 tsp ground thyme 1 tsp ground sage 3 tsp ground rosemary 4 tsp ground marjoram   Test your knowledge A product that says "Salt Free" may still contain sodium. True or False Garlic Powder and Hot Pepper Sauce an be used as alternative seasonings.True or False Processed foods have more sodium than fresh foods.  True or False Canned Vegetables have less sodium than froze True or False   WAYS TO DECREASE YOUR SODIUM INTAKE Avoid the use of added salt in cooking and at the table.  Table salt (and other prepared seasonings which contain salt) is probably one of the greatest sources of sodium in the diet.  Unsalted foods can gain flavor from the sweet, sour, and butter taste sensations of herbs and spices.  Instead of using salt for seasoning, try the following seasonings with the foods listed.  Remember: how  you use them to enhance natural food flavors is limited only by your creativity... Allspice-Meat, fish, eggs, fruit, peas, red and yellow vegetables Almond Extract-Fruit baked goods Anise Seed-Sweet breads, fruit, carrots, beets, cottage cheese, cookies (tastes like licorice) Basil-Meat, fish, eggs, vegetables, rice, vegetables salads, soups, sauces Bay Leaf-Meat, fish, stews, poultry Burnet-Salad, vegetables (cucumber-like flavor) Caraway Seed-Bread, cookies, cottage cheese, meat, vegetables, cheese, rice Cardamon-Baked goods, fruit, soups Celery Powder or seed-Salads, salad  dressings, sauces, meatloaf, soup, bread.Do not use  celery salt Chervil-Meats, salads, fish, eggs, vegetables, cottage cheese (parsley-like flavor) Chili Power-Meatloaf, chicken cheese, corn, eggplant, egg dishes Chives-Salads cottage cheese, egg dishes, soups, vegetables, sauces Cilantro-Salsa, casseroles Cinnamon-Baked goods, fruit, pork, lamb, chicken, carrots Cloves-Fruit, baked goods, fish, pot roast, green beans, beets, carrots Coriander-Pastry, cookies, meat, salads, cheese (lemon-orange flavor) Cumin-Meatloaf, fish,cheese, eggs, cabbage,fruit pie (caraway flavor) [employer], fruit, eggs, fish, poultry, cottage cheese, vegetables Dill Seed-Meat, cottage cheese, poultry, vegetables, fish, salads, bread Fennel Seed-Bread, cookies, apples, pork, eggs, fish, beets, cabbage, cheese, Licorice-like flavor Garlic-(buds or powder) Salads, meat, poultry, fish, bread, butter, vegetables, potatoes.Do not  use garlic salt Ginger-Fruit, vegetables, baked goods, meat, fish, poultry Horseradish Root-Meet, vegetables, butter Lemon Juice or Extract-Vegetables, fruit, tea, baked goods, fish salads Mace-Baked goods fruit, vegetables, fish, poultry (taste like nutmeg) Maple Extract-Syrups Marjoram-Meat, chicken, fish, vegetables, breads, green salads (taste like Sage) Mint-Tea, lamb, sherbet, vegetables, desserts, carrots, cabbage Mustard, Dry or Seed-Cheese, eggs, meats, vegetables, poultry Nutmeg-Baked goods, fruit, chicken, eggs, vegetables, desserts Onion Powder-Meat, fish, poultry, vegetables, cheese, eggs, bread, rice salads (Do not use   Onion salt) Orange Extract-Desserts, baked goods Oregano-Pasta, eggs, cheese, onions, pork, lamb, fish, chicken, vegetables, green salads Paprika-Meat, fish, poultry, eggs, cheese, vegetables Parsley Flakes-Butter, vegetables, meat fish, poultry, eggs, bread, salads (certain forms may   Contain sodium Pepper-Meat fish, poultry, vegetables,  eggs Peppermint Extract-Desserts, baked goods Poppy Seed-Eggs, bread, cheese, fruit dressings, baked goods, noodles, vegetables, cottage  [employer], poultry, meat, fish, cauliflower, turnips,eggs bread Saffron-Rice, bread, veal, chicken, fish, eggs Sage-Meat, fish, poultry, onions, eggplant, tomateos, pork, stews Savory-Eggs, salads, poultry, meat, rice, vegetables, soups, pork Tarragon-Meat, poultry, fish, eggs, butter, vegetables (licorice-like flavor)  Thyme-Meat, poultry, fish, eggs, vegetables, (clover-like flavor), sauces, soups Tumeric-Salads, butter, eggs, fish, rice, vegetables (saffron-like flavor) Vanilla Extract-Baked goods, candy Vinegar-Salads, vegetables, meat marinades Walnut Extract-baked goods, candy   2. Choose your Foods Wisely   The following is a list of foods to avoid which are high in sodium:  Meats-Avoid all smoked, canned, salt cured, dried and kosher meat and fish as well as Anchovies   [employer] meats:Bologna, Liverwurst, Pastrami Canned meat or fish  Marinated herring Caviar    Pepperoni Corned Beef   Pizza Dried chipped beef  Salami Frozen breaded fish or meat Salt pork Frankfurters or hot dogs  Sardines Gefilte fish   Sausage Ham (boiled ham, Proscuitto Smoked butt    spiced ham)   Spam      TV Dinners Vegetables Canned vegetables (Regular) Relish Canned mushrooms  Sauerkraut Olives    Tomato juice Pickles  Bakery and Dessert Products Canned puddings  Cream pies Cheesecake   Decorated cakes Cookies  Beverages/Juices Tomato juice, regular  Gatorade   V-8 vegetable juice, regular  Breads and Cereals Biscuit mixes   Salted potato chips, corn chips, pretzels Bread stuffing mixes  Salted crackers and rolls Pancake and waffle mixes Self-rising flour  Seasonings Accent    Meat sauces Barbecue sauce  Meat tenderizer Catsup  Monosodium glutamate (MSG) Celery salt   Onion salt Chili  sauce   Prepared mustard Garlic salt   Salt, seasoned salt, sea salt Gravy mixes   Soy sauce Horseradish   Steak sauce Ketchup   Tartar sauce Lite salt    Teriyaki sauce Marinade mixes   Worcestershire sauce  Others Baking powder   Cocoa and cocoa mixes Baking soda   Commercial casserole mixes Candy-caramels, chocolate  Dehydrated soups    Bars, fudge,nougats  Instant rice and pasta mixes Canned broth or soup  Maraschino cherries Cheese, aged and processed cheese and cheese spreads  Learning Assessment Quiz  Indicated T (for True) or F (for False) for each of the following statements:  _____ Fresh fruits and vegetables and unprocessed grains are generally low in sodium _____ Water may contain a considerable amount of sodium, depending on the source _____ You can always tell if a food is high in sodium by tasting it _____ Certain laxatives my be high in sodium and should be avoided unless prescribed   by a physician or pharmacist _____ Salt substitutes may be used freely by anyone on a sodium restricted diet _____ Sodium is present in table salt, food additives and as a natural component of   most foods _____ Table salt is approximately 90% sodium _____ Limiting sodium intake may help prevent excess fluid accumulation in the body _____ On a sodium-restricted diet, seasonings such as bouillon soy sauce, and    cooking wine should be used in place of table salt _____ On an ingredient list, a product which lists monosodium glutamate as the first   ingredient is an appropriate food to include on a low sodium diet  Circle the best answer(s) to the following statements (Hint: there may be more than one correct answer)  11. On a low-sodium diet, some acceptable snack items are:    A. Olives  F. Bean dip   K. Grapefruit juice    B. Salted Pretzels G. Commercial Popcorn   L. Canned peaches    C. Carrot Sticks  H. Bouillon   M. Unsalted nuts   D. [place] fries  I. Peanut butter  crackers N. Salami   E. Sweet pickles J. Tomato Juice   O. Pizza  12.  Seasonings that may be used freely on a reduced - sodium diet include   A. Lemon wedges F.Monosodium glutamate K. Celery seed    B.Soysauce   G. Pepper   L. Mustard powder   C. Sea salt  H. Cooking wine  M. Onion flakes   D. Vinegar  E. Prepared horseradish N. Salsa   E. Sage   J. Worcestershire sauce  O. [place]

## 2022-05-17 DIAGNOSIS — M961 Postlaminectomy syndrome, not elsewhere classified: Secondary | ICD-10-CM | POA: Diagnosis not present

## 2022-05-21 DIAGNOSIS — M159 Polyosteoarthritis, unspecified: Secondary | ICD-10-CM | POA: Diagnosis not present

## 2022-05-21 DIAGNOSIS — I209 Angina pectoris, unspecified: Secondary | ICD-10-CM | POA: Diagnosis not present

## 2022-05-21 DIAGNOSIS — I10 Essential (primary) hypertension: Secondary | ICD-10-CM | POA: Diagnosis not present

## 2022-05-21 DIAGNOSIS — I700 Atherosclerosis of aorta: Secondary | ICD-10-CM | POA: Diagnosis not present

## 2022-05-21 DIAGNOSIS — N184 Chronic kidney disease, stage 4 (severe): Secondary | ICD-10-CM | POA: Diagnosis not present

## 2022-05-21 DIAGNOSIS — E1121 Type 2 diabetes mellitus with diabetic nephropathy: Secondary | ICD-10-CM | POA: Diagnosis not present

## 2022-05-21 DIAGNOSIS — E1140 Type 2 diabetes mellitus with diabetic neuropathy, unspecified: Secondary | ICD-10-CM | POA: Diagnosis not present

## 2022-05-21 DIAGNOSIS — G894 Chronic pain syndrome: Secondary | ICD-10-CM | POA: Diagnosis not present

## 2022-05-23 ENCOUNTER — Telehealth: Payer: Self-pay | Admitting: *Deleted

## 2022-05-23 NOTE — Telephone Encounter (Signed)
   Pre-operative Risk Assessment    Patient Name: [name]  DOB: 1946-04-04 MRN: [id number]      Request for Surgical Clearance    Procedure:   SPINAL CORD STIMULATOR PLACEMENT  Date of Surgery:  Clearance TBD                                Surgeon:  DR. Newman Pies Surgeon's Group or Practice Name:  [facility] Phone number:  [phone] Fax number:  [phone] ATTN: JESSICA   Type of Clearance Requested:   - Medical ; WILL NEED INSTRUCTIONS TO HOLD ASA PRIOR TO PROCEDURE   Type of Anesthesia:  General    Additional requests/questions:    [name]   05/23/2022, 4:13 PM

## 2022-05-23 NOTE — Telephone Encounter (Signed)
    Patient Name: [name]  DOB: 06/30/1946 MRN: [id number]  Primary Cardiologist: Carlyle Dolly, MD  Chart reviewed as part of pre-operative protocol coverage. Given past medical history and time since last visit, based on ACC/AHA guidelines, [name] would be at acceptable risk for the planned procedure without further cardiovascular testing.   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  Patient is okay to proceed with spinal cord stimulator placement without further work-up.  If possible, we would prefer he stay on aspirin through the procedure, however if absolutely needed, he may hold aspirin for 7 days prior to the procedure and restart as soon as possible afterward.  I will route this recommendation to the requesting party via Epic fax function and remove from pre-op pool.  Please call with questions.  [place], [place] 05/23/2022, 5:19 PM

## 2022-05-28 DIAGNOSIS — A881 Epidemic vertigo: Secondary | ICD-10-CM | POA: Diagnosis not present

## 2022-05-29 ENCOUNTER — Other Ambulatory Visit: Payer: Self-pay

## 2022-05-29 ENCOUNTER — Encounter (HOSPITAL_COMMUNITY): Payer: Self-pay | Admitting: Emergency Medicine

## 2022-05-29 ENCOUNTER — Other Ambulatory Visit: Payer: Self-pay | Admitting: Neurosurgery

## 2022-05-29 ENCOUNTER — Emergency Department (HOSPITAL_COMMUNITY): Payer: Medicare Other

## 2022-05-29 ENCOUNTER — Emergency Department (HOSPITAL_COMMUNITY)
Admission: EM | Admit: 2022-05-29 | Discharge: 2022-05-30 | Disposition: A | Discharge status: Home / Self Care | Payer: Medicare Other | Attending: Emergency Medicine | Admitting: Emergency Medicine

## 2022-05-29 DIAGNOSIS — I5030 Unspecified diastolic (congestive) heart failure: Secondary | ICD-10-CM | POA: Insufficient documentation

## 2022-05-29 DIAGNOSIS — E1122 Type 2 diabetes mellitus with diabetic chronic kidney disease: Secondary | ICD-10-CM | POA: Diagnosis not present

## 2022-05-29 DIAGNOSIS — I130 Hypertensive heart and chronic kidney disease with heart failure and stage 1 through stage 4 chronic kidney disease, or unspecified chronic kidney disease: Secondary | ICD-10-CM | POA: Diagnosis not present

## 2022-05-29 DIAGNOSIS — I209 Angina pectoris, unspecified: Secondary | ICD-10-CM | POA: Diagnosis not present

## 2022-05-29 DIAGNOSIS — R079 Chest pain, unspecified: Secondary | ICD-10-CM | POA: Diagnosis not present

## 2022-05-29 DIAGNOSIS — N184 Chronic kidney disease, stage 4 (severe): Secondary | ICD-10-CM | POA: Diagnosis not present

## 2022-05-29 DIAGNOSIS — I208 Other forms of angina pectoris: Secondary | ICD-10-CM | POA: Diagnosis not present

## 2022-05-29 DIAGNOSIS — Z7982 Long term (current) use of aspirin: Secondary | ICD-10-CM | POA: Diagnosis not present

## 2022-05-29 DIAGNOSIS — Z79899 Other long term (current) drug therapy: Secondary | ICD-10-CM | POA: Insufficient documentation

## 2022-05-29 LAB — CBC
HCT: 25.2 % — ABNORMAL LOW (ref 39.0–52.0)
Hemoglobin: 8.2 g/dL — ABNORMAL LOW (ref 13.0–17.0)
MCH: 32.7 pg (ref 26.0–34.0)
MCHC: 32.5 g/dL (ref 30.0–36.0)
MCV: 100.4 fL — ABNORMAL HIGH (ref 80.0–100.0)
Platelets: 216 10*3/uL (ref 150–400)
RBC: 2.51 MIL/uL — ABNORMAL LOW (ref 4.22–5.81)
RDW: 14.9 % (ref 11.5–15.5)
WBC: 5.5 10*3/uL (ref 4.0–10.5)
nRBC: 0 % (ref 0.0–0.2)

## 2022-05-29 LAB — BASIC METABOLIC PANEL
Anion gap: 14 (ref 5–15)
BUN: 81 mg/dL — ABNORMAL HIGH (ref 8–23)
CO2: 24 mmol/L (ref 22–32)
Calcium: 9.4 mg/dL (ref 8.9–10.3)
Chloride: 99 mmol/L (ref 98–111)
Creatinine, Ser: 3.88 mg/dL — ABNORMAL HIGH (ref 0.61–1.24)
GFR, Estimated: 15 mL/min — ABNORMAL LOW (ref >60)
Glucose, Bld: 138 mg/dL — ABNORMAL HIGH (ref 70–99)
Potassium: 4 mmol/L (ref 3.5–5.1)
Sodium: 137 mmol/L (ref 135–145)

## 2022-05-29 LAB — TROPONIN I (HIGH SENSITIVITY)
Troponin I (High Sensitivity): 21 ng/L — ABNORMAL HIGH (ref <18)
Troponin I (High Sensitivity): 26 ng/L — ABNORMAL HIGH (ref <18)

## 2022-05-29 MED ORDER — NITROGLYCERIN 0.4 MG SL SUBL
0.4000 mg | SUBLINGUAL_TABLET | Freq: Once | SUBLINGUAL | Status: AC
  Administered 2022-05-29: 0.4 mg via SUBLINGUAL
  Filled 2022-05-29: qty 1

## 2022-05-29 MED ORDER — NITROGLYCERIN 0.4 MG SL SUBL
0.4000 mg | SUBLINGUAL_TABLET | SUBLINGUAL | Status: DC | PRN
  Filled 2022-05-29: qty 1

## 2022-05-29 NOTE — Discharge Instructions (Addendum)
Call Dr. Harl Bowie tomorrow morning for further management of your symptoms.  They can advise you if they would like you to increase the dosing of your Imdur.  Your lab tests and ekg tonight are reassuring.

## 2022-05-29 NOTE — ED Triage Notes (Signed)
Pt present with mid-sternal chest pain that started 1 hour ago after walking, seen for same 2 weeks ago, took 1 Nitro and chest pain has decreased.

## 2022-05-29 NOTE — ED Provider Notes (Signed)
  Face-to-face evaluation   History: Patient presents for evaluation of chest pain.  Similar pain previously.  He has nitroglycerin when he has this type of chest pain.  Hospitalization 5/18 to 05/11/2022.  Evaluation for chest pain.  Cardiology eval at that time recommended initiating Imdur then gradually titrate up.  I saw the patient at 11:25 PM.  He states at this time he is not having any pain.  Physical exam: Alert, calm, cooperative.  Chest is nontender to palpation.  Heart regular rate and rhythm without murmur.  Lungs clear to auscultation.  Extremities with peripheral edema of the legs.  MDM: Evaluation for  Chief Complaint  Patient presents with   Chest Pain     Patient with renal insufficiency, and cardiac disease, being managed medically with recently increased nitrate treatment using Imdur.  Today he took nitroglycerin.  He has been using it sporadically.  He is due to have a spinal stimulator placed on 06/10/2022.  He was cleared for surgery by cardiology on 05/23/2022.  At this time his troponin is elevated but flat.  He has renal insufficiency which is stable.  He does not appear to be in any distress.  Patient will be discharged with symptomatic treatment and encouraged him to follow-up with his cardiologist for management as needed for problems.  Medical screening examination/treatment/procedure(s) were conducted as a shared visit with non-physician practitioner(s) and myself.  I personally evaluated the patient during the encounter    Daleen Bo, MD 06/01/22 1132

## 2022-05-29 NOTE — ED Provider Notes (Incomplete)
[facility] EMERGENCY DEPARTMENT Provider Note   CSN: [id number] Arrival date & time: 05/29/22  1851     History {Add pertinent medical, surgical, social history, OB history to HPI:1} Chief Complaint  Patient presents with  . Chest Pain    [name] is a 76 y.o. male.  The history is provided by the patient.      Home Medications Prior to Admission medications   Medication Sig Start Date End Date Taking? Authorizing Provider  acarbose (PRECOSE) 50 MG tablet Take 50 mg by mouth in the morning, at noon, and at bedtime.  10/20/17   [provider]  acetaminophen (TYLENOL) 650 MG CR tablet Take 1,300 mg by mouth every 8 (eight) hours as needed for pain.    [provider]  allopurinol (ZYLOPRIM) 100 MG tablet Take 100-150 mg by mouth 2 (two) times daily. Takes 1.5 tablet in am and 1 tablet in evening 08/17/20   [provider]  amLODipine (NORVASC) 10 MG tablet Take 10 mg by mouth every evening.     [provider]  aspirin EC 81 MG tablet Take 1 tablet (81 mg total) by mouth daily. 09/14/19   Herminio Commons, MD  calcitRIOL (ROCALTROL) 0.25 MCG capsule Take 0.25 mcg by mouth every Sunday.  12/13/16   [provider]  carvedilol (COREG) 6.25 MG tablet Take 6.25 mg by mouth 2 (two) times daily with a meal. 04/18/22 04/18/23  [provider]  cholecalciferol (VITAMIN D) 1000 units tablet Take 1,000 Units daily by mouth.    [provider]  cloNIDine (CATAPRES) 0.1 MG tablet Take 0.1 mg by mouth 3 (three) times daily. 02/15/20   [provider]  dicyclomine (BENTYL) 10 MG capsule Take 10 mg by mouth 3 (three) times daily.    [provider]  furosemide (LASIX) 40 MG tablet Take 1 tablet (40 mg total) by mouth daily. 05/11/22   Pokhrel, Corrie Mckusick, MD  gabapentin (NEURONTIN) 300 MG capsule Take 300 mg by mouth 3 (three) times daily.  10/19/19   [provider]  hydrALAZINE (APRESOLINE) 50 MG tablet  Take 100 mg by mouth 2 (two) times daily. 01/24/22   [provider]  insulin NPH Human (NOVOLIN N) 100 UNIT/ML injection Inject 0.18 mLs (18 Units total) into the skin every morning. Ands syringes 1/day 06/27/21   Renato Shin, MD  isosorbide mononitrate (IMDUR) 30 MG 24 hr tablet Take 1 tablet (30 mg total) by mouth daily. Patient not taking: Reported on 05/13/2022 05/12/22   Flora Lipps, MD  lubiprostone (AMITIZA) 8 MCG capsule Take by mouth.    [provider]  meclizine (ANTIVERT) 25 MG tablet Take 25 mg by mouth 3 (three) times daily as needed for dizziness.    [provider]  nitroGLYCERIN (NITROSTAT) 0.4 MG SL tablet Place 1 tablet (0.4 mg total) under the tongue every 5 (five) minutes as needed for chest pain. PLACE ONE TABLET UNDER THE TONGUE EVERY 5 MINUTES FOR 3 DOSES AS NEEDED Strength: 0.4 mg 02/07/22   Arnoldo Lenis, MD  ondansetron (ZOFRAN) 4 MG tablet TAKE ONE TABLET (4MG TOTAL) BY MOUTH EVERY 8 HOURS AS NEEDED FOR NAUSEA OR VOMITING 04/24/21   Annitta Needs, NP  pantoprazole (PROTONIX) 40 MG tablet TAKE ONE TABLET (40MG TOTAL) BY MOUTH TWO TIMES DAILY BEFORE A MEAL Patient taking differently: Take 40 mg by mouth 2 (two) times daily. 05/11/21   Erenest Rasher, PA-C  pravastatin (PRAVACHOL) 40 MG tablet Take  1 tablet (40 mg total) by mouth every evening. 07/31/21 02/09/28  Imogene Burn, PA-C  TRUE METRIX BLOOD GLUCOSE TEST test strip USE TO TEST BLOOD SUGAR UP TO TWICE DAILY OR AS DIRECTED 08/31/21   Renato Shin, MD      Allergies    Metformin, Niacin, and Niacin and related    Review of Systems   Review of Systems  Physical Exam Updated Vital Signs BP (!) 152/68   Pulse 63   Temp 97.9 F (36.6 C) (Oral)   Resp 11   Ht 6' 2.5" (1.892 m)   Wt 121.1 kg   SpO2 99%   BMI 33.82 kg/m  Physical Exam  ED Results / Procedures / Treatments   Labs (all labs ordered are listed, but only abnormal results are displayed) Labs Reviewed  BASIC  METABOLIC PANEL - Abnormal; Notable for the following components:      Result Value   Glucose, Bld 138 (*)    BUN 81 (*)    Creatinine, Ser 3.88 (*)    GFR, Estimated 15 (*)    All other components within normal limits  CBC - Abnormal; Notable for the following components:   RBC 2.51 (*)    Hemoglobin 8.2 (*)    HCT 25.2 (*)    MCV 100.4 (*)    All other components within normal limits  TROPONIN I (HIGH SENSITIVITY) - Abnormal; Notable for the following components:   Troponin I (High Sensitivity) 21 (*)    All other components within normal limits  TROPONIN I (HIGH SENSITIVITY) - Abnormal; Notable for the following components:   Troponin I (High Sensitivity) 26 (*)    All other components within normal limits    EKG None  Radiology DG Chest 2 View  Result Date: 05/29/2022 CLINICAL DATA:  Chest pain EXAM: CHEST - 2 VIEW COMPARISON:  05/09/2022, 02/06/2022 FINDINGS: Cardiomegaly. No focal airspace disease or pleural effusion. No pneumothorax. IMPRESSION: No active cardiopulmonary disease.  Cardiomegaly Electronically Signed   By: Donavan Foil M.D.   On: 05/29/2022 19:50    Procedures Procedures  {Document cardiac monitor, telemetry assessment procedure when appropriate:1}  Medications Ordered in ED Medications  nitroGLYCERIN (NITROSTAT) SL tablet 0.4 mg (has no administration in time range)  nitroGLYCERIN (NITROSTAT) SL tablet 0.4 mg (0.4 mg Sublingual Given 05/29/22 2222)    ED Course/ Medical Decision Making/ A&P Clinical Course as of 05/29/22 2308  Wed May 29, 2022  2250 Chest 3/10 to 2/10 after 1st ntg [JI]    Clinical Course User Index [JI] Evalee Jefferson, PA-C                           Medical Decision Making Amount and/or Complexity of Data Reviewed Labs: ordered. Radiology: ordered.  Risk Prescription drug management.   ***  {Document critical care time when appropriate:1} {Document review of labs and clinical decision tools ie heart score, Chads2Vasc2  etc:1}  {Document your independent review of radiology images, and any outside records:1} {Document your discussion with family members, caretakers, and with consultants:1} {Document social determinants of health affecting pt's care:1} {Document your decision making why or why not admission, treatments were needed:1} Final Clinical Impression(s) / ED Diagnoses Final diagnoses:  None    Rx / DC Orders ED Discharge Orders     None

## 2022-05-30 ENCOUNTER — Telehealth: Payer: Self-pay | Admitting: Cardiology

## 2022-05-30 MED ORDER — ISOSORBIDE MONONITRATE ER 60 MG PO TB24: 60.0000 mg | ORAL_TABLET | Freq: Every day | ORAL | 3 refills | Status: DC

## 2022-05-30 NOTE — Telephone Encounter (Signed)
Per Branch, verify taking imdur, if so increase to 39m daily. if not have him fill the prior Rx and start  Confirmed that he is taking imdur 30 mg daily. Advised to take 2 daily and new rx will be sent for increase imdur to 60 mg daily. Verbalized understanding of plan.

## 2022-05-30 NOTE — Telephone Encounter (Signed)
Patient walked into the [facility] stating that he was seen in the ER at [facility] last evening for chest pains. Patient states that he was told to see Dr. Harl Bowie today. According, to his discharge notes he was advised to call the office about a possible medication change. Patient left the office stating that he was going to try and locate Dr. Harl Bowie himself.

## 2022-05-30 NOTE — Telephone Encounter (Signed)
[name] says he wants stronger medication for his chest pain.Seen yesterday in ED and discharged.He walked in, attempted to find Dr.Branch stating he wants different medications . I told him I would message Dr.Branch, he appeared satisfied.

## 2022-05-31 ENCOUNTER — Other Ambulatory Visit: Payer: Self-pay | Admitting: Neurosurgery

## 2022-05-31 NOTE — ED Provider Notes (Signed)
[place] EMERGENCY DEPARTMENT Provider Note   CSN: [id number] Arrival date & time: 05/29/22  1851     History  Chief Complaint  Patient presents with   Chest Pain    [name] is a 76 y.o. male with a history of CAD s/p angioplasty, Type 2 DM,  HTN, chronic kidney disease, severe stage 4 (pt has declined dialysis), sleep apnea, diastolic CHF, presenting with a one hour history of midsternal chest pain which started after he went for a walk.  He has not had sob, n/v, diaphoresis or palpitations with this discomfort. He had similar sx last month, was admitted but ruled out for MI, was placed on Imdur 30 mg qd with plans to titrate up if needed for angina control and was increased to 60 mg qd when seen by Dr. Harl Bowie in follow up.  Of note, pt also had a low risk Lexiscan Myoview on 05/06/22, given CKD, medical management regarding angina is anticipated.  Pt took one ntg prior to arrival and did get mild improvement in his pain.  The history is provided by the patient.       Home Medications Prior to Admission medications   Medication Sig Start Date End Date Taking? Authorizing Provider  acarbose (PRECOSE) 50 MG tablet Take 50 mg by mouth in the morning, at noon, and at bedtime.  10/20/17   [provider]  acetaminophen (TYLENOL) 650 MG CR tablet Take 1,300 mg by mouth every 8 (eight) hours as needed for pain.    [provider]  allopurinol (ZYLOPRIM) 100 MG tablet Take 100 mg by mouth daily. 08/17/20   [provider]  amLODipine (NORVASC) 10 MG tablet Take 10 mg by mouth every evening.     [provider]  aspirin EC 81 MG tablet Take 1 tablet (81 mg total) by mouth daily. 09/14/19   Herminio Commons, MD  calcitRIOL (ROCALTROL) 0.25 MCG capsule Take 0.25 mcg by mouth every Sunday.  12/13/16   [provider]  carboxymethylcellulose (REFRESH PLUS) 0.5 % SOLN Place 1 drop into both eyes daily as needed (dry eyes).    [provider]  carvedilol (COREG) 6.25 MG tablet Take 6.25 mg by mouth 2 (two) times daily with a meal. 04/18/22 04/18/23  [provider]  cholecalciferol (VITAMIN D) 1000 units tablet Take 1,000 Units daily by mouth.    [provider]  cloNIDine (CATAPRES) 0.1 MG tablet Take 0.1 mg by mouth 3 (three) times daily. 02/15/20   [provider]  furosemide (LASIX) 40 MG tablet Take 1 tablet (40 mg total) by mouth daily. Patient taking differently: Take 40 mg by mouth 2 (two) times daily. 05/11/22   Pokhrel, Corrie Mckusick, MD  gabapentin (NEURONTIN) 300 MG capsule Take 300 mg by mouth 3 (three) times daily.  10/19/19   [provider]  hydrALAZINE (APRESOLINE) 100 MG tablet Take 200 mg by mouth 2 (two) times daily. 01/24/22   [provider]  HYDROcodone-acetaminophen (NORCO) 10-325 MG tablet Take 1-2 tablets by mouth 4 (four) times daily as needed for pain. 05/27/22   [provider]  insulin NPH Human (NOVOLIN N) 100 UNIT/ML injection Inject 0.18 mLs (18 Units total) into the skin every morning. Ands syringes 1/day Patient taking differently: Inject 20 Units into the skin daily as needed (if CBG is high). Per sliding scale: not provided 06/27/21   Renato Shin, MD  isosorbide mononitrate (IMDUR) 60 MG 24 hr tablet Take 1 tablet (60 mg  total) by mouth daily. 05/30/22   Arnoldo Lenis, MD  lubiprostone (AMITIZA) 8 MCG capsule Take 8 mcg by mouth daily as needed for constipation.    [provider]  meclizine (ANTIVERT) 25 MG tablet Take 25 mg by mouth 3 (three) times daily as needed for dizziness.    [provider]  nitroGLYCERIN (NITROSTAT) 0.4 MG SL tablet Place 1 tablet (0.4 mg total) under the tongue every 5 (five) minutes as needed for chest pain. PLACE ONE TABLET UNDER THE TONGUE EVERY 5 MINUTES FOR 3 DOSES AS NEEDED Strength: 0.4 mg 02/07/22   Arnoldo Lenis, MD  ondansetron (ZOFRAN) 4 MG tablet TAKE ONE TABLET (4MG TOTAL) BY MOUTH  EVERY 8 HOURS AS NEEDED FOR NAUSEA OR VOMITING Patient taking differently: Take 4 mg by mouth every 8 (eight) hours as needed for nausea or vomiting. 04/24/21   Annitta Needs, NP  pantoprazole (PROTONIX) 40 MG tablet TAKE ONE TABLET (40MG TOTAL) BY MOUTH TWO TIMES DAILY BEFORE A MEAL Patient taking differently: Take 40 mg by mouth 2 (two) times daily. 05/11/21   Erenest Rasher, PA-C  pravastatin (PRAVACHOL) 40 MG tablet Take 1 tablet (40 mg total) by mouth every evening. 07/31/21 02/09/28  Imogene Burn, PA-C  TRUE METRIX BLOOD GLUCOSE TEST test strip USE TO TEST BLOOD SUGAR UP TO TWICE DAILY OR AS DIRECTED 08/31/21   Renato Shin, MD      Allergies    Metformin, Niacin, and Niacin and related    Review of Systems   Review of Systems  Constitutional:  Negative for diaphoresis and fever.  HENT:  Negative for congestion and sore throat.   Eyes: Negative.   Respiratory:  Negative for chest tightness and shortness of breath.   Cardiovascular:  Positive for chest pain and leg swelling.       Chronic lower extremity edema. Pt states stable.  Gastrointestinal:  Negative for abdominal pain, nausea and vomiting.  Genitourinary: Negative.   Musculoskeletal:  Negative for arthralgias, joint swelling and neck pain.  Skin: Negative.  Negative for rash and wound.  Neurological:  Negative for dizziness, weakness, light-headedness, numbness and headaches.  Psychiatric/Behavioral: Negative.      Physical Exam Updated Vital Signs BP (!) 149/60 (BP Location: Right Arm)   Pulse 64   Temp 98.3 F (36.8 C) (Oral)   Resp 14   Ht 6' 2.5" (1.892 m)   Wt 121.1 kg   SpO2 100%   BMI 33.82 kg/m  Physical Exam Vitals and nursing note reviewed.  Constitutional:      Appearance: He is well-developed.  HENT:     Head: Normocephalic and atraumatic.  Eyes:     Conjunctiva/sclera: Conjunctivae normal.  Cardiovascular:     Rate and Rhythm: Normal rate and regular rhythm.     Heart sounds: Normal heart  sounds.  Pulmonary:     Effort: Pulmonary effort is normal.     Breath sounds: Normal breath sounds. No wheezing.  Abdominal:     General: Bowel sounds are normal.     Palpations: Abdomen is soft.     Tenderness: There is no abdominal tenderness.  Musculoskeletal:        General: Normal range of motion.     Cervical back: Normal range of motion.     Right lower leg: Edema present.     Left lower leg: Edema present.  Skin:    General: Skin is warm and dry.  Neurological:     Mental  Status: He is alert.     ED Results / Procedures / Treatments   Labs (all labs ordered are listed, but only abnormal results are displayed) Labs Reviewed  BASIC METABOLIC PANEL - Abnormal; Notable for the following components:      Result Value   Glucose, Bld 138 (*)    BUN 81 (*)    Creatinine, Ser 3.88 (*)    GFR, Estimated 15 (*)    All other components within normal limits  CBC - Abnormal; Notable for the following components:   RBC 2.51 (*)    Hemoglobin 8.2 (*)    HCT 25.2 (*)    MCV 100.4 (*)    All other components within normal limits  TROPONIN I (HIGH SENSITIVITY) - Abnormal; Notable for the following components:   Troponin I (High Sensitivity) 21 (*)    All other components within normal limits  TROPONIN I (HIGH SENSITIVITY) - Abnormal; Notable for the following components:   Troponin I (High Sensitivity) 26 (*)    All other components within normal limits    EKG EKG Interpretation  Date/Time:  Wednesday May 29 2022 19:16:09 EDT Ventricular Rate:  65 PR Interval:    QRS Duration: 106 QT Interval:  434 QTC Calculation: 451 R Axis:   -15 Text Interpretation: Undetermined rhythm Left ventricular hypertrophy with repolarization abnormality ( R in aVL ) Cannot rule out Septal infarct , age undetermined Abnormal ECG When compared with ECG of 29-May-2022 19:04, Current undetermined rhythm precludes rhythm comparison, needs review QRS axis Shifted right Minimal criteria for Septal  infarct are now Present Criteria for Lateral infarct are no longer Present Non-specific change in ST segment in Lateral leads Since last tracing 05/09/22 No significant change since that was done Confirmed by Daleen Bo [phone]) on 05/29/2022 11:51:12 PM  Radiology No results found.    Procedures Procedures    Medications Ordered in ED Medications  nitroGLYCERIN (NITROSTAT) SL tablet 0.4 mg (0.4 mg Sublingual Given 05/29/22 2222)    ED Course/ Medical Decision Making/ A&P Clinical Course as of 05/31/22 2209  Wed May 29, 2022  2250 Chest 3/10 to 2/10 after 1st ntg [JI]    Clinical Course User Index [JI] Evalee Jefferson, PA-C                           Medical Decision Making Pt with known CAD presenting with chest pain, improved with ntg.  New med Imdur since recent hospitalization with plans to titrate as needed, medical management planned per cardiology notes given CKD, pt not a good candidate for cardiac cath.  He has no sob, no orthopnea, doubt CHF exacerbation. He was given ntg x 2 SL here with chest pain resolution.  Pt felt to have stable angina. Advised to contact his cardiologist to consided titrating up Imdur.  Chest pain free at time of dc.   Amount and/or Complexity of Data Reviewed External Data Reviewed: notes.    Details: recent cardiology ov f/u s/p hospitalization Labs: ordered.    Details: delta troponins are stable today, negative change, elevated in he 21-26 range, but reduced compared to recent trops during admission.  creatinine 3.88 - chronic finding. Radiology: ordered.    Details: no failure noted  Risk Prescription drug management.           Final Clinical Impression(s) / ED Diagnoses Final diagnoses:  Stable angina pectoris ([place])    Rx / DC Orders ED Discharge Orders  Ordered    Ambulatory referral to Cardiology       Comments: If you have not heard from the Cardiology office within the next 72 hours please call [phone].    05/29/22 2335              Evalee Jefferson, PA-C 05/31/22 2301    Daleen Bo, MD 06/01/22 1133

## 2022-06-03 NOTE — Progress Notes (Incomplete)
Anesthesia Chart Review:  Case: [id number] Date/Time: 06/10/22 1015   Procedure: Spinal cord stimulator placement - RM 19/ to follow   Anesthesia type: General   Pre-op diagnosis: Failed back syndrome   Location: [place] [facility] OR ROOM 19 / [place] OR   Surgeons: [name] Pies, MD       DISCUSSION: Patient is a 76 year old male scheduled for the above procedure.  History includes former smoker (quit 12/23/92), HTN, CAD (NSTEMI, s/p DES LAD & D1 11/16/12; on Imdur for stable angina 04/2022, LHC deferred due to advised kidney disease), diastolic CHF, NSVT, DM2, GERD, CKD, hypercholesterolemia, BPH, spinal surgery (L3-5 PLIF 03/22/02; L2-3 PLIF 03/10/06), right hydrocele (s/p right hydrocelectomy 03/25/03), obesity.   [place] admission 05/09/22-05/11/22 for chest pain. He had had a recent stress test on 05/07/22 showing prior inferior infarct and small apical infarct with mild peri-infarct ischemia, LVEF 55%. He reported daily chest pain for the past few weeks but rapid worsening improved with Nitro prior to admission. Troponins 25-32-39, similar to previous evaluation. Creatinine 4.1, up from ~ 2.6-2.8. CXR showed subsegmental atelectasis at the right lung base, minimal blunting left posterior costophrenic angle potentially from scarring or trace pleural effusion. EKG showed NSR with first degree AV block, PVCs, and incomplete LBBB. Cardiology consulted and seen by [name] Buddy, PA-C/Dr. Daisy [name] on 05/10/22. Consult note states, "Reviewed with the patient extensively that he is not an ideal candidate for a cardiac catheterization given his renal function as he would be at high-risk for contrast-induced nephropathy. He voiced understanding of this. Reviewed at this time that the best option would be medical therapy." Also noted that worsening anemia since May 2022, likely from chronic disease, with HGB 8.5, previously ~ 10-12. ASA, Coreg, amlodipine, clonidine, hydralazine, pravastatin, Lasix continued, and  Imdur added. He had office follow-up with [name] Barrios, PA-C on 04/23/22 and had not started Imdur yet, but had not had recurrent chest pain. Recommended starting Imdur as previously advised and continue medical therapy given wanting to avoid cath given CKD and him having "no intention of going on dialysis." Deferred anemia management to primary/nephrology.   Had preoperative cardiologist risk assessment by [name], [place] on 05/23/22, "Given past medical history and time since last visit, based on ACC/AHA guidelines, [name] would be at acceptable risk for the planned procedure without further cardiovascular testing.    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   Patient is okay to proceed with spinal cord stimulator placement without further work-up.  If possible, we would prefer he stay on aspirin through the procedure, however if absolutely needed, he may hold aspirin for 7 days prior to the procedure and restart as soon as possible afterward."  Since then, he had an ED visit 05/29/22-05/30/22 for chest pain, similar to previous although last episode occurred after walking. He doesn't recall doing much with this episode. Chest pain relieved with two Nitro in the ED. Troponin 21-26. ED note suggested he was taking Imdur. He was advised to follow-up with cardiology regarding consideration of further titration his Imdur. Patient contacted Dr. Nelly [name] office after ED discharge, and Imdur was increased to 60 mg daily. I called [name] on 06/03/22. He says the increased Imdur as "really helped." He denied further chest pain, although admits his activity is pretty limited. He is hoping to eventually get back to playing golf, but says he is primarily just walking  around his house now and will likely need a wheelchair to get down the hospital hallways. He denied SOB, syncope. He has  chronic mild LE edema which he feels is stable. No change in urination pattern. He has issues with vertigo for at least the past year for which he takes meclizine, but denied new symptoms.   Last nephrology visit seen with Dr. Theador Hawthorne was from 04/18/22. He noted baseline Creatinine ~ 2.6-3.0 and HGB 10.0 as of that visit. Per 05/29/22 ED labs, Creatinine up to 3.88, BUN 81 and H/H 8.2/25.2 (stable since 05/11/22), but has been trending down over the past year with HGB 11.7 06/29/21, 10.0 04/17/22, 8.7 05/09/22, 8.1 05/11/22.   PAT visit is scheduled for 06/04/22; however, I reviewed available information with anesthesiologist Tamela Gammon, MD. I have reached back out to Dr. Harl Bowie to inquire if any new pre-operative recommendations given change in status since initial cardiology input, although symptoms currently controlled on Imdur 60 mg. I also left a message for the nurse with nephrologist Dr. Theador Hawthorne given Creatinine and anemia both a little worse since he was last seen there in late April. I wanted to see if any preoperative nephrology recommendations, or if something he will continue to follow-up out-patient. [name] says his next nephrology visit is within the next month.   I have updated Nikki at Dr. Arnoldo Morale' office. Will follow-up at PAT and pending further input from cardiology and nephrology. Consider repeat labs at PAT, if no nephrology follow-up is planned prior to surgery.    VS: There were no vitals taken for this visit. BP Readings from Last 3 Encounters:  05/30/22 (!) 149/60  05/13/22 140/60  05/11/22 (!) 152/51      PROVIDERS: Redmond School, MD is PCP  Carlyle Dolly, MD is cardiologist ([facility]) Ulice Bold, MD is nephrologist [facility], see Care Everywhere). Last visit 04/18/22 for routine follow-up. Coreg added because BP not at goal. Creatinine 3.07, stable at 2.6-3.0 (peaked at 3.59 on 01/18/22). Anemia profile planned due to HGB at 10.0 on  04/15/22. Advised to follow-up with cardiology due to chest pain.    LABS: {CH[facility] AN LABS REVIEWED:112001::"Labs reviewed: Acceptable for surgery."} (all labs ordered are listed, but only abnormal results are displayed)  Labs Reviewed - No data to display  Lab results from ED visit on 05/29/22 include: Lab Results  Component Value Date   WBC 5.5 05/29/2022   HGB 8.2 (L) 05/29/2022   HCT 25.2 (L) 05/29/2022   PLT 216 05/29/2022   GLUCOSE 138 (H) 05/29/2022   ALT 15 05/10/2022   AST 20 05/10/2022   NA 137 05/29/2022   K 4.0 05/29/2022   CL 99 05/29/2022   CREATININE 3.88 (H) 05/29/2022   BUN 81 (H) 05/29/2022   CO2 24 05/29/2022   TSH 3.603 05/10/2022   INR 1.0 02/06/2022   HGBA1C 5.8 (H) 02/07/2022     IMAGES: CXR 05/29/22: FINDINGS: Cardiomegaly. No focal airspace disease or pleural effusion. No pneumothorax.  IMPRESSION: No active cardiopulmonary disease.  Cardiomegaly   EKG: 05/29/22 19:16:09 Undetermined rhythm Left ventricular hypertrophy with repolarization abnormality ( R in aVL ) Cannot rule out Septal infarct , age undetermined Abnormal ECG When compared with ECG of 29-May-2022 19:04, Current undetermined rhythm precludes rhythm comparison, needs review QRS axis Shifted right Minimal criteria for Septal infarct are now Present Criteria for Lateral infarct are no longer Present Non-specific change in ST segment in Lateral leads ... Confirmed By: Daleen Bo - Rhythm  appears to be SR with significant first degree AV block (440 ms). Possible < 2 second pause at end of strip (unable to calculate as tracing ends after the P wave). LVH and high lateral T wave inversion is old.    CV: Nuclear stress test 05/07/22:   Findings are consistent with prior inferior myocardial infarction. There is a small apical infarct with mild peri-infarct ischemia.  The study is low risk.   No ST deviation was noted.   LV perfusion is abnormal.   Left ventricular function is normal.  Nuclear stress EF: 55 %. The left ventricular ejection fraction is normal (55-65%). End diastolic cavity size is severely enlarged. - Reviewed by Dr. Harl Bowie on 05/07/22, "Stress test shows signs of old damage on the bottom of the heart. Very small current blockage at the tip of the heart small enough area that would not be considered significant risk. Main management would be just medications for now, can he update [place] on any chest pain or SOB that is ongonig".   Echo 02/07/22: IMPRESSIONS   1. Left ventricular ejection fraction, by estimation, is 60 to 65%. The  left ventricle has normal function. The left ventricle has no regional  wall motion abnormalities. There is moderate left ventricular hypertrophy.  Left ventricular diastolic  parameters are indeterminate.   2. Right ventricular systolic function is normal. The right ventricular  size is normal.   3. Left atrial size was moderately dilated.   4. The mitral valve is normal in structure. No evidence of mitral valve  regurgitation. No evidence of mitral stenosis.   5. The aortic valve was not well visualized. There is moderate  calcification of the aortic valve. There is moderate thickening of the  aortic valve. Aortic valve regurgitation is not visualized. Aortic valve  sclerosis is present, with no evidence of  aortic valve stenosis.   6. The inferior vena cava is normal in size with greater than 50%  respiratory variability, suggesting right atrial pressure of 3 mmHg.    Cardiac cath/PCI 11/16/12: Final Conclusions:   1. Severe stenoses of the LAD and first diagonal branches, treated successfully with drug-eluting stents. (LM widely patent with minor luminal irregularities.  Proximal LAD with 40% stenosis just beyond the first diagonal.  First diagonal had 95% stenosis and suspicious for "culprit lesion".  The mid LAD had a focal eccentric 80% stenosis with hypodensity.  Further down to the junction of the mid and distal LAD there  was diffuse 50% stenosis. S/p PTCA/DES to first diagonal branch and mid LAD.) 2. Diffuse nonobstructive stenoses of the left circumflex and right coronary artery. (LCX patent with diffuse plaque without high-grade disease.  There are 2 obtuse marginal branches with 50% stenosis in the area of the OM bifurcation.  RCA is dominent. Diffuse 40-50% proximal stenosis, diffuse irregularities throughout the mid and distal vessel.  75% proximal PDA stenosis.  Nonobstructive disease in the PLA branch.)   3. Mild segmental left ventricular dysfunction with overall preserved LVEF 55%.     Past Medical History:  Diagnosis Date   Anxiety    Benign prostatic hypertrophy    Nocturia   CAD (coronary artery disease)    a.  NSTEMI 10/2012 s/p DES to LAD & DES to 1st diagonal with residual diffuse nonobstructive dz in LCx/RCA    Chronic kidney disease, stage 3, mod decreased GFR (HCC)    Creatinine of 1.5 in 03/2009, STAGE 4 now   Degenerative joint disease    Depression  hx of   Diabetes mellitus, type II ([place]) 12/10/2012.   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 Oneida Alar- TCS= left sided colitis and mild proctitis likely due to ischemic colitis, moderate internal hemorrhoids, small nodule in the  rectum, bx=tubular adenoma, EGD=-mild non-erosive gastritis   COLONOSCOPY WITH PROPOFOL N/A 07/24/2020   hemorrhoids,  rectosigmoid lipoma, small adenoma removed.   ESOPHAGOGASTRODUODENOSCOPY (EGD) WITH PROPOFOL N/A 02/05/2021   Normal esophagus, erythematous mucosa in entire stomach, no obvious ulcer, s/p biopsy. Normal duodenum. Negative H.pylori.    HERNIA REPAIR     LEFT HEART CATHETERIZATION WITH CORONARY ANGIOGRAM N/A 11/16/2012   Procedure: LEFT HEART CATHETERIZATION WITH CORONARY ANGIOGRAM;  Surgeon: Sherren Mocha, MD;  Location: [facility] CATH LAB;  Service: Cardiovascular;  Laterality: N/A;   PERCUTANEOUS CORONARY STENT INTERVENTION (PCI-S)  11/16/2012   Procedure: PERCUTANEOUS CORONARY STENT INTERVENTION (PCI-S);  Surgeon: Sherren Mocha, MD;  Location: [facility] CATH LAB;  Service: Cardiovascular;;   POLYPECTOMY  07/24/2020   Procedure: POLYPECTOMY;  Surgeon: Daneil Dolin, MD;  Location: [facility];  Service: Endoscopy;;   TOTAL KNEE ARTHROPLASTY Right 11/15/2013   Procedure: RIGHT TOTAL KNEE ARTHROPLASTY;  Surgeon: Mauri Pole, MD;  Location: [facility] ORS;  Service: Orthopedics;  Laterality: Right;   TOTAL KNEE ARTHROPLASTY Left 03/07/2015   Procedure: LEFT TOTAL KNEE ARTHROPLASTY;  Surgeon: Paralee Cancel, MD;  Location: [facility] ORS;  Service: Orthopedics;  Laterality: Left;    MEDICATIONS:  acarbose (PRECOSE) 50 MG tablet   acetaminophen (TYLENOL) 650 MG CR tablet   allopurinol (ZYLOPRIM) 100 MG tablet   amLODipine (NORVASC) 10 MG tablet   aspirin EC 81 MG tablet   calcitRIOL (ROCALTROL) 0.25 MCG capsule   carboxymethylcellulose (REFRESH PLUS) 0.5 % SOLN   carvedilol (COREG) 6.25 MG tablet   cholecalciferol (VITAMIN D) 1000 units tablet   cloNIDine (CATAPRES) 0.1 MG tablet   furosemide (LASIX) 40 MG tablet   gabapentin (NEURONTIN) 300 MG capsule   hydrALAZINE (APRESOLINE) 100 MG tablet   HYDROcodone-acetaminophen (NORCO) 10-325 MG tablet   insulin NPH Human (NOVOLIN N) 100 UNIT/ML injection    isosorbide mononitrate (IMDUR) 60 MG 24 hr tablet   lubiprostone (AMITIZA) 8 MCG capsule   meclizine (ANTIVERT) 25 MG tablet   nitroGLYCERIN (NITROSTAT) 0.4 MG SL tablet   ondansetron (ZOFRAN) 4 MG tablet   pantoprazole (PROTONIX) 40 MG tablet   pravastatin (PRAVACHOL) 40 MG tablet   TRUE METRIX BLOOD GLUCOSE TEST test strip   No current facility-administered medications for this encounter.    Myra Gianotti, PA-C Surgical Short Stay/Anesthesiology [facility] Phone [phone] [facility] Phone [phone] 06/03/2022 4:18 PM

## 2022-06-03 NOTE — Pre-Procedure Instructions (Signed)
Surgical Instructions    Your procedure is scheduled on Monday, June 19th.  Report to [facility] Main Entrance "A" at 8:30 A.M., then check in with the Admitting office.  Call this number if you have problems the morning of surgery:  [phone]   If you have any questions prior to your surgery date call [phone]: Open Monday-Friday 8am-4pm    Remember:  Do not eat after midnight the night before your surgery  You may drink clear liquids until 7:30 a.m. the morning of your surgery.   Clear liquids allowed are: Water, Non-Citrus Juices (without pulp), Carbonated Beverages, Clear Tea, Black Coffee Only (NO MILK, CREAM OR POWDERED CREAMER of any kind), and Gatorade.    Take these medicines the morning of surgery with A SIP OF WATER  allopurinol (ZYLOPRIM) carvedilol (COREG)  cloNIDine (CATAPRES) gabapentin (NEURONTIN)  hydrALAZINE (APRESOLINE)  isosorbide mononitrate (IMDUR)  pantoprazole (PROTONIX)   As needed: acetaminophen (TYLENOL)  Eye drops HYDROcodone-acetaminophen (NORCO)  meclizine (ANTIVERT)  nitroGLYCERIN (NITROSTAT)- please let a nurse know if you ha to use this. ondansetron [facility])   Follow your surgeon's instructions on when to stop Aspirin.  If no instructions were given by your surgeon then you will need to call the office to get those instructions.    As of today, STOP taking any Aleve, Naproxen, Ibuprofen, Motrin, Advil, Goody's, BC's, all herbal medications, fish oil, and all vitamins.  WHAT DO I DO ABOUT MY DIABETES MEDICATION?   Do not take acarbose (PRECOSE) the morning of surgery.  If your CBG is greater than 220 mg/dL, you may take  of your sliding scale (correction) dose of insulin NPH Human (NOVOLIN N)  insulin.   HOW TO MANAGE YOUR DIABETES BEFORE AND AFTER SURGERY  Why is it important to control my blood sugar before and after surgery? Improving blood sugar levels before and after surgery helps healing and can limit problems. A way of  improving blood sugar control is eating a healthy diet by:  Eating less sugar and carbohydrates  Increasing activity/exercise  Talking with your doctor about reaching your blood sugar goals High blood sugars (greater than 180 mg/dL) can raise your risk of infections and slow your recovery, so you will need to focus on controlling your diabetes during the weeks before surgery. Make sure that the doctor who takes care of your diabetes knows about your planned surgery including the date and location.  How do I manage my blood sugar before surgery? Check your blood sugar at least 4 times a day, starting 2 days before surgery, to make sure that the level is not too high or low.  Check your blood sugar the morning of your surgery when you wake up and every 2 hours until you get to the Short Stay unit.  If your blood sugar is less than 70 mg/dL, you will need to treat for low blood sugar: Do not take insulin. Treat a low blood sugar (less than 70 mg/dL) with  cup of clear juice (cranberry or apple), 4 glucose tablets, OR glucose gel. Recheck blood sugar in 15 minutes after treatment (to make sure it is greater than 70 mg/dL). If your blood sugar is not greater than 70 mg/dL on recheck, call [phone] for further instructions. Report your blood sugar to the short stay nurse when you get to Short Stay.  If you are admitted to the hospital after surgery: Your blood sugar will be checked by the staff and you will probably be given insulin after  surgery (instead of oral diabetes medicines) to make sure you have good blood sugar levels. The goal for blood sugar control after surgery is 80-180 mg/dL.                      Do NOT Smoke (Tobacco/Vaping) for 24 hours prior to your procedure.  If you use a CPAP at night, you may bring your mask/headgear for your overnight stay.   Contacts, glasses, piercing's, hearing aid's, dentures or partials may not be worn into surgery, please bring cases for these  belongings.    For patients admitted to the hospital, discharge time will be determined by your treatment team.   Patients discharged the day of surgery will not be allowed to drive home, and someone needs to stay with them for 24 hours.  SURGICAL WAITING ROOM VISITATION Patients having surgery or a procedure may have two support people in the waiting room. These visitors may be switched out with other visitors if needed. Children under the age of 60 must have an adult accompany them who is not the patient. If the patient needs to stay at the hospital during part of their recovery, the visitor guidelines for inpatient rooms apply.  Please refer to the [facility] website for the visitor guidelines for Inpatients (after your surgery is over and you are in a regular room).    Special instructions:   [place]- Preparing For Surgery  Before surgery, you can play an important role. Because skin is not sterile, your skin needs to be as free of germs as possible. You can reduce the number of germs on your skin by washing with CHG (chlorahexidine gluconate) Soap before surgery.  CHG is an antiseptic cleaner which kills germs and bonds with the skin to continue killing germs even after washing.    Oral Hygiene is also important to reduce your risk of infection.  Remember - BRUSH YOUR TEETH THE MORNING OF SURGERY WITH YOUR REGULAR TOOTHPASTE  Please do not use if you have an allergy to CHG or antibacterial soaps. If your skin becomes reddened/irritated stop using the CHG.  Do not shave (including legs and underarms) for at least 48 hours prior to first CHG shower. It is OK to shave your face.  Please follow these instructions carefully.   Shower the NIGHT BEFORE SURGERY and the MORNING OF SURGERY  If you chose to wash your hair, wash your hair first as usual with your normal shampoo.  After you shampoo, rinse your hair and body thoroughly to remove the shampoo.  Use CHG Soap as you would any  other liquid soap. You can apply CHG directly to the skin and wash gently with a scrungie or a clean washcloth.   Apply the CHG Soap to your body ONLY FROM THE NECK DOWN.  Do not use on open wounds or open sores. Avoid contact with your eyes, ears, mouth and genitals (private parts). Wash Face and genitals (private parts)  with your normal soap.   Wash thoroughly, paying special attention to the area where your surgery will be performed.  Thoroughly rinse your body with warm water from the neck down.  DO NOT shower/wash with your normal soap after using and rinsing off the CHG Soap.  Pat yourself dry with a CLEAN TOWEL.  Wear CLEAN PAJAMAS to bed the night before surgery  Place CLEAN SHEETS on your bed the night before your surgery  DO NOT SLEEP WITH PETS.   Day of Surgery:  Take a shower with CHG soap. Do not wear jewelry. Do not wear lotions, powders, colognes, or deodorant. Men may shave face and neck. Do not bring valuables to the hospital.  [facility] is not responsible for any belongings or valuables. Wear Clean/Comfortable clothing the morning of surgery Remember to brush your teeth WITH YOUR REGULAR TOOTHPASTE.   Please read over the following fact sheets that you were given.    If you received a COVID test during your pre-op visit  it is requested that you wear a mask when out in public, stay away from anyone that may not be feeling well and notify your surgeon if you develop symptoms. If you have been in contact with anyone that has tested positive in the last 10 days please notify you surgeon.

## 2022-06-04 ENCOUNTER — Encounter (HOSPITAL_COMMUNITY)
Admission: RE | Admit: 2022-06-04 | Discharge: 2022-06-04 | Disposition: A | Discharge status: Home / Self Care | Payer: Medicare Other | Source: Ambulatory Visit | Attending: Neurosurgery | Admitting: Neurosurgery

## 2022-06-04 ENCOUNTER — Encounter (HOSPITAL_COMMUNITY): Payer: Self-pay

## 2022-06-04 ENCOUNTER — Other Ambulatory Visit: Payer: Self-pay

## 2022-06-04 VITALS — BP 148/49 | HR 52 | Temp 97.9°F | Resp 17 | Ht 74.5 in | Wt 268.5 lb

## 2022-06-04 DIAGNOSIS — Z955 Presence of coronary angioplasty implant and graft: Secondary | ICD-10-CM | POA: Insufficient documentation

## 2022-06-04 DIAGNOSIS — Z87891 Personal history of nicotine dependence: Secondary | ICD-10-CM | POA: Diagnosis not present

## 2022-06-04 DIAGNOSIS — E119 Type 2 diabetes mellitus without complications: Secondary | ICD-10-CM

## 2022-06-04 DIAGNOSIS — N183 Chronic kidney disease, stage 3 unspecified: Secondary | ICD-10-CM | POA: Diagnosis not present

## 2022-06-04 DIAGNOSIS — I252 Old myocardial infarction: Secondary | ICD-10-CM | POA: Diagnosis not present

## 2022-06-04 DIAGNOSIS — K219 Gastro-esophageal reflux disease without esophagitis: Secondary | ICD-10-CM | POA: Diagnosis not present

## 2022-06-04 DIAGNOSIS — M961 Postlaminectomy syndrome, not elsewhere classified: Secondary | ICD-10-CM | POA: Insufficient documentation

## 2022-06-04 DIAGNOSIS — Z01812 Encounter for preprocedural laboratory examination: Secondary | ICD-10-CM | POA: Insufficient documentation

## 2022-06-04 DIAGNOSIS — I2510 Atherosclerotic heart disease of native coronary artery without angina pectoris: Secondary | ICD-10-CM | POA: Diagnosis not present

## 2022-06-04 DIAGNOSIS — Z01818 Encounter for other preprocedural examination: Secondary | ICD-10-CM

## 2022-06-04 DIAGNOSIS — I130 Hypertensive heart and chronic kidney disease with heart failure and stage 1 through stage 4 chronic kidney disease, or unspecified chronic kidney disease: Secondary | ICD-10-CM | POA: Diagnosis not present

## 2022-06-04 DIAGNOSIS — E1122 Type 2 diabetes mellitus with diabetic chronic kidney disease: Secondary | ICD-10-CM | POA: Diagnosis not present

## 2022-06-04 DIAGNOSIS — I5032 Chronic diastolic (congestive) heart failure: Secondary | ICD-10-CM | POA: Insufficient documentation

## 2022-06-04 HISTORY — DX: Polyneuropathy, unspecified: G62.9

## 2022-06-04 HISTORY — DX: Angina pectoris, unspecified: I20.9

## 2022-06-04 HISTORY — DX: Dizziness and giddiness: R42

## 2022-06-04 LAB — CBC
HCT: 29.1 % — ABNORMAL LOW (ref 39.0–52.0)
Hemoglobin: 9.5 g/dL — ABNORMAL LOW (ref 13.0–17.0)
MCH: 33.1 pg (ref 26.0–34.0)
MCHC: 32.6 g/dL (ref 30.0–36.0)
MCV: 101.4 fL — ABNORMAL HIGH (ref 80.0–100.0)
Platelets: 203 10*3/uL (ref 150–400)
RBC: 2.87 MIL/uL — ABNORMAL LOW (ref 4.22–5.81)
RDW: 15.2 % (ref 11.5–15.5)
WBC: 5.7 10*3/uL (ref 4.0–10.5)
nRBC: 0 % (ref 0.0–0.2)

## 2022-06-04 LAB — SURGICAL PCR SCREEN
MRSA, PCR: NEGATIVE
Staphylococcus aureus: NEGATIVE

## 2022-06-04 LAB — HEMOGLOBIN A1C
Hgb A1c MFr Bld: 6.1 % — ABNORMAL HIGH (ref 4.8–5.6)
Mean Plasma Glucose: 128.37 mg/dL

## 2022-06-04 LAB — BASIC METABOLIC PANEL
Anion gap: 11 (ref 5–15)
BUN: 69 mg/dL — ABNORMAL HIGH (ref 8–23)
CO2: 25 mmol/L (ref 22–32)
Calcium: 9 mg/dL (ref 8.9–10.3)
Chloride: 103 mmol/L (ref 98–111)
Creatinine, Ser: 3 mg/dL — ABNORMAL HIGH (ref 0.61–1.24)
GFR, Estimated: 21 mL/min — ABNORMAL LOW (ref >60)
Glucose, Bld: 149 mg/dL — ABNORMAL HIGH (ref 70–99)
Potassium: 3.5 mmol/L (ref 3.5–5.1)
Sodium: 139 mmol/L (ref 135–145)

## 2022-06-04 LAB — GLUCOSE, CAPILLARY: Glucose-Capillary: 174 mg/dL — ABNORMAL HIGH (ref 70–99)

## 2022-06-04 NOTE — Progress Notes (Signed)
PCP - Dr. Redmond School Cardiologist - Dr. Isaias Cowman  PPM/ICD - denies Device Orders - n/a Rep Notified - n/a  Chest x-ray - 05-29-2022 EKG - 05-30-2022 Stress Test - 05-06-2022 ECHO - 02-07-2022 Cardiac Cath - 11/16/2012 with DES placed  Sleep Study - denies CPAP - n/a  CBG at PAT visit 174. Pt checks CBG every morning. Normal range for patient 110-130s. He takes his NPH if CBG>135-140 Checks Blood Sugar __1__ times a day  Blood Thinner Instructions: n/a Aspirin Instructions: Pt was told by PCP to stop taking aspirin 7 days prior to surgery  ERAS Protcol - Yes. Clear liquids until 0730 morning of surgery. PRE-SURGERY Ensure or G2- n/a. None ordered  COVID TEST- n/a   Anesthesia review: Yes. Patient discussed with Myra Gianotti, PA-C prior to and during PAT visit. Pts creatinine has been trending up and hemoglobin has been trending down on recent lab work. Repeat CBC and BMP drawn today.  Patient denies shortness of breath, fever, cough and chest pain at PAT appointment   All instructions explained to the patient, with a verbal understanding of the material. Patient agrees to go over the instructions while at home for a better understanding. Patient also instructed to self quarantine after being tested for COVID-19. The opportunity to ask questions was provided.

## 2022-06-05 DIAGNOSIS — M109 Gout, unspecified: Secondary | ICD-10-CM | POA: Diagnosis not present

## 2022-06-05 NOTE — Anesthesia Preprocedure Evaluation (Addendum)
Anesthesia Evaluation  Patient identified by MRN, date of birth, ID band Patient awake    Reviewed: Allergy & Precautions, NPO status , Patient's Chart, lab work & pertinent test results  Airway Mallampati: II  TM Distance: >3 FB Neck ROM: Full    Dental  (+) Edentulous Upper   Pulmonary sleep apnea , former smoker,    Pulmonary exam normal breath sounds clear to auscultation       Cardiovascular hypertension, Pt. on medications + angina with exertion + CAD, + Past MI, + Cardiac Stents and +CHF  Normal cardiovascular exam Rhythm:Regular Rate:Normal     Neuro/Psych Anxiety Depression negative neurological ROS  negative psych ROS   GI/Hepatic Neg liver ROS, GERD  ,  Endo/Other  negative endocrine ROSdiabetes  Renal/GU Renal InsufficiencyRenal disease  negative genitourinary   Musculoskeletal  (+) Arthritis , Osteoarthritis,    Abdominal (+) + obese,   Peds negative pediatric ROS (+)  Hematology  (+) Blood dyscrasia, anemia ,   Anesthesia Other Findings   Reproductive/Obstetrics negative OB ROS                           Anesthesia Physical Anesthesia Plan  ASA: 3  Anesthesia Plan: General   Post-op Pain Management: Minimal or no pain anticipated and Dilaudid IV   Induction: Intravenous  PONV Risk Score and Plan: 2 and Ondansetron, Midazolam and Treatment may vary due to age or medical condition  Airway Management Planned: Oral ETT  Additional Equipment:   Intra-op Plan:   Post-operative Plan: Extubation in OR  Informed Consent: I have reviewed the patients History and Physical, chart, labs and discussed the procedure including the risks, benefits and alternatives for the proposed anesthesia with the patient or authorized representative who has indicated his/her understanding and acceptance.     Dental advisory given  Plan Discussed with: CRNA  Anesthesia Plan Comments:  (See PAT note written 06/05/2022 by Myra Gianotti, PA-C. Updated cardiology clearance from Dr. Harl Bowie received on 06/05/22. Known anemia of chronic/renal disease. HGB 9.5. Creatinine 3.0 (sees nephrology with known baseline ~ 2.6-3.0).   )       Anesthesia Quick Evaluation

## 2022-06-10 ENCOUNTER — Ambulatory Visit (HOSPITAL_COMMUNITY)
Admission: RE | Admit: 2022-06-10 | Discharge: 2022-06-12 | Disposition: A | Discharge status: Home / Self Care | Payer: Medicare Other | Source: Ambulatory Visit | Attending: Neurosurgery | Admitting: Neurosurgery

## 2022-06-10 ENCOUNTER — Other Ambulatory Visit: Payer: Self-pay

## 2022-06-10 ENCOUNTER — Ambulatory Visit (HOSPITAL_COMMUNITY): Payer: Medicare Other | Admitting: Vascular Surgery

## 2022-06-10 ENCOUNTER — Encounter (HOSPITAL_COMMUNITY)
Admission: RE | Disposition: A | Discharge status: Home / Self Care | Payer: Self-pay | Source: Ambulatory Visit | Attending: Neurosurgery

## 2022-06-10 ENCOUNTER — Ambulatory Visit (HOSPITAL_BASED_OUTPATIENT_CLINIC_OR_DEPARTMENT_OTHER): Payer: Medicare Other | Admitting: Certified Registered"

## 2022-06-10 ENCOUNTER — Ambulatory Visit (HOSPITAL_COMMUNITY): Payer: Medicare Other

## 2022-06-10 ENCOUNTER — Encounter (HOSPITAL_COMMUNITY): Payer: Self-pay | Admitting: Neurosurgery

## 2022-06-10 DIAGNOSIS — F419 Anxiety disorder, unspecified: Secondary | ICD-10-CM | POA: Insufficient documentation

## 2022-06-10 DIAGNOSIS — I130 Hypertensive heart and chronic kidney disease with heart failure and stage 1 through stage 4 chronic kidney disease, or unspecified chronic kidney disease: Secondary | ICD-10-CM | POA: Diagnosis not present

## 2022-06-10 DIAGNOSIS — Z87891 Personal history of nicotine dependence: Secondary | ICD-10-CM

## 2022-06-10 DIAGNOSIS — M961 Postlaminectomy syndrome, not elsewhere classified: Secondary | ICD-10-CM | POA: Diagnosis not present

## 2022-06-10 DIAGNOSIS — I110 Hypertensive heart disease with heart failure: Secondary | ICD-10-CM

## 2022-06-10 DIAGNOSIS — K219 Gastro-esophageal reflux disease without esophagitis: Secondary | ICD-10-CM | POA: Diagnosis not present

## 2022-06-10 DIAGNOSIS — M549 Dorsalgia, unspecified: Secondary | ICD-10-CM | POA: Insufficient documentation

## 2022-06-10 DIAGNOSIS — G4730 Sleep apnea, unspecified: Secondary | ICD-10-CM | POA: Diagnosis not present

## 2022-06-10 DIAGNOSIS — M1990 Unspecified osteoarthritis, unspecified site: Secondary | ICD-10-CM | POA: Insufficient documentation

## 2022-06-10 DIAGNOSIS — I5030 Unspecified diastolic (congestive) heart failure: Secondary | ICD-10-CM | POA: Diagnosis not present

## 2022-06-10 DIAGNOSIS — N183 Chronic kidney disease, stage 3 unspecified: Secondary | ICD-10-CM | POA: Insufficient documentation

## 2022-06-10 DIAGNOSIS — I25119 Atherosclerotic heart disease of native coronary artery with unspecified angina pectoris: Secondary | ICD-10-CM

## 2022-06-10 DIAGNOSIS — I252 Old myocardial infarction: Secondary | ICD-10-CM | POA: Insufficient documentation

## 2022-06-10 DIAGNOSIS — E1122 Type 2 diabetes mellitus with diabetic chronic kidney disease: Secondary | ICD-10-CM | POA: Insufficient documentation

## 2022-06-10 DIAGNOSIS — G8929 Other chronic pain: Secondary | ICD-10-CM | POA: Insufficient documentation

## 2022-06-10 DIAGNOSIS — I509 Heart failure, unspecified: Secondary | ICD-10-CM | POA: Diagnosis not present

## 2022-06-10 DIAGNOSIS — M546 Pain in thoracic spine: Secondary | ICD-10-CM | POA: Diagnosis not present

## 2022-06-10 DIAGNOSIS — E669 Obesity, unspecified: Secondary | ICD-10-CM | POA: Insufficient documentation

## 2022-06-10 DIAGNOSIS — F32A Depression, unspecified: Secondary | ICD-10-CM | POA: Insufficient documentation

## 2022-06-10 DIAGNOSIS — D631 Anemia in chronic kidney disease: Secondary | ICD-10-CM | POA: Diagnosis not present

## 2022-06-10 DIAGNOSIS — Z955 Presence of coronary angioplasty implant and graft: Secondary | ICD-10-CM | POA: Diagnosis not present

## 2022-06-10 DIAGNOSIS — I5032 Chronic diastolic (congestive) heart failure: Secondary | ICD-10-CM

## 2022-06-10 DIAGNOSIS — Z6833 Body mass index (BMI) 33.0-33.9, adult: Secondary | ICD-10-CM | POA: Diagnosis not present

## 2022-06-10 DIAGNOSIS — E119 Type 2 diabetes mellitus without complications: Secondary | ICD-10-CM

## 2022-06-10 DIAGNOSIS — E1140 Type 2 diabetes mellitus with diabetic neuropathy, unspecified: Secondary | ICD-10-CM | POA: Diagnosis not present

## 2022-06-10 HISTORY — PX: SPINAL CORD STIMULATOR INSERTION: SHX5378

## 2022-06-10 LAB — GLUCOSE, CAPILLARY
Glucose-Capillary: 150 mg/dL — ABNORMAL HIGH (ref 70–99)
Glucose-Capillary: 155 mg/dL — ABNORMAL HIGH (ref 70–99)
Glucose-Capillary: 153 mg/dL — ABNORMAL HIGH (ref 70–99)
Glucose-Capillary: 265 mg/dL — ABNORMAL HIGH (ref 70–99)
Glucose-Capillary: 297 mg/dL — ABNORMAL HIGH (ref 70–99)

## 2022-06-10 SURGERY — INSERTION, SPINAL CORD STIMULATOR, LUMBAR
Anesthesia: General

## 2022-06-10 MED ORDER — BUPIVACAINE-EPINEPHRINE (PF) 0.5% -1:200000 IJ SOLN
INTRAMUSCULAR | Status: DC | PRN
  Administered 2022-06-10 (×2): 10 mL

## 2022-06-10 MED ORDER — ISOSORBIDE MONONITRATE ER 60 MG PO TB24
60.0000 mg | ORAL_TABLET | Freq: Every day | ORAL | Status: DC
  Administered 2022-06-11 – 2022-06-12 (×2): 60 mg via ORAL
  Filled 2022-06-10 (×2): qty 1

## 2022-06-10 MED ORDER — HYDRALAZINE HCL 50 MG PO TABS
200.0000 mg | ORAL_TABLET | Freq: Two times a day (BID) | ORAL | Status: DC
  Administered 2022-06-11 – 2022-06-12 (×3): 200 mg via ORAL
  Filled 2022-06-10 (×4): qty 4

## 2022-06-10 MED ORDER — LACTATED RINGERS IV SOLN: INTRAVENOUS | Status: DC

## 2022-06-10 MED ORDER — POLYVINYL ALCOHOL 1.4 % OP SOLN: 1.0000 [drp] | Freq: Every day | OPHTHALMIC | Status: DC | PRN

## 2022-06-10 MED ORDER — LIDOCAINE 2% (20 MG/ML) 5 ML SYRINGE
INTRAMUSCULAR | Status: AC
  Filled 2022-06-10: qty 5

## 2022-06-10 MED ORDER — ROCURONIUM BROMIDE 10 MG/ML (PF) SYRINGE
PREFILLED_SYRINGE | INTRAVENOUS | Status: AC
  Filled 2022-06-10: qty 10

## 2022-06-10 MED ORDER — INSULIN ASPART 100 UNIT/ML IJ SOLN
0.0000 [IU] | INTRAMUSCULAR | Status: DC
  Administered 2022-06-10 (×2): 11 [IU] via SUBCUTANEOUS
  Administered 2022-06-11: 4 [IU] via SUBCUTANEOUS
  Administered 2022-06-11: 3 [IU] via SUBCUTANEOUS
  Administered 2022-06-11: 4 [IU] via SUBCUTANEOUS
  Administered 2022-06-11 (×2): 3 [IU] via SUBCUTANEOUS
  Administered 2022-06-11: 4 [IU] via SUBCUTANEOUS
  Administered 2022-06-11: 7 [IU] via SUBCUTANEOUS
  Administered 2022-06-12: 3 [IU] via SUBCUTANEOUS
  Administered 2022-06-12: 4 [IU] via SUBCUTANEOUS

## 2022-06-10 MED ORDER — OXYCODONE HCL 5 MG PO TABS: 5.0000 mg | ORAL_TABLET | Freq: Once | ORAL | Status: DC | PRN

## 2022-06-10 MED ORDER — CHLORHEXIDINE GLUCONATE CLOTH 2 % EX PADS: 6.0000 | MEDICATED_PAD | Freq: Once | CUTANEOUS | Status: DC

## 2022-06-10 MED ORDER — ORAL CARE MOUTH RINSE: 15.0000 mL | Freq: Once | OROMUCOSAL | Status: AC

## 2022-06-10 MED ORDER — ONDANSETRON HCL 4 MG/2ML IJ SOLN
INTRAMUSCULAR | Status: AC
  Filled 2022-06-10: qty 2

## 2022-06-10 MED ORDER — CEFAZOLIN IN SODIUM CHLORIDE 3-0.9 GM/100ML-% IV SOLN
3.0000 g | INTRAVENOUS | Status: AC
  Administered 2022-06-10: 3 g via INTRAVENOUS
  Filled 2022-06-10: qty 100

## 2022-06-10 MED ORDER — ACARBOSE 25 MG PO TABS
50.0000 mg | ORAL_TABLET | Freq: Three times a day (TID) | ORAL | Status: DC
  Administered 2022-06-11 – 2022-06-12 (×4): 50 mg via ORAL
  Filled 2022-06-10 (×5): qty 2

## 2022-06-10 MED ORDER — THROMBIN 5000 UNITS EX SOLR
OROMUCOSAL | Status: DC | PRN
  Administered 2022-06-10: 5 mL via TOPICAL

## 2022-06-10 MED ORDER — OXYCODONE HCL 5 MG PO TABS: 10.0000 mg | ORAL_TABLET | ORAL | Status: DC | PRN

## 2022-06-10 MED ORDER — HYDROCODONE-ACETAMINOPHEN 10-325 MG PO TABS
1.0000 | ORAL_TABLET | ORAL | Status: DC | PRN
  Administered 2022-06-11 – 2022-06-12 (×2): 1 via ORAL
  Filled 2022-06-10 (×3): qty 1

## 2022-06-10 MED ORDER — PROPOFOL 10 MG/ML IV BOLUS
INTRAVENOUS | Status: DC | PRN
  Administered 2022-06-10: 150 mg via INTRAVENOUS

## 2022-06-10 MED ORDER — CLONIDINE HCL 0.1 MG PO TABS
0.1000 mg | ORAL_TABLET | Freq: Three times a day (TID) | ORAL | Status: DC
  Administered 2022-06-10 – 2022-06-12 (×5): 0.1 mg via ORAL
  Filled 2022-06-10 (×6): qty 1

## 2022-06-10 MED ORDER — THROMBIN 5000 UNITS EX SOLR
CUTANEOUS | Status: AC
  Filled 2022-06-10: qty 5000

## 2022-06-10 MED ORDER — CYCLOBENZAPRINE HCL 10 MG PO TABS
10.0000 mg | ORAL_TABLET | Freq: Three times a day (TID) | ORAL | Status: DC | PRN
  Filled 2022-06-10: qty 1

## 2022-06-10 MED ORDER — ONDANSETRON HCL 4 MG PO TABS
4.0000 mg | ORAL_TABLET | Freq: Four times a day (QID) | ORAL | Status: DC | PRN
  Administered 2022-06-11: 4 mg via ORAL
  Filled 2022-06-10: qty 1

## 2022-06-10 MED ORDER — EPHEDRINE SULFATE-NACL 50-0.9 MG/10ML-% IV SOSY
PREFILLED_SYRINGE | INTRAVENOUS | Status: DC | PRN
  Administered 2022-06-10: 5 mg via INTRAVENOUS

## 2022-06-10 MED ORDER — SODIUM CHLORIDE 0.9 % IV SOLN: 250.0000 mL | INTRAVENOUS | Status: DC

## 2022-06-10 MED ORDER — LUBIPROSTONE 8 MCG PO CAPS
8.0000 ug | ORAL_CAPSULE | Freq: Every day | ORAL | Status: DC
Start: 2022-06-11 — End: 2022-06-12
  Administered 2022-06-11 – 2022-06-12 (×2): 8 ug via ORAL
  Filled 2022-06-10 (×2): qty 1

## 2022-06-10 MED ORDER — MECLIZINE HCL 25 MG PO TABS: 25.0000 mg | ORAL_TABLET | Freq: Three times a day (TID) | ORAL | Status: DC | PRN

## 2022-06-10 MED ORDER — PHENOL 1.4 % MT LIQD: 1.0000 | OROMUCOSAL | Status: DC | PRN

## 2022-06-10 MED ORDER — HYDROMORPHONE HCL 1 MG/ML IJ SOLN
INTRAMUSCULAR | Status: AC
  Filled 2022-06-10: qty 1

## 2022-06-10 MED ORDER — CHLORHEXIDINE GLUCONATE 0.12 % MT SOLN
15.0000 mL | Freq: Once | OROMUCOSAL | Status: AC
  Administered 2022-06-10: 15 mL via OROMUCOSAL
  Filled 2022-06-10: qty 15

## 2022-06-10 MED ORDER — ONDANSETRON HCL 4 MG/2ML IJ SOLN
4.0000 mg | Freq: Four times a day (QID) | INTRAMUSCULAR | Status: DC | PRN
  Administered 2022-06-11 – 2022-06-12 (×2): 4 mg via INTRAVENOUS
  Filled 2022-06-10 (×2): qty 2

## 2022-06-10 MED ORDER — PROPOFOL 10 MG/ML IV BOLUS
INTRAVENOUS | Status: AC
  Filled 2022-06-10: qty 20

## 2022-06-10 MED ORDER — VANCOMYCIN HCL 1000 MG IV SOLR
INTRAVENOUS | Status: AC
  Filled 2022-06-10: qty 20

## 2022-06-10 MED ORDER — ONDANSETRON HCL 4 MG PO TABS: 4.0000 mg | ORAL_TABLET | Freq: Three times a day (TID) | ORAL | Status: DC | PRN

## 2022-06-10 MED ORDER — ALLOPURINOL 100 MG PO TABS
100.0000 mg | ORAL_TABLET | Freq: Every day | ORAL | Status: DC
  Administered 2022-06-11 – 2022-06-12 (×2): 100 mg via ORAL
  Filled 2022-06-10 (×2): qty 1

## 2022-06-10 MED ORDER — FENTANYL CITRATE (PF) 250 MCG/5ML IJ SOLN
INTRAMUSCULAR | Status: AC
  Filled 2022-06-10: qty 5

## 2022-06-10 MED ORDER — CARVEDILOL 6.25 MG PO TABS
6.2500 mg | ORAL_TABLET | Freq: Two times a day (BID) | ORAL | Status: DC
  Administered 2022-06-10 – 2022-06-12 (×3): 6.25 mg via ORAL
  Filled 2022-06-10 (×4): qty 1

## 2022-06-10 MED ORDER — SODIUM CHLORIDE 0.9% FLUSH
3.0000 mL | Freq: Two times a day (BID) | INTRAVENOUS | Status: DC
  Administered 2022-06-10 – 2022-06-12 (×3): 3 mL via INTRAVENOUS

## 2022-06-10 MED ORDER — FUROSEMIDE 40 MG PO TABS
40.0000 mg | ORAL_TABLET | Freq: Two times a day (BID) | ORAL | Status: DC
  Administered 2022-06-10 – 2022-06-12 (×4): 40 mg via ORAL
  Filled 2022-06-10 (×4): qty 1

## 2022-06-10 MED ORDER — SUGAMMADEX SODIUM 200 MG/2ML IV SOLN
INTRAVENOUS | Status: DC | PRN
  Administered 2022-06-10: 250 mg via INTRAVENOUS

## 2022-06-10 MED ORDER — PANTOPRAZOLE SODIUM 40 MG PO TBEC
40.0000 mg | DELAYED_RELEASE_TABLET | Freq: Two times a day (BID) | ORAL | Status: DC
  Administered 2022-06-10 – 2022-06-12 (×4): 40 mg via ORAL
  Filled 2022-06-10 (×4): qty 1

## 2022-06-10 MED ORDER — CEFAZOLIN SODIUM-DEXTROSE 2-4 GM/100ML-% IV SOLN
2.0000 g | Freq: Three times a day (TID) | INTRAVENOUS | Status: AC
  Administered 2022-06-10 – 2022-06-11 (×2): 2 g via INTRAVENOUS
  Filled 2022-06-10 (×2): qty 100

## 2022-06-10 MED ORDER — MORPHINE SULFATE (PF) 2 MG/ML IV SOLN: 4.0000 mg | INTRAVENOUS | Status: DC | PRN

## 2022-06-10 MED ORDER — BISACODYL 10 MG RE SUPP: 10.0000 mg | Freq: Every day | RECTAL | Status: DC | PRN

## 2022-06-10 MED ORDER — FENTANYL CITRATE (PF) 100 MCG/2ML IJ SOLN
INTRAMUSCULAR | Status: DC | PRN
  Administered 2022-06-10: 25 ug via INTRAVENOUS
  Administered 2022-06-10 (×2): 50 ug via INTRAVENOUS

## 2022-06-10 MED ORDER — HYDROMORPHONE HCL 1 MG/ML IJ SOLN
0.2500 mg | INTRAMUSCULAR | Status: DC | PRN
  Administered 2022-06-10 (×2): 0.25 mg via INTRAVENOUS

## 2022-06-10 MED ORDER — ACETAMINOPHEN 500 MG PO TABS
1000.0000 mg | ORAL_TABLET | Freq: Four times a day (QID) | ORAL | Status: AC
  Administered 2022-06-10 – 2022-06-11 (×4): 1000 mg via ORAL
  Filled 2022-06-10 (×4): qty 2

## 2022-06-10 MED ORDER — ONDANSETRON HCL 4 MG/2ML IJ SOLN
INTRAMUSCULAR | Status: DC | PRN
  Administered 2022-06-10: 4 mg via INTRAVENOUS

## 2022-06-10 MED ORDER — SODIUM CHLORIDE 0.9% FLUSH
3.0000 mL | INTRAVENOUS | Status: DC | PRN
Start: 2022-06-10 — End: 2022-06-12

## 2022-06-10 MED ORDER — AMISULPRIDE (ANTIEMETIC) 5 MG/2ML IV SOLN
10.0000 mg | Freq: Once | INTRAVENOUS | Status: DC | PRN
Start: 2022-06-10 — End: 2022-06-10

## 2022-06-10 MED ORDER — DOCUSATE SODIUM 100 MG PO CAPS
100.0000 mg | ORAL_CAPSULE | Freq: Two times a day (BID) | ORAL | Status: DC
Start: 2022-06-10 — End: 2022-06-12
  Administered 2022-06-10 – 2022-06-12 (×4): 100 mg via ORAL
  Filled 2022-06-10 (×4): qty 1

## 2022-06-10 MED ORDER — LIDOCAINE 2% (20 MG/ML) 5 ML SYRINGE
INTRAMUSCULAR | Status: DC | PRN
  Administered 2022-06-10: 100 mg via INTRAVENOUS

## 2022-06-10 MED ORDER — GABAPENTIN 300 MG PO CAPS
300.0000 mg | ORAL_CAPSULE | Freq: Three times a day (TID) | ORAL | Status: DC
  Administered 2022-06-10 – 2022-06-12 (×6): 300 mg via ORAL
  Filled 2022-06-10 (×6): qty 1

## 2022-06-10 MED ORDER — OXYCODONE HCL 5 MG PO TABS: 5.0000 mg | ORAL_TABLET | ORAL | Status: DC | PRN

## 2022-06-10 MED ORDER — PROMETHAZINE HCL 25 MG/ML IJ SOLN: 6.2500 mg | INTRAMUSCULAR | Status: DC | PRN

## 2022-06-10 MED ORDER — ACETAMINOPHEN 650 MG RE SUPP: 650.0000 mg | RECTAL | Status: DC | PRN

## 2022-06-10 MED ORDER — AMLODIPINE BESYLATE 10 MG PO TABS
10.0000 mg | ORAL_TABLET | Freq: Every evening | ORAL | Status: DC
  Administered 2022-06-11: 10 mg via ORAL
  Filled 2022-06-10: qty 1

## 2022-06-10 MED ORDER — PRAVASTATIN SODIUM 40 MG PO TABS
40.0000 mg | ORAL_TABLET | Freq: Every evening | ORAL | Status: DC
  Administered 2022-06-10 – 2022-06-11 (×2): 40 mg via ORAL
  Filled 2022-06-10 (×2): qty 1

## 2022-06-10 MED ORDER — SODIUM CHLORIDE 0.9 % IV SOLN: INTRAVENOUS | Status: DC

## 2022-06-10 MED ORDER — ROCURONIUM BROMIDE 100 MG/10ML IV SOLN
INTRAVENOUS | Status: DC | PRN
  Administered 2022-06-10: 100 mg via INTRAVENOUS

## 2022-06-10 MED ORDER — OXYCODONE HCL 5 MG/5ML PO SOLN: 5.0000 mg | Freq: Once | ORAL | Status: DC | PRN

## 2022-06-10 MED ORDER — ACETAMINOPHEN 325 MG PO TABS
650.0000 mg | ORAL_TABLET | ORAL | Status: DC | PRN
  Administered 2022-06-11: 650 mg via ORAL
  Filled 2022-06-10: qty 2

## 2022-06-10 MED ORDER — NITROGLYCERIN 0.4 MG SL SUBL
0.4000 mg | SUBLINGUAL_TABLET | SUBLINGUAL | Status: DC | PRN
Start: 2022-06-10 — End: 2022-06-12

## 2022-06-10 MED ORDER — MENTHOL 3 MG MT LOZG: 1.0000 | LOZENGE | OROMUCOSAL | Status: DC | PRN

## 2022-06-10 MED ORDER — BACITRACIN ZINC 500 UNIT/GM EX OINT
TOPICAL_OINTMENT | CUTANEOUS | Status: AC
  Filled 2022-06-10: qty 28.35

## 2022-06-10 MED ORDER — DEXAMETHASONE SODIUM PHOSPHATE 10 MG/ML IJ SOLN
INTRAMUSCULAR | Status: AC
  Filled 2022-06-10: qty 1

## 2022-06-10 MED ORDER — INSULIN ASPART 100 UNIT/ML IJ SOLN: 0.0000 [IU] | INTRAMUSCULAR | Status: DC | PRN

## 2022-06-10 MED ORDER — DEXAMETHASONE SODIUM PHOSPHATE 10 MG/ML IJ SOLN
INTRAMUSCULAR | Status: DC | PRN
  Administered 2022-06-10: 5 mg via INTRAVENOUS

## 2022-06-10 MED ORDER — BUPIVACAINE-EPINEPHRINE 0.5% -1:200000 IJ SOLN
INTRAMUSCULAR | Status: AC
Start: 2022-06-10 — End: ?
  Filled 2022-06-10: qty 1

## 2022-06-10 MED ORDER — 0.9 % SODIUM CHLORIDE (POUR BTL) OPTIME
TOPICAL | Status: DC | PRN
  Administered 2022-06-10: 1000 mL

## 2022-06-10 SURGICAL SUPPLY — 66 items
ADH SKN CLS APL DERMABOND .7 (GAUZE/BANDAGES/DRESSINGS)
APL SKNCLS STERI-STRIP NONHPOA (GAUZE/BANDAGES/DRESSINGS) ×1
BAG COUNTER SPONGE SURGICOUNT (BAG) ×2 IMPLANT
BAG SPNG CNTER NS LX DISP (BAG) ×1
BENZOIN TINCTURE PRP APPL 2/3 (GAUZE/BANDAGES/DRESSINGS) ×3 IMPLANT
BLADE CLIPPER SURG (BLADE) IMPLANT
BUR MATCHSTICK NEURO 3.0 LAGG (BURR) ×3 IMPLANT
BUR PRECISION FLUTE 6.0 (BURR) ×3 IMPLANT
CARTRIDGE OIL MAESTRO DRILL (MISCELLANEOUS) ×2 IMPLANT
CONTROL REMOTE FREELINK ALPHA (NEUROSURGERY SUPPLIES) ×1 IMPLANT
DERMABOND ADVANCED (GAUZE/BANDAGES/DRESSINGS)
DERMABOND ADVANCED .7 DNX12 (GAUZE/BANDAGES/DRESSINGS) IMPLANT
DEVICE DISSECT PLASMABLAD 3.0S (MISCELLANEOUS) IMPLANT
DIFFUSER DRILL AIR PNEUMATIC (MISCELLANEOUS) ×3 IMPLANT
DRAPE C-ARM 42X72 X-RAY (DRAPES) ×3 IMPLANT
DRAPE INCISE IOBAN 85X60 (DRAPES) IMPLANT
DRAPE LAPAROTOMY 100X72X124 (DRAPES) ×2 IMPLANT
DRAPE SURG 17X23 STRL (DRAPES) ×12 IMPLANT
ELECT BLADE 4.0 EZ CLEAN MEGAD (MISCELLANEOUS) ×2
ELECT REM PT RETURN 9FT ADLT (ELECTROSURGICAL) ×2
ELECTRODE BLDE 4.0 EZ CLN MEGD (MISCELLANEOUS) ×2 IMPLANT
ELECTRODE REM PT RTRN 9FT ADLT (ELECTROSURGICAL) ×2 IMPLANT
ELEVATER PASSER (SPINAL CORD STIMULATOR) ×2
GAUZE 4X4 16PLY ~~LOC~~+RFID DBL (SPONGE) IMPLANT
GLOVE BIO SURGEON STRL SZ8 (GLOVE) ×3 IMPLANT
GLOVE BIO SURGEON STRL SZ8.5 (GLOVE) ×2 IMPLANT
GLOVE BIOGEL PI IND STRL 7.0 (GLOVE) IMPLANT
GLOVE BIOGEL PI IND STRL 7.5 (GLOVE) IMPLANT
GLOVE BIOGEL PI INDICATOR 7.0 (GLOVE) ×2
GLOVE BIOGEL PI INDICATOR 7.5 (GLOVE) ×1
GLOVE ECLIPSE 6.5 STRL STRAW (GLOVE) ×2 IMPLANT
GLOVE EXAM NITRILE XL STR (GLOVE) IMPLANT
GLOVE SURG POLYISO LF SZ6 (GLOVE) ×3 IMPLANT
GLOVE SURG UNDER POLY LF SZ6.5 (GLOVE) ×3 IMPLANT
GOWN STRL REUS W/ TWL LRG LVL3 (GOWN DISPOSABLE) IMPLANT
GOWN STRL REUS W/ TWL XL LVL3 (GOWN DISPOSABLE) IMPLANT
GOWN STRL REUS W/TWL 2XL LVL3 (GOWN DISPOSABLE) IMPLANT
GOWN STRL REUS W/TWL LRG LVL3 (GOWN DISPOSABLE) ×6
GOWN STRL REUS W/TWL XL LVL3 (GOWN DISPOSABLE)
HEMOSTAT POWDER KIT SURGIFOAM (HEMOSTASIS) ×3 IMPLANT
KIT BASIN OR (CUSTOM PROCEDURE TRAY) ×3 IMPLANT
KIT CHARGING (KITS) ×1
KIT CHARGING PRECISION NEURO (KITS) IMPLANT
KIT IPG ALPHA WAVEWRITER (Stimulator) ×1 IMPLANT
KIT TURNOVER KIT B (KITS) ×2 IMPLANT
LEAD PADDLE COVEREDGE 70CM (Lead) ×1 IMPLANT
MARKER SKIN DUAL TIP RULER LAB (MISCELLANEOUS) ×1 IMPLANT
NEEDLE HYPO 22GX1.5 SAFETY (NEEDLE) ×3 IMPLANT
NS IRRIG 1000ML POUR BTL (IV SOLUTION) ×3 IMPLANT
OIL CARTRIDGE MAESTRO DRILL (MISCELLANEOUS) ×2
PACK LAMINECTOMY NEURO (CUSTOM PROCEDURE TRAY) ×3 IMPLANT
PAD ARMBOARD 7.5X6 YLW CONV (MISCELLANEOUS) ×3 IMPLANT
PASSER ELEVATOR (SPINAL CORD STIMULATOR) IMPLANT
PLASMABLADE 3.0S (MISCELLANEOUS)
SPONGE T-LAP 4X18 ~~LOC~~+RFID (SPONGE) IMPLANT
STAPLER SKIN PROX WIDE 3.9 (STAPLE) IMPLANT
STRIP CLOSURE SKIN 1/2X4 (GAUZE/BANDAGES/DRESSINGS) ×2 IMPLANT
SUT SILK 0 TIES 10X30 (SUTURE) IMPLANT
SUT SILK 2 0 PERMA HAND 18 BK (SUTURE) ×3 IMPLANT
SUT VIC AB 1 CT1 18XBRD ANBCTR (SUTURE) ×1 IMPLANT
SUT VIC AB 1 CT1 8-18 (SUTURE) ×2
SUT VIC AB 2-0 CP2 18 (SUTURE) ×3 IMPLANT
TOOL LONG TUNNEL (SPINAL CORD STIMULATOR) ×1 IMPLANT
TOWEL GREEN STERILE (TOWEL DISPOSABLE) ×3 IMPLANT
TOWEL GREEN STERILE FF (TOWEL DISPOSABLE) ×2 IMPLANT
WATER STERILE IRR 1000ML POUR (IV SOLUTION) ×3 IMPLANT

## 2022-06-10 NOTE — Transfer of Care (Signed)
Immediate Anesthesia Transfer of Care Note  Patient: [name]  Procedure(s) Performed: Spinal cord stimulator placement  Patient Location: PACU  Anesthesia Type:General  Level of Consciousness: drowsy  Airway & Oxygen Therapy: Patient Spontanous Breathing and Patient connected to face mask oxygen  Post-op Assessment: Report given to RN and Post -op Vital signs reviewed and stable  Post vital signs: Reviewed and stable  Last Vitals:  Vitals Value Taken Time  BP 155/64 06/10/22 1301  Temp    Pulse 68 06/10/22 1302  Resp 17 06/10/22 1302  SpO2 97 % 06/10/22 1302  Vitals shown include unvalidated device data.  Last Pain:  Vitals:   06/10/22 0858  TempSrc:   PainSc: 10-Worst pain ever      Patients Stated Pain Goal: 3 (17/91/50 5697)  Complications: No notable events documented.

## 2022-06-10 NOTE — Anesthesia Postprocedure Evaluation (Signed)
Anesthesia Post Note  Patient: [name]  Procedure(s) Performed: Spinal cord stimulator placement     Patient location during evaluation: PACU Anesthesia Type: General Level of consciousness: awake and alert Pain management: pain level controlled Vital Signs Assessment: post-procedure vital signs reviewed and stable Respiratory status: spontaneous breathing, nonlabored ventilation and respiratory function stable Cardiovascular status: blood pressure returned to baseline and stable Postop Assessment: no apparent nausea or vomiting Anesthetic complications: no   No notable events documented.  Last Vitals:  Vitals:   06/10/22 1400 06/10/22 1415  BP: 139/60 (!) 144/62  Pulse: 69 69  Resp: 13 14  Temp:    SpO2: 90% 94%    Last Pain:  Vitals:   06/10/22 1400  TempSrc:   PainSc: Warm [place]

## 2022-06-10 NOTE — Anesthesia Procedure Notes (Signed)
Procedure Name: Intubation Date/Time: 06/10/2022 11:09 AM  Performed by: Gwyndolyn Saxon, CRNAPre-anesthesia Checklist: Patient identified, Emergency Drugs available, Suction available and Patient being monitored Patient Re-evaluated:Patient Re-evaluated prior to induction Oxygen Delivery Method: Circle system utilized Preoxygenation: Pre-oxygenation with 100% oxygen Induction Type: IV induction Ventilation: Mask ventilation without difficulty Laryngoscope Size: Miller and 2 Grade View: Grade II Tube type: Oral Tube size: 8.0 mm Number of attempts: 1 Airway Equipment and Method: Patient positioned with wedge pillow and Stylet Placement Confirmation: ETT inserted through vocal cords under direct vision, positive ETCO2 and breath sounds checked- equal and bilateral Secured at: 24 cm Tube secured with: Tape Dental Injury: Teeth and Oropharynx as per pre-operative assessment

## 2022-06-10 NOTE — H&P (Signed)
Subjective: The patient is a 76 year old black male whose had prior back surgery.  He is developed chronic back pain.  He had a spinal cord stimulator trial which was successful.  He has elected to have a permanent spinal cord stimulator placed.  Past Medical History:  Diagnosis Date   Anginal pain ([place])    Anxiety    Benign prostatic hypertrophy    Nocturia   CAD (coronary artery disease)    a.  NSTEMI 10/2012 s/p DES to LAD & DES to 1st diagonal with residual diffuse nonobstructive dz in LCx/RCA    Chronic kidney disease, stage 3, mod decreased GFR (HCC)    Creatinine of 1.5 in 03/2009, STAGE 4 now   Degenerative joint disease    Depression    hx of   Diabetes mellitus, type II ([place]) 68/34/1962   Diastolic heart failure    Pedal edema, "a long time ago"   Erectile dysfunction    Flu 2013   hx of   GERD (gastroesophageal reflux disease)    Gout    Hemorrhoids    High cholesterol    Hypertension    Exercise induced   Myocardial infarction ([place])    Neuropathy    bilateral legs   NSVT (nonsustained ventricular tachycardia) ([place])    Exercise induced   Obesity    Tobacco abuse, 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 Oneida Alar- TCS= left sided colitis and mild proctitis likely due to ischemic colitis, moderate internal hemorrhoids, small nodule in the rectum, bx=tubular adenoma, EGD=-mild non-erosive gastritis   COLONOSCOPY WITH PROPOFOL N/A 07/24/2020   hemorrhoids,  rectosigmoid lipoma, small adenoma removed.   CORONARY ANGIOPLASTY     ESOPHAGOGASTRODUODENOSCOPY (EGD) WITH PROPOFOL N/A 02/05/2021   Normal  esophagus, erythematous mucosa in entire stomach, no obvious ulcer, s/p biopsy. Normal duodenum. Negative H.pylori.    HERNIA REPAIR     JOINT REPLACEMENT     LEFT HEART CATHETERIZATION WITH CORONARY ANGIOGRAM N/A 11/16/2012   Procedure: LEFT HEART CATHETERIZATION WITH CORONARY ANGIOGRAM;  Surgeon: Sherren Mocha, MD;  [facility] C[facility] LAB;  Service: Cardiovascular;  Laterality: N/A;   PERCUTANEOUS CORONARY STENT INTERVENTION (PCI-S)  11/16/2012   Procedure: PERCUTANEOUS CORONARY STENT INTERVENTION (PCI-S);  Surgeon: Sherren Mocha, MD;  [facility] C[facility] LAB;  Service: Cardiovascular;;   POLYPECTOMY  07/24/2020   Procedure: POLYPECTOMY;  Surgeon: Daneil Dolin, MD;  Location: [facility];  Service: Endoscopy;;   THUMB AMPUTATION Left    50 years ago   TOTAL KNEE ARTHROPLASTY Right 11/15/2013   Procedure: RIGHT TOTAL KNEE ARTHROPLASTY;  Surgeon: Mauri Pole, MD;  Location: [facility] ORS;  Service: Orthopedics;  Laterality: Right;   TOTAL KNEE ARTHROPLASTY Left 03/07/2015   Procedure: LEFT TOTAL KNEE ARTHROPLASTY;  Surgeon: Paralee Cancel, MD;  Location: [facility] ORS;  Service: Orthopedics;  Laterality: Left;    Allergies  Allergen Reactions   Metformin Nausea And Vomiting   Niacin    Niacin And Related Other (See Comments)    Reaction: flushing and burning side effects even with  extended release    Social History   Tobacco Use   Smoking status: Former    Packs/day: 1.00    Years: 30.00    Total pack years: 30.00    Types: Cigarettes    Start date: 01/22/1965    Quit date: 12/23/1992    Years since quitting: 29.4   Smokeless tobacco: Never  Substance Use Topics   Alcohol use: No    Alcohol/week: 0.0 standard drinks of alcohol    Family History  Problem Relation Age of Onset   Hypertension Other    Cancer Other    Heart disease Other    Diabetes Other    Hypertension Mother    Cancer Father        brain   CAD Brother    Colon cancer Neg Hx    Prior to Admission medications    Medication Sig Start Date End Date Taking? Authorizing Provider  acarbose (PRECOSE) 50 MG tablet Take 50 mg by mouth in the morning, at noon, and at bedtime.  10/20/17  Yes [provider]  acetaminophen (TYLENOL) 650 MG CR tablet Take 1,300 mg by mouth every 8 (eight) hours as needed for pain.   Yes [provider]  allopurinol (ZYLOPRIM) 100 MG tablet Take 100 mg by mouth daily. 08/17/20  Yes [provider]  amLODipine (NORVASC) 10 MG tablet Take 10 mg by mouth every evening.    Yes [provider]  aspirin EC 81 MG tablet Take 1 tablet (81 mg total) by mouth daily. 09/14/19  Yes Herminio Commons, MD  calcitRIOL (ROCALTROL) 0.25 MCG capsule Take 0.25 mcg by mouth every Sunday.  12/13/16  Yes [provider]  carboxymethylcellulose (REFRESH PLUS) 0.5 % SOLN Place 1 drop into both eyes daily as needed (dry eyes).   Yes [provider]  carvedilol (COREG) 6.25 MG tablet Take 6.25 mg by mouth 2 (two) times daily with a meal. 04/18/22 04/18/23 Yes [provider]  cholecalciferol (VITAMIN D) 1000 units tablet Take 1,000 Units daily by mouth.   Yes [provider]  cloNIDine (CATAPRES) 0.1 MG tablet Take 0.1 mg by mouth 3 (three) times daily. 02/15/20  Yes [provider]  furosemide (LASIX) 40 MG tablet Take 1 tablet (40 mg total) by mouth daily. Patient taking differently: Take 40 mg by mouth 2 (two) times daily. 05/11/22  Yes Pokhrel, Laxman, MD  gabapentin (NEURONTIN) 300 MG capsule Take 300 mg by mouth 3 (three) times daily.  10/19/19  Yes [provider]  hydrALAZINE (APRESOLINE) 100 MG tablet Take 200 mg by mouth 2 (two) times daily. 01/24/22  Yes [provider]  HYDROcodone-acetaminophen (NORCO) 10-325 MG tablet Take 1-2 tablets by mouth 4 (four) times daily as needed for pain. 05/27/22  Yes [provider]  insulin NPH Human (NOVOLIN N) 100 UNIT/ML injection Inject 0.18 mLs (18 Units total)  into the skin every morning. Ands syringes 1/day Patient taking differently: Inject 20 Units into the skin daily as needed (if CBG is high). Per sliding scale: not provided 06/27/21  Yes Renato Shin, MD  isosorbide mononitrate (IMDUR) 60 MG 24 hr tablet Take 1 tablet (60 mg total) by mouth daily. 05/30/22  Yes Branch, Alphonse Guild, MD  meclizine (ANTIVERT) 25 MG tablet Take 25 mg by mouth 3 (three) times daily as needed for dizziness.   Yes [provider]  nitroGLYCERIN (NITROSTAT) 0.4 MG SL tablet Place 1 tablet (0.4 mg total) under the tongue every 5 (five)  minutes as needed for chest pain. PLACE ONE TABLET UNDER THE TONGUE EVERY 5 MINUTES FOR 3 DOSES AS NEEDED Strength: 0.4 mg 02/07/22  Yes Branch, Alphonse Guild, MD  ondansetron (ZOFRAN) 4 MG tablet TAKE ONE TABLET (4MG TOTAL) BY MOUTH EVERY 8 HOURS AS NEEDED FOR NAUSEA OR VOMITING Patient taking differently: Take 4 mg by mouth every 8 (eight) hours as needed for nausea or vomiting. 04/24/21  Yes Annitta Needs, NP  pantoprazole (PROTONIX) 40 MG tablet TAKE ONE TABLET (40MG TOTAL) BY MOUTH TWO TIMES DAILY BEFORE A MEAL Patient taking differently: Take 40 mg by mouth 2 (two) times daily. 05/11/21  Yes Erenest Rasher, PA-C  pravastatin (PRAVACHOL) 40 MG tablet Take 1 tablet (40 mg total) by mouth every evening. 07/31/21 02/09/28 Yes Imogene Burn, PA-C  lubiprostone (AMITIZA) 8 MCG capsule Take 8 mcg by mouth daily as needed for constipation.    [provider]  TRUE METRIX BLOOD GLUCOSE TEST test strip USE TO TEST BLOOD SUGAR UP TO TWICE DAILY OR AS DIRECTED 08/31/21   Renato Shin, MD     Review of Systems  Positive ROS: As above  All other systems have been reviewed and were otherwise negative with the exception of those mentioned in the HPI and as above.  Objective: Vital signs in last 24 hours: Temp:  [98.2 F (36.8 C)] 98.2 F (36.8 C) (06/19 0842) Pulse Rate:  [72] 72 (06/19 0842) Resp:  [17] 17 (06/19 0842) BP: (163)/(60)  163/60 (06/19 0842) SpO2:  [98 %] 98 % (06/19 0842) Weight:  [120.7 kg] 120.7 kg (06/19 0842) Estimated body mass index is 33.7 kg/m as calculated from the following:   Height as of this encounter: 6' 2.5" (1.892 m).   Weight as of this encounter: 120.7 kg.   General Appearance: Alert Head: Normocephalic, without obvious abnormality, atraumatic Eyes: PERRL, conjunctiva/corneas clear, EOM's intact,    Ears: Normal  Throat: Normal  Neck: Supple, Back: His lumbar incisions are well-healed. Lungs: Clear to auscultation bilaterally, respirations unlabored Heart: Regular rate and rhythm, no murmur, rub or gallop Abdomen: Soft, non-tender Extremities: Extremities normal, atraumatic, no cyanosis or edema Skin: unremarkable  NEUROLOGIC:   Mental status: alert and oriented,Motor Exam - grossly normal Sensory Exam - grossly normal Reflexes:  Coordination - grossly normal Gait - grossly normal Balance - grossly normal Cranial Nerves: I: smell Not tested  II: visual acuity  OS: Normal  OD: Normal   II: visual fields Full to confrontation  II: pupils Equal, round, reactive to light  III,VII: ptosis None  III,IV,VI: extraocular muscles  Full ROM  V: mastication Normal  V: facial light touch sensation  Normal  V,VII: corneal reflex  Present  VII: facial muscle function - upper  Normal  VII: facial muscle function - lower Normal  VIII: hearing Not tested  IX: soft palate elevation  Normal  IX,X: gag reflex Present  XI: trapezius strength  5/5  XI: sternocleidomastoid strength 5/5  XI: neck flexion strength  5/5  XII: tongue strength  Normal    Data Review Lab Results  Component Value Date   WBC 5.7 06/04/2022   HGB 9.5 (L) 06/04/2022   HCT 29.1 (L) 06/04/2022   MCV 101.4 (H) 06/04/2022   PLT 203 06/04/2022   Lab Results  Component Value Date   NA 139 06/04/2022   K 3.5 06/04/2022   CL 103 06/04/2022   CO2 25 06/04/2022   BUN 69 (H) 06/04/2022   CREATININE 3.00 (  H)  06/04/2022   GLUCOSE 149 (H) 06/04/2022   Lab Results  Component Value Date   INR 1.0 02/06/2022    Assessment/Plan: Chronic back pain: I have discussed the situation with the patient.  We have discussed the various treatment options including placement of a permanent spinal cord stimulator.  I described the surgery to him.  We have discussed the risk, benefits, alternatives, expected postop course, and likelihood of achieving our goals with surgery.  I have answered all his questions.  He has decided proceed with surgery.   [name] 06/10/2022 10:15 AM

## 2022-06-10 NOTE — Op Note (Signed)
Brief history: The patient is a 76 year old black male whose had multiple back surgeries.  He is developed chronic back pain.  He had a successful spinal cord stimulator trial.  He has decided to have 1 placed permanently.  Preop diagnosis: Failed back syndrome  Postop diagnosis: The same  Procedure: Placement of [employer] spinal cord stim later  Surgeon: Dr. Earle Gell  Assistant: Arnetha Massy, NP  Estimated blood loss: 50 cc  Specimens: None  Drains: None  Complications: None  Description of procedure: The patient was brought to the operating room by the anesthesia team.  General endotracheal anesthesia was induced.  He was turned to the prone position on the flat rolls.  The patient's thoracolumbar region was then prepared with Betadine scrub and Betadine solution.  Sterile drapes were applied.  I then injected the areas to be incised with Marcaine with epinephrine solution.  I used a scalpel to make a midline incision over the T9-10 interspace.  I used electrocautery to perform a right sided subperiosteal dissection exposing the right spinous process and lamina of T9-T10.  We confirmed our location with intraoperative fluoroscopy.  I then used a high-speed drill to create a right T9-10 laminotomy.  I widen the laminotomy with Kerrison punch and remove the ligamentum flavum.  This gave [place] access to epidural space.  I used the trial or 2 confirm there was room to place the epidural leads.  I then inserted the epidural leads into the posterior epidural space at T8.  We confirmed good position using intraoperative fluoroscopy.  I secured the leads through the interspinous ligament and using a 2-0 silk suture.  I then made a second incision the patient's right posterior lower quadrant.  I created a subcutaneous pocket using electrocautery.  I then used the shunt passer to tunneled the leads from the thoracic incision to the right posterior lower quadrant incision.  I then  connected the leads to the stimulator.  We confirmed good functioning with telemetry.  I secured the leads with a screwdriver.  I then reapproximated patient's thoracic fascia with interrupted 0 Vicryl suture.  I reapproximated the patient's thoracic and posterior incision subcutaneous tissue with interrupted 2-0 Vicryl suture.  We reapproximate the skin with Steri-Strips and benzoin.  The wound was then coated with bacitracin ointment.  Sterile dressings were applied.  The drapes were removed.  By report all sponge, instrument, and needle counts were correct at the end of this case.

## 2022-06-11 DIAGNOSIS — E1140 Type 2 diabetes mellitus with diabetic neuropathy, unspecified: Secondary | ICD-10-CM | POA: Diagnosis not present

## 2022-06-11 DIAGNOSIS — G4730 Sleep apnea, unspecified: Secondary | ICD-10-CM | POA: Diagnosis not present

## 2022-06-11 DIAGNOSIS — F32A Depression, unspecified: Secondary | ICD-10-CM | POA: Diagnosis not present

## 2022-06-11 DIAGNOSIS — E1122 Type 2 diabetes mellitus with diabetic chronic kidney disease: Secondary | ICD-10-CM | POA: Diagnosis not present

## 2022-06-11 DIAGNOSIS — Z955 Presence of coronary angioplasty implant and graft: Secondary | ICD-10-CM | POA: Diagnosis not present

## 2022-06-11 DIAGNOSIS — M1990 Unspecified osteoarthritis, unspecified site: Secondary | ICD-10-CM | POA: Diagnosis not present

## 2022-06-11 DIAGNOSIS — K219 Gastro-esophageal reflux disease without esophagitis: Secondary | ICD-10-CM | POA: Diagnosis not present

## 2022-06-11 DIAGNOSIS — I25119 Atherosclerotic heart disease of native coronary artery with unspecified angina pectoris: Secondary | ICD-10-CM | POA: Diagnosis not present

## 2022-06-11 DIAGNOSIS — G8929 Other chronic pain: Secondary | ICD-10-CM | POA: Diagnosis not present

## 2022-06-11 DIAGNOSIS — Z87891 Personal history of nicotine dependence: Secondary | ICD-10-CM | POA: Diagnosis not present

## 2022-06-11 DIAGNOSIS — I252 Old myocardial infarction: Secondary | ICD-10-CM | POA: Diagnosis not present

## 2022-06-11 DIAGNOSIS — N183 Chronic kidney disease, stage 3 unspecified: Secondary | ICD-10-CM | POA: Diagnosis not present

## 2022-06-11 DIAGNOSIS — D631 Anemia in chronic kidney disease: Secondary | ICD-10-CM | POA: Diagnosis not present

## 2022-06-11 DIAGNOSIS — I5030 Unspecified diastolic (congestive) heart failure: Secondary | ICD-10-CM | POA: Diagnosis not present

## 2022-06-11 DIAGNOSIS — M961 Postlaminectomy syndrome, not elsewhere classified: Secondary | ICD-10-CM | POA: Diagnosis not present

## 2022-06-11 DIAGNOSIS — M549 Dorsalgia, unspecified: Secondary | ICD-10-CM | POA: Diagnosis not present

## 2022-06-11 DIAGNOSIS — I130 Hypertensive heart and chronic kidney disease with heart failure and stage 1 through stage 4 chronic kidney disease, or unspecified chronic kidney disease: Secondary | ICD-10-CM | POA: Diagnosis not present

## 2022-06-11 LAB — GLUCOSE, CAPILLARY
Glucose-Capillary: 121 mg/dL — ABNORMAL HIGH (ref 70–99)
Glucose-Capillary: 134 mg/dL — ABNORMAL HIGH (ref 70–99)
Glucose-Capillary: 150 mg/dL — ABNORMAL HIGH (ref 70–99)
Glucose-Capillary: 170 mg/dL — ABNORMAL HIGH (ref 70–99)
Glucose-Capillary: 175 mg/dL — ABNORMAL HIGH (ref 70–99)
Glucose-Capillary: 180 mg/dL — ABNORMAL HIGH (ref 70–99)
Glucose-Capillary: 213 mg/dL — ABNORMAL HIGH (ref 70–99)

## 2022-06-11 NOTE — Plan of Care (Signed)

## 2022-06-11 NOTE — Progress Notes (Signed)
Subjective: The patient is alert and pleasant.  He is in no apparent distress.  He has not ambulated yet.  Objective: Vital signs in last 24 hours: Temp:  [97.8 F (36.6 C)-98.7 F (37.1 C)] 98.7 F (37.1 C) (06/20 0519) Pulse Rate:  [52-76] 53 (06/20 0519) Resp:  [11-20] 16 (06/20 0519) BP: (107-163)/(45-88) 124/55 (06/20 0519) SpO2:  [90 %-98 %] 97 % (06/20 0519) Weight:  [120.7 kg] 120.7 kg (06/19 0842) Estimated body mass index is 33.7 kg/m as calculated from the following:   Height as of this encounter: 6' 2.5" (1.892 m).   Weight as of this encounter: 120.7 kg.   Intake/Output from previous day: 06/19 0701 - 06/20 0700 In: 840 [P.O.:240; I.V.:500; IV Piggyback:100] Out: 425 [Urine:375; Blood:50] Intake/Output this shift: No intake/output data recorded.  Physical exam the patient is alert and pleasant.  He is moving his lower extremities well.  Lab Results: No results for input(s): "WBC", "HGB", "HCT", "PLT" in the last 72 hours. BMET No results for input(s): "NA", "K", "CL", "CO2", "GLUCOSE", "BUN", "CREATININE", "CALCIUM" in the last 72 hours.  Studies/Results: DG THORACOLUMABAR SPINE  Result Date: 06/10/2022 CLINICAL DATA:  Fluoroscopic assistance for placement of pain control lead EXAM: THORACOLUMBAR SPINE 1V COMPARISON:  None Available. FINDINGS: Fluoroscopic image shows placement of pain control lead in the thoracic spinal canal. Fluoroscopic time was 17 seconds. Radiation dose 11.94 mGy. IMPRESSION: Fluoroscopic assistance was provided for placement of pain control lead in the thoracic spinal canal. Electronically Signed   By: Elmer Picker M.D.   On: 06/10/2022 12:59   DG C-Arm 1-60 Min-No Report  Result Date: 06/10/2022 Fluoroscopy was utilized by the requesting physician.  No radiographic interpretation.   DG C-Arm 1-60 Min-No Report  Result Date: 06/10/2022 Fluoroscopy was utilized by the requesting physician.  No radiographic interpretation.     Assessment/Plan: Postop day #1: We will plan to discharge the patient later today, i.e. after he is mobilized with PT.  I gave him his discharge instructions and answered his questions.  LOS: 0 days     [name] 06/11/2022, 7:40 AM     Patient ID: [name], male   DOB: January 30, 1946, 76 y.o.   MRN: [id number]

## 2022-06-11 NOTE — Evaluation (Signed)
Physical Therapy Evaluation  Patient Details Name: [name] MRN: [id number] DOB: 10-15-1946 Today's Date: 06/11/2022  History of Present Illness  Pt is a 76 y/o male who presents s/p luimbar spinal cord stimulator placement on 06/10/2022. PMH significant for CAD, CKD III, DM II, dHF, gout, HTN, MI, neuropathy, vertigo, B TKA, L thumb amputation, prior back surgery x2.   Clinical Impression  Pt admitted with above diagnosis. At the time of PT eval, pt was able to demonstrate transfers with up to mod assist and ambulation with min guard assist and RW for support. Pt moving slowly and with difficulty managing from lower surfaces (pt is 6'2.5"). Pt was educated on precautions, positioning recommendations, appropriate activity progression, and car transfer. Pt currently with functional limitations due to the deficits listed below (see PT Problem List). Pt will benefit from skilled PT to increase their independence and safety with mobility to allow discharge to the venue listed below.         Recommendations for follow up therapy are one component of a multi-disciplinary discharge planning process, led by the attending physician.  Recommendations may be updated based on patient status, additional functional criteria and insurance authorization.  Follow Up Recommendations No PT follow up    Assistance Recommended at Discharge PRN  Patient can return home with the following  A little help with walking and/or transfers;A little help with bathing/dressing/bathroom    Equipment Recommendations None recommended by PT  Recommendations for Other Services       Functional Status Assessment Patient has had a recent decline in their functional status and demonstrates the ability to make significant improvements in function in a reasonable and predictable amount of time.     Precautions / Restrictions Precautions Precautions: Fall;Back Precaution Booklet Issued: No Precaution Comments: Reviewed  precautions and pt was cued for maintenance of precautions during functional mobility. Restrictions Weight Bearing Restrictions: No      Mobility  Bed Mobility Overal bed mobility: Needs Assistance Bed Mobility: Rolling, Sidelying to Sit Rolling: Min assist Sidelying to sit: Mod assist       General bed mobility comments: VC's for log roll technique. Pt has an adjustable bed at home so allowed for [facility] to be elevated. Pt required assist for roll as well as trunk elevation to full sitting position.    Transfers Overall transfer level: Needs assistance Equipment used: Rolling walker (2 wheels) Transfers: Sit to/from Stand Sit to Stand: Min guard           General transfer comment: Hands on guarding for balance support as pt powered up to full stand. Pt asking for bed height to be significantly elevated. Encouraged pt to try at a slightly elevated level. Increased time to complete.    Ambulation/Gait Ambulation/Gait assistance: Min guard Gait Distance (Feet): 200 Feet Assistive device: Rolling walker (2 wheels) Gait Pattern/deviations: Step-through pattern, Decreased stride length, Trunk flexed Gait velocity: Decreased Gait velocity interpretation: 1.31 - 2.62 ft/sec, indicative of limited community ambulator   General Gait Details: VC's for improved posture, closer walker proximity, and forward gaze. No overt LOB noted however pt moving slow and guarded.  Stairs Stairs: Yes Stairs assistance: Min guard Stair Management: Two rails, Step to pattern, Forwards Number of Stairs: 3 General stair comments: Lower step height of practice stairs in rehab gym. Able to complete without difficulty, however increased time required.  Wheelchair Mobility    Modified Rankin (Stroke Patients Only)       Balance Overall balance assessment: Needs  assistance Sitting-balance support: No upper extremity supported, Feet supported Sitting balance-Leahy Scale: Fair     Standing  balance support: Bilateral upper extremity supported, During functional activity, Reliant on assistive device for balance Standing balance-Leahy Scale: Poor Standing balance comment: Reliant on RW                             Pertinent Vitals/Pain Pain Assessment Pain Assessment: Faces Faces Pain Scale: Hurts a little bit Pain Location: back Pain Descriptors / Indicators: Operative site guarding, Tender Pain Intervention(s): Limited activity within patient's tolerance, Monitored during session, Repositioned    Home Living Family/patient expects to be discharged to:: Private residence Living Arrangements: Alone Available Help at Discharge: Family;Available PRN/intermittently Type of Home: House Home Access: Stairs to enter Entrance Stairs-Rails: Right;Left;Can reach both Entrance Stairs-Number of Steps: 3   Home Layout: One level Home Equipment: Conservation officer, nature (2 wheels);Cane - single point;Grab bars - tub/shower;BSC/3in1      Prior Function Prior Level of Function : Independent/Modified Independent;Driving ("Struggling but able to manage")             Mobility Comments: Using the cane for everything       Hand Dominance   Dominant Hand: Right    Extremity/Trunk Assessment   Upper Extremity Assessment Upper Extremity Assessment: Defer to OT evaluation    Lower Extremity Assessment Lower Extremity Assessment: Generalized weakness (Consistent with pre-op diagnosis)    Cervical / Trunk Assessment Cervical / Trunk Assessment: Back Surgery  Communication   Communication: No difficulties  Cognition Arousal/Alertness: Awake/alert Behavior During Therapy: WFL for tasks assessed/performed Overall Cognitive Status: Within Functional Limits for tasks assessed                                          General Comments      Exercises     Assessment/Plan    PT Assessment Patient needs continued PT services  PT Problem List Decreased  strength;Decreased activity tolerance;Decreased mobility;Decreased balance;Decreased knowledge of use of DME;Decreased safety awareness;Decreased knowledge of precautions;Pain       PT Treatment Interventions DME instruction;Stair training;Gait training;Functional mobility training;Therapeutic activities;Therapeutic exercise;Balance training;Patient/family education    PT Goals (Current goals can be found in the Care Plan section)  Acute Rehab PT Goals Patient Stated Goal: Get back on the golf course in ~6-8 weeks PT Goal Formulation: With patient Time For Goal Achievement: 06/18/22 Potential to Achieve Goals: Good    Frequency Min 5X/week     Co-evaluation               AM-PAC PT "6 Clicks" Mobility  Outcome Measure Help needed turning from your back to your side while in a flat bed without using bedrails?: A Little Help needed moving from lying on your back to sitting on the side of a flat bed without using bedrails?: A Lot Help needed moving to and from a bed to a chair (including a wheelchair)?: A Little Help needed standing up from a chair using your arms (e.g., wheelchair or bedside chair)?: A Little Help needed to walk in hospital room?: A Little Help needed climbing 3-5 steps with a railing? : A Little 6 Click Score: 17    End of Session Equipment Utilized During Treatment: Gait belt Activity Tolerance: Patient tolerated treatment well Patient left: in chair;with call bell/phone within reach Nurse Communication:  Mobility status PT Visit Diagnosis: Unsteadiness on feet (R26.81)    Time: 2376-2831 PT Time Calculation (min) (ACUTE ONLY): 42 min   Charges:   PT Evaluation $PT Eval Moderate Complexity: 1 Mod PT Treatments $Gait Training: 23-37 mins        Rolinda Roan, PT, DPT Acute Rehabilitation Services Secure Chat Preferred Office: [phone]   Thelma Comp 06/11/2022, 9:39 AM

## 2022-06-11 NOTE — Evaluation (Addendum)
Occupational Therapy Evaluation Patient Details Name: [name] MRN: [id number] DOB: Oct 16, 1946 Today's Date: 06/11/2022   History of Present Illness Pt is a 76 y/o male who presents s/p luimbar spinal cord stimulator placement on 06/10/2022. PMH significant for CAD, CKD III, DM II, dHF, gout, HTN, MI, neuropathy, vertigo, B TKA, L thumb amputation, prior back surgery x2.   Clinical Impression   Pt reports independence at baseline with ADLs although needing increased time, uses cane for mobility. Pt lives alone but states family will be able to assist PRN at d/c. Pt currently supervision-mod A for ADLs, and min guard for transfer from chair surface using RW. Pt educated on use of reacher, and sock aid for LB dressing as pt unable to perform figure 4. Educated pt on bed mobility, and use of BSC as shower seat. Pt verbalizes understanding. Pt unable to recall precautions and bed mobility from PT session, reviewed with pt. Pt presenting with impairments listed below, will follow acutely. Recommend HHOT at d/c.     Recommendations for follow up therapy are one component of a multi-disciplinary discharge planning process, led by the attending physician.  Recommendations may be updated based on patient status, additional functional criteria and insurance authorization.   Follow Up Recommendations  Home health OT    Assistance Recommended at Discharge Intermittent Supervision/Assistance  Patient can return home with the following A little help with walking and/or transfers;A little help with bathing/dressing/bathroom;Assistance with cooking/housework;Assist for transportation;Help with stairs or ramp for entrance    Functional Status Assessment  Patient has had a recent decline in their functional status and demonstrates the ability to make significant improvements in function in a reasonable and predictable amount of time.  Equipment Recommendations  None recommended by OT (pt has all needed  DME)    Recommendations for Other Services PT consult     Precautions / Restrictions Precautions Precautions: Fall;Back Precaution Booklet Issued: No Precaution Comments: pt educated on 3/3 back preacautions, needs reinforcement Restrictions Weight Bearing Restrictions: No      Mobility Bed Mobility               General bed mobility comments: up in chair upon arrival, reviewed log rolling technique with pt    Transfers Overall transfer level: Needs assistance Equipment used: Rolling walker (2 wheels) Transfers: Sit to/from Stand Sit to Stand: Min guard                  Balance Overall balance assessment: Needs assistance Sitting-balance support: No upper extremity supported, Feet supported Sitting balance-Leahy Scale: Fair Sitting balance - Comments: able to reach minimally outside BOS without LOB/pain   Standing balance support: Bilateral upper extremity supported, During functional activity, Reliant on assistive device for balance Standing balance-Leahy Scale: Poor Standing balance comment: Reliant on external support/RW                           ADL either performed or assessed with clinical judgement   ADL Overall ADL's : Needs assistance/impaired Eating/Feeding: Modified independent;Sitting   Grooming: Minimal assistance;Sitting;Standing Grooming Details (indicate cue type and reason): for fine motor aspect of task Upper Body Bathing: Supervision/ safety;Sitting   Lower Body Bathing: Adhering to back precautions;Sitting/lateral leans;Sit to/from stand;Moderate assistance   Upper Body Dressing : Supervision/safety;Sitting;Standing Upper Body Dressing Details (indicate cue type and reason): to don gown on backside Lower Body Dressing: Moderate assistance;Sitting/lateral leans;Adhering to back precautions;With adaptive equipment Lower Body Dressing Details (indicate  cue type and reason): with use of AE Toilet Transfer: Min guard;Rolling  walker (2 wheels);Ambulation;Regular Glass blower/designer Details (indicate cue type and reason): simulated in room Toileting- Clothing Manipulation and Hygiene: Supervision/safety;Sitting/lateral lean       Functional mobility during ADLs: Min guard;Rolling walker (2 wheels)       Vision   Vision Assessment?: No apparent visual deficits     Perception     Praxis      Pertinent Vitals/Pain Pain Assessment Pain Assessment: Faces Pain Score: 8  Faces Pain Scale: Hurts whole lot Pain Location: back Pain Descriptors / Indicators: Operative site guarding, Tender Pain Intervention(s): Limited activity within patient's tolerance, Monitored during session, Repositioned     Hand Dominance Right   Extremity/Trunk Assessment Upper Extremity Assessment Upper Extremity Assessment: LUE deficits/detail;RUE deficits/detail RUE Deficits / Details: unable to flex shoulder past 30* at this time due to pain, tremor noted with fine motor tasks RUE: Unable to fully assess due to pain RUE Coordination: decreased fine motor;decreased gross motor LUE Deficits / Details: hx of thumb amputation, tremor noted with fine motor coordination tasks LUE Coordination: decreased fine motor   Lower Extremity Assessment Lower Extremity Assessment: Defer to PT evaluation   Cervical / Trunk Assessment Cervical / Trunk Assessment: Back Surgery   Communication Communication Communication: No difficulties   Cognition Arousal/Alertness: Awake/alert Behavior During Therapy: Flat affect Overall Cognitive Status: No family/caregiver present to determine baseline cognitive functioning                                 General Comments: pt with no recall of precautions, reviewed with pt, confirms home set up     General Comments  VSS on RA    Exercises     Shoulder Instructions      Home Living Family/patient expects to be discharged to:: Private residence Living Arrangements:  Alone Available Help at Discharge: Family;Available PRN/intermittently Type of Home: House Home Access: Stairs to enter [employer] of Steps: 3 Entrance Stairs-Rails: Right;Left;Can reach both Home Layout: One level     Bathroom Shower/Tub: Occupational psychologist: Standard Bathroom Accessibility: Yes   Home Equipment: Conservation officer, nature (2 wheels);Cane - single point;Grab bars - tub/shower;BSC/3in1   Additional Comments: niece can assist PRN per pt      Prior Functioning/Environment Prior Level of Function : Independent/Modified Independent;Driving             Mobility Comments: Using the cane for everything ADLs Comments: increase time and effort, but ind with IADLs/ADLs        OT Problem List: Decreased strength;Decreased range of motion;Decreased activity tolerance;Impaired balance (sitting and/or standing);Decreased safety awareness;Decreased knowledge of precautions;Impaired UE functional use      OT Treatment/Interventions: Self-care/ADL training;Energy conservation;DME and/or AE instruction;Therapeutic exercise;Therapeutic activities;Patient/family education;Balance training;Cognitive remediation/compensation;Visual/perceptual remediation/compensation    OT Goals(Current goals can be found in the care plan section) Acute Rehab OT Goals Patient Stated Goal: to go home OT Goal Formulation: With patient Time For Goal Achievement: 06/25/22 Potential to Achieve Goals: Good ADL Goals Pt Will Perform Lower Body Dressing: with adaptive equipment;with min assist;sitting/lateral leans;sit to/from stand Pt Will Transfer to Toilet: with modified independence;ambulating;regular height toilet Pt Will Perform Tub/Shower Transfer: Shower transfer;3 in 1;rolling walker;ambulating;with supervision Additional ADL Goal #1: pt will complete bed mobility mod I in prep for ADLs  OT Frequency: Min 3X/week    Co-evaluation  AM-PAC OT "6 Clicks"  Daily Activity     Outcome Measure Help from another person eating meals?: None Help from another person taking care of personal grooming?: A Little Help from another person toileting, which includes using toliet, bedpan, or urinal?: A Little Help from another person bathing (including washing, rinsing, drying)?: A Lot Help from another person to put on and taking off regular upper body clothing?: A Little Help from another person to put on and taking off regular lower body clothing?: A Lot 6 Click Score: 17   End of Session Equipment Utilized During Treatment: Gait belt;Rolling walker (2 wheels) Nurse Communication: Mobility status  Activity Tolerance: Patient limited by pain Patient left: in chair;with call bell/phone within reach  OT Visit Diagnosis: Unsteadiness on feet (R26.81);Other abnormalities of gait and mobility (R26.89);Muscle weakness (generalized) (M62.81)                Time: 7893-8101 OT Time Calculation (min): 34 min Charges:  OT General Charges $OT Visit: 1 Visit OT Evaluation $OT Eval Moderate Complexity: 1 Mod OT Treatments $Self Care/Home Management : 8-22 mins  Lynnda Child, OTD, OTR/L Acute Rehab [phone]) [phone] - [place] 06/11/2022, 11:45 AM

## 2022-06-12 ENCOUNTER — Encounter (HOSPITAL_COMMUNITY): Payer: Self-pay | Admitting: Neurosurgery

## 2022-06-12 DIAGNOSIS — I25119 Atherosclerotic heart disease of native coronary artery with unspecified angina pectoris: Secondary | ICD-10-CM | POA: Diagnosis not present

## 2022-06-12 DIAGNOSIS — I130 Hypertensive heart and chronic kidney disease with heart failure and stage 1 through stage 4 chronic kidney disease, or unspecified chronic kidney disease: Secondary | ICD-10-CM | POA: Diagnosis not present

## 2022-06-12 DIAGNOSIS — I5030 Unspecified diastolic (congestive) heart failure: Secondary | ICD-10-CM | POA: Diagnosis not present

## 2022-06-12 DIAGNOSIS — I252 Old myocardial infarction: Secondary | ICD-10-CM | POA: Diagnosis not present

## 2022-06-12 DIAGNOSIS — N183 Chronic kidney disease, stage 3 unspecified: Secondary | ICD-10-CM | POA: Diagnosis not present

## 2022-06-12 DIAGNOSIS — M1990 Unspecified osteoarthritis, unspecified site: Secondary | ICD-10-CM | POA: Diagnosis not present

## 2022-06-12 DIAGNOSIS — F32A Depression, unspecified: Secondary | ICD-10-CM | POA: Diagnosis not present

## 2022-06-12 DIAGNOSIS — K219 Gastro-esophageal reflux disease without esophagitis: Secondary | ICD-10-CM | POA: Diagnosis not present

## 2022-06-12 DIAGNOSIS — G8929 Other chronic pain: Secondary | ICD-10-CM | POA: Diagnosis not present

## 2022-06-12 DIAGNOSIS — M961 Postlaminectomy syndrome, not elsewhere classified: Secondary | ICD-10-CM | POA: Diagnosis not present

## 2022-06-12 DIAGNOSIS — Z87891 Personal history of nicotine dependence: Secondary | ICD-10-CM | POA: Diagnosis not present

## 2022-06-12 DIAGNOSIS — E1140 Type 2 diabetes mellitus with diabetic neuropathy, unspecified: Secondary | ICD-10-CM | POA: Diagnosis not present

## 2022-06-12 DIAGNOSIS — E1122 Type 2 diabetes mellitus with diabetic chronic kidney disease: Secondary | ICD-10-CM | POA: Diagnosis not present

## 2022-06-12 DIAGNOSIS — M549 Dorsalgia, unspecified: Secondary | ICD-10-CM | POA: Diagnosis not present

## 2022-06-12 DIAGNOSIS — G4730 Sleep apnea, unspecified: Secondary | ICD-10-CM | POA: Diagnosis not present

## 2022-06-12 DIAGNOSIS — Z955 Presence of coronary angioplasty implant and graft: Secondary | ICD-10-CM | POA: Diagnosis not present

## 2022-06-12 DIAGNOSIS — D631 Anemia in chronic kidney disease: Secondary | ICD-10-CM | POA: Diagnosis not present

## 2022-06-12 LAB — GLUCOSE, CAPILLARY
Glucose-Capillary: 139 mg/dL — ABNORMAL HIGH (ref 70–99)
Glucose-Capillary: 163 mg/dL — ABNORMAL HIGH (ref 70–99)

## 2022-06-12 NOTE — Plan of Care (Signed)

## 2022-06-12 NOTE — Plan of Care (Signed)
Pt is alert oriented x 4. Pt is RA. C/o abdominal pain and nausea, prn tylenol and prn zofran given, decreased. Pt c/o back pain, prn norco given per order. Pt has male purewick in place.     Problem: Education: Goal: Knowledge of General Education information will improve Description: Including pain rating scale, medication(s)/side effects and non-pharmacologic comfort measures Outcome: Progressing   Problem: Health Behavior/Discharge Planning: Goal: Ability to manage health-related needs will improve Outcome: Progressing   Problem: Clinical Measurements: Goal: Ability to maintain clinical measurements within normal limits will improve Outcome: Progressing   Problem: Clinical Measurements: Goal: Ability to maintain clinical measurements within normal limits will improve Outcome: Progressing Goal: Will remain free from infection Outcome: Progressing Goal: Diagnostic test results will improve Outcome: Progressing Goal: Respiratory complications will improve Outcome: Progressing Goal: Cardiovascular complication will be avoided Outcome: Progressing   Problem: Activity: Goal: Risk for activity intolerance will decrease Outcome: Progressing   Problem: Nutrition: Goal: Adequate nutrition will be maintained Outcome: Progressing   Problem: Coping: Goal: Level of anxiety will decrease Outcome: Progressing   Problem: Elimination: Goal: Will not experience complications related to bowel motility Outcome: Progressing Goal: Will not experience complications related to urinary retention Outcome: Progressing   Problem: Pain Managment: Goal: General experience of comfort will improve Outcome: Progressing   Problem: Safety: Goal: Ability to remain free from injury will improve Outcome: Progressing   Problem: Skin Integrity: Goal: Risk for impaired skin integrity will decrease Outcome: Progressing   Problem: Education: Goal: Ability to describe self-care measures that may  prevent or decrease complications (Diabetes Survival Skills Education) will improve Outcome: Progressing Goal: Individualized Educational Video(s) Outcome: Progressing   Problem: Coping: Goal: Ability to adjust to condition or change in health will improve Outcome: Progressing   Problem: Fluid Volume: Goal: Ability to maintain a balanced intake and output will improve Outcome: Progressing   Problem: Health Behavior/Discharge Planning: Goal: Ability to identify and utilize available resources and services will improve Outcome: Progressing Goal: Ability to manage health-related needs will improve Outcome: Progressing   Problem: Metabolic: Goal: Ability to maintain appropriate glucose levels will improve Outcome: Progressing   Problem: Nutritional: Goal: Maintenance of adequate nutrition will improve Outcome: Progressing Goal: Progress toward achieving an optimal weight will improve Outcome: Progressing   Problem: Skin Integrity: Goal: Risk for impaired skin integrity will decrease Outcome: Progressing   Problem: Skin Integrity: Goal: Risk for impaired skin integrity will decrease Outcome: Progressing   Problem: Skin Integrity: Goal: Risk for impaired skin integrity will decrease Outcome: Progressing   Problem: Tissue Perfusion: Goal: Adequacy of tissue perfusion will improve Outcome: Progressing

## 2022-06-12 NOTE — Progress Notes (Signed)
Occupational Therapy Treatment Patient Details Name: [name] MRN: [id number] DOB: 1946-12-12 Today's Date: 06/12/2022   History of present illness Pt is a 75 y/o male who presents s/p luimbar spinal cord stimulator placement on 06/10/2022. PMH significant for CAD, CKD III, DM II, dHF, gout, HTN, MI, neuropathy, vertigo, B TKA, L thumb amputation, prior back surgery x2.   OT comments  Pt progressing towards goals, requiring min guard for transfers using RW. Pt requiring mod A for LB dressing and mod I for UB dressing in prep for d/c, pt able to pull up pants/undergarments once past knee level. Reviewed use of BSC as shower seat for safety with pt along with precautions. Pt presenting with impairments listed below, continue to recommend HHOT at d/c.   Recommendations for follow up therapy are one component of a multi-disciplinary discharge planning process, led by the attending physician.  Recommendations may be updated based on patient status, additional functional criteria and insurance authorization.    Follow Up Recommendations  Home health OT    Assistance Recommended at Discharge Intermittent Supervision/Assistance  Patient can return home with the following  A little help with walking and/or transfers;A little help with bathing/dressing/bathroom;Assistance with cooking/housework;Assist for transportation;Help with stairs or ramp for entrance   Equipment Recommendations  None recommended by OT (pt has all needed DME)    Recommendations for Other Services PT consult    Precautions / Restrictions Precautions Precautions: Fall;Back Precaution Booklet Issued: No Precaution Comments: pt educated on 3/3 back preacautions, needs reinforcement Restrictions Weight Bearing Restrictions: No       Mobility Bed Mobility               General bed mobility comments: sitting up EOB upon arrival    Transfers Overall transfer level: Needs assistance Equipment used: Rolling  walker (2 wheels) Transfers: Sit to/from Stand Sit to Stand: Min guard                 Balance Overall balance assessment: Needs assistance Sitting-balance support: No upper extremity supported, Feet supported Sitting balance-Leahy Scale: Fair Sitting balance - Comments: able to reach minimally outside BOS without LOB/pain   Standing balance support: Bilateral upper extremity supported, During functional activity, Reliant on assistive device for balance Standing balance-Leahy Scale: Poor Standing balance comment: Reliant on external support/RW                           ADL either performed or assessed with clinical judgement   ADL Overall ADL's : Needs assistance/impaired                 Upper Body Dressing : Modified independent;Sitting   Lower Body Dressing: Moderate assistance;Sitting/lateral leans Lower Body Dressing Details (indicate cue type and reason): assist to don over feet, pt able to pull up pants/boxers up from knee level Toilet Transfer: Min guard;Rolling walker (2 wheels);Ambulation;Regular Glass blower/designer Details (indicate cue type and reason): simulated in room         Functional mobility during ADLs: Min guard;Rolling walker (2 wheels)      Extremity/Trunk Assessment Upper Extremity Assessment Upper Extremity Assessment: LUE deficits/detail RUE Deficits / Details: unable to flex shoulder past 30* at this time due to pain, tremor noted with fine motor tasks RUE Coordination: decreased fine motor;decreased gross motor LUE Deficits / Details: hx of thumb amputation, tremor noted with fine motor coordination tasks LUE Coordination: decreased fine motor   Lower Extremity Assessment Lower  Extremity Assessment: Defer to PT evaluation        Vision   Vision Assessment?: No apparent visual deficits   Perception Perception Perception: Not tested   Praxis Praxis Praxis: Not tested    Cognition Arousal/Alertness:  Awake/alert Behavior During Therapy: Flat affect Overall Cognitive Status: No family/caregiver present to determine baseline cognitive functioning                                          Exercises      Shoulder Instructions       General Comments VSS On RA    Pertinent Vitals/ Pain       Pain Assessment Pain Assessment: Faces Pain Score: 5  Faces Pain Scale: Hurts little more Pain Location: back Pain Descriptors / Indicators: Operative site guarding, Tender Pain Intervention(s): Limited activity within patient's tolerance, Monitored during session, Repositioned  Home Living Family/patient expects to be discharged to:: Private residence Living Arrangements: Alone                                      Prior Functioning/Environment              Frequency  Min 3X/week        Progress Toward Goals  OT Goals(current goals can now be found in the care plan section)  Progress towards OT goals: Progressing toward goals  Acute Rehab OT Goals Patient Stated Goal: to go home OT Goal Formulation: With patient Time For Goal Achievement: 06/25/22 Potential to Achieve Goals: Good ADL Goals Pt Will Perform Lower Body Dressing: with adaptive equipment;with min assist;sitting/lateral leans;sit to/from stand Pt Will Transfer to Toilet: with modified independence;ambulating;regular height toilet Pt Will Perform Tub/Shower Transfer: Shower transfer;3 in 1;rolling walker;ambulating;with supervision Additional ADL Goal #1: pt will complete bed mobility mod I in prep for ADLs  Plan Discharge plan remains appropriate;Frequency remains appropriate    Co-evaluation                 AM-PAC OT "6 Clicks" Daily Activity     Outcome Measure   Help from another person eating meals?: None Help from another person taking care of personal grooming?: A Little Help from another person toileting, which includes using toliet, bedpan, or urinal?: A  Little Help from another person bathing (including washing, rinsing, drying)?: A Lot Help from another person to put on and taking off regular upper body clothing?: A Little Help from another person to put on and taking off regular lower body clothing?: A Lot 6 Click Score: 17    End of Session Equipment Utilized During Treatment: Gait belt;Rolling walker (2 wheels)  OT Visit Diagnosis: Unsteadiness on feet (R26.81);Other abnormalities of gait and mobility (R26.89);Muscle weakness (generalized) (M62.81)   Activity Tolerance Patient limited by pain   Patient Left in chair;with call bell/phone within reach   Nurse Communication Mobility status        Time: 7681-1572 OT Time Calculation (min): 12 min  Charges: OT General Charges $OT Visit: 1 Visit OT Treatments $Self Care/Home Management : 8-22 mins  Lynnda Child, OTD, OTR/L Acute Rehab [phone]) [phone]   Kaylyn Lim 06/12/2022, 10:53 AM

## 2022-06-12 NOTE — Progress Notes (Signed)
Physical Therapy Treatment Patient Details Name: [name] MRN: [id number] DOB: 11-Jul-1946 Today's Date: 06/12/2022   History of Present Illness Pt is a 76 y/o male who presents s/p luimbar spinal cord stimulator placement on 06/10/2022. PMH significant for CAD, CKD III, DM II, dHF, gout, HTN, MI, neuropathy, vertigo, B TKA, L thumb amputation, prior back surgery x2.    PT Comments    Pt progressing towards physical therapy goals. Family member present for education as well. Reinforced education on precautions, activity progression, positioning recommendations, and car transfer. Pt eager to d/c, not engaged in session and appears frustrated with need for therapy session prior to d/c. Overall recommend pt have light supervision at home at least this first day home for optimal safety as pt not consistently recalling precautions or maintaining precautions during functional mobility. Will continue to follow.     Recommendations for follow up therapy are one component of a multi-disciplinary discharge planning process, led by the attending physician.  Recommendations may be updated based on patient status, additional functional criteria and insurance authorization.  Follow Up Recommendations  No PT follow up     Assistance Recommended at Discharge PRN  Patient can return home with the following A little help with walking and/or transfers;A little help with bathing/dressing/bathroom   Equipment Recommendations  None recommended by PT    Recommendations for Other Services       Precautions / Restrictions Precautions Precautions: Fall;Back Precaution Booklet Issued: No Precaution Comments: Pt educated on 3/3 back preacautions, needs reinforcement. Able to recall 2/3 initially. Restrictions Weight Bearing Restrictions: No     Mobility  Bed Mobility               General bed mobility comments: Pt was received sitting up in recliner with family member present in the room.     Transfers Overall transfer level: Needs assistance Equipment used: Rolling walker (2 wheels) Transfers: Sit to/from Stand Sit to Stand: Supervision           General transfer comment: Close supervision for safety as pt powered up to full stand. Increased time required. Pt demonstrated proper hand placement on seated surface for safety.    Ambulation/Gait Ambulation/Gait assistance: Min guard, Supervision Gait Distance (Feet): 200 Feet Assistive device: Rolling walker (2 wheels) Gait Pattern/deviations: Step-through pattern, Decreased stride length, Trunk flexed Gait velocity: Decreased Gait velocity interpretation: 1.31 - 2.62 ft/sec, indicative of limited community ambulator   General Gait Details: VC's for improved posture, closer walker proximity, and forward gaze. Pt making no corrective changes. Overall moving slow and guarded. No overt LOB noted.   Stairs Stairs: Yes Stairs assistance: Min guard Stair Management: Two rails, Step to pattern, Forwards Number of Stairs: 3 General stair comments: Lower step height of practice stairs in rehab gym. Able to complete without difficulty, however increased time required. Family member present for education on walker placement at top of stairs before pt begins to ascend them.   Wheelchair Mobility    Modified Rankin (Stroke Patients Only)       Balance Overall balance assessment: Needs assistance Sitting-balance support: No upper extremity supported, Feet supported Sitting balance-Leahy Scale: Fair Sitting balance - Comments: able to reach minimally outside BOS without LOB/pain   Standing balance support: Bilateral upper extremity supported, During functional activity, Reliant on assistive device for balance Standing balance-Leahy Scale: Poor Standing balance comment: Reliant on external support/RW  Cognition Arousal/Alertness: Awake/alert Behavior During Therapy: Flat  affect Overall Cognitive Status: Impaired/Different from baseline Area of Impairment: Memory, Safety/judgement, Problem solving                     Memory: Decreased recall of precautions, Decreased short-term memory   Safety/Judgement: Decreased awareness of safety, Decreased awareness of deficits   Problem Solving: Slow processing, Requires verbal cues          Exercises      General Comments General comments (skin integrity, edema, etc.): VSS On RA      Pertinent Vitals/Pain Pain Assessment Pain Assessment: Faces Faces Pain Scale: Hurts little more Pain Location: back Pain Descriptors / Indicators: Operative site guarding, Tender Pain Intervention(s): Limited activity within patient's tolerance, Monitored during session, Repositioned, Patient requesting pain meds-RN notified    Home Living Family/patient expects to be discharged to:: Private residence Living Arrangements: Alone                      Prior Function            PT Goals (current goals can now be found in the care plan section) Acute Rehab PT Goals Patient Stated Goal: Get back on the golf course in ~6-8 weeks PT Goal Formulation: With patient Time For Goal Achievement: 06/18/22 Potential to Achieve Goals: Good Progress towards PT goals: Progressing toward goals    Frequency    Min 5X/week      PT Plan Current plan remains appropriate    Co-evaluation              AM-PAC PT "6 Clicks" Mobility   Outcome Measure  Help needed turning from your back to your side while in a flat bed without using bedrails?: A Little Help needed moving from lying on your back to sitting on the side of a flat bed without using bedrails?: A Lot Help needed moving to and from a bed to a chair (including a wheelchair)?: A Little Help needed standing up from a chair using your arms (e.g., wheelchair or bedside chair)?: A Little Help needed to walk in hospital room?: A Little Help needed  climbing 3-5 steps with a railing? : A Little 6 Click Score: 17    End of Session Equipment Utilized During Treatment: Gait belt Activity Tolerance: Patient tolerated treatment well Patient left: in chair;with call bell/phone within reach Nurse Communication: Mobility status PT Visit Diagnosis: Unsteadiness on feet (R26.81)     Time: 1224-8250 PT Time Calculation (min) (ACUTE ONLY): 19 min  Charges:  $Gait Training: 8-22 mins                     Rolinda Roan, PT, DPT Acute Rehabilitation Services Secure Chat Preferred Office: [phone]    Thelma Comp 06/12/2022, 11:25 AM

## 2022-06-12 NOTE — Discharge Summary (Signed)
Physician Discharge Summary  Patient ID: [name] MRN: [id number] DOB/AGE: 01/28/1946 76 y.o.  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  The surgery went well.  His postoperative course was unremarkable.  On postoperative day #2 he requested discharge to home.  He was given verbal and written discharge instructions.  All his questions were answered.  Consults: PT, OT, care management Significant Diagnostic Studies: None Treatments: Placement of [employer] spinal cord stimulator Discharge Exam: Blood pressure (!) 141/55, pulse 60, temperature 98.9 F (37.2 C), temperature source Oral, resp. rate 17, height 6' 2.5" (1.892 m), weight 120.7 kg, SpO2 97 %. The patient is alert and pleasant.  He looks well.  His strength is normal.  Disposition: Home  Discharge Instructions     Call MD for:  difficulty breathing, headache or visual disturbances   Complete by: As directed    Call MD for:  extreme fatigue   Complete by: As directed    Call MD for:  hives   Complete by: As directed    Call MD for:  persistant dizziness or light-headedness   Complete by: As directed    Call MD for:  persistant nausea and vomiting   Complete by: As directed    Call MD for:  redness, tenderness, or signs of infection (pain, swelling, redness, odor or green/yellow discharge around incision site)   Complete by: As directed    Call MD for:  severe uncontrolled pain   Complete by: As directed    Call MD for:  temperature >100.4   Complete by: As directed    Diet - low sodium heart healthy   Complete by: As directed    Discharge instructions   Complete by: As directed    Call [phone] for a  followup appointment. Take a stool softener while you are using pain medications.   Driving Restrictions   Complete by: As directed    Do not drive for 2 weeks.   Increase activity slowly   Complete by: As directed    Lifting restrictions   Complete by: As directed    Do not lift more than 5 pounds. No excessive bending or twisting.   May shower / Bathe   Complete by: As directed    Remove the dressing for 3 days after surgery.  You may shower, but leave the incision alone.   Remove dressing in 24 hours   Complete by: As directed       Allergies as of 06/12/2022       Reactions   Metformin Nausea And Vomiting   Niacin    Niacin And Related Other (See Comments)   Reaction: flushing and burning side effects even with extended release        Medication List     STOP taking these medications    acetaminophen 650 MG CR tablet Commonly known as: TYLENOL       TAKE these medications    acarbose 50 MG tablet Commonly known as: PRECOSE Take 50 mg by mouth in the morning, at noon, and at bedtime.   allopurinol 100 MG tablet Commonly known as: ZYLOPRIM Take 100 mg by mouth daily.   amLODipine 10 MG tablet Commonly known as: NORVASC Take 10  mg by mouth every evening.   aspirin EC 81 MG tablet Take 1 tablet (81 mg total) by mouth daily.   calcitRIOL 0.25 MCG capsule Commonly known as: ROCALTROL Take 0.25 mcg by mouth every Sunday.   carboxymethylcellulose 0.5 % Soln Commonly known as: REFRESH PLUS Place 1 drop into both eyes daily as needed (dry eyes).   carvedilol 6.25 MG tablet Commonly known as: COREG Take 6.25 mg by mouth 2 (two) times daily with a meal.   cholecalciferol 1000 units tablet Commonly known as: VITAMIN D Take 1,000 Units daily by mouth.   cloNIDine 0.1 MG tablet Commonly known as: CATAPRES Take 0.1 mg by mouth 3 (three) times daily.   furosemide 40 MG tablet Commonly known as: LASIX Take 1 tablet (40 mg total) by mouth daily. What  changed: when to take this   gabapentin 300 MG capsule Commonly known as: NEURONTIN Take 300 mg by mouth 3 (three) times daily.   hydrALAZINE 100 MG tablet Commonly known as: APRESOLINE Take 200 mg by mouth 2 (two) times daily.   HYDROcodone-acetaminophen 10-325 MG tablet Commonly known as: NORCO Take 1-2 tablets by mouth 4 (four) times daily as needed for pain.   insulin NPH Human 100 UNIT/ML injection Commonly known as: NovoLIN N Inject 0.18 mLs (18 Units total) into the skin every morning. Ands syringes 1/day What changed:  how much to take when to take this reasons to take this additional instructions   isosorbide mononitrate 60 MG 24 hr tablet Commonly known as: IMDUR Take 1 tablet (60 mg total) by mouth daily.   lubiprostone 8 MCG capsule Commonly known as: AMITIZA Take 8 mcg by mouth daily as needed for constipation.   meclizine 25 MG tablet Commonly known as: ANTIVERT Take 25 mg by mouth 3 (three) times daily as needed for dizziness.   nitroGLYCERIN 0.4 MG SL tablet Commonly known as: NITROSTAT Place 1 tablet (0.4 mg total) under the tongue every 5 (five) minutes as needed for chest pain. PLACE ONE TABLET UNDER THE TONGUE EVERY 5 MINUTES FOR 3 DOSES AS NEEDED Strength: 0.4 mg   ondansetron 4 MG tablet Commonly known as: ZOFRAN TAKE ONE TABLET (4MG TOTAL) BY MOUTH EVERY 8 HOURS AS NEEDED FOR NAUSEA OR VOMITING What changed:  how much to take how to take this when to take this reasons to take this additional instructions   pantoprazole 40 MG tablet Commonly known as: PROTONIX TAKE ONE TABLET (40MG TOTAL) BY MOUTH TWO TIMES DAILY BEFORE A MEAL What changed: See the new instructions.   pravastatin 40 MG tablet Commonly known as: PRAVACHOL Take 1 tablet (40 mg total) by mouth every evening.   True Metrix Blood Glucose Test test strip Generic drug: glucose blood USE TO TEST BLOOD SUGAR UP TO TWICE DAILY OR AS DIRECTED         Signed: [name] 06/12/2022, 8:54 AM

## 2022-06-12 NOTE — Progress Notes (Signed)
Pt stated nausea has not decreased since last dose of zofran at 0213. Pt asked for gingerale and crackers to see if it would help. Pt tolerated them well but still has nausea. 0630 pt stated it had not been effective and he wanted to talk to doctor about it today. Pt resting no distress noted. This information was passed on to day shift nurse to follow up.

## 2022-06-19 DIAGNOSIS — H2513 Age-related nuclear cataract, bilateral: Secondary | ICD-10-CM | POA: Diagnosis not present

## 2022-06-19 DIAGNOSIS — H04123 Dry eye syndrome of bilateral lacrimal glands: Secondary | ICD-10-CM | POA: Diagnosis not present

## 2022-06-19 DIAGNOSIS — H35013 Changes in retinal vascular appearance, bilateral: Secondary | ICD-10-CM | POA: Diagnosis not present

## 2022-06-19 DIAGNOSIS — H35033 Hypertensive retinopathy, bilateral: Secondary | ICD-10-CM | POA: Diagnosis not present

## 2022-06-19 DIAGNOSIS — H402231 Chronic angle-closure glaucoma, bilateral, mild stage: Secondary | ICD-10-CM | POA: Diagnosis not present

## 2022-06-19 DIAGNOSIS — H16223 Keratoconjunctivitis sicca, not specified as Sjogren's, bilateral: Secondary | ICD-10-CM | POA: Diagnosis not present

## 2022-06-21 ENCOUNTER — Telehealth: Payer: Self-pay | Admitting: Physician Assistant

## 2022-06-21 NOTE — Telephone Encounter (Signed)
Currently taking Imdur 60 mg in the am

## 2022-06-21 NOTE — Telephone Encounter (Signed)
Pt c/o medication issue:  1. Name of Medication: Isosorbide  2. How are you currently taking this medication (dosage and times per day)? 1 time a day  3. Are you having a reaction (difficulty breathing--STAT)?   4. What is your medication issue?  He wants to know if he needs to take the medicine 2 times a day?  He says in the morning after he takes his pill he feels good. In the evening sometimes  his chest starts hurting. He is wondering if he should or could take another pill in the evening?

## 2022-06-24 NOTE — Telephone Encounter (Signed)
Can change imdur to 94m bid  JZandra AbtsMD

## 2022-06-24 NOTE — Telephone Encounter (Signed)
Left message to return call 

## 2022-06-26 MED ORDER — ISOSORBIDE MONONITRATE ER 60 MG PO TB24
60.0000 mg | ORAL_TABLET | Freq: Two times a day (BID) | ORAL | 3 refills | Status: DC
Start: 2022-06-26 — End: 2022-09-16

## 2022-06-26 NOTE — Telephone Encounter (Signed)
Pt returning call. Transferred to Barbarann Ehlers, RN.

## 2022-06-26 NOTE — Telephone Encounter (Signed)
I spoke with [name]. I told him Dr.Branch stated he could increase Imdur 60 mg to bid. New script sent to [facility] pharmacy

## 2022-06-26 NOTE — Telephone Encounter (Signed)
Left message to return call 

## 2022-06-27 DIAGNOSIS — E1129 Type 2 diabetes mellitus with other diabetic kidney complication: Secondary | ICD-10-CM | POA: Diagnosis not present

## 2022-06-27 DIAGNOSIS — I129 Hypertensive chronic kidney disease with stage 1 through stage 4 chronic kidney disease, or unspecified chronic kidney disease: Secondary | ICD-10-CM | POA: Diagnosis not present

## 2022-06-27 DIAGNOSIS — E1122 Type 2 diabetes mellitus with diabetic chronic kidney disease: Secondary | ICD-10-CM | POA: Diagnosis not present

## 2022-06-27 DIAGNOSIS — R809 Proteinuria, unspecified: Secondary | ICD-10-CM | POA: Diagnosis not present

## 2022-06-27 DIAGNOSIS — N189 Chronic kidney disease, unspecified: Secondary | ICD-10-CM | POA: Diagnosis not present

## 2022-07-03 DIAGNOSIS — R809 Proteinuria, unspecified: Secondary | ICD-10-CM | POA: Diagnosis not present

## 2022-07-03 DIAGNOSIS — D638 Anemia in other chronic diseases classified elsewhere: Secondary | ICD-10-CM | POA: Diagnosis not present

## 2022-07-03 DIAGNOSIS — N189 Chronic kidney disease, unspecified: Secondary | ICD-10-CM | POA: Diagnosis not present

## 2022-07-03 DIAGNOSIS — I129 Hypertensive chronic kidney disease with stage 1 through stage 4 chronic kidney disease, or unspecified chronic kidney disease: Secondary | ICD-10-CM | POA: Diagnosis not present

## 2022-07-03 DIAGNOSIS — E1129 Type 2 diabetes mellitus with other diabetic kidney complication: Secondary | ICD-10-CM | POA: Diagnosis not present

## 2022-07-03 DIAGNOSIS — E211 Secondary hyperparathyroidism, not elsewhere classified: Secondary | ICD-10-CM | POA: Diagnosis not present

## 2022-07-03 DIAGNOSIS — I5032 Chronic diastolic (congestive) heart failure: Secondary | ICD-10-CM | POA: Diagnosis not present

## 2022-07-03 DIAGNOSIS — E1122 Type 2 diabetes mellitus with diabetic chronic kidney disease: Secondary | ICD-10-CM | POA: Diagnosis not present

## 2022-07-10 DIAGNOSIS — A881 Epidemic vertigo: Secondary | ICD-10-CM | POA: Diagnosis not present

## 2022-07-10 DIAGNOSIS — L089 Local infection of the skin and subcutaneous tissue, unspecified: Secondary | ICD-10-CM | POA: Diagnosis not present

## 2022-07-10 DIAGNOSIS — S80819A Abrasion, unspecified lower leg, initial encounter: Secondary | ICD-10-CM | POA: Diagnosis not present

## 2022-07-11 ENCOUNTER — Ambulatory Visit (HOSPITAL_COMMUNITY): Payer: Medicare Other | Attending: Family Medicine | Admitting: Physical Therapy

## 2022-07-11 ENCOUNTER — Encounter (HOSPITAL_COMMUNITY): Payer: Self-pay | Admitting: Physical Therapy

## 2022-07-11 DIAGNOSIS — S80819A Abrasion, unspecified lower leg, initial encounter: Secondary | ICD-10-CM | POA: Diagnosis not present

## 2022-07-11 DIAGNOSIS — R2689 Other abnormalities of gait and mobility: Secondary | ICD-10-CM | POA: Diagnosis not present

## 2022-07-11 DIAGNOSIS — L089 Local infection of the skin and subcutaneous tissue, unspecified: Secondary | ICD-10-CM | POA: Diagnosis not present

## 2022-07-11 NOTE — Therapy (Signed)
[place], [place], [address] Phone: [phone]   Fax:  [phone]  Wound Care Evaluation  Patient Details  Name: [name] MRN: [id number] Date of Birth: 12/16/1946 Referring Provider (PT): Sharilyn Sites MD   Encounter Date: 07/11/2022   PT End of Session - 07/11/22 1502     Visit Number 1    Number of Visits 1    Date for PT Re-Evaluation 07/11/22    Authorization Type [employer] Medicare    PT Start Time 1421    PT Stop Time 1452    PT Time Calculation (min) 31 min    Activity Tolerance Patient tolerated treatment well    Behavior During Therapy [facility] for tasks assessed/performed             Past Medical History:  Diagnosis Date   Anginal pain ([place])    Anxiety    Benign prostatic hypertrophy    Nocturia   CAD (coronary artery disease)    a.  NSTEMI 10/2012 s/p DES to LAD & DES to 1st diagonal with residual diffuse nonobstructive dz in LCx/RCA    Chronic kidney disease, stage 3, mod decreased GFR (HCC)    Creatinine of 1.5 in 03/2009, STAGE 4 now   Degenerative joint disease    Depression    hx of   Diabetes mellitus, type II ([place]) 20/25/4270   Diastolic heart failure    Pedal edema, "a long time ago"   Erectile dysfunction    Flu 2013   hx of   GERD (gastroesophageal reflux disease)    Gout    Hemorrhoids    High cholesterol    Hypertension    Exercise induced   Myocardial infarction ([place])    Neuropathy    bilateral legs   NSVT (nonsustained ventricular tachycardia) ([place])    Exercise induced   Obesity    Tobacco abuse, 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, quit 1994   Tubular adenoma 2014   Vertigo    everyday    Past Surgical History:  Procedure Laterality Date   BACK SURGERY  2007   x2   BIOPSY  02/05/2021   Procedure: BIOPSY;  Surgeon: Daneil Dolin, MD;  [facility];  Service: Endoscopy;;   CARDIAC CATHETERIZATION     with stent placement   COLONOSCOPY WITH  ESOPHAGOGASTRODUODENOSCOPY (EGD) N/A 11/23/2013   Dr. Oneida Alar- TCS= left sided colitis and mild proctitis likely due to ischemic colitis, moderate internal hemorrhoids, small nodule in the rectum, bx=tubular adenoma, EGD=-mild non-erosive gastritis   COLONOSCOPY WITH PROPOFOL N/A 07/24/2020   hemorrhoids,  rectosigmoid lipoma, small adenoma removed.   CORONARY ANGIOPLASTY     ESOPHAGOGASTRODUODENOSCOPY (EGD) WITH PROPOFOL N/A 02/05/2021   Normal esophagus, erythematous mucosa in entire stomach, no obvious ulcer, s/p biopsy. Normal duodenum. Negative H.pylori.    HERNIA REPAIR     JOINT REPLACEMENT     LEFT HEART CATHETERIZATION WITH CORONARY ANGIOGRAM N/A 11/16/2012   Procedure: LEFT HEART CATHETERIZATION WITH CORONARY ANGIOGRAM;  Surgeon: Sherren Mocha, MD;  Location: [facility] CATH LAB;  Service: Cardiovascular;  Laterality: N/A;   PERCUTANEOUS CORONARY STENT INTERVENTION (PCI-S)  11/16/2012   Procedure: PERCUTANEOUS CORONARY STENT INTERVENTION (PCI-S);  Surgeon: Sherren Mocha, MD;  Location: [facility] CATH LAB;  Service: Cardiovascular;;   POLYPECTOMY  07/24/2020   Procedure: POLYPECTOMY;  Surgeon: Daneil Dolin, MD;  Location: [facility];  Service: Endoscopy;;   SPINAL CORD STIMULATOR INSERTION N/A 06/10/2022  Procedure: Spinal cord stimulator placement;  Surgeon: Newman Pies, MD;  Location: [facility];  Service: Neurosurgery;  Laterality: N/A;  RM 19/ to follow   THUMB AMPUTATION Left    50 years ago   TOTAL KNEE ARTHROPLASTY Right 11/15/2013   Procedure: RIGHT TOTAL KNEE ARTHROPLASTY;  Surgeon: Mauri Pole, MD;  Location: [facility] OR[facility];  Service: Orthopedics;  Laterality: Right;   TOTAL KNEE ARTHROPLASTY Left 03/07/2015   Procedure: LEFT TOTAL KNEE ARTHROPLASTY;  Surgeon: Paralee Cancel, MD;  Location: [facility] OR[facility];  Service: Orthopedics;  Laterality: Left;    There were no vitals filed for this visit.     [facility] PT Assessment - 07/11/22 0001       Assessment   Medical Diagnosis leg infection DM     Referring Provider (PT) Sharilyn Sites MD    Onset Date/Surgical Date 06/27/22             Wound Therapy - 07/11/22 0001     Subjective Patient states wound started about 2 weeks ago. He has compression garments at home but doesnt always wear them. His MD gave him a shot yesterday in his leg and the wound is looking better today.    Patient and Family Stated Goals wound to heal    Date of Onset 06/27/22    Pain Scale 0-10    Pain Score 0-No pain    Evaluation and Treatment Procedures Explained to Patient/Family Yes    Evaluation and Treatment Procedures agreed to    Wound Properties Date First Assessed: 07/11/22 Time First Assessed: 1430 Wound Type: Venous stasis ulcer Location: Pretibial Location Orientation: Left Wound Description (Comments): L anterior distal leg wound Present on Admission: Yes   Wound Image Images linked: 1    Dressing Type None    Dressing Changed Changed    Dressing Change Frequency PRN    Site / Wound Assessment Yellow;Red;Pink    % Wound base Red or Granulating 100%    Wound Length (cm) 2.5 cm    Wound Width (cm) 2.5 cm    Wound Surface Area (cm^2) 6.25 cm^2    Margins Attached edges (approximated)    Drainage Amount Minimal    Drainage Description Serous    Treatment Cleansed;Debridement (Selective)    Selective Debridement - Location wound bed    Selective Debridement - Tools Used Forceps    Selective Debridement - Tissue Removed slough/scab    Wound Therapy - Clinical Statement Patient arrives without dressing on LE wound. Patient with bilateral LE edema without compression garments. Patient educated on importance of compression garments to reduce edema and risk of furhter wound development. Patient states he will try to find some thinner ones as he does not want to wear them because it is hot outside. Debrided scab/slough from wound bed. Patient not wishing to pursue further wound care intervention besides dressing today as he states he "can get another  shot in it to heal" from MD if needed. Dressed wound with alginate followed by 2x2s and kerlix secured by tape. Educated patient on returning to PT with new referral for further wound care if wishing to pursue treatment. Again, stressed importance of compression garments.    Wound Therapy - Functional Problem List ambulating    Factors Delaying/Impairing Wound Healing Other (comment);Diabetes Mellitus;Altered sensation;Multiple medical problems;Vascular compromise   CAD, CKD, DM, HTN hx MI, HLD, Obesity, neuropathy, chronic LBP   Hydrotherapy Plan Debridement;Patient/family education;Electrical stimulation;Dressing change;Pulsatile lavage with suction;Ultrasonic wound therapy @35  KHz (+/- 3 KHz)  Wound Therapy - Frequency --   one time visit   Wound Therapy - Current Recommendations PT    Dressing  alginate, 2x2, kerlix, tape                   Objective measurements completed on examination: See above findings.             PT Education - 07/11/22 1503     Education Details exam findings, importance of compression garments    Person(s) Educated Patient    Methods Explanation    Comprehension Verbalized understanding              PT Short Term Goals - 07/11/22 1503       PT SHORT TERM GOAL #1   Title Patient will be educated on imporance of wearing comrpession garments.    Time 1    Period Days    Status Achieved    Target Date 07/11/22                      Patient will benefit from skilled therapeutic intervention in order to improve the following deficits and impairments:     Visit Diagnosis: Other abnormalities of gait and mobility  Abrasion of lower extremity with infection, unspecified laterality, initial encounter    Problem List Patient Active Problem List   Diagnosis Date Noted   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/2022   Diabetic neuropathy ([place]) 05/10/2022   Elevated troponin 05/10/2022   Type 2 diabetes mellitus  with hyperglycemia ([place]) 05/10/2022   Other intervertebral disc degeneration, lumbar region 02/06/2022   Chronic gout, unspecified, 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ophi) 02/06/2022   Body mass index (BMI) 35.0-35.9, adult 02/06/2022   Obesity 02/06/2022   Primary generalized (osteo)arthritis 02/06/2022   Chest pain 02/06/2022   Polyneuropathy 10/29/2021   Bilateral lower extremity edema 08/28/2021   Other chronic pain 08/28/2021   Lumbago with sciatica, right side 08/28/2021   Weakness of both lower extremities 08/28/2021   Body mass index (BMI) 33.0-33.9, adult 08/28/2021   Constipation 02/15/2021   Abdominal pain 01/02/2021   Diabetes mellitus ([place]) 11/09/2020   Vertigo 11/09/2020   Hypertensive disorder 11/09/2020   Hypercholesterolemia 11/09/2020   H/O adenomatous polyp of colon 05/05/2020   Iron deficiency anemia 05/05/2020   Chronic kidney disease, stage IV (severe) ([place]) 12/14/2019   Acute gouty arthritis 10/25/2019   Sepsis ([place]) 04/02/2019   Leukocytosis 04/02/2019   Cellulitis of right hand 04/01/2019   Osteoarthritis of ankle and foot 02/08/2019   Peripheral edema    CHF (congestive heart failure) ([place]) 07/05/2015   Acute congestive heart failure ([place]) 07/04/2015   Acute on chronic diastolic congestive heart failure ([place]) 07/04/2015   Mixed hyperlipidemia 07/04/2015   Anxiety state 07/04/2015   GERD (gastroesophageal reflux disease) 07/04/2015   Heart block 07/04/2015   Gastroesophageal reflux disease 07/04/2015   Edema 16/09/9603   Diastolic CHF, chronic (HCC) 03/11/2015   Chronic diastolic heart failure ([place]) 03/11/2015   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   Muscle weakness (generalized) 01/13/2014   Knee stiffness 01/13/2014   Gout 12/11/2013   Cellulitis 11/19/2013   Expected blood loss anemia 11/17/2013   Obese 11/17/2013   S/P right TKA 11/15/2013   Anemia, normocytic normochromic 01/12/2013   Normocytic normochromic anemia 01/12/2013   Acute kidney injury superimposed on chronic  kidney disease ([place]) 12/10/2012   Diabetes mellitus type 2 in obese ([place]) 12/10/2012   CAD S/P percutaneous coronary angioplasty 11/24/2012  Overweight(278.02) 11/17/2012   Obstructive sleep apnea 11/15/2012   Chronic kidney disease, stage 3, cardiorenal syndrome 08/23/2011   Chronic kidney disease, stage 3 unspecified ([place]) 08/23/2011   Essential hypertension     Mearl Latin, PT 07/11/2022, 3:19 PM  [place] [address] [place], [place], [address] Phone: [phone]   Fax:  [phone]  Name: [name] MRN: [id number] Date of Birth: 05-23-1946

## 2022-07-12 DIAGNOSIS — L089 Local infection of the skin and subcutaneous tissue, unspecified: Secondary | ICD-10-CM | POA: Diagnosis not present

## 2022-07-12 DIAGNOSIS — S80819A Abrasion, unspecified lower leg, initial encounter: Secondary | ICD-10-CM | POA: Diagnosis not present

## 2022-07-16 DIAGNOSIS — S80819A Abrasion, unspecified lower leg, initial encounter: Secondary | ICD-10-CM | POA: Diagnosis not present

## 2022-07-17 ENCOUNTER — Encounter (HOSPITAL_COMMUNITY): Payer: Self-pay | Admitting: Hematology

## 2022-07-17 ENCOUNTER — Inpatient Hospital Stay (HOSPITAL_COMMUNITY): Payer: Medicare Other | Attending: Hematology | Admitting: Hematology

## 2022-07-17 ENCOUNTER — Inpatient Hospital Stay (HOSPITAL_COMMUNITY): Payer: Medicare Other

## 2022-07-17 VITALS — BP 132/47 | HR 54 | Temp 97.9°F | Resp 18 | Ht 74.5 in | Wt 273.0 lb

## 2022-07-17 DIAGNOSIS — E119 Type 2 diabetes mellitus without complications: Secondary | ICD-10-CM | POA: Diagnosis not present

## 2022-07-17 DIAGNOSIS — Z87891 Personal history of nicotine dependence: Secondary | ICD-10-CM | POA: Insufficient documentation

## 2022-07-17 DIAGNOSIS — Z808 Family history of malignant neoplasm of other organs or systems: Secondary | ICD-10-CM | POA: Diagnosis not present

## 2022-07-17 DIAGNOSIS — D649 Anemia, unspecified: Secondary | ICD-10-CM

## 2022-07-17 DIAGNOSIS — E538 Deficiency of other specified B group vitamins: Secondary | ICD-10-CM

## 2022-07-17 DIAGNOSIS — I252 Old myocardial infarction: Secondary | ICD-10-CM | POA: Insufficient documentation

## 2022-07-17 DIAGNOSIS — K5900 Constipation, unspecified: Secondary | ICD-10-CM | POA: Insufficient documentation

## 2022-07-17 DIAGNOSIS — I10 Essential (primary) hypertension: Secondary | ICD-10-CM | POA: Diagnosis not present

## 2022-07-17 LAB — CBC WITH DIFFERENTIAL/PLATELET
Abs Immature Granulocytes: 0.01 10*3/uL (ref 0.00–0.07)
Basophils Absolute: 0 10*3/uL (ref 0.0–0.1)
Basophils Relative: 0 %
Eosinophils Absolute: 0.6 10*3/uL — ABNORMAL HIGH (ref 0.0–0.5)
Eosinophils Relative: 10 %
HCT: 25.9 % — ABNORMAL LOW (ref 39.0–52.0)
Hemoglobin: 8.4 g/dL — ABNORMAL LOW (ref 13.0–17.0)
Immature Granulocytes: 0 %
Lymphocytes Relative: 14 %
Lymphs Abs: 1 10*3/uL (ref 0.7–4.0)
MCH: 32.2 pg (ref 26.0–34.0)
MCHC: 32.4 g/dL (ref 30.0–36.0)
MCV: 99.2 fL (ref 80.0–100.0)
Monocytes Absolute: 1.2 10*3/uL — ABNORMAL HIGH (ref 0.1–1.0)
Monocytes Relative: 18 %
Neutro Abs: 3.9 10*3/uL (ref 1.7–7.7)
Neutrophils Relative %: 58 %
Platelets: 206 10*3/uL (ref 150–400)
RBC: 2.61 MIL/uL — ABNORMAL LOW (ref 4.22–5.81)
RDW: 16.5 % — ABNORMAL HIGH (ref 11.5–15.5)
WBC: 6.7 10*3/uL (ref 4.0–10.5)
nRBC: 0 % (ref 0.0–0.2)

## 2022-07-17 LAB — VITAMIN B12: Vitamin B-12: 118 pg/mL — ABNORMAL LOW (ref 180–914)

## 2022-07-17 LAB — IRON AND TIBC
Iron: 17 ug/dL — ABNORMAL LOW (ref 45–182)
Saturation Ratios: 8 % — ABNORMAL LOW (ref 17.9–39.5)
TIBC: 205 ug/dL — ABNORMAL LOW (ref 250–450)
UIBC: 188 ug/dL

## 2022-07-17 LAB — RETICULOCYTES
Immature Retic Fract: 12 % (ref 2.3–15.9)
RBC.: 2.7 MIL/uL — ABNORMAL LOW (ref 4.22–5.81)
Retic Count, Absolute: 45.9 10*3/uL (ref 19.0–186.0)
Retic Ct Pct: 1.7 % (ref 0.4–3.1)

## 2022-07-17 LAB — FERRITIN: Ferritin: 352 ng/mL — ABNORMAL HIGH (ref 24–336)

## 2022-07-17 LAB — LACTATE DEHYDROGENASE: LDH: 160 U/L (ref 98–192)

## 2022-07-17 LAB — FOLATE: Folate: 9.1 ng/mL (ref >5.9)

## 2022-07-17 NOTE — Progress Notes (Signed)
[place] [address], [place] [address]   CLINIC:  Medical Oncology/Hematology  Patient Care Team: Redmond School, MD as PCP - General (Internal Medicine) Harl Bowie, Alphonse Guild, MD as PCP - Cardiology (Cardiology) Dalton-Bethea, Fabio Asa, MD (Physical Medicine and Rehabilitation) Paralee Cancel, MD as Consulting Physician (Orthopedic Surgery) Pieter Partridge, DO as Consulting Physician (Neurology) Gala Romney Cristopher Estimable, MD as Consulting Physician (Gastroenterology)  CHIEF COMPLAINTS/PURPOSE OF CONSULTATION:  Evaluation for anemia  HISTORY OF PRESENTING ILLNESS:  [name] 76 y.o. male is here because of evaluation for anemia, at the request of Dr. Theador Hawthorne.  Today he reports feeling good. He reports a history of back pain. He denies history of blood transfusions or iron infusions. He started 1 iron tablet daily 1 year ago. He reports he has been intentionally dieting for 1 year, and he has lost 30 lbs. He reports occasional CP for which he takes nitroglycerin. He reports constipation which was not helped by Miralax. He also reports a history of vertigo. 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 He denies history of CVA. 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 He reports a history of swelling and cellulitis in his legs bilaterally.   He lives at home on his own, and he is able to do his typical daily activities including driving. 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 He quit smoking April 2022 after smoking 1 ppd for 20 years. He denies family history of anemia, thalassemia, and sickle cell disease. His father had brain cancer, but he otherwise denies family history of cancer.   MEDICAL HISTORY:  Past Medical History:  Diagnosis Date   Anginal pain ([place])    Anxiety    Benign prostatic hypertrophy    Nocturia   CAD (coronary artery disease)    a.  NSTEMI 10/2012 s/p DES to LAD & DES to 1st diagonal with residual diffuse  nonobstructive dz in LCx/RCA    Chronic kidney disease, stage 3, mod decreased GFR (HCC)    Creatinine of 1.5 in 03/2009, STAGE 4 now   Degenerative joint disease    Depression    hx of   Diabetes mellitus, type II ([place]) 09/32/3557   Diastolic heart failure    Pedal edema, "a long time ago"   Erectile dysfunction    Flu 2013   hx of   GERD (gastroesophageal reflux disease)    Gout    Hemorrhoids    High cholesterol    Hypertension    Exercise induced   Myocardial infarction ([place])    Neuropathy    bilateral legs   NSVT (nonsustained ventricular tachycardia) ([place])    Exercise induced   Obesity    Tobacco abuse, 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, quit 1994   Tubular adenoma 2014   Vertigo    everyday    SURGICAL HISTORY: Past Surgical History:  Procedure Laterality Date   BACK SURGERY  2007   x2   BIOPSY  02/05/2021   Procedure: BIOPSY;  Surgeon: Daneil Dolin, MD;  [facility];  Service: Endoscopy;;   CARDIAC CATHETERIZATION     with stent placement   COLONOSCOPY WITH ESOPHAGOGASTRODUODENOSCOPY (EGD) N/A 11/23/2013   Dr. Oneida Alar- TCS= left sided colitis and mild proctitis likely due to ischemic colitis, moderate internal hemorrhoids, small nodule in the rectum, bx=tubular adenoma, EGD=-mild non-erosive gastritis   COLONOSCOPY WITH PROPOFOL N/A 07/24/2020   hemorrhoids,  rectosigmoid lipoma,  small adenoma removed.   CORONARY ANGIOPLASTY     ESOPHAGOGASTRODUODENOSCOPY (EGD) WITH PROPOFOL N/A 02/05/2021   Normal esophagus, erythematous mucosa in entire stomach, no obvious ulcer, s/p biopsy. Normal duodenum. Negative H.pylori.    HERNIA REPAIR     JOINT REPLACEMENT     LEFT HEART CATHETERIZATION WITH CORONARY ANGIOGRAM N/A 11/16/2012   Procedure: LEFT HEART CATHETERIZATION WITH CORONARY ANGIOGRAM;  Surgeon: Sherren Mocha, MD;  Location: [facility] CATH LAB;  Service: Cardiovascular;  Laterality: N/A;   PERCUTANEOUS CORONARY STENT INTERVENTION (PCI-S)  11/16/2012    Procedure: PERCUTANEOUS CORONARY STENT INTERVENTION (PCI-S);  Surgeon: Sherren Mocha, MD;  Location: [facility] CATH LAB;  Service: Cardiovascular;;   POLYPECTOMY  07/24/2020   Procedure: POLYPECTOMY;  Surgeon: Daneil Dolin, MD;  [facility];  Service: Endoscopy;;   SPINAL CORD STIMULATOR INSERTION N/A 06/10/2022   Procedure: Spinal cord stimulator placement;  Surgeon: Newman Pies, MD;  Location: [facility];  Service: Neurosurgery;  Laterality: N/A;  RM 19/ to follow   THUMB AMPUTATION Left    50 years ago   TOTAL KNEE ARTHROPLASTY Right 11/15/2013   Procedure: RIGHT TOTAL KNEE ARTHROPLASTY;  Surgeon: Mauri Pole, MD;  [facility]: [facility];  Service: Orthopedics;  Laterality: Right;   TOTAL KNEE ARTHROPLASTY Left 03/07/2015   Procedure: LEFT TOTAL KNEE ARTHROPLASTY;  Surgeon: Paralee Cancel, MD;  [facility];  Service: Orthopedics;  Laterality: Left;    SOCIAL HISTORY: Social History   Socioeconomic History   Marital status: Widowed    Spouse name: Not on file   Number of children: Not on file   Years of education: Not on file   Highest education level: Not on file  Occupational History   Occupation: Disabled    Employer: RETIRED  Tobacco Use   Smoking status: Former    Packs/day: 1.00    Years: 30.00    Total pack years: 30.00    Types: Cigarettes    Start date: 01/22/1965    Quit date: 12/23/1992    Years since quitting: 29.5   Smokeless tobacco: Never  Vaping Use   Vaping Use: Never used  Substance and Sexual Activity   Alcohol use: No    Alcohol/week: 0.0 standard drinks of alcohol   Drug use: No   Sexual activity: Not Currently  Other Topics Concern   Not on file  Social History Narrative   Married   Right handed   One story home no caffeine   Social Determinants of Health   Financial Resource Strain: Not on file  Food Insecurity: Not on file  Transportation Needs: Not on file  Physical Activity: Not on file  Stress: Not on file  Social Connections:  Not on file  Intimate Partner Violence: Not on file    FAMILY HISTORY: Family History  Problem Relation Age of Onset   Hypertension Other    Cancer Other    Heart disease Other    Diabetes Other    Hypertension Mother    Cancer Father        brain   CAD Brother    Colon cancer Neg Hx     ALLERGIES:  is allergic to metformin, niacin, and niacin and related.  MEDICATIONS:  Current Outpatient Medications  Medication Sig Dispense Refill   acarbose (PRECOSE) 50 MG tablet Take 50 mg by mouth in the morning, at noon, and at bedtime.      allopurinol (ZYLOPRIM) 100 MG tablet Take 100 mg by mouth daily.  amLODipine (NORVASC) 10 MG tablet Take 10 mg by mouth every evening.      aspirin EC 81 MG tablet Take 1 tablet (81 mg total) by mouth daily. 90 tablet 3   calcitRIOL (ROCALTROL) 0.25 MCG capsule Take 0.25 mcg by mouth every Sunday.      carboxymethylcellulose (REFRESH PLUS) 0.5 % SOLN Place 1 drop into both eyes daily as needed (dry eyes).     carvedilol (COREG) 6.25 MG tablet Take 6.25 mg by mouth 2 (two) times daily with a meal.     cholecalciferol (VITAMIN D) 1000 units tablet Take 1,000 Units daily by mouth.     cloNIDine (CATAPRES) 0.1 MG tablet Take 0.1 mg by mouth 3 (three) times daily.     colchicine 0.6 MG tablet      dicyclomine (BENTYL) 10 MG capsule      diphenoxylate-atropine (LOMOTIL) 2.5-0.025 MG tablet Take 2 tablets by mouth 4 (four) times daily as needed.     epoetin alfa (EPOGEN) 3000 UNIT/ML injection Inject into the skin.     erythromycin ophthalmic ointment APPLY 1 CM RIBBON INTO THE LOWER CONJUNCTIVAL SAC(S) IN THE AFFECTED EYE(S) BY OPHTHALMIC ROUTE 3 TIMES PER DAY FOR ONE WEEK     furosemide (LASIX) 40 MG tablet Take 1 tablet (40 mg total) by mouth daily. (Patient taking differently: Take 40 mg by mouth 2 (two) times daily.) 30 tablet 2   gabapentin (NEURONTIN) 300 MG capsule Take 300 mg by mouth 3 (three) times daily.      hydrALAZINE (APRESOLINE) 100 MG  tablet Take 200 mg by mouth 2 (two) times daily.     HYDROcodone-acetaminophen (NORCO) 10-325 MG tablet Take 1-2 tablets by mouth 4 (four) times daily as needed for pain.     insulin NPH Human (NOVOLIN N) 100 UNIT/ML injection Inject 0.18 mLs (18 Units total) into the skin every morning. Ands syringes 1/day (Patient taking differently: Inject 20 Units into the skin daily as needed (if CBG is high). Per sliding scale: not provided) 10 mL 11   isosorbide mononitrate (IMDUR) 60 MG 24 hr tablet Take 1 tablet (60 mg total) by mouth 2 (two) times daily. 180 tablet 3   lactulose (CONSTULOSE) 10 GM/15ML solution      LORazepam (ATIVAN) 0.5 MG tablet      losartan (COZAAR) 25 MG tablet      lubiprostone (AMITIZA) 8 MCG capsule Take 8 mcg by mouth daily as needed for constipation.     meclizine (ANTIVERT) 25 MG tablet Take 25 mg by mouth 3 (three) times daily as needed for dizziness.     mupirocin ointment (BACTROBAN) 2 % SMARTSIG:sparingly Topical 3 Times Daily     nitroGLYCERIN (NITROSTAT) 0.4 MG SL tablet Place 1 tablet (0.4 mg total) under the tongue every 5 (five) minutes as needed for chest pain. PLACE ONE TABLET UNDER THE TONGUE EVERY 5 MINUTES FOR 3 DOSES AS NEEDED Strength: 0.4 mg 25 tablet 3   pantoprazole (PROTONIX) 40 MG tablet TAKE ONE TABLET (40MG TOTAL) BY MOUTH TWO TIMES DAILY BEFORE A MEAL (Patient taking differently: Take 40 mg by mouth 2 (two) times daily.) 60 tablet 5   pravastatin (PRAVACHOL) 40 MG tablet Take 1 tablet (40 mg total) by mouth every evening. 90 tablet 3   RESTASIS 0.05 % ophthalmic emulsion 1 drop 2 (two) times daily.     silver sulfADIAZINE (SSD) 1 % cream      sitaGLIPtin (JANUVIA) 25 MG tablet      tiZANidine (ZANAFLEX)  4 MG tablet      TRUE METRIX BLOOD GLUCOSE TEST test strip USE TO TEST BLOOD SUGAR UP TO TWICE DAILY OR AS DIRECTED 180 each 3   ondansetron (ZOFRAN) 4 MG tablet TAKE ONE TABLET (4MG TOTAL) BY MOUTH EVERY 8 HOURS AS NEEDED FOR NAUSEA OR VOMITING (Patient  not taking: Reported on 07/17/2022) 30 tablet 3   No current facility-administered medications for this visit.    REVIEW OF SYSTEMS:   Review of Systems  Constitutional:  Negative for appetite change, fatigue and unexpected weight change (-30 lbs).  HENT:   Positive for trouble swallowing.   Cardiovascular:  Positive for chest pain.  Gastrointestinal:  Positive for constipation and nausea.  Musculoskeletal:  Positive for back pain (8/10).  Neurological:  Positive for dizziness.  All other systems reviewed and are negative.    PHYSICAL EXAMINATION: ECOG PERFORMANCE STATUS: 1 - Symptomatic but completely ambulatory  Vitals:   07/17/22 1333  BP: (!) 132/47  Pulse: (!) 54  Resp: 18  Temp: 97.9 F (36.6 C)  SpO2: 100%   Filed Weights   07/17/22 1333  Weight: 273 lb (123.8 kg)   Physical Exam Vitals reviewed.  Constitutional:      Appearance: Normal appearance. He is obese.     Comments: In wheelchair  Cardiovascular:     Rate and Rhythm: Normal rate and regular rhythm.     Pulses: Normal pulses.     Heart sounds: Normal heart sounds.  Pulmonary:     Effort: Pulmonary effort is normal.     Breath sounds: Normal breath sounds.  Musculoskeletal:     Comments: Chronic venous stasis changes in the legs bilaterally  Neurological:     General: No focal deficit present.     Mental Status: He is alert and oriented to person, place, and time.  Psychiatric:        Mood and Affect: Mood normal.        Behavior: Behavior normal.      LABORATORY DATA:  I have reviewed the data as listed Recent Results (from the past 2160 hour(s))  NM Myocar Multi W/Spect W/Wall Motion / EF     Status: None   Collection Time: 05/06/22 12:00 PM  Result Value Ref Range   Rest HR 65.0 bpm   Rest BP 135/66 mmHg   Peak HR 87 bpm   Peak BP 198/71 mmHg   ST Depression (mm) 0 mm   LV sys vol 103.0 mL   LV dias vol 222.0 62 - 150 mL   Nuc Stress EF 55 %  Basic metabolic panel     Status:  Abnormal   Collection Time: 05/09/22  7:58 PM  Result Value Ref Range   Sodium 135 135 - 145 mmol/L   Potassium 4.0 3.5 - 5.1 mmol/L   Chloride 101 98 - 111 mmol/L   CO2 23 22 - 32 mmol/L   Glucose, Bld 166 (H) 70 - 99 mg/dL    Comment: Glucose reference range applies only to samples taken after fasting for at least 8 hours.   BUN 80 (H) 8 - 23 mg/dL   Creatinine, Ser 4.12 (H) 0.61 - 1.24 mg/dL   Calcium 9.0 8.9 - 10.3 mg/dL   GFR, Estimated 14 (L) >60 mL/min    Comment: (NOTE) Calculated using the CKD-EPI Creatinine Equation (2021)    Anion gap 11 5 - 15    Comment: Performed at [facility], [address], [place], [place] [address]  CBC     Status: Abnormal   Collection Time: 05/09/22  7:58 PM  Result Value Ref Range   WBC 5.4 4.0 - 10.5 K/uL   RBC 2.67 (L) 4.22 - 5.81 MIL/uL   Hemoglobin 8.7 (L) 13.0 - 17.0 g/dL   HCT 26.4 (L) 39.0 - 52.0 %   MCV 98.9 80.0 - 100.0 fL   MCH 32.6 26.0 - 34.0 pg   MCHC 33.0 30.0 - 36.0 g/dL   RDW 13.5 11.5 - 15.5 %   Platelets 192 150 - 400 K/uL   nRBC 0.0 0.0 - 0.2 %    Comment: Performed at [facility], [address], [place], [place] [address]  Troponin I (High Sensitivity)     Status: Abnormal   Collection Time: 05/09/22  7:58 PM  Result Value Ref Range   Troponin I (High Sensitivity) 25 (H) <18 ng/L    Comment: (NOTE) Elevated high sensitivity troponin I (hsTnI) values and significant  changes across serial measurements may suggest ACS but many other  chronic and acute conditions are known to elevate hsTnI results.  Refer to the "Links" section for chest pain algorithms and additional  guidance. Performed at [facility], [address], [place], [place] [address]   Troponin I (High Sensitivity)     Status: Abnormal   Collection Time: 05/09/22 10:06 PM  Result Value Ref Range   Troponin I (High Sensitivity) 32 (H) <18 ng/L    Comment: (NOTE) Elevated high sensitivity troponin I (hsTnI) values and significant  changes across  serial measurements may suggest ACS but many other  chronic and acute conditions are known to elevate hsTnI results.  Refer to the "Links" section for chest pain algorithms and additional  guidance. Performed at [facility], [address] [place], [place] [address]   Comprehensive metabolic panel     Status: Abnormal   Collection Time: 05/10/22  3:40 AM  Result Value Ref Range   Sodium 140 135 - 145 mmol/L   Potassium 3.9 3.5 - 5.1 mmol/L   Chloride 104 98 - 111 mmol/L   CO2 24 22 - 32 mmol/L   Glucose, Bld 126 (H) 70 - 99 mg/dL    Comment: Glucose reference range applies only to samples taken after fasting for at least 8 hours.   BUN 79 (H) 8 - 23 mg/dL   Creatinine, Ser 3.76 (H) 0.61 - 1.24 mg/dL   Calcium 9.2 8.9 - 10.3 mg/dL   Total Protein 7.1 6.5 - 8.1 g/dL   Albumin 3.8 3.5 - 5.0 g/dL   AST 20 15 - 41 U/L   ALT 15 0 - 44 U/L   Alkaline Phosphatase 56 38 - 126 U/L   Total Bilirubin 0.3 0.3 - 1.2 mg/dL   GFR, Estimated 16 (L) >60 mL/min    Comment: (NOTE) Calculated using the CKD-EPI Creatinine Equation (2021)    Anion gap 12 5 - 15    Comment: Performed at [facility], [address] [place], [place] [address]  CBC     Status: Abnormal   Collection Time: 05/10/22  3:40 AM  Result Value Ref Range   WBC 6.6 4.0 - 10.5 K/uL   RBC 2.60 (L) 4.22 - 5.81 MIL/uL   Hemoglobin 8.5 (L) 13.0 - 17.0 g/dL   HCT 26.1 (L) 39.0 - 52.0 %   MCV 100.4 (H) 80.0 - 100.0 fL   MCH 32.7 26.0 - 34.0 pg   MCHC 32.6 30.0 - 36.0 g/dL   RDW  13.8 11.5 - 15.5 %   Platelets 186 150 - 400 K/uL   nRBC 0.0 0.0 - 0.2 %    Comment: Performed at [facility], [address] [place], [place] [address]  APTT     Status: None   Collection Time: 05/10/22  3:40 AM  Result Value Ref Range   aPTT 30 24 - 36 seconds    Comment: Performed at [facility], [address], [place], [place] [address]  Magnesium     Status: None   Collection Time: 05/10/22  3:40 AM  Result Value Ref Range   Magnesium 2.2 1.7  - 2.4 mg/dL    Comment: Performed at [facility], [address], [place], [place] [address]  Phosphorus     Status: None   Collection Time: 05/10/22  3:40 AM  Result Value Ref Range   Phosphorus 4.2 2.5 - 4.6 mg/dL    Comment: Performed at [facility], [address], [place], [place] [address]  Troponin I (High Sensitivity)     Status: Abnormal   Collection Time: 05/10/22  3:40 AM  Result Value Ref Range   Troponin I (High Sensitivity) 39 (H) <18 ng/L    Comment: (NOTE) Elevated high sensitivity troponin I (hsTnI) values and significant  changes across serial measurements may suggest ACS but many other  chronic and acute conditions are known to elevate hsTnI results.  Refer to the "Links" section for chest pain algorithms and additional  guidance. Performed at [facility], [address] [place], [place] [address]   Lipid panel     Status: Abnormal   Collection Time: 05/10/22  3:40 AM  Result Value Ref Range   Cholesterol 99 0 - 200 mg/dL   Triglycerides 121 <150 mg/dL   HDL 29 (L) >40 mg/dL   Total CHOL/HDL Ratio 3.4 RATIO   VLDL 24 0 - 40 mg/dL   LDL Cholesterol 46 0 - 99 mg/dL    Comment:        Total Cholesterol/HDL:CHD Risk Coronary Heart Disease Risk Table                     Men   Women  1/2 Average Risk   3.4   3.3  Average Risk       5.0   4.4  2 X Average Risk   9.6   7.1  3 X Average Risk  23.4   11.0        Use the calculated Patient Ratio above and the CHD Risk Table to determine the patient's CHD Risk.        ATP III CLASSIFICATION (LDL):  <100     mg/dL   Optimal  100-129  mg/dL   Near or Above                    Optimal  130-159  mg/dL   Borderline  160-189  mg/dL   High  >190     mg/dL   Very High Performed at [facility], [address], [place], [place] [address]   TSH     Status: None   Collection Time: 05/10/22  3:48 AM  Result Value Ref Range   TSH 3.603 0.350 - 4.500 uIU/mL    Comment: Performed by a 3rd Generation assay with a  functional sensitivity of <=0.01 uIU/mL. Performed at [facility], [address] [place], [place] [address]   CBG monitoring, ED     Status: Abnormal  Collection Time: 05/10/22  8:15 AM  Result Value Ref Range   Glucose-Capillary 134 (H) 70 - 99 mg/dL    Comment: Glucose reference range applies only to samples taken after fasting for at least 8 hours.  MRSA Next Gen by PCR, Nasal     Status: None   Collection Time: 05/10/22 12:23 PM   Specimen: Nasal Mucosa; Nasal Swab  Result Value Ref Range   MRSA by PCR Next Gen NOT DETECTED NOT DETECTED    Comment: (NOTE) The GeneXpert MRSA Assay (FDA approved for NASAL specimens only), is one component of a comprehensive MRSA colonization surveillance program. It is not intended to diagnose MRSA infection nor to guide or monitor treatment for MRSA infections. Test performance is not FDA approved in patients less than 20 years old. Performed at [facility], [address] [place], [place] [address]   Glucose, capillary     Status: Abnormal   Collection Time: 05/10/22 12:27 PM  Result Value Ref Range   Glucose-Capillary 143 (H) 70 - 99 mg/dL    Comment: Glucose reference range applies only to samples taken after fasting for at least 8 hours.  Glucose, capillary     Status: Abnormal   Collection Time: 05/10/22  3:15 PM  Result Value Ref Range   Glucose-Capillary 158 (H) 70 - 99 mg/dL    Comment: Glucose reference range applies only to samples taken after fasting for at least 8 hours.  Glucose, capillary     Status: Abnormal   Collection Time: 05/10/22  9:36 PM  Result Value Ref Range   Glucose-Capillary 163 (H) 70 - 99 mg/dL    Comment: Glucose reference range applies only to samples taken after fasting for at least 8 hours.  CBC     Status: Abnormal   Collection Time: 05/11/22  1:26 AM  Result Value Ref Range   WBC 8.2 4.0 - 10.5 K/uL   RBC 2.57 (L) 4.22 - 5.81 MIL/uL   Hemoglobin 8.1 (L) 13.0 - 17.0 g/dL   HCT 25.7 (L) 39.0 - 52.0 %    MCV 100.0 80.0 - 100.0 fL   MCH 31.5 26.0 - 34.0 pg   MCHC 31.5 30.0 - 36.0 g/dL   RDW 14.0 11.5 - 15.5 %   Platelets 206 150 - 400 K/uL   nRBC 0.0 0.0 - 0.2 %    Comment: Performed at [facility], [address] [place], [place] [address]  Magnesium     Status: None   Collection Time: 05/11/22  1:26 AM  Result Value Ref Range   Magnesium 2.1 1.7 - 2.4 mg/dL    Comment: Performed at [facility], [address], [place], [place] [address]  Basic metabolic panel     Status: Abnormal   Collection Time: 05/11/22  1:26 AM  Result Value Ref Range   Sodium 140 135 - 145 mmol/L   Potassium 3.6 3.5 - 5.1 mmol/L   Chloride 105 98 - 111 mmol/L   CO2 26 22 - 32 mmol/L   Glucose, Bld 139 (H) 70 - 99 mg/dL    Comment: Glucose reference range applies only to samples taken after fasting for at least 8 hours.   BUN 77 (H) 8 - 23 mg/dL   Creatinine, Ser 3.26 (H) 0.61 - 1.24 mg/dL   Calcium 8.6 (L) 8.9 - 10.3 mg/dL   GFR, Estimated 19 (L) >60 mL/min    Comment: (NOTE) Calculated using the CKD-EPI Creatinine Equation (2021)    Anion gap 9  5 - 15    Comment: Performed at [facility], [address], [place], [place] [address]  Glucose, capillary     Status: Abnormal   Collection Time: 05/11/22  7:32 AM  Result Value Ref Range   Glucose-Capillary 150 (H) 70 - 99 mg/dL    Comment: Glucose reference range applies only to samples taken after fasting for at least 8 hours.  Glucose, capillary     Status: Abnormal   Collection Time: 05/11/22 11:50 AM  Result Value Ref Range   Glucose-Capillary 160 (H) 70 - 99 mg/dL    Comment: Glucose reference range applies only to samples taken after fasting for at least 8 hours.  Basic metabolic panel     Status: Abnormal   Collection Time: 05/29/22  7:36 PM  Result Value Ref Range   Sodium 137 135 - 145 mmol/L   Potassium 4.0 3.5 - 5.1 mmol/L   Chloride 99 98 - 111 mmol/L   CO2 24 22 - 32 mmol/L   Glucose, Bld 138 (H) 70 - 99 mg/dL    Comment: Glucose  reference range applies only to samples taken after fasting for at least 8 hours.   BUN 81 (H) 8 - 23 mg/dL   Creatinine, Ser 3.88 (H) 0.61 - 1.24 mg/dL   Calcium 9.4 8.9 - 10.3 mg/dL   GFR, Estimated 15 (L) >60 mL/min    Comment: (NOTE) Calculated using the CKD-EPI Creatinine Equation (2021)    Anion gap 14 5 - 15    Comment: Performed at [facility], [address], [place], [place] [address]  CBC     Status: Abnormal   Collection Time: 05/29/22  7:36 PM  Result Value Ref Range   WBC 5.5 4.0 - 10.5 K/uL   RBC 2.51 (L) 4.22 - 5.81 MIL/uL   Hemoglobin 8.2 (L) 13.0 - 17.0 g/dL   HCT 25.2 (L) 39.0 - 52.0 %   MCV 100.4 (H) 80.0 - 100.0 fL   MCH 32.7 26.0 - 34.0 pg   MCHC 32.5 30.0 - 36.0 g/dL   RDW 14.9 11.5 - 15.5 %   Platelets 216 150 - 400 K/uL   nRBC 0.0 0.0 - 0.2 %    Comment: Performed at [facility], [address] [place], [place] [address]  Troponin I (High Sensitivity)     Status: Abnormal   Collection Time: 05/29/22  7:36 PM  Result Value Ref Range   Troponin I (High Sensitivity) 21 (H) <18 ng/L    Comment: (NOTE) Elevated high sensitivity troponin I (hsTnI) values and significant  changes across serial measurements may suggest ACS but many other  chronic and acute conditions are known to elevate hsTnI results.  Refer to the "Links" section for chest pain algorithms and additional  guidance. Performed at [facility], [address] [place], [place] [address]   Troponin I (High Sensitivity)     Status: Abnormal   Collection Time: 05/29/22  9:15 PM  Result Value Ref Range   Troponin I (High Sensitivity) 26 (H) <18 ng/L    Comment: (NOTE) Elevated high sensitivity troponin I (hsTnI) values and significant  changes across serial measurements may suggest ACS but many other  chronic and acute conditions are known to elevate hsTnI results.  Refer to the "Links" section for chest pain algorithms and additional  guidance. Performed at [facility], [address]  [place], [place] [address]   Glucose, capillary     Status: Abnormal   Collection Time: 06/04/22  12:56 PM  Result Value Ref Range   Glucose-Capillary 174 (H) 70 - 99 mg/dL    Comment: Glucose reference range applies only to samples taken after fasting for at least 8 hours.  Surgical pcr screen     Status: None   Collection Time: 06/04/22  1:30 PM   Specimen: Nasal Mucosa; Nasal Swab  Result Value Ref Range   MRSA, PCR NEGATIVE NEGATIVE   Staphylococcus aureus NEGATIVE NEGATIVE    Comment: (NOTE) The Xpert SA Assay (FDA approved for NASAL specimens in patients 70 years of age and older), is one component of a comprehensive surveillance program. It is not intended to diagnose infection nor to guide or monitor treatment. Performed at [facility], [place] [address], [place], [place] [address]   CBC per protocol     Status: Abnormal   Collection Time: 06/04/22  2:11 PM  Result Value Ref Range   WBC 5.7 4.0 - 10.5 K/uL   RBC 2.87 (L) 4.22 - 5.81 MIL/uL   Hemoglobin 9.5 (L) 13.0 - 17.0 g/dL   HCT 29.1 (L) 39.0 - 52.0 %   MCV 101.4 (H) 80.0 - 100.0 fL   MCH 33.1 26.0 - 34.0 pg   MCHC 32.6 30.0 - 36.0 g/dL   RDW 15.2 11.5 - 15.5 %   Platelets 203 150 - 400 K/uL   nRBC 0.0 0.0 - 0.2 %    Comment: Performed at [facility], [place] [address], [place], [place] [address]  Basic metabolic panel per protocol     Status: Abnormal   Collection Time: 06/04/22  2:11 PM  Result Value Ref Range   Sodium 139 135 - 145 mmol/L   Potassium 3.5 3.5 - 5.1 mmol/L   Chloride 103 98 - 111 mmol/L   CO2 25 22 - 32 mmol/L   Glucose, Bld 149 (H) 70 - 99 mg/dL    Comment: Glucose reference range applies only to samples taken after fasting for at least 8 hours.   BUN 69 (H) 8 - 23 mg/dL   Creatinine, Ser 3.00 (H) 0.61 - 1.24 mg/dL   Calcium 9.0 8.9 - 10.3 mg/dL   GFR, Estimated 21 (L) >60 mL/min    Comment: (NOTE) Calculated using the CKD-EPI Creatinine Equation (2021)    Anion gap 11 5 - 15     Comment: Performed at [facility] [address] [place], [place] [address]  Hemoglobin A1c per protocol     Status: Abnormal   Collection Time: 06/04/22  2:11 PM  Result Value Ref Range   Hgb A1c MFr Bld 6.1 (H) 4.8 - 5.6 %    Comment: (NOTE) Pre diabetes:          5.7%-6.4%  Diabetes:              >6.4%  Glycemic control for   <7.0% adults with diabetes    Mean Plasma Glucose 128.37 mg/dL    Comment: Performed at [facility] [address] [place], [place] [address]  Glucose, capillary     Status: Abnormal   Collection Time: 06/10/22  8:45 AM  Result Value Ref Range   Glucose-Capillary 150 (H) 70 - 99 mg/dL    Comment: Glucose reference range applies only to samples taken after fasting for at least 8 hours.  Glucose, capillary     Status: Abnormal   Collection Time: 06/10/22 10:50 AM  Result Value Ref Range   Glucose-Capillary 155 (H) 70 -  99 mg/dL    Comment: Glucose reference range applies only to samples taken after fasting for at least 8 hours.  Glucose, capillary     Status: Abnormal   Collection Time: 06/10/22  1:01 PM  Result Value Ref Range   Glucose-Capillary 153 (H) 70 - 99 mg/dL    Comment: Glucose reference range applies only to samples taken after fasting for at least 8 hours.  Glucose, capillary     Status: Abnormal   Collection Time: 06/10/22  6:00 PM  Result Value Ref Range   Glucose-Capillary 265 (H) 70 - 99 mg/dL    Comment: Glucose reference range applies only to samples taken after fasting for at least 8 hours.  Glucose, capillary     Status: Abnormal   Collection Time: 06/10/22  9:13 PM  Result Value Ref Range   Glucose-Capillary 297 (H) 70 - 99 mg/dL    Comment: Glucose reference range applies only to samples taken after fasting for at least 8 hours.   Comment 1 Notify RN    Comment 2 Document in Chart   Glucose, capillary     Status: Abnormal   Collection Time: 06/11/22 12:19 AM  Result Value Ref Range   Glucose-Capillary 213 (H)  70 - 99 mg/dL    Comment: Glucose reference range applies only to samples taken after fasting for at least 8 hours.   Comment 1 Notify RN    Comment 2 Document in Chart   Glucose, capillary     Status: Abnormal   Collection Time: 06/11/22  3:09 AM  Result Value Ref Range   Glucose-Capillary 150 (H) 70 - 99 mg/dL    Comment: Glucose reference range applies only to samples taken after fasting for at least 8 hours.   Comment 1 Notify RN    Comment 2 Document in Chart   Glucose, capillary     Status: Abnormal   Collection Time: 06/11/22  8:13 AM  Result Value Ref Range   Glucose-Capillary 121 (H) 70 - 99 mg/dL    Comment: Glucose reference range applies only to samples taken after fasting for at least 8 hours.   Comment 1 Notify RN    Comment 2 Document in Chart   Glucose, capillary     Status: Abnormal   Collection Time: 06/11/22 11:50 AM  Result Value Ref Range   Glucose-Capillary 175 (H) 70 - 99 mg/dL    Comment: Glucose reference range applies only to samples taken after fasting for at least 8 hours.   Comment 1 Notify RN    Comment 2 Document in Chart   Glucose, capillary     Status: Abnormal   Collection Time: 06/11/22  4:00 PM  Result Value Ref Range   Glucose-Capillary 170 (H) 70 - 99 mg/dL    Comment: Glucose reference range applies only to samples taken after fasting for at least 8 hours.   Comment 1 Notify RN    Comment 2 Document in Chart   Glucose, capillary     Status: Abnormal   Collection Time: 06/11/22  7:32 PM  Result Value Ref Range   Glucose-Capillary 180 (H) 70 - 99 mg/dL    Comment: Glucose reference range applies only to samples taken after fasting for at least 8 hours.   Comment 1 Notify RN    Comment 2 Document in Chart   Glucose, capillary     Status: Abnormal   Collection Time: 06/11/22 11:38 PM  Result Value Ref Range   Glucose-Capillary 134 (  H) 70 - 99 mg/dL    Comment: Glucose reference range applies only to samples taken after fasting for at least  8 hours.   Comment 1 Notify RN    Comment 2 Document in Chart   Glucose, capillary     Status: Abnormal   Collection Time: 06/12/22  3:22 AM  Result Value Ref Range   Glucose-Capillary 139 (H) 70 - 99 mg/dL    Comment: Glucose reference range applies only to samples taken after fasting for at least 8 hours.   Comment 1 Notify RN    Comment 2 Document in Chart   Glucose, capillary     Status: Abnormal   Collection Time: 06/12/22  8:13 AM  Result Value Ref Range   Glucose-Capillary 163 (H) 70 - 99 mg/dL    Comment: Glucose reference range applies only to samples taken after fasting for at least 8 hours.  Reticulocytes     Status: Abnormal   Collection Time: 07/17/22  2:04 PM  Result Value Ref Range   Retic Ct Pct 1.7 0.4 - 3.1 %   RBC. 2.70 (L) 4.22 - 5.81 MIL/uL   Retic Count, Absolute 45.9 19.0 - 186.0 K/uL   Immature Retic Fract 12.0 2.3 - 15.9 %    Comment: Performed at [facility], [address] [place], [place] [address]  Folate     Status: None   Collection Time: 07/17/22  2:04 PM  Result Value Ref Range   Folate 9.1 >5.9 ng/mL    Comment: Performed at [facility], [address] [place], [place] [address]  Vitamin B12     Status: Abnormal   Collection Time: 07/17/22  2:04 PM  Result Value Ref Range   Vitamin B-12 118 (L) 180 - 914 pg/mL    Comment: (NOTE) This assay is not validated for testing neonatal or myeloproliferative syndrome specimens for Vitamin B12 levels. Performed at [facility], [address], [place], [place] [address]   Ferritin     Status: Abnormal   Collection Time: 07/17/22  2:04 PM  Result Value Ref Range   Ferritin 352 (H) 24 - 336 ng/mL    Comment: Performed at [facility], [address], [place], [place] [address]  Iron and TIBC [facility]/AP/ASH/[facility]/[place] ONLY)     Status: Abnormal   Collection Time: 07/17/22  2:04 PM  Result Value Ref Range   Iron 17 (L) 45 - 182 ug/dL   TIBC 205 (L) 250 - 450 ug/dL   Saturation Ratios 8 (L) 17.9 -  39.5 %   UIBC 188 ug/dL    Comment: Performed at [facility], [address] [place], [place] [address]  Lactate dehydrogenase     Status: None   Collection Time: 07/17/22  2:04 PM  Result Value Ref Range   LDH 160 98 - 192 U/L    Comment: Performed at [facility], [address] [place], [place] [address]  CBC with Differential     Status: Abnormal   Collection Time: 07/17/22  2:04 PM  Result Value Ref Range   WBC 6.7 4.0 - 10.5 K/uL   RBC 2.61 (L) 4.22 - 5.81 MIL/uL   Hemoglobin 8.4 (L) 13.0 - 17.0 g/dL   HCT 25.9 (L) 39.0 - 52.0 %   MCV 99.2 80.0 - 100.0 fL   MCH 32.2 26.0 - 34.0 pg   MCHC 32.4 30.0 - 36.0 g/dL   RDW 16.5 (H) 11.5 - 15.5 %   Platelets 206 150 - 400  K/uL   nRBC 0.0 0.0 - 0.2 %   Neutrophils Relative % 58 %   Neutro Abs 3.9 1.7 - 7.7 K/uL   Lymphocytes Relative 14 %   Lymphs Abs 1.0 0.7 - 4.0 K/uL   Monocytes Relative 18 %   Monocytes Absolute 1.2 (H) 0.1 - 1.0 K/uL   Eosinophils Relative 10 %   Eosinophils Absolute 0.6 (H) 0.0 - 0.5 K/uL   Basophils Relative 0 %   Basophils Absolute 0.0 0.0 - 0.1 K/uL   Immature Granulocytes 0 %   Abs Immature Granulocytes 0.01 0.00 - 0.07 K/uL    Comment: Performed at [facility], [address], [place], [place] [address]    RADIOGRAPHIC STUDIES: I have personally reviewed the radiological images as listed and agreed with the findings in the report. No results found.  ASSESSMENT:  Normocytic anemia: - Patient seen at the request of Dr. Theador Hawthorne. - CBC (06/27/2022): Hb-8.1, MCV-95, creatinine-3.07, ferritin-527, percent saturation 15. - EGD on 02/05/2021 and colonoscopy on 07/24/2020-nonbleeding hemorrhoids.  6 mm polyp in the descending colon removed. - No prior history of blood transfusion.  No bleeding per rectum or melena. - On iron tablet daily for the past 1 year. - No B symptoms.  He is on a diet and lost 30 pounds in the last 1 year.   Social/family history: - Lives at home by himself.  Independent of ADLs  and IADLs.  Worked in a cigarette factory.  Quit smoking in 1994.  1 pack/day for 20 years.  No family history of anemia.  Father had brain tumor.   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  However we will also check for other nutritional deficiencies including F02, folic acid, copper, MMA along with repeating ferritin and iron panel. - We will check for bone marrow infiltrative process with SPEP, free light chains and immunofixation. - We will check stool for occult blood. - Reevaluate in 2 to 3 weeks. - If iron panel is low would recommend parenteral iron therapy.  If still there is no improvement, will consider erythropoiesis stimulating agents.   All questions were answered. The patient knows to call the clinic with any problems, questions or concerns.  Derek Jack, MD 07/17/22 5:08 PM  [facility] [phone]   I, Thana Ates, am acting as a scribe for Dr. Derek Jack.  I, Derek Jack MD, have reviewed the above documentation for accuracy and completeness, and I agree with the above.

## 2022-07-17 NOTE — Patient Instructions (Signed)
[name] at [facility] Discharge Instructions   You were seen and examined today by Dr. Delton Coombes. He is a blood specialist who Dr. Theador Hawthorne referred you to for anemia (a low hemoglobin).   We will obtain some special blood tests from you today to investigate further what may be causing your blood to be low. We will also give you some stool cards that we will test to see if you may be losing blood in your stool. You will collect these at home and return these to the lab.   We may need to arrange for you to receive IV iron. We will call you and let you know and get you scheduled for this.   Return in 2-3 weeks as scheduled to review the results of your other lab work from today.    Thank you for choosing [facility] at [facility] to provide your oncology and hematology care.  To afford each patient quality time with our provider, please arrive at least 15 minutes before your scheduled appointment time.   If you have a lab appointment with the [facility] please come in thru the Main Entrance and check in at the main information desk.  You need to re-schedule your appointment should you arrive 10 or more minutes late.  We strive to give you quality time with our providers, and arriving late affects you and other patients whose appointments are after yours.  Also, if you no show three or more times for appointments you may be dismissed from the clinic at the providers discretion.     Again, thank you for choosing [facility].  Our hope is that these requests will decrease the amount of time that you wait before being seen by our physicians.       _____________________________________________________________  Should you have questions after your visit to [facility], please contact our office at [phone] and follow the prompts.  Our office hours are 8:00 a.m. and 4:30 p.m. Monday - Friday.  Please note that voicemails  left after 4:00 p.m. may not be returned until the following business day.  We are closed weekends and major holidays.  You do have access to a nurse 24-7, just call the main number to the clinic [phone] and do not press any options, hold on the line and a nurse will answer the phone.    For prescription refill requests, have your pharmacy contact our office and allow 72 hours.    Due to Covid, you will need to wear a mask upon entering the hospital. If you do not have a mask, a mask will be given to you at the Main Entrance upon arrival. For doctor visits, patients may have 1 support person age 77 or older with them. For treatment visits, patients can not have anyone with them due to social distancing guidelines and our immunocompromised population.

## 2022-07-18 ENCOUNTER — Encounter (HOSPITAL_COMMUNITY): Payer: Self-pay | Admitting: Hematology

## 2022-07-18 ENCOUNTER — Telehealth (HOSPITAL_COMMUNITY): Payer: Self-pay | Admitting: *Deleted

## 2022-07-18 DIAGNOSIS — E538 Deficiency of other specified B group vitamins: Secondary | ICD-10-CM | POA: Insufficient documentation

## 2022-07-18 LAB — KAPPA/LAMBDA LIGHT CHAINS
Kappa free light chain: 79.5 mg/L — ABNORMAL HIGH (ref 3.3–19.4)
Kappa, lambda light chain ratio: 1.49 (ref 0.26–1.65)
Lambda free light chains: 53.5 mg/L — ABNORMAL HIGH (ref 5.7–26.3)

## 2022-07-18 NOTE — Addendum Note (Signed)
Addended by: Derek Jack on: 07/18/2022 09:37 AM   Modules accepted: Orders

## 2022-07-18 NOTE — Telephone Encounter (Signed)
Attempted to contact pt regarding iron results and B12 results from yesterday. Left VM that pt needs to receive an iron infusion and start on monthly B12 injections as both iron and B12 were low. I left my contact number and instructed him to call me should he have any questions. Pt also told to expect a call from scheduling to set up these appointments.

## 2022-07-19 LAB — IMMUNOFIXATION ELECTROPHORESIS
IgA: 356 mg/dL (ref 61–437)
IgG (Immunoglobin G), Serum: 1084 mg/dL (ref 603–1613)
IgM (Immunoglobulin M), Srm: 13 mg/dL — ABNORMAL LOW (ref 15–143)
Total Protein ELP: 7 g/dL (ref 6.0–8.5)

## 2022-07-19 LAB — METHYLMALONIC ACID, SERUM: Methylmalonic Acid, Quantitative: 1852 nmol/L — ABNORMAL HIGH (ref 0–378)

## 2022-07-19 LAB — HAPTOGLOBIN: Haptoglobin: 298 mg/dL (ref 34–355)

## 2022-07-20 LAB — COPPER, SERUM: Copper: 115 ug/dL (ref 69–132)

## 2022-07-22 ENCOUNTER — Inpatient Hospital Stay (HOSPITAL_COMMUNITY): Payer: Medicare Other

## 2022-07-22 LAB — PROTEIN ELECTROPHORESIS, SERUM
A/G Ratio: 1.2 (ref 0.7–1.7)
Albumin ELP: 3.8 g/dL (ref 2.9–4.4)
Alpha-1-Globulin: 0.3 g/dL (ref 0.0–0.4)
Alpha-2-Globulin: 0.9 g/dL (ref 0.4–1.0)
Beta Globulin: 1 g/dL (ref 0.7–1.3)
Gamma Globulin: 1 g/dL (ref 0.4–1.8)
Globulin, Total: 3.3 g/dL (ref 2.2–3.9)
Total Protein ELP: 7.1 g/dL (ref 6.0–8.5)

## 2022-07-23 ENCOUNTER — Encounter: Payer: Self-pay | Admitting: *Deleted

## 2022-07-24 ENCOUNTER — Inpatient Hospital Stay: Payer: Medicare Other | Attending: Hematology

## 2022-07-24 DIAGNOSIS — D509 Iron deficiency anemia, unspecified: Secondary | ICD-10-CM | POA: Insufficient documentation

## 2022-07-24 DIAGNOSIS — E538 Deficiency of other specified B group vitamins: Secondary | ICD-10-CM | POA: Insufficient documentation

## 2022-07-24 DIAGNOSIS — L089 Local infection of the skin and subcutaneous tissue, unspecified: Secondary | ICD-10-CM | POA: Diagnosis not present

## 2022-07-24 DIAGNOSIS — S80819A Abrasion, unspecified lower leg, initial encounter: Secondary | ICD-10-CM | POA: Diagnosis not present

## 2022-07-24 NOTE — Patient Instructions (Signed)
[facility]  Discharge Instructions: Thank you for choosing [facility] to provide your oncology and hematology care.  If you have a lab appointment with the Cancer Center, please come in thru the Main Entrance and check in at the main information desk.  Wear comfortable clothing and clothing appropriate for easy access to any Portacath or PICC line.   We strive to give you quality time with your provider. You may need to reschedule your appointment if you arrive late (15 or more minutes).  Arriving late affects you and other patients whose appointments are after yours.  Also, if you miss three or more appointments without notifying the office, you may be dismissed from the clinic at the provider's discretion.      For prescription refill requests, have your pharmacy contact our office and allow 72 hours for refills to be completed.      To help prevent nausea and vomiting after your treatment, we encourage you to take your nausea medication as directed.  BELOW ARE SYMPTOMS THAT SHOULD BE REPORTED IMMEDIATELY: *FEVER GREATER THAN 100.4 F (38 C) OR HIGHER *CHILLS OR SWEATING *NAUSEA AND VOMITING THAT IS NOT CONTROLLED WITH YOUR NAUSEA MEDICATION *UNUSUAL SHORTNESS OF BREATH *UNUSUAL BRUISING OR BLEEDING *URINARY PROBLEMS (pain or burning when urinating, or frequent urination) *BOWEL PROBLEMS (unusual diarrhea, constipation, pain near the anus) TENDERNESS IN MOUTH AND THROAT WITH OR WITHOUT PRESENCE OF ULCERS (sore throat, sores in mouth, or a toothache) UNUSUAL RASH, SWELLING OR PAIN  UNUSUAL VAGINAL DISCHARGE OR ITCHING   Items with * indicate a potential emergency and should be followed up as soon as possible or go to the Emergency Department if any problems should occur.  Please show the CHEMOTHERAPY ALERT CARD or IMMUNOTHERAPY ALERT CARD at check-in to the Emergency Department and triage nurse.  Should you have questions after your visit or need to  cancel or reschedule your appointment, please contact [facility] [phone]  and follow the prompts.  Office hours are 8:00 a.m. to 4:30 p.m. Monday - Friday. Please note that voicemails left after 4:00 p.m. may not be returned until the following business day.  We are closed weekends and major holidays. You have access to a nurse at all times for urgent questions. Please call the main number to the clinic [phone] and follow the prompts.  For any non-urgent questions, you may also contact your provider using MyChart. We now offer e-Visits for anyone 18 and older to request care online for non-urgent symptoms. For details visit [email or web address].   Also download the MyChart app! Go to the app store, search "MyChart", open the app, select [facility], and log in with your MyChart username and password.  Masks are optional in the cancer centers. If you would like for your care team to wear a mask while they are taking care of you, please let them know. For doctor visits, patients may have with them one support person who is at least 77 years old. At this time, visitors are not allowed in the infusion area.  

## 2022-07-24 NOTE — Progress Notes (Signed)
Patient to return tomorrow for Venofer infusion.

## 2022-07-25 ENCOUNTER — Inpatient Hospital Stay: Payer: Medicare Other

## 2022-07-25 VITALS — BP 154/65 | HR 53 | Temp 96.4°F | Resp 18

## 2022-07-25 DIAGNOSIS — D509 Iron deficiency anemia, unspecified: Secondary | ICD-10-CM | POA: Diagnosis not present

## 2022-07-25 DIAGNOSIS — E538 Deficiency of other specified B group vitamins: Secondary | ICD-10-CM | POA: Diagnosis not present

## 2022-07-25 MED ORDER — SODIUM CHLORIDE 0.9 % IV SOLN: Freq: Once | INTRAVENOUS | Status: AC

## 2022-07-25 MED ORDER — LORATADINE 10 MG PO TABS
10.0000 mg | ORAL_TABLET | Freq: Once | ORAL | Status: AC
  Administered 2022-07-25: 10 mg via ORAL
  Filled 2022-07-25: qty 1

## 2022-07-25 MED ORDER — CYANOCOBALAMIN 1000 MCG/ML IJ SOLN
1000.0000 ug | Freq: Once | INTRAMUSCULAR | Status: AC
  Administered 2022-07-25: 1000 ug via INTRAMUSCULAR
  Filled 2022-07-25: qty 1

## 2022-07-25 MED ORDER — SODIUM CHLORIDE 0.9 % IV SOLN
400.0000 mg | Freq: Once | INTRAVENOUS | Status: AC
  Administered 2022-07-25: 400 mg via INTRAVENOUS
  Filled 2022-07-25: qty 10

## 2022-07-25 MED ORDER — ACETAMINOPHEN 325 MG PO TABS
650.0000 mg | ORAL_TABLET | Freq: Once | ORAL | Status: AC
  Administered 2022-07-25: 650 mg via ORAL
  Filled 2022-07-25: qty 2

## 2022-07-25 NOTE — Progress Notes (Signed)
Patient presents today for iron infusion. Patient is in satisfactory condition with no complaints voiced.  Vital signs are stable.  We will proceed with infusion per provider's orders.    Patient tolerated Vitamin B12 injection with no complaints voiced.  Site clean and dry with no bruising or swelling noted.  No complaints of pain.    Patient tolerated treatment and injection well with no complaints voiced.  Patient left via wheelchair in stable condition.  Vital signs stable at discharge.  Follow up as scheduled.

## 2022-07-25 NOTE — Patient Instructions (Signed)
[facility]  Discharge Instructions: Thank you for choosing [facility] to provide your oncology and hematology care.  If you have a lab appointment with the [facility], please come in thru the Main Entrance and check in at the main information desk.  Wear comfortable clothing and clothing appropriate for easy access to any Portacath or PICC line.   We strive to give you quality time with your provider. You may need to reschedule your appointment if you arrive late (15 or more minutes).  Arriving late affects you and other patients whose appointments are after yours.  Also, if you miss three or more appointments without notifying the office, you may be dismissed from the clinic at the provider's discretion.      For prescription refill requests, have your pharmacy contact our office and allow 72 hours for refills to be completed.    Iron Sucrose Injection What is this medication? IRON SUCROSE (EYE ern SOO krose) treats low levels of iron (iron deficiency anemia) in people with kidney disease. Iron is a mineral that plays an important role in making red blood cells, which carry oxygen from your lungs to the rest of your body. This medicine may be used for other purposes; ask your health care provider or pharmacist if you have questions. COMMON BRAND NAME(S): Venofer What should I tell my care team before I take this medication? They need to know if you have any of these conditions: Anemia not caused by low iron levels Heart disease High levels of iron in the blood Kidney disease Liver disease An unusual or allergic reaction to iron, other medications, foods, dyes, or preservatives Pregnant or trying to get pregnant Breast-feeding How should I use this medication? This medication is for infusion into a vein. It is given in a hospital or clinic setting. Talk to your care team about the use of this medication in children. While this medication may be  prescribed for children as young as 2 years for selected conditions, precautions do apply. Overdosage: If you think you have taken too much of this medicine contact a poison control center or emergency room at once. NOTE: This medicine is only for you. Do not share this medicine with others. What if I miss a dose? It is important not to miss your dose. Call your care team if you are unable to keep an appointment. What may interact with this medication? Do not take this medication with any of the following: Deferoxamine Dimercaprol Other iron products This medication may also interact with the following: Chloramphenicol Deferasirox This list may not describe all possible interactions. Give your health care provider a list of all the medicines, herbs, non-prescription drugs, or dietary supplements you use. Also tell them if you smoke, drink alcohol, or use illegal drugs. Some items may interact with your medicine. What should I watch for while using this medication? Visit your care team regularly. Tell your care team if your symptoms do not start to get better or if they get worse. You may need blood work done while you are taking this medication. You may need to follow a special diet. Talk to your care team. Foods that contain iron include: whole grains/cereals, dried fruits, beans, or peas, leafy green vegetables, and organ meats (liver, kidney). What side effects may I notice from receiving this medication? Side effects that you should report to your care team as soon as possible: Allergic reactions--skin rash, itching, hives, swelling of the face, lips,  tongue, or throat Low blood pressure--dizziness, feeling faint or lightheaded, blurry vision Shortness of breath Side effects that usually do not require medical attention (report to your care team if they continue or are bothersome): Flushing Headache Joint pain Muscle pain Nausea Pain, redness, or irritation at injection site This  list may not describe all possible side effects. Call your doctor for medical advice about side effects. You may report side effects to FDA at 1-800-FDA-1088. Where should I keep my medication? This medication is given in a hospital or clinic and will not be stored at home. NOTE: This sheet is a summary. It may not cover all possible information. If you have questions about this medicine, talk to your doctor, pharmacist, or health care provider.  2023 Elsevier/Gold Standard (2008-01-30 00:00:00)       To help prevent nausea and vomiting after your treatment, we encourage you to take your nausea medication as directed.  BELOW ARE SYMPTOMS THAT SHOULD BE REPORTED IMMEDIATELY: *FEVER GREATER THAN 100.4 F (38 C) OR HIGHER *CHILLS OR SWEATING *NAUSEA AND VOMITING THAT IS NOT CONTROLLED WITH YOUR NAUSEA MEDICATION *UNUSUAL SHORTNESS OF BREATH *UNUSUAL BRUISING OR BLEEDING *URINARY PROBLEMS (pain or burning when urinating, or frequent urination) *BOWEL PROBLEMS (unusual diarrhea, constipation, pain near the anus) TENDERNESS IN MOUTH AND THROAT WITH OR WITHOUT PRESENCE OF ULCERS (sore throat, sores in mouth, or a toothache) UNUSUAL RASH, SWELLING OR PAIN  UNUSUAL VAGINAL DISCHARGE OR ITCHING   Items with * indicate a potential emergency and should be followed up as soon as possible or go to the Emergency Department if any problems should occur.  Please show the CHEMOTHERAPY ALERT CARD or IMMUNOTHERAPY ALERT CARD at check-in to the Emergency Department and triage nurse.  Should you have questions after your visit or need to cancel or reschedule your appointment, please contact [facility] [phone]  and follow the prompts.  Office hours are 8:00 a.m. to 4:30 p.m. Monday - Friday. Please note that voicemails left after 4:00 p.m. may not be returned until the following business day.  We are closed weekends and major holidays. You have access to a nurse at all times for urgent  questions. Please call the main number to the clinic [phone] and follow the prompts.  For any non-urgent questions, you may also contact your provider using MyChart. We now offer e-Visits for anyone 54 and older to request care online for non-urgent symptoms. For details visit [email or web address].   Also download the MyChart app! Go to the app store, search "MyChart", open the app, select [facility], and log in with your MyChart username and password.  Masks are optional in the cancer centers. If you would like for your care team to wear a mask while they are taking care of you, please let them know. For doctor visits, patients may have with them one support person who is at least 77 years old. At this time, visitors are not allowed in the infusion area.

## 2022-07-26 DIAGNOSIS — A881 Epidemic vertigo: Secondary | ICD-10-CM | POA: Diagnosis not present

## 2022-07-28 ENCOUNTER — Encounter (HOSPITAL_COMMUNITY): Payer: Self-pay

## 2022-07-28 ENCOUNTER — Other Ambulatory Visit: Payer: Self-pay

## 2022-07-28 ENCOUNTER — Emergency Department (HOSPITAL_COMMUNITY): Payer: Medicare Other

## 2022-07-28 ENCOUNTER — Other Ambulatory Visit (HOSPITAL_COMMUNITY): Payer: Self-pay

## 2022-07-28 ENCOUNTER — Inpatient Hospital Stay (HOSPITAL_COMMUNITY)
Admission: EM | Admit: 2022-07-28 | Discharge: 2022-08-10 | DRG: 291 | Disposition: A | Discharge status: Skilled Nursing Facility | Payer: Medicare Other | Attending: Family Medicine | Admitting: Family Medicine

## 2022-07-28 DIAGNOSIS — Z87891 Personal history of nicotine dependence: Secondary | ICD-10-CM

## 2022-07-28 DIAGNOSIS — R778 Other specified abnormalities of plasma proteins: Secondary | ICD-10-CM | POA: Diagnosis not present

## 2022-07-28 DIAGNOSIS — E669 Obesity, unspecified: Secondary | ICD-10-CM | POA: Diagnosis present

## 2022-07-28 DIAGNOSIS — D649 Anemia, unspecified: Secondary | ICD-10-CM | POA: Diagnosis not present

## 2022-07-28 DIAGNOSIS — I5033 Acute on chronic diastolic (congestive) heart failure: Secondary | ICD-10-CM | POA: Diagnosis not present

## 2022-07-28 DIAGNOSIS — E876 Hypokalemia: Secondary | ICD-10-CM | POA: Diagnosis present

## 2022-07-28 DIAGNOSIS — E8779 Other fluid overload: Secondary | ICD-10-CM | POA: Diagnosis not present

## 2022-07-28 DIAGNOSIS — M5136 Other intervertebral disc degeneration, lumbar region: Secondary | ICD-10-CM | POA: Diagnosis not present

## 2022-07-28 DIAGNOSIS — K219 Gastro-esophageal reflux disease without esophagitis: Secondary | ICD-10-CM | POA: Diagnosis not present

## 2022-07-28 DIAGNOSIS — S80822A Blister (nonthermal), left lower leg, initial encounter: Secondary | ICD-10-CM | POA: Diagnosis present

## 2022-07-28 DIAGNOSIS — X58XXXA Exposure to other specified factors, initial encounter: Secondary | ICD-10-CM | POA: Diagnosis present

## 2022-07-28 DIAGNOSIS — M6281 Muscle weakness (generalized): Secondary | ICD-10-CM | POA: Diagnosis not present

## 2022-07-28 DIAGNOSIS — D509 Iron deficiency anemia, unspecified: Secondary | ICD-10-CM | POA: Diagnosis present

## 2022-07-28 DIAGNOSIS — Z89012 Acquired absence of left thumb: Secondary | ICD-10-CM

## 2022-07-28 DIAGNOSIS — I10 Essential (primary) hypertension: Secondary | ICD-10-CM | POA: Diagnosis present

## 2022-07-28 DIAGNOSIS — I441 Atrioventricular block, second degree: Secondary | ICD-10-CM | POA: Diagnosis not present

## 2022-07-28 DIAGNOSIS — Z992 Dependence on renal dialysis: Secondary | ICD-10-CM | POA: Diagnosis not present

## 2022-07-28 DIAGNOSIS — Z981 Arthrodesis status: Secondary | ICD-10-CM | POA: Diagnosis not present

## 2022-07-28 DIAGNOSIS — I493 Ventricular premature depolarization: Secondary | ICD-10-CM | POA: Diagnosis present

## 2022-07-28 DIAGNOSIS — Z955 Presence of coronary angioplasty implant and graft: Secondary | ICD-10-CM

## 2022-07-28 DIAGNOSIS — I501 Left ventricular failure: Secondary | ICD-10-CM | POA: Diagnosis not present

## 2022-07-28 DIAGNOSIS — I2510 Atherosclerotic heart disease of native coronary artery without angina pectoris: Secondary | ICD-10-CM | POA: Diagnosis not present

## 2022-07-28 DIAGNOSIS — E1122 Type 2 diabetes mellitus with diabetic chronic kidney disease: Secondary | ICD-10-CM | POA: Diagnosis present

## 2022-07-28 DIAGNOSIS — D72829 Elevated white blood cell count, unspecified: Secondary | ICD-10-CM | POA: Diagnosis present

## 2022-07-28 DIAGNOSIS — F1120 Opioid dependence, uncomplicated: Secondary | ICD-10-CM | POA: Diagnosis present

## 2022-07-28 DIAGNOSIS — R1030 Lower abdominal pain, unspecified: Secondary | ICD-10-CM | POA: Diagnosis not present

## 2022-07-28 DIAGNOSIS — E7800 Pure hypercholesterolemia, unspecified: Secondary | ICD-10-CM | POA: Diagnosis not present

## 2022-07-28 DIAGNOSIS — G894 Chronic pain syndrome: Secondary | ICD-10-CM | POA: Diagnosis not present

## 2022-07-28 DIAGNOSIS — N179 Acute kidney failure, unspecified: Secondary | ICD-10-CM | POA: Diagnosis not present

## 2022-07-28 DIAGNOSIS — E1140 Type 2 diabetes mellitus with diabetic neuropathy, unspecified: Secondary | ICD-10-CM | POA: Diagnosis present

## 2022-07-28 DIAGNOSIS — E785 Hyperlipidemia, unspecified: Secondary | ICD-10-CM | POA: Diagnosis not present

## 2022-07-28 DIAGNOSIS — A419 Sepsis, unspecified organism: Secondary | ICD-10-CM | POA: Diagnosis not present

## 2022-07-28 DIAGNOSIS — R7881 Bacteremia: Secondary | ICD-10-CM | POA: Diagnosis not present

## 2022-07-28 DIAGNOSIS — J90 Pleural effusion, not elsewhere classified: Secondary | ICD-10-CM | POA: Diagnosis not present

## 2022-07-28 DIAGNOSIS — I429 Cardiomyopathy, unspecified: Secondary | ICD-10-CM | POA: Diagnosis not present

## 2022-07-28 DIAGNOSIS — N400 Enlarged prostate without lower urinary tract symptoms: Secondary | ICD-10-CM | POA: Diagnosis present

## 2022-07-28 DIAGNOSIS — R262 Difficulty in walking, not elsewhere classified: Secondary | ICD-10-CM | POA: Diagnosis not present

## 2022-07-28 DIAGNOSIS — Z96653 Presence of artificial knee joint, bilateral: Secondary | ICD-10-CM | POA: Diagnosis present

## 2022-07-28 DIAGNOSIS — R0600 Dyspnea, unspecified: Secondary | ICD-10-CM | POA: Diagnosis not present

## 2022-07-28 DIAGNOSIS — J811 Chronic pulmonary edema: Secondary | ICD-10-CM | POA: Diagnosis not present

## 2022-07-28 DIAGNOSIS — I959 Hypotension, unspecified: Secondary | ICD-10-CM | POA: Diagnosis not present

## 2022-07-28 DIAGNOSIS — R54 Age-related physical debility: Secondary | ICD-10-CM | POA: Diagnosis present

## 2022-07-28 DIAGNOSIS — G4733 Obstructive sleep apnea (adult) (pediatric): Secondary | ICD-10-CM | POA: Diagnosis not present

## 2022-07-28 DIAGNOSIS — I130 Hypertensive heart and chronic kidney disease with heart failure and stage 1 through stage 4 chronic kidney disease, or unspecified chronic kidney disease: Principal | ICD-10-CM | POA: Diagnosis present

## 2022-07-28 DIAGNOSIS — I25119 Atherosclerotic heart disease of native coronary artery with unspecified angina pectoris: Secondary | ICD-10-CM | POA: Diagnosis present

## 2022-07-28 DIAGNOSIS — I248 Other forms of acute ischemic heart disease: Secondary | ICD-10-CM | POA: Diagnosis present

## 2022-07-28 DIAGNOSIS — Z7982 Long term (current) use of aspirin: Secondary | ICD-10-CM

## 2022-07-28 DIAGNOSIS — D631 Anemia in chronic kidney disease: Secondary | ICD-10-CM | POA: Diagnosis present

## 2022-07-28 DIAGNOSIS — I480 Paroxysmal atrial fibrillation: Secondary | ICD-10-CM | POA: Diagnosis not present

## 2022-07-28 DIAGNOSIS — R109 Unspecified abdominal pain: Secondary | ICD-10-CM | POA: Diagnosis present

## 2022-07-28 DIAGNOSIS — Z7189 Other specified counseling: Secondary | ICD-10-CM | POA: Diagnosis not present

## 2022-07-28 DIAGNOSIS — N186 End stage renal disease: Secondary | ICD-10-CM | POA: Diagnosis not present

## 2022-07-28 DIAGNOSIS — Z515 Encounter for palliative care: Secondary | ICD-10-CM | POA: Diagnosis not present

## 2022-07-28 DIAGNOSIS — M109 Gout, unspecified: Secondary | ICD-10-CM | POA: Diagnosis not present

## 2022-07-28 DIAGNOSIS — R001 Bradycardia, unspecified: Secondary | ICD-10-CM | POA: Diagnosis present

## 2022-07-28 DIAGNOSIS — R079 Chest pain, unspecified: Secondary | ICD-10-CM | POA: Diagnosis not present

## 2022-07-28 DIAGNOSIS — E1165 Type 2 diabetes mellitus with hyperglycemia: Secondary | ICD-10-CM | POA: Diagnosis not present

## 2022-07-28 DIAGNOSIS — M961 Postlaminectomy syndrome, not elsewhere classified: Secondary | ICD-10-CM | POA: Diagnosis not present

## 2022-07-28 DIAGNOSIS — I358 Other nonrheumatic aortic valve disorders: Secondary | ICD-10-CM | POA: Diagnosis not present

## 2022-07-28 DIAGNOSIS — M1000 Idiopathic gout, unspecified site: Secondary | ICD-10-CM | POA: Diagnosis not present

## 2022-07-28 DIAGNOSIS — I5020 Unspecified systolic (congestive) heart failure: Secondary | ICD-10-CM | POA: Diagnosis not present

## 2022-07-28 DIAGNOSIS — I5030 Unspecified diastolic (congestive) heart failure: Secondary | ICD-10-CM | POA: Diagnosis not present

## 2022-07-28 DIAGNOSIS — N390 Urinary tract infection, site not specified: Secondary | ICD-10-CM | POA: Diagnosis not present

## 2022-07-28 DIAGNOSIS — I5031 Acute diastolic (congestive) heart failure: Secondary | ICD-10-CM | POA: Diagnosis not present

## 2022-07-28 DIAGNOSIS — J9601 Acute respiratory failure with hypoxia: Secondary | ICD-10-CM | POA: Diagnosis not present

## 2022-07-28 DIAGNOSIS — I509 Heart failure, unspecified: Principal | ICD-10-CM

## 2022-07-28 DIAGNOSIS — I132 Hypertensive heart and chronic kidney disease with heart failure and with stage 5 chronic kidney disease, or end stage renal disease: Secondary | ICD-10-CM | POA: Diagnosis not present

## 2022-07-28 DIAGNOSIS — I160 Hypertensive urgency: Secondary | ICD-10-CM | POA: Diagnosis present

## 2022-07-28 DIAGNOSIS — M549 Dorsalgia, unspecified: Secondary | ICD-10-CM | POA: Diagnosis present

## 2022-07-28 DIAGNOSIS — I5043 Acute on chronic combined systolic (congestive) and diastolic (congestive) heart failure: Secondary | ICD-10-CM | POA: Diagnosis not present

## 2022-07-28 DIAGNOSIS — Z79899 Other long term (current) drug therapy: Secondary | ICD-10-CM

## 2022-07-28 DIAGNOSIS — Z6837 Body mass index (BMI) 37.0-37.9, adult: Secondary | ICD-10-CM

## 2022-07-28 DIAGNOSIS — E1169 Type 2 diabetes mellitus with other specified complication: Secondary | ICD-10-CM | POA: Diagnosis not present

## 2022-07-28 DIAGNOSIS — G8929 Other chronic pain: Secondary | ICD-10-CM | POA: Diagnosis present

## 2022-07-28 DIAGNOSIS — I110 Hypertensive heart disease with heart failure: Secondary | ICD-10-CM | POA: Diagnosis not present

## 2022-07-28 DIAGNOSIS — I5032 Chronic diastolic (congestive) heart failure: Secondary | ICD-10-CM | POA: Diagnosis not present

## 2022-07-28 DIAGNOSIS — I200 Unstable angina: Secondary | ICD-10-CM | POA: Diagnosis not present

## 2022-07-28 DIAGNOSIS — I517 Cardiomegaly: Secondary | ICD-10-CM | POA: Diagnosis not present

## 2022-07-28 DIAGNOSIS — R627 Adult failure to thrive: Secondary | ICD-10-CM | POA: Diagnosis present

## 2022-07-28 DIAGNOSIS — F439 Reaction to severe stress, unspecified: Secondary | ICD-10-CM | POA: Diagnosis present

## 2022-07-28 DIAGNOSIS — N184 Chronic kidney disease, stage 4 (severe): Secondary | ICD-10-CM | POA: Diagnosis present

## 2022-07-28 DIAGNOSIS — I252 Old myocardial infarction: Secondary | ICD-10-CM

## 2022-07-28 DIAGNOSIS — M1990 Unspecified osteoarthritis, unspecified site: Secondary | ICD-10-CM | POA: Diagnosis not present

## 2022-07-28 DIAGNOSIS — B965 Pseudomonas (aeruginosa) (mallei) (pseudomallei) as the cause of diseases classified elsewhere: Secondary | ICD-10-CM | POA: Diagnosis not present

## 2022-07-28 DIAGNOSIS — Z4682 Encounter for fitting and adjustment of non-vascular catheter: Secondary | ICD-10-CM | POA: Diagnosis not present

## 2022-07-28 DIAGNOSIS — N185 Chronic kidney disease, stage 5: Secondary | ICD-10-CM | POA: Diagnosis not present

## 2022-07-28 DIAGNOSIS — Z9861 Coronary angioplasty status: Secondary | ICD-10-CM | POA: Diagnosis not present

## 2022-07-28 DIAGNOSIS — R918 Other nonspecific abnormal finding of lung field: Secondary | ICD-10-CM | POA: Diagnosis not present

## 2022-07-28 DIAGNOSIS — Z8249 Family history of ischemic heart disease and other diseases of the circulatory system: Secondary | ICD-10-CM

## 2022-07-28 DIAGNOSIS — Z7984 Long term (current) use of oral hypoglycemic drugs: Secondary | ICD-10-CM | POA: Diagnosis not present

## 2022-07-28 DIAGNOSIS — Z452 Encounter for adjustment and management of vascular access device: Secondary | ICD-10-CM | POA: Diagnosis not present

## 2022-07-28 DIAGNOSIS — E119 Type 2 diabetes mellitus without complications: Secondary | ICD-10-CM

## 2022-07-28 DIAGNOSIS — K5900 Constipation, unspecified: Secondary | ICD-10-CM | POA: Diagnosis present

## 2022-07-28 DIAGNOSIS — I5021 Acute systolic (congestive) heart failure: Secondary | ICD-10-CM | POA: Diagnosis not present

## 2022-07-28 DIAGNOSIS — Z808 Family history of malignant neoplasm of other organs or systems: Secondary | ICD-10-CM

## 2022-07-28 DIAGNOSIS — R2689 Other abnormalities of gait and mobility: Secondary | ICD-10-CM | POA: Diagnosis not present

## 2022-07-28 DIAGNOSIS — J810 Acute pulmonary edema: Secondary | ICD-10-CM | POA: Diagnosis not present

## 2022-07-28 DIAGNOSIS — I208 Other forms of angina pectoris: Secondary | ICD-10-CM | POA: Diagnosis not present

## 2022-07-28 DIAGNOSIS — R0602 Shortness of breath: Secondary | ICD-10-CM | POA: Diagnosis not present

## 2022-07-28 DIAGNOSIS — Z833 Family history of diabetes mellitus: Secondary | ICD-10-CM

## 2022-07-28 LAB — CBC WITH DIFFERENTIAL/PLATELET
Abs Immature Granulocytes: 0.04 10*3/uL (ref 0.00–0.07)
Basophils Absolute: 0 10*3/uL (ref 0.0–0.1)
Basophils Relative: 0 %
Eosinophils Absolute: 0.3 10*3/uL (ref 0.0–0.5)
Eosinophils Relative: 2 %
HCT: 25 % — ABNORMAL LOW (ref 39.0–52.0)
Hemoglobin: 8.2 g/dL — ABNORMAL LOW (ref 13.0–17.0)
Immature Granulocytes: 0 %
Lymphocytes Relative: 10 %
Lymphs Abs: 1.1 10*3/uL (ref 0.7–4.0)
MCH: 31.9 pg (ref 26.0–34.0)
MCHC: 32.8 g/dL (ref 30.0–36.0)
MCV: 97.3 fL (ref 80.0–100.0)
Monocytes Absolute: 1.1 10*3/uL — ABNORMAL HIGH (ref 0.1–1.0)
Monocytes Relative: 10 %
Neutro Abs: 9 10*3/uL — ABNORMAL HIGH (ref 1.7–7.7)
Neutrophils Relative %: 78 %
Platelets: 227 10*3/uL (ref 150–400)
RBC: 2.57 MIL/uL — ABNORMAL LOW (ref 4.22–5.81)
RDW: 16.4 % — ABNORMAL HIGH (ref 11.5–15.5)
WBC: 11.6 10*3/uL — ABNORMAL HIGH (ref 4.0–10.5)
nRBC: 0 % (ref 0.0–0.2)

## 2022-07-28 NOTE — ED Triage Notes (Signed)
Pt presents to ED with c/o chest pain and sob that started 2 days ago, pt says his cardiologist put him on Imdur about a month ago and it worked for a while, but then couple of days ago pain and sob returned.

## 2022-07-29 ENCOUNTER — Encounter (HOSPITAL_COMMUNITY): Payer: Self-pay | Admitting: Internal Medicine

## 2022-07-29 ENCOUNTER — Inpatient Hospital Stay (HOSPITAL_COMMUNITY): Payer: Medicare Other

## 2022-07-29 ENCOUNTER — Other Ambulatory Visit (HOSPITAL_COMMUNITY): Payer: Medicare Other

## 2022-07-29 DIAGNOSIS — I480 Paroxysmal atrial fibrillation: Secondary | ICD-10-CM | POA: Diagnosis present

## 2022-07-29 DIAGNOSIS — M1000 Idiopathic gout, unspecified site: Secondary | ICD-10-CM

## 2022-07-29 DIAGNOSIS — I2510 Atherosclerotic heart disease of native coronary artery without angina pectoris: Secondary | ICD-10-CM | POA: Diagnosis not present

## 2022-07-29 DIAGNOSIS — E7800 Pure hypercholesterolemia, unspecified: Secondary | ICD-10-CM

## 2022-07-29 DIAGNOSIS — J9601 Acute respiratory failure with hypoxia: Secondary | ICD-10-CM

## 2022-07-29 DIAGNOSIS — E1122 Type 2 diabetes mellitus with diabetic chronic kidney disease: Secondary | ICD-10-CM | POA: Diagnosis present

## 2022-07-29 DIAGNOSIS — F1120 Opioid dependence, uncomplicated: Secondary | ICD-10-CM | POA: Diagnosis present

## 2022-07-29 DIAGNOSIS — R0602 Shortness of breath: Secondary | ICD-10-CM

## 2022-07-29 DIAGNOSIS — Z9861 Coronary angioplasty status: Secondary | ICD-10-CM

## 2022-07-29 DIAGNOSIS — R778 Other specified abnormalities of plasma proteins: Secondary | ICD-10-CM

## 2022-07-29 DIAGNOSIS — E876 Hypokalemia: Secondary | ICD-10-CM

## 2022-07-29 DIAGNOSIS — D649 Anemia, unspecified: Secondary | ICD-10-CM | POA: Diagnosis not present

## 2022-07-29 DIAGNOSIS — D72829 Elevated white blood cell count, unspecified: Secondary | ICD-10-CM

## 2022-07-29 DIAGNOSIS — I429 Cardiomyopathy, unspecified: Secondary | ICD-10-CM | POA: Diagnosis not present

## 2022-07-29 DIAGNOSIS — Z7189 Other specified counseling: Secondary | ICD-10-CM | POA: Diagnosis not present

## 2022-07-29 DIAGNOSIS — R079 Chest pain, unspecified: Secondary | ICD-10-CM | POA: Diagnosis not present

## 2022-07-29 DIAGNOSIS — I5030 Unspecified diastolic (congestive) heart failure: Secondary | ICD-10-CM | POA: Diagnosis present

## 2022-07-29 DIAGNOSIS — Z6835 Body mass index (BMI) 35.0-35.9, adult: Secondary | ICD-10-CM

## 2022-07-29 DIAGNOSIS — I509 Heart failure, unspecified: Secondary | ICD-10-CM

## 2022-07-29 DIAGNOSIS — I132 Hypertensive heart and chronic kidney disease with heart failure and with stage 5 chronic kidney disease, or end stage renal disease: Secondary | ICD-10-CM | POA: Diagnosis present

## 2022-07-29 DIAGNOSIS — I5033 Acute on chronic diastolic (congestive) heart failure: Secondary | ICD-10-CM

## 2022-07-29 DIAGNOSIS — I200 Unstable angina: Secondary | ICD-10-CM

## 2022-07-29 DIAGNOSIS — I208 Other forms of angina pectoris: Secondary | ICD-10-CM | POA: Diagnosis not present

## 2022-07-29 DIAGNOSIS — I493 Ventricular premature depolarization: Secondary | ICD-10-CM

## 2022-07-29 DIAGNOSIS — N184 Chronic kidney disease, stage 4 (severe): Secondary | ICD-10-CM

## 2022-07-29 DIAGNOSIS — E1165 Type 2 diabetes mellitus with hyperglycemia: Secondary | ICD-10-CM | POA: Diagnosis present

## 2022-07-29 DIAGNOSIS — K219 Gastro-esophageal reflux disease without esophagitis: Secondary | ICD-10-CM

## 2022-07-29 DIAGNOSIS — I5031 Acute diastolic (congestive) heart failure: Secondary | ICD-10-CM | POA: Diagnosis not present

## 2022-07-29 DIAGNOSIS — I130 Hypertensive heart and chronic kidney disease with heart failure and stage 1 through stage 4 chronic kidney disease, or unspecified chronic kidney disease: Secondary | ICD-10-CM | POA: Diagnosis not present

## 2022-07-29 DIAGNOSIS — E669 Obesity, unspecified: Secondary | ICD-10-CM | POA: Diagnosis present

## 2022-07-29 DIAGNOSIS — I501 Left ventricular failure: Secondary | ICD-10-CM | POA: Diagnosis not present

## 2022-07-29 DIAGNOSIS — I5043 Acute on chronic combined systolic (congestive) and diastolic (congestive) heart failure: Secondary | ICD-10-CM | POA: Diagnosis not present

## 2022-07-29 DIAGNOSIS — N185 Chronic kidney disease, stage 5: Secondary | ICD-10-CM | POA: Diagnosis not present

## 2022-07-29 DIAGNOSIS — Z515 Encounter for palliative care: Secondary | ICD-10-CM | POA: Diagnosis not present

## 2022-07-29 DIAGNOSIS — I5020 Unspecified systolic (congestive) heart failure: Secondary | ICD-10-CM

## 2022-07-29 DIAGNOSIS — Z992 Dependence on renal dialysis: Secondary | ICD-10-CM | POA: Diagnosis not present

## 2022-07-29 DIAGNOSIS — R7881 Bacteremia: Secondary | ICD-10-CM | POA: Diagnosis not present

## 2022-07-29 DIAGNOSIS — I441 Atrioventricular block, second degree: Secondary | ICD-10-CM | POA: Diagnosis not present

## 2022-07-29 DIAGNOSIS — D509 Iron deficiency anemia, unspecified: Secondary | ICD-10-CM | POA: Diagnosis present

## 2022-07-29 DIAGNOSIS — I358 Other nonrheumatic aortic valve disorders: Secondary | ICD-10-CM | POA: Diagnosis present

## 2022-07-29 DIAGNOSIS — I248 Other forms of acute ischemic heart disease: Secondary | ICD-10-CM | POA: Diagnosis not present

## 2022-07-29 DIAGNOSIS — N186 End stage renal disease: Secondary | ICD-10-CM | POA: Diagnosis not present

## 2022-07-29 DIAGNOSIS — N179 Acute kidney failure, unspecified: Secondary | ICD-10-CM | POA: Diagnosis not present

## 2022-07-29 DIAGNOSIS — I5021 Acute systolic (congestive) heart failure: Secondary | ICD-10-CM | POA: Diagnosis not present

## 2022-07-29 DIAGNOSIS — I10 Essential (primary) hypertension: Secondary | ICD-10-CM

## 2022-07-29 DIAGNOSIS — I25119 Atherosclerotic heart disease of native coronary artery with unspecified angina pectoris: Secondary | ICD-10-CM | POA: Diagnosis present

## 2022-07-29 DIAGNOSIS — Z87891 Personal history of nicotine dependence: Secondary | ICD-10-CM | POA: Diagnosis not present

## 2022-07-29 DIAGNOSIS — E1140 Type 2 diabetes mellitus with diabetic neuropathy, unspecified: Secondary | ICD-10-CM | POA: Diagnosis present

## 2022-07-29 DIAGNOSIS — E1169 Type 2 diabetes mellitus with other specified complication: Secondary | ICD-10-CM

## 2022-07-29 DIAGNOSIS — N390 Urinary tract infection, site not specified: Secondary | ICD-10-CM | POA: Diagnosis not present

## 2022-07-29 DIAGNOSIS — X58XXXA Exposure to other specified factors, initial encounter: Secondary | ICD-10-CM | POA: Diagnosis present

## 2022-07-29 DIAGNOSIS — R1030 Lower abdominal pain, unspecified: Secondary | ICD-10-CM | POA: Diagnosis not present

## 2022-07-29 DIAGNOSIS — I959 Hypotension, unspecified: Secondary | ICD-10-CM | POA: Diagnosis not present

## 2022-07-29 DIAGNOSIS — Z7984 Long term (current) use of oral hypoglycemic drugs: Secondary | ICD-10-CM | POA: Diagnosis not present

## 2022-07-29 DIAGNOSIS — D631 Anemia in chronic kidney disease: Secondary | ICD-10-CM | POA: Diagnosis not present

## 2022-07-29 LAB — GLUCOSE, CAPILLARY
Glucose-Capillary: 178 mg/dL — ABNORMAL HIGH (ref 70–99)
Glucose-Capillary: 182 mg/dL — ABNORMAL HIGH (ref 70–99)
Glucose-Capillary: 162 mg/dL — ABNORMAL HIGH (ref 70–99)
Glucose-Capillary: 137 mg/dL — ABNORMAL HIGH (ref 70–99)

## 2022-07-29 LAB — BASIC METABOLIC PANEL
Anion gap: 10 (ref 5–15)
BUN: 79 mg/dL — ABNORMAL HIGH (ref 8–23)
CO2: 26 mmol/L (ref 22–32)
Calcium: 8.5 mg/dL — ABNORMAL LOW (ref 8.9–10.3)
Chloride: 105 mmol/L (ref 98–111)
Creatinine, Ser: 3.34 mg/dL — ABNORMAL HIGH (ref 0.61–1.24)
GFR, Estimated: 18 mL/min — ABNORMAL LOW (ref >60)
Glucose, Bld: 148 mg/dL — ABNORMAL HIGH (ref 70–99)
Potassium: 3.6 mmol/L (ref 3.5–5.1)
Sodium: 141 mmol/L (ref 135–145)

## 2022-07-29 LAB — BLOOD GAS, ARTERIAL
Acid-Base Excess: 3.1 mmol/L — ABNORMAL HIGH (ref 0.0–2.0)
Bicarbonate: 26.9 mmol/L (ref 20.0–28.0)
Drawn by: 30136
O2 Saturation: 98.5 %
Patient temperature: 37
pCO2 arterial: 37 mmHg (ref 32–48)
pH, Arterial: 7.47 — ABNORMAL HIGH (ref 7.35–7.45)
pO2, Arterial: 107 mmHg (ref 83–108)

## 2022-07-29 LAB — COMPREHENSIVE METABOLIC PANEL
ALT: 39 U/L (ref 0–44)
AST: 41 U/L (ref 15–41)
Albumin: 4.2 g/dL (ref 3.5–5.0)
Alkaline Phosphatase: 59 U/L (ref 38–126)
Anion gap: 11 (ref 5–15)
BUN: 74 mg/dL — ABNORMAL HIGH (ref 8–23)
CO2: 23 mmol/L (ref 22–32)
Calcium: 8.9 mg/dL (ref 8.9–10.3)
Chloride: 103 mmol/L (ref 98–111)
Creatinine, Ser: 3.38 mg/dL — ABNORMAL HIGH (ref 0.61–1.24)
GFR, Estimated: 18 mL/min — ABNORMAL LOW (ref >60)
Glucose, Bld: 170 mg/dL — ABNORMAL HIGH (ref 70–99)
Potassium: 3.2 mmol/L — ABNORMAL LOW (ref 3.5–5.1)
Sodium: 137 mmol/L (ref 135–145)
Total Bilirubin: 0.4 mg/dL (ref 0.3–1.2)
Total Protein: 7.6 g/dL (ref 6.5–8.1)

## 2022-07-29 LAB — URINALYSIS, ROUTINE W REFLEX MICROSCOPIC
Bacteria, UA: NONE SEEN
Bilirubin Urine: NEGATIVE
Glucose, UA: NEGATIVE mg/dL
Ketones, ur: NEGATIVE mg/dL
Leukocytes,Ua: NEGATIVE
Nitrite: NEGATIVE
Protein, ur: NEGATIVE mg/dL
Specific Gravity, Urine: 1.005 (ref 1.005–1.030)
pH: 7 (ref 5.0–8.0)

## 2022-07-29 LAB — TROPONIN I (HIGH SENSITIVITY)
Troponin I (High Sensitivity): 42 ng/L — ABNORMAL HIGH (ref <18)
Troponin I (High Sensitivity): 41 ng/L — ABNORMAL HIGH

## 2022-07-29 LAB — ECHOCARDIOGRAM LIMITED
Calc EF: 36.6 %
Height: 74 in
S' Lateral: 4.6 cm
Single Plane A2C EF: 39 %
Single Plane A4C EF: 30 %
Weight: 4331.6 oz

## 2022-07-29 LAB — BRAIN NATRIURETIC PEPTIDE: B Natriuretic Peptide: 651 pg/mL — ABNORMAL HIGH (ref 0.0–100.0)

## 2022-07-29 LAB — MRSA NEXT GEN BY PCR, NASAL: MRSA by PCR Next Gen: NOT DETECTED

## 2022-07-29 LAB — MAGNESIUM
Magnesium: 1.9 mg/dL (ref 1.7–2.4)
Magnesium: 2.2 mg/dL (ref 1.7–2.4)

## 2022-07-29 LAB — POTASSIUM: Potassium: 3.6 mmol/L (ref 3.5–5.1)

## 2022-07-29 MED ORDER — SODIUM CHLORIDE 0.9% FLUSH: 3.0000 mL | INTRAVENOUS | Status: DC | PRN

## 2022-07-29 MED ORDER — FENTANYL CITRATE (PF) 100 MCG/2ML IJ SOLN
INTRAMUSCULAR | Status: AC
  Administered 2022-07-29: 100 ug
  Filled 2022-07-29: qty 2

## 2022-07-29 MED ORDER — INSULIN ASPART 100 UNIT/ML IJ SOLN
0.0000 [IU] | Freq: Three times a day (TID) | INTRAMUSCULAR | Status: DC
  Administered 2022-07-29 (×2): 1 [IU] via SUBCUTANEOUS

## 2022-07-29 MED ORDER — MIDAZOLAM HCL 2 MG/2ML IJ SOLN
1.0000 mg | INTRAMUSCULAR | Status: DC | PRN
  Administered 2022-07-29 – 2022-07-30 (×6): 1 mg via INTRAVENOUS
  Filled 2022-07-29 (×6): qty 2

## 2022-07-29 MED ORDER — INSULIN ASPART 100 UNIT/ML IJ SOLN: 0.0000 [IU] | Freq: Every day | INTRAMUSCULAR | Status: DC

## 2022-07-29 MED ORDER — ACETAMINOPHEN 325 MG PO TABS
650.0000 mg | ORAL_TABLET | ORAL | Status: DC | PRN
Start: 2022-07-29 — End: 2022-07-29
  Filled 2022-07-29: qty 2

## 2022-07-29 MED ORDER — NITROGLYCERIN 0.4 MG SL SUBL
SUBLINGUAL_TABLET | SUBLINGUAL | Status: AC
  Filled 2022-07-29: qty 1

## 2022-07-29 MED ORDER — ONDANSETRON HCL 4 MG/2ML IJ SOLN
4.0000 mg | Freq: Four times a day (QID) | INTRAMUSCULAR | Status: DC | PRN
  Administered 2022-08-01 – 2022-08-02 (×4): 4 mg via INTRAVENOUS
  Filled 2022-07-29 (×4): qty 2

## 2022-07-29 MED ORDER — HYDRALAZINE HCL 25 MG PO TABS
100.0000 mg | ORAL_TABLET | Freq: Three times a day (TID) | ORAL | Status: DC
  Administered 2022-07-29 (×2): 100 mg via ORAL
  Filled 2022-07-29: qty 4
  Filled 2022-07-29: qty 2
  Filled 2022-07-29: qty 4
  Filled 2022-07-29: qty 2

## 2022-07-29 MED ORDER — FENTANYL CITRATE PF 50 MCG/ML IJ SOSY
25.0000 ug | PREFILLED_SYRINGE | Freq: Once | INTRAMUSCULAR | Status: AC
  Administered 2022-07-29: 25 ug via INTRAVENOUS
  Filled 2022-07-29: qty 1

## 2022-07-29 MED ORDER — SODIUM CHLORIDE 0.9% FLUSH
3.0000 mL | Freq: Two times a day (BID) | INTRAVENOUS | Status: DC
Start: 2022-07-29 — End: 2022-08-11
  Administered 2022-07-29 – 2022-08-10 (×25): 3 mL via INTRAVENOUS

## 2022-07-29 MED ORDER — ROCURONIUM BROMIDE 10 MG/ML (PF) SYRINGE
PREFILLED_SYRINGE | INTRAVENOUS | Status: AC
  Administered 2022-07-29: 70 mg
  Filled 2022-07-29: qty 10

## 2022-07-29 MED ORDER — FUROSEMIDE 10 MG/ML IJ SOLN
160.0000 mg | Freq: Four times a day (QID) | INTRAVENOUS | Status: DC
  Administered 2022-07-29 – 2022-07-31 (×8): 160 mg via INTRAVENOUS
  Filled 2022-07-29 (×14): qty 16

## 2022-07-29 MED ORDER — PRAVASTATIN SODIUM 40 MG PO TABS
40.0000 mg | ORAL_TABLET | Freq: Every evening | ORAL | Status: DC
  Administered 2022-07-29 – 2022-07-30 (×2): 40 mg
  Filled 2022-07-29 (×2): qty 1

## 2022-07-29 MED ORDER — ORAL CARE MOUTH RINSE: 15.0000 mL | OROMUCOSAL | Status: DC

## 2022-07-29 MED ORDER — CHLORHEXIDINE GLUCONATE CLOTH 2 % EX PADS
6.0000 | MEDICATED_PAD | Freq: Every day | CUTANEOUS | Status: DC
Start: 2022-07-29 — End: 2022-08-02
  Administered 2022-07-29 – 2022-08-02 (×5): 6 via TOPICAL

## 2022-07-29 MED ORDER — POTASSIUM CHLORIDE CRYS ER 20 MEQ PO TBCR
40.0000 meq | EXTENDED_RELEASE_TABLET | Freq: Once | ORAL | Status: AC
  Administered 2022-07-29: 40 meq via ORAL
  Filled 2022-07-29: qty 2

## 2022-07-29 MED ORDER — PANTOPRAZOLE 2 MG/ML SUSPENSION
40.0000 mg | Freq: Every day | ORAL | Status: DC
  Filled 2022-07-29: qty 20

## 2022-07-29 MED ORDER — ISOSORBIDE MONONITRATE ER 60 MG PO TB24
60.0000 mg | ORAL_TABLET | Freq: Two times a day (BID) | ORAL | Status: DC
  Administered 2022-07-29 (×2): 60 mg via ORAL
  Filled 2022-07-29 (×2): qty 1

## 2022-07-29 MED ORDER — PERFLUTREN LIPID MICROSPHERE
1.0000 mL | INTRAVENOUS | Status: DC | PRN
  Administered 2022-07-29: 5 mL via INTRAVENOUS
  Filled 2022-07-29: qty 10

## 2022-07-29 MED ORDER — ISOSORBIDE DINITRATE 20 MG PO TABS
40.0000 mg | ORAL_TABLET | Freq: Three times a day (TID) | ORAL | Status: DC
  Administered 2022-07-29 – 2022-07-30 (×4): 40 mg
  Filled 2022-07-29 (×4): qty 2

## 2022-07-29 MED ORDER — ALLOPURINOL 100 MG PO TABS
100.0000 mg | ORAL_TABLET | Freq: Every day | ORAL | Status: DC
  Administered 2022-07-29: 100 mg via ORAL
  Filled 2022-07-29: qty 1

## 2022-07-29 MED ORDER — ORAL CARE MOUTH RINSE: 15.0000 mL | OROMUCOSAL | Status: DC | PRN

## 2022-07-29 MED ORDER — AMLODIPINE BESYLATE 5 MG PO TABS: 10.0000 mg | ORAL_TABLET | Freq: Every evening | ORAL | Status: DC

## 2022-07-29 MED ORDER — ASPIRIN 81 MG PO TBEC
81.0000 mg | DELAYED_RELEASE_TABLET | Freq: Every day | ORAL | Status: DC
  Administered 2022-07-29: 81 mg via ORAL
  Filled 2022-07-29: qty 1

## 2022-07-29 MED ORDER — MAGNESIUM OXIDE -MG SUPPLEMENT 400 (240 MG) MG PO TABS
800.0000 mg | ORAL_TABLET | Freq: Once | ORAL | Status: AC
  Administered 2022-07-29: 800 mg via ORAL
  Filled 2022-07-29: qty 2

## 2022-07-29 MED ORDER — FUROSEMIDE 10 MG/ML IJ SOLN
40.0000 mg | Freq: Once | INTRAMUSCULAR | Status: AC
  Administered 2022-07-29: 40 mg via INTRAVENOUS
  Filled 2022-07-29: qty 4

## 2022-07-29 MED ORDER — INSULIN ASPART 100 UNIT/ML IJ SOLN
0.0000 [IU] | INTRAMUSCULAR | Status: DC
  Administered 2022-07-29: 1 [IU] via SUBCUTANEOUS
  Administered 2022-07-29: 2 [IU] via SUBCUTANEOUS
  Administered 2022-07-30: 1 [IU] via SUBCUTANEOUS
  Administered 2022-07-30: 2 [IU] via SUBCUTANEOUS
  Administered 2022-07-30 – 2022-07-31 (×4): 1 [IU] via SUBCUTANEOUS
  Administered 2022-07-31: 7 [IU] via SUBCUTANEOUS
  Administered 2022-07-31: 1 [IU] via SUBCUTANEOUS

## 2022-07-29 MED ORDER — CARVEDILOL 3.125 MG PO TABS
6.2500 mg | ORAL_TABLET | Freq: Two times a day (BID) | ORAL | Status: DC
  Administered 2022-07-29 – 2022-07-31 (×4): 6.25 mg
  Filled 2022-07-29 (×4): qty 2

## 2022-07-29 MED ORDER — DEXMEDETOMIDINE HCL IN NACL 400 MCG/100ML IV SOLN
0.0000 ug/kg/h | INTRAVENOUS | Status: DC
  Administered 2022-07-29: 0.4 ug/kg/h via INTRAVENOUS
  Filled 2022-07-29: qty 200
  Filled 2022-07-29: qty 100

## 2022-07-29 MED ORDER — CARVEDILOL 3.125 MG PO TABS
6.2500 mg | ORAL_TABLET | Freq: Two times a day (BID) | ORAL | Status: DC
  Administered 2022-07-29: 6.25 mg via ORAL
  Filled 2022-07-29: qty 2

## 2022-07-29 MED ORDER — CLONIDINE HCL 0.1 MG PO TABS
0.1000 mg | ORAL_TABLET | Freq: Three times a day (TID) | ORAL | Status: DC
  Administered 2022-07-29 (×2): 0.1 mg via ORAL
  Filled 2022-07-29 (×2): qty 1

## 2022-07-29 MED ORDER — FUROSEMIDE 10 MG/ML IJ SOLN
40.0000 mg | Freq: Two times a day (BID) | INTRAMUSCULAR | Status: DC
  Administered 2022-07-29: 40 mg via INTRAVENOUS
  Filled 2022-07-29: qty 4

## 2022-07-29 MED ORDER — MIDAZOLAM HCL 2 MG/2ML IJ SOLN
INTRAMUSCULAR | Status: AC
  Administered 2022-07-29: 2 mg
  Filled 2022-07-29: qty 2

## 2022-07-29 MED ORDER — HEPARIN SODIUM (PORCINE) 5000 UNIT/ML IJ SOLN
5000.0000 [IU] | Freq: Three times a day (TID) | INTRAMUSCULAR | Status: DC
  Administered 2022-07-29 – 2022-08-10 (×33): 5000 [IU] via SUBCUTANEOUS
  Filled 2022-07-29 (×36): qty 1

## 2022-07-29 MED ORDER — ETOMIDATE 2 MG/ML IV SOLN
INTRAVENOUS | Status: AC
  Administered 2022-07-29: 10 mg
  Filled 2022-07-29: qty 10

## 2022-07-29 MED ORDER — POLYETHYLENE GLYCOL 3350 17 G PO PACK
17.0000 g | PACK | Freq: Every day | ORAL | Status: DC
  Administered 2022-07-29: 17 g
  Filled 2022-07-29: qty 1

## 2022-07-29 MED ORDER — HYDRALAZINE HCL 20 MG/ML IJ SOLN: 10.0000 mg | INTRAMUSCULAR | Status: DC | PRN

## 2022-07-29 MED ORDER — FENTANYL 2500MCG IN NS 250ML (10MCG/ML) PREMIX INFUSION
25.0000 ug/h | INTRAVENOUS | Status: DC
  Administered 2022-07-29: 25 ug/h via INTRAVENOUS
  Administered 2022-07-30 (×2): 175 ug/h via INTRAVENOUS
  Filled 2022-07-29 (×3): qty 250

## 2022-07-29 MED ORDER — ORAL CARE MOUTH RINSE
15.0000 mL | OROMUCOSAL | Status: DC
  Administered 2022-07-29 – 2022-07-31 (×23): 15 mL via OROMUCOSAL

## 2022-07-29 MED ORDER — POLYETHYLENE GLYCOL 3350 17 G PO PACK
17.0000 g | PACK | Freq: Every day | ORAL | Status: DC
Start: 2022-07-30 — End: 2022-07-31
  Administered 2022-07-30 – 2022-07-31 (×2): 17 g
  Filled 2022-07-29 (×2): qty 1

## 2022-07-29 MED ORDER — HYDRALAZINE HCL 25 MG PO TABS
100.0000 mg | ORAL_TABLET | Freq: Three times a day (TID) | ORAL | Status: DC
  Administered 2022-07-29: 100 mg
  Filled 2022-07-29: qty 4

## 2022-07-29 MED ORDER — METOLAZONE 5 MG PO TABS
5.0000 mg | ORAL_TABLET | Freq: Once | ORAL | Status: AC
  Administered 2022-07-29: 5 mg via ORAL
  Filled 2022-07-29: qty 1

## 2022-07-29 MED ORDER — CLONIDINE HCL 0.1 MG PO TABS
0.1000 mg | ORAL_TABLET | Freq: Three times a day (TID) | ORAL | Status: DC
Start: 2022-07-29 — End: 2022-07-30
  Administered 2022-07-29: 0.1 mg
  Filled 2022-07-29: qty 1

## 2022-07-29 MED ORDER — PRAVASTATIN SODIUM 40 MG PO TABS: 40.0000 mg | ORAL_TABLET | Freq: Every evening | ORAL | Status: DC

## 2022-07-29 MED ORDER — SODIUM CHLORIDE 0.9 % IV SOLN: 250.0000 mL | INTRAVENOUS | Status: DC | PRN

## 2022-07-29 MED ORDER — ALLOPURINOL 100 MG PO TABS
100.0000 mg | ORAL_TABLET | Freq: Every day | ORAL | Status: DC
  Administered 2022-07-30 – 2022-07-31 (×2): 100 mg
  Filled 2022-07-29 (×2): qty 1

## 2022-07-29 MED ORDER — FENTANYL BOLUS VIA INFUSION
25.0000 ug | INTRAVENOUS | Status: DC | PRN
  Administered 2022-07-30: 50 ug via INTRAVENOUS

## 2022-07-29 MED ORDER — ACETAMINOPHEN 160 MG/5ML PO SOLN
650.0000 mg | ORAL | Status: DC | PRN
  Administered 2022-07-31 – 2022-08-01 (×4): 650 mg
  Filled 2022-07-29 (×5): qty 20.3

## 2022-07-29 MED ORDER — DOCUSATE SODIUM 50 MG/5ML PO LIQD
100.0000 mg | Freq: Two times a day (BID) | ORAL | Status: DC
Start: 2022-07-29 — End: 2022-07-31
  Administered 2022-07-30 – 2022-07-31 (×3): 100 mg
  Filled 2022-07-29 (×3): qty 10

## 2022-07-29 MED ORDER — DOCUSATE SODIUM 50 MG/5ML PO LIQD
100.0000 mg | Freq: Two times a day (BID) | ORAL | Status: DC
  Administered 2022-07-29: 100 mg
  Filled 2022-07-29: qty 10

## 2022-07-29 MED ORDER — POTASSIUM CHLORIDE CRYS ER 20 MEQ PO TBCR
20.0000 meq | EXTENDED_RELEASE_TABLET | Freq: Once | ORAL | Status: AC
  Administered 2022-07-29: 20 meq via ORAL
  Filled 2022-07-29: qty 1

## 2022-07-29 MED ORDER — ALBUMIN HUMAN 25 % IV SOLN
12.5000 g | Freq: Once | INTRAVENOUS | Status: AC
Start: 2022-07-29 — End: 2022-07-30
  Administered 2022-07-29: 12.5 g via INTRAVENOUS
  Filled 2022-07-29: qty 50

## 2022-07-29 MED ORDER — PANTOPRAZOLE 2 MG/ML SUSPENSION
40.0000 mg | Freq: Every day | ORAL | Status: DC
  Administered 2022-07-30 – 2022-07-31 (×2): 40 mg
  Filled 2022-07-29 (×2): qty 20

## 2022-07-29 MED ORDER — PANTOPRAZOLE SODIUM 40 MG PO TBEC
40.0000 mg | DELAYED_RELEASE_TABLET | Freq: Two times a day (BID) | ORAL | Status: DC
Start: 2022-07-29 — End: 2022-07-29
  Administered 2022-07-29 (×2): 40 mg via ORAL
  Filled 2022-07-29 (×2): qty 1

## 2022-07-29 MED ORDER — FENTANYL CITRATE PF 50 MCG/ML IJ SOSY: 25.0000 ug | PREFILLED_SYRINGE | INTRAMUSCULAR | Status: DC | PRN

## 2022-07-29 NOTE — Significant Event (Signed)
CC Patient SOB, Increased work of breathing  Assessment :  Patient diaphoretic, using accessory muscles to breath, audible rales,  on NRB mask,  Sat 84%   BP 145/95  HR 102   Intervention: Move to ICU, BiPap

## 2022-07-29 NOTE — Progress Notes (Signed)
PROGRESS NOTE   [name]  ERD:[id number] DOB: 07-15-1946 DOA: 07/28/2022 PCP: Redmond School, MD   Chief Complaint  Patient presents with   Chest Pain    sob   Level of care: Stepdown  Brief Admission History:   76 y.o. male with medical history significant of hypertension, hyperlipidemia, diastolic heart failure, CAD s/p stent, CKD stage IV, and obesity who presents with complaints of shortness of breath and chest pain starting 2 days ago.  Chest pain is substernal and feels like pressure. He chronically has orthopnea and has a bed that he always sleeps sitting up.  He has been feeling short of breath even when he is not doing anything which is not normal for him.  Denies having any fever, nausea, vomiting, diarrhea, abdominal pain, or diaphoresis.   Patient reports that he is followed by Dr. Harl Bowie of cardiology and at the end of June had been recommended to increase his Imdur to 60 mg twice daily.  He states that this initially helped decrease symptoms of chest pain.  Then he was told to start taking furosemide 40 mg twice daily by his nephrologist Dr. Theador Hawthorne on 6/12 and was started on torsemide 100 mg daily.  Since doing that patient reports that he has been swelling more.  He developed a blister on his left leg due to the swelling and he has been putting ointment on it.  Denies having any redness or erythema from the wound.   Upon admission into the emergency patient was seen to be afebrile with blood pressures 140/82 to 158/73 and all other vital signs maintained.  Labs significant for WBC 11.6, hemoglobin 8.2, potassium 3.2, BUN 74, creatinine 3.38, glucose 170, BNP 651, and high-sensitivity troponin 42. Chest x-ray showed cardiomegaly with vascular congestion.  Patient has been given Lasix 40 mg IV, potassium chloride 40 mEq, and magnesium oxide 800 mg p.o.  07/29/2022: Pt decompensated on med surg unit and rapid response called and patient placed on bipap and transferred to stepdown  ICU. Cardiology and nephrology consulted and patient started on maximal dose IV lasix by nephrology.  Pt noted to be in atrial fibrillation.  Family was called by MD and updated with change in status and treatment plan.     Assessment and Plan:  Acute respiratory failure with hypoxia  - secondary to acute heart failure exacerbation and atrial fibrillation - continue bipap and transferred to stepdown ICU - IV lasix maximal doses ordered per nephrology - cardiology consultation  - nephrology consultation  Acute HFpEF - pt is volume overloaded from poor renal function and possibly change to diuretic - he is massively volume overloaded - high dose IV lasix ordered by nephrology, follow intake and output and weights closely  Hypertensive urgency - resume home meds and IV hydralazine ordered - IV lasix high dose for diuresis should also help with blood pressures  Chest pain / longstanding angina  - appreciate cardiology consult - he is being treated medically  - not received cath due to CKD  - following closely   Leukocytosis  - stress reaction likely  - follow, no s/s of infection found at this time   Hypokalemia  - repleted in ED, follow and check Mg in AM   Essential hypertension  - resumed home carvedilol, clonidine, isosorbide, hydralazine  Stage IV CKD - appreciate nephrology consultation  - follow daily BMP   Diabetes mellitus, type 2, controlled with renal complications - continue close CBG monitoring and SSI coverage  CBG (  last 3)  Recent Labs    07/29/22 0710 07/29/22 1127  GLUCAP 178* 182*   Atrial Fibrillation  - likely exacerbated his acute respiratory failure this morning - see cardiology team recommendations - resume carvedilol for rate control  - further recommendations to follow as we correct his respiratory failure  Anemia of chronic renal disease - low threshold to transfuse for Hg<8 as it contributes to chest pain and respiratory distress   GERD   - protonix for GI protection   Hyperlipidemia - resume home pravastatin   Gout  - continue allopurinolol  - watch very closely in setting of aggressive diuresis   Obesity  -  exacerbated by fluid overload, diuresing per above   Left leg wound - continue local wound care - working on diuresing aggressively as above   DVT prophylaxis: sq heparin  Code Status: Full  Family Communication: t/c to nephew 8/7 Disposition: Status is: Inpatient Remains inpatient appropriate because: IV lasix diuresis, bipap therapy, intensity    Consultants:  Cardiology Nephrology  Procedures:  Bipap  Antimicrobials:    Subjective: Pt having chest pain and shortness of breath.    Objective: Vitals:   07/29/22 1009 07/29/22 1100 07/29/22 1128 07/29/22 1200  BP: (!) 168/79 (!) 159/94  (!) 149/68  Pulse:  91 92 91  Resp:   (!) 37 (!) 35  Temp:   98.9 F (37.2 C)   TempSrc:   Axillary   SpO2:  94% 94% 92%  Weight:      Height:        Intake/Output Summary (Last 24 hours) at 07/29/2022 1308 Last data filed at 07/29/2022 1013 Gross per 24 hour  Intake 6 ml  Output 1450 ml  Net -1444 ml   Filed Weights   07/28/22 2325 07/28/22 2326 07/29/22 0852  Weight: 120.2 kg 126.4 kg 122.8 kg   Examination:  General exam: Pt on bipap therapy speaking short sentences.  Respiratory system: diffuse crackles and rales, moderate increased work of breathing.  Cardiovascular system: irregularly irregular, 3+ pitting lower extremity and pedal edema. Gastrointestinal system: Abdomen is distended, soft and nontender. No organomegaly or masses felt. Normal bowel sounds heard. Central nervous system: Alert and oriented. No focal neurological deficits. Extremities: 3+ pitting edema with weeping BLEs.  Skin: No rashes, lesions or ulcers. Psychiatry: Judgement and insight appear normal. Mood & affect appropriate.   Data Reviewed: I have personally reviewed following labs and imaging studies  CBC: Recent  Labs  Lab 07/28/22 2328  WBC 11.6*  NEUTROABS 9.0*  HGB 8.2*  HCT 25.0*  MCV 97.3  PLT 371    Basic Metabolic Panel: Recent Labs  Lab 07/28/22 2328 07/29/22 0213  NA 137  --   K 3.2*  --   CL 103  --   CO2 23  --   GLUCOSE 170*  --   BUN 74*  --   CREATININE 3.38*  --   CALCIUM 8.9  --   MG  --  1.9    CBG: Recent Labs  Lab 07/29/22 0710 07/29/22 1127  GLUCAP 178* 182*    No results found for this or any previous visit (from the past 240 hour(s)).   Radiology Studies: DG Chest Portable 1 View  Result Date: 07/28/2022 CLINICAL DATA:  Shortness of breath, chest pain EXAM: PORTABLE CHEST 1 VIEW COMPARISON:  05/29/2022 FINDINGS: Cardiomegaly, vascular congestion. No confluent opacities, effusions or edema. No acute bony abnormality. IMPRESSION: Cardiomegaly, vascular congestion. Electronically Signed   By:  Rolm Baptise M.D.   On: 07/28/2022 23:46    Scheduled Meds:  allopurinol  100 mg Oral Daily   amLODipine  10 mg Oral QPM   aspirin EC  81 mg Oral Daily   carvedilol  6.25 mg Oral BID WC   Chlorhexidine Gluconate Cloth  6 each Topical Q0600   cloNIDine  0.1 mg Oral TID   heparin  5,000 Units Subcutaneous Q8H   hydrALAZINE  100 mg Oral TID   insulin aspart  0-5 Units Subcutaneous QHS   insulin aspart  0-6 Units Subcutaneous TID WC   isosorbide mononitrate  60 mg Oral BID   nitroGLYCERIN       pantoprazole  40 mg Oral BID   pravastatin  40 mg Oral QPM   sodium chloride flush  3 mL Intravenous Q12H   Continuous Infusions:  sodium chloride     furosemide       LOS: 0 days   Critical Care Procedure Note Authorized and Performed by: Murvin Natal MD  Total Critical Care time:  55 mins Due to a high probability of clinically significant, life threatening deterioration, the patient required my highest level of preparedness to intervene emergently and I personally spent this critical care time directly and personally managing the patient.  This critical care time  included obtaining a history; examining the patient, pulse oximetry; ordering and review of studies; arranging urgent treatment with development of a management plan; evaluation of patient's response of treatment; frequent reassessment; and discussions with other providers.  This critical care time was performed to assess and manage the high probability of imminent and life threatening deterioration that could result in multi-organ failure.  It was exclusive of separately billable procedures and treating other patients and teaching time.    Irwin Brakeman, MD How to contact the [facility] Attending or Consulting provider [place] or covering provider during after hours [place], for this patient?  Check the care team in [facility] and look for a) attending/consulting [facility] provider listed and b) the [facility] team listed Log into [email or web address] and use [facility] universal password to access. If you do not have the password, please contact the hospital operator. Locate the [facility] provider you are looking for under [facility] and page to a number that you can be directly reached. If you still have difficulty reaching the provider, please page the [facility] (Director on Call) for the Hospitalists listed on amion for assistance.  07/29/2022, 1:08 PM

## 2022-07-29 NOTE — Procedures (Signed)
Intubation Procedure Note  [name]  [id number]  [birth year]  Date:07/29/22  Time:3:08 PM   Provider Performing:Thania Woodlief    Procedure: Intubation (31500)  Indication(s) Respiratory Failure  Consent Risks of the procedure as well as the alternatives and risks of each were explained to the patient and/or caregiver.  Consent for the procedure was obtained and is signed in the bedside chart   Anesthesia Etomidate, Versed, Fentanyl, and Rocuronium   Time Out Verified patient identification, verified procedure, site/side was marked, verified correct patient position, special equipment/implants available, medications/allergies/relevant history reviewed, required imaging and test results available.   Sterile Technique Usual hand hygeine, masks, and gloves were used   Procedure Description Patient positioned in bed supine.  Sedation given as noted above.  Patient was intubated with endotracheal tube using Glidescope.  View was Grade 2 only posterior commissure .  Number of attempts was 1.  Colorimetric CO2 detector was consistent with tracheal placement.  Inserted #8 ETT to 24 cm at lip.   Complications/Tolerance None; patient tolerated the procedure well. Chest X-ray is ordered to verify placement.   EBL Minimal   Specimen(s) None  [name] Mires, MD Felicity Pager - [phone] 07/29/2022, 3:08 PM

## 2022-07-29 NOTE — H&P (Addendum)
History and Physical    Patient: [name] GBT:[id number] DOB: March 31, 1946 DOA: 07/28/2022 DOS: the patient was seen and examined on 07/29/2022 PCP: Redmond School, MD  Patient coming from: Home  Chief Complaint:  Chief Complaint  Patient presents with   Chest Pain    sob   HPI: [name] is a 76 y.o. male with medical history significant of hypertension, hyperlipidemia, diastolic heart failure, CAD s/p stent, CKD stage IV, and obesity who presents with complaints of shortness of breath and chest pain starting 2 days ago.  Chest pain is substernal and feels like pressure. He chronically has orthopnea and has a bed that he always sleeps sitting up.  He has been feeling short of breath even when he is not doing anything which is not normal for him.  Denies having any fever, nausea, vomiting, diarrhea, abdominal pain, or diaphoresis.  Patient reports that he is followed by Dr. Harl Bowie of cardiology and at the end of June had been recommended to increase his Imdur to 60 mg twice daily.  He states that this initially helped decrease symptoms of chest pain.  Then he was told to start taking furosemide 40 mg twice daily by his nephrologist Dr. Theador Hawthorne on 6/12 and was started on torsemide 100 mg daily.  Since doing that patient reports that he has been swelling more.  He developed a blister on his left leg due to the swelling and he has been putting ointment on it.  Denies having any redness or erythema from the wound.  Upon admission into the emergency patient was seen to be afebrile with blood pressures 140/82 to 158/73 and all other vital signs maintained.  Labs significant for WBC 11.6, hemoglobin 8.2, potassium 3.2, BUN 74, creatinine 3.38, glucose 170, BNP 651, and high-sensitivity troponin 42. Chest x-ray showed cardiomegaly with vascular congestion.  Patient has been given Lasix 40 mg IV, potassium chloride 40 mEq, and magnesium oxide 800 mg p.o.   Review of Systems: As mentioned in the  history of present illness. All other systems reviewed and are negative. Past Medical History:  Diagnosis Date   Anginal pain ([place])    Anxiety    Benign prostatic hypertrophy    Nocturia   CAD (coronary artery disease)    a.  NSTEMI 10/2012 s/p DES to LAD & DES to 1st diagonal with residual diffuse nonobstructive dz in LCx/RCA    Chronic kidney disease, stage 3, mod decreased GFR (HCC)    Creatinine of 1.5 in 03/2009, STAGE 4 now   Degenerative joint disease    Depression    hx of   Diabetes mellitus, type II ([place]) 71/05/2693   Diastolic heart failure    Pedal edema, "a long time ago"   Erectile dysfunction    Flu 2013   hx of   GERD (gastroesophageal reflux disease)    Gout    Hemorrhoids    High cholesterol    Hypertension    Exercise induced   Myocardial infarction ([place])    Neuropathy    bilateral legs   NSVT (nonsustained ventricular tachycardia) ([place])    Exercise induced   Obesity    Tobacco abuse, 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, quit 1994   Tubular adenoma 2014   Vertigo    everyday   Past Surgical History:  Procedure Laterality Date   BACK SURGERY  2007   x2   BIOPSY  02/05/2021   Procedure: BIOPSY;  Surgeon: Daneil Dolin, MD;  Location: [facility];  Service: Endoscopy;;   CARDIAC CATHETERIZATION     with stent placement   COLONOSCOPY WITH ESOPHAGOGASTRODUODENOSCOPY (EGD) N/A 11/23/2013   Dr. Oneida Alar- TCS= left sided colitis and mild proctitis likely due to ischemic colitis, moderate internal hemorrhoids, small nodule in the rectum, bx=tubular adenoma, EGD=-mild non-erosive gastritis   COLONOSCOPY WITH PROPOFOL N/A 07/24/2020   hemorrhoids,  rectosigmoid lipoma, small adenoma removed.   CORONARY ANGIOPLASTY     ESOPHAGOGASTRODUODENOSCOPY (EGD) WITH PROPOFOL N/A 02/05/2021   Normal esophagus, erythematous mucosa in entire stomach, no obvious ulcer, s/p biopsy. Normal duodenum. Negative H.pylori.    HERNIA REPAIR     JOINT REPLACEMENT     LEFT HEART  CATHETERIZATION WITH CORONARY ANGIOGRAM N/A 11/16/2012   Procedure: LEFT HEART CATHETERIZATION WITH CORONARY ANGIOGRAM;  Surgeon: Sherren Mocha, MD;  Location: [facility] CATH LAB;  Service: Cardiovascular;  Laterality: N/A;   PERCUTANEOUS CORONARY STENT INTERVENTION (PCI-S)  11/16/2012   Procedure: PERCUTANEOUS CORONARY STENT INTERVENTION (PCI-S);  Surgeon: Sherren Mocha, MD;  Location[facility] C[facility] LAB;  Service: Cardiovascular;;   POLYPECTOMY  07/24/2020   Procedure: POLYPECTOMY;  Surgeon: Daneil Dolin, MD;  Location: [facility];  Service: Endoscopy;;   SPINAL CORD STIMULATOR INSERTION N/A 06/10/2022   Procedure: Spinal cord stimulator placement;  Surgeon: Newman Pies, MD;  Location: [facility];  Service: Neurosurgery;  Laterality: N/A;  RM 19/ to follow   THUMB AMPUTATION Left    50 years ago   TOTAL KNEE ARTHROPLASTY Right 11/15/2013   Procedure: RIGHT TOTAL KNEE ARTHROPLASTY;  Surgeon: Mauri Pole, MD;  Location: [facility];  Service: Orthopedics;  Laterality: Right;   TOTAL KNEE ARTHROPLASTY Left 03/07/2015   Procedure: LEFT TOTAL KNEE ARTHROPLASTY;  Surgeon: Paralee Cancel, MD;  Location: [facility];  Service: Orthopedics;  Laterality: Left;   Social History:  reports that he quit smoking about 29 years ago. His smoking use included cigarettes. He started smoking about 57 years ago. He has a 30.00 pack-year smoking history. He has never used smokeless tobacco. He reports that he does not drink alcohol and does not use drugs.  Allergies  Allergen Reactions   Metformin Nausea And Vomiting   Niacin    Niacin And Related Other (See Comments)    Reaction: flushing and burning side effects even with extended release    Family History  Problem Relation Age of Onset   Hypertension Other    Cancer Other    Heart disease Other    Diabetes Other    Hypertension Mother    Cancer Father        brain   CAD Brother    Colon cancer Neg Hx     Prior to Admission medications   Medication Sig Start  Date End Date Taking? Authorizing Provider  isosorbide mononitrate (IMDUR) 60 MG 24 hr tablet Take 1 tablet (60 mg total) by mouth 2 (two) times daily. 06/26/22 06/21/23 Yes BranchAlphonse Guild, MD  acarbose (PRECOSE) 50 MG tablet Take 50 mg by mouth in the morning, at noon, and at bedtime.  10/20/17   [provider]  allopurinol (ZYLOPRIM) 100 MG tablet Take 100 mg by mouth daily. 08/17/20   [provider]  amLODipine (NORVASC) 10 MG tablet Take 10 mg by mouth every evening.     [provider]  aspirin EC 81 MG tablet Take 1 tablet (81 mg total) by mouth daily. 09/14/19   Herminio Commons, MD  calcitRIOL (ROCALTROL) 0.25 MCG capsule Take 0.25  mcg by mouth every Sunday.  12/13/16   [provider]  carboxymethylcellulose (REFRESH PLUS) 0.5 % SOLN Place 1 drop into both eyes daily as needed (dry eyes).    [provider]  carvedilol (COREG) 6.25 MG tablet Take 6.25 mg by mouth 2 (two) times daily with a meal. 04/18/22 04/18/23  [provider]  cholecalciferol (VITAMIN D) 1000 units tablet Take 1,000 Units daily by mouth.    [provider]  cloNIDine (CATAPRES) 0.1 MG tablet Take 0.1 mg by mouth 3 (three) times daily. 02/15/20   [provider]  colchicine 0.6 MG tablet     [provider]  dicyclomine (BENTYL) 10 MG capsule     [provider]  diphenoxylate-atropine (LOMOTIL) 2.5-0.025 MG tablet Take 2 tablets by mouth 4 (four) times daily as needed. 06/18/22   [provider]  epoetin alfa (EPOGEN) 3000 UNIT/ML injection Inject into the skin. 07/03/22   [provider]  erythromycin ophthalmic ointment APPLY 1 CM RIBBON INTO THE LOWER CONJUNCTIVAL SAC(S) IN THE AFFECTED EYE(S) BY OPHTHALMIC ROUTE 3 TIMES PER DAY FOR ONE WEEK    [provider]  furosemide (LASIX) 40 MG tablet Take 1 tablet (40 mg total) by mouth daily. Patient taking differently: Take 40 mg by mouth 2 (two) times  daily. 05/11/22   Pokhrel, Corrie Mckusick, MD  gabapentin (NEURONTIN) 300 MG capsule Take 300 mg by mouth 3 (three) times daily.  10/19/19   [provider]  hydrALAZINE (APRESOLINE) 100 MG tablet Take 200 mg by mouth 2 (two) times daily. 01/24/22   [provider]  HYDROcodone-acetaminophen (NORCO) 10-325 MG tablet Take 1-2 tablets by mouth 4 (four) times daily as needed for pain. 05/27/22   [provider]  insulin NPH Human (NOVOLIN N) 100 UNIT/ML injection Inject 0.18 mLs (18 Units total) into the skin every morning. Ands syringes 1/day Patient taking differently: Inject 20 Units into the skin daily as needed (if CBG is high). Per sliding scale: not provided 06/27/21   Renato Shin, MD  lactulose (CONSTULOSE) 10 GM/15ML solution     [provider]  LORazepam (ATIVAN) 0.5 MG tablet     [provider]  losartan (COZAAR) 25 MG tablet     [provider]  lubiprostone (AMITIZA) 8 MCG capsule Take 8 mcg by mouth daily as needed for constipation.    [provider]  meclizine (ANTIVERT) 25 MG tablet Take 25 mg by mouth 3 (three) times daily as needed for dizziness.    [provider]  mupirocin ointment (BACTROBAN) 2 % SMARTSIG:sparingly Topical 3 Times Daily 07/10/22   [provider]  nitroGLYCERIN (NITROSTAT) 0.4 MG SL tablet Place 1 tablet (0.4 mg total) under the tongue every 5 (five) minutes as needed for chest pain. PLACE ONE TABLET UNDER THE TONGUE EVERY 5 MINUTES FOR 3 DOSES AS NEEDED Strength: 0.4 mg 02/07/22   Arnoldo Lenis, MD  ondansetron (ZOFRAN) 4 MG tablet TAKE ONE TABLET (4MG TOTAL) BY MOUTH EVERY 8 HOURS AS NEEDED FOR NAUSEA OR VOMITING 04/24/21   Annitta Needs, NP  pantoprazole (PROTONIX) 40 MG tablet TAKE ONE TABLET (40MG TOTAL) BY MOUTH TWO TIMES DAILY BEFORE A MEAL Patient taking differently: Take 40 mg by mouth 2 (two) times daily. 05/11/21   Erenest Rasher, PA-C  pravastatin (PRAVACHOL) 40 MG tablet Take 1  tablet (40 mg total) by mouth every evening. 07/31/21 02/09/28  Imogene Burn, PA-C  RESTASIS 0.05 % ophthalmic emulsion 1  drop 2 (two) times daily. 06/19/22   [provider]  silver sulfADIAZINE (SSD) 1 % cream     [provider]  sitaGLIPtin (JANUVIA) 25 MG tablet     [provider]  tiZANidine (ZANAFLEX) 4 MG tablet     [provider]  TRUE METRIX BLOOD GLUCOSE TEST test strip USE TO TEST BLOOD SUGAR UP TO TWICE DAILY OR AS DIRECTED 08/31/21   Renato Shin, MD    Physical Exam: Vitals:   07/28/22 2325 07/28/22 2326 07/29/22 0000  BP:  (!) 158/73 (!) 140/82  Pulse:  85 83  Resp:  18 19  Temp:  (!) 97.5 F (36.4 C)   TempSrc:  Oral   SpO2:  94% 92%  Weight: 120.2 kg 126.4 kg   Height: 6' 2"  (1.88 m)      Constitutional: Elderly male currently in some discomfort Eyes: PERRL, lids and conjunctivae normal ENMT: Mucous membranes are moist.   Neck: normal, supple, JVD present. Respiratory: Normal respiratory effort with crackles appreciated in lower lung fields.  Patient maintaining O2 saturations currently on room air and able to talk in fairly complete sentences. Cardiovascular: Regular rate and rhythm with intermittent extra beats.  Bilateral lower extremity edema present. Abdomen: no tenderness, no masses palpated. No  Bowel sounds positive.  Musculoskeletal: no clubbing / cyanosis. No joint deformity upper and lower extremities. Good ROM, no contractures. Normal muscle tone.  Skin: Wound present on the left shin as seen below.  Neurologic: CN 2-12 grossly intact.  Strength 5/5 in all 4.  Psychiatric: Normal judgment and insight. Alert and oriented x 3. Normal mood.   Data Reviewed:  Sinus rhythm at 82 bpm with first-degree heart block and frequent PVCs.  Reviewed labs, imaging and pertinent records as noted above in HPI  Assessment and Plan:  Heart failure with preserved EF Acute on chronic.  Patient presented with complaints of  progressively worsening shortness of breath and swelling after being changed from furosemide 40 mg twice daily to torsemide 100 mg daily.  On physical exam patient has lower extremity swelling and signs of JVD.  BNP 651.  Chest x-ray showing cardiomegaly with vascular congestion.  Last echocardiogram revealed EF of 60 to 65% with indeterminate diastolic parameters.  Patient had been given Lasix 40 mg IV, and has put out at least 600 mL of urine. -Admit to a telemetry bed -Heart failure order set utilized -Strict intake and output -Daily weights -Check echocardiogram -Lasix 40 mg IV twice daily -Continue beta-blocker -Order placed for inpatient cardiology consult.  Will likely need to touch base with patient's nephrologist as well  Chest pain elevated troponin CAD Acute on chronic.  Patient reported having substernal chest pain feels like pressure.  High-sensitivity troponin 42, but review of records note that is chronically elevated.  Thought possibly secondary to demand in the setting of CHF exacerbation.  Patient had recently had stress testing in May which was thought to be low risk with findings consistent with prior inferior MI. -Continue aspirin, statin, isosorbide mononitrate, and beta-blocker  Leukocytosis Acute.  WBC elevated at 11.6.  Patient did not report any complaints of fever, abdominal pain, or dysuria.  Physical exam and chest x-ray did not give any concern for pneumonia. -Check urinalysis  Hypokalemia Acute.  Initial potassium 3.2.  Patient was given 40 mEq of potassium chloride while in the ED.   -Given additional potassium chloride 20 mEq x 1 dose  Frequent PVCs Patient was noted to have multiple  PVCs while in the ED. -Check magnesium level  Essential hypertension Blood pressures elevated up to 183/73 at this time.  Home blood pressure regimen appears to include amlodipine 10 mg nightly, Coreg 6.25 mg twice daily, clonidine 0.1 mg 3 times daily, torsemide 100 mg  daily, isosorbide mononitrate 60 mg twice daily, and hydralazine 100 mg 3 times daily. -Continue Coreg, clonidine, isosorbide, and hydralazine  Chronic kidney disease stage IV On admission creatinine 3.38 with BUN 74.  Patient's kidney function has ranged from 3-3.88 recently.  At this time he is still making urine. -Continue to monitor kidney function with diuresis  Controlled diabetes mellitus type 2 with hyperglycemia On admission glucose 170.  Last hemoglobin A1c 6.1. -Hypoglycemic protocols -CBGs before every meal with very sensitive SSI -NovoLog 0- 5 units nightly -Adjust insulin regimen as needed  Anemia of chronic kidney disease Hemoglobin 8.2 g/dL which appears around patient's baseline.  No reports of bleeding. -Continue to monitor  Wound left leg -Continue routine wound care  Hyperlipidemia -Continue pravastatin  Gout -Continue allopurinol  GERD -Continue Protonix  Obesity BMI 37.77 kg/m   Advance Care Planning:   Code Status: Full Code   Consults: Cardiology  Family Communication: Left voicemail for patient's brother over the phone.  Severity of Illness: The appropriate patient status for this patient is INPATIENT. Inpatient status is judged to be reasonable and necessary in order to provide the required intensity of service to ensure the patient's safety. The patient's presenting symptoms, physical exam findings, and initial radiographic and laboratory data in the context of their chronic comorbidities is felt to place them at high risk for further clinical deterioration. Furthermore, it is not anticipated that the patient will be medically stable for discharge from the hospital within 2 midnights of admission.   * I certify that at the point of admission it is my clinical judgment that the patient will require inpatient hospital care spanning beyond 2 midnights from the point of admission due to high intensity of service, high risk for further deterioration  and high frequency of surveillance required.*  Author: Norval Morton, MD 07/29/2022 12:36 AM  For on call review [email or web address].

## 2022-07-29 NOTE — ED Notes (Signed)
Pt able to use bedside urinal independently.

## 2022-07-29 NOTE — Progress Notes (Signed)
eLink Physician-Brief Progress Note Patient Name: [name] DOB: 1946-10-19 MRN: [id number]   Date of Service  07/29/2022  HPI/Events of Note  Called for hypotension. On vent with 50%/peep 10, his o2 sat is 100. Has been on precedex which even at higher doses did not work and Therapist, sports had to drop the rate down since HR also dropped, as did BP. He got his clonidine, coreg doses and norvasc was held this evening. No distress. Has also go PRN fentanyl and PRN versed orders in place.   eICU Interventions  Will try albumin x 1. Has been on high dose lasix and has made 300 cc urine already since 7 pm  Change peep to 5. Minimal vent settings, maybe this will help BP as well  I also ordered Fentanyl drip in case the Precedex cannot be used due to hemodynamics  Have d/w RN      Intervention Category Major Interventions: Respiratory failure - evaluation and management  Margaretmary Lombard 07/29/2022, 9:31 PM

## 2022-07-29 NOTE — Consult Note (Signed)
Cardiology Consultation:   Patient ID: [name] MRN: [id number]; DOB: Nov 21, 1946  Admit date: 07/28/2022 Date of Consult: 07/29/2022  PCP:  Redmond School, MD   [facility] Providers Cardiologist:  Carlyle Dolly, MD        Patient Profile:   [name] is a 76 y.o. male with a hx of  hx of CAD (s/p DES to LAD and D1 in 10/2012 with nonobstructive disease along RCA and LCx), HFpEF, HTN, HLD and Stage 4 CKD. who is being seen 07/29/2022 for the evaluation of chest pain and acute CHF at the request of Dr. [name].  History of Present Illness:   [name] with a hx of CAD (s/p DES to LAD and D1 in 10/2012 with nonobstructive disease along RCA and LCx), HFpEF, HTN, HLD and Stage 4 CKD.  He walked into the office 03/2022 reporting chest pain and refused to go to the ED for evaluation. Dr. Harl Bowie recommended a Leane Call for further evaluation and this was performed on 05/06/2022 and showed findings consistent with prior inferior infarct and a small apical infarct with mild peri-infarct ischemia but was overall felt to be a low risk study.  EF was preserved at 55%.   He presented to the ED on 05/09/2022 for evaluation of chest pain and dyspnea on exertion consistent with angina. Because of CKD and no intention of going on dialysis, LHC too high risk. Imdur increased to 60 mg daily. Medical management recommended.  Imdur increased 60 mg bid in July.  Patient presented yesterday with recurrent chest pain and acute on chronic diastolic CHF after lasix 40 mg bid changed to torsemide 100 mg daily by renal. This am he became acutely more short of breath and is being moved to ICU for BiPap. He had some mild chest pain with it. EKG shows he is in Afib which may be contributing to this.     Past Medical History:  Diagnosis Date   Anginal pain ([place])    Anxiety    Benign prostatic hypertrophy    Nocturia   CAD (coronary artery disease)    a.  NSTEMI 10/2012 s/p DES to LAD & DES  to 1st diagonal with residual diffuse nonobstructive dz in LCx/RCA    Chronic kidney disease, stage 3, mod decreased GFR (HCC)    Creatinine of 1.5 in 03/2009, STAGE 4 now   Degenerative joint disease    Depression    hx of   Diabetes mellitus, type II ([place]) 87/86/7672   Diastolic heart failure    Pedal edema, "a long time ago"   Erectile dysfunction    Flu 2013   hx of   GERD (gastroesophageal reflux disease)    Gout    Hemorrhoids    High cholesterol    Hypertension    Exercise induced   Myocardial infarction ([place])    Neuropathy    bilateral legs   NSVT (nonsustained ventricular tachycardia) ([place])    Exercise induced   Obesity    Tobacco abuse, 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, quit 1994   Tubular adenoma 2014   Vertigo    everyday    Past Surgical History:  Procedure Laterality Date   BACK SURGERY  2007   x2   BIOPSY  02/05/2021   Procedure: BIOPSY;  Surgeon: Daneil Dolin, MD;  [facility]: [facility];  Service: Endoscopy;;   CARDIAC CATHETERIZATION     with stent placement   COLONOSCOPY WITH ESOPHAGOGASTRODUODENOSCOPY (EGD) N/A  11/23/2013   Dr. Oneida Alar- TCS= left sided colitis and mild proctitis likely due to ischemic colitis, moderate internal hemorrhoids, small nodule in the rectum, bx=tubular adenoma, EGD=-mild non-erosive gastritis   COLONOSCOPY WITH PROPOFOL N/A 07/24/2020   hemorrhoids,  rectosigmoid lipoma, small adenoma removed.   CORONARY ANGIOPLASTY     ESOPHAGOGASTRODUODENOSCOPY (EGD) WITH PROPOFOL N/A 02/05/2021   Normal esophagus, erythematous mucosa in entire stomach, no obvious ulcer, s/p biopsy. Normal duodenum. Negative H.pylori.    HERNIA REPAIR     JOINT REPLACEMENT     LEFT HEART CATHETERIZATION WITH CORONARY ANGIOGRAM N/A 11/16/2012   Procedure: LEFT HEART CATHETERIZATION WITH CORONARY ANGIOGRAM;  Surgeon: Sherren Mocha, MD;  Location: [facility] CATH LAB;  Service: Cardiovascular;  Laterality: N/A;   PERCUTANEOUS CORONARY STENT INTERVENTION (PCI-S)   11/16/2012   Procedure: PERCUTANEOUS CORONARY STENT INTERVENTION (PCI-S);  Surgeon: Sherren Mocha, MD;  Location: [facility] CATH LAB;  Service: Cardiovascular;;   POLYPECTOMY  07/24/2020   Procedure: POLYPECTOMY;  Surgeon: Daneil Dolin, MD;  Location: [facility];  Service: Endoscopy;;   SPINAL CORD STIMULATOR INSERTION N/A 06/10/2022   Procedure: Spinal cord stimulator placement;  Surgeon: Newman Pies, MD;  Location: [place];  Service: Neurosurgery;  Laterality: N/A;  RM 19/ to follow   THUMB AMPUTATION Left    50 years ago   TOTAL KNEE ARTHROPLASTY Right 11/15/2013   Procedure: RIGHT TOTAL KNEE ARTHROPLASTY;  Surgeon: Mauri Pole, MD;  Location: [facility];  Service: Orthopedics;  Laterality: Right;   TOTAL KNEE ARTHROPLASTY Left 03/07/2015   Procedure: LEFT TOTAL KNEE ARTHROPLASTY;  Surgeon: Paralee Cancel, MD;  Location: [facility];  Service: Orthopedics;  Laterality: Left;     Home Medications:  Prior to Admission medications   Medication Sig Start Date End Date Taking? Authorizing Provider  isosorbide mononitrate (IMDUR) 60 MG 24 hr tablet Take 1 tablet (60 mg total) by mouth 2 (two) times daily. 06/26/22 06/21/23 Yes BranchAlphonse Guild, MD  acarbose (PRECOSE) 50 MG tablet Take 50 mg by mouth in the morning, at noon, and at bedtime.  10/20/17   [provider]  allopurinol (ZYLOPRIM) 100 MG tablet Take 100 mg by mouth daily. 08/17/20   [provider]  amLODipine (NORVASC) 10 MG tablet Take 10 mg by mouth every evening.     [provider]  aspirin EC 81 MG tablet Take 1 tablet (81 mg total) by mouth daily. 09/14/19   Herminio Commons, MD  calcitRIOL (ROCALTROL) 0.25 MCG capsule Take 0.25 mcg by mouth every Sunday.  12/13/16   [provider]  carboxymethylcellulose (REFRESH PLUS) 0.5 % SOLN Place 1 drop into both eyes daily as needed (dry eyes).    [provider]  carvedilol (COREG) 6.25 MG tablet Take 6.25 mg by mouth 2 (two) times daily with a  meal. 04/18/22 04/18/23  [provider]  cholecalciferol (VITAMIN D) 1000 units tablet Take 1,000 Units daily by mouth.    [provider]  cloNIDine (CATAPRES) 0.1 MG tablet Take 0.1 mg by mouth 3 (three) times daily. 02/15/20   [provider]  colchicine 0.6 MG tablet     [provider]  dicyclomine (BENTYL) 10 MG capsule     [provider]  diphenoxylate-atropine (LOMOTIL) 2.5-0.025 MG tablet Take 2 tablets by mouth 4 (four) times daily as needed. 06/18/22   [provider]  epoetin alfa (EPOGEN) 3000 UNIT/ML injection Inject into the skin. 07/03/22   [provider]  erythromycin ophthalmic ointment APPLY  1 CM RIBBON INTO THE LOWER CONJUNCTIVAL SAC(S) IN THE AFFECTED EYE(S) BY OPHTHALMIC ROUTE 3 TIMES PER DAY FOR ONE WEEK    [provider]  furosemide (LASIX) 40 MG tablet Take 1 tablet (40 mg total) by mouth daily. Patient taking differently: Take 40 mg by mouth 2 (two) times daily. 05/11/22   Pokhrel, Corrie Mckusick, MD  gabapentin (NEURONTIN) 300 MG capsule Take 300 mg by mouth 3 (three) times daily.  10/19/19   [provider]  hydrALAZINE (APRESOLINE) 100 MG tablet Take 200 mg by mouth 2 (two) times daily. 01/24/22   [provider]  HYDROcodone-acetaminophen (NORCO) 10-325 MG tablet Take 1-2 tablets by mouth 4 (four) times daily as needed for pain. 05/27/22   [provider]  insulin NPH Human (NOVOLIN N) 100 UNIT/ML injection Inject 0.18 mLs (18 Units total) into the skin every morning. Ands syringes 1/day Patient taking differently: Inject 20 Units into the skin daily as needed (if CBG is high). Per sliding scale: not provided 06/27/21   Renato Shin, MD  lactulose (CONSTULOSE) 10 GM/15ML solution     [provider]  LORazepam (ATIVAN) 0.5 MG tablet     [provider]  losartan (COZAAR) 25 MG tablet     [provider]  lubiprostone (AMITIZA) 8 MCG capsule Take 8 mcg by mouth  daily as needed for constipation.    [provider]  meclizine (ANTIVERT) 25 MG tablet Take 25 mg by mouth 3 (three) times daily as needed for dizziness.    [provider]  mupirocin ointment (BACTROBAN) 2 % SMARTSIG:sparingly Topical 3 Times Daily 07/10/22   [provider]  nitroGLYCERIN (NITROSTAT) 0.4 MG SL tablet Place 1 tablet (0.4 mg total) under the tongue every 5 (five) minutes as needed for chest pain. PLACE ONE TABLET UNDER THE TONGUE EVERY 5 MINUTES FOR 3 DOSES AS NEEDED Strength: 0.4 mg 02/07/22   Arnoldo Lenis, MD  ondansetron (ZOFRAN) 4 MG tablet TAKE ONE TABLET (4MG TOTAL) BY MOUTH EVERY 8 HOURS AS NEEDED FOR NAUSEA OR VOMITING 04/24/21   Annitta Needs, NP  pantoprazole (PROTONIX) 40 MG tablet TAKE ONE TABLET (40MG TOTAL) BY MOUTH TWO TIMES DAILY BEFORE A MEAL Patient taking differently: Take 40 mg by mouth 2 (two) times daily. 05/11/21   Erenest Rasher, PA-C  pravastatin (PRAVACHOL) 40 MG tablet Take 1 tablet (40 mg total) by mouth every evening. 07/31/21 02/09/28  Imogene Burn, PA-C  RESTASIS 0.05 % ophthalmic emulsion 1 drop 2 (two) times daily. 06/19/22   [provider]  silver sulfADIAZINE (SSD) 1 % cream     [provider]  sitaGLIPtin (JANUVIA) 25 MG tablet     [provider]  tiZANidine (ZANAFLEX) 4 MG tablet     [provider]  TRUE METRIX BLOOD GLUCOSE TEST test strip USE TO TEST BLOOD SUGAR UP TO TWICE DAILY OR AS DIRECTED 08/31/21   Renato Shin, MD    Inpatient Medications: Scheduled Meds:  allopurinol  100 mg Oral Daily   amLODipine  10 mg Oral QPM   aspirin EC  81 mg Oral Daily   carvedilol  6.25 mg Oral BID WC   Chlorhexidine Gluconate Cloth  6 each Topical Q0600   cloNIDine  0.1 mg Oral TID   furosemide  40 mg Intravenous BID   heparin  5,000 Units Subcutaneous Q8H   hydrALAZINE  100 mg Oral TID   insulin aspart  0-5 Units Subcutaneous QHS   insulin  aspart  0-6 Units Subcutaneous TID WC    isosorbide mononitrate  60 mg Oral BID   nitroGLYCERIN       pantoprazole  40 mg Oral BID   pravastatin  40 mg Oral QPM   sodium chloride flush  3 mL Intravenous Q12H   Continuous Infusions:  sodium chloride     PRN Meds: sodium chloride, acetaminophen, nitroGLYCERIN, ondansetron (ZOFRAN) IV, mouth rinse, sodium chloride flush  Allergies:    Allergies  Allergen Reactions   Metformin Nausea And Vomiting   Niacin    Niacin And Related Other (See Comments)    Reaction: flushing and burning side effects even with extended release    Social History:   Social History   Socioeconomic History   Marital status: Widowed    Spouse name: Not on file   Number of children: Not on file   Years of education: Not on file   Highest education level: Not on file  Occupational History   Occupation: Disabled    Employer: RETIRED  Tobacco Use   Smoking status: Former    Packs/day: 1.00    Years: 30.00    Total pack years: 30.00    Types: Cigarettes    Start date: 01/22/1965    Quit date: 12/23/1992    Years since quitting: 29.6   Smokeless tobacco: Never  Vaping Use   Vaping Use: Never used  Substance and Sexual Activity   Alcohol use: No    Alcohol/week: 0.0 standard drinks of alcohol   Drug use: No   Sexual activity: Not Currently  Other Topics Concern   Not on file  Social History Narrative   Married   Right handed   One story home no caffeine   Social Determinants of Health   Financial Resource Strain: Not on file  Food Insecurity: Not on file  Transportation Needs: Not on file  Physical Activity: Not on file  Stress: Not on file  Social Connections: Not on file  Intimate Partner Violence: Not on file    Family History:     Family History  Problem Relation Age of Onset   Hypertension Other    Cancer Other    Heart disease Other    Diabetes Other    Hypertension Mother    Cancer Father        brain   CAD Brother    Colon cancer Neg Hx      ROS:  Please  see the history of present illness.  Review of Systems  Reason unable to perform ROS: acute decompensation.  Constitutional: Negative.  HENT: Negative.    Cardiovascular: Negative.   Respiratory: Negative.    Endocrine: Negative.   Hematologic/Lymphatic: Negative.   Musculoskeletal: Negative.   Gastrointestinal: Negative.   Genitourinary: Negative.   Neurological: Negative.     All other ROS reviewed and negative.     Physical Exam/Data:   Vitals:   07/29/22 0039 07/29/22 0100 07/29/22 0511 07/29/22 0852  BP: (!) 147/103 (!) 183/73 (!) 154/70 (!) 194/97  Pulse: 75 87 93 (!) 102  Resp: 20 20 18  (!) 29  Temp: 97.8 F (36.6 C) 97.9 F (36.6 C) 98.5 F (36.9 C) 99.5 F (37.5 C)  TempSrc: Oral Oral  Axillary  SpO2: 91% 92% 91% 96%  Weight:    122.8 kg  Height:    6' 2"  (1.88 m)    Intake/Output Summary (Last 24 hours) at 07/29/2022 3016 Last data filed at 07/29/2022 0500 Gross per 24 hour  Intake 3 ml  Output 1450 ml  Net -1447 ml      07/29/2022    8:52 AM 07/28/2022   11:26 PM 07/28/2022   11:25 PM  Last 3 Weights  Weight (lbs) 270 lb 11.6 oz 278 lb 9.6 oz 265 lb  Weight (kg) 122.8 kg 126.372 kg 120.203 kg     Body mass index is 34.76 kg/m.  General:  Obese, in respiratory distress HEENT: normal Neck: no JVD Vascular: No carotid bruits; Distal pulses 2+ bilaterally Cardiac:  normal S1, S2; irreg distant HS Lungs:  decreased breath sounds with  rales  Abd: soft, nontender, no hepatomegaly  Ext: 3-4 plus edema Musculoskeletal:  No deformities, BUE and BLE strength normal and equal Skin: warm and dry  Neuro:  CNs 2-12 intact, no focal abnormalities noted Psych:  resp distress  EKG:  The EKG was personally reviewed and demonstrates:  Afib with poor R wave progression, old septal infarct, PVC's Telemetry:  Telemetry was personally reviewed and demonstrates:  in/out Afib  Relevant CV Studies:    Echocardiogram: 01/2022 IMPRESSIONS     1. Left ventricular  ejection fraction, by estimation, is 60 to 65%. The  left ventricle has normal function. The left ventricle has no regional  wall motion abnormalities. There is moderate left ventricular hypertrophy.  Left ventricular diastolic  parameters are indeterminate.   2. Right ventricular systolic function is normal. The right ventricular  size is normal.   3. Left atrial size was moderately dilated.   4. The mitral valve is normal in structure. No evidence of mitral valve  regurgitation. No evidence of mitral stenosis.   5. The aortic valve was not well visualized. There is moderate  calcification of the aortic valve. There is moderate thickening of the  aortic valve. Aortic valve regurgitation is not visualized. Aortic valve  sclerosis is present, with no evidence of  aortic valve stenosis.   6. The inferior vena cava is normal in size with greater than 50%  respiratory variability, suggesting right atrial pressure of 3 mmHg.   NST: 04/2022   Findings are consistent with prior inferior myocardial infarction. There is a small apical infarct with mild peri-infarct ischemia.  The study is low risk.   No ST deviation was noted.   LV perfusion is abnormal.   Left ventricular function is normal. Nuclear stress EF: 55 %. The left ventricular ejection fraction is normal (55-65%). End diastolic cavity size is severely enlarged.     Laboratory Data:  High Sensitivity Troponin:   Recent Labs  Lab 07/28/22 2328 07/29/22 0213  TROPONINIHS 42* 41*     Chemistry Recent Labs  Lab 07/28/22 2328 07/29/22 0213  NA 137  --   K 3.2*  --   CL 103  --   CO2 23  --   GLUCOSE 170*  --   BUN 74*  --   CREATININE 3.38*  --   CALCIUM 8.9  --   MG  --  1.9  GFRNONAA 18*  --   ANIONGAP 11  --     Recent Labs  Lab 07/28/22 2328  PROT 7.6  ALBUMIN 4.2  AST 41  ALT 39  ALKPHOS 59  BILITOT 0.4   Lipids No results for input(s): "CHOL", "TRIG", "HDL", "LABVLDL", "LDLCALC", "CHOLHDL" in the last  168 hours.  Hematology Recent Labs  Lab 07/28/22 2328  WBC 11.6*  RBC 2.57*  HGB 8.2*  HCT 25.0*  MCV 97.3  MCH 31.9  MCHC  32.8  RDW 16.4*  PLT 227   Thyroid No results for input(s): "TSH", "FREET4" in the last 168 hours.  BNP Recent Labs  Lab 07/28/22 2338  BNP 651.0*    DDimer No results for input(s): "DDIMER" in the last 168 hours.   Radiology/Studies:  DG Chest Portable 1 View  Result Date: 07/28/2022 CLINICAL DATA:  Shortness of breath, chest pain EXAM: PORTABLE CHEST 1 VIEW COMPARISON:  05/29/2022 FINDINGS: Cardiomegaly, vascular congestion. No confluent opacities, effusions or edema. No acute bony abnormality. IMPRESSION: Cardiomegaly, vascular congestion. Electronically Signed   By: Rolm Baptise M.D.   On: 07/28/2022 23:46     Assessment and Plan:   HFpEF - Echocardiogram in 01/2022 showed a preserved EF of 60 to 65% with no regional wall motion abnormalities. Diuretic therapy has been deferred to Nephrology in the outpatient setting given his advanced CKD. Has diuresed 1.4L after IV lasix 40 mg but acute decompensation this am. Significant edema on exam. Now on Bipap and moving to ICU. Renal to assist with diuretics. He's declined future dialysis in the past. K 3.2 yest, today's pending. Mg 1.9.  Afib rate low 100's new for him which could be contributing to acute decompensation. CHADsVasc=6 so ultimately should be on eliquis. With anemia assumed chronic disease may need to make sure no bleed on ASA and Plavix. Discuss with Dr. Debara Pickett.  CAD - s/p DES to LAD and D1 in 10/2012 with nonobstructive disease along RCA and LCx. Continue ASA, BB and statin therapy. NST 05/06/22 low risk, mild peri infarct ischemia. Recurrent chest pain and admission- Avoid cath with CKD and no intention of going on dialysis. Recurrent chest pain-troponins flat   HTN - His BP is elevated Remains on Coreg, Amlodipine and Hydralazine, clonidine, Imdur.    HLD - on Pravastatin LDL 46 05/10/22    Stage 4 CKD  - Baseline creatinine now 3.3-3.8 managed by renal     Anemia - Hgb at 8.2  likely due to chronic disease but this has significantly declined over the past few months as previously at 11 - 12 in 2022. No reports of active bleeding. Management per the admitting team.      Risk Assessment/Risk Scores:     HEAR Score (for undifferentiated chest pain):     New York Heart Association (NYHA) Functional Class NYHA Class IV  CHA2DS2-VASc Score = 6   This indicates a 9.7% annual risk of stroke. The patient's score is based upon: CHF History: 1 HTN History: 1 Diabetes History: 1 Stroke History: 0 Vascular Disease History: 1 Age Score: 2 Gender Score: 0         For questions or updates, please contact [facility] Please consult [email or web address] for contact info under    Signed, Ermalinda Barrios, PA-C  07/29/2022 9:03 AM

## 2022-07-29 NOTE — ED Notes (Signed)
Hospitalist at bedside 

## 2022-07-29 NOTE — Consult Note (Signed)
[facility]   Patient name: [name] Admit date: 07/28/2022  DOB: 08/28/1946 LOS: 0  MRN: [id number] Consult date: 07/29/2022  Referring provider: Dr. [name], [facility]: Short of breath    History:  76 yo male former smoker presented to [facility] ER with chest pain and dyspnea for 2 days.  He was admitted for exacerbation of heart failure with renal failure.  BP was as high as 194/97.  He was started on Bipap.  He was seen by cardiology and nephrology.  PCCM consulted to assist with respiratory management.  Past medical history:  Anxiety, BPH, CAD s/p DES, CKD 4, DJD, Depression, DM type 2, Diastolic CHF, ED, GERD, Gout, HLD, HTN, Neuropathy, Vertigo  Significant events:  8/06 Admit 8/07 cardiology/nephrology consulted, intubated  Studies:  Echo 8/07 >>   Micro:    Lines:  ETT 8/07 >>   Antibiotics:    Consults:  Cardiology    Interim history:  He was c/o chest pain and dyspnea.  Didn't feel like he was getting enough air despite being on Bipap.  Feels congested in his chest.    Vital signs:  BP (!) 149/68   Pulse 91   Temp 98.9 F (37.2 C) (Axillary)   Resp (!) 35   Ht 6' 2"  (1.88 m)   Wt 122.8 kg   SpO2 92%   BMI 34.76 kg/m   Intake/output:  I/O last 3 completed shifts: In: 3 [I.V.:3] Out: 1450 [Urine:1450]   Physical exam:   General - somnolent Eyes - pupils reactive ENT - Bipap mask on Cardiac - irregular Chest - paradoxical breathing, using accessory muscles, poor air movement with decreased breath sounds Abdomen - soft, non tender, decreased bowel sounds Extremities - 2+ edema Skin - chronic wound on Lt leg Neuro - wakes up with stimulation, able to state his name and location, follows commands, moves extremities  Best practice:   DVT - SQ heparin SUP - protonix Nutrition - carb mod/heart healthy   Assessment/plan:   Acute hypoxic respiratory failure from acute pulmonary edema. -  not improved with Bipap - proceed with intubation - f/u CXR, ABG - goal SpO2 > 92%  Hypertensive urgency. Acute on chronic diastolic CHF. Atrial fibrillation. Hx of CAD s/p DES, HLD. - cardiology consulted - f/u Echo - goal SBP normotensive; suspect his blood pressure medications will need to be titrated down after he is intubated  Stage 4 CKD. Hypokalemia. - he indicated to me on 8/07 that he would be okay with dialysis if needed - nephrology consulted - continue lasix  DM type 2. Anemia of chronic disease. GERD. Gout. Chronic Lt leg wound. - per primary team  Resolved hospital problems:    Goals of care/Family discussions:  Code status: full code  Labs:      Latest Ref Rng & Units 07/28/2022   11:28 PM 06/04/2022    2:11 PM 05/29/2022    7:36 PM  CMP  Glucose 70 - 99 mg/dL 170  149  138   BUN 8 - 23 mg/dL 74  69  81   Creatinine 0.61 - 1.24 mg/dL 3.38  3.00  3.88   Sodium 135 - 145 mmol/L 137  139  137   Potassium 3.5 - 5.1 mmol/L 3.2  3.5  4.0   Chloride 98 - 111 mmol/L 103  103  99   CO2 22 - 32 mmol/L 23  25  24    Calcium 8.9 - 10.3 mg/dL 8.9  9.0  9.4   Total Protein 6.5 - 8.1 g/dL 7.6     Total Bilirubin 0.3 - 1.2 mg/dL 0.4     Alkaline Phos 38 - 126 U/L 59     AST 15 - 41 U/L 41     ALT 0 - 44 U/L 39          Latest Ref Rng & Units 07/28/2022   11:28 PM 07/17/2022    2:04 PM 06/04/2022    2:11 PM  CBC  WBC 4.0 - 10.5 K/uL 11.6  6.7  5.7   Hemoglobin 13.0 - 17.0 g/dL 8.2  8.4  9.5   Hematocrit 39.0 - 52.0 % 25.0  25.9  29.1   Platelets 150 - 400 K/uL 227  206  203     ABG No results found for: "PHART", "PCO2ART", "PO2ART", "HCO3", "TCO2", "ACIDBASEDEF", "O2SAT"  CBG (last 3)  Recent Labs    07/29/22 0710 07/29/22 1127  [place] 178* 182*     Past surgical history:  He  has a past surgical history that includes Cardiac catheterization; Back surgery (2007); Hernia repair; Total knee arthroplasty (Right, 11/15/2013); Colonoscopy with  esophagogastroduodenoscopy (egd) (N/A, 11/23/2013); left heart catheterization with coronary angiogram (N/A, 11/16/2012); percutaneous coronary stent intervention (pci-s) (11/16/2012); Total knee arthroplasty (Left, 03/07/2015); Colonoscopy with propofol (N/A, 07/24/2020); polypectomy (07/24/2020); Esophagogastroduodenoscopy (egd) with propofol (N/A, 02/05/2021); biopsy (02/05/2021); Joint replacement; Thumb amputation (Left); Coronary angioplasty; and Spinal cord stimulator insertion (N/A, 06/10/2022).  Social history:  He  reports that he quit smoking about 29 years ago. His smoking use included cigarettes. He started smoking about 57 years ago. He has a 30.00 pack-year smoking history. He has never used smokeless tobacco. He reports that he does not drink alcohol and does not use drugs.   Review of systems:  Reviewed and negative  Family history:  His family history includes CAD in his brother; Cancer in his father and another family member; Diabetes in an other family member; Heart disease in an other family member; Hypertension in his mother and another family member.    Medications:   No current facility-administered medications on file prior to encounter.   Current Outpatient Medications on File Prior to Encounter  Medication Sig   isosorbide mononitrate (IMDUR) 60 MG 24 hr tablet Take 1 tablet (60 mg total) by mouth 2 (two) times daily.   acarbose (PRECOSE) 50 MG tablet Take 50 mg by mouth in the morning, at noon, and at bedtime.    allopurinol (ZYLOPRIM) 100 MG tablet Take 100 mg by mouth daily.   amLODipine (NORVASC) 10 MG tablet Take 10 mg by mouth every evening.    aspirin EC 81 MG tablet Take 1 tablet (81 mg total) by mouth daily.   calcitRIOL (ROCALTROL) 0.25 MCG capsule Take 0.25 mcg by mouth every Sunday.    carboxymethylcellulose (REFRESH PLUS) 0.5 % SOLN Place 1 drop into both eyes daily as needed (dry eyes).   carvedilol (COREG) 6.25 MG tablet Take 6.25 mg by mouth 2 (two)  times daily with a meal.   cholecalciferol (VITAMIN D) 1000 units tablet Take 1,000 Units daily by mouth.   cloNIDine (CATAPRES) 0.1 MG tablet Take 0.1 mg by mouth 3 (three) times daily.   colchicine 0.6 MG tablet    dicyclomine (BENTYL) 10 MG capsule    diphenoxylate-atropine (LOMOTIL) 2.5-0.025 MG tablet Take 2 tablets by mouth 4 (four) times daily as needed.   epoetin alfa (EPOGEN) 3000 UNIT/ML injection Inject into the skin.  erythromycin ophthalmic ointment APPLY 1 CM RIBBON INTO THE LOWER CONJUNCTIVAL SAC(S) IN THE AFFECTED EYE(S) BY OPHTHALMIC ROUTE 3 TIMES PER DAY FOR ONE WEEK   furosemide (LASIX) 40 MG tablet Take 1 tablet (40 mg total) by mouth daily. (Patient taking differently: Take 40 mg by mouth 2 (two) times daily.)   gabapentin (NEURONTIN) 300 MG capsule Take 300 mg by mouth 3 (three) times daily.    hydrALAZINE (APRESOLINE) 100 MG tablet Take 200 mg by mouth 2 (two) times daily.   HYDROcodone-acetaminophen (NORCO) 10-325 MG tablet Take 1-2 tablets by mouth 4 (four) times daily as needed for pain.   insulin NPH Human (NOVOLIN N) 100 UNIT/ML injection Inject 0.18 mLs (18 Units total) into the skin every morning. Ands syringes 1/day (Patient taking differently: Inject 20 Units into the skin daily as needed (if CBG is high). Per sliding scale: not provided)   lactulose (CONSTULOSE) 10 GM/15ML solution    LORazepam (ATIVAN) 0.5 MG tablet    losartan (COZAAR) 25 MG tablet    lubiprostone (AMITIZA) 8 MCG capsule Take 8 mcg by mouth daily as needed for constipation.   meclizine (ANTIVERT) 25 MG tablet Take 25 mg by mouth 3 (three) times daily as needed for dizziness.   mupirocin ointment (BACTROBAN) 2 % SMARTSIG:sparingly Topical 3 Times Daily   nitroGLYCERIN (NITROSTAT) 0.4 MG SL tablet Place 1 tablet (0.4 mg total) under the tongue every 5 (five) minutes as needed for chest pain. PLACE ONE TABLET UNDER THE TONGUE EVERY 5 MINUTES FOR 3 DOSES AS NEEDED Strength: 0.4 mg   ondansetron  (ZOFRAN) 4 MG tablet TAKE ONE TABLET (4MG TOTAL) BY MOUTH EVERY 8 HOURS AS NEEDED FOR NAUSEA OR VOMITING   pantoprazole (PROTONIX) 40 MG tablet TAKE ONE TABLET (40MG TOTAL) BY MOUTH TWO TIMES DAILY BEFORE A MEAL (Patient taking differently: Take 40 mg by mouth 2 (two) times daily.)   pravastatin (PRAVACHOL) 40 MG tablet Take 1 tablet (40 mg total) by mouth every evening.   RESTASIS 0.05 % ophthalmic emulsion 1 drop 2 (two) times daily.   silver sulfADIAZINE (SSD) 1 % cream    sitaGLIPtin (JANUVIA) 25 MG tablet    tiZANidine (ZANAFLEX) 4 MG tablet    TRUE METRIX BLOOD GLUCOSE TEST test strip USE TO TEST BLOOD SUGAR UP TO TWICE DAILY OR AS DIRECTED     Critical care time: 42 minutes independent of procedure time  Chesley Mires, MD Reader Pager - [phone] 07/29/2022, 3:15 PM

## 2022-07-29 NOTE — Hospital Course (Addendum)
76 y.o. male with medical history significant of hypertension, hyperlipidemia, diastolic heart failure, CAD s/p stent, CKD stage IV, and obesity who presents with complaints of shortness of breath and chest pain starting 2 days ago.  Chest pain is substernal and feels like pressure. He chronically has orthopnea and has a bed that he always sleeps sitting up.  He has been feeling short of breath even when he is not doing anything which is not normal for him.  Denies having any fever, nausea, vomiting, diarrhea, abdominal pain, or diaphoresis.   Patient reports that he is followed by Dr. Harl Bowie of cardiology and at the end of June had been recommended to increase his Imdur to 60 mg twice daily.  He states that this initially helped decrease symptoms of chest pain.  Then he was told to start taking furosemide 40 mg twice daily by his nephrologist Dr. Theador Hawthorne on 6/12 and was started on torsemide 100 mg daily.  Since doing that patient reports that he has been swelling more.  He developed a blister on his left leg due to the swelling and he has been putting ointment on it.  Denies having any redness or erythema from the wound.   Upon admission into the emergency patient was seen to be afebrile with blood pressures 140/82 to 158/73 and all other vital signs maintained.  Labs significant for WBC 11.6, hemoglobin 8.2, potassium 3.2, BUN 74, creatinine 3.38, glucose 170, BNP 651, and high-sensitivity troponin 42. Chest x-ray showed cardiomegaly with vascular congestion.  Patient has been given Lasix 40 mg IV, potassium chloride 40 mEq, and magnesium oxide 800 mg p.o.  07/29/2022: Pt decompensated on med surg unit and rapid response called and patient placed on bipap and transferred to stepdown ICU. Cardiology and nephrology consulted and patient started on maximal dose IV lasix by nephrology.  Pt noted to be in atrial fibrillation.  Family was called by MD and updated with change in status and treatment plan.     07/30/2022: Pt was intubated on 8/7 after failed bipap trial.  Afib converted to NSR.  He has diuresed >3L on high dose lasix therapy.    08/02/22--pt requested opioids.  After verifying with PCP pt has been receiving chronic norco, it was restarted.  Continues to have nausea--suspect partly uremia from renal failure.   Start zofran OTC.  Reglan IV x 1

## 2022-07-29 NOTE — Consult Note (Signed)
[name] Renal Consultation Note  Requesting MD: [name] Indication for Consultation: CKD and volume overload  HPI:  [name] is a 76 y.o. male with HTN, DM, gout, diastolic heart failure, CAD and stage 4 CKD followed by Dr. Theador Hawthorne as OP.  He presented to the ER on 8/6 with complaints of SOB and CP-  troponin was 42-  CXR showed vascular congestion-  he was given 40 of IV lasix ( was on as OP torsemide 100 daily)  He was noted to become acutely worse this AM-  currently in ICU on bipap-  limited conversation-  says feels better with bipap but still having CP.  Cardiology has seen-  made the notation that pt is not interested in dialysis -  I do not see this in Dr. Lesia Hausen notes or that it has been discussed-  baseline crt in the 3's-  3.38 yesterday evening indicating a GFR of 18.  I am consulted for diuretic therapy   Creat  Date/Time Value Ref Range Status  09/18/2015 01:59 PM 2.49 (H) 0.70 - 1.18 mg/dL Final  08/25/2015 04:39 PM 2.24 (H) 0.70 - 1.18 mg/dL Final  01/07/2013 03:45 PM 1.48 (H) 0.50 - 1.35 mg/dL Final  11/24/2012 02:42 PM 2.69 (H) 0.50 - 1.35 mg/dL Final   Creatinine, Ser  Date/Time Value Ref Range Status  07/28/2022 11:28 PM 3.38 (H) 0.61 - 1.24 mg/dL Final  06/04/2022 02:11 PM 3.00 (H) 0.61 - 1.24 mg/dL Final  05/29/2022 07:36 PM 3.88 (H) 0.61 - 1.24 mg/dL Final  05/11/2022 01:26 AM 3.26 (H) 0.61 - 1.24 mg/dL Final  05/10/2022 03:40 AM 3.76 (H) 0.61 - 1.24 mg/dL Final  05/09/2022 07:58 PM 4.12 (H) 0.61 - 1.24 mg/dL Final  02/07/2022 03:16 AM 2.58 (H) 0.61 - 1.24 mg/dL Final  02/06/2022 05:23 PM 2.81 (H) 0.61 - 1.24 mg/dL Final  06/29/2021 02:37 AM 3.18 (H) 0.61 - 1.24 mg/dL Final  05/22/2021 12:48 PM 2.81 (H) 0.61 - 1.24 mg/dL Final  02/01/2021 01:07 PM 2.46 (H) 0.61 - 1.24 mg/dL Final  01/02/2021 03:48 PM 1.87 (H) 0.76 - 1.27 mg/dL Final  12/14/2020 12:43 PM 1.74 (H) 0.76 - 1.27 mg/dL Final  07/20/2020 02:09 PM 2.06 (H) 0.61 - 1.24 mg/dL  Final  10/28/2019 06:22 AM 2.02 (H) 0.61 - 1.24 mg/dL Final  10/27/2019 04:24 AM 2.24 (H) 0.61 - 1.24 mg/dL Final  10/26/2019 05:37 AM 2.29 (H) 0.61 - 1.24 mg/dL Final  10/25/2019 10:42 AM 2.08 (H) 0.61 - 1.24 mg/dL Final  04/06/2019 04:40 AM 2.18 (H) 0.61 - 1.24 mg/dL Final  04/04/2019 06:17 AM 2.06 (H) 0.61 - 1.24 mg/dL Final  04/03/2019 06:50 AM 2.11 (H) 0.61 - 1.24 mg/dL Final  04/02/2019 05:57 AM 2.17 (H) 0.61 - 1.24 mg/dL Final  04/01/2019 04:11 PM 2.46 (H) 0.61 - 1.24 mg/dL Final  02/19/2019 02:27 PM 2.32 (H) 0.61 - 1.24 mg/dL Final  12/12/2018 09:24 PM 2.19 (H) 0.61 - 1.24 mg/dL Final  12/14/2016 09:41 AM 2.06 (H) 0.61 - 1.24 mg/dL Final  10/02/2016 02:39 PM 2.61 (H) 0.61 - 1.24 mg/dL Final  07/24/2015 06:00 PM 2.25 (H) 0.61 - 1.24 mg/dL Final  07/17/2015 04:20 PM 3.58 (H) 0.61 - 1.24 mg/dL Final  07/10/2015 06:47 AM 2.50 (H) 0.61 - 1.24 mg/dL Final  07/09/2015 06:20 AM 2.19 (H) 0.61 - 1.24 mg/dL Final  07/08/2015 06:40 AM 2.19 (H) 0.61 - 1.24 mg/dL Final  07/07/2015 06:51 AM 2.03 (H) 0.61 - 1.24 mg/dL Final  07/06/2015 06:30 AM 2.04 (  H) 0.61 - 1.24 mg/dL Final  07/05/2015 07:01 AM 2.01 (H) 0.61 - 1.24 mg/dL Final  07/04/2015 07:32 PM 2.22 (H) 0.61 - 1.24 mg/dL Final  03/22/2015 07:00 AM 2.13 (H) 0.50 - 1.35 mg/dL Final  03/15/2015 07:25 AM 2.07 (H) 0.50 - 1.35 mg/dL Final  03/12/2015 06:45 AM 1.82 (H) 0.50 - 1.35 mg/dL Final  03/10/2015 07:30 AM 1.53 (H) 0.50 - 1.35 mg/dL Final  03/09/2015 05:05 AM 1.75 (H) 0.50 - 1.35 mg/dL Final  03/08/2015 05:22 AM 1.90 (H) 0.50 - 1.35 mg/dL Final  03/02/2015 11:20 AM 1.80 (H) 0.50 - 1.35 mg/dL Final  11/22/2013 05:27 AM 1.46 (H) 0.50 - 1.35 mg/dL Final  11/20/2013 06:01 AM 1.49 (H) 0.50 - 1.35 mg/dL Final  11/19/2013 05:31 AM 1.50 (H) 0.50 - 1.35 mg/dL Final  11/17/2013 04:30 AM 1.33 0.50 - 1.35 mg/dL Final  11/16/2013 04:25 AM 1.31 0.50 - 1.35 mg/dL Final  11/08/2013 02:40 PM 1.65 (H) 0.50 - 1.35 mg/dL Final  06/07/2013 08:22 PM 2.21  (H) 0.50 - 1.35 mg/dL Final  12/13/2012 06:03 AM 1.69 (H) 0.50 - 1.35 mg/dL Final  12/12/2012 06:06 AM 2.00 (H) 0.50 - 1.35 mg/dL Final     PMHx:   Past Medical History:  Diagnosis Date   Anginal pain ([place])    Anxiety    Benign prostatic hypertrophy    Nocturia   CAD (coronary artery disease)    a.  NSTEMI 10/2012 s/p DES to LAD & DES to 1st diagonal with residual diffuse nonobstructive dz in LCx/RCA    Chronic kidney disease, stage 3, mod decreased GFR (H[facility])    Creatinine of 1.5 in 03/2009, STAGE 4 now   Degenerative joint disease    Depression    hx of   Diabetes mellitus, type II ([place]) 10/93/2355   Diastolic heart failure    Pedal edema, "a long time ago"   Erectile dysfunction    Flu 2013   hx of   GERD (gastroesophageal reflux disease)    Gout    Hemorrhoids    High cholesterol    Hypertension    Exercise induced   Myocardial infarction ([place])    Neuropathy    bilateral legs   NSVT (nonsustained ventricular tachycardia) ([place])    Exercise induced   Obesity    Tobacco abuse, 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 Oneida Alar- TCS= left sided colitis and mild proctitis likely due to ischemic colitis, moderate internal hemorrhoids, small nodule in the rectum, bx=tubular adenoma, EGD=-mild non-erosive gastritis   COLONOSCOPY WITH PROPOFOL N/A 07/24/2020   hemorrhoids,  rectosigmoid lipoma, small adenoma removed.   CORONARY ANGIOPLASTY     ESOPHAGOGASTRODUODENOSCOPY (EGD) WITH PROPOFOL N/A 02/05/2021   Normal esophagus, erythematous mucosa in entire stomach, no obvious ulcer, s/p biopsy. Normal  duodenum. Negative H.pylori.    HERNIA REPAIR     JOINT REPLACEMENT     LEFT HEART CATHETERIZATION WITH CORONARY ANGIOGRAM N/A 11/16/2012   Procedure: LEFT HEART CATHETERIZATION WITH CORONARY ANGIOGRAM;  Surgeon: Sherren Mocha, MD;  Location: [facility] CATH  LAB;  Service: Cardiovascular;  Laterality: N/A;   PERCUTANEOUS CORONARY STENT INTERVENTION (PCI-S)  11/16/2012   Procedure: PERCUTANEOUS CORONARY STENT INTERVENTION (PCI-S);  Surgeon: Sherren Mocha, MD;  Location: [facility] CATH LAB;  Service: Cardiovascular;;   POLYPECTOMY  07/24/2020   Procedure: POLYPECTOMY;  Surgeon: Daneil Dolin, MD;  Location: [facility];  Service: Endoscopy;;   SPINAL CORD STIMULATOR INSERTION N/A 06/10/2022   Procedure: Spinal cord stimulator placement;  Surgeon: Newman Pies, MD;  Location: [place];  Service: Neurosurgery;  Laterality: N/A;  RM 19/ to follow   THUMB AMPUTATION Left    50 years ago   TOTAL KNEE ARTHROPLASTY Right 11/15/2013   Procedure: RIGHT TOTAL KNEE ARTHROPLASTY;  Surgeon: Mauri Pole, MD;  Location: [facility];  Service: Orthopedics;  Laterality: Right;   TOTAL KNEE ARTHROPLASTY Left 03/07/2015   Procedure: LEFT TOTAL KNEE ARTHROPLASTY;  Surgeon: Paralee Cancel, MD;  Location: [facility];  Service: Orthopedics;  Laterality: Left;    Family Hx:  Family History  Problem Relation Age of Onset   Hypertension Other    Cancer Other    Heart disease Other    Diabetes Other    Hypertension Mother    Cancer Father        brain   CAD Brother    Colon cancer Neg Hx     Social History:  reports that he quit smoking about 29 years ago. His smoking use included cigarettes. He started smoking about 57 years ago. He has a 30.00 pack-year smoking history. He has never used smokeless tobacco. He reports that he does not drink alcohol and does not use drugs.  Allergies:  Allergies  Allergen Reactions   Metformin Nausea And Vomiting   Niacin    Niacin And Related Other (See Comments)    Reaction: flushing  and burning side effects even with extended release    Medications: Prior to Admission medications   Medication Sig Start Date End Date Taking? Authorizing Provider  isosorbide mononitrate (IMDUR) 60 MG 24 hr tablet Take 1 tablet (60 mg total) by mouth 2 (two) times daily. 06/26/22 06/21/23 Yes BranchAlphonse Guild, MD  acarbose (PRECOSE) 50 MG tablet Take 50 mg by mouth in the morning, at noon, and at bedtime.  10/20/17   [provider]  allopurinol (ZYLOPRIM) 100 MG tablet Take 100 mg by mouth daily. 08/17/20   [provider]  amLODipine (NORVASC) 10 MG tablet Take 10 mg by mouth every evening.     [provider]  aspirin EC 81 MG tablet Take 1 tablet (81 mg total) by mouth daily. 09/14/19   Herminio Commons, MD  calcitRIOL (ROCALTROL) 0.25 MCG capsule Take 0.25 mcg by mouth every Sunday.  12/13/16   [provider]  carboxymethylcellulose (REFRESH PLUS) 0.5 % SOLN Place 1 drop into both eyes daily as needed (dry eyes).    [provider]  carvedilol (COREG) 6.25 MG tablet Take 6.25 mg by mouth 2 (two) times daily with a meal. 04/18/22 04/18/23  [provider]  cholecalciferol (VITAMIN D) 1000 units tablet Take 1,000 Units daily by mouth.    [provider]  cloNIDine (CATAPRES) 0.1 MG tablet Take 0.1 mg by mouth 3 (three) times daily. 02/15/20   [provider]  colchicine 0.6 MG tablet     [provider]  dicyclomine (BENTYL) 10 MG capsule     [provider]  diphenoxylate-atropine (LOMOTIL) 2.5-0.025 MG tablet Take 2 tablets by mouth 4 (four)  times daily as needed. 06/18/22   [provider]  epoetin alfa (EPOGEN) 3000 UNIT/ML injection Inject into the skin. 07/03/22   [provider]  erythromycin ophthalmic ointment APPLY 1 CM RIBBON INTO THE LOWER CONJUNCTIVAL SAC(S) IN THE AFFECTED EYE(S) BY OPHTHALMIC ROUTE 3 TIMES PER DAY FOR ONE WEEK    [provider]  furosemide  (LASIX) 40 MG tablet Take 1 tablet (40 mg total) by mouth daily. Patient taking differently: Take 40 mg by mouth 2 (two) times daily. 05/11/22   Pokhrel, Corrie Mckusick, MD  gabapentin (NEURONTIN) 300 MG capsule Take 300 mg by mouth 3 (three) times daily.  10/19/19   [provider]  hydrALAZINE (APRESOLINE) 100 MG tablet Take 200 mg by mouth 2 (two) times daily. 01/24/22   [provider]  HYDROcodone-acetaminophen (NORCO) 10-325 MG tablet Take 1-2 tablets by mouth 4 (four) times daily as needed for pain. 05/27/22   [provider]  insulin NPH Human (NOVOLIN N) 100 UNIT/ML injection Inject 0.18 mLs (18 Units total) into the skin every morning. Ands syringes 1/day Patient taking differently: Inject 20 Units into the skin daily as needed (if CBG is high). Per sliding scale: not provided 06/27/21   Renato Shin, MD  lactulose (CONSTULOSE) 10 GM/15ML solution     [provider]  LORazepam (ATIVAN) 0.5 MG tablet     [provider]  losartan (COZAAR) 25 MG tablet     [provider]  lubiprostone (AMITIZA) 8 MCG capsule Take 8 mcg by mouth daily as needed for constipation.    [provider]  meclizine (ANTIVERT) 25 MG tablet Take 25 mg by mouth 3 (three) times daily as needed for dizziness.    [provider]  mupirocin ointment (BACTROBAN) 2 % SMARTSIG:sparingly Topical 3 Times Daily 07/10/22   [provider]  nitroGLYCERIN (NITROSTAT) 0.4 MG SL tablet Place 1 tablet (0.4 mg total) under the tongue every 5 (five) minutes as needed for chest pain. PLACE ONE TABLET UNDER THE TONGUE EVERY 5 MINUTES FOR 3 DOSES AS NEEDED Strength: 0.4 mg 02/07/22   Arnoldo Lenis, MD  ondansetron (ZOFRAN) 4 MG tablet TAKE ONE TABLET (4MG TOTAL) BY MOUTH EVERY 8 HOURS AS NEEDED FOR NAUSEA OR VOMITING 04/24/21   Annitta Needs, NP  pantoprazole (PROTONIX) 40 MG tablet TAKE ONE TABLET (40MG TOTAL) BY MOUTH TWO TIMES DAILY BEFORE A MEAL Patient taking  differently: Take 40 mg by mouth 2 (two) times daily. 05/11/21   Erenest Rasher, PA-C  pravastatin (PRAVACHOL) 40 MG tablet Take 1 tablet (40 mg total) by mouth every evening. 07/31/21 02/09/28  Imogene Burn, PA-C  RESTASIS 0.05 % ophthalmic emulsion 1 drop 2 (two) times daily. 06/19/22   [provider]  silver sulfADIAZINE (SSD) 1 % cream     [provider]  sitaGLIPtin (JANUVIA) 25 MG tablet     [provider]  tiZANidine (ZANAFLEX) 4 MG tablet     [provider]  TRUE METRIX BLOOD GLUCOSE TEST test strip USE TO TEST BLOOD SUGAR UP TO TWICE DAILY OR AS DIRECTED 08/31/21   Renato Shin, MD    I have reviewed the patient's current medications.  Labs:  Results for orders placed or performed during the hospital encounter of 07/28/22 (from the past 48 hour(s))  Comprehensive metabolic panel     Status: Abnormal   Collection Time: 07/28/22 11:28 PM  Result Value Ref Range   Sodium 137 135 - 145 mmol/L  Potassium 3.2 (L) 3.5 - 5.1 mmol/L   Chloride 103 98 - 111 mmol/L   CO2 23 22 - 32 mmol/L   Glucose, Bld 170 (H) 70 - 99 mg/dL    Comment: Glucose reference range applies only to samples taken after fasting for at least 8 hours.   BUN 74 (H) 8 - 23 mg/dL   Creatinine, Ser 3.38 (H) 0.61 - 1.24 mg/dL   Calcium 8.9 8.9 - 10.3 mg/dL   Total Protein 7.6 6.5 - 8.1 g/dL   Albumin 4.2 3.5 - 5.0 g/dL   AST 41 15 - 41 U/L   ALT 39 0 - 44 U/L   Alkaline Phosphatase 59 38 - 126 U/L   Total Bilirubin 0.4 0.3 - 1.2 mg/dL   GFR, Estimated 18 (L) >60 mL/min    Comment: (NOTE) Calculated using the CKD-EPI Creatinine Equation (2021)    Anion gap 11 5 - 15    Comment: Performed at [facility], [address] [place], [place] [address]  Troponin I (High Sensitivity)     Status: Abnormal   Collection Time: 07/28/22 11:28 PM  Result Value Ref Range   Troponin I (High Sensitivity) 42 (H) <18 ng/L    Comment: (NOTE) Elevated high sensitivity troponin I (hsTnI)  values and significant  changes across serial measurements may suggest ACS but many other  chronic and acute conditions are known to elevate hsTnI results.  Refer to the "Links" section for chest pain algorithms and additional  guidance. Performed at [facility], [address] [place], [place] [address]   CBC with Differential     Status: Abnormal   Collection Time: 07/28/22 11:28 PM  Result Value Ref Range   WBC 11.6 (H) 4.0 - 10.5 K/uL   RBC 2.57 (L) 4.22 - 5.81 MIL/uL   Hemoglobin 8.2 (L) 13.0 - 17.0 g/dL   HCT 25.0 (L) 39.0 - 52.0 %   MCV 97.3 80.0 - 100.0 fL   MCH 31.9 26.0 - 34.0 pg   MCHC 32.8 30.0 - 36.0 g/dL   RDW 16.4 (H) 11.5 - 15.5 %   Platelets 227 150 - 400 K/uL   nRBC 0.0 0.0 - 0.2 %   Neutrophils Relative % 78 %   Neutro Abs 9.0 (H) 1.7 - 7.7 K/uL   Lymphocytes Relative 10 %   Lymphs Abs 1.1 0.7 - 4.0 K/uL   Monocytes Relative 10 %   Monocytes Absolute 1.1 (H) 0.1 - 1.0 K/uL   Eosinophils Relative 2 %   Eosinophils Absolute 0.3 0.0 - 0.5 K/uL   Basophils Relative 0 %   Basophils Absolute 0.0 0.0 - 0.1 K/uL   Immature Granulocytes 0 %   Abs Immature Granulocytes 0.04 0.00 - 0.07 K/uL    Comment: Performed at [facility], [address], [place], [place] [address]  Brain natriuretic peptide     Status: Abnormal   Collection Time: 07/28/22 11:38 PM  Result Value Ref Range   B Natriuretic Peptide 651.0 (H) 0.0 - 100.0 pg/mL    Comment: Performed at [facility], [address], [place], [place] [address]  Troponin I (High Sensitivity)     Status: Abnormal   Collection Time: 07/29/22  2:13 AM  Result Value Ref Range   Troponin I (High Sensitivity) 41 (H) <18 ng/L    Comment: (NOTE) Elevated high sensitivity troponin I (hsTnI) values and significant  changes across serial measurements may suggest ACS but many other  chronic and acute conditions are known  to elevate hsTnI results.  Refer to the "Links" section for chest pain algorithms and additional   guidance. Performed at [facility], [address] [place], [place] [address]   Magnesium     Status: None   Collection Time: 07/29/22  2:13 AM  Result Value Ref Range   Magnesium 1.9 1.7 - 2.4 mg/dL    Comment: Performed at [facility], [address] [place], [place] [address]  Urinalysis, Routine w reflex microscopic Urine, Clean Catch     Status: Abnormal   Collection Time: 07/29/22  2:30 AM  Result Value Ref Range   Color, Urine STRAW (A) YELLOW   APPearance CLEAR CLEAR   Specific Gravity, Urine 1.005 1.005 - 1.030   pH 7.0 5.0 - 8.0   Glucose, UA NEGATIVE NEGATIVE mg/dL   Hgb urine dipstick MODERATE (A) NEGATIVE   Bilirubin Urine NEGATIVE NEGATIVE   Ketones, ur NEGATIVE NEGATIVE mg/dL   Protein, ur NEGATIVE NEGATIVE mg/dL   Nitrite NEGATIVE NEGATIVE   Leukocytes,Ua NEGATIVE NEGATIVE   RBC / HPF 0-5 0 - 5 RBC/hpf   WBC, UA 0-5 0 - 5 WBC/hpf   Bacteria, UA NONE SEEN NONE SEEN   Squamous Epithelial / LPF 0-5 0 - 5    Comment: Performed at [facility], [address] [place], [place] [address]  Glucose, capillary     Status: Abnormal   Collection Time: 07/29/22  7:10 AM  Result Value Ref Range   Glucose-Capillary 178 (H) 70 - 99 mg/dL    Comment: Glucose reference range applies only to samples taken after fasting for at least 8 hours.     ROS:  Review of systems not obtained due to patient factors.  Physical Exam: Vitals:   07/29/22 0840 07/29/22 0852  BP:  (!) 194/97  Pulse: 100 (!) 102  Resp: (!) 31 (!) 29  Temp:  99.5 F (37.5 C)  SpO2: 99% 96%     General:  well developed BM -  on bipap-  some distress-  unable to really have conversation HEENT: PERRLA, EOMI, mucous membranes moist  Neck: positive for JVD Heart: tachy Lungs: CBS bilat-  rapid resp rate Abdomen: slightly distended Extremities:some pitting edema to dep areas Skin: warm and dry Neuro: grossly intact Has external urinary catheter  Assessment/Plan: 76 year old BM with many medical  issues including CAD and CKD-  presenting with SOB and CP 1.Renal- advanced CKD at baseline-  will complicate things-  now appears to be volume overloaded leading to SOB.  Given his low GFR will need very high dose diuretics it seems to bring clinical improvement-  will inc to max dosing and follow.  There are no acute indications for dialysis.  Cardiology note says pt has refused dialysis -  I dont see other documentation of this and cannot have conversation now with patient -  need to get more comfortable from a resp standpoint to do that if able  2. Hypertension/volume  - hypertensive and volume overloaded-  increase diuretics to max and follow 3. Anemia  - is not helping his SOB-  been getting iron and ESA as OP-  current hgb 8.2-  have low threshold for transfusion  4. Hypokalemia-  on replacement- likely will go donw further if diuresis is successful    Louis Meckel 07/29/2022, 9:09 AM

## 2022-07-29 NOTE — Progress Notes (Signed)
PT Cancellation Note  Patient Details Name: [name] MRN: [id number] DOB: [birth year]   Cancelled Treatment:    Reason Eval/Treat Not Completed: Medical issues which prohibited therapy.  Patient transferred to a higher level of care and will need new PT consult to resume therapy when patient is medically stable.  Thank you.    8:38 AM, 07/29/22 [name], MPT Physical Therapist with [facility] [phone] office [phone] mobile phone

## 2022-07-29 NOTE — Progress Notes (Signed)
OK to use OG tube per verbal from Dr Halford Chessman.

## 2022-07-29 NOTE — Progress Notes (Signed)
*  PRELIMINARY RESULTS* Echocardiogram Limited 2D Echocardiogram has been performed.  [name] 07/29/2022, 12:19 PM

## 2022-07-29 NOTE — ED Provider Notes (Signed)
[facility] EMERGENCY DEPARTMENT Provider Note  CSN: [id number] Arrival date & time: 07/28/22 2312  Chief Complaint(s) Chest Pain (sob)  HPI [name] is a 76 y.o. male with PMH CAD status post DES placement, CKD 3, T2DM, CHF, HTN who presents emergency department for evaluation of shortness of breath and chest pain.  States that over the last 48 hours he has had worsening chest pain and shortness of breath with associated leg swelling and orthopnea.  He states that he had previous symptoms like this and he was started on Imdur by his cardiologist that initially helped but he feels like his symptoms have returned.  He states that he is not on any diuretic therapy at this time.  Denies abdominal pain, nausea, vomiting, headache, fever or other systemic symptoms.   Past Medical History Past Medical History:  Diagnosis Date   Anginal pain ([place])    Anxiety    Benign prostatic hypertrophy    Nocturia   CAD (coronary artery disease)    a.  NSTEMI 10/2012 s/p DES to LAD & DES to 1st diagonal with residual diffuse nonobstructive dz in LCx/RCA    Chronic kidney disease, stage 3, mod decreased GFR (HCC)    Creatinine of 1.5 in 03/2009, STAGE 4 now   Degenerative joint disease    Depression    hx of   Diabetes mellitus, type II ([place]) 99/77/4142   Diastolic heart failure    Pedal edema, "a long time ago"   Erectile dysfunction    Flu 2013   hx of   GERD (gastroesophageal reflux disease)    Gout    Hemorrhoids    High cholesterol    Hypertension    Exercise induced   Myocardial infarction ([place])    Neuropathy    bilateral legs   NSVT (nonsustained ventricular tachycardia) ([place])    Exercise induced   Obesity    Tobacco abuse, 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, quit 1994   Tubular adenoma 2014   Vertigo    everyday   Patient Active Problem List   Diagnosis Date Noted   B12 deficiency 07/18/2022   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/2022   Diabetic neuropathy ([place])  05/10/2022   Elevated troponin 05/10/2022   Type 2 diabetes mellitus with hyperglycemia ([place]) 05/10/2022   Other intervertebral disc degeneration, lumbar region 02/06/2022   Chronic gout, unspecified, 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ophi) 02/06/2022   Body mass index (BMI) 35.0-35.9, adult 02/06/2022   Obesity 02/06/2022   Primary generalized (osteo)arthritis 02/06/2022   Chest pain 02/06/2022   Polyneuropathy 10/29/2021   Bilateral lower extremity edema 08/28/2021   Other chronic pain 08/28/2021   Lumbago with sciatica, right side 08/28/2021   Weakness of both lower extremities 08/28/2021   Body mass index (BMI) 33.0-33.9, adult 08/28/2021   Constipation 02/15/2021   Abdominal pain 01/02/2021   Diabetes mellitus ([place]) 11/09/2020   Vertigo 11/09/2020   Hypertensive disorder 11/09/2020   Hypercholesterolemia 11/09/2020   H/O adenomatous polyp of colon 05/05/2020   Iron deficiency anemia 05/05/2020   Chronic kidney disease, stage IV (severe) ([place]) 12/14/2019   Acute gouty arthritis 10/25/2019   Sepsis ([place]) 04/02/2019   Leukocytosis 04/02/2019   Cellulitis of right hand 04/01/2019   Osteoarthritis of ankle and foot 02/08/2019   Peripheral edema    CHF (congestive heart failure) ([place]) 07/05/2015   Acute congestive heart failure ([place]) 07/04/2015   Acute on chronic diastolic congestive heart failure ([place]) 07/04/2015   Mixed hyperlipidemia  07/04/2015   Anxiety state 07/04/2015   GERD (gastroesophageal reflux disease) 07/04/2015   Heart block 07/04/2015   Gastroesophageal reflux disease 07/04/2015   Edema 02/72/5366   Diastolic CHF, chronic (HCC) 03/11/2015   Chronic diastolic heart failure ([place]) 03/11/2015   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   Muscle weakness (generalized) 01/13/2014   Knee stiffness 01/13/2014   Gout 12/11/2013   Cellulitis 11/19/2013   Expected blood loss anemia 11/17/2013   Obese 11/17/2013   S/P right TKA 11/15/2013   Anemia, normocytic normochromic 01/12/2013   Normocytic  normochromic anemia 01/12/2013   Acute kidney injury superimposed on chronic kidney disease ([place]) 12/10/2012   Diabetes mellitus type 2 in obese ([place]) 12/10/2012   CAD S/P percutaneous coronary angioplasty 11/24/2012   Overweight(278.02) 11/17/2012   Obstructive sleep apnea 11/15/2012   Chronic kidney disease, stage 3, cardiorenal syndrome 08/23/2011   Chronic kidney disease, stage 3 unspecified ([place]) 08/23/2011   Essential hypertension    Home Medication(s) Prior to Admission medications   Medication Sig Start Date End Date Taking? Authorizing Provider  isosorbide mononitrate (IMDUR) 60 MG 24 hr tablet Take 1 tablet (60 mg total) by mouth 2 (two) times daily. 06/26/22 06/21/23 Yes BranchAlphonse Guild, MD  acarbose (PRECOSE) 50 MG tablet Take 50 mg by mouth in the morning, at noon, and at bedtime.  10/20/17   [provider]  allopurinol (ZYLOPRIM) 100 MG tablet Take 100 mg by mouth daily. 08/17/20   [provider]  amLODipine (NORVASC) 10 MG tablet Take 10 mg by mouth every evening.     [provider]  aspirin EC 81 MG tablet Take 1 tablet (81 mg total) by mouth daily. 09/14/19   Herminio Commons, MD  calcitRIOL (ROCALTROL) 0.25 MCG capsule Take 0.25 mcg by mouth every Sunday.  12/13/16   [provider]  carboxymethylcellulose (REFRESH PLUS) 0.5 % SOLN Place 1 drop into both eyes daily as needed (dry eyes).    [provider]  carvedilol (COREG) 6.25 MG tablet Take 6.25 mg by mouth 2 (two) times daily with a meal. 04/18/22 04/18/23  [provider]  cholecalciferol (VITAMIN D) 1000 units tablet Take 1,000 Units daily by mouth.    [provider]  cloNIDine (CATAPRES) 0.1 MG tablet Take 0.1 mg by mouth 3 (three) times daily. 02/15/20   [provider]  colchicine 0.6 MG tablet     [provider]  dicyclomine (BENTYL) 10 MG capsule     [provider]  diphenoxylate-atropine (LOMOTIL) 2.5-0.025 MG tablet  Take 2 tablets by mouth 4 (four) times daily as needed. 06/18/22   [provider]  epoetin alfa (EPOGEN) 3000 UNIT/ML injection Inject into the skin. 07/03/22   [provider]  erythromycin ophthalmic ointment APPLY 1 CM RIBBON INTO THE LOWER CONJUNCTIVAL SAC(S) IN THE AFFECTED EYE(S) BY OPHTHALMIC ROUTE 3 TIMES PER DAY FOR ONE WEEK    [provider]  furosemide (LASIX) 40 MG tablet Take 1 tablet (40 mg total) by mouth daily. Patient taking differently: Take 40 mg by mouth 2 (two) times daily. 05/11/22   Pokhrel, Corrie Mckusick, MD  gabapentin (NEURONTIN) 300 MG capsule Take 300 mg by mouth 3 (three) times daily.  10/19/19   [provider]  hydrALAZINE (APRESOLINE) 100 MG tablet Take 200 mg by mouth 2 (two) times daily. 01/24/22   [provider]  HYDROcodone-acetaminophen (NORCO) 10-325 MG tablet Take 1-2 tablets by mouth 4 (four) times daily as needed for pain. 05/27/22  [provider]  insulin NPH Human (NOVOLIN N) 100 UNIT/ML injection Inject 0.18 mLs (18 Units total) into the skin every morning. Ands syringes 1/day Patient taking differently: Inject 20 Units into the skin daily as needed (if CBG is high). Per sliding scale: not provided 06/27/21   Renato Shin, MD  lactulose (CONSTULOSE) 10 GM/15ML solution     [provider]  LORazepam (ATIVAN) 0.5 MG tablet     [provider]  losartan (COZAAR) 25 MG tablet     [provider]  lubiprostone (AMITIZA) 8 MCG capsule Take 8 mcg by mouth daily as needed for constipation.    [provider]  meclizine (ANTIVERT) 25 MG tablet Take 25 mg by mouth 3 (three) times daily as needed for dizziness.    [provider]  mupirocin ointment (BACTROBAN) 2 % SMARTSIG:sparingly Topical 3 Times Daily 07/10/22   [provider]  nitroGLYCERIN (NITROSTAT) 0.4 MG SL tablet Place 1 tablet (0.4 mg total) under the tongue every 5 (five) minutes as needed for chest pain.  PLACE ONE TABLET UNDER THE TONGUE EVERY 5 MINUTES FOR 3 DOSES AS NEEDED Strength: 0.4 mg 02/07/22   Arnoldo Lenis, MD  ondansetron (ZOFRAN) 4 MG tablet TAKE ONE TABLET (4MG TOTAL) BY MOUTH EVERY 8 HOURS AS NEEDED FOR NAUSEA OR VOMITING 04/24/21   Annitta Needs, NP  pantoprazole (PROTONIX) 40 MG tablet TAKE ONE TABLET (40MG TOTAL) BY MOUTH TWO TIMES DAILY BEFORE A MEAL Patient taking differently: Take 40 mg by mouth 2 (two) times daily. 05/11/21   Erenest Rasher, PA-C  pravastatin (PRAVACHOL) 40 MG tablet Take 1 tablet (40 mg total) by mouth every evening. 07/31/21 02/09/28  Imogene Burn, PA-C  RESTASIS 0.05 % ophthalmic emulsion 1 drop 2 (two) times daily. 06/19/22   [provider]  silver sulfADIAZINE (SSD) 1 % cream     [provider]  sitaGLIPtin (JANUVIA) 25 MG tablet     [provider]  tiZANidine (ZANAFLEX) 4 MG tablet     [provider]  TRUE METRIX BLOOD GLUCOSE TEST test strip USE TO TEST BLOOD SUGAR UP TO TWICE DAILY OR AS DIRECTED 08/31/21   Renato Shin, MD                                                                                                                                    Past Surgical History Past Surgical History:  Procedure Laterality Date   BACK SURGERY  2007   x2   BIOPSY  02/05/2021   Procedure: BIOPSY;  Surgeon: Daneil Dolin, MD;  Location: [facility];  Service: Endoscopy;;   CARDIAC CATHETERIZATION     with stent placement   COLONOSCOPY WITH ESOPHAGOGASTRODUODENOSCOPY (EGD) N/A 11/23/2013   Dr. Oneida Alar- TCS= left sided colitis and mild proctitis likely due to ischemic colitis, moderate internal hemorrhoids, small nodule in the rectum, bx=tubular adenoma, EGD=-mild non-erosive  gastritis   COLONOSCOPY WITH PROPOFOL N/A 07/24/2020   hemorrhoids,  rectosigmoid lipoma, small adenoma removed.   CORONARY ANGIOPLASTY     ESOPHAGOGASTRODUODENOSCOPY (EGD) WITH PROPOFOL N/A 02/05/2021   Normal esophagus, erythematous  mucosa in entire stomach, no obvious ulcer, s/p biopsy. Normal duodenum. Negative H.pylori.    HERNIA REPAIR     JOINT REPLACEMENT     LEFT HEART CATHETERIZATION WITH CORONARY ANGIOGRAM N/A 11/16/2012   Procedure: LEFT HEART CATHETERIZATION WITH CORONARY ANGIOGRAM;  Surgeon: Sherren Mocha, MD;  [facility] C[facility] LAB;  Service: Cardiovascular;  Laterality: N/A;   PERCUTANEOUS CORONARY STENT INTERVENTION (PCI-S)  11/16/2012   Procedure: PERCUTANEOUS CORONARY STENT INTERVENTION (PCI-S);  Surgeon: Sherren Mocha, MD;  Location: [facility] CATH LAB;  Service: Cardiovascular;;   POLYPECTOMY  07/24/2020   Procedure: POLYPECTOMY;  Surgeon: Daneil Dolin, MD;  Location: [facility];  Service: Endoscopy;;   SPINAL CORD STIMULATOR INSERTION N/A 06/10/2022   Procedure: Spinal cord stimulator placement;  Surgeon: Newman Pies, MD;  Location: [place];  Service: Neurosurgery;  Laterality: N/A;  RM 19/ to follow   THUMB AMPUTATION Left    50 years ago   TOTAL KNEE ARTHROPLASTY Right 11/15/2013   Procedure: RIGHT TOTAL KNEE ARTHROPLASTY;  Surgeon: Mauri Pole, MD;  Location: [facility] ORS;  Service: Orthopedics;  Laterality: Right;   TOTAL KNEE ARTHROPLASTY Left 03/07/2015   Procedure: LEFT TOTAL KNEE ARTHROPLASTY;  Surgeon: Paralee Cancel, MD;  Location: [facility] ORS;  Service: Orthopedics;  Laterality: Left;   Family History Family History  Problem Relation Age of Onset   Hypertension Other    Cancer Other    Heart disease Other    Diabetes Other    Hypertension Mother    Cancer Father        brain   CAD Brother    Colon cancer Neg Hx     Social History Social History   Tobacco Use   Smoking status: Former    Packs/day: 1.00    Years: 30.00    Total pack years: 30.00    Types: Cigarettes    Start date: 01/22/1965    Quit date: 12/23/1992    Years since quitting: 29.6   Smokeless tobacco: Never  Vaping Use   Vaping Use: Never used  Substance Use Topics   Alcohol use: No    Alcohol/week: 0.0 standard  drinks of alcohol   Drug use: No   Allergies Metformin, Niacin, and Niacin and related  Review of Systems Review of Systems  Respiratory:  Positive for shortness of breath.   Cardiovascular:  Positive for chest pain.    Physical Exam Vital Signs  I have reviewed the triage vital signs BP (!) 140/82   Pulse 83   Temp (!) 97.5 F (36.4 C) (Oral)   Resp 19   Ht 6' 2"  (1.88 m)   Wt 126.4 kg   SpO2 92%   BMI 35.77 kg/m   Physical Exam Constitutional:      General: He is not in acute distress.    Appearance: Normal appearance.  HENT:     Head: Normocephalic and atraumatic.     Nose: No congestion or rhinorrhea.  Eyes:     General:        Right eye: No discharge.        Left eye: No discharge.     Extraocular Movements: Extraocular movements intact.     Pupils: Pupils are equal, round, and reactive to light.  Cardiovascular:  Rate and Rhythm: Normal rate and regular rhythm.     Heart sounds: No murmur heard. Pulmonary:     Effort: No respiratory distress.     Breath sounds: Rales present. No wheezing.  Abdominal:     General: There is no distension.     Tenderness: There is no abdominal tenderness.  Musculoskeletal:        General: Normal range of motion.     Cervical back: Normal range of motion.     Right lower leg: Edema present.     Left lower leg: Edema present.  Skin:    General: Skin is warm and dry.  Neurological:     General: No focal deficit present.     Mental Status: He is alert.     ED Results and Treatments Labs (all labs ordered are listed, but only abnormal results are displayed) Labs Reviewed  COMPREHENSIVE METABOLIC PANEL - Abnormal; Notable for the following components:      Result Value   Potassium 3.2 (*)    Glucose, Bld 170 (*)    BUN 74 (*)    Creatinine, Ser 3.38 (*)    GFR, Estimated 18 (*)    All other components within normal limits  CBC WITH DIFFERENTIAL/PLATELET - Abnormal; Notable for the following components:   WBC  11.6 (*)    RBC 2.57 (*)    Hemoglobin 8.2 (*)    HCT 25.0 (*)    RDW 16.4 (*)    Neutro Abs 9.0 (*)    Monocytes Absolute 1.1 (*)    All other components within normal limits  BRAIN NATRIURETIC PEPTIDE - Abnormal; Notable for the following components:   B Natriuretic Peptide 651.0 (*)    All other components within normal limits  TROPONIN I (HIGH SENSITIVITY) - Abnormal; Notable for the following components:   Troponin I (High Sensitivity) 42 (*)    All other components within normal limits                                                                                                                          Radiology DG Chest Portable 1 View  Result Date: 07/28/2022 CLINICAL DATA:  Shortness of breath, chest pain EXAM: PORTABLE CHEST 1 VIEW COMPARISON:  05/29/2022 FINDINGS: Cardiomegaly, vascular congestion. No confluent opacities, effusions or edema. No acute bony abnormality. IMPRESSION: Cardiomegaly, vascular congestion. Electronically Signed   By: Rolm Baptise M.D.   On: 07/28/2022 23:46    Pertinent labs & imaging results that were available during my care of the patient were reviewed by me and considered in my medical decision making (see MDM for details).  Medications Ordered in ED Medications - No data to display  Procedures Procedures  (including critical care time)  Medical Decision Making / ED Course   This patient presents to the ED for concern of chest pain, shortness of breath, this involves an extensive number of treatment options, and is a complaint that carries with it a high risk of complications and morbidity.  The differential diagnosis includes CHF exacerbation, ACS, PE, pneumonia  MDM: Patient seen in the emergency room for evaluation of chest pain, shortness of breath.  Physical exam with rales in bilateral bases and significant  bilateral lower extremity edema.  Laboratory evaluation with a leukocytosis to 11.6, hemoglobin 8.2 which is baseline for this patient, mild hypokalemia to 3.2 which was repleted in the emergency department, BUN 74, creatinine 3.38 which is baseline.  Initial high-sensitivity troponin 42 with delta troponin 41.  BNP elevated to 651.  Chest x-ray with interstitial pulmonary edema.  ECG nonischemic.  Patient started on IV Lasix and admitted to the hospital service for CHF exacerbation.   Additional history obtained:  -External records from outside source obtained and reviewed including: Chart review including previous notes, labs, imaging, consultation notes   Lab Tests: -I ordered, reviewed, and interpreted labs.   The pertinent results include:   Labs Reviewed  COMPREHENSIVE METABOLIC PANEL - Abnormal; Notable for the following components:      Result Value   Potassium 3.2 (*)    Glucose, Bld 170 (*)    BUN 74 (*)    Creatinine, Ser 3.38 (*)    GFR, Estimated 18 (*)    All other components within normal limits  CBC WITH DIFFERENTIAL/PLATELET - Abnormal; Notable for the following components:   WBC 11.6 (*)    RBC 2.57 (*)    Hemoglobin 8.2 (*)    HCT 25.0 (*)    RDW 16.4 (*)    Neutro Abs 9.0 (*)    Monocytes Absolute 1.1 (*)    All other components within normal limits  BRAIN NATRIURETIC PEPTIDE - Abnormal; Notable for the following components:   B Natriuretic Peptide 651.0 (*)    All other components within normal limits  TROPONIN I (HIGH SENSITIVITY) - Abnormal; Notable for the following components:   Troponin I (High Sensitivity) 42 (*)    All other components within normal limits      EKG   EKG Interpretation  Date/Time:    Ventricular Rate:    PR Interval:    QRS Duration:   QT Interval:    QTC Calculation:   R Axis:     Text Interpretation:           Imaging Studies ordered: I ordered imaging studies including chest x-ray I independently visualized and  interpreted imaging. I agree with the radiologist interpretation   Medicines ordered and prescription drug management: No orders of the defined types were placed in this encounter.   -I have reviewed the patients home medicines and have made adjustments as needed  Critical interventions none   Cardiac Monitoring: The patient was maintained on a cardiac monitor.  I personally viewed and interpreted the cardiac monitored which showed an underlying rhythm of: NSR  Social Determinants of Health:  Factors impacting patients care include: none   Reevaluation: After the interventions noted above, I reevaluated the patient and found that they have :stayed the same  Co morbidities that complicate the patient evaluation  Past Medical History:  Diagnosis Date   Anginal pain ([place])    Anxiety    Benign prostatic hypertrophy    Nocturia  CAD (coronary artery disease)    a.  NSTEMI 10/2012 s/p DES to LAD & DES to 1st diagonal with residual diffuse nonobstructive dz in LCx/RCA    Chronic kidney disease, stage 3, mod decreased GFR (HCC)    Creatinine of 1.5 in 03/2009, STAGE 4 now   Degenerative joint disease    Depression    hx of   Diabetes mellitus, type II ([place]) 88/41/6606   Diastolic heart failure    Pedal edema, "a long time ago"   Erectile dysfunction    Flu 2013   hx of   GERD (gastroesophageal reflux disease)    Gout    Hemorrhoids    High cholesterol    Hypertension    Exercise induced   Myocardial infarction ([place])    Neuropathy    bilateral legs   NSVT (nonsustained ventricular tachycardia) ([place])    Exercise induced   Obesity    Tobacco abuse, 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, quit 1994   Tubular adenoma 2014   Vertigo    everyday      Dispostion: I considered admission for this patient, and due to fluid overload and need for diuresis, patient will require hospital admission     Final Clinical Impression(s) / ED Diagnoses Final diagnoses:  None      @PCDICTATION @    Teressa Lower, MD 07/29/22 [phone]

## 2022-07-30 ENCOUNTER — Inpatient Hospital Stay (HOSPITAL_COMMUNITY): Payer: Medicare Other

## 2022-07-30 DIAGNOSIS — I501 Left ventricular failure: Secondary | ICD-10-CM | POA: Diagnosis not present

## 2022-07-30 DIAGNOSIS — I5021 Acute systolic (congestive) heart failure: Secondary | ICD-10-CM | POA: Diagnosis not present

## 2022-07-30 DIAGNOSIS — I2510 Atherosclerotic heart disease of native coronary artery without angina pectoris: Secondary | ICD-10-CM | POA: Diagnosis not present

## 2022-07-30 DIAGNOSIS — J9601 Acute respiratory failure with hypoxia: Secondary | ICD-10-CM | POA: Diagnosis not present

## 2022-07-30 DIAGNOSIS — N184 Chronic kidney disease, stage 4 (severe): Secondary | ICD-10-CM | POA: Diagnosis not present

## 2022-07-30 DIAGNOSIS — I200 Unstable angina: Secondary | ICD-10-CM | POA: Diagnosis not present

## 2022-07-30 DIAGNOSIS — I5020 Unspecified systolic (congestive) heart failure: Secondary | ICD-10-CM

## 2022-07-30 DIAGNOSIS — D649 Anemia, unspecified: Secondary | ICD-10-CM | POA: Diagnosis not present

## 2022-07-30 DIAGNOSIS — I5033 Acute on chronic diastolic (congestive) heart failure: Secondary | ICD-10-CM | POA: Diagnosis not present

## 2022-07-30 DIAGNOSIS — Z9861 Coronary angioplasty status: Secondary | ICD-10-CM | POA: Diagnosis not present

## 2022-07-30 LAB — BLOOD GAS, ARTERIAL
Acid-Base Excess: 5 mmol/L — ABNORMAL HIGH (ref 0.0–2.0)
Bicarbonate: 29.1 mmol/L — ABNORMAL HIGH (ref 20.0–28.0)
Drawn by: 35033
FIO2: 45 %
O2 Saturation: 97.8 %
Patient temperature: 37
pCO2 arterial: 40 mmHg (ref 32–48)
pH, Arterial: 7.47 — ABNORMAL HIGH (ref 7.35–7.45)
pO2, Arterial: 75 mmHg — ABNORMAL LOW (ref 83–108)

## 2022-07-30 LAB — GLUCOSE, CAPILLARY
Glucose-Capillary: 117 mg/dL — ABNORMAL HIGH (ref 70–99)
Glucose-Capillary: 120 mg/dL — ABNORMAL HIGH (ref 70–99)
Glucose-Capillary: 126 mg/dL — ABNORMAL HIGH (ref 70–99)
Glucose-Capillary: 132 mg/dL — ABNORMAL HIGH (ref 70–99)
Glucose-Capillary: 132 mg/dL — ABNORMAL HIGH (ref 70–99)
Glucose-Capillary: 141 mg/dL — ABNORMAL HIGH (ref 70–99)
Glucose-Capillary: 159 mg/dL — ABNORMAL HIGH (ref 70–99)

## 2022-07-30 LAB — BASIC METABOLIC PANEL
Anion gap: 12 (ref 5–15)
BUN: 79 mg/dL — ABNORMAL HIGH (ref 8–23)
CO2: 27 mmol/L (ref 22–32)
Calcium: 8.9 mg/dL (ref 8.9–10.3)
Chloride: 104 mmol/L (ref 98–111)
Creatinine, Ser: 3.28 mg/dL — ABNORMAL HIGH (ref 0.61–1.24)
GFR, Estimated: 19 mL/min — ABNORMAL LOW (ref >60)
Glucose, Bld: 145 mg/dL — ABNORMAL HIGH (ref 70–99)
Potassium: 3.7 mmol/L (ref 3.5–5.1)
Sodium: 143 mmol/L (ref 135–145)

## 2022-07-30 LAB — CBC
HCT: 24.8 % — ABNORMAL LOW (ref 39.0–52.0)
Hemoglobin: 8 g/dL — ABNORMAL LOW (ref 13.0–17.0)
MCH: 31.9 pg (ref 26.0–34.0)
MCHC: 32.3 g/dL (ref 30.0–36.0)
MCV: 98.8 fL (ref 80.0–100.0)
Platelets: 186 10*3/uL (ref 150–400)
RBC: 2.51 MIL/uL — ABNORMAL LOW (ref 4.22–5.81)
RDW: 16.5 % — ABNORMAL HIGH (ref 11.5–15.5)
WBC: 10.4 10*3/uL (ref 4.0–10.5)
nRBC: 0 % (ref 0.0–0.2)

## 2022-07-30 LAB — MAGNESIUM: Magnesium: 2.1 mg/dL (ref 1.7–2.4)

## 2022-07-30 MED ORDER — METOLAZONE 5 MG PO TABS
5.0000 mg | ORAL_TABLET | Freq: Once | ORAL | Status: AC
  Administered 2022-07-30: 5 mg
  Filled 2022-07-30: qty 1

## 2022-07-30 MED ORDER — POTASSIUM CHLORIDE 10 MEQ/100ML IV SOLN
10.0000 meq | INTRAVENOUS | Status: AC
  Administered 2022-07-30 (×4): 10 meq via INTRAVENOUS
  Filled 2022-07-30 (×3): qty 100

## 2022-07-30 MED ORDER — HYDRALAZINE HCL 25 MG PO TABS
50.0000 mg | ORAL_TABLET | Freq: Three times a day (TID) | ORAL | Status: DC
  Administered 2022-07-30 (×3): 50 mg
  Filled 2022-07-30 (×3): qty 2

## 2022-07-30 MED ORDER — VITAL HIGH PROTEIN PO LIQD: 1000.0000 mL | ORAL | Status: DC

## 2022-07-30 MED ORDER — CLONIDINE HCL 0.1 MG PO TABS
0.1000 mg | ORAL_TABLET | Freq: Two times a day (BID) | ORAL | Status: DC
  Administered 2022-07-30 – 2022-07-31 (×3): 0.1 mg
  Filled 2022-07-30 (×3): qty 1

## 2022-07-30 MED ORDER — NA FERRIC GLUC CPLX IN SUCROSE 12.5 MG/ML IV SOLN
250.0000 mg | Freq: Every day | INTRAVENOUS | Status: AC
  Administered 2022-07-30 – 2022-08-02 (×4): 250 mg via INTRAVENOUS
  Filled 2022-07-30 (×5): qty 20

## 2022-07-30 MED ORDER — VITAL HIGH PROTEIN PO LIQD
1000.0000 mL | ORAL | Status: DC
  Administered 2022-07-30: 1000 mL

## 2022-07-30 MED ORDER — PROSOURCE TF20 ENFIT COMPATIBL EN LIQD
60.0000 mL | Freq: Every day | ENTERAL | Status: DC
Start: 2022-07-30 — End: 2022-07-30
  Administered 2022-07-30: 60 mL
  Filled 2022-07-30: qty 60

## 2022-07-30 MED ORDER — ASPIRIN 81 MG PO CHEW
81.0000 mg | CHEWABLE_TABLET | Freq: Every day | ORAL | Status: DC
  Administered 2022-07-30 – 2022-07-31 (×2): 81 mg
  Filled 2022-07-30 (×2): qty 1

## 2022-07-30 MED ORDER — AMLODIPINE BESYLATE 5 MG PO TABS
5.0000 mg | ORAL_TABLET | Freq: Every evening | ORAL | Status: DC
Start: 2022-07-30 — End: 2022-07-30

## 2022-07-30 MED ORDER — DARBEPOETIN ALFA 200 MCG/0.4ML IJ SOSY
200.0000 ug | PREFILLED_SYRINGE | INTRAMUSCULAR | Status: DC
  Administered 2022-07-30 – 2022-08-06 (×2): 200 ug via SUBCUTANEOUS
  Filled 2022-07-30 (×2): qty 0.4

## 2022-07-30 NOTE — Progress Notes (Signed)
Patient had watch on left wrist, removed and placed in patient belongings bag.

## 2022-07-30 NOTE — Progress Notes (Signed)
PROGRESS NOTE   [name]  TIW:[id number] DOB: 03/18/1946 DOA: 07/28/2022 PCP: Redmond School, MD   Chief Complaint  Patient presents with   Chest Pain    sob   Level of care: ICU  Brief Admission History:   76 y.o. male with medical history significant of hypertension, hyperlipidemia, diastolic heart failure, CAD s/p stent, CKD stage IV, and obesity who presents with complaints of shortness of breath and chest pain starting 2 days ago.  Chest pain is substernal and feels like pressure. He chronically has orthopnea and has a bed that he always sleeps sitting up.  He has been feeling short of breath even when he is not doing anything which is not normal for him.  Denies having any fever, nausea, vomiting, diarrhea, abdominal pain, or diaphoresis.   Patient reports that he is followed by Dr. Harl Bowie of cardiology and at the end of June had been recommended to increase his Imdur to 60 mg twice daily.  He states that this initially helped decrease symptoms of chest pain.  Then he was told to start taking furosemide 40 mg twice daily by his nephrologist Dr. Theador Hawthorne on 6/12 and was started on torsemide 100 mg daily.  Since doing that patient reports that he has been swelling more.  He developed a blister on his left leg due to the swelling and he has been putting ointment on it.  Denies having any redness or erythema from the wound.   Upon admission into the emergency patient was seen to be afebrile with blood pressures 140/82 to 158/73 and all other vital signs maintained.  Labs significant for WBC 11.6, hemoglobin 8.2, potassium 3.2, BUN 74, creatinine 3.38, glucose 170, BNP 651, and high-sensitivity troponin 42. Chest x-ray showed cardiomegaly with vascular congestion.  Patient has been given Lasix 40 mg IV, potassium chloride 40 mEq, and magnesium oxide 800 mg p.o.  07/29/2022: Pt decompensated on med surg unit and rapid response called and patient placed on bipap and transferred to stepdown ICU.  Cardiology and nephrology consulted and patient started on maximal dose IV lasix by nephrology.  Pt noted to be in atrial fibrillation.  Family was called by MD and updated with change in status and treatment plan.    07/30/2022: Pt was intubated on 8/7 after failed bipap trial.  Afib converted to NSR.  He has diuresed >3L on high dose lasix therapy.     Assessment and Plan:  Acute respiratory failure with hypoxia  - secondary to acute heart failure exacerbation and atrial fibrillation - Trial of bipap failed and patient intubated by PCCM Dr. Halford Chessman.  - IV lasix maximal doses ordered per nephrology with good diuresis noted - cardiology consultation  - nephrology consultation - PCCM consultation  Acute HFrEF - pt is volume overloaded from poor renal function and possibly change to diuretic - he is massively volume overloaded - high dose IV lasix ordered by nephrology, follow intake and output and weights closely - limited echo 8/23: LVEF 35-40%, LV global hypokinesis - weight trending down with diuresis  - follow I/O (now neg 3.5L)  Hypertensive urgency - resume home meds and IV hydralazine ordered - IV lasix high dose for diuresis should also help with blood pressures - soft BPs noted on precedex, agree with nephrology, changed BP meds to PRN to avoid prolonged low BP  Chest pain / longstanding angina / CAD - appreciate cardiology consult - he is being treated medically with ASA 81 mg, coreg, pravastatin - not  received cath due to CKD  - following closely   Leukocytosis  - stress reaction likely  - follow, no s/s of infection found at this time   Hypokalemia  - repleted in ED, follow and check Mg in AM   Essential hypertension  - resumed home carvedilol, clonidine, isosorbide, hydralazine  Stage IV CKD - appreciate nephrology consultation  - follow daily BMP   Diabetes mellitus, type 2, controlled with renal complications - continue close CBG monitoring and SSI coverage   CBG (last 3)  Recent Labs    07/30/22 0029 07/30/22 0519 07/30/22 0726  GLUCAP 132* 126* 132*   Paroxysmal Atrial Fibrillation  - new diagnosis this admission - see cardiology team recommendations - carvedilol for rate control  - CHA2DS2-VASc =6 they are considering adding apixaban   Anemia of chronic renal disease - low threshold to transfuse for Hg<8 as it contributes to chest pain and respiratory distress   GERD  - protonix for GI protection   Hyperlipidemia - resume home pravastatin   Gout  - continue allopurinolol  - watch very closely in setting of aggressive diuresis and CKD   Obesity  -  weight coming down with aggressive diuresis Filed Weights   07/28/22 2326 07/29/22 0852 07/30/22 0500  Weight: 126.4 kg 122.8 kg 119.7 kg    Left leg wound - continue local wound care - working on diuresing aggressively as above   DVT prophylaxis: sq heparin  Code Status: Full  Family Communication: t/c to nephew 8/7, t/c brother [name] 8/8  Disposition: Status is: Inpatient Remains inpatient appropriate because: IV lasix diuresis, bipap therapy, intensity    Consultants:  Cardiology Nephrology  PCCM Procedures:  Bipap 8/7 Intubation 8/7 Antimicrobials:    Subjective: Pt sedated, intubated in ICU    Objective: Vitals:   07/30/22 0800 07/30/22 0815 07/30/22 0830 07/30/22 0910  BP: (!) 128/52 (!) 119/55 (!) 128/56 (!) 129/48  Pulse:      Resp: 18 18 17 18   Temp:      TempSrc:      SpO2:      Weight:      Height:        Intake/Output Summary (Last 24 hours) at 07/30/2022 0925 Last data filed at 07/30/2022 0911 Gross per 24 hour  Intake 380.71 ml  Output 4275 ml  Net -3894.29 ml   Filed Weights   07/28/22 2326 07/29/22 0852 07/30/22 0500  Weight: 126.4 kg 122.8 kg 119.7 kg   Examination:  General exam: Pt sedated on vent.  Respiratory system: diffuse crackles and rales.  Cardiovascular system: normal s1, s2 trace pretibial and 2+ pedal  edema. Gastrointestinal system: Abdomen is distended, soft and nontender. No organomegaly or masses felt. Normal bowel sounds heard. Central nervous system: Alert and oriented. No focal neurological deficits. Extremities: trace pretibial and 2+ pedal edema BLEs.  Skin: No rashes, lesions or ulcers. Psychiatry: sedated on vent.   Data Reviewed: I have personally reviewed following labs and imaging studies  CBC: Recent Labs  Lab 07/28/22 2328 07/30/22 0331  WBC 11.6* 10.4  NEUTROABS 9.0*  --   HGB 8.2* 8.0*  HCT 25.0* 24.8*  MCV 97.3 98.8  PLT 227 979    Basic Metabolic Panel: Recent Labs  Lab 07/28/22 2328 07/29/22 0213 07/29/22 2049 07/30/22 0331  NA 137  --  141 143  K 3.2*  --  3.6  3.6 3.7  CL 103  --  105 104  CO2 23  --  26  27  GLUCOSE 170*  --  148* 145*  BUN 74*  --  79* 79*  CREATININE 3.38*  --  3.34* 3.28*  CALCIUM 8.9  --  8.5* 8.9  MG  --  1.9 2.2 2.1    CBG: Recent Labs  Lab 07/29/22 1601 07/29/22 2143 07/30/22 0029 07/30/22 0519 07/30/22 0726  GLUCAP 162* 137* 132* 126* 132*    Recent Results (from the past 240 hour(s))  MRSA Next Gen by PCR, Nasal     Status: None   Collection Time: 07/29/22  9:00 AM   Specimen: Nasal Mucosa; Nasal Swab  Result Value Ref Range Status   MRSA by PCR Next Gen NOT DETECTED NOT DETECTED Final    Comment: (NOTE) The GeneXpert MRSA Assay (FDA approved for NASAL specimens only), is one component of a comprehensive MRSA colonization surveillance program. It is not intended to diagnose MRSA infection nor to guide or monitor treatment for MRSA infections. Test performance is not FDA approved in patients less than 78 years old. Performed at [facility], [address], [place], [place] [address]      Radiology Studies: [facility] Chest [place] 1 View  Addendum Date: 07/30/2022   ADDENDUM REPORT: 07/30/2022 08:00 ADDENDUM: Additional image done at 3:30 p.m. on 07/29/2022 is submitted for review. Tip of NG tube is seen in  the fundus of the stomach. Side port an NG tube is noted close to the gastroesophageal junction. Electronically Signed   By: Elmer Picker M.D.   On: 07/30/2022 08:00   Result Date: 07/30/2022 CLINICAL DATA:  Placement of endotracheal tube and orogastric tube EXAM: PORTABLE CHEST 1 VIEW COMPARISON:  Previous studies including the examination of 07/28/2022 FINDINGS: Transverse diameter heart is increased. There is interval worsening of pulmonary vascular congestion. Interval appearance of extensive alveolar densities are seen in the parahilar regions and lower lung fields, more so on the left side. Lateral CP angles are indistinct. There is no pneumothorax. Tip of endotracheal tube is 6.6 cm above the carina. NG tube is not visualized in chest. IMPRESSION: Cardiomegaly. There is interval worsening of pulmonary vascular congestion. Extensive alveolar infiltrates are seen in both lungs suggesting pulmonary edema or multifocal pneumonia with interval worsening. NG tube is not visualized and may be coiled within the mouth or oropharynx. Electronically Signed: By: Elmer Picker M.D. On: 07/29/2022 15:46   DG Chest Port 1 View  Result Date: 07/30/2022 CLINICAL DATA:  Chest pain and dyspnea. EXAM: PORTABLE CHEST 1 VIEW COMPARISON:  Portable chest 07/29/2022 FINDINGS: 4:55 a.m. ETT tip is 6.2 cm from the carina. Interval insertion NGT with the tip in the body of stomach. There is spinal stimulator wiring in the distal thoracic spinal canal. The heart is enlarged. The aorta is tortuous and ectatic with stable mediastinum. Aortic atherosclerosis. There is persistent perihilar vascular congestion and extensive bilateral perihilar infiltrates, more confluent on the left, with bilateral peripheral sparing and small pleural effusions. No new or worsening opacities seen. Overall aeration is generally stable. IMPRESSION: Persistent left-greater-than-right perihilar infiltrates with cardiomegaly and central vascular  engorgement. Findings could be due to alveolar pulmonary edema, pneumonia or combination. Stable overall aeration noted today with no new or worsening infiltrates. Small pleural effusions appear similar. NGT tip in the body of stomach with stable ETT positioning. Aortic atherosclerosis. Electronically Signed   By: Telford Nab M.D.   On: 07/30/2022 07:21   ECHOCARDIOGRAM LIMITED  Result Date: 07/29/2022    ECHOCARDIOGRAM LIMITED REPORT   Patient Name:  [name] Date of Exam: 07/29/2022 Medical Rec #:  [id number]       Height:       74.0 in Accession #:    [id number]      Weight:       270.7 lb Date of Birth:  [birth year]       BSA:          2.472 m Patient Age:    40 years        BP:           168/79 mmHg Patient Gender: M               HR:           80 bpm. Exam Location:  [facility] Procedure: Limited Echo and Intracardiac Opacification Agent Indications:    CHF  History:        Patient has prior history of Echocardiogram examinations, most                 recent 02/07/2022. CHF, CAD and Previous Myocardial Infarction,                 Arrythmias:PVC, Signs/Symptoms:Chest Pain; Risk                 Factors:Hypertension, Diabetes and Dyslipidemia. Patient on                 Bi-Pap.  Sonographer:    Wenda Low Referring Phys: [phone] Nadean Corwin HILTY  Sonographer Comments: Patient is morbidly obese. IMPRESSIONS  1. Limited echo for LVEF and regional wall motion.  2. No apical thrombus by Definity contrast. Left ventricular ejection fraction, by estimation, is 35 to 40%. Left ventricular ejection fraction by 2D MOD biplane is 36.6 %. The left ventricle has moderately decreased function. The left ventricle demonstrates severe global hypokinesis, worse at the apex. Regional wall motion is difficult to ascertain given frequent PVC's.  3. The inferior vena cava is dilated in size with <50% respiratory variability, suggesting right atrial pressure of 15 mmHg.  4. Right ventricular systolic function was not well  visualized. The right ventricular size is not well visualized.  5. Rhythm strip during this exam demonstrates atrial fibrillation. Comparison(s): Changes from prior study are noted. 02/07/2022: LVEF 60-65%, no regional WMA's. FINDINGS  Left Ventricle: No apical thrombus by Definity contrast. Left ventricular ejection fraction, by estimation, is 35 to 40%. Left ventricular ejection fraction by 2D MOD biplane is 36.6 %. The left ventricle has moderately decreased function. The left ventricle demonstrates global hypokinesis. Definity contrast agent was given IV to delineate the left ventricular endocardial borders. The left ventricular internal cavity size was normal in size. There is moderate left ventricular hypertrophy. Right Ventricle: The right ventricular size is not well visualized. Right vetricular wall thickness was not well visualized. Right ventricular systolic function was not well visualized. Aorta: The aortic root and ascending aorta are structurally normal, with no evidence of dilitation. Venous: The inferior vena cava is dilated in size with less than 50% respiratory variability, suggesting right atrial pressure of 15 mmHg. EKG: Rhythm strip during this exam demonstrates atrial fibrillation. LEFT VENTRICLE PLAX 2D                        Biplane EF (MOD) LVIDd:         6.80 cm         LV Biplane EF:   Left LVIDs:  4.60 cm                          ventricular LV PW:         1.40 cm                          ejection LV IVS:        1.50 cm                          fraction by LVOT diam:     2.00 cm                          2D MOD LVOT Area:     3.14 cm                         biplane is                                                 36.6 %.  LV Volumes (MOD) LV vol d, MOD    122.0 ml A2C: LV vol d, MOD    128.0 ml A4C: LV vol s, MOD    74.4 ml A2C: LV vol s, MOD    89.6 ml A4C: LV SV MOD A2C:   47.6 ml LV SV MOD A4C:   128.0 ml LV SV MOD BP:    49.2 ml LEFT ATRIUM         Index LA diam:    5.50 cm  2.22 cm/m   AORTA Ao Root diam: 3.30 cm  SHUNTS Systemic Diam: 2.00 cm Lyman Bishop MD Electronically signed by Lyman Bishop MD Signature Date/Time: 07/29/2022/4:28:09 PM    Final    DG Chest Portable 1 View  Result Date: 07/28/2022 CLINICAL DATA:  Shortness of breath, chest pain EXAM: PORTABLE CHEST 1 VIEW COMPARISON:  05/29/2022 FINDINGS: Cardiomegaly, vascular congestion. No confluent opacities, effusions or edema. No acute bony abnormality. IMPRESSION: Cardiomegaly, vascular congestion. Electronically Signed   By: Rolm Baptise M.D.   On: 07/28/2022 23:46    Scheduled Meds:  allopurinol  100 mg Per Tube Daily   aspirin  81 mg Per Tube Daily   carvedilol  6.25 mg Per Tube BID WC   Chlorhexidine Gluconate Cloth  6 each Topical Q0600   cloNIDine  0.1 mg Per Tube BID   darbepoetin (ARANESP) injection - NON-DIALYSIS  200 mcg Subcutaneous Q Tue-1800   docusate  100 mg Per Tube BID   heparin  5,000 Units Subcutaneous Q8H   hydrALAZINE  50 mg Per Tube TID   insulin aspart  0-9 Units Subcutaneous Q4H   isosorbide dinitrate  40 mg Per Tube TID   mouth rinse  15 mL Mouth Rinse Q2H   pantoprazole sodium  40 mg Per Tube Daily   polyethylene glycol  17 g Per Tube Daily   pravastatin  40 mg Per Tube QPM   sodium chloride flush  3 mL Intravenous Q12H   Continuous Infusions:  sodium chloride     dexmedetomidine (PRECEDEX) IV infusion Stopped (07/29/22 2143)   fentaNYL infusion INTRAVENOUS 25 mcg/hr (07/29/22 2154)   ferric gluconate (FERRLECIT) IVPB 250 mg (07/30/22 0905)  furosemide 160 mg (07/30/22 0745)   potassium chloride       LOS: 1 day   Critical Care Procedure Note Authorized and Performed by: Murvin Natal MD  Total Critical Care time:  54 mins Due to a high probability of clinically significant, life threatening deterioration, the patient required my highest level of preparedness to intervene emergently and I personally spent this critical care time directly and personally managing  the patient.  This critical care time included obtaining a history; examining the patient, pulse oximetry; ordering and review of studies; arranging urgent treatment with development of a management plan; evaluation of patient's response of treatment; frequent reassessment; and discussions with other providers.  This critical care time was performed to assess and manage the high probability of imminent and life threatening deterioration that could result in multi-organ failure.  It was exclusive of separately billable procedures and treating other patients and teaching time.    Irwin Brakeman, MD How to contact the [facility] Attending or Consulting provider [place] or covering provider during after hours [place], for this patient?  Check the care team in [facility] and look for a) attending/consulting TR[facility] provider listed and b) the [facility] team listed Log into [email or web address] and use [place]'s universal password to access. If you do not have the password, please contact the hospital operator. Locate the [facility] provider you are looking for under [facility] and page to a number that you can be directly reached. If you still have difficulty reaching the provider, please page the [facility] (Director on Call) for the Hospitalists listed on amion for assistance.  07/30/2022, 9:25 AM

## 2022-07-30 NOTE — Progress Notes (Addendum)
Initial Nutrition Assessment  DOCUMENTATION CODES:   Obesity unspecified  INTERVENTION:  - Vital HP @ 40 ml/hr via OGT and increase by 10 ml every 6 hours to goal rate of 70 ml/hr.   -Tube feeding regimen provides: 1680 kcal, 147 gr protein and 1404 ml fluid.   NUTRITION DIAGNOSIS:   Inadequate oral intake related to inability to eat as evidenced by NPO status.   GOAL:  Provide needs based on ASPEN/SCCM guidelines  MONITOR:  Vent status, Labs, I & O's, Weight trends, TF tolerance  REASON FOR ASSESSMENT:   Ventilator    ASSESSMENT: Patient is a 76 yo male with hx of diastolic heart failure, HTN, CAD, CKD-3, GERD and DM2.   Acute heart failure exacerbation and atrial fibrillation, acute respiratory failure with hypoxia. Failed bipap and is currently intubated on ventilator support. OGT- 16 french. Placement verified by x-ray.  MV: 14.4  L/min Temp (24hrs), Avg:98.9 F (37.2 C), Min:98.4 F (36.9 C), Max:99.8 F (37.7 C)  Fentanyl: 17.5  ml/hr   Medications: colace, allopurinol, insulin, protonix and miralax, lasix and ferric gluconate.  Intake/Output Summary (Last 24 hours) at 07/30/2022 1328 Last data filed at 07/30/2022 1224 Gross per 24 hour  Intake 877.03 ml  Output 4450 ml  Net -3572.97 ml        Latest Ref Rng & Units 07/30/2022    3:31 AM 07/29/2022    8:49 PM 07/28/2022   11:28 PM  BMP  Glucose 70 - 99 mg/dL 145  148  170   BUN 8 - 23 mg/dL 79  79  74   Creatinine 0.61 - 1.24 mg/dL 3.28  3.34  3.38   Sodium 135 - 145 mmol/L 143  141  137   Potassium 3.5 - 5.1 mmol/L 3.7  3.6    3.6  3.2   Chloride 98 - 111 mmol/L 104  105  103   CO2 22 - 32 mmol/L 27  26  23    Calcium 8.9 - 10.3 mg/dL 8.9  8.5  8.9      NUTRITION - FOCUSED PHYSICAL EXAM: deferred   Diet Order:   Diet Order             Diet NPO time specified  Diet effective now                   EDUCATION NEEDS:  Not appropriate for education at this time  Skin:  Skin Assessment: Skin  Integrity Issues: Skin Integrity Issues::  (7/20- venous statis ulcer)  Last BM:  8/8 type 6  Height:   Ht Readings from Last 1 Encounters:  07/29/22 6' 2"  (1.88 m)    Weight:   Wt Readings from Last 1 Encounters:  07/30/22 119.7 kg    Ideal Body Weight:   77 kg  BMI:  Body mass index is 33.88 kg/m.  Estimated Nutritional Needs:   Kcal:  2330  Protein:  145-154 gr  Fluid:  1500 ml daily  Colman Cater MS,RD,CSG,LDN Contact: Shea Evans

## 2022-07-30 NOTE — Progress Notes (Signed)
Progress Note  Patient Name: [name] Date of Encounter: 07/30/2022  [facility] Cardiologist: Carlyle Dolly, MD   Subjective   Currently intubated and sedated. Had some hypotension yesterday and Amlodipine, Hydralazine and Clonidine have already been reduced by the admitting team and vent settings were adjusted.   Inpatient Medications    Scheduled Meds:  allopurinol  100 mg Per Tube Daily   amLODipine  10 mg Per Tube QPM   aspirin EC  81 mg Oral Daily   carvedilol  6.25 mg Per Tube BID WC   Chlorhexidine Gluconate Cloth  6 each Topical Q0600   cloNIDine  0.1 mg Per Tube TID   docusate  100 mg Per Tube BID   heparin  5,000 Units Subcutaneous Q8H   hydrALAZINE  100 mg Per Tube TID   insulin aspart  0-9 Units Subcutaneous Q4H   isosorbide dinitrate  40 mg Per Tube TID   mouth rinse  15 mL Mouth Rinse Q2H   pantoprazole sodium  40 mg Per Tube Daily   polyethylene glycol  17 g Per Tube Daily   pravastatin  40 mg Per Tube QPM   sodium chloride flush  3 mL Intravenous Q12H   Continuous Infusions:  sodium chloride     dexmedetomidine (PRECEDEX) IV infusion Stopped (07/29/22 2143)   fentaNYL infusion INTRAVENOUS 25 mcg/hr (07/29/22 2154)   furosemide 160 mg (07/30/22 0242)   PRN Meds: sodium chloride, acetaminophen, fentaNYL, fentaNYL (SUBLIMAZE) injection, midazolam, ondansetron (ZOFRAN) IV, mouth rinse, sodium chloride flush   Vital Signs    Vitals:   07/30/22 0630 07/30/22 0632 07/30/22 0724 07/30/22 0735  BP: (!) 136/51   (!) 144/58  Pulse:    71  Resp: 19 18 18    Temp:   98.4 F (36.9 C)   TempSrc:   Axillary   SpO2:    100%  Weight:      Height:        Intake/Output Summary (Last 24 hours) at 07/30/2022 0741 Last data filed at 07/30/2022 3559 Gross per 24 hour  Intake 180.71 ml  Output 3800 ml  Net -3619.29 ml      07/30/2022    5:00 AM 07/29/2022    8:52 AM 07/28/2022   11:26 PM  Last 3 Weights  Weight (lbs) 263 lb 14.3 oz 270 lb 11.6 oz 278 lb  9.6 oz  Weight (kg) 119.7 kg 122.8 kg 126.372 kg      Telemetry    NSR, HR in 50's to 70's. Frequent PVC's and episodes of ventricular bigeminy.  - Personally Reviewed  ECG    Sinus bradycardia, HR 59 with PVC's.  - Personally Reviewed  Physical Exam   GEN: No acute distress. Currently intubated and sedated.  Neck: JVD elevated to jaw line.  Cardiac: RRR with occasional ectopic beats, no murmurs, rubs, or gallops.  Respiratory: Decreased breath sounds along bases bilaterally. GI: Soft, nontender, non-distended  MS: 2+ pitting edema bilaterally; No deformity. Neuro:  Nonfocal  Psych: Intubated and sedated.   Labs    High Sensitivity Troponin:   Recent Labs  Lab 07/28/22 2328 07/29/22 0213  TROPONINIHS 42* 41*     Chemistry Recent Labs  Lab 07/28/22 2328 07/29/22 0213 07/29/22 2049 07/30/22 0331  NA 137  --  141 143  K 3.2*  --  3.6  3.6 3.7  CL 103  --  105 104  CO2 23  --  26 27  GLUCOSE 170*  --  148*  145*  BUN 74*  --  79* 79*  CREATININE 3.38*  --  3.34* 3.28*  CALCIUM 8.9  --  8.5* 8.9  MG  --  1.9 2.2 2.1  PROT 7.6  --   --   --   ALBUMIN 4.2  --   --   --   AST 41  --   --   --   ALT 39  --   --   --   ALKPHOS 59  --   --   --   BILITOT 0.4  --   --   --   GFRNONAA 18*  --  18* 19*  ANIONGAP 11  --  10 12    Lipids No results for input(s): "CHOL", "TRIG", "HDL", "LABVLDL", "LDLCALC", "CHOLHDL" in the last 168 hours.  Hematology Recent Labs  Lab 07/28/22 2328 07/30/22 0331  WBC 11.6* 10.4  RBC 2.57* 2.51*  HGB 8.2* 8.0*  HCT 25.0* 24.8*  MCV 97.3 98.8  MCH 31.9 31.9  MCHC 32.8 32.3  RDW 16.4* 16.5*  PLT 227 186   Thyroid No results for input(s): "TSH", "FREET4" in the last 168 hours.  BNP Recent Labs  Lab 07/28/22 2338  BNP 651.0*    DDimer No results for input(s): "DDIMER" in the last 168 hours.   Radiology    DG Chest Port 1 View  Result Date: 07/30/2022 CLINICAL DATA:  Chest pain and dyspnea. EXAM: PORTABLE CHEST 1 VIEW  COMPARISON:  Portable chest 07/29/2022 FINDINGS: 4:55 a.m. ETT tip is 6.2 cm from the carina. Interval insertion NGT with the tip in the body of stomach. There is spinal stimulator wiring in the distal thoracic spinal canal. The heart is enlarged. The aorta is tortuous and ectatic with stable mediastinum. Aortic atherosclerosis. There is persistent perihilar vascular congestion and extensive bilateral perihilar infiltrates, more confluent on the left, with bilateral peripheral sparing and small pleural effusions. No new or worsening opacities seen. Overall aeration is generally stable. IMPRESSION: Persistent left-greater-than-right perihilar infiltrates with cardiomegaly and central vascular engorgement. Findings could be due to alveolar pulmonary edema, pneumonia or combination. Stable overall aeration noted today with no new or worsening infiltrates. Small pleural effusions appear similar. NGT tip in the body of stomach with stable ETT positioning. Aortic atherosclerosis. Electronically Signed   By: Telford Nab M.D.   On: 07/30/2022 07:21  DG Chest Port 1 View  Result Date: 07/29/2022 CLINICAL DATA:  Placement of endotracheal tube and orogastric tube EXAM: PORTABLE CHEST 1 VIEW COMPARISON:  Previous studies including the examination of 07/28/2022 FINDINGS: Transverse diameter heart is increased. There is interval worsening of pulmonary vascular congestion. Interval appearance of extensive alveolar densities are seen in the parahilar regions and lower lung fields, more so on the left side. Lateral CP angles are indistinct. There is no pneumothorax. Tip of endotracheal tube is 6.6 cm above the carina. NG tube is not visualized in chest. IMPRESSION: Cardiomegaly. There is interval worsening of pulmonary vascular congestion. Extensive alveolar infiltrates are seen in both lungs suggesting pulmonary edema or multifocal pneumonia with interval worsening. NG tube is not visualized and may be coiled within the mouth  or oropharynx. Electronically Signed   By: Elmer Picker M.D.   On: 07/29/2022 15:46   DG Chest Portable 1 View  Result Date: 07/28/2022 CLINICAL DATA:  Shortness of breath, chest pain EXAM: PORTABLE CHEST 1 VIEW COMPARISON:  05/29/2022 FINDINGS: Cardiomegaly, vascular congestion. No confluent opacities, effusions or edema. No acute bony abnormality. IMPRESSION:  Cardiomegaly, vascular congestion. Electronically Signed   By: Rolm Baptise M.D.   On: 07/28/2022 23:46    Cardiac Studies   NST: 04/2022   Findings are consistent with prior inferior myocardial infarction. There is a small apical infarct with mild peri-infarct ischemia.  The study is low risk.   No ST deviation was noted.   LV perfusion is abnormal.   Left ventricular function is normal. Nuclear stress EF: 55 %. The left ventricular ejection fraction is normal (55-65%). End diastolic cavity size is severely enlarged.  Limited Echo: 07/2022 IMPRESSIONS     1. Limited echo for LVEF and regional wall motion.   2. No apical thrombus by Definity contrast. Left ventricular ejection  fraction, by estimation, is 35 to 40%. Left ventricular ejection fraction  by 2D MOD biplane is 36.6 %. The left ventricle has moderately decreased  function. The left ventricle  demonstrates severe global hypokinesis, worse at the apex. Regional wall  motion is difficult to ascertain given frequent PVC's.   3. The inferior vena cava is dilated in size with <50% respiratory  variability, suggesting right atrial pressure of 15 mmHg.   4. Right ventricular systolic function was not well visualized. The right  ventricular size is not well visualized.   5. Rhythm strip during this exam demonstrates atrial fibrillation.   Comparison(s): Changes from prior study are noted. 02/07/2022: LVEF 60-65%,  no regional WMA's.   Patient Profile     76 y.o. male w/ PMH of CAD (s/p DES to LAD and D1 in 10/2012 with nonobstructive disease along RCA and LCx,  low-risk NST in 04/2022), HFpEF, HTN, HLD and Stage 4 CKD who is currently admitted for an acute CHF exacerbation.   Assessment & Plan    1. HFrEF - Presented with a CHF exacerbation and BNP elevated to 651. Repeat echo yesterday showed a reduced EF of 35-40% with severe global hypokinesis, worse at the apex which is new when compared to prior tracings. Unclear if rate-related or due to worsening CAD. He failed BiPAP yesterday and required intubation.  - Diuresis has been deferred to Nephrology and he is currently receiving IV Lasix 160 mg Q6H and Albumin with a recorded net output of -5.1 L thus far. Weight listed as being down from 278 lbs to 263 lbs and was previously at 272 lbs in 01/2022 so will need to establish a new dry weight as he still appears volume overloaded on examination.  - Remains on Coreg, Hydralazine and Isordil. Would not further titrate Coreg given HR in the 50's but can potentially stop Amlodipine if this hinders titration of medical therapy for his cardiomyopathy. Remains on Hydralazine 59m TID and Isordil 4105mTID. Not a candidate for ACE-I/ARB/ARNI/Spiro/SGLT2 inhibitor given his renal function.   2. CAD/Chest Pain - He is s/p DES to LAD and D1 in 10/2012 with nonobstructive disease along RCA and LCx. He did have a low-risk NST in 04/2022. - Hs Troponin values have been flat at 42 and 41 this admission but repeat echo does show a cardiomyopathy.  - Currently not a candidate for catheterization given his CKD and high-risk for contrast-induced nephropathy.  - Remains on ASA 8133maily, Coreg 6.91m88mD and Pravastatin 40mg48mly.   3. Paroxysmal Atrial Fibrillation - New diagnosis this admission but appears he has converted to NSR by telemetry and repeat EKG. Remains on Coreg 6.91mg 46mand would not further titrate given HR in the 50's at times.  - This patients CHA2DS2-VASc Score  and unadjusted Ischemic Stroke Rate (% per year) is equal to 6. Will need to consider adding  Eliquis for anticoagulation. He does have a chronic anemia (Hgb at 8.0 today) which will need to be followed closely.  4. HTN - Had hypotension yesterday evening but now improved, at 144/58 on most recent check. Dosing of Amlodipine, Clonidine and Hydralazine have already been reduced by the admitting team. Currently scheduled to receive Amlodipine 54m daily, Coreg 6.225mBID, Clonidine 0.40m140mID, Hydralazine 77m440mD and Isordil 40mg74m.   5. HLD - FLP in 04/2022 showed total cholesterol of 99, triglycerides 121, HDL 29 and LDL 46. Remains on Pravastatin 40mg 40my.   6. Stage 4 CKD - Baseline creatinine 2.8 -3.0 but peaked at 4.12 in 04/2022. At 3.38 on admission and improved to 3.28 today. Nephrology following.   For questions or updates, please contact [facility] consult [email or web address] for contact info under        Signed, BrittaErma Heritage  07/30/2022, 7:41 AM

## 2022-07-30 NOTE — Progress Notes (Signed)
Subjective:  ended up getting intubated yesterday afternoon-  great UOP overnight-  negative 3.6 liters -  BP highly variable from 315 to 85 systolic -  echo shows new LV dysfunction   Objective Vital signs in last 24 hours: Vitals:   07/30/22 0630 07/30/22 0632 07/30/22 0724 07/30/22 0735  BP: (!) 136/51   (!) 144/58  Pulse:    71  Resp: 19 18 18    Temp:   98.4 F (36.9 C)   TempSrc:   Axillary   SpO2:    100%  Weight:      Height:       Weight change: 2.597 kg  Intake/Output Summary (Last 24 hours) at 07/30/2022 1761 Last data filed at 07/30/2022 6073 Gross per 24 hour  Intake 180.71 ml  Output 3800 ml  Net -3619.29 ml    Assessment/Plan: 76 year old BM with DM, HTN, diastolic heart failure,  CAD and CKD-  presenting with SOB and CP 1.Renal- advanced CKD at baseline-  will complicate things-  appears to be volume overloaded leading to SOB.  Given his low GFR needing very high dose diuretics it seems to bring clinical improvement-  so far is diuresing on lasix 160 q 6 and metolazone-  will redose metolazone today.   There are no acute indications for dialysis.  Cardiology note says pt has refused dialysis -  I dont see other documentation of this -  I did not ask pt when he was in distress-  according to Dr. [name] he consented to HD if needed but not needed today   2. Hypertension/volume  - hypertensive and volume overloaded-  increase diuretics to max and follow.  Given that he has had transient hypotension will stop the norvasc-  would rather give meds PRN than have him get too low and stay there-  with improving volume status his BP will be lower as well 3. Anemia  - is not helping his SOB-  been getting iron and ESA as OP-  current hgb 8.0-   will dose with iron and ESA but have low threshold for transfusion  4. Hypokalemia-  on replacement- likely will go donw further if diuresis is successful  5. CV-  has new LV dysfunction-  not sure if intervention is being considered-   per  cards      Louis Meckel    Labs: Basic Metabolic Panel: Recent Labs  Lab 07/28/22 2328 07/29/22 2049 07/30/22 0331  NA 137 141 143  K 3.2* 3.6  3.6 3.7  CL 103 105 104  CO2 23 26 27   GLUCOSE 170* 148* 145*  BUN 74* 79* 79*  CREATININE 3.38* 3.34* 3.28*  CALCIUM 8.9 8.5* 8.9   Liver Function Tests: Recent Labs  Lab 07/28/22 2328  AST 41  ALT 39  ALKPHOS 59  BILITOT 0.4  PROT 7.6  ALBUMIN 4.2   No results for input(s): "LIPASE", "AMYLASE" in the last 168 hours. No results for input(s): "AMMONIA" in the last 168 hours. CBC: Recent Labs  Lab 07/28/22 2328 07/30/22 0331  WBC 11.6* 10.4  NEUTROABS 9.0*  --   HGB 8.2* 8.0*  HCT 25.0* 24.8*  MCV 97.3 98.8  PLT 227 186   Cardiac Enzymes: No results for input(s): "CKTOTAL", "CKMB", "CKMBINDEX", "TROPONINI" in the last 168 hours. CBG: Recent Labs  Lab 07/29/22 1601 07/29/22 2143 07/30/22 0029 07/30/22 0519 07/30/22 0726  GLUCAP 162* 137* 132* 126* 132*    Iron Studies: No results for input(s): "  IRON", "TIBC", "TRANSFERRIN", "FERRITIN" in the last 72 hours. Studies/Results: DG Chest Port 1 View  Addendum Date: 07/30/2022   ADDENDUM REPORT: 07/30/2022 08:00 ADDENDUM: Additional image done at 3:30 p.m. on 07/29/2022 is submitted for review. Tip of NG tube is seen in the fundus of the stomach. Side port an NG tube is noted close to the gastroesophageal junction. Electronically Signed   By: Elmer Picker M.D.   On: 07/30/2022 08:00   Result Date: 07/30/2022 CLINICAL DATA:  Placement of endotracheal tube and orogastric tube EXAM: PORTABLE CHEST 1 VIEW COMPARISON:  Previous studies including the examination of 07/28/2022 FINDINGS: Transverse diameter heart is increased. There is interval worsening of pulmonary vascular congestion. Interval appearance of extensive alveolar densities are seen in the parahilar regions and lower lung fields, more so on the left side. Lateral CP angles are indistinct. There  is no pneumothorax. Tip of endotracheal tube is 6.6 cm above the carina. NG tube is not visualized in chest. IMPRESSION: Cardiomegaly. There is interval worsening of pulmonary vascular congestion. Extensive alveolar infiltrates are seen in both lungs suggesting pulmonary edema or multifocal pneumonia with interval worsening. NG tube is not visualized and may be coiled within the mouth or oropharynx. Electronically Signed: By: Elmer Picker M.D. On: 07/29/2022 15:46   DG Chest Port 1 View  Result Date: 07/30/2022 CLINICAL DATA:  Chest pain and dyspnea. EXAM: PORTABLE CHEST 1 VIEW COMPARISON:  Portable chest 07/29/2022 FINDINGS: 4:55 a.m. ETT tip is 6.2 cm from the carina. Interval insertion NGT with the tip in the body of stomach. There is spinal stimulator wiring in the distal thoracic spinal canal. The heart is enlarged. The aorta is tortuous and ectatic with stable mediastinum. Aortic atherosclerosis. There is persistent perihilar vascular congestion and extensive bilateral perihilar infiltrates, more confluent on the left, with bilateral peripheral sparing and small pleural effusions. No new or worsening opacities seen. Overall aeration is generally stable. IMPRESSION: Persistent left-greater-than-right perihilar infiltrates with cardiomegaly and central vascular engorgement. Findings could be due to alveolar pulmonary edema, pneumonia or combination. Stable overall aeration noted today with no new or worsening infiltrates. Small pleural effusions appear similar. NGT tip in the body of stomach with stable ETT positioning. Aortic atherosclerosis. Electronically Signed   By: Telford Nab M.D.   On: 07/30/2022 07:21   ECHOCARDIOGRAM LIMITED  Result Date: 07/29/2022    ECHOCARDIOGRAM LIMITED REPORT   Patient Name:   [name] Date of Exam: 07/29/2022 Medical Rec #:  [id number]       Height:       74.0 in Accession #:    [id number]      Weight:       270.7 lb Date of Birth:  03-05-1946       BSA:           2.472 m Patient Age:    76 years        BP:           168/79 mmHg Patient Gender: M               HR:           80 bpm. Exam Location:  [facility] Procedure: Limited Echo and Intracardiac Opacification Agent Indications:    CHF  History:        Patient has prior history of Echocardiogram examinations, most                 recent 02/07/2022. CHF, CAD and Previous Myocardial  Infarction,                 Arrythmias:PVC, Signs/Symptoms:Chest Pain; Risk                 Factors:Hypertension, Diabetes and Dyslipidemia. Patient on                 Bi-Pap.  Sonographer:    Wenda Low Referring Phys: [phone] Nadean Corwin HILTY  Sonographer Comments: Patient is morbidly obese. IMPRESSIONS  1. Limited echo for LVEF and regional wall motion.  2. No apical thrombus by Definity contrast. Left ventricular ejection fraction, by estimation, is 35 to 40%. Left ventricular ejection fraction by 2D MOD biplane is 36.6 %. The left ventricle has moderately decreased function. The left ventricle demonstrates severe global hypokinesis, worse at the apex. Regional wall motion is difficult to ascertain given frequent PVC's.  3. The inferior vena cava is dilated in size with <50% respiratory variability, suggesting right atrial pressure of 15 mmHg.  4. Right ventricular systolic function was not well visualized. The right ventricular size is not well visualized.  5. Rhythm strip during this exam demonstrates atrial fibrillation. Comparison(s): Changes from prior study are noted. 02/07/2022: LVEF 60-65%, no regional WMA's. FINDINGS  Left Ventricle: No apical thrombus by Definity contrast. Left ventricular ejection fraction, by estimation, is 35 to 40%. Left ventricular ejection fraction by 2D MOD biplane is 36.6 %. The left ventricle has moderately decreased function. The left ventricle demonstrates global hypokinesis. Definity contrast agent was given IV to delineate the left ventricular endocardial borders. The left ventricular internal cavity  size was normal in size. There is moderate left ventricular hypertrophy. Right Ventricle: The right ventricular size is not well visualized. Right vetricular wall thickness was not well visualized. Right ventricular systolic function was not well visualized. Aorta: The aortic root and ascending aorta are structurally normal, with no evidence of dilitation. Venous: The inferior vena cava is dilated in size with less than 50% respiratory variability, suggesting right atrial pressure of 15 mmHg. EKG: Rhythm strip during this exam demonstrates atrial fibrillation. LEFT VENTRICLE PLAX 2D                        Biplane EF (MOD) LVIDd:         6.80 cm         LV Biplane EF:   Left LVIDs:         4.60 cm                          ventricular LV PW:         1.40 cm                          ejection LV IVS:        1.50 cm                          fraction by LVOT diam:     2.00 cm                          2D MOD LVOT Area:     3.14 cm                         biplane is  36.6 %.  LV Volumes (MOD) LV vol d, MOD    122.0 ml A2C: LV vol d, MOD    128.0 ml A4C: LV vol s, MOD    74.4 ml A2C: LV vol s, MOD    89.6 ml A4C: LV SV MOD A2C:   47.6 ml LV SV MOD A4C:   128.0 ml LV SV MOD BP:    49.2 ml LEFT ATRIUM         Index LA diam:    5.50 cm 2.22 cm/m   AORTA Ao Root diam: 3.30 cm  SHUNTS Systemic Diam: 2.00 cm Lyman Bishop MD Electronically signed by Lyman Bishop MD Signature Date/Time: 07/29/2022/4:28:09 PM    Final    DG Chest Portable 1 View  Result Date: 07/28/2022 CLINICAL DATA:  Shortness of breath, chest pain EXAM: PORTABLE CHEST 1 VIEW COMPARISON:  05/29/2022 FINDINGS: Cardiomegaly, vascular congestion. No confluent opacities, effusions or edema. No acute bony abnormality. IMPRESSION: Cardiomegaly, vascular congestion. Electronically Signed   By: Rolm Baptise M.D.   On: 07/28/2022 23:46   Medications: Infusions:  sodium chloride     dexmedetomidine (PRECEDEX) IV  infusion Stopped (07/29/22 2143)   fentaNYL infusion INTRAVENOUS 25 mcg/hr (07/29/22 2154)   furosemide 160 mg (07/30/22 0745)    Scheduled Medications:  allopurinol  100 mg Per Tube Daily   amLODipine  5 mg Per Tube QPM   aspirin EC  81 mg Oral Daily   carvedilol  6.25 mg Per Tube BID WC   Chlorhexidine Gluconate Cloth  6 each Topical Q0600   cloNIDine  0.1 mg Per Tube BID   docusate  100 mg Per Tube BID   heparin  5,000 Units Subcutaneous Q8H   hydrALAZINE  50 mg Per Tube TID   insulin aspart  0-9 Units Subcutaneous Q4H   isosorbide dinitrate  40 mg Per Tube TID   mouth rinse  15 mL Mouth Rinse Q2H   pantoprazole sodium  40 mg Per Tube Daily   polyethylene glycol  17 g Per Tube Daily   pravastatin  40 mg Per Tube QPM   sodium chloride flush  3 mL Intravenous Q12H    have reviewed scheduled and prn medications.  Physical Exam: General: intubated-  sedated Heart: slower HR now Lungs: CBS bilat -  fi02 is coming down  Abdomen: soft, non tender Extremities: pitting edema  Dialysis Access: none     07/30/2022,8:12 AM  LOS: 1 day

## 2022-07-30 NOTE — Progress Notes (Signed)
[place]   Patient name: [name] Admit date: 07/28/2022  DOB: 04-06-1946 LOS: 1  MRN: [id number] Consult date: 07/29/2022  Referring provider: Dr. [name], [facility] CC: Short of breath    History:  76 yo male former smoker presented to [facility] ER with chest pain and dyspnea for 2 days.  He was admitted for exacerbation of heart failure with renal failure.  BP was as high as 194/97.  He was started on Bipap.  He was seen by cardiology and nephrology.  PCCM consulted to assist with respiratory management.  Past medical history:  Anxiety, BPH, CAD s/p DES, CKD 4, DJD, Depression, DM type 2, Diastolic CHF, ED, GERD, Gout, HLD, HTN, Neuropathy, Vertigo  Significant events:  8/06 Admit 8/07 cardiology/nephrology consulted, intubated  Studies:  Echo 8/07 >> EF 35 to 40%, severe global hypokinesis worse at apex  Micro:    Lines:  ETT 8/07 >>   Antibiotics:    Consults:  Cardiology Nephrology    Interim history:  Remains on full vent support and sedation.  Had trouble with low BP overnight.    Vital signs:  BP (!) 150/59   Pulse 72   Temp 98.7 F (37.1 C) (Axillary)   Resp 18   Ht 6' 2"  (1.88 m)   Wt 119.7 kg   SpO2 100%   BMI 33.88 kg/m   Intake/output:  I/O last 3 completed shifts: In: 183.7 [I.V.:120.6; IV Piggyback:63.1] Out: 5250 [Urine:5250]   Physical exam:   General - sedated Eyes - pupils reactive ENT - ETT in place Cardiac - scattered rhonchi Chest - b/l rhonchi Abdomen - soft, non tender, + bowel sounds Extremities - 2+ edema Skin - no rashes Neuro - RASS -1    Best practice:   DVT - SQ heparin SUP - protonix Nutrition - tube feeds   Assessment/plan:   Acute hypoxic respiratory failure from acute pulmonary edema. - continue full vent support for now - f/u CXR 8/09 - goal SpO2 > 92%  Hypertensive urgency. Acute on chronic diastolic CHF. New onset acute systolic CHF. Atrial  fibrillation. Hx of CAD s/p DES, HLD. - goal normotension - cardiology consulted  Stage 4 CKD. Hypokalemia. - he indicated to me on 8/07 that he would be okay with dialysis if needed - nephrology consulted - good response thus far to diuresis; continue lasix  Sedation while on vent. - precedex, fentanyl gtt with prn versed for RASS goal 0 to -1  DM type 2. Anemia of chronic disease and iron deficiency. GERD. Gout. Chronic Lt leg wound. - per primary team  Resolved hospital problems:    Goals of care/Family discussions:  Code status: full code  Labs:      Latest Ref Rng & Units 07/30/2022    3:31 AM 07/29/2022    8:49 PM 07/28/2022   11:28 PM  CMP  Glucose 70 - 99 mg/dL 145  148  170   BUN 8 - 23 mg/dL 79  79  74   Creatinine 0.61 - 1.24 mg/dL 3.28  3.34  3.38   Sodium 135 - 145 mmol/L 143  141  137   Potassium 3.5 - 5.1 mmol/L 3.7  3.6    3.6  3.2   Chloride 98 - 111 mmol/L 104  105  103   CO2 22 - 32 mmol/L 27  26  23    Calcium 8.9 - 10.3 mg/dL 8.9  8.5  8.9   Total Protein 6.5 -  8.1 g/dL   7.6   Total Bilirubin 0.3 - 1.2 mg/dL   0.4   Alkaline Phos 38 - 126 U/L   59   AST 15 - 41 U/L   41   ALT 0 - 44 U/L   39        Latest Ref Rng & Units 07/30/2022    3:31 AM 07/28/2022   11:28 PM 07/17/2022    2:04 PM  CBC  WBC 4.0 - 10.5 K/uL 10.4  11.6  6.7   Hemoglobin 13.0 - 17.0 g/dL 8.0  8.2  8.4   Hematocrit 39.0 - 52.0 % 24.8  25.0  25.9   Platelets 150 - 400 K/uL 186  227  206     ABG    Component Value Date/Time   PHART 7.47 (H) 07/30/2022 0430   PCO2ART 40 07/30/2022 0430   PO2ART 75 (L) 07/30/2022 0430   HCO3 29.1 (H) 07/30/2022 0430   O2SAT 97.8 07/30/2022 0430    CBG (last 3)  Recent Labs    07/30/22 0519 07/30/22 0726 07/30/22 1132  GLUCAP 126* 132* 117*    Critical care time: 33 minutes  Chesley Mires, MD [place] Pager - [phone] - [phone] 07/30/2022, 1:15 PM

## 2022-07-30 NOTE — Progress Notes (Signed)
  Transition of Care [facility]) Screening Note   Patient Details  Name: [name] Date of Birth: Dec 18, 1946   Transition of Care [facility]) CM/SW Contact:    Iona Beard, [place] Phone Number: 07/30/2022, 11:06 AM  CSW noted [facility] consult for CHF screen and that pt is high risk for readmission. Per chart review pt is currently intubated. TOC to follow and assess when pt is more medically stable and able to participate in assessment.   Transition of Care Department [facility]) has reviewed patient and no TOC needs have been identified at this time. We will continue to monitor patient advancement through interdisciplinary progression rounds. If new patient transition needs arise, please place a TOC consult.

## 2022-07-31 ENCOUNTER — Inpatient Hospital Stay (HOSPITAL_COMMUNITY): Payer: Medicare Other

## 2022-07-31 DIAGNOSIS — I501 Left ventricular failure: Secondary | ICD-10-CM | POA: Diagnosis not present

## 2022-07-31 DIAGNOSIS — N184 Chronic kidney disease, stage 4 (severe): Secondary | ICD-10-CM | POA: Diagnosis not present

## 2022-07-31 DIAGNOSIS — I2510 Atherosclerotic heart disease of native coronary artery without angina pectoris: Secondary | ICD-10-CM | POA: Diagnosis not present

## 2022-07-31 DIAGNOSIS — J810 Acute pulmonary edema: Secondary | ICD-10-CM

## 2022-07-31 DIAGNOSIS — J9601 Acute respiratory failure with hypoxia: Secondary | ICD-10-CM | POA: Diagnosis not present

## 2022-07-31 DIAGNOSIS — E876 Hypokalemia: Secondary | ICD-10-CM | POA: Diagnosis not present

## 2022-07-31 DIAGNOSIS — I5021 Acute systolic (congestive) heart failure: Secondary | ICD-10-CM | POA: Diagnosis not present

## 2022-07-31 DIAGNOSIS — I208 Other forms of angina pectoris: Secondary | ICD-10-CM | POA: Diagnosis not present

## 2022-07-31 DIAGNOSIS — I5020 Unspecified systolic (congestive) heart failure: Secondary | ICD-10-CM | POA: Diagnosis not present

## 2022-07-31 LAB — BASIC METABOLIC PANEL
Anion gap: 13 (ref 5–15)
BUN: 92 mg/dL — ABNORMAL HIGH (ref 8–23)
CO2: 28 mmol/L (ref 22–32)
Calcium: 9.5 mg/dL (ref 8.9–10.3)
Chloride: 101 mmol/L (ref 98–111)
Creatinine, Ser: 3.09 mg/dL — ABNORMAL HIGH (ref 0.61–1.24)
GFR, Estimated: 20 mL/min — ABNORMAL LOW (ref >60)
Glucose, Bld: 180 mg/dL — ABNORMAL HIGH (ref 70–99)
Potassium: 3.5 mmol/L (ref 3.5–5.1)
Sodium: 142 mmol/L (ref 135–145)

## 2022-07-31 LAB — CBC
HCT: 30.5 % — ABNORMAL LOW (ref 39.0–52.0)
Hemoglobin: 9.6 g/dL — ABNORMAL LOW (ref 13.0–17.0)
MCH: 31.3 pg (ref 26.0–34.0)
MCHC: 31.5 g/dL (ref 30.0–36.0)
MCV: 99.3 fL (ref 80.0–100.0)
Platelets: 227 10*3/uL (ref 150–400)
RBC: 3.07 MIL/uL — ABNORMAL LOW (ref 4.22–5.81)
RDW: 16.9 % — ABNORMAL HIGH (ref 11.5–15.5)
WBC: 11 10*3/uL — ABNORMAL HIGH (ref 4.0–10.5)
nRBC: 0 % (ref 0.0–0.2)

## 2022-07-31 LAB — GLUCOSE, CAPILLARY
Glucose-Capillary: 174 mg/dL — ABNORMAL HIGH (ref 70–99)
Glucose-Capillary: 150 mg/dL — ABNORMAL HIGH (ref 70–99)
Glucose-Capillary: 145 mg/dL — ABNORMAL HIGH (ref 70–99)
Glucose-Capillary: 139 mg/dL — ABNORMAL HIGH (ref 70–99)
Glucose-Capillary: 163 mg/dL — ABNORMAL HIGH (ref 70–99)

## 2022-07-31 LAB — PHOSPHORUS: Phosphorus: 3.4 mg/dL (ref 2.5–4.6)

## 2022-07-31 LAB — MAGNESIUM: Magnesium: 2.1 mg/dL (ref 1.7–2.4)

## 2022-07-31 MED ORDER — ATORVASTATIN CALCIUM 40 MG PO TABS
80.0000 mg | ORAL_TABLET | Freq: Every day | ORAL | Status: DC
  Administered 2022-07-31: 80 mg
  Filled 2022-07-31: qty 2

## 2022-07-31 MED ORDER — INSULIN ASPART 100 UNIT/ML IJ SOLN
0.0000 [IU] | Freq: Every day | INTRAMUSCULAR | Status: DC
  Administered 2022-08-02: 2 [IU] via SUBCUTANEOUS
  Administered 2022-08-04: 3 [IU] via SUBCUTANEOUS
  Administered 2022-08-05: 2 [IU] via SUBCUTANEOUS
  Administered 2022-08-06 – 2022-08-07 (×2): 3 [IU] via SUBCUTANEOUS
  Administered 2022-08-08 – 2022-08-09 (×2): 2 [IU] via SUBCUTANEOUS

## 2022-07-31 MED ORDER — ISOSORBIDE DINITRATE 20 MG PO TABS
40.0000 mg | ORAL_TABLET | Freq: Three times a day (TID) | ORAL | Status: DC
Start: 2022-07-31 — End: 2022-07-31
  Administered 2022-07-31: 40 mg
  Filled 2022-07-31: qty 2

## 2022-07-31 MED ORDER — ISOSORBIDE DINITRATE 20 MG PO TABS
40.0000 mg | ORAL_TABLET | Freq: Three times a day (TID) | ORAL | Status: AC
Start: 2022-07-31 — End: 2022-07-31
  Administered 2022-07-31 (×2): 40 mg via ORAL
  Filled 2022-07-31 (×2): qty 2

## 2022-07-31 MED ORDER — HYDRALAZINE HCL 25 MG PO TABS: 75.0000 mg | ORAL_TABLET | Freq: Three times a day (TID) | ORAL | Status: DC

## 2022-07-31 MED ORDER — ASPIRIN 81 MG PO CHEW
81.0000 mg | CHEWABLE_TABLET | Freq: Every day | ORAL | Status: DC
  Administered 2022-08-01 – 2022-08-10 (×10): 81 mg via ORAL
  Filled 2022-07-31 (×10): qty 1

## 2022-07-31 MED ORDER — POLYETHYLENE GLYCOL 3350 17 G PO PACK: 17.0000 g | PACK | Freq: Every day | ORAL | Status: DC | PRN

## 2022-07-31 MED ORDER — POTASSIUM CHLORIDE 20 MEQ PO PACK
20.0000 meq | PACK | Freq: Once | ORAL | Status: DC
  Filled 2022-07-31: qty 1

## 2022-07-31 MED ORDER — PANTOPRAZOLE SODIUM 40 MG PO TBEC
40.0000 mg | DELAYED_RELEASE_TABLET | Freq: Every day | ORAL | Status: DC
Start: 2022-08-01 — End: 2022-08-02
  Administered 2022-08-01 – 2022-08-02 (×2): 40 mg via ORAL
  Filled 2022-07-31 (×2): qty 1

## 2022-07-31 MED ORDER — INSULIN ASPART 100 UNIT/ML IJ SOLN
0.0000 [IU] | Freq: Three times a day (TID) | INTRAMUSCULAR | Status: DC
  Administered 2022-07-31: 1 [IU] via SUBCUTANEOUS
  Administered 2022-08-01 (×3): 2 [IU] via SUBCUTANEOUS
  Administered 2022-08-02: 3 [IU] via SUBCUTANEOUS
  Administered 2022-08-02 – 2022-08-03 (×4): 2 [IU] via SUBCUTANEOUS
  Administered 2022-08-03: 3 [IU] via SUBCUTANEOUS
  Administered 2022-08-04: 2 [IU] via SUBCUTANEOUS
  Administered 2022-08-04: 3 [IU] via SUBCUTANEOUS
  Administered 2022-08-05: 2 [IU] via SUBCUTANEOUS
  Administered 2022-08-05: 5 [IU] via SUBCUTANEOUS
  Administered 2022-08-05: 2 [IU] via SUBCUTANEOUS
  Administered 2022-08-06: 3 [IU] via SUBCUTANEOUS
  Administered 2022-08-06: 7 [IU] via SUBCUTANEOUS
  Administered 2022-08-07: 2 [IU] via SUBCUTANEOUS
  Administered 2022-08-07 – 2022-08-08 (×2): 3 [IU] via SUBCUTANEOUS
  Administered 2022-08-08: 2 [IU] via SUBCUTANEOUS
  Administered 2022-08-09: 5 [IU] via SUBCUTANEOUS
  Administered 2022-08-09: 1 [IU] via SUBCUTANEOUS
  Administered 2022-08-09: 5 [IU] via SUBCUTANEOUS
  Administered 2022-08-10: 2 [IU] via SUBCUTANEOUS
  Administered 2022-08-10: 1 [IU] via SUBCUTANEOUS
  Administered 2022-08-10: 2 [IU] via SUBCUTANEOUS

## 2022-07-31 MED ORDER — POTASSIUM CHLORIDE 20 MEQ PO PACK
20.0000 meq | PACK | Freq: Once | ORAL | Status: AC
  Administered 2022-07-31: 20 meq

## 2022-07-31 MED ORDER — ATORVASTATIN CALCIUM 40 MG PO TABS
80.0000 mg | ORAL_TABLET | Freq: Every day | ORAL | Status: DC
  Administered 2022-08-01 – 2022-08-10 (×10): 80 mg via ORAL
  Filled 2022-07-31 (×10): qty 2

## 2022-07-31 MED ORDER — FUROSEMIDE 10 MG/ML IJ SOLN
120.0000 mg | Freq: Two times a day (BID) | INTRAMUSCULAR | Status: DC
  Administered 2022-07-31 – 2022-08-01 (×2): 120 mg via INTRAVENOUS
  Filled 2022-07-31 (×4): qty 12

## 2022-07-31 MED ORDER — CLONIDINE HCL 0.1 MG PO TABS
0.1000 mg | ORAL_TABLET | Freq: Two times a day (BID) | ORAL | Status: DC
  Administered 2022-07-31 – 2022-08-02 (×4): 0.1 mg via ORAL
  Filled 2022-07-31 (×4): qty 1

## 2022-07-31 MED ORDER — HYDRALAZINE HCL 25 MG PO TABS
100.0000 mg | ORAL_TABLET | Freq: Three times a day (TID) | ORAL | Status: DC
Start: 2022-07-31 — End: 2022-07-31
  Administered 2022-07-31: 100 mg
  Filled 2022-07-31: qty 4

## 2022-07-31 MED ORDER — PANTOPRAZOLE SODIUM 40 MG PO TBEC: 40.0000 mg | DELAYED_RELEASE_TABLET | Freq: Every day | ORAL | Status: DC

## 2022-07-31 MED ORDER — ALLOPURINOL 100 MG PO TABS
100.0000 mg | ORAL_TABLET | Freq: Every day | ORAL | Status: DC
  Administered 2022-08-01 – 2022-08-10 (×10): 100 mg via ORAL
  Filled 2022-07-31 (×10): qty 1

## 2022-07-31 MED ORDER — CARVEDILOL 3.125 MG PO TABS
6.2500 mg | ORAL_TABLET | Freq: Two times a day (BID) | ORAL | Status: DC
Start: 2022-07-31 — End: 2022-08-11
  Administered 2022-07-31 – 2022-08-10 (×18): 6.25 mg via ORAL
  Filled 2022-07-31 (×19): qty 2

## 2022-07-31 MED ORDER — HYDRALAZINE HCL 25 MG PO TABS
100.0000 mg | ORAL_TABLET | Freq: Three times a day (TID) | ORAL | Status: DC
  Administered 2022-07-31 – 2022-08-10 (×27): 100 mg via ORAL
  Filled 2022-07-31 (×29): qty 4

## 2022-07-31 MED ORDER — ISOSORBIDE MONONITRATE ER 60 MG PO TB24
120.0000 mg | ORAL_TABLET | Freq: Every day | ORAL | Status: DC
  Administered 2022-08-01 – 2022-08-10 (×10): 120 mg via ORAL
  Filled 2022-07-31 (×10): qty 2

## 2022-07-31 NOTE — Progress Notes (Signed)
Pulmonary and Critical Care Medicine   Patient name: [name] Admit date: 07/28/2022  DOB: 1946/09/04 LOS: 2  MRN: [id number] Consult date: 07/29/2022  Referring provider: Dr. [name], [facility] CC: Short of breath    History:  76 yo male former smoker presented to [facility] ER with chest pain and dyspnea for 2 days.  He was admitted for exacerbation of heart failure with renal failure.  BP was as high as 194/97.  He was started on Bipap.  He was seen by cardiology and nephrology.  PCCM consulted to assist with respiratory management.  Past medical history:  Anxiety, BPH, CAD s/p DES, CKD 4, DJD, Depression, DM type 2, Diastolic CHF, ED, GERD, Gout, HLD, HTN, Neuropathy, Vertigo  Significant events:  8/06 Admit 8/07 cardiology/nephrology consulted, intubated 8/09 extubated  Studies:  Echo 8/07 >> EF 35 to 40%, severe global hypokinesis worse at apex  Micro:    Lines:  ETT 8/07 >> 8/09   Antibiotics:    Consults:  Cardiology Nephrology    Interim history:  Extubated earlier this morning.  Denies chest pain.  Breathing okay.  Intermittent cough.  Has sore throat and wants something to drink.  Vital signs:  BP (!) 143/50   Pulse 89   Temp 98.8 F (37.1 C) (Oral)   Resp 18   Ht 6' 2"  (1.88 m)   Wt 119.7 kg   SpO2 100%   BMI 33.88 kg/m   Intake/output:  I/O last 3 completed shifts: In: 1947.1 [I.V.:454.9; NG/GT:555; IV Piggyback:937.1] Out: 4098 [Urine:9150]   Physical exam:   General - alert Eyes - pupils reactive ENT - no sinus tenderness, no stridor Cardiac - irregular Chest - better air movement, scattered rhonchi Abdomen - soft, non tender, + bowel sounds Extremities - decreased leg edema Skin - no rashes Neuro - follows simple commands, able to move all extremities but weak  Best practice:   DVT - SQ heparin SUP - protonix Nutrition - renal/carb modified   Assessment/plan:   Acute hypoxic respiratory failure from  acute pulmonary edema. - successfully extubated 8/09 - prn oxygen to keep SpO2 > 92% - monitor for evidence of sleep apnea  Hypertensive urgency. Acute on chronic diastolic CHF. New onset acute systolic CHF. Atrial fibrillation. Hx of CAD s/p DES, HLD. - cardiology consulted - continue ASA, coreg, catapres, hydralazine, isosorbide  Stage 4 CKD. Hypokalemia. - he indicated to me on 8/07 that he would be okay with dialysis if needed - continue lasix per nephrology  Deconditioning. - PT to consult on 8/10  DM type 2. Anemia of chronic disease and iron deficiency. GERD. Gout. Chronic Lt leg wound. - per primary team  Resolved hospital problems:    Goals of care/Family discussions:  Code status: full code  Labs:      Latest Ref Rng & Units 07/31/2022    4:33 AM 07/30/2022    3:31 AM 07/29/2022    8:49 PM  CMP  Glucose 70 - 99 mg/dL 180  145  148   BUN 8 - 23 mg/dL 92  79  79   Creatinine 0.61 - 1.24 mg/dL 3.09  3.28  3.34   Sodium 135 - 145 mmol/L 142  143  141   Potassium 3.5 - 5.1 mmol/L 3.5  3.7  3.6    3.6   Chloride 98 - 111 mmol/L 101  104  105   CO2 22 - 32 mmol/L 28  27  26    Calcium 8.9 -  10.3 mg/dL 9.5  8.9  8.5        Latest Ref Rng & Units 07/31/2022    4:33 AM 07/30/2022    3:31 AM 07/28/2022   11:28 PM  CBC  WBC 4.0 - 10.5 K/uL 11.0  10.4  11.6   Hemoglobin 13.0 - 17.0 g/dL 9.6  8.0  8.2   Hematocrit 39.0 - 52.0 % 30.5  24.8  25.0   Platelets 150 - 400 K/uL 227  186  227     ABG    Component Value Date/Time   PHART 7.47 (H) 07/30/2022 0430   PCO2ART 40 07/30/2022 0430   PO2ART 75 (L) 07/30/2022 0430   HCO3 29.1 (H) 07/30/2022 0430   O2SAT 97.8 07/30/2022 0430    CBG (last 3)  Recent Labs    07/31/22 0357 07/31/22 0806 07/31/22 1140  GLUCAP 174* 150* 145*    Signature:  Chesley Mires, MD [place] Pager - [phone] 07/31/2022, 1:24 PM

## 2022-07-31 NOTE — Progress Notes (Signed)
Nephrology Progress Note:  Subjective:   He had 6.9 liters UOP over 8/8 charted.  has continued in the ICU.  Spoke with RN who reports a post void bladder scan earlier this week as acceptable.  Pt hasn't had a foley.   Review of systems:  Unable to obtain 2/2 intubation   Objective Vital signs in last 24 hours: Vitals:   07/31/22 0745 07/31/22 0800 07/31/22 0833 07/31/22 0845  BP: (!) 133/55 (!) 171/69  (!) 155/69  Pulse:   85 85  Resp: 18 18 14 12   Temp:  99.7 F (37.6 C)    TempSrc:  Axillary    SpO2: 100% 100% 100% 100%  Weight:      Height:       Weight change: -3.1 kg  Intake/Output Summary (Last 24 hours) at 07/31/2022 1601 Last data filed at 07/31/2022 0932 Gross per 24 hour  Intake 1890.53 ml  Output 7350 ml  Net -5459.47 ml    Assessment/Plan: 76 year old BM with DM, HTN, diastolic heart failure,  CAD and CKD-  presenting with SOB and CP  1.AKI- advanced CKD at baseline.  He has had volume overload contributing to respiratory failure.   - There is no acute indication for dialysis.  - pause diuretics with profound response over 8/8.  Then resume at lasix 120 mg IV every 12 hours. Metolazone is off currently. - Per previous nephrology charting:  cardiology note says pt has refused dialysis -  not able to locate other documentation of this and patient new to me today.  He was not asked by my colleague when he was in distress.  According to Dr. [name] he consented to HD if needed but not needed today as above  2. Hypertension/volume  - hypertensive and volume overloaded.  Volume status improving.   3. Anemia  - has been getting iron and ESA as outpatient.  S/p IV iron and on aranesp 200 mcg every Tuesday   4. Hypokalemia-  replete   5. Acute systolic heart failure  cardiology following.  Medical management recommended at this time. Optimize volume status as above  6. Acute hypoxic resp failure - intubated. Optimize volume status above.    Labs: Basic Metabolic  Panel: Recent Labs  Lab 07/29/22 2049 07/30/22 0331 07/31/22 0433  NA 141 143 142  K 3.6  3.6 3.7 3.5  CL 105 104 101  CO2 26 27 28   GLUCOSE 148* 145* 180*  BUN 79* 79* 92*  CREATININE 3.34* 3.28* 3.09*  CALCIUM 8.5* 8.9 9.5  PHOS  --   --  3.4   Liver Function Tests: Recent Labs  Lab 07/28/22 2328  AST 41  ALT 39  ALKPHOS 59  BILITOT 0.4  PROT 7.6  ALBUMIN 4.2   No results for input(s): "LIPASE", "AMYLASE" in the last 168 hours. No results for input(s): "AMMONIA" in the last 168 hours. CBC: Recent Labs  Lab 07/28/22 2328 07/30/22 0331 07/31/22 0433  WBC 11.6* 10.4 11.0*  NEUTROABS 9.0*  --   --   HGB 8.2* 8.0* 9.6*  HCT 25.0* 24.8* 30.5*  MCV 97.3 98.8 99.3  PLT 227 186 227   Cardiac Enzymes: No results for input(s): "CKTOTAL", "CKMB", "CKMBINDEX", "TROPONINI" in the last 168 hours. CBG: Recent Labs  Lab 07/30/22 1540 07/30/22 2103 07/30/22 2349 07/31/22 0357 07/31/22 0806  GLUCAP 120* 141* 159* 174* 150*    Iron Studies: No results for input(s): "IRON", "TIBC", "TRANSFERRIN", "FERRITIN" in the last 72 hours. Studies/Results:  DG CHEST PORT 1 VIEW  Result Date: 07/31/2022 CLINICAL DATA:  76 year old male status post endotracheal tube placement. Heart failure. EXAM: PORTABLE CHEST 1 VIEW COMPARISON:  Chest x-ray 07/31/2022. FINDINGS: An endotracheal tube is in place with tip 5.3 cm above the carina. A nasogastric tube is seen extending into the stomach, however, the tip of the nasogastric tube extends below the lower margin of the image. Lung volumes are low. Bibasilar opacities may reflect areas of atelectasis and/or consolidation, potentially with superimposed small left pleural effusion. No definite right pleural effusion. Ill-defined opacities and areas of interstitial prominence are also noted throughout the mid to lower lungs bilaterally. Crowding and cephalization of the pulmonary vasculature. No pneumothorax. Heart size is mildly enlarged. Upper  mediastinal contours are within normal limits. IMPRESSION: 1. Support apparatus, as above. 2. The appearance the chest may suggest mild congestive heart failure. In addition, there are bibasilar opacities favored to reflect areas of subsegmental atelectasis, with superimposed small left pleural effusion. Electronically Signed   By: Vinnie Langton M.D.   On: 07/31/2022 05:18   DG Chest Portable 1 View  Result Date: 07/31/2022 CLINICAL DATA:  Endotracheal tube malposition EXAM: PORTABLE CHEST 1 VIEW COMPARISON:  None Available. FINDINGS: The endotracheal tube is seen above the level of the clavicular heads 10.9 cm above the carina. The balloon appears hyperinflated within the hypopharynx. Nasogastric tube extends into the upper abdomen beyond the margin of the examination. Pulmonary insufflation is normal and stable since prior examination. No pneumothorax or pleural effusion. Mild cardiomegaly is stable. Superimposed bilateral perihilar pulmonary infiltrates appear improved in the interval since prior examination, infection versus edema. IMPRESSION: 1. High position of the endotracheal tube. The balloon appears hyperinflated within the hypopharynx. Advancement of the endotracheal tube by 5-6 cm would better position the device. 2. Improving bilateral perihilar pulmonary infiltrates, infection versus edema. 3. Stable cardiomegaly. Electronically Signed   By: Fidela Salisbury M.D.   On: 07/31/2022 04:37   DG Chest Port 1 View  Addendum Date: 07/30/2022   ADDENDUM REPORT: 07/30/2022 08:00 ADDENDUM: Additional image done at 3:30 p.m. on 07/29/2022 is submitted for review. Tip of NG tube is seen in the fundus of the stomach. Side port an NG tube is noted close to the gastroesophageal junction. Electronically Signed   By: Elmer Picker M.D.   On: 07/30/2022 08:00   Result Date: 07/30/2022 CLINICAL DATA:  Placement of endotracheal tube and orogastric tube EXAM: PORTABLE CHEST 1 VIEW COMPARISON:  Previous  studies including the examination of 07/28/2022 FINDINGS: Transverse diameter heart is increased. There is interval worsening of pulmonary vascular congestion. Interval appearance of extensive alveolar densities are seen in the parahilar regions and lower lung fields, more so on the left side. Lateral CP angles are indistinct. There is no pneumothorax. Tip of endotracheal tube is 6.6 cm above the carina. NG tube is not visualized in chest. IMPRESSION: Cardiomegaly. There is interval worsening of pulmonary vascular congestion. Extensive alveolar infiltrates are seen in both lungs suggesting pulmonary edema or multifocal pneumonia with interval worsening. NG tube is not visualized and may be coiled within the mouth or oropharynx. Electronically Signed: By: Elmer Picker M.D. On: 07/29/2022 15:46   DG Chest Port 1 View  Result Date: 07/30/2022 CLINICAL DATA:  Chest pain and dyspnea. EXAM: PORTABLE CHEST 1 VIEW COMPARISON:  Portable chest 07/29/2022 FINDINGS: 4:55 a.m. ETT tip is 6.2 cm from the carina. Interval insertion NGT with the tip in the body of stomach. There is spinal stimulator  wiring in the distal thoracic spinal canal. The heart is enlarged. The aorta is tortuous and ectatic with stable mediastinum. Aortic atherosclerosis. There is persistent perihilar vascular congestion and extensive bilateral perihilar infiltrates, more confluent on the left, with bilateral peripheral sparing and small pleural effusions. No new or worsening opacities seen. Overall aeration is generally stable. IMPRESSION: Persistent left-greater-than-right perihilar infiltrates with cardiomegaly and central vascular engorgement. Findings could be due to alveolar pulmonary edema, pneumonia or combination. Stable overall aeration noted today with no new or worsening infiltrates. Small pleural effusions appear similar. NGT tip in the body of stomach with stable ETT positioning. Aortic atherosclerosis. Electronically Signed   By:  Telford Nab M.D.   On: 07/30/2022 07:21   ECHOCARDIOGRAM LIMITED  Result Date: 07/29/2022    ECHOCARDIOGRAM LIMITED REPORT   Patient Name:   [name] Date of Exam: 07/29/2022 Medical Rec #:  [id number]       Height:       74.0 in Accession #:    [id number]      Weight:       270.7 lb Date of Birth:  1946/02/01       BSA:          2.472 m Patient Age:    24 years        BP:           168/79 mmHg Patient Gender: M               HR:           80 bpm. Exam Location:  [facility] Procedure: Limited Echo and Intracardiac Opacification Agent Indications:    CHF  History:        Patient has prior history of Echocardiogram examinations, most                 recent 02/07/2022. CHF, CAD and Previous Myocardial Infarction,                 Arrythmias:PVC, Signs/Symptoms:Chest Pain; Risk                 Factors:Hypertension, Diabetes and Dyslipidemia. Patient on                 Bi-Pap.  Sonographer:    Wenda Low Referring Phys: [phone] Nadean Corwin HILTY  Sonographer Comments: Patient is morbidly obese. IMPRESSIONS  1. Limited echo for LVEF and regional wall motion.  2. No apical thrombus by Definity contrast. Left ventricular ejection fraction, by estimation, is 35 to 40%. Left ventricular ejection fraction by 2D MOD biplane is 36.6 %. The left ventricle has moderately decreased function. The left ventricle demonstrates severe global hypokinesis, worse at the apex. Regional wall motion is difficult to ascertain given frequent PVC's.  3. The inferior vena cava is dilated in size with <50% respiratory variability, suggesting right atrial pressure of 15 mmHg.  4. Right ventricular systolic function was not well visualized. The right ventricular size is not well visualized.  5. Rhythm strip during this exam demonstrates atrial fibrillation. Comparison(s): Changes from prior study are noted. 02/07/2022: LVEF 60-65%, no regional WMA's. FINDINGS  Left Ventricle: No apical thrombus by Definity contrast. Left ventricular ejection  fraction, by estimation, is 35 to 40%. Left ventricular ejection fraction by 2D MOD biplane is 36.6 %. The left ventricle has moderately decreased function. The left ventricle demonstrates global hypokinesis. Definity contrast agent was given IV to delineate the left ventricular endocardial borders. The left ventricular internal cavity  size was normal in size. There is moderate left ventricular hypertrophy. Right Ventricle: The right ventricular size is not well visualized. Right vetricular wall thickness was not well visualized. Right ventricular systolic function was not well visualized. Aorta: The aortic root and ascending aorta are structurally normal, with no evidence of dilitation. Venous: The inferior vena cava is dilated in size with less than 50% respiratory variability, suggesting right atrial pressure of 15 mmHg. EKG: Rhythm strip during this exam demonstrates atrial fibrillation. LEFT VENTRICLE PLAX 2D                        Biplane EF (MOD) LVIDd:         6.80 cm         LV Biplane EF:   Left LVIDs:         4.60 cm                          ventricular LV PW:         1.40 cm                          ejection LV IVS:        1.50 cm                          fraction by LVOT diam:     2.00 cm                          2D MOD LVOT Area:     3.14 cm                         biplane is                                                 36.6 %.  LV Volumes (MOD) LV vol d, MOD    122.0 ml A2C: LV vol d, MOD    128.0 ml A4C: LV vol s, MOD    74.4 ml A2C: LV vol s, MOD    89.6 ml A4C: LV SV MOD A2C:   47.6 ml LV SV MOD A4C:   128.0 ml LV SV MOD BP:    49.2 ml LEFT ATRIUM         Index LA diam:    5.50 cm 2.22 cm/m   AORTA Ao Root diam: 3.30 cm  SHUNTS Systemic Diam: 2.00 cm Lyman Bishop MD Electronically signed by Lyman Bishop MD Signature Date/Time: 07/29/2022/4:28:09 PM    Final    Medications: Infusions:  sodium chloride     dexmedetomidine (PRECEDEX) IV infusion Stopped (07/29/22 2143)   fentaNYL infusion  INTRAVENOUS 175 mcg/hr (07/30/22 2353)   ferric gluconate (FERRLECIT) IVPB Stopped (07/30/22 1107)   furosemide 160 mg (07/31/22 0821)    Scheduled Medications:  allopurinol  100 mg Per Tube Daily   aspirin  81 mg Per Tube Daily   atorvastatin  80 mg Per Tube Daily   carvedilol  6.25 mg Per Tube BID WC   Chlorhexidine Gluconate Cloth  6 each Topical Q0600   cloNIDine  0.1 mg Per Tube BID   darbepoetin (ARANESP) injection -  NON-DIALYSIS  200 mcg Subcutaneous Q Tue-1800   docusate  100 mg Per Tube BID   feeding supplement (VITAL HIGH PROTEIN)  1,000 mL Per Tube Q24H   heparin  5,000 Units Subcutaneous Q8H   hydrALAZINE  100 mg Per Tube TID   insulin aspart  0-9 Units Subcutaneous Q4H   isosorbide mononitrate  120 mg Oral Daily   mouth rinse  15 mL Mouth Rinse Q2H   pantoprazole sodium  40 mg Per Tube Daily   polyethylene glycol  17 g Per Tube Daily   sodium chloride flush  3 mL Intravenous Q12H    have reviewed scheduled and prn medications.  Physical exam: General adult male in bed in no acute distress HEENT normocephalic atraumatic extraocular movements intact sclera anicteric Neck supple trachea midline Lungs coarse mech breath sounds FIO2 35 and PEEP 5 Heart S1S2 no rub Abdomen soft nontender nondistended Extremities 1+ edema present but improved; chronic trophic changes Neuro - awake and interactive; sedation currently running GU - external catheter in place   Claudia Desanctis, MD 07/31/2022 9:27 AM  LOS: 2 days

## 2022-07-31 NOTE — Progress Notes (Addendum)
PROGRESS NOTE  [name] WRU:[id number] DOB: 10/24/1946 DOA: 07/28/2022 PCP: Redmond School, MD  Brief History:   76 y.o. male with medical history significant of hypertension, hyperlipidemia, diastolic heart failure, CAD s/p stent, CKD stage IV, and obesity who presents with complaints of shortness of breath and chest pain starting 2 days ago.  Chest pain is substernal and feels like pressure. He chronically has orthopnea and has a bed that he always sleeps sitting up.  He has been feeling short of breath even when he is not doing anything which is not normal for him.  Denies having any fever, nausea, vomiting, diarrhea, abdominal pain, or diaphoresis.   Patient reports that he is followed by Dr. Harl Bowie of cardiology and at the end of June had been recommended to increase his Imdur to 60 mg twice daily.  He states that this initially helped decrease symptoms of chest pain.  Then he was told to start taking furosemide 40 mg twice daily by his nephrologist Dr. Theador Hawthorne on 6/12 and was started on torsemide 100 mg daily.  Since doing that patient reports that he has been swelling more.  He developed a blister on his left leg due to the swelling and he has been putting ointment on it.  Denies having any redness or erythema from the wound.   Upon admission into the emergency patient was seen to be afebrile with blood pressures 140/82 to 158/73 and all other vital signs maintained.  Labs significant for WBC 11.6, hemoglobin 8.2, potassium 3.2, BUN 74, creatinine 3.38, glucose 170, BNP 651, and high-sensitivity troponin 42. Chest x-ray showed cardiomegaly with vascular congestion.  Patient has been given Lasix 40 mg IV, potassium chloride 40 mEq, and magnesium oxide 800 mg p.o.  07/29/2022: Pt decompensated on med surg unit and rapid response called and patient placed on bipap and transferred to stepdown ICU. Cardiology and nephrology consulted and patient started on maximal dose IV lasix by  nephrology.  Pt noted to be in atrial fibrillation.  Family was called by MD and updated with change in status and treatment plan.    07/30/2022: Pt was intubated on 8/7 after failed bipap trial.  Afib converted to NSR.  He has diuresed >3L on high dose lasix therapy.     Assessment/Plan: Acute respiratory failure with hypoxia  - secondary to acute heart failure exacerbation and atrial fibrillation - Trial of bipap failed and patient intubated by PCCM Dr. Halford Chessman.  - IV lasix maximal doses ordered per nephrology with good diuresis noted - cardiology consultation  - nephrology consultation - PCCM consultation appreciated - extubated 8/9>>stable on 4L   Acute HFrEF - pt is volume overloaded from poor renal function and possibly change to diuretic - he is massively volume overloaded -IV lasix 120 mg q 12 ordered by nephrology, follow intake and output and weights closely - limited echo 8/23: LVEF 35-40%, LV global hypokinesis - weight trending down with diuresis  - follow I/O (now neg 10L)   Hypertensive urgency - resume home meds and IV hydralazine ordered - IV lasix high dose for diuresis should also help with blood pressures - soft BPs noted on precedex, agree with nephrology, changed BP meds to PRN to avoid prolonged low BP   Stage IV CKD - appreciate nephrology consultation  - baseline creatinine 3.0-3.3 - he agrees to HD if he should needed it -- discussed with pt and nephew on 07/31/22  Chest pain /  longstanding angina / CAD - appreciate cardiology consult s/p DES to LAD and D1 in 10/2012 with nonobstructive disease along RCA and LCx. Most recent ischemic evaluation was a low-risk NST in 04/2022 - he is being treated medically with ASA 81 mg, coreg, pravastatin - not cath candidate due to CKD   Leukocytosis  - stress reaction likely  - follow, no s/s of infection found at this time    Hypokalemia  - repleted - mag 2.1   Essential hypertension  - resumed home carvedilol,  clonidine, isosorbide, hydralazine    Diabetes mellitus, type 2, controlled with renal complications - continue close CBG monitoring and SSI coverage   Paroxysmal Atrial Fibrillation  - new diagnosis this admission -  His CHA2DS2-VASc Score is at least 6. Has not yet been started on anticoagulation, likely due to his chronic anemia  -Unless noted to have definitive atrial fibrillation, would prefer not to commit to long-term anticoagulation.    Anemia of chronic renal disease - low threshold to transfuse for Hg<8 as it contributes to chest pain and respiratory distress    GERD  - protonix for GI protection    Hyperlipidemia - resume statin   Gout  - continue allopurinolol  - watch very closely in setting of aggressive diuresis and CKD   Obesity class I - BMI 33.88 - lifestyle modification      Family Communication:   nephew updated at bedside 8/9  Consultants:  renal, pulm, cardiology  Code Status:  FULL   DVT Prophylaxis:  [place] Heparin    Procedures: As Listed in Progress Note Above  Antibiotics: None     Subjective: Patient denies fevers, chills, headache, chest pain, dyspnea, nausea, vomiting, diarrhea, abdominal pain, dysuria   Objective: Vitals:   07/31/22 1300 07/31/22 1330 07/31/22 1332 07/31/22 1400  BP: (!) 104/55 (!) 126/48  (!) 113/55  Pulse: 89 85  85  Resp: 18  18 16   Temp:      TempSrc:      SpO2: 100% 100%  100%  Weight:      Height:        Intake/Output Summary (Last 24 hours) at 07/31/2022 1445 Last data filed at 07/31/2022 1011 Gross per 24 hour  Intake 1314.21 ml  Output 5250 ml  Net -3935.79 ml   Weight change: -3.1 kg Exam:  General:  Pt is alert, follows commands appropriately, not in acute distress HEENT: No icterus, No thrush, No neck mass, [place]/AT Cardiovascular: RRR, S1/S2, no rubs, no gallops Respiratory: bilateral rales. No wheeze Abdomen: Soft/+BS, non tender, non distended, no guarding Extremities: 1 + LE edema,  No lymphangitis, No petechiae, No rashes, no synovitis   Data Reviewed: I have personally reviewed following labs and imaging studies Basic Metabolic Panel: Recent Labs  Lab 07/28/22 2328 07/29/22 0213 07/29/22 2049 07/30/22 0331 07/31/22 0433  NA 137  --  141 143 142  K 3.2*  --  3.6  3.6 3.7 3.5  CL 103  --  105 104 101  CO2 23  --  26 27 28   GLUCOSE 170*  --  148* 145* 180*  BUN 74*  --  79* 79* 92*  CREATININE 3.38*  --  3.34* 3.28* 3.09*  CALCIUM 8.9  --  8.5* 8.9 9.5  MG  --  1.9 2.2 2.1 2.1  PHOS  --   --   --   --  3.4   Liver Function Tests: Recent Labs  Lab 07/28/22 2328  AST 41  ALT 39  ALKPHOS 59  BILITOT 0.4  PROT 7.6  ALBUMIN 4.2   No results for input(s): "LIPASE", "AMYLASE" in the last 168 hours. No results for input(s): "AMMONIA" in the last 168 hours. Coagulation Profile: No results for input(s): "INR", "PROTIME" in the last 168 hours. CBC: Recent Labs  Lab 07/28/22 2328 07/30/22 0331 07/31/22 0433  WBC 11.6* 10.4 11.0*  NEUTROABS 9.0*  --   --   HGB 8.2* 8.0* 9.6*  HCT 25.0* 24.8* 30.5*  MCV 97.3 98.8 99.3  PLT 227 186 227   Cardiac Enzymes: No results for input(s): "CKTOTAL", "CKMB", "CKMBINDEX", "TROPONINI" in the last 168 hours. BNP: Invalid input(s): "POCBNP" CBG: Recent Labs  Lab 07/30/22 2103 07/30/22 2349 07/31/22 0357 07/31/22 0806 07/31/22 1140  GLUCAP 141* 159* 174* 150* 145*   HbA1C: No results for input(s): "HGBA1C" in the last 72 hours. Urine analysis:    Component Value Date/Time   COLORURINE STRAW (A) 07/29/2022 0230   APPEARANCEUR CLEAR 07/29/2022 0230   LABSPEC 1.005 07/29/2022 0230   PHURINE 7.0 07/29/2022 0230   GLUCOSEU NEGATIVE 07/29/2022 0230   HGBUR MODERATE (A) 07/29/2022 0230   BILIRUBINUR NEGATIVE 07/29/2022 0230   KETONESUR NEGATIVE 07/29/2022 0230   PROTEINUR NEGATIVE 07/29/2022 0230   UROBILINOGEN 1.0 03/02/2015 1100   NITRITE NEGATIVE 07/29/2022 0230   LEUKOCYTESUR NEGATIVE 07/29/2022  0230   Sepsis Labs: @LABRCNTIP (procalcitonin:4,lacticidven:4) ) Recent Results (from the past 240 hour(s))  MRSA Next Gen by PCR, Nasal     Status: None   Collection Time: 07/29/22  9:00 AM   Specimen: Nasal Mucosa; Nasal Swab  Result Value Ref Range Status   MRSA by PCR Next Gen NOT DETECTED NOT DETECTED Final    Comment: (NOTE) The GeneXpert MRSA Assay (FDA approved for NASAL specimens only), is one component of a comprehensive MRSA colonization surveillance program. It is not intended to diagnose MRSA infection nor to guide or monitor treatment for MRSA infections. Test performance is not FDA approved in patients less than 14 years old. Performed at [facility], [address], [place], [place] [address]      Scheduled Meds:  [START ON 08/01/2022] allopurinol  100 mg Oral Daily   [START ON 08/01/2022] aspirin  81 mg Oral Daily   [START ON 08/01/2022] atorvastatin  80 mg Oral Daily   carvedilol  6.25 mg Oral BID WC   Chlorhexidine Gluconate Cloth  6 each Topical Q0600   cloNIDine  0.1 mg Oral BID   darbepoetin (ARANESP) injection - NON-DIALYSIS  200 mcg Subcutaneous Q Tue-1800   heparin  5,000 Units Subcutaneous Q8H   hydrALAZINE  100 mg Oral TID   insulin aspart  0-5 Units Subcutaneous QHS   insulin aspart  0-9 Units Subcutaneous TID WC   isosorbide dinitrate  40 mg Oral TID   [START ON 08/01/2022] isosorbide mononitrate  120 mg Oral Daily   [START ON 08/01/2022] pantoprazole  40 mg Oral Daily   sodium chloride flush  3 mL Intravenous Q12H   Continuous Infusions:  sodium chloride     ferric gluconate (FERRLECIT) IVPB 250 mg (07/31/22 1056)   furosemide      Procedures/Studies: DG CHEST PORT 1 VIEW  Result Date: 07/31/2022 CLINICAL DATA:  76 year old male status post endotracheal tube placement. Heart failure. EXAM: PORTABLE CHEST 1 VIEW COMPARISON:  Chest x-ray 07/31/2022. FINDINGS: An endotracheal tube is in place with tip 5.3 cm above the carina. A nasogastric tube is  seen extending into the stomach, however, the  tip of the nasogastric tube extends below the lower margin of the image. Lung volumes are low. Bibasilar opacities may reflect areas of atelectasis and/or consolidation, potentially with superimposed small left pleural effusion. No definite right pleural effusion. Ill-defined opacities and areas of interstitial prominence are also noted throughout the mid to lower lungs bilaterally. Crowding and cephalization of the pulmonary vasculature. No pneumothorax. Heart size is mildly enlarged. Upper mediastinal contours are within normal limits. IMPRESSION: 1. Support apparatus, as above. 2. The appearance the chest may suggest mild congestive heart failure. In addition, there are bibasilar opacities favored to reflect areas of subsegmental atelectasis, with superimposed small left pleural effusion. Electronically Signed   By: Vinnie Langton M.D.   On: 07/31/2022 05:18   DG Chest Portable 1 View  Result Date: 07/31/2022 CLINICAL DATA:  Endotracheal tube malposition EXAM: PORTABLE CHEST 1 VIEW COMPARISON:  None Available. FINDINGS: The endotracheal tube is seen above the level of the clavicular heads 10.9 cm above the carina. The balloon appears hyperinflated within the hypopharynx. Nasogastric tube extends into the upper abdomen beyond the margin of the examination. Pulmonary insufflation is normal and stable since prior examination. No pneumothorax or pleural effusion. Mild cardiomegaly is stable. Superimposed bilateral perihilar pulmonary infiltrates appear improved in the interval since prior examination, infection versus edema. IMPRESSION: 1. High position of the endotracheal tube. The balloon appears hyperinflated within the hypopharynx. Advancement of the endotracheal tube by 5-6 cm would better position the device. 2. Improving bilateral perihilar pulmonary infiltrates, infection versus edema. 3. Stable cardiomegaly. Electronically Signed   By: Fidela Salisbury M.D.    On: 07/31/2022 04:37   DG Chest Port 1 View  Addendum Date: 07/30/2022   ADDENDUM REPORT: 07/30/2022 08:00 ADDENDUM: Additional image done at 3:30 p.m. on 07/29/2022 is submitted for review. Tip of NG tube is seen in the fundus of the stomach. Side port an NG tube is noted close to the gastroesophageal junction. Electronically Signed   By: Elmer Picker M.D.   On: 07/30/2022 08:00   Result Date: 07/30/2022 CLINICAL DATA:  Placement of endotracheal tube and orogastric tube EXAM: PORTABLE CHEST 1 VIEW COMPARISON:  Previous studies including the examination of 07/28/2022 FINDINGS: Transverse diameter heart is increased. There is interval worsening of pulmonary vascular congestion. Interval appearance of extensive alveolar densities are seen in the parahilar regions and lower lung fields, more so on the left side. Lateral CP angles are indistinct. There is no pneumothorax. Tip of endotracheal tube is 6.6 cm above the carina. NG tube is not visualized in chest. IMPRESSION: Cardiomegaly. There is interval worsening of pulmonary vascular congestion. Extensive alveolar infiltrates are seen in both lungs suggesting pulmonary edema or multifocal pneumonia with interval worsening. NG tube is not visualized and may be coiled within the mouth or oropharynx. Electronically Signed: By: Elmer Picker M.D. On: 07/29/2022 15:46   DG Chest Port 1 View  Result Date: 07/30/2022 CLINICAL DATA:  Chest pain and dyspnea. EXAM: PORTABLE CHEST 1 VIEW COMPARISON:  Portable chest 07/29/2022 FINDINGS: 4:55 a.m. ETT tip is 6.2 cm from the carina. Interval insertion NGT with the tip in the body of stomach. There is spinal stimulator wiring in the distal thoracic spinal canal. The heart is enlarged. The aorta is tortuous and ectatic with stable mediastinum. Aortic atherosclerosis. There is persistent perihilar vascular congestion and extensive bilateral perihilar infiltrates, more confluent on the left, with bilateral peripheral  sparing and small pleural effusions. No new or worsening opacities seen. Overall aeration is generally  stable. IMPRESSION: Persistent left-greater-than-right perihilar infiltrates with cardiomegaly and central vascular engorgement. Findings could be due to alveolar pulmonary edema, pneumonia or combination. Stable overall aeration noted today with no new or worsening infiltrates. Small pleural effusions appear similar. NGT tip in the body of stomach with stable ETT positioning. Aortic atherosclerosis. Electronically Signed   By: Telford Nab M.D.   On: 07/30/2022 07:21   ECHOCARDIOGRAM LIMITED  Result Date: 07/29/2022    ECHOCARDIOGRAM LIMITED REPORT   Patient Name:   MARRELL DICAPRIO Date of Exam: 07/29/2022 Medical Rec #:  [id number]       Height:       74.0 in Accession #:    [id number]      Weight:       270.7 lb Date of Birth:  27-Oct-1946       BSA:          2.472 m Patient Age:    13 years        BP:           168/79 mmHg Patient Gender: M               HR:           80 bpm. Exam Location:  [facility] Procedure: Limited Echo and Intracardiac Opacification Agent Indications:    CHF  History:        Patient has prior history of Echocardiogram examinations, most                 recent 02/07/2022. CHF, CAD and Previous Myocardial Infarction,                 Arrythmias:PVC, Signs/Symptoms:Chest Pain; Risk                 Factors:Hypertension, Diabetes and Dyslipidemia. Patient on                 Bi-Pap.  Sonographer:    Wenda Low Referring Phys: [phone] Nadean Corwin HILTY  Sonographer Comments: Patient is morbidly obese. IMPRESSIONS  1. Limited echo for LVEF and regional wall motion.  2. No apical thrombus by Definity contrast. Left ventricular ejection fraction, by estimation, is 35 to 40%. Left ventricular ejection fraction by 2D MOD biplane is 36.6 %. The left ventricle has moderately decreased function. The left ventricle demonstrates severe global hypokinesis, worse at the apex. Regional wall motion is  difficult to ascertain given frequent PVC's.  3. The inferior vena cava is dilated in size with <50% respiratory variability, suggesting right atrial pressure of 15 mmHg.  4. Right ventricular systolic function was not well visualized. The right ventricular size is not well visualized.  5. Rhythm strip during this exam demonstrates atrial fibrillation. Comparison(s): Changes from prior study are noted. 02/07/2022: LVEF 60-65%, no regional WMA's. FINDINGS  Left Ventricle: No apical thrombus by Definity contrast. Left ventricular ejection fraction, by estimation, is 35 to 40%. Left ventricular ejection fraction by 2D MOD biplane is 36.6 %. The left ventricle has moderately decreased function. The left ventricle demonstrates global hypokinesis. Definity contrast agent was given IV to delineate the left ventricular endocardial borders. The left ventricular internal cavity size was normal in size. There is moderate left ventricular hypertrophy. Right Ventricle: The right ventricular size is not well visualized. Right vetricular wall thickness was not well visualized. Right ventricular systolic function was not well visualized. Aorta: The aortic root and ascending aorta are structurally normal, with no evidence of dilitation. Venous: The inferior vena cava  is dilated in size with less than 50% respiratory variability, suggesting right atrial pressure of 15 mmHg. EKG: Rhythm strip during this exam demonstrates atrial fibrillation. LEFT VENTRICLE PLAX 2D                        Biplane EF (MOD) LVIDd:         6.80 cm         LV Biplane EF:   Left LVIDs:         4.60 cm                          ventricular LV PW:         1.40 cm                          ejection LV IVS:        1.50 cm                          fraction by LVOT diam:     2.00 cm                          2D MOD LVOT Area:     3.14 cm                         biplane is                                                 36.6 %.  LV Volumes (MOD) LV vol d, MOD     122.0 ml A2C: LV vol d, MOD    128.0 ml A4C: LV vol s, MOD    74.4 ml A2C: LV vol s, MOD    89.6 ml A4C: LV SV MOD A2C:   47.6 ml LV SV MOD A4C:   128.0 ml LV SV MOD BP:    49.2 ml LEFT ATRIUM         Index LA diam:    5.50 cm 2.22 cm/m   AORTA Ao Root diam: 3.30 cm  SHUNTS Systemic Diam: 2.00 cm Lyman Bishop MD Electronically signed by Lyman Bishop MD Signature Date/Time: 07/29/2022/4:28:09 PM    Final    DG Chest Portable 1 View  Result Date: 07/28/2022 CLINICAL DATA:  Shortness of breath, chest pain EXAM: PORTABLE CHEST 1 VIEW COMPARISON:  05/29/2022 FINDINGS: Cardiomegaly, vascular congestion. No confluent opacities, effusions or edema. No acute bony abnormality. IMPRESSION: Cardiomegaly, vascular congestion. Electronically Signed   By: Rolm Baptise M.D.   On: 07/28/2022 23:46    Orson Eva, DO  [facility]  If 7PM-7AM, please contact night-coverage [email or web address] Password [email or web address] 07/31/2022, 2:45 PM   LOS: 2 days

## 2022-07-31 NOTE — Progress Notes (Signed)
Patient extubated per MD order.  Patient placed on 4L nasal cannula and tolerating well at this time.  O2 sat 100%

## 2022-07-31 NOTE — Progress Notes (Signed)
Tube advanced to 27cm @ lip

## 2022-07-31 NOTE — Progress Notes (Addendum)
Progress Note  Patient Name: [name] Date of Encounter: 07/31/2022  [facility] Cardiologist: Carlyle Dolly, MD   Subjective   Intubated but awake on the vent. In talking with the patient's nurse, Pulmonology is planning for possible extubation today.   Inpatient Medications    Scheduled Meds:  allopurinol  100 mg Per Tube Daily   aspirin  81 mg Per Tube Daily   carvedilol  6.25 mg Per Tube BID WC   Chlorhexidine Gluconate Cloth  6 each Topical Q0600   cloNIDine  0.1 mg Per Tube BID   darbepoetin (ARANESP) injection - NON-DIALYSIS  200 mcg Subcutaneous Q Tue-1800   docusate  100 mg Per Tube BID   feeding supplement (VITAL HIGH PROTEIN)  1,000 mL Per Tube Q24H   heparin  5,000 Units Subcutaneous Q8H   hydrALAZINE  50 mg Per Tube TID   insulin aspart  0-9 Units Subcutaneous Q4H   isosorbide dinitrate  40 mg Per Tube TID   mouth rinse  15 mL Mouth Rinse Q2H   pantoprazole sodium  40 mg Per Tube Daily   polyethylene glycol  17 g Per Tube Daily   pravastatin  40 mg Per Tube QPM   sodium chloride flush  3 mL Intravenous Q12H   Continuous Infusions:  sodium chloride     dexmedetomidine (PRECEDEX) IV infusion Stopped (07/29/22 2143)   fentaNYL infusion INTRAVENOUS 175 mcg/hr (07/30/22 2353)   ferric gluconate (FERRLECIT) IVPB Stopped (07/30/22 1107)   furosemide 160 mg (07/31/22 0202)   PRN Meds: sodium chloride, acetaminophen, fentaNYL, fentaNYL (SUBLIMAZE) injection, midazolam, ondansetron (ZOFRAN) IV, mouth rinse, sodium chloride flush   Vital Signs    Vitals:   07/31/22 0336 07/31/22 0345 07/31/22 0400 07/31/22 0500  BP:  (!) 195/128 (!) 189/46   Pulse:  92 88   Resp:  15 18   Temp:      TempSrc:      SpO2: 100% 100% 100%   Weight:    119.7 kg  Height:        Intake/Output Summary (Last 24 hours) at 07/31/2022 0736 Last data filed at 07/31/2022 0600 Gross per 24 hour  Intake 1890.53 ml  Output 6850 ml  Net -4959.47 ml      07/31/2022    5:00 AM  07/30/2022    5:00 AM 07/29/2022    8:52 AM  Last 3 Weights  Weight (lbs) 263 lb 14.3 oz 263 lb 14.3 oz 270 lb 11.6 oz  Weight (kg) 119.7 kg 119.7 kg 122.8 kg      Telemetry    NSR, HR in 60's to 70's with 1st degree AV Block. Frequent PVC's and occasional episodes of ventricular bigeminy and trigeminy.  - Personally Reviewed  ECG    No new tracings.   Physical Exam   GEN: Elderly male, currently in no acute distress.   Neck: JVD appears elevated to jaw.  Cardiac: RRR with ectopic beats, no murmurs, rubs, or gallops.  Respiratory: Decreased breath sounds. Intubated.  GI: Soft, nontender, non-distended  MS: 1+ pitting edema bilaterally but improved from prior examinations; No deformity. Neuro:  Nonfocal  Psych: Awake but currently intubated.  Labs    High Sensitivity Troponin:   Recent Labs  Lab 07/28/22 2328 07/29/22 0213  TROPONINIHS 42* 41*     Chemistry Recent Labs  Lab 07/28/22 2328 07/29/22 0213 07/29/22 2049 07/30/22 0331 07/31/22 0433  NA 137  --  141 143 142  K 3.2*  --  3.6  3.6 3.7 3.5  CL 103  --  105 104 101  CO2 23  --  26 27 28   GLUCOSE 170*  --  148* 145* 180*  BUN 74*  --  79* 79* 92*  CREATININE 3.38*  --  3.34* 3.28* 3.09*  CALCIUM 8.9  --  8.5* 8.9 9.5  MG  --    < > 2.2 2.1 2.1  PROT 7.6  --   --   --   --   ALBUMIN 4.2  --   --   --   --   AST 41  --   --   --   --   ALT 39  --   --   --   --   ALKPHOS 59  --   --   --   --   BILITOT 0.4  --   --   --   --   GFRNONAA 18*  --  18* 19* 20*  ANIONGAP 11  --  10 12 13    < > = values in this interval not displayed.    Lipids No results for input(s): "CHOL", "TRIG", "HDL", "LABVLDL", "LDLCALC", "CHOLHDL" in the last 168 hours.  Hematology Recent Labs  Lab 07/28/22 2328 07/30/22 0331  WBC 11.6* 10.4  RBC 2.57* 2.51*  HGB 8.2* 8.0*  HCT 25.0* 24.8*  MCV 97.3 98.8  MCH 31.9 31.9  MCHC 32.8 32.3  RDW 16.4* 16.5*  PLT 227 186   Thyroid No results for input(s): "TSH", "FREET4" in  the last 168 hours.  BNP Recent Labs  Lab 07/28/22 2338  BNP 651.0*    DDimer No results for input(s): "DDIMER" in the last 168 hours.   Radiology    DG CHEST PORT 1 VIEW  Result Date: 07/31/2022 CLINICAL DATA:  76 year old male status post endotracheal tube placement. Heart failure. EXAM: PORTABLE CHEST 1 VIEW COMPARISON:  Chest x-ray 07/31/2022. FINDINGS: An endotracheal tube is in place with tip 5.3 cm above the carina. A nasogastric tube is seen extending into the stomach, however, the tip of the nasogastric tube extends below the lower margin of the image. Lung volumes are low. Bibasilar opacities may reflect areas of atelectasis and/or consolidation, potentially with superimposed small left pleural effusion. No definite right pleural effusion. Ill-defined opacities and areas of interstitial prominence are also noted throughout the mid to lower lungs bilaterally. Crowding and cephalization of the pulmonary vasculature. No pneumothorax. Heart size is mildly enlarged. Upper mediastinal contours are within normal limits. IMPRESSION: 1. Support apparatus, as above. 2. The appearance the chest may suggest mild congestive heart failure. In addition, there are bibasilar opacities favored to reflect areas of subsegmental atelectasis, with superimposed small left pleural effusion. Electronically Signed   By: Vinnie Langton M.D.   On: 07/31/2022 05:18   DG Chest Portable 1 View  Result Date: 07/31/2022 CLINICAL DATA:  Endotracheal tube malposition EXAM: PORTABLE CHEST 1 VIEW COMPARISON:  None Available. FINDINGS: The endotracheal tube is seen above the level of the clavicular heads 10.9 cm above the carina. The balloon appears hyperinflated within the hypopharynx. Nasogastric tube extends into the upper abdomen beyond the margin of the examination. Pulmonary insufflation is normal and stable since prior examination. No pneumothorax or pleural effusion. Mild cardiomegaly is stable. Superimposed bilateral  perihilar pulmonary infiltrates appear improved in the interval since prior examination, infection versus edema. IMPRESSION: 1. High position of the endotracheal tube. The balloon appears hyperinflated within the hypopharynx. Advancement of the  endotracheal tube by 5-6 cm would better position the device. 2. Improving bilateral perihilar pulmonary infiltrates, infection versus edema. 3. Stable cardiomegaly. Electronically Signed   By: Fidela Salisbury M.D.   On: 07/31/2022 04:37   DG Chest Port 1 View  Addendum Date: 07/30/2022   ADDENDUM REPORT: 07/30/2022 08:00 ADDENDUM: Additional image done at 3:30 p.m. on 07/29/2022 is submitted for review. Tip of NG tube is seen in the fundus of the stomach. Side port an NG tube is noted close to the gastroesophageal junction. Electronically Signed   By: Elmer Picker M.D.   On: 07/30/2022 08:00   Result Date: 07/30/2022 CLINICAL DATA:  Placement of endotracheal tube and orogastric tube EXAM: PORTABLE CHEST 1 VIEW COMPARISON:  Previous studies including the examination of 07/28/2022 FINDINGS: Transverse diameter heart is increased. There is interval worsening of pulmonary vascular congestion. Interval appearance of extensive alveolar densities are seen in the parahilar regions and lower lung fields, more so on the left side. Lateral CP angles are indistinct. There is no pneumothorax. Tip of endotracheal tube is 6.6 cm above the carina. NG tube is not visualized in chest. IMPRESSION: Cardiomegaly. There is interval worsening of pulmonary vascular congestion. Extensive alveolar infiltrates are seen in both lungs suggesting pulmonary edema or multifocal pneumonia with interval worsening. NG tube is not visualized and may be coiled within the mouth or oropharynx. Electronically Signed: By: Elmer Picker M.D. On: 07/29/2022 15:46   DG Chest Port 1 View  Result Date: 07/30/2022 CLINICAL DATA:  Chest pain and dyspnea. EXAM: PORTABLE CHEST 1 VIEW COMPARISON:  Portable  chest 07/29/2022 FINDINGS: 4:55 a.m. ETT tip is 6.2 cm from the carina. Interval insertion NGT with the tip in the body of stomach. There is spinal stimulator wiring in the distal thoracic spinal canal. The heart is enlarged. The aorta is tortuous and ectatic with stable mediastinum. Aortic atherosclerosis. There is persistent perihilar vascular congestion and extensive bilateral perihilar infiltrates, more confluent on the left, with bilateral peripheral sparing and small pleural effusions. No new or worsening opacities seen. Overall aeration is generally stable. IMPRESSION: Persistent left-greater-than-right perihilar infiltrates with cardiomegaly and central vascular engorgement. Findings could be due to alveolar pulmonary edema, pneumonia or combination. Stable overall aeration noted today with no new or worsening infiltrates. Small pleural effusions appear similar. NGT tip in the body of stomach with stable ETT positioning. Aortic atherosclerosis. Electronically Signed   By: Telford Nab M.D.   On: 07/30/2022 07:21   Cardiac Studies   Limited Echo: 07/29/2022 IMPRESSIONS     1. Limited echo for LVEF and regional wall motion.   2. No apical thrombus by Definity contrast. Left ventricular ejection  fraction, by estimation, is 35 to 40%. Left ventricular ejection fraction  by 2D MOD biplane is 36.6 %. The left ventricle has moderately decreased  function. The left ventricle  demonstrates severe global hypokinesis, worse at the apex. Regional wall  motion is difficult to ascertain given frequent PVC's.   3. The inferior vena cava is dilated in size with <50% respiratory  variability, suggesting right atrial pressure of 15 mmHg.   4. Right ventricular systolic function was not well visualized. The right  ventricular size is not well visualized.   5. Rhythm strip during this exam demonstrates atrial fibrillation.   Comparison(s): Changes from prior study are noted. 02/07/2022: LVEF 60-65%,  no  regional WMA's.   Patient Profile     76 y.o. male w/ PMH of CAD (s/p DES to LAD and  D1 in 10/2012 with nonobstructive disease along RCA and LCx, low-risk NST in 04/2022), HFpEF, HTN, HLD and Stage 4 CKD who is currently admitted for an acute CHF exacerbation.  Assessment & Plan    1. HFrEF/Acute Hypoxic Respiratory Failure - Currently admitted for an acute CHF exacerbation and repeat limited echo shows a newly reduced EF of 35-40% with severe global hypokinesis. Possibly rate-related but cannot rule-out an ischemic cardiomyopathy given his history of CAD. - Required intubation on 8/7 due to failing BiPAP and he is responding well to IV Lasix 160 mg Q6H with a net output of -10.0 L thus far (net of -4.9 yesterday). Diuretic dosing has been deferred to Nephrology given his advanced CKD. Weight recorded at 263 lbs which is below his prior baseline of 272 lbs.  - For his cardiomyopathy, he is currently receiving Coreg, Hydralazine and Isordil. Will titrate Hydralazine as outlined below. Not a candidate for ACE-I/ARB/ARNI/Spiro/SGLT2 inhibitor given his renal function. Not a catheterization candidate at this time either given his CKD.    2. CAD/Chest Pain - He is s/p DES to LAD and D1 in 10/2012 with nonobstructive disease along RCA and LCx. Most recent ischemic evaluation was a low-risk NST in 04/2022. - Hs Troponin values have been flat at 42 and 41 which are most consistent with demand ischemia but his echocardiogram does show a new cardiomyopathy with EF at 35-40%. Would not anticipate a catheterization this admission unless he requires initiation of HD given his high-risk for contrast-induced nephropathy.  - Continue medical therapy with ASA, Coreg 6.3m BID and Pravastatin 474mdaily.    3. Paroxysmal Atrial Fibrillation - Documented on the day of admission but by review of telemetry, he has been in NSR with 1st degree AV block and frequent ectopy. Also on p-waves on echo images from 8/7.  Remains on Coreg.  - His CHA2DS2-VASc Score is at least 6. Has not yet been started on anticoagulation, likely due to his chronic anemia (Hgb at 8.0 on 8/9). Will add-on a repeat CBC to AM labs as he did receive iron supplementation yesterday. Unless noted to have definitive atrial fibrillation, would prefer not to commit to long-term anticoagulation.    4. HTN - BP has been significantly variable, from 103/46 - 189/46 within the past 24 hours. Hydralazine and Clonidine were reduced yesterday with Amlodipine being held due to hypotension the night prior. Will titrate Hydralazine to 7589mID and can further increase to 100m31mD pending BP trend. Also remains on Coreg 6.25mg59m, Clonidine 0.1mg B20m Hydralazine 50mg T44mnd Isordil 40mg TI52m  5. HLD - His LDL was at 46 in 04/2022. On Pravastatin 40mg dai76mWould consider switching to high-intensity statin prior to discharge (listed as having an allergy to Niacin but no listed allergy to statins).    6. Stage 4 CKD - Nephrology following. Creatinine continues to improve with diuresis. Baseline aroud 2.8 - 3.0 and elevated at 3.38 on admission, at 3.09 this AM.   For questions or updates, please contact [facility] [email or web address] for contact info under        Signed, Mesha Schamberger Erma Heritage/08/2022, 7:36 AM

## 2022-08-01 DIAGNOSIS — N184 Chronic kidney disease, stage 4 (severe): Secondary | ICD-10-CM | POA: Diagnosis not present

## 2022-08-01 DIAGNOSIS — I5031 Acute diastolic (congestive) heart failure: Secondary | ICD-10-CM

## 2022-08-01 DIAGNOSIS — J9601 Acute respiratory failure with hypoxia: Secondary | ICD-10-CM | POA: Diagnosis not present

## 2022-08-01 DIAGNOSIS — R778 Other specified abnormalities of plasma proteins: Secondary | ICD-10-CM | POA: Diagnosis not present

## 2022-08-01 LAB — URINALYSIS, COMPLETE (UACMP) WITH MICROSCOPIC
Bilirubin Urine: NEGATIVE
Glucose, UA: NEGATIVE mg/dL
Hgb urine dipstick: NEGATIVE
Ketones, ur: NEGATIVE mg/dL
Leukocytes,Ua: NEGATIVE
Nitrite: NEGATIVE
Protein, ur: 30 mg/dL — AB
Specific Gravity, Urine: 1.011 (ref 1.005–1.030)
pH: 5 (ref 5.0–8.0)

## 2022-08-01 LAB — BASIC METABOLIC PANEL
Anion gap: 14 (ref 5–15)
BUN: 100 mg/dL — ABNORMAL HIGH (ref 8–23)
CO2: 29 mmol/L (ref 22–32)
Calcium: 9.2 mg/dL (ref 8.9–10.3)
Chloride: 98 mmol/L (ref 98–111)
Creatinine, Ser: 3.36 mg/dL — ABNORMAL HIGH (ref 0.61–1.24)
GFR, Estimated: 18 mL/min — ABNORMAL LOW (ref >60)
Glucose, Bld: 156 mg/dL — ABNORMAL HIGH (ref 70–99)
Potassium: 3.3 mmol/L — ABNORMAL LOW (ref 3.5–5.1)
Sodium: 141 mmol/L (ref 135–145)

## 2022-08-01 LAB — GLUCOSE, CAPILLARY
Glucose-Capillary: 161 mg/dL — ABNORMAL HIGH (ref 70–99)
Glucose-Capillary: 153 mg/dL — ABNORMAL HIGH (ref 70–99)
Glucose-Capillary: 161 mg/dL — ABNORMAL HIGH (ref 70–99)
Glucose-Capillary: 165 mg/dL — ABNORMAL HIGH (ref 70–99)

## 2022-08-01 LAB — PROCALCITONIN: Procalcitonin: 24.31 ng/mL

## 2022-08-01 LAB — MAGNESIUM: Magnesium: 2.1 mg/dL (ref 1.7–2.4)

## 2022-08-01 MED ORDER — POTASSIUM CHLORIDE CRYS ER 20 MEQ PO TBCR
40.0000 meq | EXTENDED_RELEASE_TABLET | Freq: Four times a day (QID) | ORAL | Status: AC
  Administered 2022-08-01: 40 meq via ORAL
  Filled 2022-08-01 (×2): qty 2

## 2022-08-01 MED ORDER — SODIUM CHLORIDE 0.9 % IV SOLN
1.0000 g | Freq: Two times a day (BID) | INTRAVENOUS | Status: DC
  Administered 2022-08-01 – 2022-08-03 (×4): 1 g via INTRAVENOUS
  Filled 2022-08-01 (×4): qty 20

## 2022-08-01 MED ORDER — ENSURE ENLIVE PO LIQD
237.0000 mL | Freq: Two times a day (BID) | ORAL | Status: DC
  Administered 2022-08-02 – 2022-08-10 (×12): 237 mL via ORAL

## 2022-08-01 NOTE — Progress Notes (Signed)
Breathing stable and on minimal oxygen support.    PCCM will sign off.  Please call if additional help needed while he is in hospital.  Chesley Mires, MD [place] Pager - [phone] 08/01/2022, 11:32 AM

## 2022-08-01 NOTE — Progress Notes (Addendum)
Nutrition Follow-up  DOCUMENTATION CODES:   Obesity unspecified  INTERVENTION:  Ensure Enlive daily   Encourage meal and supplement intake  NUTRITION DIAGNOSIS:   Inadequate oral intake related to inability to eat as evidenced by NPO status  -pt weaned from vent and diet advanced   GOAL:  Pt to meet >/= 90% of their estimated nutrition needs    -progressing  MONITOR:   Po intake, labs and wt trends   REASON FOR ASSESSMENT:  Change in status    ASSESSMENT:  Acute heart failure exacerbation and atrial fibrillation, acute respiratory failure with hypoxia.   Patient extubated yesterday and diet fully advanced. Today's intake minimal. Talked with nursing who reports patient having nausea. Anti-nausea medication provided. He is resting during RD visit and his "lady friend" is bedside.   Weight-8/6) 126.4 kg -> 8/10) 112.3 kg . Mild to moderate edema upper and lower extremities. Patient nutrition needs re-assessed with change in status.    Intake/Output Summary (Last 24 hours) at 08/01/2022 1625 Last data filed at 08/01/2022 1234 Gross per 24 hour  Intake 912.55 ml  Output 2375 ml  Net -1462.45 ml  Since admission- (-13.29 Liters)  Medications: insulin, Protonix, Klor-con, allopurinol.      Latest Ref Rng & Units 08/01/2022    3:49 AM 07/31/2022    4:33 AM 07/30/2022    3:31 AM  BMP  Glucose 70 - 99 mg/dL 156  180  145   BUN 8 - 23 mg/dL 100  92  79   Creatinine 0.61 - 1.24 mg/dL 3.36  3.09  3.28   Sodium 135 - 145 mmol/L 141  142  143   Potassium 3.5 - 5.1 mmol/L 3.3  3.5  3.7   Chloride 98 - 111 mmol/L 98  101  104   CO2 22 - 32 mmol/L 29  28  27    Calcium 8.9 - 10.3 mg/dL 9.2  9.5  8.9     Diet Order:   Diet Order             Diet renal/carb modified with fluid restriction Diet-HS Snack? Nothing; Fluid restriction: 1200 mL Fluid; Room service appropriate? Yes; Fluid consistency: Thin  Diet effective 1400                   EDUCATION NEEDS:   Not  appropriate for education at this time  Skin:  Skin Assessment: Skin Integrity Issues: Skin Integrity Issues::  (7/20- venous statis ulcer)  Last BM:  8/8 type 6  Height:   Ht Readings from Last 1 Encounters:  07/29/22 6' 2"  (1.88 m)    Weight:   Wt Readings from Last 1 Encounters:  08/01/22 112.3 kg    Ideal Body Weight:   86 kg  BMI:  Body mass index is 31.79 kg/m.  Estimated Nutritional Needs:   Kcal:  2100-2300  Protein:  145-154 gr  Fluid:  < 2 liters daily  Colman Cater MS,RD,CSG,LDN Contact: Shea Evans

## 2022-08-01 NOTE — Progress Notes (Signed)
Progress Note  Patient Name: [name] Date of Encounter: 08/01/2022  [facility] Cardiologist: Carlyle Dolly, MD   Subjective   Breathing is improving.   Inpatient Medications    Scheduled Meds:  allopurinol  100 mg Oral Daily   aspirin  81 mg Oral Daily   atorvastatin  80 mg Oral Daily   carvedilol  6.25 mg Oral BID WC   Chlorhexidine Gluconate Cloth  6 each Topical Q0600   cloNIDine  0.1 mg Oral BID   darbepoetin (ARANESP) injection - NON-DIALYSIS  200 mcg Subcutaneous Q Tue-1800   heparin  5,000 Units Subcutaneous Q8H   hydrALAZINE  100 mg Oral TID   insulin aspart  0-5 Units Subcutaneous QHS   insulin aspart  0-9 Units Subcutaneous TID WC   isosorbide mononitrate  120 mg Oral Daily   pantoprazole  40 mg Oral Daily   sodium chloride flush  3 mL Intravenous Q12H   Continuous Infusions:  sodium chloride     ferric gluconate (FERRLECIT) IVPB Stopped (07/31/22 1322)   furosemide Stopped (07/31/22 2234)   PRN Meds: sodium chloride, acetaminophen, ondansetron (ZOFRAN) IV, mouth rinse, polyethylene glycol, sodium chloride flush   Vital Signs    Vitals:   08/01/22 0632 08/01/22 0700 08/01/22 0749 08/01/22 0800  BP:  (!) 111/43  130/66  Pulse:  81    Resp:  15 13   Temp: 100.1 F (37.8 C)  100.2 F (37.9 C)   TempSrc: Oral  Oral   SpO2:  100%    Weight: 112.3 kg     Height:        Intake/Output Summary (Last 24 hours) at 08/01/2022 0821 Last data filed at 08/01/2022 0617 Gross per 24 hour  Intake 973.55 ml  Output 3300 ml  Net -2326.45 ml      08/01/2022    6:32 AM 07/31/2022    5:00 AM 07/30/2022    5:00 AM  Last 3 Weights  Weight (lbs) 247 lb 9.2 oz 263 lb 14.3 oz 263 lb 14.3 oz  Weight (kg) 112.3 kg 119.7 kg 119.7 kg      Telemetry    SR, wenchebach block - Personally Reviewed  ECG    N/a - Personally Reviewed  Physical Exam   GEN: No acute distress.   Neck: No JVD Cardiac: RRR, no murmurs, rubs, or gallops.  Respiratory: mild  crackles bases bilaterally GI: Soft, nontender, non-distended  MS: trace bilateral edema Neuro:  Nonfocal  Psych: Normal affect   Labs    High Sensitivity Troponin:   Recent Labs  Lab 07/28/22 2328 07/29/22 0213  TROPONINIHS 42* 41*     Chemistry Recent Labs  Lab 07/28/22 2328 07/29/22 0213 07/30/22 0331 07/31/22 0433 08/01/22 0349  NA 137   < > 143 142 141  K 3.2*   < > 3.7 3.5 3.3*  CL 103   < > 104 101 98  CO2 23   < > 27 28 29   GLUCOSE 170*   < > 145* 180* 156*  BUN 74*   < > 79* 92* 100*  CREATININE 3.38*   < > 3.28* 3.09* 3.36*  CALCIUM 8.9   < > 8.9 9.5 9.2  MG  --    < > 2.1 2.1 2.1  PROT 7.6  --   --   --   --   ALBUMIN 4.2  --   --   --   --   AST 41  --   --   --   --  ALT 39  --   --   --   --   ALKPHOS 59  --   --   --   --   BILITOT 0.4  --   --   --   --   GFRNONAA 18*   < > 19* 20* 18*  ANIONGAP 11   < > 12 13 14    < > = values in this interval not displayed.    Lipids No results for input(s): "CHOL", "TRIG", "HDL", "LABVLDL", "LDLCALC", "CHOLHDL" in the last 168 hours.  Hematology Recent Labs  Lab 07/28/22 2328 07/30/22 0331 07/31/22 0433  WBC 11.6* 10.4 11.0*  RBC 2.57* 2.51* 3.07*  HGB 8.2* 8.0* 9.6*  HCT 25.0* 24.8* 30.5*  MCV 97.3 98.8 99.3  MCH 31.9 31.9 31.3  MCHC 32.8 32.3 31.5  RDW 16.4* 16.5* 16.9*  PLT 227 186 227   Thyroid No results for input(s): "TSH", "FREET4" in the last 168 hours.  BNP Recent Labs  Lab 07/28/22 2338  BNP 651.0*    DDimer No results for input(s): "DDIMER" in the last 168 hours.   Radiology    DG CHEST PORT 1 VIEW  Result Date: 07/31/2022 CLINICAL DATA:  76 year old male status post endotracheal tube placement. Heart failure. EXAM: PORTABLE CHEST 1 VIEW COMPARISON:  Chest x-ray 07/31/2022. FINDINGS: An endotracheal tube is in place with tip 5.3 cm above the carina. A nasogastric tube is seen extending into the stomach, however, the tip of the nasogastric tube extends below the lower margin of the  image. Lung volumes are low. Bibasilar opacities may reflect areas of atelectasis and/or consolidation, potentially with superimposed small left pleural effusion. No definite right pleural effusion. Ill-defined opacities and areas of interstitial prominence are also noted throughout the mid to lower lungs bilaterally. Crowding and cephalization of the pulmonary vasculature. No pneumothorax. Heart size is mildly enlarged. Upper mediastinal contours are within normal limits. IMPRESSION: 1. Support apparatus, as above. 2. The appearance the chest may suggest mild congestive heart failure. In addition, there are bibasilar opacities favored to reflect areas of subsegmental atelectasis, with superimposed small left pleural effusion. Electronically Signed   By: Vinnie Langton M.D.   On: 07/31/2022 05:18   DG Chest Portable 1 View  Result Date: 07/31/2022 CLINICAL DATA:  Endotracheal tube malposition EXAM: PORTABLE CHEST 1 VIEW COMPARISON:  None Available. FINDINGS: The endotracheal tube is seen above the level of the clavicular heads 10.9 cm above the carina. The balloon appears hyperinflated within the hypopharynx. Nasogastric tube extends into the upper abdomen beyond the margin of the examination. Pulmonary insufflation is normal and stable since prior examination. No pneumothorax or pleural effusion. Mild cardiomegaly is stable. Superimposed bilateral perihilar pulmonary infiltrates appear improved in the interval since prior examination, infection versus edema. IMPRESSION: 1. High position of the endotracheal tube. The balloon appears hyperinflated within the hypopharynx. Advancement of the endotracheal tube by 5-6 cm would better position the device. 2. Improving bilateral perihilar pulmonary infiltrates, infection versus edema. 3. Stable cardiomegaly. Electronically Signed   By: Fidela Salisbury M.D.   On: 07/31/2022 04:37    Cardiac Studies    Patient Profile     76 y.o. male w/ PMH of CAD (s/p DES to LAD  and D1 in 10/2012 with nonobstructive disease along RCA and LCx, low-risk NST in 04/2022), HFpEF, HTN, HLD and Stage 4 CKD who is currently admitted for an acute CHF exacerbation.  Assessment & Plan    1.Acute HFrEF - 01/2022 echo :  LVEF 60-65% - 07/2022 echo LVEF 35-40% - initially bipap, later intubated. Extubated 07/31/22  - neg 2.8 L yesterday, neg 12.8 L since admission. He is in IV lasix 150m bid, diuretics per neprhology. Cr range 3 to 3.3 - medical therapy limited due to renal dysfunction. He is on coreg 6.211mbid, hydral 10023mid, imdur 120 - poor cath candidate at this time given renal function. Myoview 04/2022 showed inferior infarct and small apical infarct with mild peri-infarct ischemia  2. CAD - He is s/p DES to LAD and D1 in 10/2012 with nonobstructive disease along RCA and LCx. 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  - no chest pains this admit  3.CKD IV - per neprhology  4. First degree AV block/Wenchebach - monitor while on beta blocker - have not seen afib   For questions or updates, please contact [facility] Please consult [email or web address] for contact info under        Signed, BraCarlyle DollyD  08/01/2022, 8:21 AM

## 2022-08-01 NOTE — Progress Notes (Signed)
Pharmacy Antibiotic Note  [name] is a 76 y.o. male admitted on 07/28/2022 with sepsis.  Pharmacy has been consulted for meropenem dosing.  Plan: Meropenem 1000 mg IV every 12 hours.  Height: 6' 2"  (188 cm) Weight: 112.3 kg (247 lb 9.2 oz) IBW/kg (Calculated) : 82.2  Temp (24hrs), Avg:99.9 F (37.7 C), Min:98.9 F (37.2 C), Max:101 F (38.3 C)  Recent Labs  Lab 07/28/22 2328 07/29/22 2049 07/30/22 0331 07/31/22 0433 08/01/22 0349  WBC 11.6*  --  10.4 11.0*  --   CREATININE 3.38* 3.34* 3.28* 3.09* 3.36*    Estimated Creatinine Clearance: 24.5 mL/min (A) (by C-G formula based on SCr of 3.36 mg/dL (H)).    Allergies  Allergen Reactions   Metformin Nausea And Vomiting   Niacin    Niacin And Related Other (See Comments)    Reaction: flushing and burning side effects even with extended release    Antimicrobials this admission: Merrem 8/10 >>  Microbiology results: 8/10 BCx: pending 8/10 UCx: pending  8/7 MRSA PCR: neg  Thank you for allowing pharmacy to be a part of this patient's care.  Ramond Craver 08/01/2022 4:24 PM

## 2022-08-01 NOTE — Plan of Care (Signed)
  Problem: Acute Rehab PT Goals(only PT should resolve) Goal: Pt Will Go Supine/Side To Sit Outcome: Progressing Flowsheets (Taken 08/01/2022 1458) Pt will go Supine/Side to Sit:  with minimal assist  with moderate assist Goal: Pt Will Go Sit To Supine/Side Outcome: Progressing Flowsheets (Taken 08/01/2022 1458) Pt will go Sit to Supine/Side:  with minimal assist  with moderate assist Goal: Patient Will Transfer Sit To/From Stand Outcome: Progressing Flowsheets (Taken 08/01/2022 1458) Patient will transfer sit to/from stand:  with minimal assist  with moderate assist Goal: Pt Will Transfer Bed To Chair/Chair To Bed Outcome: Progressing Flowsheets (Taken 08/01/2022 1458) Pt will Transfer Bed to Chair/Chair to Bed:  with min assist  with mod assist Goal: Pt Will Ambulate Outcome: Progressing Flowsheets (Taken 08/01/2022 1458) Pt will Ambulate:  10 feet  with moderate assist  with least restrictive assistive device Goal: Pt/caregiver will Perform Home Exercise Program Outcome: Progressing Flowsheets (Taken 08/01/2022 1458) Pt/caregiver will Perform Home Exercise Program:  For increased strengthening  For improved balance  With Supervision, verbal cues required/provided  2:58 PM, 08/01/22 Mearl Latin PT, DPT Physical Therapist at [facility]

## 2022-08-01 NOTE — Progress Notes (Signed)
Nephrology Progress Note:  Subjective:   He had 3.8 liters UOP over 8/9 charted.  has continued in the ICU.  He was extubated yesterday and was initially on 4 liters but now on 2 liters.  Post void bladder scan elevated at 190 ml today.  He follows with Dr. Theador Hawthorne and states that they have not discussed dialysis.  He is sleepy but participates in the conversation.    Review of systems:  Denies overt shortness of breath but still on oxygen  Nausea this am after potassium  No chest pain  Urinary Retention as above   Objective Vital signs in last 24 hours: Vitals:   08/01/22 0632 08/01/22 0700 08/01/22 0749 08/01/22 0800  BP:  (!) 111/43  130/66  Pulse:  81    Resp:  15 13   Temp: 100.1 F (37.8 C)  100.2 F (37.9 C)   TempSrc: Oral  Oral   SpO2:  100%    Weight: 112.3 kg     Height:       Weight change: -7.4 kg  Intake/Output Summary (Last 24 hours) at 08/01/2022 5631 Last data filed at 08/01/2022 0617 Gross per 24 hour  Intake 973.55 ml  Output 3300 ml  Net -2326.45 ml    Assessment/Plan: 76 year old BM with DM, HTN, diastolic heart failure,  CAD and CKD-  presenting with SOB and CP  1.AKI- advanced CKD at baseline.  He has had volume overload contributing to respiratory failure.  Post void bladder scan elevated at 190 ml today.  - There is no acute indication for dialysis.  - pause lasix after this am's dose - repeat post-void residual bladder scan and in/out cath for over 250 ml retained  - Per previous nephrology charting:  cardiology note says pt has refused dialysis -  not able to locate other documentation of this and patient new to me while intubated.  He was not asked by my colleague when he was in distress.   1a. CKD stage IV - baseline Cr is in the mid 3's recently  2. Hypertension/volume  - hypertensive and volume overloaded.  Volume status improving.   3. Anemia  - has been getting iron and ESA as outpatient.  S/p IV iron and on aranesp 200 mcg every Tuesday    4. Hypokalemia-  repleting   5. Acute systolic heart failure  cardiology following.  Medical management recommended at this time per charting. Optimize volume status as above  6. Acute hypoxic resp failure - now extubated. Optimize volume status above.   Disposition - continue inpatient monitoring   Labs: Basic Metabolic Panel: Recent Labs  Lab 07/30/22 0331 07/31/22 0433 08/01/22 0349  NA 143 142 141  K 3.7 3.5 3.3*  CL 104 101 98  CO2 27 28 29   GLUCOSE 145* 180* 156*  BUN 79* 92* 100*  CREATININE 3.28* 3.09* 3.36*  CALCIUM 8.9 9.5 9.2  PHOS  --  3.4  --    Liver Function Tests: Recent Labs  Lab 07/28/22 2328  AST 41  ALT 39  ALKPHOS 59  BILITOT 0.4  PROT 7.6  ALBUMIN 4.2   No results for input(s): "LIPASE", "AMYLASE" in the last 168 hours. No results for input(s): "AMMONIA" in the last 168 hours. CBC: Recent Labs  Lab 07/28/22 2328 07/30/22 0331 07/31/22 0433  WBC 11.6* 10.4 11.0*  NEUTROABS 9.0*  --   --   HGB 8.2* 8.0* 9.6*  HCT 25.0* 24.8* 30.5*  MCV 97.3 98.8 99.3  PLT 227 186 227   Cardiac Enzymes: No results for input(s): "CKTOTAL", "CKMB", "CKMBINDEX", "TROPONINI" in the last 168 hours. CBG: Recent Labs  Lab 07/31/22 0806 07/31/22 1140 07/31/22 1635 07/31/22 2127 08/01/22 0744  GLUCAP 150* 145* 139* 163* 161*    Iron Studies: No results for input(s): "IRON", "TIBC", "TRANSFERRIN", "FERRITIN" in the last 72 hours. Studies/Results: DG CHEST PORT 1 VIEW  Result Date: 07/31/2022 CLINICAL DATA:  76 year old male status post endotracheal tube placement. Heart failure. EXAM: PORTABLE CHEST 1 VIEW COMPARISON:  Chest x-ray 07/31/2022. FINDINGS: An endotracheal tube is in place with tip 5.3 cm above the carina. A nasogastric tube is seen extending into the stomach, however, the tip of the nasogastric tube extends below the lower margin of the image. Lung volumes are low. Bibasilar opacities may reflect areas of atelectasis and/or consolidation,  potentially with superimposed small left pleural effusion. No definite right pleural effusion. Ill-defined opacities and areas of interstitial prominence are also noted throughout the mid to lower lungs bilaterally. Crowding and cephalization of the pulmonary vasculature. No pneumothorax. Heart size is mildly enlarged. Upper mediastinal contours are within normal limits. IMPRESSION: 1. Support apparatus, as above. 2. The appearance the chest may suggest mild congestive heart failure. In addition, there are bibasilar opacities favored to reflect areas of subsegmental atelectasis, with superimposed small left pleural effusion. Electronically Signed   By: Vinnie Langton M.D.   On: 07/31/2022 05:18   DG Chest Portable 1 View  Result Date: 07/31/2022 CLINICAL DATA:  Endotracheal tube malposition EXAM: PORTABLE CHEST 1 VIEW COMPARISON:  None Available. FINDINGS: The endotracheal tube is seen above the level of the clavicular heads 10.9 cm above the carina. The balloon appears hyperinflated within the hypopharynx. Nasogastric tube extends into the upper abdomen beyond the margin of the examination. Pulmonary insufflation is normal and stable since prior examination. No pneumothorax or pleural effusion. Mild cardiomegaly is stable. Superimposed bilateral perihilar pulmonary infiltrates appear improved in the interval since prior examination, infection versus edema. IMPRESSION: 1. High position of the endotracheal tube. The balloon appears hyperinflated within the hypopharynx. Advancement of the endotracheal tube by 5-6 cm would better position the device. 2. Improving bilateral perihilar pulmonary infiltrates, infection versus edema. 3. Stable cardiomegaly. Electronically Signed   By: Fidela Salisbury M.D.   On: 07/31/2022 04:37   Medications: Infusions:  sodium chloride     ferric gluconate (FERRLECIT) IVPB Stopped (07/31/22 1322)   furosemide 120 mg (08/01/22 0910)    Scheduled Medications:  allopurinol  100  mg Oral Daily   aspirin  81 mg Oral Daily   atorvastatin  80 mg Oral Daily   carvedilol  6.25 mg Oral BID WC   Chlorhexidine Gluconate Cloth  6 each Topical Q0600   cloNIDine  0.1 mg Oral BID   darbepoetin (ARANESP) injection - NON-DIALYSIS  200 mcg Subcutaneous Q Tue-1800   heparin  5,000 Units Subcutaneous Q8H   hydrALAZINE  100 mg Oral TID   insulin aspart  0-5 Units Subcutaneous QHS   insulin aspart  0-9 Units Subcutaneous TID WC   isosorbide mononitrate  120 mg Oral Daily   pantoprazole  40 mg Oral Daily   potassium chloride  40 mEq Oral Q6H   sodium chloride flush  3 mL Intravenous Q12H    have reviewed scheduled and prn medications.  Physical exam:   General adult male in bed in no acute distress HEENT normocephalic atraumatic extraocular movements intact sclera anicteric Neck supple trachea midline Lungs clear but  reduced breath sounds; on 2 liters and lying flat for personal care Heart S1S2 no rub Abdomen soft nontender nondistended Extremities trace to 1+ edema present but improved; chronic trophic changes Neuro - awake and interactive GU - external catheter in place   Claudia Desanctis, MD 08/01/2022 9:41 AM  LOS: 3 days

## 2022-08-01 NOTE — Evaluation (Signed)
Physical Therapy Evaluation Patient Details Name: [name] MRN: [id number] DOB: 27-Jan-1946 Today's Date: 08/01/2022  History of Present Illness  [name] is a 76 y.o. male with medical history significant of hypertension, hyperlipidemia, diastolic heart failure, CAD s/p stent, CKD stage IV, and obesity who presents with complaints of shortness of breath and chest pain starting 2 days ago.  Chest pain is substernal and feels like pressure. He chronically has orthopnea and has a bed that he always sleeps sitting up.  He has been feeling short of breath even when he is not doing anything which is not normal for him.  Denies having any fever, nausea, vomiting, diarrhea, abdominal pain, or diaphoresis.   Clinical Impression  Patient limited for functional mobility as stated below secondary to BLE weakness, fatigue and poor sitting balance. Patient demonstrating generalized weakness with bilateral LE and UE in supine. He requires mod/max assist to transition to seated EOB. Patient with painful bilateral UE with mobility but after assist he is able to move them some with cueing. Patient demonstrates poor/fair sitting balance EOB and requires intermittent assist/cueing for sitting balance. He is limited by fatigue and is assisted back to supine at EOS with family present. Patient will benefit from continued physical therapy in hospital and recommended venue below to increase strength, balance, endurance for safe ADLs and gait.        Recommendations for follow up therapy are one component of a multi-disciplinary discharge planning process, led by the attending physician.  Recommendations may be updated based on patient status, additional functional criteria and insurance authorization.  Follow Up Recommendations Skilled nursing-short term rehab (<3 hours/day) Can patient physically be transported by private vehicle: No    Assistance Recommended at Discharge Frequent or constant  Supervision/Assistance  Patient can return home with the following  A lot of help with walking and/or transfers;A lot of help with bathing/dressing/bathroom    Equipment Recommendations None recommended by PT  Recommendations for Other Services       Functional Status Assessment Patient has had a recent decline in their functional status and demonstrates the ability to make significant improvements in function in a reasonable and predictable amount of time.     Precautions / Restrictions Precautions Precautions: Fall Restrictions Weight Bearing Restrictions: No      Mobility  Bed Mobility Overal bed mobility: Needs Assistance Bed Mobility: Supine to Sit, Sit to Supine     Supine to sit: Mod assist, Max assist, HOB elevated Sit to supine: Max assist   General bed mobility comments: labored, cueing required    Transfers                        Ambulation/Gait                  Stairs            Wheelchair Mobility    Modified Rankin (Stroke Patients Only)       Balance Overall balance assessment: Needs assistance Sitting-balance support: No upper extremity supported, Feet supported Sitting balance-Leahy Scale: Poor Sitting balance - Comments: fair/poor seated EOB, requires frequent cueing to maintain                                     Pertinent Vitals/Pain Pain Assessment Pain Assessment: No/denies pain    Home Living Family/patient expects to be discharged to:: Private  residence Living Arrangements: Alone Available Help at Discharge: Family;Available PRN/intermittently Type of Home: House Home Access: Stairs to enter Entrance Stairs-Rails: Right;Left;Can reach both Entrance Stairs-Number of Steps: 3   Home Layout: One level Home Equipment: Conservation officer, nature (2 wheels);Cane - single point;Grab bars - tub/shower;BSC/3in1      Prior Function Prior Level of Function : Independent/Modified Independent;Driving              Mobility Comments: Using the cane for everything ADLs Comments: increase time and effort, but ind with IADLs/ADLs     Hand Dominance        Extremity/Trunk Assessment   Upper Extremity Assessment Upper Extremity Assessment: Generalized weakness    Lower Extremity Assessment Lower Extremity Assessment: Generalized weakness    Cervical / Trunk Assessment Cervical / Trunk Assessment: Normal  Communication   Communication: No difficulties  Cognition Arousal/Alertness: Awake/alert Behavior During Therapy: WFL for tasks assessed/performed Overall Cognitive Status: Within Functional Limits for tasks assessed                                          General Comments      Exercises     Assessment/Plan    PT Assessment Patient needs continued PT services  PT Problem List Decreased strength;Decreased mobility;Decreased activity tolerance;Decreased balance;Cardiopulmonary status limiting activity;Decreased skin integrity       PT Treatment Interventions DME instruction;Therapeutic exercise;Gait training;Balance training;Functional mobility training;Therapeutic activities;Patient/family education;Stair training;Neuromuscular re-education    PT Goals (Current goals can be found in the Care Plan section)  Acute Rehab PT Goals Patient Stated Goal: Go to rehab and go home PT Goal Formulation: With patient Time For Goal Achievement: 08/15/22 Potential to Achieve Goals: Good    Frequency Min 3X/week     Co-evaluation               AM-PAC PT "6 Clicks" Mobility  Outcome Measure Help needed turning from your back to your side while in a flat bed without using bedrails?: A Lot Help needed moving from lying on your back to sitting on the side of a flat bed without using bedrails?: A Lot Help needed moving to and from a bed to a chair (including a wheelchair)?: Total Help needed standing up from a chair using your arms (e.g., wheelchair or bedside  chair)?: Total Help needed to walk in hospital room?: Total Help needed climbing 3-5 steps with a railing? : Total 6 Click Score: 8    End of Session Equipment Utilized During Treatment: Oxygen Activity Tolerance: Patient tolerated treatment well Patient left: in bed;with family/visitor present;with call bell/phone within reach Nurse Communication: Mobility status PT Visit Diagnosis: Unsteadiness on feet (R26.81);Other abnormalities of gait and mobility (R26.89);Muscle weakness (generalized) (M62.81)    Time: 1660-6301 PT Time Calculation (min) (ACUTE ONLY): 19 min   Charges:   PT Evaluation $PT Eval Moderate Complexity: 1 Mod PT Treatments $Therapeutic Activity: 8-22 mins        2:57 PM, 08/01/22 Mearl Latin PT, DPT Physical Therapist at [facility]

## 2022-08-01 NOTE — Progress Notes (Signed)
PROGRESS NOTE  [name] GNO:[id number] DOB: [birth year] DOA: 07/28/2022 PCP: Redmond School, MD  Brief History:   76 y.o. male with medical history significant of hypertension, hyperlipidemia, diastolic heart failure, CAD s/p stent, CKD stage IV, and obesity who presents with complaints of shortness of breath and chest pain starting 2 days ago.  Chest pain is substernal and feels like pressure. He chronically has orthopnea and has a bed that he always sleeps sitting up.  He has been feeling short of breath even when he is not doing anything which is not normal for him.  Denies having any fever, nausea, vomiting, diarrhea, abdominal pain, or diaphoresis.   Patient reports that he is followed by Dr. Harl Bowie of cardiology and at the end of June had been recommended to increase his Imdur to 60 mg twice daily.  He states that this initially helped decrease symptoms of chest pain.  Then he was told to start taking furosemide 40 mg twice daily by his nephrologist Dr. Theador Hawthorne on 6/12 and was started on torsemide 100 mg daily.  Since doing that patient reports that he has been swelling more.  He developed a blister on his left leg due to the swelling and he has been putting ointment on it.  Denies having any redness or erythema from the wound.   Upon admission into the emergency patient was seen to be afebrile with blood pressures 140/82 to 158/73 and all other vital signs maintained.  Labs significant for WBC 11.6, hemoglobin 8.2, potassium 3.2, BUN 74, creatinine 3.38, glucose 170, BNP 651, and high-sensitivity troponin 42. Chest x-ray showed cardiomegaly with vascular congestion.  Patient has been given Lasix 40 mg IV, potassium chloride 40 mEq, and magnesium oxide 800 mg p.o.  07/29/2022: Pt decompensated on med surg unit and rapid response called and patient placed on bipap and transferred to stepdown ICU. Cardiology and nephrology consulted and patient started on maximal dose IV lasix by  nephrology.  Pt noted to be in atrial fibrillation.  Family was called by MD and updated with change in status and treatment plan.    07/30/2022: Pt was intubated on 8/7 after failed bipap trial.  Afib converted to NSR.  He has diuresed >3L on high dose lasix therapy.     Assessment/Plan: Acute respiratory failure with hypoxia  - secondary to acute heart failure exacerbation and atrial fibrillation - Trial of bipap failed and patient intubated by PCCM Dr. Halford Chessman.  - IV lasix maximal doses ordered per nephrology with good diuresis noted - pulmonary consult appreciated - nephrology consultation appreciated - PCCM consultation appreciated - extubated 8/9>>stable on 4L>>2L   Acute HFrEF - pt is volume overloaded from poor renal function and possibly change to diuretic - he is massively volume overloaded -IV lasix 120 mg q 12 ordered by nephrology>>paused after 8/10 am dose - limited echo 8/23: LVEF 35-40%, LV global hypokinesis - weight trending down with diuresis  - follow I/O (now neg 12.8L) - cardiology consultation appreciated  Fever -new fevere 101.0 8/10 afternoon -blood cultures x2 -   Hypertensive urgency - resume home meds and IV hydralazine ordered - Icontinue coreg, clonidine, hydralazine, imdur - soft BPs noted on precedex, agree with nephrology, changed BP meds to PRN to avoid prolonged low BP   Stage IV CKD - appreciate nephrology consultation  - baseline creatinine 3.0-3.3 - he agrees to HD if he should needed it -- discussed with pt and nephew  on 07/31/22   Chest pain / longstanding angina / CAD - appreciate cardiology consult s/p DES to LAD and D1 in 10/2012 with nonobstructive disease along RCA and LCx. Most recent ischemic evaluation was a low-risk NST in 04/2022 - he is being treated medically with ASA 81 mg, coreg, pravastatin - not cath candidate due to CKD   Leukocytosis  - stress reaction likely  - follow, no s/s of infection found at this time     Hypokalemia  - repleted - mag 2.1   Essential hypertension  - resumed home carvedilol, clonidine, isosorbide, hydralazine     Diabetes mellitus, type 2, controlled with renal complications - continue close CBG monitoring and SSI coverage    Paroxysmal Atrial Fibrillation  - new diagnosis this admission -  His CHA2DS2-VASc Score is at least 6. Has not yet been started on anticoagulation, likely due to his chronic anemia  -Unless noted to have definitive atrial fibrillation, would prefer not to commit to long-term anticoagulation.    Anemia of chronic renal disease - low threshold to transfuse for Hg<8 as it contributes to chest pain and respiratory distress    GERD  - protonix for GI protection    Hyperlipidemia - resume statin   Gout  - continue allopurinolol  - watch very closely in setting of aggressive diuresis and CKD    Obesity class I - BMI 33.88 - lifestyle modification           Family Communication:   significant other updated at bedside 8/10   Consultants:  renal, pulm, cardiology   Code Status:  FULL    DVT Prophylaxis:  [place] Heparin      Procedures: As Listed in Progress Note Above   Antibiotics: None              Subjective: Patient denies fevers, chills, headache, chest pain, dyspnea, nausea, vomiting, diarrhea, abdominal pain, dysuria  Objective: Vitals:   08/01/22 1100 08/01/22 1130 08/01/22 1300 08/01/22 1531  BP: (!) 147/45  132/63 125/71  Pulse: 73  69 79  Resp: 11  17 15   Temp:  98.9 F (37.2 C)  (!) 101 F (38.3 C)  TempSrc:  Oral  Oral  SpO2: 95%  96% 99%  Weight:      Height:        Intake/Output Summary (Last 24 hours) at 08/01/2022 1603 Last data filed at 08/01/2022 1234 Gross per 24 hour  Intake 912.55 ml  Output 2375 ml  Net -1462.45 ml   Weight change: -7.4 kg Exam:  General:  Pt is alert, follows commands appropriately, not in acute distress HEENT: No icterus, No thrush, No neck mass,  [place]/AT Cardiovascular: RRR, S1/S2, no rubs, no gallops Respiratory: bibasilar rales. No wheeze Abdomen: Soft/+BS, non tender, non distended, no guarding Extremities: 1 + LE edema, No lymphangitis, No petechiae, No rashes, no synovitis   Data Reviewed: I have personally reviewed following labs and imaging studies Basic Metabolic Panel: Recent Labs  Lab 07/28/22 2328 07/29/22 0213 07/29/22 2049 07/30/22 0331 07/31/22 0433 08/01/22 0349  NA 137  --  141 143 142 141  K 3.2*  --  3.6  3.6 3.7 3.5 3.3*  CL 103  --  105 104 101 98  CO2 23  --  26 27 28 29   GLUCOSE 170*  --  148* 145* 180* 156*  BUN 74*  --  79* 79* 92* 100*  CREATININE 3.38*  --  3.34* 3.28* 3.09* 3.36*  CALCIUM 8.9  --  8.5* 8.9 9.5 9.2  MG  --  1.9 2.2 2.1 2.1 2.1  PHOS  --   --   --   --  3.4  --    Liver Function Tests: Recent Labs  Lab 07/28/22 2328  AST 41  ALT 39  ALKPHOS 59  BILITOT 0.4  PROT 7.6  ALBUMIN 4.2   No results for input(s): "LIPASE", "AMYLASE" in the last 168 hours. No results for input(s): "AMMONIA" in the last 168 hours. Coagulation Profile: No results for input(s): "INR", "PROTIME" in the last 168 hours. CBC: Recent Labs  Lab 07/28/22 2328 07/30/22 0331 07/31/22 0433  WBC 11.6* 10.4 11.0*  NEUTROABS 9.0*  --   --   HGB 8.2* 8.0* 9.6*  HCT 25.0* 24.8* 30.5*  MCV 97.3 98.8 99.3  PLT 227 186 227   Cardiac Enzymes: No results for input(s): "CKTOTAL", "CKMB", "CKMBINDEX", "TROPONINI" in the last 168 hours. BNP: Invalid input(s): "POCBNP" CBG: Recent Labs  Lab 07/31/22 1140 07/31/22 1635 07/31/22 2127 08/01/22 0744 08/01/22 1129  GLUCAP 145* 139* 163* 161* 153*   HbA1C: No results for input(s): "HGBA1C" in the last 72 hours. Urine analysis:    Component Value Date/Time   COLORURINE STRAW (A) 07/29/2022 0230   APPEARANCEUR CLEAR 07/29/2022 0230   LABSPEC 1.005 07/29/2022 0230   PHURINE 7.0 07/29/2022 0230   GLUCOSEU NEGATIVE 07/29/2022 0230   HGBUR MODERATE (A)  07/29/2022 0230   BILIRUBINUR NEGATIVE 07/29/2022 0230   KETONESUR NEGATIVE 07/29/2022 0230   PROTEINUR NEGATIVE 07/29/2022 0230   UROBILINOGEN 1.0 03/02/2015 1100   NITRITE NEGATIVE 07/29/2022 0230   LEUKOCYTESUR NEGATIVE 07/29/2022 0230   Sepsis Labs: @LABRCNTIP (procalcitonin:4,lacticidven:4) ) Recent Results (from the past 240 hour(s))  MRSA Next Gen by PCR, Nasal     Status: None   Collection Time: 07/29/22  9:00 AM   Specimen: Nasal Mucosa; Nasal Swab  Result Value Ref Range Status   MRSA by PCR Next Gen NOT DETECTED NOT DETECTED Final    Comment: (NOTE) The GeneXpert MRSA Assay (FDA approved for NASAL specimens only), is one component of a comprehensive MRSA colonization surveillance program. It is not intended to diagnose MRSA infection nor to guide or monitor treatment for MRSA infections. Test performance is not FDA approved in patients less than 23 years old. Performed at [facility], [address] [place], [place] [address]      Scheduled Meds:  allopurinol  100 mg Oral Daily   aspirin  81 mg Oral Daily   atorvastatin  80 mg Oral Daily   carvedilol  6.25 mg Oral BID WC   Chlorhexidine Gluconate Cloth  6 each Topical Q0600   cloNIDine  0.1 mg Oral BID   darbepoetin (ARANESP) injection - NON-DIALYSIS  200 mcg Subcutaneous Q Tue-1800   heparin  5,000 Units Subcutaneous Q8H   hydrALAZINE  100 mg Oral TID   insulin aspart  0-5 Units Subcutaneous QHS   insulin aspart  0-9 Units Subcutaneous TID WC   isosorbide mononitrate  120 mg Oral Daily   pantoprazole  40 mg Oral Daily   potassium chloride  40 mEq Oral Q6H   sodium chloride flush  3 mL Intravenous Q12H   Continuous Infusions:  sodium chloride     ferric gluconate (FERRLECIT) IVPB 250 mg (08/01/22 1238)    Procedures/Studies: DG CHEST PORT 1 VIEW  Result Date: 07/31/2022 CLINICAL DATA:  76 year old male status post endotracheal tube placement. Heart failure. EXAM: PORTABLE CHEST 1 VIEW COMPARISON:  Chest  x-ray 07/31/2022. FINDINGS: An endotracheal tube is in place with tip 5.3 cm above the carina. A nasogastric tube is seen extending into the stomach, however, the tip of the nasogastric tube extends below the lower margin of the image. Lung volumes are low. Bibasilar opacities may reflect areas of atelectasis and/or consolidation, potentially with superimposed small left pleural effusion. No definite right pleural effusion. Ill-defined opacities and areas of interstitial prominence are also noted throughout the mid to lower lungs bilaterally. Crowding and cephalization of the pulmonary vasculature. No pneumothorax. Heart size is mildly enlarged. Upper mediastinal contours are within normal limits. IMPRESSION: 1. Support apparatus, as above. 2. The appearance the chest may suggest mild congestive heart failure. In addition, there are bibasilar opacities favored to reflect areas of subsegmental atelectasis, with superimposed small left pleural effusion. Electronically Signed   By: Vinnie Langton M.D.   On: 07/31/2022 05:18   DG Chest Portable 1 View  Result Date: 07/31/2022 CLINICAL DATA:  Endotracheal tube malposition EXAM: PORTABLE CHEST 1 VIEW COMPARISON:  None Available. FINDINGS: The endotracheal tube is seen above the level of the clavicular heads 10.9 cm above the carina. The balloon appears hyperinflated within the hypopharynx. Nasogastric tube extends into the upper abdomen beyond the margin of the examination. Pulmonary insufflation is normal and stable since prior examination. No pneumothorax or pleural effusion. Mild cardiomegaly is stable. Superimposed bilateral perihilar pulmonary infiltrates appear improved in the interval since prior examination, infection versus edema. IMPRESSION: 1. High position of the endotracheal tube. The balloon appears hyperinflated within the hypopharynx. Advancement of the endotracheal tube by 5-6 cm would better position the device. 2. Improving bilateral perihilar  pulmonary infiltrates, infection versus edema. 3. Stable cardiomegaly. Electronically Signed   By: Fidela Salisbury M.D.   On: 07/31/2022 04:37   DG Chest Port 1 View  Addendum Date: 07/30/2022   ADDENDUM REPORT: 07/30/2022 08:00 ADDENDUM: Additional image done at 3:30 p.m. on 07/29/2022 is submitted for review. Tip of NG tube is seen in the fundus of the stomach. Side port an NG tube is noted close to the gastroesophageal junction. Electronically Signed   By: Elmer Picker M.D.   On: 07/30/2022 08:00   Result Date: 07/30/2022 CLINICAL DATA:  Placement of endotracheal tube and orogastric tube EXAM: PORTABLE CHEST 1 VIEW COMPARISON:  Previous studies including the examination of 07/28/2022 FINDINGS: Transverse diameter heart is increased. There is interval worsening of pulmonary vascular congestion. Interval appearance of extensive alveolar densities are seen in the parahilar regions and lower lung fields, more so on the left side. Lateral CP angles are indistinct. There is no pneumothorax. Tip of endotracheal tube is 6.6 cm above the carina. NG tube is not visualized in chest. IMPRESSION: Cardiomegaly. There is interval worsening of pulmonary vascular congestion. Extensive alveolar infiltrates are seen in both lungs suggesting pulmonary edema or multifocal pneumonia with interval worsening. NG tube is not visualized and may be coiled within the mouth or oropharynx. Electronically Signed: By: Elmer Picker M.D. On: 07/29/2022 15:46   DG Chest Port 1 View  Result Date: 07/30/2022 CLINICAL DATA:  Chest pain and dyspnea. EXAM: PORTABLE CHEST 1 VIEW COMPARISON:  Portable chest 07/29/2022 FINDINGS: 4:55 a.m. ETT tip is 6.2 cm from the carina. Interval insertion NGT with the tip in the body of stomach. There is spinal stimulator wiring in the distal thoracic spinal canal. The heart is enlarged. The aorta is tortuous and ectatic with stable mediastinum. Aortic atherosclerosis. There is persistent perihilar  vascular congestion and  extensive bilateral perihilar infiltrates, more confluent on the left, with bilateral peripheral sparing and small pleural effusions. No new or worsening opacities seen. Overall aeration is generally stable. IMPRESSION: Persistent left-greater-than-right perihilar infiltrates with cardiomegaly and central vascular engorgement. Findings could be due to alveolar pulmonary edema, pneumonia or combination. Stable overall aeration noted today with no new or worsening infiltrates. Small pleural effusions appear similar. NGT tip in the body of stomach with stable ETT positioning. Aortic atherosclerosis. Electronically Signed   By: Telford Nab M.D.   On: 07/30/2022 07:21   ECHOCARDIOGRAM LIMITED  Result Date: 07/29/2022    ECHOCARDIOGRAM LIMITED REPORT   Patient Name:   RANEN DOOLIN Date of Exam: 07/29/2022 Medical Rec #:  [id number]       Height:       74.0 in Accession #:    [id number]      Weight:       270.7 lb Date of Birth:  Dec 11, 1946       BSA:          2.472 m Patient Age:    84 years        BP:           168/79 mmHg Patient Gender: M               HR:           80 bpm. Exam Location:  [facility] Procedure: Limited Echo and Intracardiac Opacification Agent Indications:    CHF  History:        Patient has prior history of Echocardiogram examinations, most                 recent 02/07/2022. CHF, CAD and Previous Myocardial Infarction,                 Arrythmias:PVC, Signs/Symptoms:Chest Pain; Risk                 Factors:Hypertension, Diabetes and Dyslipidemia. Patient on                 Bi-Pap.  Sonographer:    Wenda Low Referring Phys: [id number] Nadean Corwin HILTY  Sonographer Comments: Patient is morbidly obese. IMPRESSIONS  1. Limited echo for LVEF and regional wall motion.  2. No apical thrombus by Definity contrast. Left ventricular ejection fraction, by estimation, is 35 to 40%. Left ventricular ejection fraction by 2D MOD biplane is 36.6 %. The left ventricle has moderately  decreased function. The left ventricle demonstrates severe global hypokinesis, worse at the apex. Regional wall motion is difficult to ascertain given frequent PVC's.  3. The inferior vena cava is dilated in size with <50% respiratory variability, suggesting right atrial pressure of 15 mmHg.  4. Right ventricular systolic function was not well visualized. The right ventricular size is not well visualized.  5. Rhythm strip during this exam demonstrates atrial fibrillation. Comparison(s): Changes from prior study are noted. 02/07/2022: LVEF 60-65%, no regional WMA's. FINDINGS  Left Ventricle: No apical thrombus by Definity contrast. Left ventricular ejection fraction, by estimation, is 35 to 40%. Left ventricular ejection fraction by 2D MOD biplane is 36.6 %. The left ventricle has moderately decreased function. The left ventricle demonstrates global hypokinesis. Definity contrast agent was given IV to delineate the left ventricular endocardial borders. The left ventricular internal cavity size was normal in size. There is moderate left ventricular hypertrophy. Right Ventricle: The right ventricular size is not well visualized. Right vetricular wall thickness was not well visualized. Right  ventricular systolic function was not well visualized. Aorta: The aortic root and ascending aorta are structurally normal, with no evidence of dilitation. Venous: The inferior vena cava is dilated in size with less than 50% respiratory variability, suggesting right atrial pressure of 15 mmHg. EKG: Rhythm strip during this exam demonstrates atrial fibrillation. LEFT VENTRICLE PLAX 2D                        Biplane EF (MOD) LVIDd:         6.80 cm         LV Biplane EF:   Left LVIDs:         4.60 cm                          ventricular LV PW:         1.40 cm                          ejection LV IVS:        1.50 cm                          fraction by LVOT diam:     2.00 cm                          2D MOD LVOT Area:     3.14 cm                          biplane is                                                 36.6 %.  LV Volumes (MOD) LV vol d, MOD    122.0 ml A2C: LV vol d, MOD    128.0 ml A4C: LV vol s, MOD    74.4 ml A2C: LV vol s, MOD    89.6 ml A4C: LV SV MOD A2C:   47.6 ml LV SV MOD A4C:   128.0 ml LV SV MOD BP:    49.2 ml LEFT ATRIUM         Index LA diam:    5.50 cm 2.22 cm/m   AORTA Ao Root diam: 3.30 cm  SHUNTS Systemic Diam: 2.00 cm Lyman Bishop MD Electronically signed by Lyman Bishop MD Signature Date/Time: 07/29/2022/4:28:09 PM    Final    DG Chest Portable 1 View  Result Date: 07/28/2022 CLINICAL DATA:  Shortness of breath, chest pain EXAM: PORTABLE CHEST 1 VIEW COMPARISON:  05/29/2022 FINDINGS: Cardiomegaly, vascular congestion. No confluent opacities, effusions or edema. No acute bony abnormality. IMPRESSION: Cardiomegaly, vascular congestion. Electronically Signed   By: Rolm Baptise M.D.   On: 07/28/2022 23:46    Orson Eva, DO  [facility]  If 7PM-7AM, please contact night-coverage [email or web address] Password [email or web address] 08/01/2022, 4:03 PM   LOS: 3 days

## 2022-08-02 DIAGNOSIS — N184 Chronic kidney disease, stage 4 (severe): Secondary | ICD-10-CM | POA: Diagnosis not present

## 2022-08-02 DIAGNOSIS — I5033 Acute on chronic diastolic (congestive) heart failure: Secondary | ICD-10-CM | POA: Diagnosis not present

## 2022-08-02 DIAGNOSIS — I5031 Acute diastolic (congestive) heart failure: Secondary | ICD-10-CM | POA: Diagnosis not present

## 2022-08-02 DIAGNOSIS — J9601 Acute respiratory failure with hypoxia: Secondary | ICD-10-CM | POA: Diagnosis not present

## 2022-08-02 LAB — BASIC METABOLIC PANEL
Anion gap: 15 (ref 5–15)
BUN: 118 mg/dL — ABNORMAL HIGH (ref 8–23)
CO2: 28 mmol/L (ref 22–32)
Calcium: 9.1 mg/dL (ref 8.9–10.3)
Chloride: 100 mmol/L (ref 98–111)
Creatinine, Ser: 4.46 mg/dL — ABNORMAL HIGH (ref 0.61–1.24)
GFR, Estimated: 13 mL/min — ABNORMAL LOW (ref >60)
Glucose, Bld: 155 mg/dL — ABNORMAL HIGH (ref 70–99)
Potassium: 4.4 mmol/L (ref 3.5–5.1)
Sodium: 143 mmol/L (ref 135–145)

## 2022-08-02 LAB — BLOOD CULTURE ID PANEL (REFLEXED) - BCID2

## 2022-08-02 LAB — CBC
HCT: 26.4 % — ABNORMAL LOW (ref 39.0–52.0)
Hemoglobin: 8.1 g/dL — ABNORMAL LOW (ref 13.0–17.0)
MCH: 31.2 pg (ref 26.0–34.0)
MCHC: 30.7 g/dL (ref 30.0–36.0)
MCV: 101.5 fL — ABNORMAL HIGH (ref 80.0–100.0)
Platelets: 216 10*3/uL (ref 150–400)
RBC: 2.6 MIL/uL — ABNORMAL LOW (ref 4.22–5.81)
RDW: 16.7 % — ABNORMAL HIGH (ref 11.5–15.5)
WBC: 14.1 10*3/uL — ABNORMAL HIGH (ref 4.0–10.5)
nRBC: 0 % (ref 0.0–0.2)

## 2022-08-02 LAB — GLUCOSE, CAPILLARY
Glucose-Capillary: 155 mg/dL — ABNORMAL HIGH (ref 70–99)
Glucose-Capillary: 233 mg/dL — ABNORMAL HIGH (ref 70–99)
Glucose-Capillary: 181 mg/dL — ABNORMAL HIGH (ref 70–99)
Glucose-Capillary: 205 mg/dL — ABNORMAL HIGH (ref 70–99)

## 2022-08-02 MED ORDER — HYDROCODONE-ACETAMINOPHEN 5-325 MG PO TABS
1.0000 | ORAL_TABLET | Freq: Four times a day (QID) | ORAL | Status: DC | PRN
  Administered 2022-08-02 – 2022-08-03 (×5): 1 via ORAL
  Filled 2022-08-02 (×5): qty 1

## 2022-08-02 MED ORDER — CLONIDINE HCL 0.1 MG PO TABS
0.1000 mg | ORAL_TABLET | Freq: Two times a day (BID) | ORAL | Status: DC
  Administered 2022-08-02 – 2022-08-10 (×15): 0.1 mg via ORAL
  Filled 2022-08-02 (×16): qty 1

## 2022-08-02 MED ORDER — PANTOPRAZOLE SODIUM 40 MG PO TBEC
40.0000 mg | DELAYED_RELEASE_TABLET | Freq: Two times a day (BID) | ORAL | Status: DC
  Administered 2022-08-02 – 2022-08-07 (×10): 40 mg via ORAL
  Filled 2022-08-02 (×10): qty 1

## 2022-08-02 MED ORDER — CLONIDINE HCL 0.1 MG PO TABS: 0.1000 mg | ORAL_TABLET | Freq: Every day | ORAL | Status: DC

## 2022-08-02 MED ORDER — FUROSEMIDE 10 MG/ML IJ SOLN
80.0000 mg | Freq: Once | INTRAMUSCULAR | Status: AC
  Administered 2022-08-03: 80 mg via INTRAVENOUS
  Filled 2022-08-02: qty 8

## 2022-08-02 MED ORDER — METOCLOPRAMIDE HCL 5 MG/ML IJ SOLN
5.0000 mg | Freq: Once | INTRAMUSCULAR | Status: AC
  Administered 2022-08-02: 5 mg via INTRAVENOUS
  Filled 2022-08-02: qty 2

## 2022-08-02 MED ORDER — ACETAMINOPHEN 500 MG PO TABS
1000.0000 mg | ORAL_TABLET | Freq: Four times a day (QID) | ORAL | Status: DC
  Administered 2022-08-02 (×2): 1000 mg via ORAL
  Administered 2022-08-03: 500 mg via ORAL
  Administered 2022-08-03 – 2022-08-10 (×25): 1000 mg via ORAL
  Filled 2022-08-02 (×31): qty 2

## 2022-08-02 MED ORDER — ONDANSETRON HCL 4 MG/2ML IJ SOLN
4.0000 mg | Freq: Four times a day (QID) | INTRAMUSCULAR | Status: DC
Start: 2022-08-02 — End: 2022-08-11
  Administered 2022-08-02 – 2022-08-10 (×28): 4 mg via INTRAVENOUS
  Filled 2022-08-02 (×29): qty 2

## 2022-08-02 NOTE — Progress Notes (Signed)
Patient still having pain post 1079m tylenol ordered by Dr. TCarles Collet Have notified the physician of ongoing pain and nausea not relieved by medications given thus far.

## 2022-08-02 NOTE — Progress Notes (Signed)
PROGRESS NOTE  [name] TDS:[id number] DOB: [birth year] DOA: 07/28/2022 PCP: Redmond School, MD  Brief History:   76 y.o. male with medical history significant of hypertension, hyperlipidemia, diastolic heart failure, CAD s/p stent, CKD stage IV, and obesity who presents with complaints of shortness of breath and chest pain starting 2 days ago.  Chest pain is substernal and feels like pressure. He chronically has orthopnea and has a bed that he always sleeps sitting up.  He has been feeling short of breath even when he is not doing anything which is not normal for him.  Denies having any fever, nausea, vomiting, diarrhea, abdominal pain, or diaphoresis.   Patient reports that he is followed by Dr. Harl Bowie of cardiology and at the end of June had been recommended to increase his Imdur to 60 mg twice daily.  He states that this initially helped decrease symptoms of chest pain.  Then he was told to start taking furosemide 40 mg twice daily by his nephrologist Dr. Theador Hawthorne on 6/12 and was started on torsemide 100 mg daily.  Since doing that patient reports that he has been swelling more.  He developed a blister on his left leg due to the swelling and he has been putting ointment on it.  Denies having any redness or erythema from the wound.   Upon admission into the emergency patient was seen to be afebrile with blood pressures 140/82 to 158/73 and all other vital signs maintained.  Labs significant for WBC 11.6, hemoglobin 8.2, potassium 3.2, BUN 74, creatinine 3.38, glucose 170, BNP 651, and high-sensitivity troponin 42. Chest x-ray showed cardiomegaly with vascular congestion.  Patient has been given Lasix 40 mg IV, potassium chloride 40 mEq, and magnesium oxide 800 mg p.o.  07/29/2022: Pt decompensated on med surg unit and rapid response called and patient placed on bipap and transferred to stepdown ICU. Cardiology and nephrology consulted and patient started on maximal dose IV lasix by  nephrology.  Pt noted to be in atrial fibrillation.  Family was called by MD and updated with change in status and treatment plan.    07/30/2022: Pt was intubated on 8/7 after failed bipap trial.  Afib converted to NSR.  He has diuresed >3L on high dose lasix therapy.     Assessment/Plan: Acute respiratory failure with hypoxia  - secondary to acute heart failure exacerbation and atrial fibrillation - Trial of bipap failed and patient intubated by PCCM Dr. Halford Chessman.  - IV lasix maximal doses ordered per nephrology with good diuresis noted - pulmonary consult appreciated - nephrology consultation appreciated - PCCM consultation appreciated - extubated 8/9>>stable on 4L>>2L   Acute HFrEF - he was massively volume overloaded at presentation -IV lasix 120 mg q 12 ordered by nephrology>>paused after 8/10 am dose - further lasix dosing per nephrology - limited echo 8/23: LVEF 35-40%, LV global hypokinesis - weight trending down with diuresis  - follow I/O (now neg 13.5L) - cardiology consultation appreciated>>not cath candidate   Acute on chronic CKD Stage IV CKD - appreciate nephrology consultation  - baseline creatinine 3.0-3.3 - he agrees to HD if he should needed it -- discussed with pt and brother 8/9 and 8/11 - 8/11 uptrend in serum creatinine to 4.46  Fever -new fevere 101.0 8/10 afternoon -8/10 blood cultures x2>>GPC in 1 of 2 sets -await BCID before starting abx as suspect this is contaminant   Hypertensive urgency - resume home meds and IV hydralazine  ordered - Icontinue coreg, clonidine, hydralazine, imdur - soft BPs noted on precedex, agree with nephrology, changed BP meds to PRN to avoid prolonged low BP   Opioid Dependence/chronic back pain -PDMP does not show any recent opioid Rx in last 2 years -verified with PCP office pt is on norco 5/325 q 6 hrs prn>>restart   Chest pain / longstanding angina / CAD - appreciate cardiology consult s/p DES to LAD and D1 in 10/2012  with nonobstructive disease along RCA and LCx. Most recent ischemic evaluation was a low-risk NST in 04/2022 - he is being treated medically with ASA 81 mg, coreg, pravastatin - not cath candidate due to CKD   Leukocytosis  - stress reaction likely  - follow, no s/s of infection found at this time    Hypokalemia  - repleted - mag 2.1   Essential hypertension  - resumed home carvedilol, clonidine, isosorbide, hydralazine     Diabetes mellitus, type 2, controlled with renal complications - continue close CBG monitoring and SSI coverage    Paroxysmal Atrial Fibrillation  - new diagnosis this admission -  His CHA2DS2-VASc Score is at least 6. Has not yet been started on anticoagulation, likely due to his chronic anemia  -Unless noted to have definitive atrial fibrillation, would prefer not to commit to long-term anticoagulation.  -cardiology following   Anemia of chronic renal disease - low threshold to transfuse for Hg<8 as it contributes to chest pain and respiratory distress    GERD  - protonix for GI protection    Hyperlipidemia - resume statin   Gout  - continue allopurinolol  - watch very closely in setting of aggressive diuresis and CKD    Obesity class I - BMI 33.88 - lifestyle modification           Family Communication:   brother updated at bedside 8/11   Consultants:  renal, pulm, cardiology   Code Status:  FULL    DVT Prophylaxis:  [place] Heparin      Procedures: As Listed in Progress Note Above   Antibiotics: None         Subjective: Patient denies fevers, chills, headache, chest pain, dyspnea, diarrhea, abdominal pain, dysuria, hematuria, hematochezia, and melena. He complains of nausea and back pain   Objective: Vitals:   08/01/22 2128 08/02/22 0606 08/02/22 0626 08/02/22 1300  BP: (!) 111/49 (!) 147/75  (!) 151/71  Pulse: 70 71  79  Resp: 18 17  18   Temp: 99.8 F (37.7 C) 97.8 F (36.6 C)  99.6 F (37.6 C)  TempSrc:    Oral   SpO2: 100% 100%  100%  Weight:   111.1 kg   Height:        Intake/Output Summary (Last 24 hours) at 08/02/2022 1617 Last data filed at 08/02/2022 1300 Gross per 24 hour  Intake 1116.72 ml  Output 850 ml  Net 266.72 ml   Weight change: -1.2 kg Exam:  General:  Pt is alert, follows commands appropriately, not in acute distress HEENT: No icterus, No thrush, No neck mass, [place]/AT Cardiovascular: RRR, S1/S2, no rubs, no gallops Respiratory: bibasilar crackles. No wheeze Abdomen: Soft/+BS, non tender, non distended, no guarding Extremities: 1 + LE edema, No lymphangitis, No petechiae, No rashes, no synovitis   Data Reviewed: I have personally reviewed following labs and imaging studies Basic Metabolic Panel: Recent Labs  Lab 07/29/22 0213 07/29/22 2049 07/30/22 0331 07/31/22 0433 08/01/22 0349 08/02/22 0652  NA  --  141 143 142 141  143  K  --  3.6  3.6 3.7 3.5 3.3* 4.4  CL  --  105 104 101 98 100  CO2  --  26 27 28 29 28   GLUCOSE  --  148* 145* 180* 156* 155*  BUN  --  79* 79* 92* 100* 118*  CREATININE  --  3.34* 3.28* 3.09* 3.36* 4.46*  CALCIUM  --  8.5* 8.9 9.5 9.2 9.1  MG 1.9 2.2 2.1 2.1 2.1  --   PHOS  --   --   --  3.4  --   --    Liver Function Tests: Recent Labs  Lab 07/28/22 2328  AST 41  ALT 39  ALKPHOS 59  BILITOT 0.4  PROT 7.6  ALBUMIN 4.2   No results for input(s): "LIPASE", "AMYLASE" in the last 168 hours. No results for input(s): "AMMONIA" in the last 168 hours. Coagulation Profile: No results for input(s): "INR", "PROTIME" in the last 168 hours. CBC: Recent Labs  Lab 07/28/22 2328 07/30/22 0331 07/31/22 0433 08/02/22 0652  WBC 11.6* 10.4 11.0* 14.1*  NEUTROABS 9.0*  --   --   --   HGB 8.2* 8.0* 9.6* 8.1*  HCT 25.0* 24.8* 30.5* 26.4*  MCV 97.3 98.8 99.3 101.5*  PLT 227 186 227 216   Cardiac Enzymes: No results for input(s): "CKTOTAL", "CKMB", "CKMBINDEX", "TROPONINI" in the last 168 hours. BNP: Invalid input(s):  "POCBNP" CBG: Recent Labs  Lab 08/01/22 1129 08/01/22 1614 08/01/22 2124 08/02/22 0710 08/02/22 1104  GLUCAP 153* 161* 165* 155* 233*   HbA1C: No results for input(s): "HGBA1C" in the last 72 hours. Urine analysis:    Component Value Date/Time   COLORURINE YELLOW 08/01/2022 1629   APPEARANCEUR CLEAR 08/01/2022 1629   LABSPEC 1.011 08/01/2022 1629   PHURINE 5.0 08/01/2022 1629   GLUCOSEU NEGATIVE 08/01/2022 1629   HGBUR NEGATIVE 08/01/2022 1629   BILIRUBINUR NEGATIVE 08/01/2022 1629   KETONESUR NEGATIVE 08/01/2022 1629   PROTEINUR 30 (A) 08/01/2022 1629   UROBILINOGEN 1.0 03/02/2015 1100   NITRITE NEGATIVE 08/01/2022 1629   LEUKOCYTESUR NEGATIVE 08/01/2022 1629   Sepsis Labs: @LABRCNTIP (procalcitonin:4,lacticidven:4) ) Recent Results (from the past 240 hour(s))  MRSA Next Gen by PCR, Nasal     Status: None   Collection Time: 07/29/22  9:00 AM   Specimen: Nasal Mucosa; Nasal Swab  Result Value Ref Range Status   MRSA by PCR Next Gen NOT DETECTED NOT DETECTED Final    Comment: (NOTE) The GeneXpert MRSA Assay (FDA approved for NASAL specimens only), is one component of a comprehensive MRSA colonization surveillance program. It is not intended to diagnose MRSA infection nor to guide or monitor treatment for MRSA infections. Test performance is not FDA approved in patients less than 3 years old. Performed at [facility], [address] [place], [place] [address]   Culture, blood (Routine X 2) w Reflex to ID Panel     Status: None (Preliminary result)   Collection Time: 08/01/22  4:54 PM   Specimen: BLOOD LEFT WRIST  Result Value Ref Range Status   Specimen Description BLOOD LEFT WRIST  Final   Special Requests   Final    BOTTLES DRAWN AEROBIC AND ANAEROBIC Blood Culture results may not be optimal due to an excessive volume of blood received in culture bottles   Culture   Final    NO GROWTH < 12 HOURS Performed at [facility], [address] [place], [place]  [address]    Report Status PENDING  Incomplete  Culture, blood (Routine X 2) w Reflex to ID Panel     Status: None (Preliminary result)   Collection Time: 08/01/22  4:54 PM   Specimen: BLOOD LEFT HAND  Result Value Ref Range Status   Specimen Description BLOOD LEFT HAND  Final   Special Requests   Final    BOTTLES DRAWN AEROBIC AND ANAEROBIC Blood Culture adequate volume   Culture  Setup Time   Final    GRAM POSITIVE COCCI AEROBIC BOTTLE ONLY Gram Stain Report Called to,Read Back By and Verified With: BERNHARD @ [id number] ON [id number] BY HENDERSON L    Culture   Final    NO GROWTH < 12 HOURS Performed at [facility], [address], [place], [place] [address]    Report Status PENDING  Incomplete     Scheduled Meds:  acetaminophen  1,000 mg Oral Q6H   allopurinol  100 mg Oral Daily   aspirin  81 mg Oral Daily   atorvastatin  80 mg Oral Daily   carvedilol  6.25 mg Oral BID WC   Chlorhexidine Gluconate Cloth  6 each Topical Q0600   cloNIDine  0.1 mg Oral BID   darbepoetin (ARANESP) injection - NON-DIALYSIS  200 mcg Subcutaneous Q Tue-1800   feeding supplement  237 mL Oral BID BM   [START ON 08/03/2022] furosemide  80 mg Intravenous Once   heparin  5,000 Units Subcutaneous Q8H   hydrALAZINE  100 mg Oral TID   insulin aspart  0-5 Units Subcutaneous QHS   insulin aspart  0-9 Units Subcutaneous TID WC   isosorbide mononitrate  120 mg Oral Daily   metoCLOPramide (REGLAN) injection  5 mg Intravenous Once   ondansetron (ZOFRAN) IV  4 mg Intravenous Q6H   pantoprazole  40 mg Oral BID   sodium chloride flush  3 mL Intravenous Q12H   Continuous Infusions:  sodium chloride     meropenem (MERREM) IV 1 g (08/02/22 0540)    Procedures/Studies: DG CHEST PORT 1 VIEW  Result Date: 07/31/2022 CLINICAL DATA:  76 year old male status post endotracheal tube placement. Heart failure. EXAM: PORTABLE CHEST 1 VIEW COMPARISON:  Chest x-ray 07/31/2022. FINDINGS: An endotracheal tube is in place with tip 5.3 cm  above the carina. A nasogastric tube is seen extending into the stomach, however, the tip of the nasogastric tube extends below the lower margin of the image. Lung volumes are low. Bibasilar opacities may reflect areas of atelectasis and/or consolidation, potentially with superimposed small left pleural effusion. No definite right pleural effusion. Ill-defined opacities and areas of interstitial prominence are also noted throughout the mid to lower lungs bilaterally. Crowding and cephalization of the pulmonary vasculature. No pneumothorax. Heart size is mildly enlarged. Upper mediastinal contours are within normal limits. IMPRESSION: 1. Support apparatus, as above. 2. The appearance the chest may suggest mild congestive heart failure. In addition, there are bibasilar opacities favored to reflect areas of subsegmental atelectasis, with superimposed small left pleural effusion. Electronically Signed   By: Vinnie Langton M.D.   On: 07/31/2022 05:18   DG Chest Portable 1 View  Result Date: 07/31/2022 CLINICAL DATA:  Endotracheal tube malposition EXAM: PORTABLE CHEST 1 VIEW COMPARISON:  None Available. FINDINGS: The endotracheal tube is seen above the level of the clavicular heads 10.9 cm above the carina. The balloon appears hyperinflated within the hypopharynx. Nasogastric tube extends into the upper abdomen beyond the margin of the examination. Pulmonary insufflation is normal and stable since prior examination. No pneumothorax or pleural effusion. Mild  cardiomegaly is stable. Superimposed bilateral perihilar pulmonary infiltrates appear improved in the interval since prior examination, infection versus edema. IMPRESSION: 1. High position of the endotracheal tube. The balloon appears hyperinflated within the hypopharynx. Advancement of the endotracheal tube by 5-6 cm would better position the device. 2. Improving bilateral perihilar pulmonary infiltrates, infection versus edema. 3. Stable cardiomegaly.  Electronically Signed   By: Fidela Salisbury M.D.   On: 07/31/2022 04:37   DG Chest Port 1 View  Addendum Date: 07/30/2022   ADDENDUM REPORT: 07/30/2022 08:00 ADDENDUM: Additional image done at 3:30 p.m. on 07/29/2022 is submitted for review. Tip of NG tube is seen in the fundus of the stomach. Side port an NG tube is noted close to the gastroesophageal junction. Electronically Signed   By: Elmer Picker M.D.   On: 07/30/2022 08:00   Result Date: 07/30/2022 CLINICAL DATA:  Placement of endotracheal tube and orogastric tube EXAM: PORTABLE CHEST 1 VIEW COMPARISON:  Previous studies including the examination of 07/28/2022 FINDINGS: Transverse diameter heart is increased. There is interval worsening of pulmonary vascular congestion. Interval appearance of extensive alveolar densities are seen in the parahilar regions and lower lung fields, more so on the left side. Lateral CP angles are indistinct. There is no pneumothorax. Tip of endotracheal tube is 6.6 cm above the carina. NG tube is not visualized in chest. IMPRESSION: Cardiomegaly. There is interval worsening of pulmonary vascular congestion. Extensive alveolar infiltrates are seen in both lungs suggesting pulmonary edema or multifocal pneumonia with interval worsening. NG tube is not visualized and may be coiled within the mouth or oropharynx. Electronically Signed: By: Elmer Picker M.D. On: 07/29/2022 15:46   DG Chest Port 1 View  Result Date: 07/30/2022 CLINICAL DATA:  Chest pain and dyspnea. EXAM: PORTABLE CHEST 1 VIEW COMPARISON:  Portable chest 07/29/2022 FINDINGS: 4:55 a.m. ETT tip is 6.2 cm from the carina. Interval insertion NGT with the tip in the body of stomach. There is spinal stimulator wiring in the distal thoracic spinal canal. The heart is enlarged. The aorta is tortuous and ectatic with stable mediastinum. Aortic atherosclerosis. There is persistent perihilar vascular congestion and extensive bilateral perihilar infiltrates, more  confluent on the left, with bilateral peripheral sparing and small pleural effusions. No new or worsening opacities seen. Overall aeration is generally stable. IMPRESSION: Persistent left-greater-than-right perihilar infiltrates with cardiomegaly and central vascular engorgement. Findings could be due to alveolar pulmonary edema, pneumonia or combination. Stable overall aeration noted today with no new or worsening infiltrates. Small pleural effusions appear similar. NGT tip in the body of stomach with stable ETT positioning. Aortic atherosclerosis. Electronically Signed   By: Telford Nab M.D.   On: 07/30/2022 07:21   ECHOCARDIOGRAM LIMITED  Result Date: 07/29/2022    ECHOCARDIOGRAM LIMITED REPORT   Patient Name:   [name] Date of Exam: 07/29/2022 Medical Rec #:  [id number]       Height:       74.0 in Accession #:    [id number]      Weight:       270.7 lb Date of Birth:  January 02, 1946       BSA:          2.472 m Patient Age:    18 years        BP:           168/79 mmHg Patient Gender: M               HR:  80 bpm. Exam Location:  [facility] Procedure: Limited Echo and Intracardiac Opacification Agent Indications:    CHF  History:        Patient has prior history of Echocardiogram examinations, most                 recent 02/07/2022. CHF, CAD and Previous Myocardial Infarction,                 Arrythmias:PVC, Signs/Symptoms:Chest Pain; Risk                 Factors:Hypertension, Diabetes and Dyslipidemia. Patient on                 Bi-Pap.  Sonographer:    Wenda Low Referring Phys: [phone] Nadean Corwin HILTY  Sonographer Comments: Patient is morbidly obese. IMPRESSIONS  1. Limited echo for LVEF and regional wall motion.  2. No apical thrombus by Definity contrast. Left ventricular ejection fraction, by estimation, is 35 to 40%. Left ventricular ejection fraction by 2D MOD biplane is 36.6 %. The left ventricle has moderately decreased function. The left ventricle demonstrates severe global hypokinesis,  worse at the apex. Regional wall motion is difficult to ascertain given frequent PVC's.  3. The inferior vena cava is dilated in size with <50% respiratory variability, suggesting right atrial pressure of 15 mmHg.  4. Right ventricular systolic function was not well visualized. The right ventricular size is not well visualized.  5. Rhythm strip during this exam demonstrates atrial fibrillation. Comparison(s): Changes from prior study are noted. 02/07/2022: LVEF 60-65%, no regional WMA's. FINDINGS  Left Ventricle: No apical thrombus by Definity contrast. Left ventricular ejection fraction, by estimation, is 35 to 40%. Left ventricular ejection fraction by 2D MOD biplane is 36.6 %. The left ventricle has moderately decreased function. The left ventricle demonstrates global hypokinesis. Definity contrast agent was given IV to delineate the left ventricular endocardial borders. The left ventricular internal cavity size was normal in size. There is moderate left ventricular hypertrophy. Right Ventricle: The right ventricular size is not well visualized. Right vetricular wall thickness was not well visualized. Right ventricular systolic function was not well visualized. Aorta: The aortic root and ascending aorta are structurally normal, with no evidence of dilitation. Venous: The inferior vena cava is dilated in size with less than 50% respiratory variability, suggesting right atrial pressure of 15 mmHg. EKG: Rhythm strip during this exam demonstrates atrial fibrillation. LEFT VENTRICLE PLAX 2D                        Biplane EF (MOD) LVIDd:         6.80 cm         LV Biplane EF:   Left LVIDs:         4.60 cm                          ventricular LV PW:         1.40 cm                          ejection LV IVS:        1.50 cm                          fraction by LVOT diam:     2.00 cm  2D MOD LVOT Area:     3.14 cm                         biplane is                                                 36.6  %.  LV Volumes (MOD) LV vol d, MOD    122.0 ml A2C: LV vol d, MOD    128.0 ml A4C: LV vol s, MOD    74.4 ml A2C: LV vol s, MOD    89.6 ml A4C: LV SV MOD A2C:   47.6 ml LV SV MOD A4C:   128.0 ml LV SV MOD BP:    49.2 ml LEFT ATRIUM         Index LA diam:    5.50 cm 2.22 cm/m   AORTA Ao Root diam: 3.30 cm  SHUNTS Systemic Diam: 2.00 cm Lyman Bishop MD Electronically signed by Lyman Bishop MD Signature Date/Time: 07/29/2022/4:28:09 PM    Final    DG Chest Portable 1 View  Result Date: 07/28/2022 CLINICAL DATA:  Shortness of breath, chest pain EXAM: PORTABLE CHEST 1 VIEW COMPARISON:  05/29/2022 FINDINGS: Cardiomegaly, vascular congestion. No confluent opacities, effusions or edema. No acute bony abnormality. IMPRESSION: Cardiomegaly, vascular congestion. Electronically Signed   By: Rolm Baptise M.D.   On: 07/28/2022 23:46    Orson Eva, DO  [facility]  If 7PM-7AM, please contact night-coverage [email or web address] Password [facility] 08/02/2022, 4:17 PM   LOS: 4 days

## 2022-08-02 NOTE — Progress Notes (Signed)
Progress Note  Patient Name: KANE KUSEK Date of Encounter: 08/02/2022  [facility] Cardiologist: Carlyle Dolly, MD   Subjective   Breathing has improved, no complaints  Inpatient Medications    Scheduled Meds:  acetaminophen  1,000 mg Oral Q6H   allopurinol  100 mg Oral Daily   aspirin  81 mg Oral Daily   atorvastatin  80 mg Oral Daily   carvedilol  6.25 mg Oral BID WC   Chlorhexidine Gluconate Cloth  6 each Topical Q0600   cloNIDine  0.1 mg Oral BID   darbepoetin (ARANESP) injection - NON-DIALYSIS  200 mcg Subcutaneous Q Tue-1800   feeding supplement  237 mL Oral BID BM   heparin  5,000 Units Subcutaneous Q8H   hydrALAZINE  100 mg Oral TID   insulin aspart  0-5 Units Subcutaneous QHS   insulin aspart  0-9 Units Subcutaneous TID WC   isosorbide mononitrate  120 mg Oral Daily   pantoprazole  40 mg Oral Daily   sodium chloride flush  3 mL Intravenous Q12H   Continuous Infusions:  sodium chloride     ferric gluconate (FERRLECIT) IVPB Stopped (08/01/22 1338)   meropenem (MERREM) IV 1 g (08/02/22 0540)   PRN Meds: sodium chloride, acetaminophen, ondansetron (ZOFRAN) IV, mouth rinse, polyethylene glycol, sodium chloride flush   Vital Signs    Vitals:   08/01/22 1800 08/01/22 2128 08/02/22 0606 08/02/22 0626  BP:  (!) 111/49 (!) 147/75   Pulse:  70 71   Resp:  18 17   Temp: (!) 100.6 F (38.1 C) 99.8 F (37.7 C) 97.8 F (36.6 C)   TempSrc: Oral     SpO2:  100% 100%   Weight:    111.1 kg  Height:        Intake/Output Summary (Last 24 hours) at 08/02/2022 0944 Last data filed at 08/02/2022 0900 Gross per 24 hour  Intake 938.72 ml  Output 1350 ml  Net -411.28 ml      08/02/2022    6:26 AM 08/01/2022    6:32 AM 07/31/2022    5:00 AM  Last 3 Weights  Weight (lbs) 244 lb 14.9 oz 247 lb 9.2 oz 263 lb 14.3 oz  Weight (kg) 111.1 kg 112.3 kg 119.7 kg      Telemetry    SR, first degree av block, PVCs - Personally Reviewed  ECG    N/a - Personally  Reviewed  Physical Exam   GEN: No acute distress.   Neck: No JVD Cardiac: RRR, no murmurs, rubs, or gallops.  Respiratory: Clear to auscultation bilaterally. GI: Soft, nontender, non-distended  MS: No edema; No deformity. Neuro:  Nonfocal  Psych: Normal affect   Labs    High Sensitivity Troponin:   Recent Labs  Lab 07/28/22 2328 07/29/22 0213  TROPONINIHS 42* 41*     Chemistry Recent Labs  Lab 07/28/22 2328 07/29/22 0213 07/30/22 0331 07/31/22 0433 08/01/22 0349 08/02/22 0652  NA 137   < > 143 142 141 143  K 3.2*   < > 3.7 3.5 3.3* 4.4  CL 103   < > 104 101 98 100  CO2 23   < > 27 28 29 28   GLUCOSE 170*   < > 145* 180* 156* 155*  BUN 74*   < > 79* 92* 100* 118*  CREATININE 3.38*   < > 3.28* 3.09* 3.36* 4.46*  CALCIUM 8.9   < > 8.9 9.5 9.2 9.1  MG  --    < >  2.1 2.1 2.1  --   PROT 7.6  --   --   --   --   --   ALBUMIN 4.2  --   --   --   --   --   AST 41  --   --   --   --   --   ALT 39  --   --   --   --   --   ALKPHOS 59  --   --   --   --   --   BILITOT 0.4  --   --   --   --   --   GFRNONAA 18*   < > 19* 20* 18* 13*  ANIONGAP 11   < > 12 13 14 15    < > = values in this interval not displayed.    Lipids No results for input(s): "CHOL", "TRIG", "HDL", "LABVLDL", "LDLCALC", "CHOLHDL" in the last 168 hours.  Hematology Recent Labs  Lab 07/30/22 0331 07/31/22 0433 08/02/22 0652  WBC 10.4 11.0* 14.1*  RBC 2.51* 3.07* 2.60*  HGB 8.0* 9.6* 8.1*  HCT 24.8* 30.5* 26.4*  MCV 98.8 99.3 101.5*  MCH 31.9 31.3 31.2  MCHC 32.3 31.5 30.7  RDW 16.5* 16.9* 16.7*  PLT 186 227 216   Thyroid No results for input(s): "TSH", "FREET4" in the last 168 hours.  BNP Recent Labs  Lab 07/28/22 2338  BNP 651.0*    DDimer No results for input(s): "DDIMER" in the last 168 hours.   Radiology    No results found.  Cardiac Studies     Patient Profile     76 y.o. male w/ PMH of CAD (s/p DES to LAD and D1 in 10/2012 with nonobstructive disease along RCA and LCx,  low-risk NST in 04/2022), HFpEF, HTN, HLD and Stage 4 CKD who is currently admitted for an acute CHF exacerbation.  Assessment & Plan    1.Acute HFrEF - 01/2022 echo : LVEF 60-65% - 07/2022 echo LVEF 35-40% - initially bipap, later intubated. Extubated 07/31/22   - neg 651 mL yesterday, neg 13.5 L since admission. He is in IV lasix 130m bid, diuretics per neprhology. Uptrend in Cr, diuretics currently held. Appears euvolemic on exam - medical therapy limited due to renal dysfunction. He is on coreg 6.264mbid, hydral 10025mid, imdur 120 - poor cath candidate at this time given renal function. Myoview 04/2022 showed inferior infarct and small apical infarct with mild peri-infarct ischemia   2. CAD - He is s/p DES to LAD and D1 in 10/2012 with nonobstructive disease along RCA and LCx. 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  - no chest pains this admit   3.CKD IV - per neprhology   4. First degree AV block/[place] - monitor while on beta blocker - have not seen afib    For questions or updates, please contact [facility] Please consult [email or web address] for contact info under        Signed, BraCarlyle DollyD  08/02/2022, 9:44 AM

## 2022-08-02 NOTE — NC FL2 (Signed)
[place] LEVEL OF CARE SCREENING TOOL     IDENTIFICATION  Patient Name: [name] Birthdate: Nov 07, 1946 Sex: male Admission Date (Current Location): 07/28/2022  [facility] and [place] Number:  [employer] and Address:  [place] [address] [place]      Provider Number: [id number]  Attending Physician Name and Address:  Orson Eva, MD  Relative Name and Phone Number:       Current Level of Care: Hospital Recommended Level of Care: [place] Prior Approval Number:    Date Approved/Denied:   PASRR Number: [id number]  Discharge Plan: SNF    Current Diagnoses: Patient Active Problem List   Diagnosis Date Noted   HFrEF (heart failure with reduced ejection fraction) ([place])    (HFpEF) heart failure with preserved ejection fraction ([place]) 07/29/2022   Hypokalemia 07/29/2022   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   Acute respiratory failure with hypoxia ([place])    B12 deficiency 07/18/2022   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/2022   Diabetic neuropathy ([place]) 05/10/2022   Elevated troponin 05/10/2022   Type 2 diabetes mellitus with hyperglycemia ([place]) 05/10/2022   Other intervertebral disc degeneration, lumbar region 02/06/2022   Chronic gout, unspecified, 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ophi) 02/06/2022   Body mass index (BMI) 35.0-35.9, adult 02/06/2022   Obesity 02/06/2022   Primary generalized (osteo)arthritis 02/06/2022   Chest pain 02/06/2022   Polyneuropathy 10/29/2021   Bilateral lower extremity edema 08/28/2021   Other chronic pain 08/28/2021   Lumbago with sciatica, right side 08/28/2021   Weakness of both lower extremities 08/28/2021   Body mass index (BMI) 33.0-33.9, adult 08/28/2021   Constipation 02/15/2021   Abdominal pain 01/02/2021   Diabetes mellitus ([place]) 11/09/2020   Vertigo 11/09/2020   Hypertensive disorder 11/09/2020   Hypercholesterolemia 11/09/2020   H/O adenomatous polyp of colon 05/05/2020    Iron deficiency anemia 05/05/2020   Chronic kidney disease, stage IV (severe) ([place]) 12/14/2019   Acute gouty arthritis 10/25/2019   Sepsis ([place]) 04/02/2019   Leukocytosis 04/02/2019   Cellulitis of right hand 04/01/2019   Osteoarthritis of ankle and foot 02/08/2019   Peripheral edema    CHF (congestive heart failure) ([place]) 07/05/2015   Acute congestive heart failure ([place]) 07/04/2015   Acute on chronic diastolic congestive heart failure ([place]) 07/04/2015   Mixed hyperlipidemia 07/04/2015   Anxiety state 07/04/2015   GERD (gastroesophageal reflux disease) 07/04/2015   Heart block 07/04/2015   Gastroesophageal reflux disease 07/04/2015   Edema 69/79/4801   Diastolic CHF, chronic (HCC) 03/11/2015   Chronic diastolic heart failure ([place]) 03/11/2015   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   Muscle weakness (generalized) 01/13/2014   Knee stiffness 01/13/2014   Gout 12/11/2013   Cellulitis 11/19/2013   Expected blood loss anemia 11/17/2013   Obese 11/17/2013   S/P right TKA 11/15/2013   Anemia, normocytic normochromic 01/12/2013   Normocytic normochromic anemia 01/12/2013   Acute kidney injury superimposed on chronic kidney disease ([place]) 12/10/2012   Diabetes mellitus type 2 in obese ([place]) 12/10/2012   CAD S/P percutaneous coronary angioplasty 11/24/2012   Overweight(278.02) 11/17/2012   Obstructive sleep apnea 11/15/2012   Chronic kidney disease, stage 3, cardiorenal syndrome 08/23/2011   Chronic kidney disease, stage 3 unspecified ([place]) 08/23/2011   Essential hypertension     Orientation RESPIRATION BLADDER Height & Weight     Self, Situation, Place  O2 (see dc summary) Incontinent Weight: 244 lb 14.9 oz (111.1 kg) Height:  6' 2"  (188 cm)  BEHAVIORAL SYMPTOMS/MOOD NEUROLOGICAL BOWEL NUTRITION STATUS      Continent Diet (see dc summary)  AMBULATORY STATUS COMMUNICATION OF NEEDS Skin   Extensive Assist Verbally Normal                       Personal Care Assistance Level of Assistance   Bathing, Feeding, Dressing Bathing Assistance: Maximum assistance Feeding assistance: Limited assistance Dressing Assistance: Maximum assistance     Functional Limitations Info  Sight, Hearing, Speech Sight Info: Adequate Hearing Info: Adequate Speech Info: Adequate    SPECIAL CARE FACTORS FREQUENCY  PT (By licensed PT), OT (By licensed OT)     PT Frequency: 5x week OT Frequency: 3x week            Contractures Contractures Info: Not present    Additional Factors Info  Code Status, Allergies Code Status Info: Full Allergies Info: Metformin, Niacin           Current Medications (08/02/2022):  This is the current hospital active medication list Current Facility-Administered Medications  Medication Dose Route Frequency Provider Last Rate Last Admin   0.9 %  sodium chloride infusion  250 mL Intravenous PRN Fuller Plan A, MD       acetaminophen (TYLENOL) 160 MG/5ML solution 650 mg  650 mg Per Tube Q4H PRN Chesley Mires, MD   650 mg at 08/01/22 1600   acetaminophen (TYLENOL) tablet 1,000 mg  1,000 mg Oral Q6H Tat, [name], MD   1,000 mg at 08/02/22 0849   allopurinol (ZYLOPRIM) tablet 100 mg  100 mg Oral Daily Chesley Mires, MD   100 mg at 08/02/22 0849   aspirin chewable tablet 81 mg  81 mg Oral Daily Chesley Mires, MD   81 mg at 08/02/22 0849   atorvastatin (LIPITOR) tablet 80 mg  80 mg Oral Daily Chesley Mires, MD   80 mg at 08/02/22 0850   carvedilol (COREG) tablet 6.25 mg  6.25 mg Oral BID WC Chesley Mires, MD   6.25 mg at 08/02/22 0850   Chlorhexidine Gluconate Cloth 2 % PADS 6 each  6 each Topical Q0600 Irwin Brakeman L, MD   6 each at 08/02/22 0550   cloNIDine (CATAPRES) tablet 0.1 mg  0.1 mg Oral BID Arnoldo Lenis, MD       Darbepoetin Alfa (ARANESP) injection 200 mcg  200 mcg Subcutaneous Q Tue-1800 Corliss Parish, MD   200 mcg at 07/30/22 1805   feeding supplement (ENSURE ENLIVE / ENSURE PLUS) liquid 237 mL  237 mL Oral BID BM Tat, [name], MD   237 mL at  08/02/22 1355   [START ON 08/03/2022] furosemide (LASIX) injection 80 mg  80 mg Intravenous Once Harrie Jeans C, MD       heparin injection 5,000 Units  5,000 Units Subcutaneous Q8H Fuller Plan A, MD   5,000 Units at 08/02/22 1355   hydrALAZINE (APRESOLINE) tablet 100 mg  100 mg Oral TID Chesley Mires, MD   100 mg at 08/02/22 0849   HYDROcodone-acetaminophen (NORCO/VICODIN) 5-325 MG per tablet 1 tablet  1 tablet Oral Q6H PRN Tat, [name], MD   1 tablet at 08/02/22 1355   insulin aspart (novoLOG) injection 0-5 Units  0-5 Units Subcutaneous QHS Sood, Vineet, MD       insulin aspart (novoLOG) injection 0-9 Units  0-9 Units Subcutaneous TID WC Chesley Mires, MD   3 Units at 08/02/22 1356   isosorbide mononitrate (IMDUR) 24 hr tablet 120 mg  120 mg Oral Daily  Geralynn Rile, MD   120 mg at 08/02/22 0848   meropenem (MERREM) 1 g in sodium chloride 0.9 % 100 mL IVPB  1 g Intravenous Therisa Doyne, MD 200 mL/hr at 08/02/22 0540 1 g at 08/02/22 0540   ondansetron (ZOFRAN) injection 4 mg  4 mg Intravenous Q6H PRN Fuller Plan A, MD   4 mg at 08/02/22 0550   Oral care mouth rinse  15 mL Mouth Rinse PRN Chesley Mires, MD       pantoprazole (PROTONIX) EC tablet 40 mg  40 mg Oral BID Tat, [name], MD       polyethylene glycol (MIRALAX / GLYCOLAX) packet 17 g  17 g Per Tube Daily PRN Chesley Mires, MD       sodium chloride flush (NS) 0.9 % injection 3 mL  3 mL Intravenous Q12H Smith, Rondell A, MD   3 mL at 08/02/22 1356   sodium chloride flush (NS) 0.9 % injection 3 mL  3 mL Intravenous PRN Norval Morton, MD         Discharge Medications: Please see discharge summary for a list of discharge medications.  Relevant Imaging Results:  Relevant Lab Results:   Additional Information SSN: [place]  Shade Flood, LCSW

## 2022-08-02 NOTE — Progress Notes (Signed)
Physical Therapy Treatment Patient Details Name: [name] MRN: [id number] DOB: 08/31/1946 Today's Date: 08/02/2022   History of Present Illness [name] is a 76 y.o. male with medical history significant of hypertension, hyperlipidemia, diastolic heart failure, CAD s/p stent, CKD stage IV, and obesity who presents with complaints of shortness of breath and chest pain starting 2 days ago.  Chest pain is substernal and feels like pressure. He chronically has orthopnea and has a bed that he always sleeps sitting up.  He has been feeling short of breath even when he is not doing anything which is not normal for him.  Denies having any fever, nausea, vomiting, diarrhea, abdominal pain, or diaphoresis.    PT Comments    Patient appearing more alert today. Patient continues to require mod/max assist with bed mobility but demonstrates improving LE mobility with cueing. He requires almost constant support for sitting balance due to weakness and fatigue in seated position. He is able to complete exercises seated EOB in limited range. He requires assist to return to supine at end of session. Patient will benefit from continued skilled physical therapy in hospital and recommended venue below to increase strength, balance, endurance for safe ADLs and gait.   Recommendations for follow up therapy are one component of a multi-disciplinary discharge planning process, led by the attending physician.  Recommendations may be updated based on patient status, additional functional criteria and insurance authorization.  Follow Up Recommendations  Skilled nursing-short term rehab (<3 hours/day) Can patient physically be transported by private vehicle: No   Assistance Recommended at Discharge Frequent or constant Supervision/Assistance  Patient can return home with the following A lot of help with walking and/or transfers;A lot of help with bathing/dressing/bathroom   Equipment Recommendations  None  recommended by PT    Recommendations for Other Services       Precautions / Restrictions Precautions Precautions: Fall Restrictions Weight Bearing Restrictions: No     Mobility  Bed Mobility Overal bed mobility: Needs Assistance Bed Mobility: Supine to Sit, Sit to Supine     Supine to sit: Mod assist, Max assist, HOB elevated Sit to supine: Max assist   General bed mobility comments: labored, cueing required    Transfers                        Ambulation/Gait                   Stairs             Wheelchair Mobility    Modified Rankin (Stroke Patients Only)       Balance Overall balance assessment: Needs assistance Sitting-balance support: No upper extremity supported, Feet supported Sitting balance-Leahy Scale: Poor Sitting balance - Comments: fair/poor seated EOB, requires frequent cueing and almost constant support to maintain Postural control: Posterior lean                                  Cognition Arousal/Alertness: Awake/alert Behavior During Therapy: WFL for tasks assessed/performed Overall Cognitive Status: Within Functional Limits for tasks assessed                                          Exercises General Exercises - Lower Extremity Ankle Circles/Pumps: AROM, Both, 10 reps, Seated Long Arc  Quad: AROM, Both, 10 reps, Seated Hip Flexion/Marching: AROM, Both, 10 reps, Seated    General Comments        Pertinent Vitals/Pain Pain Assessment Pain Assessment: Faces Faces Pain Scale: Hurts little more Pain Location: generalized Pain Descriptors / Indicators: Sore Pain Intervention(s): Limited activity within patient's tolerance, Monitored during session, Repositioned    Home Living                          Prior Function            PT Goals (current goals can now be found in the care plan section) Acute Rehab PT Goals Patient Stated Goal: Go to rehab and go home PT  Goal Formulation: With patient Time For Goal Achievement: 08/15/22 Potential to Achieve Goals: Good Progress towards PT goals: Progressing toward goals    Frequency    Min 3X/week      PT Plan Current plan remains appropriate    Co-evaluation              AM-PAC PT "6 Clicks" Mobility   Outcome Measure  Help needed turning from your back to your side while in a flat bed without using bedrails?: A Lot Help needed moving from lying on your back to sitting on the side of a flat bed without using bedrails?: A Lot Help needed moving to and from a bed to a chair (including a wheelchair)?: Total Help needed standing up from a chair using your arms (e.g., wheelchair or bedside chair)?: Total Help needed to walk in hospital room?: Total Help needed climbing 3-5 steps with a railing? : Total 6 Click Score: 8    End of Session Equipment Utilized During Treatment: Oxygen Activity Tolerance: Patient tolerated treatment well Patient left: in bed;with call bell/phone within reach;with bed alarm set Nurse Communication: Mobility status PT Visit Diagnosis: Unsteadiness on feet (R26.81);Other abnormalities of gait and mobility (R26.89);Muscle weakness (generalized) (M62.81)     Time: 0762-2633 PT Time Calculation (min) (ACUTE ONLY): 14 min  Charges:  $Therapeutic Exercise: 8-22 mins                    8:53 AM, 08/02/22 Mearl Latin PT, DPT Physical Therapist at [facility]

## 2022-08-02 NOTE — Care Management Important Message (Signed)
Important Message  Patient Details  Name: [name] MRN: [id number] Date of Birth: 01/15/1946   Medicare Important Message Given:  Yes     Tommy Medal 08/02/2022, 1:05 PM

## 2022-08-02 NOTE — Progress Notes (Signed)
Critical result for Blood cultures called to Dr. Carles Collet at 1303, regarding gram + bacteria. Notified again regarding patient's pain and nausea.

## 2022-08-02 NOTE — Progress Notes (Signed)
Patient Blood culture results staph epiderm, methicillin resistant per [facility]. Verification notification amion paged to Dr. Carles Collet.

## 2022-08-02 NOTE — Progress Notes (Signed)
Patient reported this morning that he takes oxycodone 4x a day for pain at home. Meds not listed in the data base for review. Called and spoke to his primary medical team at [facility]. Confirmed RX by Dr. [name]. Notified Dr. Carles Collet.

## 2022-08-02 NOTE — Progress Notes (Addendum)
Nephrology Progress Note:  Subjective:   He was moved to the floor in the interim.  Last post void bladder scan yesterday am improved to under 100 mL.  He follows with Dr. Theador Hawthorne and states that they have not discussed dialysis.  He had 1.4 liters UOP over 8/10 after we reduced lasix (had 6.9 liters UOP over 8/8 charted).  He tells me that he would want dialysis if needed though has trouble coming up with the year.  I called his brother who indicated that he had expressed "while wide awake" that he would want dialysis if needed.  Patient states that he feels a lot better.   Review of systems:   Denies overt shortness of breath but still on oxygen  Denies n/v No chest pain    Objective Vital signs in last 24 hours: Vitals:   08/01/22 1800 08/01/22 2128 08/02/22 0606 08/02/22 0626  BP:  (!) 111/49 (!) 147/75   Pulse:  70 71   Resp:  18 17   Temp: (!) 100.6 F (38.1 C) 99.8 F (37.7 C) 97.8 F (36.6 C)   TempSrc: Oral     SpO2:  100% 100%   Weight:    111.1 kg  Height:       Weight change: -1.2 kg  Intake/Output Summary (Last 24 hours) at 08/02/2022 1206 Last data filed at 08/02/2022 0900 Gross per 24 hour  Intake 876.72 ml  Output 1350 ml  Net -473.28 ml    Assessment/Plan: 76 year old BM with DM, HTN, diastolic heart failure,  CAD and CKD-  presenting with SOB and CP  1.AKI- advanced CKD at baseline.  He has had volume overload contributing to respiratory failure.  Post void bladder scan elevated at 190 ml with recheck under 100 ml - There is no acute indication for dialysis and he would want if it were to become needed.  AKI after aggressive diuresis to optimize respiratory status.  Hopeful for plateau - lasix 80 mg IV tomorrow am    1a. CKD stage IV - baseline Cr is in the mid 3's recently  2. Hypertension/volume  - controlled. Volume status improving.   3. Anemia  - has been getting iron and ESA as outpatient.  S/p IV iron and on aranesp 200 mcg every Tuesday   4.  Hypokalemia-  repleted   5. Acute systolic heart failure  cardiology following.  Medical management recommended at this time per charting. Optimize volume status as above  6. Acute hypoxic resp failure - extubated. Optimize volume status above.   Disposition - continue inpatient monitoring  Nephrology will review his chart over the weekend and see if able if issues arise.  Please contact us with any questions or issues     Labs: Basic Metabolic Panel: Recent Labs  Lab 07/31/22 0433 08/01/22 0349 08/02/22 0652  NA 142 141 143  K 3.5 3.3* 4.4  CL 101 98 100  CO2 28 29 28   GLUCOSE 180* 156* 155*  BUN 92* 100* 118*  CREATININE 3.09* 3.36* 4.46*  CALCIUM 9.5 9.2 9.1  PHOS 3.4  --   --    Liver Function Tests: Recent Labs  Lab 07/28/22 2328  AST 41  ALT 39  ALKPHOS 59  BILITOT 0.4  PROT 7.6  ALBUMIN 4.2   No results for input(s): "LIPASE", "AMYLASE" in the last 168 hours. No results for input(s): "AMMONIA" in the last 168 hours. CBC: Recent Labs  Lab 07/28/22 2328 07/30/22 0331 07/31/22 0433 08/02/22  0652  WBC 11.6* 10.4 11.0* 14.1*  NEUTROABS 9.0*  --   --   --   HGB 8.2* 8.0* 9.6* 8.1*  HCT 25.0* 24.8* 30.5* 26.4*  MCV 97.3 98.8 99.3 101.5*  PLT 227 186 227 216   Cardiac Enzymes: No results for input(s): "CKTOTAL", "CKMB", "CKMBINDEX", "TROPONINI" in the last 168 hours. CBG: Recent Labs  Lab 08/01/22 1129 08/01/22 1614 08/01/22 2124 08/02/22 0710 08/02/22 1104  GLUCAP 153* 161* 165* 155* 233*    Iron Studies: No results for input(s): "IRON", "TIBC", "TRANSFERRIN", "FERRITIN" in the last 72 hours. Studies/Results: No results found. Medications: Infusions:  sodium chloride     ferric gluconate (FERRLECIT) IVPB 250 mg (08/02/22 1053)   meropenem (MERREM) IV 1 g (08/02/22 0540)    Scheduled Medications:  acetaminophen  1,000 mg Oral Q6H   allopurinol  100 mg Oral Daily   aspirin  81 mg Oral Daily   atorvastatin  80 mg Oral Daily   carvedilol   6.25 mg Oral BID WC   Chlorhexidine Gluconate Cloth  6 each Topical Q0600   cloNIDine  0.1 mg Oral BID   darbepoetin (ARANESP) injection - NON-DIALYSIS  200 mcg Subcutaneous Q Tue-1800   feeding supplement  237 mL Oral BID BM   heparin  5,000 Units Subcutaneous Q8H   hydrALAZINE  100 mg Oral TID   insulin aspart  0-5 Units Subcutaneous QHS   insulin aspart  0-9 Units Subcutaneous TID WC   isosorbide mononitrate  120 mg Oral Daily   pantoprazole  40 mg Oral Daily   sodium chloride flush  3 mL Intravenous Q12H    have reviewed scheduled and prn medications.  Physical exam:    General adult male in bed in no acute distress HEENT normocephalic atraumatic extraocular movements intact sclera anicteric Neck supple trachea midline Lungs clear but reduced breath sounds; on 2 liters  Heart S1S2 no rub Abdomen soft nontender nondistended Extremities trace to 1+ edema present but improved; chronic trophic changes Neuro - awake and interactive; he is oriented to person and location but states on four occasions that it is "03" then clarifying "2003" GU - external catheter in place   Claudia Desanctis, MD 08/02/2022 12:06 PM  LOS: 4 days

## 2022-08-02 NOTE — TOC Initial Note (Signed)
Transition of Care [facility]) - Initial/Assessment Note    Patient Details  Name: GLEN KESINGER MRN: [id number] Date of Birth: 10-14-1946  Transition of Care [facility]) CM/SW Contact:    Shade Flood, LCSW Phone Number: 08/02/2022, 2:25 PM  Clinical Narrative:                  Pt from home. PT recommending SNF rehab at dc. Spoke with pt and family today to assess and review dc planning. Pt and family agreeable to SNF referrals. CMS provider options reviewed. Will refer as requested. MD anticipating dc early next week.  Pt will need insurance authorization. TOC will follow.  Expected Discharge Plan: Skilled Nursing Facility Barriers to Discharge: Continued Medical Work up   Patient Goals and CMS Choice Patient states their goals for this hospitalization and ongoing recovery are:: get better CMS Medicare.gov Compare Post Acute Care list provided to:: Patient Represenative (must comment) Choice offered to / list presented to : Sibling  Expected Discharge Plan and Services Expected Discharge Plan: [place] In-house Referral: Clinical Social Work   Post Acute Care Choice: [place] Living arrangements for the past 2 months: [place]                                      Prior Living Arrangements/Services Living arrangements for the past 2 months: Single Family Home Lives with:: Self Patient language and need for interpreter reviewed:: Yes Do you feel safe going back to the place where you live?: Yes      Need for Family Participation in Patient Care: Yes (Comment) Care giver support system in place?: Yes (comment) Current home services: DME Criminal Activity/Legal Involvement Pertinent to Current Situation/Hospitalization: No - Comment as needed  Activities of Daily Living Home Assistive Devices/Equipment: Cane (specify quad or straight) ADL Screening (condition at time of admission) Patient's cognitive ability adequate to safely  complete daily activities?: Yes Is the patient deaf or have difficulty hearing?: No Does the patient have difficulty seeing, even when wearing glasses/contacts?: No Does the patient have difficulty concentrating, remembering, or making decisions?: Yes Patient able to express need for assistance with ADLs?: No Does the patient have difficulty dressing or bathing?: Yes Independently performs ADLs?: No Communication: Independent Dressing (OT): Needs assistance Is this a change from baseline?: Pre-admission baseline Grooming: Needs assistance Is this a change from baseline?: Pre-admission baseline Feeding: Independent Bathing: Needs assistance Is this a change from baseline?: Pre-admission baseline Toileting: Needs assistance Is this a change from baseline?: Pre-admission baseline In/Out Bed: Needs assistance Is this a change from baseline?: Pre-admission baseline Walks in Home: Needs assistance Is this a change from baseline?: Pre-admission baseline Does the patient have difficulty walking or climbing stairs?: Yes Weakness of Legs: Both Weakness of Arms/Hands: None  Permission Sought/Granted Permission sought to share information with : Chartered certified accountant granted to share information with : Yes, Verbal Permission Granted     Permission granted to share info w AGENCY: [facility]        Emotional Assessment Appearance:: Appears stated age     Orientation: : Oriented to Self, Oriented to Place, Oriented to Situation Alcohol / Substance Use: Not Applicable Psych Involvement: No (comment)  Admission diagnosis:  (HFpEF) heart failure with preserved ejection fraction (HCC) [I50.30] Acute respiratory failure with hypoxia ([place]) [J96.01] Patient Active Problem List   Diagnosis Date Noted   HFrEF (heart  failure with reduced ejection fraction) (HCC)    (HFpEF) heart failure with preserved ejection fraction ([place]) 07/29/2022   Hypokalemia 07/29/2022   Frequent PVCs  07/29/2022   Acute respiratory failure with hypoxia ([place])    B12 deficiency 07/18/2022   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/2022   Diabetic neuropathy ([place]) 05/10/2022   Elevated troponin 05/10/2022   Type 2 diabetes mellitus with hyperglycemia ([place]) 05/10/2022   Other intervertebral disc degeneration, lumbar region 02/06/2022   Chronic gout, unspecified, 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ophi) 02/06/2022   Body mass index (BMI) 35.0-35.9, adult 02/06/2022   Obesity 02/06/2022   Primary generalized (osteo)arthritis 02/06/2022   Chest pain 02/06/2022   Polyneuropathy 10/29/2021   Bilateral lower extremity edema 08/28/2021   Other chronic pain 08/28/2021   Lumbago with sciatica, right side 08/28/2021   Weakness of both lower extremities 08/28/2021   Body mass index (BMI) 33.0-33.9, adult 08/28/2021   Constipation 02/15/2021   Abdominal pain 01/02/2021   Diabetes mellitus ([place]) 11/09/2020   Vertigo 11/09/2020   Hypertensive disorder 11/09/2020   Hypercholesterolemia 11/09/2020   H/O adenomatous polyp of colon 05/05/2020   Iron deficiency anemia 05/05/2020   Chronic kidney disease, stage IV (severe) ([place]) 12/14/2019   Acute gouty arthritis 10/25/2019   Sepsis ([place]) 04/02/2019   Leukocytosis 04/02/2019   Cellulitis of right hand 04/01/2019   Osteoarthritis of ankle and foot 02/08/2019   Peripheral edema    CHF (congestive heart failure) ([place]) 07/05/2015   Acute congestive heart failure ([place]) 07/04/2015   Acute on chronic diastolic congestive heart failure ([place]) 07/04/2015   Mixed hyperlipidemia 07/04/2015   Anxiety state 07/04/2015   GERD (gastroesophageal reflux disease) 07/04/2015   Heart block 07/04/2015   Gastroesophageal reflux disease 07/04/2015   Edema 03/00/9233   Diastolic CHF, chronic (HCC) 03/11/2015   Chronic diastolic heart failure ([place]) 03/11/2015   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   Muscle weakness (generalized) 01/13/2014   Knee stiffness 01/13/2014   Gout 12/11/2013    Cellulitis 11/19/2013   Expected blood loss anemia 11/17/2013   Obese 11/17/2013   S/P right TKA 11/15/2013   Anemia, normocytic normochromic 01/12/2013   Normocytic normochromic anemia 01/12/2013   Acute kidney injury superimposed on chronic kidney disease ([place]) 12/10/2012   Diabetes mellitus type 2 in obese ([place]) 12/10/2012   CAD S/P percutaneous coronary angioplasty 11/24/2012   Overweight(278.02) 11/17/2012   Obstructive sleep apnea 11/15/2012   Chronic kidney disease, stage 3, cardiorenal syndrome 08/23/2011   Chronic kidney disease, stage 3 unspecified ([place]) 08/23/2011   Essential hypertension    PCP:  Redmond School, MD Pharmacy:   [facility], [address] [place] [address] Phone: [phone] Fax: [phone]     Social Determinants of Health (SDOH) Interventions    Readmission Risk Interventions    08/02/2022    2:23 PM  Readmission Risk Prevention Plan  Transportation Screening Complete  Medication Review (RN Care Manager) Complete  HRI or Home Care Consult Complete  SW Recovery Care/Counseling Consult Complete  Palliative Care Screening Not Applicable  Skilled Nursing Facility Complete

## 2022-08-03 DIAGNOSIS — D649 Anemia, unspecified: Secondary | ICD-10-CM | POA: Diagnosis not present

## 2022-08-03 DIAGNOSIS — I5033 Acute on chronic diastolic (congestive) heart failure: Secondary | ICD-10-CM | POA: Diagnosis not present

## 2022-08-03 DIAGNOSIS — J9601 Acute respiratory failure with hypoxia: Secondary | ICD-10-CM | POA: Diagnosis not present

## 2022-08-03 LAB — RENAL FUNCTION PANEL
Albumin: 2.9 g/dL — ABNORMAL LOW (ref 3.5–5.0)
Anion gap: 15 (ref 5–15)
BUN: 139 mg/dL — ABNORMAL HIGH (ref 8–23)
CO2: 27 mmol/L (ref 22–32)
Calcium: 9.1 mg/dL (ref 8.9–10.3)
Chloride: 102 mmol/L (ref 98–111)
Creatinine, Ser: 4.9 mg/dL — ABNORMAL HIGH (ref 0.61–1.24)
GFR, Estimated: 12 mL/min — ABNORMAL LOW (ref >60)
Glucose, Bld: 224 mg/dL — ABNORMAL HIGH (ref 70–99)
Phosphorus: 5.6 mg/dL — ABNORMAL HIGH (ref 2.5–4.6)
Potassium: 4.4 mmol/L (ref 3.5–5.1)
Sodium: 144 mmol/L (ref 135–145)

## 2022-08-03 LAB — GLUCOSE, CAPILLARY
Glucose-Capillary: 153 mg/dL — ABNORMAL HIGH (ref 70–99)
Glucose-Capillary: 205 mg/dL — ABNORMAL HIGH (ref 70–99)
Glucose-Capillary: 170 mg/dL — ABNORMAL HIGH (ref 70–99)
Glucose-Capillary: 162 mg/dL — ABNORMAL HIGH (ref 70–99)

## 2022-08-03 MED ORDER — VANCOMYCIN HCL 1500 MG/300ML IV SOLN
1500.0000 mg | INTRAVENOUS | Status: DC
  Administered 2022-08-05: 1500 mg via INTRAVENOUS
  Filled 2022-08-03: qty 300

## 2022-08-03 MED ORDER — SODIUM CHLORIDE 0.9 % IV SOLN
1.0000 g | Freq: Two times a day (BID) | INTRAVENOUS | Status: DC
  Administered 2022-08-04: 1 g via INTRAVENOUS
  Filled 2022-08-03 (×3): qty 10

## 2022-08-03 MED ORDER — SODIUM CHLORIDE 0.9 % IV SOLN
2.0000 g | Freq: Once | INTRAVENOUS | Status: AC
  Administered 2022-08-03: 2 g via INTRAVENOUS
  Filled 2022-08-03: qty 12.5

## 2022-08-03 MED ORDER — VANCOMYCIN HCL 2000 MG/400ML IV SOLN
2000.0000 mg | Freq: Once | INTRAVENOUS | Status: AC
  Administered 2022-08-03: 2000 mg via INTRAVENOUS
  Filled 2022-08-03: qty 400

## 2022-08-03 NOTE — Progress Notes (Signed)
Pharmacy Antibiotic Note  [name] a 76 y.o. male admitted on 08/03/2022 with bacteremia.  Pharmacy has been consulted for vancomycin dosing.  Plan:vancomycin 2gm iv x 1 loading dose  Vancomycin 1574m IV every 48 hours.  Goal trough 15-20 mcg/mL.  Medical History: Past Medical History:  Diagnosis Date   Anginal pain ([place]    Anxiety    Benign prostatic hypertrophy    Nocturia   CAD (coronary artery disease)    a.  NSTEMI 10/2012 s/p DES to LAD & DES to 1st diagonal with residual diffuse nonobstructive dz in LCx/RCA    Chronic kidney disease, stage 3, mod decreased GFR (HCC)    Creatinine of 1.5 in 03/2009, STAGE 4 now   Degenerative joint disease    Depression    hx of   Diabetes mellitus, type II ([place] 149/44/9675  Diastolic heart failure    Pedal edema, "a long time ago"   Erectile dysfunction    Flu 2013   hx of   GERD (gastroesophageal reflux disease)    Gout    Hemorrhoids    High cholesterol    Hypertension    Exercise induced   Myocardial infarction ([place]    Neuropathy    bilateral legs   NSVT (nonsustained ventricular tachycardia) ([place]    Exercise induced   Obesity    Tobacco abuse, 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, quit 1994   Tubular adenoma 2014   Vertigo    everyday    Allergies:  Allergies  Allergen Reactions   Metformin Nausea And Vomiting   Niacin    Niacin And Related Other (See Comments)    Reaction: flushing and burning side effects even with extended release    Filed Weights   08/01/22 0632 08/02/22 0626 08/03/22 0412  Weight: 112.3 kg (247 lb 9.2 oz) 111.1 kg (244 lb 14.9 oz) 110.8 kg (244 lb 4.3 oz)       Latest Ref Rng & Units 08/02/2022    6:52 AM 07/31/2022    4:33 AM 07/30/2022    3:31 AM  CBC  WBC 4.0 - 10.5 K/uL 14.1  11.0  10.4   Hemoglobin 13.0 - 17.0 g/dL 8.1  9.6  8.0   Hematocrit 39.0 - 52.0 % 26.4  30.5  24.8   Platelets 150 - 400 K/uL 216  227  186      Estimated Creatinine Clearance: 18.4 mL/min (A) (by C-G  formula based on SCr of 4.46 mg/dL (H)).  Antibiotics Given (last 72 hours)     Date/Time Action Medication Dose Rate   08/01/22 1744 New Bag/Given   meropenem (MERREM) 1 g in sodium chloride 0.9 % 100 mL IVPB 1 g 200 mL/hr   08/02/22 0540 New Bag/Given   meropenem (MERREM) 1 g in sodium chloride 0.9 % 100 mL IVPB 1 g 200 mL/hr   08/02/22 1820 New Bag/Given   meropenem (MERREM) 1 g in sodium chloride 0.9 % 100 mL IVPB 1 g 200 mL/hr   08/03/22 0412 New Bag/Given   meropenem (MERREM) 1 g in sodium chloride 0.9 % 100 mL IVPB 1 g 200 mL/hr       Antimicrobials this admission:  vancomycin 08/03/2022  >>  Merrem 08/01/2022 >> 08/03/2022  Microbiology results: 08/01/2022  BCx: MRSA 2/4 bottles 1 set  08/01/2022  UCx: sent 07/29/2022  MRSA PCR: negative   Thank you for allowing pharmacy to be a part of this patient's care.  GThomasenia Sales PharmD  Clinical Pharmacist

## 2022-08-03 NOTE — Progress Notes (Signed)
Patient family member at bedside during this evening's bedside shift report. Patient told visitor "tell the ladies about my mouth". Patient visitor stated that patient was starting to complain of what felt like drainage in his mouth, and that patient had recently had oral surgery for which he was prescribed antibiotics, but by patient's own admission, didn't complete. Notified night shift covering hospital physician team per [facility] information.

## 2022-08-03 NOTE — Progress Notes (Signed)
Patient declined to eat, did drink a bit of ensure. Nausea still pretty prevalent. Patient requested pain meds this am, notified patient that I could not give him anything at this time as per ordered parameters, but could come back within the hour. Expressed understanding and agreeable. Patient was repositioned in the bed and assisted with maneuvering. Call bell is within reach of patient. Lights dimmed for comfort.

## 2022-08-03 NOTE — Progress Notes (Signed)
Progress Note   Patient: [name] VZD:[id number] DOB: 09-22-1946 DOA: 07/28/2022     5 DOS: the patient was seen and examined on 08/03/2022   Brief hospital course:  76 y.o. male with medical history significant of hypertension, hyperlipidemia, diastolic heart failure, CAD s/p stent, CKD stage IV, and obesity who presents with complaints of shortness of breath and chest pain starting 2 days ago.  Chest pain is substernal and feels like pressure. He chronically has orthopnea and has a bed that he always sleeps sitting up.  He has been feeling short of breath even when he is not doing anything which is not normal for him.  Denies having any fever, nausea, vomiting, diarrhea, abdominal pain, or diaphoresis.   Patient reports that he is followed by Dr. Harl Bowie of cardiology and at the end of June had been recommended to increase his Imdur to 60 mg twice daily.  He states that this initially helped decrease symptoms of chest pain.  Then he was told to start taking furosemide 40 mg twice daily by his nephrologist Dr. Theador Hawthorne on 6/12 and was started on torsemide 100 mg daily.  Since doing that patient reports that he has been swelling more.  He developed a blister on his left leg due to the swelling and he has been putting ointment on it.  Denies having any redness or erythema from the wound.   Upon admission into the emergency patient was seen to be afebrile with blood pressures 140/82 to 158/73 and all other vital signs maintained.  Labs significant for WBC 11.6, hemoglobin 8.2, potassium 3.2, BUN 74, creatinine 3.38, glucose 170, BNP 651, and high-sensitivity troponin 42. Chest x-ray showed cardiomegaly with vascular congestion.  Patient has been given Lasix 40 mg IV, potassium chloride 40 mEq, and magnesium oxide 800 mg p.o.  07/29/2022: Pt decompensated on med surg unit and rapid response called and patient placed on bipap and transferred to stepdown ICU. Cardiology and nephrology consulted and patient  started on maximal dose IV lasix by nephrology.  Pt noted to be in atrial fibrillation.  Family was called by MD and updated with change in status and treatment plan.    07/30/2022: Pt was intubated on 8/7 after failed bipap trial.  Afib converted to NSR.  He has diuresed >3L on high dose lasix therapy.    08/02/22--pt requested opioids.  After verifying with PCP pt has been receiving chronic norco, it was restarted.  Continues to have nausea--suspect partly uremia from renal failure.   Start zofran OTC.  Reglan IV x 1  Assessment and Plan: Acute respiratory failure with hypoxia  - secondary to acute heart failure exacerbation and atrial fibrillation - Trial of bipap failed and patient intubated by PCCM Dr. Halford Chessman.  - IV lasix maximal doses ordered per nephrology with good diuresis noted - pulmonary consult appreciated - nephrology consultation appreciated - PCCM consultation appreciated - extubated 8/9>>stable on 4L>>2L   Acute HFrEF - he was massively volume overloaded at presentation -IV lasix 120 mg q 12 ordered by nephrology>>paused after 8/10 am dose - further lasix dosing per nephrology - limited echo 8/23: LVEF 35-40%, LV global hypokinesis - weight trending down with diuresis  - follow I/O (now neg 13.5L) - cardiology consultation appreciated>>not cath candidate   Acute on chronic CKD Stage IV CKD - appreciate nephrology consultation  - baseline creatinine 3.0-3.3 - he agrees to HD if he should needed it -- discussed with pt and brother 8/9 and 8/11, and 8/12 -  8/11 uptrend in serum creatinine to 4.46   Fever resolved -new fevere 101.0 8/10 afternoon -8/10 blood cultures x2>>GPC in 1 of 2 sets -await BCID before starting abx as suspect this is contaminant -Blood culture + MRSA, start vanco iv pharmacy to dose 8/12   Hypertensive urgency - resume home meds and IV hydralazine ordered - Icontinue coreg, clonidine, hydralazine, imdur - soft BPs noted on precedex, agree with  nephrology, changed BP meds to PRN to avoid prolonged low BP   Opioid Dependence/chronic back pain -PDMP does not show any recent opioid Rx in last 2 years -verified with PCP office pt is on norco 5/325 q 6 hrs prn>>restart   Chest pain / longstanding angina / CAD - appreciate cardiology consult s/p DES to LAD and D1 in 10/2012 with nonobstructive disease along RCA and LCx. Most recent ischemic evaluation was a low-risk NST in 04/2022 - he is being treated medically with ASA 81 mg, coreg, pravastatin - not cath candidate due to CKD   Leukocytosis  - stress reaction likely  - follow, no s/s of infection found at this time    Hypokalemia  - repleted - mag 2.1   Essential hypertension  - resumed home carvedilol, clonidine, isosorbide, hydralazine   Diabetes mellitus, type 2, controlled with renal complications - continue close CBG monitoring and SSI coverage    Paroxysmal Atrial Fibrillation  - new diagnosis this admission -  His CHA2DS2-VASc Score is at least 6. Has not yet been started on anticoagulation, likely due to his chronic anemia  -Unless noted to have definitive atrial fibrillation, would prefer not to commit to long-term anticoagulation.  -cardiology following   Anemia of chronic renal disease - low threshold to transfuse for Hg<8 as it contributes to chest pain and respiratory distress    GERD  - protonix for GI protection    Hyperlipidemia - resume statin   Gout  - continue allopurinolol  - watch very closely in setting of aggressive diuresis and CKD    Obesity class I - BMI 33.88 - lifestyle modification       Procedures: As Listed in Progress Note Above   Antibiotics: None    Subjective: just feels washed out. Slight nausea, no emesis.   Otherwise, pt denies fever, chills,  cough, cp, palp, sob, abd pain, diarrhea, brbpr, dysuria, hematuria.    Physical Exam: Vitals:   08/02/22 1944 08/02/22 2130 08/03/22 0357 08/03/22 0412  BP: (!) 116/42  (!) 139/53 (!) 141/49   Pulse: 71 70 83   Resp: 20  18   Temp: 98.9 F (37.2 C)  98.9 F (37.2 C)   TempSrc: Oral     SpO2: 92% 99% 98%   Weight:    110.8 kg  Height:       Gen: axox3 Heent: anicteric Neck: no jvd Heart: rrr s1, s2, no m/g/r Lung: CTAB Abd: soft, nt, nd, +bs Ext: trace edema,  Skin: dry, + venous stasis,  onychomycosis Lymph: no adenopathy Neuro: nonfocal  Data Reviewed: Wbc 14.1, hgb 8.1, Plt 216 Na 143, K 4.4 Bun 118, Creatinine 4.46  Code Status:  FULL CODE Pt amenable to dialysis   Consultants:  renal, pulm, cardiology      DVT Prophylaxis:  [place] Heparin    Family Communication:  w brother [name] 8/12  Disposition: Status is: Inpatient   Planned Discharge Destination:  Home  Time spent: 30 minutes  Author: Jani Gravel, MD 08/03/2022 10:55 AM  For on call review [email or web address].

## 2022-08-03 NOTE — Progress Notes (Signed)
Pharmacy Antibiotic Note  [name] a 76 y.o. male admitted on 07/29/22 with UTI.  Pharmacy has been consulted for cefepime dosing.  Plan: Cefepime 1gm IV every 12 hours  Medical History: Past Medical History:  Diagnosis Date   Anginal pain ([place])    Anxiety    Benign prostatic hypertrophy    Nocturia   CAD (coronary artery disease)    a.  NSTEMI 10/2012 s/p DES to LAD & DES to 1st diagonal with residual diffuse nonobstructive dz in LCx/RCA    Chronic kidney disease, stage 3, mod decreased GFR (HCC)    Creatinine of 1.5 in 03/2009, STAGE 4 now   Degenerative joint disease    Depression    hx of   Diabetes mellitus, type II ([place]) 40/09/2724   Diastolic heart failure    Pedal edema, "a long time ago"   Erectile dysfunction    Flu 2013   hx of   GERD (gastroesophageal reflux disease)    Gout    Hemorrhoids    High cholesterol    Hypertension    Exercise induced   Myocardial infarction ([place])    Neuropathy    bilateral legs   NSVT (nonsustained ventricular tachycardia) ([place])    Exercise induced   Obesity    Tobacco abuse, 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, quit 1994   Tubular adenoma 2014   Vertigo    everyday    Allergies:  Allergies  Allergen Reactions   Metformin Nausea And Vomiting   Niacin    Niacin And Related Other (See Comments)    Reaction: flushing and burning side effects even with extended release    Filed Weights   08/01/22 0632 08/02/22 0626 08/03/22 0412  Weight: 112.3 kg (247 lb 9.2 oz) 111.1 kg (244 lb 14.9 oz) 110.8 kg (244 lb 4.3 oz)       Latest Ref Rng & Units 08/02/2022    6:52 AM 07/31/2022    4:33 AM 07/30/2022    3:31 AM  CBC  WBC 4.0 - 10.5 K/uL 14.1  11.0  10.4   Hemoglobin 13.0 - 17.0 g/dL 8.1  9.6  8.0   Hematocrit 39.0 - 52.0 % 26.4  30.5  24.8   Platelets 150 - 400 K/uL 216  227  186      Estimated Creatinine Clearance: 16.7 mL/min (A) (by C-G formula based on SCr of 4.9 mg/dL (H)).  Antibiotics Given (last 72 hours)      Date/Time Action Medication Dose Rate   08/01/22 1744 New Bag/Given   meropenem (MERREM) 1 g in sodium chloride 0.9 % 100 mL IVPB 1 g 200 mL/hr   08/02/22 0540 New Bag/Given   meropenem (MERREM) 1 g in sodium chloride 0.9 % 100 mL IVPB 1 g 200 mL/hr   08/02/22 1820 New Bag/Given   meropenem (MERREM) 1 g in sodium chloride 0.9 % 100 mL IVPB 1 g 200 mL/hr   08/03/22 0412 New Bag/Given   meropenem (MERREM) 1 g in sodium chloride 0.9 % 100 mL IVPB 1 g 200 mL/hr   08/03/22 0919 New Bag/Given   vancomycin (VANCOREADY) IVPB 2000 mg/400 mL 2,000 mg 200 mL/hr       Antimicrobials this admission:  Cefepime 08/03/2022  >>  Merrem 8/10 >> 8/12  Microbiology results: 08/03/2022  BCx: 2 bottles 1 set staph epidermidis and staph homininus, meca resistance detected  08/01/2022  UCx: >100k pseudomonas 08/03/2022  Resp Panel: sent  08/03/2022  MRSA PCR:  sent  Thank you for allowing pharmacy to be a part of this patient's care.  Thomasenia Sales, PharmD Clinical Pharmacist

## 2022-08-03 NOTE — Plan of Care (Signed)
  Problem: Education: Goal: Knowledge of General Education information will improve Description: Including pain rating scale, medication(s)/side effects and non-pharmacologic comfort measures Outcome: Progressing   Problem: Health Behavior/Discharge Planning: Goal: Ability to manage health-related needs will improve Outcome: Progressing   Problem: Clinical Measurements: Goal: Ability to maintain clinical measurements within normal limits will improve Outcome: Progressing Goal: Will remain free from infection Outcome: Progressing Goal: Diagnostic test results will improve Outcome: Progressing Goal: Respiratory complications will improve Outcome: Progressing Goal: Cardiovascular complication will be avoided Outcome: Progressing   Problem: Activity: Goal: Risk for activity intolerance will decrease Outcome: Progressing   Problem: Nutrition: Goal: Adequate nutrition will be maintained Outcome: Progressing   Problem: Coping: Goal: Level of anxiety will decrease Outcome: Progressing   Problem: Elimination: Goal: Will not experience complications related to bowel motility Outcome: Progressing Goal: Will not experience complications related to urinary retention Outcome: Progressing   Problem: Pain Managment: Goal: General experience of comfort will improve Outcome: Progressing   Problem: Safety: Goal: Ability to remain free from injury will improve Outcome: Progressing   Problem: Skin Integrity: Goal: Risk for impaired skin integrity will decrease Outcome: Progressing   Problem: Education: Goal: Ability to describe self-care measures that may prevent or decrease complications (Diabetes Survival Skills Education) will improve Outcome: Progressing Goal: Individualized Educational Video(s) Outcome: Progressing   Problem: Coping: Goal: Ability to adjust to condition or change in health will improve Outcome: Progressing   Problem: Fluid Volume: Goal: Ability to  maintain a balanced intake and output will improve Outcome: Progressing   Problem: Health Behavior/Discharge Planning: Goal: Ability to identify and utilize available resources and services will improve Outcome: Progressing Goal: Ability to manage health-related needs will improve Outcome: Progressing   Problem: Metabolic: Goal: Ability to maintain appropriate glucose levels will improve Outcome: Progressing   Problem: Nutritional: Goal: Maintenance of adequate nutrition will improve Outcome: Progressing Goal: Progress toward achieving an optimal weight will improve Outcome: Progressing   Problem: Skin Integrity: Goal: Risk for impaired skin integrity will decrease Outcome: Progressing   Problem: Tissue Perfusion: Goal: Adequacy of tissue perfusion will improve Outcome: Progressing   Problem: Education: Goal: Individualized Educational Video(s) Outcome: Progressing   Problem: Activity: Goal: Capacity to carry out activities will improve Outcome: Progressing   Problem: Cardiac: Goal: Ability to achieve and maintain adequate cardiopulmonary perfusion will improve Outcome: Progressing

## 2022-08-03 NOTE — Progress Notes (Signed)
Patient has had several visitors throughout shift, and patient condition updates given to daughter Blanch Media via the phone. Patient has been in pain throughout shift, with meds given as available to assist. Currently, patient is resting quietly with eyes closed, resp even and non labored, arousal to stimulus and speaks appropriately to questions. Alert and oriented. Has spoken today about his old love of golf, and attempts to reposition have been met with significant pain barrier. Patient has ate little, but has allowed for gentle oral hydration throughout myself by myself and his CNA, Jarrett Soho.

## 2022-08-04 DIAGNOSIS — J9601 Acute respiratory failure with hypoxia: Secondary | ICD-10-CM | POA: Diagnosis not present

## 2022-08-04 DIAGNOSIS — D649 Anemia, unspecified: Secondary | ICD-10-CM | POA: Diagnosis not present

## 2022-08-04 DIAGNOSIS — I5033 Acute on chronic diastolic (congestive) heart failure: Secondary | ICD-10-CM | POA: Diagnosis not present

## 2022-08-04 DIAGNOSIS — N184 Chronic kidney disease, stage 4 (severe): Secondary | ICD-10-CM | POA: Diagnosis not present

## 2022-08-04 LAB — CBC
HCT: 23.2 % — ABNORMAL LOW (ref 39.0–52.0)
Hemoglobin: 7.3 g/dL — ABNORMAL LOW (ref 13.0–17.0)
MCH: 32 pg (ref 26.0–34.0)
MCHC: 31.5 g/dL (ref 30.0–36.0)
MCV: 101.8 fL — ABNORMAL HIGH (ref 80.0–100.0)
Platelets: 257 10*3/uL (ref 150–400)
RBC: 2.28 MIL/uL — ABNORMAL LOW (ref 4.22–5.81)
RDW: 16.8 % — ABNORMAL HIGH (ref 11.5–15.5)
WBC: 11.2 10*3/uL — ABNORMAL HIGH (ref 4.0–10.5)
nRBC: 0 % (ref 0.0–0.2)

## 2022-08-04 LAB — URINE CULTURE: Culture: >=100000 — AB

## 2022-08-04 LAB — RENAL FUNCTION PANEL
Albumin: 2.7 g/dL — ABNORMAL LOW (ref 3.5–5.0)
Anion gap: 14 (ref 5–15)
BUN: 146 mg/dL — ABNORMAL HIGH (ref 8–23)
CO2: 27 mmol/L (ref 22–32)
Calcium: 9.4 mg/dL (ref 8.9–10.3)
Chloride: 105 mmol/L (ref 98–111)
Creatinine, Ser: 4.95 mg/dL — ABNORMAL HIGH (ref 0.61–1.24)
GFR, Estimated: 11 mL/min — ABNORMAL LOW (ref >60)
Glucose, Bld: 166 mg/dL — ABNORMAL HIGH (ref 70–99)
Phosphorus: 5.9 mg/dL — ABNORMAL HIGH (ref 2.5–4.6)
Potassium: 4.6 mmol/L (ref 3.5–5.1)
Sodium: 146 mmol/L — ABNORMAL HIGH (ref 135–145)

## 2022-08-04 LAB — COMPREHENSIVE METABOLIC PANEL
ALT: 12 U/L (ref 0–44)
AST: 14 U/L — ABNORMAL LOW (ref 15–41)
Albumin: 2.7 g/dL — ABNORMAL LOW (ref 3.5–5.0)
Alkaline Phosphatase: 54 U/L (ref 38–126)
Anion gap: 15 (ref 5–15)
BUN: 148 mg/dL — ABNORMAL HIGH (ref 8–23)
CO2: 27 mmol/L (ref 22–32)
Calcium: 9.4 mg/dL (ref 8.9–10.3)
Chloride: 105 mmol/L (ref 98–111)
Creatinine, Ser: 5.1 mg/dL — ABNORMAL HIGH (ref 0.61–1.24)
GFR, Estimated: 11 mL/min — ABNORMAL LOW (ref >60)
Glucose, Bld: 166 mg/dL — ABNORMAL HIGH (ref 70–99)
Potassium: 4.7 mmol/L (ref 3.5–5.1)
Sodium: 147 mmol/L — ABNORMAL HIGH (ref 135–145)
Total Bilirubin: 0.6 mg/dL (ref 0.3–1.2)
Total Protein: 6.9 g/dL (ref 6.5–8.1)

## 2022-08-04 LAB — CULTURE, BLOOD (ROUTINE X 2): Special Requests: ADEQUATE

## 2022-08-04 LAB — GLUCOSE, CAPILLARY
Glucose-Capillary: 146 mg/dL — ABNORMAL HIGH (ref 70–99)
Glucose-Capillary: 187 mg/dL — ABNORMAL HIGH (ref 70–99)
Glucose-Capillary: 216 mg/dL — ABNORMAL HIGH (ref 70–99)
Glucose-Capillary: 272 mg/dL — ABNORMAL HIGH (ref 70–99)

## 2022-08-04 LAB — HEMOGLOBIN AND HEMATOCRIT, BLOOD
HCT: 27 % — ABNORMAL LOW (ref 39.0–52.0)
Hemoglobin: 8.5 g/dL — ABNORMAL LOW (ref 13.0–17.0)

## 2022-08-04 LAB — PREPARE RBC (CROSSMATCH)

## 2022-08-04 MED ORDER — DIPHENHYDRAMINE HCL 50 MG/ML IJ SOLN
25.0000 mg | Freq: Once | INTRAMUSCULAR | Status: AC
  Administered 2022-08-04: 25 mg via INTRAVENOUS
  Filled 2022-08-04: qty 1

## 2022-08-04 MED ORDER — DOCUSATE SODIUM 100 MG PO CAPS
100.0000 mg | ORAL_CAPSULE | Freq: Every day | ORAL | Status: AC
  Administered 2022-08-04 – 2022-08-06 (×3): 100 mg via ORAL
  Filled 2022-08-04 (×3): qty 1

## 2022-08-04 MED ORDER — TRAMADOL HCL 50 MG PO TABS
50.0000 mg | ORAL_TABLET | Freq: Four times a day (QID) | ORAL | Status: DC | PRN
  Administered 2022-08-04 – 2022-08-09 (×6): 50 mg via ORAL
  Filled 2022-08-04 (×6): qty 1

## 2022-08-04 MED ORDER — SODIUM CHLORIDE 0.9 % IV SOLN
1.0000 g | INTRAVENOUS | Status: DC
  Administered 2022-08-06 – 2022-08-10 (×5): 1 g via INTRAVENOUS
  Filled 2022-08-04 (×6): qty 10

## 2022-08-04 MED ORDER — PREDNISONE 20 MG PO TABS
20.0000 mg | ORAL_TABLET | Freq: Every day | ORAL | Status: DC
  Administered 2022-08-04 – 2022-08-07 (×3): 20 mg via ORAL
  Filled 2022-08-04 (×4): qty 1

## 2022-08-04 MED ORDER — ACETAMINOPHEN 325 MG PO TABS: 650.0000 mg | ORAL_TABLET | Freq: Once | ORAL | Status: DC

## 2022-08-04 MED ORDER — FUROSEMIDE 10 MG/ML IJ SOLN
40.0000 mg | Freq: Once | INTRAMUSCULAR | Status: AC
  Administered 2022-08-04: 40 mg via INTRAVENOUS
  Filled 2022-08-04: qty 4

## 2022-08-04 MED ORDER — SODIUM CHLORIDE 0.9% IV SOLUTION
Freq: Once | INTRAVENOUS | Status: AC
  Administered 2022-08-06: 1000 mL via INTRAVENOUS

## 2022-08-04 NOTE — Progress Notes (Signed)
Pharmacy Antibiotic Note  [name] a 76 y.o. male admitted on 07/29/22 with UTI.  Pharmacy has been consulted for cefepime dosing.  Plan: Cefepime 1gm IV every 24 hours  Medical History: Past Medical History:  Diagnosis Date   Anginal pain ([place])    Anxiety    Benign prostatic hypertrophy    Nocturia   CAD (coronary artery disease)    a.  NSTEMI 10/2012 s/p DES to LAD & DES to 1st diagonal with residual diffuse nonobstructive dz in LCx/RCA    Chronic kidney disease, stage 3, mod decreased GFR (HCC)    Creatinine of 1.5 in 03/2009, STAGE 4 now   Degenerative joint disease    Depression    hx of   Diabetes mellitus, type II ([place]) 22/63/3354   Diastolic heart failure    Pedal edema, "a long time ago"   Erectile dysfunction    Flu 2013   hx of   GERD (gastroesophageal reflux disease)    Gout    Hemorrhoids    High cholesterol    Hypertension    Exercise induced   Myocardial infarction ([place])    Neuropathy    bilateral legs   NSVT (nonsustained ventricular tachycardia) ([place])    Exercise induced   Obesity    Tobacco abuse, 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, quit 1994   Tubular adenoma 2014   Vertigo    everyday    Allergies:  Allergies  Allergen Reactions   Metformin Nausea And Vomiting   Niacin    Niacin And Related Other (See Comments)    Reaction: flushing and burning side effects even with extended release    Filed Weights   08/02/22 0626 08/03/22 0412 08/04/22 0500  Weight: 111.1 kg (244 lb 14.9 oz) 110.8 kg (244 lb 4.3 oz) 111.8 kg (246 lb 7.6 oz)       Latest Ref Rng & Units 08/04/2022    5:28 AM 08/02/2022    6:52 AM 07/31/2022    4:33 AM  CBC  WBC 4.0 - 10.5 K/uL 11.2  14.1  11.0   Hemoglobin 13.0 - 17.0 g/dL 7.3  8.1  9.6   Hematocrit 39.0 - 52.0 % 23.2  26.4  30.5   Platelets 150 - 400 K/uL 257  216  227      Estimated Creatinine Clearance: 16.1 mL/min (A) (by C-G formula based on SCr of 5.1 mg/dL (H)).  Antibiotics Given (last 72 hours)      Date/Time Action Medication Dose Rate   08/01/22 1744 New Bag/Given   meropenem (MERREM) 1 g in sodium chloride 0.9 % 100 mL IVPB 1 g 200 mL/hr   08/02/22 0540 New Bag/Given   meropenem (MERREM) 1 g in sodium chloride 0.9 % 100 mL IVPB 1 g 200 mL/hr   08/02/22 1820 New Bag/Given   meropenem (MERREM) 1 g in sodium chloride 0.9 % 100 mL IVPB 1 g 200 mL/hr   08/03/22 0412 New Bag/Given   meropenem (MERREM) 1 g in sodium chloride 0.9 % 100 mL IVPB 1 g 200 mL/hr   08/03/22 0919 New Bag/Given   vancomycin (VANCOREADY) IVPB 2000 mg/400 mL 2,000 mg 200 mL/hr   08/03/22 1657 New Bag/Given   ceFEPIme (MAXIPIME) 2 g in sodium chloride 0.9 % 100 mL IVPB 2 g 200 mL/hr   08/04/22 0310 New Bag/Given   ceFEPIme (MAXIPIME) 1 g in sodium chloride 0.9 % 100 mL IVPB 1 g 200 mL/hr  Antimicrobials this admission:  Cefepime 08/03/2022  >>  Merrem 8/10 >> 8/12  Microbiology results: 08/03/2022  BCx: 2 bottles 1 set staph epidermidis and staph homininus, meca resistance detected  08/01/2022  UCx: >100k pseudomonas 08/03/2022  Resp Panel: sent  08/03/2022  MRSA PCR: sent  Thank you for allowing pharmacy to be a part of this patient's care.  Thomasenia Sales, PharmD Clinical Pharmacist

## 2022-08-04 NOTE — Progress Notes (Addendum)
Blood started at 11:34 Signed off With Chauncey Cruel, RN. Pt educated on S/S of a reaction and verbally agrees on understanding. Post 15 minute vials obtained and charted. Pt denies any SOB, or discomfort at this time.

## 2022-08-04 NOTE — Progress Notes (Signed)
PT complains of nausea , upon assessment Pt is diaphoretic, weak and unable to move his arms and legs. Charge nurse/ [facility] notified and came bedside to assess pt. MD notified. Vitals WDL.

## 2022-08-04 NOTE — Progress Notes (Addendum)
Progress Note   Patient: [name] FKC:[id number] DOB: 1946-05-20 DOA: 07/28/2022     6 DOS: the patient was seen and examined on 08/04/2022   Brief hospital course: 76 y.o. male with medical history significant of hypertension, hyperlipidemia, diastolic heart failure, CAD s/p stent, CKD stage IV, and obesity who presents with complaints of shortness of breath and chest pain starting 2 days ago.  Chest pain is substernal and feels like pressure. He chronically has orthopnea and has a bed that he always sleeps sitting up.  He has been feeling short of breath even when he is not doing anything which is not normal for him.  Denies having any fever, nausea, vomiting, diarrhea, abdominal pain, or diaphoresis.   Patient reports that he is followed by Dr. Harl Bowie of cardiology and at the end of June had been recommended to increase his Imdur to 60 mg twice daily.  He states that this initially helped decrease symptoms of chest pain.  Then he was told to start taking furosemide 40 mg twice daily by his nephrologist Dr. Theador Hawthorne on 6/12 and was started on torsemide 100 mg daily.  Since doing that patient reports that he has been swelling more.  He developed a blister on his left leg due to the swelling and he has been putting ointment on it.  Denies having any redness or erythema from the wound.   Upon admission into the emergency patient was seen to be afebrile with blood pressures 140/82 to 158/73 and all other vital signs maintained.  Labs significant for WBC 11.6, hemoglobin 8.2, potassium 3.2, BUN 74, creatinine 3.38, glucose 170, BNP 651, and high-sensitivity troponin 42. Chest x-ray showed cardiomegaly with vascular congestion.  Patient has been given Lasix 40 mg IV, potassium chloride 40 mEq, and magnesium oxide 800 mg p.o.   07/29/2022: Pt decompensated on med surg unit and rapid response called and patient placed on bipap and transferred to stepdown ICU. Cardiology and nephrology consulted and patient  started on maximal dose IV lasix by nephrology.  Pt noted to be in atrial fibrillation.  Family was called by MD and updated with change in status and treatment plan.     07/30/2022: Pt was intubated on 8/7 after failed bipap trial.  Afib converted to NSR.  He has diuresed >3L on high dose lasix therapy.     08/02/22--pt requested opioids.  After verifying with PCP pt has been receiving chronic norco, it was restarted.  Continues to have nausea--suspect partly uremia from renal failure.   Start zofran OTC.  Reglan IV x 1  08/03/2022-blood culture (8/10)-> MRSA, Urine culture (8/10)-> pseudomonas, start vanco iv pharmacy to dose, cefepime iv pharmacy to dose  Assessment and Plan:  Acute respiratory failure with hypoxia  - secondary to acute heart failure exacerbation and atrial fibrillation - Trial of bipap failed and patient intubated by PCCM Dr. Halford Chessman.  - IV lasix maximal doses ordered per nephrology with good diuresis noted - pulmonary consult appreciated - nephrology consultation appreciated - PCCM consultation appreciated - extubated 8/9>>stable on 4L>>2L   Acute HFrEF - he was massively volume overloaded at presentation -IV lasix 120 mg q 12 ordered by nephrology>>paused after 8/10 am dose - further lasix dosing per nephrology - limited echo 8/23: LVEF 35-40%, LV global hypokinesis - weight trending down with diuresis  - follow I/O (now neg 13.5L) - cardiology consultation appreciated>>not cath candidate   Acute on chronic CKD Stage IV CKD - appreciate nephrology consultation  - baseline  creatinine 3.0-3.3 - he agrees to HD if he should needed it -- discussed with pt and brother 8/9 and 8/11 and 8/12 - 8/11 uptrend in serum creatinine to 4.46 - 8/12 uptrend in serum creatinine to 4.90 - 8/13 uptrend in serum creatinine to 5.1   Fever/ UTI, Bacteremia -new fevere 101.0 8/10 afternoon -8/10 blood cultures x2>>GPC in 1 of 2 sets, initially thought to be contaminant  -Blood culture  8/10 -> MRSA -Urine culture 8/10 -> Pseudomonas - Vanco 8/12-> - Cefepime 8/12->    Hypertensive urgency - resume home meds and IV hydralazine ordered - Continue coreg, clonidine, hydralazine, imdur - soft BPs noted on precedex, agree with nephrology, changed BP meds to PRN to avoid prolonged low BP   Opioid Dependence/chronic back pain -PDMP does not show any recent opioid Rx in last 2 years -verified with PCP office pt is on norco 5/325 q 6 hrs prn>>restart - DC Norco 8/13, not controlling pain and concern for sedation - Start Tramadol 64m po q6h prn pain 8/13  Chest pain / longstanding angina / CAD - appreciate cardiology consult s/p DES to LAD and D1 in 10/2012 with nonobstructive disease along RCA and LCx. Most recent ischemic evaluation was a low-risk NST in 04/2022 - he is being treated medically with ASA 81 mg, coreg, pravastatin - not cath candidate due to CKD   Leukocytosis  - stress reaction likely vs UTI/ bacteremia - follow, no s/s of    Hypokalemia resolved - repleted previously - mag 2.1   Essential hypertension  - resumed home carvedilol, clonidine, isosorbide, hydralazine     Diabetes mellitus, type 2, controlled with renal complications - continue close CBG monitoring and SSI coverage    Paroxysmal Atrial Fibrillation  - new diagnosis this admission -  His CHA2DS2-VASc Score is at least 6. Has not yet been started on anticoagulation, likely due to his chronic anemia  -Unless noted to have definitive atrial fibrillation, would prefer not to commit to long-term anticoagulation.  -cardiology following   Anemia of chronic renal disease - low threshold to transfuse for Hg<8 as it contributes to chest pain and respiratory distress  - Transfuse 2 units prbc for Hgb 7.1 on 8/13   GERD  - protonix for GI protection    Nausea - cont zofran prn   Hyperlipidemia - resume statin   Gout , increased in generalized pain 8/13 - continue allopurinolol  -  watch very closely in setting of aggressive diuresis and CKD  -start low dose prednisone 270mpo qday to see if this will help with generalized pain   Obesity class I - BMI 33.88 - lifestyle modification  Constipation -colace 10034mo qday x 3 days start 8/13           Family Communication:   brother updated at bedside 8/12   Consultants:  renal, pulm, cardiology   Code Status:  FULL    DVT Prophylaxis:  [place] Heparin      Procedures: As Listed in Progress Note Above   Antibiotics: Vanco 8/12-> Cefepime 8/12->        Subjective: pt notes increase in nausea today.  Pt notes generalized pain, thinks that its his gout.  Pt denies fever, chills, cough, cp, palp, sob, abd pain, diarrhea, brbpr, dysuria, hematuria.  Pt seems slightly confused,  axox2, person, day, but not place.  ([place]inks [place] was in [place]   Physical Exam: Vitals:   08/03/22 1300 08/03/22 2124 08/04/22 0500 08/04/22 [id number]  BP: (!) 112/40 (!) 132/45 (!) 127/54 (!) 142/63  Pulse: 62 67 72 74  Resp: 16 18 18 16   Temp: 99.2 F (37.3 C) 98.8 F (37.1 C) 99.3 F (37.4 C) 98.8 F (37.1 C)  TempSrc: Oral Oral Oral Oral  SpO2: 100% 100% 100% 100%  Weight:   111.8 kg   Height:       Heent: anicteric, + arcus senilis, pupils 1.61m symmetric, direct, consensual, near intact, mm dry, tongue midline Neck: no jvd Heart: rrr s1, s2, 1/6 sem rusb Lung: slight crackle left lung base, no wheezing.  Abd: soft, obese, nt, nd, +bs Ext: no c/c/e, onychomycosis Skin: iv in left forearm, and right antecub Neuro: cn2-12 intact, reflexes 2+ symmetric, diffuse with no clonus, motor 5/5 in all 4 ext Slight swelling mcp joint left hand  Data Reviewed: Labs 8/13 Wbc 11.2, hgb 7.3, Plt 257 Na 147, K 4.7 Bun 148, Creatinine 5.1 Ast 14, Alt 12 Alb 2.7   Time spent: 45 minutes  Author: JJani Gravel MD 08/04/2022 9:09 AM  For on call review [email or web address]

## 2022-08-04 NOTE — Progress Notes (Signed)
   08/04/22 1818  Vitals  Vital Signs Type (Include Temp, Pulse, RR, and B/P) Blood transfusion completion (within 30 minutes)  Temp 97.7 F (36.5 C)  Temp Source Axillary  Pulse Rate 83  Resp 17  BP (!) 150/60  Oxygen Therapy  SpO2 100 %  O2 Device Nasal Cannula  O2 Flow Rate (L/min) 2 L/min  Transfuse RBC  Volume 250   Pts IV infiltrated during transfusion, PT refused to let ICU nurse try and get another access. Pt and family educated on the importance of getting a new IV. Pt received approximately 250MLs of the second unit of blood.

## 2022-08-04 NOTE — Progress Notes (Signed)
New IV access infiltrated during last unit of blood. Pt is a hard stick and several nurses tried to gain access. Pt refused to allow staff to try again states " you guys please leave me alone". MD and charge nurse notified.

## 2022-08-04 NOTE — Progress Notes (Signed)
Verbal consent for blood transfusion due to pt being weak and unable to sign. Deirdre Pippins RN second verified consent. Consent placed in chart.

## 2022-08-04 NOTE — Progress Notes (Signed)
IV infiltrated blood admin paused to start new IV.

## 2022-08-05 ENCOUNTER — Encounter (HOSPITAL_COMMUNITY): Payer: Self-pay | Admitting: Internal Medicine

## 2022-08-05 DIAGNOSIS — Z515 Encounter for palliative care: Secondary | ICD-10-CM | POA: Diagnosis not present

## 2022-08-05 DIAGNOSIS — I5033 Acute on chronic diastolic (congestive) heart failure: Secondary | ICD-10-CM | POA: Diagnosis not present

## 2022-08-05 DIAGNOSIS — D649 Anemia, unspecified: Secondary | ICD-10-CM | POA: Diagnosis not present

## 2022-08-05 DIAGNOSIS — Z7189 Other specified counseling: Secondary | ICD-10-CM | POA: Diagnosis not present

## 2022-08-05 DIAGNOSIS — J9601 Acute respiratory failure with hypoxia: Secondary | ICD-10-CM | POA: Diagnosis not present

## 2022-08-05 DIAGNOSIS — I5020 Unspecified systolic (congestive) heart failure: Secondary | ICD-10-CM | POA: Diagnosis not present

## 2022-08-05 DIAGNOSIS — I2510 Atherosclerotic heart disease of native coronary artery without angina pectoris: Secondary | ICD-10-CM | POA: Diagnosis not present

## 2022-08-05 LAB — BPAM RBC
Blood Product Expiration Date: 202309022359
Blood Product Expiration Date: 202309052359
ISSUE DATE / TIME: 202308131126
ISSUE DATE / TIME: 202308131430
Unit Type and Rh: 6200
Unit Type and Rh: 6200

## 2022-08-05 LAB — RENAL FUNCTION PANEL
Albumin: 2.7 g/dL — ABNORMAL LOW (ref 3.5–5.0)
Anion gap: 12 (ref 5–15)
BUN: 132 mg/dL — ABNORMAL HIGH (ref 8–23)
CO2: 28 mmol/L (ref 22–32)
Calcium: 9.5 mg/dL (ref 8.9–10.3)
Chloride: 107 mmol/L (ref 98–111)
Creatinine, Ser: 5.02 mg/dL — ABNORMAL HIGH (ref 0.61–1.24)
GFR, Estimated: 11 mL/min — ABNORMAL LOW (ref >60)
Glucose, Bld: 198 mg/dL — ABNORMAL HIGH (ref 70–99)
Phosphorus: 4.9 mg/dL — ABNORMAL HIGH (ref 2.5–4.6)
Potassium: 5 mmol/L (ref 3.5–5.1)
Sodium: 147 mmol/L — ABNORMAL HIGH (ref 135–145)

## 2022-08-05 LAB — CBC
HCT: 25.8 % — ABNORMAL LOW (ref 39.0–52.0)
Hemoglobin: 8 g/dL — ABNORMAL LOW (ref 13.0–17.0)
MCH: 31 pg (ref 26.0–34.0)
MCHC: 31 g/dL (ref 30.0–36.0)
MCV: 100 fL (ref 80.0–100.0)
Platelets: 286 10*3/uL (ref 150–400)
RBC: 2.58 MIL/uL — ABNORMAL LOW (ref 4.22–5.81)
RDW: 16.8 % — ABNORMAL HIGH (ref 11.5–15.5)
WBC: 9.9 10*3/uL (ref 4.0–10.5)
nRBC: 0 % (ref 0.0–0.2)

## 2022-08-05 LAB — TYPE AND SCREEN
ABO/RH(D): A POS
Antibody Screen: NEGATIVE
Unit division: 0
Unit division: 0

## 2022-08-05 LAB — GLUCOSE, CAPILLARY
Glucose-Capillary: 172 mg/dL — ABNORMAL HIGH (ref 70–99)
Glucose-Capillary: 194 mg/dL — ABNORMAL HIGH (ref 70–99)
Glucose-Capillary: 290 mg/dL — ABNORMAL HIGH (ref 70–99)
Glucose-Capillary: 241 mg/dL — ABNORMAL HIGH (ref 70–99)

## 2022-08-05 MED ORDER — SODIUM CHLORIDE 0.9 % IV SOLN
12.5000 mg | Freq: Three times a day (TID) | INTRAVENOUS | Status: DC | PRN
  Administered 2022-08-05 – 2022-08-07 (×2): 12.5 mg via INTRAVENOUS
  Filled 2022-08-05: qty 0.5

## 2022-08-05 NOTE — Anesthesia Preprocedure Evaluation (Addendum)
Anesthesia Evaluation  Patient identified by MRN, date of birth, ID band Patient awake    Reviewed: Allergy & Precautions, NPO status , Patient's Chart, lab work & pertinent test results  Airway Mallampati: III  TM Distance: >3 FB Neck ROM: Full    Dental  (+) Partial Upper, Partial Lower, Missing   Pulmonary sleep apnea , former smoker,           Cardiovascular hypertension, Pt. on medications + angina + CAD, + Past MI, + Cardiac Stents and +CHF  + dysrhythmias Atrial Fibrillation and Supra Ventricular Tachycardia   1. Limited echo for LVEF and regional wall motion.  2. No apical thrombus by Definity contrast. Left ventricular ejection  fraction, by estimation, is 35 to 40%. Left ventricular ejection fraction  by 2D MOD biplane is 36.6 %. The left ventricle has moderately decreased  function. The left ventricle  demonstrates severe global hypokinesis, worse at the apex. Regional wall  motion is difficult to ascertain given frequent PVC's.  3. The inferior vena cava is dilated in size with <50% respiratory  variability, suggesting right atrial pressure of 15 mmHg.  4. Right ventricular systolic function was not well visualized. The right  ventricular size is not well visualized.  5. Rhythm strip during this exam demonstrates atrial fibrillation.    Neuro/Psych PSYCHIATRIC DISORDERS Anxiety Depression  Neuromuscular disease    GI/Hepatic Neg liver ROS, GERD  Medicated,  Endo/Other  diabetes, Well Controlled, Type 2, Oral Hypoglycemic Agents  Renal/GU ESRF and DialysisRenal disease  negative genitourinary   Musculoskeletal  (+) Arthritis , Osteoarthritis,    Abdominal   Peds negative pediatric ROS (+)  Hematology  (+) Blood dyscrasia, anemia ,   Anesthesia Other Findings 29-Jul-2022 21:02:26 [place] [facility]-AP-300 ROUTINE RECORD 07-09-1946 (44 yr) Male Black Vent. rate 59 BPM PR interval 408 ms QRS  duration 100 ms QT/QTcB 450/445 ms P-R-T axes 26 7 259 Sinus bradycardia with marked sinus arrhythmia with 1st degree A-V block Premature ventricular complexes Septal infarct , age undetermined Abnormal ECG Rate slower compared to prior tracing. rhytym strip has artifact. recommend repeat. Confirmed by Asencion Noble [phone]) on 07/31/2022 7:15:43 PM Confirmed  Reproductive/Obstetrics negative OB ROS                            Anesthesia Physical Anesthesia Plan  ASA: 4  Anesthesia Plan: MAC   Post-op Pain Management: Minimal or no pain anticipated   Induction: Intravenous  PONV Risk Score and Plan:   Airway Management Planned: Natural Airway and Nasal Cannula  Additional Equipment:   Intra-op Plan:   Post-operative Plan:   Informed Consent:   Plan Discussed with:   Anesthesia Plan Comments:        Anesthesia Quick Evaluation

## 2022-08-05 NOTE — Plan of Care (Signed)
  Problem: Pain Managment: Goal: General experience of comfort will improve Outcome: Progressing   Problem: Safety: Goal: Ability to remain free from injury will improve Outcome: Progressing   Problem: Skin Integrity: Goal: Risk for impaired skin integrity will decrease Outcome: Progressing   

## 2022-08-05 NOTE — Progress Notes (Signed)
Progress Note  Patient Name: [name] Date of Encounter: 08/05/2022  Primary Cardiologist: Carlyle Dolly, MD  Subjective   No shortness of breath or chest pain at rest.  Taking medications crushed per nursing this morning.  Inpatient Medications    Scheduled Meds:  sodium chloride   Intravenous Once   acetaminophen  1,000 mg Oral Q6H   acetaminophen  650 mg Oral Once   allopurinol  100 mg Oral Daily   aspirin  81 mg Oral Daily   atorvastatin  80 mg Oral Daily   carvedilol  6.25 mg Oral BID WC   cloNIDine  0.1 mg Oral BID   darbepoetin (ARANESP) injection - NON-DIALYSIS  200 mcg Subcutaneous Q Tue-1800   docusate sodium  100 mg Oral Daily   feeding supplement  237 mL Oral BID BM   heparin  5,000 Units Subcutaneous Q8H   hydrALAZINE  100 mg Oral TID   insulin aspart  0-5 Units Subcutaneous QHS   insulin aspart  0-9 Units Subcutaneous TID WC   isosorbide mononitrate  120 mg Oral Daily   ondansetron (ZOFRAN) IV  4 mg Intravenous Q6H   pantoprazole  40 mg Oral BID   predniSONE  20 mg Oral Q breakfast   sodium chloride flush  3 mL Intravenous Q12H   Continuous Infusions:  sodium chloride     ceFEPime (MAXIPIME) IV     vancomycin 1,500 mg (08/05/22 0919)   PRN Meds: sodium chloride, acetaminophen, mouth rinse, polyethylene glycol, sodium chloride flush, traMADol   Vital Signs    Vitals:   08/04/22 1549 08/04/22 1818 08/04/22 1900 08/05/22 0523  BP:  (!) 150/60 (!) 132/52 (!) 137/52  Pulse:  83 73 65  Resp:  17 20 16   Temp:  97.7 F (36.5 C) 98.4 F (36.9 C) 97.8 F (36.6 C)  TempSrc:  Axillary Oral   SpO2: 100% 100% 100% 100%  Weight:    110.6 kg  Height:        Intake/Output Summary (Last 24 hours) at 08/05/2022 0936 Last data filed at 08/05/2022 0902 Gross per 24 hour  Intake 823 ml  Output 1700 ml  Net -877 ml   Filed Weights   08/03/22 0412 08/04/22 0500 08/05/22 0523  Weight: 110.8 kg 111.8 kg 110.6 kg    Telemetry    Sinus rhythm.   Personally reviewed.  ECG    No ECG reviewed.  Physical Exam   GEN: No acute distress.   Neck: No JVD. Cardiac: RRR, 1/6 systolic murmur, no gallop.  Respiratory: Nonlabored.  Decreased at bases, no wheezing. GI: Soft, nontender, bowel sounds present. MS: No edema. Neuro:  Nonfocal. Psych: Alert and oriented x 2. Normal affect.  Labs    Chemistry Recent Labs  Lab 08/04/22 0527 08/04/22 0528 08/05/22 0507  NA 146* 147* 147*  K 4.6 4.7 5.0  CL 105 105 107  CO2 27 27 28   GLUCOSE 166* 166* 198*  BUN 146* 148* 132*  CREATININE 4.95* 5.10* 5.02*  CALCIUM 9.4 9.4 9.5  PROT  --  6.9  --   ALBUMIN 2.7* 2.7* 2.7*  AST  --  14*  --   ALT  --  12  --   ALKPHOS  --  54  --   BILITOT  --  0.6  --   GFRNONAA 11* 11* 11*  ANIONGAP 14 15 12      Hematology Recent Labs  Lab 08/02/22 3704 08/04/22 0528 08/04/22 1925 08/05/22 8889  WBC 14.1* 11.2*  --  9.9  RBC 2.60* 2.28*  --  2.58*  HGB 8.1* 7.3* 8.5* 8.0*  HCT 26.4* 23.2* 27.0* 25.8*  MCV 101.5* 101.8*  --  100.0  MCH 31.2 32.0  --  31.0  MCHC 30.7 31.5  --  31.0  RDW 16.7* 16.8*  --  16.8*  PLT 216 257  --  286    Cardiac Enzymes Recent Labs  Lab 07/28/22 2328 07/29/22 0213  TROPONINIHS 42* 41*    Radiology    No results found.  Cardiac Studies   Lexiscan Myoview 05/06/2022:   Findings are consistent with prior inferior myocardial infarction. There is a small apical infarct with mild peri-infarct ischemia.  The study is low risk.   No ST deviation was noted.   LV perfusion is abnormal.   Left ventricular function is normal. Nuclear stress EF: 55 %. The left ventricular ejection fraction is normal (55-65%). End diastolic cavity size is severely enlarged.  Echocardiogram 07/29/2022:  1. Limited echo for LVEF and regional wall motion.   2. No apical thrombus by Definity contrast. Left ventricular ejection  fraction, by estimation, is 35 to 40%. Left ventricular ejection fraction  by 2D MOD biplane is 36.6  %. The left ventricle has moderately decreased  function. The left ventricle  demonstrates severe global hypokinesis, worse at the apex. Regional wall  motion is difficult to ascertain given frequent PVC's.   3. The inferior vena cava is dilated in size with <50% respiratory  variability, suggesting right atrial pressure of 15 mmHg.   4. Right ventricular systolic function was not well visualized. The right  ventricular size is not well visualized.   5. Rhythm strip during this exam demonstrates atrial fibrillation.   Assessment & Plan    1.  HFrEF, acute and in the setting of comorbid illness, no clear evidence of ACS by high-sensitivity troponin I levels (flat in the 40s).  LVEF approximately 35 to 40%.  Does have known CAD at baseline, LVEF was normal as of May at which point he had evidence of inferior wall scar with mild peri-infarct ischemia managed medically.  Current degree of renal disease affects optimal GDMT.  Presently he is on Coreg, Lipitor, hydralazine, Imdur, and intermittent IV diuresis.  2.  Acute on chronic CKD, stage IV at baseline.  Nephrology continuing to address and there are plans to consider vascular access for hemodialysis ultimately.  Creatinine 5.02.  3.  Paroxysmal atrial fibrillation, in sinus rhythm currently.  CHA2DS2-VASc score is at least 6.  Long-term anticoagulation is not being pursued at this time.  4.  Acute on chronic anemia, likely of chronic disease, cannot exclude low-level GI loss.  Has received PRBCs during stay.  Current hemoglobin 8.0.  5.  Status post UTI with bacteremia, continue antibiotic therapy.  From a cardiac perspective would continue Coreg, Lipitor, hydralazine, and Imdur.  Not candidate for MRA due to current degree of renal disease and SGLT2 inhibitor would be avoided as well particularly in light of his recent UTI with bacteremia.  Would manage volume status per nephrology.  We will follow in the background at this  time.  Signed, Rozann Lesches, MD  08/05/2022, 9:36 AM

## 2022-08-05 NOTE — Progress Notes (Signed)
Subjective:  got transfused last night but IV infiltrated so did not get whole blood volume -   had 1700 of UOP overnight-  volume status much improved-  he is very weak, nauseated and has the hiccups- BUN and crt have been climbing all weekend-  a little bit better today    Objective Vital signs in last 24 hours: Vitals:   08/04/22 1549 08/04/22 1818 08/04/22 1900 08/05/22 0523  BP:  (!) 150/60 (!) 132/52 (!) 137/52  Pulse:  83 73 65  Resp:  17 20 16   Temp:  97.7 F (36.5 C) 98.4 F (36.9 C) 97.8 F (36.6 C)  TempSrc:  Axillary Oral   SpO2: 100% 100% 100% 100%  Weight:    110.6 kg  Height:       Weight change: -1.2 kg  Intake/Output Summary (Last 24 hours) at 08/05/2022 0805 Last data filed at 08/05/2022 0523 Gross per 24 hour  Intake 580 ml  Output 1700 ml  Net -1120 ml    Assessment/Plan: 76 year old BM with DM, HTN, diastolic heart failure,  CAD and CKD-  presenting with SOB and CP   1.AKI- advanced CKD at baseline.  He has had volume overload contributing to respiratory failure.  Post void bladder scan elevated at 190 ml with recheck under 100 ml - There has been no acute indication for dialysis and he would want if it were to become needed. He definitely is not thriving and I am sure the BUN over 100 is not helping-  I doubt that his numbers will get that much better.  I would favor starting HD-  he seems agreeable-  I will talk to his brother as well and maybe get started as soon as tomorrow -  will talk to vascular vs general surgery for TDC     1a. CKD stage IV - baseline Cr is in the mid 3's recently   2. Hypertension/volume  - controlled. Volume status improving. Now with high BUN and high sodium -  no IV for d5w-  encourage drinking.  Also on hydralazine 100 tid/coreg 6.25 BID and clonidine 0.1 BID   3. Anemia  - has been getting iron and ESA as outpatient.  S/p IV iron and on aranesp 200 mcg every Tuesday    4. Hypokalemia-  repleted    5. Acute systolic heart  failure  cardiology following.  Medical management recommended at this time per charting. Optimize volume status as above   6. Acute hypoxic resp failure - extubated. Optimize volume status above. Good sats on 2 Liters    [name]    Labs: Basic Metabolic Panel: Recent Labs  Lab 08/03/22 1131 08/04/22 0527 08/04/22 0528 08/05/22 0507  NA 144 146* 147* 147*  K 4.4 4.6 4.7 5.0  CL 102 105 105 107  CO2 27 27 27 28   GLUCOSE 224* 166* 166* 198*  BUN 139* 146* 148* 132*  CREATININE 4.90* 4.95* 5.10* 5.02*  CALCIUM 9.1 9.4 9.4 9.5  PHOS 5.6* 5.9*  --  4.9*   Liver Function Tests: Recent Labs  Lab 08/04/22 0527 08/04/22 0528 08/05/22 0507  AST  --  14*  --   ALT  --  12  --   ALKPHOS  --  54  --   BILITOT  --  0.6  --   PROT  --  6.9  --   ALBUMIN 2.7* 2.7* 2.7*   No results for input(s): "LIPASE", "AMYLASE" in the last 168  hours. No results for input(s): "AMMONIA" in the last 168 hours. CBC: Recent Labs  Lab 07/30/22 0331 07/31/22 0433 08/02/22 0652 08/04/22 0528 08/04/22 1925 08/05/22 0509  WBC 10.4 11.0* 14.1* 11.2*  --  9.9  HGB 8.0* 9.6* 8.1* 7.3* 8.5* 8.0*  HCT 24.8* 30.5* 26.4* 23.2* 27.0* 25.8*  MCV 98.8 99.3 101.5* 101.8*  --  100.0  PLT 186 227 216 257  --  286   Cardiac Enzymes: No results for input(s): "CKTOTAL", "CKMB", "CKMBINDEX", "TROPONINI" in the last 168 hours. CBG: Recent Labs  Lab 08/04/22 0747 08/04/22 1215 08/04/22 1641 08/04/22 2120 08/05/22 0720  GLUCAP 146* 187* 216* 272* 172*    Iron Studies: No results for input(s): "IRON", "TIBC", "TRANSFERRIN", "FERRITIN" in the last 72 hours. Studies/Results: No results found. Medications: Infusions:  sodium chloride     ceFEPime (MAXIPIME) IV     vancomycin      Scheduled Medications:  sodium chloride   Intravenous Once   acetaminophen  1,000 mg Oral Q6H   acetaminophen  650 mg Oral Once   allopurinol  100 mg Oral Daily   aspirin  81 mg Oral Daily   atorvastatin   80 mg Oral Daily   carvedilol  6.25 mg Oral BID WC   cloNIDine  0.1 mg Oral BID   darbepoetin (ARANESP) injection - NON-DIALYSIS  200 mcg Subcutaneous Q Tue-1800   docusate sodium  100 mg Oral Daily   feeding supplement  237 mL Oral BID BM   heparin  5,000 Units Subcutaneous Q8H   hydrALAZINE  100 mg Oral TID   insulin aspart  0-5 Units Subcutaneous QHS   insulin aspart  0-9 Units Subcutaneous TID WC   isosorbide mononitrate  120 mg Oral Daily   ondansetron (ZOFRAN) IV  4 mg Intravenous Q6H   pantoprazole  40 mg Oral BID   predniSONE  20 mg Oral Q breakfast   sodium chloride flush  3 mL Intravenous Q12H    have reviewed scheduled and prn medications.  Physical Exam: General: very weak-  difficulty raising arms-  slow conversation  Heart: RRR Lungs: mostly clear-  poor effort Abdomen: obese, soft, non tender Extremities: non pitting edema  Dialysis Access: none yet     08/05/2022,8:05 AM  LOS: 7 days

## 2022-08-05 NOTE — Inpatient Diabetes Management (Signed)
Inpatient Diabetes Program Recommendations  AACE/ADA: New Consensus Statement on Inpatient Glycemic Control   Target Ranges:  Prepandial:   less than 140 mg/dL      Peak postprandial:   less than 180 mg/dL (1-2 hours)      Critically ill patients:  140 - 180 mg/dL    Latest Reference Range & Units 08/04/22 07:47 08/04/22 12:15 08/04/22 16:41 08/04/22 21:20 08/05/22 07:20  Glucose-Capillary 70 - 99 mg/dL 146 (H) 187 (H) 216 (H) 272 (H) 172 (H)   Review of Glycemic Control  Diabetes history: DM2 Outpatient Diabetes medications: NPH 20 units daily if needed, Januvia 25 mg daily Current orders for Inpatient glycemic control: Novolog 0-9 units TID with meals, Novolog 0-5 units QHS; Prednsione 20 mg QAM  Inpatient Diabetes Program Recommendations:    Insulin: If steroids are continued, please consider ordering Novolog 3 units TID with meals for meal coverage if patient eats at least 50% of meals.  Thanks, Barnie Alderman, RN, MSN, [facility] Coordinator Inpatient Diabetes Program [phone] (Team Pager from 8am to [place])

## 2022-08-05 NOTE — Progress Notes (Signed)
Progress Note   Patient: [name] DOB: 1946/03/16 DOA: 07/28/2022     7 DOS: the patient was seen and examined on 08/05/2022     Subjective:  Patient was seen and examined this morning, seems to be have excess fluid, including anasarca, difficulty finishing sentences... Mildly confused But denies  fever, chills, cough, cp, palp, sob, abd pain, diarrhea, brbpr, dysuria, hematuria.     Brief hospital course: [name] is a 77 y.o. male with medical history significant of hypertension, hyperlipidemia, diastolic heart failure, CAD s/p stent, CKD stage IV, and obesity who presents with complaints of shortness of breath and chest pain starting 2 days ago.  Chest pain is substernal and feels like pressure. He chronically has orthopnea and has a bed that he always sleeps sitting up.  He has been feeling short of breath even when he is not doing anything which is not normal for him.  Denies having any fever, nausea, vomiting, diarrhea, abdominal pain, or diaphoresis.   Patient reports that he is followed by Dr. Harl Bowie of cardiology and at the end of June had been recommended to increase his Imdur to 60 mg twice daily.  He states that this initially helped decrease symptoms of chest pain.  Then he was told to start taking furosemide 40 mg twice daily by his nephrologist Dr. Theador Hawthorne on 6/12 and was started on torsemide 100 mg daily.  Since doing that patient reports that he has been swelling more.  He developed a blister on his left leg due to the swelling and he has been putting ointment on it.  Denies having any redness or erythema from the wound.   Upon admission into the emergency patient was seen to be afebrile with blood pressures 140/82 to 158/73 and all other vital signs maintained.  Labs significant for WBC 11.6, hemoglobin 8.2, potassium 3.2, BUN 74, creatinine 3.38, glucose 170, BNP 651, and high-sensitivity troponin 42. Chest x-ray showed cardiomegaly with vascular  congestion.  Patient has been given Lasix 40 mg IV, potassium chloride 40 mEq, and magnesium oxide 800 mg p.o.   07/29/2022: Pt decompensated on med surg unit and rapid response called and patient placed on bipap and transferred to stepdown ICU. Cardiology and nephrology consulted and patient started on maximal dose IV lasix by nephrology.  Pt noted to be in atrial fibrillation.  Family was called by MD and updated with change in status and treatment plan.     07/30/2022: Pt was intubated on 8/7 after failed bipap trial.  Afib converted to NSR.  He has diuresed >3L on high dose lasix therapy.     08/02/22--pt requested opioids.  After verifying with PCP pt has been receiving chronic norco, it was restarted.  Continues to have nausea--suspect partly uremia from renal failure.   Start zofran OTC.  Reglan IV x 1  08/03/2022-blood culture (8/10)-> MRSA, Urine culture (8/10)-> pseudomonas, start vanco iv pharmacy to dose, cefepime iv pharmacy to dose   08/05/2022 -patient current progressively getting worse, mildly confused, anasarca, nephrologist following recommending patient to start hemodialysis, vascular surgeon Dr. Donnetta Hutching was consulted -Palliative care consulted, discussed with niece at bedside -Blood cultures confirmed staph epididymis-MRSA-discontinuing IV vancomycin 8/14  Assessment and Plan:  Acute respiratory failure with hypoxia  -Currently satting 100% on 2 L of oxygen, continue have mild shortness of breath, anasarca - secondary to acute heart failure exacerbation and atrial fibrillation - Trial of bipap failed and patient intubated by PCCM Dr. Halford Chessman.  -  IV lasix maximal doses ordered per nephrology -with minimal improvement and worsening kidney function - pulmonary follow-up appreciated - nephrology following closely -recommending starting hemodialysis as soon as possible Vascular surgery Dr. Donnetta Hutching was consulted, who will place a hemodialysis catheter in a.m. 08/06/2022   - extubated  8/9>>stable on 4L>>2L   Acute HFrEF - he was massively volume overloaded at presentation -Still volume overload with anasarca -IV lasix 120 mg q 12 ordered by nephrology>> minimal improvement, worsening kidney function and anasarca - further lasix dosing per nephrology - limited echo 8/23: LVEF 35-40%, LV global hypokinesis - weight trending down with diuresis  - follow I/O (now neg 13.5L) - cardiology consultation appreciated>>not cath candidate, continue to follow   Acute on chronic CKD Stage IV CKD - appreciate nephrology consultation  - baseline creatinine 3.0-3.3 - he agrees to HD if he should needed it -- discussed with pt and brother 8/9 and 8/11 and 8/12 - 8/11 uptrend in serum creatinine to 4.46 - 8/12 uptrend in serum creatinine to 4.90 - 8/13 uptrend in serum creatinine to 5.1 - 8/14 uptrending serum creatinine to 5.02   Fever/ UTI, Bacteremia -new fevere 101.0 as of 8/10  -8/10 blood cultures x2>>GPC in 1 of 2 sets, staph epididymis -Blood culture 8/10 -> MRSA -1 out of 2 culture positive for staph epididymis -Urine culture 8/10 -> Pseudomonas - Vanco 8/12-> discontinue 8/14 - Cefepime 8/12->    Hypertensive urgency - resume home meds and IV hydralazine ordered - Continue coreg, clonidine, hydralazine, imdur - soft BPs noted on precedex, agree with nephrology, changed BP meds to PRN to avoid prolonged low BP   Opioid Dependence/chronic back pain -PDMP does not show any recent opioid Rx in last 2 years -verified with PCP office pt is on norco 5/325 q 6 hrs prn>>restart - DC Norco 8/13, not controlling pain and concern for sedation - Start Tramadol 59m po q6h prn pain 8/13  Chest pain / longstanding angina / CAD - appreciate cardiology consult s/p DES to LAD and D1 in 10/2012 with nonobstructive disease along RCA and LCx. Most recent ischemic evaluation was a low-risk NST in 04/2022 - he is being treated medically with ASA 81 mg, coreg, pravastatin - not cath  candidate due to CKD   Leukocytosis  - stress reaction likely vs UTI/ bacteremia -Trending down - follow, no s/s of    Hypokalemia resolved - repleted previously - mag 2.1   Essential hypertension  - resumed home carvedilol, isosorbide, hydralazine, Aldactone     Diabetes mellitus, type 2, controlled with renal complications - continue close CBG monitoring and SSI coverage    Paroxysmal Atrial Fibrillation  - new diagnosis this admission -  His CHA2DS2-VASc Score is at least 6. Has not yet been started on anticoagulation, likely due to his chronic anemia  -Unless noted to have definitive atrial fibrillation, would prefer not to commit to long-term anticoagulation.  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 - lifestyle modification  Constipation -colace 125m po qday x 3 days start 8/13     Ethics: Patient remain full code, discussed with niece at bedside, palliative care consulted To determine goals of care, CODE STATUS And lieu of multiple comorbidities including acute renal failure, heart failure we are recommending DNR status, pursuing with palliative care-expecting steep decline     Family Communication:   brother updated at bedside 8/12  Consultants:  renal, pulm, cardiology  Code Status:  FULL    DVT Prophylaxis:  [place] Heparin      Procedures: As Listed in Progress Note Above   Antibiotics: Vanco  8/12-> Cefepime 8/12->           Physical Exam: Blood pressure (!) 137/52, pulse 65, temperature 97.8 F (36.6 C), resp. rate 16, height 6' 2"  (1.88 m), weight 110.6 kg, SpO2 100 %.   General:  AAO x 2,  cooperative, no distress;   HEENT:  Normocephalic, PERRL, otherwise with in Normal limits   Neuro:  CNII-XII intact. , normal motor and sensation, reflexes intact   Lungs:   Clear to auscultation BL, Respirations unlabored,  No wheezes / crackles  Cardio:    S1/S2, RRR, No murmure, No Rubs or Gallops   Abdomen:  Soft, non-tender, bowel sounds active all four quadrants, no guarding or peritoneal signs.  Muscular  skeletal:  Diffuse edema -anasarca Limited exam -global generalized weaknesses - in bed, able to move all 4 extremities,   2+ pulses,  symmetric, ++3 pitting edema  Skin:  Dry, warm to touch, negative for any Rashes,  Wounds: Please see nursing documentation          Data Reviewed:    Latest Ref Rng & Units 08/05/2022    5:09 AM 08/04/2022    7:25 PM 08/04/2022    5:28 AM  CBC  WBC 4.0 - 10.5 K/uL 9.9   11.2   Hemoglobin 13.0 - 17.0 g/dL 8.0  8.5  7.3   Hematocrit 39.0 - 52.0 % 25.8  27.0  23.2   Platelets 150 - 400 K/uL 286   257       Latest Ref Rng & Units 08/05/2022    5:07 AM 08/04/2022    5:28 AM 08/04/2022    5:27 AM  BMP  Glucose 70 - 99 mg/dL 198  166  166   BUN 8 - 23 mg/dL 132  148  146   Creatinine 0.61 - 1.24 mg/dL 5.02  5.10  4.95   Sodium 135 - 145 mmol/L 147  147  146   Potassium 3.5 - 5.1 mmol/L 5.0  4.7  4.6   Chloride 98 - 111 mmol/L 107  105  105   CO2 22 - 32 mmol/L 28  27  27    Calcium 8.9 - 10.3 mg/dL 9.5  9.4  9.4      Time spent: 45 minutes  Author: SDeatra James MD 08/05/2022 12:32 PM  For on call review [email or web address]

## 2022-08-05 NOTE — Consult Note (Signed)
I will place a HD catheter in the OR tomorrow.  I am not at [facility] today.  I placed NPO and consent orders and will discuss the procedure with the patient in the AM. Curt Jews MD

## 2022-08-05 NOTE — Consult Note (Signed)
Consultation Note Date: 08/05/2022   Patient Name: [name]  DOB: May 12, 1946  MRN: [id number]  Age / Sex: 76 y.o., male  PCP: Redmond School, MD Referring Physician: Deatra James, MD  Reason for Consultation: Establishing goals of care  HPI/Patient Profile: 76 y.o. male  with past medical history of HTN/HLD, diastolic heart failure followed by Dr. Harl Bowie, CAD s/p stent, CKD 4, DM, neuropathy, DJD, BPH, obesity, admitted on 07/28/2022 with HFrEF, acute on chronic CKD.   Clinical Assessment and Goals of Care: I have reviewed medical records including EPIC notes, labs and imaging, received report from RN, assessed the patient.  Mr. [name], Cerveny, is lying quietly in bed.  He appears acutely/chronically ill and somewhat frail.  He is alert and oriented x2, he is unsure of the month.  I believe that he can make his basic needs known.  His goddaughter who he calls daughter, [name], is present at bedside.  We meet at the bedside to discuss diagnosis prognosis, GOC, EOL wishes, disposition and options.  I introduced Palliative Medicine as specialized medical care for people living with serious illness. It focuses on providing relief from the symptoms and stress of a serious illness. The goal is to improve quality of life for both the patient and the family.  We discussed a brief life review of the patient.  Mr. [name] has been living independently.  He is able to (mostly) get his own groceries, pay his own bills, cook food.  He is independent with ADLs/IADLs prior to this hospitalization.  His goddaughter shares that he has help if and when needed.  We then focused on their current illness.  We talk about his time on the ventilator, life support.   I ask if he remembers this, and he tells me that he does.  I asked how he did with it, and he replies, "it almost killed me".  I asked if he would want to go through  that again, and he states that he is willing to accept life support again if needed.  We talk in detail about cardiology consult and recommendation, the medical treatment for heart failure.  We talk about reduced sodium intake and reduced fluids.  We also talk about UTI and bacteremia and the treatment plan.  The natural disease trajectory and expectations at EOL were discussed.  Advanced directives, concepts specific to code status, artifical feeding and hydration, and rehospitalization were considered and discussed.  At this point, Mr. [name] states that he would accept life support.  He is unable to set limits on length of time.  Palliative Care services outpatient were explained and offered.  We talk about outpatient palliative services for continued goals of care discussions, support.  At this point, Mr. [name] states that he would need to think about palliative services, I show that we will continue to follow.  Discussed the importance of continued conversation with family and the medical providers regarding overall plan of care and treatment options, ensuring decisions are within the context of the  patient's values and GOCs.  Questions and concerns were addressed.  The patient and family were encouraged to call with questions or concerns.  PMT will continue to support holistically.  Conference with attending, cardiology, bedside nursing staff, transition of care team related to patient condition, needs, goals of care, disposition.   HCPOA  NEXT OF KIN -Mr. [name] names his brother, [name], as his healthcare surrogate.  His goddaughter who is present states that he does have a daughter, but she is not involved in his life.  He is a widower.    SUMMARY OF RECOMMENDATIONS   At this point continue full scope/full code Time for outcomes Agreeable to short-term rehab with ultimate goal of returning home Considering outpatient palliative services, but not yet.   Code Status/Advance Care  Planning: Full code -unable to set limits on life support.  Symptom Management:  Per hospitalist, no additional needs at this time.  Palliative Prophylaxis:  Frequent Pain Assessment and Turn Reposition  Additional Recommendations (Limitations, Scope, Preferences): Full Scope Treatment  Psycho-social/Spiritual:  Desire for further Chaplaincy support:no Additional Recommendations: Caregiving  Support/Resources and Education on Hospice  Prognosis:  Unable to determine, based on outcomes.  6 months or less would not be surprising based chronic illness burden, 2 hospital visits and 1 ED visit in the last 6 months, ventilator support this admission, bacteremia.  Discharge Planning: Agreeable to short term rehab, goal to return home. Declines out pt palliative at this time       Primary Diagnoses: Present on Admission:  (HFpEF) heart failure with preserved ejection fraction (HCC)  Chest pain  Anemia, normocytic normochromic  Chronic kidney disease, stage IV (severe) (HCC)  Elevated troponin  Essential hypertension  Gout  GERD (gastroesophageal reflux disease)  Hypercholesterolemia  Leukocytosis  Hypokalemia  Frequent PVCs  Obesity   I have reviewed the medical record, interviewed the patient and family, and examined the patient. The following aspects are pertinent.  Past Medical History:  Diagnosis Date   Anginal pain ([place])    Anxiety    Benign prostatic hypertrophy    Nocturia   CAD (coronary artery disease)    a.  NSTEMI 10/2012 s/p DES to LAD & DES to 1st diagonal with residual diffuse nonobstructive dz in LCx/RCA    Chronic kidney disease, stage 3, mod decreased GFR (HCC)    Creatinine of 1.5 in 03/2009, STAGE 4 now   Degenerative joint disease    Depression    hx of   Diabetes mellitus, type II ([place]) 78/58/8502   Diastolic heart failure    Pedal edema, "a long time ago"   Erectile dysfunction    Flu 2013   hx of   GERD (gastroesophageal reflux disease)     Gout    Hemorrhoids    High cholesterol    Hypertension    Exercise induced   Myocardial infarction ([place])    Neuropathy    bilateral legs   NSVT (nonsustained ventricular tachycardia) ([place])    Exercise induced   Obesity    Tobacco abuse, 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, quit 1994   Tubular adenoma 2014   Vertigo    everyday   Social History   Socioeconomic History   Marital status: Widowed    Spouse name: Not on file   Number of children: Not on file   Years of education: Not on file   Highest education level: Not on file  Occupational History   Occupation: Disabled  Employer: RETIRED  Tobacco Use   Smoking status: Former    Packs/day: 1.00    Years: 30.00    Total pack years: 30.00    Types: Cigarettes    Start date: 01/22/1965    Quit date: 12/23/1992    Years since quitting: 29.6   Smokeless tobacco: Never  Vaping Use   Vaping Use: Never used  Substance and Sexual Activity   Alcohol use: No    Alcohol/week: 0.0 standard drinks of alcohol   Drug use: No   Sexual activity: Not Currently  Other Topics Concern   Not on file  Social History Narrative   Married   Right handed   One story home no caffeine   Social Determinants of Health   Financial Resource Strain: Not on file  Food Insecurity: Not on file  Transportation Needs: Not on file  Physical Activity: Not on file  Stress: Not on file  Social Connections: Not on file   Family History  Problem Relation Age of Onset   Hypertension Other    Cancer Other    Heart disease Other    Diabetes Other    Hypertension Mother    Cancer Father        brain   CAD Brother    Colon cancer Neg Hx    Scheduled Meds:  sodium chloride   Intravenous Once   acetaminophen  1,000 mg Oral Q6H   acetaminophen  650 mg Oral Once   allopurinol  100 mg Oral Daily   aspirin  81 mg Oral Daily   atorvastatin  80 mg Oral Daily   carvedilol  6.25 mg Oral BID WC   cloNIDine  0.1 mg Oral BID   darbepoetin (ARANESP)  injection - NON-DIALYSIS  200 mcg Subcutaneous Q Tue-1800   docusate sodium  100 mg Oral Daily   feeding supplement  237 mL Oral BID BM   heparin  5,000 Units Subcutaneous Q8H   hydrALAZINE  100 mg Oral TID   insulin aspart  0-5 Units Subcutaneous QHS   insulin aspart  0-9 Units Subcutaneous TID WC   isosorbide mononitrate  120 mg Oral Daily   ondansetron (ZOFRAN) IV  4 mg Intravenous Q6H   pantoprazole  40 mg Oral BID   predniSONE  20 mg Oral Q breakfast   sodium chloride flush  3 mL Intravenous Q12H   Continuous Infusions:  sodium chloride     ceFEPime (MAXIPIME) IV     PRN Meds:.sodium chloride, acetaminophen, mouth rinse, polyethylene glycol, sodium chloride flush, traMADol Medications Prior to Admission:  Prior to Admission medications   Medication Sig Start Date End Date Taking? Authorizing Provider  acarbose (PRECOSE) 50 MG tablet Take 50 mg by mouth in the morning, at noon, and at bedtime.  10/20/17  Yes [provider]  allopurinol (ZYLOPRIM) 100 MG tablet Take 100 mg by mouth daily. 08/17/20  Yes [provider]  amLODipine (NORVASC) 10 MG tablet Take 10 mg by mouth every evening.    Yes [provider]  aspirin EC 81 MG tablet Take 1 tablet (81 mg total) by mouth daily. 09/14/19  Yes Herminio Commons, MD  calcitRIOL (ROCALTROL) 0.25 MCG capsule Take 0.25 mcg by mouth every Sunday.  12/13/16  Yes [provider]  carboxymethylcellulose (REFRESH PLUS) 0.5 % SOLN Place 1 drop into both eyes daily as needed (dry eyes).   Yes [provider]  carvedilol (COREG) 6.25 MG tablet Take 6.25 mg by mouth 2 (two)  times daily with a meal. 04/18/22 04/18/23 Yes [provider]  cholecalciferol (VITAMIN D) 1000 units tablet Take 1,000 Units daily by mouth.   Yes [provider]  cloNIDine (CATAPRES) 0.1 MG tablet Take 0.1 mg by mouth 3 (three) times daily. 02/15/20  Yes [provider]  colchicine 0.6 MG tablet    Yes  [provider]  dicyclomine (BENTYL) 10 MG capsule    Yes [provider]  diphenoxylate-atropine (LOMOTIL) 2.5-0.025 MG tablet Take 2 tablets by mouth 4 (four) times daily as needed. 06/18/22  Yes [provider]  epoetin alfa (EPOGEN) 3000 UNIT/ML injection Inject into the skin. 07/03/22  Yes [provider]  furosemide (LASIX) 40 MG tablet Take 1 tablet (40 mg total) by mouth daily. Patient taking differently: Take 40 mg by mouth 2 (two) times daily. 05/11/22  Yes Pokhrel, Laxman, MD  gabapentin (NEURONTIN) 300 MG capsule Take 300 mg by mouth 3 (three) times daily.  10/19/19  Yes [provider]  hydrALAZINE (APRESOLINE) 100 MG tablet Take 200 mg by mouth 2 (two) times daily. 01/24/22  Yes [provider]  HYDROcodone-acetaminophen (NORCO) 10-325 MG tablet Take 1-2 tablets by mouth 4 (four) times daily as needed for pain. 05/27/22  Yes [provider]  insulin NPH Human (NOVOLIN N) 100 UNIT/ML injection Inject 0.18 mLs (18 Units total) into the skin every morning. Ands syringes 1/day Patient taking differently: Inject 20 Units into the skin daily as needed (if CBG is high). Per sliding scale: not provided 06/27/21  Yes Renato Shin, MD  isosorbide mononitrate (IMDUR) 60 MG 24 hr tablet Take 1 tablet (60 mg total) by mouth 2 (two) times daily. 06/26/22 06/21/23 Yes BranchAlphonse Guild, MD  meclizine (ANTIVERT) 25 MG tablet Take 25 mg by mouth 3 (three) times daily as needed for dizziness.   Yes [provider]  mupirocin ointment (BACTROBAN) 2 % Apply 1 Application topically 3 (three) times daily. 07/10/22  Yes [provider]  nitroGLYCERIN (NITROSTAT) 0.4 MG SL tablet Place 1 tablet (0.4 mg total) under the tongue every 5 (five) minutes as needed for chest pain. PLACE ONE TABLET UNDER THE TONGUE EVERY 5 MINUTES FOR 3 DOSES AS NEEDED Strength: 0.4 mg 02/07/22  Yes Branch, Alphonse Guild, MD  pravastatin (PRAVACHOL) 40 MG tablet Take 1  tablet (40 mg total) by mouth every evening. 07/31/21 02/09/28 Yes Imogene Burn, PA-C  RESTASIS 0.05 % ophthalmic emulsion 1 drop 2 (two) times daily. 06/19/22  Yes [provider]  pantoprazole (PROTONIX) 40 MG tablet TAKE ONE TABLET (40MG TOTAL) BY MOUTH TWO TIMES DAILY BEFORE A MEAL Patient taking differently: Take 40 mg by mouth 2 (two) times daily. 05/11/21   Erenest Rasher, PA-C  sitaGLIPtin (JANUVIA) 25 MG tablet     [provider]  TRUE METRIX BLOOD GLUCOSE TEST test strip USE TO TEST BLOOD SUGAR UP TO TWICE DAILY OR AS DIRECTED 08/31/21   Renato Shin, MD   Allergies  Allergen Reactions   Metformin Nausea And Vomiting   Niacin    Niacin And Related Other (See Comments)    Reaction: flushing and burning side effects even with extended release   Review of Systems  Unable to perform ROS: Age    Physical Exam Vitals and nursing note reviewed.  Constitutional:      General: He is not in acute distress.    Appearance: He is obese. He is ill-appearing.  Cardiovascular:     Rate and Rhythm:  Normal rate.  Pulmonary:     Effort: Pulmonary effort is normal. No tachypnea.  Abdominal:     Palpations: Abdomen is soft.  Musculoskeletal:     Right lower leg: Edema present.     Left lower leg: Edema present.  Skin:    General: Skin is warm and dry.  Neurological:     Mental Status: He is alert.     Comments: Oriented to self and situation, not month  Psychiatric:        Mood and Affect: Mood normal.        Behavior: Behavior normal.     Vital Signs: BP (!) 137/52 (BP Location: Left Arm)   Pulse 65   Temp 97.8 F (36.6 C)   Resp 16   Ht 6' 2"  (1.88 m)   Wt 110.6 kg   SpO2 100%   BMI 31.31 kg/m  Pain Scale: 0-10 POSS *See Group Information*: 1-Acceptable,Awake and alert Pain Score: 0-No pain   SpO2: SpO2: 100 % O2 Device:SpO2: 100 % O2 Flow Rate: .O2 Flow Rate (L/min): 2 L/min  IO: Intake/output summary:  Intake/Output Summary (Last 24 hours)  at 08/05/2022 1246 Last data filed at 08/05/2022 1052 Gross per 24 hour  Intake 1063 ml  Output 1700 ml  Net -637 ml    LBM: Last BM Date : 08/02/22 Baseline Weight: Weight: 120.2 kg Most recent weight: Weight: 110.6 kg     Palliative Assessment/Data:   Flowsheet Rows    Flowsheet Row Most Recent Value  Intake Tab   Referral Department Hospitalist  Unit at Time of Referral Cardiac/Telemetry Unit  Palliative Care Primary Diagnosis Cardiac  Date Notified 08/02/22  Palliative Care Type New Palliative care  Reason for referral Clarify Goals of Care  Date of Admission 07/28/22  Date first seen by Palliative Care 08/05/22  # of days Palliative referral response time 3 Day(s)  # of days IP prior to Palliative referral 5  Clinical Assessment   Palliative Performance Scale Score 40%  Pain Max last 24 hours Not able to report  Pain Min Last 24 hours Not able to report  Dyspnea Max Last 24 Hours Not able to report  Dyspnea Min Last 24 hours Not able to report  Psychosocial & Spiritual Assessment   Palliative Care Outcomes        Time In: 0930 Time Out: 1045 Time Total: 75 minutes  Greater than 50%  of this time was spent counseling and coordinating care related to the above assessment and plan.  Signed by: Drue Novel, NP   Please contact Palliative Medicine Team phone at [phone] for questions and concerns.  For individual provider: See AmioN

## 2022-08-06 ENCOUNTER — Encounter (HOSPITAL_COMMUNITY): Payer: Self-pay | Admitting: Internal Medicine

## 2022-08-06 ENCOUNTER — Inpatient Hospital Stay (HOSPITAL_COMMUNITY): Payer: Medicare Other

## 2022-08-06 ENCOUNTER — Encounter (HOSPITAL_COMMUNITY)
Admission: EM | Disposition: A | Discharge status: Skilled Nursing Facility | Payer: Self-pay | Source: Home / Self Care | Attending: Family Medicine

## 2022-08-06 ENCOUNTER — Inpatient Hospital Stay (HOSPITAL_COMMUNITY): Payer: Medicare Other | Admitting: Anesthesiology

## 2022-08-06 ENCOUNTER — Other Ambulatory Visit: Payer: Self-pay

## 2022-08-06 DIAGNOSIS — Z7984 Long term (current) use of oral hypoglycemic drugs: Secondary | ICD-10-CM

## 2022-08-06 DIAGNOSIS — I25119 Atherosclerotic heart disease of native coronary artery with unspecified angina pectoris: Secondary | ICD-10-CM

## 2022-08-06 DIAGNOSIS — I5031 Acute diastolic (congestive) heart failure: Secondary | ICD-10-CM | POA: Diagnosis not present

## 2022-08-06 DIAGNOSIS — N185 Chronic kidney disease, stage 5: Secondary | ICD-10-CM

## 2022-08-06 DIAGNOSIS — I132 Hypertensive heart and chronic kidney disease with heart failure and with stage 5 chronic kidney disease, or end stage renal disease: Secondary | ICD-10-CM

## 2022-08-06 DIAGNOSIS — J9601 Acute respiratory failure with hypoxia: Secondary | ICD-10-CM | POA: Diagnosis not present

## 2022-08-06 DIAGNOSIS — Z992 Dependence on renal dialysis: Secondary | ICD-10-CM

## 2022-08-06 DIAGNOSIS — I2510 Atherosclerotic heart disease of native coronary artery without angina pectoris: Secondary | ICD-10-CM | POA: Diagnosis not present

## 2022-08-06 DIAGNOSIS — I5030 Unspecified diastolic (congestive) heart failure: Secondary | ICD-10-CM

## 2022-08-06 DIAGNOSIS — Z87891 Personal history of nicotine dependence: Secondary | ICD-10-CM

## 2022-08-06 DIAGNOSIS — E1122 Type 2 diabetes mellitus with diabetic chronic kidney disease: Secondary | ICD-10-CM

## 2022-08-06 DIAGNOSIS — N184 Chronic kidney disease, stage 4 (severe): Secondary | ICD-10-CM | POA: Diagnosis not present

## 2022-08-06 DIAGNOSIS — N186 End stage renal disease: Secondary | ICD-10-CM

## 2022-08-06 HISTORY — PX: INSERTION OF DIALYSIS CATHETER: SHX1324

## 2022-08-06 LAB — RENAL FUNCTION PANEL
Albumin: 2.9 g/dL — ABNORMAL LOW (ref 3.5–5.0)
Albumin: 3.1 g/dL — ABNORMAL LOW (ref 3.5–5.0)
Anion gap: 12 (ref 5–15)
Anion gap: 17 — ABNORMAL HIGH (ref 5–15)
BUN: 128 mg/dL — ABNORMAL HIGH (ref 8–23)
BUN: 149 mg/dL — ABNORMAL HIGH (ref 8–23)
CO2: 28 mmol/L (ref 22–32)
CO2: 21 mmol/L — ABNORMAL LOW (ref 22–32)
Calcium: 10.1 mg/dL (ref 8.9–10.3)
Calcium: 9.7 mg/dL (ref 8.9–10.3)
Chloride: 110 mmol/L (ref 98–111)
Chloride: 109 mmol/L (ref 98–111)
Creatinine, Ser: 4.88 mg/dL — ABNORMAL HIGH (ref 0.61–1.24)
Creatinine, Ser: 4.52 mg/dL — ABNORMAL HIGH (ref 0.61–1.24)
GFR, Estimated: 12 mL/min — ABNORMAL LOW (ref >60)
GFR, Estimated: 13 mL/min — ABNORMAL LOW (ref >60)
Glucose, Bld: 174 mg/dL — ABNORMAL HIGH (ref 70–99)
Glucose, Bld: 357 mg/dL — ABNORMAL HIGH (ref 70–99)
Phosphorus: 4.7 mg/dL — ABNORMAL HIGH (ref 2.5–4.6)
Phosphorus: 3.7 mg/dL (ref 2.5–4.6)
Potassium: 4.7 mmol/L (ref 3.5–5.1)
Potassium: 4.5 mmol/L (ref 3.5–5.1)
Sodium: 150 mmol/L — ABNORMAL HIGH (ref 135–145)
Sodium: 147 mmol/L — ABNORMAL HIGH (ref 135–145)

## 2022-08-06 LAB — CBC
HCT: 27.2 % — ABNORMAL LOW (ref 39.0–52.0)
HCT: 28.2 % — ABNORMAL LOW (ref 39.0–52.0)
Hemoglobin: 8.5 g/dL — ABNORMAL LOW (ref 13.0–17.0)
Hemoglobin: 8.6 g/dL — ABNORMAL LOW (ref 13.0–17.0)
MCH: 31.5 pg (ref 26.0–34.0)
MCH: 31.4 pg (ref 26.0–34.0)
MCHC: 31.3 g/dL (ref 30.0–36.0)
MCHC: 30.5 g/dL (ref 30.0–36.0)
MCV: 100.7 fL — ABNORMAL HIGH (ref 80.0–100.0)
MCV: 102.9 fL — ABNORMAL HIGH (ref 80.0–100.0)
Platelets: 362 10*3/uL (ref 150–400)
Platelets: 317 10*3/uL (ref 150–400)
RBC: 2.7 MIL/uL — ABNORMAL LOW (ref 4.22–5.81)
RBC: 2.74 MIL/uL — ABNORMAL LOW (ref 4.22–5.81)
RDW: 17 % — ABNORMAL HIGH (ref 11.5–15.5)
RDW: 17.2 % — ABNORMAL HIGH (ref 11.5–15.5)
WBC: 12 10*3/uL — ABNORMAL HIGH (ref 4.0–10.5)
WBC: 9.6 10*3/uL (ref 4.0–10.5)
nRBC: 0.2 % (ref 0.0–0.2)
nRBC: 0 % (ref 0.0–0.2)

## 2022-08-06 LAB — CULTURE, BLOOD (ROUTINE X 2): Culture: NO GROWTH

## 2022-08-06 LAB — GLUCOSE, CAPILLARY
Glucose-Capillary: 166 mg/dL — ABNORMAL HIGH (ref 70–99)
Glucose-Capillary: 220 mg/dL — ABNORMAL HIGH (ref 70–99)
Glucose-Capillary: 316 mg/dL — ABNORMAL HIGH (ref 70–99)
Glucose-Capillary: 275 mg/dL — ABNORMAL HIGH (ref 70–99)

## 2022-08-06 LAB — HEPATITIS B SURFACE ANTIGEN: Hepatitis B Surface Ag: NONREACTIVE

## 2022-08-06 LAB — HEPATITIS C ANTIBODY: HCV Ab: NONREACTIVE

## 2022-08-06 LAB — HEPATITIS B SURFACE ANTIBODY,QUALITATIVE: Hep B S Ab: NONREACTIVE

## 2022-08-06 LAB — HEPATITIS B CORE ANTIBODY, TOTAL: Hep B Core Total Ab: NONREACTIVE

## 2022-08-06 SURGERY — INSERTION OF DIALYSIS CATHETER
Anesthesia: Monitor Anesthesia Care | Site: Chest

## 2022-08-06 MED ORDER — HEPARIN 6000 UNIT IRRIGATION SOLUTION
Status: DC | PRN
  Administered 2022-08-06: 1

## 2022-08-06 MED ORDER — HYDROMORPHONE HCL 1 MG/ML IJ SOLN: 0.2500 mg | INTRAMUSCULAR | Status: DC | PRN

## 2022-08-06 MED ORDER — PROPOFOL 10 MG/ML IV BOLUS
INTRAVENOUS | Status: AC
  Filled 2022-08-06: qty 20

## 2022-08-06 MED ORDER — LIDOCAINE-EPINEPHRINE 0.5 %-1:200000 IJ SOLN
INTRAMUSCULAR | Status: AC
  Filled 2022-08-06: qty 1

## 2022-08-06 MED ORDER — HEPARIN SODIUM (PORCINE) 1000 UNIT/ML IJ SOLN
INTRAMUSCULAR | Status: DC | PRN
  Administered 2022-08-06: 4000 [IU] via INTRAVENOUS

## 2022-08-06 MED ORDER — DEXTROSE 5 % IV SOLN: INTRAVENOUS | Status: DC

## 2022-08-06 MED ORDER — SODIUM CHLORIDE 0.9 % IV SOLN
Freq: Once | INTRAVENOUS | Status: AC
  Administered 2022-08-06: 1000 mL via INTRAVENOUS

## 2022-08-06 MED ORDER — CHLORHEXIDINE GLUCONATE CLOTH 2 % EX PADS
6.0000 | MEDICATED_PAD | Freq: Every day | CUTANEOUS | Status: DC
  Administered 2022-08-06 – 2022-08-07 (×2): 6 via TOPICAL

## 2022-08-06 MED ORDER — ONDANSETRON 4 MG PO TBDP: 8.0000 mg | ORAL_TABLET | Freq: Three times a day (TID) | ORAL | Status: DC | PRN

## 2022-08-06 MED ORDER — ORAL CARE MOUTH RINSE: 15.0000 mL | Freq: Once | OROMUCOSAL | Status: AC

## 2022-08-06 MED ORDER — PROPOFOL 10 MG/ML IV BOLUS
INTRAVENOUS | Status: DC | PRN
  Administered 2022-08-06 (×2): 10 mg via INTRAVENOUS

## 2022-08-06 MED ORDER — METOCLOPRAMIDE HCL 5 MG/ML IJ SOLN
10.0000 mg | Freq: Once | INTRAMUSCULAR | Status: AC
  Administered 2022-08-06: 10 mg via INTRAVENOUS

## 2022-08-06 MED ORDER — METOCLOPRAMIDE HCL 5 MG/ML IJ SOLN
INTRAMUSCULAR | Status: AC
  Filled 2022-08-06: qty 2

## 2022-08-06 MED ORDER — CHLORHEXIDINE GLUCONATE CLOTH 2 % EX PADS
6.0000 | MEDICATED_PAD | Freq: Every day | CUTANEOUS | Status: DC
  Administered 2022-08-06 – 2022-08-09 (×4): 6 via TOPICAL

## 2022-08-06 MED ORDER — HEPARIN SODIUM (PORCINE) 1000 UNIT/ML IJ SOLN
INTRAMUSCULAR | Status: AC
  Filled 2022-08-06: qty 10

## 2022-08-06 MED ORDER — FENTANYL CITRATE (PF) 100 MCG/2ML IJ SOLN
INTRAMUSCULAR | Status: DC | PRN
  Administered 2022-08-06 (×2): 25 ug via INTRAVENOUS

## 2022-08-06 MED ORDER — FENTANYL CITRATE (PF) 100 MCG/2ML IJ SOLN
INTRAMUSCULAR | Status: AC
  Filled 2022-08-06: qty 2

## 2022-08-06 MED ORDER — LIDOCAINE-EPINEPHRINE 0.5 %-1:200000 IJ SOLN
INTRAMUSCULAR | Status: DC | PRN
  Administered 2022-08-06: 13 mL

## 2022-08-06 MED ORDER — ONDANSETRON 4 MG PO TBDP
4.0000 mg | ORAL_TABLET | Freq: Three times a day (TID) | ORAL | Status: DC | PRN
Start: 2022-08-06 — End: 2022-08-11

## 2022-08-06 MED ORDER — PHENYLEPHRINE HCL-NACL 20-0.9 MG/250ML-% IV SOLN
INTRAVENOUS | Status: AC
  Filled 2022-08-06: qty 250

## 2022-08-06 MED ORDER — LACTATED RINGERS IV SOLN: INTRAVENOUS | Status: DC

## 2022-08-06 MED ORDER — CHLORHEXIDINE GLUCONATE 0.12 % MT SOLN
15.0000 mL | Freq: Once | OROMUCOSAL | Status: AC
  Administered 2022-08-06: 15 mL via OROMUCOSAL

## 2022-08-06 SURGICAL SUPPLY — 37 items
ADH SKN CLS APL DERMABOND .7 (GAUZE/BANDAGES/DRESSINGS) ×1
BIOPATCH RED 1 DISK 7.0 (GAUZE/BANDAGES/DRESSINGS) ×2 IMPLANT
CATH PALINDROME-P 23CM W/VT (CATHETERS) IMPLANT
COVER LIGHT HANDLE STERIS (MISCELLANEOUS) ×4 IMPLANT
COVER PROBE U/S 5X48 (MISCELLANEOUS) ×2 IMPLANT
COVER SURGICAL LIGHT HANDLE (MISCELLANEOUS) ×2 IMPLANT
DERMABOND ADVANCED (GAUZE/BANDAGES/DRESSINGS) ×1
DERMABOND ADVANCED .7 DNX12 (GAUZE/BANDAGES/DRESSINGS) ×1 IMPLANT
DRAPE C-ARM FOLDED MOBILE STRL (DRAPES) ×2 IMPLANT
DRAPE CHEST BREAST 15X10 FENES (DRAPES) ×2 IMPLANT
DRSG SORBAVIEW 3.5X5-5/16 MED (GAUZE/BANDAGES/DRESSINGS) IMPLANT
GAUZE 4X4 16PLY ~~LOC~~+RFID DBL (SPONGE) IMPLANT
GAUZE SPONGE 4X4 12PLY STRL (GAUZE/BANDAGES/DRESSINGS) ×2 IMPLANT
GEL ULTRASOUND 20GR AQUASONIC (MISCELLANEOUS) ×2 IMPLANT
GLOVE BIOGEL PI IND STRL 7.0 (GLOVE) ×4 IMPLANT
GLOVE BIOGEL PI INDICATOR 7.0 (GLOVE) ×2
GLOVE SURG MICRO LTX SZ7.5 (GLOVE) ×2 IMPLANT
GOWN STRL REUS W/TWL LRG LVL3 (GOWN DISPOSABLE) ×4 IMPLANT
IV NS 500ML (IV SOLUTION) ×1
IV NS 500ML BAXH (IV SOLUTION) ×2 IMPLANT
KIT BLADEGUARD II DBL (SET/KITS/TRAYS/PACK) IMPLANT
KIT TURNOVER KIT A (KITS) ×2 IMPLANT
MANIFOLD NEPTUNE II (INSTRUMENTS) ×2 IMPLANT
NDL 18GX1X1/2 (RX/OR ONLY) (NEEDLE) ×2 IMPLANT
NDL HYPO 25GX1X1/2 BEV (NEEDLE) ×1 IMPLANT
NEEDLE 18GX1X1/2 (RX/OR ONLY) (NEEDLE) ×1 IMPLANT
NEEDLE HYPO 25GX1X1/2 BEV (NEEDLE) ×1 IMPLANT
PACK SURGICAL SETUP 50X90 (CUSTOM PROCEDURE TRAY) ×2 IMPLANT
PAD ARMBOARD 7.5X6 YLW CONV (MISCELLANEOUS) ×4 IMPLANT
SET BASIN LINEN APH (SET/KITS/TRAYS/PACK) ×2 IMPLANT
SOAP 2 % CHG 4 OZ (WOUND CARE) ×2 IMPLANT
SUT ETHILON 3 0 PS 1 (SUTURE) ×1 IMPLANT
SUT VICRYL 4-0 PS2 18IN ABS (SUTURE) ×2 IMPLANT
SYR 10ML LL (SYRINGE) ×2 IMPLANT
SYR 5ML LL (SYRINGE) ×4 IMPLANT
SYR CONTROL 10ML LL (SYRINGE) ×2 IMPLANT
TOWEL GREEN STERILE (TOWEL DISPOSABLE) ×1 IMPLANT

## 2022-08-06 NOTE — TOC Progression Note (Signed)
Transition of Care [facility]) - Progression Note    Patient Details  Name: [name] MRN: [id number] Date of Birth: [birth year]  [facility]) CM/SW Contact  Shade Flood, LCSW Phone Number: 08/06/2022, 12:51 PM  Clinical Narrative:     TOC following. Pt had HD catheter placed today and first HD treatment will be tomorrow. Spoke with pt and family at bedside to review SNF bed offers and HD clinic choices. Pt elects [facility] for SNF and [facility] for HD.   Updated Debbie at [facility]. TOC team will start HD CLIP tomorrow once first HD has been completed and also Hep panel results are available.  DC timeframe not yet known. Will follow.  Expected Discharge Plan: [place] Barriers to Discharge: Continued Medical Work up  Expected Discharge Plan and Services Expected Discharge Plan: [place] In-house Referral: Clinical Social Work   Post Acute Care Choice: [place] Living arrangements for the past 2 months: [place] Determinants of Health (SDOH) Interventions    Readmission Risk Interventions    08/02/2022    2:23 PM  Readmission Risk Prevention Plan  Transportation Screening Complete  Medication Review (RN Care Manager) Complete  HRI or Home Care Consult Complete  SW Recovery Care/Counseling Consult Complete  Palliative Care Screening Not Applicable  Skilled Nursing Facility Complete

## 2022-08-06 NOTE — Transfer of Care (Signed)
Immediate Anesthesia Transfer of Care Note  Patient: [name]  Procedure(s) Performed: INSERTION OF DIALYSIS CATHETER (Chest)  Patient Location: PACU  Anesthesia Type:MAC  Level of Consciousness: awake  Airway & Oxygen Therapy: Patient Spontanous Breathing  Post-op Assessment: Report given to RN  Post vital signs: Reviewed and stable  Last Vitals:  Vitals Value Taken Time  BP 162/71 08/06/22 0819  Temp 98.1   Pulse 79 08/06/22 0823  Resp 9 08/06/22 0823  SpO2 98 % 08/06/22 0823  Vitals shown include unvalidated device data.  Last Pain:  Vitals:   08/06/22 0704  TempSrc: Oral  PainSc: 8       Patients Stated Pain Goal: 9 (70/78/67 5449)  Complications: No notable events documented.

## 2022-08-06 NOTE — Op Note (Signed)
    OPERATIVE REPORT  DATE OF SURGERY: 08/06/2022  PATIENT: [name], 76 y.o. male MRN: [id number]  DOB: [birth year]  PRE-OPERATIVE DIAGNOSIS: End-stage renal disease  POST-OPERATIVE DIAGNOSIS:  Same  PROCEDURE: Right IJ tunneled hemodialysis catheter with SonoSite visualization  SURGEON:  Curt Jews, M.D.  PHYSICIAN ASSISTANT: Nurse  The assistant was needed for exposure and to expedite the case  ANESTHESIA: Local with sedation  EBL: per anesthesia record  Total I/O In: 20 [I.V.:20] Out: 0   BLOOD ADMINISTERED: none  DRAINS: none  SPECIMEN: none  COUNTS CORRECT:  YES  PATIENT DISPOSITION:  PACU - hemodynamically stable  PROCEDURE DETAILS: Patient was taken operating placed supine additionally the area of the right and left neck and chest were prepped draped you sterile fashion.  SonoSite ultrasound was used to visualize the veins in the neck.  This revealed widely patent jugular veins bilaterally.  Using local anesthesia and an 18-gauge needle under direct SonoSite visualization, the right internal jugular vein was accessed with an 18-gauge needle.  A guidewire was passed centrally and this was confirmed with fluoroscopy.  A dilator and peel-away sheath were passed over the guidewire and the dilator and guidewire were removed.  A separate stab incision was made in the infraclavicular area and a 23 cm catheter was brought through the subcutaneous tunnel to the peel-away sheath.  The catheter was passed down the peel-away sheath and the peel-away sheath was removed.  The catheter was positioned with the tips in the distal right atrium.  Both lumens flushed and aspirated easily and were locked with 1000 unit/cc heparin.  The catheter was secured to the skin with a 3-0 nylon stitch and the entry site was closed with a 4-0 subcuticular Vicryl stitch.  Sterile dressing was applied and the patient was transferred to the recovery room with chest x-ray pending   Rosetta Posner,  M.D., [facility] 08/06/2022 8:22 AM  Note: Portions of this report may have been transcribed using voice recognition software.  Every effort has been made to ensure accuracy; however, inadvertent computerized transcription errors may still be present.

## 2022-08-06 NOTE — Progress Notes (Signed)
Patient pulled IV out, multiple attempts have been made to get a new IV with no luck.   MD made aware, PO zofran ordered.

## 2022-08-06 NOTE — Progress Notes (Signed)
Physical Therapy Treatment Patient Details Name: [name] MRN: [id number] DOB: 1946/08/11 Today's Date: 08/06/2022   History of Present Illness FARMER MCCAHILL is a 76 y.o. male with medical history significant of hypertension, hyperlipidemia, diastolic heart failure, CAD s/p stent, CKD stage IV, and obesity who presents with complaints of shortness of breath and chest pain starting 2 days ago.  Chest pain is substernal and feels like pressure. He chronically has orthopnea and has a bed that he always sleeps sitting up.  He has been feeling short of breath even when he is not doing anything which is not normal for him.  Denies having any fever, nausea, vomiting, diarrhea, abdominal pain, or diaphoresis.    PT Comments    Patient presents seated in chair (assisted by nursing staff) and agreeable for therapy.  Patient demonstrates fair return for completing BLE ROM/strengthening exercises requiring active assistance for completing LAQ's and frequent verbal cueing for completing most exercises.  Patient had most difficulty completing sit to stands due to BLE weakness, once standing able to take a few side steps and steps forward/backwards before having to sit  due to fatigue and legs giving way.  Patient tolerated staying up in chair after therapy with his niece present in room.  Patient will benefit from continued skilled physical therapy in hospital and recommended venue below to increase strength, balance, endurance for safe ADLs and gait.     Recommendations for follow up therapy are one component of a multi-disciplinary discharge planning process, led by the attending physician.  Recommendations may be updated based on patient status, additional functional criteria and insurance authorization.  Follow Up Recommendations  Skilled nursing-short term rehab (<3 hours/day) Can patient physically be transported by private vehicle: No   Assistance Recommended at Discharge Intermittent  Supervision/Assistance  Patient can return home with the following A lot of help with walking and/or transfers;A lot of help with bathing/dressing/bathroom;Help with stairs or ramp for entrance;Assistance with cooking/housework   Equipment Recommendations  None recommended by PT    Recommendations for Other Services       Precautions / Restrictions Precautions Precautions: Fall Restrictions Weight Bearing Restrictions: No     Mobility  Bed Mobility               General bed mobility comments: Patient presents seated in chair (assisted by nursing staff)    Transfers Overall transfer level: Needs assistance Equipment used: Rolling walker (2 wheels) Transfers: Sit to/from Stand Sit to Stand: Mod assist, Max assist           General transfer comment: slow labored movement with diffuculty holding onto RW with 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    Ambulation/Gait Ambulation/Gait assistance: Mod assist Gait Distance (Feet): 10 Feet Assistive device: Rolling walker (2 wheels) Gait Pattern/deviations: Decreased step length - right, Decreased step length - left, Decreased stride length Gait velocity: slow     General Gait Details: limited to a few slow labored side steps, steps forward/backwards at bedside before having to sit due to fatigue and generalized weakness   Stairs             Wheelchair Mobility    Modified Rankin (Stroke Patients Only)       Balance Overall balance assessment: Needs assistance Sitting-balance support: Feet supported, No upper extremity supported Sitting balance-Leahy Scale: Fair Sitting balance - Comments: fair/good seated in chair   Standing balance support: During functional activity, Reliant on assistive device for balance, Bilateral upper extremity supported  Standing balance-Leahy Scale: Poor Standing balance comment: fair/poor using RW                            Cognition Arousal/Alertness:  Awake/alert Behavior During Therapy: WFL for tasks assessed/performed Overall Cognitive Status: Within Functional Limits for tasks assessed                                          Exercises General Exercises - Lower Extremity Long Arc Quad: Seated, AAROM, Strengthening, Both, 10 reps Hip Flexion/Marching: Seated, AROM, Strengthening, Both, 10 reps Toe Raises: Seated, AROM, Strengthening, Both, 10 reps Heel Raises: Seated, AROM, Strengthening, Both, 10 reps    General Comments        Pertinent Vitals/Pain Pain Assessment Pain Assessment: No/denies pain    Home Living                          Prior Function            PT Goals (current goals can now be found in the care plan section) Acute Rehab PT Goals Patient Stated Goal: Go to rehab and go home PT Goal Formulation: With patient Time For Goal Achievement: 08/15/22 Potential to Achieve Goals: Good Progress towards PT goals: Progressing toward goals    Frequency    Min 3X/week      PT Plan Current plan remains appropriate    Co-evaluation              AM-PAC PT "6 Clicks" Mobility   Outcome Measure  Help needed turning from your back to your side while in a flat bed without using bedrails?: A Lot Help needed moving from lying on your back to sitting on the side of a flat bed without using bedrails?: A Lot Help needed moving to and from a bed to a chair (including a wheelchair)?: A Lot Help needed standing up from a chair using your arms (e.g., wheelchair or bedside chair)?: A Lot Help needed to walk in hospital room?: A Lot Help needed climbing 3-5 steps with a railing? : Total 6 Click Score: 11    End of Session   Activity Tolerance: Patient tolerated treatment well;Patient limited by fatigue Patient left: in chair;with call bell/phone within reach Nurse Communication: Mobility status PT Visit Diagnosis: Unsteadiness on feet (R26.81);Other abnormalities of gait and  mobility (R26.89);Muscle weakness (generalized) (M62.81)     Time: 2595-6387 PT Time Calculation (min) (ACUTE ONLY): 21 min  Charges:  $Therapeutic Exercise: 8-22 mins $Therapeutic Activity: 8-22 mins                     3:40 PM, 08/06/22 [name], MPT Physical Therapist with [facility] [phone] office [phone] mobile phone

## 2022-08-06 NOTE — Progress Notes (Signed)
Pharmacy Antibiotic Note  [name] a 76 y.o. male admitted on 07/29/22 with UTI.  Pharmacy has been consulted for cefepime dosing.  Plan: Cefepime 2gm IV every 24 hours  Medical History: Past Medical History:  Diagnosis Date   Anginal pain ([place])    Anxiety    Benign prostatic hypertrophy    Nocturia   CAD (coronary artery disease)    a.  NSTEMI 10/2012 s/p DES to LAD & DES to 1st diagonal with residual diffuse nonobstructive dz in LCx/RCA    Chronic kidney disease, stage 3, mod decreased GFR (HCC)    Creatinine of 1.5 in 03/2009, STAGE 4 now   Degenerative joint disease    Depression    hx of   Diabetes mellitus, type II ([place]) 33/82/5053   Diastolic heart failure    Pedal edema, "a long time ago"   Erectile dysfunction    Flu 2013   hx of   GERD (gastroesophageal reflux disease)    Gout    Hemorrhoids    High cholesterol    Hypertension    Exercise induced   Myocardial infarction ([place])    Neuropathy    bilateral legs   NSVT (nonsustained ventricular tachycardia) ([place])    Exercise induced   Obesity    Tobacco abuse, 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, quit 1994   Tubular adenoma 2014   Vertigo    everyday    Allergies:  Allergies  Allergen Reactions   Metformin Nausea And Vomiting   Niacin    Niacin And Related Other (See Comments)    Reaction: flushing and burning side effects even with extended release    Filed Weights   08/04/22 0500 08/05/22 0523 08/06/22 0704  Weight: 111.8 kg (246 lb 7.6 oz) 110.6 kg (243 lb 13.3 oz) 110.6 kg (243 lb 13.3 oz)       Latest Ref Rng & Units 08/06/2022    4:30 AM 08/05/2022    5:09 AM 08/04/2022    7:25 PM  CBC  WBC 4.0 - 10.5 K/uL 12.0  9.9    Hemoglobin 13.0 - 17.0 g/dL 8.5  8.0  8.5   Hematocrit 39.0 - 52.0 % 27.2  25.8  27.0   Platelets 150 - 400 K/uL 362  286       Estimated Creatinine Clearance: 16.8 mL/min (A) (by C-G formula based on SCr of 4.88 mg/dL (H)).  Antibiotics Given (last 72 hours)      Date/Time Action Medication Dose Rate   08/03/22 0919 New Bag/Given   vancomycin (VANCOREADY) IVPB 2000 mg/400 mL 2,000 mg 200 mL/hr   08/03/22 1657 New Bag/Given   ceFEPIme (MAXIPIME) 2 g in sodium chloride 0.9 % 100 mL IVPB 2 g 200 mL/hr   08/04/22 0310 New Bag/Given   ceFEPIme (MAXIPIME) 1 g in sodium chloride 0.9 % 100 mL IVPB 1 g 200 mL/hr   08/05/22 0919 New Bag/Given   vancomycin (VANCOREADY) IVPB 1500 mg/300 mL 1,500 mg 150 mL/hr   08/06/22 0441 New Bag/Given   ceFEPIme (MAXIPIME) 1 g in sodium chloride 0.9 % 100 mL IVPB 1 g 200 mL/hr       Antimicrobials this admission:  Cefepime 08/03/2022  >>  Merrem 8/10 >> 8/12 Vancomycin 8/12>> 8/14  Microbiology results: 08/01/2022  BCx: 2 bottles 1 set staph epidermidis and staph hominis, likely contaminant Other bottle is ngtd 08/01/2022  UCx: >100k pseudomonas S-pan sensitive 08/03/2022  Resp Panel: sent  07/29/2022  MRSA PCR: neg  Thank you for allowing pharmacy to be a part of this patient's care.  Ramond Craver, PharmD Clinical Pharmacist

## 2022-08-06 NOTE — Progress Notes (Signed)
Progress Note   Patient: [name] TTS:[id number] DOB: [birth year] DOA: 07/28/2022     8 DOS: the patient was seen and examined on 08/06/2022     Subjective:  The patient was seen and examined this morning, stable no acute distress, status post HD catheter placement Dr. Donnetta Hutching this morning He tolerated procedure well  Brothers, family present at bedside, plan of care including proceed with hemodialysis with nephrology in a.m. was discussed  Plan of care overall patient's condition were discussed with the patient's brother at bedside who patient wishes to be his POA    Brief hospital course: [name] is a 76 y.o. male with medical history significant of hypertension, hyperlipidemia, diastolic heart failure, CAD s/p stent, CKD stage IV, and obesity who presents with complaints of shortness of breath and chest pain starting 2 days ago.  Chest pain is substernal and feels like pressure. He chronically has orthopnea and has a bed that he always sleeps sitting up.  He has been feeling short of breath even when he is not doing anything which is not normal for him.  Denies having any fever, nausea, vomiting, diarrhea, abdominal pain, or diaphoresis.   Patient reports that he is followed by Dr. Harl Bowie of cardiology and at the end of June had been recommended to increase his Imdur to 60 mg twice daily.  He states that this initially helped decrease symptoms of chest pain.  Then he was told to start taking furosemide 40 mg twice daily by his nephrologist Dr. Theador Hawthorne on 6/12 and was started on torsemide 100 mg daily.  Since doing that patient reports that he has been swelling more.  He developed a blister on his left leg due to the swelling and he has been putting ointment on it.  Denies having any redness or erythema from the wound.   Upon admission into the emergency patient was seen to be afebrile with blood pressures 140/82 to 158/73 and all other vital signs maintained.  Labs significant for  WBC 11.6, hemoglobin 8.2, potassium 3.2, BUN 74, creatinine 3.38, glucose 170, BNP 651, and high-sensitivity troponin 42. Chest x-ray showed cardiomegaly with vascular congestion.  Patient has been given Lasix 40 mg IV, potassium chloride 40 mEq, and magnesium oxide 800 mg p.o.   07/29/2022: Pt decompensated on med surg unit and rapid response called and patient placed on bipap and transferred to stepdown ICU. Cardiology and nephrology consulted and patient started on maximal dose IV lasix by nephrology.  Pt noted to be in atrial fibrillation.  Family was called by MD and updated with change in status and treatment plan.     07/30/2022: Pt was intubated on 8/7 after failed bipap trial.  Afib converted to NSR.  He has diuresed >3L on high dose lasix therapy.     08/02/22--pt requested opioids.  After verifying with PCP pt has been receiving chronic norco, it was restarted.  Continues to have nausea--suspect partly uremia from renal failure.   Start zofran OTC.  Reglan IV x 1  08/03/2022-blood culture (8/10)-> MRSA, Urine culture (8/10)-> pseudomonas, start vanco iv pharmacy to dose, cefepime iv pharmacy to dose   08/05/2022 -patient current progressively getting worse, mildly confused, anasarca, nephrologist following recommending patient to start hemodialysis, vascular surgeon Dr. Donnetta Hutching was consulted -Palliative care consulted, discussed with niece at bedside -Blood cultures confirmed staph epididymis-MRSA-discontinuing IV vancomycin 8/14  08/06/2022 -underwent HD catheter placement-planning for hemodialysis in a.m. BUN 128, creatinine 4.88 blood pressure elevated, heart rate 79  Overnight noted for 2.6-second pause, first-degree AV block noted in normal sinus rhythm  Assessment and Plan:  Acute respiratory failure with hypoxia  -Remained stable on supplemental oxygen -Currently satting 100% on 2 L of oxygen, continue have mild shortness of breath, anasarca - secondary to acute heart failure  exacerbation and atrial fibrillation - Trial of bipap failed and patient intubated by PCCM Dr. Halford Chessman.  - IV lasix maximal doses ordered per nephrology -with minimal improvement and worsening kidney function - pulmonary follow-up appreciated  - Nephrology following closely -recommending starting dialysis in a.m. - Vascular surgery Dr. Donnetta Hutching was consulted, who placed a hemodialysis catheter 08/06/2022 -Tolerated procedure well, planning for hemodialysis in a.m.   - extubated 8/9>>stable on 4L>>2L   Acute HFrEF - he was massively volume overloaded at presentation -Still volume overload with anasarca -IV lasix 120 mg q 12 ordered by nephrology>> minimal improvement, worsening kidney function and anasarca - further lasix dosing per nephrology - limited echo 8/23: LVEF 35-40%, LV global hypokinesis - weight trending down with diuresis  - follow I/O (now neg 13.5L) - cardiology consultation appreciated>>not cath candidate, continue to follow -Noted for pulse 2.6 seconds   Acute on chronic CKD Stage IV CKD - appreciate nephrology consultation  - baseline creatinine 3.0-3.3 - he agrees to HD if he should needed it -- discussed with pt and brother 8/9 and 8/11 and 8/12 - 8/11 uptrend in serum creatinine to 4.46 - 8/12 uptrend in serum creatinine to 4.90 - 8/13 uptrend in serum creatinine to 5.1 - 8/14 uptrending serum creatinine to 5.02 - 8/15 BUN 128, creatinine 4.88-status post HD catheter placement Planning for hemodialysis on 08/07/2022   Fever/ UTI, Bacteremia -new fevere 101.0 as of 8/10  -8/10 blood cultures x2>>GPC in 1 of 2 sets, staph epididymis -Blood culture 8/10 -> MRSA -1 out of 2 culture positive for staph epididymis -Urine culture 8/10 -> Pseudomonas - Vanco 8/12-> discontinue 8/14 - Cefepime 8/12->    Hypertensive urgency -BP improving, stabilizing  resume home meds and IV hydralazine ordered - Continue coreg, clonidine, hydralazine, imdur - soft BPs noted on  precedex, agree with nephrology, changed BP meds to PRN to avoid prolonged low BP   Opioid Dependence/chronic back pain -PDMP does not show any recent opioid Rx in last 2 years -verified with PCP office pt is on norco 5/325 q 6 hrs prn>>restart - DC Norco 8/13, not controlling pain and concern for sedation - Start Tramadol 53m po q6h prn pain 8/13  Chest pain / longstanding angina / CAD - appreciate cardiology consult s/p DES to LAD and D1 in 10/2012 with nonobstructive disease along RCA and LCx. Most recent ischemic evaluation was a low-risk NST in 04/2022 - he is being treated medically with ASA 81 mg, coreg, pravastatin - not cath candidate due to CKD   Leukocytosis  - stress reaction likely vs UTI/ bacteremia -Trending down - follow, no s/s of    Hypokalemia resolved - repleted previously - mag 2.1   Essential hypertension  - resumed home carvedilol, isosorbide, hydralazine, Aldactone     Diabetes mellitus, type 2, controlled with renal complications - continue close CBG monitoring and SSI coverage    Paroxysmal Atrial Fibrillation  - new diagnosis this admission -  His CHA2DS2-VASc Score is at least 6. Has not yet been started on anticoagulation, likely due to his chronic anemia  -Unless noted to have definitive atrial fibrillation, would prefer not to commit to long-term anticoagulation.  -cardiology following -not pursuing  chronic anticoagulation therapy   Anemia of chronic renal disease - low threshold to transfuse for Hg<8 as it contributes to chest pain and respiratory distress  - Transfuse 2 units prbc for Hgb 7.1 on 8/13   GERD  - protonix for GI protection    Nausea - cont zofran prn   Hyperlipidemia - resume statin   Gout , increased in generalized pain 8/13 - continue allopurinolol  - watch very closely in setting of aggressive diuresis and CKD  -start low dose prednisone 10m po qday to see if this will help with generalized pain   Obesity class  I - BMI 33.88 - lifestyle modification  Constipation -colace 1047mpo qday x 3 days start 8/13     Ethics: Discussed with patient and his brothers and family at bedside today 08/06/2022 Palliative care consult, patient remains full code  And lieu of multiple comorbidities including acute renal failure, heart failure we are recommending DNR status, pursuing with palliative care-expecting steep decline     Family Communication:   brother updated at bedside 8/15  Consultants:  renal, pulm, cardiology  Code Status:  FULL    DVT Prophylaxis:  [place] Heparin      Procedures: As Listed in Progress Note Above   Antibiotics: Vanco 8/12-> Cefepime 8/12->           Physical Exam: Blood pressure (!) 168/64, pulse 79, temperature 98.1 F (36.7 C), temperature source Oral, resp. rate 15, height 6' 2"  (1.88 m), weight 110.6 kg, SpO2 100 %.     Physical Exam:   General:  AAO x 2,  cooperative, no distress;   HEENT:  Normocephalic, PERRL, otherwise with in Normal limits   Neuro:  CNII-XII intact. , normal motor and sensation, reflexes intact   Lungs:   Clear to auscultation BL, Respirations unlabored,  No wheezes / crackles  Cardio:    S1/S2, RRR, No murmure, No Rubs or Gallops   Abdomen:  Soft, non-tender, bowel sounds active all four quadrants, no guarding or peritoneal signs.  Muscular  skeletal:  Anasarca noted Limited exam -global generalized weaknesses - in bed, able to move all 4 extremities,   2+ pulses,  symmetric, ++2 pitting edema  Skin:  Dry, warm to touch, negative for any Rashes,  Wounds: Please see nursing documentation              Data Reviewed:    Latest Ref Rng & Units 08/06/2022    4:30 AM 08/05/2022    5:09 AM 08/04/2022    7:25 PM  CBC  WBC 4.0 - 10.5 K/uL 12.0  9.9    Hemoglobin 13.0 - 17.0 g/dL 8.5  8.0  8.5   Hematocrit 39.0 - 52.0 % 27.2  25.8  27.0   Platelets 150 - 400 K/uL 362  286        Latest Ref Rng & Units 08/06/2022    4:30 AM  08/05/2022    5:07 AM 08/04/2022    5:28 AM  BMP  Glucose 70 - 99 mg/dL 174  198  166   BUN 8 - 23 mg/dL 128  132  148   Creatinine 0.61 - 1.24 mg/dL 4.88  5.02  5.10   Sodium 135 - 145 mmol/L 150  147  147   Potassium 3.5 - 5.1 mmol/L 4.7  5.0  4.7   Chloride 98 - 111 mmol/L 110  107  105   CO2 22 - 32 mmol/L 28  28  27  Calcium 8.9 - 10.3 mg/dL 10.1  9.5  9.4      Time spent: 45 minutes  Author: Deatra James, MD 08/06/2022 11:14 AM  For on call review [email or web address].

## 2022-08-06 NOTE — H&P (Signed)
For HD cath today Chart reviewed eith no change to H and P.  Discussed with Dr Charna Elizabeth.  Uremic therefore light sedation for catheter Curt Jews MD  Addendum:  The patient has been re-examined and re-evaluated.  The patient's history and physical has been reviewed and is unchanged.    [name] is a 76 y.o. male is being admitted with (HFpEF) heart failure with preserved ejection fraction (HCC) [I50.30] Acute respiratory failure with hypoxia ([place]) [J96.01]. All the risks, benefits and other treatment options have been discussed with the patient. The patient has consented to proceed with Procedure(s): INSERTION OF DIALYSIS CATHETER as a surgical intervention.  Skarlet Lyons 08/06/2022 7:22 AM Vascular and Vein Surgery

## 2022-08-06 NOTE — Progress Notes (Signed)
Subjective:  s/p new TDC placement-  thank you Dr. Donnetta Hutching-  no other major events overnight-  had 1200 of UOP - BUN and crt better but still very abnormal and he is still quite weak  -  sodium climbing-  he is c/o nausea    Objective Vital signs in last 24 hours: Vitals:   08/06/22 0704 08/06/22 0819 08/06/22 0830 08/06/22 0855  BP: (!) 160/63 (!) 162/71 (!) 171/71 (!) 168/64  Pulse: 79 77 74 79  Resp: 15  15 15   Temp: 98.7 F (37.1 C) 98.1 F (36.7 C)  98.1 F (36.7 C)  TempSrc: Oral   Oral  SpO2: 99% 98% 97% 100%  Weight: 110.6 kg     Height: 6' 2"  (1.88 m)      Weight change:   Intake/Output Summary (Last 24 hours) at 08/06/2022 0858 Last data filed at 08/06/2022 6606 Gross per 24 hour  Intake 1193 ml  Output 1200 ml  Net -7 ml    Assessment/Plan: 76 year old BM with DM, HTN, diastolic heart failure,  CAD and CKD-  presenting with SOB and CP   1.AKI- advanced CKD at baseline.  He then had volume overload contributing to respiratory failure.  - There has been no acute indication for dialysis-  he was diuresing well with meds- said he would want if it were to become needed. He definitely is not thriving and I am sure the BUN over 100 is not helping-  I doubt that his numbers will get that much better.  I would favor starting HD for clearance-  he seems agreeable-  I have talked to family as well.  Now s/p [facility]-  will not be able to do first HD until tomorrow due to high ESRD pt volume here at [facility]-  will need placement to accomodate HD as I feel this will be permanent-  ESRD   1a. CKD stage IV - baseline Cr is in the mid 3's recently   2. Hypertension/volume  - Volume status improving. Now with high BUN and high sodium -  will need some D5w via IV as is not alert enough go encourage drinking.  Also on hydralazine 100 tid/coreg 6.25 BID and clonidine 0.1 BID   3. Anemia  - has been getting iron and ESA as outpatient.  S/p IV iron and on aranesp 200 mcg every Tuesday    4.  Hypokalemia-  resolved   5. Acute systolic heart failure  cardiology following.  Medical management recommended at this time per charting. Optimize volume status as above   6. Acute hypoxic resp failure - extubated. Optimize volume status above. Good sats on 2 Liters    Kerrianne Jeng Felicity Coyer    Labs: Basic Metabolic Panel: Recent Labs  Lab 08/04/22 0527 08/04/22 0528 08/05/22 0507 08/06/22 0430  NA 146* 147* 147* 150*  K 4.6 4.7 5.0 4.7  CL 105 105 107 110  CO2 27 27 28 28   GLUCOSE 166* 166* 198* 174*  BUN 146* 148* 132* 128*  CREATININE 4.95* 5.10* 5.02* 4.88*  CALCIUM 9.4 9.4 9.5 10.1  PHOS 5.9*  --  4.9* 4.7*   Liver Function Tests: Recent Labs  Lab 08/04/22 0528 08/05/22 0507 08/06/22 0430  AST 14*  --   --   ALT 12  --   --   ALKPHOS 54  --   --   BILITOT 0.6  --   --   PROT 6.9  --   --  ALBUMIN 2.7* 2.7* 2.9*   No results for input(s): "LIPASE", "AMYLASE" in the last 168 hours. No results for input(s): "AMMONIA" in the last 168 hours. CBC: Recent Labs  Lab 07/31/22 0433 08/02/22 0652 08/04/22 0528 08/04/22 1925 08/05/22 0509 08/06/22 0430  WBC 11.0* 14.1* 11.2*  --  9.9 12.0*  HGB 9.6* 8.1* 7.3* 8.5* 8.0* 8.5*  HCT 30.5* 26.4* 23.2* 27.0* 25.8* 27.2*  MCV 99.3 101.5* 101.8*  --  100.0 100.7*  PLT 227 216 257  --  286 362   Cardiac Enzymes: No results for input(s): "CKTOTAL", "CKMB", "CKMBINDEX", "TROPONINI" in the last 168 hours. CBG: Recent Labs  Lab 08/05/22 0720 08/05/22 1107 08/05/22 1636 08/05/22 1959 08/06/22 0720  GLUCAP 172* 194* 290* 241* 166*    Iron Studies: No results for input(s): "IRON", "TIBC", "TRANSFERRIN", "FERRITIN" in the last 72 hours. Studies/Results: DG Chest Portable 1 View  Result Date: 08/06/2022 CLINICAL DATA:  Line placement, dialysis catheter EXAM: PORTABLE CHEST 1 VIEW COMPARISON:  07/31/2022 FINDINGS: Cardiomegaly. Large-bore right neck multi lumen vascular catheter, tip positioned near the superior  cavoatrial junction. Both lungs are clear. The visualized skeletal structures are unremarkable. IMPRESSION: 1. Cardiomegaly without acute abnormality of the lungs in AP portable projection. 2. Large-bore right neck multi lumen vascular catheter, tip positioned near the superior cavoatrial junction. Electronically Signed   By: Delanna Ahmadi M.D.   On: 08/06/2022 08:38   DG C-Arm 1-60 Min-No Report  Result Date: 08/06/2022 Fluoroscopy was utilized by the requesting physician.  No radiographic interpretation.   Medications: Infusions:  sodium chloride     ceFEPime (MAXIPIME) IV 1 g (08/06/22 0441)   promethazine (PHENERGAN) injection (IM or IVPB) 12.5 mg (08/05/22 1812)    Scheduled Medications:  acetaminophen  1,000 mg Oral Q6H   acetaminophen  650 mg Oral Once   allopurinol  100 mg Oral Daily   aspirin  81 mg Oral Daily   atorvastatin  80 mg Oral Daily   carvedilol  6.25 mg Oral BID WC   cloNIDine  0.1 mg Oral BID   darbepoetin (ARANESP) injection - NON-DIALYSIS  200 mcg Subcutaneous Q Tue-1800   docusate sodium  100 mg Oral Daily   feeding supplement  237 mL Oral BID BM   heparin  5,000 Units Subcutaneous Q8H   hydrALAZINE  100 mg Oral TID   insulin aspart  0-5 Units Subcutaneous QHS   insulin aspart  0-9 Units Subcutaneous TID WC   isosorbide mononitrate  120 mg Oral Daily   ondansetron (ZOFRAN) IV  4 mg Intravenous Q6H   pantoprazole  40 mg Oral BID   predniSONE  20 mg Oral Q breakfast   sodium chloride flush  3 mL Intravenous Q12H    have reviewed scheduled and prn medications.  Physical Exam: General: very weak-  difficulty raising arms-  slow conversation  Heart: RRR Lungs: mostly clear-  poor effort Abdomen: obese, soft, non tender Extremities: non pitting edema  Dialysis Access: new right sided TDC    08/06/2022,8:58 AM  LOS: 8 days

## 2022-08-06 NOTE — Progress Notes (Signed)
    Patient underwent HD catheter placement this morning. Telemetry reviewed and he is maintaining NSR with 1st degree AV Block and episodes of Wenckebach. Nocturnal pauses up to 2.6 seconds so would not further titrate Coreg. Remains on Hydralazine and Imdur for his cardiomyopathy as well. No additional Cardiology recommendations at this time.   Signed, Erma Heritage, PA-C 08/06/2022, 10:11 AM Pager: [phone]

## 2022-08-06 NOTE — Anesthesia Postprocedure Evaluation (Signed)
Anesthesia Post Note  Patient: [name]  Procedure(s) Performed: INSERTION OF DIALYSIS CATHETER (Chest)  Patient location during evaluation: PACU Anesthesia Type: MAC Level of consciousness: awake and alert and oriented Pain management: pain level controlled Vital Signs Assessment: post-procedure vital signs reviewed and stable Respiratory status: spontaneous breathing, nonlabored ventilation and respiratory function stable Cardiovascular status: blood pressure returned to baseline and stable Postop Assessment: no apparent nausea or vomiting Anesthetic complications: no   No notable events documented.   Last Vitals:  Vitals:   08/06/22 0830 08/06/22 0855  BP: (!) 171/71 (!) 168/64  Pulse: 74 79  Resp: 15 15  Temp:  36.7 C  SpO2: 97% 100%    Last Pain:  Vitals:   08/06/22 0855  TempSrc: Oral  PainSc:                  Catherina Pates C Dortha Neighbors

## 2022-08-07 ENCOUNTER — Inpatient Hospital Stay (HOSPITAL_COMMUNITY): Payer: Medicare Other

## 2022-08-07 ENCOUNTER — Inpatient Hospital Stay: Payer: Medicare Other | Admitting: Physician Assistant

## 2022-08-07 DIAGNOSIS — Z515 Encounter for palliative care: Secondary | ICD-10-CM | POA: Diagnosis not present

## 2022-08-07 DIAGNOSIS — D649 Anemia, unspecified: Secondary | ICD-10-CM | POA: Diagnosis not present

## 2022-08-07 DIAGNOSIS — I5033 Acute on chronic diastolic (congestive) heart failure: Secondary | ICD-10-CM | POA: Diagnosis not present

## 2022-08-07 DIAGNOSIS — I2510 Atherosclerotic heart disease of native coronary artery without angina pectoris: Secondary | ICD-10-CM | POA: Diagnosis not present

## 2022-08-07 DIAGNOSIS — N186 End stage renal disease: Secondary | ICD-10-CM | POA: Diagnosis not present

## 2022-08-07 DIAGNOSIS — J9601 Acute respiratory failure with hypoxia: Secondary | ICD-10-CM | POA: Diagnosis not present

## 2022-08-07 DIAGNOSIS — N184 Chronic kidney disease, stage 4 (severe): Secondary | ICD-10-CM | POA: Diagnosis not present

## 2022-08-07 DIAGNOSIS — Z992 Dependence on renal dialysis: Secondary | ICD-10-CM | POA: Diagnosis not present

## 2022-08-07 LAB — CBC
HCT: 27.3 % — ABNORMAL LOW (ref 39.0–52.0)
Hemoglobin: 8.4 g/dL — ABNORMAL LOW (ref 13.0–17.0)
MCH: 30.9 pg (ref 26.0–34.0)
MCHC: 30.8 g/dL (ref 30.0–36.0)
MCV: 100.4 fL — ABNORMAL HIGH (ref 80.0–100.0)
Platelets: 357 10*3/uL (ref 150–400)
RBC: 2.72 MIL/uL — ABNORMAL LOW (ref 4.22–5.81)
RDW: 16.8 % — ABNORMAL HIGH (ref 11.5–15.5)
WBC: 9.9 10*3/uL (ref 4.0–10.5)
nRBC: 0.2 % (ref 0.0–0.2)

## 2022-08-07 LAB — RENAL FUNCTION PANEL
Albumin: 2.9 g/dL — ABNORMAL LOW (ref 3.5–5.0)
Anion gap: 10 (ref 5–15)
BUN: 114 mg/dL — ABNORMAL HIGH (ref 8–23)
CO2: 27 mmol/L (ref 22–32)
Calcium: 9.8 mg/dL (ref 8.9–10.3)
Chloride: 112 mmol/L — ABNORMAL HIGH (ref 98–111)
Creatinine, Ser: 4 mg/dL — ABNORMAL HIGH (ref 0.61–1.24)
GFR, Estimated: 15 mL/min — ABNORMAL LOW (ref >60)
Glucose, Bld: 201 mg/dL — ABNORMAL HIGH (ref 70–99)
Phosphorus: 3.6 mg/dL (ref 2.5–4.6)
Potassium: 4.2 mmol/L (ref 3.5–5.1)
Sodium: 149 mmol/L — ABNORMAL HIGH (ref 135–145)

## 2022-08-07 LAB — HEPATITIS B SURFACE ANTIBODY, QUANTITATIVE: Hep B S AB Quant (Post): 4.4 m[IU]/mL — ABNORMAL LOW (ref >9.9)

## 2022-08-07 LAB — GLUCOSE, CAPILLARY
Glucose-Capillary: 171 mg/dL — ABNORMAL HIGH (ref 70–99)
Glucose-Capillary: 235 mg/dL — ABNORMAL HIGH (ref 70–99)
Glucose-Capillary: 217 mg/dL — ABNORMAL HIGH (ref 70–99)
Glucose-Capillary: 252 mg/dL — ABNORMAL HIGH (ref 70–99)

## 2022-08-07 LAB — BRAIN NATRIURETIC PEPTIDE: B Natriuretic Peptide: 180 pg/mL — ABNORMAL HIGH (ref 0.0–100.0)

## 2022-08-07 MED ORDER — ALTEPLASE 2 MG IJ SOLR: 2.0000 mg | Freq: Once | INTRAMUSCULAR | Status: DC | PRN

## 2022-08-07 MED ORDER — HEPARIN SODIUM (PORCINE) 1000 UNIT/ML IJ SOLN
INTRAMUSCULAR | Status: AC
  Administered 2022-08-07: 3800 [IU]
  Filled 2022-08-07: qty 4

## 2022-08-07 MED ORDER — HYDROMORPHONE HCL 1 MG/ML IJ SOLN
1.0000 mg | INTRAMUSCULAR | Status: DC | PRN
  Administered 2022-08-07 – 2022-08-08 (×4): 1 mg via INTRAVENOUS
  Filled 2022-08-07 (×4): qty 1

## 2022-08-07 MED ORDER — PROMETHAZINE HCL 25 MG/ML IJ SOLN
INTRAMUSCULAR | Status: AC
  Filled 2022-08-07: qty 1

## 2022-08-07 MED ORDER — MAGNESIUM CITRATE PO SOLN
1.0000 | Freq: Once | ORAL | Status: AC
  Administered 2022-08-07: 1 via ORAL
  Filled 2022-08-07: qty 296

## 2022-08-07 MED ORDER — AMLODIPINE BESYLATE 5 MG PO TABS
10.0000 mg | ORAL_TABLET | Freq: Every day | ORAL | Status: DC
  Administered 2022-08-08 – 2022-08-10 (×3): 10 mg via ORAL
  Filled 2022-08-07 (×3): qty 2

## 2022-08-07 MED ORDER — PANTOPRAZOLE SODIUM 40 MG PO TBEC
40.0000 mg | DELAYED_RELEASE_TABLET | Freq: Every day | ORAL | Status: DC
  Administered 2022-08-08 – 2022-08-10 (×3): 40 mg via ORAL
  Filled 2022-08-07 (×3): qty 1

## 2022-08-07 MED ORDER — MAGNESIUM CITRATE PO SOLN
1.0000 | Freq: Once | ORAL | Status: DC
Start: 2022-08-07 — End: 2022-08-07

## 2022-08-07 MED ORDER — SORBITOL 70 % SOLN
960.0000 mL | TOPICAL_OIL | Freq: Once | ORAL | Status: DC
  Filled 2022-08-07: qty 473

## 2022-08-07 MED ORDER — HEPARIN SODIUM (PORCINE) 1000 UNIT/ML DIALYSIS
1000.0000 [IU] | INTRAMUSCULAR | Status: DC | PRN
Start: 2022-08-07 — End: 2022-08-11
  Administered 2022-08-08: 3000 [IU]

## 2022-08-07 MED ORDER — POLYETHYLENE GLYCOL 3350 17 G PO PACK
17.0000 g | PACK | Freq: Every day | ORAL | Status: DC
  Administered 2022-08-08 – 2022-08-10 (×2): 17 g via ORAL
  Filled 2022-08-07 (×3): qty 1

## 2022-08-07 MED ORDER — INSULIN GLARGINE-YFGN 100 UNIT/ML ~~LOC~~ SOLN
5.0000 [IU] | Freq: Every day | SUBCUTANEOUS | Status: DC
  Administered 2022-08-07 – 2022-08-09 (×3): 5 [IU] via SUBCUTANEOUS
  Filled 2022-08-07 (×4): qty 0.05

## 2022-08-07 MED ORDER — AMLODIPINE BESYLATE 5 MG PO TABS
5.0000 mg | ORAL_TABLET | Freq: Every day | ORAL | Status: DC
Start: 2022-08-07 — End: 2022-08-07
  Administered 2022-08-07: 5 mg via ORAL
  Filled 2022-08-07: qty 1

## 2022-08-07 MED ORDER — PREDNISONE 10 MG PO TABS
10.0000 mg | ORAL_TABLET | Freq: Every day | ORAL | Status: DC
Start: 2022-08-08 — End: 2022-08-09
  Administered 2022-08-08: 10 mg via ORAL
  Filled 2022-08-07: qty 1

## 2022-08-07 MED ORDER — SODIUM CHLORIDE 0.9 % IV SOLN
12.5000 mg | INTRAVENOUS | Status: DC | PRN
  Filled 2022-08-07: qty 0.5

## 2022-08-07 NOTE — Progress Notes (Signed)
    Interval notes reviewed and the patient is going to start HD today. Telemetry reviewed and still maintaining NSR with frequent PVC's. Remains on Coreg, Hydralazine and Nitrates. He has previously scheduled follow-up with Dr. Harl Bowie for tomorrow but given he will likely remain admitted for his 2nd HD session to be performed either Thursday or Friday, will cancel this and can reschedule follow-up once closer to discharge.   Signed, Erma Heritage, PA-C 08/07/2022, 8:06 AM Pager: [phone]

## 2022-08-07 NOTE — Care Management Important Message (Signed)
Important Message  Patient Details  Name: [name] MRN: [id number] Date of Birth: Sep 19, 1946   Medicare Important Message Given:  Yes     Tommy Medal 08/07/2022, 12:10 PM

## 2022-08-07 NOTE — Plan of Care (Signed)
  Problem: Acute Rehab OT Goals (only OT should resolve) Goal: Pt. Will Perform Eating Flowsheets (Taken 08/07/2022 1153) Pt Will Perform Eating:  with modified independence  sitting Goal: Pt. Will Perform Grooming Flowsheets (Taken 08/07/2022 1153) Pt Will Perform Grooming:  sitting  with supervision Goal: Pt. Will Perform Lower Body Bathing Flowsheets (Taken 08/07/2022 1153) Pt Will Perform Lower Body Bathing:  with modified independence  sitting/lateral leans  with adaptive equipment Goal: Pt. Will Perform Upper Body Dressing Flowsheets (Taken 08/07/2022 1153) Pt Will Perform Upper Body Dressing:  with modified independence  sitting Goal: Pt. Will Perform Lower Body Dressing Flowsheets (Taken 08/07/2022 1153) Pt Will Perform Lower Body Dressing:  with modified independence  sitting/lateral leans  with adaptive equipment Goal: Pt. Will Transfer To Toilet Flowsheets (Taken 08/07/2022 1153) Pt Will Transfer to Toilet:  with min guard assist  stand pivot transfer  with min assist Goal: Pt/Caregiver Will Perform Home Exercise Program Flowsheets (Taken 08/07/2022 1153) Pt/caregiver will Perform Home Exercise Program:  Increased ROM  Increased strength  Both right and left upper extremity  With minimal assist  Nickie Warwick OT, MOT

## 2022-08-07 NOTE — Progress Notes (Signed)
Patient is complaining of nausea and states that nothing has been done to help him all night.  Zofran has been given twice by this nurse as scheduled with patient stating relief.   AC is preparing the phenergan drip.

## 2022-08-07 NOTE — Procedures (Signed)
   INITIAL HEMODIALYSIS TREATMENT NOTE:   First ever HD session completed 08/07/22.  RIJ TDC tolerated prescribed flow with stable pressures.  Kept even / no UF as per order.    Dilaudid 14m IVP given for c/o abdominal pain.  2 hour session completed. All blood was returned.  No changes from pre-HD assessment.  Hand-off given to TNigel Sloop LPN.  ARockwell Alexandria RN

## 2022-08-07 NOTE — Progress Notes (Signed)
Patient extremely restless and lethargic after returning from dialysis. Patient awaken for a few seconds them goes back to sleep. Patient reported earlier this morning that he had not slept all night. Patient given PRN pain medication during dialysis, see MAR. Patient refused heparin injection and Miralax, patient educated. MD Shahmehdi made aware.

## 2022-08-07 NOTE — Progress Notes (Signed)
Palliative: [name] is sitting up in the Green chair in his room.  He appears acutely/chronically ill and frail.  He is sleepy, and confused.  He tells me that he did not sleep well overnight due to nausea and pain.  I believe that he can make his basic needs known.  Bedside nursing staff is present assisting him in preparation for his first dialysis session.  I asked [name] where he is going.  He tells me that he is going for dialysis.  We talk about the plan for dialysis, the hope that this will improve his nausea and confusion.  Conference with attending, nephrology, bedside nursing staff, HD nurse, transition of care team related to patient condition, needs, goals of care, disposition.  Face-to-face meeting with transition of care team who states that they are working for short-term rehab and clip seat for HD.   Plan: At this point continue full scope/full code.  First HD session today, anticipate need for long-term HD.  Time for outcomes.  Anticipate need for short-term rehab  35 minutes  Quinn Axe, NP Palliative medicine team Team phone [phone] Greater than 50% of this time was spent counseling and coordinating care related to the above assessment and plan.

## 2022-08-07 NOTE — Progress Notes (Signed)
Progress Note   Patient: [name] QQI:[id number] DOB: 07/12/1946 DOA: 07/28/2022     9 DOS: the patient was seen and examined on 08/07/2022     Subjective:  Patient was seen and examined this morning, stable, complaining of abdominal pain. Was complaining of abdominal pain overnight Apparently he has not had any bowel movement in past few days    Brief hospital course: [name] is a 76 y.o. male with medical history significant of hypertension, hyperlipidemia, diastolic heart failure, CAD s/p stent, CKD stage IV, and obesity who presents with complaints of shortness of breath and chest pain starting 2 days ago.  Chest pain is substernal and feels like pressure. He chronically has orthopnea and has a bed that he always sleeps sitting up.  He has been feeling short of breath even when he is not doing anything which is not normal for him.  Denies having any fever, nausea, vomiting, diarrhea, abdominal pain, or diaphoresis.   Patient reports that he is followed by Dr. Harl Bowie of cardiology and at the end of June had been recommended to increase his Imdur to 60 mg twice daily.  He states that this initially helped decrease symptoms of chest pain.  Then he was told to start taking furosemide 40 mg twice daily by his nephrologist Dr. Theador Hawthorne on 6/12 and was started on torsemide 100 mg daily.  Since doing that patient reports that he has been swelling more.  He developed a blister on his left leg due to the swelling and he has been putting ointment on it.  Denies having any redness or erythema from the wound.   Upon admission into the emergency patient was seen to be afebrile with blood pressures 140/82 to 158/73 and all other vital signs maintained.  Labs significant for WBC 11.6, hemoglobin 8.2, potassium 3.2, BUN 74, creatinine 3.38, glucose 170, BNP 651, and high-sensitivity troponin 42. Chest x-ray showed cardiomegaly with vascular congestion.  Patient has been given Lasix 40 mg IV,  potassium chloride 40 mEq, and magnesium oxide 800 mg p.o.   07/29/2022: Pt decompensated on med surg unit and rapid response called and patient placed on bipap and transferred to stepdown ICU. Cardiology and nephrology consulted and patient started on maximal dose IV lasix by nephrology.  Pt noted to be in atrial fibrillation.  Family was called by MD and updated with change in status and treatment plan.     07/30/2022: Pt was intubated on 8/7 after failed bipap trial.  Afib converted to NSR.  He has diuresed >3L on high dose lasix therapy.     08/02/22--pt requested opioids.  After verifying with PCP pt has been receiving chronic norco, it was restarted.  Continues to have nausea--suspect partly uremia from renal failure.   Start zofran OTC.  Reglan IV x 1  08/03/2022-blood culture (8/10)-> MRSA, Urine culture (8/10)-> pseudomonas, start vanco iv pharmacy to dose, cefepime iv pharmacy to dose   08/05/2022 -patient current progressively getting worse, mildly confused, anasarca, nephrologist following recommending patient to start hemodialysis, vascular surgeon Dr. Donnetta Hutching was consulted -Palliative care consulted, discussed with niece at bedside -Blood cultures confirmed staph epididymis-MRSA-discontinuing IV vancomycin 8/14  08/06/2022 -underwent HD catheter placement-planning for hemodialysis in a.m. BUN 128, creatinine 4.88 blood pressure elevated, heart rate 79 Overnight noted for 2.6-second pause, first-degree AV block noted in normal sinus rhythm  08/07/2022 -status post HD catheter placement yesterday by Dr. Donnetta Hutching. Planning for hemodialysis today-discussed with [facility] regarding scheduling outpatient hemodialysis  Assessment  and Plan:  Acute respiratory failure with hypoxia  -Remains stable on room air now  -Currently satting 100% on 2 L of oxygen, continue have mild shortness of breath, anasarca - secondary to acute heart failure exacerbation and atrial fibrillation - Trial of bipap failed and  patient intubated by PCCM Dr. Halford Chessman.  - IV lasix maximal doses ordered per nephrology -with minimal improvement and worsening kidney function - pulmonary follow-up appreciated  - Nephrology following closely -recommending starting dialysis today 08/07/2022 - Vascular surgery Dr. Donnetta Hutching was consulted, who placed a hemodialysis catheter 08/06/2022 -Tolerated procedure well, planning for hemodialysis    - Extubated 8/9>>stable on 4L>>2L  Abdominal pain -KUB obtained, negative for any pathology with exception of stool burden -MiraLAX and magnesium citrate ordered -Patient has difficulty ambulating, therefore exacerbating constipation -As needed analgesic including Dilaudid ordered  Acute HFrEF - he was massively volume overloaded at presentation -Still volume overload with anasarca -IV lasix 120 mg q 12 ordered by nephrology>> minimal improvement, worsening kidney function and anasarca - further lasix dosing per nephrology - limited echo 8/23: LVEF 35-40%, LV global hypokinesis -Monitoring daily weights   - follow I/O  - cardiology consultation appreciated>>not cath candidate, continue to follow -Planning for fluid removal via hemodialysis today 08/07/2022   Acute on chronic CKD Stage IV CKD - appreciate nephrology consultation  - baseline creatinine 3.0-3.3 - he agrees to HD if he should needed it -- discussed with pt and brother 8/9 and 8/11 and 8/12 - 8/11 uptrend in serum creatinine to 4.46 - 8/12 uptrend in serum creatinine to 4.90 - 8/13 uptrend in serum creatinine to 5.1 - 8/14 uptrending serum creatinine to 5.02 - 8/15 BUN 128, creatinine 4.88-status post HD catheter placement -816 BUN 114, creatinine 4.00 Planning for hemodialysis on 08/07/2022   Fever/ UTI, Bacteremia -Improved, afebrile, normotensive -Tmax on this admission fevere 101.0 as of 8/10  -8/10 blood cultures x2>>GPC in 1 of 2 sets, staph epididymis -Blood culture 8/10 -> MRSA -1 out of 2 culture positive for  staph epididymis -Urine culture 8/10 -> Pseudomonas - Vanco 8/12-> discontinue 8/14 - Cefepime 8/12->    Hypertensive urgency -BP improving, stabilizing  resume home meds and IV hydralazine ordered - Continue added Norvasc, continue Coreg, clonidine, hydralazine, Imdur, c - soft BPs noted on precedex, agree with nephrology, changed BP meds to PRN to avoid prolonged low BP   Opioid Dependence/chronic back pain -PDMP does not show any recent opioid Rx in last 2 years -verified with PCP office pt is on norco 5/325 q 6 hrs prn>>restart - DC Norco 8/13, not controlling pain and concern for sedation - Start Tramadol 80m po q6h prn pain 8/13  Chest pain / longstanding angina / CAD - appreciate cardiology consult s/p DES to LAD and D1 in 10/2012 with nonobstructive disease along RCA and LCx. Most recent ischemic evaluation was a low-risk NST in 04/2022 - he is being treated medically with ASA 81 mg, coreg, pravastatin - not cath candidate due to CKD   Leukocytosis  - stress reaction likely vs UTI/ bacteremia -Trending down - follow, no s/s of    Hypokalemia resolved - repleted previously - mag 2.1   Essential hypertension  - resumed home carvedilol, isosorbide, hydralazine, clonidine, Norvasc  Diabetes mellitus, type 2, controlled with renal complications - continue close CBG monitoring and SSI coverage    Paroxysmal Atrial Fibrillation  - new diagnosis this admission -  His CHA2DS2-VASc Score is at least 6. Has not yet been started  on anticoagulation, likely due to his chronic anemia  -Unless noted to have definitive atrial fibrillation, would prefer not to commit to long-term anticoagulation.  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.88 - lifestyle modification  Constipation -colace 1090mpo qday x 3 days start 8/13     Ethics: Discussed with patient and his brothers and family at bedside 08/06/2022 Palliative care consult, patient remains full code  And lieu of multiple comorbidities including acute renal failure, heart failure we are recommending DNR status, pursuing with palliative care-expecting steep decline     Family Communication:   brother updated at bedside 8/15  Consultants:  renal, pulm, cardiology  Code Status:  FULL    DVT Prophylaxis:  [place] Heparin      Procedures: As Listed in Progress Note Above   Antibiotics: Vanco 8/12-> Cefepime 8/12->           Physical Exam: Blood pressure (!) 192/78, pulse 71, temperature 98.6 F (37 C), resp. rate 16, height 6' 2"  (1.88 m), weight 110.6 kg, SpO2 98 %.  General:  AAO x 2,  cooperative, no distress; complaining abdominal pain  HEENT:  Normocephalic, PERRL, otherwise with in Normal limits   Neuro:  CNII-XII intact. , normal motor and sensation, reflexes intact   Lungs:   Clear to auscultation BL, Respirations unlabored,  No wheezes / crackles  Cardio:    S1/S2, RRR, No murmure, No Rubs or Gallops   Abdomen:  Soft, diffuse mild tenderness, bowel sounds active all four quadrants, no guarding or peritoneal signs.  Muscular  skeletal:  Limited exam -severe global generalized weaknesses - in bed, able to move all 4 extremities,   2+ pulses,  symmetric, No pitting edema  Skin:  Dry, warm to touch, negative for any Rashes,  Wounds: Please see nursing documentation         Data Reviewed:    Latest Ref Rng & Units 08/07/2022    5:15  AM 08/06/2022    3:15 PM 08/06/2022    4:30 AM  CBC  WBC 4.0 - 10.5 K/uL 9.9  9.6  12.0   Hemoglobin 13.0 - 17.0 g/dL 8.4  8.6  8.5   Hematocrit 39.0 - 52.0 % 27.3  28.2  27.2   Platelets 150 - 400 K/uL 357  317  362       Latest Ref Rng & Units 08/07/2022    5:15 AM 08/06/2022    3:15 PM 08/06/2022    4:30 AM  BMP  Glucose 70 - 99 mg/dL 201  357  174   BUN 8 - 23 mg/dL 114  149  128   Creatinine 0.61 - 1.24 mg/dL 4.00  4.52  4.88   Sodium 135 - 145 mmol/L 149  147  150   Potassium 3.5 - 5.1 mmol/L 4.2  4.5  4.7   Chloride 98 - 111 mmol/L  112  109  110   CO2 22 - 32 mmol/L 27  21  28    Calcium 8.9 - 10.3 mg/dL 9.8  9.7  10.1      Time spent: 45 minutes  Author: Deatra James, MD 08/07/2022 11:39 AM  For on call review [email or web address].

## 2022-08-07 NOTE — Inpatient Diabetes Management (Signed)
Inpatient Diabetes Program Recommendations  AACE/ADA: New Consensus Statement on Inpatient Glycemic Control   Target Ranges:  Prepandial:   less than 140 mg/dL      Peak postprandial:   less than 180 mg/dL (1-2 hours)      Critically ill patients:  140 - 180 mg/dL    Latest Reference Range & Units 08/06/22 07:20 08/06/22 11:01 08/06/22 16:12 08/06/22 21:00 08/07/22 07:22  Glucose-Capillary 70 - 99 mg/dL 166 (H) 220 (H) 316 (H) 275 (H) 171 (H)   Review of Glycemic Control  Diabetes history: DM2 Outpatient Diabetes medications: NPH 20 units daily if needed, Januvia 25 mg daily Current orders for Inpatient glycemic control: Novolog 0-9 units TID with meals, Novolog 0-5 units QHS; Prednsione 20 mg QAM   Inpatient Diabetes Program Recommendations:     Insulin: If steroids are continued, please consider ordering Novolog 4 units TID with meals for meal coverage if patient eats at least 50% of meals.   Thanks, Barnie Alderman, RN, MSN, [place] Diabetes Coordinator Inpatient Diabetes Program [phone] (Team Pager from 8am to [place])

## 2022-08-07 NOTE — Evaluation (Signed)
Occupational Therapy Evaluation Patient Details Name: [name] MRN: [id number] DOB: [birth year] Today's Date: 08/07/2022   History of Present Illness [name] is a 76 y.o. male with medical history significant of hypertension, hyperlipidemia, diastolic heart failure, CAD s/p stent, CKD stage IV, and obesity who presents with complaints of shortness of breath and chest pain starting 2 days ago.  Chest pain is substernal and feels like pressure. He chronically has orthopnea and has a bed that he always sleeps sitting up.  He has been feeling short of breath even when he is not doing anything which is not normal for him.  Denies having any fever, nausea, vomiting, diarrhea, abdominal pain, or diaphoresis.   Clinical Impression   Pt agreeable to OT and PT co-evaluation. Pt lethargic with nursing reported that he had been given pain medication and did not sleep well last night. Arousal level improved some once seated at EOB. Pt demonstrates significant B UE weakness with limited A/ROM against gravity. Mod to max A needed to boost from EOB for sit to stand with more moderate assist with poor balance to transfer to chair. Pt able to take a couple steps forward and backwards but with much verbal cuing.  Pt requires mod to max A for most ADL's per clinical judgement based on pt's sitting balance and limited B UE strength. Pt will benefit from continued OT in the hospital and recommended venue below to increase strength, balance, and endurance for safe ADL's.        Recommendations for follow up therapy are one component of a multi-disciplinary discharge planning process, led by the attending physician.  Recommendations may be updated based on patient status, additional functional criteria and insurance authorization.   Follow Up Recommendations  Skilled nursing-short term rehab (<3 hours/day)    Assistance Recommended at Discharge Intermittent Supervision/Assistance  Patient can return home  with the following A lot of help with walking and/or transfers;A lot of help with bathing/dressing/bathroom;Assistance with cooking/housework;Assistance with feeding;Assist for transportation;Help with stairs or ramp for entrance    Functional Status Assessment  Patient has had a recent decline in their functional status and demonstrates the ability to make significant improvements in function in a reasonable and predictable amount of time.  Equipment Recommendations  None recommended by OT    Recommendations for Other Services       Precautions / Restrictions Precautions Precautions: Fall Restrictions Weight Bearing Restrictions: No      Mobility Bed Mobility Overal bed mobility: Needs Assistance Bed Mobility: Supine to Sit     Supine to sit: Max assist     General bed mobility comments: Assisted by PT to to assit pt in pulling up to sit with this therapist pushing pt up as well.    Transfers Overall transfer level: Needs assistance Equipment used: Rolling walker (2 wheels) Transfers: Sit to/from Stand, Bed to chair/wheelchair/BSC Sit to Stand: Mod assist, Max assist     Step pivot transfers: Mod assist     General transfer comment: Slow labored movement. Needs assist to boost from slightly elevated bed, but once standing pt demonstrates need for mroe moderate assist with extended time and cuing.      Balance Overall balance assessment: Needs assistance Sitting-balance support: Feet supported, No upper extremity supported Sitting balance-Leahy Scale: Fair Sitting balance - Comments: feair seated at EOB; posterior lean at first Postural control: Posterior lean Standing balance support: During functional activity, Reliant on assistive device for balance, Bilateral upper extremity supported Standing balance-Leahy  Scale: Poor                             ADL either performed or assessed with clinical judgement   ADL Overall ADL's : Needs  assistance/impaired Eating/Feeding: Moderate assistance;Sitting   Grooming: Moderate assistance;Sitting       Lower Body Bathing: Maximal assistance;Sitting/lateral leans;Bed level   Upper Body Dressing : Minimal assistance;Moderate assistance;Sitting   Lower Body Dressing: Maximal assistance;Sitting/lateral leans;Bed level   Toilet Transfer: Moderate assistance;Maximal assistance;Stand-pivot;Rolling walker (2 wheels) Toilet Transfer Details (indicate cue type and reason): simulated via EOB to chair transfer with RW Toileting- Clothing Manipulation and Hygiene: Maximal assistance;Total assistance;Sitting/lateral lean;Bed level       Functional mobility during ADLs: Moderate assistance;Maximal assistance;Rolling walker (2 wheels)       Vision Baseline Vision/History: 1 Wears glasses Ability to See in Adequate Light: 0 Adequate Patient Visual Report:  (Pt not reporting any change from baseline.) Vision Assessment?: No apparent visual deficits     Perception     Praxis      Pertinent Vitals/Pain Pain Assessment Pain Assessment: Faces Faces Pain Scale: Hurts little more Pain Location: stomach Pain Descriptors / Indicators:  (not reported) Pain Intervention(s): Limited activity within patient's tolerance, Monitored during session, Repositioned, Premedicated before session     Hand Dominance Right   Extremity/Trunk Assessment Upper Extremity Assessment Upper Extremity Assessment: LUE deficits/detail;RUE deficits/detail RUE Deficits / Details: 50 to 75% of available P/ROm for shoudler flexion. Partial active flexion of R UE. Very weak but able to use functionally with RW during transfer. LUE Deficits / Details: L P/ROM within 75 to 100% of available range for shoulder flexion. able to lift L UE to RW. Noted to be missing thumb. Limited grip on RW likely for this reason.   Lower Extremity Assessment Lower Extremity Assessment: Defer to PT evaluation   Cervical / Trunk  Assessment Cervical / Trunk Assessment: Normal   Communication Communication Communication: No difficulties   Cognition Arousal/Alertness: Lethargic Behavior During Therapy: WFL for tasks assessed/performed Overall Cognitive Status: Within Functional Limits for tasks assessed                                 General Comments: Pt lethargic but woke up more once seated at [place] expects to be discharged to:: Private residence Living Arrangements: Alone Available Help at Discharge: Family;Available PRN/intermittently Type of Home: House Home Access: Stairs to enter [employer] of Steps: 3 Entrance Stairs-Rails: Right;Left;Can reach both Home Layout: One level     Bathroom Shower/Tub: Occupational psychologist: Standard Bathroom Accessibility: Yes   Home Equipment: Conservation officer, nature (2 wheels);Cane - single point;Grab bars - tub/shower;BSC/3in1   Additional Comments: taken via PT note      Prior Functioning/Environment Prior Level of Function : Independent/Modified Independent;Driving             Mobility Comments: Using the cane for everything ADLs Comments: increase time and effort, but ind with IADLs/ADLs (taken via PT note)        OT Problem List: Decreased strength;Decreased activity tolerance;Decreased range of motion;Impaired balance (sitting and/or standing);Impaired UE functional use      OT Treatment/Interventions: Self-care/ADL training;Therapeutic exercise;Patient/family education;Balance training;Therapeutic activities    OT Goals(Current goals  can be found in the care plan section) Acute Rehab OT Goals Patient Stated Goal: Get stronger at rehab. OT Goal Formulation: With patient Time For Goal Achievement: 08/21/22 Potential to Achieve Goals: Fair  OT Frequency: Min 2X/week    Co-evaluation PT/OT/SLP Co-Evaluation/Treatment: Yes Reason for Co-Treatment: To address  functional/ADL transfers   OT goals addressed during session: ADL's and self-care;Strengthening/ROM                       End of Session Equipment Utilized During Treatment: Rolling walker (2 wheels);Gait belt  Activity Tolerance: Patient tolerated treatment well Patient left: in chair;with call bell/phone within reach  OT Visit Diagnosis: Unsteadiness on feet (R26.81);Other abnormalities of gait and mobility (R26.89);Muscle weakness (generalized) (M62.81)                Time: 5003-7048 OT Time Calculation (min): 28 min Charges:  OT General Charges $OT Visit: 1 Visit OT Evaluation $OT Eval Low Complexity: 1 Low  Afsa Meany OT, MOT  Larey Seat 08/07/2022, 11:50 AM

## 2022-08-07 NOTE — Progress Notes (Signed)
Subjective:  2 liters of UOP-  BUN climbing and he is c/o nausea, is yelling out -   plan is for first HD today.  Sodium is better with iv d5w   Objective Vital signs in last 24 hours: Vitals:   08/06/22 1334 08/06/22 2056 08/06/22 2144 08/07/22 0608  BP: (!) 158/63 (!) 141/58 (!) 145/52 (!) 172/64  Pulse: 73 73 67 71  Resp: 17 20 16 16   Temp:  98.2 F (36.8 C) 97.9 F (36.6 C) 98.6 F (37 C)  TempSrc:      SpO2: 95% 100% 98% 98%  Weight:      Height:       Weight change:   Intake/Output Summary (Last 24 hours) at 08/07/2022 0735 Last data filed at 08/07/2022 7341 Gross per 24 hour  Intake 1862.52 ml  Output 2000 ml  Net -137.48 ml    Assessment/Plan: 76 year old BM with DM, HTN, diastolic heart failure,  CAD and CKD-  presenting with SOB and CP   1.AKI- advanced CKD at baseline.  He then had volume overload contributing to respiratory failure.  - There has been no acute indication for dialysis-  he was diuresing well with meds- said he would want if it were to become needed. He definitely is not thriving and I am sure the BUN over 100 is not helping-  I doubt that his numbers will get that much better.  I would favor starting HD for clearance-  he seems agreeable-  I have talked to family as well.  Now s/p [facility]-  for first HD today-  will need placement to accomodate HD as I feel this will be permanent-  ESRD-  appreciate SW-  family leaning toward [facility] and [place] for HD needs when ready for discharge.  Timing of second HD is unclear-  either Thurs or Friday based on inpatient ESRD volume.  Hoping that getting the BUN down will help with his stomach complaints   1a. CKD stage IV - baseline Cr is in the mid 3's recently   2. Hypertension/volume  - Volume status improved. Now with high BUN and high sodium -  off diuretic and on D5w via IV as is not alert enough go encourage drinking, also nauseated.   Is on hydralazine 100 tid/coreg 6.25 BID and clonidine 0.1  BID   3. Anemia  - has been getting iron and ESA as outpatient.  S/p IV iron and on aranesp 200 mcg every Tuesday -  hgb stable in the 8's   4. Hypokalemia-  resolved   5. Acute systolic heart failure  cardiology following.  Medical management recommended at this time per charting. Optimize volume status as above   6. Acute hypoxic resp failure - extubated. Optimize volume status above. Good sats on room air now   7. Will need to check PTH at some point-  phos is Celine Ahr    Labs: Basic Metabolic Panel: Recent Labs  Lab 08/05/22 0507 08/06/22 0430 08/06/22 1515  NA 147* 150* 147*  K 5.0 4.7 4.5  CL 107 110 109  CO2 28 28 21*  GLUCOSE 198* 174* 357*  BUN 132* 128* 149*  CREATININE 5.02* 4.88* 4.52*  CALCIUM 9.5 10.1 9.7  PHOS 4.9* 4.7* 3.7   Liver Function Tests: Recent Labs  Lab 08/04/22 0528 08/05/22 0507 08/06/22 0430 08/06/22 1515  AST 14*  --   --   --   ALT 12  --   --   --  ALKPHOS 54  --   --   --   BILITOT 0.6  --   --   --   PROT 6.9  --   --   --   ALBUMIN 2.7* 2.7* 2.9* 3.1*   No results for input(s): "LIPASE", "AMYLASE" in the last 168 hours. No results for input(s): "AMMONIA" in the last 168 hours. CBC: Recent Labs  Lab 08/04/22 0528 08/04/22 1925 08/05/22 0509 08/06/22 0430 08/06/22 1515 08/07/22 0515  WBC 11.2*  --  9.9 12.0* 9.6 9.9  HGB 7.3*   < > 8.0* 8.5* 8.6* 8.4*  HCT 23.2*   < > 25.8* 27.2* 28.2* 27.3*  MCV 101.8*  --  100.0 100.7* 102.9* 100.4*  PLT 257  --  286 362 317 357   < > = values in this interval not displayed.   Cardiac Enzymes: No results for input(s): "CKTOTAL", "CKMB", "CKMBINDEX", "TROPONINI" in the last 168 hours. CBG: Recent Labs  Lab 08/05/22 1959 08/06/22 0720 08/06/22 1101 08/06/22 1612 08/06/22 2100  GLUCAP 241* 166* 220* 316* 275*    Iron Studies: No results for input(s): "IRON", "TIBC", "TRANSFERRIN", "FERRITIN" in the last 72 hours. Studies/Results: DG Chest Portable 1  View  Result Date: 08/06/2022 CLINICAL DATA:  Line placement, dialysis catheter EXAM: PORTABLE CHEST 1 VIEW COMPARISON:  07/31/2022 FINDINGS: Cardiomegaly. Large-bore right neck multi lumen vascular catheter, tip positioned near the superior cavoatrial junction. Both lungs are clear. The visualized skeletal structures are unremarkable. IMPRESSION: 1. Cardiomegaly without acute abnormality of the lungs in AP portable projection. 2. Large-bore right neck multi lumen vascular catheter, tip positioned near the superior cavoatrial junction. Electronically Signed   By: Delanna Ahmadi M.D.   On: 08/06/2022 08:38   DG C-Arm 1-60 Min-No Report  Result Date: 08/06/2022 Fluoroscopy was utilized by the requesting physician.  No radiographic interpretation.   Medications: Infusions:  sodium chloride     ceFEPime (MAXIPIME) IV 1 g (08/07/22 0221)   dextrose 50 mL/hr at 08/07/22 0221   promethazine (PHENERGAN) injection (IM or IVPB) 12.5 mg (08/07/22 0552)    Scheduled Medications:  acetaminophen  1,000 mg Oral Q6H   acetaminophen  650 mg Oral Once   allopurinol  100 mg Oral Daily   aspirin  81 mg Oral Daily   atorvastatin  80 mg Oral Daily   carvedilol  6.25 mg Oral BID WC   Chlorhexidine Gluconate Cloth  6 each Topical Daily   Chlorhexidine Gluconate Cloth  6 each Topical Q0600   cloNIDine  0.1 mg Oral BID   darbepoetin (ARANESP) injection - NON-DIALYSIS  200 mcg Subcutaneous Q Tue-1800   feeding supplement  237 mL Oral BID BM   heparin  5,000 Units Subcutaneous Q8H   hydrALAZINE  100 mg Oral TID   insulin aspart  0-5 Units Subcutaneous QHS   insulin aspart  0-9 Units Subcutaneous TID WC   isosorbide mononitrate  120 mg Oral Daily   ondansetron (ZOFRAN) IV  4 mg Intravenous Q6H   pantoprazole  40 mg Oral BID   predniSONE  20 mg Oral Q breakfast   sodium chloride flush  3 mL Intravenous Q12H    have reviewed scheduled and prn medications.  Physical Exam: General: very weak-  difficulty  raising arms-  slow conversation  Heart: RRR Lungs: mostly clear-  poor effort Abdomen: obese, soft, non tender Extremities: non pitting edema  Dialysis Access: new right sided TDC    08/07/2022,7:35 AM  LOS: 9 days

## 2022-08-07 NOTE — Progress Notes (Signed)
Physical Therapy Treatment Patient Details Name: [name] MRN: [id number] DOB: [birth year] Today's Date: 08/07/2022   History of Present Illness [name] is a 76 y.o. male with medical history significant of hypertension, hyperlipidemia, diastolic heart failure, CAD s/p stent, CKD stage IV, and obesity who presents with complaints of shortness of breath and chest pain starting 2 days ago.  Chest pain is substernal and feels like pressure. He chronically has orthopnea and has a bed that he always sleeps sitting up.  He has been feeling short of breath even when he is not doing anything which is not normal for him.  Denies having any fever, nausea, vomiting, diarrhea, abdominal pain, or diaphoresis.    PT Comments    Patient presents slightly lethargic possible due to medication and required repeated verbal/tactile cueing to participate with therapy.  Patient demonstrates slow labored movement for sitting up at bedside requiring frequent rest breaks, very unsteady on feet and limited to a few steps at bedside before having to sit due BLE weakness.  Patient tolerated sitting up in chair after therapy.  Patient will benefit from continued skilled physical therapy in hospital and recommended venue below to increase strength, balance, endurance for safe ADLs and gait.      Recommendations for follow up therapy are one component of a multi-disciplinary discharge planning process, led by the attending physician.  Recommendations may be updated based on patient status, additional functional criteria and insurance authorization.  Follow Up Recommendations  Skilled nursing-short term rehab (<3 hours/day) Can patient physically be transported by private vehicle: No   Assistance Recommended at Discharge Intermittent Supervision/Assistance  Patient can return home with the following A lot of help with walking and/or transfers;A lot of help with bathing/dressing/bathroom;Help with stairs or ramp  for entrance;Assistance with cooking/housework   Equipment Recommendations  None recommended by PT    Recommendations for Other Services       Precautions / Restrictions Precautions Precautions: Fall Restrictions Weight Bearing Restrictions: No     Mobility  Bed Mobility Overal bed mobility: Needs Assistance Bed Mobility: Supine to Sit     Supine to sit: Max assist Sit to supine: Max assist   General bed mobility comments: increased time, labored movement    Transfers Overall transfer level: Needs assistance Equipment used: Rolling walker (2 wheels) Transfers: Sit to/from Stand, Bed to chair/wheelchair/BSC Sit to Stand: Mod assist, Max assist   Step pivot transfers: Mod assist       General transfer comment: as per OT notes    Ambulation/Gait Ambulation/Gait assistance: Mod assist Gait Distance (Feet): 8 Feet Assistive device: Rolling walker (2 wheels) Gait Pattern/deviations: Decreased step length - right, Decreased step length - left, Decreased stride length Gait velocity: slow     General Gait Details: limited to a few slow labored side steps, steps forward/backwards at bedside before having to sit due to fatigue and and legs giving/weakness   Stairs             Wheelchair Mobility    Modified Rankin (Stroke Patients Only)       Balance Overall balance assessment: Needs assistance Sitting-balance support: Feet supported, No upper extremity supported Sitting balance-Leahy Scale: Fair Sitting balance - Comments: seated at EOB   Standing balance support: During functional activity, Bilateral upper extremity supported, Reliant on assistive device for balance Standing balance-Leahy Scale: Poor Standing balance comment: fair/poor using RW  Cognition Arousal/Alertness: Awake/alert Behavior During Therapy: WFL for tasks assessed/performed Overall Cognitive Status: Within Functional Limits for tasks  assessed                                          Exercises      General Comments        Pertinent Vitals/Pain Pain Assessment Pain Assessment: Faces Faces Pain Scale: Hurts little more Pain Location: stomach Pain Descriptors / Indicators: Aching Pain Intervention(s): Limited activity within patient's tolerance, Monitored during session, Repositioned    Home Living Family/patient expects to be discharged to:: Private residence Living Arrangements: Alone Available Help at Discharge: Family;Available PRN/intermittently Type of Home: House Home Access: Stairs to enter Entrance Stairs-Rails: Right;Left;Can reach both Entrance Stairs-Number of Steps: 3   Home Layout: One level Home Equipment: Conservation officer, nature (2 wheels);Cane - single point;Grab bars - tub/shower;BSC/3in1 Additional Comments: taken via PT note    Prior Function            PT Goals (current goals can now be found in the care plan section) Acute Rehab PT Goals Patient Stated Goal: Go to rehab and go home PT Goal Formulation: With patient Time For Goal Achievement: 08/15/22 Potential to Achieve Goals: Good Progress towards PT goals: Progressing toward goals    Frequency    Min 3X/week      PT Plan Current plan remains appropriate    Co-evaluation PT/OT/SLP Co-Evaluation/Treatment: Yes Reason for Co-Treatment: To address functional/ADL transfers PT goals addressed during session: Mobility/safety with mobility;Balance;Proper use of DME OT goals addressed during session: ADL's and self-care;Strengthening/ROM      AM-PAC PT "6 Clicks" Mobility   Outcome Measure  Help needed turning from your back to your side while in a flat bed without using bedrails?: A Lot Help needed moving from lying on your back to sitting on the side of a flat bed without using bedrails?: A Lot Help needed moving to and from a bed to a chair (including a wheelchair)?: A Lot Help needed standing up from a  chair using your arms (e.g., wheelchair or bedside chair)?: A Lot Help needed to walk in hospital room?: A Lot Help needed climbing 3-5 steps with a railing? : Total 6 Click Score: 11    End of Session   Activity Tolerance: Patient tolerated treatment well;Patient limited by fatigue Patient left: in chair;with call bell/phone within reach Nurse Communication: Mobility status PT Visit Diagnosis: Unsteadiness on feet (R26.81);Other abnormalities of gait and mobility (R26.89);Muscle weakness (generalized) (M62.81)     Time: 7867-6720 PT Time Calculation (min) (ACUTE ONLY): 24 min  Charges:  $Therapeutic Activity: 23-37 mins                     2:14 PM, 08/07/22 [name], MPT Physical Therapist with [facility] [phone] office [phone] mobile phone

## 2022-08-08 ENCOUNTER — Ambulatory Visit: Payer: Medicare Other | Admitting: Cardiology

## 2022-08-08 DIAGNOSIS — N186 End stage renal disease: Secondary | ICD-10-CM

## 2022-08-08 DIAGNOSIS — R1030 Lower abdominal pain, unspecified: Secondary | ICD-10-CM

## 2022-08-08 DIAGNOSIS — Z515 Encounter for palliative care: Secondary | ICD-10-CM | POA: Diagnosis not present

## 2022-08-08 DIAGNOSIS — N184 Chronic kidney disease, stage 4 (severe): Secondary | ICD-10-CM | POA: Diagnosis not present

## 2022-08-08 DIAGNOSIS — J9601 Acute respiratory failure with hypoxia: Secondary | ICD-10-CM | POA: Diagnosis not present

## 2022-08-08 DIAGNOSIS — I5033 Acute on chronic diastolic (congestive) heart failure: Secondary | ICD-10-CM | POA: Diagnosis not present

## 2022-08-08 DIAGNOSIS — D649 Anemia, unspecified: Secondary | ICD-10-CM | POA: Diagnosis not present

## 2022-08-08 LAB — BASIC METABOLIC PANEL
Anion gap: 10 (ref 5–15)
BUN: 96 mg/dL — ABNORMAL HIGH (ref 8–23)
CO2: 30 mmol/L (ref 22–32)
Calcium: 9.6 mg/dL (ref 8.9–10.3)
Chloride: 107 mmol/L (ref 98–111)
Creatinine, Ser: 3.05 mg/dL — ABNORMAL HIGH (ref 0.61–1.24)
GFR, Estimated: 20 mL/min — ABNORMAL LOW (ref >60)
Glucose, Bld: 158 mg/dL — ABNORMAL HIGH (ref 70–99)
Potassium: 4.5 mmol/L (ref 3.5–5.1)
Sodium: 147 mmol/L — ABNORMAL HIGH (ref 135–145)

## 2022-08-08 LAB — RENAL FUNCTION PANEL
Albumin: 2.9 g/dL — ABNORMAL LOW (ref 3.5–5.0)
Anion gap: 10 (ref 5–15)
BUN: 96 mg/dL — ABNORMAL HIGH (ref 8–23)
CO2: 29 mmol/L (ref 22–32)
Calcium: 9.4 mg/dL (ref 8.9–10.3)
Chloride: 107 mmol/L (ref 98–111)
Creatinine, Ser: 3.11 mg/dL — ABNORMAL HIGH (ref 0.61–1.24)
GFR, Estimated: 20 mL/min — ABNORMAL LOW (ref >60)
Glucose, Bld: 157 mg/dL — ABNORMAL HIGH (ref 70–99)
Phosphorus: 3.7 mg/dL (ref 2.5–4.6)
Potassium: 4.5 mmol/L (ref 3.5–5.1)
Sodium: 146 mmol/L — ABNORMAL HIGH (ref 135–145)

## 2022-08-08 LAB — CBC
HCT: 26.8 % — ABNORMAL LOW (ref 39.0–52.0)
Hemoglobin: 8.3 g/dL — ABNORMAL LOW (ref 13.0–17.0)
MCH: 31 pg (ref 26.0–34.0)
MCHC: 31 g/dL (ref 30.0–36.0)
MCV: 100 fL (ref 80.0–100.0)
Platelets: 355 10*3/uL (ref 150–400)
RBC: 2.68 MIL/uL — ABNORMAL LOW (ref 4.22–5.81)
RDW: 16.3 % — ABNORMAL HIGH (ref 11.5–15.5)
WBC: 9.8 10*3/uL (ref 4.0–10.5)
nRBC: 0.2 % (ref 0.0–0.2)

## 2022-08-08 LAB — GLUCOSE, CAPILLARY
Glucose-Capillary: 158 mg/dL — ABNORMAL HIGH (ref 70–99)
Glucose-Capillary: 226 mg/dL — ABNORMAL HIGH (ref 70–99)
Glucose-Capillary: 246 mg/dL — ABNORMAL HIGH (ref 70–99)

## 2022-08-08 LAB — BRAIN NATRIURETIC PEPTIDE: B Natriuretic Peptide: 139 pg/mL — ABNORMAL HIGH (ref 0.0–100.0)

## 2022-08-08 MED ORDER — SENNA 8.6 MG PO TABS
2.0000 | ORAL_TABLET | Freq: Every day | ORAL | Status: DC
  Administered 2022-08-08: 17.2 mg via ORAL
  Filled 2022-08-08: qty 2

## 2022-08-08 MED ORDER — CHLORHEXIDINE GLUCONATE CLOTH 2 % EX PADS
6.0000 | MEDICATED_PAD | Freq: Every day | CUTANEOUS | Status: DC
  Administered 2022-08-09 – 2022-08-10 (×2): 6 via TOPICAL

## 2022-08-08 MED ORDER — MAGNESIUM HYDROXIDE 400 MG/5ML PO SUSP
960.0000 mL | Freq: Once | ORAL | Status: AC
  Administered 2022-08-08: 960 mL via RECTAL
  Filled 2022-08-08: qty 473

## 2022-08-08 NOTE — Progress Notes (Signed)
Progress Note   Patient: [name] GYK:[id number] DOB: 22-Jan-1946 DOA: 07/28/2022     10 DOS: the patient was seen and examined on 08/08/2022     Subjective:  Patient was seen and examined this morning, stable no acute distress, somnolent,-lethargic Overnight refused MiraLAX, enema, still has not had a bowel movements, Consistent with stool burden; therefore explaining his abdominal discomfort and pain   Brief hospital course: [name] is a 76 y.o. male with medical history significant of hypertension, hyperlipidemia, diastolic heart failure, CAD s/p stent, CKD stage IV, and obesity who presents with complaints of shortness of breath and chest pain starting 2 days ago.  Chest pain is substernal and feels like pressure. He chronically has orthopnea and has a bed that he always sleeps sitting up.  He has been feeling short of breath even when he is not doing anything which is not normal for him.  Denies having any fever, nausea, vomiting, diarrhea, abdominal pain, or diaphoresis.   Patient reports that he is followed by Dr. Harl Bowie of cardiology and at the end of June had been recommended to increase his Imdur to 60 mg twice daily.  He states that this initially helped decrease symptoms of chest pain.  Then he was told to start taking furosemide 40 mg twice daily by his nephrologist Dr. Theador Hawthorne on 6/12 and was started on torsemide 100 mg daily.  Since doing that patient reports that he has been swelling more.  He developed a blister on his left leg due to the swelling and he has been putting ointment on it.  Denies having any redness or erythema from the wound.   Upon admission into the emergency patient was seen to be afebrile with blood pressures 140/82 to 158/73 and all other vital signs maintained.  Labs significant for WBC 11.6, hemoglobin 8.2, potassium 3.2, BUN 74, creatinine 3.38, glucose 170, BNP 651, and high-sensitivity troponin 42. Chest x-ray showed cardiomegaly with vascular  congestion.  Patient has been given Lasix 40 mg IV, potassium chloride 40 mEq, and magnesium oxide 800 mg p.o.   07/29/2022: Pt decompensated on med surg unit and rapid response called and patient placed on bipap and transferred to stepdown ICU. Cardiology and nephrology consulted and patient started on maximal dose IV lasix by nephrology.  Pt noted to be in atrial fibrillation.  Family was called by MD and updated with change in status and treatment plan.     07/30/2022: Pt was intubated on 8/7 after failed bipap trial.  Afib converted to NSR.  He has diuresed >3L on high dose lasix therapy.     08/02/22--pt requested opioids.  After verifying with PCP pt has been receiving chronic norco, it was restarted.  Continues to have nausea--suspect partly uremia from renal failure.   Start zofran OTC.  Reglan IV x 1  08/03/2022-blood culture (8/10)-> MRSA, Urine culture (8/10)-> pseudomonas, start vanco iv pharmacy to dose, cefepime iv pharmacy to dose   08/05/2022 -patient current progressively getting worse, mildly confused, anasarca, nephrologist following recommending patient to start hemodialysis, vascular surgeon Dr. Donnetta Hutching was consulted -Palliative care consulted, discussed with niece at bedside -Blood cultures confirmed staph epididymis-MRSA-discontinuing IV vancomycin 8/14  08/06/2022 -underwent HD catheter placement-planning for hemodialysis in a.m. BUN 128, creatinine 4.88 blood pressure elevated, heart rate 79 Overnight noted for 2.6-second pause, first-degree AV block noted in normal sinus rhythm  08/07/2022 -status post HD catheter placement yesterday by Dr. Donnetta Hutching. Planning for hemodialysis today-discussed with [facility] regarding scheduling outpatient  hemodialysis  08/08/2022-status post first hemodialysis yesterday 08/07/2022-tolerated dialysis well, More awake but lethargic today, still complaining abdominal discomfort--- no bowel movement, KUB consistent with stillborns  Assessment and  Plan:  Acute respiratory failure with hypoxia  -Much improved from respiratory status, on room air, satting 100%  -Mild shortness of breath, anasarca has improved - secondary to acute heart failure exacerbation and atrial fibrillation - Trial of bipap failed and patient intubated by PCCM Dr. Halford Chessman.  - IV lasix maximal doses ordered per nephrology -with minimal improvement  -Worsening kidney function, now initiated hemodialysis - pulmonary follow-up appreciated  - Nephrology following closely -recommending starting dialysis today 08/07/2022 - Vascular surgery Dr. Donnetta Hutching was consulted, who placed a hemodialysis catheter 08/06/2022 -Tolerated procedure well, planning for hemodialysis    - Extubated 8/9>>stable on 4L>>2L  Abdominal pain -KUB obtained, negative for any pathology with exception of stool burden -MiraLAX and magnesium citrate ordered --- patient refused  Patient nursing staff was reeducated, enema has been ordered  -Patient has difficulty ambulating, therefore exacerbating constipation -As needed analgesic including Dilaudid ordered-which likely makes stool burden worse without stool softener laxatives  Acute HFrEF -Status post hemodialysis, still seems to be volume overloaded -First hemodialysis 08/08/2022, planning for another hemodialysis today  -Still volume overload with anasarca -IV lasix 120 mg q 12 ordered by nephrology>> minimal improvement,  - further lasix dosing per nephrology - limited echo 8/23: LVEF 35-40%, LV global hypokinesis -Monitoring daily weights   - follow I/O  - cardiology consultation appreciated>>not cath candidate, continue to follow -Planning for fluid removal via hemodialysis today 08/07/2022   Acute on chronic CKD Stage IV CKD >>> end-stage renal disease on hemodialysis now -Nephrology following, status post HD catheter placement on 08/06/2022 - baseline creatinine 3.0-3.3 - he agrees to HD if he should needed it -- discussed with pt and  brother 8/9 and 8/11 and 8/12 - 8/11 uptrend in serum creatinine to 4.46 - 8/12 uptrend in serum creatinine to 4.90 - 8/13 uptrend in serum creatinine to 5.1 - 8/14 uptrending serum creatinine to 5.02 - 8/15 BUN 128, creatinine 4.88-status post HD catheter placement -816 BUN 114, creatinine 4.00 -First Hemodialysis on 08/07/2022 -Planning for hemodialysis today   Fever/ UTI, Bacteremia -Improved, afebrile, normotensive -Tmax on this admission fevere 101.0 as of 8/10  -8/10 blood cultures x2>>GPC in 1 of 2 sets, staph epididymis -Blood culture 8/10 -> MRSA -1 out of 2 culture positive for staph epididymis -Urine culture 8/10 -> Pseudomonas - Vanco 8/12-> discontinue 8/14 - Cefepime 8/12->    Hypertensive urgency -BP improving, stabilizing  resume home meds and IV hydralazine ordered - Continue added Norvasc, continue Coreg, clonidine, hydralazine, Imdur,  - soft BPs noted on precedex, agree with nephrology, changed BP meds to PRN to avoid prolonged low BP   Opioid Dependence/chronic back pain -PDMP does not show any recent opioid Rx in last 2 years -verified with PCP office pt is on norco 5/325 q 6 hrs prn>>restart - DC Norco 8/13, not controlling pain and concern for sedation - Start Tramadol 26m po q6h prn pain 8/13  Chest pain / longstanding angina / CAD - appreciate cardiology consult s/p DES to LAD and D1 in 10/2012 with nonobstructive disease along RCA and LCx. Most recent ischemic evaluation was a low-risk NST in 04/2022 - he is being treated medically with ASA 81 mg, coreg, pravastatin - not cath candidate due to CKD   Leukocytosis  - stress reaction likely vs UTI/ bacteremia -Trending down - follow,  no s/s of    Hypokalemia resolved - repleted previously - mag 2.1   Essential hypertension  - resumed home carvedilol, isosorbide, hydralazine, clonidine, Norvasc  Diabetes mellitus, type 2, controlled with renal complications - continue close CBG monitoring and  SSI coverage    Paroxysmal Atrial Fibrillation  - new diagnosis this admission -  His CHA2DS2-VASc Score is at least 6. Has not yet been started on anticoagulation, likely due to his chronic anemia  -Unless noted to have definitive atrial fibrillation, would prefer not to commit to long-term anticoagulation.  -cardiology following -not pursuing chronic anticoagulation therapy   Anemia of chronic renal disease - low threshold to transfuse for Hg<8 as it contributes to chest pain and respiratory distress  - Transfuse 2 units prbc for Hgb 7.1 on 8/13   GERD  - protonix for GI protection    Nausea - cont zofran prn   Hyperlipidemia - resume statin   Gout , increased in generalized pain 8/13 - continue allopurinolol  - watch very closely in setting of aggressive diuresis and CKD  -start low dose prednisone 28m po qday to see if this will help with generalized pain   Obesity class I - BMI 33.88 - lifestyle modification  Constipation -colace 1058mpo qday x 3 days start 8/13     Ethics: Discussed with patient and his brothers and family at bedside 08/06/2022 Palliative care consult, patient remains full code  And lieu of multiple comorbidities including acute renal failure, heart failure we are recommending DNR status, pursuing with palliative care-expecting steep decline     Family Communication:   brother updated at bedside 8/15  Consultants:  renal, pulm, cardiology  Code Status:  FULL    DVT Prophylaxis:  [place] Heparin      Procedures: As Listed in Progress Note Above   Antibiotics: Vanco 8/12-> Cefepime 8/12->           Physical Exam:   General:  Lethargic-AAO x 2,  cooperative, no distress;   HEENT:  Normocephalic, PERRL, otherwise with in Normal limits   Neuro:  CNII-XII intact. , normal motor and sensation, reflexes intact   Lungs:   Clear to auscultation BL, Respirations unlabored,  No wheezes / crackles  Cardio:    S1/S2, RRR, No murmure, No Rubs or  Gallops   Abdomen:  Soft, non-tender, bowel sounds active all four quadrants, no guarding or peritoneal signs.  Muscular  skeletal:  Bedbound Limited exam -severe global generalized weaknesses - in bed, able to move all 4 extremities,   2+ pulses,  symmetric, No pitting edema  Skin:  Dry, warm to touch, negative for any Rashes,  Wounds: Please see nursing documentation           Data Reviewed:    Latest Ref Rng & Units 08/08/2022    4:25 AM 08/07/2022    5:15 AM 08/06/2022    3:15 PM  CBC  WBC 4.0 - 10.5 K/uL 9.8  9.9  9.6   Hemoglobin 13.0 - 17.0 g/dL 8.3  8.4  8.6   Hematocrit 39.0 - 52.0 % 26.8  27.3  28.2   Platelets 150 - 400 K/uL 355  357  317       Latest Ref Rng & Units 08/08/2022    4:25 AM 08/07/2022    5:15 AM 08/06/2022    3:15 PM  BMP  Glucose 70 - 99 mg/dL 70 - 99 mg/dL 157    158  201  357  BUN 8 - 23 mg/dL 8 - 23 mg/dL 96    96  114  149   Creatinine 0.61 - 1.24 mg/dL 0.61 - 1.24 mg/dL 3.11    3.05  4.00  4.52   Sodium 135 - 145 mmol/L 135 - 145 mmol/L 146    147  149  147   Potassium 3.5 - 5.1 mmol/L 3.5 - 5.1 mmol/L 4.5    4.5  4.2  4.5   Chloride 98 - 111 mmol/L 98 - 111 mmol/L 107    107  112  109   CO2 22 - 32 mmol/L 22 - 32 mmol/L 29    30  27  21    Calcium 8.9 - 10.3 mg/dL 8.9 - 10.3 mg/dL 9.4    9.6  9.8  9.7      Time spent: 45 minutes  Author: Deatra James, MD 08/08/2022 11:20 AM  For on call review [email or web address].

## 2022-08-08 NOTE — Procedures (Signed)
Received patient in bed to unit.  Alert and oriented.  Informed consent signed and in  chart.   Treatment initiated: 1000 Treatment completed: 1300  Patient tolerated well. Vital signs were stable, low heart rate consistent throughout treatment 947-55/beats per minute).  Nurse and tech transported back to the room  alert, without acute distress.  Hand-off given to patient's nurse Bethena Roys [place], LPN.  Access used: Cath Access issues: No issues during treatment  Total UF removed: Programed Tablo for 0.1 (order to keep even)  Medication(s) given: Dilaudid give for pain.  Post HD VS:  Temp: 98.1 oral BP 133/46 HR 52 RR 15 Oxygen sat 100%RA  Pre HD weight:109.4 kg Post HD weight: 110.2 kg   Kimber Relic [facility]

## 2022-08-08 NOTE — Consult Note (Signed)
   [facility] CM Inpatient Consult   08/08/2022  EDD REPPERT December 21, 1946 [id number]  [place] Organization [ACO] Patient: [name] Medicare  Primary Care Provider:  Redmond School, MD, [facility] is an Independent Embedded provider with a Care Management team and program and is listed for the [facility] follow up needs   Patient screened for length of stay hospitalization with noted extreme high risk score for unplanned readmission risk.  Reviewed to assess for potential [place] Management service needs for post hospital transition for readmission prevention needs.  Review of patient's medical record reveals patient is in ICU.  Plan:  Continue to follow progress and disposition to assess for post hospital care management needs.  Pending disposition for post hospital needs.  For questions contact:   Natividad Brood, RN BSN [facility] Liaison  [phone] business mobile phone Toll free office [phone]  Fax number: [phone] [email or web address]

## 2022-08-08 NOTE — Progress Notes (Signed)
Palliative:  Mr. Lafon was seen in HD.  He is resting comfortably in bed.  He greets me, making and somewhat keeping eye contact.  He appears overall well developed, but tired.  He is alert and oriented, able to make his basic needs known.  There is no family at bedside at this time.    Mr. Gulley tells me that he did okay with dialysis, HD nurse is present and agrees.  He denies nausea at this time.  He tells me that he slept better last night.  We talk about discharge to short-term rehab after third HD session, possibly Saturday.   Conference with attending, bedside nursing staff, HD RN, and [facility] team related to the above assessment and plan.   Plan:   At this point continue full scope/full code.  Anticipate discharge to short-term rehab at [facility] after third HD session tentatively scheduled for Saturday 8/19. First outpatient on Monday 8/21.    39 minutes  Quinn Axe, NP Palliative Medicine Team  Team Phone [phone] Greater than 50% of this time was spent counseling and coordinating care related to the above assessment and plan.

## 2022-08-08 NOTE — Progress Notes (Signed)
PT Cancellation Note  Patient Details Name: [name] MRN: [id number] DOB: [birth year]   Cancelled Treatment:    Reason Eval/Treat Not Completed: Patient at procedure or test/unavailable; Attempted PT session this morning but patient leaving for dialysis. Will check back if time permits.  9:41 AM, 08/08/22 Mearl Latin PT, DPT Physical Therapist at [facility]

## 2022-08-08 NOTE — TOC Progression Note (Signed)
Transition of Care [facility]) - Progression Note    Patient Details  Name: [name] MRN: [id number] Date of Birth: Nov 27, 1946  [facility] CM/SW Contact  Salome Arnt, [place] Phone Number: 08/08/2022, 10:55 AM  Clinical Narrative:  Per MD, possible d/c Saturday. SNF updated and given chair time for dialysis (MWF 12:00) with tentative start date of Monday, August 21. Will ask CMA to start SNF auth tomorrow morning. TOC will continue to follow.       Expected Discharge Plan: [place] Barriers to Discharge: Continued Medical Work up  Expected Discharge Plan and Services Expected Discharge Plan: [place] In-house Referral: Clinical Social Work   Post Acute Care Choice: [place] Living arrangements for the past 2 months: [place] Determinants of Health (SDOH) Interventions    Readmission Risk Interventions    08/02/2022    2:23 PM  Readmission Risk Prevention Plan  Transportation Screening Complete  Medication Review (RN Care Manager) Complete  HRI or Home Care Consult Complete  SW Recovery Care/Counseling Consult Complete  Palliative Care Screening Not Applicable  Skilled Nursing Facility Complete

## 2022-08-08 NOTE — Progress Notes (Signed)
Subjective:    Had his first HD yesterday without incident-  300 of UOP-  BUN and sodium both down-- no longer on d5w because is drinking some.  He is more comfortable today -  "stomach better but not much"   Objective Vital signs in last 24 hours: Vitals:   08/07/22 2230 08/07/22 2354 08/08/22 0628 08/08/22 0703  BP: (!) 149/49 (!) 143/53 (!) 156/55   Pulse: (!) 47 (!) 53 (!) 53   Resp:   20   Temp:   98.3 F (36.8 C)   TempSrc:      SpO2:   95%   Weight:    107.6 kg  Height:       Weight change:   Intake/Output Summary (Last 24 hours) at 08/08/2022 0731 Last data filed at 08/07/2022 1804 Gross per 24 hour  Intake 963 ml  Output 300 ml  Net 663 ml    Assessment/Plan: 76 year old BM with DM, HTN, diastolic heart failure,  CAD and CKD-  presenting with SOB and CP   1.AKI- advanced CKD at baseline.  He then had volume overload contributing to respiratory failure.  - There has been no acute indication for dialysis-  he was diuresing well with meds- said he would want if it were to become needed. BUN and crt were climbing and he was not thriving -  I was in favor starting HD for clearance-  he was agreeable-  I have talked to family as well.  Now s/p [facility]-  for first HD 8/16-  will plan for second today 8/17 and third on Saturday-  will need placement to accomodate HD as I feel this will be permanent-  ESRD-  appreciate SW-  family leaning toward [facility] for HD needs when ready for discharge.  Hoping that getting the BUN down will help with his stomach complaints- possibly some improvement  2. Hypertension/volume  - Volume status improved. Now with high BUN and high sodium -  off diuretic and was on D5w via IV as is not alert enough go encourage drinking, also nauseated.  Maybe can drink a little more today  Is also on hydralazine 100 tid/coreg 6.25 BID and clonidine 0.1 BID   3. Anemia  - has been getting iron and ESA as outpatient.  S/p IV iron and on aranesp 200 mcg  every Tuesday -  hgb stable in the 8's   4. Hypokalemia-  resolved   5. Acute systolic heart failure  cardiology following.  Medical management recommended at this time per charting. Optimize volume status as above   6. Acute hypoxic resp failure - extubated. Optimize volume status above. Good sats on room air now   7. Will need to check PTH at some point-  phos is Ok on no binder    [facility]    Labs: Basic Metabolic Panel: Recent Labs  Lab 08/06/22 1515 08/07/22 0515 08/08/22 0425  NA 147* 149* 146*  147*  K 4.5 4.2 4.5  4.5  CL 109 112* 107  107  CO2 21* 27 29  30   GLUCOSE 357* 201* 157*  158*  BUN 149* 114* 96*  96*  CREATININE 4.52* 4.00* 3.11*  3.05*  CALCIUM 9.7 9.8 9.4  9.6  PHOS 3.7 3.6 3.7   Liver Function Tests: Recent Labs  Lab 08/04/22 0528 08/05/22 0507 08/06/22 1515 08/07/22 0515 08/08/22 0425  AST 14*  --   --   --   --  ALT 12  --   --   --   --   ALKPHOS 54  --   --   --   --   BILITOT 0.6  --   --   --   --   PROT 6.9  --   --   --   --   ALBUMIN 2.7*   < > 3.1* 2.9* 2.9*   < > = values in this interval not displayed.   No results for input(s): "LIPASE", "AMYLASE" in the last 168 hours. No results for input(s): "AMMONIA" in the last 168 hours. CBC: Recent Labs  Lab 08/05/22 0509 08/06/22 0430 08/06/22 1515 08/07/22 0515 08/08/22 0425  WBC 9.9 12.0* 9.6 9.9 9.8  HGB 8.0* 8.5* 8.6* 8.4* 8.3*  HCT 25.8* 27.2* 28.2* 27.3* 26.8*  MCV 100.0 100.7* 102.9* 100.4* 100.0  PLT 286 362 317 357 355   Cardiac Enzymes: No results for input(s): "CKTOTAL", "CKMB", "CKMBINDEX", "TROPONINI" in the last 168 hours. CBG: Recent Labs  Lab 08/07/22 0722 08/07/22 1108 08/07/22 1614 08/07/22 2030 08/08/22 0727  GLUCAP 171* 235* 217* 252* 158*    Iron Studies: No results for input(s): "IRON", "TIBC", "TRANSFERRIN", "FERRITIN" in the last 72 hours. Studies/Results: DG Abd 1 View  Result Date: 08/07/2022 CLINICAL DATA:   76 year old male with history of abdominal pain. EXAM: ABDOMEN - 1 VIEW COMPARISON:  05/22/2021, 03/15/2015 FINDINGS: Diffuse intraluminal gas without evidence of significant distension. Moderate colonic stool burden. No air-fluid levels. Spinal stimulator in place. Status post lumbar fusion. IMPRESSION: Nonspecific, nonobstructive bowel gas pattern. Moderate colonic stool burden. Electronically Signed   By: Ruthann Cancer M.D.   On: 08/07/2022 09:36   DG Chest Portable 1 View  Result Date: 08/06/2022 CLINICAL DATA:  Line placement, dialysis catheter EXAM: PORTABLE CHEST 1 VIEW COMPARISON:  07/31/2022 FINDINGS: Cardiomegaly. Large-bore right neck multi lumen vascular catheter, tip positioned near the superior cavoatrial junction. Both lungs are clear. The visualized skeletal structures are unremarkable. IMPRESSION: 1. Cardiomegaly without acute abnormality of the lungs in AP portable projection. 2. Large-bore right neck multi lumen vascular catheter, tip positioned near the superior cavoatrial junction. Electronically Signed   By: Delanna Ahmadi M.D.   On: 08/06/2022 08:38   DG C-Arm 1-60 Min-No Report  Result Date: 08/06/2022 Fluoroscopy was utilized by the requesting physician.  No radiographic interpretation.   Medications: Infusions:  sodium chloride     ceFEPime (MAXIPIME) IV 1 g (08/08/22 0321)   promethazine (PHENERGAN) injection (IM or IVPB)      Scheduled Medications:  acetaminophen  1,000 mg Oral Q6H   acetaminophen  650 mg Oral Once   allopurinol  100 mg Oral Daily   amLODipine  10 mg Oral Daily   aspirin  81 mg Oral Daily   atorvastatin  80 mg Oral Daily   carvedilol  6.25 mg Oral BID WC   Chlorhexidine Gluconate Cloth  6 each Topical Q0600   cloNIDine  0.1 mg Oral BID   darbepoetin (ARANESP) injection - NON-DIALYSIS  200 mcg Subcutaneous Q Tue-1800   feeding supplement  237 mL Oral BID BM   heparin  5,000 Units Subcutaneous Q8H   hydrALAZINE  100 mg Oral TID   insulin aspart   0-5 Units Subcutaneous QHS   insulin aspart  0-9 Units Subcutaneous TID WC   insulin glargine-yfgn  5 Units Subcutaneous QHS   isosorbide mononitrate  120 mg Oral Daily   ondansetron (ZOFRAN) IV  4 mg Intravenous Q6H   pantoprazole  40 mg Oral Daily   polyethylene glycol  17 g Oral Daily   predniSONE  10 mg Oral Q breakfast   senna  2 tablet Oral Daily   sodium chloride flush  3 mL Intravenous Q12H   sorbitol, milk of mag, mineral oil, glycerin (SMOG) enema  960 mL Rectal Once    have reviewed scheduled and prn medications.  Physical Exam: General: more alert today for me-  asking what time dialysis will be   Heart: RRR Lungs: mostly clear-  poor effort Abdomen: obese, soft, non tender Extremities: non pitting edema  Dialysis Access: new right sided TDC    08/08/2022,7:31 AM  LOS: 10 days

## 2022-08-08 NOTE — Progress Notes (Signed)
Nutrition Follow-up   DOCUMENTATION CODES:   Obesity unspecified  INTERVENTION:  When patient is cleared for full liquids:-Nepro Shake po TID, each supplement provides 425 kcal and 19 grams protein   Renal vitamin  NUTRITION DIAGNOSIS:  Inadequate oral intake related to inability to eat as evidenced by NPO status/clear liquids.   -continues  GOAL:  Pt to meet >/= 90% of their estimated nutrition needs    -Not met  MONITOR:  Po intake, labs and wt trends   ASSESSMENT: PMH: CKD-IV, HTN, CHF, GERD, Gout and CAD s/p stent.  Presented with shortness of breath, chest pain, UTI. Acute heart failure exacerbation and atrial fibrillation, acute respiratory failure with hypoxia.  8/7 intubated 8/9 extubated  8/15 Right IJ tunneled hemodialysis catheter placed 8/16 Patient had first dialysis treatment. Weight 107.6 kg.  8/17 Patient intake very poor 0-50% clear liquid meals consumed. He is receiving HD today and again on Saturday. Patient complains of nausea and having sinus drainage which is causing a "bad taste" in his mouth. Encouraged him to talk with nursing regarding complaints.  Patient is more awake today and willing to try Nepro when diet is advanced. Inadequate oral intake and high risk for malnutrition.  Encouraged him to consume ONS while appetite is poor until he is able to eat as normal.   Intake/Output Summary (Last 24 hours) at 08/08/2022 1154 Last data filed at 08/08/2022 1103 Gross per 24 hour  Intake 900 ml  Output 550 ml  Net 350 ml  Since admission- (-14.2 liters)  Medications reviewed.     Latest Ref Rng & Units 08/08/2022    4:25 AM 08/07/2022    5:15 AM 08/06/2022    3:15 PM  BMP  Glucose 70 - 99 mg/dL 70 - 99 mg/dL 157    158  201  357   BUN 8 - 23 mg/dL 8 - 23 mg/dL 96    96  114  149   Creatinine 0.61 - 1.24 mg/dL 0.61 - 1.24 mg/dL 3.11    3.05  4.00  4.52   Sodium 135 - 145 mmol/L 135 - 145 mmol/L 146    147  149  147   Potassium 3.5 - 5.1  mmol/L 3.5 - 5.1 mmol/L 4.5    4.5  4.2  4.5   Chloride 98 - 111 mmol/L 98 - 111 mmol/L 107    107  112  109   CO2 22 - 32 mmol/L 22 - 32 mmol/L _0 Calcium 8.9 - 10.3 mg/dL 8.9 - 10.3 mg/dL 9.4    9.6  9.8  9.7      Diet Order:   Diet Order             Diet clear liquid Room service appropriate? Yes; Fluid consistency: Thin  Diet effective now                   EDUCATION NEEDS:   Not appropriate for education at this time  Skin:  Skin Assessment: Skin Integrity Issues: Skin Integrity Issues::  (7/20- venous statis ulcer), 8/15 chest incision  Last BM:  unknown  Height:   Ht Readings from Last 1 Encounters:  08/06/22 6' 2" (1.88 m)    Weight:   Wt Readings from Last 1 Encounters:  08/08/22 109.4 kg    Ideal Body Weight:   77 kg   BMI:  Body mass index is 30.97 kg/m.  Estimated Nutritional  Needs:   Kcal:  2100-2300  Protein:  145-154 gr  Fluid:  < 2 liters daily   Colman Cater MS,RD,CSG,LDN Contact: Shea Evans

## 2022-08-08 NOTE — Progress Notes (Signed)
New patient referral information sent to [facility], tentative schedule received for first dialysis treatment to be  Monday, August 21 at 12pm. [facility] location)

## 2022-08-09 ENCOUNTER — Encounter (HOSPITAL_COMMUNITY): Payer: Self-pay | Admitting: Vascular Surgery

## 2022-08-09 DIAGNOSIS — I5033 Acute on chronic diastolic (congestive) heart failure: Secondary | ICD-10-CM | POA: Diagnosis not present

## 2022-08-09 DIAGNOSIS — J9601 Acute respiratory failure with hypoxia: Secondary | ICD-10-CM | POA: Diagnosis not present

## 2022-08-09 DIAGNOSIS — D649 Anemia, unspecified: Secondary | ICD-10-CM | POA: Diagnosis not present

## 2022-08-09 DIAGNOSIS — R1030 Lower abdominal pain, unspecified: Secondary | ICD-10-CM | POA: Diagnosis not present

## 2022-08-09 LAB — GLUCOSE, CAPILLARY
Glucose-Capillary: 146 mg/dL — ABNORMAL HIGH (ref 70–99)
Glucose-Capillary: 257 mg/dL — ABNORMAL HIGH (ref 70–99)
Glucose-Capillary: 281 mg/dL — ABNORMAL HIGH (ref 70–99)
Glucose-Capillary: 246 mg/dL — ABNORMAL HIGH (ref 70–99)

## 2022-08-09 LAB — BASIC METABOLIC PANEL
Anion gap: 8 (ref 5–15)
BUN: 62 mg/dL — ABNORMAL HIGH (ref 8–23)
CO2: 30 mmol/L (ref 22–32)
Calcium: 9.3 mg/dL (ref 8.9–10.3)
Chloride: 103 mmol/L (ref 98–111)
Creatinine, Ser: 2.66 mg/dL — ABNORMAL HIGH (ref 0.61–1.24)
GFR, Estimated: 24 mL/min — ABNORMAL LOW (ref >60)
Glucose, Bld: 146 mg/dL — ABNORMAL HIGH (ref 70–99)
Potassium: 4 mmol/L (ref 3.5–5.1)
Sodium: 141 mmol/L (ref 135–145)

## 2022-08-09 LAB — CBC
HCT: 28.1 % — ABNORMAL LOW (ref 39.0–52.0)
Hemoglobin: 8.7 g/dL — ABNORMAL LOW (ref 13.0–17.0)
MCH: 30.7 pg (ref 26.0–34.0)
MCHC: 31 g/dL (ref 30.0–36.0)
MCV: 99.3 fL (ref 80.0–100.0)
Platelets: 341 10*3/uL (ref 150–400)
RBC: 2.83 MIL/uL — ABNORMAL LOW (ref 4.22–5.81)
RDW: 15.9 % — ABNORMAL HIGH (ref 11.5–15.5)
WBC: 9.2 10*3/uL (ref 4.0–10.5)
nRBC: 0.2 % (ref 0.0–0.2)

## 2022-08-09 LAB — BRAIN NATRIURETIC PEPTIDE: B Natriuretic Peptide: 170 pg/mL — ABNORMAL HIGH (ref 0.0–100.0)

## 2022-08-09 LAB — PARATHYROID HORMONE, INTACT (NO CA): PTH: 37 pg/mL (ref 15–65)

## 2022-08-09 MED ORDER — HYDROCORTISONE (PERIANAL) 2.5 % EX CREA
TOPICAL_CREAM | Freq: Two times a day (BID) | CUTANEOUS | Status: DC
  Filled 2022-08-09: qty 28.35

## 2022-08-09 MED ORDER — PREDNISONE 10 MG PO TABS
5.0000 mg | ORAL_TABLET | Freq: Every day | ORAL | Status: DC
  Administered 2022-08-09: 5 mg via ORAL
  Filled 2022-08-09: qty 1

## 2022-08-09 MED ORDER — SENNA 8.6 MG PO TABS
2.0000 | ORAL_TABLET | Freq: Two times a day (BID) | ORAL | Status: DC
  Administered 2022-08-09 – 2022-08-10 (×3): 17.2 mg via ORAL
  Filled 2022-08-09 (×3): qty 2

## 2022-08-09 NOTE — Progress Notes (Signed)
Progress Note   Patient: [name] TMA:[id number] DOB: 1946-10-28 DOA: 07/28/2022     11 DOS: the patient was seen and examined on 08/09/2022     Subjective:  The patient was seen and examined this morning, stable much more awake alert, cooperative Apparently took the laxatives... Has had a bowel movement Reporting improved abdominal pain   Brief hospital course: [name] is a 76 y.o. male with medical history significant of hypertension, hyperlipidemia, diastolic heart failure, CAD s/p stent, CKD stage IV, and obesity who presents with complaints of shortness of breath and chest pain starting 2 days ago.  Chest pain is substernal and feels like pressure. He chronically has orthopnea and has a bed that he always sleeps sitting up.  He has been feeling short of breath even when he is not doing anything which is not normal for him.  Denies having any fever, nausea, vomiting, diarrhea, abdominal pain, or diaphoresis.   Patient reports that he is followed by Dr. Harl Bowie of cardiology and at the end of June had been recommended to increase his Imdur to 60 mg twice daily.  He states that this initially helped decrease symptoms of chest pain.  Then he was told to start taking furosemide 40 mg twice daily by his nephrologist Dr. Theador Hawthorne on 6/12 and was started on torsemide 100 mg daily.  Since doing that patient reports that he has been swelling more.  He developed a blister on his left leg due to the swelling and he has been putting ointment on it.  Denies having any redness or erythema from the wound.   Upon admission into the emergency patient was seen to be afebrile with blood pressures 140/82 to 158/73 and all other vital signs maintained.  Labs significant for WBC 11.6, hemoglobin 8.2, potassium 3.2, BUN 74, creatinine 3.38, glucose 170, BNP 651, and high-sensitivity troponin 42. Chest x-ray showed cardiomegaly with vascular congestion.  Patient has been given Lasix 40 mg IV, potassium  chloride 40 mEq, and magnesium oxide 800 mg p.o.   07/29/2022: Pt decompensated on med surg unit and rapid response called and patient placed on bipap and transferred to stepdown ICU. Cardiology and nephrology consulted and patient started on maximal dose IV lasix by nephrology.  Pt noted to be in atrial fibrillation.  Family was called by MD and updated with change in status and treatment plan.     07/30/2022: Pt was intubated on 8/7 after failed bipap trial.  Afib converted to NSR.  He has diuresed >3L on high dose lasix therapy.     08/02/22--pt requested opioids.  After verifying with PCP pt has been receiving chronic norco, it was restarted.  Continues to have nausea--suspect partly uremia from renal failure.   Start zofran OTC.  Reglan IV x 1  08/03/2022-blood culture (8/10)-> MRSA, Urine culture (8/10)-> pseudomonas, start vanco iv pharmacy to dose, cefepime iv pharmacy to dose   08/05/2022 -patient current progressively getting worse, mildly confused, anasarca, nephrologist following recommending patient to start hemodialysis, vascular surgeon Dr. Donnetta Hutching was consulted -Palliative care consulted, discussed with niece at bedside -Blood cultures confirmed staph epididymis-MRSA-discontinuing IV vancomycin 8/14  08/06/2022 -underwent HD catheter placement-planning for hemodialysis in a.m. BUN 128, creatinine 4.88 blood pressure elevated, heart rate 79 Overnight noted for 2.6-second pause, first-degree AV block noted in normal sinus rhythm  08/07/2022 -status post HD catheter placement yesterday by Dr. Donnetta Hutching. Planning for hemodialysis today-discussed with [facility] regarding scheduling outpatient hemodialysis  08/08/2022-status post first hemodialysis yesterday  08/07/2022-tolerated dialysis well, More awake but lethargic today, still complaining abdominal discomfort--- no bowel movement, KUB consistent with stillborns  08/09/2022-status post laxative, enema, 1 bowel movement-improved abdominal pain,  hemodialysis yesterday 08/08/2022, planning for hemodialysis tomorrow then discharged to [facility]rology has arranged outpatient hemodialysis- [facility] starting Monday 8/21.      Assessment and Plan:  Acute respiratory failure with hypoxia  -Resolved remain on room air for past 24-48 hours currently satting 97% on room air  -Mild shortness of breath, anasarca has improved - secondary to acute heart failure exacerbation and atrial fibrillation - Trial of bipap failed and patient intubated by PCCM Dr. Halford Chessman.  - IV lasix maximal doses ordered per nephrology -with minimal improvement  -Worsening kidney function, now initiated hemodialysis - pulmonary follow-up appreciated  - Nephrology following closely -recommending starting dialysis today 08/07/2022 - Vascular surgery Dr. Donnetta Hutching was consulted, who placed a hemodialysis catheter 08/06/2022 -Tolerated procedure well, planning for hemodialysis    - Extubated 8/9>>stable on 4L>>2L  Abdominal pain -Improved with bowel movements -KUB obtained, negative for any pathology with exception of stool burden -MiraLAX and magnesium citrate ordered --- patient refused  Patient was educated regarding compliance with treatment  -Patient has difficulty ambulating, therefore exacerbating constipation -As needed analgesic including Dilaudid ordered-which likely makes stool burden worse without stool softener laxatives  Acute HFrEF -Status post hemodialysis, still seems to be volume overloaded -First hemodialysis 08/08/2022, planning for another hemodialysis today  -Still volume overload with anasarca -IV lasix 120 mg q 12 ordered by nephrology>> minimal improvement,  - further lasix dosing per nephrology - limited echo 8/23: LVEF 35-40%, LV global hypokinesis -Monitoring daily weights   - follow I/O  - cardiology consultation appreciated>>not cath candidate, continue to follow -Planning for fluid removal via hemodialysis today  08/07/2022   Acute on chronic CKD Stage IV CKD >>> end-stage renal disease on hemodialysis now -Nephrology following, status post HD catheter placement on 08/06/2022 - baseline creatinine 3.0-3.3 - he agrees to HD if he should needed it -- discussed with pt and brother 8/9 and 8/11 and 8/12 - 8/11 uptrend in serum creatinine to 4.46 - 8/12 uptrend in serum creatinine to 4.90 - 8/13 uptrend in serum creatinine to 5.1 - 8/14 uptrending serum creatinine to 5.02 - 8/15 BUN 128, creatinine 4.88-status post HD catheter placement -816 BUN 114, creatinine 4.00 -First Hemodialysis on 08/07/2022 - 8/17 hemodialysis    Fever/ UTI, Bacteremia -Improved, afebrile, normotensive -Tmax on this admission fevere 101.0 as of 8/10  -8/10 blood cultures x2>>GPC in 1 of 2 sets, staph epididymis -Blood culture 8/10 -> MRSA -1 out of 2 culture positive for staph epididymis -Urine culture 8/10 -> Pseudomonas - Vanco 8/12-> discontinue 8/14 - Cefepime 8/12-> 08/09/2022  Hypertensive urgency -BP improving, stabilizing  resume home meds and IV hydralazine ordered - Continue added Norvasc, continue Coreg, clonidine, hydralazine, Imdur,  - soft BPs noted on precedex, agree with nephrology, changed BP meds to PRN to avoid prolonged low BP   Opioid Dependence/chronic back pain -PDMP does not show any recent opioid Rx in last 2 years -verified with PCP office pt is on norco 5/325 q 6 hrs prn>>restart - DC Norco 8/13, not controlling pain and concern for sedation - Tramadol 74m po q6h prn pain 8/13  Chest pain / longstanding angina / CAD - appreciate cardiology  s/p DES to LAD and D1 in 10/2012 with nonobstructive disease along RCA and LCx. Most recent ischemic evaluation was a low-risk NST in 04/2022 -  he is being treated medically with ASA 81 mg, coreg, pravastatin - not cath candidate due to CKD   Leukocytosis  - stress reaction likely vs UTI/ bacteremia -Trending down - follow, no s/s of     Hypokalemia - resolved - repleted previously - mag 2.1   Essential hypertension  - resumed home carvedilol, isosorbide, hydralazine, clonidine, Norvasc   Diabetes mellitus, type 2, controlled with renal complications - continue close CBG monitoring and SSI coverage    Paroxysmal Atrial Fibrillation  - new diagnosis this admission -  His CHA2DS2-VASc Score is at least 6. Has not yet been started on anticoagulation, likely due to his chronic anemia  -Unless noted to have definitive atrial fibrillation, would prefer not to commit to long-term anticoagulation.  -cardiology following -not pursuing chronic anticoagulation therapy   Anemia of chronic renal disease - low threshold to transfuse for Hg<8 as it contributes to chest pain and respiratory distress  - Transfuse 2 units prbc for Hgb 7.1 on 8/13   GERD  - protonix for GI protection    Nausea - cont zofran prn   Hyperlipidemia - resume statin   Gout , increased in generalized pain 8/13 - continue allopurinolol  - watch very closely in setting of aggressive diuresis and CKD  -start low dose prednisone 41m po -tapering down   Obesity class I - BMI 33.88 - lifestyle modification  Constipation -MiraLAX/GoLytely/Senokot bowel regimens Patient has been refusing -Status post smog enema     Ethics: Discussed with patient and his brothers and family at bedside 08/06/2022 Palliative care consult, patient remains full code  And lieu of multiple comorbidities including acute renal failure, heart failure we are recommending DNR status, pursuing with palliative care-expecting steep decline     Family Communication:   brother updated at bedside 8/15  Consultants:  renal, pulm, cardiology  Code Status:  FULL    DVT Prophylaxis:  [place] Heparin      Procedures: As Listed in Progress Note Above   Antibiotics: Vanco 8/12- Cefepime 8/12-> 8/18      Physical Exam:   General:  AAO x 2, more awake alert, following commands  today  HEENT:  Normocephalic, PERRL, otherwise with in Normal limits   Neuro:  CNII-XII intact. , normal motor and sensation, reflexes intact   Lungs:   Clear to auscultation BL, Respirations unlabored,  No wheezes / crackles  Cardio:    S1/S2, RRR, No murmure, No Rubs or Gallops   Abdomen:  Soft, non-tender, bowel sounds active all four quadrants, no guarding or peritoneal signs.  Muscular  skeletal:  Limited exam -severe global generalized weaknesses - in bed, able to move all 4 extremities,   2+ pulses,  symmetric, No pitting edema  Skin:  Dry, warm to touch, negative for any Rashes,  Wounds: Please see nursing documentation              Data Reviewed:    Latest Ref Rng & Units 08/09/2022    5:11 AM 08/08/2022    4:25 AM 08/07/2022    5:15 AM  CBC  WBC 4.0 - 10.5 K/uL 9.2  9.8  9.9   Hemoglobin 13.0 - 17.0 g/dL 8.7  8.3  8.4   Hematocrit 39.0 - 52.0 % 28.1  26.8  27.3   Platelets 150 - 400 K/uL 341  355  357       Latest Ref Rng & Units 08/09/2022    5:11 AM 08/08/2022    4:25 AM 08/07/2022  5:15 AM  BMP  Glucose 70 - 99 mg/dL 146  157    158  201   BUN 8 - 23 mg/dL 62  96    96  114   Creatinine 0.61 - 1.24 mg/dL 2.66  3.11    3.05  4.00   Sodium 135 - 145 mmol/L 141  146    147  149   Potassium 3.5 - 5.1 mmol/L 4.0  4.5    4.5  4.2   Chloride 98 - 111 mmol/L 103  107    107  112   CO2 22 - 32 mmol/L 30  29    30  27    Calcium 8.9 - 10.3 mg/dL 9.3  9.4    9.6  9.8      Time spent: 45 minutes  Author: Deatra James, MD 08/09/2022 12:32 PM  For on call review [email or web address].

## 2022-08-09 NOTE — Progress Notes (Signed)
Physical Therapy Treatment Patient Details Name: [name] MRN: [id number] DOB: 1946/04/18 Today's Date: 08/09/2022   History of Present Illness [name] is a 76 y.o. male with medical history significant of hypertension, hyperlipidemia, diastolic heart failure, CAD s/p stent, CKD stage IV, and obesity who presents with complaints of shortness of breath and chest pain starting 2 days ago.  Chest pain is substernal and feels like pressure. He chronically has orthopnea and has a bed that he always sleeps sitting up.  He has been feeling short of breath even when he is not doing anything which is not normal for him.  Denies having any fever, nausea, vomiting, diarrhea, abdominal pain, or diaphoresis.    PT Comments    Patient presents up in chair (assisted by nursing staff) and agreeable for therapy.  Patient demonstrates increased endurance/distance for gait training with slow labored cadence, narrow base of support and requiring increased time for making turns due to fall risk.  Patient tolerated sitting up in chair for approximately 2 hours before requesting to go back to bed due to fatigue.  Patient will benefit from continued skilled physical therapy in hospital and recommended venue below to increase strength, balance, endurance for safe ADLs and gait.     Recommendations for follow up therapy are one component of a multi-disciplinary discharge planning process, led by the attending physician.  Recommendations may be updated based on patient status, additional functional criteria and insurance authorization.  Follow Up Recommendations  Skilled nursing-short term rehab (<3 hours/day) Can patient physically be transported by private vehicle: No   Assistance Recommended at Discharge    Patient can return home with the following A lot of help with walking and/or transfers;A lot of help with bathing/dressing/bathroom;Help with stairs or ramp for entrance;Assistance with  cooking/housework   Equipment Recommendations  None recommended by PT    Recommendations for Other Services       Precautions / Restrictions Precautions Precautions: Fall Restrictions Weight Bearing Restrictions: No     Mobility  Bed Mobility Overal bed mobility: Needs Assistance Bed Mobility: Sit to Supine       Sit to supine: Min assist   General bed mobility comments: Patient present seated in chair (assisted by nursing staff), increased time labored movement when getting back in bed    Transfers Overall transfer level: Needs assistance Equipment used: Rolling walker (2 wheels) Transfers: Sit to/from Stand, Bed to chair/wheelchair/BSC Sit to Stand: Mod assist   Step pivot transfers: Min assist, Mod assist       General transfer comment: slow labored movement with most difficulty completing sit to stands    Ambulation/Gait Ambulation/Gait assistance: Min assist, Mod assist Gait Distance (Feet): 30 Feet Assistive device: Rolling walker (2 wheels) Gait Pattern/deviations: Decreased step length - right, Decreased step length - left, Decreased stride length, Narrow base of support Gait velocity: decreased     General Gait Details: increased endurance/distance for taking steps with slow labored unsteady cadence, narrow base of support and limited mostly due to fatigue   Stairs             Wheelchair Mobility    Modified Rankin (Stroke Patients Only)       Balance Overall balance assessment: Needs assistance Sitting-balance support: Feet supported, No upper extremity supported Sitting balance-Leahy Scale: Fair Sitting balance - Comments: fair/good seated at EOB   Standing balance support: Reliant on assistive device for balance, During functional activity, Bilateral upper extremity supported Standing balance-Leahy Scale: Poor Standing  balance comment: fair/poor using RW                            Cognition Arousal/Alertness:  Awake/alert Behavior During Therapy: WFL for tasks assessed/performed Overall Cognitive Status: Within Functional Limits for tasks assessed                                          Exercises General Exercises - Lower Extremity Long Arc Quad: Seated, AAROM, Strengthening, Both, 10 reps Hip Flexion/Marching: Seated, AROM, Strengthening, Both, 10 reps Toe Raises: Seated, AROM, Strengthening, Both, 10 reps Heel Raises: Seated, AROM, Strengthening, Both, 10 reps    General Comments        Pertinent Vitals/Pain Pain Assessment Pain Assessment: 0-10 Pain Score: 8  Pain Location: low back Pain Descriptors / Indicators: Sore, Discomfort Pain Intervention(s): Limited activity within patient's tolerance, Monitored during session, Repositioned    Home Living                          Prior Function            PT Goals (current goals can now be found in the care plan section) Acute Rehab PT Goals Patient Stated Goal: Go to rehab and go home PT Goal Formulation: With patient Time For Goal Achievement: 08/15/22 Potential to Achieve Goals: Good Progress towards PT goals: Progressing toward goals    Frequency    Min 3X/week      PT Plan Current plan remains appropriate    Co-evaluation              AM-PAC PT "6 Clicks" Mobility   Outcome Measure  Help needed turning from your back to your side while in a flat bed without using bedrails?: None Help needed moving from lying on your back to sitting on the side of a flat bed without using bedrails?: A Lot Help needed moving to and from a bed to a chair (including a wheelchair)?: A Little Help needed standing up from a chair using your arms (e.g., wheelchair or bedside chair)?: A Lot Help needed to walk in hospital room?: A Lot Help needed climbing 3-5 steps with a railing? : Total 6 Click Score: 14    End of Session   Activity Tolerance: Patient tolerated treatment well;Patient limited by  fatigue Patient left: in chair;with call bell/phone within reach Nurse Communication: Mobility status PT Visit Diagnosis: Unsteadiness on feet (R26.81);Other abnormalities of gait and mobility (R26.89);Muscle weakness (generalized) (M62.81)     Time: 4193-7902 PT Time Calculation (min) (ACUTE ONLY): 28 min  Charges:  $Gait Training: 8-22 mins $Therapeutic Activity: 8-22 mins                     12:32 PM, 08/09/22 [name], MPT Physical Therapist with [facility] [phone] office [phone] mobile phone

## 2022-08-09 NOTE — TOC Progression Note (Signed)
Transition of Care [facility]) - Progression Note    Patient Details  Name: UEL DAVIDOW MRN: [id number] Date of Birth: 1946/06/11  Transition of Care [facility]) CM/SW [place], [place] Phone Number: 08/09/2022, 11:23 AM  Clinical Narrative:    CSW spoke to [place], Glass blower/designer with [name] who states that pt is set up to start HD with them Monday August 21. Pt will need to arrive at 11 am on Monday with an ID and his insurance card. Pt will have HD MWF at 11:50 and needs to arrive by 11:30. CSW left VM for Debbie in admissions at [facility]. CSW will provide facility update when return call is made. TOC to start insurance auth. TOC to follow.   Expected Discharge Plan: [place] Barriers to Discharge: Continued Medical Work up  Expected Discharge Plan and Services Expected Discharge Plan: [place] In-house Referral: Clinical Social Work   Post Acute Care Choice: [place] Living arrangements for the past 2 months: [place] Determinants of Health (SDOH) Interventions    Readmission Risk Interventions    08/02/2022    2:23 PM  Readmission Risk Prevention Plan  Transportation Screening Complete  Medication Review (RN Care Manager) Complete  HRI or Home Care Consult Complete  SW Recovery Care/Counseling Consult Complete  Palliative Care Screening Not Applicable  Skilled Nursing Facility Complete

## 2022-08-09 NOTE — Care Management Important Message (Signed)
Important Message  Patient Details  Name: EVIN LOISEAU MRN: [id number] Date of Birth: 1946/01/28   Medicare Important Message Given:  Other (see comment)  Attempted to reach patient to review Medicare IM via room phone at 10:06 am and 12:12 pm.  Room phone busy upon both times.  Attempted to reach patient via cell phone [phone]) though no answer and unable to leave a voicemail as mailbox full.    Attempt made to reach Jackson Latino, brother, at [phone] to review Medicare IM.  Message left encouraging callback.      Dannette Barbara 08/09/2022, 12:16 PM

## 2022-08-09 NOTE — Progress Notes (Signed)
Patient ID: [name], male   DOB: Apr 07, 1946, 76 y.o.   MRN: [id number] [facility] Progress Note   Assessment/ Plan:   1.  End-stage renal disease: Following acute kidney injury on advanced chronic kidney disease and presenting with volume overload/respiratory failure.  Started on dialysis for worsening azotemia and failure to thrive and currently undertaking hemodialysis via [facility] with placement at [facility] kidney center starting Monday 8/21.  He will need to pursue permanent hemodialysis access placement as an outpatient.  I will order for his next hemodialysis treatment here tomorrow if he is still here otherwise, he can continue dialysis as an outpatient on Monday at [facility] kidney center if discharged to his skilled nursing facility today. 2.  Volume overload/hypertension: Improving with diuresis and anticipate to improve further with ongoing hemodialysis/ultrafiltration. 3.  Anemia of chronic disease: Status post intravenous iron and ESA with stable hemoglobin and hematocrit and without overt blood loss at this time. 4.  Acute hypoxic respiratory failure: Secondary to volume overload, improved/resolved with volume unloading.  Subjective:   Reports to be feeling somewhat better but still had some nausea earlier today.  Inquires about how long he needs to undertake dialysis-discussed that this was likely permanent.   Objective:   BP (!) 135/49 (BP Location: Left Arm)   Pulse (!) 52   Temp 97.6 F (36.4 C) (Oral)   Resp 20   Ht 6' 2"  (1.88 m)   Wt 108.2 kg   SpO2 97%   BMI 30.63 kg/m   Intake/Output Summary (Last 24 hours) at 08/09/2022 1137 Last data filed at 08/09/2022 0440 Gross per 24 hour  Intake 410 ml  Output 250.1 ml  Net 159.9 ml   Weight change:   Physical Exam: Gen: Comfortably resting in bed, awakens easily to conversation CVS: Pulse regular bradycardia, S1 and S2 normal Resp: Anteriorly clear to auscultation, no distinct rales or rhonchi.   Right IJ TDC Abd: Soft, flat, nontender, bowel sounds normal Ext: Chronic venous stasis changes with nonpitting edema  Imaging: No results found.  Labs: BMET Recent Labs  Lab 08/03/22 1131 08/04/22 0527 08/04/22 0528 08/05/22 0507 08/06/22 0430 08/06/22 1515 08/07/22 0515 08/08/22 0425 08/09/22 0511  NA 144 146* 147* 147* 150* 147* 149* 146*  147* 141  K 4.4 4.6 4.7 5.0 4.7 4.5 4.2 4.5  4.5 4.0  CL 102 105 105 107 110 109 112* 107  107 103  CO2 27 27 27 28 28  21* 27 29  30 30   GLUCOSE 224* 166* 166* 198* 174* 357* 201* 157*  158* 146*  BUN 139* 146* 148* 132* 128* 149* 114* 96*  96* 62*  CREATININE 4.90* 4.95* 5.10* 5.02* 4.88* 4.52* 4.00* 3.11*  3.05* 2.66*  CALCIUM 9.1 9.4 9.4 9.5 10.1 9.7 9.8 9.4  9.6 9.3  PHOS 5.6* 5.9*  --  4.9* 4.7* 3.7 3.6 3.7  --    CBC Recent Labs  Lab 08/06/22 1515 08/07/22 0515 08/08/22 0425 08/09/22 0511  WBC 9.6 9.9 9.8 9.2  HGB 8.6* 8.4* 8.3* 8.7*  HCT 28.2* 27.3* 26.8* 28.1*  MCV 102.9* 100.4* 100.0 99.3  PLT 317 357 355 341    Medications:     acetaminophen  1,000 mg Oral Q6H   acetaminophen  650 mg Oral Once   allopurinol  100 mg Oral Daily   amLODipine  10 mg Oral Daily   aspirin  81 mg Oral Daily   atorvastatin  80 mg Oral Daily   carvedilol  6.25  mg Oral BID WC   Chlorhexidine Gluconate Cloth  6 each Topical Q0600   Chlorhexidine Gluconate Cloth  6 each Topical Q0600   cloNIDine  0.1 mg Oral BID   darbepoetin (ARANESP) injection - NON-DIALYSIS  200 mcg Subcutaneous Q Tue-1800   feeding supplement  237 mL Oral BID BM   heparin  5,000 Units Subcutaneous Q8H   hydrALAZINE  100 mg Oral TID   hydrocortisone   Rectal BID   insulin aspart  0-5 Units Subcutaneous QHS   insulin aspart  0-9 Units Subcutaneous TID WC   insulin glargine-yfgn  5 Units Subcutaneous QHS   isosorbide mononitrate  120 mg Oral Daily   ondansetron (ZOFRAN) IV  4 mg Intravenous Q6H   pantoprazole  40 mg Oral Daily   polyethylene glycol  17 g  Oral Daily   predniSONE  5 mg Oral Q breakfast   senna  2 tablet Oral BID   sodium chloride flush  3 mL Intravenous Q12H   Elmarie Shiley, MD 08/09/2022, 11:37 AM

## 2022-08-10 DIAGNOSIS — R2689 Other abnormalities of gait and mobility: Secondary | ICD-10-CM | POA: Diagnosis not present

## 2022-08-10 DIAGNOSIS — R1084 Generalized abdominal pain: Secondary | ICD-10-CM | POA: Diagnosis not present

## 2022-08-10 DIAGNOSIS — I5032 Chronic diastolic (congestive) heart failure: Secondary | ICD-10-CM | POA: Diagnosis not present

## 2022-08-10 DIAGNOSIS — E1142 Type 2 diabetes mellitus with diabetic polyneuropathy: Secondary | ICD-10-CM | POA: Diagnosis not present

## 2022-08-10 DIAGNOSIS — E785 Hyperlipidemia, unspecified: Secondary | ICD-10-CM | POA: Diagnosis not present

## 2022-08-10 DIAGNOSIS — I5030 Unspecified diastolic (congestive) heart failure: Secondary | ICD-10-CM | POA: Diagnosis not present

## 2022-08-10 DIAGNOSIS — I160 Hypertensive urgency: Secondary | ICD-10-CM | POA: Diagnosis not present

## 2022-08-10 DIAGNOSIS — K219 Gastro-esophageal reflux disease without esophagitis: Secondary | ICD-10-CM | POA: Diagnosis not present

## 2022-08-10 DIAGNOSIS — K8000 Calculus of gallbladder with acute cholecystitis without obstruction: Secondary | ICD-10-CM | POA: Diagnosis not present

## 2022-08-10 DIAGNOSIS — E669 Obesity, unspecified: Secondary | ICD-10-CM | POA: Diagnosis present

## 2022-08-10 DIAGNOSIS — N2581 Secondary hyperparathyroidism of renal origin: Secondary | ICD-10-CM | POA: Diagnosis not present

## 2022-08-10 DIAGNOSIS — E1122 Type 2 diabetes mellitus with diabetic chronic kidney disease: Secondary | ICD-10-CM | POA: Diagnosis not present

## 2022-08-10 DIAGNOSIS — I132 Hypertensive heart and chronic kidney disease with heart failure and with stage 5 chronic kidney disease, or end stage renal disease: Secondary | ICD-10-CM | POA: Diagnosis not present

## 2022-08-10 DIAGNOSIS — N250 Renal osteodystrophy: Secondary | ICD-10-CM | POA: Diagnosis not present

## 2022-08-10 DIAGNOSIS — I5020 Unspecified systolic (congestive) heart failure: Secondary | ICD-10-CM | POA: Diagnosis not present

## 2022-08-10 DIAGNOSIS — I509 Heart failure, unspecified: Secondary | ICD-10-CM | POA: Diagnosis not present

## 2022-08-10 DIAGNOSIS — A499 Bacterial infection, unspecified: Secondary | ICD-10-CM | POA: Diagnosis not present

## 2022-08-10 DIAGNOSIS — M6281 Muscle weakness (generalized): Secondary | ICD-10-CM | POA: Diagnosis not present

## 2022-08-10 DIAGNOSIS — I2510 Atherosclerotic heart disease of native coronary artery without angina pectoris: Secondary | ICD-10-CM | POA: Diagnosis not present

## 2022-08-10 DIAGNOSIS — R262 Difficulty in walking, not elsewhere classified: Secondary | ICD-10-CM | POA: Diagnosis not present

## 2022-08-10 DIAGNOSIS — E1169 Type 2 diabetes mellitus with other specified complication: Secondary | ICD-10-CM | POA: Diagnosis not present

## 2022-08-10 DIAGNOSIS — Z515 Encounter for palliative care: Secondary | ICD-10-CM | POA: Diagnosis not present

## 2022-08-10 DIAGNOSIS — I959 Hypotension, unspecified: Secondary | ICD-10-CM | POA: Diagnosis not present

## 2022-08-10 DIAGNOSIS — R7881 Bacteremia: Secondary | ICD-10-CM | POA: Diagnosis not present

## 2022-08-10 DIAGNOSIS — G8929 Other chronic pain: Secondary | ICD-10-CM | POA: Diagnosis not present

## 2022-08-10 DIAGNOSIS — I5042 Chronic combined systolic (congestive) and diastolic (congestive) heart failure: Secondary | ICD-10-CM | POA: Diagnosis not present

## 2022-08-10 DIAGNOSIS — M549 Dorsalgia, unspecified: Secondary | ICD-10-CM | POA: Diagnosis not present

## 2022-08-10 DIAGNOSIS — G894 Chronic pain syndrome: Secondary | ICD-10-CM | POA: Diagnosis not present

## 2022-08-10 DIAGNOSIS — E782 Mixed hyperlipidemia: Secondary | ICD-10-CM | POA: Diagnosis not present

## 2022-08-10 DIAGNOSIS — I493 Ventricular premature depolarization: Secondary | ICD-10-CM | POA: Diagnosis not present

## 2022-08-10 DIAGNOSIS — A419 Sepsis, unspecified organism: Secondary | ICD-10-CM | POA: Diagnosis not present

## 2022-08-10 DIAGNOSIS — I248 Other forms of acute ischemic heart disease: Secondary | ICD-10-CM | POA: Diagnosis not present

## 2022-08-10 DIAGNOSIS — Z23 Encounter for immunization: Secondary | ICD-10-CM | POA: Diagnosis not present

## 2022-08-10 DIAGNOSIS — M1990 Unspecified osteoarthritis, unspecified site: Secondary | ICD-10-CM | POA: Diagnosis not present

## 2022-08-10 DIAGNOSIS — D631 Anemia in chronic kidney disease: Secondary | ICD-10-CM | POA: Diagnosis not present

## 2022-08-10 DIAGNOSIS — I5043 Acute on chronic combined systolic (congestive) and diastolic (congestive) heart failure: Secondary | ICD-10-CM | POA: Diagnosis not present

## 2022-08-10 DIAGNOSIS — M109 Gout, unspecified: Secondary | ICD-10-CM | POA: Diagnosis not present

## 2022-08-10 DIAGNOSIS — I429 Cardiomyopathy, unspecified: Secondary | ICD-10-CM | POA: Diagnosis not present

## 2022-08-10 DIAGNOSIS — K810 Acute cholecystitis: Secondary | ICD-10-CM | POA: Diagnosis not present

## 2022-08-10 DIAGNOSIS — M5136 Other intervertebral disc degeneration, lumbar region: Secondary | ICD-10-CM | POA: Diagnosis not present

## 2022-08-10 DIAGNOSIS — I5033 Acute on chronic diastolic (congestive) heart failure: Secondary | ICD-10-CM | POA: Diagnosis not present

## 2022-08-10 DIAGNOSIS — E1165 Type 2 diabetes mellitus with hyperglycemia: Secondary | ICD-10-CM | POA: Diagnosis not present

## 2022-08-10 DIAGNOSIS — R109 Unspecified abdominal pain: Secondary | ICD-10-CM | POA: Diagnosis not present

## 2022-08-10 DIAGNOSIS — I252 Old myocardial infarction: Secondary | ICD-10-CM | POA: Diagnosis not present

## 2022-08-10 DIAGNOSIS — D649 Anemia, unspecified: Secondary | ICD-10-CM | POA: Diagnosis not present

## 2022-08-10 DIAGNOSIS — F1120 Opioid dependence, uncomplicated: Secondary | ICD-10-CM | POA: Diagnosis present

## 2022-08-10 DIAGNOSIS — R1030 Lower abdominal pain, unspecified: Secondary | ICD-10-CM | POA: Diagnosis not present

## 2022-08-10 DIAGNOSIS — I208 Other forms of angina pectoris: Secondary | ICD-10-CM | POA: Diagnosis not present

## 2022-08-10 DIAGNOSIS — D689 Coagulation defect, unspecified: Secondary | ICD-10-CM | POA: Diagnosis not present

## 2022-08-10 DIAGNOSIS — I441 Atrioventricular block, second degree: Secondary | ICD-10-CM | POA: Diagnosis not present

## 2022-08-10 DIAGNOSIS — E1140 Type 2 diabetes mellitus with diabetic neuropathy, unspecified: Secondary | ICD-10-CM | POA: Diagnosis not present

## 2022-08-10 DIAGNOSIS — M961 Postlaminectomy syndrome, not elsewhere classified: Secondary | ICD-10-CM | POA: Diagnosis not present

## 2022-08-10 DIAGNOSIS — I700 Atherosclerosis of aorta: Secondary | ICD-10-CM | POA: Diagnosis not present

## 2022-08-10 DIAGNOSIS — Z794 Long term (current) use of insulin: Secondary | ICD-10-CM | POA: Diagnosis not present

## 2022-08-10 DIAGNOSIS — I10 Essential (primary) hypertension: Secondary | ICD-10-CM | POA: Diagnosis not present

## 2022-08-10 DIAGNOSIS — R6520 Severe sepsis without septic shock: Secondary | ICD-10-CM | POA: Diagnosis not present

## 2022-08-10 DIAGNOSIS — N184 Chronic kidney disease, stage 4 (severe): Secondary | ICD-10-CM | POA: Diagnosis not present

## 2022-08-10 DIAGNOSIS — N189 Chronic kidney disease, unspecified: Secondary | ICD-10-CM | POA: Diagnosis not present

## 2022-08-10 DIAGNOSIS — Z9861 Coronary angioplasty status: Secondary | ICD-10-CM | POA: Diagnosis not present

## 2022-08-10 DIAGNOSIS — G4733 Obstructive sleep apnea (adult) (pediatric): Secondary | ICD-10-CM | POA: Diagnosis not present

## 2022-08-10 DIAGNOSIS — I480 Paroxysmal atrial fibrillation: Secondary | ICD-10-CM | POA: Diagnosis not present

## 2022-08-10 DIAGNOSIS — N390 Urinary tract infection, site not specified: Secondary | ICD-10-CM | POA: Diagnosis not present

## 2022-08-10 DIAGNOSIS — N186 End stage renal disease: Secondary | ICD-10-CM | POA: Diagnosis not present

## 2022-08-10 DIAGNOSIS — Z992 Dependence on renal dialysis: Secondary | ICD-10-CM | POA: Diagnosis not present

## 2022-08-10 DIAGNOSIS — I130 Hypertensive heart and chronic kidney disease with heart failure and stage 1 through stage 4 chronic kidney disease, or unspecified chronic kidney disease: Secondary | ICD-10-CM | POA: Diagnosis not present

## 2022-08-10 DIAGNOSIS — R52 Pain, unspecified: Secondary | ICD-10-CM | POA: Diagnosis not present

## 2022-08-10 DIAGNOSIS — J9601 Acute respiratory failure with hypoxia: Secondary | ICD-10-CM | POA: Diagnosis not present

## 2022-08-10 DIAGNOSIS — N179 Acute kidney failure, unspecified: Secondary | ICD-10-CM | POA: Diagnosis not present

## 2022-08-10 LAB — GLUCOSE, CAPILLARY
Glucose-Capillary: 143 mg/dL — ABNORMAL HIGH (ref 70–99)
Glucose-Capillary: 170 mg/dL — ABNORMAL HIGH (ref 70–99)
Glucose-Capillary: 153 mg/dL — ABNORMAL HIGH (ref 70–99)
Glucose-Capillary: 206 mg/dL — ABNORMAL HIGH (ref 70–99)

## 2022-08-10 LAB — BRAIN NATRIURETIC PEPTIDE: B Natriuretic Peptide: 132 pg/mL — ABNORMAL HIGH (ref 0.0–100.0)

## 2022-08-10 LAB — CBC
HCT: 25.6 % — ABNORMAL LOW (ref 39.0–52.0)
Hemoglobin: 8.2 g/dL — ABNORMAL LOW (ref 13.0–17.0)
MCH: 31.7 pg (ref 26.0–34.0)
MCHC: 32 g/dL (ref 30.0–36.0)
MCV: 98.8 fL (ref 80.0–100.0)
Platelets: 320 10*3/uL (ref 150–400)
RBC: 2.59 MIL/uL — ABNORMAL LOW (ref 4.22–5.81)
RDW: 15.9 % — ABNORMAL HIGH (ref 11.5–15.5)
WBC: 9.4 10*3/uL (ref 4.0–10.5)
nRBC: 0.3 % — ABNORMAL HIGH (ref 0.0–0.2)

## 2022-08-10 MED ORDER — POLYETHYLENE GLYCOL 3350 17 G PO PACK: 17.0000 g | PACK | Freq: Every day | ORAL | 0 refills | Status: DC

## 2022-08-10 MED ORDER — SENNA 8.6 MG PO TABS: 2.0000 | ORAL_TABLET | Freq: Two times a day (BID) | ORAL | 0 refills | Status: DC

## 2022-08-10 MED ORDER — ACETAMINOPHEN 500 MG PO TABS: 1000.0000 mg | ORAL_TABLET | Freq: Four times a day (QID) | ORAL | 0 refills | Status: DC

## 2022-08-10 MED ORDER — HEPARIN SODIUM (PORCINE) 1000 UNIT/ML IJ SOLN
INTRAMUSCULAR | Status: AC
  Administered 2022-08-10: 1000 [IU]
  Filled 2022-08-10: qty 4

## 2022-08-10 NOTE — Progress Notes (Signed)
Pt has transported with EMS. Pts belonging and D/C papers sent with patient. IV was removed.

## 2022-08-10 NOTE — Progress Notes (Signed)
Received patient in bed to unit.  Alert and oriented.  Informed consent signed and in  chart.   Treatment initiated: 1107 Treatment completed:1437  Patient tolerated well.  Transported back to the room  alert, without acute distress.  Hand-off given to patient's nurse.   Access used: catheter Access issues: none  Total UF removed: 1500 ml Medication(s) given: none Post HD VS: 124/63 HR 58. R16 Post HD weight: 110kg   [facility]

## 2022-08-10 NOTE — TOC Transition Note (Signed)
Transition of Care [facility]) - CM/SW Discharge Note   Patient Details  Name: [name] MRN: [id number] Date of Birth: 09-Jan-1946  Transition of Care [facility]) CM/SW Contact:  Ihor Gully, LCSW Phone Number: 08/10/2022, 11:56 AM   Clinical Narrative:    Discharge clinicals sent to facility. RN to call report. Brother notified. [facility] to provide transport. TOC signing off.    Final next level of care: Skilled Nursing Facility Barriers to Discharge: No Barriers Identified   Patient Goals and CMS Choice Patient states their goals for this hospitalization and ongoing recovery are:: get better CMS Medicare.gov Compare Post Acute Care list provided to:: Patient Represenative (must comment) Choice offered to / list presented to : Sibling  Discharge Placement              Patient chooses bed at: Other - please specify in the comment section below: [facility]) Patient to be transferred to facility by: [place] Name of family member notified: brother, [name] Patient and family notified of of transfer: 08/10/22  Discharge Plan and Services In-house Referral: Clinical Social Work   Post Acute Care Choice: [place]                               Social Determinants of Health (SDOH) Interventions     Readmission Risk Interventions    08/02/2022    2:23 PM  Readmission Risk Prevention Plan  Transportation Screening Complete  Medication Review [employer]) Complete  HRI or [place] Complete  SW Recovery Care/Counseling Consult Complete  Palliative Care Screening Not Applicable  Skilled Nursing Facility Complete

## 2022-08-10 NOTE — Progress Notes (Addendum)
Central telemetry  reached out to this RN regarding patient's rhythm at 0550. Pt has been bradycardic all night which seems to be his trend. EKG performed and look similar to prior on 07/29/22. Pt denies Chest pain or shortness of breath. Overnight coverage Dr. Weston Anna was informed and advised to continue to monitor patient. Bryson Corona Edd Fabian

## 2022-08-10 NOTE — Discharge Summary (Addendum)
Physician Discharge Summary   Patient: [name] MRN: [id number] DOB: 1946-02-21  Admit date:     07/28/2022  Discharge date: 08/10/22  Discharge Physician: Deatra James   PCP: Redmond School, MD   Recommendations at discharge:   Currently undertaking hemodialysis via [facility] with placement at [facility] starting Monday 8/21.  Follow-up with nephrology next week Continue current medications Follow-up with PCP in 1-2 weeks  Discharge Diagnoses: Principal Problem:   (HFpEF) heart failure with preserved ejection fraction (HCC) Active Problems:   ESRD (end stage renal disease) (HCC)   CAD S/P percutaneous coronary angioplasty   Chest pain   Elevated troponin   Leukocytosis   Hypokalemia   Frequent PVCs   GERD (gastroesophageal reflux disease)   Chronic kidney disease, stage IV (severe) (HCC)   Diabetes mellitus (HCC)   Anemia, normocytic normochromic   Essential hypertension   Gout   Abdominal pain   Hypercholesterolemia   Obesity   Acute respiratory failure with hypoxia (HCC)   HFrEF (heart failure with reduced ejection fraction) ([place])  Resolved Problems:   * No resolved hospital problems. *  Hospital Course:  77 y.o. male with medical history significant of hypertension, hyperlipidemia, diastolic heart failure, CAD s/p stent, CKD stage IV, and obesity who presents with complaints of shortness of breath and chest pain starting 2 days ago.  Chest pain is substernal and feels like pressure. He chronically has orthopnea and has a bed that he always sleeps sitting up.  He has been feeling short of breath even when he is not doing anything which is not normal for him.  Denies having any fever, nausea, vomiting, diarrhea, abdominal pain, or diaphoresis.   Patient reports that he is followed by Dr. Harl Bowie of cardiology and at the end of June had been recommended to increase his Imdur to 60 mg twice daily.  He states that this initially helped decrease symptoms  of chest pain.  Then he was told to start taking furosemide 40 mg twice daily by his nephrologist Dr. Theador Hawthorne on 6/12 and was started on torsemide 100 mg daily.  Since doing that patient reports that he has been swelling more.  He developed a blister on his left leg due to the swelling and he has been putting ointment on it.  Denies having any redness or erythema from the wound.   Upon admission into the emergency patient was seen to be afebrile with blood pressures 140/82 to 158/73 and all other vital signs maintained.  Labs significant for WBC 11.6, hemoglobin 8.2, potassium 3.2, BUN 74, creatinine 3.38, glucose 170, BNP 651, and high-sensitivity troponin 42. Chest x-ray showed cardiomegaly with vascular congestion.  Patient has been given Lasix 40 mg IV, potassium chloride 40 mEq, and magnesium oxide 800 mg p.o.  07/29/2022: Pt decompensated on med surg unit and rapid response called and patient placed on bipap and transferred to stepdown ICU. Cardiology and nephrology consulted and patient started on maximal dose IV lasix by nephrology.  Pt noted to be in atrial fibrillation.  Family was called by MD and updated with change in status and treatment plan.    07/30/2022: Pt was intubated on 8/7 after failed bipap trial.  Afib converted to NSR.  He has diuresed >3L on high dose lasix therapy.    08/02/22--pt requested opioids.  After verifying with PCP pt has been receiving chronic norco, it was restarted.  Continues to have nausea--suspect partly uremia from renal failure.   Start zofran OTC.  Reglan IV x 1 /11/2022-blood culture (8/10)-> MRSA, Urine culture (8/10)-> pseudomonas, start vanco iv pharmacy to dose, cefepime iv pharmacy to dose     08/05/2022 -patient current progressively getting worse, mildly confused, anasarca, nephrologist following recommending patient to start hemodialysis, vascular surgeon Dr. Donnetta Hutching was consulted -Palliative care consulted, discussed with niece at bedside -Blood cultures  confirmed staph epididymis-MRSA-discontinuing IV vancomycin 8/14   08/06/2022 -underwent HD catheter placement-planning for hemodialysis in a.m. BUN 128, creatinine 4.88 blood pressure elevated, heart rate 79 Overnight noted for 2.6-second pause, first-degree AV block noted in normal sinus rhythm   08/07/2022 -status post HD catheter placement yesterday by Dr. Donnetta Hutching. Planning for hemodialysis today-discussed with [facility] regarding scheduling outpatient hemodialysis   08/08/2022-status post first hemodialysis yesterday 08/07/2022-tolerated dialysis well, More awake but lethargic today, still complaining abdominal discomfort--- no bowel movement, KUB consistent with stillborns   08/09/2022-status post laxative, enema, 1 bowel movement-improved abdominal pain, hemodialysis yesterday 08/08/2022, planning for hemodialysis tomorrow then discharged to [facility] has arranged outpatient hemodialysis- [place] [facility] starting Monday 8/21.    08/10/2022, status post hemodialysis, cleared for discharge to SNF,  Acute respiratory failure with hypoxia  -Resolved remain on room air for past 24-48 hours currently satting 97% on room air   -Mild shortness of breath, anasarca has improved - secondary to acute heart failure exacerbation and atrial fibrillation - Trial of bipap failed and patient intubated by PCCM Dr. Halford Chessman.  - IV lasix maximal doses ordered per nephrology -with minimal improvement  -Worsening kidney function, now initiated hemodialysis - pulmonary follow-up appreciated   - Nephrology following closely -recommending starting dialysis today 08/07/2022 - Vascular surgery Dr. Donnetta Hutching was consulted, who placed a hemodialysis catheter 08/06/2022 -Tolerated procedure well, planning for hemodialysis      - Extubated 8/9>>stable on 4L>>2L   Abdominal pain -Improved with bowel movements -KUB obtained, negative for any pathology with exception of stool burden -MiraLAX and magnesium citrate  ordered --- patient refused  Patient was educated regarding compliance with treatment   -Patient has difficulty ambulating, therefore exacerbating constipation -As needed analgesic including Dilaudid ordered-which likely makes stool burden worse without stool softener laxatives   Acute HFrEF -Status post hemodialysis, still seems to be volume overloaded -First hemodialysis 08/08/2022, planning for another hemodialysis today   -Still volume overload with anasarca -IV lasix 120 mg q 12 ordered by nephrology>> minimal improvement,  - further lasix dosing per nephrology - limited echo 8/23: LVEF 35-40%, LV global hypokinesis -Monitoring daily weights   - follow I/O  - cardiology consultation appreciated>>not cath candidate, continue to follow -Planning for fluid removal via hemodialysis today 08/07/2022   Acute on chronic CKD Stage IV CKD >>> end-stage renal disease on hemodialysis now -Nephrology following, status post HD catheter placement on 08/06/2022 - baseline creatinine 3.0-3.3 - he agrees to HD if he should needed it -- discussed with pt and brother 8/9 and 8/11 and 8/12 - 8/11 uptrend in serum creatinine to 4.46 - 8/12 uptrend in serum creatinine to 4.90 - 8/13 uptrend in serum creatinine to 5.1 - 8/14 uptrending serum creatinine to 5.02 - 8/15 BUN 128, creatinine 4.88-status post HD catheter placement -816 BUN 114, creatinine 4.00 -First Hemodialysis on 08/07/2022 - 8/17 hemodialysis -08/10/22 Hemodialysis     Fever/ UTI, Bacteremia -Improved, afebrile, normotensive -Tmax on this admission fevere 101.0 as of 8/10  -8/10 blood cultures x2>>GPC in 1 of 2 sets, staph epididymis -Blood culture 8/10 -> MRSA -1 out of 2 culture positive for staph epididymis -  Urine culture 8/10 -> Pseudomonas - Vanco 8/12-> discontinue 8/14 - Cefepime 8/12-> 08/09/2022   Hypertensive urgency -BP improving, stabilizing  resume home meds and IV hydralazine ordered - Continue added Norvasc,  continue Coreg, clonidine, hydralazine, Imdur,  - soft BPs noted on precedex, agree with nephrology, changed BP meds to PRN to avoid prolonged low BP   Opioid Dependence/chronic back pain -PDMP does not show any recent opioid Rx in last 2 years -verified with PCP office pt is on norco 5/325 q 6 hrs prn>>restart - DC Norco 8/13, not controlling pain and concern for sedation - Tramadol 101m po q6h prn pain 8/13   Chest pain / longstanding angina / CAD - appreciate cardiology  s/p DES to LAD and D1 in 10/2012 with nonobstructive disease along RCA and LCx. Most recent ischemic evaluation was a low-risk NST in 04/2022 - he is being treated medically with ASA 81 mg, coreg, pravastatin - not cath candidate due to CKD   Leukocytosis  - stress reaction likely vs UTI/ bacteremia -Trending down - follow, no s/s of    Hypokalemia - resolved - repleted previously - mag 2.1   Essential hypertension  - resumed home carvedilol, isosorbide, hydralazine, clonidine, Norvasc    Diabetes mellitus, type 2, controlled with renal complications - continue close CBG monitoring and SSI coverage    Paroxysmal Atrial Fibrillation  - new diagnosis this admission -  His CHA2DS2-VASc Score is at least 6. Has not yet been started on anticoagulation, likely due to his chronic anemia  -Unless noted to have definitive atrial fibrillation, would prefer not to commit to long-term anticoagulation.  -cardiology following -not pursuing chronic anticoagulation therapy   Anemia of chronic renal disease - low threshold to transfuse for Hg<8 as it contributes to chest pain and respiratory distress  - Transfuse 2 units prbc for Hgb 7.1 on 8/13   GERD  - protonix for GI protection    Nausea - cont zofran prn    Hyperlipidemia - resume statin   Gout , increased in generalized pain 8/13 - continue allopurinolol  - watch very closely in setting of aggressive diuresis and CKD  -start low dose prednisone 277mpo  -tapering down   Obesity class I - BMI 33.88 - lifestyle modification   Constipation -MiraLAX/GoLytely/Senokot bowel regimens Patient has been refusing -Status post smog enema     Ethics: Discussed with patient and his brothers and family at bedside 08/06/2022 Palliative care consult, patient remains full code   And lieu of multiple comorbidities including acute renal failure, heart failure we are recommending DNR status, pursuing with palliative care-expecting steep decline     Family Communication:   Niece present at bedside, updated 08/10/2022  Consultants: Nephrology, pulm, cardiology  Code Status:  FULL    D     Procedures: The patient extubation, hemodialysis   Antibiotics: Vanco 8/12- Cefepime 8/12-> 8/19       Procedures performed: Hemodialysis second hemodialysis 08/10/2022 Disposition: Skilled nursing facility Diet recommendation:  Discharge Diet Orders (From admission, onward)     Start     Ordered   08/10/22 0000  Diet - low sodium heart healthy        08/10/22 1117           Renal diet DISCHARGE MEDICATION: Allergies as of 08/10/2022       Reactions   Metformin Nausea And Vomiting   Niacin    Niacin And Related Other (See Comments)   Reaction: flushing and burning side  effects even with extended release        Medication List     STOP taking these medications    colchicine 0.6 MG tablet   dicyclomine 10 MG capsule Commonly known as: BENTYL   diphenoxylate-atropine 2.5-0.025 MG tablet Commonly known as: LOMOTIL   furosemide 40 MG tablet Commonly known as: LASIX   gabapentin 300 MG capsule Commonly known as: NEURONTIN   HYDROcodone-acetaminophen 10-325 MG tablet Commonly known as: NORCO   meclizine 25 MG tablet Commonly known as: ANTIVERT   pantoprazole 40 MG tablet Commonly known as: PROTONIX       TAKE these medications    acarbose 50 MG tablet Commonly known as: PRECOSE Take 50 mg by mouth in the morning, at  noon, and at bedtime.   acetaminophen 500 MG tablet Commonly known as: TYLENOL Take 2 tablets (1,000 mg total) by mouth every 6 (six) hours.   allopurinol 100 MG tablet Commonly known as: ZYLOPRIM Take 100 mg by mouth daily.   amLODipine 10 MG tablet Commonly known as: NORVASC Take 10 mg by mouth every evening.   aspirin EC 81 MG tablet Take 1 tablet (81 mg total) by mouth daily.   calcitRIOL 0.25 MCG capsule Commonly known as: ROCALTROL Take 0.25 mcg by mouth every Sunday.   carboxymethylcellulose 0.5 % Soln Commonly known as: REFRESH PLUS Place 1 drop into both eyes daily as needed (dry eyes).   carvedilol 6.25 MG tablet Commonly known as: COREG Take 6.25 mg by mouth 2 (two) times daily with a meal.   cholecalciferol 1000 units tablet Commonly known as: VITAMIN D Take 1,000 Units daily by mouth.   cloNIDine 0.1 MG tablet Commonly known as: CATAPRES Take 0.1 mg by mouth 3 (three) times daily.   epoetin alfa 3000 UNIT/ML injection Commonly known as: EPOGEN Inject into the skin.   hydrALAZINE 100 MG tablet Commonly known as: APRESOLINE Take 200 mg by mouth 2 (two) times daily.   insulin NPH Human 100 UNIT/ML injection Commonly known as: NovoLIN N Inject 0.18 mLs (18 Units total) into the skin every morning. Ands syringes 1/day What changed:  how much to take when to take this reasons to take this additional instructions   isosorbide mononitrate 60 MG 24 hr tablet Commonly known as: IMDUR Take 1 tablet (60 mg total) by mouth 2 (two) times daily.   Januvia 25 MG tablet Generic drug: sitaGLIPtin   mupirocin ointment 2 % Commonly known as: BACTROBAN Apply 1 Application topically 3 (three) times daily.   nitroGLYCERIN 0.4 MG SL tablet Commonly known as: NITROSTAT Place 1 tablet (0.4 mg total) under the tongue every 5 (five) minutes as needed for chest pain. PLACE ONE TABLET UNDER THE TONGUE EVERY 5 MINUTES FOR 3 DOSES AS NEEDED Strength: 0.4 mg    polyethylene glycol 17 g packet Commonly known as: MIRALAX / GLYCOLAX Take 17 g by mouth daily. Start taking on: August 11, 2022   pravastatin 40 MG tablet Commonly known as: PRAVACHOL Take 1 tablet (40 mg total) by mouth every evening.   Restasis 0.05 % ophthalmic emulsion Generic drug: cycloSPORINE 1 drop 2 (two) times daily.   senna 8.6 MG Tabs tablet Commonly known as: SENOKOT Take 2 tablets (17.2 mg total) by mouth 2 (two) times daily.   True Metrix Blood Glucose Test test strip Generic drug: glucose blood USE TO TEST BLOOD SUGAR UP TO TWICE DAILY OR AS DIRECTED  Discharge Care Instructions  (From admission, onward)           Start     Ordered   08/10/22 0000  Discharge wound care:       Comments: Per nursing instructions   08/10/22 1117            Contact information for after-discharge care     [facility] .   Service: Skilled Nursing Contact information: [address] [place] [phone]                    Discharge Exam: [name] Weights   08/08/22 1317 08/09/22 0439 08/10/22 0433  Weight: 110.2 kg 108.2 kg 111 kg      Physical Exam:   General:  AAO x 3,  cooperative, no distress;   HEENT:  Normocephalic, PERRL, otherwise with in Normal limits   Neuro:  CNII-XII intact. , normal motor and sensation, reflexes intact   Lungs:   Clear to auscultation BL, Respirations unlabored,  No wheezes / crackles  Cardio:    S1/S2, RRR, No murmure, No Rubs or Gallops   Abdomen:  Soft, non-tender, bowel sounds active all four quadrants, no guarding or peritoneal signs.  Muscular  skeletal:  Limited exam -global generalized weaknesses - in bed, able to move all 4 extremities,   2+ pulses,  symmetric, No pitting edema  Skin:  Dry, warm to touch, negative for any Rashes,  Wounds: Please see nursing documentation       Condition at discharge:  fair  The results of significant diagnostics from this hospitalization (including imaging, microbiology, ancillary and laboratory) are listed below for reference.   Imaging Studies: DG Abd 1 View  Result Date: 08/07/2022 CLINICAL DATA:  76 year old male with history of abdominal pain. EXAM: ABDOMEN - 1 VIEW COMPARISON:  05/22/2021, 03/15/2015 FINDINGS: Diffuse intraluminal gas without evidence of significant distension. Moderate colonic stool burden. No air-fluid levels. Spinal stimulator in place. Status post lumbar fusion. IMPRESSION: Nonspecific, nonobstructive bowel gas pattern. Moderate colonic stool burden. Electronically Signed   By: Ruthann Cancer M.D.   On: 08/07/2022 09:36   DG Chest Portable 1 View  Result Date: 08/06/2022 CLINICAL DATA:  Line placement, dialysis catheter EXAM: PORTABLE CHEST 1 VIEW COMPARISON:  07/31/2022 FINDINGS: Cardiomegaly. Large-bore right neck multi lumen vascular catheter, tip positioned near the superior cavoatrial junction. Both lungs are clear. The visualized skeletal structures are unremarkable. IMPRESSION: 1. Cardiomegaly without acute abnormality of the lungs in AP portable projection. 2. Large-bore right neck multi lumen vascular catheter, tip positioned near the superior cavoatrial junction. Electronically Signed   By: Delanna Ahmadi M.D.   On: 08/06/2022 08:38   DG C-Arm 1-60 Min-No Report  Result Date: 08/06/2022 Fluoroscopy was utilized by the requesting physician.  No radiographic interpretation.   DG CHEST PORT 1 VIEW  Result Date: 07/31/2022 CLINICAL DATA:  76 year old male status post endotracheal tube placement. Heart failure. EXAM: PORTABLE CHEST 1 VIEW COMPARISON:  Chest x-ray 07/31/2022. FINDINGS: An endotracheal tube is in place with tip 5.3 cm above the carina. A nasogastric tube is seen extending into the stomach, however, the tip of the nasogastric tube extends below the lower margin of the image. Lung volumes are low. Bibasilar opacities  may reflect areas of atelectasis and/or consolidation, potentially with superimposed small left pleural effusion. No definite right pleural effusion. Ill-defined opacities and areas of interstitial prominence are also noted throughout the mid to  lower lungs bilaterally. Crowding and cephalization of the pulmonary vasculature. No pneumothorax. Heart size is mildly enlarged. Upper mediastinal contours are within normal limits. IMPRESSION: 1. Support apparatus, as above. 2. The appearance the chest may suggest mild congestive heart failure. In addition, there are bibasilar opacities favored to reflect areas of subsegmental atelectasis, with superimposed small left pleural effusion. Electronically Signed   By: Vinnie Langton M.D.   On: 07/31/2022 05:18   DG Chest Portable 1 View  Result Date: 07/31/2022 CLINICAL DATA:  Endotracheal tube malposition EXAM: PORTABLE CHEST 1 VIEW COMPARISON:  None Available. FINDINGS: The endotracheal tube is seen above the level of the clavicular heads 10.9 cm above the carina. The balloon appears hyperinflated within the hypopharynx. Nasogastric tube extends into the upper abdomen beyond the margin of the examination. Pulmonary insufflation is normal and stable since prior examination. No pneumothorax or pleural effusion. Mild cardiomegaly is stable. Superimposed bilateral perihilar pulmonary infiltrates appear improved in the interval since prior examination, infection versus edema. IMPRESSION: 1. High position of the endotracheal tube. The balloon appears hyperinflated within the hypopharynx. Advancement of the endotracheal tube by 5-6 cm would better position the device. 2. Improving bilateral perihilar pulmonary infiltrates, infection versus edema. 3. Stable cardiomegaly. Electronically Signed   By: Fidela Salisbury M.D.   On: 07/31/2022 04:37   DG Chest Port 1 View  Addendum Date: 07/30/2022   ADDENDUM REPORT: 07/30/2022 08:00 ADDENDUM: Additional image done at 3:30 p.m. on  07/29/2022 is submitted for review. Tip of NG tube is seen in the fundus of the stomach. Side port an NG tube is noted close to the gastroesophageal junction. Electronically Signed   By: Elmer Picker M.D.   On: 07/30/2022 08:00   Result Date: 07/30/2022 CLINICAL DATA:  Placement of endotracheal tube and orogastric tube EXAM: PORTABLE CHEST 1 VIEW COMPARISON:  Previous studies including the examination of 07/28/2022 FINDINGS: Transverse diameter heart is increased. There is interval worsening of pulmonary vascular congestion. Interval appearance of extensive alveolar densities are seen in the parahilar regions and lower lung fields, more so on the left side. Lateral CP angles are indistinct. There is no pneumothorax. Tip of endotracheal tube is 6.6 cm above the carina. NG tube is not visualized in chest. IMPRESSION: Cardiomegaly. There is interval worsening of pulmonary vascular congestion. Extensive alveolar infiltrates are seen in both lungs suggesting pulmonary edema or multifocal pneumonia with interval worsening. NG tube is not visualized and may be coiled within the mouth or oropharynx. Electronically Signed: By: Elmer Picker M.D. On: 07/29/2022 15:46   DG Chest Port 1 View  Result Date: 07/30/2022 CLINICAL DATA:  Chest pain and dyspnea. EXAM: PORTABLE CHEST 1 VIEW COMPARISON:  Portable chest 07/29/2022 FINDINGS: 4:55 a.m. ETT tip is 6.2 cm from the carina. Interval insertion NGT with the tip in the body of stomach. There is spinal stimulator wiring in the distal thoracic spinal canal. The heart is enlarged. The aorta is tortuous and ectatic with stable mediastinum. Aortic atherosclerosis. There is persistent perihilar vascular congestion and extensive bilateral perihilar infiltrates, more confluent on the left, with bilateral peripheral sparing and small pleural effusions. No new or worsening opacities seen. Overall aeration is generally stable. IMPRESSION: Persistent left-greater-than-right  perihilar infiltrates with cardiomegaly and central vascular engorgement. Findings could be due to alveolar pulmonary edema, pneumonia or combination. Stable overall aeration noted today with no new or worsening infiltrates. Small pleural effusions appear similar. NGT tip in the body of stomach with stable ETT positioning. Aortic atherosclerosis.  Electronically Signed   By: Telford Nab M.D.   On: 07/30/2022 07:21   ECHOCARDIOGRAM LIMITED  Result Date: 07/29/2022    ECHOCARDIOGRAM LIMITED REPORT   Patient Name:   [name] Date of Exam: 07/29/2022 Medical Rec #:  [id number]       Height:       74.0 in Accession #:    [id number]      Weight:       270.7 lb Date of Birth:  April 07, 1946       BSA:          2.472 m Patient Age:    1 years        BP:           168/79 mmHg Patient Gender: M               HR:           80 bpm. Exam Location:  [facility] Procedure: Limited Echo and Intracardiac Opacification Agent Indications:    CHF  History:        Patient has prior history of Echocardiogram examinations, most                 recent 02/07/2022. CHF, CAD and Previous Myocardial Infarction,                 Arrythmias:PVC, Signs/Symptoms:Chest Pain; Risk                 Factors:Hypertension, Diabetes and Dyslipidemia. Patient on                 Bi-Pap.  Sonographer:    Wenda Low Referring Phys: [phone] Nadean Corwin HILTY  Sonographer Comments: Patient is morbidly obese. IMPRESSIONS  1. Limited echo for LVEF and regional wall motion.  2. No apical thrombus by Definity contrast. Left ventricular ejection fraction, by estimation, is 35 to 40%. Left ventricular ejection fraction by 2D MOD biplane is 36.6 %. The left ventricle has moderately decreased function. The left ventricle demonstrates severe global hypokinesis, worse at the apex. Regional wall motion is difficult to ascertain given frequent PVC's.  3. The inferior vena cava is dilated in size with <50% respiratory variability, suggesting right atrial pressure of 15  mmHg.  4. Right ventricular systolic function was not well visualized. The right ventricular size is not well visualized.  5. Rhythm strip during this exam demonstrates atrial fibrillation. Comparison(s): Changes from prior study are noted. 02/07/2022: LVEF 60-65%, no regional WMA's. FINDINGS  Left Ventricle: No apical thrombus by Definity contrast. Left ventricular ejection fraction, by estimation, is 35 to 40%. Left ventricular ejection fraction by 2D MOD biplane is 36.6 %. The left ventricle has moderately decreased function. The left ventricle demonstrates global hypokinesis. Definity contrast agent was given IV to delineate the left ventricular endocardial borders. The left ventricular internal cavity size was normal in size. There is moderate left ventricular hypertrophy. Right Ventricle: The right ventricular size is not well visualized. Right vetricular wall thickness was not well visualized. Right ventricular systolic function was not well visualized. Aorta: The aortic root and ascending aorta are structurally normal, with no evidence of dilitation. Venous: The inferior vena cava is dilated in size with less than 50% respiratory variability, suggesting right atrial pressure of 15 mmHg. EKG: Rhythm strip during this exam demonstrates atrial fibrillation. LEFT VENTRICLE PLAX 2D  Biplane EF (MOD) LVIDd:         6.80 cm         LV Biplane EF:   Left LVIDs:         4.60 cm                          ventricular LV PW:         1.40 cm                          ejection LV IVS:        1.50 cm                          fraction by LVOT diam:     2.00 cm                          2D MOD LVOT Area:     3.14 cm                         biplane is                                                 36.6 %.  LV Volumes (MOD) LV vol d, MOD    122.0 ml A2C: LV vol d, MOD    128.0 ml A4C: LV vol s, MOD    74.4 ml A2C: LV vol s, MOD    89.6 ml A4C: LV SV MOD A2C:   47.6 ml LV SV MOD A4C:   128.0 ml LV SV MOD BP:     49.2 ml LEFT ATRIUM         Index LA diam:    5.50 cm 2.22 cm/m   AORTA Ao Root diam: 3.30 cm  SHUNTS Systemic Diam: 2.00 cm Lyman Bishop MD Electronically signed by Lyman Bishop MD Signature Date/Time: 07/29/2022/4:28:09 PM    Final    DG Chest Portable 1 View  Result Date: 07/28/2022 CLINICAL DATA:  Shortness of breath, chest pain EXAM: PORTABLE CHEST 1 VIEW COMPARISON:  05/29/2022 FINDINGS: Cardiomegaly, vascular congestion. No confluent opacities, effusions or edema. No acute bony abnormality. IMPRESSION: Cardiomegaly, vascular congestion. Electronically Signed   By: Rolm Baptise M.D.   On: 07/28/2022 23:46    Microbiology: Results for orders placed or performed during the hospital encounter of 07/28/22  MRSA Next Gen by PCR, Nasal     Status: None   Collection Time: 07/29/22  9:00 AM   Specimen: Nasal Mucosa; Nasal Swab  Result Value Ref Range Status   MRSA by PCR Next Gen NOT DETECTED NOT DETECTED Final    Comment: (NOTE) The GeneXpert MRSA Assay (FDA approved for NASAL specimens only), is one component of a comprehensive MRSA colonization surveillance program. It is not intended to diagnose MRSA infection nor to guide or monitor treatment for MRSA infections. Test performance is not FDA approved in patients less than 8 years old. Performed at [facility], [address], [place], [place] [address]   Urine Culture     Status: Abnormal   Collection Time: 08/01/22  4:29 PM   Specimen: Urine, Clean Catch  Result Value Ref Range Status  Specimen Description   Final    URINE, CLEAN CATCH Performed at [facility], [address], [place], [place] [address]    Special Requests   Final    NONE Performed at [facility], [address], [place], [place] [address]    Culture >=100,000 COLONIES/mL PSEUDOMONAS AERUGINOSA (A)  Final   Report Status 08/04/2022 FINAL  Final   Organism ID, Bacteria PSEUDOMONAS AERUGINOSA (A)  Final      Susceptibility   Pseudomonas aeruginosa - MIC*     CEFTAZIDIME 4 SENSITIVE Sensitive     CIPROFLOXACIN <=0.25 SENSITIVE Sensitive     GENTAMICIN <=1 SENSITIVE Sensitive     IMIPENEM 2 SENSITIVE Sensitive     PIP/TAZO 8 SENSITIVE Sensitive     CEFEPIME 2 SENSITIVE Sensitive     * >=100,000 COLONIES/mL PSEUDOMONAS AERUGINOSA  Culture, blood (Routine X 2) w Reflex to ID Panel     Status: None   Collection Time: 08/01/22  4:54 PM   Specimen: BLOOD LEFT WRIST  Result Value Ref Range Status   Specimen Description BLOOD LEFT WRIST  Final   Special Requests   Final    BOTTLES DRAWN AEROBIC AND ANAEROBIC Blood Culture results may not be optimal due to an excessive volume of blood received in culture bottles   Culture   Final    NO GROWTH 5 DAYS Performed at [facility], [address], [place], [place] [address]    Report Status 08/06/2022 FINAL  Final  Culture, blood (Routine X 2) w Reflex to ID Panel     Status: Abnormal   Collection Time: 08/01/22  4:54 PM   Specimen: BLOOD LEFT HAND  Result Value Ref Range Status   Specimen Description   Final    BLOOD LEFT HAND Performed at [facility], [address], [place], [place] [address]    Special Requests   Final    BOTTLES DRAWN AEROBIC AND ANAEROBIC Blood Culture adequate volume Performed at [facility], [address] [place], [place] [address]    Culture  Setup Time   Final    GRAM POSITIVE COCCI Gram Stain Report Called to,Read Back By and Verified With: BERNHARD @ 1302 ON [id number] BY HENDERSON L IN BOTH AEROBIC AND ANAEROBIC BOTTLES CRITICAL RESULT CALLED TO, READ BACK BY AND VERIFIED WITH: RN BRANDI MINARD ON 08/02/22 @ 1853 BY DRT    Culture (A)  Final    STAPHYLOCOCCUS EPIDERMIDIS STAPHYLOCOCCUS HOMINIS THE SIGNIFICANCE OF ISOLATING THIS ORGANISM FROM A SINGLE SET OF BLOOD CULTURES WHEN MULTIPLE SETS ARE DRAWN IS UNCERTAIN. PLEASE NOTIFY THE MICROBIOLOGY DEPARTMENT WITHIN ONE WEEK IF SPECIATION AND SENSITIVITIES ARE REQUIRED. Performed at [facility], [place] [address], [place], [place] [address]    Report Status 08/04/2022 FINAL  Final  Blood Culture ID Panel (Reflexed)     Status: Abnormal   Collection Time: 08/01/22  4:54 PM  Result Value Ref Range Status   Enterococcus faecalis NOT DETECTED NOT DETECTED Final   Enterococcus Faecium NOT DETECTED NOT DETECTED Final   Listeria monocytogenes NOT DETECTED NOT DETECTED Final   Staphylococcus species DETECTED (A) NOT DETECTED Final    Comment: CRITICAL RESULT CALLED TO, READ BACK BY AND VERIFIED WITH: RN BRANDI MINARD ON 08/02/22 @ 1853 BY DRT    Staphylococcus aureus (BCID) NOT DETECTED NOT DETECTED Final   Staphylococcus epidermidis DETECTED (A) NOT DETECTED Final    Comment: Methicillin (oxacillin) resistant coagulase negative staphylococcus. Possible blood culture contaminant (unless isolated from more  than one blood culture draw or clinical case suggests pathogenicity). No antibiotic treatment is indicated for blood  culture contaminants. CRITICAL RESULT CALLED TO, READ BACK BY AND VERIFIED WITH: RN BRANDI MINARD ON 08/02/22 @ 1853 BY DRT    Staphylococcus lugdunensis NOT DETECTED NOT DETECTED Final   Streptococcus species NOT DETECTED NOT DETECTED Final   Streptococcus agalactiae NOT DETECTED NOT DETECTED Final   Streptococcus pneumoniae NOT DETECTED NOT DETECTED Final   Streptococcus pyogenes NOT DETECTED NOT DETECTED Final   A.calcoaceticus-baumannii NOT DETECTED NOT DETECTED Final   Bacteroides fragilis NOT DETECTED NOT DETECTED Final   Enterobacterales NOT DETECTED NOT DETECTED Final   Enterobacter cloacae complex NOT DETECTED NOT DETECTED Final   Escherichia coli NOT DETECTED NOT DETECTED Final   Klebsiella aerogenes NOT DETECTED NOT DETECTED Final   Klebsiella oxytoca NOT DETECTED NOT DETECTED Final   Klebsiella pneumoniae NOT DETECTED NOT DETECTED Final   Proteus species NOT DETECTED NOT DETECTED Final   Salmonella species NOT DETECTED NOT DETECTED Final   Serratia marcescens NOT DETECTED  NOT DETECTED Final   Haemophilus influenzae NOT DETECTED NOT DETECTED Final   Neisseria meningitidis NOT DETECTED NOT DETECTED Final   Pseudomonas aeruginosa NOT DETECTED NOT DETECTED Final   Stenotrophomonas maltophilia NOT DETECTED NOT DETECTED Final   Candida albicans NOT DETECTED NOT DETECTED Final   Candida auris NOT DETECTED NOT DETECTED Final   Candida glabrata NOT DETECTED NOT DETECTED Final   Candida krusei NOT DETECTED NOT DETECTED Final   Candida parapsilosis NOT DETECTED NOT DETECTED Final   Candida tropicalis NOT DETECTED NOT DETECTED Final   Cryptococcus neoformans/gattii NOT DETECTED NOT DETECTED Final   Methicillin resistance mecA/C DETECTED (A) NOT DETECTED Final    Comment: CRITICAL RESULT CALLED TO, READ BACK BY AND VERIFIED WITH: RN BRANDI MINARD ON 08/02/22 @ 1853 BY DRT Performed at [facility], [address] [place], [place] [address]     Labs: CBC: Recent Labs  Lab 08/06/22 1515 08/07/22 0515 08/08/22 0425 08/09/22 0511 08/10/22 0602  WBC 9.6 9.9 9.8 9.2 9.4  HGB 8.6* 8.4* 8.3* 8.7* 8.2*  HCT 28.2* 27.3* 26.8* 28.1* 25.6*  MCV 102.9* 100.4* 100.0 99.3 98.8  PLT 317 357 355 341 010   Basic Metabolic Panel: Recent Labs  Lab 08/05/22 0507 08/06/22 0430 08/06/22 1515 08/07/22 0515 08/08/22 0425 08/09/22 0511  NA 147* 150* 147* 149* 146*  147* 141  K 5.0 4.7 4.5 4.2 4.5  4.5 4.0  CL 107 110 109 112* 107  107 103  CO2 28 28 21* 27 29  30 30   GLUCOSE 198* 174* 357* 201* 157*  158* 146*  BUN 132* 128* 149* 114* 96*  96* 62*  CREATININE 5.02* 4.88* 4.52* 4.00* 3.11*  3.05* 2.66*  CALCIUM 9.5 10.1 9.7 9.8 9.4  9.6 9.3  PHOS 4.9* 4.7* 3.7 3.6 3.7  --    Liver Function Tests: Recent Labs  Lab 08/04/22 0528 08/05/22 0507 08/06/22 0430 08/06/22 1515 08/07/22 0515 08/08/22 0425  AST 14*  --   --   --   --   --   ALT 12  --   --   --   --   --   ALKPHOS 54  --   --   --   --   --   BILITOT 0.6  --   --   --   --   --   PROT 6.9   --   --   --   --   --  ALBUMIN 2.7* 2.7* 2.9* 3.1* 2.9* 2.9*   CBG: Recent Labs  Lab 08/09/22 0710 08/09/22 1102 08/09/22 1604 08/09/22 2137 08/10/22 0719  GLUCAP 146* 257* 281* 246* 143*    Discharge time spent: greater than 30 minutes.  Signed: Deatra James, MD [facility] 08/10/2022

## 2022-08-12 ENCOUNTER — Ambulatory Visit: Payer: Medicare Other | Admitting: Gastroenterology

## 2022-08-12 DIAGNOSIS — I509 Heart failure, unspecified: Secondary | ICD-10-CM | POA: Diagnosis not present

## 2022-08-12 DIAGNOSIS — Z992 Dependence on renal dialysis: Secondary | ICD-10-CM | POA: Diagnosis not present

## 2022-08-12 DIAGNOSIS — N186 End stage renal disease: Secondary | ICD-10-CM | POA: Diagnosis not present

## 2022-08-12 DIAGNOSIS — A499 Bacterial infection, unspecified: Secondary | ICD-10-CM | POA: Diagnosis not present

## 2022-08-12 DIAGNOSIS — R52 Pain, unspecified: Secondary | ICD-10-CM | POA: Diagnosis not present

## 2022-08-12 DIAGNOSIS — N390 Urinary tract infection, site not specified: Secondary | ICD-10-CM | POA: Diagnosis not present

## 2022-08-12 DIAGNOSIS — I480 Paroxysmal atrial fibrillation: Secondary | ICD-10-CM | POA: Diagnosis not present

## 2022-08-12 DIAGNOSIS — D689 Coagulation defect, unspecified: Secondary | ICD-10-CM | POA: Diagnosis not present

## 2022-08-12 DIAGNOSIS — Z23 Encounter for immunization: Secondary | ICD-10-CM | POA: Diagnosis not present

## 2022-08-12 DIAGNOSIS — N2581 Secondary hyperparathyroidism of renal origin: Secondary | ICD-10-CM | POA: Diagnosis not present

## 2022-08-12 DIAGNOSIS — D631 Anemia in chronic kidney disease: Secondary | ICD-10-CM | POA: Diagnosis not present

## 2022-08-12 DIAGNOSIS — I160 Hypertensive urgency: Secondary | ICD-10-CM | POA: Diagnosis not present

## 2022-08-12 DIAGNOSIS — G894 Chronic pain syndrome: Secondary | ICD-10-CM | POA: Diagnosis not present

## 2022-08-12 DIAGNOSIS — J9601 Acute respiratory failure with hypoxia: Secondary | ICD-10-CM | POA: Diagnosis not present

## 2022-08-12 NOTE — Progress Notes (Deleted)
GI Office Note    Referring Provider: Redmond School, MD Primary Care Physician:  Redmond School, MD Primary Gastroenterologist: Dr. Gala Romney  Date:  08/12/2022  ID:  [name], DOB Aug 01, 1946, MRN [id number]   Chief Complaint   No chief complaint on file.   History of Present Illness  [name] is a 76 y.o. male with a history of chronic GERD, constipation, abdominal pain, anxiety, BPH, CAD with NSTEMI in 2013 s/p stent placement, anxiety, depression, diabetes, diastolic heart failure, HLD, HTN, colon polyps, and CKD that is now progressed to ESRD recently started on dialysis 08/07/22*** presenting today for evaluation of anemia.  Last colonoscopy in August 2021: Single 6 mm tubular adenoma in the descending colon, nonbleeding internal hemorrhoids, rectal lipoma.  Recommended no further colonoscopy unless new symptoms developed.  Last seen in the office 06/29/2021 for follow-up of GERD and constipation.  Patient previously tried Linzess with perceived improvement but not significant improvement.  He was recently started on Movantik 12.5 g daily reported that this was helping him with bowel habits although still bloated.  Reported a bowel movement at least every other day not going more than 2 days without bowel movement.  Denied any rectal bleeding.  He had prior work-up for abdominal pain with CT A/P in November 2021 without acute findings and CTA in January 2022 with mesenteric arterial disease present but without significant evidence of mesenteric ischemia (greatest degree of narrowing was estimated at 50% of proximal SMA).  He underwent EGD in February 2022 with normal esophagus, erythema in the stomach, normal duodenum.  Benign gastric mucosa on biopsy.  Reflux was controlled on pantoprazole 40 mg twice daily.  He is on chronic opioid therapy and had lost 10 pounds since his prior visit.  Was taking Gas-X sporadically as well as Bentyl.  He was advised to continue Movantik daily,  take MiraLAX 1 capful at bedtime, and stop Bentyl. Continue PPI and use Gas-ex as needed.   He was recently hospitalized from 8/6 to 8/19.  He presented with shortness of breath and chest pain initially and had elevated BNP to 651 and slightly elevated troponin.  He had CXR with evidence of vascular congestion and was given Lasix, IV potassium, magnesium.  Within 24 hours he had decompensated event and was placed on BiPAP and transferred to the stepdown/ICU and had further decompensation was intubated late on 8/7 or early 8/8 after failed BiPAP trial.  He also had brief episode of A-fib shortly after converted to normal sinus rhythm.  Her blood cultures revealing MRSA and urine culture revealing Pseudomonas and was treated with IV antibiotics.  On 8/14 patient began getting worse, became mildly confused and developed anasarca.  Nephrology was consulted and recommended patient start hemodialysis.  He had palliative care consult while inpatient as well.  HD cath was placed on 8/15 and underwent hemodialysis on 08/07/2022.  Patient reported abdominal discomfort and a bowel movement on 8/17.  He had KUB with nonobstructive bowel gas pattern and moderate colonic stool burden.  He was given laxative and enema and had a bowel movement with improved abdominal pain.  He had MiraLAX and mag citrate ordered as well although patient refused this.  He had difficulty ambulating which likely further exacerbated his constipation as well as his chronic opioid use.  While inpatient his hemoglobin dropped to 7.1 and he received 2 units of PRBCs on 8/13.  He become symptomatic when his hemoglobin gets less than 8.  He was discharged  8/19 to rehab at [facility] and will be receiving outpatient hemodialysis on a likely Monday Wednesday Friday schedule.   Today:   Current Outpatient Medications  Medication Sig Dispense Refill   acarbose (PRECOSE) 50 MG tablet Take 50 mg by mouth in the morning, at noon, and at bedtime.       acetaminophen (TYLENOL) 500 MG tablet Take 2 tablets (1,000 mg total) by mouth every 6 (six) hours. 30 tablet 0   allopurinol (ZYLOPRIM) 100 MG tablet Take 100 mg by mouth daily.     amLODipine (NORVASC) 10 MG tablet Take 10 mg by mouth every evening.      aspirin EC 81 MG tablet Take 1 tablet (81 mg total) by mouth daily. 90 tablet 3   calcitRIOL (ROCALTROL) 0.25 MCG capsule Take 0.25 mcg by mouth every Sunday.      carboxymethylcellulose (REFRESH PLUS) 0.5 % SOLN Place 1 drop into both eyes daily as needed (dry eyes).     carvedilol (COREG) 6.25 MG tablet Take 6.25 mg by mouth 2 (two) times daily with a meal.     cholecalciferol (VITAMIN D) 1000 units tablet Take 1,000 Units daily by mouth.     cloNIDine (CATAPRES) 0.1 MG tablet Take 0.1 mg by mouth 3 (three) times daily.     epoetin alfa (EPOGEN) 3000 UNIT/ML injection Inject into the skin.     hydrALAZINE (APRESOLINE) 100 MG tablet Take 200 mg by mouth 2 (two) times daily.     insulin NPH Human (NOVOLIN N) 100 UNIT/ML injection Inject 0.18 mLs (18 Units total) into the skin every morning. Ands syringes 1/day (Patient taking differently: Inject 20 Units into the skin daily as needed (if CBG is high). Per sliding scale: not provided) 10 mL 11   isosorbide mononitrate (IMDUR) 60 MG 24 hr tablet Take 1 tablet (60 mg total) by mouth 2 (two) times daily. 180 tablet 3   mupirocin ointment (BACTROBAN) 2 % Apply 1 Application topically 3 (three) times daily.     nitroGLYCERIN (NITROSTAT) 0.4 MG SL tablet Place 1 tablet (0.4 mg total) under the tongue every 5 (five) minutes as needed for chest pain. PLACE ONE TABLET UNDER THE TONGUE EVERY 5 MINUTES FOR 3 DOSES AS NEEDED Strength: 0.4 mg 25 tablet 3   polyethylene glycol (MIRALAX / GLYCOLAX) 17 g packet Take 17 g by mouth daily. 14 each 0   pravastatin (PRAVACHOL) 40 MG tablet Take 1 tablet (40 mg total) by mouth every evening. 90 tablet 3   RESTASIS 0.05 % ophthalmic emulsion 1 drop 2 (two) times daily.      senna (SENOKOT) 8.6 MG TABS tablet Take 2 tablets (17.2 mg total) by mouth 2 (two) times daily. 120 tablet 0   sitaGLIPtin (JANUVIA) 25 MG tablet      TRUE METRIX BLOOD GLUCOSE TEST test strip USE TO TEST BLOOD SUGAR UP TO TWICE DAILY OR AS DIRECTED 180 each 3   No current facility-administered medications for this visit.    Past Medical History:  Diagnosis Date   Anginal pain ([place])    Anxiety    Benign prostatic hypertrophy    Nocturia   CAD (coronary artery disease)    a.  NSTEMI 10/2012 s/p DES to LAD & DES to 1st diagonal with residual diffuse nonobstructive dz in LCx/RCA    Chronic kidney disease, stage 3, mod decreased GFR (HCC)    Creatinine of 1.5 in 03/2009, STAGE 4 now   Degenerative joint disease    Depression  hx of   Diabetes mellitus, type II ([place]) 78/29/5621   Diastolic heart failure    Pedal edema, "a long time ago"   Erectile dysfunction    Flu 2013   hx of   GERD (gastroesophageal reflux disease)    Gout    Hemorrhoids    High cholesterol    Hypertension    Exercise induced   Myocardial infarction ([place])    Neuropathy    bilateral legs   NSVT (nonsustained ventricular tachycardia) ([place])    Exercise induced   Obesity    Tobacco abuse, 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 Oneida Alar- TCS= left sided colitis and mild proctitis likely due to ischemic colitis, moderate internal hemorrhoids, small nodule in the rectum, bx=tubular adenoma, EGD=-mild non-erosive gastritis   COLONOSCOPY WITH PROPOFOL N/A 07/24/2020   hemorrhoids,  rectosigmoid lipoma, small adenoma removed.   CORONARY  ANGIOPLASTY     ESOPHAGOGASTRODUODENOSCOPY (EGD) WITH PROPOFOL N/A 02/05/2021   Normal esophagus, erythematous mucosa in entire stomach, no obvious ulcer, s/p biopsy. Normal duodenum. Negative H.pylori.    HERNIA REPAIR     INSERTION OF DIALYSIS CATHETER N/A 08/06/2022   Procedure: INSERTION OF DIALYSIS CATHETER;  Surgeon: Rosetta Posner, MD;  Location: [facility];  Service: Vascular;  Laterality: N/A;   JOINT REPLACEMENT     LEFT HEART CATHETERIZATION WITH CORONARY ANGIOGRAM N/A 11/16/2012   Procedure: LEFT HEART CATHETERIZATION WITH CORONARY ANGIOGRAM;  Surgeon: Sherren Mocha, MD;  Location: [facility] CATH LAB;  Service: Cardiovascular;  Laterality: N/A;   PERCUTANEOUS CORONARY STENT INTERVENTION (PCI-S)  11/16/2012   Procedure: PERCUTANEOUS CORONARY STENT INTERVENTION (PCI-S);  Surgeon: Sherren Mocha, MD;  Location: [facility] CATH LAB;  Service: Cardiovascular;;   POLYPECTOMY  07/24/2020   Procedure: POLYPECTOMY;  Surgeon: Daneil Dolin, MD;  [facility]: [facility];  Service: Endoscopy;;   SPINAL CORD STIMULATOR INSERTION N/A 06/10/2022   Procedure: Spinal cord stimulator placement;  Surgeon: Newman Pies, MD;  Location: [facility];  Service: Neurosurgery;  Laterality: N/A;  RM 19/ to follow   THUMB AMPUTATION Left    50 years ago   TOTAL KNEE ARTHROPLASTY Right 11/15/2013   Procedure: RIGHT TOTAL KNEE ARTHROPLASTY;  Surgeon: Mauri Pole, MD;  Location: [facility];  Service: Orthopedics;  Laterality: Right;   TOTAL KNEE ARTHROPLASTY Left 03/07/2015   Procedure: LEFT TOTAL KNEE ARTHROPLASTY;  Surgeon: Paralee Cancel, MD;  Location: [facility];  Service: Orthopedics;  Laterality: Left;    Family History  Problem Relation Age of Onset   Hypertension Other    Cancer Other    Heart disease Other    Diabetes Other    Hypertension Mother    Cancer Father        brain   CAD Brother    Colon cancer Neg Hx     Allergies as of 08/12/2022 - Review Complete 08/06/2022  Allergen Reaction Noted   Metformin Nausea  And Vomiting 02/06/2022   Niacin  09/18/2021   Niacin and related Other (See Comments) 10/24/2012    Social History  Socioeconomic History   Marital status: Widowed    Spouse name: Not on file   Number of children: Not on file   Years of education: Not on file   Highest education level: Not on file  Occupational History   Occupation: Disabled    Employer: RETIRED  Tobacco Use   Smoking status: Former    Packs/day: 1.00    Years: 30.00    Total pack years: 30.00    Types: Cigarettes    Start date: 01/22/1965    Quit date: 12/23/1992    Years since quitting: 29.6   Smokeless tobacco: Never  Vaping Use   Vaping Use: Never used  Substance and Sexual Activity   Alcohol use: No    Alcohol/week: 0.0 standard drinks of alcohol   Drug use: No   Sexual activity: Not Currently  Other Topics Concern   Not on file  Social History Narrative   Married   Right handed   One story home no caffeine   Social Determinants of Health   Financial Resource Strain: Not on file  Food Insecurity: Not on file  Transportation Needs: Not on file  Physical Activity: Not on file  Stress: Not on file  Social Connections: Not on file     Review of Systems   Gen: Denies fever, chills, anorexia. Denies fatigue, weakness, weight loss.  CV: Denies chest pain, palpitations, syncope, peripheral edema, and claudication. Resp: Denies dyspnea at rest, cough, wheezing, coughing up blood, and pleurisy. GI: See HPI Derm: Denies rash, itching, dry skin Psych: Denies depression, anxiety, memory loss, confusion. No homicidal or suicidal ideation.  Heme: Denies bruising, bleeding, and enlarged lymph nodes.   Physical Exam   There were no vitals taken for this visit.  General:   Alert and oriented. No distress noted. Pleasant and cooperative.  Head:  Normocephalic and atraumatic. Eyes:  Conjuctiva clear without scleral icterus. Mouth:  Oral mucosa pink and moist. Good dentition. No lesions. Lungs:   Clear to auscultation bilaterally. No wheezes, rales, or rhonchi. No distress.  Heart:  S1, S2 present without murmurs appreciated.  Abdomen:  +BS, soft, non-tender and non-distended. No rebound or guarding. No HSM or masses noted. Rectal: *** Msk:  Symmetrical without gross deformities. Normal posture. Extremities:  Without edema. Neurologic:  Alert and  oriented x4 Psych:  Alert and cooperative. Normal mood and affect.   Assessment  [name] is a 76 y.o. male with a history of *** presenting today with   Anemia:   PLAN   ***     Venetia Night, MSN, FNP-BC, AGACNP-BC [facility]

## 2022-08-14 DIAGNOSIS — N2581 Secondary hyperparathyroidism of renal origin: Secondary | ICD-10-CM | POA: Diagnosis not present

## 2022-08-14 DIAGNOSIS — I10 Essential (primary) hypertension: Secondary | ICD-10-CM | POA: Diagnosis not present

## 2022-08-14 DIAGNOSIS — G894 Chronic pain syndrome: Secondary | ICD-10-CM | POA: Diagnosis not present

## 2022-08-14 DIAGNOSIS — Z992 Dependence on renal dialysis: Secondary | ICD-10-CM | POA: Diagnosis not present

## 2022-08-14 DIAGNOSIS — D631 Anemia in chronic kidney disease: Secondary | ICD-10-CM | POA: Diagnosis not present

## 2022-08-14 DIAGNOSIS — I480 Paroxysmal atrial fibrillation: Secondary | ICD-10-CM | POA: Diagnosis not present

## 2022-08-14 DIAGNOSIS — N390 Urinary tract infection, site not specified: Secondary | ICD-10-CM | POA: Diagnosis not present

## 2022-08-14 DIAGNOSIS — Z23 Encounter for immunization: Secondary | ICD-10-CM | POA: Diagnosis not present

## 2022-08-14 DIAGNOSIS — N189 Chronic kidney disease, unspecified: Secondary | ICD-10-CM | POA: Diagnosis not present

## 2022-08-14 DIAGNOSIS — D689 Coagulation defect, unspecified: Secondary | ICD-10-CM | POA: Diagnosis not present

## 2022-08-14 DIAGNOSIS — J9601 Acute respiratory failure with hypoxia: Secondary | ICD-10-CM | POA: Diagnosis not present

## 2022-08-14 DIAGNOSIS — N186 End stage renal disease: Secondary | ICD-10-CM | POA: Diagnosis not present

## 2022-08-14 DIAGNOSIS — R52 Pain, unspecified: Secondary | ICD-10-CM | POA: Diagnosis not present

## 2022-08-14 DIAGNOSIS — I5020 Unspecified systolic (congestive) heart failure: Secondary | ICD-10-CM | POA: Diagnosis not present

## 2022-08-14 DIAGNOSIS — E1122 Type 2 diabetes mellitus with diabetic chronic kidney disease: Secondary | ICD-10-CM | POA: Diagnosis not present

## 2022-08-15 DIAGNOSIS — A499 Bacterial infection, unspecified: Secondary | ICD-10-CM | POA: Diagnosis not present

## 2022-08-15 DIAGNOSIS — I509 Heart failure, unspecified: Secondary | ICD-10-CM | POA: Diagnosis not present

## 2022-08-15 DIAGNOSIS — G894 Chronic pain syndrome: Secondary | ICD-10-CM | POA: Diagnosis not present

## 2022-08-15 DIAGNOSIS — I480 Paroxysmal atrial fibrillation: Secondary | ICD-10-CM | POA: Diagnosis not present

## 2022-08-15 DIAGNOSIS — J9601 Acute respiratory failure with hypoxia: Secondary | ICD-10-CM | POA: Diagnosis not present

## 2022-08-15 DIAGNOSIS — N186 End stage renal disease: Secondary | ICD-10-CM | POA: Diagnosis not present

## 2022-08-15 DIAGNOSIS — I160 Hypertensive urgency: Secondary | ICD-10-CM | POA: Diagnosis not present

## 2022-08-15 DIAGNOSIS — N390 Urinary tract infection, site not specified: Secondary | ICD-10-CM | POA: Diagnosis not present

## 2022-08-16 DIAGNOSIS — Z992 Dependence on renal dialysis: Secondary | ICD-10-CM | POA: Diagnosis not present

## 2022-08-16 DIAGNOSIS — R52 Pain, unspecified: Secondary | ICD-10-CM | POA: Diagnosis not present

## 2022-08-16 DIAGNOSIS — N2581 Secondary hyperparathyroidism of renal origin: Secondary | ICD-10-CM | POA: Diagnosis not present

## 2022-08-16 DIAGNOSIS — D631 Anemia in chronic kidney disease: Secondary | ICD-10-CM | POA: Diagnosis not present

## 2022-08-16 DIAGNOSIS — D689 Coagulation defect, unspecified: Secondary | ICD-10-CM | POA: Diagnosis not present

## 2022-08-16 DIAGNOSIS — Z23 Encounter for immunization: Secondary | ICD-10-CM | POA: Diagnosis not present

## 2022-08-16 DIAGNOSIS — N186 End stage renal disease: Secondary | ICD-10-CM | POA: Diagnosis not present

## 2022-08-19 ENCOUNTER — Other Ambulatory Visit: Payer: Self-pay

## 2022-08-19 ENCOUNTER — Emergency Department (HOSPITAL_COMMUNITY): Payer: Medicare Other

## 2022-08-19 ENCOUNTER — Inpatient Hospital Stay (HOSPITAL_COMMUNITY)
Admission: EM | Admit: 2022-08-19 | Discharge: 2022-08-23 | DRG: 871 | Disposition: A | Discharge status: Skilled Nursing Facility | Payer: Medicare Other | Source: Skilled Nursing Facility | Attending: Internal Medicine | Admitting: Internal Medicine

## 2022-08-19 ENCOUNTER — Encounter (HOSPITAL_COMMUNITY): Payer: Self-pay | Admitting: Emergency Medicine

## 2022-08-19 ENCOUNTER — Encounter (HOSPITAL_COMMUNITY): Payer: Self-pay | Admitting: Hematology

## 2022-08-19 DIAGNOSIS — Z7401 Bed confinement status: Secondary | ICD-10-CM | POA: Diagnosis not present

## 2022-08-19 DIAGNOSIS — M2550 Pain in unspecified joint: Secondary | ICD-10-CM | POA: Diagnosis not present

## 2022-08-19 DIAGNOSIS — I10 Essential (primary) hypertension: Secondary | ICD-10-CM | POA: Diagnosis present

## 2022-08-19 DIAGNOSIS — N186 End stage renal disease: Secondary | ICD-10-CM | POA: Diagnosis present

## 2022-08-19 DIAGNOSIS — M898X9 Other specified disorders of bone, unspecified site: Secondary | ICD-10-CM | POA: Diagnosis present

## 2022-08-19 DIAGNOSIS — F1120 Opioid dependence, uncomplicated: Secondary | ICD-10-CM | POA: Diagnosis present

## 2022-08-19 DIAGNOSIS — Z9861 Coronary angioplasty status: Secondary | ICD-10-CM | POA: Diagnosis not present

## 2022-08-19 DIAGNOSIS — I493 Ventricular premature depolarization: Secondary | ICD-10-CM | POA: Diagnosis present

## 2022-08-19 DIAGNOSIS — K8050 Calculus of bile duct without cholangitis or cholecystitis without obstruction: Secondary | ICD-10-CM | POA: Diagnosis not present

## 2022-08-19 DIAGNOSIS — I700 Atherosclerosis of aorta: Secondary | ICD-10-CM | POA: Diagnosis not present

## 2022-08-19 DIAGNOSIS — Z79899 Other long term (current) drug therapy: Secondary | ICD-10-CM

## 2022-08-19 DIAGNOSIS — K5900 Constipation, unspecified: Secondary | ICD-10-CM | POA: Diagnosis present

## 2022-08-19 DIAGNOSIS — E782 Mixed hyperlipidemia: Secondary | ICD-10-CM | POA: Diagnosis present

## 2022-08-19 DIAGNOSIS — M549 Dorsalgia, unspecified: Secondary | ICD-10-CM | POA: Diagnosis present

## 2022-08-19 DIAGNOSIS — Z794 Long term (current) use of insulin: Secondary | ICD-10-CM

## 2022-08-19 DIAGNOSIS — E669 Obesity, unspecified: Secondary | ICD-10-CM | POA: Diagnosis present

## 2022-08-19 DIAGNOSIS — I252 Old myocardial infarction: Secondary | ICD-10-CM | POA: Diagnosis not present

## 2022-08-19 DIAGNOSIS — R1084 Generalized abdominal pain: Secondary | ICD-10-CM | POA: Diagnosis not present

## 2022-08-19 DIAGNOSIS — Z833 Family history of diabetes mellitus: Secondary | ICD-10-CM

## 2022-08-19 DIAGNOSIS — N250 Renal osteodystrophy: Secondary | ICD-10-CM | POA: Diagnosis not present

## 2022-08-19 DIAGNOSIS — E785 Hyperlipidemia, unspecified: Secondary | ICD-10-CM | POA: Diagnosis not present

## 2022-08-19 DIAGNOSIS — E1122 Type 2 diabetes mellitus with diabetic chronic kidney disease: Secondary | ICD-10-CM | POA: Diagnosis present

## 2022-08-19 DIAGNOSIS — I132 Hypertensive heart and chronic kidney disease with heart failure and with stage 5 chronic kidney disease, or end stage renal disease: Secondary | ICD-10-CM | POA: Diagnosis present

## 2022-08-19 DIAGNOSIS — Z7982 Long term (current) use of aspirin: Secondary | ICD-10-CM

## 2022-08-19 DIAGNOSIS — Z888 Allergy status to other drugs, medicaments and biological substances status: Secondary | ICD-10-CM

## 2022-08-19 DIAGNOSIS — G8929 Other chronic pain: Secondary | ICD-10-CM | POA: Diagnosis present

## 2022-08-19 DIAGNOSIS — E1142 Type 2 diabetes mellitus with diabetic polyneuropathy: Secondary | ICD-10-CM | POA: Diagnosis present

## 2022-08-19 DIAGNOSIS — F419 Anxiety disorder, unspecified: Secondary | ICD-10-CM | POA: Diagnosis present

## 2022-08-19 DIAGNOSIS — N179 Acute kidney failure, unspecified: Secondary | ICD-10-CM | POA: Diagnosis not present

## 2022-08-19 DIAGNOSIS — Z955 Presence of coronary angioplasty implant and graft: Secondary | ICD-10-CM

## 2022-08-19 DIAGNOSIS — I5030 Unspecified diastolic (congestive) heart failure: Secondary | ICD-10-CM | POA: Diagnosis not present

## 2022-08-19 DIAGNOSIS — D649 Anemia, unspecified: Secondary | ICD-10-CM | POA: Diagnosis present

## 2022-08-19 DIAGNOSIS — R140 Abdominal distension (gaseous): Secondary | ICD-10-CM | POA: Diagnosis not present

## 2022-08-19 DIAGNOSIS — D631 Anemia in chronic kidney disease: Secondary | ICD-10-CM | POA: Diagnosis present

## 2022-08-19 DIAGNOSIS — Z934 Other artificial openings of gastrointestinal tract status: Secondary | ICD-10-CM | POA: Diagnosis not present

## 2022-08-19 DIAGNOSIS — K219 Gastro-esophageal reflux disease without esophagitis: Secondary | ICD-10-CM | POA: Diagnosis present

## 2022-08-19 DIAGNOSIS — M545 Low back pain, unspecified: Secondary | ICD-10-CM | POA: Diagnosis not present

## 2022-08-19 DIAGNOSIS — N400 Enlarged prostate without lower urinary tract symptoms: Secondary | ICD-10-CM | POA: Diagnosis present

## 2022-08-19 DIAGNOSIS — M6281 Muscle weakness (generalized): Secondary | ICD-10-CM | POA: Diagnosis not present

## 2022-08-19 DIAGNOSIS — Z6833 Body mass index (BMI) 33.0-33.9, adult: Secondary | ICD-10-CM

## 2022-08-19 DIAGNOSIS — A419 Sepsis, unspecified organism: Principal | ICD-10-CM | POA: Diagnosis present

## 2022-08-19 DIAGNOSIS — Z96653 Presence of artificial knee joint, bilateral: Secondary | ICD-10-CM | POA: Diagnosis present

## 2022-08-19 DIAGNOSIS — I2510 Atherosclerotic heart disease of native coronary artery without angina pectoris: Secondary | ICD-10-CM | POA: Diagnosis not present

## 2022-08-19 DIAGNOSIS — Z992 Dependence on renal dialysis: Secondary | ICD-10-CM | POA: Diagnosis not present

## 2022-08-19 DIAGNOSIS — I959 Hypotension, unspecified: Secondary | ICD-10-CM | POA: Diagnosis present

## 2022-08-19 DIAGNOSIS — R7989 Other specified abnormal findings of blood chemistry: Secondary | ICD-10-CM | POA: Diagnosis present

## 2022-08-19 DIAGNOSIS — R279 Unspecified lack of coordination: Secondary | ICD-10-CM | POA: Diagnosis not present

## 2022-08-19 DIAGNOSIS — R6520 Severe sepsis without septic shock: Secondary | ICD-10-CM | POA: Diagnosis not present

## 2022-08-19 DIAGNOSIS — K8020 Calculus of gallbladder without cholecystitis without obstruction: Secondary | ICD-10-CM | POA: Diagnosis not present

## 2022-08-19 DIAGNOSIS — K811 Chronic cholecystitis: Secondary | ICD-10-CM | POA: Diagnosis present

## 2022-08-19 DIAGNOSIS — E1169 Type 2 diabetes mellitus with other specified complication: Secondary | ICD-10-CM | POA: Diagnosis not present

## 2022-08-19 DIAGNOSIS — K810 Acute cholecystitis: Secondary | ICD-10-CM | POA: Diagnosis not present

## 2022-08-19 DIAGNOSIS — I480 Paroxysmal atrial fibrillation: Secondary | ICD-10-CM | POA: Diagnosis present

## 2022-08-19 DIAGNOSIS — I5032 Chronic diastolic (congestive) heart failure: Secondary | ICD-10-CM | POA: Diagnosis not present

## 2022-08-19 DIAGNOSIS — I5042 Chronic combined systolic (congestive) and diastolic (congestive) heart failure: Secondary | ICD-10-CM | POA: Diagnosis present

## 2022-08-19 DIAGNOSIS — K8000 Calculus of gallbladder with acute cholecystitis without obstruction: Secondary | ICD-10-CM | POA: Diagnosis present

## 2022-08-19 DIAGNOSIS — N2581 Secondary hyperparathyroidism of renal origin: Secondary | ICD-10-CM | POA: Diagnosis not present

## 2022-08-19 DIAGNOSIS — R262 Difficulty in walking, not elsewhere classified: Secondary | ICD-10-CM | POA: Diagnosis not present

## 2022-08-19 DIAGNOSIS — M5136 Other intervertebral disc degeneration, lumbar region: Secondary | ICD-10-CM | POA: Diagnosis not present

## 2022-08-19 DIAGNOSIS — R7401 Elevation of levels of liver transaminase levels: Secondary | ICD-10-CM | POA: Diagnosis present

## 2022-08-19 DIAGNOSIS — R109 Unspecified abdominal pain: Secondary | ICD-10-CM | POA: Diagnosis not present

## 2022-08-19 DIAGNOSIS — M1A9XX1 Chronic gout, unspecified, 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ophi): Secondary | ICD-10-CM | POA: Diagnosis present

## 2022-08-19 DIAGNOSIS — Z808 Family history of malignant neoplasm of other organs or systems: Secondary | ICD-10-CM

## 2022-08-19 DIAGNOSIS — Z87891 Personal history of nicotine dependence: Secondary | ICD-10-CM

## 2022-08-19 DIAGNOSIS — Z8249 Family history of ischemic heart disease and other diseases of the circulatory system: Secondary | ICD-10-CM

## 2022-08-19 DIAGNOSIS — R627 Adult failure to thrive: Secondary | ICD-10-CM | POA: Diagnosis present

## 2022-08-19 LAB — COMPREHENSIVE METABOLIC PANEL
ALT: 273 U/L — ABNORMAL HIGH (ref 0–44)
AST: 711 U/L — ABNORMAL HIGH (ref 15–41)
Albumin: 3.3 g/dL — ABNORMAL LOW (ref 3.5–5.0)
Alkaline Phosphatase: 613 U/L — ABNORMAL HIGH (ref 38–126)
Anion gap: 16 — ABNORMAL HIGH (ref 5–15)
BUN: 63 mg/dL — ABNORMAL HIGH (ref 8–23)
CO2: 27 mmol/L (ref 22–32)
Calcium: 9.2 mg/dL (ref 8.9–10.3)
Chloride: 92 mmol/L — ABNORMAL LOW (ref 98–111)
Creatinine, Ser: 4.9 mg/dL — ABNORMAL HIGH (ref 0.61–1.24)
GFR, Estimated: 12 mL/min — ABNORMAL LOW (ref >60)
Glucose, Bld: 133 mg/dL — ABNORMAL HIGH (ref 70–99)
Potassium: 4.3 mmol/L (ref 3.5–5.1)
Sodium: 135 mmol/L (ref 135–145)
Total Bilirubin: 3.3 mg/dL — ABNORMAL HIGH (ref 0.3–1.2)
Total Protein: 8.2 g/dL — ABNORMAL HIGH (ref 6.5–8.1)

## 2022-08-19 LAB — CBC WITH DIFFERENTIAL/PLATELET
Abs Immature Granulocytes: 0.34 10*3/uL — ABNORMAL HIGH (ref 0.00–0.07)
Basophils Absolute: 0.1 10*3/uL (ref 0.0–0.1)
Basophils Relative: 0 %
Eosinophils Absolute: 0.2 10*3/uL (ref 0.0–0.5)
Eosinophils Relative: 1 %
HCT: 28.2 % — ABNORMAL LOW (ref 39.0–52.0)
Hemoglobin: 9.1 g/dL — ABNORMAL LOW (ref 13.0–17.0)
Immature Granulocytes: 1 %
Lymphocytes Relative: 3 %
Lymphs Abs: 0.8 10*3/uL (ref 0.7–4.0)
MCH: 31.8 pg (ref 26.0–34.0)
MCHC: 32.3 g/dL (ref 30.0–36.0)
MCV: 98.6 fL (ref 80.0–100.0)
Monocytes Absolute: 2.5 10*3/uL — ABNORMAL HIGH (ref 0.1–1.0)
Monocytes Relative: 10 %
Neutro Abs: 20.9 10*3/uL — ABNORMAL HIGH (ref 1.7–7.7)
Neutrophils Relative %: 85 %
Platelets: 215 10*3/uL (ref 150–400)
RBC: 2.86 MIL/uL — ABNORMAL LOW (ref 4.22–5.81)
RDW: 16.4 % — ABNORMAL HIGH (ref 11.5–15.5)
WBC: 24.9 10*3/uL — ABNORMAL HIGH (ref 4.0–10.5)
nRBC: 0 % (ref 0.0–0.2)

## 2022-08-19 LAB — LACTIC ACID, PLASMA
Lactic Acid, Venous: 1.6 mmol/L (ref 0.5–1.9)
Lactic Acid, Venous: 1.3 mmol/L (ref 0.5–1.9)

## 2022-08-19 LAB — HEPATITIS B CORE ANTIBODY, TOTAL: Hep B Core Total Ab: NONREACTIVE

## 2022-08-19 LAB — PROTIME-INR
INR: 1.6 — ABNORMAL HIGH (ref 0.8–1.2)
Prothrombin Time: 18.4 seconds — ABNORMAL HIGH (ref 11.4–15.2)

## 2022-08-19 LAB — HEPATITIS C ANTIBODY: HCV Ab: NONREACTIVE

## 2022-08-19 LAB — LIPASE, BLOOD: Lipase: 925 U/L — ABNORMAL HIGH (ref 11–51)

## 2022-08-19 LAB — HEPATITIS B SURFACE ANTIGEN: Hepatitis B Surface Ag: NONREACTIVE

## 2022-08-19 LAB — HEPATITIS B SURFACE ANTIBODY,QUALITATIVE: Hep B S Ab: NONREACTIVE

## 2022-08-19 LAB — APTT: aPTT: 38 seconds — ABNORMAL HIGH (ref 24–36)

## 2022-08-19 MED ORDER — NITROGLYCERIN 0.4 MG SL SUBL: 0.4000 mg | SUBLINGUAL_TABLET | SUBLINGUAL | Status: DC | PRN

## 2022-08-19 MED ORDER — PIPERACILLIN-TAZOBACTAM IN DEX 2-0.25 GM/50ML IV SOLN
2.2500 g | Freq: Three times a day (TID) | INTRAVENOUS | Status: DC
  Filled 2022-08-19 (×3): qty 50

## 2022-08-19 MED ORDER — AMLODIPINE BESYLATE 10 MG PO TABS
10.0000 mg | ORAL_TABLET | Freq: Every evening | ORAL | Status: DC
Start: 2022-08-19 — End: 2022-08-24
  Filled 2022-08-19: qty 1

## 2022-08-19 MED ORDER — HEPARIN SODIUM (PORCINE) 5000 UNIT/ML IJ SOLN: 5000.0000 [IU] | Freq: Three times a day (TID) | INTRAMUSCULAR | Status: DC

## 2022-08-19 MED ORDER — ASPIRIN 81 MG PO TBEC: 81.0000 mg | DELAYED_RELEASE_TABLET | Freq: Every day | ORAL | Status: DC

## 2022-08-19 MED ORDER — POLYVINYL ALCOHOL 1.4 % OP SOLN: 1.0000 [drp] | Freq: Every day | OPHTHALMIC | Status: DC | PRN

## 2022-08-19 MED ORDER — CLONIDINE HCL 0.1 MG PO TABS
0.1000 mg | ORAL_TABLET | Freq: Three times a day (TID) | ORAL | Status: DC
  Administered 2022-08-19 – 2022-08-23 (×5): 0.1 mg via ORAL
  Filled 2022-08-19 (×8): qty 1

## 2022-08-19 MED ORDER — ALTEPLASE 2 MG IJ SOLR: 2.0000 mg | Freq: Once | INTRAMUSCULAR | Status: DC | PRN

## 2022-08-19 MED ORDER — HYDRALAZINE HCL 100 MG PO TABS
200.0000 mg | ORAL_TABLET | Freq: Two times a day (BID) | ORAL | Status: DC
Start: 2022-08-19 — End: 2022-08-19

## 2022-08-19 MED ORDER — SODIUM CHLORIDE 0.9% FLUSH
3.0000 mL | Freq: Two times a day (BID) | INTRAVENOUS | Status: DC
  Administered 2022-08-19 – 2022-08-23 (×9): 3 mL via INTRAVENOUS

## 2022-08-19 MED ORDER — PIPERACILLIN-TAZOBACTAM 3.375 G IVPB 30 MIN
3.3750 g | Freq: Once | INTRAVENOUS | Status: AC
  Administered 2022-08-19: 3.375 g via INTRAVENOUS

## 2022-08-19 MED ORDER — SODIUM CHLORIDE 0.9 % IV SOLN
250.0000 mL | INTRAVENOUS | Status: DC | PRN
Start: 2022-08-19 — End: 2022-08-24
  Administered 2022-08-19: 250 mL via INTRAVENOUS

## 2022-08-19 MED ORDER — FENTANYL CITRATE PF 50 MCG/ML IJ SOSY
50.0000 ug | PREFILLED_SYRINGE | Freq: Once | INTRAMUSCULAR | Status: AC
  Administered 2022-08-19: 50 ug via INTRAVENOUS
  Filled 2022-08-19: qty 1

## 2022-08-19 MED ORDER — HYDRALAZINE HCL 25 MG PO TABS
100.0000 mg | ORAL_TABLET | Freq: Three times a day (TID) | ORAL | Status: DC
  Administered 2022-08-19 – 2022-08-23 (×5): 100 mg via ORAL
  Filled 2022-08-19 (×8): qty 4

## 2022-08-19 MED ORDER — CALCITRIOL 0.25 MCG PO CAPS: 0.2500 ug | ORAL_CAPSULE | ORAL | Status: DC

## 2022-08-19 MED ORDER — SODIUM CHLORIDE 0.9 % IV BOLUS
500.0000 mL | Freq: Once | INTRAVENOUS | Status: AC
  Administered 2022-08-19: 500 mL via INTRAVENOUS

## 2022-08-19 MED ORDER — CYCLOSPORINE 0.05 % OP EMUL
1.0000 [drp] | Freq: Two times a day (BID) | OPHTHALMIC | Status: DC
Start: 2022-08-19 — End: 2022-08-24
  Administered 2022-08-19 – 2022-08-23 (×7): 1 [drp] via OPHTHALMIC
  Filled 2022-08-19 (×9): qty 30

## 2022-08-19 MED ORDER — CARVEDILOL 6.25 MG PO TABS
6.2500 mg | ORAL_TABLET | Freq: Two times a day (BID) | ORAL | Status: DC
  Administered 2022-08-20 – 2022-08-22 (×4): 6.25 mg via ORAL
  Filled 2022-08-19 (×4): qty 1

## 2022-08-19 MED ORDER — ALBUMIN HUMAN 25 % IV SOLN
25.0000 g | INTRAVENOUS | Status: DC | PRN
  Administered 2022-08-19: 25 g via INTRAVENOUS
  Filled 2022-08-19: qty 100

## 2022-08-19 MED ORDER — CHLORHEXIDINE GLUCONATE CLOTH 2 % EX PADS
6.0000 | MEDICATED_PAD | Freq: Every day | CUTANEOUS | Status: DC
  Administered 2022-08-20 – 2022-08-23 (×4): 6 via TOPICAL

## 2022-08-19 MED ORDER — SODIUM CHLORIDE 0.9 % IV SOLN
2.2500 g | Freq: Three times a day (TID) | INTRAVENOUS | Status: DC
  Administered 2022-08-19: 2.25 g via INTRAVENOUS
  Filled 2022-08-19 (×3): qty 10

## 2022-08-19 MED ORDER — HEPARIN SODIUM (PORCINE) 1000 UNIT/ML DIALYSIS
1000.0000 [IU] | INTRAMUSCULAR | Status: DC | PRN
  Administered 2022-08-19: 3800 [IU]
  Administered 2022-08-21: 4000 [IU]
  Filled 2022-08-19 (×5): qty 1

## 2022-08-19 MED ORDER — HYDROMORPHONE HCL 1 MG/ML IJ SOLN
0.5000 mg | INTRAMUSCULAR | Status: DC | PRN
  Administered 2022-08-19: 0.5 mg via INTRAVENOUS
  Administered 2022-08-19 – 2022-08-21 (×12): 1 mg via INTRAVENOUS
  Filled 2022-08-19 (×17): qty 1

## 2022-08-19 MED ORDER — ONDANSETRON HCL 4 MG/2ML IJ SOLN
4.0000 mg | Freq: Four times a day (QID) | INTRAMUSCULAR | Status: DC | PRN
  Administered 2022-08-19 – 2022-08-23 (×6): 4 mg via INTRAVENOUS
  Filled 2022-08-19 (×5): qty 2

## 2022-08-19 MED ORDER — PIPERACILLIN-TAZOBACTAM IN DEX 2-0.25 GM/50ML IV SOLN
2.2500 g | Freq: Four times a day (QID) | INTRAVENOUS | Status: DC
  Filled 2022-08-19 (×4): qty 50

## 2022-08-19 MED ORDER — ISOSORBIDE MONONITRATE ER 60 MG PO TB24
60.0000 mg | ORAL_TABLET | Freq: Two times a day (BID) | ORAL | Status: DC
  Administered 2022-08-19 – 2022-08-22 (×5): 60 mg via ORAL
  Filled 2022-08-19 (×7): qty 1

## 2022-08-19 MED ORDER — ACETAMINOPHEN 500 MG PO TABS
1000.0000 mg | ORAL_TABLET | Freq: Once | ORAL | Status: AC
  Administered 2022-08-19: 1000 mg via ORAL
  Filled 2022-08-19: qty 2

## 2022-08-19 MED ORDER — SODIUM CHLORIDE 0.9% FLUSH
3.0000 mL | INTRAVENOUS | Status: DC | PRN
Start: 2022-08-19 — End: 2022-08-24
  Administered 2022-08-19 – 2022-08-21 (×2): 3 mL via INTRAVENOUS

## 2022-08-19 MED ORDER — ONDANSETRON HCL 4 MG PO TABS: 4.0000 mg | ORAL_TABLET | Freq: Four times a day (QID) | ORAL | Status: DC | PRN

## 2022-08-19 MED ORDER — ALLOPURINOL 100 MG PO TABS
100.0000 mg | ORAL_TABLET | Freq: Every day | ORAL | Status: DC
  Administered 2022-08-20 – 2022-08-23 (×4): 100 mg via ORAL
  Filled 2022-08-19 (×4): qty 1

## 2022-08-19 NOTE — ED Provider Notes (Signed)
[facility] EMERGENCY DEPARTMENT Provider Note   CSN: [id number] Arrival date & time: 08/19/22  0938     History  No chief complaint on file.   [name] is a 76 y.o. male.  HPI   This patient is a critically ill-appearing 76 year old male presenting with hypotension, vomiting diarrhea and abdominal pain.  This patient was recently admitted to the hospital approximately a week and a half ago and discharged on 19 August, during that admission he was found to have heart failure with a preserved ejection fraction, diagnosed with end-stage renal disease and a dialysis catheter was placed for dialysis purposes.  He also had respiratory failure with hypoxia and spent some time on a ventilator.  While in the hospital he went into atrial fibrillation, required aggressive diuresis, failed BiPAP, developed anasarca and required starting hemodialysis.  It was noted that he required a laxative for constipation had a bowel movement, had less abdominal pain, received IV vancomycin but blood cultures grew staph.  The report is that the patient has been having vomiting and diarrhea for 3 days, he has not been able to hold anything down, consistently vomits, scheduled for dialysis today  Home Medications Prior to Admission medications   Medication Sig Start Date End Date Taking? Authorizing Provider  acarbose (PRECOSE) 50 MG tablet Take 50 mg by mouth in the morning, at noon, and at bedtime.  10/20/17   [provider]  acetaminophen (TYLENOL) 500 MG tablet Take 2 tablets (1,000 mg total) by mouth every 6 (six) hours. 08/10/22   Shahmehdi, Valeria Batman, MD  allopurinol (ZYLOPRIM) 100 MG tablet Take 100 mg by mouth daily. 08/17/20   [provider]  amLODipine (NORVASC) 10 MG tablet Take 10 mg by mouth every evening.     [provider]  aspirin EC 81 MG tablet Take 1 tablet (81 mg total) by mouth daily. 09/14/19   Herminio Commons, MD  calcitRIOL (ROCALTROL) 0.25 MCG capsule  Take 0.25 mcg by mouth every Sunday.  12/13/16   [provider]  carboxymethylcellulose (REFRESH PLUS) 0.5 % SOLN Place 1 drop into both eyes daily as needed (dry eyes).    [provider]  carvedilol (COREG) 6.25 MG tablet Take 6.25 mg by mouth 2 (two) times daily with a meal. 04/18/22 04/18/23  [provider]  cholecalciferol (VITAMIN D) 1000 units tablet Take 1,000 Units daily by mouth.    [provider]  cloNIDine (CATAPRES) 0.1 MG tablet Take 0.1 mg by mouth 3 (three) times daily. 02/15/20   [provider]  epoetin alfa (EPOGEN) 3000 UNIT/ML injection Inject into the skin. 07/03/22   [provider]  hydrALAZINE (APRESOLINE) 100 MG tablet Take 200 mg by mouth 2 (two) times daily. 01/24/22   [provider]  insulin NPH Human (NOVOLIN N) 100 UNIT/ML injection Inject 0.18 mLs (18 Units total) into the skin every morning. Ands syringes 1/day Patient taking differently: Inject 20 Units into the skin daily as needed (if CBG is high). Per sliding scale: not provided 06/27/21   Renato Shin, MD  isosorbide mononitrate (IMDUR) 60 MG 24 hr tablet Take 1 tablet (60 mg total) by mouth 2 (two) times daily. 06/26/22 06/21/23  Arnoldo Lenis, MD  mupirocin ointment (BACTROBAN) 2 % Apply 1 Application topically 3 (three) times daily. 07/10/22   [provider]  nitroGLYCERIN (NITROSTAT) 0.4 MG SL tablet Place 1 tablet (0.4 mg total) under the tongue every 5 (five) minutes as needed for chest  pain. PLACE ONE TABLET UNDER THE TONGUE EVERY 5 MINUTES FOR 3 DOSES AS NEEDED Strength: 0.4 mg 02/07/22   Arnoldo Lenis, MD  polyethylene glycol (MIRALAX / GLYCOLAX) 17 g packet Take 17 g by mouth daily. 08/11/22   Shahmehdi, Valeria Batman, MD  pravastatin (PRAVACHOL) 40 MG tablet Take 1 tablet (40 mg total) by mouth every evening. 07/31/21 02/09/28  Imogene Burn, PA-C  RESTASIS 0.05 % ophthalmic emulsion 1 drop 2 (two) times daily. 06/19/22   [provider]  senna (SENOKOT) 8.6 MG TABS tablet Take 2 tablets (17.2 mg total) by mouth 2 (two) times daily. 08/10/22   Shahmehdi, Valeria Batman, MD  sitaGLIPtin (JANUVIA) 25 MG tablet     [provider]  TRUE METRIX BLOOD GLUCOSE TEST test strip USE TO TEST BLOOD SUGAR UP TO TWICE DAILY OR AS DIRECTED 08/31/21   Renato Shin, MD      Allergies    Metformin, Niacin, and Niacin and related    Review of Systems   Review of Systems  All other systems reviewed and are negative.   Physical Exam Updated Vital Signs BP (!) 124/59   Pulse 71   Temp 100.3 F (37.9 C) (Rectal)   Resp 19   Ht 1.88 m (6' 2" )   Wt 110 kg   SpO2 90%   BMI 31.14 kg/m  Physical Exam Vitals and nursing note reviewed.  Constitutional:      Appearance: He is well-developed. He is ill-appearing.  HENT:     Head: Normocephalic and atraumatic.     Nose: No congestion or rhinorrhea.     Mouth/Throat:     Mouth: Mucous membranes are dry.     Pharynx: No oropharyngeal exudate.  Eyes:     General: No scleral icterus.       Right eye: No discharge.        Left eye: No discharge.     Conjunctiva/sclera: Conjunctivae normal.     Pupils: Pupils are equal, round, and reactive to light.  Neck:     Thyroid: No thyromegaly.     Vascular: No JVD.  Cardiovascular:     Rate and Rhythm: Normal rate and regular rhythm.     Heart sounds: Normal heart sounds. No murmur heard.    No friction rub. No gallop.     Comments: Dialysis catheter in the right upper chest appears clean dry and intact Pulmonary:     Effort: Pulmonary effort is normal. No respiratory distress.     Breath sounds: Normal breath sounds. No wheezing or rales.  Abdominal:     General: Bowel sounds are normal. There is no distension.     Palpations: Abdomen is soft. There is no mass.     Tenderness: There is abdominal tenderness.     Comments: Mild diffuse abdominal tenderness, he has tympanitic sounds to percussion diffusely but no obvious  distention.  Musculoskeletal:        General: No tenderness. Normal range of motion.     Cervical back: Normal range of motion and neck supple.     Right lower leg: No edema.     Left lower leg: No edema.  Lymphadenopathy:     Cervical: No cervical adenopathy.  Skin:    General: Skin is warm and dry.     Findings: No erythema or rash.  Neurological:     Mental Status: He is alert.     Coordination: Coordination normal.     Comments: The  patient is able to move all 4 extremities but diffusely generally weak, answers questions minimally  Psychiatric:        Behavior: Behavior normal.     ED Results / Procedures / Treatments   Labs (all labs ordered are listed, but only abnormal results are displayed) Labs Reviewed  COMPREHENSIVE METABOLIC PANEL - Abnormal; Notable for the following components:      Result Value   Chloride 92 (*)    Glucose, Bld 133 (*)    BUN 63 (*)    Creatinine, Ser 4.90 (*)    Total Protein 8.2 (*)    Albumin 3.3 (*)    AST 711 (*)    ALT 273 (*)    Alkaline Phosphatase 613 (*)    Total Bilirubin 3.3 (*)    GFR, Estimated 12 (*)    Anion gap 16 (*)    All other components within normal limits  CBC WITH DIFFERENTIAL/PLATELET - Abnormal; Notable for the following components:   WBC 24.9 (*)    RBC 2.86 (*)    Hemoglobin 9.1 (*)    HCT 28.2 (*)    RDW 16.4 (*)    Neutro Abs 20.9 (*)    Monocytes Absolute 2.5 (*)    Abs Immature Granulocytes 0.34 (*)    All other components within normal limits  PROTIME-INR - Abnormal; Notable for the following components:   Prothrombin Time 18.4 (*)    INR 1.6 (*)    All other components within normal limits  APTT - Abnormal; Notable for the following components:   aPTT 38 (*)    All other components within normal limits  CULTURE, BLOOD (ROUTINE X 2)  CULTURE, BLOOD (ROUTINE X 2)  URINE CULTURE  C DIFFICILE QUICK SCREEN W PCR REFLEX    LACTIC ACID, PLASMA  LACTIC ACID, PLASMA  URINALYSIS, ROUTINE W REFLEX  MICROSCOPIC  LIPASE, BLOOD    EKG EKG Interpretation  Date/Time:  Monday August 19 2022 06:42:06 EDT Ventricular Rate:  64 PR Interval:  358 QRS Duration: 104 QT Interval:  406 QTC Calculation: 419 R Axis:   17 Text Interpretation: Sinus arrhythmia Prolonged PR interval Posterior infarct, old Repol abnrm suggests ischemia, anterolateral Since last tracing T wave abnormality now more prominent Confirmed by Noemi Chapel [phone]) on 08/19/2022 6:49:22 AM  Radiology CT ABDOMEN PELVIS WO CONTRAST  Result Date: 08/19/2022 CLINICAL DATA:  emesis and abdominal pain since yesterday. EXAM: CT ABDOMEN AND PELVIS WITHOUT CONTRAST TECHNIQUE: Multidetector CT imaging of the abdomen and pelvis was performed following the standard protocol without IV contrast. RADIATION DOSE REDUCTION: This exam was performed according to the departmental dose-optimization program which includes automated exposure control, adjustment of the mA and/or kV according to patient size and/or use of iterative reconstruction technique. COMPARISON:  CT May 22, 2021. FINDINGS: Lower chest: Scarring/atelectasis in the bilateral lung bases. Hepatobiliary: Scattered bilobar hypodense hepatic lesions are technically too small to accurately characterize an incompletely evaluated on this noncontrast examination but appears similar prior and statistically likely to reflect benign cysts or hemangiomas. Gallbladder is distended with pericholecystic fluid and gallbladder wall thickening consistent with cholecystitis. No biliary ductal dilation. Pancreas: No pancreatic ductal dilation or evidence of acute inflammation. Spleen: Normal in size without focal abnormality. Adrenals/Urinary Tract: Bilateral adrenal glands appear normal. No hydronephrosis. Bilateral hypodense renal lesions measure fluid density consistent with cysts and considered benign requiring no independent imaging follow-up. Additional bilateral hypodense renal lesions are technically  too small to accurately characterize but statistically likely  to reflect cysts and considered benign requiring no independent imaging follow-up. Urinary bladder is unremarkable for degree of distension. Stomach/Bowel: No radiopaque enteric contrast material was administered. Stomach is minimally distended limiting evaluation. No pathologic dilation of large or small bowel. Appendix is not confidently identified however there is no pericecal inflammation. Vascular/Lymphatic: Aortic atherosclerosis. No pathologically enlarged abdominal or pelvic lymph nodes. Reproductive: Prostate is mildly enlarged, unchanged. Other: No significant abdominopelvic free fluid. Musculoskeletal: Spinal stimulating generator in the posterior paramedian right lumbar spine soft tissues. Lumbosacral fixation hardware. Multilevel degenerative changes spine. IMPRESSION: 1. Acute cholecystitis, without evidence of perforation. 2. No evidence of bowel obstruction. 3.  Aortic Atherosclerosis (ICD10-I70.0). Electronically Signed   By: Dahlia Bailiff M.D.   On: 08/19/2022 08:17   DG Chest Port 1 View  Result Date: 08/19/2022 CLINICAL DATA:  Sepsis. EXAM: PORTABLE CHEST 1 VIEW COMPARISON:  August 06, 2022. FINDINGS: Stable cardiomegaly. Right internal jugular dialysis catheter is unchanged in position. Both lungs are clear. The visualized skeletal structures are unremarkable. IMPRESSION: No active disease. Electronically Signed   By: Marijo Conception M.D.   On: 08/19/2022 08:16    Procedures .Critical Care  Performed by: Noemi Chapel, MD Authorized by: Noemi Chapel, MD   Critical care provider statement:    Critical care time (minutes):  30   Critical care time was exclusive of:  Separately billable procedures and treating other patients and teaching time   Critical care was necessary to treat or prevent imminent or life-threatening deterioration of the following conditions:  Sepsis and renal failure   Critical care was time spent  personally by me on the following activities:  Development of treatment plan with patient or surrogate, discussions with consultants, evaluation of patient's response to treatment, examination of patient, ordering and review of laboratory studies, ordering and review of radiographic studies, ordering and performing treatments and interventions, pulse oximetry, re-evaluation of patient's condition and review of old charts   I assumed direction of critical care for this patient from another provider in my specialty: no     Care discussed with: admitting provider   Comments:           Medications Ordered in ED Medications  piperacillin-tazobactam (ZOSYN) IVPB 3.375 g (has no administration in time range)  sodium chloride 0.9 % bolus 500 mL (500 mLs Intravenous New Bag/Given 08/19/22 0735)  fentaNYL (SUBLIMAZE) injection 50 mcg (50 mcg Intravenous Given 08/19/22 0734)  acetaminophen (TYLENOL) tablet 1,000 mg (1,000 mg Oral Given 08/19/22 0815)    ED Course/ Medical Decision Making/ A&P Clinical Course as of 08/19/22 0902  Mon Aug 19, 2022  0853 Labs significantly elevated bilirubin, LFTs and creatinine of 4.9 [BM]  0853 Patient currently undergoing dialysis through dialysis catheter in the right upper chest wall.  Potassium is normal at 4.3.  CBC shows leukocytosis of 25,000 with large left shift INR of 1.6 [BM]  0853 CT scan personally viewed and interpreted by myself showing cholecystitis with significant inflammatory changes in the right upper quadrant.  I discussed the care with the consultant Dr. Blake Divine who will see the patient in consultation and make recommendations about management, whether surgical or medical  We will discuss with hospitalist to manage admission.  Patient was given antibiotics for sepsis given severe leukocytosis with obvious source of inflammatory infectious cause [BM]  0854 He does have a fever and a leukocytosis consistent with a sepsis syndrome [BM]     Clinical Course User Index [BM] Noemi Chapel, MD  Medical Decision Making Amount and/or Complexity of Data Reviewed Labs: ordered. Radiology: ordered. ECG/medicine tests: ordered.  Risk OTC drugs. Prescription drug management. Decision regarding hospitalization.   This patient presents to the ED for concern of vomiting diarrhea weakness and hypotension, this involves an extensive number of treatment options, and is a complaint that carries with it a high risk of complications and morbidity.  The differential diagnosis includes infections including C. difficile, intra-abdominal pathology such as bowel obstruction or ischemic bowel, consider hyperkalemia, worsening renal function, thankfully he is not hypoxic or tachycardic   Co morbidities that complicate the patient evaluation  Recent requirement of dialysis catheter Hypertension Coronary disease status post stenting Recent admission with congestive heart failure   Additional history obtained:  Additional history obtained from electronic medical record including discharge summary and course of hospitalization External records from outside source obtained and reviewed including echocardiogram from July 29, 2022, ejection fraction of 35 to 40%, global hypokinesis   Lab Tests:  I Ordered, and personally interpreted labs.  The pertinent results include: Leukocytosis with leftward shift, worsening renal dysfunction although the patient is on dialysis and is difficult to know if this is related to the patient's active infection or poor perfusion or just related to needing to have dialysis Liver function tests are significantly elevated including bilirubin   Imaging Studies ordered:  I ordered imaging studies including CT scan of the abdomen and pelvis I independently visualized and interpreted imaging which showed acute cholecystitis I agree with the radiologist interpretation   Cardiac Monitoring:  / EKG:  The patient was maintained on a cardiac monitor.  I personally viewed and interpreted the cardiac monitored which showed an underlying rhythm of: Sinus rhythm   Consultations Obtained:  I requested consultation with the general surgeon Dr. Constance Haw as well as the hospitalist,  and discussed lab and imaging findings as well as pertinent plan - they recommend: Admission   Problem List / ED Course / Critical interventions / Medication management  Patient is critically ill with what appears to be sepsis from cholecystitis I ordered medication including antibiotics, pain medications and IV fluids for hypotension infection and cholecystitis Reevaluation of the patient after these medicines showed that the patient critically ill I have reviewed the patients home medicines and have made adjustments as needed   Social Determinants of Health:  Congestive heart failure Onset end-stage renal disease on dialysis for the last couple of weeks Acute cholecystitis Some degree of failure to thrive   Test / Admission - Considered:  We will need to be admitted to high level of care         Final Clinical Impression(s) / ED Diagnoses Final diagnoses:  Sepsis with acute renal failure without septic shock, due to unspecified organism, unspecified acute renal failure type ([place])  Acute cholecystitis     Noemi Chapel, MD 08/19/22 [phone]

## 2022-08-19 NOTE — Consult Note (Addendum)
[facility] Consult  Reason for Consult: Acute cholecystitis  Referring Physician: Dr. Sabra Heck, Dr. Manuella Ghazi     HPI: [name] is a 76 y.o. male with acute cholecystitis on CT scan and elevated LFTs. He came in having extreme abdominal pain on the right side and vomiting. He has a history of DM, CHF, ESRD on dialysis that was just started 12 days ago or so via a dialysis catheter. He was admitted from 8/6-8/19 for chest pain, SOB, fluid overload, worsening renal status and had to be intubated and started on dialysis. He also had a UTI at that time. He had some complaints of abdominal pain then but this resolved with bowel regimen. He says that this pain is severe and he is ready to get something done. He is here in the ED with his friend Abbe Amsterdam and I spoke to his brother over the phone.   He had been seen by Cardiology his last visit and had his imdur changed for his chronic angina. He is not a catheterization candidate it reports due to his dialysis. Acutely, his ECHO had changed with reduced EF 35-40% before he started dialysis.    ECHO: IMPRESSIONS   1. Limited echo for LVEF and regional wall motion.   2. No apical thrombus by Definity contrast. Left ventricular ejection  fraction, by estimation, is 35 to 40%. Left ventricular ejection fraction  by 2D MOD biplane is 36.6 %. The left ventricle has moderately decreased  function. The left ventricle  demonstrates severe global hypokinesis, worse at the apex. Regional wall  motion is difficult to ascertain given frequent PVC's.   3. The inferior vena cava is dilated in size with <50% respiratory  variability, suggesting right atrial pressure of 15 mmHg.   4. Right ventricular systolic function was not well visualized. The right  ventricular size is not well visualized.   5. Rhythm strip during this exam demonstrates atrial fibrillation.   Comparison(s): Changes from prior study are noted. 02/07/2022: LVEF 60-65%,  no  regional WMA's.  Past Medical History:  Diagnosis Date   Anginal pain ([place])    Anxiety    Benign prostatic hypertrophy    Nocturia   CAD (coronary artery disease)    a.  NSTEMI 10/2012 s/p DES to LAD & DES to 1st diagonal with residual diffuse nonobstructive dz in LCx/RCA    Chronic kidney disease, stage 3, mod decreased GFR (HCC)    Creatinine of 1.5 in 03/2009, STAGE 4 now   Degenerative joint disease    Depression    hx of   Diabetes mellitus, type II ([place]) 32/91/9166   Diastolic heart failure    Pedal edema, "a long time ago"   Erectile dysfunction    Flu 2013   hx of   GERD (gastroesophageal reflux disease)    Gout    Hemorrhoids    High cholesterol    Hypertension    Exercise induced   Myocardial infarction ([place])    Neuropathy    bilateral legs   NSVT (nonsustained ventricular tachycardia) ([place])    Exercise induced   Obesity    Tobacco abuse, 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 Oneida Alar- TCS= left sided colitis and mild proctitis likely due to ischemic colitis, moderate internal hemorrhoids, small nodule in the rectum, bx=tubular adenoma, EGD=-mild non-erosive gastritis   COLONOSCOPY WITH PROPOFOL N/A 07/24/2020   hemorrhoids,  rectosigmoid lipoma, small adenoma removed.   CORONARY ANGIOPLASTY     ESOPHAGOGASTRODUODENOSCOPY (EGD) WITH PROPOFOL N/A 02/05/2021   Normal esophagus, erythematous mucosa in entire stomach, no obvious ulcer, s/p biopsy. Normal duodenum. Negative H.pylori.    HERNIA REPAIR     INSERTION OF DIALYSIS CATHETER N/A 08/06/2022   Procedure: INSERTION OF DIALYSIS CATHETER;   Surgeon: Rosetta Posner, MD;  Location: [facility];  Service: Vascular;  Laterality: N/A;   JOINT REPLACEMENT     LEFT HEART CATHETERIZATION WITH CORONARY ANGIOGRAM N/A 11/16/2012   Procedure: LEFT HEART CATHETERIZATION WITH CORONARY ANGIOGRAM;  Surgeon: Sherren Mocha, MD;  Location: [facility] CATH LAB;  Service: Cardiovascular;  Laterality: N/A;   PERCUTANEOUS CORONARY STENT INTERVENTION (PCI-S)  11/16/2012   Procedure: PERCUTANEOUS CORONARY STENT INTERVENTION (PCI-S);  Surgeon: Sherren Mocha, MD;  Location: [facility] CATH LAB;  Service: Cardiovascular;;   POLYPECTOMY  07/24/2020   Procedure: POLYPECTOMY;  Surgeon: Daneil Dolin, MD;  Location: [facility];  Service: Endoscopy;;   SPINAL CORD STIMULATOR INSERTION N/A 06/10/2022   Procedure: Spinal cord stimulator placement;  Surgeon: Newman Pies, MD;  Location: [place];  Service: Neurosurgery;  Laterality: N/A;  RM 19/ to follow   THUMB AMPUTATION Left    50 years ago   TOTAL KNEE ARTHROPLASTY Right 11/15/2013   Procedure: RIGHT TOTAL KNEE ARTHROPLASTY;  Surgeon: Mauri Pole, MD;  Location: [facility];  Service: Orthopedics;  Laterality: Right;   TOTAL KNEE ARTHROPLASTY Left 03/07/2015   Procedure: LEFT TOTAL KNEE ARTHROPLASTY;  Surgeon: Paralee Cancel, MD;  Location: [facility];  Service: Orthopedics;  Laterality: Left;    Family History  Problem Relation Age of Onset   Hypertension Other    Cancer Other    Heart disease Other    Diabetes Other    Hypertension Mother    Cancer Father        brain   CAD Brother    Colon cancer Neg Hx     Social History   Tobacco Use   Smoking status: Former    Packs/day: 1.00    Years: 30.00    Total pack years: 30.00    Types: Cigarettes    Start date: 01/22/1965    Quit date: 12/23/1992    Years since quitting: 29.6   Smokeless tobacco: Never  Vaping Use   Vaping Use: Never used  Substance Use Topics   Alcohol use: No    Alcohol/week: 0.0 standard drinks of alcohol   Drug use: No    Medications: I  have reviewed the patient's current medications. Current Facility-Administered Medications  Medication Dose Route Frequency Provider Last Rate Last Admin   piperacillin-tazobactam (ZOSYN) IVPB 3.375 g  3.375 g Intravenous Once Noemi Chapel, MD       Current Outpatient Medications  Medication Sig Dispense Refill Last Dose   acarbose (PRECOSE) 50 MG tablet Take 50 mg by mouth in the morning, at noon, and at bedtime.       acetaminophen (TYLENOL) 500 MG tablet Take 2 tablets (1,000 mg total) by mouth every 6 (six) hours. 30 tablet 0    allopurinol (ZYLOPRIM) 100 MG tablet Take  100 mg by mouth daily.      amLODipine (NORVASC) 10 MG tablet Take 10 mg by mouth every evening.       aspirin EC 81 MG tablet Take 1 tablet (81 mg total) by mouth daily. 90 tablet 3    calcitRIOL (ROCALTROL) 0.25 MCG capsule Take 0.25 mcg by mouth every Sunday.       carboxymethylcellulose (REFRESH PLUS) 0.5 % SOLN Place 1 drop into both eyes daily as needed (dry eyes).      carvedilol (COREG) 6.25 MG tablet Take 6.25 mg by mouth 2 (two) times daily with a meal.      cholecalciferol (VITAMIN D) 1000 units tablet Take 1,000 Units daily by mouth.      cloNIDine (CATAPRES) 0.1 MG tablet Take 0.1 mg by mouth 3 (three) times daily.      epoetin alfa (EPOGEN) 3000 UNIT/ML injection Inject into the skin.      hydrALAZINE (APRESOLINE) 100 MG tablet Take 200 mg by mouth 2 (two) times daily.      insulin NPH Human (NOVOLIN N) 100 UNIT/ML injection Inject 0.18 mLs (18 Units total) into the skin every morning. Ands syringes 1/day (Patient taking differently: Inject 20 Units into the skin daily as needed (if CBG is high). Per sliding scale: not provided) 10 mL 11    isosorbide mononitrate (IMDUR) 60 MG 24 hr tablet Take 1 tablet (60 mg total) by mouth 2 (two) times daily. 180 tablet 3    mupirocin ointment (BACTROBAN) 2 % Apply 1 Application topically 3 (three) times daily.      nitroGLYCERIN (NITROSTAT) 0.4 MG SL tablet Place 1 tablet  (0.4 mg total) under the tongue every 5 (five) minutes as needed for chest pain. PLACE ONE TABLET UNDER THE TONGUE EVERY 5 MINUTES FOR 3 DOSES AS NEEDED Strength: 0.4 mg 25 tablet 3    polyethylene glycol (MIRALAX / GLYCOLAX) 17 g packet Take 17 g by mouth daily. 14 each 0    pravastatin (PRAVACHOL) 40 MG tablet Take 1 tablet (40 mg total) by mouth every evening. 90 tablet 3    RESTASIS 0.05 % ophthalmic emulsion 1 drop 2 (two) times daily.      senna (SENOKOT) 8.6 MG TABS tablet Take 2 tablets (17.2 mg total) by mouth 2 (two) times daily. 120 tablet 0    sitaGLIPtin (JANUVIA) 25 MG tablet       TRUE METRIX BLOOD GLUCOSE TEST test strip USE TO TEST BLOOD SUGAR UP TO TWICE DAILY OR AS DIRECTED 180 each 3      Allergies  Allergen Reactions   Metformin Nausea And Vomiting   Niacin    Niacin And Related Other (See Comments)    Reaction: flushing and burning side effects even with extended release     ROS:  A comprehensive review of systems was negative except for: Gastrointestinal: positive for abdominal pain and vomiting Genitourinary: positive for ESRD on dialysis   Blood pressure (!) 124/59, pulse 71, temperature 100.3 F (37.9 C), temperature source Rectal, resp. rate 19, height 6' 2"  (1.88 m), weight 110 kg, SpO2 90 %. Physical Exam Vitals reviewed.  Constitutional:      Appearance: Normal appearance.  HENT:     Head: Normocephalic.     Nose: Nose normal.  Eyes:     Extraocular Movements: Extraocular movements intact.  Cardiovascular:     Rate and Rhythm: Normal rate.  Pulmonary:     Effort: Pulmonary effort is normal.  Chest:  Comments: HD catheter on right chest  Abdominal:     General: There is distension.     Palpations: Abdomen is soft.     Tenderness: There is abdominal tenderness in the right upper quadrant and epigastric area.  Skin:    General: Skin is warm.  Neurological:     General: No focal deficit present.     Mental Status: He is alert.  Psychiatric:         Mood and Affect: Mood normal.     Results: Results for orders placed or performed during the hospital encounter of 08/19/22 (from the past 48 hour(s))  Lactic acid, plasma     Status: None   Collection Time: 08/19/22  7:32 AM  Result Value Ref Range   Lactic Acid, Venous 1.6 0.5 - 1.9 mmol/L    Comment: Performed at [facility], [address] [place], [place] [address]  Comprehensive metabolic panel     Status: Abnormal   Collection Time: 08/19/22  7:32 AM  Result Value Ref Range   Sodium 135 135 - 145 mmol/L   Potassium 4.3 3.5 - 5.1 mmol/L   Chloride 92 (L) 98 - 111 mmol/L   CO2 27 22 - 32 mmol/L   Glucose, Bld 133 (H) 70 - 99 mg/dL    Comment: Glucose reference range applies only to samples taken after fasting for at least 8 hours.   BUN 63 (H) 8 - 23 mg/dL   Creatinine, Ser 4.90 (H) 0.61 - 1.24 mg/dL   Calcium 9.2 8.9 - 10.3 mg/dL   Total Protein 8.2 (H) 6.5 - 8.1 g/dL   Albumin 3.3 (L) 3.5 - 5.0 g/dL   AST 711 (H) 15 - 41 U/L   ALT 273 (H) 0 - 44 U/L   Alkaline Phosphatase 613 (H) 38 - 126 U/L   Total Bilirubin 3.3 (H) 0.3 - 1.2 mg/dL   GFR, Estimated 12 (L) >60 mL/min    Comment: (NOTE) Calculated using the CKD-EPI Creatinine Equation (2021)    Anion gap 16 (H) 5 - 15    Comment: Performed at [facility], [address] [place], [place] [address]  CBC with Differential     Status: Abnormal   Collection Time: 08/19/22  7:32 AM  Result Value Ref Range   WBC 24.9 (H) 4.0 - 10.5 K/uL   RBC 2.86 (L) 4.22 - 5.81 MIL/uL   Hemoglobin 9.1 (L) 13.0 - 17.0 g/dL   HCT 28.2 (L) 39.0 - 52.0 %   MCV 98.6 80.0 - 100.0 fL   MCH 31.8 26.0 - 34.0 pg   MCHC 32.3 30.0 - 36.0 g/dL   RDW 16.4 (H) 11.5 - 15.5 %   Platelets 215 150 - 400 K/uL   nRBC 0.0 0.0 - 0.2 %   Neutrophils Relative % 85 %   Neutro Abs 20.9 (H) 1.7 - 7.7 K/uL   Lymphocytes Relative 3 %   Lymphs Abs 0.8 0.7 - 4.0 K/uL   Monocytes Relative 10 %   Monocytes Absolute 2.5 (H) 0.1 - 1.0 K/uL   Eosinophils  Relative 1 %   Eosinophils Absolute 0.2 0.0 - 0.5 K/uL   Basophils Relative 0 %   Basophils Absolute 0.1 0.0 - 0.1 K/uL   Immature Granulocytes 1 %   Abs Immature Granulocytes 0.34 (H) 0.00 - 0.07 K/uL    Comment: Performed at [facility], [address], [place], [place] [address]  Protime-INR     Status: Abnormal   Collection Time:  08/19/22  7:32 AM  Result Value Ref Range   Prothrombin Time 18.4 (H) 11.4 - 15.2 seconds   INR 1.6 (H) 0.8 - 1.2    Comment: (NOTE) INR goal varies based on device and disease states. Performed at [facility], [address] [place], [place] [address]   APTT     Status: Abnormal   Collection Time: 08/19/22  7:32 AM  Result Value Ref Range   aPTT 38 (H) 24 - 36 seconds    Comment:        IF BASELINE aPTT IS ELEVATED, SUGGEST PATIENT RISK ASSESSMENT BE USED TO DETERMINE APPROPRIATE ANTICOAGULANT THERAPY. Performed at [facility], [address], [place], [place] [address]   Blood Culture (routine x 2)     Status: None (Preliminary result)   Collection Time: 08/19/22  7:32 AM   Specimen: Left Antecubital; Blood  Result Value Ref Range   Specimen Description      LEFT ANTECUBITAL BOTTLES DRAWN AEROBIC AND ANAEROBIC   Special Requests      Blood Culture adequate volume Performed at [facility], [address] [place], [place] [address]    Culture PENDING    Report Status PENDING   Blood Culture (routine x 2)     Status: None (Preliminary result)   Collection Time: 08/19/22  7:40 AM   Specimen: BLOOD RIGHT FOREARM  Result Value Ref Range   Specimen Description      BLOOD RIGHT FOREARM BOTTLES DRAWN AEROBIC AND ANAEROBIC   Special Requests      Blood Culture results may not be optimal due to an inadequate volume of blood received in culture bottles Performed at [facility], [address] [place], [place] [address]    Culture PENDING    Report Status PENDING    Personally reviewed- distended gallbladder, hazy with fluid, wall thickening   CT ABDOMEN PELVIS WO CONTRAST  Result Date: 08/19/2022 CLINICAL DATA:  emesis and abdominal pain since yesterday. EXAM: CT ABDOMEN AND PELVIS WITHOUT CONTRAST TECHNIQUE: Multidetector CT imaging of the abdomen and pelvis was performed following the standard protocol without IV contrast. RADIATION DOSE REDUCTION: This exam was performed according to the departmental dose-optimization program which includes automated exposure control, adjustment of the mA and/or kV according to patient size and/or use of iterative reconstruction technique. COMPARISON:  CT May 22, 2021. FINDINGS: Lower chest: Scarring/atelectasis in the bilateral lung bases. Hepatobiliary: Scattered bilobar hypodense hepatic lesions are technically too small to accurately characterize an incompletely evaluated on this noncontrast examination but appears similar prior and statistically likely to reflect benign cysts or hemangiomas. Gallbladder is distended with pericholecystic fluid and gallbladder wall thickening consistent with cholecystitis. No biliary ductal dilation. Pancreas: No pancreatic ductal dilation or evidence of acute inflammation. Spleen: Normal in size without focal abnormality. Adrenals/Urinary Tract: Bilateral adrenal glands appear normal. No hydronephrosis. Bilateral hypodense renal lesions measure fluid density consistent with cysts and considered benign requiring no independent imaging follow-up. Additional bilateral hypodense renal lesions are technically too small to accurately characterize but statistically likely to reflect cysts and considered benign requiring no independent imaging follow-up. Urinary bladder is unremarkable for degree of distension. Stomach/Bowel: No radiopaque enteric contrast material was administered. Stomach is minimally distended limiting evaluation. No pathologic dilation of large or small bowel. Appendix is not confidently identified however there is no pericecal inflammation. Vascular/Lymphatic:  Aortic atherosclerosis. No pathologically enlarged abdominal or pelvic lymph nodes. Reproductive: Prostate is mildly enlarged, unchanged. Other: No significant abdominopelvic free fluid. Musculoskeletal: Spinal stimulating  generator in the posterior paramedian right lumbar spine soft tissues. Lumbosacral fixation hardware. Multilevel degenerative changes spine. IMPRESSION: 1. Acute cholecystitis, without evidence of perforation. 2. No evidence of bowel obstruction. 3.  Aortic Atherosclerosis (ICD10-I70.0). Electronically Signed   By: Dahlia Bailiff M.D.   On: 08/19/2022 08:17   DG Chest Port 1 View  Result Date: 08/19/2022 CLINICAL DATA:  Sepsis. EXAM: PORTABLE CHEST 1 VIEW COMPARISON:  August 06, 2022. FINDINGS: Stable cardiomegaly. Right internal jugular dialysis catheter is unchanged in position. Both lungs are clear. The visualized skeletal structures are unremarkable. IMPRESSION: No active disease. Electronically Signed   By: Marijo Conception M.D.   On: 08/19/2022 08:16     Assessment & Plan:  [name] is a 76 y.o. male with acute cholecystitis based on exam, CT and LFTs. Bilirubin up a little, would trend. [place] not indicated currently but if Bilirubin continued to go up with get [place] or MRCP to ensure no choledocholithiasis. Lipase ordered as add on today.   Discussed with patient, friend Abbe [place] and brother, that given all of his recent issues and comorbidities, a general anesthesia would be dangerous and recommend antibiotics and cholecystostomy tube for his acute cholecystitis at this time. Discussed that this may be permanent if he does not get better or if cardiology ultimately says he is high risk candidate for surgery versus that we can evaluated to get it out in a few months once things have resolved and his dialysis is more stable etc.   All questions were answered to the satisfaction of the patient and family. Discussed with Dr. Manuella Ghazi and Dr. Sabra Heck.   Can follow up with me in a month  after the cholecystostomy tube to see how he is and if we can work towards getting him evaluated to see if it can come out or stay.   Virl Cagey 08/19/2022, 8:45 AM

## 2022-08-19 NOTE — Progress Notes (Signed)
Pharmacy Antibiotic Note  [name] is a 76 y.o. male admitted on 08/19/2022 with  intra-abdominal infection .  Pharmacy has been consulted for zosyn dosing.  Plan: Zosyn 3.375gm IV x 1, then 2.25gm IV q8h F/U cxs and clinical progress Monitor V/S, labs  Height: 6' 2"  (188 cm) Weight: 110 kg (242 lb 8.1 oz) IBW/kg (Calculated) : 82.2  Temp (24hrs), Avg:98.7 F (37.1 C), Min:97.8 F (36.6 C), Max:100.3 F (37.9 C)  Recent Labs  Lab 08/19/22 0732 08/19/22 0928  WBC 24.9*  --   CREATININE 4.90*  --   LATICACIDVEN 1.6 1.3    Estimated Creatinine Clearance: 16.7 mL/min (A) (by C-G formula based on SCr of 4.9 mg/dL (H)).    Allergies  Allergen Reactions   Metformin Nausea And Vomiting   Niacin    Niacin And Related Other (See Comments)    Reaction: flushing and burning side effects even with extended release    Antimicrobials this admission: Zosyn  8/28 >>   Microbiology results: 8/28 BCx: pending  Thank you for allowing pharmacy to be a part of this patient's care.  Isac Sarna, BS Pharm D, BCPS Clinical Pharmacist 08/19/2022 10:56 AM

## 2022-08-19 NOTE — ED Notes (Signed)
Pt's BP low for the last couple of cycles, last BP was 103/45--MD made aware

## 2022-08-19 NOTE — ED Notes (Signed)
Pt daughter requested phone call when pt arrives back from dialysis  [phone]- Rachel Moulds

## 2022-08-19 NOTE — ED Notes (Signed)
Late dose of Zosyn-pharmacy brought the medication and stocked pyxis at this time

## 2022-08-19 NOTE — H&P (Addendum)
History and Physical    [name] QIH:[id number] DOB: January 13, 1946 DOA: 08/19/2022  PCP: Redmond School, MD   Patient coming from: SNF  Chief Complaint: Abdominal pain with nausea and vomiting  HPI: [name] is a 76 y.o. male with medical history significant for ESRD on HD recently initiated, CAD, chronic diastolic/systolic CHF, type 2 diabetes, hypertension, dyslipidemia, GERD, gout, and obesity who was recently discharged on 8/19 after admission for chest pain with volume overload.  He was started on hemodialysis after dialysis catheter placement during that admission and was discharged to SNF.  He is complaining of some epigastric abdominal pain without radiation and associated nausea and vomiting for the last 3-4 days.  He denies any diarrhea, but does have poor appetite and it seems that eating seems to make his pain worse.  No fevers or chills noted.  He complains of significant pain at this time rated 10/10.  He has last had dialysis this past Friday on the 25th.   ED Course: Temperature 100.3 F and stable vital signs otherwise noted.  BUN 63 and creatinine 4.9 with hemoglobin 9.1 and leukocytosis of 24,900 noted.  IV Zosyn has been ordered and patient given some IV fluid initially given some hypotension.  Chest x-ray with no acute findings and CT abdomen with findings of acute cholecystitis.  LFTs and bilirubin elevation noted as well as alkaline phosphatase elevation.  Patient has been seen by general surgery in the ED with recommendations to transfer to [facility] for cholecystostomy tube placement.  Nephrology also notified about the need for hemodialysis while inpatient.  Review of Systems: Reviewed as noted above, otherwise negative.  Past Medical History:  Diagnosis Date   Anginal pain ([place])    Anxiety    Benign prostatic hypertrophy    Nocturia   CAD (coronary artery disease)    a.  NSTEMI 10/2012 s/p DES to LAD & DES to 1st diagonal with residual diffuse  nonobstructive dz in LCx/RCA    Chronic kidney disease, stage 3, mod decreased GFR (HCC)    Creatinine of 1.5 in 03/2009, STAGE 4 now   Degenerative joint disease    Depression    hx of   Diabetes mellitus, type II ([place]) 87/56/4332   Diastolic heart failure    Pedal edema, "a long time ago"   Erectile dysfunction    Flu 2013   hx of   GERD (gastroesophageal reflux disease)    Gout    Hemorrhoids    High cholesterol    Hypertension    Exercise induced   Myocardial infarction ([place])    Neuropathy    bilateral legs   NSVT (nonsustained ventricular tachycardia) ([place])    Exercise induced   Obesity    Tobacco abuse, 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 Oneida Alar- TCS= left sided colitis and mild proctitis likely due to ischemic colitis, moderate internal hemorrhoids, small nodule in the rectum, bx=tubular adenoma, EGD=-mild non-erosive gastritis   COLONOSCOPY WITH PROPOFOL N/A 07/24/2020   hemorrhoids,  rectosigmoid lipoma, small adenoma  removed.   CORONARY ANGIOPLASTY     ESOPHAGOGASTRODUODENOSCOPY (EGD) WITH PROPOFOL N/A 02/05/2021   Normal esophagus, erythematous mucosa in entire stomach, no obvious ulcer, s/p biopsy. Normal duodenum. Negative H.pylori.    HERNIA REPAIR     INSERTION OF DIALYSIS CATHETER N/A 08/06/2022   Procedure: INSERTION OF DIALYSIS CATHETER;  Surgeon: Rosetta Posner, MD;  Location: [facility] OR[facility];  Service: Vascular;  Laterality: N/A;   JOINT REPLACEMENT     LEFT HEART CATHETERIZATION WITH CORONARY ANGIOGRAM N/A 11/16/2012   Procedure: LEFT HEART CATHETERIZATION WITH CORONARY ANGIOGRAM;  Surgeon:  Sherren Mocha, MD;  Location: [facility] CATH LAB;  Service: Cardiovascular;  Laterality: N/A;   PERCUTANEOUS CORONARY STENT INTERVENTION (PCI-S)  11/16/2012   Procedure: PERCUTANEOUS CORONARY STENT INTERVENTION (PCI-S);  Surgeon: Sherren Mocha, MD;  Location: [facility] CATH LAB;  Service: Cardiovascular;;   POLYPECTOMY  07/24/2020   Procedure: POLYPECTOMY;  Surgeon: Daneil Dolin, MD;  [facility]: [facility];  Service: Endoscopy;;   SPINAL CORD STIMULATOR INSERTION N/A 06/10/2022   Procedure: Spinal cord stimulator placement;  Surgeon: Newman Pies, MD;  Location: [place];  Service: Neurosurgery;  Laterality: N/A;  RM 19/ to follow   THUMB AMPUTATION Left    50 years ago   TOTAL KNEE ARTHROPLASTY Right 11/15/2013   Procedure: RIGHT TOTAL KNEE ARTHROPLASTY;  Surgeon: Mauri Pole, MD;  Location: [facility];  Service: Orthopedics;  Laterality: Right;   TOTAL KNEE ARTHROPLASTY Left 03/07/2015   Procedure: LEFT TOTAL KNEE ARTHROPLASTY;  Surgeon: Paralee Cancel, MD;  Location: [facility] ORS;  Service: Orthopedics;  Laterality: Left;     reports that he quit smoking about 29 years ago. His smoking use included cigarettes. He started smoking about 57 years ago. He has a 30.00 pack-year smoking history. He has never used smokeless tobacco. He reports that he does not drink alcohol and does not use drugs.  Allergies  Allergen Reactions   Metformin Nausea And Vomiting   Niacin    Niacin And Related Other (See Comments)    Reaction: flushing and burning side effects even with extended release    Family History  Problem Relation Age of Onset   Hypertension Other    Cancer Other    Heart disease Other    Diabetes Other    Hypertension Mother    Cancer Father        brain   CAD Brother    Colon cancer Neg Hx     Prior to Admission medications   Medication Sig Start Date End Date Taking? Authorizing Provider  acarbose (PRECOSE) 50 MG tablet Take 50 mg by mouth in the morning, at noon, and at bedtime.  10/20/17    [provider]  acetaminophen (TYLENOL) 500 MG tablet Take 2 tablets (1,000 mg total) by mouth every 6 (six) hours. 08/10/22   Shahmehdi, Valeria Batman, MD  allopurinol (ZYLOPRIM) 100 MG tablet Take 100 mg by mouth daily. 08/17/20   [provider]  amLODipine (NORVASC) 10 MG tablet Take 10 mg by mouth every evening.     [provider]  aspirin EC 81 MG tablet Take 1 tablet (81 mg total) by mouth daily. 09/14/19   Herminio Commons, MD  calcitRIOL (ROCALTROL) 0.25 MCG capsule Take 0.25 mcg by mouth every Sunday.  12/13/16   [provider]  carboxymethylcellulose (REFRESH PLUS) 0.5 % SOLN Place 1 drop into both eyes daily as needed (dry eyes).    [provider]  carvedilol (COREG) 6.25 MG tablet Take  6.25 mg by mouth 2 (two) times daily with a meal. 04/18/22 04/18/23  [provider]  cholecalciferol (VITAMIN D) 1000 units tablet Take 1,000 Units daily by mouth.    [provider]  cloNIDine (CATAPRES) 0.1 MG tablet Take 0.1 mg by mouth 3 (three) times daily. 02/15/20   [provider]  epoetin alfa (EPOGEN) 3000 UNIT/ML injection Inject into the skin. 07/03/22   [provider]  hydrALAZINE (APRESOLINE) 100 MG tablet Take 200 mg by mouth 2 (two) times daily. 01/24/22   [provider]  insulin NPH Human (NOVOLIN N) 100 UNIT/ML injection Inject 0.18 mLs (18 Units total) into the skin every morning. Ands syringes 1/day Patient taking differently: Inject 20 Units into the skin daily as needed (if CBG is high). Per sliding scale: not provided 06/27/21   Renato Shin, MD  isosorbide mononitrate (IMDUR) 60 MG 24 hr tablet Take 1 tablet (60 mg total) by mouth 2 (two) times daily. 06/26/22 06/21/23  Arnoldo Lenis, MD  mupirocin ointment (BACTROBAN) 2 % Apply 1 Application topically 3 (three) times daily. 07/10/22   [provider]  nitroGLYCERIN (NITROSTAT) 0.4 MG SL tablet Place 1 tablet (0.4 mg total) under the  tongue every 5 (five) minutes as needed for chest pain. PLACE ONE TABLET UNDER THE TONGUE EVERY 5 MINUTES FOR 3 DOSES AS NEEDED Strength: 0.4 mg 02/07/22   Arnoldo Lenis, MD  polyethylene glycol (MIRALAX / GLYCOLAX) 17 g packet Take 17 g by mouth daily. 08/11/22   Shahmehdi, Valeria Batman, MD  pravastatin (PRAVACHOL) 40 MG tablet Take 1 tablet (40 mg total) by mouth every evening. 07/31/21 02/09/28  Imogene Burn, PA-C  RESTASIS 0.05 % ophthalmic emulsion 1 drop 2 (two) times daily. 06/19/22   [provider]  senna (SENOKOT) 8.6 MG TABS tablet Take 2 tablets (17.2 mg total) by mouth 2 (two) times daily. 08/10/22   Shahmehdi, Valeria Batman, MD  sitaGLIPtin (JANUVIA) 25 MG tablet     [provider]  TRUE METRIX BLOOD GLUCOSE TEST test strip USE TO TEST BLOOD SUGAR UP TO TWICE DAILY OR AS DIRECTED 08/31/21   Renato Shin, MD    Physical Exam: Vitals:   08/19/22 0815 08/19/22 0830 08/19/22 0845 08/19/22 0900  BP: (!) 124/59 (!) 116/44 (!) 113/93 107/74  Pulse: 71 71 69 69  Resp: 19 17 (!) 24 17  Temp:      TempSrc:      SpO2: 90% 97% 95% 97%  Weight:      Height:        Constitutional: NAD, calm, comfortable, obese Vitals:   08/19/22 0815 08/19/22 0830 08/19/22 0845 08/19/22 0900  BP: (!) 124/59 (!) 116/44 (!) 113/93 107/74  Pulse: 71 71 69 69  Resp: 19 17 (!) 24 17  Temp:      TempSrc:      SpO2: 90% 97% 95% 97%  Weight:      Height:       Eyes: lids and conjunctivae normal Neck: normal, supple Respiratory: clear to auscultation bilaterally. Normal respiratory effort. No accessory muscle use.  Cardiovascular: Regular rate and rhythm, no murmurs.  Dialysis catheter present C/D/I Abdomen: Tenderness to palpation with Murphy sign positive especially over right upper quadrant. Musculoskeletal:  No edema. Skin: no rashes, lesions, ulcers.  Psychiatric: Flat affect  Labs on Admission: I have personally reviewed following labs and imaging studies  CBC: Recent Labs  Lab  08/19/22 0732  WBC 24.9*  NEUTROABS 20.9*  HGB 9.1*  HCT 28.2*  MCV 98.6  PLT 324   Basic Metabolic Panel: Recent Labs  Lab 08/19/22 0732  NA 135  K 4.3  CL 92*  CO2 27  GLUCOSE 133*  BUN 63*  CREATININE 4.90*  CALCIUM 9.2   GFR: Estimated Creatinine Clearance: 16.7 mL/min (A) (by C-G formula based on SCr of 4.9 mg/dL (H)). Liver Function Tests: Recent Labs  Lab 08/19/22 0732  AST 711*  ALT 273*  ALKPHOS 613*  BILITOT 3.3*  PROT 8.2*  ALBUMIN 3.3*   No results for input(s): "LIPASE", "AMYLASE" in the last 168 hours. No results for input(s): "AMMONIA" in the last 168 hours. Coagulation Profile: Recent Labs  Lab 08/19/22 0732  INR 1.6*   Cardiac Enzymes: No results for input(s): "CKTOTAL", "CKMB", "CKMBINDEX", "TROPONINI" in the last 168 hours. BNP (last 3 results) No results for input(s): "PROBNP" in the last 8760 hours. HbA1C: No results for input(s): "HGBA1C" in the last 72 hours. CBG: No results for input(s): "GLUCAP" in the last 168 hours. Lipid Profile: No results for input(s): "CHOL", "HDL", "LDLCALC", "TRIG", "CHOLHDL", "LDLDIRECT" in the last 72 hours. Thyroid Function Tests: No results for input(s): "TSH", "T4TOTAL", "FREET4", "T3FREE", "THYROIDAB" in the last 72 hours. Anemia Panel: No results for input(s): "VITAMINB12", "FOLATE", "FERRITIN", "TIBC", "IRON", "RETICCTPCT" in the last 72 hours. Urine analysis:    Component Value Date/Time   COLORURINE YELLOW 08/01/2022 [place] 08/01/2022 1629   LABSPEC 1.011 08/01/2022 1629   PHURINE 5.0 08/01/2022 1629   GLUCOSEU NEGATIVE 08/01/2022 1629   HGBUR NEGATIVE 08/01/2022 [place] 08/01/2022 [place] 08/01/2022 1629   PROTEINUR 30 (A) 08/01/2022 1629   UROBILINOGEN 1.0 03/02/2015 1100   NITRITE NEGATIVE 08/01/2022 1629   LEUKOCYTESUR NEGATIVE 08/01/2022 1629    Radiological Exams on Admission: CT ABDOMEN PELVIS WO CONTRAST  Result Date:  08/19/2022 CLINICAL DATA:  emesis and abdominal pain since yesterday. EXAM: CT ABDOMEN AND PELVIS WITHOUT CONTRAST TECHNIQUE: Multidetector CT imaging of the abdomen and pelvis was performed following the standard protocol without IV contrast. RADIATION DOSE REDUCTION: This exam was performed according to the departmental dose-optimization program which includes automated exposure control, adjustment of the mA and/or kV according to patient size and/or use of iterative reconstruction technique. COMPARISON:  CT May 22, 2021. FINDINGS: Lower chest: Scarring/atelectasis in the bilateral lung bases. Hepatobiliary: Scattered bilobar hypodense hepatic lesions are technically too small to accurately characterize an incompletely evaluated on this noncontrast examination but appears similar prior and statistically likely to reflect benign cysts or hemangiomas. Gallbladder is distended with pericholecystic fluid and gallbladder wall thickening consistent with cholecystitis. No biliary ductal dilation. Pancreas: No pancreatic ductal dilation or evidence of acute inflammation. Spleen: Normal in size without focal abnormality. Adrenals/Urinary Tract: Bilateral adrenal glands appear normal. No hydronephrosis. Bilateral hypodense renal lesions measure fluid density consistent with cysts and considered benign requiring no independent imaging follow-up. Additional bilateral hypodense renal lesions are technically too small to accurately characterize but statistically likely to reflect cysts and considered benign requiring no independent imaging follow-up. Urinary bladder is unremarkable for degree of distension. Stomach/Bowel: No radiopaque enteric contrast material was administered. Stomach is minimally distended limiting evaluation. No pathologic dilation of large or small bowel. Appendix is not confidently identified however there is no pericecal inflammation. Vascular/Lymphatic: Aortic atherosclerosis. No pathologically enlarged  abdominal or pelvic lymph nodes. Reproductive: Prostate is mildly enlarged, unchanged. Other: No significant abdominopelvic free fluid. Musculoskeletal: Spinal stimulating generator  in the posterior paramedian right lumbar spine soft tissues. Lumbosacral fixation hardware. Multilevel degenerative changes spine. IMPRESSION: 1. Acute cholecystitis, without evidence of perforation. 2. No evidence of bowel obstruction. 3.  Aortic Atherosclerosis (ICD10-I70.0). Electronically Signed   By: Dahlia Bailiff M.D.   On: 08/19/2022 08:17   DG Chest Port 1 View  Result Date: 08/19/2022 CLINICAL DATA:  Sepsis. EXAM: PORTABLE CHEST 1 VIEW COMPARISON:  August 06, 2022. FINDINGS: Stable cardiomegaly. Right internal jugular dialysis catheter is unchanged in position. Both lungs are clear. The visualized skeletal structures are unremarkable. IMPRESSION: No active disease. Electronically Signed   By: Marijo Conception M.D.   On: 08/19/2022 08:16    EKG: Independently reviewed.  Atrial fibrillation 68 bpm  Assessment/Plan Principal Problem:   Acute cholecystitis Active Problems:   ESRD (end stage renal disease) (HCC)   CAD S/P percutaneous coronary angioplasty   GERD (gastroesophageal reflux disease)   Diabetes mellitus type 2 in obese (HCC)   Anemia, normocytic normochromic   Essential hypertension   Mixed hyperlipidemia   Chronic diastolic heart failure (HCC)   Chronic gout, unspecified, 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ophi)   Body mass index (BMI) 33.0-33.9, adult    Acute cholecystitis Appreciate general surgery recommendations, plan to place cholecystostomy drain IR consulted and patient will transfer to [facility] for this procedure Continue n.p.o. Avoid IV fluid Pain management as needed with IV Dilaudid Is a friend as needed for nausea and vomiting  Type 2 diabetes Renal/carb modified diet SSI  Chronic systolic and diastolic CHF Echocardiogram 8/23 with LVEF 35-40% with LV global hypokinesis Does not appear  volume overloaded, monitor  CAD with prior stent Hold aspirin and statin for now  ESRD on HD Primary dialysis catheter present since 8/15 Last dialysis on Friday 8/25 Nephrology consulted for inpatient hemodialysis management  Hypertension Currently with stable blood pressure readings Continue home medications  Opioid dependence/chronic back pain Continue pain management oral and IV as ordered  Paroxysmal atrial fibrillation New diagnosis in prior admission Not pursuing chronic anticoagulation therapy given chronic anemia CHA2DS2-VASc of 6  Anemia of chronic renal disease Hemoglobin 9.1, transfuse for hemoglobin less than 8 No overt bleeding noted  Dyslipidemia Hold statin given transaminitis due to above  History of gout No current flare Continue allopurinol  GERD PPI  Obesity BMI 31.14   DVT prophylaxis: SCDs Code Status: Full Family Communication: None at bedside Disposition Plan:Admit for acute cholecystitis Consults called:General surgery, IR, Nephrology Admission status: Inpatient, Tele  Severity of Illness: The appropriate patient status for this patient is INPATIENT. Inpatient status is judged to be reasonable and necessary in order to provide the required intensity of service to ensure the patient's safety. The patient's presenting symptoms, physical exam findings, and initial radiographic and laboratory data in the context of their chronic comorbidities is felt to place them at high risk for further clinical deterioration. Furthermore, it is not anticipated that the patient will be medically stable for discharge from the hospital within 2 midnights of admission.   * I certify that at the point of admission it is my clinical judgment that the patient will require inpatient hospital care spanning beyond 2 midnights from the point of admission due to high intensity of service, high risk for further deterioration and high frequency of surveillance  required.*   Baruc Tugwell D Chief Walkup DO [facility]  If 7PM-7AM, please contact night-coverage [email or web address]  08/19/2022, 9:50 AM

## 2022-08-19 NOTE — ED Notes (Addendum)
Per Heath Lark, DO, order to hold BP medications if systolic BP is less than 910.

## 2022-08-19 NOTE — ED Notes (Signed)
Pt transported to dialysis

## 2022-08-19 NOTE — ED Triage Notes (Signed)
Pt to ED from [facility] via EMS. C/o emesis and abdominal pain that started yesterday. Pt a new dialysis pt that is due for dialysis today or tomorrow.

## 2022-08-19 NOTE — Progress Notes (Signed)
[facility]  Lipase back at 925, so has some gallstone pancreatitis too in addition to concern for cholecystitis on CT.  Ordered US to evaluate the CBD better given this lipase elevation and known bilirubin elevation. Would still get IR for this cholecystostomy tube given the CT findings, but will make sure he does not need GI pending anything different on the [place].  Updated team.   [name], MD [facility] [address] [place], [place] [address] [phone] (office)

## 2022-08-19 NOTE — ED Notes (Signed)
Pharmacy notified Zosyn is not in stock in ED

## 2022-08-19 NOTE — ED Notes (Signed)
Patient transported to CT 

## 2022-08-19 NOTE — Consult Note (Signed)
[facility] Renal Consultation Note    Indication for Consultation:  Management of ESRD/hemodialysis; anemia, hypertension/volume and secondary hyperparathyroidism  HPI: [name] is a 76 y.o. male with a PMH significant for CAD, HTN, DM, GERD, chronic combined systolic and diastolic CHF (EF 50-35%, and new ESRD on HD MWF who presented to [facility] ED this morning with right sided abdominal pain associated with N/V.  In the ED, VSS, temp 100.3 and was ill-appearing.  Labs notable for AST 711, ALT 273, Alk phos 613, T bili 3.3, WBC 24.9, Hgb 9.1, INR 1.6.  CT scan of abdomen and pelvis consistent with acute cholecystitis.  He was admitted for IV antibiotics with plans to transfer to [facility] for IR to place cholecystostomy tube.  We were consulted to provide dialysis during his hospitalization.   Past Medical History:  Diagnosis Date   Anginal pain ([place])    Anxiety    Benign prostatic hypertrophy    Nocturia   CAD (coronary artery disease)    a.  NSTEMI 10/2012 s/p DES to LAD & DES to 1st diagonal with residual diffuse nonobstructive dz in LCx/RCA    Chronic kidney disease, stage 3, mod decreased GFR (HCC)    Creatinine of 1.5 in 03/2009, STAGE 4 now   Degenerative joint disease    Depression    hx of   Diabetes mellitus, type II ([place]) 46/56/8127   Diastolic heart failure    Pedal edema, "a long time ago"   Erectile dysfunction    Flu 2013   hx of   GERD (gastroesophageal reflux disease)    Gout    Hemorrhoids    High cholesterol    Hypertension    Exercise induced   Myocardial infarction ([place])    Neuropathy    bilateral legs   NSVT (nonsustained ventricular tachycardia) ([place])    Exercise induced   Obesity    Tobacco abuse, 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, quit 1994   Tubular adenoma 2014   Vertigo    everyday   Past Surgical History:  Procedure Laterality Date   BACK SURGERY  2007   x2   BIOPSY  02/05/2021   Procedure: BIOPSY;  Surgeon: Daneil Dolin, MD;   Location: [facility];  Service: Endoscopy;;   CARDIAC CATHETERIZATION     with stent placement   COLONOSCOPY WITH ESOPHAGOGASTRODUODENOSCOPY (EGD) N/A 11/23/2013   Dr. Oneida Alar- TCS= left sided colitis and mild proctitis likely due to ischemic colitis, moderate internal hemorrhoids, small nodule in the rectum, bx=tubular adenoma, EGD=-mild non-erosive gastritis   COLONOSCOPY WITH PROPOFOL N/A 07/24/2020   hemorrhoids,  rectosigmoid lipoma, small adenoma removed.   CORONARY ANGIOPLASTY     ESOPHAGOGASTRODUODENOSCOPY (EGD) WITH PROPOFOL N/A 02/05/2021   Normal esophagus, erythematous mucosa in entire stomach, no obvious ulcer, s/p biopsy. Normal duodenum. Negative H.pylori.    HERNIA REPAIR     INSERTION OF DIALYSIS CATHETER N/A 08/06/2022   Procedure: INSERTION OF DIALYSIS CATHETER;  Surgeon: Rosetta Posner, MD;  Location: [facility] ORS;  Service: Vascular;  Laterality: N/A;   JOINT REPLACEMENT     LEFT HEART CATHETERIZATION WITH CORONARY ANGIOGRAM N/A 11/16/2012   Procedure: LEFT HEART CATHETERIZATION WITH CORONARY ANGIOGRAM;  Surgeon: Sherren Mocha, MD;  Location: [facility] CATH LAB;  Service: Cardiovascular;  Laterality: N/A;   PERCUTANEOUS CORONARY STENT INTERVENTION (PCI-S)  11/16/2012   Procedure: PERCUTANEOUS CORONARY STENT INTERVENTION (PCI-S);  Surgeon: Sherren Mocha, MD;  Location: [facility] CATH LAB;  Service: Cardiovascular;;  POLYPECTOMY  07/24/2020   Procedure: POLYPECTOMY;  Surgeon: Daneil Dolin, MD;  Location[facility];  Service: Endoscopy;;   SPINAL CORD STIMULATOR INSERTION N/A 06/10/2022   Procedure: Spinal cord stimulator placement;  Surgeon: Newman Pies, MD;  Location: [place];  Service: Neurosurgery;  Laterality: N/A;  RM 19/ to follow   THUMB AMPUTATION Left    50 years ago   TOTAL KNEE ARTHROPLASTY Right 11/15/2013   Procedure: RIGHT TOTAL KNEE ARTHROPLASTY;  Surgeon: Mauri Pole, MD;  Location: [facility]S;  Service: Orthopedics;  Laterality: Right;   TOTAL KNEE ARTHROPLASTY  Left 03/07/2015   Procedure: LEFT TOTAL KNEE ARTHROPLASTY;  Surgeon: Paralee Cancel, MD;  Location: [facility] ORS;  Service: Orthopedics;  Laterality: Left;   Family History:   Family History  Problem Relation Age of Onset   Hypertension Other    Cancer Other    Heart disease Other    Diabetes Other    Hypertension Mother    Cancer Father        brain   CAD Brother    Colon cancer Neg Hx    Social History:  reports that he quit smoking about 29 years ago. His smoking use included cigarettes. He started smoking about 57 years ago. He has a 30.00 pack-year smoking history. He has never used smokeless tobacco. He reports that he does not drink alcohol and does not use drugs. Allergies  Allergen Reactions   Metformin Nausea And Vomiting   Niacin    Niacin And Related Other (See Comments)    Reaction: flushing and burning side effects even with extended release   Prior to Admission medications   Medication Sig Start Date End Date Taking? Authorizing Provider  acarbose (PRECOSE) 50 MG tablet Take 50 mg by mouth in the morning, at noon, and at bedtime.  10/20/17   [provider]  acetaminophen (TYLENOL) 500 MG tablet Take 2 tablets (1,000 mg total) by mouth every 6 (six) hours. 08/10/22   Shahmehdi, Valeria Batman, MD  allopurinol (ZYLOPRIM) 100 MG tablet Take 100 mg by mouth daily. 08/17/20   [provider]  amLODipine (NORVASC) 10 MG tablet Take 10 mg by mouth every evening.     [provider]  aspirin EC 81 MG tablet Take 1 tablet (81 mg total) by mouth daily. 09/14/19   Herminio Commons, MD  calcitRIOL (ROCALTROL) 0.25 MCG capsule Take 0.25 mcg by mouth every Sunday.  12/13/16   [provider]  carboxymethylcellulose (REFRESH PLUS) 0.5 % SOLN Place 1 drop into both eyes daily as needed (dry eyes).    [provider]  carvedilol (COREG) 6.25 MG tablet Take 6.25 mg by mouth 2 (two) times daily with a meal. 04/18/22 04/18/23  [provider]   cholecalciferol (VITAMIN D) 1000 units tablet Take 1,000 Units daily by mouth.    [provider]  cloNIDine (CATAPRES) 0.1 MG tablet Take 0.1 mg by mouth 3 (three) times daily. 02/15/20   [provider]  epoetin alfa (EPOGEN) 3000 UNIT/ML injection Inject into the skin. 07/03/22   [provider]  hydrALAZINE (APRESOLINE) 100 MG tablet Take 200 mg by mouth 2 (two) times daily. 01/24/22   [provider]  insulin NPH Human (NOVOLIN N) 100 UNIT/ML injection Inject 0.18 mLs (18 Units total) into the skin every morning. Ands syringes 1/day Patient taking differently: Inject 20 Units into the skin daily as needed (if CBG is high). Per sliding scale: not provided 06/27/21  Renato Shin, MD  isosorbide mononitrate (IMDUR) 60 MG 24 hr tablet Take 1 tablet (60 mg total) by mouth 2 (two) times daily. 06/26/22 06/21/23  Arnoldo Lenis, MD  mupirocin ointment (BACTROBAN) 2 % Apply 1 Application topically 3 (three) times daily. 07/10/22   [provider]  nitroGLYCERIN (NITROSTAT) 0.4 MG SL tablet Place 1 tablet (0.4 mg total) under the tongue every 5 (five) minutes as needed for chest pain. PLACE ONE TABLET UNDER THE TONGUE EVERY 5 MINUTES FOR 3 DOSES AS NEEDED Strength: 0.4 mg 02/07/22   Arnoldo Lenis, MD  polyethylene glycol (MIRALAX / GLYCOLAX) 17 g packet Take 17 g by mouth daily. 08/11/22   Shahmehdi, Valeria Batman, MD  pravastatin (PRAVACHOL) 40 MG tablet Take 1 tablet (40 mg total) by mouth every evening. 07/31/21 02/09/28  Imogene Burn, PA-C  RESTASIS 0.05 % ophthalmic emulsion 1 drop 2 (two) times daily. 06/19/22   [provider]  senna (SENOKOT) 8.6 MG TABS tablet Take 2 tablets (17.2 mg total) by mouth 2 (two) times daily. 08/10/22   Shahmehdi, Valeria Batman, MD  sitaGLIPtin (JANUVIA) 25 MG tablet     [provider]  TRUE METRIX BLOOD GLUCOSE TEST test strip USE TO TEST BLOOD SUGAR UP TO TWICE DAILY OR AS DIRECTED 08/31/21   Renato Shin, MD    Current Facility-Administered Medications  Medication Dose Route Frequency Provider Last Rate Last Admin   piperacillin-tazobactam (ZOSYN) IVPB 3.375 g  3.375 g Intravenous Once Noemi Chapel, MD       Current Outpatient Medications  Medication Sig Dispense Refill   acarbose (PRECOSE) 50 MG tablet Take 50 mg by mouth in the morning, at noon, and at bedtime.      acetaminophen (TYLENOL) 500 MG tablet Take 2 tablets (1,000 mg total) by mouth every 6 (six) hours. 30 tablet 0   allopurinol (ZYLOPRIM) 100 MG tablet Take 100 mg by mouth daily.     amLODipine (NORVASC) 10 MG tablet Take 10 mg by mouth every evening.      aspirin EC 81 MG tablet Take 1 tablet (81 mg total) by mouth daily. 90 tablet 3   calcitRIOL (ROCALTROL) 0.25 MCG capsule Take 0.25 mcg by mouth every Sunday.      carboxymethylcellulose (REFRESH PLUS) 0.5 % SOLN Place 1 drop into both eyes daily as needed (dry eyes).     carvedilol (COREG) 6.25 MG tablet Take 6.25 mg by mouth 2 (two) times daily with a meal.     cholecalciferol (VITAMIN D) 1000 units tablet Take 1,000 Units daily by mouth.     cloNIDine (CATAPRES) 0.1 MG tablet Take 0.1 mg by mouth 3 (three) times daily.     epoetin alfa (EPOGEN) 3000 UNIT/ML injection Inject into the skin.     hydrALAZINE (APRESOLINE) 100 MG tablet Take 200 mg by mouth 2 (two) times daily.     insulin NPH Human (NOVOLIN N) 100 UNIT/ML injection Inject 0.18 mLs (18 Units total) into the skin every morning. Ands syringes 1/day (Patient taking differently: Inject 20 Units into the skin daily as needed (if CBG is high). Per sliding scale: not provided) 10 mL 11   isosorbide mononitrate (IMDUR) 60 MG 24 hr tablet Take 1 tablet (60 mg total) by mouth 2 (two) times daily. 180 tablet 3   mupirocin ointment (BACTROBAN) 2 % Apply 1 Application topically 3 (three) times daily.     nitroGLYCERIN (NITROSTAT) 0.4 MG SL tablet Place 1 tablet (0.4 mg total) under the  tongue every 5 (five) minutes as needed for  chest pain. PLACE ONE TABLET UNDER THE TONGUE EVERY 5 MINUTES FOR 3 DOSES AS NEEDED Strength: 0.4 mg 25 tablet 3   polyethylene glycol (MIRALAX / GLYCOLAX) 17 g packet Take 17 g by mouth daily. 14 each 0   pravastatin (PRAVACHOL) 40 MG tablet Take 1 tablet (40 mg total) by mouth every evening. 90 tablet 3   RESTASIS 0.05 % ophthalmic emulsion 1 drop 2 (two) times daily.     senna (SENOKOT) 8.6 MG TABS tablet Take 2 tablets (17.2 mg total) by mouth 2 (two) times daily. 120 tablet 0   sitaGLIPtin (JANUVIA) 25 MG tablet      TRUE METRIX BLOOD GLUCOSE TEST test strip USE TO TEST BLOOD SUGAR UP TO TWICE DAILY OR AS DIRECTED 180 each 3   Labs: Basic Metabolic Panel: Recent Labs  Lab 08/19/22 0732  NA 135  K 4.3  CL 92*  CO2 27  GLUCOSE 133*  BUN 63*  CREATININE 4.90*  CALCIUM 9.2   Liver Function Tests: Recent Labs  Lab 08/19/22 0732  AST 711*  ALT 273*  ALKPHOS 613*  BILITOT 3.3*  PROT 8.2*  ALBUMIN 3.3*   No results for input(s): "LIPASE", "AMYLASE" in the last 168 hours. No results for input(s): "AMMONIA" in the last 168 hours. CBC: Recent Labs  Lab 08/19/22 0732  WBC 24.9*  NEUTROABS 20.9*  HGB 9.1*  HCT 28.2*  MCV 98.6  PLT 215   Cardiac Enzymes: No results for input(s): "CKTOTAL", "CKMB", "CKMBINDEX", "TROPONINI" in the last 168 hours. CBG: No results for input(s): "GLUCAP" in the last 168 hours. Iron Studies: No results for input(s): "IRON", "TIBC", "TRANSFERRIN", "FERRITIN" in the last 72 hours. Studies/Results: CT ABDOMEN PELVIS WO CONTRAST  Result Date: 08/19/2022 CLINICAL DATA:  emesis and abdominal pain since yesterday. EXAM: CT ABDOMEN AND PELVIS WITHOUT CONTRAST TECHNIQUE: Multidetector CT imaging of the abdomen and pelvis was performed following the standard protocol without IV contrast. RADIATION DOSE REDUCTION: This exam was performed according to the departmental dose-optimization program which includes automated exposure control, adjustment of the mA  and/or kV according to patient size and/or use of iterative reconstruction technique. COMPARISON:  CT May 22, 2021. FINDINGS: Lower chest: Scarring/atelectasis in the bilateral lung bases. Hepatobiliary: Scattered bilobar hypodense hepatic lesions are technically too small to accurately characterize an incompletely evaluated on this noncontrast examination but appears similar prior and statistically likely to reflect benign cysts or hemangiomas. Gallbladder is distended with pericholecystic fluid and gallbladder wall thickening consistent with cholecystitis. No biliary ductal dilation. Pancreas: No pancreatic ductal dilation or evidence of acute inflammation. Spleen: Normal in size without focal abnormality. Adrenals/Urinary Tract: Bilateral adrenal glands appear normal. No hydronephrosis. Bilateral hypodense renal lesions measure fluid density consistent with cysts and considered benign requiring no independent imaging follow-up. Additional bilateral hypodense renal lesions are technically too small to accurately characterize but statistically likely to reflect cysts and considered benign requiring no independent imaging follow-up. Urinary bladder is unremarkable for degree of distension. Stomach/Bowel: No radiopaque enteric contrast material was administered. Stomach is minimally distended limiting evaluation. No pathologic dilation of large or small bowel. Appendix is not confidently identified however there is no pericecal inflammation. Vascular/Lymphatic: Aortic atherosclerosis. No pathologically enlarged abdominal or pelvic lymph nodes. Reproductive: Prostate is mildly enlarged, unchanged. Other: No significant abdominopelvic free fluid. Musculoskeletal: Spinal stimulating generator in the posterior paramedian right lumbar spine soft tissues. Lumbosacral fixation hardware. Multilevel degenerative changes spine. IMPRESSION: 1. Acute cholecystitis,  without evidence of perforation. 2. No evidence of bowel  obstruction. 3.  Aortic Atherosclerosis (ICD10-I70.0). Electronically Signed   By: Dahlia Bailiff M.D.   On: 08/19/2022 08:17   DG Chest Port 1 View  Result Date: 08/19/2022 CLINICAL DATA:  Sepsis. EXAM: PORTABLE CHEST 1 VIEW COMPARISON:  August 06, 2022. FINDINGS: Stable cardiomegaly. Right internal jugular dialysis catheter is unchanged in position. Both lungs are clear. The visualized skeletal structures are unremarkable. IMPRESSION: No active disease. Electronically Signed   By: Marijo Conception M.D.   On: 08/19/2022 08:16    ROS: Pertinent items are noted in HPI. Physical Exam: Vitals:   08/19/22 0815 08/19/22 0830 08/19/22 0845 08/19/22 0900  BP: (!) 124/59 (!) 116/44 (!) 113/93 107/74  Pulse: 71 71 69 69  Resp: 19 17 (!) 24 17  Temp:      TempSrc:      SpO2: 90% 97% 95% 97%  Weight:      Height:          Weight change:  No intake or output data in the 24 hours ending 08/19/22 0933 BP 107/74   Pulse 69   Temp 100.3 F (37.9 C) (Rectal)   Resp 17   Ht 6' 2"  (1.88 m)   Wt 110 kg   SpO2 97%   BMI 31.14 kg/m  General appearance: fatigued, mild distress, and ill-appearing Head: Normocephalic, without obvious abnormality, atraumatic Resp: clear to auscultation bilaterally Cardio: regular rate and rhythm, S1, S2 normal, no murmur, click, rub or gallop GI: +BS, soft, + tenderness and guarding in RLQ, no rebound Extremities: edema 1+ bilateral ankle edema Dialysis Access:  Dialysis Orders: Center: [facility]  on MWF . EDW 103.5kg HD Bath 2K/2.5Ca  Time 4 hours Heparin none. Access RIJ TDC BFR 400 DFR 800     Venofer  100 mg IVP every HD through 09/09/22    Assessment/Plan:  Acute cholecystitis - too high risk for surgery and plan to transfer to [facility] for cholecystostomy tube and possible MRCP to r/o stones.  Currently started on IV antibiotics.  Avoid nephrotoxic medications including NSAIDs and iodinated intravenous contrast exposure unless the latter is absolutely indicated.    Preferred narcotic agents for pain control are hydromorphone, fentanyl, and methadone. Morphine should not be used.  Avoid Baclofen and avoid oral sodium phosphate and magnesium citrate based laxatives / bowel preps.  Continue strict Input and Output monitoring. Will monitor the patient closely with you and intervene or adjust therapy as indicated by changes in clinical status/labs   ESRD -  Following episode of AKI/CKD stage IV and has been dialysis dependent since 08/06/22.  Plan for HD on MWF schedule.  Likely will have HD at [facility]  Hypertension/volume  -  stable but Bp's on low side.  Anemia  - Continue with ESA and transfuse for Hgb <7 and IV iron.  Metabolic bone disease -  meds on hold as pt is npo  Nutrition - npo for now  Donetta Potts, MD [place] 08/19/2022, 9:33 AM

## 2022-08-19 NOTE — Procedures (Signed)
   HEMODIALYSIS TREATMENT NOTE:   3.5 hour heparin-free treatment completed using RIJ TDC - dressing changed; entry site unremarkable.  Max Qb 350 allowed for stable arterial pressures with lines reversed.  UF limited by hypotension (asymptomatic) despite Albumin 25g x1.  Net UF 497 ml.   HR irregular in 50s.  Meds given with HD:  Hydromorphone 0.59m at 1400 and Ondansetron 415mat 1515.  Transported back to ED to [place] No changes from pre-HD assessment. Hand-off given to MoIdelle CrouchRN.   AnRockwell AlexandriaRN

## 2022-08-20 ENCOUNTER — Inpatient Hospital Stay (HOSPITAL_COMMUNITY): Payer: Medicare Other

## 2022-08-20 DIAGNOSIS — K810 Acute cholecystitis: Secondary | ICD-10-CM | POA: Diagnosis not present

## 2022-08-20 HISTORY — PX: IR PERC CHOLECYSTOSTOMY: IMG2326

## 2022-08-20 LAB — COMPREHENSIVE METABOLIC PANEL
ALT: 252 U/L — ABNORMAL HIGH (ref 0–44)
AST: 402 U/L — ABNORMAL HIGH (ref 15–41)
Albumin: 2.8 g/dL — ABNORMAL LOW (ref 3.5–5.0)
Alkaline Phosphatase: 673 U/L — ABNORMAL HIGH (ref 38–126)
Anion gap: 17 — ABNORMAL HIGH (ref 5–15)
BUN: 31 mg/dL — ABNORMAL HIGH (ref 8–23)
CO2: 25 mmol/L (ref 22–32)
Calcium: 9 mg/dL (ref 8.9–10.3)
Chloride: 95 mmol/L — ABNORMAL LOW (ref 98–111)
Creatinine, Ser: 3.35 mg/dL — ABNORMAL HIGH (ref 0.61–1.24)
GFR, Estimated: 18 mL/min — ABNORMAL LOW (ref >60)
Glucose, Bld: 112 mg/dL — ABNORMAL HIGH (ref 70–99)
Potassium: 4.1 mmol/L (ref 3.5–5.1)
Sodium: 137 mmol/L (ref 135–145)
Total Bilirubin: 5.1 mg/dL — ABNORMAL HIGH (ref 0.3–1.2)
Total Protein: 7 g/dL (ref 6.5–8.1)

## 2022-08-20 LAB — LIPASE, BLOOD: Lipase: 136 U/L — ABNORMAL HIGH (ref 11–51)

## 2022-08-20 LAB — CBC
HCT: 27.6 % — ABNORMAL LOW (ref 39.0–52.0)
Hemoglobin: 9.3 g/dL — ABNORMAL LOW (ref 13.0–17.0)
MCH: 32 pg (ref 26.0–34.0)
MCHC: 33.7 g/dL (ref 30.0–36.0)
MCV: 94.8 fL (ref 80.0–100.0)
Platelets: 186 10*3/uL (ref 150–400)
RBC: 2.91 MIL/uL — ABNORMAL LOW (ref 4.22–5.81)
RDW: 15.9 % — ABNORMAL HIGH (ref 11.5–15.5)
WBC: 19.4 10*3/uL — ABNORMAL HIGH (ref 4.0–10.5)
nRBC: 0 % (ref 0.0–0.2)

## 2022-08-20 LAB — HEPATITIS B SURFACE ANTIBODY, QUANTITATIVE: Hep B S AB Quant (Post): <3.1 m[IU]/mL — ABNORMAL LOW (ref >9.9)

## 2022-08-20 LAB — MAGNESIUM: Magnesium: 1.9 mg/dL (ref 1.7–2.4)

## 2022-08-20 LAB — GLUCOSE, CAPILLARY: Glucose-Capillary: 121 mg/dL — ABNORMAL HIGH (ref 70–99)

## 2022-08-20 LAB — PROTIME-INR
INR: 1.5 — ABNORMAL HIGH (ref 0.8–1.2)
Prothrombin Time: 18.3 seconds — ABNORMAL HIGH (ref 11.4–15.2)

## 2022-08-20 MED ORDER — MIDAZOLAM HCL 2 MG/2ML IJ SOLN
INTRAMUSCULAR | Status: AC | PRN
  Administered 2022-08-20: 1 mg via INTRAVENOUS

## 2022-08-20 MED ORDER — LIDOCAINE-EPINEPHRINE 1 %-1:100000 IJ SOLN
INTRAMUSCULAR | Status: AC
  Filled 2022-08-20: qty 1

## 2022-08-20 MED ORDER — FENTANYL CITRATE (PF) 100 MCG/2ML IJ SOLN
INTRAMUSCULAR | Status: AC | PRN
  Administered 2022-08-20: 50 ug via INTRAVENOUS

## 2022-08-20 MED ORDER — IOHEXOL 300 MG/ML  SOLN
100.0000 mL | Freq: Once | INTRAMUSCULAR | Status: AC | PRN
  Administered 2022-08-20: 10 mL

## 2022-08-20 MED ORDER — ORAL CARE MOUTH RINSE: 15.0000 mL | OROMUCOSAL | Status: DC | PRN

## 2022-08-20 MED ORDER — FENTANYL CITRATE (PF) 100 MCG/2ML IJ SOLN
INTRAMUSCULAR | Status: AC
  Filled 2022-08-20: qty 2

## 2022-08-20 MED ORDER — MIDAZOLAM HCL 2 MG/2ML IJ SOLN
INTRAMUSCULAR | Status: AC
  Filled 2022-08-20: qty 2

## 2022-08-20 MED ORDER — PIPERACILLIN-TAZOBACTAM IN DEX 2-0.25 GM/50ML IV SOLN
2.2500 g | Freq: Three times a day (TID) | INTRAVENOUS | Status: DC
  Administered 2022-08-20 – 2022-08-22 (×7): 2.25 g via INTRAVENOUS
  Filled 2022-08-20 (×9): qty 50

## 2022-08-20 NOTE — Progress Notes (Signed)
Mobility Specialist - Progress Note   08/20/22 1055  Mobility  Activity Dangled on edge of bed  Level of Assistance Minimal assist, patient does 75% or more  Assistive Device None  Distance Ambulated (ft) 0 ft  Activity Response Tolerated fair  $Mobility charge 1 Mobility    Pt received in bed and agreeable to mobility. MinA required to help move legs to EOB. C/o nausea once seated EOB, declining further mobility. Left in bed w/ call bell in reach and all needs met.   Paulla Dolly Mobility Specialist

## 2022-08-20 NOTE — Progress Notes (Signed)
[facility]  I had ordered an ultrasound yesterday to ensure no choledocholithiasis given his lab work and elevated lipase . Ultrasound was unable to be completed at [facility] before transfer due to dialysis. Ultrasound is positive for cholecystitis and negative for choledocholithiasis.  Would continue with IR guided Chole tube given comorbidities and recent hospitalization.   I can follow up in Sep tember.   [name] MD

## 2022-08-20 NOTE — Progress Notes (Signed)
OTF to IR

## 2022-08-20 NOTE — Progress Notes (Signed)
Patient back to room 6N21. Denies any pain at this moment. VS checked; stable.

## 2022-08-20 NOTE — Consult Note (Signed)
   [facility] Inpatient Consult   08/20/2022  [name] 1946/12/10 [id number]  . [facility] [[facility]] Patient: [name]   Primary Care Provider: Redmond School, MD with [facility], is an [place] provider with a Care Management team and program and is listed for the [facility] follow up needs    *Referral request for [facility] Readmission Report  Patient screened for less than 30 days readmission hospitalization with noted extreme high risk score for unplanned readmission risk.  Reviewed to assess for potential needs for post hospital transition for readmission prevention needs.       Plan:  Continue to follow progress and disposition to assess for post hospital care management needs.  Referral request for Readmission Report to be sent.   For questions contact:    Natividad Brood, RN BSN [facility] Liaison  [phone] business mobile phone Toll free office [phone]  Fax number: [phone] [email or web address]

## 2022-08-20 NOTE — Consult Note (Signed)
Chief Complaint: Patient was seen in consultation today for percutaneous cholecystostomy drain placement at the request of Dr Hayes Ludwig   Supervising Physician: Ruthann Cancer  Patient Status: [facility] - In-pt  History of Present Illness: [name] is a 76 y.o. male   Hx ESRD; CAD; CHF; DM; HTN; HLD; GERD Was discharged recently from hospital with chest pain and volume overload Was doing well and placed at SNF Developed abd pain; N/V 3-4 days Eating making pain worse Sent to ED [facility] Transferred to [facility] after evaluation revealing cholecystitis Leukocytosis; elevated LFTS; elevated Bilirubin; elevated alkaline phos  CT: IMPRESSION: 1. Acute cholecystitis, without evidence of perforation. 2. No evidence of bowel obstruction. 3.  Aortic Atherosclerosis (ICD10-I70.0).   [place]: IMPRESSION: Cholelithiasis with suspected acute cholecystitis. No intrahepatic or extrahepatic ductal dilatation. Common duct measures 4 mm.  Dr Constance Haw requesting percutaneous cholecystostomy drain placement 8/28 note:Assessment & Plan:  [name] is a 76 y.o. male with acute cholecystitis based on exam, CT and LFTs. Bilirubin up a little, would trend. [place] not indicated currently but if Bilirubin continued to go up with get [place] or MRCP to ensure no choledocholithiasis. Lipase ordered as add on today.    Discussed with patient, friend [name] and brother, that given all of his recent issues and comorbidities, a general anesthesia would be dangerous and recommend antibiotics and cholecystostomy tube for his acute cholecystitis at this time. Discussed that this may be permanent if he does not get better or if cardiology ultimately says he is high risk candidate for surgery versus that we can evaluated to get it out in a few months once things have resolved and his dialysis is more stable etc.    All questions were answered to the satisfaction of the patient and family. Discussed with Dr. Manuella Ghazi and Dr. Sabra Heck.     Can follow up with me in a month after the cholecystostomy tube to see how he is and if we can work towards getting him evaluated to see if it can come out or stay.   Dr Vernard Gambles has approved procedure Scheduled today for same    Past Medical History:  Diagnosis Date   Anginal pain ([place])    Anxiety    Benign prostatic hypertrophy    Nocturia   CAD (coronary artery disease)    a.  NSTEMI 10/2012 s/p DES to LAD & DES to 1st diagonal with residual diffuse nonobstructive dz in LCx/RCA    Chronic kidney disease, stage 3, mod decreased GFR (HCC)    Creatinine of 1.5 in 03/2009, STAGE 4 now   Degenerative joint disease    Depression    hx of   Diabetes mellitus, type II ([place]) 65/99/3570   Diastolic heart failure    Pedal edema, "a long time ago"   Erectile dysfunction    Flu 2013   hx of   GERD (gastroesophageal reflux disease)    Gout    Hemorrhoids    High cholesterol    Hypertension    Exercise induced   Myocardial infarction ([place])    Neuropathy    bilateral legs   NSVT (nonsustained ventricular tachycardia) ([place])    Exercise induced   Obesity    Tobacco abuse, 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 Oneida Alar- TCS= left sided colitis and mild proctitis likely due to ischemic colitis, moderate internal hemorrhoids, small nodule in the rectum, bx=tubular adenoma, EGD=-mild non-erosive gastritis   COLONOSCOPY WITH PROPOFOL N/A 07/24/2020   hemorrhoids,  rectosigmoid lipoma, small adenoma removed.   CORONARY ANGIOPLASTY     ESOPHAGOGASTRODUODENOSCOPY (EGD) WITH PROPOFOL N/A 02/05/2021   Normal esophagus,  erythematous mucosa in entire stomach, no obvious ulcer, s/p biopsy. Normal duodenum. Negative H.pylori.    HERNIA REPAIR     INSERTION OF DIALYSIS CATHETER N/A 08/06/2022   Procedure: INSERTION OF DIALYSIS CATHETER;  Surgeon: Rosetta Posner, MD;  Location: [facility];  Service: Vascular;  Laterality: N/A;   JOINT REPLACEMENT     LEFT HEART CATHETERIZATION WITH CORONARY ANGIOGRAM N/A 11/16/2012   Procedure: LEFT HEART CATHETERIZATION WITH CORONARY ANGIOGRAM;  Surgeon: Sherren Mocha, MD;  Location: [facility] CATH LAB;  Service: Cardiovascular;  Laterality: N/A;   PERCUTANEOUS CORONARY STENT INTERVENTION (PCI-S)  11/16/2012   Procedure: PERCUTANEOUS CORONARY STENT INTERVENTION (PCI-S);  Surgeon: Sherren Mocha, MD;  Location: [facility] CATH LAB;  Service: Cardiovascular;;   POLYPECTOMY  07/24/2020   Procedure: POLYPECTOMY;  Surgeon: Daneil Dolin, MD;  Location: [facility];  Service: Endoscopy;;   SPINAL CORD STIMULATOR INSERTION N/A 06/10/2022   Procedure: Spinal cord stimulator placement;  Surgeon: Newman Pies, MD;  Location: [place];  Service: Neurosurgery;  Laterality: N/A;  RM 19/ to follow   THUMB AMPUTATION Left    50 years ago   TOTAL KNEE ARTHROPLASTY Right 11/15/2013   Procedure: RIGHT TOTAL KNEE ARTHROPLASTY;  Surgeon: Mauri Pole, MD;  Location: [facility];  Service: Orthopedics;  Laterality: Right;   TOTAL KNEE ARTHROPLASTY Left 03/07/2015   Procedure: LEFT TOTAL KNEE ARTHROPLASTY;  Surgeon: Paralee Cancel, MD;  Location: [facility] OR[facility];  Service: Orthopedics;  Laterality: Left;    Allergies: Metformin, Niacin, and Niacin and related  Medications: Prior to Admission medications   Medication Sig Start Date End Date Taking? Authorizing Provider  acarbose (PRECOSE) 50 MG tablet Take 50 mg by mouth in the morning, at noon, and at bedtime.  10/20/17  Yes [provider]  acetaminophen (TYLENOL) 500 MG tablet Take 2 tablets (1,000 mg total) by mouth every 6 (six) hours. 08/10/22  Yes Shahmehdi,  Seyed A, MD  allopurinol (ZYLOPRIM) 100 MG tablet Take 100 mg by mouth daily. 08/17/20  Yes [provider]  amLODipine (NORVASC) 10 MG tablet Take 10 mg by mouth every evening.    Yes [provider]  aspirin EC 81 MG tablet Take 1 tablet (81 mg total) by mouth daily. 09/14/19  Yes Herminio Commons, MD  calcitRIOL (ROCALTROL) 0.25 MCG capsule Take 0.25 mcg by mouth every Sunday.  12/13/16  Yes [provider]  carboxymethylcellulose (REFRESH PLUS) 0.5 % SOLN Place 1 drop into both eyes daily as needed (dry eyes).   Yes [provider]  carvedilol (COREG) 6.25 MG tablet Take 6.25 mg by mouth 2 (two) times daily with a meal. 04/18/22 04/18/23 Yes [provider]  cholecalciferol (VITAMIN D) 1000 units tablet Take 1,000 Units daily by mouth.   Yes [provider]  cloNIDine (CATAPRES) 0.1 MG tablet Take 0.1 mg by mouth 3 (three) times daily. 02/15/20  Yes [provider]  insulin NPH Human (NOVOLIN N) 100 UNIT/ML injection Inject 0.18 mLs (18 Units total) into the skin every morning. Ands syringes 1/day Patient taking differently: Inject 18 Units into the skin daily. 06/27/21  Yes Renato Shin, MD  isosorbide mononitrate (IMDUR) 60 MG 24 hr tablet Take 1 tablet (60 mg total) by mouth 2 (two) times daily. 06/26/22 06/21/23 Yes Branch, Alphonse Guild, MD  polyethylene glycol (MIRALAX / GLYCOLAX) 17 g packet Take 17 g by mouth daily. 08/11/22  Yes Shahmehdi, Seyed A, MD  pravastatin (PRAVACHOL) 40 MG tablet Take 1 tablet (40 mg total) by mouth every evening. 07/31/21 02/09/28 Yes Imogene Burn, PA-C  RESTASIS 0.05 % ophthalmic emulsion Place 1 drop into both eyes 2 (two) times daily. 06/19/22  Yes [provider]  senna (SENOKOT) 8.6 MG TABS tablet Take 2 tablets (17.2 mg total) by mouth 2 (two) times daily. 08/10/22  Yes Shahmehdi, Seyed A, MD  sitaGLIPtin (JANUVIA) 25 MG tablet Take 25 mg by mouth daily.   Yes [provider]   traMADol (ULTRAM) 50 MG tablet Take 50 mg by mouth 4 (four) times daily as needed for pain. 08/14/22  Yes [provider]  epoetin alfa (EPOGEN) 3000 UNIT/ML injection Inject into the skin. 07/03/22   [provider]  nitroGLYCERIN (NITROSTAT) 0.4 MG SL tablet Place 1 tablet (0.4 mg total) under the tongue every 5 (five) minutes as needed for chest pain. PLACE ONE TABLET UNDER THE TONGUE EVERY 5 MINUTES FOR 3 DOSES AS NEEDED Strength: 0.4 mg 02/07/22   Arnoldo Lenis, MD  TRUE METRIX BLOOD GLUCOSE TEST test strip USE TO TEST BLOOD SUGAR UP TO TWICE DAILY OR AS DIRECTED 08/31/21   Renato Shin, MD     Family History  Problem Relation Age of Onset   Hypertension Other    Cancer Other    Heart disease Other    Diabetes Other    Hypertension Mother    Cancer Father        brain   CAD Brother    Colon cancer Neg Hx     Social History   Socioeconomic History   Marital status: Widowed    Spouse name: Not on file   Number of children: Not on file   Years of education: Not on file   Highest education level: Not on file  Occupational History   Occupation: Disabled    Employer: RETIRED  Tobacco Use   Smoking status: Former    Packs/day: 1.00    Years: 30.00    Total pack years: 30.00    Types: Cigarettes    Start date: 01/22/1965    Quit date: 12/23/1992    Years since quitting: 29.6   Smokeless tobacco: Never  Vaping Use   Vaping Use: Never used  Substance and Sexual Activity   Alcohol use: No    Alcohol/week: 0.0 standard drinks of alcohol   Drug use: No   Sexual activity: Not Currently  Other Topics Concern   Not on file  Social History Narrative   Married   Right handed   One story home no caffeine   Social Determinants of Health   Financial Resource Strain: Not on file  Food Insecurity: Not on file  Transportation Needs: Not on file  Physical Activity: Not on file  Stress: Not on file  Social Connections: Not on file    Review of Systems: A  12 point ROS discussed and pertinent positives are indicated in the HPI above.  All other systems are negative.  Review of Systems  Constitutional:  Positive for activity change, appetite change, fatigue and fever.  Respiratory:  Negative for cough and shortness of breath.   Cardiovascular:  Negative for chest pain.  Gastrointestinal:  Positive for abdominal pain, nausea and vomiting.  Neurological:  Positive for weakness.  Psychiatric/Behavioral:  Negative for behavioral problems and confusion.     Vital Signs: BP (!) 128/44 (BP Location: Left Arm)   Pulse 62   Temp 99.9 F (37.7 C) (Oral)   Resp 18   Ht 6' 2"  (1.88 m)   Wt 242 lb 8.1 oz (110 kg)   SpO2 94%   BMI 31.14 kg/m     Physical Exam Vitals reviewed.  HENT:     Mouth/Throat:     Mouth: Mucous membranes are moist.  Cardiovascular:     Rate and Rhythm: Normal rate and regular rhythm.     Heart sounds: Normal heart sounds.  Pulmonary:     Effort: Pulmonary effort is normal.     Breath sounds: Normal breath sounds. No wheezing.  Abdominal:     Palpations: Abdomen is soft.     Tenderness: There is abdominal tenderness.  Musculoskeletal:        General: Normal range of motion.  Skin:    General: Skin is warm.  Neurological:     Mental Status: He is alert and oriented to person, place, and time.     Comments: Although oriented x 4 Pt is weak and unable to legibly sign consent Consented with Steffanie Dunn via phone     Imaging: US Abdomen Limited RUQ (LIVER/GB)  Result Date: 08/20/2022 CLINICAL DATA:  Choledocholithiasis EXAM: ULTRASOUND ABDOMEN LIMITED RIGHT UPPER QUADRANT COMPARISON:  CT abdomen/pelvis dated 08/19/2022 FINDINGS: Gallbladder: Cholelithiasis with distension, gallbladder wall thickening, pericholecystic fluid, and positive sonographic Murphy's sign. Common bile duct: Diameter: 4 mm Liver: The upper limits of normal for parenchymal echogenicity. 9 mm hyperechoic lesion in the anterior right  hepatic lobe, possibly reflecting a small hemangioma portal vein is patent on color Doppler imaging with normal direction of blood flow towards the liver. Other: None. IMPRESSION: Cholelithiasis with suspected acute cholecystitis. No intrahepatic or extrahepatic ductal dilatation. Common duct measures 4 mm. Electronically Signed   By: Julian Hy M.D.   On: 08/20/2022 01:33   CT ABDOMEN PELVIS WO CONTRAST  Result Date: 08/19/2022 CLINICAL DATA:  emesis and abdominal pain since yesterday. EXAM: CT ABDOMEN AND PELVIS WITHOUT CONTRAST TECHNIQUE: Multidetector CT imaging of the abdomen and pelvis was performed following the standard protocol without IV contrast. RADIATION DOSE REDUCTION: This exam was performed according to the departmental dose-optimization program which includes automated exposure control, adjustment of the mA and/or kV according to patient size and/or use of iterative reconstruction technique. COMPARISON:  CT May 22, 2021. FINDINGS: Lower chest: Scarring/atelectasis in the bilateral lung bases. Hepatobiliary: Scattered bilobar hypodense hepatic lesions are technically too small to accurately characterize an incompletely evaluated on this noncontrast examination but appears similar prior and statistically likely to reflect benign cysts or hemangiomas. Gallbladder is distended with pericholecystic fluid and gallbladder wall thickening consistent with cholecystitis. No biliary ductal dilation. Pancreas: No pancreatic ductal dilation or evidence of acute inflammation. Spleen: Normal in size without focal abnormality. Adrenals/Urinary Tract: Bilateral adrenal glands appear normal. No hydronephrosis. Bilateral hypodense renal lesions measure fluid density consistent with cysts and considered benign requiring no independent imaging follow-up. Additional bilateral hypodense renal lesions are technically too small to accurately characterize but statistically  likely to reflect cysts and considered  benign requiring no independent imaging follow-up. Urinary bladder is unremarkable for degree of distension. Stomach/Bowel: No radiopaque enteric contrast material was administered. Stomach is minimally distended limiting evaluation. No pathologic dilation of large or small bowel. Appendix is not confidently identified however there is no pericecal inflammation. Vascular/Lymphatic: Aortic atherosclerosis. No pathologically enlarged abdominal or pelvic lymph nodes. Reproductive: Prostate is mildly enlarged, unchanged. Other: No significant abdominopelvic free fluid. Musculoskeletal: Spinal stimulating generator in the posterior paramedian right lumbar spine soft tissues. Lumbosacral fixation hardware. Multilevel degenerative changes spine. IMPRESSION: 1. Acute cholecystitis, without evidence of perforation. 2. No evidence of bowel obstruction. 3.  Aortic Atherosclerosis (ICD10-I70.0). Electronically Signed   By: Dahlia Bailiff M.D.   On: 08/19/2022 08:17   DG Chest Port 1 View  Result Date: 08/19/2022 CLINICAL DATA:  Sepsis. EXAM: PORTABLE CHEST 1 VIEW COMPARISON:  August 06, 2022. FINDINGS: Stable cardiomegaly. Right internal jugular dialysis catheter is unchanged in position. Both lungs are clear. The visualized skeletal structures are unremarkable. IMPRESSION: No active disease. Electronically Signed   By: Marijo Conception M.D.   On: 08/19/2022 08:16   DG Abd 1 View  Result Date: 08/07/2022 CLINICAL DATA:  76 year old male with history of abdominal pain. EXAM: ABDOMEN - 1 VIEW COMPARISON:  05/22/2021, 03/15/2015 FINDINGS: Diffuse intraluminal gas without evidence of significant distension. Moderate colonic stool burden. No air-fluid levels. Spinal stimulator in place. Status post lumbar fusion. IMPRESSION: Nonspecific, nonobstructive bowel gas pattern. Moderate colonic stool burden. Electronically Signed   By: Ruthann Cancer M.D.   On: 08/07/2022 09:36   DG Chest Portable 1 View  Result Date:  08/06/2022 CLINICAL DATA:  Line placement, dialysis catheter EXAM: PORTABLE CHEST 1 VIEW COMPARISON:  07/31/2022 FINDINGS: Cardiomegaly. Large-bore right neck multi lumen vascular catheter, tip positioned near the superior cavoatrial junction. Both lungs are clear. The visualized skeletal structures are unremarkable. IMPRESSION: 1. Cardiomegaly without acute abnormality of the lungs in [facility] portable projection. 2. Large-bore right neck multi lumen vascular catheter, tip positioned near the superior cavoatrial junction. Electronically Signed   By: Delanna Ahmadi M.D.   On: 08/06/2022 08:38   DG C-Arm 1-60 Min-No Report  Result Date: 08/06/2022 Fluoroscopy was utilized by the requesting physician.  No radiographic interpretation.   DG CHEST PORT 1 VIEW  Result Date: 07/31/2022 CLINICAL DATA:  76 year old male status post endotracheal tube placement. Heart failure. EXAM: PORTABLE CHEST 1 VIEW COMPARISON:  Chest x-ray 07/31/2022. FINDINGS: An endotracheal tube is in place with tip 5.3 cm above the carina. A nasogastric tube is seen extending into the stomach, however, the tip of the nasogastric tube extends below the lower margin of the image. Lung volumes are low. Bibasilar opacities may reflect areas of atelectasis and/or consolidation, potentially with superimposed small left pleural effusion. No definite right pleural effusion. Ill-defined opacities and areas of interstitial prominence are also noted throughout the mid to lower lungs bilaterally. Crowding and cephalization of the pulmonary vasculature. No pneumothorax. Heart size is mildly enlarged. Upper mediastinal contours are within normal limits. IMPRESSION: 1. Support apparatus, as above. 2. The appearance the chest may suggest mild congestive heart failure. In addition, there are bibasilar opacities favored to reflect areas of subsegmental atelectasis, with superimposed small left pleural effusion. Electronically Signed   By: Vinnie Langton M.D.   On:  07/31/2022 05:18   DG Chest Portable 1 View  Result Date: 07/31/2022 CLINICAL DATA:  Endotracheal tube malposition EXAM: PORTABLE CHEST 1 VIEW COMPARISON:  None  Available. FINDINGS: The endotracheal tube is seen above the level of the clavicular heads 10.9 cm above the carina. The balloon appears hyperinflated within the hypopharynx. Nasogastric tube extends into the upper abdomen beyond the margin of the examination. Pulmonary insufflation is normal and stable since prior examination. No pneumothorax or pleural effusion. Mild cardiomegaly is stable. Superimposed bilateral perihilar pulmonary infiltrates appear improved in the interval since prior examination, infection versus edema. IMPRESSION: 1. High position of the endotracheal tube. The balloon appears hyperinflated within the hypopharynx. Advancement of the endotracheal tube by 5-6 cm would better position the device. 2. Improving bilateral perihilar pulmonary infiltrates, infection versus edema. 3. Stable cardiomegaly. Electronically Signed   By: Fidela Salisbury M.D.   On: 07/31/2022 04:37   DG Chest Port 1 View  Addendum Date: 07/30/2022   ADDENDUM REPORT: 07/30/2022 08:00 ADDENDUM: Additional image done at 3:30 p.m. on 07/29/2022 is submitted for review. Tip of NG tube is seen in the fundus of the stomach. Side port an NG tube is noted close to the gastroesophageal junction. Electronically Signed   By: Elmer Picker M.D.   On: 07/30/2022 08:00   Result Date: 07/30/2022 CLINICAL DATA:  Placement of endotracheal tube and orogastric tube EXAM: PORTABLE CHEST 1 VIEW COMPARISON:  Previous studies including the examination of 07/28/2022 FINDINGS: Transverse diameter heart is increased. There is interval worsening of pulmonary vascular congestion. Interval appearance of extensive alveolar densities are seen in the parahilar regions and lower lung fields, more so on the left side. Lateral CP angles are indistinct. There is no pneumothorax. Tip of  endotracheal tube is 6.6 cm above the carina. NG tube is not visualized in chest. IMPRESSION: Cardiomegaly. There is interval worsening of pulmonary vascular congestion. Extensive alveolar infiltrates are seen in both lungs suggesting pulmonary edema or multifocal pneumonia with interval worsening. NG tube is not visualized and may be coiled within the mouth or oropharynx. Electronically Signed: By: Elmer Picker M.D. On: 07/29/2022 15:46   DG Chest Port 1 View  Result Date: 07/30/2022 CLINICAL DATA:  Chest pain and dyspnea. EXAM: PORTABLE CHEST 1 VIEW COMPARISON:  Portable chest 07/29/2022 FINDINGS: 4:55 a.m. ETT tip is 6.2 cm from the carina. Interval insertion NGT with the tip in the body of stomach. There is spinal stimulator wiring in the distal thoracic spinal canal. The heart is enlarged. The aorta is tortuous and ectatic with stable mediastinum. Aortic atherosclerosis. There is persistent perihilar vascular congestion and extensive bilateral perihilar infiltrates, more confluent on the left, with bilateral peripheral sparing and small pleural effusions. No new or worsening opacities seen. Overall aeration is generally stable. IMPRESSION: Persistent left-greater-than-right perihilar infiltrates with cardiomegaly and central vascular engorgement. Findings could be due to alveolar pulmonary edema, pneumonia or combination. Stable overall aeration noted today with no new or worsening infiltrates. Small pleural effusions appear similar. NGT tip in the body of stomach with stable ETT positioning. Aortic atherosclerosis. Electronically Signed   By: Telford Nab M.D.   On: 07/30/2022 07:21   ECHOCARDIOGRAM LIMITED  Result Date: 07/29/2022    ECHOCARDIOGRAM LIMITED REPORT   Patient Name:   [name] Date of Exam: 07/29/2022 Medical Rec #:  [id number]       Height:       74.0 in Accession #:    [id number]      Weight:       270.7 lb Date of Birth:  1946-05-20       BSA:  2.472 m Patient Age:     25 years        BP:           168/79 mmHg Patient Gender: M               HR:           80 bpm. Exam Location:  [facility] Procedure: Limited Echo and Intracardiac Opacification Agent Indications:    CHF  History:        Patient has prior history of Echocardiogram examinations, most                 recent 02/07/2022. CHF, CAD and Previous Myocardial Infarction,                 Arrythmias:PVC, Signs/Symptoms:Chest Pain; Risk                 Factors:Hypertension, Diabetes and Dyslipidemia. Patient on                 Bi-Pap.  Sonographer:    Wenda Low Referring Phys: [phone] Nadean Corwin HILTY  Sonographer Comments: Patient is morbidly obese. IMPRESSIONS  1. Limited echo for LVEF and regional wall motion.  2. No apical thrombus by Definity contrast. Left ventricular ejection fraction, by estimation, is 35 to 40%. Left ventricular ejection fraction by 2D MOD biplane is 36.6 %. The left ventricle has moderately decreased function. The left ventricle demonstrates severe global hypokinesis, worse at the apex. Regional wall motion is difficult to ascertain given frequent PVC's.  3. The inferior vena cava is dilated in size with <50% respiratory variability, suggesting right atrial pressure of 15 mmHg.  4. Right ventricular systolic function was not well visualized. The right ventricular size is not well visualized.  5. Rhythm strip during this exam demonstrates atrial fibrillation. Comparison(s): Changes from prior study are noted. 02/07/2022: LVEF 60-65%, no regional WMA's. FINDINGS  Left Ventricle: No apical thrombus by Definity contrast. Left ventricular ejection fraction, by estimation, is 35 to 40%. Left ventricular ejection fraction by 2D MOD biplane is 36.6 %. The left ventricle has moderately decreased function. The left ventricle demonstrates global hypokinesis. Definity contrast agent was given IV to delineate the left ventricular endocardial borders. The left ventricular internal cavity size was normal in size.  There is moderate left ventricular hypertrophy. Right Ventricle: The right ventricular size is not well visualized. Right vetricular wall thickness was not well visualized. Right ventricular systolic function was not well visualized. Aorta: The aortic root and ascending aorta are structurally normal, with no evidence of dilitation. Venous: The inferior vena cava is dilated in size with less than 50% respiratory variability, suggesting right atrial pressure of 15 mmHg. EKG: Rhythm strip during this exam demonstrates atrial fibrillation. LEFT VENTRICLE PLAX 2D                        Biplane EF (MOD) LVIDd:         6.80 cm         LV Biplane EF:   Left LVIDs:         4.60 cm                          ventricular LV PW:         1.40 cm  ejection LV IVS:        1.50 cm                          fraction by LVOT diam:     2.00 cm                          2D MOD LVOT Area:     3.14 cm                         biplane is                                                 36.6 %.  LV Volumes (MOD) LV vol d, MOD    122.0 ml A2C: LV vol d, MOD    128.0 ml A4C: LV vol s, MOD    74.4 ml A2C: LV vol s, MOD    89.6 ml A4C: LV SV MOD A2C:   47.6 ml LV SV MOD A4C:   128.0 ml LV SV MOD BP:    49.2 ml LEFT ATRIUM         Index LA diam:    5.50 cm 2.22 cm/m   AORTA Ao Root diam: 3.30 cm  SHUNTS Systemic Diam: 2.00 cm Lyman Bishop MD Electronically signed by Lyman Bishop MD Signature Date/Time: 07/29/2022/4:28:09 PM    Final    DG Chest Portable 1 View  Result Date: 07/28/2022 CLINICAL DATA:  Shortness of breath, chest pain EXAM: PORTABLE CHEST 1 VIEW COMPARISON:  05/29/2022 FINDINGS: Cardiomegaly, vascular congestion. No confluent opacities, effusions or edema. No acute bony abnormality. IMPRESSION: Cardiomegaly, vascular congestion. Electronically Signed   By: Rolm Baptise M.D.   On: 07/28/2022 23:46    Labs:  CBC: Recent Labs    08/09/22 0511 08/10/22 0602 08/19/22 0732 08/20/22 0114  WBC 9.2 9.4  24.9* 19.4*  HGB 8.7* 8.2* 9.1* 9.3*  HCT 28.1* 25.6* 28.2* 27.6*  PLT 341 320 215 186    COAGS: Recent Labs    02/06/22 1723 05/10/22 0340 08/19/22 0732 08/20/22 0637  INR 1.0  --  1.6* 1.5*  APTT  --  30 38*  --     BMP: Recent Labs    08/08/22 0425 08/09/22 0511 08/19/22 0732 08/20/22 0114  NA 146*  147* 141 135 137  K 4.5  4.5 4.0 4.3 4.1  CL 107  107 103 92* 95*  CO2 29  30 30 27 25   GLUCOSE 157*  158* 146* 133* 112*  BUN 96*  96* 62* 63* 31*  CALCIUM 9.4  9.6 9.3 9.2 9.0  CREATININE 3.11*  3.05* 2.66* 4.90* 3.35*  GFRNONAA 20*  20* 24* 12* 18*    LIVER FUNCTION TESTS: Recent Labs    07/28/22 2328 08/03/22 1131 08/04/22 0528 08/05/22 0507 08/07/22 0515 08/08/22 0425 08/19/22 0732 08/20/22 0114  BILITOT 0.4  --  0.6  --   --   --  3.3* 5.1*  AST 41  --  14*  --   --   --  711* 402*  ALT 39  --  12  --   --   --  273* 252*  ALKPHOS 59  --  54  --   --   --  613* 673*  PROT 7.6  --  6.9  --   --   --  8.2* 7.0  ALBUMIN 4.2   < > 2.7*   < > 2.9* 2.9* 3.3* 2.8*   < > = values in this interval not displayed.    TUMOR MARKERS: No results for input(s): "AFPTM", "CEA", "CA199", "CHROMGRNA" in the last 8760 hours.  Assessment and Plan:  Scheduled for percutaneous cholecystostomy drain placement Risks and benefits discussed with the patient and his brother [name] via phone including, but not limited to bleeding, infection, gallbladder perforation, bile leak, sepsis or even death.  All questions were answered, patient and [name] are agreeable to proceed. Consent signed and in chart.   Thank you for this interesting consult.  I greatly enjoyed meeting [name] and look forward to participating in their care.  A copy of this report was sent to the requesting provider on this date.  Electronically Signed: Lavonia Drafts, PA-C 08/20/2022, 7:24 AM   I spent a total of 40 Minutes    in face to face in clinical consultation, greater than 50% of  which was counseling/coordinating care for perc chole drain

## 2022-08-20 NOTE — Progress Notes (Signed)
[name] Progress Note   76 y.o. 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  In the ED, VSS, temp 100.3 and was ill-appearing.  Labs notable for AST 711, ALT 273, Alk phos 613, T bili 3.3, WBC 24.9, Hgb 9.1, INR 1.6.  CT scan of abdomen and pelvis consistent with acute cholecystitis.  He was admitted for IV antibiotics with plans to transfer to [facility] for IR to place cholecystostomy tube.    Dialysis Orders: Center: [facility]  on MWF . EDW 103.5kg HD Bath 2K/2.5Ca  Time 4 hours Heparin none. Access RIJ TDC BFR 400 DFR 800     Venofer  100 mg IVP every HD through 09/09/22   Assessment/ Plan:    Acute cholecystitis - too high risk for surgery and plan for cholecystostomy tube and possible MRCP to r/o stones.  U/S neg for choledocholithiasis. Currently started on IV antibiotics.  Avoid nephrotoxic medications including NSAIDs and iodinated intravenous contrast exposure unless the latter is absolutely indicated.   Preferred narcotic agents for pain control are hydromorphone, fentanyl, and methadone. Morphine should not be used.  Avoid Baclofen and avoid oral sodium phosphate and magnesium citrate based laxatives / bowel preps.  Continue strict Input and Output monitoring. Will monitor the patient closely with you and intervene or adjust therapy as indicated by changes in clinical status/labs   ESRD -  Following episode of AKI/CKD stage IV and has been dialysis dependent since 08/06/22.  Plan for HD on MWF schedule.  Tolerated HD 8/28 @ [facility] with 446m UF. No absolute indication for RRT with no dyspnea; plan for next treatment tomorrow.   Hypertension/volume  -  stable but Bp's on low side.  Anemia  - Continue with ESA and transfuse for Hgb <7 and IV iron.  Metabolic bone disease -  meds on hold as pt is npo  Nutrition - npo for now  Subjective:   Main complain is mid abdominal pain;  denies nausea/vomiting today. Also denies sob.   Objective:   BP (!) 128/44 (BP Location: Left Arm)   Pulse 62   Temp 99.9 F (37.7 C) (Oral)   Resp 18   Ht 6' 2"  (1.88 m)   Wt 110 kg   SpO2 94%   BMI 31.14 kg/m   Intake/Output Summary (Last 24 hours) at 08/20/2022 0910 Last data filed at 08/20/2022 0320 Gross per 24 hour  Intake 826.26 ml  Output 497 ml  Net 329.26 ml   Weight change: 0 kg  Physical Exam: General appearance: fatigued, mild distress from abd pain and ill-appearing Head: NCAT Resp: clear to auscultation bilaterally Cardio: regular rate and rhythm, S1, S2 normal, no murmur, click, rub or gallop GI: +BS, soft, + tenderness and guarding in mid abd, no rebound Extremities: edema 1+ bilateral ankle edema Access: RIJ TC  Imaging: UKoreaAbdomen Limited RUQ (LIVER/GB)  Result Date: 08/20/2022 CLINICAL DATA:  Choledocholithiasis EXAM: ULTRASOUND ABDOMEN LIMITED RIGHT UPPER QUADRANT COMPARISON:  CT abdomen/pelvis dated 08/19/2022 FINDINGS: Gallbladder: Cholelithiasis with distension, gallbladder wall thickening, pericholecystic fluid, and positive sonographic Murphy's sign. Common bile duct: Diameter: 4 mm Liver: The upper limits of normal for parenchymal echogenicity. 9 mm hyperechoic lesion in the anterior right hepatic lobe, possibly reflecting a small hemangioma portal vein is patent on color Doppler imaging with normal direction of blood flow towards the liver. Other: None. IMPRESSION: Cholelithiasis with suspected acute cholecystitis. No  intrahepatic or extrahepatic ductal dilatation. Common duct measures 4 mm. Electronically Signed   By: Julian Hy M.D.   On: 08/20/2022 01:33   CT ABDOMEN PELVIS WO CONTRAST  Result Date: 08/19/2022 CLINICAL DATA:  emesis and abdominal pain since yesterday. EXAM: CT ABDOMEN AND PELVIS WITHOUT CONTRAST TECHNIQUE: Multidetector CT imaging of the abdomen and pelvis was performed following the standard protocol without IV contrast.  RADIATION DOSE REDUCTION: This exam was performed according to the departmental dose-optimization program which includes automated exposure control, adjustment of the mA and/or kV according to patient size and/or use of iterative reconstruction technique. COMPARISON:  CT May 22, 2021. FINDINGS: Lower chest: Scarring/atelectasis in the bilateral lung bases. Hepatobiliary: Scattered bilobar hypodense hepatic lesions are technically too small to accurately characterize an incompletely evaluated on this noncontrast examination but appears similar prior and statistically likely to reflect benign cysts or hemangiomas. Gallbladder is distended with pericholecystic fluid and gallbladder wall thickening consistent with cholecystitis. No biliary ductal dilation. Pancreas: No pancreatic ductal dilation or evidence of acute inflammation. Spleen: Normal in size without focal abnormality. Adrenals/Urinary Tract: Bilateral adrenal glands appear normal. No hydronephrosis. Bilateral hypodense renal lesions measure fluid density consistent with cysts and considered benign requiring no independent imaging follow-up. Additional bilateral hypodense renal lesions are technically too small to accurately characterize but statistically likely to reflect cysts and considered benign requiring no independent imaging follow-up. Urinary bladder is unremarkable for degree of distension. Stomach/Bowel: No radiopaque enteric contrast material was administered. Stomach is minimally distended limiting evaluation. No pathologic dilation of large or small bowel. Appendix is not confidently identified however there is no pericecal inflammation. Vascular/Lymphatic: Aortic atherosclerosis. No pathologically enlarged abdominal or pelvic lymph nodes. Reproductive: Prostate is mildly enlarged, unchanged. Other: No significant abdominopelvic free fluid. Musculoskeletal: Spinal stimulating generator in the posterior paramedian right lumbar spine soft tissues.  Lumbosacral fixation hardware. Multilevel degenerative changes spine. IMPRESSION: 1. Acute cholecystitis, without evidence of perforation. 2. No evidence of bowel obstruction. 3.  Aortic Atherosclerosis (ICD10-I70.0). Electronically Signed   By: Dahlia Bailiff M.D.   On: 08/19/2022 08:17   DG Chest Port 1 View  Result Date: 08/19/2022 CLINICAL DATA:  Sepsis. EXAM: PORTABLE CHEST 1 VIEW COMPARISON:  August 06, 2022. FINDINGS: Stable cardiomegaly. Right internal jugular dialysis catheter is unchanged in position. Both lungs are clear. The visualized skeletal structures are unremarkable. IMPRESSION: No active disease. Electronically Signed   By: Marijo Conception M.D.   On: 08/19/2022 08:16    Labs: BMET Recent Labs  Lab 08/19/22 0732 08/20/22 0114  NA 135 137  K 4.3 4.1  CL 92* 95*  CO2 27 25  GLUCOSE 133* 112*  BUN 63* 31*  CREATININE 4.90* 3.35*  CALCIUM 9.2 9.0   CBC Recent Labs  Lab 08/19/22 0732 08/20/22 0114  WBC 24.9* 19.4*  NEUTROABS 20.9*  --   HGB 9.1* 9.3*  HCT 28.2* 27.6*  MCV 98.6 94.8  PLT 215 186    Medications:     allopurinol  100 mg Oral Daily   amLODipine  10 mg Oral QPM   [START ON 08/25/2022] calcitRIOL  0.25 mcg Oral Q Sun   carvedilol  6.25 mg Oral BID WC   Chlorhexidine Gluconate Cloth  6 each Topical Q0600   cloNIDine  0.1 mg Oral TID   cycloSPORINE  1 drop Both Eyes BID   hydrALAZINE  100 mg Oral TID   isosorbide mononitrate  60 mg Oral BID   sodium chloride flush  3 mL  Intravenous Q12H      Otelia Santee, MD 08/20/2022, 9:10 AM

## 2022-08-20 NOTE — Progress Notes (Addendum)
PROGRESS NOTE    [name]  XYV:[id number] DOB: 1946-03-06 DOA: 08/19/2022 PCP: Redmond School, MD   Brief Narrative:  [name] is a 76 y.o. male with medical history significant for ESRD on HD Monday Wednesday Friday -recently initiated, CAD, chronic diastolic/systolic CHF, IDDM2, hypertension, dyslipidemia, GERD, gout, and obesity who was recently discharged on 8/19 after admission for chest pain with volume overload.  He was started on hemodialysis after dialysis catheter placement during that admission and was discharged to SNF.    Presents with worsening epigastric abdominal pain noted to be right upper quadrant, imaging and exam concerning for cholecystitis, general surgery recommending percutaneous cholecystostomy tube given high risk for surgery.  Transferred to [facility] for further evaluation, interventional radiology evaluation/procedure as well as to continue hemodialysis.   Assessment & Plan:   Principal Problem:   Acute cholecystitis Active Problems:   ESRD (end stage renal disease) (HCC)   CAD S/P percutaneous coronary angioplasty   GERD (gastroesophageal reflux disease)   Diabetes mellitus type 2 in obese (HCC)   Anemia, normocytic normochromic   Essential hypertension   Mixed hyperlipidemia   Chronic diastolic heart failure (HCC)   Chronic gout, unspecified, 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ophi)   Body mass index (BMI) 33.0-33.9, adult  Acute cholecystitis, POA Concurrently elevated LFT/Bilirubin, POA Appreciate general surgery recommendations: non-operative approach, outpatient follow-up in their office September  -Interventional radiology consulted, status post percutaneous cholecystostomy tube 08/20/2022 -Continue supportive care for nausea vomiting and pain -currently well controlled -Continue broad-spectrum antibiotics with Zosyn  Type 2 diabetes, insulin-dependent Renal/carb modified diet SSI/type II ischemic protocol ongoing   Chronic systolic and diastolic  CHF -not in acute exacerbation Echocardiogram 8/23 with LVEF 35-40% with LV global hypokinesis Appears euvolemic -monitor closely in the setting of IV fluids as above   CAD with prior stent Hold aspirin and statin for now   ESRD on HD, mwf Primary dialysis catheter present since 8/15 Nephrology consulted for inpatient hemodialysis management   Hypertension, essential Well-controlled on current regimen and dialysis including amlodipine carvedilol clonidine hydralazine isosorbide   Chronic opioid dependence/chronic back pain Continue pain management oral and IV as ordered Low threshold to de-escalate IV narcotics given history   Paroxysmal atrial fibrillation Recent diagnosis prior to hospitalization - not pursuing chronic anticoagulation therapy given chronic anemia/bleeding risk CHA2DS2-VASc of 6   Chronic anemia of chronic renal disease Hemoglobin 9.1, transfuse for hemoglobin less than 8/symptomatic No overt bleeding noted   Dyslipidemia Hold statin given transaminitis due to above   History of gout Not in acute exacerbation Continue allopurinol   GERD Continue PPI   Obesity BMI 31.14    DVT prophylaxis: SCDs Code Status: Full Family Communication: None present  Consults called:General surgery, IR, Nephrology  Status is: Inpatient  Dispo: The patient is from: Home              Anticipated d/c is to: Home              Anticipated d/c date is: 67 to 72 hours              Patient currently not medically stable for discharge given need for procedure as above, IV narcotics antibiotics and fluids in the setting of acute infection  Procedures:  Percutaneous cholecystostomy 08/20/2022  Antimicrobials:  Zosyn, ongoing  Subjective: No acute issues or events overnight, complaining of generalized nausea with right upper quadrant dull aching pain.  Otherwise denies vomiting diarrhea constipation headache fevers chills or chest pain.  Objective: Vitals:   08/19/22  2200 08/19/22 2310 08/20/22 0015 08/20/22 0612  BP: (!) 146/66 (!) 124/93 (!) 139/52 (!) 128/44  Pulse: 66 91 64 62  Resp: 16 (!) 22 18 18   Temp:   98.1 F (36.7 C) 99.9 F (37.7 C)  TempSrc:   Oral Oral  SpO2: 100% 95% 96% 94%  Weight:      Height:        Intake/Output Summary (Last 24 hours) at 08/20/2022 0803 Last data filed at 08/20/2022 0320 Gross per 24 hour  Intake 826.26 ml  Output 497 ml  Net 329.26 ml   Filed Weights   08/19/22 0637 08/19/22 1218  Weight: 110 kg 110 kg    Examination:  General:  Pleasantly resting in bed, No acute distress. HEENT:  Normocephalic atraumatic.  Sclerae nonicteric, noninjected.  Extraocular movements intact bilaterally. Neck:  Without mass or deformity.  Trachea is midline. Lungs:  Clear to auscultate bilaterally without rhonchi, wheeze, or rales. Heart:  Regular rate and rhythm.  Without murmurs, rubs, or gallops. Abdomen:  Soft, nontender, nondistended.  Without guarding or rebound. Extremities: Without cyanosis, clubbing, edema, or obvious deformity. Vascular:  Dorsalis pedis and posterior tibial pulses palpable bilaterally. Skin:  Warm and dry, no erythema, no ulcerations.  Data Reviewed: I have personally reviewed following labs and imaging studies  CBC: Recent Labs  Lab 08/19/22 0732 08/20/22 0114  WBC 24.9* 19.4*  NEUTROABS 20.9*  --   HGB 9.1* 9.3*  HCT 28.2* 27.6*  MCV 98.6 94.8  PLT 215 956   Basic Metabolic Panel: Recent Labs  Lab 08/19/22 0732 08/20/22 0114  NA 135 137  K 4.3 4.1  CL 92* 95*  CO2 27 25  GLUCOSE 133* 112*  BUN 63* 31*  CREATININE 4.90* 3.35*  CALCIUM 9.2 9.0  MG  --  1.9   GFR: Estimated Creatinine Clearance: 24.4 mL/min (A) (by C-G formula based on SCr of 3.35 mg/dL (H)). Liver Function Tests: Recent Labs  Lab 08/19/22 0732 08/20/22 0114  AST 711* 402*  ALT 273* 252*  ALKPHOS 613* 673*  BILITOT 3.3* 5.1*  PROT 8.2* 7.0  ALBUMIN 3.3* 2.8*   Recent Labs  Lab 08/19/22 0928  08/20/22 0114  LIPASE 925* 136*   No results for input(s): "AMMONIA" in the last 168 hours. Coagulation Profile: Recent Labs  Lab 08/19/22 0732 08/20/22 0637  INR 1.6* 1.5*   Cardiac Enzymes: No results for input(s): "CKTOTAL", "CKMB", "CKMBINDEX", "TROPONINI" in the last 168 hours. BNP (last 3 results) No results for input(s): "PROBNP" in the last 8760 hours. HbA1C: No results for input(s): "HGBA1C" in the last 72 hours. CBG: Recent Labs  Lab 08/20/22 0017  GLUCAP 121*   Lipid Profile: No results for input(s): "CHOL", "HDL", "LDLCALC", "TRIG", "CHOLHDL", "LDLDIRECT" in the last 72 hours. Thyroid Function Tests: No results for input(s): "TSH", "T4TOTAL", "FREET4", "T3FREE", "THYROIDAB" in the last 72 hours. Anemia Panel: No results for input(s): "VITAMINB12", "FOLATE", "FERRITIN", "TIBC", "IRON", "RETICCTPCT" in the last 72 hours. Sepsis Labs: Recent Labs  Lab 08/19/22 0732 08/19/22 0928  LATICACIDVEN 1.6 1.3    Recent Results (from the past 240 hour(s))  Blood Culture (routine x 2)     Status: None (Preliminary result)   Collection Time: 08/19/22  7:32 AM   Specimen: Left Antecubital; Blood  Result Value Ref Range Status   Specimen Description   Final    LEFT ANTECUBITAL BOTTLES DRAWN AEROBIC AND ANAEROBIC   Special Requests Blood Culture adequate volume  Final   Culture   Final    NO GROWTH < 24 HOURS Performed at [facility], [address] [place], [place] [address]    Report Status PENDING  Incomplete  Blood Culture (routine x 2)     Status: None (Preliminary result)   Collection Time: 08/19/22  7:40 AM   Specimen: BLOOD RIGHT FOREARM  Result Value Ref Range Status   Specimen Description   Final    BLOOD RIGHT FOREARM BOTTLES DRAWN AEROBIC AND ANAEROBIC   Special Requests   Final    Blood Culture results may not be optimal due to an inadequate volume of blood received in culture bottles   Culture   Final    NO GROWTH < 24 HOURS Performed at [facility], [address], [place], [place] [address]    Report Status PENDING  Incomplete     Radiology Studies: US Abdomen Limited RUQ (LIVER/GB)  Result Date: 08/20/2022 CLINICAL DATA:  Choledocholithiasis EXAM: ULTRASOUND ABDOMEN LIMITED RIGHT UPPER QUADRANT COMPARISON:  CT abdomen/pelvis dated 08/19/2022 FINDINGS: Gallbladder: Cholelithiasis with distension, gallbladder wall thickening, pericholecystic fluid, and positive sonographic Murphy's sign. Common bile duct: Diameter: 4 mm Liver: The upper limits of normal for parenchymal echogenicity. 9 mm hyperechoic lesion in the anterior right hepatic lobe, possibly reflecting a small hemangioma portal vein is patent on color Doppler imaging with normal direction of blood flow towards the liver. Other: None. IMPRESSION: Cholelithiasis with suspected acute cholecystitis. No intrahepatic or extrahepatic ductal dilatation. Common duct measures 4 mm. Electronically Signed   By: Julian Hy M.D.   On: 08/20/2022 01:33   CT ABDOMEN PELVIS WO CONTRAST  Result Date: 08/19/2022 CLINICAL DATA:  emesis and abdominal pain since yesterday. EXAM: CT ABDOMEN AND PELVIS WITHOUT CONTRAST TECHNIQUE: Multidetector CT imaging of the abdomen and pelvis was performed following the standard protocol without IV contrast. RADIATION DOSE REDUCTION: This exam was performed according to the departmental dose-optimization program which includes automated exposure control, adjustment of the mA and/or kV according to patient size and/or use of iterative reconstruction technique. COMPARISON:  CT May 22, 2021. FINDINGS: Lower chest: Scarring/atelectasis in the bilateral lung bases. Hepatobiliary: Scattered bilobar hypodense hepatic lesions are technically too small to accurately characterize an incompletely evaluated on this noncontrast examination but appears similar prior and statistically likely to reflect benign cysts or hemangiomas. Gallbladder is distended with pericholecystic  fluid and gallbladder wall thickening consistent with cholecystitis. No biliary ductal dilation. Pancreas: No pancreatic ductal dilation or evidence of acute inflammation. Spleen: Normal in size without focal abnormality. Adrenals/Urinary Tract: Bilateral adrenal glands appear normal. No hydronephrosis. Bilateral hypodense renal lesions measure fluid density consistent with cysts and considered benign requiring no independent imaging follow-up. Additional bilateral hypodense renal lesions are technically too small to accurately characterize but statistically likely to reflect cysts and considered benign requiring no independent imaging follow-up. Urinary bladder is unremarkable for degree of distension. Stomach/Bowel: No radiopaque enteric contrast material was administered. Stomach is minimally distended limiting evaluation. No pathologic dilation of large or small bowel. Appendix is not confidently identified however there is no pericecal inflammation. Vascular/Lymphatic: Aortic atherosclerosis. No pathologically enlarged abdominal or pelvic lymph nodes. Reproductive: Prostate is mildly enlarged, unchanged. Other: No significant abdominopelvic free fluid. Musculoskeletal: Spinal stimulating generator in the posterior paramedian right lumbar spine soft tissues. Lumbosacral fixation hardware. Multilevel degenerative changes spine. IMPRESSION: 1. Acute cholecystitis, without evidence of perforation. 2. No evidence of bowel obstruction. 3.  Aortic Atherosclerosis (ICD10-I70.0). Electronically Signed   By: Dellis Filbert  Nance Pew M.D.   On: 08/19/2022 08:17   DG Chest Port 1 View  Result Date: 08/19/2022 CLINICAL DATA:  Sepsis. EXAM: PORTABLE CHEST 1 VIEW COMPARISON:  August 06, 2022. FINDINGS: Stable cardiomegaly. Right internal jugular dialysis catheter is unchanged in position. Both lungs are clear. The visualized skeletal structures are unremarkable. IMPRESSION: No active disease. Electronically Signed   By: Marijo Conception M.D.   On: 08/19/2022 08:16    Scheduled Meds:  allopurinol  100 mg Oral Daily   amLODipine  10 mg Oral QPM   [START ON 08/25/2022] calcitRIOL  0.25 mcg Oral Q Sun   carvedilol  6.25 mg Oral BID WC   Chlorhexidine Gluconate Cloth  6 each Topical Q0600   cloNIDine  0.1 mg Oral TID   cycloSPORINE  1 drop Both Eyes BID   hydrALAZINE  100 mg Oral TID   isosorbide mononitrate  60 mg Oral BID   sodium chloride flush  3 mL Intravenous Q12H   Continuous Infusions:  sodium chloride 10 mL/hr at 08/20/22 0003   albumin human 60 mL/hr at 08/20/22 0003   piperacillin-tazobactam (ZOSYN)  IV Stopped (08/20/22 0239)     LOS: 1 day   Time spent: 53mn  Sherriann Szuch C Dary Dilauro, DO [facility]  If 7PM-7AM, please contact night-coverage [email or web address]  08/20/2022, 8:03 AM

## 2022-08-20 NOTE — Procedures (Signed)
Interventional Radiology Procedure Note  Procedure: Image guided percutaneous cholecystostomy tube placement  Findings: Please refer to procedural dictation for full description. Subhepatic/transperitoneal approach for 10 Fr cholecystostomy tube placement.  Aspiration of approximately 100 mL dark bilious fluid, sent for culture.  Completion limited cholecystogram compatible with cholelithiasis, cystic duct not visualized.  Complications: None immediate  Estimated Blood Loss: < 5 mL  Recommendations: Keep to bag drainage. Follow up culture. IR will arrange for routine check and exchange in 8 weeks.  Will follow up with Dr. Constance Haw regarding long term plan/surgical candidacy.  If not a surgical candidate, he would likely be a an excellent candidate for choledochoscopic guided percutaneous gallstone retrieval (Spyglass) and eventual tube removal.   [name] Cancer, MD Pager: [phone]

## 2022-08-20 NOTE — Progress Notes (Signed)
Pt receives out-pt HD at [facility] on MWF. Pt has an 11:50 chair time. Will assist as needed.   Melven Sartorius Renal Navigator [phone]

## 2022-08-21 DIAGNOSIS — K810 Acute cholecystitis: Secondary | ICD-10-CM | POA: Diagnosis not present

## 2022-08-21 LAB — COMPREHENSIVE METABOLIC PANEL
ALT: 195 U/L — ABNORMAL HIGH (ref 0–44)
AST: 257 U/L — ABNORMAL HIGH (ref 15–41)
Albumin: 2.3 g/dL — ABNORMAL LOW (ref 3.5–5.0)
Alkaline Phosphatase: 599 U/L — ABNORMAL HIGH (ref 38–126)
Anion gap: 17 — ABNORMAL HIGH (ref 5–15)
BUN: 50 mg/dL — ABNORMAL HIGH (ref 8–23)
CO2: 26 mmol/L (ref 22–32)
Calcium: 9.2 mg/dL (ref 8.9–10.3)
Chloride: 97 mmol/L — ABNORMAL LOW (ref 98–111)
Creatinine, Ser: 5.49 mg/dL — ABNORMAL HIGH (ref 0.61–1.24)
GFR, Estimated: 10 mL/min — ABNORMAL LOW (ref >60)
Glucose, Bld: 85 mg/dL (ref 70–99)
Potassium: 3.8 mmol/L (ref 3.5–5.1)
Sodium: 140 mmol/L (ref 135–145)
Total Bilirubin: 7.3 mg/dL — ABNORMAL HIGH (ref 0.3–1.2)
Total Protein: 6.8 g/dL (ref 6.5–8.1)

## 2022-08-21 LAB — RENAL FUNCTION PANEL
Albumin: 2.3 g/dL — ABNORMAL LOW (ref 3.5–5.0)
Anion gap: 19 — ABNORMAL HIGH (ref 5–15)
BUN: 58 mg/dL — ABNORMAL HIGH (ref 8–23)
CO2: 26 mmol/L (ref 22–32)
Calcium: 9.6 mg/dL (ref 8.9–10.3)
Chloride: 95 mmol/L — ABNORMAL LOW (ref 98–111)
Creatinine, Ser: 6.5 mg/dL — ABNORMAL HIGH (ref 0.61–1.24)
GFR, Estimated: 8 mL/min — ABNORMAL LOW (ref >60)
Glucose, Bld: 98 mg/dL (ref 70–99)
Phosphorus: 4.6 mg/dL (ref 2.5–4.6)
Potassium: 4 mmol/L (ref 3.5–5.1)
Sodium: 140 mmol/L (ref 135–145)

## 2022-08-21 LAB — CBC
HCT: 25.3 % — ABNORMAL LOW (ref 39.0–52.0)
Hemoglobin: 8.2 g/dL — ABNORMAL LOW (ref 13.0–17.0)
MCH: 31.1 pg (ref 26.0–34.0)
MCHC: 32.4 g/dL (ref 30.0–36.0)
MCV: 95.8 fL (ref 80.0–100.0)
Platelets: 191 10*3/uL (ref 150–400)
RBC: 2.64 MIL/uL — ABNORMAL LOW (ref 4.22–5.81)
RDW: 16.3 % — ABNORMAL HIGH (ref 11.5–15.5)
WBC: 12.3 10*3/uL — ABNORMAL HIGH (ref 4.0–10.5)
nRBC: 0 % (ref 0.0–0.2)

## 2022-08-21 MED ORDER — HYDROMORPHONE HCL 1 MG/ML IJ SOLN
0.5000 mg | INTRAMUSCULAR | Status: DC | PRN
  Administered 2022-08-21 – 2022-08-22 (×4): 0.5 mg via INTRAVENOUS
  Filled 2022-08-21 (×4): qty 0.5

## 2022-08-21 NOTE — Progress Notes (Signed)
Referring Physician(s): [name]  Supervising Physician: Sandi Mariscal  Patient Status:  [name] - In-pt  Chief Complaint:  Cholecystitis = s/p perc chole  Brief History:  [name] is a 76 y.o. male with medical issues including ESRD, CAD, CHF, DM, HTN, HLD, and GERD  Was recently hospitalized with chest pain and volume overload then discharged to SNF.  He developed abd pain and nausea/vomiting about 4 days ago.  Labs showed leukocytosis, elevated LFTs, and elevated lipase.   CT showed= Acute cholecystitis, without evidence of perforation.   US showed = Cholelithiasis with suspected acute cholecystitis. No intrahepatic or extrahepatic ductal dilatation. Common duct measures 4 mm.  He was deemed a poor surgical candidate by Dr. Constance Haw and he underwent a percutaneous cholecystostomy by Dr. Serafina Royals yesterday.  Subjective:  Doing ok. Up walking with PT.  Allergies: Metformin, Niacin, and Niacin and related  Medications: Prior to Admission medications   Medication Sig Start Date End Date Taking? Authorizing Provider  acarbose (PRECOSE) 50 MG tablet Take 50 mg by mouth in the morning, at noon, and at bedtime.  10/20/17  Yes [provider]  acetaminophen (TYLENOL) 500 MG tablet Take 2 tablets (1,000 mg total) by mouth every 6 (six) hours. 08/10/22  Yes Shahmehdi, Seyed A, MD  allopurinol (ZYLOPRIM) 100 MG tablet Take 100 mg by mouth daily. 08/17/20  Yes [provider]  amLODipine (NORVASC) 10 MG tablet Take 10 mg by mouth every evening.    Yes [provider]  aspirin EC 81 MG tablet Take 1 tablet (81 mg total) by mouth daily. 09/14/19  Yes Herminio Commons, MD  calcitRIOL (ROCALTROL) 0.25 MCG capsule Take 0.25 mcg by mouth every Sunday.  12/13/16  Yes [provider]  carboxymethylcellulose (REFRESH PLUS) 0.5 % SOLN Place 1 drop into both eyes daily as needed (dry eyes).   Yes [provider]  carvedilol (COREG)  6.25 MG tablet Take 6.25 mg by mouth 2 (two) times daily with a meal. 04/18/22 04/18/23 Yes [provider]  cholecalciferol (VITAMIN D) 1000 units tablet Take 1,000 Units daily by mouth.   Yes [provider]  cloNIDine (CATAPRES) 0.1 MG tablet Take 0.1 mg by mouth 3 (three) times daily. 02/15/20  Yes [provider]  insulin NPH Human (NOVOLIN N) 100 UNIT/ML injection Inject 0.18 mLs (18 Units total) into the skin every morning. Ands syringes 1/day Patient taking differently: Inject 18 Units into the skin daily. 06/27/21  Yes Renato Shin, MD  isosorbide mononitrate (IMDUR) 60 MG 24 hr tablet Take 1 tablet (60 mg total) by mouth 2 (two) times daily. 06/26/22 06/21/23 Yes Branch, Alphonse Guild, MD  polyethylene glycol (MIRALAX / GLYCOLAX) 17 g packet Take 17 g by mouth daily. 08/11/22  Yes Shahmehdi, Seyed A, MD  pravastatin (PRAVACHOL) 40 MG tablet Take 1 tablet (40 mg total) by mouth every evening. 07/31/21 02/09/28 Yes Imogene Burn, PA-C  RESTASIS 0.05 % ophthalmic emulsion Place 1 drop into both eyes 2 (two) times daily. 06/19/22  Yes [provider]  senna (SENOKOT) 8.6 MG TABS tablet Take 2 tablets (17.2 mg total) by mouth 2 (two) times daily. 08/10/22  Yes Shahmehdi, Seyed A, MD  sitaGLIPtin (JANUVIA) 25 MG tablet Take 25 mg by mouth daily.   Yes [provider]  traMADol (ULTRAM) 50 MG tablet Take 50 mg by mouth 4 (four) times daily as needed for pain. 08/14/22  Yes [provider]  epoetin alfa (EPOGEN) 3000 UNIT/ML  injection Inject into the skin. 07/03/22   [provider]  nitroGLYCERIN (NITROSTAT) 0.4 MG SL tablet Place 1 tablet (0.4 mg total) under the tongue every 5 (five) minutes as needed for chest pain. PLACE ONE TABLET UNDER THE TONGUE EVERY 5 MINUTES FOR 3 DOSES AS NEEDED Strength: 0.4 mg 02/07/22   Arnoldo Lenis, MD  TRUE METRIX BLOOD GLUCOSE TEST test strip USE TO TEST BLOOD SUGAR UP TO TWICE DAILY OR AS DIRECTED 08/31/21    Renato Shin, MD     Vital Signs: BP (!) 114/52 (BP Location: Left Arm)   Pulse (!) 41   Temp 98.5 F (36.9 C) (Oral)   Resp 16   Ht 6' 2"  (1.88 m)   Wt 242 lb 8.1 oz (110 kg)   SpO2 94%   BMI 31.14 kg/m   Physical Exam Constitutional:      Appearance: Normal appearance.  HENT:     Head: Normocephalic and atraumatic.  Cardiovascular:     Rate and Rhythm: Normal rate.  Pulmonary:     Effort: Pulmonary effort is normal. No respiratory distress.  Skin:    General: Skin is warm and dry.  Neurological:     General: No focal deficit present.     Mental Status: He is alert and oriented to person, place, and time.  Psychiatric:        Mood and Affect: Mood normal.        Behavior: Behavior normal.        Thought Content: Thought content normal.        Judgment: Judgment normal.   Drain Location: RUQ Size: Fr size: 10 Fr Date of placement: 08/20/22 Currently to: Drain collection device: gravity 24 hour output:  Output by Drain (mL) 08/19/22 0701 - 08/19/22 1900 08/19/22 1901 - 08/20/22 0700 08/20/22 0701 - 08/20/22 1900 08/20/22 1901 - 08/21/22 0700 08/21/22 0701 - 08/21/22 1139  Closed System Drain 1 Lateral;Right Abdomen 10 Fr.   15 130     Current examination: Flushes/aspirates easily per RN (not flushed by me as patient was ambulating in hall with PT. Bilious output in bag.  Imaging: IR Perc Cholecystostomy  Result Date: 08/20/2022 INDICATION: 76 year old male with acute calculus cholecystitis, poor surgical candidate. EXAM: Ultrasound and fluoroscopic guided percutaneous cholecystostomy placement MEDICATIONS: 2.25 g Zosyn, intravenous; The antibiotic was administered within an appropriate time frame prior to the initiation of the procedure. ANESTHESIA/SEDATION: Moderate (conscious) sedation was employed during this procedure. A total of Versed 1 mg and Fentanyl 50 mcg was administered intravenously. Moderate Sedation Time: 11 minutes. The patient's level of  consciousness and vital signs were monitored continuously by radiology nursing throughout the procedure under my direct supervision. FLUOROSCOPY TIME:  Two mGy COMPLICATIONS: None immediate. PROCEDURE: Informed written consent was obtained from the patient after a thorough discussion of the procedural risks, benefits and alternatives. All questions were addressed. Maximal Sterile Barrier Technique was utilized including caps, mask, sterile gowns, sterile gloves, sterile drape, hand hygiene and skin antiseptic. A timeout was performed prior to the initiation of the procedure. The patient was placed supine on the angiographic table. The patient's right upper quadrant was then prepped and draped in normal sterile fashion with maximum sterile barrier. Ultrasound demonstrates a distended gallbladder with multiple internal echoes compatible with cholelithiasis. Subdermal Local anesthesia was provided at the planned skin entry site. Under ultrasound guidance, deeper local anesthetic was provided through intercostal muscles and along the liver capsule. Ultrasound was used to puncture the gallbladder using  a 51 gauge trocar needle via a subhepatic/trans peritoneal approach with visualization of the lung treated to the gallbladder. An Amplatz wire was advanced into the lumen and a transition dilator placed. A gentle hand injection of contrast was performed. Cholecystogram demonstrates irregular filling defects consistent with gallstones. The cystic duct is obstructed. A 0.035 inch exchange wire was placed in the tract was dilated. A 10 French multipurpose drainage catheter was advanced into the gallbladder lumen. The drain was then secured in place using a 0-silk suture and a Stayfix device. A sterile dressing was applied. The tube was placed to bag drainage. A culture was sent to the lab for analysis. The patient tolerated procedure well without evidence of immediate complication was transferred back to the floor in stable  condition. IMPRESSION: Successful placement of percutaneous, subhepatic/transperitoneal cholecystostomy tube. PLAN: Pending candidacy for cholecystectomy, plan for return in 8 weeks for initial routine cholecystostomy tube check and exchange. If deemed not a surgical candidate, the patient will be evaluated for possible candidacy for percutaneous choledochoscopic-assisted (Spyglass) gallstone retrieval and hopeful of eventual tube removal. Ruthann Cancer, MD [facility] Radiology Electronically Signed   By: Ruthann Cancer M.D.   On: 08/20/2022 14:02   US Abdomen Limited RUQ (LIVER/GB)  Result Date: 08/20/2022 CLINICAL DATA:  Choledocholithiasis EXAM: ULTRASOUND ABDOMEN LIMITED RIGHT UPPER QUADRANT COMPARISON:  CT abdomen/pelvis dated 08/19/2022 FINDINGS: Gallbladder: Cholelithiasis with distension, gallbladder wall thickening, pericholecystic fluid, and positive sonographic Murphy's sign. Common bile duct: Diameter: 4 mm Liver: The upper limits of normal for parenchymal echogenicity. 9 mm hyperechoic lesion in the anterior right hepatic lobe, possibly reflecting a small hemangioma portal vein is patent on color Doppler imaging with normal direction of blood flow towards the liver. Other: None. IMPRESSION: Cholelithiasis with suspected acute cholecystitis. No intrahepatic or extrahepatic ductal dilatation. Common duct measures 4 mm. Electronically Signed   By: Julian Hy M.D.   On: 08/20/2022 01:33   CT ABDOMEN PELVIS WO CONTRAST  Result Date: 08/19/2022 CLINICAL DATA:  emesis and abdominal pain since yesterday. EXAM: CT ABDOMEN AND PELVIS WITHOUT CONTRAST TECHNIQUE: Multidetector CT imaging of the abdomen and pelvis was performed following the standard protocol without IV contrast. RADIATION DOSE REDUCTION: This exam was performed according to the departmental dose-optimization program which includes automated exposure control, adjustment of the mA and/or  kV according to patient size and/or use of iterative reconstruction technique. COMPARISON:  CT May 22, 2021. FINDINGS: Lower chest: Scarring/atelectasis in the bilateral lung bases. Hepatobiliary: Scattered bilobar hypodense hepatic lesions are technically too small to accurately characterize an incompletely evaluated on this noncontrast examination but appears similar prior and statistically likely to reflect benign cysts or hemangiomas. Gallbladder is distended with pericholecystic fluid and gallbladder wall thickening consistent with cholecystitis. No biliary ductal dilation. Pancreas: No pancreatic ductal dilation or evidence of acute inflammation. Spleen: Normal in size without focal abnormality. Adrenals/Urinary Tract: Bilateral adrenal glands appear normal. No hydronephrosis. Bilateral hypodense renal lesions measure fluid density consistent with cysts and considered benign requiring no independent imaging follow-up. Additional bilateral hypodense renal lesions are technically too small to accurately characterize but statistically likely to reflect cysts and considered benign requiring no independent imaging follow-up. Urinary bladder is unremarkable for degree of distension. Stomach/Bowel: No radiopaque enteric contrast material was administered. Stomach is minimally distended limiting evaluation. No pathologic dilation of large or small bowel. Appendix is not confidently identified however there is no pericecal inflammation. Vascular/Lymphatic: Aortic atherosclerosis. No pathologically enlarged abdominal or pelvic lymph nodes.  Reproductive: Prostate is mildly enlarged, unchanged. Other: No significant abdominopelvic free fluid. Musculoskeletal: Spinal stimulating generator in the posterior paramedian right lumbar spine soft tissues. Lumbosacral fixation hardware. Multilevel degenerative changes spine. IMPRESSION: 1. Acute cholecystitis, without evidence of perforation. 2. No evidence of bowel obstruction.  3.  Aortic Atherosclerosis (ICD10-I70.0). Electronically Signed   By: Dahlia Bailiff M.D.   On: 08/19/2022 08:17   DG Chest Port 1 View  Result Date: 08/19/2022 CLINICAL DATA:  Sepsis. EXAM: PORTABLE CHEST 1 VIEW COMPARISON:  August 06, 2022. FINDINGS: Stable cardiomegaly. Right internal jugular dialysis catheter is unchanged in position. Both lungs are clear. The visualized skeletal structures are unremarkable. IMPRESSION: No active disease. Electronically Signed   By: Marijo Conception M.D.   On: 08/19/2022 08:16    Labs:  CBC: Recent Labs    08/10/22 0602 08/19/22 0732 08/20/22 0114 08/21/22 0303  WBC 9.4 24.9* 19.4* 12.3*  HGB 8.2* 9.1* 9.3* 8.2*  HCT 25.6* 28.2* 27.6* 25.3*  PLT 320 215 186 191    COAGS: Recent Labs    02/06/22 1723 05/10/22 0340 08/19/22 0732 08/20/22 0637  INR 1.0  --  1.6* 1.5*  APTT  --  30 38*  --     BMP: Recent Labs    08/09/22 0511 08/19/22 0732 08/20/22 0114 08/21/22 0303  NA 141 135 137 140  K 4.0 4.3 4.1 3.8  CL 103 92* 95* 97*  CO2 30 27 25 26   GLUCOSE 146* 133* 112* 85  BUN 62* 63* 31* 50*  CALCIUM 9.3 9.2 9.0 9.2  CREATININE 2.66* 4.90* 3.35* 5.49*  GFRNONAA 24* 12* 18* 10*    LIVER FUNCTION TESTS: Recent Labs    08/04/22 0528 08/05/22 0507 08/08/22 0425 08/19/22 0732 08/20/22 0114 08/21/22 0303  BILITOT 0.6  --   --  3.3* 5.1* 7.3*  AST 14*  --   --  711* 402* 257*  ALT 12  --   --  273* 252* 195*  ALKPHOS 54  --   --  613* 673* 599*  PROT 6.9  --   --  8.2* 7.0 6.8  ALBUMIN 2.7*   < > 2.9* 3.3* 2.8* 2.3*   < > = values in this interval not displayed.    Assessment:  Acute cholecystitis - s/p percutaneous cholecystostomy yesterday by Dr. Serafina Royals.  Plan: Continue TID flushes with 5 cc NS. Record output Q shift.  Dressing changes QD or PRN if soiled.   Call IR APP or on call IR MD if difficulty flushing or sudden change in drain output.  Repeat imaging/possible drain injection once output < 10 mL/QD  (excluding flush material). Consideration for drain removal if output is < 10 mL/QD (excluding flush material), pending discussion with the providing surgical service.  Discharge planning: Percutaneous cholecystostomy drain to remain in place at least 6 weeks.   Recommend fluoroscopy with injection of the drain in IR to evaluate for patency of the cystic duct.  If the duct is patent and general surgery feels patient is stable for cholecystectomy, the drain would be removed at time of surgery.  If the duct is patent and general surgery feels patient is NEVER a candidate for cholecystectomy, drain can be capped for a trial.  If symptoms recur, then place to gravity bag again.  If trial is successful, discuss possible removal of the drain.  Please call the IR PA at [phone] when patient is about to be discharged and we will arrange the follow up drain  injection (ok to leave message).   IR will continue to follow - please call with questions or concerns.  Electronically Signed: Murrell Redden, PA-C 08/21/2022, 11:38 AM    I spent a total of 15 Minutes at the the patient's bedside AND on the patient's hospital floor or unit, greater than 50% of which was counseling/coordinating care for f/u Perc Chole.

## 2022-08-21 NOTE — Progress Notes (Signed)
Called to give 6N bedside nurse report, nurse not available, 6N to inform nurse pt in transport back to room, floor nurse to call this rn for end of tx dialysis report

## 2022-08-21 NOTE — Progress Notes (Signed)
[facility] Progress Note   76 y.o. 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  In the ED, VSS, temp 100.3 and was ill-appearing.  Labs notable for AST 711, ALT 273, Alk phos 613, T bili 3.3, WBC 24.9, Hgb 9.1, INR 1.6.  CT scan of abdomen and pelvis consistent with acute cholecystitis.  He was admitted for IV antibiotics with plans to transfer to [facility] for IR to place cholecystostomy tube.    Dialysis Orders: Center: [facility]  on MWF . EDW 103.5kg HD Bath 2K/2.5Ca  Time 4 hours Heparin none. Access RIJ TDC BFR 400 DFR 800     Venofer  100 mg IVP every HD through 09/09/22   Assessment/ Plan:    Acute cholecystitis - too high risk for surgery and plan for cholecystostomy tube and possible MRCP to r/o stones.  U/S neg for choledocholithiasis. Currently started on IV antibiotics.  Avoid nephrotoxic medications including NSAIDs and iodinated intravenous contrast exposure unless the latter is absolutely indicated.   Preferred narcotic agents for pain control are hydromorphone, fentanyl, and methadone. Morphine should not be used.  Avoid Baclofen and avoid oral sodium phosphate and magnesium citrate based laxatives / bowel preps.  Continue strict Input and Output monitoring. Will monitor the patient closely with you and intervene or adjust therapy as indicated by changes in clinical status/labs   ESRD -  Following episode of AKI/CKD stage IV and has been dialysis dependent since 08/06/22.  Plan for HD on MWF schedule.  Tolerated HD 8/28 @ [facility] with 440m UF and due for HD today. Unit aware.   Hypertension/volume  -  stable but Bp's on low side.  Anemia  - Continue with ESA and transfuse for Hgb <7 and IV iron.  Metabolic bone disease -  meds on hold as pt is npo  Nutrition - npo for now  Subjective:   Main complain is mid abdominal pain but a little improved after the cholecystostomy tube.  Denies nausea/vomiting today. Also denies sob.   Objective:   BP (!) 114/52 (BP Location: Left Arm)   Pulse (!) 41   Temp 98.5 F (36.9 C) (Oral)   Resp 16   Ht _0  (1.88 m)   Wt 110 kg   SpO2 94%   BMI 31.14 kg/m   Intake/Output Summary (Last 24 hours) at 08/21/2022 0947 Last data filed at 08/21/2022 0548 Gross per 24 hour  Intake 160 ml  Output 345 ml  Net -185 ml   Weight change:   Physical Exam: General appearance: fatigued, mild distress from abd pain and ill-appearing Head: NCAT Resp: clear to auscultation bilaterally Cardio: regular rate and rhythm, S1, S2 normal, no murmur, click, rub or gallop GI: +BS, soft, + tenderness and guarding in mid abd, no rebound, cholecystostomy tube draining.  Extremities: edema 1+ bilateral ankle edema Access: RIJ TC  Imaging: IR Perc Cholecystostomy  Result Date: 08/20/2022 INDICATION: 76year old male with acute calculus cholecystitis, poor surgical candidate. EXAM: Ultrasound and fluoroscopic guided percutaneous cholecystostomy placement MEDICATIONS: 2.25 g Zosyn, intravenous; The antibiotic was administered within an appropriate time frame prior to the initiation of the procedure. ANESTHESIA/SEDATION: Moderate (conscious) sedation was employed during this procedure. A total of Versed 1 mg and Fentanyl 50 mcg was administered intravenously. Moderate Sedation Time: 11 minutes. The patient's level of consciousness and vital signs were monitored continuously by radiology nursing throughout the procedure under  my direct supervision. FLUOROSCOPY TIME:  Two mGy COMPLICATIONS: None immediate. PROCEDURE: Informed written consent was obtained from the patient after a thorough discussion of the procedural risks, benefits and alternatives. All questions were addressed. Maximal Sterile Barrier Technique was utilized including caps, mask, sterile gowns, sterile gloves, sterile drape, hand hygiene and skin antiseptic. A timeout was performed prior to the  initiation of the procedure. The patient was placed supine on the angiographic table. The patient's right upper quadrant was then prepped and draped in normal sterile fashion with maximum sterile barrier. Ultrasound demonstrates a distended gallbladder with multiple internal echoes compatible with cholelithiasis. Subdermal Local anesthesia was provided at the planned skin entry site. Under ultrasound guidance, deeper local anesthetic was provided through intercostal muscles and along the liver capsule. Ultrasound was used to puncture the gallbladder using a 18 gauge trocar needle via a subhepatic/trans peritoneal approach with visualization of the lung treated to the gallbladder. An Amplatz wire was advanced into the lumen and a transition dilator placed. A gentle hand injection of contrast was performed. Cholecystogram demonstrates irregular filling defects consistent with gallstones. The cystic duct is obstructed. A 0.035 inch exchange wire was placed in the tract was dilated. A 10 French multipurpose drainage catheter was advanced into the gallbladder lumen. The drain was then secured in place using a 0-silk suture and a Stayfix device. A sterile dressing was applied. The tube was placed to bag drainage. A culture was sent to the lab for analysis. The patient tolerated procedure well without evidence of immediate complication was transferred back to the floor in stable condition. IMPRESSION: Successful placement of percutaneous, subhepatic/transperitoneal cholecystostomy tube. PLAN: Pending candidacy for cholecystectomy, plan for return in 8 weeks for initial routine cholecystostomy tube check and exchange. If deemed not a surgical candidate, the patient will be evaluated for possible candidacy for percutaneous choledochoscopic-assisted (Spyglass) gallstone retrieval and hopeful of eventual tube removal. Ruthann Cancer, MD [facility] Electronically Signed    By: Ruthann Cancer M.D.   On: 08/20/2022 14:02   US Abdomen Limited RUQ (LIVER/GB)  Result Date: 08/20/2022 CLINICAL DATA:  Choledocholithiasis EXAM: ULTRASOUND ABDOMEN LIMITED RIGHT UPPER QUADRANT COMPARISON:  CT abdomen/pelvis dated 08/19/2022 FINDINGS: Gallbladder: Cholelithiasis with distension, gallbladder wall thickening, pericholecystic fluid, and positive sonographic Murphy's sign. Common bile duct: Diameter: 4 mm Liver: The upper limits of normal for parenchymal echogenicity. 9 mm hyperechoic lesion in the anterior right hepatic lobe, possibly reflecting a small hemangioma portal vein is patent on color Doppler imaging with normal direction of blood flow towards the liver. Other: None. IMPRESSION: Cholelithiasis with suspected acute cholecystitis. No intrahepatic or extrahepatic ductal dilatation. Common duct measures 4 mm. Electronically Signed   By: Julian Hy M.D.   On: 08/20/2022 01:33    Labs: BMET Recent Labs  Lab 08/19/22 0732 08/20/22 0114 08/21/22 0303  NA 135 137 140  K 4.3 4.1 3.8  CL 92* 95* 97*  CO2 _0 GLUCOSE 133* 112* 85  BUN 63* 31* 50*  CREATININE 4.90* 3.35* 5.49*  CALCIUM 9.2 9.0 9.2   CBC Recent Labs  Lab 08/19/22 0732 08/20/22 0114 08/21/22 0303  WBC 24.9* 19.4* 12.3*  NEUTROABS 20.9*  --   --   HGB 9.1* 9.3* 8.2*  HCT 28.2* 27.6* 25.3*  MCV 98.6 94.8 95.8  PLT 215 186 191    Medications:     allopurinol  100 mg Oral Daily   amLODipine  10 mg Oral QPM   [  START ON 08/25/2022] calcitRIOL  0.25 mcg Oral Q Sun   carvedilol  6.25 mg Oral BID WC   Chlorhexidine Gluconate Cloth  6 each Topical Q0600   cloNIDine  0.1 mg Oral TID   cycloSPORINE  1 drop Both Eyes BID   hydrALAZINE  100 mg Oral TID   isosorbide mononitrate  60 mg Oral BID   sodium chloride flush  3 mL Intravenous Q12H      Otelia Santee, MD 08/21/2022, 9:47 AM

## 2022-08-21 NOTE — Progress Notes (Signed)
Received patient in bed to unit.  Alert and oriented.  Informed consent signed and in chart.   Treatment initiated: 1426 Treatment completed: [place]  Patient tolerated well.  Transported back to the room  Alert, without acute distress.  Hand-off given to patient's nurse.   Access used: HD catheter right chest Access issues: yes. Arterial port with slight resistance with blood withdrawal  Total UF removed: 0.8 liters Medication(s) given: dilaudid 0.5 mg, ns bolus 100 cc x 1  Post HD VS: 98/38 HR 65 RR 16 Sat 96% on room air Temp 98.5 oral  Post HD weight: 100.3 kg   [facility]

## 2022-08-21 NOTE — Progress Notes (Addendum)
PROGRESS NOTE    [name]  OVZ:[id number] DOB: 1946-11-09 DOA: 08/19/2022 PCP: Redmond School, MD   Brief Narrative:  [name] is a 76 y.o. male with medical history significant for ESRD on HD Monday Wednesday Friday -recently initiated, CAD, chronic diastolic/systolic CHF, IDDM2, hypertension, dyslipidemia, GERD, gout, and obesity who was recently discharged on 8/19 after admission for chest pain with volume overload.  He was started on hemodialysis after dialysis catheter placement during that admission and was discharged to SNF.    Presents with worsening epigastric abdominal pain noted to be right upper quadrant, imaging and exam concerning for cholecystitis, general surgery recommending percutaneous cholecystostomy tube given high risk for surgery.  Transferred to [facility] for further evaluation, interventional radiology evaluation/procedure as well as to continue hemodialysis.   Assessment & Plan:   Principal Problem:   Acute cholecystitis Active Problems:   ESRD (end stage renal disease) (HCC)   CAD S/P percutaneous coronary angioplasty   GERD (gastroesophageal reflux disease)   Diabetes mellitus type 2 in obese (HCC)   Anemia, normocytic normochromic   Essential hypertension   Mixed hyperlipidemia   Chronic diastolic heart failure (HCC)   Chronic gout, unspecified, 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ophi)   Body mass index (BMI) 33.0-33.9, adult  Acute cholecystitis, POA Sepsis POA Concurrently elevated LFT/Bilirubin, POA - Appreciate general surgery recommendations: non-operative approach, outpatient follow-up in their office September  - Interventional radiology consulted, status post percutaneous cholecystostomy tube 08/20/2022 - tolerated well -Continue supportive care for nausea vomiting and pain -currently well controlled -Continue broad-spectrum antibiotics with Zosyn -Labs downtrending appropriately  Type 2 diabetes, insulin-dependent Renal/carb modified  diet SSI/type II ischemic protocol ongoing   Chronic systolic and diastolic CHF -not in acute exacerbation Echocardiogram 8/23 with LVEF 35-40% with LV global hypokinesis Appears euvolemic -monitor closely in the setting of IV fluids as above   CAD with prior stent Hold aspirin and statin for now   ESRD on HD, mwf Primary dialysis catheter present since 8/15 Nephrology consulted for inpatient hemodialysis management   Hypertension, essential Well-controlled on current regimen and dialysis including amlodipine carvedilol clonidine hydralazine isosorbide   Chronic opioid dependence/chronic back pain Continue pain management oral and IV as ordered Low threshold to de-escalate IV narcotics given history   Paroxysmal atrial fibrillation Recent diagnosis prior to hospitalization - not pursuing chronic anticoagulation therapy given chronic anemia/bleeding risk CHA2DS2-VASc of 6   Chronic anemia of chronic renal disease Hemoglobin 9.1, transfuse for hemoglobin less than 8/symptomatic No overt bleeding noted   Dyslipidemia Hold statin given transaminitis due to above   History of gout Not in acute exacerbation Continue allopurinol   GERD Continue PPI   Obesity BMI 31.14    DVT prophylaxis: SCDs Code Status: Full Family Communication: None present  Consults called:General surgery, IR, Nephrology  Status is: Inpatient  Dispo: The patient is from: Home              Anticipated d/c is to: Home              Anticipated d/c date is: 24-48 hours              Patient currently not medically stable for discharge given need for procedure as above, IV narcotics antibiotics and fluids in the setting of acute infection  Procedures:  Percutaneous cholecystostomy 08/20/2022  Antimicrobials:  Zosyn, ongoing  Subjective: No acute issues or events overnight, abdomen pain improving, tolerated biliary drain placement well.  Requesting to advance diet which is  certainly  reasonable.  Objective: Vitals:   08/20/22 1337 08/20/22 1651 08/20/22 2052 08/21/22 0548  BP: (!) 121/51 (!) 108/52 (!) 120/43 (!) 119/92  Pulse: 84 88 84 80  Resp: 18 19 18 17   Temp: 98.8 F (37.1 C) (!) 100.7 F (38.2 C) 99.1 F (37.3 C) 98.5 F (36.9 C)  TempSrc: Oral Oral Oral Oral  SpO2: 95% 93% 97% 97%  Weight:      Height:        Intake/Output Summary (Last 24 hours) at 08/21/2022 0747 Last data filed at 08/21/2022 0548 Gross per 24 hour  Intake 160 ml  Output 345 ml  Net -185 ml    Filed Weights   08/19/22 0637 08/19/22 1218  Weight: 110 kg 110 kg    Examination:  General:  Pleasantly resting in bed, No acute distress. HEENT:  Normocephalic atraumatic.  Sclerae nonicteric, noninjected.  Extraocular movements intact bilaterally. Neck:  Without mass or deformity.  Trachea is midline. Lungs:  Clear to auscultate bilaterally without rhonchi, wheeze, or rales. Heart:  Regular rate and rhythm.  Without murmurs, rubs, or gallops. Abdomen:  Soft, nontender, nondistended.  Right flank biliary drain clean dry intact, dark brown clear fluid without serosanguineous fluid noted in the bag Extremities: Without cyanosis, clubbing, edema, or obvious deformity. Vascular:  Dorsalis pedis and posterior tibial pulses palpable bilaterally. Skin:  Warm and dry, no erythema, no ulcerations.  Data Reviewed: I have personally reviewed following labs and imaging studies  CBC: Recent Labs  Lab 08/19/22 0732 08/20/22 0114 08/21/22 0303  WBC 24.9* 19.4* 12.3*  NEUTROABS 20.9*  --   --   HGB 9.1* 9.3* 8.2*  HCT 28.2* 27.6* 25.3*  MCV 98.6 94.8 95.8  PLT 215 186 269    Basic Metabolic Panel: Recent Labs  Lab 08/19/22 0732 08/20/22 0114 08/21/22 0303  NA 135 137 140  K 4.3 4.1 3.8  CL 92* 95* 97*  CO2 27 25 26   GLUCOSE 133* 112* 85  BUN 63* 31* 50*  CREATININE 4.90* 3.35* 5.49*  CALCIUM 9.2 9.0 9.2  MG  --  1.9  --     GFR: Estimated Creatinine Clearance: 14.9  mL/min (A) (by C-G formula based on SCr of 5.49 mg/dL (H)). Liver Function Tests: Recent Labs  Lab 08/19/22 0732 08/20/22 0114 08/21/22 0303  AST 711* 402* 257*  ALT 273* 252* 195*  ALKPHOS 613* 673* 599*  BILITOT 3.3* 5.1* 7.3*  PROT 8.2* 7.0 6.8  ALBUMIN 3.3* 2.8* 2.3*    Recent Labs  Lab 08/19/22 0928 08/20/22 0114  LIPASE 925* 136*    No results for input(s): "AMMONIA" in the last 168 hours. Coagulation Profile: Recent Labs  Lab 08/19/22 0732 08/20/22 0637  INR 1.6* 1.5*    Cardiac Enzymes: No results for input(s): "CKTOTAL", "CKMB", "CKMBINDEX", "TROPONINI" in the last 168 hours. BNP (last 3 results) No results for input(s): "PROBNP" in the last 8760 hours. HbA1C: No results for input(s): "HGBA1C" in the last 72 hours. CBG: Recent Labs  Lab 08/20/22 0017  GLUCAP 121*    Lipid Profile: No results for input(s): "CHOL", "HDL", "LDLCALC", "TRIG", "CHOLHDL", "LDLDIRECT" in the last 72 hours. Thyroid Function Tests: No results for input(s): "TSH", "T4TOTAL", "FREET4", "T3FREE", "THYROIDAB" in the last 72 hours. Anemia Panel: No results for input(s): "VITAMINB12", "FOLATE", "FERRITIN", "TIBC", "IRON", "RETICCTPCT" in the last 72 hours. Sepsis Labs: Recent Labs  Lab 08/19/22 0732 08/19/22 0928  LATICACIDVEN 1.6 1.3     Recent Results (from the  past 240 hour(s))  Blood Culture (routine x 2)     Status: None (Preliminary result)   Collection Time: 08/19/22  7:32 AM   Specimen: Left Antecubital; Blood  Result Value Ref Range Status   Specimen Description   Final    LEFT ANTECUBITAL BOTTLES DRAWN AEROBIC AND ANAEROBIC   Special Requests Blood Culture adequate volume  Final   Culture   Final    NO GROWTH 2 DAYS Performed at [facility], [address], [place], [place] [address]    Report Status PENDING  Incomplete  Blood Culture (routine x 2)     Status: None (Preliminary result)   Collection Time: 08/19/22  7:40 AM   Specimen: BLOOD RIGHT FOREARM   Result Value Ref Range Status   Specimen Description   Final    BLOOD RIGHT FOREARM BOTTLES DRAWN AEROBIC AND ANAEROBIC   Special Requests   Final    Blood Culture results may not be optimal due to an inadequate volume of blood received in culture bottles   Culture   Final    NO GROWTH 2 DAYS Performed at [facility], [address], [place], [place] [address]    Report Status PENDING  Incomplete  Aerobic/Anaerobic Culture w Gram Stain (surgical/deep wound)     Status: None (Preliminary result)   Collection Time: 08/20/22  1:13 PM   Specimen: Gallbladder; Abscess  Result Value Ref Range Status   Specimen Description GALL BLADDER  Final   Special Requests NONE  Final   Gram Stain   Final    MODERATE YEAST WITH PSEUDOHYPHAE RARE WBC PRESENT, PREDOMINANTLY PMN Performed at [facility], [address] [place], [place] [address]    Culture PENDING  Incomplete   Report Status PENDING  Incomplete     Radiology Studies: IR Perc Cholecystostomy  Result Date: 08/20/2022 INDICATION: 76 year old male with acute calculus cholecystitis, poor surgical candidate. EXAM: Ultrasound and fluoroscopic guided percutaneous cholecystostomy placement MEDICATIONS: 2.25 g Zosyn, intravenous; The antibiotic was administered within an appropriate time frame prior to the initiation of the procedure. ANESTHESIA/SEDATION: Moderate (conscious) sedation was employed during this procedure. A total of Versed 1 mg and Fentanyl 50 mcg was administered intravenously. Moderate Sedation Time: 11 minutes. The patient's level of consciousness and vital signs were monitored continuously by radiology nursing throughout the procedure under my direct supervision. FLUOROSCOPY TIME:  Two mGy COMPLICATIONS: None immediate. PROCEDURE: Informed written consent was obtained from the patient after a thorough discussion of the procedural risks, benefits and alternatives. All questions were addressed. Maximal Sterile Barrier  Technique was utilized including caps, mask, sterile gowns, sterile gloves, sterile drape, hand hygiene and skin antiseptic. A timeout was performed prior to the initiation of the procedure. The patient was placed supine on the angiographic table. The patient's right upper quadrant was then prepped and draped in normal sterile fashion with maximum sterile barrier. Ultrasound demonstrates a distended gallbladder with multiple internal echoes compatible with cholelithiasis. Subdermal Local anesthesia was provided at the planned skin entry site. Under ultrasound guidance, deeper local anesthetic was provided through intercostal muscles and along the liver capsule. Ultrasound was used to puncture the gallbladder using a 18 gauge trocar needle via a subhepatic/trans peritoneal approach with visualization of the lung treated to the gallbladder. An Amplatz wire was advanced into the lumen and a transition dilator placed. A gentle hand injection of contrast was performed. Cholecystogram demonstrates irregular filling defects consistent with gallstones. The cystic duct is obstructed. A 0.035 inch exchange  wire was placed in the tract was dilated. A 10 French multipurpose drainage catheter was advanced into the gallbladder lumen. The drain was then secured in place using a 0-silk suture and a Stayfix device. A sterile dressing was applied. The tube was placed to bag drainage. A culture was sent to the lab for analysis. The patient tolerated procedure well without evidence of immediate complication was transferred back to the floor in stable condition. IMPRESSION: Successful placement of percutaneous, subhepatic/transperitoneal cholecystostomy tube. PLAN: Pending candidacy for cholecystectomy, plan for return in 8 weeks for initial routine cholecystostomy tube check and exchange. If deemed not a surgical candidate, the patient will be evaluated for possible candidacy for percutaneous choledochoscopic-assisted (Spyglass)  gallstone retrieval and hopeful of eventual tube removal. Ruthann Cancer, MD [facility] Radiology Electronically Signed   By: Ruthann Cancer M.D.   On: 08/20/2022 14:02   US Abdomen Limited RUQ (LIVER/GB)  Result Date: 08/20/2022 CLINICAL DATA:  Choledocholithiasis EXAM: ULTRASOUND ABDOMEN LIMITED RIGHT UPPER QUADRANT COMPARISON:  CT abdomen/pelvis dated 08/19/2022 FINDINGS: Gallbladder: Cholelithiasis with distension, gallbladder wall thickening, pericholecystic fluid, and positive sonographic Murphy's sign. Common bile duct: Diameter: 4 mm Liver: The upper limits of normal for parenchymal echogenicity. 9 mm hyperechoic lesion in the anterior right hepatic lobe, possibly reflecting a small hemangioma portal vein is patent on color Doppler imaging with normal direction of blood flow towards the liver. Other: None. IMPRESSION: Cholelithiasis with suspected acute cholecystitis. No intrahepatic or extrahepatic ductal dilatation. Common duct measures 4 mm. Electronically Signed   By: Julian Hy M.D.   On: 08/20/2022 01:33   CT ABDOMEN PELVIS WO CONTRAST  Result Date: 08/19/2022 CLINICAL DATA:  emesis and abdominal pain since yesterday. EXAM: CT ABDOMEN AND PELVIS WITHOUT CONTRAST TECHNIQUE: Multidetector CT imaging of the abdomen and pelvis was performed following the standard protocol without IV contrast. RADIATION DOSE REDUCTION: This exam was performed according to the departmental dose-optimization program which includes automated exposure control, adjustment of the mA and/or kV according to patient size and/or use of iterative reconstruction technique. COMPARISON:  CT May 22, 2021. FINDINGS: Lower chest: Scarring/atelectasis in the bilateral lung bases. Hepatobiliary: Scattered bilobar hypodense hepatic lesions are technically too small to accurately characterize an incompletely evaluated on this noncontrast examination but appears similar prior and  statistically likely to reflect benign cysts or hemangiomas. Gallbladder is distended with pericholecystic fluid and gallbladder wall thickening consistent with cholecystitis. No biliary ductal dilation. Pancreas: No pancreatic ductal dilation or evidence of acute inflammation. Spleen: Normal in size without focal abnormality. Adrenals/Urinary Tract: Bilateral adrenal glands appear normal. No hydronephrosis. Bilateral hypodense renal lesions measure fluid density consistent with cysts and considered benign requiring no independent imaging follow-up. Additional bilateral hypodense renal lesions are technically too small to accurately characterize but statistically likely to reflect cysts and considered benign requiring no independent imaging follow-up. Urinary bladder is unremarkable for degree of distension. Stomach/Bowel: No radiopaque enteric contrast material was administered. Stomach is minimally distended limiting evaluation. No pathologic dilation of large or small bowel. Appendix is not confidently identified however there is no pericecal inflammation. Vascular/Lymphatic: Aortic atherosclerosis. No pathologically enlarged abdominal or pelvic lymph nodes. Reproductive: Prostate is mildly enlarged, unchanged. Other: No significant abdominopelvic free fluid. Musculoskeletal: Spinal stimulating generator in the posterior paramedian right lumbar spine soft tissues. Lumbosacral fixation hardware. Multilevel degenerative changes spine. IMPRESSION: 1. Acute cholecystitis, without evidence of perforation. 2. No evidence of bowel obstruction. 3.  Aortic Atherosclerosis (ICD10-I70.0). Electronically Signed   By: Dellis Filbert  Nance Pew M.D.   On: 08/19/2022 08:17   DG Chest Port 1 View  Result Date: 08/19/2022 CLINICAL DATA:  Sepsis. EXAM: PORTABLE CHEST 1 VIEW COMPARISON:  August 06, 2022. FINDINGS: Stable cardiomegaly. Right internal jugular dialysis catheter is unchanged in position. Both lungs are clear. The visualized  skeletal structures are unremarkable. IMPRESSION: No active disease. Electronically Signed   By: Marijo Conception M.D.   On: 08/19/2022 08:16    Scheduled Meds:  allopurinol  100 mg Oral Daily   amLODipine  10 mg Oral QPM   [START ON 08/25/2022] calcitRIOL  0.25 mcg Oral Q Sun   carvedilol  6.25 mg Oral BID WC   Chlorhexidine Gluconate Cloth  6 each Topical Q0600   cloNIDine  0.1 mg Oral TID   cycloSPORINE  1 drop Both Eyes BID   hydrALAZINE  100 mg Oral TID   isosorbide mononitrate  60 mg Oral BID   sodium chloride flush  3 mL Intravenous Q12H   Continuous Infusions:  sodium chloride 10 mL/hr at 08/20/22 0003   albumin human 60 mL/hr at 08/20/22 0003   piperacillin-tazobactam (ZOSYN)  IV 2.25 g (08/21/22 0539)     LOS: 2 days   Time spent: 17mn  Rikki Trosper C Hakan Nudelman, DO [facility]  If 7PM-7AM, please contact night-coverage [email or web address]  08/21/2022, 7:47 AM

## 2022-08-21 NOTE — Progress Notes (Signed)
Mobility Specialist - Progress Note   08/21/22 1149  Mobility  Activity Ambulated with assistance in hallway  Level of Assistance Contact guard assist, steadying assist  Assistive Device Front wheel walker  Distance Ambulated (ft) 30 ft  Activity Response Tolerated well  $Mobility charge 1 Mobility    Pt received in bed and agreeable to mobility. No complaints throughout. Left in recliner w/ call bell and all needs met.   Paulla Dolly Mobility Specialist

## 2022-08-22 ENCOUNTER — Inpatient Hospital Stay: Payer: Medicare Other

## 2022-08-22 ENCOUNTER — Inpatient Hospital Stay: Payer: Medicare Other | Admitting: Physician Assistant

## 2022-08-22 DIAGNOSIS — E1122 Type 2 diabetes mellitus with diabetic chronic kidney disease: Secondary | ICD-10-CM | POA: Diagnosis not present

## 2022-08-22 DIAGNOSIS — N186 End stage renal disease: Secondary | ICD-10-CM | POA: Diagnosis not present

## 2022-08-22 DIAGNOSIS — K810 Acute cholecystitis: Secondary | ICD-10-CM | POA: Diagnosis not present

## 2022-08-22 DIAGNOSIS — Z992 Dependence on renal dialysis: Secondary | ICD-10-CM | POA: Diagnosis not present

## 2022-08-22 LAB — COMPREHENSIVE METABOLIC PANEL
ALT: 148 U/L — ABNORMAL HIGH (ref 0–44)
AST: 168 U/L — ABNORMAL HIGH (ref 15–41)
Albumin: 2.2 g/dL — ABNORMAL LOW (ref 3.5–5.0)
Alkaline Phosphatase: 531 U/L — ABNORMAL HIGH (ref 38–126)
Anion gap: 17 — ABNORMAL HIGH (ref 5–15)
BUN: 29 mg/dL — ABNORMAL HIGH (ref 8–23)
CO2: 25 mmol/L (ref 22–32)
Calcium: 9.1 mg/dL (ref 8.9–10.3)
Chloride: 95 mmol/L — ABNORMAL LOW (ref 98–111)
Creatinine, Ser: 3.9 mg/dL — ABNORMAL HIGH (ref 0.61–1.24)
GFR, Estimated: 15 mL/min — ABNORMAL LOW (ref >60)
Glucose, Bld: 83 mg/dL (ref 70–99)
Potassium: 3.4 mmol/L — ABNORMAL LOW (ref 3.5–5.1)
Sodium: 137 mmol/L (ref 135–145)
Total Bilirubin: 6.8 mg/dL — ABNORMAL HIGH (ref 0.3–1.2)
Total Protein: 6.7 g/dL (ref 6.5–8.1)

## 2022-08-22 LAB — CBC
HCT: 25 % — ABNORMAL LOW (ref 39.0–52.0)
Hemoglobin: 8.1 g/dL — ABNORMAL LOW (ref 13.0–17.0)
MCH: 31 pg (ref 26.0–34.0)
MCHC: 32.4 g/dL (ref 30.0–36.0)
MCV: 95.8 fL (ref 80.0–100.0)
Platelets: 183 10*3/uL (ref 150–400)
RBC: 2.61 MIL/uL — ABNORMAL LOW (ref 4.22–5.81)
RDW: 16.4 % — ABNORMAL HIGH (ref 11.5–15.5)
WBC: 11.4 10*3/uL — ABNORMAL HIGH (ref 4.0–10.5)
nRBC: 0.3 % — ABNORMAL HIGH (ref 0.0–0.2)

## 2022-08-22 MED ORDER — HYDROMORPHONE HCL 1 MG/ML IJ SOLN
0.5000 mg | Freq: Three times a day (TID) | INTRAMUSCULAR | Status: AC | PRN
  Administered 2022-08-22 – 2022-08-23 (×3): 0.5 mg via INTRAVENOUS
  Filled 2022-08-22 (×4): qty 0.5

## 2022-08-22 MED ORDER — AMOXICILLIN-POT CLAVULANATE 500-125 MG PO TABS
1.0000 | ORAL_TABLET | Freq: Two times a day (BID) | ORAL | Status: DC
Start: 2022-08-22 — End: 2022-08-24
  Administered 2022-08-22 – 2022-08-23 (×2): 500 mg via ORAL
  Filled 2022-08-22 (×3): qty 1

## 2022-08-22 NOTE — Evaluation (Signed)
Physical Therapy Evaluation Patient Details Name: [name] MRN: [id number] DOB: 1946-08-12 Today's Date: 08/22/2022  History of Present Illness  Pt is a 76 y/o male admitted from SNF secondary to acute cholecystitis. Pt is s/p percutaneous cholecystostomy.  Clinical Impression  Pt admitted secondary to problem above with deficits below. Pt requiring mod A for bed mobility and min A for short distance ambulation using RW. Notable fatigue, which limited mobility tolerance. Pt with poor safety awareness and adamant about returning home. Report niece can assist, but unsure of accuracy. Recommending return to SNF to increase independence and safety, however, if pt refuses, will need max HH services. Will continue to follow acutely.        Recommendations for follow up therapy are one component of a multi-disciplinary discharge planning process, led by the attending physician.  Recommendations may be updated based on patient status, additional functional criteria and insurance authorization.  Follow Up Recommendations Skilled nursing-short term rehab (<3 hours/day) (unless pt refuses, then will need max HH) Can patient physically be transported by private vehicle: Yes    Assistance Recommended at Discharge Frequent or constant Supervision/Assistance  Patient can return home with the following  A lot of help with walking and/or transfers;A lot of help with bathing/dressing/bathroom;Assistance with cooking/housework;Help with stairs or ramp for entrance;Assist for transportation    Equipment Recommendations Wheelchair cushion (measurements PT);Wheelchair (measurements PT)  Recommendations for Other Services       Functional Status Assessment Patient has had a recent decline in their functional status and demonstrates the ability to make significant improvements in function in a reasonable and predictable amount of time.     Precautions / Restrictions Precautions Precautions:  Fall Restrictions Weight Bearing Restrictions: No      Mobility  Bed Mobility Overal bed mobility: Needs Assistance Bed Mobility: Supine to Sit     Supine to sit: Mod assist     General bed mobility comments: Mod A for trunk assist to come to sitting.    Transfers Overall transfer level: Needs assistance Equipment used: Rolling walker (2 wheels) Transfers: Sit to/from Stand Sit to Stand: Min assist           General transfer comment: Min A for lift assist and steadying. Increased time required.    Ambulation/Gait Ambulation/Gait assistance: Min assist Gait Distance (Feet): 15 Feet Assistive device: Rolling walker (2 wheels) Gait Pattern/deviations: Step-through pattern, Decreased stride length Gait velocity: decreased     General Gait Details: Slow, very cautious gait. Increased fatigued which limited mobility. Min A for steadying.  Stairs            Wheelchair Mobility    Modified Rankin (Stroke Patients Only)       Balance Overall balance assessment: Needs assistance Sitting-balance support: No upper extremity supported, Feet supported Sitting balance-Leahy Scale: Fair     Standing balance support: Reliant on assistive device for balance Standing balance-Leahy Scale: Poor Standing balance comment: Reliant on UE support                             Pertinent Vitals/Pain Pain Assessment Pain Assessment: No/denies pain    Home Living Family/patient expects to be discharged to:: Private residence Living Arrangements: Alone Available Help at Discharge: Family;Available PRN/intermittently Type of Home: House Home Access: Stairs to enter Entrance Stairs-Rails: Right;Left;Can reach both Entrance Stairs-Number of Steps: 3   Home Layout: One level Home Equipment: Conservation officer, nature (2 wheels);Cane - single point;Grab  bars - tub/shower;BSC/3in1 Additional Comments: Pt reports niece can stay with him, but unsure of accuracy    Prior  Function Prior Level of Function : Needs assist             Mobility Comments: Pt was [facility] with [facility] at [facility]. Normally uses cane ADLs Comments: SNF staff assisted with ADLs.     Hand Dominance        Extremity/Trunk Assessment   Upper Extremity Assessment Upper Extremity Assessment: Defer to OT evaluation    Lower Extremity Assessment Lower Extremity Assessment: Generalized weakness    Cervical / Trunk Assessment Cervical / Trunk Assessment: Kyphotic  Communication   Communication: No difficulties  Cognition Arousal/Alertness: Awake/alert Behavior During Therapy: WFL for tasks assessed/performed Overall Cognitive Status: No family/caregiver present to determine baseline cognitive functioning                                 General Comments: Decreased safety awareness.        General Comments General comments (skin integrity, edema, etc.): Pt adamant he wants to go home and not to SNF. Michela Pitcher his niece can help him, but wanted an aide 3 days/week    Exercises     Assessment/Plan    PT Assessment Patient needs continued PT services  PT Problem List Decreased strength;Decreased mobility;Decreased activity tolerance;Decreased balance;Cardiopulmonary status limiting activity;Decreased skin integrity;Decreased safety awareness;Decreased cognition       PT Treatment Interventions DME instruction;Therapeutic exercise;Gait training;Balance training;Functional mobility training;Therapeutic activities;Patient/family education;Stair training;Neuromuscular re-education    PT Goals (Current goals can be found in the Care Plan section)  Acute Rehab PT Goals Patient Stated Goal: to go home PT Goal Formulation: With patient Time For Goal Achievement: 09/05/22 Potential to Achieve Goals: Fair    Frequency Min 3X/week     Co-evaluation               AM-PAC PT "6 Clicks" Mobility  Outcome Measure Help needed turning from your back to your side  while in a flat bed without using bedrails?: A Little Help needed moving from lying on your back to sitting on the side of a flat bed without using bedrails?: A Lot Help needed moving to and from a bed to a chair (including a wheelchair)?: A Little Help needed standing up from a chair using your arms (e.g., wheelchair or bedside chair)?: A Lot Help needed to walk in hospital room?: A Little Help needed climbing 3-5 steps with a railing? : Total 6 Click Score: 14    End of Session Equipment Utilized During Treatment: Gait belt Activity Tolerance: Patient limited by fatigue Patient left: in chair;with call bell/phone within reach;with chair alarm set Nurse Communication: Mobility status PT Visit Diagnosis: Unsteadiness on feet (R26.81);Other abnormalities of gait and mobility (R26.89);Muscle weakness (generalized) (M62.81)    Time: 4239-5320 PT Time Calculation (min) (ACUTE ONLY): 18 min   Charges:   PT Evaluation $PT Eval Moderate Complexity: 1 Mod          Reuel Derby, PT, DPT  Acute Rehabilitation Services  Office: [phone]   Rudean Hitt 08/22/2022, 10:49 AM

## 2022-08-22 NOTE — Evaluation (Signed)
Occupational Therapy Evaluation Patient Details Name: [name] MRN: [id number] DOB: [birth year] Today's Date: 08/22/2022   History of Present Illness Pt is a 76 y/o male admitted from SNF secondary to acute cholecystitis. Pt is s/p percutaneous cholecystostomy.   Clinical Impression   Patient admitted for the diagnosis above.  PTA he lived with his niece, who was able to provide supportive assist.  Currently he is needing up to Mod A for basic mobility, and up to Max A for lower body ADL at bedlevel.  Niece is unable to provide the appropriate assist at home, so short term rehab at a local SNF is recommended.  OT will continue efforts in the acute setting.        Recommendations for follow up therapy are one component of a multi-disciplinary discharge planning process, led by the attending physician.  Recommendations may be updated based on patient status, additional functional criteria and insurance authorization.   Follow Up Recommendations  Skilled nursing-short term rehab (<3 hours/day)    Assistance Recommended at Discharge Frequent or constant Supervision/Assistance  Patient can return home with the following A lot of help with walking and/or transfers;A lot of help with bathing/dressing/bathroom;Assistance with cooking/housework;Assistance with feeding;Assist for transportation;Help with stairs or ramp for entrance    Functional Status Assessment  Patient has had a recent decline in their functional status and demonstrates the ability to make significant improvements in function in a reasonable and predictable amount of time.  Equipment Recommendations  None recommended by OT    Recommendations for Other Services       Precautions / Restrictions Precautions Precautions: Fall Restrictions Weight Bearing Restrictions: No      Mobility Bed Mobility Overal bed mobility: Needs Assistance Bed Mobility: Supine to Sit, Sit to Supine     Supine to sit: Mod assist Sit to  supine: Max assist        Transfers Overall transfer level: Needs assistance Equipment used: Rolling walker (2 wheels) Transfers: Sit to/from Stand Sit to Stand: Mod assist                  Balance Overall balance assessment: Needs assistance Sitting-balance support: No upper extremity supported, Feet supported Sitting balance-Leahy Scale: Fair     Standing balance support: Reliant on assistive device for balance Standing balance-Leahy Scale: Poor                             ADL either performed or assessed with clinical judgement   ADL Overall ADL's : Needs assistance/impaired     Grooming: Moderate assistance;Sitting   Upper Body Bathing: Moderate assistance;Sitting   Lower Body Bathing: Maximal assistance;Sitting/lateral leans;Bed level   Upper Body Dressing : Moderate assistance;Sitting   Lower Body Dressing: Maximal assistance;Bed level   Toilet Transfer: Moderate assistance;BSC/3in1;Stand-pivot                   Vision Patient Visual Report: No change from baseline       Perception Perception Perception: Within Functional Limits   Praxis Praxis Praxis: Intact    Pertinent Vitals/Pain Pain Assessment Pain Assessment: Faces Faces Pain Scale: Hurts even more Pain Location: abdomen Pain Descriptors / Indicators: Grimacing, Guarding Pain Intervention(s): Monitored during session     Hand Dominance Right   Extremity/Trunk Assessment Upper Extremity Assessment Upper Extremity Assessment: Defer to OT evaluation RUE Deficits / Details: 50 to 75% of available P/ROm for shoudler flexion. Partial active flexion of  R UE. Very weak but able to use functionally with RW during transfer. LUE Deficits / Details: L P/ROM within 75 to 100% of available range for shoulder flexion. able to lift L UE to RW. Noted to be missing thumb. Limited grip on RW likely for this reason.   Lower Extremity Assessment Lower Extremity Assessment: Defer to  PT evaluation   Cervical / Trunk Assessment Cervical / Trunk Assessment: Kyphotic   Communication Communication Communication: No difficulties   Cognition Arousal/Alertness: Awake/alert Behavior During Therapy: WFL for tasks assessed/performed Overall Cognitive Status: Within Functional Limits for tasks assessed                                       General Comments  Pt adamant he wants to go home and not to SNF. Michela Pitcher his niece can help him, but wanted an aide 3 days/week    Exercises     Shoulder Instructions      Home Living Family/patient expects to be discharged to:: Private residence Living Arrangements: Alone Available Help at Discharge: Family;Available PRN/intermittently Type of Home: House Home Access: Stairs to enter [employer] of Steps: 3 Entrance Stairs-Rails: Right;Left;Can reach both Home Layout: One level     Bathroom Shower/Tub: Occupational psychologist: Standard Bathroom Accessibility: Yes   Home Equipment: Conservation officer, nature (2 wheels);Cane - single point;Grab bars - tub/shower;BSC/3in1   Additional Comments: Pt reports niece can stay with him, but unsure of accuracy      Prior Functioning/Environment Prior Level of Function : Needs assist             Mobility Comments: Pt was [facility] with therapies at [facility]. Normally uses cane ADLs Comments: SNF staff assisted with ADLs.        OT Problem List: Decreased strength;Decreased activity tolerance;Decreased range of motion;Impaired balance (sitting and/or standing);Impaired UE functional use      OT Treatment/Interventions: Self-care/ADL training;Therapeutic exercise;Patient/family education;Balance training;Therapeutic activities    OT Goals(Current goals can be found in the care plan section) Acute Rehab OT Goals Patient Stated Goal: Return home OT Goal Formulation: With patient Time For Goal Achievement: 09/05/22 Potential to Achieve Goals: Good ADL  Goals Pt Will Perform Grooming: with supervision;standing Pt Will Perform Upper Body Dressing: with set-up;sitting Pt Will Perform Lower Body Dressing: with min assist;sit to/from stand Pt Will Transfer to Toilet: with min guard assist;ambulating;regular height toilet  OT Frequency: Min 2X/week    Co-evaluation              AM-PAC OT "6 Clicks" Daily Activity     Outcome Measure Help from another person eating meals?: A Little Help from another person taking care of personal grooming?: A Lot Help from another person toileting, which includes using toliet, bedpan, or urinal?: A Lot Help from another person bathing (including washing, rinsing, drying)?: A Lot Help from another person to put on and taking off regular upper body clothing?: A Lot Help from another person to put on and taking off regular lower body clothing?: A Lot 6 Click Score: 13   End of Session Equipment Utilized During Treatment: Rolling walker (2 wheels) Nurse Communication: Mobility status  Activity Tolerance: Patient limited by pain Patient left: in bed;with call bell/phone within reach;with bed alarm set  OT Visit Diagnosis: Unsteadiness on feet (R26.81);Other abnormalities of gait and mobility (R26.89);Muscle weakness (generalized) (M62.81)  Time: 1324-1340 OT Time Calculation (min): 16 min Charges:  OT General Charges $OT Visit: 1 Visit OT Evaluation $OT Eval Moderate Complexity: 1 Mod  08/22/2022  RP, OTR/L  Acute Rehabilitation Services  Office:  [phone]   [id number] W [id number] 08/22/2022, 1:47 PM

## 2022-08-22 NOTE — TOC Initial Note (Signed)
Transition of Care [facility]) - Initial/Assessment Note    Patient Details  Name: [name] MRN: [id number] Date of Birth: [birth year]  [facility]) CM/SW Contact:    Marilu Favre, RN Phone Number: 08/22/2022, 12:14 PM  Clinical Narrative:                 Spoke to patient and his brother [name] at bedside.   Discussed PT recommendation for SNF.   Both in agreement for SNF, however patient does not want to return to [facility]   Brother interested in [facility].   Patient stated his first choice is [facility] in [place] .  Patient agreeable to be faxed out to surrounding areas .   SW will begin SNF process   Expected Discharge Plan: [place]     Patient Goals and CMS Choice Patient states their goals for this hospitalization and ongoing recovery are:: different SNF      Expected Discharge Plan and Services Expected Discharge Plan: [place]     Post Acute Care Choice: [place]                   DME Arranged: N/A         HH Arranged: NA          Prior Living Arrangements/Services     Patient language and need for interpreter reviewed:: Yes Do you feel safe going back to the place where you live?: Yes      Need for Family Participation in Patient Care: Yes (Comment) Care giver support system in place?: No (comment) Current home services: DME Criminal Activity/Legal Involvement Pertinent to Current Situation/Hospitalization: No - Comment as needed  Activities of Daily Living Home Assistive Devices/Equipment: Walker (specify type) ADL Screening (condition at time of admission) Patient's cognitive ability adequate to safely complete daily activities?: Yes Is the patient deaf or have difficulty hearing?: No Does the patient have difficulty seeing, even when wearing glasses/contacts?: No Does the patient have difficulty concentrating, remembering, or making decisions?: Yes Patient able  to express need for assistance with ADLs?: Yes Does the patient have difficulty dressing or bathing?: Yes Independently performs ADLs?: No Communication: Independent Dressing (OT): Needs assistance Is this a change from baseline?: Pre-admission baseline Grooming: Needs assistance Is this a change from baseline?: Pre-admission baseline Feeding: Independent Bathing: Needs assistance Is this a change from baseline?: Pre-admission baseline Toileting: Needs assistance Is this a change from baseline?: Pre-admission baseline In/Out Bed: Needs assistance Is this a change from baseline?: Pre-admission baseline Walks in Home: Needs assistance Is this a change from baseline?: Pre-admission baseline Does the patient have difficulty walking or climbing stairs?: No Weakness of Legs: Both Weakness of Arms/Hands: None  Permission Sought/Granted   Permission granted to share information with : Yes, Verbal Permission Granted  Share Information with NAME: brother Burlon Centrella           Emotional Assessment Appearance:: Appears stated age     Orientation: : Oriented to Self, Oriented to Place, Oriented to  Time, Oriented to Situation Alcohol / Substance Use: Not Applicable Psych Involvement: No (comment)  Admission diagnosis:  Acute cholecystitis [K81.0] Sepsis with acute renal failure without septic shock, due to unspecified organism, unspecified acute renal failure type ([place]) [A41.9, R65.20, N17.9] Patient Active Problem List   Diagnosis Date Noted   Acute cholecystitis 08/19/2022   ESRD (end stage renal disease) ([place]) 08/07/2022   HFrEF (heart failure with reduced ejection fraction) (  [place])    (HFpEF) heart failure with preserved ejection fraction ([place]) 07/29/2022   Hypokalemia 07/29/2022   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   Acute respiratory failure with hypoxia ([place])    B12 deficiency 07/18/2022   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/2022   Diabetic neuropathy ([place]) 05/10/2022    Elevated troponin 05/10/2022   Type 2 diabetes mellitus with hyperglycemia ([place]) 05/10/2022   Other intervertebral disc degeneration, lumbar region 02/06/2022   Chronic gout, unspecified, 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ophi) 02/06/2022   Body mass index (BMI) 35.0-35.9, adult 02/06/2022   Obesity 02/06/2022   Primary generalized (osteo)arthritis 02/06/2022   Chest pain 02/06/2022   Lumbago with sciatica, right side 08/28/2021   Weakness of both lower extremities 08/28/2021   Body mass index (BMI) 33.0-33.9, adult 08/28/2021   Constipation 02/15/2021   Abdominal pain 01/02/2021   Diabetes mellitus ([place]) 11/09/2020   Hypertensive disorder 11/09/2020   Hypercholesterolemia 11/09/2020   H/O adenomatous polyp of colon 05/05/2020   Iron deficiency anemia 05/05/2020   Chronic kidney disease, stage IV (severe) ([place]) 12/14/2019   Acute gouty arthritis 10/25/2019   Leukocytosis 04/02/2019   Cellulitis of right hand 04/01/2019   Osteoarthritis of ankle and foot 02/08/2019   CHF (congestive heart failure) ([place]) 07/05/2015   Acute congestive heart failure ([place]) 07/04/2015   Acute on chronic diastolic congestive heart failure ([place]) 07/04/2015   Mixed hyperlipidemia 07/04/2015   Anxiety state 07/04/2015   GERD (gastroesophageal reflux disease) 07/04/2015   Heart block 07/04/2015   Gastroesophageal reflux disease 01/56/1537   Diastolic CHF, chronic (HCC) 03/11/2015   Chronic diastolic heart failure ([place]) 03/11/2015   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   Knee stiffness 01/13/2014   Gout 12/11/2013   Expected blood loss anemia 11/17/2013   S/P right TKA 11/15/2013   Anemia, normocytic normochromic 01/12/2013   Acute kidney injury superimposed on chronic kidney disease ([place]) 12/10/2012   Diabetes mellitus type 2 in obese ([place]) 12/10/2012   CAD S/P percutaneous coronary angioplasty 11/24/2012   Overweight(278.02) 11/17/2012   Chronic kidney disease, stage 3, cardiorenal syndrome 08/23/2011   Chronic kidney disease,  stage 3 unspecified ([place]) 08/23/2011   Essential hypertension    PCP:  Redmond School, MD Pharmacy:   [place], [place] [address] Phone: [phone] Fax: [phone]     Social Determinants of Health (SDOH) Interventions    Readmission Risk Interventions    08/02/2022    2:23 PM  Readmission Risk Prevention Plan  Transportation Screening Complete  Medication Review (RN Care Manager) Complete  HRI or Home Care Consult Complete  SW Recovery Care/Counseling Consult Complete  Palliative Care Screening Not Applicable  Skilled Nursing Facility Complete

## 2022-08-22 NOTE — TOC Progression Note (Addendum)
Transition of Care [facility]) - Progression Note    Patient Details  Name: [name] MRN: [id number] Date of Birth: [birth year]  Transition of Care [facility]) CM/SW [place], [place] Phone Number: 08/22/2022, 4:34 PM  Clinical Narrative:    CSW spoke with pt brother about not having a bed at [facility] as her requested but provided his other SNF options ([facility], [facility], and [facility]). Pt wanted [facility] as second choice.  CSW contacted [place] with [facility] and she was able to offer a bed and stated pt could keep HD center, they would transport. Pt auth started ref# [id number].  CSW will continue to follow.   Expected Discharge Plan: [place]    Expected Discharge Plan and Services Expected Discharge Plan: Skilled Nursing Facility     Post Acute Care Choice: [place]                   DME Arranged: N/A         HH Arranged: NA           Social Determinants of Health (SDOH) Interventions    Readmission Risk Interventions    08/02/2022    2:23 PM  Readmission Risk Prevention Plan  Transportation Screening Complete  Medication Review (RN Care Manager) Complete  HRI or Home Care Consult Complete  SW Recovery Care/Counseling Consult Complete  Palliative Care Screening Not Applicable  Skilled Nursing Facility Complete

## 2022-08-22 NOTE — Progress Notes (Signed)
[facility] Progress Note   76 y.o. 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  In the ED, VSS, temp 100.3 and was ill-appearing.  Labs notable for AST 711, ALT 273, Alk phos 613, T bili 3.3, WBC 24.9, Hgb 9.1, INR 1.6.  CT scan of abdomen and pelvis consistent with acute cholecystitis.  He was admitted for IV antibiotics with plans to transfer to [facility] for IR to place cholecystostomy tube.    Dialysis Orders: Center: [facility]  on MWF . EDW 103.5kg HD Bath 2K/2.5Ca  Time 4 hours Heparin none. Access RIJ TDC BFR 400 DFR 800     Venofer  100 mg IVP every HD through 09/09/22   Assessment/ Plan:    Acute cholecystitis - too high risk for surgery and plan for cholecystostomy tube and possible MRCP to r/o stones.  U/S neg for choledocholithiasis. Currently started on IV antibiotics.  Avoid nephrotoxic medications including NSAIDs and iodinated intravenous contrast exposure unless the latter is absolutely indicated.   Preferred narcotic agents for pain control are hydromorphone, fentanyl, and methadone. Morphine should not be used.  Avoid Baclofen and avoid oral sodium phosphate and magnesium citrate based laxatives / bowel preps.  Continue strict Input and Output monitoring. Will monitor the patient closely with you and intervene or adjust therapy as indicated by changes in clinical status/labs   ESRD -  Following episode of AKI/CKD stage IV and has been dialysis dependent since 08/06/22.  Plan for HD on MWF schedule.  Tolerated HD 8/28 @ [facility] with 476m HD 8/30 with 800 mL UF.  No absolute indication for RRT and the patient appears to be  comfortable; plan for next HD treatment tomorrow if he's still here.  Weights are all over the place; need floor scale weight.   Hypertension/volume  -  stable but Bp's on low side.  Anemia  - Continue with ESA and transfuse for Hgb <7 and IV iron.   Metabolic bone disease -  meds on hold as pt is npo  Nutrition - npo for now  Subjective:   Main complain is mid abdominal pain; denies nausea/vomiting today. Also denies sob. Pain slightly better than yest; he's not passing flatus and hasn't had a BM.   Objective:   BP (!) 134/119 (BP Location: Left Arm)   Pulse 81   Temp 98 F (36.7 C) (Oral)   Resp 20   Ht 6' 2"  (1.88 m)   Wt 100.3 kg   SpO2 95%   BMI 28.39 kg/m   Intake/Output Summary (Last 24 hours) at 08/22/2022 1012 Last data filed at 08/22/2022 0541 Gross per 24 hour  Intake 359.14 ml  Output 880 ml  Net -520.86 ml   Weight change:   Physical Exam: General appearance: fatigued, mild distress from abd pain and ill-appearing Head: NCAT Resp: clear to auscultation bilaterally Cardio: regular rate and rhythm, S1, S2 normal, no murmur, click, rub or gallop GI: +BS, soft, + tenderness and guarding in mid abd, no rebound, cholecystostomy tube draining.  Extremities: edema 1+ bilateral ankle edema Access: RIJ TC  Imaging: IR Perc Cholecystostomy  Result Date: 08/20/2022 INDICATION: 76year old male with acute calculus cholecystitis, poor surgical candidate. EXAM: Ultrasound and fluoroscopic guided percutaneous cholecystostomy placement MEDICATIONS: 2.25 g Zosyn, intravenous; The antibiotic was administered within an appropriate time frame prior to the initiation of the procedure. ANESTHESIA/SEDATION: Moderate (conscious) sedation was employed during  this procedure. A total of Versed 1 mg and Fentanyl 50 mcg was administered intravenously. Moderate Sedation Time: 11 minutes. The patient's level of consciousness and vital signs were monitored continuously by radiology nursing throughout the procedure under my direct supervision. FLUOROSCOPY TIME:  Two mGy COMPLICATIONS: None immediate. PROCEDURE: Informed written consent was obtained from the patient after a thorough discussion of the procedural risks, benefits and alternatives.  All questions were addressed. Maximal Sterile Barrier Technique was utilized including caps, mask, sterile gowns, sterile gloves, sterile drape, hand hygiene and skin antiseptic. A timeout was performed prior to the initiation of the procedure. The patient was placed supine on the angiographic table. The patient's right upper quadrant was then prepped and draped in normal sterile fashion with maximum sterile barrier. Ultrasound demonstrates a distended gallbladder with multiple internal echoes compatible with cholelithiasis. Subdermal Local anesthesia was provided at the planned skin entry site. Under ultrasound guidance, deeper local anesthetic was provided through intercostal muscles and along the liver capsule. Ultrasound was used to puncture the gallbladder using a 18 gauge trocar needle via a subhepatic/trans peritoneal approach with visualization of the lung treated to the gallbladder. An Amplatz wire was advanced into the lumen and a transition dilator placed. A gentle hand injection of contrast was performed. Cholecystogram demonstrates irregular filling defects consistent with gallstones. The cystic duct is obstructed. A 0.035 inch exchange wire was placed in the tract was dilated. A 10 French multipurpose drainage catheter was advanced into the gallbladder lumen. The drain was then secured in place using a 0-silk suture and a Stayfix device. A sterile dressing was applied. The tube was placed to bag drainage. A culture was sent to the lab for analysis. The patient tolerated procedure well without evidence of immediate complication was transferred back to the floor in stable condition. IMPRESSION: Successful placement of percutaneous, subhepatic/transperitoneal cholecystostomy tube. PLAN: Pending candidacy for cholecystectomy, plan for return in 8 weeks for initial routine cholecystostomy tube check and exchange. If deemed not a surgical candidate, the patient will be evaluated for possible candidacy for  percutaneous choledochoscopic-assisted (Spyglass) gallstone retrieval and hopeful of eventual tube removal. Ruthann Cancer, MD [facility] Radiology Electronically Signed   By: Ruthann Cancer M.D.   On: 08/20/2022 14:02    Labs: BMET Recent Labs  Lab 08/19/22 0732 08/20/22 0114 08/21/22 0303 08/21/22 1336 08/22/22 0118  NA 135 137 140 140 137  K 4.3 4.1 3.8 4.0 3.4*  CL 92* 95* 97* 95* 95*  CO2 27 25 26 26 25   GLUCOSE 133* 112* 85 98 83  BUN 63* 31* 50* 58* 29*  CREATININE 4.90* 3.35* 5.49* 6.50* 3.90*  CALCIUM 9.2 9.0 9.2 9.6 9.1  PHOS  --   --   --  4.6  --    CBC Recent Labs  Lab 08/19/22 0732 08/20/22 0114 08/21/22 0303 08/22/22 0118  WBC 24.9* 19.4* 12.3* 11.4*  NEUTROABS 20.9*  --   --   --   HGB 9.1* 9.3* 8.2* 8.1*  HCT 28.2* 27.6* 25.3* 25.0*  MCV 98.6 94.8 95.8 95.8  PLT 215 186 191 183    Medications:     allopurinol  100 mg Oral Daily   amLODipine  10 mg Oral QPM   [START ON 08/25/2022] calcitRIOL  0.25 mcg Oral Q Sun   carvedilol  6.25 mg Oral BID WC   Chlorhexidine Gluconate Cloth  6 each Topical Q0600   cloNIDine  0.1 mg Oral TID   cycloSPORINE  1 drop Both Eyes BID   hydrALAZINE  100 mg Oral TID   isosorbide mononitrate  60 mg Oral BID   sodium chloride flush  3 mL Intravenous Q12H      Otelia Santee, MD 08/22/2022, 10:12 AM

## 2022-08-22 NOTE — Progress Notes (Signed)
Referring Physician(s): Murphy.   Supervising Physician: Jacqulynn Cadet  Patient Status:  [facility] - In-pt  Chief Complaint:  Cholecystitis = s/p perc chole on 8/29 by Dr. Serafina Royals.   Subjective:  Patient sitting in a chair, NAD but appears to be tired. Breakfast tray at bedside, he is tolerating oral intake. States that he is still in pain due to his gallbladder.  Denies n/v/fever/chills.  He repeatedly states that "I cannot go home."    Allergies: Metformin, Niacin, and Niacin and related  Medications: Prior to Admission medications   Medication Sig Start Date End Date Taking? Authorizing Provider  acarbose (PRECOSE) 50 MG tablet Take 50 mg by mouth in the morning, at noon, and at bedtime.  10/20/17  Yes [provider]  acetaminophen (TYLENOL) 500 MG tablet Take 2 tablets (1,000 mg total) by mouth every 6 (six) hours. 08/10/22  Yes Shahmehdi, Seyed A, MD  allopurinol (ZYLOPRIM) 100 MG tablet Take 100 mg by mouth daily. 08/17/20  Yes [provider]  amLODipine (NORVASC) 10 MG tablet Take 10 mg by mouth every evening.    Yes [provider]  aspirin EC 81 MG tablet Take 1 tablet (81 mg total) by mouth daily. 09/14/19  Yes Herminio Commons, MD  calcitRIOL (ROCALTROL) 0.25 MCG capsule Take 0.25 mcg by mouth every Sunday.  12/13/16  Yes [provider]  carboxymethylcellulose (REFRESH PLUS) 0.5 % SOLN Place 1 drop into both eyes daily as needed (dry eyes).   Yes [provider]  carvedilol (COREG) 6.25 MG tablet Take 6.25 mg by mouth 2 (two) times daily with a meal. 04/18/22 04/18/23 Yes [provider]  cholecalciferol (VITAMIN D) 1000 units tablet Take 1,000 Units daily by mouth.   Yes [provider]  cloNIDine (CATAPRES) 0.1 MG tablet Take 0.1 mg by mouth 3 (three) times daily. 02/15/20  Yes [provider]  insulin NPH Human (NOVOLIN N) 100 UNIT/ML injection Inject  0.18 mLs (18 Units total) into the skin every morning. Ands syringes 1/day Patient taking differently: Inject 18 Units into the skin daily. 06/27/21  Yes Renato Shin, MD  isosorbide mononitrate (IMDUR) 60 MG 24 hr tablet Take 1 tablet (60 mg total) by mouth 2 (two) times daily. 06/26/22 06/21/23 Yes Branch, Alphonse Guild, MD  polyethylene glycol (MIRALAX / GLYCOLAX) 17 g packet Take 17 g by mouth daily. 08/11/22  Yes Shahmehdi, Seyed A, MD  pravastatin (PRAVACHOL) 40 MG tablet Take 1 tablet (40 mg total) by mouth every evening. 07/31/21 02/09/28 Yes Imogene Burn, PA-C  RESTASIS 0.05 % ophthalmic emulsion Place 1 drop into both eyes 2 (two) times daily. 06/19/22  Yes [provider]  senna (SENOKOT) 8.6 MG TABS tablet Take 2 tablets (17.2 mg total) by mouth 2 (two) times daily. 08/10/22  Yes Shahmehdi, Seyed A, MD  sitaGLIPtin (JANUVIA) 25 MG tablet Take 25 mg by mouth daily.   Yes [provider]  traMADol (ULTRAM) 50 MG tablet Take 50 mg by mouth 4 (four) times daily as needed for pain. 08/14/22  Yes [provider]  epoetin alfa (EPOGEN) 3000 UNIT/ML injection Inject into the skin. 07/03/22   [provider]  nitroGLYCERIN (NITROSTAT) 0.4 MG SL tablet Place 1 tablet (0.4 mg total) under the tongue every 5 (five) minutes as needed for chest pain. PLACE ONE TABLET UNDER THE TONGUE EVERY 5 MINUTES FOR 3 DOSES AS NEEDED Strength: 0.4 mg 02/07/22   Arnoldo Lenis, MD  TRUE METRIX BLOOD GLUCOSE TEST test strip USE TO TEST BLOOD SUGAR UP TO TWICE DAILY OR AS DIRECTED 08/31/21   Renato Shin, MD     Vital Signs: BP (!) 134/119 (BP Location: Left Arm)   Pulse 81   Temp 98 F (36.7 C) (Oral)   Resp 20   Ht 6' 2"  (1.88 m)   Wt 221 lb 1.9 oz (100.3 kg)   SpO2 95%   BMI 28.39 kg/m   Physical Exam Constitutional:      Appearance: Normal appearance.  HENT:     Head: Normocephalic and atraumatic.  Cardiovascular:     Rate and Rhythm: Normal rate.  Pulmonary:      Effort: Pulmonary effort is normal. No respiratory distress.  Skin:    General: Skin is warm and dry.     Coloration: Skin is not jaundiced.     Comments: Positive RUQ drain to a gravity bag. Site is unremarkable with no erythema, edema, tenderness, bleeding or drainage. Suture and stat lock in place. Dressing is clean, dry, and intact. ~ 50 ml of  dark bile colored fluid noted in the bag. Drain aspirates and flushes well.    Neurological:     Mental Status: He is alert and oriented to person, place, and time.  Psychiatric:        Mood and Affect: Mood normal.        Behavior: Behavior normal.     Imaging: IR Perc Cholecystostomy  Result Date: 08/20/2022 INDICATION: 76 year old male with acute calculus cholecystitis, poor surgical candidate. EXAM: Ultrasound and fluoroscopic guided percutaneous cholecystostomy placement MEDICATIONS: 2.25 g Zosyn, intravenous; The antibiotic was administered within an appropriate time frame prior to the initiation of the procedure. ANESTHESIA/SEDATION: Moderate (conscious) sedation was employed during this procedure. A total of Versed 1 mg and Fentanyl 50 mcg was administered intravenously. Moderate Sedation Time: 11 minutes. The patient's level of consciousness and vital signs were monitored continuously by radiology nursing throughout the procedure under my direct supervision. FLUOROSCOPY TIME:  Two mGy COMPLICATIONS: None immediate. PROCEDURE: Informed written consent was obtained from the patient after a thorough discussion of the procedural risks, benefits and alternatives. All questions were addressed. Maximal Sterile Barrier Technique was utilized including caps, mask, sterile gowns, sterile gloves, sterile drape, hand hygiene and skin antiseptic. A timeout was performed prior to the initiation of the procedure. The patient was placed supine on the angiographic table. The patient's right upper quadrant was then prepped and draped in normal sterile fashion with  maximum sterile barrier. Ultrasound demonstrates a distended gallbladder with multiple internal echoes compatible with cholelithiasis. Subdermal Local anesthesia was provided at the planned skin entry site. Under ultrasound guidance, deeper local anesthetic was provided through intercostal muscles and along the liver capsule. Ultrasound was used to puncture the gallbladder using a 18 gauge trocar needle via a subhepatic/trans peritoneal approach with visualization of the lung treated to the gallbladder. An Amplatz wire was advanced into the lumen and a transition dilator placed. A gentle hand injection of contrast was performed. Cholecystogram demonstrates irregular filling defects consistent with gallstones. The cystic duct is obstructed. A 0.035 inch exchange wire was placed in the tract was dilated. A 10 French multipurpose drainage catheter was advanced into the gallbladder lumen. The drain was then secured in place using a 0-silk suture and a Stayfix device. A sterile dressing was applied. The tube was placed to bag drainage. A culture was sent to the lab for analysis. The patient tolerated procedure well  without evidence of immediate complication was transferred back to the floor in stable condition. IMPRESSION: Successful placement of percutaneous, subhepatic/transperitoneal cholecystostomy tube. PLAN: Pending candidacy for cholecystectomy, plan for return in 8 weeks for initial routine cholecystostomy tube check and exchange. If deemed not a surgical candidate, the patient will be evaluated for possible candidacy for percutaneous choledochoscopic-assisted (Spyglass) gallstone retrieval and hopeful of eventual tube removal. Ruthann Cancer, MD [facility] Electronically Signed   By: Ruthann Cancer M.D.   On: 08/20/2022 14:02   US Abdomen Limited RUQ (LIVER/GB)  Result Date: 08/20/2022 CLINICAL DATA:  Choledocholithiasis EXAM: ULTRASOUND ABDOMEN  LIMITED RIGHT UPPER QUADRANT COMPARISON:  CT abdomen/pelvis dated 08/19/2022 FINDINGS: Gallbladder: Cholelithiasis with distension, gallbladder wall thickening, pericholecystic fluid, and positive sonographic Murphy's sign. Common bile duct: Diameter: 4 mm Liver: The upper limits of normal for parenchymal echogenicity. 9 mm hyperechoic lesion in the anterior right hepatic lobe, possibly reflecting a small hemangioma portal vein is patent on color Doppler imaging with normal direction of blood flow towards the liver. Other: None. IMPRESSION: Cholelithiasis with suspected acute cholecystitis. No intrahepatic or extrahepatic ductal dilatation. Common duct measures 4 mm. Electronically Signed   By: Julian Hy M.D.   On: 08/20/2022 01:33   CT ABDOMEN PELVIS WO CONTRAST  Result Date: 08/19/2022 CLINICAL DATA:  emesis and abdominal pain since yesterday. EXAM: CT ABDOMEN AND PELVIS WITHOUT CONTRAST TECHNIQUE: Multidetector CT imaging of the abdomen and pelvis was performed following the standard protocol without IV contrast. RADIATION DOSE REDUCTION: This exam was performed according to the departmental dose-optimization program which includes automated exposure control, adjustment of the mA and/or kV according to patient size and/or use of iterative reconstruction technique. COMPARISON:  CT May 22, 2021. FINDINGS: Lower chest: Scarring/atelectasis in the bilateral lung bases. Hepatobiliary: Scattered bilobar hypodense hepatic lesions are technically too small to accurately characterize an incompletely evaluated on this noncontrast examination but appears similar prior and statistically likely to reflect benign cysts or hemangiomas. Gallbladder is distended with pericholecystic fluid and gallbladder wall thickening consistent with cholecystitis. No biliary ductal dilation. Pancreas: No pancreatic ductal dilation or evidence of acute inflammation. Spleen: Normal in size without focal abnormality. Adrenals/Urinary  Tract: Bilateral adrenal glands appear normal. No hydronephrosis. Bilateral hypodense renal lesions measure fluid density consistent with cysts and considered benign requiring no independent imaging follow-up. Additional bilateral hypodense renal lesions are technically too small to accurately characterize but statistically likely to reflect cysts and considered benign requiring no independent imaging follow-up. Urinary bladder is unremarkable for degree of distension. Stomach/Bowel: No radiopaque enteric contrast material was administered. Stomach is minimally distended limiting evaluation. No pathologic dilation of large or small bowel. Appendix is not confidently identified however there is no pericecal inflammation. Vascular/Lymphatic: Aortic atherosclerosis. No pathologically enlarged abdominal or pelvic lymph nodes. Reproductive: Prostate is mildly enlarged, unchanged. Other: No significant abdominopelvic free fluid. Musculoskeletal: Spinal stimulating generator in the posterior paramedian right lumbar spine soft tissues. Lumbosacral fixation hardware. Multilevel degenerative changes spine. IMPRESSION: 1. Acute cholecystitis, without evidence of perforation. 2. No evidence of bowel obstruction. 3.  Aortic Atherosclerosis (ICD10-I70.0). Electronically Signed   By: Dahlia Bailiff M.D.   On: 08/19/2022 08:17   DG Chest Port 1 View  Result Date: 08/19/2022 CLINICAL DATA:  Sepsis. EXAM: PORTABLE CHEST 1 VIEW COMPARISON:  August 06, 2022. FINDINGS: Stable cardiomegaly. Right internal jugular dialysis catheter is unchanged in position. Both lungs are clear. The visualized skeletal structures are unremarkable. IMPRESSION: No active disease. Electronically Signed  By: Marijo Conception M.D.   On: 08/19/2022 08:16    Labs:  CBC: Recent Labs    08/19/22 0732 08/20/22 0114 08/21/22 0303 08/22/22 0118  WBC 24.9* 19.4* 12.3* 11.4*  HGB 9.1* 9.3* 8.2* 8.1*  HCT 28.2* 27.6* 25.3* 25.0*  PLT 215 186 191 183      COAGS: Recent Labs    02/06/22 1723 05/10/22 0340 08/19/22 0732 08/20/22 0637  INR 1.0  --  1.6* 1.5*  APTT  --  30 38*  --      BMP: Recent Labs    08/20/22 0114 08/21/22 0303 08/21/22 1336 08/22/22 0118  NA 137 140 140 137  K 4.1 3.8 4.0 3.4*  CL 95* 97* 95* 95*  CO2 25 26 26 25   GLUCOSE 112* 85 98 83  BUN 31* 50* 58* 29*  CALCIUM 9.0 9.2 9.6 9.1  CREATININE 3.35* 5.49* 6.50* 3.90*  GFRNONAA 18* 10* 8* 15*     LIVER FUNCTION TESTS: Recent Labs    08/19/22 0732 08/20/22 0114 08/21/22 0303 08/21/22 1336 08/22/22 0118  BILITOT 3.3* 5.1* 7.3*  --  6.8*  AST 711* 402* 257*  --  168*  ALT 273* 252* 195*  --  148*  ALKPHOS 613* 673* 599*  --  531*  PROT 8.2* 7.0 6.8  --  6.7  ALBUMIN 3.3* 2.8* 2.3* 2.3* 2.2*     Assessment:  Acute cholecystitis with Cholelithiasis - s/p percutaneous cholecystostomy on 8/29 by Dr. Serafina Royals.  OP 80 mL, bile in the bag.  Cx growing yeast WBC trending down, LFT improving   Drain Location: RUQ Size: Fr size: 10 Fr Date of placement: 8/29  Currently to: Drain collection device: gravity 24 hour output:  Output by Drain (mL) 08/20/22 0701 - 08/20/22 1900 08/20/22 1901 - 08/21/22 0700 08/21/22 0701 - 08/21/22 1900 08/21/22 1901 - 08/22/22 0700 08/22/22 0701 - 08/22/22 1243  Closed System Drain 1 Lateral;Right Abdomen 10 Fr. 15 130 40 40     Interval imaging/drain manipulation:  None   Current examination: Flushes/aspirates easily.  Insertion site unremarkable. Suture and stat lock in place. Dressed appropriately.   Plan: Continue TID flushes with 5 cc NS. Record output Q shift. Dressing changes QD or PRN if soiled.  Call IR APP or on call IR MD if difficulty flushing or sudden change in drain output.  Repeat imaging/possible drain injection once output < 10 mL/QD (excluding flush material). Consideration for drain removal if output is < 10 mL/QD (excluding flush material), pending discussion with the providing  surgical service.  Discharge planning: Please contact IR APP or on call IR MD prior to patient d/c to ensure appropriate follow up plans are in place. Patient will follow up with IR clinic around October 24th for cholangiogram. IR scheduler will contact patient with date/time of appointment. Patient will need to flush drain QD with 5 cc NS, record output QD, dressing changes every 2-3 days or earlier if soiled.   IR will continue to follow - please call with questions or concerns.    Electronically Signed: Tera Mater, PA-C 08/22/2022, 12:39 PM    I spent a total of 15 Minutes at the the patient's bedside AND on the patient's hospital floor or unit, greater than 50% of which was counseling/coordinating care for f/u Perc Chole.

## 2022-08-22 NOTE — Progress Notes (Signed)
Pharmacy Antibiotic Note  [name] is a 76 y.o. male admitted on 08/19/2022 with  intra-abdominal infection .  Pharmacy has been consulted for Zosyn dosing.  Plan: Continue Zosyn 2.25g Q8H dosed for HD  Height: 6' 2"  (188 cm) Weight: 100.3 kg (221 lb 1.9 oz) IBW/kg (Calculated) : 82.2  Temp (24hrs), Avg:98.3 F (36.8 C), Min:97.8 F (36.6 C), Max:98.8 F (37.1 C)  Recent Labs  Lab 08/19/22 0732 08/19/22 0928 08/20/22 0114 08/21/22 0303 08/21/22 1336 08/22/22 0118  WBC 24.9*  --  19.4* 12.3*  --  11.4*  CREATININE 4.90*  --  3.35* 5.49* 6.50* 3.90*  LATICACIDVEN 1.6 1.3  --   --   --   --      Estimated Creatinine Clearance: 20.1 mL/min (A) (by C-G formula based on SCr of 3.9 mg/dL (H)).    Allergies  Allergen Reactions   Metformin Nausea And Vomiting   Niacin    Niacin And Related Other (See Comments)    Reaction: flushing and burning side effects even with extended release    Antimicrobials this admission: Zosyn  8/28 >>   Microbiology results: 8/28 BCx: NGTD  Thank you for allowing pharmacy to be a part of this patient's care.  Erskine Speed, PharmD Clinical Pharmacist 08/22/2022 12:35 PM

## 2022-08-22 NOTE — Progress Notes (Addendum)
PROGRESS NOTE    [name]  XBW:[id number] DOB: 01-Jan-1946 DOA: 08/19/2022 PCP: Redmond School, MD   Brief Narrative:  [name] is a 76 y.o. male with medical history significant for ESRD on HD Monday Wednesday Friday -recently initiated, CAD, chronic diastolic/systolic CHF, IDDM2, hypertension, dyslipidemia, GERD, gout, and obesity who was recently discharged on 8/19 after admission for chest pain with volume overload.  He was started on hemodialysis after dialysis catheter placement during that admission and was discharged to SNF.    Presents with worsening epigastric abdominal pain noted to be right upper quadrant, imaging and exam concerning for cholecystitis, general surgery recommending percutaneous cholecystostomy tube given high risk for surgery.  Transferred to [facility] for further evaluation, interventional radiology evaluation/procedure as well as to continue hemodialysis.  Patient currently medically stable for discharge - on PO antibiotics - awaiting SNF acceptance. Refusing to return to previous SNF where he was admitted from.   Assessment & Plan:   Principal Problem:   Acute cholecystitis Active Problems:   ESRD (end stage renal disease) (HCC)   CAD S/P percutaneous coronary angioplasty   GERD (gastroesophageal reflux disease)   Diabetes mellitus type 2 in obese (HCC)   Anemia, normocytic normochromic   Essential hypertension   Mixed hyperlipidemia   Chronic diastolic heart failure (HCC)   Chronic gout, unspecified, 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ophi)   Body mass index (BMI) 33.0-33.9, adult  Acute cholecystitis, POA, resolving Sepsis POA, resolved Concurrently elevated LFT/Bilirubin, POA - Appreciate general surgery recommendations: non-operative approach, outpatient follow-up in their office September as scheduled - Interventional radiology consulted, status post percutaneous cholecystostomy tube 08/20/2022 - tolerated well - continues to drain  appropriately -Continue supportive care for nausea vomiting and pain -currently well controlled -Continue broad-spectrum antibiotics de-escalate from zosyn to augmentin to complete 7 day course -Labs downtrending appropriately  Type 2 diabetes, insulin-dependent Renal/carb modified diet SSI/type II ischemic protocol ongoing   Chronic systolic and diastolic CHF -not in acute exacerbation Echocardiogram 8/23 with LVEF 35-40% with LV global hypokinesis Appears euvolemic -monitor closely in the setting of IV fluids as above   CAD with prior stent Hold aspirin and statin for now   ESRD on HD, mwf Primary dialysis catheter present since 8/15 Nephrology consulted for inpatient hemodialysis management   Hypertension, essential Well-controlled on current regimen and dialysis including amlodipine carvedilol clonidine hydralazine isosorbide   Chronic opioid dependence/chronic back pain Continue pain management oral and IV as ordered Low threshold to de-escalate IV narcotics given history   Paroxysmal atrial fibrillation Recent diagnosis prior to hospitalization - not pursuing chronic anticoagulation therapy given chronic anemia/bleeding risk CHA2DS2-VASc of 6   Chronic anemia of chronic renal disease Hemoglobin 9.1, transfuse for hemoglobin less than 8/symptomatic No overt bleeding noted   Dyslipidemia Hold statin given transaminitis due to above   History of gout Not in acute exacerbation Continue allopurinol   GERD Continue PPI   Obesity BMI 31.14    DVT prophylaxis: SCDs Code Status: Full Family Communication: None present  Consults called:General surgery, IR, Nephrology  Status is: Inpatient  Dispo: The patient is from: SNF              Anticipated d/c is to: SNF (prefers to go to different SNF than admitted from)              Anticipated d/c date is: 24-48 hours              Patient currently IS medically stable for discharge  Procedures:  Percutaneous  cholecystostomy 08/20/2022  Antimicrobials:  Zosyn, ongoing  Subjective: No acute issues or events overnight, abdomen pain improving, tolerated biliary drain placement well.  Requesting to advance diet which is certainly reasonable.  Objective: Vitals:   08/21/22 1855 08/21/22 2007 08/22/22 0406 08/22/22 1603  BP:  (!) 131/111 (!) 134/119 (!) 130/54  Pulse:  (!) 52 81 71  Resp:  20 20 17   Temp:  98.8 F (37.1 C) 98 F (36.7 C) 100.1 F (37.8 C)  TempSrc:  Oral Oral Oral  SpO2:  96% 95% 97%  Weight: 100.3 kg     Height:        Intake/Output Summary (Last 24 hours) at 08/22/2022 1628 Last data filed at 08/22/2022 1517 Gross per 24 hour  Intake 270.32 ml  Output 1140 ml  Net -869.68 ml    Filed Weights   08/19/22 1218 08/21/22 1415 08/21/22 1855  Weight: 110 kg 99.3 kg 100.3 kg    Examination:  General:  Pleasantly resting in bed, No acute distress. HEENT:  Normocephalic atraumatic.  Sclerae nonicteric, noninjected.  Extraocular movements intact bilaterally. Neck:  Without mass or deformity.  Trachea is midline. Lungs:  Clear to auscultate bilaterally without rhonchi, wheeze, or rales. Heart:  Regular rate and rhythm.  Without murmurs, rubs, or gallops. Abdomen:  Soft, nontender, nondistended.  Right flank biliary drain clean dry intact, dark brown clear fluid without serosanguineous fluid noted in the bag Extremities: Without cyanosis, clubbing, edema, or obvious deformity. Vascular:  Dorsalis pedis and posterior tibial pulses palpable bilaterally. Skin:  Warm and dry, no erythema, no ulcerations.  Data Reviewed: I have personally reviewed following labs and imaging studies  CBC: Recent Labs  Lab 08/19/22 0732 08/20/22 0114 08/21/22 0303 08/22/22 0118  WBC 24.9* 19.4* 12.3* 11.4*  NEUTROABS 20.9*  --   --   --   HGB 9.1* 9.3* 8.2* 8.1*  HCT 28.2* 27.6* 25.3* 25.0*  MCV 98.6 94.8 95.8 95.8  PLT 215 186 191 865    Basic Metabolic Panel: Recent Labs  Lab  08/19/22 0732 08/20/22 0114 08/21/22 0303 08/21/22 1336 08/22/22 0118  NA 135 137 140 140 137  K 4.3 4.1 3.8 4.0 3.4*  CL 92* 95* 97* 95* 95*  CO2 27 25 26 26 25   GLUCOSE 133* 112* 85 98 83  BUN 63* 31* 50* 58* 29*  CREATININE 4.90* 3.35* 5.49* 6.50* 3.90*  CALCIUM 9.2 9.0 9.2 9.6 9.1  MG  --  1.9  --   --   --   PHOS  --   --   --  4.6  --     GFR: Estimated Creatinine Clearance: 20.1 mL/min (A) (by C-G formula based on SCr of 3.9 mg/dL (H)). Liver Function Tests: Recent Labs  Lab 08/19/22 0732 08/20/22 0114 08/21/22 0303 08/21/22 1336 08/22/22 0118  AST 711* 402* 257*  --  168*  ALT 273* 252* 195*  --  148*  ALKPHOS 613* 673* 599*  --  531*  BILITOT 3.3* 5.1* 7.3*  --  6.8*  PROT 8.2* 7.0 6.8  --  6.7  ALBUMIN 3.3* 2.8* 2.3* 2.3* 2.2*    Recent Labs  Lab 08/19/22 0928 08/20/22 0114  LIPASE 925* 136*    No results for input(s): "AMMONIA" in the last 168 hours. Coagulation Profile: Recent Labs  Lab 08/19/22 0732 08/20/22 0637  INR 1.6* 1.5*    Cardiac Enzymes: No results for input(s): "CKTOTAL", "CKMB", "CKMBINDEX", "TROPONINI" in the last 168 hours.  BNP (last 3 results) No results for input(s): "PROBNP" in the last 8760 hours. HbA1C: No results for input(s): "HGBA1C" in the last 72 hours. CBG: Recent Labs  Lab 08/20/22 0017  GLUCAP 121*    Lipid Profile: No results for input(s): "CHOL", "HDL", "LDLCALC", "TRIG", "CHOLHDL", "LDLDIRECT" in the last 72 hours. Thyroid Function Tests: No results for input(s): "TSH", "T4TOTAL", "FREET4", "T3FREE", "THYROIDAB" in the last 72 hours. Anemia Panel: No results for input(s): "VITAMINB12", "FOLATE", "FERRITIN", "TIBC", "IRON", "RETICCTPCT" in the last 72 hours. Sepsis Labs: Recent Labs  Lab 08/19/22 0732 08/19/22 0928  LATICACIDVEN 1.6 1.3     Recent Results (from the past 240 hour(s))  Blood Culture (routine x 2)     Status: None (Preliminary result)   Collection Time: 08/19/22  7:32 AM    Specimen: Left Antecubital; Blood  Result Value Ref Range Status   Specimen Description   Final    LEFT ANTECUBITAL BOTTLES DRAWN AEROBIC AND ANAEROBIC   Special Requests Blood Culture adequate volume  Final   Culture   Final    NO GROWTH 3 DAYS Performed at [facility], [address] [place], [place] [address]    Report Status PENDING  Incomplete  Blood Culture (routine x 2)     Status: None (Preliminary result)   Collection Time: 08/19/22  7:40 AM   Specimen: BLOOD RIGHT FOREARM  Result Value Ref Range Status   Specimen Description   Final    BLOOD RIGHT FOREARM BOTTLES DRAWN AEROBIC AND ANAEROBIC   Special Requests   Final    Blood Culture results may not be optimal due to an inadequate volume of blood received in culture bottles   Culture   Final    NO GROWTH 3 DAYS Performed at [facility], [address] [place], [place] [address]    Report Status PENDING  Incomplete  Aerobic/Anaerobic Culture w Gram Stain (surgical/deep wound)     Status: None (Preliminary result)   Collection Time: 08/20/22  1:13 PM   Specimen: Gallbladder; Abscess  Result Value Ref Range Status   Specimen Description GALL BLADDER  Final   Special Requests NONE  Final   Gram Stain   Final    MODERATE YEAST WITH PSEUDOHYPHAE RARE WBC PRESENT, PREDOMINANTLY PMN Performed at [facility], [address] [place], [place] [address]    Culture   Final    MODERATE CANDIDA ALBICANS NO ANAEROBES ISOLATED; CULTURE IN PROGRESS FOR 5 DAYS    Report Status PENDING  Incomplete     Radiology Studies: No results found.  Scheduled Meds:  allopurinol  100 mg Oral Daily   amLODipine  10 mg Oral QPM   amoxicillin-clavulanate  1 tablet Oral BID   [START ON 08/25/2022] calcitRIOL  0.25 mcg Oral Q Sun   carvedilol  6.25 mg Oral BID WC   Chlorhexidine Gluconate Cloth  6 each Topical Q0600   cloNIDine  0.1 mg Oral TID   cycloSPORINE  1 drop Both Eyes BID   hydrALAZINE  100 mg Oral TID   isosorbide  mononitrate  60 mg Oral BID   sodium chloride flush  3 mL Intravenous Q12H   Continuous Infusions:  sodium chloride 10 mL/hr at 08/20/22 0003   albumin human 60 mL/hr at 08/20/22 0003     LOS: 3 days   Time spent: 73mn  Anthea Udovich C Fawnda Vitullo, DO [facility]  If 7PM-7AM, please contact night-coverage [email or web address]  08/22/2022, 4:28 PM

## 2022-08-22 NOTE — NC FL2 (Signed)
[place] LEVEL OF CARE SCREENING TOOL     IDENTIFICATION  Patient Name: [name] Birthdate: 20-Jan-1946 Sex: male Admission Date (Current Location): 08/19/2022  [facility] and [place] Number:  Herbalist and Address:  [facility], [place] [address], [place], [place] [address]      Provider Number: [id number]  Attending Physician Name and Address:  Little Ishikawa, MD  Relative Name and Phone Number:  Aayansh, [facility])   [phone]    Current Level of Care: Hospital Recommended Level of Care: [place] Prior Approval Number:    Date Approved/Denied:   PASRR Number: [id number]  Discharge Plan: SNF    Current Diagnoses: Patient Active Problem List   Diagnosis Date Noted   Acute cholecystitis 08/19/2022   ESRD (end stage renal disease) ([place]) 08/07/2022   HFrEF (heart failure with reduced ejection fraction) ([place])    (HFpEF) heart failure with preserved ejection fraction ([place]) 07/29/2022   Hypokalemia 07/29/2022   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   Acute respiratory failure with hypoxia ([place])    B12 deficiency 07/18/2022   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/2022   Diabetic neuropathy ([place]) 05/10/2022   Elevated troponin 05/10/2022   Type 2 diabetes mellitus with hyperglycemia ([place]) 05/10/2022   Other intervertebral disc degeneration, lumbar region 02/06/2022   Chronic gout, unspecified, 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ophi) 02/06/2022   Body mass index (BMI) 35.0-35.9, adult 02/06/2022   Obesity 02/06/2022   Primary generalized (osteo)arthritis 02/06/2022   Chest pain 02/06/2022   Lumbago with sciatica, right side 08/28/2021   Weakness of both lower extremities 08/28/2021   Body mass index (BMI) 33.0-33.9, adult 08/28/2021   Constipation 02/15/2021   Abdominal pain 01/02/2021   Diabetes mellitus ([place]) 11/09/2020   Hypertensive disorder 11/09/2020   Hypercholesterolemia 11/09/2020   H/O adenomatous polyp of  colon 05/05/2020   Iron deficiency anemia 05/05/2020   Chronic kidney disease, stage IV (severe) ([place]) 12/14/2019   Acute gouty arthritis 10/25/2019   Leukocytosis 04/02/2019   Cellulitis of right hand 04/01/2019   Osteoarthritis of ankle and foot 02/08/2019   CHF (congestive heart failure) ([place]) 07/05/2015   Acute congestive heart failure ([place]) 07/04/2015   Acute on chronic diastolic congestive heart failure ([place]) 07/04/2015   Mixed hyperlipidemia 07/04/2015   Anxiety state 07/04/2015   GERD (gastroesophageal reflux disease) 07/04/2015   Heart block 07/04/2015   Gastroesophageal reflux disease 62/02/5596   Diastolic CHF, chronic (HCC) 03/11/2015   Chronic diastolic heart failure ([place]) 03/11/2015   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   Knee stiffness 01/13/2014   Gout 12/11/2013   Expected blood loss anemia 11/17/2013   S/P right TKA 11/15/2013   Anemia, normocytic normochromic 01/12/2013   Acute kidney injury superimposed on chronic kidney disease ([place]) 12/10/2012   Diabetes mellitus type 2 in obese ([place]) 12/10/2012   CAD S/P percutaneous coronary angioplasty 11/24/2012   Overweight(278.02) 11/17/2012   Chronic kidney disease, stage 3, cardiorenal syndrome 08/23/2011   Chronic kidney disease, stage 3 unspecified ([place]) 08/23/2011   Essential hypertension     Orientation RESPIRATION BLADDER Height & Weight     Self, Situation, Place, Time  Normal Incontinent, External catheter Weight: 221 lb 1.9 oz (100.3 kg) Height:  6' 2"  (188 cm)  BEHAVIORAL SYMPTOMS/MOOD NEUROLOGICAL BOWEL NUTRITION STATUS      Continent Diet (See DC Summary)  AMBULATORY STATUS COMMUNICATION OF NEEDS Skin   Limited Assist Verbally Normal  Personal Care Assistance Level of Assistance  Bathing, Feeding, Dressing Bathing Assistance: Limited assistance Feeding assistance: Independent Dressing Assistance: Limited assistance     Functional Limitations Info  Sight, Hearing, Speech Sight  Info: Adequate Hearing Info: Adequate Speech Info: Adequate    SPECIAL CARE FACTORS FREQUENCY        PT Frequency: 5x a week OT Frequency: 3x a week            Contractures Contractures Info: Not present    Additional Factors Info  Code Status, Allergies Code Status Info: Full Allergies Info: Metformin, Niacin           Current Medications (08/22/2022):  This is the current hospital active medication list Current Facility-Administered Medications  Medication Dose Route Frequency Provider Last Rate Last Admin   0.9 %  sodium chloride infusion  250 mL Intravenous PRN Manuella Ghazi, Pratik D, DO 10 mL/hr at 08/20/22 0003 Infusion Verify at 08/20/22 0003   albumin human 25 % solution 25 g  25 g Intravenous Q1H PRN Donato Heinz, MD 60 mL/hr at 08/20/22 0003 Infusion Verify at 08/20/22 0003   allopurinol (ZYLOPRIM) tablet 100 mg  100 mg Oral Daily Heath Lark D, DO   100 mg at 08/22/22 1010   amLODipine (NORVASC) tablet 10 mg  10 mg Oral QPM Manuella Ghazi, Pratik D, DO       [START ON 08/25/2022] calcitRIOL (ROCALTROL) capsule 0.25 mcg  0.25 mcg Oral Q Sun Shah, Pratik D, DO       carvedilol (COREG) tablet 6.25 mg  6.25 mg Oral BID WC Manuella Ghazi, Pratik D, DO   6.25 mg at 08/22/22 7116   Chlorhexidine Gluconate Cloth 2 % PADS 6 each  6 each Topical Q0600 Heath Lark D, DO   6 each at 08/22/22 0518   cloNIDine (CATAPRES) tablet 0.1 mg  0.1 mg Oral TID Heath Lark D, DO   0.1 mg at 08/22/22 1010   cycloSPORINE (RESTASIS) 0.05 % ophthalmic emulsion 1 drop  1 drop Both Eyes BID Manuella Ghazi, Pratik D, DO   1 drop at 08/22/22 1010   hydrALAZINE (APRESOLINE) tablet 100 mg  100 mg Oral TID Heath Lark D, DO   100 mg at 08/22/22 1010   HYDROmorphone (DILAUDID) injection 0.5 mg  0.5 mg Intravenous Q8H PRN Little Ishikawa, MD       isosorbide mononitrate (IMDUR) 24 hr tablet 60 mg  60 mg Oral BID Manuella Ghazi, Pratik D, DO   60 mg at 08/22/22 1159   nitroGLYCERIN (NITROSTAT) SL tablet 0.4 mg  0.4 mg Sublingual Q5 min  PRN Manuella Ghazi, Pratik D, DO       ondansetron (ZOFRAN) tablet 4 mg  4 mg Oral Q6H PRN Manuella Ghazi, Pratik D, DO       Or   ondansetron (ZOFRAN) injection 4 mg  4 mg Intravenous Q6H PRN Manuella Ghazi, Pratik D, DO   4 mg at 08/22/22 1004   Oral care mouth rinse  15 mL Mouth Rinse PRN Manuella Ghazi, Pratik D, DO       piperacillin-tazobactam (ZOSYN) IVPB 2.25 g  2.25 g Intravenous Q8H Shah, Pratik D, DO 100 mL/hr at 08/22/22 0518 2.25 g at 08/22/22 0518   polyvinyl alcohol (LIQUIFILM TEARS) 1.4 % ophthalmic solution 1 drop  1 drop Both Eyes Daily PRN Manuella Ghazi, Pratik D, DO       sodium chloride flush (NS) 0.9 % injection 3 mL  3 mL Intravenous Q12H Shah, Pratik D, DO   3 mL at 08/22/22 1013  sodium chloride flush (NS) 0.9 % injection 3 mL  3 mL Intravenous PRN Manuella Ghazi, Pratik D, DO   3 mL at 08/21/22 1249     Discharge Medications: Please see discharge summary for a list of discharge medications.  Relevant Imaging Results:  Relevant Lab Results:   Additional Information SSN: [id number], [place]

## 2022-08-23 ENCOUNTER — Encounter (HOSPITAL_COMMUNITY): Payer: Self-pay | Admitting: Hematology

## 2022-08-23 ENCOUNTER — Other Ambulatory Visit (HOSPITAL_COMMUNITY): Payer: Self-pay

## 2022-08-23 DIAGNOSIS — D649 Anemia, unspecified: Secondary | ICD-10-CM | POA: Diagnosis not present

## 2022-08-23 DIAGNOSIS — K810 Acute cholecystitis: Secondary | ICD-10-CM | POA: Diagnosis not present

## 2022-08-23 DIAGNOSIS — L89132 Pressure ulcer of right lower back, stage 2: Secondary | ICD-10-CM | POA: Diagnosis not present

## 2022-08-23 DIAGNOSIS — N186 End stage renal disease: Secondary | ICD-10-CM | POA: Diagnosis not present

## 2022-08-23 DIAGNOSIS — Z934 Other artificial openings of gastrointestinal tract status: Secondary | ICD-10-CM | POA: Diagnosis not present

## 2022-08-23 DIAGNOSIS — I5032 Chronic diastolic (congestive) heart failure: Secondary | ICD-10-CM | POA: Diagnosis not present

## 2022-08-23 DIAGNOSIS — I2510 Atherosclerotic heart disease of native coronary artery without angina pectoris: Secondary | ICD-10-CM | POA: Diagnosis not present

## 2022-08-23 DIAGNOSIS — R262 Difficulty in walking, not elsewhere classified: Secondary | ICD-10-CM | POA: Diagnosis not present

## 2022-08-23 DIAGNOSIS — M545 Low back pain, unspecified: Secondary | ICD-10-CM | POA: Diagnosis not present

## 2022-08-23 DIAGNOSIS — N250 Renal osteodystrophy: Secondary | ICD-10-CM | POA: Diagnosis not present

## 2022-08-23 DIAGNOSIS — N2581 Secondary hyperparathyroidism of renal origin: Secondary | ICD-10-CM | POA: Diagnosis not present

## 2022-08-23 DIAGNOSIS — E1169 Type 2 diabetes mellitus with other specified complication: Secondary | ICD-10-CM | POA: Diagnosis not present

## 2022-08-23 DIAGNOSIS — M5136 Other intervertebral disc degeneration, lumbar region: Secondary | ICD-10-CM | POA: Diagnosis not present

## 2022-08-23 DIAGNOSIS — G8929 Other chronic pain: Secondary | ICD-10-CM | POA: Diagnosis not present

## 2022-08-23 DIAGNOSIS — D638 Anemia in other chronic diseases classified elsewhere: Secondary | ICD-10-CM | POA: Diagnosis not present

## 2022-08-23 DIAGNOSIS — Z23 Encounter for immunization: Secondary | ICD-10-CM | POA: Diagnosis not present

## 2022-08-23 DIAGNOSIS — Z992 Dependence on renal dialysis: Secondary | ICD-10-CM | POA: Diagnosis not present

## 2022-08-23 DIAGNOSIS — D689 Coagulation defect, unspecified: Secondary | ICD-10-CM | POA: Diagnosis not present

## 2022-08-23 DIAGNOSIS — Z9861 Coronary angioplasty status: Secondary | ICD-10-CM | POA: Diagnosis not present

## 2022-08-23 DIAGNOSIS — R279 Unspecified lack of coordination: Secondary | ICD-10-CM | POA: Diagnosis not present

## 2022-08-23 DIAGNOSIS — I5042 Chronic combined systolic (congestive) and diastolic (congestive) heart failure: Secondary | ICD-10-CM | POA: Diagnosis not present

## 2022-08-23 DIAGNOSIS — R52 Pain, unspecified: Secondary | ICD-10-CM | POA: Diagnosis not present

## 2022-08-23 DIAGNOSIS — D631 Anemia in chronic kidney disease: Secondary | ICD-10-CM | POA: Diagnosis not present

## 2022-08-23 DIAGNOSIS — I5030 Unspecified diastolic (congestive) heart failure: Secondary | ICD-10-CM | POA: Diagnosis not present

## 2022-08-23 DIAGNOSIS — Z7401 Bed confinement status: Secondary | ICD-10-CM | POA: Diagnosis not present

## 2022-08-23 DIAGNOSIS — M549 Dorsalgia, unspecified: Secondary | ICD-10-CM | POA: Diagnosis not present

## 2022-08-23 DIAGNOSIS — M6281 Muscle weakness (generalized): Secondary | ICD-10-CM | POA: Diagnosis not present

## 2022-08-23 DIAGNOSIS — M2550 Pain in unspecified joint: Secondary | ICD-10-CM | POA: Diagnosis not present

## 2022-08-23 DIAGNOSIS — I10 Essential (primary) hypertension: Secondary | ICD-10-CM | POA: Diagnosis not present

## 2022-08-23 DIAGNOSIS — E119 Type 2 diabetes mellitus without complications: Secondary | ICD-10-CM | POA: Diagnosis not present

## 2022-08-23 DIAGNOSIS — R5381 Other malaise: Secondary | ICD-10-CM | POA: Diagnosis not present

## 2022-08-23 DIAGNOSIS — R109 Unspecified abdominal pain: Secondary | ICD-10-CM | POA: Diagnosis not present

## 2022-08-23 DIAGNOSIS — E785 Hyperlipidemia, unspecified: Secondary | ICD-10-CM | POA: Diagnosis not present

## 2022-08-23 DIAGNOSIS — L89152 Pressure ulcer of sacral region, stage 2: Secondary | ICD-10-CM | POA: Diagnosis not present

## 2022-08-23 DIAGNOSIS — Z794 Long term (current) use of insulin: Secondary | ICD-10-CM | POA: Diagnosis not present

## 2022-08-23 DIAGNOSIS — I132 Hypertensive heart and chronic kidney disease with heart failure and with stage 5 chronic kidney disease, or end stage renal disease: Secondary | ICD-10-CM | POA: Diagnosis not present

## 2022-08-23 DIAGNOSIS — E1122 Type 2 diabetes mellitus with diabetic chronic kidney disease: Secondary | ICD-10-CM | POA: Diagnosis not present

## 2022-08-23 DIAGNOSIS — M1A9XX1 Chronic gout, unspecified, 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ophi): Secondary | ICD-10-CM | POA: Diagnosis not present

## 2022-08-23 LAB — COMPREHENSIVE METABOLIC PANEL
ALT: 126 U/L — ABNORMAL HIGH (ref 0–44)
AST: 151 U/L — ABNORMAL HIGH (ref 15–41)
Albumin: 2.2 g/dL — ABNORMAL LOW (ref 3.5–5.0)
Alkaline Phosphatase: 542 U/L — ABNORMAL HIGH (ref 38–126)
Anion gap: 17 — ABNORMAL HIGH (ref 5–15)
BUN: 59 mg/dL — ABNORMAL HIGH (ref 8–23)
CO2: 26 mmol/L (ref 22–32)
Calcium: 9.4 mg/dL (ref 8.9–10.3)
Chloride: 93 mmol/L — ABNORMAL LOW (ref 98–111)
Creatinine, Ser: 5.9 mg/dL — ABNORMAL HIGH (ref 0.61–1.24)
GFR, Estimated: 9 mL/min — ABNORMAL LOW (ref >60)
Glucose, Bld: 134 mg/dL — ABNORMAL HIGH (ref 70–99)
Potassium: 3.5 mmol/L (ref 3.5–5.1)
Sodium: 136 mmol/L (ref 135–145)
Total Bilirubin: 3.5 mg/dL — ABNORMAL HIGH (ref 0.3–1.2)
Total Protein: 7.2 g/dL (ref 6.5–8.1)

## 2022-08-23 LAB — CBC
HCT: 24.4 % — ABNORMAL LOW (ref 39.0–52.0)
Hemoglobin: 8.5 g/dL — ABNORMAL LOW (ref 13.0–17.0)
MCH: 31.6 pg (ref 26.0–34.0)
MCHC: 34.8 g/dL (ref 30.0–36.0)
MCV: 90.7 fL (ref 80.0–100.0)
Platelets: 214 10*3/uL (ref 150–400)
RBC: 2.69 MIL/uL — ABNORMAL LOW (ref 4.22–5.81)
RDW: 16.8 % — ABNORMAL HIGH (ref 11.5–15.5)
WBC: 10.5 10*3/uL (ref 4.0–10.5)
nRBC: 0.2 % (ref 0.0–0.2)

## 2022-08-23 LAB — GLUCOSE, CAPILLARY: Glucose-Capillary: 123 mg/dL — ABNORMAL HIGH (ref 70–99)

## 2022-08-23 MED ORDER — HYDRALAZINE HCL 100 MG PO TABS
100.0000 mg | ORAL_TABLET | Freq: Three times a day (TID) | ORAL | 0 refills | Status: DC
  Filled 2022-08-23: qty 90, 30d supply, fill #0

## 2022-08-23 MED ORDER — HEPARIN SODIUM (PORCINE) 1000 UNIT/ML IJ SOLN
INTRAMUSCULAR | Status: AC
  Administered 2022-08-23: 1000 [IU]
  Filled 2022-08-23: qty 4

## 2022-08-23 MED ORDER — AMOXICILLIN-POT CLAVULANATE 500-125 MG PO TABS
1.0000 | ORAL_TABLET | Freq: Two times a day (BID) | ORAL | 0 refills | Status: DC
  Filled 2022-08-23: qty 7, 4d supply, fill #0

## 2022-08-23 MED ORDER — SODIUM CHLORIDE 0.9% FLUSH: 5.0000 mL | Freq: Three times a day (TID) | INTRAVENOUS | Status: DC

## 2022-08-23 NOTE — Progress Notes (Signed)
[facility] Progress Note   76 y.o. male CASHD, HTN, DM, GERD, chronic combined systolic and diastolic CHF (EF 10-21%) and new ESRD on HD MWF p/w abdominal pain associated with N/V.  In the ED, VSS, temp 100.3 and was ill-appearing.  Labs notable for AST 711, ALT 273, Alk phos 613, T bili 3.3, WBC 24.9, Hgb 9.1, INR 1.6.  CT scan of abdomen and pelvis consistent with acute cholecystitis.  He was admitted for IV antibiotics with plans to transfer to [facility] for IR to place cholecystostomy tube.     Dialysis Orders: Center: [facility]  on MWF . EDW 103.5kg HD Bath 2K/2.5Ca  Time 4 hours Heparin none. Access RIJ TDC BFR 400 DFR 800     Venofer  100 mg IVP every HD through 09/09/22    Assessment/ Plan:    Acute cholecystitis - too high risk for surgery and plan for cholecystostomy tube and possible MRCP to r/o stones.  U/S neg for choledocholithiasis. Currently started on IV antibiotics.  Avoid nephrotoxic medications including NSAIDs and iodinated intravenous contrast exposure unless the latter is absolutely indicated.   Preferred narcotic agents for pain control are hydromorphone, fentanyl, and methadone. Morphine should not be used.  Avoid Baclofen and avoid oral sodium phosphate and magnesium citrate based laxatives / bowel preps.  Continue strict Input and Output monitoring. Will monitor the patient closely with you and intervene or adjust therapy as indicated by changes in clinical status/labs   ESRD -  Following episode of AKI/CKD stage IV and has been dialysis dependent since 08/06/22.  Plan for HD on MWF schedule.  Tolerated HD 8/28 @ [facility] with 451m HD 8/30 with 800 mL UF.         Weights are all over the place; need standing weight.    Hypertension/volume  -  stable but Bp's on low side.  Anemia  - Continue with ESA and transfuse for Hgb <7 and IV iron.  Metabolic bone disease -  meds on hold as pt is npo  Nutrition - Low albumin. Add protein supplements.   Additional  Objective Labs: Basic Metabolic Panel: Recent Labs  Lab 08/21/22 1336 08/22/22 0118 08/23/22 0217  NA 140 137 136  K 4.0 3.4* 3.5  CL 95* 95* 93*  CO2 _0 GLUCOSE 98 83 134*  BUN 58* 29* 59*  CREATININE 6.50* 3.90* 5.90*  CALCIUM 9.6 9.1 9.4  PHOS 4.6  --   --    Liver Function Tests: Recent Labs  Lab 08/21/22 0303 08/21/22 1336 08/22/22 0118 08/23/22 0217  AST 257*  --  168* 151*  ALT 195*  --  148* 126*  ALKPHOS 599*  --  531* 542*  BILITOT 7.3*  --  6.8* 3.5*  PROT 6.8  --  6.7 7.2  ALBUMIN 2.3* 2.3* 2.2* 2.2*   Recent Labs  Lab 08/19/22 0928 08/20/22 0114  LIPASE 925* 136*   CBC: Recent Labs  Lab 08/19/22 0732 08/20/22 0114 08/21/22 0303 08/22/22 0118 08/23/22 0217  WBC 24.9* 19.4* 12.3* 11.4* 10.5  NEUTROABS 20.9*  --   --   --   --   HGB 9.1* 9.3* 8.2* 8.1* 8.5*  HCT 28.2* 27.6* 25.3* 25.0* 24.4*  MCV 98.6 94.8 95.8 95.8 90.7  PLT 215 186 191 183 214   Blood Culture    Component Value Date/Time   SDES GALL BLADDER 08/20/2022 1313   SPECREQUEST NONE 08/20/2022 1313   CULT  08/20/2022 1313  MODERATE CANDIDA ALBICANS NO ANAEROBES ISOLATED; CULTURE IN PROGRESS FOR 5 DAYS    REPTSTATUS PENDING 08/20/2022 1313    Cardiac Enzymes: No results for input(s): "CKTOTAL", "CKMB", "CKMBINDEX", "TROPONINI" in the last 168 hours. CBG: Recent Labs  Lab 08/20/22 0017  GLUCAP 121*   Iron Studies: No results for input(s): "IRON", "TIBC", "TRANSFERRIN", "FERRITIN" in the last 72 hours. _0 @ Studies/Results: No results found. Medications:  sodium chloride 10 mL/hr at 08/20/22 0003   albumin human 60 mL/hr at 08/20/22 0003    allopurinol  100 mg Oral Daily   amLODipine  10 mg Oral QPM   amoxicillin-clavulanate  1 tablet Oral BID   [START ON 08/25/2022] calcitRIOL  0.25 mcg Oral Q Sun   carvedilol  6.25 mg Oral BID WC   Chlorhexidine Gluconate Cloth  6 each Topical Q0600   cloNIDine  0.1 mg Oral TID   cycloSPORINE  1 drop Both Eyes  BID   hydrALAZINE  100 mg Oral TID   isosorbide mononitrate  60 mg Oral BID   sodium chloride flush  3 mL Intravenous Q12H   Physical Exam General: Chronically ill appearing male in NAD Heart: S1,S2 RRR SR on monitor.  Lungs: CTAB A/P Abdomen: Tender RUQ, guarding. Hypoactive BS. Cholesystostomy tube draining.  Extremities: 1+ pedal edema.  Dialysis Access: RIJ TDC drsg intact.   Natalin Bible H. Ali Mclaurin NP-C 08/23/2022, 10:07 AM  [employer] [phone]

## 2022-08-23 NOTE — Progress Notes (Addendum)
D/C order noted. Contacted [facility] ([facility]) and spoke to [place]. Clinic advised pt will d/c to [facility] today and will resume care on Monday. Pt arrives at 11:30 for 11:50 chair time on MWF. This info was provided to CSW yesterday to provide to snf.   Melven Sartorius Renal Navigator [phone]

## 2022-08-23 NOTE — Progress Notes (Signed)
Mobility Specialist - Progress Note   08/23/22 1427  Mobility  Activity Dangled on edge of bed  Level of Assistance Moderate assist, patient does 50-74%  Assistive Device None  Distance Ambulated (ft) 0 ft  Activity Response Tolerated well  $Mobility charge 1 Mobility    Pt received in bed and agreeable to mobility. Asked to sit on EOB to eat his lunch. ModA required to help pt move legs as well as trunk. Returned to supine after eating. Left in bed w/ call bell in reach and all needs met.   Paulla Dolly Mobility Specialist

## 2022-08-23 NOTE — Progress Notes (Signed)
Discharge report given to [place], RN at [facility].

## 2022-08-23 NOTE — Discharge Summary (Signed)
Physician Discharge Summary  [name] UUV:[id number] DOB: [birth year] DOA: 08/19/2022  PCP: Redmond School, MD  Admit date: 08/19/2022 Discharge date: 08/23/2022  Admitted From: SNF Disposition: SNF  Recommendations for Outpatient Follow-up:  Follow up with PCP in 1-2 weeks Follow-up with general surgery as scheduled for further evaluation of gallbladder removal Continue dialysis as scheduled  Discharge Condition: Stable CODE STATUS: Full Diet recommendation: Renal carb modified diet  Brief/Interim Summary: [name] is a 76 y.o. male with medical history significant for ESRD on HD Monday Wednesday Friday -recently initiated, CAD, chronic diastolic/systolic CHF, IDDM2, hypertension, dyslipidemia, GERD, gout, and obesity who was recently discharged on 8/19 after admission for chest pain with volume overload.  He was started on hemodialysis after dialysis catheter placement during that admission and was discharged to SNF.     Presents with worsening epigastric abdominal pain noted to be right upper quadrant, imaging and exam concerning for cholecystitis, general surgery recommending percutaneous cholecystostomy tube given high risk for surgery.  Transferred to [facility] for further evaluation, interventional radiology for placement of cholecystostomy tube, no complications from procedure, tube draining well, symptoms resolving.  Continues dialysis per nephrology schedule.   Patient currently medically stable for discharge - on PO antibiotics; cholecystostomy draining appropriately.  Outpatient follow-up with general surgery per their schedule in 1 to 2 weeks per previous documentation.     Assessment & Plan:     Acute cholecystitis, resolving   ESRD (end stage renal disease) ([place])   CAD S/P percutaneous coronary angioplasty   GERD (gastroesophageal reflux disease)   Diabetes mellitus type 2 in obese [facility]), well controlled   Anemia, normocytic normochromic   Essential  hypertension   Mixed hyperlipidemia   Chronic diastolic heart failure (HCC) EF 35 to 40%, not in acute exacerbation   Chronic gout, unspecified, 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ophi)   Body mass index (BMI) 33.0-33.9, adult   Acute cholecystitis, POA, resolving Sepsis POA, resolved Concurrently elevated LFT/Bilirubin, POA - Appreciate general surgery recommendations: non-operative approach, outpatient follow-up in their office September as scheduled - Interventional radiology consulted, status post percutaneous cholecystostomy tube 08/20/2022 - tolerated well - continues to drain appropriately -Continue supportive care for nausea vomiting and pain -currently well controlled -Continue augmentin to complete 7 day course  Type 2 diabetes, insulin-dependent, well controlled Renal/carb modified diet Resume home medications   Chronic systolic and diastolic CHF -not in acute exacerbation Echocardiogram 8/23 with LVEF 35-40% with LV global hypokinesis Appears euvolemic   CAD with prior stent Resume aspirin statin   ESRD on HD, mwf Primary dialysis catheter present since 8/15 Continue dialysis per nephrology   Hypertension, essential Continue home amlodipine carvedilol clonidine hydralazine isosorbide   Chronic opioid dependence/chronic back pain Continue pain management oral and IV as ordered Low threshold to de-escalate IV narcotics given history   Paroxysmal atrial fibrillation Recent diagnosis prior to hospitalization - not pursuing chronic anticoagulation therapy given chronic anemia/bleeding risk CHA2DS2-VASc of 6   Chronic anemia of chronic renal disease Hemoglobin stable No overt bleeding noted   Dyslipidemia Resume home statin   History of gout Not in acute exacerbation Continue allopurinol   GERD Continue PPI   Obesity BMI 31.14  Discharge Diagnoses:  Principal Problem:   Acute cholecystitis Active Problems:   ESRD (end stage renal disease) ([place])   CAD S/P percutaneous  coronary angioplasty   GERD (gastroesophageal reflux disease)   Diabetes mellitus type 2 in obese (HCC)   Anemia, normocytic normochromic   Essential hypertension  Mixed hyperlipidemia   Chronic diastolic heart failure (HCC)   Chronic gout, unspecified, 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ophi)   Body mass index (BMI) 33.0-33.9, adult    Discharge Instructions   Allergies as of 08/23/2022       Reactions   Metformin Nausea And Vomiting   Niacin    Niacin And Related Other (See Comments)   Reaction: flushing and burning side effects even with extended release        Medication List     TAKE these medications    acarbose 50 MG tablet Commonly known as: PRECOSE Take 50 mg by mouth in the morning, at noon, and at bedtime.   acetaminophen 500 MG tablet Commonly known as: TYLENOL Take 2 tablets (1,000 mg total) by mouth every 6 (six) hours.   allopurinol 100 MG tablet Commonly known as: ZYLOPRIM Take 100 mg by mouth daily.   amLODipine 10 MG tablet Commonly known as: NORVASC Take 10 mg by mouth every evening.   amoxicillin-clavulanate 500-125 MG tablet Commonly known as: AUGMENTIN Take 1 tablet (500 mg total) by mouth 2 (two) times daily.   aspirin EC 81 MG tablet Take 1 tablet (81 mg total) by mouth daily.   calcitRIOL 0.25 MCG capsule Commonly known as: ROCALTROL Take 0.25 mcg by mouth every Sunday.   carboxymethylcellulose 0.5 % Soln Commonly known as: REFRESH PLUS Place 1 drop into both eyes daily as needed (dry eyes).   carvedilol 6.25 MG tablet Commonly known as: COREG Take 6.25 mg by mouth 2 (two) times daily with a meal.   cholecalciferol 1000 units tablet Commonly known as: VITAMIN D Take 1,000 Units daily by mouth.   cloNIDine 0.1 MG tablet Commonly known as: CATAPRES Take 0.1 mg by mouth 3 (three) times daily.   epoetin alfa 3000 UNIT/ML injection Commonly known as: EPOGEN Inject into the skin.   hydrALAZINE 100 MG tablet Commonly known as:  APRESOLINE Take 1 tablet (100 mg total) by mouth 3 (three) times daily. What changed:  how much to take when to take this   insulin NPH Human 100 UNIT/ML injection Commonly known as: NovoLIN N Inject 0.18 mLs (18 Units total) into the skin every morning. Ands syringes 1/day What changed:  when to take this additional instructions   isosorbide mononitrate 60 MG 24 hr tablet Commonly known as: IMDUR Take 1 tablet (60 mg total) by mouth 2 (two) times daily.   Januvia 25 MG tablet Generic drug: sitaGLIPtin Take 25 mg by mouth daily.   nitroGLYCERIN 0.4 MG SL tablet Commonly known as: NITROSTAT Place 1 tablet (0.4 mg total) under the tongue every 5 (five) minutes as needed for chest pain. PLACE ONE TABLET UNDER THE TONGUE EVERY 5 MINUTES FOR 3 DOSES AS NEEDED Strength: 0.4 mg   polyethylene glycol 17 g packet Commonly known as: MIRALAX / GLYCOLAX Take 17 g by mouth daily.   pravastatin 40 MG tablet Commonly known as: PRAVACHOL Take 1 tablet (40 mg total) by mouth every evening.   Restasis 0.05 % ophthalmic emulsion Generic drug: cycloSPORINE Place 1 drop into both eyes 2 (two) times daily.   senna 8.6 MG Tabs tablet Commonly known as: SENOKOT Take 2 tablets (17.2 mg total) by mouth 2 (two) times daily.   traMADol 50 MG tablet Commonly known as: ULTRAM Take 50 mg by mouth 4 (four) times daily as needed for pain.   True Metrix Blood Glucose Test test strip Generic drug: glucose blood USE TO TEST BLOOD SUGAR UP TO TWICE  DAILY OR AS DIRECTED        Follow-up Information     Virl Cagey, MD Follow up on 09/19/2022.   Specialty: General Surgery Why: post cholecystostomy tube followed up Contact information: [address] [place] [facility] [id number] [phone]                Allergies  Allergen Reactions   Metformin Nausea And Vomiting   Niacin    Niacin And Related Other (See Comments)    Reaction: flushing and burning side effects even with  extended release    Consultations: General surgery, nephrology  Procedures/Studies: IR Perc Cholecystostomy  Result Date: 08/20/2022 INDICATION: 76 year old male with acute calculus cholecystitis, poor surgical candidate. EXAM: Ultrasound and fluoroscopic guided percutaneous cholecystostomy placement MEDICATIONS: 2.25 g Zosyn, intravenous; The antibiotic was administered within an appropriate time frame prior to the initiation of the procedure. ANESTHESIA/SEDATION: Moderate (conscious) sedation was employed during this procedure. A total of Versed 1 mg and Fentanyl 50 mcg was administered intravenously. Moderate Sedation Time: 11 minutes. The patient's level of consciousness and vital signs were monitored continuously by radiology nursing throughout the procedure under my direct supervision. FLUOROSCOPY TIME:  Two mGy COMPLICATIONS: None immediate. PROCEDURE: Informed written consent was obtained from the patient after a thorough discussion of the procedural risks, benefits and alternatives. All questions were addressed. Maximal Sterile Barrier Technique was utilized including caps, mask, sterile gowns, sterile gloves, sterile drape, hand hygiene and skin antiseptic. A timeout was performed prior to the initiation of the procedure. The patient was placed supine on the angiographic table. The patient's right upper quadrant was then prepped and draped in normal sterile fashion with maximum sterile barrier. Ultrasound demonstrates a distended gallbladder with multiple internal echoes compatible with cholelithiasis. Subdermal Local anesthesia was provided at the planned skin entry site. Under ultrasound guidance, deeper local anesthetic was provided through intercostal muscles and along the liver capsule. Ultrasound was used to puncture the gallbladder using a 18 gauge trocar needle via a subhepatic/trans peritoneal approach with visualization of the lung treated to the gallbladder. An Amplatz wire was advanced  into the lumen and a transition dilator placed. A gentle hand injection of contrast was performed. Cholecystogram demonstrates irregular filling defects consistent with gallstones. The cystic duct is obstructed. A 0.035 inch exchange wire was placed in the tract was dilated. A 10 French multipurpose drainage catheter was advanced into the gallbladder lumen. The drain was then secured in place using a 0-silk suture and a Stayfix device. A sterile dressing was applied. The tube was placed to bag drainage. A culture was sent to the lab for analysis. The patient tolerated procedure well without evidence of immediate complication was transferred back to the floor in stable condition. IMPRESSION: Successful placement of percutaneous, subhepatic/transperitoneal cholecystostomy tube. PLAN: Pending candidacy for cholecystectomy, plan for return in 8 weeks for initial routine cholecystostomy tube check and exchange. If deemed not a surgical candidate, the patient will be evaluated for possible candidacy for percutaneous choledochoscopic-assisted (Spyglass) gallstone retrieval and hopeful of eventual tube removal. Ruthann Cancer, MD [facility] Radiology Electronically Signed   By: Ruthann Cancer M.D.   On: 08/20/2022 14:02   US Abdomen Limited RUQ (LIVER/GB)  Result Date: 08/20/2022 CLINICAL DATA:  Choledocholithiasis EXAM: ULTRASOUND ABDOMEN LIMITED RIGHT UPPER QUADRANT COMPARISON:  CT abdomen/pelvis dated 08/19/2022 FINDINGS: Gallbladder: Cholelithiasis with distension, gallbladder wall thickening, pericholecystic fluid, and positive sonographic Murphy's sign. Common bile duct: Diameter: 4 mm Liver: The upper limits of  normal for parenchymal echogenicity. 9 mm hyperechoic lesion in the anterior right hepatic lobe, possibly reflecting a small hemangioma portal vein is patent on color Doppler imaging with normal direction of blood flow towards the liver. Other: None.  IMPRESSION: Cholelithiasis with suspected acute cholecystitis. No intrahepatic or extrahepatic ductal dilatation. Common duct measures 4 mm. Electronically Signed   By: Julian Hy M.D.   On: 08/20/2022 01:33   CT ABDOMEN PELVIS WO CONTRAST  Result Date: 08/19/2022 CLINICAL DATA:  emesis and abdominal pain since yesterday. EXAM: CT ABDOMEN AND PELVIS WITHOUT CONTRAST TECHNIQUE: Multidetector CT imaging of the abdomen and pelvis was performed following the standard protocol without IV contrast. RADIATION DOSE REDUCTION: This exam was performed according to the departmental dose-optimization program which includes automated exposure control, adjustment of the mA and/or kV according to patient size and/or use of iterative reconstruction technique. COMPARISON:  CT May 22, 2021. FINDINGS: Lower chest: Scarring/atelectasis in the bilateral lung bases. Hepatobiliary: Scattered bilobar hypodense hepatic lesions are technically too small to accurately characterize an incompletely evaluated on this noncontrast examination but appears similar prior and statistically likely to reflect benign cysts or hemangiomas. Gallbladder is distended with pericholecystic fluid and gallbladder wall thickening consistent with cholecystitis. No biliary ductal dilation. Pancreas: No pancreatic ductal dilation or evidence of acute inflammation. Spleen: Normal in size without focal abnormality. Adrenals/Urinary Tract: Bilateral adrenal glands appear normal. No hydronephrosis. Bilateral hypodense renal lesions measure fluid density consistent with cysts and considered benign requiring no independent imaging follow-up. Additional bilateral hypodense renal lesions are technically too small to accurately characterize but statistically likely to reflect cysts and considered benign requiring no independent imaging follow-up. Urinary bladder is unremarkable for degree of distension. Stomach/Bowel: No radiopaque enteric contrast material was  administered. Stomach is minimally distended limiting evaluation. No pathologic dilation of large or small bowel. Appendix is not confidently identified however there is no pericecal inflammation. Vascular/Lymphatic: Aortic atherosclerosis. No pathologically enlarged abdominal or pelvic lymph nodes. Reproductive: Prostate is mildly enlarged, unchanged. Other: No significant abdominopelvic free fluid. Musculoskeletal: Spinal stimulating generator in the posterior paramedian right lumbar spine soft tissues. Lumbosacral fixation hardware. Multilevel degenerative changes spine. IMPRESSION: 1. Acute cholecystitis, without evidence of perforation. 2. No evidence of bowel obstruction. 3.  Aortic Atherosclerosis (ICD10-I70.0). Electronically Signed   By: Dahlia Bailiff M.D.   On: 08/19/2022 08:17   DG Chest Port 1 View  Result Date: 08/19/2022 CLINICAL DATA:  Sepsis. EXAM: PORTABLE CHEST 1 VIEW COMPARISON:  August 06, 2022. FINDINGS: Stable cardiomegaly. Right internal jugular dialysis catheter is unchanged in position. Both lungs are clear. The visualized skeletal structures are unremarkable. IMPRESSION: No active disease. Electronically Signed   By: Marijo Conception M.D.   On: 08/19/2022 08:16   DG Abd 1 View  Result Date: 08/07/2022 CLINICAL DATA:  76 year old male with history of abdominal pain. EXAM: ABDOMEN - 1 VIEW COMPARISON:  05/22/2021, 03/15/2015 FINDINGS: Diffuse intraluminal gas without evidence of significant distension. Moderate colonic stool burden. No air-fluid levels. Spinal stimulator in place. Status post lumbar fusion. IMPRESSION: Nonspecific, nonobstructive bowel gas pattern. Moderate colonic stool burden. Electronically Signed   By: Ruthann Cancer M.D.   On: 08/07/2022 09:36   DG Chest Portable 1 View  Result Date: 08/06/2022 CLINICAL DATA:  Line placement, dialysis catheter EXAM: PORTABLE CHEST 1 VIEW COMPARISON:  07/31/2022 FINDINGS: Cardiomegaly. Large-bore right neck multi lumen vascular  catheter, tip positioned near the superior cavoatrial junction. Both lungs are clear. The visualized skeletal structures are unremarkable. IMPRESSION: 1.  Cardiomegaly without acute abnormality of the lungs in AP portable projection. 2. Large-bore right neck multi lumen vascular catheter, tip positioned near the superior cavoatrial junction. Electronically Signed   By: Delanna Ahmadi M.D.   On: 08/06/2022 08:38   DG C-Arm 1-60 Min-No Report  Result Date: 08/06/2022 Fluoroscopy was utilized by the requesting physician.  No radiographic interpretation.   DG CHEST PORT 1 VIEW  Result Date: 07/31/2022 CLINICAL DATA:  76 year old male status post endotracheal tube placement. Heart failure. EXAM: PORTABLE CHEST 1 VIEW COMPARISON:  Chest x-ray 07/31/2022. FINDINGS: An endotracheal tube is in place with tip 5.3 cm above the carina. A nasogastric tube is seen extending into the stomach, however, the tip of the nasogastric tube extends below the lower margin of the image. Lung volumes are low. Bibasilar opacities may reflect areas of atelectasis and/or consolidation, potentially with superimposed small left pleural effusion. No definite right pleural effusion. Ill-defined opacities and areas of interstitial prominence are also noted throughout the mid to lower lungs bilaterally. Crowding and cephalization of the pulmonary vasculature. No pneumothorax. Heart size is mildly enlarged. Upper mediastinal contours are within normal limits. IMPRESSION: 1. Support apparatus, as above. 2. The appearance the chest may suggest mild congestive heart failure. In addition, there are bibasilar opacities favored to reflect areas of subsegmental atelectasis, with superimposed small left pleural effusion. Electronically Signed   By: Vinnie Langton M.D.   On: 07/31/2022 05:18   DG Chest Portable 1 View  Result Date: 07/31/2022 CLINICAL DATA:  Endotracheal tube malposition EXAM: PORTABLE CHEST 1 VIEW COMPARISON:  None Available.  FINDINGS: The endotracheal tube is seen above the level of the clavicular heads 10.9 cm above the carina. The balloon appears hyperinflated within the hypopharynx. Nasogastric tube extends into the upper abdomen beyond the margin of the examination. Pulmonary insufflation is normal and stable since prior examination. No pneumothorax or pleural effusion. Mild cardiomegaly is stable. Superimposed bilateral perihilar pulmonary infiltrates appear improved in the interval since prior examination, infection versus edema. IMPRESSION: 1. High position of the endotracheal tube. The balloon appears hyperinflated within the hypopharynx. Advancement of the endotracheal tube by 5-6 cm would better position the device. 2. Improving bilateral perihilar pulmonary infiltrates, infection versus edema. 3. Stable cardiomegaly. Electronically Signed   By: Fidela Salisbury M.D.   On: 07/31/2022 04:37   DG Chest Port 1 View  Addendum Date: 07/30/2022   ADDENDUM REPORT: 07/30/2022 08:00 ADDENDUM: Additional image done at 3:30 p.m. on 07/29/2022 is submitted for review. Tip of NG tube is seen in the fundus of the stomach. Side port an NG tube is noted close to the gastroesophageal junction. Electronically Signed   By: Elmer Picker M.D.   On: 07/30/2022 08:00   Result Date: 07/30/2022 CLINICAL DATA:  Placement of endotracheal tube and orogastric tube EXAM: PORTABLE CHEST 1 VIEW COMPARISON:  Previous studies including the examination of 07/28/2022 FINDINGS: Transverse diameter heart is increased. There is interval worsening of pulmonary vascular congestion. Interval appearance of extensive alveolar densities are seen in the parahilar regions and lower lung fields, more so on the left side. Lateral CP angles are indistinct. There is no pneumothorax. Tip of endotracheal tube is 6.6 cm above the carina. NG tube is not visualized in chest. IMPRESSION: Cardiomegaly. There is interval worsening of pulmonary vascular congestion. Extensive  alveolar infiltrates are seen in both lungs suggesting pulmonary edema or multifocal pneumonia with interval worsening. NG tube is not visualized and may be coiled within the mouth or oropharynx.  Electronically Signed: By: Elmer Picker M.D. On: 07/29/2022 15:46   DG Chest Port 1 View  Result Date: 07/30/2022 CLINICAL DATA:  Chest pain and dyspnea. EXAM: PORTABLE CHEST 1 VIEW COMPARISON:  Portable chest 07/29/2022 FINDINGS: 4:55 a.m. ETT tip is 6.2 cm from the carina. Interval insertion NGT with the tip in the body of stomach. There is spinal stimulator wiring in the distal thoracic spinal canal. The heart is enlarged. The aorta is tortuous and ectatic with stable mediastinum. Aortic atherosclerosis. There is persistent perihilar vascular congestion and extensive bilateral perihilar infiltrates, more confluent on the left, with bilateral peripheral sparing and small pleural effusions. No new or worsening opacities seen. Overall aeration is generally stable. IMPRESSION: Persistent left-greater-than-right perihilar infiltrates with cardiomegaly and central vascular engorgement. Findings could be due to alveolar pulmonary edema, pneumonia or combination. Stable overall aeration noted today with no new or worsening infiltrates. Small pleural effusions appear similar. NGT tip in the body of stomach with stable ETT positioning. Aortic atherosclerosis. Electronically Signed   By: Telford Nab M.D.   On: 07/30/2022 07:21   ECHOCARDIOGRAM LIMITED  Result Date: 07/29/2022    ECHOCARDIOGRAM LIMITED REPORT   Patient Name:   [name] Date of Exam: 07/29/2022 Medical Rec #:  [id number]       Height:       74.0 in Accession #:    [id number]      Weight:       270.7 lb Date of Birth:  November 25, 1946       BSA:          2.472 m Patient Age:    33 years        BP:           168/79 mmHg Patient Gender: M               HR:           80 bpm. Exam Location:  [facility] Procedure: Limited Echo and Intracardiac Opacification  Agent Indications:    CHF  History:        Patient has prior history of Echocardiogram examinations, most                 recent 02/07/2022. CHF, CAD and Previous Myocardial Infarction,                 Arrythmias:PVC, Signs/Symptoms:Chest Pain; Risk                 Factors:Hypertension, Diabetes and Dyslipidemia. Patient on                 Bi-Pap.  Sonographer:    Wenda Low Referring Phys: [phone] Nadean Corwin HILTY  Sonographer Comments: Patient is morbidly obese. IMPRESSIONS  1. Limited echo for LVEF and regional wall motion.  2. No apical thrombus by Definity contrast. Left ventricular ejection fraction, by estimation, is 35 to 40%. Left ventricular ejection fraction by 2D MOD biplane is 36.6 %. The left ventricle has moderately decreased function. The left ventricle demonstrates severe global hypokinesis, worse at the apex. Regional wall motion is difficult to ascertain given frequent PVC's.  3. The inferior vena cava is dilated in size with <50% respiratory variability, suggesting right atrial pressure of 15 mmHg.  4. Right ventricular systolic function was not well visualized. The right ventricular size is not well visualized.  5. Rhythm strip during this exam demonstrates atrial fibrillation. Comparison(s): Changes from prior study are noted. 02/07/2022: LVEF 60-65%, no regional  WMA's. FINDINGS  Left Ventricle: No apical thrombus by Definity contrast. Left ventricular ejection fraction, by estimation, is 35 to 40%. Left ventricular ejection fraction by 2D MOD biplane is 36.6 %. The left ventricle has moderately decreased function. The left ventricle demonstrates global hypokinesis. Definity contrast agent was given IV to delineate the left ventricular endocardial borders. The left ventricular internal cavity size was normal in size. There is moderate left ventricular hypertrophy. Right Ventricle: The right ventricular size is not well visualized. Right vetricular wall thickness was not well visualized. Right  ventricular systolic function was not well visualized. Aorta: The aortic root and ascending aorta are structurally normal, with no evidence of dilitation. Venous: The inferior vena cava is dilated in size with less than 50% respiratory variability, suggesting right atrial pressure of 15 mmHg. EKG: Rhythm strip during this exam demonstrates atrial fibrillation. LEFT VENTRICLE PLAX 2D                        Biplane EF (MOD) LVIDd:         6.80 cm         LV Biplane EF:   Left LVIDs:         4.60 cm                          ventricular LV PW:         1.40 cm                          ejection LV IVS:        1.50 cm                          fraction by LVOT diam:     2.00 cm                          2D MOD LVOT Area:     3.14 cm                         biplane is                                                 36.6 %.  LV Volumes (MOD) LV vol d, MOD    122.0 ml A2C: LV vol d, MOD    128.0 ml A4C: LV vol s, MOD    74.4 ml A2C: LV vol s, MOD    89.6 ml A4C: LV SV MOD A2C:   47.6 ml LV SV MOD A4C:   128.0 ml LV SV MOD BP:    49.2 ml LEFT ATRIUM         Index LA diam:    5.50 cm 2.22 cm/m   AORTA Ao Root diam: 3.30 cm  SHUNTS Systemic Diam: 2.00 cm Lyman Bishop MD Electronically signed by Lyman Bishop MD Signature Date/Time: 07/29/2022/4:28:09 PM    Final    DG Chest Portable 1 View  Result Date: 07/28/2022 CLINICAL DATA:  Shortness of breath, chest pain EXAM: PORTABLE CHEST 1 VIEW COMPARISON:  05/29/2022 FINDINGS: Cardiomegaly, vascular congestion. No confluent opacities, effusions or edema. No acute bony abnormality. IMPRESSION: Cardiomegaly, vascular congestion.  Electronically Signed   By: Rolm Baptise M.D.   On: 07/28/2022 23:46     Subjective: No acute issues or events overnight   Discharge Exam: Vitals:   08/23/22 1100 08/23/22 1134  BP: (!) 120/52 111/63  Pulse: 65 (!) 48  Resp: 20 (!) 24  Temp:    SpO2: 100% 100%   Vitals:   08/23/22 1000 08/23/22 1030 08/23/22 1100 08/23/22 1134  BP: (!) 131/49  124/65 (!) 120/52 111/63  Pulse: 70 65 65 (!) 48  Resp: 18 18 20  (!) 24  Temp:      TempSrc:      SpO2: 100% 100% 100% 100%  Weight:      Height:        General: Pt is alert, awake, not in acute distress Cardiovascular: RRR, S1/S2 +, no rubs, no gallops Respiratory: CTA bilaterally, no wheezing, no rhonchi Abdominal: Soft, NT, ND, bowel sounds + cholecystostomy tube clean dry intact draining bilious nonpurulent fluid Extremities: no edema, no cyanosis    The results of significant diagnostics from this hospitalization (including imaging, microbiology, ancillary and laboratory) are listed below for reference.     Microbiology: Recent Results (from the past 240 hour(s))  Blood Culture (routine x 2)     Status: None (Preliminary result)   Collection Time: 08/19/22  7:32 AM   Specimen: Left Antecubital; Blood  Result Value Ref Range Status   Specimen Description   Final    LEFT ANTECUBITAL BOTTLES DRAWN AEROBIC AND ANAEROBIC   Special Requests Blood Culture adequate volume  Final   Culture   Final    NO GROWTH 4 DAYS Performed at [facility], [address], [place], [place] [address]    Report Status PENDING  Incomplete  Blood Culture (routine x 2)     Status: None (Preliminary result)   Collection Time: 08/19/22  7:40 AM   Specimen: BLOOD RIGHT FOREARM  Result Value Ref Range Status   Specimen Description   Final    BLOOD RIGHT FOREARM BOTTLES DRAWN AEROBIC AND ANAEROBIC   Special Requests   Final    Blood Culture results may not be optimal due to an inadequate volume of blood received in culture bottles   Culture   Final    NO GROWTH 4 DAYS Performed at [facility], [address], [place], [place] [address]    Report Status PENDING  Incomplete  Aerobic/Anaerobic Culture w Gram Stain (surgical/deep wound)     Status: None (Preliminary result)   Collection Time: 08/20/22  1:13 PM   Specimen: Gallbladder; Abscess  Result Value Ref Range Status   Specimen Description  GALL BLADDER  Final   Special Requests NONE  Final   Gram Stain   Final    MODERATE YEAST WITH PSEUDOHYPHAE RARE WBC PRESENT, PREDOMINANTLY PMN Performed at [facility], [address], [place], [place] [address]    Culture   Final    MODERATE CANDIDA ALBICANS NO ANAEROBES ISOLATED; CULTURE IN PROGRESS FOR 5 DAYS    Report Status PENDING  Incomplete     Labs: BNP (last 3 results) Recent Labs    08/08/22 0425 08/09/22 0511 08/10/22 0602  BNP 139.0* 170.0* 417.4*   Basic Metabolic Panel: Recent Labs  Lab 08/20/22 0114 08/21/22 0303 08/21/22 1336 08/22/22 0118 08/23/22 0217  NA 137 140 140 137 136  K 4.1 3.8 4.0 3.4* 3.5  CL 95* 97* 95* 95* 93*  CO2 25 26 26 25 26   GLUCOSE 112*  85 98 83 134*  BUN 31* 50* 58* 29* 59*  CREATININE 3.35* 5.49* 6.50* 3.90* 5.90*  CALCIUM 9.0 9.2 9.6 9.1 9.4  MG 1.9  --   --   --   --   PHOS  --   --  4.6  --   --    Liver Function Tests: Recent Labs  Lab 08/19/22 0732 08/20/22 0114 08/21/22 0303 08/21/22 1336 08/22/22 0118 08/23/22 0217  AST 711* 402* 257*  --  168* 151*  ALT 273* 252* 195*  --  148* 126*  ALKPHOS 613* 673* 599*  --  531* 542*  BILITOT 3.3* 5.1* 7.3*  --  6.8* 3.5*  PROT 8.2* 7.0 6.8  --  6.7 7.2  ALBUMIN 3.3* 2.8* 2.3* 2.3* 2.2* 2.2*   Recent Labs  Lab 08/19/22 0928 08/20/22 0114  LIPASE 925* 136*   No results for input(s): "AMMONIA" in the last 168 hours. CBC: Recent Labs  Lab 08/19/22 0732 08/20/22 0114 08/21/22 0303 08/22/22 0118 08/23/22 0217  WBC 24.9* 19.4* 12.3* 11.4* 10.5  NEUTROABS 20.9*  --   --   --   --   HGB 9.1* 9.3* 8.2* 8.1* 8.5*  HCT 28.2* 27.6* 25.3* 25.0* 24.4*  MCV 98.6 94.8 95.8 95.8 90.7  PLT 215 186 191 183 214   Cardiac Enzymes: No results for input(s): "CKTOTAL", "CKMB", "CKMBINDEX", "TROPONINI" in the last 168 hours. BNP: Invalid input(s): "POCBNP" CBG: Recent Labs  Lab 08/20/22 0017  GLUCAP 121*   D-Dimer No results for input(s): "DDIMER" in the last  72 hours. Hgb A1c No results for input(s): "HGBA1C" in the last 72 hours. Lipid Profile No results for input(s): "CHOL", "HDL", "LDLCALC", "TRIG", "CHOLHDL", "LDLDIRECT" in the last 72 hours. Thyroid function studies No results for input(s): "TSH", "T4TOTAL", "T3FREE", "THYROIDAB" in the last 72 hours.  Invalid input(s): "FREET3" Anemia work up No results for input(s): "VITAMINB12", "FOLATE", "FERRITIN", "TIBC", "IRON", "RETICCTPCT" in the last 72 hours. Urinalysis    Component Value Date/Time   COLORURINE YELLOW 08/01/2022 1629   APPEARANCEUR CLEAR 08/01/2022 1629   LABSPEC 1.011 08/01/2022 1629   PHURINE 5.0 08/01/2022 1629   GLUCOSEU NEGATIVE 08/01/2022 1629   HGBUR NEGATIVE 08/01/2022 1629   BILIRUBINUR NEGATIVE 08/01/2022 1629   KETONESUR NEGATIVE 08/01/2022 1629   PROTEINUR 30 (A) 08/01/2022 1629   UROBILINOGEN 1.0 03/02/2015 1100   NITRITE NEGATIVE 08/01/2022 1629   LEUKOCYTESUR NEGATIVE 08/01/2022 1629   Sepsis Labs Recent Labs  Lab 08/20/22 0114 08/21/22 0303 08/22/22 0118 08/23/22 0217  WBC 19.4* 12.3* 11.4* 10.5   Microbiology Recent Results (from the past 240 hour(s))  Blood Culture (routine x 2)     Status: None (Preliminary result)   Collection Time: 08/19/22  7:32 AM   Specimen: Left Antecubital; Blood  Result Value Ref Range Status   Specimen Description   Final    LEFT ANTECUBITAL BOTTLES DRAWN AEROBIC AND ANAEROBIC   Special Requests Blood Culture adequate volume  Final   Culture   Final    NO GROWTH 4 DAYS Performed at [facility], [address], [place], [place] [address]    Report Status PENDING  Incomplete  Blood Culture (routine x 2)     Status: None (Preliminary result)   Collection Time: 08/19/22  7:40 AM   Specimen: BLOOD RIGHT FOREARM  Result Value Ref Range Status   Specimen Description   Final    BLOOD RIGHT FOREARM BOTTLES DRAWN AEROBIC AND ANAEROBIC   Special Requests  Final    Blood Culture results may not be optimal due to an  inadequate volume of blood received in culture bottles   Culture   Final    NO GROWTH 4 DAYS Performed at [facility], [address] [place], [place] [address]    Report Status PENDING  Incomplete  Aerobic/Anaerobic Culture w Gram Stain (surgical/deep wound)     Status: None (Preliminary result)   Collection Time: 08/20/22  1:13 PM   Specimen: Gallbladder; Abscess  Result Value Ref Range Status   Specimen Description GALL BLADDER  Final   Special Requests NONE  Final   Gram Stain   Final    MODERATE YEAST WITH PSEUDOHYPHAE RARE WBC PRESENT, PREDOMINANTLY PMN Performed at [facility], [address], [place], [place] [address]    Culture   Final    MODERATE CANDIDA ALBICANS NO ANAEROBES ISOLATED; CULTURE IN PROGRESS FOR 5 DAYS    Report Status PENDING  Incomplete     Time coordinating discharge: Over 30 minutes  SIGNED:   Little Ishikawa, DO [facility] 08/23/2022, 12:04 PM Pager   If 7PM-7AM, please contact night-coverage [email or web address]

## 2022-08-23 NOTE — TOC Transition Note (Signed)
Transition of Care [facility]) - CM/SW Discharge Note   Patient Details  Name: [name] MRN: [id number] Date of Birth: 14-Sep-1946  [facility] CM/SW Contact:  Tresa Endo Phone Number: 08/23/2022, 2:15 PM   Clinical Narrative:    Patient will DC to: [facility] Anticipated DC date: 08/23/2022 Family notified: Pt Brother Transport by: Corey Harold   Per MD patient ready for DC to [facility]. RN to call report prior to discharge [phone]). RN, patient, patient's family, and facility notified of DC. Discharge Summary and FL2 sent to facility. DC packet on chart. Ambulance transport requested for patient.   CSW will sign off for now as social work intervention is no longer needed. Please consult [place] again if new needs arise.     Final next level of care: [place]     Patient Goals and CMS Choice Patient states their goals for this hospitalization and ongoing recovery are:: different SNF      Discharge Placement                       Discharge Plan and Services     Post Acute Care Choice: [place]          DME Arranged: N/A         HH Arranged: NA          Social Determinants of Health (SDOH) Interventions     Readmission Risk Interventions    08/02/2022    2:23 PM  Readmission Risk Prevention Plan  Transportation Screening Complete  Medication Review (RN Care Manager) Complete  HRI or Home Care Consult Complete  SW Recovery Care/Counseling Consult Complete  Palliative Care Screening Not Applicable  Skilled Nursing Facility Complete

## 2022-08-23 NOTE — Progress Notes (Signed)
Interventional Radiology Brief Note:  Noted patient is discharging to SNF with percutaneous cholecystostomy tube in place.  Instructions for drain-related care put in AVS.  Schedulers will reach out to arrange 8 week follow-up for injection and exchange at [facility].   Brynda Greathouse, MS RD PA-C

## 2022-08-24 LAB — CULTURE, BLOOD (ROUTINE X 2)
Culture: NO GROWTH
Culture: NO GROWTH
Special Requests: ADEQUATE

## 2022-08-25 LAB — AEROBIC/ANAEROBIC CULTURE W GRAM STAIN (SURGICAL/DEEP WOUND)

## 2022-08-26 DIAGNOSIS — D689 Coagulation defect, unspecified: Secondary | ICD-10-CM | POA: Diagnosis not present

## 2022-08-26 DIAGNOSIS — E1122 Type 2 diabetes mellitus with diabetic chronic kidney disease: Secondary | ICD-10-CM | POA: Diagnosis not present

## 2022-08-26 DIAGNOSIS — R52 Pain, unspecified: Secondary | ICD-10-CM | POA: Diagnosis not present

## 2022-08-26 DIAGNOSIS — Z992 Dependence on renal dialysis: Secondary | ICD-10-CM | POA: Diagnosis not present

## 2022-08-26 DIAGNOSIS — N186 End stage renal disease: Secondary | ICD-10-CM | POA: Diagnosis not present

## 2022-08-26 DIAGNOSIS — N2581 Secondary hyperparathyroidism of renal origin: Secondary | ICD-10-CM | POA: Diagnosis not present

## 2022-08-26 DIAGNOSIS — D631 Anemia in chronic kidney disease: Secondary | ICD-10-CM | POA: Diagnosis not present

## 2022-08-26 DIAGNOSIS — Z23 Encounter for immunization: Secondary | ICD-10-CM | POA: Diagnosis not present

## 2022-08-27 DIAGNOSIS — R5381 Other malaise: Secondary | ICD-10-CM | POA: Diagnosis not present

## 2022-08-27 DIAGNOSIS — K810 Acute cholecystitis: Secondary | ICD-10-CM | POA: Diagnosis not present

## 2022-08-28 DIAGNOSIS — L89152 Pressure ulcer of sacral region, stage 2: Secondary | ICD-10-CM | POA: Diagnosis not present

## 2022-08-28 DIAGNOSIS — D631 Anemia in chronic kidney disease: Secondary | ICD-10-CM | POA: Diagnosis not present

## 2022-08-28 DIAGNOSIS — Z23 Encounter for immunization: Secondary | ICD-10-CM | POA: Diagnosis not present

## 2022-08-28 DIAGNOSIS — E1122 Type 2 diabetes mellitus with diabetic chronic kidney disease: Secondary | ICD-10-CM | POA: Diagnosis not present

## 2022-08-28 DIAGNOSIS — R52 Pain, unspecified: Secondary | ICD-10-CM | POA: Diagnosis not present

## 2022-08-28 DIAGNOSIS — Z992 Dependence on renal dialysis: Secondary | ICD-10-CM | POA: Diagnosis not present

## 2022-08-28 DIAGNOSIS — D689 Coagulation defect, unspecified: Secondary | ICD-10-CM | POA: Diagnosis not present

## 2022-08-28 DIAGNOSIS — N2581 Secondary hyperparathyroidism of renal origin: Secondary | ICD-10-CM | POA: Diagnosis not present

## 2022-08-28 DIAGNOSIS — N186 End stage renal disease: Secondary | ICD-10-CM | POA: Diagnosis not present

## 2022-08-30 DIAGNOSIS — I10 Essential (primary) hypertension: Secondary | ICD-10-CM | POA: Diagnosis not present

## 2022-08-30 DIAGNOSIS — D689 Coagulation defect, unspecified: Secondary | ICD-10-CM | POA: Diagnosis not present

## 2022-08-30 DIAGNOSIS — M549 Dorsalgia, unspecified: Secondary | ICD-10-CM | POA: Diagnosis not present

## 2022-08-30 DIAGNOSIS — E119 Type 2 diabetes mellitus without complications: Secondary | ICD-10-CM | POA: Diagnosis not present

## 2022-08-30 DIAGNOSIS — E785 Hyperlipidemia, unspecified: Secondary | ICD-10-CM | POA: Diagnosis not present

## 2022-08-30 DIAGNOSIS — D631 Anemia in chronic kidney disease: Secondary | ICD-10-CM | POA: Diagnosis not present

## 2022-08-30 DIAGNOSIS — Z794 Long term (current) use of insulin: Secondary | ICD-10-CM | POA: Diagnosis not present

## 2022-08-30 DIAGNOSIS — N186 End stage renal disease: Secondary | ICD-10-CM | POA: Diagnosis not present

## 2022-08-30 DIAGNOSIS — N2581 Secondary hyperparathyroidism of renal origin: Secondary | ICD-10-CM | POA: Diagnosis not present

## 2022-08-30 DIAGNOSIS — I5042 Chronic combined systolic (congestive) and diastolic (congestive) heart failure: Secondary | ICD-10-CM | POA: Diagnosis not present

## 2022-08-30 DIAGNOSIS — R52 Pain, unspecified: Secondary | ICD-10-CM | POA: Diagnosis not present

## 2022-08-30 DIAGNOSIS — R5381 Other malaise: Secondary | ICD-10-CM | POA: Diagnosis not present

## 2022-08-30 DIAGNOSIS — Z23 Encounter for immunization: Secondary | ICD-10-CM | POA: Diagnosis not present

## 2022-08-30 DIAGNOSIS — K810 Acute cholecystitis: Secondary | ICD-10-CM | POA: Diagnosis not present

## 2022-08-30 DIAGNOSIS — E1122 Type 2 diabetes mellitus with diabetic chronic kidney disease: Secondary | ICD-10-CM | POA: Diagnosis not present

## 2022-08-30 DIAGNOSIS — G8929 Other chronic pain: Secondary | ICD-10-CM | POA: Diagnosis not present

## 2022-08-30 DIAGNOSIS — Z992 Dependence on renal dialysis: Secondary | ICD-10-CM | POA: Diagnosis not present

## 2022-09-02 DIAGNOSIS — N2581 Secondary hyperparathyroidism of renal origin: Secondary | ICD-10-CM | POA: Diagnosis not present

## 2022-09-02 DIAGNOSIS — E1122 Type 2 diabetes mellitus with diabetic chronic kidney disease: Secondary | ICD-10-CM | POA: Diagnosis not present

## 2022-09-02 DIAGNOSIS — D631 Anemia in chronic kidney disease: Secondary | ICD-10-CM | POA: Diagnosis not present

## 2022-09-02 DIAGNOSIS — D689 Coagulation defect, unspecified: Secondary | ICD-10-CM | POA: Diagnosis not present

## 2022-09-02 DIAGNOSIS — N186 End stage renal disease: Secondary | ICD-10-CM | POA: Diagnosis not present

## 2022-09-02 DIAGNOSIS — Z23 Encounter for immunization: Secondary | ICD-10-CM | POA: Diagnosis not present

## 2022-09-02 DIAGNOSIS — Z992 Dependence on renal dialysis: Secondary | ICD-10-CM | POA: Diagnosis not present

## 2022-09-02 DIAGNOSIS — R52 Pain, unspecified: Secondary | ICD-10-CM | POA: Diagnosis not present

## 2022-09-04 DIAGNOSIS — Z992 Dependence on renal dialysis: Secondary | ICD-10-CM | POA: Diagnosis not present

## 2022-09-04 DIAGNOSIS — D689 Coagulation defect, unspecified: Secondary | ICD-10-CM | POA: Diagnosis not present

## 2022-09-04 DIAGNOSIS — N186 End stage renal disease: Secondary | ICD-10-CM | POA: Diagnosis not present

## 2022-09-04 DIAGNOSIS — L89132 Pressure ulcer of right lower back, stage 2: Secondary | ICD-10-CM | POA: Diagnosis not present

## 2022-09-04 DIAGNOSIS — R52 Pain, unspecified: Secondary | ICD-10-CM | POA: Diagnosis not present

## 2022-09-04 DIAGNOSIS — N2581 Secondary hyperparathyroidism of renal origin: Secondary | ICD-10-CM | POA: Diagnosis not present

## 2022-09-04 DIAGNOSIS — D631 Anemia in chronic kidney disease: Secondary | ICD-10-CM | POA: Diagnosis not present

## 2022-09-04 DIAGNOSIS — Z23 Encounter for immunization: Secondary | ICD-10-CM | POA: Diagnosis not present

## 2022-09-04 DIAGNOSIS — E1122 Type 2 diabetes mellitus with diabetic chronic kidney disease: Secondary | ICD-10-CM | POA: Diagnosis not present

## 2022-09-06 DIAGNOSIS — D631 Anemia in chronic kidney disease: Secondary | ICD-10-CM | POA: Diagnosis not present

## 2022-09-06 DIAGNOSIS — D689 Coagulation defect, unspecified: Secondary | ICD-10-CM | POA: Diagnosis not present

## 2022-09-06 DIAGNOSIS — N186 End stage renal disease: Secondary | ICD-10-CM | POA: Diagnosis not present

## 2022-09-06 DIAGNOSIS — Z23 Encounter for immunization: Secondary | ICD-10-CM | POA: Diagnosis not present

## 2022-09-06 DIAGNOSIS — E1122 Type 2 diabetes mellitus with diabetic chronic kidney disease: Secondary | ICD-10-CM | POA: Diagnosis not present

## 2022-09-06 DIAGNOSIS — N2581 Secondary hyperparathyroidism of renal origin: Secondary | ICD-10-CM | POA: Diagnosis not present

## 2022-09-06 DIAGNOSIS — R52 Pain, unspecified: Secondary | ICD-10-CM | POA: Diagnosis not present

## 2022-09-06 DIAGNOSIS — Z992 Dependence on renal dialysis: Secondary | ICD-10-CM | POA: Diagnosis not present

## 2022-09-09 DIAGNOSIS — D631 Anemia in chronic kidney disease: Secondary | ICD-10-CM | POA: Diagnosis not present

## 2022-09-09 DIAGNOSIS — E785 Hyperlipidemia, unspecified: Secondary | ICD-10-CM | POA: Diagnosis not present

## 2022-09-09 DIAGNOSIS — N186 End stage renal disease: Secondary | ICD-10-CM | POA: Diagnosis not present

## 2022-09-09 DIAGNOSIS — D638 Anemia in other chronic diseases classified elsewhere: Secondary | ICD-10-CM | POA: Diagnosis not present

## 2022-09-09 DIAGNOSIS — N2581 Secondary hyperparathyroidism of renal origin: Secondary | ICD-10-CM | POA: Diagnosis not present

## 2022-09-09 DIAGNOSIS — M5136 Other intervertebral disc degeneration, lumbar region: Secondary | ICD-10-CM | POA: Diagnosis not present

## 2022-09-09 DIAGNOSIS — R5381 Other malaise: Secondary | ICD-10-CM | POA: Diagnosis not present

## 2022-09-09 DIAGNOSIS — D689 Coagulation defect, unspecified: Secondary | ICD-10-CM | POA: Diagnosis not present

## 2022-09-09 DIAGNOSIS — R52 Pain, unspecified: Secondary | ICD-10-CM | POA: Diagnosis not present

## 2022-09-09 DIAGNOSIS — I2510 Atherosclerotic heart disease of native coronary artery without angina pectoris: Secondary | ICD-10-CM | POA: Diagnosis not present

## 2022-09-09 DIAGNOSIS — Z992 Dependence on renal dialysis: Secondary | ICD-10-CM | POA: Diagnosis not present

## 2022-09-09 DIAGNOSIS — I5042 Chronic combined systolic (congestive) and diastolic (congestive) heart failure: Secondary | ICD-10-CM | POA: Diagnosis not present

## 2022-09-09 DIAGNOSIS — E1122 Type 2 diabetes mellitus with diabetic chronic kidney disease: Secondary | ICD-10-CM | POA: Diagnosis not present

## 2022-09-09 DIAGNOSIS — Z23 Encounter for immunization: Secondary | ICD-10-CM | POA: Diagnosis not present

## 2022-09-09 DIAGNOSIS — E119 Type 2 diabetes mellitus without complications: Secondary | ICD-10-CM | POA: Diagnosis not present

## 2022-09-11 DIAGNOSIS — Z23 Encounter for immunization: Secondary | ICD-10-CM | POA: Diagnosis not present

## 2022-09-11 DIAGNOSIS — N186 End stage renal disease: Secondary | ICD-10-CM | POA: Diagnosis not present

## 2022-09-11 DIAGNOSIS — D631 Anemia in chronic kidney disease: Secondary | ICD-10-CM | POA: Diagnosis not present

## 2022-09-11 DIAGNOSIS — N2581 Secondary hyperparathyroidism of renal origin: Secondary | ICD-10-CM | POA: Diagnosis not present

## 2022-09-11 DIAGNOSIS — Z992 Dependence on renal dialysis: Secondary | ICD-10-CM | POA: Diagnosis not present

## 2022-09-11 DIAGNOSIS — E1122 Type 2 diabetes mellitus with diabetic chronic kidney disease: Secondary | ICD-10-CM | POA: Diagnosis not present

## 2022-09-11 DIAGNOSIS — D689 Coagulation defect, unspecified: Secondary | ICD-10-CM | POA: Diagnosis not present

## 2022-09-11 DIAGNOSIS — R52 Pain, unspecified: Secondary | ICD-10-CM | POA: Diagnosis not present

## 2022-09-12 DIAGNOSIS — K219 Gastro-esophageal reflux disease without esophagitis: Secondary | ICD-10-CM | POA: Diagnosis not present

## 2022-09-12 DIAGNOSIS — E782 Mixed hyperlipidemia: Secondary | ICD-10-CM | POA: Diagnosis not present

## 2022-09-12 DIAGNOSIS — M1990 Unspecified osteoarthritis, unspecified site: Secondary | ICD-10-CM | POA: Diagnosis not present

## 2022-09-12 DIAGNOSIS — M5136 Other intervertebral disc degeneration, lumbar region: Secondary | ICD-10-CM | POA: Diagnosis not present

## 2022-09-12 DIAGNOSIS — I2510 Atherosclerotic heart disease of native coronary artery without angina pectoris: Secondary | ICD-10-CM | POA: Diagnosis not present

## 2022-09-12 DIAGNOSIS — D631 Anemia in chronic kidney disease: Secondary | ICD-10-CM | POA: Diagnosis not present

## 2022-09-12 DIAGNOSIS — E1140 Type 2 diabetes mellitus with diabetic neuropathy, unspecified: Secondary | ICD-10-CM | POA: Diagnosis not present

## 2022-09-12 DIAGNOSIS — K810 Acute cholecystitis: Secondary | ICD-10-CM | POA: Diagnosis not present

## 2022-09-12 DIAGNOSIS — G8929 Other chronic pain: Secondary | ICD-10-CM | POA: Diagnosis not present

## 2022-09-12 DIAGNOSIS — F32A Depression, unspecified: Secondary | ICD-10-CM | POA: Diagnosis not present

## 2022-09-12 DIAGNOSIS — Z7984 Long term (current) use of oral hypoglycemic drugs: Secondary | ICD-10-CM | POA: Diagnosis not present

## 2022-09-12 DIAGNOSIS — I5042 Chronic combined systolic (congestive) and diastolic (congestive) heart failure: Secondary | ICD-10-CM | POA: Diagnosis not present

## 2022-09-12 DIAGNOSIS — R42 Dizziness and giddiness: Secondary | ICD-10-CM | POA: Diagnosis not present

## 2022-09-12 DIAGNOSIS — Z434 Encounter for attention to other artificial openings of digestive tract: Secondary | ICD-10-CM | POA: Diagnosis not present

## 2022-09-12 DIAGNOSIS — E1122 Type 2 diabetes mellitus with diabetic chronic kidney disease: Secondary | ICD-10-CM | POA: Diagnosis not present

## 2022-09-12 DIAGNOSIS — I132 Hypertensive heart and chronic kidney disease with heart failure and with stage 5 chronic kidney disease, or end stage renal disease: Secondary | ICD-10-CM | POA: Diagnosis not present

## 2022-09-12 DIAGNOSIS — M1A30X0 Chronic gout due to renal impairment, unspecified site, without tophus (tophi): Secondary | ICD-10-CM | POA: Diagnosis not present

## 2022-09-12 DIAGNOSIS — I480 Paroxysmal atrial fibrillation: Secondary | ICD-10-CM | POA: Diagnosis not present

## 2022-09-12 DIAGNOSIS — L89152 Pressure ulcer of sacral region, stage 2: Secondary | ICD-10-CM | POA: Diagnosis not present

## 2022-09-12 DIAGNOSIS — Z794 Long term (current) use of insulin: Secondary | ICD-10-CM | POA: Diagnosis not present

## 2022-09-12 DIAGNOSIS — N186 End stage renal disease: Secondary | ICD-10-CM | POA: Diagnosis not present

## 2022-09-13 ENCOUNTER — Emergency Department (HOSPITAL_COMMUNITY): Payer: Medicare Other

## 2022-09-13 ENCOUNTER — Encounter (HOSPITAL_COMMUNITY): Payer: Self-pay

## 2022-09-13 ENCOUNTER — Other Ambulatory Visit: Payer: Self-pay

## 2022-09-13 ENCOUNTER — Inpatient Hospital Stay (HOSPITAL_COMMUNITY)
Admission: EM | Admit: 2022-09-13 | Discharge: 2022-09-16 | DRG: 314 | Disposition: A | Discharge status: Home Health | Payer: Medicare Other | Attending: Internal Medicine | Admitting: Internal Medicine

## 2022-09-13 DIAGNOSIS — Z992 Dependence on renal dialysis: Secondary | ICD-10-CM | POA: Diagnosis not present

## 2022-09-13 DIAGNOSIS — K219 Gastro-esophageal reflux disease without esophagitis: Secondary | ICD-10-CM | POA: Diagnosis present

## 2022-09-13 DIAGNOSIS — Z79899 Other long term (current) drug therapy: Secondary | ICD-10-CM

## 2022-09-13 DIAGNOSIS — Z955 Presence of coronary angioplasty implant and graft: Secondary | ICD-10-CM

## 2022-09-13 DIAGNOSIS — I132 Hypertensive heart and chronic kidney disease with heart failure and with stage 5 chronic kidney disease, or end stage renal disease: Secondary | ICD-10-CM | POA: Diagnosis present

## 2022-09-13 DIAGNOSIS — N400 Enlarged prostate without lower urinary tract symptoms: Secondary | ICD-10-CM | POA: Diagnosis present

## 2022-09-13 DIAGNOSIS — M1A9XX1 Chronic gout, unspecified, 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ophi): Secondary | ICD-10-CM | POA: Diagnosis not present

## 2022-09-13 DIAGNOSIS — Z23 Encounter for immunization: Secondary | ICD-10-CM

## 2022-09-13 DIAGNOSIS — E669 Obesity, unspecified: Secondary | ICD-10-CM | POA: Diagnosis present

## 2022-09-13 DIAGNOSIS — Z96653 Presence of artificial knee joint, bilateral: Secondary | ICD-10-CM | POA: Diagnosis present

## 2022-09-13 DIAGNOSIS — E860 Dehydration: Secondary | ICD-10-CM | POA: Diagnosis not present

## 2022-09-13 DIAGNOSIS — E7800 Pure hypercholesterolemia, unspecified: Secondary | ICD-10-CM | POA: Diagnosis not present

## 2022-09-13 DIAGNOSIS — Z743 Need for continuous supervision: Secondary | ICD-10-CM | POA: Diagnosis not present

## 2022-09-13 DIAGNOSIS — E1129 Type 2 diabetes mellitus with other diabetic kidney complication: Secondary | ICD-10-CM | POA: Diagnosis not present

## 2022-09-13 DIAGNOSIS — R6520 Severe sepsis without septic shock: Secondary | ICD-10-CM | POA: Diagnosis not present

## 2022-09-13 DIAGNOSIS — E1122 Type 2 diabetes mellitus with diabetic chronic kidney disease: Secondary | ICD-10-CM | POA: Diagnosis present

## 2022-09-13 DIAGNOSIS — A419 Sepsis, unspecified organism: Secondary | ICD-10-CM | POA: Diagnosis not present

## 2022-09-13 DIAGNOSIS — Z6828 Body mass index (BMI) 28.0-28.9, adult: Secondary | ICD-10-CM

## 2022-09-13 DIAGNOSIS — N2581 Secondary hyperparathyroidism of renal origin: Secondary | ICD-10-CM | POA: Diagnosis not present

## 2022-09-13 DIAGNOSIS — E1169 Type 2 diabetes mellitus with other specified complication: Secondary | ICD-10-CM | POA: Diagnosis present

## 2022-09-13 DIAGNOSIS — Z888 Allergy status to other drugs, medicaments and biological substances status: Secondary | ICD-10-CM

## 2022-09-13 DIAGNOSIS — I2510 Atherosclerotic heart disease of native coronary artery without angina pectoris: Secondary | ICD-10-CM | POA: Diagnosis not present

## 2022-09-13 DIAGNOSIS — Z794 Long term (current) use of insulin: Secondary | ICD-10-CM | POA: Diagnosis not present

## 2022-09-13 DIAGNOSIS — N186 End stage renal disease: Secondary | ICD-10-CM | POA: Diagnosis not present

## 2022-09-13 DIAGNOSIS — Z86010 Personal history of colonic polyps: Secondary | ICD-10-CM

## 2022-09-13 DIAGNOSIS — R52 Pain, unspecified: Secondary | ICD-10-CM | POA: Diagnosis not present

## 2022-09-13 DIAGNOSIS — K810 Acute cholecystitis: Secondary | ICD-10-CM | POA: Diagnosis not present

## 2022-09-13 DIAGNOSIS — Z7982 Long term (current) use of aspirin: Secondary | ICD-10-CM

## 2022-09-13 DIAGNOSIS — I959 Hypotension, unspecified: Principal | ICD-10-CM | POA: Diagnosis present

## 2022-09-13 DIAGNOSIS — E11649 Type 2 diabetes mellitus with hypoglycemia without coma: Secondary | ICD-10-CM | POA: Diagnosis not present

## 2022-09-13 DIAGNOSIS — Z833 Family history of diabetes mellitus: Secondary | ICD-10-CM

## 2022-09-13 DIAGNOSIS — Z87891 Personal history of nicotine dependence: Secondary | ICD-10-CM

## 2022-09-13 DIAGNOSIS — D509 Iron deficiency anemia, unspecified: Secondary | ICD-10-CM | POA: Diagnosis present

## 2022-09-13 DIAGNOSIS — I252 Old myocardial infarction: Secondary | ICD-10-CM | POA: Diagnosis not present

## 2022-09-13 DIAGNOSIS — G629 Polyneuropathy, unspecified: Secondary | ICD-10-CM | POA: Diagnosis not present

## 2022-09-13 DIAGNOSIS — I5032 Chronic diastolic (congestive) heart failure: Secondary | ICD-10-CM | POA: Diagnosis present

## 2022-09-13 DIAGNOSIS — N250 Renal osteodystrophy: Secondary | ICD-10-CM | POA: Diagnosis not present

## 2022-09-13 DIAGNOSIS — R531 Weakness: Secondary | ICD-10-CM | POA: Diagnosis not present

## 2022-09-13 DIAGNOSIS — Z89012 Acquired absence of left thumb: Secondary | ICD-10-CM

## 2022-09-13 DIAGNOSIS — Z8249 Family history of ischemic heart disease and other diseases of the circulatory system: Secondary | ICD-10-CM | POA: Diagnosis not present

## 2022-09-13 DIAGNOSIS — D631 Anemia in chronic kidney disease: Secondary | ICD-10-CM | POA: Diagnosis not present

## 2022-09-13 DIAGNOSIS — I700 Atherosclerosis of aorta: Secondary | ICD-10-CM | POA: Diagnosis not present

## 2022-09-13 DIAGNOSIS — I5030 Unspecified diastolic (congestive) heart failure: Secondary | ICD-10-CM | POA: Diagnosis present

## 2022-09-13 DIAGNOSIS — R109 Unspecified abdominal pain: Secondary | ICD-10-CM | POA: Diagnosis not present

## 2022-09-13 DIAGNOSIS — R404 Transient alteration of awareness: Secondary | ICD-10-CM | POA: Diagnosis not present

## 2022-09-13 DIAGNOSIS — R42 Dizziness and giddiness: Secondary | ICD-10-CM | POA: Diagnosis not present

## 2022-09-13 DIAGNOSIS — M898X9 Other specified disorders of bone, unspecified site: Secondary | ICD-10-CM | POA: Diagnosis present

## 2022-09-13 DIAGNOSIS — K811 Chronic cholecystitis: Secondary | ICD-10-CM | POA: Diagnosis present

## 2022-09-13 LAB — CBC WITH DIFFERENTIAL/PLATELET
Abs Immature Granulocytes: 0.05 10*3/uL (ref 0.00–0.07)
Basophils Absolute: 0 10*3/uL (ref 0.0–0.1)
Basophils Relative: 1 %
Eosinophils Absolute: 0.3 10*3/uL (ref 0.0–0.5)
Eosinophils Relative: 4 %
HCT: 23 % — ABNORMAL LOW (ref 39.0–52.0)
Hemoglobin: 7.2 g/dL — ABNORMAL LOW (ref 13.0–17.0)
Immature Granulocytes: 1 %
Lymphocytes Relative: 18 %
Lymphs Abs: 1.5 10*3/uL (ref 0.7–4.0)
MCH: 31.4 pg (ref 26.0–34.0)
MCHC: 31.3 g/dL (ref 30.0–36.0)
MCV: 100.4 fL — ABNORMAL HIGH (ref 80.0–100.0)
Monocytes Absolute: 0.7 10*3/uL (ref 0.1–1.0)
Monocytes Relative: 9 %
Neutro Abs: 5.5 10*3/uL (ref 1.7–7.7)
Neutrophils Relative %: 67 %
Platelets: 234 10*3/uL (ref 150–400)
RBC: 2.29 MIL/uL — ABNORMAL LOW (ref 4.22–5.81)
RDW: 15.9 % — ABNORMAL HIGH (ref 11.5–15.5)
WBC: 8.1 10*3/uL (ref 4.0–10.5)
nRBC: 0 % (ref 0.0–0.2)

## 2022-09-13 LAB — COMPREHENSIVE METABOLIC PANEL
ALT: 13 U/L (ref 0–44)
AST: 22 U/L (ref 15–41)
Albumin: 2.7 g/dL — ABNORMAL LOW (ref 3.5–5.0)
Alkaline Phosphatase: 114 U/L (ref 38–126)
Anion gap: 13 (ref 5–15)
BUN: 40 mg/dL — ABNORMAL HIGH (ref 8–23)
CO2: 28 mmol/L (ref 22–32)
Calcium: 9.1 mg/dL (ref 8.9–10.3)
Chloride: 97 mmol/L — ABNORMAL LOW (ref 98–111)
Creatinine, Ser: 5.91 mg/dL — ABNORMAL HIGH (ref 0.61–1.24)
GFR, Estimated: 9 mL/min — ABNORMAL LOW (ref >60)
Glucose, Bld: 176 mg/dL — ABNORMAL HIGH (ref 70–99)
Potassium: 3.6 mmol/L (ref 3.5–5.1)
Sodium: 138 mmol/L (ref 135–145)
Total Bilirubin: 0.9 mg/dL (ref 0.3–1.2)
Total Protein: 7.5 g/dL (ref 6.5–8.1)

## 2022-09-13 LAB — PROTIME-INR
INR: 1.1 (ref 0.8–1.2)
Prothrombin Time: 14 seconds (ref 11.4–15.2)

## 2022-09-13 LAB — PROCALCITONIN: Procalcitonin: 9.2 ng/mL

## 2022-09-13 LAB — PHOSPHORUS: Phosphorus: 4.2 mg/dL (ref 2.5–4.6)

## 2022-09-13 LAB — MAGNESIUM: Magnesium: 2.1 mg/dL (ref 1.7–2.4)

## 2022-09-13 LAB — GLUCOSE, CAPILLARY: Glucose-Capillary: 131 mg/dL — ABNORMAL HIGH (ref 70–99)

## 2022-09-13 MED ORDER — POLYVINYL ALCOHOL 1.4 % OP SOLN: 1.0000 [drp] | Freq: Every day | OPHTHALMIC | Status: DC | PRN

## 2022-09-13 MED ORDER — MIDODRINE HCL 5 MG PO TABS
10.0000 mg | ORAL_TABLET | Freq: Once | ORAL | Status: AC
  Administered 2022-09-13: 10 mg via ORAL
  Filled 2022-09-13: qty 2

## 2022-09-13 MED ORDER — SODIUM CHLORIDE 0.9 % IV SOLN
1.0000 g | Freq: Once | INTRAVENOUS | Status: AC
  Administered 2022-09-13: 1 g via INTRAVENOUS
  Filled 2022-09-13: qty 10

## 2022-09-13 MED ORDER — SODIUM CHLORIDE 0.9 % IV BOLUS
500.0000 mL | Freq: Once | INTRAVENOUS | Status: AC
  Administered 2022-09-13: 500 mL via INTRAVENOUS

## 2022-09-13 MED ORDER — VANCOMYCIN HCL IN DEXTROSE 1-5 GM/200ML-% IV SOLN: 1000.0000 mg | INTRAVENOUS | Status: DC

## 2022-09-13 MED ORDER — SODIUM CHLORIDE 0.9 % IV SOLN: 2.0000 g | INTRAVENOUS | Status: DC

## 2022-09-13 MED ORDER — PRAVASTATIN SODIUM 40 MG PO TABS
40.0000 mg | ORAL_TABLET | Freq: Every evening | ORAL | Status: DC
  Administered 2022-09-13 – 2022-09-15 (×3): 40 mg via ORAL
  Filled 2022-09-13 (×3): qty 1

## 2022-09-13 MED ORDER — ONDANSETRON HCL 4 MG/2ML IJ SOLN
4.0000 mg | Freq: Four times a day (QID) | INTRAMUSCULAR | Status: DC | PRN
  Administered 2022-09-14: 4 mg via INTRAVENOUS
  Filled 2022-09-13: qty 2

## 2022-09-13 MED ORDER — ENOXAPARIN SODIUM 30 MG/0.3ML IJ SOSY
30.0000 mg | PREFILLED_SYRINGE | INTRAMUSCULAR | Status: DC
  Administered 2022-09-13 – 2022-09-14 (×2): 30 mg via SUBCUTANEOUS
  Filled 2022-09-13 (×2): qty 0.3

## 2022-09-13 MED ORDER — HYDROCODONE-ACETAMINOPHEN 5-325 MG PO TABS: 1.0000 | ORAL_TABLET | Freq: Four times a day (QID) | ORAL | Status: DC | PRN

## 2022-09-13 MED ORDER — LINAGLIPTIN 5 MG PO TABS
5.0000 mg | ORAL_TABLET | Freq: Every day | ORAL | Status: DC
  Administered 2022-09-14 – 2022-09-16 (×3): 5 mg via ORAL
  Filled 2022-09-13 (×3): qty 1

## 2022-09-13 MED ORDER — ACARBOSE 25 MG PO TABS
50.0000 mg | ORAL_TABLET | Freq: Three times a day (TID) | ORAL | Status: DC
  Administered 2022-09-14 – 2022-09-15 (×3): 50 mg via ORAL
  Filled 2022-09-13 (×8): qty 1

## 2022-09-13 MED ORDER — INSULIN ASPART 100 UNIT/ML IJ SOLN
0.0000 [IU] | Freq: Three times a day (TID) | INTRAMUSCULAR | Status: DC
  Administered 2022-09-16: 2 [IU] via SUBCUTANEOUS

## 2022-09-13 MED ORDER — CYCLOSPORINE 0.05 % OP EMUL
1.0000 [drp] | Freq: Two times a day (BID) | OPHTHALMIC | Status: DC
  Administered 2022-09-13 – 2022-09-16 (×6): 1 [drp] via OPHTHALMIC
  Filled 2022-09-13 (×5): qty 30

## 2022-09-13 MED ORDER — ASPIRIN 81 MG PO TBEC
81.0000 mg | DELAYED_RELEASE_TABLET | Freq: Every day | ORAL | Status: DC
  Administered 2022-09-14: 81 mg via ORAL
  Filled 2022-09-13: qty 1

## 2022-09-13 MED ORDER — POLYETHYLENE GLYCOL 3350 17 G PO PACK: 17.0000 g | PACK | Freq: Every day | ORAL | Status: DC | PRN

## 2022-09-13 MED ORDER — INSULIN NPH (HUMAN) (ISOPHANE) 100 UNIT/ML ~~LOC~~ SUSP
18.0000 [IU] | Freq: Every day | SUBCUTANEOUS | Status: DC
  Administered 2022-09-14 – 2022-09-15 (×2): 18 [IU] via SUBCUTANEOUS
  Filled 2022-09-13: qty 10

## 2022-09-13 MED ORDER — TRAMADOL HCL 50 MG PO TABS
50.0000 mg | ORAL_TABLET | Freq: Four times a day (QID) | ORAL | Status: DC | PRN
  Administered 2022-09-13 – 2022-09-16 (×7): 50 mg via ORAL
  Filled 2022-09-13 (×7): qty 1

## 2022-09-13 MED ORDER — VANCOMYCIN HCL 2000 MG/400ML IV SOLN
2000.0000 mg | Freq: Once | INTRAVENOUS | Status: AC
  Administered 2022-09-13: 2000 mg via INTRAVENOUS
  Filled 2022-09-13: qty 400

## 2022-09-13 MED ORDER — ONDANSETRON HCL 4 MG PO TABS: 4.0000 mg | ORAL_TABLET | Freq: Four times a day (QID) | ORAL | Status: DC | PRN

## 2022-09-13 MED ORDER — PNEUMOCOCCAL 20-VAL CONJ VACC 0.5 ML IM SUSY
0.5000 mL | PREFILLED_SYRINGE | INTRAMUSCULAR | Status: AC
  Administered 2022-09-14: 0.5 mL via INTRAMUSCULAR
  Filled 2022-09-13: qty 0.5

## 2022-09-13 MED ORDER — INSULIN ASPART 100 UNIT/ML IJ SOLN: 0.0000 [IU] | Freq: Every day | INTRAMUSCULAR | Status: DC

## 2022-09-13 MED ORDER — CHLORHEXIDINE GLUCONATE CLOTH 2 % EX PADS
6.0000 | MEDICATED_PAD | Freq: Every day | CUTANEOUS | Status: DC
  Administered 2022-09-13 – 2022-09-16 (×3): 6 via TOPICAL

## 2022-09-13 MED ORDER — METRONIDAZOLE 500 MG/100ML IV SOLN
500.0000 mg | Freq: Two times a day (BID) | INTRAVENOUS | Status: DC
  Administered 2022-09-13 – 2022-09-14 (×2): 500 mg via INTRAVENOUS
  Filled 2022-09-13 (×2): qty 100

## 2022-09-13 NOTE — ED Notes (Signed)
Unsuccessful IV attempt x2.  Will ask another RN to attempt.  

## 2022-09-13 NOTE — H&P (Signed)
History and Physical    Patient: [name] JJK:[id number] DOB: [birth year] DOA: 09/13/2022 DOS: the patient was seen and examined on 09/13/2022 PCP: Redmond School, MD  Patient coming from: Home  Chief Complaint:  Chief Complaint  Patient presents with   Dizziness   HPI: [name] is a 76 y.o. male with medical history significant of end-stage renal disease on dialysis Monday, Wednesday, Friday.  Coronary artery disease with history of NSTEMI with multiple stents, type 2 diabetes, GERD, hypertension, vertigo.  Patient was admitted in the beginning of the month with acute cholecystitis.  As the patient was a poor surgical candidate, patient had a percutaneous cholecystostomy tube placed and the patient was sent home on antibiotics.  The patient been doing well until today.  The patient sat up and began to feel extremely lightheaded like he was going to pass out.  He came to the hospital for evaluation.  Here, he was found to be significantly hypotensive with blood pressures in the 70s and 80s.  He is normal blood pressures in the 120s.  He was given several fluid boluses, which helped his blood pressure, up to the 80s to 90s.  He has been taking his blood pressure medications.  He denies fevers, chills, nausea, vomiting.  Denies chest pain or shortness of breath or cough.  Review of Systems: As mentioned in the history of present illness. All other systems reviewed and are negative. Past Medical History:  Diagnosis Date   Anginal pain ([place])    Anxiety    Benign prostatic hypertrophy    Nocturia   CAD (coronary artery disease)    a.  NSTEMI 10/2012 s/p DES to LAD & DES to 1st diagonal with residual diffuse nonobstructive dz in LCx/RCA    Chronic kidney disease, stage 3, mod decreased GFR (HCC)    Creatinine of 1.5 in 03/2009, STAGE 4 now   Degenerative joint disease    Depression    hx of   Diabetes mellitus, type II ([place]) 37/16/9678   Diastolic heart failure    Pedal edema, "a  long time ago"   Erectile dysfunction    Flu 2013   hx of   GERD (gastroesophageal reflux disease)    Gout    Hemorrhoids    High cholesterol    Hypertension    Exercise induced   Myocardial infarction ([place])    Neuropathy    bilateral legs   NSVT (nonsustained ventricular tachycardia) ([place])    Exercise induced   Obesity    Tobacco abuse, 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, quit 1994   Tubular adenoma 2014   Vertigo    everyday   Past Surgical History:  Procedure Laterality Date   BACK SURGERY  2007   x2   BIOPSY  02/05/2021   Procedure: BIOPSY;  Surgeon: Daneil Dolin, MD;  [facility];  Service: Endoscopy;;   CARDIAC CATHETERIZATION     with stent placement   COLONOSCOPY WITH ESOPHAGOGASTRODUODENOSCOPY (EGD) N/A 11/23/2013   Dr. Oneida Alar- TCS= left sided colitis and mild proctitis likely due to ischemic colitis, moderate internal hemorrhoids, small nodule in the rectum, bx=tubular adenoma, EGD=-mild non-erosive gastritis   COLONOSCOPY WITH PROPOFOL N/A 07/24/2020   hemorrhoids,  rectosigmoid lipoma, small adenoma removed.   CORONARY ANGIOPLASTY     ESOPHAGOGASTRODUODENOSCOPY (EGD) WITH PROPOFOL N/A 02/05/2021   Normal esophagus, erythematous mucosa in entire stomach, no obvious ulcer, s/p biopsy. Normal duodenum. Negative H.pylori.    HERNIA REPAIR  INSERTION OF DIALYSIS CATHETER N/A 08/06/2022   Procedure: INSERTION OF DIALYSIS CATHETER;  Surgeon: Rosetta Posner, MD;  Location: [facility];  Service: Vascular;  Laterality: N/A;   IR PERC CHOLECYSTOSTOMY  08/20/2022   JOINT REPLACEMENT     LEFT HEART CATHETERIZATION WITH CORONARY ANGIOGRAM N/A 11/16/2012   Procedure: LEFT HEART CATHETERIZATION WITH CORONARY ANGIOGRAM;  Surgeon: Sherren Mocha, MD;  Location: [facility] CATH LAB;  Service: Cardiovascular;  Laterality: N/A;   PERCUTANEOUS CORONARY STENT INTERVENTION (PCI-S)  11/16/2012   Procedure: PERCUTANEOUS CORONARY STENT INTERVENTION (PCI-S);  Surgeon: Sherren Mocha, MD;   Location: [facility] CATH LAB;  Service: Cardiovascular;;   POLYPECTOMY  07/24/2020   Procedure: POLYPECTOMY;  Surgeon: Daneil Dolin, MD;  Location: [facility];  Service: Endoscopy;;   SPINAL CORD STIMULATOR INSERTION N/A 06/10/2022   Procedure: Spinal cord stimulator placement;  Surgeon: Newman Pies, MD;  Location: [place];  Service: Neurosurgery;  Laterality: N/A;  RM 19/ to follow   THUMB AMPUTATION Left    50 years ago   TOTAL KNEE ARTHROPLASTY Right 11/15/2013   Procedure: RIGHT TOTAL KNEE ARTHROPLASTY;  Surgeon: Mauri Pole, MD;  Location: [facility];  Service: Orthopedics;  Laterality: Right;   TOTAL KNEE ARTHROPLASTY Left 03/07/2015   Procedure: LEFT TOTAL KNEE ARTHROPLASTY;  Surgeon: Paralee Cancel, MD;  Location: [facility];  Service: Orthopedics;  Laterality: Left;   Social History:  reports that he quit smoking about 29 years ago. His smoking use included cigarettes. He started smoking about 57 years ago. He has a 30.00 pack-year smoking history. He has never used smokeless tobacco. He reports that he does not drink alcohol and does not use drugs.  Allergies  Allergen Reactions   Metformin Nausea And Vomiting   Niacin    Niacin And Related Other (See Comments)    Reaction: flushing and burning side effects even with extended release    Family History  Problem Relation Age of Onset   Hypertension Other    Cancer Other    Heart disease Other    Diabetes Other    Hypertension Mother    Cancer Father        brain   CAD Brother    Colon cancer Neg Hx     Prior to Admission medications   Medication Sig Start Date End Date Taking? Authorizing Provider  acarbose (PRECOSE) 50 MG tablet Take 50 mg by mouth in the morning, at noon, and at bedtime.  10/20/17  Yes [provider]  acetaminophen (TYLENOL) 500 MG tablet Take 2 tablets (1,000 mg total) by mouth every 6 (six) hours. 08/10/22  Yes Shahmehdi, Seyed A, MD  allopurinol (ZYLOPRIM) 100 MG tablet Take 100 mg by mouth  daily. 08/17/20  Yes [provider]  amLODipine (NORVASC) 10 MG tablet Take 10 mg by mouth every evening.    Yes [provider]  ascorbic acid (VITAMIN C) 500 MG tablet Take 500 mg by mouth daily.   Yes [provider]  aspirin EC 81 MG tablet Take 1 tablet (81 mg total) by mouth daily. 09/14/19  Yes Herminio Commons, MD  B Complex-C-Zn-Folic Acid (DIALYVITE 540-GQQP 15) 0.8 MG TABS Take 1 tablet by mouth daily. 09/04/22  Yes [provider]  carboxymethylcellulose (REFRESH PLUS) 0.5 % SOLN Place 1 drop into both eyes daily as needed (dry eyes).   Yes [provider]  carvedilol (COREG) 6.25 MG tablet Take 6.25 mg by mouth 2 (two) times daily with a meal. 04/18/22  04/18/23 Yes [provider]  hydrALAZINE (APRESOLINE) 100 MG tablet Take 1 tablet (100 mg total) by mouth 3 (three) times daily. 08/23/22  Yes Little Ishikawa, MD  insulin NPH Human (NOVOLIN N) 100 UNIT/ML injection Inject 0.18 mLs (18 Units total) into the skin every morning. Ands syringes 1/day Patient taking differently: Inject 18 Units into the skin daily. 06/27/21  Yes Renato Shin, MD  isosorbide mononitrate (IMDUR) 60 MG 24 hr tablet Take 1 tablet (60 mg total) by mouth 2 (two) times daily. 06/26/22 06/21/23 Yes BranchAlphonse Guild, MD  meclizine (ANTIVERT) 12.5 MG tablet Take 12.5 mg by mouth 3 (three) times daily as needed for dizziness.   Yes [provider]  melatonin 5 MG TABS Take 5 mg by mouth at bedtime.   Yes [provider]  nitroGLYCERIN (NITROSTAT) 0.4 MG SL tablet Place 1 tablet (0.4 mg total) under the tongue every 5 (five) minutes as needed for chest pain. PLACE ONE TABLET UNDER THE TONGUE EVERY 5 MINUTES FOR 3 DOSES AS NEEDED Strength: 0.4 mg 02/07/22  Yes Branch, Alphonse Guild, MD  polyethylene glycol (MIRALAX / GLYCOLAX) 17 g packet Take 17 g by mouth daily. 08/11/22  Yes Shahmehdi, Seyed A, MD  pravastatin (PRAVACHOL) 40 MG tablet Take 1 tablet (40  mg total) by mouth every evening. 07/31/21 02/09/28 Yes Imogene Burn, PA-C  promethazine (PHENERGAN) 25 MG suppository Place 25 mg rectally every 12 (twelve) hours as needed for nausea or vomiting.   Yes [provider]  RESTASIS 0.05 % ophthalmic emulsion Place 1 drop into both eyes 2 (two) times daily. 06/19/22  Yes [provider]  senna (SENOKOT) 8.6 MG TABS tablet Take 2 tablets (17.2 mg total) by mouth 2 (two) times daily. 08/10/22  Yes Shahmehdi, Valeria Batman, MD  simethicone (MYLICON) 80 MG chewable tablet Chew 80 mg by mouth every 6 (six) hours as needed for flatulence.   Yes [provider]  sitaGLIPtin (JANUVIA) 25 MG tablet Take 25 mg by mouth daily.   Yes [provider]  traMADol (ULTRAM) 50 MG tablet Take 50 mg by mouth 4 (four) times daily as needed for pain. 08/14/22  Yes [provider]  Zinc 50 MG TABS Take 1 tablet by mouth daily.   Yes [provider]  amoxicillin-clavulanate (AUGMENTIN) 500-125 MG tablet Take 1 tablet (500 mg total) by mouth 2 (two) times daily. Patient not taking: Reported on 09/13/2022 08/23/22   Little Ishikawa, MD  calcitRIOL (ROCALTROL) 0.25 MCG capsule Take 0.25 mcg by mouth every Sunday.  12/13/16   [provider]  cloNIDine (CATAPRES) 0.1 MG tablet Take 0.1 mg by mouth 3 (three) times daily. 02/15/20   [provider]  epoetin alfa (EPOGEN) 3000 UNIT/ML injection Inject into the skin. 07/03/22   [provider]  TRUE METRIX BLOOD GLUCOSE TEST test strip USE TO TEST BLOOD SUGAR UP TO TWICE DAILY OR AS DIRECTED 08/31/21   Renato Shin, MD    Physical Exam: Vitals:   09/13/22 1500 09/13/22 1530 09/13/22 1600 09/13/22 1630  BP: (!) 78/62 (!) 89/51 (!) 89/47 (!) 89/56  Pulse: (!) 38 (!) 44 (!) 39 (!) 42  Resp: 13 14 17 17   Temp:      TempSrc:      SpO2: 97% 100% 97% 100%  Weight:      Height:       General: Elderly male. Awake and alert and oriented x3. No acute  cardiopulmonary distress.  HEENT:  Normocephalic atraumatic.  Right and left ears normal in appearance.  Pupils equal, round, reactive to light. Extraocular muscles are intact. Sclerae anicteric and noninjected.  Moist mucosal membranes. No mucosal lesions.  Neck: Neck supple without lymphadenopathy. No carotid bruits. No masses palpated.  Cardiovascular: Regular rate with normal S1-S2 sounds. No murmurs, rubs, gallops auscultated. No JVD.  Respiratory: Good respiratory effort with no wheezes, rales, rhonchi. Lungs clear to auscultation bilaterally.  No accessory muscle use. Abdomen: Soft, nontender, nondistended.  There is a percutaneous cholecystostomy tube present.  The skin surrounding the tube is nonerythematous.  Active bowel sounds. No masses or hepatosplenomegaly  Skin: No rashes, lesions, or ulcerations.  Dry, warm to touch. 2+ dorsalis pedis and radial pulses. Musculoskeletal: No calf or leg pain. All major joints not erythematous nontender.  No upper or lower joint deformation.  Good ROM.  No contractures  Psychiatric: Intact judgment and insight. Pleasant and cooperative. Neurologic: No focal neurological deficits. Strength is 5/5 and symmetric in upper and lower extremities.  Cranial nerves II through XII are grossly intact.  Data Reviewed: Results for orders placed or performed during the hospital encounter of 09/13/22 (from the past 24 hour(s))  Comprehensive metabolic panel     Status: Abnormal   Collection Time: 09/13/22 10:29 AM  Result Value Ref Range   Sodium 138 135 - 145 mmol/L   Potassium 3.6 3.5 - 5.1 mmol/L   Chloride 97 (L) 98 - 111 mmol/L   CO2 28 22 - 32 mmol/L   Glucose, Bld 176 (H) 70 - 99 mg/dL   BUN 40 (H) 8 - 23 mg/dL   Creatinine, Ser 5.91 (H) 0.61 - 1.24 mg/dL   Calcium 9.1 8.9 - 10.3 mg/dL   Total Protein 7.5 6.5 - 8.1 g/dL   Albumin 2.7 (L) 3.5 - 5.0 g/dL   AST 22 15 - 41 U/L   ALT 13 0 - 44 U/L   Alkaline Phosphatase 114 38 - 126 U/L   Total Bilirubin  0.9 0.3 - 1.2 mg/dL   GFR, Estimated 9 (L) >60 mL/min   Anion gap 13 5 - 15  CBC with Differential     Status: Abnormal   Collection Time: 09/13/22 10:29 AM  Result Value Ref Range   WBC 8.1 4.0 - 10.5 K/uL   RBC 2.29 (L) 4.22 - 5.81 MIL/uL   Hemoglobin 7.2 (L) 13.0 - 17.0 g/dL   HCT 23.0 (L) 39.0 - 52.0 %   MCV 100.4 (H) 80.0 - 100.0 fL   MCH 31.4 26.0 - 34.0 pg   MCHC 31.3 30.0 - 36.0 g/dL   RDW 15.9 (H) 11.5 - 15.5 %   Platelets 234 150 - 400 K/uL   nRBC 0.0 0.0 - 0.2 %   Neutrophils Relative % 67 %   Neutro Abs 5.5 1.7 - 7.7 K/uL   Lymphocytes Relative 18 %   Lymphs Abs 1.5 0.7 - 4.0 K/uL   Monocytes Relative 9 %   Monocytes Absolute 0.7 0.1 - 1.0 K/uL   Eosinophils Relative 4 %   Eosinophils Absolute 0.3 0.0 - 0.5 K/uL   Basophils Relative 1 %   Basophils Absolute 0.0 0.0 - 0.1 K/uL   Immature Granulocytes 1 %   Abs Immature Granulocytes 0.05 0.00 - 0.07 K/uL  Protime-INR     Status: None   Collection Time: 09/13/22 10:29 AM  Result Value Ref Range   Prothrombin Time 14.0 11.4 - 15.2 seconds   INR 1.1 0.8 - 1.2  Magnesium     Status: None   Collection Time: 09/13/22 10:29 AM  Result Value Ref Range   Magnesium 2.1 1.7 - 2.4 mg/dL  Phosphorus     Status: None   Collection Time: 09/13/22 10:29 AM  Result Value Ref Range   Phosphorus 4.2 2.5 - 4.6 mg/dL   CT ABDOMEN PELVIS WO CONTRAST  Result Date: 09/13/2022 CLINICAL DATA:  Postoperative abdominal pain. Dizziness. On dialysis for 6-7 weeks. EXAM: CT ABDOMEN AND PELVIS WITHOUT CONTRAST TECHNIQUE: Multidetector CT imaging of the abdomen and pelvis was performed following the standard protocol without IV contrast. RADIATION DOSE REDUCTION: This exam was performed according to the departmental dose-optimization program which includes automated exposure control, adjustment of the mA and/or kV according to patient size and/or use of iterative reconstruction technique. COMPARISON:  CT abdomen dated 08/19/2022. FINDINGS: Lower  chest: Mild bibasilar atelectasis. Hepatobiliary: Percutaneous cholecystostomy tube is now in place. Gallbladder is appropriately decompressed. Mild residual pericholecystic inflammation. No evidence of pericholecystic abscess collection or other circumscribed fluid collection in the surrounding RIGHT upper abdomen. Scattered hypodense lesions within the bilateral liver lobes, difficult to characterize without intravascular contrast, but not significantly changed compared to an earlier CT of 12/14/2016 suggesting benignity, presumably small cysts and/or hemangiomas. No follow-up imaging is needed for this benign finding. No new or suspicious findings within the liver. No evidence of bile duct dilatation. Pancreas: Partially infiltrated with fat but otherwise unremarkable. Spleen: Normal in size without focal abnormality. Adrenals/Urinary Tract: Adrenal glands appear normal. Kidneys are unremarkable without suspicious mass, stone or hydronephrosis. Bladder is unremarkable. Stomach/Bowel: No dilated large or small bowel loops. Fairly large amount of stool within the colon. No evidence of focal acute bowel wall inflammation. Stomach is unremarkable, partially decompressed. Vascular/Lymphatic: Aortic atherosclerosis. No abdominal aortic aneurysm. No enlarged lymph nodes are identified within the abdomen or pelvis. Reproductive: Prostate is mildly enlarged, unchanged. Other: No free fluid or abscess collection is seen within the abdomen or pelvis. No free intraperitoneal air. Musculoskeletal: No acute-appearing osseous abnormality. Fixation hardware within the lumbar spine appears appropriately positioned. Superficial soft tissues of the abdomen and pelvis are unremarkable. IMPRESSION: 1. Percutaneous cholecystostomy tube is now in place. Gallbladder is appropriately decompressed. Mild residual pericholecystic inflammation. No evidence of pericholecystic abscess collection, hematoma or other complicating features. 2.  Fairly large amount of stool within the colon (constipation?). 3. No free fluid or abscess collection is seen within the abdomen or pelvis. No free intraperitoneal air. 4. Mildly enlarged prostate gland. Aortic Atherosclerosis (ICD10-I70.0). Electronically Signed   By: Franki Cabot M.D.   On: 09/13/2022 14:47   DG Chest 1 View  Result Date: 09/13/2022 CLINICAL DATA:  Weakness, dizziness EXAM: CHEST  1 VIEW COMPARISON:  Radiograph 08/19/2022 FINDINGS: Right neck catheter tip overlies the right atrium. Unchanged mildly enlarged cardiac silhouette. There is no focal airspace consolidation. There is no pleural effusion. No pneumothorax. A spinal cord stimulator overlies the lower thoracic spine. IMPRESSION: No evidence of acute cardiopulmonary disease. Electronically Signed   By: Maurine Simmering M.D.   On: 09/13/2022 11:06     Assessment and Plan: No notes have been filed under this hospital service. Service: Hospitalist  Active Problems:   ESRD (end stage renal disease) ([place])   (HFpEF) heart failure with preserved ejection fraction (HCC)   GERD (gastroesophageal reflux disease)   Diabetes mellitus type 2 in obese (HCC)   Acute cholecystitis   Sepsis ([place])  Hypotension with consideration of sepsis Hold antihypertensives Continue antibiotics with coverage  for intra-abdominal pathologies: Vancomycin, Rocephin, Flagyl. Blood cultures obtained by EDP Check procalcitonin Recheck CBC in the morning If the patient needs pressor support, would use peripheral IV for the present.  Acute cholecystitis status post percutaneous cholecystostomy placement No purulent drainage from cholecystostomy tube I did consult with Dr. Constance Haw with general surgery.  She will see him in the morning and is available as needed for any emergency. At the present, the CT scan shows some stranding of the gallbladder, consistent with residual inflammation.  There is no acute abscess or hematoma present. End-stage renal disease  on dialysis I discussed patient with Dr. Moshe Cipro of nephrology.  We will need to see how the patient is doing tomorrow with holding antihypertensives.  If the patient is unable to tolerate dialysis due to hypotension, may need to be transferred to [facility] for CRRT Heart failure with preserved EF Appears a little dehydrated at the present. Diabetes Continue insulin and SGLT2 Sliding scale insulin   Advance Care Planning:   Code Status: Prior Full code  Consults: Nephrology, gen surgery  Family Communication: none  Severity of Illness: The appropriate patient status for this patient is INPATIENT. Inpatient status is judged to be reasonable and necessary in order to provide the required intensity of service to ensure the patient's safety. The patient's presenting symptoms, physical exam findings, and initial radiographic and laboratory data in the context of their chronic comorbidities is felt to place them at high risk for further clinical deterioration. Furthermore, it is not anticipated that the patient will be medically stable for discharge from the hospital within 2 midnights of admission.   * I certify that at the point of admission it is my clinical judgment that the patient will require inpatient hospital care spanning beyond 2 midnights from the point of admission due to high intensity of service, high risk for further deterioration and high frequency of surveillance required.*  Author: Truett Mainland, DO 09/13/2022 5:08 PM  For on call review [email or web address].

## 2022-09-13 NOTE — ED Triage Notes (Signed)
Pt c/o dizziness this morning upon waking. Pt has a hx of dizziness and takes meclizine 12.53m . Pt took one after his spell this morning. Pt still c/o dizziness.  [place]

## 2022-09-13 NOTE — ED Notes (Signed)
ED Provider at bedside. 

## 2022-09-13 NOTE — ED Notes (Signed)
Phlebotomy at bedside.

## 2022-09-13 NOTE — ED Notes (Signed)
Pt's brother voicing concerns because the Pt lives by himself and is unable to complete ADLs.  Pt was in a rehab, but "his time ran out."  Pt was unable to afford self-pay.  Sts he gets physical therapy twice a week, but no further assistance.

## 2022-09-13 NOTE — ED Notes (Signed)
Pt reports he is feeling "much, much better."

## 2022-09-13 NOTE — Progress Notes (Signed)
Pharmacy Antibiotic Note  [name] is a 76 y.o. male admitted on 09/13/2022 with cellulitis. ESRD MWF.  Pharmacy has been consulted for vancomycin dosing.  Plan: Vancomycin 2000 mg IV x 1 dose. Vancomycin 1000 mg IV every MWF w/ HD. Monitor labs, c/s, and vanco level as indicated.  Height: 6' 2"  (188 cm) Weight: 99.8 kg (220 lb) IBW/kg (Calculated) : 82.2  Temp (24hrs), Avg:97.9 F (36.6 C), Min:97.9 F (36.6 C), Max:97.9 F (36.6 C)  Recent Labs  Lab 09/13/22 1029  WBC 8.1  CREATININE 5.91*    Estimated Creatinine Clearance: 13.2 mL/min (A) (by C-G formula based on SCr of 5.91 mg/dL (H)).    Allergies  Allergen Reactions   Metformin Nausea And Vomiting   Niacin    Niacin And Related Other (See Comments)    Reaction: flushing and burning side effects even with extended release    Antimicrobials this admission: Vanco 9/22 >> CTX 9/22 >>  Microbiology results: 9/22 BCx: pending   Thank you for allowing pharmacy to be a part of this patient's care.  Ramond Craver 09/13/2022 12:20 PM

## 2022-09-13 NOTE — ED Notes (Signed)
Pt leaving unit with RN to be transported to floor. Pt in no acute distress, denies pain or dizziness at this time.

## 2022-09-13 NOTE — ED Provider Notes (Signed)
[facility] EMERGENCY DEPARTMENT Provider Note  CSN: [id number] Arrival date & time: 09/13/22 1157  Chief Complaint(s) Dizziness  HPI [name] is a 76 y.o. male with history of coronary artery disease, end-stage renal disease, diabetes, GERD presenting to the emergency department with weakness.  Patient reports that he woke up this morning and felt "dizzy ", reports a history of vertigo, tried to take meclizine without improvement and called EMS.  Patient denies other symptoms such as fevers or chills, chest pain, cough, runny nose, sore throat, abdominal pain, reports he no longer urinates.  He reports that the catheter in his abdomen has been draining fluid normally, he reports he is taking his prescribed antibiotics.  Patient's brother called and reported that the patient lives by himself, having difficulty with self-care due to frailty   Past Medical History Past Medical History:  Diagnosis Date   Anginal pain ([place])    Anxiety    Benign prostatic hypertrophy    Nocturia   CAD (coronary artery disease)    a.  NSTEMI 10/2012 s/p DES to LAD & DES to 1st diagonal with residual diffuse nonobstructive dz in LCx/RCA    Chronic kidney disease, stage 3, mod decreased GFR (HCC)    Creatinine of 1.5 in 03/2009, STAGE 4 now   Degenerative joint disease    Depression    hx of   Diabetes mellitus, type II ([place]) 26/20/3559   Diastolic heart failure    Pedal edema, "a long time ago"   Erectile dysfunction    Flu 2013   hx of   GERD (gastroesophageal reflux disease)    Gout    Hemorrhoids    High cholesterol    Hypertension    Exercise induced   Myocardial infarction ([place])    Neuropathy    bilateral legs   NSVT (nonsustained ventricular tachycardia) ([place])    Exercise induced   Obesity    Tobacco abuse, 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, quit 1994   Tubular adenoma 2014   Vertigo    everyday   Patient Active Problem List   Diagnosis Date Noted   Sepsis ([place]) 09/13/2022    Acute cholecystitis 08/19/2022   ESRD (end stage renal disease) ([place]) 08/07/2022   HFrEF (heart failure with reduced ejection fraction) (HCC)    (HFpEF) heart failure with preserved ejection fraction ([place]) 07/29/2022   Hypokalemia 07/29/2022   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   Acute respiratory failure with hypoxia ([place])    B12 deficiency 07/18/2022   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/2022   Diabetic neuropathy ([place]) 05/10/2022   Elevated troponin 05/10/2022   Type 2 diabetes mellitus with hyperglycemia ([place]) 05/10/2022   Other intervertebral disc degeneration, lumbar region 02/06/2022   Chronic gout, unspecified, 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ophi) 02/06/2022   Body mass index (BMI) 35.0-35.9, adult 02/06/2022   Obesity 02/06/2022   Primary generalized (osteo)arthritis 02/06/2022   Chest pain 02/06/2022   Lumbago with sciatica, right side 08/28/2021   Weakness of both lower extremities 08/28/2021   Body mass index (BMI) 33.0-33.9, adult 08/28/2021   Constipation 02/15/2021   Abdominal pain 01/02/2021   Diabetes mellitus ([place]) 11/09/2020   Hypertensive disorder 11/09/2020   Hypercholesterolemia 11/09/2020   H/O adenomatous polyp of colon 05/05/2020   Iron deficiency anemia 05/05/2020   Chronic kidney disease, stage IV (severe) ([place]) 12/14/2019   Acute gouty arthritis 10/25/2019   Leukocytosis 04/02/2019   Cellulitis of right hand 04/01/2019   Osteoarthritis of ankle and foot 02/08/2019  CHF (congestive heart failure) ([place]) 07/05/2015   Acute congestive heart failure ([place]) 07/04/2015   Acute on chronic diastolic congestive heart failure ([place]) 07/04/2015   Mixed hyperlipidemia 07/04/2015   Anxiety state 07/04/2015   GERD (gastroesophageal reflux disease) 07/04/2015   Heart block 07/04/2015   Gastroesophageal reflux disease 41/32/4401   Diastolic CHF, chronic (HCC) 03/11/2015   Chronic diastolic heart failure ([place]) 03/11/2015   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   Knee stiffness 01/13/2014   Gout  12/11/2013   Expected blood loss anemia 11/17/2013   S/P right TKA 11/15/2013   Anemia, normocytic normochromic 01/12/2013   Acute kidney injury superimposed on chronic kidney disease ([place]) 12/10/2012   Diabetes mellitus type 2 in obese ([place]) 12/10/2012   CAD S/P percutaneous coronary angioplasty 11/24/2012   Overweight(278.02) 11/17/2012   Chronic kidney disease, stage 3, cardiorenal syndrome 08/23/2011   Chronic kidney disease, stage 3 unspecified ([place]) 08/23/2011   Essential hypertension    Home Medication(s) Prior to Admission medications   Medication Sig Start Date End Date Taking? Authorizing Provider  acarbose (PRECOSE) 50 MG tablet Take 50 mg by mouth in the morning, at noon, and at bedtime.  10/20/17   [provider]  acetaminophen (TYLENOL) 500 MG tablet Take 2 tablets (1,000 mg total) by mouth every 6 (six) hours. 08/10/22   Shahmehdi, Valeria Batman, MD  allopurinol (ZYLOPRIM) 100 MG tablet Take 100 mg by mouth daily. 08/17/20   [provider]  amLODipine (NORVASC) 10 MG tablet Take 10 mg by mouth every evening.     [provider]  amoxicillin-clavulanate (AUGMENTIN) 500-125 MG tablet Take 1 tablet (500 mg total) by mouth 2 (two) times daily. 08/23/22   Little Ishikawa, MD  aspirin EC 81 MG tablet Take 1 tablet (81 mg total) by mouth daily. 09/14/19   Herminio Commons, MD  calcitRIOL (ROCALTROL) 0.25 MCG capsule Take 0.25 mcg by mouth every Sunday.  12/13/16   [provider]  carboxymethylcellulose (REFRESH PLUS) 0.5 % SOLN Place 1 drop into both eyes daily as needed (dry eyes).    [provider]  carvedilol (COREG) 6.25 MG tablet Take 6.25 mg by mouth 2 (two) times daily with a meal. 04/18/22 04/18/23  [provider]  cholecalciferol (VITAMIN D) 1000 units tablet Take 1,000 Units daily by mouth.    [provider]  cloNIDine (CATAPRES) 0.1 MG tablet Take 0.1 mg by mouth 3 (three) times daily. 02/15/20   [provider]  epoetin alfa (EPOGEN) 3000 UNIT/ML injection Inject into the skin. 07/03/22   [provider]  hydrALAZINE (APRESOLINE) 100 MG tablet Take 1 tablet (100 mg total) by mouth 3 (three) times daily. 08/23/22   Little Ishikawa, MD  insulin NPH Human (NOVOLIN N) 100 UNIT/ML injection Inject 0.18 mLs (18 Units total) into the skin every morning. Ands syringes 1/day Patient taking differently: Inject 18 Units into the skin daily. 06/27/21   Renato Shin, MD  isosorbide mononitrate (IMDUR) 60 MG 24 hr tablet Take 1 tablet (60 mg total) by mouth 2 (two) times daily. 06/26/22 06/21/23  Arnoldo Lenis, MD  nitroGLYCERIN (NITROSTAT) 0.4 MG SL tablet Place 1 tablet (0.4 mg total) under the tongue every 5 (five) minutes as needed for chest pain. PLACE ONE TABLET UNDER THE TONGUE EVERY 5 MINUTES FOR 3 DOSES AS NEEDED Strength: 0.4 mg 02/07/22   Arnoldo Lenis, MD  polyethylene glycol (MIRALAX / GLYCOLAX) 17 g packet Take 17 g by mouth daily. 08/11/22  Shahmehdi, Valeria Batman, MD  pravastatin (PRAVACHOL) 40 MG tablet Take 1 tablet (40 mg total) by mouth every evening. 07/31/21 02/09/28  Imogene Burn, PA-C  RESTASIS 0.05 % ophthalmic emulsion Place 1 drop into both eyes 2 (two) times daily. 06/19/22   [provider]  senna (SENOKOT) 8.6 MG TABS tablet Take 2 tablets (17.2 mg total) by mouth 2 (two) times daily. 08/10/22   Shahmehdi, Valeria Batman, MD  sitaGLIPtin (JANUVIA) 25 MG tablet Take 25 mg by mouth daily.    [provider]  traMADol (ULTRAM) 50 MG tablet Take 50 mg by mouth 4 (four) times daily as needed for pain. 08/14/22   [provider]  TRUE METRIX BLOOD GLUCOSE TEST test strip USE TO TEST BLOOD SUGAR UP TO TWICE DAILY OR AS DIRECTED 08/31/21   Renato Shin, MD                                                                                                                                    Past Surgical History Past Surgical History:  Procedure Laterality Date    BACK SURGERY  2007   x2   BIOPSY  02/05/2021   Procedure: BIOPSY;  Surgeon: Daneil Dolin, MD;  Location: [facility];  Service: Endoscopy;;   CARDIAC CATHETERIZATION     with stent placement   COLONOSCOPY WITH ESOPHAGOGASTRODUODENOSCOPY (EGD) N/A 11/23/2013   Dr. Oneida Alar- TCS= left sided colitis and mild proctitis likely due to ischemic colitis, moderate internal hemorrhoids, small nodule in the rectum, bx=tubular adenoma, EGD=-mild non-erosive gastritis   COLONOSCOPY WITH PROPOFOL N/A 07/24/2020   hemorrhoids,  rectosigmoid lipoma, small adenoma removed.   CORONARY ANGIOPLASTY     ESOPHAGOGASTRODUODENOSCOPY (EGD) WITH PROPOFOL N/A 02/05/2021   Normal esophagus, erythematous mucosa in entire stomach, no obvious ulcer, s/p biopsy. Normal duodenum. Negative H.pylori.    HERNIA REPAIR     INSERTION OF DIALYSIS CATHETER N/A 08/06/2022   Procedure: INSERTION OF DIALYSIS CATHETER;  Surgeon: Rosetta Posner, MD;  Location: [facility] ORS;  Service: Vascular;  Laterality: N/A;   IR PERC CHOLECYSTOSTOMY  08/20/2022   JOINT REPLACEMENT     LEFT HEART CATHETERIZATION WITH CORONARY ANGIOGRAM N/A 11/16/2012   Procedure: LEFT HEART CATHETERIZATION WITH CORONARY ANGIOGRAM;  Surgeon: Sherren Mocha, MD;  Location: [facility] CATH LAB;  Service: Cardiovascular;  Laterality: N/A;   PERCUTANEOUS CORONARY STENT INTERVENTION (PCI-S)  11/16/2012   Procedure: PERCUTANEOUS CORONARY STENT INTERVENTION (PCI-S);  Surgeon: Sherren Mocha, MD;  Location: [facility] CATH LAB;  Service: Cardiovascular;;   POLYPECTOMY  07/24/2020   Procedure: POLYPECTOMY;  Surgeon: Daneil Dolin, MD;  Location: [facility];  Service: Endoscopy;;   SPINAL CORD STIMULATOR INSERTION N/A 06/10/2022   Procedure: Spinal cord stimulator placement;  Surgeon: Newman Pies, MD;  Location: [place];  Service: Neurosurgery;  Laterality: N/A;  RM 19/ to follow   THUMB AMPUTATION Left    50 years ago  TOTAL KNEE ARTHROPLASTY Right 11/15/2013   Procedure: RIGHT  TOTAL KNEE ARTHROPLASTY;  Surgeon: Mauri Pole, MD;  Location: [facility] ORS;  Service: Orthopedics;  Laterality: Right;   TOTAL KNEE ARTHROPLASTY Left 03/07/2015   Procedure: LEFT TOTAL KNEE ARTHROPLASTY;  Surgeon: Paralee Cancel, MD;  Location: [facility] ORS;  Service: Orthopedics;  Laterality: Left;   Family History Family History  Problem Relation Age of Onset   Hypertension Other    Cancer Other    Heart disease Other    Diabetes Other    Hypertension Mother    Cancer Father        brain   CAD Brother    Colon cancer Neg Hx     Social History Social History   Tobacco Use   Smoking status: Former    Packs/day: 1.00    Years: 30.00    Total pack years: 30.00    Types: Cigarettes    Start date: 01/22/1965    Quit date: 12/23/1992    Years since quitting: 29.7   Smokeless tobacco: Never  Vaping Use   Vaping Use: Never used  Substance Use Topics   Alcohol use: No    Alcohol/week: 0.0 standard drinks of alcohol   Drug use: No   Allergies Metformin, Niacin, and Niacin and related  Review of Systems Review of Systems  All other systems reviewed and are negative.   Physical Exam Vital Signs  I have reviewed the triage vital signs BP (!) 89/51   Pulse (!) 44   Temp 97.9 F (36.6 C) (Oral)   Resp 14   Ht 6' 2"  (1.88 m)   Wt 99.8 kg   SpO2 100%   BMI 28.25 kg/m  Physical Exam Vitals and nursing note reviewed.  Constitutional:      General: He is not in acute distress.    Appearance: Normal appearance.  HENT:     Mouth/Throat:     Mouth: Mucous membranes are dry.  Eyes:     Conjunctiva/sclera: Conjunctivae normal.  Cardiovascular:     Rate and Rhythm: Normal rate and regular rhythm.  Pulmonary:     Effort: Pulmonary effort is normal. No respiratory distress.     Breath sounds: Normal breath sounds.  Abdominal:     General: Abdomen is flat.     Palpations: Abdomen is soft.     Tenderness: There is no abdominal tenderness.  Musculoskeletal:     Right lower leg:  Edema present.     Left lower leg: Edema present.  Skin:    General: Skin is warm and dry.     Capillary Refill: Capillary refill takes less than 2 seconds.  Neurological:     Mental Status: He is alert and oriented to person, place, and time. Mental status is at baseline.  Psychiatric:        Mood and Affect: Mood normal.        Behavior: Behavior normal.     ED Results and Treatments Labs (all labs ordered are listed, but only abnormal results are displayed) Labs Reviewed  COMPREHENSIVE METABOLIC PANEL - Abnormal; Notable for the following components:      Result Value   Chloride 97 (*)    Glucose, Bld 176 (*)    BUN 40 (*)    Creatinine, Ser 5.91 (*)    Albumin 2.7 (*)    GFR, Estimated 9 (*)    All other components within normal limits  CBC WITH DIFFERENTIAL/PLATELET - Abnormal; Notable for the following  components:   RBC 2.29 (*)    Hemoglobin 7.2 (*)    HCT 23.0 (*)    MCV 100.4 (*)    RDW 15.9 (*)    All other components within normal limits  CULTURE, BLOOD (ROUTINE X 2)  CULTURE, BLOOD (ROUTINE X 2)  PROTIME-INR  MAGNESIUM  PHOSPHORUS                                                                                                                          Radiology CT ABDOMEN PELVIS WO CONTRAST  Result Date: 09/13/2022 CLINICAL DATA:  Postoperative abdominal pain. Dizziness. On dialysis for 6-7 weeks. EXAM: CT ABDOMEN AND PELVIS WITHOUT CONTRAST TECHNIQUE: Multidetector CT imaging of the abdomen and pelvis was performed following the standard protocol without IV contrast. RADIATION DOSE REDUCTION: This exam was performed according to the departmental dose-optimization program which includes automated exposure control, adjustment of the mA and/or kV according to patient size and/or use of iterative reconstruction technique. COMPARISON:  CT abdomen dated 08/19/2022. FINDINGS: Lower chest: Mild bibasilar atelectasis. Hepatobiliary: Percutaneous cholecystostomy tube is  now in place. Gallbladder is appropriately decompressed. Mild residual pericholecystic inflammation. No evidence of pericholecystic abscess collection or other circumscribed fluid collection in the surrounding RIGHT upper abdomen. Scattered hypodense lesions within the bilateral liver lobes, difficult to characterize without intravascular contrast, but not significantly changed compared to an earlier CT of 12/14/2016 suggesting benignity, presumably small cysts and/or hemangiomas. No follow-up imaging is needed for this benign finding. No new or suspicious findings within the liver. No evidence of bile duct dilatation. Pancreas: Partially infiltrated with fat but otherwise unremarkable. Spleen: Normal in size without focal abnormality. Adrenals/Urinary Tract: Adrenal glands appear normal. Kidneys are unremarkable without suspicious mass, stone or hydronephrosis. Bladder is unremarkable. Stomach/Bowel: No dilated large or small bowel loops. Fairly large amount of stool within the colon. No evidence of focal acute bowel wall inflammation. Stomach is unremarkable, partially decompressed. Vascular/Lymphatic: Aortic atherosclerosis. No abdominal aortic aneurysm. No enlarged lymph nodes are identified within the abdomen or pelvis. Reproductive: Prostate is mildly enlarged, unchanged. Other: No free fluid or abscess collection is seen within the abdomen or pelvis. No free intraperitoneal air. Musculoskeletal: No acute-appearing osseous abnormality. Fixation hardware within the lumbar spine appears appropriately positioned. Superficial soft tissues of the abdomen and pelvis are unremarkable. IMPRESSION: 1. Percutaneous cholecystostomy tube is now in place. Gallbladder is appropriately decompressed. Mild residual pericholecystic inflammation. No evidence of pericholecystic abscess collection, hematoma or other complicating features. 2. Fairly large amount of stool within the colon (constipation?). 3. No free fluid or abscess  collection is seen within the abdomen or pelvis. No free intraperitoneal air. 4. Mildly enlarged prostate gland. Aortic Atherosclerosis (ICD10-I70.0). Electronically Signed   By: Franki Cabot M.D.   On: 09/13/2022 14:47   DG Chest 1 View  Result Date: 09/13/2022 CLINICAL DATA:  Weakness, dizziness EXAM: CHEST  1 VIEW COMPARISON:  Radiograph 08/19/2022 FINDINGS: Right neck catheter tip overlies the right atrium.  Unchanged mildly enlarged cardiac silhouette. There is no focal airspace consolidation. There is no pleural effusion. No pneumothorax. A spinal cord stimulator overlies the lower thoracic spine. IMPRESSION: No evidence of acute cardiopulmonary disease. Electronically Signed   By: Maurine Simmering M.D.   On: 09/13/2022 11:06    Pertinent labs & imaging results that were available during my care of the patient were reviewed by me and considered in my medical decision making (see MDM for details).  Medications Ordered in ED Medications  vancomycin (VANCOCIN) IVPB 1000 mg/200 mL premix (has no administration in time range)  vancomycin (VANCOCIN) IVPB 1000 mg/200 mL premix (has no administration in time range)  sodium chloride 0.9 % bolus 500 mL (0 mLs Intravenous Stopped 09/13/22 1136)  cefTRIAXone (ROCEPHIN) 1 g in sodium chloride 0.9 % 100 mL IVPB (0 g Intravenous Stopped 09/13/22 1240)  sodium chloride 0.9 % bolus 500 mL (0 mLs Intravenous Stopped 09/13/22 1517)  vancomycin (VANCOREADY) IVPB 2000 mg/400 mL (0 mg Intravenous Stopped 09/13/22 1447)  midodrine (PROAMATINE) tablet 10 mg (10 mg Oral Given 09/13/22 1447)  sodium chloride 0.9 % bolus 500 mL (500 mLs Intravenous New Bag/Given 09/13/22 1450)                                                                                                                                     Procedures .Critical Care  Performed by: Cristie Hem, MD Authorized by: Cristie Hem, MD   Critical care provider statement:    Critical care time  (minutes):  30   Critical care was necessary to treat or prevent imminent or life-threatening deterioration of the following conditions:  Sepsis, shock, circulatory failure and renal failure   Critical care was time spent personally by me on the following activities:  Development of treatment plan with patient or surrogate, discussions with consultants, evaluation of patient's response to treatment, examination of patient, ordering and review of laboratory studies, ordering and review of radiographic studies, ordering and performing treatments and interventions, pulse oximetry, re-evaluation of patient's condition and review of old charts   Care discussed with: admitting provider     (including critical care time)  Medical Decision Making / ED Course   MDM:  76 year old male with history as above presenting to the emergency department with weakness.  On exam, patient appears very dehydrated, has dry mucous membranes.  No fever.  Vitals notable for hypotension, bradycardia but patient is alert and oriented  Given collateral history of poor self-care, suspect dehydration, given patient is a dialysis patient is also very high risk for bacteremia, obtain blood cultures and started antibiotics, gave IV fluids.  Unclear cause of infection, no skin or soft tissue infection on exam, CT abdomen pelvis performed given recent percutaneous cholecystostomy with no acute findings, chest x-ray without evidence of pneumonia.  No headaches to suggest meningitis.  Blood pressure improved slightly with IV fluids.  We will also  give midodrine.  Patient likely needs additional IV fluid repletion given his hypotension.  Discussed with the hospitalist will admit the patient      Additional history obtained: -Additional history obtained from family and ems -External records from outside source obtained and reviewed including: Chart review including previous notes, labs, imaging, consultation notes   Lab  Tests: -I ordered, reviewed, and interpreted labs.   The pertinent results include:   Labs Reviewed  COMPREHENSIVE METABOLIC PANEL - Abnormal; Notable for the following components:      Result Value   Chloride 97 (*)    Glucose, Bld 176 (*)    BUN 40 (*)    Creatinine, Ser 5.91 (*)    Albumin 2.7 (*)    GFR, Estimated 9 (*)    All other components within normal limits  CBC WITH DIFFERENTIAL/PLATELET - Abnormal; Notable for the following components:   RBC 2.29 (*)    Hemoglobin 7.2 (*)    HCT 23.0 (*)    MCV 100.4 (*)    RDW 15.9 (*)    All other components within normal limits  CULTURE, BLOOD (ROUTINE X 2)  CULTURE, BLOOD (ROUTINE X 2)  PROTIME-INR  MAGNESIUM  PHOSPHORUS      EKG   EKG Interpretation  Date/Time:  Friday September 13 2022 11:55:33 EDT Ventricular Rate:  63 PR Interval:    QRS Duration: 137 QT Interval:  517 QTC Calculation: 530 R Axis:   26 Text Interpretation: Junctional rhythm Sinus pause Left bundle branch block Confirmed by Garnette Gunner [phone]) on 09/13/2022 12:24:33 PM         Imaging Studies ordered: I ordered imaging studies including CXR, CT A/P On my interpretation imaging demonstrates post surgical changes, clear lungs I independently visualized and interpreted imaging. I agree with the radiologist interpretation   Medicines ordered and prescription drug management: Meds ordered this encounter  Medications   sodium chloride 0.9 % bolus 500 mL   cefTRIAXone (ROCEPHIN) 1 g in sodium chloride 0.9 % 100 mL IVPB    Order Specific Question:   Antibiotic Indication:    Answer:   Bacteremia   sodium chloride 0.9 % bolus 500 mL   vancomycin (VANCOREADY) IVPB 2000 mg/400 mL    Order Specific Question:   Indication:    Answer:   Bacteremia   vancomycin (VANCOCIN) IVPB 1000 mg/200 mL premix    Order Specific Question:   Indication:    Answer:   Bacteremia   vancomycin (VANCOCIN) IVPB 1000 mg/200 mL premix    Order Specific Question:    Indication:    Answer:   Bacteremia   midodrine (PROAMATINE) tablet 10 mg   sodium chloride 0.9 % bolus 500 mL    -I have reviewed the patients home medicines and have made adjustments as needed   Consultations Obtained: I requested consultation with the hospitalist,  and discussed lab and imaging findings as well as pertinent plan - they recommend: admission   Cardiac Monitoring: The patient was maintained on a cardiac monitor.  I personally viewed and interpreted the cardiac monitored which showed an underlying rhythm of: junctional   Social Determinants of Health:  Factors impacting patients care include: former smoker   Reevaluation: After the interventions noted above, I reevaluated the patient and found that they have improved  Co morbidities that complicate the patient evaluation  Past Medical History:  Diagnosis Date   Anginal pain ([place])    Anxiety    Benign prostatic hypertrophy  Nocturia   CAD (coronary artery disease)    a.  NSTEMI 10/2012 s/p DES to LAD & DES to 1st diagonal with residual diffuse nonobstructive dz in LCx/RCA    Chronic kidney disease, stage 3, mod decreased GFR (HCC)    Creatinine of 1.5 in 03/2009, STAGE 4 now   Degenerative joint disease    Depression    hx of   Diabetes mellitus, type II ([place]) 16/83/7290   Diastolic heart failure    Pedal edema, "a long time ago"   Erectile dysfunction    Flu 2013   hx of   GERD (gastroesophageal reflux disease)    Gout    Hemorrhoids    High cholesterol    Hypertension    Exercise induced   Myocardial infarction ([place])    Neuropathy    bilateral legs   NSVT (nonsustained ventricular tachycardia) ([place])    Exercise induced   Obesity    Tobacco abuse, 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, quit 1994   Tubular adenoma 2014   Vertigo    everyday      Dispostion: Discharge    Final Clinical Impression(s) / ED Diagnoses Final diagnoses:  Hypotension, unspecified hypotension type     This  chart was dictated using voice recognition software.  Despite best efforts to proofread,  errors can occur which can change the documentation meaning.    Cristie Hem, MD 09/13/22 [phone]

## 2022-09-14 DIAGNOSIS — I959 Hypotension, unspecified: Secondary | ICD-10-CM

## 2022-09-14 DIAGNOSIS — K810 Acute cholecystitis: Secondary | ICD-10-CM

## 2022-09-14 LAB — COMPREHENSIVE METABOLIC PANEL
ALT: 11 U/L (ref 0–44)
AST: 18 U/L (ref 15–41)
Albumin: 2.5 g/dL — ABNORMAL LOW (ref 3.5–5.0)
Alkaline Phosphatase: 103 U/L (ref 38–126)
Anion gap: 13 (ref 5–15)
BUN: 47 mg/dL — ABNORMAL HIGH (ref 8–23)
CO2: 23 mmol/L (ref 22–32)
Calcium: 8.8 mg/dL — ABNORMAL LOW (ref 8.9–10.3)
Chloride: 102 mmol/L (ref 98–111)
Creatinine, Ser: 6.16 mg/dL — ABNORMAL HIGH (ref 0.61–1.24)
GFR, Estimated: 9 mL/min — ABNORMAL LOW (ref >60)
Glucose, Bld: 123 mg/dL — ABNORMAL HIGH (ref 70–99)
Potassium: 3.3 mmol/L — ABNORMAL LOW (ref 3.5–5.1)
Sodium: 138 mmol/L (ref 135–145)
Total Bilirubin: 0.9 mg/dL (ref 0.3–1.2)
Total Protein: 6.7 g/dL (ref 6.5–8.1)

## 2022-09-14 LAB — CBC
HCT: 23.8 % — ABNORMAL LOW (ref 39.0–52.0)
Hemoglobin: 7.5 g/dL — ABNORMAL LOW (ref 13.0–17.0)
MCH: 31.4 pg (ref 26.0–34.0)
MCHC: 31.5 g/dL (ref 30.0–36.0)
MCV: 99.6 fL (ref 80.0–100.0)
Platelets: 250 10*3/uL (ref 150–400)
RBC: 2.39 MIL/uL — ABNORMAL LOW (ref 4.22–5.81)
RDW: 15.7 % — ABNORMAL HIGH (ref 11.5–15.5)
WBC: 8.3 10*3/uL (ref 4.0–10.5)
nRBC: 0 % (ref 0.0–0.2)

## 2022-09-14 LAB — MRSA NEXT GEN BY PCR, NASAL: MRSA by PCR Next Gen: DETECTED — AB

## 2022-09-14 LAB — GLUCOSE, CAPILLARY
Glucose-Capillary: 116 mg/dL — ABNORMAL HIGH (ref 70–99)
Glucose-Capillary: 84 mg/dL (ref 70–99)
Glucose-Capillary: 89 mg/dL (ref 70–99)
Glucose-Capillary: 107 mg/dL — ABNORMAL HIGH (ref 70–99)

## 2022-09-14 LAB — PROCALCITONIN: Procalcitonin: 6.2 ng/mL

## 2022-09-14 MED ORDER — MUPIROCIN 2 % EX OINT
1.0000 | TOPICAL_OINTMENT | Freq: Two times a day (BID) | CUTANEOUS | Status: DC
  Administered 2022-09-14 – 2022-09-16 (×6): 1 via NASAL
  Filled 2022-09-14 (×2): qty 22

## 2022-09-14 MED ORDER — ALTEPLASE 2 MG IJ SOLR: 2.0000 mg | Freq: Once | INTRAMUSCULAR | Status: DC | PRN

## 2022-09-14 MED ORDER — HEPARIN SODIUM (PORCINE) 1000 UNIT/ML DIALYSIS: 1000.0000 [IU] | INTRAMUSCULAR | Status: DC | PRN

## 2022-09-14 MED ORDER — ANTICOAGULANT SODIUM CITRATE 4% (200MG/5ML) IV SOLN: 5.0000 mL | Status: DC | PRN

## 2022-09-14 MED ORDER — CHLORHEXIDINE GLUCONATE CLOTH 2 % EX PADS
6.0000 | MEDICATED_PAD | Freq: Every day | CUTANEOUS | Status: DC
  Administered 2022-09-14 – 2022-09-16 (×3): 6 via TOPICAL

## 2022-09-14 MED ORDER — HYDROMORPHONE HCL 1 MG/ML IJ SOLN
0.5000 mg | INTRAMUSCULAR | Status: DC | PRN
  Administered 2022-09-14 – 2022-09-15 (×2): 0.5 mg via INTRAVENOUS
  Filled 2022-09-14 (×2): qty 0.5

## 2022-09-14 NOTE — Progress Notes (Signed)
Lab called blood culture aerobic bottle gram positive [place]. Notified Dr. Manuella Ghazi.

## 2022-09-14 NOTE — Progress Notes (Signed)
Was informed that patient is here-  presented yesterday with weakness and dizziness. BP was low-  reportedly taking his BP meds which include according to the chart norvasc 10/coreg 6.25/hydralazine 100 and maybe clonidine.  Since admission his BP has improved-  holding his BP meds and gave some volume.    His post weight on 9/20 was 93.4 and today his weight is 99 ?    Missed HD yesterday so will plan for HD today off schedule with minimal volume removal just given his recent low BP.  His anemia is not helping either- no heparin  Louis Meckel

## 2022-09-14 NOTE — Procedures (Addendum)
HD Note:  Some information was entered later than the data was gathered due to patient care needs. The stated time with the data is accurate.     Patient alert, oriented, and cooperative.   TMP shut machine down.  Dr. Jonnie Finner stated to stop treatment at that point. Patient stated understanding. Patient was given pain medication for abdomen pain, patient stated gall bladder  pain, and was given medication. Patient pain has greatly diminished Total UF:200  No changes in patient status/assessment from pre dialysis assessment.

## 2022-09-14 NOTE — Progress Notes (Signed)
PROGRESS NOTE    [name]  TOI:[id number] DOB: [birth year] DOA: 09/13/2022 PCP: Redmond School, MD   Brief Narrative:    [name] is a 76 y.o. male with medical history significant of end-stage renal disease on dialysis Monday, Wednesday, Friday.  Coronary artery disease with history of NSTEMI with multiple stents, type 2 diabetes, GERD, hypertension, vertigo.  Patient was admitted in the beginning of the month with acute cholecystitis.  As the patient was a poor surgical candidate, patient had a percutaneous cholecystostomy tube placed and the patient was sent home on antibiotics.  He was admitted with feeling lightheaded and was noted to have low blood pressure readings.  He has skipped his hemodialysis 9/22.  Home antihypertensives have been held and plans are for hemodialysis today.  Assessment & Plan:   Active Problems:   ESRD (end stage renal disease) (HCC)   (HFpEF) heart failure with preserved ejection fraction (HCC)   GERD (gastroesophageal reflux disease)   Diabetes mellitus type 2 in obese (HCC)   Acute cholecystitis   Sepsis ([place])  Assessment and Plan:   Hypotension Hold antihypertensives Discontinue further antibiotics as there appears to be no source of acute intra-abdominal infection Blood cultures with no growth to date Recheck CBC in the morning If the patient needs pressor support, would use peripheral IV for the present.  Acute cholecystitis status post percutaneous cholecystostomy placement No purulent drainage from cholecystostomy tube Appreciate general surgery evaluation with no suspicion of infection at this time End-stage renal disease on dialysis Prescient nephrology plans for hemodialysis off schedule today With hypokalemia, replete per dialysis Heart failure with preserved EF Appears a little dehydrated at the present. Diabetes Continue insulin and SGLT2 Sliding scale insulin    DVT prophylaxis: Lovenox Code Status: Full Family  Communication: None at bedside Disposition Plan:  Status is: Inpatient Remains inpatient appropriate because: Need for close monitoring and IV medications.   Consultants:  General surgery Nephrology  Procedures:  None  Antimicrobials:  Anti-infectives (From admission, onward)    Start     Dose/Rate Route Frequency Ordered Stop   09/16/22 1200  vancomycin (VANCOCIN) IVPB 1000 mg/200 mL premix        1,000 mg 200 mL/hr over 60 Minutes Intravenous Every M-W-F (Hemodialysis) 09/13/22 1225     09/14/22 1200  vancomycin (VANCOCIN) IVPB 1000 mg/200 mL premix        1,000 mg 200 mL/hr over 60 Minutes Intravenous Every T-Th-Sa (Hemodialysis) 09/13/22 1233 09/17/22 1159   09/14/22 1200  cefTRIAXone (ROCEPHIN) 2 g in sodium chloride 0.9 % 100 mL IVPB        2 g 200 mL/hr over 30 Minutes Intravenous Every 24 hours 09/13/22 2002     09/13/22 1800  metroNIDAZOLE (FLAGYL) IVPB 500 mg        500 mg 100 mL/hr over 60 Minutes Intravenous Every 12 hours 09/13/22 1708     09/13/22 1145  vancomycin (VANCOREADY) IVPB 2000 mg/400 mL        2,000 mg 200 mL/hr over 120 Minutes Intravenous  Once 09/13/22 1128 09/13/22 1447   09/13/22 1130  cefTRIAXone (ROCEPHIN) 1 g in sodium chloride 0.9 % 100 mL IVPB        1 g 200 mL/hr over 30 Minutes Intravenous  Once 09/13/22 1125 09/13/22 1240       Subjective: Patient seen and evaluated today with no new acute complaints or concerns. No acute concerns or events noted overnight.  He is complaining of some  pain to his back where he has some bedsores.  He denies any abdominal pain.  Objective: Vitals:   09/14/22 0420 09/14/22 0600 09/14/22 0700 09/14/22 0806  BP:  105/67 (!) 115/51   Pulse:  (!) 51 (!) 56   Resp:  17 17   Temp: 98.7 F (37.1 C)   98.6 F (37 C)  TempSrc: Axillary   Oral  SpO2:  99% 99%   Weight:      Height:        Intake/Output Summary (Last 24 hours) at 09/14/2022 1127 Last data filed at 09/14/2022 1000 Gross per 24 hour   Intake 100.62 ml  Output 200 ml  Net -99.38 ml   Filed Weights   09/13/22 0926  Weight: 99.8 kg    Examination:  General exam: Appears calm and comfortable  Respiratory system: Clear to auscultation. Respiratory effort normal. Cardiovascular system: S1 & S2 heard, RRR.  Gastrointestinal system: Abdomen is soft, cholecystostomy drain with bilious output and no signs of overt infection. Central nervous system: Alert and awake Extremities: No edema Skin: No significant lesions noted Psychiatry: Flat affect.    Data Reviewed: I have personally reviewed following labs and imaging studies  CBC: Recent Labs  Lab 09/13/22 1029 09/14/22 0432  WBC 8.1 8.3  NEUTROABS 5.5  --   HGB 7.2* 7.5*  HCT 23.0* 23.8*  MCV 100.4* 99.6  PLT 234 625   Basic Metabolic Panel: Recent Labs  Lab 09/13/22 1029 09/14/22 0432  NA 138 138  K 3.6 3.3*  CL 97* 102  CO2 28 23  GLUCOSE 176* 123*  BUN 40* 47*  CREATININE 5.91* 6.16*  CALCIUM 9.1 8.8*  MG 2.1  --   PHOS 4.2  --    GFR: Estimated Creatinine Clearance: 12.7 mL/min (A) (by C-G formula based on SCr of 6.16 mg/dL (H)). Liver Function Tests: Recent Labs  Lab 09/13/22 1029 09/14/22 0432  AST 22 18  ALT 13 11  ALKPHOS 114 103  BILITOT 0.9 0.9  PROT 7.5 6.7  ALBUMIN 2.7* 2.5*   No results for input(s): "LIPASE", "AMYLASE" in the last 168 hours. No results for input(s): "AMMONIA" in the last 168 hours. Coagulation Profile: Recent Labs  Lab 09/13/22 1029  INR 1.1   Cardiac Enzymes: No results for input(s): "CKTOTAL", "CKMB", "CKMBINDEX", "TROPONINI" in the last 168 hours. BNP (last 3 results) No results for input(s): "PROBNP" in the last 8760 hours. HbA1C: No results for input(s): "HGBA1C" in the last 72 hours. CBG: Recent Labs  Lab 09/13/22 2133 09/14/22 0741  GLUCAP 131* 116*   Lipid Profile: No results for input(s): "CHOL", "HDL", "LDLCALC", "TRIG", "CHOLHDL", "LDLDIRECT" in the last 72 hours. Thyroid  Function Tests: No results for input(s): "TSH", "T4TOTAL", "FREET4", "T3FREE", "THYROIDAB" in the last 72 hours. Anemia Panel: No results for input(s): "VITAMINB12", "FOLATE", "FERRITIN", "TIBC", "IRON", "RETICCTPCT" in the last 72 hours. Sepsis Labs: Recent Labs  Lab 09/13/22 1704 09/14/22 0432  PROCALCITON 9.20 6.20    Recent Results (from the past 240 hour(s))  Blood culture (routine x 2)     Status: None (Preliminary result)   Collection Time: 09/13/22 11:33 AM   Specimen: BLOOD  Result Value Ref Range Status   Specimen Description BLOOD RIGHT HAND  Final   Special Requests   Final    BOTTLES DRAWN AEROBIC AND ANAEROBIC Blood Culture results may not be optimal due to an excessive volume of blood received in culture bottles   Culture   Final  NO GROWTH < 12 HOURS Performed at [facility], [address], [place], [place] [address]    Report Status PENDING  Incomplete  Blood culture (routine x 2)     Status: None (Preliminary result)   Collection Time: 09/13/22 11:45 AM   Specimen: BLOOD  Result Value Ref Range Status   Specimen Description BLOOD LEFT HAND  Final   Special Requests   Final    BOTTLES DRAWN AEROBIC ONLY Blood Culture results may not be optimal due to an excessive volume of blood received in culture bottles   Culture   Final    NO GROWTH < 12 HOURS Performed at [facility], [address], [place], [place] [address]    Report Status PENDING  Incomplete  MRSA Next Gen by PCR, Nasal     Status: Abnormal   Collection Time: 09/13/22  8:03 PM   Specimen: Nasal Mucosa; Nasal Swab  Result Value Ref Range Status   MRSA by PCR Next Gen DETECTED (A) NOT DETECTED Final    Comment: RESULT CALLED TO, READ BACK BY AND VERIFIED WITH: KINDLEY,J ON 09/14/2022 @ 0233 BY ACOSTA,A Performed at [facility], [address], [place], [place] [address]          Radiology Studies: CT ABDOMEN PELVIS WO CONTRAST  Result Date: 09/13/2022 CLINICAL DATA:  Postoperative  abdominal pain. Dizziness. On dialysis for 6-7 weeks. EXAM: CT ABDOMEN AND PELVIS WITHOUT CONTRAST TECHNIQUE: Multidetector CT imaging of the abdomen and pelvis was performed following the standard protocol without IV contrast. RADIATION DOSE REDUCTION: This exam was performed according to the departmental dose-optimization program which includes automated exposure control, adjustment of the mA and/or kV according to patient size and/or use of iterative reconstruction technique. COMPARISON:  CT abdomen dated 08/19/2022. FINDINGS: Lower chest: Mild bibasilar atelectasis. Hepatobiliary: Percutaneous cholecystostomy tube is now in place. Gallbladder is appropriately decompressed. Mild residual pericholecystic inflammation. No evidence of pericholecystic abscess collection or other circumscribed fluid collection in the surrounding RIGHT upper abdomen. Scattered hypodense lesions within the bilateral liver lobes, difficult to characterize without intravascular contrast, but not significantly changed compared to an earlier CT of 12/14/2016 suggesting benignity, presumably small cysts and/or hemangiomas. No follow-up imaging is needed for this benign finding. No new or suspicious findings within the liver. No evidence of bile duct dilatation. Pancreas: Partially infiltrated with fat but otherwise unremarkable. Spleen: Normal in size without focal abnormality. Adrenals/Urinary Tract: Adrenal glands appear normal. Kidneys are unremarkable without suspicious mass, stone or hydronephrosis. Bladder is unremarkable. Stomach/Bowel: No dilated large or small bowel loops. Fairly large amount of stool within the colon. No evidence of focal acute bowel wall inflammation. Stomach is unremarkable, partially decompressed. Vascular/Lymphatic: Aortic atherosclerosis. No abdominal aortic aneurysm. No enlarged lymph nodes are identified within the abdomen or pelvis. Reproductive: Prostate is mildly enlarged, unchanged. Other: No free fluid  or abscess collection is seen within the abdomen or pelvis. No free intraperitoneal air. Musculoskeletal: No acute-appearing osseous abnormality. Fixation hardware within the lumbar spine appears appropriately positioned. Superficial soft tissues of the abdomen and pelvis are unremarkable. IMPRESSION: 1. Percutaneous cholecystostomy tube is now in place. Gallbladder is appropriately decompressed. Mild residual pericholecystic inflammation. No evidence of pericholecystic abscess collection, hematoma or other complicating features. 2. Fairly large amount of stool within the colon (constipation?). 3. No free fluid or abscess collection is seen within the abdomen or pelvis. No free intraperitoneal air. 4. Mildly enlarged prostate gland. Aortic Atherosclerosis (ICD10-I70.0). Electronically Signed   By: Roxy Horseman.D.  On: 09/13/2022 14:47   DG Chest 1 View  Result Date: 09/13/2022 CLINICAL DATA:  Weakness, dizziness EXAM: CHEST  1 VIEW COMPARISON:  Radiograph 08/19/2022 FINDINGS: Right neck catheter tip overlies the right atrium. Unchanged mildly enlarged cardiac silhouette. There is no focal airspace consolidation. There is no pleural effusion. No pneumothorax. A spinal cord stimulator overlies the lower thoracic spine. IMPRESSION: No evidence of acute cardiopulmonary disease. Electronically Signed   By: Maurine Simmering M.D.   On: 09/13/2022 11:06        Scheduled Meds:  acarbose  50 mg Oral TID WC   aspirin EC  81 mg Oral Daily   Chlorhexidine Gluconate Cloth  6 each Topical Q0600   Chlorhexidine Gluconate Cloth  6 each Topical Q0600   cycloSPORINE  1 drop Both Eyes BID   enoxaparin (LOVENOX) injection  30 mg Subcutaneous Q24H   insulin aspart  0-15 Units Subcutaneous TID WC   insulin aspart  0-5 Units Subcutaneous QHS   insulin NPH Human  18 Units Subcutaneous Q breakfast   linagliptin  5 mg Oral Daily   mupirocin ointment  1 Application Nasal BID   pravastatin  40 mg Oral QPM   Continuous  Infusions:  cefTRIAXone (ROCEPHIN)  IV     metronidazole 500 mg (09/14/22 0641)   [START ON 09/16/2022] vancomycin     vancomycin       LOS: 1 day    Time spent: 35 minutes    Christain Mcraney Darleen Crocker, DO [facility]  If 7PM-7AM, please contact night-coverage [email or web address] 09/14/2022, 11:27 AM

## 2022-09-14 NOTE — Consult Note (Signed)
[facility] Consult  Reason for Consult: Cholecystostomy tube in place, hypotension? Infection  Referring Physician: Dr. Nehemiah Settle   Chief Complaint   Dizziness     HPI: [name] is a 76 y.o. male with ESRD on dialysis, newly started in August, admission for cholecystitis in the setting of heart failure and initiation of dialysis just the week prior, and cholecystostomy tube placed to temporize the infection until patient at better point to get cholecystectomy. Denies any abdominal pain. Eating. Says that the drain has been draining. Reports dizziness yesterday and getting his niece to bring him to the hospital.  Dr. Nehemiah Settle let me know if was here and asked if I look at things to ensure tube ok. He was working him up for potential infection given hypotension versus hypotension related to dialysis and BP meds.   Past Medical History:  Diagnosis Date   Anginal pain ([place])    Anxiety    Benign prostatic hypertrophy    Nocturia   CAD (coronary artery disease)    a.  NSTEMI 10/2012 s/p DES to LAD & DES to 1st diagonal with residual diffuse nonobstructive dz in LCx/RCA    Chronic kidney disease, stage 3, mod decreased GFR (HCC)    Creatinine of 1.5 in 03/2009, STAGE 4 now   Degenerative joint disease    Depression    hx of   Diabetes mellitus, type II ([place]) 60/73/7106   Diastolic heart failure    Pedal edema, "a long time ago"   Erectile dysfunction    Flu 2013   hx of   GERD (gastroesophageal reflux disease)    Gout    Hemorrhoids    High cholesterol    Hypertension    Exercise induced   Myocardial infarction ([place])    Neuropathy    bilateral legs   NSVT (nonsustained ventricular tachycardia) ([place])    Exercise induced   Obesity    Tobacco abuse, 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, quit 1994   Tubular adenoma 2014   Vertigo    everyday    Past Surgical History:  Procedure Laterality Date   BACK SURGERY  2007   x2   BIOPSY  02/05/2021   Procedure:  BIOPSY;  Surgeon: Daneil Dolin, MD;  [facility];  Service: Endoscopy;;   CARDIAC CATHETERIZATION     with stent placement   COLONOSCOPY WITH ESOPHAGOGASTRODUODENOSCOPY (EGD) N/A 11/23/2013   Dr. Oneida Alar- TCS= left sided colitis and mild proctitis likely due to ischemic colitis, moderate internal hemorrhoids, small nodule in the rectum, bx=tubular adenoma, EGD=-mild non-erosive gastritis   COLONOSCOPY WITH PROPOFOL N/A 07/24/2020   hemorrhoids,  rectosigmoid lipoma, small adenoma removed.   CORONARY ANGIOPLASTY     ESOPHAGOGASTRODUODENOSCOPY (EGD) WITH PROPOFOL N/A 02/05/2021   Normal esophagus, erythematous mucosa in entire stomach, no obvious ulcer, s/p biopsy. Normal duodenum. Negative H.pylori.    HERNIA REPAIR     INSERTION OF DIALYSIS CATHETER N/A 08/06/2022   Procedure: INSERTION OF DIALYSIS CATHETER;  Surgeon: Rosetta Posner, MD;  Location: [facility];  Service: Vascular;  Laterality: N/A;   IR PERC CHOLECYSTOSTOMY  08/20/2022   JOINT REPLACEMENT     LEFT HEART CATHETERIZATION WITH CORONARY ANGIOGRAM N/A 11/16/2012   Procedure: LEFT HEART CATHETERIZATION WITH CORONARY ANGIOGRAM;  Surgeon: Sherren Mocha, MD;  Location: [facility] CATH LAB;  Service: Cardiovascular;  Laterality: N/A;   PERCUTANEOUS CORONARY STENT INTERVENTION (PCI-S)  11/16/2012   Procedure: PERCUTANEOUS CORONARY STENT INTERVENTION (PCI-S);  Surgeon: Legrand Como  Burt Knack, MD;  Location: [facility] CATH LAB;  Service: Cardiovascular;;   POLYPECTOMY  07/24/2020   Procedure: POLYPECTOMY;  Surgeon: Daneil Dolin, MD;  [facility];  Service: Endoscopy;;   SPINAL CORD STIMULATOR INSERTION N/A 06/10/2022   Procedure: Spinal cord stimulator placement;  Surgeon: Newman Pies, MD;  Location: [facility];  Service: Neurosurgery;  Laterality: N/A;  RM 19/ to follow   THUMB AMPUTATION Left    50 years ago   TOTAL KNEE ARTHROPLASTY Right 11/15/2013   Procedure: RIGHT TOTAL KNEE ARTHROPLASTY;  Surgeon: Mauri Pole, MD;  Location: [facility];  Service: Orthopedics;  Laterality: Right;   TOTAL KNEE ARTHROPLASTY Left 03/07/2015   Procedure: LEFT TOTAL KNEE ARTHROPLASTY;  Surgeon: Paralee Cancel, MD;  Location: [facility]S;  Service: Orthopedics;  Laterality: Left;    Family History  Problem Relation Age of Onset   Hypertension Other    Cancer Other    Heart disease Other    Diabetes Other    Hypertension Mother    Cancer Father        brain   CAD Brother    Colon cancer Neg Hx     Social History   Tobacco Use   Smoking status: Former    Packs/day: 1.00    Years: 30.00    Total pack years: 30.00    Types: Cigarettes    Start date: 01/22/1965    Quit date: 12/23/1992    Years since quitting: 29.7   Smokeless tobacco: Never  Vaping Use   Vaping Use: Never used  Substance Use Topics   Alcohol use: No    Alcohol/week: 0.0 standard drinks of alcohol   Drug use: No    Medications: I have reviewed the patient's current medications. Prior to Admission:  Medications Prior to Admission  Medication Sig Dispense Refill Last Dose   acarbose (PRECOSE) 50 MG tablet Take 50 mg by mouth in the morning, at noon, and at bedtime.    09/13/2022   acetaminophen (TYLENOL) 500 MG tablet Take 2 tablets (1,000 mg total) by mouth every 6 (six) hours. 30 tablet 0 09/13/2022   allopurinol (ZYLOPRIM) 100 MG tablet Take 100 mg by mouth daily.   09/13/2022   amLODipine (NORVASC) 10 MG tablet Take 10 mg by mouth every evening.    09/12/2022   ascorbic acid (VITAMIN C) 500 MG tablet Take 500 mg by mouth daily.   09/13/2022   aspirin EC 81 MG tablet Take 1 tablet (81 mg total) by mouth daily. 90 tablet 3 09/13/2022   B Complex-C-Zn-Folic Acid (DIALYVITE 914-NWGN 15) 0.8 MG TABS Take 1 tablet by mouth daily.   09/13/2022   carboxymethylcellulose (REFRESH PLUS) 0.5 % SOLN Place 1 drop into both eyes daily as needed (dry eyes).   unknown   carvedilol (COREG) 6.25 MG tablet Take 6.25 mg by mouth 2 (two) times daily with a meal.   09/13/2022 at 0800    hydrALAZINE (APRESOLINE) 100 MG tablet Take 1 tablet (100 mg total) by mouth 3 (three) times daily. 90 tablet 0 09/13/2022   insulin NPH Human (NOVOLIN N) 100 UNIT/ML injection Inject 0.18 mLs (18 Units total) into the skin every morning. Ands syringes 1/day (Patient taking differently: Inject 18 Units into the skin daily.) 10 mL 11 09/13/2022   isosorbide mononitrate (IMDUR) 60 MG 24 hr tablet Take 1 tablet (60 mg total) by mouth 2 (two) times daily. 180 tablet 3 09/13/2022   meclizine (ANTIVERT) 12.5 MG tablet Take 12.5  mg by mouth 3 (three) times daily as needed for dizziness.   09/13/2022   melatonin 5 MG TABS Take 5 mg by mouth at bedtime.   09/12/2022   nitroGLYCERIN (NITROSTAT) 0.4 MG SL tablet Place 1 tablet (0.4 mg total) under the tongue every 5 (five) minutes as needed for chest pain. PLACE ONE TABLET UNDER THE TONGUE EVERY 5 MINUTES FOR 3 DOSES AS NEEDED Strength: 0.4 mg 25 tablet 3 unknown   polyethylene glycol (MIRALAX / GLYCOLAX) 17 g packet Take 17 g by mouth daily. 14 each 0 09/12/2022   pravastatin (PRAVACHOL) 40 MG tablet Take 1 tablet (40 mg total) by mouth every evening. 90 tablet 3 09/12/2022   promethazine (PHENERGAN) 25 MG suppository Place 25 mg rectally every 12 (twelve) hours as needed for nausea or vomiting.   unknown   RESTASIS 0.05 % ophthalmic emulsion Place 1 drop into both eyes 2 (two) times daily.   09/13/2022   senna (SENOKOT) 8.6 MG TABS tablet Take 2 tablets (17.2 mg total) by mouth 2 (two) times daily. 120 tablet 0 09/13/2022   simethicone (MYLICON) 80 MG chewable tablet Chew 80 mg by mouth every 6 (six) hours as needed for flatulence.   unknown   sitaGLIPtin (JANUVIA) 25 MG tablet Take 25 mg by mouth daily.   09/13/2022   traMADol (ULTRAM) 50 MG tablet Take 50 mg by mouth 4 (four) times daily as needed for pain.   unknown   Zinc 50 MG TABS Take 1 tablet by mouth daily.   09/13/2022   amoxicillin-clavulanate (AUGMENTIN) 500-125 MG tablet Take 1 tablet (500 mg total) by mouth  2 (two) times daily. (Patient not taking: Reported on 09/13/2022) 7 tablet 0 Completed Course   calcitRIOL (ROCALTROL) 0.25 MCG capsule Take 0.25 mcg by mouth every Sunday.       cloNIDine (CATAPRES) 0.1 MG tablet Take 0.1 mg by mouth 3 (three) times daily.      epoetin alfa (EPOGEN) 3000 UNIT/ML injection Inject into the skin.      TRUE METRIX BLOOD GLUCOSE TEST test strip USE TO TEST BLOOD SUGAR UP TO TWICE DAILY OR AS DIRECTED 180 each 3    Scheduled:  acarbose  50 mg Oral TID WC   aspirin EC  81 mg Oral Daily   Chlorhexidine Gluconate Cloth  6 each Topical Q0600   Chlorhexidine Gluconate Cloth  6 each Topical Q0600   cycloSPORINE  1 drop Both Eyes BID   enoxaparin (LOVENOX) injection  30 mg Subcutaneous Q24H   insulin aspart  0-15 Units Subcutaneous TID WC   insulin aspart  0-5 Units Subcutaneous QHS   insulin NPH Human  18 Units Subcutaneous Q breakfast   linagliptin  5 mg Oral Daily   mupirocin ointment  1 Application Nasal BID   pravastatin  40 mg Oral QPM   Continuous: ZOX:[id number] (DILAUDID) injection, ondansetron **OR** ondansetron (ZOFRAN) IV, polyethylene glycol, polyvinyl alcohol, traMADol  Allergies  Allergen Reactions   Metformin Nausea And Vomiting   Niacin    Niacin And Related Other (See Comments)    Reaction: flushing and burning side effects even with extended release     ROS:  A comprehensive review of systems was negative except for: Genitourinary: positive for ESRD on dialysis Neurological: positive for dizziness  Blood pressure (!) 115/51, pulse (!) 56, temperature 98.6 F (37 C), temperature source Oral, resp. rate 17, height 6' 2"  (1.88 m), weight 99.8 kg, SpO2 99 %. Physical Exam Vitals reviewed.  Constitutional:  Appearance: Normal appearance.  HENT:     Head: Normocephalic.     Nose: Nose normal.  Eyes:     Extraocular Movements: Extraocular movements intact.  Cardiovascular:     Rate and Rhythm: Normal rate.  Pulmonary:      Effort: Pulmonary effort is normal.  Abdominal:     General: There is no distension.     Palpations: Abdomen is soft.     Tenderness: There is no abdominal tenderness.     Comments: Cholecystostomy tube with bile   Skin:    General: Skin is warm.  Neurological:     General: No focal deficit present.     Mental Status: He is alert.  Psychiatric:        Behavior: Behavior normal.     Results: Results for orders placed or performed during the hospital encounter of 09/13/22 (from the past 48 hour(s))  Comprehensive metabolic panel     Status: Abnormal   Collection Time: 09/13/22 10:29 AM  Result Value Ref Range   Sodium 138 135 - 145 mmol/L   Potassium 3.6 3.5 - 5.1 mmol/L   Chloride 97 (L) 98 - 111 mmol/L   CO2 28 22 - 32 mmol/L   Glucose, Bld 176 (H) 70 - 99 mg/dL    Comment: Glucose reference range applies only to samples taken after fasting for at least 8 hours.   BUN 40 (H) 8 - 23 mg/dL   Creatinine, Ser 5.91 (H) 0.61 - 1.24 mg/dL   Calcium 9.1 8.9 - 10.3 mg/dL   Total Protein 7.5 6.5 - 8.1 g/dL   Albumin 2.7 (L) 3.5 - 5.0 g/dL   AST 22 15 - 41 U/L   ALT 13 0 - 44 U/L   Alkaline Phosphatase 114 38 - 126 U/L   Total Bilirubin 0.9 0.3 - 1.2 mg/dL   GFR, Estimated 9 (L) >60 mL/min    Comment: (NOTE) Calculated using the CKD-EPI Creatinine Equation (2021)    Anion gap 13 5 - 15    Comment: Performed at [facility], [address] [place], [place] [address]  CBC with Differential     Status: Abnormal   Collection Time: 09/13/22 10:29 AM  Result Value Ref Range   WBC 8.1 4.0 - 10.5 K/uL   RBC 2.29 (L) 4.22 - 5.81 MIL/uL   Hemoglobin 7.2 (L) 13.0 - 17.0 g/dL   HCT 23.0 (L) 39.0 - 52.0 %   MCV 100.4 (H) 80.0 - 100.0 fL   MCH 31.4 26.0 - 34.0 pg   MCHC 31.3 30.0 - 36.0 g/dL   RDW 15.9 (H) 11.5 - 15.5 %   Platelets 234 150 - 400 K/uL   nRBC 0.0 0.0 - 0.2 %   Neutrophils Relative % 67 %   Neutro Abs 5.5 1.7 - 7.7 K/uL   Lymphocytes Relative 18 %   Lymphs Abs 1.5 0.7 -  4.0 K/uL   Monocytes Relative 9 %   Monocytes Absolute 0.7 0.1 - 1.0 K/uL   Eosinophils Relative 4 %   Eosinophils Absolute 0.3 0.0 - 0.5 K/uL   Basophils Relative 1 %   Basophils Absolute 0.0 0.0 - 0.1 K/uL   Immature Granulocytes 1 %   Abs Immature Granulocytes 0.05 0.00 - 0.07 K/uL    Comment: Performed at [facility], [address], [place], [place] [address]  Protime-INR     Status: None   Collection Time: 09/13/22 10:29 AM  Result Value Ref Range   Prothrombin Time  14.0 11.4 - 15.2 seconds   INR 1.1 0.8 - 1.2    Comment: (NOTE) INR goal varies based on device and disease states. Performed at [facility], [address], [place], [place] [address]   Magnesium     Status: None   Collection Time: 09/13/22 10:29 AM  Result Value Ref Range   Magnesium 2.1 1.7 - 2.4 mg/dL    Comment: Performed at [facility], [address] [place], [place] [address]  Phosphorus     Status: None   Collection Time: 09/13/22 10:29 AM  Result Value Ref Range   Phosphorus 4.2 2.5 - 4.6 mg/dL    Comment: Performed at [facility], [address], [place], [place] [address]  Blood culture (routine x 2)     Status: None (Preliminary result)   Collection Time: 09/13/22 11:33 AM   Specimen: BLOOD  Result Value Ref Range   Specimen Description BLOOD RIGHT HAND    Special Requests      BOTTLES DRAWN AEROBIC AND ANAEROBIC Blood Culture results may not be optimal due to an excessive volume of blood received in culture bottles   Culture      NO GROWTH < 12 HOURS Performed at [facility], [address] [place], [place] [address]    Report Status PENDING   Blood culture (routine x 2)     Status: None (Preliminary result)   Collection Time: 09/13/22 11:45 AM   Specimen: BLOOD  Result Value Ref Range   Specimen Description BLOOD LEFT HAND    Special Requests      BOTTLES DRAWN AEROBIC ONLY Blood Culture results may not be optimal due to an excessive volume of blood received in culture bottles    Culture      NO GROWTH < 12 HOURS Performed at [facility], [address], [place], [place] [address]    Report Status PENDING   Procalcitonin - Baseline     Status: None   Collection Time: 09/13/22  5:04 PM  Result Value Ref Range   Procalcitonin 9.20 ng/mL    Comment:        Interpretation: PCT > 2 ng/mL: Systemic infection (sepsis) is likely, unless other causes are known. (NOTE)       Sepsis PCT Algorithm           Lower Respiratory Tract                                      Infection PCT Algorithm    ----------------------------     ----------------------------         PCT < 0.25 ng/mL                PCT < 0.10 ng/mL          Strongly encourage             Strongly discourage   discontinuation of antibiotics    initiation of antibiotics    ----------------------------     -----------------------------       PCT 0.25 - 0.50 ng/mL            PCT 0.10 - 0.25 ng/mL               OR       >80% decrease in PCT            Discourage initiation of  antibiotics      Encourage discontinuation           of antibiotics    ----------------------------     -----------------------------         PCT >= 0.50 ng/mL              PCT 0.26 - 0.50 ng/mL               AND       <80% decrease in PCT              Encourage initiation of                                             antibiotics       Encourage continuation           of antibiotics    ----------------------------     -----------------------------        PCT >= 0.50 ng/mL                  PCT > 0.50 ng/mL               AND         increase in PCT                  Strongly encourage                                      initiation of antibiotics    Strongly encourage escalation           of antibiotics                                     -----------------------------                                           PCT <= 0.25 ng/mL                                                 OR                                         > 80% decrease in PCT                                      Discontinue / Do not initiate                                             antibiotics  Performed at [facility], [address] [place], [place] [address]   MRSA Next Gen by PCR, Nasal     Status: Abnormal  Collection Time: 09/13/22  8:03 PM   Specimen: Nasal Mucosa; Nasal Swab  Result Value Ref Range   MRSA by PCR Next Gen DETECTED (A) NOT DETECTED    Comment: RESULT CALLED TO, READ BACK BY AND VERIFIED WITH: KINDLEY,J ON 09/14/2022 @ 0233 BY ACOSTA,A Performed at [facility], [address], [place], [place] [address]   Glucose, capillary     Status: Abnormal   Collection Time: 09/13/22  9:33 PM  Result Value Ref Range   Glucose-Capillary 131 (H) 70 - 99 mg/dL    Comment: Glucose reference range applies only to samples taken after fasting for at least 8 hours.  Procalcitonin     Status: None   Collection Time: 09/14/22  4:32 AM  Result Value Ref Range   Procalcitonin 6.20 ng/mL    Comment:        Interpretation: PCT > 2 ng/mL: Systemic infection (sepsis) is likely, unless other causes are known. (NOTE)       Sepsis PCT Algorithm           Lower Respiratory Tract                                      Infection PCT Algorithm    ----------------------------     ----------------------------         PCT < 0.25 ng/mL                PCT < 0.10 ng/mL          Strongly encourage             Strongly discourage   discontinuation of antibiotics    initiation of antibiotics    ----------------------------     -----------------------------       PCT 0.25 - 0.50 ng/mL            PCT 0.10 - 0.25 ng/mL               OR       >80% decrease in PCT            Discourage initiation of                                            antibiotics      Encourage discontinuation           of antibiotics    ----------------------------     -----------------------------         PCT >= 0.50 ng/mL              PCT 0.26  - 0.50 ng/mL               AND       <80% decrease in PCT              Encourage initiation of                                             antibiotics       Encourage continuation           of antibiotics    ----------------------------     -----------------------------  PCT >= 0.50 ng/mL                  PCT > 0.50 ng/mL               AND         increase in PCT                  Strongly encourage                                      initiation of antibiotics    Strongly encourage escalation           of antibiotics                                     -----------------------------                                           PCT <= 0.25 ng/mL                                                 OR                                        > 80% decrease in PCT                                      Discontinue / Do not initiate                                             antibiotics  Performed at [facility], [address], [place], [place] [address]   Comprehensive metabolic panel     Status: Abnormal   Collection Time: 09/14/22  4:32 AM  Result Value Ref Range   Sodium 138 135 - 145 mmol/L   Potassium 3.3 (L) 3.5 - 5.1 mmol/L   Chloride 102 98 - 111 mmol/L   CO2 23 22 - 32 mmol/L   Glucose, Bld 123 (H) 70 - 99 mg/dL    Comment: Glucose reference range applies only to samples taken after fasting for at least 8 hours.   BUN 47 (H) 8 - 23 mg/dL   Creatinine, Ser 6.16 (H) 0.61 - 1.24 mg/dL   Calcium 8.8 (L) 8.9 - 10.3 mg/dL   Total Protein 6.7 6.5 - 8.1 g/dL   Albumin 2.5 (L) 3.5 - 5.0 g/dL   AST 18 15 - 41 U/L   ALT 11 0 - 44 U/L   Alkaline Phosphatase 103 38 - 126 U/L   Total Bilirubin 0.9 0.3 - 1.2 mg/dL   GFR, Estimated 9 (L) >60 mL/min    Comment: (NOTE) Calculated using the CKD-EPI Creatinine Equation (2021)    Anion gap 13 5 - 15  Comment: Performed at [facility], [address], [place], [place] [address]  CBC     Status: Abnormal   Collection Time: 09/14/22  4:32  AM  Result Value Ref Range   WBC 8.3 4.0 - 10.5 K/uL   RBC 2.39 (L) 4.22 - 5.81 MIL/uL   Hemoglobin 7.5 (L) 13.0 - 17.0 g/dL   HCT 23.8 (L) 39.0 - 52.0 %   MCV 99.6 80.0 - 100.0 fL   MCH 31.4 26.0 - 34.0 pg   MCHC 31.5 30.0 - 36.0 g/dL   RDW 15.7 (H) 11.5 - 15.5 %   Platelets 250 150 - 400 K/uL   nRBC 0.0 0.0 - 0.2 %    Comment: Performed at [facility], [address], [place], [place] [address]  Glucose, capillary     Status: Abnormal   Collection Time: 09/14/22  7:41 AM  Result Value Ref Range   Glucose-Capillary 116 (H) 70 - 99 mg/dL    Comment: Glucose reference range applies only to samples taken after fasting for at least 8 hours.   Personally reviewed- chole tube in place, contracted gallbladder  CT ABDOMEN PELVIS WO CONTRAST  Result Date: 09/13/2022 CLINICAL DATA:  Postoperative abdominal pain. Dizziness. On dialysis for 6-7 weeks. EXAM: CT ABDOMEN AND PELVIS WITHOUT CONTRAST TECHNIQUE: Multidetector CT imaging of the abdomen and pelvis was performed following the standard protocol without IV contrast. RADIATION DOSE REDUCTION: This exam was performed according to the departmental dose-optimization program which includes automated exposure control, adjustment of the mA and/or kV according to patient size and/or use of iterative reconstruction technique. COMPARISON:  CT abdomen dated 08/19/2022. FINDINGS: Lower chest: Mild bibasilar atelectasis. Hepatobiliary: Percutaneous cholecystostomy tube is now in place. Gallbladder is appropriately decompressed. Mild residual pericholecystic inflammation. No evidence of pericholecystic abscess collection or other circumscribed fluid collection in the surrounding RIGHT upper abdomen. Scattered hypodense lesions within the bilateral liver lobes, difficult to characterize without intravascular contrast, but not significantly changed compared to an earlier CT of 12/14/2016 suggesting benignity, presumably small cysts and/or hemangiomas. No follow-up  imaging is needed for this benign finding. No new or suspicious findings within the liver. No evidence of bile duct dilatation. Pancreas: Partially infiltrated with fat but otherwise unremarkable. Spleen: Normal in size without focal abnormality. Adrenals/Urinary Tract: Adrenal glands appear normal. Kidneys are unremarkable without suspicious mass, stone or hydronephrosis. Bladder is unremarkable. Stomach/Bowel: No dilated large or small bowel loops. Fairly large amount of stool within the colon. No evidence of focal acute bowel wall inflammation. Stomach is unremarkable, partially decompressed. Vascular/Lymphatic: Aortic atherosclerosis. No abdominal aortic aneurysm. No enlarged lymph nodes are identified within the abdomen or pelvis. Reproductive: Prostate is mildly enlarged, unchanged. Other: No free fluid or abscess collection is seen within the abdomen or pelvis. No free intraperitoneal air. Musculoskeletal: No acute-appearing osseous abnormality. Fixation hardware within the lumbar spine appears appropriately positioned. Superficial soft tissues of the abdomen and pelvis are unremarkable. IMPRESSION: 1. Percutaneous cholecystostomy tube is now in place. Gallbladder is appropriately decompressed. Mild residual pericholecystic inflammation. No evidence of pericholecystic abscess collection, hematoma or other complicating features. 2. Fairly large amount of stool within the colon (constipation?). 3. No free fluid or abscess collection is seen within the abdomen or pelvis. No free intraperitoneal air. 4. Mildly enlarged prostate gland. Aortic Atherosclerosis (ICD10-I70.0). Electronically Signed   By: Franki Cabot M.D.   On: 09/13/2022 14:47   DG Chest 1 View  Result Date: 09/13/2022 CLINICAL DATA:  Weakness, dizziness EXAM: CHEST  1  VIEW COMPARISON:  Radiograph 08/19/2022 FINDINGS: Right neck catheter tip overlies the right atrium. Unchanged mildly enlarged cardiac silhouette. There is no focal airspace  consolidation. There is no pleural effusion. No pneumothorax. A spinal cord stimulator overlies the lower thoracic spine. IMPRESSION: No evidence of acute cardiopulmonary disease. Electronically Signed   By: Maurine Simmering M.D.   On: 09/13/2022 11:06     Assessment & Plan:  [name] is a 76 y.o. male with cholecystostomy tube in place and draining. LFTs good, no pain, and CT reassuring. I do not think his hypotension or any possible infection related to his gallbladder.   Follow up with me as an outpatient.  Future Appointments  Date Time Provider [place]  09/24/2022  9:30 AM Virl Cagey, MD [facility] None    Will need cardiology to risk stratify him before we can get the cholecystostomy tube out and do cholecystectomy.  Told him the tubes normally stay in at least 8 weeks if not longer. He had his placed at the end of August.   All questions were answered to the satisfaction of the patient.   Virl Cagey 09/14/2022, 10:56 AM

## 2022-09-15 LAB — MAGNESIUM: Magnesium: 1.7 mg/dL (ref 1.7–2.4)

## 2022-09-15 LAB — CBC
HCT: 20.4 % — ABNORMAL LOW (ref 39.0–52.0)
Hemoglobin: 6.5 g/dL — CL (ref 13.0–17.0)
MCH: 31.9 pg (ref 26.0–34.0)
MCHC: 31.9 g/dL (ref 30.0–36.0)
MCV: 100 fL (ref 80.0–100.0)
Platelets: 237 10*3/uL (ref 150–400)
RBC: 2.04 MIL/uL — ABNORMAL LOW (ref 4.22–5.81)
RDW: 15.6 % — ABNORMAL HIGH (ref 11.5–15.5)
WBC: 9 10*3/uL (ref 4.0–10.5)
nRBC: 0 % (ref 0.0–0.2)

## 2022-09-15 LAB — IRON AND TIBC
Iron: 13 ug/dL — ABNORMAL LOW (ref 45–182)
Saturation Ratios: 9 % — ABNORMAL LOW (ref 17.9–39.5)
TIBC: 144 ug/dL — ABNORMAL LOW (ref 250–450)
UIBC: 131 ug/dL

## 2022-09-15 LAB — GLUCOSE, CAPILLARY
Glucose-Capillary: 90 mg/dL (ref 70–99)
Glucose-Capillary: 85 mg/dL (ref 70–99)
Glucose-Capillary: 60 mg/dL — ABNORMAL LOW (ref 70–99)
Glucose-Capillary: 50 mg/dL — ABNORMAL LOW (ref 70–99)
Glucose-Capillary: 142 mg/dL — ABNORMAL HIGH (ref 70–99)
Glucose-Capillary: 109 mg/dL — ABNORMAL HIGH (ref 70–99)

## 2022-09-15 LAB — RETICULOCYTES
Immature Retic Fract: 24.3 % — ABNORMAL HIGH (ref 2.3–15.9)
RBC.: 2.3 MIL/uL — ABNORMAL LOW (ref 4.22–5.81)
Retic Count, Absolute: 44.9 10*3/uL (ref 19.0–186.0)
Retic Ct Pct: 2 % (ref 0.4–3.1)

## 2022-09-15 LAB — BASIC METABOLIC PANEL
Anion gap: 10 (ref 5–15)
BUN: 25 mg/dL — ABNORMAL HIGH (ref 8–23)
CO2: 28 mmol/L (ref 22–32)
Calcium: 8.7 mg/dL — ABNORMAL LOW (ref 8.9–10.3)
Chloride: 99 mmol/L (ref 98–111)
Creatinine, Ser: 3.75 mg/dL — ABNORMAL HIGH (ref 0.61–1.24)
GFR, Estimated: 16 mL/min — ABNORMAL LOW (ref >60)
Glucose, Bld: 89 mg/dL (ref 70–99)
Potassium: 3.3 mmol/L — ABNORMAL LOW (ref 3.5–5.1)
Sodium: 137 mmol/L (ref 135–145)

## 2022-09-15 LAB — HEPATITIS B SURFACE ANTIGEN: Hepatitis B Surface Ag: NONREACTIVE

## 2022-09-15 LAB — PROCALCITONIN: Procalcitonin: 4.31 ng/mL

## 2022-09-15 LAB — HEPATITIS C ANTIBODY: HCV Ab: NONREACTIVE

## 2022-09-15 LAB — HEMOGLOBIN AND HEMATOCRIT, BLOOD
HCT: 23 % — ABNORMAL LOW (ref 39.0–52.0)
Hemoglobin: 7.3 g/dL — ABNORMAL LOW (ref 13.0–17.0)

## 2022-09-15 LAB — HEPATITIS B SURFACE ANTIBODY, QUANTITATIVE: Hep B S AB Quant (Post): <3.1 m[IU]/mL — ABNORMAL LOW (ref >9.9)

## 2022-09-15 LAB — HEPATITIS B SURFACE ANTIBODY,QUALITATIVE: Hep B S Ab: NONREACTIVE

## 2022-09-15 LAB — HEPATITIS B CORE ANTIBODY, TOTAL: Hep B Core Total Ab: NONREACTIVE

## 2022-09-15 LAB — FOLATE: Folate: 6.7 ng/mL (ref >5.9)

## 2022-09-15 LAB — FERRITIN: Ferritin: 986 ng/mL — ABNORMAL HIGH (ref 24–336)

## 2022-09-15 LAB — VITAMIN B12: Vitamin B-12: 437 pg/mL (ref 180–914)

## 2022-09-15 MED ORDER — MELATONIN 3 MG PO TABS
6.0000 mg | ORAL_TABLET | Freq: Once | ORAL | Status: AC
  Administered 2022-09-15: 6 mg via ORAL
  Filled 2022-09-15: qty 2

## 2022-09-15 MED ORDER — ALLOPURINOL 100 MG PO TABS
100.0000 mg | ORAL_TABLET | Freq: Every day | ORAL | Status: DC
  Administered 2022-09-15 – 2022-09-16 (×2): 100 mg via ORAL
  Filled 2022-09-15 (×2): qty 1

## 2022-09-15 MED ORDER — CHLORHEXIDINE GLUCONATE CLOTH 2 % EX PADS
6.0000 | MEDICATED_PAD | Freq: Every day | CUTANEOUS | Status: DC
  Administered 2022-09-15 – 2022-09-16 (×2): 6 via TOPICAL

## 2022-09-15 MED ORDER — SODIUM CHLORIDE 0.9 % IV SOLN
250.0000 mg | Freq: Every day | INTRAVENOUS | Status: AC
  Administered 2022-09-15 – 2022-09-16 (×2): 250 mg via INTRAVENOUS
  Filled 2022-09-15: qty 15
  Filled 2022-09-15: qty 20

## 2022-09-15 MED ORDER — POLYETHYLENE GLYCOL 3350 17 G PO PACK
17.0000 g | PACK | Freq: Once | ORAL | Status: AC
  Administered 2022-09-15: 17 g via ORAL
  Filled 2022-09-15: qty 1

## 2022-09-15 MED ORDER — DEXTROSE IN LACTATED RINGERS 5 % IV SOLN: INTRAVENOUS | Status: AC

## 2022-09-15 NOTE — Plan of Care (Signed)
  Problem: Education: Goal: Knowledge of General Education information will improve Description Including pain rating scale, medication(s)/side effects and non-pharmacologic comfort measures Outcome: Progressing   Problem: Health Behavior/Discharge Planning: Goal: Ability to manage health-related needs will improve Outcome: Progressing   

## 2022-09-15 NOTE — Progress Notes (Signed)
Pt still inpatient at [facility]-  BP is stable off of BP meds-  received HD yesterday -  minimal UF  Dialysis Orders( as of last month) : Center: [facility]  on MWF . EDW 92 kg HD Bath 2K/2.5Ca  Time 4 hours Heparin none. Access RIJ TDC BFR 400 DFR 800     Will plan for HD tomorrow on schedule but may be possible since BP is better that pt could be discharged and sent to his OP unit tomorrow ??? Will assess in AM   [place]

## 2022-09-15 NOTE — Progress Notes (Signed)
Date and time results received: 09/15/22 4:38 PM  (use smartphrase ".now" to insert current time)  Test: CBG Critical Value: 60, rechecked 50  Name of Provider Notified: Dr. Heath Lark  Orders Received? Or Actions Taken?:  4oz. Juice given, rechecked 50 so 4 more oz of juice given

## 2022-09-15 NOTE — Progress Notes (Signed)
PROGRESS NOTE    [name]  EGB:[id number] DOB: December 04, 1946 DOA: 09/13/2022 PCP: Redmond School, MD   Brief Narrative:    [name] is a 76 y.o. male with medical history significant of end-stage renal disease on dialysis Monday, Wednesday, Friday.  Coronary artery disease with history of NSTEMI with multiple stents, type 2 diabetes, GERD, hypertension, vertigo.  Patient was admitted in the beginning of the month with acute cholecystitis.  As the patient was a poor surgical candidate, patient had a percutaneous cholecystostomy tube placed and the patient was sent home on antibiotics.  He was admitted with feeling lightheaded and was noted to have low blood pressure readings.  He has skipped his hemodialysis 9/22.  Home antihypertensives have been held and plans are for hemodialysis today.  Assessment & Plan:   Active Problems:   ESRD (end stage renal disease) (HCC)   (HFpEF) heart failure with preserved ejection fraction (HCC)   GERD (gastroesophageal reflux disease)   Diabetes mellitus type 2 in obese (HCC)   Acute cholecystitis   Sepsis ([place])   Hypotension  Assessment and Plan:   Hypotension-improved Continue to hold antihypertensives Discontinue further antibiotics as there appears to be no source of acute intra-abdominal infection Blood cultures with noted gram-positive cocci in 1 culture set, repeat today Recheck CBC in the morning If the patient needs pressor support, would use peripheral IV for the present.  Acute cholecystitis status post percutaneous cholecystostomy placement No purulent drainage from cholecystostomy tube Appreciate general surgery evaluation with no suspicion of infection at this time, plan to continue drain for at least 8 weeks and then consider cholecystostomy thereafter End-stage renal disease on dialysis Nephrology has performed some ultrafiltration on 9/23 Plan for further dialysis 9/25 Anemia, stable with iron deficiency.  Administer  Feraheme and monitor Heart failure with preserved EF Appears a little dehydrated at the present. Diabetes Continue insulin and SGLT2 Sliding scale insulin     DVT prophylaxis: Lovenox to SCDs Code Status: Full Family Communication: None at bedside Disposition Plan:  Status is: Inpatient Remains inpatient appropriate because: Need for close monitoring and IV medications.     Consultants:  General surgery Nephrology   Procedures:  None   Antimicrobials:  Anti-infectives (From admission, onward)    Start     Dose/Rate Route Frequency Ordered Stop   09/16/22 1200  vancomycin (VANCOCIN) IVPB 1000 mg/200 mL premix  Status:  Discontinued        1,000 mg 200 mL/hr over 60 Minutes Intravenous Every M-W-F (Hemodialysis) 09/13/22 1225 09/14/22 1132   09/14/22 1200  vancomycin (VANCOCIN) IVPB 1000 mg/200 mL premix  Status:  Discontinued        1,000 mg 200 mL/hr over 60 Minutes Intravenous Every T-Th-Sa (Hemodialysis) 09/13/22 1233 09/14/22 1132   09/14/22 1200  cefTRIAXone (ROCEPHIN) 2 g in sodium chloride 0.9 % 100 mL IVPB  Status:  Discontinued        2 g 200 mL/hr over 30 Minutes Intravenous Every 24 hours 09/13/22 2002 09/14/22 1132   09/13/22 1800  metroNIDAZOLE (FLAGYL) IVPB 500 mg  Status:  Discontinued        500 mg 100 mL/hr over 60 Minutes Intravenous Every 12 hours 09/13/22 1708 09/14/22 1132   09/13/22 1145  vancomycin (VANCOREADY) IVPB 2000 mg/400 mL        2,000 mg 200 mL/hr over 120 Minutes Intravenous  Once 09/13/22 1128 09/13/22 1447   09/13/22 1130  cefTRIAXone (ROCEPHIN) 1 g in sodium chloride 0.9 %  100 mL IVPB        1 g 200 mL/hr over 30 Minutes Intravenous  Once 09/13/22 1125 09/13/22 1240      Subjective: Patient seen and evaluated today with ongoing concerns of pain throughout his body.  No acute overnight events noted and blood pressures have remained stable.  He has had hemodialysis with minimal volume removal on 9/23.  Objective: Vitals:   09/15/22  0000 09/15/22 0200 09/15/22 0505 09/15/22 0725  BP: (!) 104/50 (!) 116/54    Pulse: (!) 133 77  79  Resp: 17 17  15   Temp:   99.4 F (37.4 C) 98.5 F (36.9 C)  TempSrc:   Oral Oral  SpO2: 97% (!) 88%  97%  Weight:      Height:        Intake/Output Summary (Last 24 hours) at 09/15/2022 0912 Last data filed at 09/15/2022 0217 Gross per 24 hour  Intake 480 ml  Output 900 ml  Net -420 ml   Filed Weights   09/13/22 0926  Weight: 99.8 kg    Examination:  General exam: Appears calm and comfortable  Respiratory system: Clear to auscultation. Respiratory effort normal. Cardiovascular system: S1 & S2 heard, RRR.  Gastrointestinal system: Abdomen is soft Central nervous system: Alert and awake Extremities: No edema Skin: No significant lesions noted Psychiatry: Flat affect.    Data Reviewed: I have personally reviewed following labs and imaging studies  CBC: Recent Labs  Lab 09/13/22 1029 09/14/22 0432 09/15/22 0403 09/15/22 0729  WBC 8.1 8.3 9.0  --   NEUTROABS 5.5  --   --   --   HGB 7.2* 7.5* 6.5* 7.3*  HCT 23.0* 23.8* 20.4* 23.0*  MCV 100.4* 99.6 100.0  --   PLT 234 250 237  --    Basic Metabolic Panel: Recent Labs  Lab 09/13/22 1029 09/14/22 0432  NA 138 138  K 3.6 3.3*  CL 97* 102  CO2 28 23  GLUCOSE 176* 123*  BUN 40* 47*  CREATININE 5.91* 6.16*  CALCIUM 9.1 8.8*  MG 2.1  --   PHOS 4.2  --    GFR: Estimated Creatinine Clearance: 12.7 mL/min (A) (by C-G formula based on SCr of 6.16 mg/dL (H)). Liver Function Tests: Recent Labs  Lab 09/13/22 1029 09/14/22 0432  AST 22 18  ALT 13 11  ALKPHOS 114 103  BILITOT 0.9 0.9  PROT 7.5 6.7  ALBUMIN 2.7* 2.5*   No results for input(s): "LIPASE", "AMYLASE" in the last 168 hours. No results for input(s): "AMMONIA" in the last 168 hours. Coagulation Profile: Recent Labs  Lab 09/13/22 1029  INR 1.1   Cardiac Enzymes: No results for input(s): "CKTOTAL", "CKMB", "CKMBINDEX", "TROPONINI" in the last  168 hours. BNP (last 3 results) No results for input(s): "PROBNP" in the last 8760 hours. HbA1C: No results for input(s): "HGBA1C" in the last 72 hours. CBG: Recent Labs  Lab 09/14/22 0741 09/14/22 1139 09/14/22 1612 09/14/22 2134 09/15/22 0724  GLUCAP 116* 84 89 107* 90   Lipid Profile: No results for input(s): "CHOL", "HDL", "LDLCALC", "TRIG", "CHOLHDL", "LDLDIRECT" in the last 72 hours. Thyroid Function Tests: No results for input(s): "TSH", "T4TOTAL", "FREET4", "T3FREE", "THYROIDAB" in the last 72 hours. Anemia Panel: Recent Labs    09/15/22 0743  VITAMINB12 437  FOLATE 6.7  FERRITIN 986*  TIBC 144*  IRON 13*  RETICCTPCT 2.0   Sepsis Labs: Recent Labs  Lab 09/13/22 1704 09/14/22 0432  PROCALCITON 9.20 6.20  Recent Results (from the past 240 hour(s))  Blood culture (routine x 2)     Status: None (Preliminary result)   Collection Time: 09/13/22 11:33 AM   Specimen: BLOOD  Result Value Ref Range Status   Specimen Description   Final    BLOOD RIGHT HAND Performed at [facility], [address] [place], [place] [address]    Special Requests   Final    BOTTLES DRAWN AEROBIC AND ANAEROBIC Blood Culture results may not be optimal due to an excessive volume of blood received in culture bottles Performed at [facility], [address], [place], [place] [address]    Culture  Setup Time   Final    GRAM POSITIVE COCCI AEROBIC BOTTLE ONLY Gram Stain Report Called to,Read Back By and Verified With: HYLTON L @ 3220 ON [id number] BY HENDERSON L Performed at [facility], [address] [place], [place] [address]    Culture [place]  Final   Report Status PENDING  Incomplete  Blood culture (routine x 2)     Status: None (Preliminary result)   Collection Time: 09/13/22 11:45 AM   Specimen: BLOOD  Result Value Ref Range Status   Specimen Description BLOOD LEFT HAND  Final   Special Requests   Final    BOTTLES DRAWN AEROBIC ONLY Blood Culture results may not  be optimal due to an excessive volume of blood received in culture bottles   Culture   Final    NO GROWTH 2 DAYS Performed at [facility], [address], [place], [place] [address]    Report Status PENDING  Incomplete  MRSA Next Gen by PCR, Nasal     Status: Abnormal   Collection Time: 09/13/22  8:03 PM   Specimen: Nasal Mucosa; Nasal Swab  Result Value Ref Range Status   MRSA by PCR Next Gen DETECTED (A) NOT DETECTED Final    Comment: RESULT CALLED TO, READ BACK BY AND VERIFIED WITH: KINDLEY,J ON 09/14/2022 @ 0233 BY ACOSTA,A Performed at [facility], [address], [place], [place] [address]   Culture, blood (Routine X 2) w Reflex to ID Panel     Status: None (Preliminary result)   Collection Time: 09/15/22  7:35 AM   Specimen: BLOOD LEFT FOREARM  Result Value Ref Range Status   Specimen Description   Final    BLOOD LEFT FOREARM BOTTLES DRAWN AEROBIC AND ANAEROBIC   Special Requests   Final    Blood Culture results may not be optimal due to an excessive volume of blood received in culture bottles Performed at [facility], [address], [place], [place] [address]    Culture PENDING  Incomplete   Report Status PENDING  Incomplete  Culture, blood (Routine X 2) w Reflex to ID Panel     Status: None (Preliminary result)   Collection Time: 09/15/22  7:43 AM   Specimen: BLOOD LEFT HAND  Result Value Ref Range Status   Specimen Description   Final    BLOOD LEFT HAND BOTTLES DRAWN AEROBIC AND ANAEROBIC   Special Requests   Final    Blood Culture results may not be optimal due to an excessive volume of blood received in culture bottles Performed at [facility], [address], [place], [place] [address]    Culture PENDING  Incomplete   Report Status PENDING  Incomplete         Radiology Studies: CT ABDOMEN PELVIS WO CONTRAST  Result Date: 09/13/2022 CLINICAL DATA:  Postoperative abdominal pain.  Dizziness. On dialysis for 6-7 weeks. EXAM: CT ABDOMEN AND PELVIS WITHOUT  CONTRAST TECHNIQUE: Multidetector CT imaging of the abdomen and pelvis was performed following the standard protocol without IV contrast. RADIATION DOSE REDUCTION: This exam was performed according to the departmental dose-optimization program which includes automated exposure control, adjustment of the mA and/or kV according to patient size and/or use of iterative reconstruction technique. COMPARISON:  CT abdomen dated 08/19/2022. FINDINGS: Lower chest: Mild bibasilar atelectasis. Hepatobiliary: Percutaneous cholecystostomy tube is now in place. Gallbladder is appropriately decompressed. Mild residual pericholecystic inflammation. No evidence of pericholecystic abscess collection or other circumscribed fluid collection in the surrounding RIGHT upper abdomen. Scattered hypodense lesions within the bilateral liver lobes, difficult to characterize without intravascular contrast, but not significantly changed compared to an earlier CT of 12/14/2016 suggesting benignity, presumably small cysts and/or hemangiomas. No follow-up imaging is needed for this benign finding. No new or suspicious findings within the liver. No evidence of bile duct dilatation. Pancreas: Partially infiltrated with fat but otherwise unremarkable. Spleen: Normal in size without focal abnormality. Adrenals/Urinary Tract: Adrenal glands appear normal. Kidneys are unremarkable without suspicious mass, stone or hydronephrosis. Bladder is unremarkable. Stomach/Bowel: No dilated large or small bowel loops. Fairly large amount of stool within the colon. No evidence of focal acute bowel wall inflammation. Stomach is unremarkable, partially decompressed. Vascular/Lymphatic: Aortic atherosclerosis. No abdominal aortic aneurysm. No enlarged lymph nodes are identified within the abdomen or pelvis. Reproductive: Prostate is mildly enlarged, unchanged. Other: No free fluid or abscess collection is seen within the abdomen or pelvis. No free intraperitoneal air.  Musculoskeletal: No acute-appearing osseous abnormality. Fixation hardware within the lumbar spine appears appropriately positioned. Superficial soft tissues of the abdomen and pelvis are unremarkable. IMPRESSION: 1. Percutaneous cholecystostomy tube is now in place. Gallbladder is appropriately decompressed. Mild residual pericholecystic inflammation. No evidence of pericholecystic abscess collection, hematoma or other complicating features. 2. Fairly large amount of stool within the colon (constipation?). 3. No free fluid or abscess collection is seen within the abdomen or pelvis. No free intraperitoneal air. 4. Mildly enlarged prostate gland. Aortic Atherosclerosis (ICD10-I70.0). Electronically Signed   By: Franki Cabot M.D.   On: 09/13/2022 14:47   DG Chest 1 View  Result Date: 09/13/2022 CLINICAL DATA:  Weakness, dizziness EXAM: CHEST  1 VIEW COMPARISON:  Radiograph 08/19/2022 FINDINGS: Right neck catheter tip overlies the right atrium. Unchanged mildly enlarged cardiac silhouette. There is no focal airspace consolidation. There is no pleural effusion. No pneumothorax. A spinal cord stimulator overlies the lower thoracic spine. IMPRESSION: No evidence of acute cardiopulmonary disease. Electronically Signed   By: Maurine Simmering M.D.   On: 09/13/2022 11:06        Scheduled Meds:  acarbose  50 mg Oral TID WC   allopurinol  100 mg Oral Daily   Chlorhexidine Gluconate Cloth  6 each Topical Q0600   Chlorhexidine Gluconate Cloth  6 each Topical Q0600   cycloSPORINE  1 drop Both Eyes BID   insulin aspart  0-15 Units Subcutaneous TID WC   insulin aspart  0-5 Units Subcutaneous QHS   insulin NPH Human  18 Units Subcutaneous Q breakfast   linagliptin  5 mg Oral Daily   mupirocin ointment  1 Application Nasal BID   polyethylene glycol  17 g Oral Once   pravastatin  40 mg Oral QPM   Continuous Infusions:  anticoagulant sodium citrate     ferric gluconate (FERRLECIT) IVPB       LOS: 2 days  Time spent: 35 minutes    Ishani Goldwasser Darleen Crocker, DO [facility]  If 7PM-7AM, please contact night-coverage [email or web address] 09/15/2022, 9:12 AM

## 2022-09-16 LAB — CBC
HCT: 22.2 % — ABNORMAL LOW (ref 39.0–52.0)
Hemoglobin: 7 g/dL — ABNORMAL LOW (ref 13.0–17.0)
MCH: 31.8 pg (ref 26.0–34.0)
MCHC: 31.5 g/dL (ref 30.0–36.0)
MCV: 100.9 fL — ABNORMAL HIGH (ref 80.0–100.0)
Platelets: 235 10*3/uL (ref 150–400)
RBC: 2.2 MIL/uL — ABNORMAL LOW (ref 4.22–5.81)
RDW: 15.7 % — ABNORMAL HIGH (ref 11.5–15.5)
WBC: 9.4 10*3/uL (ref 4.0–10.5)
nRBC: 0 % (ref 0.0–0.2)

## 2022-09-16 LAB — CULTURE, BLOOD (ROUTINE X 2)

## 2022-09-16 LAB — PREPARE RBC (CROSSMATCH)

## 2022-09-16 LAB — HEMOGLOBIN AND HEMATOCRIT, BLOOD
HCT: 22.9 % — ABNORMAL LOW (ref 39.0–52.0)
Hemoglobin: 7.5 g/dL — ABNORMAL LOW (ref 13.0–17.0)

## 2022-09-16 LAB — BASIC METABOLIC PANEL
Anion gap: 9 (ref 5–15)
BUN: 35 mg/dL — ABNORMAL HIGH (ref 8–23)
CO2: 28 mmol/L (ref 22–32)
Calcium: 8.9 mg/dL (ref 8.9–10.3)
Chloride: 101 mmol/L (ref 98–111)
Creatinine, Ser: 4.61 mg/dL — ABNORMAL HIGH (ref 0.61–1.24)
GFR, Estimated: 12 mL/min — ABNORMAL LOW (ref >60)
Glucose, Bld: 115 mg/dL — ABNORMAL HIGH (ref 70–99)
Potassium: 3.4 mmol/L — ABNORMAL LOW (ref 3.5–5.1)
Sodium: 138 mmol/L (ref 135–145)

## 2022-09-16 LAB — GLUCOSE, CAPILLARY
Glucose-Capillary: 122 mg/dL — ABNORMAL HIGH (ref 70–99)
Glucose-Capillary: 110 mg/dL — ABNORMAL HIGH (ref 70–99)

## 2022-09-16 LAB — MAGNESIUM: Magnesium: 1.7 mg/dL (ref 1.7–2.4)

## 2022-09-16 MED ORDER — MECLIZINE HCL 12.5 MG PO TABS: 12.5000 mg | ORAL_TABLET | Freq: Three times a day (TID) | ORAL | 0 refills | Status: DC | PRN

## 2022-09-16 MED ORDER — FERROUS SULFATE 325 (65 FE) MG PO TABS: 325.0000 mg | ORAL_TABLET | Freq: Every day | ORAL | 3 refills | Status: DC

## 2022-09-16 MED ORDER — DOCUSATE SODIUM 100 MG PO CAPS: 100.0000 mg | ORAL_CAPSULE | Freq: Two times a day (BID) | ORAL | 2 refills | Status: DC

## 2022-09-16 MED ORDER — INSULIN NPH (HUMAN) (ISOPHANE) 100 UNIT/ML ~~LOC~~ SUSP: 10.0000 [IU] | SUBCUTANEOUS | 11 refills | Status: DC

## 2022-09-16 MED ORDER — HEPARIN SODIUM (PORCINE) 1000 UNIT/ML IJ SOLN
INTRAMUSCULAR | Status: AC
  Administered 2022-09-16: 3800 [IU]
  Filled 2022-09-16: qty 6

## 2022-09-16 MED ORDER — SODIUM CHLORIDE 0.9% IV SOLUTION: Freq: Once | INTRAVENOUS | Status: AC

## 2022-09-16 MED ORDER — INSULIN NPH (HUMAN) (ISOPHANE) 100 UNIT/ML ~~LOC~~ SUSP
12.0000 [IU] | Freq: Every day | SUBCUTANEOUS | Status: DC
  Filled 2022-09-16: qty 10

## 2022-09-16 NOTE — Progress Notes (Signed)
Patient alert and oriented x4. No current complaints of pain, shortness of breath, chest pain, dizziness, nausea or vomiting. Patient tolerated PO meds and diet well. Appetite fair. Dr Manuella Ghazi aware. Dialysis cath dressing clean, dry and intact at time of discharge. IV removed with no complications. Discharge summary went over with patient by SWOT RN and patient discharged home with all belongings via car.

## 2022-09-16 NOTE — Care Management Important Message (Signed)
Important Message  Patient Details  Name: [name] MRN: [id number] Date of Birth: 1946/10/11   Medicare Important Message Given:  Yes (late entry, copy provided 09/14/22 2:30pm)     Tommy Medal 09/16/2022, 4:44 PM

## 2022-09-16 NOTE — Discharge Summary (Addendum)
Physician Discharge Summary  [name] AUQ:[id number] DOB: Dec 12, 1946 DOA: 09/13/2022  PCP: Redmond School, MD  Admit date: 09/13/2022  Discharge date: 09/16/2022  Admitted From:Home  Disposition:  Home  Recommendations for Outpatient Follow-up:  Follow up with PCP in 1-2 weeks Follow-up with Dr. Constance Haw, general surgery 10/3 as scheduled for evaluation of cholecystostomy tube Continue routine hemodialysis MWF as prior with last hemodialysis 9/25 prior to discharge Continue iron supplementation for iron deficiency anemia and Colace prescribed Hold home antihypertensives for now Decreased home insulin dose to 10 units daily due to issues with hypoglycemia Meclizine added as needed for intermittent dizziness/vertigo symptoms  Home Health: Yes with PT/OT  Equipment/Devices: None  Discharge Condition:Stable  CODE STATUS: Full  Diet recommendation: Renal/carb modified  Brief/Interim Summary: [name] is a 76 y.o. male with medical history significant of end-stage renal disease on dialysis Monday, Wednesday, Friday.  Coronary artery disease with history of NSTEMI with multiple stents, type 2 diabetes, GERD, hypertension, vertigo.  Patient was admitted in the beginning of the month with acute cholecystitis.  As the patient was a poor surgical candidate, patient had a percutaneous cholecystostomy tube placed and the patient was sent home on antibiotics.  He was admitted with feeling lightheaded and was noted to have low blood pressure readings.  He has skipped his hemodialysis 9/22.  Home antihypertensives have been held during the course of this admission with stable and improved blood pressure readings.  He was noted to have some worsening anemia with iron deficiency and therefore received IV iron supplementation as well as a unit of PRBC on 9/25 with hemodialysis.  He was noted to have some mild hypoglycemia as well for which his home insulin dose has been decreased.  He is now  tolerating hemodialysis well and is in stable condition for discharge with medication adjustments as noted.  He is noted to have some iron deficiency for which she was started on home iron supplementation.  No other acute events noted throughout the course of the stay and he is stable for discharge.  Discharge Diagnoses:  Active Problems:   ESRD (end stage renal disease) (HCC)   (HFpEF) heart failure with preserved ejection fraction (HCC)   GERD (gastroesophageal reflux disease)   Diabetes mellitus type 2 in obese ([place])   Acute cholecystitis   Sepsis ([place])   Hypotension  Principal discharge diagnosis: Hypotension in the setting of ESRD with HD, sepsis ruled out.  Hypoglycemia with history of insulin-dependent type 2 diabetes.  Discharge Instructions  Discharge Instructions     Diet - low sodium heart healthy   Complete by: As directed    Increase activity slowly   Complete by: As directed    No wound care   Complete by: As directed       Allergies as of 09/16/2022       Reactions   Metformin Nausea And Vomiting   Niacin    Niacin And Related Other (See Comments)   Reaction: flushing and burning side effects even with extended release        Medication List     STOP taking these medications    amLODipine 10 MG tablet Commonly known as: NORVASC   amoxicillin-clavulanate 500-125 MG tablet Commonly known as: AUGMENTIN   carvedilol 6.25 MG tablet Commonly known as: COREG   cloNIDine 0.1 MG tablet Commonly known as: CATAPRES   hydrALAZINE 100 MG tablet Commonly known as: APRESOLINE   isosorbide mononitrate 60 MG 24 hr tablet Commonly known  as: IMDUR       TAKE these medications    acarbose 50 MG tablet Commonly known as: PRECOSE Take 50 mg by mouth in the morning, at noon, and at bedtime.   acetaminophen 500 MG tablet Commonly known as: TYLENOL Take 2 tablets (1,000 mg total) by mouth every 6 (six) hours.   allopurinol 100 MG tablet Commonly known  as: ZYLOPRIM Take 100 mg by mouth daily.   ascorbic acid 500 MG tablet Commonly known as: VITAMIN C Take 500 mg by mouth daily.   aspirin EC 81 MG tablet Take 1 tablet (81 mg total) by mouth daily.   calcitRIOL 0.25 MCG capsule Commonly known as: ROCALTROL Take 0.25 mcg by mouth every Sunday.   carboxymethylcellulose 0.5 % Soln Commonly known as: REFRESH PLUS Place 1 drop into both eyes daily as needed (dry eyes).   Dialyvite 800-Zinc 15 0.8 MG Tabs Take 1 tablet by mouth daily.   docusate sodium 100 MG capsule Commonly known as: Colace Take 1 capsule (100 mg total) by mouth 2 (two) times daily.   epoetin alfa 3000 UNIT/ML injection Commonly known as: EPOGEN Inject into the skin.   ferrous sulfate 325 (65 FE) MG tablet Take 1 tablet (325 mg total) by mouth daily.   insulin NPH Human 100 UNIT/ML injection Commonly known as: NovoLIN N Inject 0.1 mLs (10 Units total) into the skin every morning. Ands syringes 1/day What changed: how much to take   Januvia 25 MG tablet Generic drug: sitaGLIPtin Take 25 mg by mouth daily.   meclizine 12.5 MG tablet Commonly known as: ANTIVERT Take 12.5 mg by mouth 3 (three) times daily as needed for dizziness. What changed: Another medication with the same name was added. Make sure you understand how and when to take each.   meclizine 12.5 MG tablet Commonly known as: ANTIVERT Take 1 tablet (12.5 mg total) by mouth 3 (three) times daily as needed for dizziness. What changed: You were already taking a medication with the same name, and this prescription was added. Make sure you understand how and when to take each.   melatonin 5 MG Tabs Take 5 mg by mouth at bedtime.   nitroGLYCERIN 0.4 MG SL tablet Commonly known as: NITROSTAT Place 1 tablet (0.4 mg total) under the tongue every 5 (five) minutes as needed for chest pain. PLACE ONE TABLET UNDER THE TONGUE EVERY 5 MINUTES FOR 3 DOSES AS NEEDED Strength: 0.4 mg   polyethylene glycol  17 g packet Commonly known as: MIRALAX / GLYCOLAX Take 17 g by mouth daily.   pravastatin 40 MG tablet Commonly known as: PRAVACHOL Take 1 tablet (40 mg total) by mouth every evening.   promethazine 25 MG suppository Commonly known as: PHENERGAN Place 25 mg rectally every 12 (twelve) hours as needed for nausea or vomiting.   Restasis 0.05 % ophthalmic emulsion Generic drug: cycloSPORINE Place 1 drop into both eyes 2 (two) times daily.   senna 8.6 MG Tabs tablet Commonly known as: SENOKOT Take 2 tablets (17.2 mg total) by mouth 2 (two) times daily.   simethicone 80 MG chewable tablet Commonly known as: MYLICON Chew 80 mg by mouth every 6 (six) hours as needed for flatulence.   traMADol 50 MG tablet Commonly known as: ULTRAM Take 50 mg by mouth 4 (four) times daily as needed for pain.   True Metrix Blood Glucose Test test strip Generic drug: glucose blood USE TO TEST BLOOD SUGAR UP TO TWICE DAILY OR AS DIRECTED  Zinc 50 MG Tabs Take 1 tablet by mouth daily.        Follow-up Information     Redmond School, MD. Schedule an appointment as soon as possible for a visit in 1 week(s).   Specialty: Internal Medicine Contact information: [address] [place] [address] [phone]         [facility].   Specialty: Home Health Services               Allergies  Allergen Reactions   Metformin Nausea And Vomiting   Niacin    Niacin And Related Other (See Comments)    Reaction: flushing and burning side effects even with extended release    Consultations: Nephrology General surgery   Procedures/Studies: CT ABDOMEN PELVIS WO CONTRAST  Result Date: 09/13/2022 CLINICAL DATA:  Postoperative abdominal pain. Dizziness. On dialysis for 6-7 weeks. EXAM: CT ABDOMEN AND PELVIS WITHOUT CONTRAST TECHNIQUE: Multidetector CT imaging of the abdomen and pelvis was performed following the standard protocol without IV contrast.  RADIATION DOSE REDUCTION: This exam was performed according to the departmental dose-optimization program which includes automated exposure control, adjustment of the mA and/or kV according to patient size and/or use of iterative reconstruction technique. COMPARISON:  CT abdomen dated 08/19/2022. FINDINGS: Lower chest: Mild bibasilar atelectasis. Hepatobiliary: Percutaneous cholecystostomy tube is now in place. Gallbladder is appropriately decompressed. Mild residual pericholecystic inflammation. No evidence of pericholecystic abscess collection or other circumscribed fluid collection in the surrounding RIGHT upper abdomen. Scattered hypodense lesions within the bilateral liver lobes, difficult to characterize without intravascular contrast, but not significantly changed compared to an earlier CT of 12/14/2016 suggesting benignity, presumably small cysts and/or hemangiomas. No follow-up imaging is needed for this benign finding. No new or suspicious findings within the liver. No evidence of bile duct dilatation. Pancreas: Partially infiltrated with fat but otherwise unremarkable. Spleen: Normal in size without focal abnormality. Adrenals/Urinary Tract: Adrenal glands appear normal. Kidneys are unremarkable without suspicious mass, stone or hydronephrosis. Bladder is unremarkable. Stomach/Bowel: No dilated large or small bowel loops. Fairly large amount of stool within the colon. No evidence of focal acute bowel wall inflammation. Stomach is unremarkable, partially decompressed. Vascular/Lymphatic: Aortic atherosclerosis. No abdominal aortic aneurysm. No enlarged lymph nodes are identified within the abdomen or pelvis. Reproductive: Prostate is mildly enlarged, unchanged. Other: No free fluid or abscess collection is seen within the abdomen or pelvis. No free intraperitoneal air. Musculoskeletal: No acute-appearing osseous abnormality. Fixation hardware within the lumbar spine appears appropriately positioned.  Superficial soft tissues of the abdomen and pelvis are unremarkable. IMPRESSION: 1. Percutaneous cholecystostomy tube is now in place. Gallbladder is appropriately decompressed. Mild residual pericholecystic inflammation. No evidence of pericholecystic abscess collection, hematoma or other complicating features. 2. Fairly large amount of stool within the colon (constipation?). 3. No free fluid or abscess collection is seen within the abdomen or pelvis. No free intraperitoneal air. 4. Mildly enlarged prostate gland. Aortic Atherosclerosis (ICD10-I70.0). Electronically Signed   By: Franki Cabot M.D.   On: 09/13/2022 14:47   DG Chest 1 View  Result Date: 09/13/2022 CLINICAL DATA:  Weakness, dizziness EXAM: CHEST  1 VIEW COMPARISON:  Radiograph 08/19/2022 FINDINGS: Right neck catheter tip overlies the right atrium. Unchanged mildly enlarged cardiac silhouette. There is no focal airspace consolidation. There is no pleural effusion. No pneumothorax. A spinal cord stimulator overlies the lower thoracic spine. IMPRESSION: No evidence of acute cardiopulmonary disease. Electronically Signed   By: Maurine Simmering M.D.   On: 09/13/2022  11:06   IR Perc Cholecystostomy  Result Date: 08/20/2022 INDICATION: 76 year old male with acute calculus cholecystitis, poor surgical candidate. EXAM: Ultrasound and fluoroscopic guided percutaneous cholecystostomy placement MEDICATIONS: 2.25 g Zosyn, intravenous; The antibiotic was administered within an appropriate time frame prior to the initiation of the procedure. ANESTHESIA/SEDATION: Moderate (conscious) sedation was employed during this procedure. A total of Versed 1 mg and Fentanyl 50 mcg was administered intravenously. Moderate Sedation Time: 11 minutes. The patient's level of consciousness and vital signs were monitored continuously by radiology nursing throughout the procedure under my direct supervision. FLUOROSCOPY TIME:  Two mGy COMPLICATIONS: None immediate. PROCEDURE:  Informed written consent was obtained from the patient after a thorough discussion of the procedural risks, benefits and alternatives. All questions were addressed. Maximal Sterile Barrier Technique was utilized including caps, mask, sterile gowns, sterile gloves, sterile drape, hand hygiene and skin antiseptic. A timeout was performed prior to the initiation of the procedure. The patient was placed supine on the angiographic table. The patient's right upper quadrant was then prepped and draped in normal sterile fashion with maximum sterile barrier. Ultrasound demonstrates a distended gallbladder with multiple internal echoes compatible with cholelithiasis. Subdermal Local anesthesia was provided at the planned skin entry site. Under ultrasound guidance, deeper local anesthetic was provided through intercostal muscles and along the liver capsule. Ultrasound was used to puncture the gallbladder using a 18 gauge trocar needle via a subhepatic/trans peritoneal approach with visualization of the lung treated to the gallbladder. An Amplatz wire was advanced into the lumen and a transition dilator placed. A gentle hand injection of contrast was performed. Cholecystogram demonstrates irregular filling defects consistent with gallstones. The cystic duct is obstructed. A 0.035 inch exchange wire was placed in the tract was dilated. A 10 French multipurpose drainage catheter was advanced into the gallbladder lumen. The drain was then secured in place using a 0-silk suture and a Stayfix device. A sterile dressing was applied. The tube was placed to bag drainage. A culture was sent to the lab for analysis. The patient tolerated procedure well without evidence of immediate complication was transferred back to the floor in stable condition. IMPRESSION: Successful placement of percutaneous, subhepatic/transperitoneal cholecystostomy tube. PLAN: Pending candidacy for cholecystectomy, plan for return in 8 weeks for initial routine  cholecystostomy tube check and exchange. If deemed not a surgical candidate, the patient will be evaluated for possible candidacy for percutaneous choledochoscopic-assisted (Spyglass) gallstone retrieval and hopeful of eventual tube removal. Ruthann Cancer, MD [facility] Electronically Signed   By: Ruthann Cancer M.D.   On: 08/20/2022 14:02   US Abdomen Limited RUQ (LIVER/GB)  Result Date: 08/20/2022 CLINICAL DATA:  Choledocholithiasis EXAM: ULTRASOUND ABDOMEN LIMITED RIGHT UPPER QUADRANT COMPARISON:  CT abdomen/pelvis dated 08/19/2022 FINDINGS: Gallbladder: Cholelithiasis with distension, gallbladder wall thickening, pericholecystic fluid, and positive sonographic Murphy's sign. Common bile duct: Diameter: 4 mm Liver: The upper limits of normal for parenchymal echogenicity. 9 mm hyperechoic lesion in the anterior right hepatic lobe, possibly reflecting a small hemangioma portal vein is patent on color Doppler imaging with normal direction of blood flow towards the liver. Other: None. IMPRESSION: Cholelithiasis with suspected acute cholecystitis. No intrahepatic or extrahepatic ductal dilatation. Common duct measures 4 mm. Electronically Signed   By: Julian Hy M.D.   On: 08/20/2022 01:33   CT ABDOMEN PELVIS WO CONTRAST  Result Date: 08/19/2022 CLINICAL DATA:  emesis and abdominal pain since yesterday. EXAM: CT ABDOMEN AND PELVIS WITHOUT CONTRAST TECHNIQUE: Multidetector CT imaging of  the abdomen and pelvis was performed following the standard protocol without IV contrast. RADIATION DOSE REDUCTION: This exam was performed according to the departmental dose-optimization program which includes automated exposure control, adjustment of the mA and/or kV according to patient size and/or use of iterative reconstruction technique. COMPARISON:  CT May 22, 2021. FINDINGS: Lower chest: Scarring/atelectasis in the bilateral lung bases. Hepatobiliary:  Scattered bilobar hypodense hepatic lesions are technically too small to accurately characterize an incompletely evaluated on this noncontrast examination but appears similar prior and statistically likely to reflect benign cysts or hemangiomas. Gallbladder is distended with pericholecystic fluid and gallbladder wall thickening consistent with cholecystitis. No biliary ductal dilation. Pancreas: No pancreatic ductal dilation or evidence of acute inflammation. Spleen: Normal in size without focal abnormality. Adrenals/Urinary Tract: Bilateral adrenal glands appear normal. No hydronephrosis. Bilateral hypodense renal lesions measure fluid density consistent with cysts and considered benign requiring no independent imaging follow-up. Additional bilateral hypodense renal lesions are technically too small to accurately characterize but statistically likely to reflect cysts and considered benign requiring no independent imaging follow-up. Urinary bladder is unremarkable for degree of distension. Stomach/Bowel: No radiopaque enteric contrast material was administered. Stomach is minimally distended limiting evaluation. No pathologic dilation of large or small bowel. Appendix is not confidently identified however there is no pericecal inflammation. Vascular/Lymphatic: Aortic atherosclerosis. No pathologically enlarged abdominal or pelvic lymph nodes. Reproductive: Prostate is mildly enlarged, unchanged. Other: No significant abdominopelvic free fluid. Musculoskeletal: Spinal stimulating generator in the posterior paramedian right lumbar spine soft tissues. Lumbosacral fixation hardware. Multilevel degenerative changes spine. IMPRESSION: 1. Acute cholecystitis, without evidence of perforation. 2. No evidence of bowel obstruction. 3.  Aortic Atherosclerosis (ICD10-I70.0). Electronically Signed   By: Dahlia Bailiff M.D.   On: 08/19/2022 08:17   DG Chest Port 1 View  Result Date: 08/19/2022 CLINICAL DATA:  Sepsis. EXAM:  PORTABLE CHEST 1 VIEW COMPARISON:  August 06, 2022. FINDINGS: Stable cardiomegaly. Right internal jugular dialysis catheter is unchanged in position. Both lungs are clear. The visualized skeletal structures are unremarkable. IMPRESSION: No active disease. Electronically Signed   By: Marijo Conception M.D.   On: 08/19/2022 08:16     Discharge Exam: Vitals:   09/16/22 1300 09/16/22 1330  BP: (!) 150/56 (!) 153/47  Pulse:    Resp: 13 15  Temp:    SpO2:     Vitals:   09/16/22 1200 09/16/22 1230 09/16/22 1300 09/16/22 1330  BP: (!) 140/81 (!) 141/56 (!) 150/56 (!) 153/47  Pulse:      Resp: 15 14 13 15   Temp:      TempSrc:      SpO2:      Weight:      Height:        General: Pt is alert, awake, not in acute distress Cardiovascular: RRR, S1/S2 +, no rubs, no gallops Respiratory: CTA bilaterally, no wheezing, no rhonchi Abdominal: Soft, NT, ND, bowel sounds + Extremities: no edema, no cyanosis    The results of significant diagnostics from this hospitalization (including imaging, microbiology, ancillary and laboratory) are listed below for reference.     Microbiology: Recent Results (from the past 240 hour(s))  Blood culture (routine x 2)     Status: Abnormal   Collection Time: 09/13/22 11:33 AM   Specimen: BLOOD  Result Value Ref Range Status   Specimen Description   Final    BLOOD RIGHT HAND Performed at [facility], [address], [place], [place] [address]    Special Requests  Final    BOTTLES DRAWN AEROBIC AND ANAEROBIC Blood Culture results may not be optimal due to an excessive volume of blood received in culture bottles Performed at [facility], [address] [place], [place] [address]    Culture  Setup Time   Final    GRAM POSITIVE COCCI AEROBIC BOTTLE ONLY Gram Stain Report Called to,Read Back By and Verified With: HYLTON L @ 5809 ON [id number] BY HENDERSON L Performed at [facility], [address] [place], [place] [address]    Culture (A)  Final     STAPHYLOCOCCUS EPIDERMIDIS THE SIGNIFICANCE OF ISOLATING THIS ORGANISM FROM A SINGLE SET OF BLOOD CULTURES WHEN MULTIPLE SETS ARE DRAWN IS UNCERTAIN. PLEASE NOTIFY THE MICROBIOLOGY DEPARTMENT WITHIN ONE WEEK IF SPECIATION AND SENSITIVITIES ARE REQUIRED. Performed at [facility], [place] [address], [place], [place] [address]    Report Status 09/16/2022 FINAL  Final  Blood culture (routine x 2)     Status: None (Preliminary result)   Collection Time: 09/13/22 11:45 AM   Specimen: BLOOD  Result Value Ref Range Status   Specimen Description BLOOD LEFT HAND  Final   Special Requests   Final    BOTTLES DRAWN AEROBIC ONLY Blood Culture results may not be optimal due to an excessive volume of blood received in culture bottles   Culture   Final    NO GROWTH 2 DAYS Performed at [facility] [address] [place], [place] [address]    Report Status PENDING  Incomplete  MRSA Next Gen by PCR, Nasal     Status: Abnormal   Collection Time: 09/13/22  8:03 PM   Specimen: Nasal Mucosa; Nasal Swab  Result Value Ref Range Status   MRSA by PCR Next Gen DETECTED (A) NOT DETECTED Final    Comment: RESULT CALLED TO, READ BACK BY AND VERIFIED WITH: KINDLEY,J ON 09/14/2022 @ 0233 BY ACOSTA,A Performed at [facility], [address] [place], [place] [address]   Culture, blood (Routine X 2) w Reflex to ID Panel     Status: None (Preliminary result)   Collection Time: 09/15/22  7:35 AM   Specimen: BLOOD LEFT FOREARM  Result Value Ref Range Status   Specimen Description   Final    BLOOD LEFT FOREARM BOTTLES DRAWN AEROBIC AND ANAEROBIC   Special Requests   Final    Blood Culture results may not be optimal due to an excessive volume of blood received in culture bottles   Culture   Final    NO GROWTH < 24 HOURS Performed at [facility], [address], [place], [place] [address]    Report Status PENDING  Incomplete  Culture, blood (Routine X 2) w Reflex to ID Panel     Status: None (Preliminary result)    Collection Time: 09/15/22  7:43 AM   Specimen: BLOOD LEFT HAND  Result Value Ref Range Status   Specimen Description   Final    BLOOD LEFT HAND BOTTLES DRAWN AEROBIC AND ANAEROBIC   Special Requests   Final    Blood Culture results may not be optimal due to an excessive volume of blood received in culture bottles   Culture   Final    NO GROWTH < 24 HOURS Performed at [facility], [address], [place],  [place] [address]    Report Status PENDING  Incomplete     Labs: BNP (last 3 results) Recent Labs    08/08/22 0425 08/09/22 0511 08/10/22 0602  BNP 139.0* 170.0* 132.0*  Basic Metabolic Panel: Recent Labs  Lab 09/13/22 1029 09/14/22 0432 09/15/22 0403 09/16/22 0442  NA 138 138 137 138  K 3.6 3.3* 3.3* 3.4*  CL 97* 102 99 101  CO2 28 23 28 28   GLUCOSE 176* 123* 89 115*  BUN 40* 47* 25* 35*  CREATININE 5.91* 6.16* 3.75* 4.61*  CALCIUM 9.1 8.8* 8.7* 8.9  MG 2.1  --  1.7 1.7  PHOS 4.2  --   --   --    Liver Function Tests: Recent Labs  Lab 09/13/22 1029 09/14/22 0432  AST 22 18  ALT 13 11  ALKPHOS 114 103  BILITOT 0.9 0.9  PROT 7.5 6.7  ALBUMIN 2.7* 2.5*   No results for input(s): "LIPASE", "AMYLASE" in the last 168 hours. No results for input(s): "AMMONIA" in the last 168 hours. CBC: Recent Labs  Lab 09/13/22 1029 09/14/22 0432 09/15/22 0403 09/15/22 0729 09/16/22 0442 09/16/22 1317  WBC 8.1 8.3 9.0  --  9.4  --   NEUTROABS 5.5  --   --   --   --   --   HGB 7.2* 7.5* 6.5* 7.3* 7.0* 7.5*  HCT 23.0* 23.8* 20.4* 23.0* 22.2* 22.9*  MCV 100.4* 99.6 100.0  --  100.9*  --   PLT 234 250 237  --  235  --    Cardiac Enzymes: No results for input(s): "CKTOTAL", "CKMB", "CKMBINDEX", "TROPONINI" in the last 168 hours. BNP: Invalid input(s): "POCBNP" CBG: Recent Labs  Lab 09/15/22 1633 09/15/22 1805 09/15/22 2117 09/16/22 0718 09/16/22 1151  GLUCAP 50* 142* 109* 122* 110*   D-Dimer No results for input(s): "DDIMER" in the last 72 hours. Hgb  A1c No results for input(s): "HGBA1C" in the last 72 hours. Lipid Profile No results for input(s): "CHOL", "HDL", "LDLCALC", "TRIG", "CHOLHDL", "LDLDIRECT" in the last 72 hours. Thyroid function studies No results for input(s): "TSH", "T4TOTAL", "T3FREE", "THYROIDAB" in the last 72 hours.  Invalid input(s): "FREET3" Anemia work up Recent Labs    09/15/22 0743  VITAMINB12 437  FOLATE 6.7  FERRITIN 986*  TIBC 144*  IRON 13*  RETICCTPCT 2.0   Urinalysis    Component Value Date/Time   COLORURINE YELLOW 08/01/2022 [place] 08/01/2022 1629   LABSPEC 1.011 08/01/2022 1629   PHURINE 5.0 08/01/2022 1629   GLUCOSEU NEGATIVE 08/01/2022 1629   HGBUR NEGATIVE 08/01/2022 1629   BILIRUBINUR NEGATIVE 08/01/2022 1629   KETONESUR NEGATIVE 08/01/2022 1629   PROTEINUR 30 (A) 08/01/2022 1629   UROBILINOGEN 1.0 03/02/2015 1100   NITRITE NEGATIVE 08/01/2022 1629   LEUKOCYTESUR NEGATIVE 08/01/2022 1629   Sepsis Labs Recent Labs  Lab 09/13/22 1029 09/14/22 0432 09/15/22 0403 09/16/22 0442  WBC 8.1 8.3 9.0 9.4   Microbiology Recent Results (from the past 240 hour(s))  Blood culture (routine x 2)     Status: Abnormal   Collection Time: 09/13/22 11:33 AM   Specimen: BLOOD  Result Value Ref Range Status   Specimen Description   Final    BLOOD RIGHT HAND Performed at [facility], [address], [place], [place] [address]    Special Requests   Final    BOTTLES DRAWN AEROBIC AND ANAEROBIC Blood Culture results may not be optimal due to an excessive volume of blood received in culture bottles Performed at [facility], [address], [place], [place] [address]    Culture  Setup Time   Final    GRAM POSITIVE COCCI AEROBIC BOTTLE ONLY Gram Stain Report Called to,Read  Back By and Verified With: HYLTON L @ 1660 ON [id number] BY HENDERSON L Performed at [facility], [address] [place], [place] [address]    Culture (A)  Final    STAPHYLOCOCCUS EPIDERMIDIS THE SIGNIFICANCE  OF ISOLATING THIS ORGANISM FROM A SINGLE SET OF BLOOD CULTURES WHEN MULTIPLE SETS ARE DRAWN IS UNCERTAIN. PLEASE NOTIFY THE MICROBIOLOGY DEPARTMENT WITHIN ONE WEEK IF SPECIATION AND SENSITIVITIES ARE REQUIRED. Performed at [facility], [place] [address], [place], [place] [address]    Report Status 09/16/2022 FINAL  Final  Blood culture (routine x 2)     Status: None (Preliminary result)   Collection Time: 09/13/22 11:45 AM   Specimen: BLOOD  Result Value Ref Range Status   Specimen Description BLOOD LEFT HAND  Final   Special Requests   Final    BOTTLES DRAWN AEROBIC ONLY Blood Culture results may not be optimal due to an excessive volume of blood received in culture bottles   Culture   Final    NO GROWTH 2 DAYS Performed at [facility], [address] [place], [place] [address]    Report Status PENDING  Incomplete  MRSA Next Gen by PCR, Nasal     Status: Abnormal   Collection Time: 09/13/22  8:03 PM   Specimen: Nasal Mucosa; Nasal Swab  Result Value Ref Range Status   MRSA by PCR Next Gen DETECTED (A) NOT DETECTED Final    Comment: RESULT CALLED TO, READ BACK BY AND VERIFIED WITH: KINDLEY,J ON 09/14/2022 @ 0233 BY ACOSTA,A Performed at [facility], [address], [place], [place] [address]   Culture, blood (Routine X 2) w Reflex to ID Panel     Status: None (Preliminary result)   Collection Time: 09/15/22  7:35 AM   Specimen: BLOOD LEFT FOREARM  Result Value Ref Range Status   Specimen Description   Final    BLOOD LEFT FOREARM BOTTLES DRAWN AEROBIC AND ANAEROBIC   Special Requests   Final    Blood Culture results may not be optimal due to an excessive volume of blood received in culture bottles   Culture   Final    NO GROWTH < 24 HOURS Performed at [facility], [address], [place], [place] [address]    Report Status PENDING  Incomplete  Culture, blood (Routine X 2) w Reflex to ID Panel     Status: None (Preliminary result)   Collection Time: 09/15/22  7:43 AM    Specimen: BLOOD LEFT HAND  Result Value Ref Range Status   Specimen Description   Final    BLOOD LEFT HAND BOTTLES DRAWN AEROBIC AND ANAEROBIC   Special Requests   Final    Blood Culture results may not be optimal due to an excessive volume of blood received in culture bottles   Culture   Final    NO GROWTH < 24 HOURS Performed at [facility], [address], [place], [place] [address]    Report Status PENDING  Incomplete     Time coordinating discharge: 35 minutes  SIGNED:   Rodena Goldmann, DO [facility] 09/16/2022, 1:42 PM  If 7PM-7AM, please contact night-coverage [email or web address]

## 2022-09-16 NOTE — Procedures (Signed)
   HEMODIALYSIS TREATMENT NOTE:   Uneventful 4 hour heparin-free treatment completed using right IJ tDC.  Goal met: 1 liter removed without interruption in UF.  One unit pRBCs transfused with HD.  All blood was returned.  No changes from pre-HD assessment.  Post HD 148/84, HR 87. Hand-off given to Jeanmarie Plant, RN.     [place], RN

## 2022-09-16 NOTE — TOC Initial Note (Signed)
Transition of Care [facility]) - Initial/Assessment Note    Patient Details  Name: [name] MRN: [id number] Date of Birth: 10/01/1946  Transition of Care [facility]) CM/SW Contact:    Salome Arnt, LCSW Phone Number: 09/16/2022, 12:33 PM  Clinical Narrative: Pt admitted with hypotension. Assessment completed due to high risk readmission score. Pt reports he d/c from SNF about a week ago. He was set up with [facility] HHPT/OT. Pt's niece lives with pt and provides transportation to appointments. He is on MWF dialysis schedule at [facility]. Pt indicates he is having a ramp installed at home today. No needs reported. Linda with [facility] notified of d/c and home health orders are in.                    Expected Discharge Plan: [place] Barriers to Discharge: Barriers Resolved   Patient Goals and CMS Choice Patient states their goals for this hospitalization and ongoing recovery are:: return home   Choice offered to / list presented to : Patient  Expected Discharge Plan and Services Expected Discharge Plan: [place] In-house Referral: Clinical Social Work   Post Acute Care Choice: Resumption of Svcs/PTA Provider Living arrangements for the past 2 months: [place] Expected Discharge Date: 09/16/22                         HH Arranged: OT, PT [place] Agency: [place] ([place]) Date [place]: 09/16/22 Time [place]: 1232 Representative spoke with at [place]: [place] Arrangements/Services Living arrangements for the past 2 months: [place] Lives with:: Relatives Patient language and need for interpreter reviewed:: Yes Do you feel safe going back to the place where you live?: Yes      Need for Family Participation in Patient Care: No (Comment) Care giver support system in place?: Yes (comment) Current home services: DME (wheelchair, walker) Criminal Activity/Legal  Involvement Pertinent to Current Situation/Hospitalization: No - Comment as needed  Activities of Daily Living Home Assistive Devices/Equipment: Dentures (specify type), Walker (specify type), Wheelchair ADL Screening (condition at time of admission) Patient's cognitive ability adequate to safely complete daily activities?: Yes Is the patient deaf or have difficulty hearing?: No Does the patient have difficulty seeing, even when wearing glasses/contacts?: No Does the patient have difficulty concentrating, remembering, or making decisions?: Yes Patient able to express need for assistance with ADLs?: Yes Does the patient have difficulty dressing or bathing?: Yes Independently performs ADLs?: No Communication: Independent Dressing (OT): Needs assistance Is this a change from baseline?: Pre-admission baseline Grooming: Needs assistance Is this a change from baseline?: Pre-admission baseline Feeding: Needs assistance Is this a change from baseline?: Pre-admission baseline Bathing: Needs assistance Is this a change from baseline?: Pre-admission baseline Toileting: Needs assistance Is this a change from baseline?: Pre-admission baseline In/Out Bed: Needs assistance Is this a change from baseline?: Pre-admission baseline Walks in Home: Needs assistance Is this a change from baseline?: Pre-admission baseline Does the patient have difficulty walking or climbing stairs?: Yes Weakness of Legs: Both Weakness of Arms/Hands: Both  Permission Sought/Granted                  Emotional Assessment     Affect (typically observed): Appropriate Orientation: : Oriented to Self, Oriented to Place, Oriented to  Time, Oriented to Situation Alcohol / Substance Use: Not Applicable Psych Involvement: No (comment)  Admission diagnosis:  Sepsis ([place]) [A41.9] Hypotension, unspecified hypotension type [I95.9] Patient Active Problem List   Diagnosis Date Noted   Hypotension    Sepsis ([place])  09/13/2022   Acute cholecystitis 08/19/2022   ESRD (end stage renal disease) ([place]) 08/07/2022   HFrEF (heart failure with reduced ejection fraction) (HCC)    (HFpEF) heart failure with preserved ejection fraction ([place]) 07/29/2022   Hypokalemia 07/29/2022   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   Acute respiratory failure with hypoxia ([place])    B12 deficiency 07/18/2022   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/2022   Diabetic neuropathy ([place]) 05/10/2022   Elevated troponin 05/10/2022   Type 2 diabetes mellitus with hyperglycemia ([place]) 05/10/2022   Other intervertebral disc degeneration, lumbar region 02/06/2022   Chronic gout, unspecified, 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ophi) 02/06/2022   Body mass index (BMI) 35.0-35.9, adult 02/06/2022   Obesity 02/06/2022   Primary generalized (osteo)arthritis 02/06/2022   Chest pain 02/06/2022   Lumbago with sciatica, right side 08/28/2021   Weakness of both lower extremities 08/28/2021   Body mass index (BMI) 33.0-33.9, adult 08/28/2021   Constipation 02/15/2021   Abdominal pain 01/02/2021   Diabetes mellitus ([place]) 11/09/2020   Hypertensive disorder 11/09/2020   Hypercholesterolemia 11/09/2020   H/O adenomatous polyp of colon 05/05/2020   Iron deficiency anemia 05/05/2020   Chronic kidney disease, stage IV (severe) ([place]) 12/14/2019   Acute gouty arthritis 10/25/2019   Leukocytosis 04/02/2019   Cellulitis of right hand 04/01/2019   Osteoarthritis of ankle and foot 02/08/2019   CHF (congestive heart failure) ([place]) 07/05/2015   Acute congestive heart failure ([place]) 07/04/2015   Acute on chronic diastolic congestive heart failure ([place]) 07/04/2015   Mixed hyperlipidemia 07/04/2015   Anxiety state 07/04/2015   GERD (gastroesophageal reflux disease) 07/04/2015   Heart block 07/04/2015   Gastroesophageal reflux disease 77/10/6578   Diastolic CHF, chronic (HCC) 03/11/2015   Chronic diastolic heart failure ([place]) 03/11/2015   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   Knee stiffness  01/13/2014   Gout 12/11/2013   Expected blood loss anemia 11/17/2013   S/P right TKA 11/15/2013   Anemia, normocytic normochromic 01/12/2013   Acute kidney injury superimposed on chronic kidney disease ([place]) 12/10/2012   Diabetes mellitus type 2 in obese ([place]) 12/10/2012   CAD S/P percutaneous coronary angioplasty 11/24/2012   Overweight(278.02) 11/17/2012   Chronic kidney disease, stage 3, cardiorenal syndrome 08/23/2011   Chronic kidney disease, stage 3 unspecified ([place]) 08/23/2011   Essential hypertension    PCP:  Redmond School, MD Pharmacy:   [facility], [place] [address] Phone: [phone] Fax: [phone]  [facility] [address] [place] [address] Phone: [phone] Fax: [phone]     Social Determinants of Health (SDOH) Interventions    Readmission Risk Interventions    09/16/2022   12:31 PM 08/02/2022    2:23 PM  Readmission Risk Prevention Plan  Transportation Screening Complete Complete  Medication Review (RN Care Manager) Complete Complete  HRI or Home Care Consult Complete Complete  SW Recovery Care/Counseling Consult Complete Complete  Palliative Care Screening Not Applicable Not Applicable  Skilled Nursing Facility Not Applicable Complete

## 2022-09-16 NOTE — Consult Note (Signed)
[facility] Renal Consultation Note    Indication for Consultation:  Management of ESRD/hemodialysis; anemia, hypertension/volume and secondary hyperparathyroidism  HPI: [name] is a 76 y.o. male with a PMH significant for CAD, h/o STEMI, DM type 2, HTN, HLD, GERD, vertigo, ESRD on HD MWF at [facility], and recent cholecystitis s/p cholecystostomy tube placement and antibiotics (poor surgical candidate) who presented to [facility] ED on 09/13/22 with c/o dizziness.  In the ED, he was noted to be hypotensive with SBP in the 70's/80's.  He was given IVF and admitted for further evaluation.  We were consulted to provide HD during his hospitalization.  He missed HD on 09/13/22 and had HD 09/14/22.    He is feeling better today without new complaints.   Past Medical History:  Diagnosis Date   Anginal pain ([place])    Anxiety    Benign prostatic hypertrophy    Nocturia   CAD (coronary artery disease)    a.  NSTEMI 10/2012 s/p DES to LAD & DES to 1st diagonal with residual diffuse nonobstructive dz in LCx/RCA    Chronic kidney disease, stage 3, mod decreased GFR (HCC)    Creatinine of 1.5 in 03/2009, STAGE 4 now   Degenerative joint disease    Depression    hx of   Diabetes mellitus, type II ([place]) 86/76/7209   Diastolic heart failure    Pedal edema, "a long time ago"   Erectile dysfunction    Flu 2013   hx of   GERD (gastroesophageal reflux disease)    Gout    Hemorrhoids    High cholesterol    Hypertension    Exercise induced   Myocardial infarction ([place])    Neuropathy    bilateral legs   NSVT (nonsustained ventricular tachycardia) ([place])    Exercise induced   Obesity    Tobacco abuse, 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, quit 1994   Tubular adenoma 2014   Vertigo    everyday   Past Surgical History:  Procedure Laterality Date   BACK SURGERY  2007   x2   BIOPSY  02/05/2021   Procedure: BIOPSY;  Surgeon: Daneil Dolin, MD;  Location: [facility];  Service: Endoscopy;;    CARDIAC CATHETERIZATION     with stent placement   COLONOSCOPY WITH ESOPHAGOGASTRODUODENOSCOPY (EGD) N/A 11/23/2013   Dr. Oneida Alar- TCS= left sided colitis and mild proctitis likely due to ischemic colitis, moderate internal hemorrhoids, small nodule in the rectum, bx=tubular adenoma, EGD=-mild non-erosive gastritis   COLONOSCOPY WITH PROPOFOL N/A 07/24/2020   hemorrhoids,  rectosigmoid lipoma, small adenoma removed.   CORONARY ANGIOPLASTY     ESOPHAGOGASTRODUODENOSCOPY (EGD) WITH PROPOFOL N/A 02/05/2021   Normal esophagus, erythematous mucosa in entire stomach, no obvious ulcer, s/p biopsy. Normal duodenum. Negative H.pylori.    HERNIA REPAIR     INSERTION OF DIALYSIS CATHETER N/A 08/06/2022   Procedure: INSERTION OF DIALYSIS CATHETER;  Surgeon: Rosetta Posner, MD;  Location: [facility] ORS;  Service: Vascular;  Laterality: N/A;   IR PERC CHOLECYSTOSTOMY  08/20/2022   JOINT REPLACEMENT     LEFT HEART CATHETERIZATION WITH CORONARY ANGIOGRAM N/A 11/16/2012   Procedure: LEFT HEART CATHETERIZATION WITH CORONARY ANGIOGRAM;  Surgeon: Sherren Mocha, MD;  Location: [facility] CATH LAB;  Service: Cardiovascular;  Laterality: N/A;   PERCUTANEOUS CORONARY STENT INTERVENTION (PCI-S)  11/16/2012   Procedure: PERCUTANEOUS CORONARY STENT INTERVENTION (PCI-S);  Surgeon: Sherren Mocha, MD;  Location: [facility] CATH LAB;  Service: Cardiovascular;;   POLYPECTOMY  07/24/2020   Procedure: POLYPECTOMY;  Surgeon: Daneil Dolin, MD;  Location: [facility];  Service: Endoscopy;;   SPINAL CORD STIMULATOR INSERTION N/A 06/10/2022   Procedure: Spinal cord stimulator placement;  Surgeon: Newman Pies, MD;  Location: [place];  Service: Neurosurgery;  Laterality: N/A;  RM 19/ to follow   THUMB AMPUTATION Left    50 years ago   TOTAL KNEE ARTHROPLASTY Right 11/15/2013   Procedure: RIGHT TOTAL KNEE ARTHROPLASTY;  Surgeon: Mauri Pole, MD;  Location: [facility] ORS;  Service: Orthopedics;  Laterality: Right;   TOTAL KNEE ARTHROPLASTY Left  03/07/2015   Procedure: LEFT TOTAL KNEE ARTHROPLASTY;  Surgeon: Paralee Cancel, MD;  Location: [facility] ORS;  Service: Orthopedics;  Laterality: Left;   Family History:   Family History  Problem Relation Age of Onset   Hypertension Other    Cancer Other    Heart disease Other    Diabetes Other    Hypertension Mother    Cancer Father        brain   CAD Brother    Colon cancer Neg Hx    Social History:  reports that he quit smoking about 29 years ago. His smoking use included cigarettes. He started smoking about 57 years ago. He has a 30.00 pack-year smoking history. He has never used smokeless tobacco. He reports that he does not drink alcohol and does not use drugs. Allergies  Allergen Reactions   Metformin Nausea And Vomiting   Niacin    Niacin And Related Other (See Comments)    Reaction: flushing and burning side effects even with extended release   Prior to Admission medications   Medication Sig Start Date End Date Taking? Authorizing Provider  acarbose (PRECOSE) 50 MG tablet Take 50 mg by mouth in the morning, at noon, and at bedtime.  10/20/17  Yes [provider]  acetaminophen (TYLENOL) 500 MG tablet Take 2 tablets (1,000 mg total) by mouth every 6 (six) hours. 08/10/22  Yes Shahmehdi, Seyed A, MD  allopurinol (ZYLOPRIM) 100 MG tablet Take 100 mg by mouth daily. 08/17/20  Yes [provider]  amLODipine (NORVASC) 10 MG tablet Take 10 mg by mouth every evening.    Yes [provider]  ascorbic acid (VITAMIN C) 500 MG tablet Take 500 mg by mouth daily.   Yes [provider]  aspirin EC 81 MG tablet Take 1 tablet (81 mg total) by mouth daily. 09/14/19  Yes Herminio Commons, MD  B Complex-C-Zn-Folic Acid (DIALYVITE 615-PPHK 15) 0.8 MG TABS Take 1 tablet by mouth daily. 09/04/22  Yes [provider]  carboxymethylcellulose (REFRESH PLUS) 0.5 % SOLN Place 1 drop into both eyes daily as needed (dry eyes).   Yes [provider]   carvedilol (COREG) 6.25 MG tablet Take 6.25 mg by mouth 2 (two) times daily with a meal. 04/18/22 04/18/23 Yes [provider]  hydrALAZINE (APRESOLINE) 100 MG tablet Take 1 tablet (100 mg total) by mouth 3 (three) times daily. 08/23/22  Yes Little Ishikawa, MD  insulin NPH Human (NOVOLIN N) 100 UNIT/ML injection Inject 0.18 mLs (18 Units total) into the skin every morning. Ands syringes 1/day Patient taking differently: Inject 18 Units into the skin daily. 06/27/21  Yes Renato Shin, MD  isosorbide mononitrate (IMDUR) 60 MG 24 hr tablet Take 1 tablet (60 mg total) by mouth 2 (two) times daily. 06/26/22 06/21/23 Yes BranchAlphonse Guild, MD  meclizine (ANTIVERT) 12.5 MG tablet Take 12.5 mg by mouth 3 (three)  times daily as needed for dizziness.   Yes [provider]  melatonin 5 MG TABS Take 5 mg by mouth at bedtime.   Yes [provider]  nitroGLYCERIN (NITROSTAT) 0.4 MG SL tablet Place 1 tablet (0.4 mg total) under the tongue every 5 (five) minutes as needed for chest pain. PLACE ONE TABLET UNDER THE TONGUE EVERY 5 MINUTES FOR 3 DOSES AS NEEDED Strength: 0.4 mg 02/07/22  Yes Branch, Alphonse Guild, MD  polyethylene glycol (MIRALAX / GLYCOLAX) 17 g packet Take 17 g by mouth daily. 08/11/22  Yes Shahmehdi, Seyed A, MD  pravastatin (PRAVACHOL) 40 MG tablet Take 1 tablet (40 mg total) by mouth every evening. 07/31/21 02/09/28 Yes Imogene Burn, PA-C  promethazine (PHENERGAN) 25 MG suppository Place 25 mg rectally every 12 (twelve) hours as needed for nausea or vomiting.   Yes [provider]  RESTASIS 0.05 % ophthalmic emulsion Place 1 drop into both eyes 2 (two) times daily. 06/19/22  Yes [provider]  senna (SENOKOT) 8.6 MG TABS tablet Take 2 tablets (17.2 mg total) by mouth 2 (two) times daily. 08/10/22  Yes Shahmehdi, Valeria Batman, MD  simethicone (MYLICON) 80 MG chewable tablet Chew 80 mg by mouth every 6 (six) hours as needed for flatulence.   Yes [provider]  sitaGLIPtin (JANUVIA) 25 MG tablet Take 25 mg by mouth daily.   Yes [provider]  traMADol (ULTRAM) 50 MG tablet Take 50 mg by mouth 4 (four) times daily as needed for pain. 08/14/22  Yes [provider]  Zinc 50 MG TABS Take 1 tablet by mouth daily.   Yes [provider]  amoxicillin-clavulanate (AUGMENTIN) 500-125 MG tablet Take 1 tablet (500 mg total) by mouth 2 (two) times daily. Patient not taking: Reported on 09/13/2022 08/23/22   Little Ishikawa, MD  calcitRIOL (ROCALTROL) 0.25 MCG capsule Take 0.25 mcg by mouth every Sunday.  12/13/16   [provider]  cloNIDine (CATAPRES) 0.1 MG tablet Take 0.1 mg by mouth 3 (three) times daily. 02/15/20   [provider]  epoetin alfa (EPOGEN) 3000 UNIT/ML injection Inject into the skin. 07/03/22   [provider]  TRUE METRIX BLOOD GLUCOSE TEST test strip USE TO TEST BLOOD SUGAR UP TO TWICE DAILY OR AS DIRECTED 08/31/21   Renato Shin, MD   Current Facility-Administered Medications  Medication Dose Route Frequency Provider Last Rate Last Admin   0.9 %  sodium chloride infusion (Manually program via Guardrails IV Fluids)   Intravenous Once Manuella Ghazi, Pratik D, DO       allopurinol (ZYLOPRIM) tablet 100 mg  100 mg Oral Daily Adefeso, Oladapo, DO   100 mg at 09/16/22 0835   alteplase (CATHFLO ACTIVASE) injection 2 mg  2 mg Intracatheter Once PRN Corliss Parish, MD       anticoagulant sodium citrate solution 5 mL  5 mL Intracatheter PRN Corliss Parish, MD       Chlorhexidine Gluconate Cloth 2 % PADS 6 each  6 each Topical Q0600 Truett Mainland, DO   6 each at 09/16/22 0450   Chlorhexidine Gluconate Cloth 2 % PADS 6 each  6 each Topical Q0600 Corliss Parish, MD   6 each at 09/16/22 0450   Chlorhexidine Gluconate Cloth 2 % PADS 6 each  6 each Topical Q0600 Corliss Parish, MD   6 each at 09/16/22 0450   cycloSPORINE (RESTASIS) 0.05 % ophthalmic emulsion 1 drop  1 drop  Both Eyes BID Truett Mainland,  DO   1 drop at 09/16/22 0835   ferric gluconate (FERRLECIT) 250 mg in sodium chloride 0.9 % 250 mL IVPB  250 mg Intravenous Daily Manuella Ghazi, Pratik D, DO   Stopped at 09/15/22 1229   heparin injection 1,000 Units  1,000 Units Intracatheter PRN Corliss Parish, MD       HYDROmorphone (DILAUDID) injection 0.5 mg  0.5 mg Intravenous Q4H PRN Manuella Ghazi, Pratik D, DO   0.5 mg at 09/15/22 0028   insulin aspart (novoLOG) injection 0-15 Units  0-15 Units Subcutaneous TID WC Truett Mainland, DO   2 Units at 09/16/22 7494   insulin aspart (novoLOG) injection 0-5 Units  0-5 Units Subcutaneous QHS Stinson, Jacob J, DO       linagliptin (TRADJENTA) tablet 5 mg  5 mg Oral Daily Truett Mainland, DO   5 mg at 09/16/22 4967   mupirocin ointment (BACTROBAN) 2 % 1 Application  1 Application Nasal BID Truett Mainland, DO   1 Application at 59/16/38 0835   ondansetron (ZOFRAN) tablet 4 mg  4 mg Oral Q6H PRN Truett Mainland, DO       Or   ondansetron [facility]) injection 4 mg  4 mg Intravenous Q6H PRN Truett Mainland, DO   4 mg at 09/14/22 2026   polyethylene glycol (MIRALAX / GLYCOLAX) packet 17 g  17 g Oral Daily PRN Truett Mainland, DO       polyvinyl alcohol (LIQUIFILM TEARS) 1.4 % ophthalmic solution 1 drop  1 drop Both Eyes Daily PRN Truett Mainland, DO       pravastatin (PRAVACHOL) tablet 40 mg  40 mg Oral QPM Truett Mainland, DO   40 mg at 09/15/22 1718   traMADol (ULTRAM) tablet 50 mg  50 mg Oral Q6H PRN Truett Mainland, DO   50 mg at 09/16/22 0210   Labs: Basic Metabolic Panel: Recent Labs  Lab 09/13/22 1029 09/14/22 0432 09/15/22 0403 09/16/22 0442  NA 138 138 137 138  K 3.6 3.3* 3.3* 3.4*  CL 97* 102 99 101  CO2 28 23 28 28   GLUCOSE 176* 123* 89 115*  BUN 40* 47* 25* 35*  CREATININE 5.91* 6.16* 3.75* 4.61*  CALCIUM 9.1 8.8* 8.7* 8.9  PHOS 4.2  --   --   --    Liver Function Tests: Recent Labs  Lab 09/13/22 1029 09/14/22 0432  AST 22 18  ALT 13 11   ALKPHOS 114 103  BILITOT 0.9 0.9  PROT 7.5 6.7  ALBUMIN 2.7* 2.5*   No results for input(s): "LIPASE", "AMYLASE" in the last 168 hours. No results for input(s): "AMMONIA" in the last 168 hours. CBC: Recent Labs  Lab 09/13/22 1029 09/14/22 0432 09/15/22 0403 09/15/22 0729 09/16/22 0442  WBC 8.1 8.3 9.0  --  9.4  NEUTROABS 5.5  --   --   --   --   HGB 7.2* 7.5* 6.5* 7.3* 7.0*  HCT 23.0* 23.8* 20.4* 23.0* 22.2*  MCV 100.4* 99.6 100.0  --  100.9*  PLT 234 250 237  --  235   Cardiac Enzymes: No results for input(s): "CKTOTAL", "CKMB", "CKMBINDEX", "TROPONINI" in the last 168 hours. CBG: Recent Labs  Lab 09/15/22 1604 09/15/22 1633 09/15/22 1805 09/15/22 2117 09/16/22 0718  GLUCAP 60* 50* 142* 109* 122*   Iron Studies:  Recent Labs    09/15/22 0743  IRON 13*  TIBC 144*  FERRITIN 986*   Studies/Results: No results found.  ROS: Pertinent items  are noted in HPI. Physical Exam: Vitals:   09/15/22 2000 09/16/22 0720 09/16/22 0757 09/16/22 0800  BP:    (!) 120/49  Pulse: 76   86  Resp: 17   16  Temp: 99.8 F (37.7 C) 99.1 F (37.3 C) 98.2 F (36.8 C)   TempSrc: Oral Oral    SpO2: 96%   98%  Weight:      Height:          Weight change:   Intake/Output Summary (Last 24 hours) at 09/16/2022 0908 Last data filed at 09/16/2022 0400 Gross per 24 hour  Intake 278.71 ml  Output 350 ml  Net -71.29 ml   BP (!) 120/49   Pulse 86   Temp 98.2 F (36.8 C)   Resp 16   Ht 6' 2"  (1.88 m)   Wt 99.8 kg   SpO2 98%   BMI 28.25 kg/m  General appearance: alert, cooperative, and no distress Head: Normocephalic, without obvious abnormality, atraumatic Resp: clear to auscultation bilaterally Cardio: regular rate and rhythm, S1, S2 normal, no murmur, click, rub or gallop GI: soft, non-tender; bowel sounds normal; no masses,  no organomegaly and cholecystostomy tube in place Extremities: extremities normal, atraumatic, no cyanosis or edema Dialysis Access:  Dialysis  Orders( as of last month) : Center: [facility]  on MWF . EDW 92 kg HD Bath 2K/2.5Ca  Time 4 hours Heparin none. Access RIJ TDC BFR 400 DFR 800     Assessment/Plan:  Hypotension - improved off of bp meds.    Acute cholecystitis - s/p cholecystostomy tube.  Plan to remove drain and perform cholecystectomy in 8 weeks  ESRD -  continue with HD on MWF schedule  Hypertension/volume  -  as above, improved.  Anemia  - Hgb dropped to 7.  Transfuse 2 units PRBC's with HD.  Metabolic bone disease -   continue with home meds  Nutrition -  renal diet, carb modified.  Disposition - hopeful discharge to SNF soon.   Donetta Potts, MD [place] 09/16/2022, 9:08 AM

## 2022-09-17 DIAGNOSIS — E782 Mixed hyperlipidemia: Secondary | ICD-10-CM | POA: Diagnosis not present

## 2022-09-17 DIAGNOSIS — Z7984 Long term (current) use of oral hypoglycemic drugs: Secondary | ICD-10-CM | POA: Diagnosis not present

## 2022-09-17 DIAGNOSIS — K219 Gastro-esophageal reflux disease without esophagitis: Secondary | ICD-10-CM | POA: Diagnosis not present

## 2022-09-17 DIAGNOSIS — I2510 Atherosclerotic heart disease of native coronary artery without angina pectoris: Secondary | ICD-10-CM | POA: Diagnosis not present

## 2022-09-17 DIAGNOSIS — M5136 Other intervertebral disc degeneration, lumbar region: Secondary | ICD-10-CM | POA: Diagnosis not present

## 2022-09-17 DIAGNOSIS — E1122 Type 2 diabetes mellitus with diabetic chronic kidney disease: Secondary | ICD-10-CM | POA: Diagnosis not present

## 2022-09-17 DIAGNOSIS — K810 Acute cholecystitis: Secondary | ICD-10-CM | POA: Diagnosis not present

## 2022-09-17 DIAGNOSIS — M1990 Unspecified osteoarthritis, unspecified site: Secondary | ICD-10-CM | POA: Diagnosis not present

## 2022-09-17 DIAGNOSIS — E1140 Type 2 diabetes mellitus with diabetic neuropathy, unspecified: Secondary | ICD-10-CM | POA: Diagnosis not present

## 2022-09-17 DIAGNOSIS — I5042 Chronic combined systolic (congestive) and diastolic (congestive) heart failure: Secondary | ICD-10-CM | POA: Diagnosis not present

## 2022-09-17 DIAGNOSIS — R42 Dizziness and giddiness: Secondary | ICD-10-CM | POA: Diagnosis not present

## 2022-09-17 DIAGNOSIS — L89152 Pressure ulcer of sacral region, stage 2: Secondary | ICD-10-CM | POA: Diagnosis not present

## 2022-09-17 DIAGNOSIS — Z794 Long term (current) use of insulin: Secondary | ICD-10-CM | POA: Diagnosis not present

## 2022-09-17 DIAGNOSIS — M1A30X0 Chronic gout due to renal impairment, unspecified site, without tophus (tophi): Secondary | ICD-10-CM | POA: Diagnosis not present

## 2022-09-17 DIAGNOSIS — D631 Anemia in chronic kidney disease: Secondary | ICD-10-CM | POA: Diagnosis not present

## 2022-09-17 DIAGNOSIS — Z434 Encounter for attention to other artificial openings of digestive tract: Secondary | ICD-10-CM | POA: Diagnosis not present

## 2022-09-17 DIAGNOSIS — I132 Hypertensive heart and chronic kidney disease with heart failure and with stage 5 chronic kidney disease, or end stage renal disease: Secondary | ICD-10-CM | POA: Diagnosis not present

## 2022-09-17 DIAGNOSIS — G8929 Other chronic pain: Secondary | ICD-10-CM | POA: Diagnosis not present

## 2022-09-17 DIAGNOSIS — N186 End stage renal disease: Secondary | ICD-10-CM | POA: Diagnosis not present

## 2022-09-17 DIAGNOSIS — I480 Paroxysmal atrial fibrillation: Secondary | ICD-10-CM | POA: Diagnosis not present

## 2022-09-17 DIAGNOSIS — F32A Depression, unspecified: Secondary | ICD-10-CM | POA: Diagnosis not present

## 2022-09-17 LAB — BPAM RBC
Blood Product Expiration Date: 202310122359
ISSUE DATE / TIME: 202309251009
Unit Type and Rh: 6200

## 2022-09-17 LAB — TYPE AND SCREEN
ABO/RH(D): A POS
Antibody Screen: NEGATIVE
Unit division: 0

## 2022-09-17 NOTE — Patient Outreach (Signed)
ALMUS WOODHAM Jan 22, 1946 [id number]  Referral:  [facility] Readmission Report request  *Remote review at patient has transitioned home. Patient has HD MWF at [facility] noted.  Primary Care Provider:  Redmond School, MD with [facility]   Chart was reviewed for referral request.  For questions,  Natividad Brood, RN BSN [place]  [phone] business mobile phone Toll free office [phone]  *[place]  [phone] Fax number: [phone] [email or web address]

## 2022-09-18 DIAGNOSIS — J90 Pleural effusion, not elsewhere classified: Secondary | ICD-10-CM | POA: Diagnosis not present

## 2022-09-18 DIAGNOSIS — D689 Coagulation defect, unspecified: Secondary | ICD-10-CM | POA: Diagnosis not present

## 2022-09-18 DIAGNOSIS — Z23 Encounter for immunization: Secondary | ICD-10-CM | POA: Diagnosis not present

## 2022-09-18 DIAGNOSIS — Z992 Dependence on renal dialysis: Secondary | ICD-10-CM | POA: Diagnosis not present

## 2022-09-18 DIAGNOSIS — N186 End stage renal disease: Secondary | ICD-10-CM | POA: Diagnosis not present

## 2022-09-18 DIAGNOSIS — R52 Pain, unspecified: Secondary | ICD-10-CM | POA: Diagnosis not present

## 2022-09-18 DIAGNOSIS — D631 Anemia in chronic kidney disease: Secondary | ICD-10-CM | POA: Diagnosis not present

## 2022-09-18 DIAGNOSIS — N2581 Secondary hyperparathyroidism of renal origin: Secondary | ICD-10-CM | POA: Diagnosis not present

## 2022-09-18 DIAGNOSIS — E1122 Type 2 diabetes mellitus with diabetic chronic kidney disease: Secondary | ICD-10-CM | POA: Diagnosis not present

## 2022-09-18 LAB — CULTURE, BLOOD (ROUTINE X 2): Culture: NO GROWTH

## 2022-09-19 ENCOUNTER — Ambulatory Visit: Payer: Medicare Other | Admitting: General Surgery

## 2022-09-19 ENCOUNTER — Telehealth (HOSPITAL_COMMUNITY): Payer: Self-pay

## 2022-09-19 DIAGNOSIS — E1140 Type 2 diabetes mellitus with diabetic neuropathy, unspecified: Secondary | ICD-10-CM | POA: Diagnosis not present

## 2022-09-19 DIAGNOSIS — L89159 Pressure ulcer of sacral region, unspecified stage: Secondary | ICD-10-CM | POA: Diagnosis not present

## 2022-09-19 DIAGNOSIS — I480 Paroxysmal atrial fibrillation: Secondary | ICD-10-CM | POA: Diagnosis not present

## 2022-09-19 DIAGNOSIS — E440 Moderate protein-calorie malnutrition: Secondary | ICD-10-CM | POA: Diagnosis not present

## 2022-09-19 DIAGNOSIS — D509 Iron deficiency anemia, unspecified: Secondary | ICD-10-CM | POA: Diagnosis not present

## 2022-09-19 DIAGNOSIS — I5042 Chronic combined systolic (congestive) and diastolic (congestive) heart failure: Secondary | ICD-10-CM | POA: Diagnosis not present

## 2022-09-19 DIAGNOSIS — E1151 Type 2 diabetes mellitus with diabetic peripheral angiopathy without gangrene: Secondary | ICD-10-CM | POA: Diagnosis not present

## 2022-09-19 DIAGNOSIS — E1121 Type 2 diabetes mellitus with diabetic nephropathy: Secondary | ICD-10-CM | POA: Diagnosis not present

## 2022-09-19 DIAGNOSIS — M5136 Other intervertebral disc degeneration, lumbar region: Secondary | ICD-10-CM | POA: Diagnosis not present

## 2022-09-19 DIAGNOSIS — D631 Anemia in chronic kidney disease: Secondary | ICD-10-CM | POA: Diagnosis not present

## 2022-09-19 DIAGNOSIS — R42 Dizziness and giddiness: Secondary | ICD-10-CM | POA: Diagnosis not present

## 2022-09-19 DIAGNOSIS — E782 Mixed hyperlipidemia: Secondary | ICD-10-CM | POA: Diagnosis not present

## 2022-09-19 DIAGNOSIS — G8929 Other chronic pain: Secondary | ICD-10-CM | POA: Diagnosis not present

## 2022-09-19 DIAGNOSIS — Z434 Encounter for attention to other artificial openings of digestive tract: Secondary | ICD-10-CM | POA: Diagnosis not present

## 2022-09-19 DIAGNOSIS — Z794 Long term (current) use of insulin: Secondary | ICD-10-CM | POA: Diagnosis not present

## 2022-09-19 DIAGNOSIS — I2510 Atherosclerotic heart disease of native coronary artery without angina pectoris: Secondary | ICD-10-CM | POA: Diagnosis not present

## 2022-09-19 DIAGNOSIS — K219 Gastro-esophageal reflux disease without esophagitis: Secondary | ICD-10-CM | POA: Diagnosis not present

## 2022-09-19 DIAGNOSIS — K810 Acute cholecystitis: Secondary | ICD-10-CM | POA: Diagnosis not present

## 2022-09-19 DIAGNOSIS — I132 Hypertensive heart and chronic kidney disease with heart failure and with stage 5 chronic kidney disease, or end stage renal disease: Secondary | ICD-10-CM | POA: Diagnosis not present

## 2022-09-19 DIAGNOSIS — N186 End stage renal disease: Secondary | ICD-10-CM | POA: Diagnosis not present

## 2022-09-19 DIAGNOSIS — M1A30X0 Chronic gout due to renal impairment, unspecified site, without tophus (tophi): Secondary | ICD-10-CM | POA: Diagnosis not present

## 2022-09-19 DIAGNOSIS — M1990 Unspecified osteoarthritis, unspecified site: Secondary | ICD-10-CM | POA: Diagnosis not present

## 2022-09-19 DIAGNOSIS — E08621 Diabetes mellitus due to underlying condition with foot ulcer: Secondary | ICD-10-CM | POA: Diagnosis not present

## 2022-09-19 DIAGNOSIS — E1122 Type 2 diabetes mellitus with diabetic chronic kidney disease: Secondary | ICD-10-CM | POA: Diagnosis not present

## 2022-09-19 DIAGNOSIS — Z7984 Long term (current) use of oral hypoglycemic drugs: Secondary | ICD-10-CM | POA: Diagnosis not present

## 2022-09-19 DIAGNOSIS — M48061 Spinal stenosis, lumbar region without neurogenic claudication: Secondary | ICD-10-CM | POA: Diagnosis not present

## 2022-09-19 DIAGNOSIS — F32A Depression, unspecified: Secondary | ICD-10-CM | POA: Diagnosis not present

## 2022-09-19 DIAGNOSIS — L89152 Pressure ulcer of sacral region, stage 2: Secondary | ICD-10-CM | POA: Diagnosis not present

## 2022-09-19 DIAGNOSIS — B353 Tinea pedis: Secondary | ICD-10-CM | POA: Diagnosis not present

## 2022-09-19 NOTE — Telephone Encounter (Signed)
Called to schedule drain injection, no answer, no vm. AW

## 2022-09-20 DIAGNOSIS — R52 Pain, unspecified: Secondary | ICD-10-CM | POA: Diagnosis not present

## 2022-09-20 DIAGNOSIS — D631 Anemia in chronic kidney disease: Secondary | ICD-10-CM | POA: Diagnosis not present

## 2022-09-20 DIAGNOSIS — D689 Coagulation defect, unspecified: Secondary | ICD-10-CM | POA: Diagnosis not present

## 2022-09-20 DIAGNOSIS — N2581 Secondary hyperparathyroidism of renal origin: Secondary | ICD-10-CM | POA: Diagnosis not present

## 2022-09-20 DIAGNOSIS — Z23 Encounter for immunization: Secondary | ICD-10-CM | POA: Diagnosis not present

## 2022-09-20 DIAGNOSIS — Z992 Dependence on renal dialysis: Secondary | ICD-10-CM | POA: Diagnosis not present

## 2022-09-20 DIAGNOSIS — N186 End stage renal disease: Secondary | ICD-10-CM | POA: Diagnosis not present

## 2022-09-20 DIAGNOSIS — E1122 Type 2 diabetes mellitus with diabetic chronic kidney disease: Secondary | ICD-10-CM | POA: Diagnosis not present

## 2022-09-20 LAB — CULTURE, BLOOD (ROUTINE X 2)
Culture: NO GROWTH
Culture: NO GROWTH

## 2022-09-21 DIAGNOSIS — Z992 Dependence on renal dialysis: Secondary | ICD-10-CM | POA: Diagnosis not present

## 2022-09-21 DIAGNOSIS — E1122 Type 2 diabetes mellitus with diabetic chronic kidney disease: Secondary | ICD-10-CM | POA: Diagnosis not present

## 2022-09-21 DIAGNOSIS — N186 End stage renal disease: Secondary | ICD-10-CM | POA: Diagnosis not present

## 2022-09-23 DIAGNOSIS — E039 Hypothyroidism, unspecified: Secondary | ICD-10-CM | POA: Diagnosis not present

## 2022-09-23 DIAGNOSIS — D631 Anemia in chronic kidney disease: Secondary | ICD-10-CM | POA: Diagnosis not present

## 2022-09-23 DIAGNOSIS — Z992 Dependence on renal dialysis: Secondary | ICD-10-CM | POA: Diagnosis not present

## 2022-09-23 DIAGNOSIS — Z23 Encounter for immunization: Secondary | ICD-10-CM | POA: Diagnosis not present

## 2022-09-23 DIAGNOSIS — R52 Pain, unspecified: Secondary | ICD-10-CM | POA: Diagnosis not present

## 2022-09-23 DIAGNOSIS — N2581 Secondary hyperparathyroidism of renal origin: Secondary | ICD-10-CM | POA: Diagnosis not present

## 2022-09-23 DIAGNOSIS — E1122 Type 2 diabetes mellitus with diabetic chronic kidney disease: Secondary | ICD-10-CM | POA: Diagnosis not present

## 2022-09-23 DIAGNOSIS — D689 Coagulation defect, unspecified: Secondary | ICD-10-CM | POA: Diagnosis not present

## 2022-09-23 DIAGNOSIS — N186 End stage renal disease: Secondary | ICD-10-CM | POA: Diagnosis not present

## 2022-09-23 DIAGNOSIS — R110 Nausea: Secondary | ICD-10-CM | POA: Diagnosis not present

## 2022-09-24 ENCOUNTER — Ambulatory Visit: Payer: Medicare Other | Admitting: General Surgery

## 2022-09-24 ENCOUNTER — Encounter: Payer: Self-pay | Admitting: General Surgery

## 2022-09-24 VITALS — BP 90/60 | HR 64 | Temp 98.3°F | Resp 12 | Ht 74.0 in | Wt 220.0 lb

## 2022-09-24 DIAGNOSIS — K810 Acute cholecystitis: Secondary | ICD-10-CM | POA: Diagnosis not present

## 2022-09-24 DIAGNOSIS — I5042 Chronic combined systolic (congestive) and diastolic (congestive) heart failure: Secondary | ICD-10-CM | POA: Diagnosis not present

## 2022-09-24 DIAGNOSIS — N186 End stage renal disease: Secondary | ICD-10-CM | POA: Diagnosis not present

## 2022-09-24 DIAGNOSIS — L89152 Pressure ulcer of sacral region, stage 2: Secondary | ICD-10-CM | POA: Diagnosis not present

## 2022-09-24 DIAGNOSIS — L89151 Pressure ulcer of sacral region, stage 1: Secondary | ICD-10-CM | POA: Diagnosis not present

## 2022-09-24 DIAGNOSIS — Z434 Encounter for attention to other artificial openings of digestive tract: Secondary | ICD-10-CM | POA: Diagnosis not present

## 2022-09-24 DIAGNOSIS — I132 Hypertensive heart and chronic kidney disease with heart failure and with stage 5 chronic kidney disease, or end stage renal disease: Secondary | ICD-10-CM | POA: Diagnosis not present

## 2022-09-24 DIAGNOSIS — E782 Mixed hyperlipidemia: Secondary | ICD-10-CM | POA: Diagnosis not present

## 2022-09-24 DIAGNOSIS — I2510 Atherosclerotic heart disease of native coronary artery without angina pectoris: Secondary | ICD-10-CM | POA: Diagnosis not present

## 2022-09-24 DIAGNOSIS — E1122 Type 2 diabetes mellitus with diabetic chronic kidney disease: Secondary | ICD-10-CM | POA: Diagnosis not present

## 2022-09-24 DIAGNOSIS — Z794 Long term (current) use of insulin: Secondary | ICD-10-CM | POA: Diagnosis not present

## 2022-09-24 DIAGNOSIS — G8929 Other chronic pain: Secondary | ICD-10-CM | POA: Diagnosis not present

## 2022-09-24 DIAGNOSIS — F32A Depression, unspecified: Secondary | ICD-10-CM | POA: Diagnosis not present

## 2022-09-24 DIAGNOSIS — K219 Gastro-esophageal reflux disease without esophagitis: Secondary | ICD-10-CM | POA: Diagnosis not present

## 2022-09-24 DIAGNOSIS — D631 Anemia in chronic kidney disease: Secondary | ICD-10-CM | POA: Diagnosis not present

## 2022-09-24 DIAGNOSIS — E1140 Type 2 diabetes mellitus with diabetic neuropathy, unspecified: Secondary | ICD-10-CM | POA: Diagnosis not present

## 2022-09-24 DIAGNOSIS — M5136 Other intervertebral disc degeneration, lumbar region: Secondary | ICD-10-CM | POA: Diagnosis not present

## 2022-09-24 DIAGNOSIS — M1A30X0 Chronic gout due to renal impairment, unspecified site, without tophus (tophi): Secondary | ICD-10-CM | POA: Diagnosis not present

## 2022-09-24 DIAGNOSIS — M1990 Unspecified osteoarthritis, unspecified site: Secondary | ICD-10-CM | POA: Diagnosis not present

## 2022-09-24 DIAGNOSIS — R42 Dizziness and giddiness: Secondary | ICD-10-CM | POA: Diagnosis not present

## 2022-09-24 DIAGNOSIS — I480 Paroxysmal atrial fibrillation: Secondary | ICD-10-CM | POA: Diagnosis not present

## 2022-09-24 DIAGNOSIS — Z7984 Long term (current) use of oral hypoglycemic drugs: Secondary | ICD-10-CM | POA: Diagnosis not present

## 2022-09-24 NOTE — Progress Notes (Signed)
[facility] Note   HPI:  76 y.o. Male presents to clinic for cholecystostomy tube care. He is very weak and is getting PT and OT at home. He is having no pain and the drain is doing its job. They are emptying it daily.  He is eating and having his dialysis. He is not able to walk and he says he wants to go back to a rehab to get stronger. He has a small bedsore now too they report.   Review of Systems:  Bedsore Cholecystostomy tube in place  All other review of systems: otherwise negative   Vital Signs:  BP 90/60   Pulse 64   Temp 98.3 F (36.8 C) (Oral)   Resp 12   Ht 6' 2"  (1.88 m)   Wt 220 lb (99.8 kg)   SpO2 97%   BMI 28.25 kg/m    Physical Exam:  Physical Exam Vitals reviewed.  Cardiovascular:     Rate and Rhythm: Normal rate.  Pulmonary:     Effort: Pulmonary effort is normal.  Abdominal:     General: There is no distension.     Palpations: Abdomen is soft.     Tenderness: There is no abdominal tenderness.     Comments: Cholecystostomy tube with bile in the bag  Genitourinary:    Comments: Upper sacral region with stage I decubitus  Skin:    General: Skin is warm.  Neurological:     General: No focal deficit present.     Mental Status: He is alert.     Assessment:  76 y.o. yo Male with a cholecystostomy tube in place but continues to be deconditioned and weak with the infection, recent hospitalization and starting dialysis. No with a early sacral decubitus ulcer too.    Plan:  Call Dr. Nolon Rod office for a after hospital visit and see him to discuss the air mattress and possible Home Health Nurse.  You might just need to call the [facility] if you feel like you need more therapy than you can get at home.  Keep off loading and covering the pressure sore.   Continue to drain the Cholecystostomy tube. If this stops draining you need to let someone know immediately, because it will need to be replaced.   You will follow up with Radiology  for them to check the drain You will follow up with Dr. Harl Bowie to ensure your heart is back to his baseline and if you are safe for surgery.   I will see you back in 6 weeks to see if you have gotten stronger and see if you are safer for surgery.   I reached out to social work that saw him last to see if they had any other thoughts for getting him more help at home or into rehab, but I have not heard back from them yet.    Future Appointments  Date Time Provider [place]  11/05/2022  9:45 AM Virl Cagey, MD [facility] None   Patient and family in agreement.  Greater than 77% of the 45 minute visit was spent in counseling/ coordination of care regarding the patient's needs, his new decubitus ulcer and planning for the next steps.    [name], MD [facility] [address] [place], [place] [address] [phone] (office)

## 2022-09-24 NOTE — Patient Instructions (Addendum)
Call Dr. Nolon Rod office for a after hospital visit and see him to discuss the air mattress and possible Home Health Nurse.  You might just need to call the [facility] if you feel like you need more therapy than you can get at home.  Keep off loading and covering the pressure sore.   Continue to drain the Cholecystostomy tube. If this stops draining you need to let someone know immediately, because it will need to be replaced.   You will follow up with Radiology for them to check the drain You will follow up with Dr. Harl Bowie to ensure your heart is back to his baseline and if you are safe for surgery.    I will see you back in 6 weeks to see if you have gotten stronger and see if you are safer for surgery.

## 2022-09-25 DIAGNOSIS — K810 Acute cholecystitis: Secondary | ICD-10-CM | POA: Diagnosis not present

## 2022-09-25 DIAGNOSIS — R42 Dizziness and giddiness: Secondary | ICD-10-CM | POA: Diagnosis not present

## 2022-09-25 DIAGNOSIS — I480 Paroxysmal atrial fibrillation: Secondary | ICD-10-CM | POA: Diagnosis not present

## 2022-09-25 DIAGNOSIS — Z794 Long term (current) use of insulin: Secondary | ICD-10-CM | POA: Diagnosis not present

## 2022-09-25 DIAGNOSIS — D631 Anemia in chronic kidney disease: Secondary | ICD-10-CM | POA: Diagnosis not present

## 2022-09-25 DIAGNOSIS — N2581 Secondary hyperparathyroidism of renal origin: Secondary | ICD-10-CM | POA: Diagnosis not present

## 2022-09-25 DIAGNOSIS — R52 Pain, unspecified: Secondary | ICD-10-CM | POA: Diagnosis not present

## 2022-09-25 DIAGNOSIS — I2510 Atherosclerotic heart disease of native coronary artery without angina pectoris: Secondary | ICD-10-CM | POA: Diagnosis not present

## 2022-09-25 DIAGNOSIS — E039 Hypothyroidism, unspecified: Secondary | ICD-10-CM | POA: Diagnosis not present

## 2022-09-25 DIAGNOSIS — E1122 Type 2 diabetes mellitus with diabetic chronic kidney disease: Secondary | ICD-10-CM | POA: Diagnosis not present

## 2022-09-25 DIAGNOSIS — D689 Coagulation defect, unspecified: Secondary | ICD-10-CM | POA: Diagnosis not present

## 2022-09-25 DIAGNOSIS — L89152 Pressure ulcer of sacral region, stage 2: Secondary | ICD-10-CM | POA: Diagnosis not present

## 2022-09-25 DIAGNOSIS — E1140 Type 2 diabetes mellitus with diabetic neuropathy, unspecified: Secondary | ICD-10-CM | POA: Diagnosis not present

## 2022-09-25 DIAGNOSIS — Z23 Encounter for immunization: Secondary | ICD-10-CM | POA: Diagnosis not present

## 2022-09-25 DIAGNOSIS — F32A Depression, unspecified: Secondary | ICD-10-CM | POA: Diagnosis not present

## 2022-09-25 DIAGNOSIS — K219 Gastro-esophageal reflux disease without esophagitis: Secondary | ICD-10-CM | POA: Diagnosis not present

## 2022-09-25 DIAGNOSIS — M5136 Other intervertebral disc degeneration, lumbar region: Secondary | ICD-10-CM | POA: Diagnosis not present

## 2022-09-25 DIAGNOSIS — I132 Hypertensive heart and chronic kidney disease with heart failure and with stage 5 chronic kidney disease, or end stage renal disease: Secondary | ICD-10-CM | POA: Diagnosis not present

## 2022-09-25 DIAGNOSIS — N186 End stage renal disease: Secondary | ICD-10-CM | POA: Diagnosis not present

## 2022-09-25 DIAGNOSIS — G8929 Other chronic pain: Secondary | ICD-10-CM | POA: Diagnosis not present

## 2022-09-25 DIAGNOSIS — I5042 Chronic combined systolic (congestive) and diastolic (congestive) heart failure: Secondary | ICD-10-CM | POA: Diagnosis not present

## 2022-09-25 DIAGNOSIS — M1990 Unspecified osteoarthritis, unspecified site: Secondary | ICD-10-CM | POA: Diagnosis not present

## 2022-09-25 DIAGNOSIS — E782 Mixed hyperlipidemia: Secondary | ICD-10-CM | POA: Diagnosis not present

## 2022-09-25 DIAGNOSIS — R110 Nausea: Secondary | ICD-10-CM | POA: Diagnosis not present

## 2022-09-25 DIAGNOSIS — Z434 Encounter for attention to other artificial openings of digestive tract: Secondary | ICD-10-CM | POA: Diagnosis not present

## 2022-09-25 DIAGNOSIS — M1A30X0 Chronic gout due to renal impairment, unspecified site, without tophus (tophi): Secondary | ICD-10-CM | POA: Diagnosis not present

## 2022-09-25 DIAGNOSIS — Z7984 Long term (current) use of oral hypoglycemic drugs: Secondary | ICD-10-CM | POA: Diagnosis not present

## 2022-09-25 DIAGNOSIS — Z992 Dependence on renal dialysis: Secondary | ICD-10-CM | POA: Diagnosis not present

## 2022-09-26 DIAGNOSIS — F32A Depression, unspecified: Secondary | ICD-10-CM | POA: Diagnosis not present

## 2022-09-26 DIAGNOSIS — N186 End stage renal disease: Secondary | ICD-10-CM | POA: Diagnosis not present

## 2022-09-26 DIAGNOSIS — R42 Dizziness and giddiness: Secondary | ICD-10-CM | POA: Diagnosis not present

## 2022-09-26 DIAGNOSIS — M5136 Other intervertebral disc degeneration, lumbar region: Secondary | ICD-10-CM | POA: Diagnosis not present

## 2022-09-26 DIAGNOSIS — G8929 Other chronic pain: Secondary | ICD-10-CM | POA: Diagnosis not present

## 2022-09-26 DIAGNOSIS — E1140 Type 2 diabetes mellitus with diabetic neuropathy, unspecified: Secondary | ICD-10-CM | POA: Diagnosis not present

## 2022-09-26 DIAGNOSIS — I2510 Atherosclerotic heart disease of native coronary artery without angina pectoris: Secondary | ICD-10-CM | POA: Diagnosis not present

## 2022-09-26 DIAGNOSIS — I132 Hypertensive heart and chronic kidney disease with heart failure and with stage 5 chronic kidney disease, or end stage renal disease: Secondary | ICD-10-CM | POA: Diagnosis not present

## 2022-09-26 DIAGNOSIS — D631 Anemia in chronic kidney disease: Secondary | ICD-10-CM | POA: Diagnosis not present

## 2022-09-26 DIAGNOSIS — E1122 Type 2 diabetes mellitus with diabetic chronic kidney disease: Secondary | ICD-10-CM | POA: Diagnosis not present

## 2022-09-26 DIAGNOSIS — M1A30X0 Chronic gout due to renal impairment, unspecified site, without tophus (tophi): Secondary | ICD-10-CM | POA: Diagnosis not present

## 2022-09-26 DIAGNOSIS — M1990 Unspecified osteoarthritis, unspecified site: Secondary | ICD-10-CM | POA: Diagnosis not present

## 2022-09-26 DIAGNOSIS — E782 Mixed hyperlipidemia: Secondary | ICD-10-CM | POA: Diagnosis not present

## 2022-09-26 DIAGNOSIS — Z794 Long term (current) use of insulin: Secondary | ICD-10-CM | POA: Diagnosis not present

## 2022-09-26 DIAGNOSIS — Z434 Encounter for attention to other artificial openings of digestive tract: Secondary | ICD-10-CM | POA: Diagnosis not present

## 2022-09-26 DIAGNOSIS — K219 Gastro-esophageal reflux disease without esophagitis: Secondary | ICD-10-CM | POA: Diagnosis not present

## 2022-09-26 DIAGNOSIS — Z7984 Long term (current) use of oral hypoglycemic drugs: Secondary | ICD-10-CM | POA: Diagnosis not present

## 2022-09-26 DIAGNOSIS — I5042 Chronic combined systolic (congestive) and diastolic (congestive) heart failure: Secondary | ICD-10-CM | POA: Diagnosis not present

## 2022-09-26 DIAGNOSIS — L89152 Pressure ulcer of sacral region, stage 2: Secondary | ICD-10-CM | POA: Diagnosis not present

## 2022-09-26 DIAGNOSIS — I480 Paroxysmal atrial fibrillation: Secondary | ICD-10-CM | POA: Diagnosis not present

## 2022-09-26 DIAGNOSIS — K810 Acute cholecystitis: Secondary | ICD-10-CM | POA: Diagnosis not present

## 2022-09-27 ENCOUNTER — Encounter: Payer: Self-pay | Admitting: *Deleted

## 2022-09-27 ENCOUNTER — Telehealth: Payer: Self-pay

## 2022-09-27 DIAGNOSIS — E1122 Type 2 diabetes mellitus with diabetic chronic kidney disease: Secondary | ICD-10-CM | POA: Diagnosis not present

## 2022-09-27 DIAGNOSIS — N186 End stage renal disease: Secondary | ICD-10-CM | POA: Diagnosis not present

## 2022-09-27 DIAGNOSIS — R110 Nausea: Secondary | ICD-10-CM | POA: Diagnosis not present

## 2022-09-27 DIAGNOSIS — E039 Hypothyroidism, unspecified: Secondary | ICD-10-CM | POA: Diagnosis not present

## 2022-09-27 DIAGNOSIS — R52 Pain, unspecified: Secondary | ICD-10-CM | POA: Diagnosis not present

## 2022-09-27 DIAGNOSIS — D689 Coagulation defect, unspecified: Secondary | ICD-10-CM | POA: Diagnosis not present

## 2022-09-27 DIAGNOSIS — D631 Anemia in chronic kidney disease: Secondary | ICD-10-CM | POA: Diagnosis not present

## 2022-09-27 DIAGNOSIS — Z992 Dependence on renal dialysis: Secondary | ICD-10-CM | POA: Diagnosis not present

## 2022-09-27 DIAGNOSIS — N2581 Secondary hyperparathyroidism of renal origin: Secondary | ICD-10-CM | POA: Diagnosis not present

## 2022-09-27 DIAGNOSIS — Z23 Encounter for immunization: Secondary | ICD-10-CM | POA: Diagnosis not present

## 2022-09-27 NOTE — Telephone Encounter (Signed)
..     Pre-operative Risk Assessment    Patient Name: [name]  DOB: 31-Mar-1946 MRN: [id number]      Request for Surgical Clearance    Procedure:   cholecystectomy  Date of Surgery:  Clearance TBD                                 Surgeon:  [facility] Surgeon's Group or Practice Name:  [place] Phone number:  [phone] Fax number:  [phone]   Type of Clearance Requested:   - Medical  - Pharmacy:  Hold Aspirin     Type of Anesthesia:  Not Indicated   Additional requests/questions:    Gwenlyn Found   09/27/2022, 11:47 AM

## 2022-09-27 NOTE — Telephone Encounter (Signed)
Preop team, patient has an upcoming hospital follow-up appointment with cardiology on 10/01/2022.  Please add preoperative cardiac evaluation to appointment note.  I will defer preoperative cardiac evaluation to upcoming appointment.  The patient may hold her aspirin for 5-7 days prior to surgery.   Thank you for your help.  Jossie Ng. Augusta Mirkin NP-C     09/27/2022, 1:06 PM [place] [address] Office [phone] Fax [phone]

## 2022-09-27 NOTE — Telephone Encounter (Signed)
Pt has appt 10/01/22 with Nicholes Rough, PAC

## 2022-09-30 DIAGNOSIS — M545 Low back pain, unspecified: Secondary | ICD-10-CM | POA: Diagnosis not present

## 2022-09-30 DIAGNOSIS — Z01810 Encounter for preprocedural cardiovascular examination: Secondary | ICD-10-CM | POA: Diagnosis not present

## 2022-09-30 DIAGNOSIS — E785 Hyperlipidemia, unspecified: Secondary | ICD-10-CM | POA: Diagnosis not present

## 2022-09-30 DIAGNOSIS — R110 Nausea: Secondary | ICD-10-CM | POA: Diagnosis not present

## 2022-09-30 DIAGNOSIS — R52 Pain, unspecified: Secondary | ICD-10-CM | POA: Diagnosis not present

## 2022-09-30 DIAGNOSIS — Z992 Dependence on renal dialysis: Secondary | ICD-10-CM | POA: Diagnosis not present

## 2022-09-30 DIAGNOSIS — R2689 Other abnormalities of gait and mobility: Secondary | ICD-10-CM | POA: Diagnosis not present

## 2022-09-30 DIAGNOSIS — M6282 Rhabdomyolysis: Secondary | ICD-10-CM | POA: Diagnosis not present

## 2022-09-30 DIAGNOSIS — I5042 Chronic combined systolic (congestive) and diastolic (congestive) heart failure: Secondary | ICD-10-CM | POA: Diagnosis not present

## 2022-09-30 DIAGNOSIS — K810 Acute cholecystitis: Secondary | ICD-10-CM | POA: Diagnosis not present

## 2022-09-30 DIAGNOSIS — N2581 Secondary hyperparathyroidism of renal origin: Secondary | ICD-10-CM | POA: Diagnosis not present

## 2022-09-30 DIAGNOSIS — Z9861 Coronary angioplasty status: Secondary | ICD-10-CM | POA: Diagnosis not present

## 2022-09-30 DIAGNOSIS — Z4800 Encounter for change or removal of nonsurgical wound dressing: Secondary | ICD-10-CM | POA: Diagnosis not present

## 2022-09-30 DIAGNOSIS — M1A9XX1 Chronic gout, unspecified, 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ophi): Secondary | ICD-10-CM | POA: Diagnosis not present

## 2022-09-30 DIAGNOSIS — N186 End stage renal disease: Secondary | ICD-10-CM | POA: Diagnosis not present

## 2022-09-30 DIAGNOSIS — I2510 Atherosclerotic heart disease of native coronary artery without angina pectoris: Secondary | ICD-10-CM | POA: Diagnosis not present

## 2022-09-30 DIAGNOSIS — K819 Cholecystitis, unspecified: Secondary | ICD-10-CM | POA: Diagnosis not present

## 2022-09-30 DIAGNOSIS — D631 Anemia in chronic kidney disease: Secondary | ICD-10-CM | POA: Diagnosis not present

## 2022-09-30 DIAGNOSIS — Z934 Other artificial openings of gastrointestinal tract status: Secondary | ICD-10-CM | POA: Diagnosis not present

## 2022-09-30 DIAGNOSIS — I10 Essential (primary) hypertension: Secondary | ICD-10-CM | POA: Diagnosis not present

## 2022-09-30 DIAGNOSIS — Z434 Encounter for attention to other artificial openings of digestive tract: Secondary | ICD-10-CM | POA: Diagnosis not present

## 2022-09-30 DIAGNOSIS — E039 Hypothyroidism, unspecified: Secondary | ICD-10-CM | POA: Diagnosis not present

## 2022-09-30 DIAGNOSIS — R278 Other lack of coordination: Secondary | ICD-10-CM | POA: Diagnosis not present

## 2022-09-30 DIAGNOSIS — M6289 Other specified disorders of muscle: Secondary | ICD-10-CM | POA: Diagnosis not present

## 2022-09-30 DIAGNOSIS — E1122 Type 2 diabetes mellitus with diabetic chronic kidney disease: Secondary | ICD-10-CM | POA: Diagnosis not present

## 2022-09-30 DIAGNOSIS — D649 Anemia, unspecified: Secondary | ICD-10-CM | POA: Diagnosis not present

## 2022-09-30 DIAGNOSIS — E119 Type 2 diabetes mellitus without complications: Secondary | ICD-10-CM | POA: Diagnosis not present

## 2022-09-30 DIAGNOSIS — M6281 Muscle weakness (generalized): Secondary | ICD-10-CM | POA: Diagnosis not present

## 2022-09-30 DIAGNOSIS — Z23 Encounter for immunization: Secondary | ICD-10-CM | POA: Diagnosis not present

## 2022-09-30 DIAGNOSIS — R5381 Other malaise: Secondary | ICD-10-CM | POA: Diagnosis not present

## 2022-09-30 DIAGNOSIS — L89152 Pressure ulcer of sacral region, stage 2: Secondary | ICD-10-CM | POA: Diagnosis not present

## 2022-09-30 DIAGNOSIS — E1169 Type 2 diabetes mellitus with other specified complication: Secondary | ICD-10-CM | POA: Diagnosis not present

## 2022-09-30 DIAGNOSIS — I5032 Chronic diastolic (congestive) heart failure: Secondary | ICD-10-CM | POA: Diagnosis not present

## 2022-09-30 DIAGNOSIS — D689 Coagulation defect, unspecified: Secondary | ICD-10-CM | POA: Diagnosis not present

## 2022-09-30 DIAGNOSIS — M5136 Other intervertebral disc degeneration, lumbar region: Secondary | ICD-10-CM | POA: Diagnosis not present

## 2022-10-01 ENCOUNTER — Ambulatory Visit: Payer: Medicare Other | Admitting: Physician Assistant

## 2022-10-01 DIAGNOSIS — R5381 Other malaise: Secondary | ICD-10-CM | POA: Diagnosis not present

## 2022-10-01 DIAGNOSIS — K819 Cholecystitis, unspecified: Secondary | ICD-10-CM | POA: Diagnosis not present

## 2022-10-02 DIAGNOSIS — E039 Hypothyroidism, unspecified: Secondary | ICD-10-CM | POA: Diagnosis not present

## 2022-10-02 DIAGNOSIS — N186 End stage renal disease: Secondary | ICD-10-CM | POA: Diagnosis not present

## 2022-10-02 DIAGNOSIS — Z23 Encounter for immunization: Secondary | ICD-10-CM | POA: Diagnosis not present

## 2022-10-02 DIAGNOSIS — R52 Pain, unspecified: Secondary | ICD-10-CM | POA: Diagnosis not present

## 2022-10-02 DIAGNOSIS — N2581 Secondary hyperparathyroidism of renal origin: Secondary | ICD-10-CM | POA: Diagnosis not present

## 2022-10-02 DIAGNOSIS — L89152 Pressure ulcer of sacral region, stage 2: Secondary | ICD-10-CM | POA: Diagnosis not present

## 2022-10-02 DIAGNOSIS — Z992 Dependence on renal dialysis: Secondary | ICD-10-CM | POA: Diagnosis not present

## 2022-10-02 DIAGNOSIS — E1122 Type 2 diabetes mellitus with diabetic chronic kidney disease: Secondary | ICD-10-CM | POA: Diagnosis not present

## 2022-10-02 DIAGNOSIS — D631 Anemia in chronic kidney disease: Secondary | ICD-10-CM | POA: Diagnosis not present

## 2022-10-02 DIAGNOSIS — E119 Type 2 diabetes mellitus without complications: Secondary | ICD-10-CM | POA: Diagnosis not present

## 2022-10-02 DIAGNOSIS — D689 Coagulation defect, unspecified: Secondary | ICD-10-CM | POA: Diagnosis not present

## 2022-10-02 DIAGNOSIS — R110 Nausea: Secondary | ICD-10-CM | POA: Diagnosis not present

## 2022-10-03 DIAGNOSIS — E119 Type 2 diabetes mellitus without complications: Secondary | ICD-10-CM | POA: Diagnosis not present

## 2022-10-03 DIAGNOSIS — Z4800 Encounter for change or removal of nonsurgical wound dressing: Secondary | ICD-10-CM | POA: Diagnosis not present

## 2022-10-04 DIAGNOSIS — Z992 Dependence on renal dialysis: Secondary | ICD-10-CM | POA: Diagnosis not present

## 2022-10-04 DIAGNOSIS — N2581 Secondary hyperparathyroidism of renal origin: Secondary | ICD-10-CM | POA: Diagnosis not present

## 2022-10-04 DIAGNOSIS — E1122 Type 2 diabetes mellitus with diabetic chronic kidney disease: Secondary | ICD-10-CM | POA: Diagnosis not present

## 2022-10-04 DIAGNOSIS — Z434 Encounter for attention to other artificial openings of digestive tract: Secondary | ICD-10-CM | POA: Diagnosis not present

## 2022-10-04 DIAGNOSIS — D689 Coagulation defect, unspecified: Secondary | ICD-10-CM | POA: Diagnosis not present

## 2022-10-04 DIAGNOSIS — K819 Cholecystitis, unspecified: Secondary | ICD-10-CM | POA: Diagnosis not present

## 2022-10-04 DIAGNOSIS — Z23 Encounter for immunization: Secondary | ICD-10-CM | POA: Diagnosis not present

## 2022-10-04 DIAGNOSIS — N186 End stage renal disease: Secondary | ICD-10-CM | POA: Diagnosis not present

## 2022-10-04 DIAGNOSIS — D631 Anemia in chronic kidney disease: Secondary | ICD-10-CM | POA: Diagnosis not present

## 2022-10-04 DIAGNOSIS — R110 Nausea: Secondary | ICD-10-CM | POA: Diagnosis not present

## 2022-10-04 DIAGNOSIS — R52 Pain, unspecified: Secondary | ICD-10-CM | POA: Diagnosis not present

## 2022-10-04 DIAGNOSIS — E039 Hypothyroidism, unspecified: Secondary | ICD-10-CM | POA: Diagnosis not present

## 2022-10-08 ENCOUNTER — Ambulatory Visit: Payer: Medicare Other | Admitting: General Surgery

## 2022-10-08 DIAGNOSIS — Z992 Dependence on renal dialysis: Secondary | ICD-10-CM | POA: Diagnosis not present

## 2022-10-08 DIAGNOSIS — N2581 Secondary hyperparathyroidism of renal origin: Secondary | ICD-10-CM | POA: Diagnosis not present

## 2022-10-08 DIAGNOSIS — D689 Coagulation defect, unspecified: Secondary | ICD-10-CM | POA: Diagnosis not present

## 2022-10-08 DIAGNOSIS — E1122 Type 2 diabetes mellitus with diabetic chronic kidney disease: Secondary | ICD-10-CM | POA: Diagnosis not present

## 2022-10-08 DIAGNOSIS — D631 Anemia in chronic kidney disease: Secondary | ICD-10-CM | POA: Diagnosis not present

## 2022-10-08 DIAGNOSIS — N186 End stage renal disease: Secondary | ICD-10-CM | POA: Diagnosis not present

## 2022-10-08 DIAGNOSIS — R52 Pain, unspecified: Secondary | ICD-10-CM | POA: Diagnosis not present

## 2022-10-08 DIAGNOSIS — R110 Nausea: Secondary | ICD-10-CM | POA: Diagnosis not present

## 2022-10-08 DIAGNOSIS — E039 Hypothyroidism, unspecified: Secondary | ICD-10-CM | POA: Diagnosis not present

## 2022-10-08 DIAGNOSIS — Z23 Encounter for immunization: Secondary | ICD-10-CM | POA: Diagnosis not present

## 2022-10-09 DIAGNOSIS — R52 Pain, unspecified: Secondary | ICD-10-CM | POA: Diagnosis not present

## 2022-10-09 DIAGNOSIS — L89152 Pressure ulcer of sacral region, stage 2: Secondary | ICD-10-CM | POA: Diagnosis not present

## 2022-10-09 DIAGNOSIS — E039 Hypothyroidism, unspecified: Secondary | ICD-10-CM | POA: Diagnosis not present

## 2022-10-09 DIAGNOSIS — Z23 Encounter for immunization: Secondary | ICD-10-CM | POA: Diagnosis not present

## 2022-10-09 DIAGNOSIS — R110 Nausea: Secondary | ICD-10-CM | POA: Diagnosis not present

## 2022-10-09 DIAGNOSIS — N2581 Secondary hyperparathyroidism of renal origin: Secondary | ICD-10-CM | POA: Diagnosis not present

## 2022-10-09 DIAGNOSIS — D631 Anemia in chronic kidney disease: Secondary | ICD-10-CM | POA: Diagnosis not present

## 2022-10-09 DIAGNOSIS — E1122 Type 2 diabetes mellitus with diabetic chronic kidney disease: Secondary | ICD-10-CM | POA: Diagnosis not present

## 2022-10-09 DIAGNOSIS — N186 End stage renal disease: Secondary | ICD-10-CM | POA: Diagnosis not present

## 2022-10-09 DIAGNOSIS — E1169 Type 2 diabetes mellitus with other specified complication: Secondary | ICD-10-CM | POA: Diagnosis not present

## 2022-10-09 DIAGNOSIS — D649 Anemia, unspecified: Secondary | ICD-10-CM | POA: Diagnosis not present

## 2022-10-09 DIAGNOSIS — I5032 Chronic diastolic (congestive) heart failure: Secondary | ICD-10-CM | POA: Diagnosis not present

## 2022-10-09 DIAGNOSIS — Z992 Dependence on renal dialysis: Secondary | ICD-10-CM | POA: Diagnosis not present

## 2022-10-09 DIAGNOSIS — D689 Coagulation defect, unspecified: Secondary | ICD-10-CM | POA: Diagnosis not present

## 2022-10-10 ENCOUNTER — Ambulatory Visit: Payer: Medicare Other | Attending: Physician Assistant | Admitting: Physician Assistant

## 2022-10-10 ENCOUNTER — Encounter: Payer: Self-pay | Admitting: Physician Assistant

## 2022-10-10 VITALS — BP 160/78 | HR 93 | Ht 74.0 in | Wt 191.0 lb

## 2022-10-10 DIAGNOSIS — Z9861 Coronary angioplasty status: Secondary | ICD-10-CM | POA: Diagnosis not present

## 2022-10-10 DIAGNOSIS — I2510 Atherosclerotic heart disease of native coronary artery without angina pectoris: Secondary | ICD-10-CM | POA: Diagnosis not present

## 2022-10-10 DIAGNOSIS — Z01810 Encounter for preprocedural cardiovascular examination: Secondary | ICD-10-CM | POA: Diagnosis not present

## 2022-10-10 DIAGNOSIS — I5042 Chronic combined systolic (congestive) and diastolic (congestive) heart failure: Secondary | ICD-10-CM

## 2022-10-10 DIAGNOSIS — I10 Essential (primary) hypertension: Secondary | ICD-10-CM | POA: Diagnosis not present

## 2022-10-10 MED ORDER — HYDRALAZINE HCL 25 MG PO TABS: 25.0000 mg | ORAL_TABLET | Freq: Two times a day (BID) | ORAL | 2 refills | Status: DC

## 2022-10-10 MED ORDER — CARVEDILOL 3.125 MG PO TABS: 3.1250 mg | ORAL_TABLET | Freq: Two times a day (BID) | ORAL | 2 refills | Status: DC

## 2022-10-10 MED ORDER — ISOSORBIDE MONONITRATE ER 30 MG PO TB24: 15.0000 mg | ORAL_TABLET | Freq: Every day | ORAL | 2 refills | Status: DC

## 2022-10-10 NOTE — Patient Instructions (Addendum)
Medication Instructions:  START Coreg 3.153m take 1 tablet twice a day  START Hydralazine 231mTake 1 tablet twice a day START Imdur 1548make 1 tablet daily (tablet may only come in a 75m67m you will need to break it in half)   *If you need a refill on your cardiac medications before your next appointment, please call your pharmacy*   Lab Work: None ordered   Testing/Procedures: None ordered   Follow-Up: [facility] and your health needs are our priority.  As part of our continuing mission to provide you with exceptional heart care, we have created designated Provider Care Teams.  These Care Teams include your primary Cardiologist (physician) and Advanced Practice Providers (APPs -  Physician Assistants and Nurse Practitioners) who all work together to provide you with the care you need, when you need it.  We recommend signing up for the patient portal called "MyChart".  Sign up information is provided on this After Visit Summary.  MyChart is used to connect with patients for Virtual Visits (Telemedicine).  Patients are able to view lab/test results, encounter notes, upcoming appointments, etc.  Non-urgent messages can be sent to your provider as well.   To learn more about what you can do with MyChart, go to [email or web address] Your next appointment:   3 month(s)  The format for your next appointment:   In Person  Provider:   You may see BranCarlyle Dolly or one of the following Advanced Practice Providers on your designated Care Team:   BritBernerd Pho-C  MichErmalinda Barrios-C     Other Instructions   Important Information About Sugar

## 2022-10-10 NOTE — Progress Notes (Addendum)
Cardiology Office Note:    Date:  10/10/2022   ID:  [name], DOB June 11, 1946, MRN [id number]  PCP:  Redmond School, MD  [facility] Cardiologist:  Carlyle Dolly, MD  [facility] Electrophysiologist:  None   Chief Complaint: surgical clearance for cholecystectomy & hospital follow up  History of Present Illness:    LARWENCE TU is a 76 y.o. male with a hx of CAD (s/p DES to LAD and D1 in 10/2012 with nonobstructive disease along RCA and LCx), HFrEF, HTN, HLD, ESRD on HD, DM,  PAF (not on long term anticoagulation) 1st degree AV Block, episodes of Wenckebach, nocturnal pause seen for follow up.   He walked into the office 03/2022 reporting chest pain and refused to go to the ED for evaluation. Dr. Harl Bowie recommended a Leane Call for further evaluation and this was performed on 05/06/2022 and showed findings consistent with prior inferior infarct and a small apical infarct with mild peri-infarct ischemia but was overall felt to be a low risk study.  EF was preserved at 55%.   He presented to the ED on 05/09/2022 for evaluation of chest pain and dyspnea on exertion consistent with angina. Because of CKD and no intention of going on dialysis, LHC too high risk. Imdur increased to 60 mg daily. Medical management recommended.  Imdur increased 60 mg bid in July.  Admitted 07/2022 for CHF exacerbation in the setting of comorbid illness, no clear evidence of ACS by high-sensitivity troponin I levels (flat in the 40s).  LVEF approximately 35 to 40%.  Does have known CAD at baseline, LVEF was normal as of May at which point he had evidence of inferior wall scar with mild peri-infarct ischemia managed medically.  Current degree of renal disease affects optimal GDMT. Started on HD during admission. Has some bradycardia.   He was readmitted later for acute cholecystitis status post percutaneous cholecystostomy tube.   Another admission for hypotension, DC  antihypertensive (amlodipine  47m qd, Coreg 6.262mBID, clonidine 0.4m68mhydralazine 100m40mmdur 60mg74m . Given iron infusion and PRBCs for anemia.   [facility].  He does physical therapy 4 days/week without chest pain or shortness of breath.  At end of session, he gets tired and weak.  He denies orthopnea, PND, syncope, lower extremity edema or melena.  No documentation about his blood pressure during dialysis session.  Reports his gallbladder acting up intermittently and needs to take out.  Past Medical History:  Diagnosis Date   Anginal pain (HCC) [place]    Benign prostatic hypertrophy    Nocturia   CAD (coronary artery disease)    a.  NSTEMI 10/2012 s/p DES to LAD & DES to 1st diagonal with residual diffuse nonobstructive dz in LCx/RCA    Chronic kidney disease, stage 3, mod decreased GFR (HCC)    Creatinine of 1.5 in 03/2009, STAGE 4 now   Degenerative joint disease    Depression    hx of   Diabetes mellitus, type II (HCC) [place]1954/65/6812astolic heart failure    Pedal edema, "a long time ago"   Erectile dysfunction    Flu 2013   hx of   GERD (gastroesophageal reflux disease)    Gout    Hemorrhoids    High cholesterol    Hypertension    Exercise induced   Myocardial infarction (HCC) [place]    bilateral legs   NSVT (nonsustained ventricular  tachycardia) ([place])    Exercise induced   Obesity    Tobacco abuse, 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 Oneida Alar- TCS= left sided colitis and mild proctitis likely due to ischemic colitis, moderate internal hemorrhoids, small nodule  in the rectum, bx=tubular adenoma, EGD=-mild non-erosive gastritis   COLONOSCOPY WITH PROPOFOL N/A 07/24/2020   hemorrhoids,  rectosigmoid lipoma, small adenoma removed.   CORONARY ANGIOPLASTY     ESOPHAGOGASTRODUODENOSCOPY (EGD) WITH PROPOFOL N/A 02/05/2021   Normal esophagus, erythematous mucosa in entire stomach, no obvious ulcer, s/p biopsy. Normal duodenum. Negative H.pylori.    HERNIA REPAIR     INSERTION OF DIALYSIS CATHETER N/A 08/06/2022   Procedure: INSERTION OF DIALYSIS CATHETER;  Surgeon: Rosetta Posner, MD;  Location: [facility];  Service: Vascular;  Laterality: N/A;   IR PERC CHOLECYSTOSTOMY  08/20/2022   JOINT REPLACEMENT     LEFT HEART CATHETERIZATION WITH CORONARY ANGIOGRAM N/A 11/16/2012   Procedure: LEFT HEART CATHETERIZATION WITH CORONARY ANGIOGRAM;  Surgeon: Sherren Mocha, MD;  Location[facility] CATH LAB;  Service: Cardiovascular;  Laterality: N/A;   PERCUTANEOUS CORONARY STENT INTERVENTION (PCI-S)  11/16/2012   Procedure: PERCUTANEOUS CORONARY STENT INTERVENTION (PCI-S);  Surgeon: Sherren Mocha, MD;  Location: [facility] CATH LAB;  Service: Cardiovascular;;   POLYPECTOMY  07/24/2020   Procedure: POLYPECTOMY;  Surgeon: Daneil Dolin, MD;  [facility];  Service: Endoscopy;;   SPINAL CORD STIMULATOR INSERTION N/A 06/10/2022   Procedure: Spinal cord stimulator placement;  Surgeon: Newman Pies, MD;  Location: [place];  Service: Neurosurgery;  Laterality: N/A;  RM 19/ to follow   THUMB AMPUTATION Left    50 years ago   TOTAL KNEE ARTHROPLASTY Right 11/15/2013   Procedure: RIGHT TOTAL KNEE ARTHROPLASTY;  Surgeon: Mauri Pole, MD;  Location: [facility];  Service: Orthopedics;  Laterality: Right;   TOTAL KNEE ARTHROPLASTY Left 03/07/2015   Procedure: LEFT TOTAL KNEE ARTHROPLASTY;  Surgeon: Paralee Cancel, MD;  Location: [facility];  Service: Orthopedics;  Laterality: Left;    Current Medications: Current Meds  Medication Sig   acarbose (PRECOSE) 50 MG tablet Take 50 mg by mouth in  the morning, at noon, and at bedtime.    acetaminophen (TYLENOL) 500 MG tablet Take 2 tablets (1,000 mg total) by mouth every 6 (six) hours.   allopurinol (ZYLOPRIM) 100 MG tablet Take 100 mg by mouth daily.   ascorbic acid (VITAMIN C) 500 MG tablet Take 500 mg by mouth daily.   aspirin EC 81 MG tablet Take 1 tablet (81 mg total) by mouth daily.   B Complex-C-Zn-Folic Acid (DIALYVITE 263-ZCHY 15) 0.8 MG TABS Take 1 tablet by mouth daily.   calcitRIOL (ROCALTROL) 0.25 MCG capsule Take 0.25 mcg by mouth every Sunday.    carboxymethylcellulose (REFRESH PLUS) 0.5 % SOLN Place 1 drop into both eyes daily as needed (dry eyes).   carvedilol (COREG) 3.125 MG tablet Take 1 tablet (3.125 mg total) by mouth 2 (two) times daily.   docusate sodium (COLACE) 100 MG capsule Take 1 capsule (100 mg total) by  mouth 2 (two) times daily.   epoetin alfa (EPOGEN) 3000 UNIT/ML injection Inject into the skin.   ferrous sulfate 325 (65 FE) MG tablet Take 1 tablet (325 mg total) by mouth daily.   hydrALAZINE (APRESOLINE) 25 MG tablet Take 1 tablet (25 mg total) by mouth in the morning and at bedtime.   insulin NPH Human (NOVOLIN N) 100 UNIT/ML injection Inject 0.1 mLs (10 Units total) into the skin every morning. Ands syringes 1/day   isosorbide mononitrate (IMDUR) 30 MG 24 hr tablet Take 0.5 tablets (15 mg total) by mouth daily.   meclizine (ANTIVERT) 12.5 MG tablet Take 1 tablet (12.5 mg total) by mouth 3 (three) times daily as needed for dizziness.   melatonin 5 MG TABS Take 5 mg by mouth at bedtime.   Multiple Vitamins-Minerals (MULTIVITAMIN WITH MINERALS) tablet Take 1 tablet by mouth daily.   nitroGLYCERIN (NITROSTAT) 0.4 MG SL tablet Place 1 tablet (0.4 mg total) under the tongue every 5 (five) minutes as needed for chest pain. PLACE ONE TABLET UNDER THE TONGUE EVERY 5 MINUTES FOR 3 DOSES AS NEEDED Strength: 0.4 mg   polyethylene glycol (MIRALAX / GLYCOLAX) 17 g packet Take 17 g by mouth daily.   pravastatin  (PRAVACHOL) 40 MG tablet Take 1 tablet (40 mg total) by mouth every evening.   promethazine (PHENERGAN) 25 MG suppository Place 25 mg rectally every 12 (twelve) hours as needed for nausea or vomiting.   RESTASIS 0.05 % ophthalmic emulsion Place 1 drop into both eyes 2 (two) times daily.   senna (SENOKOT) 8.6 MG TABS tablet Take 2 tablets (17.2 mg total) by mouth 2 (two) times daily.   simethicone (MYLICON) 80 MG chewable tablet Chew 80 mg by mouth every 6 (six) hours as needed for flatulence.   sitaGLIPtin (JANUVIA) 25 MG tablet Take 25 mg by mouth daily.   traMADol (ULTRAM) 50 MG tablet Take 50 mg by mouth 4 (four) times daily as needed for pain.   TRUE METRIX BLOOD GLUCOSE TEST test strip USE TO TEST BLOOD SUGAR UP TO TWICE DAILY OR AS DIRECTED   Zinc 50 MG TABS Take 1 tablet by mouth daily.     Allergies:   Metformin, Niacin, and Niacin and related   Social History   Socioeconomic History   Marital status: Widowed    Spouse name: Not on file   Number of children: Not on file   Years of education: Not on file   Highest education level: Not on file  Occupational History   Occupation: Disabled    Employer: RETIRED  Tobacco Use   Smoking status: Former    Packs/day: 1.00    Years: 30.00    Total pack years: 30.00    Types: Cigarettes    Start date: 01/22/1965    Quit date: 12/23/1992    Years since quitting: 29.8   Smokeless tobacco: Never  Vaping Use   Vaping Use: Never used  Substance and Sexual Activity   Alcohol use: No    Alcohol/week: 0.0 standard drinks of alcohol   Drug use: No   Sexual activity: Not Currently  Other Topics Concern   Not on file  Social History Narrative   Married   Right handed   One story home no caffeine   Social Determinants of Health   Financial Resource Strain: Not on file  Food Insecurity: No Food Insecurity (09/13/2022)   Hunger Vital Sign    Worried About Running Out of Food in the Last Year:  Never true    Ran Out of Food in the Last  Year: Never true  Transportation Needs: No Transportation Needs (09/13/2022)   PRAPARE - Hydrologist (Medical): No    Lack of Transportation (Non-Medical): No  Physical Activity: Not on file  Stress: Not on file  Social Connections: Not on file     Family History: The patient's family history includes CAD in his brother; Cancer in his father and another family member; Diabetes in an other family member; Heart disease in an other family member; Hypertension in his mother and another family member. There is no history of Colon cancer.   ROS:   Please see the history of present illness.    All other systems reviewed and are negative.   EKGs/Labs/Other Studies Reviewed:    The following studies were reviewed today:  Lexiscan Myoview 05/06/2022:   Findings are consistent with prior inferior myocardial infarction. There is a small apical infarct with mild peri-infarct ischemia.  The study is low risk.   No ST deviation was noted.   LV perfusion is abnormal.   Left ventricular function is normal. Nuclear stress EF: 55 %. The left ventricular ejection fraction is normal (55-65%). End diastolic cavity size is severely enlarged.   Echocardiogram 07/29/2022:  1. Limited echo for LVEF and regional wall motion.   2. No apical thrombus by Definity contrast. Left ventricular ejection  fraction, by estimation, is 35 to 40%. Left ventricular ejection fraction  by 2D MOD biplane is 36.6 %. The left ventricle has moderately decreased  function. The left ventricle  demonstrates severe global hypokinesis, worse at the apex. Regional wall  motion is difficult to ascertain given frequent PVC's.   3. The inferior vena cava is dilated in size with <50% respiratory  variability, suggesting right atrial pressure of 15 mmHg.   4. Right ventricular systolic function was not well visualized. The right  ventricular size is not well visualized.   5. Rhythm strip during this exam  demonstrates atrial fibrillation.   EKG:  EKG is ordered today.  The ekg ordered today demonstrates sinus rhythm, ectopic rhythm  Recent Labs: 05/10/2022: TSH 3.603 08/10/2022: B Natriuretic Peptide 132.0 09/14/2022: ALT 11 09/16/2022: BUN 35; Creatinine, Ser 4.61; Hemoglobin 7.5; Magnesium 1.7; Platelets 235; Potassium 3.4; Sodium 138  Recent Lipid Panel    Component Value Date/Time   CHOL 99 05/10/2022 0340   TRIG 121 05/10/2022 0340   HDL 29 (L) 05/10/2022 0340   CHOLHDL 3.4 05/10/2022 0340   VLDL 24 05/10/2022 0340   LDLCALC 46 05/10/2022 0340     Physical Exam:    VS:  BP (!) 160/78   Pulse 93   Ht 6' 2"  (1.88 m)   Wt 191 lb (86.6 kg)   SpO2 97%   BMI 24.52 kg/m     Wt Readings from Last 3 Encounters:  10/10/22 191 lb (86.6 kg)  09/24/22 220 lb (99.8 kg)  08/23/22 216 lb 4.3 oz (98.1 kg)     GEN:  Well nourished, well developed in no acute distress HEENT: Normal NECK: No JVD; No carotid bruits LYMPHATICS: No lymphadenopathy CARDIAC: RRR, no murmurs, rubs, gallops RESPIRATORY:  Clear to auscultation without rales, wheezing or rhonchi  ABDOMEN: Soft, non-tender, non-distended MUSCULOSKELETAL:  No edema; No deformity  SKIN: Warm and dry NEUROLOGIC:  Alert and oriented x 3 PSYCHIATRIC:  Normal affect   ASSESSMENT AND PLAN:    HFrEF/hypertensive heart dz  01/2022 echo : LVEF  60-65%>>>>Myoview 04/2022 showed inferior infarct and small apical infarct with mild peri-infarct ischemia>>>07/2022 echo LVEF 35-40%>> felt poor candidate for cath due to renal function however now on dialysis.  His guideline-directed medical therapy discontinued September 2023 while admitted for hypotension.  I do not have any documentation about his blood pressure currently during dialysis session.  Blood pressure elevated today.  I will restart at lower dose Coreg 3.125 mg twice daily, hydralazine 25 mg twice daily and Imdur 15 mg daily.  No ACE/ARB or Entresto due to end-stage renal disease.   If he becomes hypotensive during dialysis session, please hold only on dialysis days and continue on nondialysis days. Could Give his medication after his dialysis.  2. CAD  - S/p DES to LAD and D1 in 10/2012 with nonobstructive disease along RCA and LCx. - Myoview 04/2022 showed inferior infarct and small apical infarct with mild peri-infarct ischemia -No chest pain or shortness of breath  3. Hx First degree AV block/Wenchebach -Will start low-dose Coreg and see how he tolerates (previously on 6.25 mg twice daily dose)  4. ESRD on HD -Monday Wednesday Friday dialysis  5.  Surgical clearance -He is tolerating physical therapy at rehab center without any problem.  He is getting 4 METS at that time.  Given reduced LV function in August,  I will reach out to his primary cardiologist prior to final clearance.  Addendum: Per Dr. Harl Bowie "Ok to proceed with surgery " Given past medical history and time since last visit, based on ACC/AHA guidelines, ANGEL WEEDON is at acceptable risk for the planned procedure without further cardiovascular testing.   I will route this recommendation to the requesting party via Epic fax function and remove from pre-op pool.  Please call with questions.   Medication Adjustments/Labs and Tests Ordered: Current medicines are reviewed at length with the patient today.  Concerns regarding medicines are outlined above.  Orders Placed This Encounter  Procedures   EKG 12-Lead   Meds ordered this encounter  Medications   carvedilol (COREG) 3.125 MG tablet    Sig: Take 1 tablet (3.125 mg total) by mouth 2 (two) times daily.    Dispense:  60 tablet    Refill:  2   hydrALAZINE (APRESOLINE) 25 MG tablet    Sig: Take 1 tablet (25 mg total) by mouth in the morning and at bedtime.    Dispense:  60 tablet    Refill:  2   isosorbide mononitrate (IMDUR) 30 MG 24 hr tablet    Sig: Take 0.5 tablets (15 mg total) by mouth daily.    Dispense:  30 tablet    Refill:  2     Patient Instructions  Medication Instructions:  START Coreg 3.188m take 1 tablet twice a day  START Hydralazine 231mTake 1 tablet twice a day START Imdur 1575make 1 tablet daily (tablet may only come in a 75m27m you will need to break it in half)   *If you need a refill on your cardiac medications before your next appointment, please call your pharmacy*   Lab Work: None ordered   Testing/Procedures: None ordered   Follow-Up: [facility] and your health needs are our priority.  As part of our continuing mission to provide you with exceptional heart care, we have created designated Provider Care Teams.  These Care Teams include your primary Cardiologist (physician) and Advanced Practice Providers (APPs -  Physician Assistants and Nurse Practitioners) who all work together to provide  you with the care you need, when you need it.  We recommend signing up for the patient portal called "MyChart".  Sign up information is provided on this After Visit Summary.  MyChart is used to connect with patients for Virtual Visits (Telemedicine).  Patients are able to view lab/test results, encounter notes, upcoming appointments, etc.  Non-urgent messages can be sent to your provider as well.   To learn more about what you can do with MyChart, go to [email or web address].    Your next appointment:   3 month(s)  The format for your next appointment:   In Person  Provider:   You may see Carlyle Dolly, MD or one of the following Advanced Practice Providers on your designated Care Team:   Bernerd Pho, PA-C  Ermalinda Barrios, PA-C     Other Instructions   Important Information About Sugar         Signed, Leanor Kail, [place]  10/10/2022 2:08 PM    [place]

## 2022-10-11 DIAGNOSIS — I2510 Atherosclerotic heart disease of native coronary artery without angina pectoris: Secondary | ICD-10-CM | POA: Diagnosis not present

## 2022-10-11 DIAGNOSIS — D689 Coagulation defect, unspecified: Secondary | ICD-10-CM | POA: Diagnosis not present

## 2022-10-11 DIAGNOSIS — D631 Anemia in chronic kidney disease: Secondary | ICD-10-CM | POA: Diagnosis not present

## 2022-10-11 DIAGNOSIS — I10 Essential (primary) hypertension: Secondary | ICD-10-CM | POA: Diagnosis not present

## 2022-10-11 DIAGNOSIS — E039 Hypothyroidism, unspecified: Secondary | ICD-10-CM | POA: Diagnosis not present

## 2022-10-11 DIAGNOSIS — I5042 Chronic combined systolic (congestive) and diastolic (congestive) heart failure: Secondary | ICD-10-CM | POA: Diagnosis not present

## 2022-10-11 DIAGNOSIS — R110 Nausea: Secondary | ICD-10-CM | POA: Diagnosis not present

## 2022-10-11 DIAGNOSIS — N186 End stage renal disease: Secondary | ICD-10-CM | POA: Diagnosis not present

## 2022-10-11 DIAGNOSIS — Z992 Dependence on renal dialysis: Secondary | ICD-10-CM | POA: Diagnosis not present

## 2022-10-11 DIAGNOSIS — E1122 Type 2 diabetes mellitus with diabetic chronic kidney disease: Secondary | ICD-10-CM | POA: Diagnosis not present

## 2022-10-11 DIAGNOSIS — R52 Pain, unspecified: Secondary | ICD-10-CM | POA: Diagnosis not present

## 2022-10-11 DIAGNOSIS — N2581 Secondary hyperparathyroidism of renal origin: Secondary | ICD-10-CM | POA: Diagnosis not present

## 2022-10-11 DIAGNOSIS — Z23 Encounter for immunization: Secondary | ICD-10-CM | POA: Diagnosis not present

## 2022-10-14 DIAGNOSIS — D689 Coagulation defect, unspecified: Secondary | ICD-10-CM | POA: Diagnosis not present

## 2022-10-14 DIAGNOSIS — E039 Hypothyroidism, unspecified: Secondary | ICD-10-CM | POA: Diagnosis not present

## 2022-10-14 DIAGNOSIS — D631 Anemia in chronic kidney disease: Secondary | ICD-10-CM | POA: Diagnosis not present

## 2022-10-14 DIAGNOSIS — R110 Nausea: Secondary | ICD-10-CM | POA: Diagnosis not present

## 2022-10-14 DIAGNOSIS — N2581 Secondary hyperparathyroidism of renal origin: Secondary | ICD-10-CM | POA: Diagnosis not present

## 2022-10-14 DIAGNOSIS — Z23 Encounter for immunization: Secondary | ICD-10-CM | POA: Diagnosis not present

## 2022-10-14 DIAGNOSIS — E1122 Type 2 diabetes mellitus with diabetic chronic kidney disease: Secondary | ICD-10-CM | POA: Diagnosis not present

## 2022-10-14 DIAGNOSIS — N186 End stage renal disease: Secondary | ICD-10-CM | POA: Diagnosis not present

## 2022-10-14 DIAGNOSIS — R52 Pain, unspecified: Secondary | ICD-10-CM | POA: Diagnosis not present

## 2022-10-14 DIAGNOSIS — Z992 Dependence on renal dialysis: Secondary | ICD-10-CM | POA: Diagnosis not present

## 2022-10-15 DIAGNOSIS — M6281 Muscle weakness (generalized): Secondary | ICD-10-CM | POA: Diagnosis not present

## 2022-10-15 DIAGNOSIS — R278 Other lack of coordination: Secondary | ICD-10-CM | POA: Diagnosis not present

## 2022-10-15 DIAGNOSIS — R2689 Other abnormalities of gait and mobility: Secondary | ICD-10-CM | POA: Diagnosis not present

## 2022-10-16 ENCOUNTER — Telehealth (INDEPENDENT_AMBULATORY_CARE_PROVIDER_SITE_OTHER): Payer: Medicare Other | Admitting: *Deleted

## 2022-10-16 DIAGNOSIS — D631 Anemia in chronic kidney disease: Secondary | ICD-10-CM | POA: Diagnosis not present

## 2022-10-16 DIAGNOSIS — R110 Nausea: Secondary | ICD-10-CM | POA: Diagnosis not present

## 2022-10-16 DIAGNOSIS — E039 Hypothyroidism, unspecified: Secondary | ICD-10-CM | POA: Diagnosis not present

## 2022-10-16 DIAGNOSIS — R52 Pain, unspecified: Secondary | ICD-10-CM | POA: Diagnosis not present

## 2022-10-16 DIAGNOSIS — K8020 Calculus of gallbladder without cholecystitis without obstruction: Secondary | ICD-10-CM

## 2022-10-16 DIAGNOSIS — Z992 Dependence on renal dialysis: Secondary | ICD-10-CM | POA: Diagnosis not present

## 2022-10-16 DIAGNOSIS — I5032 Chronic diastolic (congestive) heart failure: Secondary | ICD-10-CM | POA: Diagnosis not present

## 2022-10-16 DIAGNOSIS — L89152 Pressure ulcer of sacral region, stage 2: Secondary | ICD-10-CM | POA: Diagnosis not present

## 2022-10-16 DIAGNOSIS — Z23 Encounter for immunization: Secondary | ICD-10-CM | POA: Diagnosis not present

## 2022-10-16 DIAGNOSIS — N186 End stage renal disease: Secondary | ICD-10-CM | POA: Diagnosis not present

## 2022-10-16 DIAGNOSIS — D649 Anemia, unspecified: Secondary | ICD-10-CM | POA: Diagnosis not present

## 2022-10-16 DIAGNOSIS — M6281 Muscle weakness (generalized): Secondary | ICD-10-CM | POA: Diagnosis not present

## 2022-10-16 DIAGNOSIS — R278 Other lack of coordination: Secondary | ICD-10-CM | POA: Diagnosis not present

## 2022-10-16 DIAGNOSIS — E1169 Type 2 diabetes mellitus with other specified complication: Secondary | ICD-10-CM | POA: Diagnosis not present

## 2022-10-16 DIAGNOSIS — D689 Coagulation defect, unspecified: Secondary | ICD-10-CM | POA: Diagnosis not present

## 2022-10-16 DIAGNOSIS — R2689 Other abnormalities of gait and mobility: Secondary | ICD-10-CM | POA: Diagnosis not present

## 2022-10-16 DIAGNOSIS — N2581 Secondary hyperparathyroidism of renal origin: Secondary | ICD-10-CM | POA: Diagnosis not present

## 2022-10-16 DIAGNOSIS — E1122 Type 2 diabetes mellitus with diabetic chronic kidney disease: Secondary | ICD-10-CM | POA: Diagnosis not present

## 2022-10-17 ENCOUNTER — Other Ambulatory Visit (HOSPITAL_COMMUNITY): Payer: Self-pay | Admitting: Student

## 2022-10-17 ENCOUNTER — Ambulatory Visit (HOSPITAL_COMMUNITY)
Admission: RE | Admit: 2022-10-17 | Discharge: 2022-10-17 | Disposition: A | Discharge status: Home / Self Care | Payer: Medicare Other | Source: Ambulatory Visit | Attending: Student | Admitting: Student

## 2022-10-17 DIAGNOSIS — K810 Acute cholecystitis: Secondary | ICD-10-CM

## 2022-10-17 DIAGNOSIS — Z434 Encounter for attention to other artificial openings of digestive tract: Secondary | ICD-10-CM | POA: Insufficient documentation

## 2022-10-17 DIAGNOSIS — K8020 Calculus of gallbladder without cholecystitis without obstruction: Secondary | ICD-10-CM | POA: Diagnosis not present

## 2022-10-17 HISTORY — PX: IR EXCHANGE BILIARY DRAIN: IMG6046

## 2022-10-17 MED ORDER — LIDOCAINE HCL 1 % IJ SOLN
INTRAMUSCULAR | Status: AC
  Administered 2022-10-17: 5 mL
  Filled 2022-10-17: qty 20

## 2022-10-17 MED ORDER — IOHEXOL 300 MG/ML  SOLN
50.0000 mL | Freq: Once | INTRAMUSCULAR | Status: AC | PRN
  Administered 2022-10-17: 15 mL

## 2022-10-17 NOTE — Procedures (Signed)
Interventional Radiology Procedure:   Indications: Follow up cholecystostomy tube  Procedure: Drain injection and exchange  Findings: Stone filled gallbladder.  Cystic duct and common bile duct are patent.  New 10 Fr drain placed in gallbladder  Complications: No immediate complications noted.     EBL: Minimal  Plan: Discussed capping trial but elected to continue with external drainage.  Patient is scheduled to follow up with surgery in approximately 3 weeks.  If patient is not a surgical candidate then we set patient up for a [facility] consultation.      Reshad Saab R. Anselm Pancoast, MD  Pager: [phone]

## 2022-10-18 DIAGNOSIS — R278 Other lack of coordination: Secondary | ICD-10-CM | POA: Diagnosis not present

## 2022-10-18 DIAGNOSIS — M6281 Muscle weakness (generalized): Secondary | ICD-10-CM | POA: Diagnosis not present

## 2022-10-18 DIAGNOSIS — E039 Hypothyroidism, unspecified: Secondary | ICD-10-CM | POA: Diagnosis not present

## 2022-10-18 DIAGNOSIS — D689 Coagulation defect, unspecified: Secondary | ICD-10-CM | POA: Diagnosis not present

## 2022-10-18 DIAGNOSIS — N2581 Secondary hyperparathyroidism of renal origin: Secondary | ICD-10-CM | POA: Diagnosis not present

## 2022-10-18 DIAGNOSIS — Z992 Dependence on renal dialysis: Secondary | ICD-10-CM | POA: Diagnosis not present

## 2022-10-18 DIAGNOSIS — R110 Nausea: Secondary | ICD-10-CM | POA: Diagnosis not present

## 2022-10-18 DIAGNOSIS — R2689 Other abnormalities of gait and mobility: Secondary | ICD-10-CM | POA: Diagnosis not present

## 2022-10-18 DIAGNOSIS — Z23 Encounter for immunization: Secondary | ICD-10-CM | POA: Diagnosis not present

## 2022-10-18 DIAGNOSIS — E1122 Type 2 diabetes mellitus with diabetic chronic kidney disease: Secondary | ICD-10-CM | POA: Diagnosis not present

## 2022-10-18 DIAGNOSIS — D631 Anemia in chronic kidney disease: Secondary | ICD-10-CM | POA: Diagnosis not present

## 2022-10-18 DIAGNOSIS — R52 Pain, unspecified: Secondary | ICD-10-CM | POA: Diagnosis not present

## 2022-10-18 DIAGNOSIS — N186 End stage renal disease: Secondary | ICD-10-CM | POA: Diagnosis not present

## 2022-10-20 DIAGNOSIS — R278 Other lack of coordination: Secondary | ICD-10-CM | POA: Diagnosis not present

## 2022-10-20 DIAGNOSIS — R2689 Other abnormalities of gait and mobility: Secondary | ICD-10-CM | POA: Diagnosis not present

## 2022-10-20 DIAGNOSIS — M6281 Muscle weakness (generalized): Secondary | ICD-10-CM | POA: Diagnosis not present

## 2022-10-21 DIAGNOSIS — D631 Anemia in chronic kidney disease: Secondary | ICD-10-CM | POA: Diagnosis not present

## 2022-10-21 DIAGNOSIS — N2581 Secondary hyperparathyroidism of renal origin: Secondary | ICD-10-CM | POA: Diagnosis not present

## 2022-10-21 DIAGNOSIS — R2689 Other abnormalities of gait and mobility: Secondary | ICD-10-CM | POA: Diagnosis not present

## 2022-10-21 DIAGNOSIS — E039 Hypothyroidism, unspecified: Secondary | ICD-10-CM | POA: Diagnosis not present

## 2022-10-21 DIAGNOSIS — M6281 Muscle weakness (generalized): Secondary | ICD-10-CM | POA: Diagnosis not present

## 2022-10-21 DIAGNOSIS — R278 Other lack of coordination: Secondary | ICD-10-CM | POA: Diagnosis not present

## 2022-10-21 DIAGNOSIS — D689 Coagulation defect, unspecified: Secondary | ICD-10-CM | POA: Diagnosis not present

## 2022-10-21 DIAGNOSIS — N186 End stage renal disease: Secondary | ICD-10-CM | POA: Diagnosis not present

## 2022-10-21 DIAGNOSIS — E119 Type 2 diabetes mellitus without complications: Secondary | ICD-10-CM | POA: Diagnosis not present

## 2022-10-21 DIAGNOSIS — Z434 Encounter for attention to other artificial openings of digestive tract: Secondary | ICD-10-CM | POA: Diagnosis not present

## 2022-10-21 DIAGNOSIS — R52 Pain, unspecified: Secondary | ICD-10-CM | POA: Diagnosis not present

## 2022-10-21 DIAGNOSIS — Z23 Encounter for immunization: Secondary | ICD-10-CM | POA: Diagnosis not present

## 2022-10-21 DIAGNOSIS — Z992 Dependence on renal dialysis: Secondary | ICD-10-CM | POA: Diagnosis not present

## 2022-10-21 DIAGNOSIS — E1122 Type 2 diabetes mellitus with diabetic chronic kidney disease: Secondary | ICD-10-CM | POA: Diagnosis not present

## 2022-10-21 DIAGNOSIS — R110 Nausea: Secondary | ICD-10-CM | POA: Diagnosis not present

## 2022-10-21 DIAGNOSIS — K810 Acute cholecystitis: Secondary | ICD-10-CM | POA: Diagnosis not present

## 2022-10-22 DIAGNOSIS — Z992 Dependence on renal dialysis: Secondary | ICD-10-CM | POA: Diagnosis not present

## 2022-10-22 DIAGNOSIS — I5042 Chronic combined systolic (congestive) and diastolic (congestive) heart failure: Secondary | ICD-10-CM | POA: Diagnosis not present

## 2022-10-22 DIAGNOSIS — K810 Acute cholecystitis: Secondary | ICD-10-CM | POA: Diagnosis not present

## 2022-10-22 DIAGNOSIS — N186 End stage renal disease: Secondary | ICD-10-CM | POA: Diagnosis not present

## 2022-10-22 DIAGNOSIS — Z434 Encounter for attention to other artificial openings of digestive tract: Secondary | ICD-10-CM | POA: Diagnosis not present

## 2022-10-22 DIAGNOSIS — M6281 Muscle weakness (generalized): Secondary | ICD-10-CM | POA: Diagnosis not present

## 2022-10-22 DIAGNOSIS — E1122 Type 2 diabetes mellitus with diabetic chronic kidney disease: Secondary | ICD-10-CM | POA: Diagnosis not present

## 2022-10-22 DIAGNOSIS — I132 Hypertensive heart and chronic kidney disease with heart failure and with stage 5 chronic kidney disease, or end stage renal disease: Secondary | ICD-10-CM | POA: Diagnosis not present

## 2022-10-22 DIAGNOSIS — R278 Other lack of coordination: Secondary | ICD-10-CM | POA: Diagnosis not present

## 2022-10-22 DIAGNOSIS — R2689 Other abnormalities of gait and mobility: Secondary | ICD-10-CM | POA: Diagnosis not present

## 2022-10-23 ENCOUNTER — Other Ambulatory Visit: Payer: Self-pay | Admitting: *Deleted

## 2022-10-23 DIAGNOSIS — E1122 Type 2 diabetes mellitus with diabetic chronic kidney disease: Secondary | ICD-10-CM | POA: Diagnosis not present

## 2022-10-23 DIAGNOSIS — N186 End stage renal disease: Secondary | ICD-10-CM | POA: Diagnosis not present

## 2022-10-23 DIAGNOSIS — D689 Coagulation defect, unspecified: Secondary | ICD-10-CM | POA: Diagnosis not present

## 2022-10-23 DIAGNOSIS — R2689 Other abnormalities of gait and mobility: Secondary | ICD-10-CM | POA: Diagnosis not present

## 2022-10-23 DIAGNOSIS — Z992 Dependence on renal dialysis: Secondary | ICD-10-CM | POA: Diagnosis not present

## 2022-10-23 DIAGNOSIS — E039 Hypothyroidism, unspecified: Secondary | ICD-10-CM | POA: Diagnosis not present

## 2022-10-23 DIAGNOSIS — N2581 Secondary hyperparathyroidism of renal origin: Secondary | ICD-10-CM | POA: Diagnosis not present

## 2022-10-23 DIAGNOSIS — M6281 Muscle weakness (generalized): Secondary | ICD-10-CM | POA: Diagnosis not present

## 2022-10-23 DIAGNOSIS — D631 Anemia in chronic kidney disease: Secondary | ICD-10-CM | POA: Diagnosis not present

## 2022-10-23 DIAGNOSIS — R278 Other lack of coordination: Secondary | ICD-10-CM | POA: Diagnosis not present

## 2022-10-23 NOTE — Patient Outreach (Signed)
Per [facility] Mr. Poupard resides in [facility]. Screening for potential [facility] care coordination services as benefit of insurance plan and PCP.   Secure communication sent to [facility] social worker to make aware writer is following for potential [facility] care coordination needs and transition plans.   Will continue to follow.    Marthenia Rolling, MSN, RN,BSN [place] Acute Care Coordinator [phone] (Direct dial)

## 2022-10-24 DIAGNOSIS — M6281 Muscle weakness (generalized): Secondary | ICD-10-CM | POA: Diagnosis not present

## 2022-10-24 DIAGNOSIS — R2689 Other abnormalities of gait and mobility: Secondary | ICD-10-CM | POA: Diagnosis not present

## 2022-10-24 DIAGNOSIS — R278 Other lack of coordination: Secondary | ICD-10-CM | POA: Diagnosis not present

## 2022-10-25 DIAGNOSIS — Z992 Dependence on renal dialysis: Secondary | ICD-10-CM | POA: Diagnosis not present

## 2022-10-25 DIAGNOSIS — M6281 Muscle weakness (generalized): Secondary | ICD-10-CM | POA: Diagnosis not present

## 2022-10-25 DIAGNOSIS — E1122 Type 2 diabetes mellitus with diabetic chronic kidney disease: Secondary | ICD-10-CM | POA: Diagnosis not present

## 2022-10-25 DIAGNOSIS — N186 End stage renal disease: Secondary | ICD-10-CM | POA: Diagnosis not present

## 2022-10-25 DIAGNOSIS — D689 Coagulation defect, unspecified: Secondary | ICD-10-CM | POA: Diagnosis not present

## 2022-10-25 DIAGNOSIS — D631 Anemia in chronic kidney disease: Secondary | ICD-10-CM | POA: Diagnosis not present

## 2022-10-25 DIAGNOSIS — N2581 Secondary hyperparathyroidism of renal origin: Secondary | ICD-10-CM | POA: Diagnosis not present

## 2022-10-25 DIAGNOSIS — E039 Hypothyroidism, unspecified: Secondary | ICD-10-CM | POA: Diagnosis not present

## 2022-10-25 DIAGNOSIS — R278 Other lack of coordination: Secondary | ICD-10-CM | POA: Diagnosis not present

## 2022-10-25 DIAGNOSIS — R2689 Other abnormalities of gait and mobility: Secondary | ICD-10-CM | POA: Diagnosis not present

## 2022-10-27 DIAGNOSIS — R2689 Other abnormalities of gait and mobility: Secondary | ICD-10-CM | POA: Diagnosis not present

## 2022-10-27 DIAGNOSIS — R278 Other lack of coordination: Secondary | ICD-10-CM | POA: Diagnosis not present

## 2022-10-27 DIAGNOSIS — M6281 Muscle weakness (generalized): Secondary | ICD-10-CM | POA: Diagnosis not present

## 2022-10-28 DIAGNOSIS — M5136 Other intervertebral disc degeneration, lumbar region: Secondary | ICD-10-CM | POA: Diagnosis not present

## 2022-10-28 DIAGNOSIS — E785 Hyperlipidemia, unspecified: Secondary | ICD-10-CM | POA: Diagnosis not present

## 2022-10-28 DIAGNOSIS — K810 Acute cholecystitis: Secondary | ICD-10-CM | POA: Diagnosis not present

## 2022-10-28 DIAGNOSIS — N2581 Secondary hyperparathyroidism of renal origin: Secondary | ICD-10-CM | POA: Diagnosis not present

## 2022-10-28 DIAGNOSIS — D638 Anemia in other chronic diseases classified elsewhere: Secondary | ICD-10-CM | POA: Diagnosis not present

## 2022-10-28 DIAGNOSIS — D689 Coagulation defect, unspecified: Secondary | ICD-10-CM | POA: Diagnosis not present

## 2022-10-28 DIAGNOSIS — E1122 Type 2 diabetes mellitus with diabetic chronic kidney disease: Secondary | ICD-10-CM | POA: Diagnosis not present

## 2022-10-28 DIAGNOSIS — R5381 Other malaise: Secondary | ICD-10-CM | POA: Diagnosis not present

## 2022-10-28 DIAGNOSIS — M6281 Muscle weakness (generalized): Secondary | ICD-10-CM | POA: Diagnosis not present

## 2022-10-28 DIAGNOSIS — R2689 Other abnormalities of gait and mobility: Secondary | ICD-10-CM | POA: Diagnosis not present

## 2022-10-28 DIAGNOSIS — E039 Hypothyroidism, unspecified: Secondary | ICD-10-CM | POA: Diagnosis not present

## 2022-10-28 DIAGNOSIS — Z992 Dependence on renal dialysis: Secondary | ICD-10-CM | POA: Diagnosis not present

## 2022-10-28 DIAGNOSIS — Z794 Long term (current) use of insulin: Secondary | ICD-10-CM | POA: Diagnosis not present

## 2022-10-28 DIAGNOSIS — M549 Dorsalgia, unspecified: Secondary | ICD-10-CM | POA: Diagnosis not present

## 2022-10-28 DIAGNOSIS — R278 Other lack of coordination: Secondary | ICD-10-CM | POA: Diagnosis not present

## 2022-10-28 DIAGNOSIS — E119 Type 2 diabetes mellitus without complications: Secondary | ICD-10-CM | POA: Diagnosis not present

## 2022-10-28 DIAGNOSIS — D631 Anemia in chronic kidney disease: Secondary | ICD-10-CM | POA: Diagnosis not present

## 2022-10-28 DIAGNOSIS — I10 Essential (primary) hypertension: Secondary | ICD-10-CM | POA: Diagnosis not present

## 2022-10-28 DIAGNOSIS — N186 End stage renal disease: Secondary | ICD-10-CM | POA: Diagnosis not present

## 2022-10-28 DIAGNOSIS — I5042 Chronic combined systolic (congestive) and diastolic (congestive) heart failure: Secondary | ICD-10-CM | POA: Diagnosis not present

## 2022-10-29 DIAGNOSIS — R278 Other lack of coordination: Secondary | ICD-10-CM | POA: Diagnosis not present

## 2022-10-29 DIAGNOSIS — R2689 Other abnormalities of gait and mobility: Secondary | ICD-10-CM | POA: Diagnosis not present

## 2022-10-29 DIAGNOSIS — M6281 Muscle weakness (generalized): Secondary | ICD-10-CM | POA: Diagnosis not present

## 2022-10-30 DIAGNOSIS — D631 Anemia in chronic kidney disease: Secondary | ICD-10-CM | POA: Diagnosis not present

## 2022-10-30 DIAGNOSIS — N186 End stage renal disease: Secondary | ICD-10-CM | POA: Diagnosis not present

## 2022-10-30 DIAGNOSIS — M6281 Muscle weakness (generalized): Secondary | ICD-10-CM | POA: Diagnosis not present

## 2022-10-30 DIAGNOSIS — R2689 Other abnormalities of gait and mobility: Secondary | ICD-10-CM | POA: Diagnosis not present

## 2022-10-30 DIAGNOSIS — Z992 Dependence on renal dialysis: Secondary | ICD-10-CM | POA: Diagnosis not present

## 2022-10-30 DIAGNOSIS — E039 Hypothyroidism, unspecified: Secondary | ICD-10-CM | POA: Diagnosis not present

## 2022-10-30 DIAGNOSIS — D689 Coagulation defect, unspecified: Secondary | ICD-10-CM | POA: Diagnosis not present

## 2022-10-30 DIAGNOSIS — R278 Other lack of coordination: Secondary | ICD-10-CM | POA: Diagnosis not present

## 2022-10-30 DIAGNOSIS — N2581 Secondary hyperparathyroidism of renal origin: Secondary | ICD-10-CM | POA: Diagnosis not present

## 2022-10-30 DIAGNOSIS — E1122 Type 2 diabetes mellitus with diabetic chronic kidney disease: Secondary | ICD-10-CM | POA: Diagnosis not present

## 2022-10-31 DIAGNOSIS — R69 Illness, unspecified: Secondary | ICD-10-CM | POA: Diagnosis not present

## 2022-10-31 DIAGNOSIS — R5381 Other malaise: Secondary | ICD-10-CM | POA: Diagnosis not present

## 2022-10-31 NOTE — Telephone Encounter (Signed)
[facility]  Patient just out of [place] Cholecystostomy tube dislodged with a fall. No other injuries per family. IR study showed patent cystic and common bile duct with stone filled gallbladder. Ok to leave the drain out for now. He is an acceptable risk of surgery per cardiology.  Will see him next week to get him scheduled for surgery. If he has any pain, fevers, chills, issues with eating, nausea/vomiting, go to the hospital.  [name], MD [facility] [place], [place] [address] [phone] (office)

## 2022-11-01 ENCOUNTER — Telehealth: Payer: Self-pay | Admitting: *Deleted

## 2022-11-01 DIAGNOSIS — Z434 Encounter for attention to other artificial openings of digestive tract: Secondary | ICD-10-CM | POA: Diagnosis not present

## 2022-11-01 DIAGNOSIS — Z992 Dependence on renal dialysis: Secondary | ICD-10-CM | POA: Diagnosis not present

## 2022-11-01 DIAGNOSIS — E039 Hypothyroidism, unspecified: Secondary | ICD-10-CM | POA: Diagnosis not present

## 2022-11-01 DIAGNOSIS — D638 Anemia in other chronic diseases classified elsewhere: Secondary | ICD-10-CM | POA: Diagnosis not present

## 2022-11-01 DIAGNOSIS — K810 Acute cholecystitis: Secondary | ICD-10-CM | POA: Diagnosis not present

## 2022-11-01 DIAGNOSIS — E1122 Type 2 diabetes mellitus with diabetic chronic kidney disease: Secondary | ICD-10-CM | POA: Diagnosis not present

## 2022-11-01 DIAGNOSIS — N2581 Secondary hyperparathyroidism of renal origin: Secondary | ICD-10-CM | POA: Diagnosis not present

## 2022-11-01 DIAGNOSIS — R5381 Other malaise: Secondary | ICD-10-CM | POA: Diagnosis not present

## 2022-11-01 DIAGNOSIS — E119 Type 2 diabetes mellitus without complications: Secondary | ICD-10-CM | POA: Diagnosis not present

## 2022-11-01 DIAGNOSIS — D689 Coagulation defect, unspecified: Secondary | ICD-10-CM | POA: Diagnosis not present

## 2022-11-01 DIAGNOSIS — I5042 Chronic combined systolic (congestive) and diastolic (congestive) heart failure: Secondary | ICD-10-CM | POA: Diagnosis not present

## 2022-11-01 DIAGNOSIS — K811 Chronic cholecystitis: Secondary | ICD-10-CM | POA: Diagnosis not present

## 2022-11-01 DIAGNOSIS — D631 Anemia in chronic kidney disease: Secondary | ICD-10-CM | POA: Diagnosis not present

## 2022-11-01 DIAGNOSIS — N186 End stage renal disease: Secondary | ICD-10-CM | POA: Diagnosis not present

## 2022-11-01 NOTE — Telephone Encounter (Signed)
  [name] Cagey, MD      10/31/22  3:28 PM Note [facility]   Patient just out of [place] Cholecystostomy tube dislodged with a fall. No other injuries per family. IR study showed patent cystic and common bile duct with stone filled gallbladder. Ok to leave the drain out for now. He is an acceptable risk of surgery per cardiology.   Will see him next week to get him scheduled for surgery. If he has any pain, fevers, chills, issues with eating, nausea/vomiting, go to the hospital.   [name], MD [facility] [place],  [id number] [phone] (office)

## 2022-11-02 DIAGNOSIS — W19XXXA Unspecified fall, initial encounter: Secondary | ICD-10-CM | POA: Diagnosis not present

## 2022-11-02 DIAGNOSIS — E1122 Type 2 diabetes mellitus with diabetic chronic kidney disease: Secondary | ICD-10-CM | POA: Diagnosis not present

## 2022-11-02 DIAGNOSIS — I5042 Chronic combined systolic (congestive) and diastolic (congestive) heart failure: Secondary | ICD-10-CM | POA: Diagnosis not present

## 2022-11-02 DIAGNOSIS — M79605 Pain in left leg: Secondary | ICD-10-CM | POA: Diagnosis not present

## 2022-11-02 DIAGNOSIS — E1140 Type 2 diabetes mellitus with diabetic neuropathy, unspecified: Secondary | ICD-10-CM | POA: Diagnosis not present

## 2022-11-02 DIAGNOSIS — F32A Depression, unspecified: Secondary | ICD-10-CM | POA: Diagnosis not present

## 2022-11-02 DIAGNOSIS — G8929 Other chronic pain: Secondary | ICD-10-CM | POA: Diagnosis not present

## 2022-11-02 DIAGNOSIS — I2510 Atherosclerotic heart disease of native coronary artery without angina pectoris: Secondary | ICD-10-CM | POA: Diagnosis not present

## 2022-11-02 DIAGNOSIS — Z434 Encounter for attention to other artificial openings of digestive tract: Secondary | ICD-10-CM | POA: Diagnosis not present

## 2022-11-02 DIAGNOSIS — M549 Dorsalgia, unspecified: Secondary | ICD-10-CM | POA: Diagnosis not present

## 2022-11-02 DIAGNOSIS — K219 Gastro-esophageal reflux disease without esophagitis: Secondary | ICD-10-CM | POA: Diagnosis not present

## 2022-11-02 DIAGNOSIS — K810 Acute cholecystitis: Secondary | ICD-10-CM | POA: Diagnosis not present

## 2022-11-02 DIAGNOSIS — M5136 Other intervertebral disc degeneration, lumbar region: Secondary | ICD-10-CM | POA: Diagnosis not present

## 2022-11-02 DIAGNOSIS — N186 End stage renal disease: Secondary | ICD-10-CM | POA: Diagnosis not present

## 2022-11-02 DIAGNOSIS — Z794 Long term (current) use of insulin: Secondary | ICD-10-CM | POA: Diagnosis not present

## 2022-11-02 DIAGNOSIS — M1A30X0 Chronic gout due to renal impairment, unspecified site, without tophus (tophi): Secondary | ICD-10-CM | POA: Diagnosis not present

## 2022-11-02 DIAGNOSIS — M1990 Unspecified osteoarthritis, unspecified site: Secondary | ICD-10-CM | POA: Diagnosis not present

## 2022-11-02 DIAGNOSIS — T68XXXA Hypothermia, initial encounter: Secondary | ICD-10-CM | POA: Diagnosis not present

## 2022-11-02 DIAGNOSIS — L89152 Pressure ulcer of sacral region, stage 2: Secondary | ICD-10-CM | POA: Diagnosis not present

## 2022-11-02 DIAGNOSIS — E782 Mixed hyperlipidemia: Secondary | ICD-10-CM | POA: Diagnosis not present

## 2022-11-02 DIAGNOSIS — Z7984 Long term (current) use of oral hypoglycemic drugs: Secondary | ICD-10-CM | POA: Diagnosis not present

## 2022-11-02 DIAGNOSIS — R52 Pain, unspecified: Secondary | ICD-10-CM | POA: Diagnosis not present

## 2022-11-02 DIAGNOSIS — D631 Anemia in chronic kidney disease: Secondary | ICD-10-CM | POA: Diagnosis not present

## 2022-11-02 DIAGNOSIS — I132 Hypertensive heart and chronic kidney disease with heart failure and with stage 5 chronic kidney disease, or end stage renal disease: Secondary | ICD-10-CM | POA: Diagnosis not present

## 2022-11-02 DIAGNOSIS — R42 Dizziness and giddiness: Secondary | ICD-10-CM | POA: Diagnosis not present

## 2022-11-02 DIAGNOSIS — I480 Paroxysmal atrial fibrillation: Secondary | ICD-10-CM | POA: Diagnosis not present

## 2022-11-04 ENCOUNTER — Emergency Department (HOSPITAL_COMMUNITY)
Admission: EM | Admit: 2022-11-04 | Discharge: 2022-11-06 | Disposition: A | Discharge status: Skilled Nursing Facility | Payer: Medicare Other | Attending: Emergency Medicine | Admitting: Emergency Medicine

## 2022-11-04 ENCOUNTER — Encounter (HOSPITAL_COMMUNITY): Payer: Self-pay

## 2022-11-04 ENCOUNTER — Emergency Department (HOSPITAL_COMMUNITY): Payer: Medicare Other

## 2022-11-04 DIAGNOSIS — W19XXXA Unspecified fall, initial encounter: Secondary | ICD-10-CM | POA: Insufficient documentation

## 2022-11-04 DIAGNOSIS — Y92009 Unspecified place in unspecified non-institutional (private) residence as the place of occurrence of the external cause: Secondary | ICD-10-CM | POA: Diagnosis not present

## 2022-11-04 DIAGNOSIS — T699XXA Effect of reduced temperature, unspecified, initial encounter: Secondary | ICD-10-CM | POA: Diagnosis not present

## 2022-11-04 DIAGNOSIS — R1011 Right upper quadrant pain: Secondary | ICD-10-CM | POA: Diagnosis not present

## 2022-11-04 DIAGNOSIS — I509 Heart failure, unspecified: Secondary | ICD-10-CM | POA: Insufficient documentation

## 2022-11-04 DIAGNOSIS — K769 Liver disease, unspecified: Secondary | ICD-10-CM | POA: Diagnosis not present

## 2022-11-04 DIAGNOSIS — M47812 Spondylosis without myelopathy or radiculopathy, cervical region: Secondary | ICD-10-CM | POA: Diagnosis not present

## 2022-11-04 DIAGNOSIS — N189 Chronic kidney disease, unspecified: Secondary | ICD-10-CM | POA: Diagnosis not present

## 2022-11-04 DIAGNOSIS — R0689 Other abnormalities of breathing: Secondary | ICD-10-CM | POA: Diagnosis not present

## 2022-11-04 DIAGNOSIS — Z043 Encounter for examination and observation following other accident: Secondary | ICD-10-CM | POA: Diagnosis not present

## 2022-11-04 DIAGNOSIS — M5124 Other intervertebral disc displacement, thoracic region: Secondary | ICD-10-CM | POA: Diagnosis not present

## 2022-11-04 DIAGNOSIS — Z1152 Encounter for screening for COVID-19: Secondary | ICD-10-CM | POA: Diagnosis not present

## 2022-11-04 DIAGNOSIS — R531 Weakness: Secondary | ICD-10-CM | POA: Insufficient documentation

## 2022-11-04 DIAGNOSIS — M4802 Spinal stenosis, cervical region: Secondary | ICD-10-CM | POA: Insufficient documentation

## 2022-11-04 DIAGNOSIS — R5383 Other fatigue: Secondary | ICD-10-CM | POA: Diagnosis not present

## 2022-11-04 DIAGNOSIS — I130 Hypertensive heart and chronic kidney disease with heart failure and stage 1 through stage 4 chronic kidney disease, or unspecified chronic kidney disease: Secondary | ICD-10-CM | POA: Diagnosis not present

## 2022-11-04 DIAGNOSIS — I2510 Atherosclerotic heart disease of native coronary artery without angina pectoris: Secondary | ICD-10-CM | POA: Diagnosis not present

## 2022-11-04 DIAGNOSIS — R6889 Other general symptoms and signs: Secondary | ICD-10-CM | POA: Diagnosis not present

## 2022-11-04 DIAGNOSIS — I499 Cardiac arrhythmia, unspecified: Secondary | ICD-10-CM | POA: Diagnosis not present

## 2022-11-04 DIAGNOSIS — D631 Anemia in chronic kidney disease: Secondary | ICD-10-CM | POA: Diagnosis not present

## 2022-11-04 DIAGNOSIS — Z743 Need for continuous supervision: Secondary | ICD-10-CM | POA: Diagnosis not present

## 2022-11-04 DIAGNOSIS — N186 End stage renal disease: Secondary | ICD-10-CM | POA: Diagnosis not present

## 2022-11-04 DIAGNOSIS — N250 Renal osteodystrophy: Secondary | ICD-10-CM | POA: Diagnosis not present

## 2022-11-04 DIAGNOSIS — I700 Atherosclerosis of aorta: Secondary | ICD-10-CM | POA: Diagnosis not present

## 2022-11-04 DIAGNOSIS — I120 Hypertensive chronic kidney disease with stage 5 chronic kidney disease or end stage renal disease: Secondary | ICD-10-CM | POA: Diagnosis not present

## 2022-11-04 DIAGNOSIS — Z992 Dependence on renal dialysis: Secondary | ICD-10-CM | POA: Diagnosis not present

## 2022-11-04 DIAGNOSIS — M6281 Muscle weakness (generalized): Secondary | ICD-10-CM | POA: Diagnosis not present

## 2022-11-04 DIAGNOSIS — E1122 Type 2 diabetes mellitus with diabetic chronic kidney disease: Secondary | ICD-10-CM | POA: Insufficient documentation

## 2022-11-04 DIAGNOSIS — K8020 Calculus of gallbladder without cholecystitis without obstruction: Secondary | ICD-10-CM | POA: Insufficient documentation

## 2022-11-04 DIAGNOSIS — I10 Essential (primary) hypertension: Secondary | ICD-10-CM | POA: Diagnosis not present

## 2022-11-04 LAB — URINALYSIS, ROUTINE W REFLEX MICROSCOPIC
Bacteria, UA: NONE SEEN
Bilirubin Urine: NEGATIVE
Glucose, UA: NEGATIVE mg/dL
Hgb urine dipstick: NEGATIVE
Ketones, ur: 5 mg/dL — AB
Leukocytes,Ua: NEGATIVE
Nitrite: NEGATIVE
Protein, ur: 100 mg/dL — AB
Specific Gravity, Urine: 1.017 (ref 1.005–1.030)
pH: 5 (ref 5.0–8.0)

## 2022-11-04 LAB — CBC WITH DIFFERENTIAL/PLATELET
Abs Immature Granulocytes: 0.05 10*3/uL (ref 0.00–0.07)
Basophils Absolute: 0.1 10*3/uL (ref 0.0–0.1)
Basophils Relative: 1 %
Eosinophils Absolute: 0.6 10*3/uL — ABNORMAL HIGH (ref 0.0–0.5)
Eosinophils Relative: 5 %
HCT: 28 % — ABNORMAL LOW (ref 39.0–52.0)
Hemoglobin: 8.7 g/dL — ABNORMAL LOW (ref 13.0–17.0)
Immature Granulocytes: 0 %
Lymphocytes Relative: 12 %
Lymphs Abs: 1.4 10*3/uL (ref 0.7–4.0)
MCH: 33.2 pg (ref 26.0–34.0)
MCHC: 31.1 g/dL (ref 30.0–36.0)
MCV: 106.9 fL — ABNORMAL HIGH (ref 80.0–100.0)
Monocytes Absolute: 1.3 10*3/uL — ABNORMAL HIGH (ref 0.1–1.0)
Monocytes Relative: 11 %
Neutro Abs: 8.4 10*3/uL — ABNORMAL HIGH (ref 1.7–7.7)
Neutrophils Relative %: 71 %
Platelets: 246 10*3/uL (ref 150–400)
RBC: 2.62 MIL/uL — ABNORMAL LOW (ref 4.22–5.81)
RDW: 18.6 % — ABNORMAL HIGH (ref 11.5–15.5)
WBC: 11.8 10*3/uL — ABNORMAL HIGH (ref 4.0–10.5)
nRBC: 0 % (ref 0.0–0.2)

## 2022-11-04 LAB — COMPREHENSIVE METABOLIC PANEL
ALT: 13 U/L (ref 0–44)
AST: 23 U/L (ref 15–41)
Albumin: 2.4 g/dL — ABNORMAL LOW (ref 3.5–5.0)
Alkaline Phosphatase: 107 U/L (ref 38–126)
Anion gap: 9 (ref 5–15)
BUN: 23 mg/dL (ref 8–23)
CO2: 26 mmol/L (ref 22–32)
Calcium: 8.5 mg/dL — ABNORMAL LOW (ref 8.9–10.3)
Chloride: 106 mmol/L (ref 98–111)
Creatinine, Ser: 3.63 mg/dL — ABNORMAL HIGH (ref 0.61–1.24)
GFR, Estimated: 17 mL/min — ABNORMAL LOW (ref >60)
Glucose, Bld: 115 mg/dL — ABNORMAL HIGH (ref 70–99)
Potassium: 3.9 mmol/L (ref 3.5–5.1)
Sodium: 141 mmol/L (ref 135–145)
Total Bilirubin: 0.6 mg/dL (ref 0.3–1.2)
Total Protein: 6.4 g/dL — ABNORMAL LOW (ref 6.5–8.1)

## 2022-11-04 LAB — RESP PANEL BY RT-PCR (FLU A&B, COVID) ARPGX2
Influenza A by PCR: NEGATIVE
Influenza B by PCR: NEGATIVE
SARS Coronavirus 2 by RT PCR: NEGATIVE

## 2022-11-04 LAB — CBG MONITORING, ED: Glucose-Capillary: 110 mg/dL — ABNORMAL HIGH (ref 70–99)

## 2022-11-04 LAB — HEPATITIS B SURFACE ANTIGEN: Hepatitis B Surface Ag: NONREACTIVE

## 2022-11-04 LAB — LIPASE, BLOOD: Lipase: 21 U/L (ref 11–51)

## 2022-11-04 LAB — TROPONIN I (HIGH SENSITIVITY): Troponin I (High Sensitivity): 46 ng/L — ABNORMAL HIGH (ref <18)

## 2022-11-04 MED ORDER — CHLORHEXIDINE GLUCONATE CLOTH 2 % EX PADS: 6.0000 | MEDICATED_PAD | Freq: Every day | CUTANEOUS | Status: DC

## 2022-11-04 NOTE — ED Provider Notes (Signed)
[facility] EMERGENCY DEPARTMENT Provider Note   CSN: [id number] Arrival date & time: 11/04/22  2725     History Chief Complaint  Patient presents with   Fall    HPI [name] is a 76 y.o. male presenting for fall at home this morning.  Patient has extensive medical history including Hypertension, CKD, CAD, diabetes, heart failure.  Patient was recently discharged from skilled nursing facility.  He states that when he first left the skilled nursing facility, he was fully ambulatory doing well, however since he is gone home he has again become very weak.  Today his legs gave out from under him while attempting going to the bathroom.  He has no obvious injury or pain.  His major concern is that his weakness has been progressive.   Patient's recorded medical, surgical, social, medication list and allergies were reviewed in the Snapshot window as part of the initial history.   Review of Systems   Review of Systems  Constitutional:  Positive for fatigue. Negative for chills and fever.  HENT:  Negative for ear pain and sore throat.   Eyes:  Negative for pain and visual disturbance.  Respiratory:  Negative for cough and shortness of breath.   Cardiovascular:  Negative for chest pain and palpitations.  Gastrointestinal:  Negative for abdominal pain and vomiting.  Genitourinary:  Negative for dysuria and hematuria.  Musculoskeletal:  Negative for arthralgias and back pain.  Skin:  Negative for color change and rash.  Neurological:  Positive for weakness. Negative for seizures and syncope.  All other systems reviewed and are negative.   Physical Exam Updated Vital Signs BP 129/64   Pulse 81   Temp 98.6 F (37 C) (Oral)   Resp 15   Ht 6' 2"  (1.88 m)   Wt 88.5 kg   SpO2 100%   BMI 25.04 kg/m  Physical Exam Vitals and nursing note reviewed.  Constitutional:      General: He is not in acute distress.    Appearance: He is well-developed.  HENT:     Head: Normocephalic and  atraumatic.  Eyes:     Conjunctiva/sclera: Conjunctivae normal.  Cardiovascular:     Rate and Rhythm: Normal rate and regular rhythm.     Heart sounds: No murmur heard. Pulmonary:     Effort: Pulmonary effort is normal. No respiratory distress.     Breath sounds: Normal breath sounds.  Abdominal:     Palpations: Abdomen is soft.     Tenderness: There is no abdominal tenderness.  Musculoskeletal:        General: No swelling, deformity or signs of injury.     Cervical back: Neck supple.  Skin:    General: Skin is warm and dry.     Capillary Refill: Capillary refill takes less than 2 seconds.  Neurological:     Mental Status: He is alert.  Psychiatric:        Mood and Affect: Mood normal.      ED Course/ Medical Decision Making/ A&P Clinical Course as of 11/04/22 1622  Mon Nov 04, 2022  0917 Cts ok, XR ok [CC]  [place] hospitalist [CC]    Clinical Course User Index [CC] Tretha Sciara, MD    Procedures Procedures   Medications Ordered in ED Medications  Chlorhexidine Gluconate Cloth 2 % PADS 6 each (has no administration in time range)    Medical Decision Making:   CHARLS CUSTER is a 76 y.o. male who presented to the  ED today with a fall at home detailed above. They are not on a blood thinner. Patient's presentation is complicated by their history of complex multifocal medical history.  Patient placed on continuous vitals and telemetry monitoring while in ED which was reviewed periodically.   Complete initial physical exam performed, notably the patient  was hemodynamically stable  in no acute distress. No obvious deformities or injuries appreciated on extensive physical exam including active range of motion of all joints.     Reviewed and confirmed nursing documentation for past medical history, family history, social history.    Initial Assessment/Plan:   This is a patient presenting with a moderate blunt mechanism trauma.  As such, I have considered  intracranial injuries including intracranial hemorrhage, intrathoracic injuries including blunt myocardial or blunt lung injury, blunt abdominal injuries including aortic dissection, bladder injury, spleen injury, liver injury and I have considered orthopedic injuries including extremity or spinal injury. This is most consistent with an acute life/limb threatening illness complicated by underlying chronic conditions.  With the patient's presentation of moderate mechanism trauma but an otherwise reassuring exam, patient warrants targeted evaluation for potential traumatic injuries. Will proceed with targeted evaluation for potential injuries. Will proceed with CT Head, Cervical Spine CT, and Chest/Pelvis XR.   CT ABDOMEN PELVIS WO CONTRAST  Result Date: 11/04/2022 CLINICAL DATA:  Fall this morning, generalized weakness EXAM: CT ABDOMEN AND PELVIS WITHOUT CONTRAST TECHNIQUE: Multidetector CT imaging of the abdomen and pelvis was performed following the standard protocol without IV contrast. RADIATION DOSE REDUCTION: This exam was performed according to the departmental dose-optimization program which includes automated exposure control, adjustment of the mA and/or kV according to patient size and/or use of iterative reconstruction technique. COMPARISON:  CT abdomen/pelvis 09/14/2019 FINDINGS: Lower chest: Reticular opacities in the lung bases likely reflect atelectasis and/or scar. Extensive coronary artery calcifications are again seen. There is no pericardial effusion. Hepatobiliary: Numerous hypodense lesions are again seen throughout the liver likely reflecting benign cysts for which no specific imaging follow-up is required. The previously seen cholecystostomy tube has been removed. Hyperdense material in the gallbladder likely reflects small stones and sludge. There is no significant pericholecystic inflammation or fluid. There is no biliary ductal dilatation. Pancreas: Unremarkable. Spleen: Unremarkable.  Adrenals/Urinary Tract: The adrenals are unremarkable. Hypodense renal lesions are again seen bilaterally likely reflecting benign cysts for which no specific imaging follow-up is required. There are no suspicious lesions, within the confines of noncontrast technique. There are no stones in either kidney or along the course of either ureter. The bladder is unremarkable. Stomach/Bowel: The stomach is unremarkable. There is no evidence of bowel obstruction. There is no abnormal bowel wall thickening or inflammatory change. There is a moderate-to-large stool burden throughout the colon, similar to the prior study. The appendix is not definitively identified. Vascular/Lymphatic: There is extensive calcified plaque throughout the nonaneurysmal abdominal aorta. There is no abdominal or pelvic lymphadenopathy. Reproductive: The prostate and seminal vesicles are unremarkable. Other: There is no ascites or free air. Musculoskeletal: Postsurgical changes reflecting L2 through L5 posterior instrumented fusion are noted. There is no evidence of hardware related complication. A spinal stimulator is noted with the leads protruding for a short distance into thecal sac at the T8-T9 level, unchanged. There is bulky abnormal soft tissue calcification along the dorsum of the left hand. IMPRESSION: 1. No evidence of acute traumatic injury or other acute pathology in the abdomen or pelvis. 2. Interval removal of the previously seen cholecystostomy tube. Hyperdense material in the  gallbladder likely reflects small stones and sludge. No significant pericholecystic inflammation or fluid. 3. Moderate-to-large stool burden throughout the colon. 4. Bulky abnormal soft tissue calcification along the dorsum of the left hand. Consider dedicated hand radiographs. Electronically Signed   By: Valetta Mole M.D.   On: 11/04/2022 15:51   CT HEAD WO CONTRAST (5MM)  Result Date: 11/04/2022 CLINICAL DATA:  Provided history: Polytrauma, blunt. Fall  this morning with generalized weakness. EXAM: CT HEAD WITHOUT CONTRAST CT CERVICAL SPINE WITHOUT CONTRAST TECHNIQUE: Multidetector CT imaging of the head and cervical spine was performed following the standard protocol without intravenous contrast. Multiplanar CT image reconstructions of the cervical spine were also generated. RADIATION DOSE REDUCTION: This exam was performed according to the departmental dose-optimization program which includes automated exposure control, adjustment of the mA and/or kV according to patient size and/or use of iterative reconstruction technique. COMPARISON:  Prior head CT examinations 01/18/2021 and earlier. FINDINGS: CT HEAD FINDINGS Brain: Mild-to-moderate cerebral atrophy. Mild cerebellar atrophy. There is no acute intracranial hemorrhage. No demarcated cortical infarct. No extra-axial fluid collection. No evidence of an intracranial mass. No midline shift. Vascular: No hyperdense vessel. Atherosclerotic calcifications. Skull: No fracture or aggressive osseous lesion. Sinuses/Orbits: No mass or acute finding within the imaged orbits. No significant paranasal sinus disease at the imaged levels. CT CERVICAL SPINE FINDINGS Alignment: No significant spondylolisthesis. Skull base and vertebrae: The basion-dental and atlanto-dental intervals are maintained.No evidence of acute fracture to the cervical spine. Soft tissues and spinal canal: No prevertebral soft tissue swelling or visible canal hematoma. Disc levels: Cervical spondylosis with multilevel disc space narrowing, disc bulges/central disc protrusions, uncovertebral hypertrophy and facet arthrosis. Multilevel spinal canal narrowing. Most notably at C2-C3, a central disc protrusion contributes to at least moderate spinal canal stenosis. Bony neural foraminal narrowing on the left at C3-C4. Upper chest: No consolidation within the imaged lung apices. No visible pneumothorax. IMPRESSION: CT head: 1. No evidence of acute intracranial  abnormality. 2. Mild-to-moderate cerebral atrophy. 3. Mild cerebellar atrophy. CT cervical spine: 1. No evidence of acute fracture to the cervical spine. 2. Cervical spondylosis, as described. Notably at C2-C3, a central disc protrusion contributes to at least moderate spinal canal stenosis. Electronically Signed   By: Kellie Simmering D.O.   On: 11/04/2022 09:00   CT CERVICAL SPINE WO CONTRAST  Result Date: 11/04/2022 CLINICAL DATA:  Provided history: Polytrauma, blunt. Fall this morning with generalized weakness. EXAM: CT HEAD WITHOUT CONTRAST CT CERVICAL SPINE WITHOUT CONTRAST TECHNIQUE: Multidetector CT imaging of the head and cervical spine was performed following the standard protocol without intravenous contrast. Multiplanar CT image reconstructions of the cervical spine were also generated. RADIATION DOSE REDUCTION: This exam was performed according to the departmental dose-optimization program which includes automated exposure control, adjustment of the mA and/or kV according to patient size and/or use of iterative reconstruction technique. COMPARISON:  Prior head CT examinations 01/18/2021 and earlier. FINDINGS: CT HEAD FINDINGS Brain: Mild-to-moderate cerebral atrophy. Mild cerebellar atrophy. There is no acute intracranial hemorrhage. No demarcated cortical infarct. No extra-axial fluid collection. No evidence of an intracranial mass. No midline shift. Vascular: No hyperdense vessel. Atherosclerotic calcifications. Skull: No fracture or aggressive osseous lesion. Sinuses/Orbits: No mass or acute finding within the imaged orbits. No significant paranasal sinus disease at the imaged levels. CT CERVICAL SPINE FINDINGS Alignment: No significant spondylolisthesis. Skull base and vertebrae: The basion-dental and atlanto-dental intervals are maintained.No evidence of acute fracture to the cervical spine. Soft tissues and spinal canal: No prevertebral soft tissue  swelling or visible canal hematoma. Disc levels:  Cervical spondylosis with multilevel disc space narrowing, disc bulges/central disc protrusions, uncovertebral hypertrophy and facet arthrosis. Multilevel spinal canal narrowing. Most notably at C2-C3, a central disc protrusion contributes to at least moderate spinal canal stenosis. Bony neural foraminal narrowing on the left at C3-C4. Upper chest: No consolidation within the imaged lung apices. No visible pneumothorax. IMPRESSION: CT head: 1. No evidence of acute intracranial abnormality. 2. Mild-to-moderate cerebral atrophy. 3. Mild cerebellar atrophy. CT cervical spine: 1. No evidence of acute fracture to the cervical spine. 2. Cervical spondylosis, as described. Notably at C2-C3, a central disc protrusion contributes to at least moderate spinal canal stenosis. Electronically Signed   By: Kellie Simmering D.O.   On: 11/04/2022 09:00   DG Chest Portable 1 View  Result Date: 11/04/2022 CLINICAL DATA:  Fall. EXAM: PORTABLE CHEST 1 VIEW COMPARISON:  September 13, 2022. FINDINGS: Stable cardiomediastinal silhouette. Both lungs are clear. Stable position of right internal jugular dialysis catheter. The visualized skeletal structures are unremarkable. IMPRESSION: No active disease. Electronically Signed   By: Marijo Conception M.D.   On: 11/04/2022 08:16   DG Pelvis Portable  Result Date: 11/04/2022 CLINICAL DATA:  Fall morning.  Pelvic pain. EXAM: PORTABLE PELVIS 1-2 VIEWS COMPARISON:  None Available. FINDINGS: There is no evidence of pelvic fracture or diastasis. No pelvic bone lesions are seen. Fusion hardware noted in the lower lumbar spine. IMPRESSION: No evidence of pelvic fracture. Electronically Signed   By: Marlaine Hind M.D.   On: 11/04/2022 08:15    Additionally, patient is presenting with progressive weakness over the past few weeks.  He was recently admitted in September for cholecystitis.  Treated with cholecystotomy tube.  He did have symptomatic anemia during the admission and multiple medication  changes.  He states that he did well while he was at the facility.  Possible developing polypharmacy is patient's primary concern and is a reasonable differential.  Also considered recurrent infection, viral infection, recurrent cholecystitis.  Will evaluate with screening labs including CBC, CMP.  Patient is planning to have a cholecystotomy next week as his cholecystotomy tube fell out during his fall while he was at the skilled nursing facility.  They left the drain out pending operative management.  Final Reassessment and Plan:   Labs resulted with no acute pathology.  Anemia improving, leukocytosis grossly unchanged from prior.  Observe patient in the emergency department for 3 hours.   Unfortunately, on repeat assessment, patient is unable to ambulate.  Even with two-person assist, patient is on able to put weight on his legs.  At this point he endorses has not gotten out of the chair in 3 days. Initial troponin is at his baseline and he is otherwise in no acute distress at this time.  I do favor that his presentation is likely from his concomitant use of multiple medications that would be part of beers criteria.  In particular, tramadol and gabapentin are likely problematic for him and likely the etiology of his discomfort today as he took the pain dose of each last night prior to coming in.  I consulted [facility] hospitalist [facility]).  They do not feel that hospitalization would benefit the patient at this time.  We discussed findings of leukocytosis, fall at home, need for dialysis, need for further evaluation. They recommended social work consultation. For completion of evaluation, urinalysis, skin exam and CT abdomen pelvis without contrast as well as right upper quadrant ultrasound were all performed  to evaluate for any other potential emergent intra-abdominal or infectious pathologies that would prevent patient from being able to be safely discharged to a skilled nursing facility in a  timely fashion.  Grossly reassuring at this time the right upper quadrant ultrasound still pending.  PT consult placed, social work consult placed and patient placed into emergency department boarder status at this time.   Emergency Department Medication Summary:   Medications  Chlorhexidine Gluconate Cloth 2 % PADS 6 each (has no administration in time range)      Clinical Impression:  1. Fall, initial encounter      Admit   Final Clinical Impression(s) / ED Diagnoses Final diagnoses:  Fall, initial encounter    Rx / DC Orders ED Discharge Orders     None         Tretha Sciara, MD 11/04/22 1622

## 2022-11-04 NOTE — ED Notes (Signed)
Pt is requesting to have an air mattress when he gets admitted upstairs for his bed sores.

## 2022-11-04 NOTE — ED Notes (Signed)
Requested urine specimen but patient states he is oliguric and does not urinate every day.

## 2022-11-04 NOTE — ED Notes (Signed)
CSW updated that pt is now not being admitted. There is a TOC consult for SNF. CSW requested that MD place PT consult. TOC to follow for PT recommendations.

## 2022-11-04 NOTE — ED Triage Notes (Signed)
Pt arrived by [facility] from home for c/o fall and generalized weakness. No LOC. Denies hitting head. CBG 130. Pt has HD MWF.

## 2022-11-04 NOTE — ED Provider Notes (Signed)
Ultrasound without any obvious or significant findings consistent with acute cholecystitis.  Patient does have a little bit of a leukocytosis.  Liver function test are completely normal.  Hospitalist recommending placement and not admission.  Social worker has been consulted.   Fredia Sorrow, MD 11/04/22 [phone]

## 2022-11-04 NOTE — ED Notes (Signed)
Pt unable to stand. MD aware.

## 2022-11-05 ENCOUNTER — Ambulatory Visit: Payer: Medicare Other | Admitting: General Surgery

## 2022-11-05 DIAGNOSIS — I10 Essential (primary) hypertension: Secondary | ICD-10-CM | POA: Diagnosis not present

## 2022-11-05 DIAGNOSIS — R5383 Other fatigue: Secondary | ICD-10-CM | POA: Diagnosis not present

## 2022-11-05 DIAGNOSIS — R531 Weakness: Secondary | ICD-10-CM | POA: Diagnosis not present

## 2022-11-05 LAB — CBC
HCT: 25.5 % — ABNORMAL LOW (ref 39.0–52.0)
Hemoglobin: 7.9 g/dL — ABNORMAL LOW (ref 13.0–17.0)
MCH: 32.6 pg (ref 26.0–34.0)
MCHC: 31 g/dL (ref 30.0–36.0)
MCV: 105.4 fL — ABNORMAL HIGH (ref 80.0–100.0)
Platelets: 293 10*3/uL (ref 150–400)
RBC: 2.42 MIL/uL — ABNORMAL LOW (ref 4.22–5.81)
RDW: 18.4 % — ABNORMAL HIGH (ref 11.5–15.5)
WBC: 9.6 10*3/uL (ref 4.0–10.5)
nRBC: 0 % (ref 0.0–0.2)

## 2022-11-05 LAB — CBG MONITORING, ED: Glucose-Capillary: 86 mg/dL (ref 70–99)

## 2022-11-05 LAB — HEPATITIS B SURFACE ANTIBODY, QUANTITATIVE: Hep B S AB Quant (Post): <3.1 m[IU]/mL — ABNORMAL LOW (ref >9.9)

## 2022-11-05 MED ORDER — ISOSORBIDE MONONITRATE ER 30 MG PO TB24
15.0000 mg | ORAL_TABLET | Freq: Every day | ORAL | Status: DC
  Administered 2022-11-05 – 2022-11-06 (×2): 15 mg via ORAL
  Filled 2022-11-05 (×2): qty 1

## 2022-11-05 MED ORDER — LINAGLIPTIN 5 MG PO TABS
5.0000 mg | ORAL_TABLET | Freq: Every day | ORAL | Status: DC
  Administered 2022-11-05 – 2022-11-06 (×2): 5 mg via ORAL
  Filled 2022-11-05 (×2): qty 1

## 2022-11-05 MED ORDER — ACETAMINOPHEN 500 MG PO TABS
1000.0000 mg | ORAL_TABLET | Freq: Four times a day (QID) | ORAL | Status: DC
  Administered 2022-11-05 – 2022-11-06 (×3): 1000 mg via ORAL
  Filled 2022-11-05 (×3): qty 2

## 2022-11-05 MED ORDER — ASPIRIN 81 MG PO TBEC
81.0000 mg | DELAYED_RELEASE_TABLET | Freq: Every day | ORAL | Status: DC
  Administered 2022-11-05 – 2022-11-06 (×2): 81 mg via ORAL
  Filled 2022-11-05 (×2): qty 1

## 2022-11-05 MED ORDER — PRAVASTATIN SODIUM 40 MG PO TABS
40.0000 mg | ORAL_TABLET | Freq: Every evening | ORAL | Status: DC
  Administered 2022-11-05: 40 mg via ORAL
  Filled 2022-11-05: qty 1

## 2022-11-05 MED ORDER — MELATONIN 5 MG PO TABS: 5.0000 mg | ORAL_TABLET | Freq: Every evening | ORAL | Status: DC | PRN

## 2022-11-05 MED ORDER — HYDROCODONE-ACETAMINOPHEN 5-325 MG PO TABS
2.0000 | ORAL_TABLET | Freq: Once | ORAL | Status: AC
  Administered 2022-11-05: 2 via ORAL
  Filled 2022-11-05: qty 2

## 2022-11-05 MED ORDER — MELATONIN 3 MG PO TABS
6.0000 mg | ORAL_TABLET | Freq: Every evening | ORAL | Status: DC | PRN
  Administered 2022-11-05: 6 mg via ORAL
  Filled 2022-11-05: qty 2

## 2022-11-05 MED ORDER — ANTICOAGULANT SODIUM CITRATE 4% (200MG/5ML) IV SOLN: 5.0000 mL | Status: DC | PRN

## 2022-11-05 MED ORDER — HYDRALAZINE HCL 25 MG PO TABS
25.0000 mg | ORAL_TABLET | Freq: Two times a day (BID) | ORAL | Status: DC
  Administered 2022-11-05: 25 mg via ORAL
  Filled 2022-11-05: qty 1

## 2022-11-05 MED ORDER — OXYCODONE HCL 5 MG PO TABS
5.0000 mg | ORAL_TABLET | ORAL | Status: DC | PRN
  Administered 2022-11-05 (×2): 5 mg via ORAL
  Filled 2022-11-05 (×2): qty 1

## 2022-11-05 MED ORDER — TRAMADOL HCL 50 MG PO TABS: 50.0000 mg | ORAL_TABLET | Freq: Three times a day (TID) | ORAL | Status: DC | PRN

## 2022-11-05 MED ORDER — CARVEDILOL 3.125 MG PO TABS
3.1250 mg | ORAL_TABLET | Freq: Two times a day (BID) | ORAL | Status: DC
  Administered 2022-11-05 – 2022-11-06 (×2): 3.125 mg via ORAL
  Filled 2022-11-05 (×2): qty 1

## 2022-11-05 MED ORDER — CARBOXYMETHYLCELLULOSE SODIUM 0.5 % OP SOLN: 1.0000 [drp] | Freq: Every day | OPHTHALMIC | Status: DC | PRN

## 2022-11-05 MED ORDER — HEPARIN SODIUM (PORCINE) 1000 UNIT/ML DIALYSIS: 1000.0000 [IU] | INTRAMUSCULAR | Status: DC | PRN

## 2022-11-05 MED ORDER — ALTEPLASE 2 MG IJ SOLR
2.0000 mg | Freq: Once | INTRAMUSCULAR | Status: AC | PRN
  Administered 2022-11-05: 2 mg

## 2022-11-05 NOTE — Plan of Care (Signed)
  Problem: Acute Rehab PT Goals(only PT should resolve) Goal: Pt Will Go Supine/Side To Sit Outcome: Progressing Flowsheets (Taken 11/05/2022 1527) Pt will go Supine/Side to Sit:  with minimal assist  with moderate assist Goal: Patient Will Transfer Sit To/From Stand Outcome: Progressing Flowsheets (Taken 11/05/2022 1527) Patient will transfer sit to/from stand:  with minimal assist  with moderate assist Goal: Pt Will Transfer Bed To Chair/Chair To Bed Outcome: Progressing Flowsheets (Taken 11/05/2022 1527) Pt will Transfer Bed to Chair/Chair to Bed:  with min assist  with mod assist Goal: Pt Will Ambulate Outcome: Progressing Flowsheets (Taken 11/05/2022 1527) Pt will Ambulate:  25 feet  with minimal assist  with moderate assist  with rolling walker   3:27 PM, 11/05/22 [name], MPT Physical Therapist with [facility] [phone] office [phone] mobile phone

## 2022-11-05 NOTE — ED Notes (Addendum)
CSW spoke with pt while in HD. Pt states he recently left SNF and returned home. Pt feels he was doing well at home but then got weak again. CSW explained that PT will work with pt and make recommendation. Pt is understandable and agreeable to SNF. CSW to follow up with pt once PT has been able to complete assessment. TOC to follow.   Addendum 2:20: CSW spoke with pt in room to explain that PT has recommended SNF for pt at D/C. He is understanding and agreeable to this. Referral to be sent out. TOC to follow for bed offers.

## 2022-11-05 NOTE — ED Notes (Signed)
Pt returned from dialysis

## 2022-11-05 NOTE — ED Provider Notes (Signed)
Emergency Medicine Observation Re-evaluation Note  [name] is a 76 y.o. male, seen on rounds today.  Pt initially presented to the ED for complaints of Fall Currently, the patient is resting quietly.  Physical Exam  BP (!) 141/59   Pulse 80   Temp 98.6 F (37 C) (Oral)   Resp 18   Ht 6' 2"  (1.88 m)   Wt 88.5 kg   SpO2 96%   BMI 25.04 kg/m  Physical Exam General: No acute distress Cardiac: Well-perfused Lungs: Nonlabored Psych: Cooperative  ED Course / MDM  EKG:EKG Interpretation  Date/Time:  Monday November 04 2022 08:02:33 EST Ventricular Rate:  78 PR Interval:    QRS Duration: 108 QT Interval:  388 QTC Calculation: 442 R Axis:   17 Text Interpretation: Atrial fibrillation Ventricular premature complex Incomplete left bundle branch block Confirmed by Tretha Sciara [phone]) on 11/04/2022 9:17:50 AM  I have reviewed the labs performed to date as well as medications administered while in observation.  Recent changes in the last 24 hours include social work eval.  Plan  Current plan is for PT and social work placement for skilled nursing facility.    Hayden Rasmussen, MD 11/05/22 1736

## 2022-11-05 NOTE — NC FL2 (Signed)
[place] LEVEL OF CARE SCREENING TOOL     IDENTIFICATION  Patient Name: [name] Birthdate: 1946-01-18 Sex: male Admission Date (Current Location): 11/04/2022  [facility] and [place] Number:  [employer] and Address:  [facility] [address] [place]      Provider Number: [id number]  Attending Physician Name and Address:  Default, Provider, MD  Relative Name and Phone Number:       Current Level of Care: Hospital Recommended Level of Care: [place] Prior Approval Number:    Date Approved/Denied:   PASRR Number: [id number]  Discharge Plan: SNF    Current Diagnoses: Patient Active Problem List   Diagnosis Date Noted   Pressure injury of sacral region, stage 1 09/24/2022   Hypotension    Sepsis ([place]) 09/13/2022   Acute cholecystitis 08/19/2022   ESRD (end stage renal disease) ([place]) 08/07/2022   HFrEF (heart failure with reduced ejection fraction) ([place])    (HFpEF) heart failure with preserved ejection fraction ([place]) 07/29/2022   Hypokalemia 07/29/2022   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   Acute respiratory failure with hypoxia ([place])    B12 deficiency 07/18/2022   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/2022   Diabetic neuropathy ([place]) 05/10/2022   Elevated troponin 05/10/2022   Type 2 diabetes mellitus with hyperglycemia ([place]) 05/10/2022   Other intervertebral disc degeneration, lumbar region 02/06/2022   Chronic gout, unspecified, 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[place]i) 02/06/2022   Body mass index (BMI) 35.0-35.9, adult 02/06/2022   Obesity 02/06/2022   Primary generalized (osteo)arthritis 02/06/2022   Chest pain 02/06/2022   Lumbago with sciatica, right side 08/28/2021   Weakness of both lower extremities 08/28/2021   Body mass index (BMI) 33.0-33.9, adult 08/28/2021   Constipation 02/15/2021   Abdominal pain 01/02/2021   Diabetes mellitus ([place]) 11/09/2020   Hypertensive disorder 11/09/2020    Hypercholesterolemia 11/09/2020   H/O adenomatous polyp of colon 05/05/2020   Iron deficiency anemia 05/05/2020   Chronic kidney disease, stage IV (severe) ([place]) 12/14/2019   Acute gouty arthritis 10/25/2019   Leukocytosis 04/02/2019   Cellulitis of right hand 04/01/2019   Osteoarthritis of ankle and foot 02/08/2019   CHF (congestive heart failure) ([place]) 07/05/2015   Acute congestive heart failure ([place]) 07/04/2015   Acute on chronic diastolic congestive heart failure ([place]) 07/04/2015   Mixed hyperlipidemia 07/04/2015   Anxiety state 07/04/2015   GERD (gastroesophageal reflux disease) 07/04/2015   Heart block 07/04/2015   Gastroesophageal reflux disease 35/00/9381   Diastolic CHF, chronic (HCC) 03/11/2015   Chronic diastolic heart failure ([place]) 03/11/2015   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   Knee stiffness 01/13/2014   Gout 12/11/2013   Expected blood loss anemia 11/17/2013   S/P right TKA 11/15/2013   Anemia, normocytic normochromic 01/12/2013   Acute kidney injury superimposed on chronic kidney disease ([place]) 12/10/2012   Diabetes mellitus type 2 in obese ([place]) 12/10/2012   CAD S/P percutaneous coronary angioplasty 11/24/2012   Overweight(278.02) 11/17/2012   Chronic kidney disease, stage 3, cardiorenal syndrome 08/23/2011   Chronic kidney disease, stage 3 unspecified ([place]) 08/23/2011   Essential hypertension     Orientation RESPIRATION BLADDER Height & Weight     Self, Time, Situation, Place  Normal Continent Weight: 192 lb 14.4 oz (87.5 kg) Height:  6' 2"  (188 cm)  BEHAVIORAL SYMPTOMS/MOOD NEUROLOGICAL BOWEL NUTRITION STATUS      Continent Diet (Regular)  AMBULATORY STATUS COMMUNICATION OF NEEDS Skin   Extensive Assist Verbally Normal, Other (Comment) (  Stage 2 on bottom)                       Personal Care Assistance Level of Assistance  Bathing, Feeding, Dressing Bathing Assistance: Maximum assistance Feeding assistance: Independent Dressing Assistance: Maximum  assistance     Functional Limitations Info  Sight, Hearing, Speech Sight Info: Adequate Hearing Info: Adequate Speech Info: Adequate    SPECIAL CARE FACTORS FREQUENCY  PT (By licensed PT), OT (By licensed OT)     PT Frequency: 5 times weekly OT Frequency: 5 times weekly            Contractures Contractures Info: Not present    Additional Factors Info  Code Status, Allergies Code Status Info: FULL Allergies Info: Metformin, niacin, and niacin and related           Current Medications (11/05/2022):  This is the current hospital active medication list Current Facility-Administered Medications  Medication Dose Route Frequency Provider Last Rate Last Admin   anticoagulant sodium citrate solution 5 mL  5 mL Intracatheter PRN Donato Heinz, MD       Chlorhexidine Gluconate Cloth 2 % PADS 6 each  6 each Topical Q0600 Donato Heinz, MD       heparin injection 1,000 Units  1,000 Units Intracatheter PRN Donato Heinz, MD       Current Outpatient Medications  Medication Sig Dispense Refill   acarbose (PRECOSE) 50 MG tablet Take 50 mg by mouth in the morning, at noon, and at bedtime.      acetaminophen (TYLENOL) 500 MG tablet Take 2 tablets (1,000 mg total) by mouth every 6 (six) hours. 30 tablet 0   allopurinol (ZYLOPRIM) 100 MG tablet Take 100 mg by mouth daily.     ascorbic acid (VITAMIN C) 500 MG tablet Take 500 mg by mouth daily.     aspirin EC 81 MG tablet Take 1 tablet (81 mg total) by mouth daily. 90 tablet 3   B Complex-C-Zn-Folic Acid (DIALYVITE 867-JQGB 15) 0.8 MG TABS Take 1 tablet by mouth daily.     calcitRIOL (ROCALTROL) 0.25 MCG capsule Take 0.25 mcg by mouth every Sunday.      carvedilol (COREG) 3.125 MG tablet Take 1 tablet (3.125 mg total) by mouth 2 (two) times daily. 60 tablet 2   docusate sodium (COLACE) 100 MG capsule Take 1 capsule (100 mg total) by mouth 2 (two) times daily. 60 capsule 2   gabapentin (NEURONTIN) 300 MG capsule Take 300 mg by  mouth 4 (four) times daily.     hydrALAZINE (APRESOLINE) 25 MG tablet Take 1 tablet (25 mg total) by mouth in the morning and at bedtime. 60 tablet 2   insulin NPH Human (NOVOLIN N) 100 UNIT/ML injection Inject 0.1 mLs (10 Units total) into the skin every morning. Ands syringes 1/day 10 mL 11   isosorbide mononitrate (IMDUR) 30 MG 24 hr tablet Take 0.5 tablets (15 mg total) by mouth daily. 30 tablet 2   meclizine (ANTIVERT) 12.5 MG tablet Take 1 tablet (12.5 mg total) by mouth 3 (three) times daily as needed for dizziness. 30 tablet 0   melatonin 5 MG TABS Take 5 mg by mouth at bedtime.     Multiple Vitamins-Minerals (MULTIVITAMIN WITH MINERALS) tablet Take 1 tablet by mouth daily.     nitroGLYCERIN (NITROSTAT) 0.4 MG SL tablet Place 1 tablet (0.4 mg total) under the tongue every 5 (five) minutes as needed for chest pain. PLACE ONE TABLET UNDER THE TONGUE EVERY  5 MINUTES FOR 3 DOSES AS NEEDED Strength: 0.4 mg 25 tablet 3   polyethylene glycol (MIRALAX / GLYCOLAX) 17 g packet Take 17 g by mouth daily. 14 each 0   pravastatin (PRAVACHOL) 40 MG tablet Take 1 tablet (40 mg total) by mouth every evening. 90 tablet 3   promethazine (PHENERGAN) 25 MG suppository Place 25 mg rectally every 12 (twelve) hours as needed for nausea or vomiting.     RESTASIS 0.05 % ophthalmic emulsion Place 1 drop into both eyes 2 (two) times daily.     senna (SENOKOT) 8.6 MG TABS tablet Take 2 tablets (17.2 mg total) by mouth 2 (two) times daily. 120 tablet 0   simethicone (MYLICON) 80 MG chewable tablet Chew 80 mg by mouth every 6 (six) hours as needed for flatulence.     sitaGLIPtin (JANUVIA) 25 MG tablet Take 25 mg by mouth daily.     traMADol (ULTRAM) 50 MG tablet Take 50 mg by mouth 4 (four) times daily as needed for pain.     Zinc 50 MG TABS Take 1 tablet by mouth daily.     carboxymethylcellulose (REFRESH PLUS) 0.5 % SOLN Place 1 drop into both eyes daily as needed (dry eyes).     epoetin alfa (EPOGEN) 3000 UNIT/ML  injection Inject into the skin.     ferrous sulfate 325 (65 FE) MG tablet Take 1 tablet (325 mg total) by mouth daily. 30 tablet 3   TRUE METRIX BLOOD GLUCOSE TEST test strip USE TO TEST BLOOD SUGAR UP TO TWICE DAILY OR AS DIRECTED 180 each 3     Discharge Medications: Please see after visit summary for a list of discharge medications.  Relevant Imaging Results:  Relevant Lab Results:   Additional Information SSN: [id number] [place]

## 2022-11-05 NOTE — ED Notes (Addendum)
CSW met with pt at bedside to go over bed offers. Pt accepts bed offer at [facility]. TOC to start insurance auth at this time. CSW attempted to reach Debbie with [facility], unable to get answer at this time. TOC to follow.

## 2022-11-05 NOTE — Procedures (Signed)
I was present at this dialysis session. I have reviewed the session itself and made appropriate changes.  [name] is under observation in the ED awaiting SNF placement.  He was recently discharged from the [facility] center but was too weak to get out of bed and brought to [facility] ED via EMS on 11/04/22.  We were asked to provide HD during his stay.  He normally is on MWF at [facility], however due to high HD census his HD was put off until today.  Volume and electrolytes stable.  Will continue with TTS this week and get back on MWF next week if still here.   Vital signs in last 24 hours:  Temp:  [98 F (36.7 C)-98.6 F (37 C)] 98 F (36.7 C) (11/14 0817) Pulse Rate:  [39-91] 89 (11/14 1000) Resp:  [13-22] 17 (11/14 1000) BP: (103-155)/(45-109) 112/80 (11/14 1000) SpO2:  [96 %-100 %] 100 % (11/14 1000) Weight:  [89.8 kg] 89.8 kg (11/14 0822) Weight change:  Filed Weights   11/04/22 0731 11/05/22 0822  Weight: 88.5 kg 89.8 kg    Recent Labs  Lab 11/04/22 0907  NA 141  K 3.9  CL 106  CO2 26  GLUCOSE 115*  BUN 23  CREATININE 3.63*  CALCIUM 8.5*    Recent Labs  Lab 11/04/22 0907 11/05/22 0658  WBC 11.8* 9.6  NEUTROABS 8.4*  --   HGB 8.7* 7.9*  HCT 28.0* 25.5*  MCV 106.9* 105.4*  PLT 246 293    Scheduled Meds:  Chlorhexidine Gluconate Cloth  6 each Topical Q0600   Continuous Infusions:  anticoagulant sodium citrate     PRN Meds:.alteplase, anticoagulant sodium citrate, heparin   Donetta Potts,  MD 11/05/2022, 10:23 AM

## 2022-11-05 NOTE — ED Notes (Signed)
Pt taken to dialysis 

## 2022-11-05 NOTE — Procedures (Addendum)
Patient alert and oriented, brought to unit in bed by emergency department staff.  Informed consent signed and in chart at emergency department. Patient had difficulty signing due to shaking of hand. Verbalized consent, then signed by myself and another nurse.   Treatment initiated: 0340 Treatment completed: 1222  Patient tolerated well. Transported back to emergency department unit. Patient is alert and not in acute distress.  Hand-off given to patient's nurse, Idelle Crouch, RN.  Access used: Perm HD Cath Access issues: Increased arterial pressures during treatment. Decreased BFR gradually. Treatment was able to continue with 200 BFR. Catheter locked with Activase/cath flo until next treatment Thursday.   Total UF removed: 1.9 L  Post HD VS: Temperature: 98 Blood Pressure: 132/48 (71) Heart Rate: 84  Respiratory Rate: 16 Oxygen Saturation: 100 RA  Post HD weight: 87.5 kg  Morton Peters, RN, BSN [facility]

## 2022-11-05 NOTE — ED Notes (Signed)
Pt turned on right side

## 2022-11-05 NOTE — Evaluation (Signed)
Physical Therapy Evaluation Patient Details Name: [name] MRN: [id number] DOB: 08/18/1946 Today's Date: 11/05/2022  History of Present Illness  DRAGAN TAMBURRINO is a 76 y.o. male presenting for fall at home this morning.  Patient has extensive medical history including Hypertension, CKD, CAD, diabetes, heart failure.  Patient was recently discharged from skilled nursing facility.  He states that when he first left the skilled nursing facility, he was fully ambulatory doing well, however since he is gone home he has again become very weak.  Today his legs gave out from under him while attempting going to the bathroom.  He has no obvious injury or pain.  His major concern is that his weakness has been progressive.   Clinical Impression  Patient demonstrates slow labored movement for sitting up at bedside with c/o severe pain over buttocks with movement, unsteady on feet and limited to a few side steps before having to sit due to c/o fatigue, weakness and left hand frequently sliding off walker.  Patient tolerated sitting up in chair after therapy - nursing staff aware.  Patient will benefit from continued skilled physical therapy in hospital and recommended venue below to increase strength, balance, endurance for safe ADLs and gait.          Recommendations for follow up therapy are one component of a multi-disciplinary discharge planning process, led by the attending physician.  Recommendations may be updated based on patient status, additional functional criteria and insurance authorization.  Follow Up Recommendations Skilled nursing-short term rehab (<3 hours/day) Can patient physically be transported by private vehicle: No    Assistance Recommended at Discharge Intermittent Supervision/Assistance  Patient can return home with the following  A lot of help with walking and/or transfers;A lot of help with bathing/dressing/bathroom;Assistance with cooking/housework;Help with stairs or ramp  for entrance    Equipment Recommendations None recommended by PT  Recommendations for Other Services       Functional Status Assessment Patient has had a recent decline in their functional status and demonstrates the ability to make significant improvements in function in a reasonable and predictable amount of time.     Precautions / Restrictions Precautions Precautions: Fall Restrictions Weight Bearing Restrictions: No      Mobility  Bed Mobility Overal bed mobility: Needs Assistance Bed Mobility: Supine to Sit     Supine to sit: Mod assist     General bed mobility comments: slow labored movement with c/o severe pain over buttocks    Transfers Overall transfer level: Needs assistance Equipment used: Rolling walker (2 wheels) Transfers: Sit to/from Stand, Bed to chair/wheelchair/BSC Sit to Stand: Mod assist   Step pivot transfers: Min assist, Mod assist       General transfer comment: unsteady labored movement with left hand frequently slipping off RW    Ambulation/Gait Ambulation/Gait assistance: Mod assist Gait Distance (Feet): 4 Feet Assistive device: Rolling walker (2 wheels) Gait Pattern/deviations: Decreased step length - left, Decreased step length - right, Decreased stride length Gait velocity: slow     General Gait Details: limited to a few slow labored side steps before having to sit due to c/o fatigue and BLE weakness  Stairs            Wheelchair Mobility    Modified Rankin (Stroke Patients Only)       Balance Overall balance assessment: Needs assistance Sitting-balance support: Feet supported, Bilateral upper extremity supported Sitting balance-Leahy Scale: Poor Sitting balance - Comments: fair/poor seated at EOB mostly due pain over  buttocks   Standing balance support: Reliant on assistive device for balance, Bilateral upper extremity supported Standing balance-Leahy Scale: Poor Standing balance comment: fair/poor using RW                              Pertinent Vitals/Pain Pain Assessment Pain Assessment: Faces Faces Pain Scale: Hurts even more Pain Location: over buttocks with pressure Pain Descriptors / Indicators: Sore, Sharp Pain Intervention(s): Limited activity within patient's tolerance, Monitored during session, Repositioned    Home Living Family/patient expects to be discharged to:: Private residence Living Arrangements: Other relatives Available Help at Discharge: Family;Available PRN/intermittently Type of Home: House Home Access: Stairs to enter Entrance Stairs-Rails: Right;Left;Can reach both Entrance Stairs-Number of Steps: 3   Home Layout: One level Home Equipment: Conservation officer, nature (2 wheels);Cane - single point;Grab bars - tub/shower;BSC/3in1      Prior Function Prior Level of Function : Needs assist       Physical Assist : Mobility (physical) Mobility (physical): Bed mobility;Transfers;Gait;Stairs   Mobility Comments: Household ambulator using RW ADLs Comments: assisted by family     Hand Dominance   Dominant Hand: Right    Extremity/Trunk Assessment   Upper Extremity Assessment Upper Extremity Assessment: Generalized weakness    Lower Extremity Assessment Lower Extremity Assessment: Generalized weakness    Cervical / Trunk Assessment Cervical / Trunk Assessment: Normal  Communication   Communication: No difficulties  Cognition Arousal/Alertness: Awake/alert Behavior During Therapy: WFL for tasks assessed/performed, Anxious Overall Cognitive Status: Within Functional Limits for tasks assessed                                          General Comments      Exercises     Assessment/Plan    PT Assessment Patient needs continued PT services  PT Problem List Decreased strength;Decreased activity tolerance;Decreased balance;Decreased mobility;Pain       PT Treatment Interventions DME instruction;Gait training;Stair  training;Functional mobility training;Therapeutic activities;Therapeutic exercise;Balance training;Patient/family education    PT Goals (Current goals can be found in the Care Plan section)  Acute Rehab PT Goals Patient Stated Goal: return home after rehab PT Goal Formulation: With patient Time For Goal Achievement: 11/19/22 Potential to Achieve Goals: Good    Frequency Min 2X/week     Co-evaluation               AM-PAC PT "6 Clicks" Mobility  Outcome Measure Help needed turning from your back to your side while in a flat bed without using bedrails?: A Lot Help needed moving from lying on your back to sitting on the side of a flat bed without using bedrails?: A Lot Help needed moving to and from a bed to a chair (including a wheelchair)?: A Lot Help needed standing up from a chair using your arms (e.g., wheelchair or bedside chair)?: A Lot Help needed to walk in hospital room?: A Lot Help needed climbing 3-5 steps with a railing? : Total 6 Click Score: 11    End of Session   Activity Tolerance: Patient tolerated treatment well;Patient limited by fatigue;Patient limited by pain Patient left: in chair;with call bell/phone within reach Nurse Communication: Mobility status PT Visit Diagnosis: Unsteadiness on feet (R26.81);Other abnormalities of gait and mobility (R26.89);Muscle weakness (generalized) (M62.81)    Time: 6967-8938 PT Time Calculation (min) (ACUTE ONLY): 34 min   Charges:  PT Evaluation $PT Eval Moderate Complexity: 1 Mod PT Treatments $Therapeutic Activity: 23-37 mins        3:25 PM, 11/05/22 [name], MPT Physical Therapist with [facility] 336 [phone] office [phone] mobile phone

## 2022-11-06 DIAGNOSIS — G4733 Obstructive sleep apnea (adult) (pediatric): Secondary | ICD-10-CM | POA: Diagnosis not present

## 2022-11-06 DIAGNOSIS — R239 Unspecified skin changes: Secondary | ICD-10-CM | POA: Diagnosis not present

## 2022-11-06 DIAGNOSIS — I5020 Unspecified systolic (congestive) heart failure: Secondary | ICD-10-CM | POA: Diagnosis not present

## 2022-11-06 DIAGNOSIS — E11628 Type 2 diabetes mellitus with other skin complications: Secondary | ICD-10-CM | POA: Diagnosis not present

## 2022-11-06 DIAGNOSIS — E1122 Type 2 diabetes mellitus with diabetic chronic kidney disease: Secondary | ICD-10-CM | POA: Diagnosis not present

## 2022-11-06 DIAGNOSIS — N2581 Secondary hyperparathyroidism of renal origin: Secondary | ICD-10-CM | POA: Diagnosis not present

## 2022-11-06 DIAGNOSIS — R52 Pain, unspecified: Secondary | ICD-10-CM | POA: Diagnosis not present

## 2022-11-06 DIAGNOSIS — G894 Chronic pain syndrome: Secondary | ICD-10-CM | POA: Diagnosis not present

## 2022-11-06 DIAGNOSIS — M109 Gout, unspecified: Secondary | ICD-10-CM | POA: Diagnosis not present

## 2022-11-06 DIAGNOSIS — K810 Acute cholecystitis: Secondary | ICD-10-CM | POA: Diagnosis not present

## 2022-11-06 DIAGNOSIS — E1140 Type 2 diabetes mellitus with diabetic neuropathy, unspecified: Secondary | ICD-10-CM | POA: Diagnosis not present

## 2022-11-06 DIAGNOSIS — N189 Chronic kidney disease, unspecified: Secondary | ICD-10-CM | POA: Diagnosis not present

## 2022-11-06 DIAGNOSIS — M47812 Spondylosis without myelopathy or radiculopathy, cervical region: Secondary | ICD-10-CM | POA: Diagnosis not present

## 2022-11-06 DIAGNOSIS — R6889 Other general symptoms and signs: Secondary | ICD-10-CM | POA: Diagnosis not present

## 2022-11-06 DIAGNOSIS — I480 Paroxysmal atrial fibrillation: Secondary | ICD-10-CM | POA: Diagnosis not present

## 2022-11-06 DIAGNOSIS — I5032 Chronic diastolic (congestive) heart failure: Secondary | ICD-10-CM | POA: Diagnosis not present

## 2022-11-06 DIAGNOSIS — M961 Postlaminectomy syndrome, not elsewhere classified: Secondary | ICD-10-CM | POA: Diagnosis not present

## 2022-11-06 DIAGNOSIS — R293 Abnormal posture: Secondary | ICD-10-CM | POA: Diagnosis not present

## 2022-11-06 DIAGNOSIS — M6281 Muscle weakness (generalized): Secondary | ICD-10-CM | POA: Diagnosis not present

## 2022-11-06 DIAGNOSIS — R29898 Other symptoms and signs involving the musculoskeletal system: Secondary | ICD-10-CM | POA: Diagnosis not present

## 2022-11-06 DIAGNOSIS — K219 Gastro-esophageal reflux disease without esophagitis: Secondary | ICD-10-CM | POA: Diagnosis not present

## 2022-11-06 DIAGNOSIS — E1165 Type 2 diabetes mellitus with hyperglycemia: Secondary | ICD-10-CM | POA: Diagnosis not present

## 2022-11-06 DIAGNOSIS — A419 Sepsis, unspecified organism: Secondary | ICD-10-CM | POA: Diagnosis not present

## 2022-11-06 DIAGNOSIS — R531 Weakness: Secondary | ICD-10-CM | POA: Diagnosis not present

## 2022-11-06 DIAGNOSIS — G629 Polyneuropathy, unspecified: Secondary | ICD-10-CM | POA: Diagnosis not present

## 2022-11-06 DIAGNOSIS — R5383 Other fatigue: Secondary | ICD-10-CM | POA: Diagnosis not present

## 2022-11-06 DIAGNOSIS — I130 Hypertensive heart and chronic kidney disease with heart failure and stage 1 through stage 4 chronic kidney disease, or unspecified chronic kidney disease: Secondary | ICD-10-CM | POA: Diagnosis not present

## 2022-11-06 DIAGNOSIS — I120 Hypertensive chronic kidney disease with stage 5 chronic kidney disease or end stage renal disease: Secondary | ICD-10-CM | POA: Diagnosis not present

## 2022-11-06 DIAGNOSIS — D689 Coagulation defect, unspecified: Secondary | ICD-10-CM | POA: Diagnosis not present

## 2022-11-06 DIAGNOSIS — I509 Heart failure, unspecified: Secondary | ICD-10-CM | POA: Diagnosis not present

## 2022-11-06 DIAGNOSIS — M4802 Spinal stenosis, cervical region: Secondary | ICD-10-CM | POA: Diagnosis not present

## 2022-11-06 DIAGNOSIS — R42 Dizziness and giddiness: Secondary | ICD-10-CM | POA: Diagnosis not present

## 2022-11-06 DIAGNOSIS — W19XXXA Unspecified fall, initial encounter: Secondary | ICD-10-CM | POA: Diagnosis not present

## 2022-11-06 DIAGNOSIS — D631 Anemia in chronic kidney disease: Secondary | ICD-10-CM | POA: Diagnosis not present

## 2022-11-06 DIAGNOSIS — M1990 Unspecified osteoarthritis, unspecified site: Secondary | ICD-10-CM | POA: Diagnosis not present

## 2022-11-06 DIAGNOSIS — Z1152 Encounter for screening for COVID-19: Secondary | ICD-10-CM | POA: Diagnosis not present

## 2022-11-06 DIAGNOSIS — F32A Depression, unspecified: Secondary | ICD-10-CM | POA: Diagnosis not present

## 2022-11-06 DIAGNOSIS — M5136 Other intervertebral disc degeneration, lumbar region: Secondary | ICD-10-CM | POA: Diagnosis not present

## 2022-11-06 DIAGNOSIS — N186 End stage renal disease: Secondary | ICD-10-CM | POA: Diagnosis not present

## 2022-11-06 DIAGNOSIS — Z992 Dependence on renal dialysis: Secondary | ICD-10-CM | POA: Diagnosis not present

## 2022-11-06 DIAGNOSIS — E039 Hypothyroidism, unspecified: Secondary | ICD-10-CM | POA: Diagnosis not present

## 2022-11-06 DIAGNOSIS — L89150 Pressure ulcer of sacral region, unstageable: Secondary | ICD-10-CM | POA: Diagnosis not present

## 2022-11-06 DIAGNOSIS — D638 Anemia in other chronic diseases classified elsewhere: Secondary | ICD-10-CM | POA: Diagnosis not present

## 2022-11-06 DIAGNOSIS — E785 Hyperlipidemia, unspecified: Secondary | ICD-10-CM | POA: Diagnosis not present

## 2022-11-06 DIAGNOSIS — N184 Chronic kidney disease, stage 4 (severe): Secondary | ICD-10-CM | POA: Diagnosis not present

## 2022-11-06 DIAGNOSIS — K8020 Calculus of gallbladder without cholecystitis without obstruction: Secondary | ICD-10-CM | POA: Diagnosis not present

## 2022-11-06 DIAGNOSIS — I2510 Atherosclerotic heart disease of native coronary artery without angina pectoris: Secondary | ICD-10-CM | POA: Diagnosis not present

## 2022-11-06 DIAGNOSIS — N250 Renal osteodystrophy: Secondary | ICD-10-CM | POA: Diagnosis not present

## 2022-11-06 DIAGNOSIS — R5381 Other malaise: Secondary | ICD-10-CM | POA: Diagnosis not present

## 2022-11-06 DIAGNOSIS — I10 Essential (primary) hypertension: Secondary | ICD-10-CM | POA: Diagnosis not present

## 2022-11-06 DIAGNOSIS — R2689 Other abnormalities of gait and mobility: Secondary | ICD-10-CM | POA: Diagnosis not present

## 2022-11-06 DIAGNOSIS — Z743 Need for continuous supervision: Secondary | ICD-10-CM | POA: Diagnosis not present

## 2022-11-06 MED ORDER — DARBEPOETIN ALFA 200 MCG/0.4ML IJ SOSY
200.0000 ug | PREFILLED_SYRINGE | INTRAMUSCULAR | Status: DC
  Filled 2022-11-06: qty 0.4

## 2022-11-06 NOTE — Discharge Instructions (Addendum)
It was a pleasure caring for you today in the emergency department. ° °Please return to the emergency department for any worsening or worrisome symptoms. ° ° °

## 2022-11-06 NOTE — ED Notes (Signed)
Lunch tray given to pt. Pt asking for [employer]. Nurse notified.

## 2022-11-06 NOTE — ED Notes (Signed)
CSW updated that insurance has been approved at this time. CSW udpated Debbie with [facility] and she is ready to accept pt. Room and report numbers have been provided to RN. TOC signing off.

## 2022-11-06 NOTE — Progress Notes (Signed)
Patient ID: [name], male   DOB: [birth year], 76 y.o.   MRN: [id number] HPI:  [name] is a 76 y.o. male with a PMH significant for CAD, h/o STEMI, DM type 2, HTN, HLD, GERD, vertigo, ESRD on HD MWF at [facility], and recent cholecystitis s/p cholecystostomy tube placement and antibiotics (poor surgical candidate) who presented to [facility] ED on 11/04/22 after falling at home and unable to walk.  He was recently discharged from the [facility] to home, however he remains too weak to remain at home.  He is under observation in the ED until new SNF can be found.  We were asked to provide dialysis during his stay.  Feeling well, no new complaints.  Anxious to get out of hospital. O:BP (!) 131/53   Pulse 81   Temp 98.8 F (37.1 C)   Resp 17   Ht 6' 2"  (1.88 m)   Wt 87.5 kg   SpO2 98%   BMI 24.77 kg/m   Intake/Output Summary (Last 24 hours) at 11/06/2022 1217 Last data filed at 11/05/2022 1222 Gross per 24 hour  Intake --  Output 1.9 ml  Net -1.9 ml   Intake/Output: I/O last 3 completed shifts: In: -  Out: 1.9 [Other:1.9]  Intake/Output this shift:  No intake/output data recorded. Weight change: 1.361 kg Gen: NAD CVS: RRR Resp: CTA Abd: +BS, soft, NT/ND Ext: brawny edema BLE  Recent Labs  Lab 11/04/22 0907  NA 141  K 3.9  CL 106  CO2 26  GLUCOSE 115*  BUN 23  CREATININE 3.63*  ALBUMIN 2.4*  CALCIUM 8.5*  AST 23  ALT 13   Liver Function Tests: Recent Labs  Lab 11/04/22 0907  AST 23  ALT 13  ALKPHOS 107  BILITOT 0.6  PROT 6.4*  ALBUMIN 2.4*   Recent Labs  Lab 11/04/22 0907  LIPASE 21   No results for input(s): "AMMONIA" in the last 168 hours. CBC: Recent Labs  Lab 11/04/22 0907 11/05/22 0658  WBC 11.8* 9.6  NEUTROABS 8.4*  --   HGB 8.7* 7.9*  HCT 28.0* 25.5*  MCV 106.9* 105.4*  PLT 246 293   Cardiac Enzymes: No results for input(s): "CKTOTAL", "CKMB", "CKMBINDEX", "TROPONINI" in the last 168 hours. CBG: Recent Labs  Lab 11/04/22 1443  11/05/22 1610  GLUCAP 110* 86    Iron Studies: No results for input(s): "IRON", "TIBC", "TRANSFERRIN", "FERRITIN" in the last 72 hours. Studies/Results: US Abdomen Limited RUQ (LIVER/GB)  Result Date: 11/04/2022 CLINICAL DATA:  Right upper quadrant pain EXAM: ULTRASOUND ABDOMEN LIMITED RIGHT UPPER QUADRANT COMPARISON:  None Available. FINDINGS: Gallbladder: Wall echo shadow sign suggesting multiple gallstones. Gallbladder wall thickening measuring up to 7 mm. No sonographic Murphy sign noted by sonographer. Common bile duct: Diameter: 5 mm.  No intrahepatic biliary ductal dilatation. Liver: No focal lesion identified. Within normal limits in parenchymal echogenicity. Portal vein is patent on color Doppler imaging with normal direction of blood flow towards the liver. Other: None. IMPRESSION: Cholelithiasis with gallbladder wall thickening. Sonographic Murphy's sign was not elicited. No biliary ductal dilatation. Clinical correlation is suggested. Electronically Signed   By: Keane Police D.O.   On: 11/04/2022 16:34   CT ABDOMEN PELVIS WO CONTRAST  Result Date: 11/04/2022 CLINICAL DATA:  Fall this morning, generalized weakness EXAM: CT ABDOMEN AND PELVIS WITHOUT CONTRAST TECHNIQUE: Multidetector CT imaging of the abdomen and pelvis was performed following the standard protocol without IV contrast. RADIATION DOSE REDUCTION: This exam was performed according to the departmental  dose-optimization program which includes automated exposure control, adjustment of the mA and/or kV according to patient size and/or use of iterative reconstruction technique. COMPARISON:  CT abdomen/pelvis 09/14/2019 FINDINGS: Lower chest: Reticular opacities in the lung bases likely reflect atelectasis and/or scar. Extensive coronary artery calcifications are again seen. There is no pericardial effusion. Hepatobiliary: Numerous hypodense lesions are again seen throughout the liver likely reflecting benign cysts for which no  specific imaging follow-up is required. The previously seen cholecystostomy tube has been removed. Hyperdense material in the gallbladder likely reflects small stones and sludge. There is no significant pericholecystic inflammation or fluid. There is no biliary ductal dilatation. Pancreas: Unremarkable. Spleen: Unremarkable. Adrenals/Urinary Tract: The adrenals are unremarkable. Hypodense renal lesions are again seen bilaterally likely reflecting benign cysts for which no specific imaging follow-up is required. There are no suspicious lesions, within the confines of noncontrast technique. There are no stones in either kidney or along the course of either ureter. The bladder is unremarkable. Stomach/Bowel: The stomach is unremarkable. There is no evidence of bowel obstruction. There is no abnormal bowel wall thickening or inflammatory change. There is a moderate-to-large stool burden throughout the colon, similar to the prior study. The appendix is not definitively identified. Vascular/Lymphatic: There is extensive calcified plaque throughout the nonaneurysmal abdominal aorta. There is no abdominal or pelvic lymphadenopathy. Reproductive: The prostate and seminal vesicles are unremarkable. Other: There is no ascites or free air. Musculoskeletal: Postsurgical changes reflecting L2 through L5 posterior instrumented fusion are noted. There is no evidence of hardware related complication. A spinal stimulator is noted with the leads protruding for a short distance into thecal sac at the T8-T9 level, unchanged. There is bulky abnormal soft tissue calcification along the dorsum of the left hand. IMPRESSION: 1. No evidence of acute traumatic injury or other acute pathology in the abdomen or pelvis. 2. Interval removal of the previously seen cholecystostomy tube. Hyperdense material in the gallbladder likely reflects small stones and sludge. No significant pericholecystic inflammation or fluid. 3. Moderate-to-large stool  burden throughout the colon. 4. Bulky abnormal soft tissue calcification along the dorsum of the left hand. Consider dedicated hand radiographs. Electronically Signed   By: Valetta Mole M.D.   On: 11/04/2022 15:51    acetaminophen  1,000 mg Oral Q6H   aspirin EC  81 mg Oral Daily   carvedilol  3.125 mg Oral BID   Chlorhexidine Gluconate Cloth  6 each Topical Q0600   hydrALAZINE  25 mg Oral BID   isosorbide mononitrate  15 mg Oral Daily   linagliptin  5 mg Oral Daily   pravastatin  40 mg Oral QPM    BMET    Component Value Date/Time   NA 141 11/04/2022 0907   NA 145 (H) 01/02/2021 1548   K 3.9 11/04/2022 0907   CL 106 11/04/2022 0907   CO2 26 11/04/2022 0907   GLUCOSE 115 (H) 11/04/2022 0907   BUN 23 11/04/2022 0907   BUN 35 (H) 01/02/2021 1548   CREATININE 3.63 (H) 11/04/2022 0907   CREATININE 2.49 (H) 09/18/2015 1359   CALCIUM 8.5 (L) 11/04/2022 0907   GFRNONAA 17 (L) 11/04/2022 0907   GFRAA 40 (L) 01/02/2021 1548   CBC    Component Value Date/Time   WBC 9.6 11/05/2022 0658   RBC 2.42 (L) 11/05/2022 0658   HGB 7.9 (L) 11/05/2022 0658   HGB 12.8 (L) 12/14/2020 1242   HCT 25.5 (L) 11/05/2022 0658   HCT 37.2 (L) 12/14/2020 1242   PLT 293  11/05/2022 0658   PLT 193 12/14/2020 1242   MCV 105.4 (H) 11/05/2022 0658   MCV 95 12/14/2020 1242   MCH 32.6 11/05/2022 0658   MCHC 31.0 11/05/2022 0658   RDW 18.4 (H) 11/05/2022 0658   RDW 14.4 12/14/2020 1242   LYMPHSABS 1.4 11/04/2022 0907   LYMPHSABS 1.6 12/14/2020 1242   MONOABS 1.3 (H) 11/04/2022 0907   EOSABS 0.6 (H) 11/04/2022 0907   EOSABS 0.1 12/14/2020 1242   BASOSABS 0.1 11/04/2022 0907   BASOSABS 0.0 12/14/2020 1242   Dialysis Orders( as of last month) : Center: [facility]  on MWF . EDW 89 kg HD Bath 2K/2.5Ca  Time 4 hours Heparin none. Access RIJ TDC BFR 400 DFR 800   Micera 225 mcg IVP every 2 weeks    Assessment/Plan:  ESRD - normally on MWF but off schedule.  Had HD yesterday and plan again tomorrow.  Will get back  on MWF schedule after discharge from ED.  Anemia of ESRD - will continue with ESA and follow. HTN/volume -stable CKD-MBD - continue with home meds Disposition - awaiting insurance authorization from [place].  They will also need to transport him to and from HD.   Donetta Potts, MD [facility]

## 2022-11-06 NOTE — ED Notes (Signed)
Patient had [facility] with new brief applied to patient with perineal care. New gown provided.

## 2022-11-06 NOTE — Care Management Important Message (Signed)
Important Message  Patient Details  Name: [name] MRN: [id number] Date of Birth: 11-Feb-1946   Medicare Important Message Given:  N/A - LOS <3 / Initial given by admissions     Tommy Medal 11/06/2022, 1:44 PM

## 2022-11-06 NOTE — ED Provider Notes (Signed)
Emergency Medicine Observation Re-evaluation Note  [name] is a 76 y.o. male, seen on rounds today.  Pt initially presented to the ED for complaints of Fall Currently, the patient is resting.  Physical Exam  BP (!) 131/53   Pulse 81   Temp 98.8 F (37.1 C)   Resp 17   Ht 6' 2"  (1.88 m)   Wt 87.5 kg   SpO2 98%   BMI 24.77 kg/m  Physical Exam General: nad   ED Course / MDM  EKG:EKG Interpretation  Date/Time:  Monday November 04 2022 08:02:33 EST Ventricular Rate:  78 PR Interval:    QRS Duration: 108 QT Interval:  388 QTC Calculation: 442 R Axis:   17 Text Interpretation: Atrial fibrillation Ventricular premature complex Incomplete left bundle branch block Confirmed by Tretha Sciara [phone]) on 11/04/2022 9:17:50 AM  I have reviewed the labs performed to date as well as medications administered while in observation.  Recent changes in the last 24 hours include placement.  Plan  Current plan is send to [facility], has bed avail.    Jeanell Sparrow, DO 11/06/22 1527

## 2022-11-06 NOTE — ED Notes (Signed)
CSW updated Debbie with [facility] that pt has accepted SNF bed at their facility. CSW updated that insurance Josem Kaufmann is still pending at this time. TOC to follow with updates.

## 2022-11-06 NOTE — ED Notes (Signed)
Patient verbalizes understanding that he has a bed assigned at [facility] and is agreeable to the plan of care for discharge and voices no complaints at this time. Patient also know that transport will be provided by [facility] due to patient being non-ambulatory and patient also verbalizes understanding of this.

## 2022-11-08 DIAGNOSIS — N2581 Secondary hyperparathyroidism of renal origin: Secondary | ICD-10-CM | POA: Diagnosis not present

## 2022-11-08 DIAGNOSIS — N186 End stage renal disease: Secondary | ICD-10-CM | POA: Diagnosis not present

## 2022-11-08 DIAGNOSIS — E1122 Type 2 diabetes mellitus with diabetic chronic kidney disease: Secondary | ICD-10-CM | POA: Diagnosis not present

## 2022-11-08 DIAGNOSIS — E039 Hypothyroidism, unspecified: Secondary | ICD-10-CM | POA: Diagnosis not present

## 2022-11-08 DIAGNOSIS — D689 Coagulation defect, unspecified: Secondary | ICD-10-CM | POA: Diagnosis not present

## 2022-11-08 DIAGNOSIS — Z992 Dependence on renal dialysis: Secondary | ICD-10-CM | POA: Diagnosis not present

## 2022-11-08 DIAGNOSIS — D631 Anemia in chronic kidney disease: Secondary | ICD-10-CM | POA: Diagnosis not present

## 2022-11-10 DIAGNOSIS — E039 Hypothyroidism, unspecified: Secondary | ICD-10-CM | POA: Diagnosis not present

## 2022-11-10 DIAGNOSIS — N2581 Secondary hyperparathyroidism of renal origin: Secondary | ICD-10-CM | POA: Diagnosis not present

## 2022-11-10 DIAGNOSIS — N186 End stage renal disease: Secondary | ICD-10-CM | POA: Diagnosis not present

## 2022-11-10 DIAGNOSIS — Z992 Dependence on renal dialysis: Secondary | ICD-10-CM | POA: Diagnosis not present

## 2022-11-10 DIAGNOSIS — E1122 Type 2 diabetes mellitus with diabetic chronic kidney disease: Secondary | ICD-10-CM | POA: Diagnosis not present

## 2022-11-10 DIAGNOSIS — D689 Coagulation defect, unspecified: Secondary | ICD-10-CM | POA: Diagnosis not present

## 2022-11-10 DIAGNOSIS — D631 Anemia in chronic kidney disease: Secondary | ICD-10-CM | POA: Diagnosis not present

## 2022-11-11 DIAGNOSIS — I10 Essential (primary) hypertension: Secondary | ICD-10-CM | POA: Diagnosis not present

## 2022-11-11 DIAGNOSIS — I480 Paroxysmal atrial fibrillation: Secondary | ICD-10-CM | POA: Diagnosis not present

## 2022-11-11 DIAGNOSIS — G894 Chronic pain syndrome: Secondary | ICD-10-CM | POA: Diagnosis not present

## 2022-11-11 DIAGNOSIS — E1122 Type 2 diabetes mellitus with diabetic chronic kidney disease: Secondary | ICD-10-CM | POA: Diagnosis not present

## 2022-11-11 DIAGNOSIS — I2510 Atherosclerotic heart disease of native coronary artery without angina pectoris: Secondary | ICD-10-CM | POA: Diagnosis not present

## 2022-11-11 DIAGNOSIS — D638 Anemia in other chronic diseases classified elsewhere: Secondary | ICD-10-CM | POA: Diagnosis not present

## 2022-11-11 DIAGNOSIS — M6281 Muscle weakness (generalized): Secondary | ICD-10-CM | POA: Diagnosis not present

## 2022-11-11 DIAGNOSIS — N186 End stage renal disease: Secondary | ICD-10-CM | POA: Diagnosis not present

## 2022-11-12 ENCOUNTER — Ambulatory Visit (INDEPENDENT_AMBULATORY_CARE_PROVIDER_SITE_OTHER): Payer: Medicare Other | Admitting: General Surgery

## 2022-11-12 VITALS — BP 128/70 | HR 74 | Temp 98.0°F | Resp 14 | Ht 74.0 in | Wt 192.0 lb

## 2022-11-12 DIAGNOSIS — E1122 Type 2 diabetes mellitus with diabetic chronic kidney disease: Secondary | ICD-10-CM | POA: Diagnosis not present

## 2022-11-12 DIAGNOSIS — M6281 Muscle weakness (generalized): Secondary | ICD-10-CM | POA: Diagnosis not present

## 2022-11-12 DIAGNOSIS — L89150 Pressure ulcer of sacral region, unstageable: Secondary | ICD-10-CM | POA: Diagnosis not present

## 2022-11-12 DIAGNOSIS — K810 Acute cholecystitis: Secondary | ICD-10-CM

## 2022-11-12 DIAGNOSIS — I10 Essential (primary) hypertension: Secondary | ICD-10-CM | POA: Diagnosis not present

## 2022-11-12 DIAGNOSIS — I2510 Atherosclerotic heart disease of native coronary artery without angina pectoris: Secondary | ICD-10-CM | POA: Diagnosis not present

## 2022-11-12 DIAGNOSIS — R239 Unspecified skin changes: Secondary | ICD-10-CM | POA: Diagnosis not present

## 2022-11-12 DIAGNOSIS — G629 Polyneuropathy, unspecified: Secondary | ICD-10-CM | POA: Diagnosis not present

## 2022-11-12 DIAGNOSIS — E11628 Type 2 diabetes mellitus with other skin complications: Secondary | ICD-10-CM | POA: Diagnosis not present

## 2022-11-12 DIAGNOSIS — R42 Dizziness and giddiness: Secondary | ICD-10-CM | POA: Diagnosis not present

## 2022-11-12 DIAGNOSIS — M109 Gout, unspecified: Secondary | ICD-10-CM | POA: Diagnosis not present

## 2022-11-12 DIAGNOSIS — R5381 Other malaise: Secondary | ICD-10-CM | POA: Diagnosis not present

## 2022-11-12 NOTE — Patient Instructions (Signed)
Will get Anesthesia to look at your chart and decide if you are a candidate for surgery at [facility].   Minimally Invasive Cholecystectomy Minimally invasive cholecystectomy is surgery to remove the gallbladder. The gallbladder is a pear-shaped organ that lies beneath the liver on the right side of the body. The gallbladder stores bile, which is a fluid that helps the body digest fats. Cholecystectomy is often done to treat inflammation (irritation and swelling) of the gallbladder (cholecystitis). This condition is usually caused by a buildup of gallstones (cholelithiasis) in the gallbladder or when the fluid in the gall bladder becomes stagnant because gallstones get stuck in the ducts (tubes) and block the flow of bile. This can result in inflammation and pain. In severe cases, emergency surgery may be required. This procedure is done through small incisions in the abdomen, instead of one large incision. It is also called laparoscopic surgery. A thin scope with a camera (laparoscope) is inserted through one incision. Then surgical instruments are inserted through the other incisions. In some cases, a minimally invasive surgery may need to be changed to a surgery that is done through a larger incision. This is called open surgery. Tell a health care provider about: Any allergies you have. All medicines you are taking, including vitamins, herbs, eye drops, creams, and over-the-counter medicines. Any problems you or family members have had with anesthetic medicines. Any bleeding problems you have. Any surgeries you have had. Any medical conditions you have. Whether you are pregnant or may be pregnant. What are the risks? Generally, this is a safe procedure. However, problems may occur, including: Infection. Bleeding. Allergic reactions to medicines. Damage to nearby structures or organs. A gallstone remaining in the common bile duct. The common bile duct carries bile from the gallbladder to the  small intestine. A bile leak from the liver or cystic duct after your gallbladder is removed. What happens before the procedure? Medicines Ask your health care provider about: Changing or stopping your regular medicines. This is especially important if you are taking diabetes medicines or blood thinners. Taking medicines such as aspirin and ibuprofen. These medicines can thin your blood. Do not take these medicines unless your health care provider tells you to take them. Taking over-the-counter medicines, vitamins, herbs, and supplements. General instructions If you will be going home right after the procedure, plan to have a responsible adult: Take you home from the hospital or clinic. You will not be allowed to drive. Care for you for the time you are told. Do not use any products that contain nicotine or tobacco for at least 4 weeks before the procedure. These products include cigarettes, chewing tobacco, and vaping devices, such as e-cigarettes. If you need help quitting, ask your health care provider. Ask your health care provider: How your surgery site will be marked. What steps will be taken to help prevent infection. These may include: Removing hair at the surgery site. Washing skin with a germ-killing soap. Taking antibiotic medicine. What happens during the procedure?  An IV will be inserted into one of your veins. You will be given one or both of the following: A medicine to help you relax (sedative). A medicine to make you fall asleep (general anesthetic). Your surgeon will make several small incisions in your abdomen. The laparoscope will be inserted through one of the small incisions. The camera on the laparoscope will send images to a monitor in the operating room. This lets your surgeon see inside your abdomen. A gas will be  pumped into your abdomen. This will expand your abdomen to give the surgeon more room to perform the surgery. Other tools that are needed for the  procedure will be inserted through the other incisions. The gallbladder will be removed through one of the incisions. Your common bile duct may be examined. If stones are found in the common bile duct, they may be removed. After your gallbladder has been removed, the incisions will be closed with stitches (sutures), staples, or skin glue. Your incisions will be covered with a bandage (dressing). The procedure may vary among health care providers and hospitals. What happens after the procedure? Your blood pressure, heart rate, breathing rate, and blood oxygen level will be monitored until you leave the hospital or clinic. You will be given medicines as needed to control your pain. You may have a drain placed in the incision. The drain will be removed a day or two after the procedure. Summary Minimally invasive cholecystectomy, also called laparoscopic cholecystectomy, is surgery to remove the gallbladder using small incisions. Tell your health care provider about all the medical conditions you have and all the medicines you are taking for those conditions. Before the procedure, follow instructions about when to stop eating and drinking and changing or stopping medicines. Plan to have a responsible adult care for you for the time you are told after you leave the hospital or clinic. This information is not intended to replace advice given to you by your health care provider. Make sure you discuss any questions you have with your health care provider. Document Revised: 06/12/2021 Document Reviewed: 06/12/2021 Elsevier Patient Education  [place].

## 2022-11-12 NOTE — Progress Notes (Signed)
[facility] History and Physical   Chief Complaint   Follow-up     [name] is a 76 y.o. male.  HPI: [name] is a known to me after IR placed cholecystostomy tube after acute cholecystitis after he had worsening CHF and new onset dialysis in August. He has been in and out of SNF and has been losing weight.   He has nausea and upper abdominal pain. He says he has no appetite and does not want to eat.  His last IR drain study demonstrated a patent cystic duct. Unfortunately, his IR drain was accidentally dislodged when he fell. He is now back at a facility and feels like he is getting weaker.   He is here today with his brother. Cardiology has reviewed his case and feel like he is an appropriate candidate for cholecystectomy.   He does dialysis on MWF at a facility in [place] he reports.   Past Medical History:  Diagnosis Date   Anginal pain ([place])    Anxiety    Benign prostatic hypertrophy    Nocturia   CAD (coronary artery disease)    a.  NSTEMI 10/2012 s/p DES to LAD & DES to 1st diagonal with residual diffuse nonobstructive dz in LCx/RCA    Chronic kidney disease, stage 3, mod decreased GFR (HCC)    Creatinine of 1.5 in 03/2009, STAGE 4 now   Degenerative joint disease    Depression    hx of   Diabetes mellitus, type II ([place]) 97/98/9211   Diastolic heart failure    Pedal edema, "a long time ago"   Erectile dysfunction    Flu 2013   hx of   GERD (gastroesophageal reflux disease)    Gout    Hemorrhoids    High cholesterol    Hypertension    Exercise induced   Myocardial infarction ([place])    Neuropathy    bilateral legs   NSVT (nonsustained ventricular tachycardia) ([place])    Exercise induced   Obesity    Tobacco abuse, 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, quit 1994   Tubular adenoma 2014   Vertigo    everyday    Past Surgical History:  Procedure Laterality Date   BACK SURGERY  2007   x2   BIOPSY  02/05/2021   Procedure: BIOPSY;   Surgeon: Daneil Dolin, MD;  Location: [facility];  Service: Endoscopy;;   CARDIAC CATHETERIZATION     with stent placement   COLONOSCOPY WITH ESOPHAGOGASTRODUODENOSCOPY (EGD) N/A 11/23/2013   Dr. Oneida Alar- TCS= left sided colitis and mild proctitis likely due to ischemic colitis, moderate internal hemorrhoids, small nodule in the rectum, bx=tubular adenoma, EGD=-mild non-erosive gastritis   COLONOSCOPY WITH PROPOFOL N/A 07/24/2020   hemorrhoids,  rectosigmoid lipoma, small adenoma removed.   CORONARY ANGIOPLASTY     ESOPHAGOGASTRODUODENOSCOPY (EGD) WITH PROPOFOL N/A 02/05/2021   Normal esophagus, erythematous mucosa in entire stomach, no obvious ulcer, s/p biopsy. Normal duodenum. Negative H.pylori.    HERNIA REPAIR     INSERTION OF DIALYSIS CATHETER N/A 08/06/2022   Procedure: INSERTION OF DIALYSIS CATHETER;  Surgeon: Rosetta Posner, MD;  Location: [facility];  Service: Vascular;  Laterality: N/A;   IR EXCHANGE BILIARY DRAIN  10/17/2022   IR PERC CHOLECYSTOSTOMY  08/20/2022   JOINT REPLACEMENT     LEFT HEART CATHETERIZATION WITH CORONARY ANGIOGRAM N/A 11/16/2012   Procedure: LEFT HEART CATHETERIZATION WITH CORONARY ANGIOGRAM;  Surgeon: Sherren Mocha, MD;  Location: [facility] CATH LAB;  Service: Cardiovascular;  Laterality: N/A;   PERCUTANEOUS CORONARY STENT INTERVENTION (PCI-S)  11/16/2012   Procedure: PERCUTANEOUS CORONARY STENT INTERVENTION (PCI-S);  Surgeon: Sherren Mocha, MD;  Location: [facility] CATH LAB;  Service: Cardiovascular;;   POLYPECTOMY  07/24/2020   Procedure: POLYPECTOMY;  Surgeon: Daneil Dolin, MD;  Location: [facility];  Service: Endoscopy;;   SPINAL CORD STIMULATOR INSERTION N/A 06/10/2022   Procedure: Spinal cord stimulator placement;  Surgeon: Newman Pies, MD;  Location: [place];  Service: Neurosurgery;  Laterality: N/A;  RM 19/ to follow   THUMB AMPUTATION Left    50 years ago   TOTAL KNEE ARTHROPLASTY Right 11/15/2013   Procedure: RIGHT TOTAL KNEE ARTHROPLASTY;  Surgeon:  Mauri Pole, MD;  Location: [facility];  Service: Orthopedics;  Laterality: Right;   TOTAL KNEE ARTHROPLASTY Left 03/07/2015   Procedure: LEFT TOTAL KNEE ARTHROPLASTY;  Surgeon: Paralee Cancel, MD;  Location: [facility];  Service: Orthopedics;  Laterality: Left;    Family History  Problem Relation Age of Onset   Hypertension Other    Cancer Other    Heart disease Other    Diabetes Other    Hypertension Mother    Cancer Father        brain   CAD Brother    Colon cancer Neg Hx     Social History   Tobacco Use   Smoking status: Former    Packs/day: 1.00    Years: 30.00    Total pack years: 30.00    Types: Cigarettes    Start date: 01/22/1965    Quit date: 12/23/1992    Years since quitting: 29.9   Smokeless tobacco: Never  Vaping Use   Vaping Use: Never used  Substance Use Topics   Alcohol use: No    Alcohol/week: 0.0 standard drinks of alcohol   Drug use: No    Medications: I have reviewed the patient's current medications. Allergies as of 11/12/2022       Reactions   Metformin Nausea And Vomiting   Niacin    Niacin And Related Other (See Comments)   Reaction: flushing and burning side effects even with extended release        Medication List        Accurate as of November 12, 2022  3:28 PM. If you have any questions, ask your nurse or doctor.          acarbose 50 MG tablet Commonly known as: PRECOSE Take 50 mg by mouth in the morning, at noon, and at bedtime.   acetaminophen 500 MG tablet Commonly known as: TYLENOL Take 2 tablets (1,000 mg total) by mouth every 6 (six) hours.   allopurinol 100 MG tablet Commonly known as: ZYLOPRIM Take 100 mg by mouth daily.   ascorbic acid 500 MG tablet Commonly known as: VITAMIN C Take 500 mg by mouth daily.   aspirin EC 81 MG tablet Take 1 tablet (81 mg total) by mouth daily.   calcitRIOL 0.25 MCG capsule Commonly known as: ROCALTROL Take 0.25 mcg by mouth every Sunday.   carboxymethylcellulose 0.5 %  Soln Commonly known as: REFRESH PLUS Place 1 drop into both eyes daily as needed (dry eyes).   carvedilol 3.125 MG tablet Commonly known as: COREG Take 1 tablet (3.125 mg total) by mouth 2 (two) times daily.   Dialyvite 800-Zinc 15 0.8 MG Tabs Take 1 tablet by mouth daily.   docusate sodium 100 MG capsule Commonly known as: Colace Take 1 capsule (100 mg total) by  mouth 2 (two) times daily.   epoetin alfa 3000 UNIT/ML injection Commonly known as: EPOGEN Inject into the skin.   ferrous sulfate 325 (65 FE) MG tablet Take 1 tablet (325 mg total) by mouth daily.   gabapentin 300 MG capsule Commonly known as: NEURONTIN Take 300 mg by mouth 4 (four) times daily.   hydrALAZINE 25 MG tablet Commonly known as: APRESOLINE Take 1 tablet (25 mg total) by mouth in the morning and at bedtime.   insulin NPH Human 100 UNIT/ML injection Commonly known as: NovoLIN N Inject 0.1 mLs (10 Units total) into the skin every morning. Ands syringes 1/day   isosorbide mononitrate 30 MG 24 hr tablet Commonly known as: IMDUR Take 0.5 tablets (15 mg total) by mouth daily.   Januvia 25 MG tablet Generic drug: sitaGLIPtin Take 25 mg by mouth daily.   meclizine 12.5 MG tablet Commonly known as: ANTIVERT Take 1 tablet (12.5 mg total) by mouth 3 (three) times daily as needed for dizziness.   melatonin 5 MG Tabs Take 5 mg by mouth at bedtime.   multivitamin with minerals tablet Take 1 tablet by mouth daily.   nitroGLYCERIN 0.4 MG SL tablet Commonly known as: NITROSTAT Place 1 tablet (0.4 mg total) under the tongue every 5 (five) minutes as needed for chest pain. PLACE ONE TABLET UNDER THE TONGUE EVERY 5 MINUTES FOR 3 DOSES AS NEEDED Strength: 0.4 mg   polyethylene glycol 17 g packet Commonly known as: MIRALAX / GLYCOLAX Take 17 g by mouth daily.   pravastatin 40 MG tablet Commonly known as: PRAVACHOL Take 1 tablet (40 mg total) by mouth every evening.   promethazine 25 MG  suppository Commonly known as: PHENERGAN Place 25 mg rectally every 12 (twelve) hours as needed for nausea or vomiting.   Restasis 0.05 % ophthalmic emulsion Generic drug: cycloSPORINE Place 1 drop into both eyes 2 (two) times daily.   senna 8.6 MG Tabs tablet Commonly known as: SENOKOT Take 2 tablets (17.2 mg total) by mouth 2 (two) times daily.   simethicone 80 MG chewable tablet Commonly known as: MYLICON Chew 80 mg by mouth every 6 (six) hours as needed for flatulence.   traMADol 50 MG tablet Commonly known as: ULTRAM Take 50 mg by mouth 4 (four) times daily as needed for pain.   True Metrix Blood Glucose Test test strip Generic drug: glucose blood USE TO TEST BLOOD SUGAR UP TO TWICE DAILY OR AS DIRECTED   Zinc 50 MG Tabs Take 1 tablet by mouth daily.         ROS:  A comprehensive review of systems was negative except for: Gastrointestinal: positive for abdominal pain and nausea, poor appetite   Blood pressure 128/70, pulse 74, temperature 98 F (36.7 C), temperature source Oral, resp. rate 14, height 6' 2"  (1.88 m), weight 192 lb (87.1 kg), SpO2 96 %. Physical Exam Vitals reviewed.  HENT:     Head: Atraumatic.     Nose: Nose normal.     Mouth/Throat:     Mouth: Mucous membranes are moist.  Eyes:     Extraocular Movements: Extraocular movements intact.  Cardiovascular:     Rate and Rhythm: Normal rate.  Pulmonary:     Effort: Pulmonary effort is normal.     Breath sounds: Normal breath sounds.  Abdominal:     General: There is distension.     Palpations: Abdomen is soft.     Tenderness: There is abdominal tenderness.  Musculoskeletal:  General: Swelling present.     Cervical back: Neck supple.  Neurological:     General: No focal deficit present.     Mental Status: He is alert and oriented to person, place, and time.  Psychiatric:        Mood and Affect: Mood normal.        Behavior: Behavior normal.     Results:  CLINICAL DATA:  Right  upper quadrant pain   EXAM: ULTRASOUND ABDOMEN LIMITED RIGHT UPPER QUADRANT   COMPARISON:  None Available.   FINDINGS: Gallbladder:   Wall echo shadow sign suggesting multiple gallstones. Gallbladder wall thickening measuring up to 7 mm. No sonographic Murphy sign noted by sonographer.   Common bile duct:   Diameter: 5 mm.  No intrahepatic biliary ductal dilatation.   Liver:   No focal lesion identified. Within normal limits in parenchymal echogenicity. Portal vein is patent on color Doppler imaging with normal direction of blood flow towards the liver.   Other: None.   IMPRESSION: Cholelithiasis with gallbladder wall thickening. Sonographic Murphy's sign was not elicited. No biliary ductal dilatation. Clinical correlation is suggested.     Electronically Signed   By: Keane Police D.O.   On: 11/04/2022 16:34  Assessment & Plan:  WESS BANEY is a 76 y.o. male with continued pain, nausea and poor appetite. He had acute cholecystitis and cholecystostomy tube placed by IR. This was dislodged and not replaced given the patent cystic duct. He had a [place] in November that demonstrated stones and thickened wall.  He and his brother are worrying about his appetite and weakness.    -Discussed that he may not have improvement in the symptoms after cholecystectomy as renal disease and dialysis could cause gastric motility.    Reviewed his case with Anesthesia and patient will need to have admission after his cholecystectomy. There is big risk given his renal disease, cardiac disease and fluid overload CHF, and risk that his symptoms will not improve. Also discussed his risk that his weakness and weight loss put him at risk of not healing well.   Him and his brother understand. Will get him scheduled in the next few weeks. 12/11 may be a good date and will admit him post operatively to ensure he does well.   PLAN: I counseled the patient about the indication, risks and benefits of  robotic assisted laparoscopic cholecystectomy.  He understands there is a very small chance for bleeding, infection, injury to normal structures (including common bile duct), conversion to open surgery, persistent symptoms, evolution of postcholecystectomy diarrhea, need for secondary interventions, anesthesia reaction, cardiopulmonary issues and other risks not specifically detailed here. I described the expected recovery, the plan for follow-up and the restrictions during the recovery phase.  All questions were answered.  All questions were answered to the satisfaction of the patient and family.    [name] 11/12/2022, 3:29 PM

## 2022-11-13 DIAGNOSIS — N2581 Secondary hyperparathyroidism of renal origin: Secondary | ICD-10-CM | POA: Diagnosis not present

## 2022-11-13 DIAGNOSIS — D689 Coagulation defect, unspecified: Secondary | ICD-10-CM | POA: Diagnosis not present

## 2022-11-13 DIAGNOSIS — E1122 Type 2 diabetes mellitus with diabetic chronic kidney disease: Secondary | ICD-10-CM | POA: Diagnosis not present

## 2022-11-13 DIAGNOSIS — E039 Hypothyroidism, unspecified: Secondary | ICD-10-CM | POA: Diagnosis not present

## 2022-11-13 DIAGNOSIS — Z992 Dependence on renal dialysis: Secondary | ICD-10-CM | POA: Diagnosis not present

## 2022-11-13 DIAGNOSIS — D631 Anemia in chronic kidney disease: Secondary | ICD-10-CM | POA: Diagnosis not present

## 2022-11-13 DIAGNOSIS — K8020 Calculus of gallbladder without cholecystitis without obstruction: Secondary | ICD-10-CM | POA: Diagnosis not present

## 2022-11-13 DIAGNOSIS — N186 End stage renal disease: Secondary | ICD-10-CM | POA: Diagnosis not present

## 2022-11-15 DIAGNOSIS — R5381 Other malaise: Secondary | ICD-10-CM | POA: Diagnosis not present

## 2022-11-15 DIAGNOSIS — N2581 Secondary hyperparathyroidism of renal origin: Secondary | ICD-10-CM | POA: Diagnosis not present

## 2022-11-15 DIAGNOSIS — D689 Coagulation defect, unspecified: Secondary | ICD-10-CM | POA: Diagnosis not present

## 2022-11-15 DIAGNOSIS — N186 End stage renal disease: Secondary | ICD-10-CM | POA: Diagnosis not present

## 2022-11-15 DIAGNOSIS — Z992 Dependence on renal dialysis: Secondary | ICD-10-CM | POA: Diagnosis not present

## 2022-11-15 DIAGNOSIS — E039 Hypothyroidism, unspecified: Secondary | ICD-10-CM | POA: Diagnosis not present

## 2022-11-15 DIAGNOSIS — D631 Anemia in chronic kidney disease: Secondary | ICD-10-CM | POA: Diagnosis not present

## 2022-11-15 DIAGNOSIS — E1122 Type 2 diabetes mellitus with diabetic chronic kidney disease: Secondary | ICD-10-CM | POA: Diagnosis not present

## 2022-11-18 DIAGNOSIS — D631 Anemia in chronic kidney disease: Secondary | ICD-10-CM | POA: Diagnosis not present

## 2022-11-18 DIAGNOSIS — K8020 Calculus of gallbladder without cholecystitis without obstruction: Secondary | ICD-10-CM | POA: Diagnosis not present

## 2022-11-18 DIAGNOSIS — N2581 Secondary hyperparathyroidism of renal origin: Secondary | ICD-10-CM | POA: Diagnosis not present

## 2022-11-18 DIAGNOSIS — Z992 Dependence on renal dialysis: Secondary | ICD-10-CM | POA: Diagnosis not present

## 2022-11-18 DIAGNOSIS — E1122 Type 2 diabetes mellitus with diabetic chronic kidney disease: Secondary | ICD-10-CM | POA: Diagnosis not present

## 2022-11-18 DIAGNOSIS — E039 Hypothyroidism, unspecified: Secondary | ICD-10-CM | POA: Diagnosis not present

## 2022-11-18 DIAGNOSIS — I10 Essential (primary) hypertension: Secondary | ICD-10-CM | POA: Diagnosis not present

## 2022-11-18 DIAGNOSIS — N189 Chronic kidney disease, unspecified: Secondary | ICD-10-CM | POA: Diagnosis not present

## 2022-11-18 DIAGNOSIS — M6281 Muscle weakness (generalized): Secondary | ICD-10-CM | POA: Diagnosis not present

## 2022-11-18 DIAGNOSIS — D689 Coagulation defect, unspecified: Secondary | ICD-10-CM | POA: Diagnosis not present

## 2022-11-18 DIAGNOSIS — I480 Paroxysmal atrial fibrillation: Secondary | ICD-10-CM | POA: Diagnosis not present

## 2022-11-18 DIAGNOSIS — E785 Hyperlipidemia, unspecified: Secondary | ICD-10-CM | POA: Diagnosis not present

## 2022-11-18 DIAGNOSIS — N186 End stage renal disease: Secondary | ICD-10-CM | POA: Diagnosis not present

## 2022-11-19 NOTE — H&P (Addendum)
[facility] History and Physical     Chief Complaint   Follow-up        [name] is a 76 y.o. male.  HPI: [name] is a known to me after IR placed cholecystostomy tube after acute cholecystitis after he had worsening CHF and new onset dialysis in August. He has been in and out of SNF and has been losing weight.    He has nausea and upper abdominal pain. He says he has no appetite and does not want to eat.  His last IR drain study demonstrated a patent cystic duct. Unfortunately, his IR drain was accidentally dislodged when he fell. He is now back at a facility and feels like he is getting weaker.    He is here today with his brother. Cardiology has reviewed his case and feel like he is an appropriate candidate for cholecystectomy.    He does dialysis on MWF at a facility in [place] he reports.        Past Medical History:  Diagnosis Date   Anginal pain ([place])     Anxiety     Benign prostatic hypertrophy      Nocturia   CAD (coronary artery disease)      a.  NSTEMI 10/2012 s/p DES to LAD & DES to 1st diagonal with residual diffuse nonobstructive dz in LCx/RCA    Chronic kidney disease, stage 3, mod decreased GFR (HCC)      Creatinine of 1.5 in 03/2009, STAGE 4 now   Degenerative joint disease     Depression      hx of   Diabetes mellitus, type II ([place]) 73/71/0626   Diastolic heart failure      Pedal edema, "a long time ago"   Erectile dysfunction     Flu 2013    hx of   GERD (gastroesophageal reflux disease)     Gout     Hemorrhoids     High cholesterol     Hypertension      Exercise induced   Myocardial infarction ([place])     Neuropathy      bilateral legs   NSVT (nonsustained ventricular tachycardia) ([place])      Exercise induced   Obesity     Tobacco abuse, in remission      30 pack years, quit 1994   Tubular adenoma 2014   Vertigo      everyday           Past Surgical History:  Procedure Laterality Date   BACK SURGERY   2007     x2   BIOPSY   02/05/2021    Procedure: BIOPSY;  Surgeon: Daneil Dolin, MD;  [facility];  Service: Endoscopy;;   CARDIAC CATHETERIZATION        with stent placement   COLONOSCOPY WITH ESOPHAGOGASTRODUODENOSCOPY (EGD) N/A 11/23/2013    Dr. Oneida Alar- TCS= left sided colitis and mild proctitis likely due to ischemic colitis, moderate internal hemorrhoids, small nodule in the rectum, bx=tubular adenoma, EGD=-mild non-erosive gastritis   COLONOSCOPY WITH PROPOFOL N/A 07/24/2020    hemorrhoids,  rectosigmoid lipoma, small adenoma removed.   CORONARY ANGIOPLASTY       ESOPHAGOGASTRODUODENOSCOPY (EGD) WITH PROPOFOL N/A 02/05/2021    Normal esophagus, erythematous mucosa in entire stomach, no obvious ulcer, s/p biopsy. Normal duodenum. Negative H.pylori.    HERNIA REPAIR       INSERTION OF DIALYSIS CATHETER N/A 08/06/2022    Procedure: INSERTION OF DIALYSIS CATHETER;  Surgeon: Rosetta Posner, MD;  Location: [facility];  Service: Vascular;  Laterality: N/A;   IR EXCHANGE BILIARY DRAIN   10/17/2022   IR PERC CHOLECYSTOSTOMY   08/20/2022   JOINT REPLACEMENT       LEFT HEART CATHETERIZATION WITH CORONARY ANGIOGRAM N/A 11/16/2012    Procedure: LEFT HEART CATHETERIZATION WITH CORONARY ANGIOGRAM;  Surgeon: Sherren Mocha, MD;  Location: [facility] CATH LAB;  Service: Cardiovascular;  Laterality: N/A;   PERCUTANEOUS CORONARY STENT INTERVENTION (PCI-S)   11/16/2012    Procedure: PERCUTANEOUS CORONARY STENT INTERVENTION (PCI-S);  Surgeon: Sherren Mocha, MD;  Location: [facility] CATH LAB;  Service: Cardiovascular;;   POLYPECTOMY   07/24/2020    Procedure: POLYPECTOMY;  Surgeon: Daneil Dolin, MD;  [facility];  Service: Endoscopy;;   SPINAL CORD STIMULATOR INSERTION N/A 06/10/2022    Procedure: Spinal cord stimulator placement;  Surgeon: Newman Pies, MD;  Location: [facility];  Service: Neurosurgery;  Laterality: N/A;  RM 19/ to follow   THUMB AMPUTATION Left      50 years ago   TOTAL KNEE ARTHROPLASTY  Right 11/15/2013    Procedure: RIGHT TOTAL KNEE ARTHROPLASTY;  Surgeon: Mauri Pole, MD;  Location: [facility];  Service: Orthopedics;  Laterality: Right;   TOTAL KNEE ARTHROPLASTY Left 03/07/2015    Procedure: LEFT TOTAL KNEE ARTHROPLASTY;  Surgeon: Paralee Cancel, MD;  Location: [facility] OR[facility];  Service: Orthopedics;  Laterality: Left;           Family History  Problem Relation Age of Onset   Hypertension Other     Cancer Other     Heart disease Other     Diabetes Other     Hypertension Mother     Cancer Father          brain   CAD Brother     Colon cancer Neg Hx        Social History         Tobacco Use   Smoking status: Former      Packs/day: 1.00      Years: 30.00      Total pack years: 30.00      Types: Cigarettes      Start date: 01/22/1965      Quit date: 12/23/1992      Years since quitting: 29.9   Smokeless tobacco: Never  Vaping Use   Vaping Use: Never used  Substance Use Topics   Alcohol use: No      Alcohol/week: 0.0 standard drinks of alcohol   Drug use: No      Medications: I have reviewed the patient's current medications. Allergies as of 11/12/2022         Reactions    Metformin Nausea And Vomiting    Niacin      Niacin And Related Other (See Comments)    Reaction: flushing and burning side effects even with extended release            Medication List           Accurate as of November 12, 2022  3:28 PM. If you have any questions, ask your nurse or doctor.              acarbose 50 MG tablet Commonly known as: PRECOSE Take 50 mg by mouth in the morning, at noon, and at bedtime.    acetaminophen 500 MG tablet Commonly known as: TYLENOL Take 2 tablets (1,000 mg total) by mouth every 6 (six) hours.  allopurinol 100 MG tablet Commonly known as: ZYLOPRIM Take 100 mg by mouth daily.    ascorbic acid 500 MG tablet Commonly known as: VITAMIN C Take 500 mg by mouth daily.    aspirin EC 81 MG tablet Take 1 tablet (81 mg total) by mouth daily.     calcitRIOL 0.25 MCG capsule Commonly known as: ROCALTROL Take 0.25 mcg by mouth every Sunday.    carboxymethylcellulose 0.5 % Soln Commonly known as: REFRESH PLUS Place 1 drop into both eyes daily as needed (dry eyes).    carvedilol 3.125 MG tablet Commonly known as: COREG Take 1 tablet (3.125 mg total) by mouth 2 (two) times daily.    Dialyvite 800-Zinc 15 0.8 MG Tabs Take 1 tablet by mouth daily.    docusate sodium 100 MG capsule Commonly known as: Colace Take 1 capsule (100 mg total) by mouth 2 (two) times daily.    epoetin alfa 3000 UNIT/ML injection Commonly known as: EPOGEN Inject into the skin.    ferrous sulfate 325 (65 FE) MG tablet Take 1 tablet (325 mg total) by mouth daily.    gabapentin 300 MG capsule Commonly known as: NEURONTIN Take 300 mg by mouth 4 (four) times daily.    hydrALAZINE 25 MG tablet Commonly known as: APRESOLINE Take 1 tablet (25 mg total) by mouth in the morning and at bedtime.    insulin NPH Human 100 UNIT/ML injection Commonly known as: NovoLIN N Inject 0.1 mLs (10 Units total) into the skin every morning. Ands syringes 1/day    isosorbide mononitrate 30 MG 24 hr tablet Commonly known as: IMDUR Take 0.5 tablets (15 mg total) by mouth daily.    Januvia 25 MG tablet Generic drug: sitaGLIPtin Take 25 mg by mouth daily.    meclizine 12.5 MG tablet Commonly known as: ANTIVERT Take 1 tablet (12.5 mg total) by mouth 3 (three) times daily as needed for dizziness.    melatonin 5 MG Tabs Take 5 mg by mouth at bedtime.    multivitamin with minerals tablet Take 1 tablet by mouth daily.    nitroGLYCERIN 0.4 MG SL tablet Commonly known as: NITROSTAT Place 1 tablet (0.4 mg total) under the tongue every 5 (five) minutes as needed for chest pain. PLACE ONE TABLET UNDER THE TONGUE EVERY 5 MINUTES FOR 3 DOSES AS NEEDED Strength: 0.4 mg    polyethylene glycol 17 g packet Commonly known as: MIRALAX / GLYCOLAX Take 17 g by mouth daily.     pravastatin 40 MG tablet Commonly known as: PRAVACHOL Take 1 tablet (40 mg total) by mouth every evening.    promethazine 25 MG suppository Commonly known as: PHENERGAN Place 25 mg rectally every 12 (twelve) hours as needed for nausea or vomiting.    Restasis 0.05 % ophthalmic emulsion Generic drug: cycloSPORINE Place 1 drop into both eyes 2 (two) times daily.    senna 8.6 MG Tabs tablet Commonly known as: SENOKOT Take 2 tablets (17.2 mg total) by mouth 2 (two) times daily.    simethicone 80 MG chewable tablet Commonly known as: MYLICON Chew 80 mg by mouth every 6 (six) hours as needed for flatulence.    traMADol 50 MG tablet Commonly known as: ULTRAM Take 50 mg by mouth 4 (four) times daily as needed for pain.    True Metrix Blood Glucose Test test strip Generic drug: glucose blood USE TO TEST BLOOD SUGAR UP TO TWICE DAILY OR AS DIRECTED    Zinc 50 MG Tabs Take 1 tablet by  mouth daily.               ROS:  A comprehensive review of systems was negative except for: Gastrointestinal: positive for abdominal pain and nausea, poor appetite    Blood pressure 128/70, pulse 74, temperature 98 F (36.7 C), temperature source Oral, resp. rate 14, height 6' 2"  (1.88 m), weight 192 lb (87.1 kg), SpO2 96 %. Physical Exam Vitals reviewed.  HENT:     Head: Atraumatic.     Nose: Nose normal.     Mouth/Throat:     Mouth: Mucous membranes are moist.  Eyes:     Extraocular Movements: Extraocular movements intact.  Cardiovascular:     Rate and Rhythm: Normal rate.  Pulmonary:     Effort: Pulmonary effort is normal.     Breath sounds: Normal breath sounds.  Abdominal:     General: There is distension.     Palpations: Abdomen is soft.     Tenderness: There is abdominal tenderness.  Musculoskeletal:        General: Swelling present.     Cervical back: Neck supple.  Neurological:     General: No focal deficit present.     Mental Status: He is alert and oriented to person,  place, and time.  Psychiatric:        Mood and Affect: Mood normal.        Behavior: Behavior normal.        Results:   CLINICAL DATA:  Right upper quadrant pain   EXAM: ULTRASOUND ABDOMEN LIMITED RIGHT UPPER QUADRANT   COMPARISON:  None Available.   FINDINGS: Gallbladder:   Wall echo shadow sign suggesting multiple gallstones. Gallbladder wall thickening measuring up to 7 mm. No sonographic Murphy sign noted by sonographer.   Common bile duct:   Diameter: 5 mm.  No intrahepatic biliary ductal dilatation.   Liver:   No focal lesion identified. Within normal limits in parenchymal echogenicity. Portal vein is patent on color Doppler imaging with normal direction of blood flow towards the liver.   Other: None.   IMPRESSION: Cholelithiasis with gallbladder wall thickening. Sonographic Murphy's sign was not elicited. No biliary ductal dilatation. Clinical correlation is suggested.     Electronically Signed   By: Keane Police D.O.   On: 11/04/2022 16:34   Assessment & Plan:  [name] is a 76 y.o. male with continued pain, nausea and poor appetite. He had acute cholecystitis and cholecystostomy tube placed by IR. This was dislodged and not replaced given the patent cystic duct. He had a [place] in November that demonstrated stones and thickened wall.  He and his brother are worrying about his appetite and weakness.      -Discussed that he may not have improvement in the symptoms after cholecystectomy as renal disease and dialysis could cause gastric motility.     Reviewed his case with Anesthesia and patient will need to have admission after his cholecystectomy. There is big risk given his renal disease, cardiac disease and fluid overload CHF, and risk that his symptoms will not improve. Also discussed his risk that his weakness and weight loss put him at risk of not healing well.    Him and his brother understand. Will get him scheduled in the next few weeks. 12/11  may be a good date and will admit him post operatively to ensure he does well.    PLAN: I counseled the patient about the indication, risks and benefits of robotic assisted laparoscopic cholecystectomy.  He understands there is a very small chance for bleeding, infection, injury to normal structures (including common bile duct), conversion to open surgery, persistent symptoms, evolution of postcholecystectomy diarrhea, need for secondary interventions, anesthesia reaction, cardiopulmonary issues and other risks not specifically detailed here. I described the expected recovery, the plan for follow-up and the restrictions during the recovery phase.  All questions were answered.   All questions were answered to the satisfaction of the patient and family.       [name] 11/12/2022, 3:29 PM

## 2022-11-20 DIAGNOSIS — L89150 Pressure ulcer of sacral region, unstageable: Secondary | ICD-10-CM | POA: Diagnosis not present

## 2022-11-20 DIAGNOSIS — D631 Anemia in chronic kidney disease: Secondary | ICD-10-CM | POA: Diagnosis not present

## 2022-11-20 DIAGNOSIS — E11628 Type 2 diabetes mellitus with other skin complications: Secondary | ICD-10-CM | POA: Diagnosis not present

## 2022-11-20 DIAGNOSIS — D689 Coagulation defect, unspecified: Secondary | ICD-10-CM | POA: Diagnosis not present

## 2022-11-20 DIAGNOSIS — N2581 Secondary hyperparathyroidism of renal origin: Secondary | ICD-10-CM | POA: Diagnosis not present

## 2022-11-20 DIAGNOSIS — E039 Hypothyroidism, unspecified: Secondary | ICD-10-CM | POA: Diagnosis not present

## 2022-11-20 DIAGNOSIS — R239 Unspecified skin changes: Secondary | ICD-10-CM | POA: Diagnosis not present

## 2022-11-20 DIAGNOSIS — Z992 Dependence on renal dialysis: Secondary | ICD-10-CM | POA: Diagnosis not present

## 2022-11-20 DIAGNOSIS — E1122 Type 2 diabetes mellitus with diabetic chronic kidney disease: Secondary | ICD-10-CM | POA: Diagnosis not present

## 2022-11-20 DIAGNOSIS — N186 End stage renal disease: Secondary | ICD-10-CM | POA: Diagnosis not present

## 2022-11-21 DIAGNOSIS — N186 End stage renal disease: Secondary | ICD-10-CM | POA: Diagnosis not present

## 2022-11-21 DIAGNOSIS — E1122 Type 2 diabetes mellitus with diabetic chronic kidney disease: Secondary | ICD-10-CM | POA: Diagnosis not present

## 2022-11-21 DIAGNOSIS — Z992 Dependence on renal dialysis: Secondary | ICD-10-CM | POA: Diagnosis not present

## 2022-11-22 DIAGNOSIS — M6281 Muscle weakness (generalized): Secondary | ICD-10-CM | POA: Diagnosis not present

## 2022-11-22 DIAGNOSIS — I10 Essential (primary) hypertension: Secondary | ICD-10-CM | POA: Diagnosis not present

## 2022-11-22 DIAGNOSIS — Z992 Dependence on renal dialysis: Secondary | ICD-10-CM | POA: Diagnosis not present

## 2022-11-22 DIAGNOSIS — I480 Paroxysmal atrial fibrillation: Secondary | ICD-10-CM | POA: Diagnosis not present

## 2022-11-22 DIAGNOSIS — N2581 Secondary hyperparathyroidism of renal origin: Secondary | ICD-10-CM | POA: Diagnosis not present

## 2022-11-22 DIAGNOSIS — D689 Coagulation defect, unspecified: Secondary | ICD-10-CM | POA: Diagnosis not present

## 2022-11-22 DIAGNOSIS — K8020 Calculus of gallbladder without cholecystitis without obstruction: Secondary | ICD-10-CM | POA: Diagnosis not present

## 2022-11-22 DIAGNOSIS — E785 Hyperlipidemia, unspecified: Secondary | ICD-10-CM | POA: Diagnosis not present

## 2022-11-22 DIAGNOSIS — N189 Chronic kidney disease, unspecified: Secondary | ICD-10-CM | POA: Diagnosis not present

## 2022-11-22 DIAGNOSIS — D631 Anemia in chronic kidney disease: Secondary | ICD-10-CM | POA: Diagnosis not present

## 2022-11-22 DIAGNOSIS — N186 End stage renal disease: Secondary | ICD-10-CM | POA: Diagnosis not present

## 2022-11-22 DIAGNOSIS — E1122 Type 2 diabetes mellitus with diabetic chronic kidney disease: Secondary | ICD-10-CM | POA: Diagnosis not present

## 2022-11-24 DIAGNOSIS — I5030 Unspecified diastolic (congestive) heart failure: Secondary | ICD-10-CM | POA: Diagnosis not present

## 2022-11-24 DIAGNOSIS — E11621 Type 2 diabetes mellitus with foot ulcer: Secondary | ICD-10-CM | POA: Diagnosis not present

## 2022-11-24 DIAGNOSIS — Z9181 History of falling: Secondary | ICD-10-CM | POA: Diagnosis not present

## 2022-11-24 DIAGNOSIS — M1990 Unspecified osteoarthritis, unspecified site: Secondary | ICD-10-CM | POA: Diagnosis not present

## 2022-11-24 DIAGNOSIS — I132 Hypertensive heart and chronic kidney disease with heart failure and with stage 5 chronic kidney disease, or end stage renal disease: Secondary | ICD-10-CM | POA: Diagnosis not present

## 2022-11-24 DIAGNOSIS — Z993 Dependence on wheelchair: Secondary | ICD-10-CM | POA: Diagnosis not present

## 2022-11-24 DIAGNOSIS — D631 Anemia in chronic kidney disease: Secondary | ICD-10-CM | POA: Diagnosis not present

## 2022-11-24 DIAGNOSIS — E1122 Type 2 diabetes mellitus with diabetic chronic kidney disease: Secondary | ICD-10-CM | POA: Diagnosis not present

## 2022-11-24 DIAGNOSIS — L97511 Non-pressure chronic ulcer of other part of right foot limited to breakdown of skin: Secondary | ICD-10-CM | POA: Diagnosis not present

## 2022-11-24 DIAGNOSIS — Z7984 Long term (current) use of oral hypoglycemic drugs: Secondary | ICD-10-CM | POA: Diagnosis not present

## 2022-11-24 DIAGNOSIS — L89152 Pressure ulcer of sacral region, stage 2: Secondary | ICD-10-CM | POA: Diagnosis not present

## 2022-11-24 DIAGNOSIS — E785 Hyperlipidemia, unspecified: Secondary | ICD-10-CM | POA: Diagnosis not present

## 2022-11-24 DIAGNOSIS — Z6824 Body mass index (BMI) 24.0-24.9, adult: Secondary | ICD-10-CM | POA: Diagnosis not present

## 2022-11-24 DIAGNOSIS — N186 End stage renal disease: Secondary | ICD-10-CM | POA: Diagnosis not present

## 2022-11-24 DIAGNOSIS — E1151 Type 2 diabetes mellitus with diabetic peripheral angiopathy without gangrene: Secondary | ICD-10-CM | POA: Diagnosis not present

## 2022-11-24 DIAGNOSIS — Z992 Dependence on renal dialysis: Secondary | ICD-10-CM | POA: Diagnosis not present

## 2022-11-24 DIAGNOSIS — I2510 Atherosclerotic heart disease of native coronary artery without angina pectoris: Secondary | ICD-10-CM | POA: Diagnosis not present

## 2022-11-25 DIAGNOSIS — D631 Anemia in chronic kidney disease: Secondary | ICD-10-CM | POA: Diagnosis not present

## 2022-11-25 DIAGNOSIS — N186 End stage renal disease: Secondary | ICD-10-CM | POA: Diagnosis not present

## 2022-11-25 DIAGNOSIS — E1122 Type 2 diabetes mellitus with diabetic chronic kidney disease: Secondary | ICD-10-CM | POA: Diagnosis not present

## 2022-11-25 DIAGNOSIS — N2581 Secondary hyperparathyroidism of renal origin: Secondary | ICD-10-CM | POA: Diagnosis not present

## 2022-11-25 DIAGNOSIS — Z992 Dependence on renal dialysis: Secondary | ICD-10-CM | POA: Diagnosis not present

## 2022-11-25 DIAGNOSIS — D689 Coagulation defect, unspecified: Secondary | ICD-10-CM | POA: Diagnosis not present

## 2022-11-26 DIAGNOSIS — Z992 Dependence on renal dialysis: Secondary | ICD-10-CM | POA: Diagnosis not present

## 2022-11-26 DIAGNOSIS — Z9181 History of falling: Secondary | ICD-10-CM | POA: Diagnosis not present

## 2022-11-26 DIAGNOSIS — L97511 Non-pressure chronic ulcer of other part of right foot limited to breakdown of skin: Secondary | ICD-10-CM | POA: Diagnosis not present

## 2022-11-26 DIAGNOSIS — Z993 Dependence on wheelchair: Secondary | ICD-10-CM | POA: Diagnosis not present

## 2022-11-26 DIAGNOSIS — M1990 Unspecified osteoarthritis, unspecified site: Secondary | ICD-10-CM | POA: Diagnosis not present

## 2022-11-26 DIAGNOSIS — E785 Hyperlipidemia, unspecified: Secondary | ICD-10-CM | POA: Diagnosis not present

## 2022-11-26 DIAGNOSIS — E11621 Type 2 diabetes mellitus with foot ulcer: Secondary | ICD-10-CM | POA: Diagnosis not present

## 2022-11-26 DIAGNOSIS — N186 End stage renal disease: Secondary | ICD-10-CM | POA: Diagnosis not present

## 2022-11-26 DIAGNOSIS — Z6824 Body mass index (BMI) 24.0-24.9, adult: Secondary | ICD-10-CM | POA: Diagnosis not present

## 2022-11-26 DIAGNOSIS — Z7984 Long term (current) use of oral hypoglycemic drugs: Secondary | ICD-10-CM | POA: Diagnosis not present

## 2022-11-26 DIAGNOSIS — L89152 Pressure ulcer of sacral region, stage 2: Secondary | ICD-10-CM | POA: Diagnosis not present

## 2022-11-26 DIAGNOSIS — I132 Hypertensive heart and chronic kidney disease with heart failure and with stage 5 chronic kidney disease, or end stage renal disease: Secondary | ICD-10-CM | POA: Diagnosis not present

## 2022-11-26 DIAGNOSIS — D631 Anemia in chronic kidney disease: Secondary | ICD-10-CM | POA: Diagnosis not present

## 2022-11-26 DIAGNOSIS — I5030 Unspecified diastolic (congestive) heart failure: Secondary | ICD-10-CM | POA: Diagnosis not present

## 2022-11-26 DIAGNOSIS — I2510 Atherosclerotic heart disease of native coronary artery without angina pectoris: Secondary | ICD-10-CM | POA: Diagnosis not present

## 2022-11-26 DIAGNOSIS — E1122 Type 2 diabetes mellitus with diabetic chronic kidney disease: Secondary | ICD-10-CM | POA: Diagnosis not present

## 2022-11-26 DIAGNOSIS — E1151 Type 2 diabetes mellitus with diabetic peripheral angiopathy without gangrene: Secondary | ICD-10-CM | POA: Diagnosis not present

## 2022-11-27 DIAGNOSIS — N186 End stage renal disease: Secondary | ICD-10-CM | POA: Diagnosis not present

## 2022-11-27 DIAGNOSIS — N2581 Secondary hyperparathyroidism of renal origin: Secondary | ICD-10-CM | POA: Diagnosis not present

## 2022-11-27 DIAGNOSIS — Z992 Dependence on renal dialysis: Secondary | ICD-10-CM | POA: Diagnosis not present

## 2022-11-27 DIAGNOSIS — D631 Anemia in chronic kidney disease: Secondary | ICD-10-CM | POA: Diagnosis not present

## 2022-11-27 DIAGNOSIS — E1122 Type 2 diabetes mellitus with diabetic chronic kidney disease: Secondary | ICD-10-CM | POA: Diagnosis not present

## 2022-11-27 DIAGNOSIS — D689 Coagulation defect, unspecified: Secondary | ICD-10-CM | POA: Diagnosis not present

## 2022-11-28 ENCOUNTER — Other Ambulatory Visit: Payer: Self-pay

## 2022-11-28 ENCOUNTER — Emergency Department (HOSPITAL_COMMUNITY): Payer: Medicare Other

## 2022-11-28 ENCOUNTER — Emergency Department (HOSPITAL_COMMUNITY)
Admission: EM | Admit: 2022-11-28 | Discharge: 2022-11-29 | Disposition: A | Discharge status: Home / Self Care | Payer: Medicare Other | Attending: Emergency Medicine | Admitting: Emergency Medicine

## 2022-11-28 ENCOUNTER — Encounter (HOSPITAL_COMMUNITY): Payer: Self-pay | Admitting: Emergency Medicine

## 2022-11-28 DIAGNOSIS — R5381 Other malaise: Secondary | ICD-10-CM | POA: Diagnosis not present

## 2022-11-28 DIAGNOSIS — M5442 Lumbago with sciatica, left side: Secondary | ICD-10-CM | POA: Insufficient documentation

## 2022-11-28 DIAGNOSIS — N186 End stage renal disease: Secondary | ICD-10-CM | POA: Diagnosis not present

## 2022-11-28 DIAGNOSIS — Z7982 Long term (current) use of aspirin: Secondary | ICD-10-CM | POA: Diagnosis not present

## 2022-11-28 DIAGNOSIS — N250 Renal osteodystrophy: Secondary | ICD-10-CM | POA: Diagnosis not present

## 2022-11-28 DIAGNOSIS — R52 Pain, unspecified: Secondary | ICD-10-CM | POA: Diagnosis not present

## 2022-11-28 DIAGNOSIS — R7309 Other abnormal glucose: Secondary | ICD-10-CM | POA: Diagnosis not present

## 2022-11-28 DIAGNOSIS — I120 Hypertensive chronic kidney disease with stage 5 chronic kidney disease or end stage renal disease: Secondary | ICD-10-CM | POA: Diagnosis not present

## 2022-11-28 DIAGNOSIS — Z743 Need for continuous supervision: Secondary | ICD-10-CM | POA: Diagnosis not present

## 2022-11-28 DIAGNOSIS — R609 Edema, unspecified: Secondary | ICD-10-CM | POA: Diagnosis not present

## 2022-11-28 DIAGNOSIS — M5441 Lumbago with sciatica, right side: Secondary | ICD-10-CM | POA: Diagnosis not present

## 2022-11-28 DIAGNOSIS — M549 Dorsalgia, unspecified: Secondary | ICD-10-CM | POA: Diagnosis not present

## 2022-11-28 DIAGNOSIS — Z992 Dependence on renal dialysis: Secondary | ICD-10-CM | POA: Insufficient documentation

## 2022-11-28 DIAGNOSIS — I700 Atherosclerosis of aorta: Secondary | ICD-10-CM | POA: Diagnosis not present

## 2022-11-28 DIAGNOSIS — D649 Anemia, unspecified: Secondary | ICD-10-CM | POA: Insufficient documentation

## 2022-11-28 DIAGNOSIS — G8929 Other chronic pain: Secondary | ICD-10-CM | POA: Insufficient documentation

## 2022-11-28 DIAGNOSIS — R531 Weakness: Secondary | ICD-10-CM | POA: Diagnosis not present

## 2022-11-28 DIAGNOSIS — M545 Low back pain, unspecified: Secondary | ICD-10-CM | POA: Diagnosis not present

## 2022-11-28 DIAGNOSIS — D631 Anemia in chronic kidney disease: Secondary | ICD-10-CM | POA: Diagnosis not present

## 2022-11-28 DIAGNOSIS — X509XXA Other and unspecified overexertion or strenuous movements or postures, initial encounter: Secondary | ICD-10-CM | POA: Diagnosis not present

## 2022-11-28 DIAGNOSIS — G834 Cauda equina syndrome: Secondary | ICD-10-CM

## 2022-11-28 DIAGNOSIS — M8588 Other specified disorders of bone density and structure, other site: Secondary | ICD-10-CM | POA: Diagnosis not present

## 2022-11-28 LAB — CBC WITH DIFFERENTIAL/PLATELET
Abs Immature Granulocytes: 0.02 10*3/uL (ref 0.00–0.07)
Basophils Absolute: 0.1 10*3/uL (ref 0.0–0.1)
Basophils Relative: 1 %
Eosinophils Absolute: 0.8 10*3/uL — ABNORMAL HIGH (ref 0.0–0.5)
Eosinophils Relative: 10 %
HCT: 27.1 % — ABNORMAL LOW (ref 39.0–52.0)
Hemoglobin: 8.5 g/dL — ABNORMAL LOW (ref 13.0–17.0)
Immature Granulocytes: 0 %
Lymphocytes Relative: 22 %
Lymphs Abs: 1.9 10*3/uL (ref 0.7–4.0)
MCH: 32.3 pg (ref 26.0–34.0)
MCHC: 31.4 g/dL (ref 30.0–36.0)
MCV: 103 fL — ABNORMAL HIGH (ref 80.0–100.0)
Monocytes Absolute: 0.9 10*3/uL (ref 0.1–1.0)
Monocytes Relative: 11 %
Neutro Abs: 4.9 10*3/uL (ref 1.7–7.7)
Neutrophils Relative %: 56 %
Platelets: 241 10*3/uL (ref 150–400)
RBC: 2.63 MIL/uL — ABNORMAL LOW (ref 4.22–5.81)
RDW: 17 % — ABNORMAL HIGH (ref 11.5–15.5)
WBC: 8.6 10*3/uL (ref 4.0–10.5)
nRBC: 0 % (ref 0.0–0.2)

## 2022-11-28 LAB — BASIC METABOLIC PANEL
Anion gap: 11 (ref 5–15)
BUN: 19 mg/dL (ref 8–23)
CO2: 28 mmol/L (ref 22–32)
Calcium: 9 mg/dL (ref 8.9–10.3)
Chloride: 98 mmol/L (ref 98–111)
Creatinine, Ser: 2.32 mg/dL — ABNORMAL HIGH (ref 0.61–1.24)
GFR, Estimated: 28 mL/min — ABNORMAL LOW (ref >60)
Glucose, Bld: 66 mg/dL — ABNORMAL LOW (ref 70–99)
Potassium: 2.8 mmol/L — ABNORMAL LOW (ref 3.5–5.1)
Sodium: 137 mmol/L (ref 135–145)

## 2022-11-28 LAB — CBG MONITORING, ED: Glucose-Capillary: 79 mg/dL (ref 70–99)

## 2022-11-28 LAB — HEPATITIS B SURFACE ANTIGEN: Hepatitis B Surface Ag: NONREACTIVE

## 2022-11-28 LAB — CBC
HCT: 22 % — ABNORMAL LOW (ref 39.0–52.0)
Hemoglobin: 6.8 g/dL — CL (ref 13.0–17.0)
MCH: 32.2 pg (ref 26.0–34.0)
MCHC: 30.9 g/dL (ref 30.0–36.0)
MCV: 104.3 fL — ABNORMAL HIGH (ref 80.0–100.0)
Platelets: 227 10*3/uL (ref 150–400)
RBC: 2.11 MIL/uL — ABNORMAL LOW (ref 4.22–5.81)
RDW: 17.2 % — ABNORMAL HIGH (ref 11.5–15.5)
WBC: 6.6 10*3/uL (ref 4.0–10.5)
nRBC: 0 % (ref 0.0–0.2)

## 2022-11-28 LAB — MAGNESIUM: Magnesium: 2 mg/dL (ref 1.7–2.4)

## 2022-11-28 MED ORDER — ISOSORBIDE MONONITRATE ER 30 MG PO TB24
15.0000 mg | ORAL_TABLET | Freq: Every day | ORAL | Status: DC
  Administered 2022-11-28 – 2022-11-29 (×2): 15 mg via ORAL
  Filled 2022-11-28 (×2): qty 1

## 2022-11-28 MED ORDER — HEPARIN SODIUM (PORCINE) 1000 UNIT/ML DIALYSIS: 1000.0000 [IU] | INTRAMUSCULAR | Status: DC | PRN

## 2022-11-28 MED ORDER — POTASSIUM CHLORIDE CRYS ER 20 MEQ PO TBCR: 40.0000 meq | EXTENDED_RELEASE_TABLET | Freq: Once | ORAL | Status: DC

## 2022-11-28 MED ORDER — ANTICOAGULANT SODIUM CITRATE 4% (200MG/5ML) IV SOLN: 5.0000 mL | Status: DC | PRN

## 2022-11-28 MED ORDER — ASPIRIN 81 MG PO TBEC
81.0000 mg | DELAYED_RELEASE_TABLET | Freq: Every day | ORAL | Status: DC
  Administered 2022-11-28 – 2022-11-29 (×2): 81 mg via ORAL
  Filled 2022-11-28 (×2): qty 1

## 2022-11-28 MED ORDER — CARVEDILOL 3.125 MG PO TABS: 3.1250 mg | ORAL_TABLET | Freq: Two times a day (BID) | ORAL | Status: DC

## 2022-11-28 MED ORDER — POTASSIUM CHLORIDE CRYS ER 20 MEQ PO TBCR
40.0000 meq | EXTENDED_RELEASE_TABLET | Freq: Once | ORAL | Status: AC
  Administered 2022-11-28: 40 meq via ORAL
  Filled 2022-11-28: qty 2

## 2022-11-28 MED ORDER — LIDOCAINE HCL (PF) 1 % IJ SOLN: 5.0000 mL | INTRAMUSCULAR | Status: DC | PRN

## 2022-11-28 MED ORDER — DOCUSATE SODIUM 100 MG PO CAPS
100.0000 mg | ORAL_CAPSULE | Freq: Two times a day (BID) | ORAL | Status: DC
  Administered 2022-11-28 – 2022-11-29 (×3): 100 mg via ORAL
  Filled 2022-11-28 (×3): qty 1

## 2022-11-28 MED ORDER — ONDANSETRON HCL 4 MG/2ML IJ SOLN
4.0000 mg | Freq: Once | INTRAMUSCULAR | Status: AC
  Administered 2022-11-28: 4 mg via INTRAVENOUS
  Filled 2022-11-28: qty 2

## 2022-11-28 MED ORDER — POTASSIUM CHLORIDE 10 MEQ/100ML IV SOLN
10.0000 meq | Freq: Once | INTRAVENOUS | Status: AC
  Administered 2022-11-28: 10 meq via INTRAVENOUS
  Filled 2022-11-28: qty 100

## 2022-11-28 MED ORDER — GABAPENTIN 300 MG PO CAPS
300.0000 mg | ORAL_CAPSULE | Freq: Four times a day (QID) | ORAL | Status: DC
  Administered 2022-11-28 – 2022-11-29 (×5): 300 mg via ORAL
  Filled 2022-11-28 (×5): qty 1

## 2022-11-28 MED ORDER — MORPHINE SULFATE (PF) 4 MG/ML IV SOLN
4.0000 mg | Freq: Once | INTRAVENOUS | Status: AC
  Administered 2022-11-28: 4 mg via INTRAVENOUS
  Filled 2022-11-28: qty 1

## 2022-11-28 MED ORDER — PRAVASTATIN SODIUM 40 MG PO TABS
40.0000 mg | ORAL_TABLET | Freq: Every evening | ORAL | Status: DC
  Administered 2022-11-28 – 2022-11-29 (×2): 40 mg via ORAL
  Filled 2022-11-28 (×2): qty 1

## 2022-11-28 MED ORDER — CYCLOSPORINE 0.05 % OP EMUL
1.0000 [drp] | Freq: Two times a day (BID) | OPHTHALMIC | Status: DC
  Administered 2022-11-28 – 2022-11-29 (×3): 1 [drp] via OPHTHALMIC
  Filled 2022-11-28 (×3): qty 30

## 2022-11-28 MED ORDER — FERROUS SULFATE 325 (65 FE) MG PO TABS
325.0000 mg | ORAL_TABLET | Freq: Every day | ORAL | Status: DC
  Administered 2022-11-28 – 2022-11-29 (×2): 325 mg via ORAL
  Filled 2022-11-28 (×2): qty 1

## 2022-11-28 MED ORDER — CARVEDILOL 3.125 MG PO TABS
3.1250 mg | ORAL_TABLET | Freq: Two times a day (BID) | ORAL | Status: DC
  Administered 2022-11-28 – 2022-11-29 (×3): 3.125 mg via ORAL
  Filled 2022-11-28 (×3): qty 1

## 2022-11-28 MED ORDER — INSULIN NPH (HUMAN) (ISOPHANE) 100 UNIT/ML ~~LOC~~ SUSP
10.0000 [IU] | Freq: Every day | SUBCUTANEOUS | Status: DC
  Filled 2022-11-28: qty 10

## 2022-11-28 MED ORDER — VITAMIN C 500 MG PO TABS
500.0000 mg | ORAL_TABLET | Freq: Every day | ORAL | Status: DC
  Administered 2022-11-28 – 2022-11-29 (×2): 500 mg via ORAL
  Filled 2022-11-28 (×2): qty 1

## 2022-11-28 MED ORDER — PENTAFLUOROPROP-TETRAFLUOROETH EX AERO: 1.0000 | INHALATION_SPRAY | CUTANEOUS | Status: DC | PRN

## 2022-11-28 MED ORDER — ALTEPLASE 2 MG IJ SOLR: 2.0000 mg | Freq: Once | INTRAMUSCULAR | Status: DC | PRN

## 2022-11-28 MED ORDER — LIDOCAINE-PRILOCAINE 2.5-2.5 % EX CREA: 1.0000 | TOPICAL_CREAM | CUTANEOUS | Status: DC | PRN

## 2022-11-28 MED ORDER — CALCITRIOL 0.25 MCG PO CAPS: 0.2500 ug | ORAL_CAPSULE | ORAL | Status: DC

## 2022-11-28 MED ORDER — ACARBOSE 50 MG PO TABS
50.0000 mg | ORAL_TABLET | Freq: Three times a day (TID) | ORAL | Status: DC
  Administered 2022-11-28 – 2022-11-29 (×3): 50 mg via ORAL
  Filled 2022-11-28 (×12): qty 1

## 2022-11-28 MED ORDER — MELATONIN 3 MG PO TABS
6.0000 mg | ORAL_TABLET | Freq: Every day | ORAL | Status: DC
  Administered 2022-11-28: 6 mg via ORAL
  Filled 2022-11-28: qty 2

## 2022-11-28 MED ORDER — HYDRALAZINE HCL 25 MG PO TABS
25.0000 mg | ORAL_TABLET | Freq: Two times a day (BID) | ORAL | Status: DC
  Administered 2022-11-28 – 2022-11-29 (×3): 25 mg via ORAL
  Filled 2022-11-28 (×3): qty 1

## 2022-11-28 MED ORDER — CHLORHEXIDINE GLUCONATE CLOTH 2 % EX PADS
6.0000 | MEDICATED_PAD | Freq: Every day | CUTANEOUS | Status: DC
  Administered 2022-11-29: 6 via TOPICAL

## 2022-11-28 NOTE — ED Triage Notes (Signed)
  Patient BIB EMS for back pain that started 2 days ago. Patient states he was being assisted to move by his niece and felt a pop in the middle of his back.  Patient states he had sharp pain that radiates down both his legs.  Patient states he normally uses wheelchair but can ambulate some, but has been unable to bear weight since hurting back. Pain 8/10, sharp.

## 2022-11-28 NOTE — Progress Notes (Signed)
CM noted [facility] has made a bed offer.  Spoke with patient's brother who happily accepts bed offer.  Will await insurance authorization.

## 2022-11-28 NOTE — NC FL2 (Signed)
[place] LEVEL OF CARE FORM     IDENTIFICATION  Patient Name: [name] Birthdate: 02/06/1946 Sex: male Admission Date (Current Location): 11/28/2022  [facility] and [place] Number:  [employer] and Address:  [facility] [address] [place]      Provider Number: [id number]  Attending Physician Name and Address:  Wyvonnia Dusky, MD  Relative Name and Phone Number:       Current Level of Care: Hospital Recommended Level of Care: [place] Prior Approval Number:    Date Approved/Denied:   PASRR Number: [id number]  Discharge Plan: SNF    Current Diagnoses: Patient Active Problem List   Diagnosis Date Noted   Pressure injury of sacral region, stage 1 09/24/2022   Hypotension    Sepsis ([place]) 09/13/2022   Acute cholecystitis 08/19/2022   ESRD (end stage renal disease) ([place]) 08/07/2022   HFrEF (heart failure with reduced ejection fraction) ([place])    (HFpEF) heart failure with preserved ejection fraction ([place]) 07/29/2022   Hypokalemia 07/29/2022   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   Acute respiratory failure with hypoxia ([place])    B12 deficiency 07/18/2022   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/2022   Diabetic neuropathy ([place]) 05/10/2022   Elevated troponin 05/10/2022   Type 2 diabetes mellitus with hyperglycemia ([place]) 05/10/2022   Other intervertebral disc degeneration, lumbar region 02/06/2022   Chronic gout, unspecified, 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[place]) 02/06/2022   Body mass index (BMI) 35.0-35.9, adult 02/06/2022   Obesity 02/06/2022   Primary generalized (osteo)arthritis 02/06/2022   Chest pain 02/06/2022   Lumbago with sciatica, right side 08/28/2021   Weakness of both lower extremities 08/28/2021   Body mass index (BMI) 33.0-33.9, adult 08/28/2021   Constipation 02/15/2021   Abdominal pain 01/02/2021   Diabetes mellitus ([place]) 11/09/2020   Hypertensive disorder 11/09/2020   Hypercholesterolemia  11/09/2020   H/O adenomatous polyp of colon 05/05/2020   Iron deficiency anemia 05/05/2020   Chronic kidney disease, stage IV (severe) ([place]) 12/14/2019   Acute gouty arthritis 10/25/2019   Leukocytosis 04/02/2019   Cellulitis of right hand 04/01/2019   Osteoarthritis of ankle and foot 02/08/2019   CHF (congestive heart failure) ([place]) 07/05/2015   Acute congestive heart failure ([place]) 07/04/2015   Acute on chronic diastolic congestive heart failure ([place]) 07/04/2015   Mixed hyperlipidemia 07/04/2015   Anxiety state 07/04/2015   GERD (gastroesophageal reflux disease) 07/04/2015   Heart block 07/04/2015   Gastroesophageal reflux disease 95/18/8416   Diastolic CHF, chronic (HCC) 03/11/2015   Chronic diastolic heart failure ([place]) 03/11/2015   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   Knee stiffness 01/13/2014   Gout 12/11/2013   Expected blood loss anemia 11/17/2013   S/P right TKA 11/15/2013   Anemia, normocytic normochromic 01/12/2013   Acute kidney injury superimposed on chronic kidney disease ([place]) 12/10/2012   Diabetes mellitus type 2 in obese ([place]) 12/10/2012   CAD S/P percutaneous coronary angioplasty 11/24/2012   Overweight(278.02) 11/17/2012   Chronic kidney disease, stage 3, cardiorenal syndrome 08/23/2011   Chronic kidney disease, stage 3 unspecified ([place]) 08/23/2011   Essential hypertension     Orientation RESPIRATION BLADDER Height & Weight     Self, Time, Situation, Place  Normal Continent Weight: 87.1 kg Height:  6' 2"  (188 cm)  BEHAVIORAL SYMPTOMS/MOOD NEUROLOGICAL BOWEL NUTRITION STATUS      Continent Diet  AMBULATORY STATUS COMMUNICATION OF NEEDS Skin   Extensive Assist Verbally Normal, Other (Comment) (stage 2 on bottom)  Personal Care Assistance Level of Assistance  Bathing, Feeding, Dressing Bathing Assistance: Maximum assistance Feeding assistance: Independent Dressing Assistance: Maximum assistance     Functional Limitations Info  Sight,  Hearing, Speech Sight Info: Adequate Hearing Info: Adequate Speech Info: Adequate    SPECIAL CARE FACTORS FREQUENCY  PT (By licensed PT), OT (By licensed OT)     PT Frequency: 5x weekly OT Frequency: 5x weekly            Contractures Contractures Info: Not present    Additional Factors Info  Code Status, Allergies Code Status Info: Full Allergies Info: metformin, niacin, and niacin and related           Current Medications (11/28/2022):  This is the current hospital active medication list Current Facility-Administered Medications  Medication Dose Route Frequency Provider Last Rate Last Admin   acarbose (PRECOSE) tablet 50 mg  50 mg Oral TID WC Trifan, Carola Rhine, MD       ascorbic acid (VITAMIN C) tablet 500 mg  500 mg Oral Daily Trifan, Carola Rhine, MD       aspirin EC tablet 81 mg  81 mg Oral Daily Wyvonnia Dusky, MD       [START ON 12/01/2022] calcitRIOL (ROCALTROL) capsule 0.25 mcg  0.25 mcg Oral Q Sun Trifan, Carola Rhine, MD       carvedilol (COREG) tablet 3.125 mg  3.125 mg Oral BID Wyvonnia Dusky, MD       cycloSPORINE (RESTASIS) 0.05 % ophthalmic emulsion 1 drop  1 drop Both Eyes BID Trifan, Carola Rhine, MD       docusate sodium (COLACE) capsule 100 mg  100 mg Oral BID Wyvonnia Dusky, MD       ferrous sulfate tablet 325 mg  325 mg Oral Daily Trifan, Carola Rhine, MD       gabapentin (NEURONTIN) capsule 300 mg  300 mg Oral QID Wyvonnia Dusky, MD       hydrALAZINE (APRESOLINE) tablet 25 mg  25 mg Oral Q12H Wyvonnia Dusky, MD       [START ON 11/29/2022] insulin NPH Human (NOVOLIN N) injection 10 Units  10 Units Subcutaneous [id number] Wyvonnia Dusky, MD       isosorbide mononitrate (IMDUR) 24 hr tablet 15 mg  15 mg Oral Daily Trifan, Carola Rhine, MD       melatonin tablet 5 mg  5 mg Oral QHS Wyvonnia Dusky, MD       pravastatin (PRAVACHOL) tablet 40 mg  40 mg Oral QPM Wyvonnia Dusky, MD       Current Outpatient Medications  Medication Sig Dispense Refill    acarbose (PRECOSE) 50 MG tablet Take 50 mg by mouth in the morning, at noon, and at bedtime.      acetaminophen (TYLENOL) 500 MG tablet Take 2 tablets (1,000 mg total) by mouth every 6 (six) hours. 30 tablet 0   allopurinol (ZYLOPRIM) 100 MG tablet Take 100 mg by mouth daily.     ascorbic acid (VITAMIN C) 500 MG tablet Take 500 mg by mouth daily.     aspirin EC 81 MG tablet Take 1 tablet (81 mg total) by mouth daily. 90 tablet 3   B Complex-C-Zn-Folic Acid (DIALYVITE 102-VOZD 15) 0.8 MG TABS Take 1 tablet by mouth daily.     calcitRIOL (ROCALTROL) 0.25 MCG capsule Take 0.25 mcg by mouth every Sunday.      carvedilol (COREG) 3.125 MG tablet Take 1 tablet (3.125 mg total) by  mouth 2 (two) times daily. 60 tablet 2   docusate sodium (COLACE) 100 MG capsule Take 1 capsule (100 mg total) by mouth 2 (two) times daily. 60 capsule 2   ferrous sulfate 325 (65 FE) MG tablet Take 1 tablet (325 mg total) by mouth daily. 30 tablet 3   gabapentin (NEURONTIN) 300 MG capsule Take 300 mg by mouth 4 (four) times daily.     hydrALAZINE (APRESOLINE) 25 MG tablet Take 1 tablet (25 mg total) by mouth in the morning and at bedtime. 60 tablet 2   insulin NPH Human (NOVOLIN N) 100 UNIT/ML injection Inject 0.1 mLs (10 Units total) into the skin every morning. Ands syringes 1/day 10 mL 11   isosorbide mononitrate (IMDUR) 30 MG 24 hr tablet Take 0.5 tablets (15 mg total) by mouth daily. 30 tablet 2   meclizine (ANTIVERT) 12.5 MG tablet Take 1 tablet (12.5 mg total) by mouth 3 (three) times daily as needed for dizziness. 30 tablet 0   melatonin 5 MG TABS Take 5 mg by mouth at bedtime.     Multiple Vitamins-Minerals (MULTIVITAMIN WITH MINERALS) tablet Take 1 tablet by mouth daily.     nitroGLYCERIN (NITROSTAT) 0.4 MG SL tablet Place 1 tablet (0.4 mg total) under the tongue every 5 (five) minutes as needed for chest pain. PLACE ONE TABLET UNDER THE TONGUE EVERY 5 MINUTES FOR 3 DOSES AS NEEDED Strength: 0.4 mg 25 tablet 3    polyethylene glycol (MIRALAX / GLYCOLAX) 17 g packet Take 17 g by mouth daily. 14 each 0   pravastatin (PRAVACHOL) 40 MG tablet Take 1 tablet (40 mg total) by mouth every evening. 90 tablet 3   promethazine (PHENERGAN) 25 MG suppository Place 25 mg rectally every 12 (twelve) hours as needed for nausea or vomiting.     RESTASIS 0.05 % ophthalmic emulsion Place 1 drop into both eyes 2 (two) times daily.     simethicone (MYLICON) 80 MG chewable tablet Chew 80 mg by mouth every 6 (six) hours as needed for flatulence.     sitaGLIPtin (JANUVIA) 25 MG tablet Take 25 mg by mouth daily.     traMADol (ULTRAM) 50 MG tablet Take 50 mg by mouth 4 (four) times daily as needed for pain.     Zinc 50 MG TABS Take 1 tablet by mouth daily.     carboxymethylcellulose (REFRESH PLUS) 0.5 % SOLN Place 1 drop into both eyes daily as needed (dry eyes).     epoetin alfa (EPOGEN) 3000 UNIT/ML injection Inject into the skin.     senna (SENOKOT) 8.6 MG TABS tablet Take 2 tablets (17.2 mg total) by mouth 2 (two) times daily. (Patient not taking: Reported on 11/28/2022) 120 tablet 0   TRUE METRIX BLOOD GLUCOSE TEST test strip USE TO TEST BLOOD SUGAR UP TO TWICE DAILY OR AS DIRECTED 180 each 3     Discharge Medications: Please see discharge summary for a list of discharge medications.  Relevant Imaging Results:  Relevant Lab Results:   Additional Information SSN: [id number]  Alvira Monday Hassan Rowan, RN

## 2022-11-28 NOTE — ED Notes (Signed)
Pt was weak to move out of bed and had trouble to raise from a lying position to a sitting position to try and ambulate with the walker.  Pt's brother stated "he can't walk". MD said to leave Pt in the bed. Brother gone to Pt's home to try to find pt's remote for his stimulator.

## 2022-11-28 NOTE — ED Provider Triage Note (Signed)
Emergency Medicine Provider Triage Evaluation Note  This note was opened in error.    Please see additional emergency medicine provider triage evaluation note written by me on the same date.   Veryl Speak, MD 11/29/22 0430

## 2022-11-28 NOTE — ED Notes (Addendum)
Pt c/o hand hurting due to potassium infusion. Asked MD if rate could be decreased. MD agreed

## 2022-11-28 NOTE — ED Provider Notes (Signed)
[facility] EMERGENCY DEPARTMENT Provider Note   CSN: [id number] Arrival date & time: 11/28/22  0602     History  Chief Complaint  Patient presents with   Back Pain    [name] is a 76 y.o. male with chronic back pain status post lumbar surgery presenting to the ED with complaint of worsening back pain.  He reports his niece, who lives with him and helps take care of him, was rolling him off of his back yesterday, and he felt and heard a "pop" in his lower back and now has pain radiating down both of his legs.  He is minimally ambulatory at baseline, reports that he was able to get up with a walker and take a few steps, but cannot now due to the pain.  He has been taking Tylenol for pain.  In the past he has been prescribed oxycodone as well as tramadol but says he is not on these medications because "they made my legs weak".  He says he has had a weakness in his legs for several months now.  HPI     Home Medications Prior to Admission medications   Medication Sig Start Date End Date Taking? Authorizing Provider  acarbose (PRECOSE) 50 MG tablet Take 50 mg by mouth in the morning, at noon, and at bedtime.  10/20/17  Yes [provider]  acetaminophen (TYLENOL) 500 MG tablet Take 2 tablets (1,000 mg total) by mouth every 6 (six) hours. 08/10/22  Yes Shahmehdi, Seyed A, MD  allopurinol (ZYLOPRIM) 100 MG tablet Take 100 mg by mouth daily. 08/17/20  Yes [provider]  ascorbic acid (VITAMIN C) 500 MG tablet Take 500 mg by mouth daily.   Yes [provider]  aspirin EC 81 MG tablet Take 1 tablet (81 mg total) by mouth daily. 09/14/19  Yes Herminio Commons, MD  B Complex-C-Zn-Folic Acid (DIALYVITE 701-XBLT 15) 0.8 MG TABS Take 1 tablet by mouth daily. 09/04/22  Yes [provider]  calcitRIOL (ROCALTROL) 0.25 MCG capsule Take 0.25 mcg by mouth every Sunday.  12/13/16  Yes [provider]  carvedilol (COREG) 3.125 MG tablet Take 1 tablet  (3.125 mg total) by mouth 2 (two) times daily. 10/10/22  Yes Bhagat, Bhavinkumar, PA  docusate sodium (COLACE) 100 MG capsule Take 1 capsule (100 mg total) by mouth 2 (two) times daily. 09/16/22 09/16/23 Yes Shah, Pratik D, DO  ferrous sulfate 325 (65 FE) MG tablet Take 1 tablet (325 mg total) by mouth daily. 09/16/22 09/16/23 Yes Shah, Pratik D, DO  gabapentin (NEURONTIN) 300 MG capsule Take 300 mg by mouth 4 (four) times daily.   Yes [provider]  hydrALAZINE (APRESOLINE) 25 MG tablet Take 1 tablet (25 mg total) by mouth in the morning and at bedtime. 10/10/22  Yes Bhagat, Bhavinkumar, PA  insulin NPH Human (NOVOLIN N) 100 UNIT/ML injection Inject 0.1 mLs (10 Units total) into the skin every morning. Ands syringes 1/day 09/16/22  Yes Shah, Pratik D, DO  isosorbide mononitrate (IMDUR) 30 MG 24 hr tablet Take 0.5 tablets (15 mg total) by mouth daily. 10/10/22  Yes Bhagat, Bhavinkumar, PA  meclizine (ANTIVERT) 12.5 MG tablet Take 1 tablet (12.5 mg total) by mouth 3 (three) times daily as needed for dizziness. 09/16/22  Yes Shah, Pratik D, DO  melatonin 5 MG TABS Take 5 mg by mouth at bedtime.   Yes [provider]  Multiple Vitamins-Minerals (MULTIVITAMIN WITH MINERALS) tablet Take 1 tablet by mouth daily.  Yes [provider]  nitroGLYCERIN (NITROSTAT) 0.4 MG SL tablet Place 1 tablet (0.4 mg total) under the tongue every 5 (five) minutes as needed for chest pain. PLACE ONE TABLET UNDER THE TONGUE EVERY 5 MINUTES FOR 3 DOSES AS NEEDED Strength: 0.4 mg 02/07/22  Yes Branch, Alphonse Guild, MD  polyethylene glycol (MIRALAX / GLYCOLAX) 17 g packet Take 17 g by mouth daily. 08/11/22  Yes Shahmehdi, Seyed A, MD  pravastatin (PRAVACHOL) 40 MG tablet Take 1 tablet (40 mg total) by mouth every evening. 07/31/21 02/09/28 Yes Imogene Burn, PA-C  promethazine (PHENERGAN) 25 MG suppository Place 25 mg rectally every 12 (twelve) hours as needed for nausea or vomiting.   Yes [provider]  RESTASIS 0.05 % ophthalmic emulsion Place 1 drop into both eyes 2 (two) times daily. 06/19/22  Yes [provider]  simethicone (MYLICON) 80 MG chewable tablet Chew 80 mg by mouth every 6 (six) hours as needed for flatulence.   Yes [provider]  sitaGLIPtin (JANUVIA) 25 MG tablet Take 25 mg by mouth daily.   Yes [provider]  traMADol (ULTRAM) 50 MG tablet Take 50 mg by mouth 4 (four) times daily as needed for pain. 08/14/22  Yes [provider]  Zinc 50 MG TABS Take 1 tablet by mouth daily.   Yes [provider]  carboxymethylcellulose (REFRESH PLUS) 0.5 % SOLN Place 1 drop into both eyes daily as needed (dry eyes).    [provider]  epoetin alfa (EPOGEN) 3000 UNIT/ML injection Inject into the skin. 07/03/22   [provider]  senna (SENOKOT) 8.6 MG TABS tablet Take 2 tablets (17.2 mg total) by mouth 2 (two) times daily. Patient not taking: Reported on 11/28/2022 08/10/22   Deatra James, MD  TRUE METRIX BLOOD GLUCOSE TEST test strip USE TO TEST BLOOD SUGAR UP TO TWICE DAILY OR AS DIRECTED 08/31/21   Renato Shin, MD      Allergies    Metformin, Niacin, and Niacin and related    Review of Systems   Review of Systems  Physical Exam Updated Vital Signs BP (!) 118/49   Pulse (!) 47   Temp 97.7 F (36.5 C)   Resp 15   Ht 6' 2"  (1.88 m)   Wt 87.1 kg   SpO2 100%   BMI 24.65 kg/m  Physical Exam Constitutional:      General: He is not in acute distress. HENT:     Head: Normocephalic and atraumatic.  Eyes:     Conjunctiva/sclera: Conjunctivae normal.     Pupils: Pupils are equal, round, and reactive to light.  Cardiovascular:     Rate and Rhythm: Normal rate and regular rhythm.  Pulmonary:     Effort: Pulmonary effort is normal. No respiratory distress.  Abdominal:     General: There is no distension.     Tenderness: There is no abdominal tenderness.  Musculoskeletal:     Right lower leg: Edema present.      Left lower leg: Edema present.  Skin:    General: Skin is warm and dry.  Neurological:     Mental Status: He is alert and oriented to person, place, and time.     Comments: Weakness of bilateral lower extremities which is symmetrical and likely due to poor effort  Psychiatric:        Mood and Affect: Mood normal.        Behavior: Behavior normal.     ED Results /  Procedures / Treatments   Labs (all labs ordered are listed, but only abnormal results are displayed) Labs Reviewed  BASIC METABOLIC PANEL - Abnormal; Notable for the following components:      Result Value   Potassium 2.8 (*)    Glucose, Bld 66 (*)    Creatinine, Ser 2.32 (*)    GFR, Estimated 28 (*)    All other components within normal limits  CBC WITH DIFFERENTIAL/PLATELET - Abnormal; Notable for the following components:   RBC 2.63 (*)    Hemoglobin 8.5 (*)    HCT 27.1 (*)    MCV 103.0 (*)    RDW 17.0 (*)    Eosinophils Absolute 0.8 (*)    All other components within normal limits  MAGNESIUM    EKG None  Radiology CT Lumbar Spine Wo Contrast  Result Date: 11/28/2022 CLINICAL DATA:  76 year old male with back pain after "felt a pop" while moving. Prior surgery. EXAM: CT LUMBAR SPINE WITHOUT CONTRAST TECHNIQUE: Multidetector CT imaging of the lumbar spine was performed without intravenous contrast administration. Multiplanar CT image reconstructions were also generated. RADIATION DOSE REDUCTION: This exam was performed according to the departmental dose-optimization program which includes automated exposure control, adjustment of the mA and/or kV according to patient size and/or use of iterative reconstruction technique. COMPARISON:  CT Abdomen and Pelvis 11/04/2022. Lumbar MRI 09/06/2021. FINDINGS: Segmentation: Normal, the same numbering system used on the MRI last year. Alignment: Stable chronic straightening of lumbar lordosis. Vertebrae: Chronic osteopenia. Chronic interbody ankylosis from flowing  endplate osteophytes Q22-W9 and L1-L2. And chronic lumbar fusion below that level, operative details below. Chronic L1 inferior endplate deformity and sclerosis appears stable from last month and a CT Abdomen and Pelvis in September. Likewise, visible T11 and T12 vertebral height appears stable. Visible sacral ala and SI joints also appear intact. No acute osseous abnormality identified. Visible lower ribs appears stable and intact. Paraspinal and other soft tissues: Stable, including Aortoiliac calcified atherosclerosis. Stable postoperative changes to the paraspinal soft tissues and right flank stimulator or generator device. Disc levels: Chronic interbody ankylosis from flowing endplate osteophytes N98-X2 and L1-L2. L2 through L5 decompression and fusion with solid appearing arthrodesis and no evidence of loosening. Mild chronic L5-S1 adjacent segment disease, primarily facet degeneration. IMPRESSION: 1.  No acute osseous abnormality identified. 2. Stable and satisfactory appearing L2 through L5 decompression and fusion, with superimposed chronic T12 through L2 ankylosis due to flowing endplate osteophytes. Mild chronic L5-S1 adjacent segment disease. 3.  Aortic Atherosclerosis (ICD10-I70.0). Electronically Signed   By: Genevie Ann M.D.   On: 11/28/2022 07:13    Procedures Procedures    Medications Ordered in ED Medications  acarbose (PRECOSE) tablet 50 mg (has no administration in time range)  ascorbic acid (VITAMIN C) tablet 500 mg (has no administration in time range)  aspirin EC tablet 81 mg (has no administration in time range)  calcitRIOL (ROCALTROL) capsule 0.25 mcg (has no administration in time range)  docusate sodium (COLACE) capsule 100 mg (has no administration in time range)  ferrous sulfate tablet 325 mg (has no administration in time range)  gabapentin (NEURONTIN) capsule 300 mg (has no administration in time range)  hydrALAZINE (APRESOLINE) tablet 25 mg (has no administration in time  range)  insulin NPH Human (NOVOLIN N) injection 10 Units (has no administration in time range)  isosorbide mononitrate (IMDUR) 24 hr tablet 15 mg (has no administration in time range)  melatonin tablet 6 mg (has no administration in time range)  pravastatin (  PRAVACHOL) tablet 40 mg (has no administration in time range)  cycloSPORINE (RESTASIS) 0.05 % ophthalmic emulsion 1 drop (has no administration in time range)  carvedilol (COREG) tablet 3.125 mg (has no administration in time range)  morphine (PF) 4 MG/ML injection 4 mg (4 mg Intravenous Given 11/28/22 0723)  ondansetron (ZOFRAN) injection 4 mg (4 mg Intravenous Given 11/28/22 0723)  potassium chloride 10 mEq in 100 mL IVPB (0 mEq Intravenous Stopped 11/28/22 1058)  potassium chloride SA (KLOR-CON M) CR tablet 40 mEq (40 mEq Oral Given 11/28/22 0851)    ED Course/ Medical Decision Making/ A&P Clinical Course as of 11/28/22 1429  Thu Nov 28, 2022  0852 On my repeat assessment patient is reporting intractable pain in his lower extremity and now saying that his weakness is worse than before.  There is a difficult exam as he demonstrate signs of dementia, repeating himself over and over, I cannot recall the questions and what I spoke to him just a few minutes earlier.  Given this uncertainty I have ordered an MRI of the lumbar spine to ensure there is no worsening compression of the central canal.  He is getting potassium repletion right now.  The remainder of his labs appear to be at baseline including his elevated creatinine and his baseline anemia.  He has some mild borderline hypoglycemia but has not eaten yet today.  I will have the nurse try to get him some food [MT]  1056 MRI could not be completed due to spinal stimulator implant.  Patient's brother is en route to hospital now (I could not reach his niece) and I will discuss further with him. [MT]  1117 Patient's brother now presents to the bedside and advised further contacts.  He clarifies  that the patient has been in and out of rehabs or nursing facilities for weakness in his legs and chronic back pain for some time, but he often gets this mass because "his insurance runs out".  The patient has been living by himself at home, and his niece visits him daily to check in on him, but he does not have any regular health care at home or assistance at home.  The patient is in fact on dialysis on Monday Wednesday and Friday, is picked up from his house and taken to dialysis and then dropped off at home again. [MT]  1117 Patient's brother expresses concern that the patient cannot safely be discharged home.  In this clinical context and now have a lower concern for an acute change or weakness in the patient's lower extremities this is appears to be more of a chronic ongoing condition. [MT]  51 Social worker reports that they would need a PT evaluation for placement back in the skilled nursing facility.  There is concern the patient continues to sign himself out and not pay for his care at the nursing facilities, we may have some limited options for placement. [MT]  1319 I will consult nephrology regarding patient's dialysis needs while he is boarding in the ED, here Monday Wednesday Friday [MT]    Clinical Course User Index [MT] Ellarose Brandi, Carola Rhine, MD                           Medical Decision Making Amount and/or Complexity of Data Reviewed Labs: ordered.  Risk OTC drugs. Prescription drug management.   This patient presents to the ED with concern for worsening lower back pain. This involves an extensive number  of treatment options, and is a complaint that carries with it a high risk of complications and morbidity.  The differential diagnosis includes occult fracture versus sciatica versus radiculopathy versus other  Co-morbidities that complicate the patient evaluation: History of chronic back pain and likely chronic nerve impingement at high risk of complication  Additional history  obtained from EMS  External records from outside source obtained and reviewed including MRI L spine Sept 2022 reporting L2-L5 PLIF, patent canal with mild to moderate foraminal stenosis, L1-L2 severe right and mid left foraminal stenosis with mild canal stenosis, L5-S1 osseous encroachment of the right distal lateral foramina  I ordered and personally interpreted labs.  The pertinent results include: Mild hypokalemia, otherwise unremarkable lab findings.  I ordered imaging studies including CT lumbar spine I independently visualized and interpreted imaging which showed no acute fracture, stable L2-L5 decompression and fusion I agree with the radiologist interpretation  I ordered medication including morphine for pain  I have reviewed the patients home medicines and have made adjustments as needed  Test Considered: doubt cauda equina clinically - did not feel emergent MRI indicated.  He has no saddle anesthesia or incontinence problems or complaints.  After the interventions noted above, I reevaluated the patient and found that they have: stayed the same  Social Determinants of Health:SW consulted  Dispostion:  After consideration of the diagnostic results and the patients response to treatment, I feel that the patent would benefit from [facility] evaluation \ SNF placement.  Given that the patient is on dialysis I would be cautious with further repletion of potassium.  He was given oral and IV potassium here which should tide him over until his neck scheduled dialysis, which would be tomorrow.  Patient undergoes dialysis on Monday Wednesday Friday.  I reordered his home medications as well while he remains boarding in the ED.  The patient is evidently unhappy about staying in the hospital emergency department, but his brother makes clear that the patient is not able to care for himself and is basically relying on family to come to get him around the house.  At the time of signout we were  charging up the patient's stimulator battery pack, which may take several hours.  If he remains boarding in the emergency department tomorrow morning, there may be consideration for obtaining an MRI before the weekend of his lumbar spine.        Final Clinical Impression(s) / ED Diagnoses Final diagnoses:  Chronic bilateral low back pain with bilateral sciatica    Rx / DC Orders ED Discharge Orders     None         Wyvonnia Dusky, MD 11/28/22 1429

## 2022-11-28 NOTE — ED Provider Triage Note (Signed)
Emergency Medicine Provider Triage Evaluation Note  [name] , a 76 y.o. male  was evaluated in triage.  Pt complains of low back pain.  Patient reports being rolled over in bed by his niece who is his caretaker when he felt a "pop"in his low back.  He has been experiencing pain since.  He describes a several month history of weakness in his legs that has also become worse since feeling this noise in his back..  Review of Systems  Positive: Weakness and back pain Negative: Fevers, chills, chest pain, shortness of breath, or cough.  He denies any bowel or bladder complaints.  Physical Exam  BP 126/67 (BP Location: Right Arm)   Pulse 60   Temp 98 F (36.7 C) (Oral)   Resp 18   Ht 6' 2"  (1.88 m)   Wt 87.1 kg   SpO2 99%   BMI 24.65 kg/m  Gen:   Awake, no distress   Resp:  Normal effort  MSK:   Moves extremities without difficulty.  He does have movement of both lower extremities, however has flattening of the feet and diminished muscle tone of the lower extremities   Medical Decision Making  Medically screening exam initiated at 6:48 AM.  Appropriate orders placed.  [name] was informed that the remainder of the evaluation will be completed by another provider, this initial triage assessment does not replace that evaluation, and the importance of remaining in the ED until their evaluation is complete.     [name] Speak, MD 11/28/22 [phone]

## 2022-11-28 NOTE — Evaluation (Signed)
Physical Therapy Evaluation Patient Details Name: [name] MRN: [id number] DOB: [birth year] Today's Date: 11/28/2022  History of Present Illness  CLEARANCE [name] is a 76 y.o. male with chronic back pain status post lumbar surgery presenting to the ED with complaint of worsening back pain.  He reports his niece, who lives with him and helps take care of him, was rolling him off of his back yesterday, and he felt and heard a "pop" in his lower back and now has pain radiating down both of his legs.  He is minimally ambulatory at baseline, reports that he was able to get up with a walker and take a few steps, but cannot now due to the pain.  He has been taking Tylenol for pain.  In the past he has been prescribed oxycodone as well as tramadol but says he is not on these medications because "they made my legs weak".  He says he has had a weakness in his legs for several months now.   Clinical Impression  Patient demonstrates slow labored movement for rolling to side and sitting up from side lying position with mild c/o increased in pain in low back, has difficulty completing sit to stands/transfers due to BLE weakness, slow labored cadence without loss of balance during ambulation and limited due to increasing low back pain and fatigue.  Patient tolerated sitting up in chair after therapy - RN aware and instructed to have patient log roll when getting in/out of bed to help to decrease low back pain/discomfort.  Patient will benefit from continued skilled physical therapy in hospital and recommended venue below to increase strength, balance, endurance for safe ADLs and gait.          Recommendations for follow up therapy are one component of a multi-disciplinary discharge planning process, led by the attending physician.  Recommendations may be updated based on patient status, additional functional criteria and insurance authorization.  Follow Up Recommendations Skilled nursing-short term rehab (<3  hours/day) Can patient physically be transported by private vehicle: Yes    Assistance Recommended at Discharge Set up Supervision/Assistance  Patient can return home with the following  A lot of help with walking and/or transfers;A lot of help with bathing/dressing/bathroom;Assistance with cooking/housework;Help with stairs or ramp for entrance    Equipment Recommendations None recommended by PT  Recommendations for Other Services       Functional Status Assessment Patient has had a recent decline in their functional status and demonstrates the ability to make significant improvements in function in a reasonable and predictable amount of time.     Precautions / Restrictions Precautions Precautions: Fall Restrictions Weight Bearing Restrictions: No      Mobility  Bed Mobility Overal bed mobility: Needs Assistance Bed Mobility: Rolling, Sidelying to Sit Rolling: Min assist, Mod assist Sidelying to sit: Mod assist       General bed mobility comments: labored movement with fair/good carryover for rolling to side and sitting up from sidelying position    Transfers Overall transfer level: Needs assistance Equipment used: Rolling walker (2 wheels) Transfers: Sit to/from Stand, Bed to chair/wheelchair/BSC Sit to Stand: Min assist, Mod assist   Step pivot transfers: Min assist, Mod assist       General transfer comment: difficulty completing sit to stands due to BLE weakness    Ambulation/Gait Ambulation/Gait assistance: Min assist, Mod assist Gait Distance (Feet): 30 Feet Assistive device: Rolling walker (2 wheels) Gait Pattern/deviations: Decreased step length - right, Decreased step length - left,  Decreased stride length Gait velocity: decreased     General Gait Details: slow labored cadence with c/o minor back pain without radiation down legs and limited mostly due to fatigue  Stairs            Wheelchair Mobility    Modified Rankin (Stroke Patients  Only)       Balance Overall balance assessment: Needs assistance Sitting-balance support: Feet supported, No upper extremity supported Sitting balance-Leahy Scale: Fair Sitting balance - Comments: fair/good seated at EOB   Standing balance support: Reliant on assistive device for balance, During functional activity, Bilateral upper extremity supported Standing balance-Leahy Scale: Poor Standing balance comment: fair/poor using RW                             Pertinent Vitals/Pain Pain Assessment Pain Assessment: Faces Faces Pain Scale: Hurts little more Pain Location: low back with radiation down BLE Pain Descriptors / Indicators: Radiating, Sore, Sharp Pain Intervention(s): Limited activity within patient's tolerance, Monitored during session, Repositioned    Home Living Family/patient expects to be discharged to:: Private residence Living Arrangements: Other relatives Available Help at Discharge: Family;Available PRN/intermittently Type of Home: House Home Access: Stairs to enter Entrance Stairs-Rails: Right;Left;Can reach both Entrance Stairs-Number of Steps: 3   Home Layout: One level Home Equipment: Conservation officer, nature (2 wheels);Cane - single point;Grab bars - tub/shower;BSC/3in1      Prior Function Prior Level of Function : Needs assist       Physical Assist : Mobility (physical) Mobility (physical): Bed mobility;Transfers;Gait;Stairs   Mobility Comments: Household ambulator using RW ADLs Comments: assisted by family     Hand Dominance   Dominant Hand: Right    Extremity/Trunk Assessment   Upper Extremity Assessment Upper Extremity Assessment: Generalized weakness    Lower Extremity Assessment Lower Extremity Assessment: Generalized weakness    Cervical / Trunk Assessment Cervical / Trunk Assessment: Normal  Communication   Communication: No difficulties  Cognition Arousal/Alertness: Awake/alert Behavior During Therapy: WFL for tasks  assessed/performed Overall Cognitive Status: Within Functional Limits for tasks assessed                                          General Comments      Exercises     Assessment/Plan    PT Assessment Patient needs continued PT services  PT Problem List Decreased strength;Decreased activity tolerance;Decreased balance;Decreased mobility;Pain       PT Treatment Interventions DME instruction;Gait training;Stair training;Functional mobility training;Therapeutic activities;Therapeutic exercise;Balance training;Patient/family education    PT Goals (Current goals can be found in the Care Plan section)  Acute Rehab PT Goals Patient Stated Goal: return home with family to assist PT Goal Formulation: With patient Time For Goal Achievement: 12/12/22 Potential to Achieve Goals: Good    Frequency Min 2X/week     Co-evaluation               AM-PAC PT "6 Clicks" Mobility  Outcome Measure Help needed turning from your back to your side while in a flat bed without using bedrails?: A Lot Help needed moving from lying on your back to sitting on the side of a flat bed without using bedrails?: A Lot Help needed moving to and from a bed to a chair (including a wheelchair)?: A Lot Help needed standing up from a chair using your arms (e.g., wheelchair  or bedside chair)?: A Little Help needed to walk in hospital room?: A Little Help needed climbing 3-5 steps with a railing? : A Lot 6 Click Score: 14    End of Session   Activity Tolerance: Patient tolerated treatment well;Patient limited by fatigue;Patient limited by pain Patient left: in chair;with call bell/phone within reach Nurse Communication: Mobility status PT Visit Diagnosis: Unsteadiness on feet (R26.81);Other abnormalities of gait and mobility (R26.89);Muscle weakness (generalized) (M62.81)    Time: 9643-8381 PT Time Calculation (min) (ACUTE ONLY): 23 min   Charges:   PT Evaluation $PT Eval Moderate  Complexity: 1 Mod PT Treatments $Therapeutic Activity: 23-37 mins        3:22 PM, 11/28/22 [name], MPT Physical Therapist with [facility] [phone] office [phone] mobile phone

## 2022-11-28 NOTE — Progress Notes (Signed)
Transition of Care [facility]) - Emergency Department Mini Assessment   Patient Details  Name: [name] MRN: [id number] Date of Birth: 1946/03/17  [facility]) CM/SW Contact:    Joaquin Courts, RN Phone Number: 11/28/2022, 1:31 PM   Clinical Narrative: CM had an extensive conversation with patient's brother regarding discharge planning.  Brother shares that patient has had previous stays at [facility] for rehab, most recently at [facility], where patient was discharge from several days ago.  Brother reports patient does well with rehab bur once he gets home he deteriorates significantly.  Brother feels patient may need a long term care solution but shares that patient does not want to live in a nursing home and this has been a barrier in the past.  CM discussed that patient will be seen by therapy in the ED and based on those recommendations we may be able to assist with placement into short term SNF for additional rehab, however encouraged brother to have an open and honest discussion with patient regarding his long term concerns, specifically that brother shares he will be unable to continue to assist patient at home going forward.  TOC is awaiting PT eval at this time.   ED Mini Assessment: What brought you to the Emergency Department? : weakness  Barriers to Discharge: No SNF bed  Barrier interventions: fl2 faxed out  Means of departure: Ambulance  Interventions which prevented an admission or readmission: SNF Placement    Patient Contact and Communications     Spoke with: brother Contact Date: 11/28/22,   Contact time: 1323   Call outcome: would like placement  Patient states their goals for this hospitalization and ongoing recovery are:: to get better CMS Medicare.gov Compare Post Acute Care list provided to:: Patient Represenative (must comment) Choice offered to / list presented to : Patient, Sibling  Admission diagnosis:  Back pain Patient Active  Problem List   Diagnosis Date Noted   Pressure injury of sacral region, stage 1 09/24/2022   Hypotension    Sepsis ([place]) 09/13/2022   Acute cholecystitis 08/19/2022   ESRD (end stage renal disease) ([place]) 08/07/2022   HFrEF (heart failure with reduced ejection fraction) ([place])    (HFpEF) heart failure with preserved ejection fraction ([place]) 07/29/2022   Hypokalemia 07/29/2022   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   Acute respiratory failure with hypoxia ([place])    B12 deficiency 07/18/2022   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/2022   Diabetic neuropathy ([place]) 05/10/2022   Elevated troponin 05/10/2022   Type 2 diabetes mellitus with hyperglycemia ([place]) 05/10/2022   Other intervertebral disc degeneration, lumbar region 02/06/2022   Chronic gout, unspecified, 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ophi) 02/06/2022   Body mass index (BMI) 35.0-35.9, adult 02/06/2022   Obesity 02/06/2022   Primary generalized (osteo)arthritis 02/06/2022   Chest pain 02/06/2022   Lumbago with sciatica, right side 08/28/2021   Weakness of both lower extremities 08/28/2021   Body mass index (BMI) 33.0-33.9, adult 08/28/2021   Constipation 02/15/2021   Abdominal pain 01/02/2021   Diabetes mellitus ([place]) 11/09/2020   Hypertensive disorder 11/09/2020   Hypercholesterolemia 11/09/2020   H/O adenomatous polyp of colon 05/05/2020   Iron deficiency anemia 05/05/2020   Chronic kidney disease, stage IV (severe) ([place]) 12/14/2019   Acute gouty arthritis 10/25/2019   Leukocytosis 04/02/2019   Cellulitis of right hand 04/01/2019   Osteoarthritis of ankle and foot 02/08/2019   CHF (congestive heart failure) ([place]) 07/05/2015   Acute congestive heart failure ([place]) 07/04/2015  Acute on chronic diastolic congestive heart failure ([place]) 07/04/2015   Mixed hyperlipidemia 07/04/2015   Anxiety state 07/04/2015   GERD (gastroesophageal reflux disease) 07/04/2015   Heart block 07/04/2015   Gastroesophageal reflux disease 57/84/6962   Diastolic CHF,  chronic (HCC) 03/11/2015   Chronic diastolic heart failure ([place]) 03/11/2015   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   Knee stiffness 01/13/2014   Gout 12/11/2013   Expected blood loss anemia 11/17/2013   S/P right TKA 11/15/2013   Anemia, normocytic normochromic 01/12/2013   Acute kidney injury superimposed on chronic kidney disease ([place]) 12/10/2012   Diabetes mellitus type 2 in obese ([place]) 12/10/2012   CAD S/P percutaneous coronary angioplasty 11/24/2012   Overweight(278.02) 11/17/2012   Chronic kidney disease, stage 3, cardiorenal syndrome 08/23/2011   Chronic kidney disease, stage 3 unspecified ([place]) 08/23/2011   Essential hypertension    PCP:  Redmond School, MD Pharmacy:   [place], [address] [place] [address] Phone: [phone] Fax: [phone]  [facility] [address] [place] [address] Phone: [phone] Fax: [phone]

## 2022-11-28 NOTE — Plan of Care (Signed)
  Problem: Acute Rehab PT Goals(only PT should resolve) Goal: Pt will Roll Supine to Side Outcome: Progressing Flowsheets (Taken 11/28/2022 1525) Pt will Roll Supine to Side:  with supervision  with min assist Goal: Pt Will Go Supine/Side To Sit Outcome: Progressing Flowsheets (Taken 11/28/2022 1525) Pt will go Supine/Side to Sit:  with supervision  with min guard assist Goal: Pt Will Go Sit To Supine/Side Outcome: Progressing Flowsheets (Taken 11/28/2022 1525) Pt will go Sit to Supine/Side:  with supervision  with min guard assist Goal: Patient Will Transfer Sit To/From Stand Outcome: Progressing Flowsheets (Taken 11/28/2022 1525) Patient will transfer sit to/from stand:  with minimal assist  with min guard assist Goal: Pt Will Transfer Bed To Chair/Chair To Bed Outcome: Progressing Flowsheets (Taken 11/28/2022 1525) Pt will Transfer Bed to Chair/Chair to Bed:  min guard assist  with min assist Goal: Pt Will Ambulate Outcome: Progressing Flowsheets (Taken 11/28/2022 1525) Pt will Ambulate:  50 feet  with supervision  with min guard assist  with rolling walker   3:26 PM, 11/28/22 [name], MPT Physical Therapist with [facility] [phone] office [phone] mobile phone

## 2022-11-29 ENCOUNTER — Emergency Department (HOSPITAL_COMMUNITY): Payer: Medicare Other

## 2022-11-29 DIAGNOSIS — N186 End stage renal disease: Secondary | ICD-10-CM | POA: Diagnosis not present

## 2022-11-29 DIAGNOSIS — R52 Pain, unspecified: Secondary | ICD-10-CM | POA: Diagnosis not present

## 2022-11-29 DIAGNOSIS — I120 Hypertensive chronic kidney disease with stage 5 chronic kidney disease or end stage renal disease: Secondary | ICD-10-CM | POA: Diagnosis not present

## 2022-11-29 DIAGNOSIS — Z992 Dependence on renal dialysis: Secondary | ICD-10-CM | POA: Diagnosis not present

## 2022-11-29 DIAGNOSIS — D631 Anemia in chronic kidney disease: Secondary | ICD-10-CM | POA: Diagnosis not present

## 2022-11-29 DIAGNOSIS — N250 Renal osteodystrophy: Secondary | ICD-10-CM | POA: Diagnosis not present

## 2022-11-29 LAB — BASIC METABOLIC PANEL
Anion gap: 8 (ref 5–15)
BUN: 24 mg/dL — ABNORMAL HIGH (ref 8–23)
CO2: 27 mmol/L (ref 22–32)
Calcium: 8.8 mg/dL — ABNORMAL LOW (ref 8.9–10.3)
Chloride: 104 mmol/L (ref 98–111)
Creatinine, Ser: 3.15 mg/dL — ABNORMAL HIGH (ref 0.61–1.24)
GFR, Estimated: 20 mL/min — ABNORMAL LOW (ref >60)
Glucose, Bld: 94 mg/dL (ref 70–99)
Potassium: 3.5 mmol/L (ref 3.5–5.1)
Sodium: 139 mmol/L (ref 135–145)

## 2022-11-29 LAB — RENAL FUNCTION PANEL
Albumin: 2 g/dL — ABNORMAL LOW (ref 3.5–5.0)
Anion gap: 9 (ref 5–15)
BUN: 23 mg/dL (ref 8–23)
CO2: 28 mmol/L (ref 22–32)
Calcium: 8.5 mg/dL — ABNORMAL LOW (ref 8.9–10.3)
Chloride: 101 mmol/L (ref 98–111)
Creatinine, Ser: 2.82 mg/dL — ABNORMAL HIGH (ref 0.61–1.24)
GFR, Estimated: 22 mL/min — ABNORMAL LOW (ref >60)
Glucose, Bld: 84 mg/dL (ref 70–99)
Phosphorus: 3.5 mg/dL (ref 2.5–4.6)
Potassium: 3.4 mmol/L — ABNORMAL LOW (ref 3.5–5.1)
Sodium: 138 mmol/L (ref 135–145)

## 2022-11-29 LAB — IRON AND TIBC
Iron: 37 ug/dL — ABNORMAL LOW (ref 45–182)
Saturation Ratios: 21 % (ref 17.9–39.5)
TIBC: 177 ug/dL — ABNORMAL LOW (ref 250–450)
UIBC: 140 ug/dL

## 2022-11-29 LAB — HEMOGLOBIN AND HEMATOCRIT, BLOOD
HCT: 22.8 % — ABNORMAL LOW (ref 39.0–52.0)
Hemoglobin: 7 g/dL — ABNORMAL LOW (ref 13.0–17.0)

## 2022-11-29 LAB — HEPATITIS B SURFACE ANTIBODY, QUANTITATIVE: Hep B S AB Quant (Post): 3.3 m[IU]/mL — ABNORMAL LOW (ref >9.9)

## 2022-11-29 MED ORDER — HEPARIN SODIUM (PORCINE) 1000 UNIT/ML IJ SOLN
INTRAMUSCULAR | Status: AC
  Administered 2022-11-29: 3800 [IU]
  Filled 2022-11-29: qty 6

## 2022-11-29 NOTE — Procedures (Signed)
   HEMODIALYSIS TREATMENT NOTE:  Uneventful 3.5 hour heparin-free treatment completed using right IJ TDC.  Catheter dressing was changed; entry site is unremarkable.  Goal met: 1.3 liters removed without interruption in UF.  All blood was returned.  Hgb resulted at 7.0 -- reported to Dr. Hollie Salk who added on iron panel.  Pt was transported back to ED to continue boarding pending SNF placement.  Pt stated, "I'm not going to rehab.  That's what my brother wants, but I'm going home with home therapy!"  Report called to Aliene Beams, RN.   Rockwell Alexandria, RN

## 2022-11-29 NOTE — Progress Notes (Signed)
Physical Therapy Treatment Patient Details Name: [name] MRN: [id number] DOB: [birth year] Today's Date: 11/29/2022   History of Present Illness [name] is a 76 y.o. male admitted on 11/28/22 with low back pain. He reports his niece, who lives with him and helps take care of him, was rolling him yesterday and he felt and heard a "pop" in his lower back and now has pain radiating down both of his legs. He is minimally ambulatory at baseline, reports that he was able to get up with a walker and take a few steps, but cannot now due to the pain.    PT Comments    Patient presents in bed eager and motivated to ambulate. Patient demonstrates labored movement for sitting up at bedside requiring mod assist to elevate trunk and move legs off EOB without c/o back pain. Patient demonstrates increased endurance/distance for hallway ambulation with RW and min assist for balance. Patient left in bed with nurse ready to take patient to dialysis. Patient will benefit from continued skilled physical therapy in hospital and recommended venue below to increase strength, balance, endurance for safe ADLs and gait.    Recommendations for follow up therapy are one component of a multi-disciplinary discharge planning process, led by the attending physician.  Recommendations may be updated based on patient status, additional functional criteria and insurance authorization.  Follow Up Recommendations  Skilled nursing-short term rehab (<3 hours/day) Can patient physically be transported by private vehicle: Yes   Assistance Recommended at Discharge Set up Supervision/Assistance  Patient can return home with the following A lot of help with walking and/or transfers;A lot of help with bathing/dressing/bathroom;Assistance with cooking/housework;Help with stairs or ramp for entrance   Equipment Recommendations  None recommended by PT    Recommendations for Other Services       Precautions / Restrictions  Precautions Precautions: Fall Restrictions Weight Bearing Restrictions: No     Mobility  Bed Mobility Overal bed mobility: Needs Assistance Bed Mobility: Supine to Sit, Sit to Supine     Supine to sit: Mod assist Sit to supine: Mod assist, Max assist   General bed mobility comments: patient able to perform sit to supine without c/o back pain with mod asssit for elevating trunk and moving legs off EOB    Transfers Overall transfer level: Needs assistance Equipment used: Rolling walker (2 wheels) Transfers: Sit to/from Stand Sit to Stand: Mod assist, Min assist           General transfer comment: patient able to stand by pushing off bed with bil UE then grabbing walker, counts to 3 before standing, min-mod assist due to bil LE weakness    Ambulation/Gait Ambulation/Gait assistance: Min assist Gait Distance (Feet): 35 Feet Assistive device: Rolling walker (2 wheels) Gait Pattern/deviations: Decreased step length - right, Decreased step length - left, Decreased stride length, Shuffle Gait velocity: decreased     General Gait Details: patient demonstrates increased endurance/distance for hallway ambulation with RW and min assist for balance. Slow labored cadence but pt eager and motivated to ambulate   Stairs             Wheelchair Mobility    Modified Rankin (Stroke Patients Only)       Balance Overall balance assessment: Needs assistance Sitting-balance support: Feet supported, No upper extremity supported Sitting balance-Leahy Scale: Fair Sitting balance - Comments: fair/good seated EOB   Standing balance support: Reliant on assistive device for balance, During functional activity, Bilateral upper extremity supported Standing balance-Leahy  Scale: Fair Standing balance comment: fair with RW                            Cognition Arousal/Alertness: Awake/alert Behavior During Therapy: WFL for tasks assessed/performed Overall Cognitive  Status: Within Functional Limits for tasks assessed                                          Exercises      General Comments        Pertinent Vitals/Pain Pain Assessment Pain Assessment: Faces Faces Pain Scale: Hurts a little bit Pain Location: low back Pain Descriptors / Indicators: Guarding, Grimacing Pain Intervention(s): Limited activity within patient's tolerance, Monitored during session, Repositioned    Home Living                          Prior Function            PT Goals (current goals can now be found in the care plan section) Acute Rehab PT Goals PT Goal Formulation: With patient Time For Goal Achievement: 12/13/22 Potential to Achieve Goals: Good Progress towards PT goals: Progressing toward goals    Frequency    Min 2X/week      PT Plan Current plan remains appropriate    Co-evaluation              AM-PAC PT "6 Clicks" Mobility   Outcome Measure  Help needed turning from your back to your side while in a flat bed without using bedrails?: A Lot Help needed moving from lying on your back to sitting on the side of a flat bed without using bedrails?: A Lot Help needed moving to and from a bed to a chair (including a wheelchair)?: A Lot Help needed standing up from a chair using your arms (e.g., wheelchair or bedside chair)?: A Little Help needed to walk in hospital room?: A Little Help needed climbing 3-5 steps with a railing? : A Lot 6 Click Score: 14    End of Session   Activity Tolerance: Patient tolerated treatment well;Patient limited by fatigue Patient left: in bed;with nursing/sitter in room (pt left in hallway ED bed with nurse taking him to dialysis) Nurse Communication: Mobility status PT Visit Diagnosis: Unsteadiness on feet (R26.81);Other abnormalities of gait and mobility (R26.89);Muscle weakness (generalized) (M62.81)     Time: 1448-1856 PT Time Calculation (min) (ACUTE ONLY): 10 min  Charges:   $Gait Training: 8-22 mins                     Zigmund Gottron, SPT

## 2022-11-29 NOTE — Progress Notes (Signed)
[facility] Progress Note   Assessment/ Plan:    [place] MWF These are orders from last hospitalization- [employer] isn't working today Center: [facility]  on [address] . EDW 89 kg HD Bath 2K/2.5Ca  Time 4 hours Heparin none. Access RIJ TDC BFR 400 DFR 800   Micera 225 mcg IVP every 2 weeks   1. Back pain/ inability to care for self at home- awaiting SNF authorization.  MRI couldn't be performed d/t spinal stimulator implant. Per EDP. 2. ESRD: Will provide HD on schedule today 3. Anemia: Hgb 8.4-->6.8 without overt losses- repeat, on max ESA as OP 4. CKD-MBD: binders/ vits 5. Nutrition: renal diet 6. Dispo: for SNF.  Subjective:    Pt is a 38M with recent back surgery who presented to [facility] after hearing a "pop" in his back.  His niece was helping him roll and perform ADLs when he heard it.  He has been unable to walk since then.  At baseline he can take a few steps with a walker.  He is boarding in the ED while he awaits SNF authorization.    He dialyzes MWF [place].  Last HD was Wednesday.  No issues with rx.    No complaints today except being hungry.     Objective:   BP (!) 112/57 (BP Location: Left Arm)   Pulse 78   Temp 97.6 F (36.4 C) (Oral)   Resp 15   Ht 6' 2"  (1.88 m)   Wt 87.1 kg   SpO2 97%   BMI 24.65 kg/m   Physical Exam: Gen:NAD, lying in bed CVS: RRR Resp: clear anteriorly Abd: soft Ext: 2+ pedal edema ACCESS: R IJ TDC  Labs: BMET Recent Labs  Lab 11/28/22 0645 11/28/22 2237  NA 137 138  K 2.8* 3.4*  CL 98 101  CO2 28 28  GLUCOSE 66* 84  BUN 19 23  CREATININE 2.32* 2.82*  CALCIUM 9.0 8.5*  PHOS  --  3.5   CBC Recent Labs  Lab 11/28/22 0645 11/28/22 2237  WBC 8.6 6.6  NEUTROABS 4.9  --   HGB 8.5* 6.8*  HCT 27.1* 22.0*  MCV 103.0* 104.3*  PLT 241 227      Medications:     acarbose  50 mg Oral TID WC   ascorbic acid  500 mg Oral Daily   aspirin EC  81 mg Oral Daily   [START ON 12/01/2022] calcitRIOL  0.25 mcg Oral Q  Sun   carvedilol  3.125 mg Oral BID WC   Chlorhexidine Gluconate Cloth  6 each Topical Q0600   cycloSPORINE  1 drop Both Eyes BID   docusate sodium  100 mg Oral BID   ferrous sulfate  325 mg Oral Daily   gabapentin  300 mg Oral QID   hydrALAZINE  25 mg Oral Q12H   insulin NPH Human  10 Units Subcutaneous QAC breakfast   isosorbide mononitrate  15 mg Oral Daily   melatonin  6 mg Oral QHS   pravastatin  40 mg Oral QPM     [name], MD 11/29/2022, 9:44 AM

## 2022-11-29 NOTE — ED Notes (Signed)
MRI staff state that neurotransmitter is not charged enough to turn to MRI mode to complete study.

## 2022-11-29 NOTE — ED Notes (Signed)
[place] that she is coming to ED to take patient up for dialysis treatment.

## 2022-11-29 NOTE — ED Notes (Signed)
ADL care performed at this time. Gown and linen changed provided. Patient repositioned off buttocks where pressure ulcer located.

## 2022-11-29 NOTE — Progress Notes (Signed)
Insurance Josem Kaufmann remains pending at this time.

## 2022-11-29 NOTE — ED Notes (Signed)
Patient states that he is not going to a SNF and wants niece called to come get him. Niece called to check on patient and informed of patient's request. States that if he wants to come home he can he will just have to come via EMS. Spoke with her about SNF placement and told her that I would contact SW and she would need to contact her.

## 2022-11-29 NOTE — ED Provider Notes (Signed)
76 year old ESRD patient on HD last HD was performed today who is currently waiting on placement.  However family have called and requested he go home with home health.  Case management has been updated about this request and patient's niece is at home waiting for him.  EMS transport will be arranged.  TOC canceling insurance authorization and notifying facility.Patient's labs noted to have an anemia hemoglobin 7.  He does not require transfusion.  I spoke with Dr. Johnney Ou The night nephrologist and said he would not have to be kept for IV iron infusion and that his facility will do weekly checks and give him IV iron as needed at dialysis sessions.  Most recent metabolic panel reassuring potassium 3.5.  Patient is adamant he does not want to go to the skilled nursing facility.  He is aware that his hemoglobin levels are low and he will need them rechecked at his next dialysis and may need iron transfusion. Discharged patient with strict return precautions and advised following up with dialysis as planned and return as needed.   Elgie Congo, MD 11/29/22 [phone]

## 2022-11-29 NOTE — Discharge Instructions (Signed)
You are free to come back to the ER at any time if you have any severe worsening pain, worsening weakness, confusion or any other symptoms concerning to you.  As we discussed, your hemoglobin's were slightly low and you will need them rechecked at your next dialysis session.  You may need IV iron transfusion.

## 2022-11-29 NOTE — Progress Notes (Signed)
TOC notified that patient no longer wishes to go to SNF.   CM followed up and called patient's niece, unable to reach by phone.  CM outreached to patient's brother and made aware of patient's wishes.  Brother reports he will speak with niece.    Brother called CM back and reports that they would like to patient to return home at this time, reports niece is at the home and waiting for patient.  Requests EMS transport as they cannot transport by private vehicle, because unable to transfer patient.    TOC will cancel insurance auth and notify facility. Bedside RN updated.

## 2022-12-02 DIAGNOSIS — N186 End stage renal disease: Secondary | ICD-10-CM | POA: Diagnosis not present

## 2022-12-02 DIAGNOSIS — Z992 Dependence on renal dialysis: Secondary | ICD-10-CM | POA: Diagnosis not present

## 2022-12-02 DIAGNOSIS — N2581 Secondary hyperparathyroidism of renal origin: Secondary | ICD-10-CM | POA: Diagnosis not present

## 2022-12-02 DIAGNOSIS — D689 Coagulation defect, unspecified: Secondary | ICD-10-CM | POA: Diagnosis not present

## 2022-12-02 DIAGNOSIS — D631 Anemia in chronic kidney disease: Secondary | ICD-10-CM | POA: Diagnosis not present

## 2022-12-02 DIAGNOSIS — E1122 Type 2 diabetes mellitus with diabetic chronic kidney disease: Secondary | ICD-10-CM | POA: Diagnosis not present

## 2022-12-04 DIAGNOSIS — E1122 Type 2 diabetes mellitus with diabetic chronic kidney disease: Secondary | ICD-10-CM | POA: Diagnosis not present

## 2022-12-04 DIAGNOSIS — N186 End stage renal disease: Secondary | ICD-10-CM | POA: Diagnosis not present

## 2022-12-04 DIAGNOSIS — D631 Anemia in chronic kidney disease: Secondary | ICD-10-CM | POA: Diagnosis not present

## 2022-12-04 DIAGNOSIS — D689 Coagulation defect, unspecified: Secondary | ICD-10-CM | POA: Diagnosis not present

## 2022-12-04 DIAGNOSIS — Z992 Dependence on renal dialysis: Secondary | ICD-10-CM | POA: Diagnosis not present

## 2022-12-04 DIAGNOSIS — N2581 Secondary hyperparathyroidism of renal origin: Secondary | ICD-10-CM | POA: Diagnosis not present

## 2022-12-05 DIAGNOSIS — M1990 Unspecified osteoarthritis, unspecified site: Secondary | ICD-10-CM | POA: Diagnosis not present

## 2022-12-05 DIAGNOSIS — Z992 Dependence on renal dialysis: Secondary | ICD-10-CM | POA: Diagnosis not present

## 2022-12-05 DIAGNOSIS — I132 Hypertensive heart and chronic kidney disease with heart failure and with stage 5 chronic kidney disease, or end stage renal disease: Secondary | ICD-10-CM | POA: Diagnosis not present

## 2022-12-05 DIAGNOSIS — E1122 Type 2 diabetes mellitus with diabetic chronic kidney disease: Secondary | ICD-10-CM | POA: Diagnosis not present

## 2022-12-05 DIAGNOSIS — L97511 Non-pressure chronic ulcer of other part of right foot limited to breakdown of skin: Secondary | ICD-10-CM | POA: Diagnosis not present

## 2022-12-05 DIAGNOSIS — I2510 Atherosclerotic heart disease of native coronary artery without angina pectoris: Secondary | ICD-10-CM | POA: Diagnosis not present

## 2022-12-05 DIAGNOSIS — N186 End stage renal disease: Secondary | ICD-10-CM | POA: Diagnosis not present

## 2022-12-05 DIAGNOSIS — Z7984 Long term (current) use of oral hypoglycemic drugs: Secondary | ICD-10-CM | POA: Diagnosis not present

## 2022-12-05 DIAGNOSIS — I5030 Unspecified diastolic (congestive) heart failure: Secondary | ICD-10-CM | POA: Diagnosis not present

## 2022-12-05 DIAGNOSIS — Z9181 History of falling: Secondary | ICD-10-CM | POA: Diagnosis not present

## 2022-12-05 DIAGNOSIS — Z6824 Body mass index (BMI) 24.0-24.9, adult: Secondary | ICD-10-CM | POA: Diagnosis not present

## 2022-12-05 DIAGNOSIS — L89152 Pressure ulcer of sacral region, stage 2: Secondary | ICD-10-CM | POA: Diagnosis not present

## 2022-12-05 DIAGNOSIS — D631 Anemia in chronic kidney disease: Secondary | ICD-10-CM | POA: Diagnosis not present

## 2022-12-05 DIAGNOSIS — E11621 Type 2 diabetes mellitus with foot ulcer: Secondary | ICD-10-CM | POA: Diagnosis not present

## 2022-12-05 DIAGNOSIS — E785 Hyperlipidemia, unspecified: Secondary | ICD-10-CM | POA: Diagnosis not present

## 2022-12-05 DIAGNOSIS — E1151 Type 2 diabetes mellitus with diabetic peripheral angiopathy without gangrene: Secondary | ICD-10-CM | POA: Diagnosis not present

## 2022-12-05 DIAGNOSIS — Z993 Dependence on wheelchair: Secondary | ICD-10-CM | POA: Diagnosis not present

## 2022-12-06 DIAGNOSIS — N2581 Secondary hyperparathyroidism of renal origin: Secondary | ICD-10-CM | POA: Diagnosis not present

## 2022-12-06 DIAGNOSIS — D689 Coagulation defect, unspecified: Secondary | ICD-10-CM | POA: Diagnosis not present

## 2022-12-06 DIAGNOSIS — E1122 Type 2 diabetes mellitus with diabetic chronic kidney disease: Secondary | ICD-10-CM | POA: Diagnosis not present

## 2022-12-06 DIAGNOSIS — Z992 Dependence on renal dialysis: Secondary | ICD-10-CM | POA: Diagnosis not present

## 2022-12-06 DIAGNOSIS — D631 Anemia in chronic kidney disease: Secondary | ICD-10-CM | POA: Diagnosis not present

## 2022-12-06 DIAGNOSIS — N186 End stage renal disease: Secondary | ICD-10-CM | POA: Diagnosis not present

## 2022-12-06 NOTE — Patient Instructions (Signed)
[name]  12/06/2022     @PREFPERIOPPHARMACY @   Your procedure is scheduled on  12/12/2022.   Report to [facility] at  0600  A.M.   Call this number if you have problems the morning of surgery:  [phone]  If you experience any cold or flu symptoms such as cough, fever, chills, shortness of breath, etc. between now and your scheduled surgery, please notify us at the above number.   Remember:  Do not eat or drink after midnight.      Your last dose of sitagliptin should be on 12/19.     DO NOT take any medications for diabetes the morning of your procedure.     Take these medicines the morning of surgery with A SIP OF WATER       allopurinol, carvedilol, gabapentin, isosorbide, meclizine, tramadol(if needed).     Do not wear jewelry, make-up or nail polish.  Do not wear lotions, powders, or perfumes, or deodorant.  Do not shave 48 hours prior to surgery.  Men may shave face and neck.  Do not bring valuables to the hospital.  [facility] is not responsible for any belongings or valuables.  Contacts, dentures or bridgework may not be worn into surgery.  Leave your suitcase in the car.  After surgery it may be brought to your room.  For patients admitted to the hospital, discharge time will be determined by your treatment team.  Patients discharged the day of surgery will not be allowed to drive home and must have someone with them for 24 hours.    Special instructions:   DO NOT smoke tobacco or vape for 24 hours before your procedure.  Please read over the following fact sheets that you were given. Coughing and Deep Breathing, Blood Transfusion Information, Surgical Site Infection Prevention, Anesthesia Post-op Instructions, and Care and Recovery After Surgery      Minimally Invasive Cholecystectomy, Care After The following information offers guidance on how to care for yourself after your procedure. Your health care provider may also give you  more specific instructions. If you have problems or questions, contact your health care provider. What can I expect after the procedure? After the procedure, it is common to have: Pain at your incision sites. You will be given medicines to control this pain. Mild nausea or vomiting. Bloating and possible shoulder pain from the gas that was used during the procedure. Follow these instructions at home: Medicines Take over-the-counter and prescription medicines only as told by your health care provider. If you were prescribed an antibiotic medicine, take it as told by your health care provider. Do not stop using the antibiotic even if you start to feel better. Ask your health care provider if the medicine prescribed to you: Requires you to avoid driving or using machinery. Can cause constipation. You may need to take these actions to prevent or treat constipation: Drink enough fluid to keep your urine pale yellow. Take over-the-counter or prescription medicines. Eat foods that are high in fiber, such as beans, whole grains, and fresh fruits and vegetables. Limit foods that are high in fat and processed sugars, such as fried or sweet foods. Incision care  Follow instructions from your health care provider about how to take care of your incisions. Make sure you: Wash your hands with soap and water for at least 20 seconds before and after you change your bandage (dressing). If soap and water are not available,  use hand sanitizer. Change your dressing as told by your health care provider. Leave stitches (sutures), skin glue, or adhesive strips in place. These skin closures may need to be in place for 2 weeks or longer. If adhesive strip edges start to loosen and curl up, you may trim the loose edges. Do not remove adhesive strips completely unless your health care provider tells you to do that. Do not take baths, swim, or use a hot tub until your health care provider approves. Ask your health care  provider if you may take showers. You may only be allowed to take sponge baths. Check your incision area every day for signs of infection. Check for: More redness, swelling, or pain. Fluid or blood. Warmth. Pus or a bad smell. Activity Rest as told by your health care provider. Do not do activities that require a lot of effort. Avoid sitting for a long time without moving. Get up to take short walks every 1-2 hours. This is important to improve blood flow and breathing. Ask for help if you feel weak or unsteady. Do not lift anything that is heavier than 10 lb (4.5 kg), or the limit that you are told, until your health care provider says that it is safe. Do not play contact sports until your health care provider approves. Do not return to work or school until your health care provider approves. Return to your normal activities as told by your health care provider. Ask your health care provider what activities are safe for you. General instructions If you were given a sedative during the procedure, it can affect you for several hours. Do not drive or operate machinery until your health care provider says that it is safe. Keep all follow-up visits. This is important. Contact a health care provider if: You develop a rash. You have more redness, swelling, or pain around your incisions. You have fluid or blood coming from your incisions. Your incisions feel warm to the touch. You have pus or a bad smell coming from your incisions. You have a fever. One or more of your incisions breaks open. Get help right away if: You have trouble breathing. You have chest pain. You have more pain in your shoulders. You faint or feel dizzy when you stand. You have severe pain in your abdomen. You have nausea or vomiting that lasts for more than one day. You have leg pain that is new or unusual, or if it is localized to one specific spot. These symptoms may represent a serious problem that is an emergency.  Do not wait to see if the symptoms will go away. Get medical help right away. Call your local emergency services (911 in the [place] Do not drive yourself to the hospital. Summary After your procedure, it is common to have pain at the incision sites. You may also have nausea or bloating. Follow your health care provider's instructions about medicine, activity restrictions, and caring for your incision areas. Do not do activities that require a lot of effort. Contact a health care provider if you have a fever or other signs of infection, such as more redness, swelling, or pain around the incisions. Get help right away if you have chest pain, increasing pain in the shoulders, or trouble breathing. This information is not intended to replace advice given to you by your health care provider. Make sure you discuss any questions you have with your health care provider. Document Revised: 06/12/2021 Document Reviewed: 06/12/2021 Elsevier Patient Education  2023 [employer] General Anesthesia, Adult, Care After The following information offers guidance on how to care for yourself after your procedure. Your health care provider may also give you more specific instructions. If you have problems or questions, contact your health care provider. What can I expect after the procedure? After the procedure, it is common for people to: Have pain or discomfort at the IV site. Have nausea or vomiting. Have a sore throat or hoarseness. Have trouble concentrating. Feel cold or chills. Feel weak, sleepy, or tired (fatigue). Have soreness and body aches. These can affect parts of the body that were not involved in surgery. Follow these instructions at home: For the time period you were told by your health care provider:  Rest. Do not participate in activities where you could fall or become injured. Do not drive or use machinery. Do not drink alcohol. Do not take sleeping pills or medicines that cause  drowsiness. Do not make important decisions or sign legal documents. Do not take care of children on your own. General instructions Drink enough fluid to keep your urine pale yellow. If you have sleep apnea, surgery and certain medicines can increase your risk for breathing problems. Follow instructions from your health care provider about wearing your sleep device: Anytime you are sleeping, including during daytime naps. While taking prescription pain medicines, sleeping medicines, or medicines that make you drowsy. Return to your normal activities as told by your health care provider. Ask your health care provider what activities are safe for you. Take over-the-counter and prescription medicines only as told by your health care provider. Do not use any products that contain nicotine or tobacco. These products include cigarettes, chewing tobacco, and vaping devices, such as e-cigarettes. These can delay incision healing after surgery. If you need help quitting, ask your health care provider. Contact a health care provider if: You have nausea or vomiting that does not get better with medicine. You vomit every time you eat or drink. You have pain that does not get better with medicine. You cannot urinate or have bloody urine. You develop a skin rash. You have a fever. Get help right away if: You have trouble breathing. You have chest pain. You vomit blood. These symptoms may be an emergency. Get help right away. Call 911. Do not wait to see if the symptoms will go away. Do not drive yourself to the hospital. Summary After the procedure, it is common to have a sore throat, hoarseness, nausea, vomiting, or to feel weak, sleepy, or fatigue. For the time period you were told by your health care provider, do not drive or use machinery. Get help right away if you have difficulty breathing, have chest pain, or vomit blood. These symptoms may be an emergency. This information is not intended to  replace advice given to you by your health care provider. Make sure you discuss any questions you have with your health care provider. Document Revised: 03/08/2022 Document Reviewed: 03/08/2022 Elsevier Patient Education  [place]. How to Use Chlorhexidine Before Surgery Chlorhexidine gluconate (CHG) is a germ-killing (antiseptic) solution that is used to clean the skin. It can get rid of the bacteria that normally live on the skin and can keep them away for about 24 hours. To clean your skin with CHG, you may be given: A CHG solution to use in the shower or as part of a sponge bath. A prepackaged cloth that contains CHG. Cleaning your skin with CHG may help lower the risk for infection:  While you are staying in the intensive care unit of the hospital. If you have a vascular access, such as a central line, to provide short-term or long-term access to your veins. If you have a catheter to drain urine from your bladder. If you are on a ventilator. A ventilator is a machine that helps you breathe by moving air in and out of your lungs. After surgery. What are the risks? Risks of using CHG include: A skin reaction. Hearing loss, if CHG gets in your ears and you have a perforated eardrum. Eye injury, if CHG gets in your eyes and is not rinsed out. The CHG product catching fire. Make sure that you avoid smoking and flames after applying CHG to your skin. Do not use CHG: If you have a chlorhexidine allergy or have previously reacted to chlorhexidine. On babies younger than 64 months of age. How to use CHG solution Use CHG only as told by your health care provider, and follow the instructions on the label. Use the full amount of CHG as directed. Usually, this is one bottle. During a shower Follow these steps when using CHG solution during a shower (unless your health care provider gives you different instructions): Start the shower. Use your normal soap and shampoo to wash your face and  hair. Turn off the shower or move out of the shower stream. Pour the CHG onto a clean washcloth. Do not use any type of brush or rough-edged sponge. Starting at your neck, lather your body down to your toes. Make sure you follow these instructions: If you will be having surgery, pay special attention to the part of your body where you will be having surgery. Scrub this area for at least 1 minute. Do not use CHG on your head or face. If the solution gets into your ears or eyes, rinse them well with water. Avoid your genital area. Avoid any areas of skin that have broken skin, cuts, or scrapes. Scrub your back and under your arms. Make sure to wash skin folds. Let the lather sit on your skin for 1-2 minutes or as long as told by your health care provider. Thoroughly rinse your entire body in the shower. Make sure that all body creases and crevices are rinsed well. Dry off with a clean towel. Do not put any substances on your body afterward--such as powder, lotion, or perfume--unless you are told to do so by your health care provider. Only use lotions that are recommended by the manufacturer. Put on clean clothes or pajamas. If it is the night before your surgery, sleep in clean sheets.  During a sponge bath Follow these steps when using CHG solution during a sponge bath (unless your health care provider gives you different instructions): Use your normal soap and shampoo to wash your face and hair. Pour the CHG onto a clean washcloth. Starting at your neck, lather your body down to your toes. Make sure you follow these instructions: If you will be having surgery, pay special attention to the part of your body where you will be having surgery. Scrub this area for at least 1 minute. Do not use CHG on your head or face. If the solution gets into your ears or eyes, rinse them well with water. Avoid your genital area. Avoid any areas of skin that have broken skin, cuts, or scrapes. Scrub your back  and under your arms. Make sure to wash skin folds. Let the lather sit on your skin for 1-2 minutes  or as long as told by your health care provider. Using a different clean, wet washcloth, thoroughly rinse your entire body. Make sure that all body creases and crevices are rinsed well. Dry off with a clean towel. Do not put any substances on your body afterward--such as powder, lotion, or perfume--unless you are told to do so by your health care provider. Only use lotions that are recommended by the manufacturer. Put on clean clothes or pajamas. If it is the night before your surgery, sleep in clean sheets. How to use CHG prepackaged cloths Only use CHG cloths as told by your health care provider, and follow the instructions on the label. Use the CHG cloth on clean, dry skin. Do not use the CHG cloth on your head or face unless your health care provider tells you to. When washing with the CHG cloth: Avoid your genital area. Avoid any areas of skin that have broken skin, cuts, or scrapes. Before surgery Follow these steps when using a CHG cloth to clean before surgery (unless your health care provider gives you different instructions): Using the CHG cloth, vigorously scrub the part of your body where you will be having surgery. Scrub using a back-and-forth motion for 3 minutes. The area on your body should be completely wet with CHG when you are done scrubbing. Do not rinse. Discard the cloth and let the area air-dry. Do not put any substances on the area afterward, such as powder, lotion, or perfume. Put on clean clothes or pajamas. If it is the night before your surgery, sleep in clean sheets.  For general bathing Follow these steps when using CHG cloths for general bathing (unless your health care provider gives you different instructions). Use a separate CHG cloth for each area of your body. Make sure you wash between any folds of skin and between your fingers and toes. Wash your body in the  following order, switching to a new cloth after each step: The front of your neck, shoulders, and chest. Both of your arms, under your arms, and your hands. Your stomach and groin area, avoiding the genitals. Your right leg and foot. Your left leg and foot. The back of your neck, your back, and your buttocks. Do not rinse. Discard the cloth and let the area air-dry. Do not put any substances on your body afterward--such as powder, lotion, or perfume--unless you are told to do so by your health care provider. Only use lotions that are recommended by the manufacturer. Put on clean clothes or pajamas. Contact a health care provider if: Your skin gets irritated after scrubbing. You have questions about using your solution or cloth. You swallow any chlorhexidine. Call your local poison control center (1-918-189-0545 in the [place] Get help right away if: Your eyes itch badly, or they become very red or swollen. Your skin itches badly and is red or swollen. Your hearing changes. You have trouble seeing. You have swelling or tingling in your mouth or throat. You have trouble breathing. These symptoms may represent a serious problem that is an emergency. Do not wait to see if the symptoms will go away. Get medical help right away. Call your local emergency services (911 in the [place] Do not drive yourself to the hospital. Summary Chlorhexidine gluconate (CHG) is a germ-killing (antiseptic) solution that is used to clean the skin. Cleaning your skin with CHG may help to lower your risk for infection. You may be given CHG to use for bathing. It may be in a  bottle or in a prepackaged cloth to use on your skin. Carefully follow your health care provider's instructions and the instructions on the product label. Do not use CHG if you have a chlorhexidine allergy. Contact your health care provider if your skin gets irritated after scrubbing. This information is not intended to replace advice given to you  by your health care provider. Make sure you discuss any questions you have with your health care provider. Document Revised: 04/08/2022 Document Reviewed: 02/19/2021 Elsevier Patient Education  [place].

## 2022-12-09 DIAGNOSIS — N186 End stage renal disease: Secondary | ICD-10-CM | POA: Diagnosis not present

## 2022-12-09 DIAGNOSIS — N2581 Secondary hyperparathyroidism of renal origin: Secondary | ICD-10-CM | POA: Diagnosis not present

## 2022-12-09 DIAGNOSIS — E1122 Type 2 diabetes mellitus with diabetic chronic kidney disease: Secondary | ICD-10-CM | POA: Diagnosis not present

## 2022-12-09 DIAGNOSIS — Z992 Dependence on renal dialysis: Secondary | ICD-10-CM | POA: Diagnosis not present

## 2022-12-09 DIAGNOSIS — D689 Coagulation defect, unspecified: Secondary | ICD-10-CM | POA: Diagnosis not present

## 2022-12-09 DIAGNOSIS — D631 Anemia in chronic kidney disease: Secondary | ICD-10-CM | POA: Diagnosis not present

## 2022-12-10 ENCOUNTER — Encounter (HOSPITAL_COMMUNITY)
Admission: RE | Admit: 2022-12-10 | Discharge: 2022-12-10 | Disposition: A | Discharge status: Home / Self Care | Payer: Medicare Other | Source: Ambulatory Visit | Attending: General Surgery | Admitting: General Surgery

## 2022-12-10 ENCOUNTER — Encounter (HOSPITAL_COMMUNITY): Payer: Self-pay

## 2022-12-10 VITALS — BP 100/48 | HR 66 | Temp 97.9°F | Resp 18 | Ht 74.0 in | Wt 192.0 lb

## 2022-12-10 DIAGNOSIS — M1990 Unspecified osteoarthritis, unspecified site: Secondary | ICD-10-CM | POA: Diagnosis not present

## 2022-12-10 DIAGNOSIS — E669 Obesity, unspecified: Secondary | ICD-10-CM | POA: Diagnosis present

## 2022-12-10 DIAGNOSIS — Z01812 Encounter for preprocedural laboratory examination: Secondary | ICD-10-CM | POA: Insufficient documentation

## 2022-12-10 DIAGNOSIS — K559 Vascular disorder of intestine, unspecified: Secondary | ICD-10-CM | POA: Diagnosis not present

## 2022-12-10 DIAGNOSIS — E1122 Type 2 diabetes mellitus with diabetic chronic kidney disease: Secondary | ICD-10-CM | POA: Diagnosis not present

## 2022-12-10 DIAGNOSIS — L0231 Cutaneous abscess of buttock: Secondary | ICD-10-CM | POA: Diagnosis not present

## 2022-12-10 DIAGNOSIS — E43 Unspecified severe protein-calorie malnutrition: Secondary | ICD-10-CM | POA: Diagnosis not present

## 2022-12-10 DIAGNOSIS — I472 Ventricular tachycardia, unspecified: Secondary | ICD-10-CM | POA: Diagnosis not present

## 2022-12-10 DIAGNOSIS — R410 Disorientation, unspecified: Secondary | ICD-10-CM | POA: Diagnosis not present

## 2022-12-10 DIAGNOSIS — E1169 Type 2 diabetes mellitus with other specified complication: Secondary | ICD-10-CM | POA: Insufficient documentation

## 2022-12-10 DIAGNOSIS — E871 Hypo-osmolality and hyponatremia: Secondary | ICD-10-CM | POA: Diagnosis not present

## 2022-12-10 DIAGNOSIS — N2581 Secondary hyperparathyroidism of renal origin: Secondary | ICD-10-CM | POA: Diagnosis not present

## 2022-12-10 DIAGNOSIS — Z515 Encounter for palliative care: Secondary | ICD-10-CM | POA: Diagnosis not present

## 2022-12-10 DIAGNOSIS — R509 Fever, unspecified: Secondary | ICD-10-CM | POA: Diagnosis not present

## 2022-12-10 DIAGNOSIS — N281 Cyst of kidney, acquired: Secondary | ICD-10-CM | POA: Diagnosis not present

## 2022-12-10 DIAGNOSIS — Z7401 Bed confinement status: Secondary | ICD-10-CM | POA: Diagnosis not present

## 2022-12-10 DIAGNOSIS — K812 Acute cholecystitis with chronic cholecystitis: Secondary | ICD-10-CM | POA: Diagnosis not present

## 2022-12-10 DIAGNOSIS — K8012 Calculus of gallbladder with acute and chronic cholecystitis without obstruction: Secondary | ICD-10-CM | POA: Diagnosis not present

## 2022-12-10 DIAGNOSIS — K8010 Calculus of gallbladder with chronic cholecystitis without obstruction: Secondary | ICD-10-CM | POA: Diagnosis not present

## 2022-12-10 DIAGNOSIS — R531 Weakness: Secondary | ICD-10-CM | POA: Diagnosis not present

## 2022-12-10 DIAGNOSIS — I5030 Unspecified diastolic (congestive) heart failure: Secondary | ICD-10-CM | POA: Diagnosis not present

## 2022-12-10 DIAGNOSIS — L89159 Pressure ulcer of sacral region, unspecified stage: Secondary | ICD-10-CM | POA: Diagnosis not present

## 2022-12-10 DIAGNOSIS — E782 Mixed hyperlipidemia: Secondary | ICD-10-CM | POA: Diagnosis not present

## 2022-12-10 DIAGNOSIS — Z7984 Long term (current) use of oral hypoglycemic drugs: Secondary | ICD-10-CM | POA: Diagnosis not present

## 2022-12-10 DIAGNOSIS — K82A1 Gangrene of gallbladder in cholecystitis: Secondary | ICD-10-CM | POA: Diagnosis not present

## 2022-12-10 DIAGNOSIS — Z1611 Resistance to penicillins: Secondary | ICD-10-CM | POA: Diagnosis not present

## 2022-12-10 DIAGNOSIS — I509 Heart failure, unspecified: Secondary | ICD-10-CM | POA: Diagnosis not present

## 2022-12-10 DIAGNOSIS — L97511 Non-pressure chronic ulcer of other part of right foot limited to breakdown of skin: Secondary | ICD-10-CM | POA: Diagnosis not present

## 2022-12-10 DIAGNOSIS — N186 End stage renal disease: Secondary | ICD-10-CM | POA: Insufficient documentation

## 2022-12-10 DIAGNOSIS — I480 Paroxysmal atrial fibrillation: Secondary | ICD-10-CM | POA: Diagnosis not present

## 2022-12-10 DIAGNOSIS — K8066 Calculus of gallbladder and bile duct with acute and chronic cholecystitis without obstruction: Secondary | ICD-10-CM | POA: Diagnosis not present

## 2022-12-10 DIAGNOSIS — L89150 Pressure ulcer of sacral region, unstageable: Secondary | ICD-10-CM | POA: Diagnosis not present

## 2022-12-10 DIAGNOSIS — Z7189 Other specified counseling: Secondary | ICD-10-CM | POA: Diagnosis not present

## 2022-12-10 DIAGNOSIS — R630 Anorexia: Secondary | ICD-10-CM | POA: Diagnosis not present

## 2022-12-10 DIAGNOSIS — E1140 Type 2 diabetes mellitus with diabetic neuropathy, unspecified: Secondary | ICD-10-CM | POA: Diagnosis not present

## 2022-12-10 DIAGNOSIS — L89152 Pressure ulcer of sacral region, stage 2: Secondary | ICD-10-CM | POA: Diagnosis not present

## 2022-12-10 DIAGNOSIS — Z48815 Encounter for surgical aftercare following surgery on the digestive system: Secondary | ICD-10-CM | POA: Diagnosis not present

## 2022-12-10 DIAGNOSIS — K811 Chronic cholecystitis: Secondary | ICD-10-CM | POA: Diagnosis not present

## 2022-12-10 DIAGNOSIS — I2510 Atherosclerotic heart disease of native coronary artery without angina pectoris: Secondary | ICD-10-CM | POA: Diagnosis not present

## 2022-12-10 DIAGNOSIS — I959 Hypotension, unspecified: Secondary | ICD-10-CM | POA: Diagnosis not present

## 2022-12-10 DIAGNOSIS — E440 Moderate protein-calorie malnutrition: Secondary | ICD-10-CM | POA: Diagnosis not present

## 2022-12-10 DIAGNOSIS — I132 Hypertensive heart and chronic kidney disease with heart failure and with stage 5 chronic kidney disease, or end stage renal disease: Secondary | ICD-10-CM | POA: Diagnosis not present

## 2022-12-10 DIAGNOSIS — E876 Hypokalemia: Secondary | ICD-10-CM | POA: Diagnosis not present

## 2022-12-10 DIAGNOSIS — F418 Other specified anxiety disorders: Secondary | ICD-10-CM | POA: Diagnosis not present

## 2022-12-10 DIAGNOSIS — A419 Sepsis, unspecified organism: Secondary | ICD-10-CM | POA: Diagnosis not present

## 2022-12-10 DIAGNOSIS — D631 Anemia in chronic kidney disease: Secondary | ICD-10-CM | POA: Diagnosis not present

## 2022-12-10 DIAGNOSIS — I5042 Chronic combined systolic (congestive) and diastolic (congestive) heart failure: Secondary | ICD-10-CM | POA: Diagnosis not present

## 2022-12-10 DIAGNOSIS — E119 Type 2 diabetes mellitus without complications: Secondary | ICD-10-CM | POA: Diagnosis not present

## 2022-12-10 DIAGNOSIS — R110 Nausea: Secondary | ICD-10-CM | POA: Diagnosis not present

## 2022-12-10 DIAGNOSIS — E1151 Type 2 diabetes mellitus with diabetic peripheral angiopathy without gangrene: Secondary | ICD-10-CM | POA: Diagnosis not present

## 2022-12-10 DIAGNOSIS — D689 Coagulation defect, unspecified: Secondary | ICD-10-CM | POA: Diagnosis not present

## 2022-12-10 DIAGNOSIS — R9431 Abnormal electrocardiogram [ECG] [EKG]: Secondary | ICD-10-CM | POA: Diagnosis not present

## 2022-12-10 DIAGNOSIS — Z01818 Encounter for other preprocedural examination: Secondary | ICD-10-CM

## 2022-12-10 DIAGNOSIS — Z993 Dependence on wheelchair: Secondary | ICD-10-CM | POA: Diagnosis not present

## 2022-12-10 DIAGNOSIS — K810 Acute cholecystitis: Secondary | ICD-10-CM | POA: Diagnosis not present

## 2022-12-10 DIAGNOSIS — K819 Cholecystitis, unspecified: Secondary | ICD-10-CM | POA: Diagnosis not present

## 2022-12-10 DIAGNOSIS — R532 Functional quadriplegia: Secondary | ICD-10-CM | POA: Diagnosis not present

## 2022-12-10 DIAGNOSIS — Z4682 Encounter for fitting and adjustment of non-vascular catheter: Secondary | ICD-10-CM | POA: Diagnosis not present

## 2022-12-10 DIAGNOSIS — I429 Cardiomyopathy, unspecified: Secondary | ICD-10-CM | POA: Diagnosis not present

## 2022-12-10 DIAGNOSIS — Z9181 History of falling: Secondary | ICD-10-CM | POA: Diagnosis not present

## 2022-12-10 DIAGNOSIS — E11621 Type 2 diabetes mellitus with foot ulcer: Secondary | ICD-10-CM | POA: Diagnosis not present

## 2022-12-10 DIAGNOSIS — K5150 Left sided colitis without complications: Secondary | ICD-10-CM | POA: Diagnosis not present

## 2022-12-10 DIAGNOSIS — L89153 Pressure ulcer of sacral region, stage 3: Secondary | ICD-10-CM | POA: Diagnosis not present

## 2022-12-10 DIAGNOSIS — Z87891 Personal history of nicotine dependence: Secondary | ICD-10-CM | POA: Diagnosis not present

## 2022-12-10 DIAGNOSIS — D649 Anemia, unspecified: Secondary | ICD-10-CM | POA: Diagnosis not present

## 2022-12-10 DIAGNOSIS — L89154 Pressure ulcer of sacral region, stage 4: Secondary | ICD-10-CM | POA: Diagnosis not present

## 2022-12-10 DIAGNOSIS — Z992 Dependence on renal dialysis: Secondary | ICD-10-CM | POA: Diagnosis not present

## 2022-12-10 DIAGNOSIS — I5022 Chronic systolic (congestive) heart failure: Secondary | ICD-10-CM | POA: Diagnosis not present

## 2022-12-10 DIAGNOSIS — E785 Hyperlipidemia, unspecified: Secondary | ICD-10-CM | POA: Diagnosis not present

## 2022-12-10 DIAGNOSIS — I4891 Unspecified atrial fibrillation: Secondary | ICD-10-CM | POA: Diagnosis not present

## 2022-12-10 DIAGNOSIS — Z6824 Body mass index (BMI) 24.0-24.9, adult: Secondary | ICD-10-CM | POA: Diagnosis not present

## 2022-12-10 DIAGNOSIS — I10 Essential (primary) hypertension: Secondary | ICD-10-CM | POA: Diagnosis not present

## 2022-12-10 DIAGNOSIS — I5020 Unspecified systolic (congestive) heart failure: Secondary | ICD-10-CM | POA: Diagnosis not present

## 2022-12-10 HISTORY — DX: Presence of other specified functional implants: Z96.89

## 2022-12-10 LAB — SURGICAL PCR SCREEN
MRSA, PCR: NEGATIVE
Staphylococcus aureus: NEGATIVE

## 2022-12-11 DIAGNOSIS — E1122 Type 2 diabetes mellitus with diabetic chronic kidney disease: Secondary | ICD-10-CM | POA: Diagnosis not present

## 2022-12-11 DIAGNOSIS — N2581 Secondary hyperparathyroidism of renal origin: Secondary | ICD-10-CM | POA: Diagnosis not present

## 2022-12-11 DIAGNOSIS — N186 End stage renal disease: Secondary | ICD-10-CM | POA: Diagnosis not present

## 2022-12-11 DIAGNOSIS — Z992 Dependence on renal dialysis: Secondary | ICD-10-CM | POA: Diagnosis not present

## 2022-12-11 DIAGNOSIS — D689 Coagulation defect, unspecified: Secondary | ICD-10-CM | POA: Diagnosis not present

## 2022-12-11 DIAGNOSIS — D631 Anemia in chronic kidney disease: Secondary | ICD-10-CM | POA: Diagnosis not present

## 2022-12-11 LAB — HEMOGLOBIN A1C
Hgb A1c MFr Bld: 4.4 % — ABNORMAL LOW (ref 4.8–5.6)
Mean Plasma Glucose: 80 mg/dL

## 2022-12-12 ENCOUNTER — Encounter (HOSPITAL_COMMUNITY): Payer: Self-pay | Admitting: Certified Registered Nurse Anesthetist

## 2022-12-12 ENCOUNTER — Encounter (HOSPITAL_COMMUNITY)
Admission: RE | Disposition: A | Discharge status: Skilled Nursing Facility | Payer: Self-pay | Source: Home / Self Care | Attending: Family Medicine

## 2022-12-12 ENCOUNTER — Encounter (HOSPITAL_COMMUNITY): Payer: Self-pay | Admitting: Certified Registered"

## 2022-12-12 ENCOUNTER — Encounter (HOSPITAL_COMMUNITY): Payer: Self-pay | Admitting: General Surgery

## 2022-12-12 ENCOUNTER — Observation Stay (HOSPITAL_COMMUNITY): Payer: Medicare Other

## 2022-12-12 ENCOUNTER — Inpatient Hospital Stay (HOSPITAL_COMMUNITY)
Admission: RE | Admit: 2022-12-12 | Discharge: 2023-01-21 | DRG: 417 | Disposition: A | Discharge status: Skilled Nursing Facility | Payer: Medicare Other | Attending: Family Medicine | Admitting: Family Medicine

## 2022-12-12 ENCOUNTER — Other Ambulatory Visit: Payer: Self-pay

## 2022-12-12 ENCOUNTER — Other Ambulatory Visit (HOSPITAL_COMMUNITY): Payer: Self-pay | Admitting: *Deleted

## 2022-12-12 DIAGNOSIS — R509 Fever, unspecified: Secondary | ICD-10-CM | POA: Diagnosis not present

## 2022-12-12 DIAGNOSIS — I429 Cardiomyopathy, unspecified: Secondary | ICD-10-CM | POA: Diagnosis present

## 2022-12-12 DIAGNOSIS — K811 Chronic cholecystitis: Secondary | ICD-10-CM | POA: Diagnosis present

## 2022-12-12 DIAGNOSIS — Z992 Dependence on renal dialysis: Secondary | ICD-10-CM

## 2022-12-12 DIAGNOSIS — E1140 Type 2 diabetes mellitus with diabetic neuropathy, unspecified: Secondary | ICD-10-CM | POA: Diagnosis present

## 2022-12-12 DIAGNOSIS — K810 Acute cholecystitis: Secondary | ICD-10-CM | POA: Diagnosis not present

## 2022-12-12 DIAGNOSIS — L89153 Pressure ulcer of sacral region, stage 3: Secondary | ICD-10-CM

## 2022-12-12 DIAGNOSIS — I2510 Atherosclerotic heart disease of native coronary artery without angina pectoris: Secondary | ICD-10-CM | POA: Diagnosis present

## 2022-12-12 DIAGNOSIS — K5900 Constipation, unspecified: Secondary | ICD-10-CM | POA: Diagnosis present

## 2022-12-12 DIAGNOSIS — Z86010 Personal history of colonic polyps: Secondary | ICD-10-CM

## 2022-12-12 DIAGNOSIS — E1122 Type 2 diabetes mellitus with diabetic chronic kidney disease: Secondary | ICD-10-CM | POA: Diagnosis not present

## 2022-12-12 DIAGNOSIS — N186 End stage renal disease: Secondary | ICD-10-CM | POA: Diagnosis present

## 2022-12-12 DIAGNOSIS — K8012 Calculus of gallbladder with acute and chronic cholecystitis without obstruction: Principal | ICD-10-CM | POA: Diagnosis present

## 2022-12-12 DIAGNOSIS — R531 Weakness: Secondary | ICD-10-CM

## 2022-12-12 DIAGNOSIS — R9431 Abnormal electrocardiogram [ECG] [EKG]: Secondary | ICD-10-CM | POA: Diagnosis not present

## 2022-12-12 DIAGNOSIS — I480 Paroxysmal atrial fibrillation: Secondary | ICD-10-CM

## 2022-12-12 DIAGNOSIS — E876 Hypokalemia: Secondary | ICD-10-CM | POA: Diagnosis present

## 2022-12-12 DIAGNOSIS — Z794 Long term (current) use of insulin: Secondary | ICD-10-CM

## 2022-12-12 DIAGNOSIS — Z539 Procedure and treatment not carried out, unspecified reason: Secondary | ICD-10-CM | POA: Diagnosis present

## 2022-12-12 DIAGNOSIS — I5042 Chronic combined systolic (congestive) and diastolic (congestive) heart failure: Secondary | ICD-10-CM | POA: Diagnosis present

## 2022-12-12 DIAGNOSIS — B964 Proteus (mirabilis) (morganii) as the cause of diseases classified elsewhere: Secondary | ICD-10-CM | POA: Diagnosis present

## 2022-12-12 DIAGNOSIS — K559 Vascular disorder of intestine, unspecified: Secondary | ICD-10-CM | POA: Diagnosis present

## 2022-12-12 DIAGNOSIS — E1169 Type 2 diabetes mellitus with other specified complication: Secondary | ICD-10-CM | POA: Diagnosis not present

## 2022-12-12 DIAGNOSIS — Z808 Family history of malignant neoplasm of other organs or systems: Secondary | ICD-10-CM

## 2022-12-12 DIAGNOSIS — I472 Ventricular tachycardia, unspecified: Secondary | ICD-10-CM | POA: Diagnosis present

## 2022-12-12 DIAGNOSIS — I252 Old myocardial infarction: Secondary | ICD-10-CM

## 2022-12-12 DIAGNOSIS — L89150 Pressure ulcer of sacral region, unstageable: Principal | ICD-10-CM | POA: Diagnosis present

## 2022-12-12 DIAGNOSIS — R627 Adult failure to thrive: Secondary | ICD-10-CM | POA: Diagnosis present

## 2022-12-12 DIAGNOSIS — R532 Functional quadriplegia: Secondary | ICD-10-CM | POA: Diagnosis present

## 2022-12-12 DIAGNOSIS — E871 Hypo-osmolality and hyponatremia: Secondary | ICD-10-CM | POA: Diagnosis not present

## 2022-12-12 DIAGNOSIS — M1A9XX1 Chronic gout, unspecified, 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ophi): Secondary | ICD-10-CM | POA: Diagnosis present

## 2022-12-12 DIAGNOSIS — R001 Bradycardia, unspecified: Secondary | ICD-10-CM | POA: Diagnosis present

## 2022-12-12 DIAGNOSIS — L89159 Pressure ulcer of sacral region, unspecified stage: Secondary | ICD-10-CM | POA: Diagnosis present

## 2022-12-12 DIAGNOSIS — Z89012 Acquired absence of left thumb: Secondary | ICD-10-CM

## 2022-12-12 DIAGNOSIS — E119 Type 2 diabetes mellitus without complications: Secondary | ICD-10-CM | POA: Diagnosis present

## 2022-12-12 DIAGNOSIS — E440 Moderate protein-calorie malnutrition: Secondary | ICD-10-CM | POA: Insufficient documentation

## 2022-12-12 DIAGNOSIS — L89154 Pressure ulcer of sacral region, stage 4: Secondary | ICD-10-CM | POA: Diagnosis not present

## 2022-12-12 DIAGNOSIS — Z87440 Personal history of urinary (tract) infections: Secondary | ICD-10-CM

## 2022-12-12 DIAGNOSIS — K648 Other hemorrhoids: Secondary | ICD-10-CM | POA: Diagnosis present

## 2022-12-12 DIAGNOSIS — M150 Primary generalized (osteo)arthritis: Secondary | ICD-10-CM | POA: Diagnosis present

## 2022-12-12 DIAGNOSIS — R54 Age-related physical debility: Secondary | ICD-10-CM | POA: Diagnosis present

## 2022-12-12 DIAGNOSIS — Z833 Family history of diabetes mellitus: Secondary | ICD-10-CM

## 2022-12-12 DIAGNOSIS — Z01810 Encounter for preprocedural cardiovascular examination: Secondary | ICD-10-CM

## 2022-12-12 DIAGNOSIS — I5022 Chronic systolic (congestive) heart failure: Secondary | ICD-10-CM | POA: Diagnosis present

## 2022-12-12 DIAGNOSIS — Z634 Disappearance and death of family member: Secondary | ICD-10-CM

## 2022-12-12 DIAGNOSIS — L0231 Cutaneous abscess of buttock: Secondary | ICD-10-CM | POA: Diagnosis present

## 2022-12-12 DIAGNOSIS — D539 Nutritional anemia, unspecified: Secondary | ICD-10-CM | POA: Diagnosis present

## 2022-12-12 DIAGNOSIS — Z886 Allergy status to analgesic agent status: Secondary | ICD-10-CM

## 2022-12-12 DIAGNOSIS — I440 Atrioventricular block, first degree: Secondary | ICD-10-CM | POA: Diagnosis present

## 2022-12-12 DIAGNOSIS — I10 Essential (primary) hypertension: Secondary | ICD-10-CM | POA: Diagnosis present

## 2022-12-12 DIAGNOSIS — Z96653 Presence of artificial knee joint, bilateral: Secondary | ICD-10-CM | POA: Diagnosis present

## 2022-12-12 DIAGNOSIS — B952 Enterococcus as the cause of diseases classified elsewhere: Secondary | ICD-10-CM | POA: Diagnosis present

## 2022-12-12 DIAGNOSIS — Z8249 Family history of ischemic heart disease and other diseases of the circulatory system: Secondary | ICD-10-CM

## 2022-12-12 DIAGNOSIS — Z955 Presence of coronary angioplasty implant and graft: Secondary | ICD-10-CM

## 2022-12-12 DIAGNOSIS — E669 Obesity, unspecified: Secondary | ICD-10-CM | POA: Diagnosis present

## 2022-12-12 DIAGNOSIS — E43 Unspecified severe protein-calorie malnutrition: Secondary | ICD-10-CM | POA: Diagnosis not present

## 2022-12-12 DIAGNOSIS — B9620 Unspecified Escherichia coli [E. coli] as the cause of diseases classified elsewhere: Secondary | ICD-10-CM | POA: Diagnosis present

## 2022-12-12 DIAGNOSIS — D631 Anemia in chronic kidney disease: Secondary | ICD-10-CM | POA: Diagnosis present

## 2022-12-12 DIAGNOSIS — D649 Anemia, unspecified: Secondary | ICD-10-CM | POA: Diagnosis present

## 2022-12-12 DIAGNOSIS — K219 Gastro-esophageal reflux disease without esophagitis: Secondary | ICD-10-CM | POA: Diagnosis present

## 2022-12-12 DIAGNOSIS — R112 Nausea with vomiting, unspecified: Secondary | ICD-10-CM | POA: Diagnosis present

## 2022-12-12 DIAGNOSIS — I132 Hypertensive heart and chronic kidney disease with heart failure and with stage 5 chronic kidney disease, or end stage renal disease: Secondary | ICD-10-CM | POA: Diagnosis present

## 2022-12-12 DIAGNOSIS — I5020 Unspecified systolic (congestive) heart failure: Secondary | ICD-10-CM | POA: Diagnosis present

## 2022-12-12 DIAGNOSIS — Z888 Allergy status to other drugs, medicaments and biological substances status: Secondary | ICD-10-CM

## 2022-12-12 DIAGNOSIS — Z6822 Body mass index (BMI) 22.0-22.9, adult: Secondary | ICD-10-CM

## 2022-12-12 DIAGNOSIS — R159 Full incontinence of feces: Secondary | ICD-10-CM | POA: Diagnosis not present

## 2022-12-12 DIAGNOSIS — N529 Male erectile dysfunction, unspecified: Secondary | ICD-10-CM | POA: Diagnosis present

## 2022-12-12 DIAGNOSIS — Z7401 Bed confinement status: Secondary | ICD-10-CM

## 2022-12-12 DIAGNOSIS — E875 Hyperkalemia: Secondary | ICD-10-CM | POA: Diagnosis present

## 2022-12-12 DIAGNOSIS — K5150 Left sided colitis without complications: Secondary | ICD-10-CM | POA: Diagnosis present

## 2022-12-12 DIAGNOSIS — I953 Hypotension of hemodialysis: Secondary | ICD-10-CM | POA: Diagnosis not present

## 2022-12-12 DIAGNOSIS — N400 Enlarged prostate without lower urinary tract symptoms: Secondary | ICD-10-CM | POA: Diagnosis present

## 2022-12-12 DIAGNOSIS — Z7982 Long term (current) use of aspirin: Secondary | ICD-10-CM

## 2022-12-12 DIAGNOSIS — I459 Conduction disorder, unspecified: Secondary | ICD-10-CM | POA: Diagnosis present

## 2022-12-12 DIAGNOSIS — K660 Peritoneal adhesions (postprocedural) (postinfection): Secondary | ICD-10-CM | POA: Diagnosis present

## 2022-12-12 DIAGNOSIS — E782 Mixed hyperlipidemia: Secondary | ICD-10-CM | POA: Diagnosis present

## 2022-12-12 DIAGNOSIS — Z87891 Personal history of nicotine dependence: Secondary | ICD-10-CM

## 2022-12-12 DIAGNOSIS — Z79899 Other long term (current) drug therapy: Secondary | ICD-10-CM

## 2022-12-12 DIAGNOSIS — Z1611 Resistance to penicillins: Secondary | ICD-10-CM | POA: Diagnosis present

## 2022-12-12 HISTORY — DX: Cardiomyopathy, unspecified: I42.9

## 2022-12-12 HISTORY — DX: End stage renal disease: N18.6

## 2022-12-12 HISTORY — DX: Paroxysmal atrial fibrillation: I48.0

## 2022-12-12 LAB — CBC
HCT: 27.9 % — ABNORMAL LOW (ref 39.0–52.0)
Hemoglobin: 8.5 g/dL — ABNORMAL LOW (ref 13.0–17.0)
MCH: 32.3 pg (ref 26.0–34.0)
MCHC: 30.5 g/dL (ref 30.0–36.0)
MCV: 106.1 fL — ABNORMAL HIGH (ref 80.0–100.0)
Platelets: 231 10*3/uL (ref 150–400)
RBC: 2.63 MIL/uL — ABNORMAL LOW (ref 4.22–5.81)
RDW: 16.8 % — ABNORMAL HIGH (ref 11.5–15.5)
WBC: 10.4 10*3/uL (ref 4.0–10.5)
nRBC: 0 % (ref 0.0–0.2)

## 2022-12-12 LAB — BASIC METABOLIC PANEL
Anion gap: 13 (ref 5–15)
BUN: 13 mg/dL (ref 8–23)
CO2: 29 mmol/L (ref 22–32)
Calcium: 9.3 mg/dL (ref 8.9–10.3)
Chloride: 96 mmol/L — ABNORMAL LOW (ref 98–111)
Creatinine, Ser: 3.09 mg/dL — ABNORMAL HIGH (ref 0.61–1.24)
GFR, Estimated: 20 mL/min — ABNORMAL LOW (ref >60)
Glucose, Bld: 119 mg/dL — ABNORMAL HIGH (ref 70–99)
Potassium: 2.8 mmol/L — ABNORMAL LOW (ref 3.5–5.1)
Sodium: 138 mmol/L (ref 135–145)

## 2022-12-12 LAB — GLUCOSE, CAPILLARY
Glucose-Capillary: 92 mg/dL (ref 70–99)
Glucose-Capillary: 127 mg/dL — ABNORMAL HIGH (ref 70–99)
Glucose-Capillary: 173 mg/dL — ABNORMAL HIGH (ref 70–99)

## 2022-12-12 LAB — ECHOCARDIOGRAM LIMITED
Area-P 1/2: 3.91 cm2
Calc EF: 50.1 %
S' Lateral: 4.3 cm
Single Plane A2C EF: 44.7 %
Single Plane A4C EF: 52.9 %

## 2022-12-12 LAB — HEMOGLOBIN AND HEMATOCRIT, BLOOD
HCT: 27.8 % — ABNORMAL LOW (ref 39.0–52.0)
Hemoglobin: 8.6 g/dL — ABNORMAL LOW (ref 13.0–17.0)

## 2022-12-12 LAB — MAGNESIUM: Magnesium: 1.8 mg/dL (ref 1.7–2.4)

## 2022-12-12 SURGERY — CHOLECYSTECTOMY, ROBOT-ASSISTED, LAPAROSCOPIC
Anesthesia: General

## 2022-12-12 MED ORDER — ESMOLOL HCL 100 MG/10ML IV SOLN
INTRAVENOUS | Status: AC
  Filled 2022-12-12: qty 10

## 2022-12-12 MED ORDER — ENOXAPARIN SODIUM 30 MG/0.3ML IJ SOSY: 30.0000 mg | PREFILLED_SYRINGE | INTRAMUSCULAR | Status: DC

## 2022-12-12 MED ORDER — INDOCYANINE GREEN 25 MG IV SOLR
25.0000 mg | Freq: Once | INTRAVENOUS | Status: DC
  Filled 2022-12-12: qty 10

## 2022-12-12 MED ORDER — CYCLOSPORINE 0.05 % OP EMUL
1.0000 [drp] | Freq: Two times a day (BID) | OPHTHALMIC | Status: DC
  Administered 2022-12-12 – 2023-01-21 (×74): 1 [drp] via OPHTHALMIC
  Filled 2022-12-12 (×85): qty 30

## 2022-12-12 MED ORDER — GLYCOPYRROLATE PF 0.2 MG/ML IJ SOSY
PREFILLED_SYRINGE | INTRAMUSCULAR | Status: AC
  Filled 2022-12-12: qty 1

## 2022-12-12 MED ORDER — SEVOFLURANE IN SOLN
RESPIRATORY_TRACT | Status: AC
  Filled 2022-12-12: qty 250

## 2022-12-12 MED ORDER — GABAPENTIN 300 MG PO CAPS
300.0000 mg | ORAL_CAPSULE | Freq: Four times a day (QID) | ORAL | Status: DC
  Administered 2022-12-12 – 2022-12-26 (×42): 300 mg via ORAL
  Filled 2022-12-12 (×45): qty 1

## 2022-12-12 MED ORDER — POTASSIUM CHLORIDE 10 MEQ/100ML IV SOLN
10.0000 meq | INTRAVENOUS | Status: AC
  Filled 2022-12-12 (×4): qty 100

## 2022-12-12 MED ORDER — CHLORHEXIDINE GLUCONATE CLOTH 2 % EX PADS
6.0000 | MEDICATED_PAD | Freq: Once | CUTANEOUS | Status: AC
  Administered 2022-12-12: 6 via TOPICAL

## 2022-12-12 MED ORDER — INSULIN ASPART 100 UNIT/ML IJ SOLN
0.0000 [IU] | Freq: Three times a day (TID) | INTRAMUSCULAR | Status: DC
  Administered 2022-12-12 – 2022-12-14 (×2): 1 [IU] via SUBCUTANEOUS
  Filled 2022-12-12: qty 0.09

## 2022-12-12 MED ORDER — SODIUM CHLORIDE 0.9 % IV SOLN
2.0000 g | INTRAVENOUS | Status: DC
  Filled 2022-12-12: qty 2

## 2022-12-12 MED ORDER — INDOCYANINE GREEN 25 MG IV SOLR
INTRAVENOUS | Status: AC
  Administered 2022-12-12: 2.5 mg via TOPICAL
  Filled 2022-12-12: qty 10

## 2022-12-12 MED ORDER — POLYVINYL ALCOHOL 1.4 % OP SOLN: 1.0000 [drp] | Freq: Every day | OPHTHALMIC | Status: DC | PRN

## 2022-12-12 MED ORDER — ALLOPURINOL 100 MG PO TABS
100.0000 mg | ORAL_TABLET | Freq: Every day | ORAL | Status: DC
  Administered 2022-12-14 – 2023-01-21 (×38): 100 mg via ORAL
  Filled 2022-12-12 (×40): qty 1

## 2022-12-12 MED ORDER — FENTANYL CITRATE (PF) 250 MCG/5ML IJ SOLN
INTRAMUSCULAR | Status: AC
  Filled 2022-12-12: qty 5

## 2022-12-12 MED ORDER — INDOCYANINE GREEN 25 MG IV SOLR
2.5000 mg | Freq: Once | INTRAVENOUS | Status: AC
  Filled 2022-12-12: qty 10

## 2022-12-12 MED ORDER — INSULIN ASPART 100 UNIT/ML IJ SOLN
0.0000 [IU] | Freq: Every day | INTRAMUSCULAR | Status: DC
  Filled 2022-12-12: qty 0.05

## 2022-12-12 MED ORDER — ONDANSETRON HCL 4 MG/2ML IJ SOLN
INTRAMUSCULAR | Status: AC
  Filled 2022-12-12: qty 2

## 2022-12-12 MED ORDER — TRAMADOL HCL 50 MG PO TABS
50.0000 mg | ORAL_TABLET | Freq: Four times a day (QID) | ORAL | Status: DC | PRN
  Administered 2022-12-15 – 2023-01-17 (×12): 50 mg via ORAL
  Filled 2022-12-12 (×12): qty 1

## 2022-12-12 MED ORDER — ONDANSETRON HCL 4 MG/2ML IJ SOLN
4.0000 mg | Freq: Once | INTRAMUSCULAR | Status: AC
  Administered 2022-12-12: 4 mg via INTRAVENOUS

## 2022-12-12 MED ORDER — BUPIVACAINE LIPOSOME 1.3 % IJ SUSP
INTRAMUSCULAR | Status: AC
  Filled 2022-12-12: qty 20

## 2022-12-12 MED ORDER — ACETAMINOPHEN 325 MG PO TABS
650.0000 mg | ORAL_TABLET | Freq: Four times a day (QID) | ORAL | Status: DC | PRN
  Administered 2022-12-15 – 2022-12-17 (×2): 650 mg via ORAL
  Filled 2022-12-12 (×2): qty 2

## 2022-12-12 MED ORDER — HYDRALAZINE HCL 25 MG PO TABS
25.0000 mg | ORAL_TABLET | Freq: Two times a day (BID) | ORAL | Status: DC
  Administered 2022-12-12 – 2022-12-17 (×6): 25 mg via ORAL
  Filled 2022-12-12 (×15): qty 1

## 2022-12-12 MED ORDER — POTASSIUM CHLORIDE 10 MEQ/100ML IV SOLN
10.0000 meq | INTRAVENOUS | Status: AC
  Administered 2022-12-12 (×4): 10 meq via INTRAVENOUS
  Filled 2022-12-12 (×4): qty 100

## 2022-12-12 MED ORDER — MELATONIN 3 MG PO TABS
4.5000 mg | ORAL_TABLET | Freq: Every day | ORAL | Status: DC
  Administered 2022-12-12 – 2023-01-20 (×39): 4.5 mg via ORAL
  Filled 2022-12-12 (×22): qty 2
  Filled 2022-12-12: qty 1.5
  Filled 2022-12-12 (×18): qty 2

## 2022-12-12 MED ORDER — MEDIHONEY WOUND/BURN DRESSING EX PSTE
1.0000 | PASTE | Freq: Every day | CUTANEOUS | Status: DC
  Administered 2022-12-13 – 2022-12-20 (×5): 1 via TOPICAL
  Filled 2022-12-12 (×2): qty 44

## 2022-12-12 MED ORDER — PROPOFOL 10 MG/ML IV BOLUS
INTRAVENOUS | Status: AC
  Filled 2022-12-12: qty 20

## 2022-12-12 MED ORDER — ISOSORBIDE MONONITRATE ER 30 MG PO TB24
15.0000 mg | ORAL_TABLET | Freq: Every day | ORAL | Status: DC
  Filled 2022-12-12: qty 1

## 2022-12-12 MED ORDER — ASPIRIN 81 MG PO TBEC: 81.0000 mg | DELAYED_RELEASE_TABLET | Freq: Every day | ORAL | Status: DC

## 2022-12-12 MED ORDER — PROCHLORPERAZINE EDISYLATE 10 MG/2ML IJ SOLN
10.0000 mg | Freq: Four times a day (QID) | INTRAMUSCULAR | Status: DC | PRN
  Filled 2022-12-12: qty 2

## 2022-12-12 MED ORDER — CARVEDILOL 3.125 MG PO TABS: 3.1250 mg | ORAL_TABLET | Freq: Two times a day (BID) | ORAL | Status: DC

## 2022-12-12 MED ORDER — ACETAMINOPHEN 650 MG RE SUPP
650.0000 mg | Freq: Four times a day (QID) | RECTAL | Status: DC | PRN
  Filled 2022-12-12: qty 1

## 2022-12-12 MED ORDER — NITROGLYCERIN 0.4 MG SL SUBL: 0.4000 mg | SUBLINGUAL_TABLET | SUBLINGUAL | Status: DC | PRN

## 2022-12-12 MED ORDER — MAGNESIUM SULFATE 2 GM/50ML IV SOLN
2.0000 g | Freq: Once | INTRAVENOUS | Status: AC
  Administered 2022-12-12: 2 g via INTRAVENOUS
  Filled 2022-12-12 (×2): qty 50

## 2022-12-12 MED ORDER — PRAVASTATIN SODIUM 40 MG PO TABS
40.0000 mg | ORAL_TABLET | Freq: Every evening | ORAL | Status: DC
  Administered 2022-12-12 – 2022-12-16 (×5): 40 mg via ORAL
  Filled 2022-12-12 (×6): qty 1

## 2022-12-12 MED ORDER — DOCUSATE SODIUM 100 MG PO CAPS
100.0000 mg | ORAL_CAPSULE | Freq: Two times a day (BID) | ORAL | Status: DC
  Administered 2022-12-12 – 2022-12-20 (×13): 100 mg via ORAL
  Filled 2022-12-12 (×16): qty 1

## 2022-12-12 MED ORDER — LIDOCAINE HCL (PF) 2 % IJ SOLN
INTRAMUSCULAR | Status: AC
  Filled 2022-12-12: qty 5

## 2022-12-12 MED ORDER — ROCURONIUM BROMIDE 10 MG/ML (PF) SYRINGE
PREFILLED_SYRINGE | INTRAVENOUS | Status: AC
  Filled 2022-12-12: qty 10

## 2022-12-12 MED ORDER — POLYETHYLENE GLYCOL 3350 17 G PO PACK
17.0000 g | PACK | Freq: Every day | ORAL | Status: DC
  Administered 2022-12-15 – 2023-01-12 (×21): 17 g via ORAL
  Filled 2022-12-12 (×26): qty 1

## 2022-12-12 NOTE — Progress Notes (Signed)
Pt received ICG dye in preop prior to procedure being cancelled. Pt may have green tinged urine or fluid when dialyzed

## 2022-12-12 NOTE — Consult Note (Signed)
Cardiology Consult    Patient ID: [name] MRN: [id number], DOB/AGE: 77-Nov-1947   Admit date: 12/12/2022 Date of Consult: 12/12/2022  Primary Physician: Redmond School, MD Primary Cardiologist: Carlyle Dolly, MD Requesting Provider: Lenna Sciara. Roderic Palau, MD  Patient Profile    [name] is a 76 y.o. male with a history of coronary artery disease status post prior MI and drug-eluting stent placement to the LAD and first diagonal in November 2013, cardiomyopathy (EF 35 to 40% with global hypokinesis in August 2023), hypertension, hyperlipidemia, ? 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 Roderic Palau.  Past Medical History   Past Medical History:  Diagnosis Date   Anginal pain ([place])    Anxiety    Benign prostatic hypertrophy    Nocturia   CAD (coronary artery disease)    a.  NSTEMI 10/2012 s/p DES to LAD & DES to 1st diagonal with residual diffuse nonobstructive dz in LCx/RCA; b. 04/2022 MV: EF 55%, prior inferior MI. Small apical infarct w/ mild peri-infarct ischemia.   Cardiomyopathy ([place])    a. 03/2009 Echo: EF 50-55%; b. 01/2022 Echo: EF 60-65%; c. 07/2022 Echo: EF 35-40%, glob HK, worse @ apex. In Afib during study.   Chronic kidney disease, stage 3, mod decreased GFR (HCC)    Creatinine of 1.5 in 03/2009, STAGE 4 now   Degenerative joint disease    Depression    hx of   Diabetes mellitus, type II ([place]) 12/10/2012   Erectile dysfunction    Flu 2013   hx of   GERD (gastroesophageal reflux disease)    Gout    Hemorrhoids    High cholesterol    Hypertension    Exercise induced   Myocardial infarction (HCC)    Neuropathy    bilateral legs   NSVT (nonsustained ventricular tachycardia) (HCC)    Exercise induced   Obesity    PAF (paroxysmal atrial fibrillation)  ([place])    a. CHA2DS2VASc = 6.  No OAC   Spinal cord stimulator    Tubular adenoma 2014   Vertigo    everyday    Past Surgical History:  Procedure Laterality Date   BACK SURGERY  2007   x2   BIOPSY  02/05/2021   Procedure: BIOPSY;  Surgeon: Daneil Dolin, MD;  Location: [facility];  Service: Endoscopy;;   CARDIAC CATHETERIZATION     with stent placement   COLONOSCOPY WITH ESOPHAGOGASTRODUODENOSCOPY (EGD) N/A 11/23/2013   Dr. Oneida Alar- TCS= left sided colitis and mild proctitis likely due to ischemic colitis, moderate internal hemorrhoids, small nodule in the rectum, bx=tubular adenoma, EGD=-mild non-erosive gastritis   COLONOSCOPY WITH PROPOFOL N/A 07/24/2020   hemorrhoids,  rectosigmoid lipoma, small adenoma removed.   CORONARY ANGIOPLASTY     ESOPHAGOGASTRODUODENOSCOPY (EGD) WITH PROPOFOL N/A 02/05/2021   Normal esophagus, erythematous mucosa in entire stomach, no obvious ulcer, s/p biopsy. Normal duodenum. Negative H.pylori.    HERNIA REPAIR     INSERTION OF DIALYSIS CATHETER N/A 08/06/2022   Procedure: INSERTION OF DIALYSIS CATHETER;  Surgeon: Rosetta Posner, MD;  Location: [facility];  Service: Vascular;  Laterality: N/A;   IR EXCHANGE BILIARY DRAIN  10/17/2022   IR PERC CHOLECYSTOSTOMY  08/20/2022   JOINT REPLACEMENT     LEFT HEART CATHETERIZATION WITH CORONARY ANGIOGRAM N/A 11/16/2012  Procedure: LEFT HEART CATHETERIZATION WITH CORONARY ANGIOGRAM;  Surgeon: Sherren Mocha, MD;  Location: [facility] CATH LAB;  Service: Cardiovascular;  Laterality: N/A;   PERCUTANEOUS CORONARY STENT INTERVENTION (PCI-S)  11/16/2012   Procedure: PERCUTANEOUS CORONARY STENT INTERVENTION (PCI-S);  Surgeon: Sherren Mocha, MD;  Location: [facility] CATH LAB;  Service: Cardiovascular;;   POLYPECTOMY  07/24/2020   Procedure: POLYPECTOMY;  Surgeon: Daneil Dolin, MD;  Location: [facility];  Service: Endoscopy;;   SPINAL CORD STIMULATOR INSERTION N/A 06/10/2022   Procedure: Spinal cord stimulator placement;  Surgeon:  Newman Pies, MD;  Location: [place];  Service: Neurosurgery;  Laterality: N/A;  RM 19/ to follow   THUMB AMPUTATION Left    50 years ago   TOTAL KNEE ARTHROPLASTY Right 11/15/2013   Procedure: RIGHT TOTAL KNEE ARTHROPLASTY;  Surgeon: Mauri Pole, MD;  Location: [facility];  Service: Orthopedics;  Laterality: Right;   TOTAL KNEE ARTHROPLASTY Left 03/07/2015   Procedure: LEFT TOTAL KNEE ARTHROPLASTY;  Surgeon: Paralee Cancel, MD;  Location: [facility];  Service: Orthopedics;  Laterality: Left;     Allergies  Allergies  Allergen Reactions   Metformin Nausea And Vomiting   Niacin    Niacin And Related Other (See Comments)    Reaction: flushing and burning side effects even with extended release    History of Present Illness    76 year old male with above complex past medical history including CAD, cardiomyopathy, hypertension, hyperlipidemia, diabetes, ? of paroxysmal atrial fibrillation, first-degree AV block, Mobitz 1 heart block, type 2 diabetes mellitus, and end-stage renal disease.  Cardiac history dates back to November 2013, when he suffered a non-STEMI and was found to have severe LAD and diagonal disease.  Both areas were successfully treated with drug-eluting stents.  He was otherwise noted to have nonobstructive RCA and circumflex disease.  Patient has been followed closely in our [place] office.  Echo in February 2023 showed an EF of 60 to 65%.  In April 2023, presented to our office with complaints of chest pain refuse go to the emergency department.  He subsequently underwent a Lexiscan Myoview in May, which showed prior inferior infarct and a small apical infarct with mild peri-infarct ischemia and normal LV function.  Overall, this was felt to be a low risk study.  3 days later, he presented to the ED with recurrent chest pain and dyspnea.  In the setting of progressive renal failure, diagnostic catheterization has been avoided.  He was hospitalized in August 2023 with chest pain and  acute heart failure.  There was initially concern for atrial fibrillation with rates in the low 100s, and he was markedly volume overloaded.  Limited echocardiogram showed new LV dysfunction with an EF of 35 to 40% in the setting of atrial fibrillation (per echo report).  He had severe global hypokinesis that was worse at the apex.  Progress note hospitalization indicate that rhythm was more consistent with sinus rhythm, first-degree AV block, and intermittent Mobitz 1, and that A-fib was not seen.  In the setting of worsening renal function, he was seen by nephrology and started on hemodialysis.  He was readmitted in late August with epigastric and abdominal pain.  Imaging and exam were concerning for cholecystitis.  He was seen by general surgery with recommendation for percutaneous cholecystotomy tube, given high risk for surgery.  He was transferred to [facility] and seen by interventional radiology, and underwent cholecystotomy tube placement.  He was readmitted in late September with lightheadedness, relative hypotension, and anemia requiring  1 unit of blood.  Home antihypertensives were discontinued and hemodialysis was resumed prior to discharge.  [name] was seen in cardiology clinic on October 19 for preoperative valuation pending cholecystectomy.  After discussion with Dr. Harl Bowie, it was not felt the patient required any additional ischemic evaluation.  He was placed on low-dose carvedilol-3.125 mg twice daily.  Patient says that since then, he has been doing reasonably well at home.  He lives with his niece, who provides much of his care.  He is sedentary and does not exercise.  His niece drives him to dialysis 3 times a week.  Dialysis typically makes him feel weak but overall, he thinks he is tolerating.  He does not experience chest pain or dyspnea and also denies palpitations, presyncope, syncope, or edema.  He has been experiencing mid abdominal discomfort and persistent nausea.  He presented  today for elective cholecystectomy.  Upon arrival, ECG showed bradycardia and irregularity with deep inferior and anterolateral T wave inversions, which were new.  QT was also prolonged at 582 ms.  ECG shows sinus bradycardia at 59 with a first-degree AV block, Mobitz 1, septal infarct, and T changes as outlined above.  I do not see any evidence of A-fib on his telemetry and suspect irregularity is simply related to his intermittent Mobitz 1.  His procedure has been canceled and he will be observed.  Of note, lab work has shown a potassium of 2.8 and supplementation has been ordered.  Inpatient Medications     allopurinol  100 mg Oral Daily   aspirin EC  81 mg Oral Daily   carvedilol  3.125 mg Oral BID   Chlorhexidine Gluconate Cloth  6 each Topical Once   And   Chlorhexidine Gluconate Cloth  6 each Topical Once   cycloSPORINE  1 drop Both Eyes BID   docusate sodium  100 mg Oral BID   gabapentin  300 mg Oral QID   hydrALAZINE  25 mg Oral BID   insulin aspart  0-5 Units Subcutaneous QHS   insulin aspart  0-9 Units Subcutaneous TID WC   isosorbide mononitrate  15 mg Oral Daily   leptospermum manuka honey  1 Application Topical Daily   melatonin  5 mg Oral QHS   polyethylene glycol  17 g Oral Daily   pravastatin  40 mg Oral QPM    Family History    Family History  Problem Relation Age of Onset   Hypertension Other    Cancer Other    Heart disease Other    Diabetes Other    Hypertension Mother    Cancer Father        brain   CAD Brother    Colon cancer Neg Hx    He indicated that his mother is deceased. He indicated that his father is deceased. He indicated that his brother is alive. He indicated that the status of his neg hx is unknown.   Social History    Social History   Socioeconomic History   Marital status: Widowed    Spouse name: Not on file   Number of children: Not on file   Years of education: Not on file   Highest education level: Not on file  Occupational  History   Occupation: Disabled    Employer: RETIRED  Tobacco Use   Smoking status: Former    Packs/day: 1.00    Years: 30.00    Total pack years: 30.00    Types: Cigarettes    Start  date: 01/22/1965    Quit date: 12/23/1992    Years since quitting: 29.9   Smokeless tobacco: Never  Vaping Use   Vaping Use: Never used  Substance and Sexual Activity   Alcohol use: No    Alcohol/week: 0.0 standard drinks of alcohol   Drug use: No   Sexual activity: Not Currently  Other Topics Concern   Not on file  Social History Narrative   Married   Right handed   One story home no caffeine   Social Determinants of Health   Financial Resource Strain: Not on file  Food Insecurity: No Food Insecurity (09/13/2022)   Hunger Vital Sign    Worried About Running Out of Food in the Last Year: Never true    Ran Out of Food in the Last Year: Never true  Transportation Needs: No Transportation Needs (09/13/2022)   PRAPARE - Hydrologist (Medical): No    Lack of Transportation (Non-Medical): No  Physical Activity: Not on file  Stress: Not on file  Social Connections: Not on file  Intimate Partner Violence: Not At Risk (09/13/2022)   Humiliation, Afraid, Rape, and Kick questionnaire    Fear of Current or Ex-Partner: No    Emotionally Abused: No    Physically Abused: No    Sexually Abused: No     Review of Systems    General:  No chills, fever, night sweats or weight changes.  Cardiovascular:  No chest pain, dyspnea on exertion, edema, orthopnea, palpitations, paroxysmal nocturnal dyspnea. Dermatological: No rash, lesions/masses Respiratory: No cough, dyspnea Urologic: No hematuria, dysuria Abdominal:   No nausea, vomiting, diarrhea, bright red blood per rectum, melena, or hematemesis Neurologic:  No visual changes, wkns, changes in mental status. All other systems reviewed and are otherwise negative except as noted above.  Physical Exam    Blood pressure 135/71,  pulse 80, temperature 97.8 F (36.6 C), temperature source Oral, resp. rate 16, SpO2 98 %.  General: Pleasant, NAD Psych: Flat affect. Neuro: Alert and oriented X 3. Moves all extremities spontaneously. HEENT: Normal  Neck: Supple without bruits or JVD. Lungs:  Resp regular and unlabored, CTA. Heart: RRR no s3, s4, or murmurs. Abdomen: Soft, diffusely tender to light touch, non-distended, BS + x 4.  Extremities: No clubbing, cyanosis or edema. DP/PT1+, Radials 2+ and equal bilaterally.  Labs      Lab Results  Component Value Date   WBC 6.6 11/28/2022   HGB 8.6 (L) 12/12/2022   HCT 27.8 (L) 12/12/2022   MCV 104.3 (H) 11/28/2022   PLT 227 11/28/2022    Recent Labs  Lab 12/12/22 0637  NA 138  K 2.8*  CL 96*  CO2 29  BUN 13  CREATININE 3.09*  CALCIUM 9.3  GLUCOSE 119*   Lab Results  Component Value Date   CHOL 99 05/10/2022   HDL 29 (L) 05/10/2022   LDLCALC 46 05/10/2022   TRIG 121 05/10/2022     Radiology Studies    CT Lumbar Spine Wo Contrast  Result Date: 11/28/2022 CLINICAL DATA:  76 year old male with back pain after "felt a pop" while moving. Prior surgery. EXAM: CT LUMBAR SPINE WITHOUT CONTRAST TECHNIQUE: Multidetector CT imaging of the lumbar spine was performed without intravenous contrast administration. Multiplanar CT image reconstructions were also generated. RADIATION DOSE REDUCTION: This exam was performed according to the departmental dose-optimization program which includes automated exposure control, adjustment of the mA and/or kV according to patient size and/or use of iterative reconstruction  technique. COMPARISON:  CT Abdomen and Pelvis 11/04/2022. Lumbar MRI 09/06/2021. FINDINGS: Segmentation: Normal, the same numbering system used on the MRI last year. Alignment: Stable chronic straightening of lumbar lordosis. Vertebrae: Chronic osteopenia. Chronic interbody ankylosis from flowing endplate osteophytes Q00-Q6 and L1-L2. And chronic lumbar fusion below  that level, operative details below. Chronic L1 inferior endplate deformity and sclerosis appears stable from last month and a CT Abdomen and Pelvis in September. Likewise, visible T11 and T12 vertebral height appears stable. Visible sacral ala and SI joints also appear intact. No acute osseous abnormality identified. Visible lower ribs appears stable and intact. Paraspinal and other soft tissues: Stable, including Aortoiliac calcified atherosclerosis. Stable postoperative changes to the paraspinal soft tissues and right flank stimulator or generator device. Disc levels: Chronic interbody ankylosis from flowing endplate osteophytes P61-P5 and L1-L2. L2 through L5 decompression and fusion with solid appearing arthrodesis and no evidence of loosening. Mild chronic L5-S1 adjacent segment disease, primarily facet degeneration. IMPRESSION: 1.  No acute osseous abnormality identified. 2. Stable and satisfactory appearing L2 through L5 decompression and fusion, with superimposed chronic T12 through L2 ankylosis due to flowing endplate osteophytes. Mild chronic L5-S1 adjacent segment disease. 3.  Aortic Atherosclerosis (ICD10-I70.0). Electronically Signed   By: Genevie Ann M.D.   On: 11/28/2022 07:13    ECG & Cardiac Imaging    Sinus bradycardia, 59, first-degree AV block with Mobitz 1, septal infarct, inferior and anterolateral T wave inversion, prolonged QTc (582)- personally reviewed.  Assessment & Plan    1.  Abnormal ECG/first-degree AV block/Mobitz 1 heart block: Patient presented today for elective cholecystectomy and preoperative ECG notable for sinus bradycardia, first-degree AV block, intermittent Mobitz 1 heart block, septal infarct, and new, deep inferior and anterolateral T wave inversions with prolonged QT (QTc 582).  Lab work notable for potassium of 2.8 and supplementation has been ordered.  Magnesium is currently pending.  Patient has a fairly significant first-degree AV block and in that setting, I  will discontinue beta-blocker therapy.  Fortunately, he has not been experiencing any chest pain, dyspnea, presyncope, or syncope.  Follow-up lab work and ECG in the a.m.  2.  Preoperative cardiovascular examination/CAD: Status post non-STEMI in 2013 with LAD and diagonal drug-eluting stent placement.  Otherwise had nonobstructive circumflex and RCA disease.  Patient had a low risk Myoview earlier this year with evidence of inferior infarct and small apical defect with mild reversibility.  EF at that time was normal with subsequent echo over the summer, in the setting of A-fib showing LV dysfunction with an EF of 35 to 40% and global hypokinesis, worse in the apex.  Patient previously seen for preop eval in our outpatient clinic pending cholecystectomy, which was scheduled for today and is currently on hold in the setting of abnormal ECG as outlined above.  He was felt to be low risk for cardiac complications at that time.  Currently patient is in sinus rhythm with first-degree AV block and intermittent Mobitz 1 heart block, a diagnosis he carries historically.  New T wave changes as outlined above.  Supplement electrolytes as appropriate.  I will arrange for a limited echocardiogram to reevaluate LV function in sinus rhythm.  He has not been experiencing chest pain or dyspnea but has been having significant abdominal pain in the setting of cholecystitis and obviously, we would like to get him through the surgery sooner rather than later and as safely as possible.  Discontinuing beta-blocker in the setting of #1.  3.  Questionable  history of paroxysmal atrial fibrillation: This was initially diagnosed during hospitalization in August however subsequent notes indicate that patient was maintaining sinus rhythm with first-degree AV block and intermittent Mobitz 1 resulting in some irregularity but no observable A-fib.  Echo report indicates that he was in A-fib during his echo at that time.  In the setting of  unclear diagnosis, he has not been anticoagulated.  Initially concern related to slow A-fib today however review of twelve-lead's and telemetry monitoring shows sinus rhythm with first-degree AV block and intermittent Mobitz 1 as outlined above.  As above, discontinue carvedilol in the setting of bradycardia with significant first-degree AV block.  4.  Cardiomyopathy: EF 35 to 40% by echo in August.  As above, echo report notes that he was in A-fib during that study though other progress notes indicate that he likely was not.  Volume management per hemodialysis and he is euvolemic on exam.  GDMT has been limited and he is currently on hydralazine and nitrates.  Discontinuing beta-blocker as outlined above.  Follow-up limited echo in the setting of abnormal EKG and need for preoperative evaluation.  5.  End-stage renal disease: Followed closely by nephrology and on 3 times a week dialysis.  6.  Essential hypertension: Currently stable on multiple medications including hydralazine, nitrate, and carvedilol, which has been discontinued today.  7.  Hyperlipidemia: LDL of 46 earlier this year.  Continue statin therapy.  8.  Macrocytic anemia: Relatively stable.  9.  Hypokalemia: Potassium 2.8 and likely contributing to abnormal ECG with prolonged QT and T wave inversions.  Supplementation has been ordered.  Magnesium is pending.  Signed, Murray Hodgkins, NP 12/12/2022, 11:56 AM  For questions or updates, please contact   Please consult [email or web address] for contact info under Cardiology/STEMI.

## 2022-12-12 NOTE — Progress Notes (Addendum)
Pt has bedsore noted to LFT buttock, pt very uncomfortable in pre-op, pt turned off of effected side.

## 2022-12-12 NOTE — Progress Notes (Signed)
*  PRELIMINARY RESULTS* Echocardiogram 2D Echocardiogram has been performed.  [name] 12/12/2022, 2:37 PM

## 2022-12-12 NOTE — H&P (Signed)
History and Physical    [name] TOI:[id number] DOB: [birth year] DOA: 12/12/2022  PCP: Redmond School, MD  Patient coming from: home  I have personally briefly reviewed patient's old medical records in [place]  Chief Complaint: nausea  HPI: [name] is a 76 y.o. male with medical history significant of end-stage renal disease on hemodialysis, HFrEF with EF 35 to 40% in 07/2022, diabetes, hypertension, who has had issues with recurrent nausea.  He has had this over the last several months and it was initially felt to be related to cholecystitis.  Due to his high risk for surgery, he had cholecystostomy tube placed in the last few months.  Recently, his tube was dislodged and came out.  Surgery notes indicate that he had recanalized his bile duct and therefore tube was not replaced.  He had presented today for elective cholecystectomy.  Preop evaluation including EKG had noted some EKG changes including diffuse T wave inversions and prolonged QT.  Preop labs show that he has hypokalemia.  He reports continued issues with nausea and poor p.o. intake.  Does not really have vomiting or diarrhea.  Denies any recent chest pain or shortness of breath.  He has normal dialysis schedule is Monday, Wednesday, Friday and his last dialysis session was yesterday as scheduled.  He had a shortened session for 2 hours which she reports was ended because he had to have a bowel movement.  He was recently seen in the emergency room at the beginning of this month where he was noted to have trouble with walking and overall weakness.  He was offered skilled nursing facility placement for rehab, but he ultimately elected to go home.  Due to EKG changes and underlying cardiac issues, his surgery today was canceled.  I was contacted by Dr. Constance Haw regarding medical admission to optimize his cardiac status.  Cardiology consultation requested for preop evaluation and to optimize his cardiac status.  Nephrology  also consulted for dialysis needs.   Review of Systems: As per HPI otherwise 10 point review of systems negative.    Past Medical History:  Diagnosis Date   Anginal pain ([place])    Anxiety    Benign prostatic hypertrophy    Nocturia   CAD (coronary artery disease)    a.  NSTEMI 10/2012 s/p DES to LAD & DES to 1st diagonal with residual diffuse nonobstructive dz in LCx/RCA    Chronic kidney disease, stage 3, mod decreased GFR (HCC)    Creatinine of 1.5 in 03/2009, STAGE 4 now   Degenerative joint disease    Depression    hx of   Diabetes mellitus, type II ([place]) 83/38/2505   Diastolic heart failure    Pedal edema, "a long time ago"   Erectile dysfunction    Flu 2013   hx of   GERD (gastroesophageal reflux disease)    Gout    Hemorrhoids    High cholesterol    Hypertension    Exercise induced   Myocardial infarction ([place])    Neuropathy    bilateral legs   NSVT (nonsustained ventricular tachycardia) ([place])    Exercise induced   Obesity    Spinal cord stimulator    Tubular adenoma 2014   Vertigo    everyday    Past Surgical History:  Procedure Laterality Date   BACK SURGERY  2007   x2   BIOPSY  02/05/2021   Procedure: BIOPSY;  Surgeon: Daneil Dolin, MD;  Location: [facility];  Service: Endoscopy;;   CARDIAC CATHETERIZATION     with stent placement   COLONOSCOPY WITH ESOPHAGOGASTRODUODENOSCOPY (EGD) N/A 11/23/2013   Dr. Oneida Alar- TCS= left sided colitis and mild proctitis likely due to ischemic colitis, moderate internal hemorrhoids, small nodule in the rectum, bx=tubular adenoma, EGD=-mild non-erosive gastritis   COLONOSCOPY WITH PROPOFOL N/A 07/24/2020   hemorrhoids,  rectosigmoid lipoma, small adenoma removed.   CORONARY ANGIOPLASTY     ESOPHAGOGASTRODUODENOSCOPY (EGD) WITH PROPOFOL N/A 02/05/2021   Normal esophagus, erythematous mucosa in entire stomach, no obvious ulcer, s/p biopsy. Normal duodenum. Negative H.pylori.    HERNIA REPAIR     INSERTION OF  DIALYSIS CATHETER N/A 08/06/2022   Procedure: INSERTION OF DIALYSIS CATHETER;  Surgeon: Rosetta Posner, MD;  Location: [facility];  Service: Vascular;  Laterality: N/A;   IR EXCHANGE BILIARY DRAIN  10/17/2022   IR PERC CHOLECYSTOSTOMY  08/20/2022   JOINT REPLACEMENT     LEFT HEART CATHETERIZATION WITH CORONARY ANGIOGRAM N/A 11/16/2012   Procedure: LEFT HEART CATHETERIZATION WITH CORONARY ANGIOGRAM;  Surgeon: Sherren Mocha, MD;  Location: [facility] CATH LAB;  Service: Cardiovascular;  Laterality: N/A;   PERCUTANEOUS CORONARY STENT INTERVENTION (PCI-S)  11/16/2012   Procedure: PERCUTANEOUS CORONARY STENT INTERVENTION (PCI-S);  Surgeon: Sherren Mocha, MD;  Location: [facility] CATH LAB;  Service: Cardiovascular;;   POLYPECTOMY  07/24/2020   Procedure: POLYPECTOMY;  Surgeon: Daneil Dolin, MD;  Location: [facility];  Service: Endoscopy;;   SPINAL CORD STIMULATOR INSERTION N/A 06/10/2022   Procedure: Spinal cord stimulator placement;  Surgeon: Newman Pies, MD;  Location: [place];  Service: Neurosurgery;  Laterality: N/A;  RM 19/ to follow   THUMB AMPUTATION Left    50 years ago   TOTAL KNEE ARTHROPLASTY Right 11/15/2013   Procedure: RIGHT TOTAL KNEE ARTHROPLASTY;  Surgeon: Mauri Pole, MD;  Location: [facility];  Service: Orthopedics;  Laterality: Right;   TOTAL KNEE ARTHROPLASTY Left 03/07/2015   Procedure: LEFT TOTAL KNEE ARTHROPLASTY;  Surgeon: Paralee Cancel, MD;  Location: [facility] ORS;  Service: Orthopedics;  Laterality: Left;    Social History:  reports that he quit smoking about 29 years ago. His smoking use included cigarettes. He started smoking about 57 years ago. He has a 30.00 pack-year smoking history. He has never used smokeless tobacco. He reports that he does not drink alcohol and does not use drugs.  Allergies  Allergen Reactions   Metformin Nausea And Vomiting   Niacin    Niacin And Related Other (See Comments)    Reaction: flushing and burning side effects even with extended release     Family History  Problem Relation Age of Onset   Hypertension Other    Cancer Other    Heart disease Other    Diabetes Other    Hypertension Mother    Cancer Father        brain   CAD Brother    Colon cancer Neg Hx    Family history: Family history reviewed and not pertinent  Prior to Admission medications   Medication Sig Start Date End Date Taking? Authorizing Provider  acarbose (PRECOSE) 50 MG tablet Take 50 mg by mouth in the morning, at noon, and at bedtime.  10/20/17  Yes [provider]  acetaminophen (TYLENOL) 500 MG tablet Take 2 tablets (1,000 mg total) by mouth every 6 (six) hours. 08/10/22  Yes Shahmehdi, Seyed A, MD  allopurinol (ZYLOPRIM) 100 MG tablet Take 100 mg by mouth daily. 08/17/20  Yes [provider]  ascorbic acid (  VITAMIN C) 500 MG tablet Take 500 mg by mouth daily.   Yes [provider]  aspirin EC 81 MG tablet Take 1 tablet (81 mg total) by mouth daily. 09/14/19  Yes Herminio Commons, MD  calcitRIOL (ROCALTROL) 0.25 MCG capsule Take 0.25 mcg by mouth every Sunday.  12/13/16  Yes [provider]  carvedilol (COREG) 3.125 MG tablet Take 1 tablet (3.125 mg total) by mouth 2 (two) times daily. 10/10/22  Yes Bhagat, Bhavinkumar, PA  docusate sodium (COLACE) 100 MG capsule Take 1 capsule (100 mg total) by mouth 2 (two) times daily. 09/16/22 09/16/23 Yes Shah, Pratik D, DO  epoetin alfa (EPOGEN) 3000 UNIT/ML injection Inject into the skin. 07/03/22  Yes [provider]  ferrous sulfate 325 (65 FE) MG tablet Take 1 tablet (325 mg total) by mouth daily. 09/16/22 09/16/23 Yes Shah, Pratik D, DO  gabapentin (NEURONTIN) 300 MG capsule Take 300 mg by mouth 4 (four) times daily.   Yes [provider]  hydrALAZINE (APRESOLINE) 25 MG tablet Take 1 tablet (25 mg total) by mouth in the morning and at bedtime. 10/10/22  Yes Bhagat, Bhavinkumar, PA  insulin NPH Human (NOVOLIN N) 100 UNIT/ML injection Inject 0.1 mLs (10 Units  total) into the skin every morning. Ands syringes 1/day 09/16/22  Yes Shah, Pratik D, DO  isosorbide mononitrate (IMDUR) 30 MG 24 hr tablet Take 0.5 tablets (15 mg total) by mouth daily. 10/10/22  Yes Bhagat, Bhavinkumar, PA  meclizine (ANTIVERT) 12.5 MG tablet Take 1 tablet (12.5 mg total) by mouth 3 (three) times daily as needed for dizziness. 09/16/22  Yes Shah, Pratik D, DO  melatonin 5 MG TABS Take 5 mg by mouth at bedtime.   Yes [provider]  Multiple Vitamins-Minerals (MULTIVITAMIN WITH MINERALS) tablet Take 1 tablet by mouth daily.   Yes [provider]  pravastatin (PRAVACHOL) 40 MG tablet Take 1 tablet (40 mg total) by mouth every evening. 07/31/21 02/09/28 Yes Imogene Burn, PA-C  simethicone (MYLICON) 80 MG chewable tablet Chew 80 mg by mouth every 6 (six) hours as needed for flatulence.   Yes [provider]  traMADol (ULTRAM) 50 MG tablet Take 50 mg by mouth 4 (four) times daily as needed for pain. 08/14/22  Yes [provider]  Zinc 50 MG TABS Take 1 tablet by mouth daily.   Yes [provider]  B Complex-C-Zn-Folic Acid (DIALYVITE 294-TMLY 15) 0.8 MG TABS Take 1 tablet by mouth daily. 09/04/22   [provider]  carboxymethylcellulose (REFRESH PLUS) 0.5 % SOLN Place 1 drop into both eyes daily as needed (dry eyes).    [provider]  nitroGLYCERIN (NITROSTAT) 0.4 MG SL tablet Place 1 tablet (0.4 mg total) under the tongue every 5 (five) minutes as needed for chest pain. PLACE ONE TABLET UNDER THE TONGUE EVERY 5 MINUTES FOR 3 DOSES AS NEEDED Strength: 0.4 mg 02/07/22   Arnoldo Lenis, MD  polyethylene glycol (MIRALAX / GLYCOLAX) 17 g packet Take 17 g by mouth daily. 08/11/22   Shahmehdi, Valeria Batman, MD  promethazine (PHENERGAN) 25 MG suppository Place 25 mg rectally every 12 (twelve) hours as needed for nausea or vomiting.    [provider]  RESTASIS 0.05 % ophthalmic emulsion Place 1 drop into both eyes 2 (two)  times daily. 06/19/22   [provider]  senna (SENOKOT) 8.6 MG TABS tablet Take 2 tablets (17.2 mg total) by mouth 2 (two) times daily. Patient not taking: Reported on 11/28/2022  08/10/22   Shahmehdi, Valeria Batman, MD  sitaGLIPtin (JANUVIA) 25 MG tablet Take 25 mg by mouth daily.    [provider]  TRUE METRIX BLOOD GLUCOSE TEST test strip USE TO TEST BLOOD SUGAR UP TO TWICE DAILY OR AS DIRECTED 08/31/21   Renato Shin, MD    Physical Exam: Vitals:   12/12/22 0641  BP: 135/71  Pulse: 80  Resp: 16  Temp: 97.8 F (36.6 C)  TempSrc: Oral  SpO2: 98%    Constitutional: NAD, calm, comfortable Eyes: PERRL, lids and conjunctivae normal ENMT: Mucous membranes are moist. Posterior pharynx clear of any exudate or lesions.Normal dentition.  Neck: normal, supple, no masses, no thyromegaly Respiratory: clear to auscultation bilaterally, no wheezing, no crackles. Normal respiratory effort. No accessory muscle use.  Cardiovascular: Regular rate and rhythm, no murmurs / rubs / gallops. No extremity edema. 2+ pedal pulses. No carotid bruits.  Abdomen: no tenderness, no masses palpated. No hepatosplenomegaly. Bowel sounds positive.  Musculoskeletal: no clubbing / cyanosis. No joint deformity upper and lower extremities. Good ROM, no contractures. Normal muscle tone.  Skin: wound on left buttock as pictured below Neurologic: CN 2-12 grossly intact. Sensation intact, DTR normal. Strength 5/5 in all 4.  Psychiatric: Normal judgment and insight. Alert and oriented x 3. Normal mood.      Labs on Admission: I have personally reviewed following labs and imaging studies  CBC: Recent Labs  Lab 12/12/22 0637  HGB 8.6*  HCT 16.1*   Basic Metabolic Panel: Recent Labs  Lab 12/12/22 0637  NA 138  K 2.8*  CL 96*  CO2 29  GLUCOSE 119*  BUN 13  CREATININE 3.09*  CALCIUM 9.3   GFR: Estimated Creatinine Clearance: 23.3 mL/min (A) (by C-G formula based on SCr of 3.09 mg/dL (H)). Liver  Function Tests: No results for input(s): "AST", "ALT", "ALKPHOS", "BILITOT", "PROT", "ALBUMIN" in the last 168 hours. No results for input(s): "LIPASE", "AMYLASE" in the last 168 hours. No results for input(s): "AMMONIA" in the last 168 hours. Coagulation Profile: No results for input(s): "INR", "PROTIME" in the last 168 hours. Cardiac Enzymes: No results for input(s): "CKTOTAL", "CKMB", "CKMBINDEX", "TROPONINI" in the last 168 hours. BNP (last 3 results) No results for input(s): "PROBNP" in the last 8760 hours. HbA1C: Recent Labs    12/10/22 1122  HGBA1C 4.4*   CBG: No results for input(s): "GLUCAP" in the last 168 hours. Lipid Profile: No results for input(s): "CHOL", "HDL", "LDLCALC", "TRIG", "CHOLHDL", "LDLDIRECT" in the last 72 hours. Thyroid Function Tests: No results for input(s): "TSH", "T4TOTAL", "FREET4", "T3FREE", "THYROIDAB" in the last 72 hours. Anemia Panel: No results for input(s): "VITAMINB12", "FOLATE", "FERRITIN", "TIBC", "IRON", "RETICCTPCT" in the last 72 hours. Urine analysis:    Component Value Date/Time   COLORURINE AMBER (A) 11/04/2022 1408   APPEARANCEUR HAZY (A) 11/04/2022 1408   LABSPEC 1.017 11/04/2022 1408   PHURINE 5.0 11/04/2022 1408   GLUCOSEU NEGATIVE 11/04/2022 1408   HGBUR NEGATIVE 11/04/2022 1408   BILIRUBINUR NEGATIVE 11/04/2022 1408   KETONESUR 5 (A) 11/04/2022 1408   PROTEINUR 100 (A) 11/04/2022 1408   UROBILINOGEN 1.0 03/02/2015 1100   NITRITE NEGATIVE 11/04/2022 1408   LEUKOCYTESUR NEGATIVE 11/04/2022 1408    Radiological Exams on Admission: No results found.  EKG: Independently reviewed. Prolong QTc with diffuse T inv  Assessment/Plan Principal Problem:   Acute cholecystitis Active Problems:   ESRD (end stage renal disease) (HCC)   Hypokalemia   Diabetes mellitus type 2 in obese ([place])  Anemia, normocytic normochromic   Essential hypertension   HFrEF (heart failure with reduced ejection fraction) (HCC)   Nausea and  vomiting   Pressure injury of skin of sacral region   Prolonged QT interval   Generalized weakness     Nausea and vomiting -Possibly related to cholecystitis -Discussed with Dr. Constance Haw, surgery has been canceled today, but she is tentatively posted his case for tomorrow -Continue antiemetics -Cardiology consultation for preop evaluation, optimization of cardiac status -Correcting electrolytes -N.p.o. after midnight  Prolonged QTc, T wave inversions -Suspect this is related to hypokalemia -Avoid QT prolonging agents  Hypokalemia -Likely related to decreased p.o. intake -Replace potassium, check magnesium  HFrEF -Last echo from 07/2022 shows EF 35 to 40% -On GDMT, although blood pressure issues have limited treatment -Currently volume status appears compensated -Cardiology consulted  Anemia of end-stage renal disease -No reported bleeding -Hemoglobin appears to be near baseline  Sacral pressure injury -Present on admission -Does not appear to have surrounding cellulitis -Continue wound care, WOC consult  Generalized weakness -Suspect that he may need placement -I discussed this option with the patient and he is agreeable to placement if indicated -PT/OT  Diabetes type 2 -Holding oral agents -Continue on sliding scale insulin    DVT prophylaxis: SCDs  Code Status: full code  Family Communication: updated patient's brother over the phone  Disposition Plan: may need placement  Consults called: nephrology, cardiology, general surgery  Admission status: observation, tele   Kathie Dike MD [facility]   If 7PM-7AM, please contact night-coverage [email or web address]   12/12/2022, 9:33 AM

## 2022-12-12 NOTE — Progress Notes (Signed)
Was notified of this pts observation admission.  He is ESRD-  completed 2 hours of his treatment at the OP center yesterday.   There are no indications for additional dialysis today.   Depending on nursing preference and disposition the next planned HD will be either tomorrow or Saturday.  I will do full consult if admission changed to inpatient    His OP orders are at [facility]  4 hours-  MWF TDC -  400 BFR 2K/2.5 calc bath/ EDW 84.5  heparin 4000 bolus Mircera 225 q 2 weeks Last hgb 7.6, calc 9.2, phos 3.9, PTH 70  Angelea Penny A Eastin Swing

## 2022-12-12 NOTE — Consult Note (Addendum)
[facility] Consult    HPI: [name] is a 76 y.o. male with a history of acute cholecystitis dating back to 07/2022. He had just a few weeks before had a dialysis of ESRD and started on dialysis after being admitted for CHF exacerbation with CP, SOB and fluid overload. He had a cholecystostomy tube placed at that time, and kept it for months. His last IR appt showed that it had re-cannulated, and he was going in and out of SNF around that time.  In early November his niece let us know that his cholecystostomy tube fell out. He was feeling ok and had re-cannulated his duct so we did not have it replaced.   Cardiology Dr. Curly Shores had seen him for clearance and he had discussed with Dr. Harl Bowie and they felt he was clear for surgery for acceptable risk in October.   Today he comes in and did not complete dialysis yesterday and he reports this is because he had to have a BM. He also was vomiting this morning.  Anesthesia also noted EKG changes. He reports he has been at home now, and not in a SNF since I saw him last about 1 month ago.  His niece is here with him, and reports she is changing his bedsore with  gauze packing and saline.   This morning, it is not felt that he is safe to proceed with any anesthesia. I discussed this with him and also discussed that his continued complaint of nausea may not be from the gallbladder. We  discussed that his renal disease could also be contributing.   His niece also reported a UTI that is currently being treated with doxycycline after he had penial drainage.   Past Medical History:  Diagnosis Date   Anginal pain ([place])    Anxiety    Benign prostatic hypertrophy    Nocturia   CAD (coronary artery disease)    a.  NSTEMI 10/2012 s/p DES to LAD & DES to 1st diagonal with residual diffuse nonobstructive dz in LCx/RCA    Chronic kidney disease, stage 3, mod decreased GFR (HCC)    Creatinine of 1.5 in 03/2009, STAGE 4 now   Degenerative  joint disease    Depression    hx of   Diabetes mellitus, type II ([place]) 93/57/0177   Diastolic heart failure    Pedal edema, "a long time ago"   Erectile dysfunction    Flu 2013   hx of   GERD (gastroesophageal reflux disease)    Gout    Hemorrhoids    High cholesterol    Hypertension    Exercise induced   Myocardial infarction ([place])    Neuropathy    bilateral legs   NSVT (nonsustained ventricular tachycardia) (HCC)    Exercise induced   Obesity    Spinal cord stimulator    Tubular adenoma 2014   Vertigo    everyday    Past Surgical History:  Procedure Laterality Date   BACK SURGERY  2007   x2   BIOPSY  02/05/2021   Procedure: BIOPSY;  Surgeon: Daneil Dolin, MD;  [facility];  Service: Endoscopy;;   CARDIAC CATHETERIZATION     with stent placement   COLONOSCOPY WITH ESOPHAGOGASTRODUODENOSCOPY (EGD) N/A 11/23/2013   Dr. Oneida Alar- TCS= left sided colitis and mild proctitis likely due to ischemic colitis, moderate internal hemorrhoids, small nodule in the rectum, bx=tubular adenoma, EGD=-mild non-erosive gastritis   COLONOSCOPY WITH PROPOFOL N/A 07/24/2020  hemorrhoids,  rectosigmoid lipoma, small adenoma removed.   CORONARY ANGIOPLASTY     ESOPHAGOGASTRODUODENOSCOPY (EGD) WITH PROPOFOL N/A 02/05/2021   Normal esophagus, erythematous mucosa in entire stomach, no obvious ulcer, s/p biopsy. Normal duodenum. Negative H.pylori.    HERNIA REPAIR     INSERTION OF DIALYSIS CATHETER N/A 08/06/2022   Procedure: INSERTION OF DIALYSIS CATHETER;  Surgeon: Rosetta Posner, MD;  Location: [facility] ORS;  Service: Vascular;  Laterality: N/A;   IR EXCHANGE BILIARY DRAIN  10/17/2022   IR PERC CHOLECYSTOSTOMY  08/20/2022   JOINT REPLACEMENT     LEFT HEART CATHETERIZATION WITH CORONARY ANGIOGRAM N/A 11/16/2012   Procedure: LEFT HEART CATHETERIZATION WITH CORONARY ANGIOGRAM;  Surgeon: Sherren Mocha, MD;  Location: [facility] CATH LAB;  Service: Cardiovascular;  Laterality: N/A;   PERCUTANEOUS  CORONARY STENT INTERVENTION (PCI-S)  11/16/2012   Procedure: PERCUTANEOUS CORONARY STENT INTERVENTION (PCI-S);  Surgeon: Sherren Mocha, MD;  Location: [facility] CATH LAB;  Service: Cardiovascular;;   POLYPECTOMY  07/24/2020   Procedure: POLYPECTOMY;  Surgeon: Daneil Dolin, MD;  Location[facility];  Service: Endoscopy;;   SPINAL CORD STIMULATOR INSERTION N/A 06/10/2022   Procedure: Spinal cord stimulator placement;  Surgeon: Newman Pies, MD;  Location: [place];  Service: Neurosurgery;  Laterality: N/A;  RM 19/ to follow   THUMB AMPUTATION Left    50 years ago   TOTAL KNEE ARTHROPLASTY Right 11/15/2013   Procedure: RIGHT TOTAL KNEE ARTHROPLASTY;  Surgeon: Mauri Pole, MD;  Location: [facility];  Service: Orthopedics;  Laterality: Right;   TOTAL KNEE ARTHROPLASTY Left 03/07/2015   Procedure: LEFT TOTAL KNEE ARTHROPLASTY;  Surgeon: Paralee Cancel, MD;  Location: [facility];  Service: Orthopedics;  Laterality: Left;    Family History  Problem Relation Age of Onset   Hypertension Other    Cancer Other    Heart disease Other    Diabetes Other    Hypertension Mother    Cancer Father        brain   CAD Brother    Colon cancer Neg Hx     Social History   Tobacco Use   Smoking status: Former    Packs/day: 1.00    Years: 30.00    Total pack years: 30.00    Types: Cigarettes    Start date: 01/22/1965    Quit date: 12/23/1992    Years since quitting: 29.9   Smokeless tobacco: Never  Vaping Use   Vaping Use: Never used  Substance Use Topics   Alcohol use: No    Alcohol/week: 0.0 standard drinks of alcohol   Drug use: No    Medications: I have reviewed the patient's current medications. Medications Prior to Admission  Medication Sig Dispense Refill   acarbose (PRECOSE) 50 MG tablet Take 50 mg by mouth in the morning, at noon, and at bedtime.      acetaminophen (TYLENOL) 500 MG tablet Take 2 tablets (1,000 mg total) by mouth every 6 (six) hours. 30 tablet 0   allopurinol (ZYLOPRIM) 100  MG tablet Take 100 mg by mouth daily.     ascorbic acid (VITAMIN C) 500 MG tablet Take 500 mg by mouth daily.     aspirin EC 81 MG tablet Take 1 tablet (81 mg total) by mouth daily. 90 tablet 3   calcitRIOL (ROCALTROL) 0.25 MCG capsule Take 0.25 mcg by mouth every Sunday.      carvedilol (COREG) 3.125 MG tablet Take 1 tablet (3.125 mg total) by mouth 2 (two) times daily. 60 tablet  2   docusate sodium (COLACE) 100 MG capsule Take 1 capsule (100 mg total) by mouth 2 (two) times daily. 60 capsule 2   epoetin alfa (EPOGEN) 3000 UNIT/ML injection Inject into the skin.     ferrous sulfate 325 (65 FE) MG tablet Take 1 tablet (325 mg total) by mouth daily. 30 tablet 3   gabapentin (NEURONTIN) 300 MG capsule Take 300 mg by mouth 4 (four) times daily.     hydrALAZINE (APRESOLINE) 25 MG tablet Take 1 tablet (25 mg total) by mouth in the morning and at bedtime. 60 tablet 2   insulin NPH Human (NOVOLIN N) 100 UNIT/ML injection Inject 0.1 mLs (10 Units total) into the skin every morning. Ands syringes 1/day 10 mL 11   isosorbide mononitrate (IMDUR) 30 MG 24 hr tablet Take 0.5 tablets (15 mg total) by mouth daily. 30 tablet 2   meclizine (ANTIVERT) 12.5 MG tablet Take 1 tablet (12.5 mg total) by mouth 3 (three) times daily as needed for dizziness. 30 tablet 0   melatonin 5 MG TABS Take 5 mg by mouth at bedtime.     Multiple Vitamins-Minerals (MULTIVITAMIN WITH MINERALS) tablet Take 1 tablet by mouth daily.     pravastatin (PRAVACHOL) 40 MG tablet Take 1 tablet (40 mg total) by mouth every evening. 90 tablet 3   simethicone (MYLICON) 80 MG chewable tablet Chew 80 mg by mouth every 6 (six) hours as needed for flatulence.     traMADol (ULTRAM) 50 MG tablet Take 50 mg by mouth 4 (four) times daily as needed for pain.     Zinc 50 MG TABS Take 1 tablet by mouth daily.     B Complex-C-Zn-Folic Acid (DIALYVITE 161-WRUE 15) 0.8 MG TABS Take 1 tablet by mouth daily.     carboxymethylcellulose (REFRESH PLUS) 0.5 % SOLN  Place 1 drop into both eyes daily as needed (dry eyes).     nitroGLYCERIN (NITROSTAT) 0.4 MG SL tablet Place 1 tablet (0.4 mg total) under the tongue every 5 (five) minutes as needed for chest pain. PLACE ONE TABLET UNDER THE TONGUE EVERY 5 MINUTES FOR 3 DOSES AS NEEDED Strength: 0.4 mg 25 tablet 3   polyethylene glycol (MIRALAX / GLYCOLAX) 17 g packet Take 17 g by mouth daily. 14 each 0   promethazine (PHENERGAN) 25 MG suppository Place 25 mg rectally every 12 (twelve) hours as needed for nausea or vomiting.     RESTASIS 0.05 % ophthalmic emulsion Place 1 drop into both eyes 2 (two) times daily.     senna (SENOKOT) 8.6 MG TABS tablet Take 2 tablets (17.2 mg total) by mouth 2 (two) times daily. (Patient not taking: Reported on 11/28/2022) 120 tablet 0   sitaGLIPtin (JANUVIA) 25 MG tablet Take 25 mg by mouth daily.     TRUE METRIX BLOOD GLUCOSE TEST test strip USE TO TEST BLOOD SUGAR UP TO TWICE DAILY OR AS DIRECTED 180 each 3      Allergies  Allergen Reactions   Metformin Nausea And Vomiting   Niacin    Niacin And Related Other (See Comments)    Reaction: flushing and burning side effects even with extended release     ROS:  A comprehensive review of systems was negative except for: Cardiovascular: positive for EKG changes  Gastrointestinal: positive for nausea and vomiting Genitourinary: positive for treating for UTI currently Musculoskeletal: positive for weak, deconditioned, bedsore on back, not ambulating  Blood pressure 135/71, pulse 80, temperature 97.8 F (36.6 C), temperature source Oral,  resp. rate 16, SpO2 98 %. Physical Exam Vitals reviewed.  HENT:     Head: Normocephalic.  Eyes:     Extraocular Movements: Extraocular movements intact.  Cardiovascular:     Rate and Rhythm: Normal rate.  Pulmonary:     Effort: Pulmonary effort is normal.  Abdominal:     General: There is no distension.     Palpations: Abdomen is soft.     Tenderness: There is no abdominal tenderness.   Genitourinary:    Comments: Stage II-III sacral wound left of midline, 3cm in size, necrotic tissue at base  Musculoskeletal:        General: No swelling.  Skin:    General: Skin is warm.  Neurological:     General: No focal deficit present.     Mental Status: He is alert and oriented to person, place, and time.  Psychiatric:        Mood and Affect: Mood normal.        Behavior: Behavior normal.     Results: Results for orders placed or performed during the hospital encounter of 12/12/22 (from the past 48 hour(s))  Basic metabolic panel     Status: Abnormal   Collection Time: 12/12/22  6:37 AM  Result Value Ref Range   Sodium 138 135 - 145 mmol/L   Potassium 2.8 (L) 3.5 - 5.1 mmol/L   Chloride 96 (L) 98 - 111 mmol/L   CO2 29 22 - 32 mmol/L   Glucose, Bld 119 (H) 70 - 99 mg/dL    Comment: Glucose reference range applies only to samples taken after fasting for at least 8 hours.   BUN 13 8 - 23 mg/dL   Creatinine, Ser 3.09 (H) 0.61 - 1.24 mg/dL   Calcium 9.3 8.9 - 10.3 mg/dL   GFR, Estimated 20 (L) >60 mL/min    Comment: (NOTE) Calculated using the CKD-EPI Creatinine Equation (2021)    Anion gap 13 5 - 15    Comment: Performed at [facility], [address], [place], [place] [address]  Hemoglobin and hematocrit, blood     Status: Abnormal   Collection Time: 12/12/22  6:37 AM  Result Value Ref Range   Hemoglobin 8.6 (L) 13.0 - 17.0 g/dL   HCT 27.8 (L) 39.0 - 52.0 %    Comment: Performed at [facility], [address], [place], [place] [address]      Assessment & Plan:  [name] is a 76 y.o. male with multiple medical issues including ESRD, h/o CHF and prior acute cholecystitis, and cholecystostomy tube that fell out after the cystid duct re-cannulated. He continues to have nausea but this again may not be from the gallbladder as he has other things going on currently.   He and his family really wanted to try for cholecystectomy when I saw him in November, but he  looks weaker today, has been at home and had a worsening bedsore. Discussed that his nausea could be from the gallbladder but that we do not want to put him through surgery and hurt him or kill him given his EKG changes and concerning signs today.  I discussed that he will need a SNF again I am sure whether he has surgery or not.   Hospitalist admission, Cardiology to weigh in and nephrology to see. We determine if he can be  tuned up and see if he is still a candidate or not for surgery based on all of their thoughts and recommendations.   Sacral  wound- medihoney and dressing orders placed   All questions were answered to the satisfaction of the patient and family.    Virl Cagey 12/12/2022, 7:41 AM

## 2022-12-12 NOTE — Consult Note (Signed)
WOC Nurse Consult Note: Reason for Consult: sacral wound Wound type: Unstageable Pressure injury Pressure Injury POA: Yes Dr. Constance Haw has seen this patient for consultation for acute cholecystitis and is noted to have assessed the pressure injury and provided topical care. Patient is a poor surgical candidate therefore autolytic debridement with Medihoney has been ordered.  I have update the orders for the nursing staff and add the need for a low air loss mattress for moisture management and pressure redistribution.   Re consult if needed, will not follow at this time. Thanks  Solana Coggin [employer], RN,CWOCN, CNS, [place] [phone])

## 2022-12-13 ENCOUNTER — Encounter (HOSPITAL_COMMUNITY)
Admission: RE | Disposition: A | Discharge status: Skilled Nursing Facility | Payer: Self-pay | Source: Home / Self Care | Attending: Family Medicine

## 2022-12-13 DIAGNOSIS — F418 Other specified anxiety disorders: Secondary | ICD-10-CM | POA: Diagnosis not present

## 2022-12-13 DIAGNOSIS — K811 Chronic cholecystitis: Secondary | ICD-10-CM | POA: Diagnosis not present

## 2022-12-13 DIAGNOSIS — I10 Essential (primary) hypertension: Secondary | ICD-10-CM | POA: Diagnosis not present

## 2022-12-13 DIAGNOSIS — I5020 Unspecified systolic (congestive) heart failure: Secondary | ICD-10-CM | POA: Diagnosis not present

## 2022-12-13 DIAGNOSIS — Z992 Dependence on renal dialysis: Secondary | ICD-10-CM | POA: Diagnosis not present

## 2022-12-13 DIAGNOSIS — D649 Anemia, unspecified: Secondary | ICD-10-CM | POA: Diagnosis not present

## 2022-12-13 DIAGNOSIS — E1122 Type 2 diabetes mellitus with diabetic chronic kidney disease: Secondary | ICD-10-CM | POA: Diagnosis present

## 2022-12-13 DIAGNOSIS — K8010 Calculus of gallbladder with chronic cholecystitis without obstruction: Secondary | ICD-10-CM | POA: Diagnosis not present

## 2022-12-13 DIAGNOSIS — Z1611 Resistance to penicillins: Secondary | ICD-10-CM | POA: Diagnosis present

## 2022-12-13 DIAGNOSIS — L89153 Pressure ulcer of sacral region, stage 3: Secondary | ICD-10-CM | POA: Diagnosis not present

## 2022-12-13 DIAGNOSIS — I132 Hypertensive heart and chronic kidney disease with heart failure and with stage 5 chronic kidney disease, or end stage renal disease: Secondary | ICD-10-CM | POA: Diagnosis present

## 2022-12-13 DIAGNOSIS — I5022 Chronic systolic (congestive) heart failure: Secondary | ICD-10-CM

## 2022-12-13 DIAGNOSIS — I4891 Unspecified atrial fibrillation: Secondary | ICD-10-CM | POA: Diagnosis not present

## 2022-12-13 DIAGNOSIS — I509 Heart failure, unspecified: Secondary | ICD-10-CM | POA: Diagnosis not present

## 2022-12-13 DIAGNOSIS — E876 Hypokalemia: Secondary | ICD-10-CM | POA: Diagnosis not present

## 2022-12-13 DIAGNOSIS — R531 Weakness: Secondary | ICD-10-CM | POA: Diagnosis not present

## 2022-12-13 DIAGNOSIS — E669 Obesity, unspecified: Secondary | ICD-10-CM | POA: Diagnosis present

## 2022-12-13 DIAGNOSIS — L89150 Pressure ulcer of sacral region, unstageable: Secondary | ICD-10-CM | POA: Diagnosis present

## 2022-12-13 DIAGNOSIS — K5150 Left sided colitis without complications: Secondary | ICD-10-CM | POA: Diagnosis present

## 2022-12-13 DIAGNOSIS — I959 Hypotension, unspecified: Secondary | ICD-10-CM | POA: Diagnosis not present

## 2022-12-13 DIAGNOSIS — L89159 Pressure ulcer of sacral region, unspecified stage: Secondary | ICD-10-CM | POA: Diagnosis not present

## 2022-12-13 DIAGNOSIS — R630 Anorexia: Secondary | ICD-10-CM | POA: Diagnosis not present

## 2022-12-13 DIAGNOSIS — R110 Nausea: Secondary | ICD-10-CM | POA: Diagnosis not present

## 2022-12-13 DIAGNOSIS — Z515 Encounter for palliative care: Secondary | ICD-10-CM | POA: Diagnosis not present

## 2022-12-13 DIAGNOSIS — R9431 Abnormal electrocardiogram [ECG] [EKG]: Secondary | ICD-10-CM | POA: Diagnosis present

## 2022-12-13 DIAGNOSIS — N186 End stage renal disease: Secondary | ICD-10-CM | POA: Diagnosis present

## 2022-12-13 DIAGNOSIS — R532 Functional quadriplegia: Secondary | ICD-10-CM | POA: Diagnosis present

## 2022-12-13 DIAGNOSIS — K812 Acute cholecystitis with chronic cholecystitis: Secondary | ICD-10-CM | POA: Diagnosis not present

## 2022-12-13 DIAGNOSIS — K8066 Calculus of gallbladder and bile duct with acute and chronic cholecystitis without obstruction: Secondary | ICD-10-CM | POA: Diagnosis not present

## 2022-12-13 DIAGNOSIS — L89154 Pressure ulcer of sacral region, stage 4: Secondary | ICD-10-CM | POA: Diagnosis present

## 2022-12-13 DIAGNOSIS — E1140 Type 2 diabetes mellitus with diabetic neuropathy, unspecified: Secondary | ICD-10-CM | POA: Diagnosis present

## 2022-12-13 DIAGNOSIS — D631 Anemia in chronic kidney disease: Secondary | ICD-10-CM | POA: Diagnosis present

## 2022-12-13 DIAGNOSIS — K8012 Calculus of gallbladder with acute and chronic cholecystitis without obstruction: Secondary | ICD-10-CM | POA: Diagnosis present

## 2022-12-13 DIAGNOSIS — E871 Hypo-osmolality and hyponatremia: Secondary | ICD-10-CM | POA: Diagnosis not present

## 2022-12-13 DIAGNOSIS — L0231 Cutaneous abscess of buttock: Secondary | ICD-10-CM | POA: Diagnosis present

## 2022-12-13 DIAGNOSIS — I2510 Atherosclerotic heart disease of native coronary artery without angina pectoris: Secondary | ICD-10-CM

## 2022-12-13 DIAGNOSIS — R509 Fever, unspecified: Secondary | ICD-10-CM | POA: Diagnosis not present

## 2022-12-13 DIAGNOSIS — E782 Mixed hyperlipidemia: Secondary | ICD-10-CM | POA: Diagnosis present

## 2022-12-13 DIAGNOSIS — I5042 Chronic combined systolic (congestive) and diastolic (congestive) heart failure: Secondary | ICD-10-CM | POA: Diagnosis present

## 2022-12-13 DIAGNOSIS — K559 Vascular disorder of intestine, unspecified: Secondary | ICD-10-CM | POA: Diagnosis present

## 2022-12-13 DIAGNOSIS — K82A1 Gangrene of gallbladder in cholecystitis: Secondary | ICD-10-CM | POA: Diagnosis not present

## 2022-12-13 DIAGNOSIS — K819 Cholecystitis, unspecified: Secondary | ICD-10-CM | POA: Diagnosis not present

## 2022-12-13 DIAGNOSIS — Z7189 Other specified counseling: Secondary | ICD-10-CM | POA: Diagnosis not present

## 2022-12-13 DIAGNOSIS — Z87891 Personal history of nicotine dependence: Secondary | ICD-10-CM | POA: Diagnosis not present

## 2022-12-13 DIAGNOSIS — E1169 Type 2 diabetes mellitus with other specified complication: Secondary | ICD-10-CM | POA: Diagnosis not present

## 2022-12-13 DIAGNOSIS — Z4682 Encounter for fitting and adjustment of non-vascular catheter: Secondary | ICD-10-CM | POA: Diagnosis not present

## 2022-12-13 DIAGNOSIS — A419 Sepsis, unspecified organism: Secondary | ICD-10-CM | POA: Diagnosis not present

## 2022-12-13 DIAGNOSIS — E440 Moderate protein-calorie malnutrition: Secondary | ICD-10-CM | POA: Diagnosis not present

## 2022-12-13 DIAGNOSIS — I480 Paroxysmal atrial fibrillation: Secondary | ICD-10-CM | POA: Diagnosis present

## 2022-12-13 DIAGNOSIS — E119 Type 2 diabetes mellitus without complications: Secondary | ICD-10-CM | POA: Diagnosis not present

## 2022-12-13 DIAGNOSIS — E43 Unspecified severe protein-calorie malnutrition: Secondary | ICD-10-CM | POA: Diagnosis present

## 2022-12-13 DIAGNOSIS — I429 Cardiomyopathy, unspecified: Secondary | ICD-10-CM | POA: Diagnosis present

## 2022-12-13 DIAGNOSIS — N281 Cyst of kidney, acquired: Secondary | ICD-10-CM | POA: Diagnosis not present

## 2022-12-13 DIAGNOSIS — I472 Ventricular tachycardia, unspecified: Secondary | ICD-10-CM | POA: Diagnosis present

## 2022-12-13 LAB — CBC
HCT: 20.8 % — ABNORMAL LOW (ref 39.0–52.0)
HCT: 25.2 % — ABNORMAL LOW (ref 39.0–52.0)
Hemoglobin: 6.5 g/dL — CL (ref 13.0–17.0)
Hemoglobin: 8.1 g/dL — ABNORMAL LOW (ref 13.0–17.0)
MCH: 32.8 pg (ref 26.0–34.0)
MCH: 32.1 pg (ref 26.0–34.0)
MCHC: 31.3 g/dL (ref 30.0–36.0)
MCHC: 32.1 g/dL (ref 30.0–36.0)
MCV: 105.1 fL — ABNORMAL HIGH (ref 80.0–100.0)
MCV: 100 fL (ref 80.0–100.0)
Platelets: 192 10*3/uL (ref 150–400)
Platelets: 172 10*3/uL (ref 150–400)
RBC: 1.98 MIL/uL — ABNORMAL LOW (ref 4.22–5.81)
RBC: 2.52 MIL/uL — ABNORMAL LOW (ref 4.22–5.81)
RDW: 16.4 % — ABNORMAL HIGH (ref 11.5–15.5)
RDW: 20.6 % — ABNORMAL HIGH (ref 11.5–15.5)
WBC: 7.6 10*3/uL (ref 4.0–10.5)
WBC: 7.9 10*3/uL (ref 4.0–10.5)
nRBC: 0 % (ref 0.0–0.2)
nRBC: 0 % (ref 0.0–0.2)

## 2022-12-13 LAB — RENAL FUNCTION PANEL
Albumin: 2.2 g/dL — ABNORMAL LOW (ref 3.5–5.0)
Anion gap: 9 (ref 5–15)
BUN: 16 mg/dL (ref 8–23)
CO2: 30 mmol/L (ref 22–32)
Calcium: 9 mg/dL (ref 8.9–10.3)
Chloride: 101 mmol/L (ref 98–111)
Creatinine, Ser: 3.61 mg/dL — ABNORMAL HIGH (ref 0.61–1.24)
GFR, Estimated: 17 mL/min — ABNORMAL LOW (ref >60)
Glucose, Bld: 83 mg/dL (ref 70–99)
Phosphorus: 4 mg/dL (ref 2.5–4.6)
Potassium: 3.2 mmol/L — ABNORMAL LOW (ref 3.5–5.1)
Sodium: 140 mmol/L (ref 135–145)

## 2022-12-13 LAB — BASIC METABOLIC PANEL
Anion gap: 8 (ref 5–15)
BUN: 8 mg/dL (ref 8–23)
CO2: 27 mmol/L (ref 22–32)
Calcium: 8.3 mg/dL — ABNORMAL LOW (ref 8.9–10.3)
Chloride: 102 mmol/L (ref 98–111)
Creatinine, Ser: 2.11 mg/dL — ABNORMAL HIGH (ref 0.61–1.24)
GFR, Estimated: 32 mL/min — ABNORMAL LOW (ref >60)
Glucose, Bld: 121 mg/dL — ABNORMAL HIGH (ref 70–99)
Potassium: 3.8 mmol/L (ref 3.5–5.1)
Sodium: 137 mmol/L (ref 135–145)

## 2022-12-13 LAB — GLUCOSE, CAPILLARY
Glucose-Capillary: 88 mg/dL (ref 70–99)
Glucose-Capillary: 85 mg/dL (ref 70–99)
Glucose-Capillary: 122 mg/dL — ABNORMAL HIGH (ref 70–99)

## 2022-12-13 LAB — HEPATITIS B SURFACE ANTIGEN: Hepatitis B Surface Ag: NONREACTIVE

## 2022-12-13 LAB — MAGNESIUM
Magnesium: 2.2 mg/dL (ref 1.7–2.4)
Magnesium: 1.9 mg/dL (ref 1.7–2.4)

## 2022-12-13 LAB — TROPONIN I (HIGH SENSITIVITY): Troponin I (High Sensitivity): 30 ng/L — ABNORMAL HIGH (ref <18)

## 2022-12-13 SURGERY — LAPAROSCOPIC CHOLECYSTECTOMY
Anesthesia: General

## 2022-12-13 MED ORDER — SODIUM CHLORIDE 0.9 % IV SOLN
250.0000 mg | Freq: Every day | INTRAVENOUS | Status: AC
  Administered 2022-12-13 – 2022-12-16 (×4): 250 mg via INTRAVENOUS
  Filled 2022-12-13: qty 20
  Filled 2022-12-13: qty 250
  Filled 2022-12-13: qty 20
  Filled 2022-12-13: qty 250

## 2022-12-13 MED ORDER — DARBEPOETIN ALFA 200 MCG/0.4ML IJ SOSY
PREFILLED_SYRINGE | INTRAMUSCULAR | Status: AC
  Administered 2022-12-13: 200 ug via SUBCUTANEOUS
  Filled 2022-12-13: qty 0.4

## 2022-12-13 MED ORDER — SIMETHICONE 80 MG PO CHEW
80.0000 mg | CHEWABLE_TABLET | Freq: Four times a day (QID) | ORAL | Status: DC | PRN
  Administered 2022-12-13 – 2023-01-11 (×3): 80 mg via ORAL
  Filled 2022-12-13 (×3): qty 1

## 2022-12-13 MED ORDER — ALTEPLASE 2 MG IJ SOLR: 2.0000 mg | Freq: Once | INTRAMUSCULAR | Status: AC | PRN

## 2022-12-13 MED ORDER — POTASSIUM CHLORIDE 10 MEQ/100ML IV SOLN
10.0000 meq | INTRAVENOUS | Status: AC
  Administered 2022-12-13 (×4): 10 meq via INTRAVENOUS
  Filled 2022-12-13 (×4): qty 100

## 2022-12-13 MED ORDER — SODIUM CHLORIDE 0.9% IV SOLUTION: Freq: Once | INTRAVENOUS | Status: AC

## 2022-12-13 MED ORDER — HEPARIN SODIUM (PORCINE) 1000 UNIT/ML DIALYSIS
1000.0000 [IU] | INTRAMUSCULAR | Status: DC | PRN
  Administered 2022-12-18: 1000 [IU]
  Administered 2022-12-22 – 2023-01-10 (×3): 3800 [IU]
  Filled 2022-12-13 (×18): qty 1

## 2022-12-13 MED ORDER — BUPIVACAINE LIPOSOME 1.3 % IJ SUSP
INTRAMUSCULAR | Status: AC
  Filled 2022-12-13: qty 20

## 2022-12-13 MED ORDER — HEPARIN SODIUM (PORCINE) 1000 UNIT/ML IJ SOLN
INTRAMUSCULAR | Status: AC
  Administered 2022-12-13: 3800 [IU]
  Filled 2022-12-13: qty 6

## 2022-12-13 MED ORDER — CHLORHEXIDINE GLUCONATE CLOTH 2 % EX PADS
6.0000 | MEDICATED_PAD | Freq: Every day | CUTANEOUS | Status: DC
  Administered 2022-12-13 – 2022-12-19 (×5): 6 via TOPICAL

## 2022-12-13 MED ORDER — DARBEPOETIN ALFA 200 MCG/0.4ML IJ SOSY
200.0000 ug | PREFILLED_SYRINGE | Freq: Once | INTRAMUSCULAR | Status: AC
  Filled 2022-12-13: qty 0.4

## 2022-12-13 MED ORDER — CHLORHEXIDINE GLUCONATE CLOTH 2 % EX PADS
6.0000 | MEDICATED_PAD | Freq: Every day | CUTANEOUS | Status: DC
  Administered 2022-12-13 – 2022-12-18 (×5): 6 via TOPICAL

## 2022-12-13 MED ORDER — ISOSORBIDE DINITRATE 10 MG PO TABS
10.0000 mg | ORAL_TABLET | Freq: Two times a day (BID) | ORAL | Status: DC
  Administered 2022-12-13 – 2022-12-19 (×11): 10 mg via ORAL
  Filled 2022-12-13 (×14): qty 1

## 2022-12-13 NOTE — TOC Initial Note (Signed)
Transition of Care [facility]) - Initial/Assessment Note    Patient Details  Name: [name] MRN: [id number] Date of Birth: 02/17/1946  [facility] CM/SW Contact:    Shade Flood, LCSW Phone Number: 12/13/2022, 1:16 PM  Clinical Narrative:                  Pt admitted from home. He is known to [facility] from previous admissions. Pt receives outpatient HD MWF at [facility]. PT/OT recommending SNF rehab at dc. Per MD, pt will transfer to [facility] for hearth cath when stable.   Met with pt to review dc planning. Pt agreeable to SNF referrals. He has been at [facility] and [facility] previously. CMS provider options reviewed with pt. Will review again when bed offers available.  TOC will start insurance authorization when pt closer to being stable for dc.  Expected Discharge Plan: Skilled Nursing Facility Barriers to Discharge: Continued Medical Work up   Patient Goals and CMS Choice Patient states their goals for this hospitalization and ongoing recovery are:: get better CMS Medicare.gov Compare Post Acute Care list provided to:: Patient Choice offered to / list presented to : Patient      Expected Discharge Plan and Services In-house Referral: Clinical Social Work   Post Acute Care Choice: [place] Living arrangements for the past 2 months: [place]                                      Prior Living Arrangements/Services Living arrangements for the past 2 months: Single Family Home Lives with:: Relatives Patient language and need for interpreter reviewed:: Yes Do you feel safe going back to the place where you live?: Yes      Need for Family Participation in Patient Care: Yes (Comment) Care giver support system in place?: Yes (comment) Current home services: DME Criminal Activity/Legal Involvement Pertinent to Current Situation/Hospitalization: No - Comment as needed  Activities of Daily Living      Permission  Sought/Granted Permission sought to share information with : Facility Art therapist granted to share information with : Yes, Verbal Permission Granted     Permission granted to share info w AGENCY: snfs        Emotional Assessment Appearance:: Appears stated age Attitude/Demeanor/Rapport: Engaged Affect (typically observed): Pleasant Orientation: : Oriented to Self, Oriented to Place, Oriented to  Time, Oriented to Situation Alcohol / Substance Use: Not Applicable Psych Involvement: No (comment)  Admission diagnosis:  Nausea and vomiting [R11.2] Patient Active Problem List   Diagnosis Date Noted   Nausea and vomiting 12/12/2022   Pressure injury of skin of sacral region 12/12/2022   Prolonged QT interval 12/12/2022   Generalized weakness 12/12/2022   Pressure injury of sacral region, stage 1 09/24/2022   Hypotension    Sepsis ([place]) 09/13/2022   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   ESRD (end stage renal disease) ([place]) 08/07/2022   HFrEF (heart failure with reduced ejection fraction) ([place])    (HFpEF) heart failure with preserved ejection fraction ([place]) 07/29/2022   Hypokalemia 07/29/2022   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   Acute respiratory failure with hypoxia ([place])    B12 deficiency 07/18/2022   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/2022   Diabetic neuropathy ([place]) 05/10/2022   Elevated troponin 05/10/2022   Type 2 diabetes mellitus with hyperglycemia ([place]) 05/10/2022   Other intervertebral disc degeneration, lumbar region 02/06/2022  Chronic gout, unspecified, 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ophi) 02/06/2022   Body mass index (BMI) 35.0-35.9, adult 02/06/2022   Obesity 02/06/2022   Primary generalized (osteo)arthritis 02/06/2022   Chest pain 02/06/2022   Lumbago with sciatica, right side 08/28/2021   Weakness of both lower extremities 08/28/2021   Body mass index (BMI) 33.0-33.9, adult 08/28/2021   Constipation 02/15/2021   Abdominal pain 01/02/2021   Diabetes  mellitus ([place]) 11/09/2020   Hypertensive disorder 11/09/2020   Hypercholesterolemia 11/09/2020   H/O adenomatous polyp of colon 05/05/2020   Iron deficiency anemia 05/05/2020   Chronic kidney disease, stage IV (severe) ([place]) 12/14/2019   Acute gouty arthritis 10/25/2019   Leukocytosis 04/02/2019   Cellulitis of right hand 04/01/2019   Osteoarthritis of ankle and foot 02/08/2019   Heart failure with improved ejection fraction (HFimpEF) ([place]) 07/05/2015   Acute congestive heart failure ([place]) 07/04/2015   Acute on chronic diastolic congestive heart failure ([place]) 07/04/2015   Mixed hyperlipidemia 07/04/2015   Anxiety state 07/04/2015   GERD (gastroesophageal reflux disease) 07/04/2015   Heart block 07/04/2015   Gastroesophageal reflux disease 52/59/1028   Diastolic CHF, chronic (HCC) 03/11/2015   Chronic diastolic heart failure ([place]) 03/11/2015   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   Knee stiffness 01/13/2014   Gout 12/11/2013   Expected blood loss anemia 11/17/2013   S/P right TKA 11/15/2013   Anemia, normocytic normochromic 01/12/2013   Acute kidney injury superimposed on chronic kidney disease ([place]) 12/10/2012   Diabetes mellitus type 2 in obese ([place]) 12/10/2012   Coronary artery disease involving native coronary artery of native heart without angina pectoris 11/24/2012   Overweight(278.02) 11/17/2012   Chronic kidney disease, stage 3, cardiorenal syndrome 08/23/2011   Chronic kidney disease, stage 3 unspecified ([place]) 08/23/2011   Essential hypertension    PCP:  Redmond School, MD Pharmacy:   [place], [place] [address] Phone: [phone] Fax: [phone]  [facility] [address] [place] [address] Phone: [phone] Fax: [phone]     Social Determinants of Health (SDOH) Social History: SDOH Screenings   Food Insecurity: No Food Insecurity (09/13/2022)  Housing: Low Risk   (09/13/2022)  Transportation Needs: No Transportation Needs (09/13/2022)  Utilities: Not At Risk (09/13/2022)  Tobacco Use: Medium Risk (12/12/2022)   SDOH Interventions:     Readmission Risk Interventions    09/16/2022   12:31 PM 08/02/2022    2:23 PM  Readmission Risk Prevention Plan  Transportation Screening Complete Complete  Medication Review (RN Care Manager) Complete Complete  HRI or Home Care Consult Complete Complete  SW Recovery Care/Counseling Consult Complete Complete  Palliative Care Screening Not Applicable Not Applicable  [place] Not Applicable Complete

## 2022-12-13 NOTE — Progress Notes (Signed)
[facility] Progress Note  1 Day Post-Op  Subjective: EKG changes continue and anemia this AM. Getting transfused. Cardiology thinks he may need a catheterization. Surgery canceled. Appreciate everyone's assistance with this complicated patient that has been getting sicker since I first met him.  Cards had cleared him in October but EKG changes and noted yesterday prompted admission and workup and canceled case.   He is understanding.   Objective: Vital signs in last 24 hours: Temp:  [97.6 F (36.4 C)-97.7 F (36.5 C)] 97.7 F (36.5 C) (12/22 0331) Pulse Rate:  [67-77] 67 (12/22 0331) Resp:  [17-18] 18 (12/22 0331) BP: (100-150)/(42-62) 100/42 (12/22 0331) SpO2:  [97 %-100 %] 99 % (12/22 0331) Last BM Date : 12/12/22  Intake/Output from previous day: 12/21 0701 - 12/22 0700 In: 278.3 [P.O.:120; IV Piggyback:158.3] Out: -  Intake/Output this shift: No intake/output data recorded.  General appearance: alert and no distress Resp: normal work of breathing  Lab Results:  Recent Labs    12/12/22 0643 12/13/22 0431  WBC 10.4 7.6  HGB 8.5* 6.5*  HCT 27.9* 20.8*  PLT 231 192   BMET Recent Labs    12/12/22 0637 12/13/22 0431  NA 138 140  K 2.8* 3.2*  CL 96* 101  CO2 29 30  GLUCOSE 119* 83  BUN 13 16  CREATININE 3.09* 3.61*  CALCIUM 9.3 9.0   PT/INR No results for input(s): "LABPROT", "INR" in the last 72 hours.  Studies/Results: ECHOCARDIOGRAM LIMITED  Result Date: 12/12/2022    ECHOCARDIOGRAM LIMITED REPORT   Patient Name:   ABDULRAHMAN BRACEY Date of Exam: 12/12/2022 Medical Rec #:  [id number]       Height:       74.0 in Accession #:    [id number]      Weight:       192.0 lb Date of Birth:  1946-06-04       BSA:          2.136 m Patient Age:    76 years        BP:           135/71 mmHg Patient Gender: M               HR:           80 bpm. Exam Location:  [facility] Procedure: Limited Echo, Cardiac Doppler and Limited Color Doppler Indications:     Abnormal EKG R94.31  History:        Patient has prior history of Echocardiogram examinations, most                 recent 07/29/2022. Risk Factors:Hypertension, Diabetes and                 Dyslipidemia.  Sonographer:    Alvino Chapel RCS Referring Phys: [place]  1. Limited Echo to evaluate LVEF  2. Left ventricular ejection fraction, by estimation, is 50 to 55%. The left ventricle has low normal function. The left ventricle has no regional wall motion abnormalities. Left ventricular diastolic function could not be evaluated.  3. The mitral valve is grossly normal. Mild mitral valve regurgitation. No evidence of mitral stenosis.  4. The inferior vena cava is normal in size with greater than 50% respiratory variability, suggesting right atrial pressure of 3 mmHg. Comparison(s): Changes from prior study are noted. LVEF improved from 35-40% in 07/2022. FINDINGS  Left Ventricle: Left ventricular ejection fraction, by estimation, is 50 to  55%. The left ventricle has low normal function. The left ventricle has no regional wall motion abnormalities. The left ventricular internal cavity size was normal in size. There is no left ventricular hypertrophy. Left ventricular diastolic function could not be evaluated. Left ventricular diastolic function could not be evaluated due to abnormal septal motion. Left Atrium: Left atrial size was normal in size. Right Atrium: Right atrial size was normal in size. Pericardium: There is no evidence of pericardial effusion. Mitral Valve: The mitral valve is grossly normal. Mild mitral valve regurgitation. No evidence of mitral valve stenosis. Tricuspid Valve: The tricuspid valve is not assessed. Tricuspid valve regurgitation is not demonstrated. No evidence of tricuspid stenosis. Aortic Valve: The aortic valve was not well visualized. There is mild calcification of the aortic valve. Pulmonic Valve: The pulmonic valve was not assessed. Pulmonic valve  regurgitation is not visualized. No evidence of pulmonic stenosis. Aorta: The aortic root is normal in size and structure. Venous: The inferior vena cava is normal in size with greater than 50% respiratory variability, suggesting right atrial pressure of 3 mmHg. IAS/Shunts: No atrial level shunt detected by color flow Doppler. LEFT VENTRICLE PLAX 2D LVIDd:         5.50 cm LVIDs:         4.30 cm LV PW:         1.30 cm LV IVS:        1.20 cm LVOT diam:     1.90 cm LVOT Area:     2.84 cm  LV Volumes (MOD) LV vol d, MOD A2C: 110.0 ml LV vol d, MOD A4C: 132.0 ml LV vol s, MOD A2C: 60.8 ml LV vol s, MOD A4C: 62.2 ml LV SV MOD A2C:     49.2 ml LV SV MOD A4C:     132.0 ml LV SV MOD BP:      63.8 ml LEFT ATRIUM         Index LA diam:    3.80 cm 1.78 cm/m   AORTA Ao Root diam: 3.60 cm MITRAL VALVE MV Area (PHT): 3.91 cm     SHUNTS MV Decel Time: 194 msec     Systemic Diam: 1.90 cm MV E velocity: 107.00 cm/s Vishnu Priya Mallipeddi Electronically signed by Lorelee Cover Mallipeddi Signature Date/Time: 12/12/2022/3:21:48 PM    Final     Anti-infectives: Anti-infectives (From admission, onward)    Start     Dose/Rate Route Frequency Ordered Stop   12/13/22 0600  cefoTEtan (CEFOTAN) 2 g in sodium chloride 0.9 % 100 mL IVPB        2 g 200 mL/hr over 30 Minutes Intravenous On call to O.R. 12/12/22 1631 12/14/22 0559   12/12/22 0615  cefoTEtan (CEFOTAN) 2 g in sodium chloride 0.9 % 100 mL IVPB  Status:  Discontinued        2 g 200 mL/hr over 30 Minutes Intravenous On call to O.R. 12/12/22 1771 12/12/22 0913       Assessment/Plan: Patient with chronic cholecystitis in the setting of prior acute cholecystitis in August s/p IR chole tube that fell out but known patent cystic duct on last IR imaging. He is currently not a candidate for any surgery given the need for further cardiac evaluation.    He is understanding.    LOS: 0 days    Virl Cagey 12/13/2022

## 2022-12-13 NOTE — Progress Notes (Signed)
UNMATCHED BLOOD PRODUCT NOTE  Blood product verified by Rockwell Alexandria, RN (dialysis nurse) and Butch Penny, RN (primary nurse) for transfusion during hemodialysis.   Blood Product Type: PACKED RED BLOOD CELLS  Unit #:  [id number]*  Product Code #: [id number]   Start Time: 0123   Starting Rate: 120 ml/hr  Rate increase/decreased  (if applicable):  799 ml/hr  Rate changed time (if applicable):  0940   Stop Time: 1225

## 2022-12-13 NOTE — Progress Notes (Signed)
Patient off the floor for hemo dialysis.

## 2022-12-13 NOTE — Progress Notes (Signed)
Patient's dressing to sacrum changed.

## 2022-12-13 NOTE — Evaluation (Signed)
Occupational Therapy Evaluation Patient Details Name: [name] MRN: [id number] DOB: 08/25/1946 Today's Date: 12/13/2022   History of Present Illness DANIAL HLAVAC is a 76 y.o. male with medical history significant of end-stage renal disease on hemodialysis, HFrEF with EF 35 to 40% in 07/2022, diabetes, hypertension, who has had issues with recurrent nausea.  He has had this over the last several months and it was initially felt to be related to cholecystitis.  Due to his high risk for surgery, he had cholecystostomy tube placed in the last few months.  Recently, his tube was dislodged and came out.  Surgery notes indicate that he had recanalized his bile duct and therefore tube was not replaced.  He had presented today for elective cholecystectomy.  Preop evaluation including EKG had noted some EKG changes including diffuse T wave inversions and prolonged QT.  Preop labs show that he has hypokalemia.  He reports continued issues with nausea and poor p.o. intake.  Does not really have vomiting or diarrhea.  Denies any recent chest pain or shortness of breath.  He has normal dialysis schedule is Monday, Wednesday, Friday and his last dialysis session was yesterday as scheduled.  He had a shortened session for 2 hours which she reports was ended because he had to have a bowel movement.  He was recently seen in the emergency room at the beginning of this month where he was noted to have trouble with walking and overall weakness.  He was offered skilled nursing facility placement for rehab, but he ultimately elected to go home.  Due to EKG changes and underlying cardiac issues, his surgery today was canceled.  I was contacted by Dr. Constance Haw regarding medical admission to optimize his cardiac status.  Cardiology consultation requested for preop evaluation and to optimize his cardiac status.  Nephrology also consulted for dialysis needs.   Clinical Impression   Pt agreeable to OT/PT evaluation. Pt was  scheduled for surgery today, but was cancelled due to unstable vitals/ EKG. Pt was able to complete bed mobility with HOB raised and min assist. Pt reports that he has a bed at home with ability to raise HOB. Pt was able to complete sit to stand t/f with min/mod assist and take side steps/forwards and backwards with min guard. Pt demonstrates slow and labored movement throughout session and demonstrated generalized weakness. Discussed current physical conditions with pt and he agreed that d/c to SNF would be beneficial due to amount of assistance needed verses assistance available at home.       Recommendations for follow up therapy are one component of a multi-disciplinary discharge planning process, led by the attending physician.  Recommendations may be updated based on patient status, additional functional criteria and insurance authorization.   Follow Up Recommendations  Skilled nursing-short term rehab (<3 hours/day)     Assistance Recommended at Discharge Frequent or constant Supervision/Assistance  Patient can return home with the following A little help with walking and/or transfers;A little help with bathing/dressing/bathroom    Functional Status Assessment  Patient has had a recent decline in their functional status and demonstrates the ability to make significant improvements in function in a reasonable and predictable amount of time.  Equipment Recommendations  None recommended by OT    Recommendations for Other Services       Precautions / Restrictions Precautions Precautions: Fall Restrictions Weight Bearing Restrictions: No      Mobility Bed Mobility Overal bed mobility: Needs Assistance Bed Mobility: Supine to Sit, Sit  to Supine Rolling:  (HOB elevated,)   Supine to sit: Min assist, HOB elevated     General bed mobility comments: Pt required HOB elevation ( he reports he has this bed function at home). Min assist to move LE to EOB and sit up     Transfers Overall transfer level: Needs assistance Equipment used: Rolling walker (2 wheels) Transfers: Sit to/from Stand Sit to Stand: Mod assist, Min assist     Step pivot transfers: Min assist, Mod assist     General transfer comment: patient able to stand by pushing off bed with bil UE then grabbing walker, counts to 3 before standing, min-mod assist due to bil LE weakness      Balance Overall balance assessment: Needs assistance Sitting-balance support: Feet supported, Bilateral upper extremity supported Sitting balance-Leahy Scale: Fair Sitting balance - Comments: fair/good seated EOB   Standing balance support: Reliant on assistive device for balance, During functional activity, Bilateral upper extremity supported Standing balance-Leahy Scale: Fair Standing balance comment: fair with RW                           ADL either performed or assessed with clinical judgement   ADL Overall ADL's : Needs assistance/impaired Eating/Feeding: Set up   Grooming: Set up   Upper Body Bathing: Minimal assistance   Lower Body Bathing: Moderate assistance   Upper Body Dressing : Minimal assistance   Lower Body Dressing: Moderate assistance   Toilet Transfer: Moderate assistance   Toileting- Clothing Manipulation and Hygiene: Moderate assistance   Tub/ Shower Transfer: Moderate assistance   Functional mobility during ADLs: Moderate assistance;Maximal assistance;Rolling walker (2 wheels)       Vision Baseline Vision/History: 0 No visual deficits Ability to See in Adequate Light: 0 Adequate Patient Visual Report: No change from baseline Vision Assessment?: No apparent visual deficits                Pertinent Vitals/Pain Pain Assessment Pain Assessment: No/denies pain     Hand Dominance Right   Extremity/Trunk Assessment Upper Extremity Assessment Upper Extremity Assessment: Generalized weakness       Cervical / Trunk Assessment Cervical /  Trunk Assessment: Normal   Communication Communication Communication: No difficulties   Cognition Arousal/Alertness: Awake/alert Behavior During Therapy: WFL for tasks assessed/performed Overall Cognitive Status: Within Functional Limits for tasks assessed                                                        Home Living Family/patient expects to be discharged to:: Private residence Living Arrangements: Other relatives Available Help at Discharge: Family;Available PRN/intermittently Type of Home: House Home Access: Stairs to enter [employer] of Steps: 3 Entrance Stairs-Rails: Right;Left;Can reach both Home Layout: One level     Bathroom Shower/Tub: Occupational psychologist: Standard Bathroom Accessibility: Yes How Accessible: Accessible via walker Home Equipment: Rolling Walker (2 wheels);Cane - single point;Grab bars - tub/shower;BSC/3in1   Additional Comments: Pt reports niece can stay with him, but unsure of accuracy      Prior Functioning/Environment Prior Level of Function : Needs assist       Physical Assist : Mobility (physical) Mobility (physical): Bed mobility;Transfers;Gait;Stairs   Mobility Comments: Household ambulator using RW ADLs Comments: assisted by family  OT Problem List: Decreased strength;Decreased activity tolerance;Impaired balance (sitting and/or standing)      OT Treatment/Interventions: Self-care/ADL training;Therapeutic exercise;DME and/or AE instruction;Therapeutic activities;Patient/family education    OT Goals(Current goals can be found in the care plan section) Acute Rehab OT Goals Patient Stated Goal: to go to rehab OT Goal Formulation: With patient Time For Goal Achievement: 12/27/22 Potential to Achieve Goals: Fair  OT Frequency: Min 1X/week    Co-evaluation PT/OT/SLP Co-Evaluation/Treatment: Yes Reason for Co-Treatment: To address functional/ADL transfers   OT goals  addressed during session: ADL's and self-care      AM-PAC OT "6 Clicks" Daily Activity     Outcome Measure Help from another person eating meals?: A Little Help from another person taking care of personal grooming?: A Little Help from another person toileting, which includes using toliet, bedpan, or urinal?: A Lot Help from another person bathing (including washing, rinsing, drying)?: A Lot Help from another person to put on and taking off regular upper body clothing?: A Little Help from another person to put on and taking off regular lower body clothing?: A Lot 6 Click Score: 15   End of Session Equipment Utilized During Treatment: Rolling walker (2 wheels) Nurse Communication: Mobility status  Activity Tolerance: Patient tolerated treatment well Patient left: in bed;with call bell/phone within reach  OT Visit Diagnosis: Unsteadiness on feet (R26.81);Muscle weakness (generalized) (M62.81)                Time: 5465-6812 OT Time Calculation (min): 22 min Charges:  OT General Charges $OT Visit: 1 Visit OT Evaluation $OT Eval Low Complexity: [place], OTR/L  12/13/2022, 9:50 AM

## 2022-12-13 NOTE — Evaluation (Signed)
Physical Therapy Evaluation Patient Details Name: [name] MRN: [id number] DOB: 1946-07-09 Today's Date: 12/13/2022  History of Present Illness  [name] is a 76 y.o. male with medical history significant of end-stage renal disease on hemodialysis, HFrEF with EF 35 to 40% in 07/2022, diabetes, hypertension, who has had issues with recurrent nausea.  He has had this over the last several months and it was initially felt to be related to cholecystitis.  Due to his high risk for surgery, he had cholecystostomy tube placed in the last few months.  Recently, his tube was dislodged and came out.  Surgery notes indicate that he had recanalized his bile duct and therefore tube was not replaced.  He had presented today for elective cholecystectomy.  Preop evaluation including EKG had noted some EKG changes including diffuse T wave inversions and prolonged QT.  Preop labs show that he has hypokalemia.  He reports continued issues with nausea and poor p.o. intake.  Does not really have vomiting or diarrhea.  Denies any recent chest pain or shortness of breath.  He has normal dialysis schedule is Monday, Wednesday, Friday and his last dialysis session was yesterday as scheduled.  He had a shortened session for 2 hours which she reports was ended because he had to have a bowel movement.  He was recently seen in the emergency room at the beginning of this month where he was noted to have trouble with walking and overall weakness.  He was offered skilled nursing facility placement for rehab, but he ultimately elected to go home.  Due to EKG changes and underlying cardiac issues, his surgery today was canceled.  I was contacted by Dr. Constance Haw regarding medical admission to optimize his cardiac status.  Cardiology consultation requested for preop evaluation and to optimize his cardiac status.  Nephrology also consulted for dialysis needs.   Clinical Impression  Patient demonstrates slow labored movement for  sitting up at bedside for completing supine to sitting and had most difficulty moving BLE due to weakness, able to take a few steps at bedside before having to sit due to c/o fatigue/generalized weakness and put back to bed after therapy mostly due to c/o mild dizziness with HGB at 6.5.  Patient will benefit from continued skilled physical therapy in hospital and recommended venue below to increase strength, balance, endurance for safe ADLs and gait.         Recommendations for follow up therapy are one component of a multi-disciplinary discharge planning process, led by the attending physician.  Recommendations may be updated based on patient status, additional functional criteria and insurance authorization.  Follow Up Recommendations Skilled nursing-short term rehab (<3 hours/day) Can patient physically be transported by private vehicle: Yes    Assistance Recommended at Discharge Set up Supervision/Assistance  Patient can return home with the following  A lot of help with walking and/or transfers;A lot of help with bathing/dressing/bathroom;Assistance with cooking/housework;Help with stairs or ramp for entrance    Equipment Recommendations None recommended by PT  Recommendations for Other Services       Functional Status Assessment Patient has had a recent decline in their functional status and demonstrates the ability to make significant improvements in function in a reasonable and predictable amount of time.     Precautions / Restrictions Precautions Precautions: Fall Restrictions Weight Bearing Restrictions: No      Mobility  Bed Mobility Overal bed mobility: Needs Assistance Bed Mobility: Supine to Sit, Sit to Supine  Supine to sit: Min assist, HOB elevated Sit to supine: Min assist, Mod assist   General bed mobility comments: slow labored movement with HOB raised, had difficulty moving legs onto bed during sit to supine    Transfers Overall transfer level: Needs  assistance Equipment used: Rolling walker (2 wheels) Transfers: Sit to/from Stand Sit to Stand: Mod assist, Min assist, From elevated surface           General transfer comment: required bed raised for completing sit to stands due to BLE weakness    Ambulation/Gait Ambulation/Gait assistance: Min assist, Mod assist Gait Distance (Feet): 10 Feet Assistive device: Rolling walker (2 wheels) Gait Pattern/deviations: Decreased step length - right, Decreased step length - left, Decreased stride length Gait velocity: decreased     General Gait Details: limited to a few side steps and steps forward/backward due to BLE weakness and c/o fatigue  Stairs            Wheelchair Mobility    Modified Rankin (Stroke Patients Only)       Balance Overall balance assessment: Needs assistance Sitting-balance support: Feet supported, No upper extremity supported Sitting balance-Leahy Scale: Fair Sitting balance - Comments: fair/good seated EOB   Standing balance support: Reliant on assistive device for balance, During functional activity, Bilateral upper extremity supported Standing balance-Leahy Scale: Fair Standing balance comment: using RW                             Pertinent Vitals/Pain Pain Assessment Pain Assessment: No/denies pain    Home Living Family/patient expects to be discharged to:: Private residence Living Arrangements: Other relatives Available Help at Discharge: Family;Available PRN/intermittently Type of Home: House Home Access: Stairs to enter Entrance Stairs-Rails: Right;Left;Can reach both Entrance Stairs-Number of Steps: 3   Home Layout: One level Home Equipment: Conservation officer, nature (2 wheels);Cane - single point;Grab bars - tub/shower;BSC/3in1      Prior Function Prior Level of Function : Needs assist  Cognitive Assist : Mobility (cognitive)     Physical Assist : Mobility (physical) Mobility (physical): Bed mobility;Transfers;Gait;Stairs    Mobility Comments: Household ambulator using RW ADLs Comments: assisted by family     Hand Dominance   Dominant Hand: Right    Extremity/Trunk Assessment   Upper Extremity Assessment Upper Extremity Assessment: Defer to OT evaluation    Lower Extremity Assessment Lower Extremity Assessment: Generalized weakness    Cervical / Trunk Assessment Cervical / Trunk Assessment: Normal  Communication   Communication: No difficulties  Cognition Arousal/Alertness: Awake/alert Behavior During Therapy: WFL for tasks assessed/performed Overall Cognitive Status: Within Functional Limits for tasks assessed                                          General Comments      Exercises     Assessment/Plan    PT Assessment Patient needs continued PT services  PT Problem List Decreased strength;Decreased activity tolerance;Decreased balance;Decreased mobility       PT Treatment Interventions DME instruction;Gait training;Stair training;Functional mobility training;Therapeutic activities;Therapeutic exercise;Balance training;Patient/family education    PT Goals (Current goals can be found in the Care Plan section)  Acute Rehab PT Goals Patient Stated Goal: return home with family to assist PT Goal Formulation: With patient Time For Goal Achievement: 12/27/22 Potential to Achieve Goals: Good    Frequency Min 3X/week  Co-evaluation PT/OT/SLP Co-Evaluation/Treatment: Yes Reason for Co-Treatment: To address functional/ADL transfers PT goals addressed during session: Mobility/safety with mobility;Balance;Proper use of DME         AM-PAC PT "6 Clicks" Mobility  Outcome Measure Help needed turning from your back to your side while in a flat bed without using bedrails?: A Little Help needed moving from lying on your back to sitting on the side of a flat bed without using bedrails?: A Lot Help needed moving to and from a bed to a chair (including a wheelchair)?: A  Little Help needed standing up from a chair using your arms (e.g., wheelchair or bedside chair)?: A Lot Help needed to walk in hospital room?: A Lot Help needed climbing 3-5 steps with a railing? : A Lot 6 Click Score: 14    End of Session   Activity Tolerance: Patient tolerated treatment well;Patient limited by fatigue Patient left: in bed;with call bell/phone within reach Nurse Communication: Mobility status PT Visit Diagnosis: Unsteadiness on feet (R26.81);Other abnormalities of gait and mobility (R26.89);Muscle weakness (generalized) (M62.81)    Time: 1025-8527 PT Time Calculation (min) (ACUTE ONLY): 31 min   Charges:   PT Evaluation $PT Eval Moderate Complexity: 1 Mod PT Treatments $Therapeutic Activity: 23-37 mins        12:08 PM, 12/13/22 [name], MPT Physical Therapist with [facility] 336 [phone] office [phone] mobile phone

## 2022-12-13 NOTE — Progress Notes (Signed)
RN made aware of critical test result on EKG for long QTC.

## 2022-12-13 NOTE — Progress Notes (Signed)
  Critical Lab value reported by the lab;  Contacted Dr. Nyra JabsOrlin Hilding    Collected: 12/13/22 0431  Result status: Final  Resulting lab: [facility]  Reference range: 13.0 - 17.0 g/dL  Value: 6.5 Low Panic   Comment: This critical result has verified and been called to [facility] by Eula Listen on 12 22 2023 at [phone], and has been read back.

## 2022-12-13 NOTE — NC FL2 (Deleted)
[place] LEVEL OF CARE FORM     IDENTIFICATION  Patient Name: [name] Birthdate: December 15, 1946 Sex: male Admission Date (Current Location): 12/12/2022  [facility] and [place] Number:  [employer] and Address:  [facility] [address], [place]      Provider Number: [id number]  Attending Physician Name and Address:  Kathie Dike, MD  Relative Name and Phone Number:       Current Level of Care: Hospital Recommended Level of Care: [place] Prior Approval Number:    Date Approved/Denied:   PASRR Number: [id number]  Discharge Plan: SNF    Current Diagnoses: Patient Active Problem List   Diagnosis Date Noted   Nausea and vomiting 12/12/2022   Pressure injury of skin of sacral region 12/12/2022   Prolonged QT interval 12/12/2022   Generalized weakness 12/12/2022   Pressure injury of sacral region, stage 1 09/24/2022   Hypotension    Sepsis ([place]) 09/13/2022   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   ESRD (end stage renal disease) ([place]) 08/07/2022   HFrEF (heart failure with reduced ejection fraction) ([place])    (HFpEF) heart failure with preserved ejection fraction ([place]) 07/29/2022   Hypokalemia 07/29/2022   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   Acute respiratory failure with hypoxia ([place])    B12 deficiency 07/18/2022   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/2022   Diabetic neuropathy ([place]) 05/10/2022   Elevated troponin 05/10/2022   Type 2 diabetes mellitus with hyperglycemia ([place]) 05/10/2022   Other intervertebral disc degeneration, lumbar region 02/06/2022   Chronic gout, unspecified, 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ophi) 02/06/2022   Body mass index (BMI) 35.0-35.9, adult 02/06/2022   Obesity 02/06/2022   Primary generalized (osteo)arthritis 02/06/2022   Chest pain 02/06/2022   Lumbago with sciatica, right side 08/28/2021   Weakness of both lower extremities 08/28/2021   Body mass index (BMI) 33.0-33.9, adult 08/28/2021    Constipation 02/15/2021   Abdominal pain 01/02/2021   Diabetes mellitus ([place]) 11/09/2020   Hypertensive disorder 11/09/2020   Hypercholesterolemia 11/09/2020   H/O adenomatous polyp of colon 05/05/2020   Iron deficiency anemia 05/05/2020   Chronic kidney disease, stage IV (severe) ([place]) 12/14/2019   Acute gouty arthritis 10/25/2019   Leukocytosis 04/02/2019   Cellulitis of right hand 04/01/2019   Osteoarthritis of ankle and foot 02/08/2019   Heart failure with improved ejection fraction (HFimpEF) ([place]) 07/05/2015   Acute congestive heart failure ([place]) 07/04/2015   Acute on chronic diastolic congestive heart failure ([place]) 07/04/2015   Mixed hyperlipidemia 07/04/2015   Anxiety state 07/04/2015   GERD (gastroesophageal reflux disease) 07/04/2015   Heart block 07/04/2015   Gastroesophageal reflux disease 47/82/9562   Diastolic CHF, chronic (HCC) 03/11/2015   Chronic diastolic heart failure ([place]) 03/11/2015   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   Knee stiffness 01/13/2014   Gout 12/11/2013   Expected blood loss anemia 11/17/2013   S/P right TKA 11/15/2013   Anemia, normocytic normochromic 01/12/2013   Acute kidney injury superimposed on chronic kidney disease ([place]) 12/10/2012   Diabetes mellitus type 2 in obese ([place]) 12/10/2012   Coronary artery disease involving native coronary artery of native heart without angina pectoris 11/24/2012   Overweight(278.02) 11/17/2012   Chronic kidney disease, stage 3, cardiorenal syndrome 08/23/2011   Chronic kidney disease, stage 3 unspecified ([place]) 08/23/2011   Essential hypertension     Orientation RESPIRATION BLADDER Height & Weight     Self, Time, Situation, Place  Normal Continent Weight:  (bedscale improperly calibrated) Height:  BEHAVIORAL SYMPTOMS/MOOD NEUROLOGICAL BOWEL NUTRITION STATUS      Continent Diet (see dc summary)  AMBULATORY STATUS COMMUNICATION OF NEEDS Skin   Extensive Assist Verbally Normal                        Personal Care Assistance Level of Assistance    Bathing Assistance: Maximum assistance Feeding assistance: Independent Dressing Assistance: Maximum assistance     Functional Limitations Info    Sight Info: Adequate Hearing Info: Adequate Speech Info: Adequate    SPECIAL CARE FACTORS FREQUENCY  PT (By licensed PT), OT (By licensed OT)     PT Frequency: 5x week OT Frequency: 3x week            Contractures Contractures Info: Not present    Additional Factors Info  Code Status, Allergies, Psychotropic Code Status Info: Full Allergies Info: Metformin, Niacin, Niacin related           Current Medications (12/13/2022):  This is the current hospital active medication list Current Facility-Administered Medications  Medication Dose Route Frequency Provider Last Rate Last Admin   acetaminophen (TYLENOL) tablet 650 mg  650 mg Oral Q6H PRN Kathie Dike, MD       Or   acetaminophen (TYLENOL) suppository 650 mg  650 mg Rectal Q6H PRN Kathie Dike, MD       allopurinol (ZYLOPRIM) tablet 100 mg  100 mg Oral Daily Memon, Jolaine Artist, MD       alteplase (CATHFLO ACTIVASE) injection 2 mg  2 mg Intracatheter Once PRN Corliss Parish, MD       cefoTEtan (CEFOTAN) 2 g in sodium chloride 0.9 % 100 mL IVPB  2 g Intravenous On Call to OR Virl Cagey, MD       Chlorhexidine Gluconate Cloth 2 % PADS 6 each  6 each Topical Daily Memon, Jolaine Artist, MD       Chlorhexidine Gluconate Cloth 2 % PADS 6 each  6 each Topical Q0600 Corliss Parish, MD       cycloSPORINE (RESTASIS) 0.05 % ophthalmic emulsion 1 drop  1 drop Both Eyes BID Kathie Dike, MD   1 drop at 12/12/22 2344   docusate sodium (COLACE) capsule 100 mg  100 mg Oral BID Kathie Dike, MD   100 mg at 12/12/22 2339   ferric gluconate (FERRLECIT) 250 mg in sodium chloride 0.9 % 250 mL IVPB  250 mg Intravenous Daily Corliss Parish, MD 135 mL/hr at 12/13/22 0939 250 mg at 12/13/22 0939   gabapentin (NEURONTIN)  capsule 300 mg  300 mg Oral QID Kathie Dike, MD   300 mg at 12/12/22 2340   heparin injection 1,000 Units  1,000 Units Intracatheter PRN Corliss Parish, MD   3,800 Units at 12/13/22 1513   hydrALAZINE (APRESOLINE) tablet 25 mg  25 mg Oral BID Corliss Parish, MD   25 mg at 12/12/22 2340   insulin aspart (novoLOG) injection 0-5 Units  0-5 Units Subcutaneous QHS Memon, Jolaine Artist, MD       insulin aspart (novoLOG) injection 0-9 Units  0-9 Units Subcutaneous TID WC Kathie Dike, MD   1 Units at 12/12/22 1659   isosorbide dinitrate (ISORDIL) tablet 10 mg  10 mg Oral BID Mallipeddi, Vishnu P, MD       leptospermum manuka honey (MEDIHONEY) paste 1 Application  1 Application Topical Daily Virl Cagey, MD       melatonin tablet 4.5 mg  4.5 mg Oral QHS Kathie Dike, MD   4.5  mg at 12/12/22 2341   nitroGLYCERIN (NITROSTAT) SL tablet 0.4 mg  0.4 mg Sublingual Q5 min PRN Kathie Dike, MD       polyethylene glycol (MIRALAX / GLYCOLAX) packet 17 g  17 g Oral Daily Memon, Jolaine Artist, MD       polyvinyl alcohol (LIQUIFILM TEARS) 1.4 % ophthalmic solution 1 drop  1 drop Both Eyes Daily PRN Kathie Dike, MD       pravastatin (PRAVACHOL) tablet 40 mg  40 mg Oral QPM Kathie Dike, MD   40 mg at 12/12/22 1659   traMADol (ULTRAM) tablet 50 mg  50 mg Oral QID PRN Kathie Dike, MD         Discharge Medications: Please see discharge summary for a list of discharge medications.  Relevant Imaging Results:  Relevant Lab Results:   Additional Information SSN: [place]  Shade Flood, LCSW

## 2022-12-13 NOTE — Procedures (Signed)
   HEMODIALYSIS TREATMENT NOTE:  Uneventful 3.5 hour heparin-free treatment completed using right IJ TDC.  Cath dressing was changed; exit site is unremarkable.   One unit pRBCs was transfused (see separate Unmatched Blood Product Note) after product verification by myself and primary nurse.  Darbepoetin 200 mcg was given.  Treatment was ended 6 minutes early due to rising venous pressure.  This ensured return of all blood.  Total run time: 3 hours, 24 minutes.  Net UF = 0.  Kept even, as ordered.  Hand-off given to Butch Penny, RN.   Rockwell Alexandria, RN

## 2022-12-13 NOTE — NC FL2 (Signed)
[place] LEVEL OF CARE FORM     IDENTIFICATION  Patient Name: [name] Birthdate: 1946-03-26 Sex: male Admission Date (Current Location): 12/12/2022  [facility] and [place] Number:  [employer] and Address:  [facility] [address] [place]      Provider Number: [id number]  Attending Physician Name and Address:  Kathie Dike, MD  Relative Name and Phone Number:       Current Level of Care: Hospital Recommended Level of Care: [place] Prior Approval Number:    Date Approved/Denied:   PASRR Number: [id number]  Discharge Plan: SNF    Current Diagnoses: Patient Active Problem List   Diagnosis Date Noted   Nausea and vomiting 12/12/2022   Pressure injury of skin of sacral region 12/12/2022   Prolonged QT interval 12/12/2022   Generalized weakness 12/12/2022   Pressure injury of sacral region, stage 1 09/24/2022   Hypotension    Sepsis ([place]) 09/13/2022   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   ESRD (end stage renal disease) ([place]) 08/07/2022   HFrEF (heart failure with reduced ejection fraction) ([place])    (HFpEF) heart failure with preserved ejection fraction ([place]) 07/29/2022   Hypokalemia 07/29/2022   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   Acute respiratory failure with hypoxia ([place])    B12 deficiency 07/18/2022   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/2022   Diabetic neuropathy ([place]) 05/10/2022   Elevated troponin 05/10/2022   Type 2 diabetes mellitus with hyperglycemia ([place]) 05/10/2022   Other intervertebral disc degeneration, lumbar region 02/06/2022   Chronic gout, unspecified, 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ophi) 02/06/2022   Body mass index (BMI) 35.0-35.9, adult 02/06/2022   Obesity 02/06/2022   Primary generalized (osteo)arthritis 02/06/2022   Chest pain 02/06/2022   Lumbago with sciatica, right side 08/28/2021   Weakness of both lower extremities 08/28/2021   Body mass index (BMI) 33.0-33.9, adult 08/28/2021    Constipation 02/15/2021   Abdominal pain 01/02/2021   Diabetes mellitus ([place]) 11/09/2020   Hypertensive disorder 11/09/2020   Hypercholesterolemia 11/09/2020   H/O adenomatous polyp of colon 05/05/2020   Iron deficiency anemia 05/05/2020   Chronic kidney disease, stage IV (severe) ([place]) 12/14/2019   Acute gouty arthritis 10/25/2019   Leukocytosis 04/02/2019   Cellulitis of right hand 04/01/2019   Osteoarthritis of ankle and foot 02/08/2019   Heart failure with improved ejection fraction (HFimpEF) ([place]) 07/05/2015   Acute congestive heart failure ([place]) 07/04/2015   Acute on chronic diastolic congestive heart failure ([place]) 07/04/2015   Mixed hyperlipidemia 07/04/2015   Anxiety state 07/04/2015   GERD (gastroesophageal reflux disease) 07/04/2015   Heart block 07/04/2015   Gastroesophageal reflux disease 37/62/8315   Diastolic CHF, chronic (HCC) 03/11/2015   Chronic diastolic heart failure ([place]) 03/11/2015   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   Knee stiffness 01/13/2014   Gout 12/11/2013   Expected blood loss anemia 11/17/2013   S/P right TKA 11/15/2013   Anemia, normocytic normochromic 01/12/2013   Acute kidney injury superimposed on chronic kidney disease ([place]) 12/10/2012   Diabetes mellitus type 2 in obese ([place]) 12/10/2012   Coronary artery disease involving native coronary artery of native heart without angina pectoris 11/24/2012   Overweight(278.02) 11/17/2012   Chronic kidney disease, stage 3, cardiorenal syndrome 08/23/2011   Chronic kidney disease, stage 3 unspecified ([place]) 08/23/2011   Essential hypertension     Orientation RESPIRATION BLADDER Height & Weight     Self, Time, Situation, Place  Normal Continent Weight:  (bedscale improperly calibrated) Height:  BEHAVIORAL SYMPTOMS/MOOD NEUROLOGICAL BOWEL NUTRITION STATUS      Continent Diet (see dc summary)  AMBULATORY STATUS COMMUNICATION OF NEEDS Skin   Extensive Assist Verbally Normal                        Personal Care Assistance Level of Assistance    Bathing Assistance: Maximum assistance Feeding assistance: Independent Dressing Assistance: Maximum assistance     Functional Limitations Info    Sight Info: Adequate Hearing Info: Adequate Speech Info: Adequate    SPECIAL CARE FACTORS FREQUENCY  PT (By licensed PT), OT (By licensed OT)     PT Frequency: 5x week OT Frequency: 3x week            Contractures Contractures Info: Not present    Additional Factors Info  Code Status, Allergies, Psychotropic Code Status Info: Full Allergies Info: Metformin, Niacin, Niacin related           Current Medications (12/13/2022):  This is the current hospital active medication list Current Facility-Administered Medications  Medication Dose Route Frequency Provider Last Rate Last Admin   acetaminophen (TYLENOL) tablet 650 mg  650 mg Oral Q6H PRN Kathie Dike, MD       Or   acetaminophen (TYLENOL) suppository 650 mg  650 mg Rectal Q6H PRN Kathie Dike, MD       allopurinol (ZYLOPRIM) tablet 100 mg  100 mg Oral Daily Memon, Jolaine Artist, MD       alteplase (CATHFLO ACTIVASE) injection 2 mg  2 mg Intracatheter Once PRN Corliss Parish, MD       cefoTEtan (CEFOTAN) 2 g in sodium chloride 0.9 % 100 mL IVPB  2 g Intravenous On Call to OR Virl Cagey, MD       Chlorhexidine Gluconate Cloth 2 % PADS 6 each  6 each Topical Daily Memon, Jolaine Artist, MD       Chlorhexidine Gluconate Cloth 2 % PADS 6 each  6 each Topical Q0600 Corliss Parish, MD       cycloSPORINE (RESTASIS) 0.05 % ophthalmic emulsion 1 drop  1 drop Both Eyes BID Kathie Dike, MD   1 drop at 12/12/22 2344   docusate sodium (COLACE) capsule 100 mg  100 mg Oral BID Kathie Dike, MD   100 mg at 12/12/22 2339   ferric gluconate (FERRLECIT) 250 mg in sodium chloride 0.9 % 250 mL IVPB  250 mg Intravenous Daily Corliss Parish, MD 135 mL/hr at 12/13/22 0939 250 mg at 12/13/22 0939   gabapentin (NEURONTIN)  capsule 300 mg  300 mg Oral QID Kathie Dike, MD   300 mg at 12/12/22 2340   heparin injection 1,000 Units  1,000 Units Intracatheter PRN Corliss Parish, MD   3,800 Units at 12/13/22 1513   hydrALAZINE (APRESOLINE) tablet 25 mg  25 mg Oral BID Corliss Parish, MD   25 mg at 12/12/22 2340   insulin aspart (novoLOG) injection 0-5 Units  0-5 Units Subcutaneous QHS Memon, Jolaine Artist, MD       insulin aspart (novoLOG) injection 0-9 Units  0-9 Units Subcutaneous TID WC Kathie Dike, MD   1 Units at 12/12/22 1659   isosorbide dinitrate (ISORDIL) tablet 10 mg  10 mg Oral BID Mallipeddi, Vishnu P, MD       leptospermum manuka honey (MEDIHONEY) paste 1 Application  1 Application Topical Daily Virl Cagey, MD       melatonin tablet 4.5 mg  4.5 mg Oral QHS Kathie Dike, MD   4.5  mg at 12/12/22 2341   nitroGLYCERIN (NITROSTAT) SL tablet 0.4 mg  0.4 mg Sublingual Q5 min PRN Kathie Dike, MD       polyethylene glycol (MIRALAX / GLYCOLAX) packet 17 g  17 g Oral Daily Memon, Jolaine Artist, MD       polyvinyl alcohol (LIQUIFILM TEARS) 1.4 % ophthalmic solution 1 drop  1 drop Both Eyes Daily PRN Kathie Dike, MD       pravastatin (PRAVACHOL) tablet 40 mg  40 mg Oral QPM Kathie Dike, MD   40 mg at 12/12/22 1659   traMADol (ULTRAM) tablet 50 mg  50 mg Oral QID PRN Kathie Dike, MD         Discharge Medications: Please see discharge summary for a list of discharge medications.  Relevant Imaging Results:  Relevant Lab Results:   Additional Information SSN: [id number], outpatient HD MWF [facility] [address] [place], [place]

## 2022-12-13 NOTE — Progress Notes (Signed)
PROGRESS NOTE    ALVEY BROCKEL  QBH:[id number] DOB: 1946-01-19 DOA: 12/12/2022 PCP: Redmond School, MD    Brief Narrative:  DESMOND SZABO is a 76 y.o. male with medical history significant of end-stage renal disease on hemodialysis, HFrEF with EF 35 to 40% in 07/2022, diabetes, hypertension, who has had issues with recurrent nausea.  He has had this over the last several months and it was initially felt to be related to cholecystitis.  Due to his high risk for surgery, he had cholecystostomy tube placed in the last few months.  Recently, his tube was dislodged and came out.  Surgery notes indicate that he had recanalized his bile duct and therefore tube was not replaced.  He had presented today for elective cholecystectomy.  Preop evaluation including EKG had noted some EKG changes including diffuse T wave inversions and prolonged QT.  Preop labs show that he has hypokalemia.  He reports continued issues with nausea and poor p.o. intake.  Does not really have vomiting or diarrhea.  Denies any recent chest pain or shortness of breath.  He has normal dialysis schedule is Monday, Wednesday, Friday and his last dialysis session was yesterday as scheduled.  He had a shortened session for 2 hours which she reports was ended because he had to have a bowel movement.  He was recently seen in the emergency room at the beginning of this month where he was noted to have trouble with walking and overall weakness.  He was offered skilled nursing facility placement for rehab, but he ultimately elected to go home.  Due to EKG changes and underlying cardiac issues, his surgery today was canceled.  I was contacted by Dr. Constance Haw regarding medical admission to optimize his cardiac status.  Cardiology consultation requested for preop evaluation and to optimize his cardiac status.  Nephrology also consulted for dialysis needs.    Assessment & Plan:   Principal Problem:   Chronic cholecystitis Active Problems:   ESRD  (end stage renal disease) (HCC)   Coronary artery disease involving native coronary artery of native heart without angina pectoris   Hypokalemia   Diabetes mellitus type 2 in obese (HCC)   Anemia, normocytic normochromic   Essential hypertension   Heart failure with improved ejection fraction (HFimpEF) (HCC)   HFrEF (heart failure with reduced ejection fraction) (HCC)   Nausea and vomiting   Pressure injury of skin of sacral region   Prolonged QT interval   Generalized weakness   Abnormal EKG   Nausea and vomiting Chronic cholecystitis -Initial plan was for cholecystectomy, although surgical plans have been canceled due to need for cardiac workup -Continue antiemetics -Patient can follow-up with general surgery once his cardiac status has been addressed   Prolonged QTc, T wave inversions -Suspect this is related to hypokalemia -Appreciate cardiology input -Need to repeat EKG once potassium is better corrected -If T wave inversions on EKG persist despite electrolyte correction, may need transfer to [facility] for cardiac catheterization, since he does have a history of mildly abnormal stress test earlier this year -Avoid QT prolonging agents   Hypokalemia -Likely related to decreased p.o. intake -Replace potassium, check magnesium   Cardiomyopathy/chronic systolic congestive heart failure with improved EF -Last echo from 07/2022 shows EF 35 to 40% -Repeat echo done on this admission shows EF of 50 to 55% -On GDMT, although blood pressure issues have limited treatment -Currently volume status appears compensated -Cardiology consulted  End-stage renal disease -Appreciate nephrology assistance -Regular schedule is Monday,  Wednesday, Friday -He is to be dialyzed today   Anemia of end-stage renal disease -No reported bleeding -Hemoglobin is trending down to 6.5 -He is being transfused 1 unit PRBC -Also receiving ESA   Unstageable sacral pressure injury -Present on  admission -Does not appear to have surrounding cellulitis -Continue wound care, WOC consult   Generalized weakness -Seen by PT with recommendations for skilled nursing facility   Diabetes type 2 -Holding oral agents -Continue on sliding scale insulin -Blood sugars currently stable   DVT prophylaxis: SCD's Start: 12/12/22 1632 Place and maintain sequential compression device Start: 12/12/22 0932  Code Status: Full code Family Communication: Left voicemail for patient's brother 12/22 Disposition Plan: Status is: Inpatient Remains inpatient appropriate because: Further workup of EKG changes     Consultants:  Nephrology Cardiology General surgery  Procedures:    Antimicrobials:      Subjective: Denies any chest pain or shortness of breath.  Currently on dialysis.  Objective: Vitals:   12/13/22 1430 12/13/22 1500 12/13/22 1515 12/13/22 1530  BP: (!) 117/49 (!) 112/51 (!) 114/46 (!) 120/51  Pulse:      Resp: 17 16 18    Temp:    97.9 F (36.6 C)  TempSrc:    Oral  SpO2:    100%    Intake/Output Summary (Last 24 hours) at 12/13/2022 1753 Last data filed at 12/13/2022 1700 Gross per 24 hour  Intake 403.77 ml  Output 0 ml  Net 403.77 ml   Filed Weights    Examination:  General exam: Appears calm and comfortable  Respiratory system: Clear to auscultation. Respiratory effort normal. Cardiovascular system: S1 & S2 heard, RRR. No JVD, murmurs, rubs, gallops or clicks. No pedal edema. Gastrointestinal system: Abdomen is nondistended, soft and nontender. No organomegaly or masses felt. Normal bowel sounds heard. Central nervous system: Alert and oriented. No focal neurological deficits. Extremities: Symmetric 5 x 5 power. Skin: No rashes, lesions or ulcers Psychiatry: Judgement and insight appear normal. Mood & affect appropriate.     Data Reviewed: I have personally reviewed following labs and imaging studies  CBC: Recent Labs  Lab 12/12/22 0637  12/12/22 0643 12/13/22 0431  WBC  --  10.4 7.6  HGB 8.6* 8.5* 6.5*  HCT 27.8* 27.9* 20.8*  MCV  --  106.1* 105.1*  PLT  --  231 591   Basic Metabolic Panel: Recent Labs  Lab 12/12/22 0637 12/12/22 0643 12/13/22 0431 12/13/22 0745  NA 138  --  140  --   K 2.8*  --  3.2*  --   CL 96*  --  101  --   CO2 29  --  30  --   GLUCOSE 119*  --  83  --   BUN 13  --  16  --   CREATININE 3.09*  --  3.61*  --   CALCIUM 9.3  --  9.0  --   MG  --  1.8  --  2.2  PHOS  --   --  4.0  --    GFR: Estimated Creatinine Clearance: 19.9 mL/min (A) (by C-G formula based on SCr of 3.61 mg/dL (H)). Liver Function Tests: Recent Labs  Lab 12/13/22 0431  ALBUMIN 2.2*   No results for input(s): "LIPASE", "AMYLASE" in the last 168 hours. No results for input(s): "AMMONIA" in the last 168 hours. Coagulation Profile: No results for input(s): "INR", "PROTIME" in the last 168 hours. Cardiac Enzymes: No results for input(s): "CKTOTAL", "CKMB", "CKMBINDEX", "TROPONINI" in the  last 168 hours. BNP (last 3 results) No results for input(s): "PROBNP" in the last 8760 hours. HbA1C: No results for input(s): "HGBA1C" in the last 72 hours. CBG: Recent Labs  Lab 12/12/22 1411 12/12/22 1634 12/12/22 2019 12/13/22 0805 12/13/22 1650  GLUCAP 92 127* 173* 88 85   Lipid Profile: No results for input(s): "CHOL", "HDL", "LDLCALC", "TRIG", "CHOLHDL", "LDLDIRECT" in the last 72 hours. Thyroid Function Tests: No results for input(s): "TSH", "T4TOTAL", "FREET4", "T3FREE", "THYROIDAB" in the last 72 hours. Anemia Panel: No results for input(s): "VITAMINB12", "FOLATE", "FERRITIN", "TIBC", "IRON", "RETICCTPCT" in the last 72 hours. Sepsis Labs: No results for input(s): "PROCALCITON", "LATICACIDVEN" in the last 168 hours.  Recent Results (from the past 240 hour(s))  Surgical pcr screen     Status: None   Collection Time: 12/10/22 12:17 PM   Specimen: Nasal Mucosa; Nasal Swab  Result Value Ref Range Status    MRSA, PCR NEGATIVE NEGATIVE Final   Staphylococcus aureus NEGATIVE NEGATIVE Final    Comment: (NOTE) The Xpert SA Assay (FDA approved for NASAL specimens in patients 58 years of age and older), is one component of a comprehensive surveillance program. It is not intended to diagnose infection nor to guide or monitor treatment. Performed at [facility], [address] [place], [place] [address]          Radiology Studies: ECHOCARDIOGRAM LIMITED  Result Date: 12/12/2022    ECHOCARDIOGRAM LIMITED REPORT   Patient Name:   ADIEL ERNEY Date of Exam: 12/12/2022 Medical Rec #:  [id number]       Height:       74.0 in Accession #:    [id number]      Weight:       192.0 lb Date of Birth:  [birth year]       BSA:          2.136 m Patient Age:    36 years        BP:           135/71 mmHg Patient Gender: M               HR:           80 bpm. Exam Location:  [facility] Procedure: Limited Echo, Cardiac Doppler and Limited Color Doppler Indications:    Abnormal EKG R94.31  History:        Patient has prior history of Echocardiogram examinations, most                 recent 07/29/2022. Risk Factors:Hypertension, Diabetes and                 Dyslipidemia.  Sonographer:    Alvino Chapel RCS Referring Phys: [place]  1. Limited Echo to evaluate LVEF  2. Left ventricular ejection fraction, by estimation, is 50 to 55%. The left ventricle has low normal function. The left ventricle has no regional wall motion abnormalities. Left ventricular diastolic function could not be evaluated.  3. The mitral valve is grossly normal. Mild mitral valve regurgitation. No evidence of mitral stenosis.  4. The inferior vena cava is normal in size with greater than 50% respiratory variability, suggesting right atrial pressure of 3 mmHg. Comparison(s): Changes from prior study are noted. LVEF improved from 35-40% in 07/2022. FINDINGS  Left Ventricle: Left ventricular ejection fraction, by estimation, is 50  to 55%. The left ventricle has low normal function. The left ventricle has no regional wall motion abnormalities. The left  ventricular internal cavity size was normal in size. There is no left ventricular hypertrophy. Left ventricular diastolic function could not be evaluated. Left ventricular diastolic function could not be evaluated due to abnormal septal motion. Left Atrium: Left atrial size was normal in size. Right Atrium: Right atrial size was normal in size. Pericardium: There is no evidence of pericardial effusion. Mitral Valve: The mitral valve is grossly normal. Mild mitral valve regurgitation. No evidence of mitral valve stenosis. Tricuspid Valve: The tricuspid valve is not assessed. Tricuspid valve regurgitation is not demonstrated. No evidence of tricuspid stenosis. Aortic Valve: The aortic valve was not well visualized. There is mild calcification of the aortic valve. Pulmonic Valve: The pulmonic valve was not assessed. Pulmonic valve regurgitation is not visualized. No evidence of pulmonic stenosis. Aorta: The aortic root is normal in size and structure. Venous: The inferior vena cava is normal in size with greater than 50% respiratory variability, suggesting right atrial pressure of 3 mmHg. IAS/Shunts: No atrial level shunt detected by color flow Doppler. LEFT VENTRICLE PLAX 2D LVIDd:         5.50 cm LVIDs:         4.30 cm LV PW:         1.30 cm LV IVS:        1.20 cm LVOT diam:     1.90 cm LVOT Area:     2.84 cm  LV Volumes (MOD) LV vol d, MOD A2C: 110.0 ml LV vol d, MOD A4C: 132.0 ml LV vol s, MOD A2C: 60.8 ml LV vol s, MOD A4C: 62.2 ml LV SV MOD A2C:     49.2 ml LV SV MOD A4C:     132.0 ml LV SV MOD BP:      63.8 ml LEFT ATRIUM         Index LA diam:    3.80 cm 1.78 cm/m   AORTA Ao Root diam: 3.60 cm MITRAL VALVE MV Area (PHT): 3.91 cm     SHUNTS MV Decel Time: 194 msec     Systemic Diam: 1.90 cm MV E velocity: 107.00 cm/s Vishnu Priya Mallipeddi Electronically signed by Lorelee Cover Mallipeddi  Signature Date/Time: 12/12/2022/3:21:48 PM    Final         Scheduled Meds:  allopurinol  100 mg Oral Daily   Chlorhexidine Gluconate Cloth  6 each Topical Daily   Chlorhexidine Gluconate Cloth  6 each Topical Q0600   cycloSPORINE  1 drop Both Eyes BID   docusate sodium  100 mg Oral BID   gabapentin  300 mg Oral QID   hydrALAZINE  25 mg Oral BID   insulin aspart  0-5 Units Subcutaneous QHS   insulin aspart  0-9 Units Subcutaneous TID WC   isosorbide dinitrate  10 mg Oral BID   leptospermum manuka honey  1 Application Topical Daily   melatonin  4.5 mg Oral QHS   polyethylene glycol  17 g Oral Daily   pravastatin  40 mg Oral QPM   Continuous Infusions:  cefoTEtan (CEFOTAN) IV     ferric gluconate (FERRLECIT) IVPB 250 mg (12/13/22 0939)     LOS: 0 days    Time spent: 65mns    JKathie Dike MD [facility]   If 7PM-7AM, please contact night-coverage [email or web address]  12/13/2022, 5:53 PM

## 2022-12-13 NOTE — Progress Notes (Signed)
Awaiting type and screen results. [employer] infusing at this time.

## 2022-12-13 NOTE — Progress Notes (Addendum)
Reason for Consult: To manage dialysis and dialysis related needs Referring Physician: [name] is an 76 y.o. male with CAD, DM, HTN and chronic cholecystitis.  He also has ESRD.  He presented yesterday as an elective cholecystectomy was planned.  But then pt had low K and EKG changes so was admitted for obs.  Cardiology has seen-  recommended to normalize K and recheck EKG-  echo was checked and was found to be improved.  He has seen wound care for a sacral wound.  This AM his hgb was 6.5 -  last was 7.6 at [facility] and yesterday was 8.5.  We are asked to see to provide HD-  it was last done on Wed but truncated    His OP orders are at [facility]  4 hours-  MWF TDC -  400 BFR 2K/2.5 calc bath/ EDW 84.5  heparin 4000 bolus Mircera 225 q 2 weeks Last hgb 7.6, calc 9.2, phos 3.9, PTH 70  Past Medical History:  Diagnosis Date   Anginal pain (HCC)    Anxiety    Benign prostatic hypertrophy    Nocturia   CAD (coronary artery disease)    a.  NSTEMI 10/2012 s/p DES to LAD & DES to 1st diagonal with residual diffuse nonobstructive dz in LCx/RCA; b. 04/2022 MV: EF 55%, prior inferior MI. Small apical infarct w/ mild peri-infarct ischemia.   Cardiomyopathy ([place])    a. 03/2009 Echo: EF 50-55%; b. 01/2022 Echo: EF 60-65%; c. 07/2022 Echo: EF 35-40%, glob HK, worse @ apex. In Afib during study.   Degenerative joint disease    Depression    hx of   Diabetes mellitus, type II ([place]) 12/10/2012   Erectile dysfunction    ESRD (end stage renal disease) ([place])    a. HD initiated 07/2022.   Flu 2013   hx of   GERD (gastroesophageal reflux disease)    Gout    Hemorrhoids    High cholesterol    Hypertension    Exercise induced   Myocardial infarction [facility])    Neuropathy    bilateral legs   NSVT (nonsustained ventricular tachycardia) (HCC)    Exercise induced   Obesity    PAF (paroxysmal atrial fibrillation) ([place])    a. CHA2DS2VASc = 6.  No OAC   Spinal cord stimulator    Tubular  adenoma 2014   Vertigo    everyday    Past Surgical History:  Procedure Laterality Date   BACK SURGERY  2007   x2   BIOPSY  02/05/2021   Procedure: BIOPSY;  Surgeon: Daneil Dolin, MD;  [facility];  Service: Endoscopy;;   CARDIAC CATHETERIZATION     with stent placement   COLONOSCOPY WITH ESOPHAGOGASTRODUODENOSCOPY (EGD) N/A 11/23/2013   Dr. Oneida Alar- TCS= left sided colitis and mild proctitis likely due to ischemic colitis, moderate internal hemorrhoids, small nodule in the rectum, bx=tubular adenoma, EGD=-mild non-erosive gastritis   COLONOSCOPY WITH PROPOFOL N/A 07/24/2020   hemorrhoids,  rectosigmoid lipoma, small adenoma removed.   CORONARY ANGIOPLASTY     ESOPHAGOGASTRODUODENOSCOPY (EGD) WITH PROPOFOL N/A 02/05/2021   Normal esophagus, erythematous mucosa in entire stomach, no obvious ulcer, s/p biopsy. Normal duodenum. Negative H.pylori.    HERNIA REPAIR     INSERTION OF DIALYSIS CATHETER N/A 08/06/2022   Procedure: INSERTION OF DIALYSIS CATHETER;  Surgeon: Rosetta Posner, MD;  Location: [facility];  Service: Vascular;  Laterality: N/A;   IR EXCHANGE BILIARY DRAIN  10/17/2022  IR PERC CHOLECYSTOSTOMY  08/20/2022   JOINT REPLACEMENT     LEFT HEART CATHETERIZATION WITH CORONARY ANGIOGRAM N/A 11/16/2012   Procedure: LEFT HEART CATHETERIZATION WITH CORONARY ANGIOGRAM;  Surgeon: Sherren Mocha, MD;  Location: [facility] CATH LAB;  Service: Cardiovascular;  Laterality: N/A;   PERCUTANEOUS CORONARY STENT INTERVENTION (PCI-S)  11/16/2012   Procedure: PERCUTANEOUS CORONARY STENT INTERVENTION (PCI-S);  Surgeon: Sherren Mocha, MD;  Location: [facility] CATH LAB;  Service: Cardiovascular;;   POLYPECTOMY  07/24/2020   Procedure: POLYPECTOMY;  Surgeon: Daneil Dolin, MD;  [facility];  Service: Endoscopy;;   SPINAL CORD STIMULATOR INSERTION N/A 06/10/2022   Procedure: Spinal cord stimulator placement;  Surgeon: Newman Pies, MD;  Location: [place];  Service: Neurosurgery;   Laterality: N/A;  RM 19/ to follow   THUMB AMPUTATION Left    50 years ago   TOTAL KNEE ARTHROPLASTY Right 11/15/2013   Procedure: RIGHT TOTAL KNEE ARTHROPLASTY;  Surgeon: Mauri Pole, MD;  Location: [facility];  Service: Orthopedics;  Laterality: Right;   TOTAL KNEE ARTHROPLASTY Left 03/07/2015   Procedure: LEFT TOTAL KNEE ARTHROPLASTY;  Surgeon: Paralee Cancel, MD;  Location: [facility];  Service: Orthopedics;  Laterality: Left;    Family History  Problem Relation Age of Onset   Hypertension Other    Cancer Other    Heart disease Other    Diabetes Other    Hypertension Mother    Cancer Father        brain   CAD Brother    Colon cancer Neg Hx     Social History:  reports that he quit smoking about 29 years ago. His smoking use included cigarettes. He started smoking about 57 years ago. He has a 30.00 pack-year smoking history. He has never used smokeless tobacco. He reports that he does not drink alcohol and does not use drugs.  Allergies:  Allergies  Allergen Reactions   Metformin Nausea And Vomiting   Niacin    Niacin And Related Other (See Comments)    Reaction: flushing and burning side effects even with extended release    Medications: I have reviewed the patient's current medications.  Results for orders placed or performed during the hospital encounter of 12/12/22 (from the past 48 hour(s))  Basic metabolic panel     Status: Abnormal   Collection Time: 12/12/22  6:37 AM  Result Value Ref Range   Sodium 138 135 - 145 mmol/L   Potassium 2.8 (L) 3.5 - 5.1 mmol/L   Chloride 96 (L) 98 - 111 mmol/L   CO2 29 22 - 32 mmol/L   Glucose, Bld 119 (H) 70 - 99 mg/dL    Comment: Glucose reference range applies only to samples taken after fasting for at least 8 hours.   BUN 13 8 - 23 mg/dL   Creatinine, Ser 3.09 (H) 0.61 - 1.24 mg/dL   Calcium 9.3 8.9 - 10.3 mg/dL   GFR, Estimated 20 (L) >60 mL/min    Comment: (NOTE) Calculated using the CKD-EPI Creatinine Equation (2021)     Anion gap 13 5 - 15    Comment: Performed at [facility], [address] [place], [place] [address]  Hemoglobin and hematocrit, blood     Status: Abnormal   Collection Time: 12/12/22  6:37 AM  Result Value Ref Range   Hemoglobin 8.6 (L) 13.0 - 17.0 g/dL   HCT 27.8 (L) 39.0 - 52.0 %    Comment: Performed at [facility], [address] [place],  [address]  Magnesium     Status: None   Collection Time: 12/12/22  6:43 AM  Result Value Ref Range   Magnesium 1.8 1.7 - 2.4 mg/dL    Comment: Performed at [facility], [address], [place], [place] [address]  CBC     Status: Abnormal   Collection Time: 12/12/22  6:43 AM  Result Value Ref Range   WBC 10.4 4.0 - 10.5 K/uL   RBC 2.63 (L) 4.22 - 5.81 MIL/uL   Hemoglobin 8.5 (L) 13.0 - 17.0 g/dL   HCT 27.9 (L) 39.0 - 52.0 %   MCV 106.1 (H) 80.0 - 100.0 fL   MCH 32.3 26.0 - 34.0 pg   MCHC 30.5 30.0 - 36.0 g/dL   RDW 16.8 (H) 11.5 - 15.5 %   Platelets 231 150 - 400 K/uL   nRBC 0.0 0.0 - 0.2 %    Comment: Performed at [facility], [address] [place], [place] [address]  Glucose, capillary     Status: None   Collection Time: 12/12/22  2:11 PM  Result Value Ref Range   Glucose-Capillary 92 70 - 99 mg/dL    Comment: Glucose reference range applies only to samples taken after fasting for at least 8 hours.  Glucose, capillary     Status: Abnormal   Collection Time: 12/12/22  4:34 PM  Result Value Ref Range   Glucose-Capillary 127 (H) 70 - 99 mg/dL    Comment: Glucose reference range applies only to samples taken after fasting for at least 8 hours.  Glucose, capillary     Status: Abnormal   Collection Time: 12/12/22  8:19 PM  Result Value Ref Range   Glucose-Capillary 173 (H) 70 - 99 mg/dL    Comment: Glucose reference range applies only to samples taken after fasting for at least 8 hours.  Renal function panel     Status: Abnormal   Collection Time: 12/13/22  4:31 AM  Result Value Ref Range   Sodium 140 135 - 145 mmol/L    Potassium 3.2 (L) 3.5 - 5.1 mmol/L   Chloride 101 98 - 111 mmol/L   CO2 30 22 - 32 mmol/L   Glucose, Bld 83 70 - 99 mg/dL    Comment: Glucose reference range applies only to samples taken after fasting for at least 8 hours.   BUN 16 8 - 23 mg/dL   Creatinine, Ser 3.61 (H) 0.61 - 1.24 mg/dL   Calcium 9.0 8.9 - 10.3 mg/dL   Phosphorus 4.0 2.5 - 4.6 mg/dL   Albumin 2.2 (L) 3.5 - 5.0 g/dL   GFR, Estimated 17 (L) >60 mL/min    Comment: (NOTE) Calculated using the CKD-EPI Creatinine Equation (2021)    Anion gap 9 5 - 15    Comment: Performed at [facility], [address] [place], [place] [address]  CBC     Status: Abnormal   Collection Time: 12/13/22  4:31 AM  Result Value Ref Range   WBC 7.6 4.0 - 10.5 K/uL   RBC 1.98 (L) 4.22 - 5.81 MIL/uL   Hemoglobin 6.5 (LL) 13.0 - 17.0 g/dL    Comment: This critical result has verified and been called to [facility]  by Eula Listen on 12 22 2023 at [phone], and has been read back.    HCT 20.8 (L) 39.0 - 52.0 %   MCV 105.1 (H) 80.0 - 100.0 fL   MCH 32.8 26.0 - 34.0 pg   MCHC 31.3 30.0 - 36.0 g/dL   RDW 16.4 (  H) 11.5 - 15.5 %   Platelets 192 150 - 400 K/uL   nRBC 0.0 0.0 - 0.2 %    Comment: Performed at [name], [address], [place], [place] [address]  Magnesium     Status: None   Collection Time: 12/13/22  7:45 AM  Result Value Ref Range   Magnesium 2.2 1.7 - 2.4 mg/dL    Comment: Performed at [facility], [name], [place], [place] [address]  Glucose, capillary     Status: None   Collection Time: 12/13/22  8:05 AM  Result Value Ref Range   Glucose-Capillary 88 70 - 99 mg/dL    Comment: Glucose reference range applies only to samples taken after fasting for at least 8 hours.    ECHOCARDIOGRAM LIMITED  Result Date: 12/12/2022    ECHOCARDIOGRAM LIMITED REPORT   Patient Name:   [name] Date of Exam: 12/12/2022 Medical Rec #:  [id number]       Height:       74.0 in Accession #:    [id number]      Weight:        192.0 lb Date of Birth:  06-06-1946       BSA:          2.136 m Patient Age:    21 years        BP:           135/71 mmHg Patient Gender: M               HR:           80 bpm. Exam Location:  [facility] Procedure: Limited Echo, Cardiac Doppler and Limited Color Doppler Indications:    Abnormal EKG R94.31  History:        Patient has prior history of Echocardiogram examinations, most                 recent 07/29/2022. Risk Factors:Hypertension, Diabetes and                 Dyslipidemia.  Sonographer:    Alvino Chapel RCS Referring Phys: [place]  1. Limited Echo to evaluate LVEF  2. Left ventricular ejection fraction, by estimation, is 50 to 55%. The left ventricle has low normal function. The left ventricle has no regional wall motion abnormalities. Left ventricular diastolic function could not be evaluated.  3. The mitral valve is grossly normal. Mild mitral valve regurgitation. No evidence of mitral stenosis.  4. The inferior vena cava is normal in size with greater than 50% respiratory variability, suggesting right atrial pressure of 3 mmHg. Comparison(s): Changes from prior study are noted. LVEF improved from 35-40% in 07/2022. FINDINGS  Left Ventricle: Left ventricular ejection fraction, by estimation, is 50 to 55%. The left ventricle has low normal function. The left ventricle has no regional wall motion abnormalities. The left ventricular internal cavity size was normal in size. There is no left ventricular hypertrophy. Left ventricular diastolic function could not be evaluated. Left ventricular diastolic function could not be evaluated due to abnormal septal motion. Left Atrium: Left atrial size was normal in size. Right Atrium: Right atrial size was normal in size. Pericardium: There is no evidence of pericardial effusion. Mitral Valve: The mitral valve is grossly normal. Mild mitral valve regurgitation. No evidence of mitral valve stenosis. Tricuspid Valve: The tricuspid  valve is not assessed. Tricuspid valve regurgitation is not demonstrated. No evidence of tricuspid stenosis. Aortic Valve: The  aortic valve was not well visualized. There is mild calcification of the aortic valve. Pulmonic Valve: The pulmonic valve was not assessed. Pulmonic valve regurgitation is not visualized. No evidence of pulmonic stenosis. Aorta: The aortic root is normal in size and structure. Venous: The inferior vena cava is normal in size with greater than 50% respiratory variability, suggesting right atrial pressure of 3 mmHg. IAS/Shunts: No atrial level shunt detected by color flow Doppler. LEFT VENTRICLE PLAX 2D LVIDd:         5.50 cm LVIDs:         4.30 cm LV PW:         1.30 cm LV IVS:        1.20 cm LVOT diam:     1.90 cm LVOT Area:     2.84 cm  LV Volumes (MOD) LV vol d, MOD A2C: 110.0 ml LV vol d, MOD A4C: 132.0 ml LV vol s, MOD A2C: 60.8 ml LV vol s, MOD A4C: 62.2 ml LV SV MOD A2C:     49.2 ml LV SV MOD A4C:     132.0 ml LV SV MOD BP:      63.8 ml LEFT ATRIUM         Index LA diam:    3.80 cm 1.78 cm/m   AORTA Ao Root diam: 3.60 cm MITRAL VALVE MV Area (PHT): 3.91 cm     SHUNTS MV Decel Time: 194 msec     Systemic Diam: 1.90 cm MV E velocity: 107.00 cm/s Vishnu Priya Mallipeddi Electronically signed by Lorelee Cover Mallipeddi Signature Date/Time: 12/12/2022/3:21:48 PM    Final     ROS: dec appetite-  occasional abd pain Blood pressure (!) 100/42, pulse 67, temperature 97.7 F (36.5 C), temperature source Oral, resp. rate 18, SpO2 99 %. General appearance: alert, cooperative, no distress, and up walking with PT Resp: clear to auscultation bilaterally Cardio: regular rate and rhythm, S1, S2 normal, no murmur, click, rub or gallop GI: soft, non-tender; bowel sounds normal; no masses,  no organomegaly Extremities: extremities normal, atraumatic, no cyanosis or edema Right TDC-  non tender  Assessment/Plan: 76 year old BM with multiple medical issues including ESRD-  admit for EKG  changes found pre elective chole-  found to have anemia as well 1 EKG changes-  cards concerned about K being low-  suggest to recheck EKG when K is up-- pt does not appear to be having any sxms 2 ESRD: normally MWF via [facility]-  had on Wed.  Per [facility] holiday schedule would be done Friday and Sunday-  so will plan for HD today -  can give blood with HD 3 Hypertension/volume: does not seem overloaded at all-  getting ivf at 50 per hour-  not sure I love that so will stop but will not do any UF with HD -  I put parameter on his hydralazine for low BP 4. Anemia of ESRD: has this and OP hgb was in the 7's-  now 6-  can give blood with HD-  last iron stores low-  will give that as well and ESA 5. Metabolic Bone Disease: on no meds as op -  phos OK  6. Cholecystitis-  chronic issue-  had been determined to not be candidate for surgery in past but now considering   Renal will not physically see over the weekend-  if is here will do HD on Sunday.  Call us with any questions    [name] 12/13/2022, 8:24  AM

## 2022-12-13 NOTE — Plan of Care (Signed)
  Problem: Acute Rehab PT Goals(only PT should resolve) Goal: Pt Will Go Supine/Side To Sit Outcome: Progressing Flowsheets (Taken 12/13/2022 1209) Pt will go Supine/Side to Sit:  with min guard assist  with minimal assist Goal: Patient Will Transfer Sit To/From Stand Outcome: Progressing Flowsheets (Taken 12/13/2022 1209) Patient will transfer sit to/from stand:  with min guard assist  with minimal assist Goal: Pt Will Transfer Bed To Chair/Chair To Bed Outcome: Progressing Flowsheets (Taken 12/13/2022 1209) Pt will Transfer Bed to Chair/Chair to Bed:  with min assist  min guard assist Goal: Pt Will Ambulate Outcome: Progressing Flowsheets (Taken 12/13/2022 1209) Pt will Ambulate:  50 feet  with min guard assist  with minimal assist  with rolling walker   12:10 PM, 12/13/22 [name], MPT Physical Therapist with [facility] [phone] office [phone] mobile phone

## 2022-12-13 NOTE — Plan of Care (Signed)
  Problem: Acute Rehab OT Goals (only OT should resolve) Goal: Pt. Will Perform Upper Body Bathing Flowsheets (Taken 12/13/2022 0955) Pt Will Perform Upper Body Bathing:  with set-up  sitting Goal: Pt. Will Perform Lower Body Dressing Flowsheets (Taken 12/13/2022 0955) Pt Will Perform Lower Body Dressing: with min assist Goal: Pt. Will Transfer To Toilet Flowsheets (Taken 12/13/2022 [phone]) Pt Will Transfer to Toilet: with min guard assist  Arvil Persons, OTR/L

## 2022-12-13 NOTE — Progress Notes (Signed)
Cardiology Progress Note   Patient Name: [name] Date of Encounter: 12/13/2022  Primary Cardiologist: Carlyle Dolly, MD  Subjective   Feels well this AM. Denies chest pain, dyspnea, or abd pain.  Was hoping to have cholecystectomy today but ongoing abnl ECG w/ worsening anemia (H/H 6.5/20.8).  Inpatient Medications    Scheduled Meds:  sodium chloride   Intravenous Once   allopurinol  100 mg Oral Daily   Chlorhexidine Gluconate Cloth  6 each Topical Daily   cycloSPORINE  1 drop Both Eyes BID   docusate sodium  100 mg Oral BID   gabapentin  300 mg Oral QID   hydrALAZINE  25 mg Oral BID   insulin aspart  0-5 Units Subcutaneous QHS   insulin aspart  0-9 Units Subcutaneous TID WC   isosorbide mononitrate  15 mg Oral Daily   leptospermum manuka honey  1 Application Topical Daily   melatonin  4.5 mg Oral QHS   polyethylene glycol  17 g Oral Daily   pravastatin  40 mg Oral QPM   Continuous Infusions:  cefoTEtan (CEFOTAN) IV     potassium chloride 10 mEq (12/13/22 0846)   PRN Meds: acetaminophen **OR** acetaminophen, nitroGLYCERIN, polyvinyl alcohol, traMADol   Vital Signs    Vitals:   12/12/22 1512 12/12/22 2018 12/12/22 2357 12/13/22 0331  BP: (!) 150/62 (!) 118/57 (!) 121/55 (!) 100/42  Pulse: 77 72 70 67  Resp:  18 17 18   Temp: 97.7 F (36.5 C) 97.6 F (36.4 C) 97.7 F (36.5 C) 97.7 F (36.5 C)  TempSrc: Oral Oral Oral Oral  SpO2: 100% 100% 97% 99%    Intake/Output Summary (Last 24 hours) at 12/13/2022 0847 Last data filed at 12/12/2022 1900 Gross per 24 hour  Intake 278.27 ml  Output --  Net 278.27 ml   There were no vitals filed for this visit.  Physical Exam   GEN: Well nourished, well developed, in no acute distress.  HEENT: Grossly normal.  Neck: Supple, no JVD, carotid bruits, or masses. Cardiac: RRR, no murmurs, rubs, or gallops. No clubbing, cyanosis, 1+ bilat ankle edema.  Radials 2+, DP/PT 1+ and equal bilaterally.  Respiratory:   Respirations regular and unlabored, clear to auscultation bilaterally. GI: Soft, nontender, nondistended, BS + x 4. MS: no deformity or atrophy. Skin: warm and dry, no rash. Neuro:  Strength and sensation are intact. Psych: AAOx3.  Normal affect.  Labs    Chemistry Recent Labs  Lab 12/12/22 0637 12/13/22 0431  NA 138 140  K 2.8* 3.2*  CL 96* 101  CO2 29 30  GLUCOSE 119* 83  BUN 13 16  CREATININE 3.09* 3.61*  CALCIUM 9.3 9.0  ALBUMIN  --  2.2*  GFRNONAA 20* 17*  ANIONGAP 13 9     Hematology Recent Labs  Lab 12/12/22 0637 12/12/22 0643 12/13/22 0431  WBC  --  10.4 7.6  RBC  --  2.63* 1.98*  HGB 8.6* 8.5* 6.5*  HCT 27.8* 27.9* 20.8*  MCV  --  106.1* 105.1*  MCH  --  32.3 32.8  MCHC  --  30.5 31.3  RDW  --  16.8* 16.4*  PLT  --  231 192   BNP    Component Value Date/Time   BNP 132.0 (H) 08/10/2022 0602    ProBNP    Component Value Date/Time   PROBNP 86.0 11/15/2012 0015   Lipids  Lab Results  Component Value Date   CHOL 99 05/10/2022   HDL 29 (  L) 05/10/2022   LDLCALC 46 05/10/2022   TRIG 121 05/10/2022   CHOLHDL 3.4 05/10/2022    HbA1c  Lab Results  Component Value Date   HGBA1C 4.4 (L) 12/10/2022    Radiology    ---------  Telemetry    RSR, 60's, 1st deg avb w/ PR of 344mec, intermittent mobitz I, PACs/PVCs - Personally Reviewed  ECG    RSR, 61, 1st deg AVB, PVC, intermittent Mobitz I, septal infarct, inferior and anterolateral TWI - Personally Reviewed  Cardiac Studies   2D Echocardiogram 8.2023  1. Limited echo for LVEF and regional wall motion.   2. No apical thrombus by Definity contrast. Left ventricular ejection  fraction, by estimation, is 35 to 40%. Left ventricular ejection fraction  by 2D MOD biplane is 36.6 %. The left ventricle has moderately decreased  function. The left ventricle  demonstrates severe global hypokinesis, worse at the apex. Regional wall  motion is difficult to ascertain given frequent PVC's.   3.  The inferior vena cava is dilated in size with <50% respiratory  variability, suggesting right atrial pressure of 15 mmHg.   4. Right ventricular systolic function was not well visualized. The right  ventricular size is not well visualized.   5. Rhythm strip during this exam demonstrates atrial fibrillation.  _____________  2D Echocardiogram 12.21.2023  1. Limited Echo to evaluate LVEF   2. Left ventricular ejection fraction, by estimation, is 50 to 55%. The  left ventricle has low normal function. The left ventricle has no regional  wall motion abnormalities. Left ventricular diastolic function could not  be evaluated.   3. The mitral valve is grossly normal. Mild mitral valve regurgitation.  No evidence of mitral stenosis.   4. The inferior vena cava is normal in size with greater than 50%  respiratory variability, suggesting right atrial pressure of 3 mmHg. _____________   Patient Profile      76y.o. male with a history of coronary artery disease status post prior MI and drug-eluting stent placement to the LAD and first diagonal in November 2013, cardiomyopathy (EF 35 to 40% with global hypokinesis in August 2023), hypertension, hyperlipidemia, ? Of paroxysmal atrial fibrillation, first-degree AV block, Mobitz 1 heart block, type 2 diabetes mellitus, and end-stage renal disease now on hemodialysis, who presented 12/21 for elective cholecystectomy, however case cancelled in setting of newly abnl ECG w/ deep inferior and anterolateral TWI and prolonged QTC (582).  Assessment & Plan    1.  Abnormal ECG/Prolonged QT/1st deg AVB/Intermittent Mobitz I:  pt w/ h/o wide 1st deg AVB - out to 380 msec - along w/ intermittent Mobitz I.  Presented for elective cholecystectomy on 12/21 and was noted to be bradycardic w/ new inferior and anterolateral TWI and QTc prolongation (582).  K 2.8, Mg 1.8.  Both supplemented.  Echo 12/21 w/ improved EF @ 50-55% w/o rwma.  This AM, ECG remains relatively  unchanged.  K only sl better @ 3.4 and more KCl ordered.  Mg 2.2 this AM.  I note that he was using phenergan supp @ home in the setting of abd pain and nausea.  Compazine was ordered here and I have d/c'd - he hasn't received any.  Cont to follow QTc w/ electrolyte supplementation and avoid QT prolonging agents.  With h/o angina and mildly abnl stress test earlier this year, if TWI does not correct, he will require tx to [facility] early next week for diagnostic cath prior to any further consideration for GB surgery.  2.  Preoperative CV exam/CAD:  s/p NSTEMI in 2013 w/ LAD and diag DES. Otw nonobs LCX and RCA dzs @ that time.  MV earlier this year w/ inf infarct and small apical defect w/ mild reversibility.  EF 35-40% in August but now improved by echo 12/21.  He denies chest pain.  HsTrop this AM only minimally elevated @ 30, which is likely nonspecific in setting of ESRD.  As above, w/ newly abnl ECG and deep TWI, he will likely require diagnostic cath prior to any consideration for GB surgery (once anemia stabilized).  Cont statin and nitrate.  He is anemic this AM w/ H/H 6.5/20.8 and is pending PRBCs.  In that setting, I've d/c'd ASA.  3.  ? H/O Afib:  Initially labeled w/ Afib in 07/2022, however subsequent notes indicate that no Afib was actually seen, and as we are seeing on this admission, pt has a low amplitude P wave w/ wide PR interval of 380 msec and intermittent Mobitz I.  ? blocker d/c'd in the setting of bradycardia.  No role for OAC, esp in light of new anemia.  4.  Cardiomyopathy/Chronic HF w/ improved EF:  EF 35-40% in August but now improved by limited echo on 12/21.  Volume mgmt per nephrology (due for HD today).    Cont hydral/nitrate.  No ? blocker in setting of bradycardia/1st deg avb/Mobitz I.  BP runs soft - not likely to tolerate entresto. With ESRD, poor candidate for MRA/sglt2i.  5.  ESRD:  for HD today.  6.  Essential HTN:  stable on hydral/nitrate.  7.  HL:  LDL 46 earlier this  year, cont statin.  8.  Acute on chronic macrocytic anemia:  H/H dropped to 6.5/20.8 this AM.  For PRBCs today.  Pt denies melena/brbpr.  9.  Hypokalemia:  K 3.4 this AM.  Additional supp ordered.  Mg nl this AM @ 2.2.  Signed, Murray Hodgkins, NP  12/13/2022, 8:47 AM    For questions or updates, please contact   Please consult [email or web address] for contact info under Cardiology/STEMI.

## 2022-12-14 DIAGNOSIS — R9431 Abnormal electrocardiogram [ECG] [EKG]: Secondary | ICD-10-CM | POA: Diagnosis not present

## 2022-12-14 DIAGNOSIS — K811 Chronic cholecystitis: Secondary | ICD-10-CM | POA: Diagnosis not present

## 2022-12-14 DIAGNOSIS — E876 Hypokalemia: Secondary | ICD-10-CM | POA: Diagnosis not present

## 2022-12-14 DIAGNOSIS — N186 End stage renal disease: Secondary | ICD-10-CM | POA: Diagnosis not present

## 2022-12-14 LAB — CBC
HCT: 23.6 % — ABNORMAL LOW (ref 39.0–52.0)
Hemoglobin: 7.4 g/dL — ABNORMAL LOW (ref 13.0–17.0)
MCH: 31.6 pg (ref 26.0–34.0)
MCHC: 31.4 g/dL (ref 30.0–36.0)
MCV: 100.9 fL — ABNORMAL HIGH (ref 80.0–100.0)
Platelets: 154 10*3/uL (ref 150–400)
RBC: 2.34 MIL/uL — ABNORMAL LOW (ref 4.22–5.81)
RDW: 20 % — ABNORMAL HIGH (ref 11.5–15.5)
WBC: 8.4 10*3/uL (ref 4.0–10.5)
nRBC: 0 % (ref 0.0–0.2)

## 2022-12-14 LAB — BPAM RBC
Blood Product Expiration Date: 202401142359
Blood Product Expiration Date: 202401142359
Unit Type and Rh: 6200
Unit Type and Rh: 6200

## 2022-12-14 LAB — GLUCOSE, CAPILLARY
Glucose-Capillary: 84 mg/dL (ref 70–99)
Glucose-Capillary: 98 mg/dL (ref 70–99)
Glucose-Capillary: 123 mg/dL — ABNORMAL HIGH (ref 70–99)
Glucose-Capillary: 147 mg/dL — ABNORMAL HIGH (ref 70–99)

## 2022-12-14 LAB — TYPE AND SCREEN
ABO/RH(D): A POS
Antibody Screen: POSITIVE
Donor AG Type: NEGATIVE
Donor AG Type: NEGATIVE
Unit division: 0
Unit division: 0

## 2022-12-14 LAB — BASIC METABOLIC PANEL
Anion gap: 8 (ref 5–15)
BUN: 9 mg/dL (ref 8–23)
CO2: 29 mmol/L (ref 22–32)
Calcium: 8.7 mg/dL — ABNORMAL LOW (ref 8.9–10.3)
Chloride: 102 mmol/L (ref 98–111)
Creatinine, Ser: 2.51 mg/dL — ABNORMAL HIGH (ref 0.61–1.24)
GFR, Estimated: 26 mL/min — ABNORMAL LOW (ref >60)
Glucose, Bld: 82 mg/dL (ref 70–99)
Potassium: 3.8 mmol/L (ref 3.5–5.1)
Sodium: 139 mmol/L (ref 135–145)

## 2022-12-14 LAB — MAGNESIUM: Magnesium: 1.9 mg/dL (ref 1.7–2.4)

## 2022-12-14 LAB — HEPATITIS B SURFACE ANTIBODY, QUANTITATIVE: Hep B S AB Quant (Post): <3.1 m[IU]/mL — ABNORMAL LOW (ref >9.9)

## 2022-12-14 MED ORDER — MAGNESIUM SULFATE 2 GM/50ML IV SOLN
2.0000 g | Freq: Once | INTRAVENOUS | Status: AC
  Administered 2022-12-14: 2 g via INTRAVENOUS
  Filled 2022-12-14: qty 50

## 2022-12-14 MED ORDER — STERILE WATER FOR INJECTION IJ SOLN
INTRAMUSCULAR | Status: AC
  Filled 2022-12-14: qty 10

## 2022-12-14 MED ORDER — ALTEPLASE 2 MG IJ SOLR
INTRAMUSCULAR | Status: AC
  Administered 2022-12-14: 4 mg
  Filled 2022-12-14: qty 4

## 2022-12-14 MED ORDER — CHLORHEXIDINE GLUCONATE CLOTH 2 % EX PADS
6.0000 | MEDICATED_PAD | Freq: Every day | CUTANEOUS | Status: DC
  Administered 2022-12-14 – 2022-12-21 (×8): 6 via TOPICAL

## 2022-12-14 NOTE — Progress Notes (Signed)
Progress Note    [name]   EKC:[id number]  DOB: May 15, 1946  DOA: 12/12/2022     1 PCP: [name] School, MD  Initial CC: nausea   Hospital Course: [name] is a 76 y.o. male with PMH ESRD on MWF HD, HFrEF, DMII, HTN who presented with nausea.  He has a known history of cholecystitis and had undergone cholecystostomy tube placement.  His tube had become dislodged and he underwent evaluation with surgery.  He was noted to have recanalization of his bile duct and therefore the tube was not replaced. He presented to the hospital for elective cholecystectomy and underwent preop evaluation with cardiology.  His workup was notable for diffuse T wave inversions and prolonged QTc.  He was also noted to be significantly hypokalemic.  He was started on electrolyte replacement and admitted for further workup and monitoring.  Nephrology was also consulted to continue his chronic HD during hospitalization.  Interval History:  No events overnight.  Resting in bed comfortably when seen this morning.  Denies any chest pain or shortness of breath.  Assessment and Plan:  Chronic cholecystitis -Initial plan was for cholecystectomy, although surgical plans have been canceled due to need for cardiac workup -Continue antiemetics -Patient can follow-up with general surgery once his cardiac status has been addressed   Abnormal EKG -Prolonged QTc and diffuse T wave inversions - Suspicion was due to underlying hypokalemia, however diffuse TWI persist despite normalization of potassium unless possibly a lag; otherwise patient recommended for cath if TWI persists - daily EKGs for now -Continue to monitor electrolytes - Cardiology has been following; tentative plan will be for transferring patient to have further workup   Hypokalemia -Likely related to decreased p.o. 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 -Last echo  from 07/2022 shows EF 35 to 40% -Repeat echo done on this admission shows EF of 50 to 55%, no RWMA -On GDMT, although blood pressure issues have limited treatment -Currently volume status appears compensated -Cardiology following   ESRD on HD MWF -Appreciate nephrology assistance -Regular schedule is Monday, Wednesday, Friday -continue chronic HD while in hospital   Anemia of end-stage renal disease -No reported bleeding -Also receives ESA - trend Hgb   Unstageable sacral pressure injury -Present on admission -Does not appear to have surrounding cellulitis -Continue wound care, WOC consulted   Generalized weakness -Seen by PT with recommendations for skilled nursing facility   Diabetes type 2 -Holding oral agents -Continue on sliding scale insulin -Blood sugars currently stable   Old records reviewed in assessment of this patient   DVT prophylaxis:  SCD's Start: 12/12/22 1632 Place and maintain sequential compression device Start: 12/12/22 0932   Code Status:   Code Status: Full Code  Mobility Assessment (last 72 hours)     Mobility Assessment     Row Name 12/13/22 1159 12/13/22 0900 12/13/22 0800 12/12/22 1530     Does patient have an order for bedrest or is patient medically unstable -- No - Continue assessment -- No - Continue assessment    What is the highest level of mobility based on the progressive mobility assessment? Level 4 (Walks with assist in room) - Balance while marching in place and cannot step forward and back - Complete Level 4 (Walks with assist in room) - Balance while marching in place and cannot step forward and back - Complete Level 4 (Walks with assist in room) - Balance while  marching in place and cannot step forward and back - Complete Level 3 (Stands with assist) - Balance while standing  and cannot march in place    Is the above level different from baseline mobility prior to current illness? -- Yes - Recommend PT order -- Yes - Recommend PT  order             Barriers to discharge:  Disposition Plan:  Eventually to SNF Status is: Inpt  Objective: Blood pressure (!) 114/54, pulse 66, temperature 98.6 F (37 C), temperature source Oral, resp. rate 16, SpO2 96 %.  Examination:  Physical Exam Constitutional:      Appearance: Normal appearance.  HENT:     Head: Normocephalic and atraumatic.     Mouth/Throat:     Mouth: Mucous membranes are moist.  Eyes:     Extraocular Movements: Extraocular movements intact.  Cardiovascular:     Rate and Rhythm: Normal rate and regular rhythm.  Pulmonary:     Effort: Pulmonary effort is normal.     Breath sounds: Normal breath sounds.  Abdominal:     General: Bowel sounds are normal. There is no distension.     Palpations: Abdomen is soft.     Tenderness: There is no abdominal tenderness.  Musculoskeletal:        General: Normal range of motion.     Cervical back: Normal range of motion and neck supple.  Skin:    General: Skin is warm and dry.  Neurological:     General: No focal deficit present.     Mental Status: He is alert.  Psychiatric:        Mood and Affect: Mood normal.      Consultants:  Cardiology Nephrology  Procedures:    Data Reviewed: Results for orders placed or performed during the hospital encounter of 12/12/22 (from the past 24 hour(s))  Glucose, capillary     Status: None   Collection Time: 12/13/22  4:50 PM  Result Value Ref Range   Glucose-Capillary 85 70 - 99 mg/dL  Basic metabolic panel     Status: Abnormal   Collection Time: 12/13/22  6:21 PM  Result Value Ref Range   Sodium 137 135 - 145 mmol/L   Potassium 3.8 3.5 - 5.1 mmol/L   Chloride 102 98 - 111 mmol/L   CO2 27 22 - 32 mmol/L   Glucose, Bld 121 (H) 70 - 99 mg/dL   BUN 8 8 - 23 mg/dL   Creatinine, Ser 2.11 (H) 0.61 - 1.24 mg/dL   Calcium 8.3 (L) 8.9 - 10.3 mg/dL   GFR, Estimated 32 (L) >60 mL/min   Anion gap 8 5 - 15  Magnesium     Status: None   Collection Time: 12/13/22   6:21 PM  Result Value Ref Range   Magnesium 1.9 1.7 - 2.4 mg/dL  CBC     Status: Abnormal   Collection Time: 12/13/22  6:21 PM  Result Value Ref Range   WBC 7.9 4.0 - 10.5 K/uL   RBC 2.52 (L) 4.22 - 5.81 MIL/uL   Hemoglobin 8.1 (L) 13.0 - 17.0 g/dL   HCT 25.2 (L) 39.0 - 52.0 %   MCV 100.0 80.0 - 100.0 fL   MCH 32.1 26.0 - 34.0 pg   MCHC 32.1 30.0 - 36.0 g/dL   RDW 20.6 (H) 11.5 - 15.5 %   Platelets 172 150 - 400 K/uL   nRBC 0.0 0.0 - 0.2 %  Glucose, capillary  Status: Abnormal   Collection Time: 12/13/22  9:07 PM  Result Value Ref Range   Glucose-Capillary 122 (H) 70 - 99 mg/dL  Magnesium     Status: None   Collection Time: 12/14/22  5:22 AM  Result Value Ref Range   Magnesium 1.9 1.7 - 2.4 mg/dL  CBC     Status: Abnormal   Collection Time: 12/14/22  5:22 AM  Result Value Ref Range   WBC 8.4 4.0 - 10.5 K/uL   RBC 2.34 (L) 4.22 - 5.81 MIL/uL   Hemoglobin 7.4 (L) 13.0 - 17.0 g/dL   HCT 23.6 (L) 39.0 - 52.0 %   MCV 100.9 (H) 80.0 - 100.0 fL   MCH 31.6 26.0 - 34.0 pg   MCHC 31.4 30.0 - 36.0 g/dL   RDW 20.0 (H) 11.5 - 15.5 %   Platelets 154 150 - 400 K/uL   nRBC 0.0 0.0 - 0.2 %  Basic metabolic panel     Status: Abnormal   Collection Time: 12/14/22  5:22 AM  Result Value Ref Range   Sodium 139 135 - 145 mmol/L   Potassium 3.8 3.5 - 5.1 mmol/L   Chloride 102 98 - 111 mmol/L   CO2 29 22 - 32 mmol/L   Glucose, Bld 82 70 - 99 mg/dL   BUN 9 8 - 23 mg/dL   Creatinine, Ser 2.51 (H) 0.61 - 1.24 mg/dL   Calcium 8.7 (L) 8.9 - 10.3 mg/dL   GFR, Estimated 26 (L) >60 mL/min   Anion gap 8 5 - 15  Glucose, capillary     Status: None   Collection Time: 12/14/22  7:49 AM  Result Value Ref Range   Glucose-Capillary 84 70 - 99 mg/dL  Glucose, capillary     Status: None   Collection Time: 12/14/22 11:38 AM  Result Value Ref Range   Glucose-Capillary 98 70 - 99 mg/dL    I have Reviewed nursing notes, Vitals, and Lab results since pt's last encounter. Pertinent lab results : see  above I have ordered labwork to follow up on.  I have reviewed the last note from staff over past 24 hours I have discussed pt's care plan and test results with nursing staff, CM/SW, and other staff as appropriate  Time spent: Greater than 50% of the 55 minute visit was spent in counseling/coordination of care for the patient as laid out in the A&P.   LOS: 1 day   Dwyane Dee, MD [facility] 12/14/2022, 2:44 PM

## 2022-12-14 NOTE — Hospital Course (Signed)
[name] is a 76 y.o. male with PMH ESRD on MWF HD, HFrEF, DMII, HTN who presented with nausea.  He has a known history of cholecystitis and had undergone cholecystostomy tube placement.  His tube had become dislodged and he underwent evaluation with surgery.  He was noted to have recanalization of his bile duct and therefore the tube was not replaced. He presented to the hospital for elective cholecystectomy and underwent preop evaluation with cardiology.  His workup was notable for diffuse T wave inversions and prolonged QTc.  He was also noted to be significantly hypokalemic.  He was started on electrolyte replacement and admitted for further workup and monitoring.  Nephrology was also consulted to continue his chronic HD during hospitalization.

## 2022-12-14 NOTE — Progress Notes (Signed)
Simethicone ordered and given for gas pain with good relief.  Dressing changed to St IV sacral wound this shift.  Pt tolerated well.  Denies nausea.  BM this shift, pt declined colace at bedtime due to stool x 2 today.  Hydralazine held, outside parameters.  Repositioned for comfort.  NSR on monitor.

## 2022-12-14 NOTE — Procedures (Signed)
  Nephrology Nursing Note:  Cathflo Activase was instilled into each catheter port for overnight dwell, per Dr. Marval Regal, to optimize blood flow with HD tomorrow morning.  Rockwell Alexandria, RN

## 2022-12-15 DIAGNOSIS — N186 End stage renal disease: Secondary | ICD-10-CM | POA: Diagnosis not present

## 2022-12-15 DIAGNOSIS — I2510 Atherosclerotic heart disease of native coronary artery without angina pectoris: Secondary | ICD-10-CM | POA: Diagnosis not present

## 2022-12-15 DIAGNOSIS — R9431 Abnormal electrocardiogram [ECG] [EKG]: Secondary | ICD-10-CM | POA: Diagnosis not present

## 2022-12-15 DIAGNOSIS — K811 Chronic cholecystitis: Secondary | ICD-10-CM | POA: Diagnosis not present

## 2022-12-15 LAB — CBC
HCT: 23.2 % — ABNORMAL LOW (ref 39.0–52.0)
Hemoglobin: 7.4 g/dL — ABNORMAL LOW (ref 13.0–17.0)
MCH: 32.3 pg (ref 26.0–34.0)
MCHC: 31.9 g/dL (ref 30.0–36.0)
MCV: 101.3 fL — ABNORMAL HIGH (ref 80.0–100.0)
Platelets: 198 10*3/uL (ref 150–400)
RBC: 2.29 MIL/uL — ABNORMAL LOW (ref 4.22–5.81)
RDW: 19.2 % — ABNORMAL HIGH (ref 11.5–15.5)
WBC: 8.9 10*3/uL (ref 4.0–10.5)
nRBC: 0 % (ref 0.0–0.2)

## 2022-12-15 LAB — RENAL FUNCTION PANEL
Albumin: 2.1 g/dL — ABNORMAL LOW (ref 3.5–5.0)
Anion gap: 6 (ref 5–15)
BUN: 14 mg/dL (ref 8–23)
CO2: 30 mmol/L (ref 22–32)
Calcium: 8.9 mg/dL (ref 8.9–10.3)
Chloride: 103 mmol/L (ref 98–111)
Creatinine, Ser: 3.35 mg/dL — ABNORMAL HIGH (ref 0.61–1.24)
GFR, Estimated: 18 mL/min — ABNORMAL LOW (ref >60)
Glucose, Bld: 112 mg/dL — ABNORMAL HIGH (ref 70–99)
Phosphorus: 3.6 mg/dL (ref 2.5–4.6)
Potassium: 3.7 mmol/L (ref 3.5–5.1)
Sodium: 139 mmol/L (ref 135–145)

## 2022-12-15 LAB — GLUCOSE, CAPILLARY
Glucose-Capillary: 93 mg/dL (ref 70–99)
Glucose-Capillary: 91 mg/dL (ref 70–99)
Glucose-Capillary: 86 mg/dL (ref 70–99)
Glucose-Capillary: 153 mg/dL — ABNORMAL HIGH (ref 70–99)

## 2022-12-15 MED ORDER — HEPARIN SODIUM (PORCINE) 1000 UNIT/ML IJ SOLN
INTRAMUSCULAR | Status: AC
  Administered 2022-12-15: 1000 [IU]
  Filled 2022-12-15: qty 5

## 2022-12-15 NOTE — Progress Notes (Signed)
Progress Note    [name]   PFX:[id number]  DOB: 1946/04/12  DOA: 12/12/2022     2 PCP: Redmond School, MD  Initial CC: nausea   Hospital Course: [name] is a 76 y.o. male with PMH ESRD on MWF HD, HFrEF, DMII, HTN who presented with nausea.  He has a known history of cholecystitis and had undergone cholecystostomy tube placement.  His tube had become dislodged and he underwent evaluation with surgery.  He was noted to have recanalization of his bile duct and therefore the tube was not replaced. He presented to the hospital for elective cholecystectomy and underwent preop evaluation with cardiology.  His workup was notable for diffuse T wave inversions and prolonged QTc.  He was also noted to be significantly hypokalemic.  He was started on electrolyte replacement and admitted for further workup and monitoring.  Nephrology was also consulted to continue his chronic HD during hospitalization.  Interval History:  No events overnight.  Heading to dialysis when seen this morning. Planning to transfer patient to [facility] for ongoing cardiology evaluation given persistent T wave inversions.  Assessment and Plan:  Chronic cholecystitis -Initial plan was for cholecystectomy, although surgical plans have been canceled due to need for cardiac workup -Continue antiemetics -Patient can follow-up with general surgery once his cardiac status has been addressed   Abnormal EKG -Prolonged QTc and diffuse T wave inversions - Suspicion was due to underlying hypokalemia, however diffuse TWI persist despite normalization of potassium unless possibly a lag; otherwise patient recommended for cath if TWI persists - daily EKGs for now -Continue to monitor electrolytes - Cardiology has been following; tentative plan will be for transferring patient to have further workup; possible cath   Hypokalemia -Likely related to decreased p.o. 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 -Last echo from 07/2022 shows EF 35 to 40% -Repeat echo done on this admission shows EF of 50 to 55%, no RWMA -On GDMT, although blood pressure issues have limited treatment -Currently volume status appears compensated -Cardiology following   ESRD on HD MWF -Appreciate nephrology assistance -Regular schedule is Monday, Wednesday, Friday -continue chronic HD while in hospital   Anemia of end-stage renal disease -No reported bleeding -Also receives ESA - trend Hgb   Unstageable sacral pressure injury -Present on admission -Does not appear to have surrounding cellulitis -Continue wound care, WOC consulted   Generalized weakness -Seen by PT with recommendations for skilled nursing facility   Diabetes type 2 -Holding oral agents -Continue on sliding scale insulin -Blood sugars currently stable   Old records reviewed in assessment of this patient   DVT prophylaxis:  SCD's Start: 12/12/22 1632 Place and maintain sequential compression device Start: 12/12/22 0932   Code Status:   Code Status: Full Code  Mobility Assessment (last 72 hours)     Mobility Assessment     Row Name 12/14/22 2000 12/13/22 1159 12/13/22 0900 12/13/22 0800     Does patient have an order for bedrest or is patient medically unstable No - Continue assessment -- No - Continue assessment --    What is the highest level of mobility based on the progressive mobility assessment? Level 4 (Walks with assist in room) - Balance while marching in place and cannot step forward and back - Complete Level 4 (Walks with assist in room) - Balance while marching in place and cannot step forward and back - Complete Level 4 (Walks  with assist in room) - Balance while marching in place and cannot step forward and back - Complete Level 4 (Walks with assist in room) - Balance while marching in place and cannot step forward and back - Complete    Is the  above level different from baseline mobility prior to current illness? -- -- Yes - Recommend PT order --             Barriers to discharge:  Disposition Plan:  Eventually to SNF Status is: Inpt  Objective: Blood pressure 135/66, pulse 62, temperature 98.1 F (36.7 C), temperature source Oral, resp. rate 16, SpO2 100 %.  Examination:  Physical Exam Constitutional:      Appearance: Normal appearance.  HENT:     Head: Normocephalic and atraumatic.     Mouth/Throat:     Mouth: Mucous membranes are moist.  Eyes:     Extraocular Movements: Extraocular movements intact.  Cardiovascular:     Rate and Rhythm: Normal rate and regular rhythm.  Pulmonary:     Effort: Pulmonary effort is normal.     Breath sounds: Normal breath sounds.  Abdominal:     General: Bowel sounds are normal. There is no distension.     Palpations: Abdomen is soft.     Tenderness: There is no abdominal tenderness.  Musculoskeletal:        General: Normal range of motion.     Cervical back: Normal range of motion and neck supple.  Skin:    General: Skin is warm and dry.  Neurological:     General: No focal deficit present.     Mental Status: He is alert.  Psychiatric:        Mood and Affect: Mood normal.      Consultants:  Cardiology Nephrology  Procedures:    Data Reviewed: Results for orders placed or performed during the hospital encounter of 12/12/22 (from the past 24 hour(s))  Glucose, capillary     Status: Abnormal   Collection Time: 12/14/22  8:12 PM  Result Value Ref Range   Glucose-Capillary 147 (H) 70 - 99 mg/dL   Comment 1 Notify RN    Comment 2 Document in Chart   Glucose, capillary     Status: None   Collection Time: 12/15/22  7:26 AM  Result Value Ref Range   Glucose-Capillary 93 70 - 99 mg/dL  Renal function panel     Status: Abnormal   Collection Time: 12/15/22 11:00 AM  Result Value Ref Range   Sodium 139 135 - 145 mmol/L   Potassium 3.7 3.5 - 5.1 mmol/L   Chloride  103 98 - 111 mmol/L   CO2 30 22 - 32 mmol/L   Glucose, Bld 112 (H) 70 - 99 mg/dL   BUN 14 8 - 23 mg/dL   Creatinine, Ser 3.35 (H) 0.61 - 1.24 mg/dL   Calcium 8.9 8.9 - 10.3 mg/dL   Phosphorus 3.6 2.5 - 4.6 mg/dL   Albumin 2.1 (L) 3.5 - 5.0 g/dL   GFR, Estimated 18 (L) >60 mL/min   Anion gap 6 5 - 15  CBC     Status: Abnormal   Collection Time: 12/15/22 11:00 AM  Result Value Ref Range   WBC 8.9 4.0 - 10.5 K/uL   RBC 2.29 (L) 4.22 - 5.81 MIL/uL   Hemoglobin 7.4 (L) 13.0 - 17.0 g/dL   HCT 23.2 (L) 39.0 - 52.0 %   MCV 101.3 (H) 80.0 - 100.0 fL   MCH 32.3 26.0 -  34.0 pg   MCHC 31.9 30.0 - 36.0 g/dL   RDW 19.2 (H) 11.5 - 15.5 %   Platelets 198 150 - 400 K/uL   nRBC 0.0 0.0 - 0.2 %  Glucose, capillary     Status: None   Collection Time: 12/15/22  2:40 PM  Result Value Ref Range   Glucose-Capillary 91 70 - 99 mg/dL    I have Reviewed nursing notes, Vitals, and Lab results since pt's last encounter. Pertinent lab results : see above I have ordered labwork to follow up on.  I have reviewed the last note from staff over past 24 hours I have discussed pt's care plan and test results with nursing staff, CM/SW, and other staff as appropriate  Time spent: Greater than 50% of the 55 minute visit was spent in counseling/coordination of care for the patient as laid out in the A&P.   LOS: 2 days   Dwyane Dee, MD [facility] 12/15/2022, 4:52 PM

## 2022-12-15 NOTE — Procedures (Signed)
   HEMODIALYSIS TREATMENT NOTE:   3.75 hour heparin-free treatment completed using RIJ TDC.  Excessively negative arterial pressure compelled lowering of Qb, despite overnight Activase dwell.  CXR from 08/06/22 confirmed cath tip is near superior c/a junction; flow issues may be attributed to this, not fibrin.    Goal met: 1 liter removed without interruption in UF.  Hemodynamically stable with Afib and NSR/frequent multifocal PVCs, HR 60s.  Maintained RA sats 99-100%.  IV Fe infusion given. All blood was returned.  Pt was transported back to [facility] and-off was given to Emiliano Dyer, Therapist, sports.   Rockwell Alexandria, RN

## 2022-12-15 NOTE — Progress Notes (Signed)
Pt able to sit in chair for approx 90 minutes in evening.  Assisted back in bed x 2 assist.  After standing, pt able to do most of work getting back to bed with minimal assist.  Dressing change performed to coccyx.  No c/o ABD pain, no nausea.  Pt had 2 cups chocolate ice cream before bed.  NSR/SB on monitor.

## 2022-12-16 DIAGNOSIS — R9431 Abnormal electrocardiogram [ECG] [EKG]: Secondary | ICD-10-CM | POA: Diagnosis not present

## 2022-12-16 DIAGNOSIS — K811 Chronic cholecystitis: Secondary | ICD-10-CM | POA: Diagnosis not present

## 2022-12-16 DIAGNOSIS — N186 End stage renal disease: Secondary | ICD-10-CM | POA: Diagnosis not present

## 2022-12-16 LAB — GLUCOSE, CAPILLARY
Glucose-Capillary: 110 mg/dL — ABNORMAL HIGH (ref 70–99)
Glucose-Capillary: 85 mg/dL (ref 70–99)
Glucose-Capillary: 83 mg/dL (ref 70–99)
Glucose-Capillary: 94 mg/dL (ref 70–99)

## 2022-12-16 LAB — CBC WITH DIFFERENTIAL/PLATELET
Abs Immature Granulocytes: 0.04 10*3/uL (ref 0.00–0.07)
Basophils Absolute: 0 10*3/uL (ref 0.0–0.1)
Basophils Relative: 0 %
Eosinophils Absolute: 0.5 10*3/uL (ref 0.0–0.5)
Eosinophils Relative: 7 %
HCT: 25.1 % — ABNORMAL LOW (ref 39.0–52.0)
Hemoglobin: 7.9 g/dL — ABNORMAL LOW (ref 13.0–17.0)
Immature Granulocytes: 1 %
Lymphocytes Relative: 23 %
Lymphs Abs: 1.8 10*3/uL (ref 0.7–4.0)
MCH: 32.2 pg (ref 26.0–34.0)
MCHC: 31.5 g/dL (ref 30.0–36.0)
MCV: 102.4 fL — ABNORMAL HIGH (ref 80.0–100.0)
Monocytes Absolute: 1.2 10*3/uL — ABNORMAL HIGH (ref 0.1–1.0)
Monocytes Relative: 15 %
Neutro Abs: 4.3 10*3/uL (ref 1.7–7.7)
Neutrophils Relative %: 54 %
Platelets: 188 10*3/uL (ref 150–400)
RBC: 2.45 MIL/uL — ABNORMAL LOW (ref 4.22–5.81)
RDW: 19.2 % — ABNORMAL HIGH (ref 11.5–15.5)
WBC: 8 10*3/uL (ref 4.0–10.5)
nRBC: 0 % (ref 0.0–0.2)

## 2022-12-16 LAB — RENAL FUNCTION PANEL
Albumin: 2.1 g/dL — ABNORMAL LOW (ref 3.5–5.0)
Anion gap: 7 (ref 5–15)
BUN: 9 mg/dL (ref 8–23)
CO2: 29 mmol/L (ref 22–32)
Calcium: 8.8 mg/dL — ABNORMAL LOW (ref 8.9–10.3)
Chloride: 105 mmol/L (ref 98–111)
Creatinine, Ser: 2.21 mg/dL — ABNORMAL HIGH (ref 0.61–1.24)
GFR, Estimated: 30 mL/min — ABNORMAL LOW (ref >60)
Glucose, Bld: 87 mg/dL (ref 70–99)
Phosphorus: 2.1 mg/dL — ABNORMAL LOW (ref 2.5–4.6)
Potassium: 4.2 mmol/L (ref 3.5–5.1)
Sodium: 141 mmol/L (ref 135–145)

## 2022-12-16 LAB — MAGNESIUM: Magnesium: 1.9 mg/dL (ref 1.7–2.4)

## 2022-12-16 NOTE — Progress Notes (Signed)
Progress Note    [name]   [id number]:[id number]  DOB: 1946-08-08  DOA: 12/12/2022     3 PCP: Redmond School, MD  Initial CC: nausea   Hospital Course: [name] is a 76 y.o. male with PMH ESRD on MWF HD, HFrEF, DMII, HTN who presented with nausea.  He has a known history of cholecystitis and had undergone cholecystostomy tube placement.  His tube had become dislodged and he underwent evaluation with surgery.  He was noted to have recanalization of his bile duct and therefore the tube was not replaced. He presented to the hospital for elective cholecystectomy and underwent preop evaluation with cardiology.  His workup was notable for diffuse T wave inversions and prolonged QTc.  He was also noted to be significantly hypokalemic.  He was started on electrolyte replacement and admitted for further workup and monitoring.  Nephrology was also consulted to continue his chronic HD during hospitalization.  Interval History:  No events overnight.  No CP or SOB.  Still planning on transfer to [facility], hopefully today once beds open.   Assessment and Plan:  Chronic cholecystitis -Initial plan was for cholecystectomy, although surgical plans have been canceled due to need for cardiac workup -Continue antiemetics -Patient can follow-up with general surgery once his cardiac status has been addressed; not sure if still can have [facility] inpatient but followup cardiac workup then try to have surgery re-evaluate   Abnormal EKG -Prolonged QTc and diffuse T wave inversions - Suspicion was due to underlying hypokalemia, however diffuse TWI persist despite normalization of potassium unless possibly a lag; otherwise patient recommended for cath if TWI persists - EKG as needed -Continue to monitor electrolytes - Cardiology has been following; tentative plan will be for transferring patient to have further workup; possible cath   Hypokalemia -Likely related to decreased p.o. intake - repleting and  watching Mg as well    Cardiomyopathy/chronic systolic congestive heart failure with improved EF -Last echo from 07/2022 shows EF 35 to 40% -Repeat echo done on this admission shows EF of 50 to 55%, no RWMA -On GDMT, although blood pressure issues have limited treatment -Currently volume status appears compensated -Cardiology following   ESRD on HD MWF -Appreciate nephrology assistance -Regular schedule is Monday, Wednesday, Friday -continue chronic HD while in hospital - last HD 12/24 at [facility]   Anemia of end-stage renal disease -No reported bleeding -Also receives ESA - trend Hgb   Unstageable sacral pressure injury -Present on admission -Does not appear to have surrounding cellulitis -Continue wound care, WOC consulted   Generalized weakness -Seen by PT with recommendations for skilled nursing facility   Diabetes type 2 -Holding oral agents -Continue on sliding scale insulin -Blood sugars currently stable   Old records reviewed in assessment of this patient   DVT prophylaxis:  SCD's Start: 12/12/22 1632 Place and maintain sequential compression device Start: 12/12/22 0932   Code Status:   Code Status: Full Code  Mobility Assessment (last 72 hours)     Mobility Assessment     Row Name 12/15/22 2115 12/14/22 2000 12/13/22 1159       Does patient have an order for bedrest or is patient medically unstable No - Continue assessment No - Continue assessment --     What is the highest level of mobility based on the progressive mobility assessment? Level 4 (Walks with assist in room) - Balance while marching in place and cannot step forward and back - Complete Level 4 (Walks with assist in  room) - Balance while marching in place and cannot step forward and back - Complete Level 4 (Walks with assist in room) - Balance while marching in place and cannot step forward and back - Complete              Barriers to discharge:  Disposition Plan:  Eventually to SNF Status  is: Inpt  Objective: Blood pressure 122/70, pulse 69, temperature 98.8 F (37.1 C), resp. rate 18, SpO2 100 %.  Examination:  Physical Exam Constitutional:      Appearance: Normal appearance.  HENT:     Head: Normocephalic and atraumatic.     Mouth/Throat:     Mouth: Mucous membranes are moist.  Eyes:     Extraocular Movements: Extraocular movements intact.  Cardiovascular:     Rate and Rhythm: Normal rate and regular rhythm.  Pulmonary:     Effort: Pulmonary effort is normal.     Breath sounds: Normal breath sounds.  Abdominal:     General: Bowel sounds are normal. There is no distension.     Palpations: Abdomen is soft.     Tenderness: There is no abdominal tenderness.  Musculoskeletal:        General: Normal range of motion.     Cervical back: Normal range of motion and neck supple.  Skin:    General: Skin is warm and dry.  Neurological:     General: No focal deficit present.     Mental Status: He is alert.  Psychiatric:        Mood and Affect: Mood normal.      Consultants:  Cardiology Nephrology  Procedures:    Data Reviewed: Results for orders placed or performed during the hospital encounter of 12/12/22 (from the past 24 hour(s))  Glucose, capillary     Status: None   Collection Time: 12/15/22  2:40 PM  Result Value Ref Range   Glucose-Capillary 91 70 - 99 mg/dL  Glucose, capillary     Status: None   Collection Time: 12/15/22  5:06 PM  Result Value Ref Range   Glucose-Capillary 86 70 - 99 mg/dL  Glucose, capillary     Status: Abnormal   Collection Time: 12/15/22  9:56 PM  Result Value Ref Range   Glucose-Capillary 153 (H) 70 - 99 mg/dL  Renal function panel     Status: Abnormal   Collection Time: 12/16/22  4:45 AM  Result Value Ref Range   Sodium 141 135 - 145 mmol/L   Potassium 4.2 3.5 - 5.1 mmol/L   Chloride 105 98 - 111 mmol/L   CO2 29 22 - 32 mmol/L   Glucose, Bld 87 70 - 99 mg/dL   BUN 9 8 - 23 mg/dL   Creatinine, Ser 2.21 (H) 0.61 - 1.24  mg/dL   Calcium 8.8 (L) 8.9 - 10.3 mg/dL   Phosphorus 2.1 (L) 2.5 - 4.6 mg/dL   Albumin 2.1 (L) 3.5 - 5.0 g/dL   GFR, Estimated 30 (L) >60 mL/min   Anion gap 7 5 - 15  CBC with Differential/Platelet     Status: Abnormal   Collection Time: 12/16/22  4:45 AM  Result Value Ref Range   WBC 8.0 4.0 - 10.5 K/uL   RBC 2.45 (L) 4.22 - 5.81 MIL/uL   Hemoglobin 7.9 (L) 13.0 - 17.0 g/dL   HCT 25.1 (L) 39.0 - 52.0 %   MCV 102.4 (H) 80.0 - 100.0 fL   MCH 32.2 26.0 - 34.0 pg   MCHC 31.5 30.0 -  36.0 g/dL   RDW 19.2 (H) 11.5 - 15.5 %   Platelets 188 150 - 400 K/uL   nRBC 0.0 0.0 - 0.2 %   Neutrophils Relative % 54 %   Neutro Abs 4.3 1.7 - 7.7 K/uL   Lymphocytes Relative 23 %   Lymphs Abs 1.8 0.7 - 4.0 K/uL   Monocytes Relative 15 %   Monocytes Absolute 1.2 (H) 0.1 - 1.0 K/uL   Eosinophils Relative 7 %   Eosinophils Absolute 0.5 0.0 - 0.5 K/uL   Basophils Relative 0 %   Basophils Absolute 0.0 0.0 - 0.1 K/uL   Immature Granulocytes 1 %   Abs Immature Granulocytes 0.04 0.00 - 0.07 K/uL  Magnesium     Status: None   Collection Time: 12/16/22  4:45 AM  Result Value Ref Range   Magnesium 1.9 1.7 - 2.4 mg/dL  Glucose, capillary     Status: Abnormal   Collection Time: 12/16/22  7:48 AM  Result Value Ref Range   Glucose-Capillary 110 (H) 70 - 99 mg/dL  Glucose, capillary     Status: None   Collection Time: 12/16/22 11:26 AM  Result Value Ref Range   Glucose-Capillary 85 70 - 99 mg/dL    I have Reviewed nursing notes, Vitals, and Lab results since pt's last encounter. Pertinent lab results : see above I have ordered labwork to follow up on.  I have reviewed the last note from staff over past 24 hours I have discussed pt's care plan and test results with nursing staff, CM/SW, and other staff as appropriate  Time spent: Greater than 50% of the 55 minute visit was spent in counseling/coordination of care for the patient as laid out in the A&P.   LOS: 3 days   Dwyane Dee, MD [facility] 12/16/2022, 11:55 AM

## 2022-12-16 NOTE — Progress Notes (Signed)
[facility] called for transport. Stated it will be a little bit before patient is transferred, potentially this evening or on night shift. Made 300 Network engineer and attending RN aware.

## 2022-12-17 ENCOUNTER — Encounter (HOSPITAL_COMMUNITY)
Admission: RE | Disposition: A | Discharge status: Skilled Nursing Facility | Payer: Self-pay | Source: Home / Self Care | Attending: Family Medicine

## 2022-12-17 DIAGNOSIS — R9431 Abnormal electrocardiogram [ECG] [EKG]: Secondary | ICD-10-CM | POA: Diagnosis not present

## 2022-12-17 DIAGNOSIS — K811 Chronic cholecystitis: Secondary | ICD-10-CM | POA: Diagnosis not present

## 2022-12-17 DIAGNOSIS — Z01810 Encounter for preprocedural cardiovascular examination: Secondary | ICD-10-CM

## 2022-12-17 DIAGNOSIS — I2510 Atherosclerotic heart disease of native coronary artery without angina pectoris: Secondary | ICD-10-CM | POA: Diagnosis not present

## 2022-12-17 HISTORY — PX: LEFT HEART CATH AND CORONARY ANGIOGRAPHY: CATH118249

## 2022-12-17 LAB — RENAL FUNCTION PANEL
Albumin: 2.1 g/dL — ABNORMAL LOW (ref 3.5–5.0)
Anion gap: 7 (ref 5–15)
BUN: 17 mg/dL (ref 8–23)
CO2: 26 mmol/L (ref 22–32)
Calcium: 9.2 mg/dL (ref 8.9–10.3)
Chloride: 106 mmol/L (ref 98–111)
Creatinine, Ser: 3.34 mg/dL — ABNORMAL HIGH (ref 0.61–1.24)
GFR, Estimated: 18 mL/min — ABNORMAL LOW (ref >60)
Glucose, Bld: 104 mg/dL — ABNORMAL HIGH (ref 70–99)
Phosphorus: 3.6 mg/dL (ref 2.5–4.6)
Potassium: 4.9 mmol/L (ref 3.5–5.1)
Sodium: 139 mmol/L (ref 135–145)

## 2022-12-17 LAB — CBC WITH DIFFERENTIAL/PLATELET
Abs Immature Granulocytes: 0.03 10*3/uL (ref 0.00–0.07)
Basophils Absolute: 0.1 10*3/uL (ref 0.0–0.1)
Basophils Relative: 1 %
Eosinophils Absolute: 0.6 10*3/uL — ABNORMAL HIGH (ref 0.0–0.5)
Eosinophils Relative: 7 %
HCT: 33.7 % — ABNORMAL LOW (ref 39.0–52.0)
Hemoglobin: 10.5 g/dL — ABNORMAL LOW (ref 13.0–17.0)
Immature Granulocytes: 0 %
Lymphocytes Relative: 19 %
Lymphs Abs: 1.5 10*3/uL (ref 0.7–4.0)
MCH: 31.9 pg (ref 26.0–34.0)
MCHC: 31.2 g/dL (ref 30.0–36.0)
MCV: 102.4 fL — ABNORMAL HIGH (ref 80.0–100.0)
Monocytes Absolute: 1 10*3/uL (ref 0.1–1.0)
Monocytes Relative: 13 %
Neutro Abs: 4.7 10*3/uL (ref 1.7–7.7)
Neutrophils Relative %: 60 %
Platelets: 203 10*3/uL (ref 150–400)
RBC: 3.29 MIL/uL — ABNORMAL LOW (ref 4.22–5.81)
RDW: 19.4 % — ABNORMAL HIGH (ref 11.5–15.5)
WBC: 8 10*3/uL (ref 4.0–10.5)
nRBC: 0.3 % — ABNORMAL HIGH (ref 0.0–0.2)

## 2022-12-17 LAB — GLUCOSE, CAPILLARY
Glucose-Capillary: 82 mg/dL (ref 70–99)
Glucose-Capillary: 123 mg/dL — ABNORMAL HIGH (ref 70–99)

## 2022-12-17 LAB — CREATININE, SERUM
Creatinine, Ser: 3.38 mg/dL — ABNORMAL HIGH (ref 0.61–1.24)
GFR, Estimated: 18 mL/min — ABNORMAL LOW (ref >60)

## 2022-12-17 LAB — CBC
HCT: 28.4 % — ABNORMAL LOW (ref 39.0–52.0)
Hemoglobin: 8.7 g/dL — ABNORMAL LOW (ref 13.0–17.0)
MCH: 31.9 pg (ref 26.0–34.0)
MCHC: 30.6 g/dL (ref 30.0–36.0)
MCV: 104 fL — ABNORMAL HIGH (ref 80.0–100.0)
Platelets: 168 10*3/uL (ref 150–400)
RBC: 2.73 MIL/uL — ABNORMAL LOW (ref 4.22–5.81)
RDW: 19.3 % — ABNORMAL HIGH (ref 11.5–15.5)
WBC: 7.4 10*3/uL (ref 4.0–10.5)
nRBC: 0 % (ref 0.0–0.2)

## 2022-12-17 LAB — MAGNESIUM: Magnesium: 2 mg/dL (ref 1.7–2.4)

## 2022-12-17 LAB — SURGICAL PCR SCREEN
MRSA, PCR: NEGATIVE
Staphylococcus aureus: NEGATIVE

## 2022-12-17 SURGERY — LEFT HEART CATH AND CORONARY ANGIOGRAPHY
Anesthesia: LOCAL

## 2022-12-17 MED ORDER — ASPIRIN 81 MG PO CHEW
81.0000 mg | CHEWABLE_TABLET | ORAL | Status: AC
  Administered 2022-12-17: 81 mg via ORAL

## 2022-12-17 MED ORDER — HEPARIN (PORCINE) IN NACL 1000-0.9 UT/500ML-% IV SOLN
INTRAVENOUS | Status: DC | PRN
  Administered 2022-12-17 (×2): 500 mL

## 2022-12-17 MED ORDER — HYDRALAZINE HCL 20 MG/ML IJ SOLN: 10.0000 mg | INTRAMUSCULAR | Status: AC | PRN

## 2022-12-17 MED ORDER — LIDOCAINE HCL (PF) 1 % IJ SOLN
INTRAMUSCULAR | Status: DC | PRN
  Administered 2022-12-17: 5 mL via INTRADERMAL

## 2022-12-17 MED ORDER — IOHEXOL 350 MG/ML SOLN
INTRAVENOUS | Status: DC | PRN
  Administered 2022-12-17: 90 mL

## 2022-12-17 MED ORDER — HEPARIN SODIUM (PORCINE) 5000 UNIT/ML IJ SOLN
5000.0000 [IU] | Freq: Three times a day (TID) | INTRAMUSCULAR | Status: DC
  Administered 2022-12-17 – 2023-01-21 (×90): 5000 [IU] via SUBCUTANEOUS
  Filled 2022-12-17 (×91): qty 1

## 2022-12-17 MED ORDER — HEPARIN SODIUM (PORCINE) 1000 UNIT/ML DIALYSIS
2000.0000 [IU] | INTRAMUSCULAR | Status: DC | PRN
  Administered 2022-12-18: 2000 [IU] via INTRAVENOUS_CENTRAL
  Filled 2022-12-17 (×2): qty 2

## 2022-12-17 MED ORDER — MIDAZOLAM HCL 2 MG/2ML IJ SOLN
INTRAMUSCULAR | Status: DC | PRN
  Administered 2022-12-17: .5 mg via INTRAVENOUS

## 2022-12-17 MED ORDER — SODIUM CHLORIDE 0.9 % IV SOLN: 250.0000 mL | INTRAVENOUS | Status: DC | PRN

## 2022-12-17 MED ORDER — MIDAZOLAM HCL 2 MG/2ML IJ SOLN
INTRAMUSCULAR | Status: AC
  Filled 2022-12-17: qty 2

## 2022-12-17 MED ORDER — LIDOCAINE HCL (PF) 1 % IJ SOLN
INTRAMUSCULAR | Status: AC
  Filled 2022-12-17: qty 30

## 2022-12-17 MED ORDER — SODIUM CHLORIDE 0.9% FLUSH: 3.0000 mL | INTRAVENOUS | Status: DC | PRN

## 2022-12-17 MED ORDER — ASPIRIN 81 MG PO CHEW: 81.0000 mg | CHEWABLE_TABLET | ORAL | Status: DC

## 2022-12-17 MED ORDER — SODIUM CHLORIDE 0.9 % IV SOLN: INTRAVENOUS | Status: DC

## 2022-12-17 MED ORDER — LABETALOL HCL 5 MG/ML IV SOLN: 10.0000 mg | INTRAVENOUS | Status: AC | PRN

## 2022-12-17 MED ORDER — FENTANYL CITRATE (PF) 100 MCG/2ML IJ SOLN
INTRAMUSCULAR | Status: AC
  Filled 2022-12-17: qty 2

## 2022-12-17 MED ORDER — CHLORHEXIDINE GLUCONATE CLOTH 2 % EX PADS
6.0000 | MEDICATED_PAD | Freq: Every day | CUTANEOUS | Status: DC
  Administered 2022-12-18 – 2022-12-19 (×2): 6 via TOPICAL

## 2022-12-17 MED ORDER — SODIUM CHLORIDE 0.9% FLUSH
3.0000 mL | Freq: Two times a day (BID) | INTRAVENOUS | Status: DC
  Administered 2022-12-18 – 2022-12-24 (×10): 3 mL via INTRAVENOUS

## 2022-12-17 MED ORDER — ACETAMINOPHEN 325 MG PO TABS
650.0000 mg | ORAL_TABLET | ORAL | Status: DC | PRN
  Administered 2022-12-29 – 2023-01-21 (×6): 650 mg via ORAL
  Filled 2022-12-17 (×8): qty 2

## 2022-12-17 MED ORDER — MUPIROCIN 2 % EX OINT
1.0000 | TOPICAL_OINTMENT | Freq: Two times a day (BID) | CUTANEOUS | Status: AC
  Administered 2022-12-17 – 2022-12-21 (×8): 1 via NASAL
  Filled 2022-12-17 (×3): qty 22

## 2022-12-17 MED ORDER — SODIUM CHLORIDE 0.9% FLUSH
3.0000 mL | Freq: Two times a day (BID) | INTRAVENOUS | Status: DC
  Administered 2022-12-17 – 2023-01-21 (×57): 3 mL via INTRAVENOUS

## 2022-12-17 MED ORDER — HEPARIN (PORCINE) IN NACL 2000-0.9 UNIT/L-% IV SOLN
INTRAVENOUS | Status: AC
  Filled 2022-12-17: qty 2000

## 2022-12-17 SURGICAL SUPPLY — 13 items
CATH INFINITI 5FR MULTPACK ANG (CATHETERS) IMPLANT
GLIDESHEATH SLEND SS 6F .021 (SHEATH) IMPLANT
GUIDEWIRE INQWIRE 1.5J.035X260 (WIRE) IMPLANT
INQWIRE 1.5J .035X260CM (WIRE) ×1
KIT HEART LEFT (KITS) ×2 IMPLANT
KIT MICROPUNCTURE NIT STIFF (SHEATH) IMPLANT
PACK CARDIAC CATHETERIZATION (CUSTOM PROCEDURE TRAY) ×2 IMPLANT
SHEATH PINNACLE 5F 10CM (SHEATH) IMPLANT
SHEATH PROBE COVER 6X72 (BAG) IMPLANT
TRANSDUCER W/STOPCOCK (MISCELLANEOUS) ×2 IMPLANT
TUBING CIL FLEX 10 FLL-RA (TUBING) ×2 IMPLANT
WIRE EMERALD 3MM-J .035X150CM (WIRE) IMPLANT
WIRE EMERALD 3MM-J .035X260CM (WIRE) IMPLANT

## 2022-12-17 NOTE — Progress Notes (Signed)
Report given to [facility]. Patient picked up by [facility] for transport. Receiving Nurse Lovie Macadamia, made aware.

## 2022-12-17 NOTE — Progress Notes (Addendum)
Site area: Right groin a 5 french arterial sheath ws removed  Site Prior to Removal:  Level 0  Pressure Applied For 20 MINUTES    Bedrest Beginning at 1430pm X 4 hours  Manual:   Yes.    Patient Status During Pull:  stable  Post Pull Groin Site:  Level 0  Post Pull Instructions Given:  Yes.    Post Pull Pulses Present:  Yes.    Dressing Applied:  Yes.    Comments:

## 2022-12-17 NOTE — Progress Notes (Addendum)
Rounding Note    Patient Name: [name] Date of Encounter: 12/17/2022  [place] Cardiologist: Carlyle Dolly, MD   Subjective   No chest pain this morning. Just had breakfast  Inpatient Medications    Scheduled Meds:  allopurinol  100 mg Oral Daily   Chlorhexidine Gluconate Cloth  6 each Topical Daily   Chlorhexidine Gluconate Cloth  6 each Topical Q0600   Chlorhexidine Gluconate Cloth  6 each Topical Q0600   cycloSPORINE  1 drop Both Eyes BID   docusate sodium  100 mg Oral BID   gabapentin  300 mg Oral QID   hydrALAZINE  25 mg Oral BID   insulin aspart  0-5 Units Subcutaneous QHS   insulin aspart  0-9 Units Subcutaneous TID WC   isosorbide dinitrate  10 mg Oral BID   leptospermum manuka honey  1 Application Topical Daily   melatonin  4.5 mg Oral QHS   mupirocin ointment  1 Application Nasal BID   polyethylene glycol  17 g Oral Daily   pravastatin  40 mg Oral QPM   Continuous Infusions:  PRN Meds: acetaminophen **OR** acetaminophen, heparin, nitroGLYCERIN, polyvinyl alcohol, simethicone, traMADol   Vital Signs    Vitals:   12/17/22 0133 12/17/22 0152 12/17/22 0403 12/17/22 0827  BP: 119/73  (!) 152/85 (!) 144/72  Pulse: (!) 103  89 81  Resp:   18 17  Temp: 98 F (36.7 C)  98.2 F (36.8 C) 97.9 F (36.6 C)  TempSrc: Oral  Oral Oral  SpO2: 100%  100% 95%  Weight:  85 kg    Height:  6' 3"  (1.905 m)      Intake/Output Summary (Last 24 hours) at 12/17/2022 0904 Last data filed at 12/16/2022 1700 Gross per 24 hour  Intake 720 ml  Output --  Net 720 ml      12/17/2022    1:52 AM  Last 3 Weights  Weight (lbs) 187 lb 6.3 oz  Weight (kg) 85 kg      Telemetry    Sinus Rhythm, 1st degree AVB - Personally Reviewed  ECG    No new tracing   Physical Exam   GEN: No acute distress.   Neck: No JVD Cardiac: RRR, no murmurs, no rubs, or gallops.  Respiratory: Clear to auscultation bilaterally. GI: Soft, nontender, non-distended   MS: No edema; No deformity. Neuro:  Nonfocal  Psych: Normal affect   Labs    High Sensitivity Troponin:   Recent Labs  Lab 12/13/22 0854  TROPONINIHS 30*     Chemistry Recent Labs  Lab 12/13/22 0431 12/13/22 0745 12/14/22 0522 12/15/22 1100 12/16/22 0445 12/17/22 0404  NA 140   < > 139 139 141  --   K 3.2*   < > 3.8 3.7 4.2  --   CL 101   < > 102 103 105  --   CO2 30   < > 29 30 29   --   GLUCOSE 83   < > 82 112* 87  --   BUN 16   < > 9 14 9   --   CREATININE 3.61*   < > 2.51* 3.35* 2.21*  --   CALCIUM 9.0   < > 8.7* 8.9 8.8*  --   MG  --    < > 1.9  --  1.9 2.0  ALBUMIN 2.2*  --   --  2.1* 2.1*  --   GFRNONAA 17*   < > 26* 18* 30*  --  ANIONGAP 9   < > 8 6 7   --    < > = values in this interval not displayed.    Lipids No results for input(s): "CHOL", "TRIG", "HDL", "LABVLDL", "LDLCALC", "CHOLHDL" in the last 168 hours.  Hematology Recent Labs  Lab 12/15/22 1100 12/16/22 0445 12/17/22 0404  WBC 8.9 8.0 8.0  RBC 2.29* 2.45* 3.29*  HGB 7.4* 7.9* 10.5*  HCT 23.2* 25.1* 33.7*  MCV 101.3* 102.4* 102.4*  MCH 32.3 32.2 31.9  MCHC 31.9 31.5 31.2  RDW 19.2* 19.2* 19.4*  PLT 198 188 203   Thyroid No results for input(s): "TSH", "FREET4" in the last 168 hours.  BNPNo results for input(s): "BNP", "PROBNP" in the last 168 hours.  DDimer No results for input(s): "DDIMER" in the last 168 hours.   Radiology    No results found.  Cardiac Studies   Echo: 12/12/2022  IMPRESSIONS     1. Limited Echo to evaluate LVEF   2. Left ventricular ejection fraction, by estimation, is 50 to 55%. The  left ventricle has low normal function. The left ventricle has no regional  wall motion abnormalities. Left ventricular diastolic function could not  be evaluated.   3. The mitral valve is grossly normal. Mild mitral valve regurgitation.  No evidence of mitral stenosis.   4. The inferior vena cava is normal in size with greater than 50%  respiratory variability, suggesting  right atrial pressure of 3 mmHg.   Comparison(s): Changes from prior study are noted. LVEF improved from  35-40% in 07/2022.   FINDINGS   Left Ventricle: Left ventricular ejection fraction, by estimation, is 50  to 55%. The left ventricle has low normal function. The left ventricle has  no regional wall motion abnormalities. The left ventricular internal  cavity size was normal in size.  There is no left ventricular hypertrophy. Left ventricular diastolic  function could not be evaluated. Left ventricular diastolic function could  not be evaluated due to abnormal septal motion.   Left Atrium: Left atrial size was normal in size.   Right Atrium: Right atrial size was normal in size.   Pericardium: There is no evidence of pericardial effusion.   Mitral Valve: The mitral valve is grossly normal. Mild mitral valve  regurgitation. No evidence of mitral valve stenosis.   Tricuspid Valve: The tricuspid valve is not assessed. Tricuspid valve  regurgitation is not demonstrated. No evidence of tricuspid stenosis.   Aortic Valve: The aortic valve was not well visualized. There is mild  calcification of the aortic valve.   Pulmonic Valve: The pulmonic valve was not assessed. Pulmonic valve  regurgitation is not visualized. No evidence of pulmonic stenosis.   Aorta: The aortic root is normal in size and structure.   Venous: The inferior vena cava is normal in size with greater than 50%  respiratory variability, suggesting right atrial pressure of 3 mmHg.   IAS/Shunts: No atrial level shunt detected by color flow Doppler.   Patient Profile     76 y.o. male with a history of coronary artery disease status post prior MI and drug-eluting stent placement to the LAD and first diagonal in November 2013, cardiomyopathy (EF 35 to 40% with global hypokinesis in August 2023), hypertension, hyperlipidemia, ? Of paroxysmal atrial fibrillation, first-degree AV block, Mobitz 1 heart block, type 2  diabetes mellitus, and end-stage renal disease now on hemodialysis, who presented 12/21 for elective cholecystectomy, however case cancelled in setting of newly abnl ECG w/ deep inferior and anterolateral  TWI and prolonged QTC (582).   Assessment & Plan    Abnormal EKG/prolonged QTc/ Mobitz 1 Pre op evaluation -- presented for elective cholecystectomy and noted to be bradycardic with new inferior and anterolateral TWI with prolonged QTc in the setting of hypokalemia. EKG changes persist despite corrected electrolytes. Seen by cardiology team at [facility], Dr. Dellia Cloud with recs to transfer to [facility] for diagnostic cath prior to surgery.  -- no chest pain since admission -- ate breakfast this morning, therefore plan for cardiac cath this afternoon -- pending general surgery evaluation at [facility]   Shared Decision Making/Informed Consent The risks [stroke (1 in 1000), death (1 in 1000), kidney failure [usually temporary] (1 in 500), bleeding (1 in 200), allergic reaction [possibly serious] (1 in 200)], benefits (diagnostic support and management of coronary artery disease) and alternatives of a cardiac catheterization were discussed in detail with Mr. Lada and he is willing to proceed.  Cardiomyopathy HFrEF with improved EF -- Echo 07/2022 with LVEF of 35-40%, but now improved on limited echo 12/21 -- volume management per HD -- GDMT: limited in the setting of renal disease, continue hydralazine/nitrate. No BB in the setting of bradycardia. Unable to add SLGT2i/MRA  ESRD on HD -- per nephrology  HTN -- stable -- continue hydralazine/nitrate  Acute on chronic anemia -- H/H dropped to 6.5 while at [facility], received PRBCs with improvement to 10.5 -- follow CBC  Questionable hx of PAF -- reports indicate possible dx back 07/2022 but notes indicate sinus rhythm throughout admission. Overall, diagnosis has been unclear. So far on review of telemetry, noted to have ongoing 1st degree AVB with intermittent  mobitz 1. No definitive afib observed. -- BB was stopped at [facility] in the setting of bradycardia   HLD -- on statin    For questions or updates, please contact [place] Please consult [email or web address] for contact info under        Signed, Reino Bellis, NP  12/17/2022, 9:04 AM    I have personally seen and examined this patient. I agree with the assessment and plan as outlined above.  Pt transferred to [facility] from [facility] for cardiac cath. He presented there for cholecystectomy but given abnormal EKG this was cancelled. [facility] team at [facility] recommended cardiac cath.  Plans in place for cardiac cath today He has no chest pain or dyspnea He had breakfast today. Will plan cardiac cath after lunch today  I have reviewed the risks, indications, and alternatives to cardiac catheterization, possible angioplasty, and stenting with the patient. Risks include but are not limited to bleeding, infection, vascular injury, stroke, myocardial infection, arrhythmia, kidney injury, radiation-related injury in the case of prolonged fluoroscopy use, emergency cardiac surgery, and death. The patient understands the risks of serious complication is 1-2 in 2505 with diagnostic cardiac cath and 1-2% or less with angioplasty/stenting.   Lauree Chandler, MD, [facility] 12/17/2022 9:57 AM

## 2022-12-17 NOTE — Consult Note (Signed)
Consult Note  [name] 22-Apr-1946  [id number].    Requesting MD: [name] Gather, MD Chief Complaint/Reason for Consult: Cholecystitis  HPI:  [name] has been dealing with gallbladder issues for few months now.  On 08/20/2022, he underwent IR percutaneous cholecystostomy tube with Dr. Serafina [name].  At that time he had been seen and evaluated by Dr. Constance [name] at [facility].  He was having extreme abdominal pain in the right side and vomiting.  Around that time he was dealing with congestive heart failure and being started on dialysis, so he was not felt to be a candidate for surgery so a percutaneous catheter was placed.  Eventually, he was cleared for surgery and an elective cholecystectomy was scheduled with Dr. Constance [name] at [facility].  In workup for this procedure, EKG changes were noted preoperatively so the surgery was canceled and he was transferred to [facility] for heart catheterization with the cardiology team.  Heart catheterization was completed today, no stents or interventions were performed, and the cardiology team feels he is acceptable risk for cholecystectomy.  [name] says he has been having right upper quadrant abdominal pain nausea and vomiting since this experience in August.  He continues to complain of some pain in his right upper quadrant today even.  ROS: ROS  Family History  Problem Relation Age of Onset   Hypertension Other    Cancer Other    Heart disease Other    Diabetes Other    Hypertension Mother    Cancer Father        brain   CAD Brother    Colon cancer Neg Hx     Past Medical History:  Diagnosis Date   Anginal pain ([place])    Anxiety    Benign prostatic hypertrophy    Nocturia   CAD (coronary artery disease)    a.  NSTEMI 10/2012 s/p DES to LAD & DES to 1st diagonal with residual diffuse nonobstructive dz in LCx/RCA; b. 04/2022 MV: EF 55%, prior inferior MI. Small apical infarct w/ mild peri-infarct ischemia.   Cardiomyopathy ([place])     a. 03/2009 Echo: EF 50-55%; b. 01/2022 Echo: EF 60-65%; c. 07/2022 Echo: EF 35-40%, glob HK, worse @ apex. In Afib during study.   Degenerative joint disease    Depression    hx of   Diabetes mellitus, type II ([place]) 12/10/2012   Erectile dysfunction    ESRD (end stage renal disease) ([place])    a. HD initiated 07/2022.   Flu 2013   hx of   GERD (gastroesophageal reflux disease)    Gout    Hemorrhoids    High cholesterol    Hypertension    Exercise induced   Myocardial infarction [facility])    Neuropathy    bilateral legs   NSVT (nonsustained ventricular tachycardia) ([facility])    Exercise induced   Obesity    PAF (paroxysmal atrial fibrillation) ([place])    a. CHA2DS2VASc = 6.  No OAC   Spinal cord stimulator    Tubular adenoma 2014   Vertigo    everyday    Past Surgical History:  Procedure Laterality Date   BACK SURGERY  2007   x2   BIOPSY  02/05/2021   Procedure: BIOPSY;  Surgeon: Daneil Dolin, MD;  Location: [facility] ENDO SUITE;  Service: Endoscopy;;   CARDIAC CATHETERIZATION     with stent placement   COLONOSCOPY WITH ESOPHAGOGASTRODUODENOSCOPY (EGD) N/A 11/23/2013   Dr. Oneida Alar- TCS= left sided  colitis and mild proctitis likely due to ischemic colitis, moderate internal hemorrhoids, small nodule in the rectum, bx=tubular adenoma, EGD=-mild non-erosive gastritis   COLONOSCOPY WITH PROPOFOL N/A 07/24/2020   hemorrhoids,  rectosigmoid lipoma, small adenoma removed.   CORONARY ANGIOPLASTY     ESOPHAGOGASTRODUODENOSCOPY (EGD) WITH PROPOFOL N/A 02/05/2021   Normal esophagus, erythematous mucosa in entire stomach, no obvious ulcer, s/p biopsy. Normal duodenum. Negative H.pylori.    HERNIA REPAIR     INSERTION OF DIALYSIS CATHETER N/A 08/06/2022   Procedure: INSERTION OF DIALYSIS CATHETER;  Surgeon: Rosetta Posner, MD;  Location: [facility] ORS;  Service: Vascular;  Laterality: N/A;   IR EXCHANGE BILIARY DRAIN  10/17/2022   IR PERC CHOLECYSTOSTOMY  08/20/2022   JOINT REPLACEMENT     LEFT HEART  CATHETERIZATION WITH CORONARY ANGIOGRAM N/A 11/16/2012   Procedure: LEFT HEART CATHETERIZATION WITH CORONARY ANGIOGRAM;  Surgeon: Sherren Mocha, MD;  Location: [facility] CATH LAB;  Service: Cardiovascular;  Laterality: N/A;   PERCUTANEOUS CORONARY STENT INTERVENTION (PCI-S)  11/16/2012   Procedure: PERCUTANEOUS CORONARY STENT INTERVENTION (PCI-S);  Surgeon: Sherren Mocha, MD;  Location: [facility] CATH LAB;  Service: Cardiovascular;;   POLYPECTOMY  07/24/2020   Procedure: POLYPECTOMY;  Surgeon: Daneil Dolin, MD;  Location: [facility];  Service: Endoscopy;;   SPINAL CORD STIMULATOR INSERTION N/A 06/10/2022   Procedure: Spinal cord stimulator placement;  Surgeon: Newman Pies, MD;  Location: [place];  Service: Neurosurgery;  Laterality: N/A;  RM 19/ to follow   THUMB AMPUTATION Left    50 years ago   TOTAL KNEE ARTHROPLASTY Right 11/15/2013   Procedure: RIGHT TOTAL KNEE ARTHROPLASTY;  Surgeon: Mauri Pole, MD;  Location: [facility] ORS;  Service: Orthopedics;  Laterality: Right;   TOTAL KNEE ARTHROPLASTY Left 03/07/2015   Procedure: LEFT TOTAL KNEE ARTHROPLASTY;  Surgeon: Paralee Cancel, MD;  Location: [facility] ORS;  Service: Orthopedics;  Laterality: Left;    Social History:  reports that he quit smoking about 30 years ago. His smoking use included cigarettes. He started smoking about 57 years ago. He has a 30.00 pack-year smoking history. He has never used smokeless tobacco. He reports that he does not drink alcohol and does not use drugs.  Allergies:  Allergies  Allergen Reactions   Metformin Nausea And Vomiting   Niacin    Niacin And Related Other (See Comments)    Reaction: flushing and burning side effects even with extended release    Medications Prior to Admission  Medication Sig Dispense Refill   acarbose (PRECOSE) 50 MG tablet Take 50 mg by mouth in the morning, at noon, and at bedtime.      acetaminophen (TYLENOL) 500 MG tablet Take 2 tablets (1,000 mg total) by mouth every 6 (six) hours. 30  tablet 0   allopurinol (ZYLOPRIM) 100 MG tablet Take 100 mg by mouth daily.     ascorbic acid (VITAMIN C) 500 MG tablet Take 500 mg by mouth daily.     aspirin EC 81 MG tablet Take 1 tablet (81 mg total) by mouth daily. 90 tablet 3   calcitRIOL (ROCALTROL) 0.25 MCG capsule Take 0.25 mcg by mouth every Sunday.      carvedilol (COREG) 3.125 MG tablet Take 1 tablet (3.125 mg total) by mouth 2 (two) times daily. 60 tablet 2   docusate sodium (COLACE) 100 MG capsule Take 1 capsule (100 mg total) by mouth 2 (two) times daily. 60 capsule 2   epoetin alfa (EPOGEN) 3000 UNIT/ML injection Inject into the skin.  ferrous sulfate 325 (65 FE) MG tablet Take 1 tablet (325 mg total) by mouth daily. 30 tablet 3   gabapentin (NEURONTIN) 300 MG capsule Take 300 mg by mouth 4 (four) times daily.     hydrALAZINE (APRESOLINE) 25 MG tablet Take 1 tablet (25 mg total) by mouth in the morning and at bedtime. 60 tablet 2   insulin NPH Human (NOVOLIN N) 100 UNIT/ML injection Inject 0.1 mLs (10 Units total) into the skin every morning. Ands syringes 1/day 10 mL 11   isosorbide mononitrate (IMDUR) 30 MG 24 hr tablet Take 0.5 tablets (15 mg total) by mouth daily. 30 tablet 2   meclizine (ANTIVERT) 12.5 MG tablet Take 1 tablet (12.5 mg total) by mouth 3 (three) times daily as needed for dizziness. 30 tablet 0   melatonin 5 MG TABS Take 5 mg by mouth at bedtime.     Multiple Vitamins-Minerals (MULTIVITAMIN WITH MINERALS) tablet Take 1 tablet by mouth daily.     pravastatin (PRAVACHOL) 40 MG tablet Take 1 tablet (40 mg total) by mouth every evening. 90 tablet 3   simethicone (MYLICON) 80 MG chewable tablet Chew 80 mg by mouth every 6 (six) hours as needed for flatulence.     traMADol (ULTRAM) 50 MG tablet Take 50 mg by mouth 4 (four) times daily as needed for pain.     Zinc 50 MG TABS Take 1 tablet by mouth daily.     B Complex-C-Zn-Folic Acid (DIALYVITE 160-FUXN 15) 0.8 MG TABS Take 1 tablet by mouth daily.      carboxymethylcellulose (REFRESH PLUS) 0.5 % SOLN Place 1 drop into both eyes daily as needed (dry eyes).     nitroGLYCERIN (NITROSTAT) 0.4 MG SL tablet Place 1 tablet (0.4 mg total) under the tongue every 5 (five) minutes as needed for chest pain. PLACE ONE TABLET UNDER THE TONGUE EVERY 5 MINUTES FOR 3 DOSES AS NEEDED Strength: 0.4 mg 25 tablet 3   polyethylene glycol (MIRALAX / GLYCOLAX) 17 g packet Take 17 g by mouth daily. 14 each 0   promethazine (PHENERGAN) 25 MG suppository Place 25 mg rectally every 12 (twelve) hours as needed for nausea or vomiting.     RESTASIS 0.05 % ophthalmic emulsion Place 1 drop into both eyes 2 (two) times daily.     senna (SENOKOT) 8.6 MG TABS tablet Take 2 tablets (17.2 mg total) by mouth 2 (two) times daily. (Patient not taking: Reported on 11/28/2022) 120 tablet 0   sitaGLIPtin (JANUVIA) 25 MG tablet Take 25 mg by mouth daily.     TRUE METRIX BLOOD GLUCOSE TEST test strip USE TO TEST BLOOD SUGAR UP TO TWICE DAILY OR AS DIRECTED 180 each 3    Blood pressure (!) 157/72, pulse 84, temperature 98.1 F (36.7 C), temperature source Oral, resp. rate 17, height 6' 3"  (1.905 m), weight 85 kg, SpO2 99 %. Physical Exam:  General: pleasant, WD, male who is laying in bed on his left side, holding his right side but in no acute distress HEENT: head is normocephalic, atraumatic.  Sclera are noninjected.  PERRL.  Ears and nose without any masses or lesions.  Mouth is pink and moist Heart: regular, rate, and rhythm.  Normal s1,s2. No obvious murmurs, gallops, or rubs noted.  Palpable radial and pedal pulses bilaterally Lungs: CTAB, no wheezes, rhonchi, or rales noted.  Respiratory effort nonlabored Abd: Soft, tender left upper quadrant  MS: all 4 extremities are symmetrical with no cyanosis, clubbing, or edema. Skin: warm and  dry with no masses, lesions, or rashes Neuro: Cranial nerves 2-12 grossly intact, sensation is normal throughout Psych: A&Ox3 with an appropriate  affect.   Results for orders placed or performed during the hospital encounter of 12/12/22 (from the past 48 hour(s))  Glucose, capillary     Status: Abnormal   Collection Time: 12/15/22  9:56 PM  Result Value Ref Range   Glucose-Capillary 153 (H) 70 - 99 mg/dL    Comment: Glucose reference range applies only to samples taken after fasting for at least 8 hours.  Renal function panel     Status: Abnormal   Collection Time: 12/16/22  4:45 AM  Result Value Ref Range   Sodium 141 135 - 145 mmol/L   Potassium 4.2 3.5 - 5.1 mmol/L   Chloride 105 98 - 111 mmol/L   CO2 29 22 - 32 mmol/L   Glucose, Bld 87 70 - 99 mg/dL    Comment: Glucose reference range applies only to samples taken after fasting for at least 8 hours.   BUN 9 8 - 23 mg/dL   Creatinine, Ser 2.21 (H) 0.61 - 1.24 mg/dL    Comment: DELTA CHECK NOTED   Calcium 8.8 (L) 8.9 - 10.3 mg/dL   Phosphorus 2.1 (L) 2.5 - 4.6 mg/dL   Albumin 2.1 (L) 3.5 - 5.0 g/dL   GFR, Estimated 30 (L) >60 mL/min    Comment: (NOTE) Calculated using the CKD-EPI Creatinine Equation (2021)    Anion gap 7 5 - 15    Comment: Performed at [facility], [address], [place], [place] [address]  CBC with Differential/Platelet     Status: Abnormal   Collection Time: 12/16/22  4:45 AM  Result Value Ref Range   WBC 8.0 4.0 - 10.5 K/uL   RBC 2.45 (L) 4.22 - 5.81 MIL/uL   Hemoglobin 7.9 (L) 13.0 - 17.0 g/dL   HCT 25.1 (L) 39.0 - 52.0 %   MCV 102.4 (H) 80.0 - 100.0 fL   MCH 32.2 26.0 - 34.0 pg   MCHC 31.5 30.0 - 36.0 g/dL   RDW 19.2 (H) 11.5 - 15.5 %   Platelets 188 150 - 400 K/uL   nRBC 0.0 0.0 - 0.2 %   Neutrophils Relative % 54 %   Neutro Abs 4.3 1.7 - 7.7 K/uL   Lymphocytes Relative 23 %   Lymphs Abs 1.8 0.7 - 4.0 K/uL   Monocytes Relative 15 %   Monocytes Absolute 1.2 (H) 0.1 - 1.0 K/uL   Eosinophils Relative 7 %   Eosinophils Absolute 0.5 0.0 - 0.5 K/uL   Basophils Relative 0 %   Basophils Absolute 0.0 0.0 - 0.1 K/uL   Immature Granulocytes 1  %   Abs Immature Granulocytes 0.04 0.00 - 0.07 K/uL    Comment: Performed at [facility], [address], [place], [place] [address]  Magnesium     Status: None   Collection Time: 12/16/22  4:45 AM  Result Value Ref Range   Magnesium 1.9 1.7 - 2.4 mg/dL    Comment: Performed at [facility], [address], [place], [place] [address]  Glucose, capillary     Status: Abnormal   Collection Time: 12/16/22  7:48 AM  Result Value Ref Range   Glucose-Capillary 110 (H) 70 - 99 mg/dL    Comment: Glucose reference range applies only to samples taken after fasting for at least 8 hours.  Glucose, capillary     Status: None   Collection Time: 12/16/22 11:26 AM  Result Value Ref Range   Glucose-Capillary 85 70 - 99 mg/dL    Comment: Glucose reference range applies only to samples taken after fasting for at least 8 hours.  Glucose, capillary     Status: None   Collection Time: 12/16/22  4:45 PM  Result Value Ref Range   Glucose-Capillary 83 70 - 99 mg/dL    Comment: Glucose reference range applies only to samples taken after fasting for at least 8 hours.  Glucose, capillary     Status: None   Collection Time: 12/16/22  9:28 PM  Result Value Ref Range   Glucose-Capillary 94 70 - 99 mg/dL    Comment: Glucose reference range applies only to samples taken after fasting for at least 8 hours.  Surgical PCR screen     Status: None   Collection Time: 12/17/22  2:09 AM   Specimen: Nasal Mucosa; Nasal Swab  Result Value Ref Range   MRSA, PCR NEGATIVE NEGATIVE   Staphylococcus aureus NEGATIVE NEGATIVE    Comment: (NOTE) The Xpert SA Assay (FDA approved for NASAL specimens in patients 55 years of age and older), is one component of a comprehensive surveillance program. It is not intended to diagnose infection nor to guide or monitor treatment. Performed at [facility], [place] [address] [place], [place] [address]   CBC with Differential/Platelet     Status: Abnormal   Collection Time: 12/17/22   4:04 AM  Result Value Ref Range   WBC 8.0 4.0 - 10.5 K/uL   RBC 3.29 (L) 4.22 - 5.81 MIL/uL   Hemoglobin 10.5 (L) 13.0 - 17.0 g/dL   HCT 33.7 (L) 39.0 - 52.0 %   MCV 102.4 (H) 80.0 - 100.0 fL   MCH 31.9 26.0 - 34.0 pg   MCHC 31.2 30.0 - 36.0 g/dL   RDW 19.4 (H) 11.5 - 15.5 %   Platelets 203 150 - 400 K/uL   nRBC 0.3 (H) 0.0 - 0.2 %   Neutrophils Relative % 60 %   Neutro Abs 4.7 1.7 - 7.7 K/uL   Lymphocytes Relative 19 %   Lymphs Abs 1.5 0.7 - 4.0 K/uL   Monocytes Relative 13 %   Monocytes Absolute 1.0 0.1 - 1.0 K/uL   Eosinophils Relative 7 %   Eosinophils Absolute 0.6 (H) 0.0 - 0.5 K/uL   Basophils Relative 1 %   Basophils Absolute 0.1 0.0 - 0.1 K/uL   Immature Granulocytes 0 %   Abs Immature Granulocytes 0.03 0.00 - 0.07 K/uL    Comment: Performed at [facility], [place] [address], [place], [place] [address]  Magnesium     Status: None   Collection Time: 12/17/22  4:04 AM  Result Value Ref Range   Magnesium 2.0 1.7 - 2.4 mg/dL    Comment: Performed at [facility] [address], [place], [place] [address]  Glucose, capillary     Status: None   Collection Time: 12/17/22  2:08 PM  Result Value Ref Range   Glucose-Capillary 82 70 - 99 mg/dL    Comment: Glucose reference range applies only to samples taken after fasting for at least 8 hours.  CBC     Status: Abnormal   Collection Time: 12/17/22  6:50 PM  Result Value Ref Range   WBC 7.4 4.0 - 10.5 K/uL   RBC 2.73 (L) 4.22 - 5.81 MIL/uL   Hemoglobin 8.7 (L) 13.0 - 17.0 g/dL   HCT 28.4 (L) 39.0 - 52.0 %  MCV 104.0 (H) 80.0 - 100.0 fL   MCH 31.9 26.0 - 34.0 pg   MCHC 30.6 30.0 - 36.0 g/dL   RDW 19.3 (H) 11.5 - 15.5 %   Platelets 168 150 - 400 K/uL   nRBC 0.0 0.0 - 0.2 %    Comment: Performed at [facility] [address], [place], [place] [address]  Creatinine, serum     Status: Abnormal   Collection Time: 12/17/22  6:50 PM  Result Value Ref Range   Creatinine, Ser 3.38 (H) 0.61 - 1.24 mg/dL   GFR,  Estimated 18 (L) >60 mL/min    Comment: (NOTE) Calculated using the CKD-EPI Creatinine Equation (2021) Performed at [facility] [address] [place], [place] [address]   Renal function panel     Status: Abnormal   Collection Time: 12/17/22  7:12 PM  Result Value Ref Range   Sodium 139 135 - 145 mmol/L   Potassium 4.9 3.5 - 5.1 mmol/L   Chloride 106 98 - 111 mmol/L   CO2 26 22 - 32 mmol/L   Glucose, Bld 104 (H) 70 - 99 mg/dL    Comment: Glucose reference range applies only to samples taken after fasting for at least 8 hours.   BUN 17 8 - 23 mg/dL   Creatinine, Ser 3.34 (H) 0.61 - 1.24 mg/dL   Calcium 9.2 8.9 - 10.3 mg/dL   Phosphorus 3.6 2.5 - 4.6 mg/dL   Albumin 2.1 (L) 3.5 - 5.0 g/dL   GFR, Estimated 18 (L) >60 mL/min    Comment: (NOTE) Calculated using the CKD-EPI Creatinine Equation (2021)    Anion gap 7 5 - 15    Comment: Performed at [facility] [address] [place], [place] [address]   CARDIAC CATHETERIZATION  Result Date: 12/17/2022   Prox RCA-1 lesion is 50% stenosed. Prox RCA-2 lesion is 20% stenosed.   Mid RCA to Dist RCA lesion is 65% stenosed.   RPDA lesion is 60% stenosed.   RPAV lesion is 15% stenosed.  2nd RPL lesion is 30% stenosed.   Dist LAD-1 lesion is 55% stenosed. Dist/apical LAD-2 lesion is 75% stenosed.   Dist/apical LAD-2 lesion is 75% stenosed.   Prox 3rd Mrg is 40% stenosed   LV end diastolic pressure is normal. POST CATH DIAGNOSES Mulitvessel diffuse Moderate CAD with no acute or obvious Flow-Limiting lesions. Prox RCA eccentril 50-55%, distal RCA eccentric calcified shelf lesion ~60-65% & ostial rPDA 60% --> In tandem could potentialy be RFR/FFR positive. However, in the absence of any ongoing active anginal symptoms, would not PCI in the preop setting. Early distal LAD eccentric 55 to 60% and apical 70% stenosed this cannot amenable for PCI. Otherwise diffuse mild to moderate disease in the LCx Normal LVEDP-normal EF by Echo  RECOMMENDATIONS Aggressive to direct medical therapy for moderate diffuse CAD.  Monitor for symptoms.  But for now would simply treat medically. Would proceed with planned operation without further cardiac evaluation given nonobstructive CAD and normal echo. Glenetta Hew, MD     Assessment/Plan Chronic cholecystitis with hx of percutaneous cholecystostomy - laparoscopic cholecystectomy with Dr. Constance [name] delayed for cardiac workup/clearance - no leukocytosis presently and afebrile - abdominal exam tenderness right upper quadrant -Laparoscopic cholecystectomy may be an option while he is here in the hospital.  I discussed the procedure itself as well as its risk, benefits, and alternatives with the patient.  I explained that to be n.p.o. after midnight prior to surgery.  He would prefer not to have surgery tomorrow as he has dialysis first thing in the morning, and would rather do dialysis alone tomorrow and then surgery on Thursday.  I feel this is a reasonable request.  I explained I would discussed the patient's care with my partners to take over this general surgery service at 7:00 in the morning, and we will continue to follow the patient make plans.  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 Reviewed general surgery notes from [facility].     Felicie Morn, [facility] 12/17/2022, 8:34 PM Please see Amion for pager number during day hours  7:00am-4:30pm

## 2022-12-17 NOTE — Progress Notes (Signed)
[facility] PROGRESS NOTE  [name] (DOB: January 29, 1946) QZE:[id number] PCP: Redmond School, MD  Brief Narrative: [name] is a 76 y.o. male with PMH ESRD on MWF HD, HFrEF, T2DM, HTN who presented with nausea. He has a known history of cholecystitis and had undergone cholecystostomy tube placement.  His tube had become dislodged and he underwent evaluation with surgery.  He was noted to have recanalization of his bile duct and therefore the tube was not replaced. He presented to the hospital for elective cholecystectomy and underwent preop evaluation with cardiology.  His workup was notable for diffuse T wave inversions and prolonged QTc.  He was also noted to be significantly hypokalemic.  He was started on electrolyte replacement and admitted for further workup and monitoring. Nephrology was also consulted to continue his chronic HD during hospitalization. Despite improved metabolic parameters, ECG changes persisted, so he was transferred to [facility] for cardiac catheterization on 12/26.   Subjective: Ate breakfast, denies any abdominal pain, N/V/D. No chest pain or dyspnea or palpitations. Feels "fine."   Objective: BP (!) 130/56   Pulse 83   Temp 97.9 F (36.6 C) (Oral)   Resp 14   Ht 6' 3"  (1.905 m)   Wt 85 kg   SpO2 98%   BMI 23.42 kg/m   Gen: No distress Pulm: Clear, nonlabored  CV: RRR, no MRG GI: Soft, NT, ND, +BS Neuro: Alert and oriented. No new focal deficits. Ext: Warm, no deformities Skin: No jaundice or other rashes, lesions or ulcers on visualized skin   Assessment & Plan: Chronic cholecystitis: Causing failure to thrive, malnutrition.  - Pt presented for cholecystectomy by Dr. Constance Haw. She is not currently on call. Surgery consulted here at [facility] after cardiology cleared him for surgery. Appreciate their recommendations.  - Continue antiemetics  Chronic HFrEF, abnormal ECG: Repeat echo this admission with LVEF up to 50-55% from 35-40% in August 2023.  -  Manage volume with HD - Cotninue cardiac monitoring - LHC 12/26 per verbal report by Dr. Ellyn Hack looks ok, no intervention needed.  - Cardiology following patient  ESRD: Last HD off schedule early 12/24. Typically MWF.  - Nephrology consulted for HD.  - Renal function panel ordered today.   Anemia of ESRD:  - ESA per nephrology, seems to have received transfusion this admission. Hgb 10.5  Hypokalemia: Resolved.  - Recheck today is pending  T2DM:  - SSI  Weakness: Has been in and out of SNFs, currently SNF recommended. Pt has bed offers. Needs updated PT/OT evaluations for insurance authorization. D/w TOC today.  Unstageable sacral pressure injury, POA: Uninfected appearing.  - Offload, local wound care  Patrecia Pour, MD [facility] [email or web address] 12/17/2022, 2:43 PM

## 2022-12-17 NOTE — Progress Notes (Signed)
Heart Failure Navigator Progress Note  Assessed for Heart & Vascular TOC clinic readiness.  Patient does not meet criteria due to ESRD on hemodialysis.   Navigator will sign off at this time.    Kately Graffam, BSN, RN Heart Failure Nurse Navigator Secure Chat Only   

## 2022-12-17 NOTE — Plan of Care (Signed)

## 2022-12-17 NOTE — Consult Note (Signed)
Renal Service Consult Note [facility]  [name] 12/17/2022 [name], MD Requesting Physician: Dr. Bonner Puna  Reason for Consult: ESRD pt w/  HPI: The patient is a 76 y.o. year-old w/ PMH as below who presented w/ refractory nausea on 12/21. Prior studies suggested cholecystitis and due to high risk he had a cholecystostomy tube placed a few mos ago. He presented for elective cholecystectomy on 12/21 but surgery was canceled due to EKG changes and medical team admitted him for cardiac optimization. Low K+ was replaced and he had his HD on 12/22 and 12/ 24 (Sunday on holiday schedule). Pt was tx'd to [facility] from [facility] for heart cath. We are asked to see him for HD while here.   Pt seen in room. No c/o's except gen'd weakness.  On HD 4 mos, lives alone, nieces checks up on him and also drives him to HD.    ROS - denies CP, no joint pain, no HA, no blurry vision, no rash, no diarrhea, no nausea/ vomiting   Past Medical History  Past Medical History:  Diagnosis Date   Anginal pain (HCC)    Anxiety    Benign prostatic hypertrophy    Nocturia   CAD (coronary artery disease)    a.  NSTEMI 10/2012 s/p DES to LAD & DES to 1st diagonal with residual diffuse nonobstructive dz in LCx/RCA; b. 04/2022 MV: EF 55%, prior inferior MI. Small apical infarct w/ mild peri-infarct ischemia.   Cardiomyopathy ([place])    a. 03/2009 Echo: EF 50-55%; b. 01/2022 Echo: EF 60-65%; c. 07/2022 Echo: EF 35-40%, glob HK, worse @ apex. In Afib during study.   Degenerative joint disease    Depression    hx of   Diabetes mellitus, type II ([place]) 12/10/2012   Erectile dysfunction    ESRD (end stage renal disease) ([place])    a. HD initiated 07/2022.   Flu 2013   hx of   GERD (gastroesophageal reflux disease)    Gout    Hemorrhoids    High cholesterol    Hypertension    Exercise induced   Myocardial infarction [facility])    Neuropathy    bilateral legs   NSVT (nonsustained ventricular tachycardia)  (HCC)    Exercise induced   Obesity    PAF (paroxysmal atrial fibrillation) ([place])    a. CHA2DS2VASc = 6.  No OAC   Spinal cord stimulator    Tubular adenoma 2014   Vertigo    everyday   Past Surgical History  Past Surgical History:  Procedure Laterality Date   BACK SURGERY  2007   x2   BIOPSY  02/05/2021   Procedure: BIOPSY;  Surgeon: Daneil Dolin, MD;  Location: [facility] ENDO SUITE;  Service: Endoscopy;;   CARDIAC CATHETERIZATION     with stent placement   COLONOSCOPY WITH ESOPHAGOGASTRODUODENOSCOPY (EGD) N/A 11/23/2013   Dr. Oneida Alar- TCS= left sided colitis and mild proctitis likely due to ischemic colitis, moderate internal hemorrhoids, small nodule in the rectum, bx=tubular adenoma, EGD=-mild non-erosive gastritis   COLONOSCOPY WITH PROPOFOL N/A 07/24/2020   hemorrhoids,  rectosigmoid lipoma, small adenoma removed.   CORONARY ANGIOPLASTY     ESOPHAGOGASTRODUODENOSCOPY (EGD) WITH PROPOFOL N/A 02/05/2021   Normal esophagus, erythematous mucosa in entire stomach, no obvious ulcer, s/p biopsy. Normal duodenum. Negative H.pylori.    HERNIA REPAIR     INSERTION OF DIALYSIS CATHETER N/A 08/06/2022   Procedure: INSERTION OF DIALYSIS CATHETER;  Surgeon: Rosetta Posner, MD;  Location: [facility]  ORS;  Service: Vascular;  Laterality: N/A;   IR EXCHANGE BILIARY DRAIN  10/17/2022   IR PERC CHOLECYSTOSTOMY  08/20/2022   JOINT REPLACEMENT     LEFT HEART CATHETERIZATION WITH CORONARY ANGIOGRAM N/A 11/16/2012   Procedure: LEFT HEART CATHETERIZATION WITH CORONARY ANGIOGRAM;  Surgeon: Sherren Mocha, MD;  Location: [facility] CATH LAB;  Service: Cardiovascular;  Laterality: N/A;   PERCUTANEOUS CORONARY STENT INTERVENTION (PCI-S)  11/16/2012   Procedure: PERCUTANEOUS CORONARY STENT INTERVENTION (PCI-S);  Surgeon: Sherren Mocha, MD;  Location: [facility] CATH LAB;  Service: Cardiovascular;;   POLYPECTOMY  07/24/2020   Procedure: POLYPECTOMY;  Surgeon: Daneil Dolin, MD;  Location: [facility] ENDO SUITE;  Service: Endoscopy;;    SPINAL CORD STIMULATOR INSERTION N/A 06/10/2022   Procedure: Spinal cord stimulator placement;  Surgeon: Newman Pies, MD;  Location: [facility];  Service: Neurosurgery;  Laterality: N/A;  RM 19/ to follow   THUMB AMPUTATION Left    50 years ago   TOTAL KNEE ARTHROPLASTY Right 11/15/2013   Procedure: RIGHT TOTAL KNEE ARTHROPLASTY;  Surgeon: Mauri Pole, MD;  Location: [facility] ORS;  Service: Orthopedics;  Laterality: Right;   TOTAL KNEE ARTHROPLASTY Left 03/07/2015   Procedure: LEFT TOTAL KNEE ARTHROPLASTY;  Surgeon: Paralee Cancel, MD;  Location: [facility] ORS;  Service: Orthopedics;  Laterality: Left;   Family History  Family History  Problem Relation Age of Onset   Hypertension Other    Cancer Other    Heart disease Other    Diabetes Other    Hypertension Mother    Cancer Father        brain   CAD Brother    Colon cancer Neg Hx    Social History  reports that he quit smoking about 30 years ago. His smoking use included cigarettes. He started smoking about 57 years ago. He has a 30.00 pack-year smoking history. He has never used smokeless tobacco. He reports that he does not drink alcohol and does not use drugs. Allergies  Allergies  Allergen Reactions   Metformin Nausea And Vomiting   Niacin    Niacin And Related Other (See Comments)    Reaction: flushing and burning side effects even with extended release   Home medications Prior to Admission medications   Medication Sig Start Date End Date Taking? Authorizing Provider  acarbose (PRECOSE) 50 MG tablet Take 50 mg by mouth in the morning, at noon, and at bedtime.  10/20/17  Yes [provider]  acetaminophen (TYLENOL) 500 MG tablet Take 2 tablets (1,000 mg total) by mouth every 6 (six) hours. 08/10/22  Yes Shahmehdi, Seyed A, MD  allopurinol (ZYLOPRIM) 100 MG tablet Take 100 mg by mouth daily. 08/17/20  Yes [provider]  ascorbic acid (VITAMIN C) 500 MG tablet Take 500 mg by mouth daily.   Yes [provider]   aspirin EC 81 MG tablet Take 1 tablet (81 mg total) by mouth daily. 09/14/19  Yes Herminio Commons, MD  calcitRIOL (ROCALTROL) 0.25 MCG capsule Take 0.25 mcg by mouth every Sunday.  12/13/16  Yes [provider]  carvedilol (COREG) 3.125 MG tablet Take 1 tablet (3.125 mg total) by mouth 2 (two) times daily. 10/10/22  Yes Bhagat, Bhavinkumar, PA  docusate sodium (COLACE) 100 MG capsule Take 1 capsule (100 mg total) by mouth 2 (two) times daily. 09/16/22 09/16/23 Yes Shah, Pratik D, DO  epoetin alfa (EPOGEN) 3000 UNIT/ML injection Inject into the skin. 07/03/22  Yes [provider]  ferrous sulfate 325 (65 FE) MG tablet  Take 1 tablet (325 mg total) by mouth daily. 09/16/22 09/16/23 Yes Shah, Pratik D, DO  gabapentin (NEURONTIN) 300 MG capsule Take 300 mg by mouth 4 (four) times daily.   Yes [provider]  hydrALAZINE (APRESOLINE) 25 MG tablet Take 1 tablet (25 mg total) by mouth in the morning and at bedtime. 10/10/22  Yes Bhagat, Bhavinkumar, PA  insulin NPH Human (NOVOLIN N) 100 UNIT/ML injection Inject 0.1 mLs (10 Units total) into the skin every morning. Ands syringes 1/day 09/16/22  Yes Shah, Pratik D, DO  isosorbide mononitrate (IMDUR) 30 MG 24 hr tablet Take 0.5 tablets (15 mg total) by mouth daily. 10/10/22  Yes Bhagat, Bhavinkumar, PA  meclizine (ANTIVERT) 12.5 MG tablet Take 1 tablet (12.5 mg total) by mouth 3 (three) times daily as needed for dizziness. 09/16/22  Yes Shah, Pratik D, DO  melatonin 5 MG TABS Take 5 mg by mouth at bedtime.   Yes [provider]  Multiple Vitamins-Minerals (MULTIVITAMIN WITH MINERALS) tablet Take 1 tablet by mouth daily.   Yes [provider]  pravastatin (PRAVACHOL) 40 MG tablet Take 1 tablet (40 mg total) by mouth every evening. 07/31/21 02/09/28 Yes Imogene Burn, PA-C  simethicone (MYLICON) 80 MG chewable tablet Chew 80 mg by mouth every 6 (six) hours as needed for flatulence.   Yes [provider]   traMADol (ULTRAM) 50 MG tablet Take 50 mg by mouth 4 (four) times daily as needed for pain. 08/14/22  Yes [provider]  Zinc 50 MG TABS Take 1 tablet by mouth daily.   Yes [provider]  B Complex-C-Zn-Folic Acid (DIALYVITE 371-GGYI 15) 0.8 MG TABS Take 1 tablet by mouth daily. 09/04/22   [provider]  carboxymethylcellulose (REFRESH PLUS) 0.5 % SOLN Place 1 drop into both eyes daily as needed (dry eyes).    [provider]  nitroGLYCERIN (NITROSTAT) 0.4 MG SL tablet Place 1 tablet (0.4 mg total) under the tongue every 5 (five) minutes as needed for chest pain. PLACE ONE TABLET UNDER THE TONGUE EVERY 5 MINUTES FOR 3 DOSES AS NEEDED Strength: 0.4 mg 02/07/22   Arnoldo Lenis, MD  polyethylene glycol (MIRALAX / GLYCOLAX) 17 g packet Take 17 g by mouth daily. 08/11/22   Shahmehdi, Valeria Batman, MD  promethazine (PHENERGAN) 25 MG suppository Place 25 mg rectally every 12 (twelve) hours as needed for nausea or vomiting.    [provider]  RESTASIS 0.05 % ophthalmic emulsion Place 1 drop into both eyes 2 (two) times daily. 06/19/22   [provider]  senna (SENOKOT) 8.6 MG TABS tablet Take 2 tablets (17.2 mg total) by mouth 2 (two) times daily. Patient not taking: Reported on 11/28/2022 08/10/22   Deatra James, MD  sitaGLIPtin (JANUVIA) 25 MG tablet Take 25 mg by mouth daily.    [provider]  TRUE METRIX BLOOD GLUCOSE TEST test strip USE TO TEST BLOOD SUGAR UP TO TWICE DAILY OR AS DIRECTED 08/31/21   Renato Shin, MD     Vitals:   12/17/22 1405 12/17/22 1408 12/17/22 1415 12/17/22 1423  BP: (!) 146/57 (!) 131/57 (!) 124/53 (!) 130/56  Pulse: 86 83 83 83  Resp: 15 13 13 14   Temp:      TempSrc:      SpO2: 97% 99% 97% 98%  Weight:      Height:       Exam Gen alert, no distress, a bit cachectic No rash, cyanosis or gangrene Sclera anicteric,  throat clear  No jvd or bruits Chest clear bilat to bases, no rales/ wheezing RRR  no MRG Abd soft ntnd no mass or ascites +bs GU normal male MS no joint effusions or deformity Ext no LE or UE edema Neuro is alert, Ox 3 , nf    RIJ TDC intact    Home meds include - precose, zyloprim, vit C, coreg 3.125 mg bid, colace, gabapentin 300 qid, hydralazine 25 bid, insulin NPH, imdur 15, pravachol, sitagliptin, tramadol, vits/ supps/ prns    OP HD: [facility] MWF 4h  400/800  84.5kg  2/2.5 bath   Heparin 4000  RIJ TDC - last OP HD 12/20, post wt 85.9kg - last IP hD 12/24, post wt 85kg - mircera 225 q2, last 12/18, due 1/02 - no vdra   Assessment/ Plan: Chronic cholecystitis - w/ nausea, malnutrition, FTT.  SP cardiac cath 12/26.  Chronic HFrEF - LVEF up to 50-55% here, last was 35-40%. LHC done yesterday and no intervention needed.  ESRD - on HD MWF. HD today.  HTN/ volume - at dry wt, BP's good. 1-2 L UF w/ HD.  Anemia esrd - Hb 10s, follow.  MBD ckd - CCa and phos in range.  DM2 - per pmd    Kelly Splinter  MD 12/17/2022, 3:26 PM  Recent Labs  Lab 12/15/22 1100 12/16/22 0445 12/17/22 0404  HGB 7.4* 7.9* 10.5*  ALBUMIN 2.1* 2.1*  --   CALCIUM 8.9 8.8*  --   PHOS 3.6 2.1*  --   CREATININE 3.35* 2.21*  --   K 3.7 4.2  --    Inpatient medications:  [MAR Hold] allopurinol  100 mg Oral Daily   [MAR Hold] Chlorhexidine Gluconate Cloth  6 each Topical Daily   [MAR Hold] Chlorhexidine Gluconate Cloth  6 each Topical Q0600   [MAR Hold] Chlorhexidine Gluconate Cloth  6 each Topical Q0600   [MAR Hold] cycloSPORINE  1 drop Both Eyes BID   [MAR Hold] docusate sodium  100 mg Oral BID   [MAR Hold] gabapentin  300 mg Oral QID   [MAR Hold] hydrALAZINE  25 mg Oral BID   [MAR Hold] insulin aspart  0-5 Units Subcutaneous QHS   [MAR Hold] insulin aspart  0-9 Units Subcutaneous TID WC   [MAR Hold] isosorbide dinitrate  10 mg Oral BID   [MAR Hold] leptospermum manuka honey  1 Application Topical Daily   [MAR Hold] melatonin  4.5 mg Oral QHS   [MAR Hold] mupirocin ointment  1  Application Nasal BID   [MAR Hold] polyethylene glycol  17 g Oral Daily   [MAR Hold] pravastatin  40 mg Oral QPM   [MAR Hold] sodium chloride flush  3 mL Intravenous Q12H    sodium chloride     [MAR Hold] acetaminophen **OR** [MAR Hold] acetaminophen, Heparin (Porcine) in NaCl, [MAR Hold] heparin, iohexol, lidocaine (PF), midazolam, [MAR Hold] nitroGLYCERIN, [MAR Hold] polyvinyl alcohol, [MAR Hold] simethicone, [MAR Hold] traMADol

## 2022-12-17 NOTE — Progress Notes (Signed)
PT Cancellation Note  Patient Details Name: [name] MRN: [id number] DOB: Jan 27, 1946   Cancelled Treatment:    Reason Eval/Treat Not Completed: Patient at procedure or test/unavailable.   Leighton Roach, PT  Acute Rehab Services Secure chat preferred Office [place] 12/17/2022, 1:37 PM

## 2022-12-17 NOTE — Care Management Important Message (Signed)
Important Message  Patient Details  Name: [name] MRN: [id number] Date of Birth: [birth year]   Medicare Important Message Given:  Yes     Orbie Pyo 12/17/2022, 3:57 PM

## 2022-12-17 NOTE — Progress Notes (Signed)
Consent for heart cath signed. Pt transported off unit via bed. He remains awake alert and oriented at baseline.

## 2022-12-17 NOTE — Interval H&P Note (Signed)
History and Physical Interval Note:  12/17/2022 12:53 PM  [name]  has presented today for surgery, with the diagnosis of Abnormal EKG.  The various methods of treatment have been discussed with the patient and family. After consideration of risks, benefits and other options for treatment, the patient has consented to  Procedure(s): LEFT HEART CATH AND CORONARY ANGIOGRAPHY (N/A)  [place]   as a surgical intervention.  The patient's history has been reviewed, patient examined, no change in status, stable for surgery.  I have reviewed the patient's chart and labs.  Questions were answered to the patient's satisfaction.    Cath Lab Visit (complete for each Cath Lab visit)  Clinical Evaluation Leading to the Procedure:   ACS: No.  Non-ACS:    Anginal Classification: No Symptoms  Anti-ischemic medical therapy: Minimal Therapy (1 class of medications)  Non-Invasive Test Results: No non-invasive testing performed  Prior CABG: No previous CABG     Glenetta Hew

## 2022-12-17 NOTE — H&P (View-Only) (Signed)
Rounding Note    Patient Name: [name] Date of Encounter: 12/17/2022  [place] Cardiologist: Carlyle Dolly, MD   Subjective   No chest pain this morning. Just had breakfast  Inpatient Medications    Scheduled Meds:  allopurinol  100 mg Oral Daily   Chlorhexidine Gluconate Cloth  6 each Topical Daily   Chlorhexidine Gluconate Cloth  6 each Topical Q0600   Chlorhexidine Gluconate Cloth  6 each Topical Q0600   cycloSPORINE  1 drop Both Eyes BID   docusate sodium  100 mg Oral BID   gabapentin  300 mg Oral QID   hydrALAZINE  25 mg Oral BID   insulin aspart  0-5 Units Subcutaneous QHS   insulin aspart  0-9 Units Subcutaneous TID WC   isosorbide dinitrate  10 mg Oral BID   leptospermum manuka honey  1 Application Topical Daily   melatonin  4.5 mg Oral QHS   mupirocin ointment  1 Application Nasal BID   polyethylene glycol  17 g Oral Daily   pravastatin  40 mg Oral QPM   Continuous Infusions:  PRN Meds: acetaminophen **OR** acetaminophen, heparin, nitroGLYCERIN, polyvinyl alcohol, simethicone, traMADol   Vital Signs    Vitals:   12/17/22 0133 12/17/22 0152 12/17/22 0403 12/17/22 0827  BP: 119/73  (!) 152/85 (!) 144/72  Pulse: (!) 103  89 81  Resp:   18 17  Temp: 98 F (36.7 C)  98.2 F (36.8 C) 97.9 F (36.6 C)  TempSrc: Oral  Oral Oral  SpO2: 100%  100% 95%  Weight:  85 kg    Height:  6' 3"  (1.905 m)      Intake/Output Summary (Last 24 hours) at 12/17/2022 0904 Last data filed at 12/16/2022 1700 Gross per 24 hour  Intake 720 ml  Output --  Net 720 ml      12/17/2022    1:52 AM  Last 3 Weights  Weight (lbs) 187 lb 6.3 oz  Weight (kg) 85 kg      Telemetry    Sinus Rhythm, 1st degree AVB - Personally Reviewed  ECG    No new tracing   Physical Exam   GEN: No acute distress.   Neck: No JVD Cardiac: RRR, no murmurs, no rubs, or gallops.  Respiratory: Clear to auscultation bilaterally. GI: Soft, nontender, non-distended   MS: No edema; No deformity. Neuro:  Nonfocal  Psych: Normal affect   Labs    High Sensitivity Troponin:   Recent Labs  Lab 12/13/22 0854  TROPONINIHS 30*     Chemistry Recent Labs  Lab 12/13/22 0431 12/13/22 0745 12/14/22 0522 12/15/22 1100 12/16/22 0445 12/17/22 0404  NA 140   < > 139 139 141  --   K 3.2*   < > 3.8 3.7 4.2  --   CL 101   < > 102 103 105  --   CO2 30   < > 29 30 29   --   GLUCOSE 83   < > 82 112* 87  --   BUN 16   < > 9 14 9   --   CREATININE 3.61*   < > 2.51* 3.35* 2.21*  --   CALCIUM 9.0   < > 8.7* 8.9 8.8*  --   MG  --    < > 1.9  --  1.9 2.0  ALBUMIN 2.2*  --   --  2.1* 2.1*  --   GFRNONAA 17*   < > 26* 18* 30*  --  ANIONGAP 9   < > 8 6 7   --    < > = values in this interval not displayed.    Lipids No results for input(s): "CHOL", "TRIG", "HDL", "LABVLDL", "LDLCALC", "CHOLHDL" in the last 168 hours.  Hematology Recent Labs  Lab 12/15/22 1100 12/16/22 0445 12/17/22 0404  WBC 8.9 8.0 8.0  RBC 2.29* 2.45* 3.29*  HGB 7.4* 7.9* 10.5*  HCT 23.2* 25.1* 33.7*  MCV 101.3* 102.4* 102.4*  MCH 32.3 32.2 31.9  MCHC 31.9 31.5 31.2  RDW 19.2* 19.2* 19.4*  PLT 198 188 203   Thyroid No results for input(s): "TSH", "FREET4" in the last 168 hours.  BNPNo results for input(s): "BNP", "PROBNP" in the last 168 hours.  DDimer No results for input(s): "DDIMER" in the last 168 hours.   Radiology    No results found.  Cardiac Studies   Echo: 12/12/2022  IMPRESSIONS     1. Limited Echo to evaluate LVEF   2. Left ventricular ejection fraction, by estimation, is 50 to 55%. The  left ventricle has low normal function. The left ventricle has no regional  wall motion abnormalities. Left ventricular diastolic function could not  be evaluated.   3. The mitral valve is grossly normal. Mild mitral valve regurgitation.  No evidence of mitral stenosis.   4. The inferior vena cava is normal in size with greater than 50%  respiratory variability, suggesting  right atrial pressure of 3 mmHg.   Comparison(s): Changes from prior study are noted. LVEF improved from  35-40% in 07/2022.   FINDINGS   Left Ventricle: Left ventricular ejection fraction, by estimation, is 50  to 55%. The left ventricle has low normal function. The left ventricle has  no regional wall motion abnormalities. The left ventricular internal  cavity size was normal in size.  There is no left ventricular hypertrophy. Left ventricular diastolic  function could not be evaluated. Left ventricular diastolic function could  not be evaluated due to abnormal septal motion.   Left Atrium: Left atrial size was normal in size.   Right Atrium: Right atrial size was normal in size.   Pericardium: There is no evidence of pericardial effusion.   Mitral Valve: The mitral valve is grossly normal. Mild mitral valve  regurgitation. No evidence of mitral valve stenosis.   Tricuspid Valve: The tricuspid valve is not assessed. Tricuspid valve  regurgitation is not demonstrated. No evidence of tricuspid stenosis.   Aortic Valve: The aortic valve was not well visualized. There is mild  calcification of the aortic valve.   Pulmonic Valve: The pulmonic valve was not assessed. Pulmonic valve  regurgitation is not visualized. No evidence of pulmonic stenosis.   Aorta: The aortic root is normal in size and structure.   Venous: The inferior vena cava is normal in size with greater than 50%  respiratory variability, suggesting right atrial pressure of 3 mmHg.   IAS/Shunts: No atrial level shunt detected by color flow Doppler.   Patient Profile     76 y.o. male with a history of coronary artery disease status post prior MI and drug-eluting stent placement to the LAD and first diagonal in November 2013, cardiomyopathy (EF 35 to 40% with global hypokinesis in August 2023), hypertension, hyperlipidemia, ? Of paroxysmal atrial fibrillation, first-degree AV block, Mobitz 1 heart block, type 2  diabetes mellitus, and end-stage renal disease now on hemodialysis, who presented 12/21 for elective cholecystectomy, however case cancelled in setting of newly abnl ECG w/ deep inferior and anterolateral  TWI and prolonged QTC (582).   Assessment & Plan    Abnormal EKG/prolonged QTc/ Mobitz 1 Pre op evaluation -- presented for elective cholecystectomy and noted to be bradycardic with new inferior and anterolateral TWI with prolonged QTc in the setting of hypokalemia. EKG changes persist despite corrected electrolytes. Seen by cardiology team at [facility], Dr. Dellia Cloud with recs to transfer to [facility] for diagnostic cath prior to surgery.  -- no chest pain since admission -- ate breakfast this morning, therefore plan for cardiac cath this afternoon -- pending general surgery evaluation at [facility]   Shared Decision Making/Informed Consent The risks [stroke (1 in 1000), death (1 in 1000), kidney failure [usually temporary] (1 in 500), bleeding (1 in 200), allergic reaction [possibly serious] (1 in 200)], benefits (diagnostic support and management of coronary artery disease) and alternatives of a cardiac catheterization were discussed in detail with [name] and he is willing to proceed.  Cardiomyopathy HFrEF with improved EF -- Echo 07/2022 with LVEF of 35-40%, but now improved on limited echo 12/21 -- volume management per HD -- GDMT: limited in the setting of renal disease, continue hydralazine/nitrate. No BB in the setting of bradycardia. Unable to add SLGT2i/MRA  ESRD on HD -- per nephrology  HTN -- stable -- continue hydralazine/nitrate  Acute on chronic anemia -- H/H dropped to 6.5 while at [facility], received PRBCs with improvement to 10.5 -- follow CBC  Questionable hx of PAF -- reports indicate possible dx back 07/2022 but notes indicate sinus rhythm throughout admission. Overall, diagnosis has been unclear. So far on review of telemetry, noted to have ongoing 1st degree AVB with intermittent  mobitz 1. No definitive afib observed. -- BB was stopped at [facility] in the setting of bradycardia   HLD -- on statin    For questions or updates, please contact [place] Please consult [email or web address] for contact info under        Signed, Reino Bellis, NP  12/17/2022, 9:04 AM    I have personally seen and examined this patient. I agree with the assessment and plan as outlined above.  Pt transferred to [facility] from [facility] for cardiac cath. He presented there for cholecystectomy but given abnormal EKG this was cancelled. [facility] team at [facility] recommended cardiac cath.  Plans in place for cardiac cath today He has no chest pain or dyspnea He had breakfast today. Will plan cardiac cath after lunch today  I have reviewed the risks, indications, and alternatives to cardiac catheterization, possible angioplasty, and stenting with the patient. Risks include but are not limited to bleeding, infection, vascular injury, stroke, myocardial infection, arrhythmia, kidney injury, radiation-related injury in the case of prolonged fluoroscopy use, emergency cardiac surgery, and death. The patient understands the risks of serious complication is 1-2 in 0623 with diagnostic cardiac cath and 1-2% or less with angioplasty/stenting.   Lauree Chandler, MD, [facility] 12/17/2022 9:57 AM

## 2022-12-18 ENCOUNTER — Encounter (HOSPITAL_COMMUNITY): Payer: Self-pay | Admitting: Cardiology

## 2022-12-18 DIAGNOSIS — K811 Chronic cholecystitis: Secondary | ICD-10-CM | POA: Diagnosis not present

## 2022-12-18 DIAGNOSIS — I2510 Atherosclerotic heart disease of native coronary artery without angina pectoris: Secondary | ICD-10-CM | POA: Diagnosis not present

## 2022-12-18 LAB — GLUCOSE, CAPILLARY
Glucose-Capillary: 83 mg/dL (ref 70–99)
Glucose-Capillary: 98 mg/dL (ref 70–99)
Glucose-Capillary: 87 mg/dL (ref 70–99)
Glucose-Capillary: 104 mg/dL — ABNORMAL HIGH (ref 70–99)

## 2022-12-18 LAB — RENAL FUNCTION PANEL
Albumin: 1.9 g/dL — ABNORMAL LOW (ref 3.5–5.0)
Anion gap: 5 (ref 5–15)
BUN: 19 mg/dL (ref 8–23)
CO2: 28 mmol/L (ref 22–32)
Calcium: 9.2 mg/dL (ref 8.9–10.3)
Chloride: 106 mmol/L (ref 98–111)
Creatinine, Ser: 3.71 mg/dL — ABNORMAL HIGH (ref 0.61–1.24)
GFR, Estimated: 16 mL/min — ABNORMAL LOW (ref >60)
Glucose, Bld: 87 mg/dL (ref 70–99)
Phosphorus: 3.6 mg/dL (ref 2.5–4.6)
Potassium: 4.9 mmol/L (ref 3.5–5.1)
Sodium: 139 mmol/L (ref 135–145)

## 2022-12-18 LAB — MAGNESIUM: Magnesium: 1.9 mg/dL (ref 1.7–2.4)

## 2022-12-18 LAB — CBC WITH DIFFERENTIAL/PLATELET
Abs Immature Granulocytes: 0.04 10*3/uL (ref 0.00–0.07)
Basophils Absolute: 0 10*3/uL (ref 0.0–0.1)
Basophils Relative: 0 %
Eosinophils Absolute: 0.6 10*3/uL — ABNORMAL HIGH (ref 0.0–0.5)
Eosinophils Relative: 7 %
HCT: 25.3 % — ABNORMAL LOW (ref 39.0–52.0)
Hemoglobin: 8.1 g/dL — ABNORMAL LOW (ref 13.0–17.0)
Immature Granulocytes: 0 %
Lymphocytes Relative: 19 %
Lymphs Abs: 1.7 10*3/uL (ref 0.7–4.0)
MCH: 32.1 pg (ref 26.0–34.0)
MCHC: 32 g/dL (ref 30.0–36.0)
MCV: 100.4 fL — ABNORMAL HIGH (ref 80.0–100.0)
Monocytes Absolute: 1 10*3/uL (ref 0.1–1.0)
Monocytes Relative: 11 %
Neutro Abs: 5.7 10*3/uL (ref 1.7–7.7)
Neutrophils Relative %: 63 %
Platelets: 206 10*3/uL (ref 150–400)
RBC: 2.52 MIL/uL — ABNORMAL LOW (ref 4.22–5.81)
RDW: 19 % — ABNORMAL HIGH (ref 11.5–15.5)
WBC: 9.1 10*3/uL (ref 4.0–10.5)
nRBC: 0 % (ref 0.0–0.2)

## 2022-12-18 MED ORDER — ASPIRIN 81 MG PO TBEC
81.0000 mg | DELAYED_RELEASE_TABLET | Freq: Every day | ORAL | Status: DC
  Administered 2022-12-18 – 2023-01-21 (×34): 81 mg via ORAL
  Filled 2022-12-18 (×35): qty 1

## 2022-12-18 MED ORDER — ATORVASTATIN CALCIUM 40 MG PO TABS
40.0000 mg | ORAL_TABLET | Freq: Every day | ORAL | Status: DC
  Administered 2022-12-18 – 2023-01-21 (×35): 40 mg via ORAL
  Filled 2022-12-18 (×35): qty 1

## 2022-12-18 NOTE — Anesthesia Preprocedure Evaluation (Signed)
Anesthesia Evaluation  Patient identified by MRN, date of birth, ID band Patient awake    Reviewed: Allergy & Precautions, NPO status , Patient's Chart, lab work & pertinent test results  History of Anesthesia Complications Negative for: history of anesthetic complications  Airway Mallampati: II  TM Distance: >3 FB Neck ROM: Full    Dental  (+) Missing, Dental Advisory Given   Pulmonary former smoker   Pulmonary exam normal        Cardiovascular hypertension, Pt. on home beta blockers + CAD, + Cardiac Stents (2013) and +CHF  Normal cardiovascular exam+ dysrhythmias Atrial Fibrillation   Cath 12/17/22: multivessel diffuse moderate CAD with no acute or obvious flow-limiting lesions; recommend aggressive medical therapy for moderate diffuse CAD; would proceed with planned operation without further cardiac evaluation  Echo 12/12/22: EF 50-55%, mild MR    Neuro/Psych   Anxiety Depression    negative neurological ROS     GI/Hepatic Neg liver ROS,GERD  ,,Cholecystitis    Endo/Other  diabetes, Type 2, Insulin Dependent    Renal/GU ESRF and DialysisRenal disease  negative genitourinary   Musculoskeletal  (+) Arthritis ,    Abdominal   Peds  Hematology  (+) Blood dyscrasia, anemia   Anesthesia Other Findings Spinal cord stimulator  Reproductive/Obstetrics                              Anesthesia Physical Anesthesia Plan  ASA: 3  Anesthesia Plan: General   Post-op Pain Management: Tylenol PO (pre-op)*   Induction: Intravenous  PONV Risk Score and Plan: 2 and Ondansetron, Dexamethasone, Treatment may vary due to age or medical condition and Midazolam  Airway Management Planned: Oral ETT  Additional Equipment: None  Intra-op Plan:   Post-operative Plan: Extubation in OR  Informed Consent: I have reviewed the patients History and Physical, chart, labs and discussed the procedure  including the risks, benefits and alternatives for the proposed anesthesia with the patient or authorized representative who has indicated his/her understanding and acceptance.     Dental advisory given  Plan Discussed with:   Anesthesia Plan Comments:         Anesthesia Quick Evaluation

## 2022-12-18 NOTE — Progress Notes (Signed)
PROGRESS NOTE    [name]  RCV:[id number] DOB: 08-18-1946 DOA: 12/12/2022 PCP: Redmond School, MD  No chief complaint on file.   Brief Narrative:  [name] is [name] 76 y.o. male with PMH ESRD on MWF HD, HFrEF, T2DM, HTN who presented with nausea. He has [name] known history of cholecystitis and had undergone cholecystostomy tube placement.  His tube had become dislodged and he underwent evaluation with surgery.  He was noted to have recanalization of his bile duct and therefore the tube was not replaced. He presented to the hospital for elective cholecystectomy and underwent preop evaluation with cardiology.  His workup was notable for diffuse T wave inversions and prolonged QTc.  He was also noted to be significantly hypokalemic.  He was started on electrolyte replacement and admitted for further workup and monitoring. Nephrology was also consulted to continue his chronic HD during hospitalization. Despite improved metabolic parameters, ECG changes persisted, so he was transferred to [facility] for cardiac catheterization on 12/26.   Assessment & Plan:   Principal Problem:   Chronic cholecystitis Active Problems:   ESRD (end stage renal disease) (HCC)   Coronary artery disease involving native coronary artery of native heart without angina pectoris   Hypokalemia   Diabetes mellitus type 2 in obese (HCC)   Anemia, normocytic normochromic   Essential hypertension   Heart failure with improved ejection fraction (HFimpEF) (HCC)   HFrEF (heart failure with reduced ejection fraction) (HCC)   Nausea and vomiting   Pressure injury of skin of sacral region   Prolonged QT interval   Generalized weakness   Abnormal EKG   Preoperative cardiovascular examination  Chronic cholecystitis: Causing failure to thrive, malnutrition.  - Pt presented for cholecystectomy by Dr. Constance Haw. She is not currently on call. Surgery consulted here at [facility] after cardiology cleared him for surgery. Appreciate their  recommendations. Plan for surgery 12/28.   Chronic HFrEF, abnormal ECG: Repeat echo this admission with LVEF up to 50-55% from 35-40% in August 2023.  - Manage volume with HD - Cotninue cardiac monitoring - LHC 12/26 with multivessel diffuse moderate CAD with no acute or obvious flow limiting lesions (in absence of any active anginal symptoms, would not PCI in preop setting)  - recommending medical therapy, monitor for symptoms, ok to proceed with operation - high dose statin - Cardiology following patient   ESRD: Last HD off schedule early 12/24. Typically MWF.  - Nephrology consulted for HD.  - Renal function panel ordered today.    Anemia of ESRD:  - ESA per nephrology - appears to have received 1 unit pRBC during this hospitalization    Hypokalemia: Resolved.    T2DM:  A1c 4.4 (likely reflecting his recent failure to thrive, poor PO intake) D/c SSI   Weakness: Has been in and out of SNFs, currently SNF recommended. Pt has bed offers. Needs updated PT/OT evaluations for insurance authorization.  Follow therapy evaluations   Unstageable sacral pressure injury, POA: Uninfected appearing.  - Offload, local wound care     DVT prophylaxis: heparin Code Status: full Family Communication: brother [name]), called both nieces, but no answer Disposition:   Status is: Inpatient Remains inpatient appropriate because: continued   Consultants:  Renal Surgery cardiology  Procedures:  LHC   Prox RCA-1 lesion is 50% stenosed. Prox RCA-2 lesion is 20% stenosed.   Mid RCA to Dist RCA lesion is 65% stenosed.   RPDA lesion is 60% stenosed.   RPAV lesion is 15% stenosed.  2nd RPL lesion is 30% stenosed.   Dist LAD-1 lesion is 55% stenosed. Dist/apical LAD-2 lesion is 75% stenosed.   Dist/apical LAD-2 lesion is 75% stenosed.   Prox 3rd Mrg is 40% stenosed   LV end diastolic pressure is normal.   POST CATH DIAGNOSES Mulitvessel diffuse Moderate CAD with no acute or obvious  Flow-Limiting lesions. Prox RCA eccentril 50-55%, distal RCA eccentric calcified shelf lesion ~60-65% & ostial rPDA 60% --> In tandem could potentialy be RFR/FFR positive. However, in the absence of any ongoing active anginal symptoms, would not PCI in the preop setting. Early distal LAD eccentric 55 to 60% and apical 70% stenosed this cannot amenable for PCI. Otherwise diffuse mild to moderate disease in the LCx Normal LVEDP-normal EF by Echo      RECOMMENDATIONS Aggressive to direct medical therapy for moderate diffuse CAD.  Monitor for symptoms.  But for now would simply treat medically. Would proceed with planned operation without further cardiac evaluation given nonobstructive CAD and normal echo.   Echo IMPRESSIONS     1. Limited Echo to evaluate LVEF   2. Left ventricular ejection fraction, by estimation, is 50 to 55%. The  left ventricle has low normal function. The left ventricle has no regional  wall motion abnormalities. Left ventricular diastolic function could not  be evaluated.   3. The mitral valve is grossly normal. Mild mitral valve regurgitation.  No evidence of mitral stenosis.   4. The inferior vena cava is normal in size with greater than 50%  respiratory variability, suggesting right atrial pressure of 3 mmHg.   Comparison(s): Changes from prior study are noted. LVEF improved from  35-40% in 07/2022.   Antimicrobials:  Anti-infectives (From admission, onward)    Start     Dose/Rate Route Frequency Ordered Stop   12/13/22 0600  cefoTEtan (CEFOTAN) 2 g in sodium chloride 0.9 % 100 mL IVPB  Status:  Discontinued        2 g 200 mL/hr over 30 Minutes Intravenous On call to O.R. 12/12/22 1631 12/14/22 0559   12/12/22 0615  cefoTEtan (CEFOTAN) 2 g in sodium chloride 0.9 % 100 mL IVPB  Status:  Discontinued        2 g 200 mL/hr over 30 Minutes Intravenous On call to O.R. 12/12/22 0614 12/12/22 0913       Subjective: No new complaints  Objective: Vitals:    12/18/22 1600 12/18/22 1630 12/18/22 1658 12/18/22 1710  BP: 97/65 108/64 (!) 120/44 117/64  Pulse: (!) 45 75 73 91  Resp: 18 16 (!) 21 18  Temp:    98.5 F (36.9 C)  TempSrc:    Oral  SpO2: 100% 100% 100% 100%  Weight:    85.7 kg  Height:        Intake/Output Summary (Last 24 hours) at 12/18/2022 1833 Last data filed at 12/18/2022 1658 Gross per 24 hour  Intake --  Output 1800 ml  Net -1800 ml   Filed Weights   12/17/22 0152 12/18/22 1710  Weight: 85 kg 85.7 kg    Examination:  General: No acute distress. Cardiovascular: RRR Lungs:unlabored Abdomen:RUQ TTP Neurological: Alert and oriented 3. Moves all extremities 4 with equal strength. Cranial nerves II through XII grossly intact. Extremities: no LEE     Data Reviewed: I have personally reviewed following labs and imaging studies  CBC: Recent Labs  Lab 12/15/22 1100 12/16/22 0445 12/17/22 0404 12/17/22 1850 12/18/22 0431  WBC 8.9 8.0 8.0 7.4 9.1  NEUTROABS  --  4.3 4.7  --  5.7  HGB 7.4* 7.9* 10.5* 8.7* 8.1*  HCT 23.2* 25.1* 33.7* 28.4* 25.3*  MCV 101.3* 102.4* 102.4* 104.0* 100.4*  PLT 198 188 203 168 606    Basic Metabolic Panel: Recent Labs  Lab 12/13/22 0431 12/13/22 0745 12/13/22 1821 12/14/22 0522 12/15/22 1100 12/16/22 0445 12/17/22 0404 12/17/22 1850 12/17/22 1912 12/18/22 0431  NA 140  --  137 139 139 141  --   --  139 139  K 3.2*  --  3.8 3.8 3.7 4.2  --   --  4.9 4.9  CL 101  --  102 102 103 105  --   --  106 106  CO2 30  --  27 29 30 29   --   --  26 28  GLUCOSE 83  --  121* 82 112* 87  --   --  104* 87  BUN 16  --  8 9 14 9   --   --  17 19  CREATININE 3.61*  --  2.11* 2.51* 3.35* 2.21*  --  3.38* 3.34* 3.71*  CALCIUM 9.0  --  8.3* 8.7* 8.9 8.8*  --   --  9.2 9.2  MG  --    < > 1.9 1.9  --  1.9 2.0  --   --  1.9  PHOS 4.0  --   --   --  3.6 2.1*  --   --  3.6 3.6   < > = values in this interval not displayed.    GFR: Estimated Creatinine Clearance: 19.9 mL/min ([name]) (by C-G  formula based on SCr of 3.71 mg/dL (H)).  Liver Function Tests: Recent Labs  Lab 12/13/22 0431 12/15/22 1100 12/16/22 0445 12/17/22 1912 12/18/22 0431  ALBUMIN 2.2* 2.1* 2.1* 2.1* 1.9*    CBG: Recent Labs  Lab 12/17/22 1408 12/17/22 2122 12/18/22 0757 12/18/22 1206 12/18/22 1822  GLUCAP 82 123* 83 98 87     Recent Results (from the past 240 hour(s))  Surgical pcr screen     Status: None   Collection Time: 12/10/22 12:17 PM   Specimen: Nasal Mucosa; Nasal Swab  Result Value Ref Range Status   MRSA, PCR NEGATIVE NEGATIVE Final   Staphylococcus aureus NEGATIVE NEGATIVE Final    Comment: (NOTE) The Xpert SA Assay (FDA approved for NASAL specimens in patients 72 years of age and older), is one component of [name] comprehensive surveillance program. It is not intended to diagnose infection nor to guide or monitor treatment. Performed at [facility], [address], [place], [place] [address]   Surgical PCR screen     Status: None   Collection Time: 12/17/22  2:09 AM   Specimen: Nasal Mucosa; Nasal Swab  Result Value Ref Range Status   MRSA, PCR NEGATIVE NEGATIVE Final   Staphylococcus aureus NEGATIVE NEGATIVE Final    Comment: (NOTE) The Xpert SA Assay (FDA approved for NASAL specimens in patients 10 years of age and older), is one component of [name] comprehensive surveillance program. It is not intended to diagnose infection nor to guide or monitor treatment. Performed at [facility], [place] [address], [place], [place] [address]          Radiology Studies: CARDIAC CATHETERIZATION  Result Date: 12/17/2022   Prox RCA-1 lesion is 50% stenosed. Prox RCA-2 lesion is 20% stenosed.   Mid RCA to Dist RCA lesion is 65% stenosed.   RPDA lesion is 60% stenosed.   RPAV lesion is 15% stenosed.  2nd RPL lesion is 30% stenosed.   Dist LAD-1 lesion is 55% stenosed. Dist/apical LAD-2 lesion is 75% stenosed.   Dist/apical LAD-2 lesion is 75% stenosed.   Prox 3rd Mrg is 40%  stenosed   LV end diastolic pressure is normal. POST CATH DIAGNOSES Mulitvessel diffuse Moderate CAD with no acute or obvious Flow-Limiting lesions. Prox RCA eccentril 50-55%, distal RCA eccentric calcified shelf lesion ~60-65% & ostial rPDA 60% --> In tandem could potentialy be RFR/FFR positive. However, in the absence of any ongoing active anginal symptoms, would not PCI in the preop setting. Early distal LAD eccentric 55 to 60% and apical 70% stenosed this cannot amenable for PCI. Otherwise diffuse mild to moderate disease in the LCx Normal LVEDP-normal EF by Echo RECOMMENDATIONS Aggressive to direct medical therapy for moderate diffuse CAD.  Monitor for symptoms.  But for now would simply treat medically. Would proceed with planned operation without further cardiac evaluation given nonobstructive CAD and normal echo. [name] Hew, MD       Scheduled Meds:  allopurinol  100 mg Oral Daily   aspirin EC  81 mg Oral Daily   atorvastatin  40 mg Oral Daily   Chlorhexidine Gluconate Cloth  6 each Topical Daily   Chlorhexidine Gluconate Cloth  6 each Topical Q0600   Chlorhexidine Gluconate Cloth  6 each Topical Q0600   Chlorhexidine Gluconate Cloth  6 each Topical Q0600   cycloSPORINE  1 drop Both Eyes BID   docusate sodium  100 mg Oral BID   gabapentin  300 mg Oral QID   heparin  5,000 Units Subcutaneous Q8H   hydrALAZINE  25 mg Oral BID   insulin aspart  0-5 Units Subcutaneous QHS   insulin aspart  0-9 Units Subcutaneous TID WC   isosorbide dinitrate  10 mg Oral BID   leptospermum manuka honey  1 Application Topical Daily   melatonin  4.5 mg Oral QHS   mupirocin ointment  1 Application Nasal BID   polyethylene glycol  17 g Oral Daily   sodium chloride flush  3 mL Intravenous Q12H   sodium chloride flush  3 mL Intravenous Q12H   Continuous Infusions:  sodium chloride     sodium chloride       LOS: 5 days    Time spent: over 30 min    [name] Helper, MD [facility]   To contact the attending provider between 7A-7P or the covering provider during after hours 7P-7A, please log into the web site [email or web address] and access using universal [place] password for that web site. If you do not have the password, please call the hospital operator.  12/18/2022, 6:33 PM

## 2022-12-18 NOTE — TOC Progression Note (Signed)
Transition of Care [facility]) - Progression Note    Patient Details  Name: [name] MRN: [id number] Date of Birth: 1946/07/16  Transition of Care [facility]) CM/SW [place], [place] Phone Number: 12/18/2022, 2:48 PM  Clinical Narrative:     CSW called Debbie at [facility] who confirmed pt was a recent pt with them. She explained that pt went home from SNF for Christmas as he didn't want to be in SNF for the holiday. She also reported that pt was in copay days and that pt was opposed to using his income for LTC. [facility] would be able to take pt under his [place] and if pt needed LTC he would likely have to spend down assets until he qualified for [employer].    Expected Discharge Plan: [place] Barriers to Discharge: Continued Medical Work up  Expected Discharge Plan and Services In-house Referral: Clinical Social Work   Post Acute Care Choice: [place] Living arrangements for the past 2 months: [place] Determinants of Health (SDOH) Interventions SDOH Screenings   Food Insecurity: No Food Insecurity (09/13/2022)  Housing: Low Risk  (12/17/2022)  Transportation Needs: No Transportation Needs (12/17/2022)  Utilities: Not At Risk (12/17/2022)  Tobacco Use: Medium Risk (12/18/2022)    Readmission Risk Interventions    09/16/2022   12:31 PM 08/02/2022    2:23 PM  Readmission Risk Prevention Plan  Transportation Screening Complete Complete  Medication Review (RN Care Manager) Complete Complete  HRI or Home Care Consult Complete Complete  SW Recovery Care/Counseling Consult Complete Complete  Palliative Care Screening Not Applicable Not Applicable  Skilled Nursing Facility Not Applicable Complete

## 2022-12-18 NOTE — Progress Notes (Signed)
Pt receives out-pt HD at [facility] on MWF. Have contacted clinic to inquire about pt's chair time. Will assist as needed.   Melven Sartorius Renal Navigator [phone]

## 2022-12-18 NOTE — Care Management Important Message (Signed)
Important Message  Patient Details  Name: JEMIAH ELLENBURG MRN: [id number] Date of Birth: 06-11-1946   Medicare Important Message Given:  Yes     Shelda Altes 12/18/2022, 8:25 AM

## 2022-12-18 NOTE — Progress Notes (Addendum)
   12/18/22 1710  Vitals  Temp 98.5 F (36.9 C)  Temp Source Oral  BP 117/64  BP Location Left Arm  BP Method Automatic  Pulse Rate 91  Pulse Rate Source Monitor  ECG Heart Rate 91  Resp 18  Oxygen Therapy  SpO2 100 %  O2 Device Room Air  Pulse Oximetry Type Continuous   Received patient in bed to unit.  Alert and oriented.  Informed consent signed and in chart.   Treatment initiated: 1402  Treatment completed: 1658  Patient tolerated well.  Transported back to the room  Alert, without acute distress.  Hand-off given to patient's nurse.   Access used: HD cath Access issues: NA  Total UF removed: 1553m Medication(s) given: Heparin 2000 units Bolus, Heparin Dwell 3800 units Post HD VS: see above Post HD weight: 85.7kg   [facility]

## 2022-12-18 NOTE — H&P (View-Only) (Signed)
Patient ID: [name], male   DOB: 1946-04-21, 76 y.o.   MRN: [id number] [facility] Progress Note:   1 Day Post-Op   THE PLAN  For HD today.  Plan lap chole tomorrow.  Made NPO p midnight  Subjective: Mental status is alert.  Complaints minimal. Objective: Vital signs in last 24 hours: Temp:  [98 F (36.7 C)-99 F (37.2 C)] 99 F (37.2 C) (12/27 0752) Pulse Rate:  [76-98] 93 (12/27 0752) Resp:  [10-25] 16 (12/27 0752) BP: (93-157)/(45-79) 106/59 (12/27 0752) SpO2:  [96 %-100 %] 96 % (12/27 0752)  Intake/Output from previous day: 12/26 0701 - 12/27 0700 In: -  Out: 300 [Urine:300] Intake/Output this shift: No intake/output data recorded.  Physical Exam: Work of breathing is not compromised.  Abdomen is nontender  Lab Results:  Results for orders placed or performed during the hospital encounter of 12/12/22 (from the past 48 hour(s))  Glucose, capillary     Status: None   Collection Time: 12/16/22 11:26 AM  Result Value Ref Range   Glucose-Capillary 85 70 - 99 mg/dL    Comment: Glucose reference range applies only to samples taken after fasting for at least 8 hours.  Glucose, capillary     Status: None   Collection Time: 12/16/22  4:45 PM  Result Value Ref Range   Glucose-Capillary 83 70 - 99 mg/dL    Comment: Glucose reference range applies only to samples taken after fasting for at least 8 hours.  Glucose, capillary     Status: None   Collection Time: 12/16/22  9:28 PM  Result Value Ref Range   Glucose-Capillary 94 70 - 99 mg/dL    Comment: Glucose reference range applies only to samples taken after fasting for at least 8 hours.  Surgical PCR screen     Status: None   Collection Time: 12/17/22  2:09 AM   Specimen: Nasal Mucosa; Nasal Swab  Result Value Ref Range   MRSA, PCR NEGATIVE NEGATIVE   Staphylococcus aureus NEGATIVE NEGATIVE    Comment: (NOTE) The Xpert SA Assay (FDA approved for NASAL specimens in patients 67 years of age and older), is  one component of a comprehensive surveillance program. It is not intended to diagnose infection nor to guide or monitor treatment. Performed at [facility], [place] [address], [place], [place] [address]   CBC with Differential/Platelet     Status: Abnormal   Collection Time: 12/17/22  4:04 AM  Result Value Ref Range   WBC 8.0 4.0 - 10.5 K/uL   RBC 3.29 (L) 4.22 - 5.81 MIL/uL   Hemoglobin 10.5 (L) 13.0 - 17.0 g/dL   HCT 33.7 (L) 39.0 - 52.0 %   MCV 102.4 (H) 80.0 - 100.0 fL   MCH 31.9 26.0 - 34.0 pg   MCHC 31.2 30.0 - 36.0 g/dL   RDW 19.4 (H) 11.5 - 15.5 %   Platelets 203 150 - 400 K/uL   nRBC 0.3 (H) 0.0 - 0.2 %   Neutrophils Relative % 60 %   Neutro Abs 4.7 1.7 - 7.7 K/uL   Lymphocytes Relative 19 %   Lymphs Abs 1.5 0.7 - 4.0 K/uL   Monocytes Relative 13 %   Monocytes Absolute 1.0 0.1 - 1.0 K/uL   Eosinophils Relative 7 %   Eosinophils Absolute 0.6 (H) 0.0 - 0.5 K/uL   Basophils Relative 1 %   Basophils Absolute 0.1 0.0 - 0.1 K/uL   Immature Granulocytes 0 %   Abs  Immature Granulocytes 0.03 0.00 - 0.07 K/uL    Comment: Performed at [facility], [place] [address] [place], [place] [address]  Magnesium     Status: None   Collection Time: 12/17/22  4:04 AM  Result Value Ref Range   Magnesium 2.0 1.7 - 2.4 mg/dL    Comment: Performed at [facility] [address], [place], [place] [address]  Glucose, capillary     Status: None   Collection Time: 12/17/22  2:08 PM  Result Value Ref Range   Glucose-Capillary 82 70 - 99 mg/dL    Comment: Glucose reference range applies only to samples taken after fasting for at least 8 hours.  CBC     Status: Abnormal   Collection Time: 12/17/22  6:50 PM  Result Value Ref Range   WBC 7.4 4.0 - 10.5 K/uL   RBC 2.73 (L) 4.22 - 5.81 MIL/uL   Hemoglobin 8.7 (L) 13.0 - 17.0 g/dL   HCT 28.4 (L) 39.0 - 52.0 %   MCV 104.0 (H) 80.0 - 100.0 fL   MCH 31.9 26.0 - 34.0 pg   MCHC 30.6 30.0 - 36.0 g/dL   RDW 19.3 (H) 11.5 - 15.5 %    Platelets 168 150 - 400 K/uL   nRBC 0.0 0.0 - 0.2 %    Comment: Performed at [facility] [address] [place], [place] [address]  Creatinine, serum     Status: Abnormal   Collection Time: 12/17/22  6:50 PM  Result Value Ref Range   Creatinine, Ser 3.38 (H) 0.61 - 1.24 mg/dL   GFR, Estimated 18 (L) >60 mL/min    Comment: (NOTE) Calculated using the CKD-EPI Creatinine Equation (2021) Performed at [facility] [address] [place], [place] [address]   Renal function panel     Status: Abnormal   Collection Time: 12/17/22  7:12 PM  Result Value Ref Range   Sodium 139 135 - 145 mmol/L   Potassium 4.9 3.5 - 5.1 mmol/L   Chloride 106 98 - 111 mmol/L   CO2 26 22 - 32 mmol/L   Glucose, Bld 104 (H) 70 - 99 mg/dL    Comment: Glucose reference range applies only to samples taken after fasting for at least 8 hours.   BUN 17 8 - 23 mg/dL   Creatinine, Ser 3.34 (H) 0.61 - 1.24 mg/dL   Calcium 9.2 8.9 - 10.3 mg/dL   Phosphorus 3.6 2.5 - 4.6 mg/dL   Albumin 2.1 (L) 3.5 - 5.0 g/dL   GFR, Estimated 18 (L) >60 mL/min    Comment: (NOTE) Calculated using the CKD-EPI Creatinine Equation (2021)    Anion gap 7 5 - 15    Comment: Performed at [facility] [address] [place], [place] [address]  Glucose, capillary     Status: Abnormal   Collection Time: 12/17/22  9:22 PM  Result Value Ref Range   Glucose-Capillary 123 (H) 70 - 99 mg/dL    Comment: Glucose reference range applies only to samples taken after fasting for at least 8 hours.  Renal function panel     Status: Abnormal   Collection Time: 12/18/22  4:31 AM  Result Value Ref Range   Sodium 139 135 - 145 mmol/L   Potassium 4.9 3.5 - 5.1 mmol/L   Chloride 106 98 - 111 mmol/L   CO2 28 22 - 32 mmol/L   Glucose, Bld 87 70 - 99 mg/dL    Comment:  Glucose reference range applies only to samples taken after fasting for at least 8 hours.   BUN 19 8 - 23 mg/dL   Creatinine, Ser 3.71 (H) 0.61 - 1.24 mg/dL   Calcium 9.2  8.9 - 10.3 mg/dL   Phosphorus 3.6 2.5 - 4.6 mg/dL   Albumin 1.9 (L) 3.5 - 5.0 g/dL   GFR, Estimated 16 (L) >60 mL/min    Comment: (NOTE) Calculated using the CKD-EPI Creatinine Equation (2021)    Anion gap 5 5 - 15    Comment: Performed at [facility] [address], [place], [place] [address]  CBC with Differential/Platelet     Status: Abnormal   Collection Time: 12/18/22  4:31 AM  Result Value Ref Range   WBC 9.1 4.0 - 10.5 K/uL   RBC 2.52 (L) 4.22 - 5.81 MIL/uL   Hemoglobin 8.1 (L) 13.0 - 17.0 g/dL   HCT 25.3 (L) 39.0 - 52.0 %   MCV 100.4 (H) 80.0 - 100.0 fL   MCH 32.1 26.0 - 34.0 pg   MCHC 32.0 30.0 - 36.0 g/dL   RDW 19.0 (H) 11.5 - 15.5 %   Platelets 206 150 - 400 K/uL   nRBC 0.0 0.0 - 0.2 %   Neutrophils Relative % 63 %   Neutro Abs 5.7 1.7 - 7.7 K/uL   Lymphocytes Relative 19 %   Lymphs Abs 1.7 0.7 - 4.0 K/uL   Monocytes Relative 11 %   Monocytes Absolute 1.0 0.1 - 1.0 K/uL   Eosinophils Relative 7 %   Eosinophils Absolute 0.6 (H) 0.0 - 0.5 K/uL   Basophils Relative 0 %   Basophils Absolute 0.0 0.0 - 0.1 K/uL   Immature Granulocytes 0 %   Abs Immature Granulocytes 0.04 0.00 - 0.07 K/uL    Comment: Performed at [facility], [place] [address] [place], [place] [address]  Magnesium     Status: None   Collection Time: 12/18/22  4:31 AM  Result Value Ref Range   Magnesium 1.9 1.7 - 2.4 mg/dL    Comment: Performed at [facility] [address] [place], [place] [address]  Glucose, capillary     Status: None   Collection Time: 12/18/22  7:57 AM  Result Value Ref Range   Glucose-Capillary 83 70 - 99 mg/dL    Comment: Glucose reference range applies only to samples taken after fasting for at least 8 hours.    Radiology/Results: CARDIAC CATHETERIZATION  Result Date: 12/17/2022   Prox RCA-1 lesion is 50% stenosed. Prox RCA-2 lesion is 20% stenosed.   Mid RCA to Dist RCA lesion is 65% stenosed.   RPDA lesion is 60% stenosed.   RPAV lesion is 15%  stenosed.  2nd RPL lesion is 30% stenosed.   Dist LAD-1 lesion is 55% stenosed. Dist/apical LAD-2 lesion is 75% stenosed.   Dist/apical LAD-2 lesion is 75% stenosed.   Prox 3rd Mrg is 40% stenosed   LV end diastolic pressure is normal. POST CATH DIAGNOSES Mulitvessel diffuse Moderate CAD with no acute or obvious Flow-Limiting lesions. Prox RCA eccentril 50-55%, distal RCA eccentric calcified shelf lesion ~60-65% & ostial rPDA 60% --> In tandem could potentialy be RFR/FFR positive. However, in the absence of any ongoing active anginal symptoms, would not PCI in the preop setting. Early distal LAD eccentric 55 to 60% and apical 70% stenosed this cannot amenable for PCI. Otherwise diffuse mild to moderate disease in the LCx Normal LVEDP-normal EF by Echo RECOMMENDATIONS Aggressive to  direct medical therapy for moderate diffuse CAD.  Monitor for symptoms.  But for now would simply treat medically. Would proceed with planned operation without further cardiac evaluation given nonobstructive CAD and normal echo. Glenetta Hew, MD   Anti-infectives: Anti-infectives (From admission, onward)    Start     Dose/Rate Route Frequency Ordered Stop   12/13/22 0600  cefoTEtan (CEFOTAN) 2 g in sodium chloride 0.9 % 100 mL IVPB  Status:  Discontinued        2 g 200 mL/hr over 30 Minutes Intravenous On call to O.R. 12/12/22 1631 12/14/22 0559   12/12/22 0615  cefoTEtan (CEFOTAN) 2 g in sodium chloride 0.9 % 100 mL IVPB  Status:  Discontinued        2 g 200 mL/hr over 30 Minutes Intravenous On call to O.R. 12/12/22 0614 12/12/22 0913       Assessment/Plan: Problem List: Patient Active Problem List   Diagnosis Date Noted   Preoperative cardiovascular examination 12/17/2022   Abnormal EKG 12/13/2022   Nausea and vomiting 12/12/2022   Pressure injury of skin of sacral region 12/12/2022   Prolonged QT interval 12/12/2022   Generalized weakness 12/12/2022   Pressure injury of sacral region, stage 1 09/24/2022    Hypotension    Sepsis ([place]) 09/13/2022   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   ESRD (end stage renal disease) ([place]) 08/07/2022   HFrEF (heart failure with reduced ejection fraction) (HCC)    (HFpEF) heart failure with preserved ejection fraction ([place]) 07/29/2022   Hypokalemia 07/29/2022   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   Acute respiratory failure with hypoxia ([place])    B12 deficiency 07/18/2022   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/2022   Diabetic neuropathy ([place]) 05/10/2022   Elevated troponin 05/10/2022   Type 2 diabetes mellitus with hyperglycemia ([place]) 05/10/2022   Other intervertebral disc degeneration, lumbar region 02/06/2022   Chronic gout, unspecified, 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ophi) 02/06/2022   Body mass index (BMI) 35.0-35.9, adult 02/06/2022   Obesity 02/06/2022   Primary generalized (osteo)arthritis 02/06/2022   Chest pain 02/06/2022   Lumbago with sciatica, right side 08/28/2021   Weakness of both lower extremities 08/28/2021   Body mass index (BMI) 33.0-33.9, adult 08/28/2021   Constipation 02/15/2021   Abdominal pain 01/02/2021   Diabetes mellitus ([place]) 11/09/2020   Hypertensive disorder 11/09/2020   Hypercholesterolemia 11/09/2020   H/O adenomatous polyp of colon 05/05/2020   Iron deficiency anemia 05/05/2020   Chronic kidney disease, stage IV (severe) ([place]) 12/14/2019   Acute gouty arthritis 10/25/2019   Leukocytosis 04/02/2019   Cellulitis of right hand 04/01/2019   Osteoarthritis of ankle and foot 02/08/2019   Heart failure with improved ejection fraction (HFimpEF) ([place]) 07/05/2015   Acute congestive heart failure ([place]) 07/04/2015   Acute on chronic diastolic congestive heart failure ([place]) 07/04/2015   Mixed hyperlipidemia 07/04/2015   Anxiety state 07/04/2015   GERD (gastroesophageal reflux disease) 07/04/2015   Heart block 07/04/2015   Gastroesophageal reflux disease 84/66/5993   Diastolic CHF, chronic (HCC) 03/11/2015   Chronic diastolic heart failure ([place])  03/11/2015   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   Knee stiffness 01/13/2014   Gout 12/11/2013   Expected blood loss anemia 11/17/2013   S/P right TKA 11/15/2013   Anemia, normocytic normochromic 01/12/2013   Acute kidney injury superimposed on chronic kidney disease ([place]) 12/10/2012   Diabetes mellitus type 2 in obese ([place]) 12/10/2012   Coronary artery disease involving native coronary artery of native heart without angina pectoris 11/24/2012   Overweight(278.02) 11/17/2012  Chronic kidney disease, stage 3, cardiorenal syndrome 08/23/2011   Chronic kidney disease, stage 3 unspecified ([place]) 08/23/2011   Essential hypertension     For OR tomorrow for lap chole.   1 Day Post-Op    LOS: 5 days   Matt B. Hassell Done, MD, [facility] [phone] to reach the surgeon on call.    12/18/2022 9:21 AM

## 2022-12-18 NOTE — Progress Notes (Signed)
PT Cancellation Note  Patient Details Name: [name] MRN: [id number] DOB: [birth year]   Cancelled Treatment:    Reason Eval/Treat Not Completed: (P) Patient at procedure or test/unavailable Pt off floor for HD.   Meyli Boice B. Migdalia Dk PT, DPT Acute Rehabilitation Services Please use secure chat or  Call Office [phone]    [place] 12/18/2022, 1:35 PM

## 2022-12-18 NOTE — Progress Notes (Addendum)
Rounding Note    Patient Name: [name] Date of Encounter: 12/18/2022  [place] Cardiologist: Carlyle Dolly, MD   Subjective   No chest pain. Planned for lap chol tomorrow. Eating breakfast  Inpatient Medications    Scheduled Meds:  allopurinol  100 mg Oral Daily   Chlorhexidine Gluconate Cloth  6 each Topical Daily   Chlorhexidine Gluconate Cloth  6 each Topical Q0600   Chlorhexidine Gluconate Cloth  6 each Topical Q0600   Chlorhexidine Gluconate Cloth  6 each Topical Q0600   cycloSPORINE  1 drop Both Eyes BID   docusate sodium  100 mg Oral BID   gabapentin  300 mg Oral QID   heparin  5,000 Units Subcutaneous Q8H   hydrALAZINE  25 mg Oral BID   insulin aspart  0-5 Units Subcutaneous QHS   insulin aspart  0-9 Units Subcutaneous TID WC   isosorbide dinitrate  10 mg Oral BID   leptospermum manuka honey  1 Application Topical Daily   melatonin  4.5 mg Oral QHS   mupirocin ointment  1 Application Nasal BID   polyethylene glycol  17 g Oral Daily   pravastatin  40 mg Oral QPM   sodium chloride flush  3 mL Intravenous Q12H   sodium chloride flush  3 mL Intravenous Q12H   Continuous Infusions:  sodium chloride     sodium chloride     PRN Meds: sodium chloride, sodium chloride, acetaminophen, heparin, heparin, nitroGLYCERIN, polyvinyl alcohol, simethicone, sodium chloride flush, sodium chloride flush, traMADol   Vital Signs    Vitals:   12/17/22 1943 12/18/22 0212 12/18/22 0341 12/18/22 0752  BP: (!) 157/72 96/71 93/62  (!) 106/59  Pulse: 84  93 93  Resp:    16  Temp: 98.1 F (36.7 C)  98.1 F (36.7 C) 99 F (37.2 C)  TempSrc: Oral  Oral Oral  SpO2: 99%  100% 96%  Weight:      Height:        Intake/Output Summary (Last 24 hours) at 12/18/2022 0925 Last data filed at 12/18/2022 0414 Gross per 24 hour  Intake --  Output 300 ml  Net -300 ml      12/17/2022    1:52 AM  Last 3 Weights  Weight (lbs) 187 lb 6.3 oz  Weight (kg) 85 kg       Telemetry    Sinus Rhythm, mobitz 1, PVCs - Personally Reviewed  ECG    No new tracing this morning  Physical Exam   GEN: No acute distress.   Neck: No JVD Cardiac: RRR, no murmurs, rubs, or gallops.  Respiratory: Clear to auscultation bilaterally. GI: Soft, nontender, non-distended  MS: No edema; No deformity. Right radial cath site.  Neuro:  Nonfocal  Psych: Normal affect   Labs    High Sensitivity Troponin:   Recent Labs  Lab 12/13/22 0854  TROPONINIHS 30*     Chemistry Recent Labs  Lab 12/16/22 0445 12/17/22 0404 12/17/22 1850 12/17/22 1912 12/18/22 0431  NA 141  --   --  139 139  K 4.2  --   --  4.9 4.9  CL 105  --   --  106 106  CO2 29  --   --  26 28  GLUCOSE 87  --   --  104* 87  BUN 9  --   --  17 19  CREATININE 2.21*  --  3.38* 3.34* 3.71*  CALCIUM 8.8*  --   --  9.2 9.2  MG 1.9 2.0  --   --  1.9  ALBUMIN 2.1*  --   --  2.1* 1.9*  GFRNONAA 30*  --  18* 18* 16*  ANIONGAP 7  --   --  7 5    Lipids No results for input(s): "CHOL", "TRIG", "HDL", "LABVLDL", "LDLCALC", "CHOLHDL" in the last 168 hours.  Hematology Recent Labs  Lab 12/17/22 0404 12/17/22 1850 12/18/22 0431  WBC 8.0 7.4 9.1  RBC 3.29* 2.73* 2.52*  HGB 10.5* 8.7* 8.1*  HCT 33.7* 28.4* 25.3*  MCV 102.4* 104.0* 100.4*  MCH 31.9 31.9 32.1  MCHC 31.2 30.6 32.0  RDW 19.4* 19.3* 19.0*  PLT 203 168 206   Thyroid No results for input(s): "TSH", "FREET4" in the last 168 hours.  BNPNo results for input(s): "BNP", "PROBNP" in the last 168 hours.  DDimer No results for input(s): "DDIMER" in the last 168 hours.   Radiology    CARDIAC CATHETERIZATION  Result Date: 12/17/2022   Prox RCA-1 lesion is 50% stenosed. Prox RCA-2 lesion is 20% stenosed.   Mid RCA to Dist RCA lesion is 65% stenosed.   RPDA lesion is 60% stenosed.   RPAV lesion is 15% stenosed.  2nd RPL lesion is 30% stenosed.   Dist LAD-1 lesion is 55% stenosed. Dist/apical LAD-2 lesion is 75% stenosed.   Dist/apical LAD-2  lesion is 75% stenosed.   Prox 3rd Mrg is 40% stenosed   LV end diastolic pressure is normal. POST CATH DIAGNOSES Mulitvessel diffuse Moderate CAD with no acute or obvious Flow-Limiting lesions. Prox RCA eccentril 50-55%, distal RCA eccentric calcified shelf lesion ~60-65% & ostial rPDA 60% --> In tandem could potentialy be RFR/FFR positive. However, in the absence of any ongoing active anginal symptoms, would not PCI in the preop setting. Early distal LAD eccentric 55 to 60% and apical 70% stenosed this cannot amenable for PCI. Otherwise diffuse mild to moderate disease in the LCx Normal LVEDP-normal EF by Echo RECOMMENDATIONS Aggressive to direct medical therapy for moderate diffuse CAD.  Monitor for symptoms.  But for now would simply treat medically. Would proceed with planned operation without further cardiac evaluation given nonobstructive CAD and normal echo. Glenetta Hew, MD   Cardiac Studies   Cath: 12/17/2022    Prox RCA-1 lesion is 50% stenosed. Prox RCA-2 lesion is 20% stenosed.   Mid RCA to Dist RCA lesion is 65% stenosed.   RPDA lesion is 60% stenosed.   RPAV lesion is 15% stenosed.  2nd RPL lesion is 30% stenosed.   Dist LAD-1 lesion is 55% stenosed. Dist/apical LAD-2 lesion is 75% stenosed.   Dist/apical LAD-2 lesion is 75% stenosed.   Prox 3rd Mrg is 40% stenosed   LV end diastolic pressure is normal.   POST CATH DIAGNOSES Mulitvessel diffuse Moderate CAD with no acute or obvious Flow-Limiting lesions. Prox RCA eccentril 50-55%, distal RCA eccentric calcified shelf lesion ~60-65% & ostial rPDA 60% --> In tandem could potentialy be RFR/FFR positive. However, in the absence of any ongoing active anginal symptoms, would not PCI in the preop setting. Early distal LAD eccentric 55 to 60% and apical 70% stenosed this cannot amenable for PCI. Otherwise diffuse mild to moderate disease in the LCx Normal LVEDP-normal EF by Echo      RECOMMENDATIONS Aggressive to direct medical  therapy for moderate diffuse CAD.  Monitor for symptoms.  But for now would simply treat medically. Would proceed with planned operation without further cardiac evaluation given nonobstructive CAD and normal  echo.   Glenetta Hew, MD  Diagnostic Dominance: Right   Echo: 12/12/2022  IMPRESSIONS     1. Limited Echo to evaluate LVEF   2. Left ventricular ejection fraction, by estimation, is 50 to 55%. The  left ventricle has low normal function. The left ventricle has no regional  wall motion abnormalities. Left ventricular diastolic function could not  be evaluated.   3. The mitral valve is grossly normal. Mild mitral valve regurgitation.  No evidence of mitral stenosis.   4. The inferior vena cava is normal in size with greater than 50%  respiratory variability, suggesting right atrial pressure of 3 mmHg.   Comparison(s): Changes from prior study are noted. LVEF improved from  35-40% in 07/2022.   FINDINGS   Left Ventricle: Left ventricular ejection fraction, by estimation, is 50  to 55%. The left ventricle has low normal function. The left ventricle has  no regional wall motion abnormalities. The left ventricular internal  cavity size was normal in size.  There is no left ventricular hypertrophy. Left ventricular diastolic  function could not be evaluated. Left ventricular diastolic function could  not be evaluated due to abnormal septal motion.   Left Atrium: Left atrial size was normal in size.   Right Atrium: Right atrial size was normal in size.   Pericardium: There is no evidence of pericardial effusion.   Mitral Valve: The mitral valve is grossly normal. Mild mitral valve  regurgitation. No evidence of mitral valve stenosis.   Tricuspid Valve: The tricuspid valve is not assessed. Tricuspid valve  regurgitation is not demonstrated. No evidence of tricuspid stenosis.   Aortic Valve: The aortic valve was not well visualized. There is mild  calcification of the  aortic valve.   Pulmonic Valve: The pulmonic valve was not assessed. Pulmonic valve  regurgitation is not visualized. No evidence of pulmonic stenosis.   Aorta: The aortic root is normal in size and structure.   Venous: The inferior vena cava is normal in size with greater than 50%  respiratory variability, suggesting right atrial pressure of 3 mmHg.   IAS/Shunts: No atrial level shunt detected by color flow Doppler.    Patient Profile     76 y.o. male with a history of coronary artery disease status post prior MI and drug-eluting stent placement to the LAD and first diagonal in November 2013, cardiomyopathy (EF 35 to 40% with global hypokinesis in August 2023), hypertension, hyperlipidemia, ? Of paroxysmal atrial fibrillation, first-degree AV block, Mobitz 1 heart block, type 2 diabetes mellitus, and end-stage renal disease now on hemodialysis, who presented 12/21 for elective cholecystectomy, however case cancelled in setting of newly abnl ECG w/ deep inferior and anterolateral TWI and prolonged QTC (582).    Assessment & Plan    Abnormal EKG/prolonged QTc/ Mobitz 1 Pre op evaluation -- presented for elective cholecystectomy and noted to be bradycardic with new inferior and anterolateral TWI with prolonged QTc in the setting of hypokalemia. EKG changes persist despite corrected electrolytes. Seen by cardiology team at [facility], Dr. Dellia Cloud with recs to transfer to [facility] for diagnostic cath prior to surgery. Underwent cardiac cath noted above with mild to moderate nonobstructive disease. No culprit for treatment.  -- continue ASA, switch to high dose statin    Cardiomyopathy HFrEF with improved EF -- Echo 07/2022 with LVEF of 35-40%, but now improved on limited echo 12/21 -- volume management per HD -- GDMT: limited in the setting of renal disease, continue hydralazine/nitrate. No BB in the setting  of bradycardia. Unable to add SLGT2i/MRA   ESRD on HD -- per nephrology   HTN --  stable -- continue hydralazine/nitrate   Acute on chronic anemia -- H/H dropped to 6.5 while at [facility], received PRBCs with improvement to 10.5 -- follow CBC   Questionable hx of PAF -- reports indicate possible dx back 07/2022 but notes indicate sinus rhythm throughout admission. Overall, diagnosis has been unclear. So far on review of telemetry, noted to have ongoing 1st degree AVB with intermittent mobitz 1. No definitive afib observed. -- BB was stopped at [facility] in the setting of bradycardia    HLD -- switch to high dose statin   For questions or updates, please contact [place] Please consult [email or web address] for contact info under        Signed, Reino Bellis, NP  12/18/2022, 9:25 AM    I have personally seen and examined this patient. I agree with the assessment and plan as outlined above.  Cardiac cath with non-obstructive CAD. OK to proceed with his planned surgery.  We will sign off. Please call with questions.   Lauree Chandler, MD, [facility] 12/18/2022 10:41 AM

## 2022-12-18 NOTE — Progress Notes (Signed)
Patient ID: [name], male   DOB: Mar 07, 1946, 76 y.o.   MRN: [id number] [facility] Progress Note:   1 Day Post-Op   THE PLAN  For HD today.  Plan lap chole tomorrow.  Made NPO p midnight  Subjective: Mental status is alert.  Complaints minimal. Objective: Vital signs in last 24 hours: Temp:  [98 F (36.7 C)-99 F (37.2 C)] 99 F (37.2 C) (12/27 0752) Pulse Rate:  [76-98] 93 (12/27 0752) Resp:  [10-25] 16 (12/27 0752) BP: (93-157)/(45-79) 106/59 (12/27 0752) SpO2:  [96 %-100 %] 96 % (12/27 0752)  Intake/Output from previous day: 12/26 0701 - 12/27 0700 In: -  Out: 300 [Urine:300] Intake/Output this shift: No intake/output data recorded.  Physical Exam: Work of breathing is not compromised.  Abdomen is nontender  Lab Results:  Results for orders placed or performed during the hospital encounter of 12/12/22 (from the past 48 hour(s))  Glucose, capillary     Status: None   Collection Time: 12/16/22 11:26 AM  Result Value Ref Range   Glucose-Capillary 85 70 - 99 mg/dL    Comment: Glucose reference range applies only to samples taken after fasting for at least 8 hours.  Glucose, capillary     Status: None   Collection Time: 12/16/22  4:45 PM  Result Value Ref Range   Glucose-Capillary 83 70 - 99 mg/dL    Comment: Glucose reference range applies only to samples taken after fasting for at least 8 hours.  Glucose, capillary     Status: None   Collection Time: 12/16/22  9:28 PM  Result Value Ref Range   Glucose-Capillary 94 70 - 99 mg/dL    Comment: Glucose reference range applies only to samples taken after fasting for at least 8 hours.  Surgical PCR screen     Status: None   Collection Time: 12/17/22  2:09 AM   Specimen: Nasal Mucosa; Nasal Swab  Result Value Ref Range   MRSA, PCR NEGATIVE NEGATIVE   Staphylococcus aureus NEGATIVE NEGATIVE    Comment: (NOTE) The Xpert SA Assay (FDA approved for NASAL specimens in patients 73 years of age and older), is  one component of a comprehensive surveillance program. It is not intended to diagnose infection nor to guide or monitor treatment. Performed at [facility], [place] [address], [place], [place] [address]   CBC with Differential/Platelet     Status: Abnormal   Collection Time: 12/17/22  4:04 AM  Result Value Ref Range   WBC 8.0 4.0 - 10.5 K/uL   RBC 3.29 (L) 4.22 - 5.81 MIL/uL   Hemoglobin 10.5 (L) 13.0 - 17.0 g/dL   HCT 33.7 (L) 39.0 - 52.0 %   MCV 102.4 (H) 80.0 - 100.0 fL   MCH 31.9 26.0 - 34.0 pg   MCHC 31.2 30.0 - 36.0 g/dL   RDW 19.4 (H) 11.5 - 15.5 %   Platelets 203 150 - 400 K/uL   nRBC 0.3 (H) 0.0 - 0.2 %   Neutrophils Relative % 60 %   Neutro Abs 4.7 1.7 - 7.7 K/uL   Lymphocytes Relative 19 %   Lymphs Abs 1.5 0.7 - 4.0 K/uL   Monocytes Relative 13 %   Monocytes Absolute 1.0 0.1 - 1.0 K/uL   Eosinophils Relative 7 %   Eosinophils Absolute 0.6 (H) 0.0 - 0.5 K/uL   Basophils Relative 1 %   Basophils Absolute 0.1 0.0 - 0.1 K/uL   Immature Granulocytes 0 %   Abs  Immature Granulocytes 0.03 0.00 - 0.07 K/uL    Comment: Performed at [facility], [place] [address] [place], [place] [address]  Magnesium     Status: None   Collection Time: 12/17/22  4:04 AM  Result Value Ref Range   Magnesium 2.0 1.7 - 2.4 mg/dL    Comment: Performed at [facility] [address], [place], [place] [address]  Glucose, capillary     Status: None   Collection Time: 12/17/22  2:08 PM  Result Value Ref Range   Glucose-Capillary 82 70 - 99 mg/dL    Comment: Glucose reference range applies only to samples taken after fasting for at least 8 hours.  CBC     Status: Abnormal   Collection Time: 12/17/22  6:50 PM  Result Value Ref Range   WBC 7.4 4.0 - 10.5 K/uL   RBC 2.73 (L) 4.22 - 5.81 MIL/uL   Hemoglobin 8.7 (L) 13.0 - 17.0 g/dL   HCT 28.4 (L) 39.0 - 52.0 %   MCV 104.0 (H) 80.0 - 100.0 fL   MCH 31.9 26.0 - 34.0 pg   MCHC 30.6 30.0 - 36.0 g/dL   RDW 19.3 (H) 11.5 - 15.5 %    Platelets 168 150 - 400 K/uL   nRBC 0.0 0.0 - 0.2 %    Comment: Performed at [facility] [address] [place], [place] [address]  Creatinine, serum     Status: Abnormal   Collection Time: 12/17/22  6:50 PM  Result Value Ref Range   Creatinine, Ser 3.38 (H) 0.61 - 1.24 mg/dL   GFR, Estimated 18 (L) >60 mL/min    Comment: (NOTE) Calculated using the CKD-EPI Creatinine Equation (2021) Performed at [facility] [address] [place], [place] [address]   Renal function panel     Status: Abnormal   Collection Time: 12/17/22  7:12 PM  Result Value Ref Range   Sodium 139 135 - 145 mmol/L   Potassium 4.9 3.5 - 5.1 mmol/L   Chloride 106 98 - 111 mmol/L   CO2 26 22 - 32 mmol/L   Glucose, Bld 104 (H) 70 - 99 mg/dL    Comment: Glucose reference range applies only to samples taken after fasting for at least 8 hours.   BUN 17 8 - 23 mg/dL   Creatinine, Ser 3.34 (H) 0.61 - 1.24 mg/dL   Calcium 9.2 8.9 - 10.3 mg/dL   Phosphorus 3.6 2.5 - 4.6 mg/dL   Albumin 2.1 (L) 3.5 - 5.0 g/dL   GFR, Estimated 18 (L) >60 mL/min    Comment: (NOTE) Calculated using the CKD-EPI Creatinine Equation (2021)    Anion gap 7 5 - 15    Comment: Performed at [facility] [address], [place], [place] [address]  Glucose, capillary     Status: Abnormal   Collection Time: 12/17/22  9:22 PM  Result Value Ref Range   Glucose-Capillary 123 (H) 70 - 99 mg/dL    Comment: Glucose reference range applies only to samples taken after fasting for at least 8 hours.  Renal function panel     Status: Abnormal   Collection Time: 12/18/22  4:31 AM  Result Value Ref Range   Sodium 139 135 - 145 mmol/L   Potassium 4.9 3.5 - 5.1 mmol/L   Chloride 106 98 - 111 mmol/L   CO2 28 22 - 32 mmol/L   Glucose, Bld 87 70 - 99 mg/dL    Comment:  Glucose reference range applies only to samples taken after fasting for at least 8 hours.   BUN 19 8 - 23 mg/dL   Creatinine, Ser 3.71 (H) 0.61 - 1.24 mg/dL   Calcium 9.2  8.9 - 10.3 mg/dL   Phosphorus 3.6 2.5 - 4.6 mg/dL   Albumin 1.9 (L) 3.5 - 5.0 g/dL   GFR, Estimated 16 (L) >60 mL/min    Comment: (NOTE) Calculated using the CKD-EPI Creatinine Equation (2021)    Anion gap 5 5 - 15    Comment: Performed at [facility] [address], [place], [place] [address]  CBC with Differential/Platelet     Status: Abnormal   Collection Time: 12/18/22  4:31 AM  Result Value Ref Range   WBC 9.1 4.0 - 10.5 K/uL   RBC 2.52 (L) 4.22 - 5.81 MIL/uL   Hemoglobin 8.1 (L) 13.0 - 17.0 g/dL   HCT 25.3 (L) 39.0 - 52.0 %   MCV 100.4 (H) 80.0 - 100.0 fL   MCH 32.1 26.0 - 34.0 pg   MCHC 32.0 30.0 - 36.0 g/dL   RDW 19.0 (H) 11.5 - 15.5 %   Platelets 206 150 - 400 K/uL   nRBC 0.0 0.0 - 0.2 %   Neutrophils Relative % 63 %   Neutro Abs 5.7 1.7 - 7.7 K/uL   Lymphocytes Relative 19 %   Lymphs Abs 1.7 0.7 - 4.0 K/uL   Monocytes Relative 11 %   Monocytes Absolute 1.0 0.1 - 1.0 K/uL   Eosinophils Relative 7 %   Eosinophils Absolute 0.6 (H) 0.0 - 0.5 K/uL   Basophils Relative 0 %   Basophils Absolute 0.0 0.0 - 0.1 K/uL   Immature Granulocytes 0 %   Abs Immature Granulocytes 0.04 0.00 - 0.07 K/uL    Comment: Performed at [facility], [place] [address], [place], [place] [address]  Magnesium     Status: None   Collection Time: 12/18/22  4:31 AM  Result Value Ref Range   Magnesium 1.9 1.7 - 2.4 mg/dL    Comment: Performed at [facility] [address], [place], [place] [address]  Glucose, capillary     Status: None   Collection Time: 12/18/22  7:57 AM  Result Value Ref Range   Glucose-Capillary 83 70 - 99 mg/dL    Comment: Glucose reference range applies only to samples taken after fasting for at least 8 hours.    Radiology/Results: CARDIAC CATHETERIZATION  Result Date: 12/17/2022   Prox RCA-1 lesion is 50% stenosed. Prox RCA-2 lesion is 20% stenosed.   Mid RCA to Dist RCA lesion is 65% stenosed.   RPDA lesion is 60% stenosed.   RPAV lesion is 15%  stenosed.  2nd RPL lesion is 30% stenosed.   Dist LAD-1 lesion is 55% stenosed. Dist/apical LAD-2 lesion is 75% stenosed.   Dist/apical LAD-2 lesion is 75% stenosed.   Prox 3rd Mrg is 40% stenosed   LV end diastolic pressure is normal. POST CATH DIAGNOSES Mulitvessel diffuse Moderate CAD with no acute or obvious Flow-Limiting lesions. Prox RCA eccentril 50-55%, distal RCA eccentric calcified shelf lesion ~60-65% & ostial rPDA 60% --> In tandem could potentialy be RFR/FFR positive. However, in the absence of any ongoing active anginal symptoms, would not PCI in the preop setting. Early distal LAD eccentric 55 to 60% and apical 70% stenosed this cannot amenable for PCI. Otherwise diffuse mild to moderate disease in the LCx Normal LVEDP-normal EF by Echo RECOMMENDATIONS Aggressive to  direct medical therapy for moderate diffuse CAD.  Monitor for symptoms.  But for now would simply treat medically. Would proceed with planned operation without further cardiac evaluation given nonobstructive CAD and normal echo. Glenetta Hew, MD   Anti-infectives: Anti-infectives (From admission, onward)    Start     Dose/Rate Route Frequency Ordered Stop   12/13/22 0600  cefoTEtan (CEFOTAN) 2 g in sodium chloride 0.9 % 100 mL IVPB  Status:  Discontinued        2 g 200 mL/hr over 30 Minutes Intravenous On call to O.R. 12/12/22 1631 12/14/22 0559   12/12/22 0615  cefoTEtan (CEFOTAN) 2 g in sodium chloride 0.9 % 100 mL IVPB  Status:  Discontinued        2 g 200 mL/hr over 30 Minutes Intravenous On call to O.R. 12/12/22 0614 12/12/22 0913       Assessment/Plan: Problem List: Patient Active Problem List   Diagnosis Date Noted   Preoperative cardiovascular examination 12/17/2022   Abnormal EKG 12/13/2022   Nausea and vomiting 12/12/2022   Pressure injury of skin of sacral region 12/12/2022   Prolonged QT interval 12/12/2022   Generalized weakness 12/12/2022   Pressure injury of sacral region, stage 1 09/24/2022    Hypotension    Sepsis ([place]) 09/13/2022   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   ESRD (end stage renal disease) ([place]) 08/07/2022   HFrEF (heart failure with reduced ejection fraction) (HCC)    (HFpEF) heart failure with preserved ejection fraction ([place]) 07/29/2022   Hypokalemia 07/29/2022   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   Acute respiratory failure with hypoxia ([place])    B12 deficiency 07/18/2022   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/2022   Diabetic neuropathy ([place]) 05/10/2022   Elevated troponin 05/10/2022   Type 2 diabetes mellitus with hyperglycemia ([place]) 05/10/2022   Other intervertebral disc degeneration, lumbar region 02/06/2022   Chronic gout, unspecified, 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ophi) 02/06/2022   Body mass index (BMI) 35.0-35.9, adult 02/06/2022   Obesity 02/06/2022   Primary generalized (osteo)arthritis 02/06/2022   Chest pain 02/06/2022   Lumbago with sciatica, right side 08/28/2021   Weakness of both lower extremities 08/28/2021   Body mass index (BMI) 33.0-33.9, adult 08/28/2021   Constipation 02/15/2021   Abdominal pain 01/02/2021   Diabetes mellitus ([place]) 11/09/2020   Hypertensive disorder 11/09/2020   Hypercholesterolemia 11/09/2020   H/O adenomatous polyp of colon 05/05/2020   Iron deficiency anemia 05/05/2020   Chronic kidney disease, stage IV (severe) ([place]) 12/14/2019   Acute gouty arthritis 10/25/2019   Leukocytosis 04/02/2019   Cellulitis of right hand 04/01/2019   Osteoarthritis of ankle and foot 02/08/2019   Heart failure with improved ejection fraction (HFimpEF) ([place]) 07/05/2015   Acute congestive heart failure ([place]) 07/04/2015   Acute on chronic diastolic congestive heart failure ([place]) 07/04/2015   Mixed hyperlipidemia 07/04/2015   Anxiety state 07/04/2015   GERD (gastroesophageal reflux disease) 07/04/2015   Heart block 07/04/2015   Gastroesophageal reflux disease 29/47/6546   Diastolic CHF, chronic (HCC) 03/11/2015   Chronic diastolic heart failure ([place])  03/11/2015   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   Knee stiffness 01/13/2014   Gout 12/11/2013   Expected blood loss anemia 11/17/2013   S/P right TKA 11/15/2013   Anemia, normocytic normochromic 01/12/2013   Acute kidney injury superimposed on chronic kidney disease ([place]) 12/10/2012   Diabetes mellitus type 2 in obese ([place]) 12/10/2012   Coronary artery disease involving native coronary artery of native heart without angina pectoris 11/24/2012   Overweight(278.02) 11/17/2012  Chronic kidney disease, stage 3, cardiorenal syndrome 08/23/2011   Chronic kidney disease, stage 3 unspecified ([place]) 08/23/2011   Essential hypertension     For OR tomorrow for lap chole.   1 Day Post-Op    LOS: 5 days   Matt B. Hassell Done, MD, [facility] [phone] to reach the surgeon on call.    12/18/2022 9:21 AM

## 2022-12-19 ENCOUNTER — Inpatient Hospital Stay (HOSPITAL_COMMUNITY): Payer: Medicare Other | Admitting: Anesthesiology

## 2022-12-19 ENCOUNTER — Encounter (HOSPITAL_COMMUNITY)
Admission: RE | Disposition: A | Discharge status: Skilled Nursing Facility | Payer: Self-pay | Source: Home / Self Care | Attending: Family Medicine

## 2022-12-19 ENCOUNTER — Encounter (HOSPITAL_COMMUNITY): Payer: Self-pay | Admitting: Internal Medicine

## 2022-12-19 ENCOUNTER — Other Ambulatory Visit: Payer: Self-pay

## 2022-12-19 DIAGNOSIS — Z87891 Personal history of nicotine dependence: Secondary | ICD-10-CM

## 2022-12-19 DIAGNOSIS — F418 Other specified anxiety disorders: Secondary | ICD-10-CM

## 2022-12-19 DIAGNOSIS — K8010 Calculus of gallbladder with chronic cholecystitis without obstruction: Secondary | ICD-10-CM | POA: Diagnosis not present

## 2022-12-19 DIAGNOSIS — K811 Chronic cholecystitis: Secondary | ICD-10-CM | POA: Diagnosis not present

## 2022-12-19 DIAGNOSIS — I4891 Unspecified atrial fibrillation: Secondary | ICD-10-CM

## 2022-12-19 HISTORY — PX: CHOLECYSTECTOMY: SHX55

## 2022-12-19 LAB — GLUCOSE, CAPILLARY
Glucose-Capillary: 76 mg/dL (ref 70–99)
Glucose-Capillary: 75 mg/dL (ref 70–99)
Glucose-Capillary: 92 mg/dL (ref 70–99)
Glucose-Capillary: 180 mg/dL — ABNORMAL HIGH (ref 70–99)
Glucose-Capillary: 138 mg/dL — ABNORMAL HIGH (ref 70–99)

## 2022-12-19 LAB — CBC WITH DIFFERENTIAL/PLATELET
Abs Immature Granulocytes: 0.01 10*3/uL (ref 0.00–0.07)
Basophils Absolute: 0 10*3/uL (ref 0.0–0.1)
Basophils Relative: 1 %
Eosinophils Absolute: 0.6 10*3/uL — ABNORMAL HIGH (ref 0.0–0.5)
Eosinophils Relative: 8 %
HCT: 26.6 % — ABNORMAL LOW (ref 39.0–52.0)
Hemoglobin: 8.2 g/dL — ABNORMAL LOW (ref 13.0–17.0)
Immature Granulocytes: 0 %
Lymphocytes Relative: 26 %
Lymphs Abs: 1.8 10*3/uL (ref 0.7–4.0)
MCH: 31.7 pg (ref 26.0–34.0)
MCHC: 30.8 g/dL (ref 30.0–36.0)
MCV: 102.7 fL — ABNORMAL HIGH (ref 80.0–100.0)
Monocytes Absolute: 0.7 10*3/uL (ref 0.1–1.0)
Monocytes Relative: 11 %
Neutro Abs: 3.8 10*3/uL (ref 1.7–7.7)
Neutrophils Relative %: 54 %
Platelets: 188 10*3/uL (ref 150–400)
RBC: 2.59 MIL/uL — ABNORMAL LOW (ref 4.22–5.81)
RDW: 18.6 % — ABNORMAL HIGH (ref 11.5–15.5)
WBC: 6.9 10*3/uL (ref 4.0–10.5)
nRBC: 0 % (ref 0.0–0.2)

## 2022-12-19 LAB — HEMOGLOBIN AND HEMATOCRIT, BLOOD
HCT: 25.7 % — ABNORMAL LOW (ref 39.0–52.0)
Hemoglobin: 8.4 g/dL — ABNORMAL LOW (ref 13.0–17.0)

## 2022-12-19 LAB — RENAL FUNCTION PANEL
Albumin: 2 g/dL — ABNORMAL LOW (ref 3.5–5.0)
Anion gap: 8 (ref 5–15)
BUN: 12 mg/dL (ref 8–23)
CO2: 29 mmol/L (ref 22–32)
Calcium: 8.9 mg/dL (ref 8.9–10.3)
Chloride: 98 mmol/L (ref 98–111)
Creatinine, Ser: 2.55 mg/dL — ABNORMAL HIGH (ref 0.61–1.24)
GFR, Estimated: 25 mL/min — ABNORMAL LOW (ref >60)
Glucose, Bld: 81 mg/dL (ref 70–99)
Phosphorus: 3.1 mg/dL (ref 2.5–4.6)
Potassium: 4.2 mmol/L (ref 3.5–5.1)
Sodium: 135 mmol/L (ref 135–145)

## 2022-12-19 LAB — LIPOPROTEIN A (LPA): Lipoprotein (a): 96.9 nmol/L — ABNORMAL HIGH (ref <75.0)

## 2022-12-19 LAB — MAGNESIUM: Magnesium: 1.6 mg/dL — ABNORMAL LOW (ref 1.7–2.4)

## 2022-12-19 SURGERY — LAPAROSCOPIC CHOLECYSTECTOMY
Anesthesia: General | Site: Abdomen

## 2022-12-19 MED ORDER — PHENYLEPHRINE HCL-NACL 20-0.9 MG/250ML-% IV SOLN
INTRAVENOUS | Status: DC | PRN
  Administered 2022-12-19: 50 ug/min via INTRAVENOUS

## 2022-12-19 MED ORDER — OXYCODONE HCL 5 MG PO TABS: 5.0000 mg | ORAL_TABLET | Freq: Once | ORAL | Status: AC | PRN

## 2022-12-19 MED ORDER — CEFAZOLIN SODIUM-DEXTROSE 2-3 GM-%(50ML) IV SOLR
INTRAVENOUS | Status: DC | PRN
  Administered 2022-12-19: 2 g via INTRAVENOUS

## 2022-12-19 MED ORDER — ONDANSETRON HCL 4 MG/2ML IJ SOLN
INTRAMUSCULAR | Status: DC | PRN
  Administered 2022-12-19: 4 mg via INTRAVENOUS

## 2022-12-19 MED ORDER — ONDANSETRON HCL 4 MG/2ML IJ SOLN
INTRAMUSCULAR | Status: AC
  Filled 2022-12-19: qty 2

## 2022-12-19 MED ORDER — SUGAMMADEX SODIUM 200 MG/2ML IV SOLN
INTRAVENOUS | Status: DC | PRN
  Administered 2022-12-19: 200 mg via INTRAVENOUS

## 2022-12-19 MED ORDER — DEXAMETHASONE SODIUM PHOSPHATE 10 MG/ML IJ SOLN
INTRAMUSCULAR | Status: AC
  Filled 2022-12-19: qty 1

## 2022-12-19 MED ORDER — LIDOCAINE 2% (20 MG/ML) 5 ML SYRINGE
INTRAMUSCULAR | Status: AC
  Filled 2022-12-19: qty 5

## 2022-12-19 MED ORDER — AMISULPRIDE (ANTIEMETIC) 5 MG/2ML IV SOLN: 10.0000 mg | Freq: Once | INTRAVENOUS | Status: DC | PRN

## 2022-12-19 MED ORDER — DARBEPOETIN ALFA 200 MCG/0.4ML IJ SOSY
200.0000 ug | PREFILLED_SYRINGE | INTRAMUSCULAR | Status: DC
  Administered 2022-12-20: 200 ug via SUBCUTANEOUS
  Filled 2022-12-19: qty 0.4

## 2022-12-19 MED ORDER — ACETAMINOPHEN 500 MG PO TABS
1000.0000 mg | ORAL_TABLET | Freq: Once | ORAL | Status: AC
  Administered 2022-12-19: 1000 mg via ORAL

## 2022-12-19 MED ORDER — FENTANYL CITRATE (PF) 250 MCG/5ML IJ SOLN
INTRAMUSCULAR | Status: AC
  Filled 2022-12-19: qty 5

## 2022-12-19 MED ORDER — LORAZEPAM 2 MG/ML IJ SOLN
0.2500 mg | INTRAMUSCULAR | Status: DC | PRN
  Administered 2022-12-19 – 2023-01-01 (×2): 0.25 mg via INTRAVENOUS
  Filled 2022-12-19 (×2): qty 1

## 2022-12-19 MED ORDER — OXYCODONE HCL 5 MG PO TABS
2.5000 mg | ORAL_TABLET | ORAL | Status: DC | PRN
  Administered 2022-12-19 – 2023-01-21 (×67): 5 mg via ORAL
  Filled 2022-12-19 (×69): qty 1

## 2022-12-19 MED ORDER — 0.9 % SODIUM CHLORIDE (POUR BTL) OPTIME
TOPICAL | Status: DC | PRN
  Administered 2022-12-19: 1000 mL

## 2022-12-19 MED ORDER — FENTANYL CITRATE (PF) 250 MCG/5ML IJ SOLN
INTRAMUSCULAR | Status: DC | PRN
  Administered 2022-12-19: 75 ug via INTRAVENOUS
  Administered 2022-12-19: 25 ug via INTRAVENOUS
  Administered 2022-12-19 (×3): 50 ug via INTRAVENOUS

## 2022-12-19 MED ORDER — PROPOFOL 10 MG/ML IV BOLUS
INTRAVENOUS | Status: DC | PRN
  Administered 2022-12-19: 120 mg via INTRAVENOUS

## 2022-12-19 MED ORDER — ROCURONIUM BROMIDE 10 MG/ML (PF) SYRINGE
PREFILLED_SYRINGE | INTRAVENOUS | Status: DC | PRN
  Administered 2022-12-19: 10 mg via INTRAVENOUS
  Administered 2022-12-19: 70 mg via INTRAVENOUS

## 2022-12-19 MED ORDER — OXYCODONE HCL 5 MG/5ML PO SOLN
ORAL | Status: AC
  Filled 2022-12-19: qty 5

## 2022-12-19 MED ORDER — ACETAMINOPHEN 500 MG PO TABS
ORAL_TABLET | ORAL | Status: AC
  Filled 2022-12-19: qty 2

## 2022-12-19 MED ORDER — FENTANYL CITRATE PF 50 MCG/ML IJ SOSY
12.5000 ug | PREFILLED_SYRINGE | INTRAMUSCULAR | Status: DC | PRN
  Administered 2022-12-19 – 2022-12-30 (×7): 25 ug via INTRAVENOUS
  Administered 2022-12-30: 12.5 ug via INTRAVENOUS
  Administered 2022-12-31 – 2023-01-12 (×9): 25 ug via INTRAVENOUS
  Filled 2022-12-19 (×18): qty 1

## 2022-12-19 MED ORDER — SODIUM CHLORIDE 0.9 % IR SOLN
Status: DC | PRN
  Administered 2022-12-19: 1000 mL

## 2022-12-19 MED ORDER — PHENYLEPHRINE 80 MCG/ML (10ML) SYRINGE FOR IV PUSH (FOR BLOOD PRESSURE SUPPORT)
PREFILLED_SYRINGE | INTRAVENOUS | Status: AC
  Filled 2022-12-19: qty 10

## 2022-12-19 MED ORDER — LIDOCAINE-EPINEPHRINE 1 %-1:100000 IJ SOLN
INTRAMUSCULAR | Status: DC | PRN
  Administered 2022-12-19: 10 mL

## 2022-12-19 MED ORDER — FENTANYL CITRATE (PF) 100 MCG/2ML IJ SOLN
INTRAMUSCULAR | Status: AC
  Filled 2022-12-19: qty 2

## 2022-12-19 MED ORDER — HYDRALAZINE HCL 10 MG PO TABS
10.0000 mg | ORAL_TABLET | Freq: Two times a day (BID) | ORAL | Status: DC
  Filled 2022-12-19: qty 1

## 2022-12-19 MED ORDER — INDOCYANINE GREEN 25 MG IV SOLR
INTRAVENOUS | Status: DC | PRN
  Administered 2022-12-19: 5 mg via INTRAVENOUS

## 2022-12-19 MED ORDER — BUPIVACAINE HCL (PF) 0.25 % IJ SOLN
INTRAMUSCULAR | Status: AC
  Filled 2022-12-19: qty 30

## 2022-12-19 MED ORDER — DEXAMETHASONE SODIUM PHOSPHATE 10 MG/ML IJ SOLN
INTRAMUSCULAR | Status: DC | PRN
  Administered 2022-12-19: 4 mg via INTRAVENOUS

## 2022-12-19 MED ORDER — LIDOCAINE-EPINEPHRINE 1 %-1:100000 IJ SOLN
INTRAMUSCULAR | Status: AC
  Filled 2022-12-19: qty 1

## 2022-12-19 MED ORDER — PHENYLEPHRINE 80 MCG/ML (10ML) SYRINGE FOR IV PUSH (FOR BLOOD PRESSURE SUPPORT)
PREFILLED_SYRINGE | INTRAVENOUS | Status: DC | PRN
  Administered 2022-12-19 (×5): 160 ug via INTRAVENOUS

## 2022-12-19 MED ORDER — ISOSORBIDE DINITRATE 10 MG PO TABS
5.0000 mg | ORAL_TABLET | Freq: Two times a day (BID) | ORAL | Status: DC
  Administered 2022-12-21 – 2023-01-21 (×59): 5 mg via ORAL
  Filled 2022-12-19 (×67): qty 1

## 2022-12-19 MED ORDER — LIDOCAINE 2% (20 MG/ML) 5 ML SYRINGE
INTRAMUSCULAR | Status: DC | PRN
  Administered 2022-12-19: 80 mg via INTRAVENOUS

## 2022-12-19 MED ORDER — ROCURONIUM BROMIDE 10 MG/ML (PF) SYRINGE
PREFILLED_SYRINGE | INTRAVENOUS | Status: AC
  Filled 2022-12-19: qty 10

## 2022-12-19 MED ORDER — ALBUMIN HUMAN 5 % IV SOLN: INTRAVENOUS | Status: DC | PRN

## 2022-12-19 MED ORDER — OXYCODONE HCL 5 MG/5ML PO SOLN
5.0000 mg | Freq: Once | ORAL | Status: AC | PRN
  Administered 2022-12-19: 5 mg via ORAL

## 2022-12-19 MED ORDER — FENTANYL CITRATE (PF) 100 MCG/2ML IJ SOLN
25.0000 ug | INTRAMUSCULAR | Status: DC | PRN
  Administered 2022-12-19 (×2): 25 ug via INTRAVENOUS
  Administered 2022-12-19: 50 ug via INTRAVENOUS

## 2022-12-19 MED ORDER — PROPOFOL 10 MG/ML IV BOLUS
INTRAVENOUS | Status: AC
  Filled 2022-12-19: qty 20

## 2022-12-19 SURGICAL SUPPLY — 51 items
ADH SKN CLS APL DERMABOND .7 (GAUZE/BANDAGES/DRESSINGS) ×1
APL PRP STRL LF DISP 70% ISPRP (MISCELLANEOUS) ×1
BAG COUNTER SPONGE SURGICOUNT (BAG) ×1 IMPLANT
BAG SPEC RTRVL 10 TROC 200 (ENDOMECHANICALS)
BAG SPNG CNTER NS LX DISP (BAG) ×1
CANISTER SUCT 3000ML PPV (MISCELLANEOUS) ×2 IMPLANT
CHLORAPREP W/TINT 26 (MISCELLANEOUS) ×1 IMPLANT
CLIP LIGATING HEMO O LOK GREEN (MISCELLANEOUS) ×2 IMPLANT
COVER SURGICAL LIGHT HANDLE (MISCELLANEOUS) ×2 IMPLANT
COVER TRANSDUCER ULTRASND (DRAPES) ×1 IMPLANT
DERMABOND ADVANCED .7 DNX12 (GAUZE/BANDAGES/DRESSINGS) ×1 IMPLANT
ELECT REM PT RETURN 9FT ADLT (ELECTROSURGICAL) ×1
ELECTRODE REM PT RTRN 9FT ADLT (ELECTROSURGICAL) ×2 IMPLANT
ENDOLOOP SUT PDS II  0 18 (SUTURE) ×1
ENDOLOOP SUT PDS II 0 18 (SUTURE) IMPLANT
GLOVE BIO SURGEON STRL SZ7.5 (GLOVE) ×2 IMPLANT
GLOVE SURG SYN 7.5  E (GLOVE) ×1
GLOVE SURG SYN 7.5 E (GLOVE) ×1 IMPLANT
GLOVE SURG SYN 7.5 PF PI (GLOVE) ×1 IMPLANT
GOWN STRL REUS W/ TWL LRG LVL3 (GOWN DISPOSABLE) ×4 IMPLANT
GOWN STRL REUS W/ TWL XL LVL3 (GOWN DISPOSABLE) ×1 IMPLANT
GOWN STRL REUS W/TWL LRG LVL3 (GOWN DISPOSABLE) ×2
GOWN STRL REUS W/TWL XL LVL3 (GOWN DISPOSABLE)
GRASPER SUT TROCAR 14GX15 (MISCELLANEOUS) ×2 IMPLANT
KIT BASIN OR (CUSTOM PROCEDURE TRAY) ×2 IMPLANT
KIT TURNOVER KIT B (KITS) ×2 IMPLANT
NDL INSUFFLATION 14GA 120MM (NEEDLE) ×1 IMPLANT
NEEDLE INSUFFLATION 14GA 120MM (NEEDLE) IMPLANT
NS IRRIG 1000ML POUR BTL (IV SOLUTION) ×2 IMPLANT
PAD ARMBOARD 7.5X6 YLW CONV (MISCELLANEOUS) ×2 IMPLANT
PENCIL BUTTON HOLSTER BLD 10FT (ELECTRODE) IMPLANT
POUCH LAPAROSCOPIC INSTRUMENT (MISCELLANEOUS) ×2 IMPLANT
POUCH RETRIEVAL ECOSAC 10 (ENDOMECHANICALS) IMPLANT
POUCH RETRIEVAL ECOSAC 10MM (ENDOMECHANICALS)
SCISSORS LAP 5X35 DISP (ENDOMECHANICALS) ×2 IMPLANT
SET IRRIG TUBING LAPAROSCOPIC (IRRIGATION / IRRIGATOR) ×2 IMPLANT
SET TUBE SMOKE EVAC HIGH FLOW (TUBING) ×2 IMPLANT
SLEEVE ENDOPATH XCEL 5M (ENDOMECHANICALS) ×1 IMPLANT
SLEEVE Z-THREAD 5X100MM (TROCAR) IMPLANT
SPECIMEN JAR SMALL (MISCELLANEOUS) ×2 IMPLANT
SUT MNCRL AB 4-0 PS2 18 (SUTURE) ×2 IMPLANT
SUT VICRYL 0 UR6 27IN ABS (SUTURE) IMPLANT
TOWEL GREEN STERILE (TOWEL DISPOSABLE) ×2 IMPLANT
TOWEL GREEN STERILE FF (TOWEL DISPOSABLE) ×2 IMPLANT
TRAY LAPAROSCOPIC MC (CUSTOM PROCEDURE TRAY) ×2 IMPLANT
TROCAR 11X100 Z THREAD (TROCAR) IMPLANT
TROCAR BALLN 12MMX100 BLUNT (TROCAR) IMPLANT
TROCAR XCEL NON-BLD 11X100MML (ENDOMECHANICALS) ×2 IMPLANT
TROCAR Z-THREAD OPTICAL 5X100M (TROCAR) ×2 IMPLANT
WARMER LAPAROSCOPE (MISCELLANEOUS) ×1 IMPLANT
WATER STERILE IRR 1000ML POUR (IV SOLUTION) ×1 IMPLANT

## 2022-12-19 NOTE — Progress Notes (Signed)
[facility] Progress Note   Subjective: Seen in room S/P laparoscopic cholecystectomy  today per Dr. Barry Dienes. Lethargic but asking for potato soup. Says his "stomach is sore". HD tomorrow on schedule.   Objective Vitals:   12/19/22 1115 12/19/22 1130 12/19/22 1132 12/19/22 1147  BP: 120/72 134/73 137/60 136/69  Pulse: 82 83 82 84  Resp: 19 18 20 18   Temp:  97.8 F (36.6 C)  97.8 F (36.6 C)  TempSrc:    Oral  SpO2: 100% 96% 97% 100%  Weight:      Height:       Physical Exam General: Pleasant elderly male in NAD Neuro: Oriented to person & place. Follows commands.  Heart: S1,S2 RRR No M/R/G SR 1st degree AVB on monitor.  Lungs:CTAB Abdomen: Soft, hypoactive BS Extremities: Woody appearance BLE No LE edema Dialysis Access: RIJ TDC drsg CDI   Additional Objective Labs: Basic Metabolic Panel: Recent Labs  Lab 12/17/22 1912 12/18/22 0431 12/19/22 0437  NA 139 139 135  K 4.9 4.9 4.2  CL 106 106 98  CO2 26 28 29   GLUCOSE 104* 87 81  BUN 17 19 12   CREATININE 3.34* 3.71* 2.55*  CALCIUM 9.2 9.2 8.9  PHOS 3.6 3.6 3.1   Liver Function Tests: Recent Labs  Lab 12/17/22 1912 12/18/22 0431 12/19/22 0437  ALBUMIN 2.1* 1.9* 2.0*   No results for input(s): "LIPASE", "AMYLASE" in the last 168 hours. CBC: Recent Labs  Lab 12/16/22 0445 12/17/22 0404 12/17/22 1850 12/18/22 0431 12/19/22 0437  WBC 8.0 8.0 7.4 9.1 6.9  NEUTROABS 4.3 4.7  --  5.7 3.8  HGB 7.9* 10.5* 8.7* 8.1* 8.2*  HCT 25.1* 33.7* 28.4* 25.3* 26.6*  MCV 102.4* 102.4* 104.0* 100.4* 102.7*  PLT 188 203 168 206 188   Blood Culture    Component Value Date/Time   SDES  09/15/2022 0743    BLOOD LEFT HAND BOTTLES DRAWN AEROBIC AND ANAEROBIC   SPECREQUEST  09/15/2022 0743    Blood Culture results may not be optimal due to an excessive volume of blood received in culture bottles   CULT  09/15/2022 0743    NO GROWTH 5 DAYS Performed at [facility], [address], [place], [place]  [address]    REPTSTATUS 09/20/2022 FINAL 09/15/2022 0743    Cardiac Enzymes: No results for input(s): "CKTOTAL", "CKMB", "CKMBINDEX", "TROPONINI" in the last 168 hours. CBG: Recent Labs  Lab 12/18/22 1822 12/18/22 2143 12/19/22 0716 12/19/22 1039 12/19/22 1123  GLUCAP 87 104* 76 75 92   Iron Studies: No results for input(s): "IRON", "TIBC", "TRANSFERRIN", "FERRITIN" in the last 72 hours. @lablastinr3 @ Studies/Results: No results found. Medications:  sodium chloride      allopurinol  100 mg Oral Daily   aspirin EC  81 mg Oral Daily   atorvastatin  40 mg Oral Daily   Chlorhexidine Gluconate Cloth  6 each Topical Q0600   cycloSPORINE  1 drop Both Eyes BID   docusate sodium  100 mg Oral BID   gabapentin  300 mg Oral QID   heparin  5,000 Units Subcutaneous Q8H   hydrALAZINE  25 mg Oral BID   isosorbide dinitrate  10 mg Oral BID   leptospermum manuka honey  1 Application Topical Daily   melatonin  4.5 mg Oral QHS   mupirocin ointment  1 Application Nasal BID   polyethylene glycol  17 g Oral Daily   sodium chloride flush  3 mL Intravenous Q12H   sodium chloride flush  3 mL Intravenous Q12H     OP HD: [facility] MWF 4h  400/800  84.5kg  2/2.5 bath   Heparin 4000 units IV  RIJ TDC - last OP HD 12/20, post wt 85.9kg - last IP hD 12/24, post wt 85kg - mircera 225 mcg IV q2, last 12/18, due 1/02 - no vdra     Assessment/ Plan: Chronic cholecystitis - w/ nausea, malnutrition, FTT.  SP cardiac cath 12/26.  Chronic HFrEF - LVEF up to 50-55% here, last was 35-40%. LHC done 12/17/2022. Moderate diffuse CAD. No intervention needed. Treat medically.  ESRD - on HD MWF. Next HD 12/20/2022 HTN/ volume -BP stable. No evidence of excess volume by exam. UF as tolerated. Anemia esrd - Hb 8.2 down from 10.5 12/17/2022. Will redose ESA in HD 12/20/2022. Check iron panel.  MBD ckd - CCa and phos in range.  DM2 - per pmd  [name] Franchini NP-C 12/19/2022, 2:11 PM  [employer] [phone]

## 2022-12-19 NOTE — Progress Notes (Signed)
Initial Nutrition Assessment  DOCUMENTATION CODES:   Not applicable  INTERVENTION:  - Advance diet as tolerated.   - Add supplements once diet is advanced.   NUTRITION DIAGNOSIS:   Inadequate oral intake related to inability to eat as evidenced by NPO status.  GOAL:   Patient will meet greater than or equal to 90% of their needs  MONITOR:   PO intake, Diet advancement  REASON FOR ASSESSMENT:   Malnutrition Screening Tool    ASSESSMENT:   76 y.o. male admits related to elective cholecystectomy. PMH reviewed: CKD, T2DM, GERD, Gout, HTN, Vertigo. Pt is currently receiving medical management for chronic cholecystitis.  Meds reviewed:  lipitor, colace, miralax. Labs reviewed: Mg low.   Pt is currently NPO and out of room for in OR for cholecystectomy. Per record, pt had been eating mostly 75-100% of his meals prior to NPO status. Per record, pt has experienced a 30% wt loss in 5 months, which is significant. RD will attempt to gather more nutrition hx details at f/u.   NUTRITION - FOCUSED PHYSICAL EXAM:  Unable to assess, attempt at f/u.   Diet Order:   Diet Order             Diet NPO time specified  Diet effective midnight                   EDUCATION NEEDS:   Not appropriate for education at this time  Skin:  Skin Assessment: Skin Integrity Issues: Skin Integrity Issues:: Unstageable Unstageable: mid sacrum  Last BM:  12/22  Height:   Ht Readings from Last 1 Encounters:  12/17/22 6' 3"  (1.905 m)    Weight:   Wt Readings from Last 1 Encounters:  12/18/22 85.7 kg    Ideal Body Weight:     BMI:  Body mass index is 23.62 kg/m.  Estimated Nutritional Needs:   Kcal:  0722-5750 kcals  Protein:  105-130 gm  Fluid:  >/= 2.1 L  Thalia Bloodgood, RD, LDN, CNSC.

## 2022-12-19 NOTE — Progress Notes (Signed)
OT Cancellation Note  Patient Details Name: [name] MRN: [id number] DOB: [birth year]   Cancelled Treatment:    Reason Eval/Treat Not Completed: Patient at procedure or test/ unavailable. Patient just received meal after bein NPO. Will f/u as able.  Kamylle Axelson L Irena Gaydos 12/19/2022, 3:54 PM

## 2022-12-19 NOTE — Interval H&P Note (Signed)
History and Physical Interval Note:  12/19/2022 8:05 AM  [name]  has presented today for surgery, with the diagnosis of Cholecystitis.  The various methods of treatment have been discussed with the patient and family. After consideration of risks, benefits and other options for treatment, the patient has consented to  Procedure(s): LAPAROSCOPIC CHOLECYSTECTOMY (N/A) as a surgical intervention.  The patient's history has been reviewed, patient examined, no change in status, stable for surgery.  I have reviewed the patient's chart and labs.  Questions were answered to the patient's satisfaction.     Stark Klein

## 2022-12-19 NOTE — Discharge Instructions (Signed)
[facility]  Please arrive at least 30 min before your appointment to complete your check in paperwork.  If you are unable to arrive 30 min prior to your appointment time we may have to cancel or reschedule you. LAPAROSCOPIC SURGERY: POST OP INSTRUCTIONS Always review your discharge instruction sheet given to you by the facility where your surgery was performed. IF YOU HAVE DISABILITY OR FAMILY LEAVE FORMS, YOU MUST BRING THEM TO THE OFFICE FOR PROCESSING.   DO NOT GIVE THEM TO YOUR DOCTOR.  PAIN CONTROL  First take acetaminophen (Tylenol) AND/or ibuprofen (Advil) to control your pain after surgery.  Follow directions on package.  Taking acetaminophen (Tylenol) and/or ibuprofen (Advil) regularly after surgery will help to control your pain and lower the amount of prescription pain medication you may need.  You should not take more than 4,000 mg (4 grams) of acetaminophen (Tylenol) in 24 hours.  You should not take ibuprofen (Advil), aleve, motrin, naprosyn or other NSAIDS if you have a history of stomach ulcers or chronic kidney disease.  A prescription for pain medication may be given to you upon discharge.  Take your pain medication as prescribed, if you still have uncontrolled pain after taking acetaminophen (Tylenol) or ibuprofen (Advil). Use ice packs to help control pain. If you need a refill on your pain medication, please contact your pharmacy.  They will contact our office to request authorization. Prescriptions will not be filled after 5pm or on week-ends.  HOME MEDICATIONS Take your usually prescribed medications unless otherwise directed.  DIET You should follow a light diet the first few days after arrival home.  Be sure to include lots of fluids daily. Avoid fatty, fried foods.   CONSTIPATION It is common to experience some constipation after surgery and if you are taking pain medication.  Increasing fluid intake and taking a stool softener (such as Colace)  will usually help or prevent this problem from occurring.  A mild laxative (Milk of Magnesia or Miralax) should be taken according to package instructions if there are no bowel movements after 48 hours.  WOUND/INCISION CARE Most patients will experience some swelling and bruising in the area of the incisions.  Ice packs will help.  Swelling and bruising can take several days to resolve.  Unless discharge instructions indicate otherwise, follow guidelines below  STERI-STRIPS - you may remove your outer bandages 48 hours after surgery, and you may shower at that time.  You have steri-strips (small skin tapes) in place directly over the incision.  These strips should be left on the skin for 7-10 days.   DERMABOND/SKIN GLUE - you may shower in 24 hours.  The glue will flake off over the next 2-3 weeks. Any sutures or staples will be removed at the office during your follow-up visit.  ACTIVITIES You may resume regular (light) daily activities beginning the next day--such as daily self-care, walking, climbing stairs--gradually increasing activities as tolerated.  You may have sexual intercourse when it is comfortable.  Refrain from any heavy lifting or straining until approved by your doctor. You may drive when you are no longer taking prescription pain medication, you can comfortably wear a seatbelt, and you can safely maneuver your car and apply brakes.  FOLLOW-UP You should see your doctor in the office for a follow-up appointment approximately 2-3 weeks after your surgery.  You should have been given your post-op/follow-up appointment when your surgery was scheduled.  If you did not receive a post-op/follow-up appointment, make sure   that you call for this appointment within a day or two after you arrive home to insure a convenient appointment time.  OTHER INSTRUCTIONS  WHEN TO CALL YOUR DOCTOR: Fever over 101.0 Inability to urinate Continued bleeding from incision. Increased pain, redness, or  drainage from the incision. Increasing abdominal pain  The clinic staff is available to answer your questions during regular business hours.  Please don't hesitate to call and ask to speak to one of the nurses for clinical concerns.  If you have a medical emergency, go to the nearest emergency room or call 911.  A surgeon from [facility] is always on call at the hospital. [address], [address], [place], [place]  [address] ? [address] [address], [place],    [address] [phone] ? [phone] ? FAX [phone]   

## 2022-12-19 NOTE — Progress Notes (Signed)
Pt receives out-pt HD at [facility] on MWF with 11:55 chair time. Will provide info to CSW for snf placement purposes. Will assist as needed.   Melven Sartorius Renal Navigator [phone]

## 2022-12-19 NOTE — Progress Notes (Signed)
Met with pt re current SNF offers. Pt sleeping at time of visit and requested SW return tomorrow. List of current offers ([facility], [facility]) left on table for pt to review.   Wandra Feinstein, MSW, LCSW [phone] (coverage)

## 2022-12-19 NOTE — Anesthesia Postprocedure Evaluation (Signed)
Anesthesia Post Note  Patient: [name]  Procedure(s) Performed: LAPAROSCOPIC CHOLECYSTECTOMY (Abdomen)     Patient location during evaluation: PACU Anesthesia Type: General Level of consciousness: awake and alert Pain management: pain level controlled Vital Signs Assessment: post-procedure vital signs reviewed and stable Respiratory status: spontaneous breathing, nonlabored ventilation and respiratory function stable Cardiovascular status: blood pressure returned to baseline and stable Postop Assessment: no apparent nausea or vomiting Anesthetic complications: no   No notable events documented.  Last Vitals:  Vitals:   12/19/22 1132 12/19/22 1147  BP: 137/60 136/69  Pulse: 82 84  Resp: 20 18  Temp:  36.6 C  SpO2: 97% 100%    Last Pain:  Vitals:   12/19/22 1147  TempSrc: Oral  PainSc: [place]

## 2022-12-19 NOTE — Transfer of Care (Signed)
Immediate Anesthesia Transfer of Care Note  Patient: [name]  Procedure(s) Performed: LAPAROSCOPIC CHOLECYSTECTOMY (Abdomen)  Patient Location: PACU  Anesthesia Type:General  Level of Consciousness: drowsy, patient cooperative, and responds to stimulation  Airway & Oxygen Therapy: Patient Spontanous Breathing  Post-op Assessment: Report given to RN and Post -op Vital signs reviewed and stable  Post vital signs: Reviewed and stable  Last Vitals:  Vitals Value Taken Time  BP 131/50 12/19/22 1037  Temp    Pulse 86 12/19/22 1040  Resp 20 12/19/22 1040  SpO2 96 % 12/19/22 1040  Vitals shown include unvalidated device data.  Last Pain:  Vitals:   12/19/22 0512  TempSrc: Oral  PainSc: 0-No pain         Complications: No notable events documented.

## 2022-12-19 NOTE — Anesthesia Procedure Notes (Signed)
Procedure Name: Intubation Date/Time: 12/19/2022 8:39 AM  Performed by: Janace Litten, CRNAPre-anesthesia Checklist: Patient identified, Emergency Drugs available, Suction available and Patient being monitored Patient Re-evaluated:Patient Re-evaluated prior to induction Oxygen Delivery Method: Circle System Utilized Preoxygenation: Pre-oxygenation with 100% oxygen Induction Type: IV induction Laryngoscope Size: Mac and 4 Grade View: Grade II Tube type: Oral Tube size: 7.0 mm Number of attempts: 1 Airway Equipment and Method: Stylet Placement Confirmation: ETT inserted through vocal cords under direct vision, positive ETCO2 and breath sounds checked- equal and bilateral Secured at: 23 cm Tube secured with: Tape Dental Injury: Teeth and Oropharynx as per pre-operative assessment

## 2022-12-19 NOTE — Progress Notes (Signed)
PROGRESS NOTE    [name]  PJK:[id number] DOB: 1946-11-30 DOA: 12/12/2022 PCP: Redmond School, MD  No chief complaint on file.   Brief Narrative:  [name] is [name] 76 y.o. male with PMH ESRD on MWF HD, HFrEF, T2DM, HTN who presented with nausea. He has [name] known history of cholecystitis and had undergone cholecystostomy tube placement.  His tube had become dislodged and he underwent evaluation with surgery.  He was noted to have recanalization of his bile duct and therefore the tube was not replaced. He presented to the hospital for elective cholecystectomy and underwent preop evaluation with cardiology.  His workup was notable for diffuse T wave inversions and prolonged QTc.  He was also noted to be significantly hypokalemic.  He was started on electrolyte replacement and admitted for further workup and monitoring. Nephrology was also consulted to continue his chronic HD during hospitalization. Despite improved metabolic parameters, ECG changes persisted, so he was transferred to [facility] for cardiac catheterization on 12/26.   Assessment & Plan:   Principal Problem:   Chronic cholecystitis Active Problems:   ESRD (end stage renal disease) ([place])   Coronary artery disease involving native coronary artery of native heart without angina pectoris   Hypokalemia   Diabetes mellitus type 2 in obese (HCC)   Anemia, normocytic normochromic   Essential hypertension   Heart failure with improved ejection fraction (HFimpEF) (HCC)   HFrEF (heart failure with reduced ejection fraction) (HCC)   Nausea and vomiting   Pressure injury of skin of sacral region   Prolonged QT interval   Generalized weakness   Abnormal EKG   Preoperative cardiovascular examination  Chronic cholecystitis: Causing failure to thrive, malnutrition.  -now s/p cholecystectomy 12/28 with intraoperative ICG cholangiography   Chronic HFrEF, abnormal ECG: Repeat echo this admission with LVEF up to 50-55% from 35-40% in  August 2023.  - Manage volume with HD - Cotninue cardiac monitoring - LHC 12/26 with multivessel diffuse moderate CAD with no acute or obvious flow limiting lesions (in absence of any active anginal symptoms, would not PCI in preop setting)  - recommending medical therapy, monitor for symptoms, ok to proceed with operation - high dose statin - Cardiology following patient   ESRD: Last HD off schedule early 12/24. Typically MWF.  - Nephrology consulted for HD.  - Renal function panel ordered today.    Anemia of ESRD:  - ESA per nephrology - appears to have received 1 unit pRBC during this hospitalization    Hypokalemia: Resolved.    T2DM:  A1c 4.4 (likely reflecting his recent failure to thrive, poor PO intake) D/c SSI   Weakness: Has been in and out of SNFs, currently SNF recommended. Pt has bed offers. Needs updated PT/OT evaluations for insurance authorization.  Follow therapy evaluations   Unstageable sacral pressure injury, POA: Uninfected appearing.  - Offload, local wound care     DVT prophylaxis: heparin Code Status: full Family Communication: brother Magdaleno Lortie), called both nieces, but no answer Disposition:   Status is: Inpatient Remains inpatient appropriate because: continued   Consultants:  Renal Surgery cardiology  Procedures:  12/28  Laparoscopic Cholecystectomy with intraoperative ICG cholangiography      LHC   Prox RCA-1 lesion is 50% stenosed. Prox RCA-2 lesion is 20% stenosed.   Mid RCA to Dist RCA lesion is 65% stenosed.   RPDA lesion is 60% stenosed.   RPAV lesion is 15% stenosed.  2nd RPL lesion is 30% stenosed.   Dist LAD-1  lesion is 55% stenosed. Dist/apical LAD-2 lesion is 75% stenosed.   Dist/apical LAD-2 lesion is 75% stenosed.   Prox 3rd Mrg is 40% stenosed   LV end diastolic pressure is normal.   POST CATH DIAGNOSES Mulitvessel diffuse Moderate CAD with no acute or obvious Flow-Limiting lesions. Prox RCA eccentril 50-55%,  distal RCA eccentric calcified shelf lesion ~60-65% & ostial rPDA 60% --> In tandem could potentialy be RFR/FFR positive. However, in the absence of any ongoing active anginal symptoms, would not PCI in the preop setting. Early distal LAD eccentric 55 to 60% and apical 70% stenosed this cannot amenable for PCI. Otherwise diffuse mild to moderate disease in the LCx Normal LVEDP-normal EF by Echo      RECOMMENDATIONS Aggressive to direct medical therapy for moderate diffuse CAD.  Monitor for symptoms.  But for now would simply treat medically. Would proceed with planned operation without further cardiac evaluation given nonobstructive CAD and normal echo.   Echo IMPRESSIONS     1. Limited Echo to evaluate LVEF   2. Left ventricular ejection fraction, by estimation, is 50 to 55%. The  left ventricle has low normal function. The left ventricle has no regional  wall motion abnormalities. Left ventricular diastolic function could not  be evaluated.   3. The mitral valve is grossly normal. Mild mitral valve regurgitation.  No evidence of mitral stenosis.   4. The inferior vena cava is normal in size with greater than 50%  respiratory variability, suggesting right atrial pressure of 3 mmHg.   Comparison(s): Changes from prior study are noted. LVEF improved from  35-40% in 07/2022.   Antimicrobials:  Anti-infectives (From admission, onward)    Start     Dose/Rate Route Frequency Ordered Stop   12/13/22 0600  cefoTEtan (CEFOTAN) 2 g in sodium chloride 0.9 % 100 mL IVPB  Status:  Discontinued        2 g 200 mL/hr over 30 Minutes Intravenous On call to O.R. 12/12/22 1631 12/14/22 0559   12/12/22 0615  cefoTEtan (CEFOTAN) 2 g in sodium chloride 0.9 % 100 mL IVPB  Status:  Discontinued        2 g 200 mL/hr over 30 Minutes Intravenous On call to O.R. 12/12/22 0614 12/12/22 0913       Subjective: C/o nausea and post op pain  Objective: Vitals:   12/19/22 1130 12/19/22 1132 12/19/22  1147 12/19/22 1625  BP: 134/73 137/60 136/69 93/62  Pulse: 83 82 84 62  Resp: 18 20 18 16   Temp: 97.8 F (36.6 C)  97.8 F (36.6 C) 97.8 F (36.6 C)  TempSrc:   Oral Oral  SpO2: 96% 97% 100% 100%  Weight:      Height:        Intake/Output Summary (Last 24 hours) at 12/19/2022 1643 Last data filed at 12/19/2022 1028 Gross per 24 hour  Intake 750 ml  Output 1520 ml  Net -770 ml   Filed Weights   12/17/22 0152 12/18/22 1710  Weight: 85 kg 85.7 kg    Examination:  General: No acute distress. Cardiovascular: RRR Lungs:unlabored Abdomen: laparoscopic incisions, appropriately tender Neurological: Alert and oriented 3. Moves all extremities 4 with equal strength. Cranial nerves II through XII grossly intact. Extremities: No clubbing or cyanosis. No edema.    Data Reviewed: I have personally reviewed following labs and imaging studies  CBC: Recent Labs  Lab 12/16/22 0445 12/17/22 0404 12/17/22 1850 12/18/22 0431 12/19/22 0437  WBC 8.0 8.0 7.4 9.1 6.9  NEUTROABS 4.3 4.7  --  5.7 3.8  HGB 7.9* 10.5* 8.7* 8.1* 8.2*  HCT 25.1* 33.7* 28.4* 25.3* 26.6*  MCV 102.4* 102.4* 104.0* 100.4* 102.7*  PLT 188 203 168 206 206    Basic Metabolic Panel: Recent Labs  Lab 12/14/22 0522 12/15/22 1100 12/16/22 0445 12/17/22 0404 12/17/22 1850 12/17/22 1912 12/18/22 0431 12/19/22 0437  NA 139 139 141  --   --  139 139 135  K 3.8 3.7 4.2  --   --  4.9 4.9 4.2  CL 102 103 105  --   --  106 106 98  CO2 29 30 29   --   --  26 28 29   GLUCOSE 82 112* 87  --   --  104* 87 81  BUN 9 14 9   --   --  17 19 12   CREATININE 2.51* 3.35* 2.21*  --  3.38* 3.34* 3.71* 2.55*  CALCIUM 8.7* 8.9 8.8*  --   --  9.2 9.2 8.9  MG 1.9  --  1.9 2.0  --   --  1.9 1.6*  PHOS  --  3.6 2.1*  --   --  3.6 3.6 3.1    GFR: Estimated Creatinine Clearance: 29 mL/min (Nori Winegar) (by C-G formula based on SCr of 2.55 mg/dL (H)).  Liver Function Tests: Recent Labs  Lab 12/15/22 1100 12/16/22 0445 12/17/22 1912  12/18/22 0431 12/19/22 0437  ALBUMIN 2.1* 2.1* 2.1* 1.9* 2.0*    CBG: Recent Labs  Lab 12/18/22 1822 12/18/22 2143 12/19/22 0716 12/19/22 1039 12/19/22 1123  GLUCAP 87 104* 76 75 92     Recent Results (from the past 240 hour(s))  Surgical pcr screen     Status: None   Collection Time: 12/10/22 12:17 PM   Specimen: Nasal Mucosa; Nasal Swab  Result Value Ref Range Status   MRSA, PCR NEGATIVE NEGATIVE Final   Staphylococcus aureus NEGATIVE NEGATIVE Final    Comment: (NOTE) The Xpert SA Assay (FDA approved for NASAL specimens in patients 30 years of age and older), is one component of Christne Platts comprehensive surveillance program. It is not intended to diagnose infection nor to guide or monitor treatment. Performed at [facility], [address] [place], [place] [address]   Surgical PCR screen     Status: None   Collection Time: 12/17/22  2:09 AM   Specimen: Nasal Mucosa; Nasal Swab  Result Value Ref Range Status   MRSA, PCR NEGATIVE NEGATIVE Final   Staphylococcus aureus NEGATIVE NEGATIVE Final    Comment: (NOTE) The Xpert SA Assay (FDA approved for NASAL specimens in patients 72 years of age and older), is one component of Shakeela Rabadan comprehensive surveillance program. It is not intended to diagnose infection nor to guide or monitor treatment. Performed at [facility], [place] [address], [place], [place] [address]          Radiology Studies: No results found.      Scheduled Meds:  allopurinol  100 mg Oral Daily   aspirin EC  81 mg Oral Daily   atorvastatin  40 mg Oral Daily   Chlorhexidine Gluconate Cloth  6 each Topical Q0600   cycloSPORINE  1 drop Both Eyes BID   [START ON 12/20/2022] darbepoetin (ARANESP) injection - DIALYSIS  200 mcg Subcutaneous Q Fri-1800   docusate sodium  100 mg Oral BID   gabapentin  300 mg Oral QID   heparin  5,000 Units Subcutaneous Q8H   hydrALAZINE  25 mg Oral BID  isosorbide dinitrate  10 mg Oral BID   leptospermum manuka honey   1 Application Topical Daily   melatonin  4.5 mg Oral QHS   mupirocin ointment  1 Application Nasal BID   polyethylene glycol  17 g Oral Daily   sodium chloride flush  3 mL Intravenous Q12H   sodium chloride flush  3 mL Intravenous Q12H   Continuous Infusions:  sodium chloride       LOS: 6 days    Time spent: over 30 min    Fayrene Helper, MD [facility]   To contact the attending provider between 7A-7P or the covering provider during after hours 7P-7A, please log into the web site [email or web address] and access using universal [place] password for that web site. If you do not have the password, please call the hospital operator.  12/19/2022, 4:43 PM

## 2022-12-19 NOTE — Op Note (Signed)
Laparoscopic Cholecystectomy with intraoperative ICG cholangiography  Indications: This patient presents with chronic calculous cholecystitis and will undergo laparoscopic cholecystectomy. He was s/p perc chole tube which came out when he was found to have a patent cystic duct.  He had recurrent cholecystitis.  He had EKG changes in pre op at [facility] and [facility] and surgery.    Pre-operative Diagnosis: chronic calculous cholecystitis  Post-operative Diagnosis: Same  Surgeon: Stark Klein   Assistants: Pryor Curia, RNFA  Anesthesia: General endotracheal anesthesia and local  ASA Class: 3  Procedure Details   The patient was seen again in the Holding Room. The risks, benefits, complications, treatment options, and expected outcomes were discussed with the patient. The possibilities of  bleeding, recurrent infection, damage to nearby structures, the need for additional procedures, failure to diagnose a condition, the possible need to convert to an open procedure, and creating a complication requiring transfusion or operation were discussed with the patient. The likelihood of improving the patient's symptoms with return to their baseline status is good.    The patient and/or family concurred with the proposed plan, giving informed consent. The site of surgery properly noted. The patient was taken to OR # 1 and placed supine on the OR table.  SCDs were placed. Antibiotic prophylaxis was administered. General endotracheal anesthesia was then administered and tolerated well. After the induction, the abdomen was prepped with Chloraprep and draped in the sterile fashion. and the procedure verified as Laparoscopic Cholecystectomy with possible Intraoperative Cholangiogram. A Time Out was held and the above information confirmed.  Local anesthetic agent was injected into the skin near the umbilicus and a vertical incision was made with a #11 blade. I dissected  down to the abdominal fascia with blunt dissection.  The fascia was incised vertically and the peritoneal cavity was entered bluntly.  A pursestring suture of 0-Vicryl was placed around the fascial opening.  The Hasson cannula was inserted and secured with the stay suture.  Pneumoperitoneum was then created with CO2 and tolerated well without any adverse changes in the patient's vital signs. An 11-mm port was placed in the subxiphoid position.  Two 5-mm ports were placed in the right upper quadrant. All skin incisions were infiltrated with a local anesthetic agent before making the incision and placing the trocars.   The patient was placed into reverse Trendelenburg position and was tilted slightly to the patient's left.  The gallbladder was identified.  It was very firm and a bit intrahepatic with some omental adhesions.  These were taken down with the cautery.  Attempt was made to aspirate the gallbladder, but it was full of stones and minimal murky fluid was able to be removed.  The infundibulum was firm and had dense adhesions, so a dome-down approach was used.    ICG was administered.  The fundus grasped as best as possible and retracted laterally. Adhesions were lysed bluntly and with the electrocautery where indicated, taking care not to injure any adjacent organs or viscus. While going dome down, a small posterior diminutive arterial branch was encountered and clipped.    The ICG was utilized to confirm the identity of the cystic duct. This was skeletonized. A critical view was seen.  The cystic duct was ligated with a clip distally and three clips proximally.  This was divided.  The cystic artery was the only structure remaining and was clipped once on the patient side and doubly clipped on the patient side.  The gallbladder was placed in an Ecosac.  The fascial incision was stretched a bit in order to get the gallbladder with the stones out.  The gallbladder and bag were then removed through the  umbilical port site.  The liver bed was irrigated and inspected. Hemostasis was achieved with the electrocautery. Copious irrigation was utilized and was repeatedly aspirated until clear.    Pneumoperitoneum was released as we removed the trocars.   The pursestring suture was used to close the umbilical fascia.  Two additional 0-0 vicryl interrupted sutures were needed.    4-0 Monocryl was used to close the skin in subcuticular fashion.   The skin was cleaned and dry, and Dermabond was applied. The patient was then extubated and brought to the recovery room in stable condition. Instrument, sponge, and needle counts were correct at closure and at the conclusion of the case.   Findings: Dense adhesions from chronic cholecystitis.  Purulent translucent bile. Normal anatomy of cystic duct.     Estimated Blood Loss: min         Drains: none          Specimens: Gallbladder to pathology       Complications: None; patient tolerated the procedure well.         Disposition: PACU - hemodynamically stable.         Condition: stable

## 2022-12-20 ENCOUNTER — Encounter (HOSPITAL_COMMUNITY): Payer: Self-pay | Admitting: General Surgery

## 2022-12-20 DIAGNOSIS — K811 Chronic cholecystitis: Secondary | ICD-10-CM | POA: Diagnosis not present

## 2022-12-20 LAB — COMPREHENSIVE METABOLIC PANEL
ALT: 17 U/L (ref 0–44)
AST: 42 U/L — ABNORMAL HIGH (ref 15–41)
Albumin: 2.3 g/dL — ABNORMAL LOW (ref 3.5–5.0)
Alkaline Phosphatase: 52 U/L (ref 38–126)
Anion gap: 9 (ref 5–15)
BUN: 20 mg/dL (ref 8–23)
CO2: 25 mmol/L (ref 22–32)
Calcium: 9.2 mg/dL (ref 8.9–10.3)
Chloride: 102 mmol/L (ref 98–111)
Creatinine, Ser: 3.55 mg/dL — ABNORMAL HIGH (ref 0.61–1.24)
GFR, Estimated: 17 mL/min — ABNORMAL LOW (ref >60)
Glucose, Bld: 110 mg/dL — ABNORMAL HIGH (ref 70–99)
Potassium: 5.3 mmol/L — ABNORMAL HIGH (ref 3.5–5.1)
Sodium: 136 mmol/L (ref 135–145)
Total Bilirubin: 0.4 mg/dL (ref 0.3–1.2)
Total Protein: 7 g/dL (ref 6.5–8.1)

## 2022-12-20 LAB — CBC WITH DIFFERENTIAL/PLATELET
Abs Immature Granulocytes: 0.04 10*3/uL (ref 0.00–0.07)
Basophils Absolute: 0 10*3/uL (ref 0.0–0.1)
Basophils Relative: 0 %
Eosinophils Absolute: 0 10*3/uL (ref 0.0–0.5)
Eosinophils Relative: 0 %
HCT: 26.4 % — ABNORMAL LOW (ref 39.0–52.0)
Hemoglobin: 8.1 g/dL — ABNORMAL LOW (ref 13.0–17.0)
Immature Granulocytes: 0 %
Lymphocytes Relative: 10 %
Lymphs Abs: 1.1 10*3/uL (ref 0.7–4.0)
MCH: 31.8 pg (ref 26.0–34.0)
MCHC: 30.7 g/dL (ref 30.0–36.0)
MCV: 103.5 fL — ABNORMAL HIGH (ref 80.0–100.0)
Monocytes Absolute: 0.7 10*3/uL (ref 0.1–1.0)
Monocytes Relative: 7 %
Neutro Abs: 8.7 10*3/uL — ABNORMAL HIGH (ref 1.7–7.7)
Neutrophils Relative %: 83 %
Platelets: 196 10*3/uL (ref 150–400)
RBC: 2.55 MIL/uL — ABNORMAL LOW (ref 4.22–5.81)
RDW: 18.4 % — ABNORMAL HIGH (ref 11.5–15.5)
WBC: 10.6 10*3/uL — ABNORMAL HIGH (ref 4.0–10.5)
nRBC: 0 % (ref 0.0–0.2)

## 2022-12-20 LAB — SURGICAL PATHOLOGY

## 2022-12-20 LAB — PHOSPHORUS: Phosphorus: 4.4 mg/dL (ref 2.5–4.6)

## 2022-12-20 LAB — MAGNESIUM: Magnesium: 1.7 mg/dL (ref 1.7–2.4)

## 2022-12-20 LAB — GLUCOSE, CAPILLARY
Glucose-Capillary: 69 mg/dL — ABNORMAL LOW (ref 70–99)
Glucose-Capillary: 83 mg/dL (ref 70–99)
Glucose-Capillary: 114 mg/dL — ABNORMAL HIGH (ref 70–99)
Glucose-Capillary: 73 mg/dL (ref 70–99)

## 2022-12-20 MED ORDER — HEPARIN SODIUM (PORCINE) 1000 UNIT/ML DIALYSIS
4000.0000 [IU] | Freq: Once | INTRAMUSCULAR | Status: AC
  Administered 2022-12-20: 3200 [IU] via INTRAVENOUS_CENTRAL

## 2022-12-20 MED ORDER — ALTEPLASE 2 MG IJ SOLR: 2.0000 mg | Freq: Once | INTRAMUSCULAR | Status: DC | PRN

## 2022-12-20 MED ORDER — LIDOCAINE-PRILOCAINE 2.5-2.5 % EX CREA: 1.0000 | TOPICAL_CREAM | CUTANEOUS | Status: DC | PRN

## 2022-12-20 MED ORDER — PENTAFLUOROPROP-TETRAFLUOROETH EX AERO: 1.0000 | INHALATION_SPRAY | CUTANEOUS | Status: DC | PRN

## 2022-12-20 MED ORDER — HEPARIN SODIUM (PORCINE) 1000 UNIT/ML IJ SOLN
INTRAMUSCULAR | Status: AC
  Administered 2022-12-20: 4000 [IU]
  Filled 2022-12-20: qty 4

## 2022-12-20 MED ORDER — ANTICOAGULANT SODIUM CITRATE 4% (200MG/5ML) IV SOLN: 5.0000 mL | Status: DC | PRN

## 2022-12-20 MED ORDER — LIDOCAINE HCL (PF) 1 % IJ SOLN: 5.0000 mL | INTRAMUSCULAR | Status: DC | PRN

## 2022-12-20 NOTE — Progress Notes (Signed)
PT Cancellation Note  Patient Details Name: [name] MRN: [id number] DOB: 11-07-1946   Cancelled Treatment:    Reason Eval/Treat Not Completed: Other (comment).  Had HD this AM then declined PT as he had "surgery".  Would not agree to try bed level visit, will retry another time.   Ramond Dial 12/20/2022, 3:37 PM  Mee Hives, PT PhD Acute Rehab Dept. Number: [place] and [place]

## 2022-12-20 NOTE — Progress Notes (Signed)
Received  patient earlier before the start of treatment.Awake ,alert and oriented x 4. Vitals stable.  Access used : Right HD catheter that works well and dressing on date.  Duration of treatment : 3 hours as per ordered.  Medicines given ; Heparin 4,000 units bolus.                              Oxycodone  5 mg.  Fluid removed :2,55 cc.  Hemodialysis treatment issue : None  Hands off to the patient's nurse.

## 2022-12-20 NOTE — Progress Notes (Addendum)
[facility] Progress Note   Subjective:   Pt seen on HD. Having a lot of pain at surgery site. Otherwise no concerns, denies SOB, CP, dizziness and nausea. No appetite today.   Objective Vitals:   12/20/22 0443 12/20/22 0743 12/20/22 0750 12/20/22 0830  BP:  136/61 (!) 160/63 (!) 149/75  Pulse:  82 79 78  Resp:  19 16 16   Temp:  98.2 F (36.8 C)    TempSrc:  Oral    SpO2:  99% 99% 100%  Weight: 86.9 kg 89.1 kg    Height:       Physical Exam General: Elderly male in NAD Heart: RRR, no murmurs, rubs or gallops Lungs: CTA bilaterally without wheezing, rhonchi or rales Abdomen: Normoactive BS Extremities: No edema b/l lower extremities Dialysis Access:  [facility] accessed  Additional Objective Labs: Basic Metabolic Panel: Recent Labs  Lab 12/18/22 0431 12/19/22 0437 12/20/22 0424  NA 139 135 136  K 4.9 4.2 5.3*  CL 106 98 102  CO2 28 29 25   GLUCOSE 87 81 110*  BUN 19 12 20   CREATININE 3.71* 2.55* 3.55*  CALCIUM 9.2 8.9 9.2  PHOS 3.6 3.1 4.4   Liver Function Tests: Recent Labs  Lab 12/18/22 0431 12/19/22 0437 12/20/22 0424  AST  --   --  42*  ALT  --   --  17  ALKPHOS  --   --  52  BILITOT  --   --  0.4  PROT  --   --  7.0  ALBUMIN 1.9* 2.0* 2.3*   No results for input(s): "LIPASE", "AMYLASE" in the last 168 hours. CBC: Recent Labs  Lab 12/17/22 0404 12/17/22 1850 12/18/22 0431 12/19/22 0437 12/19/22 1815 12/20/22 0424  WBC 8.0 7.4 9.1 6.9  --  10.6*  NEUTROABS 4.7  --  5.7 3.8  --  8.7*  HGB 10.5* 8.7* 8.1* 8.2* 8.4* 8.1*  HCT 33.7* 28.4* 25.3* 26.6* 25.7* 26.4*  MCV 102.4* 104.0* 100.4* 102.7*  --  103.5*  PLT 203 168 206 188  --  196   Blood Culture    Component Value Date/Time   SDES  09/15/2022 0743    BLOOD LEFT HAND BOTTLES DRAWN AEROBIC AND ANAEROBIC   SPECREQUEST  09/15/2022 0743    Blood Culture results may not be optimal due to an excessive volume of blood received in culture bottles   CULT  09/15/2022 0743    NO  GROWTH 5 DAYS Performed at [facility], [address], [place], [place] [address]    REPTSTATUS 09/20/2022 FINAL 09/15/2022 0743    Cardiac Enzymes: No results for input(s): "CKTOTAL", "CKMB", "CKMBINDEX", "TROPONINI" in the last 168 hours. CBG: Recent Labs  Lab 12/19/22 0716 12/19/22 1039 12/19/22 1123 12/19/22 1655 12/19/22 2135  GLUCAP 76 75 92 180* 138*   Iron Studies: No results for input(s): "IRON", "TIBC", "TRANSFERRIN", "FERRITIN" in the last 72 hours. @lablastinr3 @ Studies/Results: No results found. Medications:  sodium chloride     anticoagulant sodium citrate      allopurinol  100 mg Oral Daily   aspirin EC  81 mg Oral Daily   atorvastatin  40 mg Oral Daily   Chlorhexidine Gluconate Cloth  6 each Topical Q0600   cycloSPORINE  1 drop Both Eyes BID   darbepoetin (ARANESP) injection - DIALYSIS  200 mcg Subcutaneous Q Fri-1800   docusate sodium  100 mg Oral BID   gabapentin  300 mg Oral QID   heparin  4,000  Units Dialysis Once in dialysis   heparin  5,000 Units Subcutaneous Q8H   hydrALAZINE  10 mg Oral BID   isosorbide dinitrate  5 mg Oral BID   leptospermum manuka honey  1 Application Topical Daily   melatonin  4.5 mg Oral QHS   mupirocin ointment  1 Application Nasal BID   polyethylene glycol  17 g Oral Daily   sodium chloride flush  3 mL Intravenous Q12H   sodium chloride flush  3 mL Intravenous Q12H    Dialysis Orders:  [facility] MWF 4h  400/800  84.5kg  2/2.5 bath   Heparin 4000 units IV  RIJ TDC - last OP HD 12/20, post wt 85.9kg - last IP hD 12/24, post wt 85kg - mircera 225 mcg IV q2, last 12/18, due 1/02 - no vdra    Assessment/Plan: Chronic cholecystitis - w/ nausea, malnutrition, FTT.  S/p laparoscopic cholecystectomy  Chronic HFrEF - LVEF up to 50-55% here, last was 35-40%. LHC done 12/17/2022. Moderate diffuse CAD. No intervention needed per cardiology. Treat medically.  ESRD - on HD MWF. Tolerating HD well.  HTN/ volume -BP stable. No  evidence of excess volume by exam. UF as tolerated. Anemia esrd - Hb 8.1 down from 10.5 12/17/2022. Will redose ESA in HD 12/20/2022. Check iron panel.  MBD ckd - CCa and phos in range.  DM2 - per pmd    Anice Paganini, PA-C 12/20/2022, 9:05 AM  [facility] Pager: [phone]

## 2022-12-20 NOTE — Progress Notes (Signed)
1 Day Post-Op   Subjective/Chief Complaint: Seen today in hemodialysis.  Complains of abdominal soreness.   Objective: Vital signs in last 24 hours: Temp:  [97.8 F (36.6 C)-98.5 F (36.9 C)] 98.2 F (36.8 C) (12/29 0743) Pulse Rate:  [62-92] 79 (12/29 0750) Resp:  [14-20] 16 (12/29 0750) BP: (93-160)/(50-92) 160/63 (12/29 0750) SpO2:  [93 %-100 %] 99 % (12/29 0750) Weight:  [86.9 kg-89.1 kg] 89.1 kg (12/29 0743) Last BM Date : 12/13/22  Intake/Output from previous day: 12/28 0701 - 12/29 0700 In: 750 [I.V.:500; IV Piggyback:250] Out: 20 [Blood:20] Intake/Output this shift: No intake/output data recorded.  Abdomen: Incisions clean dry intact.  No peritonitis.  Appropriately sore to examine  Lab Results:  Recent Labs    12/19/22 0437 12/19/22 1815 12/20/22 0424  WBC 6.9  --  10.6*  HGB 8.2* 8.4* 8.1*  HCT 26.6* 25.7* 26.4*  PLT 188  --  196   BMET Recent Labs    12/19/22 0437 12/20/22 0424  NA 135 136  K 4.2 5.3*  CL 98 102  CO2 29 25  GLUCOSE 81 110*  BUN 12 20  CREATININE 2.55* 3.55*  CALCIUM 8.9 9.2   PT/INR No results for input(s): "LABPROT", "INR" in the last 72 hours. ABG No results for input(s): "PHART", "HCO3" in the last 72 hours.  Invalid input(s): "PCO2", "PO2"  Studies/Results: No results found.  Anti-infectives: Anti-infectives (From admission, onward)    Start     Dose/Rate Route Frequency Ordered Stop   12/13/22 0600  cefoTEtan (CEFOTAN) 2 g in sodium chloride 0.9 % 100 mL IVPB  Status:  Discontinued        2 g 200 mL/hr over 30 Minutes Intravenous On call to O.R. 12/12/22 1631 12/14/22 0559   12/12/22 0615  cefoTEtan (CEFOTAN) 2 g in sodium chloride 0.9 % 100 mL IVPB  Status:  Discontinued        2 g 200 mL/hr over 30 Minutes Intravenous On call to O.R. 12/12/22 3818 12/12/22 0913       Assessment/Plan: s/p Procedure(s): LAPAROSCOPIC CHOLECYSTECTOMY (N/A) Stable for discharge when medically stable.  LOS: 7 days     Turner Daniels MD 12/20/2022

## 2022-12-20 NOTE — Progress Notes (Addendum)
PROGRESS NOTE    [name]  JKK:[id number] DOB: 17-Jun-1946 DOA: 12/12/2022 PCP: Redmond School, MD  No chief complaint on file.   Brief Narrative:  [name] is [name] 76 y.o. male with PMH ESRD on MWF HD, HFrEF, T2DM, HTN who presented with nausea. He has [name] known history of cholecystitis and had undergone cholecystostomy tube placement.  His tube had become dislodged and he underwent evaluation with surgery.  He was noted to have recanalization of his bile duct and therefore the tube was not replaced. He presented to the hospital for elective cholecystectomy and underwent preop evaluation with cardiology.  His workup was notable for diffuse T wave inversions and prolonged QTc.  He was also noted to be significantly hypokalemic.  He was started on electrolyte replacement and admitted for further workup and monitoring. Nephrology was also consulted to continue his chronic HD during hospitalization. Despite improved metabolic parameters, ECG changes persisted, so he was transferred to [facility] for cardiac catheterization on 12/26.   Assessment & Plan:   Principal Problem:   Chronic cholecystitis Active Problems:   ESRD (end stage renal disease) ([place])   Coronary artery disease involving native coronary artery of native heart without angina pectoris   Hypokalemia   Diabetes mellitus type 2 in obese (HCC)   Anemia, normocytic normochromic   Essential hypertension   Heart failure with improved ejection fraction (HFimpEF) (HCC)   HFrEF (heart failure with reduced ejection fraction) (HCC)   Nausea and vomiting   Pressure injury of skin of sacral region   Prolonged QT interval   Generalized weakness   Abnormal EKG   Preoperative cardiovascular examination  Chronic cholecystitis: Causing failure to thrive, malnutrition.  -now s/p cholecystectomy 12/28 with intraoperative ICG cholangiography -per gen surgery, stable for d/c when medically stable   Chronic HFrEF, abnormal ECG: Repeat echo  this admission with LVEF up to 50-55% from 35-40% in August 2023.  - Manage volume with HD - Cotninue cardiac monitoring - LHC 12/26 with multivessel diffuse moderate CAD with no acute or obvious flow limiting lesions (in absence of any active anginal symptoms, would not PCI in preop setting)  - recommending medical therapy, monitor for symptoms, ok to proceed with operation - high dose statin - Cardiology following patient   ESRD: Last HD off schedule early 12/24. Typically MWF.  - Nephrology consulted for HD.  - Renal function panel ordered today.    Anemia of ESRD:  - ESA per nephrology - appears to have received 1 unit pRBC during this hospitalization    Hypokalemia: Resolved.    T2DM:  A1c 4.4 (likely reflecting his recent failure to thrive, poor PO intake) D/c SSI   Functional Quadriplegia  Essentially bedbound for the past year (needs help toileting, turning in bed, dressing, etc) Not quite clear to me the cause  Unstageable sacral pressure injury, POA: Uninfected appearing.  Offload, local wound care Pressure Injury 11/06/22 Sacrum Mid Unstageable - Full thickness tissue loss in which the base of the injury is covered by slough (yellow, tan, gray, green or brown) and/or eschar (tan, brown or black) in the wound bed. reddended area to the [name] with (Active)  11/06/22 1315  Location: Sacrum  Location Orientation: Mid  Staging: Unstageable - Full thickness tissue loss in which the base of the injury is covered by slough (yellow, tan, gray, green or brown) and/or eschar (tan, brown or black) in the wound bed.  Wound Description (Comments): reddended area to the sacruum with yellow  eschar and foul smell  Present on Admission: Yes      DVT prophylaxis: heparin Code Status: full Family Communication: brother [name]), called both nieces, but no answer Disposition:   Status is: Inpatient Remains inpatient appropriate because: continued   Consultants:   Renal Surgery cardiology  Procedures:  12/28  Laparoscopic Cholecystectomy with intraoperative ICG cholangiography      LHC   Prox RCA-1 lesion is 50% stenosed. Prox RCA-2 lesion is 20% stenosed.   Mid RCA to Dist RCA lesion is 65% stenosed.   RPDA lesion is 60% stenosed.   RPAV lesion is 15% stenosed.  2nd RPL lesion is 30% stenosed.   Dist LAD-1 lesion is 55% stenosed. Dist/apical LAD-2 lesion is 75% stenosed.   Dist/apical LAD-2 lesion is 75% stenosed.   Prox 3rd Mrg is 40% stenosed   LV end diastolic pressure is normal.   POST CATH DIAGNOSES Mulitvessel diffuse Moderate CAD with no acute or obvious Flow-Limiting lesions. Prox RCA eccentril 50-55%, distal RCA eccentric calcified shelf lesion ~60-65% & ostial rPDA 60% --> In tandem could potentialy be RFR/FFR positive. However, in the absence of any ongoing active anginal symptoms, would not PCI in the preop setting. Early distal LAD eccentric 55 to 60% and apical 70% stenosed this cannot amenable for PCI. Otherwise diffuse mild to moderate disease in the LCx Normal LVEDP-normal EF by Echo      RECOMMENDATIONS Aggressive to direct medical therapy for moderate diffuse CAD.  Monitor for symptoms.  But for now would simply treat medically. Would proceed with planned operation without further cardiac evaluation given nonobstructive CAD and normal echo.   Echo IMPRESSIONS     1. Limited Echo to evaluate LVEF   2. Left ventricular ejection fraction, by estimation, is 50 to 55%. The  left ventricle has low normal function. The left ventricle has no regional  wall motion abnormalities. Left ventricular diastolic function could not  be evaluated.   3. The mitral valve is grossly normal. Mild mitral valve regurgitation.  No evidence of mitral stenosis.   4. The inferior vena cava is normal in size with greater than 50%  respiratory variability, suggesting right atrial pressure of 3 mmHg.   Comparison(s): Changes from prior  study are noted. LVEF improved from  35-40% in 07/2022.   Antimicrobials:  Anti-infectives (From admission, onward)    Start     Dose/Rate Route Frequency Ordered Stop   12/13/22 0600  cefoTEtan (CEFOTAN) 2 g in sodium chloride 0.9 % 100 mL IVPB  Status:  Discontinued        2 g 200 mL/hr over 30 Minutes Intravenous On call to O.R. 12/12/22 1631 12/14/22 0559   12/12/22 0615  cefoTEtan (CEFOTAN) 2 g in sodium chloride 0.9 % 100 mL IVPB  Status:  Discontinued        2 g 200 mL/hr over 30 Minutes Intravenous On call to O.R. 12/12/22 0614 12/12/22 0913       Subjective: Continued abdominal pain  Objective: Vitals:   12/20/22 1204 12/20/22 1236 12/20/22 1306 12/20/22 1336  BP: (!) 143/81 119/80 106/62 (!) 123/57  Pulse: (!) 56     Resp: 17     Temp: 98.1 F (36.7 C)     TempSrc: Oral     SpO2: 99%     Weight:      Height:        Intake/Output Summary (Last 24 hours) at 12/20/2022 1651 Last data filed at 12/20/2022 1057 Gross per 24  hour  Intake --  Output 2.5 ml  Net -2.5 ml   Filed Weights   12/20/22 0443 12/20/22 0743 12/20/22 1057  Weight: 86.9 kg 89.1 kg 86.6 kg    Examination:  General: No acute distress. Cardiovascular: RRR Lungs: unlabored Abdomen:appropriately tender, laparosopic incisions. Neurological: Alert and oriented 3. Moves all extremities 4 with equal strength. Cranial nerves II through XII grossly intact. Extremities: No clubbing or cyanosis. No edema.   Data Reviewed: I have personally reviewed following labs and imaging studies  CBC: Recent Labs  Lab 12/16/22 0445 12/17/22 0404 12/17/22 1850 12/18/22 0431 12/19/22 0437 12/19/22 1815 12/20/22 0424  WBC 8.0 8.0 7.4 9.1 6.9  --  10.6*  NEUTROABS 4.3 4.7  --  5.7 3.8  --  8.7*  HGB 7.9* 10.5* 8.7* 8.1* 8.2* 8.4* 8.1*  HCT 25.1* 33.7* 28.4* 25.3* 26.6* 25.7* 26.4*  MCV 102.4* 102.4* 104.0* 100.4* 102.7*  --  103.5*  PLT 188 203 168 206 188  --  629    Basic Metabolic  Panel: Recent Labs  Lab 12/16/22 0445 12/17/22 0404 12/17/22 1850 12/17/22 1912 12/18/22 0431 12/19/22 0437 12/20/22 0424  NA 141  --   --  139 139 135 136  K 4.2  --   --  4.9 4.9 4.2 5.3*  CL 105  --   --  106 106 98 102  CO2 29  --   --  26 28 29 25   GLUCOSE 87  --   --  104* 87 81 110*  BUN 9  --   --  17 19 12 20   CREATININE 2.21*  --  3.38* 3.34* 3.71* 2.55* 3.55*  CALCIUM 8.8*  --   --  9.2 9.2 8.9 9.2  MG 1.9 2.0  --   --  1.9 1.6* 1.7  PHOS 2.1*  --   --  3.6 3.6 3.1 4.4    GFR: Estimated Creatinine Clearance: 20.8 mL/min (Talyia Allende) (by C-G formula based on SCr of 3.55 mg/dL (H)).  Liver Function Tests: Recent Labs  Lab 12/16/22 0445 12/17/22 1912 12/18/22 0431 12/19/22 0437 12/20/22 0424  AST  --   --   --   --  42*  ALT  --   --   --   --  17  ALKPHOS  --   --   --   --  52  BILITOT  --   --   --   --  0.4  PROT  --   --   --   --  7.0  ALBUMIN 2.1* 2.1* 1.9* 2.0* 2.3*    CBG: Recent Labs  Lab 12/19/22 1655 12/19/22 2135 12/20/22 1203 12/20/22 1311 12/20/22 1628  GLUCAP 180* 138* 69* 83 114*     Recent Results (from the past 240 hour(s))  Surgical PCR screen     Status: None   Collection Time: 12/17/22  2:09 AM   Specimen: Nasal Mucosa; Nasal Swab  Result Value Ref Range Status   MRSA, PCR NEGATIVE NEGATIVE Final   Staphylococcus aureus NEGATIVE NEGATIVE Final    Comment: (NOTE) The Xpert SA Assay (FDA approved for NASAL specimens in patients 11 years of age and older), is one component of Kaitlynn Tramontana comprehensive surveillance program. It is not intended to diagnose infection nor to guide or monitor treatment. Performed at [facility], [place] [address], [place], [place] [address]          Radiology Studies: No results found.  Scheduled Meds:  allopurinol  100 mg Oral Daily   aspirin EC  81 mg Oral Daily   atorvastatin  40 mg Oral Daily   Chlorhexidine Gluconate Cloth  6 each Topical Q0600   cycloSPORINE  1 drop Both Eyes BID    darbepoetin (ARANESP) injection - DIALYSIS  200 mcg Subcutaneous Q Fri-1800   docusate sodium  100 mg Oral BID   gabapentin  300 mg Oral QID   heparin  5,000 Units Subcutaneous Q8H   hydrALAZINE  10 mg Oral BID   isosorbide dinitrate  5 mg Oral BID   leptospermum manuka honey  1 Application Topical Daily   melatonin  4.5 mg Oral QHS   mupirocin ointment  1 Application Nasal BID   polyethylene glycol  17 g Oral Daily   sodium chloride flush  3 mL Intravenous Q12H   sodium chloride flush  3 mL Intravenous Q12H   Continuous Infusions:  sodium chloride       LOS: 7 days    Time spent: over 30 min    Fayrene Helper, MD [facility]   To contact the attending provider between 7A-7P or the covering provider during after hours 7P-7A, please log into the web site [email or web address] and access using universal [place] password for that web site. If you do not have the password, please call the hospital operator.  12/20/2022, 4:51 PM

## 2022-12-20 NOTE — Progress Notes (Signed)
OT Cancellation Note  Patient Details Name: OLNEY MONIER MRN: [id number] DOB: 1946/11/30   Cancelled Treatment:    Reason Eval/Treat Not Completed: Patient at procedure or test/ unavailable (Patient off unit in HD. Will attempt later today as schedule permits) Lodema Hong, [place]  Office [place] 12/20/2022, 8:43 AM

## 2022-12-21 DIAGNOSIS — K811 Chronic cholecystitis: Secondary | ICD-10-CM | POA: Diagnosis not present

## 2022-12-21 LAB — CBC WITH DIFFERENTIAL/PLATELET
Abs Immature Granulocytes: 0.02 10*3/uL (ref 0.00–0.07)
Basophils Absolute: 0 10*3/uL (ref 0.0–0.1)
Basophils Relative: 0 %
Eosinophils Absolute: 0.4 10*3/uL (ref 0.0–0.5)
Eosinophils Relative: 4 %
HCT: 24.8 % — ABNORMAL LOW (ref 39.0–52.0)
Hemoglobin: 7.7 g/dL — ABNORMAL LOW (ref 13.0–17.0)
Immature Granulocytes: 0 %
Lymphocytes Relative: 19 %
Lymphs Abs: 1.7 10*3/uL (ref 0.7–4.0)
MCH: 31.7 pg (ref 26.0–34.0)
MCHC: 31 g/dL (ref 30.0–36.0)
MCV: 102.1 fL — ABNORMAL HIGH (ref 80.0–100.0)
Monocytes Absolute: 1.3 10*3/uL — ABNORMAL HIGH (ref 0.1–1.0)
Monocytes Relative: 15 %
Neutro Abs: 5.2 10*3/uL (ref 1.7–7.7)
Neutrophils Relative %: 62 %
Platelets: 206 10*3/uL (ref 150–400)
RBC: 2.43 MIL/uL — ABNORMAL LOW (ref 4.22–5.81)
RDW: 18 % — ABNORMAL HIGH (ref 11.5–15.5)
WBC: 8.5 10*3/uL (ref 4.0–10.5)
nRBC: 0 % (ref 0.0–0.2)

## 2022-12-21 LAB — RENAL FUNCTION PANEL
Albumin: 2.1 g/dL — ABNORMAL LOW (ref 3.5–5.0)
Anion gap: 11 (ref 5–15)
BUN: 11 mg/dL (ref 8–23)
CO2: 28 mmol/L (ref 22–32)
Calcium: 8.8 mg/dL — ABNORMAL LOW (ref 8.9–10.3)
Chloride: 98 mmol/L (ref 98–111)
Creatinine, Ser: 2.54 mg/dL — ABNORMAL HIGH (ref 0.61–1.24)
GFR, Estimated: 25 mL/min — ABNORMAL LOW (ref >60)
Glucose, Bld: 82 mg/dL (ref 70–99)
Phosphorus: 3.1 mg/dL (ref 2.5–4.6)
Potassium: 3.6 mmol/L (ref 3.5–5.1)
Sodium: 137 mmol/L (ref 135–145)

## 2022-12-21 LAB — MAGNESIUM: Magnesium: 1.6 mg/dL — ABNORMAL LOW (ref 1.7–2.4)

## 2022-12-21 LAB — GLUCOSE, CAPILLARY
Glucose-Capillary: 78 mg/dL (ref 70–99)
Glucose-Capillary: 97 mg/dL (ref 70–99)
Glucose-Capillary: 121 mg/dL — ABNORMAL HIGH (ref 70–99)
Glucose-Capillary: 121 mg/dL — ABNORMAL HIGH (ref 70–99)

## 2022-12-21 MED ORDER — POLYETHYLENE GLYCOL 3350 17 G PO PACK
17.0000 g | PACK | Freq: Every day | ORAL | Status: DC | PRN
  Administered 2023-01-11: 17 g via ORAL
  Filled 2022-12-21: qty 1

## 2022-12-21 MED ORDER — GERHARDT'S BUTT CREAM
TOPICAL_CREAM | Freq: Two times a day (BID) | CUTANEOUS | Status: DC
  Filled 2022-12-21 (×2): qty 1

## 2022-12-21 MED ORDER — SENNOSIDES-DOCUSATE SODIUM 8.6-50 MG PO TABS
1.0000 | ORAL_TABLET | Freq: Two times a day (BID) | ORAL | Status: DC
  Administered 2022-12-21 – 2023-01-07 (×33): 1 via ORAL
  Filled 2022-12-21 (×33): qty 1

## 2022-12-21 MED ORDER — CHLORHEXIDINE GLUCONATE CLOTH 2 % EX PADS: 6.0000 | MEDICATED_PAD | Freq: Every day | CUTANEOUS | Status: DC

## 2022-12-21 NOTE — Progress Notes (Addendum)
[facility] Progress Note   Subjective:   Pt reports feeling ok, denies SOB, CP, palpitations, dizziness and nausea. In a.fib on monitor but rate controlled, asymptomatic. Reports known history of a.fib.   Objective Vitals:   12/20/22 1336 12/20/22 2047 12/21/22 0426 12/21/22 0749  BP: (!) 123/57 (!) 103/53 (!) 131/59 122/63  Pulse:  72 71 64  Resp:  18 18 18   Temp:  98.1 F (36.7 C) 98 F (36.7 C) 98.3 F (36.8 C)  TempSrc:  Oral Oral Oral  SpO2:  99% 100% 100%  Weight:      Height:       Physical Exam General: Alert male in NAD Heart:Irregularly irregular, no murmur Lungs: CTA bilaterally, no wheezing, rhonchi or rales Abdomen: Soft, non-distended, +BS Extremities: No edema b/l lower extremities Dialysis Access: R IJ [facility]  Additional Objective Labs: Basic Metabolic Panel: Recent Labs  Lab 12/19/22 0437 12/20/22 0424 12/21/22 0145  NA 135 136 137  K 4.2 5.3* 3.6  CL 98 102 98  CO2 29 25 28   GLUCOSE 81 110* 82  BUN 12 20 11   CREATININE 2.55* 3.55* 2.54*  CALCIUM 8.9 9.2 8.8*  PHOS 3.1 4.4 3.1   Liver Function Tests: Recent Labs  Lab 12/19/22 0437 12/20/22 0424 12/21/22 0145  AST  --  42*  --   ALT  --  17  --   ALKPHOS  --  52  --   BILITOT  --  0.4  --   PROT  --  7.0  --   ALBUMIN 2.0* 2.3* 2.1*   No results for input(s): "LIPASE", "AMYLASE" in the last 168 hours. CBC: Recent Labs  Lab 12/17/22 1850 12/18/22 0431 12/19/22 0437 12/19/22 1815 12/20/22 0424 12/21/22 0145  WBC 7.4 9.1 6.9  --  10.6* 8.5  NEUTROABS  --  5.7 3.8  --  8.7* 5.2  HGB 8.7* 8.1* 8.2* 8.4* 8.1* 7.7*  HCT 28.4* 25.3* 26.6* 25.7* 26.4* 24.8*  MCV 104.0* 100.4* 102.7*  --  103.5* 102.1*  PLT 168 206 188  --  196 206   Blood Culture    Component Value Date/Time   SDES  09/15/2022 0743    BLOOD LEFT HAND BOTTLES DRAWN AEROBIC AND ANAEROBIC   SPECREQUEST  09/15/2022 0743    Blood Culture results may not be optimal due to an excessive volume of blood  received in culture bottles   CULT  09/15/2022 0743    NO GROWTH 5 DAYS Performed at [facility], [address], [place], [place] [address]    REPTSTATUS 09/20/2022 FINAL 09/15/2022 0743    Cardiac Enzymes: No results for input(s): "CKTOTAL", "CKMB", "CKMBINDEX", "TROPONINI" in the last 168 hours. CBG: Recent Labs  Lab 12/20/22 1203 12/20/22 1311 12/20/22 1628 12/20/22 2150 12/21/22 0747  GLUCAP 69* 83 114* 73 78   Iron Studies: No results for input(s): "IRON", "TIBC", "TRANSFERRIN", "FERRITIN" in the last 72 hours. @lablastinr3 @ Studies/Results: No results found. Medications:  sodium chloride      allopurinol  100 mg Oral Daily   aspirin EC  81 mg Oral Daily   atorvastatin  40 mg Oral Daily   Chlorhexidine Gluconate Cloth  6 each Topical Q0600   cycloSPORINE  1 drop Both Eyes BID   darbepoetin (ARANESP) injection - DIALYSIS  200 mcg Subcutaneous Q Fri-1800   gabapentin  300 mg Oral QID   heparin  5,000 Units Subcutaneous Q8H   isosorbide dinitrate  5 mg Oral BID   leptospermum  manuka honey  1 Application Topical Daily   melatonin  4.5 mg Oral QHS   mupirocin ointment  1 Application Nasal BID   polyethylene glycol  17 g Oral Daily   senna-docusate  1 tablet Oral BID   sodium chloride flush  3 mL Intravenous Q12H   sodium chloride flush  3 mL Intravenous Q12H    Dialysis Orders: [facility] MWF 4h  400/800  84.5kg  2/2.5 bath   Heparin 4000 units IV  RIJ TDC - last OP HD 12/20, post wt 85.9kg - last IP hD 12/24, post wt 85kg - mircera 225 mcg IV q2, last 12/18, due 1/02 - no vdra  Assessment/Plan: Chronic cholecystitis - w/ nausea, malnutrition, FTT.  S/p laparoscopic cholecystectomy  Chronic HFrEF - LVEF up to 50-55% here, last was 35-40%. LHC done 12/17/2022. Moderate diffuse CAD. No intervention needed per cardiology. Treating medically.  ESRD - on HD MWF. Tolerating HD well. HD unit is on a holiday schedule this week. Patient will have dialysis Sunday, Wednesday  and Friday.   HTN/ volume -BP stable. No evidence of excess volume by exam. UF as tolerated. Anemia esrd - Hb 7.7, down from 10.5 12/17/2022. Aranesp given 12/29. Checking iron panel with AM labs tomorrow.  MBD ckd - CCa and phos controlled, not on a binder DM2 - per pmd      Anice Paganini, PA-C 12/21/2022, 9:03 AM  [facility] Pager: [phone]

## 2022-12-21 NOTE — Consult Note (Signed)
WOC Nurse Consult Note: Reason for Consult: Left upper gluteal wound near sacrum.  Unstageable due to presence of devitalized tissue  Has been getting daily medihoney will minimal improvement.  Moderate malnutrition noted.  Wound type:Pressure Pressure Injury POA: Yes Measurement: 4 cmx 5 cm x 3 cm  Wound bed: 50% gray slough 50% pale pink nongranulating Drainage (amount, consistency, odor) moderate serosanguinous  no odor Periwound: intact Dressing procedure/placement/frequency: Cleanse wound to left buttock with NS and pat dry. Apply calcium alginate to wound bed.  (Alvidrez # [id number]) Fill remaining dead space with fluffed gauze.  Top with ABD pad and tape.  Change daily. Gerhardts paste to gluteal folds twice daily and PRN soilage.  Will not follow at this time.  Please re-consult if needed.  Estrellita Ludwig MSN, RN, FNP-BC CWON Wound, Ostomy, Continence Nurse [facility] [phone] Pager [phone]

## 2022-12-21 NOTE — Progress Notes (Signed)
PROGRESS NOTE    [name]  WHQ:[id number] DOB: 09-Jul-1946 DOA: 12/12/2022 PCP: Redmond School, MD  No chief complaint on file.   Brief Narrative:  [name] is [name] 76 y.o. male with PMH ESRD on MWF HD, HFrEF, T2DM, HTN who presented with nausea. He has [name] known history of cholecystitis and had undergone cholecystostomy tube placement.  His tube had become dislodged and he underwent evaluation with surgery.  He was noted to have recanalization of his bile duct and therefore the tube was not replaced. He presented to the hospital for elective cholecystectomy and underwent preop evaluation with cardiology.  His workup was notable for diffuse T wave inversions and prolonged QTc.  He was also noted to be significantly hypokalemic.  He was started on electrolyte replacement and admitted for further workup and monitoring. Nephrology was also consulted to continue his chronic HD during hospitalization. Despite improved metabolic parameters, ECG changes persisted, so he was transferred to [facility] for cardiac catheterization on 12/26.   Assessment & Plan:   Principal Problem:   Chronic cholecystitis Active Problems:   ESRD (end stage renal disease) ([place])   Coronary artery disease involving native coronary artery of native heart without angina pectoris   Hypokalemia   Diabetes mellitus type 2 in obese (HCC)   Anemia, normocytic normochromic   Essential hypertension   Heart failure with improved ejection fraction (HFimpEF) (HCC)   HFrEF (heart failure with reduced ejection fraction) (HCC)   Nausea and vomiting   Pressure injury of skin of sacral region   Prolonged QT interval   Generalized weakness   Abnormal EKG   Preoperative cardiovascular examination  Chronic cholecystitis: Causing failure to thrive, malnutrition.  -now s/p cholecystectomy 12/28 with intraoperative ICG cholangiography -per gen surgery, stable for d/c when medically stable   Chronic HFrEF, abnormal ECG: Repeat echo  this admission with LVEF up to 50-55% from 35-40% in August 2023.  - Manage volume with HD - Cotninue cardiac monitoring - LHC 12/26 with multivessel diffuse moderate CAD with no acute or obvious flow limiting lesions (in absence of any active anginal symptoms, would not PCI in preop setting)  - recommending medical therapy, monitor for symptoms, ok to proceed with operation - high dose statin - Cardiology following patient   ESRD: Last HD off schedule early 12/24. Typically MWF.  - Nephrology consulted for HD.    Anemia of ESRD:  - ESA per nephrology - appears to have received 1 unit pRBC during this hospitalization    Hypokalemia: Resolved.    T2DM:  A1c 4.4 (likely reflecting his recent failure to thrive, poor PO intake) D/c SSI   Functional Quadriplegia  Essentially bedbound for the past year (needs help toileting, turning in bed, dressing, etc) Not quite clear to me the cause Follow outpatient  Unstageable sacral pressure injury, POA: Uninfected appearing.  Offload, local wound care Pressure Injury 11/06/22 Sacrum Mid Unstageable - Full thickness tissue loss in which the base of the injury is covered by slough (yellow, tan, gray, green or brown) and/or eschar (tan, brown or black) in the wound bed. reddended area to the [place] with (Active)  11/06/22 1315  Location: Sacrum  Location Orientation: Mid  Staging: Unstageable - Full thickness tissue loss in which the base of the injury is covered by slough (yellow, tan, gray, green or brown) and/or eschar (tan, brown or black) in the wound bed.  Wound Description (Comments): reddended area to the sacruum with yellow eschar and foul smell  Present on Admission: Yes      DVT prophylaxis: heparin Code Status: full Family Communication: none Disposition:   Status is: Inpatient Remains inpatient appropriate because: continued   Consultants:  Renal Surgery cardiology  Procedures:  12/28  Laparoscopic Cholecystectomy  with intraoperative ICG cholangiography      LHC   Prox RCA-1 lesion is 50% stenosed. Prox RCA-2 lesion is 20% stenosed.   Mid RCA to Dist RCA lesion is 65% stenosed.   RPDA lesion is 60% stenosed.   RPAV lesion is 15% stenosed.  2nd RPL lesion is 30% stenosed.   Dist LAD-1 lesion is 55% stenosed. Dist/apical LAD-2 lesion is 75% stenosed.   Dist/apical LAD-2 lesion is 75% stenosed.   Prox 3rd Mrg is 40% stenosed   LV end diastolic pressure is normal.   POST CATH DIAGNOSES Mulitvessel diffuse Moderate CAD with no acute or obvious Flow-Limiting lesions. Prox RCA eccentril 50-55%, distal RCA eccentric calcified shelf lesion ~60-65% & ostial rPDA 60% --> In tandem could potentialy be RFR/FFR positive. However, in the absence of any ongoing active anginal symptoms, would not PCI in the preop setting. Early distal LAD eccentric 55 to 60% and apical 70% stenosed this cannot amenable for PCI. Otherwise diffuse mild to moderate disease in the LCx Normal LVEDP-normal EF by Echo      RECOMMENDATIONS Aggressive to direct medical therapy for moderate diffuse CAD.  Monitor for symptoms.  But for now would simply treat medically. Would proceed with planned operation without further cardiac evaluation given nonobstructive CAD and normal echo.   Echo IMPRESSIONS     1. Limited Echo to evaluate LVEF   2. Left ventricular ejection fraction, by estimation, is 50 to 55%. The  left ventricle has low normal function. The left ventricle has no regional  wall motion abnormalities. Left ventricular diastolic function could not  be evaluated.   3. The mitral valve is grossly normal. Mild mitral valve regurgitation.  No evidence of mitral stenosis.   4. The inferior vena cava is normal in size with greater than 50%  respiratory variability, suggesting right atrial pressure of 3 mmHg.   Comparison(s): Changes from prior study are noted. LVEF improved from  35-40% in 07/2022.   Antimicrobials:   Anti-infectives (From admission, onward)    Start     Dose/Rate Route Frequency Ordered Stop   12/13/22 0600  cefoTEtan (CEFOTAN) 2 g in sodium chloride 0.9 % 100 mL IVPB  Status:  Discontinued        2 g 200 mL/hr over 30 Minutes Intravenous On call to O.R. 12/12/22 1631 12/14/22 0559   12/12/22 0615  cefoTEtan (CEFOTAN) 2 g in sodium chloride 0.9 % 100 mL IVPB  Status:  Discontinued        2 g 200 mL/hr over 30 Minutes Intravenous On call to O.R. 12/12/22 0614 12/12/22 0913       Subjective: Continued abdominal discomfort Improving Didn't eat Cristian Grieves whole lot today  Objective: Vitals:   12/20/22 1336 12/20/22 2047 12/21/22 0426 12/21/22 0749  BP: (!) 123/57 (!) 103/53 (!) 131/59 122/63  Pulse:  72 71 64  Resp:  18 18 18   Temp:  98.1 F (36.7 C) 98 F (36.7 C) 98.3 F (36.8 C)  TempSrc:  Oral Oral Oral  SpO2:  99% 100% 100%  Weight:      Height:        Intake/Output Summary (Last 24 hours) at 12/21/2022 1757 Last data filed at 12/21/2022 0430 Gross per 24  hour  Intake --  Output 100 ml  Net -100 ml   Filed Weights   12/20/22 0443 12/20/22 0743 12/20/22 1057  Weight: 86.9 kg 89.1 kg 86.6 kg    Examination:  General: No acute distress. Cardiovascular: RRR Lungs: unlabored Abdomen: Soft, appropriately tender Neurological: Alert and oriented 3. Moves all extremities 4 . Cranial nerves II through XII grossly intact. Skin: decub not examined Extremities: No clubbing or cyanosis. No edema.   Data Reviewed: I have personally reviewed following labs and imaging studies  CBC: Recent Labs  Lab 12/17/22 0404 12/17/22 1850 12/18/22 0431 12/19/22 0437 12/19/22 1815 12/20/22 0424 12/21/22 0145  WBC 8.0 7.4 9.1 6.9  --  10.6* 8.5  NEUTROABS 4.7  --  5.7 3.8  --  8.7* 5.2  HGB 10.5* 8.7* 8.1* 8.2* 8.4* 8.1* 7.7*  HCT 33.7* 28.4* 25.3* 26.6* 25.7* 26.4* 24.8*  MCV 102.4* 104.0* 100.4* 102.7*  --  103.5* 102.1*  PLT 203 168 206 188  --  196 206    Basic  Metabolic Panel: Recent Labs  Lab 12/17/22 0404 12/17/22 1850 12/17/22 1912 12/18/22 0431 12/19/22 0437 12/20/22 0424 12/21/22 0145  NA  --   --  139 139 135 136 137  K  --   --  4.9 4.9 4.2 5.3* 3.6  CL  --   --  106 106 98 102 98  CO2  --   --  26 28 29 25 28   GLUCOSE  --   --  104* 87 81 110* 82  BUN  --   --  17 19 12 20 11   CREATININE  --    < > 3.34* 3.71* 2.55* 3.55* 2.54*  CALCIUM  --   --  9.2 9.2 8.9 9.2 8.8*  MG 2.0  --   --  1.9 1.6* 1.7 1.6*  PHOS  --   --  3.6 3.6 3.1 4.4 3.1   < > = values in this interval not displayed.    GFR: Estimated Creatinine Clearance: 29.1 mL/min (Amareon Phung) (by C-G formula based on SCr of 2.54 mg/dL (H)).  Liver Function Tests: Recent Labs  Lab 12/17/22 1912 12/18/22 0431 12/19/22 0437 12/20/22 0424 12/21/22 0145  AST  --   --   --  42*  --   ALT  --   --   --  17  --   ALKPHOS  --   --   --  52  --   BILITOT  --   --   --  0.4  --   PROT  --   --   --  7.0  --   ALBUMIN 2.1* 1.9* 2.0* 2.3* 2.1*    CBG: Recent Labs  Lab 12/20/22 1628 12/20/22 2150 12/21/22 0747 12/21/22 1209 12/21/22 1606  GLUCAP 114* 73 78 97 121*     Recent Results (from the past 240 hour(s))  Surgical PCR screen     Status: None   Collection Time: 12/17/22  2:09 AM   Specimen: Nasal Mucosa; Nasal Swab  Result Value Ref Range Status   MRSA, PCR NEGATIVE NEGATIVE Final   Staphylococcus aureus NEGATIVE NEGATIVE Final    Comment: (NOTE) The Xpert SA Assay (FDA approved for NASAL specimens in patients 21 years of age and older), is one component of Minnie Legros comprehensive surveillance program. It is not intended to diagnose infection nor to guide or monitor treatment. Performed at [facility], [place] [address] [place], [place] [address]  Radiology Studies: No results found.      Scheduled Meds:  allopurinol  100 mg Oral Daily   aspirin EC  81 mg Oral Daily   atorvastatin  40 mg Oral Daily   [START ON 12/22/2022] Chlorhexidine  Gluconate Cloth  6 each Topical Q0600   cycloSPORINE  1 drop Both Eyes BID   darbepoetin (ARANESP) injection - DIALYSIS  200 mcg Subcutaneous Q Fri-1800   gabapentin  300 mg Oral QID   Gerhardt's butt cream   Topical BID   heparin  5,000 Units Subcutaneous Q8H   isosorbide dinitrate  5 mg Oral BID   leptospermum manuka honey  1 Application Topical Daily   melatonin  4.5 mg Oral QHS   mupirocin ointment  1 Application Nasal BID   polyethylene glycol  17 g Oral Daily   senna-docusate  1 tablet Oral BID   sodium chloride flush  3 mL Intravenous Q12H   sodium chloride flush  3 mL Intravenous Q12H   Continuous Infusions:  sodium chloride       LOS: 8 days    Time spent: over 30 min    Fayrene Helper, MD [facility]   To contact the attending provider between 7A-7P or the covering provider during after hours 7P-7A, please log into the web site [email or web address] and access using universal [place] password for that web site. If you do not have the password, please call the hospital operator.  12/21/2022, 5:57 PM

## 2022-12-21 NOTE — Progress Notes (Signed)
2 Days Post-Op   Subjective/Chief Complaint: Pt feels better less pain   Objective: Vital signs in last 24 hours: Temp:  [97.8 F (36.6 C)-98.3 F (36.8 C)] 98.3 F (36.8 C) (12/30 0749) Pulse Rate:  [56-74] 64 (12/30 0749) Resp:  [17-19] 18 (12/30 0749) BP: (103-148)/(53-81) 122/63 (12/30 0749) SpO2:  [98 %-100 %] 100 % (12/30 0749) Weight:  [86.6 kg] 86.6 kg (12/29 1057) Last BM Date : 12/12/22  Intake/Output from previous day: 12/29 0701 - 12/30 0700 In: 117 [P.O.:117] Out: 102.5 [Urine:100] Intake/Output this shift: No intake/output data recorded.  Abd: soft ND NT port sites intact   Lab Results:  Recent Labs    12/20/22 0424 12/21/22 0145  WBC 10.6* 8.5  HGB 8.1* 7.7*  HCT 26.4* 24.8*  PLT 196 206   BMET Recent Labs    12/20/22 0424 12/21/22 0145  NA 136 137  K 5.3* 3.6  CL 102 98  CO2 25 28  GLUCOSE 110* 82  BUN 20 11  CREATININE 3.55* 2.54*  CALCIUM 9.2 8.8*   PT/INR No results for input(s): "LABPROT", "INR" in the last 72 hours. ABG No results for input(s): "PHART", "HCO3" in the last 72 hours.  Invalid input(s): "PCO2", "PO2"  Studies/Results: No results found.  Anti-infectives: Anti-infectives (From admission, onward)    Start     Dose/Rate Route Frequency Ordered Stop   12/13/22 0600  cefoTEtan (CEFOTAN) 2 g in sodium chloride 0.9 % 100 mL IVPB  Status:  Discontinued        2 g 200 mL/hr over 30 Minutes Intravenous On call to O.R. 12/12/22 1631 12/14/22 0559   12/12/22 0615  cefoTEtan (CEFOTAN) 2 g in sodium chloride 0.9 % 100 mL IVPB  Status:  Discontinued        2 g 200 mL/hr over 30 Minutes Intravenous On call to O.R. 12/12/22 2174 12/12/22 0913       Assessment/Plan: s/p Procedure(s): LAPAROSCOPIC CHOLECYSTECTOMY (N/A) [place] for DC when medically ready  Follow up in chart   LOS: 8 days    Turner Daniels MD  12/21/2022

## 2022-12-22 DIAGNOSIS — K811 Chronic cholecystitis: Secondary | ICD-10-CM | POA: Diagnosis not present

## 2022-12-22 DIAGNOSIS — E1122 Type 2 diabetes mellitus with diabetic chronic kidney disease: Secondary | ICD-10-CM | POA: Diagnosis not present

## 2022-12-22 DIAGNOSIS — Z992 Dependence on renal dialysis: Secondary | ICD-10-CM | POA: Diagnosis not present

## 2022-12-22 DIAGNOSIS — N186 End stage renal disease: Secondary | ICD-10-CM | POA: Diagnosis not present

## 2022-12-22 LAB — COMPREHENSIVE METABOLIC PANEL
ALT: <5 U/L (ref 0–44)
AST: 21 U/L (ref 15–41)
Albumin: 2.2 g/dL — ABNORMAL LOW (ref 3.5–5.0)
Alkaline Phosphatase: 47 U/L (ref 38–126)
Anion gap: 10 (ref 5–15)
BUN: 16 mg/dL (ref 8–23)
CO2: 25 mmol/L (ref 22–32)
Calcium: 8.9 mg/dL (ref 8.9–10.3)
Chloride: 100 mmol/L (ref 98–111)
Creatinine, Ser: 3.3 mg/dL — ABNORMAL HIGH (ref 0.61–1.24)
GFR, Estimated: 19 mL/min — ABNORMAL LOW (ref >60)
Glucose, Bld: 105 mg/dL — ABNORMAL HIGH (ref 70–99)
Potassium: 3.4 mmol/L — ABNORMAL LOW (ref 3.5–5.1)
Sodium: 135 mmol/L (ref 135–145)
Total Bilirubin: <0.1 mg/dL — ABNORMAL LOW (ref 0.3–1.2)
Total Protein: 6.5 g/dL (ref 6.5–8.1)

## 2022-12-22 LAB — CBC
HCT: 26.2 % — ABNORMAL LOW (ref 39.0–52.0)
Hemoglobin: 8.1 g/dL — ABNORMAL LOW (ref 13.0–17.0)
MCH: 31.8 pg (ref 26.0–34.0)
MCHC: 30.9 g/dL (ref 30.0–36.0)
MCV: 102.7 fL — ABNORMAL HIGH (ref 80.0–100.0)
Platelets: 224 10*3/uL (ref 150–400)
RBC: 2.55 MIL/uL — ABNORMAL LOW (ref 4.22–5.81)
RDW: 18 % — ABNORMAL HIGH (ref 11.5–15.5)
WBC: 8.4 10*3/uL (ref 4.0–10.5)
nRBC: 0 % (ref 0.0–0.2)

## 2022-12-22 LAB — GLUCOSE, CAPILLARY
Glucose-Capillary: 93 mg/dL (ref 70–99)
Glucose-Capillary: 106 mg/dL — ABNORMAL HIGH (ref 70–99)
Glucose-Capillary: 95 mg/dL (ref 70–99)
Glucose-Capillary: 133 mg/dL — ABNORMAL HIGH (ref 70–99)

## 2022-12-22 LAB — IRON AND TIBC
Iron: 20 ug/dL — ABNORMAL LOW (ref 45–182)
Saturation Ratios: 19 % (ref 17.9–39.5)
TIBC: 105 ug/dL — ABNORMAL LOW (ref 250–450)
UIBC: 85 ug/dL

## 2022-12-22 LAB — PHOSPHORUS: Phosphorus: 2.9 mg/dL (ref 2.5–4.6)

## 2022-12-22 LAB — MAGNESIUM: Magnesium: 1.6 mg/dL — ABNORMAL LOW (ref 1.7–2.4)

## 2022-12-22 LAB — FERRITIN: Ferritin: 1139 ng/mL — ABNORMAL HIGH (ref 24–336)

## 2022-12-22 MED ORDER — MAGNESIUM OXIDE -MG SUPPLEMENT 400 (240 MG) MG PO TABS: 400.0000 mg | ORAL_TABLET | Freq: Every day | ORAL | Status: DC

## 2022-12-22 MED ORDER — CHLORHEXIDINE GLUCONATE CLOTH 2 % EX PADS
6.0000 | MEDICATED_PAD | Freq: Every day | CUTANEOUS | Status: DC
  Administered 2022-12-22 – 2023-01-21 (×31): 6 via TOPICAL

## 2022-12-22 NOTE — Progress Notes (Signed)
Mobility Specialist Progress Note:   12/22/22 0954  Mobility  Activity Dangled on edge of bed  Level of Assistance Moderate assist, patient does 50-74%  Assistive Device None  Activity Response Tolerated poorly  $Mobility charge 1 Mobility   Pt in bed willing to participate in mobility. ModA to sit EOB. Upon sitting pt stated he was dizzy (BP 128/77). Pt unable to remain sitting and asked to lay back down. ModA to lay back down. Left in bed with call bell in reach and all needs met.   [name] [name] Mobility Specialist Please contact via [employer] or  Rehab Office at [phone]

## 2022-12-22 NOTE — TOC Progression Note (Signed)
Transition of Care [facility]) - Progression Note    Patient Details  Name: VERNE LANUZA MRN: [id number] Date of Birth: Oct 27, 1946  Transition of Care [facility]) CM/SW Contact  Emeterio Reeve, [place] Phone Number: 12/22/2022, 12:22 PM  Clinical Narrative:     CSW met with pt at bedside and gave ben offers. Pt chose [facility] for SNF. CSW reached out to admissions coordinator and waiting on call back.  Expected Discharge Plan: [place] Barriers to Discharge: Continued Medical Work up  Expected Discharge Plan and Services In-house Referral: Clinical Social Work   Post Acute Care Choice: [place] Living arrangements for the past 2 months: [place] Determinants of Health (SDOH) Interventions [place]: No Food Insecurity (09/13/2022)  Housing: Low Risk  (12/17/2022)  Transportation Needs: No Transportation Needs (12/17/2022)  Utilities: Not At Risk (12/17/2022)  Tobacco Use: Medium Risk (12/20/2022)    Readmission Risk Interventions    09/16/2022   12:31 PM 08/02/2022    2:23 PM  Readmission Risk Prevention Plan  Transportation Screening Complete Complete  Medication Review Press photographer) Complete Complete  HRI or [place] Complete Complete  SW Recovery Care/Counseling Consult Complete Complete  Palliative Care Screening Not Applicable Not Applicable  [place] Not Applicable Complete   Emeterio Reeve, [place] Clinical Social Worker

## 2022-12-22 NOTE — Progress Notes (Signed)
[name] Progress Note   Subjective:   Planned for HD today instead on Monday due to holiday schedule. Reports ongoing abdominal pain. Denies SOB, CP, dizziness.   Objective Vitals:   12/21/22 0749 12/21/22 2023 12/22/22 0020 12/22/22 0451  BP: 122/63 (!) 105/54 105/67 (!) 137/59  Pulse: 64   79  Resp: 18 19 20 16   Temp: 98.3 F (36.8 C) 98.7 F (37.1 C) 98.5 F (36.9 C) 98.6 F (37 C)  TempSrc: Oral Oral Oral Oral  SpO2: 100% 100% 99% 100%  Weight:      Height:       Physical Exam General: Alert male in NAD Heart: RRR, no murmurs, rubs or gallops Lungs: CTA bilaterally, no wheezing, rhonchi or rales Abdomen: Soft, non-distended, +BS Extremities: No edema b/l lower extremities Dialysis Access:  R IJ [name]  Additional Objective Labs: Basic Metabolic Panel: Recent Labs  Lab 12/20/22 0424 12/21/22 0145 12/22/22 0122  NA 136 137 135  K 5.3* 3.6 3.4*  CL 102 98 100  CO2 25 28 25   GLUCOSE 110* 82 105*  BUN 20 11 16   CREATININE 3.55* 2.54* 3.30*  CALCIUM 9.2 8.8* 8.9  PHOS 4.4 3.1 2.9   Liver Function Tests: Recent Labs  Lab 12/20/22 0424 12/21/22 0145 12/22/22 0122  AST 42*  --  21  ALT 17  --  <5  ALKPHOS 52  --  47  BILITOT 0.4  --  <0.1*  PROT 7.0  --  6.5  ALBUMIN 2.3* 2.1* 2.2*   No results for input(s): "LIPASE", "AMYLASE" in the last 168 hours. CBC: Recent Labs  Lab 12/18/22 0431 12/19/22 0437 12/19/22 1815 12/20/22 0424 12/21/22 0145 12/22/22 0122  WBC 9.1 6.9  --  10.6* 8.5 8.4  NEUTROABS 5.7 3.8  --  8.7* 5.2  --   HGB 8.1* 8.2*   < > 8.1* 7.7* 8.1*  HCT 25.3* 26.6*   < > 26.4* 24.8* 26.2*  MCV 100.4* 102.7*  --  103.5* 102.1* 102.7*  PLT 206 188  --  196 206 224   < > = values in this interval not displayed.   Blood Culture    Component Value Date/Time   SDES  09/15/2022 0743    BLOOD LEFT HAND BOTTLES DRAWN AEROBIC AND ANAEROBIC   SPECREQUEST  09/15/2022 0743    Blood Culture results may not be optimal due to  an excessive volume of blood received in culture bottles   CULT  09/15/2022 0743    NO GROWTH 5 DAYS Performed at [facility], [address], [place], [place] [address]    REPTSTATUS 09/20/2022 FINAL 09/15/2022 0743    Cardiac Enzymes: No results for input(s): "CKTOTAL", "CKMB", "CKMBINDEX", "TROPONINI" in the last 168 hours. CBG: Recent Labs  Lab 12/21/22 0747 12/21/22 1209 12/21/22 1606 12/21/22 2126 12/22/22 0749  GLUCAP 78 97 121* 121* 93   Iron Studies:  Recent Labs    12/22/22 0122  IRON 20*  TIBC 105*  FERRITIN 1,139*   @lablastinr3 @ Studies/Results: No results found. Medications:  sodium chloride      allopurinol  100 mg Oral Daily   aspirin EC  81 mg Oral Daily   atorvastatin  40 mg Oral Daily   Chlorhexidine Gluconate Cloth  6 each Topical Q0600   cycloSPORINE  1 drop Both Eyes BID   darbepoetin (ARANESP) injection - DIALYSIS  200 mcg Subcutaneous Q Fri-1800   gabapentin  300 mg Oral QID   Gerhardt's butt cream  Topical BID   heparin  5,000 Units Subcutaneous Q8H   isosorbide dinitrate  5 mg Oral BID   melatonin  4.5 mg Oral QHS   polyethylene glycol  17 g Oral Daily   senna-docusate  1 tablet Oral BID   sodium chloride flush  3 mL Intravenous Q12H   sodium chloride flush  3 mL Intravenous Q12H    Dialysis Orders: [facility] MWF 4h  400/800  84.5kg  2/2.5 bath   Heparin 4000 units IV  RIJ TDC - last OP HD 12/20, post wt 85.9kg - last IP hD 12/24, post wt 85kg - mircera 225 mcg IV q2, last 12/18, due 1/02 - no vdra  Assessment/Plan: Chronic cholecystitis - w/ nausea, malnutrition, FTT.  S/p laparoscopic cholecystectomy  Chronic HFrEF - LVEF up to 50-55% here, last was 35-40%. LHC done 12/17/2022. Moderate diffuse CAD. No intervention needed per cardiology. Treating medically.  ESRD - on HD MWF. Tolerating HD well. HD unit is on a holiday schedule this week. Patient will have dialysis Sunday (today), Wednesday and Friday.  K+ 3.4, will use added K+  bath today.  HTN/ volume -BP stable. No evidence of excess volume by exam. UF as tolerated. Anemia esrd - Hb 8.1, down from 10.5 12/17/2022. Aranesp given 12/29. Checking iron panel.  MBD ckd - CCa and phos controlled, not on a binder DM2 - per pmd      Anice Paganini, PA-C 12/22/2022, 8:43 AM  [name] Pager: [phone]

## 2022-12-22 NOTE — Progress Notes (Signed)
No acute surgical issues  May discharge when medically stable

## 2022-12-22 NOTE — Progress Notes (Signed)
Received patient in bed to unit.  Alert and oriented.  Informed consent signed and in chart.   Treatment initiated: 12:20 Treatment completed: 15:30  Patient tolerated well.  Transported back to the room  Alert, without acute distress.  Hand-off given to patient's nurse.   Access used: right hemodialysis catheter Access issues: none  Total UF removed: 1L Medication(s) given: none      12/22/22 1530  Vitals  Temp 98.2 F (36.8 C)  Temp Source Oral  BP (!) 115/56  MAP (mmHg) 75  BP Location Left Arm  BP Method Automatic  Patient Position (if appropriate) Lying  Pulse Rate 83  Pulse Rate Source Monitor  ECG Heart Rate 73  Resp (!) 24  Oxygen Therapy  SpO2 100 %  O2 Device Room Air  During Treatment Monitoring  Blood Flow Rate (mL/min) 200 mL/min  Intra-Hemodialysis Comments Tolerated well;Tx completed  Dialysis Fluid Bolus Normal Saline  Bolus Amount (mL) 300 mL  Post Treatment  Dialyzer Clearance Lightly streaked  Duration of HD Treatment -hour(s) 3 hour(s)  Hemodialysis Intake (mL) 0 mL  Liters Processed 51.3  Fluid Removed (mL) 1000 mL  Tolerated HD Treatment Yes  Hemodialysis Catheter Right Subclavian  Placement Date: 08/06/22   Placed prior to admission: Yes  Orientation: Right  Access Location: Subclavian  Site Condition No complications  Blue Lumen Status Blood return noted;Flushed  Red Lumen Status Blood return noted;Flushed  Purple Lumen Status N/A    Brandie Lopes S [facility]

## 2022-12-22 NOTE — Progress Notes (Signed)
PROGRESS NOTE    [name]  KLK:[id number] DOB: [birth year] DOA: 12/12/2022 PCP: Redmond School, MD  No chief complaint on file.  Brief Narrative:  TAKAHIRO GODINHO is Ziere Docken 76 y.o. male with PMH ESRD on MWF HD, HFrEF, T2DM, HTN who presented with nausea. He has Rondall Radigan known history of cholecystitis and had undergone cholecystostomy tube placement.  His tube had become dislodged and he underwent evaluation with surgery.  He was noted to have recanalization of his bile duct and therefore the tube was not replaced. He presented to the hospital for elective cholecystectomy and underwent preop evaluation with cardiology.  His workup was notable for diffuse T wave inversions and prolonged QTc.  He was also noted to be significantly hypokalemic.  He was started on electrolyte replacement and admitted for further workup and monitoring. Nephrology was also consulted to continue his chronic HD during hospitalization. Despite improved metabolic parameters, ECG changes persisted, so he was transferred to [facility] for cardiac catheterization on 12/26.   Assessment & Plan:   Principal Problem:   Chronic cholecystitis Active Problems:   ESRD (end stage renal disease) ([place])   Coronary artery disease involving native coronary artery of native heart without angina pectoris   Hypokalemia   Diabetes mellitus type 2 in obese (HCC)   Anemia, normocytic normochromic   Essential hypertension   Heart failure with improved ejection fraction (HFimpEF) (HCC)   HFrEF (heart failure with reduced ejection fraction) (HCC)   Nausea and vomiting   Pressure injury of skin of sacral region   Prolonged QT interval   Generalized weakness   Abnormal EKG   Preoperative cardiovascular examination  Chronic cholecystitis: Causing failure to thrive, malnutrition.  -now s/p cholecystectomy 12/28 with intraoperative ICG cholangiography -per gen surgery, stable for d/c when medically stable   Chronic HFrEF, abnormal ECG: Repeat echo  this admission with LVEF up to 50-55% from 35-40% in August 2023.  - Manage volume with HD - Cotninue cardiac monitoring - LHC 12/26 with multivessel diffuse moderate CAD with no acute or obvious flow limiting lesions (in absence of any active anginal symptoms, would not PCI in preop setting)  - recommending medical therapy, monitor for symptoms, ok to proceed with operation - high dose statin - Cardiology following patient   ESRD: Last HD off schedule early 12/24. Typically MWF.  - Nephrology consulted for HD.    Anemia of ESRD:  - ESA per nephrology - appears to have received 1 unit pRBC during this hospitalization    Hypokalemia: Resolved.    T2DM:  A1c 4.4 (likely reflecting his recent failure to thrive, poor PO intake) D/c SSI   Functional Quadriplegia  Essentially bedbound for the past year (needs help toileting, turning in bed, dressing, etc) Not quite clear to me the cause Follow outpatient  Unstageable sacral pressure injury, POA: Uninfected appearing.  Offload, local wound care Pressure Injury 11/06/22 Sacrum Mid Unstageable - Full thickness tissue loss in which the base of the injury is covered by slough (yellow, tan, gray, green or brown) and/or eschar (tan, brown or black) in the wound bed. reddended area to the [place] with (Active)  11/06/22 1315  Location: Sacrum  Location Orientation: Mid  Staging: Unstageable - Full thickness tissue loss in which the base of the injury is covered by slough (yellow, tan, gray, green or brown) and/or eschar (tan, brown or black) in the wound bed.  Wound Description (Comments): reddended area to the sacruum with yellow eschar and foul smell  Present  on Admission: Yes      DVT prophylaxis: heparin Code Status: full Family Communication: none Disposition:   Status is: Inpatient Remains inpatient appropriate because: working on SNF   Consultants:  Renal Surgery cardiology  Procedures:  12/28  Laparoscopic  Cholecystectomy with intraoperative ICG cholangiography      LHC   Prox RCA-1 lesion is 50% stenosed. Prox RCA-2 lesion is 20% stenosed.   Mid RCA to Dist RCA lesion is 65% stenosed.   RPDA lesion is 60% stenosed.   RPAV lesion is 15% stenosed.  2nd RPL lesion is 30% stenosed.   Dist LAD-1 lesion is 55% stenosed. Dist/apical LAD-2 lesion is 75% stenosed.   Dist/apical LAD-2 lesion is 75% stenosed.   Prox 3rd Mrg is 40% stenosed   LV end diastolic pressure is normal.   POST CATH DIAGNOSES Mulitvessel diffuse Moderate CAD with no acute or obvious Flow-Limiting lesions. Prox RCA eccentril 50-55%, distal RCA eccentric calcified shelf lesion ~60-65% & ostial rPDA 60% --> In tandem could potentialy be RFR/FFR positive. However, in the absence of any ongoing active anginal symptoms, would not PCI in the preop setting. Early distal LAD eccentric 55 to 60% and apical 70% stenosed this cannot amenable for PCI. Otherwise diffuse mild to moderate disease in the LCx Normal LVEDP-normal EF by Echo      RECOMMENDATIONS Aggressive to direct medical therapy for moderate diffuse CAD.  Monitor for symptoms.  But for now would simply treat medically. Would proceed with planned operation without further cardiac evaluation given nonobstructive CAD and normal echo.   Echo IMPRESSIONS     1. Limited Echo to evaluate LVEF   2. Left ventricular ejection fraction, by estimation, is 50 to 55%. The  left ventricle has low normal function. The left ventricle has no regional  wall motion abnormalities. Left ventricular diastolic function could not  be evaluated.   3. The mitral valve is grossly normal. Mild mitral valve regurgitation.  No evidence of mitral stenosis.   4. The inferior vena cava is normal in size with greater than 50%  respiratory variability, suggesting right atrial pressure of 3 mmHg.   Comparison(s): Changes from prior study are noted. LVEF improved from  35-40% in 07/2022.    Antimicrobials:  Anti-infectives (From admission, onward)    Start     Dose/Rate Route Frequency Ordered Stop   12/13/22 0600  cefoTEtan (CEFOTAN) 2 g in sodium chloride 0.9 % 100 mL IVPB  Status:  Discontinued        2 g 200 mL/hr over 30 Minutes Intravenous On call to O.R. 12/12/22 1631 12/14/22 0559   12/12/22 0615  cefoTEtan (CEFOTAN) 2 g in sodium chloride 0.9 % 100 mL IVPB  Status:  Discontinued        2 g 200 mL/hr over 30 Minutes Intravenous On call to O.R. 12/12/22 0614 12/12/22 0913       Subjective: Continued c/o abdominal discomfort   Objective: Vitals:   12/20/22 1336 12/20/22 2047 12/21/22 0426 12/21/22 0749  BP: (!) 123/57 (!) 103/53 (!) 131/59 122/63  Pulse:  72 71 64  Resp:  18 18 18   Temp:  98.1 F (36.7 C) 98 F (36.7 C) 98.3 F (36.8 C)  TempSrc:  Oral Oral Oral  SpO2:  99% 100% 100%  Weight:      Height:        Intake/Output Summary (Last 24 hours) at 12/21/2022 1757 Last data filed at 12/21/2022 0430 Gross per 24 hour  Intake --  Output 100 ml  Net -100 ml   Filed Weights   12/20/22 0443 12/20/22 0743 12/20/22 1057  Weight: 86.9 kg 89.1 kg 86.6 kg    Examination:  General: No acute distress. Cardiovascular: RRR Lungs: unlabored Abdomen: appropriately TTP, seems improved though he mentions continued pain Neurological: Alert and oriented 3. Moves all extremities 4. Cranial nerves II through XII grossly intact. Skin: unstageable pressure ulcer not examined Extremities: No clubbing or cyanosis. No edema.  Data Reviewed: I have personally reviewed following labs and imaging studies  CBC: Recent Labs  Lab 12/17/22 0404 12/17/22 1850 12/18/22 0431 12/19/22 0437 12/19/22 1815 12/20/22 0424 12/21/22 0145  WBC 8.0 7.4 9.1 6.9  --  10.6* 8.5  NEUTROABS 4.7  --  5.7 3.8  --  8.7* 5.2  HGB 10.5* 8.7* 8.1* 8.2* 8.4* 8.1* 7.7*  HCT 33.7* 28.4* 25.3* 26.6* 25.7* 26.4* 24.8*  MCV 102.4* 104.0* 100.4* 102.7*  --  103.5* 102.1*  PLT 203  168 206 188  --  196 588    Basic Metabolic Panel: Recent Labs  Lab 12/17/22 0404 12/17/22 1850 12/17/22 1912 12/18/22 0431 12/19/22 0437 12/20/22 0424 12/21/22 0145  NA  --   --  139 139 135 136 137  K  --   --  4.9 4.9 4.2 5.3* 3.6  CL  --   --  106 106 98 102 98  CO2  --   --  26 28 29 25 28   GLUCOSE  --   --  104* 87 81 110* 82  BUN  --   --  17 19 12 20 11   CREATININE  --    < > 3.34* 3.71* 2.55* 3.55* 2.54*  CALCIUM  --   --  9.2 9.2 8.9 9.2 8.8*  MG 2.0  --   --  1.9 1.6* 1.7 1.6*  PHOS  --   --  3.6 3.6 3.1 4.4 3.1   < > = values in this interval not displayed.    GFR: Estimated Creatinine Clearance: 29.1 mL/min (Charniece Venturino) (by C-G formula based on SCr of 2.54 mg/dL (H)).  Liver Function Tests: Recent Labs  Lab 12/17/22 1912 12/18/22 0431 12/19/22 0437 12/20/22 0424 12/21/22 0145  AST  --   --   --  42*  --   ALT  --   --   --  17  --   ALKPHOS  --   --   --  52  --   BILITOT  --   --   --  0.4  --   PROT  --   --   --  7.0  --   ALBUMIN 2.1* 1.9* 2.0* 2.3* 2.1*    CBG: Recent Labs  Lab 12/20/22 1628 12/20/22 2150 12/21/22 0747 12/21/22 1209 12/21/22 1606  GLUCAP 114* 73 78 97 121*     Recent Results (from the past 240 hour(s))  Surgical PCR screen     Status: None   Collection Time: 12/17/22  2:09 AM   Specimen: Nasal Mucosa; Nasal Swab  Result Value Ref Range Status   MRSA, PCR NEGATIVE NEGATIVE Final   Staphylococcus aureus NEGATIVE NEGATIVE Final    Comment: (NOTE) The Xpert SA Assay (FDA approved for NASAL specimens in patients 75 years of age and older), is one component of Jamarl Pew comprehensive surveillance program. It is not intended to diagnose infection nor to guide or monitor treatment. Performed at [facility], [place] [address], [place], [place] [address]  Radiology Studies: No results found.      Scheduled Meds:  allopurinol  100 mg Oral Daily   aspirin EC  81 mg Oral Daily   atorvastatin  40 mg Oral Daily    [START ON 12/22/2022] Chlorhexidine Gluconate Cloth  6 each Topical Q0600   cycloSPORINE  1 drop Both Eyes BID   darbepoetin (ARANESP) injection - DIALYSIS  200 mcg Subcutaneous Q Fri-1800   gabapentin  300 mg Oral QID   Gerhardt's butt cream   Topical BID   heparin  5,000 Units Subcutaneous Q8H   isosorbide dinitrate  5 mg Oral BID   leptospermum manuka honey  1 Application Topical Daily   melatonin  4.5 mg Oral QHS   mupirocin ointment  1 Application Nasal BID   polyethylene glycol  17 g Oral Daily   senna-docusate  1 tablet Oral BID   sodium chloride flush  3 mL Intravenous Q12H   sodium chloride flush  3 mL Intravenous Q12H   Continuous Infusions:  sodium chloride       LOS: 8 days    Time spent: over 30 min    Fayrene Helper, MD [facility]   To contact the attending provider between 7A-7P or the covering provider during after hours 7P-7A, please log into the web site [email or web address] and access using universal [place] password for that web site. If you do not have the password, please call the hospital operator.  12/21/2022, 5:57 PM

## 2022-12-22 NOTE — Progress Notes (Signed)
Pt arrived back from HD around 1600. BP was 117/66. Called pharmacy to ask about giving 1000 dose of isosorbide and was told to hold the 1000 dose and move the 2200 dose up to 2000 due to BP still being on the softer side after HD.

## 2022-12-23 DIAGNOSIS — K811 Chronic cholecystitis: Secondary | ICD-10-CM | POA: Diagnosis not present

## 2022-12-23 LAB — RENAL FUNCTION PANEL
Albumin: 2.2 g/dL — ABNORMAL LOW (ref 3.5–5.0)
Anion gap: 10 (ref 5–15)
BUN: 11 mg/dL (ref 8–23)
CO2: 26 mmol/L (ref 22–32)
Calcium: 8.6 mg/dL — ABNORMAL LOW (ref 8.9–10.3)
Chloride: 97 mmol/L — ABNORMAL LOW (ref 98–111)
Creatinine, Ser: 2.35 mg/dL — ABNORMAL HIGH (ref 0.61–1.24)
GFR, Estimated: 28 mL/min — ABNORMAL LOW (ref >60)
Glucose, Bld: 120 mg/dL — ABNORMAL HIGH (ref 70–99)
Phosphorus: 2.1 mg/dL — ABNORMAL LOW (ref 2.5–4.6)
Potassium: 3.4 mmol/L — ABNORMAL LOW (ref 3.5–5.1)
Sodium: 133 mmol/L — ABNORMAL LOW (ref 135–145)

## 2022-12-23 LAB — TYPE AND SCREEN
ABO/RH(D): A POS
Antibody Screen: NEGATIVE
Donor AG Type: NEGATIVE
Donor AG Type: NEGATIVE
Unit division: 0
Unit division: 0

## 2022-12-23 LAB — BPAM RBC
Blood Product Expiration Date: 202401152359
Blood Product Expiration Date: 202401172359
ISSUE DATE / TIME: 202312261103
Unit Type and Rh: 6200
Unit Type and Rh: 6200

## 2022-12-23 LAB — CBC
HCT: 26.2 % — ABNORMAL LOW (ref 39.0–52.0)
Hemoglobin: 8.1 g/dL — ABNORMAL LOW (ref 13.0–17.0)
MCH: 31.6 pg (ref 26.0–34.0)
MCHC: 30.9 g/dL (ref 30.0–36.0)
MCV: 102.3 fL — ABNORMAL HIGH (ref 80.0–100.0)
Platelets: 236 10*3/uL (ref 150–400)
RBC: 2.56 MIL/uL — ABNORMAL LOW (ref 4.22–5.81)
RDW: 17.8 % — ABNORMAL HIGH (ref 11.5–15.5)
WBC: 8.3 10*3/uL (ref 4.0–10.5)
nRBC: 0 % (ref 0.0–0.2)

## 2022-12-23 LAB — GLUCOSE, CAPILLARY: Glucose-Capillary: 106 mg/dL — ABNORMAL HIGH (ref 70–99)

## 2022-12-23 NOTE — TOC Progression Note (Addendum)
Transition of Care [facility]) - Progression Note    Patient Details  Name: [name] MRN: [id number] Date of Birth: 21-Jul-1946  Transition of Care [facility]) CM/SW [place], [place] Phone Number: 12/23/2022, 8:39 AM  Clinical Narrative:     Update- Patients insurance requested updated PT note. CSW submitted patients insurance current PT note. Insurance auth pending.  CSW spoke with Debbie with [facility] who confirmed SNF bed for patient. Debbie confirmed she has patients HD information.CSW started insurance authorization for patient [place] ID# [id number].CSW requested updated clinicals from PT/OT for patients insurance.CSW will continue to follow and assist with patients dc planning needs.  Expected Discharge Plan: [place] Barriers to Discharge: Continued Medical Work up  Expected Discharge Plan and Services In-house Referral: Clinical Social Work   Post Acute Care Choice: [place] Living arrangements for the past 2 months: [place] Determinants of Health (SDOH) Interventions SDOH Screenings   Food Insecurity: No Food Insecurity (09/13/2022)  Housing: Low Risk  (12/17/2022)  Transportation Needs: No Transportation Needs (12/17/2022)  Utilities: Not At Risk (12/17/2022)  Tobacco Use: Medium Risk (12/20/2022)    Readmission Risk Interventions    09/16/2022   12:31 PM 08/02/2022    2:23 PM  Readmission Risk Prevention Plan  Transportation Screening Complete Complete  Medication Review (RN Care Manager) Complete Complete  HRI or Home Care Consult Complete Complete  SW Recovery Care/Counseling Consult Complete Complete  Palliative Care Screening Not Applicable Not Applicable  Skilled Nursing Facility Not Applicable Complete

## 2022-12-23 NOTE — Progress Notes (Signed)
Physical Therapy Treatment Patient Details Name: [name] MRN: [id number] DOB: 01-04-1946 Today's Date: 12/23/2022   History of Present Illness 77 yo male admitted to [facility] 12/21 for cholecystectomy which was canceled due to hypokalemia and prolonged Qtc. Transfer to [facility] 12/26 with cardiac cath performed same date. 12/28 lap chole. PMHx: ESRD on HD MWF, HFrEF, DM, HTN, anxiety, HLD, Bil TKA, gout, GERD    PT Comments    Pt with significant decline in function from evaluation. Pt currently requiring mod-max assist simply to sit EOB, unable to stand or scoot and was walking household distance PTA. Pt reports limitations in dizziness and pain due to sacral wound. Pt would benefit from lift OOB to chair as well as continued therapy progression acutely with goals downgraded.     Recommendations for follow up therapy are one component of a multi-disciplinary discharge planning process, led by the attending physician.  Recommendations may be updated based on patient status, additional functional criteria and insurance authorization.  Follow Up Recommendations  Skilled nursing-short term rehab (<3 hours/day) Can patient physically be transported by private vehicle: No   Assistance Recommended at Discharge Frequent or constant Supervision/Assistance  Patient can return home with the following Two people to help with walking and/or transfers;Two people to help with bathing/dressing/bathroom;Assistance with cooking/housework;Assistance with feeding;Assist for transportation;Direct supervision/assist for medications management;Direct supervision/assist for financial management   Equipment Recommendations  Hospital bed;Wheelchair (measurements PT) Product manager)    Recommendations for Other Services       Precautions / Restrictions Precautions Precautions: Fall;Other (comment) Precaution Comments: sacral wound Restrictions Weight Bearing Restrictions: No     Mobility  Bed Mobility Overal bed  mobility: Needs Assistance Bed Mobility: Supine to Sit, Sit to Supine     Supine to sit: Mod assist, HOB elevated Sit to supine: Max assist   General bed mobility comments: mod assist to initiate movement of bil LE off of bed and assist to elevate trunk from surface with HOB 35 degrees. Pt unable to assist with scooting to EOB and too heavy to perform with +1 assist. Max assist to bring legs back to surface to return to supine. Min assist to slide toward [facility] in trendelenburg with pt assisting with bil UE on headboard. Mod with progression to min assist with sitting EOB pt limited by dizziness and fatigue after only 4 min EOB    Transfers                   General transfer comment: unable without +2 assist    Ambulation/Gait                   Stairs             Wheelchair Mobility    Modified Rankin (Stroke Patients Only)       Balance Overall balance assessment: Needs assistance Sitting-balance support: Bilateral upper extremity supported, Feet supported Sitting balance-Leahy Scale: Zero Sitting balance - Comments: posterior right bias with mod assist for sitting balance Postural control: Posterior lean, Right lateral lean                                  Cognition Arousal/Alertness: Awake/alert Behavior During Therapy: Flat affect Overall Cognitive Status: Impaired/Different from baseline Area of Impairment: Safety/judgement, Problem solving, Awareness  Safety/Judgement: Decreased awareness of deficits, Decreased awareness of safety Awareness: Intellectual Problem Solving: Slow processing          Exercises      General Comments        Pertinent Vitals/Pain Pain Assessment Faces Pain Scale: Hurts little more Pain Location: sacrum Pain Descriptors / Indicators: Aching, Sore Pain Intervention(s): Limited activity within patient's tolerance, Repositioned, Monitored during session    Home  Living                          Prior Function            PT Goals (current goals can now be found in the care plan section) Acute Rehab PT Goals Time For Goal Achievement: 01/06/23 Potential to Achieve Goals: Fair Progress towards PT goals: Not progressing toward goals - comment;Goals downgraded-see care plan    Frequency    Min 2X/week      PT Plan Current plan remains appropriate;Frequency needs to be updated    Co-evaluation              AM-PAC PT "6 Clicks" Mobility   Outcome Measure  Help needed turning from your back to your side while in a flat bed without using bedrails?: A Lot Help needed moving from lying on your back to sitting on the side of a flat bed without using bedrails?: A Lot Help needed moving to and from a bed to a chair (including a wheelchair)?: Total Help needed standing up from a chair using your arms (e.g., wheelchair or bedside chair)?: Total Help needed to walk in hospital room?: Total Help needed climbing 3-5 steps with a railing? : Total 6 Click Score: 8    End of Session   Activity Tolerance: Patient limited by fatigue Patient left: in bed;with call bell/phone within reach;with bed alarm set Nurse Communication: Mobility status;Need for lift equipment PT Visit Diagnosis: Unsteadiness on feet (R26.81);Other abnormalities of gait and mobility (R26.89);Muscle weakness (generalized) (M62.81)     Time: 5102-5852 PT Time Calculation (min) (ACUTE ONLY): 17 min  Charges:  $Therapeutic Activity: 8-22 mins                     Bayard Males, PT Acute Rehabilitation Services Office: [phone]    [name] 12/23/2022, 1:35 PM

## 2022-12-23 NOTE — Progress Notes (Signed)
PROGRESS NOTE    [name]  KLK:[id number] DOB: [birth year] DOA: 12/12/2022 PCP: Redmond School, MD  No chief complaint on file.  Brief Narrative:  [name] is [name] 77 y.o. male with PMH ESRD on MWF HD, HFrEF, T2DM, HTN who presented with nausea. He has [name] known history of cholecystitis and had undergone cholecystostomy tube placement.  His tube had become dislodged and he underwent evaluation with surgery.  He was noted to have recanalization of his bile duct and therefore the tube was not replaced. He presented to the hospital for elective cholecystectomy and underwent preop evaluation with cardiology.  His workup was notable for diffuse T wave inversions and prolonged QTc.  He was also noted to be significantly hypokalemic.  He was started on electrolyte replacement and admitted for further workup and monitoring. Nephrology was also consulted to continue his chronic HD during hospitalization. Despite improved metabolic parameters, ECG changes persisted, so he was transferred to [facility] for cardiac catheterization on 12/26.   Assessment & Plan:   Principal Problem:   Chronic cholecystitis Active Problems:   ESRD (end stage renal disease) ([place])   Coronary artery disease involving native coronary artery of native heart without angina pectoris   Hypokalemia   Diabetes mellitus type 2 in obese (HCC)   Anemia, normocytic normochromic   Essential hypertension   Heart failure with improved ejection fraction (HFimpEF) (HCC)   HFrEF (heart failure with reduced ejection fraction) (HCC)   Nausea and vomiting   Pressure injury of skin of sacral region   Prolonged QT interval   Generalized weakness   Abnormal EKG   Preoperative cardiovascular examination  Chronic cholecystitis: Causing failure to thrive, malnutrition.  -now s/p cholecystectomy 12/28 with intraoperative ICG cholangiography -per gen surgery, stable for d/c when medically stable   Chronic HFrEF, abnormal ECG: Repeat echo  this admission with LVEF up to 50-55% from 35-40% in August 2023.  - Manage volume with HD - Cotninue cardiac monitoring - LHC 12/26 with multivessel diffuse moderate CAD with no acute or obvious flow limiting lesions (in absence of any active anginal symptoms, would not PCI in preop setting)  - recommending medical therapy, monitor for symptoms, ok to proceed with operation - high dose statin - Cardiology following patient   ESRD: Last HD off schedule early 12/24. Typically MWF.  - Nephrology consulted for HD.    Anemia of ESRD:  - ESA per nephrology - appears to have received 1 unit pRBC during this hospitalization    Hypokalemia: Resolved.    T2DM:  A1c 4.4 (likely reflecting his recent failure to thrive, poor PO intake) D/c SSI   Functional Quadriplegia  Essentially bedbound for the past year (needs help toileting, turning in bed, dressing, etc) Not quite clear to me the cause Follow outpatient  Unstageable sacral pressure injury, POA: Uninfected appearing.  Offload, local wound care Pressure Injury 11/06/22 Sacrum Mid Unstageable - Full thickness tissue loss in which the base of the injury is covered by slough (yellow, tan, gray, green or brown) and/or eschar (tan, brown or black) in the wound bed. reddended area to the [place] with (Active)  11/06/22 1315  Location: Sacrum  Location Orientation: Mid  Staging: Unstageable - Full thickness tissue loss in which the base of the injury is covered by slough (yellow, tan, gray, green or brown) and/or eschar (tan, brown or black) in the wound bed.  Wound Description (Comments): reddended area to the sacruum with yellow eschar and foul smell  Present  on Admission: Yes      DVT prophylaxis: heparin Code Status: full Family Communication: none Disposition:   Status is: Inpatient Remains inpatient appropriate because: working on SNF   Consultants:  Renal Surgery cardiology  Procedures:  12/28  Laparoscopic  Cholecystectomy with intraoperative ICG cholangiography      LHC   Prox RCA-1 lesion is 50% stenosed. Prox RCA-2 lesion is 20% stenosed.   Mid RCA to Dist RCA lesion is 65% stenosed.   RPDA lesion is 60% stenosed.   RPAV lesion is 15% stenosed.  2nd RPL lesion is 30% stenosed.   Dist LAD-1 lesion is 55% stenosed. Dist/apical LAD-2 lesion is 75% stenosed.   Dist/apical LAD-2 lesion is 75% stenosed.   Prox 3rd Mrg is 40% stenosed   LV end diastolic pressure is normal.   POST CATH DIAGNOSES Mulitvessel diffuse Moderate CAD with no acute or obvious Flow-Limiting lesions. Prox RCA eccentril 50-55%, distal RCA eccentric calcified shelf lesion ~60-65% & ostial rPDA 60% --> In tandem could potentialy be RFR/FFR positive. However, in the absence of any ongoing active anginal symptoms, would not PCI in the preop setting. Early distal LAD eccentric 55 to 60% and apical 70% stenosed this cannot amenable for PCI. Otherwise diffuse mild to moderate disease in the LCx Normal LVEDP-normal EF by Echo      RECOMMENDATIONS Aggressive to direct medical therapy for moderate diffuse CAD.  Monitor for symptoms.  But for now would simply treat medically. Would proceed with planned operation without further cardiac evaluation given nonobstructive CAD and normal echo.   Echo IMPRESSIONS     1. Limited Echo to evaluate LVEF   2. Left ventricular ejection fraction, by estimation, is 50 to 55%. The  left ventricle has low normal function. The left ventricle has no regional  wall motion abnormalities. Left ventricular diastolic function could not  be evaluated.   3. The mitral valve is grossly normal. Mild mitral valve regurgitation.  No evidence of mitral stenosis.   4. The inferior vena cava is normal in size with greater than 50%  respiratory variability, suggesting right atrial pressure of 3 mmHg.   Comparison(s): Changes from prior study are noted. LVEF improved from  35-40% in 07/2022.    Antimicrobials:  Anti-infectives (From admission, onward)    Start     Dose/Rate Route Frequency Ordered Stop   12/13/22 0600  cefoTEtan (CEFOTAN) 2 g in sodium chloride 0.9 % 100 mL IVPB  Status:  Discontinued        2 g 200 mL/hr over 30 Minutes Intravenous On call to O.R. 12/12/22 1631 12/14/22 0559   12/12/22 0615  cefoTEtan (CEFOTAN) 2 g in sodium chloride 0.9 % 100 mL IVPB  Status:  Discontinued        2 g 200 mL/hr over 30 Minutes Intravenous On call to O.R. 12/12/22 0614 12/12/22 0913       Subjective: Complaining that food is bad  Objective: Vitals:   12/20/22 1336 12/20/22 2047 12/21/22 0426 12/21/22 0749  BP: (!) 123/57 (!) 103/53 (!) 131/59 122/63  Pulse:  72 71 64  Resp:  '18 18 18  '$ Temp:  98.1 F (36.7 C) 98 F (36.7 C) 98.3 F (36.8 C)  TempSrc:  Oral Oral Oral  SpO2:  99% 100% 100%  Weight:      Height:        Intake/Output Summary (Last 24 hours) at 12/21/2022 1757 Last data filed at 12/21/2022 0430 Gross per 24 hour  Intake --  Output 100 ml  Net -100 ml   Filed Weights   12/20/22 0443 12/20/22 0743 12/20/22 1057  Weight: 86.9 kg 89.1 kg 86.6 kg    Examination:  General: No acute distress. Cardiovascular: RRR Lungs: unlabored Abdomen: appropriately tender, improved Neurological: Alert. Moves all extremities 4. Cranial nerves II through XII grossly intact. Extremities: No clubbing or cyanosis. No edema.   Data Reviewed: I have personally reviewed following labs and imaging studies  CBC: Recent Labs  Lab 12/17/22 0404 12/17/22 1850 12/18/22 0431 12/19/22 0437 12/19/22 1815 12/20/22 0424 12/21/22 0145  WBC 8.0 7.4 9.1 6.9  --  10.6* 8.5  NEUTROABS 4.7  --  5.7 3.8  --  8.7* 5.2  HGB 10.5* 8.7* 8.1* 8.2* 8.4* 8.1* 7.7*  HCT 33.7* 28.4* 25.3* 26.6* 25.7* 26.4* 24.8*  MCV 102.4* 104.0* 100.4* 102.7*  --  103.5* 102.1*  PLT 203 168 206 188  --  196 956    Basic Metabolic Panel: Recent Labs  Lab 12/17/22 0404 12/17/22 1850  12/17/22 1912 12/18/22 0431 12/19/22 0437 12/20/22 0424 12/21/22 0145  NA  --   --  139 139 135 136 137  K  --   --  4.9 4.9 4.2 5.3* 3.6  CL  --   --  106 106 98 102 98  CO2  --   --  '26 28 29 25 28  '$ GLUCOSE  --   --  104* 87 81 110* 82  BUN  --   --  '17 19 12 20 11  '$ CREATININE  --    < > 3.34* 3.71* 2.55* 3.55* 2.54*  CALCIUM  --   --  9.2 9.2 8.9 9.2 8.8*  MG 2.0  --   --  1.9 1.6* 1.7 1.6*  PHOS  --   --  3.6 3.6 3.1 4.4 3.1   < > = values in this interval not displayed.    GFR: Estimated Creatinine Clearance: 29.1 mL/min ([place]) (by C-G formula based on SCr of 2.54 mg/dL (H)).  Liver Function Tests: Recent Labs  Lab 12/17/22 1912 12/18/22 0431 12/19/22 0437 12/20/22 0424 12/21/22 0145  AST  --   --   --  42*  --   ALT  --   --   --  17  --   ALKPHOS  --   --   --  52  --   BILITOT  --   --   --  0.4  --   PROT  --   --   --  7.0  --   ALBUMIN 2.1* 1.9* 2.0* 2.3* 2.1*    CBG: Recent Labs  Lab 12/20/22 1628 12/20/22 2150 12/21/22 0747 12/21/22 1209 12/21/22 1606  GLUCAP 114* 73 78 97 121*     Recent Results (from the past 240 hour(s))  Surgical PCR screen     Status: None   Collection Time: 12/17/22  2:09 AM   Specimen: Nasal Mucosa; Nasal Swab  Result Value Ref Range Status   MRSA, PCR NEGATIVE NEGATIVE Final   Staphylococcus aureus NEGATIVE NEGATIVE Final    Comment: (NOTE) The Xpert SA Assay (FDA approved for NASAL specimens in patients 74 years of age and older), is one component of [place] comprehensive surveillance program. It is not intended to diagnose infection nor to guide or monitor treatment. Performed at [facility], [place] [address], [place], [place] [address]          Radiology Studies: No results found.  Scheduled Meds:  allopurinol  100 mg Oral Daily   aspirin EC  81 mg Oral Daily   atorvastatin  40 mg Oral Daily   [START ON 12/22/2022] Chlorhexidine Gluconate Cloth  6 each Topical Q0600   cycloSPORINE  1 drop Both  Eyes BID   darbepoetin (ARANESP) injection - DIALYSIS  200 mcg Subcutaneous Q Fri-1800   gabapentin  300 mg Oral QID   Gerhardt's butt cream   Topical BID   heparin  5,000 Units Subcutaneous Q8H   isosorbide dinitrate  5 mg Oral BID   leptospermum manuka honey  1 Application Topical Daily   melatonin  4.5 mg Oral QHS   mupirocin ointment  1 Application Nasal BID   polyethylene glycol  17 g Oral Daily   senna-docusate  1 tablet Oral BID   sodium chloride flush  3 mL Intravenous Q12H   sodium chloride flush  3 mL Intravenous Q12H   Continuous Infusions:  sodium chloride       LOS: 8 days    Time spent: over 30 min    Fayrene Helper, MD [facility]   To contact the attending provider between 7A-7P or the covering provider during after hours 7P-7A, please log into the web site [email or web address] and access using universal [place] password for that web site. If you do not have the password, please call the hospital operator.  12/21/2022, 5:57 PM

## 2022-12-23 NOTE — Progress Notes (Addendum)
[name] Progress Note   Subjective:    Seen and examined patient at bedside. Tolerated yesterday's HD with net UF 1L. No acute complaints. Denies SOB, CP, and N/V. Next HD 12/25/22.  Objective Vitals:   12/22/22 2033 12/22/22 2054 12/23/22 0359 12/23/22 0757  BP: (!) 91/54 (!) 106/51 110/62 119/71  Pulse: 86  79 84  Resp: '20  18 18  '$ Temp: 99 F (37.2 C)  98.7 F (37.1 C) 98.9 F (37.2 C)  TempSrc: Oral  Oral Oral  SpO2: 100%  100% 100%  Weight:      Height:       Physical Exam General: Alert male in NAD Heart: RRR, no murmurs, rubs or gallops Lungs: CTA bilaterally, no wheezing, rhonchi or rales Abdomen: Soft, non-distended, +BS Extremities: No edema b/l lower extremities Dialysis Access:  R IJ [facility]  Filed Weights   12/20/22 1057 12/22/22 1212 12/22/22 1547  Weight: 86.6 kg 82.1 kg 81.2 kg    Intake/Output Summary (Last 24 hours) at 12/23/2022 0833 Last data filed at 12/23/2022 0400 Gross per 24 hour  Intake --  Output 1300 ml  Net -1300 ml    Additional Objective Labs: Basic Metabolic Panel: Recent Labs  Lab 12/21/22 0145 12/22/22 0122 12/23/22 0140  NA 137 135 133*  K 3.6 3.4* 3.4*  CL 98 100 97*  CO2 '28 25 26  '$ GLUCOSE 82 105* 120*  BUN '11 16 11  '$ CREATININE 2.54* 3.30* 2.35*  CALCIUM 8.8* 8.9 8.6*  PHOS 3.1 2.9 2.1*   Liver Function Tests: Recent Labs  Lab 12/20/22 0424 12/21/22 0145 12/22/22 0122 12/23/22 0140  AST 42*  --  21  --   ALT 17  --  <5  --   ALKPHOS 52  --  47  --   BILITOT 0.4  --  <0.1*  --   PROT 7.0  --  6.5  --   ALBUMIN 2.3* 2.1* 2.2* 2.2*   No results for input(s): "LIPASE", "AMYLASE" in the last 168 hours. CBC: Recent Labs  Lab 12/19/22 0437 12/19/22 1815 12/20/22 0424 12/21/22 0145 12/22/22 0122 12/23/22 0140  WBC 6.9  --  10.6* 8.5 8.4 8.3  NEUTROABS 3.8  --  8.7* 5.2  --   --   HGB 8.2*   < > 8.1* 7.7* 8.1* 8.1*  HCT 26.6*   < > 26.4* 24.8* 26.2* 26.2*  MCV 102.7*  --  103.5* 102.1* 102.7*  102.3*  PLT 188  --  196 206 224 236   < > = values in this interval not displayed.   Blood Culture    Component Value Date/Time   SDES  09/15/2022 0743    BLOOD LEFT HAND BOTTLES DRAWN AEROBIC AND ANAEROBIC   SPECREQUEST  09/15/2022 0743    Blood Culture results may not be optimal due to an excessive volume of blood received in culture bottles   CULT  09/15/2022 0743    NO GROWTH 5 DAYS Performed at [facility], [address] [place], [place] [address]    REPTSTATUS 09/20/2022 FINAL 09/15/2022 0743    Cardiac Enzymes: No results for input(s): "CKTOTAL", "CKMB", "CKMBINDEX", "TROPONINI" in the last 168 hours. CBG: Recent Labs  Lab 12/22/22 0749 12/22/22 1141 12/22/22 1603 12/22/22 2106 12/23/22 0801  GLUCAP 93 106* 95 133* 106*   Iron Studies:  Recent Labs    12/22/22 0122  IRON 20*  TIBC 105*  FERRITIN 1,139*   Lab Results  Component Value Date  INR 1.1 09/13/2022   INR 1.5 (H) 08/20/2022   INR 1.6 (H) 08/19/2022   Studies/Results: No results found.  Medications:  sodium chloride      allopurinol  100 mg Oral Daily   aspirin EC  81 mg Oral Daily   atorvastatin  40 mg Oral Daily   Chlorhexidine Gluconate Cloth  6 each Topical Q0600   cycloSPORINE  1 drop Both Eyes BID   darbepoetin (ARANESP) injection - DIALYSIS  200 mcg Subcutaneous Q Fri-1800   gabapentin  300 mg Oral QID   [name]   Topical BID   heparin  5,000 Units Subcutaneous Q8H   isosorbide dinitrate  5 mg Oral BID   melatonin  4.5 mg Oral QHS   polyethylene glycol  17 g Oral Daily   senna-docusate  1 tablet Oral BID   sodium chloride flush  3 mL Intravenous Q12H   sodium chloride flush  3 mL Intravenous Q12H    Dialysis Orders: [facility] MWF 4h  400/800  84.5kg  2/2.5 bath   Heparin 4000 units IV  [facility]J TDC - last OP HD 12/20, post wt 85.9kg - last IP hD 12/24, post wt 85kg - mircera 225 mcg IV q2, last 12/18, due 1/02 - no vdra  Assessment/Plan:  Chronic cholecystitis -  w/ nausea, malnutrition, FTT.  S/p laparoscopic cholecystectomy  Chronic HFrEF - LVEF up to 50-55% here, last was 35-40%. LHC done 12/17/2022. Moderate diffuse CAD. No intervention needed per cardiology. Treating medically.  ESRD - on HD MWF. Tolerating HD well. HD unit is on a holiday schedule this week. Patient had dialysis Sunday, Wednesday (12/25/22) and Friday (12/27/22).  K+ 3.4, will continue using added K+ bath.  HTN/ volume -BP stable. No evidence of excess volume by exam. UF as tolerated. Anemia esrd - Hb 8.1, down from 10.5 12/17/2022. Aranesp given 12/29. Tsat low but Ferritin high. No Fe for now and will titrate ESA.-  transfuse PRN MBD ckd - CCa and phos controlled, not on a binder and not on VDRA as OP DM2 - per pmd    [name] Poet, NP [name] 12/23/2022,8:33 AM  LOS: 10 days   Patient seen and examined, agree with above note with above modifications. Appears that SNF placement is planned-  next HD will be Wednesday 1/3 via [facility]-  appropriate changes to HD related medications as above  [name] Parish, MD 12/23/2022

## 2022-12-23 NOTE — Progress Notes (Signed)
Surgically stable from laparoscopic cholecystectomy.  Will sign off.  Available if anything changes.

## 2022-12-23 NOTE — Progress Notes (Signed)
Mobility Specialist - Progress Note   12/23/22 0938  Mobility  Activity Dangled on edge of bed  Level of Assistance Moderate assist, patient does 50-74%  Activity Response Tolerated poorly  Mobility Referral Yes  $Mobility charge 1 Mobility   Pt received in bed and agreeable. Upon EOB pt c/o feeling nauseous and requesting to lay back down. Pt was returned to bed with all needs met.   Franki Monte  Mobility Specialist Please contact via Solicitor or Rehab office at [phone]

## 2022-12-24 DIAGNOSIS — K811 Chronic cholecystitis: Secondary | ICD-10-CM | POA: Diagnosis not present

## 2022-12-24 LAB — CBC
HCT: 25.7 % — ABNORMAL LOW (ref 39.0–52.0)
Hemoglobin: 8 g/dL — ABNORMAL LOW (ref 13.0–17.0)
MCH: 31.9 pg (ref 26.0–34.0)
MCHC: 31.1 g/dL (ref 30.0–36.0)
MCV: 102.4 fL — ABNORMAL HIGH (ref 80.0–100.0)
Platelets: 266 10*3/uL (ref 150–400)
RBC: 2.51 MIL/uL — ABNORMAL LOW (ref 4.22–5.81)
RDW: 17.6 % — ABNORMAL HIGH (ref 11.5–15.5)
WBC: 8.9 10*3/uL (ref 4.0–10.5)
nRBC: 0 % (ref 0.0–0.2)

## 2022-12-24 LAB — RENAL FUNCTION PANEL
Albumin: 2.2 g/dL — ABNORMAL LOW (ref 3.5–5.0)
Anion gap: 13 (ref 5–15)
BUN: 20 mg/dL (ref 8–23)
CO2: 27 mmol/L (ref 22–32)
Calcium: 9.3 mg/dL (ref 8.9–10.3)
Chloride: 97 mmol/L — ABNORMAL LOW (ref 98–111)
Creatinine, Ser: 3.04 mg/dL — ABNORMAL HIGH (ref 0.61–1.24)
GFR, Estimated: 20 mL/min — ABNORMAL LOW (ref >60)
Glucose, Bld: 107 mg/dL — ABNORMAL HIGH (ref 70–99)
Phosphorus: 2.8 mg/dL (ref 2.5–4.6)
Potassium: 3.8 mmol/L (ref 3.5–5.1)
Sodium: 137 mmol/L (ref 135–145)

## 2022-12-24 MED ORDER — PROCHLORPERAZINE EDISYLATE 10 MG/2ML IJ SOLN
5.0000 mg | Freq: Four times a day (QID) | INTRAMUSCULAR | Status: DC | PRN
  Administered 2022-12-24 – 2023-01-17 (×6): 5 mg via INTRAVENOUS
  Filled 2022-12-24 (×6): qty 2

## 2022-12-24 NOTE — Progress Notes (Signed)
[facility] Progress Note   Subjective:    Seen and examined patient at bedside. No acute complaints. Next HD 12/25/21.  Objective Vitals:   12/23/22 1753 12/23/22 2127 12/24/22 0547 12/24/22 0832  BP: (!) 125/42 109/60 110/68 122/60  Pulse: (!) 46 85 75 85  Resp: 18   20  Temp: 99.3 F (37.4 C) 97.6 F (36.4 C) 98 F (36.7 C) 100 F (37.8 C)  TempSrc: Oral Oral Oral Oral  SpO2: 100% 100% 99% 100%  Weight:      Height:       Physical Exam General: Alert male in NAD, on RA Heart: RRR, no murmurs, rubs or gallops Lungs: CTA bilaterally, no wheezing, rhonchi or rales Abdomen: Soft, non-distended, +BS Extremities: No edema b/l lower extremities Dialysis Access:  R IJ [facility]  Filed Weights   12/20/22 1057 12/22/22 1212 12/22/22 1547  Weight: 86.6 kg 82.1 kg 81.2 kg   No intake or output data in the 24 hours ending 12/24/22 0853  Additional Objective Labs: Basic Metabolic Panel: Recent Labs  Lab 12/22/22 0122 12/23/22 0140 12/24/22 0239  NA 135 133* 137  K 3.4* 3.4* 3.8  CL 100 97* 97*  CO2 '25 26 27  '$ GLUCOSE 105* 120* 107*  BUN '16 11 20  '$ CREATININE 3.30* 2.35* 3.04*  CALCIUM 8.9 8.6* 9.3  PHOS 2.9 2.1* 2.8   Liver Function Tests: Recent Labs  Lab 12/20/22 0424 12/21/22 0145 12/22/22 0122 12/23/22 0140 12/24/22 0239  AST 42*  --  21  --   --   ALT 17  --  <5  --   --   ALKPHOS 52  --  47  --   --   BILITOT 0.4  --  <0.1*  --   --   PROT 7.0  --  6.5  --   --   ALBUMIN 2.3*   < > 2.2* 2.2* 2.2*   < > = values in this interval not displayed.   No results for input(s): "LIPASE", "AMYLASE" in the last 168 hours. CBC: Recent Labs  Lab 12/19/22 0437 12/19/22 1815 12/20/22 0424 12/21/22 0145 12/22/22 0122 12/23/22 0140 12/24/22 0239  WBC 6.9  --  10.6* 8.5 8.4 8.3 8.9  NEUTROABS 3.8  --  8.7* 5.2  --   --   --   HGB 8.2*   < > 8.1* 7.7* 8.1* 8.1* 8.0*  HCT 26.6*   < > 26.4* 24.8* 26.2* 26.2* 25.7*  MCV 102.7*  --  103.5* 102.1* 102.7*  102.3* 102.4*  PLT 188  --  196 206 224 236 266   < > = values in this interval not displayed.   Blood Culture    Component Value Date/Time   SDES  09/15/2022 0743    BLOOD LEFT HAND BOTTLES DRAWN AEROBIC AND ANAEROBIC   SPECREQUEST  09/15/2022 0743    Blood Culture results may not be optimal due to an excessive volume of blood received in culture bottles   CULT  09/15/2022 0743    NO GROWTH 5 DAYS Performed at [facility], [address], [place], [place] [address]    REPTSTATUS 09/20/2022 FINAL 09/15/2022 0743    Cardiac Enzymes: No results for input(s): "CKTOTAL", "CKMB", "CKMBINDEX", "TROPONINI" in the last 168 hours. CBG: Recent Labs  Lab 12/22/22 0749 12/22/22 1141 12/22/22 1603 12/22/22 2106 12/23/22 0801  GLUCAP 93 106* 95 133* 106*   Iron Studies:  Recent Labs    12/22/22 0122  IRON 20*  TIBC 105*  FERRITIN 1,139*   Lab Results  Component Value Date   INR 1.1 09/13/2022   INR 1.5 (H) 08/20/2022   INR 1.6 (H) 08/19/2022   Studies/Results: No results found.  Medications:  sodium chloride      allopurinol  100 mg Oral Daily   aspirin EC  81 mg Oral Daily   atorvastatin  40 mg Oral Daily   Chlorhexidine Gluconate Cloth  6 each Topical Q0600   cycloSPORINE  1 drop Both Eyes BID   darbepoetin (ARANESP) injection - DIALYSIS  200 mcg Subcutaneous Q Fri-1800   gabapentin  300 mg Oral QID   Gerhardt's butt cream   Topical BID   heparin  5,000 Units Subcutaneous Q8H   isosorbide dinitrate  5 mg Oral BID   melatonin  4.5 mg Oral QHS   polyethylene glycol  17 g Oral Daily   senna-docusate  1 tablet Oral BID   sodium chloride flush  3 mL Intravenous Q12H   sodium chloride flush  3 mL Intravenous Q12H    Dialysis Orders: [facility] MWF 4h  400/800  84.5kg  2/2.5 bath   Heparin 4000 units IV  RIJ TDC - last OP HD 12/20, post wt 85.9kg - last IP hD 12/24, post wt 85kg - mircera 225 mcg IV q2, last 12/18, due 1/02 - no vdra   Assessment/Plan: Chronic  cholecystitis - w/ nausea, malnutrition, FTT.  S/p laparoscopic cholecystectomy  Chronic HFrEF - LVEF up to 50-55% here, last was 35-40%. LHC done 12/17/2022. Moderate diffuse CAD. No intervention needed per cardiology. Treating medically.  ESRD - on HD MWF. Tolerating HD well. HD unit is on a holiday schedule this week. Patient had dialysis Sunday, Wednesday (12/25/22) and Friday (12/27/22).  K+ 3.8, will continue using added K+ bath.  HTN/ volume -BP stable. No evidence of excess volume by exam. UF as tolerated. Anemia esrd - Hb 8.0, down from 10.5 12/17/2022. Aranesp given 12/29. Tsat low but Ferritin high. No Fe for now and will titrate ESA.-  transfuse PRN MBD ckd - CCa and phos controlled, not on a binder and not on VDRA as OP DM2 - per [place], NP [facility] 12/24/2022,8:53 AM  LOS: 11 days

## 2022-12-24 NOTE — Care Management Important Message (Signed)
Important Message  Patient Details  Name: [name] MRN: [id number] Date of Birth: Aug 01, 1946   Medicare Important Message Given:  Yes     Shelda Altes 12/24/2022, 12:21 PM

## 2022-12-24 NOTE — TOC Progression Note (Signed)
Transition of Care [facility]) - Progression Note    Patient Details  Name: [name] MRN: [id number] Date of Birth: [birth year]  Transition of Care [facility]) CM/SW [place], [place] Phone Number: 12/24/2022, 10:17 AM  Clinical Narrative:     Update- CSW received call back from [place] with [facility]. Sean clinical liaison with [facility] informed [name] in admissions unable to accept patient for SNF. Debbie informed CSW going to follow up with [name] and [place] regarding denial . Patient initially accepted by facility and has been there before. CSW will continue to follow. CSW informed MD.   CSW received insurance authorization approval for patient Plan Auth ID# [id number] [facility] ID# [id number]. Insurance authorization has been approved from 1/1-1/3. CSW informed Debbie with [facility]. Debbie informed CSW going to check on bed availability today.CSW awaiting call back. CSW will continue to follow and assist with patients dc planning needs.  Expected Discharge Plan: [place] Barriers to Discharge: Continued Medical Work up  Expected Discharge Plan and Services In-house Referral: Clinical Social Work   Post Acute Care Choice: [place] Living arrangements for the past 2 months: [place] Determinants of Health (SDOH) Interventions SDOH Screenings   Food Insecurity: No Food Insecurity (09/13/2022)  Housing: Low Risk  (12/17/2022)  Transportation Needs: No Transportation Needs (12/17/2022)  Utilities: Not At Risk (12/17/2022)  Tobacco Use: Medium Risk (12/20/2022)    Readmission Risk Interventions    09/16/2022   12:31 PM 08/02/2022    2:23 PM  Readmission Risk Prevention Plan  Transportation Screening Complete Complete  Medication Review (RN Care Manager) Complete Complete  HRI or Home Care Consult Complete Complete  SW Recovery Care/Counseling Consult Complete  Complete  Palliative Care Screening Not Applicable Not Applicable  Skilled Nursing Facility Not Applicable Complete

## 2022-12-24 NOTE — Progress Notes (Signed)
PROGRESS NOTE    [name]  ACZ:[id number] DOB: 1946-12-14 DOA: 12/12/2022 PCP: Redmond School, MD  No chief complaint on file.  Brief Narrative:  [name] is [name] 77 y.o. male with PMH ESRD on MWF HD, HFrEF, T2DM, HTN who presented with nausea. He has [name] known history of cholecystitis and had undergone cholecystostomy tube placement.  His tube had become dislodged and he underwent evaluation with surgery.  He was noted to have recanalization of his bile duct and therefore the tube was not replaced. He presented to the hospital for elective cholecystectomy and underwent preop evaluation with cardiology.  His workup was notable for diffuse T wave inversions and prolonged QTc.  He was also noted to be significantly hypokalemic.  He was started on electrolyte replacement and admitted for further workup and monitoring. Nephrology was also consulted to continue his chronic HD during hospitalization. Despite improved metabolic parameters, ECG changes persisted, so he was transferred to [facility] for cardiac catheterization on 12/26.   Assessment & Plan:   Principal Problem:   Chronic cholecystitis Active Problems:   ESRD (end stage renal disease) ([place])   Coronary artery disease involving native coronary artery of native heart without angina pectoris   Hypokalemia   Diabetes mellitus type 2 in obese (HCC)   Anemia, normocytic normochromic   Essential hypertension   Heart failure with improved ejection fraction (HFimpEF) (HCC)   HFrEF (heart failure with reduced ejection fraction) (HCC)   Nausea and vomiting   Pressure injury of skin of sacral region   Prolonged QT interval   Generalized weakness   Abnormal EKG   Preoperative cardiovascular examination  Chronic cholecystitis: Causing failure to thrive, malnutrition.  -now s/p cholecystectomy 12/28 with intraoperative ICG cholangiography -per gen surgery, stable for d/c when medically stable   Chronic HFrEF, abnormal ECG: Repeat echo  this admission with LVEF up to 50-55% from 35-40% in August 2023.  - Manage volume with HD - Cotninue cardiac monitoring - LHC 12/26 with multivessel diffuse moderate CAD with no acute or obvious flow limiting lesions (in absence of any active anginal symptoms, would not PCI in preop setting)  - recommending medical therapy, monitor for symptoms, ok to proceed with operation - high dose statin - Cardiology following patient   ESRD: Last HD off schedule early 12/24. Typically MWF.  - Nephrology consulted for HD.    Anemia of ESRD:  - ESA per nephrology - appears to have received 1 unit pRBC during this hospitalization    Hypokalemia: Resolved.    T2DM:  A1c 4.4 (likely reflecting his recent failure to thrive, poor PO intake) D/c SSI   Functional Quadriplegia  Essentially bedbound for the past year (needs help toileting, turning in bed, dressing, etc) Not quite clear to me the cause Follow outpatient  Unstageable sacral pressure injury, POA:  Foul smelling, decubitus wound which has progressed when compared to the previous image from 12/21 WOC c/s - appreciate recs I ordered hydrotherapy, but after comparing to previous image, would consider having general surgery eval for debridement as well given apparent worsening  Pressure Injury 11/06/22 Sacrum Mid Unstageable - Full thickness tissue loss in which the base of the injury is covered by slough (yellow, tan, gray, green or brown) and/or eschar (tan, brown or black) in the wound bed. reddended area to the [place] with (Active)  11/06/22 1315  Location: Sacrum  Location Orientation: Mid  Staging: Unstageable - Full thickness tissue loss in which the base of the injury  is covered by slough (yellow, tan, gray, green or brown) and/or eschar (tan, brown or black) in the wound bed.  Wound Description (Comments): reddended area to the sacruum with yellow eschar and foul smell  Present on Admission: Yes      DVT prophylaxis:  heparin Code Status: full Family Communication: none Disposition:   Status is: Inpatient Remains inpatient appropriate because: working on SNF   Consultants:  Renal Surgery cardiology  Procedures:  12/28  Laparoscopic Cholecystectomy with intraoperative ICG cholangiography      LHC   Prox RCA-1 lesion is 50% stenosed. Prox RCA-2 lesion is 20% stenosed.   Mid RCA to Dist RCA lesion is 65% stenosed.   RPDA lesion is 60% stenosed.   RPAV lesion is 15% stenosed.  2nd RPL lesion is 30% stenosed.   Dist LAD-1 lesion is 55% stenosed. Dist/apical LAD-2 lesion is 75% stenosed.   Dist/apical LAD-2 lesion is 75% stenosed.   Prox 3rd Mrg is 40% stenosed   LV end diastolic pressure is normal.   POST CATH DIAGNOSES Mulitvessel diffuse Moderate CAD with no acute or obvious Flow-Limiting lesions. Prox RCA eccentril 50-55%, distal RCA eccentric calcified shelf lesion ~60-65% & ostial rPDA 60% --> In tandem could potentialy be RFR/FFR positive. However, in the absence of any ongoing active anginal symptoms, would not PCI in the preop setting. Early distal LAD eccentric 55 to 60% and apical 70% stenosed this cannot amenable for PCI. Otherwise diffuse mild to moderate disease in the LCx Normal LVEDP-normal EF by Echo      RECOMMENDATIONS Aggressive to direct medical therapy for moderate diffuse CAD.  Monitor for symptoms.  But for now would simply treat medically. Would proceed with planned operation without further cardiac evaluation given nonobstructive CAD and normal echo.   Echo IMPRESSIONS     1. Limited Echo to evaluate LVEF   2. Left ventricular ejection fraction, by estimation, is 50 to 55%. The  left ventricle has low normal function. The left ventricle has no regional  wall motion abnormalities. Left ventricular diastolic function could not  be evaluated.   3. The mitral valve is grossly normal. Mild mitral valve regurgitation.  No evidence of mitral stenosis.   4. The  inferior vena cava is normal in size with greater than 50%  respiratory variability, suggesting right atrial pressure of 3 mmHg.   Comparison(s): Changes from prior study are noted. LVEF improved from  35-40% in 07/2022.   Antimicrobials:  Anti-infectives (From admission, onward)    Start     Dose/Rate Route Frequency Ordered Stop   12/13/22 0600  cefoTEtan (CEFOTAN) 2 g in sodium chloride 0.9 % 100 mL IVPB  Status:  Discontinued        2 g 200 mL/hr over 30 Minutes Intravenous On call to O.R. 12/12/22 1631 12/14/22 0559   12/12/22 0615  cefoTEtan (CEFOTAN) 2 g in sodium chloride 0.9 % 100 mL IVPB  Status:  Discontinued        2 g 200 mL/hr over 30 Minutes Intravenous On call to O.R. 12/12/22 0614 12/12/22 0913       Subjective: No new complaints  Objective: Vitals:   12/23/22 1753 12/23/22 2127 12/24/22 0547 12/24/22 0832  BP: (!) 125/42 109/60 110/68 122/60  Pulse: (!) 46 85 75 85  Resp: 18   20  Temp: 99.3 F (37.4 C) 97.6 F (36.4 C) 98 F (36.7 C) 100 F (37.8 C)  TempSrc: Oral Oral Oral Oral  SpO2: 100% 100% 99%  100%  Weight:      Height:       No intake or output data in the 24 hours ending 12/24/22 1855  Filed Weights   12/20/22 1057 12/22/22 1212 12/22/22 1547  Weight: 86.6 kg 82.1 kg 81.2 kg    Examination:  General: No acute distress. Cardiovascular: RRR Lungs: unlabored Abdomen: Soft, nontender, nondistended Neurological: Alert, moving all extremities Deep unstageable sacral decub, foul smelling Extremities: No clubbing or cyanosis. No edema.  Data Reviewed: I have personally reviewed following labs and imaging studies  CBC: Recent Labs  Lab 12/18/22 0431 12/19/22 0437 12/19/22 1815 12/20/22 0424 12/21/22 0145 12/22/22 0122 12/23/22 0140 12/24/22 0239  WBC 9.1 6.9  --  10.6* 8.5 8.4 8.3 8.9  NEUTROABS 5.7 3.8  --  8.7* 5.2  --   --   --   HGB 8.1* 8.2*   < > 8.1* 7.7* 8.1* 8.1* 8.0*  HCT 25.3* 26.6*   < > 26.4* 24.8* 26.2* 26.2*  25.7*  MCV 100.4* 102.7*  --  103.5* 102.1* 102.7* 102.3* 102.4*  PLT 206 188  --  196 206 224 236 266   < > = values in this interval not displayed.    Basic Metabolic Panel: Recent Labs  Lab 12/18/22 0431 12/19/22 0437 12/20/22 0424 12/21/22 0145 12/22/22 0122 12/23/22 0140 12/24/22 0239  NA 139 135 136 137 135 133* 137  K 4.9 4.2 5.3* 3.6 3.4* 3.4* 3.8  CL 106 98 102 98 100 97* 97*  CO2 '28 29 25 28 25 26 27  '$ GLUCOSE 87 81 110* 82 105* 120* 107*  BUN '19 12 20 11 16 11 20  '$ CREATININE 3.71* 2.55* 3.55* 2.54* 3.30* 2.35* 3.04*  CALCIUM 9.2 8.9 9.2 8.8* 8.9 8.6* 9.3  MG 1.9 1.6* 1.7 1.6* 1.6*  --   --   PHOS 3.6 3.1 4.4 3.1 2.9 2.1* 2.8    GFR: Estimated Creatinine Clearance: 23.4 mL/min (Miranda Garber) (by C-G formula based on SCr of 3.04 mg/dL (H)).  Liver Function Tests: Recent Labs  Lab 12/20/22 0424 12/21/22 0145 12/22/22 0122 12/23/22 0140 12/24/22 0239  AST 42*  --  21  --   --   ALT 17  --  <5  --   --   ALKPHOS 52  --  47  --   --   BILITOT 0.4  --  <0.1*  --   --   PROT 7.0  --  6.5  --   --   ALBUMIN 2.3* 2.1* 2.2* 2.2* 2.2*    CBG: Recent Labs  Lab 12/22/22 0749 12/22/22 1141 12/22/22 1603 12/22/22 2106 12/23/22 0801  GLUCAP 93 106* 95 133* 106*     Recent Results (from the past 240 hour(s))  Surgical PCR screen     Status: None   Collection Time: 12/17/22  2:09 AM   Specimen: Nasal Mucosa; Nasal Swab  Result Value Ref Range Status   MRSA, PCR NEGATIVE NEGATIVE Final   Staphylococcus aureus NEGATIVE NEGATIVE Final    Comment: (NOTE) The Xpert SA Assay (FDA approved for NASAL specimens in patients 32 years of age and older), is one component of [facility] comprehensive surveillance program. It is not intended to diagnose infection nor to guide or monitor treatment. Performed at [facility], [place] [address], [place], [place] [address]          Radiology Studies: No results found.      Scheduled Meds:  allopurinol  100 mg Oral Daily  aspirin EC  81 mg Oral Daily   atorvastatin  40 mg Oral Daily   Chlorhexidine Gluconate Cloth  6 each Topical Q0600   cycloSPORINE  1 drop Both Eyes BID   darbepoetin (ARANESP) injection - DIALYSIS  200 mcg Subcutaneous Q Fri-1800   gabapentin  300 mg Oral QID   Gerhardt's butt cream   Topical BID   heparin  5,000 Units Subcutaneous Q8H   isosorbide dinitrate  5 mg Oral BID   melatonin  4.5 mg Oral QHS   polyethylene glycol  17 g Oral Daily   senna-docusate  1 tablet Oral BID   sodium chloride flush  3 mL Intravenous Q12H   sodium chloride flush  3 mL Intravenous Q12H   Continuous Infusions:  sodium chloride       LOS: 11 days    Time spent: over 30 min    Fayrene Helper, MD [facility]   To contact the attending provider between 7A-7P or the covering provider during after hours 7P-7A, please log into the web site [email or web address] and access using universal [place] password for that web site. If you do not have the password, please call the hospital operator.  12/24/2022, 6:55 PM

## 2022-12-24 NOTE — Progress Notes (Signed)
Mobility Specialist - Progress Note   12/24/22 1212  Mobility  Activity Dangled on edge of bed  Level of Assistance Moderate assist, patient does 50-74%  Assistive Device None  Activity Response Tolerated poorly  Mobility Referral Yes  $Mobility charge 1 Mobility   Pt was received in bed and agreeable to session. Pt was ModA to EOB. Upon getting to EOB pt c/o pain at bedsore sight and requested to lay back down. Pt was returned to bed with all needs met and bed alarm on.   Franki Monte  Mobility Specialist Please contact via Solicitor or Rehab office at [phone]

## 2022-12-25 DIAGNOSIS — I10 Essential (primary) hypertension: Secondary | ICD-10-CM | POA: Diagnosis not present

## 2022-12-25 DIAGNOSIS — R531 Weakness: Secondary | ICD-10-CM

## 2022-12-25 DIAGNOSIS — K811 Chronic cholecystitis: Secondary | ICD-10-CM | POA: Diagnosis not present

## 2022-12-25 DIAGNOSIS — I5020 Unspecified systolic (congestive) heart failure: Secondary | ICD-10-CM

## 2022-12-25 DIAGNOSIS — L89150 Pressure ulcer of sacral region, unstageable: Secondary | ICD-10-CM | POA: Diagnosis not present

## 2022-12-25 DIAGNOSIS — E43 Unspecified severe protein-calorie malnutrition: Secondary | ICD-10-CM | POA: Insufficient documentation

## 2022-12-25 LAB — CBC
HCT: 25.7 % — ABNORMAL LOW (ref 39.0–52.0)
Hemoglobin: 7.7 g/dL — ABNORMAL LOW (ref 13.0–17.0)
MCH: 31.2 pg (ref 26.0–34.0)
MCHC: 30 g/dL (ref 30.0–36.0)
MCV: 104 fL — ABNORMAL HIGH (ref 80.0–100.0)
Platelets: 314 10*3/uL (ref 150–400)
RBC: 2.47 MIL/uL — ABNORMAL LOW (ref 4.22–5.81)
RDW: 17.7 % — ABNORMAL HIGH (ref 11.5–15.5)
WBC: 11.6 10*3/uL — ABNORMAL HIGH (ref 4.0–10.5)
nRBC: 0 % (ref 0.0–0.2)

## 2022-12-25 LAB — RENAL FUNCTION PANEL
Albumin: 2.2 g/dL — ABNORMAL LOW (ref 3.5–5.0)
Anion gap: 9 (ref 5–15)
BUN: 29 mg/dL — ABNORMAL HIGH (ref 8–23)
CO2: 27 mmol/L (ref 22–32)
Calcium: 9.2 mg/dL (ref 8.9–10.3)
Chloride: 100 mmol/L (ref 98–111)
Creatinine, Ser: 3.38 mg/dL — ABNORMAL HIGH (ref 0.61–1.24)
GFR, Estimated: 18 mL/min — ABNORMAL LOW (ref >60)
Glucose, Bld: 151 mg/dL — ABNORMAL HIGH (ref 70–99)
Phosphorus: 2.9 mg/dL (ref 2.5–4.6)
Potassium: 3.8 mmol/L (ref 3.5–5.1)
Sodium: 136 mmol/L (ref 135–145)

## 2022-12-25 MED ORDER — VANCOMYCIN HCL 1750 MG/350ML IV SOLN
1750.0000 mg | Freq: Once | INTRAVENOUS | Status: AC
  Administered 2022-12-25: 1750 mg via INTRAVENOUS
  Filled 2022-12-25 (×2): qty 350

## 2022-12-25 MED ORDER — ENSURE ENLIVE PO LIQD
237.0000 mL | Freq: Two times a day (BID) | ORAL | Status: DC
  Administered 2022-12-25 – 2022-12-27 (×4): 237 mL via ORAL

## 2022-12-25 MED ORDER — DARBEPOETIN ALFA 300 MCG/0.6ML IJ SOSY: 300.0000 ug | PREFILLED_SYRINGE | INTRAMUSCULAR | Status: DC

## 2022-12-25 MED ORDER — METRONIDAZOLE 500 MG/100ML IV SOLN
500.0000 mg | Freq: Two times a day (BID) | INTRAVENOUS | Status: AC
  Administered 2022-12-25 – 2022-12-31 (×13): 500 mg via INTRAVENOUS
  Filled 2022-12-25 (×14): qty 100

## 2022-12-25 MED ORDER — SODIUM CHLORIDE 0.9 % IV SOLN
1.0000 g | INTRAVENOUS | Status: DC
  Administered 2022-12-25: 1 g via INTRAVENOUS
  Filled 2022-12-25 (×3): qty 10

## 2022-12-25 MED ORDER — HEPARIN SODIUM (PORCINE) 1000 UNIT/ML IJ SOLN
2000.0000 [IU] | Freq: Once | INTRAMUSCULAR | Status: AC
  Administered 2022-12-25: 2000 [IU] via INTRAVENOUS

## 2022-12-25 MED ORDER — ALTEPLASE 2 MG IJ SOLR: 2.0000 mg | Freq: Once | INTRAMUSCULAR | Status: DC | PRN

## 2022-12-25 MED ORDER — HEPARIN SODIUM (PORCINE) 1000 UNIT/ML DIALYSIS
1000.0000 [IU] | INTRAMUSCULAR | Status: DC | PRN
  Administered 2022-12-25: 1000 [IU]

## 2022-12-25 MED ORDER — VANCOMYCIN HCL IN DEXTROSE 1-5 GM/200ML-% IV SOLN
1000.0000 mg | INTRAVENOUS | Status: DC
  Filled 2022-12-25: qty 200

## 2022-12-25 MED ORDER — ANTICOAGULANT SODIUM CITRATE 4% (200MG/5ML) IV SOLN: 5.0000 mL | Status: DC | PRN

## 2022-12-25 NOTE — Progress Notes (Signed)
   12/25/22 0808  Vitals  Temp 98.2 F (36.8 C)  Temp Source Oral  BP 139/65  MAP (mmHg) 84  BP Location Left Arm  BP Method Automatic  Patient Position (if appropriate) Lying  Pulse Rate 87  Pulse Rate Source Monitor  ECG Heart Rate 87  Resp 17  Oxygen Therapy  SpO2 99 %  O2 Device Room Air  Pulse Oximetry Type Continuous   Received patient in bed to unit.  Alert and oriented.  Informed consent signed and in chart.   Treatment initiated: 0404  Treatment completed: 0802  Patient tolerated well.  Transported back to the room  Alert, without acute distress.  Hand-off given to patient's nurse.   Access used: HD cath Access issues: lines had to be reversed and BFR decreased d/t alarming  Total UF removed: 884m Medication(s) given:  Heparin 2000 units bolus at beginning of tx, Heparin Dwells 3800 units Post HD VS: see above Post HD weight: 83.4kg   [facility]

## 2022-12-25 NOTE — Progress Notes (Signed)
PT Cancellation Note  Patient Details Name: USMAN MILLETT MRN: [id number] DOB: [birth year]   Cancelled Treatment:    Reason Eval/Treat Not Completed: (P) Patient at procedure or test/unavailable Pt is off the floor for HD. PT will follow back for treatment this afternoon as able.  Khilee Hendricksen B. Migdalia Dk PT, DPT Acute Rehabilitation Services Please use secure chat or  Call Office [phone]   [place] 12/25/2022, 7:28 AM

## 2022-12-25 NOTE — Progress Notes (Signed)
Physical Therapy Wound Treatment Patient Details  Name: [name] MRN: [id number] Date of Birth: [birth year]  Today's Date: 12/25/2022 Time: 7782-4235 Time Calculation (min): 44 min  Subjective  Subjective Assessment Patient and Family Stated Goals: have less pain Date of Onset:  (pt states about 4 months) Prior Treatments: medihoney  Pain Score:  Pt received IV and oral pain medication prior to treatment. Facial Pain score 6/10   Wound Assessment  Pressure Injury 11/06/22 Sacrum Mid Unstageable - Full thickness tissue loss in which the base of the injury is covered by slough (yellow, tan, gray, green or brown) and/or eschar (tan, brown or black) in the wound bed. reddended area to the sacruum with (Active)  Wound Image   12/25/22 1400  Dressing Type ABD;Honey;Abdominal binder;Barrier Film (skin prep);Moist to dry;Other (Comment) 12/25/22 1400  Dressing Changed 12/25/22 1400  Dressing Change Frequency Daily 12/25/22 1400  State of Healing Non-healing 12/25/22 1400  Site / Wound Assessment Black;Brown;Pink 12/25/22 1400  % Wound base Yellow/Fibrinous Exudate 85% 12/25/22 1400  % Wound base Black/Eschar 15% 12/25/22 1400  Peri-wound Assessment Denuded;Pink;Bleeding;Other (Comment);Black 12/25/22 1400  Wound Length (cm) 3.2 cm 12/25/22 1400  Wound Width (cm) 3.4 cm 12/25/22 1400  Wound Depth (cm) 2.8 cm 12/25/22 1400  Wound Surface Area (cm^2) 10.88 cm^2 12/25/22 1400  Wound Volume (cm^3) 30.46 cm^3 12/25/22 1400  Tunneling (cm) Y 12/17/22 0100  Undermining (cm) Undermined from 7 o'clock to 1 o'clock 2 cm 7-12 1.5 cm 12-1 12/25/22 1400  Margins Unattached edges (unapproximated) 12/25/22 1400  Drainage Amount Moderate 12/25/22 1400  Drainage Description Odor - foul;Purulent;Serous 12/25/22 1400  Treatment Irrigation;Packing (Dry gauze);Debridement (Selective) 12/25/22 1400    Selective Debridement (non-excisional) Selective Debridement (non-excisional) - Location: deep base of  sacral wound Selective Debridement (non-excisional) - Tools Used: Forceps, Scissors Selective Debridement (non-excisional) - Tissue Removed: gray black fibrinous necrotic tissue    Wound Assessment and Plan  Wound Therapy - Assess/Plan/Recommendations Wound Therapy - Clinical Statement: Pt reports having the wound for about 4 months. Wound is now deep, likely to bone and connective tissue, however unable to visualize due to blackish gray fibrinous tissue overlaying wound. Wound currently has foul smelling drainage. Conferred with WOC RN and order will be placed for a bout of Dakin's solution treatment and then return of Medihoney afterwards for continued autolytic debridement. Pt has very poor nutrition and is diabetic receiving HD and will benefit from continued hydrotherapy to reduce bioburden to help promote wound healing. Also have ordered airmattress to decrease pressure to sacrum. Wound Therapy - Functional Problem List: decreased mobility Factors Delaying/Impairing Wound Healing: Diabetes Mellitus, Incontinence, Immobility, Multiple medical problems Hydrotherapy Plan: Debridement, Patient/family education, Dressing change Wound Therapy - Frequency:  (2x/wk) Wound Therapy - Current Recommendations: WOC nurse Wound Therapy - Follow Up Recommendations: dressing changes by RN  Wound Therapy Goals- Improve the function of patient's integumentary system by progressing the wound(s) through the phases of wound healing (inflammation - proliferation - remodeling) by: Wound Therapy Goals - Improve the function of patient's integumentary system by progressing the wound(s) through the phases of wound healing by: Decrease Necrotic Tissue to: 90 Decrease Necrotic Tissue - Progress: Goal set today Increase Granulation Tissue to: 5% Increase Granulation Tissue - Progress: Goal set today Additional Wound Therapy Goal - Progress: Goal set today Time For Goal Achievement: 7 days Wound Therapy - Potential  for Goals: Fair  Goals will be updated until maximal potential achieved or discharge criteria met.  Discharge criteria: when goals  achieved, discharge from hospital, MD decision/surgical intervention, no progress towards goals, refusal/missing three consecutive treatments without notification or medical reason.  GP     Charges PT Wound Care Charges $Wound Debridement up to 20 cm: < or equal to 20 cm $ Wound Debridement each add'l 20 sqcm: 1 $PT Hydrotherapy Visit: 1 Visit     Nillie Bartolotta B. Migdalia Dk PT, DPT Acute Rehabilitation Services Please use secure chat or  Call Office [phone]   [place] 12/25/2022, 3:22 PM

## 2022-12-25 NOTE — Progress Notes (Addendum)
[facility] Progress Note   Subjective:    Seen and examined patient on HD unit. He completed treatment and noted net UF 800cc. BP remains stable. He tolerated treatment well with no acute complaints.  Objective Vitals:   12/25/22 0802 12/25/22 0806 12/25/22 0808 12/25/22 0852  BP: 131/63 124/65 139/65 (!) 147/51  Pulse: 86  87 91  Resp: '17 15 17 18  '$ Temp:   98.2 F (36.8 C) 98.3 F (36.8 C)  TempSrc:   Oral Oral  SpO2: 99%  99% 100%  Weight:   83.4 kg   Height:       Physical Exam General: Alert male in NAD, on RA Heart: RRR, no murmurs, rubs or gallops Lungs: CTA bilaterally, no wheezing, rhonchi or rales Abdomen: Soft, non-distended, +BS Extremities: No edema b/l lower extremities Dialysis Access:  R IJ [facility]  Filed Weights   12/22/22 1212 12/22/22 1547 12/25/22 0808  Weight: 82.1 kg 81.2 kg 83.4 kg    Intake/Output Summary (Last 24 hours) at 12/25/2022 0900 Last data filed at 12/25/2022 0802 Gross per 24 hour  Intake --  Output 800 ml  Net -800 ml    Additional Objective Labs: Basic Metabolic Panel: Recent Labs  Lab 12/23/22 0140 12/24/22 0239 12/25/22 0159  NA 133* 137 136  K 3.4* 3.8 3.8  CL 97* 97* 100  CO2 '26 27 27  '$ GLUCOSE 120* 107* 151*  BUN 11 20 29*  CREATININE 2.35* 3.04* 3.38*  CALCIUM 8.6* 9.3 9.2  PHOS 2.1* 2.8 2.9   Liver Function Tests: Recent Labs  Lab 12/20/22 0424 12/21/22 0145 12/22/22 0122 12/23/22 0140 12/24/22 0239 12/25/22 0159  AST 42*  --  21  --   --   --   ALT 17  --  <5  --   --   --   ALKPHOS 52  --  47  --   --   --   BILITOT 0.4  --  <0.1*  --   --   --   PROT 7.0  --  6.5  --   --   --   ALBUMIN 2.3*   < > 2.2* 2.2* 2.2* 2.2*   < > = values in this interval not displayed.   No results for input(s): "LIPASE", "AMYLASE" in the last 168 hours. CBC: Recent Labs  Lab 12/19/22 0437 12/19/22 1815 12/20/22 0424 12/21/22 0145 12/22/22 0122 12/23/22 0140 12/24/22 0239 12/25/22 0417  WBC 6.9  --   10.6* 8.5 8.4 8.3 8.9 11.6*  NEUTROABS 3.8  --  8.7* 5.2  --   --   --   --   HGB 8.2*   < > 8.1* 7.7* 8.1* 8.1* 8.0* 7.7*  HCT 26.6*   < > 26.4* 24.8* 26.2* 26.2* 25.7* 25.7*  MCV 102.7*  --  103.5* 102.1* 102.7* 102.3* 102.4* 104.0*  PLT 188  --  196 206 224 236 266 314   < > = values in this interval not displayed.   Blood Culture    Component Value Date/Time   SDES  09/15/2022 0743    BLOOD LEFT HAND BOTTLES DRAWN AEROBIC AND ANAEROBIC   SPECREQUEST  09/15/2022 0743    Blood Culture results may not be optimal due to an excessive volume of blood received in culture bottles   CULT  09/15/2022 0743    NO GROWTH 5 DAYS Performed at [facility], [address], [place], [place] [address]    REPTSTATUS 09/20/2022 FINAL 09/15/2022 [phone]  Cardiac Enzymes: No results for input(s): "CKTOTAL", "CKMB", "CKMBINDEX", "TROPONINI" in the last 168 hours. CBG: Recent Labs  Lab 12/22/22 0749 12/22/22 1141 12/22/22 1603 12/22/22 2106 12/23/22 0801  GLUCAP 93 106* 95 133* 106*   Iron Studies: No results for input(s): "IRON", "TIBC", "TRANSFERRIN", "FERRITIN" in the last 72 hours. Lab Results  Component Value Date   INR 1.1 09/13/2022   INR 1.5 (H) 08/20/2022   INR 1.6 (H) 08/19/2022   Studies/Results: No results found.  Medications:   allopurinol  100 mg Oral Daily   aspirin EC  81 mg Oral Daily   atorvastatin  40 mg Oral Daily   Chlorhexidine Gluconate Cloth  6 each Topical Q0600   cycloSPORINE  1 drop Both Eyes BID   darbepoetin (ARANESP) injection - DIALYSIS  200 mcg Subcutaneous Q Fri-1800   gabapentin  300 mg Oral QID   Gerhardt's butt cream   Topical BID   heparin  5,000 Units Subcutaneous Q8H   isosorbide dinitrate  5 mg Oral BID   melatonin  4.5 mg Oral QHS   polyethylene glycol  17 g Oral Daily   senna-docusate  1 tablet Oral BID   sodium chloride flush  3 mL Intravenous Q12H    Dialysis Orders: [facility] MWF 4h  400/800  84.5kg  2/2.5 bath   Heparin 4000 units IV   [facility]J TDC - last OP HD 12/20, post wt 85.9kg - last IP hD 12/24, post wt 85kg - mircera 225 mcg IV q2, last 12/18, due 1/02 - no vdra  Assessment/Plan: Chronic cholecystitis - w/ nausea, malnutrition, FTT.  S/p laparoscopic cholecystectomy  Chronic HFrEF - LVEF up to 50-55% here, last was 35-40%. LHC done 12/17/2022. Moderate diffuse CAD. No intervention needed per cardiology. Treating medically.  ESRD - on HD MWF. Tolerating HD well. HD unit is on a holiday schedule this week. Patient had dialysis Sunday, Wednesday (12/25/22) and Friday (12/27/22).  K+ 3.8, will continue using added K+ bath.  HTN/ volume -BP stable. No evidence of excess volume by exam. UF as tolerated. Anemia esrd - Hb now 7.7 , down from 10.5 12/17/2022. Aranesp given 12/29. Tsat low but Ferritin high. No Fe for now and will raise ESA to 380mg.-  transfuse PRN MBD ckd - CCa and phos controlled, not on a binder and not on VDRA as OP DM2 - per pmd    CTobie Poet NP [facility] 12/25/2022,9:00 AM  LOS: 12 days    Seen and examined independently.  Agree with note and exam as documented above by physician extender and as noted here.  He had HD earlier today with 800 mL UF.  No shortness of breat.  States is for SNF placement.  Having nausea no vomiting. No shortness of breath   General adult male in bed in no acute distress HEENT normocephalic atraumatic extraocular movements intact sclera anicteric Neck supple trachea midline Lungs clear to auscultation bilaterally normal work of breathing at rest  Heart S1S2 no rub Abdomen soft nontender nondistended Extremities no edema  Psych normal mood and affect Right IJ tunn catheter  Cholecystitis - s/p cholecystectomy. Supportive care of nausea per primary team   ESRD - HD per MWF schedule   Chronic HF with reduced EF - optimize volume with HD  Disposition per primary team   LClaudia Desanctis MD 12/25/2022  2:52 PM

## 2022-12-25 NOTE — Progress Notes (Addendum)
[facility]  PROGRESS NOTE  [name] HWE:[id number] DOB: 1946-07-17 DOA: 12/12/2022 PCP: Redmond School, MD   Brief HPI:   77 year old male with past medical history of ESRD on hemodialysis Monday Wednesday Friday, HFrEF, diabetes mellitus type 2, hypertension who presented with nausea.  Patient has known history of cholecystitis and underwent cholecystostomy tube placement.  His tube had become dislodged and he underwent evaluation with surgery.  He was noted to have recanalization of the bile duct therefore tube was not replaced.  He came to hospital for elective cholecystectomy underwent preop evaluation with cardiology.  His workup was notable for diffuse T wave inversions and prolonged QTc.  Also was found to be significantly hypokalemic.  He was started on electrolyte replacement.  Nephrology was consulted for chronic hemodialysis during hospitalization.  Despite improvement of Aulik parameters, ECG changes persisted so he was transferred to [facility] for cardiac catheterization on 12/17/2022.    Subjective   Patient denies any symptoms.  Today developed fever with 101 F after hydrotherapy for decubitus ulcer.   Assessment/Plan:     Chronic cholecystitis -Presented with malnutrition, failure to thrive -General surgery was consulted, underwent cholecystectomy on 12/28 with intraoperative cholangiogram 3 -Per general surgery patient stable for discharge when medically stable  Chronic HFrEF, abnormal ECG -Repeat echo during this admission showed EF of 50 to 55% and 35 to 40% in August 2023 -Continue cardiac monitoring -Left heart cath on 12/26 showed multivessel diffuse moderate CAD with no acute or obvious flow-limiting lesions(in absence of any active anginal symptoms, would not PCI in preop setting)  -Medical management recommended, okay to proceed with surgery as per cardiology -Started on high-dose statin  ESRD -Nephrology consulted for hemodialysis  Anemia of  ESRD -ESA per nephrology -S/p 1 unit PRBC during this hospitalization  Unstageable sacral ulcer -Foul-smelling decubitus wound noted which progressed as compared to previous imaging from 12/21 -Wound care consulted -Today after hydrotherapy patient developed fever with temperature 101 F -Blood cultures x 2 obtained, wound culture obtained -Will start empiric vancomycin and Zosyn -Follow culture results -Will order sacral MRI -General surgery consulted  Diabetes mellitus type 2 A1c 4.4 (likely reflecting his recent failure to thrive, poor PO intake) D/c SSI   Functional Quadriplegia  Essentially bedbound for the past year (needs help toileting, turning in bed, dressing, etc) -Unclear cause Follow outpatient  Medications     allopurinol  100 mg Oral Daily   aspirin EC  81 mg Oral Daily   atorvastatin  40 mg Oral Daily   Chlorhexidine Gluconate Cloth  6 each Topical Q0600   cycloSPORINE  1 drop Both Eyes BID   [START ON 12/27/2022] darbepoetin (ARANESP) injection - DIALYSIS  300 mcg Subcutaneous Q Fri-1800   feeding supplement  237 mL Oral BID BM   gabapentin  300 mg Oral QID   Gerhardt's butt cream   Topical BID   heparin  5,000 Units Subcutaneous Q8H   isosorbide dinitrate  5 mg Oral BID   melatonin  4.5 mg Oral QHS   polyethylene glycol  17 g Oral Daily   senna-docusate  1 tablet Oral BID   sodium chloride flush  3 mL Intravenous Q12H     Data Reviewed:   CBG:  Recent Labs  Lab 12/22/22 0749 12/22/22 1141 12/22/22 1603 12/22/22 2106 12/23/22 0801  GLUCAP 93 106* 95 133* 106*    SpO2: 100 % O2 Flow Rate (L/min): 3 L/min    Vitals:   12/25/22 0806 12/25/22 8938  12/25/22 0852 12/25/22 1500  BP: 124/65 139/65 (!) 147/51 (!) 111/49  Pulse:  87 91 96  Resp: '15 17 18 17  '$ Temp:  98.2 F (36.8 C) 98.3 F (36.8 C) (!) 101.4 F (38.6 C)  TempSrc:  Oral Oral Axillary  SpO2:  99% 100% 100%  Weight:  83.4 kg    Height:          Data Reviewed:  Basic  Metabolic Panel: Recent Labs  Lab 12/19/22 0437 12/20/22 0424 12/21/22 0145 12/22/22 0122 12/23/22 0140 12/24/22 0239 12/25/22 0159  NA 135 136 137 135 133* 137 136  K 4.2 5.3* 3.6 3.4* 3.4* 3.8 3.8  CL 98 102 98 100 97* 97* 100  CO2 '29 25 28 25 26 27 27  '$ GLUCOSE 81 110* 82 105* 120* 107* 151*  BUN '12 20 11 16 11 20 '$ 29*  CREATININE 2.55* 3.55* 2.54* 3.30* 2.35* 3.04* 3.38*  CALCIUM 8.9 9.2 8.8* 8.9 8.6* 9.3 9.2  MG 1.6* 1.7 1.6* 1.6*  --   --   --   PHOS 3.1 4.4 3.1 2.9 2.1* 2.8 2.9    CBC: Recent Labs  Lab 12/19/22 0437 12/19/22 1815 12/20/22 0424 12/21/22 0145 12/22/22 0122 12/23/22 0140 12/24/22 0239 12/25/22 0417  WBC 6.9  --  10.6* 8.5 8.4 8.3 8.9 11.6*  NEUTROABS 3.8  --  8.7* 5.2  --   --   --   --   HGB 8.2*   < > 8.1* 7.7* 8.1* 8.1* 8.0* 7.7*  HCT 26.6*   < > 26.4* 24.8* 26.2* 26.2* 25.7* 25.7*  MCV 102.7*  --  103.5* 102.1* 102.7* 102.3* 102.4* 104.0*  PLT 188  --  196 206 224 236 266 314   < > = values in this interval not displayed.    LFT Recent Labs  Lab 12/20/22 0424 12/21/22 0145 12/22/22 0122 12/23/22 0140 12/24/22 0239 12/25/22 0159  AST 42*  --  21  --   --   --   ALT 17  --  <5  --   --   --   ALKPHOS 52  --  47  --   --   --   BILITOT 0.4  --  <0.1*  --   --   --   PROT 7.0  --  6.5  --   --   --   ALBUMIN 2.3* 2.1* 2.2* 2.2* 2.2* 2.2*     Antibiotics: Anti-infectives (From admission, onward)    Start     Dose/Rate Route Frequency Ordered Stop   12/13/22 0600  cefoTEtan (CEFOTAN) 2 g in sodium chloride 0.9 % 100 mL IVPB  Status:  Discontinued        2 g 200 mL/hr over 30 Minutes Intravenous On call to O.R. 12/12/22 1631 12/14/22 0559   12/12/22 0615  cefoTEtan (CEFOTAN) 2 g in sodium chloride 0.9 % 100 mL IVPB  Status:  Discontinued        2 g 200 mL/hr over 30 Minutes Intravenous On call to O.R. 12/12/22 1443 12/12/22 0913        DVT prophylaxis: Heparin  Code Status: Full code  Family Communication: No family at  bedside   CONSULTS General surgery   Objective    Physical Examination:   General: Appears in no acute distress Cardiovascular: S1-S2, regular, no murmur auscultated Respiratory: Clear to auscultation bilaterally Abdomen: Soft, nontender, no organomegaly Extremities: No edema in the lower extremities Neurologic: No focal deficit noted, moving all  extremities Skin-full-thickness tissue loss, unstageable ulcer noted in the sacrum with foul-smelling discharge   Status is: Inpatient:      Pressure Injury 11/06/22 Sacrum Mid Unstageable - Full thickness tissue loss in which the base of the injury is covered by slough (yellow, tan, gray, green or brown) and/or eschar (tan, brown or black) in the wound bed. reddended area to the [place] with (Active)  11/06/22 1315  Location: Sacrum  Location Orientation: Mid  Staging: Unstageable - Full thickness tissue loss in which the base of the injury is covered by slough (yellow, tan, gray, green or brown) and/or eschar (tan, brown or black) in the wound bed.  Wound Description (Comments): reddended area to the sacruum with yellow eschar and foul smell  Present on Admission: Yes        [place]   [facility] If 7PM-7AM, please contact night-coverage at [email or web address], Office  [phone]   12/25/2022, 5:57 PM  LOS: 12 days

## 2022-12-25 NOTE — TOC Progression Note (Signed)
Transition of Care [facility]) - Progression Note    Patient Details  Name: [name] MRN: [id number] Date of Birth: [birth year]  Transition of Care [facility]) CM/SW [place] Phone Number: 12/25/2022, 4:59 PM  Clinical Narrative:      Update- CSW attemted to call Debbie with [facility] again. No answer. CSW left another voicemail. CSW awaiting call back.  CSW attempted to call Debbie with [facility]  times to follow up on status of snf placement. CSW LVM. CSW awaiting call back. CSW will need to restart insurance authorization for patient close to patient being medically ready for dc. CSW will continue to follow and assist with patients dc planning needs.  Expected Discharge Plan: [place] Barriers to Discharge: Continued Medical Work up  Expected Discharge Plan and Services In-house Referral: Clinical Social Work   Post Acute Care Choice: [place] Living arrangements for the past 2 months: [place] Determinants of Health (SDOH) Interventions SDOH Screenings   Food Insecurity: No Food Insecurity (09/13/2022)  Housing: Low Risk  (12/17/2022)  Transportation Needs: No Transportation Needs (12/17/2022)  Utilities: Not At Risk (12/17/2022)  Tobacco Use: Medium Risk (12/20/2022)    Readmission Risk Interventions    09/16/2022   12:31 PM 08/02/2022    2:23 PM  Readmission Risk Prevention Plan  Transportation Screening Complete Complete  Medication Review (RN Care Manager) Complete Complete  HRI or Home Care Consult Complete Complete  SW Recovery Care/Counseling Consult Complete Complete  Palliative Care Screening Not Applicable Not Applicable  Skilled Nursing Facility Not Applicable Complete

## 2022-12-25 NOTE — Progress Notes (Signed)
Pharmacy Antibiotic Note  [name] is a 77 y.o. male admitted on 12/12/2022 with  nausea .  Noted to have foul smelling unstageable sacral ulcer that has progressed compared to previous imaging 12/21. Patient developed a fever after hydrotherapy on 1/3. Pharmacy has been consulted for zosyn >> cefepime/flagyl and vancomycin dosing.  ESRD on HD MWF  Plan: Vanc '1750mg'$  IV x1, followed by '1000mg'$  IV qHD session Cefepime 1g IV q24 hours Flagyl '500mg'$  IV q12 hours F/u blood and wound cultures, vanc levels as needed    Height: '6\' 3"'$  (190.5 cm) Weight: 83.4 kg (183 lb 13.8 oz) IBW/kg (Calculated) : 84.5  Temp (24hrs), Avg:99.1 F (37.3 C), Min:98.2 F (36.8 C), Max:101.4 F (38.6 C)  Recent Labs  Lab 12/21/22 0145 12/22/22 0122 12/23/22 0140 12/24/22 0239 12/25/22 0159 12/25/22 0417  WBC 8.5 8.4 8.3 8.9  --  11.6*  CREATININE 2.54* 3.30* 2.35* 3.04* 3.38*  --     Estimated Creatinine Clearance: 21.6 mL/min (A) (by C-G formula based on SCr of 3.38 mg/dL (H)).    Allergies  Allergen Reactions   Metformin Nausea And Vomiting   Niacin    Niacin And Related Other (See Comments)    Reaction: flushing and burning side effects even with extended release    Antimicrobials this admission: Vancomycin 1/3 >>  Cefepime 1/3 >>  Flagyl 1/3 >>  Dose adjustments this admission:  Microbiology results: 1/3 BCx:  1/3 Wound Cx: 12/19 MRSA PCR: not detected   Thank you for allowing pharmacy to be a part of this patient's care.  Dimple Nanas, PharmD, BCPS 12/25/2022 6:24 PM

## 2022-12-25 NOTE — Progress Notes (Signed)
OT Cancellation Note  Patient Details Name: [name] MRN: [id number] DOB: July 28, 1946   Cancelled Treatment:    Reason Eval/Treat Not Completed: Patient at procedure or test/ unavailable (pt off unit in HD, will follow up as schedule permits.)  [name], OTD, OTR/L SecureChat Preferred Acute Rehab [phone] - [place] 12/25/2022, 7:23 AM

## 2022-12-25 NOTE — Progress Notes (Addendum)
Nutrition Follow-up  DOCUMENTATION CODES:   Severe malnutrition in context of chronic illness  INTERVENTION:  - Modify to Regular diet.   - Add Ensure Enlive po BID, each supplement provides 350 kcal and 20 grams of protein.  NUTRITION DIAGNOSIS:   Severe Malnutrition related to chronic illness as evidenced by moderate fat depletion, moderate muscle depletion, percent weight loss, energy intake < or equal to 75% for > or equal to 1 month.  GOAL:   Patient will meet greater than or equal to 90% of their needs  MONITOR:   PO intake, Supplement acceptance  REASON FOR ASSESSMENT:   Malnutrition Screening Tool    ASSESSMENT:   77 y.o. male admits related to elective cholecystectomy. PMH reviewed: CKD, T2DM, GERD, Gout, HTN, Vertigo. Pt is currently receiving medical management for chronic cholecystitis.  Meds reviewed: lipitor, miralax, senokot. Labs reviewed.   The pt reports that he has been eating fairly well. However, tray was at bedside that looked untouched. Per record, pt's intakes very from 20-100%. It appears that the pt is not meeting his needs. RD will modify pt to a regular diet. RD will also add Ensure BID for now. If intakes do not improve within 24-48 hrs, consider Cortrak placement. Per record, pt has experienced a 16% wt loss in 3 months.   NUTRITION - FOCUSED PHYSICAL EXAM:  Flowsheet Row Most Recent Value  Orbital Region Moderate depletion  Upper Arm Region Moderate depletion  Thoracic and Lumbar Region Unable to assess  Buccal Region Moderate depletion  Temple Region Moderate depletion  Clavicle Bone Region Moderate depletion  Clavicle and Acromion Bone Region Moderate depletion  Scapular Bone Region Unable to assess  Dorsal Hand Moderate depletion  Patellar Region Mild depletion  Anterior Thigh Region Mild depletion  Posterior Calf Region Mild depletion  Edema (RD Assessment) None  Hair Unable to assess  Eyes Reviewed  Mouth Reviewed  Skin  Reviewed  Nails Reviewed       Diet Order:   Diet Order             Diet regular Room service appropriate? Yes; Fluid consistency: Thin  Diet effective now                   EDUCATION NEEDS:   Not appropriate for education at this time  Skin:  Skin Assessment: Skin Integrity Issues: Skin Integrity Issues:: Unstageable Unstageable: mid sacrum  Last BM:  12/23/22  Height:   Ht Readings from Last 1 Encounters:  12/17/22 6\' 3"  (1.905 m)    Weight:   Wt Readings from Last 1 Encounters:  12/25/22 83.4 kg    Ideal Body Weight:     BMI:  Body mass index is 22.98 kg/m.  Estimated Nutritional Needs:   Kcal:  4782-9562 kcals  Protein:  125-165 gm  Fluid:  >/= 2.5 L  Bethann Humble, RD, LDN, CNSC.

## 2022-12-26 ENCOUNTER — Inpatient Hospital Stay (HOSPITAL_COMMUNITY): Payer: Medicare Other

## 2022-12-26 DIAGNOSIS — L89150 Pressure ulcer of sacral region, unstageable: Secondary | ICD-10-CM | POA: Diagnosis not present

## 2022-12-26 DIAGNOSIS — I10 Essential (primary) hypertension: Secondary | ICD-10-CM | POA: Diagnosis not present

## 2022-12-26 DIAGNOSIS — K811 Chronic cholecystitis: Secondary | ICD-10-CM | POA: Diagnosis not present

## 2022-12-26 DIAGNOSIS — R531 Weakness: Secondary | ICD-10-CM | POA: Diagnosis not present

## 2022-12-26 LAB — RENAL FUNCTION PANEL
Albumin: 2.1 g/dL — ABNORMAL LOW (ref 3.5–5.0)
Anion gap: 13 (ref 5–15)
BUN: 18 mg/dL (ref 8–23)
CO2: 27 mmol/L (ref 22–32)
Calcium: 9 mg/dL (ref 8.9–10.3)
Chloride: 94 mmol/L — ABNORMAL LOW (ref 98–111)
Creatinine, Ser: 2.66 mg/dL — ABNORMAL HIGH (ref 0.61–1.24)
GFR, Estimated: 24 mL/min — ABNORMAL LOW (ref >60)
Glucose, Bld: 121 mg/dL — ABNORMAL HIGH (ref 70–99)
Phosphorus: 3.1 mg/dL (ref 2.5–4.6)
Potassium: 4.8 mmol/L (ref 3.5–5.1)
Sodium: 134 mmol/L — ABNORMAL LOW (ref 135–145)

## 2022-12-26 LAB — GLUCOSE, CAPILLARY
Glucose-Capillary: 111 mg/dL — ABNORMAL HIGH (ref 70–99)
Glucose-Capillary: 115 mg/dL — ABNORMAL HIGH (ref 70–99)
Glucose-Capillary: 145 mg/dL — ABNORMAL HIGH (ref 70–99)

## 2022-12-26 MED ORDER — DAKINS (1/4 STRENGTH) 0.125 % EX SOLN: Freq: Every day | CUTANEOUS | Status: AC | PRN

## 2022-12-26 MED ORDER — DAKINS (1/4 STRENGTH) 0.125 % EX SOLN
Freq: Every day | CUTANEOUS | Status: AC
  Filled 2022-12-26: qty 473

## 2022-12-26 MED ORDER — DARBEPOETIN ALFA 200 MCG/0.4ML IJ SOSY
200.0000 ug | PREFILLED_SYRINGE | INTRAMUSCULAR | Status: DC
  Administered 2022-12-27 – 2023-01-17 (×4): 200 ug via SUBCUTANEOUS
  Filled 2022-12-26 (×4): qty 0.4

## 2022-12-26 MED ORDER — SODIUM CHLORIDE 0.9 % IV SOLN
1.0000 g | INTRAVENOUS | Status: AC
  Administered 2022-12-27 – 2022-12-31 (×6): 1 g via INTRAVENOUS
  Filled 2022-12-26 (×6): qty 10

## 2022-12-26 NOTE — Progress Notes (Signed)
7 Days Post-Op   Subjective/Chief Complaint: Reports sacral discomfort, improved with PO meds.    Objective: Vital signs in last 24 hours: Temp:  [98.3 F (36.8 C)-101.4 F (38.6 C)] 98.4 F (36.9 C) (01/04 0701) Pulse Rate:  [90-100] 90 (01/04 0701) Resp:  [17-100] 100 (01/04 0701) BP: (105-147)/(49-68) 137/68 (01/04 0701) SpO2:  [99 %-100 %] 100 % (01/04 0701) Last BM Date : 12/23/22 (per pt report)  Intake/Output from previous day: 01/03 0701 - 01/04 0700 In: 1153 [P.O.:600; I.V.:3; IV Piggyback:550] Out: 1100 [Urine:300] Intake/Output this shift: No intake/output data recorded.  Wound: appx 6cm diameter unstageable decubitus wound with tunneling superiorly about 3cm. Malodorous fibrinous slough in the wound bed but no palpable fluctuance or surrounding induration.   Lab Results:  Recent Labs    12/24/22 0239 12/25/22 0417  WBC 8.9 11.6*  HGB 8.0* 7.7*  HCT 25.7* 25.7*  PLT 266 314    BMET Recent Labs    12/25/22 0159 12/26/22 0148  NA 136 134*  K 3.8 4.8  CL 100 94*  CO2 27 27  GLUCOSE 151* 121*  BUN 29* 18  CREATININE 3.38* 2.66*  CALCIUM 9.2 9.0    PT/INR No results for input(s): "LABPROT", "INR" in the last 72 hours. ABG No results for input(s): "PHART", "HCO3" in the last 72 hours.  Invalid input(s): "PCO2", "PO2"  Studies/Results: No results found.  Anti-infectives: Anti-infectives (From admission, onward)    Start     Dose/Rate Route Frequency Ordered Stop   12/27/22 1200  vancomycin (VANCOCIN) IVPB 1000 mg/200 mL premix        1,000 mg 200 mL/hr over 60 Minutes Intravenous Every M-W-F (Hemodialysis) 12/25/22 1831     12/25/22 1900  ceFEPIme (MAXIPIME) 1 g in sodium chloride 0.9 % 100 mL IVPB        1 g 200 mL/hr over 30 Minutes Intravenous Every 24 hours 12/25/22 1812     12/25/22 1900  vancomycin (VANCOREADY) IVPB 1750 mg/350 mL        1,750 mg 175 mL/hr over 120 Minutes Intravenous  Once 12/25/22 1812 12/26/22 0146   12/25/22  1900  metroNIDAZOLE (FLAGYL) IVPB 500 mg        500 mg 100 mL/hr over 60 Minutes Intravenous Every 12 hours 12/25/22 1812     12/13/22 0600  cefoTEtan (CEFOTAN) 2 g in sodium chloride 0.9 % 100 mL IVPB  Status:  Discontinued        2 g 200 mL/hr over 30 Minutes Intravenous On call to O.R. 12/12/22 1631 12/14/22 0559   12/12/22 0615  cefoTEtan (CEFOTAN) 2 g in sodium chloride 0.9 % 100 mL IVPB  Status:  Discontinued        2 g 200 mL/hr over 30 Minutes Intravenous On call to O.R. 12/12/22 8099 12/12/22 0913       Assessment/Plan: s/p Procedure(s): LAPAROSCOPIC CHOLECYSTECTOMY (N/A)  Decubitus wound. Recommend continued local wound care/ hydrotherapy, offloading and specialized mattress, maximize nutrition. Would not recommend surgical debridement at this time.   MRI ordered by primary team but patient has SNS. Ct would be adequate to rule out abscess but clinically none apparent.    LOS: 13 days    [name] Riley MD  12/26/2022

## 2022-12-26 NOTE — Progress Notes (Signed)
Physical Therapy Treatment Patient Details Name: [name] MRN: [id number] DOB: 1946-05-30 Today's Date: 12/26/2022   History of Present Illness 77 yo male admitted to [facility] 12/21 for cholecystectomy which was canceled due to hypokalemia and prolonged Qtc. Transfer to [facility] 12/26 with cardiac cath performed same date. 12/28 lap chole. PMHx: ESRD on HD MWF, HFrEF, DM, HTN, anxiety, HLD, Bil TKA, gout, GERD    PT Comments    Pt is continuing to demonstrate a functional decline, needing TA x2 for bed mobility today. He was able to sit EOB for ~7 min today, but was limited by symptomatic orthostatic hypotension, see measures below. Pt is very weak, needing assistance to lift his legs off the bed and demonstrating deficits in bil knee extension ROM. Will continue to follow acutely. Current recommendations remain appropriate.   BP & HR Orthostatic Measurements: 123/82 ( 93) HR 100 Supine 104/87( 95) HR 108 Sitting initially 98/72( 80) HR 110 Sitting after ~5 min with lower extremity exercises 144/71( 90) HR 97 supine end of session   Recommendations for follow up therapy are one component of a multi-disciplinary discharge planning process, led by the attending physician.  Recommendations may be updated based on patient status, additional functional criteria and insurance authorization.  Follow Up Recommendations  Skilled nursing-short term rehab (<3 hours/day) Can patient physically be transported by private vehicle: No   Assistance Recommended at Discharge Frequent or constant Supervision/Assistance  Patient can return home with the following Two people to help with walking and/or transfers;Two people to help with bathing/dressing/bathroom;Assistance with cooking/housework;Assistance with feeding;Assist for transportation;Direct supervision/assist for medications management;Direct supervision/assist for financial management   Equipment Recommendations  Hospital bed;Wheelchair (measurements  PT);Wheelchair cushion (measurements PT) (hoyer; air mattress)    Recommendations for Other Services       Precautions / Restrictions Precautions Precautions: Fall;Other (comment) Precaution Comments: sacral wound Restrictions Weight Bearing Restrictions: No     Mobility  Bed Mobility Overal bed mobility: Needs Assistance Bed Mobility: Supine to Sit, Sit to Supine     Supine to sit: HOB elevated, Total assist, +2 for physical assistance, +2 for safety/equipment Sit to supine: +2 for safety/equipment, Total assist, +2 for physical assistance   General bed mobility comments: Cued pt to bring legs towards EOB but poor initiation noted by pt. Pt hesitant to ascend trunk to sit up due to pain, needing TAx2 to sit up R EOB and then to return to supine.    Transfers                   General transfer comment: unable    Ambulation/Gait               General Gait Details: unable   Stairs             Wheelchair Mobility    Modified Rankin (Stroke Patients Only)       Balance Overall balance assessment: Needs assistance Sitting-balance support: Bilateral upper extremity supported, Feet supported Sitting balance-Leahy Scale: Poor Sitting balance - Comments: Posterior bias throughout session, needing modA majority of time to maintain balance. Brief moments of min guard assist. Pt sitting EOB ~7 min before being returned to bed due to symptomatic orthostatic hypotension. Postural control: Posterior lean     Standing balance comment: unable                            Cognition Arousal/Alertness: Awake/alert Behavior During Therapy: Flat affect  Overall Cognitive Status: Impaired/Different from baseline Area of Impairment: Safety/judgement, Problem solving, Awareness, Attention, Following commands                   Current Attention Level: Focused   Following Commands: Follows one step commands inconsistently, Follows one step  commands with increased time Safety/Judgement: Decreased awareness of deficits, Decreased awareness of safety Awareness: Intellectual Problem Solving: Slow processing, Difficulty sequencing, Decreased initiation, Requires verbal cues, Requires tactile cues General Comments: Pt with flat affect and needing repeated multi-modal simple cues throughout session to sequence mobility. Poor initiation by pt.        Exercises General Exercises - Lower Extremity Ankle Circles/Pumps: AAROM, Both, 10 reps, Seated    General Comments General comments (skin integrity, edema, etc.): 123/82 ( 93) HR 100 Supine; 104/87( 95) HR 108 Sitting initially; 98/72( 80) HR 110 Sitting after ~5 min with lower extremity exercises; 144/71( 90) HR 97 supine end of session      Pertinent Vitals/Pain Pain Assessment Pain Assessment: Faces Faces Pain Scale: Hurts little more Pain Location: sacrum Pain Descriptors / Indicators: Sore, Discomfort, Grimacing Pain Intervention(s): Limited activity within patient's tolerance, Monitored during session, Repositioned, Other (comment) (used bed controls to roll pt end of session)    Home Living                          Prior Function            PT Goals (current goals can now be found in the care plan section) Acute Rehab PT Goals Patient Stated Goal: improve PT Goal Formulation: With patient Time For Goal Achievement: 01/06/23 Potential to Achieve Goals: Fair Progress towards PT goals: Not progressing toward goals - comment (limited by orthostatic hypotension)    Frequency    Min 2X/week      PT Plan Current plan remains appropriate    Co-evaluation PT/OT/SLP Co-Evaluation/Treatment: Yes Reason for Co-Treatment: For patient/therapist safety;To address functional/ADL transfers PT goals addressed during session: Mobility/safety with mobility;Balance;Strengthening/ROM        AM-PAC PT "6 Clicks" Mobility   Outcome Measure  Help needed turning  from your back to your side while in a flat bed without using bedrails?: A Lot Help needed moving from lying on your back to sitting on the side of a flat bed without using bedrails?: Total Help needed moving to and from a bed to a chair (including a wheelchair)?: Total Help needed standing up from a chair using your arms (e.g., wheelchair or bedside chair)?: Total Help needed to walk in hospital room?: Total Help needed climbing 3-5 steps with a railing? : Total 6 Click Score: 7    End of Session   Activity Tolerance: Other (comment) (limited by orhtostatic hypotension) Patient left: in bed;with call bell/phone within reach;with bed alarm set Nurse Communication: Mobility status;Other (comment) (vitals) PT Visit Diagnosis: Unsteadiness on feet (R26.81);Other abnormalities of gait and mobility (R26.89);Muscle weakness (generalized) (M62.81);Difficulty in walking, not elsewhere classified (R26.2)     Time: 4010-2725 PT Time Calculation (min) (ACUTE ONLY): 20 min  Charges:  $Therapeutic Activity: 8-22 mins                     Moishe Spice, PT, DPT Acute Rehabilitation Services  Office: [phone]    Orvan Falconer 12/26/2022, 4:10 PM

## 2022-12-26 NOTE — Consult Note (Signed)
[place] Nurse wound follow up Wound type: unstageable pressure injury Wound bed: slough, devitalized tissue Drainage (amount, consistency, odor) moderate serosanguinous  Dressing procedure/placement/frequency: Pt is on anticoagulation so conservative sharps debridement will be minimal.  PT requests to apply dakins moist gauze between treatments to soften and aid in debridement.  ORder is placed.  Will not follow at this time.  Please re-consult if needed.  Estrellita Ludwig MSN, RN, FNP-BC CWON Wound, Ostomy, Continence Nurse [facility] [phone] Pager [phone]

## 2022-12-26 NOTE — Progress Notes (Addendum)
[facility]  PROGRESS NOTE  [name] YSA:[id number] DOB: [birth year] DOA: 12/12/2022 PCP: Redmond School, MD   Brief HPI:   77 year old male with past medical history of ESRD on hemodialysis Monday Wednesday Friday, HFrEF, diabetes mellitus type 2, hypertension who presented with nausea.  Patient has known history of cholecystitis and underwent cholecystostomy tube placement.  His tube had become dislodged and he underwent evaluation with surgery.  He was noted to have recanalization of the bile duct therefore tube was not replaced.  He came to hospital for elective cholecystectomy underwent preop evaluation with cardiology.  His workup was notable for diffuse T wave inversions and prolonged QTc.  Also was found to be significantly hypokalemic.  He was started on electrolyte replacement.  Nephrology was consulted for chronic hemodialysis during hospitalization.  Despite improvement of Aulik parameters, ECG changes persisted so he was transferred to [facility] for cardiac catheterization on 12/17/2022.    Subjective   Patient seen and examined, denies pain or shortness of breath.  Appreciate surgery input.   Assessment/Plan:     Chronic cholecystitis -Presented with malnutrition, failure to thrive -General surgery was consulted, underwent cholecystectomy on 12/28 with intraoperative cholangiogram  -Per general surgery patient stable for discharge when medically stable  Chronic HFrEF, abnormal ECG -Repeat echo during this admission showed EF of 50 to 55% and 35 to 40% in August 2023 -Continue cardiac monitoring -Left heart cath on 12/26 showed multivessel diffuse moderate CAD with no acute or obvious flow-limiting lesions(in absence of any active anginal symptoms, would not PCI in preop setting)  -Medical management recommended, okay to proceed with surgery as per cardiology -Started on high-dose statin  ESRD -Nephrology consulted for hemodialysis  Anemia of ESRD -ESA per  nephrology -S/p 1 unit PRBC during this hospitalization  Unstageable sacral ulcer -Foul-smelling decubitus wound noted which progressed as compared to previous imaging from 12/21 -Wound care consulted -Today after hydrotherapy patient developed fever with temperature 101 F -Blood cultures x 2 obtained, wound culture obtained -Started on empiric vancomycin and Zosyn -Follow culture results -sacral MRI ordered, not done as patient has SNS -Will obtain CT pelvis to rule out osteomyelitis. -General surgery consulted; do not recommend debridement  Diabetes mellitus type 2 A1c 4.4 (likely reflecting his recent failure to thrive, poor PO intake) D/c SSI   Functional Quadriplegia  Essentially bedbound for the past year (needs help toileting, turning in bed, dressing, etc) -Unclear cause Follow outpatient  Protein calorie malnutrition -Patient has poor p.o. intake -BMI 22.98 kg/m -Will place core track feeding tube; additional nutrition should help heal sacral ulcer  Medications     allopurinol  100 mg Oral Daily   aspirin EC  81 mg Oral Daily   atorvastatin  40 mg Oral Daily   Chlorhexidine Gluconate Cloth  6 each Topical Q0600   cycloSPORINE  1 drop Both Eyes BID   [START ON 12/27/2022] darbepoetin (ARANESP) injection - DIALYSIS  200 mcg Subcutaneous Q Fri-1800   feeding supplement  237 mL Oral BID BM   Gerhardt's butt cream   Topical BID   heparin  5,000 Units Subcutaneous Q8H   isosorbide dinitrate  5 mg Oral BID   melatonin  4.5 mg Oral QHS   polyethylene glycol  17 g Oral Daily   senna-docusate  1 tablet Oral BID   sodium chloride flush  3 mL Intravenous Q12H     Data Reviewed:   CBG:  Recent Labs  Lab 12/22/22 1141 12/22/22 1603 12/22/22 2106 12/23/22  0801 12/26/22 0815  GLUCAP 106* 95 133* 106* 111*    SpO2: 100 % O2 Flow Rate (L/min): 3 L/min    Vitals:   12/25/22 1500 12/25/22 2000 12/26/22 0701 12/26/22 0934  BP: (!) 111/49 (!) 105/56 137/68 (!)  142/74  Pulse: 96 100 90   Resp: 17 19 (!) 100   Temp: (!) 101.4 F (38.6 C) 98.8 F (37.1 C) 98.4 F (36.9 C)   TempSrc: Axillary Oral Oral   SpO2: 100% 99% 100%   Weight:      Height:          Data Reviewed:  Basic Metabolic Panel: Recent Labs  Lab 12/20/22 0424 12/21/22 0145 12/22/22 0122 12/23/22 0140 12/24/22 0239 12/25/22 0159 12/26/22 0148  NA 136 137 135 133* 137 136 134*  K 5.3* 3.6 3.4* 3.4* 3.8 3.8 4.8  CL 102 98 100 97* 97* 100 94*  CO2 '25 28 25 26 27 27 27  '$ GLUCOSE 110* 82 105* 120* 107* 151* 121*  BUN '20 11 16 11 20 '$ 29* 18  CREATININE 3.55* 2.54* 3.30* 2.35* 3.04* 3.38* 2.66*  CALCIUM 9.2 8.8* 8.9 8.6* 9.3 9.2 9.0  MG 1.7 1.6* 1.6*  --   --   --   --   PHOS 4.4 3.1 2.9 2.1* 2.8 2.9 3.1    CBC: Recent Labs  Lab 12/20/22 0424 12/21/22 0145 12/22/22 0122 12/23/22 0140 12/24/22 0239 12/25/22 0417  WBC 10.6* 8.5 8.4 8.3 8.9 11.6*  NEUTROABS 8.7* 5.2  --   --   --   --   HGB 8.1* 7.7* 8.1* 8.1* 8.0* 7.7*  HCT 26.4* 24.8* 26.2* 26.2* 25.7* 25.7*  MCV 103.5* 102.1* 102.7* 102.3* 102.4* 104.0*  PLT 196 206 224 236 266 314    LFT Recent Labs  Lab 12/20/22 0424 12/21/22 0145 12/22/22 0122 12/23/22 0140 12/24/22 0239 12/25/22 0159 12/26/22 0148  AST 42*  --  21  --   --   --   --   ALT 17  --  <5  --   --   --   --   ALKPHOS 52  --  47  --   --   --   --   BILITOT 0.4  --  <0.1*  --   --   --   --   PROT 7.0  --  6.5  --   --   --   --   ALBUMIN 2.3*   < > 2.2* 2.2* 2.2* 2.2* 2.1*   < > = values in this interval not displayed.     Antibiotics: Anti-infectives (From admission, onward)    Start     Dose/Rate Route Frequency Ordered Stop   12/27/22 1200  vancomycin (VANCOCIN) IVPB 1000 mg/200 mL premix        1,000 mg 200 mL/hr over 60 Minutes Intravenous Every M-W-F (Hemodialysis) 12/25/22 1831     12/25/22 1900  ceFEPIme (MAXIPIME) 1 g in sodium chloride 0.9 % 100 mL IVPB        1 g 200 mL/hr over 30 Minutes Intravenous Every 24  hours 12/25/22 1812     12/25/22 1900  vancomycin (VANCOREADY) IVPB 1750 mg/350 mL        1,750 mg 175 mL/hr over 120 Minutes Intravenous  Once 12/25/22 1812 12/26/22 0146   12/25/22 1900  metroNIDAZOLE (FLAGYL) IVPB 500 mg        500 mg 100 mL/hr over 60 Minutes Intravenous Every 12 hours 12/25/22 1812  12/13/22 0600  cefoTEtan (CEFOTAN) 2 g in sodium chloride 0.9 % 100 mL IVPB  Status:  Discontinued        2 g 200 mL/hr over 30 Minutes Intravenous On call to O.R. 12/12/22 1631 12/14/22 0559   12/12/22 0615  cefoTEtan (CEFOTAN) 2 g in sodium chloride 0.9 % 100 mL IVPB  Status:  Discontinued        2 g 200 mL/hr over 30 Minutes Intravenous On call to O.R. 12/12/22 7939 12/12/22 0913        DVT prophylaxis: Heparin  Code Status: Full code  Family Communication: No family at bedside   CONSULTS General surgery   Objective    Physical Examination:     Status is: Inpatient:      Pressure Injury 11/06/22 Sacrum Mid Unstageable - Full thickness tissue loss in which the base of the injury is covered by slough (yellow, tan, gray, green or brown) and/or eschar (tan, brown or black) in the wound bed. reddended area to the [place] with (Active)  11/06/22 1315  Location: Sacrum  Location Orientation: Mid  Staging: Unstageable - Full thickness tissue loss in which the base of the injury is covered by slough (yellow, tan, gray, green or brown) and/or eschar (tan, brown or black) in the wound bed.  Wound Description (Comments): reddended area to the sacruum with yellow eschar and foul smell  Present on Admission: Yes        [place]   [facility] If 7PM-7AM, please contact night-coverage at [email or web address], Office  [phone]   12/26/2022, 10:20 AM  LOS: 13 days

## 2022-12-26 NOTE — Progress Notes (Signed)
Mobility Specialist - Progress Note   12/26/22 1243  Mobility  Level of Assistance Total care  Assistive Device None  Range of Motion/Exercises All extremities;Active  Activity Response Tolerated well  $Mobility charge 1 Mobility   Pt was received in bed needing to be transfer to new bed. No complaints throughout transfer. Pt was left in bed with all needs met. RN and NT present.   Franki Monte  Mobility Specialist Please contact via Solicitor or Rehab office at [phone]

## 2022-12-26 NOTE — Progress Notes (Addendum)
[facility] Progress Note   Subjective: Completed dialysis yesterday with minimal UF. Lying on his side -endorses pain in sacral wound. He's hungry and wants to eat - ordered breakfast. Denies cp, dyspnea.   Objective Vitals:   12/25/22 0852 12/25/22 1500 12/25/22 2000 12/26/22 0701  BP: (!) 147/51 (!) 111/49 (!) 105/56 137/68  Pulse: 91 96 100 90  Resp: '18 17 19 '$ (!) 100  Temp: 98.3 F (36.8 C) (!) 101.4 F (38.6 C) 98.8 F (37.1 C) 98.4 F (36.9 C)  TempSrc: Oral Axillary Oral Oral  SpO2: 100% 100% 99% 100%  Weight:      Height:          Additional Objective Labs: Basic Metabolic Panel: Recent Labs  Lab 12/24/22 0239 12/25/22 0159 12/26/22 0148  NA 137 136 134*  K 3.8 3.8 4.8  CL 97* 100 94*  CO2 '27 27 27  '$ GLUCOSE 107* 151* 121*  BUN 20 29* 18  CREATININE 3.04* 3.38* 2.66*  CALCIUM 9.3 9.2 9.0  PHOS 2.8 2.9 3.1   CBC: Recent Labs  Lab 12/20/22 0424 12/21/22 0145 12/22/22 0122 12/23/22 0140 12/24/22 0239 12/25/22 0417  WBC 10.6* 8.5 8.4 8.3 8.9 11.6*  NEUTROABS 8.7* 5.2  --   --   --   --   HGB 8.1* 7.7* 8.1* 8.1* 8.0* 7.7*  HCT 26.4* 24.8* 26.2* 26.2* 25.7* 25.7*  MCV 103.5* 102.1* 102.7* 102.3* 102.4* 104.0*  PLT 196 206 224 236 266 314   Blood Culture    Component Value Date/Time   SDES WOUND 12/25/2022 1715   SPECREQUEST NONE 12/25/2022 1715   CULT  12/25/2022 1715    TOO YOUNG TO READ Performed at [facility], [place] [address] [place], [place] [address]    REPTSTATUS PENDING 12/25/2022 1715     Physical Exam General: Elderly man lying on side Heart: RRR Lungs: Clear bilaterally Abdomen: soft non-tender Extremities: No edema Dialysis Access: R TDC   Medications:  ceFEPime (MAXIPIME) IV 1 g (12/25/22 2137)   metronidazole 500 mg (12/25/22 2222)   [START ON 12/27/2022] vancomycin      allopurinol  100 mg Oral Daily   aspirin EC  81 mg Oral Daily   atorvastatin  40 mg Oral Daily   Chlorhexidine Gluconate Cloth   6 each Topical Q0600   cycloSPORINE  1 drop Both Eyes BID   [START ON 12/27/2022] darbepoetin (ARANESP) injection - DIALYSIS  200 mcg Subcutaneous Q Fri-1800   feeding supplement  237 mL Oral BID BM   gabapentin  300 mg Oral QID   Gerhardt's butt cream   Topical BID   heparin  5,000 Units Subcutaneous Q8H   isosorbide dinitrate  5 mg Oral BID   melatonin  4.5 mg Oral QHS   polyethylene glycol  17 g Oral Daily   senna-docusate  1 tablet Oral BID   sodium chloride flush  3 mL Intravenous Q12H    Dialysis Orders: [facility] MWF 4h  400/800  84.5kg  2/2.5 bath   Heparin 4000 units IV  RIJ TDC - last OP HD 12/20, post wt 85.9kg - last IP hD 12/24, post wt 85kg - mircera 225 mcg IV q2, last 12/18, due 1/02 - no vdra   Assessment/Plan: Chronic cholecystitis - w/ nausea, malnutrition, FTT.  S/p laparoscopic cholecystectomy  Chronic HFrEF - LVEF up to 50-55% here, last was 35-40%. LHC done 12/17/2022. Moderate diffuse CAD. No intervention needed per cardiology. Treating medically.  ESRD - on  HD MWF. Continue on schedule. Next HD 1/5 HTN/ volume -BP stable. No volume exces UF as tolerated. 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 Aranesp 200 q Friday. Tsat low but Ferritin high. MBD ckd - CCa and phos controlled, not on a binder and not on VDRA as OP DM2 - per pmd   Nutrition - PCM, FTT. On regular diet + protein supp. Encouraged PO intake.  Medication - Gabapentin dose inappropriate for ESRD pt. -Unclear indication -will d/c for now. Can resume at 300 mg q day if needed.   Lynnda Child PA-C [facility] 12/26/2022,9:21 AM    Seen and examined independently.  Agree with note and exam as documented above by physician extender and as noted here.  Spoke with his brother who is at bedside - he's worried about the patient needing to get up out of the bed more to help with wound healing and mobility.  PT is following him (appears for wound treatment and PT).  Shared with the patient our  recommendation to pause and then decrease gabapentin - he agrees and states that he "can't hardly walk" if he takes too much gabapentin.   General thin elderly male in bed in no acute distress HEENT normocephalic atraumatic extraocular movements intact sclera anicteric Neck supple trachea midline Lungs clear to auscultation bilaterally normal work of breathing at rest on room air Heart S1S2 no rub Abdomen soft nontender nondistended Extremities no pitting edema  Psych normal mood and affect Access RIJ tunneled catheter  Cholecystitis - s/p cholecystectomy. Supportive care of nausea per primary team    ESRD - HD per MWF schedule.  Renal panel in AM. Would stop gabapentin as above dose acceptable for ESRD patients - can resume at no more than 300 mg over the course of the day if needed   Chronic HF with reduced EF - optimize volume with HD  Mobility/deconditioning - PT is following for PT and for wound care per charting  Disposition per primary team   Claudia Desanctis, MD 12/26/2022 11:39 AM

## 2022-12-26 NOTE — Progress Notes (Signed)
Nutrition Brief Note:   Pt is planning to have Cortrak tube placed tomorrow.   (Once Cortrak is placed) RD recommends initiating Nepro @ 15 mL/hr and advance by 10 mL Q6H to goal rate of 55 mL/hr + TF20 Prosource TID.   This will provide the pt with 2576 kcals, 166 gm protein and 727 mL free water.   Will also plan to add Juven BID via tube as well.   RD will continue to monitor POC.   Thalia Bloodgood, RD, LDN, CNSC.

## 2022-12-26 NOTE — Progress Notes (Signed)
Occupational Therapy Treatment Patient Details Name: [name] MRN: [id number] DOB: 1946-09-12 Today's Date: 12/26/2022   History of present illness 77 yo male admitted to [facility] 12/21 for cholecystectomy which was canceled due to hypokalemia and prolonged Qtc. Transfer to [facility] 12/26 with cardiac cath performed same date. 12/28 lap chole. PMHx: ESRD on HD MWF, HFrEF, DM, HTN, anxiety, HLD, Bil TKA, gout, GERD   OT comments  Pt seen in conjunction with PT to maximize pts activity tolerance and optimize pt participation. Pt continues to present with impaired balance, decreased activity tolerance, generalized deconditioning and today pt limited by [facility]. Pt currently requires total A +2 for all aspects of bed mobility, pt able to sit EOB for ~ 7 mins with MODA however session limited to dropping BP, see vitals below. Pt would continue to benefit from skilled occupational therapy while admitted and after d/c to address the below listed limitations in order to improve overall functional mobility and facilitate independence with BADL participation. DC plan remains appropriate, will follow acutely per POC.       Recommendations for follow up therapy are one component of a multi-disciplinary discharge planning process, led by the attending physician.  Recommendations may be updated based on patient status, additional functional criteria and insurance authorization.    Follow Up Recommendations  Skilled nursing-short term rehab (<3 hours/day)     Assistance Recommended at Discharge Frequent or constant Supervision/Assistance  Patient can return home with the following  Two people to help with walking and/or transfers;Two people to help with bathing/dressing/bathroom   Equipment Recommendations  None recommended by OT    Recommendations for Other Services      Precautions / Restrictions Precautions Precautions: Fall;Other (comment) Precaution Comments: sacral wound Restrictions Weight Bearing  Restrictions: No       Mobility Bed Mobility Overal bed mobility: Needs Assistance Bed Mobility: Supine to Sit, Sit to Supine     Supine to sit: HOB elevated, Total assist, +2 for physical assistance, +2 for safety/equipment Sit to supine: +2 for safety/equipment, Total assist, +2 for physical assistance   General bed mobility comments: Cued pt to bring legs towards EOB but poor initiation noted by pt. Pt hesitant to ascend trunk to sit up due to pain, needing TAx2 to sit up R EOB and then to return to supine.    Transfers                   General transfer comment: unable     Balance Overall balance assessment: Needs assistance Sitting-balance support: Bilateral upper extremity supported, Feet supported Sitting balance-Leahy Scale: Poor Sitting balance - Comments: Posterior bias throughout session, needing modA majority of time to maintain balance. Brief moments of min guard assist. Pt sitting EOB ~7 min before being returned to bed due to symptomatic orthostatic hypotension. Postural control: Posterior lean     Standing balance comment: unable                           ADL either performed or assessed with clinical judgement   ADL Overall ADL's : Needs assistance/impaired                           Toilet Transfer Details (indicate cue type and reason): unable         Functional mobility during ADLs: Total assistance;+2 for physical assistance (bed mobility only) General ADL Comments: ADL participation impacted by  impaired balance, generalized deconditioning, decreased strength and OH    Extremity/Trunk Assessment Upper Extremity Assessment Upper Extremity Assessment: Generalized weakness   Lower Extremity Assessment Lower Extremity Assessment: Defer to PT evaluation        Vision Baseline Vision/History: 0 No visual deficits     Perception Perception Perception: Not tested   Praxis Praxis Praxis: Not tested    Cognition  Arousal/Alertness: Awake/alert Behavior During Therapy: Flat affect Overall Cognitive Status: Impaired/Different from baseline Area of Impairment: Safety/judgement, Problem solving, Awareness, Attention, Following commands                   Current Attention Level: Focused   Following Commands: Follows one step commands inconsistently, Follows one step commands with increased time Safety/Judgement: Decreased awareness of deficits, Decreased awareness of safety Awareness: Intellectual Problem Solving: Slow processing, Difficulty sequencing, Decreased initiation, Requires verbal cues, Requires tactile cues General Comments: Pt with flat affect and needing repeated multi-modal simple cues throughout session to sequence mobility. Poor initiation by pt.        Exercises      Shoulder Instructions       General Comments 123/82 ( 93) HR 100 Supine; 104/87( 95) HR 108 Sitting initially; 98/72( 80) HR 110 Sitting after ~5 min with lower extremity exercises; 144/71( 90) HR 97 supine end of session    Pertinent Vitals/ Pain       Pain Assessment Pain Assessment: Faces Faces Pain Scale: Hurts little more Pain Location: sacrum Pain Descriptors / Indicators: Sore, Discomfort, Grimacing Pain Intervention(s): Limited activity within patient's tolerance, Monitored during session, Repositioned  Home Living                                          Prior Functioning/Environment              Frequency  Min 1X/week        Progress Toward Goals  OT Goals(current goals can now be found in the care plan section)  Progress towards OT goals: OT to reassess next treatment  Acute Rehab OT Goals Patient Stated Goal: none stated Time For Goal Achievement: 12/27/22 Potential to Achieve Goals: [place] Discharge plan remains appropriate;Frequency remains appropriate    Co-evaluation      Reason for Co-Treatment: For patient/therapist safety;To address  functional/ADL transfers PT goals addressed during session: Mobility/safety with mobility;Balance;Strengthening/ROM OT goals addressed during session: ADL's and self-care;Strengthening/ROM      AM-PAC OT "6 Clicks" Daily Activity     Outcome Measure   Help from another person eating meals?: A Little Help from another person taking care of personal grooming?: A Little Help from another person toileting, which includes using toliet, bedpan, or urinal?: Total Help from another person bathing (including washing, rinsing, drying)?: Total Help from another person to put on and taking off regular upper body clothing?: Total Help from another person to put on and taking off regular lower body clothing?: Total 6 Click Score: 10    End of Session    OT Visit Diagnosis: Unsteadiness on feet (R26.81);Muscle weakness (generalized) (M62.81)   Activity Tolerance Treatment limited secondary to medical complications (Comment);Other (comment) ([place])   Patient Left in bed;with call bell/phone within reach;with bed alarm set   Nurse Communication Mobility status        Time: 3818-2993 OT Time Calculation (min): 23 min  Charges: OT General Charges $OT  Visit: 1 Visit OT Treatments $Self Care/Home Management : 8-22 mins  [name] Alto., COTA/L Acute Rehabilitation Services [phone]   [name] 12/26/2022, 4:41 PM

## 2022-12-26 NOTE — TOC Progression Note (Addendum)
Transition of Care [facility]) - Progression Note    Patient Details  Name: [name] MRN: [id number] Date of Birth: 1946/08/01  Transition of Care [facility]) CM/SW [place], [place] Phone Number: 12/26/2022, 5:13 PM  Clinical Narrative:      Update- CSW spoke with patient and informed him that [facility] denied him for SNF placement. Patient chose SNF placement at [facility]. CSW spoke with Melissa with [facility] who confirmed they can accept patient closer to patient being medically ready. CSW following to restart auth closer to patient being medically ready for dc.   CSW spoke with Debbie with [facility] who informed CSW SNF denied patient . CSW plans to follow up with patient. CSW will continue to follow and assist with patients dc planning needs.  Expected Discharge Plan: [place] Barriers to Discharge: Continued Medical Work up  Expected Discharge Plan and Services In-house Referral: Clinical Social Work   Post Acute Care Choice: [place] Living arrangements for the past 2 months: [place] Determinants of Health (SDOH) Interventions SDOH Screenings   Food Insecurity: No Food Insecurity (09/13/2022)  Housing: Low Risk  (12/17/2022)  Transportation Needs: No Transportation Needs (12/17/2022)  Utilities: Not At Risk (12/17/2022)  Tobacco Use: Medium Risk (12/20/2022)    Readmission Risk Interventions    09/16/2022   12:31 PM 08/02/2022    2:23 PM  Readmission Risk Prevention Plan  Transportation Screening Complete Complete  Medication Review (RN Care Manager) Complete Complete  HRI or Home Care Consult Complete Complete  SW Recovery Care/Counseling Consult Complete Complete  Palliative Care Screening Not Applicable Not Applicable  Skilled Nursing Facility Not Applicable Complete

## 2022-12-27 ENCOUNTER — Inpatient Hospital Stay (HOSPITAL_COMMUNITY): Payer: Medicare Other

## 2022-12-27 DIAGNOSIS — E1169 Type 2 diabetes mellitus with other specified complication: Secondary | ICD-10-CM | POA: Diagnosis not present

## 2022-12-27 DIAGNOSIS — K811 Chronic cholecystitis: Secondary | ICD-10-CM | POA: Diagnosis not present

## 2022-12-27 DIAGNOSIS — N186 End stage renal disease: Secondary | ICD-10-CM | POA: Diagnosis not present

## 2022-12-27 DIAGNOSIS — D649 Anemia, unspecified: Secondary | ICD-10-CM | POA: Diagnosis not present

## 2022-12-27 DIAGNOSIS — E440 Moderate protein-calorie malnutrition: Secondary | ICD-10-CM | POA: Insufficient documentation

## 2022-12-27 LAB — CBC
HCT: 28.1 % — ABNORMAL LOW (ref 39.0–52.0)
Hemoglobin: 8.8 g/dL — ABNORMAL LOW (ref 13.0–17.0)
MCH: 31.9 pg (ref 26.0–34.0)
MCHC: 31.3 g/dL (ref 30.0–36.0)
MCV: 101.8 fL — ABNORMAL HIGH (ref 80.0–100.0)
Platelets: 329 10*3/uL (ref 150–400)
RBC: 2.76 MIL/uL — ABNORMAL LOW (ref 4.22–5.81)
RDW: 17.3 % — ABNORMAL HIGH (ref 11.5–15.5)
WBC: 10.4 10*3/uL (ref 4.0–10.5)
nRBC: 0 % (ref 0.0–0.2)

## 2022-12-27 LAB — RENAL FUNCTION PANEL
Albumin: 2.2 g/dL — ABNORMAL LOW (ref 3.5–5.0)
Anion gap: 10 (ref 5–15)
BUN: 33 mg/dL — ABNORMAL HIGH (ref 8–23)
CO2: 26 mmol/L (ref 22–32)
Calcium: 9.5 mg/dL (ref 8.9–10.3)
Chloride: 97 mmol/L — ABNORMAL LOW (ref 98–111)
Creatinine, Ser: 3.33 mg/dL — ABNORMAL HIGH (ref 0.61–1.24)
GFR, Estimated: 18 mL/min — ABNORMAL LOW (ref >60)
Glucose, Bld: 119 mg/dL — ABNORMAL HIGH (ref 70–99)
Phosphorus: 3.7 mg/dL (ref 2.5–4.6)
Potassium: 4.8 mmol/L (ref 3.5–5.1)
Sodium: 133 mmol/L — ABNORMAL LOW (ref 135–145)

## 2022-12-27 LAB — GLUCOSE, CAPILLARY: Glucose-Capillary: 154 mg/dL — ABNORMAL HIGH (ref 70–99)

## 2022-12-27 MED ORDER — OSMOLITE 1.5 CAL PO LIQD
1000.0000 mL | ORAL | Status: DC
  Administered 2022-12-29 – 2023-01-01 (×4): 1000 mL
  Filled 2022-12-27 (×5): qty 1000

## 2022-12-27 MED ORDER — RENA-VITE PO TABS
1.0000 | ORAL_TABLET | Freq: Every day | ORAL | Status: DC
  Administered 2022-12-27 – 2023-01-20 (×25): 1 via ORAL
  Filled 2022-12-27 (×25): qty 1

## 2022-12-27 MED ORDER — VANCOMYCIN HCL IN DEXTROSE 1-5 GM/200ML-% IV SOLN
1000.0000 mg | Freq: Once | INTRAVENOUS | Status: AC
  Administered 2022-12-27: 1000 mg via INTRAVENOUS
  Filled 2022-12-27: qty 200

## 2022-12-27 MED ORDER — ENSURE ENLIVE PO LIQD: 237.0000 mL | Freq: Three times a day (TID) | ORAL | Status: DC

## 2022-12-27 MED ORDER — OSMOLITE 1.5 CAL PO LIQD
1000.0000 mL | ORAL | Status: AC
  Administered 2022-12-28: 1000 mL
  Filled 2022-12-27: qty 1000

## 2022-12-27 MED ORDER — JUVEN PO PACK
1.0000 | PACK | Freq: Two times a day (BID) | ORAL | Status: DC
  Administered 2022-12-28 – 2023-01-02 (×9): 1
  Filled 2022-12-27 (×9): qty 1

## 2022-12-27 MED ORDER — OSMOLITE 1.5 CAL PO LIQD
1000.0000 mL | ORAL | Status: AC
  Administered 2022-12-27: 1000 mL
  Filled 2022-12-27: qty 1000

## 2022-12-27 MED ORDER — PROSOURCE TF20 ENFIT COMPATIBL EN LIQD
60.0000 mL | Freq: Two times a day (BID) | ENTERAL | Status: DC
  Administered 2022-12-27 – 2023-01-01 (×11): 60 mL
  Filled 2022-12-27 (×11): qty 60

## 2022-12-27 MED ORDER — VANCOMYCIN HCL IN DEXTROSE 1-5 GM/200ML-% IV SOLN
1000.0000 mg | INTRAVENOUS | Status: DC
  Filled 2022-12-27: qty 200

## 2022-12-27 MED ORDER — ENSURE ENLIVE PO LIQD
237.0000 mL | Freq: Three times a day (TID) | ORAL | Status: DC
  Administered 2022-12-28 – 2023-01-21 (×50): 237 mL via ORAL

## 2022-12-27 MED ORDER — HEPARIN SODIUM (PORCINE) 1000 UNIT/ML IJ SOLN
4000.0000 [IU] | Freq: Once | INTRAMUSCULAR | Status: AC
  Administered 2022-12-27: 4000 [IU] via INTRAVENOUS

## 2022-12-27 NOTE — Progress Notes (Signed)
8 Days Post-Op   Subjective/Chief Complaint: Reports sacral discomfort, improved with PO meds. Denies abdominal pain.  Has been eating some with no acute issues.    Objective: Vital signs in last 24 hours: Temp:  [98.2 F (36.8 C)-100.3 F (37.9 C)] 98.2 F (36.8 C) (01/05 0810) Pulse Rate:  [44-97] 78 (01/05 1000) Resp:  [13-20] 13 (01/05 1033) BP: (111-167)/(50-84) 166/84 (01/05 1100) SpO2:  [98 %-100 %] 100 % (01/05 1000) Last BM Date : 12/23/22  Intake/Output from previous day: 01/04 0701 - 01/05 0700 In: 663 [P.O.:360; I.V.:3; IV Piggyback:300] Out: -  Intake/Output this shift: No intake/output data recorded.  PE: Abd: soft, appropriately tender around incisions as expected, ND, incisions c/d/I Wound: from exam yesterday: appx 6cm diameter unstageable decubitus wound with tunneling superiorly about 3cm. Malodorous fibrinous slough in the wound bed but no palpable fluctuance or surrounding induration.   Lab Results:  Recent Labs    12/25/22 0417 12/27/22 0336  WBC 11.6* 10.4  HGB 7.7* 8.8*  HCT 25.7* 28.1*  PLT 314 329   BMET Recent Labs    12/26/22 0148 12/27/22 0336  NA 134* 133*  K 4.8 4.8  CL 94* 97*  CO2 27 26  GLUCOSE 121* 119*  BUN 18 33*  CREATININE 2.66* 3.33*  CALCIUM 9.0 9.5   PT/INR No results for input(s): "LABPROT", "INR" in the last 72 hours. ABG No results for input(s): "PHART", "HCO3" in the last 72 hours.  Invalid input(s): "PCO2", "PO2"  Studies/Results: CT ABDOMEN PELVIS WO CONTRAST  Addendum Date: 12/26/2022   ADDENDUM REPORT: 12/26/2022 23:38 ADDENDUM: Critical Value/emergent results were called by telephone at the time of interpretation on 12/26/2022 at 11:37 pm to provider Dr. Bridgett Larsson, who verbally acknowledged these results. Electronically Signed   By: Brett Fairy M.D.   On: 12/26/2022 23:38   Result Date: 12/26/2022 CLINICAL DATA:  Sepsis, sacral ulcer.  Concern for osteomyelitis. EXAM: CT ABDOMEN AND PELVIS WITHOUT CONTRAST  TECHNIQUE: Multidetector CT imaging of the abdomen and pelvis was performed following the standard protocol without IV contrast. RADIATION DOSE REDUCTION: This exam was performed according to the departmental dose-optimization program which includes automated exposure control, adjustment of the mA and/or kV according to patient size and/or use of iterative reconstruction technique. COMPARISON:  11/04/2022. FINDINGS: Lower chest: The heart is normal in size and there is no pericardial effusion. Multi-vessel coronary artery calcifications are noted. The distal tip of a right central venous catheter terminates in the right atrium. Patchy atelectasis or infiltrate is present at the lung bases. Hepatobiliary: Scattered hypodensities are present in the liver which are unchanged from the prior exam. The gallbladder is surgically absent which is new from the previous exam. No biliary ductal dilatation. Pancreas: Pancreatic atrophy is noted. No pancreatic ductal dilatation or surrounding inflammatory changes. Spleen: Normal in size without focal abnormality. Adrenals/Urinary Tract: No adrenal nodule or mass. Renal cysts are present bilaterally. Renal calculi are noted bilaterally. No hydronephrosis. The bladder is unremarkable. Stomach/Bowel: Stomach is within normal limits. Appendix is not seen. No evidence of bowel wall thickening, distention, or inflammatory changes. Free air is noted in the upper abdomen. Moderate amount of retained stool is present in the colon. Vascular/Lymphatic: Aortic atherosclerosis. No enlarged abdominal or pelvic lymph nodes. Reproductive: The prostate gland is enlarged. Other: No abdominopelvic ascites. There is a fat containing umbilical hernia is noted. A focus of air is noted in the anterior abdominal wall in the region of the umbilicus. Musculoskeletal: Degenerative changes in the  thoracolumbar spine. A neurostimulator device is noted in the subcutaneous tissues of the right flank with leads  terminating in the thoracic spinal canal. Lumbar spinal fusion hardware is present from L2-L5. There is fat stranding and increased density in the subcutaneous tissues overlying the distal sacrum and coccyx on the left. There is a complex collection containing air in the subcutaneous tissues posterior to the sacrum on the left measuring 2.5 x 1.2 cm. No obvious bony erosions or periosteal elevation in this region. IMPRESSION: 1. Small amount of free air in the upper abdomen with cholecystectomy changes, which may be due to recent surgery. Clinical correlation is recommended. 2. Subcutaneous fat stranding with complex collection posterior to the sacrum on the left measuring 2.5 x 1.2 cm, suspicious for abscess formation. No definite evidence of osteomyelitis. 3. Patchy airspace disease at the lung bases, possible atelectasis or infiltrate. 4. Moderate amount of retained stool in the colon suggesting constipation. 5. Aortic atherosclerosis. 6. Remaining incidental findings as described above. Electronically Signed: By: Brett Fairy M.D. On: 12/26/2022 23:30    Anti-infectives: Anti-infectives (From admission, onward)    Start     Dose/Rate Route Frequency Ordered Stop   12/27/22 1200  vancomycin (VANCOCIN) IVPB 1000 mg/200 mL premix        1,000 mg 200 mL/hr over 60 Minutes Intravenous Every M-W-F (Hemodialysis) 12/25/22 1831     12/26/22 2100  ceFEPIme (MAXIPIME) 1 g in sodium chloride 0.9 % 100 mL IVPB        1 g 200 mL/hr over 30 Minutes Intravenous Every 24 hours 12/26/22 1052     12/25/22 1900  ceFEPIme (MAXIPIME) 1 g in sodium chloride 0.9 % 100 mL IVPB  Status:  Discontinued        1 g 200 mL/hr over 30 Minutes Intravenous Every 24 hours 12/25/22 1812 12/26/22 1052   12/25/22 1900  vancomycin (VANCOREADY) IVPB 1750 mg/350 mL        1,750 mg 175 mL/hr over 120 Minutes Intravenous  Once 12/25/22 1812 12/26/22 0146   12/25/22 1900  metroNIDAZOLE (FLAGYL) IVPB 500 mg        500 mg 100 mL/hr  over 60 Minutes Intravenous Every 12 hours 12/25/22 1812     12/13/22 0600  cefoTEtan (CEFOTAN) 2 g in sodium chloride 0.9 % 100 mL IVPB  Status:  Discontinued        2 g 200 mL/hr over 30 Minutes Intravenous On call to O.R. 12/12/22 1631 12/14/22 0559   12/12/22 0615  cefoTEtan (CEFOTAN) 2 g in sodium chloride 0.9 % 100 mL IVPB  Status:  Discontinued        2 g 200 mL/hr over 30 Minutes Intravenous On call to O.R. 12/12/22 6314 12/12/22 0913       Assessment/Plan: s/p Procedure(s): LAPAROSCOPIC CHOLECYSTECTOMY (N/A)  Decubitus wound. Recommend continued local wound care/ hydrotherapy, offloading and specialized mattress, maximize nutrition. Would not recommend surgical debridement at this time.  -CT scan reviewed.  They mention a small complex collection.  This is c/w packing and opening of wound and not with abscess on physical exam.   -cont above recommendations for wound with no surgical needs at this time  -CT also noted some free air, s/p lap chole.  The patient is AF with no abdominal pain that is concerning and more than expected for 1 week post op.  He is eating with bowel function.  Suspect this air is just delayed reabsorption.  No concerns for post op complication.  -  discussed with primary service.  We will sign off.  Please call if we can be of further assistance.   LOS: 14 days    Henreitta Cea PA-C 12/27/2022

## 2022-12-27 NOTE — Progress Notes (Signed)
Physical Therapy Wound Treatment Patient Details  Name: [name] MRN: [id number] Date of Birth: 1946/02/18  Today's Date: 12/27/2022 Time: 1400-1445 Time Calculation (min): 45 min  Subjective  Subjective Assessment Subjective: Pt pleasant and agreeable to wound therapy - wants to eat lunch. Patient and Family Stated Goals: have less pain Date of Onset:  (pt states about 4 months) Prior Treatments: medihoney  Pain Score:  Pt premedicated, complains of pain during treatment.   Wound Assessment  Pressure Injury 11/06/22 Sacrum Mid Unstageable - Full thickness tissue loss in which the base of the injury is covered by slough (yellow, tan, gray, green or brown) and/or eschar (tan, brown or black) in the wound bed. reddended area to the sacruum with (Active)  Dressing Type Foam - Lift dressing to assess site every shift;Gauze (Comment);Dakin's-soaked gauze;Moist to moist 12/27/22 1514  Dressing Clean, Dry, Intact 12/27/22 1514  Dressing Change Frequency Daily 12/27/22 1514  State of Healing Non-healing 12/27/22 1514  Site / Wound Assessment Painful 12/27/22 1514  % Wound base Red or Granulating 0% 12/27/22 1514  % Wound base Yellow/Fibrinous Exudate 75% 12/27/22 1514  % Wound base Black/Eschar 25% 12/27/22 1514  % Wound base Other/Granulation Tissue (Comment) 0% 12/27/22 1514  Peri-wound Assessment Intact 12/27/22 1514  Wound Length (cm) 3.2 cm 12/25/22 1400  Wound Width (cm) 3.4 cm 12/25/22 1400  Wound Depth (cm) 2.8 cm 12/25/22 1400  Wound Surface Area (cm^2) 10.88 cm^2 12/25/22 1400  Wound Volume (cm^3) 30.46 cm^3 12/25/22 1400  Tunneling (cm) Y 12/17/22 0100  Undermining (cm) Undermined from 7 o'clock to 1 o'clock 2 cm 7-12 1.5 cm 12-1 12/25/22 1400  Margins Unattached edges (unapproximated) 12/27/22 1514  Drainage Amount Moderate 12/27/22 1514  Drainage Description Serosanguineous 12/27/22 1514  Treatment Debridement (Selective);Irrigation;Other (Comment) 12/27/22 1514       Selective Debridement (non-excisional) Selective Debridement (non-excisional) - Location: Sacrum Selective Debridement (non-excisional) - Tools Used: Forceps, Scissors Selective Debridement (non-excisional) - Tissue Removed: gray black fibrinous necrotic tissue    Wound Assessment and Plan  Wound Therapy - Assess/Plan/Recommendations Wound Therapy - Clinical Statement: Odor appears improved this session since initiating Dakin's solution. Pt was able to tolerate the treatment well initially however pain limited amount of debridement able to be completed. This patient will benefit from continued wound therapy for selective removal of unviable tissue, to decrease bioburden, and promote wound bed healing.  Wound Therapy - Functional Problem List: decreased mobility Factors Delaying/Impairing Wound Healing: Diabetes Mellitus, Incontinence, Immobility, Multiple medical problems Hydrotherapy Plan: Debridement, Patient/family education, Dressing change Wound Therapy - Frequency:  (2x/wk) Wound Therapy - Current Recommendations: WOC nurse Wound Therapy - Follow Up Recommendations: dressing changes by RN  Wound Therapy Goals- Improve the function of patient's integumentary system by progressing the wound(s) through the phases of wound healing (inflammation - proliferation - remodeling) by: Wound Therapy Goals - Improve the function of patient's integumentary system by progressing the wound(s) through the phases of wound healing by: Decrease Necrotic Tissue to: 20% Decrease Necrotic Tissue - Progress: Goal set today Increase Granulation Tissue to: 80% Increase Granulation Tissue - Progress: Goal set today Goals/treatment plan/discharge plan were made with and agreed upon by patient/family: Yes Time For Goal Achievement: 7 days Wound Therapy - Potential for Goals: Fair  Goals will be updated until maximal potential achieved or discharge criteria met.  Discharge criteria: when goals achieved,  discharge from hospital, MD decision/surgical intervention, no progress towards goals, refusal/missing three consecutive treatments without notification or medical reason.  GP  Charges PT Wound Care Charges $Wound Debridement up to 20 cm: < or equal to 20 cm $PT Hydrotherapy Dressing: 2 dressings $PT Hydrotherapy Visit: 1 Visit       Thelma Comp 12/27/2022, 3:21 PM  Rolinda Roan, PT, DPT Acute Rehabilitation Services Secure Chat Preferred Office: [phone]

## 2022-12-27 NOTE — TOC Progression Note (Signed)
Transition of Care [facility]) - Progression Note    Patient Details  Name: [name] MRN: [id number] Date of Birth: 10-03-1946  Transition of Care [facility]) CM/SW [place], [place] Phone Number: 12/27/2022, 2:03 PM  Clinical Narrative:     Patient has SNF bed at [facility] when medically ready for dc. CSW following to restart insurance authorization close to patient being medically ready for dc. CSW will continue to follow and assist with patients dc planning needs.  Expected Discharge Plan: [place] Barriers to Discharge: Continued Medical Work up  Expected Discharge Plan and Services In-house Referral: Clinical Social Work   Post Acute Care Choice: [place] Living arrangements for the past 2 months: [place] Determinants of Health (SDOH) Interventions SDOH Screenings   Food Insecurity: No Food Insecurity (09/13/2022)  Housing: Low Risk  (12/17/2022)  Transportation Needs: No Transportation Needs (12/17/2022)  Utilities: Not At Risk (12/17/2022)  Tobacco Use: Medium Risk (12/20/2022)    Readmission Risk Interventions    09/16/2022   12:31 PM 08/02/2022    2:23 PM  Readmission Risk Prevention Plan  Transportation Screening Complete Complete  Medication Review (RN Care Manager) Complete Complete  HRI or Home Care Consult Complete Complete  SW Recovery Care/Counseling Consult Complete Complete  Palliative Care Screening Not Applicable Not Applicable  Skilled Nursing Facility Not Applicable Complete

## 2022-12-27 NOTE — Progress Notes (Addendum)
[name] Progress Note   Subjective: Seen in HD unit. Tolerating UF. System clotted today so restarted tmt. Denies cp, dyspnea. Says he's hungry    Objective Vitals:   12/27/22 1000 12/27/22 1033 12/27/22 1100 12/27/22 1116  BP: 124/68 (!) 167/75 (!) 166/84 (!) 171/83  Pulse: 78   68  Resp: '17 13  14  '$ Temp:      TempSrc:      SpO2: 100%     Weight:      Height:          Additional Objective Labs: Basic Metabolic Panel: Recent Labs  Lab 12/25/22 0159 12/26/22 0148 12/27/22 0336  NA 136 134* 133*  K 3.8 4.8 4.8  CL 100 94* 97*  CO2 '27 27 26  '$ GLUCOSE 151* 121* 119*  BUN 29* 18 33*  CREATININE 3.38* 2.66* 3.33*  CALCIUM 9.2 9.0 9.5  PHOS 2.9 3.1 3.7    CBC: Recent Labs  Lab 12/21/22 0145 12/22/22 0122 12/23/22 0140 12/24/22 0239 12/25/22 0417 12/27/22 0336  WBC 8.5 8.4 8.3 8.9 11.6* 10.4  NEUTROABS 5.2  --   --   --   --   --   HGB 7.7* 8.1* 8.1* 8.0* 7.7* 8.8*  HCT 24.8* 26.2* 26.2* 25.7* 25.7* 28.1*  MCV 102.1* 102.7* 102.3* 102.4* 104.0* 101.8*  PLT 206 224 236 266 314 329    Blood Culture    Component Value Date/Time   SDES WOUND 12/25/2022 1715   SPECREQUEST NONE 12/25/2022 1715   CULT  12/25/2022 1715    FEW GRAM NEGATIVE RODS MODERATE PROTEUS MIRABILIS SUSCEPTIBILITIES TO FOLLOW CULTURE REINCUBATED FOR BETTER GROWTH Performed at [facility], [place] [address], [place], [place] [address]    REPTSTATUS PENDING 12/25/2022 1715     Physical Exam General: Elderly man lying on side Heart: RRR Lungs: Clear bilaterally Abdomen: soft non-tender Extremities: No edema Dialysis Access: R TDC   Medications:  ceFEPime (MAXIPIME) IV Stopped (12/27/22 0113)   metronidazole Stopped (12/27/22 0248)   vancomycin      allopurinol  100 mg Oral Daily   aspirin EC  81 mg Oral Daily   atorvastatin  40 mg Oral Daily   Chlorhexidine Gluconate Cloth  6 each Topical Q0600   cycloSPORINE  1 drop Both Eyes BID   darbepoetin  (ARANESP) injection - DIALYSIS  200 mcg Subcutaneous Q Fri-1800   feeding supplement  237 mL Oral BID BM   Gerhardt's butt cream   Topical BID   heparin  5,000 Units Subcutaneous Q8H   isosorbide dinitrate  5 mg Oral BID   melatonin  4.5 mg Oral QHS   polyethylene glycol  17 g Oral Daily   senna-docusate  1 tablet Oral BID   sodium chloride flush  3 mL Intravenous Q12H   sodium hypochlorite   Irrigation Daily    Dialysis Orders: [facility] MWF 4h  400/800  84.5kg  2/2.5 bath   Heparin 4000 units IV  RIJ TDC - last OP HD 12/20, post wt 85.9kg - last IP hD 12/24, post wt 85kg - mircera 225 mcg IV q2, last 12/18, due 1/02 - no vdra   Assessment/Plan: Chronic cholecystitis - w/ nausea, malnutrition, FTT.  S/p laparoscopic cholecystectomy  Sacral decubitus ulcer - wound culture following; On IV antibiotics per primary  Chronic HFrEF - LVEF up to 50-55% here, diffuse CAD.  ESRD - on HD MWF. Continue on schedule. Next HD 1/5 HTN/ volume -BP stable. No volume exces UF  as tolerated. 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 Aranesp 200 q Friday. Tsat low but Ferritin high. MBD ckd - CCa and phos controlled, not on a binder and not on VDRA as OP DM2 - per pmd   Nutrition - PCM, FTT. On regular diet + protein supp. Encouraged PO intake.  Medication - Gabapentin dose inappropriate for ESRD pt. -Unclear indication -will d/c for now. Can resume at 300 mg q day if needed.   [name] Child PA-C [name] 12/27/2022,11:20 AM    Seen and examined independently.  Agree with note and exam as documented above by physician extender and as noted here.  Please see my procedure note from today  Claudia Desanctis, MD 12/27/2022  6:27 PM

## 2022-12-27 NOTE — Progress Notes (Signed)
Pharmacy Antibiotic Note  [name] is a 77 y.o. male admitted on 12/12/2022 with  nausea .  Noted to have foul smelling unstageable sacral ulcer that has progressed compared to previous imaging 12/21. Patient developed a fever after hydrotherapy on 1/3. Pharmacy has been consulted for cefepime/flagyl and vancomycin dosing.  Pt is ESRD on HD MWF. CT scan without abscess. Wound culture with proteus and enterococcus, sensis pending. Last HD was today 1/5.  Plan: Vancomycin '1000mg'$  IV qHD session Cefepime 1g IV q24 hours Flagyl '500mg'$  IV q12 hours F/u blood and wound cultures, vanc levels as needed    Height: '6\' 3"'$  (190.5 cm) Weight:  (unable to obtain) IBW/kg (Calculated) : 84.5  Temp (24hrs), Avg:98.6 F (37 C), Min:97.8 F (36.6 C), Max:100.3 F (37.9 C)  Recent Labs  Lab 12/22/22 0122 12/23/22 0140 12/24/22 0239 12/25/22 0159 12/25/22 0417 12/26/22 0148 12/27/22 0336  WBC 8.4 8.3 8.9  --  11.6*  --  10.4  CREATININE 3.30* 2.35* 3.04* 3.38*  --  2.66* 3.33*     Estimated Creatinine Clearance: 21.9 mL/min (A) (by C-G formula based on SCr of 3.33 mg/dL (H)).    Allergies  Allergen Reactions   Metformin Nausea And Vomiting   Niacin    Niacin And Related Other (See Comments)    Reaction: flushing and burning side effects even with extended release    Antimicrobials this admission: Vancomycin 1/3 >>  Cefepime 1/3 >>  Flagyl 1/3 >>  Dose adjustments this admission:  Microbiology results: 1/3 BCx: NGTD 1/3 Wound Cx: proteus, enterococcus 12/19 MRSA PCR: not detected   Thank you for allowing pharmacy to be a part of this patient's care.  Arrie Senate, PharmD, BCPS, [facility] Clinical Pharmacist [phone] Please check AMION for all [place] numbers 12/27/2022

## 2022-12-27 NOTE — Progress Notes (Signed)
Received patient in bed to unit.  Alert and oriented.  Informed consent signed and in chart.   Treatment initiated: 08:10 Treatment completed: 13:22  Patient tolerated well.  Transported back to the room  Alert, without acute distress.  Hand-off given to patient's nurse.   Access used: Right hemodialysis catheter Access issues: none  Total UF removed: 1.5L Medication(s) given: oxy '5mg'$     12/27/22 1322  Vitals  Temp 97.8 F (36.6 C)  BP (!) 148/86  MAP (mmHg) 95  BP Location Left Wrist  BP Method Automatic  Patient Position (if appropriate) Lying  Pulse Rate 80  Pulse Rate Source Monitor  ECG Heart Rate 84  Resp 16  Oxygen Therapy  SpO2 100 %  O2 Device Room Air  Patient Activity (if Appropriate) In bed  Pulse Oximetry Type Continuous  During Treatment Monitoring  HD Safety Checks Performed Yes  Intra-Hemodialysis Comments Tolerated well;Tx completed  Dialysis Fluid Bolus Normal Saline  Bolus Amount (mL) 300 mL      Daryan Cagley S [facility]

## 2022-12-27 NOTE — Progress Notes (Signed)
Tx resumed after setting machine back up, RN aware

## 2022-12-27 NOTE — Procedures (Signed)
Cortrak  Person Inserting Tube:  Maylon Peppers C, RD Tube Type:  Cortrak - 43 inches Tube Size:  10 Tube Location:  Left nare Secured by: Bridle Technique Used to Measure Tube Placement:  Marking at nare/corner of mouth Cortrak Secured At:  80 cm   Cortrak Tube Team Note:  Consult received to place a Cortrak feeding tube.   X-ray is required, abdominal x-ray has been ordered by the Cortrak team. Please confirm tube placement before using the Cortrak tube.   If the tube becomes dislodged please keep the tube and contact the Cortrak team at [email or web address] for replacement.  If after hours and replacement cannot be delayed, place a NG tube and confirm placement with an abdominal x-ray.    Lockie Pares., RD, LDN, CNSC See AMiON for contact information

## 2022-12-27 NOTE — Progress Notes (Signed)
Nutrition Follow-up  DOCUMENTATION CODES:   Non-severe (moderate) malnutrition in context of chronic illness  INTERVENTION:  Initiate nocturnal TF regimen via Cortrak tube: -Initiate Osmolite 1.5 at 30 mL/hour x 12 hours overnight from 1800-0600 -Advance by 20 mL/hour nightly -Goal nocturnal regimen is Osmolite 1.5 at 70 mL/hour x 12 hours overnight from 1800-0600 -Provide PROSource TF20 60 mL BID per Cortrak tube -Goal regimen provides: 1420 kcal, 93 grams of protein, 638 mL H2O daily -Meets 62% minimum estimated kcal needs and 78% minimum estimated protein needs  Monitor magnesium, potassium, and phosphorus daily for at least 3 days, MD to replete as needed, as pt is at risk for refeeding syndrome.  Provide Juven 1 packet per tube BID. Each packet provides 95 kcal, 7 grams L-Arginine, 7 grams L-Glutamine, 2.5 grams collagen protein, 300 mg vitamin C, 9.5 mg zinc, and other micronutrients essential for wound healing.  Continue liberalized, regular diet. Provide 1:1 assistance at meals.   Increase to Ensure Enlive po TID, each supplement provides 350 kcal and 20 grams of protein.  Provide Rena-vite po QHS.  NUTRITION DIAGNOSIS:   Moderate Malnutrition related to chronic illness (ESRD on HD, chronic cholecystitis) as evidenced by moderate fat depletion, moderate muscle depletion, 15% weight loss over the past 5 months.  Updated nutrition diagnosis.  GOAL:   Patient will meet greater than or equal to 90% of their needs  Progressing with initiation of enteral nutrition.  MONITOR:   PO intake, Supplement acceptance, Labs, Weight trends, TF tolerance, I & O's, Skin  REASON FOR ASSESSMENT:   Malnutrition Screening Tool    ASSESSMENT:   77 year old male with PMHx of ESRD on HD M/W/F, HFrEF, DM type 2, HTN, chronic cholecystitis s/p cholecystostomy tube placement that was later dislodged who presented with nausea. Pt underwent cholecystectomy on 12/28 with intraoperative  cholangiogram. Pt also with unstageable sacral ulcer that progressed as compared to previous imaging 12/21; no debridement recommended by general surgery.  12/28: s/p laparoscopic cholecystectomy with intraoperative ICG cholangiography   Pt underwent HD today with 1500 mL fluid removed.  Met with patient at bedside after he had returned from HD. He reports his appetite has been decreased. Noted that prior to cholecystectomy intake varied between 25-100% (average 67%). Since operation, only documentation in chart is 50% of dinner on 12/29 and 20% of lunch on 1/3.   Per review of chart pt was 98.1 kg on 08/23/22. Most recent wt is 83.4 kg on 12/25/22. Pt has lost 14.7 kg or 15% weight loss over the past 4 months, which is significant for time frame.  Enteral Access: 10 Fr. Cortrak tube placed 12/27/22; 80 cm at left nare secured with bridle; tip in expected region of the stomach per abdominal x-ray 1/5  UOP: none documented in the past 24 hrs  I/O: -1234 mL since 12/13/22  Medications reviewed and include: allopurinol, Miralax, senna-docusate 1 tablet BID, cefepime, Flagyl, vancomycin.  Labs reviewed: CBG 111-145 on 1/4; Sodium 133, Chloride 97, BUN 33, Creatinine 3.33.  Discussed with RN and NT at bedside. Patient has had poor oral intake since operation on 12/28. Yesterday he ate 0% of meals but had 1 bottle of Ensure. He did not have breakfast as he went to HD, but came back hungry form HD. NT was assisting patient with eating lunch at time of RD assessment.   Noted in chart that pt has SNF bed available once medically stable for discharge. Discussed with MD via secure chat. Plan is for  possible discharge early next week. Plan is still to proceed with placement of Cortrak tube today with plan to provide nocturnal tube feeds so patient can continue to try to eat by mouth during the day. Plan is to reassess plan early next week.  Although pt has had significant weight loss, findings from NFPE show  moderate fat depletion and mild-moderate muscle depletion. Therefore, pt meets criteria for moderate malnutrition at this time.  NUTRITION - FOCUSED PHYSICAL EXAM: Completed on 12/25/22 Flowsheet Row Most Recent Value  Orbital Region Moderate depletion  Upper Arm Region Moderate depletion  Thoracic and Lumbar Region Unable to assess  Buccal Region Moderate depletion  Temple Region Moderate depletion  Clavicle Bone Region Moderate depletion  Clavicle and Acromion Bone Region Moderate depletion  Scapular Bone Region Unable to assess  Dorsal Hand Moderate depletion  Patellar Region Mild depletion  Anterior Thigh Region Mild depletion  Posterior Calf Region Mild depletion  Edema (RD Assessment) None  Hair Unable to assess  Eyes Reviewed  Mouth Reviewed  Skin Reviewed  Nails Reviewed       Diet Order:   Diet Order             Diet regular Room service appropriate? Yes; Fluid consistency: Thin  Diet effective now                  EDUCATION NEEDS:   Not appropriate for education at this time  Skin:  Skin Assessment: Skin Integrity Issues: Skin Integrity Issues:: Unstageable, Incisions Unstageable: mid sacrum (3.2 cm x 3.4 cm x 2.8 cm) Incisions: closed incision to abdomen  Last BM:  12/23/22 per chart (no stool characteristics available)  Height:   Ht Readings from Last 1 Encounters:  12/17/22 '6\' 3"'$  (1.905 m)   Weight:   Wt Readings from Last 1 Encounters:  12/10/22 87.1 kg   Ideal Body Weight:  89.1 kg  BMI:  Body mass index is 22.98 kg/m.  Estimated Nutritional Needs:   Kcal:  2300-2500  Protein:  120-130 grams  Fluid:  UOP + 1 L  Lenya Sterne Magda Paganini, MS, RD, LDN, CNSC Pager number available on Amion

## 2022-12-27 NOTE — Progress Notes (Signed)
System clotted, had to restring machine, RN aware

## 2022-12-27 NOTE — Progress Notes (Signed)
Called to get report for pt to come to hemodialysis [facility], RN stated she will call me back for report.

## 2022-12-27 NOTE — Procedures (Signed)
Seen and examined on dialysis.  Blood pressure 124/68 and HR 70.  RIJ tunn catheter in use. Tolerating goal.  Procedure supervised.     Claudia Desanctis, MD 12/27/2022 10:17 AM

## 2022-12-27 NOTE — Progress Notes (Signed)
[facility]  PROGRESS NOTE  [name] [id number]:[id number] DOB: February 12, 1946 DOA: 12/12/2022 PCP: Redmond School, MD   Brief HPI:   77 year old male with past medical history of ESRD on hemodialysis Monday Wednesday Friday, HFrEF, diabetes mellitus type 2, hypertension who presented with nausea.  Patient has known history of cholecystitis and underwent cholecystostomy tube placement.  His tube had become dislodged and he underwent evaluation with surgery.  He was noted to have recanalization of the bile duct therefore tube was not replaced.  He came to hospital for elective cholecystectomy underwent preop evaluation with cardiology.  His workup was notable for diffuse T wave inversions and prolonged QTc.  Also was found to be significantly hypokalemic.  He was started on electrolyte replacement.  Nephrology was consulted for chronic hemodialysis during hospitalization.  Despite improvement of Aulik parameters, ECG changes persisted so he was transferred to [facility] for cardiac catheterization on 12/17/2022.    Subjective   Patient seen during hemodialysis.  CT abdomen/pelvis obtained yesterday showed small pneumoperitoneum and possible abscess at the site of sacral decubitus ulcer.   Assessment/Plan:     Chronic cholecystitis -Presented with malnutrition, failure to thrive -General surgery was consulted, underwent cholecystectomy on 12/28 with intraoperative cholangiogram  -Per general surgery; pneumoperitoneum is likely from lap chole done last week -No further intervention recommended  Chronic HFrEF, abnormal ECG -Repeat echo during this admission showed EF of 50 to 55% and 35 to 40% in August 2023 -Continue cardiac monitoring -Left heart cath on 12/26 showed multivessel diffuse moderate CAD with no acute or obvious flow-limiting lesions(in absence of any active anginal symptoms, would not PCI in preop setting)  -Medical management recommended, okay to proceed with surgery as per  cardiology -Started on high-dose statin  ESRD -Nephrology consulted for hemodialysis  Anemia of ESRD -ESA per nephrology -S/p 1 unit PRBC during this hospitalization  Unstageable sacral ulcer -Foul-smelling decubitus wound noted which progressed as compared to previous imaging from 12/21 -Wound care consulted -Today after hydrotherapy patient developed fever with temperature 101 F -Blood cultures x 2 obtained, wound culture obtained -Started on empiric vancomycin and Zosyn -Follow culture results -sacral MRI ordered, 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 -Continue hydrotherapy and local wound care, no intervention recommended per general surgery   Diabetes mellitus type 2 A1c 4.4 (likely reflecting his recent failure to thrive, poor PO intake) D/c SSI   Functional Quadriplegia  Essentially bedbound for the past year (needs help toileting, turning in bed, dressing, etc) -Unclear cause Follow outpatient  Protein calorie malnutrition -Patient has poor p.o. intake -BMI 22.98 kg/m -Will place core track feeding tube; additional nutrition should help heal sacral ulcer  Medications     allopurinol  100 mg Oral Daily   aspirin EC  81 mg Oral Daily   atorvastatin  40 mg Oral Daily   Chlorhexidine Gluconate Cloth  6 each Topical Q0600   cycloSPORINE  1 drop Both Eyes BID   darbepoetin (ARANESP) injection - DIALYSIS  200 mcg Subcutaneous Q Fri-1800   feeding supplement  237 mL Oral TID BM   feeding supplement (OSMOLITE 1.5 CAL)  1,000 mL Per Tube Q24H   [START ON 12/28/2022] feeding supplement (OSMOLITE 1.5 CAL)  1,000 mL Per Tube Q24H   [START ON 12/29/2022] feeding supplement (OSMOLITE 1.5 CAL)  1,000 mL Per Tube Q24H   feeding  supplement (PROSource TF20)  60 mL Per Tube BID   Gerhardt's butt cream   Topical BID   heparin  5,000 Units Subcutaneous Q8H    isosorbide dinitrate  5 mg Oral BID   melatonin  4.5 mg Oral QHS   polyethylene glycol  17 g Oral Daily   senna-docusate  1 tablet Oral BID   sodium chloride flush  3 mL Intravenous Q12H   sodium hypochlorite   Irrigation Daily     Data Reviewed:   CBG:  Recent Labs  Lab 12/22/22 2106 12/23/22 0801 12/26/22 0815 12/26/22 1155 12/26/22 1612  GLUCAP 133* 106* 111* 115* 145*    SpO2: 100 % O2 Flow Rate (L/min): 3 L/min    Vitals:   12/27/22 1300 12/27/22 1322 12/27/22 1330 12/27/22 1350  BP: (!) 183/80 (!) 148/86 (!) 167/90 (!) 143/86  Pulse:  80 81 88  Resp: '13 16 15 12  '$ Temp:  97.8 F (36.6 C)    TempSrc:      SpO2: 100% 100% 100% 100%  Weight:      Height:          Data Reviewed:  Basic Metabolic Panel: Recent Labs  Lab 12/21/22 0145 12/22/22 0122 12/23/22 0140 12/24/22 0239 12/25/22 0159 12/26/22 0148 12/27/22 0336  NA 137 135 133* 137 136 134* 133*  K 3.6 3.4* 3.4* 3.8 3.8 4.8 4.8  CL 98 100 97* 97* 100 94* 97*  CO2 '28 25 26 27 27 27 26  '$ GLUCOSE 82 105* 120* 107* 151* 121* 119*  BUN '11 16 11 20 '$ 29* 18 33*  CREATININE 2.54* 3.30* 2.35* 3.04* 3.38* 2.66* 3.33*  CALCIUM 8.8* 8.9 8.6* 9.3 9.2 9.0 9.5  MG 1.6* 1.6*  --   --   --   --   --   PHOS 3.1 2.9 2.1* 2.8 2.9 3.1 3.7    CBC: Recent Labs  Lab 12/21/22 0145 12/22/22 0122 12/23/22 0140 12/24/22 0239 12/25/22 0417 12/27/22 0336  WBC 8.5 8.4 8.3 8.9 11.6* 10.4  NEUTROABS 5.2  --   --   --   --   --   HGB 7.7* 8.1* 8.1* 8.0* 7.7* 8.8*  HCT 24.8* 26.2* 26.2* 25.7* 25.7* 28.1*  MCV 102.1* 102.7* 102.3* 102.4* 104.0* 101.8*  PLT 206 224 236 266 314 329    LFT Recent Labs  Lab 12/22/22 0122 12/23/22 0140 12/24/22 0239 12/25/22 0159 12/26/22 0148 12/27/22 0336  AST 21  --   --   --   --   --   ALT <5  --   --   --   --   --   ALKPHOS 47  --   --   --   --   --   BILITOT <0.1*  --   --   --   --   --   PROT 6.5  --   --   --   --   --   ALBUMIN 2.2* 2.2* 2.2* 2.2* 2.1* 2.2*      Antibiotics: Anti-infectives (From admission, onward)    Start     Dose/Rate Route Frequency Ordered Stop   12/28/22 0000  vancomycin (VANCOCIN) IVPB 1000 mg/200 mL premix        1,000 mg 200 mL/hr over 60 Minutes Intravenous Every M-W-F (Hemodialysis) 12/27/22 1354     12/27/22 1445  vancomycin (VANCOCIN) IVPB 1000 mg/200 mL premix        1,000 mg 200 mL/hr over 60 Minutes Intravenous  Once 12/27/22 1354     12/27/22 1200  vancomycin (VANCOCIN) IVPB 1000 mg/200 mL premix  Status:  Discontinued        1,000 mg 200 mL/hr over 60 Minutes Intravenous Every M-W-F (Hemodialysis) 12/25/22 1831 12/27/22 1354   12/26/22 2100  ceFEPIme (MAXIPIME) 1 g in sodium chloride 0.9 % 100 mL IVPB        1 g 200 mL/hr over 30 Minutes Intravenous Every 24 hours 12/26/22 1052     12/25/22 1900  ceFEPIme (MAXIPIME) 1 g in sodium chloride 0.9 % 100 mL IVPB  Status:  Discontinued        1 g 200 mL/hr over 30 Minutes Intravenous Every 24 hours 12/25/22 1812 12/26/22 1052   12/25/22 1900  vancomycin (VANCOREADY) IVPB 1750 mg/350 mL        1,750 mg 175 mL/hr over 120 Minutes Intravenous  Once 12/25/22 1812 12/26/22 0146   12/25/22 1900  metroNIDAZOLE (FLAGYL) IVPB 500 mg        500 mg 100 mL/hr over 60 Minutes Intravenous Every 12 hours 12/25/22 1812     12/13/22 0600  cefoTEtan (CEFOTAN) 2 g in sodium chloride 0.9 % 100 mL IVPB  Status:  Discontinued        2 g 200 mL/hr over 30 Minutes Intravenous On call to O.R. 12/12/22 1631 12/14/22 0559   12/12/22 0615  cefoTEtan (CEFOTAN) 2 g in sodium chloride 0.9 % 100 mL IVPB  Status:  Discontinued        2 g 200 mL/hr over 30 Minutes Intravenous On call to O.R. 12/12/22 2482 12/12/22 0913        DVT prophylaxis: Heparin  Code Status: Full code  Family Communication: No family at bedside   CONSULTS General surgery   Objective    Physical Examination:  Appears in no acute distress S1-S2, regular Clear to auscultation bilaterally Abdomen is  soft, nontender, no organomegaly   Status is: Inpatient:      Pressure Injury 11/06/22 Sacrum Mid Unstageable - Full thickness tissue loss in which the base of the injury is covered by slough (yellow, tan, gray, green or brown) and/or eschar (tan, brown or black) in the wound bed. reddended area to the [place] with (Active)  11/06/22 1315  Location: Sacrum  Location Orientation: Mid  Staging: Unstageable - Full thickness tissue loss in which the base of the injury is covered by slough (yellow, tan, gray, green or brown) and/or eschar (tan, brown or black) in the wound bed.  Wound Description (Comments): reddended area to the sacruum with yellow eschar and foul smell  Present on Admission: Yes        Martin   [facility] If 7PM-7AM, please contact night-coverage at [email or web address], Office  [phone]   12/27/2022, 4:38 PM  LOS: 14 days

## 2022-12-28 DIAGNOSIS — N186 End stage renal disease: Secondary | ICD-10-CM | POA: Diagnosis not present

## 2022-12-28 DIAGNOSIS — D649 Anemia, unspecified: Secondary | ICD-10-CM | POA: Diagnosis not present

## 2022-12-28 DIAGNOSIS — K811 Chronic cholecystitis: Secondary | ICD-10-CM | POA: Diagnosis not present

## 2022-12-28 DIAGNOSIS — E1169 Type 2 diabetes mellitus with other specified complication: Secondary | ICD-10-CM | POA: Diagnosis not present

## 2022-12-28 LAB — RENAL FUNCTION PANEL
Albumin: 2.2 g/dL — ABNORMAL LOW (ref 3.5–5.0)
Anion gap: 7 (ref 5–15)
BUN: 32 mg/dL — ABNORMAL HIGH (ref 8–23)
CO2: 29 mmol/L (ref 22–32)
Calcium: 9 mg/dL (ref 8.9–10.3)
Chloride: 99 mmol/L (ref 98–111)
Creatinine, Ser: 2.59 mg/dL — ABNORMAL HIGH (ref 0.61–1.24)
GFR, Estimated: 25 mL/min — ABNORMAL LOW (ref >60)
Glucose, Bld: 201 mg/dL — ABNORMAL HIGH (ref 70–99)
Phosphorus: 1.5 mg/dL — ABNORMAL LOW (ref 2.5–4.6)
Potassium: 3.6 mmol/L (ref 3.5–5.1)
Sodium: 135 mmol/L (ref 135–145)

## 2022-12-28 LAB — GLUCOSE, CAPILLARY
Glucose-Capillary: 153 mg/dL — ABNORMAL HIGH (ref 70–99)
Glucose-Capillary: 159 mg/dL — ABNORMAL HIGH (ref 70–99)
Glucose-Capillary: 167 mg/dL — ABNORMAL HIGH (ref 70–99)
Glucose-Capillary: 176 mg/dL — ABNORMAL HIGH (ref 70–99)
Glucose-Capillary: 229 mg/dL — ABNORMAL HIGH (ref 70–99)

## 2022-12-28 LAB — MAGNESIUM: Magnesium: 1.6 mg/dL — ABNORMAL LOW (ref 1.7–2.4)

## 2022-12-28 LAB — PHOSPHORUS: Phosphorus: 1.5 mg/dL — ABNORMAL LOW (ref 2.5–4.6)

## 2022-12-28 MED ORDER — MAGNESIUM SULFATE 2 GM/50ML IV SOLN
2.0000 g | Freq: Once | INTRAVENOUS | Status: AC
  Administered 2022-12-28: 2 g via INTRAVENOUS
  Filled 2022-12-28: qty 50

## 2022-12-28 MED ORDER — K PHOS MONO-SOD PHOS DI & MONO 155-852-130 MG PO TABS
500.0000 mg | ORAL_TABLET | Freq: Three times a day (TID) | ORAL | Status: AC
  Administered 2022-12-28 – 2022-12-29 (×6): 500 mg via ORAL
  Filled 2022-12-28 (×6): qty 2

## 2022-12-28 NOTE — Progress Notes (Signed)
[facility]  PROGRESS NOTE  [name] [id number]:[id number] DOB: 03/29/1946 DOA: 12/12/2022 PCP: Redmond School, MD   Brief HPI:   77 year old male with past medical history of ESRD on hemodialysis Monday Wednesday Friday, HFrEF, diabetes mellitus type 2, hypertension who presented with nausea.  Patient has known history of cholecystitis and underwent cholecystostomy tube placement.  His tube had become dislodged and he underwent evaluation with surgery.  He was noted to have recanalization of the bile duct therefore tube was not replaced.  He came to hospital for elective cholecystectomy underwent preop evaluation with cardiology.  His workup was notable for diffuse T wave inversions and prolonged QTc.  Also was found to be significantly hypokalemic.  He was started on electrolyte replacement.  Nephrology was consulted for chronic hemodialysis during hospitalization.  Despite improvement of Aulik parameters, ECG changes persisted so he was transferred to [facility] for cardiac catheterization on 12/17/2022.    Subjective   Patient seen and examined, feels better this morning.  Started on tube feed last night.   Assessment/Plan:     Chronic cholecystitis -Presented with malnutrition, failure to thrive -General surgery was consulted, underwent cholecystectomy on 12/28 with intraoperative cholangiogram  -Per general surgery; pneumoperitoneum is likely from lap chole done last week -No further intervention recommended  Chronic HFrEF, abnormal ECG -Repeat echo during this admission showed EF of 50 to 55% and 35 to 40% in August 2023 -Continue cardiac monitoring -Left heart cath on 12/26 showed multivessel diffuse moderate CAD with no acute or obvious flow-limiting lesions(in absence of any active anginal symptoms, would not PCI in preop setting)  -Medical management recommended, okayed to proceed with surgery as per cardiology -Started on high-dose statin  ESRD -Nephrology consulted for  hemodialysis  Anemia of ESRD -ESA per nephrology -S/p 1 unit PRBC during this hospitalization -Hemoglobin stable at 8.8 this morning  Unstageable sacral ulcer -Foul-smelling decubitus wound noted which progressed as compared to previous imaging from 12/21 -Wound care consulted -Today after hydrotherapy patient developed fever with temperature 101 F -Blood cultures x 2 obtained, wound culture obtained; wound culture growing Enterococcus faecalis, Proteus mirabilis and gram-positive cocci -Started on empiric vancomycin and Zosyn -Follow culture results -sacral MRI ordered, 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 -Continue hydrotherapy and local wound care, no intervention recommended per general surgery   Diabetes mellitus type 2 A1c 4.4 (likely reflecting his recent failure to thrive, poor PO intake) D/c SSI   Functional Quadriplegia  Essentially bedbound for the past year (needs help toileting, turning in bed, dressing, etc) -Unclear cause Follow outpatient  Hypophosphatemia -Replace phosphorus-check phosphorus level in a.m.  Hypomagnesemia -Replace magnesium and follow magnesium level in a.m.  Protein calorie malnutrition -Patient has poor p.o. intake -BMI 22.98 kg/m -Started on core track nighttime feeding -Continue p.o. diet during daytime -core track feeding tube placed for additional nutrition to think subjective help heal sacral ulcer  Medications     allopurinol  100 mg Oral Daily   aspirin EC  81 mg Oral Daily   atorvastatin  40 mg Oral Daily   Chlorhexidine Gluconate Cloth  6 each Topical Q0600   cycloSPORINE  1 drop Both Eyes BID   darbepoetin (ARANESP) injection - DIALYSIS  200 mcg Subcutaneous Q Fri-1800   feeding supplement  237 mL Oral TID WC   feeding supplement (OSMOLITE 1.5 CAL)  1,000 mL Per Tube Q24H   [START ON  12/29/2022] feeding supplement (OSMOLITE 1.5 CAL)  1,000 mL Per Tube Q24H   feeding supplement (PROSource TF20)  60 mL Per Tube BID   Gerhardt's butt cream   Topical BID   heparin  5,000 Units Subcutaneous Q8H   isosorbide dinitrate  5 mg Oral BID   melatonin  4.5 mg Oral QHS   multivitamin  1 tablet Oral QHS   nutrition supplement (JUVEN)  1 packet Per Tube BID BM   phosphorus  500 mg Oral TID   polyethylene glycol  17 g Oral Daily   senna-docusate  1 tablet Oral BID   sodium chloride flush  3 mL Intravenous Q12H   sodium hypochlorite   Irrigation Daily     Data Reviewed:   CBG:  Recent Labs  Lab 12/26/22 1612 12/27/22 2112 12/28/22 0013 12/28/22 0419 12/28/22 0839  GLUCAP 145* 154* 153* 229* 167*    SpO2: 99 % O2 Flow Rate (L/min): 3 L/min    Vitals:   12/27/22 2100 12/28/22 0014 12/28/22 0420 12/28/22 0745  BP: 121/78 118/62 (!) 96/57 97/62  Pulse: 83 85 87 90  Resp: '20 19 20 19  '$ Temp: 99.2 F (37.3 C) 98.4 F (36.9 C) 99 F (37.2 C) 98.2 F (36.8 C)  TempSrc: Axillary Axillary Axillary Oral  SpO2: 99% 98% 98% 99%  Weight:      Height:          Data Reviewed:  Basic Metabolic Panel: Recent Labs  Lab 12/22/22 0122 12/23/22 0140 12/24/22 0239 12/25/22 0159 12/26/22 0148 12/27/22 0336 12/28/22 0218 12/28/22 0219  NA 135   < > 137 136 134* 133* 135  --   K 3.4*   < > 3.8 3.8 4.8 4.8 3.6  --   CL 100   < > 97* 100 94* 97* 99  --   CO2 25   < > '27 27 27 26 29  '$ --   GLUCOSE 105*   < > 107* 151* 121* 119* 201*  --   BUN 16   < > 20 29* 18 33* 32*  --   CREATININE 3.30*   < > 3.04* 3.38* 2.66* 3.33* 2.59*  --   CALCIUM 8.9   < > 9.3 9.2 9.0 9.5 9.0  --   MG 1.6*  --   --   --   --   --   --  1.6*  PHOS 2.9   < > 2.8 2.9 3.1 3.7 1.5* 1.5*   < > = values in this interval not displayed.    CBC: Recent Labs  Lab 12/22/22 0122 12/23/22 0140 12/24/22 0239 12/25/22 0417 12/27/22 0336  WBC 8.4 8.3 8.9 11.6* 10.4  HGB 8.1* 8.1* 8.0* 7.7* 8.8*  HCT  26.2* 26.2* 25.7* 25.7* 28.1*  MCV 102.7* 102.3* 102.4* 104.0* 101.8*  PLT 224 236 266 314 329    LFT Recent Labs  Lab 12/22/22 0122 12/23/22 0140 12/24/22 0239 12/25/22 0159 12/26/22 0148 12/27/22 0336 12/28/22 0218  AST 21  --   --   --   --   --   --   ALT <5  --   --   --   --   --   --   ALKPHOS 47  --   --   --   --   --   --   BILITOT <0.1*  --   --   --   --   --   --  PROT 6.5  --   --   --   --   --   --   ALBUMIN 2.2*   < > 2.2* 2.2* 2.1* 2.2* 2.2*   < > = values in this interval not displayed.     Antibiotics: Anti-infectives (From admission, onward)    Start     Dose/Rate Route Frequency Ordered Stop   12/28/22 0000  vancomycin (VANCOCIN) IVPB 1000 mg/200 mL premix        1,000 mg 200 mL/hr over 60 Minutes Intravenous Every M-W-F (Hemodialysis) 12/27/22 1354     12/27/22 1445  vancomycin (VANCOCIN) IVPB 1000 mg/200 mL premix        1,000 mg 200 mL/hr over 60 Minutes Intravenous  Once 12/27/22 1354 12/27/22 1738   12/27/22 1200  vancomycin (VANCOCIN) IVPB 1000 mg/200 mL premix  Status:  Discontinued        1,000 mg 200 mL/hr over 60 Minutes Intravenous Every M-W-F (Hemodialysis) 12/25/22 1831 12/27/22 1354   12/26/22 2100  ceFEPIme (MAXIPIME) 1 g in sodium chloride 0.9 % 100 mL IVPB        1 g 200 mL/hr over 30 Minutes Intravenous Every 24 hours 12/26/22 1052     12/25/22 1900  ceFEPIme (MAXIPIME) 1 g in sodium chloride 0.9 % 100 mL IVPB  Status:  Discontinued        1 g 200 mL/hr over 30 Minutes Intravenous Every 24 hours 12/25/22 1812 12/26/22 1052   12/25/22 1900  vancomycin (VANCOREADY) IVPB 1750 mg/350 mL        1,750 mg 175 mL/hr over 120 Minutes Intravenous  Once 12/25/22 1812 12/26/22 0146   12/25/22 1900  metroNIDAZOLE (FLAGYL) IVPB 500 mg        500 mg 100 mL/hr over 60 Minutes Intravenous Every 12 hours 12/25/22 1812     12/13/22 0600  cefoTEtan (CEFOTAN) 2 g in sodium chloride 0.9 % 100 mL IVPB  Status:  Discontinued        2 g 200 mL/hr  over 30 Minutes Intravenous On call to O.R. 12/12/22 1631 12/14/22 0559   12/12/22 0615  cefoTEtan (CEFOTAN) 2 g in sodium chloride 0.9 % 100 mL IVPB  Status:  Discontinued        2 g 200 mL/hr over 30 Minutes Intravenous On call to O.R. 12/12/22 4854 12/12/22 0913        DVT prophylaxis: Heparin  Code Status: Full code  Family Communication: No family at bedside   CONSULTS General surgery   Objective    Physical Examination:  Appears in no acute distress S1-S2, regular Clear to auscultation bilaterally Abdomen is soft, nontender, no organomegaly   Status is: Inpatient:      Pressure Injury 11/06/22 Sacrum Mid Unstageable - Full thickness tissue loss in which the base of the injury is covered by slough (yellow, tan, gray, green or brown) and/or eschar (tan, brown or black) in the wound bed. reddended area to the [place] with (Active)  11/06/22 1315  Location: Sacrum  Location Orientation: Mid  Staging: Unstageable - Full thickness tissue loss in which the base of the injury is covered by slough (yellow, tan, gray, green or brown) and/or eschar (tan, brown or black) in the wound bed.  Wound Description (Comments): reddended area to the sacruum with yellow eschar and foul smell  Present on Admission: Yes        Atkins   [facility] If 7PM-7AM, please contact night-coverage at [email or web address], Office  [phone]   12/28/2022, 9:19 AM  LOS: 15 days

## 2022-12-28 NOTE — Progress Notes (Addendum)
[facility] Progress Note   Subjective:  Seen in room. Completed dialysis yesterday. Net UF 1.5L. Tolerated treatment ok. Feeding tube placed for supplemental nutrition. He's asking for food this am.     Objective Vitals:   12/27/22 2100 12/28/22 0014 12/28/22 0420 12/28/22 0745  BP: 121/78 118/62 (!) 96/57 97/62  Pulse: 83 85 87 90  Resp: '20 19 20 19  '$ Temp: 99.2 F (37.3 C) 98.4 F (36.9 C) 99 F (37.2 C) 98.2 F (36.8 C)  TempSrc: Axillary Axillary Axillary Oral  SpO2: 99% 98% 98% 99%  Weight:      Height:          Additional Objective Labs: Basic Metabolic Panel: Recent Labs  Lab 12/26/22 0148 12/27/22 0336 12/28/22 0218 12/28/22 0219  NA 134* 133* 135  --   K 4.8 4.8 3.6  --   CL 94* 97* 99  --   CO2 '27 26 29  '$ --   GLUCOSE 121* 119* 201*  --   BUN 18 33* 32*  --   CREATININE 2.66* 3.33* 2.59*  --   CALCIUM 9.0 9.5 9.0  --   PHOS 3.1 3.7 1.5* 1.5*    CBC: Recent Labs  Lab 12/22/22 0122 12/23/22 0140 12/24/22 0239 12/25/22 0417 12/27/22 0336  WBC 8.4 8.3 8.9 11.6* 10.4  HGB 8.1* 8.1* 8.0* 7.7* 8.8*  HCT 26.2* 26.2* 25.7* 25.7* 28.1*  MCV 102.7* 102.3* 102.4* 104.0* 101.8*  PLT 224 236 266 314 329    Blood Culture    Component Value Date/Time   SDES WOUND 12/25/2022 1715   SPECREQUEST NONE 12/25/2022 1715   CULT  12/25/2022 1715    FEW GRAM NEGATIVE RODS ABUNDANT PROTEUS MIRABILIS ABUNDANT ENTEROCOCCUS FAECALIS SUSCEPTIBILITIES TO FOLLOW Performed at [facility], [place] [address] [place], [place] [address]    REPTSTATUS PENDING 12/25/2022 1715     Physical Exam General: Elderly man, frail, nad  Heart: RRR Lungs: Clear bilaterally Abdomen: soft non-tender Extremities: No edema Dialysis Access: R TDC   Medications:  ceFEPime (MAXIPIME) IV 1 g (12/27/22 2207)   metronidazole 500 mg (12/28/22 0030)   vancomycin      allopurinol  100 mg Oral Daily   aspirin EC  81 mg Oral Daily   atorvastatin  40 mg Oral  Daily   Chlorhexidine Gluconate Cloth  6 each Topical Q0600   cycloSPORINE  1 drop Both Eyes BID   darbepoetin (ARANESP) injection - DIALYSIS  200 mcg Subcutaneous Q Fri-1800   feeding supplement  237 mL Oral TID WC   feeding supplement (OSMOLITE 1.5 CAL)  1,000 mL Per Tube Q24H   [START ON 12/29/2022] feeding supplement (OSMOLITE 1.5 CAL)  1,000 mL Per Tube Q24H   feeding supplement (PROSource TF20)  60 mL Per Tube BID   Gerhardt's butt cream   Topical BID   heparin  5,000 Units Subcutaneous Q8H   isosorbide dinitrate  5 mg Oral BID   melatonin  4.5 mg Oral QHS   multivitamin  1 tablet Oral QHS   nutrition supplement (JUVEN)  1 packet Per Tube BID BM   polyethylene glycol  17 g Oral Daily   senna-docusate  1 tablet Oral BID   sodium chloride flush  3 mL Intravenous Q12H   sodium hypochlorite   Irrigation Daily    Dialysis Orders: [facility] MWF 4h  400/800  84.5kg  2/2.5 bath   Heparin 4000 units IV  RIJ TDC - last OP HD 12/20, post  wt 85.9kg - last IP hD 12/24, post wt 85kg - mircera 225 mcg IV q2, last 12/18, due 1/02 - no vdra   Assessment/Plan: Chronic cholecystitis - w/ nausea, malnutrition, FTT.  S/p laparoscopic cholecystectomy  Sacral decubitus ulcer - wound culture following; On IV antibiotics per primary  Chronic HFrEF - LVEF up to 50-55% here, diffuse CAD.  ESRD - on HD MWF. Continue on schedule. Next HD 1/8 HTN/ volume -BP stable. No volume exces UF as tolerated. 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 Aranesp 200 q Friday. Tsat low but Ferritin high. MBD ckd - CCa and phos controlled, not on a binder and not on VDRA as OP DM2 - per pmd   Nutrition - PCM, FTT. On regular diet + protein supp. Coretrak placed -per RD  Medication - Gabapentin dose inappropriate for ESRD pt. -Unclear indication -will d/c for now. Can resume at 300 mg q day if needed.   Lynnda Child PA-C [facility] 12/28/2022,8:46 AM  I have seen and examined this patient and agree with the plan of  care. Tolerated HD Fri and next on Mon. No absolute indication for RRT and the patient appears to be  comfortable.   Dwana Melena, MD 12/28/2022, 9:11 AM

## 2022-12-28 NOTE — Progress Notes (Signed)
Administered Fentanyl 25 mcg to pt at 0548, wasted 37.5 mcg in Pyxis. No discrepancy noted in the Pyxis.

## 2022-12-29 DIAGNOSIS — N186 End stage renal disease: Secondary | ICD-10-CM | POA: Diagnosis not present

## 2022-12-29 DIAGNOSIS — K811 Chronic cholecystitis: Secondary | ICD-10-CM | POA: Diagnosis not present

## 2022-12-29 DIAGNOSIS — D649 Anemia, unspecified: Secondary | ICD-10-CM | POA: Diagnosis not present

## 2022-12-29 DIAGNOSIS — E1169 Type 2 diabetes mellitus with other specified complication: Secondary | ICD-10-CM | POA: Diagnosis not present

## 2022-12-29 LAB — AEROBIC CULTURE W GRAM STAIN (SUPERFICIAL SPECIMEN): Gram Stain: NONE SEEN

## 2022-12-29 LAB — MAGNESIUM: Magnesium: 2.1 mg/dL (ref 1.7–2.4)

## 2022-12-29 LAB — GLUCOSE, CAPILLARY
Glucose-Capillary: 142 mg/dL — ABNORMAL HIGH (ref 70–99)
Glucose-Capillary: 155 mg/dL — ABNORMAL HIGH (ref 70–99)
Glucose-Capillary: 165 mg/dL — ABNORMAL HIGH (ref 70–99)
Glucose-Capillary: 171 mg/dL — ABNORMAL HIGH (ref 70–99)
Glucose-Capillary: 194 mg/dL — ABNORMAL HIGH (ref 70–99)

## 2022-12-29 LAB — RENAL FUNCTION PANEL
Albumin: 2.1 g/dL — ABNORMAL LOW (ref 3.5–5.0)
Anion gap: 13 (ref 5–15)
BUN: 67 mg/dL — ABNORMAL HIGH (ref 8–23)
CO2: 28 mmol/L (ref 22–32)
Calcium: 9.4 mg/dL (ref 8.9–10.3)
Chloride: 96 mmol/L — ABNORMAL LOW (ref 98–111)
Creatinine, Ser: 3.89 mg/dL — ABNORMAL HIGH (ref 0.61–1.24)
GFR, Estimated: 15 mL/min — ABNORMAL LOW (ref >60)
Glucose, Bld: 200 mg/dL — ABNORMAL HIGH (ref 70–99)
Phosphorus: 1.6 mg/dL — ABNORMAL LOW (ref 2.5–4.6)
Potassium: 4 mmol/L (ref 3.5–5.1)
Sodium: 137 mmol/L (ref 135–145)

## 2022-12-29 NOTE — Progress Notes (Signed)
[facility]  PROGRESS NOTE  [name] JOA:[id number] DOB: 03-24-1946 DOA: 12/12/2022 PCP: Redmond School, MD   Brief HPI:   77 year old male with past medical history of ESRD on hemodialysis Monday Wednesday Friday, HFrEF, diabetes mellitus type 2, hypertension who presented with nausea.  Patient has known history of cholecystitis and underwent cholecystostomy tube placement.  His tube had become dislodged and he underwent evaluation with surgery.  He was noted to have recanalization of the bile duct therefore tube was not replaced.  He came to hospital for elective cholecystectomy underwent preop evaluation with cardiology.  His workup was notable for diffuse T wave inversions and prolonged QTc.  Also was found to be significantly hypokalemic.  He was started on electrolyte replacement.  Nephrology was consulted for chronic hemodialysis during hospitalization.  Despite improvement of Aulik parameters, ECG changes persisted so he was transferred to [facility] for cardiac catheterization on 12/17/2022.    Subjective   Patient seen and examined.  Denies pain.  Started on tube feeding at nighttime.   Assessment/Plan:     Chronic cholecystitis -Presented with malnutrition, failure to thrive -General surgery was consulted, underwent cholecystectomy on 12/28 with intraoperative cholangiogram  -Per general surgery; pneumoperitoneum is likely from lap chole done last week -No further intervention recommended  Chronic HFrEF, abnormal ECG -Repeat echo during this admission showed EF of 50 to 55% and 35 to 40% in August 2023 -Continue cardiac monitoring -Left heart cath on 12/26 showed multivessel diffuse moderate CAD with no acute or obvious flow-limiting lesions(in absence of any active anginal symptoms, would not PCI in preop setting)  -Medical management recommended, okayed to proceed with surgery as per cardiology -Started on high-dose statin  ESRD -Nephrology consulted for  hemodialysis  Anemia of ESRD -ESA per nephrology -S/p 1 unit PRBC during this hospitalization -Hemoglobin stable at 8.8 this morning  Unstageable sacral ulcer -Foul-smelling decubitus wound noted which progressed as compared to previous imaging from 12/21 -Wound care consulted -Today after hydrotherapy patient developed fever with temperature 101 F -Blood cultures x 2 obtained, wound culture obtained; wound culture growing Enterococcus faecalis, Proteus mirabilis and gram-positive cocci -Started on empiric vancomycin and Zosyn -Follow culture results -sacral MRI ordered, 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 -Continue hydrotherapy and local wound care, no intervention recommended per general surgery   Diabetes mellitus type 2 A1c 4.4 (likely reflecting his recent failure to thrive, poor PO intake) D/c SSI   Functional Quadriplegia  Essentially bedbound for the past year (needs help toileting, turning in bed, dressing, etc) -Unclear cause Follow outpatient  Hypophosphatemia -Replace phosphorus-check phosphorus level in a.m.  Hypomagnesemia -Replace magnesium and follow magnesium level in a.m.  Protein calorie malnutrition -Patient has poor p.o. intake -BMI 22.98 kg/m -Started on core track nighttime feeding -Continue p.o. diet during daytime -core track feeding tube placed for additional nutrition to think subjective help heal sacral ulcer  Medications     allopurinol  100 mg Oral Daily   aspirin EC  81 mg Oral Daily   atorvastatin  40 mg Oral Daily   Chlorhexidine Gluconate Cloth  6 each Topical Q0600   cycloSPORINE  1 drop Both Eyes BID   darbepoetin (ARANESP) injection - DIALYSIS  200 mcg Subcutaneous Q Fri-1800   feeding supplement  237 mL Oral TID WC   feeding supplement (OSMOLITE 1.5 CAL)  1,000  mL Per Tube Q24H   feeding supplement  (PROSource TF20)  60 mL Per Tube BID   Gerhardt's butt cream   Topical BID   heparin  5,000 Units Subcutaneous Q8H   isosorbide dinitrate  5 mg Oral BID   melatonin  4.5 mg Oral QHS   multivitamin  1 tablet Oral QHS   nutrition supplement (JUVEN)  1 packet Per Tube BID BM   phosphorus  500 mg Oral TID   polyethylene glycol  17 g Oral Daily   senna-docusate  1 tablet Oral BID   sodium chloride flush  3 mL Intravenous Q12H     Data Reviewed:   CBG:  Recent Labs  Lab 12/28/22 1617 12/28/22 2006 12/29/22 0001 12/29/22 0748 12/29/22 1150  GLUCAP 159* 176* 165* 194* 155*    SpO2: 98 % O2 Flow Rate (L/min): 3 L/min    Vitals:   12/28/22 2002 12/28/22 2354 12/29/22 0429 12/29/22 1400  BP: 1'15/64 97/73 97/62 '$ (!) 105/56  Pulse: 98 (!) 52 88 88  Resp: '19 20 18 '$ (!) 23  Temp: 98.5 F (36.9 C) 98.3 F (36.8 C) 98.3 F (36.8 C) 98.6 F (37 C)  TempSrc: Axillary Axillary Oral Oral  SpO2: 98% 97% 99% 98%  Weight:      Height:          Data Reviewed:  Basic Metabolic Panel: Recent Labs  Lab 12/25/22 0159 12/26/22 0148 12/27/22 0336 12/28/22 0218 12/28/22 0219 12/29/22 0132  NA 136 134* 133* 135  --  137  K 3.8 4.8 4.8 3.6  --  4.0  CL 100 94* 97* 99  --  96*  CO2 '27 27 26 29  '$ --  28  GLUCOSE 151* 121* 119* 201*  --  200*  BUN 29* 18 33* 32*  --  67*  CREATININE 3.38* 2.66* 3.33* 2.59*  --  3.89*  CALCIUM 9.2 9.0 9.5 9.0  --  9.4  MG  --   --   --   --  1.6* 2.1  PHOS 2.9 3.1 3.7 1.5* 1.5* 1.6*    CBC: Recent Labs  Lab 12/23/22 0140 12/24/22 0239 12/25/22 0417 12/27/22 0336  WBC 8.3 8.9 11.6* 10.4  HGB 8.1* 8.0* 7.7* 8.8*  HCT 26.2* 25.7* 25.7* 28.1*  MCV 102.3* 102.4* 104.0* 101.8*  PLT 236 266 314 329    LFT Recent Labs  Lab 12/25/22 0159 12/26/22 0148 12/27/22 0336 12/28/22 0218 12/29/22 0132  ALBUMIN 2.2* 2.1* 2.2* 2.2* 2.1*     Antibiotics: Anti-infectives (From admission, onward)    Start     Dose/Rate Route Frequency Ordered Stop    12/28/22 0000  vancomycin (VANCOCIN) IVPB 1000 mg/200 mL premix        1,000 mg 200 mL/hr over 60 Minutes Intravenous Every M-W-F (Hemodialysis) 12/27/22 1354     12/27/22 1445  vancomycin (VANCOCIN) IVPB 1000 mg/200 mL premix        1,000 mg 200 mL/hr over 60 Minutes Intravenous  Once 12/27/22 1354 12/27/22 1738   12/27/22 1200  vancomycin (VANCOCIN) IVPB 1000 mg/200 mL premix  Status:  Discontinued        1,000 mg 200 mL/hr over 60 Minutes Intravenous Every M-W-F (Hemodialysis) 12/25/22 1831 12/27/22 1354   12/26/22 2100  ceFEPIme (MAXIPIME) 1 g in sodium chloride 0.9 % 100 mL IVPB        1 g 200 mL/hr over 30 Minutes Intravenous Every 24 hours 12/26/22 1052     12/25/22 1900  ceFEPIme (MAXIPIME) 1 g in sodium chloride 0.9 % 100 mL IVPB  Status:  Discontinued        1 g 200 mL/hr over 30 Minutes Intravenous Every 24 hours 12/25/22 1812 12/26/22 1052   12/25/22 1900  vancomycin (VANCOREADY) IVPB 1750 mg/350 mL        1,750 mg 175 mL/hr over 120 Minutes Intravenous  Once 12/25/22 1812 12/26/22 0146   12/25/22 1900  metroNIDAZOLE (FLAGYL) IVPB 500 mg        500 mg 100 mL/hr over 60 Minutes Intravenous Every 12 hours 12/25/22 1812     12/13/22 0600  cefoTEtan (CEFOTAN) 2 g in sodium chloride 0.9 % 100 mL IVPB  Status:  Discontinued        2 g 200 mL/hr over 30 Minutes Intravenous On call to O.R. 12/12/22 1631 12/14/22 0559   12/12/22 0615  cefoTEtan (CEFOTAN) 2 g in sodium chloride 0.9 % 100 mL IVPB  Status:  Discontinued        2 g 200 mL/hr over 30 Minutes Intravenous On call to O.R. 12/12/22 8756 12/12/22 0913        DVT prophylaxis: Heparin  Code Status: Full code  Family Communication: No family at bedside   CONSULTS General surgery   Objective    Physical Examination:  Appears in no acute distress Heart S1-S2, regular Clear to auscultation bilaterally    Status is: Inpatient:      Pressure Injury 11/06/22 Sacrum Mid Unstageable - Full thickness tissue  loss in which the base of the injury is covered by slough (yellow, tan, gray, green or brown) and/or eschar (tan, brown or black) in the wound bed. reddended area to the [place] with (Active)  11/06/22 1315  Location: Sacrum  Location Orientation: Mid  Staging: Unstageable - Full thickness tissue loss in which the base of the injury is covered by slough (yellow, tan, gray, green or brown) and/or eschar (tan, brown or black) in the wound bed.  Wound Description (Comments): reddended area to the sacruum with yellow eschar and foul smell  Present on Admission: Yes        Amado   [facility] If 7PM-7AM, please contact night-coverage at [email or web address], Office  [phone]   12/29/2022, 4:04 PM  LOS: 16 days

## 2022-12-29 NOTE — Progress Notes (Signed)
[facility] Progress Note   Subjective:  Seen in room. Had breakfast tray but doesn't feel like eating much. Now getting TF at night. No new concerns.    Objective Vitals:   12/28/22 1426 12/28/22 2002 12/28/22 2354 12/29/22 0429  BP: (!) 118/44 1'15/64 97/73 97/62 '$  Pulse: 99 98 (!) 52 88  Resp: (!) '23 19 20 18  '$ Temp: 98.8 F (37.1 C) 98.5 F (36.9 C) 98.3 F (36.8 C) 98.3 F (36.8 C)  TempSrc: Oral Axillary Axillary Oral  SpO2: 97% 98% 97% 99%  Weight:      Height:          Additional Objective Labs: Basic Metabolic Panel: Recent Labs  Lab 12/27/22 0336 12/28/22 0218 12/28/22 0219 12/29/22 0132  NA 133* 135  --  137  K 4.8 3.6  --  4.0  CL 97* 99  --  96*  CO2 26 29  --  28  GLUCOSE 119* 201*  --  200*  BUN 33* 32*  --  67*  CREATININE 3.33* 2.59*  --  3.89*  CALCIUM 9.5 9.0  --  9.4  PHOS 3.7 1.5* 1.5* 1.6*    CBC: Recent Labs  Lab 12/23/22 0140 12/24/22 0239 12/25/22 0417 12/27/22 0336  WBC 8.3 8.9 11.6* 10.4  HGB 8.1* 8.0* 7.7* 8.8*  HCT 26.2* 25.7* 25.7* 28.1*  MCV 102.3* 102.4* 104.0* 101.8*  PLT 236 266 314 329    Blood Culture    Component Value Date/Time   SDES WOUND 12/25/2022 1715   SPECREQUEST NONE 12/25/2022 1715   CULT  12/25/2022 1715    FEW ESCHERICHIA COLI ABUNDANT PROTEUS MIRABILIS ABUNDANT ENTEROCOCCUS FAECALIS SUSCEPTIBILITIES TO FOLLOW Performed at [facility], [place] [address] [place], [place] [address]    REPTSTATUS PENDING 12/25/2022 1715     Physical Exam General: Elderly man, frail, nad  Heart: RRR Lungs: Clear bilaterally Abdomen: soft non-tender Extremities: No edema Dialysis Access: R TDC   Medications:  ceFEPime (MAXIPIME) IV Stopped (12/29/22 1601)   metronidazole Stopped (12/28/22 2348)   vancomycin      allopurinol  100 mg Oral Daily   aspirin EC  81 mg Oral Daily   atorvastatin  40 mg Oral Daily   Chlorhexidine Gluconate Cloth  6 each Topical Q0600   cycloSPORINE  1 drop  Both Eyes BID   darbepoetin (ARANESP) injection - DIALYSIS  200 mcg Subcutaneous Q Fri-1800   feeding supplement  237 mL Oral TID WC   feeding supplement (OSMOLITE 1.5 CAL)  1,000 mL Per Tube Q24H   feeding supplement (PROSource TF20)  60 mL Per Tube BID   Gerhardt's butt cream   Topical BID   heparin  5,000 Units Subcutaneous Q8H   isosorbide dinitrate  5 mg Oral BID   melatonin  4.5 mg Oral QHS   multivitamin  1 tablet Oral QHS   nutrition supplement (JUVEN)  1 packet Per Tube BID BM   phosphorus  500 mg Oral TID   polyethylene glycol  17 g Oral Daily   senna-docusate  1 tablet Oral BID   sodium chloride flush  3 mL Intravenous Q12H    Dialysis Orders: [facility] MWF 4h  400/800  84.5kg  2/2.5 bath   Heparin 4000 units IV  RIJ TDC - last OP HD 12/20, post wt 85.9kg - last IP hD 12/24, post wt 85kg - mircera 225 mcg IV q2, last 12/18, due 1/02 - no vdra   Assessment/Plan: Chronic cholecystitis -  w/ nausea, malnutrition, FTT.  S/p laparoscopic cholecystectomy  Sacral decubitus ulcer - wound culture following; On IV antibiotics per primary  Chronic HFrEF - LVEF up to 50-55% here, diffuse CAD.  ESRD - on HD MWF. Continue on schedule. Next HD 1/8 HTN/ volume -BP stable. No volume exces UF as tolerated. 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 Aranesp 200 q Friday. Tsat low but Ferritin high. MBD ckd - CCa and phos controlled, not on a binder and not on VDRA as OP DM2 - per pmd   Nutrition - PCM, FTT. On regular diet + protein supp. Coretrak placed -per RD. Encouraged PO intake  Medication - Gabapentin dose inappropriate for ESRD pt. -Unclear indication -will d/c for now. Can resume at 300 mg q day if needed.   Lynnda Child PA-C [facility] 12/29/2022,8:20 AM

## 2022-12-30 ENCOUNTER — Ambulatory Visit: Payer: Medicare Other | Admitting: Cardiology

## 2022-12-30 DIAGNOSIS — N186 End stage renal disease: Secondary | ICD-10-CM | POA: Diagnosis not present

## 2022-12-30 DIAGNOSIS — E1169 Type 2 diabetes mellitus with other specified complication: Secondary | ICD-10-CM | POA: Diagnosis not present

## 2022-12-30 DIAGNOSIS — D649 Anemia, unspecified: Secondary | ICD-10-CM | POA: Diagnosis not present

## 2022-12-30 DIAGNOSIS — K811 Chronic cholecystitis: Secondary | ICD-10-CM | POA: Diagnosis not present

## 2022-12-30 LAB — PHOSPHORUS: Phosphorus: 3.1 mg/dL (ref 2.5–4.6)

## 2022-12-30 LAB — RENAL FUNCTION PANEL
Albumin: 2.1 g/dL — ABNORMAL LOW (ref 3.5–5.0)
Anion gap: 13 (ref 5–15)
BUN: 88 mg/dL — ABNORMAL HIGH (ref 8–23)
CO2: 27 mmol/L (ref 22–32)
Calcium: 9.5 mg/dL (ref 8.9–10.3)
Chloride: 100 mmol/L (ref 98–111)
Creatinine, Ser: 4.98 mg/dL — ABNORMAL HIGH (ref 0.61–1.24)
GFR, Estimated: 11 mL/min — ABNORMAL LOW (ref >60)
Glucose, Bld: 171 mg/dL — ABNORMAL HIGH (ref 70–99)
Phosphorus: 3.1 mg/dL (ref 2.5–4.6)
Potassium: 4.4 mmol/L (ref 3.5–5.1)
Sodium: 140 mmol/L (ref 135–145)

## 2022-12-30 LAB — PREPARE RBC (CROSSMATCH)

## 2022-12-30 LAB — CULTURE, BLOOD (ROUTINE X 2)
Culture: NO GROWTH
Culture: NO GROWTH
Special Requests: ADEQUATE
Special Requests: ADEQUATE

## 2022-12-30 LAB — CBC
HCT: 20.4 % — ABNORMAL LOW (ref 39.0–52.0)
Hemoglobin: 6.3 g/dL — CL (ref 13.0–17.0)
MCH: 31.5 pg (ref 26.0–34.0)
MCHC: 30.9 g/dL (ref 30.0–36.0)
MCV: 102 fL — ABNORMAL HIGH (ref 80.0–100.0)
Platelets: 371 10*3/uL (ref 150–400)
RBC: 2 MIL/uL — ABNORMAL LOW (ref 4.22–5.81)
RDW: 17.8 % — ABNORMAL HIGH (ref 11.5–15.5)
WBC: 12.6 10*3/uL — ABNORMAL HIGH (ref 4.0–10.5)
nRBC: 0 % (ref 0.0–0.2)

## 2022-12-30 LAB — GLUCOSE, CAPILLARY
Glucose-Capillary: 161 mg/dL — ABNORMAL HIGH (ref 70–99)
Glucose-Capillary: 176 mg/dL — ABNORMAL HIGH (ref 70–99)
Glucose-Capillary: 177 mg/dL — ABNORMAL HIGH (ref 70–99)
Glucose-Capillary: 188 mg/dL — ABNORMAL HIGH (ref 70–99)
Glucose-Capillary: 206 mg/dL — ABNORMAL HIGH (ref 70–99)

## 2022-12-30 LAB — MAGNESIUM: Magnesium: 2 mg/dL (ref 1.7–2.4)

## 2022-12-30 MED ORDER — LINEZOLID 600 MG/300ML IV SOLN
600.0000 mg | Freq: Two times a day (BID) | INTRAVENOUS | Status: DC
  Administered 2022-12-30: 600 mg via INTRAVENOUS
  Filled 2022-12-30 (×2): qty 300

## 2022-12-30 MED ORDER — ANTICOAGULANT SODIUM CITRATE 4% (200MG/5ML) IV SOLN: 5.0000 mL | Status: DC | PRN

## 2022-12-30 MED ORDER — ALTEPLASE 2 MG IJ SOLR: 2.0000 mg | Freq: Once | INTRAMUSCULAR | Status: DC | PRN

## 2022-12-30 MED ORDER — HEPARIN SODIUM (PORCINE) 1000 UNIT/ML IJ SOLN
INTRAMUSCULAR | Status: AC
  Administered 2022-12-30: 3800 [IU]
  Filled 2022-12-30: qty 5

## 2022-12-30 MED ORDER — HEPARIN SODIUM (PORCINE) 1000 UNIT/ML IJ SOLN
4000.0000 [IU] | Freq: Once | INTRAMUSCULAR | Status: AC
  Administered 2023-01-01: 4000 [IU] via INTRAVENOUS

## 2022-12-30 MED ORDER — SODIUM CHLORIDE 0.9 % IV SOLN
2.0000 g | Freq: Two times a day (BID) | INTRAVENOUS | Status: AC
  Administered 2022-12-30 – 2023-01-06 (×13): 2 g via INTRAVENOUS
  Filled 2022-12-30 (×13): qty 2000

## 2022-12-30 MED ORDER — SODIUM CHLORIDE 0.9% IV SOLUTION: Freq: Once | INTRAVENOUS | Status: DC

## 2022-12-30 MED ORDER — HEPARIN SODIUM (PORCINE) 1000 UNIT/ML DIALYSIS
4000.0000 [IU] | INTRAMUSCULAR | Status: DC | PRN
  Administered 2022-12-30: 4000 [IU] via INTRAVENOUS_CENTRAL

## 2022-12-30 NOTE — Progress Notes (Addendum)
[facility]  PROGRESS NOTE  [name] MEQ:[id number] DOB: November 04, 1946 DOA: 12/12/2022 PCP: Redmond School, MD   Brief HPI:   77 year old male with past medical history of ESRD on hemodialysis Monday Wednesday Friday, HFrEF, diabetes mellitus type 2, hypertension who presented with nausea.  Patient has known history of cholecystitis and underwent cholecystostomy tube placement.  His tube had become dislodged and he underwent evaluation with surgery.  He was noted to have recanalization of the bile duct therefore tube was not replaced.  He came to hospital for elective cholecystectomy underwent preop evaluation with cardiology.  His workup was notable for diffuse T wave inversions and prolonged QTc.  Also was found to be significantly hypokalemic.  He was started on electrolyte replacement.  Nephrology was consulted for chronic hemodialysis during hospitalization.  Despite improvement of Aulik parameters, ECG changes persisted so he was transferred to [facility] for cardiac catheterization on 12/17/2022.    Subjective   Patient seen and examined, complains of pain in the back at the site of decubitus ulcer.   Assessment/Plan:     Chronic cholecystitis -Presented with malnutrition, failure to thrive -General surgery was consulted, underwent cholecystectomy on 12/28 with intraoperative cholangiogram  -Per general surgery; pneumoperitoneum is likely from lap chole done last week -No further intervention recommended  Chronic HFrEF, abnormal ECG -Repeat echo during this admission showed EF of 50 to 55% and 35 to 40% in August 2023 -Continue cardiac monitoring -Left heart cath on 12/26 showed multivessel diffuse moderate CAD with no acute or obvious flow-limiting lesions(in absence of any active anginal symptoms, would not PCI in preop setting)  -Medical management recommended, okayed to proceed with surgery as per cardiology -Started on high-dose statin  ESRD -Nephrology consulted for  hemodialysis  Anemia of ESRD -ESA per nephrology -S/p 1 unit PRBC during this hospitalization -Hemoglobin stable at 8.8 this morning  Unstageable sacral ulcer -Foul-smelling decubitus wound noted which progressed as compared to previous imaging from 12/21 -Wound care consulted -Today after hydrotherapy patient developed fever with temperature 101 F -Blood cultures x 2 obtained, wound culture obtained;  -wound culture growing Enterococcus faecalis, Proteus mirabilis and gram-positive cocci -Started on empiric vancomycin and Zosyn -Will change antibiotics to ampicillin, Flagyl -sacral MRI ordered, 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 -Continue hydrotherapy and local wound care, no intervention recommended per general surgery -Continue oxycodone 5 mg every 4 hours as needed   Diabetes mellitus type 2 A1c 4.4 (likely reflecting his recent failure to thrive, poor PO intake) D/c SSI   Functional Quadriplegia  Essentially bedbound for the past year (needs help toileting, turning in bed, dressing, etc) -Unclear cause Follow outpatient  Hypophosphatemia -Replete  Hypomagnesemia -Replete  Protein calorie malnutrition -Patient has poor p.o. intake -BMI 22.98 kg/m -Started on core track nighttime feeding -Continue p.o. diet during daytime -core track feeding tube placed for additional nutrition to think subjective help heal sacral ulcer  Medications     allopurinol  100 mg Oral Daily   aspirin EC  81 mg Oral Daily   atorvastatin  40 mg Oral Daily   Chlorhexidine Gluconate Cloth  6 each Topical Q0600   cycloSPORINE  1 drop Both Eyes BID   darbepoetin (ARANESP) injection - DIALYSIS  200 mcg Subcutaneous Q Fri-1800   feeding supplement  237 mL Oral TID WC   feeding supplement (OSMOLITE 1.5  CAL)  1,000 mL Per Tube Q24H   feeding supplement (PROSource  TF20)  60 mL Per Tube BID   Gerhardt's butt cream   Topical BID   heparin  5,000 Units Subcutaneous Q8H   isosorbide dinitrate  5 mg Oral BID   melatonin  4.5 mg Oral QHS   multivitamin  1 tablet Oral QHS   nutrition supplement (JUVEN)  1 packet Per Tube BID BM   polyethylene glycol  17 g Oral Daily   senna-docusate  1 tablet Oral BID   sodium chloride flush  3 mL Intravenous Q12H     Data Reviewed:   CBG:  Recent Labs  Lab 12/29/22 2013 12/29/22 2100 12/30/22 0007 12/30/22 0423 12/30/22 0748  GLUCAP 142* 171* 188* 176* 177*    SpO2: 100 % O2 Flow Rate (L/min): 3 L/min    Vitals:   12/30/22 0500 12/30/22 0600 12/30/22 0632 12/30/22 0700  BP:      Pulse: 86 85 84 88  Resp: (!) 35 (!) '22 17 19  '$ Temp:      TempSrc:      SpO2: 100% 100% 98% 100%  Weight:  86 kg    Height:          Data Reviewed:  Basic Metabolic Panel: Recent Labs  Lab 12/26/22 0148 12/27/22 0336 12/28/22 0218 12/28/22 0219 12/29/22 0132 12/30/22 0156  NA 134* 133* 135  --  137 140  K 4.8 4.8 3.6  --  4.0 4.4  CL 94* 97* 99  --  96* 100  CO2 '27 26 29  '$ --  28 27  GLUCOSE 121* 119* 201*  --  200* 171*  BUN 18 33* 32*  --  67* 88*  CREATININE 2.66* 3.33* 2.59*  --  3.89* 4.98*  CALCIUM 9.0 9.5 9.0  --  9.4 9.5  MG  --   --   --  1.6* 2.1 2.0  PHOS 3.1 3.7 1.5* 1.5* 1.6* 3.1  3.1    CBC: Recent Labs  Lab 12/24/22 0239 12/25/22 0417 12/27/22 0336  WBC 8.9 11.6* 10.4  HGB 8.0* 7.7* 8.8*  HCT 25.7* 25.7* 28.1*  MCV 102.4* 104.0* 101.8*  PLT 266 314 329    LFT Recent Labs  Lab 12/26/22 0148 12/27/22 0336 12/28/22 0218 12/29/22 0132 12/30/22 0156  ALBUMIN 2.1* 2.2* 2.2* 2.1* 2.1*     Antibiotics: Anti-infectives (From admission, onward)    Start     Dose/Rate Route Frequency Ordered Stop   12/28/22 0000  vancomycin (VANCOCIN) IVPB 1000 mg/200 mL premix        1,000 mg 200 mL/hr over 60 Minutes Intravenous Every M-W-F (Hemodialysis) 12/27/22 1354     12/27/22 1445   vancomycin (VANCOCIN) IVPB 1000 mg/200 mL premix        1,000 mg 200 mL/hr over 60 Minutes Intravenous  Once 12/27/22 1354 12/27/22 1738   12/27/22 1200  vancomycin (VANCOCIN) IVPB 1000 mg/200 mL premix  Status:  Discontinued        1,000 mg 200 mL/hr over 60 Minutes Intravenous Every M-W-F (Hemodialysis) 12/25/22 1831 12/27/22 1354   12/26/22 2100  ceFEPIme (MAXIPIME) 1 g in sodium chloride 0.9 % 100 mL IVPB        1 g 200 mL/hr over 30 Minutes Intravenous Every 24 hours 12/26/22 1052     12/25/22 1900  ceFEPIme (MAXIPIME) 1 g in sodium chloride 0.9 % 100 mL IVPB  Status:  Discontinued        1  g 200 mL/hr over 30 Minutes Intravenous Every 24 hours 12/25/22 1812 12/26/22 1052   12/25/22 1900  vancomycin (VANCOREADY) IVPB 1750 mg/350 mL        1,750 mg 175 mL/hr over 120 Minutes Intravenous  Once 12/25/22 1812 12/26/22 0146   12/25/22 1900  metroNIDAZOLE (FLAGYL) IVPB 500 mg        500 mg 100 mL/hr over 60 Minutes Intravenous Every 12 hours 12/25/22 1812     12/13/22 0600  cefoTEtan (CEFOTAN) 2 g in sodium chloride 0.9 % 100 mL IVPB  Status:  Discontinued        2 g 200 mL/hr over 30 Minutes Intravenous On call to O.R. 12/12/22 1631 12/14/22 0559   12/12/22 0615  cefoTEtan (CEFOTAN) 2 g in sodium chloride 0.9 % 100 mL IVPB  Status:  Discontinued        2 g 200 mL/hr over 30 Minutes Intravenous On call to O.R. 12/12/22 5364 12/12/22 0913        DVT prophylaxis: Heparin  Code Status: Full code  Family Communication: No family at bedside   CONSULTS General surgery   Objective    Physical Examination:  General-appears in no acute distress Heart-S1-S2, regular, no murmur auscultated Lungs-clear to auscultation bilaterally, no wheezing or crackles auscultated Abdomen-soft, nontender, no organomegaly Extremities-no edema in the lower extremities    Status is: Inpatient:      Pressure Injury 11/06/22 Sacrum Mid Unstageable - Full thickness tissue loss in which the base  of the injury is covered by slough (yellow, tan, gray, green or brown) and/or eschar (tan, brown or black) in the wound bed. reddended area to the [place] with (Active)  11/06/22 1315  Location: Sacrum  Location Orientation: Mid  Staging: Unstageable - Full thickness tissue loss in which the base of the injury is covered by slough (yellow, tan, gray, green or brown) and/or eschar (tan, brown or black) in the wound bed.  Wound Description (Comments): reddended area to the sacruum with yellow eschar and foul smell  Present on Admission: Yes        [place]   [facility] If 7PM-7AM, please contact night-coverage at [email or web address], Office  [phone]   12/30/2022, 9:00 AM  LOS: 17 days

## 2022-12-30 NOTE — Progress Notes (Signed)
7014D, received patient from HD unit. Asleep.

## 2022-12-30 NOTE — Progress Notes (Signed)
Received patient in bed to unit.  Alert and oriented.  Informed consent signed and in chart.   Treatment initiated: 1617 Treatment completed: 1925  Patient tolerated well.  Transported back to the room  Alert, without acute distress.  Hand-off given to patient's nurse.   Access used: Catheter Access issues: ran reverse  Total UF removed: 1500 Medication(s) given: none Post HD VS: 98.4, 110/47(64), HR-90, RR-18, [id number] Post HD weight: 86.498kg   [facility]

## 2022-12-30 NOTE — Progress Notes (Signed)
Date and time results received: 12/30/22 1642 (use smartphrase ".now" to insert current time)  Test: Hemoglobin  Critical Value: 6.3  Name of Provider Notified: [id number]  Orders Received? Or Actions Taken?:  No orders yet. Pt is in HD.

## 2022-12-30 NOTE — Progress Notes (Deleted)
Clinical Summary [name] is a 77 y.o.male   seen today for follow up of the following medical problems.    1. CAD NSTEMI on 11/16/2012 and received a drug-eluting stent to the LAD and D1, with residual diffuse nonobstructive disease in the RCA and LCx   - bradycardia on metoprolol, now off   - admit with chest pain 01/2022 -Trops neg. EKG without specific ischemic changes - 01/2022 echo LVEF 60-65%, no WMAs - episode happened while getting groceries. Across entire top chest, "real bad and then eased off". No other associated symptoms. Lasted 2-3 minutes.   - no recent exertional symptoms - exertion limited due to back pains.    11/2022 cath: distal LAD 75%, LCX moderate diffuse disease, RCA prox 50%, mid 65%. No intervention indicated.  11/2022 echo: LVEF 50-55%   2. HTN - compliant with meds - ARB stopped by renal due to AKi, started on hydralazine.    3. Hyperlipidemia - labs followed by pcp   4. CKD - followed by Dr Theador Hawthorne     5. OSA   6. Chronic diastolic HF - defer diuretics to nephrology Past Medical History:  Diagnosis Date   Anginal pain ([place])    Anxiety    Benign prostatic hypertrophy    Nocturia   CAD (coronary artery disease)    a.  NSTEMI 10/2012 s/p DES to LAD & DES to 1st diagonal with residual diffuse nonobstructive dz in LCx/RCA; b. 04/2022 MV: EF 55%, prior inferior MI. Small apical infarct w/ mild peri-infarct ischemia.   Cardiomyopathy ([place])    a. 03/2009 Echo: EF 50-55%; b. 01/2022 Echo: EF 60-65%; c. 07/2022 Echo: EF 35-40%, glob HK, worse @ apex. In Afib during study.   Degenerative joint disease    Depression    hx of   Diabetes mellitus, type II ([place]) 12/10/2012   Erectile dysfunction    ESRD (end stage renal disease) ([place])    a. HD initiated 07/2022.   Flu 2013   hx of   GERD (gastroesophageal reflux disease)    Gout    Hemorrhoids    High cholesterol    Hypertension    Exercise induced   Myocardial infarction [facility])     Neuropathy    bilateral legs   NSVT (nonsustained ventricular tachycardia) ([facility])    Exercise induced   Obesity    PAF (paroxysmal atrial fibrillation) ([place])    a. CHA2DS2VASc = 6.  No OAC   Spinal cord stimulator    Tubular adenoma 2014   Vertigo    everyday     Allergies  Allergen Reactions   Metformin Nausea And Vomiting   Niacin    Niacin And Related Other (See Comments)    Reaction: flushing and burning side effects even with extended release     No current facility-administered medications for this visit.   No current outpatient medications on file.   Facility-Administered Medications Ordered in Other Visits  Medication Dose Route Frequency Provider Last Rate Last Admin   acetaminophen (TYLENOL) tablet 650 mg  650 mg Oral Q4H PRN Stark Klein, MD   650 mg at 12/29/22 2101   allopurinol (ZYLOPRIM) tablet 100 mg  100 mg Oral Daily Stark Klein, MD   100 mg at 12/30/22 8416   aspirin EC tablet 81 mg  81 mg Oral Daily Stark Klein, MD   81 mg at 12/30/22 6063   atorvastatin (LIPITOR) tablet 40 mg  40 mg Oral Daily Stark Klein,  MD   40 mg at 12/30/22 9678   ceFEPIme (MAXIPIME) 1 g in sodium chloride 0.9 % 100 mL IVPB  1 g Intravenous Q24H Oswald Hillock, MD   Stopped at 12/29/22 2138   Chlorhexidine Gluconate Cloth 2 % PADS 6 each  6 each Topical Q0600 Elodia Florence., MD   6 each at 12/30/22 0542   cycloSPORINE (RESTASIS) 0.05 % ophthalmic emulsion 1 drop  1 drop Both Eyes BID Stark Klein, MD   1 drop at 12/30/22 9381   Darbepoetin Alfa (ARANESP) injection 200 mcg  200 mcg Subcutaneous Q Fri-1800 Lynnda Child, PA-C   200 mcg at 12/27/22 1810   feeding supplement (ENSURE ENLIVE / ENSURE PLUS) liquid 237 mL  237 mL Oral TID WC Oswald Hillock, MD   237 mL at 12/30/22 0755   feeding supplement (OSMOLITE 1.5 CAL) liquid 1,000 mL  1,000 mL Per Tube Q24H Oswald Hillock, MD   Infusion Verify at 12/30/22 0700   feeding supplement (PROSource TF20) liquid 60 mL  60 mL  Per Tube BID Oswald Hillock, MD   60 mL at 12/30/22 0175   fentaNYL (SUBLIMAZE) injection 12.5-25 mcg  12.5-25 mcg Intravenous Q2H PRN Stark Klein, MD   25 mcg at 12/30/22 0919   Gerhardt's butt cream   Topical BID Elodia Florence., MD   Given at 12/30/22 1025   heparin injection 1,000 Units  1,000 Units Intracatheter PRN Stark Klein, MD   3,800 Units at 12/27/22 1330   heparin injection 5,000 Units  5,000 Units Subcutaneous Q8H Stark Klein, MD   5,000 Units at 12/30/22 0550   isosorbide dinitrate (ISORDIL) tablet 5 mg  5 mg Oral BID Irene Pap N, DO   5 mg at 12/29/22 2128   linezolid (ZYVOX) IVPB 600 mg  600 mg Intravenous Q12H Oswald Hillock, MD 300 mL/hr at 12/30/22 1142 600 mg at 12/30/22 1142   LORazepam (ATIVAN) injection 0.25 mg  0.25 mg Intravenous Q4H PRN Elodia Florence., MD   0.25 mg at 12/19/22 1404   melatonin tablet 4.5 mg  4.5 mg Oral QHS Stark Klein, MD   4.5 mg at 12/29/22 2124   metroNIDAZOLE (FLAGYL) IVPB 500 mg  500 mg Intravenous Q12H Oswald Hillock, MD 100 mL/hr at 12/30/22 1036 500 mg at 12/30/22 1036   multivitamin (RENA-VIT) tablet 1 tablet  1 tablet Oral QHS Oswald Hillock, MD   1 tablet at 12/29/22 2124   nitroGLYCERIN (NITROSTAT) SL tablet 0.4 mg  0.4 mg Sublingual Q5 min PRN Stark Klein, MD       nutrition supplement (JUVEN) (JUVEN) powder packet 1 packet  1 packet Per Tube BID BM Oswald Hillock, MD   1 packet at 12/30/22 8527   oxyCODONE (Oxy IR/ROXICODONE) immediate release tablet 2.5-5 mg  2.5-5 mg Oral Q4H PRN Stark Klein, MD   5 mg at 12/29/22 0428   polyethylene glycol (MIRALAX / GLYCOLAX) packet 17 g  17 g Oral Daily Stark Klein, MD   17 g at 12/30/22 7824   polyethylene glycol (MIRALAX / GLYCOLAX) packet 17 g  17 g Oral Daily PRN Irene Pap N, DO       polyvinyl alcohol (LIQUIFILM TEARS) 1.4 % ophthalmic solution 1 drop  1 drop Both Eyes Daily PRN Stark Klein, MD       prochlorperazine (COMPAZINE) injection 5 mg  5 mg Intravenous Q6H  PRN Irene Pap N, DO   5  mg at 12/24/22 2009   senna-docusate (Senokot-S) tablet 1 tablet  1 tablet Oral BID Kayleen Memos, DO   1 tablet at 12/30/22 9833   simethicone (MYLICON) chewable tablet 80 mg  80 mg Oral Q6H PRN Stark Klein, MD   80 mg at 12/27/22 1511   sodium chloride flush (NS) 0.9 % injection 3 mL  3 mL Intravenous Q12H Stark Klein, MD   3 mL at 12/30/22 0924   traMADol (ULTRAM) tablet 50 mg  50 mg Oral QID PRN Stark Klein, MD   50 mg at 12/29/22 1840     Past Surgical History:  Procedure Laterality Date   BACK SURGERY  2007   x2   BIOPSY  02/05/2021   Procedure: BIOPSY;  Surgeon: Daneil Dolin, MD;  [facility];  Service: Endoscopy;;   CARDIAC CATHETERIZATION     with stent placement   CHOLECYSTECTOMY N/A 12/19/2022   Procedure: LAPAROSCOPIC CHOLECYSTECTOMY;  Surgeon: Stark Klein, MD;  Location: [place];  Service: General;  Laterality: N/A;   COLONOSCOPY WITH ESOPHAGOGASTRODUODENOSCOPY (EGD) N/A 11/23/2013   Dr. Oneida Alar- TCS= left sided colitis and mild proctitis likely due to ischemic colitis, moderate internal hemorrhoids, small nodule in the rectum, bx=tubular adenoma, EGD=-mild non-erosive gastritis   COLONOSCOPY WITH PROPOFOL N/A 07/24/2020   hemorrhoids,  rectosigmoid lipoma, small adenoma removed.   CORONARY ANGIOPLASTY     ESOPHAGOGASTRODUODENOSCOPY (EGD) WITH PROPOFOL N/A 02/05/2021   Normal esophagus, erythematous mucosa in entire stomach, no obvious ulcer, s/p biopsy. Normal duodenum. Negative H.pylori.    HERNIA REPAIR     INSERTION OF DIALYSIS CATHETER N/A 08/06/2022   Procedure: INSERTION OF DIALYSIS CATHETER;  Surgeon: Rosetta Posner, MD;  Location: [facility];  Service: Vascular;  Laterality: N/A;   IR EXCHANGE BILIARY DRAIN  10/17/2022   IR PERC CHOLECYSTOSTOMY  08/20/2022   JOINT REPLACEMENT     LEFT HEART CATH AND CORONARY ANGIOGRAPHY N/A 12/17/2022   Procedure: LEFT HEART CATH AND CORONARY ANGIOGRAPHY;  Surgeon: Leonie Man, MD;   Location: [place] [facility];  Service: Cardiovascular;  Laterality: N/A;   LEFT HEART CATHETERIZATION WITH CORONARY ANGIOGRAM N/A 11/16/2012   Procedure: LEFT HEART CATHETERIZATION WITH CORONARY ANGIOGRAM;  Surgeon: Sherren Mocha, MD;  Location: [facility] CATH LAB;  Service: Cardiovascular;  Laterality: N/A;   PERCUTANEOUS CORONARY STENT INTERVENTION (PCI-S)  11/16/2012   Procedure: PERCUTANEOUS CORONARY STENT INTERVENTION (PCI-S);  Surgeon: Sherren Mocha, MD;  Location: [facility] CATH LAB;  Service: Cardiovascular;;   POLYPECTOMY  07/24/2020   Procedure: POLYPECTOMY;  Surgeon: Daneil Dolin, MD;  [facility];  Service: Endoscopy;;   SPINAL CORD STIMULATOR INSERTION N/A 06/10/2022   Procedure: Spinal cord stimulator placement;  Surgeon: Newman Pies, MD;  Location: [place];  Service: Neurosurgery;  Laterality: N/A;  RM 19/ to follow   THUMB AMPUTATION Left    50 years ago   TOTAL KNEE ARTHROPLASTY Right 11/15/2013   Procedure: RIGHT TOTAL KNEE ARTHROPLASTY;  Surgeon: Mauri Pole, MD;  Location: [facility];  Service: Orthopedics;  Laterality: Right;   TOTAL KNEE ARTHROPLASTY Left 03/07/2015   Procedure: LEFT TOTAL KNEE ARTHROPLASTY;  Surgeon: Paralee Cancel, MD;  Location: [facility];  Service: Orthopedics;  Laterality: Left;     Allergies  Allergen Reactions   Metformin Nausea And Vomiting   Niacin    Niacin And Related Other (See Comments)    Reaction: flushing and burning side effects even with extended release      Family History  Problem Relation Age of Onset   Hypertension Other    Cancer Other    Heart disease Other    Diabetes Other    Hypertension Mother    Cancer Father        brain   CAD Brother    Colon cancer Neg Hx      Social History Mr. Mclester reports that he quit smoking about 30 years ago. His smoking use included cigarettes. He started smoking about 57 years ago. He has a 30.00 pack-year smoking history. He has never used smokeless tobacco. Mr. Huttner  reports no history of alcohol use.   Review of Systems CONSTITUTIONAL: No weight loss, fever, chills, weakness or fatigue.  HEENT: Eyes: No visual loss, blurred vision, double vision or yellow sclerae.No hearing loss, sneezing, congestion, runny nose or sore throat.  SKIN: No rash or itching.  CARDIOVASCULAR:  RESPIRATORY: No shortness of breath, cough or sputum.  GASTROINTESTINAL: No anorexia, nausea, vomiting or diarrhea. No abdominal pain or blood.  GENITOURINARY: No burning on urination, no polyuria NEUROLOGICAL: No headache, dizziness, syncope, paralysis, ataxia, numbness or tingling in the extremities. No change in bowel or bladder control.  MUSCULOSKELETAL: No muscle, back pain, joint pain or stiffness.  LYMPHATICS: No enlarged nodes. No history of splenectomy.  PSYCHIATRIC: No history of depression or anxiety.  ENDOCRINOLOGIC: No reports of sweating, cold or heat intolerance. No polyuria or polydipsia.  Marland Kitchen   Physical Examination There were no vitals filed for this visit. There were no vitals filed for this visit.  Gen: resting comfortably, no acute distress HEENT: no scleral icterus, pupils equal round and reactive, no palptable cervical adenopathy,  CV Resp: Clear to auscultation bilaterally GI: abdomen is soft, non-tender, non-distended, normal bowel sounds, no hepatosplenomegaly MSK: extremities are warm, no edema.  Skin: warm, no rash Neuro:  no focal deficits Psych: appropriate affect   Diagnostic Studies  03/2020 echo IMPRESSIONS     1. Left ventricular ejection fraction, by estimation, is 55 to 60%. The  left ventricle has normal function. The left ventricle has no regional  wall motion abnormalities. There is mild left ventricular hypertrophy.  Left ventricular diastolic parameters  are consistent with Grade I diastolic dysfunction (impaired relaxation).   2. Right ventricular systolic function is normal. The right ventricular  size is normal.   3. Left  atrial size was mildly dilated.   4. The mitral valve is normal in structure. No evidence of mitral valve  regurgitation. No evidence of mitral stenosis.   5. The aortic valve is tricuspid. Aortic valve regurgitation is not  visualized. No aortic stenosis is present.      Jan 2022 CTA IMPRESSION: Mesenteric arterial disease is present, however, the CT does not support chronic mesenteric ischemia as a source of abdominal pain given all 3 mesenteric arteries are patent, and the greatest degree of narrowing is estimated 50% of the proximal SMA.   Bilateral renal arterial disease, worst on the right where there appears to be high-grade stenosis and associated asymmetric right renal parenchyma.   Aortic Atherosclerosis (ICD10-I70.0).   Mild bilateral iliac arterial disease without high-grade stenosis or occlusion.  11/2022 cath  Prox RCA-1 lesion is 50% stenosed. Prox RCA-2 lesion is 20% stenosed.   Mid RCA to Dist RCA lesion is 65% stenosed.   RPDA lesion is 60% stenosed.   RPAV lesion is 15% stenosed.  2nd RPL lesion is 30% stenosed.   Dist LAD-1 lesion is 55% stenosed. Dist/apical LAD-2 lesion is 75% stenosed.  Dist/apical LAD-2 lesion is 75% stenosed.   Prox 3rd Mrg is 40% stenosed   LV end diastolic pressure is normal.   POST CATH DIAGNOSES Mulitvessel diffuse Moderate CAD with no acute or obvious Flow-Limiting lesions. Prox RCA eccentril 50-55%, distal RCA eccentric calcified shelf lesion ~60-65% & ostial rPDA 60% --> In tandem could potentialy be RFR/FFR positive. However, in the absence of any ongoing active anginal symptoms, would not PCI in the preop setting. Early distal LAD eccentric 55 to 60% and apical 70% stenosed this cannot amenable for PCI. Otherwise diffuse mild to moderate disease in the LCx Normal LVEDP-normal EF by Echo      RECOMMENDATIONS Aggressive to direct medical therapy for moderate diffuse CAD.  Monitor for symptoms.  But for now would simply treat  medically. Would proceed with planned operation without further cardiac evaluation given nonobstructive CAD and normal echo.   11/2022 echo 1. Limited Echo to evaluate LVEF   2. Left ventricular ejection fraction, by estimation, is 50 to 55%. The  left ventricle has low normal function. The left ventricle has no regional  wall motion abnormalities. Left ventricular diastolic function could not  be evaluated.   3. The mitral valve is grossly normal. Mild mitral valve regurgitation.  No evidence of mitral stenosis.   4. The inferior vena cava is normal in size with greater than 50%  respiratory variability, suggesting right atrial pressure of 3 mmHg.   07/2022 echo 1. Limited echo for LVEF and regional wall motion.   2. No apical thrombus by Definity contrast. Left ventricular ejection  fraction, by estimation, is 35 to 40%. Left ventricular ejection fraction  by 2D MOD biplane is 36.6 %. The left ventricle has moderately decreased  function. The left ventricle  demonstrates severe global hypokinesis, worse at the apex. Regional wall  motion is difficult to ascertain given frequent PVC's.   3. The inferior vena cava is dilated in size with <50% respiratory  variability, suggesting right atrial pressure of 15 mmHg.   4. Right ventricular systolic function was not well visualized. The right  ventricular size is not well visualized.   5. Rhythm strip during this exam demonstrates atrial fibrillation.   Assessment and Plan   1. CAD - isolated episode of chest pain, negative workup in hospital as far as EKG, trops, echo - monitor at this time, if significant recurrence consider ischemic testing.    2. HTN - at goal, continue current meds. ARB stopped by neprhology due to AKI   3. Chronic diastolic - neprhology recently decreased diuretic due to AKI, defer dosing to Dr Theador Hawthorne.      Arnoldo Lenis, M.D., F.A.C.C.

## 2022-12-30 NOTE — Progress Notes (Signed)
Subjective: No current complaints attempting to eat breakfast, hemodialysis later today on schedule  Objective Vital signs in last 24 hours: Vitals:   12/30/22 0600 12/30/22 0632 12/30/22 0700 12/30/22 1424  BP:    (!) 111/50  Pulse: 85 84 88 92  Resp: (!) '22 17 19 17  '$ Temp:    98.2 F (36.8 C)  TempSrc:    Oral  SpO2: 100% 98% 100% 99%  Weight: 86 kg     Height:       Weight change:   Physical Exam: General: Chronically ill adult male Heart: RRR no MRG Lungs: CTA bilaterally nonlabored breathing Abdomen: NABS soft NTND Extremities: No lower extremity edema  dialysis Access: Right IJ TDC dressing dry clear   Dialysis Orders: [facility] MWF 4h  400/800  84.5kg  2/2.5 bath   Heparin 4000 units IV  RIJ TDC - last OP HD 12/20, post wt 85.9kg - last IP hD 12/24, post wt 85kg - mircera 225 mcg IV q2, last 12/18, due 1/02 - no vdra   Problem/Plan:  Chronic cholecystitis - w/ nausea, malnutrition, FTT.  S/p laparoscopic cholecystectomy  ESRD - on HD MWF. Continue on schedule. Next HD 1/8 Sacral decubitus ulcer =On IV antibiotics per primary undergoing hydrotherapy, temp 101 today, empiric Vanco and Zosyn per admit team sacral MRI ordered per admit team, admit team consulted surgery no surgical intervention currently needed Chronic HFrEF - LVEF up to 50-55% here, diffuse CAD.  HTN/ volume -BP stable. No volume exces UF as tolerated. 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  Status post 1 unit PRBC, Aranesp 200 q Friday. Tsat low but infection picture and ferritin high. 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 On regular diet + protein supp. Coretrak placed -per RD  Medication - Gabapentin dose inappropriate for ESRD pt. -Unclear indication -will d/c for now. Can resume at 300 mg q day if needed.  Denying neuropathy pain this a.m.  Ernest Haber, PA-C [facility] [phone] 12/30/2022,3:07 PM  LOS: 17 days    Labs: Basic Metabolic Panel: Recent Labs  Lab 12/28/22 0218 12/28/22 0219 12/29/22 0132 12/30/22 0156  NA 135  --  137 140  K 3.6  --  4.0 4.4  CL 99  --  96* 100  CO2 29  --  28 27  GLUCOSE 201*  --  200* 171*  BUN 32*  --  67* 88*  CREATININE 2.59*  --  3.89* 4.98*  CALCIUM 9.0  --  9.4 9.5  PHOS 1.5* 1.5* 1.6* 3.1  3.1   Liver Function Tests: Recent Labs  Lab 12/28/22 0218 12/29/22 0132 12/30/22 0156  ALBUMIN 2.2* 2.1* 2.1*   No results for input(s): "LIPASE", "AMYLASE" in the last 168 hours. No results for input(s): "AMMONIA" in the last 168 hours. CBC: Recent Labs  Lab 12/24/22 0239 12/25/22 0417 12/27/22 0336  WBC 8.9 11.6* 10.4  HGB 8.0* 7.7* 8.8*  HCT 25.7* 25.7* 28.1*  MCV 102.4* 104.0* 101.8*  PLT 266 314 329   Cardiac Enzymes: No results for input(s): "CKTOTAL", "CKMB", "CKMBINDEX", "TROPONINI" in the last 168 hours. CBG: Recent Labs  Lab 12/29/22 2100 12/30/22 0007 12/30/22 0423 12/30/22 0748 12/30/22 1219  GLUCAP 171* 188* 176* 177* 206*    Studies/Results: No results found. Medications:  ceFEPime (MAXIPIME) IV Stopped (12/29/22 2138)   linezolid (ZYVOX) IV 600 mg (12/30/22 1142)  metronidazole 500 mg (12/30/22 1036)    allopurinol  100 mg Oral Daily   aspirin EC  81 mg Oral Daily   atorvastatin  40 mg Oral Daily   Chlorhexidine Gluconate Cloth  6 each Topical Q0600   cycloSPORINE  1 drop Both Eyes BID   darbepoetin (ARANESP) injection - DIALYSIS  200 mcg Subcutaneous Q Fri-1800   feeding supplement  237 mL Oral TID WC   feeding supplement (OSMOLITE 1.5 CAL)  1,000 mL Per Tube Q24H   feeding supplement (PROSource TF20)  60 mL Per Tube BID   Gerhardt's butt cream   Topical BID   heparin  5,000 Units Subcutaneous Q8H   isosorbide dinitrate  5 mg Oral BID   melatonin  4.5 mg Oral QHS   multivitamin  1 tablet Oral QHS   nutrition supplement (JUVEN)  1 packet Per Tube BID BM   polyethylene glycol  17 g Oral Daily    senna-docusate  1 tablet Oral BID   sodium chloride flush  3 mL Intravenous Q12H

## 2022-12-30 NOTE — Progress Notes (Signed)
Physical Therapy Treatment Patient Details Name: [name] MRN: [id number] DOB: [birth year] Today's Date: 12/30/2022   History of Present Illness 77 yo male admitted to [facility] 12/21 for cholecystectomy which was canceled due to hypokalemia and prolonged Qtc. Transfer to [facility] 12/26 with cardiac cath performed same date. 12/28 lap chole. PMHx: ESRD on HD MWF, HFrEF, DM, HTN, anxiety, HLD, Bil TKA, gout, GERD    PT Comments    Pt significantly limited by pain. Unable to tolerate transition to sitting. Continually trying to self position into R sidelying to off weight sacrum. AAROM exercises BLE.    Recommendations for follow up therapy are one component of a multi-disciplinary discharge planning process, led by the attending physician.  Recommendations may be updated based on patient status, additional functional criteria and insurance authorization.  Follow Up Recommendations  Skilled nursing-short term rehab (<3 hours/day) Can patient physically be transported by private vehicle: No   Assistance Recommended at Discharge Frequent or constant Supervision/Assistance  Patient can return home with the following Two people to help with walking and/or transfers;Two people to help with bathing/dressing/bathroom;Assistance with cooking/housework;Assistance with feeding;Assist for transportation;Direct supervision/assist for medications management;Direct supervision/assist for financial management   Equipment Recommendations  Hospital bed;Wheelchair (measurements PT);Wheelchair cushion (measurements PT);Other (comment) (hoyer lift, air matress)    Recommendations for Other Services       Precautions / Restrictions Precautions Precautions: Fall;Other (comment) Precaution Comments: sacral wound     Mobility  Bed Mobility Overal bed mobility: Needs Assistance       Supine to sit: HOB elevated, +2 for physical assistance, +2 for safety/equipment, Total assist Sit to supine: Total assist, +2  for physical assistance, +2 for safety/equipment, HOB elevated   General bed mobility comments: Heavy push posteriorly resulting in inability to attain upright sit. Mobility significantly limited by pain at sacral wound.    Transfers                   General transfer comment: unable    Ambulation/Gait                   Stairs             Wheelchair Mobility    Modified Rankin (Stroke Patients Only)       Balance Overall balance assessment: Needs assistance Sitting-balance support: Bilateral upper extremity supported, Feet supported Sitting balance-Leahy Scale: Zero   Postural control: Posterior lean                                  Cognition Arousal/Alertness: Awake/alert Behavior During Therapy: Flat affect Overall Cognitive Status: Impaired/Different from baseline Area of Impairment: Safety/judgement, Problem solving, Awareness, Attention, Following commands, Memory                   Current Attention Level: Focused Memory: Decreased short-term memory Following Commands: Follows one step commands inconsistently, Follows one step commands with increased time Safety/Judgement: Decreased awareness of deficits, Decreased awareness of safety Awareness: Intellectual Problem Solving: Slow processing, Difficulty sequencing, Decreased initiation, Requires verbal cues, Requires tactile cues General Comments: internally distracted by pain        Exercises General Exercises - Lower Extremity Ankle Circles/Pumps: AAROM, Right, Left, 5 reps Heel Slides: AAROM, Right, Left, 5 reps    General Comments        Pertinent Vitals/Pain Pain Assessment Pain Assessment: Faces Faces Pain Scale: Hurts worst Pain Location: sacrum  Pain Descriptors / Indicators: Moaning, Discomfort, Grimacing, Guarding Pain Intervention(s): Limited activity within patient's tolerance, Patient requesting pain meds-RN notified, Repositioned, Monitored  during session    Home Living                          Prior Function            PT Goals (current goals can now be found in the care plan section) Acute Rehab PT Goals Patient Stated Goal: not stated Progress towards PT goals: Not progressing toward goals - comment (limited by pain)    Frequency    Min 2X/week      PT Plan Current plan remains appropriate    Co-evaluation              AM-PAC PT "6 Clicks" Mobility   Outcome Measure  Help needed turning from your back to your side while in a flat bed without using bedrails?: Total Help needed moving from lying on your back to sitting on the side of a flat bed without using bedrails?: Total Help needed moving to and from a bed to a chair (including a wheelchair)?: Total Help needed standing up from a chair using your arms (e.g., wheelchair or bedside chair)?: Total Help needed to walk in hospital room?: Total Help needed climbing 3-5 steps with a railing? : Total 6 Click Score: 6    End of Session   Activity Tolerance: Patient limited by pain Patient left: in bed;with call bell/phone within reach;with bed alarm set Nurse Communication: Patient requests pain meds PT Visit Diagnosis: Unsteadiness on feet (R26.81);Other abnormalities of gait and mobility (R26.89);Muscle weakness (generalized) (M62.81);Difficulty in walking, not elsewhere classified (R26.2)     Time: 2703-5009 PT Time Calculation (min) (ACUTE ONLY): 18 min  Charges:  $Therapeutic Activity: 8-22 mins                     Lorrin Goodell, PT  Office # [phone] Pager [phone]    Lorriane Shire 12/30/2022, 10:50 AM

## 2022-12-30 NOTE — Procedures (Signed)
HD Note:  Some information was entered later than the data was gathered due to patient care needs. The stated time with the data is accurate.  Received patient in bed to unit.   Informed consent signed and in chart.   Patient rested with eyes closed during treatment.   Transported back to the room  Alert, without acute distress.  Hand-off given to patient's nurse.   Access used: Right upper chest HD catheter Access issues: None     [facility]

## 2022-12-30 NOTE — TOC Progression Note (Addendum)
Transition of Care [facility]) - Progression Note    Patient Details  Name: [name] MRN: [id number] Date of Birth: 07-06-1946  Transition of Care [facility]) CM/SW Stickney, Roy Phone Number: 12/30/2022, 3:57 PM  Clinical Narrative:     Patient has SNF bed at [facility] when medically ready for dc. CSW provided Melissa with patients HD information.CSW following to start insurance authorization close to patient being medically ready for dc.   Expected Discharge Plan: [place] Barriers to Discharge: Continued Medical Work up  Expected Discharge Plan and Services In-house Referral: Clinical Social Work   Post Acute Care Choice: [place] Living arrangements for the past 2 months: [place] Determinants of Health (SDOH) Interventions SDOH Screenings   Food Insecurity: No Food Insecurity (09/13/2022)  Housing: Low Risk  (12/17/2022)  Transportation Needs: No Transportation Needs (12/17/2022)  Utilities: Not At Risk (12/17/2022)  Tobacco Use: Medium Risk (12/20/2022)    Readmission Risk Interventions    09/16/2022   12:31 PM 08/02/2022    2:23 PM  Readmission Risk Prevention Plan  Transportation Screening Complete Complete  Medication Review (RN Care Manager) Complete Complete  HRI or Home Care Consult Complete Complete  SW Recovery Care/Counseling Consult Complete Complete  Palliative Care Screening Not Applicable Not Applicable  Skilled Nursing Facility Not Applicable Complete

## 2022-12-31 DIAGNOSIS — K811 Chronic cholecystitis: Secondary | ICD-10-CM | POA: Diagnosis not present

## 2022-12-31 DIAGNOSIS — D649 Anemia, unspecified: Secondary | ICD-10-CM | POA: Diagnosis not present

## 2022-12-31 DIAGNOSIS — N186 End stage renal disease: Secondary | ICD-10-CM | POA: Diagnosis not present

## 2022-12-31 DIAGNOSIS — E1169 Type 2 diabetes mellitus with other specified complication: Secondary | ICD-10-CM | POA: Diagnosis not present

## 2022-12-31 LAB — RENAL FUNCTION PANEL
Albumin: 1.9 g/dL — ABNORMAL LOW (ref 3.5–5.0)
Anion gap: 9 (ref 5–15)
BUN: 62 mg/dL — ABNORMAL HIGH (ref 8–23)
CO2: 28 mmol/L (ref 22–32)
Calcium: 9.1 mg/dL (ref 8.9–10.3)
Chloride: 96 mmol/L — ABNORMAL LOW (ref 98–111)
Creatinine, Ser: 3.18 mg/dL — ABNORMAL HIGH (ref 0.61–1.24)
GFR, Estimated: 19 mL/min — ABNORMAL LOW (ref >60)
Glucose, Bld: 181 mg/dL — ABNORMAL HIGH (ref 70–99)
Phosphorus: 2.1 mg/dL — ABNORMAL LOW (ref 2.5–4.6)
Potassium: 4.4 mmol/L (ref 3.5–5.1)
Sodium: 133 mmol/L — ABNORMAL LOW (ref 135–145)

## 2022-12-31 LAB — CBC
HCT: 26.1 % — ABNORMAL LOW (ref 39.0–52.0)
Hemoglobin: 8.4 g/dL — ABNORMAL LOW (ref 13.0–17.0)
MCH: 31.2 pg (ref 26.0–34.0)
MCHC: 32.2 g/dL (ref 30.0–36.0)
MCV: 97 fL (ref 80.0–100.0)
Platelets: 358 10*3/uL (ref 150–400)
RBC: 2.69 MIL/uL — ABNORMAL LOW (ref 4.22–5.81)
RDW: 18.9 % — ABNORMAL HIGH (ref 11.5–15.5)
WBC: 11.1 10*3/uL — ABNORMAL HIGH (ref 4.0–10.5)
nRBC: 0 % (ref 0.0–0.2)

## 2022-12-31 LAB — GLUCOSE, CAPILLARY
Glucose-Capillary: 126 mg/dL — ABNORMAL HIGH (ref 70–99)
Glucose-Capillary: 143 mg/dL — ABNORMAL HIGH (ref 70–99)
Glucose-Capillary: 158 mg/dL — ABNORMAL HIGH (ref 70–99)
Glucose-Capillary: 160 mg/dL — ABNORMAL HIGH (ref 70–99)
Glucose-Capillary: 190 mg/dL — ABNORMAL HIGH (ref 70–99)
Glucose-Capillary: 198 mg/dL — ABNORMAL HIGH (ref 70–99)

## 2022-12-31 MED ORDER — K PHOS MONO-SOD PHOS DI & MONO 155-852-130 MG PO TABS
250.0000 mg | ORAL_TABLET | Freq: Two times a day (BID) | ORAL | Status: AC
  Administered 2022-12-31 – 2023-01-01 (×4): 250 mg via ORAL
  Filled 2022-12-31 (×4): qty 1

## 2022-12-31 MED ORDER — MEDIHONEY WOUND/BURN DRESSING EX PSTE
1.0000 | PASTE | Freq: Every day | CUTANEOUS | Status: DC
  Administered 2023-01-01 – 2023-01-10 (×9): 1 via TOPICAL
  Filled 2022-12-31 (×3): qty 44

## 2022-12-31 NOTE — Consult Note (Addendum)
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  Cover with foam dressing.  Change foam dressing Q 3 days or PRN soiling.  Wound has improved, no longer requires hydrotherapy or Dakins.   Discussed POC with patient and bedside nurse.  Re consult if needed, will not follow at this time. Thanks [employer] MSN, RN-BC, [employer]

## 2022-12-31 NOTE — Progress Notes (Signed)
Subjective: Seen in room no complaints, noted dialysis late last night till 11 PM.  Patient does not remember dialysis.  Tolerated 1.5 L UF  Objective Vital signs in last 24 hours: Vitals:   12/31/22 0231 12/31/22 0333 12/31/22 0409 12/31/22 0817  BP: (!) 108/45 101/61  (!) 87/50  Pulse: 80 82  83  Resp: '16 13  16  '$ Temp: 98 F (36.7 C) 97.9 F (36.6 C)  97.8 F (36.6 C)  TempSrc: Axillary Axillary  Axillary  SpO2: 98% 100%  100%  Weight:   85 kg   Height:       Weight change: 2.268 kg  Physical Exam: General: Chronically ill adult male, NAD  Heart: RRR no MRG Lungs: CTA bilaterally nonlabored breathing Abdomen: NABS soft NTND, NG tube present Extremities: No lower extremity edema  dialysis Access: Right IJ TDC dressing dry clear   Dialysis Orders: [facility] MWF 4h  400/800  84.5kg  2/2.5 bath   Heparin 4000 units IV  RIJ TDC - last OP HD 12/20, post wt 85.9kg - last IP hD 12/24, post wt 85kg - mircera 225 mcg IV q2, last 12/18, due 1/02 - no vdra     Problem/Plan:  Chronic cholecystitis - w/ nausea, malnutrition, FTT.  S/p laparoscopic cholecystectomy 12/19/2022  ESRD - on HD MWF. Continue on schedule. Next HD 01/10 Sacral decubitus ulcer =On IV antibiotics per primary undergoing hydrotherapy, afebrile today, empiric Vanco and Zosyn per admit team sacral MRI "not done as patient has SNS "per admit team, admit team consulted surgery no surgical intervention currently needed Chronic HFrEF - LVEF up to 50-55% here, diffuse CAD.  HTN/ volume -BP stable. No volume exces UF as tolerated. Anemia esrd -Hgb 6.3 yesterday??  Accuracy, this a.m. . 8.4. //Prior status post 1 unit PRBC, Aranesp 200 q Friday. Tsat low but infection picture and ferritin high. 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 albumin low 1.9 PCM, FTT. On regular diet + protein supp. Coretrak  placed -for nutrition nutrition per admit plus per RD following Medication -on admit was on gabapentin dose inappropriate for ESRD pt. -Unclear indication -will d/c for now. Can resume at 300 mg q day if needed.  Denying neuropathy pain this a.m.  Ernest Haber, PA-C [facility]per [phone] 12/31/2022,10:30 AM  LOS: 18 days   Labs: Basic Metabolic Panel: Recent Labs  Lab 12/29/22 0132 12/30/22 0156 12/31/22 0617  NA 137 140 133*  K 4.0 4.4 4.4  CL 96* 100 96*  CO2 '28 27 28  '$ GLUCOSE 200* 171* 181*  BUN 67* 88* 62*  CREATININE 3.89* 4.98* 3.18*  CALCIUM 9.4 9.5 9.1  PHOS 1.6* 3.1  3.1 2.1*   Liver Function Tests: Recent Labs  Lab 12/29/22 0132 12/30/22 0156 12/31/22 0617  ALBUMIN 2.1* 2.1* 1.9*   No results for input(s): "LIPASE", "AMYLASE" in the last 168 hours. No results for input(s): "AMMONIA" in the last 168 hours. CBC: Recent Labs  Lab 12/25/22 0417 12/27/22 0336 12/30/22 1614 12/31/22 0617  WBC 11.6* 10.4 12.6* 11.1*  HGB 7.7* 8.8* 6.3* 8.4*  HCT 25.7* 28.1* 20.4* 26.1*  MCV 104.0* 101.8* 102.0* 97.0  PLT 314 329 371 358   Cardiac Enzymes: No results for input(s): "CKTOTAL", "CKMB", "CKMBINDEX", "TROPONINI" in the last 168 hours. CBG: Recent Labs  Lab 12/30/22  1219 12/30/22 2113 12/31/22 0044 12/31/22 0413 12/31/22 0813  GLUCAP 206* 161* 160* 143* 190*    Studies/Results: No results found. Medications:  ampicillin (OMNIPEN) IV 2 g (12/31/22 0944)   ceFEPime (MAXIPIME) IV 1 g (12/30/22 2128)   metronidazole 500 mg (12/30/22 2335)    sodium chloride   Intravenous Once   allopurinol  100 mg Oral Daily   aspirin EC  81 mg Oral Daily   atorvastatin  40 mg Oral Daily   Chlorhexidine Gluconate Cloth  6 each Topical Q0600   cycloSPORINE  1 drop Both Eyes BID   darbepoetin (ARANESP) injection - DIALYSIS  200 mcg Subcutaneous Q Fri-1800   feeding supplement  237 mL Oral TID WC   feeding supplement (OSMOLITE 1.5 CAL)  1,000 mL  Per Tube Q24H   feeding supplement (PROSource TF20)  60 mL Per Tube BID   Gerhardt's butt cream   Topical BID   heparin  5,000 Units Subcutaneous Q8H   heparin sodium (porcine)  4,000 Units Intravenous Once   isosorbide dinitrate  5 mg Oral BID   melatonin  4.5 mg Oral QHS   multivitamin  1 tablet Oral QHS   nutrition supplement (JUVEN)  1 packet Per Tube BID BM   polyethylene glycol  17 g Oral Daily   senna-docusate  1 tablet Oral BID   sodium chloride flush  3 mL Intravenous Q12H

## 2022-12-31 NOTE — Plan of Care (Signed)

## 2022-12-31 NOTE — Progress Notes (Signed)
Mobility Specialist - Progress Note   12/31/22 1526  Mobility  Level of Assistance Maximum assist, patient does 25-49%  Range of Motion/Exercises All extremities;Active  Activity Response Tolerated well  Mobility Referral Yes  $Mobility charge 1 Mobility   Pt received in bed and agreeable to bed level mobility. Pt c/o soreness and stiffness. Pt left in supine with all needs met.   Franki Monte  Mobility Specialist Please contact via Solicitor or Rehab office at [phone]

## 2022-12-31 NOTE — Progress Notes (Signed)
[facility]  PROGRESS NOTE  PRIMUS GRITTON XLK:[id number] DOB: [birth year] DOA: 12/12/2022 PCP: Redmond School, MD   Brief HPI:   77 year old male with past medical history of ESRD on hemodialysis Monday Wednesday Friday, HFrEF, diabetes mellitus type 2, hypertension who presented with nausea.  Patient has known history of cholecystitis and underwent cholecystostomy tube placement.  His tube had become dislodged and he underwent evaluation with surgery.  He was noted to have recanalization of the bile duct therefore tube was not replaced.  He came to hospital for elective cholecystectomy underwent preop evaluation with cardiology.  His workup was notable for diffuse T wave inversions and prolonged QTc.  Also was found to be significantly hypokalemic.  He was started on electrolyte replacement.  Nephrology was consulted for chronic hemodialysis during hospitalization.  Despite improvement of Aulik parameters, ECG changes persisted so he was transferred to [facility] for cardiac catheterization on 12/17/2022.    Subjective   Patient seen and examined, denies pain.  Getting tube feeds at bedtime to assist with healing of decubitus ulcer.  Wound care reconsulted today, patient has been getting hydrotherapy.   Assessment/Plan:     Chronic cholecystitis -Presented with malnutrition, failure to thrive -General surgery was consulted, underwent cholecystectomy on 12/28 with intraoperative cholangiogram  -Per general surgery; pneumoperitoneum is likely from lap chole done last week -No further intervention recommended  Chronic HFrEF, abnormal ECG -Repeat echo during this admission showed EF of 50 to 55% and 35 to 40% in August 2023 -Continue cardiac monitoring -Left heart cath on 12/26 showed multivessel diffuse moderate CAD with no acute or obvious flow-limiting lesions(in absence of any active anginal symptoms, would not PCI in preop setting)  -Medical management recommended, okayed to proceed  with surgery as per cardiology -Started on high-dose statin  ESRD -Nephrology consulted for hemodialysis  Anemia of ESRD -ESA per nephrology -S/p 2 unit PRBC during this hospitalization -Hemoglobin stable at 8.4 this morning  Unstageable sacral ulcer -Foul-smelling decubitus wound noted which progressed as compared to previous imaging from 12/21 -Wound care consulted -Today after hydrotherapy patient developed fever with temperature 101 F -Blood cultures x 2 obtained, wound culture obtained;  -wound culture growing Enterococcus faecalis, Proteus mirabilis and gram-positive cocci -Started on empiric vancomycin and Zosyn -antibiotics were changed  to ampicillin, cefepime -sacral MRI ordered, 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 -Patient has been getting hydrotherapy for past 1 week -Wound care reconsulted today, recommended to stop hydrotherapy as wound has healed -Continue oxycodone 5 mg every 4 hours as needed   Diabetes mellitus type 2 A1c 4.4 (likely reflecting his recent failure to thrive, poor PO intake) D/c SSI   Functional Quadriplegia  Essentially bedbound for the past year (needs help toileting, turning in bed, dressing, etc) -Unclear cause Follow outpatient  Hypophosphatemia -Replete  Hypomagnesemia -Replete  Protein calorie malnutrition -Patient has poor p.o. intake -BMI 22.98 kg/m -Started on core track nighttime feeding -Continue p.o. diet during daytime -core track feeding tube placed for additional nutrition to think subjective help heal sacral ulcer. -As patient's sacral wound is healing well.  Consider stopping core track tube feeding over next 24 to 48 hours and assess p.o. intake   Medications     sodium chloride   Intravenous Once   allopurinol  100 mg Oral Daily   aspirin EC  81 mg Oral Daily  atorvastatin  40  mg Oral Daily   Chlorhexidine Gluconate Cloth  6 each Topical Q0600   cycloSPORINE  1 drop Both Eyes BID   darbepoetin (ARANESP) injection - DIALYSIS  200 mcg Subcutaneous Q Fri-1800   feeding supplement  237 mL Oral TID WC   feeding supplement (OSMOLITE 1.5 CAL)  1,000 mL Per Tube Q24H   feeding supplement (PROSource TF20)  60 mL Per Tube BID   Gerhardt's butt cream   Topical BID   heparin  5,000 Units Subcutaneous Q8H   heparin sodium (porcine)  4,000 Units Intravenous Once   isosorbide dinitrate  5 mg Oral BID   melatonin  4.5 mg Oral QHS   multivitamin  1 tablet Oral QHS   nutrition supplement (JUVEN)  1 packet Per Tube BID BM   polyethylene glycol  17 g Oral Daily   senna-docusate  1 tablet Oral BID   sodium chloride flush  3 mL Intravenous Q12H     Data Reviewed:   CBG:  Recent Labs  Lab 12/30/22 1219 12/30/22 2113 12/31/22 0044 12/31/22 0413 12/31/22 0813  GLUCAP 206* 161* 160* 143* 190*    SpO2: 100 % O2 Flow Rate (L/min): 3 L/min    Vitals:   12/31/22 0231 12/31/22 0333 12/31/22 0409 12/31/22 0817  BP: (!) 108/45 101/61  (!) 87/50  Pulse: 80 82  83  Resp: '16 13  16  '$ Temp: 98 F (36.7 C) 97.9 F (36.6 C)  97.8 F (36.6 C)  TempSrc: Axillary Axillary  Axillary  SpO2: 98% 100%  100%  Weight:   85 kg   Height:          Data Reviewed:  Basic Metabolic Panel: Recent Labs  Lab 12/26/22 0148 12/27/22 0336 12/28/22 0218 12/28/22 0219 12/29/22 0132 12/30/22 0156  NA 134* 133* 135  --  137 140  K 4.8 4.8 3.6  --  4.0 4.4  CL 94* 97* 99  --  96* 100  CO2 '27 26 29  '$ --  28 27  GLUCOSE 121* 119* 201*  --  200* 171*  BUN 18 33* 32*  --  67* 88*  CREATININE 2.66* 3.33* 2.59*  --  3.89* 4.98*  CALCIUM 9.0 9.5 9.0  --  9.4 9.5  MG  --   --   --  1.6* 2.1 2.0  PHOS 3.1 3.7 1.5* 1.5* 1.6* 3.1  3.1    CBC: Recent Labs  Lab 12/25/22 0417 12/27/22 0336 12/30/22 1614 12/31/22 0617  WBC 11.6* 10.4 12.6* 11.1*  HGB 7.7* 8.8* 6.3* 8.4*  HCT 25.7*  28.1* 20.4* 26.1*  MCV 104.0* 101.8* 102.0* 97.0  PLT 314 329 371 358    LFT Recent Labs  Lab 12/26/22 0148 12/27/22 0336 12/28/22 0218 12/29/22 0132 12/30/22 0156  ALBUMIN 2.1* 2.2* 2.2* 2.1* 2.1*     Antibiotics: Anti-infectives (From admission, onward)    Start     Dose/Rate Route Frequency Ordered Stop   12/30/22 2200  ampicillin (OMNIPEN) 2 g in sodium chloride 0.9 % 100 mL IVPB        2 g 300 mL/hr over 20 Minutes Intravenous Every 12 hours 12/30/22 1519 01/06/23 0959   12/30/22 1145  linezolid (ZYVOX) IVPB 600 mg  Status:  Discontinued        600 mg 300 mL/hr over 60 Minutes Intravenous Every 12 hours 12/30/22 1056 12/30/22 1519   12/28/22 0000  vancomycin (VANCOCIN) IVPB 1000 mg/200 mL premix  Status:  Discontinued  1,000 mg 200 mL/hr over 60 Minutes Intravenous Every M-W-F (Hemodialysis) 12/27/22 1354 12/30/22 1056   12/27/22 1445  vancomycin (VANCOCIN) IVPB 1000 mg/200 mL premix        1,000 mg 200 mL/hr over 60 Minutes Intravenous  Once 12/27/22 1354 12/27/22 1738   12/27/22 1200  vancomycin (VANCOCIN) IVPB 1000 mg/200 mL premix  Status:  Discontinued        1,000 mg 200 mL/hr over 60 Minutes Intravenous Every M-W-F (Hemodialysis) 12/25/22 1831 12/27/22 1354   12/26/22 2100  ceFEPIme (MAXIPIME) 1 g in sodium chloride 0.9 % 100 mL IVPB        1 g 200 mL/hr over 30 Minutes Intravenous Every 24 hours 12/26/22 1052 12/31/22 2359   12/25/22 1900  ceFEPIme (MAXIPIME) 1 g in sodium chloride 0.9 % 100 mL IVPB  Status:  Discontinued        1 g 200 mL/hr over 30 Minutes Intravenous Every 24 hours 12/25/22 1812 12/26/22 1052   12/25/22 1900  vancomycin (VANCOREADY) IVPB 1750 mg/350 mL        1,750 mg 175 mL/hr over 120 Minutes Intravenous  Once 12/25/22 1812 12/26/22 0146   12/25/22 1900  metroNIDAZOLE (FLAGYL) IVPB 500 mg        500 mg 100 mL/hr over 60 Minutes Intravenous Every 12 hours 12/25/22 1812 12/31/22 2359   12/13/22 0600  cefoTEtan (CEFOTAN) 2 g in  sodium chloride 0.9 % 100 mL IVPB  Status:  Discontinued        2 g 200 mL/hr over 30 Minutes Intravenous On call to O.R. 12/12/22 1631 12/14/22 0559   12/12/22 0615  cefoTEtan (CEFOTAN) 2 g in sodium chloride 0.9 % 100 mL IVPB  Status:  Discontinued        2 g 200 mL/hr over 30 Minutes Intravenous On call to O.R. 12/12/22 5631 12/12/22 0913        DVT prophylaxis: Heparin  Code Status: Full code  Family Communication: No family at bedside   CONSULTS General surgery   Objective    Physical Examination:  Appears in no acute distress S1-S2, regular Abdomen is soft, nontender, no organomegaly     Status is: Inpatient:      Pressure Injury 11/06/22 Sacrum Mid Unstageable - Full thickness tissue loss in which the base of the injury is covered by slough (yellow, tan, gray, green or brown) and/or eschar (tan, brown or black) in the wound bed. reddended area to the [place] with (Active)  11/06/22 1315  Location: Sacrum  Location Orientation: Mid  Staging: Unstageable - Full thickness tissue loss in which the base of the injury is covered by slough (yellow, tan, gray, green or brown) and/or eschar (tan, brown or black) in the wound bed.  Wound Description (Comments): reddended area to the sacruum with yellow eschar and foul smell  Present on Admission: Yes        [place]   [facility] If 7PM-7AM, please contact night-coverage at [email or web address], Office  [phone]   12/31/2022, 9:18 AM  LOS: 18 days

## 2022-12-31 NOTE — Progress Notes (Signed)
Nutrition Follow-up  DOCUMENTATION CODES:   Non-severe (moderate) malnutrition in context of chronic illness  INTERVENTION:  Continue nocturnal TF regimen via Cortrak tube in setting of inadequate oral intake: -Osmolite 1.5 at 70 mL/hour x 12 hours overnight from 1800-0600 -PROSource TF20 60 mL BID per tube -Goal regimen provides: 1420 kcal, 93 grams of protein, 638 mL H2O daily -Meets 62% minimum estimated kcal needs and 78% minimum estimated protein needs  Continue Juven 1 packet per tube BID. Each packet provides 95 kcal, 7 grams L-Arginine, 7 grams L-Glutamine, 2.5 grams collagen protein, 300 mg vitamin C, 9.5 mg zinc, and other micronutrients essential for wound healing.  Continue liberalized, regular diet. Provide 1:1 assistance at meals.  Continue Ensure Enlive po TID, each supplement provides 350 kcal and 20 grams of protein.  Continue Rena-vite po QHS.  Consider goals of care discussion regarding desire for long-term nutrition support (G-tube) in setting of malnutrition status and poor PO intake as pt cannot discharge to SNF with Cortrak tube in place. If pt/family does not desire G-tube placement, recommend maximizing intake with oral nutrition supplements and assistance at meals.  NUTRITION DIAGNOSIS:   Moderate Malnutrition related to chronic illness (ESRD on HD, chronic cholecystitis) as evidenced by moderate fat depletion, moderate muscle depletion, percent weight loss.  Ongoing.  GOAL:   Patient will meet greater than or equal to 90% of their needs  Met with assistance from nocturnal tube feeds.  MONITOR:   PO intake, Supplement acceptance, Labs, Weight trends, TF tolerance, I & O's, Skin  REASON FOR ASSESSMENT:   Malnutrition Screening Tool    ASSESSMENT:   77 year old male with PMHx of ESRD on HD M/W/F, HFrEF, DM type 2, HTN, chronic cholecystitis s/p cholecystostomy tube placement that was later dislodged who presented with nausea. Pt underwent  cholecystectomy on 12/28 with intraoperative cholangiogram. Pt also with unstageable sacral ulcer that progressed as compared to previous imaging 12/21; no debridement recommended by general surgery.  12/28: s/p laparoscopic cholecystectomy with intraoperative ICG cholangiography   Met with pt at bedside. He is somewhat confused but reports appetite is decreased. Yesterday he ate 25% of breakfast and 50% of lunch. He missed dinner due to HD. He finished 1 full bottle of Ensure. During that 24 hour period he had approximately 861 kcal (37% minimum kcal needs) and 35 grams of protein (16% minimum estimated protein needs) from meals and oral nutrition supplements. RN reports intake has been even worse today. He only had 5% of breakfast this morning and wouldn't eat any more. Lunch tray had just arrived at time of RD assessment. Pt would continue to benefit from nocturnal tube feeds to meet estimated nutritional needs.  Pt was 98.1 kg on 08/23/22. Pt was 83.4 kg on 12/25/22. He lost 14.7 kg or 15% wt over 4 months, which is significant for time frame. Most recent wt is 85 kg. Will continue to monitor trends.  Enteral Access: 10 Fr. Cortrak tube placed 12/27/22, 80 cm at left nare secured with bridle; tip in expected region of stomach per abdominal x-ray 1/5  TF regimen: Osmolite 1.5 at 70 mL/hour x 12 hours overnight from 1800-0600 + PROsource TF20 60 mL BID  UOP: 150 mL (0.1 mL/kg/hr)  I/O: +1420 mL since 12/17/22  Medications reviewed and include: allopurinol, Ensure Enlive TID, Rena-vite QHS, Juven BID per tube, K Phos Neutral 250 mg BID, Miralax, senna-docusate 1 tablet BID, ampicillin, cefepime, Flagyl  Labs reviewed: CBG 126-198, Sodium 133, Chloride 96, BUN 62,  Creatinine 3.18, phosphorus 2.1  Diet Order:   Diet Order             Diet regular Room service appropriate? Yes; Fluid consistency: Thin  Diet effective now                  EDUCATION NEEDS:   Not appropriate for education at  this time  Skin:  Skin Assessment: Skin Integrity Issues: Skin Integrity Issues:: Stage IV, Incisions Stage IV: initially unstageable now evolved to stage 4 to sacrum (4 cm x 3 cm x 1.5 cm); wound has improved and no longer requires hydrotherapy or Dakins Unstageable: N/A Incisions: closed incision to abdomen  Last BM:  Unknown; 4 days ago last BM listed was 12/23/22 but no stool characteristics were documented  Height:   Ht Readings from Last 1 Encounters:  12/17/22 '6\' 3"'$  (1.905 m)   Weight:   Wt Readings from Last 1 Encounters:  12/31/22 85 kg   Ideal Body Weight:  89.1 kg  BMI:  Body mass index is 23.41 kg/m.  Estimated Nutritional Needs:   Kcal:  2300-2500  Protein:  120-130 grams  Fluid:  UOP + 1 L  Monay Houlton Magda Paganini, MS, RD, LDN, CNSC Pager number available on Amion

## 2022-12-31 NOTE — TOC Progression Note (Signed)
Transition of Care [facility]) - Progression Note    Patient Details  Name: [name] MRN: [id number] Date of Birth: October 25, 1946  Transition of Care [facility]) CM/SW [place], [place] Phone Number: 12/31/2022, 2:23 PM  Clinical Narrative:     Patient has SNF bed at [facility] . CSW following to start insurance authorization close to patient being medically ready for dc. CSW will continue to follow and assist with patients dc planning needs.  Expected Discharge Plan: [place] Barriers to Discharge: Continued Medical Work up  Expected Discharge Plan and Services In-house Referral: Clinical Social Work   Post Acute Care Choice: [place] Living arrangements for the past 2 months: [place] Determinants of Health (SDOH) Interventions SDOH Screenings   Food Insecurity: No Food Insecurity (09/13/2022)  Housing: Low Risk  (12/17/2022)  Transportation Needs: No Transportation Needs (12/17/2022)  Utilities: Not At Risk (12/17/2022)  Tobacco Use: Medium Risk (12/20/2022)    Readmission Risk Interventions    09/16/2022   12:31 PM 08/02/2022    2:23 PM  Readmission Risk Prevention Plan  Transportation Screening Complete Complete  Medication Review (RN Care Manager) Complete Complete  HRI or Home Care Consult Complete Complete  SW Recovery Care/Counseling Consult Complete Complete  Palliative Care Screening Not Applicable Not Applicable  Skilled Nursing Facility Not Applicable Complete

## 2023-01-01 DIAGNOSIS — K811 Chronic cholecystitis: Secondary | ICD-10-CM | POA: Diagnosis not present

## 2023-01-01 DIAGNOSIS — E1169 Type 2 diabetes mellitus with other specified complication: Secondary | ICD-10-CM | POA: Diagnosis not present

## 2023-01-01 DIAGNOSIS — D649 Anemia, unspecified: Secondary | ICD-10-CM | POA: Diagnosis not present

## 2023-01-01 DIAGNOSIS — N186 End stage renal disease: Secondary | ICD-10-CM | POA: Diagnosis not present

## 2023-01-01 LAB — CBC
HCT: 24 % — ABNORMAL LOW (ref 39.0–52.0)
Hemoglobin: 7.9 g/dL — ABNORMAL LOW (ref 13.0–17.0)
MCH: 32.1 pg (ref 26.0–34.0)
MCHC: 32.9 g/dL (ref 30.0–36.0)
MCV: 97.6 fL (ref 80.0–100.0)
Platelets: 364 10*3/uL (ref 150–400)
RBC: 2.46 MIL/uL — ABNORMAL LOW (ref 4.22–5.81)
RDW: 18.8 % — ABNORMAL HIGH (ref 11.5–15.5)
WBC: 11.6 10*3/uL — ABNORMAL HIGH (ref 4.0–10.5)
nRBC: 0 % (ref 0.0–0.2)

## 2023-01-01 LAB — GLUCOSE, CAPILLARY
Glucose-Capillary: 175 mg/dL — ABNORMAL HIGH (ref 70–99)
Glucose-Capillary: 165 mg/dL — ABNORMAL HIGH (ref 70–99)
Glucose-Capillary: 100 mg/dL — ABNORMAL HIGH (ref 70–99)
Glucose-Capillary: 130 mg/dL — ABNORMAL HIGH (ref 70–99)
Glucose-Capillary: 128 mg/dL — ABNORMAL HIGH (ref 70–99)

## 2023-01-01 LAB — RENAL FUNCTION PANEL
Albumin: 1.9 g/dL — ABNORMAL LOW (ref 3.5–5.0)
Anion gap: 11 (ref 5–15)
BUN: 93 mg/dL — ABNORMAL HIGH (ref 8–23)
CO2: 27 mmol/L (ref 22–32)
Calcium: 9.4 mg/dL (ref 8.9–10.3)
Chloride: 98 mmol/L (ref 98–111)
Creatinine, Ser: 4.01 mg/dL — ABNORMAL HIGH (ref 0.61–1.24)
GFR, Estimated: 15 mL/min — ABNORMAL LOW (ref >60)
Glucose, Bld: 159 mg/dL — ABNORMAL HIGH (ref 70–99)
Phosphorus: 2.3 mg/dL — ABNORMAL LOW (ref 2.5–4.6)
Potassium: 5.3 mmol/L — ABNORMAL HIGH (ref 3.5–5.1)
Sodium: 136 mmol/L (ref 135–145)

## 2023-01-01 MED ORDER — HEPARIN SODIUM (PORCINE) 1000 UNIT/ML IJ SOLN
INTRAMUSCULAR | Status: AC
  Administered 2023-01-01: 1000 [IU]
  Filled 2023-01-01: qty 4

## 2023-01-01 MED ORDER — HEPARIN SODIUM (PORCINE) 1000 UNIT/ML IJ SOLN
INTRAMUSCULAR | Status: AC
  Filled 2023-01-01: qty 4

## 2023-01-01 MED ORDER — HEPARIN SODIUM (PORCINE) 1000 UNIT/ML IJ SOLN
INTRAMUSCULAR | Status: AC
  Administered 2023-01-01: 4000 [IU]
  Filled 2023-01-01: qty 4

## 2023-01-01 NOTE — Progress Notes (Signed)
[facility]  PROGRESS NOTE  [name] GGY:[id number] DOB: [birth year] DOA: 12/12/2022 PCP: Redmond School, MD   Brief HPI:   77 year old male with past medical history of ESRD on hemodialysis Monday Wednesday Friday, HFrEF, diabetes mellitus type 2, hypertension who presented with nausea.  Patient has known history of cholecystitis and underwent cholecystostomy tube placement.  His tube had become dislodged and he underwent evaluation with surgery.  He was noted to have recanalization of the bile duct therefore tube was not replaced.  He came to hospital for elective cholecystectomy underwent preop evaluation with cardiology.  His workup was notable for diffuse T wave inversions and prolonged QTc.  Also was found to be significantly hypokalemic.  He was started on electrolyte replacement.  Nephrology was consulted for chronic hemodialysis during hospitalization.  Despite improvement of Aulik parameters, ECG changes persisted so he was transferred to [facility] for cardiac catheterization on 12/17/2022.    Subjective   Patient seen during hemodialysis.  No new complaints.   Assessment/Plan:     Chronic cholecystitis -Presented with malnutrition, failure to thrive -General surgery was consulted, underwent cholecystectomy on 12/28 with intraoperative cholangiogram  -Per general surgery; pneumoperitoneum is likely from lap chole done last week -No further intervention recommended  Chronic HFrEF, abnormal ECG -Repeat echo during this admission showed EF of 50 to 55% and 35 to 40% in August 2023 -Continue cardiac monitoring -Left heart cath on 12/26 showed multivessel diffuse moderate CAD with no acute or obvious flow-limiting lesions(in absence of any active anginal symptoms, would not PCI in preop setting)  -Medical management recommended, okayed to proceed with surgery as per cardiology -Started on high-dose statin  ESRD -Nephrology consulted for  hemodialysis  Hyperkalemia -Mild, potassium 5.3 -Patient on hemodialysis  Anemia of ESRD -ESA per nephrology -S/p 2 unit PRBC during this hospitalization -Hemoglobin stable at 8.4 this morning  Unstageable sacral ulcer -Foul-smelling decubitus wound noted which progressed as compared to previous imaging from 12/21 -Wound care consulted -Today after hydrotherapy patient developed fever with temperature 101 F -Blood cultures x 2 obtained, wound culture obtained;  -wound culture growing Enterococcus faecalis, Proteus mirabilis and gram-positive cocci -Started on empiric vancomycin and Zosyn -antibiotics were changed  to ampicillin, cefepime -sacral MRI ordered, 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 -Patient has been getting hydrotherapy for past 1 week -Wound care reconsulted today, recommended to stop hydrotherapy as wound has healed -Continue oxycodone 5 mg every 4 hours as needed   Diabetes mellitus type 2 A1c 4.4 (likely reflecting his recent failure to thrive, poor PO intake) D/c SSI   Functional Quadriplegia  Essentially bedbound for the past year (needs help toileting, turning in bed, dressing, etc) -Unclear cause Follow outpatient  Hypophosphatemia -Replete  Hypomagnesemia -Replete  Protein calorie malnutrition -Patient has poor p.o. intake -BMI 22.98 kg/m -Started on core track nighttime feeding -Continue p.o. diet during daytime -core track feeding tube placed for additional nutrition to think subjective help heal sacral ulcer. -As patient's sacral wound is healing well.  Consider stopping core track tube feeding over next 24 hours and assess patient's p.o. intake   Medications     sodium chloride   Intravenous Once   allopurinol  100 mg Oral Daily   aspirin EC  81 mg Oral Daily   atorvastatin  40 mg Oral Daily    Chlorhexidine Gluconate Cloth  6 each Topical Q0600   cycloSPORINE  1 drop Both Eyes BID   darbepoetin (ARANESP) injection - DIALYSIS  200 mcg Subcutaneous Q Fri-1800   feeding supplement  237 mL Oral TID WC   feeding supplement (OSMOLITE 1.5 CAL)  1,000 mL Per Tube Q24H   feeding supplement (PROSource TF20)  60 mL Per Tube BID   Gerhardt's butt cream   Topical BID   heparin  5,000 Units Subcutaneous Q8H   heparin sodium (porcine)       isosorbide dinitrate  5 mg Oral BID   leptospermum manuka honey  1 Application Topical Daily   melatonin  4.5 mg Oral QHS   multivitamin  1 tablet Oral QHS   nutrition supplement (JUVEN)  1 packet Per Tube BID BM   phosphorus  250 mg Oral BID   polyethylene glycol  17 g Oral Daily   senna-docusate  1 tablet Oral BID   sodium chloride flush  3 mL Intravenous Q12H     Data Reviewed:   CBG:  Recent Labs  Lab 12/31/22 1214 12/31/22 1546 12/31/22 2209 01/01/23 0428 01/01/23 0756  GLUCAP 198* 126* 158* 175* 165*    SpO2: 100 % O2 Flow Rate (L/min): 3 L/min    Vitals:   01/01/23 1209 01/01/23 1230 01/01/23 1325 01/01/23 1330  BP:  112/64 (!) 103/57   Pulse: 88  85   Resp: 16 16 (!) 21   Temp:   (!) 97.5 F (36.4 C)   TempSrc:   Oral   SpO2: 100%  100% 100%  Weight:      Height:          Data Reviewed:  Basic Metabolic Panel: Recent Labs  Lab 12/28/22 0218 12/28/22 0219 12/29/22 0132 12/30/22 0156 12/31/22 0617 01/01/23 0130  NA 135  --  137 140 133* 136  K 3.6  --  4.0 4.4 4.4 5.3*  CL 99  --  96* 100 96* 98  CO2 29  --  '28 27 28 27  '$ GLUCOSE 201*  --  200* 171* 181* 159*  BUN 32*  --  67* 88* 62* 93*  CREATININE 2.59*  --  3.89* 4.98* 3.18* 4.01*  CALCIUM 9.0  --  9.4 9.5 9.1 9.4  MG  --  1.6* 2.1 2.0  --   --   PHOS 1.5* 1.5* 1.6* 3.1  3.1 2.1* 2.3*    CBC: Recent Labs  Lab 12/27/22 0336 12/30/22 1614 12/31/22 0617 01/01/23 0130  WBC 10.4 12.6* 11.1* 11.6*  HGB 8.8* 6.3* 8.4* 7.9*  HCT 28.1* 20.4* 26.1*  24.0*  MCV 101.8* 102.0* 97.0 97.6  PLT 329 371 358 364    LFT Recent Labs  Lab 12/28/22 0218 12/29/22 0132 12/30/22 0156 12/31/22 0617 01/01/23 0130  ALBUMIN 2.2* 2.1* 2.1* 1.9* 1.9*     Antibiotics: Anti-infectives (From admission, onward)    Start     Dose/Rate Route Frequency Ordered Stop   12/30/22 2200  ampicillin (OMNIPEN) 2 g in sodium chloride 0.9 % 100 mL IVPB        2 g 300 mL/hr over 20 Minutes Intravenous Every 12 hours 12/30/22 1519 01/06/23 0959   12/30/22 1145  linezolid (ZYVOX) IVPB 600 mg  Status:  Discontinued        600 mg 300 mL/hr over 60 Minutes Intravenous Every 12 hours 12/30/22 1056 12/30/22 1519   12/28/22 0000  vancomycin (VANCOCIN) IVPB 1000 mg/200 mL premix  Status:  Discontinued  1,000 mg 200 mL/hr over 60 Minutes Intravenous Every M-W-F (Hemodialysis) 12/27/22 1354 12/30/22 1056   12/27/22 1445  vancomycin (VANCOCIN) IVPB 1000 mg/200 mL premix        1,000 mg 200 mL/hr over 60 Minutes Intravenous  Once 12/27/22 1354 12/27/22 1738   12/27/22 1200  vancomycin (VANCOCIN) IVPB 1000 mg/200 mL premix  Status:  Discontinued        1,000 mg 200 mL/hr over 60 Minutes Intravenous Every M-W-F (Hemodialysis) 12/25/22 1831 12/27/22 1354   12/26/22 2100  ceFEPIme (MAXIPIME) 1 g in sodium chloride 0.9 % 100 mL IVPB        1 g 200 mL/hr over 30 Minutes Intravenous Every 24 hours 12/26/22 1052 12/31/22 2034   12/25/22 1900  ceFEPIme (MAXIPIME) 1 g in sodium chloride 0.9 % 100 mL IVPB  Status:  Discontinued        1 g 200 mL/hr over 30 Minutes Intravenous Every 24 hours 12/25/22 1812 12/26/22 1052   12/25/22 1900  vancomycin (VANCOREADY) IVPB 1750 mg/350 mL        1,750 mg 175 mL/hr over 120 Minutes Intravenous  Once 12/25/22 1812 12/26/22 0146   12/25/22 1900  metroNIDAZOLE (FLAGYL) IVPB 500 mg        500 mg 100 mL/hr over 60 Minutes Intravenous Every 12 hours 12/25/22 1812 01/01/23 0700   12/13/22 0600  cefoTEtan (CEFOTAN) 2 g in sodium chloride  0.9 % 100 mL IVPB  Status:  Discontinued        2 g 200 mL/hr over 30 Minutes Intravenous On call to O.R. 12/12/22 1631 12/14/22 0559   12/12/22 0615  cefoTEtan (CEFOTAN) 2 g in sodium chloride 0.9 % 100 mL IVPB  Status:  Discontinued        2 g 200 mL/hr over 30 Minutes Intravenous On call to O.R. 12/12/22 7893 12/12/22 0913        DVT prophylaxis: Heparin  Code Status: Full code  Family Communication: No family at bedside   CONSULTS General surgery   Objective    Physical Examination:   General-appears in no acute distress Heart-S1-S2, regular, no murmur auscultated Lungs-clear to auscultation bilaterally, no wheezing or crackles auscultated Abdomen-soft, nontender, no organomegaly Extremities-no edema in the lower extremities     Status is: Inpatient:      Pressure Injury 11/06/22 Sacrum Mid Stage 4 - Full thickness tissue loss with exposed bone, tendon or muscle. (Active)  11/06/22 1315  Location: Sacrum  Location Orientation: Mid  Staging: Stage 4 - Full thickness tissue loss with exposed bone, tendon or muscle.  Wound Description (Comments):   Present on Admission: Yes        [place]   [facility] If 7PM-7AM, please contact night-coverage at [email or web address], Office  [phone]   01/01/2023, 1:58 PM  LOS: 19 days

## 2023-01-01 NOTE — Progress Notes (Signed)
   01/01/23 1357  Pain Assessment  Pain Scale 0-10  Pain Score 0  Hemodialysis Catheter Right Subclavian  Placement Date: 08/06/22   Placed prior to admission: Yes  Orientation: Right  Access Location: Subclavian  Site Condition No complications  Neurological  Level of Consciousness Alert  Orientation Level Oriented to place;Disoriented to time;Disoriented to situation  Respiratory  Respiratory Pattern Regular;Unlabored  Chest Assessment Chest expansion symmetrical  Bilateral Breath Sounds Clear;Diminished  Cardiac  Pulse Irregular  Cardiac Rhythm Junctional rhythm   Received patient in bed to unit.  Alert and oriented.  Informed consent signed and in chart.   Treatment initiated: 0820 Treatment completed: 1328  Patient tolerated well.  Transported back to the room  Alert, without acute distress.  Hand-off given to patient's nurse.   Access used: Yes Access issues: HD catheter clotted off, set-up a new machine and add extra Heparin 4000  Total UF removed: 1300 Medication(s) given: Heparin 8000 units and Heplock Heparin1.9 cc both ports Post HD VS: 97.5,85, 107/65, 17, Spo2 100 R/A Post HD weight: None   [facility]

## 2023-01-01 NOTE — Progress Notes (Addendum)
OT Cancellation Note  Patient Details Name: [name] MRN: [id number] DOB: 03-21-1946   Cancelled Treatment:    Reason Eval/Treat Not Completed: Patient at procedure or test/ unavailable (pt off unit at HD, will follow up for OT tx as schedule permits)  4451: Pt still in HD  [name], OTD, OTR/L SecureChat [facility] [phone]   [name] 01/01/2023, 8:23 AM

## 2023-01-01 NOTE — Progress Notes (Signed)
Per RN patient had "staring" spell. He had his HD treatment today.  He is currently Alert and Oriented x 2 (per staff this is his recent baseline)  He will answer questions and follow commands.  No new focal deficit noted.  Per notes he has been bed bound for a year. He does complain of some abdominal discomfort.  BP 98/53  HR 94  RR 17  O2 sat 100% CBG 100  Recommended notifying MD of event and patient's abdominal discomfort.

## 2023-01-01 NOTE — Progress Notes (Signed)
Pt continued to be A/Ox2 (oriented to person and place) but started to space out when being talked to. He continued to moan and groan and intermittently speak clear words. Rapid response was called. Pt complaining of abdominal pain. Rapid stated that VS looked appropriate and to just update MD on the abdominal pain. MD made aware- RN advised to keep MD posted. No new orders. Will continue to monitor.

## 2023-01-01 NOTE — Progress Notes (Signed)
Subjective:  seen in HD , no co, needing extra heparan on hd 2/2 clotting  system   Objective Vital signs in last 24 hours: Vitals:   01/01/23 1209 01/01/23 1230 01/01/23 1325 01/01/23 1330  BP:  112/64 (!) 103/57   Pulse: 88  85   Resp: 16 16 (!) 21   Temp:   (!) 97.5 F (36.4 C)   TempSrc:   Oral   SpO2: 100%  100% 100%  Weight:      Height:       Weight change: 1.497 kg  Physical Exam: General: Chronically ill adult male, NAD  Heart: RRR no MRG Lungs: CTA bilaterally nonlabored breathing Abdomen: NABS soft NTND, NG tube present Extremities: No lower extremity edema  dialysis Access: Right IJ TDC patent on hd    Dialysis Orders: [facility] MWF 4h  400/800  84.5kg  2/2.5 bath   Heparin 4000 units IV  RIJ TDC - last OP HD 12/20, post wt 85.9kg - last IP hD 12/24, post wt 85kg - mircera 225 mcg IV q2, last 12/18, due 1/02 - no vdra     Problem/Plan:   ESRD - on HD MWF. on schedule  /incre. heparin dose 4000 units pre and  mid tx 3000, k 5.3 , bun 93  needs 4hr tx  ( if available staff) with Bun ^  93  Chronic cholecystitis - w/ nausea, ( now resolved )  malnutrition, FTT.  S/p laparoscopic cholecystectomy 12/19/2022 Sacral decubitus ulcer =On IV antibiotics per primary undergoing hydrotherapy, afebrile today, empiric Vanco and Zosyn per admit team sacral MRI "not done as patient has SNS "per admit team, admit team consulted surgery no surgical intervention currently needed/ noted notes now indicating some healing / was getting  NGT feeding  for nutrition to help healing  Chronic HFrEF - LVEF up to 50-55% here, diffuse CAD.  HTN/ volume -BP stable. No volume exces UF as tolerated. 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 Tsat low but infection picture and ferritin high. 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 albumin low 1.9 PCM,  FTT. On regular diet + protein supp. Coretrak placed -for nutrition nutrition per admit plus per RD following Medication -on admit was on gabapentin dose inappropriate for ESRD pt. -Unclear indication -will d/c for now. Can resume at 300 mg q day if needed.  Denying neuropathy pain this a.m.   [name] Haber, PA-C [facility] [phone] 01/01/2023,2:34 PM  LOS: 19 days   Labs: Basic Metabolic Panel: Recent Labs  Lab 12/30/22 0156 12/31/22 0617 01/01/23 0130  NA 140 133* 136  K 4.4 4.4 5.3*  CL 100 96* 98  CO2 '27 28 27  '$ GLUCOSE 171* 181* 159*  BUN 88* 62* 93*  CREATININE 4.98* 3.18* 4.01*  CALCIUM 9.5 9.1 9.4  PHOS 3.1  3.1 2.1* 2.3*   Liver Function Tests: Recent Labs  Lab 12/30/22 0156 12/31/22 0617 01/01/23 0130  ALBUMIN 2.1* 1.9* 1.9*   No results for input(s): "LIPASE", "AMYLASE" in the last 168 hours. No results for input(s): "AMMONIA" in the last 168 hours. CBC: Recent Labs  Lab 12/27/22 0336 12/30/22 1614 12/31/22 0617 01/01/23 0130  WBC 10.4 12.6* 11.1* 11.6*  HGB 8.8* 6.3* 8.4* 7.9*  HCT 28.1* 20.4* 26.1* 24.0*  MCV 101.8* 102.0* 97.0 97.6  PLT 329 371 358 364   Cardiac Enzymes: No results for input(s): "CKTOTAL", "CKMB", "CKMBINDEX", "TROPONINI" in the last 168 hours. CBG: Recent Labs  Lab 12/31/22 1214 12/31/22 1546 12/31/22 2209 01/01/23 0428 01/01/23 0756  GLUCAP 198* 126* 158* 175* 165*    Studies/Results: No results found. Medications:  ampicillin (OMNIPEN) IV Stopped (12/31/22 2210)    sodium chloride   Intravenous Once   allopurinol  100 mg Oral Daily   aspirin EC  81 mg Oral Daily   atorvastatin  40 mg Oral Daily   Chlorhexidine Gluconate Cloth  6 each Topical Q0600   cycloSPORINE  1 drop Both Eyes BID   darbepoetin (ARANESP) injection - DIALYSIS  200 mcg Subcutaneous Q Fri-1800   feeding supplement  237 mL Oral TID WC   feeding supplement (OSMOLITE 1.5 CAL)  1,000 mL Per Tube Q24H   feeding supplement  (PROSource TF20)  60 mL Per Tube BID   Gerhardt's butt cream   Topical BID   heparin  5,000 Units Subcutaneous Q8H   heparin sodium (porcine)       isosorbide dinitrate  5 mg Oral BID   leptospermum manuka honey  1 Application Topical Daily   melatonin  4.5 mg Oral QHS   multivitamin  1 tablet Oral QHS   nutrition supplement (JUVEN)  1 packet Per Tube BID BM   phosphorus  250 mg Oral BID   polyethylene glycol  17 g Oral Daily   senna-docusate  1 tablet Oral BID   sodium chloride flush  3 mL Intravenous Q12H

## 2023-01-01 NOTE — TOC Progression Note (Signed)
Transition of Care [facility]) - Progression Note    Patient Details  Name: [name] MRN: [id number] Date of Birth: 1946/06/16  Transition of Care [facility]) CM/SW [place], [place] Phone Number: 01/01/2023, 2:09 PM  Clinical Narrative:     Patient has SNF bed at [facility] . CSW following to start insurance authorization close to patient being medically ready for dc. CSW will continue to follow and assist with patients dc planning needs    Expected Discharge Plan: [place] Barriers to Discharge: Continued Medical Work up  Expected Discharge Plan and Services In-house Referral: Clinical Social Work   Post Acute Care Choice: [place] Living arrangements for the past 2 months: [place] Determinants of Health (SDOH) Interventions [place]: No Food Insecurity (09/13/2022)  Housing: Low Risk  (12/17/2022)  Transportation Needs: No Transportation Needs (12/17/2022)  Utilities: Not At Risk (12/17/2022)  Tobacco Use: Medium Risk (12/20/2022)    Readmission Risk Interventions    09/16/2022   12:31 PM 08/02/2022    2:23 PM  Readmission Risk Prevention Plan  Transportation Screening Complete Complete  Medication Review ([place]) Complete Complete  HRI or Home Care Consult Complete Complete  SW Recovery Care/Counseling Consult Complete Complete  Palliative Care Screening Not Applicable Not Applicable  Skilled Nursing Facility Not Applicable Complete

## 2023-01-02 DIAGNOSIS — K811 Chronic cholecystitis: Secondary | ICD-10-CM | POA: Diagnosis not present

## 2023-01-02 DIAGNOSIS — R531 Weakness: Secondary | ICD-10-CM

## 2023-01-02 DIAGNOSIS — N186 End stage renal disease: Secondary | ICD-10-CM | POA: Diagnosis not present

## 2023-01-02 DIAGNOSIS — D649 Anemia, unspecified: Secondary | ICD-10-CM | POA: Diagnosis not present

## 2023-01-02 DIAGNOSIS — E1169 Type 2 diabetes mellitus with other specified complication: Secondary | ICD-10-CM | POA: Diagnosis not present

## 2023-01-02 LAB — RENAL FUNCTION PANEL
Albumin: 1.9 g/dL — ABNORMAL LOW (ref 3.5–5.0)
Anion gap: 10 (ref 5–15)
BUN: 59 mg/dL — ABNORMAL HIGH (ref 8–23)
CO2: 27 mmol/L (ref 22–32)
Calcium: 9.1 mg/dL (ref 8.9–10.3)
Chloride: 97 mmol/L — ABNORMAL LOW (ref 98–111)
Creatinine, Ser: 2.47 mg/dL — ABNORMAL HIGH (ref 0.61–1.24)
GFR, Estimated: 26 mL/min — ABNORMAL LOW (ref >60)
Glucose, Bld: 149 mg/dL — ABNORMAL HIGH (ref 70–99)
Phosphorus: 2.8 mg/dL (ref 2.5–4.6)
Potassium: 4.6 mmol/L (ref 3.5–5.1)
Sodium: 134 mmol/L — ABNORMAL LOW (ref 135–145)

## 2023-01-02 LAB — GLUCOSE, CAPILLARY
Glucose-Capillary: 114 mg/dL — ABNORMAL HIGH (ref 70–99)
Glucose-Capillary: 131 mg/dL — ABNORMAL HIGH (ref 70–99)
Glucose-Capillary: 133 mg/dL — ABNORMAL HIGH (ref 70–99)
Glucose-Capillary: 136 mg/dL — ABNORMAL HIGH (ref 70–99)
Glucose-Capillary: 154 mg/dL — ABNORMAL HIGH (ref 70–99)
Glucose-Capillary: 94 mg/dL (ref 70–99)

## 2023-01-02 MED ORDER — PROSOURCE PLUS PO LIQD
30.0000 mL | Freq: Two times a day (BID) | ORAL | Status: DC
  Administered 2023-01-03 – 2023-01-21 (×32): 30 mL via ORAL
  Filled 2023-01-02 (×35): qty 30

## 2023-01-02 NOTE — Progress Notes (Signed)
Physical Therapy Treatment and Discharge Patient Details Name: [name] MRN: [id number] DOB: 1946/04/22 Today's Date: 01/02/2023   History of Present Illness 77 yo male admitted to [facility] 12/21 for cholecystectomy which was canceled due to hypokalemia and prolonged Qtc. Transfer to [facility] 12/26 with cardiac cath performed same date. 12/28 lap chole. PMHx: ESRD on HD MWF, HFrEF, DM, HTN, anxiety, HLD, Bil TKA, gout, GERD    PT Comments    Pt agreeable to getting up on entry, however pt once mobilization initiated pt with increased grimace and painful moaning. Pt with limited command follow to roll onto side and ultimately requires total Ax2 for rolling from R side onto L side to decrease pressure on sacral wound. This represents a decrease in functional mobility limited by sacral wound pain and decreased command follow and participation in therapy. Given pt decreased tolerance to movement, will discontinue PT services and recommend palliative consult to work on [place]. Please reorder PT services if pt ability to participate in therapy improves.     Recommendations for follow up therapy are one component of a multi-disciplinary discharge planning process, led by the attending physician.  Recommendations may be updated based on patient status, additional functional criteria and insurance authorization.  Follow Up Recommendations  Skilled nursing-short term rehab (<3 hours/day) Can patient physically be transported by private vehicle: No   Assistance Recommended at Discharge Frequent or constant Supervision/Assistance  Patient can return home with the following Two people to help with walking and/or transfers;Two people to help with bathing/dressing/bathroom;Assistance with cooking/housework;Assistance with feeding;Assist for transportation;Direct supervision/assist for medications management;Direct supervision/assist for financial management   Equipment Recommendations  Hospital bed;Wheelchair  (measurements PT);Wheelchair cushion (measurements PT);Other (comment) (hoyer lift, air matress)    Recommendations for Other Services  Palliative Consult     Precautions / Restrictions Precautions Precautions: Fall;Other (comment) Precaution Comments: sacral wound, cortrak (nocturnal feedings) Restrictions Weight Bearing Restrictions: No     Mobility  Bed Mobility Overal bed mobility: Needs Assistance Bed Mobility: Rolling Rolling: Total assist, +2 for physical assistance, +2 for safety/equipment         General bed mobility comments: Total A x 2 for rolling side to side in bed for repositioning efforts, eventually in sidelying to decrease pain/pressure on sacral wound    Transfers                   General transfer comment: unable          Cognition Arousal/Alertness: Awake/alert, Lethargic Behavior During Therapy: Flat affect Overall Cognitive Status: Impaired/Different from baseline Area of Impairment: Safety/judgement, Problem solving, Awareness, Attention, Following commands, Memory                   Current Attention Level: Focused Memory: Decreased short-term memory Following Commands: Follows one step commands inconsistently, Follows one step commands with increased time Safety/Judgement: Decreased awareness of deficits, Decreased awareness of safety Awareness: Intellectual Problem Solving: Slow processing, Difficulty sequencing, Decreased initiation, Requires verbal cues, Requires tactile cues General Comments: able to follow basic commands for simple ADL tasks, inconsistent reponses to pain/sensation inquiries. crying/moaning throughout with any attempts for bed mobility and repositioning though could not vocalize consistently needs for comfort           General Comments General comments (skin integrity, edema, etc.): Discussed with nursing with minimal PO intake, dietician discussing this with MD. Pain limiting therapy efforts       Pertinent Vitals/Pain Pain Assessment Pain Assessment: Faces Faces Pain Scale: Hurts  worst Pain Location: BLE, sacrum Pain Descriptors / Indicators: Moaning, Discomfort, Grimacing, Guarding, Crying Pain Intervention(s): Monitored during session, Limited activity within patient's tolerance, Repositioned     PT Goals (current goals can now be found in the care plan section) Acute Rehab PT Goals PT Goal Formulation: With patient Time For Goal Achievement: 01/06/23 Potential to Achieve Goals: Fair Progress towards PT goals: Not progressing toward goals - comment (limited by sacral wound pain)    Frequency    Min 2X/week      PT Plan Current plan remains appropriate    Co-evaluation PT/OT/SLP Co-Evaluation/Treatment: Yes Reason for Co-Treatment: To address functional/ADL transfers PT goals addressed during session: Mobility/safety with mobility OT goals addressed during session: Strengthening/ROM      AM-PAC PT "6 Clicks" Mobility   Outcome Measure  Help needed turning from your back to your side while in a flat bed without using bedrails?: Total Help needed moving from lying on your back to sitting on the side of a flat bed without using bedrails?: Total Help needed moving to and from a bed to a chair (including a wheelchair)?: Total Help needed standing up from a chair using your arms (e.g., wheelchair or bedside chair)?: Total Help needed to walk in hospital room?: Total Help needed climbing 3-5 steps with a railing? : Total 6 Click Score: 6    End of Session   Activity Tolerance: Patient limited by pain Patient left: in bed;with call bell/phone within reach;with bed alarm set Nurse Communication: Patient requests pain meds PT Visit Diagnosis: Unsteadiness on feet (R26.81);Other abnormalities of gait and mobility (R26.89);Muscle weakness (generalized) (M62.81);Difficulty in walking, not elsewhere classified (R26.2)     Time: 5093-2671 PT Time Calculation (min)  (ACUTE ONLY): 29 min  Charges:  $Therapeutic Activity: 8-22 mins                     Jillianna Stanek B. Migdalia Dk PT, DPT Acute Rehabilitation Services Please use secure chat or  Call Office [phone]    [place] 01/02/2023, 10:12 AM

## 2023-01-02 NOTE — Progress Notes (Signed)
Occupational Therapy Treatment/Discharge Patient Details Name: [name] MRN: [id number] DOB: 29-Oct-1946 Today's Date: 01/02/2023   History of present illness 77 yo male admitted to [facility] 12/21 for cholecystectomy which was canceled due to hypokalemia and prolonged Qtc. Transfer to [facility] 12/26 with cardiac cath performed same date. 12/28 lap chole. PMHx: ESRD on HD MWF, HFrEF, DM, HTN, anxiety, HLD, Bil TKA, gout, GERD   OT comments  Pt with considerable functional decline since initial OT eval. Pt continues to require Total A x 2 for rolling in bed and ultimately unable to attempt EOB due to significant pain with any movement. Pt able to complete basic ADL tasks (washing face) bed level with cues but requires Total A for bathing, dressing, toileting bed level. Due to significant pain with any therapeutic attempts, noted decreased PO intake and limitations due to cognition - recommend palliative consult to determine [place]. Due to barriers, will sign off for therapy at acute level. Continue to rec DC to facility for nursing/wound care. If pain more manageable or pt showing signs of ability to participate further, please reconsult PT/OT.    Recommendations for follow up therapy are one component of a multi-disciplinary discharge planning process, led by the attending physician.  Recommendations may be updated based on patient status, additional functional criteria and insurance authorization.    Follow Up Recommendations  Skilled nursing-short term rehab (<3 hours/day)     Assistance Recommended at Discharge Frequent or constant Supervision/Assistance  Patient can return home with the following  Two people to help with walking and/or transfers;Two people to help with bathing/dressing/bathroom   Equipment Recommendations  Hospital bed;Other (comment) (hoyer lift)    Recommendations for Other Services      Precautions / Restrictions Precautions Precautions: Fall;Other (comment) Precaution  Comments: sacral wound, cortrak (nocturnal feedings) Restrictions Weight Bearing Restrictions: No       Mobility Bed Mobility Overal bed mobility: Needs Assistance Bed Mobility: Rolling Rolling: Total assist, +2 for physical assistance, +2 for safety/equipment         General bed mobility comments: Total A x 2 for rolling side to side in bed for repositioning efforts, eventually in sidelying to decrease pain/pressure on sacral wound    Transfers                   General transfer comment: unable     Balance                                           ADL either performed or assessed with clinical judgement   ADL Overall ADL's : Needs assistance/impaired     Grooming: Minimal assistance;Bed level;Wash/dry face Grooming Details (indicate cue type and reason): assist with washcloth into R hand with pt able to bring to face, distracted and dropped washcloth when discomfort felt             Lower Body Dressing: Total assistance;Bed level                 General ADL Comments: Max-Total A for bathing, dressing and toileting bed level based on current presentation. significant pain with any movement.    Extremity/Trunk Assessment Upper Extremity Assessment Upper Extremity Assessment: Generalized weakness RUE Deficits / Details: arthritic changes in B hands, able to hold bedrail, wash cloth and bring hand to face   Lower Extremity Assessment Lower Extremity Assessment: Defer to  PT evaluation        Vision   Vision Assessment?: No apparent visual deficits   Perception     Praxis      Cognition Arousal/Alertness: Awake/alert, Lethargic Behavior During Therapy: Flat affect Overall Cognitive Status: Impaired/Different from baseline Area of Impairment: Safety/judgement, Problem solving, Awareness, Attention, Following commands, Memory                   Current Attention Level: Focused Memory: Decreased short-term  memory Following Commands: Follows one step commands inconsistently, Follows one step commands with increased time Safety/Judgement: Decreased awareness of deficits, Decreased awareness of safety Awareness: Intellectual Problem Solving: Slow processing, Difficulty sequencing, Decreased initiation, Requires verbal cues, Requires tactile cues General Comments: able to follow basic commands for simple ADL tasks, inconsistent reponses to pain/sensation inquiries. crying/moaning throughout with any attempts for bed mobility and repositioning though could not vocalize consistently needs for comfort        Exercises      Shoulder Instructions       General Comments Discussed with nursing with minimal PO intake, dietician discussing this with MD. Pain limiting therapy efforts    Pertinent Vitals/ Pain       Pain Assessment Pain Assessment: Faces Faces Pain Scale: Hurts worst Pain Location: BLE, sacrum Pain Descriptors / Indicators: Moaning, Discomfort, Grimacing, Guarding, Crying Pain Intervention(s): Monitored during session, Limited activity within patient's tolerance, Repositioned  Home Living                                          Prior Functioning/Environment              Frequency  Min 1X/week        Progress Toward Goals  OT Goals(current goals can now be found in the care plan section)  Progress towards OT goals: Not progressing toward goals - comment;Goals drowngraded-see care plan (DC therapy)  Acute Rehab OT Goals Patient Stated Goal: unable to state OT Goal Formulation: With patient Time For Goal Achievement: 12/27/22  Plan Discharge plan remains appropriate;Other (comment) (Goals not met, significant decline and unable to participate with therapy d/t pain)    Co-evaluation    PT/OT/SLP Co-Evaluation/Treatment: Yes Reason for Co-Treatment: To address functional/ADL transfers PT goals addressed during session: Mobility/safety with  mobility OT goals addressed during session: Strengthening/ROM      AM-PAC OT "6 Clicks" Daily Activity     Outcome Measure   Help from another person eating meals?: A Lot Help from another person taking care of personal grooming?: A Little Help from another person toileting, which includes using toliet, bedpan, or urinal?: Total Help from another person bathing (including washing, rinsing, drying)?: Total Help from another person to put on and taking off regular upper body clothing?: Total Help from another person to put on and taking off regular lower body clothing?: Total 6 Click Score: 9    End of Session    OT Visit Diagnosis: Unsteadiness on feet (R26.81);Muscle weakness (generalized) (M62.81)   Activity Tolerance Patient limited by pain   Patient Left in bed;with call bell/phone within reach   Nurse Communication Mobility status        Time: 0821-0850 OT Time Calculation (min): 29 min  Charges: OT General Charges $OT Visit: 1 Visit OT Treatments $Therapeutic Activity: 8-22 mins  Malachy Chamber, OTR/L Acute Rehab Services Office: [phone]   Layla Maw 01/02/2023,  9:58 AM

## 2023-01-02 NOTE — Progress Notes (Signed)
Subjective: Seen in room complaining of back discomfort wanting to be repositioned, staff in room to help.  Said tolerated dialysis without trouble yesterday  Objective Vital signs in last 24 hours: Vitals:   01/01/23 2000 01/02/23 0001 01/02/23 0330 01/02/23 0809  BP: 103/64 103/63 96/61 (!) 121/54  Pulse: 87 88 83 78  Resp: '13 14 18 18  '$ Temp:  98.4 F (36.9 C) 98 F (36.7 C) 98.4 F (36.9 C)  TempSrc:  Oral Oral Axillary  SpO2: 100% 100% 100% 100%  Weight:      Height:       Weight change:   Physical Exam: General: Chronically ill adult male, NAD  Heart: RRR no MRG Lungs: CTA bilaterally nonlabored breathing Abdomen: NABS soft NTND, NG tube present Extremities: No lower extremity edema  dialysis Access: Right IJ TDC    Dialysis Orders: [facility] MWF 4h  400/800  84.5kg  2/2.5 bath   Heparin 4000 units IV  RIJ TDC - last OP HD 12/20, post wt 85.9kg - last IP hD 12/24, post wt 85kg - mircera 225 mcg IV q2, last 12/18, due 1/02 - no vdra     Problem/Plan:   ESRD - on HD MWF. on schedule  /incre. heparin dose 4000 units pre and  mid tx 3000, k 5.3 , bun 93  needs 4hr tx  ( if available staff) with Bun ^  93 improved to 45 today after treatment yesterday K4.6 Chronic cholecystitis - w/ nausea, ( now resolved )  malnutrition, FTT.  S/p laparoscopic cholecystectomy 12/19/2022 Sacral decubitus ulcer =On IV antibiotics per primary Merdis Delay undergoing hydrotherapy, empiric Vanco and Zosyn per admit team sacral MRI "not done as patient has SNS "per admit team, admit team consulted surgery no surgical intervention currently needed/ noted notes now indicating some healing / was getting  NGT feeding  for nutrition to help healing  Chronic HFrEF - LVEF up to 50-55% here, diffuse CAD.  HTN/ volume -BP stable. No volume exces UF as tolerated. 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 Tsat low but infection picture and ferritin high. 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 albumin low 1.9 PCM, FTT. On regular diet + protein supp. Coretrak placed -for nutrition nutrition per admit plus per RD following Medication -on admit was on gabapentin dose inappropriate for ESRD pt. -Unclear indication -will d/c for now. Can resume at 300 mg q day if needed.  Denying neuropathy pain this a.m.  Ernest Haber, PA-C [facility] [phone] 01/02/2023,11:49 AM  LOS: 20 days   Labs: Basic Metabolic Panel: Recent Labs  Lab 12/31/22 0617 01/01/23 0130 01/02/23 0131  NA 133* 136 134*  K 4.4 5.3* 4.6  CL 96* 98 97*  CO2 '28 27 27  '$ GLUCOSE 181* 159* 149*  BUN 62* 93* 59*  CREATININE 3.18* 4.01* 2.47*  CALCIUM 9.1 9.4 9.1  PHOS 2.1* 2.3* 2.8   Liver Function Tests: Recent Labs  Lab 12/31/22 0617 01/01/23 0130 01/02/23 0131  ALBUMIN 1.9* 1.9* 1.9*   No results for input(s): "LIPASE", "AMYLASE" in the last 168 hours. No results for input(s): "AMMONIA" in the last 168 hours. CBC: Recent Labs  Lab 12/27/22 0336 12/30/22 1614 12/31/22 0617 01/01/23 0130  WBC 10.4 12.6* 11.1*  11.6*  HGB 8.8* 6.3* 8.4* 7.9*  HCT 28.1* 20.4* 26.1* 24.0*  MCV 101.8* 102.0* 97.0 97.6  PLT 329 371 358 364   Cardiac Enzymes: No results for input(s): "CKTOTAL", "CKMB", "CKMBINDEX", "TROPONINI" in the last 168 hours. CBG: Recent Labs  Lab 01/01/23 1709 01/01/23 2004 01/02/23 0028 01/02/23 0425 01/02/23 0753  GLUCAP 130* 128* 154* 131* 136*    Studies/Results: No results found. Medications:  ampicillin (OMNIPEN) IV 2 g (01/02/23 0340)    sodium chloride   Intravenous Once   allopurinol  100 mg Oral Daily   aspirin EC  81 mg Oral Daily   atorvastatin  40 mg Oral Daily   Chlorhexidine Gluconate Cloth  6 each Topical Q0600   cycloSPORINE  1 drop Both Eyes BID   darbepoetin (ARANESP) injection  - DIALYSIS  200 mcg Subcutaneous Q Fri-1800   feeding supplement  237 mL Oral TID WC   feeding supplement (OSMOLITE 1.5 CAL)  1,000 mL Per Tube Q24H   feeding supplement (PROSource TF20)  60 mL Per Tube BID   Gerhardt's butt cream   Topical BID   heparin  5,000 Units Subcutaneous Q8H   isosorbide dinitrate  5 mg Oral BID   leptospermum manuka honey  1 Application Topical Daily   melatonin  4.5 mg Oral QHS   multivitamin  1 tablet Oral QHS   nutrition supplement (JUVEN)  1 packet Per Tube BID BM   polyethylene glycol  17 g Oral Daily   senna-docusate  1 tablet Oral BID   sodium chloride flush  3 mL Intravenous Q12H

## 2023-01-02 NOTE — Progress Notes (Signed)
Nutrition Follow-up  DOCUMENTATION CODES:   Non-severe (moderate) malnutrition in context of chronic illness  INTERVENTION:  Per goals of care discussion with MD, Cortrak tube was removed today as pt wants to try to improve oral intake without tube in place. Plan is to encourage PO intake and discuss goals of care with pt/family regarding long-term nutrition support (PEG tube) if PO intake remains poor.  Will discontinue tube feed orders as Cortrak tube has been removed. Will also discontinue Juven that was being provided per tube as pt would benefit more from Ensure Enlive supplement po with poor PO intake to help meet kcal/protein needs.  Continue liberalized, regular diet. Provide 1:1 assistance at meals.  Continue Ensure Enlive po TID, each supplement provides 350 kcal and 20 grams of protein.  Provide PROSource Plus 30 mL po BID, each supplement provides 100 kcal and 15 grams of protein.  Continue Rena-vite po QHS.  NUTRITION DIAGNOSIS:   Moderate Malnutrition related to chronic illness (ESRD on HD, chronic cholecystitis) as evidenced by moderate fat depletion, moderate muscle depletion, percent weight loss.  Ongoing.  GOAL:   Patient will meet greater than or equal to 90% of their needs  Previously met with assistance from nocturnal tube feeds.  MONITOR:   PO intake, Supplement acceptance, Labs, Weight trends, TF tolerance, I & O's, Skin  REASON FOR ASSESSMENT:   Malnutrition Screening Tool    ASSESSMENT:   77 year old male with PMHx of ESRD on HD M/W/F, HFrEF, DM type 2, HTN, chronic cholecystitis s/p cholecystostomy tube placement that was later dislodged who presented with nausea. Pt underwent cholecystectomy on 12/28 with intraoperative cholangiogram. Pt also with unstageable sacral ulcer that progressed as compared to previous imaging 12/21; no debridement recommended by general surgery.  12/28: s/p laparoscopic cholecystectomy with intraoperative ICG  cholangiography   1/5: Cortrak tube placed and nocturnal tube feeds initiated 1/11: Cortrak tube removed  Pt had HD yesterday and 1300 mL was removed.  Met with pt at bedside this morning. He reported his appetite remained decreased. No meal documentation available in chart from yesterday, but pt reports he only ate bites at meals. RN also reports he heard in report pt did not eat well yesterday. It is not documented that pt received any Ensure yesterday. On 1/9 pt had 5% of breakfast and 0% of lunch and dinner.   As pt unable to discharge with Cortrak tube in place, discussed plan of care with MD via secure chat. Plan was for goals of care discussion. MD discussed with pt and pt wants to try oral diet so order was placed for Cortrak tube to be removed. Plan is to encourage oral intake. If pt's PO intake is inadequate, plan is for discussion with pt/family regarding PEG tube. Palliative Medicine was consulted.  Pt was 98.1 kg on 08/23/22. Pt was 83.4 kg on 12/25/22. He lost 14.7 kg or 15% wt over 4 months, which is significant for time frame. Most recent wt is 89.5 kg, which may be falsely elevated. Will continue to monitor trends.   Enteral Access: Cortrak tube now removed  UOP: none documented  I/O: +4076.5 mL since 12/19/22  Medications reviewed and include: allopurinol, Ensure Enlive TID, Rena-vite po QHS, Juven BID per tube, Miralax, senna-docusate, ampicillin  Labs reviewed: CBG 94-136, Sodium 134, Chloride 97, BUN 59, Creatinine 2.47  Diet Order:   Diet Order             Diet regular Room service appropriate? Yes; Fluid consistency:  Thin  Diet effective now                  EDUCATION NEEDS:   Not appropriate for education at this time  Skin:  Skin Assessment: Skin Integrity Issues: Skin Integrity Issues:: Stage IV, Incisions Stage IV: initially unstageable now evolved to stage 4 to sacrum (4 cm x 3 cm x 1.5 cm); wound has improved and no longer requires hydrotherapy or  [place] Unstageable: N/A Incisions: closed incision to abdomen  Last BM:  01/01/23 - medium type 5  Height:   Ht Readings from Last 1 Encounters:  12/17/22 '6\' 3"'$  (1.905 m)   Weight:   Wt Readings from Last 1 Encounters:  01/01/23 89.5 kg   Ideal Body Weight:  89.1 kg  BMI:  Body mass index is 24.66 kg/m.  Estimated Nutritional Needs:   Kcal:  2300-2500  Protein:  120-130 grams  Fluid:  UOP + 1 L  Jacky Dross Magda Paganini, MS, RD, LDN, CNSC Pager number available on Amion

## 2023-01-02 NOTE — Progress Notes (Signed)
[facility]  PROGRESS NOTE  [name]:[id number] DOB: [birth year] DOA: 12/12/2022 PCP: Redmond School, MD   Brief HPI:   77 year old male with past medical history of ESRD on hemodialysis Monday Wednesday Friday, HFrEF, diabetes mellitus type 2, hypertension who presented with nausea.  Patient has known history of cholecystitis and underwent cholecystostomy tube placement.  His tube had become dislodged and he underwent evaluation with surgery.  He was noted to have recanalization of the bile duct therefore tube was not replaced.  He came to hospital for elective cholecystectomy underwent preop evaluation with cardiology.  His workup was notable for diffuse T wave inversions and prolonged QTc.  Also was found to be significantly hypokalemic.  He was started on electrolyte replacement.  Nephrology was consulted for chronic hemodialysis during hospitalization.  Despite improvement of Aulik parameters, ECG changes persisted so he was transferred to [facility] for cardiac catheterization on 12/17/2022.    Subjective   Patient seen and examined, Foulks to try oral food however does not have appetite.  Has been getting tube feeding at nighttime for past 1 week.   Assessment/Plan:     Chronic cholecystitis -Presented with malnutrition, failure to thrive -General surgery was consulted, underwent cholecystectomy on 12/28 with intraoperative cholangiogram  -Per general surgery; pneumoperitoneum is likely from lap chole done last week -No further intervention recommended  Chronic HFrEF, abnormal ECG -Repeat echo during this admission showed EF of 50 to 55% and 35 to 40% in August 2023 -Continue cardiac monitoring -Left heart cath on 12/26 showed multivessel diffuse moderate CAD with no acute or obvious flow-limiting lesions(in absence of any active anginal symptoms, would not PCI in preop setting)  -Medical management recommended, okayed to proceed with surgery as per cardiology -Started  on high-dose statin  ESRD -Nephrology consulted for hemodialysis  Hyperkalemia -Mild, potassium 5.3 -Patient on hemodialysis  Anemia of ESRD -ESA per nephrology -S/p 2 unit PRBC during this hospitalization -Hemoglobin stable at 8.4 this morning  Unstageable sacral ulcer -Foul-smelling decubitus wound noted which progressed as compared to previous imaging from 12/21 -Wound care consulted -Today after hydrotherapy patient developed fever with temperature 101 F -Blood cultures x 2 obtained, wound culture obtained;  -wound culture growing Enterococcus faecalis, Proteus mirabilis and gram-positive cocci -Started on empiric vancomycin and Zosyn, Zosyn was changed to cefepime which is currently stopped -antibiotics were changed  to ampicillin, stop date 01/06/2023 -sacral MRI ordered, 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 -Patient has been getting hydrotherapy for past 1 week -Wound care reconsulted today, recommended to stop hydrotherapy as wound has healed -Continue oxycodone 5 mg every 4 hours as needed -Continue local wound care as per wound care RN recommendation   Diabetes mellitus type 2 A1c 4.4 (likely reflecting his recent failure to thrive, poor PO intake) D/c SSI   Functional Quadriplegia  Essentially bedbound for the past year (needs help toileting, turning in bed, dressing, etc) -Unclear cause Follow outpatient  Hypophosphatemia -Replete  Hypomagnesemia -Replete  Protein calorie malnutrition -Patient had poor p.o. intake -BMI 22.98 kg/m -Started on core track nighttime feeding -core track feeding tube placed for additional nutrition to think subjective help heal sacral ulcer. -At this time patient has had tube feeding for about a week. -He wants to try oral diet. -Will discontinue core track feeding and encourage p.o. intake -If  patient's p.o. intake is  inadequate, will need to have discussion with patient's brother regarding PEG tube placement. -Palliative care consulted for goals of care    Medications     sodium chloride   Intravenous Once   allopurinol  100 mg Oral Daily   aspirin EC  81 mg Oral Daily   atorvastatin  40 mg Oral Daily   Chlorhexidine Gluconate Cloth  6 each Topical Q0600   cycloSPORINE  1 drop Both Eyes BID   darbepoetin (ARANESP) injection - DIALYSIS  200 mcg Subcutaneous Q Fri-1800   feeding supplement  237 mL Oral TID WC   feeding supplement (OSMOLITE 1.5 CAL)  1,000 mL Per Tube Q24H   feeding supplement (PROSource TF20)  60 mL Per Tube BID   Gerhardt's butt cream   Topical BID   heparin  5,000 Units Subcutaneous Q8H   isosorbide dinitrate  5 mg Oral BID   leptospermum manuka honey  1 Application Topical Daily   melatonin  4.5 mg Oral QHS   multivitamin  1 tablet Oral QHS   nutrition supplement (JUVEN)  1 packet Per Tube BID BM   polyethylene glycol  17 g Oral Daily   senna-docusate  1 tablet Oral BID   sodium chloride flush  3 mL Intravenous Q12H     Data Reviewed:   CBG:  Recent Labs  Lab 01/01/23 2004 01/02/23 0028 01/02/23 0425 01/02/23 0753 01/02/23 1213  GLUCAP 128* 154* 131* 136* 94    SpO2: 100 % O2 Flow Rate (L/min): 0 L/min    Vitals:   01/02/23 0001 01/02/23 0330 01/02/23 0809 01/02/23 1214  BP: 103/63 96/61 (!) 121/54 (!) 96/57  Pulse: 88 83 78   Resp: '14 18 18 18  '$ Temp: 98.4 F (36.9 C) 98 F (36.7 C) 98.4 F (36.9 C) 97.9 F (36.6 C)  TempSrc: Oral Oral Axillary Oral  SpO2: 100% 100% 100%   Weight:      Height:          Data Reviewed:  Basic Metabolic Panel: Recent Labs  Lab 12/28/22 0219 12/29/22 0132 12/30/22 0156 12/31/22 0617 01/01/23 0130 01/02/23 0131  NA  --  137 140 133* 136 134*  K  --  4.0 4.4 4.4 5.3* 4.6  CL  --  96* 100 96* 98 97*  CO2  --  '28 27 28 27 27  '$ GLUCOSE  --  200* 171* 181* 159* 149*  BUN  --  67* 88*  62* 93* 59*  CREATININE  --  3.89* 4.98* 3.18* 4.01* 2.47*  CALCIUM  --  9.4 9.5 9.1 9.4 9.1  MG 1.6* 2.1 2.0  --   --   --   PHOS 1.5* 1.6* 3.1  3.1 2.1* 2.3* 2.8    CBC: Recent Labs  Lab 12/27/22 0336 12/30/22 1614 12/31/22 0617 01/01/23 0130  WBC 10.4 12.6* 11.1* 11.6*  HGB 8.8* 6.3* 8.4* 7.9*  HCT 28.1* 20.4* 26.1* 24.0*  MCV 101.8* 102.0* 97.0 97.6  PLT 329 371 358 364    LFT Recent Labs  Lab 12/29/22 0132 12/30/22 0156 12/31/22 0617 01/01/23 0130 01/02/23 0131  ALBUMIN 2.1* 2.1* 1.9* 1.9* 1.9*     Antibiotics: Anti-infectives (From admission, onward)    Start     Dose/Rate Route Frequency Ordered Stop   12/30/22 2200  ampicillin (OMNIPEN) 2 g in sodium chloride 0.9 % 100 mL IVPB        2 g 300 mL/hr over 20 Minutes Intravenous Every 12 hours 12/30/22 1519 01/06/23 1559  12/30/22 1145  linezolid (ZYVOX) IVPB 600 mg  Status:  Discontinued        600 mg 300 mL/hr over 60 Minutes Intravenous Every 12 hours 12/30/22 1056 12/30/22 1519   12/28/22 0000  vancomycin (VANCOCIN) IVPB 1000 mg/200 mL premix  Status:  Discontinued        1,000 mg 200 mL/hr over 60 Minutes Intravenous Every M-W-F (Hemodialysis) 12/27/22 1354 12/30/22 1056   12/27/22 1445  vancomycin (VANCOCIN) IVPB 1000 mg/200 mL premix        1,000 mg 200 mL/hr over 60 Minutes Intravenous  Once 12/27/22 1354 12/27/22 1738   12/27/22 1200  vancomycin (VANCOCIN) IVPB 1000 mg/200 mL premix  Status:  Discontinued        1,000 mg 200 mL/hr over 60 Minutes Intravenous Every M-W-F (Hemodialysis) 12/25/22 1831 12/27/22 1354   12/26/22 2100  ceFEPIme (MAXIPIME) 1 g in sodium chloride 0.9 % 100 mL IVPB        1 g 200 mL/hr over 30 Minutes Intravenous Every 24 hours 12/26/22 1052 12/31/22 2034   12/25/22 1900  ceFEPIme (MAXIPIME) 1 g in sodium chloride 0.9 % 100 mL IVPB  Status:  Discontinued        1 g 200 mL/hr over 30 Minutes Intravenous Every 24 hours 12/25/22 1812 12/26/22 1052   12/25/22 1900  vancomycin  (VANCOREADY) IVPB 1750 mg/350 mL        1,750 mg 175 mL/hr over 120 Minutes Intravenous  Once 12/25/22 1812 12/26/22 0146   12/25/22 1900  metroNIDAZOLE (FLAGYL) IVPB 500 mg        500 mg 100 mL/hr over 60 Minutes Intravenous Every 12 hours 12/25/22 1812 01/01/23 0700   12/13/22 0600  cefoTEtan (CEFOTAN) 2 g in sodium chloride 0.9 % 100 mL IVPB  Status:  Discontinued        2 g 200 mL/hr over 30 Minutes Intravenous On call to O.R. 12/12/22 1631 12/14/22 0559   12/12/22 0615  cefoTEtan (CEFOTAN) 2 g in sodium chloride 0.9 % 100 mL IVPB  Status:  Discontinued        2 g 200 mL/hr over 30 Minutes Intravenous On call to O.R. 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 (Active)  11/06/22 1315  Location: Sacrum  Location Orientation: Mid  Staging: Stage 4 - Full thickness tissue loss with exposed bone, tendon or muscle.  Wound Description (Comments):   Present on Admission: Yes        [place]   [facility] If 7PM-7AM, please contact night-coverage at [email or web address], Office  [phone]   01/02/2023, 1:36 PM  LOS: 20 days

## 2023-01-03 DIAGNOSIS — I480 Paroxysmal atrial fibrillation: Secondary | ICD-10-CM | POA: Diagnosis not present

## 2023-01-03 DIAGNOSIS — K811 Chronic cholecystitis: Secondary | ICD-10-CM | POA: Diagnosis not present

## 2023-01-03 DIAGNOSIS — D649 Anemia, unspecified: Secondary | ICD-10-CM | POA: Diagnosis not present

## 2023-01-03 DIAGNOSIS — N186 End stage renal disease: Secondary | ICD-10-CM | POA: Diagnosis not present

## 2023-01-03 DIAGNOSIS — E1169 Type 2 diabetes mellitus with other specified complication: Secondary | ICD-10-CM | POA: Diagnosis not present

## 2023-01-03 LAB — TYPE AND SCREEN
ABO/RH(D): A POS
Antibody Screen: POSITIVE
DAT, IgG: NEGATIVE
Donor AG Type: NEGATIVE
Donor AG Type: NEGATIVE
Unit division: 0
Unit division: 0

## 2023-01-03 LAB — CBC
HCT: 25.9 % — ABNORMAL LOW (ref 39.0–52.0)
Hemoglobin: 8.4 g/dL — ABNORMAL LOW (ref 13.0–17.0)
MCH: 31.9 pg (ref 26.0–34.0)
MCHC: 32.4 g/dL (ref 30.0–36.0)
MCV: 98.5 fL (ref 80.0–100.0)
Platelets: 390 10*3/uL (ref 150–400)
RBC: 2.63 MIL/uL — ABNORMAL LOW (ref 4.22–5.81)
RDW: 18.5 % — ABNORMAL HIGH (ref 11.5–15.5)
WBC: 10.5 10*3/uL (ref 4.0–10.5)
nRBC: 0 % (ref 0.0–0.2)

## 2023-01-03 LAB — BPAM RBC
Blood Product Expiration Date: 202401152359
Blood Product Expiration Date: 202401172359
ISSUE DATE / TIME: 202312261103
ISSUE DATE / TIME: 202401082257
Unit Type and Rh: 6200
Unit Type and Rh: 6200

## 2023-01-03 LAB — RENAL FUNCTION PANEL
Albumin: 2.2 g/dL — ABNORMAL LOW (ref 3.5–5.0)
Anion gap: 13 (ref 5–15)
BUN: 88 mg/dL — ABNORMAL HIGH (ref 8–23)
CO2: 24 mmol/L (ref 22–32)
Calcium: 9.8 mg/dL (ref 8.9–10.3)
Chloride: 96 mmol/L — ABNORMAL LOW (ref 98–111)
Creatinine, Ser: 3.6 mg/dL — ABNORMAL HIGH (ref 0.61–1.24)
GFR, Estimated: 17 mL/min — ABNORMAL LOW (ref >60)
Glucose, Bld: 86 mg/dL (ref 70–99)
Phosphorus: 4.6 mg/dL (ref 2.5–4.6)
Potassium: 6.4 mmol/L (ref 3.5–5.1)
Sodium: 133 mmol/L — ABNORMAL LOW (ref 135–145)

## 2023-01-03 LAB — GLUCOSE, CAPILLARY
Glucose-Capillary: 106 mg/dL — ABNORMAL HIGH (ref 70–99)
Glucose-Capillary: 115 mg/dL — ABNORMAL HIGH (ref 70–99)
Glucose-Capillary: 129 mg/dL — ABNORMAL HIGH (ref 70–99)
Glucose-Capillary: 156 mg/dL — ABNORMAL HIGH (ref 70–99)
Glucose-Capillary: 93 mg/dL (ref 70–99)
Glucose-Capillary: 95 mg/dL (ref 70–99)

## 2023-01-03 LAB — POTASSIUM: Potassium: 4.5 mmol/L (ref 3.5–5.1)

## 2023-01-03 MED ORDER — HEPARIN SODIUM (PORCINE) 1000 UNIT/ML IJ SOLN: 4000.0000 [IU] | Freq: Once | INTRAMUSCULAR | Status: AC

## 2023-01-03 MED ORDER — HEPARIN SODIUM (PORCINE) 1000 UNIT/ML IJ SOLN
INTRAMUSCULAR | Status: AC
  Administered 2023-01-03: 1000 [IU]
  Filled 2023-01-03: qty 4

## 2023-01-03 MED ORDER — INSULIN ASPART 100 UNIT/ML IV SOLN
5.0000 [IU] | Freq: Once | INTRAVENOUS | Status: AC
  Administered 2023-01-03: 5 [IU] via INTRAVENOUS

## 2023-01-03 MED ORDER — DEXTROSE 50 % IV SOLN
1.0000 | Freq: Once | INTRAVENOUS | Status: AC
  Administered 2023-01-03: 50 mL via INTRAVENOUS
  Filled 2023-01-03: qty 50

## 2023-01-03 MED ORDER — HEPARIN SODIUM (PORCINE) 1000 UNIT/ML IJ SOLN
INTRAMUSCULAR | Status: AC
  Administered 2023-01-03: 4000 [IU] via INTRAVENOUS
  Filled 2023-01-03: qty 4

## 2023-01-03 MED ORDER — SODIUM ZIRCONIUM CYCLOSILICATE 10 G PO PACK
10.0000 g | PACK | Freq: Once | ORAL | Status: AC
  Administered 2023-01-03: 10 g via ORAL
  Filled 2023-01-03: qty 1

## 2023-01-03 NOTE — Progress Notes (Signed)
UF turned off due to drop in blood pressure, gave 148m ns bolus, RN aware

## 2023-01-03 NOTE — Progress Notes (Addendum)
Date and time results received: 01/03/23 0345      Test: Potassium   Critical Value: 6.4  Name of Provider Notified: T. Opyd MD  Orders Received? Or Actions Taken?: Orders Received - See Orders for details

## 2023-01-03 NOTE — Progress Notes (Signed)
[facility]  PROGRESS NOTE  JOELLE FLESSNER DEY:[id number] DOB: April 12, 1946 DOA: 12/12/2022 PCP: Redmond School, MD   Brief HPI:   77 year old male with past medical history of ESRD on hemodialysis Monday Wednesday Friday, HFrEF, diabetes mellitus type 2, hypertension who presented with nausea.  Patient has known history of cholecystitis and underwent cholecystostomy tube placement.  His tube had become dislodged and he underwent evaluation with surgery.  He was noted to have recanalization of the bile duct therefore tube was not replaced.  He came to hospital for elective cholecystectomy underwent preop evaluation with cardiology.  His workup was notable for diffuse T wave inversions and prolonged QTc.  Also was found to be significantly hypokalemic.  He was started on electrolyte replacement.  Nephrology was consulted for chronic hemodialysis during hospitalization.  Despite improvement of Aulik parameters, ECG changes persisted so he was transferred to [facility] for cardiac catheterization on 12/17/2022.    Subjective   .  Patient seen and examined, core track feeding tube was replaced yesterday.  Still has poor p.o. intake.   Assessment/Plan:     Chronic cholecystitis -Presented with malnutrition, failure to thrive -General surgery was consulted, underwent cholecystectomy on 12/28 with intraoperative cholangiogram  -Per general surgery; pneumoperitoneum is likely from lap chole done last week -No further intervention recommended  Chronic HFrEF, abnormal ECG -Repeat echo during this admission showed EF of 50 to 55% and 35 to 40% in August 2023 -Continue cardiac monitoring -Left heart cath on 12/26 showed multivessel diffuse moderate CAD with no acute or obvious flow-limiting lesions(in absence of any active anginal symptoms, would not PCI in preop setting)  -Medical management recommended, okayed to proceed with surgery as per cardiology -Started on high-dose  statin  ESRD -Nephrology consulted for hemodialysis  A-fib with RVR -Patient went into A-fib with RVR today -Will consult cardiology  Hyperkalemia -Mild, potassium 5.3 -Patient on hemodialysis  Anemia of ESRD -ESA per nephrology -S/p 2 unit PRBC during this hospitalization -Hemoglobin stable at 8.4 this morning  Unstageable sacral ulcer -Foul-smelling decubitus wound noted which progressed as compared to previous imaging from 12/21 -Wound care consulted -Today after hydrotherapy patient developed fever with temperature 101 F -Blood cultures x 2 obtained, wound culture obtained;  -wound culture growing Enterococcus faecalis, Proteus mirabilis and gram-positive cocci -Started on empiric vancomycin and Zosyn, Zosyn was changed to cefepime which is currently stopped -antibiotics were changed  to ampicillin, stop date 01/06/2023 -sacral MRI ordered, 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 -Patient has been getting hydrotherapy for past 1 week -Wound care reconsulted today, recommended to stop hydrotherapy as wound has healed -Continue oxycodone 5 mg every 4 hours as needed -Continue local wound care as per wound care RN recommendation   Diabetes mellitus type 2 A1c 4.4 (likely reflecting his recent failure to thrive, poor PO intake) D/c SSI   Functional Quadriplegia  Essentially bedbound for the past year (needs help toileting, turning in bed, dressing, etc) -Unclear cause Follow outpatient  Hypophosphatemia -Replete  Hypomagnesemia -Replete  Protein calorie malnutrition -Patient had poor p.o. intake -BMI 22.98 kg/m -Started on core track nighttime feeding -core track feeding tube placed for additional nutrition to think subjective help heal sacral ulcer. -At this time patient has had tube feeding for about a week. -He wants to try oral  diet. -Core track feeding tube was discontinued yesterday -  Started on p.o. diet, p.o. intake has been poor , will need to have discussion with patient's brother regarding PEG tube placement. -Palliative care consulted for goals of care    Medications     (feeding supplement) PROSource Plus  30 mL Oral BID BM   sodium chloride   Intravenous Once   allopurinol  100 mg Oral Daily   aspirin EC  81 mg Oral Daily   atorvastatin  40 mg Oral Daily   Chlorhexidine Gluconate Cloth  6 each Topical Q0600   cycloSPORINE  1 drop Both Eyes BID   darbepoetin (ARANESP) injection - DIALYSIS  200 mcg Subcutaneous Q Fri-1800   feeding supplement  237 mL Oral TID WC   Gerhardt's butt cream   Topical BID   heparin  5,000 Units Subcutaneous Q8H   isosorbide dinitrate  5 mg Oral BID   leptospermum manuka honey  1 Application Topical Daily   melatonin  4.5 mg Oral QHS   multivitamin  1 tablet Oral QHS   polyethylene glycol  17 g Oral Daily   senna-docusate  1 tablet Oral BID   sodium chloride flush  3 mL Intravenous Q12H     Data Reviewed:   CBG:  Recent Labs  Lab 01/02/23 1629 01/02/23 2021 01/03/23 0009 01/03/23 0457 01/03/23 0704  GLUCAP 133* 114* 95 129* 93    SpO2: 99 % O2 Flow Rate (L/min): 0 L/min    Vitals:   01/03/23 1030 01/03/23 1055 01/03/23 1100 01/03/23 1130  BP: 94/64 (!) 81/58 102/71 (!) 84/74  Pulse: (!) 102 (!) 101 96 99  Resp: '17 14 13 14  '$ Temp:      TempSrc:      SpO2:      Weight:      Height:          Data Reviewed:  Basic Metabolic Panel: Recent Labs  Lab 12/28/22 0219 12/29/22 0132 12/30/22 0156 12/31/22 0617 01/01/23 0130 01/02/23 0131 01/03/23 0301 01/03/23 0812  NA  --  137 140 133* 136 134* 133*  --   K  --  4.0 4.4 4.4 5.3* 4.6 6.4* 4.5  CL  --  96* 100 96* 98 97* 96*  --   CO2  --  '28 27 28 27 27 24  '$ --   GLUCOSE  --  200* 171* 181* 159* 149* 86  --   BUN  --  67* 88* 62* 93* 59* 88*  --   CREATININE  --  3.89* 4.98* 3.18* 4.01*  2.47* 3.60*  --   CALCIUM  --  9.4 9.5 9.1 9.4 9.1 9.8  --   MG 1.6* 2.1 2.0  --   --   --   --   --   PHOS 1.5* 1.6* 3.1  3.1 2.1* 2.3* 2.8 4.6  --     CBC: Recent Labs  Lab 12/30/22 1614 12/31/22 0617 01/01/23 0130 01/03/23 0812  WBC 12.6* 11.1* 11.6* 10.5  HGB 6.3* 8.4* 7.9* 8.4*  HCT 20.4* 26.1* 24.0* 25.9*  MCV 102.0* 97.0 97.6 98.5  PLT 371 358 364 390    LFT Recent Labs  Lab 12/30/22 0156 12/31/22 0617 01/01/23 0130 01/02/23 0131 01/03/23 0301  ALBUMIN 2.1* 1.9* 1.9* 1.9* 2.2*     Antibiotics: Anti-infectives (From admission, onward)    Start     Dose/Rate Route Frequency Ordered Stop   12/30/22 2200  ampicillin (OMNIPEN) 2 g in sodium chloride 0.9 % 100 mL IVPB  2 g 300 mL/hr over 20 Minutes Intravenous Every 12 hours 12/30/22 1519 01/06/23 1559   12/30/22 1145  linezolid (ZYVOX) IVPB 600 mg  Status:  Discontinued        600 mg 300 mL/hr over 60 Minutes Intravenous Every 12 hours 12/30/22 1056 12/30/22 1519   12/28/22 0000  vancomycin (VANCOCIN) IVPB 1000 mg/200 mL premix  Status:  Discontinued        1,000 mg 200 mL/hr over 60 Minutes Intravenous Every M-W-F (Hemodialysis) 12/27/22 1354 12/30/22 1056   12/27/22 1445  vancomycin (VANCOCIN) IVPB 1000 mg/200 mL premix        1,000 mg 200 mL/hr over 60 Minutes Intravenous  Once 12/27/22 1354 12/27/22 1738   12/27/22 1200  vancomycin (VANCOCIN) IVPB 1000 mg/200 mL premix  Status:  Discontinued        1,000 mg 200 mL/hr over 60 Minutes Intravenous Every M-W-F (Hemodialysis) 12/25/22 1831 12/27/22 1354   12/26/22 2100  ceFEPIme (MAXIPIME) 1 g in sodium chloride 0.9 % 100 mL IVPB        1 g 200 mL/hr over 30 Minutes Intravenous Every 24 hours 12/26/22 1052 12/31/22 2034   12/25/22 1900  ceFEPIme (MAXIPIME) 1 g in sodium chloride 0.9 % 100 mL IVPB  Status:  Discontinued        1 g 200 mL/hr over 30 Minutes Intravenous Every 24 hours 12/25/22 1812 12/26/22 1052   12/25/22 1900  vancomycin (VANCOREADY)  IVPB 1750 mg/350 mL        1,750 mg 175 mL/hr over 120 Minutes Intravenous  Once 12/25/22 1812 12/26/22 0146   12/25/22 1900  metroNIDAZOLE (FLAGYL) IVPB 500 mg        500 mg 100 mL/hr over 60 Minutes Intravenous Every 12 hours 12/25/22 1812 01/01/23 0700   12/13/22 0600  cefoTEtan (CEFOTAN) 2 g in sodium chloride 0.9 % 100 mL IVPB  Status:  Discontinued        2 g 200 mL/hr over 30 Minutes Intravenous On call to O.R. 12/12/22 1631 12/14/22 0559   12/12/22 0615  cefoTEtan (CEFOTAN) 2 g in sodium chloride 0.9 % 100 mL IVPB  Status:  Discontinued        2 g 200 mL/hr over 30 Minutes Intravenous On call to O.R. 12/12/22 7026 12/12/22 0913        DVT prophylaxis: Heparin  Code Status: Full code  Family Communication: No family at bedside   CONSULTS General surgery   Objective    Physical Examination:   Appears in no acute distress Heart S1-S2, regular Clear to auscultation bilaterally Abdomen is soft, nontender, no organomegaly     Status is: Inpatient:      Pressure Injury 11/06/22 Sacrum Mid Stage 4 - Full thickness tissue loss with exposed bone, tendon or muscle. (Active)  11/06/22 1315  Location: Sacrum  Location Orientation: Mid  Staging: Stage 4 - Full thickness tissue loss with exposed bone, tendon or muscle.  Wound Description (Comments):   Present on Admission: Yes        [place]   [facility] If 7PM-7AM, please contact night-coverage at [email or web address], Office  [phone]   01/03/2023, 11:33 AM  LOS: 21 days

## 2023-01-03 NOTE — Progress Notes (Signed)
[facility] Progress Note   Subjective:    Seen and examined patient on HD. Noted K+ 6.4 earlier this morning and Lokelma was given. K+ now 4.5. He is tolerating UFG 1L. He reports his bottom hurting and is requesting pain medication. Discussed with HD RN.  Objective Vitals:   01/03/23 0819 01/03/23 0830 01/03/23 0900 01/03/23 0920  BP: (!) 115/59 112/64 110/74 (!) 82/64  Pulse: (!) 40 96 99 (!) 103  Resp: '14 13 16 18  '$ Temp:      TempSrc:      SpO2: 99% 99% 99%   Weight:      Height:       Physical Exam General: Chronically ill adult male, NAD  Heart: RRR no MRG Lungs: CTA bilaterally nonlabored breathing Abdomen: NABS soft NTND, NG tube present Extremities: No lower extremity edema  dialysis Access: Right IJ [facility]  Filed Weights   01/01/23 0419 01/03/23 0342 01/03/23 0757  Weight: 89.5 kg 91.2 kg 90.7 kg    Intake/Output Summary (Last 24 hours) at 01/03/2023 0951 Last data filed at 01/03/2023 0515 Gross per 24 hour  Intake 2353.83 ml  Output --  Net 2353.83 ml    Additional Objective Labs: Basic Metabolic Panel: Recent Labs  Lab 01/01/23 0130 01/02/23 0131 01/03/23 0301 01/03/23 0812  NA 136 134* 133*  --   K 5.3* 4.6 6.4* 4.5  CL 98 97* 96*  --   CO2 '27 27 24  '$ --   GLUCOSE 159* 149* 86  --   BUN 93* 59* 88*  --   CREATININE 4.01* 2.47* 3.60*  --   CALCIUM 9.4 9.1 9.8  --   PHOS 2.3* 2.8 4.6  --    Liver Function Tests: Recent Labs  Lab 01/01/23 0130 01/02/23 0131 01/03/23 0301  ALBUMIN 1.9* 1.9* 2.2*   No results for input(s): "LIPASE", "AMYLASE" in the last 168 hours. CBC: Recent Labs  Lab 12/30/22 1614 12/31/22 0617 01/01/23 0130 01/03/23 0812  WBC 12.6* 11.1* 11.6* 10.5  HGB 6.3* 8.4* 7.9* 8.4*  HCT 20.4* 26.1* 24.0* 25.9*  MCV 102.0* 97.0 97.6 98.5  PLT 371 358 364 390   Blood Culture    Component Value Date/Time   SDES WOUND 12/25/2022 1715   SPECREQUEST NONE 12/25/2022 1715   CULT  12/25/2022 1715    FEW  ESCHERICHIA COLI ABUNDANT PROTEUS MIRABILIS ABUNDANT ENTEROCOCCUS FAECALIS VANCOMYCIN RESISTANT ENTEROCOCCUS    REPTSTATUS 12/29/2022 FINAL 12/25/2022 1715    Cardiac Enzymes: No results for input(s): "CKTOTAL", "CKMB", "CKMBINDEX", "TROPONINI" in the last 168 hours. CBG: Recent Labs  Lab 01/02/23 1629 01/02/23 2021 01/03/23 0009 01/03/23 0457 01/03/23 0704  GLUCAP 133* 114* 95 129* 93   Iron Studies: No results for input(s): "IRON", "TIBC", "TRANSFERRIN", "FERRITIN" in the last 72 hours. Lab Results  Component Value Date   INR 1.1 09/13/2022   INR 1.5 (H) 08/20/2022   INR 1.6 (H) 08/19/2022   Studies/Results: No results found.  Medications:  ampicillin (OMNIPEN) IV Stopped (01/03/23 0427)    (feeding supplement) PROSource Plus  30 mL Oral BID BM   sodium chloride   Intravenous Once   allopurinol  100 mg Oral Daily   aspirin EC  81 mg Oral Daily   atorvastatin  40 mg Oral Daily   Chlorhexidine Gluconate Cloth  6 each Topical Q0600   cycloSPORINE  1 drop Both Eyes BID   darbepoetin (ARANESP) injection - DIALYSIS  200 mcg Subcutaneous Q Fri-1800   feeding supplement  237 mL Oral TID WC   Gerhardt's butt cream   Topical BID   heparin  5,000 Units Subcutaneous Q8H   isosorbide dinitrate  5 mg Oral BID   leptospermum manuka honey  1 Application Topical Daily   melatonin  4.5 mg Oral QHS   multivitamin  1 tablet Oral QHS   polyethylene glycol  17 g Oral Daily   senna-docusate  1 tablet Oral BID   sodium chloride flush  3 mL Intravenous Q12H    Dialysis Orders: [facility] MWF 4h  400/800  84.5kg  2/2.5 bath   Heparin 4000 units IV  [facility]DC - last OP HD 12/20, post wt 85.9kg - last IP hD 12/24, post wt 85kg - mircera 225 mcg IV q2, last 12/18, due 1/02 - no vdra  Assessment/Plan: ESRD - on HD MWF. on schedule  /incre. heparin dose 4000 units pre and  mid tx 3000. K+ 5.3 and BUN 93 so will  need 4hr tx  ( if available staff). Noted K+ 6.4 earlier this morning and Lokelma  was given. K+ improved to 4.5. Now on HD. Chronic cholecystitis - w/ nausea, ( now resolved )  malnutrition, FTT.  S/p laparoscopic cholecystectomy 12/19/2022 Sacral decubitus ulcer =On IV antibiotics per primary Merdis Delay undergoing hydrotherapy, empiric Vanco and Zosyn per admit team sacral MRI "not done as patient has SNS "per admit team, admit team consulted surgery no surgical intervention currently needed/ noted notes now indicating some healing / was getting  NGT feeding  for nutrition to help healing  Chronic HFrEF - LVEF up to 50-55% here, diffuse CAD.  HTN/ volume - BP stable. No volume exces UF as tolerated. Anemia esrd -Hgb 8.4. S/p 1 unit PRBC, Aranesp 200 q Friday. Tsat low but infection picture and ferritin high. 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 On regular diet because of FTT/nutrition DM2 - per pmd   Nutrition -a.m. albumin low 1.9 PCM, FTT. On regular diet + protein supp. Coretrak placed for nutrition nutrition per admit plus per RD following Medication -on admit was on gabapentin dose inappropriate for ESRD pt. -Unclear indication -will d/c for now. Can resume at 300 mg q day if needed.   Tobie Poet, NP [facility] 01/03/2023,9:51 AM  LOS: 21 days

## 2023-01-03 NOTE — Progress Notes (Addendum)
   01/03/23 1231  Vitals  Temp 98.7 F (37.1 C)  Temp Source Oral  BP 113/61  BP Location Left Arm  BP Method Automatic  Patient Position (if appropriate) Lying  Pulse Rate 99  Pulse Rate Source Monitor  Resp 18  Oxygen Therapy  SpO2 99 %  O2 Device Room Air  Patient Activity (if Appropriate) In bed   Received patient in bed to unit.  Alert and oriented.  Informed consent signed and in chart.   Treatment initiated: 0819 Treatment completed: 1218  Tx completed w/ bp issues throughout tx. UF goal not met, blood rinsed back.  Attempted to call report to primary nurse before transport arrived to take back pt. Called her as transport arrived in unit to get pt. She was unable to receive report at that time Stated she would call me right back after attending to what she was attending to. She was made aware that pt would arrive back to his room before she called me back. She acknowledged this.   Transported back to the room  Alert, without acute distress.  Hand-off given to patient's nurse.   Access used: HD cath Access issues: NA  Total UF removed: 100 Medication(s) given:Heparin 4000 units bolus, Heparin Dwells 3800 units Post HD VS: see above Post HD weight: 90.8kg   [facility]

## 2023-01-03 NOTE — TOC Progression Note (Signed)
Transition of Care [facility]) - Progression Note    Patient Details  Name: [name] MRN: [id number] Date of Birth: [birth year]  Transition of Care [facility]) CM/SW [place], [name] Phone Number: 01/03/2023, 1:01 PM  Clinical Narrative:     Patient has SNF bed at [facility] . CSW following to start insurance authorization close to patient being medically ready for dc. CSW will continue to follow and assist with patients dc planning needs   Expected Discharge Plan: [place] Barriers to Discharge: Continued Medical Work up  Expected Discharge Plan and Services In-house Referral: Clinical Social Work   Post Acute Care Choice: [place] Living arrangements for the past 2 months: [place] Determinants of Health (SDOH) Interventions [place]: No Food Insecurity (09/13/2022)  Housing: Low Risk  (12/17/2022)  Transportation Needs: No Transportation Needs (12/17/2022)  Utilities: Not At Risk (12/17/2022)  Tobacco Use: Medium Risk (12/20/2022)    Readmission Risk Interventions    09/16/2022   12:31 PM 08/02/2022    2:23 PM  Readmission Risk Prevention Plan  Transportation Screening Complete Complete  Medication Review ([place]) Complete Complete  HRI or Home Care Consult Complete Complete  SW Recovery Care/Counseling Consult Complete Complete  Palliative Care Screening Not Applicable Not Applicable  Skilled Nursing Facility Not Applicable Complete

## 2023-01-03 NOTE — Progress Notes (Addendum)
Rounding Note    Patient Name: [name] Date of Encounter: 01/03/2023  [place] Cardiologist: Carlyle Dolly, MD   Subjective   Denies any discomfort. Post dialysis.   Inpatient Medications    Scheduled Meds:  (feeding supplement) PROSource Plus  30 mL Oral BID BM   sodium chloride   Intravenous Once   allopurinol  100 mg Oral Daily   aspirin EC  81 mg Oral Daily   atorvastatin  40 mg Oral Daily   Chlorhexidine Gluconate Cloth  6 each Topical Q0600   cycloSPORINE  1 drop Both Eyes BID   darbepoetin (ARANESP) injection - DIALYSIS  200 mcg Subcutaneous Q Fri-1800   feeding supplement  237 mL Oral TID WC   Gerhardt's butt cream   Topical BID   heparin  5,000 Units Subcutaneous Q8H   isosorbide dinitrate  5 mg Oral BID   leptospermum manuka honey  1 Application Topical Daily   melatonin  4.5 mg Oral QHS   multivitamin  1 tablet Oral QHS   polyethylene glycol  17 g Oral Daily   senna-docusate  1 tablet Oral BID   sodium chloride flush  3 mL Intravenous Q12H   Continuous Infusions:  ampicillin (OMNIPEN) IV Stopped (01/03/23 0427)   PRN Meds: acetaminophen, fentaNYL (SUBLIMAZE) injection, heparin, LORazepam, nitroGLYCERIN, oxyCODONE, polyethylene glycol, polyvinyl alcohol, prochlorperazine, simethicone, traMADol   Vital Signs    Vitals:   01/03/23 1218 01/03/23 1231 01/03/23 1304 01/03/23 1442  BP: 106/68 113/61 99/70 104/63  Pulse: (!) 102 99 (!) 111   Resp: 19 18    Temp:  98.7 F (37.1 C) 97.6 F (36.4 C)   TempSrc:  Oral Oral   SpO2: 99% 99% 99%   Weight:  90.8 kg    Height:        Intake/Output Summary (Last 24 hours) at 01/03/2023 1554 Last data filed at 01/03/2023 1218 Gross per 24 hour  Intake 2353.83 ml  Output 100 ml  Net 2253.83 ml      01/03/2023   12:31 PM 01/03/2023    7:57 AM 01/03/2023    3:42 AM  Last 3 Weights  Weight (lbs) 200 lb 2.8 oz 199 lb 15.3 oz 201 lb  Weight (kg) 90.8 kg 90.7 kg 91.173 kg      Telemetry     NSR with 1st degree AV block and PACs, no evidence of afib - Personally Reviewed  ECG    Sinus rhythm with 1st degree AV block - Personally Reviewed  Physical Exam   GEN: chronically ill appearing.    Neck: No JVD Cardiac: irregular, no murmurs, rubs, or gallops.  Respiratory: Clear to auscultation bilaterally. GI: Soft, nontender, non-distended  MS: No edema; No deformity. Neuro:  weak  Psych: Normal affect   Labs    High Sensitivity Troponin:   Recent Labs  Lab 12/13/22 0854  TROPONINIHS 30*     Chemistry Recent Labs  Lab 12/28/22 0219 12/29/22 0132 12/30/22 0156 12/31/22 0617 01/01/23 0130 01/02/23 0131 01/03/23 0301 01/03/23 0812  NA  --  137 140   < > 136 134* 133*  --   K  --  4.0 4.4   < > 5.3* 4.6 6.4* 4.5  CL  --  96* 100   < > 98 97* 96*  --   CO2  --  28 27   < > '27 27 24  '$ --   GLUCOSE  --  200* 171*   < >  159* 149* 86  --   BUN  --  67* 88*   < > 93* 59* 88*  --   CREATININE  --  3.89* 4.98*   < > 4.01* 2.47* 3.60*  --   CALCIUM  --  9.4 9.5   < > 9.4 9.1 9.8  --   MG 1.6* 2.1 2.0  --   --   --   --   --   ALBUMIN  --  2.1* 2.1*   < > 1.9* 1.9* 2.2*  --   GFRNONAA  --  15* 11*   < > 15* 26* 17*  --   ANIONGAP  --  13 13   < > '11 10 13  '$ --    < > = values in this interval not displayed.    Lipids No results for input(s): "CHOL", "TRIG", "HDL", "LABVLDL", "LDLCALC", "CHOLHDL" in the last 168 hours.  Hematology Recent Labs  Lab 12/31/22 0617 01/01/23 0130 01/03/23 0812  WBC 11.1* 11.6* 10.5  RBC 2.69* 2.46* 2.63*  HGB 8.4* 7.9* 8.4*  HCT 26.1* 24.0* 25.9*  MCV 97.0 97.6 98.5  MCH 31.2 32.1 31.9  MCHC 32.2 32.9 32.4  RDW 18.9* 18.8* 18.5*  PLT 358 364 390   Thyroid No results for input(s): "TSH", "FREET4" in the last 168 hours.  BNPNo results for input(s): "BNP", "PROBNP" in the last 168 hours.  DDimer No results for input(s): "DDIMER" in the last 168 hours.   Radiology    No results found.  Cardiac Studies   Echo  12/12/2022 1. Limited Echo to evaluate LVEF   2. Left ventricular ejection fraction, by estimation, is 50 to 55%. The  left ventricle has low normal function. The left ventricle has no regional  wall motion abnormalities. Left ventricular diastolic function could not  be evaluated.   3. The mitral valve is grossly normal. Mild mitral valve regurgitation.  No evidence of mitral stenosis.   4. The inferior vena cava is normal in size with greater than 50%  respiratory variability, suggesting right atrial pressure of 3 mmHg.   Comparison(s): Changes from prior study are noted. LVEF improved from  35-40% in 07/2022.   Patient Profile     77 y.o. male with PMH of CAD s/p DES to LAD and D1 in 10/2011, cardiomyopathy/chronic systolic CHF with baseline EF 35-40%, HTN, HLD, [place] 1 AV block, DM II and ESRD on HD who presented on 12/12/2022 for elective cholecystectomy, surgery was cancelled in the setting of newly abnormal ECG with deep inferior and anterolateral TWI and prolonged QTc in the setting of severe hypokalemia. Limited echo shows EF improved to 50-55%. Cardiology consulted for preop clearance and recommended the patient to be transferred to [facility] for cath. Patient underwent diagnostic cardiac cath on 12/17/2022 which showed moderate nonobstructive CAD. Patient had a questionable history of PAF, therefore BB stopped at [facility] in the setting of bradycardia. Patient is bedbound, has severe protein malnutrition and has sacral ulcer treated with bax.   Assessment & Plan    Questionable afib history: no evidence of afib on telemetry. Avoid BB given underlying conduction disorder  Abnormal preop EKG: underwent cath on 12/26, noted nonobstructive CAD  Cholecystitis: underwent lap chole by Dr. Barry Dienes on 12/19/2022  Cardiomyopathy/Chronic systolic CHF: Echo 07/5461 showed EF 35-40%  ESRD on HD: managed by nephrology service  Anemia of chronic kidney disease, required 1 unit of  PRBC  HTN: hypotensive now after dialysis  HLD  Sacral ulcer: bed bound.       For questions or updates, please contact [place] Please consult [email or web address] for contact info under     Signed, Almyra Deforest, [place]  01/03/2023, 3:54 PM   As above, patient seen and examined.  Patient denies chest pain or dyspnea.  No palpitations.  We were asked to reassess due to concern for atrial fibrillation.  I have reviewed the patient's telemetry and electrocardiograms.  This shows sinus rhythm with first-degree AV block and occasional nonconducted PACs.  I do not see evidence of atrial fibrillation.  Would avoid AV nodal blocking agents given conduction abnormalities at baseline.  Continue medical therapy for coronary artery disease including aspirin and statin. Kirk Ruths, MD

## 2023-01-03 NOTE — Progress Notes (Signed)
Potassium is 6.4 this morning.   Plan to check EKG, give Lokelma and insulin with dextrose, continue cardiac monitoring, and repeat potassium level later this am.

## 2023-01-04 DIAGNOSIS — K811 Chronic cholecystitis: Secondary | ICD-10-CM | POA: Diagnosis not present

## 2023-01-04 DIAGNOSIS — E440 Moderate protein-calorie malnutrition: Secondary | ICD-10-CM

## 2023-01-04 DIAGNOSIS — Z515 Encounter for palliative care: Secondary | ICD-10-CM

## 2023-01-04 DIAGNOSIS — E43 Unspecified severe protein-calorie malnutrition: Secondary | ICD-10-CM

## 2023-01-04 DIAGNOSIS — I5020 Unspecified systolic (congestive) heart failure: Secondary | ICD-10-CM

## 2023-01-04 DIAGNOSIS — Z7189 Other specified counseling: Secondary | ICD-10-CM | POA: Diagnosis not present

## 2023-01-04 DIAGNOSIS — L89150 Pressure ulcer of sacral region, unstageable: Secondary | ICD-10-CM | POA: Diagnosis not present

## 2023-01-04 LAB — GLUCOSE, CAPILLARY
Glucose-Capillary: 105 mg/dL — ABNORMAL HIGH (ref 70–99)
Glucose-Capillary: 108 mg/dL — ABNORMAL HIGH (ref 70–99)
Glucose-Capillary: 116 mg/dL — ABNORMAL HIGH (ref 70–99)
Glucose-Capillary: 137 mg/dL — ABNORMAL HIGH (ref 70–99)
Glucose-Capillary: 138 mg/dL — ABNORMAL HIGH (ref 70–99)
Glucose-Capillary: 91 mg/dL (ref 70–99)

## 2023-01-04 LAB — RENAL FUNCTION PANEL
Albumin: 1.9 g/dL — ABNORMAL LOW (ref 3.5–5.0)
Anion gap: 10 (ref 5–15)
BUN: 36 mg/dL — ABNORMAL HIGH (ref 8–23)
CO2: 29 mmol/L (ref 22–32)
Calcium: 9.2 mg/dL (ref 8.9–10.3)
Chloride: 96 mmol/L — ABNORMAL LOW (ref 98–111)
Creatinine, Ser: 2.43 mg/dL — ABNORMAL HIGH (ref 0.61–1.24)
GFR, Estimated: 27 mL/min — ABNORMAL LOW (ref >60)
Glucose, Bld: 99 mg/dL (ref 70–99)
Phosphorus: 3.4 mg/dL (ref 2.5–4.6)
Potassium: 4 mmol/L (ref 3.5–5.1)
Sodium: 135 mmol/L (ref 135–145)

## 2023-01-04 NOTE — Progress Notes (Signed)
   No afib noted yesterday as per consult note that I personally reviewed. No new recommendations at this time. Call with questions.  [place] will sign off.   Medication Recommendations:  as above Other recommendations (labs, testing, etc):   none Follow up as an outpatient:  Dr. Davene Costain, MD, [facility], [place] Director of the Advanced Lipid Disorders &  Cardiovascular Risk Reduction Clinic Diplomate of the American Board of Clinical Lipidology Attending Cardiologist  Direct Dial: [phone]  Fax: [phone]  Website:  [email or web address]

## 2023-01-04 NOTE — Consult Note (Signed)
Palliative Care Consult Note                                  Date: 01/04/2023   Patient Name: [name]  DOB: [birth year]  MRN: [id number]  Age / Sex: 77 y.o., male  PCP: Redmond School, MD Referring Physician: Elodia Florence., *  Reason for Consultation: Establishing goals of care  HPI/Patient Profile: 77 y.o. 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  He had known cholecystitis status post cholecystostomy tube placement that became dislodged and underwent elective cholecystectomy.  He was admitted on 12/12/2022 with chronic cholecystitis, chronic HFrEF with abnormal ECG, ESRD, A-fib with RVR, unstageable sacral ulcer, protein calorie malnutrition, and others.  PMT was consulted for [name] conversations.  Past Medical History:  Diagnosis Date   Anginal pain ([place])    Anxiety    Benign prostatic hypertrophy    Nocturia   CAD (coronary artery disease)    a.  NSTEMI 10/2012 s/p DES to LAD & DES to 1st diagonal with residual diffuse nonobstructive dz in LCx/RCA; b. 04/2022 MV: EF 55%, prior inferior MI. Small apical infarct w/ mild peri-infarct ischemia.   Cardiomyopathy ([place])    a. 03/2009 Echo: EF 50-55%; b. 01/2022 Echo: EF 60-65%; c. 07/2022 Echo: EF 35-40%, glob HK, worse @ apex. In Afib during study.   Degenerative joint disease    Depression    hx of   Diabetes mellitus, type II ([place]) 12/10/2012   Erectile dysfunction    ESRD (end stage renal disease) ([place])    a. HD initiated 07/2022.   Flu 2013   hx of   GERD (gastroesophageal reflux disease)    Gout    Hemorrhoids    High cholesterol    Hypertension    Exercise induced   Myocardial infarction [facility])    Neuropathy    bilateral legs   NSVT (nonsustained ventricular tachycardia) (HCC)    Exercise induced   Obesity    PAF (paroxysmal atrial fibrillation)  ([place])    a. CHA2DS2VASc = 6.  No OAC   Spinal cord stimulator    Tubular adenoma 2014   Vertigo    everyday    Subjective:   This NP Walden Field reviewed medical records, received report from team, assessed the patient and then meet at the patient's bedside to discuss diagnosis, prognosis, GOC, EOL wishes disposition and options.  I met with the patient at the bedside.  No family was present.   Concept of Palliative Care was introduced as specialized medical care for people and their families living with serious illness.  If focuses on providing relief from the symptoms and stress of a serious illness.  The goal is to improve quality of life for both the patient and the family. Values and goals of care important to patient and family were attempted to be elicited.  Created space and opportunity for patient  and family to explore thoughts and feelings regarding current medical situation   Natural trajectory and current clinical status were discussed. Questions and concerns addressed. Patient  encouraged to call with questions or concerns.    Patient/Family Understanding of Illness: We had a discussion about his chronic health problems and his acute health problems.  He seems to understand his health pretty well, although that he does need prompting for  specifics.  We discussed that his biggest issue right now is malnutrition and poor oral intake.  Life Review: His wife passed away 13 years ago.  He has a brother, whom he names as his surrogate Media planner.  He previously worked in [employer] for [employer].  He also notes that "I love the Lord."    Patient Values: Continued life  Goals: Full code, full scope of care.  Unsure at this point about whether he would want prolonged ventilatory support.  He is okay with PEG tube to "keep living."  Today's Discussion: In addition to discussions described above I had extensive discussion with the patient on various  topics.  We further delved into his health problems.  He states his gallbladder is doing well since cholecystectomy.  He knows he has heart failure but is doing pretty well with medical management.  He does have some pain with the sacral wound, though he was told that it is doing better.  We talked about his big issue which is malnutrition.  He notes that he did previously have a core track tube in place, does not seem to of like that.  He states he has been trying to eat but generally "not hungry".  He states he ate 50% of his breakfast, lunch has not gone here today.  At the end of our visit the nurse came to help him order lunch.  We discussed that the body needs good nutrition to heal from surgery and to heal wounds.  I asked if he would be agreeable to a permanent feeding tube surgically placed in the side of his stomach.  His eyes got very wide and he said "events that I need to live that yes."  I told him that oral intake is always the preferred route encouraged him to continue to try to eat, even if he is not necessarily hungry.  He verbalized understanding.  I offered chaplain services but he stated he was okay at this point.  I encouraged him to let us know if he changes his mind.  I provided emotional and general support through therapeutic listening, empathy, sharing of stories, and other techniques. I answered all questions and addressed all concerns to the best of my ability.  Review of Systems  Constitutional:  Positive for fatigue.  Respiratory:  Negative for shortness of breath.   Gastrointestinal:  Negative for abdominal pain, nausea and vomiting.  Musculoskeletal:  Positive for back pain (Lower back sacral wound).    Objective:   Primary Diagnoses: Present on Admission:  Chronic cholecystitis  Nausea and vomiting  Diabetes mellitus type 2 in obese (HCC)  Anemia, normocytic normochromic  HFrEF (heart failure with reduced ejection fraction) (HCC)  ESRD (end stage renal  disease) ([place])  Essential hypertension  Pressure injury of skin of sacral region  Prolonged QT interval  Hypokalemia  Coronary artery disease involving native coronary artery of native heart without angina pectoris  Heart failure with improved ejection fraction (HFimpEF) (HCC)  Abnormal EKG   Physical Exam Vitals and nursing note reviewed.  Constitutional:      General: He is not in acute distress.    Appearance: He is ill-appearing.  HENT:     Head: Normocephalic and atraumatic.  Cardiovascular:     Rate and Rhythm: Regular rhythm. Tachycardia present.  Pulmonary:     Effort: Pulmonary effort is normal. No respiratory distress.     Breath sounds: No wheezing or rhonchi.  Abdominal:  General: Abdomen is flat. Bowel sounds are normal. There is no distension.     Palpations: Abdomen is soft.  Skin:    General: Skin is warm and dry.  Neurological:     General: No focal deficit present.     Mental Status: He is alert.  Psychiatric:        Mood and Affect: Mood normal.        Behavior: Behavior normal.     Vital Signs:  BP 101/65 (BP Location: Left Arm)   Pulse 89   Temp 98.6 F (37 C) (Oral)   Resp 16   Ht '6\' 3"'$  (1.905 m)   Wt 88.5 kg   SpO2 100%   BMI 24.37 kg/m   Palliative Assessment/Data: 20-30%    Advanced Care Planning:   Existing Vynca/ACP Documentation: None  Primary Decision Maker: NEXT OF KIN  Code Status/Advance Care Planning: Full code  A discussion was had today regarding advanced directives. Concepts specific to code status, artifical feeding and hydration, continued IV antibiotics and rehospitalization was had.  The difference between a aggressive medical intervention path and a palliative comfort care path for this patient at this time was had.   Decisions/Changes to ACP: None today Continue full code Full scope of care, including PEG tube if needed  Assessment & Plan:   Impression: 77 year old male with chronic and acute  comorbidities and presentations as described above.  His biggest issue right now is malnutrition.  Previously had a core track though this is not longer in place.  Has been cleared for oral intake but having poor intake.  Encouraged him to continue to try to eat by mouth.  He is accepting of a PEG tube if it is necessary for nutrition in order to allow him to "keep living".  He names his brother [name] as his Air traffic controller.  Overall long-term prognosis guarded to poor  SUMMARY OF RECOMMENDATIONS   Continue full code Full scope of care Agreeable to PEG tube Time for outcomes regarding nutrition PMT will follow-up in a couple days  Symptom Management:  Per primary team PMT is available to assist as needed  Prognosis:  Unable to determine  Discharge Planning:  To Be Determined   Discussed with: Patient, medical team, nursing team    Thank you for allowing [place] to participate in the care of DONSHAY LUPINSKI PMT will continue to support holistically.  Time Total: 90  Greater than 50%  of this time was spent counseling and coordinating care related to the above assessment and plan.  Signed by: Walden Field, NP Palliative Medicine Team  Team Phone # [phone] (Nights/Weekends)  01/04/2023, 2:14 PM

## 2023-01-04 NOTE — Progress Notes (Signed)
PROGRESS NOTE    [name]  XNT:[id number] DOB: [birth year] DOA: 12/12/2022 PCP: Redmond School, MD  No chief complaint on file.   Brief Narrative:  77 year old male with past medical history of ESRD on hemodialysis Monday Wednesday Friday, HFrEF, diabetes mellitus type 2, hypertension who presented with nausea. Patient has known history of cholecystitis and underwent cholecystostomy tube placement. His tube had become dislodged and he underwent evaluation with surgery. He was noted to have recanalization of the bile duct therefore tube was not replaced. He came to hospital for elective cholecystectomy underwent preop evaluation with cardiology. His workup was notable for diffuse T wave inversions and prolonged QTc. Also was found to be significantly hypokalemic. He was started on electrolyte replacement. Nephrology was consulted for chronic hemodialysis during hospitalization. Despite improvement of Aulik parameters, ECG changes persisted so he was transferred to [facility] for cardiac catheterization on 12/17/2022.   Assessment & Plan:   Principal Problem:   Chronic cholecystitis Active Problems:   ESRD (end stage renal disease) ([place])   Coronary artery disease involving native coronary artery of native heart without angina pectoris   Hypokalemia   Diabetes mellitus type 2 in obese (HCC)   Anemia, normocytic normochromic   Essential hypertension   Heart failure with improved ejection fraction (HFimpEF) (HCC)   HFrEF (heart failure with reduced ejection fraction) (HCC)   Nausea and vomiting   Pressure injury of skin of sacral region   Prolonged QT interval   Generalized weakness   Abnormal EKG   Preoperative cardiovascular examination   Protein-calorie malnutrition, severe   Malnutrition of moderate degree   Paroxysmal atrial fibrillation (HCC)  Chronic cholecystitis -Presented with malnutrition, failure to thrive -General surgery was consulted, underwent cholecystectomy on 12/28  with intraoperative cholangiogram  -Per general surgery; pneumoperitoneum is likely from lap chole done last week -No further intervention recommended   Chronic HFrEF, abnormal ECG -Repeat echo during this admission showed EF of 50 to 55% and 35 to 40% in August 2023 -Continue cardiac monitoring -Left heart cath on 12/26 showed multivessel diffuse moderate CAD with no acute or obvious flow-limiting lesions(in absence of any active anginal symptoms, would not PCI in preop setting)  -Medical management recommended, okayed to proceed with surgery as per cardiology -Started on high-dose statin   ESRD -Nephrology consulted for hemodialysis   Concern for Afib -none noted per cards, will continue to monitor   Hyperkalemia -improved   Anemia of ESRD -ESA per nephrology -S/p 2 unit PRBC during this hospitalization -Hemoglobin stable at 8.4 this morning   Unstageable sacral ulcer -wound culture growing e. Coli resistant to ampicillin/unasyn, e faecalis resistant to vancomycin, pansensitive proteus  -completed course of hydrotherapy -CT with subcutaneous fat straining with complex collection posterior to the sacrum on the left measuring 2.5x1.2cm concerning for abscess, no evidence of osteo - vancomycin 1/3 - 1/5 - cefepime 1/3 - 1/9 - flagyl 1/3-1/9  - ampicillin 1/8 - present  - E. Coli and proteus treated x 7 days with cefepime.  E. Faecalis currently being treated with ampicillin.  Plan to complete on 1/15. -sacral MRI ordered, not done as patient has SNS -per surgery, no abscess but packing in the place of ulcer. -Patient has been getting hydrotherapy for past 1 week -1/9 woc recommending stop hydrotherapy/dakins -> medihoney to inner wound and outer black wound q day, fill with gauze, cover with foam, change foam q3 days or prn soiling -Continue oxycodone 5 mg every 4 hours as needed -Continue local wound  care as per wound care RN recommendation   Diabetes mellitus type 2 A1c 4.4  (likely reflecting his recent failure to thrive, poor PO intake) D/c SSI   Functional Quadriplegia  Essentially bedbound for the past year (needs help toileting, turning in bed, dressing, etc) -Unclear cause Follow outpatient   Hypophosphatemia -Replete   Hypomagnesemia -Replete   Moderate Protein calorie malnutrition  Poor PO intake -BMI 22.98 kg/m -had cortrak to supplement nutrition in setting of his decubitu ulcer, but this was d/c'd after about 1 week  -per palliative care discussion, agreeable to PEG, but will discuss further with him and family as well as watch his PO intake     DVT prophylaxis: heparin Code Status: full Family Communication: none Disposition:   Status is: Inpatient Remains inpatient appropriate because: continued poor PO intake, IV abx   Consultants:    Procedures:  LHC  Prox RCA-1 lesion is 50% stenosed. Prox RCA-2 lesion is 20% stenosed.   Mid RCA to Dist RCA lesion is 65% stenosed.   RPDA lesion is 60% stenosed.   RPAV lesion is 15% stenosed.  2nd RPL lesion is 30% stenosed.   Dist LAD-1 lesion is 55% stenosed. Dist/apical LAD-2 lesion is 75% stenosed.   Dist/apical LAD-2 lesion is 75% stenosed.   Prox 3rd Mrg is 40% stenosed   LV end diastolic pressure is normal.   POST CATH DIAGNOSES Mulitvessel diffuse Moderate CAD with no acute or obvious Flow-Limiting lesions. Prox RCA eccentril 50-55%, distal RCA eccentric calcified shelf lesion ~60-65% & ostial rPDA 60% --> In tandem could potentialy be RFR/FFR positive. However, in the absence of any ongoing active anginal symptoms, would not PCI in the preop setting. Early distal LAD eccentric 55 to 60% and apical 70% stenosed this cannot amenable for PCI. Otherwise diffuse mild to moderate disease in the LCx Normal LVEDP-normal EF by Echo      RECOMMENDATIONS Aggressive to direct medical therapy for moderate diffuse CAD.  Monitor for symptoms.  But for now would simply treat  medically. Would proceed with planned operation without further cardiac evaluation given nonobstructive CAD and normal echo.   Echo IMPRESSIONS     1. Limited Echo to evaluate LVEF   2. Left ventricular ejection fraction, by estimation, is 50 to 55%. The  left ventricle has low normal function. The left ventricle has no regional  wall motion abnormalities. Left ventricular diastolic function could not  be evaluated.   3. The mitral valve is grossly normal. Mild mitral valve regurgitation.  No evidence of mitral stenosis.   4. The inferior vena cava is normal in size with greater than 50%  respiratory variability, suggesting right atrial pressure of 3 mmHg.   Comparison(s): Changes from prior study are noted. LVEF improved from  35-40% in 07/2022.   Antimicrobials:  Anti-infectives (From admission, onward)    Start     Dose/Rate Route Frequency Ordered Stop   12/30/22 2200  ampicillin (OMNIPEN) 2 g in sodium chloride 0.9 % 100 mL IVPB        2 g 300 mL/hr over 20 Minutes Intravenous Every 12 hours 12/30/22 1519 01/06/23 1559   12/30/22 1145  linezolid (ZYVOX) IVPB 600 mg  Status:  Discontinued        600 mg 300 mL/hr over 60 Minutes Intravenous Every 12 hours 12/30/22 1056 12/30/22 1519   12/28/22 0000  vancomycin (VANCOCIN) IVPB 1000 mg/200 mL premix  Status:  Discontinued        1,000 mg 200  mL/hr over 60 Minutes Intravenous Every M-W-F (Hemodialysis) 12/27/22 1354 12/30/22 1056   12/27/22 1445  vancomycin (VANCOCIN) IVPB 1000 mg/200 mL premix        1,000 mg 200 mL/hr over 60 Minutes Intravenous  Once 12/27/22 1354 12/27/22 1738   12/27/22 1200  vancomycin (VANCOCIN) IVPB 1000 mg/200 mL premix  Status:  Discontinued        1,000 mg 200 mL/hr over 60 Minutes Intravenous Every M-W-F (Hemodialysis) 12/25/22 1831 12/27/22 1354   12/26/22 2100  ceFEPIme (MAXIPIME) 1 g in sodium chloride 0.9 % 100 mL IVPB        1 g 200 mL/hr over 30 Minutes Intravenous Every 24 hours 12/26/22  1052 12/31/22 2034   12/25/22 1900  ceFEPIme (MAXIPIME) 1 g in sodium chloride 0.9 % 100 mL IVPB  Status:  Discontinued        1 g 200 mL/hr over 30 Minutes Intravenous Every 24 hours 12/25/22 1812 12/26/22 1052   12/25/22 1900  vancomycin (VANCOREADY) IVPB 1750 mg/350 mL        1,750 mg 175 mL/hr over 120 Minutes Intravenous  Once 12/25/22 1812 12/26/22 0146   12/25/22 1900  metroNIDAZOLE (FLAGYL) IVPB 500 mg        500 mg 100 mL/hr over 60 Minutes Intravenous Every 12 hours 12/25/22 1812 01/01/23 0700   12/13/22 0600  cefoTEtan (CEFOTAN) 2 g in sodium chloride 0.9 % 100 mL IVPB  Status:  Discontinued        2 g 200 mL/hr over 30 Minutes Intravenous On call to O.R. 12/12/22 1631 12/14/22 0559   12/12/22 0615  cefoTEtan (CEFOTAN) 2 g in sodium chloride 0.9 % 100 mL IVPB  Status:  Discontinued        2 g 200 mL/hr over 30 Minutes Intravenous On call to O.R. 12/12/22 0614 12/12/22 0913       Subjective: No complaints today  Objective: Vitals:   01/04/23 0015 01/04/23 0405 01/04/23 0736 01/04/23 1120  BP: 97/61 104/63 118/75 101/65  Pulse: 93 84 87 89  Resp: '18 20 20 16  '$ Temp: (!) 97.5 F (36.4 C) 97.6 F (36.4 C) 97.8 F (36.6 C) 98.6 F (37 C)  TempSrc: Oral Oral Oral Oral  SpO2: 99% 97% 98% 100%  Weight:  88.5 kg    Height:        Intake/Output Summary (Last 24 hours) at 01/04/2023 1553 Last data filed at 01/04/2023 0316 Gross per 24 hour  Intake 100 ml  Output --  Net 100 ml   Filed Weights   01/03/23 0757 01/03/23 1231 01/04/23 0405  Weight: 90.7 kg 90.8 kg 88.5 kg    Examination:  General exam: Appears calm and comfortable  Respiratory system: unlabored Cardiovascular system: RRR Gastrointestinal system: Abdomen is nondistended, soft and nontender. Central nervous system: Alert and oriented. No focal neurological deficits. Decub not evaluated today Extremities: no LEE    Data Reviewed: I have personally reviewed following labs and imaging  studies  CBC: Recent Labs  Lab 12/30/22 1614 12/31/22 0617 01/01/23 0130 01/03/23 0812  WBC 12.6* 11.1* 11.6* 10.5  HGB 6.3* 8.4* 7.9* 8.4*  HCT 20.4* 26.1* 24.0* 25.9*  MCV 102.0* 97.0 97.6 98.5  PLT 371 358 364 505    Basic Metabolic Panel: Recent Labs  Lab 12/29/22 0132 12/30/22 0156 12/31/22 0617 01/01/23 0130 01/02/23 0131 01/03/23 0301 01/03/23 0812 01/04/23 0125  NA 137 140 133* 136 134* 133*  --  135  K 4.0 4.4 4.4  5.3* 4.6 6.4* 4.5 4.0  CL 96* 100 96* 98 97* 96*  --  96*  CO2 '28 27 28 27 27 24  '$ --  29  GLUCOSE 200* 171* 181* 159* 149* 86  --  99  BUN 67* 88* 62* 93* 59* 88*  --  36*  CREATININE 3.89* 4.98* 3.18* 4.01* 2.47* 3.60*  --  2.43*  CALCIUM 9.4 9.5 9.1 9.4 9.1 9.8  --  9.2  MG 2.1 2.0  --   --   --   --   --   --   PHOS 1.6* 3.1  3.1 2.1* 2.3* 2.8 4.6  --  3.4    GFR: Estimated Creatinine Clearance: 30.4 mL/min (Jaquelyn Sakamoto) (by C-G formula based on SCr of 2.43 mg/dL (H)).  Liver Function Tests: Recent Labs  Lab 12/31/22 0617 01/01/23 0130 01/02/23 0131 01/03/23 0301 01/04/23 0125  ALBUMIN 1.9* 1.9* 1.9* 2.2* 1.9*    CBG: Recent Labs  Lab 01/03/23 2017 01/04/23 0008 01/04/23 0418 01/04/23 0744 01/04/23 1139  GLUCAP 106* 116* 108* 105* 137*     Recent Results (from the past 240 hour(s))  Culture, blood (Routine X 2) w Reflex to ID Panel     Status: None   Collection Time: 12/25/22  4:24 PM   Specimen: BLOOD  Result Value Ref Range Status   Specimen Description BLOOD SITE NOT SPECIFIED  Final   Special Requests IN PEDIATRIC BOTTLE Blood Culture adequate volume  Final   Culture   Final    NO GROWTH 5 DAYS Performed at [facility], [place] [address] [place], [place] [address]    Report Status 12/30/2022 FINAL  Final  Culture, blood (Routine X 2) w Reflex to ID Panel     Status: None   Collection Time: 12/25/22  4:45 PM   Specimen: BLOOD  Result Value Ref Range Status   Specimen Description BLOOD SITE NOT SPECIFIED  Final    Special Requests   Final    BOTTLES DRAWN AEROBIC ONLY Blood Culture adequate volume   Culture   Final    NO GROWTH 5 DAYS Performed at [facility], [place] [address], [place], [place] [address]    Report Status 12/30/2022 FINAL  Final  Aerobic Culture w Gram Stain (superficial specimen)     Status: None   Collection Time: 12/25/22  5:15 PM   Specimen: Wound  Result Value Ref Range Status   Specimen Description WOUND  Final   Special Requests NONE  Final   Gram Stain   Final    NO WBC SEEN MODERATE GRAM POSITIVE COCCI IN PAIRS MODERATE GRAM NEGATIVE RODS FEW GRAM POSITIVE RODS Performed at [facility], [address] [place],  [address]    Culture   Final    FEW ESCHERICHIA COLI ABUNDANT PROTEUS MIRABILIS ABUNDANT ENTEROCOCCUS FAECALIS VANCOMYCIN RESISTANT ENTEROCOCCUS    Report Status 12/29/2022 FINAL  Final   Organism ID, Bacteria ESCHERICHIA COLI  Final   Organism ID, Bacteria PROTEUS MIRABILIS  Final   Organism ID, Bacteria ENTEROCOCCUS FAECALIS  Final      Susceptibility   Escherichia coli - MIC*    AMPICILLIN >=32 RESISTANT Resistant     CEFAZOLIN <=4 SENSITIVE Sensitive     CEFEPIME <=0.12 SENSITIVE Sensitive     CEFTAZIDIME <=1 SENSITIVE Sensitive     CEFTRIAXONE <=0.25 SENSITIVE Sensitive     CIPROFLOXACIN <=0.25 SENSITIVE Sensitive     GENTAMICIN <=1 SENSITIVE Sensitive  IMIPENEM <=0.25 SENSITIVE Sensitive     TRIMETH/SULFA <=20 SENSITIVE Sensitive     AMPICILLIN/SULBACTAM 16 INTERMEDIATE Intermediate     PIP/TAZO <=4 SENSITIVE Sensitive     * FEW ESCHERICHIA COLI   Enterococcus faecalis - MIC*    AMPICILLIN <=2 SENSITIVE Sensitive     VANCOMYCIN >=32 RESISTANT Resistant     GENTAMICIN SYNERGY RESISTANT Resistant     LINEZOLID 1 SENSITIVE Sensitive     * ABUNDANT ENTEROCOCCUS FAECALIS   Proteus mirabilis - MIC*    AMPICILLIN <=2 SENSITIVE Sensitive     CEFAZOLIN <=4 SENSITIVE Sensitive     CEFEPIME <=0.12 SENSITIVE Sensitive      CEFTAZIDIME <=1 SENSITIVE Sensitive     CEFTRIAXONE <=0.25 SENSITIVE Sensitive     CIPROFLOXACIN <=0.25 SENSITIVE Sensitive     GENTAMICIN <=1 SENSITIVE Sensitive     IMIPENEM 2 SENSITIVE Sensitive     TRIMETH/SULFA <=20 SENSITIVE Sensitive     AMPICILLIN/SULBACTAM <=2 SENSITIVE Sensitive     PIP/TAZO <=4 SENSITIVE Sensitive     * ABUNDANT PROTEUS MIRABILIS         Radiology Studies: No results found.      Scheduled Meds:  (feeding supplement) PROSource Plus  30 mL Oral BID BM   sodium chloride   Intravenous Once   allopurinol  100 mg Oral Daily   aspirin EC  81 mg Oral Daily   atorvastatin  40 mg Oral Daily   Chlorhexidine Gluconate Cloth  6 each Topical Q0600   cycloSPORINE  1 drop Both Eyes BID   darbepoetin (ARANESP) injection - DIALYSIS  200 mcg Subcutaneous Q Fri-1800   feeding supplement  237 mL Oral TID WC   Gerhardt's butt cream   Topical BID   heparin  5,000 Units Subcutaneous Q8H   isosorbide dinitrate  5 mg Oral BID   leptospermum manuka honey  1 Application Topical Daily   melatonin  4.5 mg Oral QHS   multivitamin  1 tablet Oral QHS   polyethylene glycol  17 g Oral Daily   senna-docusate  1 tablet Oral BID   sodium chloride flush  3 mL Intravenous Q12H   Continuous Infusions:  ampicillin (OMNIPEN) IV 2 g (01/04/23 0408)     LOS: 22 days    Time spent: over 30 min    Fayrene Helper, MD [facility]   To contact the attending provider between 7A-7P or the covering provider during after hours 7P-7A, please log into the web site [email or web address] and access using universal [place] password for that web site. If you do not have the password, please call the hospital operator.  01/04/2023, 3:53 PM

## 2023-01-04 NOTE — Progress Notes (Signed)
[facility] Progress Note   Subjective:    Seen and examined patient at bedside. Noted minimal net UF during yesterday's HD d/t BP dropping. SBPs ranging 90-110s today. Ordered small NS bolus per primary. Next HD 1/15.  Objective Vitals:   01/03/23 1937 01/04/23 0015 01/04/23 0405 01/04/23 0736  BP: (!) 106/59 97/61 104/63 118/75  Pulse: 100 93 84 87  Resp: '19 18 20 20  '$ Temp:  (!) 97.5 F (36.4 C) 97.6 F (36.4 C) 97.8 F (36.6 C)  TempSrc:  Oral Oral Oral  SpO2: 97% 99% 97% 98%  Weight:   88.5 kg   Height:       Physical Exam General: Chronically ill adult male, NAD  Heart: RRR no MRG Lungs: CTA bilaterally nonlabored breathing Abdomen: NABS soft NTND, NG tube present Extremities: No lower extremity edema  dialysis Access: Right IJ [facility]  Filed Weights   01/03/23 0757 01/03/23 1231 01/04/23 0405  Weight: 90.7 kg 90.8 kg 88.5 kg    Intake/Output Summary (Last 24 hours) at 01/04/2023 1046 Last data filed at 01/04/2023 0316 Gross per 24 hour  Intake 100 ml  Output 100 ml  Net 0 ml    Additional Objective Labs: Basic Metabolic Panel: Recent Labs  Lab 01/02/23 0131 01/03/23 0301 01/03/23 0812 01/04/23 0125  NA 134* 133*  --  135  K 4.6 6.4* 4.5 4.0  CL 97* 96*  --  96*  CO2 27 24  --  29  GLUCOSE 149* 86  --  99  BUN 59* 88*  --  36*  CREATININE 2.47* 3.60*  --  2.43*  CALCIUM 9.1 9.8  --  9.2  PHOS 2.8 4.6  --  3.4   Liver Function Tests: Recent Labs  Lab 01/02/23 0131 01/03/23 0301 01/04/23 0125  ALBUMIN 1.9* 2.2* 1.9*   No results for input(s): "LIPASE", "AMYLASE" in the last 168 hours. CBC: Recent Labs  Lab 12/30/22 1614 12/31/22 0617 01/01/23 0130 01/03/23 0812  WBC 12.6* 11.1* 11.6* 10.5  HGB 6.3* 8.4* 7.9* 8.4*  HCT 20.4* 26.1* 24.0* 25.9*  MCV 102.0* 97.0 97.6 98.5  PLT 371 358 364 390   Blood Culture    Component Value Date/Time   SDES WOUND 12/25/2022 1715   SPECREQUEST NONE 12/25/2022 1715   CULT  12/25/2022  1715    FEW ESCHERICHIA COLI ABUNDANT PROTEUS MIRABILIS ABUNDANT ENTEROCOCCUS FAECALIS VANCOMYCIN RESISTANT ENTEROCOCCUS    REPTSTATUS 12/29/2022 FINAL 12/25/2022 1715    Cardiac Enzymes: No results for input(s): "CKTOTAL", "CKMB", "CKMBINDEX", "TROPONINI" in the last 168 hours. CBG: Recent Labs  Lab 01/03/23 1658 01/03/23 2017 01/04/23 0008 01/04/23 0418 01/04/23 0744  GLUCAP 156* 106* 116* 108* 105*   Iron Studies: No results for input(s): "IRON", "TIBC", "TRANSFERRIN", "FERRITIN" in the last 72 hours. Lab Results  Component Value Date   INR 1.1 09/13/2022   INR 1.5 (H) 08/20/2022   INR 1.6 (H) 08/19/2022   Studies/Results: No results found.  Medications:  ampicillin (OMNIPEN) IV 2 g (01/04/23 0408)    (feeding supplement) PROSource Plus  30 mL Oral BID BM   sodium chloride   Intravenous Once   allopurinol  100 mg Oral Daily   aspirin EC  81 mg Oral Daily   atorvastatin  40 mg Oral Daily   Chlorhexidine Gluconate Cloth  6 each Topical Q0600   cycloSPORINE  1 drop Both Eyes BID   darbepoetin (ARANESP) injection - DIALYSIS  200 mcg Subcutaneous Q Fri-1800   feeding supplement  237 mL Oral TID WC   Gerhardt's butt cream   Topical BID   heparin  5,000 Units Subcutaneous Q8H   isosorbide dinitrate  5 mg Oral BID   leptospermum manuka honey  1 Application Topical Daily   melatonin  4.5 mg Oral QHS   multivitamin  1 tablet Oral QHS   polyethylene glycol  17 g Oral Daily   senna-docusate  1 tablet Oral BID   sodium chloride flush  3 mL Intravenous Q12H    Dialysis Orders: [facility] MWF 4h  400/800  84.5kg  2/2.5 bath   Heparin 4000 units IV  [facility] TDC - last OP HD 12/20, post wt 85.9kg - last IP hD 12/24, post wt 85kg - mircera 225 mcg IV q2, last 12/18, due 1/02 - no vdra  Assessment/Plan: ESRD - on HD MWF. on schedule  /incre. heparin dose 4000 units pre and  mid tx 3000. K+ 5.3 and BUN 93 so will need 4hr tx  ( if available staff). Noted K+ 6.4 yesterday morning  and Lokelma was given. K+ improving-now 4.0. Per patient, he has vertigo and feels his vertigo worsens on HD. Noted his Bps were dropping during yesterday's treatment causing minimal UF. Plan to run even at next HD on 1/15. Chronic cholecystitis - w/ nausea, ( now resolved )  malnutrition, FTT.  S/p laparoscopic cholecystectomy 12/19/2022 Sacral decubitus ulcer =On IV antibiotics per primary Merdis Delay undergoing hydrotherapy, empiric Vanco and Zosyn per admit team sacral MRI "not done as patient has SNS "per admit team, admit team consulted surgery no surgical intervention currently needed/ noted notes now indicating some healing / was getting  NGT feeding  for nutrition to help healing  Chronic HFrEF - LVEF up to 50-55% here, diffuse CAD.  HTN/ volume - BP soft/stable today. Not on any anti-hypertensives. No volume exces UF as tolerated. Anemia esrd -Hgb 8.4. S/p 1 unit PRBC, Aranesp 200 q Friday. Tsat low but infection picture and ferritin high. 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 On regular diet because of FTT/nutrition DM2 - per pmd   Nutrition -a.m. albumin low 1.9 PCM, FTT. On regular diet + protein supp. Coretrak placed for nutrition nutrition per admit plus per RD following Medication -on admit was on gabapentin dose inappropriate for ESRD pt. -Unclear indication -will d/c for now. Can resume at 300 mg q day if needed.   [name] Poet, NP [facility] 01/04/2023,10:46 AM  LOS: 22 days

## 2023-01-05 DIAGNOSIS — K811 Chronic cholecystitis: Secondary | ICD-10-CM | POA: Diagnosis not present

## 2023-01-05 LAB — CBC WITH DIFFERENTIAL/PLATELET
Abs Immature Granulocytes: 0.04 10*3/uL (ref 0.00–0.07)
Basophils Absolute: 0.1 10*3/uL (ref 0.0–0.1)
Basophils Relative: 1 %
Eosinophils Absolute: 0.9 10*3/uL — ABNORMAL HIGH (ref 0.0–0.5)
Eosinophils Relative: 11 %
HCT: 26.3 % — ABNORMAL LOW (ref 39.0–52.0)
Hemoglobin: 8.3 g/dL — ABNORMAL LOW (ref 13.0–17.0)
Immature Granulocytes: 1 %
Lymphocytes Relative: 26 %
Lymphs Abs: 2.3 10*3/uL (ref 0.7–4.0)
MCH: 31.7 pg (ref 26.0–34.0)
MCHC: 31.6 g/dL (ref 30.0–36.0)
MCV: 100.4 fL — ABNORMAL HIGH (ref 80.0–100.0)
Monocytes Absolute: 0.9 10*3/uL (ref 0.1–1.0)
Monocytes Relative: 11 %
Neutro Abs: 4.6 10*3/uL (ref 1.7–7.7)
Neutrophils Relative %: 50 %
Platelets: 372 10*3/uL (ref 150–400)
RBC: 2.62 MIL/uL — ABNORMAL LOW (ref 4.22–5.81)
RDW: 18.9 % — ABNORMAL HIGH (ref 11.5–15.5)
WBC: 8.8 10*3/uL (ref 4.0–10.5)
nRBC: 0 % (ref 0.0–0.2)

## 2023-01-05 LAB — GLUCOSE, CAPILLARY
Glucose-Capillary: 111 mg/dL — ABNORMAL HIGH (ref 70–99)
Glucose-Capillary: 124 mg/dL — ABNORMAL HIGH (ref 70–99)
Glucose-Capillary: 128 mg/dL — ABNORMAL HIGH (ref 70–99)
Glucose-Capillary: 131 mg/dL — ABNORMAL HIGH (ref 70–99)
Glucose-Capillary: 97 mg/dL (ref 70–99)
Glucose-Capillary: 99 mg/dL (ref 70–99)

## 2023-01-05 LAB — RENAL FUNCTION PANEL
Albumin: 2 g/dL — ABNORMAL LOW (ref 3.5–5.0)
Anion gap: 11 (ref 5–15)
BUN: 49 mg/dL — ABNORMAL HIGH (ref 8–23)
CO2: 26 mmol/L (ref 22–32)
Calcium: 9.7 mg/dL (ref 8.9–10.3)
Chloride: 99 mmol/L (ref 98–111)
Creatinine, Ser: 3.47 mg/dL — ABNORMAL HIGH (ref 0.61–1.24)
GFR, Estimated: 17 mL/min — ABNORMAL LOW (ref >60)
Glucose, Bld: 114 mg/dL — ABNORMAL HIGH (ref 70–99)
Phosphorus: 5.1 mg/dL — ABNORMAL HIGH (ref 2.5–4.6)
Potassium: 3.9 mmol/L (ref 3.5–5.1)
Sodium: 136 mmol/L (ref 135–145)

## 2023-01-05 MED ORDER — LIDOCAINE HCL (PF) 1 % IJ SOLN: 5.0000 mL | INTRAMUSCULAR | Status: DC | PRN

## 2023-01-05 MED ORDER — ALTEPLASE 2 MG IJ SOLR: 2.0000 mg | Freq: Once | INTRAMUSCULAR | Status: DC | PRN

## 2023-01-05 MED ORDER — PENTAFLUOROPROP-TETRAFLUOROETH EX AERO: 1.0000 | INHALATION_SPRAY | CUTANEOUS | Status: DC | PRN

## 2023-01-05 MED ORDER — SODIUM CHLORIDE 0.9 % IV SOLN: INTRAVENOUS | Status: DC | PRN

## 2023-01-05 MED ORDER — LIDOCAINE-PRILOCAINE 2.5-2.5 % EX CREA: 1.0000 | TOPICAL_CREAM | CUTANEOUS | Status: DC | PRN

## 2023-01-05 NOTE — TOC Progression Note (Signed)
Transition of Care [facility]) - Progression Note    Patient Details  Name: [name] MRN: [id number] Date of Birth: 1946/05/09  Transition of Care [facility]) CM/SW [place], [place] Phone Number: 01/05/2023, 11:45 AM  Clinical Narrative:     CSW following to start insurance authorization closer to patient being medically ready for dc. Patient has SNF bed at [facility]. CSW will continue to follow and assist with patients dc planning needs.   Expected Discharge Plan: [place] Barriers to Discharge: Continued Medical Work up  Expected Discharge Plan and Services In-house Referral: Clinical Social Work   Post Acute Care Choice: [place] Living arrangements for the past 2 months: [name] Determinants of Health (SDOH) Interventions SDOH Screenings   Food Insecurity: No Food Insecurity (01/05/2023)  Housing: Low Risk  (12/17/2022)  Transportation Needs: No Transportation Needs (12/17/2022)  Utilities: Not At Risk (12/17/2022)  Tobacco Use: Medium Risk (12/20/2022)    Readmission Risk Interventions    09/16/2022   12:31 PM 08/02/2022    2:23 PM  Readmission Risk Prevention Plan  Transportation Screening Complete Complete  Medication Review (RN Care Manager) Complete Complete  HRI or Home Care Consult Complete Complete  SW Recovery Care/Counseling Consult Complete Complete  Palliative Care Screening Not Applicable Not Applicable  Skilled Nursing Facility Not Applicable Complete

## 2023-01-05 NOTE — Progress Notes (Signed)
PROGRESS NOTE    [name]  DGL:[id number] DOB: 1946/10/02 DOA: 12/12/2022 PCP: Redmond School, MD  No chief complaint on file.   Brief Narrative:  77 year old male with past medical history of ESRD on hemodialysis Monday Wednesday Friday, HFrEF, diabetes mellitus type 2, hypertension who presented with nausea. Patient has known history of cholecystitis and underwent cholecystostomy tube placement. His tube had become dislodged and he underwent evaluation with surgery. He was noted to have recanalization of the bile duct therefore tube was not replaced. He came to hospital for elective cholecystectomy underwent preop evaluation with cardiology. His workup was notable for diffuse T wave inversions and prolonged QTc. Also was found to be significantly hypokalemic. He was started on electrolyte replacement. Nephrology was consulted for chronic hemodialysis during hospitalization. He was transferred to [facility] for cath and noted to have multivessel diffuse moderate CAD without obvious flow limiting lesion.  He was cleared for surgery and is now post cholecystecomy on 12/28.  Hospitalization c/b presence of sacral decubitus ulcer.  S/p hydrotherapy.  Currently on antibiotics.  Issue now is poor PO intake.  Had NG tube in place briefly.  Watching PO intake at this time to make decision regarding pursuing possible PEG.    Assessment & Plan:   Principal Problem:   Chronic cholecystitis Active Problems:   ESRD (end stage renal disease) ([place])   Coronary artery disease involving native coronary artery of native heart without angina pectoris   Hypokalemia   Diabetes mellitus type 2 in obese (HCC)   Anemia, normocytic normochromic   Essential hypertension   Heart failure with improved ejection fraction (HFimpEF) (HCC)   HFrEF (heart failure with reduced ejection fraction) (HCC)   Nausea and vomiting   Pressure injury of skin of sacral region   Prolonged QT interval   Generalized weakness    Abnormal EKG   Preoperative cardiovascular examination   Protein-calorie malnutrition, severe   Malnutrition of moderate degree   Paroxysmal atrial fibrillation (HCC)  Chronic cholecystitis -Presented with malnutrition, failure to thrive -General surgery was consulted, underwent cholecystectomy on 12/28 with intraoperative cholangiogram  -Per general surgery; pneumoperitoneum is likely from lap chole done last week -No further intervention recommended   Chronic HFrEF, abnormal ECG -Repeat echo during this admission showed EF of 50 to 55% and 35 to 40% in August 2023 -Continue cardiac monitoring -Left heart cath on 12/26 showed multivessel diffuse moderate CAD with no acute or obvious flow-limiting lesions(in absence of any active anginal symptoms, would not PCI in preop setting)  -Medical management recommended, okayed to proceed with surgery as per cardiology -Started on high-dose statin   ESRD -Nephrology consulted for hemodialysis   Concern for Afib -none noted per cards, will continue to monitor   Hyperkalemia -improved   Anemia of ESRD -ESA per nephrology -S/p 2 unit PRBC during this hospitalization -Hemoglobin stable at 8.3 this morning   Unstageable sacral ulcer -wound culture growing e. Coli resistant to ampicillin/unasyn, e faecalis resistant to vancomycin, pansensitive proteus  -completed course of hydrotherapy -CT with subcutaneous fat straining with complex collection posterior to the sacrum on the left measuring 2.5x1.2cm concerning for abscess, no evidence of osteo - vancomycin 1/3 - 1/5 - cefepime 1/3 - 1/9 - flagyl 1/3-1/9  - ampicillin 1/8 - present  - E. Coli and proteus treated x 7 days with cefepime.  E. Faecalis currently being treated with ampicillin.  Plan to complete on 1/15. -sacral MRI ordered, not done as patient has SNS -per surgery, no  abscess but packing in the place of ulcer. -Patient has been getting hydrotherapy for past 1 week -1/9 woc  recommending stop hydrotherapy/dakins -> medihoney to inner wound and outer black wound q day, fill with gauze, cover with foam, change foam q3 days or prn soiling -Continue oxycodone 5 mg every 4 hours as needed -Continue local wound care as per wound care RN recommendation   Diabetes mellitus type 2 A1c 4.4 (likely reflecting his recent failure to thrive, poor PO intake) D/c SSI   Functional Quadriplegia  Essentially bedbound for the past year (needs help toileting, turning in bed, dressing, etc) -Unclear cause Follow outpatient   Hypophosphatemia -Replete   Hypomagnesemia -Replete   Moderate Protein calorie malnutrition  Poor PO intake -BMI 22.98 kg/m -had cortrak to supplement nutrition in setting of his decubitus ulcer, but this was d/c'd after about 1 week  -per palliative care discussion, agreeable to PEG, but will discuss further with him and family as well as watch his PO intake     DVT prophylaxis: heparin Code Status: full Family Communication: none Disposition:   Status is: Inpatient Remains inpatient appropriate because: continued poor PO intake, IV abx   Consultants:    Procedures:  LHC  Prox RCA-1 lesion is 50% stenosed. Prox RCA-2 lesion is 20% stenosed.   Mid RCA to Dist RCA lesion is 65% stenosed.   RPDA lesion is 60% stenosed.   RPAV lesion is 15% stenosed.  2nd RPL lesion is 30% stenosed.   Dist LAD-1 lesion is 55% stenosed. Dist/apical LAD-2 lesion is 75% stenosed.   Dist/apical LAD-2 lesion is 75% stenosed.   Prox 3rd Mrg is 40% stenosed   LV end diastolic pressure is normal.   POST CATH DIAGNOSES Mulitvessel diffuse Moderate CAD with no acute or obvious Flow-Limiting lesions. Prox RCA eccentril 50-55%, distal RCA eccentric calcified shelf lesion ~60-65% & ostial rPDA 60% --> In tandem could potentialy be RFR/FFR positive. However, in the absence of any ongoing active anginal symptoms, would not PCI in the preop setting. Early distal LAD  eccentric 55 to 60% and apical 70% stenosed this cannot amenable for PCI. Otherwise diffuse mild to moderate disease in the LCx Normal LVEDP-normal EF by Echo      RECOMMENDATIONS Aggressive to direct medical therapy for moderate diffuse CAD.  Monitor for symptoms.  But for now would simply treat medically. Would proceed with planned operation without further cardiac evaluation given nonobstructive CAD and normal echo.   Echo IMPRESSIONS     1. Limited Echo to evaluate LVEF   2. Left ventricular ejection fraction, by estimation, is 50 to 55%. The  left ventricle has low normal function. The left ventricle has no regional  wall motion abnormalities. Left ventricular diastolic function could not  be evaluated.   3. The mitral valve is grossly normal. Mild mitral valve regurgitation.  No evidence of mitral stenosis.   4. The inferior vena cava is normal in size with greater than 50%  respiratory variability, suggesting right atrial pressure of 3 mmHg.   Comparison(s): Changes from prior study are noted. LVEF improved from  35-40% in 07/2022.   Antimicrobials:  Anti-infectives (From admission, onward)    Start     Dose/Rate Route Frequency Ordered Stop   12/30/22 2200  ampicillin (OMNIPEN) 2 g in sodium chloride 0.9 % 100 mL IVPB        2 g 300 mL/hr over 20 Minutes Intravenous Every 12 hours 12/30/22 1519 01/06/23 1559   12/30/22 1145  linezolid (ZYVOX) IVPB 600 mg  Status:  Discontinued        600 mg 300 mL/hr over 60 Minutes Intravenous Every 12 hours 12/30/22 1056 12/30/22 1519   12/28/22 0000  vancomycin (VANCOCIN) IVPB 1000 mg/200 mL premix  Status:  Discontinued        1,000 mg 200 mL/hr over 60 Minutes Intravenous Every M-W-F (Hemodialysis) 12/27/22 1354 12/30/22 1056   12/27/22 1445  vancomycin (VANCOCIN) IVPB 1000 mg/200 mL premix        1,000 mg 200 mL/hr over 60 Minutes Intravenous  Once 12/27/22 1354 12/27/22 1738   12/27/22 1200  vancomycin (VANCOCIN) IVPB 1000  mg/200 mL premix  Status:  Discontinued        1,000 mg 200 mL/hr over 60 Minutes Intravenous Every M-W-F (Hemodialysis) 12/25/22 1831 12/27/22 1354   12/26/22 2100  ceFEPIme (MAXIPIME) 1 g in sodium chloride 0.9 % 100 mL IVPB        1 g 200 mL/hr over 30 Minutes Intravenous Every 24 hours 12/26/22 1052 12/31/22 2034   12/25/22 1900  ceFEPIme (MAXIPIME) 1 g in sodium chloride 0.9 % 100 mL IVPB  Status:  Discontinued        1 g 200 mL/hr over 30 Minutes Intravenous Every 24 hours 12/25/22 1812 12/26/22 1052   12/25/22 1900  vancomycin (VANCOREADY) IVPB 1750 mg/350 mL        1,750 mg 175 mL/hr over 120 Minutes Intravenous  Once 12/25/22 1812 12/26/22 0146   12/25/22 1900  metroNIDAZOLE (FLAGYL) IVPB 500 mg        500 mg 100 mL/hr over 60 Minutes Intravenous Every 12 hours 12/25/22 1812 01/01/23 0700   12/13/22 0600  cefoTEtan (CEFOTAN) 2 g in sodium chloride 0.9 % 100 mL IVPB  Status:  Discontinued        2 g 200 mL/hr over 30 Minutes Intravenous On call to O.R. 12/12/22 1631 12/14/22 0559   12/12/22 0615  cefoTEtan (CEFOTAN) 2 g in sodium chloride 0.9 % 100 mL IVPB  Status:  Discontinued        2 g 200 mL/hr over 30 Minutes Intravenous On call to O.R. 12/12/22 0614 12/12/22 0913       Subjective: Denies any complaints Going to do his best with eating  Objective: Vitals:   01/05/23 0423 01/05/23 0746 01/05/23 1309 01/05/23 1630  BP: (!) 107/56 97/65 130/60 128/65  Pulse: 81 82 84 68  Resp: '16 16 20 19  '$ Temp: 98.4 F (36.9 C) 98.1 F (36.7 C) 97.9 F (36.6 C) 98.4 F (36.9 C)  TempSrc: Axillary Axillary Oral Oral  SpO2: 99% 100% 100% 92%  Weight: 90 kg     Height:        Intake/Output Summary (Last 24 hours) at 01/05/2023 1924 Last data filed at 01/05/2023 1824 Gross per 24 hour  Intake 985.03 ml  Output --  Net 985.03 ml   Filed Weights   01/03/23 1231 01/04/23 0405 01/05/23 0423  Weight: 90.8 kg 88.5 kg 90 kg    Examination:  General: No acute  distress. Cardiovascular: RRR Lungs: unlabored Abdomen: Soft, nontender, nondistended Neurological: Alert and oriented 3. Moves all extremities 4. Cranial nerves II through XII grossly intact. Skin: decub not evaluated today Extremities: No clubbing or cyanosis. No edema.  Data Reviewed: I have personally reviewed following labs and imaging studies  CBC: Recent Labs  Lab 12/30/22 1614 12/31/22 0617 01/01/23 0130 01/03/23 0812 01/05/23 0158  WBC 12.6* 11.1* 11.6* 10.5 8.8  NEUTROABS  --   --   --   --  4.6  HGB 6.3* 8.4* 7.9* 8.4* 8.3*  HCT 20.4* 26.1* 24.0* 25.9* 26.3*  MCV 102.0* 97.0 97.6 98.5 100.4*  PLT 371 358 364 390 737    Basic Metabolic Panel: Recent Labs  Lab 12/30/22 0156 12/31/22 0617 01/01/23 0130 01/02/23 0131 01/03/23 0301 01/03/23 0812 01/04/23 0125 01/05/23 0158  NA 140   < > 136 134* 133*  --  135 136  K 4.4   < > 5.3* 4.6 6.4* 4.5 4.0 3.9  CL 100   < > 98 97* 96*  --  96* 99  CO2 27   < > '27 27 24  '$ --  29 26  GLUCOSE 171*   < > 159* 149* 86  --  99 114*  BUN 88*   < > 93* 59* 88*  --  36* 49*  CREATININE 4.98*   < > 4.01* 2.47* 3.60*  --  2.43* 3.47*  CALCIUM 9.5   < > 9.4 9.1 9.8  --  9.2 9.7  MG 2.0  --   --   --   --   --   --   --   PHOS 3.1  3.1   < > 2.3* 2.8 4.6  --  3.4 5.1*   < > = values in this interval not displayed.    GFR: Estimated Creatinine Clearance: 21.3 mL/min (Breylin Dom) (by C-G formula based on SCr of 3.47 mg/dL (H)).  Liver Function Tests: Recent Labs  Lab 01/01/23 0130 01/02/23 0131 01/03/23 0301 01/04/23 0125 01/05/23 0158  ALBUMIN 1.9* 1.9* 2.2* 1.9* 2.0*    CBG: Recent Labs  Lab 01/05/23 0008 01/05/23 0420 01/05/23 0745 01/05/23 1219 01/05/23 1629  GLUCAP 99 124* 97 128* 111*     No results found for this or any previous visit (from the past 240 hour(s)).        Radiology Studies: No results found.      Scheduled Meds:  (feeding supplement) PROSource Plus  30 mL Oral BID BM   sodium  chloride   Intravenous Once   allopurinol  100 mg Oral Daily   aspirin EC  81 mg Oral Daily   atorvastatin  40 mg Oral Daily   Chlorhexidine Gluconate Cloth  6 each Topical Q0600   cycloSPORINE  1 drop Both Eyes BID   darbepoetin (ARANESP) injection - DIALYSIS  200 mcg Subcutaneous Q Fri-1800   feeding supplement  237 mL Oral TID WC   Gerhardt's butt cream   Topical BID   heparin  5,000 Units Subcutaneous Q8H   isosorbide dinitrate  5 mg Oral BID   leptospermum manuka honey  1 Application Topical Daily   melatonin  4.5 mg Oral QHS   multivitamin  1 tablet Oral QHS   polyethylene glycol  17 g Oral Daily   senna-docusate  1 tablet Oral BID   sodium chloride flush  3 mL Intravenous Q12H   Continuous Infusions:  sodium chloride 10 mL/hr at 01/05/23 1824   ampicillin (OMNIPEN) IV Stopped (01/05/23 1704)     LOS: 23 days    Time spent: over 30 min    Fayrene Helper, MD [facility]   To contact the attending provider between 7A-7P or the covering provider during after hours 7P-7A, please log into the web site [email or web address] and access using universal [place] password for that web site. If you do not have the password, please call the hospital  operator.  01/05/2023, 7:24 PM

## 2023-01-05 NOTE — Progress Notes (Signed)
Notified by CCMD Pt had 5 run beat of unsustained Vtach. Pt asymptomatic. Will continue to monitor.

## 2023-01-05 NOTE — Progress Notes (Signed)
[facility] Progress Note   Subjective:    Seen and examined patient at bedside. No acute complaints now but noted he had 5-beat run V tach overnight-appears he was asymptomatic. Will run HD even tomorrow. Noted Palliative Care was consulted.  Objective Vitals:   01/04/23 2041 01/05/23 0009 01/05/23 0423 01/05/23 0746  BP: 109/73 126/69 (!) 107/56 97/65  Pulse: 87 92 81 82  Resp: '20 18 16 16  '$ Temp: 97.9 F (36.6 C) 97.7 F (36.5 C) 98.4 F (36.9 C) 98.1 F (36.7 C)  TempSrc: Oral Oral Axillary Axillary  SpO2: 100% 98% 99% 100%  Weight:   90 kg   Height:       Physical Exam General: Chronically ill adult male, NAD  Heart: RRR no MRG Lungs: CTA bilaterally nonlabored breathing Abdomen: NABS soft NTND, NG tube present Extremities: No lower extremity edema  dialysis Access: Right IJ [facility]   Filed Weights   01/03/23 1231 01/04/23 0405 01/05/23 0423  Weight: 90.8 kg 88.5 kg 90 kg    Intake/Output Summary (Last 24 hours) at 01/05/2023 0909 Last data filed at 01/05/2023 0154 Gross per 24 hour  Intake 411.49 ml  Output --  Net 411.49 ml    Additional Objective Labs: Basic Metabolic Panel: Recent Labs  Lab 01/03/23 0301 01/03/23 0812 01/04/23 0125 01/05/23 0158  NA 133*  --  135 136  K 6.4* 4.5 4.0 3.9  CL 96*  --  96* 99  CO2 24  --  29 26  GLUCOSE 86  --  99 114*  BUN 88*  --  36* 49*  CREATININE 3.60*  --  2.43* 3.47*  CALCIUM 9.8  --  9.2 9.7  PHOS 4.6  --  3.4 5.1*   Liver Function Tests: Recent Labs  Lab 01/03/23 0301 01/04/23 0125 01/05/23 0158  ALBUMIN 2.2* 1.9* 2.0*   No results for input(s): "LIPASE", "AMYLASE" in the last 168 hours. CBC: Recent Labs  Lab 12/30/22 1614 12/31/22 0617 01/01/23 0130 01/03/23 0812 01/05/23 0158  WBC 12.6* 11.1* 11.6* 10.5 8.8  NEUTROABS  --   --   --   --  4.6  HGB 6.3* 8.4* 7.9* 8.4* 8.3*  HCT 20.4* 26.1* 24.0* 25.9* 26.3*  MCV 102.0* 97.0 97.6 98.5 100.4*  PLT 371 358 364 390 372   Blood  Culture    Component Value Date/Time   SDES WOUND 12/25/2022 1715   SPECREQUEST NONE 12/25/2022 1715   CULT  12/25/2022 1715    FEW ESCHERICHIA COLI ABUNDANT PROTEUS MIRABILIS ABUNDANT ENTEROCOCCUS FAECALIS VANCOMYCIN RESISTANT ENTEROCOCCUS    REPTSTATUS 12/29/2022 FINAL 12/25/2022 1715    Cardiac Enzymes: No results for input(s): "CKTOTAL", "CKMB", "CKMBINDEX", "TROPONINI" in the last 168 hours. CBG: Recent Labs  Lab 01/04/23 1646 01/04/23 2038 01/05/23 0008 01/05/23 0420 01/05/23 0745  GLUCAP 138* 91 99 124* 97   Iron Studies: No results for input(s): "IRON", "TIBC", "TRANSFERRIN", "FERRITIN" in the last 72 hours. Lab Results  Component Value Date   INR 1.1 09/13/2022   INR 1.5 (H) 08/20/2022   INR 1.6 (H) 08/19/2022   Studies/Results: No results found.  Medications:  ampicillin (OMNIPEN) IV 2 g (01/05/23 0420)    (feeding supplement) PROSource Plus  30 mL Oral BID BM   sodium chloride   Intravenous Once   allopurinol  100 mg Oral Daily   aspirin EC  81 mg Oral Daily   atorvastatin  40 mg Oral Daily   Chlorhexidine Gluconate Cloth  6 each Topical  Q0600   cycloSPORINE  1 drop Both Eyes BID   darbepoetin (ARANESP) injection - DIALYSIS  200 mcg Subcutaneous Q Fri-1800   feeding supplement  237 mL Oral TID WC   Gerhardt's butt cream   Topical BID   heparin  5,000 Units Subcutaneous Q8H   isosorbide dinitrate  5 mg Oral BID   leptospermum manuka honey  1 Application Topical Daily   melatonin  4.5 mg Oral QHS   multivitamin  1 tablet Oral QHS   polyethylene glycol  17 g Oral Daily   senna-docusate  1 tablet Oral BID   sodium chloride flush  3 mL Intravenous Q12H    Dialysis Orders: [facility] MWF 4h  400/800  84.5kg  2/2.5 bath   Heparin 4000 units IV  [facility] - last OP HD 12/20, post wt 85.9kg - last IP hD 12/24, post wt 85kg - mircera 225 mcg IV q2, last 12/18, due 1/02 - no vdra  Assessment/Plan: ESRD - on HD MWF. on schedule  /incre. heparin dose 4000  units pre and  mid tx 3000. K+ 5.3 and BUN 93 so will need 4hr tx  ( if available staff). Was hyperkalemic earlier this week but now improving. He reports history of vertigo and how it worsens on HD. Bps remain soft/stable. Plan to run even at next HD on 1/15. Chronic cholecystitis - w/ nausea, ( now resolved )  malnutrition, FTT.  S/p laparoscopic cholecystectomy 12/19/2022 Sacral decubitus ulcer =On IV antibiotics per primary Merdis Delay undergoing hydrotherapy, empiric Vanco and Zosyn per admit team sacral MRI "not done as patient has SNS "per admit team, admit team consulted surgery no surgical intervention currently needed/ noted notes now indicating some healing / was getting  NGT feeding  for nutrition to help healing  Chronic HFrEF - LVEF up to 50-55% here, diffuse CAD.  HTN/ volume - BP remain soft/stable. Not on any anti-hypertensives. No volume exces UF as tolerated. Anemia esrd -Hgb 8.3. S/p 1 unit PRBC, Aranesp 200 q Friday. Tsat low but infection picture and ferritin high. 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 On regular diet because of FTT/nutrition DM2 - per pmd   Nutrition -a.m. albumin low 1.9 PCM, FTT. On regular diet + protein supp. Coretrak placed for nutrition nutrition per admit plus per RD following Medication -on admit was on gabapentin dose inappropriate for ESRD pt. -Unclear indication -will d/c for now. Can resume at 300 mg q day if needed.  Dispo - noted Palliative Care was consulted yesterday. Per note, continue full scope care. Appreciate their assistance.  Tobie Poet, NP [facility] 01/05/2023,9:09 AM  LOS: 23 days

## 2023-01-06 DIAGNOSIS — K811 Chronic cholecystitis: Secondary | ICD-10-CM | POA: Diagnosis not present

## 2023-01-06 LAB — CBC WITH DIFFERENTIAL/PLATELET
Abs Immature Granulocytes: 0.03 10*3/uL (ref 0.00–0.07)
Basophils Absolute: 0.1 10*3/uL (ref 0.0–0.1)
Basophils Relative: 1 %
Eosinophils Absolute: 0.9 10*3/uL — ABNORMAL HIGH (ref 0.0–0.5)
Eosinophils Relative: 10 %
HCT: 28.1 % — ABNORMAL LOW (ref 39.0–52.0)
Hemoglobin: 8.9 g/dL — ABNORMAL LOW (ref 13.0–17.0)
Immature Granulocytes: 0 %
Lymphocytes Relative: 21 %
Lymphs Abs: 2 10*3/uL (ref 0.7–4.0)
MCH: 32.2 pg (ref 26.0–34.0)
MCHC: 31.7 g/dL (ref 30.0–36.0)
MCV: 101.8 fL — ABNORMAL HIGH (ref 80.0–100.0)
Monocytes Absolute: 0.9 10*3/uL (ref 0.1–1.0)
Monocytes Relative: 10 %
Neutro Abs: 5.4 10*3/uL (ref 1.7–7.7)
Neutrophils Relative %: 58 %
Platelets: 362 10*3/uL (ref 150–400)
RBC: 2.76 MIL/uL — ABNORMAL LOW (ref 4.22–5.81)
RDW: 19.5 % — ABNORMAL HIGH (ref 11.5–15.5)
WBC: 9.3 10*3/uL (ref 4.0–10.5)
nRBC: 0 % (ref 0.0–0.2)

## 2023-01-06 LAB — RENAL FUNCTION PANEL
Albumin: 1.9 g/dL — ABNORMAL LOW (ref 3.5–5.0)
Anion gap: 14 (ref 5–15)
BUN: 58 mg/dL — ABNORMAL HIGH (ref 8–23)
CO2: 26 mmol/L (ref 22–32)
Calcium: 9.7 mg/dL (ref 8.9–10.3)
Chloride: 98 mmol/L (ref 98–111)
Creatinine, Ser: 4.43 mg/dL — ABNORMAL HIGH (ref 0.61–1.24)
GFR, Estimated: 13 mL/min — ABNORMAL LOW (ref >60)
Glucose, Bld: 93 mg/dL (ref 70–99)
Phosphorus: 5.8 mg/dL — ABNORMAL HIGH (ref 2.5–4.6)
Potassium: 4.5 mmol/L (ref 3.5–5.1)
Sodium: 138 mmol/L (ref 135–145)

## 2023-01-06 LAB — GLUCOSE, CAPILLARY
Glucose-Capillary: 100 mg/dL — ABNORMAL HIGH (ref 70–99)
Glucose-Capillary: 126 mg/dL — ABNORMAL HIGH (ref 70–99)
Glucose-Capillary: 132 mg/dL — ABNORMAL HIGH (ref 70–99)
Glucose-Capillary: 85 mg/dL (ref 70–99)
Glucose-Capillary: 88 mg/dL (ref 70–99)
Glucose-Capillary: 90 mg/dL (ref 70–99)

## 2023-01-06 MED ORDER — HEPARIN SODIUM (PORCINE) 1000 UNIT/ML IJ SOLN
3000.0000 [IU] | Freq: Once | INTRAMUSCULAR | Status: AC
  Administered 2023-01-06: 3000 [IU] via INTRAVENOUS
  Filled 2023-01-06 (×2): qty 3

## 2023-01-06 MED ORDER — HEPARIN SODIUM (PORCINE) 1000 UNIT/ML IJ SOLN
4000.0000 [IU] | Freq: Once | INTRAMUSCULAR | Status: AC
  Administered 2023-01-06: 4000 [IU] via INTRAVENOUS

## 2023-01-06 NOTE — Progress Notes (Signed)
[facility] Progress Note   Subjective:    Seen in room. Says he been having bad "vertigo" during dialysis. Dizzy during treatment, but improves after. Keep even with dialysis today -no fluid removal   Objective Vitals:   01/05/23 1951 01/06/23 0021 01/06/23 0613 01/06/23 0752  BP: 100/66 129/64 136/66 123/63  Pulse: 86 77 80 79  Resp: '16 18 17 17  '$ Temp: 98.1 F (36.7 C) 98.4 F (36.9 C) 98.3 F (36.8 C) 98.4 F (36.9 C)  TempSrc: Oral Oral Oral Oral  SpO2: 92% 99% 99% 97%  Weight:   92.5 kg   Height:       Physical Exam General: Elderly man, frail, nad  Heart: RRR no MRG Lungs: Clear bilaterally,  nonlabored breathing Abdomen: soft non tender  Extremities: No lower extremity edema  Access: Right IJ [facility]   Filed Weights   01/04/23 0405 01/05/23 0423 01/06/23 0613  Weight: 88.5 kg 90 kg 92.5 kg    Intake/Output Summary (Last 24 hours) at 01/06/2023 0904 Last data filed at 01/06/2023 6644 Gross per 24 hour  Intake 617.07 ml  Output 350 ml  Net 267.07 ml     Additional Objective Labs: Basic Metabolic Panel: Recent Labs  Lab 01/04/23 0125 01/05/23 0158 01/06/23 0440  NA 135 136 138  K 4.0 3.9 4.5  CL 96* 99 98  CO2 '29 26 26  '$ GLUCOSE 99 114* 93  BUN 36* 49* 58*  CREATININE 2.43* 3.47* 4.43*  CALCIUM 9.2 9.7 9.7  PHOS 3.4 5.1* 5.8*    Liver Function Tests: Recent Labs  Lab 01/04/23 0125 01/05/23 0158 01/06/23 0440  ALBUMIN 1.9* 2.0* 1.9*    No results for input(s): "LIPASE", "AMYLASE" in the last 168 hours. CBC: Recent Labs  Lab 12/31/22 0617 01/01/23 0130 01/03/23 0812 01/05/23 0158 01/06/23 0223  WBC 11.1* 11.6* 10.5 8.8 9.3  NEUTROABS  --   --   --  4.6 5.4  HGB 8.4* 7.9* 8.4* 8.3* 8.9*  HCT 26.1* 24.0* 25.9* 26.3* 28.1*  MCV 97.0 97.6 98.5 100.4* 101.8*  PLT 358 364 390 372 362    Blood Culture    Component Value Date/Time   SDES WOUND 12/25/2022 1715   SPECREQUEST NONE 12/25/2022 1715   CULT  12/25/2022 1715     FEW ESCHERICHIA COLI ABUNDANT PROTEUS MIRABILIS ABUNDANT ENTEROCOCCUS FAECALIS VANCOMYCIN RESISTANT ENTEROCOCCUS    REPTSTATUS 12/29/2022 FINAL 12/25/2022 1715    Cardiac Enzymes: No results for input(s): "CKTOTAL", "CKMB", "CKMBINDEX", "TROPONINI" in the last 168 hours. CBG: Recent Labs  Lab 01/05/23 1629 01/05/23 1949 01/06/23 0019 01/06/23 0439 01/06/23 0751  GLUCAP 111* 131* 88 85 90    Iron Studies: No results for input(s): "IRON", "TIBC", "TRANSFERRIN", "FERRITIN" in the last 72 hours. Lab Results  Component Value Date   INR 1.1 09/13/2022   INR 1.5 (H) 08/20/2022   INR 1.6 (H) 08/19/2022   Studies/Results: No results found.  Medications:  sodium chloride Stopped (01/06/23 0425)    (feeding supplement) PROSource Plus  30 mL Oral BID BM   sodium chloride   Intravenous Once   allopurinol  100 mg Oral Daily   aspirin EC  81 mg Oral Daily   atorvastatin  40 mg Oral Daily   Chlorhexidine Gluconate Cloth  6 each Topical Q0600   cycloSPORINE  1 drop Both Eyes BID   darbepoetin (ARANESP) injection - DIALYSIS  200 mcg Subcutaneous Q Fri-1800   feeding supplement  237 mL Oral TID WC  Gerhardt's butt cream   Topical BID   heparin  5,000 Units Subcutaneous Q8H   isosorbide dinitrate  5 mg Oral BID   leptospermum manuka honey  1 Application Topical Daily   melatonin  4.5 mg Oral QHS   multivitamin  1 tablet Oral QHS   polyethylene glycol  17 g Oral Daily   senna-docusate  1 tablet Oral BID   sodium chloride flush  3 mL Intravenous Q12H    Dialysis Orders: [facility] MWF 4h  400/800  84.5kg  2/2.5 bath   Heparin 4000 units  + 3000 mid treatment  [facility] - last OP HD 12/20, post wt 85.9kg - last IP hD 12/24, post wt 85kg - mircera 225 mcg IV q2, last 12/18, due 1/02 - no vdra  Assessment/Plan: ESRD - on HD MWF . He reports history of vertigo and how it worsens on HD. Plan to run even at next HD on 1/15. Chronic cholecystitis - w/ nausea, ( now resolved )   malnutrition, FTT.  S/p laparoscopic cholecystectomy 12/19/2022. No further intervention planned Sacral decubitus ulcer - Per primary. Completed hydrotherapy/course of antibiotics. No surgical intervention currently needed Chronic HFrEF - LVEF up to 50-55% here, diffuse CAD.  HTN/ volume - BP remain soft/stable. Not on any anti-hypertensives. No volume excess. UF as tolerated. 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 Tsat low but infection picture and ferritin high. 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 No binder or VDRA currently. On regular diet because of FTT/nutrition DM2 - per pmd   Nutrition -a.m. albumin low 1.9 PCM, FTT. On regular diet + protein supp per RD. To get PEG ? [place]- Palliative Care was consulted --Full code/full scope of care.  Dispo - For SNF   [facility] 01/06/2023,9:04 AM

## 2023-01-06 NOTE — Progress Notes (Signed)
[facility] PROGRESS NOTE  [name] (DOB: [birth year]) MBE:[id number] PCP: Redmond School, MD  Brief Narrative: 77 year old male with past medical history of ESRD on hemodialysis Monday Wednesday Friday, HFrEF, diabetes mellitus type 2, hypertension who presented with nausea. Patient has known history of cholecystitis and underwent cholecystostomy tube placement. His tube had become dislodged and he underwent evaluation with surgery. He was noted to have recanalization of the bile duct therefore tube was not replaced. He came to hospital for elective cholecystectomy underwent preop evaluation with cardiology. His workup was notable for diffuse T wave inversions and prolonged QTc. Also was found to be significantly hypokalemic. He was started on electrolyte replacement. Nephrology was consulted for chronic hemodialysis during hospitalization. He was transferred to [facility] for cath and noted to have multivessel diffuse moderate CAD without obvious flow limiting lesion.  He was cleared for surgery and is now post cholecystecomy on 12/28.  Hospitalization c/b presence of sacral decubitus ulcer.  S/p hydrotherapy.  Currently on antibiotics.  Issue now is poor PO intake.  Had NG tube in place briefly.  Watching PO intake at this time to make decision regarding pursuing possible PEG.     Subjective: Says he ate a protein shake, 2 eggs and bacon this morning, doing better. No nausea, vomiting.   Objective: BP 115/67 (BP Location: Left Arm)   Pulse 93   Temp 97.8 F (36.6 C) (Oral)   Resp 19   Ht '6\' 3"'$  (1.905 m)   Wt 93 kg   SpO2 100%   BMI 25.62 kg/m   Gen: Frail, no distress Pulm: Clear, nonlabored  CV: RRR, no MRG GI: Soft, NT, ND, +BS  Neuro: Alert and oriented. No new focal deficits. Ext: Warm, no deformities Skin: No acute rashes, lesions or ulcers on visualized skin   Assessment & Plan: Chronic cholecystitis -Presented with malnutrition, failure to thrive -General surgery was  consulted, underwent cholecystectomy on 12/28 with intraoperative cholangiogram  -Per general surgery; pneumoperitoneum is likely from lap chole done last week -No further intervention recommended   Chronic HFrEF, abnormal ECG -Repeat echo during this admission showed EF of 50 to 55% and 35 to 40% in August 2023 -Continue cardiac monitoring -Left heart cath on 12/26 showed multivessel diffuse moderate CAD with no acute or obvious flow-limiting lesions(in absence of any active anginal symptoms, would not PCI in preop setting)  -Medical management recommended, okayed to proceed with surgery as per cardiology -Started on high-dose statin   ESRD -Nephrology consulted for hemodialysis   Concern for Afib -none noted per cards, will continue to monitor   Hyperkalemia -improved   Anemia of ESRD -ESA per nephrology -S/p 2 unit PRBC during this hospitalization -Hemoglobin stable at 8.3 this morning   Unstageable sacral ulcer -wound culture growing e. Coli resistant to ampicillin/unasyn, e faecalis resistant to vancomycin, pansensitive proteus  -completed course of hydrotherapy -CT with subcutaneous fat straining with complex collection posterior to the sacrum on the left measuring 2.5x1.2cm concerning for abscess, no evidence of osteo - vancomycin 1/3 - 1/5 - cefepime 1/3 - 1/9. E. Coli and proteus treated x 7 days with cefepime.   - flagyl 1/3-1/9  - ampicillin 1/8 - 1/15. E. Faecalis treated with ampicillin complete 1/15.   -sacral MRI ordered, not done as patient has SNS -per surgery, no abscess but packing in the place of ulcer. -Patient has been getting hydrotherapy for past 1 week -1/9 woc recommending stop hydrotherapy/dakins -> medihoney to inner wound and outer black wound  q day, fill with gauze, cover with foam, change foam q3 days or prn soiling -Continue oxycodone 5 mg every 4 hours as needed -Continue local wound care as per wound care RN recommendation   Diabetes mellitus  type 2 A1c 4.4 (likely reflecting his recent failure to thrive, poor PO intake) D/c SSI   Functional Quadriplegia  Essentially bedbound for the past year (needs help toileting, turning in bed, dressing, etc) -Unclear cause Follow outpatient   Hypophosphatemia -Replete   Hypomagnesemia -Replete   Moderate Protein calorie malnutrition  Poor PO intake -BMI 22.98 kg/m -had cortrak to supplement nutrition in setting of his decubitus ulcer, but this was d/c'd after about 1 week  -per palliative care discussion, agreeable to PEG, but will discuss further with him and family as well as watch his PO intake - Start calorie count.  Patrecia Pour, MD [facility] [email or web address] 01/06/2023, 5:48 PM

## 2023-01-06 NOTE — Procedures (Signed)
HD Note:  Some information was entered later than the data was gathered due to patient care needs. The stated time with the data is accurate.  Received patient in bed to unit.  Alert and oriented to person. Unable to assess further as patient is not interacting most of the time. Informed consent signed and in chart.    Patient tolerated well.  Transported back to the room  Alert, without acute distress.  Hand-off given to patient's nurse.   Access used: Right chest HD Cath Access issues: Catheter was not functioning optimally.  The BFR had to be reduced at the beginning of treatment to 300,  Patient was kept even with fluid.  No UF    [facility]

## 2023-01-06 NOTE — TOC Progression Note (Signed)
Transition of Care [facility]) - Progression Note    Patient Details  Name: [name] MRN: [id number] Date of Birth: March 15, 1946  Transition of Care [facility]) CM/SW [place], [place] Phone Number: 01/06/2023, 9:51 AM  Clinical Narrative:     Patient has SNF bed at [facility]. CSW following to start insurance authorization close to patient being medically ready for dc. CSW will continue to follow and assist with patients dc planning needs.  Expected Discharge Plan: [place] Barriers to Discharge: Continued Medical Work up  Expected Discharge Plan and Services In-house Referral: Clinical Social Work   Post Acute Care Choice: [place] Living arrangements for the past 2 months: [place] Determinants of Health (SDOH) Interventions SDOH Screenings   Food Insecurity: No Food Insecurity (01/05/2023)  Housing: Low Risk  (12/17/2022)  Transportation Needs: No Transportation Needs (12/17/2022)  Utilities: Not At Risk (12/17/2022)  Tobacco Use: Medium Risk (12/20/2022)    Readmission Risk Interventions    09/16/2022   12:31 PM 08/02/2022    2:23 PM  Readmission Risk Prevention Plan  Transportation Screening Complete Complete  Medication Review (RN Care Manager) Complete Complete  HRI or Home Care Consult Complete Complete  SW Recovery Care/Counseling Consult Complete Complete  Palliative Care Screening Not Applicable Not Applicable  Skilled Nursing Facility Not Applicable Complete

## 2023-01-07 DIAGNOSIS — Z7189 Other specified counseling: Secondary | ICD-10-CM | POA: Diagnosis not present

## 2023-01-07 DIAGNOSIS — Z515 Encounter for palliative care: Secondary | ICD-10-CM | POA: Diagnosis not present

## 2023-01-07 DIAGNOSIS — K811 Chronic cholecystitis: Secondary | ICD-10-CM | POA: Diagnosis not present

## 2023-01-07 DIAGNOSIS — R110 Nausea: Secondary | ICD-10-CM

## 2023-01-07 DIAGNOSIS — L89150 Pressure ulcer of sacral region, unstageable: Secondary | ICD-10-CM | POA: Diagnosis not present

## 2023-01-07 LAB — RENAL FUNCTION PANEL
Albumin: 1.9 g/dL — ABNORMAL LOW (ref 3.5–5.0)
Anion gap: 10 (ref 5–15)
BUN: 28 mg/dL — ABNORMAL HIGH (ref 8–23)
CO2: 27 mmol/L (ref 22–32)
Calcium: 9 mg/dL (ref 8.9–10.3)
Chloride: 97 mmol/L — ABNORMAL LOW (ref 98–111)
Creatinine, Ser: 2.45 mg/dL — ABNORMAL HIGH (ref 0.61–1.24)
GFR, Estimated: 26 mL/min — ABNORMAL LOW (ref >60)
Glucose, Bld: 92 mg/dL (ref 70–99)
Phosphorus: 3.7 mg/dL (ref 2.5–4.6)
Potassium: 4.2 mmol/L (ref 3.5–5.1)
Sodium: 134 mmol/L — ABNORMAL LOW (ref 135–145)

## 2023-01-07 LAB — GLUCOSE, CAPILLARY
Glucose-Capillary: 85 mg/dL (ref 70–99)
Glucose-Capillary: 96 mg/dL (ref 70–99)
Glucose-Capillary: 166 mg/dL — ABNORMAL HIGH (ref 70–99)

## 2023-01-07 MED ORDER — BISACODYL 10 MG RE SUPP
10.0000 mg | Freq: Every day | RECTAL | Status: DC | PRN
  Administered 2023-01-07: 10 mg via RECTAL
  Filled 2023-01-07: qty 1

## 2023-01-07 MED ORDER — SENNOSIDES-DOCUSATE SODIUM 8.6-50 MG PO TABS
2.0000 | ORAL_TABLET | Freq: Two times a day (BID) | ORAL | Status: DC
  Administered 2023-01-07 – 2023-01-21 (×23): 2 via ORAL
  Filled 2023-01-07 (×25): qty 2

## 2023-01-07 MED ORDER — SORBITOL 70 % SOLN
30.0000 mL | Freq: Every day | Status: DC | PRN
  Filled 2023-01-07: qty 30

## 2023-01-07 NOTE — TOC Progression Note (Signed)
Transition of Care [facility]) - Progression Note    Patient Details  Name: [name] MRN: [id number] Date of Birth: [birth year]  Transition of Care [facility]) CM/SW [place], [place] Phone Number: 01/07/2023, 11:06 AM  Clinical Narrative:     Patient has SNF bed at [facility]. CSW following to start insurance authorization close to patient being medically ready for dc. CSW will continue to follow and assist with patients dc planning needs.  Expected Discharge Plan: [place] Barriers to Discharge: Continued Medical Work up  Expected Discharge Plan and Services In-house Referral: Clinical Social Work   Post Acute Care Choice: [place] Living arrangements for the past 2 months: [place] Determinants of Health (SDOH) Interventions SDOH Screenings   Food Insecurity: No Food Insecurity (01/05/2023)  Housing: Low Risk  (12/17/2022)  Transportation Needs: No Transportation Needs (12/17/2022)  Utilities: Not At Risk (12/17/2022)  Tobacco Use: Medium Risk (12/20/2022)    Readmission Risk Interventions    09/16/2022   12:31 PM 08/02/2022    2:23 PM  Readmission Risk Prevention Plan  Transportation Screening Complete Complete  Medication Review (RN Care Manager) Complete Complete  HRI or Home Care Consult Complete Complete  SW Recovery Care/Counseling Consult Complete Complete  Palliative Care Screening Not Applicable Not Applicable  Skilled Nursing Facility Not Applicable Complete

## 2023-01-07 NOTE — Progress Notes (Signed)
[facility] PROGRESS NOTE  URHO RIO (DOB: Oct 12, 1946) DJT:[id number] PCP: Redmond School, MD  Brief Narrative: 77 year old male with past medical history of ESRD on hemodialysis Monday Wednesday Friday, HFrEF, diabetes mellitus type 2, hypertension who presented with nausea. Patient has known history of cholecystitis and underwent cholecystostomy tube placement. His tube had become dislodged and he underwent evaluation with surgery. He was noted to have recanalization of the bile duct therefore tube was not replaced. He came to hospital for elective cholecystectomy underwent preop evaluation with cardiology. His workup was notable for diffuse T wave inversions and prolonged QTc. Also was found to be significantly hypokalemic. He was started on electrolyte replacement. Nephrology was consulted for chronic hemodialysis during hospitalization. He was transferred to [facility] for cath and noted to have multivessel diffuse moderate CAD without obvious flow limiting lesion.  He was cleared for surgery and is now post cholecystecomy on 12/28.  Hospitalization c/b presence of sacral decubitus ulcer.  S/p hydrotherapy.  Currently on antibiotics.  Issue now is poor PO intake.  Had NG tube in place briefly.  Watching PO intake at this time to make decision regarding pursuing possible PEG.     Subjective: Having some nausea today, says he hasn't had BM in several days. Trying to eat more. Got dizzy during dialysis yesterday. No other complaints.  Objective: BP (!) 123/55   Pulse 83   Temp 98.5 F (36.9 C) (Oral)   Resp 15   Ht '6\' 3"'$  (1.905 m)   Wt 89.5 kg   SpO2 100%   BMI 24.66 kg/m   Gen: Frail male in no distress Pulm: Clear, nonlabored  CV: RRR, no edema GI: Abdomen has hypoactive bowel sounds, but is soft without focal tenderness, nondistended. Healed laparoscopic surgical incision sites. Neuro: Alert and oriented. No new focal deficits. Ext: Warm, no deformities Skin: No new rashes,  lesions or ulcers on visualized skin   Assessment & Plan: Chronic cholecystitis -Presented with malnutrition, failure to thrive -General surgery was consulted, underwent cholecystectomy on 12/28 with intraoperative cholangiogram  -Per general surgery; pneumoperitoneum is likely from lap chole done last week -No further intervention recommended   Chronic HFrEF, abnormal ECG -Repeat echo during this admission showed EF of 50 to 55% and 35 to 40% in August 2023 -Continue cardiac monitoring -Left heart cath on 12/26 showed multivessel diffuse moderate CAD with no acute or obvious flow-limiting lesions(in absence of any active anginal symptoms, would not PCI in preop setting)  -Medical management recommended, okayed to proceed with surgery as per cardiology -Started on high-dose statin   ESRD -Nephrology consulted for hemodialysis. No UF 1/15 with hypotension and poor oral intake.   Constipation: LBM said to be 1/10, though oral intake has been very minimal since that time. Abdomen is benign.  - Augment bowel regimen, add sorbitol prn, dulcolax supp prn.  Concern for Afib -none noted per cards, will continue to monitor   Hyperkalemia -improved   Anemia of ESRD -ESA per nephrology -S/p 2 unit PRBC during this hospitalization -Hemoglobin stable at 8.3 this morning   Unstageable sacral ulcer -wound culture growing e. Coli resistant to ampicillin/unasyn, e faecalis resistant to vancomycin, pansensitive proteus  -completed course of hydrotherapy -CT with subcutaneous fat straining with complex collection posterior to the sacrum on the left measuring 2.5x1.2cm concerning for abscess, no evidence of osteo - vancomycin 1/3 - 1/5 - cefepime 1/3 - 1/9. E. Coli and proteus treated x 7 days with cefepime.   - flagyl 1/3-1/9  -  ampicillin 1/8 - 1/15. E. Faecalis treated with ampicillin complete 1/15.   -sacral MRI ordered, not done as patient has SNS -per surgery, no abscess but packing in  the place of ulcer. -Patient has been getting hydrotherapy for past 1 week -1/9 woc recommending stop hydrotherapy/dakins -> medihoney to inner wound and outer black wound q day, fill with gauze, cover with foam, change foam q3 days or prn soiling -Continue oxycodone 5 mg every 4 hours as needed -Continue local wound care as per wound care RN recommendation   Diabetes mellitus type 2 A1c 4.4 (likely reflecting his recent failure to thrive, poor PO intake) D/c SSI   Functional Quadriplegia  Essentially bedbound for the past year (needs help toileting, turning in bed, dressing, etc) -Unclear cause Follow outpatient   Hypophosphatemia -Replete   Hypomagnesemia -Replete   Moderate Protein calorie malnutrition  Poor PO intake -BMI 22.98 kg/m -had cortrak to supplement nutrition in setting of his decubitus ulcer, but this was d/c'd after about 1 week  - Palliative care team and medical team have discussed indications for PEG with the patient who opts to have this placed if he is unable to maintain nutrition/hydration on his own. We have started calorie count to quantify oral intake to inform next step in management.  Patrecia Pour, MD [facility] [email or web address] 01/07/2023, 2:17 PM

## 2023-01-07 NOTE — Progress Notes (Signed)
Nutrition Follow-up  DOCUMENTATION CODES:   Non-severe (moderate) malnutrition in context of chronic illness  INTERVENTION:  Plan is for 48 hour calorie count to quantify oral intake and inform next step in management. Although order was initially placed yesterday evening, calorie count was not officially started until this morning with breakfast. RD will follow-up on results from first 24 hours of calorie count tomorrow.  Continue liberalized, regular diet. Provide 1:1 assistance at meals.   Continue Ensure Enlive po TID, each supplement provides 350 kcal and 20 grams of protein.  Continue PROSource Plus 30 mL po BID, each supplement provides 100 kcal and 15 grams of protein.  Continue Rena-vite po QHS.  NUTRITION DIAGNOSIS:   Moderate Malnutrition related to chronic illness (ESRD on HD, chronic cholecystitis) as evidenced by moderate fat depletion, moderate muscle depletion, percent weight loss.  Ongoing.  GOAL:   Patient will meet greater than or equal to 90% of their needs  Progressing with interventions.  MONITOR:   PO intake, Supplement acceptance, Labs, Weight trends, TF tolerance, I & O's, Skin  REASON FOR ASSESSMENT:   Malnutrition Screening Tool    ASSESSMENT:   77 year old male with PMHx of ESRD on HD M/W/F, HFrEF, DM type 2, HTN, chronic cholecystitis s/p cholecystostomy tube placement that was later dislodged who presented with nausea. Pt underwent cholecystectomy on 12/28 with intraoperative cholangiogram. Pt also with unstageable sacral ulcer that progressed as compared to previous imaging 12/21; no debridement recommended by general surgery.  12/28: s/p laparoscopic cholecystectomy with intraoperative ICG cholangiography   1/5: Cortrak tube placed and nocturnal tube feeds initiated 1/11: Cortrak tube removed  1/13: Palliative Medicine consult - pt is agreeable to PEG placement if he is unable to maintain nutrition on his own  Pt had dialysis yesterday  with no UF.  Met with pt at bedside. He reports his appetite is improving. Per review of chart pt ate 20% of breakfast on 1/14, 40% of lunch on 1/14 and 75% of dinner on 1/14. No meal documentation available for 1/15. Patient had already eaten breakfast at time of RD assessment and was drinking an Ensure supplement. Pt reports MD let him know about plan for calorie count. Order was placed last night. No meal tickets available from dinner yesterday. Will officially start calorie count with breakfast today. Updated order to include instructions for calorie count and put printed copy of instructions on envelope on door.  Pt was 98.1 kg on 08/23/22. He was 83.4 kg on 12/25/22. He lost 14.7 kg or 15% wt over 4 months, which is significant for time frame. Most recent wt is 89.5 kg, which may be falsely elevated. Dry wt is 84.5 kg per Nephrology note. Will continue to monitor trends.  UOP: no UOP  I/O: +8044.4 mL since 12/24/22  Medications reviewed and include: PROSource Plus 30 mL BID, allopurinol, Ensure Enlive TID, Rena-vite, Miralax, senna-docusate  Labs reviewed: CBG 85-132, Sodium 134, Chloride 97, BUN 28, Creatinine 2.45  Diet Order:   Diet Order             Diet regular Room service appropriate? Yes; Fluid consistency: Thin  Diet effective now                  EDUCATION NEEDS:   Not appropriate for education at this time  Skin:  Skin Assessment: Skin Integrity Issues: Skin Integrity Issues:: Stage IV, Incisions Stage IV: initially unstageable now evolved to stage 4 to sacrum (4 cm x 3 cm x  1.5 cm); wound has improved and no longer requires hydrotherapy or Dakins Unstageable: N/A Incisions: closed incision to abdomen  Last BM:  01/01/23 - medium type 5  Height:   Ht Readings from Last 1 Encounters:  12/17/22 '6\' 3"'$  (1.905 m)   Weight:   Wt Readings from Last 1 Encounters:  01/07/23 89.5 kg   Ideal Body Weight:  89.1 kg  BMI:  Body mass index is 24.66 kg/m.  Estimated  Nutritional Needs:   Kcal:  2100-2400  Protein:  105-120 grams  Fluid:  UOP + 1 L  Johnston Maddocks Magda Paganini, MS, RD, LDN, CNSC Pager number available on Amion

## 2023-01-07 NOTE — Progress Notes (Signed)
Daily Progress Note   Patient Name: [name]       Date: 01/07/2023 DOB: [birth year]  Age: 77 y.o. MRN#: [id number] Attending Physician: Patrecia Pour, MD Primary Care Physician: Redmond School, MD Admit Date: 12/12/2022 Length of Stay: 25 days  Reason for Consultation/Follow-up: Establishing goals of care  HPI/Patient Profile:  77 y.o. 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  He had known cholecystitis status post cholecystostomy tube placement that became dislodged and underwent elective cholecystectomy.  He was admitted on 12/12/2022 with chronic cholecystitis, chronic HFrEF with abnormal ECG, ESRD, A-fib with RVR, unstageable sacral ulcer, protein calorie malnutrition, and others.   PMT was consulted for [place] conversations.  Subjective:   Subjective: Chart Reviewed. Updates received. Patient Assessed. Created space and opportunity for patient  and family to explore thoughts and feelings regarding current medical situation.  Today's Discussion: Today I saw the patient at the bedside.  He looks somewhat in distress when asked him he states he is nauseous.  The nurse was at the bedside and states the doctor just put in a medication for nausea and she is going to get it.  At 1 point he felt very close to vomiting.  However, he denies any actual vomiting.  No pain today.  He states he is doing pretty good overall.  Per nursing staff he only ate 10% of his breakfast this morning but he is drinking several ensures a day.  Calorie count was started this morning to inform whether or not a permanent feeding tube would be needed.  He expressed being tired of being sick.  We discussed that there are always options if he is tired of pursuing any particular treatment.  He does have some continued wound discomfort and I reminded him that he has pain medications available (he is only  received 1 dose of 5:00 this morning).  I encouraged him to ask for pain medicine if he is in pain.  I again asked him if he is willing to undergo a procedure to place a permanent feeding tube if he cannot eat enough to sustain himself.  He again states that yes he would want a feeding tube if needed.  I told him that our team would back off for a bed to allow time to see if it PEG is needed and/or placed.  At this time his goals appear clear.  I provided emotional and general support through therapeutic listening, empathy, sharing of stories, therapeutic touch, and other techniques. I answered all questions and addressed all concerns to the best of my ability.  Review of Systems  Constitutional:  Positive for fatigue.  Respiratory:  Negative for cough and shortness of breath.   Gastrointestinal:  Positive for nausea (Rn getting antiemetic). Negative for abdominal pain and vomiting.  Musculoskeletal:        Sacral discomfort    Objective:   Vital Signs:  BP (!) 123/55   Pulse 83   Temp 98.5 F (36.9 C) (Oral)   Resp 15   Ht '6\' 3"'$  (1.905 m)   Wt 89.5 kg   SpO2 100%   BMI 24.66 kg/m   Physical Exam: Physical Exam Vitals and nursing note reviewed.  Constitutional:      General: He is not in acute distress.    Appearance: He is ill-appearing.  HENT:     Head: Normocephalic and atraumatic.  Cardiovascular:  Rate and Rhythm: Normal rate.  Pulmonary:     Effort: Pulmonary effort is normal. No respiratory distress.     Breath sounds: No wheezing or rhonchi.  Abdominal:     General: Abdomen is flat. Bowel sounds are normal. There is no distension.     Palpations: Abdomen is soft.  Skin:    General: Skin is warm and dry.  Neurological:     General: No focal deficit present.     Mental Status: He is alert.  Psychiatric:        Mood and Affect: Mood normal.        Behavior: Behavior normal.     Palliative Assessment/Data: 10% (feeding tube in place)    Existing  Vynca/ACP Documentation: None  Assessment & Plan:   Impression: Present on Admission:  Chronic cholecystitis  Nausea and vomiting  Diabetes mellitus type 2 in obese (HCC)  Anemia, normocytic normochromic  HFrEF (heart failure with reduced ejection fraction) (HCC)  ESRD (end stage renal disease) (HCC)  Essential hypertension  Pressure injury of skin of sacral region  Prolonged QT interval  Hypokalemia  Coronary artery disease involving native coronary artery of native heart without angina pectoris  Heart failure with improved ejection fraction (HFimpEF) (HCC)  Abnormal EKG  77 year old male with chronic and acute comorbidities and presentations as described above. His biggest issue right now is malnutrition. Previously had a core track though this is not longer in place. Has been cleared for oral intake but having poor intake. Encouraged him to continue to try to eat by mouth. He is accepting of a PEG tube if it is necessary for nutrition in order to allow him to "keep living". He again affirmed this choice today. He names his brother [name] as his Air traffic controller. Overall long-term prognosis guarded to poor   SUMMARY OF RECOMMENDATIONS   Remain full code Full scope of care Remains agreeable to PEG tube if needed Continue calorie count Continued encouragement of oral intake Pending decision on necessity of PEG PMT will back off for now Please notify us for significant clinical change, further palliative needs  Symptom Management:  Per primary team PMT is available to assist as needed  Code Status: Full code  Prognosis: Unable to determine  Discharge Planning: To Be Determined  Discussed with: Patient, medical team, nursing team  Thank you for allowing [place] to participate in the care of SKYLER CAREL PMT will continue to support holistically.  Time Total: 40 min  Visit consisted of counseling and education dealing with the complex and emotionally intense issues  of symptom management and palliative care in the setting of serious and potentially life-threatening illness. Greater than 50%  of this time was spent counseling and coordinating care related to the above assessment and plan.  Walden Field, NP Palliative Medicine Team  Team Phone # [phone] (Nights/Weekends)  08/21/2021, 8:17 AM

## 2023-01-07 NOTE — Progress Notes (Signed)
[facility] Progress Note   Subjective:    Seen in room. Had dialysis yesterday -no UF. Felt better, no dizziness. Does c/o constipation this am.   Objective Vitals:   01/07/23 0428 01/07/23 0502 01/07/23 0819 01/07/23 0917  BP: (!) 140/58  (!) 102/35 (!) 123/55  Pulse: 77  83   Resp: 13  15   Temp: 98.6 F (37 C)  98.5 F (36.9 C)   TempSrc: Oral  Oral   SpO2: 100%     Weight:  89.5 kg    Height:       Physical Exam General: Elderly man, frail, nad  Heart: RRR no MRG Lungs: Clear bilaterally,  nonlabored breathing Abdomen: soft non tender  Extremities: No lower extremity edema  Access: Right IJ [facility]   Filed Weights   01/06/23 1832 01/06/23 2357 01/07/23 0502  Weight: 92.9 kg 92 kg 89.5 kg    Intake/Output Summary (Last 24 hours) at 01/07/2023 0928 Last data filed at 01/07/2023 2671 Gross per 24 hour  Intake 240 ml  Output 0 ml  Net 240 ml     Additional Objective Labs: Basic Metabolic Panel: Recent Labs  Lab 01/05/23 0158 01/06/23 0440 01/07/23 0227  NA 136 138 134*  K 3.9 4.5 4.2  CL 99 98 97*  CO2 '26 26 27  '$ GLUCOSE 114* 93 92  BUN 49* 58* 28*  CREATININE 3.47* 4.43* 2.45*  CALCIUM 9.7 9.7 9.0  PHOS 5.1* 5.8* 3.7    Liver Function Tests: Recent Labs  Lab 01/05/23 0158 01/06/23 0440 01/07/23 0227  ALBUMIN 2.0* 1.9* 1.9*    No results for input(s): "LIPASE", "AMYLASE" in the last 168 hours. CBC: Recent Labs  Lab 01/01/23 0130 01/03/23 0812 01/05/23 0158 01/06/23 0223  WBC 11.6* 10.5 8.8 9.3  NEUTROABS  --   --  4.6 5.4  HGB 7.9* 8.4* 8.3* 8.9*  HCT 24.0* 25.9* 26.3* 28.1*  MCV 97.6 98.5 100.4* 101.8*  PLT 364 390 372 362    Blood Culture    Component Value Date/Time   SDES WOUND 12/25/2022 1715   SPECREQUEST NONE 12/25/2022 1715   CULT  12/25/2022 1715    FEW ESCHERICHIA COLI ABUNDANT PROTEUS MIRABILIS ABUNDANT ENTEROCOCCUS FAECALIS VANCOMYCIN RESISTANT ENTEROCOCCUS    REPTSTATUS 12/29/2022 FINAL 12/25/2022  1715    Cardiac Enzymes: No results for input(s): "CKTOTAL", "CKMB", "CKMBINDEX", "TROPONINI" in the last 168 hours. CBG: Recent Labs  Lab 01/06/23 1133 01/06/23 2148 01/06/23 2353 01/07/23 0436 01/07/23 0822  GLUCAP 126* 100* 132* 85 96    Iron Studies: No results for input(s): "IRON", "TIBC", "TRANSFERRIN", "FERRITIN" in the last 72 hours. Lab Results  Component Value Date   INR 1.1 09/13/2022   INR 1.5 (H) 08/20/2022   INR 1.6 (H) 08/19/2022   Studies/Results: No results found.  Medications:  sodium chloride Stopped (01/06/23 0425)    (feeding supplement) PROSource Plus  30 mL Oral BID BM   sodium chloride   Intravenous Once   allopurinol  100 mg Oral Daily   aspirin EC  81 mg Oral Daily   atorvastatin  40 mg Oral Daily   Chlorhexidine Gluconate Cloth  6 each Topical Q0600   cycloSPORINE  1 drop Both Eyes BID   darbepoetin (ARANESP) injection - DIALYSIS  200 mcg Subcutaneous Q Fri-1800   feeding supplement  237 mL Oral TID WC   Gerhardt's butt cream   Topical BID   heparin  5,000 Units Subcutaneous Q8H   isosorbide dinitrate  5  mg Oral BID   leptospermum manuka honey  1 Application Topical Daily   melatonin  4.5 mg Oral QHS   multivitamin  1 tablet Oral QHS   polyethylene glycol  17 g Oral Daily   senna-docusate  1 tablet Oral BID   sodium chloride flush  3 mL Intravenous Q12H    Dialysis Orders: [facility] MWF 4h  400/800  84.5kg  2/2.5 bath   Heparin 4000 units  + 3000 mid treatment  [facility] TDC - last OP HD 12/20, post wt 85.9kg - last IP hD 12/24, post wt 85kg - mircera 225 mcg IV q2, last 12/18, due 1/02 - no vdra  Assessment/Plan: ESRD - on HD MWF . He reports history of vertigo and how it worsens on HD. Next HD 1/17 -minimal UF. Chronic cholecystitis - w/ nausea, ( now resolved )  malnutrition, FTT.  S/p laparoscopic cholecystectomy 12/19/2022. No further intervention planned Sacral decubitus ulcer - Per primary. Completed hydrotherapy/course of antibiotics.  No surgical intervention currently needed Chronic HFrEF - LVEF up to 50-55% here, diffuse CAD.  HTN/ volume - BP remain soft/stable. Not on any anti-hypertensives. No volume excess. UF as tolerated. Keep SBP >100. Doubt bed weights accurate.  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 Tsat low but infection picture and ferritin high. 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 No binder or VDRA currently. On regular diet because of FTT/nutrition DM2 - per pmd   Nutrition -a.m. albumin low 1.9 PCM, FTT. On regular diet + protein supp per RD. To get PEG ? [place]- Palliative Care was consulted --Full code/full scope of care.  Dispo - For SNF   [facility] 01/07/2023,9:28 AM

## 2023-01-08 DIAGNOSIS — K811 Chronic cholecystitis: Secondary | ICD-10-CM | POA: Diagnosis not present

## 2023-01-08 DIAGNOSIS — L89159 Pressure ulcer of sacral region, unspecified stage: Secondary | ICD-10-CM

## 2023-01-08 DIAGNOSIS — R630 Anorexia: Secondary | ICD-10-CM | POA: Diagnosis not present

## 2023-01-08 LAB — CBC
HCT: 26.8 % — ABNORMAL LOW (ref 39.0–52.0)
Hemoglobin: 8.5 g/dL — ABNORMAL LOW (ref 13.0–17.0)
MCH: 31.5 pg (ref 26.0–34.0)
MCHC: 31.7 g/dL (ref 30.0–36.0)
MCV: 99.3 fL (ref 80.0–100.0)
Platelets: 353 10*3/uL (ref 150–400)
RBC: 2.7 MIL/uL — ABNORMAL LOW (ref 4.22–5.81)
RDW: 19.2 % — ABNORMAL HIGH (ref 11.5–15.5)
WBC: 9.2 10*3/uL (ref 4.0–10.5)
nRBC: 0 % (ref 0.0–0.2)

## 2023-01-08 LAB — RENAL FUNCTION PANEL
Albumin: 1.9 g/dL — ABNORMAL LOW (ref 3.5–5.0)
Anion gap: 10 (ref 5–15)
BUN: 40 mg/dL — ABNORMAL HIGH (ref 8–23)
CO2: 26 mmol/L (ref 22–32)
Calcium: 9.6 mg/dL (ref 8.9–10.3)
Chloride: 98 mmol/L (ref 98–111)
Creatinine, Ser: 3.53 mg/dL — ABNORMAL HIGH (ref 0.61–1.24)
GFR, Estimated: 17 mL/min — ABNORMAL LOW (ref >60)
Glucose, Bld: 81 mg/dL (ref 70–99)
Phosphorus: 5.1 mg/dL — ABNORMAL HIGH (ref 2.5–4.6)
Potassium: 4.6 mmol/L (ref 3.5–5.1)
Sodium: 134 mmol/L — ABNORMAL LOW (ref 135–145)

## 2023-01-08 LAB — GLUCOSE, CAPILLARY
Glucose-Capillary: 102 mg/dL — ABNORMAL HIGH (ref 70–99)
Glucose-Capillary: 133 mg/dL — ABNORMAL HIGH (ref 70–99)
Glucose-Capillary: 142 mg/dL — ABNORMAL HIGH (ref 70–99)
Glucose-Capillary: 85 mg/dL (ref 70–99)
Glucose-Capillary: 94 mg/dL (ref 70–99)
Glucose-Capillary: 96 mg/dL (ref 70–99)

## 2023-01-08 MED ORDER — HEPARIN SODIUM (PORCINE) 1000 UNIT/ML IJ SOLN
INTRAMUSCULAR | Status: AC
  Administered 2023-01-08: 3800 [IU]
  Filled 2023-01-08: qty 4

## 2023-01-08 MED ORDER — GABAPENTIN 100 MG PO CAPS
100.0000 mg | ORAL_CAPSULE | Freq: Three times a day (TID) | ORAL | Status: DC
  Administered 2023-01-08 – 2023-01-21 (×37): 100 mg via ORAL
  Filled 2023-01-08 (×36): qty 1

## 2023-01-08 NOTE — Progress Notes (Signed)
Received patient in bed to unit.  Alert and oriented.  Informed consent signed and in chart.   Treatment initiated: 0939 Treatment completed: 1307  Patient tolerated well.  Transported back to the room  Alert, without acute distress.  Hand-off given to patient's nurse.   Access used: Catheter Access issues: none  Total UF removed: Keep even Medication(s) given: none Post HD VS: 98.2,135/77,98%,15,79 Post HD weight: no change   [facility]

## 2023-01-08 NOTE — Progress Notes (Signed)
[facility] Progress Note   Subjective:    Seen in bed- just prior to the start of dialysis.  Asking to be repositioned- assisted pt with help of PCT- appreciate assistance.   Objective Vitals:   01/08/23 0421 01/08/23 0500 01/08/23 0917 01/08/23 0939  BP: (!) 130/56  125/72 129/62  Pulse: 85  72 77  Resp: '15  17 16  '$ Temp: 98.3 F (36.8 C)  98.1 F (36.7 C)   TempSrc: Oral  Oral   SpO2: 95%  97% 97%  Weight:  90.5 kg 91 kg   Height:       Physical Exam General: Elderly man, frail, nad  Heart: RRR no MRG Lungs: Clear bilaterally,  nonlabored breathing Abdomen: soft non tender  Extremities: No lower extremity edema  Access: Right IJ [facility]   Filed Weights   01/07/23 0502 01/08/23 0500 01/08/23 0917  Weight: 89.5 kg 90.5 kg 91 kg    Intake/Output Summary (Last 24 hours) at 01/08/2023 0949 Last data filed at 01/08/2023 0800 Gross per 24 hour  Intake 360 ml  Output 525 ml  Net -165 ml    Additional Objective Labs: Basic Metabolic Panel: Recent Labs  Lab 01/06/23 0440 01/07/23 0227 01/08/23 0245  NA 138 134* 134*  K 4.5 4.2 4.6  CL 98 97* 98  CO2 '26 27 26  '$ GLUCOSE 93 92 81  BUN 58* 28* 40*  CREATININE 4.43* 2.45* 3.53*  CALCIUM 9.7 9.0 9.6  PHOS 5.8* 3.7 5.1*   Liver Function Tests: Recent Labs  Lab 01/06/23 0440 01/07/23 0227 01/08/23 0245  ALBUMIN 1.9* 1.9* 1.9*   No results for input(s): "LIPASE", "AMYLASE" in the last 168 hours. CBC: Recent Labs  Lab 01/03/23 0812 01/05/23 0158 01/06/23 0223 01/08/23 0245  WBC 10.5 8.8 9.3 9.2  NEUTROABS  --  4.6 5.4  --   HGB 8.4* 8.3* 8.9* 8.5*  HCT 25.9* 26.3* 28.1* 26.8*  MCV 98.5 100.4* 101.8* 99.3  PLT 390 372 362 353   Blood Culture    Component Value Date/Time   SDES WOUND 12/25/2022 1715   SPECREQUEST NONE 12/25/2022 1715   CULT  12/25/2022 1715    FEW ESCHERICHIA COLI ABUNDANT PROTEUS MIRABILIS ABUNDANT ENTEROCOCCUS FAECALIS VANCOMYCIN RESISTANT ENTEROCOCCUS    REPTSTATUS  12/29/2022 FINAL 12/25/2022 1715    Cardiac Enzymes: No results for input(s): "CKTOTAL", "CKMB", "CKMBINDEX", "TROPONINI" in the last 168 hours. CBG: Recent Labs  Lab 01/07/23 0436 01/07/23 0822 01/07/23 2035 01/08/23 0022 01/08/23 0333  GLUCAP 85 96 166* 94 85   Iron Studies: No results for input(s): "IRON", "TIBC", "TRANSFERRIN", "FERRITIN" in the last 72 hours. Lab Results  Component Value Date   INR 1.1 09/13/2022   INR 1.5 (H) 08/20/2022   INR 1.6 (H) 08/19/2022   Studies/Results: No results found.  Medications:  sodium chloride Stopped (01/06/23 0425)    (feeding supplement) PROSource Plus  30 mL Oral BID BM   sodium chloride   Intravenous Once   allopurinol  100 mg Oral Daily   aspirin EC  81 mg Oral Daily   atorvastatin  40 mg Oral Daily   Chlorhexidine Gluconate Cloth  6 each Topical Q0600   cycloSPORINE  1 drop Both Eyes BID   darbepoetin (ARANESP) injection - DIALYSIS  200 mcg Subcutaneous Q Fri-1800   feeding supplement  237 mL Oral TID WC   Gerhardt's butt cream   Topical BID   heparin  5,000 Units Subcutaneous Q8H   isosorbide dinitrate  5  mg Oral BID   leptospermum manuka honey  1 Application Topical Daily   melatonin  4.5 mg Oral QHS   multivitamin  1 tablet Oral QHS   polyethylene glycol  17 g Oral Daily   senna-docusate  2 tablet Oral BID   sodium chloride flush  3 mL Intravenous Q12H    Dialysis Orders: [facility] MWF 4h  400/800  84.5kg  2/2.5 bath   Heparin 4000 units  + 3000 mid treatment  [facility] TDC - last OP HD 12/20, post wt 85.9kg - last IP hD 12/24, post wt 85kg - mircera 225 mcg IV q2, last 12/18, due 1/02 - no vdra  Assessment/Plan: ESRD - on HD MWF . He reports history of vertigo and how it worsens on HD. Next HD 1/17 -minimal UF.  D/w pt today- we will keep even Chronic cholecystitis - w/ nausea, ( now resolved )  malnutrition, FTT.  S/p laparoscopic cholecystectomy 12/19/2022. No further intervention planned Sacral decubitus ulcer - Per  primary. Completed hydrotherapy/course of antibiotics. No surgical intervention currently needed Chronic HFrEF - LVEF up to 50-55% here, diffuse CAD.  HTN/ volume - BP remain soft/stable. Not on any anti-hypertensives. No volume excess. UF as tolerated. Keep SBP >100. Doubt bed weights accurate.  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 Tsat low but infection picture and ferritin high. 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 No binder or VDRA currently. On regular diet because of FTT/nutrition DM2 - per pmd   Nutrition -a.m. albumin low 1.9 PCM, FTT. On regular diet + protein supp per RD. To get PEG- updated pall care note reviewed from yesterday- looks like he's still interested in it. [place]- Palliative Care was consulted --Full code/full scope of care.  Dispo - For SNF   [name] MD [place] [facility] [phone] 01/08/2023,9:49 AM

## 2023-01-08 NOTE — Procedures (Signed)
Patient seen and examined on Hemodialysis. The procedure was supervised and I have made appropriate changes. BP 132/64 (BP Location: Left Arm)   Pulse 78   Temp 98.1 F (36.7 C) (Oral)   Resp 14   Ht '6\' 3"'$  (1.905 m)   Wt 91 kg   SpO2 98%   BMI 25.07 kg/m   QB 400 mL min via TDC, UF goal  even.  Tolerating treatment without complaints at this time.  Has been resting most of the rx   [name] MD [place] [facility] [phone] 12:46 PM

## 2023-01-08 NOTE — TOC Progression Note (Signed)
Transition of Care [facility]) - Progression Note    Patient Details  Name: [name] MRN: [id number] Date of Birth: 1946-12-02  Transition of Care [facility]) CM/SW [place], [place] Phone Number: 01/08/2023, 1:21 PM  Clinical Narrative:     CSW following to start insurance authorization close to patient being medically ready for dc. Patient has SNF bed at [facility]. CSW will continue to follow and assist with patients dc planning needs.  Expected Discharge Plan: [place] Barriers to Discharge: Continued Medical Work up  Expected Discharge Plan and Services In-house Referral: Clinical Social Work   Post Acute Care Choice: [place] Living arrangements for the past 2 months: [place] Determinants of Health (SDOH) Interventions SDOH Screenings   Food Insecurity: No Food Insecurity (01/05/2023)  Housing: Low Risk  (12/17/2022)  Transportation Needs: No Transportation Needs (12/17/2022)  Utilities: Not At Risk (12/17/2022)  Tobacco Use: Medium Risk (12/20/2022)    Readmission Risk Interventions    09/16/2022   12:31 PM 08/02/2022    2:23 PM  Readmission Risk Prevention Plan  Transportation Screening Complete Complete  Medication Review (RN Care Manager) Complete Complete  HRI or Home Care Consult Complete Complete  SW Recovery Care/Counseling Consult Complete Complete  Palliative Care Screening Not Applicable Not Applicable  Skilled Nursing Facility Not Applicable Complete

## 2023-01-08 NOTE — Progress Notes (Addendum)
Calorie Count Note  48 hour calorie count ordered. Day 1 results below:  Diet: regular diet Supplements: Ensure Enlive po TID, PROSource Plus 30 mL po BID  Of note, this was a day pt did not receive HD.  Breakfast 1/16: 50% scrambled eggs, 20% home fried potatoes, 100% orange juice (139 kcal, 6 grams of protein) Lunch 1/16: 25% pot roast with gravy, 75% mashed potatoes, 75% corn (189 kcal, 7 grams of protein) Dinner 1/16: pt ate 0% of dinner Supplements: 100% of 2 Ensure and 80% of 1 Ensure, 50% of one PROSource Plus supplement and refused the other (1030 kcal, 63.5 grams protein)  Total intake Day 1: 1358 kcal (65% of minimum estimated needs)  76.5 grams protein (73% of minimum estimated needs)  Estimated Nutritional Needs:  Kcal:  2100-2400 Protein:  105-120 grams Fluid:  UOP + 1 L  Nutrition Dx: Moderate Malnutrition related to chronic illness (ESRD on HD, chronic cholecystitis) as evidenced by moderate fat depletion, moderate muscle depletion, percent weight loss.   Goal: Patient will meet greater than or equal to 90% of their needs   Intervention:  -Continue 48 hours calorie count. -Continue liberalized, regular diet. Provide 1:1 assistance at meals. -Continue Ensure Enlive po TID, each supplement provides 350 kcal and 20 grams of protein. -Continue PROSource Plus 30 mL po BID, each supplement provides 100 kcal and 15 grams of protein. -Continue Rena-vite po QHS.  Loanne Drilling, MS, RD, LDN, CNSC Pager number available on Amion

## 2023-01-08 NOTE — Progress Notes (Signed)
[facility] PROGRESS NOTE   CARRIE SCHOONMAKER RKY:[id number] DOB: 09/01/1946 DOA: 12/12/2022  PCP: Redmond School, MD  Brief History/Interval Summary: 77 year old male with past medical history of ESRD on hemodialysis Monday Wednesday Friday, HFrEF, diabetes mellitus type 2, hypertension who presented with nausea. Patient has known history of cholecystitis and underwent cholecystostomy tube placement. His tube had become dislodged and he underwent evaluation with surgery. He was noted to have recanalization of the bile duct therefore tube was not replaced. He came to hospital for elective cholecystectomy underwent preop evaluation with cardiology. His workup was notable for diffuse T wave inversions and prolonged QTc. Also was found to be significantly hypokalemic. He was started on electrolyte replacement. Nephrology was consulted for chronic hemodialysis during hospitalization. He was transferred to [facility] for cath and noted to have multivessel diffuse moderate CAD without obvious flow limiting lesion.  He was cleared for surgery and is now post cholecystecomy on 12/28.  Hospitalization c/b presence of sacral decubitus ulcer.  S/p hydrotherapy.  Currently on antibiotics.  Issue now is poor PO intake.  Had NG tube in place briefly.  Watching PO intake at this time to make decision regarding pursuing possible PEG.       Consultants: Nephrology.  Cardiology.  Palliative care    Subjective/Interval History: Patient denies any pain issues this morning.  Mentions that he ate well last night.  Looking forward to his meals today.  Wants to avoid PEG tube if possible.    Assessment/Plan:  Chronic cholecystitis Now status post cholecystectomy.  Stable. -Per general surgery; pneumoperitoneum is likely from lap chole done last week  Chronic systolic and diastolic CHF Echo cardiogram showed EF to be 50 to 55%.  Previous EF was 35 to 40% in August 2023. Underwent cardiac catheterization which  showed multivessel diffuse moderate CAD without any acute or obvious flow-limiting lesions. Medical management being continued. Noted to be on aspirin, statin, nitrates.  End-stage renal disease Nephrology is following.  Dialyzed on Monday Wednesday Friday schedule.  Constipation Continue bowel regimen.  Anemia of chronic kidney disease No evidence for overt bleeding.  Hemoglobin stable.  Unstageable sacral ulcer -wound culture growing e. Coli resistant to ampicillin/unasyn, e faecalis resistant to vancomycin, pansensitive proteus  -completed course of hydrotherapy -CT with subcutaneous fat straining with complex collection posterior to the sacrum on the left measuring 2.5x1.2cm concerning for abscess, no evidence of osteo - vancomycin 1/3 - 1/5 - cefepime 1/3 - 1/9. E. Coli and proteus treated x 7 days with cefepime.   - flagyl 1/3-1/9  - ampicillin 1/8 - 1/15. E. Faecalis treated with ampicillin complete 1/15.   -sacral MRI could not be done due to the presence of the nerve stimulator -per surgery, no abscess but packing in the place of ulcer. -Patient has been getting hydrotherapy for past 1 week -1/9 woc recommending stop hydrotherapy/dakins -> medihoney to inner wound and outer black wound q day, fill with gauze, cover with foam, change foam q3 days or prn soiling -Continue oxycodone 5 mg every 4 hours as needed -Continue local wound care as per wound care RN recommendation   Diabetes mellitus type 2 A1c 4.4 (likely reflecting his recent failure to thrive, poor PO intake) D/c SSI   Functional Quadriplegia  Essentially bedbound for the past year (needs help toileting, turning in bed, dressing, etc)  Hypophosphatemia -Repleted   Hypomagnesemia -Repleted   Moderate Protein calorie malnutrition  Poor PO intake -BMI 22.98 kg/m -had cortrak to supplement nutrition in setting  of his decubitus ulcer, but this was d/c'd after about 1 week  - Palliative care team and medical  team have discussed indications for PEG with the patient who opts to have this placed if he is unable to maintain nutrition/hydration on his own. We have started calorie count to quantify oral intake to inform next step in management.  DVT Prophylaxis: Subcutaneous heparin Code Status: Full code Family Communication: Discussed with patient Disposition Plan: Plan is for discharge to SNF when his nutritional issues have been sorted out  Status is: Inpatient Remains inpatient appropriate because: Poor oral intake   Medications: Scheduled:  (feeding supplement) PROSource Plus  30 mL Oral BID BM   sodium chloride   Intravenous Once   allopurinol  100 mg Oral Daily   aspirin EC  81 mg Oral Daily   atorvastatin  40 mg Oral Daily   Chlorhexidine Gluconate Cloth  6 each Topical Q0600   cycloSPORINE  1 drop Both Eyes BID   darbepoetin (ARANESP) injection - DIALYSIS  200 mcg Subcutaneous Q Fri-1800   feeding supplement  237 mL Oral TID WC   Gerhardt's butt cream   Topical BID   heparin  5,000 Units Subcutaneous Q8H   isosorbide dinitrate  5 mg Oral BID   leptospermum manuka honey  1 Application Topical Daily   melatonin  4.5 mg Oral QHS   multivitamin  1 tablet Oral QHS   polyethylene glycol  17 g Oral Daily   senna-docusate  2 tablet Oral BID   sodium chloride flush  3 mL Intravenous Q12H   Continuous:  sodium chloride Stopped (01/06/23 0425)   YBO:FBPZWC chloride, acetaminophen, bisacodyl, fentaNYL (SUBLIMAZE) injection, heparin, LORazepam, nitroGLYCERIN, oxyCODONE, polyethylene glycol, polyvinyl alcohol, prochlorperazine, simethicone, sorbitol, traMADol  Antibiotics: Anti-infectives (From admission, onward)    Start     Dose/Rate Route Frequency Ordered Stop   12/30/22 2200  ampicillin (OMNIPEN) 2 g in sodium chloride 0.9 % 100 mL IVPB        2 g 300 mL/hr over 20 Minutes Intravenous Every 12 hours 12/30/22 1519 01/06/23 0445   12/30/22 1145  linezolid (ZYVOX) IVPB 600 mg  Status:   Discontinued        600 mg 300 mL/hr over 60 Minutes Intravenous Every 12 hours 12/30/22 1056 12/30/22 1519   12/28/22 0000  vancomycin (VANCOCIN) IVPB 1000 mg/200 mL premix  Status:  Discontinued        1,000 mg 200 mL/hr over 60 Minutes Intravenous Every M-W-F (Hemodialysis) 12/27/22 1354 12/30/22 1056   12/27/22 1445  vancomycin (VANCOCIN) IVPB 1000 mg/200 mL premix        1,000 mg 200 mL/hr over 60 Minutes Intravenous  Once 12/27/22 1354 12/27/22 1738   12/27/22 1200  vancomycin (VANCOCIN) IVPB 1000 mg/200 mL premix  Status:  Discontinued        1,000 mg 200 mL/hr over 60 Minutes Intravenous Every M-W-F (Hemodialysis) 12/25/22 1831 12/27/22 1354   12/26/22 2100  ceFEPIme (MAXIPIME) 1 g in sodium chloride 0.9 % 100 mL IVPB        1 g 200 mL/hr over 30 Minutes Intravenous Every 24 hours 12/26/22 1052 12/31/22 2034   12/25/22 1900  ceFEPIme (MAXIPIME) 1 g in sodium chloride 0.9 % 100 mL IVPB  Status:  Discontinued        1 g 200 mL/hr over 30 Minutes Intravenous Every 24 hours 12/25/22 1812 12/26/22 1052   12/25/22 1900  vancomycin (VANCOREADY) IVPB 1750 mg/350 mL  1,750 mg 175 mL/hr over 120 Minutes Intravenous  Once 12/25/22 1812 12/26/22 0146   12/25/22 1900  metroNIDAZOLE (FLAGYL) IVPB 500 mg        500 mg 100 mL/hr over 60 Minutes Intravenous Every 12 hours 12/25/22 1812 01/01/23 0700   12/13/22 0600  cefoTEtan (CEFOTAN) 2 g in sodium chloride 0.9 % 100 mL IVPB  Status:  Discontinued        2 g 200 mL/hr over 30 Minutes Intravenous On call to O.R. 12/12/22 1631 12/14/22 0559   12/12/22 0615  cefoTEtan (CEFOTAN) 2 g in sodium chloride 0.9 % 100 mL IVPB  Status:  Discontinued        2 g 200 mL/hr over 30 Minutes Intravenous On call to O.R. 12/12/22 0614 12/12/22 0913       Objective:  Vital Signs  Vitals:   01/08/23 0939 01/08/23 1000 01/08/23 1030 01/08/23 1100  BP: 129/62 121/73 119/65 129/63  Pulse: 77 74 75 79  Resp: '16 17 19 14  '$ Temp:      TempSrc:       SpO2: 97% 99% 99% 98%  Weight:      Height:        Intake/Output Summary (Last 24 hours) at 01/08/2023 1112 Last data filed at 01/08/2023 0800 Gross per 24 hour  Intake 360 ml  Output 525 ml  Net -165 ml   Filed Weights   01/07/23 0502 01/08/23 0500 01/08/23 0917  Weight: 89.5 kg 90.5 kg 91 kg    General appearance: Awake alert.  In no distress Resp: Clear to auscultation bilaterally.  Normal effort Cardio: S1-S2 is normal regular.  No S3-S4.  No rubs murmurs or bruit GI: Abdomen is soft.  Nontender nondistended.  Bowel sounds are present normal.  No masses organomegaly Extremities: Edema bilateral lower extremities   Lab Results:  Data Reviewed: I have personally reviewed following labs and reports of the imaging studies  CBC: Recent Labs  Lab 01/03/23 0812 01/05/23 0158 01/06/23 0223 01/08/23 0245  WBC 10.5 8.8 9.3 9.2  NEUTROABS  --  4.6 5.4  --   HGB 8.4* 8.3* 8.9* 8.5*  HCT 25.9* 26.3* 28.1* 26.8*  MCV 98.5 100.4* 101.8* 99.3  PLT 390 372 362 865    Basic Metabolic Panel: Recent Labs  Lab 01/04/23 0125 01/05/23 0158 01/06/23 0440 01/07/23 0227 01/08/23 0245  NA 135 136 138 134* 134*  K 4.0 3.9 4.5 4.2 4.6  CL 96* 99 98 97* 98  CO2 '29 26 26 27 26  '$ GLUCOSE 99 114* 93 92 81  BUN 36* 49* 58* 28* 40*  CREATININE 2.43* 3.47* 4.43* 2.45* 3.53*  CALCIUM 9.2 9.7 9.7 9.0 9.6  PHOS 3.4 5.1* 5.8* 3.7 5.1*    GFR: Estimated Creatinine Clearance: 20.9 mL/min (A) (by C-G formula based on SCr of 3.53 mg/dL (H)).  Liver Function Tests: Recent Labs  Lab 01/04/23 0125 01/05/23 0158 01/06/23 0440 01/07/23 0227 01/08/23 0245  ALBUMIN 1.9* 2.0* 1.9* 1.9* 1.9*      CBG: Recent Labs  Lab 01/07/23 0436 01/07/23 0822 01/07/23 2035 01/08/23 0022 01/08/23 0333  GLUCAP 85 96 166* 94 85     Radiology Studies: No results found.     LOS: 26 days   Alison Kubicki [employer] on [email or web address]  01/08/2023, 11:12 AM

## 2023-01-09 DIAGNOSIS — L89159 Pressure ulcer of sacral region, unspecified stage: Secondary | ICD-10-CM | POA: Diagnosis not present

## 2023-01-09 DIAGNOSIS — R630 Anorexia: Secondary | ICD-10-CM | POA: Diagnosis not present

## 2023-01-09 DIAGNOSIS — K811 Chronic cholecystitis: Secondary | ICD-10-CM | POA: Diagnosis not present

## 2023-01-09 LAB — GLUCOSE, CAPILLARY
Glucose-Capillary: 106 mg/dL — ABNORMAL HIGH (ref 70–99)
Glucose-Capillary: 115 mg/dL — ABNORMAL HIGH (ref 70–99)
Glucose-Capillary: 121 mg/dL — ABNORMAL HIGH (ref 70–99)
Glucose-Capillary: 84 mg/dL (ref 70–99)
Glucose-Capillary: 92 mg/dL (ref 70–99)
Glucose-Capillary: 93 mg/dL (ref 70–99)

## 2023-01-09 LAB — RENAL FUNCTION PANEL
Albumin: 2 g/dL — ABNORMAL LOW (ref 3.5–5.0)
Anion gap: 9 (ref 5–15)
BUN: 20 mg/dL (ref 8–23)
CO2: 28 mmol/L (ref 22–32)
Calcium: 9.6 mg/dL (ref 8.9–10.3)
Chloride: 98 mmol/L (ref 98–111)
Creatinine, Ser: 2.38 mg/dL — ABNORMAL HIGH (ref 0.61–1.24)
GFR, Estimated: 27 mL/min — ABNORMAL LOW
Glucose, Bld: 77 mg/dL (ref 70–99)
Phosphorus: 4 mg/dL (ref 2.5–4.6)
Potassium: 4.6 mmol/L (ref 3.5–5.1)
Sodium: 135 mmol/L (ref 135–145)

## 2023-01-09 MED ORDER — RENA-VITE PO TABS: 1.0000 | ORAL_TABLET | Freq: Every day | ORAL | 0 refills | Status: DC

## 2023-01-09 MED ORDER — MEDIHONEY WOUND/BURN DRESSING EX PSTE: 1.0000 | PASTE | Freq: Every day | CUTANEOUS | Status: DC

## 2023-01-09 MED ORDER — GERHARDT'S BUTT CREAM: 1.0000 | TOPICAL_CREAM | Freq: Two times a day (BID) | CUTANEOUS | Status: DC

## 2023-01-09 MED ORDER — POLYETHYLENE GLYCOL 3350 17 G PO PACK: 17.0000 g | PACK | Freq: Every day | ORAL | 0 refills | Status: DC

## 2023-01-09 MED ORDER — SENNOSIDES-DOCUSATE SODIUM 8.6-50 MG PO TABS: 2.0000 | ORAL_TABLET | Freq: Two times a day (BID) | ORAL | Status: DC

## 2023-01-09 MED ORDER — ENSURE ENLIVE PO LIQD: 237.0000 mL | Freq: Three times a day (TID) | ORAL | 12 refills | Status: DC

## 2023-01-09 MED ORDER — PROCHLORPERAZINE MALEATE 5 MG PO TABS: 5.0000 mg | ORAL_TABLET | Freq: Four times a day (QID) | ORAL | 0 refills | Status: DC | PRN

## 2023-01-09 MED ORDER — DARBEPOETIN ALFA 200 MCG/0.4ML IJ SOSY: 200.0000 ug | PREFILLED_SYRINGE | INTRAMUSCULAR | Status: DC

## 2023-01-09 MED ORDER — OXYCODONE HCL 5 MG PO TABS: 2.5000 mg | ORAL_TABLET | ORAL | 0 refills | Status: DC | PRN

## 2023-01-09 MED ORDER — ISOSORBIDE DINITRATE 5 MG PO TABS: 5.0000 mg | ORAL_TABLET | Freq: Two times a day (BID) | ORAL | Status: DC

## 2023-01-09 MED ORDER — ATORVASTATIN CALCIUM 40 MG PO TABS: 40.0000 mg | ORAL_TABLET | Freq: Every day | ORAL | Status: DC

## 2023-01-09 MED ORDER — GABAPENTIN 100 MG PO CAPS: 100.0000 mg | ORAL_CAPSULE | Freq: Three times a day (TID) | ORAL | Status: DC

## 2023-01-09 NOTE — TOC Progression Note (Signed)
Transition of Care [facility]) - Progression Note    Patient Details  Name: [name] MRN: [id number] Date of Birth: 23-May-1946  Transition of Care [facility]) CM/SW Contact  Graves-Bigelow, Ocie Cornfield, RN Phone Number: 01/09/2023, 3:18 PM  Clinical Narrative:  Case Manger received a secure chat regarding the patient and disposition needs.Case Manager discussed with physician and he was agreeable to have Case Manager explore LTAC options. Case Manager called [facility] and they have a waiting list-No HD beds available at this time. [facility] states patient would not be a candidate if the patient cannot tolerate sitting for HD at this time. [facility] has no HD beds for at least 2 weeks. Per Liaison, if a bed becomes available-the liaison can submit to insurance. Case Manager suggested that the [place] RN will benefit being consulted again since the patient cannot tolerate hydrotherapy. Per PT, patient was receiving premedications prior to Hydrotherapy. Maybe alternate pain medications can be provided for Hydrotherapy if this continues to be an option from [facility]. If the patient does not have extensive wound care then [facility]ia[facility] feels that insurance will not approve LTAC. Patient currently does not have extensive wound care, any IV abx, or IV pain meds-all of which insurance looks for to see if the patient is a candidate for LTAC. [facility] will continue to follow once a bed becomes available to see if able to submit to insurance. PT/OT has signed off and will need to be consulted if needed. Palliative will need to weigh in regarding disposition needs. May need to look into Medicaid for this patient, per notes 12/18/22 patient has many assets and not willing to spend down. Case Manager will continue to follow for transition of care needs.   Expected Discharge Plan: [place] Barriers to Discharge: Continued Medical Work up  Expected Discharge Plan and Services In-house Referral: Clinical  Social Work   Post Acute Care Choice: [place] Living arrangements for the past 2 months: [place] Determinants of Health (SDOH) Interventions SDOH Screenings   Food Insecurity: No Food Insecurity (01/05/2023)  Housing: Low Risk  (12/17/2022)  Transportation Needs: No Transportation Needs (12/17/2022)  Utilities: Not At Risk (12/17/2022)  Tobacco Use: Medium Risk (12/20/2022)    Readmission Risk Interventions    09/16/2022   12:31 PM 08/02/2022    2:23 PM  Readmission Risk Prevention Plan  Transportation Screening Complete Complete  Medication Review (RN Care Manager) Complete Complete  HRI or Home Care Consult Complete Complete  SW Recovery Care/Counseling Consult Complete Complete  Palliative Care Screening Not Applicable Not Applicable  Skilled Nursing Facility Not Applicable Complete

## 2023-01-09 NOTE — Progress Notes (Signed)
Calorie Count Note  48 hour calorie count ordered. Day 2 results below:  Diet: regular diet Supplements: Ensure Enlive po TID, PROSource Plus 30 mL po BID  Of note, this was a day pt was on HD.  Breakfast 1/17: 50% omelet, 100% 2 orange juice (243 kcal, 10 grams of protein) Lunch 1/17: 2 cream of chicken soup with saltines (300 kcal, 8 grams of protein) Dinner 1/17: 75% fish, 75% rice with gravy, 75% green beans, 100% juice (286 kcal, 16 grams of protein) Supplements: 1 bottle Boost Plus as unit was out of Ensure, 1 serving PROSource Plus (460 kcal, 29 grams of protein)  Total intake day 2: 1289 kcal (61% of minimum estimated needs)  63 grams protein (60% of minimum estimated needs)  Estimated Nutritional Needs:  Kcal:  2100-2400 Protein:  105-120 grams Fluid:  UOP + 1 L  Nutrition Dx: Moderate Malnutrition related to chronic illness (ESRD on HD, chronic cholecystitis) as evidenced by moderate fat depletion, moderate muscle depletion, percent weight loss.    Goal: Patient will meet greater than or equal to 90% of their needs   Intervention:  -Will extend calorie count one more day. -Continue liberalized, regular diet. Provide 1:1 assistance at meals. -Continue Ensure Enlive po TID, each supplement provides 350 kcal and 20 grams of protein. -Continue PROSource Plus 30 mL po BID, each supplement provides 100 kcal and 15 grams of protein. -Provide Magic cup TID with meals, each supplement provides 290 kcal and 9 grams of protein. -Continue Rena-vite po QHS.  Loanne Drilling, MS, RD, LDN, CNSC Pager number available on Amion

## 2023-01-09 NOTE — Progress Notes (Signed)
[facility] Progress Note   Subjective:    Seen in room. Happy today, smiling -he has a SNF bed at [facility]. He's hoping he'll be discharged soon. Dialysis went well yesterday - no UF. Didn't get dizzy.    Objective Vitals:   01/08/23 1612 01/08/23 2007 01/09/23 0011 01/09/23 0500  BP: 113/65 (!) 113/55 (!) 103/51 115/68  Pulse: 86 78 73 77  Resp: '18 18 18 19  '$ Temp: 97.9 F (36.6 C) 98.3 F (36.8 C) 98.2 F (36.8 C) 98.1 F (36.7 C)  TempSrc: Oral Oral Oral Oral  SpO2: 99% 99% 99% 99%  Weight:      Height:       Physical Exam General: Elderly man, frail, nad  Heart: RRR no MRG Lungs: Clear bilaterally,  nonlabored breathing Abdomen: soft non tender  Extremities: No lower extremity edema  Access: Right IJ [facility]   Filed Weights   01/07/23 0502 01/08/23 0500 01/08/23 0917  Weight: 89.5 kg 90.5 kg 91 kg    Intake/Output Summary (Last 24 hours) at 01/09/2023 0927 Last data filed at 01/09/2023 0600 Gross per 24 hour  Intake --  Output 200 ml  Net -200 ml     Additional Objective Labs: Basic Metabolic Panel: Recent Labs  Lab 01/07/23 0227 01/08/23 0245 01/09/23 0217  NA 134* 134* 135  K 4.2 4.6 4.6  CL 97* 98 98  CO2 '27 26 28  '$ GLUCOSE 92 81 77  BUN 28* 40* 20  CREATININE 2.45* 3.53* 2.38*  CALCIUM 9.0 9.6 9.6  PHOS 3.7 5.1* 4.0    Liver Function Tests: Recent Labs  Lab 01/07/23 0227 01/08/23 0245 01/09/23 0217  ALBUMIN 1.9* 1.9* 2.0*    No results for input(s): "LIPASE", "AMYLASE" in the last 168 hours. CBC: Recent Labs  Lab 01/03/23 0812 01/05/23 0158 01/06/23 0223 01/08/23 0245  WBC 10.5 8.8 9.3 9.2  NEUTROABS  --  4.6 5.4  --   HGB 8.4* 8.3* 8.9* 8.5*  HCT 25.9* 26.3* 28.1* 26.8*  MCV 98.5 100.4* 101.8* 99.3  PLT 390 372 362 353    Blood Culture    Component Value Date/Time   SDES WOUND 12/25/2022 1715   SPECREQUEST NONE 12/25/2022 1715   CULT  12/25/2022 1715    FEW ESCHERICHIA COLI ABUNDANT PROTEUS  MIRABILIS ABUNDANT ENTEROCOCCUS FAECALIS VANCOMYCIN RESISTANT ENTEROCOCCUS    REPTSTATUS 12/29/2022 FINAL 12/25/2022 1715    Cardiac Enzymes: No results for input(s): "CKTOTAL", "CKMB", "CKMBINDEX", "TROPONINI" in the last 168 hours. CBG: Recent Labs  Lab 01/08/23 1611 01/08/23 2005 01/09/23 0009 01/09/23 0505 01/09/23 0829  GLUCAP 102* 142* 92 84 93    Iron Studies: No results for input(s): "IRON", "TIBC", "TRANSFERRIN", "FERRITIN" in the last 72 hours. Lab Results  Component Value Date   INR 1.1 09/13/2022   INR 1.5 (H) 08/20/2022   INR 1.6 (H) 08/19/2022   Studies/Results: No results found.  Medications:  sodium chloride Stopped (01/06/23 0425)    (feeding supplement) PROSource Plus  30 mL Oral BID BM   sodium chloride   Intravenous Once   allopurinol  100 mg Oral Daily   aspirin EC  81 mg Oral Daily   atorvastatin  40 mg Oral Daily   Chlorhexidine Gluconate Cloth  6 each Topical Q0600   cycloSPORINE  1 drop Both Eyes BID   darbepoetin (ARANESP) injection - DIALYSIS  200 mcg Subcutaneous Q Fri-1800   feeding supplement  237 mL Oral TID WC   gabapentin  100 mg  Oral TID   Gerhardt's butt cream   Topical BID   heparin  5,000 Units Subcutaneous Q8H   isosorbide dinitrate  5 mg Oral BID   leptospermum manuka honey  1 Application Topical Daily   melatonin  4.5 mg Oral QHS   multivitamin  1 tablet Oral QHS   polyethylene glycol  17 g Oral Daily   senna-docusate  2 tablet Oral BID   sodium chloride flush  3 mL Intravenous Q12H    Dialysis Orders: [facility] MWF 4h  400/800  84.5kg  2/2.5 bath   Heparin 4000 units  + 3000 mid treatment  [facility] TDC - last OP HD 12/20, post wt 85.9kg - last IP hD 12/24, post wt 85kg - mircera 225 mcg IV q2, last 12/18, due 1/02 - no vdra  Assessment/Plan: ESRD - on HD MWF . He reports history of vertigo and how it worsens on HD. Next HD 1/19 -even UF.  Chronic cholecystitis - w/ nausea, ( now resolved )  malnutrition, FTT.  S/p  laparoscopic cholecystectomy 12/19/2022. No further intervention planned Sacral decubitus ulcer - Per primary. Completed hydrotherapy/course of antibiotics. No surgical intervention currently needed Chronic HFrEF - LVEF up to 50-55% here, diffuse CAD.  HTN/ volume - BP remain soft/stable. Not on any anti-hypertensives. No volume excess. UF as tolerated. Keep SBP >100. Doubt bed weights accurate.  Anemia esrd -Hgb 8.5 S/p 1 unit PRBC, Aranesp 200 q Friday. Tsat low but infection picture and ferritin high. 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 No binder or VDRA currently. On regular diet because of FTT/nutrition DM2 - per pmd   Nutrition -a.m. albumin low 1.9 PCM, FTT. On regular diet + protein supp per RD. To get PEG- updated pall care note reviewed from yesterday- looks like he's still interested in it. [place]- Palliative Care was consulted --Full code/full scope of care.  Dispo - For SNF   [facility] 01/09/2023,9:27 AM

## 2023-01-09 NOTE — Progress Notes (Signed)
Advised by CSW and attending that pt is for possible d/c to snf tomorrow. Staff requesting 1st shift so pt can get to snf in a timely manner. Case also discussed with nephrology staff. Inquired if pt should trial HD in chair prior to d/c to make sure pt can tolerate due to sacral wound. Nephrology staff agreeable to trial HD in the chair tomorrow and to place order. Contacted inpt HD unit to request that pt receive HD 1st shift tomorrow in chair. Contacted [place] to advise clinic that pt is for possible d/c tomorrow and will need to resume on Monday. Will assist as needed.   Melven Sartorius Renal Navigator [phone]

## 2023-01-09 NOTE — TOC Progression Note (Addendum)
Transition of Care [facility]) - Progression Note    Patient Details  Name: [name] MRN: [id number] Date of Birth: 11-03-1946  Transition of Care [facility]) CM/SW [place], [place] Phone Number: 01/09/2023, 12:29 PM  Clinical Narrative:     Update- CSW was informed by PT that patient not able to currently tolerate sitting in chair for HD,  [place] renal navigator informed team that . pt must be able to tolerate HD in the chair to be out-pt appropriate,that there are no local clinics that do HD via a stretcher. CSW informed Melissa with [facility].CSW informed Evelena Peat with [facility] leadership.   CSW informed by MD patient maybe possible dc tomorrow. CSW requested current PT/OT notes. CSW awaiting current PT/OT notes to start insurance authorization for patient. CSW will continue to follow and assist with patients dc planning needs.  Expected Discharge Plan: [place] Barriers to Discharge: Continued Medical Work up  Expected Discharge Plan and Services In-house Referral: Clinical Social Work   Post Acute Care Choice: [place] Living arrangements for the past 2 months: [place] Determinants of Health (SDOH) Interventions SDOH Screenings   Food Insecurity: No Food Insecurity (01/05/2023)  Housing: Low Risk  (12/17/2022)  Transportation Needs: No Transportation Needs (12/17/2022)  Utilities: Not At Risk (12/17/2022)  Tobacco Use: Medium Risk (12/20/2022)    Readmission Risk Interventions    09/16/2022   12:31 PM 08/02/2022    2:23 PM  Readmission Risk Prevention Plan  Transportation Screening Complete Complete  Medication Review (RN Care Manager) Complete Complete  HRI or Home Care Consult Complete Complete  SW Recovery Care/Counseling Consult Complete Complete  Palliative Care Screening Not Applicable Not Applicable  Skilled Nursing Facility Not Applicable Complete

## 2023-01-09 NOTE — Progress Notes (Signed)
[facility] PROGRESS NOTE   [name] BZJ:[id number] DOB: [birth year] DOA: 12/12/2022  PCP: Redmond School, MD  Brief History/Interval Summary: 77 year old male with past medical history of ESRD on hemodialysis Monday Wednesday Friday, HFrEF, diabetes mellitus type 2, hypertension who presented with nausea. Patient has known history of cholecystitis and underwent cholecystostomy tube placement. His tube had become dislodged and he underwent evaluation with surgery. He was noted to have recanalization of the bile duct therefore tube was not replaced. He came to hospital for elective cholecystectomy underwent preop evaluation with cardiology. His workup was notable for diffuse T wave inversions and prolonged QTc. Also was found to be significantly hypokalemic. He was started on electrolyte replacement. Nephrology was consulted for chronic hemodialysis during hospitalization. He was transferred to [facility] for cath and noted to have multivessel diffuse moderate CAD without obvious flow limiting lesion.  He was cleared for surgery and is now post cholecystecomy on 12/28.  Hospitalization c/b presence of sacral decubitus ulcer.  S/p hydrotherapy.  Currently on antibiotics.  Issue now is poor PO intake.  Had NG tube in place briefly.  Watching PO intake at this time to make decision regarding pursuing possible PEG.       Consultants: Nephrology.  Cardiology.  Palliative care    Subjective/Interval History: Patient mentions that he is feeling well.  No complaints offered except that his breakfast was cold this morning.  Appetite is improving.    Assessment/Plan:  Chronic cholecystitis Now status post cholecystectomy.  Stable. Per general surgery; pneumoperitoneum is likely from lap chole done last week  Chronic systolic and diastolic CHF Echocardiogram showed EF to be 50 to 55%.  Previous EF was 35 to 40% in August 2023. Underwent cardiac catheterization which showed multivessel  diffuse moderate CAD without any acute or obvious flow-limiting lesions. Medical management being continued. Noted to be on aspirin, statin, nitrates.  End-stage renal disease Nephrology is following.  Dialyzed on Monday Wednesday Friday schedule.  Constipation Continue bowel regimen.  Anemia of chronic kidney disease No evidence for overt bleeding.  Hemoglobin stable.  Unstageable sacral ulcer -wound culture growing e. Coli resistant to ampicillin/unasyn, e faecalis resistant to vancomycin, pansensitive proteus  -completed course of hydrotherapy -CT with subcutaneous fat straining with complex collection posterior to the sacrum on the left measuring 2.5x1.2cm concerning for abscess, no evidence of osteo - vancomycin 1/3 - 1/5 - cefepime 1/3 - 1/9. E. Coli and proteus treated x 7 days with cefepime.   - flagyl 1/3-1/9  - ampicillin 1/8 - 1/15. E. Faecalis treated with ampicillin complete 1/15.   -sacral MRI could not be done due to the presence of the nerve stimulator -per surgery, no abscess but packing in the place of ulcer. -Patient has been getting hydrotherapy for past 1 week -1/9 woc recommending stop hydrotherapy/dakins -> medihoney to inner wound and outer black wound q day, fill with gauze, cover with foam, change foam q3 days or prn soiling -Continue oxycodone 5 mg every 4 hours as needed -Continue local wound care as per wound care RN recommendation   Diabetes mellitus type 2 A1c 4.4 (likely reflecting his recent failure to thrive, poor PO intake) D/c SSI   Functional Quadriplegia  Essentially bedbound for the past year (needs help toileting, turning in bed, dressing, etc)  Hypophosphatemia -Repleted   Hypomagnesemia -Repleted   Moderate Protein calorie malnutrition  Poor PO intake -BMI 22.98 kg/m -had cortrak to supplement nutrition in setting of his decubitus ulcer, but this was d/c'd  after about 1 week  - Palliative care team and medical team have discussed  indications for PEG with the patient who opts to have this placed if he is unable to maintain nutrition/hydration on his own.  His appetite has improved.  He is tolerating more than 60% of his meals.  In view of this is unlikely that he will need peg tube.    DVT Prophylaxis: Subcutaneous heparin Code Status: Full code Family Communication: Discussed with patient Disposition Plan: SNF hopefully by tomorrow.  Status is: Inpatient Remains inpatient appropriate because: Poor oral intake   Medications: Scheduled:  (feeding supplement) PROSource Plus  30 mL Oral BID BM   sodium chloride   Intravenous Once   allopurinol  100 mg Oral Daily   aspirin EC  81 mg Oral Daily   atorvastatin  40 mg Oral Daily   Chlorhexidine Gluconate Cloth  6 each Topical Q0600   cycloSPORINE  1 drop Both Eyes BID   darbepoetin (ARANESP) injection - DIALYSIS  200 mcg Subcutaneous Q Fri-1800   feeding supplement  237 mL Oral TID WC   gabapentin  100 mg Oral TID   Gerhardt's butt cream   Topical BID   heparin  5,000 Units Subcutaneous Q8H   isosorbide dinitrate  5 mg Oral BID   leptospermum manuka honey  1 Application Topical Daily   melatonin  4.5 mg Oral QHS   multivitamin  1 tablet Oral QHS   polyethylene glycol  17 g Oral Daily   senna-docusate  2 tablet Oral BID   sodium chloride flush  3 mL Intravenous Q12H   Continuous:  sodium chloride Stopped (01/06/23 0425)   DZH:GDJMEQ chloride, acetaminophen, bisacodyl, fentaNYL (SUBLIMAZE) injection, heparin, LORazepam, nitroGLYCERIN, oxyCODONE, polyethylene glycol, polyvinyl alcohol, prochlorperazine, simethicone, sorbitol, traMADol  Antibiotics: Anti-infectives (From admission, onward)    Start     Dose/Rate Route Frequency Ordered Stop   12/30/22 2200  ampicillin (OMNIPEN) 2 g in sodium chloride 0.9 % 100 mL IVPB        2 g 300 mL/hr over 20 Minutes Intravenous Every 12 hours 12/30/22 1519 01/06/23 0445   12/30/22 1145  linezolid (ZYVOX) IVPB 600 mg   Status:  Discontinued        600 mg 300 mL/hr over 60 Minutes Intravenous Every 12 hours 12/30/22 1056 12/30/22 1519   12/28/22 0000  vancomycin (VANCOCIN) IVPB 1000 mg/200 mL premix  Status:  Discontinued        1,000 mg 200 mL/hr over 60 Minutes Intravenous Every M-W-F (Hemodialysis) 12/27/22 1354 12/30/22 1056   12/27/22 1445  vancomycin (VANCOCIN) IVPB 1000 mg/200 mL premix        1,000 mg 200 mL/hr over 60 Minutes Intravenous  Once 12/27/22 1354 12/27/22 1738   12/27/22 1200  vancomycin (VANCOCIN) IVPB 1000 mg/200 mL premix  Status:  Discontinued        1,000 mg 200 mL/hr over 60 Minutes Intravenous Every M-W-F (Hemodialysis) 12/25/22 1831 12/27/22 1354   12/26/22 2100  ceFEPIme (MAXIPIME) 1 g in sodium chloride 0.9 % 100 mL IVPB        1 g 200 mL/hr over 30 Minutes Intravenous Every 24 hours 12/26/22 1052 12/31/22 2034   12/25/22 1900  ceFEPIme (MAXIPIME) 1 g in sodium chloride 0.9 % 100 mL IVPB  Status:  Discontinued        1 g 200 mL/hr over 30 Minutes Intravenous Every 24 hours 12/25/22 1812 12/26/22 1052   12/25/22 1900  vancomycin (VANCOREADY) IVPB 1750  mg/350 mL        1,750 mg 175 mL/hr over 120 Minutes Intravenous  Once 12/25/22 1812 12/26/22 0146   12/25/22 1900  metroNIDAZOLE (FLAGYL) IVPB 500 mg        500 mg 100 mL/hr over 60 Minutes Intravenous Every 12 hours 12/25/22 1812 01/01/23 0700   12/13/22 0600  cefoTEtan (CEFOTAN) 2 g in sodium chloride 0.9 % 100 mL IVPB  Status:  Discontinued        2 g 200 mL/hr over 30 Minutes Intravenous On call to O.R. 12/12/22 1631 12/14/22 0559   12/12/22 0615  cefoTEtan (CEFOTAN) 2 g in sodium chloride 0.9 % 100 mL IVPB  Status:  Discontinued        2 g 200 mL/hr over 30 Minutes Intravenous On call to O.R. 12/12/22 0614 12/12/22 0913       Objective:  Vital Signs  Vitals:   01/08/23 1612 01/08/23 2007 01/09/23 0011 01/09/23 0500  BP: 113/65 (!) 113/55 (!) 103/51 115/68  Pulse: 86 78 73 77  Resp: '18 18 18 19  '$ Temp: 97.9 F  (36.6 C) 98.3 F (36.8 C) 98.2 F (36.8 C) 98.1 F (36.7 C)  TempSrc: Oral Oral Oral Oral  SpO2: 99% 99% 99% 99%  Weight:      Height:        Intake/Output Summary (Last 24 hours) at 01/09/2023 1054 Last data filed at 01/09/2023 0600 Gross per 24 hour  Intake --  Output 200 ml  Net -200 ml    Filed Weights   01/07/23 0502 01/08/23 0500 01/08/23 0917  Weight: 89.5 kg 90.5 kg 91 kg    General appearance: Awake alert.  In no distress Resp: Clear to auscultation bilaterally.  Normal effort Cardio: S1-S2 is normal regular.  No S3-S4.  No rubs murmurs or bruit GI: Abdomen is soft.  Nontender nondistended.  Bowel sounds are present normal.  No masses organomegaly    Lab Results:  Data Reviewed: I have personally reviewed following labs and reports of the imaging studies  CBC: Recent Labs  Lab 01/03/23 0812 01/05/23 0158 01/06/23 0223 01/08/23 0245  WBC 10.5 8.8 9.3 9.2  NEUTROABS  --  4.6 5.4  --   HGB 8.4* 8.3* 8.9* 8.5*  HCT 25.9* 26.3* 28.1* 26.8*  MCV 98.5 100.4* 101.8* 99.3  PLT 390 372 362 353     Basic Metabolic Panel: Recent Labs  Lab 01/05/23 0158 01/06/23 0440 01/07/23 0227 01/08/23 0245 01/09/23 0217  NA 136 138 134* 134* 135  K 3.9 4.5 4.2 4.6 4.6  CL 99 98 97* 98 98  CO2 '26 26 27 26 28  '$ GLUCOSE 114* 93 92 81 77  BUN 49* 58* 28* 40* 20  CREATININE 3.47* 4.43* 2.45* 3.53* 2.38*  CALCIUM 9.7 9.7 9.0 9.6 9.6  PHOS 5.1* 5.8* 3.7 5.1* 4.0     GFR: Estimated Creatinine Clearance: 31.1 mL/min (A) (by C-G formula based on SCr of 2.38 mg/dL (H)).  Liver Function Tests: Recent Labs  Lab 01/05/23 0158 01/06/23 0440 01/07/23 0227 01/08/23 0245 01/09/23 0217  ALBUMIN 2.0* 1.9* 1.9* 1.9* 2.0*       CBG: Recent Labs  Lab 01/08/23 1611 01/08/23 2005 01/09/23 0009 01/09/23 0505 01/09/23 0829  GLUCAP 102* 142* 92 84 93      Radiology Studies: No results found.     LOS: 27 days   Lashauna Arpin [employer] on  [email or web address]  01/09/2023, 10:54 AM

## 2023-01-10 DIAGNOSIS — L89159 Pressure ulcer of sacral region, unspecified stage: Secondary | ICD-10-CM | POA: Diagnosis not present

## 2023-01-10 DIAGNOSIS — R630 Anorexia: Secondary | ICD-10-CM | POA: Diagnosis not present

## 2023-01-10 DIAGNOSIS — K811 Chronic cholecystitis: Secondary | ICD-10-CM | POA: Diagnosis not present

## 2023-01-10 LAB — CBC
HCT: 26.5 % — ABNORMAL LOW (ref 39.0–52.0)
Hemoglobin: 8.3 g/dL — ABNORMAL LOW (ref 13.0–17.0)
MCH: 31.7 pg (ref 26.0–34.0)
MCHC: 31.3 g/dL (ref 30.0–36.0)
MCV: 101.1 fL — ABNORMAL HIGH (ref 80.0–100.0)
Platelets: 380 10*3/uL (ref 150–400)
RBC: 2.62 MIL/uL — ABNORMAL LOW (ref 4.22–5.81)
RDW: 19.6 % — ABNORMAL HIGH (ref 11.5–15.5)
WBC: 9.4 10*3/uL (ref 4.0–10.5)
nRBC: 0 % (ref 0.0–0.2)

## 2023-01-10 LAB — RENAL FUNCTION PANEL
Albumin: 1.9 g/dL — ABNORMAL LOW (ref 3.5–5.0)
Anion gap: 8 (ref 5–15)
BUN: 33 mg/dL — ABNORMAL HIGH (ref 8–23)
CO2: 28 mmol/L (ref 22–32)
Calcium: 9.9 mg/dL (ref 8.9–10.3)
Chloride: 100 mmol/L (ref 98–111)
Creatinine, Ser: 3.48 mg/dL — ABNORMAL HIGH (ref 0.61–1.24)
GFR, Estimated: 17 mL/min — ABNORMAL LOW (ref >60)
Glucose, Bld: 84 mg/dL (ref 70–99)
Phosphorus: 5.3 mg/dL — ABNORMAL HIGH (ref 2.5–4.6)
Potassium: 4.5 mmol/L (ref 3.5–5.1)
Sodium: 136 mmol/L (ref 135–145)

## 2023-01-10 LAB — GLUCOSE, CAPILLARY
Glucose-Capillary: 80 mg/dL (ref 70–99)
Glucose-Capillary: 121 mg/dL — ABNORMAL HIGH (ref 70–99)
Glucose-Capillary: 85 mg/dL (ref 70–99)

## 2023-01-10 MED ORDER — HEPARIN SODIUM (PORCINE) 1000 UNIT/ML IJ SOLN: 3000.0000 [IU] | Freq: Once | INTRAMUSCULAR | Status: DC

## 2023-01-10 MED ORDER — PENTAFLUOROPROP-TETRAFLUOROETH EX AERO: 1.0000 | INHALATION_SPRAY | CUTANEOUS | Status: DC | PRN

## 2023-01-10 MED ORDER — ALTEPLASE 2 MG IJ SOLR: 2.0000 mg | Freq: Once | INTRAMUSCULAR | Status: DC | PRN

## 2023-01-10 MED ORDER — LIDOCAINE-PRILOCAINE 2.5-2.5 % EX CREA: 1.0000 | TOPICAL_CREAM | CUTANEOUS | Status: DC | PRN

## 2023-01-10 MED ORDER — LIDOCAINE HCL (PF) 1 % IJ SOLN: 5.0000 mL | INTRAMUSCULAR | Status: DC | PRN

## 2023-01-10 MED ORDER — ANTICOAGULANT SODIUM CITRATE 4% (200MG/5ML) IV SOLN: 5.0000 mL | Status: DC | PRN

## 2023-01-10 MED ORDER — HEPARIN SODIUM (PORCINE) 1000 UNIT/ML DIALYSIS
4000.0000 [IU] | Freq: Once | INTRAMUSCULAR | Status: AC
  Administered 2023-01-10: 4000 [IU] via INTRAVENOUS_CENTRAL
  Filled 2023-01-10 (×2): qty 4

## 2023-01-10 NOTE — Progress Notes (Signed)
Nutrition Follow-up  DOCUMENTATION CODES:   Non-severe (moderate) malnutrition in context of chronic illness  INTERVENTION:  Discontinued calorie count.  Discussed with MD via secure chat. With improving appetite and intake, no longer considering PEG tube for patient. Pt reports he plans to continue to work on eating well at meals and taking supplements.  Continue liberalized, regular diet. Provide 1:1 assistance at meals.  Continue Ensure Enlive po TID, each supplement provides 350 kcal and 20 grams of protein.  Continue PROSource Plus 30 mL po BID, each supplement provides 100 kcal and 15 grams of protein.  Provide Magic cup BID with lunch and dinner, each supplement provides 290 kcal and 9 grams of protein.  Continue Rena-vite po QHS.  NUTRITION DIAGNOSIS:   Moderate Malnutrition related to chronic illness (ESRD on HD, chronic cholecystitis) as evidenced by moderate fat depletion, moderate muscle depletion, percent weight loss.  Ongoing.  GOAL:   Patient will meet greater than or equal to 90% of their needs  Met with interventions.  MONITOR:   PO intake, Supplement acceptance, Labs, Weight trends, TF tolerance, I & O's, Skin  REASON FOR ASSESSMENT:   Malnutrition Screening Tool    ASSESSMENT:   77 year old male with PMHx of ESRD on HD M/W/F, HFrEF, DM type 2, HTN, chronic cholecystitis s/p cholecystostomy tube placement that was later dislodged who presented with nausea. Pt underwent cholecystectomy on 12/28 with intraoperative cholangiogram. Pt also with unstageable sacral ulcer that progressed as compared to previous imaging 12/21; no debridement recommended by general surgery.  12/28: s/p laparoscopic cholecystectomy with intraoperative ICG cholangiography   1/5: Cortrak tube placed and nocturnal tube feeds initiated 1/11: Cortrak tube removed  1/13: Palliative Medicine consult - pt is agreeable to PEG placement if he is unable to maintain nutrition on his  own  Plan is for pt to undergo dialysis today. Plan is to see if he can tolerate HD in chair.  Met with pt at bedside. He reports his appetite continues to improve. He is motivated to eat well at meals so he can meet kcal/protein needs for wound healing and avoid PEG tube. Completed calorie count 3 days this week. Pt improved PO intake and in the past 24 hours meeting 95% kcal needs and 100% protein needs. Pt had not received breakfast tray at time of RD assessment but reported he was looking forward to breakfast. Denies nausea or abdominal pain. Reports pain at wound.  Pt was 98.1 kg on 08/23/22. He was 83.4 kg on 12/25/22. He lost 14.7 kg or 15% wt over 4 months, which is significant for time frame. Most recent wt is 91 kg, which may be falsely elevated from fluid. Dry wt is 84.5 kg per Nephrology note. Will continue to monitor trends.  UOP: 200 mL (0.1 mL/kg/hr)  I/O: +6763.4 mL since 12/27/22  Medications reviewed and include: PROSource Plus 30 mL BID, allopurinol, Ensure Enlive, gabapentin, Rena-vite, Miralax, senna-docusate  Labs reviewed: CBG 80-121, BUN 33, Creatinine 3.48  Discussed with MD via secure chat. As appetite/intake improving, no longer considering PEG tube placement at this time. Per review of chart plan was for discharge to SNF today. Team now looking into LTAC placement.  Diet Order:   Diet Order             Diet regular Room service appropriate? Yes; Fluid consistency: Thin  Diet effective now                  EDUCATION NEEDS:  Not appropriate for education at this time  Skin:  Skin Assessment: Skin Integrity Issues: Skin Integrity Issues:: Stage IV, Incisions Stage IV: initially unstageable now evolved to stage 4 to sacrum (4 cm x 3 cm x 1.5 cm); wound has improved and no longer requires hydrotherapy or Dakins Unstageable: N/A Incisions: closed incision to abdomen  Last BM:  01/09/23 - large type 6  Height:   Ht Readings from Last 1 Encounters:  12/17/22  '6\' 3"'$  (1.905 m)   Weight:   Wt Readings from Last 1 Encounters:  01/08/23 91 kg   Ideal Body Weight:  89.1 kg  BMI:  Body mass index is 25.07 kg/m.  Estimated Nutritional Needs:   Kcal:  2100-2400  Protein:  105-120 grams  Fluid:  UOP + 1 L  Colena Ketterman Magda Paganini, MS, RD, LDN, CNSC Pager number available on Amion

## 2023-01-10 NOTE — Progress Notes (Signed)
Pt back to the unit, tolerated HD tx well on the chair as per HD RN.

## 2023-01-10 NOTE — Progress Notes (Signed)
[facility] Progress Note   Subjective:   Patient seen and examined at bedside.  Wound care nurse re-evaluating wound this morning.  Patient states he is feeling ok.  Denies SOB, CP and n/v/d.  States appetite is ok.  Denies pain other than in his wound.  Per notes PT informed CSW that patient can not tolerate HD in chair.  Patient states he has not tried HD in chair and would like to try to see if he can tolerate.  Discussed he would need to be able to sit in a chair for a minimum of [id number] to replicate the OP setting and patient agreed.    Objective Vitals:   01/09/23 2044 01/10/23 0015 01/10/23 0455 01/10/23 0810  BP: (!) 103/57 (!) 103/51 126/68 (!) 129/58  Pulse: 89 80  73  Resp: (!) '21 19 15 14  '$ Temp: 98.1 F (36.7 C)  98.5 F (36.9 C) 98.6 F (37 C)  TempSrc: Oral  Oral Oral  SpO2: 100% 95% 95% 98%  Weight:      Height:       Physical Exam General:thin, frail, elderly male in NAD Heart:RRR, no mrg Lungs:CTAB, nml WOB Abdomen:soft, NTND Extremities:trace LE edema Dialysis Access: [facility] c/d/i   Filed Weights   01/07/23 0502 01/08/23 0500 01/08/23 0917  Weight: 89.5 kg 90.5 kg 91 kg    Intake/Output Summary (Last 24 hours) at 01/10/2023 0826 Last data filed at 01/10/2023 0500 Gross per 24 hour  Intake --  Output 200 ml  Net -200 ml    Additional Objective Labs: Basic Metabolic Panel: Recent Labs  Lab 01/08/23 0245 01/09/23 0217 01/10/23 0314  NA 134* 135 136  K 4.6 4.6 4.5  CL 98 98 100  CO2 '26 28 28  '$ GLUCOSE 81 77 84  BUN 40* 20 33*  CREATININE 3.53* 2.38* 3.48*  CALCIUM 9.6 9.6 9.9  PHOS 5.1* 4.0 5.3*   Liver Function Tests: Recent Labs  Lab 01/08/23 0245 01/09/23 0217 01/10/23 0314  ALBUMIN 1.9* 2.0* 1.9*   CBC: Recent Labs  Lab 01/05/23 0158 01/06/23 0223 01/08/23 0245  WBC 8.8 9.3 9.2  NEUTROABS 4.6 5.4  --   HGB 8.3* 8.9* 8.5*  HCT 26.3* 28.1* 26.8*  MCV 100.4* 101.8* 99.3  PLT 372 362 353   CBG: Recent Labs  Lab  01/09/23 0829 01/09/23 1212 01/09/23 1630 01/09/23 2130 01/10/23 0808  GLUCAP 93 115* 106* 121* 80    Medications:  sodium chloride Stopped (01/06/23 0425)    (feeding supplement) PROSource Plus  30 mL Oral BID BM   sodium chloride   Intravenous Once   allopurinol  100 mg Oral Daily   aspirin EC  81 mg Oral Daily   atorvastatin  40 mg Oral Daily   Chlorhexidine Gluconate Cloth  6 each Topical Q0600   cycloSPORINE  1 drop Both Eyes BID   darbepoetin (ARANESP) injection - DIALYSIS  200 mcg Subcutaneous Q Fri-1800   feeding supplement  237 mL Oral TID WC   gabapentin  100 mg Oral TID   Gerhardt's butt cream   Topical BID   heparin  5,000 Units Subcutaneous Q8H   isosorbide dinitrate  5 mg Oral BID   leptospermum manuka honey  1 Application Topical Daily   melatonin  4.5 mg Oral QHS   multivitamin  1 tablet Oral QHS   polyethylene glycol  17 g Oral Daily   senna-docusate  2 tablet Oral BID   sodium chloride flush  3  mL Intravenous Q12H    Dialysis Orders: [facility] MWF 4h  400/800  84.5kg  2/2.5 bath   Heparin 4000 units  + 3000 mid treatment  [facility] - last OP HD 12/20, post wt 85.9kg - last IP hD 12/24, post wt 85kg - mircera 225 mcg IV q2, last 12/18, due 1/02 - no vdra   Assessment/Plan: ESRD - on HD MWF . He reports history of vertigo and how it worsens on HD. Next HD 1/19 -even UF.  Per PT unable to tolerate dialysis in a chair due to sacral wound.  Patient would like to try today.  If unable to tolerate will not be appropriate for outpatient dialysis.  Chronic cholecystitis - w/ nausea, ( now resolved )  malnutrition, FTT.  S/p laparoscopic cholecystectomy 12/19/2022. No further intervention planned Sacral decubitus ulcer - Per primary. Completed hydrotherapy/course of antibiotics. No surgical intervention currently needed. WOC re-revaluating today.  Per PT pain from wound preventing patient from sitting in chair for dialysis.   Chronic HFrEF - LVEF up to 50-55% here,  diffuse CAD.  HTN/ volume - BP remain soft/stable. Not on any anti-hypertensives. No volume excess. UF as tolerated. Keep SBP >100. Bed weights up, doubt accuracy.  Anemia esrd -Last Hgb 8.5 S/p 1 unit PRBC, Aranesp 200 q Friday. Tsat low, ferritin high.  Hold IV iron for now.  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 No binder or VDRA currently. On regular diet because of FTT/nutrition DM2 - per pmd   Nutrition -a.m. albumin low 1.9 PCM, FTT. On regular diet + protein supp per RD. Appetite improved and plans for PEG cancelled.  [place]- Palliative Care was consulted --Full code/full scope of care.  Dispo - Planned for SNF but now unsure if appropriate for outpatient HD.  No stretcher dialysis in this area.  May need LTAC.    Jen Mow, PA-C [facility] 01/10/2023,8:26 AM  LOS: 28 days

## 2023-01-10 NOTE — Progress Notes (Signed)
Received patient in bed to unit.  Alert and oriented.  Informed consent signed and in chart.   Treatment initiated: 15:08 Treatment completed: 18:38  Patient tolerated well.  Transported back to the room  Alert, without acute distress.  Hand-off given to patient's nurse.   Access used: right [facility] Access issues: none  Total UF removed: 0 Medication(s) given: oxy '5mg'$     01/10/23 1838  Vitals  Temp 98.7 F (37.1 C)  Temp Source Oral  BP Location Right Arm  BP Method Automatic  Patient Position (if appropriate) Sitting  Pulse Rate Source Monitor  Oxygen Therapy  O2 Device Room Air  Patient Activity (if Appropriate) In bed  Pulse Oximetry Type Continuous  During Treatment Monitoring  Blood Flow Rate (mL/min) 400 mL/min  Arterial Pressure (mmHg) -200 mmHg  Venous Pressure (mmHg) 200 mmHg  TMP (mmHg) -3 mmHg  Ultrafiltration Rate (mL/min) 166 mL/min  Dialysate Flow Rate (mL/min) 300 ml/min  HD Safety Checks Performed Yes  Intra-Hemodialysis Comments Tx completed  Dialysis Fluid Bolus Normal Saline  Post Treatment  Dialyzer Clearance Lightly streaked  Duration of HD Treatment -hour(s) 3.5 hour(s)  Liters Processed 84  Fluid Removed (mL) 0 mL  Tolerated HD Treatment Yes  Hemodialysis Catheter Right Subclavian  Placement Date: 08/06/22   Placed prior to admission: Yes  Orientation: Right  Access Location: Subclavian  Site Condition No complications  Blue Lumen Status Flushed;Heparin locked  Red Lumen Status Flushed;Heparin locked  Purple Lumen Status N/A  Catheter fill solution Heparin 1000 units/ml  Catheter fill volume (Arterial) 1.9 cc  Catheter fill volume (Venous) 1.9  Dressing Type Transparent  Dressing Status Antimicrobial disc in place  Drainage Description None  Post treatment catheter status Capped and Clamped      Lilliane Sposito S [facility]

## 2023-01-10 NOTE — Progress Notes (Signed)
Following pt's case for out-pt HD needs. Team advised yesterday by PT that pt will not be able to tolerate HD in the chair due to wound. Team advised that pt will need to tolerate HD in the chair in order to be appropriate for out-pt HD. Providers discussed this with pt and pt would like to trial HD in the chair to see if he can tolerate. Clinic advised pt did not d/c today due to the above. There are no local clinics that can accommodate stretcher HD. Will assist as needed.   Melven Sartorius Renal Navigator [phone]

## 2023-01-10 NOTE — Consult Note (Signed)
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  Top with dry dressing.    Patient on low air loss mattress for moisture management and pressure redistribution.    WOC will not follow at this time.  Re-consult if needed.   Thank you,   Vernal Rutan MSN, RN-BC, [employer]

## 2023-01-10 NOTE — Progress Notes (Signed)
Calorie Count Note  Calorie count ordered. Day 3 results below:  Diet: regular diet Supplements: Ensure Enlive po TID, PROSource Plus 30 mL po BID, Magic Cup po TID  Breakfast 1/18: 75% 2 servings eggs with cheese and 2 slices bacon (711 kcal, 25 grams of protein) Lunch 1/18: 100% potato soup (148 kcal, 4 grams of protein) Dinner 1/18: 50% chicken with gravy, rice, green peas, dinner roll (228 kcal, 11 grams of protein) Supplements: 1 Boost Plus, 2 Ensure Plus High Protein, 2 PROSource Plus 30 mL  Total intake Day 3: 1994 kcal (95% of minimum estimated needs)  124 grams protein (>100% of minimum estimated needs)  Estimated Nutritional Needs:  Kcal:  2100-2400 Protein:  105-120 grams Fluid:  UOP + 1 L  Nutrition Dx: Moderate Malnutrition related to chronic illness (ESRD on HD, chronic cholecystitis) as evidenced by moderate fat depletion, moderate muscle depletion, percent weight loss.    Goal: Patient will meet greater than or equal to 90% of their needs   Intervention:  See follow-up note from 01/10/23 for updated intervention.  Loanne Drilling, MS, RD, LDN, CNSC Pager number available on Amion

## 2023-01-10 NOTE — Progress Notes (Signed)
[facility] PROGRESS NOTE   [name] JHE:[id number] DOB: 1946/12/08 DOA: 12/12/2022  PCP: Redmond School, MD  Brief History/Interval Summary: 77 year old male with past medical history of ESRD on hemodialysis Monday Wednesday Friday, HFrEF, diabetes mellitus type 2, hypertension who presented with nausea. Patient has known history of cholecystitis and underwent cholecystostomy tube placement. His tube had become dislodged and he underwent evaluation with surgery. He was noted to have recanalization of the bile duct therefore tube was not replaced. He came to hospital for elective cholecystectomy underwent preop evaluation with cardiology. His workup was notable for diffuse T wave inversions and prolonged QTc. Also was found to be significantly hypokalemic. He was started on electrolyte replacement. Nephrology was consulted for chronic hemodialysis during hospitalization. He was transferred to [facility] for cath and noted to have multivessel diffuse moderate CAD without obvious flow limiting lesion.  He was cleared for surgery and is now post cholecystecomy on 12/28.  Hospitalization c/b presence of sacral decubitus ulcer.  S/p hydrotherapy.  Currently on antibiotics.  Issue now is poor PO intake.  Had NG tube in place briefly.  Watching PO intake at this time to make decision regarding pursuing possible PEG.       Consultants: Nephrology.  Cardiology.  Palliative care    Subjective/Interval History: Patient frustrated by the delay in going to rehab.  He understands that this is primarily because of difficulty arranging outpatient dialysis as he needs to be able to sit for several hours which has been difficult to do due to his significant sacral wounds.  He mentioned that he wants to try sitting on dialysis today.  It looks like nephrology is planning to try and have him sit during HD today.   Assessment/Plan:  Chronic cholecystitis Now status post cholecystectomy.  Stable. Per  general surgery; pneumoperitoneum is likely from lap chole done last week  Chronic systolic and diastolic CHF Echocardiogram showed EF to be 50 to 55%.  Previous EF was 35 to 40% in August 2023. Underwent cardiac catheterization which showed multivessel diffuse moderate CAD without any acute or obvious flow-limiting lesions. Medical management being continued. Noted to be on aspirin, statin, nitrates. To be stable from cardiac standpoint.  End-stage renal disease Nephrology is following.  Dialyzed on Monday Wednesday Friday schedule.  Constipation Continue bowel regimen.  Anemia of chronic kidney disease No evidence for overt bleeding.  Hemoglobin stable.  Stage IV sacral decubitus -wound culture growing e. Coli resistant to ampicillin/unasyn, e faecalis resistant to vancomycin, pansensitive proteus  -completed course of hydrotherapy -CT with subcutaneous fat straining with complex collection posterior to the sacrum on the left measuring 2.5x1.2cm concerning for abscess, no evidence of osteo - vancomycin 1/3 - 1/5 - cefepime 1/3 - 1/9. E. Coli and proteus treated x 7 days with cefepime.   - flagyl 1/3-1/9  - ampicillin 1/8 - 1/15. E. Faecalis treated with ampicillin complete 1/15.   -sacral MRI could not be done due to the presence of the nerve stimulator -per surgery, no abscess but packing in the place of ulcer. -Patient has been getting hydrotherapy for past 1 week -1/9 woc recommending stop hydrotherapy/dakins -> medihoney to inner wound and outer black wound q day, fill with gauze, cover with foam, change foam q3 days or prn soiling -Continue oxycodone 5 mg every 4 hours as needed -Continue local wound care as per wound care RN recommendation   Diabetes mellitus type 2 A1c 4.4 (likely reflecting his recent failure to thrive, poor PO intake)  D/c SSI   Functional Quadriplegia  Essentially bedbound for the past year (needs help toileting, turning in bed, dressing,  etc).  Hypophosphatemia -Repleted   Hypomagnesemia -Repleted   Moderate Protein calorie malnutrition  Poor PO intake -BMI 22.98 kg/m -had cortrak to supplement nutrition in setting of his decubitus ulcer, but this was d/c'd after about 1 week  Oral intake has significantly improved.  No need for PEG tube at this time.  DVT Prophylaxis: Subcutaneous heparin Code Status: Full code Family Communication: Discussed with patient Disposition Plan: He needs to sit for his dialysis session before he can go to SNF as he will otherwise not be accepted into any of the outpatient HD clinics.  Status is: Inpatient Remains inpatient appropriate because: Poor oral intake   Medications: Scheduled:  (feeding supplement) PROSource Plus  30 mL Oral BID BM   sodium chloride   Intravenous Once   allopurinol  100 mg Oral Daily   aspirin EC  81 mg Oral Daily   atorvastatin  40 mg Oral Daily   Chlorhexidine Gluconate Cloth  6 each Topical Q0600   cycloSPORINE  1 drop Both Eyes BID   darbepoetin (ARANESP) injection - DIALYSIS  200 mcg Subcutaneous Q Fri-1800   feeding supplement  237 mL Oral TID WC   gabapentin  100 mg Oral TID   Gerhardt's butt cream   Topical BID   heparin  4,000 Units Dialysis Once in dialysis   heparin  5,000 Units Subcutaneous Q8H   isosorbide dinitrate  5 mg Oral BID   melatonin  4.5 mg Oral QHS   multivitamin  1 tablet Oral QHS   polyethylene glycol  17 g Oral Daily   senna-docusate  2 tablet Oral BID   sodium chloride flush  3 mL Intravenous Q12H   Continuous:  sodium chloride Stopped (01/06/23 0425)   anticoagulant sodium citrate     NLG:XQJJHE chloride, acetaminophen, alteplase, anticoagulant sodium citrate, bisacodyl, fentaNYL (SUBLIMAZE) injection, heparin, lidocaine (PF), lidocaine-prilocaine, LORazepam, nitroGLYCERIN, oxyCODONE, pentafluoroprop-tetrafluoroeth, polyethylene glycol, polyvinyl alcohol, prochlorperazine, simethicone, sorbitol,  traMADol  Antibiotics: Anti-infectives (From admission, onward)    Start     Dose/Rate Route Frequency Ordered Stop   12/30/22 2200  ampicillin (OMNIPEN) 2 g in sodium chloride 0.9 % 100 mL IVPB        2 g 300 mL/hr over 20 Minutes Intravenous Every 12 hours 12/30/22 1519 01/06/23 0445   12/30/22 1145  linezolid (ZYVOX) IVPB 600 mg  Status:  Discontinued        600 mg 300 mL/hr over 60 Minutes Intravenous Every 12 hours 12/30/22 1056 12/30/22 1519   12/28/22 0000  vancomycin (VANCOCIN) IVPB 1000 mg/200 mL premix  Status:  Discontinued        1,000 mg 200 mL/hr over 60 Minutes Intravenous Every M-W-F (Hemodialysis) 12/27/22 1354 12/30/22 1056   12/27/22 1445  vancomycin (VANCOCIN) IVPB 1000 mg/200 mL premix        1,000 mg 200 mL/hr over 60 Minutes Intravenous  Once 12/27/22 1354 12/27/22 1738   12/27/22 1200  vancomycin (VANCOCIN) IVPB 1000 mg/200 mL premix  Status:  Discontinued        1,000 mg 200 mL/hr over 60 Minutes Intravenous Every M-W-F (Hemodialysis) 12/25/22 1831 12/27/22 1354   12/26/22 2100  ceFEPIme (MAXIPIME) 1 g in sodium chloride 0.9 % 100 mL IVPB        1 g 200 mL/hr over 30 Minutes Intravenous Every 24 hours 12/26/22 1052 12/31/22 2034   12/25/22 1900  ceFEPIme (MAXIPIME) 1 g in sodium chloride 0.9 % 100 mL IVPB  Status:  Discontinued        1 g 200 mL/hr over 30 Minutes Intravenous Every 24 hours 12/25/22 1812 12/26/22 1052   12/25/22 1900  vancomycin (VANCOREADY) IVPB 1750 mg/350 mL        1,750 mg 175 mL/hr over 120 Minutes Intravenous  Once 12/25/22 1812 12/26/22 0146   12/25/22 1900  metroNIDAZOLE (FLAGYL) IVPB 500 mg        500 mg 100 mL/hr over 60 Minutes Intravenous Every 12 hours 12/25/22 1812 01/01/23 0700   12/13/22 0600  cefoTEtan (CEFOTAN) 2 g in sodium chloride 0.9 % 100 mL IVPB  Status:  Discontinued        2 g 200 mL/hr over 30 Minutes Intravenous On call to O.R. 12/12/22 1631 12/14/22 0559   12/12/22 0615  cefoTEtan (CEFOTAN) 2 g in sodium  chloride 0.9 % 100 mL IVPB  Status:  Discontinued        2 g 200 mL/hr over 30 Minutes Intravenous On call to O.R. 12/12/22 0614 12/12/22 0913       Objective:  Vital Signs  Vitals:   01/10/23 0015 01/10/23 0455 01/10/23 0810 01/10/23 1034  BP: (!) 103/51 126/68 (!) 129/58   Pulse: 80  73 87  Resp: '19 15 14 16  '$ Temp:  98.5 F (36.9 C) 98.6 F (37 C)   TempSrc:  Oral Oral   SpO2: 95% 95% 98% 97%  Weight:      Height:        Intake/Output Summary (Last 24 hours) at 01/10/2023 1114 Last data filed at 01/10/2023 0500 Gross per 24 hour  Intake --  Output 200 ml  Net -200 ml    Filed Weights   01/07/23 0502 01/08/23 0500 01/08/23 0917  Weight: 89.5 kg 90.5 kg 91 kg   General appearance: Awake alert.  In no distress Resp: Clear to auscultation bilaterally.  Normal effort Cardio: S1-S2 is normal regular.  No S3-S4.  No rubs murmurs or bruit GI: Abdomen is soft.  Nontender nondistended.  Bowel sounds are present normal.  No masses organomegaly   Lab Results:  Data Reviewed: I have personally reviewed following labs and reports of the imaging studies  CBC: Recent Labs  Lab 01/05/23 0158 01/06/23 0223 01/08/23 0245  WBC 8.8 9.3 9.2  NEUTROABS 4.6 5.4  --   HGB 8.3* 8.9* 8.5*  HCT 26.3* 28.1* 26.8*  MCV 100.4* 101.8* 99.3  PLT 372 362 353     Basic Metabolic Panel: Recent Labs  Lab 01/06/23 0440 01/07/23 0227 01/08/23 0245 01/09/23 0217 01/10/23 0314  NA 138 134* 134* 135 136  K 4.5 4.2 4.6 4.6 4.5  CL 98 97* 98 98 100  CO2 '26 27 26 28 28  '$ GLUCOSE 93 92 81 77 84  BUN 58* 28* 40* 20 33*  CREATININE 4.43* 2.45* 3.53* 2.38* 3.48*  CALCIUM 9.7 9.0 9.6 9.6 9.9  PHOS 5.8* 3.7 5.1* 4.0 5.3*     GFR: Estimated Creatinine Clearance: 21.2 mL/min (A) (by C-G formula based on SCr of 3.48 mg/dL (H)).  Liver Function Tests: Recent Labs  Lab 01/06/23 0440 01/07/23 0227 01/08/23 0245 01/09/23 0217 01/10/23 0314  ALBUMIN 1.9* 1.9* 1.9* 2.0* 1.9*        CBG: Recent Labs  Lab 01/09/23 0829 01/09/23 1212 01/09/23 1630 01/09/23 2130 01/10/23 0808  GLUCAP 93 115* 106* 121* 80      Radiology Studies:  No results found.     LOS: 28 days   Hilbert Briggs [employer] on [email or web address]  01/10/2023, 11:14 AM

## 2023-01-11 DIAGNOSIS — L89150 Pressure ulcer of sacral region, unstageable: Secondary | ICD-10-CM | POA: Diagnosis not present

## 2023-01-11 LAB — RENAL FUNCTION PANEL
Albumin: 2 g/dL — ABNORMAL LOW (ref 3.5–5.0)
Anion gap: 9 (ref 5–15)
BUN: 20 mg/dL (ref 8–23)
CO2: 27 mmol/L (ref 22–32)
Calcium: 9.3 mg/dL (ref 8.9–10.3)
Chloride: 98 mmol/L (ref 98–111)
Creatinine, Ser: 2.28 mg/dL — ABNORMAL HIGH (ref 0.61–1.24)
GFR, Estimated: 29 mL/min — ABNORMAL LOW (ref >60)
Glucose, Bld: 89 mg/dL (ref 70–99)
Phosphorus: 3.7 mg/dL (ref 2.5–4.6)
Potassium: 4 mmol/L (ref 3.5–5.1)
Sodium: 134 mmol/L — ABNORMAL LOW (ref 135–145)

## 2023-01-11 LAB — GLUCOSE, CAPILLARY
Glucose-Capillary: 89 mg/dL (ref 70–99)
Glucose-Capillary: 101 mg/dL — ABNORMAL HIGH (ref 70–99)
Glucose-Capillary: 112 mg/dL — ABNORMAL HIGH (ref 70–99)
Glucose-Capillary: 126 mg/dL — ABNORMAL HIGH (ref 70–99)

## 2023-01-11 NOTE — Progress Notes (Signed)
[facility] Progress Note   Subjective:   Patient seen and examined at bedside.  Tolerated dialysis well yesterday in a chair.  Did receive '5mg'$  Oxy during treatment.  Discussed this would not be available at the outpatient center.  Reports being sore this AM but no worse than usual.  Would benefit from having designated cushion to use during dialysis to help with off loading pressure on wound.   Denies CP, SOB, abdominal pain and n/v/d.  Objective Vitals:   01/10/23 1838 01/10/23 2025 01/11/23 0500 01/11/23 0517  BP:  116/89  (!) 127/59  Pulse:  85  85  Resp:  12  16  Temp: 98.7 F (37.1 C) 97.9 F (36.6 C)  98.5 F (36.9 C)  TempSrc: Oral Oral  Oral  SpO2:  95%  95%  Weight:   91 kg   Height:       Physical Exam General:thin, frail, elderly male in NAD Heart:RRR, no mrg Lungs:CTAB, nml WOB on RA Abdomen:soft, NTND Extremities:no LE edema Dialysis Access: [facility] c/d/i   Filed Weights   01/08/23 0500 01/08/23 0917 01/11/23 0500  Weight: 90.5 kg 91 kg 91 kg    Intake/Output Summary (Last 24 hours) at 01/11/2023 0652 Last data filed at 01/11/2023 0524 Gross per 24 hour  Intake 720 ml  Output 200 ml  Net 520 ml    Additional Objective Labs: Basic Metabolic Panel: Recent Labs  Lab 01/09/23 0217 01/10/23 0314 01/11/23 0227  NA 135 136 134*  K 4.6 4.5 4.0  CL 98 100 98  CO2 '28 28 27  '$ GLUCOSE 77 84 89  BUN 20 33* 20  CREATININE 2.38* 3.48* 2.28*  CALCIUM 9.6 9.9 9.3  PHOS 4.0 5.3* 3.7   Liver Function Tests: Recent Labs  Lab 01/09/23 0217 01/10/23 0314 01/11/23 0227  ALBUMIN 2.0* 1.9* 2.0*   CBC: Recent Labs  Lab 01/05/23 0158 01/06/23 0223 01/08/23 0245 01/10/23 1459  WBC 8.8 9.3 9.2 9.4  NEUTROABS 4.6 5.4  --   --   HGB 8.3* 8.9* 8.5* 8.3*  HCT 26.3* 28.1* 26.8* 26.5*  MCV 100.4* 101.8* 99.3 101.1*  PLT 372 362 353 380   Blood Culture    Component Value Date/Time   SDES WOUND 12/25/2022 1715   SPECREQUEST NONE 12/25/2022 1715    CULT  12/25/2022 1715    FEW ESCHERICHIA COLI ABUNDANT PROTEUS MIRABILIS ABUNDANT ENTEROCOCCUS FAECALIS VANCOMYCIN RESISTANT ENTEROCOCCUS    REPTSTATUS 12/29/2022 FINAL 12/25/2022 1715   CBG: Recent Labs  Lab 01/09/23 1630 01/09/23 2130 01/10/23 0808 01/10/23 1205 01/10/23 2122  GLUCAP 106* 121* 80 121* 85   Medications:  sodium chloride Stopped (01/06/23 0425)    (feeding supplement) PROSource Plus  30 mL Oral BID BM   sodium chloride   Intravenous Once   allopurinol  100 mg Oral Daily   aspirin EC  81 mg Oral Daily   atorvastatin  40 mg Oral Daily   Chlorhexidine Gluconate Cloth  6 each Topical Q0600   cycloSPORINE  1 drop Both Eyes BID   darbepoetin (ARANESP) injection - DIALYSIS  200 mcg Subcutaneous Q Fri-1800   feeding supplement  237 mL Oral TID WC   gabapentin  100 mg Oral TID   Gerhardt's butt cream   Topical BID   heparin  5,000 Units Subcutaneous Q8H   heparin sodium (porcine)  3,000 Units Intravenous Once   isosorbide dinitrate  5 mg Oral BID   melatonin  4.5 mg Oral QHS   multivitamin  1 tablet Oral QHS   polyethylene glycol  17 g Oral Daily   senna-docusate  2 tablet Oral BID   sodium chloride flush  3 mL Intravenous Q12H    Dialysis Orders: [facility] MWF 4h  400/800  84.5kg  2/2.5 bath   Heparin 4000 units  + 3000 mid treatment  [facility] - last OP HD 12/20, post wt 85.9kg - last IP hD 12/24, post wt 85kg - mircera 225 mcg IV q2, last 12/18, due 1/02 - no vdra   Assessment/Plan: ESRD - on HD MWF . He reports history of vertigo and how it worsens on HD. HD yesterday tolerated well in chair but did use narcotics during treatment.  Discussed these will not be available at outpatient dialysis.  Would benefit from having designated cushion to use during dialysis to help with off loading pressure on wound.  If unable to tolerate will not be appropriate for outpatient dialysis.  Chronic cholecystitis - w/ nausea, ( now resolved )  malnutrition, FTT.  S/p  laparoscopic cholecystectomy 12/19/2022. No further intervention planned Sacral decubitus ulcer - Per primary. Completed hydrotherapy/course of antibiotics. No surgical intervention currently needed. WOC re-revaluating and provided recommendations.   Chronic HFrEF - LVEF up to 50-55% here, diffuse CAD.  HTN/ volume - BP remain soft/stable. Not on any anti-hypertensives. No volume excess. UF as tolerated. Keep SBP >100. Bed weights up, doubt accuracy.  Anemia esrd -Last Hgb 8.3 S/p 1 unit PRBC, Aranesp 200 q Friday. Tsat low, ferritin high.  Hold IV iron for now.  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 No binder or VDRA currently. On regular diet because of FTT/nutrition DM2 - per pmd   Nutrition -Albumin low 2.0 PCM, FTT. On regular diet + protein supp per RD. Appetite improved and plans for PEG cancelled.  [place]- Palliative Care was consulted --Full code/full scope of care.  Dispo - Planned for SNF but now unsure if appropriate for outpatient HD.  No stretcher dialysis in this area.  May need LTAC.    Jen Mow, PA-C [facility] 01/11/2023,6:52 AM  LOS: 29 days

## 2023-01-11 NOTE — Progress Notes (Signed)
[facility] PROGRESS NOTE   JERVON REAM CWU:[id number] DOB: [birth year] DOA: 12/12/2022  PCP: Redmond School, MD  Brief History/Interval Summary: 77 year old male with past medical history of ESRD on hemodialysis Monday Wednesday Friday, HFrEF, diabetes mellitus type 2, hypertension who presented with nausea. Patient has known history of cholecystitis and underwent cholecystostomy tube placement. His tube had become dislodged and he underwent evaluation with surgery. He was noted to have recanalization of the bile duct therefore tube was not replaced. He came to hospital for elective cholecystectomy underwent preop evaluation with cardiology. His workup was notable for diffuse T wave inversions and prolonged QTc. Also was found to be significantly hypokalemic. He was started on electrolyte replacement. Nephrology was consulted for chronic hemodialysis during hospitalization. He was transferred to [facility] for cath and noted to have multivessel diffuse moderate CAD without obvious flow limiting lesion.  He was cleared for surgery and is now post cholecystecomy on 12/28.  Hospitalization c/b presence of sacral decubitus ulcer.  S/p hydrotherapy.  Currently on antibiotics.  Issue now is poor PO intake.  Had NG tube in place briefly.  Watching PO intake at this time to make decision regarding pursuing possible PEG.       Consultants: Nephrology.  Cardiology.  Palliative care    Subjective/Interval History: Patient tolerated sitting for his dialysis session yesterday although he did require pain medication.  No complaints offered this morning.     Assessment/Plan:  Chronic cholecystitis Now status post cholecystectomy.  Stable. Per general surgery; pneumoperitoneum is likely from lap chole.  Chronic systolic and diastolic CHF Echocardiogram showed EF to be 50 to 55%.  Previous EF was 35 to 40% in August 2023. Underwent cardiac catheterization which showed multivessel diffuse moderate  CAD without any acute or obvious flow-limiting lesions. Medical management being continued. Noted to be on aspirin, statin, nitrates. To be stable from cardiac standpoint.  End-stage renal disease Nephrology is following.  Dialyzed on Monday Wednesday Friday schedule.  Constipation Continue bowel regimen.  Anemia of chronic kidney disease No evidence for overt bleeding.  Hemoglobin stable.  Stage IV sacral decubitus -wound culture growing e. Coli resistant to ampicillin/unasyn, e faecalis resistant to vancomycin, pansensitive proteus  -completed course of hydrotherapy -CT with subcutaneous fat straining with complex collection posterior to the sacrum on the left measuring 2.5x1.2cm concerning for abscess, no evidence of osteo - vancomycin 1/3 - 1/5 - cefepime 1/3 - 1/9. E. Coli and proteus treated x 7 days with cefepime.   - flagyl 1/3-1/9  - ampicillin 1/8 - 1/15. E. Faecalis treated with ampicillin complete 1/15.   -sacral MRI could not be done due to the presence of the nerve stimulator -per surgery, no abscess but packing in the place of ulcer. -1/9 woc recommending stop hydrotherapy/dakins -> medihoney to inner wound and outer black wound q day, fill with gauze, cover with foam, change foam q3 days or prn soiling -Continue oxycodone 5 mg every 4 hours as needed -Continue local wound care as per wound care RN recommendation   Diabetes mellitus type 2 A1c 4.4 (likely reflecting his recent failure to thrive, poor PO intake).   Functional Quadriplegia  Essentially bedbound for the past year (needs help toileting, turning in bed, dressing, etc).  Hypophosphatemia/Hypomagnesemia -Repleted   Moderate Protein calorie malnutrition  Poor PO intake -BMI 22.98 kg/m -had cortrak to supplement nutrition in setting of his decubitus ulcer, but this was d/c'd after about 1 week  Oral intake has significantly improved.  No need  for PEG tube at this time.  DVT Prophylaxis: Subcutaneous  heparin Code Status: Full code Family Communication: Discussed with patient Disposition Plan: He needs to sit for his dialysis session before he can go to SNF as he will otherwise not be accepted into any of the outpatient HD clinics.  Patient was able to sit on chair for his dialysis yesterday.  However he did require oxycodone for pain.  Will need to see how he does on Monday.  Will have PT reevaluate in case he can go to SNF in the next few days.  Status is: Inpatient Remains inpatient appropriate because: Poor oral intake   Medications: Scheduled:  (feeding supplement) PROSource Plus  30 mL Oral BID BM   sodium chloride   Intravenous Once   allopurinol  100 mg Oral Daily   aspirin EC  81 mg Oral Daily   atorvastatin  40 mg Oral Daily   Chlorhexidine Gluconate Cloth  6 each Topical Q0600   cycloSPORINE  1 drop Both Eyes BID   darbepoetin (ARANESP) injection - DIALYSIS  200 mcg Subcutaneous Q Fri-1800   feeding supplement  237 mL Oral TID WC   gabapentin  100 mg Oral TID   Gerhardt's butt cream   Topical BID   heparin  5,000 Units Subcutaneous Q8H   heparin sodium (porcine)  3,000 Units Intravenous Once   isosorbide dinitrate  5 mg Oral BID   melatonin  4.5 mg Oral QHS   multivitamin  1 tablet Oral QHS   polyethylene glycol  17 g Oral Daily   senna-docusate  2 tablet Oral BID   sodium chloride flush  3 mL Intravenous Q12H   Continuous:  sodium chloride Stopped (01/06/23 0425)   QMV:HQIONG chloride, acetaminophen, bisacodyl, fentaNYL (SUBLIMAZE) injection, heparin, LORazepam, nitroGLYCERIN, oxyCODONE, polyethylene glycol, polyvinyl alcohol, prochlorperazine, simethicone, sorbitol, traMADol  Antibiotics: Anti-infectives (From admission, onward)    Start     Dose/Rate Route Frequency Ordered Stop   12/30/22 2200  ampicillin (OMNIPEN) 2 g in sodium chloride 0.9 % 100 mL IVPB        2 g 300 mL/hr over 20 Minutes Intravenous Every 12 hours 12/30/22 1519 01/06/23 0445   12/30/22  1145  linezolid (ZYVOX) IVPB 600 mg  Status:  Discontinued        600 mg 300 mL/hr over 60 Minutes Intravenous Every 12 hours 12/30/22 1056 12/30/22 1519   12/28/22 0000  vancomycin (VANCOCIN) IVPB 1000 mg/200 mL premix  Status:  Discontinued        1,000 mg 200 mL/hr over 60 Minutes Intravenous Every M-W-F (Hemodialysis) 12/27/22 1354 12/30/22 1056   12/27/22 1445  vancomycin (VANCOCIN) IVPB 1000 mg/200 mL premix        1,000 mg 200 mL/hr over 60 Minutes Intravenous  Once 12/27/22 1354 12/27/22 1738   12/27/22 1200  vancomycin (VANCOCIN) IVPB 1000 mg/200 mL premix  Status:  Discontinued        1,000 mg 200 mL/hr over 60 Minutes Intravenous Every M-W-F (Hemodialysis) 12/25/22 1831 12/27/22 1354   12/26/22 2100  ceFEPIme (MAXIPIME) 1 g in sodium chloride 0.9 % 100 mL IVPB        1 g 200 mL/hr over 30 Minutes Intravenous Every 24 hours 12/26/22 1052 12/31/22 2034   12/25/22 1900  ceFEPIme (MAXIPIME) 1 g in sodium chloride 0.9 % 100 mL IVPB  Status:  Discontinued        1 g 200 mL/hr over 30 Minutes Intravenous Every 24 hours 12/25/22 1812  12/26/22 1052   12/25/22 1900  vancomycin (VANCOREADY) IVPB 1750 mg/350 mL        1,750 mg 175 mL/hr over 120 Minutes Intravenous  Once 12/25/22 1812 12/26/22 0146   12/25/22 1900  metroNIDAZOLE (FLAGYL) IVPB 500 mg        500 mg 100 mL/hr over 60 Minutes Intravenous Every 12 hours 12/25/22 1812 01/01/23 0700   12/13/22 0600  cefoTEtan (CEFOTAN) 2 g in sodium chloride 0.9 % 100 mL IVPB  Status:  Discontinued        2 g 200 mL/hr over 30 Minutes Intravenous On call to O.R. 12/12/22 1631 12/14/22 0559   12/12/22 0615  cefoTEtan (CEFOTAN) 2 g in sodium chloride 0.9 % 100 mL IVPB  Status:  Discontinued        2 g 200 mL/hr over 30 Minutes Intravenous On call to O.R. 12/12/22 0614 12/12/22 0913       Objective:  Vital Signs  Vitals:   01/10/23 1838 01/10/23 2025 01/11/23 0500 01/11/23 0517  BP:  116/89  (!) 127/59  Pulse:  85  85  Resp:  12  16   Temp: 98.7 F (37.1 C) 97.9 F (36.6 C)  98.5 F (36.9 C)  TempSrc: Oral Oral  Oral  SpO2:  95%  95%  Weight:   91 kg   Height:        Intake/Output Summary (Last 24 hours) at 01/11/2023 0950 Last data filed at 01/11/2023 0900 Gross per 24 hour  Intake 1320 ml  Output 200 ml  Net 1120 ml    Filed Weights   01/08/23 0500 01/08/23 0917 01/11/23 0500  Weight: 90.5 kg 91 kg 91 kg   General appearance: Awake alert.  In no distress Resp: Clear to auscultation bilaterally.  Normal effort Cardio: S1-S2 is normal regular.  No S3-S4.  No rubs murmurs or bruit GI: Abdomen is soft.  Nontender nondistended.  Bowel sounds are present normal.  No masses organomegaly    Lab Results:  Data Reviewed: I have personally reviewed following labs and reports of the imaging studies  CBC: Recent Labs  Lab 01/05/23 0158 01/06/23 0223 01/08/23 0245 01/10/23 1459  WBC 8.8 9.3 9.2 9.4  NEUTROABS 4.6 5.4  --   --   HGB 8.3* 8.9* 8.5* 8.3*  HCT 26.3* 28.1* 26.8* 26.5*  MCV 100.4* 101.8* 99.3 101.1*  PLT 372 362 353 380     Basic Metabolic Panel: Recent Labs  Lab 01/07/23 0227 01/08/23 0245 01/09/23 0217 01/10/23 0314 01/11/23 0227  NA 134* 134* 135 136 134*  K 4.2 4.6 4.6 4.5 4.0  CL 97* 98 98 100 98  CO2 '27 26 28 28 27  '$ GLUCOSE 92 81 77 84 89  BUN 28* 40* 20 33* 20  CREATININE 2.45* 3.53* 2.38* 3.48* 2.28*  CALCIUM 9.0 9.6 9.6 9.9 9.3  PHOS 3.7 5.1* 4.0 5.3* 3.7     GFR: Estimated Creatinine Clearance: 32.4 mL/min (A) (by C-G formula based on SCr of 2.28 mg/dL (H)).  Liver Function Tests: Recent Labs  Lab 01/07/23 0227 01/08/23 0245 01/09/23 0217 01/10/23 0314 01/11/23 0227  ALBUMIN 1.9* 1.9* 2.0* 1.9* 2.0*       CBG: Recent Labs  Lab 01/09/23 2130 01/10/23 0808 01/10/23 1205 01/10/23 2122 01/11/23 0803  GLUCAP 121* 80 121* 85 89      Radiology Studies: No results found.     LOS: 29 days   Shawneen Deetz [employer] on  [email or web address]  01/11/2023, 9:50 AM

## 2023-01-11 NOTE — Evaluation (Signed)
Physical Therapy Evaluation Patient Details Name: [name] MRN: [id number] DOB: 03/08/1946 Today's Date: 01/11/2023  History of Present Illness  77 yo male admitted to [facility] 12/21 for cholecystectomy which was canceled due to hypokalemia and prolonged Qtc. Transfer to [facility] 12/26 with cardiac cath performed same date. 12/28 lap chole. +sacral ulcer  PMHx: ESRD on HD MWF, HFrEF, DM, HTN, anxiety, HLD, Bil TKA, gout, GERD  Clinical Impression   Pt admitted secondary to problem above with deficits below. PTA patient was living at home with his spouse and was ambulatory with RW (per chart). Pt currently requires +2 mod-max assist for bed mobility and coming to sit at EOB. Patient reported sacral wound pain, but willing to work through this and try to mobilize.  Anticipate patient will benefit from PT to address problems listed below.Will continue to follow acutely to maximize functional mobility independence and safety.          Recommendations for follow up therapy are one component of a multi-disciplinary discharge planning process, led by the attending physician.  Recommendations may be updated based on patient status, additional functional criteria and insurance authorization.  Follow Up Recommendations Skilled nursing-short term rehab (<3 hours/day) Can patient physically be transported by private vehicle: No    Assistance Recommended at Discharge Frequent or constant Supervision/Assistance  Patient can return home with the following  Two people to help with walking and/or transfers;Two people to help with bathing/dressing/bathroom;Assistance with cooking/housework;Assistance with feeding;Assist for transportation;Direct supervision/assist for medications management;Direct supervision/assist for financial management    Equipment Recommendations Hospital bed;Wheelchair (measurements PT);Wheelchair cushion (measurements PT);Other (comment) (hoyer lift, air matress)  Recommendations for  Other Services       Functional Status Assessment Patient has had a recent decline in their functional status and demonstrates the ability to make significant improvements in function in a reasonable and predictable amount of time.     Precautions / Restrictions Precautions Precautions: Fall;Other (comment) Precaution Comments: sacral wound Restrictions Weight Bearing Restrictions: No      Mobility  Bed Mobility Overal bed mobility: Needs Assistance Bed Mobility: Rolling, Sidelying to Sit, Sit to Sidelying Rolling: +2 for physical assistance, Max assist Sidelying to sit: Max assist, +2 for physical assistance, HOB elevated     Sit to sidelying: Max assist, +2 for physical assistance General bed mobility comments: roll to his right with +2 max assist with pt using LUE to pull on rail; assisted with moving legs over EOB and to raise torso to sitting    Transfers                   General transfer comment: unable    Ambulation/Gait               General Gait Details: unable  Stairs            Wheelchair Mobility    Modified Rankin (Stroke Patients Only)       Balance Overall balance assessment: Needs assistance Sitting-balance support: Bilateral upper extremity supported, Feet supported Sitting balance-Leahy Scale: Poor Sitting balance - Comments: leaning to his right with min assist to remain upright while his back was scratched       Standing balance comment: unable                             Pertinent Vitals/Pain Pain Assessment Pain Assessment: Faces Faces Pain Scale: Hurts whole lot Pain Location: sacrum Pain Descriptors / Indicators:  Discomfort, Grimacing, Guarding Pain Intervention(s): Limited activity within patient's tolerance, Monitored during session, Repositioned    Home Living Family/patient expects to be discharged to:: Private residence Living Arrangements: Spouse/significant other Available Help at  Discharge: Family;Available PRN/intermittently Type of Home: House Home Access: Stairs to enter Entrance Stairs-Rails: Right;Left;Can reach both Entrance Stairs-Number of Steps: 3   Home Layout: One level Home Equipment: Conservation officer, nature (2 wheels);Cane - single point;Grab bars - tub/shower;BSC/3in1 Additional Comments: Pt reports niece can stay with him, but unsure of accuracy    Prior Function Prior Level of Function : Needs assist  Cognitive Assist : Mobility (cognitive)     Physical Assist : Mobility (physical) Mobility (physical): Bed mobility;Transfers;Gait;Stairs   Mobility Comments: Household ambulator using RW ADLs Comments: assisted by family     Hand Dominance   Dominant Hand: Right    Extremity/Trunk Assessment   Upper Extremity Assessment Upper Extremity Assessment: Defer to OT evaluation    Lower Extremity Assessment Lower Extremity Assessment: Generalized weakness    Cervical / Trunk Assessment Cervical / Trunk Assessment: Kyphotic  Communication   Communication: No difficulties  Cognition Arousal/Alertness: Awake/alert Behavior During Therapy: Flat affect Overall Cognitive Status: No family/caregiver present to determine baseline cognitive functioning                               Problem Solving: Slow processing, Difficulty sequencing, Decreased initiation, Requires verbal cues, Requires tactile cues General Comments: followed simple commands; very weak and required incr assist for all mobility, but he did try to assist        General Comments General comments (skin integrity, edema, etc.): Positioned in right sidelying to decr pressure on sacral wound    Exercises     Assessment/Plan    PT Assessment Patient needs continued PT services  PT Problem List Decreased strength;Decreased activity tolerance;Decreased balance;Decreased mobility;Decreased knowledge of use of DME;Decreased skin integrity       PT Treatment Interventions  DME instruction;Gait training;Stair training;Functional mobility training;Therapeutic activities;Therapeutic exercise;Balance training;Patient/family education    PT Goals (Current goals can be found in the Care Plan section)  Acute Rehab PT Goals Patient Stated Goal: not stated PT Goal Formulation: With patient Time For Goal Achievement: 01/25/23 Potential to Achieve Goals: Fair    Frequency Min 2X/week     Co-evaluation               AM-PAC PT "6 Clicks" Mobility  Outcome Measure Help needed turning from your back to your side while in a flat bed without using bedrails?: Total Help needed moving from lying on your back to sitting on the side of a flat bed without using bedrails?: Total Help needed moving to and from a bed to a chair (including a wheelchair)?: Total Help needed standing up from a chair using your arms (e.g., wheelchair or bedside chair)?: Total Help needed to walk in hospital room?: Total Help needed climbing 3-5 steps with a railing? : Total 6 Click Score: 6    End of Session   Activity Tolerance: No increased pain;Patient limited by fatigue Patient left: in bed;with call bell/phone within reach;with bed alarm set   PT Visit Diagnosis: Unsteadiness on feet (R26.81);Other abnormalities of gait and mobility (R26.89);Muscle weakness (generalized) (M62.81);Difficulty in walking, not elsewhere classified (R26.2)    Time: 9563-8756 PT Time Calculation (min) (ACUTE ONLY): 16 min   Charges:   PT Evaluation $PT Eval Low Complexity: 1 Low  [place]  Office [phone]   Rexanne Mano 01/11/2023, 1:56 PM

## 2023-01-12 DIAGNOSIS — L89150 Pressure ulcer of sacral region, unstageable: Secondary | ICD-10-CM | POA: Diagnosis not present

## 2023-01-12 LAB — RENAL FUNCTION PANEL
Albumin: 1.9 g/dL — ABNORMAL LOW (ref 3.5–5.0)
Anion gap: 11 (ref 5–15)
BUN: 36 mg/dL — ABNORMAL HIGH (ref 8–23)
CO2: 24 mmol/L (ref 22–32)
Calcium: 9.6 mg/dL (ref 8.9–10.3)
Chloride: 99 mmol/L (ref 98–111)
Creatinine, Ser: 3.4 mg/dL — ABNORMAL HIGH (ref 0.61–1.24)
GFR, Estimated: 18 mL/min — ABNORMAL LOW (ref >60)
Glucose, Bld: 126 mg/dL — ABNORMAL HIGH (ref 70–99)
Phosphorus: 4.4 mg/dL (ref 2.5–4.6)
Potassium: 4.5 mmol/L (ref 3.5–5.1)
Sodium: 134 mmol/L — ABNORMAL LOW (ref 135–145)

## 2023-01-12 LAB — GLUCOSE, CAPILLARY
Glucose-Capillary: 93 mg/dL (ref 70–99)
Glucose-Capillary: 126 mg/dL — ABNORMAL HIGH (ref 70–99)
Glucose-Capillary: 111 mg/dL — ABNORMAL HIGH (ref 70–99)
Glucose-Capillary: 116 mg/dL — ABNORMAL HIGH (ref 70–99)

## 2023-01-12 MED ORDER — CHLORHEXIDINE GLUCONATE CLOTH 2 % EX PADS: 6.0000 | MEDICATED_PAD | Freq: Every day | CUTANEOUS | Status: DC

## 2023-01-12 NOTE — Evaluation (Signed)
Occupational Therapy Evaluation Patient Details Name: [name] MRN: [id number] DOB: [birth year] Today's Date: 01/12/2023   History of Present Illness 77 yo male admitted to [facility] 12/21 for cholecystectomy which was canceled due to hypokalemia and prolonged Qtc. Transfer to [facility] 12/26 with cardiac cath performed same date. 12/28 lap chole. +sacral ulcer  PMHx: ESRD on HD MWF, HFrEF, DM, HTN, anxiety, HLD, Bil TKA, gout, GERD   Clinical Impression   Patient is currently requiring assistance with ADLs including Total assist with bed level Lower body ADLs, Maximum assist with Upper body ADLs, with exception of feeding and grooming at bed level for which pt required Min-Mod Assist, as well as  Total assist +2-3 people with bed mobility for rolling RT and LT and inability to tolerate mobilizing to EOB or to chair due to sacral wound pain.   Current level of function is below patient's typical baseline.  During this evaluation, patient was limited by extremity stiffness and pain with movement as well as resting pain of 9.5/10 to sacrum, generalized weakness, impaired activity tolerance, and cognitive deficits, all of which has the potential to impact patient's safety and independence during functional mobility, as well as performance for ADLs.   Patient currently requires more care than can safely be provided at home.  Pt demonstrates fair to guarded rehab potential, and should benefit from continued skilled occupational therapy services while in acute care to maximize safety, independence and quality of life at home.  Continued occupational therapy services in a SNF setting prior to return home is recommended.  ?       Recommendations for follow up therapy are one component of a multi-disciplinary discharge planning process, led by the attending physician.  Recommendations may be updated based on patient status, additional functional criteria and insurance authorization.   Follow Up  Recommendations  Skilled nursing-short term rehab (<3 hours/day)     Assistance Recommended at Discharge Frequent or constant Supervision/Assistance  Patient can return home with the following Two people to help with walking and/or transfers;Two people to help with bathing/dressing/bathroom    Functional Status Assessment  Patient has had a recent decline in their functional status and demonstrates the ability to make significant improvements in function in a reasonable and predictable amount of time.  Equipment Recommendations  Hospital bed;Other (comment) Development worker, community, [employer] fast streamer)    Recommendations for [employer]       Precautions / Restrictions Precautions Precautions: Fall;Other (comment) Precaution Comments: sacral wound, CONTACT Restrictions Weight Bearing Restrictions: No      Mobility Bed Mobility Overal bed mobility: Needs Assistance Bed Mobility: Rolling Rolling: +2 for physical assistance, Max assist Sidelying to sit:  (Pt refused)            Transfers                          Balance                                           ADL either performed or assessed with clinical judgement   ADL Overall ADL's : Needs assistance/impaired Eating/Feeding: Set up;Minimal assistance;Bed level Eating/Feeding Details (indicate cue type and reason): Pt assisted to better position for feeding in bed. Pt uses RT hand only with increased difficulty and spilling. Grooming: Minimal assistance;Bed level;Wash/dry face Grooming Details (indicate cue type and reason): assist  with washcloth into R hand with pt able to bring to face. Min Assist for thoroughness. Upper Body Bathing: Moderate assistance;Bed level   Lower Body Bathing: Total assistance;+2 for physical assistance;Bed level   Upper Body Dressing : Maximal assistance;Bed level   Lower Body Dressing: Total assistance;Bed level     Toilet Transfer Details (indicate cue type and  reason): unable. Pt reports that he cannot tolerate sitting EOB due to sacral pain today. Pt to try sitting for HD tomorrow per RN. Toileting- Clothing Manipulation and Hygiene: Total assistance;+2 for physical assistance;Bed level Toileting - Clothing Manipulation Details (indicate cue type and reason): On external catheter     Functional mobility during ADLs: Total assistance;+2 for physical assistance General ADL Comments: Bed level feeding, bathing, dressing and toileting. Pain to sacrum and BLEs with movements.     Vision Ability to See in Adequate Light: 0 Adequate Vision Assessment?: No apparent visual deficits     Perception     Praxis      Pertinent Vitals/Pain Pain Assessment Pain Assessment: 0-10 Pain Score: 9  Pain Location: sacrum Pain Descriptors / Indicators: Discomfort, Grimacing, Guarding Pain Intervention(s): Patient requesting pain meds-RN notified, RN gave pain meds during session, Limited activity within patient's tolerance, Repositioned     Hand Dominance     Extremity/Trunk Assessment Upper Extremity Assessment Upper Extremity Assessment: LUE deficits/detail;RUE deficits/detail RUE Deficits / Details: arthritic changes in B hands, able to hold bedrail, wash cloth and bring hand to face. Uses RT hand to feed self. Very limited shoulder ROM, with PROM to ~105* but very stiff. Pt unable to lift UE from bed on his own. RUE: Unable to fully assess due to pain;Shoulder pain with ROM RUE Coordination: decreased fine motor LUE Deficits / Details: AROM: WFL with Thumb remote amputation. Arthritic appearing with MPs appearing fixed at ~90* but pt able to extend submaximally with cues. LUE Coordination: decreased fine motor   Lower Extremity Assessment Lower Extremity Assessment: Generalized weakness (Very stiff/tight with knees locked together. Used 2 pillows in "L" shape to seperate kness and protect boney prominences.)       Communication     Cognition  Arousal/Alertness: Awake/alert Behavior During Therapy: Flat affect Overall Cognitive Status: No family/caregiver present to determine baseline cognitive functioning                     Current Attention Level: Selective Memory: Decreased short-term memory Following Commands: Follows one step commands consistently, Follows multi-step commands inconsistently Safety/Judgement: Decreased awareness of deficits, Decreased awareness of safety Awareness: Emergent Problem Solving: Slow processing, Decreased initiation, Requires verbal cues, Requires tactile cues General Comments: Pt very distracted by pain. Asked orienation questions with pt asking in response, "Where's my pain meds?"  Pt's RN was contacted via call bell and arrived to room with pain meds.     General Comments       Exercises     Shoulder Instructions      Home Living Family/patient expects to be discharged to:: Skilled nursing facility                                        Prior Functioning/Environment                          OT Problem List: Decreased strength;Decreased activity tolerance;Impaired balance (sitting and/or standing);Pain;Decreased range of motion;Decreased  cognition;Impaired UE functional use      OT Treatment/Interventions: Self-care/ADL training;Therapeutic exercise;DME and/or AE instruction;Therapeutic activities;Patient/family education;Neuromuscular education;Cognitive remediation/compensation;Manual therapy;Balance training    OT Goals(Current goals can be found in the care plan section) Acute Rehab OT Goals Patient Stated Goal: Pt only talking about pain relief. OT Goal Formulation: With patient Time For Goal Achievement: 01/26/23 Potential to Achieve Goals: Fair ADL Goals Pt Will Perform Eating: with modified independence;sitting;with adaptive utensils (Will tolerate at least 1 meal a day in recliner with pressure relief cushion.) Pt Will Perform Grooming:  sitting;with set-up (In least restrictive position, working up to EOB with pain tolerance for at least 8 min.) Pt Will Transfer to Toilet: stand pivot transfer;with mod assist;bedside commode Pt Will Perform Toileting - Clothing Manipulation and hygiene: sitting/lateral leans;sit to/from stand;with mod assist Pt/caregiver will Perform Home Exercise Program: Increased ROM;Increased strength;Both right and left upper extremity;With Supervision;With minimal assist  OT Frequency: Min 2X/week    Co-evaluation              AM-PAC OT "6 Clicks" Daily Activity     Outcome Measure Help from another person eating meals?: A Little Help from another person taking care of personal grooming?: A Little Help from another person toileting, which includes using toliet, bedpan, or urinal?: Total Help from another person bathing (including washing, rinsing, drying)?: Total Help from another person to put on and taking off regular upper body clothing?: A Lot Help from another person to put on and taking off regular lower body clothing?: Total 6 Click Score: 11   End of Session Nurse Communication: Other (comment) (Nursng assisted with bed mobilty)  Activity Tolerance: Patient limited by pain Patient left: in bed;with call bell/phone within reach;with nursing/sitter in room  OT Visit Diagnosis: Unsteadiness on feet (R26.81);Muscle weakness (generalized) (M62.81);Pain Pain - part of body:  (sacrum)                Time: 7322-0254 OT Time Calculation (min): 20 min Charges:  OT General Charges $OT Visit: 1 Visit OT Evaluation $OT Eval Low Complexity: 1 Low  Shelvy Perazzo, OT Acute Rehab Services Office: [phone] 01/12/2023  Julien Girt 01/12/2023, 12:35 PM

## 2023-01-12 NOTE — Plan of Care (Signed)

## 2023-01-12 NOTE — Progress Notes (Signed)
[facility] PROGRESS NOTE   [name] LDJ:[id number] DOB: [birth year] DOA: 12/12/2022  PCP: Redmond School, MD  Brief History/Interval Summary: 77 year old male with past medical history of ESRD on hemodialysis Monday Wednesday Friday, HFrEF, diabetes mellitus type 2, hypertension who presented with nausea. Patient has known history of cholecystitis and underwent cholecystostomy tube placement. His tube had become dislodged and he underwent evaluation with surgery. He was noted to have recanalization of the bile duct therefore tube was not replaced. He came to hospital for elective cholecystectomy underwent preop evaluation with cardiology. His workup was notable for diffuse T wave inversions and prolonged QTc. Also was found to be significantly hypokalemic. He was started on electrolyte replacement. Nephrology was consulted for chronic hemodialysis during hospitalization. He was transferred to [facility] for cath and noted to have multivessel diffuse moderate CAD without obvious flow limiting lesion.  He was cleared for surgery and is now post cholecystecomy on 12/28.  Hospitalization c/b presence of sacral decubitus ulcer.  S/p hydrotherapy.  Currently on antibiotics.  Issue now is poor PO intake.  Had NG tube in place briefly.  Watching PO intake at this time to make decision regarding pursuing possible PEG.       Consultants: Nephrology.  Cardiology.  Palliative care    Subjective/Interval History: Denies any chest pain or shortness of breath this morning.  Complains of pain in his lower back/buttocks.     Assessment/Plan:  Stage IV sacral decubitus -wound culture growing e. Coli resistant to ampicillin/unasyn, e faecalis resistant to vancomycin, pansensitive proteus  -completed course of hydrotherapy -CT with subcutaneous fat straining with complex collection posterior to the sacrum on the left measuring 2.5x1.2cm concerning for abscess, no evidence of osteo - vancomycin 1/3 -  1/5 - cefepime 1/3 - 1/9. E. Coli and proteus treated x 7 days with cefepime.   - flagyl 1/3-1/9  - ampicillin 1/8 - 1/15. E. Faecalis treated with ampicillin complete 1/15.   -sacral MRI could not be done due to the presence of the nerve stimulator -per surgery, no abscess but packing in the place of ulcer. -1/9 woc recommending stop hydrotherapy/dakins -> medihoney to inner wound and outer black wound q day, fill with gauze, cover with foam, change foam q3 days or prn soiling -Continue oxycodone 5 mg every 4 hours as needed -Continue local wound care as per wound care RN recommendation  Chronic systolic and diastolic CHF Echocardiogram showed EF to be 50 to 55%.  Previous EF was 35 to 40% in August 2023. Underwent cardiac catheterization which showed multivessel diffuse moderate CAD without any acute or obvious flow-limiting lesions. Medical management being continued. Noted to be on aspirin, statin, nitrates. Patient is stable from cardiac standpoint.  End-stage renal disease Nephrology is following.  Dialyzed on Monday Wednesday Friday schedule. Was able to sit for his dialysis session on Friday but did require pain medicines.  To be attempted again on Monday with a cushion to see if that would help.  He needs to be able to sit for the sessions before he can be accepted at an outpatient dialysis clinic.  Chronic cholecystitis Now status post cholecystectomy.  Stable. Per general surgery; pneumoperitoneum is likely from lap chole.  Constipation Continue bowel regimen.  Last bowel movement was on 1/19  Anemia of chronic kidney disease No evidence for overt bleeding.  Hemoglobin stable.  Diabetes mellitus type 2 A1c 4.4 (likely reflecting his recent failure to thrive, poor PO intake).   Functional Quadriplegia  Essentially bedbound  for the past year (needs help toileting, turning in bed, dressing, etc).  Hypophosphatemia/Hypomagnesemia -Repleted   Moderate Protein calorie  malnutrition  Poor PO intake -BMI 22.98 kg/m -had cortrak to supplement nutrition in setting of his decubitus ulcer, but this was d/c'd after about 1 week  Oral intake has significantly improved.  No need for PEG tube at this time.  DVT Prophylaxis: Subcutaneous heparin Code Status: Full code Family Communication: Discussed with patient Disposition Plan: He needs to sit for his dialysis session before he can go to SNF as he will otherwise not be accepted into any of the outpatient HD clinics.  Patient was able to sit on chair for his dialysis on Friday.  However he did require oxycodone for pain.  Will need to see how he does on Monday.  Seen again by physical therapy who continues to recommend skilled nursing facility for rehab.  Status is: Inpatient Remains inpatient appropriate because: Poor oral intake   Medications: Scheduled:  (feeding supplement) PROSource Plus  30 mL Oral BID BM   sodium chloride   Intravenous Once   allopurinol  100 mg Oral Daily   aspirin EC  81 mg Oral Daily   atorvastatin  40 mg Oral Daily   Chlorhexidine Gluconate Cloth  6 each Topical Q0600   Chlorhexidine Gluconate Cloth  6 each Topical Q0600   cycloSPORINE  1 drop Both Eyes BID   darbepoetin (ARANESP) injection - DIALYSIS  200 mcg Subcutaneous Q Fri-1800   feeding supplement  237 mL Oral TID WC   gabapentin  100 mg Oral TID   Gerhardt's butt cream   Topical BID   heparin  5,000 Units Subcutaneous Q8H   heparin sodium (porcine)  3,000 Units Intravenous Once   isosorbide dinitrate  5 mg Oral BID   melatonin  4.5 mg Oral QHS   multivitamin  1 tablet Oral QHS   polyethylene glycol  17 g Oral Daily   senna-docusate  2 tablet Oral BID   sodium chloride flush  3 mL Intravenous Q12H   Continuous:  sodium chloride Stopped (01/06/23 0425)   UJW:JXBJYN chloride, acetaminophen, bisacodyl, fentaNYL (SUBLIMAZE) injection, heparin, LORazepam, nitroGLYCERIN, oxyCODONE, polyethylene glycol, polyvinyl alcohol,  prochlorperazine, simethicone, sorbitol, traMADol  Antibiotics: Anti-infectives (From admission, onward)    Start     Dose/Rate Route Frequency Ordered Stop   12/30/22 2200  ampicillin (OMNIPEN) 2 g in sodium chloride 0.9 % 100 mL IVPB        2 g 300 mL/hr over 20 Minutes Intravenous Every 12 hours 12/30/22 1519 01/06/23 0445   12/30/22 1145  linezolid (ZYVOX) IVPB 600 mg  Status:  Discontinued        600 mg 300 mL/hr over 60 Minutes Intravenous Every 12 hours 12/30/22 1056 12/30/22 1519   12/28/22 0000  vancomycin (VANCOCIN) IVPB 1000 mg/200 mL premix  Status:  Discontinued        1,000 mg 200 mL/hr over 60 Minutes Intravenous Every M-W-F (Hemodialysis) 12/27/22 1354 12/30/22 1056   12/27/22 1445  vancomycin (VANCOCIN) IVPB 1000 mg/200 mL premix        1,000 mg 200 mL/hr over 60 Minutes Intravenous  Once 12/27/22 1354 12/27/22 1738   12/27/22 1200  vancomycin (VANCOCIN) IVPB 1000 mg/200 mL premix  Status:  Discontinued        1,000 mg 200 mL/hr over 60 Minutes Intravenous Every M-W-F (Hemodialysis) 12/25/22 1831 12/27/22 1354   12/26/22 2100  ceFEPIme (MAXIPIME) 1 g in sodium chloride 0.9 % 100  mL IVPB        1 g 200 mL/hr over 30 Minutes Intravenous Every 24 hours 12/26/22 1052 12/31/22 2034   12/25/22 1900  ceFEPIme (MAXIPIME) 1 g in sodium chloride 0.9 % 100 mL IVPB  Status:  Discontinued        1 g 200 mL/hr over 30 Minutes Intravenous Every 24 hours 12/25/22 1812 12/26/22 1052   12/25/22 1900  vancomycin (VANCOREADY) IVPB 1750 mg/350 mL        1,750 mg 175 mL/hr over 120 Minutes Intravenous  Once 12/25/22 1812 12/26/22 0146   12/25/22 1900  metroNIDAZOLE (FLAGYL) IVPB 500 mg        500 mg 100 mL/hr over 60 Minutes Intravenous Every 12 hours 12/25/22 1812 01/01/23 0700   12/13/22 0600  cefoTEtan (CEFOTAN) 2 g in sodium chloride 0.9 % 100 mL IVPB  Status:  Discontinued        2 g 200 mL/hr over 30 Minutes Intravenous On call to O.R. 12/12/22 1631 12/14/22 0559   12/12/22 0615   cefoTEtan (CEFOTAN) 2 g in sodium chloride 0.9 % 100 mL IVPB  Status:  Discontinued        2 g 200 mL/hr over 30 Minutes Intravenous On call to O.R. 12/12/22 0614 12/12/22 0913       Objective:  Vital Signs  Vitals:   01/11/23 0517 01/11/23 1208 01/11/23 2035 01/12/23 0455  BP: (!) 127/59 (!) 101/59 (!) 106/56 115/70  Pulse: 85 82 84 84  Resp: '16 16 18 18  '$ Temp: 98.5 F (36.9 C) 99.2 F (37.3 C) 99.6 F (37.6 C) 99.5 F (37.5 C)  TempSrc: Oral Oral Oral Oral  SpO2: 95% 94% 97% 99%  Weight:    88 kg  Height:        Intake/Output Summary (Last 24 hours) at 01/12/2023 0925 Last data filed at 01/12/2023 0500 Gross per 24 hour  Intake 480 ml  Output 150 ml  Net 330 ml    Filed Weights   01/11/23 0500 01/12/23 0455  Weight: 91 kg 88 kg    General appearance: Awake alert.  In no distress Resp: Clear to auscultation bilaterally.  Normal effort Cardio: S1-S2 is normal regular.  No S3-S4.  No rubs murmurs or bruit GI: Abdomen is soft.  Nontender nondistended.  Bowel sounds are present normal.  No masses organomegaly    Lab Results:  Data Reviewed: I have personally reviewed following labs and reports of the imaging studies  CBC: Recent Labs  Lab 01/06/23 0223 01/08/23 0245 01/10/23 1459  WBC 9.3 9.2 9.4  NEUTROABS 5.4  --   --   HGB 8.9* 8.5* 8.3*  HCT 28.1* 26.8* 26.5*  MCV 101.8* 99.3 101.1*  PLT 362 353 380     Basic Metabolic Panel: Recent Labs  Lab 01/08/23 0245 01/09/23 0217 01/10/23 0314 01/11/23 0227 01/12/23 0152  NA 134* 135 136 134* 134*  K 4.6 4.6 4.5 4.0 4.5  CL 98 98 100 98 99  CO2 '26 28 28 27 24  '$ GLUCOSE 81 77 84 89 126*  BUN 40* 20 33* 20 36*  CREATININE 3.53* 2.38* 3.48* 2.28* 3.40*  CALCIUM 9.6 9.6 9.9 9.3 9.6  PHOS 5.1* 4.0 5.3* 3.7 4.4     GFR: Estimated Creatinine Clearance: 21.7 mL/min (A) (by C-G formula based on SCr of 3.4 mg/dL (H)).  Liver Function Tests: Recent Labs  Lab 01/08/23 0245 01/09/23 0217  01/10/23 0314 01/11/23 0227 01/12/23 0152  ALBUMIN 1.9* 2.0*  1.9* 2.0* 1.9*       CBG: Recent Labs  Lab 01/11/23 0803 01/11/23 1212 01/11/23 1712 01/11/23 2105 01/12/23 0731  GLUCAP 89 101* 112* 126* 93      Radiology Studies: No results found.     LOS: 30 days   Delores Thelen [employer] on [email or web address]  01/12/2023, 9:25 AM

## 2023-01-12 NOTE — Progress Notes (Signed)
[facility] Progress Note   Subjective:   Patient seen and examined at bedside.  About to eat breakfast.  Reports 9/10 pain from his bedsore.  Denies CP, SOB, abdominal pain and n/v/d.    Objective Vitals:   01/11/23 0517 01/11/23 1208 01/11/23 2035 01/12/23 0455  BP: (!) 127/59 (!) 101/59 (!) 106/56 115/70  Pulse: 85 82 84 84  Resp: '16 16 18 18  '$ Temp: 98.5 F (36.9 C) 99.2 F (37.3 C) 99.6 F (37.6 C) 99.5 F (37.5 C)  TempSrc: Oral Oral Oral Oral  SpO2: 95% 94% 97% 99%  Weight:    88 kg  Height:       Physical Exam General:thin, frail, elderly male in NAD Heart:RRR, no mrg Lungs:CTAB, nml WOB on RA Abdomen:soft, NTND Extremities:no LE edema Dialysis Access: [facility]   Filed Weights   01/11/23 0500 01/12/23 0455  Weight: 91 kg 88 kg    Intake/Output Summary (Last 24 hours) at 01/12/2023 0824 Last data filed at 01/12/2023 0500 Gross per 24 hour  Intake 840 ml  Output 150 ml  Net 690 ml    Additional Objective Labs: Basic Metabolic Panel: Recent Labs  Lab 01/10/23 0314 01/11/23 0227 01/12/23 0152  NA 136 134* 134*  K 4.5 4.0 4.5  CL 100 98 99  CO2 '28 27 24  '$ GLUCOSE 84 89 126*  BUN 33* 20 36*  CREATININE 3.48* 2.28* 3.40*  CALCIUM 9.9 9.3 9.6  PHOS 5.3* 3.7 4.4   Liver Function Tests: Recent Labs  Lab 01/10/23 0314 01/11/23 0227 01/12/23 0152  ALBUMIN 1.9* 2.0* 1.9*   CBC: Recent Labs  Lab 01/06/23 0223 01/08/23 0245 01/10/23 1459  WBC 9.3 9.2 9.4  NEUTROABS 5.4  --   --   HGB 8.9* 8.5* 8.3*  HCT 28.1* 26.8* 26.5*  MCV 101.8* 99.3 101.1*  PLT 362 353 380   CBG: Recent Labs  Lab 01/11/23 0803 01/11/23 1212 01/11/23 1712 01/11/23 2105 01/12/23 0731  GLUCAP 89 101* 112* 126* 93   Medications:  sodium chloride Stopped (01/06/23 0425)    (feeding supplement) PROSource Plus  30 mL Oral BID BM   sodium chloride   Intravenous Once   allopurinol  100 mg Oral Daily   aspirin EC  81 mg Oral Daily   atorvastatin  40 mg Oral  Daily   Chlorhexidine Gluconate Cloth  6 each Topical Q0600   cycloSPORINE  1 drop Both Eyes BID   darbepoetin (ARANESP) injection - DIALYSIS  200 mcg Subcutaneous Q Fri-1800   feeding supplement  237 mL Oral TID WC   gabapentin  100 mg Oral TID   Gerhardt's butt cream   Topical BID   heparin  5,000 Units Subcutaneous Q8H   heparin sodium (porcine)  3,000 Units Intravenous Once   isosorbide dinitrate  5 mg Oral BID   melatonin  4.5 mg Oral QHS   multivitamin  1 tablet Oral QHS   polyethylene glycol  17 g Oral Daily   senna-docusate  2 tablet Oral BID   sodium chloride flush  3 mL Intravenous Q12H    Dialysis Orders: [facility] MWF 4h  400/800  84.5kg  2/2.5 bath   Heparin 4000 units  + 3000 mid treatment  [facility] - last OP HD 12/20, post wt 85.9kg - last IP hD 12/24, post wt 85kg - mircera 225 mcg IV q2, last 12/18, due 1/02 - no vdra   Assessment/Plan: ESRD - on HD MWF . He reports history of  vertigo and how it worsens on HD. Plan for HD tomorrow in chair.  HD Saturday tolerated well in chair but did use narcotics during treatment.  Discussed these will not be available at outpatient dialysis.  Could try giving pain meds pre HD to see how he tolerates and simulate outpatient setting.  Would benefit from having designated cushion to use during dialysis to help with off loading pressure on wound.  If unable to tolerate will not be appropriate for outpatient dialysis.  Chronic cholecystitis - w/ nausea, ( now resolved )  malnutrition, FTT.  S/p laparoscopic cholecystectomy 12/19/2022. No further intervention planned Sacral decubitus ulcer - Per primary. Completed hydrotherapy/course of antibiotics. No surgical intervention currently needed. WOC re-revaluating and provided recommendations.   Chronic HFrEF - LVEF up to 50-55% here, diffuse CAD.  HTN/ volume - BP remain soft/stable. Not on any anti-hypertensives. No volume excess. UF as tolerated. Keep SBP >100. Bed weights up, doubt accuracy.   Anemia esrd -Last Hgb 8.3 S/p 1 unit PRBC, Aranesp 200 q Friday. Tsat low, ferritin high.  Hold IV iron for now.  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 No binder or VDRA currently. On regular diet because of FTT/nutrition DM2 - per pmd   Nutrition -Albumin low 2.0 PCM, FTT. On regular diet + protein supp per RD. Appetite improved and plans for PEG cancelled.  [place]- Palliative Care was consulted --Full code/full scope of care.  Dispo - Planned for SNF but now unsure if appropriate for outpatient HD.  No stretcher dialysis in this area.  May need LTAC.  Jen Mow, PA-C [facility] 01/12/2023,8:24 AM  LOS: 30 days

## 2023-01-13 DIAGNOSIS — L89150 Pressure ulcer of sacral region, unstageable: Secondary | ICD-10-CM | POA: Diagnosis not present

## 2023-01-13 LAB — GLUCOSE, CAPILLARY
Glucose-Capillary: 111 mg/dL — ABNORMAL HIGH (ref 70–99)
Glucose-Capillary: 111 mg/dL — ABNORMAL HIGH (ref 70–99)
Glucose-Capillary: 116 mg/dL — ABNORMAL HIGH (ref 70–99)
Glucose-Capillary: 146 mg/dL — ABNORMAL HIGH (ref 70–99)
Glucose-Capillary: 85 mg/dL (ref 70–99)

## 2023-01-13 LAB — RENAL FUNCTION PANEL
Albumin: 2 g/dL — ABNORMAL LOW (ref 3.5–5.0)
Anion gap: 10 (ref 5–15)
BUN: 54 mg/dL — ABNORMAL HIGH (ref 8–23)
CO2: 25 mmol/L (ref 22–32)
Calcium: 10.1 mg/dL (ref 8.9–10.3)
Chloride: 101 mmol/L (ref 98–111)
Creatinine, Ser: 4.6 mg/dL — ABNORMAL HIGH (ref 0.61–1.24)
GFR, Estimated: 12 mL/min — ABNORMAL LOW
Glucose, Bld: 145 mg/dL — ABNORMAL HIGH (ref 70–99)
Phosphorus: 4.9 mg/dL — ABNORMAL HIGH (ref 2.5–4.6)
Potassium: 4.7 mmol/L (ref 3.5–5.1)
Sodium: 136 mmol/L (ref 135–145)

## 2023-01-13 LAB — CBC
HCT: 27 % — ABNORMAL LOW (ref 39.0–52.0)
Hemoglobin: 8.2 g/dL — ABNORMAL LOW (ref 13.0–17.0)
MCH: 31.3 pg (ref 26.0–34.0)
MCHC: 30.4 g/dL (ref 30.0–36.0)
MCV: 103.1 fL — ABNORMAL HIGH (ref 80.0–100.0)
Platelets: 360 K/uL (ref 150–400)
RBC: 2.62 MIL/uL — ABNORMAL LOW (ref 4.22–5.81)
RDW: 18.7 % — ABNORMAL HIGH (ref 11.5–15.5)
WBC: 10.5 K/uL (ref 4.0–10.5)
nRBC: 0 % (ref 0.0–0.2)

## 2023-01-13 LAB — HEPATITIS B SURFACE ANTIGEN: Hepatitis B Surface Ag: NONREACTIVE

## 2023-01-13 MED ORDER — HEPARIN SODIUM (PORCINE) 1000 UNIT/ML IJ SOLN
INTRAMUSCULAR | Status: AC
  Administered 2023-01-13: 1000 [IU]
  Filled 2023-01-13: qty 4

## 2023-01-13 MED ORDER — HEPARIN SODIUM (PORCINE) 1000 UNIT/ML DIALYSIS: 1000.0000 [IU] | INTRAMUSCULAR | Status: DC | PRN

## 2023-01-13 MED ORDER — HEPARIN SODIUM (PORCINE) 1000 UNIT/ML DIALYSIS
4000.0000 [IU] | INTRAMUSCULAR | Status: DC | PRN
  Administered 2023-01-13: 3000 [IU] via INTRAVENOUS_CENTRAL

## 2023-01-13 MED ORDER — HEPARIN SODIUM (PORCINE) 1000 UNIT/ML IJ SOLN
INTRAMUSCULAR | Status: AC
  Administered 2023-01-13: 4000 [IU] via INTRAVENOUS_CENTRAL
  Filled 2023-01-13: qty 4

## 2023-01-13 MED ORDER — HEPARIN SODIUM (PORCINE) 1000 UNIT/ML DIALYSIS
4000.0000 [IU] | INTRAMUSCULAR | Status: DC | PRN
  Filled 2023-01-13: qty 4

## 2023-01-13 MED ORDER — POLYETHYLENE GLYCOL 3350 17 G PO PACK
17.0000 g | PACK | Freq: Two times a day (BID) | ORAL | Status: DC
  Administered 2023-01-13 – 2023-01-21 (×13): 17 g via ORAL
  Filled 2023-01-13 (×14): qty 1

## 2023-01-13 NOTE — Progress Notes (Signed)
[facility] Progress Note   Subjective:   Seen in room. No new complaints this am. Seems a little anxious, wants to eat. For dialysis today- to attempt in chair.   Objective Vitals:   01/12/23 2053 01/12/23 2124 01/13/23 0545 01/13/23 0758  BP: 133/66 117/65 131/70 138/61  Pulse: 91 87 80 75  Resp:      Temp: 98 F (36.7 C)  98.8 F (37.1 C) 99.3 F (37.4 C)  TempSrc: Oral  Oral Oral  SpO2: 96% 96% 95% 95%  Weight:   88 kg   Height:       Physical Exam General:thin, frail, elderly male in NAD Heart:RRR, no mrg Lungs:CTAB, nml WOB on RA Abdomen:soft, NTND Extremities:no LE edema Dialysis Access: [facility]   Filed Weights   01/11/23 0500 01/12/23 0455 01/13/23 0545  Weight: 91 kg 88 kg 88 kg    Intake/Output Summary (Last 24 hours) at 01/13/2023 1048 Last data filed at 01/13/2023 2202 Gross per 24 hour  Intake --  Output 850 ml  Net -850 ml     Additional Objective Labs: Basic Metabolic Panel: Recent Labs  Lab 01/10/23 0314 01/11/23 0227 01/12/23 0152  NA 136 134* 134*  K 4.5 4.0 4.5  CL 100 98 99  CO2 '28 27 24  '$ GLUCOSE 84 89 126*  BUN 33* 20 36*  CREATININE 3.48* 2.28* 3.40*  CALCIUM 9.9 9.3 9.6  PHOS 5.3* 3.7 4.4    Liver Function Tests: Recent Labs  Lab 01/10/23 0314 01/11/23 0227 01/12/23 0152  ALBUMIN 1.9* 2.0* 1.9*    CBC: Recent Labs  Lab 01/08/23 0245 01/10/23 1459  WBC 9.2 9.4  HGB 8.5* 8.3*  HCT 26.8* 26.5*  MCV 99.3 101.1*  PLT 353 380    CBG: Recent Labs  Lab 01/12/23 1607 01/12/23 2025 01/13/23 0012 01/13/23 0410 01/13/23 0803  GLUCAP 111* 116* 111* 85 111*    Medications:  sodium chloride Stopped (01/06/23 0425)    (feeding supplement) PROSource Plus  30 mL Oral BID BM   allopurinol  100 mg Oral Daily   aspirin EC  81 mg Oral Daily   atorvastatin  40 mg Oral Daily   Chlorhexidine Gluconate Cloth  6 each Topical Q0600   cycloSPORINE  1 drop Both Eyes BID   darbepoetin (ARANESP) injection -  DIALYSIS  200 mcg Subcutaneous Q Fri-1800   feeding supplement  237 mL Oral TID WC   gabapentin  100 mg Oral TID   Gerhardt's butt cream   Topical BID   heparin  5,000 Units Subcutaneous Q8H   isosorbide dinitrate  5 mg Oral BID   melatonin  4.5 mg Oral QHS   multivitamin  1 tablet Oral QHS   polyethylene glycol  17 g Oral BID   senna-docusate  2 tablet Oral BID   sodium chloride flush  3 mL Intravenous Q12H    Dialysis Orders: [facility] MWF 4h  400/800  84.5kg  2/2.5 bath   Heparin 4000 units  + 3000 mid treatment  [facility] - last OP HD 12/20, post wt 85.9kg - last IP hD 12/24, post wt 85kg - mircera 225 mcg IV q2, last 12/18, due 1/02 - no vdra   Assessment/Plan: ESRD - on HD MWF . He reports history of vertigo and how it worsens on HD. Plan for HD in chair chair.  HD Friday tolerated well in chair but did use narcotics during treatment.  Discussed these will not be available at outpatient dialysis.  Could try giving pain meds pre HD to see how he tolerates and simulate outpatient setting.  Would benefit from having designated cushion to use during dialysis to help with off loading pressure on wound.  If unable to tolerate will not be appropriate for outpatient dialysis.  Chronic cholecystitis - w/ nausea, ( now resolved )  malnutrition, FTT.  S/p laparoscopic cholecystectomy 12/19/2022. No further intervention planned Sacral decubitus ulcer - Per primary. Completed hydrotherapy/course of antibiotics. No surgical intervention currently needed. WOC re-revaluating and provided recommendations.   Chronic HFrEF - LVEF up to 50-55% here, diffuse CAD.  HTN/ volume - BP remain soft/stable. Not on any anti-hypertensives. No volume excess. UF as tolerated. Keep SBP >100. Bed weights up, doubt accuracy.  Anemia esrd -Last Hgb 8.3 S/p 1 unit PRBC, Aranesp 200 q Friday. Tsat low, ferritin high.  Hold IV iron for now.  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 No binder or VDRA currently. On regular diet  because of FTT/nutrition DM2 - per pmd   Nutrition -Albumin low 2.0 PCM, FTT. On regular diet + protein supp per RD. Appetite improved and plans for PEG cancelled.  Concepcion- Palliative Care was consulted --Full code/full scope of care.  Dispo - Planned for SNF but now unsure if appropriate for outpatient HD.   Lynnda Child PA-C [facility] 01/13/2023,10:48 AM

## 2023-01-13 NOTE — Plan of Care (Signed)
  Problem: Clinical Measurements: Goal: Ability to maintain clinical measurements within normal limits will improve Outcome: Progressing Goal: Diagnostic test results will improve Outcome: Progressing Goal: Respiratory complications will improve Outcome: Progressing   Problem: Health Behavior/Discharge Planning: Goal: Ability to manage health-related needs will improve Outcome: Not Progressing   Problem: Activity: Goal: Risk for activity intolerance will decrease Outcome: Not Progressing

## 2023-01-13 NOTE — Progress Notes (Signed)
Mobility Specialist - Progress Note   01/13/23 1615  Mobility  Activity Transferred from bed to chair  Level of Assistance +2 (takes two people)  Assistive Device MaxiMove  Activity Response Tolerated well  Mobility Referral Yes  $Mobility charge 1 Mobility   Pt received in bed needing assistance to chair. No complaints throughout transfer. Pt left in chair with all needs met and RN present.   Franki Monte  Mobility Specialist Please contact via Solicitor or Rehab office at [phone]

## 2023-01-13 NOTE — Progress Notes (Signed)
   01/13/23 2055  Vitals  Temp 98.9 F (37.2 C)  Temp Source Axillary  BP 109/65  MAP (mmHg) 76  BP Location Left Arm  BP Method Automatic  Patient Position (if appropriate) Sitting  Pulse Rate 90  Pulse Rate Source Monitor  ECG Heart Rate 84  Resp 17  Post Treatment  Dialyzer Clearance Lightly streaked  Duration of HD Treatment -hour(s) 4 hour(s)  Liters Processed 74.9  Fluid Removed (mL) 0.5 mL  Tolerated HD Treatment Yes (asymptomatic hypotension resolved with NS Bolus)  Post-Hemodialysis Comments Tx tolerated well, pt denies complaints

## 2023-01-13 NOTE — Progress Notes (Signed)
[facility] PROGRESS NOTE   [name] PPJ:[id number] DOB: 1946/05/27 DOA: 12/12/2022  PCP: Redmond School, MD  Brief History/Interval Summary: 77 year old male with past medical history of ESRD on hemodialysis Monday Wednesday Friday, HFrEF, diabetes mellitus type 2, hypertension who presented with nausea. Patient has known history of cholecystitis and underwent cholecystostomy tube placement. His tube had become dislodged and he underwent evaluation with surgery. He was noted to have recanalization of the bile duct therefore tube was not replaced. He came to hospital for elective cholecystectomy underwent preop evaluation with cardiology. His workup was notable for diffuse T wave inversions and prolonged QTc. Also was found to be significantly hypokalemic. He was started on electrolyte replacement. Nephrology was consulted for chronic hemodialysis during hospitalization. He was transferred to [facility] for cath and noted to have multivessel diffuse moderate CAD without obvious flow limiting lesion.  He was cleared for surgery and is now post cholecystecomy on 12/28.  Hospitalization c/b presence of sacral decubitus ulcer.  S/p hydrotherapy.  Currently on antibiotics.  Issue now is poor PO intake.  Had NG tube in place briefly.  Watching PO intake at this time to make decision regarding pursuing possible PEG.       Consultants: Nephrology.  Cardiology.  Palliative care    Subjective/Interval History: Slept well overnight.  Pain is reasonably well-controlled.  Looking forward to his dialysis session today.      Assessment/Plan:  Stage IV sacral decubitus -wound culture growing e. Coli resistant to ampicillin/unasyn, e faecalis resistant to vancomycin, pansensitive proteus  -completed course of hydrotherapy -CT with subcutaneous fat straining with complex collection posterior to the sacrum on the left measuring 2.5x1.2cm concerning for abscess, no evidence of osteo - vancomycin 1/3 -  1/5 - cefepime 1/3 - 1/9. E. Coli and proteus treated x 7 days with cefepime.   - flagyl 1/3-1/9  - ampicillin 1/8 - 1/15. E. Faecalis treated with ampicillin complete 1/15.   -sacral MRI could not be done due to the presence of the nerve stimulator -per surgery, no abscess but packing in the place of ulcer. -1/9 woc recommending stop hydrotherapy/dakins -> medihoney to inner wound and outer black wound q day, fill with gauze, cover with foam, change foam q3 days or prn soiling -Continue oxycodone 5 mg every 4 hours as needed -Continue local wound care as per wound care RN recommendation  Chronic systolic and diastolic CHF Echocardiogram showed EF to be 50 to 55%.  Previous EF was 35 to 40% in August 2023. Underwent cardiac catheterization which showed multivessel diffuse moderate CAD without any acute or obvious flow-limiting lesions. Medical management being continued. Noted to be on aspirin, statin, nitrates. Patient is stable from cardiac standpoint.  End-stage renal disease Nephrology is following.  Dialyzed on Monday Wednesday Friday schedule. Was able to sit for his dialysis session on Friday but did require pain medicines.  To be attempted again today.  Nephrology recommends cushion to see if that would help.   He needs to be able to sit for the sessions before he can be accepted at an outpatient dialysis clinic.  Chronic cholecystitis Now status post cholecystectomy.  Per general surgery; pneumoperitoneum is likely from lap chole. Stable.  Constipation Continue bowel regimen.  Last bowel movement was on 1/19.  Increase the dose of MiraLAX.  Anemia of chronic kidney disease No evidence for overt bleeding.  Hemoglobin stable.  Diabetes mellitus type 2 A1c 4.4 (likely reflecting his recent failure to thrive, poor PO intake).  Functional Quadriplegia  Essentially bedbound for the past year (needs help toileting, turning in bed, dressing,  etc).  Hypophosphatemia/Hypomagnesemia -Repleted   Moderate Protein calorie malnutrition  Poor PO intake -BMI 22.98 kg/m -had cortrak to supplement nutrition in setting of his decubitus ulcer, but this was d/c'd after about 1 week  Oral intake has significantly improved.  No need for PEG tube at this time.  DVT Prophylaxis: Subcutaneous heparin Code Status: Full code Family Communication: Discussed with patient Disposition Plan: He needs to sit for his dialysis session before he can go to SNF as he will otherwise not be accepted into any of the outpatient HD clinics.  Patient was able to sit on chair for his dialysis on Friday.  However he did require oxycodone for pain.  Will need to see how he does on Monday.  Seen again by physical therapy who continues to recommend skilled nursing facility for rehab.  Status is: Inpatient Remains inpatient appropriate because: Poor oral intake   Medications: Scheduled:  (feeding supplement) PROSource Plus  30 mL Oral BID BM   allopurinol  100 mg Oral Daily   aspirin EC  81 mg Oral Daily   atorvastatin  40 mg Oral Daily   Chlorhexidine Gluconate Cloth  6 each Topical Q0600   cycloSPORINE  1 drop Both Eyes BID   darbepoetin (ARANESP) injection - DIALYSIS  200 mcg Subcutaneous Q Fri-1800   feeding supplement  237 mL Oral TID WC   gabapentin  100 mg Oral TID   Gerhardt's butt cream   Topical BID   heparin  5,000 Units Subcutaneous Q8H   isosorbide dinitrate  5 mg Oral BID   melatonin  4.5 mg Oral QHS   multivitamin  1 tablet Oral QHS   polyethylene glycol  17 g Oral Daily   senna-docusate  2 tablet Oral BID   sodium chloride flush  3 mL Intravenous Q12H   Continuous:  sodium chloride Stopped (01/06/23 0425)   KGY:JEHUDJ chloride, acetaminophen, bisacodyl, fentaNYL (SUBLIMAZE) injection, heparin, LORazepam, nitroGLYCERIN, oxyCODONE, polyethylene glycol, polyvinyl alcohol, prochlorperazine, simethicone, sorbitol,  traMADol  Antibiotics: Anti-infectives (From admission, onward)    Start     Dose/Rate Route Frequency Ordered Stop   12/30/22 2200  ampicillin (OMNIPEN) 2 g in sodium chloride 0.9 % 100 mL IVPB        2 g 300 mL/hr over 20 Minutes Intravenous Every 12 hours 12/30/22 1519 01/06/23 0445   12/30/22 1145  linezolid (ZYVOX) IVPB 600 mg  Status:  Discontinued        600 mg 300 mL/hr over 60 Minutes Intravenous Every 12 hours 12/30/22 1056 12/30/22 1519   12/28/22 0000  vancomycin (VANCOCIN) IVPB 1000 mg/200 mL premix  Status:  Discontinued        1,000 mg 200 mL/hr over 60 Minutes Intravenous Every M-W-F (Hemodialysis) 12/27/22 1354 12/30/22 1056   12/27/22 1445  vancomycin (VANCOCIN) IVPB 1000 mg/200 mL premix        1,000 mg 200 mL/hr over 60 Minutes Intravenous  Once 12/27/22 1354 12/27/22 1738   12/27/22 1200  vancomycin (VANCOCIN) IVPB 1000 mg/200 mL premix  Status:  Discontinued        1,000 mg 200 mL/hr over 60 Minutes Intravenous Every M-W-F (Hemodialysis) 12/25/22 1831 12/27/22 1354   12/26/22 2100  ceFEPIme (MAXIPIME) 1 g in sodium chloride 0.9 % 100 mL IVPB        1 g 200 mL/hr over 30 Minutes Intravenous Every 24 hours 12/26/22 1052 12/31/22  2034   12/25/22 1900  ceFEPIme (MAXIPIME) 1 g in sodium chloride 0.9 % 100 mL IVPB  Status:  Discontinued        1 g 200 mL/hr over 30 Minutes Intravenous Every 24 hours 12/25/22 1812 12/26/22 1052   12/25/22 1900  vancomycin (VANCOREADY) IVPB 1750 mg/350 mL        1,750 mg 175 mL/hr over 120 Minutes Intravenous  Once 12/25/22 1812 12/26/22 0146   12/25/22 1900  metroNIDAZOLE (FLAGYL) IVPB 500 mg        500 mg 100 mL/hr over 60 Minutes Intravenous Every 12 hours 12/25/22 1812 01/01/23 0700   12/13/22 0600  cefoTEtan (CEFOTAN) 2 g in sodium chloride 0.9 % 100 mL IVPB  Status:  Discontinued        2 g 200 mL/hr over 30 Minutes Intravenous On call to O.R. 12/12/22 1631 12/14/22 0559   12/12/22 0615  cefoTEtan (CEFOTAN) 2 g in sodium  chloride 0.9 % 100 mL IVPB  Status:  Discontinued        2 g 200 mL/hr over 30 Minutes Intravenous On call to O.R. 12/12/22 0614 12/12/22 0913       Objective:  Vital Signs  Vitals:   01/12/23 2053 01/12/23 2124 01/13/23 0545 01/13/23 0758  BP: 133/66 117/65 131/70 138/61  Pulse: 91 87 80 75  Resp:      Temp: 98 F (36.7 C)  98.8 F (37.1 C) 99.3 F (37.4 C)  TempSrc: Oral  Oral Oral  SpO2: 96% 96% 95% 95%  Weight:   88 kg   Height:        Intake/Output Summary (Last 24 hours) at 01/13/2023 0907 Last data filed at 01/13/2023 2992 Gross per 24 hour  Intake --  Output 850 ml  Net -850 ml    Filed Weights   01/11/23 0500 01/12/23 0455 01/13/23 0545  Weight: 91 kg 88 kg 88 kg    General appearance: Awake alert.  In no distress Resp: Clear to auscultation bilaterally.  Normal effort Cardio: S1-S2 is normal regular.  No S3-S4.  No rubs murmurs or bruit GI: Abdomen is soft.  Nontender nondistended.  Bowel sounds are present normal.  No masses organomegaly    Lab Results:  Data Reviewed: I have personally reviewed following labs and reports of the imaging studies  CBC: Recent Labs  Lab 01/08/23 0245 01/10/23 1459  WBC 9.2 9.4  HGB 8.5* 8.3*  HCT 26.8* 26.5*  MCV 99.3 101.1*  PLT 353 380     Basic Metabolic Panel: Recent Labs  Lab 01/08/23 0245 01/09/23 0217 01/10/23 0314 01/11/23 0227 01/12/23 0152  NA 134* 135 136 134* 134*  K 4.6 4.6 4.5 4.0 4.5  CL 98 98 100 98 99  CO2 '26 28 28 27 24  '$ GLUCOSE 81 77 84 89 126*  BUN 40* 20 33* 20 36*  CREATININE 3.53* 2.38* 3.48* 2.28* 3.40*  CALCIUM 9.6 9.6 9.9 9.3 9.6  PHOS 5.1* 4.0 5.3* 3.7 4.4     GFR: Estimated Creatinine Clearance: 21.7 mL/min (A) (by C-G formula based on SCr of 3.4 mg/dL (H)).  Liver Function Tests: Recent Labs  Lab 01/08/23 0245 01/09/23 0217 01/10/23 0314 01/11/23 0227 01/12/23 0152  ALBUMIN 1.9* 2.0* 1.9* 2.0* 1.9*       CBG: Recent Labs  Lab 01/12/23 1607  01/12/23 2025 01/13/23 0012 01/13/23 0410 01/13/23 0803  GLUCAP 111* 116* 111* 85 111*      Radiology Studies: No results found.  LOS: 31 days   Mahreen Schewe [employer] on [email or web address]  01/13/2023, 9:07 AM

## 2023-01-13 NOTE — Progress Notes (Signed)
NS bolus given and UF off for low B/P pt denies complaints. B/P rechecked, monitoring

## 2023-01-14 DIAGNOSIS — L89159 Pressure ulcer of sacral region, unspecified stage: Secondary | ICD-10-CM | POA: Diagnosis not present

## 2023-01-14 DIAGNOSIS — R509 Fever, unspecified: Secondary | ICD-10-CM | POA: Diagnosis not present

## 2023-01-14 LAB — GLUCOSE, CAPILLARY
Glucose-Capillary: 139 mg/dL — ABNORMAL HIGH (ref 70–99)
Glucose-Capillary: 127 mg/dL — ABNORMAL HIGH (ref 70–99)
Glucose-Capillary: 106 mg/dL — ABNORMAL HIGH (ref 70–99)
Glucose-Capillary: 117 mg/dL — ABNORMAL HIGH (ref 70–99)

## 2023-01-14 MED ORDER — ACETAMINOPHEN 325 MG PO TABS
650.0000 mg | ORAL_TABLET | Freq: Three times a day (TID) | ORAL | Status: DC
  Administered 2023-01-14 – 2023-01-21 (×22): 650 mg via ORAL
  Filled 2023-01-14 (×21): qty 2

## 2023-01-14 NOTE — Progress Notes (Signed)
[facility] PROGRESS NOTE   [name] ERD:[id number] DOB: [birth year] DOA: 12/12/2022  PCP: Redmond School, MD  Brief History/Interval Summary: 77 year old male with past medical history of ESRD on hemodialysis Monday Wednesday Friday, HFrEF, diabetes mellitus type 2, hypertension who presented with nausea. Patient has known history of cholecystitis and underwent cholecystostomy tube placement. His tube had become dislodged and he underwent evaluation with surgery. He was noted to have recanalization of the bile duct therefore tube was not replaced. He came to hospital for elective cholecystectomy underwent preop evaluation with cardiology. His workup was notable for diffuse T wave inversions and prolonged QTc. Also was found to be significantly hypokalemic. He was started on electrolyte replacement. Nephrology was consulted for chronic hemodialysis during hospitalization. He was transferred to [facility] for cath and noted to have multivessel diffuse moderate CAD without obvious flow limiting lesion.  He was cleared for surgery and is now post cholecystecomy on 12/28.  Hospitalization c/b presence of sacral decubitus ulcer.  S/p hydrotherapy.  Currently on antibiotics.  Issue now is poor PO intake.  Had NG tube in place briefly.  Watching PO intake at this time to make decision regarding pursuing possible PEG.       Consultants: Nephrology.  Cardiology.  Palliative care    Subjective/Interval History: Patient slept well overnight.  He is complaining of pain in his buttock area from his chronic wounds.  Overnight episode of fever noted.  Patient mentions that he was able to sit up for dialysis yesterday.  He denies any new complaints.  Specifically denies any shortness of breath cough abdominal pain.   Assessment/Plan:  Stage IV sacral decubitus -wound culture growing e. Coli resistant to ampicillin/unasyn, e faecalis resistant to vancomycin, pansensitive proteus  -completed course of  hydrotherapy -CT with subcutaneous fat straining with complex collection posterior to the sacrum on the left measuring 2.5x1.2cm concerning for abscess, no evidence of osteo - vancomycin 1/3 - 1/5 - cefepime 1/3 - 1/9. E. Coli and proteus treated x 7 days with cefepime.   - flagyl 1/3-1/9  - ampicillin 1/8 - 1/15. E. Faecalis treated with ampicillin complete 1/15.   -sacral MRI could not be done due to the presence of the nerve stimulator -per surgery, no abscess but packing in the place of ulcer. -1/9 woc recommending stop hydrotherapy/dakins -> medihoney to inner wound and outer black wound q day, fill with gauze, cover with foam, change foam q3 days or prn soiling -Continue oxycodone 5 mg every 4 hours as needed -Continue local wound care as per wound care RN recommendation Noted to have fever overnight.  WBC was noted to be normal.  Reason is not entirely clear.  He has completed antibiotic courses as outlined above.  Recheck WBC tomorrow morning.  Monitor fever trends.  If he has fever again we will need to obtain blood cultures.  Chronic systolic and diastolic CHF Echocardiogram showed EF to be 50 to 55%.  Previous EF was 35 to 40% in August 2023. Underwent cardiac catheterization which showed multivessel diffuse moderate CAD without any acute or obvious flow-limiting lesions. Medical management being continued. Noted to be on aspirin, statin, nitrates. Patient is stable from cardiac standpoint.  End-stage renal disease Nephrology is following.  Dialyzed on Monday Wednesday Friday schedule. Was able to sit for his dialysis session on Friday but did require pain medicines.  To be attempted again today.  Nephrology recommends cushion to see if that would help.   He needs to be able  to sit for the sessions before he can be accepted at an outpatient dialysis clinic. Mentions that he was able to sit for dialysis yesterday.  Chronic cholecystitis Now status post cholecystectomy.  Per  general surgery; pneumoperitoneum is likely from lap chole. Stable.  Constipation Continue bowel regimen.  The last bowel movement that is charted is from 1/19.  Dose of MiraLAX was increased yesterday.  May need to use suppositories or enemas.    Anemia of chronic kidney disease No evidence for overt bleeding.  Hemoglobin stable.  Diabetes mellitus type 2 A1c 4.4 (likely reflecting his recent failure to thrive, poor PO intake).   Functional Quadriplegia  Essentially bedbound for the past year (needs help toileting, turning in bed, dressing, etc).  Hypophosphatemia/Hypomagnesemia -Repleted   Moderate Protein calorie malnutrition  Poor PO intake -BMI 22.98 kg/m -had cortrak to supplement nutrition in setting of his decubitus ulcer, but this was d/c'd after about 1 week  Oral intake has significantly improved.  No need for PEG tube at this time.  DVT Prophylaxis: Subcutaneous heparin Code Status: Full code Family Communication: Discussed with patient Disposition Plan: He needs to sit for his dialysis session before he can go to SNF as he will otherwise not be accepted into any of the outpatient HD clinics.  Patient was able to sit on chair for his dialysis on Friday.  However he did require oxycodone for pain.  Patient mentions that he was able to sit for dialysis yesterday.  Will see what nephrology says.  Unfortunately developed fever overnight.  Disposition not clear yet.  Status is: Inpatient Remains inpatient appropriate because: Poor oral intake   Medications: Scheduled:  (feeding supplement) PROSource Plus  30 mL Oral BID BM   allopurinol  100 mg Oral Daily   aspirin EC  81 mg Oral Daily   atorvastatin  40 mg Oral Daily   Chlorhexidine Gluconate Cloth  6 each Topical Q0600   cycloSPORINE  1 drop Both Eyes BID   darbepoetin (ARANESP) injection - DIALYSIS  200 mcg Subcutaneous Q Fri-1800   feeding supplement  237 mL Oral TID WC   gabapentin  100 mg Oral TID   Gerhardt's  butt cream   Topical BID   heparin  5,000 Units Subcutaneous Q8H   isosorbide dinitrate  5 mg Oral BID   melatonin  4.5 mg Oral QHS   multivitamin  1 tablet Oral QHS   polyethylene glycol  17 g Oral BID   senna-docusate  2 tablet Oral BID   sodium chloride flush  3 mL Intravenous Q12H   Continuous:  sodium chloride Stopped (01/06/23 0425)   GXQ:JJHERD chloride, acetaminophen, bisacodyl, fentaNYL (SUBLIMAZE) injection, heparin, LORazepam, nitroGLYCERIN, oxyCODONE, polyethylene glycol, polyvinyl alcohol, prochlorperazine, simethicone, sorbitol, traMADol  Antibiotics: Anti-infectives (From admission, onward)    Start     Dose/Rate Route Frequency Ordered Stop   12/30/22 2200  ampicillin (OMNIPEN) 2 g in sodium chloride 0.9 % 100 mL IVPB        2 g 300 mL/hr over 20 Minutes Intravenous Every 12 hours 12/30/22 1519 01/06/23 0445   12/30/22 1145  linezolid (ZYVOX) IVPB 600 mg  Status:  Discontinued        600 mg 300 mL/hr over 60 Minutes Intravenous Every 12 hours 12/30/22 1056 12/30/22 1519   12/28/22 0000  vancomycin (VANCOCIN) IVPB 1000 mg/200 mL premix  Status:  Discontinued        1,000 mg 200 mL/hr over 60 Minutes Intravenous Every M-W-F (Hemodialysis) 12/27/22  1354 12/30/22 1056   12/27/22 1445  vancomycin (VANCOCIN) IVPB 1000 mg/200 mL premix        1,000 mg 200 mL/hr over 60 Minutes Intravenous  Once 12/27/22 1354 12/27/22 1738   12/27/22 1200  vancomycin (VANCOCIN) IVPB 1000 mg/200 mL premix  Status:  Discontinued        1,000 mg 200 mL/hr over 60 Minutes Intravenous Every M-W-F (Hemodialysis) 12/25/22 1831 12/27/22 1354   12/26/22 2100  ceFEPIme (MAXIPIME) 1 g in sodium chloride 0.9 % 100 mL IVPB        1 g 200 mL/hr over 30 Minutes Intravenous Every 24 hours 12/26/22 1052 12/31/22 2034   12/25/22 1900  ceFEPIme (MAXIPIME) 1 g in sodium chloride 0.9 % 100 mL IVPB  Status:  Discontinued        1 g 200 mL/hr over 30 Minutes Intravenous Every 24 hours 12/25/22 1812 12/26/22  1052   12/25/22 1900  vancomycin (VANCOREADY) IVPB 1750 mg/350 mL        1,750 mg 175 mL/hr over 120 Minutes Intravenous  Once 12/25/22 1812 12/26/22 0146   12/25/22 1900  metroNIDAZOLE (FLAGYL) IVPB 500 mg        500 mg 100 mL/hr over 60 Minutes Intravenous Every 12 hours 12/25/22 1812 01/01/23 0700   12/13/22 0600  cefoTEtan (CEFOTAN) 2 g in sodium chloride 0.9 % 100 mL IVPB  Status:  Discontinued        2 g 200 mL/hr over 30 Minutes Intravenous On call to O.R. 12/12/22 1631 12/14/22 0559   12/12/22 0615  cefoTEtan (CEFOTAN) 2 g in sodium chloride 0.9 % 100 mL IVPB  Status:  Discontinued        2 g 200 mL/hr over 30 Minutes Intravenous On call to O.R. 12/12/22 0614 12/12/22 0913       Objective:  Vital Signs  Vitals:   01/13/23 2055 01/13/23 2142 01/14/23 0500 01/14/23 0845  BP: 109/65 113/69 116/61 124/84  Pulse: 90  97 88  Resp: '17 16 16   '$ Temp: 98.9 F (37.2 C) 99 F (37.2 C) 99.9 F (37.7 C) 99.3 F (37.4 C)  TempSrc: Axillary Oral Axillary Oral  SpO2: 97% 100% 96% 97%  Weight:      Height:        Intake/Output Summary (Last 24 hours) at 01/14/2023 0917 Last data filed at 01/14/2023 0500 Gross per 24 hour  Intake --  Output 200.5 ml  Net -200.5 ml    Filed Weights   01/11/23 0500 01/12/23 0455 01/13/23 0545  Weight: 91 kg 88 kg 88 kg    General appearance: Awake alert.  In no distress Resp: Clear to auscultation bilaterally.  Normal effort Cardio: S1-S2 is normal regular.  No S3-S4.  No rubs murmurs or bruit GI: Abdomen is soft.  Nontender nondistended.  Bowel sounds are present normal.  No masses organomegaly Extremities: No edema.  Physical deconditioning noted.    Lab Results:  Data Reviewed: I have personally reviewed following labs and reports of the imaging studies  CBC: Recent Labs  Lab 01/08/23 0245 01/10/23 1459 01/13/23 1623  WBC 9.2 9.4 10.5  HGB 8.5* 8.3* 8.2*  HCT 26.8* 26.5* 27.0*  MCV 99.3 101.1* 103.1*  PLT 353 380 360      Basic Metabolic Panel: Recent Labs  Lab 01/09/23 0217 01/10/23 0314 01/11/23 0227 01/12/23 0152 01/13/23 1624  NA 135 136 134* 134* 136  K 4.6 4.5 4.0 4.5 4.7  CL 98 100 98 99  101  CO2 '28 28 27 24 25  '$ GLUCOSE 77 84 89 126* 145*  BUN 20 33* 20 36* 54*  CREATININE 2.38* 3.48* 2.28* 3.40* 4.60*  CALCIUM 9.6 9.9 9.3 9.6 10.1  PHOS 4.0 5.3* 3.7 4.4 4.9*     GFR: Estimated Creatinine Clearance: 16.1 mL/min (A) (by C-G formula based on SCr of 4.6 mg/dL (H)).  Liver Function Tests: Recent Labs  Lab 01/09/23 0217 01/10/23 0314 01/11/23 0227 01/12/23 0152 01/13/23 1624  ALBUMIN 2.0* 1.9* 2.0* 1.9* 2.0*       CBG: Recent Labs  Lab 01/13/23 0410 01/13/23 0803 01/13/23 1315 01/13/23 2209 01/14/23 0810  GLUCAP 85 111* 146* 116* 139*      Radiology Studies: No results found.     LOS: 32 days   Ndidi Nesby [employer] on [email or web address]  01/14/2023, 9:17 AM

## 2023-01-14 NOTE — Progress Notes (Signed)
Pt's case discussed with nephrology staff. Pt was able to tolerate HD in the chair Friday and Monday. Plan is for pt to have HD in the chair tomorrow as well if possible. Inpt HD unit aware. Case discussed with CSW as well. Contacted [facility] to provide update regarding pt's tolerance of HD in the chair.   Melven Sartorius Renal Navigator [phone]

## 2023-01-14 NOTE — Care Management Important Message (Signed)
Important Message  Patient Details  Name: IZAK ANDING MRN: [id number] Date of Birth: 1946/06/29   Medicare Important Message Given:  Yes     Shelda Altes 01/14/2023, 8:10 AM

## 2023-01-14 NOTE — TOC Progression Note (Signed)
Transition of Care [facility]) - Progression Note    Patient Details  Name: [name] MRN: [id number] Date of Birth: 04-Sep-1946  Transition of Care [facility]) CM/SW [place], [place] Phone Number: 01/14/2023, 3:49 PM  Clinical Narrative:     CSW informed by tracy renal navigator and medical team patient has been tolerating sitting in chair for HD. CSW will fax patient out to [facility] again for possible SNF placement. CSW called Melissa with [facility] and LVM. CSW awaiting call back to see if she can offer SNF bed for patient since patient now tolerating sitting in chair for HD. CSW will continue to follow and assist with patients dc planning needs.  Expected Discharge Plan: [place] Barriers to Discharge: Continued Medical Work up  Expected Discharge Plan and Services In-house Referral: Clinical Social Work   Post Acute Care Choice: [place] Living arrangements for the past 2 months: [place] Determinants of Health (SDOH) Interventions SDOH Screenings   Food Insecurity: No Food Insecurity (01/05/2023)  Housing: Low Risk  (12/17/2022)  Transportation Needs: No Transportation Needs (12/17/2022)  Utilities: Not At Risk (12/17/2022)  Tobacco Use: Medium Risk (12/20/2022)    Readmission Risk Interventions    09/16/2022   12:31 PM 08/02/2022    2:23 PM  Readmission Risk Prevention Plan  Transportation Screening Complete Complete  Medication Review (RN Care Manager) Complete Complete  HRI or Home Care Consult Complete Complete  SW Recovery Care/Counseling Consult Complete Complete  Palliative Care Screening Not Applicable Not Applicable  Skilled Nursing Facility Not Applicable Complete

## 2023-01-14 NOTE — Progress Notes (Signed)
Physical Therapy Treatment Patient Details Name: [name] MRN: [id number] DOB: 06-25-1946 Today's Date: 01/14/2023   History of Present Illness 77 yo male admitted to [facility] 12/21 for cholecystectomy which was canceled due to hypokalemia and prolonged Qtc. Transfer to [facility] 12/26 with cardiac cath performed same date. 12/28 lap chole. +sacral ulcer  PMHx: ESRD on HD MWF, HFrEF, DM, HTN, anxiety, HLD, Bil TKA, gout, GERD    PT Comments    Pt continues to be limited in therapy tolerance by increase pain in sitting from sacral wound. Pt requiring maxAx2 for bed mobility. Able to sit EoB for 3 min before request to return to supine. Pt requiring total Ax2 for returning LE to bed D/c plans remain appropriate at this time. PT will continue to follow acutely.    Recommendations for follow up therapy are one component of a multi-disciplinary discharge planning process, led by the attending physician.  Recommendations may be updated based on patient status, additional functional criteria and insurance authorization.  Follow Up Recommendations  Skilled nursing-short term rehab (<3 hours/day) Can patient physically be transported by private vehicle: No   Assistance Recommended at Discharge Frequent or constant Supervision/Assistance  Patient can return home with the following Two people to help with walking and/or transfers;Two people to help with bathing/dressing/bathroom;Assistance with cooking/housework;Assistance with feeding;Assist for transportation;Direct supervision/assist for medications management;Direct supervision/assist for financial management   Equipment Recommendations  Hospital bed;Wheelchair (measurements PT);Wheelchair cushion (measurements PT);Other (comment) (hoyer lift, air matress)       Precautions / Restrictions Precautions Precautions: Fall;Other (comment) Precaution Comments: sacral wound Restrictions Weight Bearing Restrictions: No     Mobility  Bed  Mobility Overal bed mobility: Needs Assistance Bed Mobility: Rolling, Sidelying to Sit, Sit to Sidelying Rolling: +2 for physical assistance, Max assist Sidelying to sit: Max assist, +2 for physical assistance, HOB elevated     Sit to sidelying: +2 for physical assistance, Total assist General bed mobility comments: Able to roll onto R side with maxAx2, and come to sitting with maxAx2, only able to tolerate 3 min of sitting with increased R lateral lean to off weight his sacrum, does not want to attempt to stand and request to return to bed, pt requires total A for returning LE to bed    Transfers                   General transfer comment: unable    Ambulation/Gait               General Gait Details: unable         Balance Overall balance assessment: Needs assistance Sitting-balance support: Bilateral upper extremity supported, Feet supported Sitting balance-Leahy Scale: Poor Sitting balance - Comments: leaning to his right with min assist to remain upright       Standing balance comment: unable                            Cognition Arousal/Alertness: Awake/alert Behavior During Therapy: Flat affect Overall Cognitive Status: No family/caregiver present to determine baseline cognitive functioning                               Problem Solving: Slow processing, Difficulty sequencing, Decreased initiation, Requires verbal cues, Requires tactile cues General Comments: pt found in bed trying to suck down protein supplement, however spilling all down his chin, PT had to assist with finishing  packet prior to therapy. Pt unaware of mess,        Exercises      General Comments General comments (skin integrity, edema, etc.): positioned in R sidelying to decreased sacral wound pressure      Pertinent Vitals/Pain Pain Assessment Pain Assessment: 0-10 Pain Score: 9  Pain Location: sacrum Pain Descriptors / Indicators: Discomfort,  Grimacing, Guarding Pain Intervention(s): Limited activity within patient's tolerance, Monitored during session, Repositioned, Premedicated before session     PT Goals (current goals can now be found in the care plan section) Acute Rehab PT Goals Patient Stated Goal: not stated PT Goal Formulation: With patient Time For Goal Achievement: 01/25/23 Potential to Achieve Goals: Fair Progress towards PT goals: Not progressing toward goals - comment (limited by sacral wound and profound weakness)    Frequency    Min 2X/week      PT Plan Current plan remains appropriate    Co-evaluation              AM-PAC PT "6 Clicks" Mobility   Outcome Measure  Help needed turning from your back to your side while in a flat bed without using bedrails?: Total Help needed moving from lying on your back to sitting on the side of a flat bed without using bedrails?: Total Help needed moving to and from a bed to a chair (including a wheelchair)?: Total Help needed standing up from a chair using your arms (e.g., wheelchair or bedside chair)?: Total Help needed to walk in hospital room?: Total Help needed climbing 3-5 steps with a railing? : Total 6 Click Score: 6    End of Session   Activity Tolerance: Patient limited by pain Patient left: in bed;with call bell/phone within reach;with bed alarm set Nurse Communication: Mobility status;Other (comment) (need for new air bed as current bed is loosing air) PT Visit Diagnosis: Unsteadiness on feet (R26.81);Other abnormalities of gait and mobility (R26.89);Muscle weakness (generalized) (M62.81);Difficulty in walking, not elsewhere classified (R26.2)     Time: 6568-1275 PT Time Calculation (min) (ACUTE ONLY): 28 min  Charges:  $Therapeutic Activity: 8-22 mins $Self Care/Home Management: 8-22                     Miangel Flom B. Migdalia Dk PT, DPT Acute Rehabilitation Services Please use secure chat or  Call Office [phone]    [place] 01/14/2023, 3:59 PM

## 2023-01-14 NOTE — Progress Notes (Signed)
[facility] Progress Note   Subjective:   Seen in room. Completed dialysis yesterday, sitting up. Today bottom is sore. Asking for pain meds.   Objective Vitals:   01/13/23 2055 01/13/23 2142 01/14/23 0500 01/14/23 0845  BP: 109/65 113/69 116/61 124/84  Pulse: 90  97 88  Resp: '17 16 16   '$ Temp: 98.9 F (37.2 C) 99 F (37.2 C) 99.9 F (37.7 C) 99.3 F (37.4 C)  TempSrc: Axillary Oral Axillary Oral  SpO2: 97% 100% 96% 97%  Weight:      Height:       Physical Exam General: thin, frail, elderly male in NAD Heart: RRR, no mrg Lungs: CTAB, nml WOB on RA Abdomen: soft, NTND Extremities: no LE edema Dialysis Access: [facility]   Filed Weights   01/11/23 0500 01/12/23 0455 01/13/23 0545  Weight: 91 kg 88 kg 88 kg    Intake/Output Summary (Last 24 hours) at 01/14/2023 1157 Last data filed at 01/14/2023 0500 Gross per 24 hour  Intake --  Output 200.5 ml  Net -200.5 ml     Additional Objective Labs: Basic Metabolic Panel: Recent Labs  Lab 01/11/23 0227 01/12/23 0152 01/13/23 1624  NA 134* 134* 136  K 4.0 4.5 4.7  CL 98 99 101  CO2 '27 24 25  '$ GLUCOSE 89 126* 145*  BUN 20 36* 54*  CREATININE 2.28* 3.40* 4.60*  CALCIUM 9.3 9.6 10.1  PHOS 3.7 4.4 4.9*    Liver Function Tests: Recent Labs  Lab 01/11/23 0227 01/12/23 0152 01/13/23 1624  ALBUMIN 2.0* 1.9* 2.0*    CBC: Recent Labs  Lab 01/08/23 0245 01/10/23 1459 01/13/23 1623  WBC 9.2 9.4 10.5  HGB 8.5* 8.3* 8.2*  HCT 26.8* 26.5* 27.0*  MCV 99.3 101.1* 103.1*  PLT 353 380 360    CBG: Recent Labs  Lab 01/13/23 0410 01/13/23 0803 01/13/23 1315 01/13/23 2209 01/14/23 0810  GLUCAP 85 111* 146* 116* 139*    Medications:  sodium chloride Stopped (01/06/23 0425)    (feeding supplement) PROSource Plus  30 mL Oral BID BM   acetaminophen  650 mg Oral TID   allopurinol  100 mg Oral Daily   aspirin EC  81 mg Oral Daily   atorvastatin  40 mg Oral Daily   Chlorhexidine Gluconate Cloth  6  each Topical Q0600   cycloSPORINE  1 drop Both Eyes BID   darbepoetin (ARANESP) injection - DIALYSIS  200 mcg Subcutaneous Q Fri-1800   feeding supplement  237 mL Oral TID WC   gabapentin  100 mg Oral TID   Gerhardt's butt cream   Topical BID   heparin  5,000 Units Subcutaneous Q8H   isosorbide dinitrate  5 mg Oral BID   melatonin  4.5 mg Oral QHS   multivitamin  1 tablet Oral QHS   polyethylene glycol  17 g Oral BID   senna-docusate  2 tablet Oral BID   sodium chloride flush  3 mL Intravenous Q12H    Dialysis Orders: [facility] MWF 4h  400/800  84.5kg  2/2.5 bath   Heparin 4000 units  + 3000 mid treatment  [facility] - last OP HD 12/20, post wt 85.9kg - last IP hD 12/24, post wt 85kg - mircera 225 mcg IV q2, last 12/18, due 1/02 - no vdra   Assessment/Plan: ESRD - on HD MWF . He reports history of vertigo and how it worsens on HD. Plan for HD in chair as tolerated. Friday tolerated well in chair but did  use narcotics during treatment.  Discussed these will not be available at outpatient dialysis.  Could try giving pain meds pre HD to see how he tolerates and simulate outpatient setting.  Would benefit from having designated cushion to use during dialysis to help with off loading pressure on wound.  If unable to tolerate will not be appropriate for outpatient dialysis.  Chronic cholecystitis - w/ nausea, ( now resolved )  malnutrition, FTT.  S/p laparoscopic cholecystectomy. Sacral decubitus ulcer - Per primary. Completed hydrotherapy/course of antibiotics. Chronic HFrEF - LVEF up to 50-55% here, diffuse CAD.  HTN/ volume - BP remain soft/stable. Not on any anti-hypertensives. No volume excess. UF as tolerated. Keep SBP >100. Bed weights up, doubt accuracy.  Anemia esrd -Last Hgb 8.3 S/p 1 unit PRBC, Aranesp 200 q Friday. Tsat low, ferritin high.  Hold IV iron for now.  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 No binder or VDRA currently. On regular diet because of FTT/nutrition DM2 - per pmd    Nutrition -Albumin low 2.0 PCM, FTT. On regular diet + protein supp per RD. Appetite improved and plans for PEG cancelled.  [place]- Palliative Care was consulted --Full code/full scope of care.  Dispo - Planned for SNF but now unsure if appropriate for outpatient HD.   Lynnda Child PA-C [facility] 01/14/2023,11:57 AM

## 2023-01-15 DIAGNOSIS — R509 Fever, unspecified: Secondary | ICD-10-CM | POA: Diagnosis not present

## 2023-01-15 DIAGNOSIS — L89159 Pressure ulcer of sacral region, unspecified stage: Secondary | ICD-10-CM | POA: Diagnosis not present

## 2023-01-15 LAB — RENAL FUNCTION PANEL
Albumin: 2.1 g/dL — ABNORMAL LOW (ref 3.5–5.0)
Anion gap: 13 (ref 5–15)
BUN: 20 mg/dL (ref 8–23)
CO2: 28 mmol/L (ref 22–32)
Calcium: 9.5 mg/dL (ref 8.9–10.3)
Chloride: 92 mmol/L — ABNORMAL LOW (ref 98–111)
Creatinine, Ser: 2.25 mg/dL — ABNORMAL HIGH (ref 0.61–1.24)
GFR, Estimated: 29 mL/min — ABNORMAL LOW (ref >60)
Glucose, Bld: 125 mg/dL — ABNORMAL HIGH (ref 70–99)
Phosphorus: 2.9 mg/dL (ref 2.5–4.6)
Potassium: 3.9 mmol/L (ref 3.5–5.1)
Sodium: 133 mmol/L — ABNORMAL LOW (ref 135–145)

## 2023-01-15 LAB — CBC
HCT: 26.7 % — ABNORMAL LOW (ref 39.0–52.0)
Hemoglobin: 8.2 g/dL — ABNORMAL LOW (ref 13.0–17.0)
MCH: 31.7 pg (ref 26.0–34.0)
MCHC: 30.7 g/dL (ref 30.0–36.0)
MCV: 103.1 fL — ABNORMAL HIGH (ref 80.0–100.0)
Platelets: 325 10*3/uL (ref 150–400)
RBC: 2.59 MIL/uL — ABNORMAL LOW (ref 4.22–5.81)
RDW: 18.4 % — ABNORMAL HIGH (ref 11.5–15.5)
WBC: 13.5 10*3/uL — ABNORMAL HIGH (ref 4.0–10.5)
nRBC: 0 % (ref 0.0–0.2)

## 2023-01-15 LAB — GLUCOSE, CAPILLARY
Glucose-Capillary: 98 mg/dL (ref 70–99)
Glucose-Capillary: 132 mg/dL — ABNORMAL HIGH (ref 70–99)

## 2023-01-15 MED ORDER — HEPARIN SODIUM (PORCINE) 1000 UNIT/ML IJ SOLN: 4000.0000 [IU] | Freq: Once | INTRAMUSCULAR | Status: DC

## 2023-01-15 MED ORDER — ALTEPLASE 2 MG IJ SOLR: 2.0000 mg | Freq: Once | INTRAMUSCULAR | Status: DC | PRN

## 2023-01-15 MED ORDER — HEPARIN SODIUM (PORCINE) 1000 UNIT/ML DIALYSIS: 4000.0000 [IU] | Freq: Once | INTRAMUSCULAR | Status: DC

## 2023-01-15 MED ORDER — FLEET ENEMA 7-19 GM/118ML RE ENEM
1.0000 | ENEMA | Freq: Once | RECTAL | Status: DC
  Filled 2023-01-15: qty 1

## 2023-01-15 MED ORDER — AMOXICILLIN-POT CLAVULANATE 500-125 MG PO TABS
1.0000 | ORAL_TABLET | Freq: Every day | ORAL | Status: AC
  Administered 2023-01-15 – 2023-01-21 (×7): 1 via ORAL
  Filled 2023-01-15 (×7): qty 1

## 2023-01-15 MED ORDER — HEPARIN SODIUM (PORCINE) 1000 UNIT/ML IJ SOLN: 3000.0000 [IU] | Freq: Once | INTRAMUSCULAR | Status: AC

## 2023-01-15 MED ORDER — ANTICOAGULANT SODIUM CITRATE 4% (200MG/5ML) IV SOLN: 5.0000 mL | Status: DC | PRN

## 2023-01-15 MED ORDER — HEPARIN SODIUM (PORCINE) 1000 UNIT/ML IJ SOLN
INTRAMUSCULAR | Status: AC
  Administered 2023-01-15: 3000 [IU] via INTRAVENOUS
  Filled 2023-01-15: qty 4

## 2023-01-15 MED ORDER — HEPARIN SODIUM (PORCINE) 1000 UNIT/ML DIALYSIS
1000.0000 [IU] | INTRAMUSCULAR | Status: DC | PRN
  Filled 2023-01-15: qty 1

## 2023-01-15 MED ORDER — DIPHENHYDRAMINE HCL 50 MG/ML IJ SOLN: 50.0000 mg | Freq: Once | INTRAMUSCULAR | Status: DC

## 2023-01-15 MED ORDER — HEPARIN SODIUM (PORCINE) 1000 UNIT/ML IJ SOLN
INTRAMUSCULAR | Status: AC
  Filled 2023-01-15: qty 3

## 2023-01-15 NOTE — Progress Notes (Signed)
Mobility Specialist - Progress Note   01/15/23 1318  Mobility  Activity Transferred from chair to bed  Level of Assistance Total care  Assistive Device MaxiMove  Activity Response Tolerated well  Mobility Referral Yes  $Mobility charge 1 Mobility   Pt received in chair needing to be move back to bed using maximove. No complaints throughout. Pt left in bed with all needs met. RN present.  Franki Monte  Mobility Specialist Please contact via Solicitor or Rehab office at [phone]

## 2023-01-15 NOTE — Procedures (Signed)
HD Note:  Some information was entered later than the data was gathered due to patient care needs. The stated time with the data is accurate.  Received patient in bed to unit.  Alert and oriented Informed consent signed and in chart.   Patient SBP did not support UF pull intermittently  Goal was lowered to 500.    Transported back to the room  Alert, without acute distress.  Hand-off given to patient's nurse.   Access used: HD catheter upper right chest Access issues: lines needed to be reversed, then no problem.  Total UF removed: 532m    Dalaysia Harms L [facility]

## 2023-01-15 NOTE — Progress Notes (Signed)
Case discussed with CSW this afternoon. CSW advised that pt has been able to sit for HD the last 3 treatments. SNF placement in process.   Melven Sartorius Renal Navigator [phone]

## 2023-01-15 NOTE — TOC Progression Note (Addendum)
Transition of Care [facility]) - Progression Note    Patient Details  Name: SHEILA GERVASI MRN: [id number] Date of Birth: [birth year]  Transition of Care [facility]) CM/SW [place], [place] Phone Number: 01/15/2023, 1:44 PM  Clinical Narrative:     No current SNF bed offers for patient. CSW will continue to follow and assist with patients dc planning needs.  CSW Lvm with Melissa with [place]. CSW awaiting call back to see if [facility] and look at patients referral again. CSW resent referral to [place] at [facility] who is agreeable to look at patients referral again. Patient updated on status of placement and in agreement for CSW to try and send out to facility's again for possible SNF placement.CSW will continue to follow.   Expected Discharge Plan: [place] Barriers to Discharge: Continued Medical Work up  Expected Discharge Plan and Services In-house Referral: Clinical Social Work   Post Acute Care Choice: [place] Living arrangements for the past 2 months: [place] Determinants of Health (SDOH) Interventions SDOH Screenings   Food Insecurity: No Food Insecurity (01/05/2023)  Housing: Low Risk  (12/17/2022)  Transportation Needs: No Transportation Needs (12/17/2022)  Utilities: Not At Risk (12/17/2022)  Tobacco Use: Medium Risk (12/20/2022)    Readmission Risk Interventions    09/16/2022   12:31 PM 08/02/2022    2:23 PM  Readmission Risk Prevention Plan  Transportation Screening Complete Complete  Medication Review (RN Care Manager) Complete Complete  HRI or Home Care Consult Complete Complete  SW Recovery Care/Counseling Consult Complete Complete  Palliative Care Screening Not Applicable Not Applicable  Skilled Nursing Facility Not Applicable Complete

## 2023-01-15 NOTE — Progress Notes (Signed)
[facility] Progress Note   Subjective:  Seen in K[facility] -sitting in recliner for dialysis. Tolerating so far. Appears to be having cold shivers this am. Afebrile.    Objective Vitals:   01/15/23 0819 01/15/23 0825 01/15/23 0830 01/15/23 0900  BP: (!) 139/107 (!) 148/75 (!) 116/57 139/88  Pulse: (!) 103 (!) 112 100 95  Resp: '18 18 16 16  '$ Temp: 98.6 F (37 C)     TempSrc: Oral     SpO2: 100% 99% 100% 100%  Weight:      Height:       Physical Exam General: thin, frail, elderly male in NAD Heart: RRR, no mrg Lungs: CTAB, nml WOB on RA Abdomen: soft, NTND Extremities: no LE edema Dialysis Access: [facility]   Filed Weights   01/12/23 0455 01/13/23 0545  Weight: 88 kg 88 kg    Intake/Output Summary (Last 24 hours) at 01/15/2023 0929 Last data filed at 01/14/2023 1900 Gross per 24 hour  Intake 170 ml  Output --  Net 170 ml     Additional Objective Labs: Basic Metabolic Panel: Recent Labs  Lab 01/11/23 0227 01/12/23 0152 01/13/23 1624  NA 134* 134* 136  K 4.0 4.5 4.7  CL 98 99 101  CO2 '27 24 25  '$ GLUCOSE 89 126* 145*  BUN 20 36* 54*  CREATININE 2.28* 3.40* 4.60*  CALCIUM 9.3 9.6 10.1  PHOS 3.7 4.4 4.9*    Liver Function Tests: Recent Labs  Lab 01/11/23 0227 01/12/23 0152 01/13/23 1624  ALBUMIN 2.0* 1.9* 2.0*    CBC: Recent Labs  Lab 01/10/23 1459 01/13/23 1623 01/15/23 0820  WBC 9.4 10.5 13.5*  HGB 8.3* 8.2* 8.2*  HCT 26.5* 27.0* 26.7*  MCV 101.1* 103.1* 103.1*  PLT 380 360 325    CBG: Recent Labs  Lab 01/14/23 0810 01/14/23 1204 01/14/23 1612 01/14/23 2113 01/15/23 0742  GLUCAP 139* 127* 106* 117* 98    Medications:  sodium chloride Stopped (01/06/23 0425)   anticoagulant sodium citrate      (feeding supplement) PROSource Plus  30 mL Oral BID BM   acetaminophen  650 mg Oral TID   allopurinol  100 mg Oral Daily   aspirin EC  81 mg Oral Daily   atorvastatin  40 mg Oral Daily   Chlorhexidine Gluconate Cloth  6 each  Topical Q0600   cycloSPORINE  1 drop Both Eyes BID   darbepoetin (ARANESP) injection - DIALYSIS  200 mcg Subcutaneous Q Fri-1800   feeding supplement  237 mL Oral TID WC   gabapentin  100 mg Oral TID   Gerhardt's butt cream   Topical BID   heparin  4,000 Units Dialysis Once in dialysis   heparin  5,000 Units Subcutaneous Q8H   heparin sodium (porcine)       heparin sodium (porcine)       heparin sodium (porcine)  3,000 Units Intravenous Once   heparin sodium (porcine)  4,000 Units Intravenous Once   isosorbide dinitrate  5 mg Oral BID   melatonin  4.5 mg Oral QHS   multivitamin  1 tablet Oral QHS   polyethylene glycol  17 g Oral BID   senna-docusate  2 tablet Oral BID   sodium chloride flush  3 mL Intravenous Q12H    Dialysis Orders: [facility] MWF 4h  400/800  84.5kg  2/2.5 bath   Heparin 4000 units  + 3000 mid treatment  [facility] TDC - last OP HD 12/20, post wt 85.9kg - last IP hD  12/24, post wt 85kg - mircera 225 mcg IV q2, last 12/18, due 1/02 - no vdra   Assessment/Plan: ESRD - on HD MWF . He reports history of vertigo and how it worsens on HD. Plan for HD in chair as tolerated. Has been tolerating dialysis in chair for last 3 treatments. Discussed these will not be available at outpatient dialysis.  Could try giving pain meds pre HD to see how he tolerates and simulate outpatient setting.  Would benefit from having designated cushion to use during dialysis to help with off loading pressure on wound.  If unable to tolerate will not be appropriate for outpatient dialysis.  Sacral decubitus ulcer/Stage IV - Per primary. Completed hydrotherapy/course of antibiotics. WOC following Chronic cholecystitis - w/ nausea, ( now resolved )  malnutrition, FTT.  S/p laparoscopic cholecystectomy. Chronic HFrEF - LVEF up to 50-55% here, diffuse CAD.  HTN/ volume - BP remain soft/stable. Not on any anti-hypertensives. No volume excess. UF as tolerated. Keep SBP >100. Bed weights up, doubt accuracy.  Anemia  esrd -Last Hgb 8.2 S/p 1 unit PRBC, Aranesp 200 q Friday. Tsat low, ferritin high.  Hold IV iron for now.  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 No binder or VDRA currently. On regular diet because of FTT/nutrition DM2 - per pmd   Nutrition -Albumin low 2.0 PCM, FTT. On regular diet + protein supp per RD. Appetite improved and plans for PEG cancelled.  [place]- Palliative Care was consulted --Full code/full scope of care.  Dispo - Planned for SNF but now unsure if appropriate for outpatient HD.   Lynnda Child PA-C [facility] 01/15/2023,9:29 AM

## 2023-01-15 NOTE — Progress Notes (Signed)
[facility] PROGRESS NOTE   [name] TGG:[id number] DOB: 16-Sep-1946 DOA: 12/12/2022  PCP: Redmond School, MD  Brief History/Interval Summary: 77 year old male with past medical history of ESRD on hemodialysis Monday Wednesday Friday, HFrEF, diabetes mellitus type 2, hypertension who presented with nausea. Patient has known history of cholecystitis and underwent cholecystostomy tube placement. His tube had become dislodged and he underwent evaluation with surgery. He was noted to have recanalization of the bile duct therefore tube was not replaced. He came to hospital for elective cholecystectomy underwent preop evaluation with cardiology. His workup was notable for diffuse T wave inversions and prolonged QTc. Also was found to be significantly hypokalemic. He was started on electrolyte replacement. Nephrology was consulted for chronic hemodialysis during hospitalization. He was transferred to [facility] for cath and noted to have multivessel diffuse moderate CAD without obvious flow limiting lesion.  He was cleared for surgery and is now post cholecystecomy on 12/28.  Hospitalization c/b presence of sacral decubitus ulcer.  S/p hydrotherapy.  Currently on antibiotics.  Issue now is poor PO intake.  Had NG tube in place briefly.  Watching PO intake at this time to make decision regarding pursuing possible PEG.       Consultants: Nephrology.  Cardiology.  Palliative care    Subjective/Interval History: Patient slept well overnight.  He is complaining of pain in his buttock area from his chronic wounds.  Overnight episode of fever noted.  Patient mentions that he was able to sit up for dialysis yesterday.  He denies any new complaints.  Specifically denies any shortness of breath cough abdominal pain.   Assessment/Plan:  Stage IV sacral decubitus -wound culture growing e. Coli resistant to ampicillin/unasyn, e faecalis resistant to vancomycin, pansensitive proteus  -completed course of  hydrotherapy -CT with subcutaneous fat straining with complex collection posterior to the sacrum on the left measuring 2.5x1.2cm concerning for abscess, no evidence of osteo - vancomycin 1/3 - 1/5 - cefepime 1/3 - 1/9. E. Coli and proteus treated x 7 days with cefepime.   - flagyl 1/3-1/9  - ampicillin 1/8 - 1/15. E. Faecalis treated with ampicillin complete 1/15.   -sacral MRI could not be done due to the presence of the nerve stimulator -per surgery, no abscess but packing in the place of ulcer. -Continue oxycodone 5 mg every 4 hours as needed -Continue local wound care as per wound care RN recommendation Patient was noted to be febrile on 1/22.  WBC noted to be higher today.  However fever appears to have resolved. Based on his previous cultures we will place him on Augmentin for now. If he has fever again we will need to obtain blood cultures.  Chronic systolic and diastolic CHF Echocardiogram showed EF to be 50 to 55%.  Previous EF was 35 to 40% in August 2023. Underwent cardiac catheterization which showed multivessel diffuse moderate CAD without any acute or obvious flow-limiting lesions. Medical management being continued. Noted to be on aspirin, statin, nitrates. Patient is stable from cardiac standpoint.  End-stage renal disease Nephrology is following.  Dialyzed on Monday Wednesday Friday schedule. Was able to sit for his dialysis session on Friday but did require pain medicines.  To be attempted again today.  Nephrology recommends cushion to see if that would help.   He needs to be able to sit for the sessions before he can be accepted at an outpatient dialysis clinic. He was able to sit for his dialysis sessions on Friday and Monday.  To be  tried again today in sitting position.  May need premedication with pain meds for his sacral wounds before he goes for dialysis so that he is able to sit for the sessions.  Chronic cholecystitis Now status post cholecystectomy.  Per general  surgery; pneumoperitoneum is likely from lap chole. Stable.  Constipation Continue bowel regimen.  The last bowel movement that is charted is from 1/19.  Dose of MiraLAX was increased.  Has not had a bowel movement yet.  Will use enema today.  Anemia of chronic kidney disease No evidence for overt bleeding.  Hemoglobin stable.  Diabetes mellitus type 2 A1c 4.4 (likely reflecting his recent failure to thrive, poor PO intake).   Functional Quadriplegia  Essentially bedbound for the past year (needs help toileting, turning in bed, dressing, etc).  Hypophosphatemia/Hypomagnesemia -Repleted   Moderate Protein calorie malnutrition  Poor PO intake -BMI 22.98 kg/m -had cortrak to supplement nutrition in setting of his decubitus ulcer, but this was d/c'd after about 1 week  Oral intake has significantly improved.  No need for PEG tube at this time.  DVT Prophylaxis: Subcutaneous heparin Code Status: Full code Family Communication: Discussed with patient Disposition Plan: He needs to sit for his dialysis session before he can go to SNF as he will otherwise not be accepted into any of the outpatient HD clinics.  Patient was able to sit on chair for his dialysis on Friday and Monday.  Did have some pain issues.  To be tried again today.  It appears to have resolved.  To be placed back on oral antibiotics.  Status is: Inpatient Remains inpatient appropriate because: Poor oral intake   Medications: Scheduled:  (feeding supplement) PROSource Plus  30 mL Oral BID BM   acetaminophen  650 mg Oral TID   allopurinol  100 mg Oral Daily   aspirin EC  81 mg Oral Daily   atorvastatin  40 mg Oral Daily   Chlorhexidine Gluconate Cloth  6 each Topical Q0600   cycloSPORINE  1 drop Both Eyes BID   darbepoetin (ARANESP) injection - DIALYSIS  200 mcg Subcutaneous Q Fri-1800   feeding supplement  237 mL Oral TID WC   gabapentin  100 mg Oral TID   Gerhardt's butt cream   Topical BID   heparin  4,000  Units Dialysis Once in dialysis   heparin  5,000 Units Subcutaneous Q8H   heparin sodium (porcine)       heparin sodium (porcine)       heparin sodium (porcine)  3,000 Units Intravenous Once   heparin sodium (porcine)  4,000 Units Intravenous Once   isosorbide dinitrate  5 mg Oral BID   melatonin  4.5 mg Oral QHS   multivitamin  1 tablet Oral QHS   polyethylene glycol  17 g Oral BID   senna-docusate  2 tablet Oral BID   sodium chloride flush  3 mL Intravenous Q12H   Continuous:  sodium chloride Stopped (01/06/23 0425)   anticoagulant sodium citrate     ZHG:DJMEQA chloride, acetaminophen, alteplase, anticoagulant sodium citrate, bisacodyl, heparin, heparin, heparin sodium (porcine), heparin sodium (porcine), LORazepam, nitroGLYCERIN, oxyCODONE, polyethylene glycol, polyvinyl alcohol, prochlorperazine, simethicone, sorbitol, traMADol  Antibiotics: Anti-infectives (From admission, onward)    Start     Dose/Rate Route Frequency Ordered Stop   12/30/22 2200  ampicillin (OMNIPEN) 2 g in sodium chloride 0.9 % 100 mL IVPB        2 g 300 mL/hr over 20 Minutes Intravenous Every 12 hours 12/30/22 1519 01/06/23  0445   12/30/22 1145  linezolid (ZYVOX) IVPB 600 mg  Status:  Discontinued        600 mg 300 mL/hr over 60 Minutes Intravenous Every 12 hours 12/30/22 1056 12/30/22 1519   12/28/22 0000  vancomycin (VANCOCIN) IVPB 1000 mg/200 mL premix  Status:  Discontinued        1,000 mg 200 mL/hr over 60 Minutes Intravenous Every M-W-F (Hemodialysis) 12/27/22 1354 12/30/22 1056   12/27/22 1445  vancomycin (VANCOCIN) IVPB 1000 mg/200 mL premix        1,000 mg 200 mL/hr over 60 Minutes Intravenous  Once 12/27/22 1354 12/27/22 1738   12/27/22 1200  vancomycin (VANCOCIN) IVPB 1000 mg/200 mL premix  Status:  Discontinued        1,000 mg 200 mL/hr over 60 Minutes Intravenous Every M-W-F (Hemodialysis) 12/25/22 1831 12/27/22 1354   12/26/22 2100  ceFEPIme (MAXIPIME) 1 g in sodium chloride 0.9 % 100 mL  IVPB        1 g 200 mL/hr over 30 Minutes Intravenous Every 24 hours 12/26/22 1052 12/31/22 2034   12/25/22 1900  ceFEPIme (MAXIPIME) 1 g in sodium chloride 0.9 % 100 mL IVPB  Status:  Discontinued        1 g 200 mL/hr over 30 Minutes Intravenous Every 24 hours 12/25/22 1812 12/26/22 1052   12/25/22 1900  vancomycin (VANCOREADY) IVPB 1750 mg/350 mL        1,750 mg 175 mL/hr over 120 Minutes Intravenous  Once 12/25/22 1812 12/26/22 0146   12/25/22 1900  metroNIDAZOLE (FLAGYL) IVPB 500 mg        500 mg 100 mL/hr over 60 Minutes Intravenous Every 12 hours 12/25/22 1812 01/01/23 0700   12/13/22 0600  cefoTEtan (CEFOTAN) 2 g in sodium chloride 0.9 % 100 mL IVPB  Status:  Discontinued        2 g 200 mL/hr over 30 Minutes Intravenous On call to O.R. 12/12/22 1631 12/14/22 0559   12/12/22 0615  cefoTEtan (CEFOTAN) 2 g in sodium chloride 0.9 % 100 mL IVPB  Status:  Discontinued        2 g 200 mL/hr over 30 Minutes Intravenous On call to O.R. 12/12/22 0614 12/12/22 0913       Objective:  Vital Signs  Vitals:   01/15/23 0819 01/15/23 0825 01/15/23 0830 01/15/23 0900  BP: (!) 139/107 (!) 148/75 (!) 116/57 139/88  Pulse: (!) 103 (!) 112 100 95  Resp: '18 18 16 16  '$ Temp: 98.6 F (37 C)     TempSrc: Oral     SpO2: 100% 99% 100% 100%  Weight:      Height:        Intake/Output Summary (Last 24 hours) at 01/15/2023 0936 Last data filed at 01/14/2023 1900 Gross per 24 hour  Intake 170 ml  Output --  Net 170 ml    Filed Weights   01/12/23 0455 01/13/23 0545  Weight: 88 kg 88 kg    General appearance: Awake alert.  In no distress Resp: Clear to auscultation bilaterally.  Normal effort Cardio: S1-S2 is normal regular.  No S3-S4.  No rubs murmurs or bruit GI: Abdomen is soft.  Nontender nondistended.  Bowel sounds are present normal.  No masses organomegaly     Lab Results:  Data Reviewed: I have personally reviewed following labs and reports of the imaging studies  CBC: Recent  Labs  Lab 01/10/23 1459 01/13/23 1623 01/15/23 0820  WBC 9.4 10.5 13.5*  HGB 8.3*  8.2* 8.2*  HCT 26.5* 27.0* 26.7*  MCV 101.1* 103.1* 103.1*  PLT 380 360 325     Basic Metabolic Panel: Recent Labs  Lab 01/09/23 0217 01/10/23 0314 01/11/23 0227 01/12/23 0152 01/13/23 1624  NA 135 136 134* 134* 136  K 4.6 4.5 4.0 4.5 4.7  CL 98 100 98 99 101  CO2 '28 28 27 24 25  '$ GLUCOSE 77 84 89 126* 145*  BUN 20 33* 20 36* 54*  CREATININE 2.38* 3.48* 2.28* 3.40* 4.60*  CALCIUM 9.6 9.9 9.3 9.6 10.1  PHOS 4.0 5.3* 3.7 4.4 4.9*     GFR: Estimated Creatinine Clearance: 16.1 mL/min (A) (by C-G formula based on SCr of 4.6 mg/dL (H)).  Liver Function Tests: Recent Labs  Lab 01/09/23 0217 01/10/23 0314 01/11/23 0227 01/12/23 0152 01/13/23 1624  ALBUMIN 2.0* 1.9* 2.0* 1.9* 2.0*       CBG: Recent Labs  Lab 01/14/23 0810 01/14/23 1204 01/14/23 1612 01/14/23 2113 01/15/23 0742  GLUCAP 139* 127* 106* 117* 98      Radiology Studies: No results found.     LOS: 33 days   Emmett Bracknell [employer] on [email or web address]  01/15/2023, 9:36 AM

## 2023-01-15 NOTE — Progress Notes (Signed)
Occupational Therapy Treatment Patient Details Name: [name] MRN: [id number] DOB: 1946/12/20 Today's Date: 01/15/2023   History of present illness 77 yo male admitted to [facility] 12/21 for cholecystectomy which was canceled due to hypokalemia and prolonged Qtc. Transfer to [facility] 12/26 with cardiac cath performed same date. 12/28 lap chole. +sacral ulcer  PMHx: ESRD on HD MWF, HFrEF, DM, HTN, anxiety, HLD, Bil TKA, gout, GERD   OT comments  Patient received in supine with complaints of sacral pain. Attempted to position patient to address but continued to have discomfort. Patient was setup for self feeding with little to no spillage and limited appetite.  Patient was able to manage utensil, cup and washcloth to wash face. Patient would benefit from further OT services to address grooming, self feeding, and UE HEP.    Recommendations for follow up therapy are one component of a multi-disciplinary discharge planning process, led by the attending physician.  Recommendations may be updated based on patient status, additional functional criteria and insurance authorization.    Follow Up Recommendations  Skilled nursing-short term rehab (<3 hours/day)     Assistance Recommended at Discharge Frequent or constant Supervision/Assistance  Patient can return home with the following  Two people to help with walking and/or transfers;Two people to help with bathing/dressing/bathroom   Equipment Recommendations  Hospital bed;Other (comment) (air mattress, hoyer lift)    Recommendations for Other Services      Precautions / Restrictions Precautions Precautions: Fall;Other (comment) Precaution Comments: sacral wound Restrictions Weight Bearing Restrictions: No       Mobility Bed Mobility Overal bed mobility: Needs Assistance Bed Mobility: Rolling Rolling: Max assist         General bed mobility comments: rolling to assist with sacrum pressure    Transfers Overall transfer level: Needs  assistance                 General transfer comment: unable     Balance       Sitting balance - Comments: declined sitting on EOB                                   ADL either performed or assessed with clinical judgement   ADL Overall ADL's : Needs assistance/impaired Eating/Feeding: Set up;Supervision/ safety;Bed level Eating/Feeding Details (indicate cue type and reason): patient able to feed self soup with HOB up and little to no spillage using RUE Grooming: Wash/dry hands;Wash/dry face;Minimal assistance;Bed level Grooming Details (indicate cue type and reason): min assist to complete                               General ADL Comments: focused on self feeding and grooming    Extremity/Trunk Assessment              Vision       Perception     Praxis      Cognition Arousal/Alertness: Awake/alert Behavior During Therapy: Flat affect Overall Cognitive Status: No family/caregiver present to determine baseline cognitive functioning                               Problem Solving: Slow processing, Difficulty sequencing, Decreased initiation, Requires verbal cues, Requires tactile cues General Comments: Patient focused on sacral pain        Exercises  Shoulder Instructions       General Comments      Pertinent Vitals/ Pain       Pain Assessment Pain Assessment: Faces Faces Pain Scale: Hurts even more Pain Location: sacrum Pain Descriptors / Indicators: Discomfort, Grimacing, Guarding Pain Intervention(s): Limited activity within patient's tolerance, Monitored during session, Premedicated before session  Home Living                                          Prior Functioning/Environment              Frequency  Min 2X/week        Progress Toward Goals  OT Goals(current goals can now be found in the care plan section)  Progress towards OT goals: Not progressing toward goals  - comment (limited by pain)  Acute Rehab OT Goals Patient Stated Goal: decreased pain OT Goal Formulation: With patient Time For Goal Achievement: 01/26/23 Potential to Achieve Goals: Fair ADL Goals Pt Will Perform Eating: with modified independence;sitting;with adaptive utensils Pt Will Perform Grooming: sitting;with set-up Pt Will Perform Upper Body Bathing: with set-up;sitting Pt Will Perform Lower Body Dressing: with min assist Pt Will Transfer to Toilet: stand pivot transfer;with mod assist;bedside commode Pt Will Perform Toileting - Clothing Manipulation and hygiene: sitting/lateral leans;sit to/from stand;with mod assist Pt/caregiver will Perform Home Exercise Program: Increased ROM;Increased strength;Both right and left upper extremity;With Supervision;With minimal assist  Plan Discharge plan remains appropriate    Co-evaluation                 AM-PAC OT "6 Clicks" Daily Activity     Outcome Measure   Help from another person eating meals?: A Little Help from another person taking care of personal grooming?: A Little Help from another person toileting, which includes using toliet, bedpan, or urinal?: Total Help from another person bathing (including washing, rinsing, drying)?: Total Help from another person to put on and taking off regular upper body clothing?: A Lot Help from another person to put on and taking off regular lower body clothing?: Total 6 Click Score: 11    End of Session    OT Visit Diagnosis: Unsteadiness on feet (R26.81);Muscle weakness (generalized) (M62.81);Pain   Activity Tolerance Patient limited by pain   Patient Left in bed;with call bell/phone within reach   Nurse Communication Mobility status;Other (comment) (continued compaints of sacral pain)        Time: 6010-9323 OT Time Calculation (min): 17 min  Charges: OT General Charges $OT Visit: 1 Visit OT Treatments $Self Care/Home Management : 8-22 mins  Lodema Hong, [place]  Office [phone]   Trixie Dredge 01/15/2023, 2:44 PM

## 2023-01-16 LAB — CBC
HCT: 28.7 % — ABNORMAL LOW (ref 39.0–52.0)
Hemoglobin: 9.3 g/dL — ABNORMAL LOW (ref 13.0–17.0)
MCH: 32.6 pg (ref 26.0–34.0)
MCHC: 32.4 g/dL (ref 30.0–36.0)
MCV: 100.7 fL — ABNORMAL HIGH (ref 80.0–100.0)
Platelets: 276 10*3/uL (ref 150–400)
RBC: 2.85 MIL/uL — ABNORMAL LOW (ref 4.22–5.81)
RDW: 18.2 % — ABNORMAL HIGH (ref 11.5–15.5)
WBC: 11.7 10*3/uL — ABNORMAL HIGH (ref 4.0–10.5)
nRBC: 0 % (ref 0.0–0.2)

## 2023-01-16 LAB — GLUCOSE, CAPILLARY
Glucose-Capillary: 100 mg/dL — ABNORMAL HIGH (ref 70–99)
Glucose-Capillary: 104 mg/dL — ABNORMAL HIGH (ref 70–99)
Glucose-Capillary: 90 mg/dL (ref 70–99)
Glucose-Capillary: 105 mg/dL — ABNORMAL HIGH (ref 70–99)

## 2023-01-16 MED ORDER — AMOXICILLIN-POT CLAVULANATE 500-125 MG PO TABS: 1.0000 | ORAL_TABLET | Freq: Every day | ORAL | Status: DC

## 2023-01-16 MED ORDER — PROCHLORPERAZINE MALEATE 5 MG PO TABS: 5.0000 mg | ORAL_TABLET | Freq: Four times a day (QID) | ORAL | 0 refills | Status: DC | PRN

## 2023-01-16 MED ORDER — OXYCODONE HCL 5 MG PO TABS: 2.5000 mg | ORAL_TABLET | ORAL | 0 refills | Status: DC | PRN

## 2023-01-16 MED ORDER — POLYETHYLENE GLYCOL 3350 17 G PO PACK: 17.0000 g | PACK | Freq: Two times a day (BID) | ORAL | 0 refills | Status: DC

## 2023-01-16 NOTE — Progress Notes (Signed)
Progress Note  28 Days Post-Op  Subjective: Pt denies abdominal pain but reports some occasional constipation. He is sometimes incontinent of stool. Reports that sacral dressing is being changed once daily and they are applying medihoney and moist gauze.   Objective: Vital signs in last 24 hours: Temp:  [98.1 F (36.7 C)-100 F (37.8 C)] 98.2 F (36.8 C) (01/25 0727) Pulse Rate:  [87-107] 94 (01/25 0727) Resp:  [13-25] 18 (01/25 0727) BP: (96-141)/(54-103) 141/66 (01/25 0727) SpO2:  [93 %-100 %] 98 % (01/25 0727) Last BM Date : 01/15/23  Intake/Output from previous day: 01/24 0701 - 01/25 0700 In: -  Out: 500  Intake/Output this shift: No intake/output data recorded.  PE: Abd: soft, NT, ND, incisions c/d/I Wound: necrotic tissue in wound base without concern for active infection    Lab Results:  Recent Labs    01/15/23 0820 01/16/23 0544  WBC 13.5* 11.7*  HGB 8.2* 9.3*  HCT 26.7* 28.7*  PLT 325 276   BMET Recent Labs    01/13/23 1624 01/15/23 1551  NA 136 133*  K 4.7 3.9  CL 101 92*  CO2 25 28  GLUCOSE 145* 125*  BUN 54* 20  CREATININE 4.60* 2.25*  CALCIUM 10.1 9.5   PT/INR No results for input(s): "LABPROT", "INR" in the last 72 hours. CMP     Component Value Date/Time   NA 133 (L) 01/15/2023 1551   NA 145 (H) 01/02/2021 1548   K 3.9 01/15/2023 1551   CL 92 (L) 01/15/2023 1551   CO2 28 01/15/2023 1551   GLUCOSE 125 (H) 01/15/2023 1551   BUN 20 01/15/2023 1551   BUN 35 (H) 01/02/2021 1548   CREATININE 2.25 (H) 01/15/2023 1551   CREATININE 2.49 (H) 09/18/2015 1359   CALCIUM 9.5 01/15/2023 1551   PROT 6.5 12/22/2022 0122   PROT 7.1 12/14/2020 1243   ALBUMIN 2.1 (L) 01/15/2023 1551   ALBUMIN 4.3 12/14/2020 1243   AST 21 12/22/2022 0122   ALT <5 12/22/2022 0122   ALKPHOS 47 12/22/2022 0122   BILITOT <0.1 (L) 12/22/2022 0122   BILITOT 0.4 12/14/2020 1243   GFRNONAA 29 (L) 01/15/2023 1551   GFRAA 40 (L) 01/02/2021 1548   Lipase      Component Value Date/Time   LIPASE 21 11/04/2022 0907       Studies/Results: No results found.  Anti-infectives: Anti-infectives (From admission, onward)    Start     Dose/Rate Route Frequency Ordered Stop   01/16/23 0000  amoxicillin-clavulanate (AUGMENTIN) 500-125 MG tablet        1 tablet Oral Daily-1800 01/16/23 0932 01/23/23 2359   01/15/23 1800  amoxicillin-clavulanate (AUGMENTIN) 500-125 MG per tablet 1 tablet        1 tablet Oral Daily-1800 01/15/23 1102     12/30/22 2200  ampicillin (OMNIPEN) 2 g in sodium chloride 0.9 % 100 mL IVPB        2 g 300 mL/hr over 20 Minutes Intravenous Every 12 hours 12/30/22 1519 01/06/23 0445   12/30/22 1145  linezolid (ZYVOX) IVPB 600 mg  Status:  Discontinued        600 mg 300 mL/hr over 60 Minutes Intravenous Every 12 hours 12/30/22 1056 12/30/22 1519   12/28/22 0000  vancomycin (VANCOCIN) IVPB 1000 mg/200 mL premix  Status:  Discontinued        1,000 mg 200 mL/hr over 60 Minutes Intravenous Every M-W-F (Hemodialysis) 12/27/22 1354 12/30/22 1056   12/27/22 1445  vancomycin (VANCOCIN) IVPB 1000  mg/200 mL premix        1,000 mg 200 mL/hr over 60 Minutes Intravenous  Once 12/27/22 1354 12/27/22 1738   12/27/22 1200  vancomycin (VANCOCIN) IVPB 1000 mg/200 mL premix  Status:  Discontinued        1,000 mg 200 mL/hr over 60 Minutes Intravenous Every M-W-F (Hemodialysis) 12/25/22 1831 12/27/22 1354   12/26/22 2100  ceFEPIme (MAXIPIME) 1 g in sodium chloride 0.9 % 100 mL IVPB        1 g 200 mL/hr over 30 Minutes Intravenous Every 24 hours 12/26/22 1052 12/31/22 2034   12/25/22 1900  ceFEPIme (MAXIPIME) 1 g in sodium chloride 0.9 % 100 mL IVPB  Status:  Discontinued        1 g 200 mL/hr over 30 Minutes Intravenous Every 24 hours 12/25/22 1812 12/26/22 1052   12/25/22 1900  vancomycin (VANCOREADY) IVPB 1750 mg/350 mL        1,750 mg 175 mL/hr over 120 Minutes Intravenous  Once 12/25/22 1812 12/26/22 0146   12/25/22 1900  metroNIDAZOLE  (FLAGYL) IVPB 500 mg        500 mg 100 mL/hr over 60 Minutes Intravenous Every 12 hours 12/25/22 1812 01/01/23 0700   12/13/22 0600  cefoTEtan (CEFOTAN) 2 g in sodium chloride 0.9 % 100 mL IVPB  Status:  Discontinued        2 g 200 mL/hr over 30 Minutes Intravenous On call to O.R. 12/12/22 1631 12/14/22 0559   12/12/22 0615  cefoTEtan (CEFOTAN) 2 g in sodium chloride 0.9 % 100 mL IVPB  Status:  Discontinued        2 g 200 mL/hr over 30 Minutes Intravenous On call to O.R. 12/12/22 0614 12/12/22 0913        Assessment/Plan S/p laparoscopic cholecystectomy 12/28 - incisions well healing - pt reports mild constipation - recommend colace and or miralax prn - abdominal exam benign  - no further post-op follow up needed for this   Sacral wound - some necrotic tissue present without surrounding erythema or cellulitis, does not require surgical debridement  - recommend medihoney with dry gauze packing, increase dressing changes to BID - alleviate pressure as able - recommend follow up with wound care center   General surgery will sign off  LOS: 34 days     Norm Parcel, [facility] 01/16/2023, 10:40 AM Please see Amion for pager number during day hours 7:00am-4:30pm

## 2023-01-16 NOTE — Progress Notes (Signed)
[facility] Progress Note   Subjective:  Seen in room. Sat in recliner for dialysis yesterday, did ok but he was sore afterwards. Plans for discharge to SNF when bed available    Objective Vitals:   01/15/23 2000 01/15/23 2127 01/16/23 0334 01/16/23 0727  BP: (!) 108/54 117/65 (!) 124/103 (!) 141/66  Pulse: 96 92 88 94  Resp: '18 20 18 18  '$ Temp: 99.3 F (37.4 C) 98.3 F (36.8 C) 98.1 F (36.7 C) 98.2 F (36.8 C)  TempSrc: Oral  Oral Oral  SpO2: 96% 100% 96% 98%  Weight:      Height:       Physical Exam General: thin, frail, elderly male in NAD Heart: RRR, no mrg Lungs: CTAB, nml WOB on RA Abdomen: soft, NTND Extremities: no LE edema Dialysis Access: [facility]   Filed Weights   01/12/23 0455 01/13/23 0545  Weight: 88 kg 88 kg    Intake/Output Summary (Last 24 hours) at 01/16/2023 1113 Last data filed at 01/15/2023 1219 Gross per 24 hour  Intake --  Output 500 ml  Net -500 ml     Additional Objective Labs: Basic Metabolic Panel: Recent Labs  Lab 01/12/23 0152 01/13/23 1624 01/15/23 1551  NA 134* 136 133*  K 4.5 4.7 3.9  CL 99 101 92*  CO2 '24 25 28  '$ GLUCOSE 126* 145* 125*  BUN 36* 54* 20  CREATININE 3.40* 4.60* 2.25*  CALCIUM 9.6 10.1 9.5  PHOS 4.4 4.9* 2.9    Liver Function Tests: Recent Labs  Lab 01/12/23 0152 01/13/23 1624 01/15/23 1551  ALBUMIN 1.9* 2.0* 2.1*    CBC: Recent Labs  Lab 01/10/23 1459 01/13/23 1623 01/15/23 0820 01/16/23 0544  WBC 9.4 10.5 13.5* 11.7*  HGB 8.3* 8.2* 8.2* 9.3*  HCT 26.5* 27.0* 26.7* 28.7*  MCV 101.1* 103.1* 103.1* 100.7*  PLT 380 360 325 276    CBG: Recent Labs  Lab 01/14/23 1612 01/14/23 2113 01/15/23 0742 01/15/23 1703 01/16/23 0731  GLUCAP 106* 117* 98 132* 100*    Medications:  sodium chloride Stopped (01/06/23 0425)    (feeding supplement) PROSource Plus  30 mL Oral BID BM   acetaminophen  650 mg Oral TID   allopurinol  100 mg Oral Daily   amoxicillin-clavulanate  1 tablet  Oral q1800   aspirin EC  81 mg Oral Daily   atorvastatin  40 mg Oral Daily   Chlorhexidine Gluconate Cloth  6 each Topical Q0600   cycloSPORINE  1 drop Both Eyes BID   darbepoetin (ARANESP) injection - DIALYSIS  200 mcg Subcutaneous Q Fri-1800   feeding supplement  237 mL Oral TID WC   gabapentin  100 mg Oral TID   Gerhardt's butt cream   Topical BID   heparin  5,000 Units Subcutaneous Q8H   heparin sodium (porcine)  4,000 Units Intravenous Once   isosorbide dinitrate  5 mg Oral BID   melatonin  4.5 mg Oral QHS   multivitamin  1 tablet Oral QHS   polyethylene glycol  17 g Oral BID   senna-docusate  2 tablet Oral BID   sodium chloride flush  3 mL Intravenous Q12H   sodium phosphate  1 enema Rectal Once    Dialysis Orders: [facility] MWF 4h  400/800  84.5kg  2/2.5 bath   Heparin 4000 units  + 3000 mid treatment  [facility] - last OP HD 12/20, post wt 85.9kg - last IP hD 12/24, post wt 85kg - mircera 225 mcg IV q2,  last 12/18, due 1/02 - no vdra   Assessment/Plan: ESRD - on HD MWF . He reports history of vertigo and how it worsens on HD. Plan for HD in chair as tolerated. Has been tolerating dialysis in chair for last 3 treatments. Could try giving pain meds pre HD to see how he tolerates and simulate outpatient setting.  Would benefit from having designated cushion to use during dialysis to help with off loading pressure on wound.  If unable to tolerate will not be appropriate for outpatient dialysis.  Sacral decubitus ulcer/Stage IV - Per primary. Completed hydrotherapy/course of antibiotics. No surgical intervention needed. Surgery signed off.  Continue wound care.  Chronic cholecystitis - w/ nausea, ( now resolved )  malnutrition, FTT.  S/p laparoscopic cholecystectomy. Chronic HFrEF - LVEF up to 50-55% here, diffuse CAD.  HTN/ volume - BP remain soft/stable. Not on any anti-hypertensives. No volume excess. UF as tolerated. Keep SBP >100. Bed weights up, doubt accuracy. Not tolerating much UF  this week.  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 Tsat low, ferritin high.  Hold IV iron for now.  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 No binder or VDRA currently. On regular diet because of FTT/nutrition DM2 - per pmd   Nutrition -Albumin low 2.0 PCM, FTT. On regular diet + protein supp per RD. Appetite improved and plans for PEG cancelled.  [place]- Palliative Care was consulted --Full code/full scope of care.  Dispo - Plan for SNF   [place] PA-C [facility] 01/16/2023,11:13 AM

## 2023-01-16 NOTE — Progress Notes (Signed)
     Referral received for [name] for goals of care discussion. Chart reviewed and updates received from RN. Patient was followed by Palliative Medicine and as of 01/06/23 goals were clear. No new needs identified since then.   Palliative Medicine Team will sign off at this time. Please contact for any significant clinical changes necessitating palliative reinvolvement.  Thank you for your referral and allowing PMT to assist in [place] care.   Walden Field, NP Palliative Medicine Team Phone: [phone]  NO CHARGE

## 2023-01-16 NOTE — Discharge Summary (Addendum)
[facility]  Physician Discharge Summary   Patient ID: [name] MRN: [id number] DOB/AGE: [birth year] 77 y.o.  Admit date: 12/12/2022 Discharge date:   01/16/2023   PCP: Redmond School, MD  DISCHARGE DIAGNOSES:    Chronic cholecystitis   ESRD (end stage renal disease) ([place])   Coronary artery disease involving native coronary artery of native heart without angina pectoris   Diabetes mellitus type 2 in obese (HCC)   Anemia, normocytic normochromic   Essential hypertension   Heart failure with improved ejection fraction (HFimpEF) (HCC)   Pressure injury of skin of sacral region   Prolonged QT interval   Protein-calorie malnutrition, moderate   Paroxysmal atrial fibrillation ([place])   RECOMMENDATIONS FOR OUTPATIENT FOLLOW UP: Patient to continue with outpatient dialysis schedule on a Monday Wednesday Friday basis Please premedicate patient with pain medicine before he heads out for dialysis. Please reschedule his appointment with [place] surgery CBC and basic metabolic panel with hemodialysis in 1 week   Home Health: SNF Equipment/Devices: None  CODE STATUS: Full code  DISCHARGE CONDITION: fair  Diet recommendation: Regular as tolerated  INITIAL HISTORY: 77 year old male with past medical history of ESRD on hemodialysis Monday Wednesday Friday, HFrEF, diabetes mellitus type 2, hypertension who presented with nausea. Patient has known history of cholecystitis and underwent cholecystostomy tube placement. His tube had become dislodged and he underwent evaluation with surgery. He was noted to have recanalization of the bile duct therefore tube was not replaced. He came to hospital for elective cholecystectomy underwent preop evaluation with cardiology. His workup was notable for diffuse T wave inversions and prolonged QTc. Also was found to be significantly hypokalemic. He was started on electrolyte replacement. Nephrology was consulted for chronic  hemodialysis during hospitalization. He was transferred to [facility] for cath and noted to have multivessel diffuse moderate CAD without obvious flow limiting lesion.  He was cleared for surgery and is now post cholecystecomy on 12/28.  Hospitalization c/b presence of sacral decubitus ulcer.  S/p hydrotherapy.         Consultants: General surgery.  Nephrology.  Cardiology.  Palliative care   HOSPITAL COURSE:   Stage IV sacral decubitus -wound culture growing e. Coli resistant to ampicillin/unasyn, e faecalis resistant to vancomycin, pansensitive proteus  -completed course of hydrotherapy -CT with subcutaneous fat straining with complex collection posterior to the sacrum on the left measuring 2.5x1.2cm concerning for abscess, no evidence of osteo - vancomycin 1/3 - 1/5. Cefepime 1/3 - 1/9. E. Coli and proteus treated x 7 days with cefepime.  flagyl 1/3-1/9. Ampicillin 1/8 - 1/15. E. Faecalis treated with ampicillin complete 1/15.   -sacral MRI could not be done due to the presence of the nerve stimulator -per surgery, no abscess but packing in the place of ulcer. -Continue oxycodone 5 mg every 4 hours as needed -Continue local wound care as per wound care RN recommendation Patient was noted to be febrile on 1/22.  WBC was noted to be slightly higher.  Patient was complaining of pain in his lower back/buttocks.  He was started back on Augmentin with improvement in WBC and fever.  Will give him a 7-day course.  He will be followed up for this phone by general surgery in the outpatient clinic.     Chronic systolic and diastolic CHF Echocardiogram showed EF to be 50 to 55%.  Previous EF was 35 to 40% in August 2023. Underwent cardiac catheterization which showed multivessel diffuse moderate CAD without any acute or obvious flow-limiting lesions. Medical  management being continued. Noted to be on aspirin, statin, nitrates. Patient is stable from cardiac standpoint.   End-stage renal disease Nephrology  is following.  Dialyzed on Monday Wednesday Friday schedule. Was able to sit for his dialysis session on Friday but did require pain medicines.  To be attempted again today.  Nephrology recommends cushion to see if that would help.   He needs to be able to sit for the sessions before he can be accepted at an outpatient dialysis clinic. He was able to sit for his dialysis sessions on Friday Monday and Wednesday.   Will benefit from being premedicated with pain medications for his wound before he goes for dialysis.     Chronic cholecystitis Now status post cholecystectomy.  Per general surgery; pneumoperitoneum is likely from lap chole. Stable.   Constipation Continue bowel regimen.  The last bowel movement that is charted is from 1/19.  Dose of MiraLAX was increased.  He subsequently had a bowel movement on 1/24.   Anemia of chronic kidney disease No evidence for overt bleeding.  Hemoglobin stable.   Diabetes mellitus type 2 A1c 4.4 (likely reflecting his recent failure to thrive, poor PO intake).   Functional Quadriplegia  Essentially bedbound for the past year (needs help toileting, turning in bed, dressing, etc).   Hypophosphatemia/Hypomagnesemia -Repleted   Moderate Protein calorie malnutrition  Poor PO intake -BMI 22.98 kg/m -had cortrak to supplement nutrition in setting of his decubitus ulcer, but this was d/c'd after about 1 week  Oral intake has significantly improved.  No need for PEG tube at this time.   Patient is stable.  Okay for discharge to SNF when bed is available.  PERTINENT LABS:  The results of significant diagnostics from this hospitalization (including imaging, microbiology, ancillary and laboratory) are listed below for reference.     Labs:   Basic Metabolic Panel: Recent Labs  Lab 01/10/23 0314 01/11/23 0227 01/12/23 0152 01/13/23 1624 01/15/23 1551  NA 136 134* 134* 136 133*  K 4.5 4.0 4.5 4.7 3.9  CL 100 98 99 101 92*  CO2 '28 27 24 25 28   '$ GLUCOSE 84 89 126* 145* 125*  BUN 33* 20 36* 54* 20  CREATININE 3.48* 2.28* 3.40* 4.60* 2.25*  CALCIUM 9.9 9.3 9.6 10.1 9.5  PHOS 5.3* 3.7 4.4 4.9* 2.9   Liver Function Tests: Recent Labs  Lab 01/10/23 0314 01/11/23 0227 01/12/23 0152 01/13/23 1624 01/15/23 1551  ALBUMIN 1.9* 2.0* 1.9* 2.0* 2.1*    CBC: Recent Labs  Lab 01/10/23 1459 01/13/23 1623 01/15/23 0820 01/16/23 0544  WBC 9.4 10.5 13.5* 11.7*  HGB 8.3* 8.2* 8.2* 9.3*  HCT 26.5* 27.0* 26.7* 28.7*  MCV 101.1* 103.1* 103.1* 100.7*  PLT 380 360 325 276     CBG: Recent Labs  Lab 01/14/23 1612 01/14/23 2113 01/15/23 0742 01/15/23 1703 01/16/23 0731  GLUCAP 106* 117* 98 132* 100*     IMAGING STUDIES DG Abd Portable 1V  Result Date: 12/27/2022 CLINICAL DATA:  Encounter for feeding tube placement EXAM: PORTABLE ABDOMEN - 1 VIEW COMPARISON:  Abdominal radiographs 08/07/2022. FINDINGS: Feeding tube courses below the diaphragm with the tip in the expected region of the stomach. Nonobstructive bowel gas pattern. Spinal stimulator in place. Lumbar fixation. IMPRESSION: Feeding tube courses below the diaphragm with the tip in the expected region of the stomach Electronically Signed   By: Margaretha Sheffield M.D.   On: 12/27/2022 15:58   CT ABDOMEN PELVIS WO CONTRAST  Addendum Date: 12/26/2022  ADDENDUM REPORT: 12/26/2022 23:38 ADDENDUM: Critical Value/emergent results were called by telephone at the time of interpretation on 12/26/2022 at 11:37 pm to provider Dr. Bridgett Larsson, who verbally acknowledged these results. Electronically Signed   By: Brett Fairy M.D.   On: 12/26/2022 23:38   Result Date: 12/26/2022 CLINICAL DATA:  Sepsis, sacral ulcer.  Concern for osteomyelitis. EXAM: CT ABDOMEN AND PELVIS WITHOUT CONTRAST TECHNIQUE: Multidetector CT imaging of the abdomen and pelvis was performed following the standard protocol without IV contrast. RADIATION DOSE REDUCTION: This exam was performed according to the departmental  dose-optimization program which includes automated exposure control, adjustment of the mA and/or kV according to patient size and/or use of iterative reconstruction technique. COMPARISON:  11/04/2022. FINDINGS: Lower chest: The heart is normal in size and there is no pericardial effusion. Multi-vessel coronary artery calcifications are noted. The distal tip of a right central venous catheter terminates in the right atrium. Patchy atelectasis or infiltrate is present at the lung bases. Hepatobiliary: Scattered hypodensities are present in the liver which are unchanged from the prior exam. The gallbladder is surgically absent which is new from the previous exam. No biliary ductal dilatation. Pancreas: Pancreatic atrophy is noted. No pancreatic ductal dilatation or surrounding inflammatory changes. Spleen: Normal in size without focal abnormality. Adrenals/Urinary Tract: No adrenal nodule or mass. Renal cysts are present bilaterally. Renal calculi are noted bilaterally. No hydronephrosis. The bladder is unremarkable. Stomach/Bowel: Stomach is within normal limits. Appendix is not seen. No evidence of bowel wall thickening, distention, or inflammatory changes. Free air is noted in the upper abdomen. Moderate amount of retained stool is present in the colon. Vascular/Lymphatic: Aortic atherosclerosis. No enlarged abdominal or pelvic lymph nodes. Reproductive: The prostate gland is enlarged. Other: No abdominopelvic ascites. There is a fat containing umbilical hernia is noted. A focus of air is noted in the anterior abdominal wall in the region of the umbilicus. Musculoskeletal: Degenerative changes in the thoracolumbar spine. A neurostimulator device is noted in the subcutaneous tissues of the right flank with leads terminating in the thoracic spinal canal. Lumbar spinal fusion hardware is present from L2-L5. There is fat stranding and increased density in the subcutaneous tissues overlying the distal sacrum and coccyx  on the left. There is a complex collection containing air in the subcutaneous tissues posterior to the sacrum on the left measuring 2.5 x 1.2 cm. No obvious bony erosions or periosteal elevation in this region. IMPRESSION: 1. Small amount of free air in the upper abdomen with cholecystectomy changes, which may be due to recent surgery. Clinical correlation is recommended. 2. Subcutaneous fat stranding with complex collection posterior to the sacrum on the left measuring 2.5 x 1.2 cm, suspicious for abscess formation. No definite evidence of osteomyelitis. 3. Patchy airspace disease at the lung bases, possible atelectasis or infiltrate. 4. Moderate amount of retained stool in the colon suggesting constipation. 5. Aortic atherosclerosis. 6. Remaining incidental findings as described above. Electronically Signed: By: Brett Fairy M.D. On: 12/26/2022 23:30   CARDIAC CATHETERIZATION  Result Date: 12/17/2022   Prox RCA-1 lesion is 50% stenosed. Prox RCA-2 lesion is 20% stenosed.   Mid RCA to Dist RCA lesion is 65% stenosed.   RPDA lesion is 60% stenosed.   RPAV lesion is 15% stenosed.  2nd RPL lesion is 30% stenosed.   Dist LAD-1 lesion is 55% stenosed. Dist/apical LAD-2 lesion is 75% stenosed.   Dist/apical LAD-2 lesion is 75% stenosed.   Prox 3rd Mrg is 40% stenosed   LV end  diastolic pressure is normal. POST CATH DIAGNOSES Mulitvessel diffuse Moderate CAD with no acute or obvious Flow-Limiting lesions. Prox RCA eccentril 50-55%, distal RCA eccentric calcified shelf lesion ~60-65% & ostial rPDA 60% --> In tandem could potentialy be RFR/FFR positive. However, in the absence of any ongoing active anginal symptoms, would not PCI in the preop setting. Early distal LAD eccentric 55 to 60% and apical 70% stenosed this cannot amenable for PCI. Otherwise diffuse mild to moderate disease in the LCx Normal LVEDP-normal EF by Echo RECOMMENDATIONS Aggressive to direct medical therapy for moderate diffuse CAD.  Monitor for  symptoms.  But for now would simply treat medically. Would proceed with planned operation without further cardiac evaluation given nonobstructive CAD and normal echo. Glenetta Hew, MD   DISCHARGE EXAMINATION: Vitals:   01/15/23 2000 01/15/23 2127 01/16/23 0334 01/16/23 0727  BP: (!) 108/54 117/65 (!) 124/103 (!) 141/66  Pulse: 96 92 88 94  Resp: '18 20 18 18  '$ Temp: 99.3 F (37.4 C) 98.3 F (36.8 C) 98.1 F (36.7 C) 98.2 F (36.8 C)  TempSrc: Oral  Oral Oral  SpO2: 96% 100% 96% 98%  Weight:      Height:       General appearance: Awake alert.  In no distress Resp: Clear to auscultation bilaterally.  Normal effort Cardio: S1-S2 is normal regular.  No S3-S4.  No rubs murmurs or bruit GI: Abdomen is soft.  Nontender nondistended.  Bowel sounds are present normal.  No masses organomegaly   DISPOSITION: SNF  Discharge Instructions     AMB referral to wound care center   Complete by: As directed    Call MD for:  difficulty breathing, headache or visual disturbances   Complete by: As directed    Call MD for:  extreme fatigue   Complete by: As directed    Call MD for:  persistant dizziness or light-headedness   Complete by: As directed    Call MD for:  persistant nausea and vomiting   Complete by: As directed    Call MD for:  severe uncontrolled pain   Complete by: As directed    Call MD for:  temperature >100.4   Complete by: As directed    Diet general   Complete by: As directed    Discharge instructions   Complete by: As directed    Please review instructions on the discharge summary.  You were cared for by a hospitalist during your hospital stay. If you have any questions about your discharge medications or the care you received while you were in the hospital after you are discharged, you can call the unit and asked to speak with the hospitalist on call if the hospitalist that took care of you is not available. Once you are discharged, your primary care physician will handle  any further medical issues. Please note that NO REFILLS for any discharge medications will be authorized once you are discharged, as it is imperative that you return to your primary care physician (or establish a relationship with a primary care physician if you do not have one) for your aftercare needs so that they can reassess your need for medications and monitor your lab values. If you do not have a primary care physician, you can call [phone] for a physician referral.   Discharge wound care:   Complete by: As directed    Wound care: Twice daily      Comments: Line sacral wound bed with Silver hydrofiber Kellie Simmering [phone]) daily, fill remaining  defect with fluffed dry gauze.  Top with dry dressing. Use medhoney.   Increase activity slowly   Complete by: As directed           Allergies as of 01/16/2023       Reactions   Metformin Nausea And Vomiting   Niacin    Niacin And Related Other (See Comments)   Reaction: flushing and burning side effects even with extended release        Medication List     STOP taking these medications    acarbose 50 MG tablet Commonly known as: PRECOSE   ascorbic acid 500 MG tablet Commonly known as: VITAMIN C   carvedilol 3.125 MG tablet Commonly known as: COREG   Dialyvite 800-Zinc 15 0.8 MG Tabs   docusate sodium 100 MG capsule Commonly known as: Colace   epoetin alfa 3000 UNIT/ML injection Commonly known as: EPOGEN   hydrALAZINE 25 MG tablet Commonly known as: APRESOLINE   insulin NPH Human 100 UNIT/ML injection Commonly known as: NovoLIN N   isosorbide mononitrate 30 MG 24 hr tablet Commonly known as: IMDUR   Januvia 25 MG tablet Generic drug: sitaGLIPtin   multivitamin with minerals tablet   pravastatin 40 MG tablet Commonly known as: PRAVACHOL   promethazine 25 MG suppository Commonly known as: PHENERGAN   senna 8.6 MG Tabs tablet Commonly known as: SENOKOT   traMADol 50 MG tablet Commonly known as: ULTRAM    Zinc 50 MG Tabs       TAKE these medications    acetaminophen 500 MG tablet Commonly known as: TYLENOL Take 2 tablets (1,000 mg total) by mouth every 6 (six) hours.   allopurinol 100 MG tablet Commonly known as: ZYLOPRIM Take 100 mg by mouth daily.   amoxicillin-clavulanate 500-125 MG tablet Commonly known as: AUGMENTIN Take 1 tablet by mouth daily at 6 PM for 7 days.   aspirin EC 81 MG tablet Take 1 tablet (81 mg total) by mouth daily.   atorvastatin 40 MG tablet Commonly known as: LIPITOR Take 1 tablet (40 mg total) by mouth daily.   calcitRIOL 0.25 MCG capsule Commonly known as: ROCALTROL Take 0.25 mcg by mouth every Sunday.   carboxymethylcellulose 0.5 % Soln Commonly known as: REFRESH PLUS Place 1 drop into both eyes daily as needed (dry eyes).   Darbepoetin Alfa 200 MCG/0.4ML Sosy injection Commonly known as: ARANESP Inject 0.4 mLs (200 mcg total) into the skin every Friday at 6 PM.   feeding supplement Liqd Take 237 mLs by mouth 3 (three) times daily with meals.   ferrous sulfate 325 (65 FE) MG tablet Take 1 tablet (325 mg total) by mouth daily.   gabapentin 100 MG capsule Commonly known as: NEURONTIN Take 1 capsule (100 mg total) by mouth 3 (three) times daily. What changed:  medication strength how much to take when to take this   Gerhardt's butt cream Crea Apply 1 Application topically 2 (two) times daily.   isosorbide dinitrate 5 MG tablet Commonly known as: ISORDIL Take 1 tablet (5 mg total) by mouth 2 (two) times daily.   leptospermum manuka honey Pste paste Apply 1 Application topically daily.   meclizine 12.5 MG tablet Commonly known as: ANTIVERT Take 1 tablet (12.5 mg total) by mouth 3 (three) times daily as needed for dizziness.   melatonin 5 MG Tabs Take 5 mg by mouth at bedtime.   multivitamin Tabs tablet Take 1 tablet by mouth at bedtime.   nitroGLYCERIN 0.4 MG SL tablet Commonly  known as: NITROSTAT Place 1 tablet (0.4 mg  total) under the tongue every 5 (five) minutes as needed for chest pain. PLACE ONE TABLET UNDER THE TONGUE EVERY 5 MINUTES FOR 3 DOSES AS NEEDED Strength: 0.4 mg   oxyCODONE 5 MG immediate release tablet Commonly known as: Oxy IR/ROXICODONE Take 0.5-1 tablets (2.5-5 mg total) by mouth every 4 (four) hours as needed for moderate pain.   polyethylene glycol 17 g packet Commonly known as: MIRALAX / GLYCOLAX Take 17 g by mouth 2 (two) times daily. What changed: when to take this   prochlorperazine 5 MG tablet Commonly known as: COMPAZINE Take 1 tablet (5 mg total) by mouth every 6 (six) hours as needed for nausea or vomiting.   Restasis 0.05 % ophthalmic emulsion Generic drug: cycloSPORINE Place 1 drop into both eyes 2 (two) times daily.   senna-docusate 8.6-50 MG tablet Commonly known as: Senokot-S Take 2 tablets by mouth 2 (two) times daily.   simethicone 80 MG chewable tablet Commonly known as: MYLICON Chew 80 mg by mouth every 6 (six) hours as needed for flatulence.   True Metrix Blood Glucose Test test strip Generic drug: glucose blood USE TO TEST BLOOD SUGAR UP TO TWICE DAILY OR AS DIRECTED               Discharge Care Instructions  (From admission, onward)           Start     Ordered   01/16/23 0000  Discharge wound care:       Comments: Wound care: Twice daily      Comments: Line sacral wound bed with Silver hydrofiber Kellie Simmering [phone]) daily, fill remaining defect with fluffed dry gauze.  Top with dry dressing. Use medhoney.   01/16/23 1043              Follow-up Information     Maczis, Carlena Hurl, PA-C. Go on 01/09/2023.   Specialty: General Surgery Why: Your appointment is 01/09/23 at 3:30 pm Arrive 34mn early to check in, fill out paperwork, Bring photo ID and insurance information Contact information: [phone]                 TOTAL DISCHARGE TIME: 35 minutes  [facility] Pager on [email or web address]  01/16/2023, 10:43 AM

## 2023-01-17 LAB — GLUCOSE, CAPILLARY
Glucose-Capillary: 83 mg/dL (ref 70–99)
Glucose-Capillary: 109 mg/dL — ABNORMAL HIGH (ref 70–99)
Glucose-Capillary: 110 mg/dL — ABNORMAL HIGH (ref 70–99)

## 2023-01-17 LAB — RENAL FUNCTION PANEL
Albumin: 2 g/dL — ABNORMAL LOW (ref 3.5–5.0)
Anion gap: 11 (ref 5–15)
BUN: 49 mg/dL — ABNORMAL HIGH (ref 8–23)
CO2: 28 mmol/L (ref 22–32)
Calcium: 10 mg/dL (ref 8.9–10.3)
Chloride: 95 mmol/L — ABNORMAL LOW (ref 98–111)
Creatinine, Ser: 4.32 mg/dL — ABNORMAL HIGH (ref 0.61–1.24)
GFR, Estimated: 13 mL/min — ABNORMAL LOW (ref >60)
Glucose, Bld: 128 mg/dL — ABNORMAL HIGH (ref 70–99)
Phosphorus: 4.6 mg/dL (ref 2.5–4.6)
Potassium: 3.2 mmol/L — ABNORMAL LOW (ref 3.5–5.1)
Sodium: 134 mmol/L — ABNORMAL LOW (ref 135–145)

## 2023-01-17 LAB — CBC
HCT: 24.9 % — ABNORMAL LOW (ref 39.0–52.0)
Hemoglobin: 8 g/dL — ABNORMAL LOW (ref 13.0–17.0)
MCH: 32.3 pg (ref 26.0–34.0)
MCHC: 32.1 g/dL (ref 30.0–36.0)
MCV: 100.4 fL — ABNORMAL HIGH (ref 80.0–100.0)
Platelets: 328 10*3/uL (ref 150–400)
RBC: 2.48 MIL/uL — ABNORMAL LOW (ref 4.22–5.81)
RDW: 18.1 % — ABNORMAL HIGH (ref 11.5–15.5)
WBC: 11.3 10*3/uL — ABNORMAL HIGH (ref 4.0–10.5)
nRBC: 0 % (ref 0.0–0.2)

## 2023-01-17 MED ORDER — HEPARIN SODIUM (PORCINE) 1000 UNIT/ML IJ SOLN
INTRAMUSCULAR | Status: AC
  Administered 2023-01-17: 4000 [IU]
  Filled 2023-01-17: qty 4

## 2023-01-17 MED ORDER — LIDOCAINE-PRILOCAINE 2.5-2.5 % EX CREA: 1.0000 | TOPICAL_CREAM | CUTANEOUS | Status: DC | PRN

## 2023-01-17 MED ORDER — LIDOCAINE HCL (PF) 1 % IJ SOLN: 5.0000 mL | INTRAMUSCULAR | Status: DC | PRN

## 2023-01-17 MED ORDER — PENTAFLUOROPROP-TETRAFLUOROETH EX AERO: 1.0000 | INHALATION_SPRAY | CUTANEOUS | Status: DC | PRN

## 2023-01-17 MED ORDER — HEPARIN SODIUM (PORCINE) 1000 UNIT/ML IJ SOLN
INTRAMUSCULAR | Status: AC
  Administered 2023-01-17: 3800 [IU]
  Filled 2023-01-17: qty 4

## 2023-01-17 MED ORDER — HEPARIN SODIUM (PORCINE) 1000 UNIT/ML DIALYSIS: 1000.0000 [IU] | INTRAMUSCULAR | Status: DC | PRN

## 2023-01-17 MED ORDER — HEPARIN SODIUM (PORCINE) 1000 UNIT/ML DIALYSIS
4000.0000 [IU] | INTRAMUSCULAR | Status: DC | PRN
  Administered 2023-01-20: 4000 [IU] via INTRAVENOUS_CENTRAL
  Filled 2023-01-17: qty 4

## 2023-01-17 MED ORDER — ANTICOAGULANT SODIUM CITRATE 4% (200MG/5ML) IV SOLN: 5.0000 mL | Status: DC | PRN

## 2023-01-17 MED ORDER — ALTEPLASE 2 MG IJ SOLR: 2.0000 mg | Freq: Once | INTRAMUSCULAR | Status: DC | PRN

## 2023-01-17 NOTE — Progress Notes (Addendum)
Received patient in HD recliner.Awake alert and oriented x 4. Vitals were stable.  Access used : Right HD catheter that works well.Dressing change done today.  Duration of treatment 3.5 hours.  Medicine given : Heparin 4000 units bolus.  Fluid removed : 500 cc. as prescribed.  Hemodialysis treatment issue: Patient tolerated his treatment while sitting in HD recliner for 3.5 hours.Not complaining of discomfort..  Hand off to the patient's nurse.

## 2023-01-17 NOTE — Progress Notes (Signed)
[facility] Progress Note   Subjective: In recliner for HD. Discharge orders noted but SNF placement doesn't appear to be finalized yet. No issues with HD.   Objective Vitals:   01/16/23 1930 01/17/23 0339 01/17/23 0726 01/17/23 0940  BP: 123/68 (!) 146/66 135/68 (!) 142/60  Pulse: 82 86 87 88  Resp: '18  18 16  '$ Temp: 98 F (36.7 C)  98.2 F (36.8 C) 98.3 F (36.8 C)  TempSrc: Oral  Oral   SpO2: 96% 94% 100% 98%  Weight:      Height:       Physical Exam General: Chronically ill appearing male in NAD Heart: S1,S2 No M/R/G Lungs: CTA anteriorly Abdomen: soft, NABS Extremities: trace ankle edema bilaterally Dialysis Access: [facility] drsg intact   Additional Objective Labs: Basic Metabolic Panel: Recent Labs  Lab 01/12/23 0152 01/13/23 1624 01/15/23 1551  NA 134* 136 133*  K 4.5 4.7 3.9  CL 99 101 92*  CO2 '24 25 28  '$ GLUCOSE 126* 145* 125*  BUN 36* 54* 20  CREATININE 3.40* 4.60* 2.25*  CALCIUM 9.6 10.1 9.5  PHOS 4.4 4.9* 2.9   Liver Function Tests: Recent Labs  Lab 01/12/23 0152 01/13/23 1624 01/15/23 1551  ALBUMIN 1.9* 2.0* 2.1*   No results for input(s): "LIPASE", "AMYLASE" in the last 168 hours. CBC: Recent Labs  Lab 01/10/23 1459 01/13/23 1623 01/15/23 0820 01/16/23 0544 01/17/23 0742  WBC 9.4 10.5 13.5* 11.7* 11.3*  HGB 8.3* 8.2* 8.2* 9.3* 8.0*  HCT 26.5* 27.0* 26.7* 28.7* 24.9*  MCV 101.1* 103.1* 103.1* 100.7* 100.4*  PLT 380 360 325 276 328   Blood Culture    Component Value Date/Time   SDES WOUND 12/25/2022 1715   SPECREQUEST NONE 12/25/2022 1715   CULT  12/25/2022 1715    FEW ESCHERICHIA COLI ABUNDANT PROTEUS MIRABILIS ABUNDANT ENTEROCOCCUS FAECALIS VANCOMYCIN RESISTANT ENTEROCOCCUS    REPTSTATUS 12/29/2022 FINAL 12/25/2022 1715    Cardiac Enzymes: No results for input(s): "CKTOTAL", "CKMB", "CKMBINDEX", "TROPONINI" in the last 168 hours. CBG: Recent Labs  Lab 01/16/23 0731 01/16/23 1208 01/16/23 1521  01/16/23 2031 01/17/23 0722  GLUCAP 100* 104* 90 105* 83   Iron Studies: No results for input(s): "IRON", "TIBC", "TRANSFERRIN", "FERRITIN" in the last 72 hours. '@lablastinr3'$ @ Studies/Results: No results found. Medications:  sodium chloride Stopped (01/06/23 0425)    (feeding supplement) PROSource Plus  30 mL Oral BID BM   acetaminophen  650 mg Oral TID   allopurinol  100 mg Oral Daily   amoxicillin-clavulanate  1 tablet Oral q1800   aspirin EC  81 mg Oral Daily   atorvastatin  40 mg Oral Daily   Chlorhexidine Gluconate Cloth  6 each Topical Q0600   cycloSPORINE  1 drop Both Eyes BID   darbepoetin (ARANESP) injection - DIALYSIS  200 mcg Subcutaneous Q Fri-1800   feeding supplement  237 mL Oral TID WC   gabapentin  100 mg Oral TID   Gerhardt's butt cream   Topical BID   heparin  5,000 Units Subcutaneous Q8H   heparin sodium (porcine)       isosorbide dinitrate  5 mg Oral BID   melatonin  4.5 mg Oral QHS   multivitamin  1 tablet Oral QHS   polyethylene glycol  17 g Oral BID   senna-docusate  2 tablet Oral BID   sodium chloride flush  3 mL Intravenous Q12H   sodium phosphate  1 enema Rectal Once     Dialysis Orders: [facility] MWF 4h  400/800  84.5kg  2/2.5 bath   Heparin 4000 units  + 3000 mid treatment  [facility] TDC - last OP HD 12/20, post wt 85.9kg - last IP hD 12/24, post wt 85kg - mircera 225 mcg IV q2, last 12/18, due 1/02 - no vdra   Assessment/Plan: ESRD - on HD MWF . He reports history of vertigo and how it worsens on HD. Plan for HD in chair as tolerated. Has been tolerating dialysis in chair for last 3 treatments. Could try giving pain meds pre HD to see how he tolerates and simulate outpatient setting.  Would benefit from having designated cushion to use during dialysis to help with off loading pressure on wound.  If unable to tolerate will not be appropriate for outpatient dialysis.  Sacral decubitus ulcer/Stage IV - Per primary. Completed hydrotherapy/course of  antibiotics. No surgical intervention needed. Surgery signed off.  Continue wound care.  Chronic cholecystitis - w/ nausea, ( now resolved )  malnutrition, FTT.  S/p laparoscopic cholecystectomy. Chronic HFrEF - LVEF up to 50-55% here, diffuse CAD.  HTN/ volume - BP remain soft/stable. Not on any anti-hypertensives. No volume excess. UF as tolerated. Keep SBP >100. Bed weights up, doubt accuracy. Not tolerating much UF this week.  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 Tsat low, ferritin high.  Hold IV iron for now.  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 No binder or VDRA currently. On regular diet because of FTT/nutrition DM2 - per pmd   Nutrition -Albumin low 2.0 PCM, FTT. On regular diet + protein supp per RD. Appetite improved and plans for PEG cancelled.  [facility]- Palliative Care was consulted --Full code/full scope of care.  Dispo - Plan for SNF   [employer] Solmon Bohr NP-C 01/17/2023, 10:30 AM  [employer] [phone]

## 2023-01-17 NOTE — Progress Notes (Signed)
Case discussed with CSW. Insurance auth started today for snf placement at [facility] per CSW. Update provided to renal NP and out-pt clinic. CSW confirms that snf can accommodate pt's out-pt HD clinic and schedule [facility] on MWF 11: 55 chair time). Will assist as needed.   Melven Sartorius Renal Navigator [phone]

## 2023-01-17 NOTE — Progress Notes (Signed)
PT Cancellation Note  Patient Details Name: [name] MRN: [id number] DOB: 21-Jan-1946   Cancelled Treatment:    Reason Eval/Treat Not Completed: Patient at procedure or test/unavailable (Pt had just been lifted to chair with mechanical lift to go to HD when PT arrived and nurses were wheeling him to HD on arrival.  Will return at later date.)   Alvira Philips 01/17/2023, 10:03 AM Philopater Mucha M,PT [place]

## 2023-01-17 NOTE — Progress Notes (Signed)
Pt received a bed offer from [facility]. Pt has been denied by all other SNFs in [facility]. Confirmed with Destiny at [facility] [phone] they are able to accommodate pt's HD schedule: [facility] MWF 11:55 chair time. Pt aware of bed offer and has accepted.  [facility] auth submitted. [facility] is not able to admit over weekend.   Wandra Feinstein, MSW, LCSW [phone] (coverage)

## 2023-01-17 NOTE — Progress Notes (Signed)
PROGRESS NOTE    [name]  SJG:[id number] DOB: 09-23-1946 DOA: 12/12/2022 PCP: Redmond School, MD   Brief Narrative:  77 year old male with past medical history of ESRD on hemodialysis Monday Wednesday Friday, HFrEF, diabetes mellitus type 2, hypertension who presented with nausea. Patient has known history of cholecystitis and underwent cholecystostomy tube placement. His tube had become dislodged and he underwent evaluation with surgery. He was noted to have recanalization of the bile duct therefore tube was not replaced. He came to hospital for elective cholecystectomy underwent preop evaluation with cardiology. His workup was notable for diffuse T wave inversions and prolonged QTc. Also was found to be significantly hypokalemic. He was started on electrolyte replacement. Nephrology was consulted for chronic hemodialysis during hospitalization. He was transferred to [facility] for cath and noted to have multivessel diffuse moderate CAD without obvious flow limiting lesion.  He was cleared for surgery and is now post cholecystecomy on 12/28.  Hospitalization c/b presence of sacral decubitus ulcer.  S/p hydrotherapy.  Currently on antibiotics.  Issue now is poor PO intake.  Had NG tube in place briefly.  Watching PO intake at this time to make decision regarding pursuing possible PEG.     Assessment & Plan:   Principal Problem:   Chronic cholecystitis Active Problems:   ESRD (end stage renal disease) ([place])   Coronary artery disease involving native coronary artery of native heart without angina pectoris   Hypokalemia   Diabetes mellitus type 2 in obese (HCC)   Anemia, normocytic normochromic   Essential hypertension   Heart failure with improved ejection fraction (HFimpEF) (HCC)   HFrEF (heart failure with reduced ejection fraction) (HCC)   Nausea and vomiting   Pressure injury of skin of sacral region   Prolonged QT interval   Generalized weakness   Abnormal EKG   Preoperative  cardiovascular examination   Protein-calorie malnutrition, severe   Malnutrition of moderate degree   Paroxysmal atrial fibrillation (HCC)  Stage IV sacral decubitus -wound culture growing e. Coli resistant to ampicillin/unasyn, e faecalis resistant to vancomycin, pansensitive proteus  -completed course of hydrotherapy -CT with subcutaneous fat straining with complex collection posterior to the sacrum on the left measuring 2.5x1.2cm concerning for abscess, no evidence of osteo - vancomycin 1/3 - 1/5 - cefepime 1/3 - 1/9. E. Coli and proteus treated x 7 days with cefepime.   - flagyl 1/3-1/9  - ampicillin 1/8 - 1/15. E. Faecalis treated with ampicillin complete 1/15.   -sacral MRI could not be done due to the presence of the nerve stimulator -per surgery, no abscess but packing in the place of ulcer. -Continue oxycodone 5 mg every 4 hours as needed -Continue local wound care as per wound care RN recommendation Patient was noted to be febrile on 1/22.  WBC noted to be higher. However fever appears to have resolved. Based on his previous cultures patient was started on Augmentin.  Patient   Chronic systolic and diastolic CHF Echocardiogram showed EF to be 50 to 55%.  Previous EF was 35 to 40% in August 2023. Underwent cardiac catheterization which showed multivessel diffuse moderate CAD without any acute or obvious flow-limiting lesions. Medical management being continued. Noted to be on aspirin, statin, nitrates. Patient is stable from cardiac standpoint.   End-stage renal disease Nephrology is following.  Dialyzed on Monday Wednesday Friday schedule. Was able to sit for his dialysis session on Friday. He needs to be able to sit for the sessions before he can be accepted at an  outpatient dialysis clinic. He was able to sit for his dialysis for the last 2-3 sessions.  May need premedication with pain meds for his sacral wounds before he goes for dialysis so that he is able to sit for the  sessions.  Hypokalemia/hyponatremia:   Will defer to nephrology since he is a dialysis patient.  Chronic cholecystitis Now status post cholecystectomy.  Per general surgery; pneumoperitoneum is likely from lap chole. Stable.   Constipation Continue bowel regimen.  Anywhere lab   Anemia of chronic kidney disease No evidence for overt bleeding.  Hemoglobin stable.   Diabetes mellitus type 2 A1c 4.4 (likely reflecting his recent failure to thrive, poor PO intake).   Functional Quadriplegia  Essentially bedbound for the past year (needs help toileting, turning in bed, dressing, etc).   Hypophosphatemia/Hypomagnesemia -Repleted   Moderate Protein calorie malnutrition  Poor PO intake -BMI 22.98 kg/m -had cortrak to supplement nutrition in setting of his decubitus ulcer, but this was d/c'd after about 1 week  Oral intake has significantly improved.  No need for PEG tube at this time.  DVT prophylaxis: Place TED hose Start: 12/26/22 1616 heparin injection 5,000 Units Start: 12/17/22 2200 Place and maintain sequential compression device Start: 12/12/22 0932   Code Status: Full Code  Family Communication:  None present at bedside.  Status is: Inpatient Remains inpatient appropriate because: Medically stable, pending placement.   Estimated body mass index is 24.24 kg/m as calculated from the following:   Height as of this encounter: '6\' 3"'$  (1.905 m).   Weight as of this encounter: 88 kg.  Pressure Injury 11/06/22 Sacrum Mid Stage 4 - Full thickness tissue loss with exposed bone, tendon or muscle. (Active)  11/06/22 1315  Location: Sacrum  Location Orientation: Mid  Staging: Stage 4 - Full thickness tissue loss with exposed bone, tendon or muscle.  Wound Description (Comments):   Present on Admission: Yes  Dressing Type Foam - Lift dressing to assess site every shift 01/16/23 2135   Nutritional Assessment: Body mass index is 24.24 kg/m.Marland Kitchen Seen by dietician.  I agree with the  assessment and plan as outlined below: Nutrition Status: Nutrition Problem: Moderate Malnutrition Etiology: chronic illness (ESRD on HD, chronic cholecystitis) Signs/Symptoms: moderate fat depletion, moderate muscle depletion, percent weight loss Percent weight loss: 15 % (over 4 months) Interventions: Refer to RD note for recommendations  . Skin Assessment: I have examined the patient's skin and I agree with the wound assessment as performed by the wound care RN as outlined below: Pressure Injury 11/06/22 Sacrum Mid Stage 4 - Full thickness tissue loss with exposed bone, tendon or muscle. (Active)  11/06/22 1315  Location: Sacrum  Location Orientation: Mid  Staging: Stage 4 - Full thickness tissue loss with exposed bone, tendon or muscle.  Wound Description (Comments):   Present on Admission: Yes  Dressing Type Foam - Lift dressing to assess site every shift 01/16/23 2135    Consultants:  Nephrology  Procedures:  As above  Antimicrobials:  Anti-infectives (From admission, onward)    Start     Dose/Rate Route Frequency Ordered Stop   01/16/23 0000  amoxicillin-clavulanate (AUGMENTIN) 500-125 MG tablet        1 tablet Oral Daily-1800 01/16/23 0932 01/23/23 2359   01/15/23 1800  amoxicillin-clavulanate (AUGMENTIN) 500-125 MG per tablet 1 tablet        1 tablet Oral Daily-1800 01/15/23 1102 01/22/23 1759   12/30/22 2200  ampicillin (OMNIPEN) 2 g in sodium chloride 0.9 % 100 mL  IVPB        2 g 300 mL/hr over 20 Minutes Intravenous Every 12 hours 12/30/22 1519 01/06/23 0445   12/30/22 1145  linezolid (ZYVOX) IVPB 600 mg  Status:  Discontinued        600 mg 300 mL/hr over 60 Minutes Intravenous Every 12 hours 12/30/22 1056 12/30/22 1519   12/28/22 0000  vancomycin (VANCOCIN) IVPB 1000 mg/200 mL premix  Status:  Discontinued        1,000 mg 200 mL/hr over 60 Minutes Intravenous Every M-W-F (Hemodialysis) 12/27/22 1354 12/30/22 1056   12/27/22 1445  vancomycin (VANCOCIN) IVPB 1000  mg/200 mL premix        1,000 mg 200 mL/hr over 60 Minutes Intravenous  Once 12/27/22 1354 12/27/22 1738   12/27/22 1200  vancomycin (VANCOCIN) IVPB 1000 mg/200 mL premix  Status:  Discontinued        1,000 mg 200 mL/hr over 60 Minutes Intravenous Every M-W-F (Hemodialysis) 12/25/22 1831 12/27/22 1354   12/26/22 2100  ceFEPIme (MAXIPIME) 1 g in sodium chloride 0.9 % 100 mL IVPB        1 g 200 mL/hr over 30 Minutes Intravenous Every 24 hours 12/26/22 1052 12/31/22 2034   12/25/22 1900  ceFEPIme (MAXIPIME) 1 g in sodium chloride 0.9 % 100 mL IVPB  Status:  Discontinued        1 g 200 mL/hr over 30 Minutes Intravenous Every 24 hours 12/25/22 1812 12/26/22 1052   12/25/22 1900  vancomycin (VANCOREADY) IVPB 1750 mg/350 mL        1,750 mg 175 mL/hr over 120 Minutes Intravenous  Once 12/25/22 1812 12/26/22 0146   12/25/22 1900  metroNIDAZOLE (FLAGYL) IVPB 500 mg        500 mg 100 mL/hr over 60 Minutes Intravenous Every 12 hours 12/25/22 1812 01/01/23 0700   12/13/22 0600  cefoTEtan (CEFOTAN) 2 g in sodium chloride 0.9 % 100 mL IVPB  Status:  Discontinued        2 g 200 mL/hr over 30 Minutes Intravenous On call to O.R. 12/12/22 1631 12/14/22 0559   12/12/22 0615  cefoTEtan (CEFOTAN) 2 g in sodium chloride 0.9 % 100 mL IVPB  Status:  Discontinued        2 g 200 mL/hr over 30 Minutes Intravenous On call to O.R. 12/12/22 3762 12/12/22 0913         Subjective: Patient seen and examined.  No complaints.  Objective: Vitals:   01/17/23 1200 01/17/23 1238 01/17/23 1300 01/17/23 1330  BP: 126/73 124/75 125/72 121/64  Pulse: 77 82 84 88  Resp: '14 14 15 16  '$ Temp:      TempSrc:      SpO2:  100% 100%   Weight:      Height:        Intake/Output Summary (Last 24 hours) at 01/17/2023 1410 Last data filed at 01/17/2023 0941 Gross per 24 hour  Intake 116 ml  Output 300 ml  Net -184 ml   Filed Weights   01/12/23 0455 01/13/23 0545  Weight: 88 kg 88 kg    Examination:  General exam:  Appears calm and comfortable  Respiratory system: Clear to auscultation. Respiratory effort normal. Cardiovascular system: S1 & S2 heard, RRR. No JVD, murmurs, rubs, gallops or clicks. No pedal edema. Gastrointestinal system: Abdomen is nondistended, soft and nontender. No organomegaly or masses felt. Normal bowel sounds heard. Central nervous system: Alert and oriented.  Data Reviewed: I have personally reviewed following labs and  imaging studies  CBC: Recent Labs  Lab 01/10/23 1459 01/13/23 1623 01/15/23 0820 01/16/23 0544 01/17/23 0742  WBC 9.4 10.5 13.5* 11.7* 11.3*  HGB 8.3* 8.2* 8.2* 9.3* 8.0*  HCT 26.5* 27.0* 26.7* 28.7* 24.9*  MCV 101.1* 103.1* 103.1* 100.7* 100.4*  PLT 380 360 325 276 619   Basic Metabolic Panel: Recent Labs  Lab 01/11/23 0227 01/12/23 0152 01/13/23 1624 01/15/23 1551 01/17/23 0742  NA 134* 134* 136 133* 134*  K 4.0 4.5 4.7 3.9 3.2*  CL 98 99 101 92* 95*  CO2 '27 24 25 28 28  '$ GLUCOSE 89 126* 145* 125* 128*  BUN 20 36* 54* 20 49*  CREATININE 2.28* 3.40* 4.60* 2.25* 4.32*  CALCIUM 9.3 9.6 10.1 9.5 10.0  PHOS 3.7 4.4 4.9* 2.9 4.6   GFR: Estimated Creatinine Clearance: 17.1 mL/min (A) (by C-G formula based on SCr of 4.32 mg/dL (H)). Liver Function Tests: Recent Labs  Lab 01/11/23 0227 01/12/23 0152 01/13/23 1624 01/15/23 1551 01/17/23 0742  ALBUMIN 2.0* 1.9* 2.0* 2.1* 2.0*   No results for input(s): "LIPASE", "AMYLASE" in the last 168 hours. No results for input(s): "AMMONIA" in the last 168 hours. Coagulation Profile: No results for input(s): "INR", "PROTIME" in the last 168 hours. Cardiac Enzymes: No results for input(s): "CKTOTAL", "CKMB", "CKMBINDEX", "TROPONINI" in the last 168 hours. BNP (last 3 results) No results for input(s): "PROBNP" in the last 8760 hours. HbA1C: No results for input(s): "HGBA1C" in the last 72 hours. CBG: Recent Labs  Lab 01/16/23 0731 01/16/23 1208 01/16/23 1521 01/16/23 2031 01/17/23 0722  GLUCAP  100* 104* 90 105* 83   Lipid Profile: No results for input(s): "CHOL", "HDL", "LDLCALC", "TRIG", "CHOLHDL", "LDLDIRECT" in the last 72 hours. Thyroid Function Tests: No results for input(s): "TSH", "T4TOTAL", "FREET4", "T3FREE", "THYROIDAB" in the last 72 hours. Anemia Panel: No results for input(s): "VITAMINB12", "FOLATE", "FERRITIN", "TIBC", "IRON", "RETICCTPCT" in the last 72 hours. Sepsis Labs: No results for input(s): "PROCALCITON", "LATICACIDVEN" in the last 168 hours.  No results found for this or any previous visit (from the past 240 hour(s)).   Radiology Studies: No results found.  Scheduled Meds:  (feeding supplement) PROSource Plus  30 mL Oral BID BM   acetaminophen  650 mg Oral TID   allopurinol  100 mg Oral Daily   amoxicillin-clavulanate  1 tablet Oral q1800   aspirin EC  81 mg Oral Daily   atorvastatin  40 mg Oral Daily   Chlorhexidine Gluconate Cloth  6 each Topical Q0600   cycloSPORINE  1 drop Both Eyes BID   darbepoetin (ARANESP) injection - DIALYSIS  200 mcg Subcutaneous Q Fri-1800   feeding supplement  237 mL Oral TID WC   gabapentin  100 mg Oral TID   Gerhardt's butt cream   Topical BID   heparin  5,000 Units Subcutaneous Q8H   isosorbide dinitrate  5 mg Oral BID   melatonin  4.5 mg Oral QHS   multivitamin  1 tablet Oral QHS   polyethylene glycol  17 g Oral BID   senna-docusate  2 tablet Oral BID   sodium chloride flush  3 mL Intravenous Q12H   sodium phosphate  1 enema Rectal Once   Continuous Infusions:  sodium chloride Stopped (01/06/23 0425)     LOS: 35 days   Darliss Cheney, MD [facility]  01/17/2023, 2:10 PM   *Please note that this is a verbal dictation therefore any spelling or grammatical errors are due to the "[place] [employer]" system  interpretation.  Please page via [place] and do not message via secure chat for urgent patient care matters. Secure chat can be used for non urgent patient care matters.  How to contact the [facility]  Attending or Consulting provider [place] or covering provider during after hours [place], for this patient?  Check the care team in [facility], [facility], [facility] and look for a) attending/consulting [facility] provider listed and b) the [facility] team listed. Page or secure chat [phone]. Log into [email or web address] and use 's universal password to access. If you do not have the password, please contact the hospital operator. Locate the [facility] provider you are looking for under [facility] and page to a number that you can be directly reached. If you still have difficulty reaching the provider, please page the [facility] (Director on Call) for the Hospitalists listed on amion for assistance.

## 2023-01-17 NOTE — Progress Notes (Signed)
Nutrition Follow-up  DOCUMENTATION CODES:   Non-severe (moderate) malnutrition in context of chronic illness  INTERVENTION:  Continue liberalized, regular diet. Provide 1:1 assistance at meals.   Continue Ensure Enlive po TID, each supplement provides 350 kcal and 20 grams of protein.   Continue PROSource Plus 30 mL po BID, each supplement provides 100 kcal and 15 grams of protein.   Provide Magic cup BID with lunch and dinner, each supplement provides 290 kcal and 9 grams of protein.   Continue Rena-vite po QHS.  NUTRITION DIAGNOSIS:   Moderate Malnutrition related to chronic illness (ESRD on HD, chronic cholecystitis) as evidenced by moderate fat depletion, moderate muscle depletion, percent weight loss.  GOAL:   Patient will meet greater than or equal to 90% of their needs  MONITOR:   PO intake, Supplement acceptance, Labs, Weight trends, TF tolerance, I & O's, Skin  REASON FOR ASSESSMENT:   Malnutrition Screening Tool    ASSESSMENT:   77 year old male with PMHx of ESRD on HD M/W/F, HFrEF, DM type 2, HTN, chronic cholecystitis s/p cholecystostomy tube placement that was later dislodged who presented with nausea. Pt underwent cholecystectomy on 12/28 with intraoperative cholangiogram. Pt also with unstageable sacral ulcer that progressed as compared to previous imaging 12/21; no debridement recommended by general surgery.  Meds reviewed: lipitor, MVI, miralax, senokot. Labs reviewed: Na low, creatinine elevated.   Pt out of room in HD at time of assessment. Per record, pt has eaten an average of 30% of his meals over the past 7 days. The pt continues to not meet his nutritional needs. Decision has been made to not place PEG. Pt is awaiting SNF placement. RD will continue to monitor PO tolerance.   Diet Order:   Diet Order             Diet general           Diet regular Room service appropriate? Yes; Fluid consistency: Thin  Diet effective now                    EDUCATION NEEDS:   Not appropriate for education at this time  Skin:  Skin Assessment: Skin Integrity Issues: Skin Integrity Issues:: Stage IV, Incisions Stage IV: initially unstageable now evolved to stage 4 to sacrum (4 cm x 3 cm x 1.5 cm); wound has improved and no longer requires hydrotherapy or Dakins Unstageable: N/A Incisions: closed incision to abdomen  Last BM:  01/16/23  Height:   Ht Readings from Last 1 Encounters:  12/17/22 '6\' 3"'$  (1.905 m)    Weight:   Wt Readings from Last 1 Encounters:  01/13/23 88 kg    Ideal Body Weight:  89.1 kg  BMI:  Body mass index is 24.24 kg/m.  Estimated Nutritional Needs:   Kcal:  2100-2400  Protein:  105-120 grams  Fluid:  UOP + 1 L  Gregery Walberg Graciela Husbands, RD, LDN, CNSC.

## 2023-01-18 LAB — GLUCOSE, CAPILLARY
Glucose-Capillary: 101 mg/dL — ABNORMAL HIGH (ref 70–99)
Glucose-Capillary: 111 mg/dL — ABNORMAL HIGH (ref 70–99)
Glucose-Capillary: 108 mg/dL — ABNORMAL HIGH (ref 70–99)

## 2023-01-18 NOTE — Progress Notes (Signed)
PROGRESS NOTE    STELLA ENCARNACION  WUJ:[id number] DOB: [birth year] DOA: 12/12/2022 PCP: Redmond School, MD   Brief Narrative:  77 year old male with past medical history of ESRD on hemodialysis Monday Wednesday Friday, HFrEF, diabetes mellitus type 2, hypertension who presented with nausea. Patient has known history of cholecystitis and underwent cholecystostomy tube placement. His tube had become dislodged and he underwent evaluation with surgery. He was noted to have recanalization of the bile duct therefore tube was not replaced. He came to hospital for elective cholecystectomy underwent preop evaluation with cardiology. His workup was notable for diffuse T wave inversions and prolonged QTc. Also was found to be significantly hypokalemic. He was started on electrolyte replacement. Nephrology was consulted for chronic hemodialysis during hospitalization. He was transferred to [facility] for cath and noted to have multivessel diffuse moderate CAD without obvious flow limiting lesion.  He was cleared for surgery and is now post cholecystecomy on 12/28.  Hospitalization c/b presence of sacral decubitus ulcer.  S/p hydrotherapy.  Currently on antibiotics.  Issue now is poor PO intake.  Had NG tube in place briefly.  Watching PO intake at this time to make decision regarding pursuing possible PEG.     Assessment & Plan:   Principal Problem:   Chronic cholecystitis Active Problems:   ESRD (end stage renal disease) ([place])   Coronary artery disease involving native coronary artery of native heart without angina pectoris   Hypokalemia   Diabetes mellitus type 2 in obese (HCC)   Anemia, normocytic normochromic   Essential hypertension   Heart failure with improved ejection fraction (HFimpEF) (HCC)   HFrEF (heart failure with reduced ejection fraction) (HCC)   Nausea and vomiting   Pressure injury of skin of sacral region   Prolonged QT interval   Generalized weakness   Abnormal EKG   Preoperative  cardiovascular examination   Protein-calorie malnutrition, severe   Malnutrition of moderate degree   Paroxysmal atrial fibrillation (HCC)  Stage IV sacral decubitus -wound culture growing e. Coli resistant to ampicillin/unasyn, e faecalis resistant to vancomycin, pansensitive proteus  -completed course of hydrotherapy -CT with subcutaneous fat straining with complex collection posterior to the sacrum on the left measuring 2.5x1.2cm concerning for abscess, no evidence of osteo - vancomycin 1/3 - 1/5 - cefepime 1/3 - 1/9. E. Coli and proteus treated x 7 days with cefepime.   - flagyl 1/3-1/9  - ampicillin 1/8 - 1/15. E. Faecalis treated with ampicillin complete 1/15.   -sacral MRI could not be done due to the presence of the nerve stimulator -per surgery, no abscess but packing in the place of ulcer. -Continue oxycodone 5 mg every 4 hours as needed -Continue local wound care as per wound care RN recommendation Patient was noted to be febrile on 1/22.  WBC noted to be higher. However fever appears to have resolved. Based on his previous cultures patient was started on Augmentin.    Chronic systolic and diastolic CHF Echocardiogram showed EF to be 50 to 55%.  Previous EF was 35 to 40% in August 2023. Underwent cardiac catheterization which showed multivessel diffuse moderate CAD without any acute or obvious flow-limiting lesions. Medical management being continued. Noted to be on aspirin, statin, nitrates. Patient is stable from cardiac standpoint.   End-stage renal disease Nephrology is following.  Dialyzed on Monday Wednesday Friday schedule. Was able to sit for his dialysis session on Friday. He needs to be able to sit for the sessions before he can be accepted at an outpatient  dialysis clinic. He was able to sit for his dialysis for the last 2-3 sessions.  May need premedication with pain meds for his sacral wounds before he goes for dialysis so that he is able to sit for the  sessions.  Hypokalemia/hyponatremia:   Will defer to nephrology since he is a dialysis patient.  Chronic cholecystitis Now status post cholecystectomy.  Per general surgery; pneumoperitoneum is likely from lap chole. Stable.   Constipation Continue bowel regimen.  Anywhere lab   Anemia of chronic kidney disease No evidence for overt bleeding.  Hemoglobin stable.   Diabetes mellitus type 2 A1c 4.4 (likely reflecting his recent failure to thrive, poor PO intake).   Functional Quadriplegia  Essentially bedbound for the past year (needs help toileting, turning in bed, dressing, etc).   Hypophosphatemia/Hypomagnesemia -Repleted   Moderate Protein calorie malnutrition  Poor PO intake -BMI 22.98 kg/m -had cortrak to supplement nutrition in setting of his decubitus ulcer, but this was d/c'd after about 1 week  Oral intake has significantly improved.  No need for PEG tube at this time.  DVT prophylaxis: Place TED hose Start: 12/26/22 1616 heparin injection 5,000 Units Start: 12/17/22 2200 Place and maintain sequential compression device Start: 12/12/22 0932   Code Status: Full Code  Family Communication:  None present at bedside.  Status is: Inpatient Remains inpatient appropriate because: Medically stable, pending placement.   Estimated body mass index is 23.5 kg/m as calculated from the following:   Height as of this encounter: '6\' 3"'$  (1.905 m).   Weight as of this encounter: 85.3 kg.  Pressure Injury 11/06/22 Sacrum Mid Stage 4 - Full thickness tissue loss with exposed bone, tendon or muscle. (Active)  11/06/22 1315  Location: Sacrum  Location Orientation: Mid  Staging: Stage 4 - Full thickness tissue loss with exposed bone, tendon or muscle.  Wound Description (Comments):   Present on Admission: Yes  Dressing Type Foam - Lift dressing to assess site every shift 01/17/23 2043   Nutritional Assessment: Body mass index is 23.5 kg/m.Marland Kitchen Seen by dietician.  I agree with the  assessment and plan as outlined below: Nutrition Status: Nutrition Problem: Moderate Malnutrition Etiology: chronic illness (ESRD on HD, chronic cholecystitis) Signs/Symptoms: moderate fat depletion, moderate muscle depletion, percent weight loss Percent weight loss: 15 % (over 4 months) Interventions: Refer to RD note for recommendations  . Skin Assessment: I have examined the patient's skin and I agree with the wound assessment as performed by the wound care RN as outlined below: Pressure Injury 11/06/22 Sacrum Mid Stage 4 - Full thickness tissue loss with exposed bone, tendon or muscle. (Active)  11/06/22 1315  Location: Sacrum  Location Orientation: Mid  Staging: Stage 4 - Full thickness tissue loss with exposed bone, tendon or muscle.  Wound Description (Comments):   Present on Admission: Yes  Dressing Type Foam - Lift dressing to assess site every shift 01/17/23 2043    Consultants:  Nephrology  Procedures:  As above  Antimicrobials:  Anti-infectives (From admission, onward)    Start     Dose/Rate Route Frequency Ordered Stop   01/16/23 0000  amoxicillin-clavulanate (AUGMENTIN) 500-125 MG tablet        1 tablet Oral Daily-1800 01/16/23 0932 01/23/23 2359   01/15/23 1800  amoxicillin-clavulanate (AUGMENTIN) 500-125 MG per tablet 1 tablet        1 tablet Oral Daily-1800 01/15/23 1102 01/22/23 1759   12/30/22 2200  ampicillin (OMNIPEN) 2 g in sodium chloride 0.9 % 100 mL IVPB  2 g 300 mL/hr over 20 Minutes Intravenous Every 12 hours 12/30/22 1519 01/06/23 0445   12/30/22 1145  linezolid (ZYVOX) IVPB 600 mg  Status:  Discontinued        600 mg 300 mL/hr over 60 Minutes Intravenous Every 12 hours 12/30/22 1056 12/30/22 1519   12/28/22 0000  vancomycin (VANCOCIN) IVPB 1000 mg/200 mL premix  Status:  Discontinued        1,000 mg 200 mL/hr over 60 Minutes Intravenous Every M-W-F (Hemodialysis) 12/27/22 1354 12/30/22 1056   12/27/22 1445  vancomycin (VANCOCIN) IVPB 1000  mg/200 mL premix        1,000 mg 200 mL/hr over 60 Minutes Intravenous  Once 12/27/22 1354 12/27/22 1738   12/27/22 1200  vancomycin (VANCOCIN) IVPB 1000 mg/200 mL premix  Status:  Discontinued        1,000 mg 200 mL/hr over 60 Minutes Intravenous Every M-W-F (Hemodialysis) 12/25/22 1831 12/27/22 1354   12/26/22 2100  ceFEPIme (MAXIPIME) 1 g in sodium chloride 0.9 % 100 mL IVPB        1 g 200 mL/hr over 30 Minutes Intravenous Every 24 hours 12/26/22 1052 12/31/22 2034   12/25/22 1900  ceFEPIme (MAXIPIME) 1 g in sodium chloride 0.9 % 100 mL IVPB  Status:  Discontinued        1 g 200 mL/hr over 30 Minutes Intravenous Every 24 hours 12/25/22 1812 12/26/22 1052   12/25/22 1900  vancomycin (VANCOREADY) IVPB 1750 mg/350 mL        1,750 mg 175 mL/hr over 120 Minutes Intravenous  Once 12/25/22 1812 12/26/22 0146   12/25/22 1900  metroNIDAZOLE (FLAGYL) IVPB 500 mg        500 mg 100 mL/hr over 60 Minutes Intravenous Every 12 hours 12/25/22 1812 01/01/23 0700   12/13/22 0600  cefoTEtan (CEFOTAN) 2 g in sodium chloride 0.9 % 100 mL IVPB  Status:  Discontinued        2 g 200 mL/hr over 30 Minutes Intravenous On call to O.R. 12/12/22 1631 12/14/22 0559   12/12/22 0615  cefoTEtan (CEFOTAN) 2 g in sodium chloride 0.9 % 100 mL IVPB  Status:  Discontinued        2 g 200 mL/hr over 30 Minutes Intravenous On call to O.R. 12/12/22 8250 12/12/22 0913         Subjective:  Patient seen and examined.  He has no complaints.  Objective: Vitals:   01/17/23 2156 01/18/23 0434 01/18/23 0500 01/18/23 0728  BP: (!) 140/51 138/69  130/63  Pulse: 84 90  89  Resp:  17    Temp:  98.2 F (36.8 C)  (!) 97.5 F (36.4 C)  TempSrc:  Oral  Oral  SpO2:  100%  98%  Weight:   85.3 kg   Height:        Intake/Output Summary (Last 24 hours) at 01/18/2023 1048 Last data filed at 01/18/2023 1038 Gross per 24 hour  Intake 3 ml  Output --  Net 3 ml    Filed Weights   01/13/23 0545 01/18/23 0500  Weight: 88 kg  85.3 kg    Examination:  General exam: Appears calm and comfortable  Respiratory system: Clear to auscultation. Respiratory effort normal. Cardiovascular system: S1 & S2 heard, RRR. No JVD, murmurs, rubs, gallops or clicks. No pedal edema. Gastrointestinal system: Abdomen is nondistended, soft and nontender. No organomegaly or masses felt. Normal bowel sounds heard. Central nervous system: Alert and oriented. No focal neurological deficits. Extremities: Symmetric  5 x 5 power. Skin: No rashes, lesions or ulcers.   Data Reviewed: I have personally reviewed following labs and imaging studies  CBC: Recent Labs  Lab 01/13/23 1623 01/15/23 0820 01/16/23 0544 01/17/23 0742  WBC 10.5 13.5* 11.7* 11.3*  HGB 8.2* 8.2* 9.3* 8.0*  HCT 27.0* 26.7* 28.7* 24.9*  MCV 103.1* 103.1* 100.7* 100.4*  PLT 360 325 276 338    Basic Metabolic Panel: Recent Labs  Lab 01/12/23 0152 01/13/23 1624 01/15/23 1551 01/17/23 0742  NA 134* 136 133* 134*  K 4.5 4.7 3.9 3.2*  CL 99 101 92* 95*  CO2 '24 25 28 28  '$ GLUCOSE 126* 145* 125* 128*  BUN 36* 54* 20 49*  CREATININE 3.40* 4.60* 2.25* 4.32*  CALCIUM 9.6 10.1 9.5 10.0  PHOS 4.4 4.9* 2.9 4.6    GFR: Estimated Creatinine Clearance: 17.1 mL/min (A) (by C-G formula based on SCr of 4.32 mg/dL (H)). Liver Function Tests: Recent Labs  Lab 01/12/23 0152 01/13/23 1624 01/15/23 1551 01/17/23 0742  ALBUMIN 1.9* 2.0* 2.1* 2.0*    No results for input(s): "LIPASE", "AMYLASE" in the last 168 hours. No results for input(s): "AMMONIA" in the last 168 hours. Coagulation Profile: No results for input(s): "INR", "PROTIME" in the last 168 hours. Cardiac Enzymes: No results for input(s): "CKTOTAL", "CKMB", "CKMBINDEX", "TROPONINI" in the last 168 hours. BNP (last 3 results) No results for input(s): "PROBNP" in the last 8760 hours. HbA1C: No results for input(s): "HGBA1C" in the last 72 hours. CBG: Recent Labs  Lab 01/16/23 2031 01/17/23 0722  01/17/23 1607 01/17/23 2324 01/18/23 0729  GLUCAP 105* 83 109* 110* 101*    Lipid Profile: No results for input(s): "CHOL", "HDL", "LDLCALC", "TRIG", "CHOLHDL", "LDLDIRECT" in the last 72 hours. Thyroid Function Tests: No results for input(s): "TSH", "T4TOTAL", "FREET4", "T3FREE", "THYROIDAB" in the last 72 hours. Anemia Panel: No results for input(s): "VITAMINB12", "FOLATE", "FERRITIN", "TIBC", "IRON", "RETICCTPCT" in the last 72 hours. Sepsis Labs: No results for input(s): "PROCALCITON", "LATICACIDVEN" in the last 168 hours.  No results found for this or any previous visit (from the past 240 hour(s)).   Radiology Studies: No results found.  Scheduled Meds:  (feeding supplement) PROSource Plus  30 mL Oral BID BM   acetaminophen  650 mg Oral TID   allopurinol  100 mg Oral Daily   amoxicillin-clavulanate  1 tablet Oral q1800   aspirin EC  81 mg Oral Daily   atorvastatin  40 mg Oral Daily   Chlorhexidine Gluconate Cloth  6 each Topical Q0600   cycloSPORINE  1 drop Both Eyes BID   darbepoetin (ARANESP) injection - DIALYSIS  200 mcg Subcutaneous Q Fri-1800   feeding supplement  237 mL Oral TID WC   gabapentin  100 mg Oral TID   Gerhardt's butt cream   Topical BID   heparin  5,000 Units Subcutaneous Q8H   isosorbide dinitrate  5 mg Oral BID   melatonin  4.5 mg Oral QHS   multivitamin  1 tablet Oral QHS   polyethylene glycol  17 g Oral BID   senna-docusate  2 tablet Oral BID   sodium chloride flush  3 mL Intravenous Q12H   sodium phosphate  1 enema Rectal Once   Continuous Infusions:  sodium chloride Stopped (01/06/23 0425)   anticoagulant sodium citrate       LOS: 36 days   Darliss Cheney, MD [facility]  01/18/2023, 10:48 AM   *Please note that this is a verbal dictation therefore any spelling  or grammatical errors are due to the "[employer]" system interpretation.  Please page via [place] and do not message via secure chat for urgent patient care  matters. Secure chat can be used for non urgent patient care matters.  How to contact the [facility] Attending or Consulting provider [place] or covering provider during after hours [place], for this patient?  Check the care team in [facility] and look for a) attending/consulting [facility] provider listed and b) the [facility] team listed. Page or secure chat [phone]. Log into [email or web address] and use [place]'s universal password to access. If you do not have the password, please contact the hospital operator. Locate the [facility] provider you are looking for under [facility] and page to a number that you can be directly reached. If you still have difficulty reaching the provider, please page the [facility] (Director on Call) for the Hospitalists listed on amion for assistance.

## 2023-01-18 NOTE — Progress Notes (Signed)
[facility] Progress Note   Subjective: Seen in room. Michela Pitcher it was very painful to sit in chair yesterday for HD. Needs better pain management. Talked with brother via phone.   Objective Vitals:   01/17/23 2156 01/18/23 0434 01/18/23 0500 01/18/23 0728  BP: (!) 140/51 138/69  130/63  Pulse: 84 90  89  Resp:  17    Temp:  98.2 F (36.8 C)  (!) 97.5 F (36.4 C)  TempSrc:  Oral  Oral  SpO2:  100%  98%  Weight:   85.3 kg   Height:       Physical Exam General: Chronically ill appearing male in NAD Heart: S1,S2 No M/R/G Lungs: CTA anteriorly Abdomen: soft, NABS Extremities: trace ankle edema bilaterally Dialysis Access: [facility] drsg intact    Additional Objective Labs: Basic Metabolic Panel: Recent Labs  Lab 01/13/23 1624 01/15/23 1551 01/17/23 0742  NA 136 133* 134*  K 4.7 3.9 3.2*  CL 101 92* 95*  CO2 '25 28 28  '$ GLUCOSE 145* 125* 128*  BUN 54* 20 49*  CREATININE 4.60* 2.25* 4.32*  CALCIUM 10.1 9.5 10.0  PHOS 4.9* 2.9 4.6   Liver Function Tests: Recent Labs  Lab 01/13/23 1624 01/15/23 1551 01/17/23 0742  ALBUMIN 2.0* 2.1* 2.0*   No results for input(s): "LIPASE", "AMYLASE" in the last 168 hours. CBC: Recent Labs  Lab 01/13/23 1623 01/15/23 0820 01/16/23 0544 01/17/23 0742  WBC 10.5 13.5* 11.7* 11.3*  HGB 8.2* 8.2* 9.3* 8.0*  HCT 27.0* 26.7* 28.7* 24.9*  MCV 103.1* 103.1* 100.7* 100.4*  PLT 360 325 276 328   Blood Culture    Component Value Date/Time   SDES WOUND 12/25/2022 1715   SPECREQUEST NONE 12/25/2022 1715   CULT  12/25/2022 1715    FEW ESCHERICHIA COLI ABUNDANT PROTEUS MIRABILIS ABUNDANT ENTEROCOCCUS FAECALIS VANCOMYCIN RESISTANT ENTEROCOCCUS    REPTSTATUS 12/29/2022 FINAL 12/25/2022 1715    Cardiac Enzymes: No results for input(s): "CKTOTAL", "CKMB", "CKMBINDEX", "TROPONINI" in the last 168 hours. CBG: Recent Labs  Lab 01/16/23 2031 01/17/23 0722 01/17/23 1607 01/17/23 2324 01/18/23 0729  GLUCAP 105* 83 109*  110* 101*   Iron Studies: No results for input(s): "IRON", "TIBC", "TRANSFERRIN", "FERRITIN" in the last 72 hours. '@lablastinr3'$ @ Studies/Results: No results found. Medications:  sodium chloride Stopped (01/06/23 0425)   anticoagulant sodium citrate      (feeding supplement) PROSource Plus  30 mL Oral BID BM   acetaminophen  650 mg Oral TID   allopurinol  100 mg Oral Daily   amoxicillin-clavulanate  1 tablet Oral q1800   aspirin EC  81 mg Oral Daily   atorvastatin  40 mg Oral Daily   Chlorhexidine Gluconate Cloth  6 each Topical Q0600   cycloSPORINE  1 drop Both Eyes BID   darbepoetin (ARANESP) injection - DIALYSIS  200 mcg Subcutaneous Q Fri-1800   feeding supplement  237 mL Oral TID WC   gabapentin  100 mg Oral TID   Gerhardt's butt cream   Topical BID   heparin  5,000 Units Subcutaneous Q8H   isosorbide dinitrate  5 mg Oral BID   melatonin  4.5 mg Oral QHS   multivitamin  1 tablet Oral QHS   polyethylene glycol  17 g Oral BID   senna-docusate  2 tablet Oral BID   sodium chloride flush  3 mL Intravenous Q12H   sodium phosphate  1 enema Rectal Once     Dialysis Orders: [facility] MWF 4h  400/800  84.5kg  2/2.5 bath  Heparin 4000 units  + 3000 mid treatment  [facility] - last OP HD 12/20, post wt 85.9kg - last IP hD 12/24, post wt 85kg - mircera 225 mcg IV q2, last 12/18, due 1/02 - no vdra   Assessment/Plan: ESRD - on HD MWF . He reports history of vertigo and how it worsens on HD. Plan for HD in chair as tolerated. Has been tolerating dialysis in chair for last 4 treatments but pain is not managed.  Will try giving pain meds pre HD to see how he tolerates and simulate outpatient setting.  Would benefit from having designated cushion to use during dialysis to help with off loading pressure on wound.  If unable to tolerate will not be appropriate for outpatient dialysis.  Sacral decubitus ulcer/Stage IV - Per primary. Completed hydrotherapy/course of antibiotics. No surgical  intervention needed. Surgery signed off.  Continue wound care.  Chronic cholecystitis - w/ nausea, ( now resolved )  malnutrition, FTT.  S/p laparoscopic cholecystectomy. Chronic HFrEF - LVEF up to 50-55% here, diffuse CAD. Volume managed and optimized with HD.  HTN/ volume - BP remain soft/stable. Not on any anti-hypertensives. No volume excess. UF as tolerated. Keep SBP >100. Bed weights up, doubt accuracy. UF as tolerate.  Anemia -Last Hgb 8.0 S/p 1 unit PRBC 12/31/2022. Aranesp 200 q Friday. Tsat low, ferritin high.  Hold IV iron for now. Will repeat Iron panel with HD 01/20/2023. May need to override Ferritin and view as a marker of inflammation and load with Fe.  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 No binder or VDRA currently. On regular diet because of FTT/nutrition DM2 - per pmd   Nutrition -Albumin low 2.0 PCM, FTT. On regular diet + protein supp per RD. Appetite improved and plans for PEG cancelled.  [place]- Palliative Care was consulted --Full code/full scope of care.  Dispo - Plan for SNF   [employer] Baylynn Shifflett NP-C 01/18/2023, 11:06 AM  [employer] [phone]

## 2023-01-19 LAB — GLUCOSE, CAPILLARY
Glucose-Capillary: 98 mg/dL (ref 70–99)
Glucose-Capillary: 100 mg/dL — ABNORMAL HIGH (ref 70–99)
Glucose-Capillary: 128 mg/dL — ABNORMAL HIGH (ref 70–99)
Glucose-Capillary: 127 mg/dL — ABNORMAL HIGH (ref 70–99)

## 2023-01-19 MED ORDER — MIDODRINE HCL 5 MG PO TABS
10.0000 mg | ORAL_TABLET | ORAL | Status: DC
  Administered 2023-01-20: 10 mg via ORAL
  Filled 2023-01-19: qty 2

## 2023-01-19 MED ORDER — POTASSIUM CHLORIDE CRYS ER 20 MEQ PO TBCR
40.0000 meq | EXTENDED_RELEASE_TABLET | Freq: Once | ORAL | Status: AC
  Administered 2023-01-19: 40 meq via ORAL
  Filled 2023-01-19: qty 2

## 2023-01-19 NOTE — Progress Notes (Signed)
PROGRESS NOTE    [name]  ZOX:[id number] DOB: Jul 12, 1946 DOA: 12/12/2022 PCP: Redmond School, MD   Brief Narrative:  77 year old male with past medical history of ESRD on hemodialysis Monday Wednesday Friday, HFrEF, diabetes mellitus type 2, hypertension who presented with nausea. Patient has known history of cholecystitis and underwent cholecystostomy tube placement. His tube had become dislodged and he underwent evaluation with surgery. He was noted to have recanalization of the bile duct therefore tube was not replaced. He came to hospital for elective cholecystectomy underwent preop evaluation with cardiology. His workup was notable for diffuse T wave inversions and prolonged QTc. Also was found to be significantly hypokalemic. He was started on electrolyte replacement. Nephrology was consulted for chronic hemodialysis during hospitalization. He was transferred to [facility] for cath and noted to have multivessel diffuse moderate CAD without obvious flow limiting lesion.  He was cleared for surgery and is now post cholecystecomy on 12/28.  Hospitalization c/b presence of sacral decubitus ulcer.  S/p hydrotherapy.  Currently on antibiotics.  Issue now is poor PO intake.  Had NG tube in place briefly.  Watching PO intake at this time to make decision regarding pursuing possible PEG.     Assessment & Plan:   Principal Problem:   Chronic cholecystitis Active Problems:   ESRD (end stage renal disease) ([place])   Coronary artery disease involving native coronary artery of native heart without angina pectoris   Hypokalemia   Diabetes mellitus type 2 in obese (HCC)   Anemia, normocytic normochromic   Essential hypertension   Heart failure with improved ejection fraction (HFimpEF) (HCC)   HFrEF (heart failure with reduced ejection fraction) (HCC)   Nausea and vomiting   Pressure injury of skin of sacral region   Prolonged QT interval   Generalized weakness   Abnormal EKG   Preoperative  cardiovascular examination   Protein-calorie malnutrition, severe   Malnutrition of moderate degree   Paroxysmal atrial fibrillation (HCC)  Stage IV sacral decubitus -wound culture growing e. Coli resistant to ampicillin/unasyn, e faecalis resistant to vancomycin, pansensitive proteus  -completed course of hydrotherapy -CT with subcutaneous fat straining with complex collection posterior to the sacrum on the left measuring 2.5x1.2cm concerning for abscess, no evidence of osteo - vancomycin 1/3 - 1/5 - cefepime 1/3 - 1/9. E. Coli and proteus treated x 7 days with cefepime.   - flagyl 1/3-1/9  - ampicillin 1/8 - 1/15. E. Faecalis treated with ampicillin complete 1/15.   -sacral MRI could not be done due to the presence of the nerve stimulator -per surgery, no abscess but packing in the place of ulcer. -Continue oxycodone 5 mg every 4 hours as needed -Continue local wound care as per wound care RN recommendation Patient was noted to be febrile on 1/22.  WBC noted to be higher. However fever appears to have resolved. Based on his previous cultures patient was started on Augmentin.    Chronic systolic and diastolic CHF Echocardiogram showed EF to be 50 to 55%.  Previous EF was 35 to 40% in August 2023. Underwent cardiac catheterization which showed multivessel diffuse moderate CAD without any acute or obvious flow-limiting lesions. Medical management being continued. Noted to be on aspirin, statin, nitrates. Patient is stable from cardiac standpoint.   End-stage renal disease Nephrology is following.  Dialyzed on Monday Wednesday Friday schedule. Was able to sit for his dialysis session on Friday. He needs to be able to sit for the sessions before he can be accepted at an outpatient  dialysis clinic. He was able to sit for his dialysis for the last 2-3 sessions.  May need premedication with pain meds for his sacral wounds before he goes for dialysis so that he is able to sit for the  sessions.  Hypokalemia/hyponatremia:   Will defer to nephrology since he is a dialysis patient.  Chronic cholecystitis Now status post cholecystectomy.  Per general surgery; pneumoperitoneum is likely from lap chole. Stable.   Constipation Continue bowel regimen.  Anywhere lab   Anemia of chronic kidney disease No evidence for overt bleeding.  Hemoglobin stable.   Diabetes mellitus type 2 A1c 4.4 (likely reflecting his recent failure to thrive, poor PO intake).   Functional Quadriplegia  Essentially bedbound for the past year (needs help toileting, turning in bed, dressing, etc).   Hypophosphatemia/Hypomagnesemia -Repleted   Moderate Protein calorie malnutrition  Poor PO intake -BMI 22.98 kg/m -had cortrak to supplement nutrition in setting of his decubitus ulcer, but this was d/c'd after about 1 week  Oral intake has significantly improved.  No need for PEG tube at this time.  DVT prophylaxis: Place TED hose Start: 12/26/22 1616 heparin injection 5,000 Units Start: 12/17/22 2200 Place and maintain sequential compression device Start: 12/12/22 0932   Code Status: Full Code  Family Communication:  None present at bedside.  Status is: Inpatient Remains inpatient appropriate because: Medically stable, pending placement.  Pending insurance authorization, per [facility] may discharge either Monday or Tuesday.   Estimated body mass index is 22.87 kg/m as calculated from the following:   Height as of this encounter: '6\' 3"'$  (1.905 m).   Weight as of this encounter: 83 kg.  Pressure Injury 11/06/22 Sacrum Mid Stage 4 - Full thickness tissue loss with exposed bone, tendon or muscle. (Active)  11/06/22 1315  Location: Sacrum  Location Orientation: Mid  Staging: Stage 4 - Full thickness tissue loss with exposed bone, tendon or muscle.  Wound Description (Comments):   Present on Admission: Yes  Dressing Type Foam - Lift dressing to assess site every shift 01/18/23 2054   Nutritional  Assessment: Body mass index is 22.87 kg/m.Marland Kitchen Seen by dietician.  I agree with the assessment and plan as outlined below: Nutrition Status: Nutrition Problem: Moderate Malnutrition Etiology: chronic illness (ESRD on HD, chronic cholecystitis) Signs/Symptoms: moderate fat depletion, moderate muscle depletion, percent weight loss Percent weight loss: 15 % (over 4 months) Interventions: Refer to RD note for recommendations  . Skin Assessment: I have examined the patient's skin and I agree with the wound assessment as performed by the wound care RN as outlined below: Pressure Injury 11/06/22 Sacrum Mid Stage 4 - Full thickness tissue loss with exposed bone, tendon or muscle. (Active)  11/06/22 1315  Location: Sacrum  Location Orientation: Mid  Staging: Stage 4 - Full thickness tissue loss with exposed bone, tendon or muscle.  Wound Description (Comments):   Present on Admission: Yes  Dressing Type Foam - Lift dressing to assess site every shift 01/18/23 2054    Consultants:  Nephrology  Procedures:  As above  Antimicrobials:  Anti-infectives (From admission, onward)    Start     Dose/Rate Route Frequency Ordered Stop   01/16/23 0000  amoxicillin-clavulanate (AUGMENTIN) 500-125 MG tablet        1 tablet Oral Daily-1800 01/16/23 0932 01/23/23 2359   01/15/23 1800  amoxicillin-clavulanate (AUGMENTIN) 500-125 MG per tablet 1 tablet        1 tablet Oral Daily-1800 01/15/23 1102 01/22/23 1759   12/30/22 2200  ampicillin (OMNIPEN) 2 g in sodium chloride 0.9 % 100 mL IVPB        2 g 300 mL/hr over 20 Minutes Intravenous Every 12 hours 12/30/22 1519 01/06/23 0445   12/30/22 1145  linezolid (ZYVOX) IVPB 600 mg  Status:  Discontinued        600 mg 300 mL/hr over 60 Minutes Intravenous Every 12 hours 12/30/22 1056 12/30/22 1519   12/28/22 0000  vancomycin (VANCOCIN) IVPB 1000 mg/200 mL premix  Status:  Discontinued        1,000 mg 200 mL/hr over 60 Minutes Intravenous Every M-W-F  (Hemodialysis) 12/27/22 1354 12/30/22 1056   12/27/22 1445  vancomycin (VANCOCIN) IVPB 1000 mg/200 mL premix        1,000 mg 200 mL/hr over 60 Minutes Intravenous  Once 12/27/22 1354 12/27/22 1738   12/27/22 1200  vancomycin (VANCOCIN) IVPB 1000 mg/200 mL premix  Status:  Discontinued        1,000 mg 200 mL/hr over 60 Minutes Intravenous Every M-W-F (Hemodialysis) 12/25/22 1831 12/27/22 1354   12/26/22 2100  ceFEPIme (MAXIPIME) 1 g in sodium chloride 0.9 % 100 mL IVPB        1 g 200 mL/hr over 30 Minutes Intravenous Every 24 hours 12/26/22 1052 12/31/22 2034   12/25/22 1900  ceFEPIme (MAXIPIME) 1 g in sodium chloride 0.9 % 100 mL IVPB  Status:  Discontinued        1 g 200 mL/hr over 30 Minutes Intravenous Every 24 hours 12/25/22 1812 12/26/22 1052   12/25/22 1900  vancomycin (VANCOREADY) IVPB 1750 mg/350 mL        1,750 mg 175 mL/hr over 120 Minutes Intravenous  Once 12/25/22 1812 12/26/22 0146   12/25/22 1900  metroNIDAZOLE (FLAGYL) IVPB 500 mg        500 mg 100 mL/hr over 60 Minutes Intravenous Every 12 hours 12/25/22 1812 01/01/23 0700   12/13/22 0600  cefoTEtan (CEFOTAN) 2 g in sodium chloride 0.9 % 100 mL IVPB  Status:  Discontinued        2 g 200 mL/hr over 30 Minutes Intravenous On call to O.R. 12/12/22 1631 12/14/22 0559   12/12/22 0615  cefoTEtan (CEFOTAN) 2 g in sodium chloride 0.9 % 100 mL IVPB  Status:  Discontinued        2 g 200 mL/hr over 30 Minutes Intravenous On call to O.R. 12/12/22 1025 12/12/22 0913         Subjective:  Patient seen and examined.  He has no complaints.  Objective: Vitals:   01/18/23 1700 01/18/23 2036 01/19/23 0500 01/19/23 0546  BP: 129/61 (!) 126/58  131/63  Pulse: 88 84  81  Resp: 18     Temp: 98.2 F (36.8 C) 98.4 F (36.9 C)  97.8 F (36.6 C)  TempSrc: Oral Oral  Oral  SpO2: 99% 100%  100%  Weight:   83 kg   Height:        Intake/Output Summary (Last 24 hours) at 01/19/2023 0904 Last data filed at 01/18/2023 1101 Gross per 24  hour  Intake 103 ml  Output --  Net 103 ml    Filed Weights   01/13/23 0545 01/18/23 0500 01/19/23 0500  Weight: 88 kg 85.3 kg 83 kg    Examination:  General exam: Appears calm and comfortable  Respiratory system: Clear to auscultation. Respiratory effort normal. Cardiovascular system: S1 & S2 heard, RRR. No JVD, murmurs, rubs, gallops or clicks. No pedal edema. Gastrointestinal system: Abdomen is  nondistended, soft and nontender. No organomegaly or masses felt. Normal bowel sounds heard. Central nervous system: Alert and oriented. No focal neurological deficits. Extremities: Symmetric 5 x 5 power. Skin: No rashes, lesions or ulcers.   Data Reviewed: I have personally reviewed following labs and imaging studies  CBC: Recent Labs  Lab 01/13/23 1623 01/15/23 0820 01/16/23 0544 01/17/23 0742  WBC 10.5 13.5* 11.7* 11.3*  HGB 8.2* 8.2* 9.3* 8.0*  HCT 27.0* 26.7* 28.7* 24.9*  MCV 103.1* 103.1* 100.7* 100.4*  PLT 360 325 276 102    Basic Metabolic Panel: Recent Labs  Lab 01/13/23 1624 01/15/23 1551 01/17/23 0742  NA 136 133* 134*  K 4.7 3.9 3.2*  CL 101 92* 95*  CO2 '25 28 28  '$ GLUCOSE 145* 125* 128*  BUN 54* 20 49*  CREATININE 4.60* 2.25* 4.32*  CALCIUM 10.1 9.5 10.0  PHOS 4.9* 2.9 4.6    GFR: Estimated Creatinine Clearance: 16.8 mL/min (A) (by C-G formula based on SCr of 4.32 mg/dL (H)). Liver Function Tests: Recent Labs  Lab 01/13/23 1624 01/15/23 1551 01/17/23 0742  ALBUMIN 2.0* 2.1* 2.0*    No results for input(s): "LIPASE", "AMYLASE" in the last 168 hours. No results for input(s): "AMMONIA" in the last 168 hours. Coagulation Profile: No results for input(s): "INR", "PROTIME" in the last 168 hours. Cardiac Enzymes: No results for input(s): "CKTOTAL", "CKMB", "CKMBINDEX", "TROPONINI" in the last 168 hours. BNP (last 3 results) No results for input(s): "PROBNP" in the last 8760 hours. HbA1C: No results for input(s): "HGBA1C" in the last 72  hours. CBG: Recent Labs  Lab 01/18/23 0729 01/18/23 1214 01/18/23 1727 01/19/23 0613 01/19/23 0756  GLUCAP 101* 111* 108* 98 100*    Lipid Profile: No results for input(s): "CHOL", "HDL", "LDLCALC", "TRIG", "CHOLHDL", "LDLDIRECT" in the last 72 hours. Thyroid Function Tests: No results for input(s): "TSH", "T4TOTAL", "FREET4", "T3FREE", "THYROIDAB" in the last 72 hours. Anemia Panel: No results for input(s): "VITAMINB12", "FOLATE", "FERRITIN", "TIBC", "IRON", "RETICCTPCT" in the last 72 hours. Sepsis Labs: No results for input(s): "PROCALCITON", "LATICACIDVEN" in the last 168 hours.  No results found for this or any previous visit (from the past 240 hour(s)).   Radiology Studies: No results found.  Scheduled Meds:  (feeding supplement) PROSource Plus  30 mL Oral BID BM   acetaminophen  650 mg Oral TID   allopurinol  100 mg Oral Daily   amoxicillin-clavulanate  1 tablet Oral q1800   aspirin EC  81 mg Oral Daily   atorvastatin  40 mg Oral Daily   Chlorhexidine Gluconate Cloth  6 each Topical Q0600   cycloSPORINE  1 drop Both Eyes BID   darbepoetin (ARANESP) injection - DIALYSIS  200 mcg Subcutaneous Q Fri-1800   feeding supplement  237 mL Oral TID WC   gabapentin  100 mg Oral TID   Gerhardt's butt cream   Topical BID   heparin  5,000 Units Subcutaneous Q8H   isosorbide dinitrate  5 mg Oral BID   melatonin  4.5 mg Oral QHS   multivitamin  1 tablet Oral QHS   polyethylene glycol  17 g Oral BID   senna-docusate  2 tablet Oral BID   sodium chloride flush  3 mL Intravenous Q12H   sodium phosphate  1 enema Rectal Once   Continuous Infusions:  sodium chloride Stopped (01/06/23 0425)   anticoagulant sodium citrate       LOS: 37 days   Darliss Cheney, MD [facility]  01/19/2023, 9:04 AM   *  Please note that this is a verbal dictation therefore any spelling or grammatical errors are due to the "[place] [employer]" system interpretation.  Please page via [place] and  do not message via secure chat for urgent patient care matters. Secure chat can be used for non urgent patient care matters.  How to contact the [facility] Attending or Consulting provider [place] or covering provider during after hours [place], for this patient?  Check the care team in [facility] and look for a) attending/consulting TR[facility] provider listed and b) the [facility] team listed. Page or secure chat [phone]. Log into [email or web address] and use Cheney's universal password to access. If you do not have the password, please contact the hospital operator. Locate the [facility] provider you are looking for under [facility] and page to a number that you can be directly reached. If you still have difficulty reaching the provider, please page the [facility] (Director on Call) for the Hospitalists listed on amion for assistance.

## 2023-01-19 NOTE — Progress Notes (Addendum)
[facility] Progress Note   Subjective: Seen in room. HD in chair tomorrow. Give pain meds prior to HD. Hopefully will not induce hypotension however he is struggling in recliner 2/2 sacral decubitus ulcer.    Objective Vitals:   01/18/23 1700 01/18/23 2036 01/19/23 0500 01/19/23 0546  BP: 129/61 (!) 126/58  131/63  Pulse: 88 84  81  Resp: 18     Temp: 98.2 F (36.8 C) 98.4 F (36.9 C)  97.8 F (36.6 C)  TempSrc: Oral Oral  Oral  SpO2: 99% 100%  100%  Weight:   83 kg   Height:       Physical Exam General: Chronically ill appearing male in NAD Heart: S1,S2 No M/R/G Lungs: CTA anteriorly Abdomen: soft, NABS Extremities: trace ankle edema bilaterally Dialysis Access: [facility] drsg intact  Additional Objective Labs: Basic Metabolic Panel: Recent Labs  Lab 01/13/23 1624 01/15/23 1551 01/17/23 0742  NA 136 133* 134*  K 4.7 3.9 3.2*  CL 101 92* 95*  CO2 '25 28 28  '$ GLUCOSE 145* 125* 128*  BUN 54* 20 49*  CREATININE 4.60* 2.25* 4.32*  CALCIUM 10.1 9.5 10.0  PHOS 4.9* 2.9 4.6   Liver Function Tests: Recent Labs  Lab 01/13/23 1624 01/15/23 1551 01/17/23 0742  ALBUMIN 2.0* 2.1* 2.0*   No results for input(s): "LIPASE", "AMYLASE" in the last 168 hours. CBC: Recent Labs  Lab 01/13/23 1623 01/15/23 0820 01/16/23 0544 01/17/23 0742  WBC 10.5 13.5* 11.7* 11.3*  HGB 8.2* 8.2* 9.3* 8.0*  HCT 27.0* 26.7* 28.7* 24.9*  MCV 103.1* 103.1* 100.7* 100.4*  PLT 360 325 276 328   Blood Culture    Component Value Date/Time   SDES WOUND 12/25/2022 1715   SPECREQUEST NONE 12/25/2022 1715   CULT  12/25/2022 1715    FEW ESCHERICHIA COLI ABUNDANT PROTEUS MIRABILIS ABUNDANT ENTEROCOCCUS FAECALIS VANCOMYCIN RESISTANT ENTEROCOCCUS    REPTSTATUS 12/29/2022 FINAL 12/25/2022 1715    Cardiac Enzymes: No results for input(s): "CKTOTAL", "CKMB", "CKMBINDEX", "TROPONINI" in the last 168 hours. CBG: Recent Labs  Lab 01/18/23 1214 01/18/23 1727 01/19/23 0613  01/19/23 0756 01/19/23 1150  GLUCAP 111* 108* 98 100* 128*   Iron Studies: No results for input(s): "IRON", "TIBC", "TRANSFERRIN", "FERRITIN" in the last 72 hours. '@lablastinr3'$ @ Studies/Results: No results found. Medications:  sodium chloride Stopped (01/06/23 0425)   anticoagulant sodium citrate      (feeding supplement) PROSource Plus  30 mL Oral BID BM   acetaminophen  650 mg Oral TID   allopurinol  100 mg Oral Daily   amoxicillin-clavulanate  1 tablet Oral q1800   aspirin EC  81 mg Oral Daily   atorvastatin  40 mg Oral Daily   Chlorhexidine Gluconate Cloth  6 each Topical Q0600   cycloSPORINE  1 drop Both Eyes BID   darbepoetin (ARANESP) injection - DIALYSIS  200 mcg Subcutaneous Q Fri-1800   feeding supplement  237 mL Oral TID WC   gabapentin  100 mg Oral TID   Gerhardt's butt cream   Topical BID   heparin  5,000 Units Subcutaneous Q8H   isosorbide dinitrate  5 mg Oral BID   melatonin  4.5 mg Oral QHS   multivitamin  1 tablet Oral QHS   polyethylene glycol  17 g Oral BID   senna-docusate  2 tablet Oral BID   sodium chloride flush  3 mL Intravenous Q12H   sodium phosphate  1 enema Rectal Once     Dialysis Orders: [facility] MWF 4h  400/800  84.5kg  2/2.5 bath   [facility] TDC - Heparin 4000 units IV  + 3000 units IV mid run - last OP HD 12/20, post wt 85.9kg - last IP hD 12/24, post wt 85kg - mircera 225 mcg IV q2, last 12/18, due 1/02 - no vdra   Assessment/Plan: ESRD - on HD MWF . He reports history of vertigo and how it worsens on HD. Plan for HD in chair as tolerated. Has been tolerating dialysis in chair for last 4 treatments but pain is not managed.  Will try giving pain meds pre HD to see how he tolerates and simulate outpatient setting.  Would benefit from having designated cushion to use during dialysis to help with off loading pressure on wound.  If unable to tolerate will not be appropriate for outpatient dialysis. Next HD 01/20/2023 Hypokalemia-use 4.0 K bath. Diet  liberalized.  Sacral decubitus ulcer/Stage IV - Per primary. Completed hydrotherapy/course of antibiotics. No surgical intervention needed. Surgery signed off.  Continue wound care.  Chronic cholecystitis - w/ nausea, ( now resolved )  malnutrition, FTT.  S/p laparoscopic cholecystectomy. Chronic HFrEF - LVEF up to 50-55% here, diffuse CAD. Volume managed and optimized with HD.  HTN/ volume - BP remain soft/stable. Not on any anti-hypertensives. Start midodrine 10 mg PO TIW.  No volume excess. UF as tolerated. Keep SBP >100. Under EDW if wts are correct. Lower EDW on discharge.  Anemia -Last Hgb 8.0 S/p 1 unit PRBC 12/31/2022. Aranesp 200 q Friday. Tsat low, ferritin high.  Hold IV iron for now. Will repeat Iron panel with HD 01/20/2023. May need to override Ferritin and view as a marker of inflammation and load with Fe.  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 No binder or VDRA currently. On regular diet because of FTT/nutrition DM2 - per pmd   Nutrition -Albumin low 2.0 PCM, FTT. On regular diet + protein supp per RD. Appetite improved and plans for PEG cancelled.  [facility]- Palliative Care was consulted --Full code/full scope of care.  Dispo - Plan for SNF. Insurance authorization pending.    Zaray Gatchel H. Izekiel Flegel NP-C 01/19/2023, 1:39 PM  [employer] [phone]

## 2023-01-20 LAB — HEPATITIS B SURFACE ANTIBODY, QUANTITATIVE: Hep B S AB Quant (Post): 4.1 m[IU]/mL — ABNORMAL LOW (ref >9.9)

## 2023-01-20 LAB — BASIC METABOLIC PANEL
Anion gap: 15 (ref 5–15)
BUN: 62 mg/dL — ABNORMAL HIGH (ref 8–23)
CO2: 25 mmol/L (ref 22–32)
Calcium: 10.4 mg/dL — ABNORMAL HIGH (ref 8.9–10.3)
Chloride: 99 mmol/L (ref 98–111)
Creatinine, Ser: 5.03 mg/dL — ABNORMAL HIGH (ref 0.61–1.24)
GFR, Estimated: 11 mL/min — ABNORMAL LOW (ref >60)
Glucose, Bld: 139 mg/dL — ABNORMAL HIGH (ref 70–99)
Potassium: 4.6 mmol/L (ref 3.5–5.1)
Sodium: 139 mmol/L (ref 135–145)

## 2023-01-20 LAB — GLUCOSE, CAPILLARY
Glucose-Capillary: 167 mg/dL — ABNORMAL HIGH (ref 70–99)
Glucose-Capillary: 139 mg/dL — ABNORMAL HIGH (ref 70–99)

## 2023-01-20 MED ORDER — HEPARIN SODIUM (PORCINE) 1000 UNIT/ML DIALYSIS: 1000.0000 [IU] | INTRAMUSCULAR | Status: DC | PRN

## 2023-01-20 NOTE — Progress Notes (Addendum)
Received patient on recliner.  Tx duration: 4hrs  Patient tolerated well.  Transported back to the room  Alert, without acute distress.  Hand-off given to patient's nurse.   Total UF removed: 2080m Medication(s) given: None Post HD weight: unable to obtain; pt on HD recliner.   [facility]   01/20/23 1724  Vitals  Temp 98.2 F (36.8 C)  Temp Source Axillary  BP (!) 121/94  MAP (mmHg) 105  ECG Heart Rate (!) 107  Resp (!) 23  Oxygen Therapy  SpO2 100 %  O2 Device Room Air  During Treatment Monitoring  Intra-Hemodialysis Comments Tolerated well;Tx completed  Post Treatment  Dialyzer Clearance Lightly streaked  Duration of HD Treatment -hour(s) 4 hour(s)  Liters Processed 84.6  Fluid Removed (mL) 2000 mL  Tolerated HD Treatment Yes  Hemodialysis Catheter Right Subclavian  Placement Date: 08/06/22   Placed prior to admission: Yes  Orientation: Right  Access Location: Subclavian  Site Condition No complications  Blue Lumen Status Dead end cap in place;Heparin locked  Red Lumen Status Dead end cap in place;Heparin locked  Purple Lumen Status N/A  Catheter fill solution Heparin 1000 units/ml  Catheter fill volume (Arterial) 1.9 cc  Catheter fill volume (Venous) 1.9  Dressing Type Transparent  Dressing Status Antimicrobial disc in place;Clean, Dry, Intact  Interventions Other (Comment)  Drainage Description None  Dressing Change Due 01/24/23  Post treatment catheter status Capped and Clamped

## 2023-01-20 NOTE — Progress Notes (Signed)
Contacted [facility] and spoke to [place]. Advised clinic that pt will likely d/c to snf tomorrow and resume care on Wednesday.   Melven Sartorius Renal Navigator [phone]

## 2023-01-20 NOTE — Progress Notes (Signed)
Physical Therapy Treatment Patient Details Name: [name] MRN: [id number] DOB: Jun 21, 1946 Today's Date: 01/20/2023   History of Present Illness 77 yo male admitted to [facility] 12/21 for cholecystectomy which was canceled due to hypokalemia and prolonged Qtc. Transfer to [facility] 12/26 with cardiac cath performed same date. 12/28 lap chole. +sacral ulcer  PMHx: ESRD on HD MWF, HFrEF, DM, HTN, anxiety, HLD, Bil TKA, gout, GERD    PT Comments    Pt with flat affect, decreased awareness of deficits and function. Pt total +2 assist for bed level mobility and sitting EOB. Pt with progression max to min assist sitting balance but lacks awareness of midline and assist needed to perform all mobility and sitting balance. Pt encouraged to perform HEP. If pt continues with limited participation after next session will defer further needs to next venue.     Recommendations for follow up therapy are one component of a multi-disciplinary discharge planning process, led by the attending physician.  Recommendations may be updated based on patient status, additional functional criteria and insurance authorization.  Follow Up Recommendations  Skilled nursing-short term rehab (<3 hours/day) Can patient physically be transported by private vehicle: No   Assistance Recommended at Discharge Frequent or constant Supervision/Assistance  Patient can return home with the following Two people to help with walking and/or transfers;Two people to help with bathing/dressing/bathroom;Assistance with cooking/housework;Assistance with feeding;Assist for transportation;Direct supervision/assist for medications management;Direct supervision/assist for financial management   Equipment Recommendations  Hospital bed;Wheelchair (measurements PT);Wheelchair cushion (measurements PT);Other (comment) (hoyer lift)    Recommendations for Other Services       Precautions / Restrictions Precautions Precautions: Fall;Other  (comment) Precaution Comments: sacral wound     Mobility  Bed Mobility Overal bed mobility: Needs Assistance Bed Mobility: Rolling, Supine to Sit, Sit to Supine Rolling: Max assist   Supine to sit: Total assist, +2 for safety/equipment, HOB elevated Sit to supine: Total assist, +2 for physical assistance, +2 for safety/equipment   General bed mobility comments: HOB 30 degrees with pt total assist to move legs off bed and elevate trunk from surface. Pt with progression from max to min assist for sitting balance. Total +2 to return to supine.    Transfers                   General transfer comment: unable    Ambulation/Gait                   Stairs             Wheelchair Mobility    Modified Rankin (Stroke Patients Only)       Balance Overall balance assessment: Needs assistance Sitting-balance support: Bilateral upper extremity supported, Feet supported Sitting balance-Leahy Scale: Zero Sitting balance - Comments: max to min assist sitting EOB 5 min with max cues for posture and safety, no noted righting reactions                                    Cognition Arousal/Alertness: Awake/alert Behavior During Therapy: Flat affect Overall Cognitive Status: No family/caregiver present to determine baseline cognitive functioning Area of Impairment: Orientation, Attention, Memory, Following commands, Safety/judgement                 Orientation Level: Disoriented to, Time, Situation Current Attention Level: Sustained Memory: Decreased short-term memory Following Commands: Follows one step commands inconsistently, Follows one step commands with increased  time Safety/Judgement: Decreased awareness of deficits, Decreased awareness of safety Awareness: Intellectual Problem Solving: Slow processing, Difficulty sequencing, Decreased initiation, Requires verbal cues, Requires tactile cues General Comments: pt not oriented to day or  month. Pt states he is moving extremities in bed but no effortful movement to assist with sitting and rolling        Exercises General Exercises - Lower Extremity Short Arc Quad: AAROM, Both, 10 reps, Seated    General Comments        Pertinent Vitals/Pain Pain Assessment Faces Pain Scale: Hurts little more Pain Location: bil LE with movement and dependent positioning Pain Descriptors / Indicators: Discomfort, Grimacing, Guarding Pain Intervention(s): Limited activity within patient's tolerance, Monitored during session, Repositioned    Home Living                          Prior Function            PT Goals (current goals can now be found in the care plan section) Progress towards PT goals: Not progressing toward goals - comment (if pt continues to remain at total +2 assist with d/C after next session)    Frequency    Min 2X/week      PT Plan Current plan remains appropriate    Co-evaluation              AM-PAC PT "6 Clicks" Mobility   Outcome Measure  Help needed turning from your back to your side while in a flat bed without using bedrails?: Total Help needed moving from lying on your back to sitting on the side of a flat bed without using bedrails?: Total Help needed moving to and from a bed to a chair (including a wheelchair)?: Total Help needed standing up from a chair using your arms (e.g., wheelchair or bedside chair)?: Total Help needed to walk in hospital room?: Total Help needed climbing 3-5 steps with a railing? : Total 6 Click Score: 6    End of Session   Activity Tolerance: Patient limited by pain Patient left: in bed;with call bell/phone within reach;with bed alarm set Nurse Communication: Mobility status;Need for lift equipment PT Visit Diagnosis: Unsteadiness on feet (R26.81);Other abnormalities of gait and mobility (R26.89);Muscle weakness (generalized) (M62.81);Difficulty in walking, not elsewhere classified (R26.2)      Time: 0177-9390 PT Time Calculation (min) (ACUTE ONLY): 19 min  Charges:  $Therapeutic Activity: 8-22 mins                     Bayard Males, PT Acute Rehabilitation Services Office: [phone]    Lamarr Lulas 01/20/2023, 9:56 AM

## 2023-01-20 NOTE — TOC Progression Note (Signed)
Transition of Care [facility]) - Progression Note    Patient Details  Name: [name] MRN: [id number] Date of Birth: 18-Sep-1946  Transition of Care [facility]) CM/SW [place], RN Phone Number: 01/20/2023, 10:25 AM  Clinical Narrative:    CM noted that patient was approved for SNF admission to [facility]  The patient is scheduled for inpatient dialysis today and [place], Renal Navigator reached out to inpatient dialysis to have patient's dialysis moved up today for patient discharge to the facility likely tomorrow, per attending MD.    The patient has increased pain and transfer to SNF rehabilitation will be planned for tomorrow.  CM will continue to follow the patient for transfer to the facility on 01/21/23.  [facility] is    Expected Discharge Plan: [place] Barriers to Discharge: Continued Medical Work up  Expected Discharge Plan and Services In-house Referral: Clinical Social Work   Post Acute Care Choice: [place] Living arrangements for the past 2 months: [place] Expected Discharge Date: 01/16/23                                     Social Determinants of Health ([place]) Interventions SDOH Screenings   Food Insecurity: No Food Insecurity (01/05/2023)  Housing: Low Risk  (12/17/2022)  Transportation Needs: No Transportation Needs (12/17/2022)  Utilities: Not At Risk (12/17/2022)  Tobacco Use: Medium Risk (12/20/2022)    Readmission Risk Interventions    09/16/2022   12:31 PM 08/02/2022    2:23 PM  Readmission Risk Prevention Plan  Transportation Screening Complete Complete  Medication Review (RN Care Manager) Complete Complete  HRI or Home Care Consult Complete Complete  SW Recovery Care/Counseling Consult Complete Complete  Palliative Care Screening Not Applicable Not Applicable  Skilled Nursing Facility Not Applicable Complete

## 2023-01-20 NOTE — NC FL2 (Signed)
[place] LEVEL OF CARE FORM     IDENTIFICATION  Patient Name: [name] Birthdate: January 01, 1946 Sex: male Admission Date (Current Location): 12/12/2022  [facility] and [place] Number:  [employer] and Address:  [facility] [place]. [facility] [place] [address], [place], [place] [address]      Provider Number: [id number]  Attending Physician Name and Address:  Shawna Clamp, MD  Relative Name and Phone Number:  Jemarion Roycroft, brother - [phone]    Current Level of Care: Hospital Recommended Level of Care: [place] Prior Approval Number:    Date Approved/Denied:   PASRR Number: [id number]  Discharge Plan: SNF    Current Diagnoses: Patient Active Problem List   Diagnosis Date Noted   Paroxysmal atrial fibrillation ([place]) 01/03/2023   Malnutrition of moderate degree 12/27/2022   Protein-calorie malnutrition, severe 12/25/2022   Preoperative cardiovascular examination 12/17/2022   Abnormal EKG 12/13/2022   Nausea and vomiting 12/12/2022   Pressure injury of skin of sacral region 12/12/2022   Prolonged QT interval 12/12/2022   Generalized weakness 12/12/2022   Pressure injury of sacral region, stage 1 09/24/2022   Hypotension    Sepsis ([place]) 09/13/2022   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   ESRD (end stage renal disease) ([place]) 08/07/2022   HFrEF (heart failure with reduced ejection fraction) ([place])    (HFpEF) heart failure with preserved ejection fraction ([place]) 07/29/2022   Hypokalemia 07/29/2022   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   Acute respiratory failure with hypoxia ([place])    B12 deficiency 07/18/2022   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/2022   Diabetic neuropathy ([place]) 05/10/2022   Elevated troponin 05/10/2022   Type 2 diabetes mellitus with hyperglycemia ([place]) 05/10/2022   Other intervertebral disc degeneration, lumbar region 02/06/2022   Chronic gout, unspecified, 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ophi) 02/06/2022   Body mass index  (BMI) 35.0-35.9, adult 02/06/2022   Obesity 02/06/2022   Primary generalized (osteo)arthritis 02/06/2022   Chest pain 02/06/2022   Lumbago with sciatica, right side 08/28/2021   Weakness of both lower extremities 08/28/2021   Body mass index (BMI) 33.0-33.9, adult 08/28/2021   Constipation 02/15/2021   Abdominal pain 01/02/2021   Diabetes mellitus ([place]) 11/09/2020   Hypertensive disorder 11/09/2020   Hypercholesterolemia 11/09/2020   H/O adenomatous polyp of colon 05/05/2020   Iron deficiency anemia 05/05/2020   Chronic kidney disease, stage IV (severe) ([place]) 12/14/2019   Acute gouty arthritis 10/25/2019   Leukocytosis 04/02/2019   Cellulitis of right hand 04/01/2019   Osteoarthritis of ankle and foot 02/08/2019   Heart failure with improved ejection fraction (HFimpEF) ([place]) 07/05/2015   Acute congestive heart failure ([place]) 07/04/2015   Acute on chronic diastolic congestive heart failure ([place]) 07/04/2015   Mixed hyperlipidemia 07/04/2015   Anxiety state 07/04/2015   GERD (gastroesophageal reflux disease) 07/04/2015   Heart block 07/04/2015   Gastroesophageal reflux disease 96/28/3662   Diastolic CHF, chronic (HCC) 03/11/2015   Chronic diastolic heart failure ([place]) 03/11/2015   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   Knee stiffness 01/13/2014   Gout 12/11/2013   Expected blood loss anemia 11/17/2013   S/P right TKA 11/15/2013   Anemia, normocytic normochromic 01/12/2013   Acute kidney injury superimposed on chronic kidney disease ([place]) 12/10/2012   Diabetes mellitus type 2 in obese ([place]) 12/10/2012   Coronary artery disease involving native coronary artery of native heart without angina pectoris 11/24/2012   Overweight(278.02) 11/17/2012   Chronic kidney disease, stage 3, cardiorenal syndrome 08/23/2011   Chronic kidney  disease, stage 3 unspecified ([place]) 08/23/2011   Essential hypertension     Orientation RESPIRATION BLADDER Height & Weight     Self, Time, Situation, Place  Normal  External catheter Weight: 83 kg Height:  '6\' 3"'$  (190.5 cm)  BEHAVIORAL SYMPTOMS/MOOD NEUROLOGICAL BOWEL NUTRITION STATUS      Continent Diet (See discharge summary)  AMBULATORY STATUS COMMUNICATION OF NEEDS Skin   Total Care Verbally Other (Comment) (Stage 4 - sacrum with exposed bone - daily dressing changes)                       Personal Care Assistance Level of Assistance  Bathing, Feeding, Dressing Bathing Assistance: Maximum assistance Feeding assistance: Limited assistance Dressing Assistance: Maximum assistance     Functional Limitations Info  Sight Sight Info: Impaired Hearing Info: Adequate Speech Info: Adequate    SPECIAL CARE FACTORS FREQUENCY  PT (By licensed PT), OT (By licensed OT)     PT Frequency: 3-5 x per week OT Frequency: 3-5 x per week            Contractures Contractures Info: Not present    Additional Factors Info  Code Status, Allergies, Psychotropic Code Status Info: Full code Allergies Info: Metformin, niacin           Current Medications (01/20/2023):  This is the current hospital active medication list Current Facility-Administered Medications  Medication Dose Route Frequency Provider Last Rate Last Admin   (feeding supplement) PROSource Plus liquid 30 mL  30 mL Oral BID BM Darrick Meigs, Gagan S, MD   30 mL at 01/20/23 1051   0.9 %  sodium chloride infusion   Intravenous PRN Elodia Florence., MD   Stopped at 01/06/23 0425   acetaminophen (TYLENOL) tablet 650 mg  650 mg Oral Q4H PRN Stark Klein, MD   650 mg at 01/15/23 0759   acetaminophen (TYLENOL) tablet 650 mg  650 mg Oral TID Bonnielee Haff, MD   650 mg at 01/20/23 1051   allopurinol (ZYLOPRIM) tablet 100 mg  100 mg Oral Daily Stark Klein, MD   100 mg at 01/20/23 1052   alteplase (CATHFLO ACTIVASE) injection 2 mg  2 mg Intracatheter Once PRN Lynnda Child, PA-C       amoxicillin-clavulanate (AUGMENTIN) 500-125 MG per tablet 1 tablet  1 tablet Oral q1800 Rozann Lesches,  RPH   1 tablet at 01/19/23 1741   anticoagulant sodium citrate solution 5 mL  5 mL Intracatheter PRN Lynnda Child, PA-C       aspirin EC tablet 81 mg  81 mg Oral Daily Stark Klein, MD   81 mg at 01/20/23 1051   atorvastatin (LIPITOR) tablet 40 mg  40 mg Oral Daily Stark Klein, MD   40 mg at 01/20/23 1051   bisacodyl (DULCOLAX) suppository 10 mg  10 mg Rectal Daily PRN Patrecia Pour, MD   10 mg at 01/07/23 1508   Chlorhexidine Gluconate Cloth 2 % PADS 6 each  6 each Topical Q0600 Elodia Florence., MD   6 each at 01/20/23 [phone]   cycloSPORINE (RESTASIS) 0.05 % ophthalmic emulsion 1 drop  1 drop Both Eyes BID Stark Klein, MD   1 drop at 01/20/23 1052   Darbepoetin Alfa (ARANESP) injection 200 mcg  200 mcg Subcutaneous Q Fri-1800 Lynnda Child, PA-C   200 mcg at 01/17/23 1801   feeding supplement (ENSURE ENLIVE / ENSURE PLUS) liquid 237 mL  237 mL Oral TID WC [place],  Marge Duncans, MD   237 mL at 01/20/23 1053   gabapentin (NEURONTIN) capsule 100 mg  100 mg Oral TID Etta Quill, DO   100 mg at 01/20/23 1052   Gerhardt's butt cream   Topical BID Elodia Florence., MD   Given at 01/19/23 2141   heparin injection 1,000 Units  1,000 Units Intracatheter PRN Lynnda Child, PA-C       heparin injection 4,000 Units  4,000 Units Dialysis PRN Lynnda Child, PA-C       heparin injection 5,000 Units  5,000 Units Subcutaneous Camelia Phenes Stark Klein, MD   5,000 Units at 01/20/23 0505   isosorbide dinitrate (ISORDIL) tablet 5 mg  5 mg Oral BID Irene Pap N, DO   5 mg at 01/20/23 1051   lidocaine (PF) (XYLOCAINE) 1 % injection 5 mL  5 mL Intradermal PRN Lynnda Child, PA-C       lidocaine-prilocaine (EMLA) cream 1 Application  1 Application Topical PRN Lynnda Child, PA-C       LORazepam (ATIVAN) injection 0.25 mg  0.25 mg Intravenous Q4H PRN Elodia Florence., MD   0.25 mg at 01/01/23 2012   melatonin tablet 4.5 mg  4.5 mg Oral QHS Stark Klein, MD    4.5 mg at 01/19/23 2141   midodrine (PROAMATINE) tablet 10 mg  10 mg Oral Q M,W,F-HD Valentina Gu, NP   10 mg at 01/20/23 1122   multivitamin (RENA-VIT) tablet 1 tablet  1 tablet Oral QHS Oswald Hillock, MD   1 tablet at 01/19/23 2139   nitroGLYCERIN (NITROSTAT) SL tablet 0.4 mg  0.4 mg Sublingual Q5 min PRN Stark Klein, MD       oxyCODONE (Oxy IR/ROXICODONE) immediate release tablet 2.5-5 mg  2.5-5 mg Oral Q4H PRN Stark Klein, MD   5 mg at 01/20/23 1122   pentafluoroprop-tetrafluoroeth (GEBAUERS) aerosol 1 Application  1 Application Topical PRN Lynnda Child, PA-C       polyethylene glycol (MIRALAX / GLYCOLAX) packet 17 g  17 g Oral Daily PRN Irene Pap N, DO   17 g at 01/11/23 2140   polyethylene glycol (MIRALAX / GLYCOLAX) packet 17 g  17 g Oral BID Bonnielee Haff, MD   17 g at 01/20/23 1051   polyvinyl alcohol (LIQUIFILM TEARS) 1.4 % ophthalmic solution 1 drop  1 drop Both Eyes Daily PRN Stark Klein, MD       prochlorperazine (COMPAZINE) injection 5 mg  5 mg Intravenous Q6H PRN Irene Pap N, DO   5 mg at 01/17/23 1531   senna-docusate (Senokot-S) tablet 2 tablet  2 tablet Oral BID Patrecia Pour, MD   2 tablet at 01/20/23 1052   simethicone (MYLICON) chewable tablet 80 mg  80 mg Oral Q6H PRN Stark Klein, MD   80 mg at 01/11/23 2142   sodium chloride flush (NS) 0.9 % injection 3 mL  3 mL Intravenous Q12H Stark Klein, MD   3 mL at 01/20/23 1102   sodium phosphate (FLEET) 7-19 GM/118ML enema 1 enema  1 enema Rectal Once Bonnielee Haff, MD       sorbitol 70 % solution 30 mL  30 mL Oral Daily PRN Patrecia Pour, MD       traMADol Veatrice [place]) tablet 50 mg  50 mg Oral QID PRN Stark Klein, MD   50 mg at 01/17/23 9833     Discharge Medications: Please see discharge summary for a list of discharge medications.  Relevant Imaging Results:  Relevant Lab Results:   Additional Information SSN [id number], HD at [facility] MWF - facility to provide  transportation  [name], [place]

## 2023-01-20 NOTE — Progress Notes (Signed)
Wound care was not provided.  Patient still off the floor for dialysis.

## 2023-01-20 NOTE — Progress Notes (Signed)
PROGRESS NOTE    [name]  NFA:[id number] DOB: 12/01/1946 DOA: 12/12/2022 PCP: Redmond School, MD   Brief Narrative:  This 77 year old male with past medical history of ESRD on hemodialysis Monday Wednesday Friday, HFrEF, diabetes mellitus type 2, hypertension who presented with nausea. Patient has known history of cholecystitis and underwent cholecystostomy tube placement. His tube had become dislodged and he underwent evaluation with surgery. He was noted to have recanalization of the bile duct therefore tube was not replaced. He came to hospital for elective cholecystectomy, He underwent preop evaluation with cardiology. His workup was notable for diffuse T wave inversions and prolonged QTc. Also was found to be significantly hypokalemic. He was started on electrolyte replacement. Nephrology was consulted for chronic hemodialysis during hospitalization. He was transferred to [facility] for Left heart cath and noted to have multivessel diffuse moderate CAD without obvious flow limiting lesion. He was cleared for surgery and is now post cholecystecomy on 12/28.  Hospitalization c/b presence of sacral decubitus ulcer.  S/p hydrotherapy.  Currently on antibiotics.  Issue now is poor PO intake.  Had NG tube in place briefly.  Watching PO intake at this time to make decision regarding pursuing possible PEG.    Assessment & Plan:   Principal Problem:   Chronic cholecystitis Active Problems:   ESRD (end stage renal disease) ([place])   Coronary artery disease involving native coronary artery of native heart without angina pectoris   Hypokalemia   Diabetes mellitus type 2 in obese (HCC)   Anemia, normocytic normochromic   Essential hypertension   Heart failure with improved ejection fraction (HFimpEF) (HCC)   HFrEF (heart failure with reduced ejection fraction) (HCC)   Nausea and vomiting   Pressure injury of skin of sacral region   Prolonged QT interval   Generalized weakness   Abnormal EKG    Preoperative cardiovascular examination   Protein-calorie malnutrition, severe   Malnutrition of moderate degree   Paroxysmal atrial fibrillation (HCC)   Stage IV Sacral Decubitus Ulcer: Wound culture growing E. Coli resistant to ampicillin/unasyn, E. faecalis resistant to vancomycin, pansensitive proteus. He completed course of hydrotherapy CT with subcutaneous fat straining with complex collection posterior to the sacrum on the left measuring 2.5x1.2cm concerning for abscess, no evidence of osteo E. Coli and proteus treated x 7 days with cefepime.   E. Faecalis treated with ampicillin completed course 1/15.   Sacral MRI could not be done due to the presence of the nerve stimulator per surgery, no abscess but packing in the place of ulcer. Continue oxycodone 5 mg every 4 hours as needed Continue local wound care as per wound care RN recommendation Patient was noted to be febrile on 1/22.  WBC noted to be higher. However fever appears to have resolved. Based on his previous cultures, Patient was started on Augmentin.    Chronic systolic and diastolic CHF: Echo showed LVEF 50 to 55%. Previous LVEF was 35 to 40% in August 2023. He underwent LHC which showed multivessel diffuse moderate CAD without any acute or obvious flow-limiting lesions. Continue medical management. Noted to be on aspirin, statin, nitrates. Patient is stable from cardiac standpoint.   End-stage renal disease: Nephrology is following.  Continue dialysis as per schedule. He was able to sit for his dialysis session on Friday. He needs to be able to sit for the sessions before he can be accepted at an outpatient dialysis clinic. He was able to sit for his dialysis for the last 2-3 sessions.  May need  premedication with pain meds for his sacral wounds before he goes for dialysis so that he is able to sit for the sessions.   Hypokalemia / Hyponatremia:  Replaced. Resolved.   Chronic cholecystitis: Now status post  cholecystectomy.   Per general surgery; pneumoperitoneum is likely from lap chole. Stable.   Constipation: Continue bowel regimen.   Anemia of chronic kidney disease: No evidence for overt bleeding.  Hemoglobin stable.   Diabetes mellitus type 2: HbA1c 4.4 (likely reflecting his recent failure to thrive, poor PO intake).   Functional Quadriplegia: Essentially bedbound for the past year (needs help toileting, turning in bed, dressing, etc).   Hypophosphatemia/hypomagnesemia: -Repleted.   Moderate Protein calorie malnutrition  Poor PO intake: -BMI 22.98 kg/m -had cortrak to supplement nutrition in setting of his decubitus ulcer, but this was d/c'd after about 1 week  Oral intake has significantly improved.  No need for PEG tube at this time.  Pressure Injury 11/06/22 Sacrum Mid Stage 4 - Full thickness tissue loss with exposed bone, tendon or muscle. (Active)  11/06/22 1315  Location: Sacrum  Location Orientation: Mid  Staging: Stage 4 - Full thickness tissue loss with exposed bone, tendon or muscle.  Wound Description (Comments):   Present on Admission: Yes     DVT prophylaxis: Heparin Code Status: (Full code) Family Communication: No family at bed side Disposition Plan:   Anticipated discharge to SNF tomorrow.  Consultants:  Nephrology  Procedures: As above  Antimicrobials:  Anti-infectives (From admission, onward)    Start     Dose/Rate Route Frequency Ordered Stop   01/16/23 0000  amoxicillin-clavulanate (AUGMENTIN) 500-125 MG tablet        1 tablet Oral Daily-1800 01/16/23 0932 01/23/23 2359   01/15/23 1800  amoxicillin-clavulanate (AUGMENTIN) 500-125 MG per tablet 1 tablet        1 tablet Oral Daily-1800 01/15/23 1102 01/22/23 1759   12/30/22 2200  ampicillin (OMNIPEN) 2 g in sodium chloride 0.9 % 100 mL IVPB        2 g 300 mL/hr over 20 Minutes Intravenous Every 12 hours 12/30/22 1519 01/06/23 0445   12/30/22 1145  linezolid (ZYVOX) IVPB 600 mg  Status:   Discontinued        600 mg 300 mL/hr over 60 Minutes Intravenous Every 12 hours 12/30/22 1056 12/30/22 1519   12/28/22 0000  vancomycin (VANCOCIN) IVPB 1000 mg/200 mL premix  Status:  Discontinued        1,000 mg 200 mL/hr over 60 Minutes Intravenous Every M-W-F (Hemodialysis) 12/27/22 1354 12/30/22 1056   12/27/22 1445  vancomycin (VANCOCIN) IVPB 1000 mg/200 mL premix        1,000 mg 200 mL/hr over 60 Minutes Intravenous  Once 12/27/22 1354 12/27/22 1738   12/27/22 1200  vancomycin (VANCOCIN) IVPB 1000 mg/200 mL premix  Status:  Discontinued        1,000 mg 200 mL/hr over 60 Minutes Intravenous Every M-W-F (Hemodialysis) 12/25/22 1831 12/27/22 1354   12/26/22 2100  ceFEPIme (MAXIPIME) 1 g in sodium chloride 0.9 % 100 mL IVPB        1 g 200 mL/hr over 30 Minutes Intravenous Every 24 hours 12/26/22 1052 12/31/22 2034   12/25/22 1900  ceFEPIme (MAXIPIME) 1 g in sodium chloride 0.9 % 100 mL IVPB  Status:  Discontinued        1 g 200 mL/hr over 30 Minutes Intravenous Every 24 hours 12/25/22 1812 12/26/22 1052   12/25/22 1900  vancomycin (VANCOREADY) IVPB 1750 mg/350  mL        1,750 mg 175 mL/hr over 120 Minutes Intravenous  Once 12/25/22 1812 12/26/22 0146   12/25/22 1900  metroNIDAZOLE (FLAGYL) IVPB 500 mg        500 mg 100 mL/hr over 60 Minutes Intravenous Every 12 hours 12/25/22 1812 01/01/23 0700   12/13/22 0600  cefoTEtan (CEFOTAN) 2 g in sodium chloride 0.9 % 100 mL IVPB  Status:  Discontinued        2 g 200 mL/hr over 30 Minutes Intravenous On call to O.R. 12/12/22 1631 12/14/22 0559   12/12/22 0615  cefoTEtan (CEFOTAN) 2 g in sodium chloride 0.9 % 100 mL IVPB  Status:  Discontinued        2 g 200 mL/hr over 30 Minutes Intravenous On call to O.R. 12/12/22 0240 12/12/22 0913      Subjective: Patient was seen and examined at bedside.  Overnight events noted.   Patient reports doing better, states having severe pain in his back today.   Patient is going to have dialysis  today.  Objective: Vitals:   01/19/23 1607 01/19/23 1959 01/20/23 0524 01/20/23 0753  BP: 119/72 102/73 (!) 140/56 115/63  Pulse: 87 90 85 100  Resp: '16 16 18 16  '$ Temp: 98.1 F (36.7 C) 99.7 F (37.6 C) 99.2 F (37.3 C) 99 F (37.2 C)  TempSrc: Oral Oral Oral Oral  SpO2: 97% 97% 94% 98%  Weight:      Height:        Intake/Output Summary (Last 24 hours) at 01/20/2023 1230 Last data filed at 01/20/2023 0536 Gross per 24 hour  Intake 500 ml  Output --  Net 500 ml   Filed Weights   01/13/23 0545 01/18/23 0500 01/19/23 0500  Weight: 88 kg 85.3 kg 83 kg    Examination:  General exam: Appears comfortable, not in any acute distress. Very deconditioned. Respiratory system: Clear to auscultation. Respiratory effort normal.  RR 15 Cardiovascular system: S1 & S2 heard, regular rate and rhythm, no murmur. Gastrointestinal system: Abdomen is soft, non tender, non distended, BS+ Central nervous system: Alert and oriented x 2. No focal neurological deficits. Extremities:  No edema, no cyanosis, no clubbing. Skin: No rashes, lesions or ulcers Psychiatry: Judgement and insight appear normal. Mood & affect appropriate.     Data Reviewed: I have personally reviewed following labs and imaging studies  CBC: Recent Labs  Lab 01/13/23 1623 01/15/23 0820 01/16/23 0544 01/17/23 0742  WBC 10.5 13.5* 11.7* 11.3*  HGB 8.2* 8.2* 9.3* 8.0*  HCT 27.0* 26.7* 28.7* 24.9*  MCV 103.1* 103.1* 100.7* 100.4*  PLT 360 325 276 973   Basic Metabolic Panel: Recent Labs  Lab 01/13/23 1624 01/15/23 1551 01/17/23 0742 01/20/23 0942  NA 136 133* 134* 139  K 4.7 3.9 3.2* 4.6  CL 101 92* 95* 99  CO2 '25 28 28 25  '$ GLUCOSE 145* 125* 128* 139*  BUN 54* 20 49* 62*  CREATININE 4.60* 2.25* 4.32* 5.03*  CALCIUM 10.1 9.5 10.0 10.4*  PHOS 4.9* 2.9 4.6  --    GFR: Estimated Creatinine Clearance: 14.4 mL/min (A) (by C-G formula based on SCr of 5.03 mg/dL (H)). Liver Function Tests: Recent Labs  Lab  01/13/23 1624 01/15/23 1551 01/17/23 0742  ALBUMIN 2.0* 2.1* 2.0*   No results for input(s): "LIPASE", "AMYLASE" in the last 168 hours. No results for input(s): "AMMONIA" in the last 168 hours. Coagulation Profile: No results for input(s): "INR", "PROTIME" in the last 168 hours.  Cardiac Enzymes: No results for input(s): "CKTOTAL", "CKMB", "CKMBINDEX", "TROPONINI" in the last 168 hours. BNP (last 3 results) No results for input(s): "PROBNP" in the last 8760 hours. HbA1C: No results for input(s): "HGBA1C" in the last 72 hours. CBG: Recent Labs  Lab 01/19/23 0613 01/19/23 0756 01/19/23 1150 01/19/23 1657 01/20/23 0755  GLUCAP 98 100* 128* 127* 167*   Lipid Profile: No results for input(s): "CHOL", "HDL", "LDLCALC", "TRIG", "CHOLHDL", "LDLDIRECT" in the last 72 hours. Thyroid Function Tests: No results for input(s): "TSH", "T4TOTAL", "FREET4", "T3FREE", "THYROIDAB" in the last 72 hours. Anemia Panel: No results for input(s): "VITAMINB12", "FOLATE", "FERRITIN", "TIBC", "IRON", "RETICCTPCT" in the last 72 hours. Sepsis Labs: No results for input(s): "PROCALCITON", "LATICACIDVEN" in the last 168 hours.  No results found for this or any previous visit (from the past 240 hour(s)).   Radiology Studies: No results found.  Scheduled Meds:  (feeding supplement) PROSource Plus  30 mL Oral BID BM   acetaminophen  650 mg Oral TID   allopurinol  100 mg Oral Daily   amoxicillin-clavulanate  1 tablet Oral q1800   aspirin EC  81 mg Oral Daily   atorvastatin  40 mg Oral Daily   Chlorhexidine Gluconate Cloth  6 each Topical Q0600   cycloSPORINE  1 drop Both Eyes BID   darbepoetin (ARANESP) injection - DIALYSIS  200 mcg Subcutaneous Q Fri-1800   feeding supplement  237 mL Oral TID WC   gabapentin  100 mg Oral TID   Gerhardt's butt cream   Topical BID   heparin  5,000 Units Subcutaneous Q8H   isosorbide dinitrate  5 mg Oral BID   melatonin  4.5 mg Oral QHS   midodrine  10 mg Oral Q  M,W,F-HD   multivitamin  1 tablet Oral QHS   polyethylene glycol  17 g Oral BID   senna-docusate  2 tablet Oral BID   sodium chloride flush  3 mL Intravenous Q12H   sodium phosphate  1 enema Rectal Once   Continuous Infusions:  sodium chloride Stopped (01/06/23 0425)   anticoagulant sodium citrate       LOS: 38 days    Time spent: 50 mins    Mc Bloodworth, MD [facility]   If 7PM-7AM, please contact night-coverage

## 2023-01-20 NOTE — Progress Notes (Addendum)
[facility] Progress Note   Subjective:  Seen in room - sitting in recliner. For HD later today and for discharge tomorrow per notes. He denies CP or dyspnea.  Objective Vitals:   01/19/23 1607 01/19/23 1959 01/20/23 0524 01/20/23 0753  BP: 119/72 102/73 (!) 140/56 115/63  Pulse: 87 90 85 100  Resp: '16 16 18 16  '$ Temp: 98.1 F (36.7 C) 99.7 F (37.6 C) 99.2 F (37.3 C) 99 F (37.2 C)  TempSrc: Oral Oral Oral Oral  SpO2: 97% 97% 94% 98%  Weight:      Height:       Physical Exam General: Chronically ill appearing man, NAD. Room air. Heart: RRR; no murmur Lungs: CTA anteriorly Abdomen: soft Extremities: no LE edema Dialysis Access: TDC in R chest  Additional Objective Labs: Basic Metabolic Panel: Recent Labs  Lab 01/13/23 1624 01/15/23 1551 01/17/23 0742 01/20/23 0942  NA 136 133* 134* 139  K 4.7 3.9 3.2* 4.6  CL 101 92* 95* 99  CO2 '25 28 28 25  '$ GLUCOSE 145* 125* 128* 139*  BUN 54* 20 49* 62*  CREATININE 4.60* 2.25* 4.32* 5.03*  CALCIUM 10.1 9.5 10.0 10.4*  PHOS 4.9* 2.9 4.6  --    Liver Function Tests: Recent Labs  Lab 01/13/23 1624 01/15/23 1551 01/17/23 0742  ALBUMIN 2.0* 2.1* 2.0*   No results for input(s): "LIPASE", "AMYLASE" in the last 168 hours. CBC: Recent Labs  Lab 01/13/23 1623 01/15/23 0820 01/16/23 0544 01/17/23 0742  WBC 10.5 13.5* 11.7* 11.3*  HGB 8.2* 8.2* 9.3* 8.0*  HCT 27.0* 26.7* 28.7* 24.9*  MCV 103.1* 103.1* 100.7* 100.4*  PLT 360 325 276 328   Blood Culture    Component Value Date/Time   SDES WOUND 12/25/2022 1715   SPECREQUEST NONE 12/25/2022 1715   CULT  12/25/2022 1715    FEW ESCHERICHIA COLI ABUNDANT PROTEUS MIRABILIS ABUNDANT ENTEROCOCCUS FAECALIS VANCOMYCIN RESISTANT ENTEROCOCCUS    REPTSTATUS 12/29/2022 FINAL 12/25/2022 1715   Medications:  sodium chloride Stopped (01/06/23 0425)   anticoagulant sodium citrate      (feeding supplement) PROSource Plus  30 mL Oral BID BM   acetaminophen   650 mg Oral TID   allopurinol  100 mg Oral Daily   amoxicillin-clavulanate  1 tablet Oral q1800   aspirin EC  81 mg Oral Daily   atorvastatin  40 mg Oral Daily   Chlorhexidine Gluconate Cloth  6 each Topical Q0600   cycloSPORINE  1 drop Both Eyes BID   darbepoetin (ARANESP) injection - DIALYSIS  200 mcg Subcutaneous Q Fri-1800   feeding supplement  237 mL Oral TID WC   gabapentin  100 mg Oral TID   Gerhardt's butt cream   Topical BID   heparin  5,000 Units Subcutaneous Q8H   isosorbide dinitrate  5 mg Oral BID   melatonin  4.5 mg Oral QHS   midodrine  10 mg Oral Q M,W,F-HD   multivitamin  1 tablet Oral QHS   polyethylene glycol  17 g Oral BID   senna-docusate  2 tablet Oral BID   sodium chloride flush  3 mL Intravenous Q12H   sodium phosphate  1 enema Rectal Once    Dialysis Orders: [facility] MWF 4h  400/800  84.5kg  2/2.5 bath   [facility] TDC - Heparin 4000 units IV  + 3000 units IV mid run - last OP HD 12/20, post wt 85.9kg - last IP hD 12/24, post wt 85kg - mircera 225 mcg IV q2, last  12/18, due 1/02 - no vdra   Assessment/Plan: ESRD - on HD MWF - for HD this afternoon. He reports history of vertigo and how it worsens on HD. Dialyzing in chair as tolerated. Ongoing pain, now giving pain meds pre HD to see how he tolerates and simulate outpatient setting.  Would benefit from having designated cushion to use during dialysis to help with off loading pressure on wound.  Hypokalemia: Using higher K bath with HD. Sacral decubitus ulcer/Stage IV - Per primary. Completed hydrotherapy/course of antibiotics. No surgical intervention needed. Surgery signed off.  Continue wound care.  Chronic cholecystitis - w/ nausea, malnutrition, FTT.  S/p laparoscopic cholecystectomy. Improving. Chronic HFrEF - LVEF up to 50-55% here, diffuse CAD. Volume managed and optimized with HD.  HTN/ volume - BP remain soft/stable. On midodrine 10 mg PO TIW.  No volume excess. UF as tolerated. Keep SBP >100. Under EDW if  wts are correct. Lower EDW on discharge.  Anemia -Last Hgb 8.0 S/p 1 unit PRBC 12/31/2022. Aranesp 200 q Friday. Tsat low, ferritin high.  Hold IV iron for now. Will repeat Iron panel with HD 01/20/2023. May need to override Ferritin and view as a marker of inflammation and load with Fe.  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 No binder or VDRA currently. On regular diet because of FTT/nutrition DM2 - per pmd   Nutrition - Albumin low/FTT. On regular diet + protein supp per RD.  GOC- Palliative Care was consulted --Full code/full scope of care.  Dispo - Plan for SNF - hopefully d/c tomorrow.  Veneta Penton, PA-C 01/20/2023, 12:19 PM  [place]  Nephrology attending: I have personally seen and examined the patient.  Chart reviewed.  I agree with assessment and plan as above. Reportedly patient has been dialyzing in chair.  He looks debilitated and frail.  Awaiting safe discharge plan to SNF.  Regular dialysis today. Katheran James, [facility].

## 2023-01-21 DIAGNOSIS — I132 Hypertensive heart and chronic kidney disease with heart failure and with stage 5 chronic kidney disease, or end stage renal disease: Secondary | ICD-10-CM | POA: Diagnosis not present

## 2023-01-21 DIAGNOSIS — N2581 Secondary hyperparathyroidism of renal origin: Secondary | ICD-10-CM | POA: Diagnosis not present

## 2023-01-21 DIAGNOSIS — R7881 Bacteremia: Secondary | ICD-10-CM | POA: Diagnosis not present

## 2023-01-21 DIAGNOSIS — T827XXA Infection and inflammatory reaction due to other cardiac and vascular devices, implants and grafts, initial encounter: Secondary | ICD-10-CM | POA: Diagnosis not present

## 2023-01-21 DIAGNOSIS — I959 Hypotension, unspecified: Secondary | ICD-10-CM | POA: Diagnosis not present

## 2023-01-21 DIAGNOSIS — S31000D Unspecified open wound of lower back and pelvis without penetration into retroperitoneum, subsequent encounter: Secondary | ICD-10-CM | POA: Diagnosis not present

## 2023-01-21 DIAGNOSIS — N186 End stage renal disease: Secondary | ICD-10-CM | POA: Diagnosis not present

## 2023-01-21 DIAGNOSIS — I82611 Acute embolism and thrombosis of superficial veins of right upper extremity: Secondary | ICD-10-CM | POA: Diagnosis not present

## 2023-01-21 DIAGNOSIS — I5022 Chronic systolic (congestive) heart failure: Secondary | ICD-10-CM | POA: Diagnosis not present

## 2023-01-21 DIAGNOSIS — A419 Sepsis, unspecified organism: Secondary | ICD-10-CM | POA: Diagnosis not present

## 2023-01-21 DIAGNOSIS — M869 Osteomyelitis, unspecified: Secondary | ICD-10-CM | POA: Diagnosis not present

## 2023-01-21 DIAGNOSIS — R400 Somnolence: Secondary | ICD-10-CM | POA: Diagnosis not present

## 2023-01-21 DIAGNOSIS — R64 Cachexia: Secondary | ICD-10-CM | POA: Diagnosis not present

## 2023-01-21 DIAGNOSIS — D509 Iron deficiency anemia, unspecified: Secondary | ICD-10-CM | POA: Diagnosis not present

## 2023-01-21 DIAGNOSIS — M4628 Osteomyelitis of vertebra, sacral and sacrococcygeal region: Secondary | ICD-10-CM | POA: Diagnosis not present

## 2023-01-21 DIAGNOSIS — E119 Type 2 diabetes mellitus without complications: Secondary | ICD-10-CM | POA: Diagnosis not present

## 2023-01-21 DIAGNOSIS — L89154 Pressure ulcer of sacral region, stage 4: Secondary | ICD-10-CM | POA: Diagnosis not present

## 2023-01-21 DIAGNOSIS — R532 Functional quadriplegia: Secondary | ICD-10-CM | POA: Diagnosis not present

## 2023-01-21 DIAGNOSIS — D62 Acute posthemorrhagic anemia: Secondary | ICD-10-CM | POA: Diagnosis not present

## 2023-01-21 DIAGNOSIS — B9620 Unspecified Escherichia coli [E. coli] as the cause of diseases classified elsewhere: Secondary | ICD-10-CM | POA: Diagnosis not present

## 2023-01-21 DIAGNOSIS — Z1621 Resistance to vancomycin: Secondary | ICD-10-CM | POA: Diagnosis not present

## 2023-01-21 DIAGNOSIS — Z743 Need for continuous supervision: Secondary | ICD-10-CM | POA: Diagnosis not present

## 2023-01-21 DIAGNOSIS — W19XXXA Unspecified fall, initial encounter: Secondary | ICD-10-CM | POA: Diagnosis present

## 2023-01-21 DIAGNOSIS — I480 Paroxysmal atrial fibrillation: Secondary | ICD-10-CM | POA: Diagnosis not present

## 2023-01-21 DIAGNOSIS — Z7401 Bed confinement status: Secondary | ICD-10-CM | POA: Diagnosis not present

## 2023-01-21 DIAGNOSIS — R531 Weakness: Secondary | ICD-10-CM | POA: Diagnosis not present

## 2023-01-21 DIAGNOSIS — R404 Transient alteration of awareness: Secondary | ICD-10-CM | POA: Diagnosis not present

## 2023-01-21 DIAGNOSIS — L89159 Pressure ulcer of sacral region, unspecified stage: Secondary | ICD-10-CM | POA: Diagnosis not present

## 2023-01-21 DIAGNOSIS — E43 Unspecified severe protein-calorie malnutrition: Secondary | ICD-10-CM | POA: Diagnosis not present

## 2023-01-21 DIAGNOSIS — I499 Cardiac arrhythmia, unspecified: Secondary | ICD-10-CM | POA: Diagnosis not present

## 2023-01-21 DIAGNOSIS — I120 Hypertensive chronic kidney disease with stage 5 chronic kidney disease or end stage renal disease: Secondary | ICD-10-CM | POA: Diagnosis not present

## 2023-01-21 DIAGNOSIS — E1122 Type 2 diabetes mellitus with diabetic chronic kidney disease: Secondary | ICD-10-CM | POA: Diagnosis not present

## 2023-01-21 DIAGNOSIS — Z7189 Other specified counseling: Secondary | ICD-10-CM | POA: Diagnosis not present

## 2023-01-21 DIAGNOSIS — I2510 Atherosclerotic heart disease of native coronary artery without angina pectoris: Secondary | ICD-10-CM | POA: Diagnosis not present

## 2023-01-21 DIAGNOSIS — R410 Disorientation, unspecified: Secondary | ICD-10-CM | POA: Diagnosis not present

## 2023-01-21 DIAGNOSIS — I429 Cardiomyopathy, unspecified: Secondary | ICD-10-CM | POA: Diagnosis not present

## 2023-01-21 DIAGNOSIS — Z992 Dependence on renal dialysis: Secondary | ICD-10-CM | POA: Diagnosis not present

## 2023-01-21 DIAGNOSIS — K812 Acute cholecystitis with chronic cholecystitis: Secondary | ICD-10-CM | POA: Diagnosis not present

## 2023-01-21 DIAGNOSIS — G9341 Metabolic encephalopathy: Secondary | ICD-10-CM | POA: Diagnosis not present

## 2023-01-21 DIAGNOSIS — Z1152 Encounter for screening for COVID-19: Secondary | ICD-10-CM | POA: Diagnosis not present

## 2023-01-21 DIAGNOSIS — I5042 Chronic combined systolic (congestive) and diastolic (congestive) heart failure: Secondary | ICD-10-CM | POA: Diagnosis not present

## 2023-01-21 DIAGNOSIS — E669 Obesity, unspecified: Secondary | ICD-10-CM | POA: Diagnosis not present

## 2023-01-21 DIAGNOSIS — E1169 Type 2 diabetes mellitus with other specified complication: Secondary | ICD-10-CM | POA: Diagnosis not present

## 2023-01-21 DIAGNOSIS — R6521 Severe sepsis with septic shock: Secondary | ICD-10-CM | POA: Diagnosis not present

## 2023-01-21 DIAGNOSIS — A4151 Sepsis due to Escherichia coli [E. coli]: Secondary | ICD-10-CM | POA: Diagnosis present

## 2023-01-21 DIAGNOSIS — Z48815 Encounter for surgical aftercare following surgery on the digestive system: Secondary | ICD-10-CM | POA: Diagnosis not present

## 2023-01-21 DIAGNOSIS — I472 Ventricular tachycardia, unspecified: Secondary | ICD-10-CM | POA: Diagnosis not present

## 2023-01-21 DIAGNOSIS — D689 Coagulation defect, unspecified: Secondary | ICD-10-CM | POA: Diagnosis not present

## 2023-01-21 DIAGNOSIS — E440 Moderate protein-calorie malnutrition: Secondary | ICD-10-CM | POA: Diagnosis not present

## 2023-01-21 DIAGNOSIS — E875 Hyperkalemia: Secondary | ICD-10-CM | POA: Diagnosis not present

## 2023-01-21 DIAGNOSIS — D631 Anemia in chronic kidney disease: Secondary | ICD-10-CM | POA: Diagnosis not present

## 2023-01-21 DIAGNOSIS — Z515 Encounter for palliative care: Secondary | ICD-10-CM | POA: Diagnosis not present

## 2023-01-21 DIAGNOSIS — K811 Chronic cholecystitis: Secondary | ICD-10-CM | POA: Diagnosis not present

## 2023-01-21 DIAGNOSIS — E876 Hypokalemia: Secondary | ICD-10-CM | POA: Diagnosis not present

## 2023-01-21 DIAGNOSIS — R6889 Other general symptoms and signs: Secondary | ICD-10-CM | POA: Diagnosis not present

## 2023-01-21 LAB — RENAL FUNCTION PANEL
Albumin: 2.1 g/dL — ABNORMAL LOW (ref 3.5–5.0)
Anion gap: 12 (ref 5–15)
BUN: 39 mg/dL — ABNORMAL HIGH (ref 8–23)
CO2: 27 mmol/L (ref 22–32)
Calcium: 9.7 mg/dL (ref 8.9–10.3)
Chloride: 95 mmol/L — ABNORMAL LOW (ref 98–111)
Creatinine, Ser: 3.03 mg/dL — ABNORMAL HIGH (ref 0.61–1.24)
GFR, Estimated: 20 mL/min — ABNORMAL LOW (ref >60)
Glucose, Bld: 112 mg/dL — ABNORMAL HIGH (ref 70–99)
Phosphorus: 3.7 mg/dL (ref 2.5–4.6)
Potassium: 4.2 mmol/L (ref 3.5–5.1)
Sodium: 134 mmol/L — ABNORMAL LOW (ref 135–145)

## 2023-01-21 LAB — GLUCOSE, CAPILLARY: Glucose-Capillary: 91 mg/dL (ref 70–99)

## 2023-01-21 MED ORDER — MIDODRINE HCL 10 MG PO TABS: 10.0000 mg | ORAL_TABLET | ORAL | Status: DC

## 2023-01-21 NOTE — Discharge Summary (Signed)
Physician Discharge Summary  [name] LTJ:[id number] DOB: May 20, 1946 DOA: 12/12/2022  PCP: Redmond School, MD  Admit date: 12/12/2022  Discharge date: 01/21/2023  Admitted From: Home.  Disposition:  [facility]  Recommendations for Outpatient Follow-up:  Follow up with PCP in 1-2 weeks Please obtain BMP/CBC in one week Follow-up with general surgery in 1 week. Please premedicate patient with pain medication before dialysis. Continue hemodialysis on Monday Wednesday and Friday basis. Continue Augmentin for 1 to 2 days.  Home Health:None Equipment/Devices:None  Discharge Condition: Stable CODE STATUS:Full code Diet recommendation:  Thin fluids  Brief Summary/ Hospital Course: This 77 year old male with past medical history of ESRD on hemodialysis Monday Wednesday Friday, HFrEF, diabetes mellitus type 2, hypertension who presented with nausea. Patient has known history of cholecystitis and underwent cholecystostomy tube placement. His tube had become dislodged and he underwent evaluation with surgery. He was noted to have recanalization of the bile duct therefore tube was not replaced. He came to hospital for elective cholecystectomy, He underwent preop evaluation with cardiology. His workup was notable for diffuse T wave inversions and prolonged QTc. Also was found to be significantly hypokalemic. He was started on electrolyte replacement. Nephrology was consulted for chronic hemodialysis during hospitalization. He was transferred to [facility] for Left heart cath and noted to have multivessel diffuse moderate CAD without obvious flow limiting lesion. He was cleared for surgery and is now post cholecystecomy on 12/28.  Hospitalization c/b presence of sacral decubitus ulcer.  S/p hydrotherapy.  Currently on antibiotics.  Patient has been tolerating regular diet with thin liquids. Patient is medically clear and being discharged to SNF for rehabilitation.  Discharge Diagnoses:   Principal Problem:   Chronic cholecystitis Active Problems:   ESRD (end stage renal disease) ([place])   Coronary artery disease involving native coronary artery of native heart without angina pectoris   Hypokalemia   Diabetes mellitus type 2 in obese (HCC)   Anemia, normocytic normochromic   Essential hypertension   Heart failure with improved ejection fraction (HFimpEF) (HCC)   HFrEF (heart failure with reduced ejection fraction) (HCC)   Nausea and vomiting   Pressure injury of skin of sacral region   Prolonged QT interval   Generalized weakness   Abnormal EKG   Preoperative cardiovascular examination   Protein-calorie malnutrition, severe   Malnutrition of moderate degree   Paroxysmal atrial fibrillation (HCC)  Stage IV Sacral Decubitus Ulcer: Wound culture growing E. Coli resistant to ampicillin/unasyn, E. faecalis resistant to vancomycin, pansensitive proteus. He completed course of hydrotherapy CT with subcutaneous fat straining with complex collection posterior to the sacrum on the left measuring 2.5x1.2cm concerning for abscess, no evidence of osteo E. Coli and proteus treated x 7 days with cefepime.   E. Faecalis treated with ampicillin completed course 1/15.   Sacral MRI could not be done due to the presence of the nerve stimulator per surgery, no abscess but packing in the place of ulcer. Continue oxycodone 5 mg every 4 hours as needed Continue local wound care as per wound care RN recommendation Patient was noted to be febrile on 1/22.  WBC noted to be higher. However fever appears to have resolved. Based on his previous cultures, Patient was started on Augmentin.  Complete Augmentin for 7 days, ( 2 more days )   Chronic systolic and diastolic CHF: Echo showed LVEF 50 to 55%. Previous LVEF was 35 to 40% in August 2023. He underwent LHC which showed multivessel diffuse moderate CAD without any acute or  obvious flow-limiting lesions. Continue medical management. Noted to  be on aspirin, statin, nitrates. Patient is stable from cardiac standpoint.   End-stage renal disease: Nephrology is following.  Continue dialysis as per schedule. He was able to sit for his dialysis session on Friday. He needs to be able to sit for the sessions before he can be accepted at an outpatient dialysis clinic. He was able to sit for his dialysis for the last 2-3 sessions.  May need premedication with pain meds for his sacral wounds before he goes for dialysis so that he is able to sit for the sessions.   Hypokalemia / Hyponatremia:  Replaced. Resolved.   Chronic cholecystitis: Now status post cholecystectomy.   Per general surgery; pneumoperitoneum is likely from lap chole. Stable. Resolved.   Constipation: Continue bowel regimen.   Anemia of chronic kidney disease: No evidence for overt bleeding.  Hemoglobin stable.   Diabetes mellitus type 2: HbA1c 4.4 (likely reflecting his recent failure to thrive, poor PO intake).   Functional Quadriplegia: Essentially bedbound for the past year (needs help toileting, turning in bed, dressing, etc).   Hypophosphatemia/hypomagnesemia: -Repleted.   Moderate Protein calorie malnutrition  Poor PO intake: -BMI 22.98 kg/m -had cortrak to supplement nutrition in setting of his decubitus ulcer, but this was d/c'd after about 1 week  Oral intake has significantly improved.  No need for PEG tube at this time.   Pressure Injury 11/06/22 Sacrum Mid Stage 4 - Full thickness tissue loss with exposed bone, tendon or muscle. (Active)  11/06/22 1315  Location: Sacrum  Location Orientation: Mid  Staging: Stage 4 - Full thickness tissue loss with exposed bone, tendon or muscle.  Wound Description (Comments):   Present on Admission: Yes       Discharge Instructions  Discharge Instructions     AMB referral to wound care center   Complete by: As directed    Call MD for:  difficulty breathing, headache or visual disturbances   Complete  by: As directed    Call MD for:  extreme fatigue   Complete by: As directed    Call MD for:  persistant dizziness or light-headedness   Complete by: As directed    Call MD for:  persistant nausea and vomiting   Complete by: As directed    Call MD for:  severe uncontrolled pain   Complete by: As directed    Call MD for:  temperature >100.4   Complete by: As directed    Diet general   Complete by: As directed    Discharge instructions   Complete by: As directed    Please review instructions on the discharge summary.  You were cared for by a hospitalist during your hospital stay. If you have any questions about your discharge medications or the care you received while you were in the hospital after you are discharged, you can call the unit and asked to speak with the hospitalist on call if the hospitalist that took care of you is not available. Once you are discharged, your primary care physician will handle any further medical issues. Please note that NO REFILLS for any discharge medications will be authorized once you are discharged, as it is imperative that you return to your primary care physician (or establish a relationship with a primary care physician if you do not have one) for your aftercare needs so that they can reassess your need for medications and monitor your lab values. If you do not have a primary care  physician, you can call [phone] for a physician referral.   Discharge wound care:   Complete by: As directed    Wound care: Twice daily      Comments: Line sacral wound bed with Silver hydrofiber Kellie Simmering [phone]) daily, fill remaining defect with fluffed dry gauze.  Top with dry dressing. Use medhoney.   Increase activity slowly   Complete by: As directed       Allergies as of 01/21/2023       Reactions   Metformin Nausea And Vomiting   Niacin    Niacin And Related Other (See Comments)   Reaction: flushing and burning side effects even with extended release         Medication List     STOP taking these medications    acarbose 50 MG tablet Commonly known as: PRECOSE   ascorbic acid 500 MG tablet Commonly known as: VITAMIN C   carvedilol 3.125 MG tablet Commonly known as: COREG   Dialyvite 800-Zinc 15 0.8 MG Tabs   docusate sodium 100 MG capsule Commonly known as: Colace   epoetin alfa 3000 UNIT/ML injection Commonly known as: EPOGEN   hydrALAZINE 25 MG tablet Commonly known as: APRESOLINE   insulin NPH Human 100 UNIT/ML injection Commonly known as: NovoLIN N   isosorbide mononitrate 30 MG 24 hr tablet Commonly known as: IMDUR   Januvia 25 MG tablet Generic drug: sitaGLIPtin   multivitamin with minerals tablet   pravastatin 40 MG tablet Commonly known as: PRAVACHOL   promethazine 25 MG suppository Commonly known as: PHENERGAN   senna 8.6 MG Tabs tablet Commonly known as: SENOKOT   traMADol 50 MG tablet Commonly known as: ULTRAM   Zinc 50 MG Tabs       TAKE these medications    acetaminophen 500 MG tablet Commonly known as: TYLENOL Take 2 tablets (1,000 mg total) by mouth every 6 (six) hours.   allopurinol 100 MG tablet Commonly known as: ZYLOPRIM Take 100 mg by mouth daily.   amoxicillin-clavulanate 500-125 MG tablet Commonly known as: AUGMENTIN Take 1 tablet by mouth daily at 6 PM for 7 days.   aspirin EC 81 MG tablet Take 1 tablet (81 mg total) by mouth daily.   atorvastatin 40 MG tablet Commonly known as: LIPITOR Take 1 tablet (40 mg total) by mouth daily.   calcitRIOL 0.25 MCG capsule Commonly known as: ROCALTROL Take 0.25 mcg by mouth every Sunday.   carboxymethylcellulose 0.5 % Soln Commonly known as: REFRESH PLUS Place 1 drop into both eyes daily as needed (dry eyes).   Darbepoetin Alfa 200 MCG/0.4ML Sosy injection Commonly known as: ARANESP Inject 0.4 mLs (200 mcg total) into the skin every Friday at 6 PM.   feeding supplement Liqd Take 237 mLs by mouth 3 (three) times daily with  meals.   ferrous sulfate 325 (65 FE) MG tablet Take 1 tablet (325 mg total) by mouth daily.   gabapentin 100 MG capsule Commonly known as: NEURONTIN Take 1 capsule (100 mg total) by mouth 3 (three) times daily. What changed:  medication strength how much to take when to take this   Gerhardt's butt cream Crea Apply 1 Application topically 2 (two) times daily.   isosorbide dinitrate 5 MG tablet Commonly known as: ISORDIL Take 1 tablet (5 mg total) by mouth 2 (two) times daily.   leptospermum manuka honey Pste paste Apply 1 Application topically daily.   meclizine 12.5 MG tablet Commonly known as: ANTIVERT Take 1 tablet (12.5 mg total) by  mouth 3 (three) times daily as needed for dizziness.   melatonin 5 MG Tabs Take 5 mg by mouth at bedtime.   midodrine 10 MG tablet Commonly known as: PROAMATINE Take 1 tablet (10 mg total) by mouth every Monday, Wednesday, and Friday with hemodialysis. Start taking on: January 22, 2023   multivitamin Tabs tablet Take 1 tablet by mouth at bedtime.   nitroGLYCERIN 0.4 MG SL tablet Commonly known as: NITROSTAT Place 1 tablet (0.4 mg total) under the tongue every 5 (five) minutes as needed for chest pain. PLACE ONE TABLET UNDER THE TONGUE EVERY 5 MINUTES FOR 3 DOSES AS NEEDED Strength: 0.4 mg   oxyCODONE 5 MG immediate release tablet Commonly known as: Oxy IR/ROXICODONE Take 0.5-1 tablets (2.5-5 mg total) by mouth every 4 (four) hours as needed for moderate pain.   polyethylene glycol 17 g packet Commonly known as: MIRALAX / GLYCOLAX Take 17 g by mouth 2 (two) times daily. What changed: when to take this   prochlorperazine 5 MG tablet Commonly known as: COMPAZINE Take 1 tablet (5 mg total) by mouth every 6 (six) hours as needed for nausea or vomiting.   Restasis 0.05 % ophthalmic emulsion Generic drug: cycloSPORINE Place 1 drop into both eyes 2 (two) times daily.   senna-docusate 8.6-50 MG tablet Commonly known as:  Senokot-S Take 2 tablets by mouth 2 (two) times daily.   simethicone 80 MG chewable tablet Commonly known as: MYLICON Chew 80 mg by mouth every 6 (six) hours as needed for flatulence.   True Metrix Blood Glucose Test test strip Generic drug: glucose blood USE TO TEST BLOOD SUGAR UP TO TWICE DAILY OR AS DIRECTED               Discharge Care Instructions  (From admission, onward)           Start     Ordered   01/16/23 0000  Discharge wound care:       Comments: Wound care: Twice daily      Comments: Line sacral wound bed with Silver hydrofiber Kellie Simmering [phone]) daily, fill remaining defect with fluffed dry gauze.  Top with dry dressing. Use medhoney.   01/16/23 1043            Contact information for follow-up providers     Maczis, Puja Gosai, PA-C. Go on 01/09/2023.   Specialty: General Surgery Why: Your appointment is 01/09/23 at 3:30 pm Arrive 84mn early to check in, fill out paperwork, Bring photo ID and insurance information Contact information: [phone]             Contact information for after-discharge care     [facility]   Service: Skilled Nursing Contact information: [address] [phone]                   Allergies  Allergen Reactions   Metformin Nausea And Vomiting   Niacin    Niacin And Related Other (See Comments)    Reaction: flushing and burning side effects even with extended release    Consultations: General Surgery   Procedures/Studies: DG Abd Portable 1V  Result Date: 12/27/2022 CLINICAL DATA:  Encounter for feeding tube placement EXAM: PORTABLE ABDOMEN - 1 VIEW COMPARISON:  Abdominal radiographs 08/07/2022. FINDINGS: Feeding tube courses below the diaphragm with the tip in the expected region of the stomach.  Nonobstructive bowel gas pattern. Spinal stimulator in  place. Lumbar fixation. IMPRESSION: Feeding tube courses below the diaphragm with the tip in the expected region of the stomach Electronically Signed   By: Margaretha Sheffield M.D.   On: 12/27/2022 15:58   CT ABDOMEN PELVIS WO CONTRAST  Addendum Date: 12/26/2022   ADDENDUM REPORT: 12/26/2022 23:38 ADDENDUM: Critical Value/emergent results were called by telephone at the time of interpretation on 12/26/2022 at 11:37 pm to provider Dr. Bridgett Larsson, who verbally acknowledged these results. Electronically Signed   By: Brett Fairy M.D.   On: 12/26/2022 23:38   Result Date: 12/26/2022 CLINICAL DATA:  Sepsis, sacral ulcer.  Concern for osteomyelitis. EXAM: CT ABDOMEN AND PELVIS WITHOUT CONTRAST TECHNIQUE: Multidetector CT imaging of the abdomen and pelvis was performed following the standard protocol without IV contrast. RADIATION DOSE REDUCTION: This exam was performed according to the departmental dose-optimization program which includes automated exposure control, adjustment of the mA and/or kV according to patient size and/or use of iterative reconstruction technique. COMPARISON:  11/04/2022. FINDINGS: Lower chest: The heart is normal in size and there is no pericardial effusion. Multi-vessel coronary artery calcifications are noted. The distal tip of a right central venous catheter terminates in the right atrium. Patchy atelectasis or infiltrate is present at the lung bases. Hepatobiliary: Scattered hypodensities are present in the liver which are unchanged from the prior exam. The gallbladder is surgically absent which is new from the previous exam. No biliary ductal dilatation. Pancreas: Pancreatic atrophy is noted. No pancreatic ductal dilatation or surrounding inflammatory changes. Spleen: Normal in size without focal abnormality. Adrenals/Urinary Tract: No adrenal nodule or mass. Renal cysts are present bilaterally. Renal calculi are noted bilaterally. No hydronephrosis. The bladder is unremarkable. Stomach/Bowel:  Stomach is within normal limits. Appendix is not seen. No evidence of bowel wall thickening, distention, or inflammatory changes. Free air is noted in the upper abdomen. Moderate amount of retained stool is present in the colon. Vascular/Lymphatic: Aortic atherosclerosis. No enlarged abdominal or pelvic lymph nodes. Reproductive: The prostate gland is enlarged. Other: No abdominopelvic ascites. There is a fat containing umbilical hernia is noted. A focus of air is noted in the anterior abdominal wall in the region of the umbilicus. Musculoskeletal: Degenerative changes in the thoracolumbar spine. A neurostimulator device is noted in the subcutaneous tissues of the right flank with leads terminating in the thoracic spinal canal. Lumbar spinal fusion hardware is present from L2-L5. There is fat stranding and increased density in the subcutaneous tissues overlying the distal sacrum and coccyx on the left. There is a complex collection containing air in the subcutaneous tissues posterior to the sacrum on the left measuring 2.5 x 1.2 cm. No obvious bony erosions or periosteal elevation in this region. IMPRESSION: 1. Small amount of free air in the upper abdomen with cholecystectomy changes, which may be due to recent surgery. Clinical correlation is recommended. 2. Subcutaneous fat stranding with complex collection posterior to the sacrum on the left measuring 2.5 x 1.2 cm, suspicious for abscess formation. No definite evidence of osteomyelitis. 3. Patchy airspace disease at the lung bases, possible atelectasis or infiltrate. 4. Moderate amount of retained stool in the colon suggesting constipation. 5. Aortic atherosclerosis. 6. Remaining incidental findings as described above. Electronically Signed: By: Brett Fairy M.D. On: 12/26/2022 23:30    Subjective: Patient was seen and examined at bedside.  Overnight events noted.   Patient report doing much better and wants to be discharged.  Patient is being discharged to  SNF for rehabilitation.  Discharge Exam: Vitals:   01/21/23 0418 01/21/23 0737  BP: 122/62 122/80  Pulse: 88 90  Resp: 18 18  Temp: 98.7 F (37.1 C) 97.8 F (36.6 C)  SpO2: 100% 100%   Vitals:   01/20/23 1945 01/20/23 2000 01/21/23 0418 01/21/23 0737  BP:  111/64 122/62 122/80  Pulse: 98 (!) 102 88 90  Resp:  '16 18 18  '$ Temp:  99.2 F (37.3 C) 98.7 F (37.1 C) 97.8 F (36.6 C)  TempSrc:  Oral Oral Oral  SpO2: 100% 99% 100% 100%  Weight:      Height:        General: Pt is alert, awake, not in acute distress Cardiovascular: RRR, S1/S2 +, no rubs, no gallops Respiratory: CTA bilaterally, no wheezing, no rhonchi Abdominal: Soft, NT, ND, bowel sounds + Extremities: no edema, no cyanosis    The results of significant diagnostics from this hospitalization (including imaging, microbiology, ancillary and laboratory) are listed below for reference.     Microbiology: No results found for this or any previous visit (from the past 240 hour(s)).   Labs: BNP (last 3 results) Recent Labs    08/08/22 0425 08/09/22 0511 08/10/22 0602  BNP 139.0* 170.0* 355.7*   Basic Metabolic Panel: Recent Labs  Lab 01/15/23 1551 01/17/23 0742 01/20/23 0942  NA 133* 134* 139  K 3.9 3.2* 4.6  CL 92* 95* 99  CO2 '28 28 25  '$ GLUCOSE 125* 128* 139*  BUN 20 49* 62*  CREATININE 2.25* 4.32* 5.03*  CALCIUM 9.5 10.0 10.4*  PHOS 2.9 4.6  --    Liver Function Tests: Recent Labs  Lab 01/15/23 1551 01/17/23 0742  ALBUMIN 2.1* 2.0*   No results for input(s): "LIPASE", "AMYLASE" in the last 168 hours. No results for input(s): "AMMONIA" in the last 168 hours. CBC: Recent Labs  Lab 01/15/23 0820 01/16/23 0544 01/17/23 0742  WBC 13.5* 11.7* 11.3*  HGB 8.2* 9.3* 8.0*  HCT 26.7* 28.7* 24.9*  MCV 103.1* 100.7* 100.4*  PLT 325 276 328   Cardiac Enzymes: No results for input(s): "CKTOTAL", "CKMB", "CKMBINDEX", "TROPONINI" in the last 168 hours. BNP: Invalid input(s):  "POCBNP" CBG: Recent Labs  Lab 01/19/23 1150 01/19/23 1657 01/20/23 0755 01/20/23 1229 01/21/23 0746  GLUCAP 128* 127* 167* 139* 91   D-Dimer No results for input(s): "DDIMER" in the last 72 hours. Hgb A1c No results for input(s): "HGBA1C" in the last 72 hours. Lipid Profile No results for input(s): "CHOL", "HDL", "LDLCALC", "TRIG", "CHOLHDL", "LDLDIRECT" in the last 72 hours. Thyroid function studies No results for input(s): "TSH", "T4TOTAL", "T3FREE", "THYROIDAB" in the last 72 hours.  Invalid input(s): "FREET3" Anemia work up No results for input(s): "VITAMINB12", "FOLATE", "FERRITIN", "TIBC", "IRON", "RETICCTPCT" in the last 72 hours. Urinalysis    Component Value Date/Time   COLORURINE AMBER (A) 11/04/2022 1408   APPEARANCEUR HAZY (A) 11/04/2022 1408   LABSPEC 1.017 11/04/2022 1408   PHURINE 5.0 11/04/2022 1408   GLUCOSEU NEGATIVE 11/04/2022 1408   HGBUR NEGATIVE 11/04/2022 1408   BILIRUBINUR NEGATIVE 11/04/2022 1408   KETONESUR 5 (A) 11/04/2022 1408   PROTEINUR 100 (A) 11/04/2022 1408   UROBILINOGEN 1.0 03/02/2015 1100   NITRITE NEGATIVE 11/04/2022 1408   LEUKOCYTESUR NEGATIVE 11/04/2022 1408   Sepsis Labs Recent Labs  Lab 01/15/23 0820 01/16/23 0544 01/17/23 0742  WBC 13.5* 11.7* 11.3*   Microbiology No results found for this or any previous visit (from the past 240 hour(s)).   Time coordinating discharge: Over  30 minutes  SIGNED:   Shawna Clamp, MD  [facility] 01/21/2023, 10:56 AM Pager   If 7PM-7AM, please contact night-coverage

## 2023-01-21 NOTE — Progress Notes (Signed)
Contacted [facility] to advise clinic of pt's d/c today to snf and that pt should resume care tomorrow.   Melven Sartorius Renal Navigator [phone]

## 2023-01-21 NOTE — Progress Notes (Signed)
RN attempted to give report to [facility]. Per RN at facility, Discharge summary needs to be completed and sent over to facility before RN at [facility] rehab can take report. Alison Murray RN made aware of situation.

## 2023-01-21 NOTE — Plan of Care (Signed)

## 2023-01-21 NOTE — TOC Progression Note (Signed)
Transition of Care [facility]) - Progression Note    Patient Details  Name: [name] MRN: [id number] Date of Birth: [birth year]  Transition [facility]) CM/SW [place], RN Phone Number: 01/21/2023, 9:59 AM  Clinical Narrative:    CM spoke with Destiny, CM at [facility] this morning and the facility has available bed ready for the patient - Room 154.  MD was notified to complete discharge orders and summary.  Clinicals will be uploaded in the hub once available.  [facility] rehabilitation facility is aware that patient will need hoyer lift availability at dialysis facility and SNF for transfers.  Bedside nursing - please call report to [facility] at [phone].  PTAR will be arranged and packet with clinicals available at the secretary's station.   Expected Discharge Plan: [facility] Barriers to Discharge: Continued Medical Work up  Expected Discharge Plan and Services In-house Referral: Clinical Social Work Discharge Planning Services: CM Consult Post Acute Care Choice: [place] Living arrangements for the past 2 months: Single Family Home Expected Discharge Date: 01/16/23                                     Social Determinants of Health ([place]) Interventions SDOH Screenings   Food Insecurity: No Food Insecurity (01/05/2023)  Housing: Low Risk  (12/17/2022)  Transportation Needs: No Transportation Needs (12/17/2022)  Utilities: Not At Risk (12/17/2022)  Tobacco Use: Medium Risk (12/20/2022)    Readmission Risk Interventions    01/20/2023   10:26 AM 09/16/2022   12:31 PM 08/02/2022    2:23 PM  Readmission Risk Prevention Plan  Transportation Screening Complete Complete Complete  Medication Review (RN Care Manager) Complete Complete Complete  PCP or Specialist appointment within 3-5 days of discharge Complete    HRI or Home Care Consult Complete Complete Complete  SW Recovery  Care/Counseling Consult Complete Complete Complete  Palliative Care Screening Complete Not Applicable Not Applicable  Skilled Nursing Facility Complete Not Applicable Complete

## 2023-01-21 NOTE — Progress Notes (Signed)
Occupational Therapy Treatment Patient Details Name: [name] MRN: [id number] DOB: 20-Nov-1946 Today's Date: 01/21/2023   History of present illness 77 yo male admitted to [facility] 12/21 for cholecystectomy which was canceled due to hypokalemia and prolonged Qtc. Transfer to [facility] 12/26 with cardiac cath performed same date. 12/28 lap chole. +sacral ulcer  PMHx: ESRD on HD MWF, HFrEF, DM, HTN, anxiety, HLD, Bil TKA, gout, GERD   OT comments  Patient seen at bed level and  continues to be max assist for rolling to address cleaning back side with aide assist for cleaning. Patient making progress with self feeding following setup and positioning in bed. Patient continues to require assistance with washing face due to difficulty using BUEs. Patient discharge recommendations continue to be appropriate for SNF to continue to address transfers, grooming, and self feeding. Acute OT to continue to follow.    Recommendations for follow up therapy are one component of a multi-disciplinary discharge planning process, led by the attending physician.  Recommendations may be updated based on patient status, additional functional criteria and insurance authorization.    Follow Up Recommendations  Skilled nursing-short term rehab (<3 hours/day)     Assistance Recommended at Discharge Frequent or constant Supervision/Assistance  Patient can return home with the following  Two people to help with walking and/or transfers;Two people to help with bathing/dressing/bathroom   Equipment Recommendations  Other (comment);Hospital bed (air mattress, hoyer lift)    Recommendations for Other Services      Precautions / Restrictions Precautions Precautions: Fall;Other (comment) Precaution Comments: sacral wound Restrictions Weight Bearing Restrictions: No       Mobility Bed Mobility Overal bed mobility: Needs Assistance Bed Mobility: Rolling Rolling: Max assist         General bed mobility comments:  rolled side to side for cleaning with max assist    Transfers                   General transfer comment: unable     Balance                                           ADL either performed or assessed with clinical judgement   ADL Overall ADL's : Needs assistance/impaired Eating/Feeding: Set up;Supervision/ safety;Bed level Eating/Feeding Details (indicate cue type and reason): setup provided with opening items and cutting up food with patient able to manage cup and utensils Grooming: Wash/dry hands;Wash/dry face;Minimal assistance;Bed level Grooming Details (indicate cue type and reason): min assist to complete                               General ADL Comments: focused on self feeding    Extremity/Trunk Assessment              Vision       Perception     Praxis      Cognition Arousal/Alertness: Awake/alert Behavior During Therapy: Flat affect Overall Cognitive Status: No family/caregiver present to determine baseline cognitive functioning Area of Impairment: Orientation, Attention, Memory, Following commands, Safety/judgement                 Orientation Level: Disoriented to, Time, Situation Current Attention Level: Sustained Memory: Decreased short-term memory Following Commands: Follows one step commands inconsistently, Follows one step commands with increased time Safety/Judgement: Decreased awareness of deficits, Decreased  awareness of safety Awareness: Intellectual Problem Solving: Slow processing, Difficulty sequencing, Decreased initiation, Requires verbal cues, Requires tactile cues General Comments: oriented to day and month, able to follow one step commands with increased time        Exercises      Shoulder Instructions       General Comments      Pertinent Vitals/ Pain       Pain Assessment Pain Assessment: Faces Faces Pain Scale: Hurts a little bit Pain Location: bottom Pain Descriptors /  Indicators: Discomfort, Grimacing Pain Intervention(s): Limited activity within patient's tolerance, Monitored during session, Repositioned  Home Living                                          Prior Functioning/Environment              Frequency  Min 2X/week        Progress Toward Goals  OT Goals(current goals can now be found in the care plan section)  Progress towards OT goals: Progressing toward goals  Acute Rehab OT Goals Patient Stated Goal: get better OT Goal Formulation: With patient Time For Goal Achievement: 01/26/23 Potential to Achieve Goals: Fair ADL Goals Pt Will Perform Eating: with modified independence;sitting;with adaptive utensils Pt Will Perform Grooming: sitting;with set-up Pt Will Perform Upper Body Bathing: with set-up;sitting Pt Will Perform Lower Body Dressing: with min assist Pt Will Transfer to Toilet: stand pivot transfer;with mod assist;bedside commode Pt Will Perform Toileting - Clothing Manipulation and hygiene: sitting/lateral leans;sit to/from stand;with mod assist Pt/caregiver will Perform Home Exercise Program: Increased ROM;Increased strength;Both right and left upper extremity;With Supervision;With minimal assist  Plan Discharge plan remains appropriate    Co-evaluation                 AM-PAC OT "6 Clicks" Daily Activity     Outcome Measure   Help from another person eating meals?: A Little Help from another person taking care of personal grooming?: A Little Help from another person toileting, which includes using toliet, bedpan, or urinal?: Total Help from another person bathing (including washing, rinsing, drying)?: Total Help from another person to put on and taking off regular upper body clothing?: A Lot Help from another person to put on and taking off regular lower body clothing?: Total 6 Click Score: 11    End of Session    OT Visit Diagnosis: Unsteadiness on feet (R26.81);Muscle weakness  (generalized) (M62.81);Pain   Activity Tolerance Patient tolerated treatment well   Patient Left in bed;with call bell/phone within reach   Nurse Communication Mobility status        Time: 0731-0800 OT Time Calculation (min): 29 min  Charges: OT General Charges $OT Visit: 1 Visit OT Treatments $Self Care/Home Management : 8-22 mins $Therapeutic Activity: 8-22 mins  Lodema Hong, [place]  Office [place] 01/21/2023, 9:28 AM

## 2023-01-21 NOTE — Progress Notes (Signed)
Patient is now discharged and on his way to his SNF. Vitals are stable and medication given before he left.

## 2023-01-21 NOTE — Progress Notes (Signed)
Report given to [facility] RN at [facility]

## 2023-01-21 NOTE — Progress Notes (Signed)
[name] Progress Note   Subjective:  Seen in room - no overnight events noted. For d/c today per notes. Did fine with HD yesterday.  Objective Vitals:   01/20/23 1945 01/20/23 2000 01/21/23 0418 01/21/23 0737  BP:  111/64 122/62 122/80  Pulse: 98 (!) 102 88 90  Resp:  '16 18 18  '$ Temp:  99.2 F (37.3 C) 98.7 F (37.1 C) 97.8 F (36.6 C)  TempSrc:  Oral Oral Oral  SpO2: 100% 99% 100% 100%  Weight:      Height:       Physical Exam General: Frail appearing man, NAD. Room air. Heart: RRR; no murmur Lungs: CTA anteriorly Abdomen: soft Extremities: no LE edema Dialysis Access: TDC in R chest  Additional Objective Labs: Basic Metabolic Panel: Recent Labs  Lab 01/15/23 1551 01/17/23 0742 01/20/23 0942  NA 133* 134* 139  K 3.9 3.2* 4.6  CL 92* 95* 99  CO2 '28 28 25  '$ GLUCOSE 125* 128* 139*  BUN 20 49* 62*  CREATININE 2.25* 4.32* 5.03*  CALCIUM 9.5 10.0 10.4*  PHOS 2.9 4.6  --    Liver Function Tests: Recent Labs  Lab 01/15/23 1551 01/17/23 0742  ALBUMIN 2.1* 2.0*   CBC: Recent Labs  Lab 01/15/23 0820 01/16/23 0544 01/17/23 0742  WBC 13.5* 11.7* 11.3*  HGB 8.2* 9.3* 8.0*  HCT 26.7* 28.7* 24.9*  MCV 103.1* 100.7* 100.4*  PLT 325 276 328   Medications:  sodium chloride Stopped (01/06/23 0425)    (feeding supplement) PROSource Plus  30 mL Oral BID BM   acetaminophen  650 mg Oral TID   allopurinol  100 mg Oral Daily   amoxicillin-clavulanate  1 tablet Oral q1800   aspirin EC  81 mg Oral Daily   atorvastatin  40 mg Oral Daily   Chlorhexidine Gluconate Cloth  6 each Topical Q0600   cycloSPORINE  1 drop Both Eyes BID   darbepoetin (ARANESP) injection - DIALYSIS  200 mcg Subcutaneous Q Fri-1800   feeding supplement  237 mL Oral TID WC   gabapentin  100 mg Oral TID   Gerhardt's butt cream   Topical BID   heparin  5,000 Units Subcutaneous Q8H   isosorbide dinitrate  5 mg Oral BID   melatonin  4.5 mg Oral QHS   midodrine  10 mg Oral Q  M,W,F-HD   multivitamin  1 tablet Oral QHS   polyethylene glycol  17 g Oral BID   senna-docusate  2 tablet Oral BID   sodium chloride flush  3 mL Intravenous Q12H   sodium phosphate  1 enema Rectal Once    Dialysis Orders: [facility] MWF 4h  400/800  84.5kg  2/2.5 bath   [facility] TDC - Heparin 4000 units IV  + 3000 units IV mid run - last OP HD 12/20, post wt 85.9kg - last IP hD 12/24, post wt 85kg - mircera 225 mcg IV q2, last 12/18, due 1/02 - no vdra  Assessment/Plan: ESRD - on HD MWF - next HD tomorrow, likely outpatient. He reports history of vertigo and how it worsens on HD. Dialyzing in chair as tolerated. Ongoing pain, now giving pain meds pre HD to see how he tolerates and simulate outpatient setting.  Would benefit from having designated cushion to use during dialysis to help with off loading pressure on wound.  Hypokalemia: Using higher K bath with HD. Sacral decubitus ulcer/Stage IV - Per primary. Completed hydrotherapy/course of antibiotics. No surgical intervention needed. Surgery signed off.  Continue wound care.  Chronic cholecystitis - w/ nausea, malnutrition, FTT.  S/p laparoscopic cholecystectomy. Improving. Chronic HFrEF - LVEF up to 50-55% here, diffuse CAD. Volume managed and optimized with HD.  HTN/ volume - BP remain soft/stable. On midodrine 10 mg PO TIW.  No volume excess. UF as tolerated. Keep SBP >100. Under EDW if wts are correct. Lower EDW on discharge.  Anemia -Last Hgb 8.0 S/p 1 unit PRBC 12/31/2022. Aranesp 200 q Friday. Tsat low, ferritin high.  Hold IV iron for now. Will repeat Iron panel with HD 01/20/2023. May need to override Ferritin and view as a marker of inflammation and load with Fe.  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 No binder or VDRA currently. On regular diet because of FTT/nutrition DM2 - per pmd   Nutrition - Albumin low/FTT. On regular diet + protein supp per RD.  GOC- Palliative Care was consulted --Full code/full scope of care.  Dispo - Plan  for SNF - hopefully d/c today.  [name] Penton, PA-C 01/21/2023, 10:33 AM  [employer]

## 2023-01-22 ENCOUNTER — Inpatient Hospital Stay (HOSPITAL_COMMUNITY)
Admission: EM | Admit: 2023-01-22 | Discharge: 2023-02-08 | DRG: 853 | Disposition: A | Discharge status: Skilled Nursing Facility | Payer: Medicare Other | Source: Other Acute Inpatient Hospital | Attending: Internal Medicine | Admitting: Internal Medicine

## 2023-01-22 ENCOUNTER — Emergency Department (HOSPITAL_COMMUNITY): Payer: Medicare Other

## 2023-01-22 ENCOUNTER — Encounter (HOSPITAL_COMMUNITY): Payer: Self-pay | Admitting: Emergency Medicine

## 2023-01-22 ENCOUNTER — Other Ambulatory Visit: Payer: Self-pay

## 2023-01-22 DIAGNOSIS — M4628 Osteomyelitis of vertebra, sacral and sacrococcygeal region: Secondary | ICD-10-CM | POA: Diagnosis not present

## 2023-01-22 DIAGNOSIS — Z96653 Presence of artificial knee joint, bilateral: Secondary | ICD-10-CM | POA: Diagnosis present

## 2023-01-22 DIAGNOSIS — Z1152 Encounter for screening for COVID-19: Secondary | ICD-10-CM | POA: Diagnosis not present

## 2023-01-22 DIAGNOSIS — B9620 Unspecified Escherichia coli [E. coli] as the cause of diseases classified elsewhere: Secondary | ICD-10-CM | POA: Diagnosis present

## 2023-01-22 DIAGNOSIS — I82611 Acute embolism and thrombosis of superficial veins of right upper extremity: Secondary | ICD-10-CM | POA: Diagnosis present

## 2023-01-22 DIAGNOSIS — Z9049 Acquired absence of other specified parts of digestive tract: Secondary | ICD-10-CM

## 2023-01-22 DIAGNOSIS — W19XXXA Unspecified fall, initial encounter: Secondary | ICD-10-CM | POA: Diagnosis present

## 2023-01-22 DIAGNOSIS — T8249XA Other complication of vascular dialysis catheter, initial encounter: Secondary | ICD-10-CM | POA: Diagnosis not present

## 2023-01-22 DIAGNOSIS — E875 Hyperkalemia: Secondary | ICD-10-CM

## 2023-01-22 DIAGNOSIS — N2581 Secondary hyperparathyroidism of renal origin: Secondary | ICD-10-CM | POA: Diagnosis not present

## 2023-01-22 DIAGNOSIS — I472 Ventricular tachycardia, unspecified: Secondary | ICD-10-CM | POA: Diagnosis not present

## 2023-01-22 DIAGNOSIS — Z833 Family history of diabetes mellitus: Secondary | ICD-10-CM

## 2023-01-22 DIAGNOSIS — K6289 Other specified diseases of anus and rectum: Secondary | ICD-10-CM | POA: Diagnosis not present

## 2023-01-22 DIAGNOSIS — Z87891 Personal history of nicotine dependence: Secondary | ICD-10-CM

## 2023-01-22 DIAGNOSIS — E1169 Type 2 diabetes mellitus with other specified complication: Secondary | ICD-10-CM | POA: Diagnosis not present

## 2023-01-22 DIAGNOSIS — J9811 Atelectasis: Secondary | ICD-10-CM | POA: Diagnosis not present

## 2023-01-22 DIAGNOSIS — N400 Enlarged prostate without lower urinary tract symptoms: Secondary | ICD-10-CM | POA: Diagnosis present

## 2023-01-22 DIAGNOSIS — L89159 Pressure ulcer of sacral region, unspecified stage: Secondary | ICD-10-CM | POA: Diagnosis not present

## 2023-01-22 DIAGNOSIS — E43 Unspecified severe protein-calorie malnutrition: Secondary | ICD-10-CM | POA: Diagnosis not present

## 2023-01-22 DIAGNOSIS — E1142 Type 2 diabetes mellitus with diabetic polyneuropathy: Secondary | ICD-10-CM | POA: Diagnosis present

## 2023-01-22 DIAGNOSIS — Z992 Dependence on renal dialysis: Secondary | ICD-10-CM | POA: Diagnosis not present

## 2023-01-22 DIAGNOSIS — D62 Acute posthemorrhagic anemia: Secondary | ICD-10-CM | POA: Diagnosis present

## 2023-01-22 DIAGNOSIS — I959 Hypotension, unspecified: Secondary | ICD-10-CM | POA: Diagnosis not present

## 2023-01-22 DIAGNOSIS — D509 Iron deficiency anemia, unspecified: Secondary | ICD-10-CM | POA: Diagnosis not present

## 2023-01-22 DIAGNOSIS — M6281 Muscle weakness (generalized): Secondary | ICD-10-CM | POA: Diagnosis not present

## 2023-01-22 DIAGNOSIS — E1122 Type 2 diabetes mellitus with diabetic chronic kidney disease: Secondary | ICD-10-CM | POA: Diagnosis present

## 2023-01-22 DIAGNOSIS — Z8249 Family history of ischemic heart disease and other diseases of the circulatory system: Secondary | ICD-10-CM

## 2023-01-22 DIAGNOSIS — A419 Sepsis, unspecified organism: Secondary | ICD-10-CM | POA: Diagnosis not present

## 2023-01-22 DIAGNOSIS — N281 Cyst of kidney, acquired: Secondary | ICD-10-CM | POA: Diagnosis not present

## 2023-01-22 DIAGNOSIS — T827XXA Infection and inflammatory reaction due to other cardiac and vascular devices, implants and grafts, initial encounter: Secondary | ICD-10-CM | POA: Diagnosis not present

## 2023-01-22 DIAGNOSIS — K7689 Other specified diseases of liver: Secondary | ICD-10-CM | POA: Diagnosis not present

## 2023-01-22 DIAGNOSIS — M869 Osteomyelitis, unspecified: Secondary | ICD-10-CM | POA: Diagnosis not present

## 2023-01-22 DIAGNOSIS — R6521 Severe sepsis with septic shock: Secondary | ICD-10-CM | POA: Diagnosis not present

## 2023-01-22 DIAGNOSIS — Z743 Need for continuous supervision: Secondary | ICD-10-CM | POA: Diagnosis not present

## 2023-01-22 DIAGNOSIS — L89154 Pressure ulcer of sacral region, stage 4: Secondary | ICD-10-CM | POA: Diagnosis present

## 2023-01-22 DIAGNOSIS — Z7401 Bed confinement status: Secondary | ICD-10-CM | POA: Diagnosis not present

## 2023-01-22 DIAGNOSIS — R404 Transient alteration of awareness: Secondary | ICD-10-CM | POA: Diagnosis not present

## 2023-01-22 DIAGNOSIS — I499 Cardiac arrhythmia, unspecified: Secondary | ICD-10-CM | POA: Diagnosis not present

## 2023-01-22 DIAGNOSIS — R6889 Other general symptoms and signs: Secondary | ICD-10-CM | POA: Diagnosis not present

## 2023-01-22 DIAGNOSIS — I429 Cardiomyopathy, unspecified: Secondary | ICD-10-CM | POA: Diagnosis not present

## 2023-01-22 DIAGNOSIS — Z79899 Other long term (current) drug therapy: Secondary | ICD-10-CM

## 2023-01-22 DIAGNOSIS — D75838 Other thrombocytosis: Secondary | ICD-10-CM | POA: Diagnosis present

## 2023-01-22 DIAGNOSIS — R64 Cachexia: Secondary | ICD-10-CM | POA: Diagnosis present

## 2023-01-22 DIAGNOSIS — E669 Obesity, unspecified: Secondary | ICD-10-CM | POA: Diagnosis present

## 2023-01-22 DIAGNOSIS — Z8719 Personal history of other diseases of the digestive system: Secondary | ICD-10-CM | POA: Diagnosis not present

## 2023-01-22 DIAGNOSIS — I252 Old myocardial infarction: Secondary | ICD-10-CM

## 2023-01-22 DIAGNOSIS — N529 Male erectile dysfunction, unspecified: Secondary | ICD-10-CM | POA: Diagnosis present

## 2023-01-22 DIAGNOSIS — R2689 Other abnormalities of gait and mobility: Secondary | ICD-10-CM | POA: Diagnosis not present

## 2023-01-22 DIAGNOSIS — I132 Hypertensive heart and chronic kidney disease with heart failure and with stage 5 chronic kidney disease, or end stage renal disease: Secondary | ICD-10-CM | POA: Diagnosis not present

## 2023-01-22 DIAGNOSIS — R4182 Altered mental status, unspecified: Secondary | ICD-10-CM | POA: Diagnosis not present

## 2023-01-22 DIAGNOSIS — B964 Proteus (mirabilis) (morganii) as the cause of diseases classified elsewhere: Secondary | ICD-10-CM | POA: Diagnosis present

## 2023-01-22 DIAGNOSIS — N250 Renal osteodystrophy: Secondary | ICD-10-CM | POA: Diagnosis present

## 2023-01-22 DIAGNOSIS — Z955 Presence of coronary angioplasty implant and graft: Secondary | ICD-10-CM

## 2023-01-22 DIAGNOSIS — E440 Moderate protein-calorie malnutrition: Secondary | ICD-10-CM | POA: Diagnosis present

## 2023-01-22 DIAGNOSIS — R532 Functional quadriplegia: Secondary | ICD-10-CM | POA: Diagnosis present

## 2023-01-22 DIAGNOSIS — N186 End stage renal disease: Secondary | ICD-10-CM | POA: Diagnosis not present

## 2023-01-22 DIAGNOSIS — Z7982 Long term (current) use of aspirin: Secondary | ICD-10-CM

## 2023-01-22 DIAGNOSIS — I480 Paroxysmal atrial fibrillation: Secondary | ICD-10-CM | POA: Diagnosis present

## 2023-01-22 DIAGNOSIS — Z515 Encounter for palliative care: Secondary | ICD-10-CM

## 2023-01-22 DIAGNOSIS — K219 Gastro-esophageal reflux disease without esophagitis: Secondary | ICD-10-CM | POA: Diagnosis present

## 2023-01-22 DIAGNOSIS — Z888 Allergy status to other drugs, medicaments and biological substances status: Secondary | ICD-10-CM

## 2023-01-22 DIAGNOSIS — Z7189 Other specified counseling: Secondary | ICD-10-CM | POA: Diagnosis not present

## 2023-01-22 DIAGNOSIS — G9341 Metabolic encephalopathy: Secondary | ICD-10-CM | POA: Diagnosis not present

## 2023-01-22 DIAGNOSIS — I672 Cerebral atherosclerosis: Secondary | ICD-10-CM | POA: Diagnosis not present

## 2023-01-22 DIAGNOSIS — M109 Gout, unspecified: Secondary | ICD-10-CM | POA: Diagnosis present

## 2023-01-22 DIAGNOSIS — A4151 Sepsis due to Escherichia coli [E. coli]: Principal | ICD-10-CM | POA: Diagnosis present

## 2023-01-22 DIAGNOSIS — Z4901 Encounter for fitting and adjustment of extracorporeal dialysis catheter: Secondary | ICD-10-CM | POA: Diagnosis not present

## 2023-01-22 DIAGNOSIS — D539 Nutritional anemia, unspecified: Secondary | ICD-10-CM | POA: Diagnosis present

## 2023-01-22 DIAGNOSIS — I441 Atrioventricular block, second degree: Secondary | ICD-10-CM | POA: Diagnosis present

## 2023-01-22 DIAGNOSIS — S31000D Unspecified open wound of lower back and pelvis without penetration into retroperitoneum, subsequent encounter: Secondary | ICD-10-CM | POA: Diagnosis not present

## 2023-01-22 DIAGNOSIS — I2510 Atherosclerotic heart disease of native coronary artery without angina pectoris: Secondary | ICD-10-CM | POA: Diagnosis present

## 2023-01-22 DIAGNOSIS — I5042 Chronic combined systolic (congestive) and diastolic (congestive) heart failure: Secondary | ICD-10-CM | POA: Diagnosis not present

## 2023-01-22 DIAGNOSIS — R609 Edema, unspecified: Secondary | ICD-10-CM | POA: Diagnosis not present

## 2023-01-22 DIAGNOSIS — S2241XD Multiple fractures of ribs, right side, subsequent encounter for fracture with routine healing: Secondary | ICD-10-CM | POA: Diagnosis not present

## 2023-01-22 DIAGNOSIS — I5022 Chronic systolic (congestive) heart failure: Secondary | ICD-10-CM | POA: Diagnosis present

## 2023-01-22 DIAGNOSIS — D631 Anemia in chronic kidney disease: Secondary | ICD-10-CM | POA: Diagnosis present

## 2023-01-22 DIAGNOSIS — R531 Weakness: Secondary | ICD-10-CM | POA: Diagnosis not present

## 2023-01-22 DIAGNOSIS — Z1621 Resistance to vancomycin: Secondary | ICD-10-CM | POA: Diagnosis not present

## 2023-01-22 DIAGNOSIS — R7881 Bacteremia: Secondary | ICD-10-CM | POA: Diagnosis present

## 2023-01-22 DIAGNOSIS — Z48815 Encounter for surgical aftercare following surgery on the digestive system: Secondary | ICD-10-CM | POA: Diagnosis not present

## 2023-01-22 DIAGNOSIS — I120 Hypertensive chronic kidney disease with stage 5 chronic kidney disease or end stage renal disease: Secondary | ICD-10-CM | POA: Diagnosis not present

## 2023-01-22 DIAGNOSIS — E7800 Pure hypercholesterolemia, unspecified: Secondary | ICD-10-CM | POA: Diagnosis present

## 2023-01-22 DIAGNOSIS — R1310 Dysphagia, unspecified: Secondary | ICD-10-CM | POA: Diagnosis not present

## 2023-01-22 DIAGNOSIS — R627 Adult failure to thrive: Secondary | ICD-10-CM | POA: Diagnosis present

## 2023-01-22 DIAGNOSIS — D689 Coagulation defect, unspecified: Secondary | ICD-10-CM | POA: Diagnosis not present

## 2023-01-22 DIAGNOSIS — T80211A Bloodstream infection due to central venous catheter, initial encounter: Secondary | ICD-10-CM | POA: Diagnosis not present

## 2023-01-22 DIAGNOSIS — J984 Other disorders of lung: Secondary | ICD-10-CM | POA: Diagnosis not present

## 2023-01-22 DIAGNOSIS — Z6822 Body mass index (BMI) 22.0-22.9, adult: Secondary | ICD-10-CM

## 2023-01-22 DIAGNOSIS — R400 Somnolence: Secondary | ICD-10-CM | POA: Diagnosis not present

## 2023-01-22 LAB — CBC WITH DIFFERENTIAL/PLATELET
Abs Immature Granulocytes: 0.18 10*3/uL — ABNORMAL HIGH (ref 0.00–0.07)
Basophils Absolute: 0.1 10*3/uL (ref 0.0–0.1)
Basophils Relative: 0 %
Eosinophils Absolute: 0 10*3/uL (ref 0.0–0.5)
Eosinophils Relative: 0 %
HCT: 30.9 % — ABNORMAL LOW (ref 39.0–52.0)
Hemoglobin: 9.4 g/dL — ABNORMAL LOW (ref 13.0–17.0)
Immature Granulocytes: 1 %
Lymphocytes Relative: 7 %
Lymphs Abs: 1.3 10*3/uL (ref 0.7–4.0)
MCH: 31.6 pg (ref 26.0–34.0)
MCHC: 30.4 g/dL (ref 30.0–36.0)
MCV: 104 fL — ABNORMAL HIGH (ref 80.0–100.0)
Monocytes Absolute: 1.7 10*3/uL — ABNORMAL HIGH (ref 0.1–1.0)
Monocytes Relative: 9 %
Neutro Abs: 16.2 10*3/uL — ABNORMAL HIGH (ref 1.7–7.7)
Neutrophils Relative %: 83 %
Platelets: 488 10*3/uL — ABNORMAL HIGH (ref 150–400)
RBC: 2.97 MIL/uL — ABNORMAL LOW (ref 4.22–5.81)
RDW: 18.4 % — ABNORMAL HIGH (ref 11.5–15.5)
WBC: 19.5 10*3/uL — ABNORMAL HIGH (ref 4.0–10.5)
nRBC: 0.2 % (ref 0.0–0.2)

## 2023-01-22 LAB — URINALYSIS, ROUTINE W REFLEX MICROSCOPIC
Bacteria, UA: NONE SEEN
Bilirubin Urine: NEGATIVE
Glucose, UA: NEGATIVE mg/dL
Hgb urine dipstick: NEGATIVE
Ketones, ur: NEGATIVE mg/dL
Leukocytes,Ua: NEGATIVE
Nitrite: NEGATIVE
Protein, ur: 100 mg/dL — AB
Specific Gravity, Urine: 1.018 (ref 1.005–1.030)
pH: 6 (ref 5.0–8.0)

## 2023-01-22 LAB — COMPREHENSIVE METABOLIC PANEL
ALT: 16 U/L (ref 0–44)
AST: 45 U/L — ABNORMAL HIGH (ref 15–41)
Albumin: 2.2 g/dL — ABNORMAL LOW (ref 3.5–5.0)
Alkaline Phosphatase: 70 U/L (ref 38–126)
Anion gap: 18 — ABNORMAL HIGH (ref 5–15)
BUN: 18 mg/dL (ref 8–23)
CO2: 22 mmol/L (ref 22–32)
Calcium: 9.2 mg/dL (ref 8.9–10.3)
Chloride: 98 mmol/L (ref 98–111)
Creatinine, Ser: 1.96 mg/dL — ABNORMAL HIGH (ref 0.61–1.24)
GFR, Estimated: 35 mL/min — ABNORMAL LOW (ref >60)
Glucose, Bld: 129 mg/dL — ABNORMAL HIGH (ref 70–99)
Potassium: 4.7 mmol/L (ref 3.5–5.1)
Sodium: 138 mmol/L (ref 135–145)
Total Bilirubin: 0.8 mg/dL (ref 0.3–1.2)
Total Protein: 7.7 g/dL (ref 6.5–8.1)

## 2023-01-22 LAB — GLUCOSE, RANDOM: Glucose, Bld: 179 mg/dL — ABNORMAL HIGH (ref 70–99)

## 2023-01-22 LAB — CBG MONITORING, ED
Glucose-Capillary: 108 mg/dL — ABNORMAL HIGH (ref 70–99)
Glucose-Capillary: 129 mg/dL — ABNORMAL HIGH (ref 70–99)
Glucose-Capillary: 95 mg/dL (ref 70–99)

## 2023-01-22 LAB — I-STAT CHEM 8, ED
BUN: 23 mg/dL (ref 8–23)
Calcium, Ion: 1.05 mmol/L — ABNORMAL LOW (ref 1.15–1.40)
Chloride: 102 mmol/L (ref 98–111)
Creatinine, Ser: 2 mg/dL — ABNORMAL HIGH (ref 0.61–1.24)
Glucose, Bld: 128 mg/dL — ABNORMAL HIGH (ref 70–99)
HCT: 31 % — ABNORMAL LOW (ref 39.0–52.0)
Hemoglobin: 10.5 g/dL — ABNORMAL LOW (ref 13.0–17.0)
Potassium: 6.6 mmol/L (ref 3.5–5.1)
Sodium: 135 mmol/L (ref 135–145)
TCO2: 25 mmol/L (ref 22–32)

## 2023-01-22 LAB — LACTIC ACID, PLASMA
Lactic Acid, Venous: 5.5 mmol/L (ref 0.5–1.9)
Lactic Acid, Venous: 6.4 mmol/L (ref 0.5–1.9)

## 2023-01-22 LAB — APTT: aPTT: 24 seconds (ref 24–36)

## 2023-01-22 LAB — RESP PANEL BY RT-PCR (RSV, FLU A&B, COVID)  RVPGX2
Influenza A by PCR: NEGATIVE
Influenza B by PCR: NEGATIVE
Resp Syncytial Virus by PCR: NEGATIVE
SARS Coronavirus 2 by RT PCR: NEGATIVE

## 2023-01-22 LAB — PROTIME-INR
INR: 1.3 — ABNORMAL HIGH (ref 0.8–1.2)
Prothrombin Time: 15.7 seconds — ABNORMAL HIGH (ref 11.4–15.2)

## 2023-01-22 MED ORDER — CALCIUM GLUCONATE-NACL 1-0.675 GM/50ML-% IV SOLN
1.0000 g | Freq: Once | INTRAVENOUS | Status: AC
  Administered 2023-01-22: 1000 mg via INTRAVENOUS
  Filled 2023-01-22: qty 50

## 2023-01-22 MED ORDER — VANCOMYCIN HCL IN DEXTROSE 1-5 GM/200ML-% IV SOLN
1000.0000 mg | Freq: Once | INTRAVENOUS | Status: AC
  Administered 2023-01-22: 1000 mg via INTRAVENOUS
  Filled 2023-01-22: qty 200

## 2023-01-22 MED ORDER — FENTANYL CITRATE PF 50 MCG/ML IJ SOSY
25.0000 ug | PREFILLED_SYRINGE | Freq: Once | INTRAMUSCULAR | Status: AC
  Administered 2023-01-22: 25 ug via INTRAVENOUS
  Filled 2023-01-22: qty 1

## 2023-01-22 MED ORDER — LACTATED RINGERS IV BOLUS
500.0000 mL | Freq: Once | INTRAVENOUS | Status: AC
  Administered 2023-01-22: 500 mL via INTRAVENOUS

## 2023-01-22 MED ORDER — DEXTROSE 50 % IV SOLN
1.0000 | Freq: Once | INTRAVENOUS | Status: AC
  Administered 2023-01-22: 50 mL via INTRAVENOUS
  Filled 2023-01-22: qty 50

## 2023-01-22 MED ORDER — SODIUM CHLORIDE 0.9 % IV SOLN
2.0000 g | Freq: Once | INTRAVENOUS | Status: AC
  Administered 2023-01-22: 2 g via INTRAVENOUS
  Filled 2023-01-22: qty 12.5

## 2023-01-22 MED ORDER — SODIUM CHLORIDE 0.9 % IV SOLN: 2.0000 g | INTRAVENOUS | Status: DC

## 2023-01-22 MED ORDER — LACTATED RINGERS IV BOLUS (SEPSIS)
1000.0000 mL | Freq: Once | INTRAVENOUS | Status: AC
  Administered 2023-01-22: 1000 mL via INTRAVENOUS

## 2023-01-22 MED ORDER — INSULIN ASPART 100 UNIT/ML IV SOLN
5.0000 [IU] | Freq: Once | INTRAVENOUS | Status: AC
  Administered 2023-01-22: 5 [IU] via INTRAVENOUS

## 2023-01-22 MED ORDER — LACTATED RINGERS IV SOLN: INTRAVENOUS | Status: AC

## 2023-01-22 MED ORDER — CHLORHEXIDINE GLUCONATE CLOTH 2 % EX PADS
6.0000 | MEDICATED_PAD | Freq: Every day | CUTANEOUS | Status: DC
  Administered 2023-01-22 – 2023-01-29 (×7): 6 via TOPICAL

## 2023-01-22 MED ORDER — METRONIDAZOLE 500 MG/100ML IV SOLN
500.0000 mg | Freq: Once | INTRAVENOUS | Status: AC
  Administered 2023-01-22: 500 mg via INTRAVENOUS
  Filled 2023-01-22: qty 100

## 2023-01-22 MED ORDER — VANCOMYCIN HCL IN DEXTROSE 1-5 GM/200ML-% IV SOLN: 1000.0000 mg | INTRAVENOUS | Status: DC

## 2023-01-22 MED ORDER — NOREPINEPHRINE 4 MG/250ML-% IV SOLN
0.0000 ug/min | INTRAVENOUS | Status: DC
  Administered 2023-01-22 – 2023-01-23 (×2): 2 ug/min via INTRAVENOUS
  Administered 2023-01-23: 3 ug/min via INTRAVENOUS
  Filled 2023-01-22 (×2): qty 250

## 2023-01-22 MED ORDER — ACETAMINOPHEN 10 MG/ML IV SOLN
1000.0000 mg | Freq: Once | INTRAVENOUS | Status: AC
  Administered 2023-01-22: 1000 mg via INTRAVENOUS
  Filled 2023-01-22: qty 100

## 2023-01-22 MED ORDER — LACTATED RINGERS IV BOLUS (SEPSIS): 1000.0000 mL | Freq: Once | INTRAVENOUS | Status: DC

## 2023-01-22 NOTE — ED Notes (Signed)
Pt is a hard stick 

## 2023-01-22 NOTE — ED Provider Notes (Addendum)
[facility] Provider Note   CSN: [id number] Arrival date & time: 01/22/23  1704     History  Chief Complaint  Patient presents with   Altered Mental Status    [name] is a 77 y.o. male.  Patient brought in by EMS from dialysis.  Patient is from a [place] home.  Patient has end-stage renal disease on dialysis.  Has a temporary catheter right upper chest area.  Patient does pose the altered mental status responding to sternal rub.  But after he got here he did start to talk.  EMS reported full body shaking.  We did not witness any of that.  Tachypneic tachycardic feels warm.  Eyes open.  Patient's heart rate on arrival around 155.  Respiratory rate 32 oxygen sats 96% on room air.  Blood pressure 102/67.  First temp 100.2 patient recently at the in December timeframe had a laparoscopic cholecystectomy.  Past medical history significant for end-stage renal disease on dialysis diabetes hypertension coronary artery disease non-STEMI in 2013 has stents.  Known to have cardiomyopathy ejection fractions were 50 to 55% in 2010 and 2023 they were 35 to 40%.  Known to have paroxysmal atrial fibrillation.  Past surgical history sniffing.  Insertion of dialysis catheter August 2023 IR per cholecystectomy in August 2023.  Cholecystectomy was done December 2023.  Heart cath leading into that in December 2023.  Patient is a former smoker quit 1994       Home Medications Prior to Admission medications   Medication Sig Start Date End Date Taking? Authorizing Provider  acetaminophen (TYLENOL) 500 MG tablet Take 2 tablets (1,000 mg total) by mouth every 6 (six) hours. 08/10/22   Shahmehdi, Valeria Batman, MD  allopurinol (ZYLOPRIM) 100 MG tablet Take 100 mg by mouth daily. 08/17/20   [provider]  amoxicillin-clavulanate (AUGMENTIN) 500-125 MG tablet Take 1 tablet by mouth daily at 6 PM for 7 days. 01/16/23 01/23/23  Bonnielee Haff, MD  aspirin EC 81  MG tablet Take 1 tablet (81 mg total) by mouth daily. 09/14/19   Herminio Commons, MD  atorvastatin (LIPITOR) 40 MG tablet Take 1 tablet (40 mg total) by mouth daily. 01/10/23   Bonnielee Haff, MD  calcitRIOL (ROCALTROL) 0.25 MCG capsule Take 0.25 mcg by mouth every Sunday.  12/13/16   [provider]  carboxymethylcellulose (REFRESH PLUS) 0.5 % SOLN Place 1 drop into both eyes daily as needed (dry eyes).    [provider]  Darbepoetin Alfa (ARANESP) 200 MCG/0.4ML SOSY injection Inject 0.4 mLs (200 mcg total) into the skin every Friday at 6 PM. 01/10/23   Bonnielee Haff, MD  feeding supplement (ENSURE ENLIVE / ENSURE PLUS) LIQD Take 237 mLs by mouth 3 (three) times daily with meals. 01/09/23   Bonnielee Haff, MD  ferrous sulfate 325 (65 FE) MG tablet Take 1 tablet (325 mg total) by mouth daily. 09/16/22 09/16/23  Manuella Ghazi, Pratik D, DO  gabapentin (NEURONTIN) 100 MG capsule Take 1 capsule (100 mg total) by mouth 3 (three) times daily. 01/09/23   Bonnielee Haff, MD  isosorbide dinitrate (ISORDIL) 5 MG tablet Take 1 tablet (5 mg total) by mouth 2 (two) times daily. 01/09/23   Bonnielee Haff, MD  leptospermum manuka honey (MEDIHONEY) PSTE paste Apply 1 Application topically daily. 01/10/23   Bonnielee Haff, MD  meclizine (ANTIVERT) 12.5 MG tablet Take 1 tablet (12.5 mg total) by mouth 3 (three) times daily as needed for dizziness. 09/16/22  Manuella Ghazi, Pratik D, DO  melatonin 5 MG TABS Take 5 mg by mouth at bedtime.    [provider]  midodrine (PROAMATINE) 10 MG tablet Take 1 tablet (10 mg total) by mouth every Monday, Wednesday, and Friday with hemodialysis. 01/22/23   Shawna Clamp, MD  multivitamin (RENA-VIT) TABS tablet Take 1 tablet by mouth at bedtime. 01/09/23   Bonnielee Haff, MD  nitroGLYCERIN (NITROSTAT) 0.4 MG SL tablet Place 1 tablet (0.4 mg total) under the tongue every 5 (five) minutes as needed for chest pain. PLACE ONE TABLET UNDER THE TONGUE EVERY 5 MINUTES FOR 3  DOSES AS NEEDED Strength: 0.4 mg 02/07/22   Arnoldo Lenis, MD  Nystatin (GERHARDT'S BUTT CREAM) CREA Apply 1 Application topically 2 (two) times daily. 01/09/23   Bonnielee Haff, MD  oxyCODONE (OXY IR/ROXICODONE) 5 MG immediate release tablet Take 0.5-1 tablets (2.5-5 mg total) by mouth every 4 (four) hours as needed for moderate pain. 01/16/23   Bonnielee Haff, MD  polyethylene glycol (MIRALAX / GLYCOLAX) 17 g packet Take 17 g by mouth 2 (two) times daily. 01/16/23   Bonnielee Haff, MD  prochlorperazine (COMPAZINE) 5 MG tablet Take 1 tablet (5 mg total) by mouth every 6 (six) hours as needed for nausea or vomiting. 01/16/23   Bonnielee Haff, MD  RESTASIS 0.05 % ophthalmic emulsion Place 1 drop into both eyes 2 (two) times daily. 06/19/22   [provider]  senna-docusate (SENOKOT-S) 8.6-50 MG tablet Take 2 tablets by mouth 2 (two) times daily. 01/09/23   Bonnielee Haff, MD  simethicone (MYLICON) 80 MG chewable tablet Chew 80 mg by mouth every 6 (six) hours as needed for flatulence.    [provider]  TRUE METRIX BLOOD GLUCOSE TEST test strip USE TO TEST BLOOD SUGAR UP TO TWICE DAILY OR AS DIRECTED 08/31/21   Renato Shin, MD      Allergies    Metformin, Niacin, and Niacin and related    Review of Systems   Review of Systems  Unable to perform ROS: Mental status change    Physical Exam Updated Vital Signs BP 102/67   Pulse (!) 148   Temp 100.2 F (37.9 C) (Oral)   Resp (!) 23   Ht 1.905 m ('6\' 3"'$ )   Wt 83 kg   SpO2 97%   BMI 22.87 kg/m  Physical Exam Constitutional:      General: He is in acute distress.     Appearance: He is ill-appearing. He is not diaphoretic.  HENT:     Head: Normocephalic and atraumatic.     Mouth/Throat:     Mouth: Mucous membranes are dry.  Eyes:     Extraocular Movements: Extraocular movements intact.     Pupils: Pupils are equal, round, and reactive to light.  Cardiovascular:     Rate and Rhythm: Tachycardia present.   Pulmonary:     Effort: Respiratory distress present.     Breath sounds: No wheezing, rhonchi or rales.  Abdominal:     General: Abdomen is flat.     Tenderness: There is no abdominal tenderness.  Musculoskeletal:     Cervical back: No rigidity.     Right lower leg: No edema.     Left lower leg: No edema.     Comments: Feet are wrapped and padded.  Neurological:     Mental Status: He is alert. Mental status is at baseline.     Comments: Patient mentating able to talk on the phone.  Patient moving upper  extremities     ED Results / Procedures / Treatments   Labs (all labs ordered are listed, but only abnormal results are displayed) Labs Reviewed  CBC WITH DIFFERENTIAL/PLATELET - Abnormal; Notable for the following components:      Result Value   WBC 19.5 (*)    RBC 2.97 (*)    Hemoglobin 9.4 (*)    HCT 30.9 (*)    MCV 104.0 (*)    RDW 18.4 (*)    Platelets 488 (*)    Neutro Abs 16.2 (*)    Monocytes Absolute 1.7 (*)    Abs Immature Granulocytes 0.18 (*)    All other components within normal limits  CBG MONITORING, ED - Abnormal; Notable for the following components:   Glucose-Capillary 108 (*)    All other components within normal limits  I-STAT CHEM 8, ED - Abnormal; Notable for the following components:   Potassium 6.6 (*)    Creatinine, Ser 2.00 (*)    Glucose, Bld 128 (*)    Calcium, Ion 1.05 (*)    Hemoglobin 10.5 (*)    HCT 31.0 (*)    All other components within normal limits  CULTURE, BLOOD (ROUTINE X 2)  CULTURE, BLOOD (ROUTINE X 2)  RESP PANEL BY RT-PCR (RSV, FLU A&B, COVID)  RVPGX2  COMPREHENSIVE METABOLIC PANEL  LACTIC ACID, PLASMA  LACTIC ACID, PLASMA  URINALYSIS, ROUTINE W REFLEX MICROSCOPIC  PROTIME-INR  APTT  GLUCOSE, RANDOM    EKG EKG Interpretation  Date/Time:  Wednesday January 22 2023 17:22:46 EST Ventricular Rate:  151 PR Interval:    QRS Duration: 112 QT Interval:  290 QTC Calculation: 460 R Axis:   18 Text  Interpretation: Supraventricular tachycardia Borderline intraventricular conduction delay Repolarization abnormality, prob rate related Confirmed by Fredia Sorrow [phone]) on 01/22/2023 5:26:42 PM  Radiology DG Chest Port 1 View  Result Date: 01/22/2023 CLINICAL DATA:  Altered mental status EXAM: PORTABLE CHEST 1 VIEW COMPARISON:  Chest x-ray dated November 04, 2022 FINDINGS: Cardiac and mediastinal contours are unchanged. Right central venous catheter is unchanged in position. Right costophrenic angle is excluded from field of view. Visualized lungs are clear. The visualized skeletal structures are unremarkable. IMPRESSION: No active disease. Electronically Signed   By: Yetta Glassman M.D.   On: 01/22/2023 18:01    Procedures Procedures    Medications Ordered in ED Medications  lactated ringers infusion (has no administration in time range)  lactated ringers bolus 1,000 mL (1,000 mLs Intravenous New Bag/Given 01/22/23 1811)    And  lactated ringers bolus 1,000 mL (has no administration in time range)    And  lactated ringers bolus 1,000 mL (has no administration in time range)  ceFEPIme (MAXIPIME) 2 g in sodium chloride 0.9 % 100 mL IVPB (has no administration in time range)  metroNIDAZOLE (FLAGYL) IVPB 500 mg (has no administration in time range)  vancomycin (VANCOCIN) IVPB 1000 mg/200 mL premix (has no administration in time range)  calcium gluconate 1 g/ 50 mL sodium chloride IVPB (has no administration in time range)  insulin aspart (novoLOG) injection 5 Units (has no administration in time range)    And  dextrose 50 % solution 50 mL (has no administration in time range)    ED Course/ Medical Decision Making/ A&P                             Medical Decision Making Amount and/or Complexity of Data Reviewed Labs: ordered.  Radiology: ordered. ECG/medicine tests: ordered.  Risk OTC drugs. Prescription drug management. Decision regarding hospitalization.   CRITICAL  CARE Performed by: Fredia Sorrow Total critical care time: 60 minutes Critical care time was exclusive of separately billable procedures and treating other patients. Critical care was necessary to treat or prevent imminent or life-threatening deterioration. Critical care was time spent personally by me on the following activities: development of treatment plan with patient and/or surrogate as well as nursing, discussions with consultants, evaluation of patient's response to treatment, examination of patient, obtaining history from patient or surrogate, ordering and performing treatments and interventions, ordering and review of laboratory studies, ordering and review of radiographic studies, pulse oximetry and re-evaluation of patient's condition.  Patient presenting from dialysis.  First we were not sure whether dialysis was completed or not.  We did not get that from EMS.  Initial i-STAT potassium was 6.6.  So he received calcium gluconate some dextrose and insulin to correct that.  But then was complete metabolic panel his potassium was normal in the 4 range.  His chest cure did not show fluid.  So he may have completed dialysis.  But still not clear.   Patient's initial lactic acid was 5.5 and then it moved up to 6.4.  Sepsis parameters were initiated patient got 30 cc/kg fluid challenge despite being on dialysis.  This seemed to help blood pressures did come up above 194 systolic.  But now he is drifting back down and his maps are down in the 50s.  Complete metabolic panel without significant abnormalities.  GFR 35 baseline.  Liver function test without significant abnormalities.  CBC marked leukocytosis with a white count of 19.5.  Hemoglobin 9.4.  Platelets 488.  As stated chest x-ray without any acute findings.  Also respiratory panel negative for COVID RSV and influenza.  With patient dropping his pressures down below 90 consistently since 2030 patient started on Levophed.  Contacted  Dr. Kyung Rudd pulmonology intensivist and they will admit to the [facility] ICU.   Exact source not clear.  Patient was treated with broad-spectrum antibiotics.  Patient is still mentating well.  Airway intact.  Oxygen sats good.  Recently had ordered CT head CT chest CT abdomen pelvis when his blood pressures were improving.  But that did not get done before he became hypotensive again.      Final Clinical Impression(s) / ED Diagnoses Final diagnoses:  Altered mental status, unspecified altered mental status type  Hyperkalemia  Sepsis, due to unspecified organism, unspecified whether acute organ dysfunction present [facility])  End stage renal disease on dialysis [facility])    Rx / DC Orders ED Discharge Orders     None         Fredia Sorrow, MD 01/22/23 Cecille Amsterdam    Fredia Sorrow, MD 01/22/23 2128   Addendum: Patient with sacral decubitus open muscle exposed does not appear to be significantly infected but could possibly be a source.  This has been known to be present in the past.   Fredia Sorrow, MD 01/22/23 2237

## 2023-01-22 NOTE — Progress Notes (Addendum)
Pharmacy Antibiotic Note  [name] is a 77 y.o. male admitted on 01/22/2023 with sepsis. Patient has ESRD on HD MWF. He was discharged after hospitalization for cholecystitis and sacral ulcer to SNF on 1/30 and readmitted 1/31 with hypotension, tachypnea, tachycardia and WBC up to 19.5 from 11.3.  Pharmacy has been consulted for vancomycin and cefepime dosing.  Plan: Vancomycin '1000mg'$  x1 then '1000mg'$  post HD MWF Cefepime 2g q24 hr after HD on HD days Metronidazole per team  Monitor cultures, clinical status, HD schedule/tolerance, vancomycin level Narrow abx as able and f/u duration     No data recorded.  Recent Labs  Lab 01/16/23 0544 01/17/23 0742 01/20/23 0942 01/21/23 1055 01/22/23 1738 01/22/23 1746  WBC 11.7* 11.3*  --   --  19.5*  --   CREATININE  --  4.32* 5.03* 3.03*  --  2.00*    Estimated Creatinine Clearance: 36.3 mL/min (A) (by C-G formula based on SCr of 2 mg/dL (H)).    Allergies  Allergen Reactions   Metformin Nausea And Vomiting   Niacin    Niacin And Related Other (See Comments)    Reaction: flushing and burning side effects even with extended release    Antimicrobials this admission: Vanc 1/31 >>  Cefe 1/31 >>  MTZ 1/31 >>  Dose adjustments this admission: N/a  Microbiology results: 1/31 BCx: pend  12/26 MRSA PCR: neg  Thank you for allowing pharmacy to be a part of this patient's care.  Benetta Spar, PharmD, BCPS, BCCP Clinical Pharmacist  Please check AMION for all [place] phone numbers After 10:00 PM, call [place] [phone]

## 2023-01-22 NOTE — ED Triage Notes (Signed)
Pt at dialysis and bp was low. Pt from [facility] home. Pt arrived ams, responding to sternal rub, mild full body shaking noted. Tachypnea. Eyes open.

## 2023-01-22 NOTE — ED Notes (Signed)
Per edp, CT on hold due to pt's disposition

## 2023-01-22 NOTE — ED Notes (Signed)
Edp at bedside at 1728, pt received one liter NS in route.

## 2023-01-22 NOTE — ED Notes (Signed)
Update given to family, [name], with permission of pt

## 2023-01-22 NOTE — Sepsis Progress Note (Signed)
eLink is following this Code sepsis.

## 2023-01-22 NOTE — ED Notes (Signed)
Lab at bedside attempting blood cultures. Patient already been stuck multiple times.

## 2023-01-23 ENCOUNTER — Inpatient Hospital Stay (HOSPITAL_COMMUNITY): Payer: Medicare Other

## 2023-01-23 DIAGNOSIS — N186 End stage renal disease: Secondary | ICD-10-CM

## 2023-01-23 DIAGNOSIS — R609 Edema, unspecified: Secondary | ICD-10-CM

## 2023-01-23 DIAGNOSIS — R404 Transient alteration of awareness: Secondary | ICD-10-CM

## 2023-01-23 DIAGNOSIS — G9341 Metabolic encephalopathy: Secondary | ICD-10-CM | POA: Diagnosis present

## 2023-01-23 DIAGNOSIS — R6521 Severe sepsis with septic shock: Secondary | ICD-10-CM | POA: Diagnosis not present

## 2023-01-23 DIAGNOSIS — B9620 Unspecified Escherichia coli [E. coli] as the cause of diseases classified elsewhere: Secondary | ICD-10-CM | POA: Diagnosis present

## 2023-01-23 DIAGNOSIS — Z992 Dependence on renal dialysis: Secondary | ICD-10-CM

## 2023-01-23 DIAGNOSIS — R7881 Bacteremia: Secondary | ICD-10-CM | POA: Diagnosis present

## 2023-01-23 DIAGNOSIS — A419 Sepsis, unspecified organism: Secondary | ICD-10-CM | POA: Diagnosis not present

## 2023-01-23 DIAGNOSIS — M869 Osteomyelitis, unspecified: Secondary | ICD-10-CM

## 2023-01-23 DIAGNOSIS — R4182 Altered mental status, unspecified: Secondary | ICD-10-CM | POA: Diagnosis not present

## 2023-01-23 DIAGNOSIS — L89154 Pressure ulcer of sacral region, stage 4: Secondary | ICD-10-CM

## 2023-01-23 HISTORY — PX: IR REMOVAL TUN CV CATH W/O FL: IMG2289

## 2023-01-23 LAB — BLOOD CULTURE ID PANEL (REFLEXED) - BCID2
A.calcoaceticus-baumannii: NOT DETECTED
A.calcoaceticus-baumannii: NOT DETECTED
Bacteroides fragilis: NOT DETECTED
Bacteroides fragilis: NOT DETECTED
CTX-M ESBL: NOT DETECTED
CTX-M ESBL: NOT DETECTED
Candida albicans: NOT DETECTED
Candida albicans: NOT DETECTED
Candida auris: NOT DETECTED
Candida auris: NOT DETECTED
Candida glabrata: NOT DETECTED
Candida glabrata: NOT DETECTED
Candida krusei: NOT DETECTED
Candida krusei: NOT DETECTED
Candida parapsilosis: NOT DETECTED
Candida parapsilosis: NOT DETECTED
Candida tropicalis: NOT DETECTED
Candida tropicalis: NOT DETECTED
Carbapenem resist OXA 48 LIKE: NOT DETECTED
Carbapenem resist OXA 48 LIKE: NOT DETECTED
Carbapenem resistance IMP: NOT DETECTED
Carbapenem resistance IMP: NOT DETECTED
Carbapenem resistance KPC: NOT DETECTED
Carbapenem resistance KPC: NOT DETECTED
Carbapenem resistance NDM: NOT DETECTED
Carbapenem resistance NDM: NOT DETECTED
Carbapenem resistance VIM: NOT DETECTED
Carbapenem resistance VIM: NOT DETECTED
Cryptococcus neoformans/gattii: NOT DETECTED
Cryptococcus neoformans/gattii: NOT DETECTED
Enterobacter cloacae complex: NOT DETECTED
Enterobacter cloacae complex: NOT DETECTED
Enterobacterales: DETECTED — AB
Enterobacterales: DETECTED — AB
Enterococcus Faecium: NOT DETECTED
Enterococcus Faecium: NOT DETECTED
Enterococcus faecalis: NOT DETECTED
Enterococcus faecalis: NOT DETECTED
Escherichia coli: DETECTED — AB
Escherichia coli: NOT DETECTED
Haemophilus influenzae: NOT DETECTED
Haemophilus influenzae: NOT DETECTED
Klebsiella aerogenes: NOT DETECTED
Klebsiella aerogenes: NOT DETECTED
Klebsiella oxytoca: NOT DETECTED
Klebsiella oxytoca: NOT DETECTED
Klebsiella pneumoniae: NOT DETECTED
Klebsiella pneumoniae: NOT DETECTED
Listeria monocytogenes: NOT DETECTED
Listeria monocytogenes: NOT DETECTED
Neisseria meningitidis: NOT DETECTED
Neisseria meningitidis: NOT DETECTED
Proteus species: NOT DETECTED
Proteus species: NOT DETECTED
Pseudomonas aeruginosa: NOT DETECTED
Pseudomonas aeruginosa: NOT DETECTED
Salmonella species: NOT DETECTED
Salmonella species: NOT DETECTED
Serratia marcescens: NOT DETECTED
Serratia marcescens: NOT DETECTED
Staphylococcus aureus (BCID): NOT DETECTED
Staphylococcus aureus (BCID): NOT DETECTED
Staphylococcus epidermidis: NOT DETECTED
Staphylococcus epidermidis: NOT DETECTED
Staphylococcus lugdunensis: NOT DETECTED
Staphylococcus lugdunensis: NOT DETECTED
Staphylococcus species: NOT DETECTED
Staphylococcus species: NOT DETECTED
Stenotrophomonas maltophilia: NOT DETECTED
Stenotrophomonas maltophilia: NOT DETECTED
Streptococcus agalactiae: NOT DETECTED
Streptococcus agalactiae: NOT DETECTED
Streptococcus pneumoniae: NOT DETECTED
Streptococcus pneumoniae: NOT DETECTED
Streptococcus pyogenes: NOT DETECTED
Streptococcus pyogenes: NOT DETECTED
Streptococcus species: NOT DETECTED
Streptococcus species: NOT DETECTED

## 2023-01-23 LAB — CBC
HCT: 27.2 % — ABNORMAL LOW (ref 39.0–52.0)
Hemoglobin: 8.5 g/dL — ABNORMAL LOW (ref 13.0–17.0)
MCH: 32.2 pg (ref 26.0–34.0)
MCHC: 31.3 g/dL (ref 30.0–36.0)
MCV: 103 fL — ABNORMAL HIGH (ref 80.0–100.0)
Platelets: 392 10*3/uL (ref 150–400)
RBC: 2.64 MIL/uL — ABNORMAL LOW (ref 4.22–5.81)
RDW: 18.5 % — ABNORMAL HIGH (ref 11.5–15.5)
WBC: 21.3 10*3/uL — ABNORMAL HIGH (ref 4.0–10.5)
nRBC: 0.1 % (ref 0.0–0.2)

## 2023-01-23 LAB — RENAL FUNCTION PANEL
Albumin: 1.8 g/dL — ABNORMAL LOW (ref 3.5–5.0)
Anion gap: 12 (ref 5–15)
BUN: 18 mg/dL (ref 8–23)
CO2: 25 mmol/L (ref 22–32)
Calcium: 9.3 mg/dL (ref 8.9–10.3)
Chloride: 97 mmol/L — ABNORMAL LOW (ref 98–111)
Creatinine, Ser: 2.31 mg/dL — ABNORMAL HIGH (ref 0.61–1.24)
GFR, Estimated: 28 mL/min — ABNORMAL LOW (ref >60)
Glucose, Bld: 112 mg/dL — ABNORMAL HIGH (ref 70–99)
Phosphorus: 2.8 mg/dL (ref 2.5–4.6)
Potassium: 4.1 mmol/L (ref 3.5–5.1)
Sodium: 134 mmol/L — ABNORMAL LOW (ref 135–145)

## 2023-01-23 LAB — GLUCOSE, CAPILLARY
Glucose-Capillary: 102 mg/dL — ABNORMAL HIGH (ref 70–99)
Glucose-Capillary: 104 mg/dL — ABNORMAL HIGH (ref 70–99)
Glucose-Capillary: 104 mg/dL — ABNORMAL HIGH (ref 70–99)
Glucose-Capillary: 105 mg/dL — ABNORMAL HIGH (ref 70–99)
Glucose-Capillary: 114 mg/dL — ABNORMAL HIGH (ref 70–99)
Glucose-Capillary: 116 mg/dL — ABNORMAL HIGH (ref 70–99)
Glucose-Capillary: 123 mg/dL — ABNORMAL HIGH (ref 70–99)

## 2023-01-23 LAB — LACTIC ACID, PLASMA: Lactic Acid, Venous: 2.2 mmol/L (ref 0.5–1.9)

## 2023-01-23 LAB — MAGNESIUM: Magnesium: 1.5 mg/dL — ABNORMAL LOW (ref 1.7–2.4)

## 2023-01-23 LAB — MRSA NEXT GEN BY PCR, NASAL: MRSA by PCR Next Gen: NOT DETECTED

## 2023-01-23 MED ORDER — ACETAMINOPHEN 500 MG PO TABS
1000.0000 mg | ORAL_TABLET | Freq: Three times a day (TID) | ORAL | Status: DC
  Administered 2023-01-23 – 2023-02-08 (×46): 1000 mg via ORAL
  Filled 2023-01-23 (×47): qty 2

## 2023-01-23 MED ORDER — MAGNESIUM SULFATE 2 GM/50ML IV SOLN
2.0000 g | Freq: Once | INTRAVENOUS | Status: AC
  Administered 2023-01-23: 2 g via INTRAVENOUS
  Filled 2023-01-23: qty 50

## 2023-01-23 MED ORDER — ACETAMINOPHEN 10 MG/ML IV SOLN
1000.0000 mg | Freq: Once | INTRAVENOUS | Status: AC
  Administered 2023-01-23: 1000 mg via INTRAVENOUS
  Filled 2023-01-23: qty 100

## 2023-01-23 MED ORDER — ENSURE ENLIVE PO LIQD
237.0000 mL | Freq: Three times a day (TID) | ORAL | Status: DC
  Administered 2023-01-23 – 2023-02-08 (×40): 237 mL via ORAL
  Filled 2023-01-23: qty 237

## 2023-01-23 MED ORDER — PIPERACILLIN-TAZOBACTAM IN DEX 2-0.25 GM/50ML IV SOLN
2.2500 g | Freq: Three times a day (TID) | INTRAVENOUS | Status: DC
  Administered 2023-01-23 – 2023-01-24 (×5): 2.25 g via INTRAVENOUS
  Filled 2023-01-23 (×6): qty 50

## 2023-01-23 MED ORDER — MIDODRINE HCL 5 MG PO TABS
10.0000 mg | ORAL_TABLET | ORAL | Status: DC
  Administered 2023-01-27 – 2023-02-05 (×6): 10 mg via ORAL
  Filled 2023-01-23 (×6): qty 2

## 2023-01-23 MED ORDER — ATORVASTATIN CALCIUM 40 MG PO TABS
40.0000 mg | ORAL_TABLET | Freq: Every day | ORAL | Status: DC
  Administered 2023-01-23 – 2023-02-08 (×16): 40 mg via ORAL
  Filled 2023-01-23 (×16): qty 1

## 2023-01-23 MED ORDER — OXYCODONE HCL 5 MG PO TABS
5.0000 mg | ORAL_TABLET | ORAL | Status: AC | PRN
  Administered 2023-01-23 – 2023-01-26 (×11): 5 mg via ORAL
  Filled 2023-01-23 (×12): qty 1

## 2023-01-23 MED ORDER — HEPARIN SODIUM (PORCINE) 1000 UNIT/ML IJ SOLN
3.8000 mL | Freq: Once | INTRAMUSCULAR | Status: AC
  Administered 2023-01-23: 3800 [IU]
  Filled 2023-01-23: qty 4

## 2023-01-23 MED ORDER — PROSOURCE PLUS PO LIQD
30.0000 mL | Freq: Two times a day (BID) | ORAL | Status: DC
  Administered 2023-01-23 – 2023-02-08 (×26): 30 mL via ORAL
  Filled 2023-01-23 (×25): qty 30

## 2023-01-23 MED ORDER — CALCITRIOL 0.25 MCG PO CAPS: 0.2500 ug | ORAL_CAPSULE | ORAL | Status: DC

## 2023-01-23 MED ORDER — PANTOPRAZOLE SODIUM 40 MG IV SOLR
40.0000 mg | Freq: Every day | INTRAVENOUS | Status: DC
  Administered 2023-01-23 – 2023-01-29 (×7): 40 mg via INTRAVENOUS
  Filled 2023-01-23 (×7): qty 10

## 2023-01-23 MED ORDER — HEPARIN SODIUM (PORCINE) 5000 UNIT/ML IJ SOLN
5000.0000 [IU] | Freq: Three times a day (TID) | INTRAMUSCULAR | Status: DC
  Administered 2023-01-23 – 2023-02-08 (×45): 5000 [IU] via SUBCUTANEOUS
  Filled 2023-01-23 (×43): qty 1

## 2023-01-23 MED ORDER — SODIUM CHLORIDE 0.9 % IV SOLN
INTRAVENOUS | Status: DC | PRN
  Administered 2023-02-01: 450 mL via INTRAVENOUS

## 2023-01-23 MED ORDER — RENA-VITE PO TABS
1.0000 | ORAL_TABLET | Freq: Every day | ORAL | Status: DC
  Administered 2023-01-23 – 2023-02-07 (×15): 1 via ORAL
  Filled 2023-01-23 (×15): qty 1

## 2023-01-23 MED ORDER — MEDIHONEY WOUND/BURN DRESSING EX PSTE
1.0000 | PASTE | Freq: Every day | CUTANEOUS | Status: DC
  Administered 2023-01-23 – 2023-02-08 (×14): 1 via TOPICAL
  Filled 2023-01-23 (×4): qty 44

## 2023-01-23 MED ORDER — ASPIRIN 81 MG PO TBEC
81.0000 mg | DELAYED_RELEASE_TABLET | Freq: Every day | ORAL | Status: DC
  Administered 2023-01-23 – 2023-02-08 (×17): 81 mg via ORAL
  Filled 2023-01-23 (×17): qty 1

## 2023-01-23 MED ORDER — DAKINS (1/4 STRENGTH) 0.125 % EX SOLN
Freq: Every day | CUTANEOUS | Status: AC
  Filled 2023-01-23: qty 473

## 2023-01-23 MED ORDER — LIDOCAINE HCL 1 % IJ SOLN
INTRAMUSCULAR | Status: AC
  Administered 2023-01-23: 8 mL via INTRADERMAL
  Filled 2023-01-23: qty 20

## 2023-01-23 MED ORDER — GABAPENTIN 100 MG PO CAPS
100.0000 mg | ORAL_CAPSULE | Freq: Three times a day (TID) | ORAL | Status: DC
  Administered 2023-01-23 – 2023-02-08 (×47): 100 mg via ORAL
  Filled 2023-01-23 (×46): qty 1

## 2023-01-23 NOTE — Progress Notes (Signed)
NAME:  [name], MRN:  [id number], DOB:  1946/05/27, LOS: 1 ADMISSION DATE:  01/22/2023, CONSULTATION DATE:  2/1 REFERRING MD:  Dr. Rogene Houston EDP Forestine Na, CHIEF COMPLAINT:  Hypotension   History of Present Illness:  77 year old male with past medical history as below, which is significant for ESRD on dialysis, heart failure with reduced EF, diabetes mellitus, paroxysmal atrial fib, and coronary artery disease.  He has been bedbound for approximately the last year and has a known large sacral decub with tunneling.  He has a recent admission to [facility] health on 12/21.  He had previously been diagnosed with cholecystitis and underwent cholecystostomy.  Tube had become dislodged and fortunately the bile duct had recanalized so tube was not replaced.  He was admitted on 12/21 for inpatient workup prior to cholecystectomy.  He underwent left heart cath and was found to have multivessel CAD without critical ischemia.  He underwent cholecystectomy on 12/28.  Hospitalization complicated by decubitus ulcer POA, which was found to be infected with E. coli and E faecalis status post treatment with antibiotics.  No abscess or osteomyelitis on CT and therefore surgery was not indicated.  Palliative care was consulted during the admission and the patient/family continues to wish for full scope with possible aggressive measures.  Course also complicated by malnutrition.  The patient did briefly require core track for nutritional needs, but was ultimately able to be nutritional goals by p.o. alone.  Discharged 1/30.  He presented to [facility] emergency department 1/31 from dialysis with complaint of low blood pressure.  It is unknown whether or not he received dialysis prior to being sent.  There was also concern of full body shaking.  Upon arrival to the emergency department he was minimally responsive but did respond to sternal rub and was ultimately able to arouse and answer some questions.  He was found to be  tachycardic, febrile, tachypneic, and hypotensive.  Initially responded to IV fluid resuscitation.  Initial lactic acid 5.5 and increased to 6.4.  Laboratory evaluation significant for WBC 19.5.  He became hypotensive again and was ultimately started on norepinephrine infusion.  His presentation was felt to be consistent with septic shock and he was initiated on empiric antibiotics.  PCCM was asked to admit to ICU at [facility]  Pertinent  Medical History   has a past medical history of Anginal pain ([place]), Anxiety, Benign prostatic hypertrophy, CAD (coronary artery disease), Cardiomyopathy ([place]), Degenerative joint disease, Depression, Diabetes mellitus, type II ([place]) (12/10/2012), Erectile dysfunction, ESRD (end stage renal disease) ([place]), Flu (2013), GERD (gastroesophageal reflux disease), Gout, Hemorrhoids, High cholesterol, Hypertension, Myocardial infarction ([place]), Neuropathy, NSVT (nonsustained ventricular tachycardia) ([place]), Obesity, PAF (paroxysmal atrial fibrillation) ([place]), Spinal cord stimulator, Tubular adenoma (2014), and Vertigo.   Significant Hospital Events: Including procedures, antibiotic start and stop dates in addition to other pertinent events   1/21 > 1/30 admit for cholecystectomy 1/33 admit for shock  Interim History / Subjective:  Patient is somnolent but awake to verbal stimulation.  He is able to say his name and answer some simple questions.  Objective   Blood pressure 109/73, pulse 85, temperature 99.8 F (37.7 C), temperature source Axillary, resp. rate 20, height '6\' 3"'$  (1.905 m), weight 83 kg, SpO2 100 %.        Intake/Output Summary (Last 24 hours) at 01/23/2023 0848 Last data filed at 01/23/2023 0600 Gross per 24 hour  Intake 6757.47 ml  Output --  Net 6757.47 ml    [name]  Weights   01/22/23 1809  Weight: 83 kg    Examination: General: Cachectic elderly appearing gentleman in NAD.  Very somnolent but awakes to verbal stimulation and sternal rub. HENT:  [place]/AT, PERRL, mucous membranes dry.  No dentures. Lungs: Clear bilateral breath sounds Cardiovascular: Bradycardic.  Regular rhythm.  No murmur.  Trace edema of bilateral lower extremity. 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  Mild contractures seen in bilateral wrist. Neuro: Able to wake up to verbal stimulation but falls back to sleep very quickly.  Resolved Hospital Problem list     Assessment & Plan:  Septic shock secondary to gram-negative bacteremia.  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  So far patient did not report any GI symptoms.  CT abdomen/pelvis did show coccygeal osteomyelitis.  Will consult surgery for possible debridement.   No MRI because of nerve stimulator.  His past wound culture grew E. Coli resistant to Unasyn and E. faecalis resistant to vancomycin.  Will also consult infectious disease to help with his antibiotic regimen.  We will discuss with them regarding his right IJ tunnel cath. - NE as needed to keep MAP 65. Currently requiring 2 mcg intermittently.  -Already received 2.5 L fluid.  Will avoid further aggressive fluid resuscitation in setting of ESRD and HFrEF - Empiric zosyn, vancomycin.  - Resume home midodrine when taking PO  Lactic acidosis - Improving with IV fluid  Acute metabolic/infectious encephalopathy In the setting of septic shock from bacteremia.  BUN within normal limits. -Minimize centrally acting medications -Hopefully his mental status will improve with treating the infection -Will attempt bedside swallow test when he is more awake  ESRD on HD MWF Besides hyperkalemia, his BUN was WNL on admission.  This might suggest that he received the full HD section.  His potassium normalized this morning.  No indication for  urgent dialysis. -Nephrology is consulted. -Will discuss with ID regarding his right IJ tunnel cath  Chronic HFrEF - LVEF recently improved from 35% to 55% PAF CAD - extensive workup last admission including LHC. Multivessel CAD with no significant stenosis.  -Volume removal with HD -Aspirin and Lipitor on hold since patient cannot reliably takes p.o. meds -Hold isosorbide due to shock -Limited GDMT with his ESRD  Sacral decub POA - WOC consult  DM2 - CBG monitoring and SSI  Moderate protein calorie malnutrition -If mental status doesn't improve with pressors and ABX we may need to place cortrak for nutrition.   RUE edema -doppler  Failure to thrive Functional quadriplegia - Palliative has been involved recently and signed off. Patient and family are very clear for full code and scope.   Best Practice (right click and "Reselect all SmartList Selections" daily)   Diet/type: NPO DVT prophylaxis: prophylactic heparin  GI prophylaxis: PPI Lines: Dialysis Catheter Foley:  N/A Code Status:  full code Last date of multidisciplinary goals of care discussion '[ ]'$   Labs   CBC: Recent Labs  Lab 01/17/23 0742 01/22/23 1738 01/22/23 1746 01/23/23 0448  WBC 11.3* 19.5*  --  21.3*  NEUTROABS  --  16.2*  --   --   HGB 8.0* 9.4* 10.5* 8.5*  HCT 24.9* 30.9* 31.0* 27.2*  MCV 100.4* 104.0*  --  103.0*  PLT 328 488*  --  392     Basic Metabolic Panel:  Recent Labs  Lab 01/17/23 0742 01/20/23 0942 01/21/23 1055 01/22/23 1738 01/22/23 1746 01/22/23 1838 01/23/23 0448  NA 134* 139 134* 138 135  --  134*  K 3.2* 4.6 4.2 4.7 6.6*  --  4.1  CL 95* 99 95* 98 102  --  97*  CO2 '28 25 27 22  '$ --   --  25  GLUCOSE 128* 139* 112* 129* 128* 179* 112*  BUN 49* 62* 39* 18 23  --  18  CREATININE 4.32* 5.03* 3.03* 1.96* 2.00*  --  2.31*  CALCIUM 10.0 10.4* 9.7 9.2  --   --  9.3  MG  --   --   --   --   --   --  1.5*  PHOS 4.6  --  3.7  --   --   --  2.8    GFR: Estimated  Creatinine Clearance: 31.4 mL/min (A) (by C-G formula based on SCr of 2.31 mg/dL (H)). Recent Labs  Lab 01/17/23 0742 01/22/23 1738 01/22/23 2005 01/23/23 0448  WBC 11.3* 19.5*  --  21.3*  LATICACIDVEN  --  5.5* 6.4* 2.2*     Liver Function Tests: Recent Labs  Lab 01/17/23 0742 01/21/23 1055 01/22/23 1738 01/23/23 0448  AST  --   --  45*  --   ALT  --   --  16  --   ALKPHOS  --   --  70  --   BILITOT  --   --  0.8  --   PROT  --   --  7.7  --   ALBUMIN 2.0* 2.1* 2.2* 1.8*    No results for input(s): "LIPASE", "AMYLASE" in the last 168 hours. No results for input(s): "AMMONIA" in the last 168 hours.  ABG    Component Value Date/Time   PHART 7.47 (H) 07/30/2022 0430   PCO2ART 40 07/30/2022 0430   PO2ART 75 (L) 07/30/2022 0430   HCO3 29.1 (H) 07/30/2022 0430   TCO2 25 01/22/2023 1746   O2SAT 97.8 07/30/2022 0430     Coagulation Profile: Recent Labs  Lab 01/22/23 1838  INR 1.3*     Cardiac Enzymes: No results for input(s): "CKTOTAL", "CKMB", "CKMBINDEX", "TROPONINI" in the last 168 hours.  HbA1C: Hgb A1c MFr Bld  Date/Time Value Ref Range Status  12/10/2022 11:22 AM 4.4 (L) 4.8 - 5.6 % Final    Comment:    (NOTE)         Prediabetes: 5.7 - 6.4         Diabetes: >6.4         Glycemic control for adults with diabetes: <7.0   06/04/2022 02:11 PM 6.1 (H) 4.8 - 5.6 % Final    Comment:    (NOTE) Pre diabetes:          5.7%-6.4%  Diabetes:              >6.4%  Glycemic control for   <7.0% adults with diabetes     CBG: Recent Labs  Lab 01/22/23 1854 01/22/23 1925 01/23/23 0009 01/23/23 0312 01/23/23 0737  GLUCAP 129* 95 104* 116* 114*     Review of Systems:   Patient is encephalopathic and/or intubated. Therefore history has been obtained from chart review.    Past Medical History:  He,  has a past medical history of Anginal pain ([place]), Anxiety, Benign prostatic hypertrophy, CAD (coronary artery disease), Cardiomyopathy ([place]), Degenerative  joint disease, Depression, Diabetes mellitus, type II ([place]) (12/10/2012), Erectile dysfunction,  ESRD (end stage renal disease) ([place]), Flu (2013), GERD (gastroesophageal reflux disease), Gout, Hemorrhoids, High cholesterol, Hypertension, Myocardial infarction ([place]), Neuropathy, NSVT (nonsustained ventricular tachycardia) ([place]), Obesity, PAF (paroxysmal atrial fibrillation) ([place]), Spinal cord stimulator, Tubular adenoma (2014), and Vertigo.   Surgical History:   Past Surgical History:  Procedure Laterality Date   BACK SURGERY  2007   x2   BIOPSY  02/05/2021   Procedure: BIOPSY;  Surgeon: Daneil Dolin, MD;  [facility]: [facility];  Service: Endoscopy;;   CARDIAC CATHETERIZATION     with stent placement   CHOLECYSTECTOMY N/A 12/19/2022   Procedure: LAPAROSCOPIC CHOLECYSTECTOMY;  Surgeon: Stark Klein, MD;  Location: [facility];  Service: General;  Laterality: N/A;   COLONOSCOPY WITH ESOPHAGOGASTRODUODENOSCOPY (EGD) N/A 11/23/2013   Dr. Oneida Alar- TCS= left sided colitis and mild proctitis likely due to ischemic colitis, moderate internal hemorrhoids, small nodule in the rectum, bx=tubular adenoma, EGD=-mild non-erosive gastritis   COLONOSCOPY WITH PROPOFOL N/A 07/24/2020   hemorrhoids,  rectosigmoid lipoma, small adenoma removed.   CORONARY ANGIOPLASTY     ESOPHAGOGASTRODUODENOSCOPY (EGD) WITH PROPOFOL N/A 02/05/2021   Normal esophagus, erythematous mucosa in entire stomach, no obvious ulcer, s/p biopsy. Normal duodenum. Negative H.pylori.    HERNIA REPAIR     INSERTION OF DIALYSIS CATHETER N/A 08/06/2022   Procedure: INSERTION OF DIALYSIS CATHETER;  Surgeon: Rosetta Posner, MD;  Location: [facility] OR[facility];  Service: Vascular;  Laterality: N/A;   IR EXCHANGE BILIARY DRAIN  10/17/2022   IR PERC CHOLECYSTOSTOMY  08/20/2022   JOINT REPLACEMENT     LEFT HEART CATH AND CORONARY ANGIOGRAPHY N/A 12/17/2022   Procedure: LEFT HEART CATH AND CORONARY ANGIOGRAPHY;  Surgeon: Leonie Man, MD;  Location: [facility]  CV LAB;  Service: Cardiovascular;  Laterality: N/A;   LEFT HEART CATHETERIZATION WITH CORONARY ANGIOGRAM N/A 11/16/2012   Procedure: LEFT HEART CATHETERIZATION WITH CORONARY ANGIOGRAM;  Surgeon: Sherren Mocha, MD;  Location[facility] CATH LAB;  Service: Cardiovascular;  Laterality: N/A;   PERCUTANEOUS CORONARY STENT INTERVENTION (PCI-S)  11/16/2012   Procedure: PERCUTANEOUS CORONARY STENT INTERVENTION (PCI-S);  Surgeon: Sherren Mocha, MD;  Location: [facility] CATH LAB;  Service: Cardiovascular;;   POLYPECTOMY  07/24/2020   Procedure: POLYPECTOMY;  Surgeon: Daneil Dolin, MD;  [facility];  Service: Endoscopy;;   SPINAL CORD STIMULATOR INSERTION N/A 06/10/2022   Procedure: Spinal cord stimulator placement;  Surgeon: Newman Pies, MD;  Location: [facility];  Service: Neurosurgery;  Laterality: N/A;  RM 19/ to follow   THUMB AMPUTATION Left    50 years ago   TOTAL KNEE ARTHROPLASTY Right 11/15/2013   Procedure: RIGHT TOTAL KNEE ARTHROPLASTY;  Surgeon: Mauri Pole, MD;  Location: [facility] OR[facility];  Service: Orthopedics;  Laterality: Right;   TOTAL KNEE ARTHROPLASTY Left 03/07/2015   Procedure: LEFT TOTAL KNEE ARTHROPLASTY;  Surgeon: Paralee Cancel, MD;  Location: [facility];  Service: Orthopedics;  Laterality: Left;     Social History:   reports that he quit smoking about 30 years ago. His smoking use included cigarettes. He started smoking about 58 years ago. He has a 30.00 pack-year smoking history. He has never used smokeless tobacco. He reports that he does not drink alcohol and does not use drugs.   Family History:  His family history includes CAD in his brother; Cancer in his father and another family member; Diabetes in an other family member; Heart disease in an other family member; Hypertension in his mother and another family member. There is no history of Colon cancer.  Allergies Allergies  Allergen Reactions   Metformin Nausea And Vomiting   Niacin    Niacin And Related Other (See Comments)     Reaction: flushing and burning side effects even with extended release     Home Medications  Prior to Admission medications   Medication Sig Start Date End Date Taking? Authorizing Provider  acetaminophen (TYLENOL) 500 MG tablet Take 2 tablets (1,000 mg total) by mouth every 6 (six) hours. 08/10/22  Yes Shahmehdi, Seyed A, MD  atorvastatin (LIPITOR) 40 MG tablet Take 1 tablet (40 mg total) by mouth daily. 01/10/23  Yes Bonnielee Haff, MD  Darbepoetin Alfa (ARANESP) 200 MCG/0.4ML SOSY injection Inject 0.4 mLs (200 mcg total) into the skin every Friday at 6 PM. 01/10/23  Yes Bonnielee Haff, MD  feeding supplement (ENSURE ENLIVE / ENSURE PLUS) LIQD Take 237 mLs by mouth 3 (three) times daily with meals. 01/09/23  Yes Bonnielee Haff, MD  gabapentin (NEURONTIN) 100 MG capsule Take 1 capsule (100 mg total) by mouth 3 (three) times daily. 01/09/23  Yes Bonnielee Haff, MD  isosorbide dinitrate (ISORDIL) 5 MG tablet Take 1 tablet (5 mg total) by mouth 2 (two) times daily. 01/09/23  Yes Bonnielee Haff, MD  leptospermum manuka honey (MEDIHONEY) PSTE paste Apply 1 Application topically daily. 01/10/23  Yes Bonnielee Haff, MD  melatonin 5 MG TABS Take 5 mg by mouth at bedtime.   Yes [provider]  midodrine (PROAMATINE) 10 MG tablet Take 1 tablet (10 mg total) by mouth every Monday, Wednesday, and Friday with hemodialysis. 01/22/23  Yes Shawna Clamp, MD  multivitamin (RENA-VIT) TABS tablet Take 1 tablet by mouth at bedtime. 01/09/23  Yes Bonnielee Haff, MD  Nystatin (GERHARDT'S BUTT CREAM) CREA Apply 1 Application topically 2 (two) times daily. 01/09/23  Yes Bonnielee Haff, MD  oxyCODONE (OXY IR/ROXICODONE) 5 MG immediate release tablet Take 0.5-1 tablets (2.5-5 mg total) by mouth every 4 (four) hours as needed for moderate pain. 01/16/23  Yes Bonnielee Haff, MD  polyethylene glycol (MIRALAX / GLYCOLAX) 17 g packet Take 17 g by mouth 2 (two) times daily. 01/16/23  Yes Bonnielee Haff, MD   prochlorperazine (COMPAZINE) 5 MG tablet Take 1 tablet (5 mg total) by mouth every 6 (six) hours as needed for nausea or vomiting. 01/16/23  Yes Bonnielee Haff, MD  SANTYL 250 UNIT/GM ointment Apply topically daily. 09/21/22  Yes [provider]  senna-docusate (SENOKOT-S) 8.6-50 MG tablet Take 2 tablets by mouth 2 (two) times daily. 01/09/23  Yes Bonnielee Haff, MD  allopurinol (ZYLOPRIM) 100 MG tablet Take 100 mg by mouth daily. Patient not taking: Reported on 01/22/2023 08/17/20   [provider]  amoxicillin-clavulanate (AUGMENTIN) 500-125 MG tablet Take 1 tablet by mouth daily at 6 PM for 7 days. 01/16/23 01/23/23  Bonnielee Haff, MD  aspirin EC 81 MG tablet Take 1 tablet (81 mg total) by mouth daily. Patient not taking: Reported on 01/22/2023 09/14/19   Herminio Commons, MD  calcitRIOL (ROCALTROL) 0.25 MCG capsule Take 0.25 mcg by mouth every Sunday.  Patient not taking: Reported on 01/22/2023 12/13/16   [provider]  carboxymethylcellulose (REFRESH PLUS) 0.5 % SOLN Place 1 drop into both eyes daily as needed (dry eyes).    [provider]  meclizine (ANTIVERT) 12.5 MG tablet Take 1 tablet (12.5 mg total) by mouth 3 (three) times daily as needed for dizziness. 09/16/22   Manuella Ghazi, Pratik D, DO  RESTASIS 0.05 % ophthalmic emulsion Place 1 drop into both eyes 2 (two)  times daily. Patient not taking: Reported on 01/22/2023 06/19/22   [provider]  simethicone (MYLICON) 80 MG chewable tablet Chew 80 mg by mouth every 6 (six) hours as needed for flatulence.    [provider]  TRUE METRIX BLOOD GLUCOSE TEST test strip USE TO TEST BLOOD SUGAR UP TO TWICE DAILY OR AS DIRECTED 08/31/21   Renato Shin, MD     Critical care time:     Gaylan Gerold, DO Internal Medicine Residency My pager: [phone]

## 2023-01-23 NOTE — Progress Notes (Addendum)
[facility] Progress Note   Subjective:  Discharged from [facility] on 01/21/23 to SNF in [place]. Went to HD 01/22/23 which he completed - was hypotensive throughout, was sent back to ED at the end of HD for concern of worsened AMS, apparently wasn't responding to them at all. In [facility] ED, noted to be febrile with rigors, as well as tachycardia and tachypnea. He was cultured, started on broad spectrum abx, and pressors and now has been transferred to [facility] ICU. Initial K was 6.6 - repeat was 4.1. Seen in room this afternoon - responding appropriately, looks much better. Denies CP or dyspnea, says "my bottom hurts." Blood Cx have flipped positive for GNR. ID consulted with plan for [facility] removal per ICU RN.  Pt is talkative but pleasantly confused-  TDC is already out  Objective Vitals:   01/23/23 1215 01/23/23 1230 01/23/23 1245 01/23/23 1300  BP: 123/64 (!) 145/65 (!) 152/63 123/68  Pulse: 77 (!) 59 84 (!) 54  Resp: (!) '25 17 16 18  '$ Temp:      TempSrc:      SpO2: 100% 100% 100% 100%  Weight:      Height:       Physical Exam General: Frail man, NAD. Room air. More talkative today. Heart: RRR; no murmur Lungs: CTA anteriorly Abdomen: soft has ostomy  Extremities: no LE edema-   legs immoble Dialysis Access: TDC in R chest-  now removed  Additional Objective Labs: Basic Metabolic Panel: Recent Labs  Lab 01/17/23 0742 01/20/23 0942 01/21/23 1055 01/22/23 1738 01/22/23 1746 01/22/23 1838 01/23/23 0448  NA 134*   < > 134* 138 135  --  134*  K 3.2*   < > 4.2 4.7 6.6*  --  4.1  CL 95*   < > 95* 98 102  --  97*  CO2 28   < > 27 22  --   --  25  GLUCOSE 128*   < > 112* 129* 128* 179* 112*  BUN 49*   < > 39* 18 23  --  18  CREATININE 4.32*   < > 3.03* 1.96* 2.00*  --  2.31*  CALCIUM 10.0   < > 9.7 9.2  --   --  9.3  PHOS 4.6  --  3.7  --   --   --  2.8   < > = values in this interval not displayed.   Liver Function Tests: Recent Labs  Lab 01/21/23 1055 01/22/23 1738  01/23/23 0448  AST  --  45*  --   ALT  --  16  --   ALKPHOS  --  70  --   BILITOT  --  0.8  --   PROT  --  7.7  --   ALBUMIN 2.1* 2.2* 1.8*   CBC: Recent Labs  Lab 01/17/23 0742 01/22/23 1738 01/22/23 1746 01/23/23 0448  WBC 11.3* 19.5*  --  21.3*  NEUTROABS  --  16.2*  --   --   HGB 8.0* 9.4* 10.5* 8.5*  HCT 24.9* 30.9* 31.0* 27.2*  MCV 100.4* 104.0*  --  103.0*  PLT 328 488*  --  392   Blood Culture    Component Value Date/Time   SDES BLOOD RIGHT HAND 01/22/2023 1842   SPECREQUEST  01/22/2023 1842    BOTTLES DRAWN AEROBIC AND ANAEROBIC Blood Culture adequate volume   CULT PENDING 01/22/2023 1842   REPTSTATUS PENDING 01/22/2023 1842   Studies/Results: VAS [place] UPPER EXTREMITY VENOUS  DUPLEX  Result Date: 01/23/2023 UPPER VENOUS STUDY  Patient Name:  [name]  Date of Exam:   01/23/2023 Medical Rec #: [id number]        Accession #:    [id number] Date of Birth: November 01, 1946        Patient Gender: M Patient Age:   77 years Exam Location:  [facility] Procedure:      VAS [place] UPPER EXTREMITY VENOUS DUPLEX Referring Phys: Eddie Dibbles HOFFMAN --------------------------------------------------------------------------------  Indications: Edema Risk Factors: None identified. Limitations: Poor ultrasound/tissue interface, bandages and line. Comparison Study: No prior studies. Performing Technologist: Oliver Hum RVT  Examination Guidelines: A complete evaluation includes B-mode imaging, spectral Doppler, color Doppler, and power Doppler as needed of all accessible portions of each vessel. Bilateral testing is considered an integral part of a complete examination. Limited examinations for reoccurring indications may be performed as noted.  Right Findings: +----------+------------+---------+-----------+----------+-------+ RIGHT     CompressiblePhasicitySpontaneousPropertiesSummary +----------+------------+---------+-----------+----------+-------+ IJV           Full       Yes        Yes                      +----------+------------+---------+-----------+----------+-------+ Subclavian    Full       Yes       Yes                      +----------+------------+---------+-----------+----------+-------+ Axillary      Full       Yes       Yes                      +----------+------------+---------+-----------+----------+-------+ Brachial      Full       Yes       Yes                      +----------+------------+---------+-----------+----------+-------+ Radial        Full                                          +----------+------------+---------+-----------+----------+-------+ Ulnar         Full                                          +----------+------------+---------+-----------+----------+-------+ Cephalic      None                                   Acute  +----------+------------+---------+-----------+----------+-------+ Basilic       Full                                          +----------+------------+---------+-----------+----------+-------+ Thrombus detected in the cephalic vein is noted to only be in the mid forearm.  Left Findings: +----------+------------+---------+-----------+----------+-------+ LEFT      CompressiblePhasicitySpontaneousPropertiesSummary +----------+------------+---------+-----------+----------+-------+ Subclavian    Full       Yes       Yes                      +----------+------------+---------+-----------+----------+-------+  Summary:  Right: No evidence of deep vein thrombosis in the upper extremity. Findings consistent with acute superficial vein thrombosis involving the right cephalic vein.  Left: No evidence of thrombosis in the subclavian.  *See table(s) above for measurements and observations.    Preliminary    CT CHEST ABDOMEN PELVIS WO CONTRAST  Result Date: 01/23/2023 CLINICAL DATA:  Recent hospitalization for cholecystitis and sacral ulcer, now with sepsis EXAM: CT CHEST, ABDOMEN AND  PELVIS WITHOUT CONTRAST TECHNIQUE: Multidetector CT imaging of the chest, abdomen and pelvis was performed following the standard protocol without IV contrast. RADIATION DOSE REDUCTION: This exam was performed according to the departmental dose-optimization program which includes automated exposure control, adjustment of the mA and/or kV according to patient size and/or use of iterative reconstruction technique. COMPARISON:  CT abdomen/pelvis dated 12/26/2022 FINDINGS: CT CHEST FINDINGS Cardiovascular: The heart is normal in size. No pericardial effusion. No evidence of thoracic aortic aneurysm. Atherosclerotic calcifications of the aortic arch. Severe three-vessel coronary atherosclerosis. Right IJ venous catheter terminates in the upper right atrium. Mediastinum/Nodes: No suspicious mediastinal lymphadenopathy. Visualized thyroid is unremarkable. Lungs/Pleura: Mild bilateral lower lobe scarring/atelectasis. No focal consolidation. Evaluation lung parenchyma is constrained by respiratory motion. Within that constraint, there are no suspicious pulmonary nodules. No pleural effusion or pneumothorax. Musculoskeletal: Healing right posterior 11th rib fracture (series 3/image 66). Moderate degenerative changes of the mid/lower thoracic spine. Thoracic spine stimulator. CT ABDOMEN PELVIS FINDINGS Hepatobiliary: Scattered small hepatic cysts, unchanged. Status post cholecystectomy. No intrahepatic or extrahepatic ductal dilatation. Pancreas: Within normal limits. Spleen: Within normal limits. Adrenals/Urinary Tract: Adrenal glands are within normal limits. Bilateral renal cysts, measuring up to 2.5 cm in the anterior interpolar right kidney (series 3/image 84), measuring simple fluid density, benign (Bosniak I). No follow-up is recommended. No renal calculi or hydronephrosis. Bladder is mildly thick-walled although underdistended. Stomach/Bowel: Stomach is within normal limits. No evidence of bowel obstruction. Normal  appendix (series 3/image 83). Mild eccentric rectal wall thickening (series 3/image 117), but new from the prior, favoring infectious/inflammatory colitis. Vascular/Lymphatic: No evidence of abdominal aortic aneurysm. Atherosclerotic calcifications of the abdominal aorta and branch vessels. No suspicious abdominopelvic lymphadenopathy. Reproductive: Prostate is unremarkable. Other: No abdominopelvic ascites. Musculoskeletal: Status post PLIF at L2-5. Degenerative changes of the lumbar spine. Large left paramidline sacral decubitus ulcer (series 3/image 117), progressive from the prior, with associated coccygeal osteomyelitis (series 3/image 120). IMPRESSION: Large left sacral decubitus ulcer, progressive from the prior, with associated coccygeal osteomyelitis. Mild eccentric rectal wall thickening, new from the prior, favoring infectious/inflammatory colitis. Healing right posterior 11th rib fracture. Otherwise, no acute cardiopulmonary abnormality. Additional ancillary findings as above. Electronically Signed   By: Julian Hy M.D.   On: 01/23/2023 03:58   CT Head Wo Contrast  Result Date: 01/23/2023 CLINICAL DATA:  Recent hospitalization for cholecystitis and sacral ulcer, now with sepsis EXAM: CT HEAD WITHOUT CONTRAST TECHNIQUE: Contiguous axial images were obtained from the base of the skull through the vertex without intravenous contrast. RADIATION DOSE REDUCTION: This exam was performed according to the departmental dose-optimization program which includes automated exposure control, adjustment of the mA and/or kV according to patient size and/or use of iterative reconstruction technique. COMPARISON:  11/04/2022 FINDINGS: Brain: No evidence of acute infarction, hemorrhage, hydrocephalus, extra-axial collection or mass lesion/mass effect. Global cortical atrophy. Subcortical white matter and periventricular small vessel ischemic changes. Vascular: Intracranial atherosclerosis. Skull: Normal. Negative  for fracture or focal lesion. Sinuses/Orbits: The visualized paranasal sinuses are essentially clear. The mastoid air cells are unopacified.  Other: None. IMPRESSION: No evidence of acute intracranial abnormality. Atrophy with small vessel ischemic changes. Electronically Signed   By: Julian Hy M.D.   On: 01/23/2023 03:48   DG Chest Port 1 View  Result Date: 01/22/2023 CLINICAL DATA:  Altered mental status EXAM: PORTABLE CHEST 1 VIEW COMPARISON:  Chest x-ray dated November 04, 2022 FINDINGS: Cardiac and mediastinal contours are unchanged. Right central venous catheter is unchanged in position. Right costophrenic angle is excluded from field of view. Visualized lungs are clear. The visualized skeletal structures are unremarkable. IMPRESSION: No active disease. Electronically Signed   By: Yetta Glassman M.D.   On: 01/22/2023 18:01    Medications:  sodium chloride 10 mL/hr at 01/23/23 1300   lactated ringers 100 mL/hr at 01/23/23 1300   norepinephrine (LEVOPHED) Adult infusion 3 mcg/min (01/23/23 1300)   piperacillin-tazobactam (ZOSYN)  IV Stopped (01/23/23 0657)    (feeding supplement) PROSource Plus  30 mL Oral BID BM   acetaminophen  1,000 mg Oral Q8H   aspirin EC  81 mg Oral Daily   atorvastatin  40 mg Oral Daily   [START ON 01/26/2023] calcitRIOL  0.25 mcg Oral Q Sun   Chlorhexidine Gluconate Cloth  6 each Topical Daily   feeding supplement  237 mL Oral TID BM   gabapentin  100 mg Oral TID   heparin  5,000 Units Subcutaneous Q8H   [START ON 01/24/2023] midodrine  10 mg Oral Q M,W,F-HD   multivitamin  1 tablet Oral QHS   pantoprazole (PROTONIX) IV  40 mg Intravenous Daily   sodium hypochlorite   Irrigation Q1200    Dialysis Orders: [facility] MWF 4hr, 400/800, EDW 83kg, 3K/2.25Ca, R TDC, heparin 4000 unit bolus q HD - Full 4hr HD 1/31, was below new EDW - left 78.6kg - Mircera 225 mcg IV q 2 weeks (due 2/2, last given Aranesp 220mg on 1/26) - no VDRA  Assessment/Plan: 1. Septic  shock: Blood Cx 1/31 growing GNR - for line holiday. CT chest/abd pending. Om Vanc/Zosyn + pressors. 2. ESRD: Usual MWF schedule, s/p full HD 1/31 - next would be due for HD on 2/2 - will likely need temp line placed, possibly can push to Saturday - will follow labs to decide. 3. HypoTN/volume: Currently on pressors, wean per ICU team. Gets midodrine pre-HD.  4. Anemia: Hgb down to 8.5 - ESA due tomorrow, ordered. 5. Secondary hyperparathyroidism: CorrCa still a little high, d/c VDRA. Phos stable without binders. 6. Nutrition: Alb very low, continue protein supplements. 7. Stage 4 sacral ulcer: S/p hydrotherapy and abx during recent admit 8. Chronic cholecystitis s/p cholecystectomy 9. HFrEF 10. T2DM 11. GOC: Palliative services previously engaged, family wanting full scope of care.  KVeneta Penton PA-C 01/23/2023, 1:30 PM  [facility]  Patient seen and examined, agree with above note with above modifications. Chronically ill BM- more in the hospital than out the last few months. Now with GNR bacteremia -  responded clinically to abx-  TDC removed.   Will need either replacement of tunneled line or temp line- could probably wait til Saturday for next HD but will check labs tomorrow to decide  KCorliss Parish MD 01/23/2023

## 2023-01-23 NOTE — H&P (Signed)
NAME:  [name], MRN:  [id number], DOB:  1946-09-22, LOS: 1 ADMISSION DATE:  01/22/2023, CONSULTATION DATE:  2/1 REFERRING MD:  Dr. Rogene Houston EDP Forestine Na, CHIEF COMPLAINT:  Hypotension   History of Present Illness:  77 year old male with past medical history as below, which is significant for ESRD on dialysis, heart failure with reduced EF, diabetes mellitus, paroxysmal atrial fib, and coronary artery disease.  He has been bedbound for approximately the last year and has a known large sacral decub with tunneling.  He has a recent admission to [facility] health on 12/21.  He had previously been diagnosed with cholecystitis and underwent cholecystostomy.  Tube had become dislodged and fortunately the bile duct had recanalized so tube was not replaced.  He was admitted on 12/21 for inpatient workup prior to cholecystectomy.  He underwent left heart cath and was found to have multivessel CAD without critical ischemia.  He underwent cholecystectomy on 12/28.  Hospitalization complicated by decubitus ulcer POA, which was found to be infected with E. coli and E faecalis status post treatment with antibiotics.  No abscess or osteomyelitis on CT and therefore surgery was not indicated.  Palliative care was consulted during the admission and the patient/family continues to wish for full scope with possible aggressive measures.  Course also complicated by malnutrition.  The patient did briefly require core track for nutritional needs, but was ultimately able to be nutritional goals by p.o. alone.  Discharged 1/30.  He presented to [facility] emergency department 1/31 from dialysis with complaint of low blood pressure.  It is unknown whether or not he received dialysis prior to being sent.  There was also concern of full body shaking.  Upon arrival to the emergency department he was minimally responsive but did respond to sternal rub and was ultimately able to arouse and answer some questions.  He was found to be  tachycardic, febrile, tachypneic, and hypotensive.  Initially responded to IV fluid resuscitation.  Initial lactic acid 5.5 and increased to 6.4.  Laboratory evaluation significant for WBC 19.5.  He became hypotensive again and was ultimately started on norepinephrine infusion.  His presentation was felt to be consistent with septic shock and he was initiated on empiric antibiotics.  PCCM was asked to admit to ICU at [facility].  Pertinent  Medical History   has a past medical history of Anginal pain ([place]), Anxiety, Benign prostatic hypertrophy, CAD (coronary artery disease), Cardiomyopathy ([place]), Degenerative joint disease, Depression, Diabetes mellitus, type II ([place]) (12/10/2012), Erectile dysfunction, ESRD (end stage renal disease) ([place]), Flu (2013), GERD (gastroesophageal reflux disease), Gout, Hemorrhoids, High cholesterol, Hypertension, Myocardial infarction ([place]), Neuropathy, NSVT (nonsustained ventricular tachycardia) ([place]), Obesity, PAF (paroxysmal atrial fibrillation) ([place]), Spinal cord stimulator, Tubular adenoma (2014), and Vertigo.   Significant Hospital Events: Including procedures, antibiotic start and stop dates in addition to other pertinent events   1/21 > 1/30 admit for cholecystectomy 1/33 admit for shock  Interim History / Subjective:    Objective   Blood pressure 96/60, pulse (!) 109, temperature (!) 100.6 F (38.1 C), temperature source Rectal, resp. rate (!) 25, height '6\' 3"'$  (1.905 m), weight 83 kg, SpO2 94 %.        Intake/Output Summary (Last 24 hours) at 01/23/2023 0041 Last data filed at 01/22/2023 2258 Gross per 24 hour  Intake 1536.42 ml  Output --  Net 1536.42 ml   Filed Weights   01/22/23 1809  Weight: 83 kg    Examination: General: Cachectic elderly appearing gentleman  in NAD HENT: [place]/AT, PERRL, mucous membranes dry Lungs: Clear bilateral breath sounds Cardiovascular: Tachy regular no MRG. No edema.  Abdomen: Sunken, soft, non-tender.  Extremities:  No acute deformity.  Neuro: Awake, alert, follows commands. Not answering orientation questions. Just says yes.   Resolved Hospital Problem list     Assessment & Plan:   Septic shock:  - Admit to ICU - NE as needed to keep MAP 65. Currently requiring 2 mcg intermittently.  - Source not clear. Cultures pending - CT chest, abdomen, pelvis given recent history of chronic cholecystitis and cholecystectomy.  - Empiric zosyn, vancomycin - Resume home midodrine when taking PO  Lactic acidosis - BP support - Trend lactic  Chronic HFrEF - LVEF recently improved from 35% to 55% PAF CAD - extensive workup last admission including LHC. Multivessel CAD with no significant stenosis.  - Hold isosorbide  Sacral decub POA - WOC consult  ESRD on HD  - scheduled MWF - Unclear from history whether he had HD 1/31, but labs would indicate he did - Call nephrology in AM  DM2 - CBG monitoring and SSI  Moderate protein calorie malnutrition - If mental status doesn't improve with pressors and ABX we may need to place cortrak for nutrition. Currently I don't feel comfortable with him taking PO.   RUE edema - doppler  Failure to thrive Functional quadriplegia - Palliative has been involved recently and signed off. Patient and family are very clear for full code and scope.    Best Practice (right click and "Reselect all SmartList Selections" daily)   Diet/type: NPO DVT prophylaxis: prophylactic heparin  GI prophylaxis: PPI Lines: Dialysis Catheter Foley:  N/A Code Status:  full code Last date of multidisciplinary goals of care discussion '[ ]'$   Labs   CBC: Recent Labs  Lab 01/16/23 0544 01/17/23 0742 01/22/23 1738 01/22/23 1746  WBC 11.7* 11.3* 19.5*  --   NEUTROABS  --   --  16.2*  --   HGB 9.3* 8.0* 9.4* 10.5*  HCT 28.7* 24.9* 30.9* 31.0*  MCV 100.7* 100.4* 104.0*  --   PLT 276 328 488*  --     Basic Metabolic Panel: Recent Labs  Lab 01/17/23 0742 01/20/23 0942  01/21/23 1055 01/22/23 1738 01/22/23 1746 01/22/23 1838  NA 134* 139 134* 138 135  --   K 3.2* 4.6 4.2 4.7 6.6*  --   CL 95* 99 95* 98 102  --   CO2 '28 25 27 22  '$ --   --   GLUCOSE 128* 139* 112* 129* 128* 179*  BUN 49* 62* 39* 18 23  --   CREATININE 4.32* 5.03* 3.03* 1.96* 2.00*  --   CALCIUM 10.0 10.4* 9.7 9.2  --   --   PHOS 4.6  --  3.7  --   --   --    GFR: Estimated Creatinine Clearance: 36.3 mL/min (A) (by C-G formula based on SCr of 2 mg/dL (H)). Recent Labs  Lab 01/16/23 0544 01/17/23 0742 01/22/23 1738 01/22/23 2005  WBC 11.7* 11.3* 19.5*  --   LATICACIDVEN  --   --  5.5* 6.4*    Liver Function Tests: Recent Labs  Lab 01/17/23 0742 01/21/23 1055 01/22/23 1738  AST  --   --  45*  ALT  --   --  16  ALKPHOS  --   --  70  BILITOT  --   --  0.8  PROT  --   --  7.7  ALBUMIN  2.0* 2.1* 2.2*   No results for input(s): "LIPASE", "AMYLASE" in the last 168 hours. No results for input(s): "AMMONIA" in the last 168 hours.  ABG    Component Value Date/Time   PHART 7.47 (H) 07/30/2022 0430   PCO2ART 40 07/30/2022 0430   PO2ART 75 (L) 07/30/2022 0430   HCO3 29.1 (H) 07/30/2022 0430   TCO2 25 01/22/2023 1746   O2SAT 97.8 07/30/2022 0430     Coagulation Profile: Recent Labs  Lab 01/22/23 1838  INR 1.3*    Cardiac Enzymes: No results for input(s): "CKTOTAL", "CKMB", "CKMBINDEX", "TROPONINI" in the last 168 hours.  HbA1C: Hgb A1c MFr Bld  Date/Time Value Ref Range Status  12/10/2022 11:22 AM 4.4 (L) 4.8 - 5.6 % Final    Comment:    (NOTE)         Prediabetes: 5.7 - 6.4         Diabetes: >6.4         Glycemic control for adults with diabetes: <7.0   06/04/2022 02:11 PM 6.1 (H) 4.8 - 5.6 % Final    Comment:    (NOTE) Pre diabetes:          5.7%-6.4%  Diabetes:              >6.4%  Glycemic control for   <7.0% adults with diabetes     CBG: Recent Labs  Lab 01/21/23 0746 01/22/23 1735 01/22/23 1854 01/22/23 1925 01/23/23 0009  GLUCAP 91  108* 129* 95 104*    Review of Systems:   Patient is encephalopathic and/or intubated. Therefore history has been obtained from chart review.    Past Medical History:  He,  has a past medical history of Anginal pain ([place]), Anxiety, Benign prostatic hypertrophy, CAD (coronary artery disease), Cardiomyopathy ([place]), Degenerative joint disease, Depression, Diabetes mellitus, type II ([place]) (12/10/2012), Erectile dysfunction, ESRD (end stage renal disease) ([place]), Flu (2013), GERD (gastroesophageal reflux disease), Gout, Hemorrhoids, High cholesterol, Hypertension, Myocardial infarction ([place]), Neuropathy, NSVT (nonsustained ventricular tachycardia) ([place]), Obesity, PAF (paroxysmal atrial fibrillation) ([place]), Spinal cord stimulator, Tubular adenoma (2014), and Vertigo.   Surgical History:   Past Surgical History:  Procedure Laterality Date   BACK SURGERY  2007   x2   BIOPSY  02/05/2021   Procedure: BIOPSY;  Surgeon: Daneil Dolin, MD;  [facility];  Service: Endoscopy;;   CARDIAC CATHETERIZATION     with stent placement   CHOLECYSTECTOMY N/A 12/19/2022   Procedure: LAPAROSCOPIC CHOLECYSTECTOMY;  Surgeon: Stark Klein, MD;  Location: [place];  Service: General;  Laterality: N/A;   COLONOSCOPY WITH ESOPHAGOGASTRODUODENOSCOPY (EGD) N/A 11/23/2013   Dr. Oneida Alar- TCS= left sided colitis and mild proctitis likely due to ischemic colitis, moderate internal hemorrhoids, small nodule in the rectum, bx=tubular adenoma, EGD=-mild non-erosive gastritis   COLONOSCOPY WITH PROPOFOL N/A 07/24/2020   hemorrhoids,  rectosigmoid lipoma, small adenoma removed.   CORONARY ANGIOPLASTY     ESOPHAGOGASTRODUODENOSCOPY (EGD) WITH PROPOFOL N/A 02/05/2021   Normal esophagus, erythematous mucosa in entire stomach, no obvious ulcer, s/p biopsy. Normal duodenum. Negative H.pylori.    HERNIA REPAIR     INSERTION OF DIALYSIS CATHETER N/A 08/06/2022   Procedure: INSERTION OF DIALYSIS CATHETER;  Surgeon: Rosetta Posner, MD;  Location: [facility];  Service: Vascular;  Laterality: N/A;   IR EXCHANGE BILIARY DRAIN  10/17/2022   IR PERC CHOLECYSTOSTOMY  08/20/2022   JOINT REPLACEMENT     LEFT HEART CATH AND CORONARY ANGIOGRAPHY N/A 12/17/2022   Procedure: LEFT  HEART CATH AND CORONARY ANGIOGRAPHY;  Surgeon: Leonie Man, MD;  Location: [place] CV LAB;  Service: Cardiovascular;  Laterality: N/A;   LEFT HEART CATHETERIZATION WITH CORONARY ANGIOGRAM N/A 11/16/2012   Procedure: LEFT HEART CATHETERIZATION WITH CORONARY ANGIOGRAM;  Surgeon: Sherren Mocha, MD;  Location: [facility] CATH LAB;  Service: Cardiovascular;  Laterality: N/A;   PERCUTANEOUS CORONARY STENT INTERVENTION (PCI-S)  11/16/2012   Procedure: PERCUTANEOUS CORONARY STENT INTERVENTION (PCI-S);  Surgeon: Sherren Mocha, MD;  Location: [facility] CATH LAB;  Service: Cardiovascular;;   POLYPECTOMY  07/24/2020   Procedure: POLYPECTOMY;  Surgeon: Daneil Dolin, MD;  [facility];  Service: Endoscopy;;   SPINAL CORD STIMULATOR INSERTION N/A 06/10/2022   Procedure: Spinal cord stimulator placement;  Surgeon: Newman Pies, MD;  Location: [place];  Service: Neurosurgery;  Laterality: N/A;  RM 19/ to follow   THUMB AMPUTATION Left    50 years ago   TOTAL KNEE ARTHROPLASTY Right 11/15/2013   Procedure: RIGHT TOTAL KNEE ARTHROPLASTY;  Surgeon: Mauri Pole, MD;  Location: [facility];  Service: Orthopedics;  Laterality: Right;   TOTAL KNEE ARTHROPLASTY Left 03/07/2015   Procedure: LEFT TOTAL KNEE ARTHROPLASTY;  Surgeon: Paralee Cancel, MD;  Location: [facility];  Service: Orthopedics;  Laterality: Left;     Social History:   reports that he quit smoking about 30 years ago. His smoking use included cigarettes. He started smoking about 58 years ago. He has a 30.00 pack-year smoking history. He has never used smokeless tobacco. He reports that he does not drink alcohol and does not use drugs.   Family History:  His family history includes CAD in his brother; Cancer in his  father and another family member; Diabetes in an other family member; Heart disease in an other family member; Hypertension in his mother and another family member. There is no history of Colon cancer.   Allergies Allergies  Allergen Reactions   Metformin Nausea And Vomiting   Niacin    Niacin And Related Other (See Comments)    Reaction: flushing and burning side effects even with extended release     Home Medications  Prior to Admission medications   Medication Sig Start Date End Date Taking? Authorizing Provider  acetaminophen (TYLENOL) 500 MG tablet Take 2 tablets (1,000 mg total) by mouth every 6 (six) hours. 08/10/22  Yes Shahmehdi, Seyed A, MD  atorvastatin (LIPITOR) 40 MG tablet Take 1 tablet (40 mg total) by mouth daily. 01/10/23  Yes Bonnielee Haff, MD  Darbepoetin Alfa (ARANESP) 200 MCG/0.4ML SOSY injection Inject 0.4 mLs (200 mcg total) into the skin every Friday at 6 PM. 01/10/23  Yes Bonnielee Haff, MD  feeding supplement (ENSURE ENLIVE / ENSURE PLUS) LIQD Take 237 mLs by mouth 3 (three) times daily with meals. 01/09/23  Yes Bonnielee Haff, MD  gabapentin (NEURONTIN) 100 MG capsule Take 1 capsule (100 mg total) by mouth 3 (three) times daily. 01/09/23  Yes Bonnielee Haff, MD  isosorbide dinitrate (ISORDIL) 5 MG tablet Take 1 tablet (5 mg total) by mouth 2 (two) times daily. 01/09/23  Yes Bonnielee Haff, MD  leptospermum manuka honey (MEDIHONEY) PSTE paste Apply 1 Application topically daily. 01/10/23  Yes Bonnielee Haff, MD  melatonin 5 MG TABS Take 5 mg by mouth at bedtime.   Yes [provider]  midodrine (PROAMATINE) 10 MG tablet Take 1 tablet (10 mg total) by mouth every Monday, Wednesday, and Friday with hemodialysis. 01/22/23  Yes Shawna Clamp, MD  multivitamin (RENA-VIT) TABS tablet Take 1  tablet by mouth at bedtime. 01/09/23  Yes Bonnielee Haff, MD  Nystatin (GERHARDT'S BUTT CREAM) CREA Apply 1 Application topically 2 (two) times daily. 01/09/23  Yes Bonnielee Haff, MD  oxyCODONE (OXY IR/ROXICODONE) 5 MG immediate release tablet Take 0.5-1 tablets (2.5-5 mg total) by mouth every 4 (four) hours as needed for moderate pain. 01/16/23  Yes Bonnielee Haff, MD  polyethylene glycol (MIRALAX / GLYCOLAX) 17 g packet Take 17 g by mouth 2 (two) times daily. 01/16/23  Yes Bonnielee Haff, MD  prochlorperazine (COMPAZINE) 5 MG tablet Take 1 tablet (5 mg total) by mouth every 6 (six) hours as needed for nausea or vomiting. 01/16/23  Yes Bonnielee Haff, MD  SANTYL 250 UNIT/GM ointment Apply topically daily. 09/21/22  Yes [provider]  senna-docusate (SENOKOT-S) 8.6-50 MG tablet Take 2 tablets by mouth 2 (two) times daily. 01/09/23  Yes Bonnielee Haff, MD  allopurinol (ZYLOPRIM) 100 MG tablet Take 100 mg by mouth daily. Patient not taking: Reported on 01/22/2023 08/17/20   [provider]  amoxicillin-clavulanate (AUGMENTIN) 500-125 MG tablet Take 1 tablet by mouth daily at 6 PM for 7 days. 01/16/23 01/23/23  Bonnielee Haff, MD  aspirin EC 81 MG tablet Take 1 tablet (81 mg total) by mouth daily. Patient not taking: Reported on 01/22/2023 09/14/19   Herminio Commons, MD  calcitRIOL (ROCALTROL) 0.25 MCG capsule Take 0.25 mcg by mouth every Sunday.  Patient not taking: Reported on 01/22/2023 12/13/16   [provider]  carboxymethylcellulose (REFRESH PLUS) 0.5 % SOLN Place 1 drop into both eyes daily as needed (dry eyes).    [provider]  meclizine (ANTIVERT) 12.5 MG tablet Take 1 tablet (12.5 mg total) by mouth 3 (three) times daily as needed for dizziness. 09/16/22   Manuella Ghazi, Pratik D, DO  RESTASIS 0.05 % ophthalmic emulsion Place 1 drop into both eyes 2 (two) times daily. Patient not taking: Reported on 01/22/2023 06/19/22   [provider]  simethicone (MYLICON) 80 MG chewable tablet Chew 80 mg by mouth every 6 (six) hours as needed for flatulence.    [provider]  TRUE METRIX BLOOD GLUCOSE TEST test strip USE TO  TEST BLOOD SUGAR UP TO TWICE DAILY OR AS DIRECTED 08/31/21   Renato Shin, MD     Critical care time: 13 minutes     Georgann Housekeeper, AGACNP-BC [facility]  See Amion for personal pager PCCM on call pager [phone] until 7pm. Please call Elink 7p-7a. [phone]  01/23/2023 1:20 AM

## 2023-01-23 NOTE — Progress Notes (Addendum)
[place] Progress Note Patient Name: SHOTA KOHRS DOB: Aug 19, 1946 MRN: [id number]   Date of Service  01/23/2023  HPI/Events of Note  ESRD on hemodialysis Monday Wednesday Friday, HFrEF, diabetes mellitus type 2, hypertension  - Stage 4 Decub  Presents with septic shock.  S/p 2.5L bolus, Vanc+cefepime+Flagyl  Tachy and normotensive on 6 of NE.   ** Camera in unavailable (Offline)  eICU Interventions  CT head, C/A/P pending Blood cx sent Known decub ulcer, unclear whether this needs debridement, can eval for necrotizing infection with CT.   Ground team to eval; will be available as needed, but no acute interventions indicated.    0518 - persistent fever, Added additional tylenol IV.   Intervention Category Evaluation Type: New Patient Evaluation  Osker Ayoub 01/23/2023, 12:16 AM

## 2023-01-23 NOTE — Progress Notes (Signed)
PHARMACY - PHYSICIAN COMMUNICATION CRITICAL VALUE ALERT - BLOOD CULTURE IDENTIFICATION (BCID)  [name] is an 77 y.o. male who presented to [facility] on 01/22/2023 with a chief complaint of septic shouck. He has a large decubitus ulcer which is presumed to be the source of infection.  Assessment:  Second blood culture set from 1/31 1842 growing Enterobacterales but no genus/species on the BCID. Very strange as this was drawn only 20 minutes after cx that grew Ecoli.   Name of physician (or Provider) Contacted: Dr. Lucile Shutters  Current antibiotics: Zosyn  Changes to prescribed antibiotics recommended:  Continue current antibiotics for now and f/u cultures for speciation  Results for orders placed or performed during the hospital encounter of 01/22/23  Blood Culture ID Panel (Reflexed) (Collected: 01/22/2023  6:42 PM)  Result Value Ref Range   Enterococcus faecalis NOT DETECTED NOT DETECTED   Enterococcus Faecium NOT DETECTED NOT DETECTED   Listeria monocytogenes NOT DETECTED NOT DETECTED   Staphylococcus species NOT DETECTED NOT DETECTED   Staphylococcus aureus (BCID) NOT DETECTED NOT DETECTED   Staphylococcus epidermidis NOT DETECTED NOT DETECTED   Staphylococcus lugdunensis NOT DETECTED NOT DETECTED   Streptococcus species NOT DETECTED NOT DETECTED   Streptococcus agalactiae NOT DETECTED NOT DETECTED   Streptococcus pneumoniae NOT DETECTED NOT DETECTED   Streptococcus pyogenes NOT DETECTED NOT DETECTED   A.calcoaceticus-baumannii NOT DETECTED NOT DETECTED   Bacteroides fragilis NOT DETECTED NOT DETECTED   Enterobacterales DETECTED (A) NOT DETECTED   Enterobacter cloacae complex NOT DETECTED NOT DETECTED   Escherichia coli NOT DETECTED NOT DETECTED   Klebsiella aerogenes NOT DETECTED NOT DETECTED   Klebsiella oxytoca NOT DETECTED NOT DETECTED   Klebsiella pneumoniae NOT DETECTED NOT DETECTED   Proteus species NOT DETECTED NOT DETECTED   Salmonella species NOT DETECTED NOT  DETECTED   Serratia marcescens NOT DETECTED NOT DETECTED   Haemophilus influenzae NOT DETECTED NOT DETECTED   Neisseria meningitidis NOT DETECTED NOT DETECTED   Pseudomonas aeruginosa NOT DETECTED NOT DETECTED   Stenotrophomonas maltophilia NOT DETECTED NOT DETECTED   Candida albicans NOT DETECTED NOT DETECTED   Candida auris NOT DETECTED NOT DETECTED   Candida glabrata NOT DETECTED NOT DETECTED   Candida krusei NOT DETECTED NOT DETECTED   Candida parapsilosis NOT DETECTED NOT DETECTED   Candida tropicalis NOT DETECTED NOT DETECTED   Cryptococcus neoformans/gattii NOT DETECTED NOT DETECTED   CTX-M ESBL NOT DETECTED NOT DETECTED   Carbapenem resistance IMP NOT DETECTED NOT DETECTED   Carbapenem resistance KPC NOT DETECTED NOT DETECTED   Carbapenem resistance NDM NOT DETECTED NOT DETECTED   Carbapenem resist OXA 48 LIKE NOT DETECTED NOT DETECTED   Carbapenem resistance VIM NOT DETECTED NOT DETECTED    Sherlon Handing, PharmD, BCPS Please see amion for complete clinical pharmacist phone list 01/23/2023  7:57 PM

## 2023-01-23 NOTE — Progress Notes (Signed)
PHARMACY - PHYSICIAN COMMUNICATION CRITICAL VALUE ALERT - BLOOD CULTURE IDENTIFICATION (BCID)    Assessment:  [name] is an 77 y.o. male with septic shouck. He has a large decubitus ulcer which is presumed to be the source of infection with a wound culture from 12/25/22 with E coli, proteus mirabilis and VRE (amp-S). He received augmentin 1/24>>1/30. The E coli isolate from the 1/4 culture was intermediate to amp-sul. 1/31 Bcx with 2/2 GNRs>>E coli, with no resistance mechanisms identified on BCID.   Name of physician (or Provider) Contacted: Janene Madeira, NP and Gaylan Gerold, MD    Current antibiotics: Zosyn 2.25 g IV Q8H   Changes to prescribed antibiotics recommended:  Patient is on recommended antibiotics - No changes needed  Results for orders placed or performed during the hospital encounter of 07/28/22  Blood Culture ID Panel (Reflexed) (Collected: 08/01/2022  4:54 PM)  Result Value Ref Range   Enterococcus faecalis NOT DETECTED NOT DETECTED   Enterococcus Faecium NOT DETECTED NOT DETECTED   Listeria monocytogenes NOT DETECTED NOT DETECTED   Staphylococcus species DETECTED (A) NOT DETECTED   Staphylococcus aureus (BCID) NOT DETECTED NOT DETECTED   Staphylococcus epidermidis DETECTED (A) NOT DETECTED   Staphylococcus lugdunensis NOT DETECTED NOT DETECTED   Streptococcus species NOT DETECTED NOT DETECTED   Streptococcus agalactiae NOT DETECTED NOT DETECTED   Streptococcus pneumoniae NOT DETECTED NOT DETECTED   Streptococcus pyogenes NOT DETECTED NOT DETECTED   A.calcoaceticus-baumannii NOT DETECTED NOT DETECTED   Bacteroides fragilis NOT DETECTED NOT DETECTED   Enterobacterales NOT DETECTED NOT DETECTED   Enterobacter cloacae complex NOT DETECTED NOT DETECTED   Escherichia coli NOT DETECTED NOT DETECTED   Klebsiella aerogenes NOT DETECTED NOT DETECTED   Klebsiella oxytoca NOT DETECTED NOT DETECTED   Klebsiella pneumoniae NOT DETECTED NOT DETECTED   Proteus species NOT  DETECTED NOT DETECTED   Salmonella species NOT DETECTED NOT DETECTED   Serratia marcescens NOT DETECTED NOT DETECTED   Haemophilus influenzae NOT DETECTED NOT DETECTED   Neisseria meningitidis NOT DETECTED NOT DETECTED   Pseudomonas aeruginosa NOT DETECTED NOT DETECTED   Stenotrophomonas maltophilia NOT DETECTED NOT DETECTED   Candida albicans NOT DETECTED NOT DETECTED   Candida auris NOT DETECTED NOT DETECTED   Candida glabrata NOT DETECTED NOT DETECTED   Candida krusei NOT DETECTED NOT DETECTED   Candida parapsilosis NOT DETECTED NOT DETECTED   Candida tropicalis NOT DETECTED NOT DETECTED   Cryptococcus neoformans/gattii NOT DETECTED NOT DETECTED   Methicillin resistance mecA/C DETECTED (A) NOT DETECTED    Adria Dill, PharmD PGY-2 Infectious Diseases Resident  01/23/2023 4:15 PM

## 2023-01-23 NOTE — Progress Notes (Signed)
Right upper extremity venous duplex has been completed. Preliminary results can be found in CV Proc through chart review.  Results were given to [place].  01/23/23 1:15 PM [name] RVT

## 2023-01-23 NOTE — Progress Notes (Signed)
Forestine Na RN called with positive blood culture results - gram negative rods in aerobic bottle. Gaylan Gerold, DO made aware.

## 2023-01-23 NOTE — Consult Note (Signed)
WOC Nurse Consult Note: Reason for Consult:stage 4 pressure injury, known to [facility] team.  Discharged 01/21/23 and returned to ED from dialysis due to altered mental status, hypotension.  Elevated WBC and lactic acid.  Awaiting CT scan pelvis to determine if wound is source of sepsis.  Wound type: stage 4 pressure injury.  Pressure Injury POA: Yes Measurement: 5 cm x 6 cm x 3 cm bone palpable.  Undermining circumferentially, extends 5 cm  Wound bed: slough and adherent devitalized tissue present Drainage (amount, consistency, odor) moderate serosanguinous with musty odor Periwound: scarring from previous lesions, some medical adhesive related skin injury from dressing removal. Dressing procedure/placement/frequency: Cleanse sacral wound with NS and pat dry. Apply Dakins moist gauze to wound bed. Cover with dry gauze and ABD pad/tape.  Would benefit from surgical consult. If you agree, please order.  Will not follow at this time.  Please re-consult if needed.  Estrellita Ludwig MSN, RN, FNP-BC [facility]ON Wound, Ostomy, Continence Nurse [facility] [phone] Pager [phone]

## 2023-01-23 NOTE — Consult Note (Signed)
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  Plan:  Narrowed to Zosyn Follow-up blood cultures General surgery to see the patient along with wound care HD catheter needs to be removed and the patient have a line holiday   Principal Problem:   Septic shock ([place]) Active Problems:   End stage renal disease on dialysis ([place])   Altered mental status   Scheduled Meds:  (feeding supplement) PROSource Plus  30 mL Oral BID BM   acetaminophen  1,000 mg Oral Q8H   aspirin EC  81 mg Oral Daily   atorvastatin  40 mg Oral Daily   Chlorhexidine Gluconate Cloth  6 each Topical Daily   feeding supplement  237 mL Oral TID BM   gabapentin  100 mg Oral TID   heparin  5,000 Units Subcutaneous Q8H   [START ON 01/24/2023] midodrine  10 mg Oral Q M,W,F-HD   multivitamin  1 tablet Oral QHS   pantoprazole (PROTONIX) IV  40 mg Intravenous Daily   sodium hypochlorite   Irrigation Q1200   Continuous Infusions:  sodium chloride 10 mL/hr at 01/23/23 1300   lactated ringers 100 mL/hr at 01/23/23 1300   norepinephrine (LEVOPHED) Adult infusion 3 mcg/min (01/23/23 1300)   piperacillin-tazobactam (ZOSYN)  IV Stopped (01/23/23 0657)   PRN Meds:.sodium chloride  HPI: [name] is a 77 y.o. male with heart failure, DM, PAF, CAD, eSRD On HD with chronic large sacral decubitus ulcer with underlying chronic coccygeal osteomyelitis.  He had been recently mated to [facility] on December 21.  During that admission he was diagnosed with cholecystitis underwent cholecystostomy.  His tube became  dislodged but fortunately bile duct had recanalized and it did not need replaced.  He then underwent a cystectomy on December 19, 2022.  Decubitus ulcer worsened in the interim with cultures that yielded E. coli and Enterococcus faecalis that was ampicillin sensitive but vancomycin resistant.  She was discharged on January 30 if but then readmitted quickly on January 31 for being found with low blood pressures during hemodialysis.  In the ER he was minimally responsive was tachypneic febrile and tachycardic and hypotensive.  Had a lactic acidosis of 5.5-6.4.  Cultures were taken and he was started on vancomycin and Zosyn.  In the interim the cultures of come back positive for gram-negative rods.  CT chest abdomen pelvis has shown a known large sacral decubitus ulcer which is progressed compared to prior imaging along with associated coccygeal osteomyelitis with some mild eccentric rectal wall thickening and healing right posterior 11th rib fracture.  Will narrow him to Zosyn given only gram-negative organisms found in culture.  Will await surgical and wound care evaluation of the patient's chronic sacral wound.  Given his gram-negative bacteremia and sepsis I think his hemodialysis catheter needs to be removed and needs to have a line holiday.  I spent 60mnutes with the patient including than 50% of the time in face to face counseling of the patient already has gram-negative bacteremia and septic shock chronic sacral wound with coccygeal osteomyelitis personally reviewing CT head CT chest  abdomen and pelvis along with review of medical records in preparation for the visit and during the visit and in coordination of his care.      Review of Systems: Review of Systems  Constitutional:  Positive for fever. Negative for chills, malaise/fatigue and weight loss.  HENT:  Negative for congestion and sore throat.   Eyes:  Negative for blurred vision and photophobia.  Respiratory:  Negative for  cough, shortness of breath and wheezing.   Cardiovascular:  Positive for palpitations. Negative for chest pain and leg swelling.  Gastrointestinal:  Negative for abdominal pain, blood in stool, constipation, diarrhea, heartburn, melena, nausea and vomiting.  Genitourinary:  Negative for dysuria, flank pain and hematuria.  Musculoskeletal:  Negative for back pain, falls, joint pain and myalgias.  Skin:  Negative for itching and rash.  Neurological:  Positive for weakness. Negative for dizziness, focal weakness, loss of consciousness and headaches.  Endo/Heme/Allergies:  Does not bruise/bleed easily.  Psychiatric/Behavioral:  Negative for depression and suicidal ideas. The patient does not have insomnia.     Past Medical History:  Diagnosis Date   Anginal pain ([place])    Anxiety    Benign prostatic hypertrophy    Nocturia   CAD (coronary artery disease)    a.  NSTEMI 10/2012 s/p DES to LAD & DES to 1st diagonal with residual diffuse nonobstructive dz in LCx/RCA; b. 04/2022 MV: EF 55%, prior inferior MI. Small apical infarct w/ mild peri-infarct ischemia.   Cardiomyopathy ([place])    a. 03/2009 Echo: EF 50-55%; b. 01/2022 Echo: EF 60-65%; c. 07/2022 Echo: EF 35-40%, glob HK, worse @ apex. In Afib during study.   Degenerative joint disease    Depression    hx of   Diabetes mellitus, type II ([place]) 12/10/2012   Erectile dysfunction    ESRD (end stage renal disease) ([place])    a. HD initiated 07/2022.   Flu 2013   hx of   GERD (gastroesophageal reflux disease)    Gout    Hemorrhoids    High cholesterol    Hypertension    Exercise induced   Myocardial infarction [facility])    Neuropathy    bilateral legs   NSVT (nonsustained ventricular tachycardia) ([facility])    Exercise induced   Obesity    PAF (paroxysmal atrial fibrillation) ([place])    a. CHA2DS2VASc = 6.  No OAC   Spinal cord stimulator    Tubular adenoma 2014   Vertigo    everyday    Social History   Tobacco Use   Smoking status: Former     Packs/day: 1.00    Years: 30.00    Total pack years: 30.00    Types: Cigarettes    Start date: 01/22/1965    Quit date: 12/23/1992    Years since quitting: 30.1   Smokeless tobacco: Never  Vaping Use   Vaping Use: Never used  Substance Use Topics   Alcohol use: No    Alcohol/week: 0.0 standard drinks of alcohol   Drug use: No    Family History  Problem Relation Age of Onset   Hypertension Other    Cancer Other    Heart disease Other    Diabetes Other    Hypertension Mother    Cancer Father        brain   CAD Brother    Colon cancer Neg Hx    Allergies  Allergen Reactions   Metformin Nausea And Vomiting   Niacin    Niacin And Related  Other (See Comments)    Reaction: flushing and burning side effects even with extended release    OBJECTIVE: Blood pressure 123/68, pulse (!) 54, temperature 98 F (36.7 C), temperature source Oral, resp. rate 18, height '6\' 3"'$  (1.905 m), weight 83 kg, SpO2 100 %.  Physical Exam Constitutional:      Appearance: He is ill-appearing.  HENT:     Head: Normocephalic and atraumatic.  Eyes:     General:        Right eye: No discharge.        Left eye: No discharge.     Extraocular Movements: Extraocular movements intact.  Cardiovascular:     Rate and Rhythm: Tachycardia present.     Heart sounds: No murmur heard.    No friction rub. No gallop.  Pulmonary:     Effort: Pulmonary effort is normal. No respiratory distress.     Breath sounds: Normal breath sounds. No stridor. No wheezing or rhonchi.  Abdominal:     General: Bowel sounds are normal. There is no distension.  Skin:    General: Skin is warm and dry.  Neurological:     Mental Status: He is alert and oriented to person, place, and time.  Psychiatric:        Attention and Perception: Attention normal.        Mood and Affect: Mood normal.        Behavior: Behavior normal.        Thought Content: Thought content normal.        Cognition and Memory: Cognition normal.         Judgment: Judgment normal.    Wound not examined though pictures in media reviewed  Lab Results Lab Results  Component Value Date   WBC 21.3 (H) 01/23/2023   HGB 8.5 (L) 01/23/2023   HCT 27.2 (L) 01/23/2023   MCV 103.0 (H) 01/23/2023   PLT 392 01/23/2023    Lab Results  Component Value Date   CREATININE 2.31 (H) 01/23/2023   BUN 18 01/23/2023   NA 134 (L) 01/23/2023   K 4.1 01/23/2023   CL 97 (L) 01/23/2023   CO2 25 01/23/2023    Lab Results  Component Value Date   ALT 16 01/22/2023   AST 45 (H) 01/22/2023   ALKPHOS 70 01/22/2023   BILITOT 0.8 01/22/2023     Microbiology: Recent Results (from the past 240 hour(s))  Resp panel by RT-PCR (RSV, Flu A&B, Covid) Anterior Nasal Swab     Status: None   Collection Time: 01/22/23  6:05 PM   Specimen: Anterior Nasal Swab  Result Value Ref Range Status   SARS Coronavirus 2 by RT PCR NEGATIVE NEGATIVE Final    Comment: (NOTE) SARS-CoV-2 target nucleic acids are NOT DETECTED.  The SARS-CoV-2 RNA is generally detectable in upper respiratory specimens during the acute phase of infection. The lowest concentration of SARS-CoV-2 viral copies this assay can detect is 138 copies/mL. A negative result does not preclude SARS-Cov-2 infection and should not be used as the sole basis for treatment or other patient management decisions. A negative result may occur with  improper specimen collection/handling, submission of specimen other than nasopharyngeal swab, presence of viral mutation(s) within the areas targeted by this assay, and inadequate number of viral copies(<138 copies/mL). A negative result must be combined with clinical observations, patient history, and epidemiological information. The expected result is Negative.  Fact Sheet for Patients:  EntrepreneurPulse.com.au  Fact Sheet for Healthcare Providers:  IncredibleEmployment.be  This test is no t yet approved or cleared by the [place] and  has been authorized for detection and/or diagnosis of SARS-CoV-2 by FDA under an Emergency Use Authorization (EUA). This EUA will remain  in effect (meaning this test can be used) for the duration of the COVID-19 declaration under Section 564(b)(1) of the Act, 21 U.S.C.section 360bbb-3(b)(1), unless the authorization is terminated  or revoked sooner.       Influenza A by PCR NEGATIVE NEGATIVE Final   Influenza B by PCR NEGATIVE NEGATIVE Final    Comment: (NOTE) The Xpert Xpress SARS-CoV-2/FLU/RSV plus assay is intended as an aid in the diagnosis of influenza from Nasopharyngeal swab specimens and should not be used as a sole basis for treatment. Nasal washings and aspirates are unacceptable for Xpert Xpress SARS-CoV-2/FLU/RSV testing.  Fact Sheet for Patients: EntrepreneurPulse.com.au  Fact Sheet for Healthcare Providers: IncredibleEmployment.be  This test is not yet approved or cleared by the [place] FDA and has been authorized for detection and/or diagnosis of SARS-CoV-2 by FDA under an Emergency Use Authorization (EUA). This EUA will remain in effect (meaning this test can be used) for the duration of the COVID-19 declaration under Section 564(b)(1) of the Act, 21 U.S.C. section 360bbb-3(b)(1), unless the authorization is terminated or revoked.     Resp Syncytial Virus by PCR NEGATIVE NEGATIVE Final    Comment: (NOTE) Fact Sheet for Patients: EntrepreneurPulse.com.au  Fact Sheet for Healthcare Providers: IncredibleEmployment.be  This test is not yet approved or cleared by the [place] FDA and has been authorized for detection and/or diagnosis of SARS-CoV-2 by FDA under an Emergency Use Authorization (EUA). This EUA will remain in effect (meaning this test can be used) for the duration of the COVID-19 declaration under Section 564(b)(1) of the Act, 21 U.S.C. section  360bbb-3(b)(1), unless the authorization is terminated or revoked.  Performed at [facility], [address] [place], [place] [address]   Culture, blood (Routine x 2)     Status: None (Preliminary result)   Collection Time: 01/22/23  6:25 PM   Specimen: BLOOD RIGHT HAND  Result Value Ref Range Status   Specimen Description   Final    BLOOD RIGHT HAND Performed at [facility], [address], [place], [place] [address]    Special Requests   Final    BOTTLES DRAWN AEROBIC ONLY Blood Culture adequate volume Performed at [facility], [address], [place], [place] [address]    Culture  Setup Time   Final    GRAM NEGATIVE RODS AEROBIC BOTTLE Gram Stain Report Called to,Read Back By and Verified With: BARBOUR,A '@0855'$  BY MATTHEWS, B 2.1.2024 Organism ID to follow Performed at [facility], [place] [address], [place],  [address]    Culture PENDING  Incomplete   Report Status PENDING  Incomplete  Culture, blood (Routine x 2)     Status: None (Preliminary result)   Collection Time: 01/22/23  6:42 PM   Specimen: BLOOD RIGHT HAND  Result Value Ref Range Status   Specimen Description BLOOD RIGHT HAND  Final   Special Requests   Final    BOTTLES DRAWN AEROBIC AND ANAEROBIC Blood Culture adequate volume   Culture  Setup Time   Final    IN BOTH AEROBIC AND ANAEROBIC BOTTLES GRAM NEGATIVE RODS CRITICAL VALUE NOTED.  VALUE IS CONSISTENT WITH PREVIOUSLY REPORTED AND CALLED VALUE. Performed at [facility], [address], [place],  [address]    Culture PENDING  Incomplete  Report Status PENDING  Incomplete  MRSA Next Gen by PCR, Nasal     Status: None   Collection Time: 01/22/23 11:58 PM   Specimen: Nasal Mucosa; Nasal Swab  Result Value Ref Range Status   MRSA by PCR Next Gen NOT DETECTED NOT DETECTED Final    Comment: (NOTE) The GeneXpert MRSA Assay (FDA approved for NASAL specimens only), is one component of a comprehensive MRSA colonization surveillance program. It  is not intended to diagnose MRSA infection nor to guide or monitor treatment for MRSA infections. Test performance is not FDA approved in patients less than 61 years old. Performed at [facility], [place] [address], [place],  [address]     Alcide Evener, [place] [facility] [place] [facility] [phone] pager  01/23/2023, 2:07 PM

## 2023-01-23 NOTE — Progress Notes (Signed)
Pharmacy Antibiotic Note  [name] is a 77 y.o. male admitted on 01/22/2023 with sepsis. Patient has ESRD on HD MWF. He was discharged after hospitalization for cholecystitis and sacral ulcer to SNF on 1/30 and readmitted 1/31 with hypotension, tachypnea, tachycardia and WBC up to 19.5 from 11.3.  Pharmacy has been consulted for vancomycin and zosyn dosing.  Plan: Vancomycin '1000mg'$  x1 then '1000mg'$  post HD MWF Zosyn 2.25g IV q8 hours Monitor cultures, clinical status, HD schedule/tolerance, vancomycin level Narrow abx as able and f/u duration  Height: '6\' 3"'$  (190.5 cm) Weight: 83 kg (182 lb 15.7 oz) IBW/kg (Calculated) : 84.5  Temp (24hrs), Avg:100.9 F (38.3 C), Min:100.2 F (37.9 C), Max:101.9 F (38.8 C)  Recent Labs  Lab 01/16/23 0544 01/17/23 0742 01/20/23 0942 01/21/23 1055 01/22/23 1738 01/22/23 1746 01/22/23 2005  WBC 11.7* 11.3*  --   --  19.5*  --   --   CREATININE  --  4.32* 5.03* 3.03* 1.96* 2.00*  --   LATICACIDVEN  --   --   --   --  5.5*  --  6.4*     Estimated Creatinine Clearance: 36.3 mL/min (A) (by C-G formula based on SCr of 2 mg/dL (H)).    Allergies  Allergen Reactions   Metformin Nausea And Vomiting   Niacin    Niacin And Related Other (See Comments)    Reaction: flushing and burning side effects even with extended release    Antimicrobials this admission: Vanc 1/31 >> Cefe 1/31 x1  MTZ 1/31 x1 Zosyn 2/1>>   Dose adjustments this admission: N/a  Microbiology results: 1/31 BCx: pend  12/26 MRSA PCR: neg  Thank you for allowing pharmacy to be a part of this patient's care.  Georga Bora, PharmD Clinical Pharmacist 01/23/2023 1:20 AM Please check AMION for all [place] numbers

## 2023-01-23 NOTE — Progress Notes (Signed)
Subjective: Patient known to our service as we just saw him on 1/25 for his wound with no recommendations for surgical intervention needed.  He has been readmitted secondary to septic shock of unclear etiology.  He underwent CT imaging which revealed a "large left sacral decubitus ulcer, progressive from the prior scan, with associated coccygeal osteomyelitis" and some mild rectal wall thickening c/w possible infectious/inflammatory colitis.  We have been asked to look at his wound to see if he needs any surgical debridement.    Objective: Vital signs in last 24 hours: Temp:  [98 F (36.7 C)-101.9 F (38.8 C)] 98 F (36.7 C) (02/01 1117) Pulse Rate:  [35-152] 71 (02/01 1115) Resp:  [18-36] 21 (02/01 1115) BP: (80-146)/(44-84) 132/62 (02/01 1115) SpO2:  [92 %-100 %] 100 % (02/01 1115) Weight:  [83 kg] 83 kg (01/31 1809) Last BM Date : 01/23/23  Intake/Output from previous day: 01/31 0701 - 02/01 0700 In: 6972.2 [I.V.:1952.1; IV Piggyback:5020.1] Out: -  Intake/Output this shift: Total I/O In: 710.7 [I.V.:660.7; IV Piggyback:50.1] Out: 0   PE: Gen: NAD, sleeping Skin: stage 4 sacral wound.  No soft tissue infection noted.  Base of the wound and edges with thin necrotic tissue.  Palpable bone at the base, but covered with a layer of periosteum.     Lab Results:  Recent Labs    01/22/23 1738 01/22/23 1746 01/23/23 0448  WBC 19.5*  --  21.3*  HGB 9.4* 10.5* 8.5*  HCT 30.9* 31.0* 27.2*  PLT 488*  --  392   BMET Recent Labs    01/22/23 1738 01/22/23 1746 01/22/23 1838 01/23/23 0448  NA 138 135  --  134*  K 4.7 6.6*  --  4.1  CL 98 102  --  97*  CO2 22  --   --  25  GLUCOSE 129* 128* 179* 112*  BUN 18 23  --  18  CREATININE 1.96* 2.00*  --  2.31*  CALCIUM 9.2  --   --  9.3   PT/INR Recent Labs    01/22/23 1838  LABPROT 15.7*  INR 1.3*   CMP     Component Value Date/Time   NA 134 (L) 01/23/2023 0448   NA 145 (H) 01/02/2021 1548   K 4.1 01/23/2023  0448   CL 97 (L) 01/23/2023 0448   CO2 25 01/23/2023 0448   GLUCOSE 112 (H) 01/23/2023 0448   BUN 18 01/23/2023 0448   BUN 35 (H) 01/02/2021 1548   CREATININE 2.31 (H) 01/23/2023 0448   CREATININE 2.49 (H) 09/18/2015 1359   CALCIUM 9.3 01/23/2023 0448   PROT 7.7 01/22/2023 1738   PROT 7.1 12/14/2020 1243   ALBUMIN 1.8 (L) 01/23/2023 0448   ALBUMIN 4.3 12/14/2020 1243   AST 45 (H) 01/22/2023 1738   ALT 16 01/22/2023 1738   ALKPHOS 70 01/22/2023 1738   BILITOT 0.8 01/22/2023 1738   BILITOT 0.4 12/14/2020 1243   GFRNONAA 28 (L) 01/23/2023 0448   GFRAA 40 (L) 01/02/2021 1548   Lipase     Component Value Date/Time   LIPASE 21 11/04/2022 0907       Studies/Results: CT CHEST ABDOMEN PELVIS WO CONTRAST  Result Date: 01/23/2023 CLINICAL DATA:  Recent hospitalization for cholecystitis and sacral ulcer, now with sepsis EXAM: CT CHEST, ABDOMEN AND PELVIS WITHOUT CONTRAST TECHNIQUE: Multidetector CT imaging of the chest, abdomen and pelvis was performed following the standard protocol without IV contrast. RADIATION DOSE REDUCTION: This exam was performed  according to the departmental dose-optimization program which includes automated exposure control, adjustment of the mA and/or kV according to patient size and/or use of iterative reconstruction technique. COMPARISON:  CT abdomen/pelvis dated 12/26/2022 FINDINGS: CT CHEST FINDINGS Cardiovascular: The heart is normal in size. No pericardial effusion. No evidence of thoracic aortic aneurysm. Atherosclerotic calcifications of the aortic arch. Severe three-vessel coronary atherosclerosis. Right IJ venous catheter terminates in the upper right atrium. Mediastinum/Nodes: No suspicious mediastinal lymphadenopathy. Visualized thyroid is unremarkable. Lungs/Pleura: Mild bilateral lower lobe scarring/atelectasis. No focal consolidation. Evaluation lung parenchyma is constrained by respiratory motion. Within that constraint, there are no suspicious pulmonary  nodules. No pleural effusion or pneumothorax. Musculoskeletal: Healing right posterior 11th rib fracture (series 3/image 66). Moderate degenerative changes of the mid/lower thoracic spine. Thoracic spine stimulator. CT ABDOMEN PELVIS FINDINGS Hepatobiliary: Scattered small hepatic cysts, unchanged. Status post cholecystectomy. No intrahepatic or extrahepatic ductal dilatation. Pancreas: Within normal limits. Spleen: Within normal limits. Adrenals/Urinary Tract: Adrenal glands are within normal limits. Bilateral renal cysts, measuring up to 2.5 cm in the anterior interpolar right kidney (series 3/image 84), measuring simple fluid density, benign (Bosniak I). No follow-up is recommended. No renal calculi or hydronephrosis. Bladder is mildly thick-walled although underdistended. Stomach/Bowel: Stomach is within normal limits. No evidence of bowel obstruction. Normal appendix (series 3/image 83). Mild eccentric rectal wall thickening (series 3/image 117), but new from the prior, favoring infectious/inflammatory colitis. Vascular/Lymphatic: No evidence of abdominal aortic aneurysm. Atherosclerotic calcifications of the abdominal aorta and branch vessels. No suspicious abdominopelvic lymphadenopathy. Reproductive: Prostate is unremarkable. Other: No abdominopelvic ascites. Musculoskeletal: Status post PLIF at L2-5. Degenerative changes of the lumbar spine. Large left paramidline sacral decubitus ulcer (series 3/image 117), progressive from the prior, with associated coccygeal osteomyelitis (series 3/image 120). IMPRESSION: Large left sacral decubitus ulcer, progressive from the prior, with associated coccygeal osteomyelitis. Mild eccentric rectal wall thickening, new from the prior, favoring infectious/inflammatory colitis. Healing right posterior 11th rib fracture. Otherwise, no acute cardiopulmonary abnormality. Additional ancillary findings as above. Electronically Signed   By: Julian Hy M.D.   On: 01/23/2023  03:58   CT Head Wo Contrast  Result Date: 01/23/2023 CLINICAL DATA:  Recent hospitalization for cholecystitis and sacral ulcer, now with sepsis EXAM: CT HEAD WITHOUT CONTRAST TECHNIQUE: Contiguous axial images were obtained from the base of the skull through the vertex without intravenous contrast. RADIATION DOSE REDUCTION: This exam was performed according to the departmental dose-optimization program which includes automated exposure control, adjustment of the mA and/or kV according to patient size and/or use of iterative reconstruction technique. COMPARISON:  11/04/2022 FINDINGS: Brain: No evidence of acute infarction, hemorrhage, hydrocephalus, extra-axial collection or mass lesion/mass effect. Global cortical atrophy. Subcortical white matter and periventricular small vessel ischemic changes. Vascular: Intracranial atherosclerosis. Skull: Normal. Negative for fracture or focal lesion. Sinuses/Orbits: The visualized paranasal sinuses are essentially clear. The mastoid air cells are unopacified. Other: None. IMPRESSION: No evidence of acute intracranial abnormality. Atrophy with small vessel ischemic changes. Electronically Signed   By: Julian Hy M.D.   On: 01/23/2023 03:48   DG Chest Port 1 View  Result Date: 01/22/2023 CLINICAL DATA:  Altered mental status EXAM: PORTABLE CHEST 1 VIEW COMPARISON:  Chest x-ray dated November 04, 2022 FINDINGS: Cardiac and mediastinal contours are unchanged. Right central venous catheter is unchanged in position. Right costophrenic angle is excluded from field of view. Visualized lungs are clear. The visualized skeletal structures are unremarkable. IMPRESSION: No active disease. Electronically Signed   By: Hosie Poisson.D.  On: 01/22/2023 18:01    Anti-infectives: Anti-infectives (From admission, onward)    Start     Dose/Rate Route Frequency Ordered Stop   01/24/23 1800  vancomycin (VANCOCIN) IVPB 1000 mg/200 mL premix  Status:  Discontinued         1,000 mg 200 mL/hr over 60 Minutes Intravenous Every M-W-F (Hemodialysis) 01/22/23 1820 01/23/23 1057   01/23/23 2000  ceFEPIme (MAXIPIME) 2 g in sodium chloride 0.9 % 100 mL IVPB  Status:  Discontinued        2 g 200 mL/hr over 30 Minutes Intravenous Every 24 hours 01/22/23 1820 01/23/23 0102   01/23/23 0600  piperacillin-tazobactam (ZOSYN) IVPB 2.25 g        2.25 g 100 mL/hr over 30 Minutes Intravenous Every 8 hours 01/23/23 0119     01/22/23 1815  ceFEPIme (MAXIPIME) 2 g in sodium chloride 0.9 % 100 mL IVPB        2 g 200 mL/hr over 30 Minutes Intravenous  Once 01/22/23 1804 01/22/23 1944   01/22/23 1815  metroNIDAZOLE (FLAGYL) IVPB 500 mg        500 mg 100 mL/hr over 60 Minutes Intravenous  Once 01/22/23 1804 01/22/23 2005   01/22/23 1815  vancomycin (VANCOCIN) IVPB 1000 mg/200 mL premix        1,000 mg 200 mL/hr over 60 Minutes Intravenous  Once 01/22/23 1804 01/22/23 2107        Assessment/Plan Stage 4 sacral wound The wound is not infected as far as his soft tissue.  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  Would defer treatment of this to the primary team/ID team if warranted.  Nothing surgical to do for osteo.  The would wound benefit from hydrotherapy and medihoney.  No acute surgical needs for debridement.  We are available if needed.   I reviewed hospitalist notes, last 24 h vitals and pain scores, last 48 h intake and output, last 24 h labs and trends, and last 24 h imaging results.   LOS: 1 day    Henreitta Cea , [facility] Surgery 01/23/2023, 11:38 AM Please see Amion for pager number during day hours 7:00am-4:30pm or 7:00am -11:30am on weekends

## 2023-01-23 NOTE — Progress Notes (Signed)
Initial Nutrition Assessment  DOCUMENTATION CODES:   Non-severe (moderate) malnutrition in context of acute illness/injury  INTERVENTION:  Continue diet per MD, consider liberalization to regular if PO is poor Adjust to ordering assistance Ensure Enlive po TID, each supplement provides 350 kcal and 20 grams of protein. 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  GOAL:   Patient will meet greater than or equal to 90% of their needs  MONITOR:   PO intake, Supplement acceptance, Skin, Labs  REASON FOR ASSESSMENT:   Malnutrition Screening Tool, Diagnosis    ASSESSMENT:      Pt followed extensively by RD team during previous admission. Pt required cortrak tube and enteral feeds and PEG was being considered prior to discharge. On exam, pt's exam has worsened since last admission. Severe muscle and fat depletions present and also noted a 33% weight loss in the last 6 months (06/2022-12/2022)  Pt resting in bed at the time of assessment. States he is hungry, has not had anything to eat in "several days." Pt also endorses that his appetite has been getting worse over time. NPO this AM, but MD entered renal diet after passing bedside swallow test. Noted that pt had an instance of hyperkalemia yesterday while in ED. If intake is poor, recommend regular diet.   Pt reports drinking ensure several times each day at baseline, will also add here. Pt agreeable to prosource as well.   Large sacral wound, WOC recommended surgical consult after their evaluation.   Temp (24hrs), Avg:100.3 F (37.9 C), Min:98 F (36.7 C), Max:101.9 F (38.8 C)   Intake/Output Summary (Last 24 hours) at 01/23/2023 1317 Last data filed at 01/23/2023 1300 Gross per 24 hour  Intake 7925.25 ml  Output 0 ml  Net 7925.25 ml   Net IO Since Admission: 7,925.25 mL [01/23/23  1317]  Nutritionally Relevant Medications: Scheduled Meds:  pantoprazole (PROTONIX) IV  40 mg Intravenous Daily   Continuous Infusions:  lactated ringers 150 mL/hr at 01/23/23 6629   magnesium sulfate bolus IVPB 2 g (01/23/23 0819)   norepinephrine (LEVOPHED) Adult infusion 2 mcg/min (01/23/23 0600)   piperacillin-tazobactam (ZOSYN)  IV 2.25 g (01/23/23 0624)   Labs Reviewed: Na 134, chloride 97 Creatinine 2.31 Mg 1.5 CBG ranges from 95-116 mg/dL over the last 24 hours  NUTRITION - FOCUSED PHYSICAL EXAM: Flowsheet Row Most Recent Value  Orbital Region Severe depletion  Upper Arm Region Severe depletion  Thoracic and Lumbar Region Severe depletion  Buccal Region Severe depletion  Temple Region Severe depletion  Clavicle Bone Region Severe depletion  Clavicle and Acromion Bone Region Severe depletion  Scapular Bone Region Severe depletion  Dorsal Hand Severe depletion  Patellar Region Moderate depletion  Anterior Thigh Region Moderate depletion  Posterior Calf Region Moderate depletion  Edema (RD Assessment) None  Hair Reviewed  Eyes Reviewed  Mouth Reviewed  Skin Reviewed  Nails Reviewed   Diet Order:   Diet Order             Diet renal with fluid restriction Fluid restriction: 1200 mL Fluid; Room service appropriate? 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 Undermining circumferentially, extends 5 cm   Last BM:  PTA  Height:  Ht Readings from Last 1 Encounters:  01/22/23 '6\' 3"'$  (1.905 m)    Weight:  Wt Readings from Last 1 Encounters:  01/22/23 83 kg    Ideal Body Weight:  89.1 kg  BMI:  Body mass index is 22.87 kg/m.  Estimated Nutritional Needs:  Kcal:  2000-2200 kcal/d Protein:  105-120 g/d Fluid:  UOP + 1  L    Ranell Patrick, RD, LDN Clinical Dietitian RD pager # available in [place]  After hours/weekend pager # available in [facility]

## 2023-01-24 DIAGNOSIS — B9620 Unspecified Escherichia coli [E. coli] as the cause of diseases classified elsewhere: Secondary | ICD-10-CM

## 2023-01-24 DIAGNOSIS — M4628 Osteomyelitis of vertebra, sacral and sacrococcygeal region: Secondary | ICD-10-CM | POA: Diagnosis not present

## 2023-01-24 DIAGNOSIS — R7881 Bacteremia: Secondary | ICD-10-CM | POA: Diagnosis not present

## 2023-01-24 DIAGNOSIS — R404 Transient alteration of awareness: Secondary | ICD-10-CM | POA: Diagnosis not present

## 2023-01-24 DIAGNOSIS — T827XXA Infection and inflammatory reaction due to other cardiac and vascular devices, implants and grafts, initial encounter: Secondary | ICD-10-CM

## 2023-01-24 DIAGNOSIS — A419 Sepsis, unspecified organism: Secondary | ICD-10-CM | POA: Diagnosis not present

## 2023-01-24 LAB — FERRITIN: Ferritin: 2186 ng/mL — ABNORMAL HIGH (ref 24–336)

## 2023-01-24 LAB — CBC
HCT: 22.7 % — ABNORMAL LOW (ref 39.0–52.0)
Hemoglobin: 7.1 g/dL — ABNORMAL LOW (ref 13.0–17.0)
MCH: 32 pg (ref 26.0–34.0)
MCHC: 31.3 g/dL (ref 30.0–36.0)
MCV: 102.3 fL — ABNORMAL HIGH (ref 80.0–100.0)
Platelets: 377 10*3/uL (ref 150–400)
RBC: 2.22 MIL/uL — ABNORMAL LOW (ref 4.22–5.81)
RDW: 18.6 % — ABNORMAL HIGH (ref 11.5–15.5)
WBC: 17.2 10*3/uL — ABNORMAL HIGH (ref 4.0–10.5)
nRBC: 0 % (ref 0.0–0.2)

## 2023-01-24 LAB — HEMOGLOBIN AND HEMATOCRIT, BLOOD
HCT: 26 % — ABNORMAL LOW (ref 39.0–52.0)
Hemoglobin: 8.3 g/dL — ABNORMAL LOW (ref 13.0–17.0)

## 2023-01-24 LAB — GLUCOSE, CAPILLARY
Glucose-Capillary: 107 mg/dL — ABNORMAL HIGH (ref 70–99)
Glucose-Capillary: 96 mg/dL (ref 70–99)
Glucose-Capillary: 95 mg/dL (ref 70–99)
Glucose-Capillary: 109 mg/dL — ABNORMAL HIGH (ref 70–99)
Glucose-Capillary: 115 mg/dL — ABNORMAL HIGH (ref 70–99)
Glucose-Capillary: 92 mg/dL (ref 70–99)

## 2023-01-24 LAB — RENAL FUNCTION PANEL
Albumin: 1.6 g/dL — ABNORMAL LOW (ref 3.5–5.0)
Anion gap: 13 (ref 5–15)
BUN: 34 mg/dL — ABNORMAL HIGH (ref 8–23)
CO2: 23 mmol/L (ref 22–32)
Calcium: 9.1 mg/dL (ref 8.9–10.3)
Chloride: 100 mmol/L (ref 98–111)
Creatinine, Ser: 3.06 mg/dL — ABNORMAL HIGH (ref 0.61–1.24)
GFR, Estimated: 20 mL/min — ABNORMAL LOW (ref >60)
Glucose, Bld: 89 mg/dL (ref 70–99)
Phosphorus: 3.1 mg/dL (ref 2.5–4.6)
Potassium: 3.5 mmol/L (ref 3.5–5.1)
Sodium: 136 mmol/L (ref 135–145)

## 2023-01-24 LAB — RETICULOCYTES
Immature Retic Fract: 27.3 % — ABNORMAL HIGH (ref 2.3–15.9)
RBC.: 2.22 MIL/uL — ABNORMAL LOW (ref 4.22–5.81)
Retic Count, Absolute: 41.5 10*3/uL (ref 19.0–186.0)
Retic Ct Pct: 1.9 % (ref 0.4–3.1)

## 2023-01-24 LAB — PREPARE RBC (CROSSMATCH)

## 2023-01-24 LAB — FOLATE: Folate: 5.7 ng/mL — ABNORMAL LOW (ref >5.9)

## 2023-01-24 LAB — VITAMIN B12: Vitamin B-12: 328 pg/mL (ref 180–914)

## 2023-01-24 LAB — MAGNESIUM: Magnesium: 2.1 mg/dL (ref 1.7–2.4)

## 2023-01-24 LAB — IRON AND TIBC
Iron: 25 ug/dL — ABNORMAL LOW (ref 45–182)
Saturation Ratios: UNDETERMINED % (ref 17.9–39.5)
TIBC: UNDETERMINED ug/dL (ref 250–450)
UIBC: UNDETERMINED ug/dL

## 2023-01-24 LAB — HEPATITIS B SURFACE ANTIGEN: Hepatitis B Surface Ag: NONREACTIVE

## 2023-01-24 MED ORDER — CHLORHEXIDINE GLUCONATE CLOTH 2 % EX PADS
6.0000 | MEDICATED_PAD | Freq: Every day | CUTANEOUS | Status: DC
  Administered 2023-01-25 – 2023-01-27 (×3): 6 via TOPICAL

## 2023-01-24 MED ORDER — SODIUM CHLORIDE 0.9% IV SOLUTION: Freq: Once | INTRAVENOUS | Status: AC

## 2023-01-24 MED ORDER — SODIUM CHLORIDE 0.9 % IV SOLN
1.0000 g | INTRAVENOUS | Status: AC
  Administered 2023-01-24 – 2023-02-06 (×14): 1 g via INTRAVENOUS
  Filled 2023-01-24: qty 1
  Filled 2023-01-24 (×6): qty 10
  Filled 2023-01-24: qty 1
  Filled 2023-01-24 (×7): qty 10

## 2023-01-24 MED ORDER — MIDODRINE HCL 5 MG PO TABS
10.0000 mg | ORAL_TABLET | ORAL | Status: AC
  Filled 2023-01-24 (×2): qty 2

## 2023-01-24 MED ORDER — DARBEPOETIN ALFA 100 MCG/0.5ML IJ SOSY
100.0000 ug | PREFILLED_SYRINGE | INTRAMUSCULAR | Status: DC
  Administered 2023-01-24 – 2023-01-31 (×2): 100 ug via SUBCUTANEOUS
  Filled 2023-01-24 (×2): qty 0.5

## 2023-01-24 MED ORDER — METRONIDAZOLE 500 MG PO TABS
500.0000 mg | ORAL_TABLET | Freq: Two times a day (BID) | ORAL | Status: AC
  Administered 2023-01-24 – 2023-01-31 (×13): 500 mg via ORAL
  Filled 2023-01-24 (×12): qty 1

## 2023-01-24 NOTE — Plan of Care (Signed)

## 2023-01-24 NOTE — Progress Notes (Signed)
Subjective:  Feels better   Antibiotics:  Anti-infectives (From admission, onward)    Start     Dose/Rate Route Frequency Ordered Stop   01/24/23 1800  vancomycin (VANCOCIN) IVPB 1000 mg/200 mL premix  Status:  Discontinued        1,000 mg 200 mL/hr over 60 Minutes Intravenous Every M-W-F (Hemodialysis) 01/22/23 1820 01/23/23 1057   01/23/23 2000  ceFEPIme (MAXIPIME) 2 g in sodium chloride 0.9 % 100 mL IVPB  Status:  Discontinued        2 g 200 mL/hr over 30 Minutes Intravenous Every 24 hours 01/22/23 1820 01/23/23 0102   01/23/23 0600  piperacillin-tazobactam (ZOSYN) IVPB 2.25 g        2.25 g 100 mL/hr over 30 Minutes Intravenous Every 8 hours 01/23/23 0119     01/22/23 1815  ceFEPIme (MAXIPIME) 2 g in sodium chloride 0.9 % 100 mL IVPB        2 g 200 mL/hr over 30 Minutes Intravenous  Once 01/22/23 1804 01/22/23 1944   01/22/23 1815  metroNIDAZOLE (FLAGYL) IVPB 500 mg        500 mg 100 mL/hr over 60 Minutes Intravenous  Once 01/22/23 1804 01/22/23 2005   01/22/23 1815  vancomycin (VANCOCIN) IVPB 1000 mg/200 mL premix        1,000 mg 200 mL/hr over 60 Minutes Intravenous  Once 01/22/23 1804 01/22/23 2107       Medications: Scheduled Meds:  (feeding supplement) PROSource Plus  30 mL Oral BID BM   sodium chloride   Intravenous Once   acetaminophen  1,000 mg Oral Q8H   aspirin EC  81 mg Oral Daily   atorvastatin  40 mg Oral Daily   Chlorhexidine Gluconate Cloth  6 each Topical Daily   Chlorhexidine Gluconate Cloth  6 each Topical Q0600   darbepoetin (ARANESP) injection - DIALYSIS  100 mcg Subcutaneous Q Fri-1800   feeding supplement  237 mL Oral TID BM   gabapentin  100 mg Oral TID   heparin  5,000 Units Subcutaneous Q8H   leptospermum manuka honey  1 Application Topical Daily   midodrine  10 mg Oral Q M,W,F-HD   [START ON 01/25/2023] midodrine  10 mg Oral Q Sat-HD   multivitamin  1 tablet Oral QHS   pantoprazole (PROTONIX) IV  40 mg Intravenous Daily    sodium hypochlorite   Irrigation Q1200   Continuous Infusions:  sodium chloride Stopped (01/24/23 0650)   piperacillin-tazobactam (ZOSYN)  IV 100 mL/hr at 01/24/23 0700   PRN Meds:.sodium chloride, oxyCODONE    Objective: Weight change:   Intake/Output Summary (Last 24 hours) at 01/24/2023 1254 Last data filed at 01/24/2023 0700 Gross per 24 hour  Intake 1106.4 ml  Output 0 ml  Net 1106.4 ml   Blood pressure (!) 94/52, pulse 71, temperature 98.6 F (37 C), temperature source Oral, resp. rate 18, height '6\' 3"'$  (1.905 m), weight 83 kg, SpO2 100 %. Temp:  [97.5 F (36.4 C)-98.6 F (37 C)] 98.6 F (37 C) (02/02 1120) Pulse Rate:  [52-83] 71 (02/02 0900) Resp:  [12-27] 18 (02/02 0900) BP: (81-145)/(42-92) 94/52 (02/02 0900) SpO2:  [99 %-100 %] 100 % (02/02 0900)  Physical Exam: Physical Exam Constitutional:      Appearance: He is well-developed.  HENT:     Head: Normocephalic and atraumatic.  Eyes:     Conjunctiva/sclera: Conjunctivae normal.  Cardiovascular:     Rate and Rhythm: Normal rate and regular  rhythm.  Pulmonary:     Effort: Pulmonary effort is normal. No respiratory distress.     Breath sounds: Stridor present. No wheezing.  Abdominal:     General: There is no distension.     Palpations: Abdomen is soft.  Musculoskeletal:        General: Normal range of motion.     Cervical back: Normal range of motion and neck supple.  Skin:    General: Skin is warm and dry.     Findings: No erythema or rash.  Neurological:     General: No focal deficit present.     Mental Status: He is alert and oriented to person, place, and time.  Psychiatric:        Mood and Affect: Mood normal.        Behavior: Behavior normal.        Thought Content: Thought content normal.        Judgment: Judgment normal.     Wound not examined  CBC:    BMET Recent Labs    01/23/23 0448 01/24/23 0602  NA 134* 136  K 4.1 3.5  CL 97* 100  CO2 25 23  GLUCOSE 112* 89  BUN 18 34*   CREATININE 2.31* 3.06*  CALCIUM 9.3 9.1     Liver Panel  Recent Labs    01/22/23 1738 01/23/23 0448 01/24/23 0602  PROT 7.7  --   --   ALBUMIN 2.2* 1.8* 1.6*  AST 45*  --   --   ALT 16  --   --   ALKPHOS 70  --   --   BILITOT 0.8  --   --        Sedimentation Rate No results for input(s): "ESRSEDRATE" in the last 72 hours. C-Reactive Protein No results for input(s): "CRP" in the last 72 hours.  Micro Results: Recent Results (from the past 720 hour(s))  Culture, blood (Routine X 2) w Reflex to ID Panel     Status: None   Collection Time: 12/25/22  4:24 PM   Specimen: BLOOD  Result Value Ref Range Status   Specimen Description BLOOD SITE NOT SPECIFIED  Final   Special Requests IN PEDIATRIC BOTTLE Blood Culture adequate volume  Final   Culture   Final    NO GROWTH 5 DAYS Performed at [facility], [address] [place], [place] [address]    Report Status 12/30/2022 FINAL  Final  Culture, blood (Routine X 2) w Reflex to ID Panel     Status: None   Collection Time: 12/25/22  4:45 PM   Specimen: BLOOD  Result Value Ref Range Status   Specimen Description BLOOD SITE NOT SPECIFIED  Final   Special Requests   Final    BOTTLES DRAWN AEROBIC ONLY Blood Culture adequate volume   Culture   Final    NO GROWTH 5 DAYS Performed at [facility], [place] [address], [place], [place] [address]    Report Status 12/30/2022 FINAL  Final  Aerobic Culture w Gram Stain (superficial specimen)     Status: None   Collection Time: 12/25/22  5:15 PM   Specimen: Wound  Result Value Ref Range Status   Specimen Description WOUND  Final   Special Requests NONE  Final   Gram Stain   Final    NO WBC SEEN MODERATE GRAM POSITIVE COCCI IN PAIRS MODERATE GRAM NEGATIVE RODS FEW GRAM POSITIVE RODS Performed at [facility], [address], [place], [place] [address]    Culture   Final    FEW ESCHERICHIA COLI ABUNDANT PROTEUS MIRABILIS ABUNDANT ENTEROCOCCUS  FAECALIS VANCOMYCIN RESISTANT ENTEROCOCCUS    Report Status 12/29/2022 FINAL  Final   Organism ID, Bacteria ESCHERICHIA COLI  Final   Organism ID, Bacteria PROTEUS MIRABILIS  Final   Organism ID, Bacteria ENTEROCOCCUS FAECALIS  Final      Susceptibility   Escherichia coli - MIC*    AMPICILLIN >=32 RESISTANT Resistant     CEFAZOLIN <=4 SENSITIVE Sensitive     CEFEPIME <=0.12 SENSITIVE Sensitive     CEFTAZIDIME <=1 SENSITIVE Sensitive     CEFTRIAXONE <=0.25 SENSITIVE Sensitive     CIPROFLOXACIN <=0.25 SENSITIVE Sensitive     GENTAMICIN <=1 SENSITIVE Sensitive     IMIPENEM <=0.25 SENSITIVE Sensitive     TRIMETH/SULFA <=20 SENSITIVE Sensitive     AMPICILLIN/SULBACTAM 16 INTERMEDIATE Intermediate     PIP/TAZO <=4 SENSITIVE Sensitive     * FEW ESCHERICHIA COLI   Enterococcus faecalis - MIC*    AMPICILLIN <=2 SENSITIVE Sensitive     VANCOMYCIN >=32 RESISTANT Resistant     GENTAMICIN SYNERGY RESISTANT Resistant     LINEZOLID 1 SENSITIVE Sensitive     * ABUNDANT ENTEROCOCCUS FAECALIS   Proteus mirabilis - MIC*    AMPICILLIN <=2 SENSITIVE Sensitive     CEFAZOLIN <=4 SENSITIVE Sensitive     CEFEPIME <=0.12 SENSITIVE Sensitive     CEFTAZIDIME <=1 SENSITIVE Sensitive     CEFTRIAXONE <=0.25 SENSITIVE Sensitive     CIPROFLOXACIN <=0.25 SENSITIVE Sensitive     GENTAMICIN <=1 SENSITIVE Sensitive     IMIPENEM 2 SENSITIVE Sensitive     TRIMETH/SULFA <=20 SENSITIVE Sensitive     AMPICILLIN/SULBACTAM <=2 SENSITIVE Sensitive     PIP/TAZO <=4 SENSITIVE Sensitive     * ABUNDANT PROTEUS MIRABILIS  Resp panel by RT-PCR (RSV, Flu A&B, Covid) Anterior Nasal Swab     Status: None   Collection Time: 01/22/23  6:05 PM   Specimen: Anterior Nasal Swab  Result Value Ref Range Status   SARS Coronavirus 2 by RT PCR NEGATIVE NEGATIVE Final    Comment: (NOTE) SARS-CoV-2 target nucleic acids are NOT DETECTED.  The SARS-CoV-2 RNA is generally detectable in upper respiratory specimens during the acute phase  of infection. The lowest concentration of SARS-CoV-2 viral copies this assay can detect is 138 copies/mL. A negative result does not preclude SARS-Cov-2 infection and should not be used as the sole basis for treatment or other patient management decisions. A negative result may occur with  improper specimen collection/handling, submission of specimen other than nasopharyngeal swab, presence of viral mutation(s) within the areas targeted by this assay, and inadequate number of viral copies(<138 copies/mL). A negative result must be combined with clinical observations, patient history, and epidemiological information. The expected result is Negative.  Fact Sheet for Patients:  EntrepreneurPulse.com.au  Fact Sheet for Healthcare Providers:  IncredibleEmployment.be  This test is no t yet approved or cleared by the [place] FDA and  has been authorized for detection and/or diagnosis of SARS-CoV-2 by FDA under an Emergency Use Authorization (EUA). This EUA will remain  in effect (meaning this test can be used) for the duration of the COVID-19 declaration under Section 564(b)(1) of the Act, 21 U.S.C.section 360bbb-3(b)(1), unless the authorization is terminated  or revoked sooner.       Influenza A by PCR NEGATIVE NEGATIVE Final   Influenza B by PCR NEGATIVE NEGATIVE Final    Comment: (  NOTE) The Xpert Xpress SARS-CoV-2/FLU/RSV plus assay is intended as an aid in the diagnosis of influenza from Nasopharyngeal swab specimens and should not be used as a sole basis for treatment. Nasal washings and aspirates are unacceptable for Xpert Xpress SARS-CoV-2/FLU/RSV testing.  Fact Sheet for Patients: EntrepreneurPulse.com.au  Fact Sheet for Healthcare Providers: IncredibleEmployment.be  This test is not yet approved or cleared by the [place] FDA and has been authorized for detection and/or diagnosis of  SARS-CoV-2 by FDA under an Emergency Use Authorization (EUA). This EUA will remain in effect (meaning this test can be used) for the duration of the COVID-19 declaration under Section 564(b)(1) of the Act, 21 U.S.C. section 360bbb-3(b)(1), unless the authorization is terminated or revoked.     Resp Syncytial Virus by PCR NEGATIVE NEGATIVE Final    Comment: (NOTE) Fact Sheet for Patients: EntrepreneurPulse.com.au  Fact Sheet for Healthcare Providers: IncredibleEmployment.be  This test is not yet approved or cleared by the [place] FDA and has been authorized for detection and/or diagnosis of SARS-CoV-2 by FDA under an Emergency Use Authorization (EUA). This EUA will remain in effect (meaning this test can be used) for the duration of the COVID-19 declaration under Section 564(b)(1) of the Act, 21 U.S.C. section 360bbb-3(b)(1), unless the authorization is terminated or revoked.  Performed at [facility], [address], [place], [place] [address]   Culture, blood (Routine x 2)     Status: None (Preliminary result)   Collection Time: 01/22/23  6:25 PM   Specimen: BLOOD RIGHT HAND  Result Value Ref Range Status   Specimen Description   Final    BLOOD RIGHT HAND Performed at [facility], [address], [place], [place] [address]    Special Requests   Final    BOTTLES DRAWN AEROBIC ONLY Blood Culture adequate volume Performed at [address] [place], [place] [address]    Culture  Setup Time   Final    GRAM NEGATIVE RODS AEROBIC BOTTLE Gram Stain Report Called to,Read Back By and Verified With: BARBOUR,A '@[id number]'$  BY MATTHEWS, B 2.1.2024 CRITICAL RESULT CALLED TO, READ BACK BY AND VERIFIED WITH: PHARMD AUSTIN PAYTES [id number] 1458 BY JRS Performed at [facility], [place] [address] [place], [place] [address]    Culture GRAM NEGATIVE RODS  Final   Report Status PENDING  Incomplete  Blood Culture ID Panel (Reflexed)     Status:  Abnormal   Collection Time: 01/22/23  6:25 PM  Result Value Ref Range Status   Enterococcus faecalis NOT DETECTED NOT DETECTED Final   Enterococcus Faecium NOT DETECTED NOT DETECTED Final   Listeria monocytogenes NOT DETECTED NOT DETECTED Final   Staphylococcus species NOT DETECTED NOT DETECTED Final   Staphylococcus aureus (BCID) NOT DETECTED NOT DETECTED Final   Staphylococcus epidermidis NOT DETECTED NOT DETECTED Final   Staphylococcus lugdunensis NOT DETECTED NOT DETECTED Final   Streptococcus species NOT DETECTED NOT DETECTED Final   Streptococcus agalactiae NOT DETECTED NOT DETECTED Final   Streptococcus pneumoniae NOT DETECTED NOT DETECTED Final   Streptococcus pyogenes NOT DETECTED NOT DETECTED Final   A.calcoaceticus-baumannii NOT DETECTED NOT DETECTED Final   Bacteroides fragilis NOT DETECTED NOT DETECTED Final   Enterobacterales DETECTED (A) NOT DETECTED Final    Comment: Enterobacterales represent a large order of gram negative bacteria, not a single organism. CRITICAL RESULT CALLED TO, READ BACK BY AND VERIFIED WITH: PHARMD [place]USTIN PAYTES [id number] 1458 BY JRS    Enterobacter cloacae complex NOT DETECTED NOT DETECTED Final  Escherichia coli DETECTED (A) NOT DETECTED Final    Comment: CRITICAL RESULT CALLED TO, READ BACK BY AND VERIFIED WITH: PAHRMD AUSTIN PAYTES [id number] 1458 BY JRS    Klebsiella aerogenes NOT DETECTED NOT DETECTED Final   Klebsiella oxytoca NOT DETECTED NOT DETECTED Final   Klebsiella pneumoniae NOT DETECTED NOT DETECTED Final   Proteus species NOT DETECTED NOT DETECTED Final   Salmonella species NOT DETECTED NOT DETECTED Final   Serratia marcescens NOT DETECTED NOT DETECTED Final   Haemophilus influenzae NOT DETECTED NOT DETECTED Final   Neisseria meningitidis NOT DETECTED NOT DETECTED Final   Pseudomonas aeruginosa NOT DETECTED NOT DETECTED Final   Stenotrophomonas maltophilia NOT DETECTED NOT DETECTED Final   Candida albicans NOT DETECTED NOT DETECTED  Final   Candida auris NOT DETECTED NOT DETECTED Final   Candida glabrata NOT DETECTED NOT DETECTED Final   Candida krusei NOT DETECTED NOT DETECTED Final   Candida parapsilosis NOT DETECTED NOT DETECTED Final   Candida tropicalis NOT DETECTED NOT DETECTED Final   Cryptococcus neoformans/gattii NOT DETECTED NOT DETECTED Final   CTX-M ESBL NOT DETECTED NOT DETECTED Final   Carbapenem resistance IMP NOT DETECTED NOT DETECTED Final   Carbapenem resistance KPC NOT DETECTED NOT DETECTED Final   Carbapenem resistance NDM NOT DETECTED NOT DETECTED Final   Carbapenem resist OXA 48 LIKE NOT DETECTED NOT DETECTED Final   Carbapenem resistance VIM NOT DETECTED NOT DETECTED Final    Comment: Performed at [facility], [address], [place], [place] [address]  Culture, blood (Routine x 2)     Status: None (Preliminary result)   Collection Time: 01/22/23  6:42 PM   Specimen: BLOOD RIGHT HAND  Result Value Ref Range Status   Specimen Description   Final    BLOOD RIGHT HAND Performed at [facility], [address] [place], [place] [address]    Special Requests   Final    BOTTLES DRAWN AEROBIC AND ANAEROBIC Blood Culture adequate volume Performed at [facility], [address], [place], [place] [address]    Culture  Setup Time   Final    GRAM NEGATIVE RODS IN BOTH AEROBIC AND ANAEROBIC BOTTLES CRITICAL RESULT CALLED TO, READ BACK BY AND VERIFIED WITH: PHARMD CAREN AMEND ON 01/23/23 @ 1803 BY DRT    Culture GRAM NEGATIVE RODS  Final   Report Status PENDING  Incomplete  Blood Culture ID Panel (Reflexed)     Status: Abnormal   Collection Time: 01/22/23  6:42 PM  Result Value Ref Range Status   Enterococcus faecalis NOT DETECTED NOT DETECTED Final   Enterococcus Faecium NOT DETECTED NOT DETECTED Final   Listeria monocytogenes NOT DETECTED NOT DETECTED Final   Staphylococcus species NOT DETECTED NOT DETECTED Final   Staphylococcus aureus (BCID) NOT DETECTED NOT DETECTED Final    Staphylococcus epidermidis NOT DETECTED NOT DETECTED Final   Staphylococcus lugdunensis NOT DETECTED NOT DETECTED Final   Streptococcus species NOT DETECTED NOT DETECTED Final   Streptococcus agalactiae NOT DETECTED NOT DETECTED Final   Streptococcus pneumoniae NOT DETECTED NOT DETECTED Final   Streptococcus pyogenes NOT DETECTED NOT DETECTED Final   A.calcoaceticus-baumannii NOT DETECTED NOT DETECTED Final   Bacteroides fragilis NOT DETECTED NOT DETECTED Final   Enterobacterales DETECTED (A) NOT DETECTED Final    Comment: Enterobacterales represent a large order of gram negative bacteria, not a single organism. Refer to culture for further identification. CRITICAL RESULT CALLED TO, READ BACK BY AND VERIFIED WITH: PHARMD CAREN AMEND ON 01/23/23 @ 1817 BY  DRT    Enterobacter cloacae complex NOT DETECTED NOT DETECTED Final   Escherichia coli NOT DETECTED NOT DETECTED Final   Klebsiella aerogenes NOT DETECTED NOT DETECTED Final   Klebsiella oxytoca NOT DETECTED NOT DETECTED Final   Klebsiella pneumoniae NOT DETECTED NOT DETECTED Final   Proteus species NOT DETECTED NOT DETECTED Final   Salmonella species NOT DETECTED NOT DETECTED Final   Serratia marcescens NOT DETECTED NOT DETECTED Final   Haemophilus influenzae NOT DETECTED NOT DETECTED Final   Neisseria meningitidis NOT DETECTED NOT DETECTED Final   Pseudomonas aeruginosa NOT DETECTED NOT DETECTED Final   Stenotrophomonas maltophilia NOT DETECTED NOT DETECTED Final   Candida albicans NOT DETECTED NOT DETECTED Final   Candida auris NOT DETECTED NOT DETECTED Final   Candida glabrata NOT DETECTED NOT DETECTED Final   Candida krusei NOT DETECTED NOT DETECTED Final   Candida parapsilosis NOT DETECTED NOT DETECTED Final   Candida tropicalis NOT DETECTED NOT DETECTED Final   Cryptococcus neoformans/gattii NOT DETECTED NOT DETECTED Final   CTX-M ESBL NOT DETECTED NOT DETECTED Final   Carbapenem resistance IMP NOT DETECTED NOT DETECTED Final    Carbapenem resistance KPC NOT DETECTED NOT DETECTED Final   Carbapenem resistance NDM NOT DETECTED NOT DETECTED Final   Carbapenem resist OXA 48 LIKE NOT DETECTED NOT DETECTED Final   Carbapenem resistance VIM NOT DETECTED NOT DETECTED Final    Comment: Performed at [facility], [address] [place], [place] [address]  MRSA Next Gen by PCR, Nasal     Status: None   Collection Time: 01/22/23 11:58 PM   Specimen: Nasal Mucosa; Nasal Swab  Result Value Ref Range Status   MRSA by PCR Next Gen NOT DETECTED NOT DETECTED Final    Comment: (NOTE) The GeneXpert MRSA Assay (FDA approved for NASAL specimens only), is one component of a comprehensive MRSA colonization surveillance program. It is not intended to diagnose MRSA infection nor to guide or monitor treatment for MRSA infections. Test performance is not FDA approved in patients less than 16 years old. Performed at [place] [facility], [place] [address] [place],  [address]     Studies/Results: VAS [place] UPPER EXTREMITY VENOUS DUPLEX  Result Date: 01/24/2023 UPPER VENOUS STUDY  Patient Name:  [name]  Date of Exam:   01/23/2023 Medical Rec #: [id number]        Accession #:    [id number] Date of Birth: [birth year]        Patient Gender: M Patient Age:   77 years Exam Location:  [facility] Procedure:      VAS [place] UPPER EXTREMITY VENOUS DUPLEX Referring Phys: Eddie Dibbles HOFFMAN --------------------------------------------------------------------------------  Indications: Edema Risk Factors: None identified. Limitations: Poor ultrasound/tissue interface, bandages and line. Comparison Study: No prior studies. Performing Technologist: Oliver Hum RVT  Examination Guidelines: A complete evaluation includes B-mode imaging, spectral Doppler, color Doppler, and power Doppler as needed of all accessible portions of each vessel. Bilateral testing is considered an integral part of a complete examination. Limited examinations for reoccurring  indications may be performed as noted.  Right Findings: +----------+------------+---------+-----------+----------+-------+ RIGHT     CompressiblePhasicitySpontaneousPropertiesSummary +----------+------------+---------+-----------+----------+-------+ IJV           Full       Yes       Yes                      +----------+------------+---------+-----------+----------+-------+ Subclavian    Full       Yes  Yes                      +----------+------------+---------+-----------+----------+-------+ Axillary      Full       Yes       Yes                      +----------+------------+---------+-----------+----------+-------+ Brachial      Full       Yes       Yes                      +----------+------------+---------+-----------+----------+-------+ Radial        Full                                          +----------+------------+---------+-----------+----------+-------+ Ulnar         Full                                          +----------+------------+---------+-----------+----------+-------+ Cephalic      None                                   Acute  +----------+------------+---------+-----------+----------+-------+ Basilic       Full                                          +----------+------------+---------+-----------+----------+-------+ Thrombus detected in the cephalic vein is noted to only be in the mid forearm.  Left Findings: +----------+------------+---------+-----------+----------+-------+ LEFT      CompressiblePhasicitySpontaneousPropertiesSummary +----------+------------+---------+-----------+----------+-------+ Subclavian    Full       Yes       Yes                      +----------+------------+---------+-----------+----------+-------+  Summary:  Right: No evidence of deep vein thrombosis in the upper extremity. Findings consistent with acute superficial vein thrombosis involving the right cephalic vein.  Left: No evidence  of thrombosis in the subclavian.  *See table(s) above for measurements and observations.  Diagnosing physician: Jamelle Haring Electronically signed by Jamelle Haring on 01/24/2023 at 7:30:04 AM.    Final    IR Removal Tun Cv Cath W/O FL  Result Date: 01/23/2023 INDICATION: 77 year old with gram negative bacteremia in the setting of tunneled dialysis catheter placement as well as sacral wound. EXAM: REMOVAL TUNNELED CENTRAL VENOUS CATHETER MEDICATIONS: 5 mL 1% lidocaine ANESTHESIA/SEDATION: None FLUOROSCOPY: None COMPLICATIONS: None immediate. PROCEDURE: Informed written consent was obtained from the patient after a thorough discussion of the procedural risks, benefits and alternatives. All questions were addressed. Maximal Sterile Barrier Technique was utilized including caps, mask, sterile gowns, sterile gloves, sterile drape, hand hygiene and skin antiseptic. A timeout was performed prior to the initiation of the procedure. The patient's right chest and catheter was prepped and draped in a normal sterile fashion. Heparin was removed from both ports of catheter. 1% lidocaine was used for local anesthesia. Using gentle blunt dissection the cuff of the catheter was exposed and the catheter was removed in it's entirety. Pressure was held till hemostasis was  obtained. A sterile dressing was applied. The patient tolerated the procedure well with no immediate complications. IMPRESSION: Successful catheter removal as described above. Read by: Brynda Greathouse PA-C Electronically Signed   By: Ruthann Cancer M.D.   On: 01/23/2023 20:29   CT CHEST ABDOMEN PELVIS WO CONTRAST  Result Date: 01/23/2023 CLINICAL DATA:  Recent hospitalization for cholecystitis and sacral ulcer, now with sepsis EXAM: CT CHEST, ABDOMEN AND PELVIS WITHOUT CONTRAST TECHNIQUE: Multidetector CT imaging of the chest, abdomen and pelvis was performed following the standard protocol without IV contrast. RADIATION DOSE REDUCTION: This exam was performed  according to the departmental dose-optimization program which includes automated exposure control, adjustment of the mA and/or kV according to patient size and/or use of iterative reconstruction technique. COMPARISON:  CT abdomen/pelvis dated 12/26/2022 FINDINGS: CT CHEST FINDINGS Cardiovascular: The heart is normal in size. No pericardial effusion. No evidence of thoracic aortic aneurysm. Atherosclerotic calcifications of the aortic arch. Severe three-vessel coronary atherosclerosis. Right IJ venous catheter terminates in the upper right atrium. Mediastinum/Nodes: No suspicious mediastinal lymphadenopathy. Visualized thyroid is unremarkable. Lungs/Pleura: Mild bilateral lower lobe scarring/atelectasis. No focal consolidation. Evaluation lung parenchyma is constrained by respiratory motion. Within that constraint, there are no suspicious pulmonary nodules. No pleural effusion or pneumothorax. Musculoskeletal: Healing right posterior 11th rib fracture (series 3/image 66). Moderate degenerative changes of the mid/lower thoracic spine. Thoracic spine stimulator. CT ABDOMEN PELVIS FINDINGS Hepatobiliary: Scattered small hepatic cysts, unchanged. Status post cholecystectomy. No intrahepatic or extrahepatic ductal dilatation. Pancreas: Within normal limits. Spleen: Within normal limits. Adrenals/Urinary Tract: Adrenal glands are within normal limits. Bilateral renal cysts, measuring up to 2.5 cm in the anterior interpolar right kidney (series 3/image 84), measuring simple fluid density, benign (Bosniak I). No follow-up is recommended. No renal calculi or hydronephrosis. Bladder is mildly thick-walled although underdistended. Stomach/Bowel: Stomach is within normal limits. No evidence of bowel obstruction. Normal appendix (series 3/image 83). Mild eccentric rectal wall thickening (series 3/image 117), but new from the prior, favoring infectious/inflammatory colitis. Vascular/Lymphatic: No evidence of abdominal aortic  aneurysm. Atherosclerotic calcifications of the abdominal aorta and branch vessels. No suspicious abdominopelvic lymphadenopathy. Reproductive: Prostate is unremarkable. Other: No abdominopelvic ascites. Musculoskeletal: Status post PLIF at L2-5. Degenerative changes of the lumbar spine. Large left paramidline sacral decubitus ulcer (series 3/image 117), progressive from the prior, with associated coccygeal osteomyelitis (series 3/image 120). IMPRESSION: Large left sacral decubitus ulcer, progressive from the prior, with associated coccygeal osteomyelitis. Mild eccentric rectal wall thickening, new from the prior, favoring infectious/inflammatory colitis. Healing right posterior 11th rib fracture. Otherwise, no acute cardiopulmonary abnormality. Additional ancillary findings as above. Electronically Signed   By: Julian Hy M.D.   On: 01/23/2023 03:58   CT Head Wo Contrast  Result Date: 01/23/2023 CLINICAL DATA:  Recent hospitalization for cholecystitis and sacral ulcer, now with sepsis EXAM: CT HEAD WITHOUT CONTRAST TECHNIQUE: Contiguous axial images were obtained from the base of the skull through the vertex without intravenous contrast. RADIATION DOSE REDUCTION: This exam was performed according to the departmental dose-optimization program which includes automated exposure control, adjustment of the mA and/or kV according to patient size and/or use of iterative reconstruction technique. COMPARISON:  11/04/2022 FINDINGS: Brain: No evidence of acute infarction, hemorrhage, hydrocephalus, extra-axial collection or mass lesion/mass effect. Global cortical atrophy. Subcortical white matter and periventricular small vessel ischemic changes. Vascular: Intracranial atherosclerosis. Skull: Normal. Negative for fracture or focal lesion. Sinuses/Orbits: The visualized paranasal sinuses are essentially clear. The mastoid air cells are unopacified. Other: None. IMPRESSION: No evidence of  acute intracranial  abnormality. Atrophy with small vessel ischemic changes. Electronically Signed   By: Julian Hy M.D.   On: 01/23/2023 03:48   DG Chest Port 1 View  Result Date: 01/22/2023 CLINICAL DATA:  Altered mental status EXAM: PORTABLE CHEST 1 VIEW COMPARISON:  Chest x-ray dated November 04, 2022 FINDINGS: Cardiac and mediastinal contours are unchanged. Right central venous catheter is unchanged in position. Right costophrenic angle is excluded from field of view. Visualized lungs are clear. The visualized skeletal structures are unremarkable. IMPRESSION: No active disease. Electronically Signed   By: Yetta Glassman M.D.   On: 01/22/2023 18:01      Assessment/Plan:  INTERVAL HISTORY: another   Enterobacterales   Is growing from blood besides the E coli   Principal Problem:   E coli bacteremia Active Problems:   End stage renal disease on dialysis ([place])   Septic shock ([place])   Altered mental status    [name] is a 77 y.o. 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  His central line has been removed I have repeated blood cultures that we will hopefully sterilize while he is having his "central line holiday".  We will continue Zosyn until we have identification and sensitivity on all the organisms in the blood.  I spent 90mnutes with the patient including than 50% of the time in face to face and non face to face time with  the patient personally  along with review of medical records in preparation for the visit and during the visit and in coordination of his care.   My partner Dr. WJuleen Chinais available for questions this weekend and he will follow-up the culture data   LOS: 2 days   CAlcide Evener2/01/2023, 12:54 PM

## 2023-01-24 NOTE — Progress Notes (Signed)
Physical Therapy Wound Treatment Patient Details  Name: [name] MRN: [id number] Date of Birth: December 05, 1946  Today's Date: 01/24/2023 Time: 5681-2751 Time Calculation (min): 61 min  Subjective  Subjective Assessment Subjective: pt feeling better and agreeable to therapy Patient and Family Stated Goals: have less pain Prior Treatments: hydro therapy  Pain Score:  Pt with 6/10 facial pain during debridement. Received pain medication prior to treatment   Wound Assessment  Pressure Injury 01/23/23 Sacrum Mid Unstageable - Full thickness tissue loss in which the base of the injury is covered by slough (yellow, tan, gray, green or brown) and/or eschar (tan, brown or black) in the wound bed. (Active)  Wound Image   01/24/23 1500  Dressing Type ABD;Gauze (Comment);Honey;Barrier Film (skin prep) 01/24/23 1500  Dressing Changed 01/24/23 1500  Dressing Change Frequency Daily 01/24/23 1500  State of Healing Non-healing 01/24/23 1500  Site / Wound Assessment Black;Brown;Yellow;Painful;Pink 01/24/23 1500  % Wound base Red or Granulating 65% 12/31/22 0400  % Wound base Yellow/Fibrinous Exudate 5% 01/24/23 1500  % Wound base Black/Eschar 90% 01/24/23 1500  % Wound base Other/Granulation Tissue (Comment) 5% 01/24/23 1500  Peri-wound Assessment Black;Other (Comment);Maceration;Pink;Denuded 01/24/23 1500  Wound Length (cm) 7.8 cm 01/24/23 1500  Wound Width (cm) 4.7 cm 01/24/23 1500  Wound Depth (cm) 3.1 cm 01/24/23 1500  Wound Surface Area (cm^2) 36.66 cm^2 01/24/23 1500  Wound Volume (cm^3) 113.65 cm^3 01/24/23 1500  Tunneling (cm) 5 01/23/23 0000  Undermining (cm) entirity of the wound has undermining, 12 o'clock 2.2 cm, 3 o'clock 1.2 cm, 6 o'clock 1.4 cm, 9 o'clock 3.5 01/24/23 1500  Margins Unattached edges (unapproximated) 01/24/23 1500  Drainage Amount Moderate 01/24/23 1500  Drainage Description Odor - foul;Serosanguineous 01/24/23 1500  Treatment Debridement (Selective);Irrigation  01/24/23 1500   Selective Debridement (non-excisional) Selective Debridement (non-excisional) - Location: Sacrum Selective Debridement (non-excisional) - Tools Used: Forceps, Scalpel, Scissors Selective Debridement (non-excisional) - Tissue Removed: gray black fibrin necrotic, tissue, from base of the wound and hardened black and yellow eschar around margin of the wound    Wound Assessment and Plan  Wound Therapy - Assess/Plan/Recommendations Wound Therapy - Clinical Statement: Pt's wound has continued to evolve since last hydrotherapy treatment during last hospitalization about a month ago, Base of wound covered in blace gray necrotic tissue, wound in now undermining the entire circumfrence of the wound and likely has some tunneling behind the necrotic walls of the wound. Drainage is foul smelling. Pt will benefit from continued hydrotherapy to clean the base of the wound, however pt sacral bone is exposed at the base of the wound and has already been found to have osteomyelitis which will  hinder wound healing. PT will continue to follow for hydrotherapy. Wound Therapy - Functional Problem List: decreased mobility Factors Delaying/Impairing Wound Healing: Diabetes Mellitus Hydrotherapy Plan: Debridement, Dressing change, Patient/family education Wound Therapy - Frequency:  (2x/wk) Wound Therapy - Current Recommendations: WOC nurse Wound Therapy - Follow Up Recommendations: dressing changes by RN  Wound Therapy Goals- Improve the function of patient's integumentary system by progressing the wound(s) through the phases of wound healing (inflammation - proliferation - remodeling) by: Wound Therapy Goals - Improve the function of patient's integumentary system by progressing the wound(s) through the phases of wound healing by: Decrease Necrotic Tissue to: 80 Decrease Necrotic Tissue - Progress: Goal set today Increase Granulation Tissue to: 10 Increase Granulation Tissue - Progress: Goal set  today Additional Wound Therapy Goal - Progress: Goal set today Goals/treatment plan/discharge plan were made with  and agreed upon by patient/family: Yes Time For Goal Achievement: 7 days Wound Therapy - Potential for Goals: Poor  Goals will be updated until maximal potential achieved or discharge criteria met.  Discharge criteria: when goals achieved, discharge from hospital, MD decision/surgical intervention, no progress towards goals, refusal/missing three consecutive treatments without notification or medical reason.  GP     Charges PT Wound Care Charges $Wound Debridement up to 20 cm: < or equal to 20 cm $ Wound Debridement each add'l 20 sqcm: 1 $PT Hydrotherapy Dressing: 1 dressing $PT Hydrotherapy Visit: 1 Visit      [name] B. Migdalia Dk PT, DPT Acute Rehabilitation Services Please use secure chat or  Call Office [phone]  Omer 01/24/2023, 4:23 PM

## 2023-01-24 NOTE — Progress Notes (Signed)
[name] NEPHROLOGY PROGRESS NOTE  Assessment/ Plan: Pt is a 77 y.o. yo male with ESRD on HD, CHF, DM, A-fib, CAD, mostly bedbound with physical deconditioning known large sacral decubitus ulcer, recently discharged from hospital on 1/30 now presented back with septic shock.  Dialysis Orders: [facility] MWF 4hr, 400/800, EDW 83kg, 3K/2.25Ca, R TDC, heparin 4000 unit bolus q HD - Full 4hr HD 1/31, was below new EDW - left 78.6kg - Mircera 225 mcg IV q 2 weeks (due 2/2, last given Aranesp 212mg on 1/26) - no VDRA  # Septic shock due to E. coli bacteremia/large decubiti ulcer with coccygeal osteomyelitis: The patient is off of pressors with soft BP.  On Zosyn.  Seen by general surgery recommended no acute surgical need at this time.  HD catheter was removed on 2/1 for line holiday.  # ESRD MWF: He had a full dialysis on 1/31.  Now line holiday.  No urgent need for HD today.  He will likely need HD tomorrow for which he will need either temporary line versus TDC.  # Hypotension/volume: Required pressors.  Currently off of it.  Receiving midodrine.  # Secondary hyperparathyroidism: Calcium and phosphorus level acceptable.  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  # Goals of care: Patient with multiple admission, stage IV sacral decubiti, physical deconditioning.  I agree with palliative care consult.   Subjective: Seen and examined in ICU.  Patient is off of pressors.  He was alert awake and denies chest pain or shortness of breath.  No new event. Objective Vital signs in last 24 hours: Vitals:   01/24/23 0700 01/24/23 0727 01/24/23 0800 01/24/23 0900  BP: (!) 111/55  102/61 (!) 94/52  Pulse: 69  74 71  Resp: '17  16 18  '$ Temp:  98.4 F (36.9 C)    TempSrc:  Oral    SpO2: 100%  100% 100%  Weight:      Height:       Weight change:   Intake/Output Summary (Last 24 hours) at 01/24/2023 1028 Last data filed at 01/24/2023  0700 Gross per 24 hour  Intake 1390.52 ml  Output 0 ml  Net 1390.52 ml       Labs: RENAL PANEL Recent Labs    12/17/22 0404 12/17/22 1850 12/18/22 0431 12/19/22 0437 12/20/22 0424 12/21/22 0145 12/22/22 0122 12/23/22 0140 12/28/22 0219 12/29/22 0132 12/30/22 0156 12/31/22 0617 01/09/23 0217 01/10/23 0314 01/11/23 0227 01/12/23 0152 01/13/23 1624 01/15/23 1551 01/17/23 0742 01/20/23 0942 01/21/23 1055 01/22/23 1738 01/22/23 1746 01/22/23 1838 01/23/23 0448 01/24/23 0602  NA  --    < > 139 135 136 137 135   < >  --  137 140   < > 135 136 134* 134* 136 133* 134* 139 134* 138 135  --  134* 136  K  --    < > 4.9 4.2 5.3* 3.6 3.4*   < >  --  4.0 4.4   < > 4.6 4.5 4.0 4.5 4.7 3.9 3.2* 4.6 4.2 4.7 6.6*  --  4.1 3.5  CL  --    < > 106 98 102 98 100   < >  --  96* 100   < > 98 100 98 99 101 92* 95* 99 95* 98 102  --  97* 100  CO2  --    < > '28 29 25 28 25   '$ < >  --  28  27   < > '28 28 27 24 25 28 28 25 27 22  '$ --   --  25 23  GLUCOSE  --    < > 87 81 110* 82 105*   < >  --  200* 171*   < > 77 84 89 126* 145* 125* 128* 139* 112* 129* 128* 179* 112* 89  BUN  --    < > '19 12 20 11 16   '$ < >  --  67* 88*   < > 20 33* 20 36* 54* 20 49* 62* 39* 18 23  --  18 34*  CREATININE  --    < > 3.71* 2.55* 3.55* 2.54* 3.30*   < >  --  3.89* 4.98*   < > 2.38* 3.48* 2.28* 3.40* 4.60* 2.25* 4.32* 5.03* 3.03* 1.96* 2.00*  --  2.31* 3.06*  CALCIUM  --    < > 9.2 8.9 9.2 8.8* 8.9   < >  --  9.4 9.5   < > 9.6 9.9 9.3 9.6 10.1 9.5 10.0 10.4* 9.7 9.2  --   --  9.3 9.1  MG 2.0  --  1.9 1.6* 1.7 1.6* 1.6*  --  1.6* 2.1 2.0  --   --   --   --   --   --   --   --   --   --   --   --   --  1.5*  --   PHOS  --    < > 3.6 3.1 4.4 3.1 2.9   < > 1.5* 1.6* 3.1  3.1   < > 4.0 5.3* 3.7 4.4 4.9* 2.9 4.6  --  3.7  --   --   --  2.8 3.1  ALBUMIN  --    < > 1.9* 2.0* 2.3* 2.1* 2.2*   < >  --  2.1* 2.1*   < > 2.0* 1.9* 2.0* 1.9* 2.0* 2.1* 2.0*  --  2.1* 2.2*  --   --  1.8* 1.6*   < > = values in this interval not  displayed.     Liver Function Tests: Recent Labs  Lab 01/22/23 1738 01/23/23 0448 01/24/23 0602  AST 45*  --   --   ALT 16  --   --   ALKPHOS 70  --   --   BILITOT 0.8  --   --   PROT 7.7  --   --   ALBUMIN 2.2* 1.8* 1.6*   No results for input(s): "LIPASE", "AMYLASE" in the last 168 hours. No results for input(s): "AMMONIA" in the last 168 hours. CBC: Recent Labs    07/17/22 1404 07/28/22 2328 09/15/22 0743 09/16/22 0442 11/29/22 1422 12/12/22 2202 12/22/22 0122 12/23/22 0140 01/17/23 0742 01/22/23 1738 01/22/23 1746 01/23/23 0448 01/24/23 0552 01/24/23 0602  HGB 8.4*   < >  --    < >  --    < > 8.1*   < > 8.0* 9.4* 10.5* 8.5*  --  7.1*  MCV 99.2   < >  --    < >  --    < > 102.7*   < > 100.4* 104.0*  --  103.0*  --  102.3*  VITAMINB12 118*  --  437  --   --   --   --   --   --   --   --   --   --   --   FOLATE 9.1  --  6.7  --   --   --   --   --   --   --   --   --   --   --   FERRITIN 352*  --  986*  --   --   --  1,139*  --   --   --   --   --   --   --   TIBC 205*  --  144*  --  177*  --  105*  --   --   --   --   --   --   --   IRON 17*  --  13*  --  37*  --  20*  --   --   --   --   --   --   --   RETICCTPCT 1.7  --  2.0  --   --   --   --   --   --   --   --   --  1.9  --    < > = values in this interval not displayed.    Cardiac Enzymes: No results for input(s): "CKTOTAL", "CKMB", "CKMBINDEX", "TROPONINI" in the last 168 hours. CBG: Recent Labs  Lab 01/23/23 1519 01/23/23 1914 01/23/23 2312 01/24/23 0318 01/24/23 0725  GLUCAP 102* 123* 104* 107* 96    Iron Studies: No results for input(s): "IRON", "TIBC", "TRANSFERRIN", "FERRITIN" in the last 72 hours. Studies/Results: VAS [place] UPPER EXTREMITY VENOUS DUPLEX  Result Date: 01/24/2023 UPPER VENOUS STUDY  Patient Name:  [name]  Date of Exam:   01/23/2023 Medical Rec #: [id number]        Accession #:    [id number] Date of Birth: [birth year]        Patient Gender: M Patient Age:   11 years Exam  Location:  [facility] Procedure:      VAS [place] UPPER EXTREMITY VENOUS DUPLEX Referring Phys: Eddie Dibbles HOFFMAN --------------------------------------------------------------------------------  Indications: Edema Risk Factors: None identified. Limitations: Poor ultrasound/tissue interface, bandages and line. Comparison Study: No prior studies. Performing Technologist: Oliver Hum RVT  Examination Guidelines: A complete evaluation includes B-mode imaging, spectral Doppler, color Doppler, and power Doppler as needed of all accessible portions of each vessel. Bilateral testing is considered an integral part of a complete examination. Limited examinations for reoccurring indications may be performed as noted.  Right Findings: +----------+------------+---------+-----------+----------+-------+ RIGHT     CompressiblePhasicitySpontaneousPropertiesSummary +----------+------------+---------+-----------+----------+-------+ IJV           Full       Yes       Yes                      +----------+------------+---------+-----------+----------+-------+ Subclavian    Full       Yes       Yes                      +----------+------------+---------+-----------+----------+-------+ Axillary      Full       Yes       Yes                      +----------+------------+---------+-----------+----------+-------+ Brachial      Full       Yes       Yes                      +----------+------------+---------+-----------+----------+-------+ Radial  Full                                          +----------+------------+---------+-----------+----------+-------+ Ulnar         Full                                          +----------+------------+---------+-----------+----------+-------+ Cephalic      None                                   Acute  +----------+------------+---------+-----------+----------+-------+ Basilic       Full                                           +----------+------------+---------+-----------+----------+-------+ Thrombus detected in the cephalic vein is noted to only be in the mid forearm.  Left Findings: +----------+------------+---------+-----------+----------+-------+ LEFT      CompressiblePhasicitySpontaneousPropertiesSummary +----------+------------+---------+-----------+----------+-------+ Subclavian    Full       Yes       Yes                      +----------+------------+---------+-----------+----------+-------+  Summary:  Right: No evidence of deep vein thrombosis in the upper extremity. Findings consistent with acute superficial vein thrombosis involving the right cephalic vein.  Left: No evidence of thrombosis in the subclavian.  *See table(s) above for measurements and observations.  Diagnosing physician: Jamelle Haring Electronically signed by Jamelle Haring on 01/24/2023 at 7:30:04 AM.    Final    IR Removal Tun Cv Cath W/O FL  Result Date: 01/23/2023 INDICATION: 77 year old with gram negative bacteremia in the setting of tunneled dialysis catheter placement as well as sacral wound. EXAM: REMOVAL TUNNELED CENTRAL VENOUS CATHETER MEDICATIONS: 5 mL 1% lidocaine ANESTHESIA/SEDATION: None FLUOROSCOPY: None COMPLICATIONS: None immediate. PROCEDURE: Informed written consent was obtained from the patient after a thorough discussion of the procedural risks, benefits and alternatives. All questions were addressed. Maximal Sterile Barrier Technique was utilized including caps, mask, sterile gowns, sterile gloves, sterile drape, hand hygiene and skin antiseptic. A timeout was performed prior to the initiation of the procedure. The patient's right chest and catheter was prepped and draped in a normal sterile fashion. Heparin was removed from both ports of catheter. 1% lidocaine was used for local anesthesia. Using gentle blunt dissection the cuff of the catheter was exposed and the catheter was removed in it's entirety. Pressure was held till  hemostasis was obtained. A sterile dressing was applied. The patient tolerated the procedure well with no immediate complications. IMPRESSION: Successful catheter removal as described above. Read by: Brynda Greathouse PA-C Electronically Signed   By: Ruthann Cancer M.D.   On: 01/23/2023 20:29   CT CHEST ABDOMEN PELVIS WO CONTRAST  Result Date: 01/23/2023 CLINICAL DATA:  Recent hospitalization for cholecystitis and sacral ulcer, now with sepsis EXAM: CT CHEST, ABDOMEN AND PELVIS WITHOUT CONTRAST TECHNIQUE: Multidetector CT imaging of the chest, abdomen and pelvis was performed following the standard protocol without IV contrast. RADIATION DOSE REDUCTION: This exam was performed according to the departmental dose-optimization program which includes automated exposure control, adjustment of the mA  and/or kV according to patient size and/or use of iterative reconstruction technique. COMPARISON:  CT abdomen/pelvis dated 12/26/2022 FINDINGS: CT CHEST FINDINGS Cardiovascular: The heart is normal in size. No pericardial effusion. No evidence of thoracic aortic aneurysm. Atherosclerotic calcifications of the aortic arch. Severe three-vessel coronary atherosclerosis. Right IJ venous catheter terminates in the upper right atrium. Mediastinum/Nodes: No suspicious mediastinal lymphadenopathy. Visualized thyroid is unremarkable. Lungs/Pleura: Mild bilateral lower lobe scarring/atelectasis. No focal consolidation. Evaluation lung parenchyma is constrained by respiratory motion. Within that constraint, there are no suspicious pulmonary nodules. No pleural effusion or pneumothorax. Musculoskeletal: Healing right posterior 11th rib fracture (series 3/image 66). Moderate degenerative changes of the mid/lower thoracic spine. Thoracic spine stimulator. CT ABDOMEN PELVIS FINDINGS Hepatobiliary: Scattered small hepatic cysts, unchanged. Status post cholecystectomy. No intrahepatic or extrahepatic ductal dilatation. Pancreas: Within normal  limits. Spleen: Within normal limits. Adrenals/Urinary Tract: Adrenal glands are within normal limits. Bilateral renal cysts, measuring up to 2.5 cm in the anterior interpolar right kidney (series 3/image 84), measuring simple fluid density, benign (Bosniak I). No follow-up is recommended. No renal calculi or hydronephrosis. Bladder is mildly thick-walled although underdistended. Stomach/Bowel: Stomach is within normal limits. No evidence of bowel obstruction. Normal appendix (series 3/image 83). Mild eccentric rectal wall thickening (series 3/image 117), but new from the prior, favoring infectious/inflammatory colitis. Vascular/Lymphatic: No evidence of abdominal aortic aneurysm. Atherosclerotic calcifications of the abdominal aorta and branch vessels. No suspicious abdominopelvic lymphadenopathy. Reproductive: Prostate is unremarkable. Other: No abdominopelvic ascites. Musculoskeletal: Status post PLIF at L2-5. Degenerative changes of the lumbar spine. Large left paramidline sacral decubitus ulcer (series 3/image 117), progressive from the prior, with associated coccygeal osteomyelitis (series 3/image 120). IMPRESSION: Large left sacral decubitus ulcer, progressive from the prior, with associated coccygeal osteomyelitis. Mild eccentric rectal wall thickening, new from the prior, favoring infectious/inflammatory colitis. Healing right posterior 11th rib fracture. Otherwise, no acute cardiopulmonary abnormality. Additional ancillary findings as above. Electronically Signed   By: Julian Hy M.D.   On: 01/23/2023 03:58   CT Head Wo Contrast  Result Date: 01/23/2023 CLINICAL DATA:  Recent hospitalization for cholecystitis and sacral ulcer, now with sepsis EXAM: CT HEAD WITHOUT CONTRAST TECHNIQUE: Contiguous axial images were obtained from the base of the skull through the vertex without intravenous contrast. RADIATION DOSE REDUCTION: This exam was performed according to the departmental dose-optimization  program which includes automated exposure control, adjustment of the mA and/or kV according to patient size and/or use of iterative reconstruction technique. COMPARISON:  11/04/2022 FINDINGS: Brain: No evidence of acute infarction, hemorrhage, hydrocephalus, extra-axial collection or mass lesion/mass effect. Global cortical atrophy. Subcortical white matter and periventricular small vessel ischemic changes. Vascular: Intracranial atherosclerosis. Skull: Normal. Negative for fracture or focal lesion. Sinuses/Orbits: The visualized paranasal sinuses are essentially clear. The mastoid air cells are unopacified. Other: None. IMPRESSION: No evidence of acute intracranial abnormality. Atrophy with small vessel ischemic changes. Electronically Signed   By: Julian Hy M.D.   On: 01/23/2023 03:48   DG Chest Port 1 View  Result Date: 01/22/2023 CLINICAL DATA:  Altered mental status EXAM: PORTABLE CHEST 1 VIEW COMPARISON:  Chest x-ray dated November 04, 2022 FINDINGS: Cardiac and mediastinal contours are unchanged. Right central venous catheter is unchanged in position. Right costophrenic angle is excluded from field of view. Visualized lungs are clear. The visualized skeletal structures are unremarkable. IMPRESSION: No active disease. Electronically Signed   By: Yetta Glassman M.D.   On: 01/22/2023 18:01    Medications: Infusions:  sodium chloride Stopped (01/24/23 0650)  piperacillin-tazobactam (ZOSYN)  IV 100 mL/hr at 01/24/23 0700    Scheduled Medications:  (feeding supplement) PROSource Plus  30 mL Oral BID BM   sodium chloride   Intravenous Once   acetaminophen  1,000 mg Oral Q8H   aspirin EC  81 mg Oral Daily   atorvastatin  40 mg Oral Daily   Chlorhexidine Gluconate Cloth  6 each Topical Daily   feeding supplement  237 mL Oral TID BM   gabapentin  100 mg Oral TID   heparin  5,000 Units Subcutaneous Q8H   leptospermum manuka honey  1 Application Topical Daily   midodrine  10 mg Oral Q  M,W,F-HD   [START ON 01/25/2023] midodrine  10 mg Oral Q Sat-HD   multivitamin  1 tablet Oral QHS   pantoprazole (PROTONIX) IV  40 mg Intravenous Daily   sodium hypochlorite   Irrigation Q1200    have reviewed scheduled and prn medications.  Physical Exam: General: Elderly looking frail male lying on bed in ICU. Heart:RRR, s1s2 nl Lungs:clear b/l, no crackle Abdomen:soft, Non-tender, non-distended Extremities:No edema Dialysis Access: HD catheter was removed, no access at the moment.  Ashon Rosenberg Prasad Amahd Morino 01/24/2023,10:28 AM  LOS: 2 days

## 2023-01-24 NOTE — Progress Notes (Addendum)
NAME:  [name], MRN:  [id number], DOB:  [birth year], LOS: 2 ADMISSION DATE:  01/22/2023, CONSULTATION DATE:  2/1 REFERRING MD:  Dr. Rogene Houston EDP Forestine Na, CHIEF COMPLAINT:  Hypotension   History of Present Illness:  77 year old male with past medical history as below, which is significant for ESRD on dialysis, heart failure with reduced EF, diabetes mellitus, paroxysmal atrial fib, and coronary artery disease.  He has been bedbound for approximately the last year and has a known large sacral decub with tunneling.  He has a recent admission to [facility] health on 12/21.  He had previously been diagnosed with cholecystitis and underwent cholecystostomy.  Tube had become dislodged and fortunately the bile duct had recanalized so tube was not replaced.  He was admitted on 12/21 for inpatient workup prior to cholecystectomy.  He underwent left heart cath and was found to have multivessel CAD without critical ischemia.  He underwent cholecystectomy on 12/28.  Hospitalization complicated by decubitus ulcer POA, which was found to be infected with E. coli and E faecalis status post treatment with antibiotics.  No abscess or osteomyelitis on CT and therefore surgery was not indicated.  Palliative care was consulted during the admission and the patient/family continues to wish for full scope with possible aggressive measures.  Course also complicated by malnutrition.  The patient did briefly require core track for nutritional needs, but was ultimately able to be nutritional goals by p.o. alone.  Discharged 1/30.  He presented to [facility] emergency department 1/31 from dialysis with complaint of low blood pressure.  It is unknown whether or not he received dialysis prior to being sent.  There was also concern of full body shaking.  Upon arrival to the emergency department he was minimally responsive but did respond to sternal rub and was ultimately able to arouse and answer some questions.  He was found to be  tachycardic, febrile, tachypneic, and hypotensive.  Initially responded to IV fluid resuscitation.  Initial lactic acid 5.5 and increased to 6.4.  Laboratory evaluation significant for WBC 19.5.  He became hypotensive again and was ultimately started on norepinephrine infusion.  His presentation was felt to be consistent with septic shock and he was initiated on empiric antibiotics.  PCCM was asked to admit to ICU at [facility].  Pertinent  Medical History   has a past medical history of Anginal pain ([place]), Anxiety, Benign prostatic hypertrophy, CAD (coronary artery disease), Cardiomyopathy ([place]), Degenerative joint disease, Depression, Diabetes mellitus, type II ([place]) (12/10/2012), Erectile dysfunction, ESRD (end stage renal disease) ([place]), Flu (2013), GERD (gastroesophageal reflux disease), Gout, Hemorrhoids, High cholesterol, Hypertension, Myocardial infarction ([place]), Neuropathy, NSVT (nonsustained ventricular tachycardia) ([place]), Obesity, PAF (paroxysmal atrial fibrillation) ([place]), Spinal cord stimulator, Tubular adenoma (2014), and Vertigo.   Significant Hospital Events: Including procedures, antibiotic start and stop dates in addition to other pertinent events   1/21 > 1/30 admit for cholecystectomy 1/33 admit for shock  Interim History / Subjective:  Patient is awake and alert.  He is mildly confused but is able to answer question appropriately.  He endorses my abdominal pain but denies diarrhea the last few days.  At baseline he uses a wheelchair for ambulation.  Objective   Blood pressure (!) 111/55, pulse 69, temperature 98.4 F (36.9 C), temperature source Oral, resp. rate 17, height '6\' 3"'$  (1.905 m), weight 83 kg, SpO2 100 %.        Intake/Output Summary (Last 24 hours) at 01/24/2023 0802 Last data filed at 01/24/2023 0700  Gross per 24 hour  Intake 1770.55 ml  Output 0 ml  Net 1770.55 ml    Filed Weights   01/22/23 1809  Weight: 83 kg    Examination: General: Cachectic elderly  appearing gentleman in NAD.   HENT: [place]/AT, PERRL,  No dentures. Lungs: Clear bilateral breath sounds Cardiovascular: Irregular rhythm.  Normal rate.  No murmur.  Trace edema of bilateral lower extremity. Abdomen: soft, non-tender in 4 quadrants.  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  Mild contractures seen in bilateral wrist. Neuro: Awake, alert, oriented to person and place but not to time Resolved Hospital Problem list     Assessment & Plan:  Septic shock secondary to E. coli bacteremia Large decubitus ulcer with coccygeal osteomyelitis Rectal colitis The most likely source of infection is slightly his wound and osteomyelitis.  No reported GI symptoms at this time.  Surgery has evaluated the wound and did not recommend any surgical procedures. -Infectious disease following.  Continue Zosyn -He has been off of pressors since last night.  If continues to do well this afternoon, can be transfer out of the ICU. -Continue home midodrine 10 mg 3 times daily with HD  Acute metabolic/infectious encephalopathy In the setting of septic shock from bacteremia.  His mental status improved significantly today.  Patient is able to tolerate p.o. intake. -Minimize centrally acting medications  ESRD on HD MWF His right IJ tunneled cath was removed yesterday.  I think we can push his HD back to Saturday.  Labs looks good and does not appear volume overloaded on exam. -Nephrology is consulted.  Acute on chronic macrocytic anemia Macrocytic anemia is likely a component of nutritional status.  He may be bleeding slowly from his open wound. -Transfuse 1 unit of blood  Chronic HFrEF - LVEF recently improved from 35% to 55% PAF CAD - extensive workup last admission including LHC. Multivessel CAD with no significant stenosis.  -Volume removal with HD -Resume aspirin and statin -Hold isosorbide due to shock -Limited GDMT with his ESRD  Chronic [place]  type I second-degree heart block -Telemetry  Sacral decub POA No surgical debridement or candidate for diverting colostomy per surgery - WOC consult  DM2 - CBG monitoring   Moderate protein calorie malnutrition -Encourage p.o. intake  RUE edema -No DVT.  Only acute superficial thrombosis.  Failure to thrive Functional quadriplegia - Palliative has been involved recently and signed off. Patient and family are very clear for full code and scope.   Best Practice (right click and "Reselect all SmartList Selections" daily)   Diet/type: renal DVT prophylaxis: prophylactic heparin  GI prophylaxis: PPI Lines: NA Foley:  N/A Code Status:  full code Last date of multidisciplinary goals of care discussion '[ ]'$   Labs   CBC: Recent Labs  Lab 01/22/23 1738 01/22/23 1746 01/23/23 0448 01/24/23 0602  WBC 19.5*  --  21.3* 17.2*  NEUTROABS 16.2*  --   --   --   HGB 9.4* 10.5* 8.5* 7.1*  HCT 30.9* 31.0* 27.2* 22.7*  MCV 104.0*  --  103.0* 102.3*  PLT 488*  --  392 377     Basic Metabolic Panel: Recent Labs  Lab 01/20/23 0942 01/21/23 1055 01/22/23 1738 01/22/23 1746 01/22/23 1838 01/23/23 0448 01/24/23 0602  NA 139 134* 138 135  --  134* 136  K 4.6 4.2 4.7 6.6*  --  4.1 3.5  CL 99 95* 98 102  --  97* 100  CO2  $'25 27 22  'L$ --   --  25 23  GLUCOSE 139* 112* 129* 128* 179* 112* 89  BUN 62* 39* 18 23  --  18 34*  CREATININE 5.03* 3.03* 1.96* 2.00*  --  2.31* 3.06*  CALCIUM 10.4* 9.7 9.2  --   --  9.3 9.1  MG  --   --   --   --   --  1.5*  --   PHOS  --  3.7  --   --   --  2.8 3.1    GFR: Estimated Creatinine Clearance: 23.7 mL/min (A) (by C-G formula based on SCr of 3.06 mg/dL (H)). Recent Labs  Lab 01/22/23 1738 01/22/23 2005 01/23/23 0448 01/24/23 0602  WBC 19.5*  --  21.3* 17.2*  LATICACIDVEN 5.5* 6.4* 2.2*  --      Liver Function Tests: Recent Labs  Lab 01/21/23 1055 01/22/23 1738 01/23/23 0448 01/24/23 0602  AST  --  45*  --   --   ALT  --  16  --    --   ALKPHOS  --  70  --   --   BILITOT  --  0.8  --   --   PROT  --  7.7  --   --   ALBUMIN 2.1* 2.2* 1.8* 1.6*    No results for input(s): "LIPASE", "AMYLASE" in the last 168 hours. No results for input(s): "AMMONIA" in the last 168 hours.  ABG    Component Value Date/Time   PHART 7.47 (H) 07/30/2022 0430   PCO2ART 40 07/30/2022 0430   PO2ART 75 (L) 07/30/2022 0430   HCO3 29.1 (H) 07/30/2022 0430   TCO2 25 01/22/2023 1746   O2SAT 97.8 07/30/2022 0430     Coagulation Profile: Recent Labs  Lab 01/22/23 1838  INR 1.3*     Cardiac Enzymes: No results for input(s): "CKTOTAL", "CKMB", "CKMBINDEX", "TROPONINI" in the last 168 hours.  HbA1C: Hgb A1c MFr Bld  Date/Time Value Ref Range Status  12/10/2022 11:22 AM 4.4 (L) 4.8 - 5.6 % Final    Comment:    (NOTE)         Prediabetes: 5.7 - 6.4         Diabetes: >6.4         Glycemic control for adults with diabetes: <7.0   06/04/2022 02:11 PM 6.1 (H) 4.8 - 5.6 % Final    Comment:    (NOTE) Pre diabetes:          5.7%-6.4%  Diabetes:              >6.4%  Glycemic control for   <7.0% adults with diabetes     CBG: Recent Labs  Lab 01/23/23 1519 01/23/23 1914 01/23/23 2312 01/24/23 0318 01/24/23 0725  GLUCAP 102* 123* 104* 107* 96     Review of Systems:   Patient is encephalopathic and/or intubated. Therefore history has been obtained from chart review.    Past Medical History:  He,  has a past medical history of Anginal pain ([place]), Anxiety, Benign prostatic hypertrophy, CAD (coronary artery disease), Cardiomyopathy ([place]), Degenerative joint disease, Depression, Diabetes mellitus, type II ([place]) (12/10/2012), Erectile dysfunction, ESRD (end stage renal disease) ([place]), Flu (2013), GERD (gastroesophageal reflux disease), Gout, Hemorrhoids, High cholesterol, Hypertension, Myocardial infarction ([place]), Neuropathy, NSVT (nonsustained ventricular tachycardia) ([place]), Obesity, PAF (paroxysmal atrial fibrillation) ([place]),  Spinal cord stimulator, Tubular adenoma (2014), and Vertigo.   Surgical History:   Past Surgical History:  Procedure Laterality Date  BACK SURGERY  2007   x2   BIOPSY  02/05/2021   Procedure: BIOPSY;  Surgeon: Daneil Dolin, MD;  [facility];  Service: Endoscopy;;   CARDIAC CATHETERIZATION     with stent placement   CHOLECYSTECTOMY N/A 12/19/2022   Procedure: LAPAROSCOPIC CHOLECYSTECTOMY;  Surgeon: Stark Klein, MD;  Location: [place];  Service: General;  Laterality: N/A;   COLONOSCOPY WITH ESOPHAGOGASTRODUODENOSCOPY (EGD) N/A 11/23/2013   Dr. Oneida Alar- TCS= left sided colitis and mild proctitis likely due to ischemic colitis, moderate internal hemorrhoids, small nodule in the rectum, bx=tubular adenoma, EGD=-mild non-erosive gastritis   COLONOSCOPY WITH PROPOFOL N/A 07/24/2020   hemorrhoids,  rectosigmoid lipoma, small adenoma removed.   CORONARY ANGIOPLASTY     ESOPHAGOGASTRODUODENOSCOPY (EGD) WITH PROPOFOL N/A 02/05/2021   Normal esophagus, erythematous mucosa in entire stomach, no obvious ulcer, s/p biopsy. Normal duodenum. Negative H.pylori.    HERNIA REPAIR     INSERTION OF DIALYSIS CATHETER N/A 08/06/2022   Procedure: INSERTION OF DIALYSIS CATHETER;  Surgeon: Rosetta Posner, MD;  Location: [facility];  Service: Vascular;  Laterality: N/A;   IR EXCHANGE BILIARY DRAIN  10/17/2022   IR PERC CHOLECYSTOSTOMY  08/20/2022   IR REMOVAL TUN CV CATH W/O FL  01/23/2023   JOINT REPLACEMENT     LEFT HEART CATH AND CORONARY ANGIOGRAPHY N/A 12/17/2022   Procedure: LEFT HEART CATH AND CORONARY ANGIOGRAPHY;  Surgeon: Leonie Man, MD;  Location: [place] CV LAB;  Service: Cardiovascular;  Laterality: N/A;   LEFT HEART CATHETERIZATION WITH CORONARY ANGIOGRAM N/A 11/16/2012   Procedure: LEFT HEART CATHETERIZATION WITH CORONARY ANGIOGRAM;  Surgeon: Sherren Mocha, MD;  Location: [facility] CATH LAB;  Service: Cardiovascular;  Laterality: N/A;   PERCUTANEOUS CORONARY STENT INTERVENTION (PCI-S)   11/16/2012   Procedure: PERCUTANEOUS CORONARY STENT INTERVENTION (PCI-S);  Surgeon: Sherren Mocha, MD;  Location: [facility] CATH LAB;  Service: Cardiovascular;;   POLYPECTOMY  07/24/2020   Procedure: POLYPECTOMY;  Surgeon: Daneil Dolin, MD;  [facility];  Service: Endoscopy;;   SPINAL CORD STIMULATOR INSERTION N/A 06/10/2022   Procedure: Spinal cord stimulator placement;  Surgeon: Newman Pies, MD;  Location: [place];  Service: Neurosurgery;  Laterality: N/A;  RM 19/ to follow   THUMB AMPUTATION Left    50 years ago   TOTAL KNEE ARTHROPLASTY Right 11/15/2013   Procedure: RIGHT TOTAL KNEE ARTHROPLASTY;  Surgeon: Mauri Pole, MD;  Location: [facility];  Service: Orthopedics;  Laterality: Right;   TOTAL KNEE ARTHROPLASTY Left 03/07/2015   Procedure: LEFT TOTAL KNEE ARTHROPLASTY;  Surgeon: Paralee Cancel, MD;  Location: [facility] OR[facility];  Service: Orthopedics;  Laterality: Left;     Social History:   reports that he quit smoking about 30 years ago. His smoking use included cigarettes. He started smoking about 58 years ago. He has a 30.00 pack-year smoking history. He has never used smokeless tobacco. He reports that he does not drink alcohol and does not use drugs.   Family History:  His family history includes CAD in his brother; Cancer in his father and another family member; Diabetes in an other family member; Heart disease in an other family member; Hypertension in his mother and another family member. There is no history of Colon cancer.   Allergies Allergies  Allergen Reactions   Metformin Nausea And Vomiting   Niacin    Niacin And Related Other (See Comments)    Reaction: flushing and burning side effects even with extended release     Home Medications  Prior to Admission medications   Medication Sig Start Date End Date Taking? Authorizing Provider  acetaminophen (TYLENOL) 500 MG tablet Take 2 tablets (1,000 mg total) by mouth every 6 (six) hours. 08/10/22  Yes Shahmehdi, Seyed A, MD   atorvastatin (LIPITOR) 40 MG tablet Take 1 tablet (40 mg total) by mouth daily. 01/10/23  Yes Bonnielee Haff, MD  Darbepoetin Alfa (ARANESP) 200 MCG/0.4ML SOSY injection Inject 0.4 mLs (200 mcg total) into the skin every Friday at 6 PM. 01/10/23  Yes Bonnielee Haff, MD  feeding supplement (ENSURE ENLIVE / ENSURE PLUS) LIQD Take 237 mLs by mouth 3 (three) times daily with meals. 01/09/23  Yes Bonnielee Haff, MD  gabapentin (NEURONTIN) 100 MG capsule Take 1 capsule (100 mg total) by mouth 3 (three) times daily. 01/09/23  Yes Bonnielee Haff, MD  isosorbide dinitrate (ISORDIL) 5 MG tablet Take 1 tablet (5 mg total) by mouth 2 (two) times daily. 01/09/23  Yes Bonnielee Haff, MD  leptospermum manuka honey (MEDIHONEY) PSTE paste Apply 1 Application topically daily. 01/10/23  Yes Bonnielee Haff, MD  melatonin 5 MG TABS Take 5 mg by mouth at bedtime.   Yes [provider]  midodrine (PROAMATINE) 10 MG tablet Take 1 tablet (10 mg total) by mouth every Monday, Wednesday, and Friday with hemodialysis. 01/22/23  Yes Shawna Clamp, MD  multivitamin (RENA-VIT) TABS tablet Take 1 tablet by mouth at bedtime. 01/09/23  Yes Bonnielee Haff, MD  Nystatin (GERHARDT'S BUTT CREAM) CREA Apply 1 Application topically 2 (two) times daily. 01/09/23  Yes Bonnielee Haff, MD  oxyCODONE (OXY IR/ROXICODONE) 5 MG immediate release tablet Take 0.5-1 tablets (2.5-5 mg total) by mouth every 4 (four) hours as needed for moderate pain. 01/16/23  Yes Bonnielee Haff, MD  polyethylene glycol (MIRALAX / GLYCOLAX) 17 g packet Take 17 g by mouth 2 (two) times daily. 01/16/23  Yes Bonnielee Haff, MD  prochlorperazine (COMPAZINE) 5 MG tablet Take 1 tablet (5 mg total) by mouth every 6 (six) hours as needed for nausea or vomiting. 01/16/23  Yes Bonnielee Haff, MD  SANTYL 250 UNIT/GM ointment Apply topically daily. 09/21/22  Yes [provider]  senna-docusate (SENOKOT-S) 8.6-50 MG tablet Take 2 tablets by mouth 2 (two) times  daily. 01/09/23  Yes Bonnielee Haff, MD  allopurinol (ZYLOPRIM) 100 MG tablet Take 100 mg by mouth daily. Patient not taking: Reported on 01/22/2023 08/17/20   [provider]  amoxicillin-clavulanate (AUGMENTIN) 500-125 MG tablet Take 1 tablet by mouth daily at 6 PM for 7 days. 01/16/23 01/23/23  Bonnielee Haff, MD  aspirin EC 81 MG tablet Take 1 tablet (81 mg total) by mouth daily. Patient not taking: Reported on 01/22/2023 09/14/19   Herminio Commons, MD  calcitRIOL (ROCALTROL) 0.25 MCG capsule Take 0.25 mcg by mouth every Sunday.  Patient not taking: Reported on 01/22/2023 12/13/16   [provider]  carboxymethylcellulose (REFRESH PLUS) 0.5 % SOLN Place 1 drop into both eyes daily as needed (dry eyes).    [provider]  meclizine (ANTIVERT) 12.5 MG tablet Take 1 tablet (12.5 mg total) by mouth 3 (three) times daily as needed for dizziness. 09/16/22   Manuella Ghazi, Pratik D, DO  RESTASIS 0.05 % ophthalmic emulsion Place 1 drop into both eyes 2 (two) times daily. Patient not taking: Reported on 01/22/2023 06/19/22   [provider]  simethicone (MYLICON) 80 MG chewable tablet Chew 80 mg by mouth every 6 (six) hours as needed for flatulence.    [provider]  TRUE METRIX  BLOOD GLUCOSE TEST test strip USE TO TEST BLOOD SUGAR UP TO TWICE DAILY OR AS DIRECTED 08/31/21   Renato Shin, MD     Critical care time:     Gaylan Gerold, DO Internal Medicine Residency My pager: [phone]

## 2023-01-24 NOTE — TOC Initial Note (Signed)
Transition of Care [facility]) - Initial/Assessment Note    Patient Details  Name: [name] MRN: [id number] Date of Birth: 1946/08/02  Transition of Care [facility]) CM/SW Contact:    Amador Cunas, [place] Phone Number: 01/24/2023, 10:16 AM  Clinical Narrative:  Pt re-admit after BP issues in HD [facility] MWF). Pt is a STR resident at [facility]. Spoke to Destiny in admissions who confirmed pt able to return. Will need new auth for return to SNF, no PT/OT orders yet. Palliative Care consult for [place] pending. SW will follow and assist as indicated.   Wandra Feinstein, MSW, LCSW [phone] (coverage)                  Expected Discharge Plan: [place] Barriers to Discharge: Continued Medical Work up, Ship broker   Patient Goals and CMS Choice            Expected Discharge Plan and Services                                              Prior Living Arrangements/Services   Lives with:: Facility Resident                   Activities of Daily Living      Permission Sought/Granted                  Emotional Assessment       Orientation: : Oriented to Self, Oriented to Place, Oriented to  Time, Oriented to Situation Alcohol / Substance Use: Not Applicable Psych Involvement: No (comment)  Admission diagnosis:  Hyperkalemia [E87.5] End stage renal disease on dialysis ([place]) [N18.6, Z99.2] Sepsis ([place]) [A41.9] Altered mental status, unspecified altered mental status type [R41.82] Sepsis, due to unspecified organism, unspecified whether acute organ dysfunction present ([place]) [A41.9] Septic shock ([place]) [A41.9, R65.21] Patient Active Problem List   Diagnosis Date Noted   Altered mental status 01/23/2023   E coli bacteremia 01/23/2023   Paroxysmal atrial fibrillation ([place]) 01/03/2023   Malnutrition of moderate degree 12/27/2022   Protein-calorie malnutrition, severe 12/25/2022   Preoperative cardiovascular  examination 12/17/2022   Abnormal EKG 12/13/2022   Nausea and vomiting 12/12/2022   Pressure injury of skin of sacral region 12/12/2022   Prolonged QT interval 12/12/2022   Generalized weakness 12/12/2022   Pressure injury of sacral region, stage 1 09/24/2022   Hypotension    Septic shock ([place]) 09/13/2022   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   End stage renal disease on dialysis ([place]) 08/07/2022   HFrEF (heart failure with reduced ejection fraction) ([place])    (HFpEF) heart failure with preserved ejection fraction ([place]) 07/29/2022   Hypokalemia 07/29/2022   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   Acute respiratory failure with hypoxia ([place])    B12 deficiency 07/18/2022   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/2022   Diabetic neuropathy ([place]) 05/10/2022   Elevated troponin 05/10/2022   Type 2 diabetes mellitus with hyperglycemia ([place]) 05/10/2022   Other intervertebral disc degeneration, lumbar region 02/06/2022   Chronic gout, unspecified, 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ophi) 02/06/2022   Body mass index (BMI) 35.0-35.9, adult 02/06/2022   Obesity 02/06/2022   Primary generalized (osteo)arthritis 02/06/2022   Chest pain 02/06/2022   Lumbago with sciatica, right side 08/28/2021   Weakness of both lower extremities 08/28/2021   Body mass index (BMI) 33.0-33.9, adult 08/28/2021  Constipation 02/15/2021   Abdominal pain 01/02/2021   Diabetes mellitus ([place]) 11/09/2020   Hypertensive disorder 11/09/2020   Hypercholesterolemia 11/09/2020   H/O adenomatous polyp of colon 05/05/2020   Iron deficiency anemia 05/05/2020   Chronic kidney disease, stage IV (severe) ([place]) 12/14/2019   Acute gouty arthritis 10/25/2019   Leukocytosis 04/02/2019   Cellulitis of right hand 04/01/2019   Osteoarthritis of ankle and foot 02/08/2019   Heart failure with improved ejection fraction (HFimpEF) ([place]) 07/05/2015   Acute congestive heart failure ([place]) 07/04/2015   Acute on chronic diastolic congestive heart failure ([place])  07/04/2015   Mixed hyperlipidemia 07/04/2015   Anxiety state 07/04/2015   GERD (gastroesophageal reflux disease) 07/04/2015   Heart block 07/04/2015   Gastroesophageal reflux disease 67/67/2094   Diastolic CHF, chronic (HCC) 03/11/2015   Chronic diastolic heart failure ([place]) 03/11/2015   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   Knee stiffness 01/13/2014   Gout 12/11/2013   Expected blood loss anemia 11/17/2013   S/P right TKA 11/15/2013   Anemia, normocytic normochromic 01/12/2013   Acute kidney injury superimposed on chronic kidney disease ([place]) 12/10/2012   Diabetes mellitus type 2 in obese ([place]) 12/10/2012   Coronary artery disease involving native coronary artery of native heart without angina pectoris 11/24/2012   Overweight(278.02) 11/17/2012   Chronic kidney disease, stage 3, cardiorenal syndrome 08/23/2011   Chronic kidney disease, stage 3 unspecified ([place]) 08/23/2011   Essential hypertension    PCP:  Redmond School, MD Pharmacy:   [facility], [place] [address] Phone: [phone] Fax: [phone]  [facility] [address] [place] [address] Phone: [phone] Fax: [phone]     Social Determinants of Health (SDOH) Social History: SDOH Screenings   Food Insecurity: No Food Insecurity (01/05/2023)  Housing: Low Risk  (12/17/2022)  Transportation Needs: No Transportation Needs (12/17/2022)  Utilities: Not At Risk (12/17/2022)  Tobacco Use: Medium Risk (01/23/2023)   SDOH Interventions:     Readmission Risk Interventions    01/20/2023   10:26 AM 09/16/2022   12:31 PM 08/02/2022    2:23 PM  Readmission Risk Prevention Plan  Transportation Screening Complete Complete Complete  Medication Review (RN Care Manager) Complete Complete Complete  PCP or Specialist appointment within 3-5 days of discharge Complete    HRI or Home Care Consult Complete Complete Complete  SW Recovery  Care/Counseling Consult Complete Complete Complete  Palliative Care Screening Complete Not Applicable Not Applicable  Skilled Nursing Facility Complete Not Applicable Complete

## 2023-01-24 NOTE — Progress Notes (Signed)
Pharmacy Antibiotic Note  [name] is a 77 y.o. male admitted on 01/22/2023 with sepsis. Patient has ESRD on HD MWF. He was discharged after hospitalization for cholecystitis and sacral ulcer to SNF on 1/30 and readmitted 1/31 with hypotension, tachypnea, tachycardia and WBC up to 19.5 from 11.3. R subclavian HD catheter removed 2/1. Polymicrobial BSI with E coli and Morganell morganii. Morganella with potential for AmpC production so will switch from zosyn to cefepime. Pharmacy has been consulted for cefepime dosing.  Plan: STOP Zosyn  START cefepime 1g IV Q24H  F/U HD schedule/tolerability, C/S  Height: '6\' 3"'$  (190.5 cm) Weight: 83 kg (182 lb 15.7 oz) IBW/kg (Calculated) : 84.5  Temp (24hrs), Avg:98.3 F (36.8 C), Min:97.5 F (36.4 C), Max:98.9 F (37.2 C)  Recent Labs  Lab 01/21/23 1055 01/22/23 1738 01/22/23 1746 01/22/23 2005 01/23/23 0448 01/24/23 0602  WBC  --  19.5*  --   --  21.3* 17.2*  CREATININE 3.03* 1.96* 2.00*  --  2.31* 3.06*  LATICACIDVEN  --  5.5*  --  6.4* 2.2*  --      Estimated Creatinine Clearance: 23.7 mL/min (A) (by C-G formula based on SCr of 3.06 mg/dL (H)).    Allergies  Allergen Reactions   Metformin Nausea And Vomiting   Niacin    Niacin And Related Other (See Comments)    Reaction: flushing and burning side effects even with extended release    Antimicrobials this admission: Augmentin 1/24>> 1/30 Cefepime 1/31 x 1; 2/2>>c  flagyl 1/31 x1; 2/2>>c  Vanco 1/31 Zosyn 2/1>>2/2  Microbiology results: 1/31 BCx: E coli (one set), morganella morganii (one set)  2/1 Bcx: sent   Thank you for allowing pharmacy to be a part of this patient's care.  Adria Dill, PharmD PGY-2 Infectious Diseases Resident  01/24/2023 4:30 PM

## 2023-01-24 NOTE — NC FL2 (Signed)
[place] LEVEL OF CARE FORM     IDENTIFICATION  Patient Name: [name] Birthdate: 12/09/1946 Sex: male Admission Date (Current Location): 01/22/2023  [facility] and [place] Number:  Herbalist and Address:  [facility], [place] [address] [place], [place] [address]      Provider Number: [id number]  Attending Physician Name and Address:  Garner Nash, DO  Relative Name and Phone Number:       Current Level of Care: Hospital Recommended Level of Care: [place] Prior Approval Number:    Date Approved/Denied:   PASRR Number: [id number]  Discharge Plan: SNF    Current Diagnoses: Patient Active Problem List   Diagnosis Date Noted   Altered mental status 01/23/2023   E coli bacteremia 01/23/2023   Paroxysmal atrial fibrillation ([place]) 01/03/2023   Malnutrition of moderate degree 12/27/2022   Protein-calorie malnutrition, severe 12/25/2022   Preoperative cardiovascular examination 12/17/2022   Abnormal EKG 12/13/2022   Nausea and vomiting 12/12/2022   Pressure injury of skin of sacral region 12/12/2022   Prolonged QT interval 12/12/2022   Generalized weakness 12/12/2022   Pressure injury of sacral region, stage 1 09/24/2022   Hypotension    Septic shock ([place]) 09/13/2022   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   End stage renal disease on dialysis ([place]) 08/07/2022   HFrEF (heart failure with reduced ejection fraction) (HCC)    (HFpEF) heart failure with preserved ejection fraction ([place]) 07/29/2022   Hypokalemia 07/29/2022   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   Acute respiratory failure with hypoxia ([place])    B12 deficiency 07/18/2022   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/2022   Diabetic neuropathy ([place]) 05/10/2022   Elevated troponin 05/10/2022   Type 2 diabetes mellitus with hyperglycemia ([place]) 05/10/2022   Other intervertebral disc degeneration, lumbar region 02/06/2022   Chronic gout, unspecified, with  tophus (tophi) 02/06/2022   Body mass index (BMI) 35.0-35.9, adult 02/06/2022   Obesity 02/06/2022   Primary generalized (osteo)arthritis 02/06/2022   Chest pain 02/06/2022   Lumbago with sciatica, right side 08/28/2021   Weakness of both lower extremities 08/28/2021   Body mass index (BMI) 33.0-33.9, adult 08/28/2021   Constipation 02/15/2021   Abdominal pain 01/02/2021   Diabetes mellitus ([place]) 11/09/2020   Hypertensive disorder 11/09/2020   Hypercholesterolemia 11/09/2020   H/O adenomatous polyp of colon 05/05/2020   Iron deficiency anemia 05/05/2020   Chronic kidney disease, stage IV (severe) ([place]) 12/14/2019   Acute gouty arthritis 10/25/2019   Leukocytosis 04/02/2019   Cellulitis of right hand 04/01/2019   Osteoarthritis of ankle and foot 02/08/2019   Heart failure with improved ejection fraction (HFimpEF) ([place]) 07/05/2015   Acute congestive heart failure ([place]) 07/04/2015   Acute on chronic diastolic congestive heart failure ([place]) 07/04/2015   Mixed hyperlipidemia 07/04/2015   Anxiety state 07/04/2015   GERD (gastroesophageal reflux disease) 07/04/2015   Heart block 07/04/2015   Gastroesophageal reflux disease 10/08/5101   Diastolic CHF, chronic (HCC) 03/11/2015   Chronic diastolic heart failure ([place]) 03/11/2015   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   Knee stiffness 01/13/2014   Gout 12/11/2013   Expected blood loss anemia 11/17/2013   S/P right TKA 11/15/2013   Anemia, normocytic normochromic 01/12/2013   Acute kidney injury superimposed on chronic kidney disease ([place]) 12/10/2012   Diabetes mellitus type 2 in obese ([place]) 12/10/2012   Coronary artery disease involving native coronary artery of native heart without angina pectoris 11/24/2012   Overweight(278.02) 11/17/2012  Chronic kidney disease, stage 3, cardiorenal syndrome 08/23/2011   Chronic kidney disease, stage 3 unspecified ([place]) 08/23/2011   Essential hypertension     Orientation RESPIRATION BLADDER Height & Weight      Self, Time, Situation, Place  Normal Continent Weight: 182 lb 15.7 oz (83 kg) Height:  '6\' 3"'$  (190.5 cm)  BEHAVIORAL SYMPTOMS/MOOD NEUROLOGICAL BOWEL NUTRITION STATUS      Continent    AMBULATORY STATUS COMMUNICATION OF NEEDS Skin   Extensive Assist   Other (Comment) (Please see WOC notes for details)                       Personal Care Assistance Level of Assistance  Bathing, Feeding, Dressing Bathing Assistance: Limited assistance Feeding assistance: Limited assistance Dressing Assistance: Limited assistance     Functional Limitations Info  Sight, Hearing, Speech Sight Info: Adequate Hearing Info: Adequate Speech Info: Adequate    SPECIAL CARE FACTORS FREQUENCY  OT (By licensed OT), PT (By licensed PT)                    Contractures Contractures Info: Not present    Additional Factors Info  Code Status Code Status Info: FULL CODE             Current Medications (01/24/2023):  This is the current hospital active medication list Current Facility-Administered Medications  Medication Dose Route Frequency Provider Last Rate Last Admin   (feeding supplement) PROSource Plus liquid 30 mL  30 mL Oral BID BM McQuaid, Douglas B, MD   30 mL at 01/23/23 1514   0.9 %  sodium chloride infusion (Manually program via Guardrails IV Fluids)   Intravenous Once Gaylan Gerold, DO       0.9 %  sodium chloride infusion   Intravenous PRN Collene Gobble, MD   Stopped at 01/24/23 0650   acetaminophen (TYLENOL) tablet 1,000 mg  1,000 mg Oral Q8H Gaylan Gerold, DO   1,000 mg at 01/24/23 7867   aspirin EC tablet 81 mg  81 mg Oral Daily Gaylan Gerold, DO   81 mg at 01/23/23 1234   atorvastatin (LIPITOR) tablet 40 mg  40 mg Oral Daily Gaylan Gerold, DO   40 mg at 01/23/23 1235   Chlorhexidine Gluconate Cloth 2 % PADS 6 each  6 each Topical Daily Collene Gobble, MD   6 each at 01/23/23 2109   feeding supplement (ENSURE ENLIVE / ENSURE PLUS) liquid 237 mL  237 mL Oral TID BM Simonne Maffucci B, MD   237 mL at 01/23/23 1947   gabapentin (NEURONTIN) capsule 100 mg  100 mg Oral TID Gaylan Gerold, DO   100 mg at 01/23/23 2101   heparin injection 5,000 Units  5,000 Units Subcutaneous Q8H Corey Harold, NP   5,000 Units at 01/24/23 6720   leptospermum manuka honey (MEDIHONEY) paste 1 Application  1 Application Topical Daily Saverio Danker, PA-C   1 Application at 94/70/96 1949   midodrine (PROAMATINE) tablet 10 mg  10 mg Oral Q M,W,F-HD Gaylan Gerold, DO       [START ON 01/25/2023] midodrine (PROAMATINE) tablet 10 mg  10 mg Oral Q Sat-HD Gaylan Gerold, DO       multivitamin (RENA-VIT) tablet 1 tablet  1 tablet Oral QHS Simonne Maffucci B, MD   1 tablet at 01/23/23 2100   oxyCODONE (Oxy IR/ROXICODONE) immediate release tablet 5 mg  5 mg Oral Q4H PRN Frederik Pear, MD   5  mg at 01/24/23 0649   pantoprazole (PROTONIX) injection 40 mg  40 mg Intravenous Daily Corey Harold, NP   40 mg at 01/23/23 0312   piperacillin-tazobactam (ZOSYN) IVPB 2.25 g  2.25 g Intravenous Q8H RudisillJodean Lima, RPH 100 mL/hr at 01/24/23 0700 Infusion Verify at 01/24/23 0700   sodium hypochlorite (DAKIN'S 1/4 STRENGTH) topical solution   Irrigation Q1200 Collene Gobble, MD   Given at 01/23/23 1047     Discharge Medications: Please see discharge summary for a list of discharge medications.  Relevant Imaging Results:  Relevant Lab Results:   Additional Information SSN [id number], HD at [facility] MWF  Wandra Fe[address] [place], [place]

## 2023-01-25 DIAGNOSIS — Z515 Encounter for palliative care: Secondary | ICD-10-CM | POA: Diagnosis not present

## 2023-01-25 DIAGNOSIS — Z7189 Other specified counseling: Secondary | ICD-10-CM | POA: Diagnosis not present

## 2023-01-25 DIAGNOSIS — R7881 Bacteremia: Secondary | ICD-10-CM | POA: Diagnosis not present

## 2023-01-25 DIAGNOSIS — B9620 Unspecified Escherichia coli [E. coli] as the cause of diseases classified elsewhere: Secondary | ICD-10-CM | POA: Diagnosis not present

## 2023-01-25 LAB — CBC
HCT: 30.1 % — ABNORMAL LOW (ref 39.0–52.0)
Hemoglobin: 9.9 g/dL — ABNORMAL LOW (ref 13.0–17.0)
MCH: 32.1 pg (ref 26.0–34.0)
MCHC: 32.9 g/dL (ref 30.0–36.0)
MCV: 97.7 fL (ref 80.0–100.0)
Platelets: 381 10*3/uL (ref 150–400)
RBC: 3.08 MIL/uL — ABNORMAL LOW (ref 4.22–5.81)
RDW: 19.5 % — ABNORMAL HIGH (ref 11.5–15.5)
WBC: 17.2 10*3/uL — ABNORMAL HIGH (ref 4.0–10.5)
nRBC: 0 % (ref 0.0–0.2)

## 2023-01-25 LAB — RENAL FUNCTION PANEL
Albumin: 1.7 g/dL — ABNORMAL LOW (ref 3.5–5.0)
Anion gap: 14 (ref 5–15)
BUN: 51 mg/dL — ABNORMAL HIGH (ref 8–23)
CO2: 21 mmol/L — ABNORMAL LOW (ref 22–32)
Calcium: 9.3 mg/dL (ref 8.9–10.3)
Chloride: 98 mmol/L (ref 98–111)
Creatinine, Ser: 3.73 mg/dL — ABNORMAL HIGH (ref 0.61–1.24)
GFR, Estimated: 16 mL/min — ABNORMAL LOW (ref >60)
Glucose, Bld: 119 mg/dL — ABNORMAL HIGH (ref 70–99)
Phosphorus: 3.6 mg/dL (ref 2.5–4.6)
Potassium: 3.8 mmol/L (ref 3.5–5.1)
Sodium: 133 mmol/L — ABNORMAL LOW (ref 135–145)

## 2023-01-25 LAB — CULTURE, BLOOD (ROUTINE X 2)
Special Requests: ADEQUATE
Special Requests: ADEQUATE

## 2023-01-25 LAB — GLUCOSE, CAPILLARY
Glucose-Capillary: 78 mg/dL (ref 70–99)
Glucose-Capillary: 91 mg/dL (ref 70–99)
Glucose-Capillary: 108 mg/dL — ABNORMAL HIGH (ref 70–99)

## 2023-01-25 LAB — HEPATITIS B SURFACE ANTIBODY, QUANTITATIVE: Hep B S AB Quant (Post): <3.1 m[IU]/mL — ABNORMAL LOW (ref >9.9)

## 2023-01-25 MED ORDER — ISOSORBIDE DINITRATE 10 MG PO TABS
5.0000 mg | ORAL_TABLET | Freq: Two times a day (BID) | ORAL | Status: DC
  Administered 2023-01-25 – 2023-02-07 (×25): 5 mg via ORAL
  Filled 2023-01-25 (×29): qty 1

## 2023-01-25 MED ORDER — FOLIC ACID 1 MG PO TABS
1.0000 mg | ORAL_TABLET | Freq: Every day | ORAL | Status: DC
  Administered 2023-01-25 – 2023-02-08 (×14): 1 mg via ORAL
  Filled 2023-01-25 (×14): qty 1

## 2023-01-25 NOTE — Progress Notes (Addendum)
[facility] PROGRESS NOTE  [name] (DOB: Sep 24, 1946) NLG:[id number] PCP: Redmond School, MD  Brief Narrative: [name] is a 77 y.o. male with a history of recent failure to thrive, ESRD (HD MWF), multivessel CAD, T2DM, HFrEF, PAFprolonged admission, bedbound with large tunneling sacral decubitus ulcer who was discharged to SNF 1/30 only to return 1/31 with septic shock due to E. coli bacteremia, Morganella also growing on cultures, coccygeal osteomyelitis causing metabolic encephalopathy. Antibiotics, guided by ID, have been given with improvement, liberation from pressors. HD catheter removed pending blood culture clearance.  Subjective: Rousable, states he didn't eat much breakfast and wants the head of the bed down. Also wants some tylenol for pain in his butt.  Objective: BP (!) 157/87 (BP Location: Left Arm)   Pulse 81   Temp 98.2 F (36.8 C) (Oral)   Resp 15   Ht '6\' 3"'$  (1.905 m)   Wt 85.5 kg   SpO2 100%   BMI 23.56 kg/m   Gen: Thin, frail male in no distress Pulm: Clear, nonlabored  CV: Regular borderline bradycardia without pitting edema or JVD. GI: Soft, NT, ND, +BS  Neuro: Alert, slowly interactive, suspect some higher level cognitive impairment but able to communicate, oriented. No new focal deficits. Ext: Warm, no deformities Skin: No rashes, lesions or ulcers on visualized skin   Assessment & Plan: Principal Problem:   E coli bacteremia Active Problems:   End stage renal disease on dialysis (HCC)   Septic shock (HCC)   Altered mental status   Chronic osteomyelitis of coccyx (HCC)   Infection of bloodstream concurrent with and due to presence of hemodialysis catheter ([place])  Septic shock due to GNR (E. coli, Morganella) bacteremia due to coccygeal osteomyelitis: Liberated from pressors.  - Continue midodrine - Surgery consulted, no procedure recommended.  - Continue cefepime, flagyl pending clearance of repeat blood cultures (2/2), NGTD, per  ID  Sacral decubitus, POA:  - Continue offloading the wound, local wound care, optimizing nutrition, and discussing goals of care.   ESRD:  - Resume regular HD MWF on 2/5 if able to replace HD catheter once blood cultures confirmed to have cleared x48 hours. Appreciate nephrology assistance.  Acute metabolic encephalopathy: Due to septic shock, has improved considerably.  - Delirium precautions  Acute blood loss anemia on chronic macrocytic anemia and AOESRD:  - s/p 1u PRBCs 2/2 with improvement, no gross brisk bleeding noted, though likely having blood loss from wound. Looks on T&S like pt has Ab's. Will minimize transfusions as able. - ESA per nephrology - Supplement folic acid in addition to broad multivitamin given low level (5.7)  Moderate protein calorie malnutrition:  - Encourage po intake, supp protein as able.   CAD: No anginal complaints. - Holding isosorbide with soft BP, ok to restart now - Continue ASA, statin  T2DM:  - SSI.   Chronic HFrEF: Last echo 12/12/2022 with LVEF 50-55%, no RWMA. Appears roughly euvolemic currently.  - Manage volume with HD.   Goals of care counseling/discussion:  - Appreciate palliative care assistance in this difficult situation.   Right cephalic (superficial) vein thrombosis: No DVT.  - Supportive care.   Patrecia Pour, MD [facility] [email or web address] 01/25/2023, 3:28 PM

## 2023-01-25 NOTE — Plan of Care (Signed)
  Problem: Education: Goal: Knowledge of General Education information will improve Description: Including pain rating scale, medication(s)/side effects and non-pharmacologic comfort measures Outcome: Progressing   Problem: Activity: Goal: Risk for activity intolerance will decrease Outcome: Progressing   Problem: Nutrition: Goal: Adequate nutrition will be maintained Outcome: Progressing   

## 2023-01-25 NOTE — Consult Note (Signed)
Palliative Medicine Inpatient Consult Note  Consulting Provider: Dr. Alease Frame  Reason for consult:   [place] Palliative Medicine Consult  Reason for Consult? Recurrent hospitalization, Non healing sacral decub ulcer   01/25/2023  HPI:  Per intake H&P --> Pt is a 77 y.o. yo male with ESRD on HD, CHF, DM, A-fib, CAD, mostly bedbound with physical deconditioning known large sacral decubitus ulcer, recently discharged from hospital on 1/30 now presented back with septic shock. Palliative care has been consulted to further address goals of care given five hospitalizations in the past 6 months and poor overall progression in the setting of acute on chronic illness(s).   Clinical Assessment/Goals of Care:  *Please note that this is a verbal dictation therefore any spelling or grammatical errors are due to the "Bruce One" system interpretation.  I have reviewed medical records including EPIC notes, labs and imaging, received report from bedside RN, assessed the patient who is contracted lying in bed.    I met with Tonye Pearson to further discuss diagnosis prognosis, GOC, EOL wishes, disposition and options.   I introduced Palliative Medicine as specialized medical care for people living with serious illness. It focuses on providing relief from the symptoms and stress of a serious illness. The goal is to improve quality of life for both the patient and the family.  Medical History Review and Understanding:  Eleanor and I reviewed his past medical history significant for heart failure, type 2 diabetes, A-fib, coronary artery disease, and end-stage renal disease.  Social History:  Tiburcio previously lived in a single-family home in [place].  He is not married as his wife passed away.  He shares that he has 1 daughter and no grandchildren.  He worked for the [employer] throughout his career where he retired from.  He is someone who used to  enjoy golfing tremendously.  He is a man of faith and practices within the [employer] [facility] denomination.  Functional and Nutritional State:  Prior to hospitalization patient shares he had for period of time lived at home with the help and support of his niece though he suffered multiple falls while being there.  He shares that he did use a walker and per his vocalization he was "doing pretty well".  He did have a fair appetite preceding admission per his own self-report.  Advance Directives:  A detailed discussion was had today regarding advanced directives.  Patient expresses that he does have advanced directives and his healthcare power of attorney is his brother, [name] Cerveny.  Mel does consent to me calling [name].  Code Status:  Concepts specific to code status, artifical feeding and hydration, continued IV antibiotics and rehospitalization was had.  The difference between a aggressive medical intervention path  and a palliative comfort care path for this patient at this time was had.   Encouraged patient/family to consider DNR/DNI status understanding evidenced based poor outcomes in similar hospitalized patient, as the cause of arrest is likely associated with advanced chronic/terminal illness rather than an easily reversible acute cardio-pulmonary event. I explained that DNR/DNI does not change the medical plan and it only comes into effect after a person has arrested (died).  It is a protective measure to keep [place] from harming the patient in their last moments of life.   Joash has vocalized that he wants the medical team to "do what they can".  I asked him to elaborate on that.  He shares that he has a desire to  live.  I emphasized the possible trauma that we could cause him in the shell of a human that he would possibly be after such events.  We both determined that additional decisions could and should be made in the presence of his brother which he consents to.  Discussion:  Aadin shares  that his impression of his hospitalization is in the setting of a prior gallbladder infection.  He goes on to share that that now he is right here due to a wound infection.  I vocalized understanding and shared that it caused his whole body to become ill in fact so much so that he needed to be in the ICU for support of his blood pressure.  I shared with Haydan that he is very sick and I worry about his long-term health outlook.   Carold states that his goals are to golf again and to reconnect with a lady friend by the name of Santiago Glad.  I again stated that I think we need to have a meeting with people in his life who are important to him such as his brother [name] for further conversations.  Trust is in agreement with this.  Discussed the importance of continued conversation with family and their  medical providers regarding overall plan of care and treatment options, ensuring decisions are within the context of the patients values and GOCs.  ____________________________________________________ Addendum:  I called and spoke with patient's brother [name].  [name] expresses that he understands Jamil is incredibly ill and shares that the medical team has kept him up-to-date on his current diagnoses.  He realizes that he came into the hospital due to an infection from his ulcer.  [name] shares with me that his brother has been declining for a matter of months and is Circling in and out of the acute care hospital to a rehab and then the cycle repeats itself when something occurs to his health.  [name] does confirm that he is the patient's power of attorney and he plans to bring in the paperwork tomorrow.  We have agreed to have a meeting tomorrow at 2:00 for further conversations as they relate to cardiopulmonary resuscitation status and goals of care.  I did broach the possibility if patient continues to be in poor health and decline further that hospice would be the next best choice.  Patient's brother  is more than agreeable to this if this is what the medical team recommends.  I shared that all of [place] meeting together will hopefully provide clarity and help elucidate Jonathyn's understanding of his medical illnesses.  Decision Maker: Jameison, Haji (Brother): [phone] (Mobile)   SUMMARY OF RECOMMENDATIONS   Full code/Full scope of care --> for the time being until additional conversations can be held  Patient at this time appears to have a lack of insight into the severity of his illness therefore I have asked his brother, [name] to come to bedside tomorrow at 2 PM for further conversations  Ongoing palliative care support  Code Status/Advance Care Planning: FULL CODE  Palliative Prophylaxis:  Aspiration, Bowel Regimen, Delirium Protocol, Frequent Pain Assessment, Oral Care, Palliative Wound Care, and Turn Reposition  Additional Recommendations (Limitations, Scope, Preferences): Continue current care  Psycho-social/Spiritual:  Desire for further Chaplaincy support: Yes Additional Recommendations: Education on disease burden -severity of osteomyelitis most especially in the sacral area   Prognosis: Exceptionally poor overall patient is a good candidate for hospice  Discharge Planning: Discharge plan is uncertain at this time  Vitals:   01/25/23 0500 01/25/23  0600  BP: (!) 116/52 132/66  Pulse: 66 67  Resp: 14 14  Temp:    SpO2: 98% 99%    Intake/Output Summary (Last 24 hours) at 01/25/2023 0715 Last data filed at 01/25/2023 0600 Gross per 24 hour  Intake 465.8 ml  Output 350 ml  Net 115.8 ml   Last Weight  Most recent update: 01/25/2023  6:24 AM    Weight  85.5 kg (188 lb 7.9 oz)            Gen: Elderly African-American male in no distress HEENT: moist mucous membranes CV: Regular rate and rhythm  PULM: On room air breathing is even and unlabored ABD: soft/nontender  EXT: (+) trace BLE edema Neuro: Alert and oriented x2-3   PPS: 30%   This conversation/these  recommendations were discussed with patient primary care team, Dr. Bonner Puna  Billing based on MDM: High  Problems Addressed: One acute or chronic illness or injury that poses a threat to life or bodily function  Amount and/or Complexity of Data: Category 3:Discussion of management or test interpretation with external physician/other qualified health care professional/appropriate source (not separately reported)  Risks: Decision regarding hospitalization or escalation of hospital care and Decision not to resuscitate or to de-escalate care because of poor prognosis ______________________________________________________ [place] Team Team Cell Phone: [phone] Please utilize secure chat with additional questions, if there is no response within 30 minutes please call the above phone number  Palliative Medicine Team providers are available by phone from 7am to 7pm daily and can be reached through the team cell phone.  Should this patient require assistance outside of these hours, please call the patient's attending physician.

## 2023-01-25 NOTE — Progress Notes (Addendum)
[facility] NEPHROLOGY PROGRESS NOTE  Assessment/ Plan: Pt is a 77 y.o. yo male with ESRD on HD, CHF, DM, A-fib, CAD, mostly bedbound with physical deconditioning known large sacral decubitus ulcer, recently discharged from hospital on 1/30 now presented back with septic shock.  Dialysis Orders: [facility] MWF 4hr, 400/800, EDW 83kg, 3K/2.25Ca, R TDC, heparin 4000 unit bolus q HD - Full 4hr HD 1/31, was below new EDW - left 78.6kg - Mircera 225 mcg IV q 2 weeks (due 2/2, last given Aranesp 237mg on 1/26) - no VDRA  # Septic shock due to E. coli bacteremia/large decubiti ulcer with coccygeal osteomyelitis: The patient is off of pressors with soft BP.  On Cefepime.  Seen by general surgery recommended no acute surgical need at this time.  HD catheter was removed on 2/1 for line holiday.  Blood culture was repeated on 2/2, follow ID.  # ESRD MWF: He had a full dialysis on 1/31.  Now line holiday.  He is on room air, volume status acceptable.  I will await lab results from today.  I do not see any urgent indication for dialysis today.  I really hope that the culture remains negative in 48 hours before placing HD catheter.  Tentative plan is to place HD catheter on 2/5 and do dialysis unless there is urgent need before then.  I discussed this with the patient as well.  # Hypotension/volume: Required pressors which is off now.   # Secondary hyperparathyroidism: Calcium and phosphorus level acceptable.  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  # Goals of care: Patient with multiple admission, stage IV sacral decubiti, physical deconditioning.  I agree with palliative care consult.  Subjective: Seen and examined in ICU.  Continue to remain off of pressors.  Denies nausea, vomiting, chest pain, shortness of breath.  In room air. Objective Vital signs in last 24 hours: Vitals:   01/25/23 0400 01/25/23 0500 01/25/23 0600 01/25/23 0744   BP: 135/67 (!) 116/52 132/66   Pulse: 74 66 67   Resp: '11 14 14   '$ Temp:    97.8 F (36.6 C)  TempSrc:    Axillary  SpO2: 100% 98% 99%   Weight:  85.5 kg    Height:       Weight change:   Intake/Output Summary (Last 24 hours) at 01/25/2023 0840 Last data filed at 01/25/2023 0600 Gross per 24 hour  Intake 425.38 ml  Output 350 ml  Net 75.38 ml        Labs: RENAL PANEL Recent Labs    12/18/22 0431 12/19/22 0437 12/20/22 0424 12/21/22 0145 12/22/22 0122 12/23/22 0140 12/28/22 0219 12/29/22 0132 12/30/22 0156 12/31/22 0617 01/09/23 0217 01/10/23 0314 01/11/23 0227 01/12/23 0152 01/13/23 1624 01/15/23 1551 01/17/23 0742 01/20/23 0942 01/21/23 1055 01/22/23 1738 01/22/23 1746 01/22/23 1838 01/23/23 0448 01/24/23 0552 01/24/23 0602  NA 139 135 136 137 135   < >  --  137 140   < > 135 136 134* 134* 136 133* 134* 139 134* 138 135  --  134*  --  136  K 4.9 4.2 5.3* 3.6 3.4*   < >  --  4.0 4.4   < > 4.6 4.5 4.0 4.5 4.7 3.9 3.2* 4.6 4.2 4.7 6.6*  --  4.1  --  3.5  CL 106 98 102 98 100   < >  --  96* 100   < > 98 100 98  99 101 92* 95* 99 95* 98 102  --  97*  --  100  CO2 '28 29 25 28 25   '$ < >  --  28 27   < > '28 28 27 24 25 28 28 25 27 22  '$ --   --  25  --  23  GLUCOSE 87 81 110* 82 105*   < >  --  200* 171*   < > 77 84 89 126* 145* 125* 128* 139* 112* 129* 128* 179* 112*  --  89  BUN '19 12 20 11 16   '$ < >  --  67* 88*   < > 20 33* 20 36* 54* 20 49* 62* 39* 18 23  --  18  --  34*  CREATININE 3.71* 2.55* 3.55* 2.54* 3.30*   < >  --  3.89* 4.98*   < > 2.38* 3.48* 2.28* 3.40* 4.60* 2.25* 4.32* 5.03* 3.03* 1.96* 2.00*  --  2.31*  --  3.06*  CALCIUM 9.2 8.9 9.2 8.8* 8.9   < >  --  9.4 9.5   < > 9.6 9.9 9.3 9.6 10.1 9.5 10.0 10.4* 9.7 9.2  --   --  9.3  --  9.1  MG 1.9 1.6* 1.7 1.6* 1.6*  --  1.6* 2.1 2.0  --   --   --   --   --   --   --   --   --   --   --   --   --  1.5* 2.1  --   PHOS 3.6 3.1 4.4 3.1 2.9   < > 1.5* 1.6* 3.1  3.1   < > 4.0 5.3* 3.7 4.4 4.9* 2.9 4.6  --  3.7   --   --   --  2.8  --  3.1  ALBUMIN 1.9* 2.0* 2.3* 2.1* 2.2*   < >  --  2.1* 2.1*   < > 2.0* 1.9* 2.0* 1.9* 2.0* 2.1* 2.0*  --  2.1* 2.2*  --   --  1.8*  --  1.6*   < > = values in this interval not displayed.      Liver Function Tests: Recent Labs  Lab 01/22/23 1738 01/23/23 0448 01/24/23 0602  AST 45*  --   --   ALT 16  --   --   ALKPHOS 70  --   --   BILITOT 0.8  --   --   PROT 7.7  --   --   ALBUMIN 2.2* 1.8* 1.6*    No results for input(s): "LIPASE", "AMYLASE" in the last 168 hours. No results for input(s): "AMMONIA" in the last 168 hours. CBC: Recent Labs    07/17/22 1404 07/28/22 2328 09/15/22 0743 09/16/22 0442 11/29/22 1422 12/12/22 0637 12/22/22 0122 12/23/22 0140 01/22/23 1738 01/22/23 1746 01/23/23 0448 01/24/23 0552 01/24/23 0602 01/24/23 1920  HGB 8.4*   < >  --    < >  --    < > 8.1*   < > 9.4* 10.5* 8.5*  --  7.1* 8.3*  MCV 99.2   < >  --    < >  --    < > 102.7*   < > 104.0*  --  103.0*  --  102.3*  --   VITAMINB12 118*  --  437  --   --   --   --   --   --   --   --   --  328  --   FOLATE 9.1  --  6.7  --   --   --   --   --   --   --   --  5.7*  --   --   FERRITIN 352*  --  986*  --   --   --  1,139*  --   --   --   --  2,186*  --   --   TIBC 205*  --  144*  --  177*  --  105*  --   --   --   --  UNABLE TO CALCULATE  --   --   IRON 17*  --  13*  --  37*  --  20*  --   --   --   --  25*  --   --   RETICCTPCT 1.7  --  2.0  --   --   --   --   --   --   --   --  1.9  --   --    < > = values in this interval not displayed.     Cardiac Enzymes: No results for input(s): "CKTOTAL", "CKMB", "CKMBINDEX", "TROPONINI" in the last 168 hours. CBG: Recent Labs  Lab 01/24/23 1518 01/24/23 1919 01/24/23 2318 01/25/23 0339 01/25/23 0741  GLUCAP 109* 115* 92 78 91     Iron Studies:  Recent Labs    01/24/23 0552  IRON 25*  TIBC UNABLE TO CALCULATE  FERRITIN 2,186*   Studies/Results: VAS [place] UPPER EXTREMITY VENOUS DUPLEX  Result Date:  01/24/2023 UPPER VENOUS STUDY  Patient Name:  MATEI MAGNONE  Date of Exam:   01/23/2023 Medical Rec #: [id number]        Accession #:    [id number] Date of Birth: 1946/10/13        Patient Gender: M Patient Age:   47 years Exam Location:  [facility] Procedure:      VAS [place] UPPER EXTREMITY VENOUS DUPLEX Referring Phys: Eddie Dibbles HOFFMAN --------------------------------------------------------------------------------  Indications: Edema Risk Factors: None identified. Limitations: Poor ultrasound/tissue interface, bandages and line. Comparison Study: No prior studies. Performing Technologist: Oliver Hum RVT  Examination Guidelines: A complete evaluation includes B-mode imaging, spectral Doppler, color Doppler, and power Doppler as needed of all accessible portions of each vessel. Bilateral testing is considered an integral part of a complete examination. Limited examinations for reoccurring indications may be performed as noted.  Right Findings: +----------+------------+---------+-----------+----------+-------+ RIGHT     CompressiblePhasicitySpontaneousPropertiesSummary +----------+------------+---------+-----------+----------+-------+ IJV           Full       Yes       Yes                      +----------+------------+---------+-----------+----------+-------+ Subclavian    Full       Yes       Yes                      +----------+------------+---------+-----------+----------+-------+ Axillary      Full       Yes       Yes                      +----------+------------+---------+-----------+----------+-------+ Brachial      Full       Yes       Yes                      +----------+------------+---------+-----------+----------+-------+  Radial        Full                                          +----------+------------+---------+-----------+----------+-------+ Ulnar         Full                                           +----------+------------+---------+-----------+----------+-------+ Cephalic      None                                   Acute  +----------+------------+---------+-----------+----------+-------+ Basilic       Full                                          +----------+------------+---------+-----------+----------+-------+ Thrombus detected in the cephalic vein is noted to only be in the mid forearm.  Left Findings: +----------+------------+---------+-----------+----------+-------+ LEFT      CompressiblePhasicitySpontaneousPropertiesSummary +----------+------------+---------+-----------+----------+-------+ Subclavian    Full       Yes       Yes                      +----------+------------+---------+-----------+----------+-------+  Summary:  Right: No evidence of deep vein thrombosis in the upper extremity. Findings consistent with acute superficial vein thrombosis involving the right cephalic vein.  Left: No evidence of thrombosis in the subclavian.  *See table(s) above for measurements and observations.  Diagnosing physician: Jamelle Haring Electronically signed by Jamelle Haring on 01/24/2023 at 7:30:04 AM.    Final    IR Removal Tun Cv Cath W/O FL  Result Date: 01/23/2023 INDICATION: 78 year old with gram negative bacteremia in the setting of tunneled dialysis catheter placement as well as sacral wound. EXAM: REMOVAL TUNNELED CENTRAL VENOUS CATHETER MEDICATIONS: 5 mL 1% lidocaine ANESTHESIA/SEDATION: None FLUOROSCOPY: None COMPLICATIONS: None immediate. PROCEDURE: Informed written consent was obtained from the patient after a thorough discussion of the procedural risks, benefits and alternatives. All questions were addressed. Maximal Sterile Barrier Technique was utilized including caps, mask, sterile gowns, sterile gloves, sterile drape, hand hygiene and skin antiseptic. A timeout was performed prior to the initiation of the procedure. The patient's right chest and catheter was prepped  and draped in a normal sterile fashion. Heparin was removed from both ports of catheter. 1% lidocaine was used for local anesthesia. Using gentle blunt dissection the cuff of the catheter was exposed and the catheter was removed in it's entirety. Pressure was held till hemostasis was obtained. A sterile dressing was applied. The patient tolerated the procedure well with no immediate complications. IMPRESSION: Successful catheter removal as described above. Read by: Brynda Greathouse PA-C Electronically Signed   By: Ruthann Cancer M.D.   On: 01/23/2023 20:29    Medications: Infusions:  sodium chloride Stopped (01/24/23 2146)   ceFEPime (MAXIPIME) IV Stopped (01/24/23 1954)    Scheduled Medications:  (feeding supplement) PROSource Plus  30 mL Oral BID BM   acetaminophen  1,000 mg Oral Q8H   aspirin EC  81 mg Oral Daily   atorvastatin  40 mg Oral Daily   Chlorhexidine Gluconate Cloth  6 each Topical Daily   Chlorhexidine Gluconate Cloth  6 each Topical Q0600   darbepoetin (ARANESP) injection - DIALYSIS  100 mcg Subcutaneous Q Fri-1800   feeding supplement  237 mL Oral TID BM   folic acid  1 mg Oral Daily   gabapentin  100 mg Oral TID   heparin  5,000 Units Subcutaneous Q8H   leptospermum manuka honey  1 Application Topical Daily   metroNIDAZOLE  500 mg Oral Q12H   midodrine  10 mg Oral Q M,W,F-HD   midodrine  10 mg Oral Q Sat-HD   multivitamin  1 tablet Oral QHS   pantoprazole (PROTONIX) IV  40 mg Intravenous Daily   sodium hypochlorite   Irrigation Q1200    have reviewed scheduled and prn medications.  Physical Exam: General: Elderly looking frail male lying on bed in ICU. Heart:RRR, s1s2 nl Lungs:clear b/l, no crackle Abdomen:soft, Non-tender, non-distended Extremities:No edema Dialysis Access: HD catheter was removed, no access at the moment.  Garrie Woodin Prasad Dwyane Dupree 01/25/2023,8:40 AM  LOS: 3 days

## 2023-01-26 DIAGNOSIS — B9620 Unspecified Escherichia coli [E. coli] as the cause of diseases classified elsewhere: Secondary | ICD-10-CM | POA: Diagnosis not present

## 2023-01-26 DIAGNOSIS — Z515 Encounter for palliative care: Secondary | ICD-10-CM | POA: Diagnosis not present

## 2023-01-26 DIAGNOSIS — R7881 Bacteremia: Secondary | ICD-10-CM | POA: Diagnosis not present

## 2023-01-26 DIAGNOSIS — Z7189 Other specified counseling: Secondary | ICD-10-CM | POA: Diagnosis not present

## 2023-01-26 LAB — RENAL FUNCTION PANEL
Albumin: 1.6 g/dL — ABNORMAL LOW (ref 3.5–5.0)
Anion gap: 11 (ref 5–15)
BUN: 56 mg/dL — ABNORMAL HIGH (ref 8–23)
CO2: 23 mmol/L (ref 22–32)
Calcium: 9 mg/dL (ref 8.9–10.3)
Chloride: 101 mmol/L (ref 98–111)
Creatinine, Ser: 3.89 mg/dL — ABNORMAL HIGH (ref 0.61–1.24)
GFR, Estimated: 15 mL/min — ABNORMAL LOW (ref >60)
Glucose, Bld: 92 mg/dL (ref 70–99)
Phosphorus: 3.6 mg/dL (ref 2.5–4.6)
Potassium: 3.8 mmol/L (ref 3.5–5.1)
Sodium: 135 mmol/L (ref 135–145)

## 2023-01-26 LAB — GLUCOSE, CAPILLARY
Glucose-Capillary: 102 mg/dL — ABNORMAL HIGH (ref 70–99)
Glucose-Capillary: 118 mg/dL — ABNORMAL HIGH (ref 70–99)

## 2023-01-26 MED ORDER — CEFAZOLIN SODIUM-DEXTROSE 2-4 GM/100ML-% IV SOLN
2.0000 g | INTRAVENOUS | Status: AC
  Administered 2023-01-27: 2 g via INTRAVENOUS
  Filled 2023-01-26: qty 100

## 2023-01-26 MED ORDER — CHLORHEXIDINE GLUCONATE CLOTH 2 % EX PADS
6.0000 | MEDICATED_PAD | Freq: Every day | CUTANEOUS | Status: DC
  Administered 2023-01-26 – 2023-01-29 (×4): 6 via TOPICAL

## 2023-01-26 NOTE — Progress Notes (Signed)
Palliative Medicine Inpatient Follow Up Note   HPI: Pt is a 77 y.o. yo male with ESRD on HD, CHF, DM, A-fib, CAD, mostly bedbound with physical deconditioning known large sacral decubitus ulcer, recently discharged from hospital on 1/30 now presented back with septic shock. Palliative care has been consulted to further address goals of care given five hospitalizations in the past 6 months and poor overall progression in the setting of acute on chronic illness(s).    Today's Discussion 01/26/2023  *Please note that this is a verbal dictation therefore any spelling or grammatical errors are due to the "[place] [employer]" system interpretation.  Chart reviewed inclusive of vital signs, progress notes, laboratory results, and diagnostic images.   A family meeting was held this afternoon with [name], his brother, [name], [place], and niece.   We discussed in great detail [name]'s past medical history inclusive of his heart failure, diabetes, atrial fibrillation, coronary artery disease, end-stage renal disease, recurrent rehospitalization's due to gallstones as well as sacral ulcer infection.  Reviewed at this time patient is being treated for 2 bacteria as affecting his bones.  We discussed the significance of osteomyelitis and how when Adaflex bones and lower extremities typically you pursue things like amputation.  I shared that being that [name]'s infection is on his sacral area there is no amputation to be had and it is very likely that even after a thorough course of antibiotics there would be a recurrence.  Patient's family was vocal about the reality that he has been declining for roughly 2 years now.  He has been in and out of the hospital and well at skilled nursing most recently he insisted on going home where he was total care.  His niece who is caring for him unfortunately pulled her own back out after he suffered a fall when she was trying to get him back up.  Patient's family are clear  about the reality that there are no additional placement options for him other than him living at a skilled nursing facility.  We discussed the many things which are complicating [name]'s health at this time and I was able to show patient and family pictures of his sacral ulcer.  I described this in great detail as well as the infection as above.  I shared my great concern that he will continue to cycle into and out of the hospital with no end in sight because he is so chronically ill.  Patient's insight is exceptionally poor and he continues to tell me that he is not sick.He does seem to recount his reason for hospitalization though and can vocalize what I am telling him in his own terms. He shares that he is not ready to die.  Patient clearly states the desire to remain full code at this time. He wants to continue dialysis and does not desire hospice at this time.   I shared I do worry about his dialysis as he is so severely deconditioned at this time. I am concerned about his ability to sit in an HD chair for a prolonged period of time. His SIL shares he has had increasing trouble with this.   Created space and opportunity for patient to explore thoughts feelings and fears regarding current medical situation. Patient becomes more withdrawn the longer we talk.   I shared that I think he needs time to digest the provided information.   __________________________________ Addendum:  I met with patients brother, [name]  and SIL outside the room. They  are very well aware of his poor overall condition.  Patients brother wants to honor his wishes for the time being though if patient becomes incoherent for any reason he would not desire prolongation of his brothers suffering.   [name] is very clear that his brother has never liked talking about matters at the relate to end of life.   Questions and concerns addressed/Palliative Support Provided.   Objective Assessment: Vital Signs Vitals:    01/26/23 0800 01/26/23 1222  BP: 126/67 129/71  Pulse: 86 88  Resp: 17 16  Temp: 97.9 F (36.6 C) 97.6 F (36.4 C)  SpO2: 99% 99%    Intake/Output Summary (Last 24 hours) at 01/26/2023 1553 Last data filed at 01/26/2023 1318 Gross per 24 hour  Intake 220 ml  Output 450 ml  Net -230 ml   Last Weight  Most recent update: 01/26/2023  3:50 AM    Weight  86.3 kg (190 lb 4.1 oz)            Gen: Elderly African-American male in no distress HEENT: moist mucous membranes CV: Regular rate and rhythm  PULM: On room air breathing is even and unlabored ABD: soft/nontender  EXT: (+) trace BLE edema Neuro: Alert and oriented x3   SUMMARY OF RECOMMENDATIONS   Full code/Full scope of care  Continue current care  Open and honest conversations held in the setting of patients acute on chronic illness   Patient remains to have poor insight regarding his health though does appear to have decisional capacity as of this afternoon   Ongoing palliative care support  Time: 68 Billing based on MDM: High  Problems Addressed: One acute or chronic illness or injury that poses a threat to life or bodily function  Amount and/or Complexity of Data: Category 3:Discussion of management or test interpretation with external physician/other qualified health care professional/appropriate source (not separately reported)  Risks: Decision regarding hospitalization or escalation of hospital care and Decision not to resuscitate or to de-escalate care because of poor prognosis ______________________________________________________________________________________ [facility] Team Team Cell Phone: [phone] Please utilize secure chat with additional questions, if there is no response within 30 minutes please call the above phone number  Palliative Medicine Team providers are available by phone from 7am to 7pm daily and can be reached through the team cell phone.  Should  this patient require assistance outside of these hours, please call the patient's attending physician.

## 2023-01-26 NOTE — Progress Notes (Signed)
[facility] NEPHROLOGY PROGRESS NOTE  Assessment/ Plan: Pt is a 77 y.o. yo male with ESRD on HD, CHF, DM, A-fib, CAD, mostly bedbound with physical deconditioning known large sacral decubitus ulcer, recently discharged from hospital on 1/30 now presented back with septic shock.  Dialysis Orders: [facility] MWF 4hr, 400/800, EDW 83kg, 3K/2.25Ca, R TDC, heparin 4000 unit bolus q HD - Full 4hr HD 1/31, was below new EDW - left 78.6kg - Mircera 225 mcg IV q 2 weeks (due 2/2, last given Aranesp 250mg on 1/26) - no VDRA  # Septic shock due to E. coli bacteremia/large decubiti ulcer with coccygeal osteomyelitis: The patient is off of pressors, on Cefepime.  Seen by general surgery recommended no acute surgical need at this time.  HD catheter was removed on 2/1 for line holiday.  Blood culture was repeated on 2/2, follow ID.  # ESRD MWF: He had a full dialysis on 1/31.  Now line holiday.  He is on room air and the lab looks unremarkable therefore no need for dialysis today.  So far the cultures are negative.  If cultures are negative by tomorrow morning I think we can place tunneled HD catheter.  I will consult IR.  Plan for HD tomorrow after catheter placement.  # Hypotension/volume: Required pressors which is off now.  Midodrine pre-HD.  # Secondary hyperparathyroidism: Calcium and phosphorus level acceptable.  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  # Goals of care: Patient with multiple admission, stage IV sacral decubiti, physical deconditioning.  Palliative care team is planning to have family meeting today.  Subjective: Seen and examined at the bedside.  He was transferred out of ICU to the floor.  Denies nausea, vomiting, chest pain, shortness of breath.  No new event. Objective Vital signs in last 24 hours: Vitals:   01/25/23 2000 01/25/23 2314 01/26/23 0349 01/26/23 0800  BP: 123/69 117/62 (!) 143/64 126/67  Pulse: 86 85  80 86  Resp: '19 20 16 17  '$ Temp: 99.4 F (37.4 C)  98.4 F (36.9 C) 97.9 F (36.6 C)  TempSrc: Axillary  Axillary Oral  SpO2: 100% 99% 100% 99%  Weight:   86.3 kg   Height:       Weight change: 0.8 kg  Intake/Output Summary (Last 24 hours) at 01/26/2023 0943 Last data filed at 01/26/2023 0432 Gross per 24 hour  Intake 557 ml  Output --  Net 557 ml        Labs: RENAL PANEL Recent Labs    12/18/22 0431 12/19/22 0437 12/20/22 0424 12/21/22 0145 12/22/22 0122 12/23/22 0140 12/28/22 0219 12/29/22 0132 12/30/22 0156 12/31/22 0617 01/11/23 0227 01/12/23 0152 01/13/23 1624 01/15/23 1551 01/17/23 0742 01/20/23 0942 01/21/23 1055 01/22/23 1738 01/22/23 1746 01/22/23 1838 01/23/23 0448 01/24/23 0552 01/24/23 0602 01/25/23 1243 01/26/23 0104  NA 139 135 136 137 135   < >  --  137 140   < > 134* 134* 136 133* 134* 139 134* 138 135  --  134*  --  136 133* 135  K 4.9 4.2 5.3* 3.6 3.4*   < >  --  4.0 4.4   < > 4.0 4.5 4.7 3.9 3.2* 4.6 4.2 4.7 6.6*  --  4.1  --  3.5 3.8 3.8  CL 106 98 102 98 100   < >  --  96* 100   < > 98 99 101 92* 95* 99 95* 98 102  --  97*  --  100 98 101  CO2 '28 29 25 28 25   '$ < >  --  28 27   < > '27 24 25 28 28 25 27 22  '$ --   --  25  --  23 21* 23  GLUCOSE 87 81 110* 82 105*   < >  --  200* 171*   < > 89 126* 145* 125* 128* 139* 112* 129* 128* 179* 112*  --  89 119* 92  BUN '19 12 20 11 16   '$ < >  --  67* 88*   < > 20 36* 54* 20 49* 62* 39* 18 23  --  18  --  34* 51* 56*  CREATININE 3.71* 2.55* 3.55* 2.54* 3.30*   < >  --  3.89* 4.98*   < > 2.28* 3.40* 4.60* 2.25* 4.32* 5.03* 3.03* 1.96* 2.00*  --  2.31*  --  3.06* 3.73* 3.89*  CALCIUM 9.2 8.9 9.2 8.8* 8.9   < >  --  9.4 9.5   < > 9.3 9.6 10.1 9.5 10.0 10.4* 9.7 9.2  --   --  9.3  --  9.1 9.3 9.0  MG 1.9 1.6* 1.7 1.6* 1.6*  --  1.6* 2.1 2.0  --   --   --   --   --   --   --   --   --   --   --  1.5* 2.1  --   --   --   PHOS 3.6 3.1 4.4 3.1 2.9   < > 1.5* 1.6* 3.1  3.1   < > 3.7 4.4 4.9* 2.9 4.6  --  3.7   --   --   --  2.8  --  3.1 3.6 3.6  ALBUMIN 1.9* 2.0* 2.3* 2.1* 2.2*   < >  --  2.1* 2.1*   < > 2.0* 1.9* 2.0* 2.1* 2.0*  --  2.1* 2.2*  --   --  1.8*  --  1.6* 1.7* 1.6*   < > = values in this interval not displayed.      Liver Function Tests: Recent Labs  Lab 01/22/23 1738 01/23/23 0448 01/24/23 0602 01/25/23 1243 01/26/23 0104  AST 45*  --   --   --   --   ALT 16  --   --   --   --   ALKPHOS 70  --   --   --   --   BILITOT 0.8  --   --   --   --   PROT 7.7  --   --   --   --   ALBUMIN 2.2*   < > 1.6* 1.7* 1.6*   < > = values in this interval not displayed.    No results for input(s): "LIPASE", "AMYLASE" in the last 168 hours. No results for input(s): "AMMONIA" in the last 168 hours. CBC: Recent Labs    07/17/22 1404 07/28/22 2328 09/15/22 0743 09/16/22 0442 11/29/22 1422 12/12/22 5093 12/22/22 0122 12/23/22 0140 01/22/23 1746 01/23/23 0448 01/24/23 0552 01/24/23 0602 01/24/23 1920 01/25/23 1243  HGB 8.4*   < >  --    < >  --    < > 8.1*   < > 10.5* 8.5*  --  7.1* 8.3* 9.9*  MCV 99.2   < >  --    < >  --    < > 102.7*   < >  --  103.0*  --  102.3*  --  97.7  VITAMINB12 118*  --  437  --   --   --   --   --   --   --   --  328  --   --   FOLATE 9.1  --  6.7  --   --   --   --   --   --   --  5.7*  --   --   --   FERRITIN 352*  --  986*  --   --   --  1,139*  --   --   --  2,186*  --   --   --   TIBC 205*  --  144*  --  177*  --  105*  --   --   --  UNABLE TO CALCULATE  --   --   --   IRON 17*  --  13*  --  37*  --  20*  --   --   --  25*  --   --   --   RETICCTPCT 1.7  --  2.0  --   --   --   --   --   --   --  1.9  --   --   --    < > = values in this interval not displayed.     Cardiac Enzymes: No results for input(s): "CKTOTAL", "CKMB", "CKMBINDEX", "TROPONINI" in the last 168 hours. CBG: Recent Labs  Lab 01/24/23 1919 01/24/23 2318 01/25/23 0339 01/25/23 0741 01/25/23 1126  GLUCAP 115* 92 78 91 108*     Iron Studies:  Recent Labs     01/24/23 0552  IRON 25*  TIBC UNABLE TO CALCULATE  FERRITIN 2,186*    Studies/Results: No results found.  Medications: Infusions:  sodium chloride Stopped (01/24/23 2146)   ceFEPime (MAXIPIME) IV Stopped (01/25/23 2054)    Scheduled Medications:  (feeding supplement) PROSource Plus  30 mL Oral BID BM   acetaminophen  1,000 mg Oral Q8H   aspirin EC  81 mg Oral Daily   atorvastatin  40 mg Oral Daily   Chlorhexidine Gluconate Cloth  6 each Topical Daily   Chlorhexidine Gluconate Cloth  6 each Topical Q0600   darbepoetin (ARANESP) injection - DIALYSIS  100 mcg Subcutaneous Q Fri-1800   feeding supplement  237 mL Oral TID BM   folic acid  1 mg Oral Daily   gabapentin  100 mg Oral TID   heparin  5,000 Units Subcutaneous Q8H   isosorbide dinitrate  5 mg Oral BID   leptospermum manuka honey  1 Application Topical Daily   metroNIDAZOLE  500 mg Oral Q12H   midodrine  10 mg Oral Q M,W,F-HD   midodrine  10 mg Oral Q Sat-HD   multivitamin  1 tablet Oral QHS   pantoprazole (PROTONIX) IV  40 mg Intravenous Daily   sodium hypochlorite   Irrigation Q1200    have reviewed scheduled and prn medications.  Physical Exam: General: Elderly looking frail male lying on bed in ICU. Heart:RRR, s1s2 nl Lungs:clear b/l, no crackle Abdomen:soft, Non-tender, non-distended Extremities:No edema Dialysis Access: HD catheter was removed, no access at the moment.  Amonda Brillhart Prasad Juaquin [place] 01/26/2023,9:43 AM  LOS: 4 days

## 2023-01-26 NOTE — Consult Note (Addendum)
Chief Complaint: Patient was seen in consultation today for tunneled vs non tunneled dialysis catheter placement Chief Complaint  Patient presents with   Altered Mental Status   at the request of Dr Carolin Sicks   Supervising Physician: Michaelle Birks  Patient Status: [facility] - In-pt  History of Present Illness: [name] is a 77 y.o. male   ESRD; CHF; Afib; CAD Deconditioning Large sacral decub ulcer Sepsis/ ecoli bacteremia Had full dialysis 1/31 Tunneled dialysis catheter removed 2/1 in IR    Cxs neg to date Afeb Wbc 17.2 Nephrology requesting new placement tunneled dialysis catheter 2/5 in IR   Vs non tunneled dialysis catheter placement   Past Medical History:  Diagnosis Date   Anginal pain (HCC)    Anxiety    Benign prostatic hypertrophy    Nocturia   CAD (coronary artery disease)    a.  NSTEMI 10/2012 s/p DES to LAD & DES to 1st diagonal with residual diffuse nonobstructive dz in LCx/RCA; b. 04/2022 MV: EF 55%, prior inferior MI. Small apical infarct w/ mild peri-infarct ischemia.   Cardiomyopathy ([place])    a. 03/2009 Echo: EF 50-55%; b. 01/2022 Echo: EF 60-65%; c. 07/2022 Echo: EF 35-40%, glob HK, worse @ apex. In Afib during study.   Degenerative joint disease    Depression    hx of   Diabetes mellitus, type II ([place]) 12/10/2012   Erectile dysfunction    ESRD (end stage renal disease) ([place])    a. HD initiated 07/2022.   Flu 2013   hx of   GERD (gastroesophageal reflux disease)    Gout    Hemorrhoids    High cholesterol    Hypertension    Exercise induced   Myocardial infarction [facility])    Neuropathy    bilateral legs   NSVT (nonsustained ventricular tachycardia) (HCC)    Exercise induced   Obesity    PAF (paroxysmal atrial fibrillation) ([place])    a. CHA2DS2VASc = 6.  No OAC   Spinal cord stimulator    Tubular adenoma 2014   Vertigo    everyday    Past Surgical History:  Procedure Laterality Date   BACK SURGERY  2007   x2   BIOPSY  02/05/2021    Procedure: BIOPSY;  Surgeon: Daneil Dolin, MD;  [facility];  Service: Endoscopy;;   CARDIAC CATHETERIZATION     with stent placement   CHOLECYSTECTOMY N/A 12/19/2022   Procedure: LAPAROSCOPIC CHOLECYSTECTOMY;  Surgeon: Stark Klein, MD;  Location: [place];  Service: General;  Laterality: N/A;   COLONOSCOPY WITH ESOPHAGOGASTRODUODENOSCOPY (EGD) N/A 11/23/2013   Dr. Oneida Alar- TCS= left sided colitis and mild proctitis likely due to ischemic colitis, moderate internal hemorrhoids, small nodule in the rectum, bx=tubular adenoma, EGD=-mild non-erosive gastritis   COLONOSCOPY WITH PROPOFOL N/A 07/24/2020   hemorrhoids,  rectosigmoid lipoma, small adenoma removed.   CORONARY ANGIOPLASTY     ESOPHAGOGASTRODUODENOSCOPY (EGD) WITH PROPOFOL N/A 02/05/2021   Normal esophagus, erythematous mucosa in entire stomach, no obvious ulcer, s/p biopsy. Normal duodenum. Negative H.pylori.    HERNIA REPAIR     INSERTION OF DIALYSIS CATHETER N/A 08/06/2022   Procedure: INSERTION OF DIALYSIS CATHETER;  Surgeon: Rosetta Posner, MD;  Location: [facility];  Service: Vascular;  Laterality: N/A;   IR EXCHANGE BILIARY DRAIN  10/17/2022   IR PERC CHOLECYSTOSTOMY  08/20/2022   IR REMOVAL TUN CV CATH W/O FL  01/23/2023   JOINT REPLACEMENT     LEFT HEART CATH AND CORONARY  ANGIOGRAPHY N/A 12/17/2022   Procedure: LEFT HEART CATH AND CORONARY ANGIOGRAPHY;  Surgeon: Leonie Man, MD;  Location: [place] CV LAB;  Service: Cardiovascular;  Laterality: N/A;   LEFT HEART CATHETERIZATION WITH CORONARY ANGIOGRAM N/A 11/16/2012   Procedure: LEFT HEART CATHETERIZATION WITH CORONARY ANGIOGRAM;  Surgeon: Sherren Mocha, MD;  Location: [facility] CATH LAB;  Service: Cardiovascular;  Laterality: N/A;   PERCUTANEOUS CORONARY STENT INTERVENTION (PCI-S)  11/16/2012   Procedure: PERCUTANEOUS CORONARY STENT INTERVENTION (PCI-S);  Surgeon: Sherren Mocha, MD;  Location: [facility] CATH LAB;  Service: Cardiovascular;;   POLYPECTOMY  07/24/2020    Procedure: POLYPECTOMY;  Surgeon: Daneil Dolin, MD;  [facility];  Service: Endoscopy;;   SPINAL CORD STIMULATOR INSERTION N/A 06/10/2022   Procedure: Spinal cord stimulator placement;  Surgeon: Newman Pies, MD;  Location: [place];  Service: Neurosurgery;  Laterality: N/A;  RM 19/ to follow   THUMB AMPUTATION Left    50 years ago   TOTAL KNEE ARTHROPLASTY Right 11/15/2013   Procedure: RIGHT TOTAL KNEE ARTHROPLASTY;  Surgeon: Mauri Pole, MD;  Location: [facility];  Service: Orthopedics;  Laterality: Right;   TOTAL KNEE ARTHROPLASTY Left 03/07/2015   Procedure: LEFT TOTAL KNEE ARTHROPLASTY;  Surgeon: Paralee Cancel, MD;  Location: [facility];  Service: Orthopedics;  Laterality: Left;    Allergies: Metformin, Niacin, and Niacin and related  Medications: Prior to Admission medications   Medication Sig Start Date End Date Taking? Authorizing Provider  acetaminophen (TYLENOL) 500 MG tablet Take 2 tablets (1,000 mg total) by mouth every 6 (six) hours. 08/10/22  Yes Shahmehdi, Seyed A, MD  allopurinol (ZYLOPRIM) 100 MG tablet Take 100 mg by mouth daily. 08/17/20  Yes [provider]  aspirin EC 81 MG tablet Take 1 tablet (81 mg total) by mouth daily. 09/14/19  Yes Herminio Commons, MD  atorvastatin (LIPITOR) 40 MG tablet Take 1 tablet (40 mg total) by mouth daily. 01/10/23  Yes Bonnielee Haff, MD  calcitRIOL (ROCALTROL) 0.25 MCG capsule Take 0.25 mcg by mouth every Sunday. 12/13/16  Yes [provider]  carboxymethylcellulose (REFRESH PLUS) 0.5 % SOLN Place 1 drop into both eyes daily as needed (dry eyes).   Yes [provider]  ferrous sulfate (FEROSUL) 325 (65 FE) MG tablet Take 325 mg by mouth daily.   Yes [provider]  gabapentin (NEURONTIN) 100 MG capsule Take 1 capsule (100 mg total) by mouth 3 (three) times daily. 01/09/23  Yes Bonnielee Haff, MD  isosorbide dinitrate (ISORDIL) 5 MG tablet Take 1 tablet (5 mg total) by mouth 2 (two) times  daily. 01/09/23  Yes Bonnielee Haff, MD  meclizine (ANTIVERT) 12.5 MG tablet Take 1 tablet (12.5 mg total) by mouth 3 (three) times daily as needed for dizziness. 09/16/22  Yes Shah, Pratik D, DO  melatonin 5 MG TABS Take 6 mg by mouth at bedtime.   Yes [provider]  midodrine (PROAMATINE) 10 MG tablet Take 1 tablet (10 mg total) by mouth every Monday, Wednesday, and Friday with hemodialysis. 01/22/23  Yes Shawna Clamp, MD  Multiple Vitamins-Minerals [facility] COMPLETE PO) Take 1 tablet by mouth daily.   Yes [provider]  nitroGLYCERIN (NITROSTAT) 0.4 MG SL tablet Place 0.4 mg under the tongue every 5 (five) minutes as needed for chest pain.   Yes [provider]  Nystatin (GERHARDT'S BUTT CREAM) CREA Apply 1 Application topically 2 (two) times daily. 01/09/23  Yes Bonnielee Haff, MD  ondansetron (ZOFRAN) 4 MG tablet Take 4 mg  by mouth every 6 (six) hours as needed for nausea or vomiting.   Yes [provider]  oxyCODONE (OXY IR/ROXICODONE) 5 MG immediate release tablet Take 0.5-1 tablets (2.5-5 mg total) by mouth every 4 (four) hours as needed for moderate pain. Patient taking differently: Take 5 mg by mouth every 4 (four) hours as needed for moderate pain. 01/16/23  Yes Bonnielee Haff, MD  polyethylene glycol (MIRALAX / GLYCOLAX) 17 g packet Take 17 g by mouth 2 (two) times daily. 01/16/23  Yes Bonnielee Haff, MD  prochlorperazine (COMPAZINE) 5 MG tablet Take 1 tablet (5 mg total) by mouth every 6 (six) hours as needed for nausea or vomiting. 01/16/23  Yes Bonnielee Haff, MD  RESTASIS 0.05 % ophthalmic emulsion Place 1 drop into both eyes 2 (two) times daily. 06/19/22  Yes [provider]  senna-docusate (SENOKOT-S) 8.6-50 MG tablet Take 2 tablets by mouth 2 (two) times daily. 01/09/23  Yes Bonnielee Haff, MD  simethicone (MYLICON) 80 MG chewable tablet Chew 80 mg by mouth every 6 (six) hours as needed for flatulence.   Yes [provider]   Darbepoetin Alfa (ARANESP) 200 MCG/0.4ML SOSY injection Inject 0.4 mLs (200 mcg total) into the skin every Friday at 6 PM. 01/10/23   Bonnielee Haff, MD  feeding supplement (ENSURE ENLIVE / ENSURE PLUS) LIQD Take 237 mLs by mouth 3 (three) times daily with meals. 01/09/23   Bonnielee Haff, MD  leptospermum manuka honey (MEDIHONEY) PSTE paste Apply 1 Application topically daily. Patient not taking: Reported on 01/23/2023 01/10/23   Bonnielee Haff, MD  multivitamin (RENA-VIT) TABS tablet Take 1 tablet by mouth at bedtime. 01/09/23   Bonnielee Haff, MD  SANTYL 250 UNIT/GM ointment Apply topically daily. Patient not taking: Reported on 01/23/2023 09/21/22   [provider]  TRUE METRIX BLOOD GLUCOSE TEST test strip USE TO TEST BLOOD SUGAR UP TO TWICE DAILY OR AS DIRECTED 08/31/21   Renato Shin, MD     Family History  Problem Relation Age of Onset   Hypertension Other    Cancer Other    Heart disease Other    Diabetes Other    Hypertension Mother    Cancer Father        brain   CAD Brother    Colon cancer Neg Hx     Social History   Socioeconomic History   Marital status: Widowed    Spouse name: Not on file   Number of children: Not on file   Years of education: Not on file   Highest education level: Not on file  Occupational History   Occupation: Disabled    Employer: RETIRED  Tobacco Use   Smoking status: Former    Packs/day: 1.00    Years: 30.00    Total pack years: 30.00    Types: Cigarettes    Start date: 01/22/1965    Quit date: 12/23/1992    Years since quitting: 30.1   Smokeless tobacco: Never  Vaping Use   Vaping Use: Never used  Substance and Sexual Activity   Alcohol use: No    Alcohol/week: 0.0 standard drinks of alcohol   Drug use: No   Sexual activity: Not Currently  Other Topics Concern   Not on file  Social History Narrative   Married   Right handed   One story home no caffeine   Social Determinants of Health   Financial Resource Strain: Not  on file  Food Insecurity: No Food Insecurity (01/05/2023)   Hunger Vital Sign  Worried About Charity fundraiser in the Last Year: Never true    [place] in the Last Year: Never true  Transportation Needs: No Transportation Needs (12/17/2022)   PRAPARE - Hydrologist (Medical): No    Lack of Transportation (Non-Medical): No  Physical Activity: Not on file  Stress: Not on file  Social Connections: Not on file    Review of Systems: A 12 point ROS discussed and pertinent positives are indicated in the HPI above.  All other systems are negative.  Vital Signs: BP 126/67 (BP Location: Left Arm)   Pulse 86   Temp 97.9 F (36.6 C) (Oral)   Resp 17   Ht '6\' 3"'$  (1.905 m)   Wt 190 lb 4.1 oz (86.3 kg)   SpO2 99%   BMI 23.78 kg/m    Physical Exam Vitals reviewed.  Cardiovascular:     Rate and Rhythm: Normal rate and regular rhythm.     Heart sounds: Normal heart sounds.  Pulmonary:     Effort: Pulmonary effort is normal.     Breath sounds: Normal breath sounds.  Abdominal:     Tenderness: There is no abdominal tenderness.  Musculoskeletal:        General: Normal range of motion.  Skin:    General: Skin is warm.  Neurological:     Comments: Minimally confused  Psychiatric:     Comments: Consented with Brother [name] via phone     Imaging: VAS [place] UPPER EXTREMITY VENOUS DUPLEX  Result Date: 01/24/2023 UPPER VENOUS STUDY  Patient Name:  [name]  Date of Exam:   01/23/2023 Medical Rec #: [id number]        Accession #:    [id number] Date of Birth: July 23, 1946        Patient Gender: M Patient Age:   27 years Exam Location:  [facility] Procedure:      VAS [place] UPPER EXTREMITY VENOUS DUPLEX Referring Phys: Eddie Dibbles HOFFMAN --------------------------------------------------------------------------------  Indications: Edema Risk Factors: None identified. Limitations: Poor ultrasound/tissue interface, bandages and line. Comparison Study: No prior  studies. Performing Technologist: Oliver Hum RVT  Examination Guidelines: A complete evaluation includes B-mode imaging, spectral Doppler, color Doppler, and power Doppler as needed of all accessible portions of each vessel. Bilateral testing is considered an integral part of a complete examination. Limited examinations for reoccurring indications may be performed as noted.  Right Findings: +----------+------------+---------+-----------+----------+-------+ RIGHT     CompressiblePhasicitySpontaneousPropertiesSummary +----------+------------+---------+-----------+----------+-------+ IJV           Full       Yes       Yes                      +----------+------------+---------+-----------+----------+-------+ Subclavian    Full       Yes       Yes                      +----------+------------+---------+-----------+----------+-------+ Axillary      Full       Yes       Yes                      +----------+------------+---------+-----------+----------+-------+ Brachial      Full       Yes       Yes                      +----------+------------+---------+-----------+----------+-------+  Radial        Full                                          +----------+------------+---------+-----------+----------+-------+ Ulnar         Full                                          +----------+------------+---------+-----------+----------+-------+ Cephalic      None                                   Acute  +----------+------------+---------+-----------+----------+-------+ Basilic       Full                                          +----------+------------+---------+-----------+----------+-------+ Thrombus detected in the cephalic vein is noted to only be in the mid forearm.  Left Findings: +----------+------------+---------+-----------+----------+-------+ LEFT      CompressiblePhasicitySpontaneousPropertiesSummary  +----------+------------+---------+-----------+----------+-------+ Subclavian    Full       Yes       Yes                      +----------+------------+---------+-----------+----------+-------+  Summary:  Right: No evidence of deep vein thrombosis in the upper extremity. Findings consistent with acute superficial vein thrombosis involving the right cephalic vein.  Left: No evidence of thrombosis in the subclavian.  *See table(s) above for measurements and observations.  Diagnosing physician: Jamelle Haring Electronically signed by Jamelle Haring on 01/24/2023 at 7:30:04 AM.    Final    IR Removal Tun Cv Cath W/O FL  Result Date: 01/23/2023 INDICATION: 77 year old with gram negative bacteremia in the setting of tunneled dialysis catheter placement as well as sacral wound. EXAM: REMOVAL TUNNELED CENTRAL VENOUS CATHETER MEDICATIONS: 5 mL 1% lidocaine ANESTHESIA/SEDATION: None FLUOROSCOPY: None COMPLICATIONS: None immediate. PROCEDURE: Informed written consent was obtained from the patient after a thorough discussion of the procedural risks, benefits and alternatives. All questions were addressed. Maximal Sterile Barrier Technique was utilized including caps, mask, sterile gowns, sterile gloves, sterile drape, hand hygiene and skin antiseptic. A timeout was performed prior to the initiation of the procedure. The patient's right chest and catheter was prepped and draped in a normal sterile fashion. Heparin was removed from both ports of catheter. 1% lidocaine was used for local anesthesia. Using gentle blunt dissection the cuff of the catheter was exposed and the catheter was removed in it's entirety. Pressure was held till hemostasis was obtained. A sterile dressing was applied. The patient tolerated the procedure well with no immediate complications. IMPRESSION: Successful catheter removal as described above. Read by: Brynda Greathouse PA-C Electronically Signed   By: Ruthann Cancer M.D.   On: 01/23/2023 20:29   CT  CHEST ABDOMEN PELVIS WO CONTRAST  Result Date: 01/23/2023 CLINICAL DATA:  Recent hospitalization for cholecystitis and sacral ulcer, now with sepsis EXAM: CT CHEST, ABDOMEN AND PELVIS WITHOUT CONTRAST TECHNIQUE: Multidetector CT imaging of the chest, abdomen and pelvis was performed following the standard protocol without IV contrast. RADIATION DOSE REDUCTION: This exam was performed according to the departmental dose-optimization program which  includes automated exposure control, adjustment of the mA and/or kV according to patient size and/or use of iterative reconstruction technique. COMPARISON:  CT abdomen/pelvis dated 12/26/2022 FINDINGS: CT CHEST FINDINGS Cardiovascular: The heart is normal in size. No pericardial effusion. No evidence of thoracic aortic aneurysm. Atherosclerotic calcifications of the aortic arch. Severe three-vessel coronary atherosclerosis. Right IJ venous catheter terminates in the upper right atrium. Mediastinum/Nodes: No suspicious mediastinal lymphadenopathy. Visualized thyroid is unremarkable. Lungs/Pleura: Mild bilateral lower lobe scarring/atelectasis. No focal consolidation. Evaluation lung parenchyma is constrained by respiratory motion. Within that constraint, there are no suspicious pulmonary nodules. No pleural effusion or pneumothorax. Musculoskeletal: Healing right posterior 11th rib fracture (series 3/image 66). Moderate degenerative changes of the mid/lower thoracic spine. Thoracic spine stimulator. CT ABDOMEN PELVIS FINDINGS Hepatobiliary: Scattered small hepatic cysts, unchanged. Status post cholecystectomy. No intrahepatic or extrahepatic ductal dilatation. Pancreas: Within normal limits. Spleen: Within normal limits. Adrenals/Urinary Tract: Adrenal glands are within normal limits. Bilateral renal cysts, measuring up to 2.5 cm in the anterior interpolar right kidney (series 3/image 84), measuring simple fluid density, benign (Bosniak I). No follow-up is recommended. No  renal calculi or hydronephrosis. Bladder is mildly thick-walled although underdistended. Stomach/Bowel: Stomach is within normal limits. No evidence of bowel obstruction. Normal appendix (series 3/image 83). Mild eccentric rectal wall thickening (series 3/image 117), but new from the prior, favoring infectious/inflammatory colitis. Vascular/Lymphatic: No evidence of abdominal aortic aneurysm. Atherosclerotic calcifications of the abdominal aorta and branch vessels. No suspicious abdominopelvic lymphadenopathy. Reproductive: Prostate is unremarkable. Other: No abdominopelvic ascites. Musculoskeletal: Status post PLIF at L2-5. Degenerative changes of the lumbar spine. Large left paramidline sacral decubitus ulcer (series 3/image 117), progressive from the prior, with associated coccygeal osteomyelitis (series 3/image 120). IMPRESSION: Large left sacral decubitus ulcer, progressive from the prior, with associated coccygeal osteomyelitis. Mild eccentric rectal wall thickening, new from the prior, favoring infectious/inflammatory colitis. Healing right posterior 11th rib fracture. Otherwise, no acute cardiopulmonary abnormality. Additional ancillary findings as above. Electronically Signed   By: Julian Hy M.D.   On: 01/23/2023 03:58   CT Head Wo Contrast  Result Date: 01/23/2023 CLINICAL DATA:  Recent hospitalization for cholecystitis and sacral ulcer, now with sepsis EXAM: CT HEAD WITHOUT CONTRAST TECHNIQUE: Contiguous axial images were obtained from the base of the skull through the vertex without intravenous contrast. RADIATION DOSE REDUCTION: This exam was performed according to the departmental dose-optimization program which includes automated exposure control, adjustment of the mA and/or kV according to patient size and/or use of iterative reconstruction technique. COMPARISON:  11/04/2022 FINDINGS: Brain: No evidence of acute infarction, hemorrhage, hydrocephalus, extra-axial collection or mass  lesion/mass effect. Global cortical atrophy. Subcortical white matter and periventricular small vessel ischemic changes. Vascular: Intracranial atherosclerosis. Skull: Normal. Negative for fracture or focal lesion. Sinuses/Orbits: The visualized paranasal sinuses are essentially clear. The mastoid air cells are unopacified. Other: None. IMPRESSION: No evidence of acute intracranial abnormality. Atrophy with small vessel ischemic changes. Electronically Signed   By: Julian Hy M.D.   On: 01/23/2023 03:48   DG Chest Port 1 View  Result Date: 01/22/2023 CLINICAL DATA:  Altered mental status EXAM: PORTABLE CHEST 1 VIEW COMPARISON:  Chest x-ray dated November 04, 2022 FINDINGS: Cardiac and mediastinal contours are unchanged. Right central venous catheter is unchanged in position. Right costophrenic angle is excluded from field of view. Visualized lungs are clear. The visualized skeletal structures are unremarkable. IMPRESSION: No active disease. Electronically Signed   By: Yetta Glassman M.D.   On: 01/22/2023 18:01   DG  Abd Portable 1V  Result Date: 12/27/2022 CLINICAL DATA:  Encounter for feeding tube placement EXAM: PORTABLE ABDOMEN - 1 VIEW COMPARISON:  Abdominal radiographs 08/07/2022. FINDINGS: Feeding tube courses below the diaphragm with the tip in the expected region of the stomach. Nonobstructive bowel gas pattern. Spinal stimulator in place. Lumbar fixation. IMPRESSION: Feeding tube courses below the diaphragm with the tip in the expected region of the stomach Electronically Signed   By: Margaretha Sheffield M.D.   On: 12/27/2022 15:58    Labs:  CBC: Recent Labs    01/22/23 1738 01/22/23 1746 01/23/23 0448 01/24/23 0602 01/24/23 1920 01/25/23 1243  WBC 19.5*  --  21.3* 17.2*  --  17.2*  HGB 9.4*   < > 8.5* 7.1* 8.3* 9.9*  HCT 30.9*   < > 27.2* 22.7* 26.0* 30.1*  PLT 488*  --  392 377  --  381   < > = values in this interval not displayed.    COAGS: Recent Labs     05/10/22 0340 08/19/22 0732 08/20/22 0637 09/13/22 1029 01/22/23 1838  INR  --  1.6* 1.5* 1.1 1.3*  APTT 30 38*  --   --  24    BMP: Recent Labs    01/23/23 0448 01/24/23 0602 01/25/23 1243 01/26/23 0104  NA 134* 136 133* 135  K 4.1 3.5 3.8 3.8  CL 97* 100 98 101  CO2 25 23 21* 23  GLUCOSE 112* 89 119* 92  BUN 18 34* 51* 56*  CALCIUM 9.3 9.1 9.3 9.0  CREATININE 2.31* 3.06* 3.73* 3.89*  GFRNONAA 28* 20* 16* 15*    LIVER FUNCTION TESTS: Recent Labs    11/04/22 0907 11/28/22 2237 12/20/22 0424 12/21/22 0145 12/22/22 0122 12/23/22 0140 01/22/23 1738 01/23/23 0448 01/24/23 0602 01/25/23 1243 01/26/23 0104  BILITOT 0.6  --  0.4  --  <0.1*  --  0.8  --   --   --   --   AST 23  --  42*  --  21  --  45*  --   --   --   --   ALT 13  --  17  --  <5  --  16  --   --   --   --   ALKPHOS 107  --  52  --  47  --  70  --   --   --   --   PROT 6.4*  --  7.0  --  6.5  --  7.7  --   --   --   --   ALBUMIN 2.4*   < > 2.3*   < > 2.2*   < > 2.2* 1.8* 1.6* 1.7* 1.6*   < > = values in this interval not displayed.    TUMOR MARKERS: No results for input(s): "AFPTM", "CEA", "CA199", "CHROMGRNA" in the last 8760 hours.  Assessment and Plan:  Sepsis Ecoli bacteremia Large sacral decubitus ulcer Tunneled HD cath was removed 2/1 in IR- [place] All BCs are neg to date Possible replacement in IR 01/27/23 Afeb; but wbc 17.2 Risks and benefits discussed with the patient's brother [name] via phone including, but not limited to bleeding, infection, vascular injury, pneumothorax which may require chest tube placement, air embolism or even death  All questions were answered, [name] is agreeable to proceed. Consent signed and in chart.  Thank you for this interesting consult.  I greatly enjoyed meeting [name] and look forward to participating in  their care.  A copy of this report was sent to the requesting provider on this date.  Electronically Signed: Lavonia Drafts,  PA-C 01/26/2023, 11:09 AM   I spent a total of 20 Minutes    in face to face in clinical consultation, greater than 50% of which was counseling/coordinating care for tunneled vs non tunneled dialysis catheter placement

## 2023-01-26 NOTE — Progress Notes (Signed)
[facility] PROGRESS NOTE  [name] (DOB: 04/09/1946) TIR:[id number] PCP: Redmond School, MD  Brief Narrative: [name] is a 77 y.o. male with a history of recent failure to thrive, ESRD (HD MWF), multivessel CAD, T2DM, HFrEF, PAFprolonged admission, bedbound with large tunneling sacral decubitus ulcer who was discharged to SNF 1/30 only to return 1/31 with septic shock due to E. coli bacteremia, Morganella also growing on cultures, coccygeal osteomyelitis causing metabolic encephalopathy. Antibiotics, guided by ID, have been given with improvement, liberation from pressors. HD catheter removed pending blood culture clearance. If repeat cultures negative on 2/5, will replace catheter in IR and restart routine HD.  Subjective: Only complaints is pain in bottom. Improved with tylenol, doesn't want anything stronger right now.   Objective: BP 129/71 (BP Location: Left Arm)   Pulse 88   Temp 97.6 F (36.4 C) (Oral)   Resp 16   Ht '6\' 3"'$  (1.905 m)   Wt 86.3 kg   SpO2 99%   BMI 23.78 kg/m   Gen: Frail, no distress Pulm: Clear, nonlabored  CV: RRR, no edema GI: Soft, NT, ND, +BS  Neuro: Cooperative, very weak diffusely. No new focal deficits. Ext: Warm, no deformities. Skin: No new rashes, lesions or ulcers on visualized skin   Assessment & Plan: Septic shock due to GNR (E. coli, Morganella) bacteremia due to coccygeal osteomyelitis: Liberated from pressors.  - Continue midodrine - Surgery consulted, no procedure recommended.  - Continue cefepime, flagyl pending clearance of repeat blood cultures (2/2), which remains NGTD, per ID  Sacral decubitus, POA:  - Continue offloading the wound, local wound care, optimizing nutrition, and discussing goals of care.  - Palliative following - Continue pain control as ordered per pt preference.  ESRD:  - Resume regular HD MWF on 2/5 if able to replace HD catheter once blood cultures confirmed to have cleared x48 hours.  Appreciate nephrology assistance.  Acute metabolic encephalopathy: Due to septic shock, has improved considerably.  - Delirium precautions  Acute blood loss anemia on chronic macrocytic anemia and AOESRD:  - s/p 1u PRBCs 2/2 with improvement, no gross brisk bleeding noted, though likely having blood loss from wound. Looks on T&S like pt has Ab's. Will minimize transfusions as able. - ESA per nephrology - Supplementing folic acid in addition to broad multivitamin given low level (5.7)  Moderate protein calorie malnutrition:  - Encourage po intake, supp protein as able.   CAD: No anginal complaints. - Holding isosorbide with soft BP, ok to restart now - Continue ASA, statin  T2DM:  - SSI.   Chronic HFrEF: Last echo 12/12/2022 with LVEF 50-55%, no RWMA. Appears roughly euvolemic currently.  - Manage volume with HD.   Goals of care counseling/discussion:  - Appreciate palliative care assistance in this difficult situation.   Right cephalic (superficial) vein thrombosis: No DVT.  - Supportive care.   Patrecia Pour, MD [facility] [email or web address] 01/26/2023, 12:30 PM

## 2023-01-27 ENCOUNTER — Inpatient Hospital Stay (HOSPITAL_COMMUNITY): Payer: Medicare Other

## 2023-01-27 DIAGNOSIS — B9620 Unspecified Escherichia coli [E. coli] as the cause of diseases classified elsewhere: Secondary | ICD-10-CM | POA: Diagnosis not present

## 2023-01-27 DIAGNOSIS — N186 End stage renal disease: Secondary | ICD-10-CM | POA: Diagnosis not present

## 2023-01-27 DIAGNOSIS — R7881 Bacteremia: Secondary | ICD-10-CM | POA: Diagnosis not present

## 2023-01-27 DIAGNOSIS — A419 Sepsis, unspecified organism: Secondary | ICD-10-CM | POA: Diagnosis not present

## 2023-01-27 DIAGNOSIS — M4628 Osteomyelitis of vertebra, sacral and sacrococcygeal region: Secondary | ICD-10-CM | POA: Diagnosis not present

## 2023-01-27 DIAGNOSIS — T827XXA Infection and inflammatory reaction due to other cardiac and vascular devices, implants and grafts, initial encounter: Secondary | ICD-10-CM

## 2023-01-27 HISTORY — PX: IR FLUORO GUIDE CV LINE RIGHT: IMG2283

## 2023-01-27 HISTORY — PX: IR US GUIDE VASC ACCESS RIGHT: IMG2390

## 2023-01-27 LAB — CBC
HCT: 29.8 % — ABNORMAL LOW (ref 39.0–52.0)
Hemoglobin: 9.2 g/dL — ABNORMAL LOW (ref 13.0–17.0)
MCH: 31.2 pg (ref 26.0–34.0)
MCHC: 30.9 g/dL (ref 30.0–36.0)
MCV: 101 fL — ABNORMAL HIGH (ref 80.0–100.0)
Platelets: 411 10*3/uL — ABNORMAL HIGH (ref 150–400)
RBC: 2.95 MIL/uL — ABNORMAL LOW (ref 4.22–5.81)
RDW: 18.8 % — ABNORMAL HIGH (ref 11.5–15.5)
WBC: 18.1 10*3/uL — ABNORMAL HIGH (ref 4.0–10.5)
nRBC: 0 % (ref 0.0–0.2)

## 2023-01-27 LAB — RENAL FUNCTION PANEL
Albumin: 1.7 g/dL — ABNORMAL LOW (ref 3.5–5.0)
Anion gap: 12 (ref 5–15)
BUN: 64 mg/dL — ABNORMAL HIGH (ref 8–23)
CO2: 23 mmol/L (ref 22–32)
Calcium: 9.3 mg/dL (ref 8.9–10.3)
Chloride: 102 mmol/L (ref 98–111)
Creatinine, Ser: 4.26 mg/dL — ABNORMAL HIGH (ref 0.61–1.24)
GFR, Estimated: 14 mL/min — ABNORMAL LOW (ref >60)
Glucose, Bld: 81 mg/dL (ref 70–99)
Phosphorus: 4.5 mg/dL (ref 2.5–4.6)
Potassium: 5.7 mmol/L — ABNORMAL HIGH (ref 3.5–5.1)
Sodium: 137 mmol/L (ref 135–145)

## 2023-01-27 LAB — POTASSIUM: Potassium: 3.9 mmol/L (ref 3.5–5.1)

## 2023-01-27 MED ORDER — CEFAZOLIN SODIUM-DEXTROSE 2-4 GM/100ML-% IV SOLN
INTRAVENOUS | Status: AC
  Administered 2023-01-27: 2 g
  Filled 2023-01-27: qty 100

## 2023-01-27 MED ORDER — HEPARIN SODIUM (PORCINE) 1000 UNIT/ML DIALYSIS: 1000.0000 [IU] | INTRAMUSCULAR | Status: DC | PRN

## 2023-01-27 MED ORDER — MIDAZOLAM HCL 2 MG/2ML IJ SOLN
INTRAMUSCULAR | Status: AC | PRN
  Administered 2023-01-27: 1 mg via INTRAVENOUS
  Administered 2023-01-27 (×2): .5 mg via INTRAVENOUS

## 2023-01-27 MED ORDER — MIDAZOLAM HCL 2 MG/2ML IJ SOLN
INTRAMUSCULAR | Status: AC
  Filled 2023-01-27: qty 2

## 2023-01-27 MED ORDER — METRONIDAZOLE 500 MG/100ML IV SOLN
500.0000 mg | Freq: Once | INTRAVENOUS | Status: AC
  Administered 2023-01-27: 500 mg via INTRAVENOUS
  Filled 2023-01-27: qty 100

## 2023-01-27 MED ORDER — FENTANYL CITRATE (PF) 100 MCG/2ML IJ SOLN
INTRAMUSCULAR | Status: AC | PRN
  Administered 2023-01-27 (×4): 25 ug via INTRAVENOUS

## 2023-01-27 MED ORDER — LIDOCAINE-EPINEPHRINE 1 %-1:100000 IJ SOLN
INTRAMUSCULAR | Status: AC
  Filled 2023-01-27: qty 1

## 2023-01-27 MED ORDER — HEPARIN SODIUM (PORCINE) 1000 UNIT/ML IJ SOLN
INTRAMUSCULAR | Status: AC
  Filled 2023-01-27: qty 10

## 2023-01-27 MED ORDER — FENTANYL CITRATE (PF) 100 MCG/2ML IJ SOLN
INTRAMUSCULAR | Status: AC
  Filled 2023-01-27: qty 2

## 2023-01-27 NOTE — Care Management Important Message (Signed)
Important Message  Patient Details  Name: [name] MRN: [id number] Date of Birth: [birth year]   Medicare Important Message Given:        Shelda Altes 01/27/2023, 10:32 AM

## 2023-01-27 NOTE — Progress Notes (Signed)
[facility] NEPHROLOGY PROGRESS NOTE  Summary Pt is a 77 y.o. yo male with ESRD on HD, CHF, DM, A-fib, CAD, mostly bedbound with physical deconditioning known large sacral decubitus ulcer, recently discharged from hospital on 1/30 now presented back with septic shock.   Subjective: Seen and examined at the bedside.  He was transferred out of ICU to the floor.    Physical Exam: General: Elderly looking frail male lying on bed in ICU. Heart:RRR, s1s2 nl Lungs:clear b/l, no crackle Abdomen:soft, Non-tender, non-distended Extremities:No edema Dialysis Access: HD catheter was removed, no access at the moment.   Dialysis Orders: [facility] MWF 4h   400/800   83kg  3K/2.25Ca bath  R TDC   Heparin 4000 units - Full 4hr HD 1/31, was below new EDW - left 78.6kg - Mircera 225 mcg IV q 2 weeks (due 2/2, last given Aranesp 290mg on 1/26) - no VDRA  Assessment/ Plan # Septic shock /  E. coli bacteremia / coccygeal osteomyelitis / sacral decub - off of pressors, on Cefepime.  Seen by general surgery recommended no acute surgical need at this time.  HD catheter was removed on 2/1 for line holiday.  Blood culture was repeated on 2/2 and is negative to date; [facility]   # ESRD MWF: He had last HD here on 1/31. [facility] today[facility] or at the latest in the morning.    # Hypotension/volume - required pressors which is off now.  Midodrine pre-HD.  # Secondary hyperparathyroidism: Calcium and phosphorus level acceptable.  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  # Goals of care: Patient with multiple admission, stage IV sacral decubiti, physical deconditioning.  Palliative care team met w/ patient and some family yesterday. Appreciate assistance.   RKelly Splinter MD 01/27/2023, 2:27 PM  Recent Labs  Lab 01/25/23 1243 01/26/23 0104 01/27/23 0105 01/27/23 0448  HGB  9.9*  --   --  9.2*  ALBUMIN 1.7* 1.6* 1.7*  --   CALCIUM 9.3 9.0 9.3  --   PHOS 3.6 3.6 4.5  --   CREATININE 3.73* 3.89* 4.26*  --   K 3.8 3.8 5.7* 3.9    Inpatient medications:  (feeding supplement) PROSource Plus  30 mL Oral BID BM   acetaminophen  1,000 mg Oral Q8H   aspirin EC  81 mg Oral Daily   atorvastatin  40 mg Oral Daily   Chlorhexidine Gluconate Cloth  6 each Topical Daily   Chlorhexidine Gluconate Cloth  6 each Topical Q0600   darbepoetin (ARANESP) injection - DIALYSIS  100 mcg Subcutaneous Q Fri-1800   feeding supplement  237 mL Oral TID BM   folic acid  1 mg Oral Daily   gabapentin  100 mg Oral TID   heparin  5,000 Units Subcutaneous Q8H   isosorbide dinitrate  5 mg Oral BID   leptospermum manuka honey  1 Application Topical Daily   metroNIDAZOLE  500 mg Oral Q12H   midodrine  10 mg Oral Q M,W,F-HD   midodrine  10 mg Oral Q Sat-HD   multivitamin  1 tablet Oral QHS   pantoprazole (PROTONIX) IV  40 mg Intravenous Daily    sodium chloride Stopped (01/24/23 2146)   ceFEPime (MAXIPIME) IV Stopped (01/26/23 2227)   sodium chloride

## 2023-01-27 NOTE — Progress Notes (Signed)
Pt back from IR. Very sleepy. Moved pt onto air mattress pt tolerated well. Sacral dressing changed     01/27/23 1721  Assess: MEWS Score  Temp 97.6 F (36.4 C)  BP 122/68  MAP (mmHg) 83  Pulse Rate 97  ECG Heart Rate 97  Resp 17  Level of Consciousness Alert  SpO2 100 %  O2 Device Room Air  O2 Flow Rate (L/min) 0 L/min  Assess: MEWS Score  MEWS Temp 0  MEWS Systolic 0  MEWS Pulse 0  MEWS RR 0  MEWS LOC 0  MEWS Score 0  MEWS Score Color Green  Treat  Pain Score Asleep  Assess: SIRS CRITERIA  SIRS Temperature  0  SIRS Pulse 1  SIRS Respirations  0  SIRS WBC 0  SIRS Score Sum  1

## 2023-01-27 NOTE — Progress Notes (Incomplete)
PHARMACY CONSULT NOTE FOR:  OUTPATIENT  PARENTERAL ANTIBIOTIC THERAPY (OPAT)  Indication:  Regimen:  End date: 02/06/23  IV antibiotic discharge orders are pended. To discharging provider:  please sign these orders via discharge navigator,  Select New Orders & click on the button choice - Manage This Unsigned Work.     Thank you for allowing pharmacy to be a part of this patient's care.  Droke Radar 01/27/2023, 10:25 AM

## 2023-01-27 NOTE — Progress Notes (Signed)
Patient ID: RAMAL ECKHARDT, male   DOB: Jul 14, 1946, 77 y.o.   MRN: [id number]    Progress Note from the Palliative Medicine Team at [facility]   Patient Name: [name]        Date: 01/27/2023 DOB: 1946/05/31  Age: 77 y.o. MRN#: [id number] Attending Physician: Patrecia Pour, MD Primary Care Physician: Redmond School, MD Admit Date: 01/22/2023   Medical records reviewed   77 y.o. male with a history of recent failure to thrive, ESRD (HD MWF), multivessel CAD, T2DM, HFrEF, PAFprolonged admission, bedbound with large tunneling sacral decubitus ulcer who was discharged to SNF 1/30 only to return 1/31 with septic shock due to E. coli bacteremia, Morganella also growing on cultures, coccygeal osteomyelitis causing metabolic encephalopathy. Antibiotics, guided by ID, have been given with improvement, liberation from pressors. HD catheter removed pending blood culture clearance. If repeat cultures negative on 2/5, will replace catheter in IR and restart routine HD.   Plan to replace HD cath today .  Multiple SNF stays over the past several months, continued physical, functional and cognitive decline over the past six months     This NP assessed patient at the bedside as a follow up for palliative medicine needs and emotional support.  Created space and opportunity for [name] to explore his thoughts and feelings regarding current medical situation. I outlined the seriousness of his current medical situation specific to; end-stage renal disease/dialysis dependent, coronary artery disease, CHF, overall failure to thrive secondary to bedbound status and tunneling sacral decubitus ulcer.  He clearly expresses to me that he "does not want to "talk about it"  I spoke to his [name] by telephone.  He has a clear understanding of his brother's current medical situation and the impact of the patient's decisions regarding his healthcare and treatment plan.  He tells me that his  brother has continually refused conversation regarding his healthcare and refuses to utilize his personal funds to secure the personal care/nursing care that he needs at this time in his life.  His brother again tells me he understands but that he has no control over his brother's decisions.   Plan of Care: -Full Code  -Patient is open to all offered and available medical interventions to prolong life    Education offered today regarding  the importance of continued conversation with his family and the medical providers regarding overall plan of care and treatment options,  ensuring decisions are within the context of the patients values and GOCs.  Questions and concerns addressed    50 minutes   Wadie Lessen NP  Palliative Medicine Team Team Phone # [phone] Pager [phone]

## 2023-01-27 NOTE — Progress Notes (Signed)
[facility] PROGRESS NOTE  [name] (DOB: 16-Mar-1946) HQI:[id number] PCP: Redmond School, MD  Brief Narrative: [name] is a 77 y.o. male with a history of recent failure to thrive, ESRD (HD MWF), multivessel CAD, T2DM, HFrEF, PAFprolonged admission, bedbound with large tunneling sacral decubitus ulcer who was discharged to SNF 1/30 only to return 1/31 with septic shock due to E. coli bacteremia, Morganella also growing on cultures, coccygeal osteomyelitis causing metabolic encephalopathy. Antibiotics, guided by ID, have been given with improvement, liberation from pressors. HD catheter removed pending blood culture clearance. If repeat cultures negative on 2/5, will replace catheter in IR and restart routine HD.  Subjective: Does not have any complaint today. Does not seem interested in speaking with me. When I ask how his pain is he says "put it on channel 2." So I put it on channel 2, re-ask the question with no verbal response. Ask him how his day was yesterday, did he speak with anyone? He says "put it on channel 12" and watches the TV. I eventually do get him to say that he's fine and there's nothing I can do for him today. He's ok getting re-stuck for confirmatory potassium level.   Objective: BP 114/77 (BP Location: Left Arm)   Pulse 100   Temp (!) 97.5 F (36.4 C) (Oral)   Resp 19   Ht '6\' 3"'$  (1.905 m)   Wt 86.3 kg   SpO2 100%   BMI 23.78 kg/m   Gen: No distress Pulm: Clear, nonlabored  CV: RRR, trace pitting edema in LE's GI: Soft, NT, ND, +BS  Neuro: Alert, oriented, not cooperative with exam.  Ext: Warm, severely diminished muscle bulk. Skin: No new rashes, lesions or ulcers on visualized skin   Assessment & Plan: Septic shock due to GNR (E. coli, Morganella) bacteremia due to coccygeal osteomyelitis: Liberated from pressors. Note continued significant leukocytosis and now reactive thrombocytosis. No fevers since 1/31.  - Continue midodrine - Surgery  consulted, no procedure recommended.  - Continue cefepime, flagyl pending clearance of repeat blood cultures (2/2), remaining NGTD at this time, per ID  Sacral decubitus, POA:  - Continue offloading the wound, local wound care, optimizing nutrition, and discussing goals of care.  - Palliative following - Continue pain control as ordered per pt preference.  ESRD:  - Resume regular HD MWF on 2/5 if able to replace HD catheter once blood cultures confirmed to have cleared x48 hours. Appreciate nephrology assistance. - Concerns whether patient would be able to tolerate dialysis/sitting as an outpatient. Will have to check this.  Hyperkalemia:  - Suspected to be spurious due to lab error. Repeat this AM is normal, more consistent with other values. Would still prefer to pursue routine HD today.   Acute metabolic encephalopathy: Due to septic shock, has improved considerably.  - Delirium precautions  Acute blood loss anemia on chronic macrocytic anemia and AOESRD:  - s/p 1u PRBCs 2/2 with improvement, no gross brisk bleeding noted, though likely having blood loss from wound. Looks on T&S like pt has Ab's. Will minimize transfusions as able. - ESA per nephrology - Supplementing folic acid in addition to broad multivitamin given low level (5.7)  Moderate protein calorie malnutrition:  - Encourage po intake, supp protein as able.   CAD: No anginal complaints. - Holding isosorbide with soft BP, ok to restart now - Continue ASA, statin  T2DM:  - SSI.   Chronic HFrEF: Last echo 12/12/2022 with LVEF 50-55%, no RWMA. Appears roughly  euvolemic currently.  - Manage volume with HD.   Goals of care counseling/discussion:  - Appreciate palliative care assistance in this difficult situation.   Right cephalic (superficial) vein thrombosis: No DVT.  - Supportive care.   Patrecia Pour, MD [facility] [email or web address] 01/27/2023, 8:55 AM

## 2023-01-27 NOTE — Progress Notes (Signed)
Subjective:  No new complaints thought c/o butt hurting   Antibiotics:  Anti-infectives (From admission, onward)    Start     Dose/Rate Route Frequency Ordered Stop   01/27/23 1000  metroNIDAZOLE (FLAGYL) IVPB 500 mg        500 mg 100 mL/hr over 60 Minutes Intravenous  Once 01/27/23 0852 01/27/23 1210   01/27/23 0800  ceFAZolin (ANCEF) IVPB 2g/100 mL premix        2 g 200 mL/hr over 30 Minutes Intravenous To Radiology 01/26/23 1123 01/27/23 0914   01/24/23 2200  metroNIDAZOLE (FLAGYL) tablet 500 mg        500 mg Oral Every 12 hours 01/24/23 1627     01/24/23 2000  ceFEPIme (MAXIPIME) 1 g in sodium chloride 0.9 % 100 mL IVPB        1 g 200 mL/hr over 30 Minutes Intravenous Every 24 hours 01/24/23 1627     01/24/23 1800  vancomycin (VANCOCIN) IVPB 1000 mg/200 mL premix  Status:  Discontinued        1,000 mg 200 mL/hr over 60 Minutes Intravenous Every M-W-F (Hemodialysis) 01/22/23 1820 01/23/23 1057   01/23/23 2000  ceFEPIme (MAXIPIME) 2 g in sodium chloride 0.9 % 100 mL IVPB  Status:  Discontinued        2 g 200 mL/hr over 30 Minutes Intravenous Every 24 hours 01/22/23 1820 01/23/23 0102   01/23/23 0600  piperacillin-tazobactam (ZOSYN) IVPB 2.25 g  Status:  Discontinued        2.25 g 100 mL/hr over 30 Minutes Intravenous Every 8 hours 01/23/23 0119 01/24/23 1627   01/22/23 1815  ceFEPIme (MAXIPIME) 2 g in sodium chloride 0.9 % 100 mL IVPB        2 g 200 mL/hr over 30 Minutes Intravenous  Once 01/22/23 1804 01/22/23 1944   01/22/23 1815  metroNIDAZOLE (FLAGYL) IVPB 500 mg        500 mg 100 mL/hr over 60 Minutes Intravenous  Once 01/22/23 1804 01/22/23 2005   01/22/23 1815  vancomycin (VANCOCIN) IVPB 1000 mg/200 mL premix        1,000 mg 200 mL/hr over 60 Minutes Intravenous  Once 01/22/23 1804 01/22/23 2107       Medications: Scheduled Meds:  (feeding supplement) PROSource Plus  30 mL Oral BID BM   acetaminophen  1,000 mg Oral Q8H   aspirin EC  81 mg Oral  Daily   atorvastatin  40 mg Oral Daily   Chlorhexidine Gluconate Cloth  6 each Topical Daily   Chlorhexidine Gluconate Cloth  6 each Topical Q0600   darbepoetin (ARANESP) injection - DIALYSIS  100 mcg Subcutaneous Q Fri-1800   feeding supplement  237 mL Oral TID BM   folic acid  1 mg Oral Daily   gabapentin  100 mg Oral TID   heparin  5,000 Units Subcutaneous Q8H   isosorbide dinitrate  5 mg Oral BID   leptospermum manuka honey  1 Application Topical Daily   metroNIDAZOLE  500 mg Oral Q12H   midodrine  10 mg Oral Q M,W,F-HD   midodrine  10 mg Oral Q Sat-HD   multivitamin  1 tablet Oral QHS   pantoprazole (PROTONIX) IV  40 mg Intravenous Daily   Continuous Infusions:  sodium chloride Stopped (01/24/23 2146)   ceFEPime (MAXIPIME) IV Stopped (01/26/23 2227)   PRN Meds:.sodium chloride    Objective: Weight change:   Intake/Output Summary (Last 24 hours) at 01/27/2023 1314  Last data filed at 01/27/2023 1165 Gross per 24 hour  Intake 100 ml  Output 450 ml  Net -350 ml    Blood pressure 114/77, pulse 100, temperature (!) 97.5 F (36.4 C), temperature source Oral, resp. rate 19, height '6\' 3"'$  (1.905 m), weight 86.3 kg, SpO2 100 %. Temp:  [97.5 F (36.4 C)-98.3 F (36.8 C)] 97.5 F (36.4 C) (02/05 0847) Pulse Rate:  [100-101] 100 (02/05 0847) Resp:  [19-38] 19 (02/05 0847) BP: (114-142)/(77) 114/77 (02/05 0847) SpO2:  [100 %] 100 % (02/05 0847)  Physical Exam: Physical Exam Vitals reviewed.  Constitutional:      Appearance: He is obese.  HENT:     Head: Normocephalic and atraumatic.  Cardiovascular:     Rate and Rhythm: Normal rate and regular rhythm.  Pulmonary:     Effort: Pulmonary effort is normal.  Neurological:     Mental Status: He is alert. He is confused.     Wound not examined  CBC:    BMET Recent Labs    01/26/23 0104 01/27/23 0105 01/27/23 0448  NA 135 137  --   K 3.8 5.7* 3.9  CL 101 102  --   CO2 23 23  --   GLUCOSE 92 81  --   BUN 56*  64*  --   CREATININE 3.89* 4.26*  --   CALCIUM 9.0 9.3  --       Liver Panel  Recent Labs    01/26/23 0104 01/27/23 0105  ALBUMIN 1.6* 1.7*        Sedimentation Rate No results for input(s): "ESRSEDRATE" in the last 72 hours. C-Reactive Protein No results for input(s): "CRP" in the last 72 hours.  Micro Results: Recent Results (from the past 720 hour(s))  Resp panel by RT-PCR (RSV, Flu A&B, Covid) Anterior Nasal Swab     Status: None   Collection Time: 01/22/23  6:05 PM   Specimen: Anterior Nasal Swab  Result Value Ref Range Status   SARS Coronavirus 2 by RT PCR NEGATIVE NEGATIVE Final    Comment: (NOTE) SARS-CoV-2 target nucleic acids are NOT DETECTED.  The SARS-CoV-2 RNA is generally detectable in upper respiratory specimens during the acute phase of infection. The lowest concentration of SARS-CoV-2 viral copies this assay can detect is 138 copies/mL. A negative result does not preclude SARS-Cov-2 infection and should not be used as the sole basis for treatment or other patient management decisions. A negative result may occur with  improper specimen collection/handling, submission of specimen other than nasopharyngeal swab, presence of viral mutation(s) within the areas targeted by this assay, and inadequate number of viral copies(<138 copies/mL). A negative result must be combined with clinical observations, patient history, and epidemiological information. The expected result is Negative.  Fact Sheet for Patients:  EntrepreneurPulse.com.au  Fact Sheet for Healthcare Providers:  IncredibleEmployment.be  This test is no t yet approved or cleared by the [place] FDA and  has been authorized for detection and/or diagnosis of SARS-CoV-2 by FDA under an Emergency Use Authorization (EUA). This EUA will remain  in effect (meaning this test can be used) for the duration of the COVID-19 declaration under Section 564(b)(1) of  the Act, 21 U.S.C.section 360bbb-3(b)(1), unless the authorization is terminated  or revoked sooner.       Influenza A by PCR NEGATIVE NEGATIVE Final   Influenza B by PCR NEGATIVE NEGATIVE Final    Comment: (NOTE) The Xpert Xpress SARS-CoV-2/FLU/RSV plus assay is intended as an aid in the  diagnosis of influenza from Nasopharyngeal swab specimens and should not be used as a sole basis for treatment. Nasal washings and aspirates are unacceptable for Xpert Xpress SARS-CoV-2/FLU/RSV testing.  Fact Sheet for Patients: EntrepreneurPulse.com.au  Fact Sheet for Healthcare Providers: IncredibleEmployment.be  This test is not yet approved or cleared by the [place] FDA and has been authorized for detection and/or diagnosis of SARS-CoV-2 by FDA under an Emergency Use Authorization (EUA). This EUA will remain in effect (meaning this test can be used) for the duration of the COVID-19 declaration under Section 564(b)(1) of the Act, 21 U.S.C. section 360bbb-3(b)(1), unless the authorization is terminated or revoked.     Resp Syncytial Virus by PCR NEGATIVE NEGATIVE Final    Comment: (NOTE) Fact Sheet for Patients: EntrepreneurPulse.com.au  Fact Sheet for Healthcare Providers: IncredibleEmployment.be  This test is not yet approved or cleared by the [place] FDA and has been authorized for detection and/or diagnosis of SARS-CoV-2 by FDA under an Emergency Use Authorization (EUA). This EUA will remain in effect (meaning this test can be used) for the duration of the COVID-19 declaration under Section 564(b)(1) of the Act, 21 U.S.C. section 360bbb-3(b)(1), unless the authorization is terminated or revoked.  Performed at [facility], [address], [place], [place] [address]   Culture, blood (Routine x 2)     Status: Abnormal   Collection Time: 01/22/23  6:25 PM   Specimen: BLOOD RIGHT HAND  Result Value  Ref Range Status   Specimen Description   Final    BLOOD RIGHT HAND Performed at [facility], [address], [place], [place] [address]    Special Requests   Final    BOTTLES DRAWN AEROBIC ONLY Blood Culture adequate volume Performed at [address] [place], [place] [address]    Culture  Setup Time   Final    GRAM NEGATIVE RODS AEROBIC BOTTLE Gram Stain Report Called to,Read Back By and Verified With: BARBOUR,A '@0855'$  BY MATTHEWS, B 2.1.2024 CRITICAL RESULT CALLED TO, READ BACK BY AND VERIFIED WITH: PHARMD AUSTIN PAYTES [id number] 1458 BY JRS Performed at [facility], [place] [address] [place], [place] [address]    Culture ESCHERICHIA COLI (A)  Final   Report Status 01/25/2023 FINAL  Final   Organism ID, Bacteria ESCHERICHIA COLI  Final      Susceptibility   Escherichia coli - MIC*    AMPICILLIN >=32 RESISTANT Resistant     CEFAZOLIN <=4 SENSITIVE Sensitive     CEFEPIME <=0.12 SENSITIVE Sensitive     CEFTAZIDIME <=1 SENSITIVE Sensitive     CEFTRIAXONE <=0.25 SENSITIVE Sensitive     CIPROFLOXACIN <=0.25 SENSITIVE Sensitive     GENTAMICIN <=1 SENSITIVE Sensitive     IMIPENEM <=0.25 SENSITIVE Sensitive     TRIMETH/SULFA <=20 SENSITIVE Sensitive     AMPICILLIN/SULBACTAM >=32 RESISTANT Resistant     PIP/TAZO <=4 SENSITIVE Sensitive     * ESCHERICHIA COLI  Blood Culture ID Panel (Reflexed)     Status: Abnormal   Collection Time: 01/22/23  6:25 PM  Result Value Ref Range Status   Enterococcus faecalis NOT DETECTED NOT DETECTED Final   Enterococcus Faecium NOT DETECTED NOT DETECTED Final   Listeria monocytogenes NOT DETECTED NOT DETECTED Final   Staphylococcus species NOT DETECTED NOT DETECTED Final   Staphylococcus aureus (BCID) NOT DETECTED NOT DETECTED Final   Staphylococcus epidermidis NOT DETECTED NOT DETECTED Final   Staphylococcus lugdunensis NOT DETECTED NOT DETECTED Final   Streptococcus species NOT DETECTED NOT  DETECTED Final   Streptococcus agalactiae NOT  DETECTED NOT DETECTED Final   Streptococcus pneumoniae NOT DETECTED NOT DETECTED Final   Streptococcus pyogenes NOT DETECTED NOT DETECTED Final   A.calcoaceticus-baumannii NOT DETECTED NOT DETECTED Final   Bacteroides fragilis NOT DETECTED NOT DETECTED Final   Enterobacterales DETECTED (A) NOT DETECTED Final    Comment: Enterobacterales represent a large order of gram negative bacteria, not a single organism. CRITICAL RESULT CALLED TO, READ BACK BY AND VERIFIED WITH: PHARMD AUSTIN PAYTES [id number] 1458 BY JRS    Enterobacter cloacae complex NOT DETECTED NOT DETECTED Final   Escherichia coli DETECTED (A) NOT DETECTED Final    Comment: CRITICAL RESULT CALLED TO, READ BACK BY AND VERIFIED WITH: PAHRMD AUSTIN PAYTES [id number] 1458 BY JRS    Klebsiella aerogenes NOT DETECTED NOT DETECTED Final   Klebsiella oxytoca NOT DETECTED NOT DETECTED Final   Klebsiella pneumoniae NOT DETECTED NOT DETECTED Final   Proteus species NOT DETECTED NOT DETECTED Final   Salmonella species NOT DETECTED NOT DETECTED Final   Serratia marcescens NOT DETECTED NOT DETECTED Final   Haemophilus influenzae NOT DETECTED NOT DETECTED Final   Neisseria meningitidis NOT DETECTED NOT DETECTED Final   Pseudomonas aeruginosa NOT DETECTED NOT DETECTED Final   Stenotrophomonas maltophilia NOT DETECTED NOT DETECTED Final   Candida albicans NOT DETECTED NOT DETECTED Final   Candida auris NOT DETECTED NOT DETECTED Final   Candida glabrata NOT DETECTED NOT DETECTED Final   Candida krusei NOT DETECTED NOT DETECTED Final   Candida parapsilosis NOT DETECTED NOT DETECTED Final   Candida tropicalis NOT DETECTED NOT DETECTED Final   Cryptococcus neoformans/gattii NOT DETECTED NOT DETECTED Final   CTX-M ESBL NOT DETECTED NOT DETECTED Final   Carbapenem resistance IMP NOT DETECTED NOT DETECTED Final   Carbapenem resistance KPC NOT DETECTED NOT DETECTED Final   Carbapenem resistance NDM NOT DETECTED NOT DETECTED Final   Carbapenem resist  OXA 48 LIKE NOT DETECTED NOT DETECTED Final   Carbapenem resistance VIM NOT DETECTED NOT DETECTED Final    Comment: Performed at [facility], [address], [place], [place] [address]  Culture, blood (Routine x 2)     Status: Abnormal   Collection Time: 01/22/23  6:42 PM   Specimen: BLOOD RIGHT HAND  Result Value Ref Range Status   Specimen Description   Final    BLOOD RIGHT HAND Performed at [facility], [address] [place], [place] [address]    Special Requests   Final    BOTTLES DRAWN AEROBIC AND ANAEROBIC Blood Culture adequate volume Performed at [facility], [address] [place], [place] [address]    Culture  Setup Time   Final    GRAM NEGATIVE RODS IN BOTH AEROBIC AND ANAEROBIC BOTTLES CRITICAL RESULT CALLED TO, READ BACK BY AND VERIFIED WITH: PHARMD CAREN AMEND ON 01/23/23 @ 1803 BY DRT    Culture MORGANELLA MORGANII (A)  Final   Report Status 01/25/2023 FINAL  Final   Organism ID, Bacteria MORGANELLA MORGANII  Final      Susceptibility   Morganella morganii - MIC*    AMPICILLIN >=32 RESISTANT Resistant     CEFAZOLIN >=64 RESISTANT Resistant     CEFTAZIDIME <=1 SENSITIVE Sensitive     CIPROFLOXACIN <=0.25 SENSITIVE Sensitive     GENTAMICIN <=1 SENSITIVE Sensitive     IMIPENEM 2 SENSITIVE Sensitive     TRIMETH/SULFA <=20 SENSITIVE Sensitive     AMPICILLIN/SULBACTAM 4 SENSITIVE Sensitive     PIP/TAZO <=4 SENSITIVE Sensitive     *  MORGANELLA MORGANII  Blood Culture ID Panel (Reflexed)     Status: Abnormal   Collection Time: 01/22/23  6:42 PM  Result Value Ref Range Status   Enterococcus faecalis NOT DETECTED NOT DETECTED Final   Enterococcus Faecium NOT DETECTED NOT DETECTED Final   Listeria monocytogenes NOT DETECTED NOT DETECTED Final   Staphylococcus species NOT DETECTED NOT DETECTED Final   Staphylococcus aureus (BCID) NOT DETECTED NOT DETECTED Final   Staphylococcus epidermidis NOT DETECTED NOT DETECTED Final   Staphylococcus lugdunensis NOT DETECTED  NOT DETECTED Final   Streptococcus species NOT DETECTED NOT DETECTED Final   Streptococcus agalactiae NOT DETECTED NOT DETECTED Final   Streptococcus pneumoniae NOT DETECTED NOT DETECTED Final   Streptococcus pyogenes NOT DETECTED NOT DETECTED Final   A.calcoaceticus-baumannii NOT DETECTED NOT DETECTED Final   Bacteroides fragilis NOT DETECTED NOT DETECTED Final   Enterobacterales DETECTED (A) NOT DETECTED Final    Comment: Enterobacterales represent a large order of gram negative bacteria, not a single organism. Refer to culture for further identification. CRITICAL RESULT CALLED TO, READ BACK BY AND VERIFIED WITH: PHARMD CAREN AMEND ON 01/23/23 @ 1817 BY DRT    Enterobacter cloacae complex NOT DETECTED NOT DETECTED Final   Escherichia coli NOT DETECTED NOT DETECTED Final   Klebsiella aerogenes NOT DETECTED NOT DETECTED Final   Klebsiella oxytoca NOT DETECTED NOT DETECTED Final   Klebsiella pneumoniae NOT DETECTED NOT DETECTED Final   Proteus species NOT DETECTED NOT DETECTED Final   Salmonella species NOT DETECTED NOT DETECTED Final   Serratia marcescens NOT DETECTED NOT DETECTED Final   Haemophilus influenzae NOT DETECTED NOT DETECTED Final   Neisseria meningitidis NOT DETECTED NOT DETECTED Final   Pseudomonas aeruginosa NOT DETECTED NOT DETECTED Final   Stenotrophomonas maltophilia NOT DETECTED NOT DETECTED Final   Candida albicans NOT DETECTED NOT DETECTED Final   Candida auris NOT DETECTED NOT DETECTED Final   Candida glabrata NOT DETECTED NOT DETECTED Final   Candida krusei NOT DETECTED NOT DETECTED Final   Candida parapsilosis NOT DETECTED NOT DETECTED Final   Candida tropicalis NOT DETECTED NOT DETECTED Final   Cryptococcus neoformans/gattii NOT DETECTED NOT DETECTED Final   CTX-M ESBL NOT DETECTED NOT DETECTED Final   Carbapenem resistance IMP NOT DETECTED NOT DETECTED Final   Carbapenem resistance KPC NOT DETECTED NOT DETECTED Final   Carbapenem resistance NDM NOT DETECTED  NOT DETECTED Final   Carbapenem resist OXA 48 LIKE NOT DETECTED NOT DETECTED Final   Carbapenem resistance VIM NOT DETECTED NOT DETECTED Final    Comment: Performed at [facility], [address], [place], [place] [address]  MRSA Next Gen by PCR, Nasal     Status: None   Collection Time: 01/22/23 11:58 PM   Specimen: Nasal Mucosa; Nasal Swab  Result Value Ref Range Status   MRSA by PCR Next Gen NOT DETECTED NOT DETECTED Final    Comment: (NOTE) The GeneXpert MRSA Assay (FDA approved for NASAL specimens only), is one component of a comprehensive MRSA colonization surveillance program. It is not intended to diagnose MRSA infection nor to guide or monitor treatment for MRSA infections. Test performance is not FDA approved in patients less than 78 years old. Performed at [facility], [place] [address], [place], [place] [address]   Culture, blood (Routine X 2) w Reflex to ID Panel     Status: None (Preliminary result)   Collection Time: 01/24/23 10:59 AM   Specimen: BLOOD LEFT ARM  Result Value Ref Range Status  Specimen Description BLOOD LEFT ARM  Final   Special Requests   Final    BOTTLES DRAWN AEROBIC ONLY Blood Culture adequate volume   Culture   Final    NO GROWTH 3 DAYS Performed at [facility], [address], [place], [place] [address]    Report Status PENDING  Incomplete  Culture, blood (Routine X 2) w Reflex to ID Panel     Status: None (Preliminary result)   Collection Time: 01/24/23  7:21 PM   Specimen: BLOOD LEFT HAND  Result Value Ref Range Status   Specimen Description BLOOD LEFT HAND  Final   Special Requests IN PEDIATRIC BOTTLE Blood Culture adequate volume  Final   Culture   Final    NO GROWTH 3 DAYS Performed at [facility], [place] [address] [place],  [address]    Report Status PENDING  Incomplete    Studies/Results: No results found.    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  His central line has been removed I have repeated blood cultures that we will hopefully sterilize while he is having his "central line holiday".   #1 Bacteremia:  Would complete 14 days of cefepime which can be given with HD from date of first negative blood cultures = 01/24/23   #2 Coccygeal osteomyelitis: this is not fix-able with antibiotics  I agree he is at risk of recurrent admissions from sepsis from this process  We can continue some flagyl for 7 days during this hospitalization  #3 GOC: patient and family meeting with palliative re [place]  I spent 52 minutes with the patient including than 50% of the time in face to face an non face to face time with the patient regarding his gram-negative bacteremia associate with hemodialysis line coccygeal osteomyelitis along with review of medical records in preparation for the visit and during the visit and in coordination of his care.  I will sign off for now  Please call with further questions.   LOS: 5 days   Alcide Evener 01/27/2023, 1:14 PM

## 2023-01-27 NOTE — Progress Notes (Signed)
Pharmacy Antibiotic Note  [name] is a 77 y.o. male admitted on 01/22/2023 with sepsis. Patient has ESRD on HD MWF. He was discharged after hospitalization for cholecystitis and sacral ulcer to SNF on 1/30 and readmitted 1/31 with hypotension, tachypnea, tachycardia and WBC up to 19.5 from 11.3. R subclavian HD catheter removed 2/1. Polymicrobial BSI with E coli and Morganell morganii. Morganella with potential for AmpC production so will switch from Zosyn to cefepime. Pharmacy has been consulted for cefepime dosing. HD due today after HD cath placement.   Plan: Continue cefepime 1g IV q24h F/U HD schedule/tolerability, C/S  Height: '6\' 3"'$  (190.5 cm) Weight: 86.3 kg (190 lb 4.1 oz) IBW/kg (Calculated) : 84.5  Temp (24hrs), Avg:97.8 F (36.6 C), Min:97.5 F (36.4 C), Max:98.3 F (36.8 C)  Recent Labs  Lab 01/22/23 1738 01/22/23 1746 01/22/23 2005 01/23/23 0448 01/24/23 0602 01/25/23 1243 01/26/23 0104 01/27/23 0105 01/27/23 0448  WBC 19.5*  --   --  21.3* 17.2* 17.2*  --   --  18.1*  CREATININE 1.96*   < >  --  2.31* 3.06* 3.73* 3.89* 4.26*  --   LATICACIDVEN 5.5*  --  6.4* 2.2*  --   --   --   --   --    < > = values in this interval not displayed.     Estimated Creatinine Clearance: 17.4 mL/min (A) (by C-G formula based on SCr of 4.26 mg/dL (H)).    Allergies  Allergen Reactions   Metformin Nausea And Vomiting   Niacin    Niacin And Related Other (See Comments)    Reaction: flushing and burning side effects even with extended release    Antimicrobials this admission: Augmentin 1/24>> 1/30 Cefepime 1/31 x 1; 2/2>>c  Flagyl 1/31 x1; 2/2>>c  Vanco 1/31 Zosyn 2/1>>2/2  Microbiology results: 1/31 BCx: E coli (one set), morganella morganii (one set)  2/2 BCx: ngtd  Thank you for involving pharmacy in this patient's care.  Renold Genta, PharmD, BCPS Clinical Pharmacist Clinical phone for 01/27/2023 is [phone] 01/27/2023 9:21 AM

## 2023-01-27 NOTE — Procedures (Signed)
Interventional Radiology Procedure Note  Date of Procedure: 01/27/2023  Procedure: Tunneled HD line placement   Findings:  1. Right tunneled HD line placement via right IJ    Complications: No immediate complications noted.   Estimated Blood Loss: minimal  Follow-up and Recommendations: 1. Ready for use    Albin Felling, MD  Vascular & Interventional Radiology  01/27/2023 5:08 PM

## 2023-01-27 NOTE — Progress Notes (Signed)
Pt receives out-pt HD at [facility] on MWF. Will assist as needed.   Melven Sartorius Renal Navigator [phone]

## 2023-01-28 DIAGNOSIS — A419 Sepsis, unspecified organism: Secondary | ICD-10-CM | POA: Diagnosis not present

## 2023-01-28 DIAGNOSIS — M4628 Osteomyelitis of vertebra, sacral and sacrococcygeal region: Secondary | ICD-10-CM | POA: Diagnosis not present

## 2023-01-28 DIAGNOSIS — R7881 Bacteremia: Secondary | ICD-10-CM | POA: Diagnosis not present

## 2023-01-28 DIAGNOSIS — T827XXA Infection and inflammatory reaction due to other cardiac and vascular devices, implants and grafts, initial encounter: Secondary | ICD-10-CM | POA: Diagnosis not present

## 2023-01-28 LAB — TYPE AND SCREEN
ABO/RH(D): A POS
Antibody Screen: POSITIVE
Donor AG Type: NEGATIVE
Donor AG Type: NEGATIVE
Unit division: 0
Unit division: 0

## 2023-01-28 LAB — BPAM RBC
Blood Product Expiration Date: 202402162359
Blood Product Expiration Date: 202402162359
ISSUE DATE / TIME: 202402021500
Unit Type and Rh: 6200
Unit Type and Rh: 6200

## 2023-01-28 MED ORDER — CHLORHEXIDINE GLUCONATE CLOTH 2 % EX PADS
6.0000 | MEDICATED_PAD | Freq: Every day | CUTANEOUS | Status: DC
  Administered 2023-01-29 – 2023-02-02 (×5): 6 via TOPICAL

## 2023-01-28 MED ORDER — OXYCODONE HCL 5 MG PO TABS
5.0000 mg | ORAL_TABLET | Freq: Four times a day (QID) | ORAL | Status: DC | PRN
  Administered 2023-01-28 – 2023-02-03 (×17): 5 mg via ORAL
  Filled 2023-01-28 (×18): qty 1

## 2023-01-28 MED ORDER — HEPARIN SODIUM (PORCINE) 1000 UNIT/ML DIALYSIS
1000.0000 [IU] | INTRAMUSCULAR | Status: DC | PRN
  Administered 2023-01-28: 3800 [IU]

## 2023-01-28 MED ORDER — LIDOCAINE HCL URETHRAL/MUCOSAL 2 % EX GEL
1.0000 | Freq: Once | CUTANEOUS | Status: AC
  Administered 2023-01-28: 1 via TOPICAL
  Filled 2023-01-28: qty 6

## 2023-01-28 MED ORDER — HEPARIN SODIUM (PORCINE) 1000 UNIT/ML IJ SOLN
INTRAMUSCULAR | Status: AC
  Filled 2023-01-28: qty 4

## 2023-01-28 NOTE — Progress Notes (Addendum)
Notified provider of no relief in wound pain even after receiving medication.  Orders received

## 2023-01-28 NOTE — Progress Notes (Signed)
Post HD Tx   01/28/23 0149  Vitals  Temp 97.8 F (36.6 C)  Pulse Rate 91  Resp 16  BP 111/73  SpO2 100 %  O2 Device Room Air  Type of Weight Post-Dialysis  Oxygen Therapy  Patient Activity (if Appropriate) In bed  Pulse Oximetry Type Continuous  Post Treatment  Dialyzer Clearance Lightly streaked  Duration of HD Treatment -hour(s) 3.5 hour(s)  Liters Processed 67.5  Fluid Removed (mL) 1500 mL  Tolerated HD Treatment Yes  Post-Hemodialysis Comments Tx completed and tolerated well, UF goal reduced to support B/P.

## 2023-01-28 NOTE — Progress Notes (Signed)
[facility] PROGRESS NOTE  [name] (DOB: 05/17/1946) KGU:[id number] PCP: Redmond School, MD  Brief Narrative: [name] is a 77 y.o. male with a history of recent failure to thrive, ESRD (HD MWF), multivessel CAD, T2DM, HFrEF, PAFprolonged admission, bedbound with large tunneling sacral decubitus ulcer who was discharged to SNF 1/30 only to return 1/31 with septic shock due to E. coli bacteremia, Morganella also growing on cultures, coccygeal osteomyelitis causing metabolic encephalopathy. Antibiotics, guided by ID, have been given with improvement, liberation from pressors. HD catheter removed as a line holiday. When repeat cultures showed no growth > 48 hours, TDC catheter replaced on 2/5 and routine HD reinitiated. Palliative care has been involved, assisting with goals of care discussions. The patient's insight into his condition remains low. He desires all medical interventions. We will see if he can tolerate dialysis as an outpatient.  Subjective: States he wants tylenol for his butt. Other wise says "I'm fine." Getting blood drawn.  Objective: BP 105/66 (BP Location: Left Arm)   Pulse 91   Temp 97.6 F (36.4 C) (Oral)   Resp 19   Ht '6\' 3"'$  (1.905 m)   Wt 86.3 kg   SpO2 98%   BMI 23.78 kg/m   Gen: Frail, cachectic male in no distress Pulm: Clear, nonlabored  CV: RRR, no MRG, no significant edema GI: Soft, NT, ND, +BS  Neuro: Alert and interactive. No new focal deficits. Ext: Warm, no deformities Skin: Sacral wound not reassessed. Patient undergoing multiple stick attempts for phlebotomy this morning. There were no new rashes, lesions or ulcers on visualized skin   Assessment & Plan: Septic shock due to GNR (E. coli, Morganella) bacteremia due to chronic coccygeal osteomyelitis: Liberated from pressors. Note continued significant leukocytosis and now reactive thrombocytosis. No fevers since 1/31.  - Continue midodrine - Surgery consulted, no procedure  recommended.  - ID recommends 14 days of cefepime (w/HD) starting from date of first negative blood culture (currently 01/24/2023). Also giving flagyl x7 days per ID.  Sacral decubitus, POA:  - Continue offloading the wound, local wound care, optimizing nutrition, and discussing goals of care.  - Palliative following - Continue pain control as ordered per pt preference.  ESRD:  - Resumed regular HD MWF on 2/5. Appreciate nephrology assistance. - Concerns whether patient would be able to tolerate dialysis/sitting as an outpatient. Will have to check this.  Hyperkalemia: Spurious, resolved.    Acute metabolic encephalopathy: Due to septic shock, has improved considerably.  - Delirium precautions  Acute blood loss anemia on chronic macrocytic anemia and AOESRD:  - s/p 1u PRBCs 2/2 with improvement, no gross brisk bleeding noted, though likely having blood loss from wound. Looks on T&S like pt has Ab's. Will minimize transfusions as able. - ESA per nephrology - Supplementing folic acid in addition to broad multivitamin given low level (5.7)  Moderate protein calorie malnutrition:  - Encourage po intake, supp protein as able.   CAD: No anginal complaints. - Holding isosorbide with soft BP, ok to restart now - Continue ASA, statin  T2DM:  - SSI.   Chronic HFrEF: Last echo 12/12/2022 with LVEF 50-55%, no RWMA. Appears roughly euvolemic currently.  - Manage volume with HD.   Goals of care counseling/discussion:  - Appreciate palliative care assistance in this difficult situation.   Right cephalic (superficial) vein thrombosis: No DVT.  - Supportive care.   Patrecia Pour, MD [facility] [email or web address] 01/28/2023, 8:03 AM

## 2023-01-28 NOTE — Progress Notes (Signed)
Physical Therapy Wound Treatment Patient Details  Name: [name] MRN: [id number] Date of Birth: 1946/06/06  Today's Date: 01/28/2023 Time: 0812-0856 Time Calculation (min): 44 min  Subjective  Subjective Assessment Subjective: pt feeling better and agreeable to therapy Patient and Family Stated Goals: have less pain Prior Treatments: hydro therapy  Pain Score:  Pt given oral pain medication immediately prior to wound treatment, Facial pain score during treatment 6/10.   Wound Assessment  Pressure Injury 01/23/23 Sacrum Mid Unstageable - Full thickness tissue loss in which the base of the injury is covered by slough (yellow, tan, gray, green or brown) and/or eschar (tan, brown or black) in the wound bed. (Active)  Wound Image   01/24/23 1500  Dressing Type ABD;Gauze (Comment);Honey;Barrier Film (skin prep) 01/28/23 1000  Dressing Changed 01/28/23 1000  Dressing Change Frequency Daily 01/28/23 1000  State of Healing Non-healing 01/28/23 1000  Site / Wound Assessment Black;Brown;Yellow;Painful;Pink 01/28/23 1000  % Wound base Red or Granulating 65% 12/31/22 0400  % Wound base Yellow/Fibrinous Exudate 5% 01/28/23 1000  % Wound base Black/Eschar 90% 01/28/23 1000  % Wound base Other/Granulation Tissue (Comment) 5% 01/28/23 1000  Peri-wound Assessment Black;Other (Comment);Maceration;Pink;Denuded 01/28/23 1000  Wound Length (cm) 7.8 cm 01/24/23 1500  Wound Width (cm) 4.7 cm 01/24/23 1500  Wound Depth (cm) 3.1 cm 01/24/23 1500  Wound Surface Area (cm^2) 36.66 cm^2 01/24/23 1500  Wound Volume (cm^3) 113.65 cm^3 01/24/23 1500  Tunneling (cm) 5 01/23/23 0000  Undermining (cm) entirity of the wound has undermining, 12 o'clock 2.2 cm, 3 o'clock 1.2 cm, 6 o'clock 1.4 cm, 9 o'clock 3.5 01/24/23 1500  Margins Unattached edges (unapproximated) 01/28/23 1000  Drainage Amount Moderate 01/28/23 1000  Drainage Description Odor - foul;Serosanguineous 01/28/23 1000  Treatment  Irrigation;Debridement (Selective) 01/28/23 1000   Selective Debridement (non-excisional) Selective Debridement (non-excisional) - Location: Sacrum Selective Debridement (non-excisional) - Tools Used: Forceps, Scalpel Selective Debridement (non-excisional) - Tissue Removed: gray black fibrin necrotic, tissue, from base of the wound and hardened black and yellow eschar around margin of the wound    Wound Assessment and Plan  Wound Therapy - Assess/Plan/Recommendations Wound Therapy - Clinical Statement: Pt agreeable to hydrotherapy, and acknowledges that his wound is large. Hopefully, asks if any healing is present. PT provides education that at this point necrotic "dead" material is being removed and that there is no appreciable healing/health tissue can be visualize. Able to continue to remove hardened eschar from the wound margin and black/brown stringy necrotic tissue (likely necrotic ligamentous tissue) from the base of the wound. Drainage continues to be foul smelling. Given pt's desire to remain a full code, and lacks means (nutrition, mobility, presence of active bone infection) for good wound healing , selective debridement in conjunction with autolytic debridement from [place] are only means of cleaning the wound bed. Unfortunately, the base of the wound is largely sacral ligamentous tissue and bone. PT will continue to follow for irrigation and selective debridement to decrease bioburden and attain the healthiest wound bed possible at this time. Wound Therapy - Functional Problem List: decreased mobility Factors Delaying/Impairing Wound Healing: Diabetes Mellitus, Infection - systemic/local, Multiple medical problems, Immobility, Incontinence Hydrotherapy Plan: Debridement, Dressing change, Patient/family education Wound Therapy - Frequency:  (2x/wk) Wound Therapy - Current Recommendations: WOC nurse Wound Therapy - Follow Up Recommendations: dressing changes by RN  Wound Therapy  Goals- Improve the function of patient's integumentary system by progressing the wound(s) through the phases of wound healing (inflammation - proliferation - remodeling) by: Wound  Therapy Goals - Improve the function of patient's integumentary system by progressing the wound(s) through the phases of wound healing by: Decrease Necrotic Tissue to: 80 Decrease Necrotic Tissue - Progress: Not progressing Increase Granulation Tissue to: 10 Increase Granulation Tissue - Progress: Mot progressing Additional Wound Therapy Goal - Progress: Goal set today Goals/treatment plan/discharge plan were made with and agreed upon by patient/family: Yes Time For Goal Achievement: 7 days Wound Therapy - Potential for Goals: Poor  Goals will be updated until maximal potential achieved or discharge criteria met.  Discharge criteria: when goals achieved, discharge from hospital, MD decision/surgical intervention, no progress towards goals, refusal/missing three consecutive treatments without notification or medical reason.  GP     Charges PT Wound Care Charges $Wound Debridement up to 20 cm: < or equal to 20 cm $ Wound Debridement each add'l 20 sqcm: 2 $PT Hydrotherapy Visit: 1 Visit     Zoeya Gramajo B. Migdalia Dk PT, DPT Acute Rehabilitation Services Please use secure chat or  Call Office [phone]   [place] 01/28/2023, 10:43 AM

## 2023-01-28 NOTE — Progress Notes (Addendum)
[facility] NEPHROLOGY PROGRESS NOTE  Summary Pt is a 77 y.o. yo male with ESRD on HD, CHF, DM, A-fib, CAD, mostly bedbound with physical deconditioning known large sacral decubitus ulcer, recently discharged from hospital on 1/30 now presented back with septic shock.   Subjective: Seen in room. 1500 cc off w/ HD overnight. Got new TDC also yesterday.   Physical Exam: General: Elderly looking frail male lying on bed in ICU. Heart:RRR, s1s2 nl Lungs:clear b/l, no crackle Abdomen:soft, Non-tender, non-distended Extremities:No edema Dialysis Access: RIJ TDC in place   Dialysis Orders: [facility] MWF 4h   400/800   83kg  3K/2.25Ca bath  R TDC   Heparin 4000 units - Full 4hr HD 1/31, was below new EDW - left 78.6kg - Mircera 225 mcg IV q 2 weeks (due 2/2, last given Aranesp 257mg on 1/26) - no VDRA  Assessment/ Plan # Septic shock /  E. coli bacteremia / coccygeal osteomyelitis / sacral decub - off of pressors, on Cefepime.  Seen by general surgery recommended no acute surgical need at this time.  HD catheter was removed on 2/1 for line holiday.  Blood culture repeated 2/2 and were negative. New TDC placed 2/05 by IR.   # ESRD - MWF HD. Had HD last night, next HD tomorrow.   # Hypotension/volume - on midodrine w/ BP's low normal. Up 3kg by wt, no edema on exam. On RA.   # Secondary hyperparathyroidism: Calcium and phosphorus level acceptable.  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  # Sacral decub - tunneled wound  # Goals of care: Patient with multiple admission, stage IV sacral decubiti, physical deconditioning.  Palliative care team met w/ patient and family on 2/05. Pt would not agree to any limits on his care. Full code.   RKelly Splinter MD 01/28/2023, 3:20 PM  Recent Labs  Lab 01/25/23 1243 01/26/23 0104 01/27/23 0105 01/27/23 0448  HGB 9.9*  --   --  9.2*  ALBUMIN 1.7* 1.6* 1.7*  --   CALCIUM 9.3 9.0 9.3   --   PHOS 3.6 3.6 4.5  --   CREATININE 3.73* 3.89* 4.26*  --   K 3.8 3.8 5.7* 3.9    Inpatient medications:  (feeding supplement) PROSource Plus  30 mL Oral BID BM   acetaminophen  1,000 mg Oral Q8H   aspirin EC  81 mg Oral Daily   atorvastatin  40 mg Oral Daily   Chlorhexidine Gluconate Cloth  6 each Topical Daily   Chlorhexidine Gluconate Cloth  6 each Topical Q0600   darbepoetin (ARANESP) injection - DIALYSIS  100 mcg Subcutaneous Q Fri-1800   feeding supplement  237 mL Oral TID BM   folic acid  1 mg Oral Daily   gabapentin  100 mg Oral TID   heparin  5,000 Units Subcutaneous Q8H   isosorbide dinitrate  5 mg Oral BID   leptospermum manuka honey  1 Application Topical Daily   metroNIDAZOLE  500 mg Oral Q12H   midodrine  10 mg Oral Q M,W,F-HD   midodrine  10 mg Oral Q Sat-HD   multivitamin  1 tablet Oral QHS   pantoprazole (PROTONIX) IV  40 mg Intravenous Daily    sodium chloride Stopped (01/24/23 2146)   ceFEPime (MAXIPIME) IV 200 mL/hr at 01/28/23 0558   sodium chloride, oxyCODONE

## 2023-01-29 DIAGNOSIS — A419 Sepsis, unspecified organism: Secondary | ICD-10-CM | POA: Diagnosis not present

## 2023-01-29 DIAGNOSIS — R6521 Severe sepsis with septic shock: Secondary | ICD-10-CM | POA: Diagnosis not present

## 2023-01-29 LAB — CBC
HCT: 27.1 % — ABNORMAL LOW (ref 39.0–52.0)
Hemoglobin: 8.8 g/dL — ABNORMAL LOW (ref 13.0–17.0)
MCH: 31.7 pg (ref 26.0–34.0)
MCHC: 32.5 g/dL (ref 30.0–36.0)
MCV: 97.5 fL (ref 80.0–100.0)
Platelets: 407 10*3/uL — ABNORMAL HIGH (ref 150–400)
RBC: 2.78 MIL/uL — ABNORMAL LOW (ref 4.22–5.81)
RDW: 18.2 % — ABNORMAL HIGH (ref 11.5–15.5)
WBC: 15.5 10*3/uL — ABNORMAL HIGH (ref 4.0–10.5)
nRBC: 0 % (ref 0.0–0.2)

## 2023-01-29 LAB — RENAL FUNCTION PANEL
Albumin: 1.7 g/dL — ABNORMAL LOW (ref 3.5–5.0)
Anion gap: 12 (ref 5–15)
BUN: 48 mg/dL — ABNORMAL HIGH (ref 8–23)
CO2: 25 mmol/L (ref 22–32)
Calcium: 9 mg/dL (ref 8.9–10.3)
Chloride: 97 mmol/L — ABNORMAL LOW (ref 98–111)
Creatinine, Ser: 3.29 mg/dL — ABNORMAL HIGH (ref 0.61–1.24)
GFR, Estimated: 19 mL/min — ABNORMAL LOW (ref >60)
Glucose, Bld: 114 mg/dL — ABNORMAL HIGH (ref 70–99)
Phosphorus: 3.3 mg/dL (ref 2.5–4.6)
Potassium: 3.5 mmol/L (ref 3.5–5.1)
Sodium: 134 mmol/L — ABNORMAL LOW (ref 135–145)

## 2023-01-29 LAB — CULTURE, BLOOD (ROUTINE X 2)
Culture: NO GROWTH
Culture: NO GROWTH
Special Requests: ADEQUATE
Special Requests: ADEQUATE

## 2023-01-29 MED ORDER — HEPARIN SODIUM (PORCINE) 1000 UNIT/ML IJ SOLN
INTRAMUSCULAR | Status: AC
  Administered 2023-01-29: 3800 [IU]
  Filled 2023-01-29: qty 4

## 2023-01-29 MED ORDER — HEPARIN SODIUM (PORCINE) 1000 UNIT/ML IJ SOLN
INTRAMUSCULAR | Status: AC
  Administered 2023-01-29: 4000 [IU]
  Filled 2023-01-29: qty 4

## 2023-01-29 NOTE — Progress Notes (Signed)
   01/29/23 1228  Vitals  Temp 98.1 F (36.7 C)  Temp Source Oral  BP 107/78  MAP (mmHg) 88  BP Location Left Arm  BP Method Automatic  Patient Position (if appropriate) Lying  ECG Heart Rate 98  Resp 20  Oxygen Therapy  SpO2 100 %  O2 Device Room Air   Patient arrived back from dialysis unit, vitals obtained and patient placed back on monitor call bell within reach. Martese Vanatta, Bettina Gavia RN

## 2023-01-29 NOTE — Hospital Course (Addendum)
[name] is a 77 y.o. M with ESRD on HD MWF, CAD, DM, sCHF previous EF 30-35% now recovered, recent episode of chronic cholecystitis and then prolonged hospitalization last month for NSTEMI complicated by infected sacral decubitus ulcer who was brought from dialysis due to hypotension.    8/6-8/19/23: Admitted for fluid overload, started HD, discharged to SNF  8/28-08/23/22: Readmitted to [facility] for epigastric pain, found to have cholecystitis, IR placed cholecystostomy tube  9/22-9/25/23: Readmitted to [facility] for hypotension, hypoglycemia  09/24/22: Sacral pressure injury noted by General Surgery, already had been discharged from rehab and patient reporting to Gen Surg "He is not able to walk and he says he wants to go back to a rehab to get stronger."  12/12/22-01/21/23: Presented for elective cholecystectomy, found during preop eval to have diffuse ECG changes, transferred to [facility] for left heart cath; able to have lap chole on 12/28, then hospitalization then prolonged by infection of sacral decubitus ulcer, now stage IV, requiring IV antibiotics    01/22/23: Found to be hypotensive during first HD session post-discharge, sent back to hospital and admitted to ICU on pressors 2/1: Blood cultures growing E coli and Morganella; ID and Nephrology consulted; tunneled line removed; CT abdomen shows sacral ulcer with coccygeal osteomyelitis 2/5: Tunneled line replaced 2/6-present: Awaiting placement 02/03/2023: Patient is complaining of severe pain in backside due to long intervals between oxy dosing. Will increase frequency to q4 hours. 02/04/2023: No new complaints. The patient will need to stay until Thursday to complete his IV antibiotics as the facility will not be able to give them IV. He also will need to be able to sit up for dialysis.

## 2023-01-29 NOTE — Progress Notes (Signed)
[facility] PROGRESS NOTE  Subjective: Seen in dialysis. Asking "how am I doing in the long run". Told him his body was very sick and tired and weak. Didn't want to talk about life and death. No new c/o's.   Physical Exam: General: Elderly looking frail male lying on bed in ICU. Heart:RRR, s1s2 nl Lungs:clear b/l, no crackle Abdomen:soft, Non-tender, non-distended Extremities:No edema Dialysis Access: RIJ TDC in place   Dialysis Orders: [facility] MWF 4h   400/800   83kg  3K/2.25Ca bath  R TDC   Heparin 4000 units - Full 4hr HD 1/31, was below new EDW - left 78.6kg - Mircera 225 mcg IV q 2 weeks (due 2/2, last given Aranesp 215mg on 1/26) - no VDRA  Summary Pt is a 77y.o. yo male with ESRD on HD, CHF, DM, A-fib, CAD, mostly bedbound with physical deconditioning known large sacral decubitus ulcer, recently discharged from hospital on 1/30 now presented back with septic shock.   Assessment/ Plan # Septic shock /  E. coli bacteremia / coccygeal osteomyelitis / sacral decub - off of pressors, on Cefepime.  Seen by general surgery recommended no acute surgical needs.  HD catheter was removed on 2/1 for line holiday.  Blood culture repeated 2/2 and were negative. New TDC placed 2/05 by IR.   # ESRD - MWF HD. HD today.   # Hypotension/volume - on midodrine w/ BP's low normal. Up 3kg by wt, no edema on exam. Is on room air.   # Secondary hyperparathyroidism: Calcium and phosphorus level acceptable.  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  # Sacral decub - tunneled wound  # Goals of care: Patient with multiple admission, stage IV sacral decubiti, physical deconditioning.  Palliative care team met w/ patient and family on 2/05. Pt did not want to discuss life/ death issues. Full code.   RKelly Splinter MD 01/29/2023, 9:56 AM  Recent Labs  Lab 01/27/23 0105 01/27/23 0448 01/29/23 0826  HGB  --  9.2* 8.8*   ALBUMIN 1.7*  --  1.7*  CALCIUM 9.3  --  9.0  PHOS 4.5  --  3.3  CREATININE 4.26*  --  3.29*  K 5.7* 3.9 3.5     Inpatient medications:  (feeding supplement) PROSource Plus  30 mL Oral BID BM   acetaminophen  1,000 mg Oral Q8H   aspirin EC  81 mg Oral Daily   atorvastatin  40 mg Oral Daily   Chlorhexidine Gluconate Cloth  6 each Topical Daily   Chlorhexidine Gluconate Cloth  6 each Topical Q0600   Chlorhexidine Gluconate Cloth  6 each Topical Q0600   darbepoetin (ARANESP) injection - DIALYSIS  100 mcg Subcutaneous Q Fri-1800   feeding supplement  237 mL Oral TID BM   folic acid  1 mg Oral Daily   gabapentin  100 mg Oral TID   heparin  5,000 Units Subcutaneous Q8H   heparin sodium (porcine)       isosorbide dinitrate  5 mg Oral BID   leptospermum manuka honey  1 Application Topical Daily   metroNIDAZOLE  500 mg Oral Q12H   midodrine  10 mg Oral Q M,W,F-HD   midodrine  10 mg Oral Q Sat-HD   multivitamin  1 tablet Oral QHS   pantoprazole (PROTONIX) IV  40 mg Intravenous Daily    sodium chloride Stopped (01/24/23 2146)   ceFEPime (MAXIPIME) IV 1 g (01/28/23 2154)  sodium chloride, heparin sodium (porcine), oxyCODONE

## 2023-01-29 NOTE — Progress Notes (Signed)
Received patient in bed to unit.  Alert and oriented.  Informed consent signed and in chart.   [place] duration:2hrs 51mn  Patient tolerated well.  Transported back to the room  Alert, without acute distress.  Hand-off given to patient's nurse.   Access used: Catheter Access issues: lines reversed. Poor catheter performance.  Total UF removed: 1.7 Medication(s) given: none Post HD VS: 98.6,109/65,92,83,17 Post HD weight: 94.9kg   VDonah DriverKidney Dialysis Unit

## 2023-01-29 NOTE — Assessment & Plan Note (Addendum)
See below.

## 2023-01-29 NOTE — Assessment & Plan Note (Addendum)
Seen by ID and General Surgery.  No debridement would be helpful.  ID note that coccygeal osteo is not curable and patient is high risk for recurrent infection and readmission for this.  He has completed 7 days Flagyl and is on day 9 of 14 of Cefepime EOT 2/15 - Continue cefepime - Continue midodrine - Follow up with ID after discharge - Continue oxycodone for pain - Dakins and medihoney on non-hydro days  We will continue hydrotherapy while inpatient, although per PT, the benefit appears to be low, and we will plan to stop hydrotherapy at discharge from acute inpatient care. Osteo will likely never be cured.

## 2023-01-29 NOTE — Assessment & Plan Note (Addendum)
Hgb stable - Continue Aranesp, folate _ Iron supplementation held due to active infection

## 2023-01-29 NOTE — Assessment & Plan Note (Addendum)
-   Consult nephrology for HD _ May discharge to SNF when able to sit up for HD.

## 2023-01-29 NOTE — Assessment & Plan Note (Addendum)
Resolved

## 2023-01-29 NOTE — Assessment & Plan Note (Signed)
See above

## 2023-01-29 NOTE — Progress Notes (Signed)
  Progress Note   Patient: [name] VBT:[id number] DOB: [birth year] DOA: 01/22/2023     7 DOS: the patient was seen and examined on 01/29/2023        Brief hospital course: 77 y.o. male with a history of recent failure to thrive, ESRD (HD MWF), multivessel CAD, T2DM, HFrEF, PAFprolonged admission, bedbound with large tunneling sacral decubitus ulcer who was discharged to SNF 1/30 only to return 1/31 with septic shock due to E. coli bacteremia, Morganella also growing on cultures, coccygeal osteomyelitis causing metabolic encephalopathy.      Assessment and Plan: * Septic shock (HCC) - Continue cefepime, flagyl - Continue midodrine  Chronic osteomyelitis of coccyx (HCC) - Continue cefepime - Continue oxycodone - Hydrotherapy for wound  Diabetes mellitus type 2 in obese (HCC) - Continue aspirin and gabapentin and atorvastatin  Infection of bloodstream concurrent with and due to presence of hemodialysis catheter ([place]) See above  E coli bacteremia See above  Acute metabolic encephalopathy Resolved  End stage renal disease on dialysis [facility]) - Consult nephrology for HD  Iron deficiency anemia - Continue Aranesp          Subjective: Pain in his buttocks.  No fever, no confusion, no respiratory symptoms.     Physical Exam: BP 124/64 (BP Location: Left Arm)   Pulse 85   Temp 98.3 F (36.8 C) (Oral)   Resp 16   Ht '6\' 3"'$  (1.905 m)   Wt 94.9 kg   SpO2 100%   BMI 26.15 kg/m   Elderly adult male, lying in bed, no acute distress RRR, no murmurs, no peripheral edema Respiratory rate normal, lungs clear without rales or wheezes Abdomen soft no tenderness palpation or guarding, no ascites or distention  Data Reviewed: White blood cell count down to 15, hemoglobin 8, no change  Family Communication: Wife by phone    Disposition: Status is: Inpatient         Author: Edwin Dada, MD 01/29/2023 6:38 PM  For on call review  [email or web address].

## 2023-01-29 NOTE — Assessment & Plan Note (Addendum)
Glucose well controlled off insulin. - Continue aspirin and gabapentin and atorvastatin

## 2023-01-29 NOTE — Care Management Important Message (Signed)
Important Message  Patient Details  Name: [name] MRN: [id number] Date of Birth: [birth year]   Medicare Important Message Given:  Yes     Shelda Altes 01/29/2023, 8:20 AM

## 2023-01-30 DIAGNOSIS — E1169 Type 2 diabetes mellitus with other specified complication: Secondary | ICD-10-CM

## 2023-01-30 DIAGNOSIS — G9341 Metabolic encephalopathy: Secondary | ICD-10-CM | POA: Diagnosis not present

## 2023-01-30 DIAGNOSIS — M4628 Osteomyelitis of vertebra, sacral and sacrococcygeal region: Secondary | ICD-10-CM | POA: Diagnosis not present

## 2023-01-30 DIAGNOSIS — N186 End stage renal disease: Secondary | ICD-10-CM | POA: Diagnosis not present

## 2023-01-30 DIAGNOSIS — D509 Iron deficiency anemia, unspecified: Secondary | ICD-10-CM

## 2023-01-30 DIAGNOSIS — E669 Obesity, unspecified: Secondary | ICD-10-CM

## 2023-01-30 DIAGNOSIS — A419 Sepsis, unspecified organism: Secondary | ICD-10-CM | POA: Diagnosis not present

## 2023-01-30 DIAGNOSIS — R7881 Bacteremia: Secondary | ICD-10-CM | POA: Diagnosis not present

## 2023-01-30 DIAGNOSIS — T827XXA Infection and inflammatory reaction due to other cardiac and vascular devices, implants and grafts, initial encounter: Secondary | ICD-10-CM | POA: Diagnosis not present

## 2023-01-30 LAB — RENAL FUNCTION PANEL
Albumin: 1.7 g/dL — ABNORMAL LOW (ref 3.5–5.0)
Anion gap: 16 — ABNORMAL HIGH (ref 5–15)
BUN: 40 mg/dL — ABNORMAL HIGH (ref 8–23)
CO2: 21 mmol/L — ABNORMAL LOW (ref 22–32)
Calcium: 9 mg/dL (ref 8.9–10.3)
Chloride: 97 mmol/L — ABNORMAL LOW (ref 98–111)
Creatinine, Ser: 2.67 mg/dL — ABNORMAL HIGH (ref 0.61–1.24)
GFR, Estimated: 24 mL/min — ABNORMAL LOW (ref >60)
Glucose, Bld: 95 mg/dL (ref 70–99)
Phosphorus: 2.7 mg/dL (ref 2.5–4.6)
Potassium: 4.4 mmol/L (ref 3.5–5.1)
Sodium: 134 mmol/L — ABNORMAL LOW (ref 135–145)

## 2023-01-30 LAB — CBC
HCT: 30.4 % — ABNORMAL LOW (ref 39.0–52.0)
Hemoglobin: 9.7 g/dL — ABNORMAL LOW (ref 13.0–17.0)
MCH: 31.2 pg (ref 26.0–34.0)
MCHC: 31.9 g/dL (ref 30.0–36.0)
MCV: 97.7 fL (ref 80.0–100.0)
Platelets: 182 10*3/uL (ref 150–400)
RBC: 3.11 MIL/uL — ABNORMAL LOW (ref 4.22–5.81)
RDW: 18.7 % — ABNORMAL HIGH (ref 11.5–15.5)
WBC: 11.8 10*3/uL — ABNORMAL HIGH (ref 4.0–10.5)
nRBC: 0 % (ref 0.0–0.2)

## 2023-01-30 LAB — GLUCOSE, CAPILLARY: Glucose-Capillary: 93 mg/dL (ref 70–99)

## 2023-01-30 MED ORDER — CHLORHEXIDINE GLUCONATE CLOTH 2 % EX PADS
6.0000 | MEDICATED_PAD | Freq: Every day | CUTANEOUS | Status: DC
  Administered 2023-01-31 – 2023-02-02 (×3): 6 via TOPICAL

## 2023-01-30 MED ORDER — DAKINS (1/4 STRENGTH) 0.125 % EX SOLN
Freq: Two times a day (BID) | CUTANEOUS | Status: DC
  Filled 2023-01-30 (×2): qty 473

## 2023-01-30 MED ORDER — PANTOPRAZOLE SODIUM 40 MG PO TBEC
40.0000 mg | DELAYED_RELEASE_TABLET | Freq: Every day | ORAL | Status: DC
  Administered 2023-01-30 – 2023-02-08 (×9): 40 mg via ORAL
  Filled 2023-01-30 (×9): qty 1

## 2023-01-30 NOTE — Plan of Care (Addendum)
Patient is AxOx3, disoriented to time. Dressing changed on coccyx. Patient was dangled on edge of bed with 2 people assist. He has muscle weakness, stiffness, and was unable to sit by himself.Patient would benefit from daily PT treatment and Rehab to regain muscle strength. Patient was turned from one side to another in bed Q2hrs. Was medicated with Oxycodone for significant pain at site of his wound on coccyx. Family visited during the day and was updated.  1800 Patient didn't voided during the day. I bladder scanned him at this time and he has 160 ml in hi bladder. PO intake encouraged.

## 2023-01-30 NOTE — TOC Progression Note (Signed)
Transition of Care [facility]) - Progression Note    Patient Details  Name: [name] MRN: [id number] Date of Birth: Oct 27, 1946  Transition of Care [facility]) CM/SW [place], RN Phone Number: 01/30/2023, 4:32 PM  Clinical Narrative:    CM met with the patient at the bedside to discuss transitions of care needs.  The patient states that he was living with his niece, Justice Rocher at the home prior to his last hospitalization.  He was recently discharged from [facility] and is agreeable to SNF placement.  PT is recommending LTC at this time during last evaluation.  The patient is currently receiving hydrotherapy for sacral wound care and IV antibiotics.  I spoke with the attending physician, Dr. Loleta Books and the patient may be appropriate for LTAC placement for woundcare therapy versus SNF placement.  [facility] does not have available beds for HD patient at this time.  I called [facility] and spoke with Burgess Estelle and she states that she will review the patient's clinical needs for appropriateness for LTAC and will follow up with me.  Otherwise, if no availability to [facility] admission - I will fax the patient out for SNF admission, once need for hydrotherapy has been discontinued.    CM will continue to follow the patient for SNF versus LTAC placement.   Expected Discharge Plan: [place] Barriers to Discharge: Continued Medical Work up  Expected Discharge Plan and Services   Discharge Planning Services: CM Consult Post Acute Care Choice: [place] (SNF versus LTAC) Living arrangements for the past 2 months: Single Family Home                                       Social Determinants of Health (SDOH) Interventions SDOH Screenings   Food Insecurity: No Food Insecurity (01/05/2023)  Housing: Low Risk  (12/17/2022)  Transportation Needs: No Transportation Needs (12/17/2022)  Utilities: Not At Risk (12/17/2022)   Tobacco Use: Medium Risk (01/28/2023)    Readmission Risk Interventions    01/30/2023    4:23 PM 01/20/2023   10:26 AM 09/16/2022   12:31 PM  Readmission Risk Prevention Plan  Transportation Screening Complete Complete Complete  Medication Review Press photographer) Complete Complete Complete  PCP or Specialist appointment within 3-5 days of discharge Complete Complete   HRI or Home Care Consult  Complete Complete  SW Recovery Care/Counseling Consult Complete Complete Complete  Palliative Care Screening Complete Complete Not Applicable  [place] Complete Complete Not Applicable

## 2023-01-30 NOTE — Progress Notes (Signed)
Pt's case discussed with attending, nephrologist,and TOC staff yesterday. Pt will have to be able to tolerate HD in the chair in order to be appropriate for out-pt HD. Pt trialed HD in the chair last admission prior to d/c. Will likely need to trial again. Will assist as needed.   Melven Sartorius Renal Navigator [phone]

## 2023-01-30 NOTE — Assessment & Plan Note (Addendum)
As evidenced by loss of subcutaneous muscle mass and fat, weight loss >10 kg in last 8 months. Nutrition consulted.

## 2023-01-30 NOTE — Progress Notes (Signed)
[facility] NEPHROLOGY PROGRESS NOTE  Subjective: Seen in room.   Physical Exam: General: Elderly looking frail male lying on bed in ICU. Heart:RRR, s1s2 nl Lungs:clear b/l, no crackle Abdomen:soft, Non-tender, non-distended Extremities:No edema Dialysis Access: RIJ TDC in place   Dialysis Orders: [facility] MWF 4h   400/800   83kg  3K/2.25Ca bath  R TDC   Heparin 4000 units - Full 4hr HD 1/31, was below new EDW - left 78.6kg - Mircera 225 mcg IV q 2 weeks (due 2/2, last given Aranesp 258mg on 1/26) - no VDRA  Summary Pt is a 77y.o. yo male with ESRD on HD, CHF, DM, A-fib, CAD, mostly bedbound with physical deconditioning known large sacral decubitus ulcer, recently discharged from hospital on 1/30 now presented back with septic shock.   Assessment/ Plan # Septic shock /  E. coli bacteremia / coccygeal osteomyelitis / sacral decub - off of pressors, on Cefepime.  Seen by general surgery recommended no acute surgical needs.  HD catheter was removed on 2/1 for line holiday.  Blood culture repeated 2/2 and were negative. New TDC placed 2/05 by IR.   # ESRD - MWF HD. HD tomorrow.   # Hypotension/volume - on midodrine w/ BP's low normal. Weights are off. No edema on exam. Is on room air.   # Secondary hyperparathyroidism: Calcium and phosphorus level acceptable.  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  # Sacral decub - tunneled wound  # Goals of care: Patient with multiple admission, stage IV sacral decubiti, physical deconditioning.  Palliative care team met w/ patient and family on 2/05. Pt did not want to discuss life/ death issues. Full code. Will try to get pt up in chair for HD.   RKelly Splinter MD 01/30/2023, 4:29 PM  Recent Labs  Lab 01/29/23 0826 01/30/23 0342  HGB 8.8* 9.7*  ALBUMIN 1.7* 1.7*  CALCIUM 9.0 9.0  PHOS 3.3 2.7  CREATININE 3.29* 2.67*  K 3.5 4.4     Inpatient medications:  (feeding  supplement) PROSource Plus  30 mL Oral BID BM   acetaminophen  1,000 mg Oral Q8H   aspirin EC  81 mg Oral Daily   atorvastatin  40 mg Oral Daily   Chlorhexidine Gluconate Cloth  6 each Topical Q0600   darbepoetin (ARANESP) injection - DIALYSIS  100 mcg Subcutaneous Q Fri-1800   feeding supplement  237 mL Oral TID BM   folic acid  1 mg Oral Daily   gabapentin  100 mg Oral TID   heparin  5,000 Units Subcutaneous Q8H   isosorbide dinitrate  5 mg Oral BID   leptospermum manuka honey  1 Application Topical Daily   metroNIDAZOLE  500 mg Oral Q12H   midodrine  10 mg Oral Q M,W,F-HD   midodrine  10 mg Oral Q Sat-HD   multivitamin  1 tablet Oral QHS   pantoprazole  40 mg Oral Daily   sodium hypochlorite   Irrigation BID    sodium chloride Stopped (01/24/23 2146)   ceFEPime (MAXIPIME) IV 1 g (01/29/23 2252)   sodium chloride, oxyCODONE

## 2023-01-30 NOTE — Progress Notes (Addendum)
  Progress Note   Patient: [name] ZES:[id number] DOB: 1946/02/24 DOA: 01/22/2023     8 DOS: the patient was seen and examined on 01/30/2023 at 8:41AM      Brief hospital course: Mr. Harty is a 77 y.o. M with ESRD on HD MWF, CAD, DM, sCHF previous EF 30-35% now recovered, recent episode of chronic cholecystitis and then prolonged hospitalization last month for NSTEMI complicated by infected sacral decubitus ulcer who was brought from dialysis due to hypotension.    8/6-8/19/23: Admitted for fluid overload, started HD, discharged to SNF  8/28-08/23/22: Readmitted to [facility] for epigastric pain, found to have cholecystitis, IR placed cholecystostomy tube  9/22-9/25/23: Readmitted to [facility] for hypotension, hypoglycemia  09/24/22: Sacral pressure injury noted by General Surgery, already had been discharged from rehab and patient reporting to Gen Surg "He is not able to walk and he says he wants to go back to a rehab to get stronger."  12/12/22-01/21/23: Presented for elective cholecystectomy, found during preop eval to have diffuse ECG changes, transferred to [facility] for left heart cath; able to have lap chole on 12/28, then hospitalization then prolonged by infection of sacral decubitus ulcer, now stage IV, requiring IV antibiotics    01/22/23: Found to be hypotensive during first HD session post-discharge, sent back to hospital and admitted to ICU on pressors 2/1: Blood cultures growing E coli and Morganella; ID and Nephrology consulted; tunneled line removed; CT abdomen shows sacral ulcer with coccygeal osteomyelitis 2/5: Tunneled line replaced         Assessment and Plan: * Septic shock due to E coli and Morganella bacteremia due to coccygeal osteomyelitis due to sacral ulcer - Continue cefepime, flagyl - Continue midodrine  Chronic osteomyelitis of coccyx (HCC) - Continue antibiotics - Continue oxycodone - Dakins and medihoney on non-hydro days  We will continue hydrotherapy while  inpatient, although benefit appears to be low, and hydrotherapy doesn't need to continue at SNF  Will need skilled wound care and prolonged IV antibiotics after discharge at SNF  Diabetes mellitus type 2 in obese (HCC) Glucose well controlled - Continue aspirin and gabapentin and atorvastatin  Acute metabolic encephalopathy Resolved  Malnutrition of moderate degree As evidenced by loss of subcutaneous muscle mass and fat, weight loss >10 kg in last 8 months  End stage renal disease on dialysis [facility]) - Consult nephrology for HD  Iron deficiency anemia - Continue Aranesp          Subjective: Patient's body is sore.  He has had no fever, confusion, respiratory symptoms.  No new nursing concerns.     Physical Exam: BP 130/66 (BP Location: Left Arm)   Pulse 87   Temp 98.6 F (37 C) (Oral)   Resp 16   Ht '6\' 3"'$  (1.905 m)   Wt 94.1 kg   SpO2 98%   BMI 25.93 kg/m   Elderly adult male, lying in bed, no acute distress RRR, no murmurs, no peripheral edema Respiratory rate normal, lungs clear without rales or wheezes  Data Reviewed: Blood blood cell count down to 13, hemoglobin 11, no change, Creatinine down to 2.6    Family Communication:      Disposition: Status is: Inpatient         Author: Edwin Dada, MD 01/30/2023 3:42 PM  For on call review [email or web address].

## 2023-01-30 NOTE — Evaluation (Signed)
Physical Therapy Evaluation and Discharge Patient Details Name: [name] MRN: [id number] DOB: 10-04-1946 Today's Date: 01/30/2023  History of Present Illness  77 yo male admitted from [facility] 1/31 with hypotension during HD due to septic shock from [facility]. TDC placed 2/5. PMHx:  Pt admitted 12/21-1/30 ( for cholecystectomy which was canceled due to hypokalemia and prolonged Qtc. 12/26 cardiac cath. 12/28 lap chole. +sacral ulcer). ESRD on HD MWF, HFrEF, DM, HTN, anxiety, HLD, Bil TKA, gout, GERD  Clinical Impression  Pt familiar from prior admission with progressive decline during that admission despite therapy involvement to the point of total assist. Pt remains total assist with need for assist with all mobility, transfers and care. Pt does not requires acute services at this time and all further needs can be met in next venue with need for hospital bed and hoyer lift. Pt encouraged to actively move bil UE and LE in bed and assist with positioning to take pressure off sacrum and improve function.        Recommendations for follow up therapy are one component of a multi-disciplinary discharge planning process, led by the attending physician.  Recommendations may be updated based on patient status, additional functional criteria and insurance authorization.  Follow Up Recommendations Long-term institutional care without follow-up therapy Can patient physically be transported by private vehicle: No    Assistance Recommended at Discharge Frequent or constant Supervision/Assistance  Patient can return home with the following  Two people to help with walking and/or transfers;Two people to help with bathing/dressing/bathroom;Assistance with cooking/housework;Assistance with feeding;Assist for transportation;Direct supervision/assist for medications management;Direct supervision/assist for financial management    Equipment Recommendations Hospital bed;Wheelchair (measurements PT);Wheelchair cushion  (measurements PT);Other (comment) (hoyer lift)  Recommendations for Other Services       Functional Status Assessment Patient has not had a recent decline in their functional status     Precautions / Restrictions Precautions Precautions: Fall;Other (comment) Precaution Comments: sacral wound      Mobility  Bed Mobility Overal bed mobility: Needs Assistance Bed Mobility: Rolling, Sidelying to Sit, Sit to Supine Rolling: Max assist Sidelying to sit: Max assist, +2 for physical assistance Supine to sit: Total assist, +2 for safety/equipment, HOB elevated     General bed mobility comments: max assist with multimodal cues for sequence and safety for rolling and side to sit. Pt with limited assist to help with lifting trunk to sitting, unable to maintain midline with cues and bil UE on surface with right lean. PT unable to engage core or bil UE significantly to assist with mobilty and lacks awareness of deficits. Total assist to lift legs to surface. Total +2 to slide toward [facility]    Transfers                   General transfer comment: unable will require lift    Ambulation/Gait                  Stairs            Wheelchair Mobility    Modified Rankin (Stroke Patients Only)       Balance Overall balance assessment: Needs assistance Sitting-balance support: Bilateral upper extremity supported, Feet supported Sitting balance-Leahy Scale: Zero Sitting balance - Comments: max assist for sitting with continued right lean, no righting reactions or awareness of midline Postural control: Right lateral lean  Pertinent Vitals/Pain Pain Assessment Pain Score: 8  Pain Location: sacrum Pain Descriptors / Indicators: Discomfort, Grimacing Pain Intervention(s): Limited activity within patient's tolerance, Repositioned, Monitored during session    [place] expects to be discharged to:: Skilled  nursing facility Living Arrangements: Spouse/significant other Available Help at Discharge: Family;Available PRN/intermittently Type of Home: House Home Access: Stairs to enter Entrance Stairs-Rails: Right;Left;Can reach both Entrance Stairs-Number of Steps: 3   Home Layout: One level Home Equipment: Conservation officer, nature (2 wheels);Cane - single point;Grab bars - tub/shower;BSC/3in1      Prior Function Prior Level of Function : Needs assist             Mobility Comments: Household ambulator using RW prior to Dec admission with progressive decline during admission to total assist ADLs Comments: total assist since Dec admission     Hand Dominance        Extremity/Trunk Assessment   Upper Extremity Assessment Upper Extremity Assessment: RUE deficits/detail;LUE deficits/detail RUE Deficits / Details: limited arthritic hands, shoulder flexion grossly 90degrees with AAROM, very limited effort into lifting arms with grossly 2-/5 strength LUE Deficits / Details: thumb amputation, arthritis shoulder flexion grossly 90degrees with AAROM, very limited effort into lifting arms with grossly 2-/5 strength    Lower Extremity Assessment Lower Extremity Assessment: RLE deficits/detail;LLE deficits/detail RLE Deficits / Details: pt maintaining knee flexion contracture grossly 10 degrees, trace activation of moving bil LE onto and off of bed, no significant active ankle ROM LLE Deficits / Details: pt maintaining knee flexion contracture grossly 10 degrees, trace activation of moving bil LE onto and off of bed, no significant active ankle ROM    Cervical / Trunk Assessment Cervical / Trunk Assessment: Kyphotic  Communication   Communication: No difficulties  Cognition Arousal/Alertness: Awake/alert Behavior During Therapy: Flat affect Overall Cognitive Status: No family/caregiver present to determine baseline cognitive functioning Area of Impairment: Orientation, Memory, Following commands,  Awareness, Problem solving                 Orientation Level: Disoriented to, Time Current Attention Level: Sustained Memory: Decreased short-term memory Following Commands: Follows one step commands inconsistently, Follows one step commands with increased time Safety/Judgement: Decreased awareness of deficits, Decreased awareness of safety   Problem Solving: Slow processing, Difficulty sequencing, Decreased initiation, Requires verbal cues, Requires tactile cues General Comments: pt oriented to place, no awareness of deficits, progressive decline or lack of ability to follow commands for functioning        General Comments      Exercises     Assessment/Plan    PT Assessment All further PT needs can be met in the next venue of care  PT Problem List Decreased strength;Decreased activity tolerance;Decreased balance;Decreased mobility;Decreased knowledge of use of DME;Decreased skin integrity       PT Treatment Interventions      PT Goals (Current goals can be found in the Care Plan section)  Acute Rehab PT Goals PT Goal Formulation: All assessment and education complete, DC therapy    Frequency       Co-evaluation               AM-PAC PT "6 Clicks" Mobility  Outcome Measure Help needed turning from your back to your side while in a flat bed without using bedrails?: Total Help needed moving from lying on your back to sitting on the side of a flat bed without using bedrails?: Total Help needed moving to and from a bed to a chair (including  a wheelchair)?: Total Help needed standing up from a chair using your arms (e.g., wheelchair or bedside chair)?: Total Help needed to walk in hospital room?: Total Help needed climbing 3-5 steps with a railing? : Total 6 Click Score: 6    End of Session   Activity Tolerance: Patient tolerated treatment well Patient left: in bed;with call bell/phone within reach;with bed alarm set Nurse Communication: Mobility status;Need  for lift equipment PT Visit Diagnosis: Unsteadiness on feet (R26.81);Other abnormalities of gait and mobility (R26.89);Muscle weakness (generalized) (M62.81);Difficulty in walking, not elsewhere classified (R26.2)    Time: 4401-0272 PT Time Calculation (min) (ACUTE ONLY): 15 min   Charges:   PT Evaluation $PT Eval Moderate Complexity: 1 Mod          Caroline Matters P, PT Acute Rehabilitation Services Office: [phone]   Lamarr Lulas 01/30/2023, 9:58 AM

## 2023-01-30 NOTE — Progress Notes (Signed)
Patient ID: WOODROW DRAB, male   DOB: 1946/02/04, 76 y.o.   MRN: [id number]    Progress Note from the Palliative Medicine Team at [facility]   Patient Name: [name]        Date: 01/30/2023 DOB: July 23, 1946  Age: 77 y.o. MRN#: [id number] Attending Physician: Edwin Dada, * Primary Care Physician: Redmond School, MD Admit Date: 01/22/2023   Medical records reviewed   77 y.o. male with a history of recent failure to thrive, ESRD (HD MWF), multivessel CAD, T2DM, HFrEF, PAFprolonged admission, bedbound with large tunneling sacral decubitus ulcer who was discharged to SNF 1/30 only to return 1/31 with septic shock due to E. coli bacteremia, Morganella also growing on cultures, coccygeal osteomyelitis causing metabolic encephalopathy. Antibiotics, guided by ID, have been given with improvement, liberation from pressors. HD catheter removed pending blood culture clearance. If repeat cultures negative on 2/5, will replace catheter in IR and restart routine HD.   HD cath replaced  Multiple SNF stays over the past several months, continued physical, functional and cognitive decline over the past six months per family     This NP assessed patient at the bedside as a follow up for palliative medicine needs and emotional support.  Niece/  Lajita  at bedside    Mr. Wieman refused to talk to me today.  I spoke briefly with niece regarding patient's current medical situation and the  important/hard  pending decisions specific to treatment option decision, advanced directive decisions and anticipatory care.  She verbalizes that her uncle "has always been this way", his brother is next of kin.  There apparently is an estranged daughter, ever patient's brother [name] is main contact.  Questions and concerns addressed.   Plan of Care: -Full Code  -Patient is open to all offered and available medical interventions to prolong life   I spoke to transition care team to let them know I  will be out of the hospital until early next week, I will follow-up with Mr. Kellie Simmering at that time   4 minutes   Wadie Lessen NP  Palliative Medicine Team Team Phone # [phone] Pager [phone]

## 2023-01-31 DIAGNOSIS — M4628 Osteomyelitis of vertebra, sacral and sacrococcygeal region: Secondary | ICD-10-CM | POA: Diagnosis not present

## 2023-01-31 DIAGNOSIS — G9341 Metabolic encephalopathy: Secondary | ICD-10-CM | POA: Diagnosis not present

## 2023-01-31 DIAGNOSIS — A419 Sepsis, unspecified organism: Secondary | ICD-10-CM | POA: Diagnosis not present

## 2023-01-31 DIAGNOSIS — E1169 Type 2 diabetes mellitus with other specified complication: Secondary | ICD-10-CM | POA: Diagnosis not present

## 2023-01-31 LAB — CBC
HCT: 28.6 % — ABNORMAL LOW (ref 39.0–52.0)
Hemoglobin: 9.1 g/dL — ABNORMAL LOW (ref 13.0–17.0)
MCH: 31.9 pg (ref 26.0–34.0)
MCHC: 31.8 g/dL (ref 30.0–36.0)
MCV: 100.4 fL — ABNORMAL HIGH (ref 80.0–100.0)
Platelets: 440 10*3/uL — ABNORMAL HIGH (ref 150–400)
RBC: 2.85 MIL/uL — ABNORMAL LOW (ref 4.22–5.81)
RDW: 18.7 % — ABNORMAL HIGH (ref 11.5–15.5)
WBC: 17.3 10*3/uL — ABNORMAL HIGH (ref 4.0–10.5)
nRBC: 0 % (ref 0.0–0.2)

## 2023-01-31 LAB — RENAL FUNCTION PANEL
Albumin: 1.7 g/dL — ABNORMAL LOW (ref 3.5–5.0)
Anion gap: 15 (ref 5–15)
BUN: 61 mg/dL — ABNORMAL HIGH (ref 8–23)
CO2: 22 mmol/L (ref 22–32)
Calcium: 9.2 mg/dL (ref 8.9–10.3)
Chloride: 96 mmol/L — ABNORMAL LOW (ref 98–111)
Creatinine, Ser: 3.45 mg/dL — ABNORMAL HIGH (ref 0.61–1.24)
GFR, Estimated: 18 mL/min — ABNORMAL LOW (ref >60)
Glucose, Bld: 107 mg/dL — ABNORMAL HIGH (ref 70–99)
Phosphorus: 2.7 mg/dL (ref 2.5–4.6)
Potassium: 4 mmol/L (ref 3.5–5.1)
Sodium: 133 mmol/L — ABNORMAL LOW (ref 135–145)

## 2023-01-31 MED ORDER — HEPARIN SODIUM (PORCINE) 1000 UNIT/ML DIALYSIS
4000.0000 [IU] | Freq: Once | INTRAMUSCULAR | Status: AC
  Administered 2023-01-31: 4000 [IU] via INTRAVENOUS_CENTRAL
  Filled 2023-01-31: qty 4

## 2023-01-31 MED ORDER — HEPARIN SODIUM (PORCINE) 1000 UNIT/ML DIALYSIS
2000.0000 [IU] | INTRAMUSCULAR | Status: DC | PRN
  Filled 2023-01-31: qty 2

## 2023-01-31 MED ORDER — ALTEPLASE 2 MG IJ SOLR
INTRAMUSCULAR | Status: AC
  Administered 2023-01-31: 2 mg
  Filled 2023-01-31: qty 4

## 2023-01-31 MED ORDER — ALTEPLASE 2 MG IJ SOLR
2.0000 mg | Freq: Once | INTRAMUSCULAR | Status: AC | PRN
  Administered 2023-01-31: 2 mg
  Filled 2023-01-31: qty 2

## 2023-01-31 NOTE — Progress Notes (Signed)
Physical Therapy Wound Treatment Patient Details  Name: JARQUES VILORIO MRN: [id number] Date of Birth: [birth year]  Today's Date: 01/31/2023 Time: 0810-0905 Time Calculation (min): 55 min  Subjective  Subjective Assessment Subjective: pt feeling better and agreeable to therapy Patient and Family Stated Goals: have less pain Date of Onset:  (pt states about 4 months ago) Prior Treatments: hydro therapy  Pain Score:  Pt received oral pain medication prior to treatment session. Pt reporting 6/10 pain with treatment.  Wound Assessment  Pressure Injury 01/23/23 Sacrum Mid Unstageable - Full thickness tissue loss in which the base of the injury is covered by slough (yellow, tan, gray, green or brown) and/or eschar (tan, brown or black) in the wound bed. (Active)  Wound Image   01/31/23 1400  Dressing Type Gauze (Comment);Honey;Barrier Film (skin prep);Foam - Lift dressing to assess site every shift 01/31/23 1400  Dressing Changed 01/31/23 1400  Dressing Change Frequency Daily 01/31/23 1400  State of Healing Non-healing 01/31/23 1400  Site / Wound Assessment Black;Brown;Yellow;Painful;Pink 01/31/23 1400  % Wound base Red or Granulating 65% 12/31/22 0400  % Wound base Yellow/Fibrinous Exudate 10% 01/31/23 1400  % Wound base Black/Eschar 85% 01/31/23 1400  % Wound base Other/Granulation Tissue (Comment) 5% 01/31/23 1400  Peri-wound Assessment Black;Other (Comment);Maceration;Pink;Denuded 01/31/23 1400  Wound Length (cm) 8.2 cm 01/31/23 1400  Wound Width (cm) 6.3 cm 01/31/23 1400  Wound Depth (cm) 3.5 cm 01/31/23 1400  Wound Surface Area (cm^2) 51.66 cm^2 01/31/23 1400  Wound Volume (cm^3) 180.81 cm^3 01/31/23 1400  Tunneling (cm) yes 01/29/23 1815  Undermining (cm) entirity of the wound 12 oclock 3.2, 3 oclock 1.8, 6 oclock 2.2, 9 o'clock 2.4 01/31/23 1400  Margins Unattached edges (unapproximated) 01/31/23 1400  Drainage Amount Moderate 01/31/23 1400  Drainage Description  Serosanguineous;Odor - foul 01/31/23 1400  Treatment Irrigation;Debridement (Selective) 01/31/23 1400   Selective Debridement (non-excisional) Selective Debridement (non-excisional) - Location: Sacrum Selective Debridement (non-excisional) - Tools Used: Forceps, Scalpel, Scissors Selective Debridement (non-excisional) - Tissue Removed: gray black fibrin necrotic, tissue, from base of the wound and hardened black and yellow eschar around margin of the wound    Wound Assessment and Plan  Wound Therapy - Assess/Plan/Recommendations Wound Therapy - Clinical Statement: Pt agreeable to wound care, reports he wants everything done that is possible. PT counciled pt that healing the wound is going to be very challenging due to how sick he is, but that PT will continue to work on cleaning out necrotic tissue. Wound Therapy - Functional Problem List: decreased mobility Factors Delaying/Impairing Wound Healing: Diabetes Mellitus, Infection - systemic/local, Multiple medical problems, Immobility, Incontinence Hydrotherapy Plan: Debridement, Dressing change, Patient/family education Wound Therapy - Frequency:  (2x/wk) Wound Therapy - Current Recommendations: WOC nurse Wound Therapy - Follow Up Recommendations: dressing changes by RN  Wound Therapy Goals- Improve the function of patient's integumentary system by progressing the wound(s) through the phases of wound healing (inflammation - proliferation - remodeling) by: Wound Therapy Goals - Improve the function of patient's integumentary system by progressing the wound(s) through the phases of wound healing by: Decrease Necrotic Tissue to: 80 Decrease Necrotic Tissue - Progress: Not progressing Increase Granulation Tissue to: 10 Increase Granulation Tissue - Progress: Mot progressing Additional Wound Therapy Goal - Progress: Not progressing Goals/treatment plan/discharge plan were made with and agreed upon by patient/family: Yes Time For Goal  Achievement: 7 days Wound Therapy - Potential for Goals: Poor  Goals will be updated until maximal potential achieved or discharge criteria met.  Discharge criteria:  when goals achieved, discharge from hospital, MD decision/surgical intervention, no progress towards goals, refusal/missing three consecutive treatments without notification or medical reason.  GP     Charges PT Wound Care Charges $Wound Debridement up to 20 cm: < or equal to 20 cm $ Wound Debridement each add'l 20 sqcm: 3     Jazzmyn Filion B. Migdalia Dk PT, DPT Acute Rehabilitation Services Please use secure chat or  Call Office [phone]   [place] 01/31/2023, 2:34 PM

## 2023-01-31 NOTE — Progress Notes (Signed)
Pt has orders to receive HD today in the chair. Will await outcome as to whether pt is able to tolerate HD in the chair or not due to wound. Will assist as needed.   Melven Sartorius Renal Navigator [phone]

## 2023-01-31 NOTE — Plan of Care (Signed)

## 2023-01-31 NOTE — Progress Notes (Signed)
[name]  Subjective: Seen in room. No c/o's.   Physical Exam: General: Elderly looking frail male lying on bed in ICU. Heart:RRR, s1s2 nl Lungs:clear b/l, no crackle Abdomen:soft, Non-tender, non-distended Extremities:No edema Dialysis Access: RIJ TDC in place   Dialysis Orders: [facility] MWF 4h   400/800   83kg  3K/2.25Ca bath  R TDC   Heparin 4000 units - Full 4hr HD 1/31, was below new EDW - left 78.6kg - Mircera 225 mcg IV q 2 weeks (due 2/2, last given Aranesp 284mg on 1/26) - no VDRA  Summary Pt is a 77y.o. yo male with ESRD on HD, CHF, DM, A-fib, CAD, mostly bedbound with physical deconditioning known large sacral decubitus ulcer, recently discharged from hospital on 1/30 now presented back with septic shock.   Assessment/ Plan # Septic shock /  E. coli bacteremia / coccygeal osteomyelitis / sacral decub - off of pressors, on Cefepime/ flagyl IV.  Seen by general surgery recommended no acute surgical needs.  HD catheter was removed on 2/1 for line holiday and replaced by IR on 2/05. Blood culture repeated 2/2 was negative.   # ESRD - MWF HD. HD today on schedule.   # Hypotension/volume - on midodrine w/ BP's low to low-normal. Weights are off. No edema on exam. Is on room air.   # Secondary hyperparathyroidism: Calcium and phosphorus level acceptable.  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  # Sacral decub - tunneled wound  # Goals of care: Patient with multiple admission, stage IV sacral decubiti, physical deconditioning.  Palliative care team met w/ patient and family on 2/05. Pt did not want to discuss life/ death issues. Full code. Will try to get pt up in chair for HD.   RKelly Splinter MD 01/31/2023, 11:11 AM  Recent Labs  Lab 01/29/23 0826 01/30/23 0342 01/31/23 0409  HGB 8.8* 9.7*  --   ALBUMIN 1.7* 1.7* 1.7*  CALCIUM 9.0 9.0 9.2  PHOS 3.3 2.7 2.7  CREATININE  3.29* 2.67* 3.45*  K 3.5 4.4 4.0     Inpatient medications:  (feeding supplement) PROSource Plus  30 mL Oral BID BM   acetaminophen  1,000 mg Oral Q8H   aspirin EC  81 mg Oral Daily   atorvastatin  40 mg Oral Daily   Chlorhexidine Gluconate Cloth  6 each Topical Q0600   Chlorhexidine Gluconate Cloth  6 each Topical Q0600   darbepoetin (ARANESP) injection - DIALYSIS  100 mcg Subcutaneous Q Fri-1800   feeding supplement  237 mL Oral TID BM   folic acid  1 mg Oral Daily   gabapentin  100 mg Oral TID   heparin  4,000 Units Dialysis Once in dialysis   heparin  5,000 Units Subcutaneous Q8H   isosorbide dinitrate  5 mg Oral BID   leptospermum manuka honey  1 Application Topical Daily   metroNIDAZOLE  500 mg Oral Q12H   midodrine  10 mg Oral Q M,W,F-HD   midodrine  10 mg Oral Q Sat-HD   multivitamin  1 tablet Oral QHS   pantoprazole  40 mg Oral Daily   sodium hypochlorite   Irrigation BID    sodium chloride Stopped (01/24/23 2146)   ceFEPime (MAXIPIME) IV 1 g (01/30/23 2050)   sodium chloride, [START ON 02/01/2023] heparin, oxyCODONE

## 2023-01-31 NOTE — Progress Notes (Signed)
78: MD messaged for correct wound care orders. Per chart and notes, there are multiple different suggestions for appropriate wound care. Per Claria Dice MD, continue with ordered wound care and have day team address tomorrow for appropriate orders.   0126: Patient bladder scan showing 235 ML. [place] notified  0230: Patient In and out cath per order. 350 ML of amber clear urine obtained.

## 2023-01-31 NOTE — Progress Notes (Signed)
   01/31/23 1500  During Treatment Monitoring  Intra-Hemodialysis Comments  ([place] run Dr. Jonnie Finner notified cathflo place in each lumen 1522. Pt will come back later.)

## 2023-01-31 NOTE — TOC Progression Note (Signed)
Transition of Care [facility]) - Progression Note    Patient Details  Name: [name] MRN: [id number] Date of Birth: [birth year]  Transition of Care [facility]) CM/SW [place], RN Phone Number: 01/31/2023, 4:40 PM  Clinical Narrative:    CM spoke with [facility], Renal Navigator and patient tolerated HD session today in the chair.  The patient will need continued wound care to large sacral wound.  HYdrotherapy can likely be transitioned to ordered wound care for patient' return to care at the SNF.  I am hopeful that patient will have medical needs for return to [facility] for wound care management once hydrotherapy has been discontinued.  The patient is agreeable for SNF placement.  Insurance authorization will need to be authorized for patient's return to the facility.   Expected Discharge Plan: [place] Barriers to Discharge: Continued Medical Work up  Expected Discharge Plan and Services   Discharge Planning Services: CM Consult Post Acute Care Choice: [place] (SNF versus LTAC) Living arrangements for the past 2 months: Single Family Home                                       Social Determinants of Health (SDOH) Interventions SDOH Screenings   Food Insecurity: No Food Insecurity (01/05/2023)  Housing: Low Risk  (12/17/2022)  Transportation Needs: No Transportation Needs (12/17/2022)  Utilities: Not At Risk (12/17/2022)  Tobacco Use: Medium Risk (01/28/2023)    Readmission Risk Interventions    01/30/2023    4:23 PM 01/20/2023   10:26 AM 09/16/2022   12:31 PM  Readmission Risk Prevention Plan  Transportation Screening Complete Complete Complete  Medication Review Press photographer) Complete Complete Complete  PCP or Specialist appointment within 3-5 days of discharge Complete Complete   HRI or Home Care Consult  Complete Complete  SW Recovery Care/Counseling Consult Complete Complete Complete  Palliative  Care Screening Complete Complete Not Applicable  [place] Complete Complete Not Applicable

## 2023-01-31 NOTE — Progress Notes (Signed)
14:25 cahtflow was removed from lumens. Catheter Arterial lumen had trouble pulling and pushing. Venous lumen had trouble pushing and pulling as well. Cathflo 1.9 in each lumen was placed to dwell overnight per Dr. Jonnie Finner, tx will resume tomorrow.

## 2023-01-31 NOTE — Progress Notes (Signed)
  Progress Note   Patient: [name] [id number] DOB: [birth year] DOA: 01/22/2023     9 DOS: the patient was seen and examined on 01/31/2023 at 10:27 AM      Brief hospital course: Mr. Yehle is a 77 y.o. M with ESRD on HD MWF and chronic sacral wound who presented with sepsis.  Please see summary from 2/8         Assessment and Plan: * Septic shock due to E coli and Morganella bacteremia due to coccygeal osteomyelitis due to sacral ulcer - Continue cefepime - Continue midodrine  Chronic osteomyelitis of coccyx (HCC) Seen by ID and General Surgery.  No debridement would be helpful.  ID note that coccygeal osteo is not curable and patient is high risk for recurrent infection and readmission for this.  He has completed 7 days Flagyl and is on day 8 of 14 of Cefepime EOT 2/15 - Continue cefepime - Follow up with ID after discharge - Continue oxycodone for pain - Dakins and medihoney on non-hydro days  We will continue hydrotherapy while inpatient, although per PT, the benefit appears to be low, and we will plan to stop hydrotherapy at discharge from acute inpatient care  Diabetes mellitus type 2 in obese (HCC) Glucose well controlled - Continue aspirin and gabapentin and atorvastatin  Acute metabolic encephalopathy Resolved  Malnutrition of moderate degree As evidenced by loss of subcutaneous muscle mass and fat, weight loss >10 kg in last 8 months  End stage renal disease on dialysis [facility] - Consult nephrology for HD  Iron deficiency anemia - Continue Aranesp, folate          Subjective: Patient very weak, no new fever, confusion, respiratory symptoms dialysis today, will try to set up in the chair     Physical Exam: BP 125/77 (BP Location: Right Arm)   Pulse (!) 43   Temp 97.7 F (36.5 C) (Oral)   Resp 16   Ht '6\' 3"'$  (1.905 m)   Wt 96.6 kg   SpO2 100%   BMI 26.62 kg/m   Elderly adult male, lying in bed, no acute distress, watching TV RRR,  no murmurs, no peripheral edema Respiratory rate normal, lungs clear without rales or wheezes Abdomen soft without grimace to palpation Severe generalized weakness, symmetric strength, face symmetric, speech fluent, attention normal    Data Reviewed: Metabolic panel shows hyponatremia, normal creatinine, no change from previous  Family Communication: None present    Disposition: Status is: Inpatient Patient is medically stabilized, and has 1 more week of IV antibiotics.  His prior to discharge is that he will need to demonstrate that he can set up for dialysis, otherwise he may need to transition to [place].        Author: Edwin Dada, MD 01/31/2023 3:21 PM  For on call review [email or web address].

## 2023-02-01 DIAGNOSIS — M4628 Osteomyelitis of vertebra, sacral and sacrococcygeal region: Secondary | ICD-10-CM | POA: Diagnosis not present

## 2023-02-01 DIAGNOSIS — G9341 Metabolic encephalopathy: Secondary | ICD-10-CM | POA: Diagnosis not present

## 2023-02-01 DIAGNOSIS — A419 Sepsis, unspecified organism: Secondary | ICD-10-CM | POA: Diagnosis not present

## 2023-02-01 DIAGNOSIS — E1169 Type 2 diabetes mellitus with other specified complication: Secondary | ICD-10-CM | POA: Diagnosis not present

## 2023-02-01 LAB — RENAL FUNCTION PANEL
Albumin: 1.6 g/dL — ABNORMAL LOW (ref 3.5–5.0)
Anion gap: 13 (ref 5–15)
BUN: 67 mg/dL — ABNORMAL HIGH (ref 8–23)
CO2: 23 mmol/L (ref 22–32)
Calcium: 9.2 mg/dL (ref 8.9–10.3)
Chloride: 98 mmol/L (ref 98–111)
Creatinine, Ser: 3.93 mg/dL — ABNORMAL HIGH (ref 0.61–1.24)
GFR, Estimated: 15 mL/min — ABNORMAL LOW (ref >60)
Glucose, Bld: 85 mg/dL (ref 70–99)
Phosphorus: 3.3 mg/dL (ref 2.5–4.6)
Potassium: 4.1 mmol/L (ref 3.5–5.1)
Sodium: 134 mmol/L — ABNORMAL LOW (ref 135–145)

## 2023-02-01 LAB — GLUCOSE, CAPILLARY
Glucose-Capillary: 90 mg/dL (ref 70–99)
Glucose-Capillary: 93 mg/dL (ref 70–99)
Glucose-Capillary: 120 mg/dL — ABNORMAL HIGH (ref 70–99)

## 2023-02-01 LAB — CBC
HCT: 24.9 % — ABNORMAL LOW (ref 39.0–52.0)
Hemoglobin: 8.2 g/dL — ABNORMAL LOW (ref 13.0–17.0)
MCH: 32.3 pg (ref 26.0–34.0)
MCHC: 32.9 g/dL (ref 30.0–36.0)
MCV: 98 fL (ref 80.0–100.0)
Platelets: 362 10*3/uL (ref 150–400)
RBC: 2.54 MIL/uL — ABNORMAL LOW (ref 4.22–5.81)
RDW: 18.9 % — ABNORMAL HIGH (ref 11.5–15.5)
WBC: 14.2 10*3/uL — ABNORMAL HIGH (ref 4.0–10.5)
nRBC: 0 % (ref 0.0–0.2)

## 2023-02-01 MED ORDER — HEPARIN SODIUM (PORCINE) 1000 UNIT/ML IJ SOLN
2000.0000 [IU] | Freq: Once | INTRAMUSCULAR | Status: AC
  Administered 2023-02-01: 2000 [IU] via INTRAVENOUS

## 2023-02-01 MED ORDER — HEPARIN SODIUM (PORCINE) 1000 UNIT/ML IJ SOLN: 3800.0000 [IU] | Freq: Once | INTRAMUSCULAR | Status: DC

## 2023-02-01 MED ORDER — HEPARIN SODIUM (PORCINE) 1000 UNIT/ML IJ SOLN
INTRAMUSCULAR | Status: AC
  Filled 2023-02-01: qty 6

## 2023-02-01 MED ORDER — HEPARIN SODIUM (PORCINE) 1000 UNIT/ML IJ SOLN
4000.0000 [IU] | Freq: Once | INTRAMUSCULAR | Status: AC
  Administered 2023-02-01: 4000 [IU] via INTRAVENOUS

## 2023-02-01 NOTE — Progress Notes (Signed)
2147: Patient bladder scanned showing between 168-199 ML. MD Crosley notified of no urine output charted on day shift and current bladder scan volume.  2224: No new orders received. Patient states he doesn't feel like he has to void. Primafit continues to be in place in case of void.

## 2023-02-01 NOTE — Progress Notes (Signed)
[facility] NEPHROLOGY PROGRESS NOTE  Subjective: Seen on HD. No c/o's.   Physical Exam: General: Elderly looking frail male lying on bed in ICU. Heart:RRR, s1s2 nl Lungs:clear b/l, no crackle Abdomen:soft, Non-tender, non-distended Extremities:No edema Dialysis Access: RIJ TDC in place   Dialysis Orders: [facility] MWF 4h   400/800   83kg  3K/2.25Ca bath  R TDC   Heparin 4000 units - Full 4hr HD 1/31, was below new EDW - left 78.6kg - Mircera 225 mcg IV q 2 weeks (due 2/2, last given Aranesp 276mg on 1/26) - no VDRA  Summary Pt is a 77y.o. yo male with ESRD on HD, CHF, DM, A-fib, CAD, mostly bedbound with physical deconditioning known large sacral decubitus ulcer, recently discharged from hospital on 1/30 now presented back with septic shock.   Assessment/ Plan # Septic shock /  E. coli bacteremia / coccygeal osteomyelitis / sacral decub - off of pressors, on Cefepime/ flagyl IV.  Seen by general surgery recommended no acute surgical needs.  HD catheter was removed on 2/1 for line holiday and replaced by IR on 2/05. Blood culture repeated 2/2 was negative.   # ESRD - MWF HD. HD today on schedule. [facility] but after TPA of the catheter, tonight he is running very good w/ BFR of 350 cc/hr.  HD today off schedule due to cath malfunction yesterday.   # Hypotension/volume - on midodrine w/ BP's low to low-normal. Weights are off. No edema on exam. Is on room air.   # Secondary hyperparathyroidism: Calcium and phosphorus level acceptable.  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  # Sacral decub - tunneled wound  # Goals of care: Patient with multiple admission, stage IV sacral decubiti, physical deconditioning.  Palliative care team met w/ patient and family on 2/05. Pt did not want to discuss life/ death issues. Full code. Will try to get pt up in chair for HD.   RKelly Splinter MD 02/01/2023, 2:49  PM  Recent Labs  Lab 01/31/23 0409 01/31/23 1435 02/01/23 0226  HGB  --  9.1* 8.2*  ALBUMIN 1.7*  --  1.6*  CALCIUM 9.2  --  9.2  PHOS 2.7  --  3.3  CREATININE 3.45*  --  3.93*  K 4.0  --  4.1     Inpatient medications:  (feeding supplement) PROSource Plus  30 mL Oral BID BM   acetaminophen  1,000 mg Oral Q8H   aspirin EC  81 mg Oral Daily   atorvastatin  40 mg Oral Daily   Chlorhexidine Gluconate Cloth  6 each Topical Q0600   Chlorhexidine Gluconate Cloth  6 each Topical Q0600   darbepoetin (ARANESP) injection - DIALYSIS  100 mcg Subcutaneous Q Fri-1800   feeding supplement  237 mL Oral TID BM   folic acid  1 mg Oral Daily   gabapentin  100 mg Oral TID   heparin  5,000 Units Subcutaneous Q8H   isosorbide dinitrate  5 mg Oral BID   leptospermum manuka honey  1 Application Topical Daily   midodrine  10 mg Oral Q M,W,F-HD   multivitamin  1 tablet Oral QHS   pantoprazole  40 mg Oral Daily   sodium hypochlorite   Irrigation BID    sodium chloride Stopped (01/24/23 2146)   ceFEPime (MAXIPIME) IV 1 g (01/31/23 2012)   sodium chloride, oxyCODONE

## 2023-02-01 NOTE — Progress Notes (Signed)
  Progress Note   Patient: [name] [id number]:[id number] DOB: 1946-11-13 DOA: 01/22/2023     10 DOS: the patient was seen and examined on 02/01/2023 at 11:01AM      Brief hospital course: [name] is a 77 y.o. M with ESRD on HD MWF, CAD, DM, sCHF previous EF 30-35% now recovered, recent episode of chronic cholecystitis and then prolonged hospitalization last month for NSTEMI complicated by infected sacral decubitus ulcer who was brought from dialysis due to hypotension.  See prior summary         Assessment and Plan: * Septic shock due to E coli and Morganella bacteremia due to coccygeal osteomyelitis due to sacral ulcer See below  Chronic osteomyelitis of coccyx ([place]) Seen by ID and General Surgery.  No debridement would be helpful.  ID note that coccygeal osteo is not curable and patient is high risk for recurrent infection and readmission for this.  He has completed 7 days Flagyl and is on day 9 of 14 of Cefepime EOT 2/15 - Continue cefepime - Continue midodrine - Follow up with ID after discharge - Continue oxycodone for pain - Dakins and medihoney on non-hydro days  We will continue hydrotherapy while inpatient, although per PT, the benefit appears to be low, and we will plan to stop hydrotherapy at discharge from acute inpatient care  Diabetes mellitus type 2 in obese (HCC) Glucose well controlled off insulin - Continue aspirin and gabapentin and atorvastatin  Acute metabolic encephalopathy Resolved  Malnutrition of moderate degree As evidenced by loss of subcutaneous muscle mass and fat, weight loss >10 kg in last 8 months  End stage renal disease on dialysis [facility]) - Consult nephrology for HD  Iron deficiency anemia Hgb stable - Continue Aranesp, folate          Subjective: No new complaints     Physical Exam: BP 131/61 (BP Location: Left Arm)   Pulse 84   Temp 97.7 F (36.5 C) (Oral)   Resp 15   Ht '6\' 3"'$  (1.905 m)   Wt 97.5 kg   SpO2  100%   BMI 26.87 kg/m   Elderly adult male, lying in bed, watching television RRR, no murmurs Respiratory rate normal, lungs clear without rales or wheezes Responds to questions, no new focal neurological deficits  Data Reviewed: No new labs  Family Communication:     Disposition: Status is: Inpatient Patient is medically stabilized, and has 1 more week of IV antibiotics.   His prior to discharge is that he will need to demonstrate that he can set up for dialysis, otherwise he may need to transition to [place].          Author: Edwin Dada, MD 02/01/2023 1:27 PM  For on call review [email or web address].

## 2023-02-01 NOTE — Progress Notes (Signed)
Patient is alert and oriented. Took all his schedule medications without problem. Ate more than 3/4 of his lunch. This pt verbalized pain to his sacral area rated between 7-8/10. Received prn pain medications, effective per pt. Approached twice about wound care this afternoon and he refused. Jen from dialysis called and informed , instructed this nurse to give patient's midodrine before dialysis and administered. And that score is on the way. Made patient aware and family that are present at the bedside.

## 2023-02-01 NOTE — Progress Notes (Signed)
   Palliative Medicine Inpatient Follow Up Note   Chart review complete. WBC appears to be down-trending.   TOC planning for SNF placement at [facility].  No new Palliative needs.    No Charge ______________________________________________________________________________________ [place] Team Team Cell Phone: [phone] Please utilize secure chat with additional questions, if there is no response within 30 minutes please call the above phone number  Palliative Medicine Team providers are available by phone from 7am to 7pm daily and can be reached through the team cell phone.  Should this patient require assistance outside of these hours, please call the patient's attending physician.

## 2023-02-02 DIAGNOSIS — A419 Sepsis, unspecified organism: Secondary | ICD-10-CM | POA: Diagnosis not present

## 2023-02-02 DIAGNOSIS — E1169 Type 2 diabetes mellitus with other specified complication: Secondary | ICD-10-CM | POA: Diagnosis not present

## 2023-02-02 DIAGNOSIS — G9341 Metabolic encephalopathy: Secondary | ICD-10-CM | POA: Diagnosis not present

## 2023-02-02 DIAGNOSIS — M4628 Osteomyelitis of vertebra, sacral and sacrococcygeal region: Secondary | ICD-10-CM | POA: Diagnosis not present

## 2023-02-02 LAB — RENAL FUNCTION PANEL
Albumin: 1.7 g/dL — ABNORMAL LOW (ref 3.5–5.0)
Anion gap: 12 (ref 5–15)
BUN: 43 mg/dL — ABNORMAL HIGH (ref 8–23)
CO2: 25 mmol/L (ref 22–32)
Calcium: 9.2 mg/dL (ref 8.9–10.3)
Chloride: 96 mmol/L — ABNORMAL LOW (ref 98–111)
Creatinine, Ser: 2.86 mg/dL — ABNORMAL HIGH (ref 0.61–1.24)
GFR, Estimated: 22 mL/min — ABNORMAL LOW (ref >60)
Glucose, Bld: 94 mg/dL (ref 70–99)
Phosphorus: 3.4 mg/dL (ref 2.5–4.6)
Potassium: 3.4 mmol/L — ABNORMAL LOW (ref 3.5–5.1)
Sodium: 133 mmol/L — ABNORMAL LOW (ref 135–145)

## 2023-02-02 LAB — GLUCOSE, CAPILLARY
Glucose-Capillary: 88 mg/dL (ref 70–99)
Glucose-Capillary: 93 mg/dL (ref 70–99)
Glucose-Capillary: 90 mg/dL (ref 70–99)

## 2023-02-02 MED ORDER — CHLORHEXIDINE GLUCONATE CLOTH 2 % EX PADS
6.0000 | MEDICATED_PAD | Freq: Every day | CUTANEOUS | Status: DC
  Administered 2023-02-03 – 2023-02-08 (×6): 6 via TOPICAL

## 2023-02-02 NOTE — Progress Notes (Signed)
Patient approached around 1000 for wound care/dressing change and he refused. Pain medicine administered.  At 1650 wound care provided and new dressing applied.

## 2023-02-02 NOTE — Progress Notes (Signed)
Progress Note   Patient: [name] [id number] DOB: 10-20-1946 DOA: 01/22/2023     11 DOS: the patient was seen and examined on 02/02/2023        Brief hospital course: Mr. Hoene is a 77 y.o. M with ESRD on HD MWF, CAD, DM, sCHF previous EF 30-35% now recovered, recent episode of chronic cholecystitis and then prolonged hospitalization last month for NSTEMI complicated by infected sacral decubitus ulcer who was brought from dialysis due to hypotension.    8/6-8/19/23: Admitted for fluid overload, started HD, discharged to SNF  8/28-08/23/22: Readmitted to [facility] for epigastric pain, found to have cholecystitis, IR placed cholecystostomy tube  9/22-9/25/23: Readmitted to [facility] for hypotension, hypoglycemia  09/24/22: Sacral pressure injury noted by General Surgery, already had been discharged from rehab and patient reporting to Gen Surg "He is not able to walk and he says he wants to go back to a rehab to get stronger."  12/12/22-01/21/23: Presented for elective cholecystectomy, found during preop eval to have diffuse ECG changes, transferred to [facility] for left heart cath; able to have lap chole on 12/28, then hospitalization then prolonged by infection of sacral decubitus ulcer, now stage IV, requiring IV antibiotics    01/22/23: Found to be hypotensive during first HD session post-discharge, sent back to hospital and admitted to ICU on pressors 2/1: Blood cultures growing E coli and Morganella; ID and Nephrology consulted; tunneled line removed; CT abdomen shows sacral ulcer with coccygeal osteomyelitis 2/5: Tunneled line replaced 2/6-present: Awaiting placement        Assessment and Plan: * Septic shock due to E coli and Morganella bacteremia due to coccygeal osteomyelitis due to sacral ulcer See below  Chronic osteomyelitis of coccyx ([place]) Seen by ID and General Surgery.  No debridement would be helpful.  ID note that coccygeal osteo is not curable and patient is high risk for  recurrent infection and readmission for this.  He has completed 7 days Flagyl and is on day 10 of 14 of Cefepime EOT 2/15 - Continue cefepime - Continue midodrine - Follow up with ID after discharge - Continue oxycodone for pain - Dakins and medihoney on non-hydro days  We will continue hydrotherapy while inpatient, although per PT, the benefit appears to be low, and we will plan to stop hydrotherapy at discharge from acute inpatient care    Diabetes mellitus type 2 in obese (HCC) Glucose well controlled off insulin - Continue aspirin and gabapentin and atorvastatin  Acute metabolic encephalopathy Resolved  Malnutrition of moderate degree As evidenced by loss of subcutaneous muscle mass and fat, weight loss >10 kg in last 8 months  End stage renal disease on dialysis [facility]) - Consult nephrology for HD  Iron deficiency anemia Hgb stable - Continue Aranesp, folate          Subjective: No new complaints, patient is in a good mood.     Physical Exam: BP 124/70 (BP Location: Left Arm)   Pulse 81   Temp 97.8 F (36.6 C)   Resp 20   Ht 6' 3"$  (1.905 m)   Wt 97 kg   SpO2 100%   BMI 26.73 kg/m   Older adult male, lying in bed, appears debilitated and malnourished RRR, no murmurs, no peripheral edema Respiratory rate normal, lungs clear without rales or wheezes Abdomen soft TO palpation, attention normal, affect normal, severe generalized weakness is noted    Data Reviewed: Glucose normal Potassium 3.4, sodium 133, creatinine 2.8, albumin 1.7  Disposition: Status is: Inpatient Patient is medically stabilized, and has few more days of IV antibiotics.   His prior to discharge is that he will need to demonstrate that he can set up for dialysis, otherwise he may need to transition to [place].        Author: Edwin Dada, MD 02/02/2023 2:25 PM  For on call review [email or web address].

## 2023-02-02 NOTE — Progress Notes (Signed)
[facility] NEPHROLOGY PROGRESS NOTE  Subjective: Seen on HD. No c/o's.   Physical Exam: General: Elderly looking frail male lying on bed in ICU. Heart:RRR, s1s2 nl Lungs:clear b/l, no crackle Abdomen:soft, Non-tender, non-distended Extremities:No edema Dialysis Access: RIJ TDC in place   Dialysis Orders: [facility] MWF 4h   400/800   83kg  3K/2.25Ca bath  R TDC   Heparin 4000 units - Full 4hr HD 1/31, was below new EDW - left 78.6kg - Mircera 225 mcg IV q 2 weeks (due 2/2, last given Aranesp 244mg on 1/26) - no VDRA  Summary Pt is a 77y.o. yo male with ESRD on HD, CHF, DM, A-fib, CAD, mostly bedbound with physical deconditioning known large sacral decubitus ulcer, recently discharged from hospital on 1/30 now presented back with septic shock.   Assessment/ Plan # Septic shock /  E. coli bacteremia / coccygeal osteomyelitis / sacral decub - off of pressors, on Cefepime/ flagyl IV.  Seen by general surgery recommended no acute surgical needs.  HD catheter was removed on 2/1 for line holiday and replaced by IR on 2/05. Blood culture repeated 2/2 was negative.   # ESRD - MWF HD. [place] but after overnight TPA of HD cath, he ran very well w/ BFR of 350 cc/hr on Sat off schedule.  Next HD tomorrow.   # Hypotension/volume - on midodrine w/ BP's low to low-normal. Weights are off. No edema on exam. Is on room air.   # Secondary hyperparathyroidism: Calcium and phosphorus level acceptable.  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  # Sacral decub - tunneled wound  # Goals of care: Patient with multiple admission, stage IV sacral decubiti, physical deconditioning.  Palliative care team met w/ patient and family on 2/05. Pt did not want to discuss life/ death issues. Full code. Will try to get pt up in chair for HD.   RKelly Splinter MD 02/02/2023, 6:51 PM  Recent Labs  Lab  01/31/23 1435 02/01/23 0226 02/02/23 1134  HGB 9.1* 8.2*  --   ALBUMIN  --  1.6* 1.7*  CALCIUM  --  9.2 9.2  PHOS  --  3.3 3.4  CREATININE  --  3.93* 2.86*  K  --  4.1 3.4*     Inpatient medications:  (feeding supplement) PROSource Plus  30 mL Oral BID BM   acetaminophen  1,000 mg Oral Q8H   aspirin EC  81 mg Oral Daily   atorvastatin  40 mg Oral Daily   Chlorhexidine Gluconate Cloth  6 each Topical Q0600   Chlorhexidine Gluconate Cloth  6 each Topical Q0600   darbepoetin (ARANESP) injection - DIALYSIS  100 mcg Subcutaneous Q Fri-1800   feeding supplement  237 mL Oral TID BM   folic acid  1 mg Oral Daily   gabapentin  100 mg Oral TID   heparin  5,000 Units Subcutaneous Q8H   heparin sodium (porcine)  3,800 Units Intracatheter Once   isosorbide dinitrate  5 mg Oral BID   leptospermum manuka honey  1 Application Topical Daily   midodrine  10 mg Oral Q M,W,F-HD   multivitamin  1 tablet Oral QHS   pantoprazole  40 mg Oral Daily   sodium hypochlorite   Irrigation BID    sodium chloride 450 mL (02/01/23 2055)   ceFEPime (MAXIPIME) IV 1 g (02/01/23 2116)   sodium chloride, oxyCODONE

## 2023-02-03 DIAGNOSIS — R6521 Severe sepsis with septic shock: Secondary | ICD-10-CM | POA: Diagnosis not present

## 2023-02-03 DIAGNOSIS — A419 Sepsis, unspecified organism: Secondary | ICD-10-CM | POA: Diagnosis not present

## 2023-02-03 LAB — GLUCOSE, CAPILLARY
Glucose-Capillary: 87 mg/dL (ref 70–99)
Glucose-Capillary: 124 mg/dL — ABNORMAL HIGH (ref 70–99)
Glucose-Capillary: 96 mg/dL (ref 70–99)

## 2023-02-03 LAB — RENAL FUNCTION PANEL
Albumin: 1.9 g/dL — ABNORMAL LOW (ref 3.5–5.0)
Anion gap: 14 (ref 5–15)
BUN: 64 mg/dL — ABNORMAL HIGH (ref 8–23)
CO2: 23 mmol/L (ref 22–32)
Calcium: 9.8 mg/dL (ref 8.9–10.3)
Chloride: 97 mmol/L — ABNORMAL LOW (ref 98–111)
Creatinine, Ser: 4.24 mg/dL — ABNORMAL HIGH (ref 0.61–1.24)
GFR, Estimated: 14 mL/min — ABNORMAL LOW (ref >60)
Glucose, Bld: 119 mg/dL — ABNORMAL HIGH (ref 70–99)
Phosphorus: 4.2 mg/dL (ref 2.5–4.6)
Potassium: 4 mmol/L (ref 3.5–5.1)
Sodium: 134 mmol/L — ABNORMAL LOW (ref 135–145)

## 2023-02-03 LAB — CBC
HCT: 27.4 % — ABNORMAL LOW (ref 39.0–52.0)
Hemoglobin: 8.5 g/dL — ABNORMAL LOW (ref 13.0–17.0)
MCH: 31.6 pg (ref 26.0–34.0)
MCHC: 31 g/dL (ref 30.0–36.0)
MCV: 101.9 fL — ABNORMAL HIGH (ref 80.0–100.0)
Platelets: 475 10*3/uL — ABNORMAL HIGH (ref 150–400)
RBC: 2.69 MIL/uL — ABNORMAL LOW (ref 4.22–5.81)
RDW: 18.9 % — ABNORMAL HIGH (ref 11.5–15.5)
WBC: 17.4 10*3/uL — ABNORMAL HIGH (ref 4.0–10.5)
nRBC: 0 % (ref 0.0–0.2)

## 2023-02-03 MED ORDER — HEPARIN SODIUM (PORCINE) 1000 UNIT/ML IJ SOLN
INTRAMUSCULAR | Status: AC
  Filled 2023-02-03: qty 3

## 2023-02-03 MED ORDER — OXYCODONE HCL 5 MG PO TABS
5.0000 mg | ORAL_TABLET | ORAL | Status: DC | PRN
  Administered 2023-02-03 – 2023-02-08 (×18): 5 mg via ORAL
  Filled 2023-02-03 (×18): qty 1

## 2023-02-03 MED ORDER — HEPARIN SODIUM (PORCINE) 1000 UNIT/ML IJ SOLN
INTRAMUSCULAR | Status: AC
  Administered 2023-02-03: 4000 [IU]
  Filled 2023-02-03: qty 4

## 2023-02-03 MED ORDER — ALTEPLASE 2 MG IJ SOLR
INTRAMUSCULAR | Status: AC
  Administered 2023-02-03: 4 mg
  Filled 2023-02-03: qty 4

## 2023-02-03 MED ORDER — HEPARIN SODIUM (PORCINE) 1000 UNIT/ML IJ SOLN
INTRAMUSCULAR | Status: AC
  Administered 2023-02-03: 2500 [IU]
  Filled 2023-02-03: qty 4

## 2023-02-03 NOTE — Progress Notes (Signed)
Nutrition Follow-up  DOCUMENTATION CODES:   Non-severe (moderate) malnutrition in context of acute illness/injury  INTERVENTION:  - Liberalize to Regular diet, 1200 mL fluid.   NUTRITION DIAGNOSIS:   Severe Malnutrition related to chronic illness (ESRD on HD) as evidenced by severe fat depletion, severe muscle depletion.  GOAL:   Patient will meet greater than or equal to 90% of their needs  MONITOR:   PO intake, Supplement acceptance, Skin, Labs  REASON FOR ASSESSMENT:   Malnutrition Screening Tool, Diagnosis    ASSESSMENT:   Pt with hx of ESRD on HD, CAD, atrial fibrillation, CHF, DM, GERD, gout, hx MI, HTN, and HLD presented to ED from outpatient HD. Recent long admission and was discharged to SNF on 1/30. Overall picture of FTT  Meds reviewed: lipitor, folic acid, rena-vit. Labs reviewed: K low, Na low.   Pt reports that he has been eating well. He states that he ate all of his eggs and sausage this am. Per record, pt ate 100% of his breakfast tray this am. Pt also reports that he has been drinking all of his supplements. Pt likely meeting needs at this time.   RD modified diet to Regular with 1200 mL fluid restriction to allow pt to have more options. Pt also without BM since 2/2. RD recommends potential bowel regimen.    Diet Order:   Diet Order             Diet regular Room service appropriate? Yes; Fluid consistency: Thin; Fluid restriction: 1200 mL Fluid  Diet effective now                   EDUCATION NEEDS:   Education needs have been addressed  Skin:  Skin Assessment: Reviewed RN Assessment  Last BM:  2/2  Height:   Ht Readings from Last 1 Encounters:  01/22/23 6' 3"$  (1.905 m)    Weight:   Wt Readings from Last 1 Encounters:  02/02/23 97 kg    Ideal Body Weight:  89.1 kg  BMI:  Body mass index is 26.73 kg/m.  Estimated Nutritional Needs:   Kcal:  2000-2200 kcal/d  Protein:  105-120 g/d  Fluid:  UOP + 1 L  Devynne Sturdivant Graciela Husbands,  RD, LDN, CNSC.

## 2023-02-03 NOTE — Progress Notes (Signed)
Patient ID: [name], male   DOB: [birth year], 77 y.o.   MRN: [id number] [facility] Progress Note   Assessment/ Plan:   1.  Septic shock: Secondary to E. 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  Weaned off of pressors with completion of intravenous metronidazole and remains on antimicrobial therapy with intravenous cefepime.  Ongoing care of wound and wants to continue full spectrum of care at this time. 2. ESRD: Continue hemodialysis on a Monday/Wednesday/Friday schedule.  Catheter dysfunction noted on Friday that improved with tPA dwell. 3. Anemia: Secondary to chronic illness and exacerbated by acute disease/hospitalization.  Continue ESA. 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 5. Nutrition: Significant malnutrition noted, likely complicated by sacral decubitus/sepsis.  Continue efforts at protein supplementation and infection/wound management. 6. Hypotension: Off pressors, remains on midodrine to facilitate dialysis.  Subjective:   Without acute events overnight, awakens with difficulty to conversation and voices no complaints.   Objective:   BP 126/70 (BP Location: Left Arm)   Pulse 84   Temp 98.7 F (37.1 C) (Oral)   Resp 18   Ht 6' 3"$  (1.905 m)   Wt 97 kg   SpO2 100%   BMI 26.73 kg/m   Physical Exam: Gen: Cachectic appearing man resting comfortably in bed CVS: Pulse regular rhythm, normal rate, S1 and S2 normal Resp: Clear to auscultation bilaterally, no distinct rales or rhonchi.  Right IJ TDC Abd: Soft, flat, nontender, bowel sounds normal Ext: No lower extremity edema.    Labs: BMET Recent Labs  Lab 01/29/23 0826 01/30/23 0342 01/31/23 0409 02/01/23 0226 02/02/23 1134  NA 134* 134* 133* 134* 133*  K 3.5 4.4 4.0 4.1 3.4*  CL 97* 97* 96* 98 96*  CO2 25 21* 22 23 25  $ GLUCOSE 114* 95 107* 85 94  BUN 48* 40* 61* 67* 43*  CREATININE 3.29* 2.67* 3.45* 3.93* 2.86*   CALCIUM 9.0 9.0 9.2 9.2 9.2  PHOS 3.3 2.7 2.7 3.3 3.4   CBC Recent Labs  Lab 01/29/23 0826 01/30/23 0342 01/31/23 1435 02/01/23 0226  WBC 15.5* 11.8* 17.3* 14.2*  HGB 8.8* 9.7* 9.1* 8.2*  HCT 27.1* 30.4* 28.6* 24.9*  MCV 97.5 97.7 100.4* 98.0  PLT 407* 182 440* 362     Medications:     (feeding supplement) PROSource Plus  30 mL Oral BID BM   acetaminophen  1,000 mg Oral Q8H   aspirin EC  81 mg Oral Daily   atorvastatin  40 mg Oral Daily   Chlorhexidine Gluconate Cloth  6 each Topical Q0600   darbepoetin (ARANESP) injection - DIALYSIS  100 mcg Subcutaneous Q Fri-1800   feeding supplement  237 mL Oral TID BM   folic acid  1 mg Oral Daily   gabapentin  100 mg Oral TID   heparin  5,000 Units Subcutaneous Q8H   heparin sodium (porcine)  3,800 Units Intracatheter Once   isosorbide dinitrate  5 mg Oral BID   leptospermum manuka honey  1 Application Topical Daily   midodrine  10 mg Oral Q M,W,F-HD   multivitamin  1 tablet Oral QHS   pantoprazole  40 mg Oral Daily   sodium hypochlorite   Irrigation BID   Elmarie Shiley, MD 02/03/2023, 7:55 AM

## 2023-02-03 NOTE — Progress Notes (Signed)
Patient arterial pressure were alarming "low" . RN had to turn down blood flow rate down to 200ML. Provider Ouida Sills notified and also advised to provide 2500 ML heparin bolus mid treatment.

## 2023-02-03 NOTE — Progress Notes (Signed)
PROGRESS NOTE  [name] [id number] DOB: Mar 01, 1946 DOA: 01/22/2023 PCP: Redmond School, MD  Brief History   [name] is a 77 y.o. M with ESRD on HD MWF, CAD, DM, sCHF previous EF 30-35% now recovered, recent episode of chronic cholecystitis and then prolonged hospitalization last month for NSTEMI complicated by infected sacral decubitus ulcer who was brought from dialysis due to hypotension.    8/6-8/19/23: Admitted for fluid overload, started HD, discharged to SNF  8/28-08/23/22: Readmitted to [facility] for epigastric pain, found to have cholecystitis, IR placed cholecystostomy tube  9/22-9/25/23: Readmitted to [facility] for hypotension, hypoglycemia  09/24/22: Sacral pressure injury noted by General Surgery, already had been discharged from rehab and patient reporting to Gen Surg "He is not able to walk and he says he wants to go back to a rehab to get stronger."  12/12/22-01/21/23: Presented for elective cholecystectomy, found during preop eval to have diffuse ECG changes, transferred to [facility] for left heart cath; able to have lap chole on 12/28, then hospitalization then prolonged by infection of sacral decubitus ulcer, now stage IV, requiring IV antibiotics    01/22/23: Found to be hypotensive during first HD session post-discharge, sent back to hospital and admitted to ICU on pressors 2/1: Blood cultures growing E coli and Morganella; ID and Nephrology consulted; tunneled line removed; CT abdomen shows sacral ulcer with coccygeal osteomyelitis 2/5: Tunneled line replaced 2/6-present: Awaiting placement 02/03/2023: Patient is complaining of severe pain in backside due to long intervals between oxy dosing. Will increase frequency to q4 hours.  Consultants  Interventional Radiology Palliative Care Infectious Disease Wound Care  Procedures  01/27/2023 Tunnelled HD Catheter placed by IR HD  Antibiotics   Anti-infectives (From admission, onward)    Start     Dose/Rate Route Frequency  Ordered Stop   01/27/23 1621  ceFAZolin (ANCEF) 2-4 GM/100ML-% IVPB       Note to Pharmacy: Leak, [name]: cabinet override      01/27/23 1621 01/27/23 1625   01/27/23 1000  metroNIDAZOLE (FLAGYL) IVPB 500 mg        500 mg 100 mL/hr over 60 Minutes Intravenous  Once 01/27/23 0852 01/27/23 1210   01/27/23 0800  ceFAZolin (ANCEF) IVPB 2g/100 mL premix        2 g 200 mL/hr over 30 Minutes Intravenous To Radiology 01/26/23 1123 01/27/23 0914   01/24/23 2200  metroNIDAZOLE (FLAGYL) tablet 500 mg        500 mg Oral Every 12 hours 01/24/23 1627 01/31/23 1227   01/24/23 2000  ceFEPIme (MAXIPIME) 1 g in sodium chloride 0.9 % 100 mL IVPB        1 g 200 mL/hr over 30 Minutes Intravenous Every 24 hours 01/24/23 1627 02/06/23 2359   01/24/23 1800  vancomycin (VANCOCIN) IVPB 1000 mg/200 mL premix  Status:  Discontinued        1,000 mg 200 mL/hr over 60 Minutes Intravenous Every M-W-F (Hemodialysis) 01/22/23 1820 01/23/23 1057   01/23/23 2000  ceFEPIme (MAXIPIME) 2 g in sodium chloride 0.9 % 100 mL IVPB  Status:  Discontinued        2 g 200 mL/hr over 30 Minutes Intravenous Every 24 hours 01/22/23 1820 01/23/23 0102   01/23/23 0600  piperacillin-tazobactam (ZOSYN) IVPB 2.25 g  Status:  Discontinued        2.25 g 100 mL/hr over 30 Minutes Intravenous Every 8 hours 01/23/23 0119 01/24/23 1627   01/22/23 1815  ceFEPIme (MAXIPIME) 2 g in sodium chloride  0.9 % 100 mL IVPB        2 g 200 mL/hr over 30 Minutes Intravenous  Once 01/22/23 1804 01/22/23 1944   01/22/23 1815  metroNIDAZOLE (FLAGYL) IVPB 500 mg        500 mg 100 mL/hr over 60 Minutes Intravenous  Once 01/22/23 1804 01/22/23 2005   01/22/23 1815  vancomycin (VANCOCIN) IVPB 1000 mg/200 mL premix        1,000 mg 200 mL/hr over 60 Minutes Intravenous  Once 01/22/23 1804 01/22/23 2107        Subjective  Patient complains that interval for dosing oxy is too long.   Objective   Vitals:  Vitals:   02/02/23 1955 02/03/23 0451  BP: (!)  114/58 126/70  Pulse: 86 84  Resp: 20 18  Temp: 98.4 F (36.9 C) 98.7 F (37.1 C)  SpO2: 100% 100%    Exam:  Constitutional:  Appears calm and comfortable Respiratory:  CTA bilaterally, no w/r/r.  Respiratory effort normal. No retractions or accessory muscle use Cardiovascular:  RRR, no m/r/g No LE extremity edema   Normal pedal pulses Abdomen:  Abdomen appears normal; no tenderness or masses No hernias No HSM Musculoskeletal:  Digits/nails BUE: no clubbing, cyanosis, petechiae, infection exam of joints, bones, muscles of at least one of following: head/neck, RUE, LUE, RLE, LLE   strength and tone normal, no atrophy, no abnormal movements No tenderness, masses Normal ROM, no contractures  gait and station Skin:  No rashes, lesions, ulcers palpation of skin: no induration or nodules Neurologic:  CN 2-12 intact Sensation all 4 extremities intact Psychiatric:  Mental status Mood, affect appropriate Orientation to person, place, time    I have personally reviewed the following:   Today's Data   Today's Vitals   02/02/23 1955 02/03/23 0451 02/03/23 0600 02/03/23 1159  BP: (!) 114/58 126/70    Pulse: 86 84    Resp: 20 18    Temp: 98.4 F (36.9 C) 98.7 F (37.1 C)    TempSrc: Oral Oral    SpO2: 100% 100%    Weight:      Height:      PainSc: 0-No pain 10-Worst pain ever Asleep Asleep   Body mass index is 26.73 kg/m.   Lab Data      Latest Ref Rng & Units 02/01/2023    2:26 AM 01/31/2023    2:35 PM 01/30/2023    3:42 AM  CBC  WBC 4.0 - 10.5 K/uL 14.2  17.3  11.8   Hemoglobin 13.0 - 17.0 g/dL 8.2  9.1  9.7   Hematocrit 39.0 - 52.0 % 24.9  28.6  30.4   Platelets 150 - 400 K/uL 362  440  182       Latest Ref Rng & Units 02/02/2023   11:34 AM 02/01/2023    2:26 AM 01/31/2023    4:09 AM  BMP  Glucose 70 - 99 mg/dL 94  85  107   BUN 8 - 23 mg/dL 43  67  61   Creatinine 0.61 - 1.24 mg/dL 2.86  3.93  3.45   Sodium 135 - 145 mmol/L 133  134  133    Potassium 3.5 - 5.1 mmol/L 3.4  4.1  4.0   Chloride 98 - 111 mmol/L 96  98  96   CO2 22 - 32 mmol/L 25  23  22   $ Calcium 8.9 - 10.3 mg/dL 9.2  9.2  9.2      Micro Data  Results for orders placed or performed during the hospital encounter of 01/22/23  Resp panel by RT-PCR (RSV, Flu A&B, Covid) Anterior Nasal Swab     Status: None   Collection Time: 01/22/23  6:05 PM   Specimen: Anterior Nasal Swab  Result Value Ref Range Status   SARS Coronavirus 2 by RT PCR NEGATIVE NEGATIVE Final    Comment: (NOTE) SARS-CoV-2 target nucleic acids are NOT DETECTED.  The SARS-CoV-2 RNA is generally detectable in upper respiratory specimens during the acute phase of infection. The lowest concentration of SARS-CoV-2 viral copies this assay can detect is 138 copies/mL. A negative result does not preclude SARS-Cov-2 infection and should not be used as the sole basis for treatment or other patient management decisions. A negative result may occur with  improper specimen collection/handling, submission of specimen other than nasopharyngeal swab, presence of viral mutation(s) within the areas targeted by this assay, and inadequate number of viral copies(<138 copies/mL). A negative result must be combined with clinical observations, patient history, and epidemiological information. The expected result is Negative.  Fact Sheet for Patients:  EntrepreneurPulse.com.au  Fact Sheet for Healthcare Providers:  IncredibleEmployment.be  This test is no t yet approved or cleared by the [place] FDA and  has been authorized for detection and/or diagnosis of SARS-CoV-2 by FDA under an Emergency Use Authorization (EUA). This EUA will remain  in effect (meaning this test can be used) for the duration of the COVID-19 declaration under Section 564(b)(1) of the Act, 21 U.S.C.section 360bbb-3(b)(1), unless the authorization is terminated  or revoked sooner.        Influenza A by PCR NEGATIVE NEGATIVE Final   Influenza B by PCR NEGATIVE NEGATIVE Final    Comment: (NOTE) The Xpert Xpress SARS-CoV-2/FLU/RSV plus assay is intended as an aid in the diagnosis of influenza from Nasopharyngeal swab specimens and should not be used as a sole basis for treatment. Nasal washings and aspirates are unacceptable for Xpert Xpress SARS-CoV-2/FLU/RSV testing.  Fact Sheet for Patients: EntrepreneurPulse.com.au  Fact Sheet for Healthcare Providers: IncredibleEmployment.be  This test is not yet approved or cleared by the [place] FDA and has been authorized for detection and/or diagnosis of SARS-CoV-2 by FDA under an Emergency Use Authorization (EUA). This EUA will remain in effect (meaning this test can be used) for the duration of the COVID-19 declaration under Section 564(b)(1) of the Act, 21 U.S.C. section 360bbb-3(b)(1), unless the authorization is terminated or revoked.     Resp Syncytial Virus by PCR NEGATIVE NEGATIVE Final    Comment: (NOTE) Fact Sheet for Patients: EntrepreneurPulse.com.au  Fact Sheet for Healthcare Providers: IncredibleEmployment.be  This test is not yet approved or cleared by the [place] FDA and has been authorized for detection and/or diagnosis of SARS-CoV-2 by FDA under an Emergency Use Authorization (EUA). This EUA will remain in effect (meaning this test can be used) for the duration of the COVID-19 declaration under Section 564(b)(1) of the Act, 21 U.S.C. section 360bbb-3(b)(1), unless the authorization is terminated or revoked.  Performed at [facility], [address], [place], [place] [address]   Culture, blood (Routine x 2)     Status: Abnormal   Collection Time: 01/22/23  6:25 PM   Specimen: BLOOD RIGHT HAND  Result Value Ref Range Status   Specimen Description   Final    BLOOD RIGHT HAND Performed at [facility], [address], [place], [place] [address]    Special Requests   Final    BOTTLES DRAWN  AEROBIC ONLY Blood Culture adequate volume Performed at [facility], [address], [place], [place] [address]    Culture  Setup Time   Final    GRAM NEGATIVE RODS AEROBIC BOTTLE Gram Stain Report Called to,Read Back By and Verified With: BARBOUR,A @0855$  BY MATTHEWS, B 2.1.2024 CRITICAL RESULT CALLED TO, READ BACK BY AND VERIFIED WITH: PHARMD AUSTIN PAYTES [id number] BY JRS Performed at [facility], [place] [address] [place], [place] [address]    Culture ESCHERICHIA COLI (A)  Final   Report Status 01/25/2023 FINAL  Final   Organism ID, Bacteria ESCHERICHIA COLI  Final      Susceptibility   Escherichia coli - MIC*    AMPICILLIN >=32 RESISTANT Resistant     CEFAZOLIN <=4 SENSITIVE Sensitive     CEFEPIME <=0.12 SENSITIVE Sensitive     CEFTAZIDIME <=1 SENSITIVE Sensitive     CEFTRIAXONE <=0.25 SENSITIVE Sensitive     CIPROFLOXACIN <=0.25 SENSITIVE Sensitive     GENTAMICIN <=1 SENSITIVE Sensitive     IMIPENEM <=0.25 SENSITIVE Sensitive     TRIMETH/SULFA <=20 SENSITIVE Sensitive     AMPICILLIN/SULBACTAM >=32 RESISTANT Resistant     PIP/TAZO <=4 SENSITIVE Sensitive     * ESCHERICHIA COLI  Blood Culture ID Panel (Reflexed)     Status: Abnormal   Collection Time: 01/22/23  6:25 PM  Result Value Ref Range Status   Enterococcus faecalis NOT DETECTED NOT DETECTED Final   Enterococcus Faecium NOT DETECTED NOT DETECTED Final   Listeria monocytogenes NOT DETECTED NOT DETECTED Final   Staphylococcus species NOT DETECTED NOT DETECTED Final   Staphylococcus aureus (BCID) NOT DETECTED NOT DETECTED Final   Staphylococcus epidermidis NOT DETECTED NOT DETECTED Final   Staphylococcus lugdunensis NOT DETECTED NOT DETECTED Final   Streptococcus species NOT DETECTED NOT DETECTED Final   Streptococcus agalactiae NOT DETECTED NOT DETECTED Final   Streptococcus pneumoniae NOT DETECTED NOT DETECTED Final   Streptococcus pyogenes  NOT DETECTED NOT DETECTED Final   A.calcoaceticus-baumannii NOT DETECTED NOT DETECTED Final   Bacteroides fragilis NOT DETECTED NOT DETECTED Final   Enterobacterales DETECTED (A) NOT DETECTED Final    Comment: Enterobacterales represent a large order of gram negative bacteria, not a single organism. CRITICAL RESULT CALLED TO, READ BACK BY AND VERIFIED WITH: PHARMD [place] PAYTES [id number] 1458 BY JRS    Enterobacter cloacae complex NOT DETECTED NOT DETECTED Final   Escherichia coli DETECTED (A) NOT DETECTED Final    Comment: CRITICAL RESULT CALLED TO, READ BACK BY AND VERIFIED WITH: PAHRMD A[place]IN PAYTES [id number] 1458 BY JRS    Klebsiella aerogenes NOT DETECTED NOT DETECTED Final   Klebsiella oxytoca NOT DETECTED NOT DETECTED Final   Klebsiella pneumoniae NOT DETECTED NOT DETECTED Final   Proteus species NOT DETECTED NOT DETECTED Final   Salmonella species NOT DETECTED NOT DETECTED Final   Serratia marcescens NOT DETECTED NOT DETECTED Final   Haemophilus influenzae NOT DETECTED NOT DETECTED Final   Neisseria meningitidis NOT DETECTED NOT DETECTED Final   Pseudomonas aeruginosa NOT DETECTED NOT DETECTED Final   Stenotrophomonas maltophilia NOT DETECTED NOT DETECTED Final   Candida albicans NOT DETECTED NOT DETECTED Final   Candida auris NOT DETECTED NOT DETECTED Final   Candida glabrata NOT DETECTED NOT DETECTED Final   Candida krusei NOT DETECTED NOT DETECTED Final   Candida parapsilosis NOT DETECTED NOT DETECTED Final   Candida tropicalis NOT DETECTED NOT DETECTED Final   Cryptococcus neoformans/gattii NOT DETECTED NOT DETECTED Final   CTX-M ESBL NOT DETECTED  NOT DETECTED Final   Carbapenem resistance IMP NOT DETECTED NOT DETECTED Final   Carbapenem resistance KPC NOT DETECTED NOT DETECTED Final   Carbapenem resistance NDM NOT DETECTED NOT DETECTED Final   Carbapenem resist OXA 48 LIKE NOT DETECTED NOT DETECTED Final   Carbapenem resistance VIM NOT DETECTED NOT DETECTED Final     Comment: Performed at [facility], [place] [address], [place], [place] [address]  Culture, blood (Routine x 2)     Status: Abnormal   Collection Time: 01/22/23  6:42 PM   Specimen: BLOOD RIGHT HAND  Result Value Ref Range Status   Specimen Description   Final    BLOOD RIGHT HAND Performed at [facility], [address], [place], [place] [address]    Special Requests   Final    BOTTLES DRAWN AEROBIC AND ANAEROBIC Blood Culture adequate volume Performed at [place] [place], [place] [address]    Culture  Setup Time   Final    GRAM NEGATIVE RODS IN BOTH AEROBIC AND ANAEROBIC BOTTLES CRITICAL RESULT CALLED TO, READ BACK BY AND VERIFIED WITH: PHARMD CAREN AMEND ON 01/23/23 @ 1803 BY DRT    Culture MORGANELLA MORGANII (A)  Final   Report Status 01/25/2023 FINAL  Final   Organism ID, Bacteria MORGANELLA MORGANII  Final      Susceptibility   Morganella morganii - MIC*    AMPICILLIN >=32 RESISTANT Resistant     CEFAZOLIN >=64 RESISTANT Resistant     CEFTAZIDIME <=1 SENSITIVE Sensitive     CIPROFLOXACIN <=0.25 SENSITIVE Sensitive     GENTAMICIN <=1 SENSITIVE Sensitive     IMIPENEM 2 SENSITIVE Sensitive     TRIMETH/SULFA <=20 SENSITIVE Sensitive     AMPICILLIN/SULBACTAM 4 SENSITIVE Sensitive     PIP/TAZO <=4 SENSITIVE Sensitive     * MORGANELLA MORGANII  Blood Culture ID Panel (Reflexed)     Status: Abnormal   Collection Time: 01/22/23  6:42 PM  Result Value Ref Range Status   Enterococcus faecalis NOT DETECTED NOT DETECTED Final   Enterococcus Faecium NOT DETECTED NOT DETECTED Final   Listeria monocytogenes NOT DETECTED NOT DETECTED Final   Staphylococcus species NOT DETECTED NOT DETECTED Final   Staphylococcus aureus (BCID) NOT DETECTED NOT DETECTED Final   Staphylococcus epidermidis NOT DETECTED NOT DETECTED Final   Staphylococcus lugdunensis NOT DETECTED NOT DETECTED Final   Streptococcus species NOT DETECTED NOT DETECTED Final   Streptococcus agalactiae NOT  DETECTED NOT DETECTED Final   Streptococcus pneumoniae NOT DETECTED NOT DETECTED Final   Streptococcus pyogenes NOT DETECTED NOT DETECTED Final   A.calcoaceticus-baumannii NOT DETECTED NOT DETECTED Final   Bacteroides fragilis NOT DETECTED NOT DETECTED Final   Enterobacterales DETECTED (A) NOT DETECTED Final    Comment: Enterobacterales represent a large order of gram negative bacteria, not a single organism. Refer to culture for further identification. CRITICAL RESULT CALLED TO, READ BACK BY AND VERIFIED WITH: PHARMD CAREN AMEND ON 01/23/23 @ 1817 BY DRT    Enterobacter cloacae complex NOT DETECTED NOT DETECTED Final   Escherichia coli NOT DETECTED NOT DETECTED Final   Klebsiella aerogenes NOT DETECTED NOT DETECTED Final   Klebsiella oxytoca NOT DETECTED NOT DETECTED Final   Klebsiella pneumoniae NOT DETECTED NOT DETECTED Final   Proteus species NOT DETECTED NOT DETECTED Final   Salmonella species NOT DETECTED NOT DETECTED Final   Serratia marcescens NOT DETECTED NOT DETECTED Final   Haemophilus influenzae NOT DETECTED NOT DETECTED Final   Neisseria meningitidis NOT DETECTED  NOT DETECTED Final   Pseudomonas aeruginosa NOT DETECTED NOT DETECTED Final   Stenotrophomonas maltophilia NOT DETECTED NOT DETECTED Final   Candida albicans NOT DETECTED NOT DETECTED Final   Candida auris NOT DETECTED NOT DETECTED Final   Candida glabrata NOT DETECTED NOT DETECTED Final   Candida krusei NOT DETECTED NOT DETECTED Final   Candida parapsilosis NOT DETECTED NOT DETECTED Final   Candida tropicalis NOT DETECTED NOT DETECTED Final   Cryptococcus neoformans/gattii NOT DETECTED NOT DETECTED Final   CTX-M ESBL NOT DETECTED NOT DETECTED Final   Carbapenem resistance IMP NOT DETECTED NOT DETECTED Final   Carbapenem resistance KPC NOT DETECTED NOT DETECTED Final   Carbapenem resistance NDM NOT DETECTED NOT DETECTED Final   Carbapenem resist OXA 48 LIKE NOT DETECTED NOT DETECTED Final   Carbapenem resistance  VIM NOT DETECTED NOT DETECTED Final    Comment: Performed at [facility], [address], [place], [place] [address]  MRSA Next Gen by PCR, Nasal     Status: None   Collection Time: 01/22/23 11:58 PM   Specimen: Nasal Mucosa; Nasal Swab  Result Value Ref Range Status   MRSA by PCR Next Gen NOT DETECTED NOT DETECTED Final    Comment: (NOTE) The GeneXpert MRSA Assay (FDA approved for NASAL specimens only), is one component of a comprehensive MRSA colonization surveillance program. It is not intended to diagnose MRSA infection nor to guide or monitor treatment for MRSA infections. Test performance is not FDA approved in patients less than 32 years old. Performed at [facility], [place] [address] [place], [place] [address]   Culture, blood (Routine X 2) w Reflex to ID Panel     Status: None   Collection Time: 01/24/23 10:59 AM   Specimen: BLOOD LEFT ARM  Result Value Ref Range Status   Specimen Description BLOOD LEFT ARM  Final   Special Requests   Final    BOTTLES DRAWN AEROBIC ONLY Blood Culture adequate volume   Culture   Final    NO GROWTH 5 DAYS Performed at [facility], [place] [address] [place], [place] [address]    Report Status 01/29/2023 FINAL  Final  Culture, blood (Routine X 2) w Reflex to ID Panel     Status: None   Collection Time: 01/24/23  7:21 PM   Specimen: BLOOD LEFT HAND  Result Value Ref Range Status   Specimen Description BLOOD LEFT HAND  Final   Special Requests IN PEDIATRIC BOTTLE Blood Culture adequate volume  Final   Culture   Final    NO GROWTH 5 DAYS Performed at [facility], [place] [address] [place], [place] [address]    Report Status 01/29/2023 FINAL  Final    Scheduled Meds:  (feeding supplement) PROSource Plus  30 mL Oral BID BM   acetaminophen  1,000 mg Oral Q8H   aspirin EC  81 mg Oral Daily   atorvastatin  40 mg Oral Daily   Chlorhexidine Gluconate Cloth  6 each Topical Q0600   darbepoetin (ARANESP) injection -  DIALYSIS  100 mcg Subcutaneous Q Fri-1800   feeding supplement  237 mL Oral TID BM   folic acid  1 mg Oral Daily   gabapentin  100 mg Oral TID   heparin  5,000 Units Subcutaneous Q8H   isosorbide dinitrate  5 mg Oral BID   leptospermum manuka honey  1 Application Topical Daily   midodrine  10 mg Oral Q M,W,F-HD   multivitamin  1 tablet Oral QHS   pantoprazole  40 mg Oral Daily   sodium hypochlorite   Irrigation BID   Continuous Infusions:  sodium chloride Stopped (02/01/23 2102)   ceFEPime (MAXIPIME) IV 1 g (02/02/23 2004)    Principal Problem:   Septic shock due to E coli and Morganella bacteremia due to coccygeal osteomyelitis due to sacral ulcer Active Problems:   Chronic osteomyelitis of coccyx (HCC)   Diabetes mellitus type 2 in obese (HCC)   Iron deficiency anemia   End stage renal disease on dialysis (HCC)   Malnutrition of moderate degree   Acute metabolic encephalopathy   LOS: 12 days   A & P  Septic shock due to E coli and Morganella bacteremia due to coccygeal osteomyelitis due to sacral ulcer See below.  Chronic osteomyelitis of coccyx ([place]) Seen by ID and General Surgery.  No debridement would be helpful.  ID note that coccygeal osteo is not curable and patient is high risk for recurrent infection and readmission for this.  He has completed 7 days Flagyl and is on day 9 of 14 of Cefepime EOT 2/15 - Continue cefepime - Continue midodrine - Follow up with ID after discharge - Continue oxycodone for pain - Dakins and medihoney on non-hydro days  We will continue hydrotherapy while inpatient, although per PT, the benefit appears to be low, and we will plan to stop hydrotherapy at discharge from acute inpatient care. Osteo will likely never be cured.  End stage renal disease on dialysis [facility]) - Consult nephrology for HD _ May discharge to SNF when able to sit up for HD.  Acute metabolic encephalopathy Resolved.  E coli bacteremia See above  Infection of  bloodstream concurrent with and due to presence of hemodialysis catheter ([place]) See above  Iron deficiency anemia Hgb stable - Continue Aranesp, folate _ Iron supplementation held due to active infection  Diabetes mellitus type 2 in obese (HCC) Glucose well controlled off insulin. - Continue aspirin and gabapentin and atorvastatin  Malnutrition of moderate degree As evidenced by loss of subcutaneous muscle mass and fat, weight loss >10 kg in last 8 months  DVT prophylaxis: Heparin Code Status: Full Code Family Communication: None available Disposition Plan: SNF    Dekari Bures, DO [facility] Direct contact: see [email or web address]  7PM-7AM contact night coverage as above 02/03/2023, 3:06 PM  LOS: 12 days

## 2023-02-04 DIAGNOSIS — R6521 Severe sepsis with septic shock: Secondary | ICD-10-CM | POA: Diagnosis not present

## 2023-02-04 DIAGNOSIS — A419 Sepsis, unspecified organism: Secondary | ICD-10-CM | POA: Diagnosis not present

## 2023-02-04 LAB — RENAL FUNCTION PANEL
Albumin: 1.8 g/dL — ABNORMAL LOW (ref 3.5–5.0)
Anion gap: 13 (ref 5–15)
BUN: 48 mg/dL — ABNORMAL HIGH (ref 8–23)
CO2: 23 mmol/L (ref 22–32)
Calcium: 9.5 mg/dL (ref 8.9–10.3)
Chloride: 98 mmol/L (ref 98–111)
Creatinine, Ser: 3.46 mg/dL — ABNORMAL HIGH (ref 0.61–1.24)
GFR, Estimated: 17 mL/min — ABNORMAL LOW (ref >60)
Glucose, Bld: 101 mg/dL — ABNORMAL HIGH (ref 70–99)
Phosphorus: 3.8 mg/dL (ref 2.5–4.6)
Potassium: 4.2 mmol/L (ref 3.5–5.1)
Sodium: 134 mmol/L — ABNORMAL LOW (ref 135–145)

## 2023-02-04 LAB — GLUCOSE, CAPILLARY
Glucose-Capillary: 89 mg/dL (ref 70–99)
Glucose-Capillary: 87 mg/dL (ref 70–99)
Glucose-Capillary: 110 mg/dL — ABNORMAL HIGH (ref 70–99)
Glucose-Capillary: 126 mg/dL — ABNORMAL HIGH (ref 70–99)

## 2023-02-04 LAB — CBC WITH DIFFERENTIAL/PLATELET
Abs Immature Granulocytes: 0.07 10*3/uL (ref 0.00–0.07)
Basophils Absolute: 0.1 10*3/uL (ref 0.0–0.1)
Basophils Relative: 1 %
Eosinophils Absolute: 0.3 10*3/uL (ref 0.0–0.5)
Eosinophils Relative: 2 %
HCT: 26.4 % — ABNORMAL LOW (ref 39.0–52.0)
Hemoglobin: 8.5 g/dL — ABNORMAL LOW (ref 13.0–17.0)
Immature Granulocytes: 0 %
Lymphocytes Relative: 11 %
Lymphs Abs: 1.7 10*3/uL (ref 0.7–4.0)
MCH: 31.8 pg (ref 26.0–34.0)
MCHC: 32.2 g/dL (ref 30.0–36.0)
MCV: 98.9 fL (ref 80.0–100.0)
Monocytes Absolute: 1.4 10*3/uL — ABNORMAL HIGH (ref 0.1–1.0)
Monocytes Relative: 9 %
Neutro Abs: 12 10*3/uL — ABNORMAL HIGH (ref 1.7–7.7)
Neutrophils Relative %: 77 %
Platelets: 446 10*3/uL — ABNORMAL HIGH (ref 150–400)
RBC: 2.67 MIL/uL — ABNORMAL LOW (ref 4.22–5.81)
RDW: 19 % — ABNORMAL HIGH (ref 11.5–15.5)
WBC: 15.6 10*3/uL — ABNORMAL HIGH (ref 4.0–10.5)
nRBC: 0 % (ref 0.0–0.2)

## 2023-02-04 MED ORDER — HYDROMORPHONE HCL 1 MG/ML IJ SOLN
0.5000 mg | INTRAMUSCULAR | Status: AC
  Administered 2023-02-04: 0.5 mg via INTRAVENOUS
  Filled 2023-02-04: qty 0.5

## 2023-02-04 NOTE — Progress Notes (Signed)
PROGRESS NOTE  [name] [id number] DOB: [birth year] DOA: 01/22/2023 PCP: Redmond School, MD  Brief History   [name] is a 77 y.o. M with ESRD on HD MWF, CAD, DM, sCHF previous EF 30-35% now recovered, recent episode of chronic cholecystitis and then prolonged hospitalization last month for NSTEMI complicated by infected sacral decubitus ulcer who was brought from dialysis due to hypotension.    8/6-8/19/23: Admitted for fluid overload, started HD, discharged to SNF  8/28-08/23/22: Readmitted to [facility] for epigastric pain, found to have cholecystitis, IR placed cholecystostomy tube  9/22-9/25/23: Readmitted to [facility] for hypotension, hypoglycemia  09/24/22: Sacral pressure injury noted by General Surgery, already had been discharged from rehab and patient reporting to Gen Surg "He is not able to walk and he says he wants to go back to a rehab to get stronger."  12/12/22-01/21/23: Presented for elective cholecystectomy, found during preop eval to have diffuse ECG changes, transferred to [facility] for left heart cath; able to have lap chole on 12/28, then hospitalization then prolonged by infection of sacral decubitus ulcer, now stage IV, requiring IV antibiotics    01/22/23: Found to be hypotensive during first HD session post-discharge, sent back to hospital and admitted to ICU on pressors 2/1: Blood cultures growing E coli and Morganella; ID and Nephrology consulted; tunneled line removed; CT abdomen shows sacral ulcer with coccygeal osteomyelitis 2/5: Tunneled line replaced 2/6-present: Awaiting placement 02/03/2023: Patient is complaining of severe pain in backside due to long intervals between oxy dosing. Will increase frequency to q4 hours. 02/04/2023: No new complaints. The patient will need to complete his antibiotics here as facility will not be able to give them IV. He will complete his antibiotics on Thursday.  Consultants  Interventional Radiology Palliative Care Infectious  Disease Wound Care  Procedures  01/27/2023 Tunnelled HD Catheter placed by IR HD  Antibiotics   Anti-infectives (From admission, onward)    Start     Dose/Rate Route Frequency Ordered Stop   01/27/23 1621  ceFAZolin (ANCEF) 2-4 GM/100ML-% IVPB       Note to Pharmacy: Leak, [name]      01/27/23 1621 01/27/23 1625   01/27/23 1000  metroNIDAZOLE (FLAGYL) IVPB 500 mg        500 mg 100 mL/hr over 60 Minutes Intravenous  Once 01/27/23 0852 01/27/23 1210   01/27/23 0800  ceFAZolin (ANCEF) IVPB 2g/100 mL premix        2 g 200 mL/hr over 30 Minutes Intravenous To Radiology 01/26/23 1123 01/27/23 0914   01/24/23 2200  metroNIDAZOLE (FLAGYL) tablet 500 mg        500 mg Oral Every 12 hours 01/24/23 1627 01/31/23 1227   01/24/23 2000  ceFEPIme (MAXIPIME) 1 g in sodium chloride 0.9 % 100 mL IVPB        1 g 200 mL/hr over 30 Minutes Intravenous Every 24 hours 01/24/23 1627 02/06/23 2359   01/24/23 1800  vancomycin (VANCOCIN) IVPB 1000 mg/200 mL premix  Status:  Discontinued        1,000 mg 200 mL/hr over 60 Minutes Intravenous Every M-W-F (Hemodialysis) 01/22/23 1820 01/23/23 1057   01/23/23 2000  ceFEPIme (MAXIPIME) 2 g in sodium chloride 0.9 % 100 mL IVPB  Status:  Discontinued        2 g 200 mL/hr over 30 Minutes Intravenous Every 24 hours 01/22/23 1820 01/23/23 0102   01/23/23 0600  piperacillin-tazobactam (ZOSYN) IVPB 2.25 g  Status:  Discontinued  2.25 g 100 mL/hr over 30 Minutes Intravenous Every 8 hours 01/23/23 0119 01/24/23 1627   01/22/23 1815  ceFEPIme (MAXIPIME) 2 g in sodium chloride 0.9 % 100 mL IVPB        2 g 200 mL/hr over 30 Minutes Intravenous  Once 01/22/23 1804 01/22/23 1944   01/22/23 1815  metroNIDAZOLE (FLAGYL) IVPB 500 mg        500 mg 100 mL/hr over 60 Minutes Intravenous  Once 01/22/23 1804 01/22/23 2005   01/22/23 1815  vancomycin (VANCOCIN) IVPB 1000 mg/200 mL premix        1,000 mg 200 mL/hr over 60 Minutes Intravenous  Once 01/22/23  1804 01/22/23 2107      Subjective  The patient is without new complaints.  Objective   Vitals:  Vitals:   02/02/23 1955 02/03/23 0451  BP: (!) 114/58 126/70  Pulse: 86 84  Resp: 20 18  Temp: 98.4 F (36.9 C) 98.7 F (37.1 C)  SpO2: 100% 100%    Exam:  Constitutional:  Appears calm and comfortable Respiratory:  CTA bilaterally, no w/r/r.  Respiratory effort normal. No retractions or accessory muscle use Cardiovascular:  RRR, no m/r/g No LE extremity edema   Normal pedal pulses Abdomen:  Abdomen appears normal; no tenderness or masses No hernias No HSM Musculoskeletal:  Digits/nails BUE: no clubbing, cyanosis, petechiae, infection exam of joints, bones, muscles of at least one of following: head/neck, RUE, LUE, RLE, LLE   strength and tone normal, no atrophy, no abnormal movements No tenderness, masses Normal ROM, no contractures  gait and station Skin:  No rashes, lesions, ulcers palpation of skin: no induration or nodules Neurologic:  CN 2-12 intact Sensation all 4 extremities intact Psychiatric:  Mental status Mood, affect appropriate Orientation to person, place, time    I have personally reviewed the following:   Today's Data   Today's Vitals   02/02/23 1955 02/03/23 0451 02/03/23 0600 02/03/23 1159  BP: (!) 114/58 126/70    Pulse: 86 84    Resp: 20 18    Temp: 98.4 F (36.9 C) 98.7 F (37.1 C)    TempSrc: Oral Oral    SpO2: 100% 100%    Weight:      Height:      PainSc: 0-No pain 10-Worst pain ever Asleep Asleep   Body mass index is 26.73 kg/m.   Lab Data      Latest Ref Rng & Units 02/01/2023    2:26 AM 01/31/2023    2:35 PM 01/30/2023    3:42 AM  CBC  WBC 4.0 - 10.5 K/uL 14.2  17.3  11.8   Hemoglobin 13.0 - 17.0 g/dL 8.2  9.1  9.7   Hematocrit 39.0 - 52.0 % 24.9  28.6  30.4   Platelets 150 - 400 K/uL 362  440  182       Latest Ref Rng & Units 02/02/2023   11:34 AM 02/01/2023    2:26 AM 01/31/2023    4:09 AM  BMP  Glucose 70  - 99 mg/dL 94  85  107   BUN 8 - 23 mg/dL 43  67  61   Creatinine 0.61 - 1.24 mg/dL 2.86  3.93  3.45   Sodium 135 - 145 mmol/[name] 133  134  133   Potassium 3.5 - 5.1 mmol/[name] 3.4  4.1  4.0   Chloride 98 - 111 mmol/[name] 96  98  96   CO2 22 - 32 mmol/[name] 25  23  22   Calcium 8.9 - 10.3 mg/dL 9.2  9.2  9.2      Micro Data   Results for orders placed or performed during the hospital encounter of 01/22/23  Resp panel by RT-PCR (RSV, Flu A&B, Covid) Anterior Nasal Swab     Status: None   Collection Time: 01/22/23  6:05 PM   Specimen: Anterior Nasal Swab  Result Value Ref Range Status   SARS Coronavirus 2 by RT PCR NEGATIVE NEGATIVE Final    Comment: (NOTE) SARS-CoV-2 target nucleic acids are NOT DETECTED.  The SARS-CoV-2 RNA is generally detectable in upper respiratory specimens during the acute phase of infection. The lowest concentration of SARS-CoV-2 viral copies this assay can detect is 138 copies/mL. A negative result does not preclude SARS-Cov-2 infection and should not be used as the sole basis for treatment or other patient management decisions. A negative result may occur with  improper specimen collection/handling, submission of specimen other than nasopharyngeal swab, presence of viral mutation(s) within the areas targeted by this assay, and inadequate number of viral copies(<138 copies/mL). A negative result must be combined with clinical observations, patient history, and epidemiological information. The expected result is Negative.  Fact Sheet for Patients:  EntrepreneurPulse.com.au  Fact Sheet for Healthcare Providers:  IncredibleEmployment.be  This test is no t yet approved or cleared by the [place] FDA and  has been authorized for detection and/or diagnosis of SARS-CoV-2 by FDA under an Emergency Use Authorization (EUA). This EUA will remain  in effect (meaning this test can be used) for the duration of the COVID-19 declaration  under Section 564(b)(1) of the Act, 21 U.S.C.section 360bbb-3(b)(1), unless the authorization is terminated  or revoked sooner.       Influenza A by PCR NEGATIVE NEGATIVE Final   Influenza B by PCR NEGATIVE NEGATIVE Final    Comment: (NOTE) The Xpert Xpress SARS-CoV-2/FLU/RSV plus assay is intended as an aid in the diagnosis of influenza from Nasopharyngeal swab specimens and should not be used as a sole basis for treatment. Nasal washings and aspirates are unacceptable for Xpert Xpress SARS-CoV-2/FLU/RSV testing.  Fact Sheet for Patients: EntrepreneurPulse.com.au  Fact Sheet for Healthcare Providers: IncredibleEmployment.be  This test is not yet approved or cleared by the [place] FDA and has been authorized for detection and/or diagnosis of SARS-CoV-2 by FDA under an Emergency Use Authorization (EUA). This EUA will remain in effect (meaning this test can be used) for the duration of the COVID-19 declaration under Section 564(b)(1) of the Act, 21 U.S.C. section 360bbb-3(b)(1), unless the authorization is terminated or revoked.     Resp Syncytial Virus by PCR NEGATIVE NEGATIVE Final    Comment: (NOTE) Fact Sheet for Patients: EntrepreneurPulse.com.au  Fact Sheet for Healthcare Providers: IncredibleEmployment.be  This test is not yet approved or cleared by the [place] FDA and has been authorized for detection and/or diagnosis of SARS-CoV-2 by FDA under an Emergency Use Authorization (EUA). This EUA will remain in effect (meaning this test can be used) for the duration of the COVID-19 declaration under Section 564(b)(1) of the Act, 21 U.S.C. section 360bbb-3(b)(1), unless the authorization is terminated or revoked.  Performed at [facility], [address] [place], [place] [address]   Culture, blood (Routine x 2)     Status: Abnormal   Collection Time: 01/22/23  6:25 PM   Specimen: BLOOD  RIGHT HAND  Result Value Ref Range Status   Specimen Description   Final    BLOOD RIGHT HAND Performed at  [place] [place], [place] [address]    Special Requests   Final    BOTTLES DRAWN AEROBIC ONLY Blood Culture adequate volume Performed at [facility], [address] [place], [place] [address]    Culture  Setup Time   Final    GRAM NEGATIVE RODS AEROBIC BOTTLE Gram Stain Report Called to,Read Back By and Verified With: BARBOUR,A @0855$  BY MATTHEWS, B 2.1.2024 CRITICAL RESULT CALLED TO, READ BACK BY AND VERIFIED WITH: PHARMD AUSTIN PAYTES [id number] BY JRS Performed at [facility], [place] [address], [place],  [address]    Culture ESCHERICHIA COLI (A)  Final   Report Status 01/25/2023 FINAL  Final   Organism ID, Bacteria ESCHERICHIA COLI  Final      Susceptibility   Escherichia coli - MIC*    AMPICILLIN >=32 RESISTANT Resistant     CEFAZOLIN <=4 SENSITIVE Sensitive     CEFEPIME <=0.12 SENSITIVE Sensitive     CEFTAZIDIME <=1 SENSITIVE Sensitive     CEFTRIAXONE <=0.25 SENSITIVE Sensitive     CIPROFLOXACIN <=0.25 SENSITIVE Sensitive     GENTAMICIN <=1 SENSITIVE Sensitive     IMIPENEM <=0.25 SENSITIVE Sensitive     TRIMETH/SULFA <=20 SENSITIVE Sensitive     AMPICILLIN/SULBACTAM >=32 RESISTANT Resistant     PIP/TAZO <=4 SENSITIVE Sensitive     * ESCHERICHIA COLI  Blood Culture ID Panel (Reflexed)     Status: Abnormal   Collection Time: 01/22/23  6:25 PM  Result Value Ref Range Status   Enterococcus faecalis NOT DETECTED NOT DETECTED Final   Enterococcus Faecium NOT DETECTED NOT DETECTED Final   Listeria monocytogenes NOT DETECTED NOT DETECTED Final   Staphylococcus species NOT DETECTED NOT DETECTED Final   Staphylococcus aureus (BCID) NOT DETECTED NOT DETECTED Final   Staphylococcus epidermidis NOT DETECTED NOT DETECTED Final   Staphylococcus lugdunensis NOT DETECTED NOT DETECTED Final   Streptococcus species NOT DETECTED NOT DETECTED Final    Streptococcus agalactiae NOT DETECTED NOT DETECTED Final   Streptococcus pneumoniae NOT DETECTED NOT DETECTED Final   Streptococcus pyogenes NOT DETECTED NOT DETECTED Final   A.calcoaceticus-baumannii NOT DETECTED NOT DETECTED Final   Bacteroides fragilis NOT DETECTED NOT DETECTED Final   Enterobacterales DETECTED (A) NOT DETECTED Final    Comment: Enterobacterales represent a large order of gram negative bacteria, not a single organism. CRITICAL RESULT CALLED TO, READ BACK BY AND VERIFIED WITH: PHARMD [place]UST[place] PAYTES [id number] BY JRS    Enterobacter cloacae complex NOT DETECTED NOT DETECTED Final   Escherichia coli DETECTED (A) NOT DETECTED Final    Comment: CRITICAL RESULT CALLED TO, READ BACK BY AND VERIFIED WITH: PAHRMD AUSTIN PAYTES [id number] BY JRS    Klebsiella aerogenes NOT DETECTED NOT DETECTED Final   Klebsiella oxytoca NOT DETECTED NOT DETECTED Final   Klebsiella pneumoniae NOT DETECTED NOT DETECTED Final   Proteus species NOT DETECTED NOT DETECTED Final   Salmonella species NOT DETECTED NOT DETECTED Final   Serratia marcescens NOT DETECTED NOT DETECTED Final   Haemophilus influenzae NOT DETECTED NOT DETECTED Final   Neisseria meningitidis NOT DETECTED NOT DETECTED Final   Pseudomonas aeruginosa NOT DETECTED NOT DETECTED Final   Stenotrophomonas maltophilia NOT DETECTED NOT DETECTED Final   Candida albicans NOT DETECTED NOT DETECTED Final   Candida auris NOT DETECTED NOT DETECTED Final   Candida glabrata NOT DETECTED NOT DETECTED Final   Candida krusei NOT DETECTED NOT DETECTED Final   Candida parapsilosis NOT DETECTED NOT DETECTED Final  Candida tropicalis NOT DETECTED NOT DETECTED Final   Cryptococcus neoformans/gattii NOT DETECTED NOT DETECTED Final   CTX-M ESBL NOT DETECTED NOT DETECTED Final   Carbapenem resistance IMP NOT DETECTED NOT DETECTED Final   Carbapenem resistance KPC NOT DETECTED NOT DETECTED Final   Carbapenem resistance NDM NOT DETECTED NOT DETECTED  Final   Carbapenem resist OXA 48 LIKE NOT DETECTED NOT DETECTED Final   Carbapenem resistance VIM NOT DETECTED NOT DETECTED Final    Comment: Performed at [facility], [place] [address] [place], [place] [address]  Culture, blood (Routine x 2)     Status: Abnormal   Collection Time: 01/22/23  6:42 PM   Specimen: BLOOD RIGHT HAND  Result Value Ref Range Status   Specimen Description   Final    BLOOD RIGHT HAND Performed at [facility], [address], [place], [place] [address]    Special Requests   Final    BOTTLES DRAWN AEROBIC AND ANAEROBIC Blood Culture adequate volume Performed at [place] [place], [place] [address]    Culture  Setup Time   Final    GRAM NEGATIVE RODS IN BOTH AEROBIC AND ANAEROBIC BOTTLES CRITICAL RESULT CALLED TO, READ BACK BY AND VERIFIED WITH: PHARMD CAREN AMEND ON 01/23/23 @ 1803 BY DRT    Culture MORGANELLA MORGANII (A)  Final   Report Status 01/25/2023 FINAL  Final   Organism ID, Bacteria MORGANELLA MORGANII  Final      Susceptibility   Morganella morganii - MIC*    AMPICILLIN >=32 RESISTANT Resistant     CEFAZOLIN >=64 RESISTANT Resistant     CEFTAZIDIME <=1 SENSITIVE Sensitive     CIPROFLOXACIN <=0.25 SENSITIVE Sensitive     GENTAMICIN <=1 SENSITIVE Sensitive     IMIPENEM 2 SENSITIVE Sensitive     TRIMETH/SULFA <=20 SENSITIVE Sensitive     AMPICILLIN/SULBACTAM 4 SENSITIVE Sensitive     PIP/TAZO <=4 SENSITIVE Sensitive     * MORGANELLA MORGANII  Blood Culture ID Panel (Reflexed)     Status: Abnormal   Collection Time: 01/22/23  6:42 PM  Result Value Ref Range Status   Enterococcus faecalis NOT DETECTED NOT DETECTED Final   Enterococcus Faecium NOT DETECTED NOT DETECTED Final   Listeria monocytogenes NOT DETECTED NOT DETECTED Final   Staphylococcus species NOT DETECTED NOT DETECTED Final   Staphylococcus aureus (BCID) NOT DETECTED NOT DETECTED Final   Staphylococcus epidermidis NOT DETECTED NOT DETECTED Final    Staphylococcus lugdunensis NOT DETECTED NOT DETECTED Final   Streptococcus species NOT DETECTED NOT DETECTED Final   Streptococcus agalactiae NOT DETECTED NOT DETECTED Final   Streptococcus pneumoniae NOT DETECTED NOT DETECTED Final   Streptococcus pyogenes NOT DETECTED NOT DETECTED Final   A.calcoaceticus-baumannii NOT DETECTED NOT DETECTED Final   Bacteroides fragilis NOT DETECTED NOT DETECTED Final   Enterobacterales DETECTED (A) NOT DETECTED Final    Comment: Enterobacterales represent a large order of gram negative bacteria, not a single organism. Refer to culture for further identification. CRITICAL RESULT CALLED TO, READ BACK BY AND VERIFIED WITH: PHARMD CAREN AMEND ON 01/23/23 @ 1817 BY DRT    Enterobacter cloacae complex NOT DETECTED NOT DETECTED Final   Escherichia coli NOT DETECTED NOT DETECTED Final   Klebsiella aerogenes NOT DETECTED NOT DETECTED Final   Klebsiella oxytoca NOT DETECTED NOT DETECTED Final   Klebsiella pneumoniae NOT DETECTED NOT DETECTED Final   Proteus species NOT DETECTED NOT DETECTED Final   Salmonella species NOT DETECTED NOT DETECTED Final  Serratia marcescens NOT DETECTED NOT DETECTED Final   Haemophilus influenzae NOT DETECTED NOT DETECTED Final   Neisseria meningitidis NOT DETECTED NOT DETECTED Final   Pseudomonas aeruginosa NOT DETECTED NOT DETECTED Final   Stenotrophomonas maltophilia NOT DETECTED NOT DETECTED Final   Candida albicans NOT DETECTED NOT DETECTED Final   Candida auris NOT DETECTED NOT DETECTED Final   Candida glabrata NOT DETECTED NOT DETECTED Final   Candida krusei NOT DETECTED NOT DETECTED Final   Candida parapsilosis NOT DETECTED NOT DETECTED Final   Candida tropicalis NOT DETECTED NOT DETECTED Final   Cryptococcus neoformans/gattii NOT DETECTED NOT DETECTED Final   CTX-M ESBL NOT DETECTED NOT DETECTED Final   Carbapenem resistance IMP NOT DETECTED NOT DETECTED Final   Carbapenem resistance KPC NOT DETECTED NOT DETECTED Final    Carbapenem resistance NDM NOT DETECTED NOT DETECTED Final   Carbapenem resist OXA 48 LIKE NOT DETECTED NOT DETECTED Final   Carbapenem resistance VIM NOT DETECTED NOT DETECTED Final    Comment: Performed at [facility], [address] [place], [place] [address]  MRSA Next Gen by PCR, Nasal     Status: None   Collection Time: 01/22/23 11:58 PM   Specimen: Nasal Mucosa; Nasal Swab  Result Value Ref Range Status   MRSA by PCR Next Gen NOT DETECTED NOT DETECTED Final    Comment: (NOTE) The GeneXpert MRSA Assay (FDA approved for NASAL specimens only), is one component of a comprehensive MRSA colonization surveillance program. It is not intended to diagnose MRSA infection nor to guide or monitor treatment for MRSA infections. Test performance is not FDA approved in patients less than 28 years old. Performed at [facility], [place] [address], [place], [place] [address]   Culture, blood (Routine X 2) w Reflex to ID Panel     Status: None   Collection Time: 01/24/23 10:59 AM   Specimen: BLOOD LEFT ARM  Result Value Ref Range Status   Specimen Description BLOOD LEFT ARM  Final   Special Requests   Final    BOTTLES DRAWN AEROBIC ONLY Blood Culture adequate volume   Culture   Final    NO GROWTH 5 DAYS Performed at [facility], [place] [address], [place], [place] [address]    Report Status 01/29/2023 FINAL  Final  Culture, blood (Routine X 2) w Reflex to ID Panel     Status: None   Collection Time: 01/24/23  7:21 PM   Specimen: BLOOD LEFT HAND  Result Value Ref Range Status   Specimen Description BLOOD LEFT HAND  Final   Special Requests IN PEDIATRIC BOTTLE Blood Culture adequate volume  Final   Culture   Final    NO GROWTH 5 DAYS Performed at [facility], [place] [address], [place],  [address]    Report Status 01/29/2023 FINAL  Final    Scheduled Meds:  (feeding supplement) PROSource Plus  30 mL Oral BID BM   acetaminophen  1,000 mg Oral Q8H   aspirin EC   81 mg Oral Daily   atorvastatin  40 mg Oral Daily   Chlorhexidine Gluconate Cloth  6 each Topical Q0600   darbepoetin (ARANESP) injection - DIALYSIS  100 mcg Subcutaneous Q Fri-1800   feeding supplement  237 mL Oral TID BM   folic acid  1 mg Oral Daily   gabapentin  100 mg Oral TID   heparin  5,000 Units Subcutaneous Q8H   isosorbide dinitrate  5 mg Oral BID  leptospermum manuka honey  1 Application Topical Daily   midodrine  10 mg Oral Q M,W,F-HD   multivitamin  1 tablet Oral QHS   pantoprazole  40 mg Oral Daily   sodium hypochlorite   Irrigation BID   Continuous Infusions:  sodium chloride Stopped (02/01/23 2102)   ceFEPime (MAXIPIME) IV 1 g (02/02/23 2004)    Principal Problem:   Septic shock due to E coli and Morganella bacteremia due to coccygeal osteomyelitis due to sacral ulcer Active Problems:   Chronic osteomyelitis of coccyx (HCC)   Diabetes mellitus type 2 in obese (HCC)   Iron deficiency anemia   End stage renal disease on dialysis (HCC)   Malnutrition of moderate degree   Acute metabolic encephalopathy   LOS: 12 days   A & P  Septic shock due to E coli and Morganella bacteremia due to coccygeal osteomyelitis due to sacral ulcer See below.  Chronic osteomyelitis of coccyx ([place]) Seen by ID and General Surgery.  No debridement would be helpful.  ID note that coccygeal osteo is not curable and patient is high risk for recurrent infection and readmission for this.  He has completed 7 days Flagyl and is on day 9 of 14 of Cefepime EOT 2/15 - Continue cefepime - Continue midodrine - Follow up with ID after discharge - Continue oxycodone for pain - Dakins and medihoney on non-hydro days  We will continue hydrotherapy while inpatient, although per PT, the benefit appears to be low, and we will plan to stop hydrotherapy at discharge from acute inpatient care. Osteo will likely never be cured.  End stage renal disease on dialysis [facility]) - Consult nephrology for  HD _ May discharge to SNF when able to sit up for HD.  Acute metabolic encephalopathy Resolved.  E coli bacteremia See above  Infection of bloodstream concurrent with and due to presence of hemodialysis catheter ([place]) See above  Iron deficiency anemia Hgb stable - Continue Aranesp, folate _ Iron supplementation held due to active infection  Diabetes mellitus type 2 in obese (HCC) Glucose well controlled off insulin. - Continue aspirin and gabapentin and atorvastatin  Malnutrition of moderate degree As evidenced by loss of subcutaneous muscle mass and fat, weight loss >10 kg in last 8 months  DVT prophylaxis: Heparin Code Status: Full Code Family Communication: None available Disposition Plan: SNF    Zhion Pevehouse, DO [facility] Direct contact: see [email or web address]  7PM-7AM contact night coverage as above 02/04/2023, 1:46 PM  LOS: 12 days

## 2023-02-04 NOTE — Progress Notes (Signed)
Physical Therapy Wound Treatment Patient Details  Name: [name] MRN: [id number] Date of Birth: August 01, 1946  Today's Date: 02/04/2023 Time: 0817-0905 Time Calculation (min): 48 min  Subjective  Subjective Assessment Subjective: pt feeling better and agreeable to therapy Patient and Family Stated Goals: have less pain Date of Onset:  (pt states about 4 months ago) Prior Treatments: hydro therapy  Pain Score:  Pt with 8/10 pain despite PO pain medication prior to hydrotherapy.  Wound Assessment  Pressure Injury 01/23/23 Sacrum Mid Unstageable - Full thickness tissue loss in which the base of the injury is covered by slough (yellow, tan, gray, green or brown) and/or eschar (tan, brown or black) in the wound bed. (Active)  Wound Image   01/31/23 1400  Dressing Type Gauze (Comment);Honey;Barrier Film (skin prep);Foam - Lift dressing to assess site every shift 02/04/23 1500  Dressing Changed 02/04/23 1500  Dressing Change Frequency Daily 02/04/23 1500  State of Healing Non-healing 02/04/23 1500  Site / Wound Assessment Yellow;Painful;Pink;Bleeding;Red;Brown;Dusky;Friable 02/04/23 1500  % Wound base Red or Granulating 15% 02/04/23 1500  % Wound base Yellow/Fibrinous Exudate 75% 02/04/23 1500  % Wound base Black/Eschar 5% 02/04/23 1500  % Wound base Other/Granulation Tissue (Comment) 5% 02/04/23 1500  Peri-wound Assessment Black;Other (Comment);Maceration;Pink;Denuded 02/04/23 1500  Wound Length (cm) 8.2 cm 01/31/23 1400  Wound Width (cm) 6.3 cm 01/31/23 1400  Wound Depth (cm) 3.5 cm 01/31/23 1400  Wound Surface Area (cm^2) 51.66 cm^2 01/31/23 1400  Wound Volume (cm^3) 180.81 cm^3 01/31/23 1400  Tunneling (cm) yes 02/03/23 1454  Undermining (cm) entirity of the wound 12 oclock 3.2, 3 oclock 1.8, 6 oclock 2.2, 9 o'clock 2.4 01/31/23 1400  Margins Unattached edges (unapproximated) 02/04/23 1500  Drainage Amount Minimal 02/04/23 1500  Drainage Description Serosanguineous;Odor - foul  02/04/23 1500  Treatment Debridement (Selective);Irrigation 02/04/23 1500   Selective Debridement (non-excisional) Selective Debridement (non-excisional) - Location: Sacrum Selective Debridement (non-excisional) - Tools Used: Forceps, Scalpel, Scissors Selective Debridement (non-excisional) - Tissue Removed: gray black fibrin necrotic, tissue, from base of the wound and hardened black and yellow eschar around margin of the wound    Wound Assessment and Plan  Wound Therapy - Assess/Plan/Recommendations Wound Therapy - Clinical Statement: Pt with increased bleeding in the wound bed with removal of old dressing , but noted to have had a heparin bolus during HD yesterday.  PT able to remove moderate amount of necrotic tissue, but wound continues to progress. PT attempts to council on pt on the severity of his wound, pt reports he doesn't want to hear it but repeatedly asks PT to continue to come back to take care of his wound. PT will continue to work on cleaning out necrotic tissue from the wound but pt does not need hydrotherapy to return to SNF. Wound Therapy - Functional Problem List: decreased mobility Factors Delaying/Impairing Wound Healing: Diabetes Mellitus, Infection - systemic/local, Multiple medical problems, Immobility, Incontinence Hydrotherapy Plan: Debridement, Dressing change, Patient/family education Wound Therapy - Frequency:  (2x/wk) Wound Therapy - Current Recommendations: WOC nurse Wound Therapy - Follow Up Recommendations: dressing changes by RN  Wound Therapy Goals- Improve the function of patient's integumentary system by progressing the wound(s) through the phases of wound healing (inflammation - proliferation - remodeling) by: Wound Therapy Goals - Improve the function of patient's integumentary system by progressing the wound(s) through the phases of wound healing by: Decrease Necrotic Tissue to: 80 Decrease Necrotic Tissue - Progress: Not progressing Increase  Granulation Tissue to: 20 Increase Granulation Tissue - Progress: Met  Additional Wound Therapy Goal - Progress: Not progressing Goals/treatment plan/discharge plan were made with and agreed upon by patient/family: Yes Time For Goal Achievement: 7 days Wound Therapy - Potential for Goals: Poor  Goals will be updated until maximal potential achieved or discharge criteria met.  Discharge criteria: when goals achieved, discharge from hospital, MD decision/surgical intervention, no progress towards goals, refusal/missing three consecutive treatments without notification or medical reason.  GP     Charges PT Wound Care Charges $Wound Debridement up to 20 cm: < or equal to 20 cm $ Wound Debridement each add'l 20 sqcm: 2 $PT Hydrotherapy Visit: 1 Visit      Eudell Julian B. Migdalia Dk PT, DPT Acute Rehabilitation Services Please use secure chat or  Call Office [phone]  [place] 02/04/2023, 4:03 PM

## 2023-02-04 NOTE — Progress Notes (Signed)
Contacted inpt HD unit this morning and advised pt was able to tolerate HD in the chair yesterday. Update provided to RN CM. Will assist as needed.   Melven Sartorius Renal Navigator [phone]

## 2023-02-04 NOTE — TOC Progression Note (Addendum)
Transition of Care [facility]) - Progression Note    Patient Details  Name: [name] MRN: [id number] Date of Birth: [birth year]  [facility] of [facility]) CM/SW [place], RN Phone Number: 02/04/2023, 11:26 AM  Clinical Narrative:    CM called and spoke with Melven Sartorius, Renal navigator and the patient was able to tolerate sitting for dialysis sessions.  I called [facility] and spoke with Farrel Gordon, CM at the facility and she is holding a bed for the patient to return to at the facility once he is medically stable for discharge back to the facility.  I sent a message to the attending MD that patient would need to be transitioned to wound care without hydrotherapy to discharge back to the facility since the SNF is unable to provide hydrotherapy wound care.  Insurance authorization will be started once patient can discharge back to the SNF.    Insurance authorization was started through [employer] - pending approval.  The patient will likely discharge back to [facility] once medically stable for discharge. Mounce, Renal navigator was updated as well.   Expected Discharge Plan: [place] Barriers to Discharge: Continued Medical Work up  Expected Discharge Plan and Services   Discharge Planning Services: CM Consult Post Acute Care Choice: [place] (SNF versus LTAC) Living arrangements for the past 2 months: Single Family Home                                       Social Determinants of Health (SDOH) Interventions SDOH Screenings   Food Insecurity: No Food Insecurity (01/05/2023)  Housing: Low Risk  (12/17/2022)  Transportation Needs: No Transportation Needs (12/17/2022)  Utilities: Not At Risk (12/17/2022)  Tobacco Use: Medium Risk (01/28/2023)    Readmission Risk Interventions    01/30/2023    4:23 PM 01/20/2023   10:26 AM 09/16/2022   12:31 PM  Readmission Risk Prevention Plan   Transportation Screening Complete Complete Complete  Medication Review Press photographer) Complete Complete Complete  PCP or Specialist appointment within 3-5 days of discharge Complete Complete   HRI or Home Care Consult  Complete Complete  SW Recovery Care/Counseling Consult Complete Complete Complete  Palliative Care Screening Complete Complete Not Applicable  [place] Complete Complete Not Applicable

## 2023-02-04 NOTE — Progress Notes (Signed)
Patient ID: [name], male   DOB: [birth year], 77 y.o.   MRN: [id number] [facility] Progress Note   Assessment/ Plan:   1.  Septic shock: Secondary to E. 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  Weaned off of pressors with completion of intravenous metronidazole and remains on antimicrobial therapy with intravenous cefepime.  Ongoing care of wound and wants to continue full spectrum of care at this time. 2. ESRD: Continue hemodialysis on a Monday/Wednesday/Friday schedule with next hemodialysis treatment ordered for tomorrow.  3. Anemia: Secondary to chronic illness and exacerbated by acute disease/hospitalization.  Continue ESA. 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 5. Nutrition: Significant malnutrition noted, likely complicated by sacral decubitus/sepsis.  Continue efforts at protein supplementation and infection/wound management. 6. Hypotension: Off pressors, remains on midodrine to facilitate dialysis.  Subjective:   Denies any acute events overnight and reports tolerable pain over his lower back.   Objective:   BP (!) 142/126 (BP Location: Right Wrist)   Pulse 82   Temp 98.2 F (36.8 C) (Oral)   Resp 18   Ht 6' 3"$  (1.905 m)   Wt 95.7 kg   SpO2 100%   BMI 26.37 kg/m   Physical Exam: Gen: Cachectic appearing man, appears comfortable resting in bed CVS: Pulse regular rhythm, normal rate, S1 and S2 normal Resp: Clear to auscultation bilaterally, no distinct rales or rhonchi.  Right IJ TDC Abd: Soft, flat, nontender, bowel sounds normal Ext: No lower extremity edema.    Labs: BMET Recent Labs  Lab 01/29/23 0826 01/30/23 0342 01/31/23 0409 02/01/23 0226 02/02/23 1134 02/03/23 1730 02/04/23 0305  NA 134* 134* 133* 134* 133* 134* 134*  K 3.5 4.4 4.0 4.1 3.4* 4.0 4.2  CL 97* 97* 96* 98 96* 97* 98  CO2 25 21* 22 23 25 23 23  $ GLUCOSE 114* 95 107* 85 94 119* 101*  BUN  48* 40* 61* 67* 43* 64* 48*  CREATININE 3.29* 2.67* 3.45* 3.93* 2.86* 4.24* 3.46*  CALCIUM 9.0 9.0 9.2 9.2 9.2 9.8 9.5  PHOS 3.3 2.7 2.7 3.3 3.4 4.2 3.8   CBC Recent Labs  Lab 01/30/23 0342 01/31/23 1435 02/01/23 0226 02/03/23 1750  WBC 11.8* 17.3* 14.2* 17.4*  HGB 9.7* 9.1* 8.2* 8.5*  HCT 30.4* 28.6* 24.9* 27.4*  MCV 97.7 100.4* 98.0 101.9*  PLT 182 440* 362 475*     Medications:     (feeding supplement) PROSource Plus  30 mL Oral BID BM   acetaminophen  1,000 mg Oral Q8H   aspirin EC  81 mg Oral Daily   atorvastatin  40 mg Oral Daily   Chlorhexidine Gluconate Cloth  6 each Topical Q0600   darbepoetin (ARANESP) injection - DIALYSIS  100 mcg Subcutaneous Q Fri-1800   feeding supplement  237 mL Oral TID BM   folic acid  1 mg Oral Daily   gabapentin  100 mg Oral TID   heparin  5,000 Units Subcutaneous Q8H   isosorbide dinitrate  5 mg Oral BID   leptospermum manuka honey  1 Application Topical Daily   midodrine  10 mg Oral Q M,W,F-HD   multivitamin  1 tablet Oral QHS   pantoprazole  40 mg Oral Daily   sodium hypochlorite   Irrigation BID   Elmarie Shiley, MD 02/04/2023, 8:06 AM

## 2023-02-05 DIAGNOSIS — A419 Sepsis, unspecified organism: Secondary | ICD-10-CM | POA: Diagnosis not present

## 2023-02-05 DIAGNOSIS — R6521 Severe sepsis with septic shock: Secondary | ICD-10-CM | POA: Diagnosis not present

## 2023-02-05 LAB — RENAL FUNCTION PANEL
Albumin: 1.7 g/dL — ABNORMAL LOW (ref 3.5–5.0)
Anion gap: 13 (ref 5–15)
BUN: 71 mg/dL — ABNORMAL HIGH (ref 8–23)
CO2: 23 mmol/L (ref 22–32)
Calcium: 9.5 mg/dL (ref 8.9–10.3)
Chloride: 100 mmol/L (ref 98–111)
Creatinine, Ser: 4.43 mg/dL — ABNORMAL HIGH (ref 0.61–1.24)
GFR, Estimated: 13 mL/min — ABNORMAL LOW (ref >60)
Glucose, Bld: 111 mg/dL — ABNORMAL HIGH (ref 70–99)
Phosphorus: 4.6 mg/dL (ref 2.5–4.6)
Potassium: 4.5 mmol/L (ref 3.5–5.1)
Sodium: 136 mmol/L (ref 135–145)

## 2023-02-05 LAB — CBC
HCT: 24.5 % — ABNORMAL LOW (ref 39.0–52.0)
Hemoglobin: 7.8 g/dL — ABNORMAL LOW (ref 13.0–17.0)
MCH: 32.1 pg (ref 26.0–34.0)
MCHC: 31.8 g/dL (ref 30.0–36.0)
MCV: 100.8 fL — ABNORMAL HIGH (ref 80.0–100.0)
Platelets: 434 10*3/uL — ABNORMAL HIGH (ref 150–400)
RBC: 2.43 MIL/uL — ABNORMAL LOW (ref 4.22–5.81)
RDW: 19.3 % — ABNORMAL HIGH (ref 11.5–15.5)
WBC: 15.3 10*3/uL — ABNORMAL HIGH (ref 4.0–10.5)
nRBC: 0 % (ref 0.0–0.2)

## 2023-02-05 LAB — GLUCOSE, CAPILLARY
Glucose-Capillary: 114 mg/dL — ABNORMAL HIGH (ref 70–99)
Glucose-Capillary: 99 mg/dL (ref 70–99)
Glucose-Capillary: 101 mg/dL — ABNORMAL HIGH (ref 70–99)

## 2023-02-05 MED ORDER — HEPARIN SODIUM (PORCINE) 1000 UNIT/ML IJ SOLN
INTRAMUSCULAR | Status: AC
  Administered 2023-02-05: 5000 [IU]
  Filled 2023-02-05: qty 5

## 2023-02-05 MED ORDER — HEPARIN SODIUM (PORCINE) 1000 UNIT/ML DIALYSIS
40.0000 [IU]/kg | INTRAMUSCULAR | Status: DC | PRN
  Administered 2023-02-05: 2500 [IU] via INTRAVENOUS_CENTRAL
  Filled 2023-02-05: qty 4

## 2023-02-05 NOTE — Progress Notes (Signed)
TX duration:  Patient tolerated well.  Transported back to the room  Alert, without acute distress.  Hand-off given to patient's nurse.   Access used: catheter  Access issues: n/a  Total UF removed: 1L Medication(s) given: Oxy, Midrodrine    02/05/23 1255  Vitals  Temp 98.3 F (36.8 C)  Temp Source Oral  BP 132/66  MAP (mmHg) 84  BP Location Left Arm  BP Method Automatic  Patient Position (if appropriate) Lying  Pulse Rate 90  Pulse Rate Source Monitor  ECG Heart Rate 90  Resp 18  Oxygen Therapy  SpO2 98 %  O2 Device Room Air  During Treatment Monitoring  Blood Flow Rate (mL/min) 400 mL/min  Arterial Pressure (mmHg) -230 mmHg  Venous Pressure (mmHg) 120 mmHg  TMP (mmHg) 15 mmHg  Ultrafiltration Rate (mL/min) 541 mL/min  Dialysate Flow Rate (mL/min) 300 ml/min  HD Safety Checks Performed Yes  Intra-Hemodialysis Comments Tx completed  Post Treatment  Dialyzer Clearance Lightly streaked  Duration of HD Treatment -hour(s) 3.5 hour(s)  Liters Processed 72.9  Fluid Removed (mL) 1000 mL  Tolerated HD Treatment Yes  Hemodialysis Catheter Right Subclavian Double lumen Permanent (Tunneled)  Placement Date/Time: 01/27/23 1658   Serial / Lot #: [id number]  Expiration Date: 06/25/27  Time Out: Correct patient;Correct site;Correct procedure  Maximum sterile barrier precautions: Hand hygiene;Cap;Mask;Sterile gown;Sterile gloves;Large sterile ...  Site Condition No complications  Blue Lumen Status Heparin locked  Red Lumen Status Heparin locked  Catheter fill solution Heparin 1000 units/ml  Catheter fill volume (Arterial) 1.9 cc  Catheter fill volume (Venous) 1.9  Dressing Type Transparent  Dressing Status Antimicrobial disc in place  Interventions New dressing  Dressing Change Due 02/12/23  Post treatment catheter status Capped and [facility]

## 2023-02-05 NOTE — TOC Progression Note (Addendum)
Transition of Care [facility]) - Progression Note    Patient Details  Name: [name] MRN: [id number] Date of Birth: 05-02-1946  Transition of Care [facility]) CM/SW [place], RN Phone Number: 02/05/2023, 2:07 PM  Clinical Narrative:    CM called and left a message with Claiborne Billings, CM at [facility] for discuss patient's need to return to SNF for continued wound care.  At this time - insurance authorization is still pending and in review.  02/05/2023 1451 - CM sent a secure message to Shanon Rosser, Financial Counselor supervisor and asked for assistance for LTC Medicaid application.  I called and spoke with the patient's brother's wife, Corban Yother, by phone and she states that the patient lives alone and the patient's niece in the area was only checking on the patient in the home periodically.  The patient does not have any family that he can live with to provide care per the family.  The patient's brother, [name] has tried in the past to assist the patient with Medicaid eligibility and he has been declined.  [employer] is currently reviewing the patient at this time for insurance authorization for SNF placement - insurance is pending only.  02/05/2023 - I spoke with CM with insurance company and they have requested additional clinicals be provided to support need for SNF placement for daily wound care.  Additional clinicals including Hydrotherapy documentation from PT on 2/13 and progress notes from MD were uploaded in the [place] [facility] to be reviewed by the insurance provider.   Expected Discharge Plan: [place] Barriers to Discharge: Continued Medical Work up  Expected Discharge Plan and Services   Discharge Planning Services: CM Consult Post Acute Care Choice: [place] (SNF versus LTAC) Living arrangements for the past 2 months: Single Family Home                                       Social Determinants  of Health (SDOH) Interventions SDOH Screenings   Food Insecurity: No Food Insecurity (01/05/2023)  Housing: Low Risk  (12/17/2022)  Transportation Needs: No Transportation Needs (12/17/2022)  Utilities: Not At Risk (12/17/2022)  Tobacco Use: Medium Risk (01/28/2023)    Readmission Risk Interventions    01/30/2023    4:23 PM 01/20/2023   10:26 AM 09/16/2022   12:31 PM  Readmission Risk Prevention Plan  Transportation Screening Complete Complete Complete  Medication Review Press photographer) Complete Complete Complete  PCP or Specialist appointment within 3-5 days of discharge Complete Complete   HRI or Home Care Consult  Complete Complete  SW Recovery Care/Counseling Consult Complete Complete Complete  Palliative Care Screening Complete Complete Not Applicable  [place] Complete Complete Not Applicable

## 2023-02-05 NOTE — Progress Notes (Signed)
PROGRESS NOTE    [name]  [id number] DOB: April 14, 1946 DOA: 01/22/2023 PCP: Redmond School, MD   Brief Narrative: [name] is a 77 y.o. M with ESRD on HD MWF, CAD, DM, sCHF previous EF 30-35% now recovered, recent episode of chronic cholecystitis and then prolonged hospitalization last month for NSTEMI complicated by infected sacral decubitus ulcer who was brought from dialysis due to hypotension.  -8/6-8/19/23: Admitted for fluid overload, started HD, discharged to SNF - 8/28-08/23/22: Readmitted to [facility] for epigastric pain, found to have cholecystitis, IR placed cholecystostomy tube -9/22-9/25/23: Readmitted to [facility] for hypotension, hypoglycemia  -09/24/22: Sacral pressure injury noted by General Surgery, already had been discharged from rehab and patient reporting to Gen Surg "He is not able to walk and he says he wants to go back to a rehab to get stronger."   -12/12/22-01/21/23: Presented for elective cholecystectomy, found during preop eval to have diffuse ECG changes, transferred to [facility] for left heart cath; able to have lap chole on 12/28, then hospitalization then prolonged by infection of sacral decubitus ulcer, now stage IV, requiring IV antibiotics  This admission: Admitted by CCM  01/22/23: Found to be hypotensive during first HD session post-discharge, sent back to hospital and admitted to ICU on pressors 2/1: Blood cultures growing E coli and Morganella; ID and Nephrology consulted; tunneled line removed; CT abdomen shows sacral ulcer with coccygeal osteomyelitis 2/03 Transfer to [facility] 2/5: Tunneled line replaced 2/6-present: Awaiting placement 02/04/2023: No new complaints. The patient will need to complete his antibiotics here as facility will not be able to give them IV. He will complete his antibiotics on Thursday.   Assessment & Plan:   Principal Problem:   Septic shock due to E coli and Morganella bacteremia due to coccygeal osteomyelitis due to sacral ulcer Active  Problems:   Chronic osteomyelitis of coccyx (HCC)   Diabetes mellitus type 2 in obese (HCC)   Iron deficiency anemia   End stage renal disease on dialysis (HCC)   Malnutrition of moderate degree   Acute metabolic encephalopathy  Septic shock due to E coli and Morganella bacteremia due to coccygeal osteomyelitis due to sacral ulcer Chronic osteomyelitis of coccyx ([place]) Admitted to ICU, required IV pressors.  Seen by ID and General Surgery.  No debridement would be helpful. ID note that coccygeal osteo is not curable and patient is high risk for recurrent infection and readmission for this.  He has completed 7 days Flagyl and is on Cefepime EOT 2/15.  - Continue cefepime - Continue midodrine - Follow up with ID after discharge - Continue oxycodone for pain - Dakins and medihoney on non-hydro days  Plan to stop hydrotherapy at discharge from acute inpatient care. Osteo will likely never be cured.   End stage renal disease on dialysis [facility]) - Consult nephrology for HD _ May discharge to SNF when able to sit up for HD. He has been getting HD in recliner.    Acute metabolic encephalopathy Resolved.   E coli bacteremia See above   Infection of bloodstream concurrent with and due to presence of hemodialysis catheter ([place]) See above   Iron deficiency anemia Hgb stable - Continue Aranesp, folate _ Iron supplementation held due to active infection   Diabetes mellitus type 2 in obese (HCC) Glucose well controlled off insulin. - Continue aspirin and gabapentin and atorvastatin   Severe Malnutrition.  As evidenced by loss of subcutaneous muscle mass and fat, weight loss >10 kg in last 8 months On ensure.  Pressure Injury 01/23/23 Sacrum Mid Unstageable - Full thickness tissue loss in which the base of the injury is covered by slough (yellow, tan, gray, green or brown) and/or eschar (tan, brown or black) in the wound bed. (Active)  01/23/23 0000  Location: Sacrum  Location  Orientation: Mid  Staging: Unstageable - Full thickness tissue loss in which the base of the injury is covered by slough (yellow, tan, gray, green or brown) and/or eschar (tan, brown or black) in the wound bed.  Wound Description (Comments):   Present on Admission: Yes  Dressing Type Foam - Lift dressing to assess site every shift;Gauze (Comment);Honey 02/04/23 2336     Nutrition Problem: Severe Malnutrition Etiology: chronic illness (ESRD on HD)    Signs/Symptoms: severe fat depletion, severe muscle depletion Percent weight loss: 33 % (x 6 months)    Interventions: Refer to RD note for recommendations, Ensure Enlive (each supplement provides 350kcal and 20 grams of protein), MVI  Estimated body mass index is 26.37 kg/m as calculated from the following:   Height as of this encounter: 6' 3"$  (1.905 m).   Weight as of this encounter: 95.7 kg.   DVT prophylaxis:  Code Status:  Family Communication: Disposition Plan:  Status is: Inpatient Remains inpatient appropriate because: SNF when complete antibiotics.     Consultants:  Interventional Radiology Palliative Care Infectious Disease Wound Care  Procedures:  01/27/2023 Tunnelled HD Catheter placed by IR HD  Antimicrobials:  IV cefepime   Subjective: Seen in HD, no new complaints, other than he has not eating today    Objective: Vitals:   02/05/23 1130 02/05/23 1200 02/05/23 1230 02/05/23 1255  BP: 132/71 113/71 129/66 132/66  Pulse:    90  Resp:    18  Temp:    98.3 F (36.8 C)  TempSrc:    Oral  SpO2:    98%  Weight:      Height:        Intake/Output Summary (Last 24 hours) at 02/05/2023 1334 Last data filed at 02/05/2023 1255 Gross per 24 hour  Intake --  Output 1000 ml  Net -1000 ml   Filed Weights   02/01/23 0500 02/02/23 0455 02/04/23 0427  Weight: 97.5 kg 97 kg 95.7 kg    Examination:  General exam: chronic ill appearing cachetic.  Respiratory system: Clear to auscultation. Respiratory  effort normal. Cardiovascular system: S1 & S2 heard, RRR.  Gastrointestinal system: Abdomen is nondistended, soft and nontender. No organomegaly or masses felt. Normal bowel sounds heard. Central nervous system: Alert and oriented.  Extremities: no edema  Data Reviewed: I have personally reviewed following labs and imaging studies  CBC: Recent Labs  Lab 01/31/23 1435 02/01/23 0226 02/03/23 1750 02/04/23 0305 02/05/23 0928  WBC 17.3* 14.2* 17.4* 15.6* 15.3*  NEUTROABS  --   --   --  12.0*  --   HGB 9.1* 8.2* 8.5* 8.5* 7.8*  HCT 28.6* 24.9* 27.4* 26.4* 24.5*  MCV 100.4* 98.0 101.9* 98.9 100.8*  PLT 440* 362 475* 446* [id number]*   Basic Metabolic Panel: Recent Labs  Lab 02/01/23 0226 02/02/23 1134 02/03/23 1730 02/04/23 0305 02/05/23 0927  NA 134* 133* 134* 134* 136  K 4.1 3.4* 4.0 4.2 4.5  CL 98 96* 97* 98 100  CO2 23 25 23 23 23  $ GLUCOSE 85 94 119* 101* 111*  BUN 67* 43* 64* 48* 71*  CREATININE 3.93* 2.86* 4.24* 3.46* 4.43*  CALCIUM 9.2 9.2 9.8 9.5 9.5  PHOS 3.3 3.4 4.2 3.8  4.6   GFR: Estimated Creatinine Clearance: 16.7 mL/min (A) (by C-G formula based on SCr of 4.43 mg/dL (H)). Liver Function Tests: Recent Labs  Lab 02/01/23 0226 02/02/23 1134 02/03/23 1730 02/04/23 0305 02/05/23 0927  ALBUMIN 1.6* 1.7* 1.9* 1.8* 1.7*   No results for input(s): "LIPASE", "AMYLASE" in the last 168 hours. No results for input(s): "AMMONIA" in the last 168 hours. Coagulation Profile: No results for input(s): "INR", "PROTIME" in the last 168 hours. Cardiac Enzymes: No results for input(s): "CKTOTAL", "CKMB", "CKMBINDEX", "TROPONINI" in the last 168 hours. BNP (last 3 results) No results for input(s): "PROBNP" in the last 8760 hours. HbA1C: No results for input(s): "HGBA1C" in the last 72 hours. CBG: Recent Labs  Lab 02/04/23 0515 02/04/23 0747 02/04/23 1139 02/04/23 1603 02/05/23 0740  GLUCAP 89 87 110* 126* 114*   Lipid Profile: No results for input(s): "CHOL", "HDL",  "LDLCALC", "TRIG", "CHOLHDL", "LDLDIRECT" in the last 72 hours. Thyroid Function Tests: No results for input(s): "TSH", "T4TOTAL", "FREET4", "T3FREE", "THYROIDAB" in the last 72 hours. Anemia Panel: No results for input(s): "VITAMINB12", "FOLATE", "FERRITIN", "TIBC", "IRON", "RETICCTPCT" in the last 72 hours. Sepsis Labs: No results for input(s): "PROCALCITON", "LATICACIDVEN" in the last 168 hours.  No results found for this or any previous visit (from the past 240 hour(s)).       Radiology Studies: No results found.      Scheduled Meds:  (feeding supplement) PROSource Plus  30 mL Oral BID BM   acetaminophen  1,000 mg Oral Q8H   aspirin EC  81 mg Oral Daily   atorvastatin  40 mg Oral Daily   Chlorhexidine Gluconate Cloth  6 each Topical Q0600   darbepoetin (ARANESP) injection - DIALYSIS  100 mcg Subcutaneous Q Fri-1800   feeding supplement  237 mL Oral TID BM   folic acid  1 mg Oral Daily   gabapentin  100 mg Oral TID   heparin  5,000 Units Subcutaneous Q8H   isosorbide dinitrate  5 mg Oral BID   leptospermum manuka honey  1 Application Topical Daily   midodrine  10 mg Oral Q M,W,F-HD   multivitamin  1 tablet Oral QHS   pantoprazole  40 mg Oral Daily   sodium hypochlorite   Irrigation BID   Continuous Infusions:  sodium chloride Stopped (02/01/23 2102)   ceFEPime (MAXIPIME) IV 1 g (02/04/23 1957)     LOS: 14 days    Time spent: 35 minutes    Lavan Imes A Michell Kader, MD [facility]   If 7PM-7AM, please contact night-coverage [email or web address]  02/05/2023, 1:34 PM

## 2023-02-05 NOTE — Procedures (Signed)
Patient seen on Hemodialysis while in a recliner. BP (!) 116/9 (BP Location: Left Arm)   Pulse 86   Temp 98.8 F (37.1 C) (Axillary)   Resp 15   Ht 6' 3"$  (1.905 m)   Wt 95.7 kg   SpO2 98%   BMI 26.37 kg/m   QB 400, UF goal 1L Tolerating treatment without complaints at this time.   Elmarie Shiley MD [facility]. Office # [phone] Pager # [phone] 9:58 AM

## 2023-02-06 DIAGNOSIS — A419 Sepsis, unspecified organism: Secondary | ICD-10-CM | POA: Diagnosis not present

## 2023-02-06 DIAGNOSIS — N186 End stage renal disease: Secondary | ICD-10-CM | POA: Diagnosis not present

## 2023-02-06 DIAGNOSIS — R6521 Severe sepsis with septic shock: Secondary | ICD-10-CM | POA: Diagnosis not present

## 2023-02-06 DIAGNOSIS — E440 Moderate protein-calorie malnutrition: Secondary | ICD-10-CM | POA: Diagnosis not present

## 2023-02-06 DIAGNOSIS — S31000D Unspecified open wound of lower back and pelvis without penetration into retroperitoneum, subsequent encounter: Secondary | ICD-10-CM

## 2023-02-06 DIAGNOSIS — M4628 Osteomyelitis of vertebra, sacral and sacrococcygeal region: Secondary | ICD-10-CM | POA: Diagnosis not present

## 2023-02-06 LAB — GLUCOSE, CAPILLARY
Glucose-Capillary: 90 mg/dL (ref 70–99)
Glucose-Capillary: 87 mg/dL (ref 70–99)
Glucose-Capillary: 108 mg/dL — ABNORMAL HIGH (ref 70–99)
Glucose-Capillary: 110 mg/dL — ABNORMAL HIGH (ref 70–99)

## 2023-02-06 MED ORDER — DARBEPOETIN ALFA 200 MCG/0.4ML IJ SOSY
200.0000 ug | PREFILLED_SYRINGE | INTRAMUSCULAR | Status: DC
  Administered 2023-02-07: 200 ug via SUBCUTANEOUS
  Filled 2023-02-06: qty 0.4

## 2023-02-06 MED ORDER — HEPARIN SODIUM (PORCINE) 1000 UNIT/ML DIALYSIS
40.0000 [IU]/kg | INTRAMUSCULAR | Status: DC | PRN
  Administered 2023-02-07: 3800 [IU] via INTRAVENOUS_CENTRAL

## 2023-02-06 NOTE — Progress Notes (Signed)
Patient ID: [name], male   DOB: 10/09/1946, 77 y.o.   MRN: [id number]    Progress Note from the Palliative Medicine Team at [facility]   Patient Name: [name]        Date: 02/06/2023 DOB: 1946/05/01  Age: 77 y.o. MRN#: [id number] Attending Physician: Shelly Coss, MD Primary Care Physician: Redmond School, MD Admit Date: 01/22/2023   Medical records reviewed   77 y.o. male with a history of recent failure to thrive, ESRD (HD MWF), multivessel CAD, T2DM, HFrEF, PAFprolonged admission, bedbound with large tunneling sacral decubitus ulcer who was discharged to SNF 1/30 only to return 1/31 with septic shock due to E. coli bacteremia, Morganella also growing on cultures, coccygeal osteomyelitis causing metabolic encephalopathy. Antibiotics, guided by ID, have been given with improvement, liberation from pressors. HD catheter removed pending blood culture clearance. If repeat cultures negative on 2/5, will replace catheter in IR and restart routine HD.   HD cath replaced  Multiple SNF stays over the past several months, continued physical, functional and cognitive decline over the past six months per family     This NP assessed patient at the bedside as a follow up for palliative medicine needs and emotional support.    [name] has on multiple occasions expressed his refusal to discuss any kind of advance care planning.    Today I reiterated the importance of conversation with his family and his healthcare providers outlining his goals of care and wishes thereby enhancing patient centered care and the finding treatment care options.   He acknowledges the information.   I spoke with Mia Creek RN/ transition of care team, [name] is working with her to assist with possible discharge options.  Hopefully family will be enabled and allowed to assist [name] with the logistics.    Plan of Care: -Full Code  -Patient is open to all offered and available medical  interventions to prolong life    6 minutes   Wadie Lessen NP  Palliative Medicine Team Team Phone # [phone] Pager [phone]

## 2023-02-06 NOTE — Consult Note (Signed)
   [facility] CM Inpatient Consult   02/06/2023  [name] August 23, 1946 [id number]  [place] [employer] [ACO] Patient: [name] Medicare  Primary Care Provider:  Redmond School, MD   Patient screened for less than 7 days readmission hospitalization with noted extreme high risk score for unplanned readmission risk length of stay and to assess for potential [place] Management service needs for post hospital transition for care coordination.  Review of patient's electronic medical record reveals patient is being recommended for a skilled nursing facility level of care.   Plan:  Collaborating with inpatient [facility] RNCM regarding post hospital needs.  Awaiting insurance approval for needs. Spoke with [facility] team member regarding long term care needs is a potential.  Continue to follow progress and disposition to assess for post hospital community care coordination/management needs.  Referral request for community care coordination: if patient goes to a [facility] affiliated SNF facility will request [facility] RN to follow for returning to community, if applicable.    Of note, [facility] Care Management/Population Health does not replace or interfere with any arrangements made by the Inpatient Transition of Care team.  For questions contact:   Natividad Brood, RN BSN [place]  [phone] business mobile phone Toll free office [phone]  *[place]  [phone] Fax number: [phone] [email or web address]

## 2023-02-06 NOTE — Progress Notes (Addendum)
PROGRESS NOTE  [name]  [id number] DOB: 02/09/1946 DOA: 01/22/2023 PCP: Redmond School, MD   Brief Narrative:  Patient is a 77 year old male with history of ESRD on dialysis On Monday, Wednesday, Friday, coronary artery disease, diabetes type 2, recent prolonged hospitalization for NSTEMI complicated by infected sacral decubitus ulcer,mostly bedbound who was brought from dialysis facility for further evaluation of hypotension.  On presentation he was found to be severely hypotensive and was admitted under ICU service for the management of septic shock.  Currently hemodynamically stable, waiting for placement.  Important event( including prior hospitalizations): 8/6-8/19/23: Admitted for fluid overload, started HD, discharged to SNF - 8/28-08/23/22: Readmitted to [facility] for epigastric pain, found to have cholecystitis, IR placed cholecystostomy tube -9/22-9/25/23: Readmitted to [facility] for hypotension, hypoglycemia  -09/24/22: Sacral pressure injury noted by General Surgery, already had been discharged from rehab and patient reporting to Gen Surg "He is not able to walk and he says he wants to go back to a rehab to get stronger."  -12/12/22-01/21/23: Presented for elective cholecystectomy, found during preop eval to have diffuse ECG changes, transferred to [facility] for left heart cath; able to have lap chole on 12/28, then hospitalization then prolonged by infection of sacral decubitus ulcer, now stage IV, requiring IV antibiotics -01/22/23: Found to be hypotensive during first HD session post-discharge, sent back to hospital and admitted to ICU on pressors -2/1: Blood cultures growing E coli and Morganella; ID and Nephrology consulted; tunneled line removed; CT abdomen shows sacral ulcer with coccygeal osteomyelitis -2/03 Transfer to [facility] -2/5: Tunneled line replaced -2/6-present: Awaiting placement -02/04/2023: No new complaints. The patient will need to complete his antibiotics here as facility will  not be able to give them IV. He will complete his antibiotics today.     Assessment & Plan:  Principal Problem:   Septic shock due to E coli and Morganella bacteremia due to coccygeal osteomyelitis due to sacral ulcer Active Problems:   Chronic osteomyelitis of coccyx (HCC)   Diabetes mellitus type 2 in obese (HCC)   Iron deficiency anemia   End stage renal disease on dialysis (HCC)   Malnutrition of moderate degree   Acute metabolic encephalopathy   Septic shock/gram-negative bacteremia: Presented with severe hypotension.  Blood cultures showed E. coli, Morganella.  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 Initially admitted on ICU service.  Required vasopressors.  Patient was seen by ID, general surgery.  Debridement was not recommended.  As per ID, coccygeal osteomyelitis is not curable and patient is high risk for recurrent infection and readmission.  Currently on  cefepime, plan to complete antibiotics today on 2/15. On midodrine for hypotension.  Patient will follow-up with ID after discharge. Patient still has mild leukocytosis.  Afebrile.  Sacral ulcer/chronic osteomyelitis of coccyx: Physical therapy following for hydrotherapy of the wound.  Will be a chronic issue.  ESRD on dialysis: Nephrology following for dialysis.  Plan for discharge to skilled nursing facility when able to set up for dialysis.  Has been getting dialysis in a recliner during this hospitalization.Also on calcitriol  Acute metabolic encephalopathy: Suspected to be from sepsis.  Currently resolved  Iron deficiency anemia: Currently hemoglobin stable in the range of 7-8.  Continue arenesp , folate.  Iron not supplemented due to sepsis  History of hyperlipidemia: On Lipitor.  Severe malnutrition: Nutritionist was following  Deconditioning/debility/goals of care: Patient is originally from SNF.  Mostly bedbound.TOC closely following.  Plan is to return to SNF for continued  wound  care.  Very poor quality of life, Palliaitve care also following for goals of care. I did peer to peer review with  his insurance today.  Waiting to hear from them.   Nutrition Problem: Severe Malnutrition Etiology: chronic illness (ESRD on HD) Pressure Injury 01/23/23 Sacrum Mid Unstageable - Full thickness tissue loss in which the base of the injury is covered by slough (yellow, tan, gray, green or brown) and/or eschar (tan, brown or black) in the wound bed. (Active)  01/23/23 0000  Location: Sacrum  Location Orientation: Mid  Staging: Unstageable - Full thickness tissue loss in which the base of the injury is covered by slough (yellow, tan, gray, green or brown) and/or eschar (tan, brown or black) in the wound bed.  Wound Description (Comments):   Present on Admission: Yes  Dressing Type Foam - Lift dressing to assess site every shift 02/05/23 2133    DVT prophylaxis:heparin injection 5,000 Units Start: 01/23/23 0600     Code Status: Full Code  Family Communication: None at bedside  Patient status:Inpatient  Patient is from :SNF  Anticipated discharge to:SNF  Estimated DC date:not sure,waiting insurance Auth   Consultants: PCCM,ID  Procedures:None  Antimicrobials:  Anti-infectives (From admission, onward)    Start     Dose/Rate Route Frequency Ordered Stop   01/27/23 1621  ceFAZolin (ANCEF) 2-4 GM/100ML-% IVPB       Note to Pharmacy: Leak, [name]: cabinet override      01/27/23 1621 01/27/23 1625   01/27/23 1000  metroNIDAZOLE (FLAGYL) IVPB 500 mg        500 mg 100 mL/hr over 60 Minutes Intravenous  Once 01/27/23 0852 01/27/23 1210   01/27/23 0800  ceFAZolin (ANCEF) IVPB 2g/100 mL premix        2 g 200 mL/hr over 30 Minutes Intravenous To Radiology 01/26/23 1123 01/27/23 0914   01/24/23 2200  metroNIDAZOLE (FLAGYL) tablet 500 mg        500 mg Oral Every 12 hours 01/24/23 1627 01/31/23 1227   01/24/23 2000  ceFEPIme (MAXIPIME) 1 g in sodium chloride 0.9 % 100 mL  IVPB        1 g 200 mL/hr over 30 Minutes Intravenous Every 24 hours 01/24/23 1627 02/06/23 2359   01/24/23 1800  vancomycin (VANCOCIN) IVPB 1000 mg/200 mL premix  Status:  Discontinued        1,000 mg 200 mL/hr over 60 Minutes Intravenous Every M-W-F (Hemodialysis) 01/22/23 1820 01/23/23 1057   01/23/23 2000  ceFEPIme (MAXIPIME) 2 g in sodium chloride 0.9 % 100 mL IVPB  Status:  Discontinued        2 g 200 mL/hr over 30 Minutes Intravenous Every 24 hours 01/22/23 1820 01/23/23 0102   01/23/23 0600  piperacillin-tazobactam (ZOSYN) IVPB 2.25 g  Status:  Discontinued        2.25 g 100 mL/hr over 30 Minutes Intravenous Every 8 hours 01/23/23 0119 01/24/23 1627   01/22/23 1815  ceFEPIme (MAXIPIME) 2 g in sodium chloride 0.9 % 100 mL IVPB        2 g 200 mL/hr over 30 Minutes Intravenous  Once 01/22/23 1804 01/22/23 1944   01/22/23 1815  metroNIDAZOLE (FLAGYL) IVPB 500 mg        500 mg 100 mL/hr over 60 Minutes Intravenous  Once 01/22/23 1804 01/22/23 2005   01/22/23 1815  vancomycin (VANCOCIN) IVPB 1000 mg/200 mL premix        1,000 mg 200 mL/hr over 60 Minutes Intravenous  Once 01/22/23 1804 01/22/23 2107       Subjective: Patient seen and examined at bedside today.  Hemodynamically stable overall comfortable, lying in bed.  Bedbound, very weak, deconditioned, not in any distress.  Denies any new complaints Objective: Vitals:   02/05/23 1605 02/05/23 2021 02/06/23 0526 02/06/23 0736  BP: (!) 151/70 107/62 112/66 108/61  Pulse: 88 86 98 94  Resp: 18 17 17   $ Temp: 98.3 F (36.8 C) 98.2 F (36.8 C) 98.6 F (37 C) (!) 97.4 F (36.3 C)  TempSrc: Oral Oral  Oral  SpO2: 100% 98% 97% 97%  Weight:      Height:        Intake/Output Summary (Last 24 hours) at 02/06/2023 0814 Last data filed at 02/06/2023 0437 Gross per 24 hour  Intake 200 ml  Output 1000 ml  Net -800 ml   Filed Weights   02/01/23 0500 02/02/23 0455 02/04/23 0427  Weight: 97.5 kg 97 kg 95.7 kg     Examination:  General exam: Deconditioned, debilitated HEENT: PERRL Respiratory system:  no wheezes or crackles  Cardiovascular system: S1 & S2 heard, RRR.  Dialysis catheter on the right chest Gastrointestinal system: Abdomen is nondistended, soft and nontender. Central nervous system: Alert and oriented Extremities: No edema, no clubbing ,no cyanosis, contractures, deformities in the hands Skin: Pressure ulcer as above  Data Reviewed: I have personally reviewed following labs and imaging studies  CBC: Recent Labs  Lab 01/31/23 1435 02/01/23 0226 02/03/23 1750 02/04/23 0305 02/05/23 0928  WBC 17.3* 14.2* 17.4* 15.6* 15.3*  NEUTROABS  --   --   --  12.0*  --   HGB 9.1* 8.2* 8.5* 8.5* 7.8*  HCT 28.6* 24.9* 27.4* 26.4* 24.5*  MCV 100.4* 98.0 101.9* 98.9 100.8*  PLT 440* 362 475* 446* [id number]*   Basic Metabolic Panel: Recent Labs  Lab 02/01/23 0226 02/02/23 1134 02/03/23 1730 02/04/23 0305 02/05/23 0927  NA 134* 133* 134* 134* 136  K 4.1 3.4* 4.0 4.2 4.5  CL 98 96* 97* 98 100  CO2 23 25 23 23 23  $ GLUCOSE 85 94 119* 101* 111*  BUN 67* 43* 64* 48* 71*  CREATININE 3.93* 2.86* 4.24* 3.46* 4.43*  CALCIUM 9.2 9.2 9.8 9.5 9.5  PHOS 3.3 3.4 4.2 3.8 4.6     No results found for this or any previous visit (from the past 240 hour(s)).   Radiology Studies: No results found.  Scheduled Meds:  (feeding supplement) PROSource Plus  30 mL Oral BID BM   acetaminophen  1,000 mg Oral Q8H   aspirin EC  81 mg Oral Daily   atorvastatin  40 mg Oral Daily   Chlorhexidine Gluconate Cloth  6 each Topical Q0600   [START ON 02/07/2023] darbepoetin (ARANESP) injection - DIALYSIS  200 mcg Subcutaneous Q Fri-1800   feeding supplement  237 mL Oral TID BM   folic acid  1 mg Oral Daily   gabapentin  100 mg Oral TID   heparin  5,000 Units Subcutaneous Q8H   isosorbide dinitrate  5 mg Oral BID   leptospermum manuka honey  1 Application Topical Daily   midodrine  10 mg Oral Q M,W,F-HD    multivitamin  1 tablet Oral QHS   pantoprazole  40 mg Oral Daily   sodium hypochlorite   Irrigation BID   Continuous Infusions:  sodium chloride Stopped (02/01/23 2102)   ceFEPime (MAXIPIME) IV 200 mL/hr at 02/06/23 0437     LOS: 15 days  Shelly Coss, MD [facility] P2/15/2024, 8:14 AM

## 2023-02-06 NOTE — TOC Progression Note (Signed)
Transition of Care [facility]) - Progression Note    Patient Details  Name: [name] MRN: [id number] Date of Birth: 12-24-1945  Transition of Care [facility]) CM/SW [employer], RN Phone Number: 02/06/2023, 10:16 AM  Clinical Narrative:    [facility] called and has requested a Peer to peer today by 2:30 pm by the attending physician.  Information provided to Dr. Tawanna Solo to call [facility] at [phone] option [phone] and provide patient's name, DOB and subscriber # [phone].   Expected Discharge Plan: [place] Barriers to Discharge: Continued Medical Work up  Expected Discharge Plan and Services   Discharge Planning Services: CM Consult Post Acute Care Choice: [place] (SNF versus LTAC) Living arrangements for the past 2 months: Single Family Home                                       Social Determinants of Health (SDOH) Interventions SDOH Screenings   Food Insecurity: No Food Insecurity (01/05/2023)  Housing: Low Risk  (12/17/2022)  Transportation Needs: No Transportation Needs (12/17/2022)  Utilities: Not At Risk (12/17/2022)  Tobacco Use: Medium Risk (01/28/2023)    Readmission Risk Interventions    01/30/2023    4:23 PM 01/20/2023   10:26 AM 09/16/2022   12:31 PM  Readmission Risk Prevention Plan  Transportation Screening Complete Complete Complete  Medication Review Press photographer) Complete Complete Complete  PCP or Specialist appointment within 3-5 days of discharge Complete Complete   HRI or Home Care Consult  Complete Complete  SW Recovery Care/Counseling Consult Complete Complete Complete  Palliative Care Screening Complete Complete Not Applicable  [place] Complete Complete Not Applicable

## 2023-02-06 NOTE — Progress Notes (Signed)
Patient ID: UZOMA THWING, male   DOB: 05/17/1946, 77 y.o.   MRN: [id number] [facility] Progress Note   Assessment/ Plan:   1.  Septic shock: Secondary to E. 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  Weaned off of pressors with completion of intravenous metronidazole and remains on antimicrobial therapy with intravenous cefepime.  Ongoing efforts for placement to skilled nursing facility to continue wound care as well as assist with ADLs. 2. ESRD: Continue hemodialysis on a Monday/Wednesday/Friday schedule with next hemodialysis treatment to be ordered again for tomorrow.  He does not have any acute indication for dialysis at this time.  3. Anemia: Secondary to chronic illness and exacerbated by acute disease/hospitalization.  ESA dose increased anticipating resistance in the setting of wound/malnutrition. 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 5. Nutrition: Significant malnutrition noted, likely complicated by sacral decubitus/sepsis.  Continue efforts at protein supplementation and infection/wound management. 6. Hypotension: Off pressors, remains on midodrine to facilitate dialysis.  Subjective:   Tolerated dialysis without problems yesterday.  Intermittent back pain.   Objective:   BP 108/61 (BP Location: Left Arm)   Pulse 94   Temp (!) 97.4 F (36.3 C) (Oral)   Resp 17   Ht 6' 3"$  (1.905 m)   Wt 95.7 kg   SpO2 97%   BMI 26.37 kg/m   Physical Exam: Gen: Chronically ill/cachectic appearing man resting comfortably in bed.  Awakens easily to conversation. CVS: Pulse regular rhythm, normal rate, S1 and S2 normal Resp: Clear to auscultation bilaterally, no distinct rales or rhonchi.  Right IJ TDC Abd: Soft, flat, nontender, bowel sounds normal Ext: No lower extremity edema.    Labs: BMET Recent Labs  Lab 01/31/23 0409 02/01/23 0226 02/02/23 1134 02/03/23 1730 02/04/23 0305  02/05/23 0927  NA 133* 134* 133* 134* 134* 136  K 4.0 4.1 3.4* 4.0 4.2 4.5  CL 96* 98 96* 97* 98 100  CO2 22 23 25 23 23 23  $ GLUCOSE 107* 85 94 119* 101* 111*  BUN 61* 67* 43* 64* 48* 71*  CREATININE 3.45* 3.93* 2.86* 4.24* 3.46* 4.43*  CALCIUM 9.2 9.2 9.2 9.8 9.5 9.5  PHOS 2.7 3.3 3.4 4.2 3.8 4.6   CBC Recent Labs  Lab 02/01/23 0226 02/03/23 1750 02/04/23 0305 02/05/23 0928  WBC 14.2* 17.4* 15.6* 15.3*  NEUTROABS  --   --  12.0*  --   HGB 8.2* 8.5* 8.5* 7.8*  HCT 24.9* 27.4* 26.4* 24.5*  MCV 98.0 101.9* 98.9 100.8*  PLT 362 475* 446* 434*     Medications:     (feeding supplement) PROSource Plus  30 mL Oral BID BM   acetaminophen  1,000 mg Oral Q8H   aspirin EC  81 mg Oral Daily   atorvastatin  40 mg Oral Daily   Chlorhexidine Gluconate Cloth  6 each Topical Q0600   darbepoetin (ARANESP) injection - DIALYSIS  100 mcg Subcutaneous Q Fri-1800   feeding supplement  237 mL Oral TID BM   folic acid  1 mg Oral Daily   gabapentin  100 mg Oral TID   heparin  5,000 Units Subcutaneous Q8H   isosorbide dinitrate  5 mg Oral BID   leptospermum manuka honey  1 Application Topical Daily   midodrine  10 mg Oral Q M,W,F-HD   multivitamin  1 tablet Oral QHS   pantoprazole  40 mg Oral Daily   sodium hypochlorite   Irrigation BID   Elmarie Shiley,  MD 02/06/2023, 7:45 AM

## 2023-02-07 ENCOUNTER — Inpatient Hospital Stay (HOSPITAL_COMMUNITY): Payer: Medicare Other

## 2023-02-07 DIAGNOSIS — R6521 Severe sepsis with septic shock: Secondary | ICD-10-CM | POA: Diagnosis not present

## 2023-02-07 DIAGNOSIS — A419 Sepsis, unspecified organism: Secondary | ICD-10-CM | POA: Diagnosis not present

## 2023-02-07 HISTORY — PX: IR FLUORO GUIDE CV LINE RIGHT: IMG2283

## 2023-02-07 LAB — RENAL FUNCTION PANEL
Albumin: 1.8 g/dL — ABNORMAL LOW (ref 3.5–5.0)
Anion gap: 15 (ref 5–15)
BUN: 65 mg/dL — ABNORMAL HIGH (ref 8–23)
CO2: 23 mmol/L (ref 22–32)
Calcium: 9.8 mg/dL (ref 8.9–10.3)
Chloride: 99 mmol/L (ref 98–111)
Creatinine, Ser: 4.17 mg/dL — ABNORMAL HIGH (ref 0.61–1.24)
GFR, Estimated: 14 mL/min — ABNORMAL LOW (ref >60)
Glucose, Bld: 120 mg/dL — ABNORMAL HIGH (ref 70–99)
Phosphorus: 5 mg/dL — ABNORMAL HIGH (ref 2.5–4.6)
Potassium: 5.5 mmol/L — ABNORMAL HIGH (ref 3.5–5.1)
Sodium: 137 mmol/L (ref 135–145)

## 2023-02-07 LAB — CBC
HCT: 28.3 % — ABNORMAL LOW (ref 39.0–52.0)
Hemoglobin: 9 g/dL — ABNORMAL LOW (ref 13.0–17.0)
MCH: 32 pg (ref 26.0–34.0)
MCHC: 31.8 g/dL (ref 30.0–36.0)
MCV: 100.7 fL — ABNORMAL HIGH (ref 80.0–100.0)
Platelets: 220 10*3/uL (ref 150–400)
RBC: 2.81 MIL/uL — ABNORMAL LOW (ref 4.22–5.81)
RDW: 19.3 % — ABNORMAL HIGH (ref 11.5–15.5)
WBC: 15.1 10*3/uL — ABNORMAL HIGH (ref 4.0–10.5)
nRBC: 0 % (ref 0.0–0.2)

## 2023-02-07 MED ORDER — DAKINS (1/4 STRENGTH) 0.125 % EX SOLN: Freq: Two times a day (BID) | CUTANEOUS | 0 refills | Status: DC

## 2023-02-07 MED ORDER — OXYCODONE HCL 5 MG PO TABS: 2.5000 mg | ORAL_TABLET | ORAL | 0 refills | Status: DC | PRN

## 2023-02-07 MED ORDER — FOLIC ACID 1 MG PO TABS: 1.0000 mg | ORAL_TABLET | Freq: Every day | ORAL | Status: DC

## 2023-02-07 MED ORDER — HEPARIN SODIUM (PORCINE) 1000 UNIT/ML IJ SOLN
INTRAMUSCULAR | Status: AC
  Filled 2023-02-07: qty 4

## 2023-02-07 MED ORDER — PANTOPRAZOLE SODIUM 40 MG PO TBEC: 40.0000 mg | DELAYED_RELEASE_TABLET | Freq: Every day | ORAL | Status: DC

## 2023-02-07 MED ORDER — HEPARIN SODIUM (PORCINE) 1000 UNIT/ML IJ SOLN
INTRAMUSCULAR | Status: AC
  Filled 2023-02-07: qty 5

## 2023-02-07 MED ORDER — ALTEPLASE 2 MG IJ SOLR
INTRAMUSCULAR | Status: AC
  Administered 2023-02-07: 2 mg
  Filled 2023-02-07: qty 4

## 2023-02-07 MED ORDER — HEPARIN SODIUM (PORCINE) 1000 UNIT/ML IJ SOLN
5000.0000 [IU] | Freq: Once | INTRAMUSCULAR | Status: AC
  Administered 2023-02-07: 5000 [IU]

## 2023-02-07 MED ORDER — HEPARIN SODIUM (PORCINE) 1000 UNIT/ML IJ SOLN
INTRAMUSCULAR | Status: AC
  Administered 2023-02-07: 3800 [IU]
  Filled 2023-02-07: qty 10

## 2023-02-07 MED ORDER — LIDOCAINE-EPINEPHRINE 1 %-1:100000 IJ SOLN
INTRAMUSCULAR | Status: AC
  Administered 2023-02-07: 9 mL via INTRADERMAL
  Filled 2023-02-07: qty 1

## 2023-02-07 MED ORDER — CEFAZOLIN SODIUM-DEXTROSE 2-4 GM/100ML-% IV SOLN
2.0000 g | INTRAVENOUS | Status: AC
  Administered 2023-02-07: 2 g via INTRAVENOUS
  Filled 2023-02-07: qty 100

## 2023-02-07 NOTE — Progress Notes (Signed)
Complete bath, bed care, CHG, sacrum drsg. change with Dakins completed. Pt. Oral care and peri-care completed.

## 2023-02-07 NOTE — TOC Progression Note (Addendum)
Transition of Care [facility]) - Progression Note    Patient Details  Name: AYDEN COULTHARD MRN: [id number] Date of Birth: [birth year]  Transition of Care [facility]) CM/SW [place], RN Phone Number: 02/07/2023, 8:50 AM  Clinical Narrative:    CM noted that patient was approved by [facility] for placement a [facility].  I called Destiny Hutchins, CM at the facility and they are able to receive the patient for admission after dialysis depending on patient's return from Inpatient dialysis today.  Attending MD was notified and aware.  I received an email message back from financial counseling that the patient would not qualify for Medicaid due to his currently monthly income through his social security and pension.  The patient's brother has been in communication with financial counseling.  [facility] facility is aware and will follow up with the patient/ family regarding admission and placement at the facility.  02/07/2023 1033 - CM called and spoke with [facility] and the facility is able to accept the patient for admission today.  The patient is currently in dialysis and can transfer to the facility when he returns to the unit.  Discharge summary and orders were uploaded in the hub for the facility.  PTAR was scheduled for 3 pm today.  Bedside nursing - please call report to [facility] [phone].  I called and updated the patient's brother by phone and asked that he follow up with the business office at the facility to coordinate plans for LTC at the facility once availability to [facility] funding has been depleted.  The brother is in agreement and will speak with the brother and facility next week to arrange.  02/07/2023 1243- Patient's HD cath is not working at this time and patient is scheduled to return to IR this afternoon to have new catheter placed.  I called the SNF facility - [facility] and the patient can discharge to the facility  tomorrow.  Attending MD was made aware that discharge summary will need to be sent to the facility before 11 am for them to receive him.  Discharge is delayed until tomorrow.   Expected Discharge Plan: [place] Barriers to Discharge: Continued Medical Work up  Expected Discharge Plan and Services   Discharge Planning Services: CM Consult Post Acute Care Choice: [place] (SNF versus LTAC) Living arrangements for the past 2 months: Single Family Home                                       Social Determinants of Health (SDOH) Interventions SDOH Screenings   Food Insecurity: No Food Insecurity (01/05/2023)  Housing: Low Risk  (12/17/2022)  Transportation Needs: No Transportation Needs (12/17/2022)  Utilities: Not At Risk (12/17/2022)  Tobacco Use: Medium Risk (01/28/2023)    Readmission Risk Interventions    01/30/2023    4:23 PM 01/20/2023   10:26 AM 09/16/2022   12:31 PM  Readmission Risk Prevention Plan  Transportation Screening Complete Complete Complete  Medication Review Press photographer) Complete Complete Complete  PCP or Specialist appointment within 3-5 days of discharge Complete Complete   HRI or Home Care Consult  Complete Complete  SW Recovery Care/Counseling Consult Complete Complete Complete  Palliative Care Screening Complete Complete Not Applicable  [place] Complete Complete Not Applicable

## 2023-02-07 NOTE — Procedures (Signed)
Patient seen on Hemodialysis. BP 115/66 (BP Location: Left Arm)   Pulse 99   Temp 97.9 F (36.6 C) (Oral)   Resp 14   Ht 6' 3"$  (1.905 m)   Wt 95.7 kg   SpO2 99%   BMI 26.37 kg/m   QB 400, UF goal 1.5L Tolerating treatment without complaints at this time but cathter alarming with high arterial pressure-- will dwell with TPA for 40mn and reverse lines.   JElmarie ShileyMD [facility] Office # [phone] # [phone]:52 AM

## 2023-02-07 NOTE — Progress Notes (Signed)
Applied Heparin 5000 per NP Veneta Penton, and pt. Only can run at 200, NP is aware.

## 2023-02-07 NOTE — Progress Notes (Signed)
   02/07/23 1209  Pain Assessment  Pain Scale 0-10  Pain Score 0  Hemodialysis Catheter Right Subclavian Double lumen Permanent (Tunneled)  Placement Date/Time: 01/27/23 1658   Serial / Lot #: [id number]  Expiration Date: 06/25/27  Time Out: Correct patient;Correct site;Correct procedure  Maximum sterile barrier precautions: Hand hygiene;Cap;Mask;Sterile gown;Sterile gloves;Large sterile ...  Site Condition No complications  Blue Lumen Status Infusing;Heparin locked  Red Lumen Status Infusing;Heparin locked  Purple Lumen Status N/A  Catheter fill solution Heparin 1000 units/ml  Catheter fill volume (Arterial) 1.9 cc  Catheter fill volume (Venous) 1.9  Dressing Type Transparent  Dressing Status Antimicrobial disc in place  Neurological  Level of Consciousness Alert  Orientation Level Oriented X4  Cardiac  Cardiac Rhythm NSR   Received patient in bed to unit.  Alert and oriented.  Informed consent signed and in chart.   TX duration:  Patient tolerated well.  Transported back to the room  Alert, without acute distress.  Hand-off given to patient's nurse.   Access used: Yes Access issues: Yes Catheter will not run well, applied heparin, cathflo  Total UF removed: 1000 Medication(s) given: Cathflo, heparin Post HD VS: 146/59, 85,15, 99 R/A 98.0 Post HD weight: None   [facility] Received patient in bed to unit.  Alert and oriented.  Informed consent signed and in chart.   [place] duration:2:50 min        [facility]

## 2023-02-07 NOTE — Procedures (Signed)
Pre-procedure Diagnosis: ESRD; Poorly functioning HD catheter.  Post-procedure Diagnosis: Same  Successful fluoro guided replacement of tunneled HD catheter with tips terminating within the superior aspect of the right atrium.    Complications: None Immediate EBL: Trace  The catheter is ready for immediate use.   Ronny Bacon, MD Pager #: [phone]

## 2023-02-07 NOTE — Progress Notes (Signed)
Removed TPA from both HD ports. Venous port was sluggish to resistance,arterial port pull without difficulties. Pt is back on machine at this time, RN will continued to monitor pt.

## 2023-02-07 NOTE — Progress Notes (Signed)
Patient went to dialysis this AM around 830. He returned and went to IR for HD cath exchange. He will discharge tomorrow to [facility]. Discharge was delayed today due to cath exchange. Wound care was done by PT. Patient is now waiting for dinner to arrive.

## 2023-02-07 NOTE — Progress Notes (Signed)
Contacted by RN CM regarding pt's d/c to snf today. Contacted [place] and spoke to [place]. Clinic advised pt will return to snf today and resume at clinic on Monday. Pt's out-pt HD info placed on AVS. Pt will need to arrive at 11:30 for 11:55 chair time (MWF).   Melven Sartorius Renal Navigator [phone]

## 2023-02-07 NOTE — Progress Notes (Signed)
BFR is turned up to 250 now and the machine is still alarming.

## 2023-02-07 NOTE — Progress Notes (Signed)
Applied Cathflo activase 76m per both HD catheter ports per. MD PPosey Prontofo 30 mins.

## 2023-02-07 NOTE — Discharge Summary (Signed)
Physician Discharge Summary  TOUSSAINT SKRINE [id number] DOB: April 07, 1946 DOA: 01/22/2023  PCP: Redmond School, MD  Admit date: 01/22/2023 Discharge date: 02/08/2023  Admitted From: SNF Disposition:  SnF  Discharge Condition:Stable CODE STATUS:FULL Diet recommendation:  Regular   Brief/Interim Summary: Patient is a 77 year old male with history of ESRD on dialysis On Monday, Wednesday, Friday, coronary artery disease, diabetes type 2, recent prolonged hospitalization for NSTEMI complicated by infected sacral decubitus ulcer,mostly bedbound who was brought from dialysis facility for further evaluation of hypotension.  On presentation he was found to be severely hypotensive and was admitted under ICU service for the management of septic shock.  Currently hemodynamically stable.  Plan for discharge to SNF today. Patient received a short session of dialysis before discharge.  Patient was seen and examined before discharge today.  No new complaints.  Patient need to continue his routine dialysis. Patient will follow-up with infectious disease as an outpatient Follow-up with primary care provider  Following problems were addressed during the hospitalization:  Septic shock/gram-negative bacteremia: Presented with severe hypotension.  Blood cultures showed E. coli, Morganella.  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 Initially admitted on ICU service.  Required vasopressors.  Patient was seen by ID, general surgery.  Debridement was not recommended.  As per ID, coccygeal osteomyelitis is not curable and patient is high risk for recurrent infection and readmission.  Currently on  cefepime, completed antibiotics  on 2/15.  Received cefepime and Flagyl. On midodrine for hypotension.  Patient will follow-up with ID after discharge. Patient still has mild leukocytosis.  Afebrile.Check CBC in a week   Sacral ulcer/chronic osteomyelitis of coccyx: Physical therapy was  following for hydrotherapy of the wound.  Will be a chronic issue.  Continue wound cardiac nursing facility   ESRD on dialysis: Nephrology following for dialysis.  He has dialysis catheter on the right chest.  Potassium of 5.5, patient has been dialyzed today. Continue outpatient dialysis  Acute metabolic encephalopathy: Suspected to be from sepsis.  Currently resolved   Iron deficiency anemia: Currently hemoglobin stable in the range of 7-8.  Continue arenesp , folate,iron   History of hyperlipidemia: On Lipitor.   Severe malnutrition: Nutritionist was following   Deconditioning/debility/goals of care: Patient is originally from SNF.  Mostly bedbound.TOC closely following.  Plan is to return to SNF for continued wound care.  Very poor quality of life, Palliaitve care also following for goals of care.     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 Apply Dakins moist gauze to wound bed. Cover with dry gauze and ABD pad/tape.   Increase activity slowly  Complete by: As directed    Increase activity slowly   Complete by: As directed    No wound care   Complete by: As directed    As per wound care nurse      Allergies as of  02/08/2023       Reactions   Metformin Nausea And Vomiting   Niacin    Niacin And Related Other (See Comments)   Reaction: flushing and burning side effects even with extended release        Medication List     STOP taking these medications    amoxicillin-clavulanate 500-125 MG tablet Commonly known as: AUGMENTIN   calcitRIOL 0.25 MCG capsule Commonly known as: ROCALTROL       TAKE these medications    acetaminophen 500 MG tablet Commonly known as: TYLENOL Take 2 tablets (1,000 mg total) by mouth every 6 (six) hours.   allopurinol 100 MG tablet Commonly known as: ZYLOPRIM Take 100 mg by mouth daily.   aspirin EC 81 MG tablet Take 1 tablet (81 mg total) by mouth daily.   atorvastatin 40 MG tablet Commonly known as: LIPITOR Take 1 tablet (40 mg total) by mouth daily.   carboxymethylcellulose 0.5 % Soln Commonly known as: REFRESH PLUS Place 1 drop into both eyes daily as needed (dry eyes).   Darbepoetin Alfa 200 MCG/0.4ML Sosy injection Commonly known as: ARANESP Inject 0.4 mLs (200 mcg total) into the skin every Friday at 6 PM.   feeding supplement Liqd Take 237 mLs by mouth 3 (three) times daily with meals.   FeroSul 325 (65 FE) MG tablet Generic drug: ferrous sulfate Take 325 mg by mouth daily.   folic acid 1 MG tablet Commonly known as: FOLVITE Take 1 tablet (1 mg total) by mouth daily.   gabapentin 100 MG capsule Commonly known as: NEURONTIN Take 1 capsule (100 mg total) by mouth 3 (three) times daily.   Gerhardt's butt cream Crea Apply 1 Application topically 2 (two) times daily.   isosorbide dinitrate 5 MG tablet Commonly known as: ISORDIL Take 1 tablet (5 mg total) by mouth 2 (two) times daily.   leptospermum manuka honey Pste paste Apply 1 Application topically daily.   meclizine 12.5 MG tablet Commonly known as: ANTIVERT Take 1 tablet (12.5 mg total) by mouth 3 (three) times daily as needed for dizziness.   melatonin 5 MG Tabs Take  6 mg by mouth at bedtime.   midodrine 10 MG tablet Commonly known as: PROAMATINE Take 1 tablet (10 mg total) by mouth every Monday, Wednesday, and Friday with hemodialysis.   multivitamin Tabs tablet Take 1 tablet by mouth at bedtime.   nitroGLYCERIN 0.4 MG SL tablet Commonly known as: NITROSTAT Place 0.4 mg under the tongue every 5 (five) minutes as needed for chest pain.   ondansetron 4 MG tablet Commonly known as: ZOFRAN Take 4 mg by mouth every 6 (six) hours as needed for nausea or vomiting.   oxyCODONE 5 MG immediate release tablet Commonly known as: Oxy IR/ROXICODONE Take 0.5-1 tablets (2.5-5 mg total) by mouth every 4 (four) hours as needed for moderate pain. What changed: how much to take   pantoprazole 40 MG tablet Commonly known as: PROTONIX Take 1 tablet (40 mg total) by mouth daily.   polyethylene glycol 17 g packet Commonly known as: MIRALAX / GLYCOLAX Take 17 g by mouth 2 (two) times daily.   prochlorperazine 5 MG tablet Commonly known as: COMPAZINE Take 1 tablet (5 mg total) by mouth  every 6 (six) hours as needed for nausea or vomiting.   Restasis 0.05 % ophthalmic emulsion Generic drug: cycloSPORINE Place 1 drop into both eyes 2 (two) times daily.   Santyl 250 UNIT/GM ointment Generic drug: collagenase Apply topically daily.   senna-docusate 8.6-50 MG tablet Commonly known as: Senokot-S Take 2 tablets by mouth 2 (two) times daily.   simethicone 80 MG chewable tablet Commonly known as: MYLICON Chew 80 mg by mouth every 6 (six) hours as needed for flatulence.   sodium hypochlorite 0.125 % Soln Commonly known as: DAKIN'S 1/4 STRENGTH Irrigate with as directed 2 (two) times daily.   THERATRUM COMPLETE PO Take 1 tablet by mouth daily.   True Metrix Blood Glucose Test test strip Generic drug: glucose blood USE TO TEST BLOOD SUGAR UP TO TWICE DAILY OR AS DIRECTED               Discharge Care Instructions  (From admission, onward)            Start     Ordered   02/08/23 0000  Discharge wound care:       Comments: Cleanse sacral wound with NS and pat dry. Apply Dakins moist gauze to wound bed. Cover with dry gauze and ABD pad/tape.   02/08/23 1020            Contact information for follow-up providers     [place]. Go on 02/10/2023.   Why: Schedule is Monday/Wednesday/Friday. Arrive at 11:30 for 11:55 chair time. Contact information: [address] [place] [address] [phone]              Contact information for after-discharge care     Destination     [facility] .   Service: Skilled Nursing Contact information: [address] [address] [phone]                    Allergies  Allergen Reactions   Metformin Nausea And Vomiting   Niacin    Niacin And Related Other (See Comments)    Reaction: flushing and burning side effects even with extended release    Consultations: Nephrology,palliative care,pccm   Procedures/Studies: IR Fluoro Guide CV Line Right  Result Date: 02/07/2023 INDICATION: Poorly functioning hemodialysis catheter. Please perform fluoroscopic guided dialysis catheter exchange. EXAM: TUNNELED CENTRAL VENOUS HEMODIALYSIS CATHETER REPLACEMENT WITH FLUOROSCOPIC GUIDANCE MEDICATIONS: None FLUOROSCOPY TIME:  54 seconds (7 mGy) COMPLICATIONS: None immediate. PROCEDURE: Informed written consent was obtained from patient's family after a discussion of the risks, benefits, and alternatives to treatment. Questions regarding the procedure were encouraged and answered. The skin and external portion of the existing hemodialysis catheter was prepped with chlorhexidine in a sterile fashion, and a sterile drape was applied covering the operative field. Maximum barrier sterile technique with sterile gowns and gloves were used for the procedure. A timeout was performed prior to the  initiation of the procedure. Both lumens of the hemodialysis catheter were cannulated with a stiff Glidewire and advanced to the level of the right atrium. Under intermittent fluoroscopic guidance, the existing dialysis catheter was exchanged for a new 23 cm tip to cuff palindrome dialysis catheter with tips ultimately terminating within the superior aspect of the right atrium. Final catheter positioning was confirmed and documented with a spot radiographic image. The catheter aspirates and flushes normally. The catheter was flushed with appropriate volume heparin dwells. The catheter exit site was secured with a  0-Prolene retention sutures. Both lumens were heparinized. A dressing was placed. The patient tolerated the procedure well without immediate post procedural complication. IMPRESSION: Successful replacement of 23 cm tip to cuff tunneled hemodialysis catheter with tips terminating within the right atrium. The catheter is ready for immediate use. Electronically Signed   By: Sandi Mariscal M.D.   On: 02/07/2023 16:23   IR Fluoro Guide CV Line Right  Result Date: 01/28/2023 INDICATION: Need for long-term dialysis access EXAM: Ultrasound-guided puncture of the right internal jugular vein Placement of a tunneled hemodialysis catheter using fluoroscopic guidance MEDICATIONS: Documented in the EMR ANESTHESIA/SEDATION: Moderate (conscious) sedation was employed during this procedure. A total of Versed 2 mg and Fentanyl 100 mcg was administered intravenously. Moderate Sedation Time: 29 minutes. The patient's level of consciousness and vital signs were monitored continuously by radiology nursing throughout the procedure under my direct supervision. FLUOROSCOPY TIME:  Fluoroscopy Time: 1.2 minutes (9 mGy) COMPLICATIONS: None immediate. PROCEDURE: Informed written consent was obtained from the patient after a thorough discussion of the procedural risks, benefits and alternatives. All questions were addressed. Maximal  Sterile Barrier Technique was utilized including caps, mask, sterile gowns, sterile gloves, sterile drape, hand hygiene and skin antiseptic. A timeout was performed prior to the initiation of the procedure. The patient was placed supine on the exam table. The right neck and chest was prepped and draped in the standard sterile fashion. Ultrasound was used to evaluate the right internal jugular vein, which found to be patent and suitable for access. An ultrasound image was permanently stored in the electronic medical record. Using ultrasound guidance, the right internal jugular vein was directly punctured using a 21 gauge micropuncture set. An 018 wire was advanced into the SVC, followed by serial tract dilation and placement of an 035 wire which was advanced into the IVC. Attention was then turned to an infraclavicular location, where a small dermatotomy was made after the administration of additional local anesthetic. From this location, a 23 cm tip-to-cuff hemodialysis catheter was tunneled to the venotomy site. A peel-away sheath was then advanced over the access wire. Through this peel-away sheath, the hemodialysis catheter was advanced into the central veins, such that the tip was positioned near the superior cavoatrial junction. The line was found to flush and aspirate appropriately. It was sutured to the skin using 0 silk suture, and the venotomy was closed with Dermabond. A sterile dressing was placed. The patient tolerated the procedure well without immediate complication. IMPRESSION: Successful placement of a tunneled hemodialysis catheter via the right internal jugular vein. The line is ready for immediate use. Electronically Signed   By: Albin Felling M.D.   On: 01/28/2023 14:45   IR US Guide Vasc Access Right  Result Date: 01/28/2023 INDICATION: Need for long-term dialysis access EXAM: Ultrasound-guided puncture of the right internal jugular vein Placement of a tunneled hemodialysis catheter using  fluoroscopic guidance MEDICATIONS: Documented in the EMR ANESTHESIA/SEDATION: Moderate (conscious) sedation was employed during this procedure. A total of Versed 2 mg and Fentanyl 100 mcg was administered intravenously. Moderate Sedation Time: 29 minutes. The patient's level of consciousness and vital signs were monitored continuously by radiology nursing throughout the procedure under my direct supervision. FLUOROSCOPY TIME:  Fluoroscopy Time: 1.2 minutes (9 mGy) COMPLICATIONS: None immediate. PROCEDURE: Informed written consent was obtained from the patient after a thorough discussion of the procedural risks, benefits and alternatives. All questions were addressed. Maximal Sterile Barrier Technique was utilized including caps, mask, sterile gowns, sterile gloves, sterile drape,  hand hygiene and skin antiseptic. A timeout was performed prior to the initiation of the procedure. The patient was placed supine on the exam table. The right neck and chest was prepped and draped in the standard sterile fashion. Ultrasound was used to evaluate the right internal jugular vein, which found to be patent and suitable for access. An ultrasound image was permanently stored in the electronic medical record. Using ultrasound guidance, the right internal jugular vein was directly punctured using a 21 gauge micropuncture set. An 018 wire was advanced into the SVC, followed by serial tract dilation and placement of an 035 wire which was advanced into the IVC. Attention was then turned to an infraclavicular location, where a small dermatotomy was made after the administration of additional local anesthetic. From this location, a 23 cm tip-to-cuff hemodialysis catheter was tunneled to the venotomy site. A peel-away sheath was then advanced over the access wire. Through this peel-away sheath, the hemodialysis catheter was advanced into the central veins, such that the tip was positioned near the superior cavoatrial junction. The line  was found to flush and aspirate appropriately. It was sutured to the skin using 0 silk suture, and the venotomy was closed with Dermabond. A sterile dressing was placed. The patient tolerated the procedure well without immediate complication. IMPRESSION: Successful placement of a tunneled hemodialysis catheter via the right internal jugular vein. The line is ready for immediate use. Electronically Signed   By: Albin Felling M.D.   On: 01/28/2023 14:45   VAS [place] UPPER EXTREMITY VENOUS DUPLEX  Result Date: 01/24/2023 UPPER VENOUS STUDY  Patient Name:  DREYDAN SOTTO  Date of Exam:   01/23/2023 Medical Rec #: [id number]        Accession #:    [id number] Date of Birth: 12/22/1946        Patient Gender: M Patient Age:   89 years Exam Location:  [facility] Procedure:      VAS [place] UPPER EXTREMITY VENOUS DUPLEX Referring Phys: Eddie Dibbles HOFFMAN --------------------------------------------------------------------------------  Indications: Edema Risk Factors: None identified. Limitations: Poor ultrasound/tissue interface, bandages and line. Comparison Study: No prior studies. Performing Technologist: Oliver Hum RVT  Examination Guidelines: A complete evaluation includes B-mode imaging, spectral Doppler, color Doppler, and power Doppler as needed of all accessible portions of each vessel. Bilateral testing is considered an integral part of a complete examination. Limited examinations for reoccurring indications may be performed as noted.  Right Findings: +----------+------------+---------+-----------+----------+-------+ RIGHT     CompressiblePhasicitySpontaneousPropertiesSummary +----------+------------+---------+-----------+----------+-------+ IJV           Full       Yes       Yes                      +----------+------------+---------+-----------+----------+-------+ Subclavian    Full       Yes       Yes                      +----------+------------+---------+-----------+----------+-------+  Axillary      Full       Yes       Yes                      +----------+------------+---------+-----------+----------+-------+ Brachial      Full       Yes       Yes                      +----------+------------+---------+-----------+----------+-------+  Radial        Full                                          +----------+------------+---------+-----------+----------+-------+ Ulnar         Full                                          +----------+------------+---------+-----------+----------+-------+ Cephalic      None                                   Acute  +----------+------------+---------+-----------+----------+-------+ Basilic       Full                                          +----------+------------+---------+-----------+----------+-------+ Thrombus detected in the cephalic vein is noted to only be in the mid forearm.  Left Findings: +----------+------------+---------+-----------+----------+-------+ LEFT      CompressiblePhasicitySpontaneousPropertiesSummary +----------+------------+---------+-----------+----------+-------+ Subclavian    Full       Yes       Yes                      +----------+------------+---------+-----------+----------+-------+  Summary:  Right: No evidence of deep vein thrombosis in the upper extremity. Findings consistent with acute superficial vein thrombosis involving the right cephalic vein.  Left: No evidence of thrombosis in the subclavian.  *See table(s) above for measurements and observations.  Diagnosing physician: Jamelle Haring Electronically signed by Jamelle Haring on 01/24/2023 at 7:30:04 AM.    Final    IR Removal Tun Cv Cath W/O FL  Result Date: 01/23/2023 INDICATION: 77 year old with gram negative bacteremia in the setting of tunneled dialysis catheter placement as well as sacral wound. EXAM: REMOVAL TUNNELED CENTRAL VENOUS CATHETER MEDICATIONS: 5 mL 1% lidocaine ANESTHESIA/SEDATION: None FLUOROSCOPY: None  COMPLICATIONS: None immediate. PROCEDURE: Informed written consent was obtained from the patient after a thorough discussion of the procedural risks, benefits and alternatives. All questions were addressed. Maximal Sterile Barrier Technique was utilized including caps, mask, sterile gowns, sterile gloves, sterile drape, hand hygiene and skin antiseptic. A timeout was performed prior to the initiation of the procedure. The patient's right chest and catheter was prepped and draped in a normal sterile fashion. Heparin was removed from both ports of catheter. 1% lidocaine was used for local anesthesia. Using gentle blunt dissection the cuff of the catheter was exposed and the catheter was removed in it's entirety. Pressure was held till hemostasis was obtained. A sterile dressing was applied. The patient tolerated the procedure well with no immediate complications. IMPRESSION: Successful catheter removal as described above. Read by: Brynda Greathouse PA-C Electronically Signed   By: Ruthann Cancer M.D.   On: 01/23/2023 20:29   CT CHEST ABDOMEN PELVIS WO CONTRAST  Result Date: 01/23/2023 CLINICAL DATA:  Recent hospitalization for cholecystitis and sacral ulcer, now with sepsis EXAM: CT CHEST, ABDOMEN AND PELVIS WITHOUT CONTRAST TECHNIQUE: Multidetector CT imaging of the chest, abdomen and pelvis was performed following the standard protocol without IV contrast. RADIATION DOSE REDUCTION: This exam was performed according to the departmental dose-optimization program which includes  automated exposure control, adjustment of the mA and/or kV according to patient size and/or use of iterative reconstruction technique. COMPARISON:  CT abdomen/pelvis dated 12/26/2022 FINDINGS: CT CHEST FINDINGS Cardiovascular: The heart is normal in size. No pericardial effusion. No evidence of thoracic aortic aneurysm. Atherosclerotic calcifications of the aortic arch. Severe three-vessel coronary atherosclerosis. Right IJ venous catheter  terminates in the upper right atrium. Mediastinum/Nodes: No suspicious mediastinal lymphadenopathy. Visualized thyroid is unremarkable. Lungs/Pleura: Mild bilateral lower lobe scarring/atelectasis. No focal consolidation. Evaluation lung parenchyma is constrained by respiratory motion. Within that constraint, there are no suspicious pulmonary nodules. No pleural effusion or pneumothorax. Musculoskeletal: Healing right posterior 11th rib fracture (series 3/image 66). Moderate degenerative changes of the mid/lower thoracic spine. Thoracic spine stimulator. CT ABDOMEN PELVIS FINDINGS Hepatobiliary: Scattered small hepatic cysts, unchanged. Status post cholecystectomy. No intrahepatic or extrahepatic ductal dilatation. Pancreas: Within normal limits. Spleen: Within normal limits. Adrenals/Urinary Tract: Adrenal glands are within normal limits. Bilateral renal cysts, measuring up to 2.5 cm in the anterior interpolar right kidney (series 3/image 84), measuring simple fluid density, benign (Bosniak I). No follow-up is recommended. No renal calculi or hydronephrosis. Bladder is mildly thick-walled although underdistended. Stomach/Bowel: Stomach is within normal limits. No evidence of bowel obstruction. Normal appendix (series 3/image 83). Mild eccentric rectal wall thickening (series 3/image 117), but new from the prior, favoring infectious/inflammatory colitis. Vascular/Lymphatic: No evidence of abdominal aortic aneurysm. Atherosclerotic calcifications of the abdominal aorta and branch vessels. No suspicious abdominopelvic lymphadenopathy. Reproductive: Prostate is unremarkable. Other: No abdominopelvic ascites. Musculoskeletal: Status post PLIF at L2-5. Degenerative changes of the lumbar spine. Large left paramidline sacral decubitus ulcer (series 3/image 117), progressive from the prior, with associated coccygeal osteomyelitis (series 3/image 120). IMPRESSION: Large left sacral decubitus ulcer, progressive from the  prior, with associated coccygeal osteomyelitis. Mild eccentric rectal wall thickening, new from the prior, favoring infectious/inflammatory colitis. Healing right posterior 11th rib fracture. Otherwise, no acute cardiopulmonary abnormality. Additional ancillary findings as above. Electronically Signed   By: Julian Hy M.D.   On: 01/23/2023 03:58   CT Head Wo Contrast  Result Date: 01/23/2023 CLINICAL DATA:  Recent hospitalization for cholecystitis and sacral ulcer, now with sepsis EXAM: CT HEAD WITHOUT CONTRAST TECHNIQUE: Contiguous axial images were obtained from the base of the skull through the vertex without intravenous contrast. RADIATION DOSE REDUCTION: This exam was performed according to the departmental dose-optimization program which includes automated exposure control, adjustment of the mA and/or kV according to patient size and/or use of iterative reconstruction technique. COMPARISON:  11/04/2022 FINDINGS: Brain: No evidence of acute infarction, hemorrhage, hydrocephalus, extra-axial collection or mass lesion/mass effect. Global cortical atrophy. Subcortical white matter and periventricular small vessel ischemic changes. Vascular: Intracranial atherosclerosis. Skull: Normal. Negative for fracture or focal lesion. Sinuses/Orbits: The visualized paranasal sinuses are essentially clear. The mastoid air cells are unopacified. Other: None. IMPRESSION: No evidence of acute intracranial abnormality. Atrophy with small vessel ischemic changes. Electronically Signed   By: Julian Hy M.D.   On: 01/23/2023 03:48   DG Chest Port 1 View  Result Date: 01/22/2023 CLINICAL DATA:  Altered mental status EXAM: PORTABLE CHEST 1 VIEW COMPARISON:  Chest x-ray dated November 04, 2022 FINDINGS: Cardiac and mediastinal contours are unchanged. Right central venous catheter is unchanged in position. Right costophrenic angle is excluded from field of view. Visualized lungs are clear. The visualized skeletal  structures are unremarkable. IMPRESSION: No active disease. Electronically Signed   By: Yetta Glassman M.D.   On: 01/22/2023 18:01  Subjective: Patient was seen and examined today.  No new complaints.   Discharge Exam: Vitals:   02/08/23 0413 02/08/23 0745  BP: 121/63 115/65  Pulse: 89 91  Resp: 18 16  Temp: 99.3 F (37.4 C) 98.4 F (36.9 C)  SpO2: 98% 100%   Vitals:   02/07/23 1238 02/07/23 1956 02/08/23 0413 02/08/23 0745  BP: 117/66 (!) 143/68 121/63 115/65  Pulse: 100 88 89 91  Resp: 16 20 18 16  $ Temp: 98.9 F (37.2 C) (!) 97.5 F (36.4 C) 99.3 F (37.4 C) 98.4 F (36.9 C)  TempSrc: Oral Axillary Oral Oral  SpO2: 100% 95% 98% 100%  Weight:      Height:        General.  Frail elderly man, in no acute distress. Pulmonary.  Lungs clear bilaterally, normal respiratory effort. CV.  Regular rate and rhythm, no JVD, rub or murmur. Abdomen.  Soft, nontender, nondistended, BS positive. CNS.  Alert and oriented .  No focal neurologic deficit. Extremities.  No edema, pulses intact and symmetrical.  Left thumb amputated, bilateral lower extremities with provide [facility].  Appears to have some cognitive impairment    The results of significant diagnostics from this hospitalization (including imaging, microbiology, ancillary and laboratory) are listed below for reference.     Microbiology: No results found for this or any previous visit (from the past 240 hour(s)).   Labs: BNP (last 3 results) Recent Labs    08/08/22 0425 08/09/22 0511 08/10/22 0602  BNP 139.0* 170.0* Q000111Q*   Basic Metabolic Panel: Recent Labs  Lab 02/02/23 1134 02/03/23 1730 02/04/23 0305 02/05/23 0927 02/07/23 0836 02/08/23 0416  NA 133* 134* 134* 136 137 136  K 3.4* 4.0 4.2 4.5 5.5* 5.1  CL 96* 97* 98 100 99 97*  CO2 25 23 23 23 23 24  $ GLUCOSE 94 119* 101* 111* 120* 88  BUN 43* 64* 48* 71* 65* 66*  CREATININE 2.86* 4.24* 3.46* 4.43* 4.17* 4.24*  CALCIUM 9.2 9.8  9.5 9.5 9.8 9.8  PHOS 3.4 4.2 3.8 4.6 5.0*  --    Liver Function Tests: Recent Labs  Lab 02/02/23 1134 02/03/23 1730 02/04/23 0305 02/05/23 0927 02/07/23 0836  ALBUMIN 1.7* 1.9* 1.8* 1.7* 1.8*   No results for input(s): "LIPASE", "AMYLASE" in the last 168 hours. No results for input(s): "AMMONIA" in the last 168 hours. CBC: Recent Labs  Lab 02/03/23 1750 02/04/23 0305 02/05/23 0928 02/07/23 0836 02/08/23 0416  WBC 17.4* 15.6* 15.3* 15.1* 14.4*  NEUTROABS  --  12.0*  --   --   --   HGB 8.5* 8.5* 7.8* 9.0* 8.3*  HCT 27.4* 26.4* 24.5* 28.3* 26.1*  MCV 101.9* 98.9 100.8* 100.7* 100.8*  PLT 475* 446* 434* 220 422*   Cardiac Enzymes: No results for input(s): "CKTOTAL", "CKMB", "CKMBINDEX", "TROPONINI" in the last 168 hours. BNP: Invalid input(s): "POCBNP" CBG: Recent Labs  Lab 02/06/23 0738 02/06/23 1210 02/06/23 1647 02/06/23 2100 02/08/23 0745  GLUCAP 90 87 108* 110* 88   D-Dimer No results for input(s): "DDIMER" in the last 72 hours. Hgb A1c No results for input(s): "HGBA1C" in the last 72 hours. Lipid Profile No results for input(s): "CHOL", "HDL", "LDLCALC", "TRIG", "CHOLHDL", "LDLDIRECT" in the last 72 hours. Thyroid function studies No results for input(s): "TSH", "T4TOTAL", "T3FREE", "THYROIDAB" in the last 72 hours.  Invalid input(s): "FREET3" Anemia work up No results for input(s): "VITAMINB12", "FOLATE", "FERRITIN", "TIBC", "IRON", "RETICCTPCT" in the last 72 hours. Urinalysis  Component Value Date/Time   COLORURINE YELLOW 01/22/2023 2215   APPEARANCEUR HAZY (A) 01/22/2023 2215   LABSPEC 1.018 01/22/2023 2215   PHURINE 6.0 01/22/2023 2215   GLUCOSEU NEGATIVE 01/22/2023 2215   HGBUR NEGATIVE 01/22/2023 2215   BILIRUBINUR NEGATIVE 01/22/2023 2215   KETONESUR NEGATIVE 01/22/2023 2215   PROTEINUR 100 (A) 01/22/2023 2215   UROBILINOGEN 1.0 03/02/2015 1100   NITRITE NEGATIVE 01/22/2023 2215   LEUKOCYTESUR NEGATIVE 01/22/2023 2215   Sepsis  Labs Recent Labs  Lab 02/04/23 0305 02/05/23 0928 02/07/23 0836 02/08/23 0416  WBC 15.6* 15.3* 15.1* 14.4*   Microbiology No results found for this or any previous visit (from the past 240 hour(s)).  Please note: You were cared for by a hospitalist during your hospital stay. Once you are discharged, your primary care physician will handle any further medical issues. Please note that NO REFILLS for any discharge medications will be authorized once you are discharged, as it is imperative that you return to your primary care physician (or establish a relationship with a primary care physician if you do not have one) for your post hospital discharge needs so that they can reassess your need for medications and monitor your lab values.  This record has been created using Systems analyst. Errors have been sought and corrected,but may not always be located. Such creation errors do not reflect on the standard of care.   Time coordinating discharge: 40 minutes  SIGNED:   Lorella Nimrod, MD  [facility] 02/08/2023, 10:21 AM Pager [phone]  If 7PM-7AM, please contact night-coverage [email or web address] Password [email or web address]

## 2023-02-07 NOTE — Progress Notes (Signed)
Patient ID: [name], male   DOB: 1946/07/07, 77 y.o.   MRN: [id number] [facility] Progress Note   Assessment/ Plan:   1.  Septic shock: Secondary to E. coli bacteremia from sacral decubitus/coccygeal osteomyelitis.  Weaned off of pressors with previous completion of intravenous metronidazole and yesterday completed intravenous cefepime. Plans noted to DC to SNF today and will f/u ID as OP.    2. ESRD: Continue hemodialysis on a Monday/Wednesday/Friday schedule with hemodialysis today and resumption of MWF OP HD upon DC.  3. Anemia: Secondary to chronic illness and exacerbated by acute disease/hospitalization.  ESA dose increased anticipating resistance in the setting of wound/malnutrition. 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 5. Nutrition: Significant malnutrition noted, likely complicated by sacral decubitus/sepsis.  Continue efforts at protein supplementation and infection/wound management. 6. Hypotension: Off pressors, remains on midodrine to facilitate dialysis.  Subjective:   No complaints and plans for DC to SNF today.    Objective:   BP 115/66 (BP Location: Left Arm)   Pulse 99   Temp 97.9 F (36.6 C) (Oral)   Resp 14   Ht 6' 3"$  (1.905 m)   Wt 95.7 kg   SpO2 99%   BMI 26.37 kg/m   Physical Exam: Gen: Chronically ill/cachectic appearing man resting comfortably in recliner on dialysis. CVS: Pulse regular rhythm, normal rate, S1 and S2 normal Resp: Clear to auscultation bilaterally, no distinct rales or rhonchi.  Right IJ TDC connected to HD.  Abd: Soft, flat, nontender, bowel sounds normal Ext: No lower extremity edema.    Labs: BMET Recent Labs  Lab 02/01/23 0226 02/02/23 1134 02/03/23 1730 02/04/23 0305 02/05/23 0927 02/07/23 0836  NA 134* 133* 134* 134* 136 137  K 4.1 3.4* 4.0 4.2 4.5 5.5*  CL 98 96* 97* 98 100 99  CO2 23 25 23 23 23 23  $ GLUCOSE 85 94 119* 101* 111* 120*  BUN 67* 43* 64* 48*  71* 65*  CREATININE 3.93* 2.86* 4.24* 3.46* 4.43* 4.17*  CALCIUM 9.2 9.2 9.8 9.5 9.5 9.8  PHOS 3.3 3.4 4.2 3.8 4.6 5.0*   CBC Recent Labs  Lab 02/03/23 1750 02/04/23 0305 02/05/23 0928 02/07/23 0836  WBC 17.4* 15.6* 15.3* 15.1*  NEUTROABS  --  12.0*  --   --   HGB 8.5* 8.5* 7.8* 9.0*  HCT 27.4* 26.4* 24.5* 28.3*  MCV 101.9* 98.9 100.8* 100.7*  PLT 475* 446* 434* 220     Medications:     (feeding supplement) PROSource Plus  30 mL Oral BID BM   acetaminophen  1,000 mg Oral Q8H   aspirin EC  81 mg Oral Daily   atorvastatin  40 mg Oral Daily   Chlorhexidine Gluconate Cloth  6 each Topical Q0600   darbepoetin (ARANESP) injection - DIALYSIS  200 mcg Subcutaneous Q Fri-1800   feeding supplement  237 mL Oral TID BM   folic acid  1 mg Oral Daily   gabapentin  100 mg Oral TID   heparin  5,000 Units Subcutaneous Q8H   isosorbide dinitrate  5 mg Oral BID   leptospermum manuka honey  1 Application Topical Daily   midodrine  10 mg Oral Q M,W,F-HD   multivitamin  1 tablet Oral QHS   pantoprazole  40 mg Oral Daily   sodium hypochlorite   Irrigation BID   Elmarie Shiley, MD 02/07/2023, 9:45 AM

## 2023-02-07 NOTE — Progress Notes (Signed)
Physical Therapy Wound Treatment and Discharge Patient Details  Name: [name] MRN: [id number] Date of Birth: May 27, 1946  Today's Date: 02/07/2023 Time: 1320-1402 Time Calculation (min): 42 min  Subjective  Subjective Assessment Subjective: Pt pleasant, appears fatigued from HD this session. Patient and Family Stated Goals: have less pain Date of Onset:  (pt states about 4 months ago) Prior Treatments: hydrotherapy  Pain Score:   Pt was premedicated and slept through session.   Wound Assessment  Pressure Injury 01/23/23 Sacrum Mid Unstageable - Full thickness tissue loss in which the base of the injury is covered by slough (yellow, tan, gray, green or brown) and/or eschar (tan, brown or black) in the wound bed. (Active)  Wound Image   02/07/23 1556  Dressing Type Gauze (Comment);ABD;Honey;Barrier Film (skin prep) 02/07/23 1556  Dressing Clean, Dry, Intact 02/07/23 1556  Dressing Change Frequency Twice a day 02/07/23 1556  State of Healing Non-healing 02/07/23 1556  Site / Wound Assessment Yellow;Pink;Red;Black 02/07/23 1556  % Wound base Red or Granulating 10% 02/07/23 1556  % Wound base Yellow/Fibrinous Exudate 30% 02/07/23 1556  % Wound base Black/Eschar 60% 02/07/23 1556  % Wound base Other/Granulation Tissue (Comment) 0% 02/07/23 1556  Peri-wound Assessment Pink;Bleeding 02/07/23 1556  Wound Length (cm) 8.2 cm 02/07/23 1556  Wound Width (cm) 7 cm 02/07/23 1556  Wound Depth (cm) 3.5 cm 02/07/23 1556  Wound Surface Area (cm^2) 57.4 cm^2 02/07/23 1556  Wound Volume (cm^3) 200.9 cm^3 02/07/23 1556  Tunneling (cm) yes 02/06/23 2140  Undermining (cm) Up to 2.9 cm all the way around the wound 02/07/23 1556  Margins Unattached edges (unapproximated) 02/07/23 1556  Drainage Amount Moderate 02/07/23 1556  Drainage Description Odor - foul 02/07/23 1556  Treatment Debridement (Selective);Irrigation;Packing (Dry gauze) 02/07/23 1556   Selective Debridement  (non-excisional) Selective Debridement (non-excisional) - Location: Sacrum Selective Debridement (non-excisional) - Tools Used: Forceps, Scalpel, Scissors Selective Debridement (non-excisional) - Tissue Removed: Black and yellow necrotic tissue    Wound Assessment and Plan  Wound Therapy - Assess/Plan/Recommendations Wound Therapy - Clinical Statement: Noted a significant amount of bone exposed now, and the bone that is visible appears thin. I believe we are now at a point where wound therapy services are no longer appropriate. Wound therapy supervisor called in to lay eyes on the wound for a second opinion and we agreed that it is in the patient's best interest that wound therapy sign off, and nursing staff continue to manage this wound with the medihoney and dressing changes. Care team updated. Wound Therapy - Functional Problem List: decreased mobility Factors Delaying/Impairing Wound Healing: Diabetes Mellitus, Infection - systemic/local, Multiple medical problems, Immobility, Incontinence Hydrotherapy Plan: Debridement, Dressing change, Patient/family education Wound Therapy - Frequency:  (2x/wk) Wound Therapy - Current Recommendations: WOC nurse Wound Therapy - Follow Up Recommendations: dressing changes by RN  Wound Therapy Goals- Improve the function of patient's integumentary system by progressing the wound(s) through the phases of wound healing (inflammation - proliferation - remodeling) by: Wound Therapy Goals - Improve the function of patient's integumentary system by progressing the wound(s) through the phases of wound healing by: Decrease Necrotic Tissue to: 80 Decrease Necrotic Tissue - Progress: Not progressing Increase Granulation Tissue to: 20 Increase Granulation Tissue - Progress: Mot progressing Goals/treatment plan/discharge plan were made with and agreed upon by patient/family: Yes Time For Goal Achievement: 7 days Wound Therapy - Potential for Goals: Poor  Goals  will be updated until maximal potential achieved or discharge criteria met.  Discharge criteria: when  goals achieved, discharge from hospital, MD decision/surgical intervention, no progress towards goals, refusal/missing three consecutive treatments without notification or medical reason.  GP     Charges PT Wound Care Charges $Wound Debridement up to 20 cm: < or equal to 20 cm $ Wound Debridement each add'l 20 sqcm: 2 $PT Hydrotherapy Visit: 1 Visit       Thelma Comp 02/07/2023, 4:03 PM  Rolinda Roan, PT, DPT Acute Rehabilitation Services Secure Chat Preferred Office: [phone]

## 2023-02-08 DIAGNOSIS — R1310 Dysphagia, unspecified: Secondary | ICD-10-CM | POA: Diagnosis not present

## 2023-02-08 DIAGNOSIS — E1169 Type 2 diabetes mellitus with other specified complication: Secondary | ICD-10-CM | POA: Diagnosis not present

## 2023-02-08 DIAGNOSIS — I2510 Atherosclerotic heart disease of native coronary artery without angina pectoris: Secondary | ICD-10-CM | POA: Diagnosis not present

## 2023-02-08 DIAGNOSIS — R531 Weakness: Secondary | ICD-10-CM | POA: Diagnosis not present

## 2023-02-08 DIAGNOSIS — Z7401 Bed confinement status: Secondary | ICD-10-CM | POA: Diagnosis not present

## 2023-02-08 DIAGNOSIS — A419 Sepsis, unspecified organism: Secondary | ICD-10-CM | POA: Diagnosis not present

## 2023-02-08 DIAGNOSIS — L89159 Pressure ulcer of sacral region, unspecified stage: Secondary | ICD-10-CM | POA: Diagnosis not present

## 2023-02-08 DIAGNOSIS — Z48815 Encounter for surgical aftercare following surgery on the digestive system: Secondary | ICD-10-CM | POA: Diagnosis not present

## 2023-02-08 DIAGNOSIS — E039 Hypothyroidism, unspecified: Secondary | ICD-10-CM | POA: Diagnosis not present

## 2023-02-08 DIAGNOSIS — D631 Anemia in chronic kidney disease: Secondary | ICD-10-CM | POA: Diagnosis not present

## 2023-02-08 DIAGNOSIS — D509 Iron deficiency anemia, unspecified: Secondary | ICD-10-CM | POA: Diagnosis not present

## 2023-02-08 DIAGNOSIS — I5042 Chronic combined systolic (congestive) and diastolic (congestive) heart failure: Secondary | ICD-10-CM | POA: Diagnosis not present

## 2023-02-08 DIAGNOSIS — I5022 Chronic systolic (congestive) heart failure: Secondary | ICD-10-CM | POA: Diagnosis not present

## 2023-02-08 DIAGNOSIS — D689 Coagulation defect, unspecified: Secondary | ICD-10-CM | POA: Diagnosis not present

## 2023-02-08 DIAGNOSIS — M4628 Osteomyelitis of vertebra, sacral and sacrococcygeal region: Secondary | ICD-10-CM | POA: Diagnosis not present

## 2023-02-08 DIAGNOSIS — Z992 Dependence on renal dialysis: Secondary | ICD-10-CM | POA: Diagnosis not present

## 2023-02-08 DIAGNOSIS — R6521 Severe sepsis with septic shock: Secondary | ICD-10-CM | POA: Diagnosis not present

## 2023-02-08 DIAGNOSIS — E43 Unspecified severe protein-calorie malnutrition: Secondary | ICD-10-CM | POA: Diagnosis not present

## 2023-02-08 DIAGNOSIS — Z743 Need for continuous supervision: Secondary | ICD-10-CM | POA: Diagnosis not present

## 2023-02-08 DIAGNOSIS — K9189 Other postprocedural complications and disorders of digestive system: Secondary | ICD-10-CM | POA: Diagnosis not present

## 2023-02-08 DIAGNOSIS — E1122 Type 2 diabetes mellitus with diabetic chronic kidney disease: Secondary | ICD-10-CM | POA: Diagnosis not present

## 2023-02-08 DIAGNOSIS — M6281 Muscle weakness (generalized): Secondary | ICD-10-CM | POA: Diagnosis not present

## 2023-02-08 DIAGNOSIS — E440 Moderate protein-calorie malnutrition: Secondary | ICD-10-CM | POA: Diagnosis not present

## 2023-02-08 DIAGNOSIS — R7881 Bacteremia: Secondary | ICD-10-CM | POA: Diagnosis not present

## 2023-02-08 DIAGNOSIS — R2689 Other abnormalities of gait and mobility: Secondary | ICD-10-CM | POA: Diagnosis not present

## 2023-02-08 DIAGNOSIS — Z23 Encounter for immunization: Secondary | ICD-10-CM | POA: Diagnosis not present

## 2023-02-08 DIAGNOSIS — I959 Hypotension, unspecified: Secondary | ICD-10-CM | POA: Diagnosis not present

## 2023-02-08 DIAGNOSIS — N186 End stage renal disease: Secondary | ICD-10-CM | POA: Diagnosis not present

## 2023-02-08 DIAGNOSIS — N2581 Secondary hyperparathyroidism of renal origin: Secondary | ICD-10-CM | POA: Diagnosis not present

## 2023-02-08 LAB — BASIC METABOLIC PANEL
Anion gap: 15 (ref 5–15)
BUN: 66 mg/dL — ABNORMAL HIGH (ref 8–23)
CO2: 24 mmol/L (ref 22–32)
Calcium: 9.8 mg/dL (ref 8.9–10.3)
Chloride: 97 mmol/L — ABNORMAL LOW (ref 98–111)
Creatinine, Ser: 4.24 mg/dL — ABNORMAL HIGH (ref 0.61–1.24)
GFR, Estimated: 14 mL/min — ABNORMAL LOW (ref >60)
Glucose, Bld: 88 mg/dL (ref 70–99)
Potassium: 5.1 mmol/L (ref 3.5–5.1)
Sodium: 136 mmol/L (ref 135–145)

## 2023-02-08 LAB — CBC
HCT: 26.1 % — ABNORMAL LOW (ref 39.0–52.0)
Hemoglobin: 8.3 g/dL — ABNORMAL LOW (ref 13.0–17.0)
MCH: 32 pg (ref 26.0–34.0)
MCHC: 31.8 g/dL (ref 30.0–36.0)
MCV: 100.8 fL — ABNORMAL HIGH (ref 80.0–100.0)
Platelets: 422 10*3/uL — ABNORMAL HIGH (ref 150–400)
RBC: 2.59 MIL/uL — ABNORMAL LOW (ref 4.22–5.81)
RDW: 19.2 % — ABNORMAL HIGH (ref 11.5–15.5)
WBC: 14.4 10*3/uL — ABNORMAL HIGH (ref 4.0–10.5)
nRBC: 0 % (ref 0.0–0.2)

## 2023-02-08 LAB — GLUCOSE, CAPILLARY: Glucose-Capillary: 88 mg/dL (ref 70–99)

## 2023-02-08 MED ORDER — HEPARIN SODIUM (PORCINE) 1000 UNIT/ML DIALYSIS
4000.0000 [IU] | Freq: Once | INTRAMUSCULAR | Status: AC
  Administered 2023-02-08: 4000 [IU] via INTRAVENOUS_CENTRAL
  Filled 2023-02-08 (×2): qty 4

## 2023-02-08 MED ORDER — HEPARIN SODIUM (PORCINE) 1000 UNIT/ML IJ SOLN
INTRAMUSCULAR | Status: AC
  Administered 2023-02-08: 3800 [IU]
  Filled 2023-02-08: qty 8

## 2023-02-08 MED ORDER — CHLORHEXIDINE GLUCONATE CLOTH 2 % EX PADS
6.0000 | MEDICATED_PAD | Freq: Every day | CUTANEOUS | Status: DC
  Administered 2023-02-08: 6 via TOPICAL

## 2023-02-08 NOTE — Progress Notes (Signed)
[facility] Progress Note  Subjective: pt seen in room, getting wound care to the sacral lesion.   Vitals:   02/07/23 1238 02/07/23 1956 02/08/23 0413 02/08/23 0745  BP: 117/66 (!) 143/68 121/63 115/65  Pulse: 100 88 89 91  Resp: 16 20 18 16  $ Temp: 98.9 F (37.2 C) (!) 97.5 F (36.4 C) 99.3 F (37.4 C) 98.4 F (36.9 C)  TempSrc: Oral Axillary Oral Oral  SpO2: 100% 95% 98% 100%  Weight:      Height:        Exam: Gen: Chronically ill/cachectic appearing man CVS: Pulse regular rhythm, normal rate, S1 and S2 normal Resp: Clear to auscultation bilaterally, no distinct rales or rhonchi Abd: Soft, flat, nontender, bowel sounds normal Ext: No lower extremity edema Neuro: gen'd weakness, severe deconditioning, pleasant RIJ TDC in place      OP HD: [facility] MWF 4h   400/800   83kg  3K/2.25Ca bath  R TDC   Heparin 4000 units - last HD 1/31, left 78.6kg - Mircera 225 mcg IV q 2 weeks (due 2/2, last given Aranesp 238mg on 1/26) - no VDRA    Assessment/ Plan: Septic shock: secondary to E. coli bacteremia from sacral decubitus/coccygeal osteomyelitis.  Weaned off of pressors with previous completion of intravenous metronidazole and completion of IV cefepime on 2/15. Plans noted to DC to SNF.  ESRD: MWF HD. Short HD today prior [facility] d[facility]c [facility]  Malfunctioning HD cath: was replaced by IR yesterday after HD. Plan to do short HD today as above.  Anemia: Secondary to chronic illness and exacerbated by acute disease/hospitalization.  ESA dose increased anticipating resistance in the setting of wound/malnutrition. 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 Nutrition: Significant malnutrition noted, likely complicated by sacral decubitus/sepsis.  Continue efforts at protein supplementation and infection/wound management. Hypotension: Off pressors, remains on midodrine to facilitate dialysis.   RKelly SplinterMD  [facility] 02/08/2023, 9:03 AM  Recent Labs  Lab 02/05/23 0927 02/05/23 0928 02/07/23 0836 02/08/23 0416  HGB  --    < > 9.0* 8.3*  ALBUMIN 1.7*  --  1.8*  --   CALCIUM 9.5  --  9.8 9.8  PHOS 4.6  --  5.0*  --   CREATININE 4.43*  --  4.17* 4.24*  K 4.5  --  5.5* 5.1   < > = values in this interval not displayed.   No results for input(s): "IRON", "TIBC", "FERRITIN" in the last 168 hours. Inpatient medications:  (feeding supplement) PROSource Plus  30 mL Oral BID BM   acetaminophen  1,000 mg Oral Q8H   aspirin EC  81 mg Oral Daily   atorvastatin  40 mg Oral Daily   Chlorhexidine Gluconate Cloth  6 each Topical Q0600   darbepoetin (ARANESP) injection - DIALYSIS  200 mcg Subcutaneous Q Fri-1800   feeding supplement  237 mL Oral TID BM   folic acid  1 mg Oral Daily   gabapentin  100 mg Oral TID   heparin  5,000 Units Subcutaneous Q8H   isosorbide dinitrate  5 mg Oral BID   leptospermum manuka honey  1 Application Topical Daily   midodrine  10 mg Oral Q M,W,F-HD   multivitamin  1 tablet Oral QHS   pantoprazole  40 mg Oral Daily   sodium hypochlorite   Irrigation BID    sodium chloride Stopped (02/01/23 2102)   sodium chloride, oxyCODONE

## 2023-02-08 NOTE — Progress Notes (Signed)
Pt discharged to facility with [facility]

## 2023-02-08 NOTE — TOC Transition Note (Signed)
Transition of Care [facility]) - CM/SW Discharge Note   Patient Details  Name: [name] MRN: [id number] Date of Birth: 18-Jan-1946  Transition of Care  [facility]) CM/SW Contact:  Amador Cunas, [place] Phone Number: 02/08/2023, 4:40 PM   Clinical Narrative: Pt for dc to [facility] SNF today. Spoke to Destiny in admissions who confirmed they are prepared to admit pt to room 154-1. Pt's brother aware of dc and reports agreeable. RN provided with number for report and PTAR arranged for transport. SW signing off at dc.   Wandra Feinstein, MSW, LCSW [phone] (coverage)       Final next level of care: Skilled Nursing Facility Barriers to Discharge: No Barriers Identified   Patient Goals and CMS Choice CMS Medicare.gov Compare Post Acute Care list provided to:: Patient Choice offered to / list presented to : Patient  Discharge Placement                Patient chooses bed at: Other - please specify in the comment section below: [facility]) Patient to be transferred to facility by: [place] Name of family member notified: [name]/brother Patient and family notified of of transfer: 02/08/23  Discharge Plan and Services Additional resources added to the After Visit Summary for     Discharge Planning Services: CM Consult Post Acute Care Choice: [place] (SNF versus LTAC)                               Social Determinants of Health (SDOH) Interventions SDOH Screenings   Food Insecurity: No Food Insecurity (01/05/2023)  Housing: [place]  (12/17/2022)  Transportation Needs: No Transportation Needs (12/17/2022)  Utilities: Not At Risk (12/17/2022)  Tobacco Use: Medium Risk (02/07/2023)     Readmission Risk Interventions    01/30/2023    4:23 PM 01/20/2023   10:26 AM 09/16/2022   12:31 PM  Readmission Risk Prevention Plan  Transportation Screening Complete Complete Complete  Medication Review Press photographer) Complete Complete Complete  PCP or  Specialist appointment within 3-5 days of discharge Complete Complete   HRI or Home Care Consult  Complete Complete  SW Recovery Care/Counseling Consult Complete Complete Complete  Palliative Care Screening Complete Complete Not Applicable  [place] Complete Complete Not Applicable

## 2023-02-08 NOTE — Progress Notes (Signed)
   02/08/23 1545  Vitals  BP 121/70  MAP (mmHg) 87  BP Location Right Arm  BP Method Automatic  Patient Position (if appropriate) Lying  Pulse Rate 95  Pulse Rate Source Monitor  ECG Heart Rate 95  Resp 18  Oxygen Therapy  SpO2 100 %  O2 Device Room Air  Patient Activity (if Appropriate) In bed  Pulse Oximetry Type Continuous  During Treatment Monitoring  Intra-Hemodialysis Comments Tx completed  Post Treatment  Dialyzer Clearance Lightly streaked  Duration of HD Treatment -hour(s) 2 hour(s)  Hemodialysis Intake (mL) 0 mL  Liters Processed 41.9  Fluid Removed (mL) 1000 mL  Tolerated HD Treatment Yes  Post-Hemodialysis Comments tx completed---lines reversed--see notes  Note  Observations no complications  Hemodialysis Catheter Right Internal jugular Double lumen Permanent (Tunneled)  Placement Date/Time: 02/07/23 1544   Serial / Lot #: [id number]  Expiration Date: 01/02/27  Time Out: Correct patient;Correct site;Correct procedure  Maximum sterile barrier precautions: Hand hygiene;Cap;Mask;Sterile gown;Sterile gloves;Large sterile ...  Site Condition No complications  Blue Lumen Status Heparin locked;Flushed  Red Lumen Status Heparin locked;Flushed  Purple Lumen Status N/A  Catheter fill solution Heparin 1000 units/ml  Catheter fill volume (Arterial) 1.9 cc  Catheter fill volume (Venous) 1.9  Dressing Type Transparent  Dressing Status Clean, Dry, Intact  Post treatment catheter status Capped and Clamped   Received patient in bed to unit.  Alert and oriented.  Informed consent signed and in chart.   TX duration:2.0 hours  Patient tolerated well.  Transported back to the room  Alert, without acute distress.  Hand-off given to patient's nurse.   Access used: [facility] Access issues: lines reversed to obtain optimal BFR  Total UF removed: 1000 Medication(s) given: none   [facility]

## 2023-02-09 DIAGNOSIS — R531 Weakness: Secondary | ICD-10-CM | POA: Diagnosis not present

## 2023-02-09 DIAGNOSIS — K9189 Other postprocedural complications and disorders of digestive system: Secondary | ICD-10-CM | POA: Diagnosis not present

## 2023-02-09 DIAGNOSIS — N186 End stage renal disease: Secondary | ICD-10-CM | POA: Diagnosis not present

## 2023-02-09 DIAGNOSIS — I5022 Chronic systolic (congestive) heart failure: Secondary | ICD-10-CM | POA: Diagnosis not present

## 2023-02-09 DIAGNOSIS — L89159 Pressure ulcer of sacral region, unspecified stage: Secondary | ICD-10-CM | POA: Diagnosis not present

## 2023-02-10 DIAGNOSIS — Z23 Encounter for immunization: Secondary | ICD-10-CM | POA: Diagnosis not present

## 2023-02-10 DIAGNOSIS — N2581 Secondary hyperparathyroidism of renal origin: Secondary | ICD-10-CM | POA: Diagnosis not present

## 2023-02-10 DIAGNOSIS — D631 Anemia in chronic kidney disease: Secondary | ICD-10-CM | POA: Diagnosis not present

## 2023-02-10 DIAGNOSIS — Z992 Dependence on renal dialysis: Secondary | ICD-10-CM | POA: Diagnosis not present

## 2023-02-10 DIAGNOSIS — D689 Coagulation defect, unspecified: Secondary | ICD-10-CM | POA: Diagnosis not present

## 2023-02-10 DIAGNOSIS — E039 Hypothyroidism, unspecified: Secondary | ICD-10-CM | POA: Diagnosis not present

## 2023-02-10 DIAGNOSIS — E1122 Type 2 diabetes mellitus with diabetic chronic kidney disease: Secondary | ICD-10-CM | POA: Diagnosis not present

## 2023-02-10 DIAGNOSIS — N186 End stage renal disease: Secondary | ICD-10-CM | POA: Diagnosis not present

## 2023-02-12 DIAGNOSIS — D689 Coagulation defect, unspecified: Secondary | ICD-10-CM | POA: Diagnosis not present

## 2023-02-12 DIAGNOSIS — Z23 Encounter for immunization: Secondary | ICD-10-CM | POA: Diagnosis not present

## 2023-02-12 DIAGNOSIS — D631 Anemia in chronic kidney disease: Secondary | ICD-10-CM | POA: Diagnosis not present

## 2023-02-12 DIAGNOSIS — N2581 Secondary hyperparathyroidism of renal origin: Secondary | ICD-10-CM | POA: Diagnosis not present

## 2023-02-12 DIAGNOSIS — E039 Hypothyroidism, unspecified: Secondary | ICD-10-CM | POA: Diagnosis not present

## 2023-02-12 DIAGNOSIS — Z992 Dependence on renal dialysis: Secondary | ICD-10-CM | POA: Diagnosis not present

## 2023-02-12 DIAGNOSIS — N186 End stage renal disease: Secondary | ICD-10-CM | POA: Diagnosis not present

## 2023-02-12 DIAGNOSIS — E1122 Type 2 diabetes mellitus with diabetic chronic kidney disease: Secondary | ICD-10-CM | POA: Diagnosis not present

## 2023-02-12 DIAGNOSIS — I959 Hypotension, unspecified: Secondary | ICD-10-CM | POA: Diagnosis not present

## 2023-02-14 DIAGNOSIS — Z23 Encounter for immunization: Secondary | ICD-10-CM | POA: Diagnosis not present

## 2023-02-14 DIAGNOSIS — D689 Coagulation defect, unspecified: Secondary | ICD-10-CM | POA: Diagnosis not present

## 2023-02-14 DIAGNOSIS — Z992 Dependence on renal dialysis: Secondary | ICD-10-CM | POA: Diagnosis not present

## 2023-02-14 DIAGNOSIS — N2581 Secondary hyperparathyroidism of renal origin: Secondary | ICD-10-CM | POA: Diagnosis not present

## 2023-02-14 DIAGNOSIS — E1122 Type 2 diabetes mellitus with diabetic chronic kidney disease: Secondary | ICD-10-CM | POA: Diagnosis not present

## 2023-02-14 DIAGNOSIS — E039 Hypothyroidism, unspecified: Secondary | ICD-10-CM | POA: Diagnosis not present

## 2023-02-14 DIAGNOSIS — D631 Anemia in chronic kidney disease: Secondary | ICD-10-CM | POA: Diagnosis not present

## 2023-02-14 DIAGNOSIS — N186 End stage renal disease: Secondary | ICD-10-CM | POA: Diagnosis not present

## 2023-02-20 ENCOUNTER — Telehealth: Payer: Self-pay

## 2023-02-20 DIAGNOSIS — E1122 Type 2 diabetes mellitus with diabetic chronic kidney disease: Secondary | ICD-10-CM | POA: Diagnosis not present

## 2023-02-20 DIAGNOSIS — N186 End stage renal disease: Secondary | ICD-10-CM | POA: Diagnosis not present

## 2023-02-20 DIAGNOSIS — Z992 Dependence on renal dialysis: Secondary | ICD-10-CM | POA: Diagnosis not present

## 2023-02-21 NOTE — Telephone Encounter (Signed)
This pt passed today.

## 2023-02-21 DEATH — deceased

## 2023-09-15 IMAGING — US US RENAL
1 series · 14 of 25 positions shown · non-contrast
Comparison: Ultrasound renal 04/04/2021. CT abdomen and pelvis
05/22/2021.

CLINICAL DATA: Acute renal failure with tubular necrosis.

EXAM:
RENAL / URINARY TRACT ULTRASOUND COMPLETE

[Series 1: us renal · 14 of 64 slices shown]
[im 1/64]
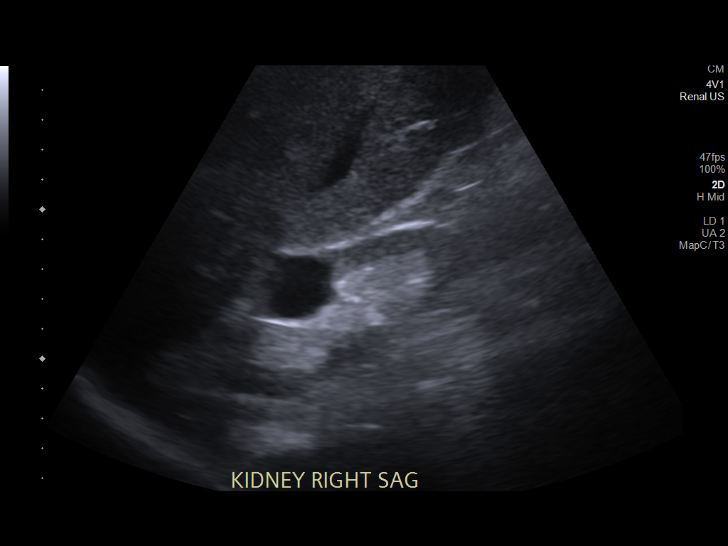
[im 6/64]
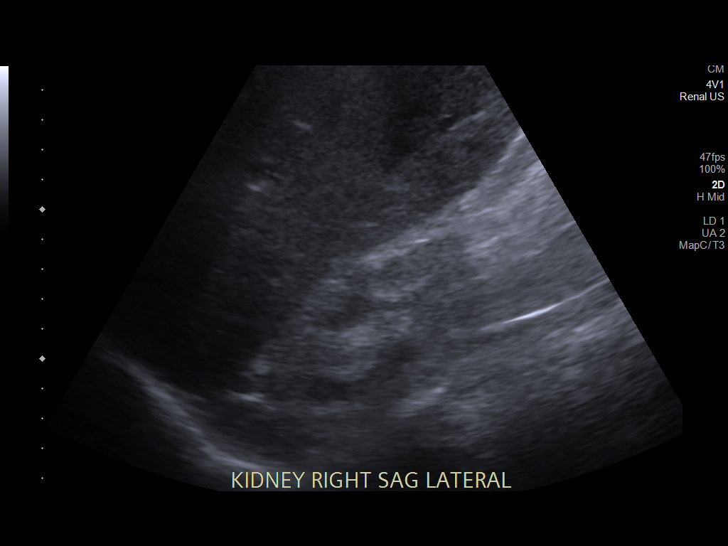
[im 11/64]
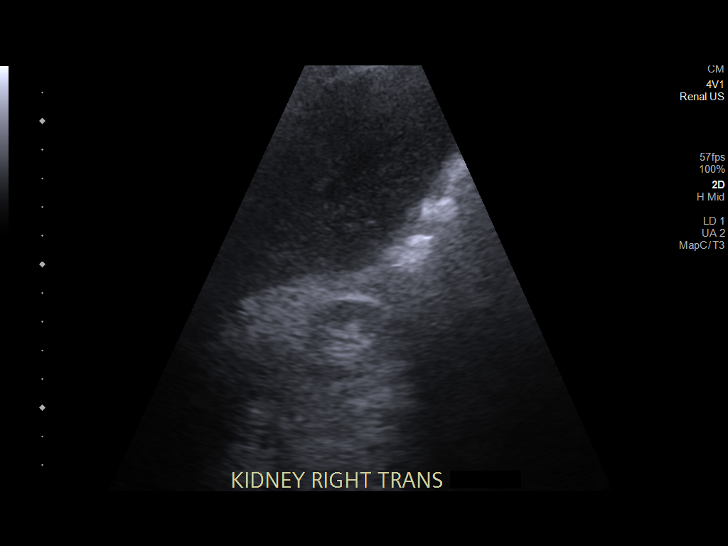
[im 16/64]
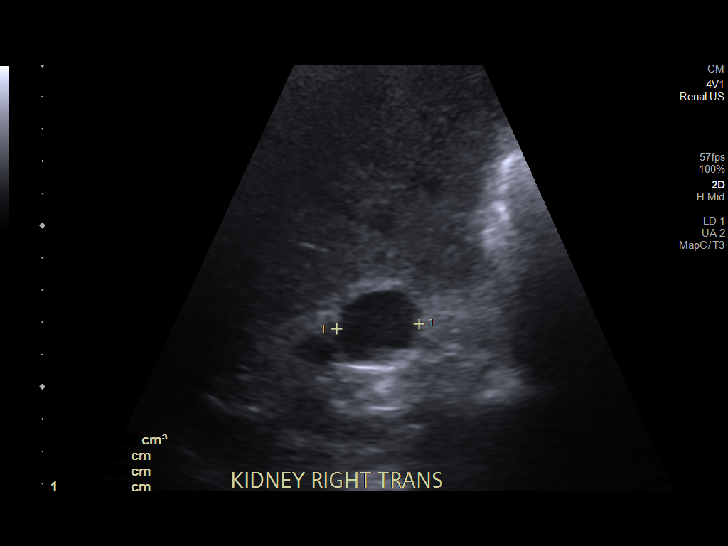
[im 22/64]
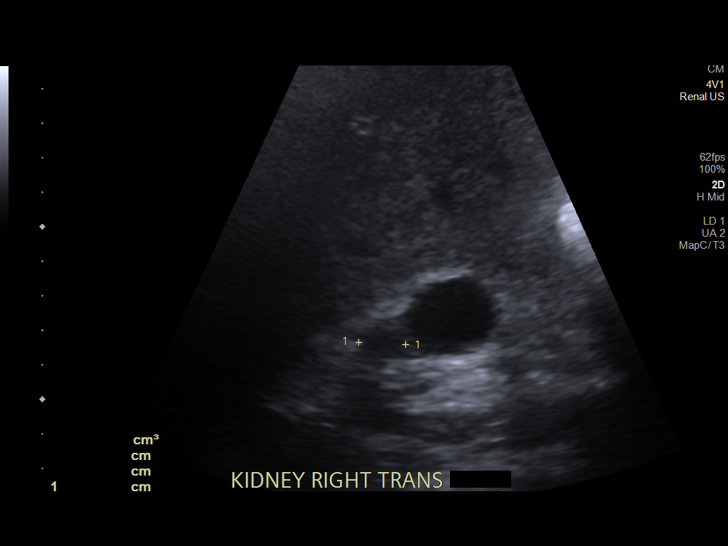
[im 24/64]
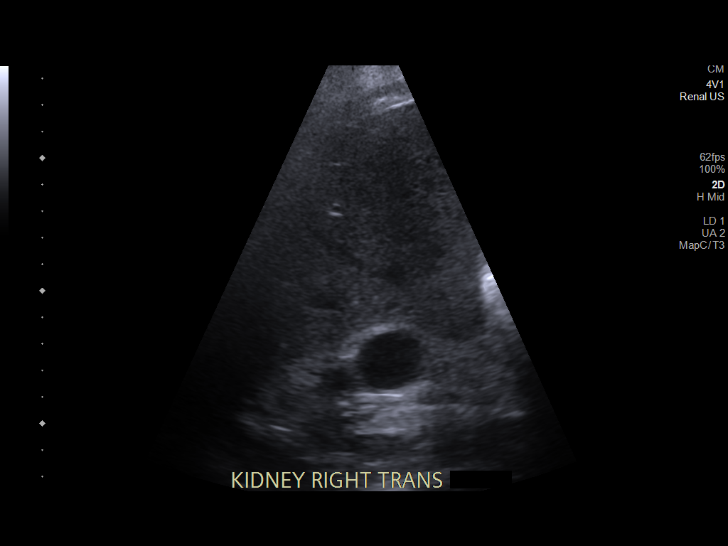
[im 29/64]
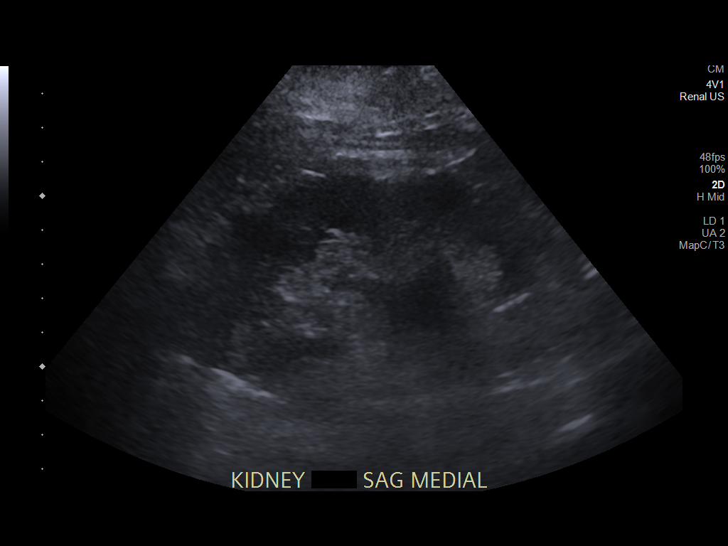
[im 35/64]
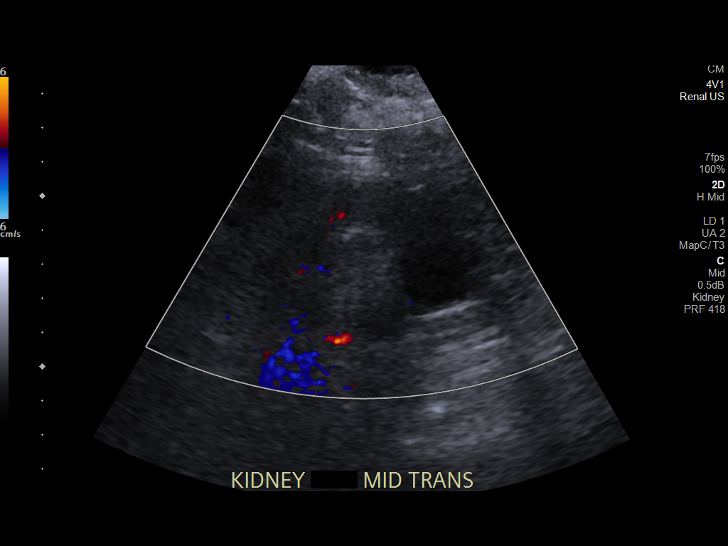
[im 40/64]
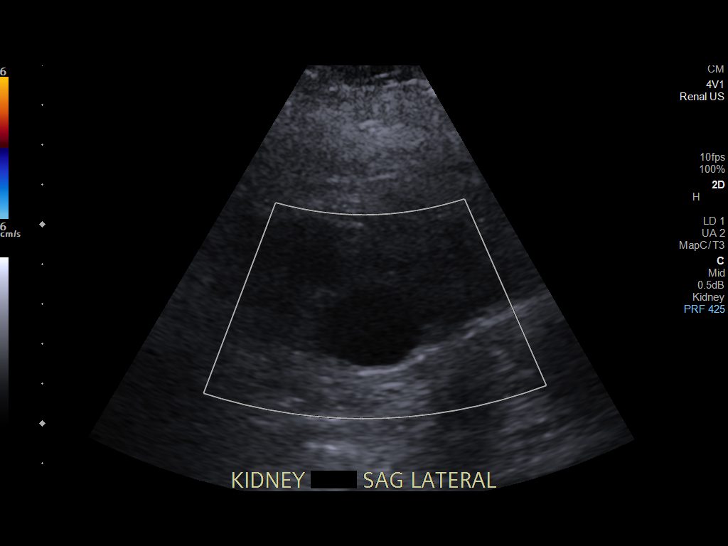
[im 43/64]
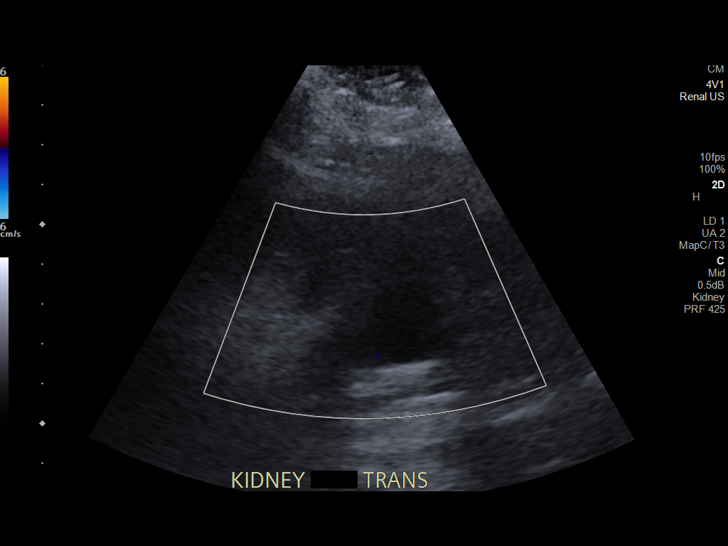
[im 48/64]
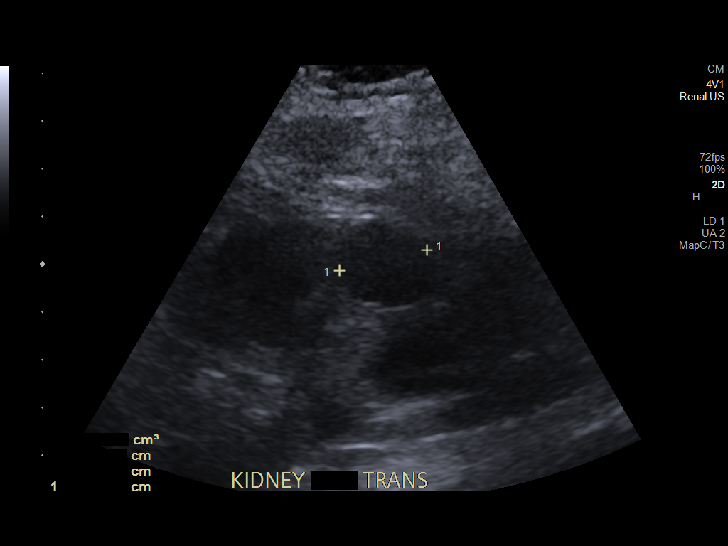
[im 53/64]
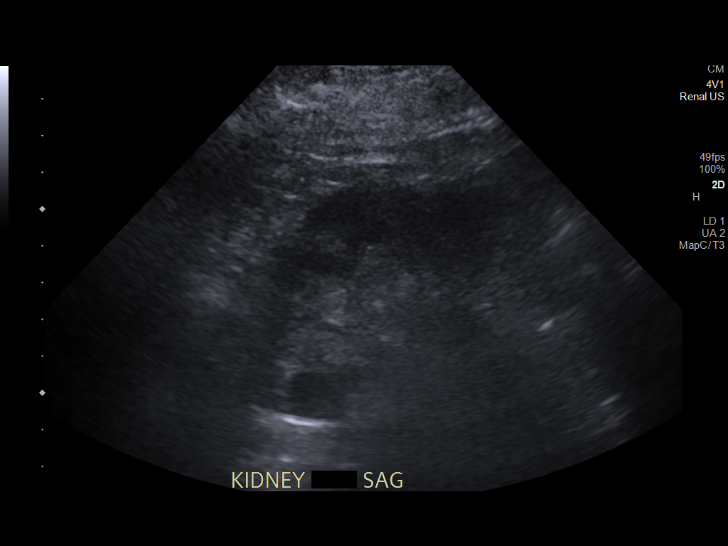
[im 58/64]
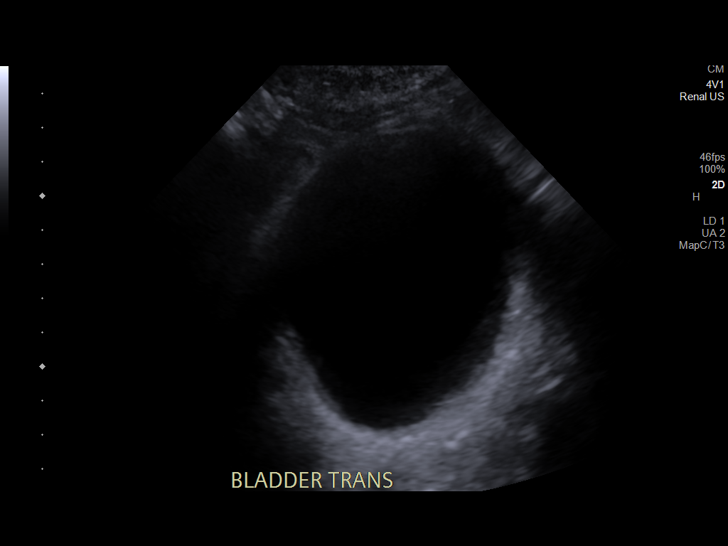
[im 64/64]
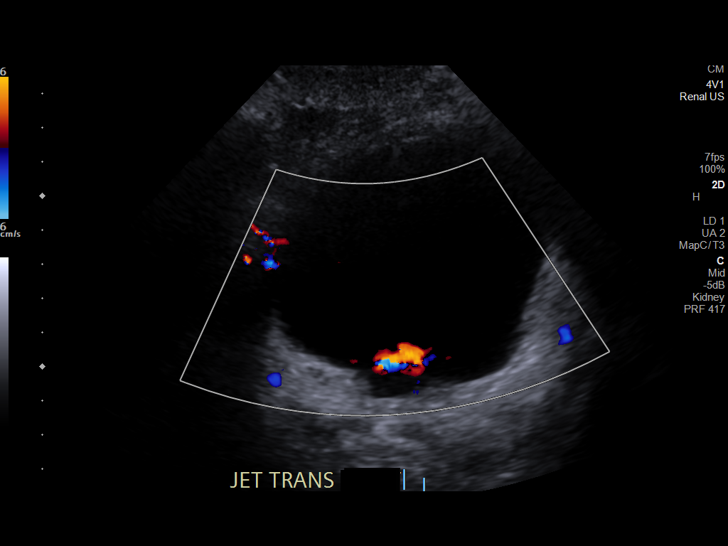

[14 of 25 positions shown; findings below may reference images not displayed]

FINDINGS: Right Kidney:

Renal measurements: 9.6 x 4.4 x 5.1 cm. Echogenicity is increased.
No hydronephrosis. There are 2 cysts in the right kidney. One
measures 3.3 x 2.5 x 2.6 cm. The other measures 1.3 x 1.1 x 1.4 cm.

Left Kidney:

Renal measurements: 10.4 x 5.2 x 4.4 cm = volume: 245 mL.
Echogenicity within normal limits. No hydronephrosis. There are 2
cysts in the left kidney. One measures 2.6 x 2.2 x 2.2 cm. The other
measures 2.4 x 1.7 x 1.9 cm.

Bladder:

Appears normal for degree of bladder distention.

Other:

None.
IMPRESSION: 1. Increased right renal echogenicity may be related to medical
renal disease, although other etiologies are not excluded. This is
not demonstrated in the left kidney.
2. No hydronephrosis.
3. Bilateral renal cysts.

## 2023-12-13 IMAGING — DX DG CHEST 1V PORT
1 series · 1 of 1 positions shown · non-contrast
Comparison: Portable exam 0898 hours compared to 06/29/2021

CLINICAL DATA: Chest pain rated at [DATE] and shortness of breath
beginning today, pain is constant and sharp, history hypertension

EXAM:
PORTABLE CHEST 1 VIEW

[chest ap]
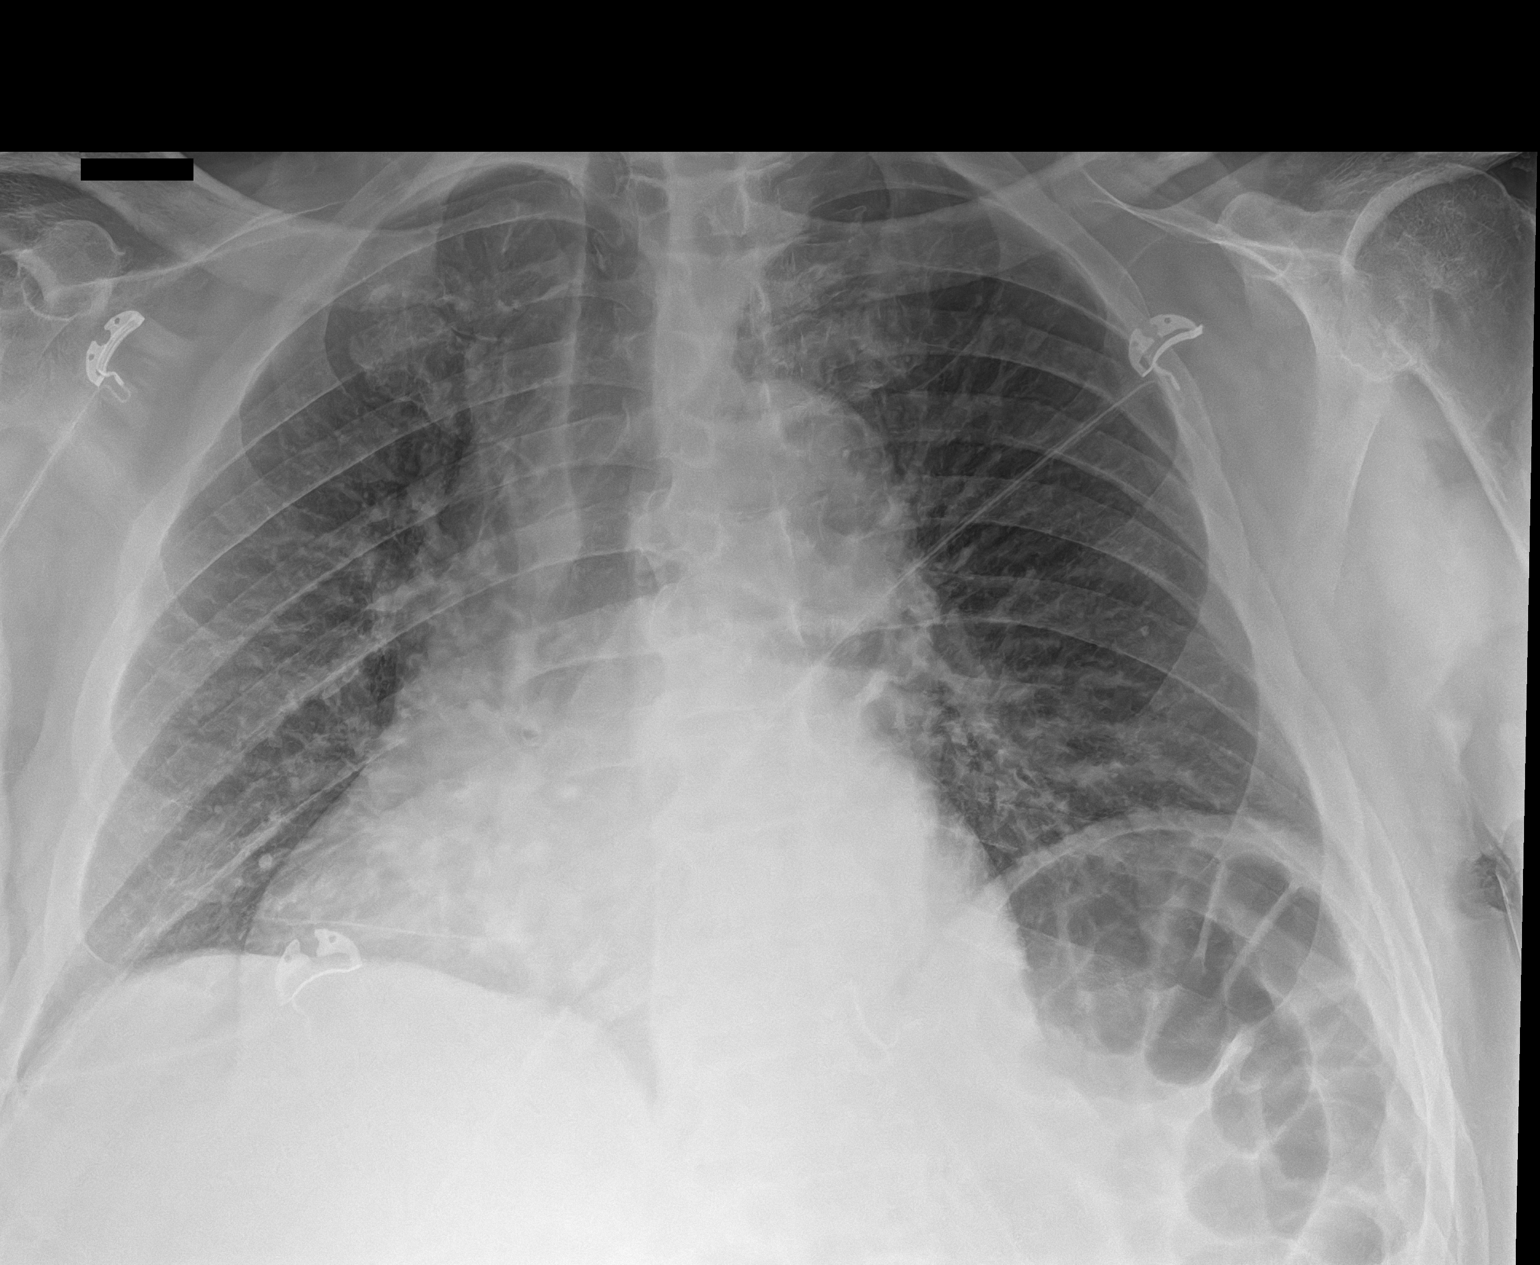

[1 of 1 positions shown; findings below may reference images not displayed]

FINDINGS: Rotated to the RIGHT.

Enlargement of cardiac silhouette.

Mediastinal contours and pulmonary vascularity normal.

Lungs clear.

No pulmonary infiltrate, pleural effusion, or pneumothorax.

Bones demineralized.
IMPRESSION: No acute abnormalities.

Enlargement of cardiac silhouette.
